# Patient Record
Sex: Female | Born: 1950 | Race: White | Hispanic: No | Marital: Single | Smoking: Former smoker
Health system: Southern US, Community
[De-identification: ages and names within clinical notes are randomized; demographics above are authoritative.]

## PROBLEM LIST (undated history)

## (undated) ENCOUNTER — Emergency Department (HOSPITAL_COMMUNITY): Payer: Medicare PPO | Source: Home / Self Care

## (undated) DIAGNOSIS — E785 Hyperlipidemia, unspecified: Secondary | ICD-10-CM

## (undated) DIAGNOSIS — K219 Gastro-esophageal reflux disease without esophagitis: Secondary | ICD-10-CM

## (undated) DIAGNOSIS — R51 Headache: Secondary | ICD-10-CM

## (undated) DIAGNOSIS — I2510 Atherosclerotic heart disease of native coronary artery without angina pectoris: Secondary | ICD-10-CM

## (undated) DIAGNOSIS — E119 Type 2 diabetes mellitus without complications: Secondary | ICD-10-CM

## (undated) DIAGNOSIS — R519 Headache, unspecified: Secondary | ICD-10-CM

## (undated) DIAGNOSIS — M869 Osteomyelitis, unspecified: Secondary | ICD-10-CM

## (undated) DIAGNOSIS — E049 Nontoxic goiter, unspecified: Secondary | ICD-10-CM

## (undated) DIAGNOSIS — G5603 Carpal tunnel syndrome, bilateral upper limbs: Secondary | ICD-10-CM

## (undated) DIAGNOSIS — J45909 Unspecified asthma, uncomplicated: Secondary | ICD-10-CM

## (undated) DIAGNOSIS — G4730 Sleep apnea, unspecified: Secondary | ICD-10-CM

## (undated) DIAGNOSIS — I480 Paroxysmal atrial fibrillation: Secondary | ICD-10-CM

## (undated) DIAGNOSIS — T4145XA Adverse effect of unspecified anesthetic, initial encounter: Secondary | ICD-10-CM

## (undated) DIAGNOSIS — R251 Tremor, unspecified: Secondary | ICD-10-CM

## (undated) DIAGNOSIS — Z8619 Personal history of other infectious and parasitic diseases: Secondary | ICD-10-CM

## (undated) DIAGNOSIS — N189 Chronic kidney disease, unspecified: Secondary | ICD-10-CM

## (undated) DIAGNOSIS — R9431 Abnormal electrocardiogram [ECG] [EKG]: Secondary | ICD-10-CM

## (undated) DIAGNOSIS — I10 Essential (primary) hypertension: Secondary | ICD-10-CM

## (undated) DIAGNOSIS — T8859XA Other complications of anesthesia, initial encounter: Secondary | ICD-10-CM

## (undated) DIAGNOSIS — J189 Pneumonia, unspecified organism: Secondary | ICD-10-CM

## (undated) DIAGNOSIS — M1990 Unspecified osteoarthritis, unspecified site: Secondary | ICD-10-CM

## (undated) DIAGNOSIS — E0590 Thyrotoxicosis, unspecified without thyrotoxic crisis or storm: Secondary | ICD-10-CM

## (undated) DIAGNOSIS — E1142 Type 2 diabetes mellitus with diabetic polyneuropathy: Secondary | ICD-10-CM

## (undated) HISTORY — PX: SHOULDER OPEN ROTATOR CUFF REPAIR: SHX2407

## (undated) HISTORY — DX: Personal history of other infectious and parasitic diseases: Z86.19

## (undated) HISTORY — DX: Abnormal electrocardiogram (ECG) (EKG): R94.31

## (undated) HISTORY — PX: KNEE ARTHROSCOPY: SHX127

## (undated) HISTORY — DX: Tremor, unspecified: R25.1

## (undated) HISTORY — DX: Paroxysmal atrial fibrillation: I48.0

## (undated) HISTORY — DX: Nontoxic goiter, unspecified: E04.9

## (undated) HISTORY — DX: Chronic kidney disease, unspecified: N18.9

## (undated) HISTORY — DX: Essential (primary) hypertension: I10

## (undated) HISTORY — DX: Hyperlipidemia, unspecified: E78.5

## (undated) HISTORY — PX: CARPAL TUNNEL RELEASE: SHX101

## (undated) HISTORY — DX: Unspecified osteoarthritis, unspecified site: M19.90

## (undated) HISTORY — PX: CARDIOVERSION: SHX1299

---

## 1974-05-10 HISTORY — PX: TONSILLECTOMY: SUR1361

## 1986-05-10 HISTORY — PX: ABDOMINAL HYSTERECTOMY: SHX81

## 1998-05-10 HISTORY — PX: THYROID SURGERY: SHX805

## 1998-05-10 HISTORY — PX: FOOT NEUROMA SURGERY: SHX646

## 2013-01-30 ENCOUNTER — Encounter: Payer: Self-pay | Admitting: Family Medicine

## 2013-01-30 ENCOUNTER — Ambulatory Visit: Payer: Self-pay | Admitting: Family Medicine

## 2013-01-30 VITALS — BP 147/77 | HR 82 | Temp 98.7°F | Resp 16 | Ht 64.0 in | Wt 172.0 lb

## 2013-01-30 DIAGNOSIS — Z720 Tobacco use: Secondary | ICD-10-CM

## 2013-01-30 DIAGNOSIS — E1149 Type 2 diabetes mellitus with other diabetic neurological complication: Secondary | ICD-10-CM

## 2013-01-30 DIAGNOSIS — E1140 Type 2 diabetes mellitus with diabetic neuropathy, unspecified: Secondary | ICD-10-CM

## 2013-01-30 DIAGNOSIS — E1151 Type 2 diabetes mellitus with diabetic peripheral angiopathy without gangrene: Secondary | ICD-10-CM | POA: Insufficient documentation

## 2013-01-30 DIAGNOSIS — F17200 Nicotine dependence, unspecified, uncomplicated: Secondary | ICD-10-CM

## 2013-01-30 DIAGNOSIS — I10 Essential (primary) hypertension: Secondary | ICD-10-CM

## 2013-01-30 LAB — POCT GLYCOSYLATED HEMOGLOBIN (HGB A1C): Hemoglobin A1C: 8.9

## 2013-01-30 MED ORDER — ATENOLOL-CHLORTHALIDONE 50-25 MG PO TABS: 1.0000 | ORAL_TABLET | Freq: Every day | ORAL | Status: DC

## 2013-01-30 MED ORDER — BUPROPION HCL ER (SR) 150 MG PO TB12: 150.0000 mg | ORAL_TABLET | Freq: Two times a day (BID) | ORAL | Status: DC

## 2013-01-30 MED ORDER — GLIMEPIRIDE 4 MG PO TABS: ORAL_TABLET | ORAL | Status: DC

## 2013-01-30 MED ORDER — GABAPENTIN 300 MG PO CAPS: 300.0000 mg | ORAL_CAPSULE | Freq: Three times a day (TID) | ORAL | Status: DC

## 2013-01-30 MED ORDER — FLUOXETINE HCL 20 MG PO CAPS: 20.0000 mg | ORAL_CAPSULE | Freq: Every day | ORAL | Status: DC

## 2013-01-30 MED ORDER — METFORMIN HCL 1000 MG PO TABS: 1000.0000 mg | ORAL_TABLET | Freq: Two times a day (BID) | ORAL | Status: DC

## 2013-01-30 MED ORDER — GLIMEPIRIDE 2 MG PO TABS: ORAL_TABLET | ORAL | Status: DC

## 2013-01-30 MED ORDER — TRAMADOL HCL 50 MG PO TABS: 50.0000 mg | ORAL_TABLET | Freq: Three times a day (TID) | ORAL | Status: DC | PRN

## 2013-01-30 NOTE — Progress Notes (Signed)
S:  This 62 y.o. Cauc female is a new pt who moved to [place] from [place] in 2012 or early  2013. She had no health insurance and relied upon previous physician to [facility] to refill medications; that physician (Dr. Marvis Moeller) encouraged pt to establish care with local practice. Pt has Type II DM on Metformin and Insulin; she has been otu of Lantus for several months but she has been using very small doses of short-acting Insulin. FSBS= "high 100s". She had 1 value= 325; this was when she decided not to take medication for 1 day. Nutrition is "okay" but not eating that well due to nausea. She is a chronic smoker and has a prod cough -thick creamy mucous; pt denies fever/chills, chest discomfort, wheezing, HA or weakness. She uses Albuterol MDI prn. Pt works as a Arboriculturist.  PMHx, Soc Hx and Fam Hx reviewed. Medications reconciled.  ROS: As per HPI.  O: Filed Vitals:   01/30/13 1433  BP: 147/77  Pulse: 82  Temp: 98.7 F (37.1 C)  Resp: 16   GEN: in NAD; WN,WD. HENT: [place]/AT; EOMI w/ clear conj/sclerae. EACs/TMs dull/scarred. Post ph erythematous w/o exudate or lesions. NECK: Supple w/o LAN or TMG. COR: RRR; normal S1 and S2. No m/g/r. LUNGS: Distant BS w/o wheezes, rales or rhonchi. SKIN: W&D; intact w/ ruddy facial complexion. No pallor. NEURO: A&O x 3; CNs intact. Nonfocal.  Results for orders placed in visit on 01/30/13  POCT GLYCOSYLATED HEMOGLOBIN (HGB A1C)      Result Value Range   Hemoglobin A1C 8.9       A/P: Type II or unspecified type diabetes mellitus without mention of complication, uncontrolled - Continue Metformin bid; add Glimepiride 2 mg 1/2 tablet bid before meals. Improve nutrition. Plan: POCT glycosylated hemoglobin (Hb A1C)  HTN (hypertension)- Stable; no medication change.  Diabetic neuropathy, painful- Refill medications; advised to stop smoking; reviewed disease processes connected to Diabetes and tobacco use.  Tobacco user- pt will try to cut back.  Meds  ordered this encounter  Medications  . DISCONTD: traMADol (ULTRAM) 50 MG tablet    Sig: Take 50 mg by mouth every 6 (six) hours as needed for pain.  Marland Kitchen DISCONTD: buPROPion (WELLBUTRIN SR) 150 MG 12 hr tablet    Sig: Take 150 mg by mouth 2 (two) times daily.  . cyclobenzaprine (FLEXERIL) 10 MG tablet    Sig: Take 10 mg by mouth 3 (three) times daily as needed for muscle spasms.  Marland Kitchen DISCONTD: gabapentin (NEURONTIN) 300 MG capsule    Sig: Take 300 mg by mouth 3 (three) times daily.  Marland Kitchen DISCONTD: atenolol-chlorthalidone (TENORETIC) 50-25 MG per tablet    Sig: Take 1 tablet by mouth daily.  Marland Kitchen DISCONTD: FLUoxetine (PROZAC) 20 MG capsule    Sig: Take 20 mg by mouth daily.  Marland Kitchen DISCONTD: metFORMIN (GLUCOPHAGE) 1000 MG tablet    Sig: Take 1,000 mg by mouth 2 (two) times daily with a meal.  . atenolol-chlorthalidone (TENORETIC) 50-25 MG per tablet    Sig: Take 1 tablet by mouth daily.    Dispense:  30 tablet    Refill:  3  . buPROPion (WELLBUTRIN SR) 150 MG 12 hr tablet    Sig: Take 1 tablet (150 mg total) by mouth 2 (two) times daily.    Dispense:  60 tablet    Refill:  3  . FLUoxetine (PROZAC) 20 MG capsule    Sig: Take 1 capsule (20 mg total) by mouth daily.    Dispense:  30 capsule    Refill:  3  . gabapentin (NEURONTIN) 300 MG capsule    Sig: Take 1 capsule (300 mg total) by mouth 3 (three) times daily.    Dispense:  90 capsule    Refill:  3  . metFORMIN (GLUCOPHAGE) 1000 MG tablet    Sig: Take 1 tablet (1,000 mg total) by mouth 2 (two) times daily with a meal.    Dispense:  60 tablet    Refill:  3  . DISCONTD: glimepiride (AMARYL) 4 MG tablet    Sig: Take 1/2 tablet twice a day before meals.    Dispense:  30 tablet    Refill:  3  . glimepiride (AMARYL) 2 MG tablet    Sig: Take 1/2 tablet twice a day before meals.    Dispense:  30 tablet    Refill:  3  . traMADol (ULTRAM) 50 MG tablet    Sig: Take 1 tablet (50 mg total) by mouth every 8 (eight) hours as needed for pain.     Dispense:  60 tablet    Refill:  1

## 2013-01-30 NOTE — Patient Instructions (Addendum)
Your Diabetes needs to be a little better controled. Try to eat as healthy as you can and continue taking the medications. I have added Glimepiride 2 mg tablets for you to take before meals to help control your blood sugar; take 1/2 tablet before 2 main meals. Do not use the Insulin unless your sugar is over 250. I will see you again in 4 months for Diabetes check and complete physical exam. All your medications have been refilled. Contact the office if you have any problems.

## 2013-04-18 ENCOUNTER — Other Ambulatory Visit: Payer: Self-pay | Admitting: Family Medicine

## 2013-04-19 NOTE — Telephone Encounter (Signed)
Tramadol refill- prescription printed out and will be available at [facility] for pick up.

## 2013-04-20 ENCOUNTER — Telehealth: Payer: Self-pay

## 2013-04-20 NOTE — Telephone Encounter (Signed)
Pt faxed note to Dr Audria Nine checking on RF of Tramadol she had requested through pharm. Our records showed that it was sent yesterday, but called pharm and they did not have a record of the Rx. I gave RF info over the phone and [facility] for pt that was done.

## 2013-05-29 ENCOUNTER — Other Ambulatory Visit: Payer: Self-pay | Admitting: Family Medicine

## 2013-05-30 NOTE — Telephone Encounter (Signed)
Tramadol HCl 50 mg  #60 w/ no refills phoned to pt's pharmacy.

## 2013-06-01 ENCOUNTER — Encounter: Payer: Self-pay | Admitting: Family Medicine

## 2013-06-01 ENCOUNTER — Ambulatory Visit (INDEPENDENT_AMBULATORY_CARE_PROVIDER_SITE_OTHER): Payer: BC Managed Care – PPO | Admitting: Family Medicine

## 2013-06-01 VITALS — BP 138/80 | HR 68 | Temp 97.9°F | Resp 16 | Ht 63.5 in | Wt 164.4 lb

## 2013-06-01 DIAGNOSIS — F418 Other specified anxiety disorders: Secondary | ICD-10-CM | POA: Insufficient documentation

## 2013-06-01 DIAGNOSIS — F341 Dysthymic disorder: Secondary | ICD-10-CM

## 2013-06-01 DIAGNOSIS — E1165 Type 2 diabetes mellitus with hyperglycemia: Secondary | ICD-10-CM

## 2013-06-01 DIAGNOSIS — IMO0001 Reserved for inherently not codable concepts without codable children: Secondary | ICD-10-CM

## 2013-06-01 DIAGNOSIS — Z0000 Encounter for general adult medical examination without abnormal findings: Secondary | ICD-10-CM

## 2013-06-01 DIAGNOSIS — I10 Essential (primary) hypertension: Secondary | ICD-10-CM

## 2013-06-01 DIAGNOSIS — Z8619 Personal history of other infectious and parasitic diseases: Secondary | ICD-10-CM

## 2013-06-01 DIAGNOSIS — Z1231 Encounter for screening mammogram for malignant neoplasm of breast: Secondary | ICD-10-CM

## 2013-06-01 HISTORY — DX: Personal history of other infectious and parasitic diseases: Z86.19

## 2013-06-01 LAB — CBC WITH DIFFERENTIAL/PLATELET
BASOS ABS: 0.1 10*3/uL (ref 0.0–0.1)
Basophils Relative: 1 % (ref 0–1)
EOS ABS: 0.3 10*3/uL (ref 0.0–0.7)
Eosinophils Relative: 3 % (ref 0–5)
HCT: 38.1 % (ref 36.0–46.0)
Hemoglobin: 13 g/dL (ref 12.0–15.0)
LYMPHS PCT: 30 % (ref 12–46)
Lymphs Abs: 2.5 10*3/uL (ref 0.7–4.0)
MCH: 28.4 pg (ref 26.0–34.0)
MCHC: 34.1 g/dL (ref 30.0–36.0)
MCV: 83.2 fL (ref 78.0–100.0)
Monocytes Absolute: 0.5 10*3/uL (ref 0.1–1.0)
Monocytes Relative: 6 % (ref 3–12)
NEUTROS PCT: 60 % (ref 43–77)
Neutro Abs: 5.1 10*3/uL (ref 1.7–7.7)
PLATELETS: 426 10*3/uL — AB (ref 150–400)
RBC: 4.58 MIL/uL (ref 3.87–5.11)
RDW: 14.1 % (ref 11.5–15.5)
WBC: 8.5 10*3/uL (ref 4.0–10.5)

## 2013-06-01 LAB — POCT URINALYSIS DIPSTICK
GLUCOSE UA: NEGATIVE
Leukocytes, UA: NEGATIVE
Nitrite, UA: NEGATIVE
PROTEIN UA: 100
Spec Grav, UA: 1.02
Urobilinogen, UA: 1
pH, UA: 7.5

## 2013-06-01 LAB — POCT GLYCOSYLATED HEMOGLOBIN (HGB A1C): HEMOGLOBIN A1C: 8.8

## 2013-06-01 LAB — IFOBT (OCCULT BLOOD): IMMUNOLOGICAL FECAL OCCULT BLOOD TEST: POSITIVE

## 2013-06-01 MED ORDER — GLIMEPIRIDE 2 MG PO TABS: ORAL_TABLET | ORAL | Status: DC

## 2013-06-01 MED ORDER — ATENOLOL-CHLORTHALIDONE 50-25 MG PO TABS: ORAL_TABLET | ORAL | Status: DC

## 2013-06-01 MED ORDER — GABAPENTIN 300 MG PO CAPS: ORAL_CAPSULE | ORAL | Status: DC

## 2013-06-01 MED ORDER — BUPROPION HCL ER (SR) 150 MG PO TB12: ORAL_TABLET | ORAL | Status: DC

## 2013-06-01 MED ORDER — METFORMIN HCL 1000 MG PO TABS: ORAL_TABLET | ORAL | Status: DC

## 2013-06-01 MED ORDER — FLUOXETINE HCL 20 MG PO CAPS: ORAL_CAPSULE | ORAL | Status: DC

## 2013-06-01 MED ORDER — TRAMADOL HCL 50 MG PO TABS: ORAL_TABLET | ORAL | Status: DC

## 2013-06-01 MED ORDER — ZOSTER VACCINE LIVE 19400 UNT/0.65ML ~~LOC~~ SOLR: 0.6500 mL | Freq: Once | SUBCUTANEOUS | Status: DC

## 2013-06-01 NOTE — Progress Notes (Signed)
Subjective:    Patient ID: Judith [name], female    DOB: [birth year], 63 y.o.   MRN: [id number]  HPI  This 63 y.o. 47 female is here for CPE; chronic medical problems include Type II DM, HTN, depression/anxiety and neuropathy. She states she feels so much better since last visit when medications were resumed. Pt is compliant w/ all meds. She has a hx of  3 episodes of Shingles; now she has a persistent mild tingling to L of spine. Has never received the vaccine.  HCM: PAP- s/p TAH for benign reasons           MMG- 2011 or 2012 (negative in [place], [place]).           CRS- Current; has polyps.           Vision- 2014; wears corrective lenses.  Patient Active Problem List   Diagnosis Date Noted  . History of shingles 06/01/2013  . Depression with anxiety 06/01/2013  . Type II or unspecified type diabetes mellitus without mention of complication, uncontrolled 01/30/2013  . HTN (hypertension) 01/30/2013  . Diabetic neuropathy, painful 01/30/2013  . Tobacco user 01/30/2013   PMHx, Surg Hx, Soc and Fam hx reviewed.  MEDICATIONS reconciled.  Review of Systems  Constitutional: Negative.   HENT: Positive for dental problem, mouth sores and trouble swallowing. Negative for hearing loss and voice change.        Pt attributes weight loss to dental problems and trouble chewing.  Eyes: Negative.   Respiratory: Negative.   Cardiovascular: Negative.        Pt c/o heartburn.  Gastrointestinal: Negative.   Endocrine: Negative for polydipsia, polyphagia and polyuria.       Has a goiter.  Genitourinary: Negative.        Vaginal discomfort due to dryness.  Musculoskeletal: Negative.   Skin: Negative.   Allergic/Immunologic: Negative.   Neurological: Negative.   Hematological: Negative.   Psychiatric/Behavioral: Negative for suicidal ideas, behavioral problems, sleep disturbance, dysphoric mood, decreased concentration and agitation. The patient is nervous/anxious.       Objective:   Physical  Exam  Nursing note and vitals reviewed. Constitutional: She is oriented to person, place, and time. Vital signs are normal. She appears well-developed and well-nourished. No distress.  HENT:  Head: Normocephalic and atraumatic.  Right Ear: Hearing, tympanic membrane, external ear and ear canal normal.  Left Ear: Hearing, tympanic membrane, external ear and ear canal normal.  Nose: Nose normal. No nasal deformity or septal deviation.  Mouth/Throat: Uvula is midline, oropharynx is clear and moist and mucous membranes are normal. No oral lesions. Abnormal dentition. No dental abscesses.  Eyes: EOM and lids are normal. Pupils are equal, round, and reactive to light. Right conjunctiva is not injected. Left conjunctiva is injected. No scleral icterus.  L> R eye- infraorbital area is puffy; L conjunctival area is erythematous.  Neck: Trachea normal, normal range of motion and full passive range of motion without pain. Neck supple. No JVD present. No spinous process tenderness and no muscular tenderness present. Carotid bruit is not present. Thyromegaly present. No mass present.  Cardiovascular: Normal rate, regular rhythm, S1 normal, S2 normal, normal heart sounds and intact distal pulses.   No extrasystoles are present. PMI is not displaced.  Exam reveals no gallop, no distant heart sounds and no friction rub.   No murmur heard. Pulmonary/Chest: Effort normal. No respiratory distress. She has decreased breath sounds. She has no wheezes. She has no rhonchi. She has no  rales. Right breast exhibits no inverted nipple, no mass, no nipple discharge, no skin change and no tenderness. Left breast exhibits no inverted nipple, no mass, no nipple discharge, no skin change and no tenderness. Breasts are symmetrical.  Decreased BS at bases. Barrel chest shape. Atrophic breast tissue.  Abdominal: Soft. Normal appearance, normal aorta and bowel sounds are normal. She exhibits no distension, no abdominal bruit, no  pulsatile midline mass and no mass. There is no hepatosplenomegaly. There is no tenderness. There is no guarding and no CVA tenderness. No hernia.  Genitourinary: Rectal exam shows external hemorrhoid. Rectal exam shows no fissure, no mass, no tenderness and anal tone normal. There is no rash, tenderness or lesion on the right labia. There is no rash, tenderness or lesion on the left labia.  Lymphadenopathy:       Head (right side): No submental, no submandibular, no tonsillar, no preauricular, no posterior auricular and no occipital adenopathy present.       Head (left side): No submental, no submandibular, no tonsillar, no preauricular, no posterior auricular and no occipital adenopathy present.    She has no cervical adenopathy.    She has no axillary adenopathy.       Right: No inguinal and no supraclavicular adenopathy present.       Left: No inguinal and no supraclavicular adenopathy present.  Neurological: She is alert and oriented to person, place, and time. She has normal strength and normal reflexes. She displays no atrophy and no tremor. No cranial nerve deficit or sensory deficit. She exhibits normal muscle tone. Coordination and gait normal.  Skin: Skin is warm, dry and intact. No ecchymosis, no lesion and no rash noted. She is not diaphoretic. No cyanosis or erythema. No pallor.  Psychiatric: Her speech is normal and behavior is normal. Judgment and thought content normal. Her mood appears anxious. Her affect is not labile and not inappropriate. Cognition and memory are normal. She does not exhibit a depressed mood.    ECG: NSR; no ST-TW changes . No ectopy.  Results for orders placed in visit on 06/01/13  POCT URINALYSIS DIPSTICK      Result Value Range   Color, UA yellow     Clarity, UA clear     Glucose, UA neg     Bilirubin, UA small     Ketones, UA trace     Spec Grav, UA 1.020     Blood, UA trace     pH, UA 7.5     Protein, UA 100     Urobilinogen, UA 1.0     Nitrite,  UA neg     Leukocytes, UA Negative    IFOBT (OCCULT BLOOD)      Result Value Range   IFOBT Positive    POCT GLYCOSYLATED HEMOGLOBIN (HGB A1C)      Result Value Range   Hemoglobin A1C 8.8        Assessment & Plan:  Routine general medical examination at a health care facility - Plan: POCT urinalysis dipstick, IFOBT POC (occult bld, rslt in office), Hepatitis C antibody, CBC with Differential, T4, Free, EKG 12-Lead, Lipid panel  Type II or unspecified type diabetes mellitus without mention of complication, uncontrolled - Continue current medications; pt will focus on nutrition and physical activity. Plan: POCT glycosylated hemoglobin (Hb A1C), COMPLETE METABOLIC PANEL WITH GFR  HTN (hypertension) - Stable on current medication. Continue same. Plan: TSH  Depression with anxiety- Stable on current medications; continue same.  History of shingles -  Plan: T4, Free, zoster vaccine live, PF, (ZOSTAVAX) 60454 UNT/0.65ML injection  Other screening mammogram - Plan: MM Digital Screening  Meds ordered this encounter  Medications  . atenolol-chlorthalidone (TENORETIC) 50-25 MG per tablet    Sig: TAKE ONE TABLET BY MOUTH ONCE DAILY    Dispense:  30 tablet    Refill:  5  . buPROPion (WELLBUTRIN SR) 150 MG 12 hr tablet    Sig: TAKE ONE TABLET BY MOUTH TWICE DAILY    Dispense:  60 tablet    Refill:  5  . FLUoxetine (PROZAC) 20 MG capsule    Sig: TAKE ONE CAPSULE BY MOUTH ONCE DAILY    Dispense:  30 capsule    Refill:  5  . gabapentin (NEURONTIN) 300 MG capsule    Sig: TAKE ONE CAPSULE BY MOUTH THREE TIMES DAILY    Dispense:  90 capsule    Refill:  5  . glimepiride (AMARYL) 2 MG tablet    Sig: Take 1/2 tablet twice a day before meals.    Dispense:  30 tablet    Refill:  5  . metFORMIN (GLUCOPHAGE) 1000 MG tablet    Sig: TAKE ONE TABLET BY MOUTH TWICE DAILY WITH MEALS    Dispense:  60 tablet    Refill:  5  . traMADol (ULTRAM) 50 MG tablet    Sig: TAKE ONE TABLET BY MOUTH EVERY 8  HOURS AS NEEDED FOR PAIN    Dispense:  60 tablet    Refill:  1  . zoster vaccine live, PF, (ZOSTAVAX) 09811 UNT/0.65ML injection    Sig: Inject 19,400 Units into the skin once.    Dispense:  1 each    Refill:  0

## 2013-06-01 NOTE — Patient Instructions (Signed)
Keeping You Healthy  Get These Tests  Blood Pressure- Have your blood pressure checked by your healthcare provider at least once a year.  Normal blood pressure is 120/80.  Weight- Have your body mass index (BMI) calculated to screen for obesity.  BMI is a measure of body fat based on height and weight.  You can calculate your own BMI at www.nhlbisupport.com/bmi/  Cholesterol- Have your cholesterol checked every year.  Diabetes- Have your blood sugar checked every year if you have high blood pressure, high cholesterol, a family history of diabetes or if you are overweight.  Pap Smear- Have a pap smear every 1 to 3 years if you have been sexually active.  If you are older than 65 and recent pap smears have been normal you may not need additional pap smears.  In addition, if you have had a hysterectomy  For benign disease additional pap smears are not necessary.  Mammogram-Yearly mammograms are essential for early detection of breast cancer  Screening for Colon Cancer- Colonoscopy starting at age 50. Screening may begin sooner depending on your family history and other health conditions.  Follow up colonoscopy as directed by your Gastroenterologist.  Screening for Osteoporosis- Screening begins at age 65 with bone density scanning, sooner if you are at higher risk for developing Osteoporosis.  Get these medicines  Calcium with Vitamin D- Your body requires 1200-1500 mg of Calcium a day and 800-1000 IU of Vitamin D a day.  You can only absorb 500 mg of Calcium at a time therefore Calcium must be taken in 2 or 3 separate doses throughout the day.  Hormones- Hormone therapy has been associated with increased risk for certain cancers and heart disease.  Talk to your healthcare provider about if you need relief from menopausal symptoms.  Aspirin- Ask your healthcare provider about taking Aspirin to prevent Heart Disease and Stroke.  Get these Immuniztions  Flu shot- Every fall  Pneumonia  shot- Once after the age of 65; if you are younger ask your healthcare provider if you need a pneumonia shot.  Tetanus- Every ten years.  Zostavax- Once after the age of 60 to prevent shingles.  Take these steps  Don't smoke- Your healthcare provider can help you quit. For tips on how to quit, ask your healthcare provider or go to www.smokefree.gov or call 1-800 QUIT-NOW.  Be physically active- Exercise 5 days a week for a minimum of 30 minutes.  If you are not already physically active, start slow and gradually work up to 30 minutes of moderate physical activity.  Try walking, dancing, bike riding, swimming, etc.  Eat a healthy diet- Eat a variety of healthy foods such as fruits, vegetables, whole grains, low fat milk, low fat cheeses, yogurt, lean meats, chicken, fish, eggs, dried beans, tofu, etc.  For more information go to www.thenutritionsource.org  Dental visit- Brush and floss teeth twice daily; visit your dentist twice a year.  Eye exam- Visit your Optometrist or Ophthalmologist yearly.  Drink alcohol in moderation- Limit alcohol intake to one drink or less a day.  Never drink and drive.  Depression- Your emotional health is as important as your physical health.  If you're feeling down or losing interest in things you normally enjoy, please talk to your healthcare provider.  Seat Belts- can save your life; always wear one  Smoke/Carbon Monoxide detectors- These detectors need to be installed on the appropriate level of your home.  Replace batteries at least once a year.  Violence- If anyone   is threatening or hurting you, please tell your healthcare provider.  Living Will/ Health care power of attorney- Discuss with your healthcare provider and family. 

## 2013-06-02 LAB — COMPLETE METABOLIC PANEL WITH GFR
ALT: 8 U/L (ref 0–35)
AST: 10 U/L (ref 0–37)
Albumin: 4 g/dL (ref 3.5–5.2)
Alkaline Phosphatase: 85 U/L (ref 39–117)
BILIRUBIN TOTAL: 0.4 mg/dL (ref 0.3–1.2)
BUN: 22 mg/dL (ref 6–23)
CHLORIDE: 98 meq/L (ref 96–112)
CO2: 31 mEq/L (ref 19–32)
CREATININE: 0.79 mg/dL (ref 0.50–1.10)
Calcium: 9.3 mg/dL (ref 8.4–10.5)
GFR, Est African American: >89 mL/min
GFR, Est Non African American: 82 mL/min
Glucose, Bld: 124 mg/dL — ABNORMAL HIGH (ref 70–99)
Potassium: 4.4 mEq/L (ref 3.5–5.3)
Sodium: 138 mEq/L (ref 135–145)
Total Protein: 7.2 g/dL (ref 6.0–8.3)

## 2013-06-02 LAB — LIPID PANEL
Cholesterol: 190 mg/dL (ref 0–200)
HDL: 35 mg/dL — AB (ref >39)
LDL CALC: 83 mg/dL (ref 0–99)
Total CHOL/HDL Ratio: 5.4 Ratio
Triglycerides: 358 mg/dL — ABNORMAL HIGH (ref <150)
VLDL: 72 mg/dL — AB (ref 0–40)

## 2013-06-02 LAB — TSH: TSH: 0.749 u[IU]/mL (ref 0.350–4.500)

## 2013-06-02 LAB — T4, FREE: FREE T4: 1.27 ng/dL (ref 0.80–1.80)

## 2013-06-02 LAB — HEPATITIS C ANTIBODY: HCV Ab: NEGATIVE

## 2013-06-05 NOTE — Progress Notes (Signed)
Quick Note:  Please advise pt regarding following labs...  Lipid panel-- Total cholesterol is normal. Triglycerides are too high and related to uncontrolled Diabetes. Improved nutrition would bring the triglycerides down; THE MEDITERRANEAN DIET is a good guide for Heart Healthy eating. Staying active will help raise HDL ("good") cholesterol.  Thyroid blood tests are normal. Complete blood counts are normal. You do not have the antibody for Hepatitis C. Sodium, potassium, calcium, kidney and liver tests are all normal.   Copy to pt. ______

## 2013-06-10 ENCOUNTER — Telehealth: Payer: Self-pay

## 2013-06-10 NOTE — Telephone Encounter (Signed)
PT says she thinks we have been calling her.  She has had three missed calls, but does not know how to check her messages.  Please call [phone]

## 2013-07-04 ENCOUNTER — Ambulatory Visit (INDEPENDENT_AMBULATORY_CARE_PROVIDER_SITE_OTHER): Payer: BC Managed Care – PPO | Admitting: Family Medicine

## 2013-07-04 VITALS — BP 154/92 | HR 90 | Temp 98.3°F | Resp 18 | Ht 64.0 in | Wt 169.0 lb

## 2013-07-04 DIAGNOSIS — R6884 Jaw pain: Secondary | ICD-10-CM

## 2013-07-04 DIAGNOSIS — I209 Angina pectoris, unspecified: Secondary | ICD-10-CM

## 2013-07-04 DIAGNOSIS — R21 Rash and other nonspecific skin eruption: Secondary | ICD-10-CM

## 2013-07-04 DIAGNOSIS — L299 Pruritus, unspecified: Secondary | ICD-10-CM

## 2013-07-04 MED ORDER — TRIAMCINOLONE ACETONIDE 0.1 % EX CREA: 1.0000 "application " | TOPICAL_CREAM | Freq: Two times a day (BID) | CUTANEOUS | Status: DC

## 2013-07-04 NOTE — Patient Instructions (Signed)
Try an OTC mouth guard at night for your jaw.  Let me know if it is getting worse!  We will refer you to cardiology to further evaluate your angina.  If you have any worsening or other changes in your angina please me know  You may use the triamcinolone cream twice a day as needed- ok to mix with Eucerin or another moisturizer creams.    Let me know if you are not better with your rash or jaw pain in about one week- Sooner if worse. We can try some prednisone for the rash if we have to

## 2013-07-04 NOTE — Progress Notes (Signed)
[facility] [address] [place] [address] [phone]  Date:  07/04/2013   Name:  Judith [name]   DOB:  05-18-1950   MRN:  [id number]  PCP:  No primary provider on file.    Chief Complaint: Rash and Facial Pain   History of Present Illness:  Judith [name] is a 63 y.o. very pleasant female patient who presents with the following:  She has noted an itchy rash on her trunk and scalp which has been present for about 3 weeks.   She has had this intermittently for about 15 years.  Usually it is more mild and will go away.  She has had a punch bx a couple of times in the times but never had a definite diagnosis.  It is itchy and she is using benadryl- she uses it at night.   She did get some fluocinonide from her sister which does not seem to help  She also notes pain in her left face and jaw- it will go away if she sits still.  It may occur several times a day.  Changing temperature (like going inside to outside) or eating or drinking causes the pain.  She does not feel that she has definite sinus congestion, no fever, no nasal discharge.  The pain does not wake her up but she does take gabapetin and tramadol before bed.    Her most recent A1c was 8.8  She does have a history of possible angina.  She uses nitrogylcerin prn.  2 years ago she "was having the worst indigestion of all time." She went to the ER and was diagnosed with angina- however she had a myoview and it was ok.  She did not have a cardiac cath. She is not currently seeing a cardiologist  Patient Active Problem List   Diagnosis Date Noted  . History of shingles 06/01/2013  . Depression with anxiety 06/01/2013  . Type II or unspecified type diabetes mellitus without mention of complication, uncontrolled 01/30/2013  . HTN (hypertension) 01/30/2013  . Diabetic neuropathy, painful 01/30/2013  . Tobacco user 01/30/2013    Past Medical History  Diagnosis Date  . Diabetes mellitus without  complication   . Hypertension   . Goiter   . Chronic kidney disease   . Allergy   . Arthritis   . Neuropathy     Past Surgical History  Procedure Laterality Date  . Tonsillectomy    . Cesarean section    . Abdominal hysterectomy    . Carpal tunnel release    . Right knee    . Right shoulder    . Foot neuroma surgery      History  Substance Use Topics  . Smoking status: Current Every Day Smoker  . Smokeless tobacco: Not on file  . Alcohol Use: No    Family History  Problem Relation Age of Onset  . Cancer Mother     bronchial cancer  . Hypertension Father   . COPD Father   . Allergies Sister   . Breast cancer Maternal Grandmother   . Emphysema Maternal Grandfather   . Leukemia Paternal Grandmother   . Emphysema Paternal Grandfather     Allergies  Allergen Reactions  . Iodine   . Neosporin [Neomycin-Bacitracin Zn-Polymyx]   . Penicillins   . Tape     Medication list has been reviewed and updated.  Current Outpatient Prescriptions on File Prior to Visit  Medication Sig Dispense Refill  . atenolol-chlorthalidone (TENORETIC)  50-25 MG per tablet TAKE ONE TABLET BY MOUTH ONCE DAILY  30 tablet  5  . buPROPion (WELLBUTRIN SR) 150 MG 12 hr tablet TAKE ONE TABLET BY MOUTH TWICE DAILY  60 tablet  5  . cyclobenzaprine (FLEXERIL) 10 MG tablet Take 10 mg by mouth 3 (three) times daily as needed for muscle spasms.      Marland Kitchen FLUoxetine (PROZAC) 20 MG capsule TAKE ONE CAPSULE BY MOUTH ONCE DAILY  30 capsule  5  . gabapentin (NEURONTIN) 300 MG capsule TAKE ONE CAPSULE BY MOUTH THREE TIMES DAILY  90 capsule  5  . glimepiride (AMARYL) 2 MG tablet Take 1/2 tablet twice a day before meals.  30 tablet  5  . metFORMIN (GLUCOPHAGE) 1000 MG tablet TAKE ONE TABLET BY MOUTH TWICE DAILY WITH MEALS  60 tablet  5  . traMADol (ULTRAM) 50 MG tablet TAKE ONE TABLET BY MOUTH EVERY 8 HOURS AS NEEDED FOR PAIN  60 tablet  1  . zoster vaccine live, PF, (ZOSTAVAX) 74081 UNT/0.65ML injection Inject  19,400 Units into the skin once.  1 each  0   No current facility-administered medications on file prior to visit.    Review of Systems:  As per HPI- otherwise negative.   Physical Examination: Filed Vitals:   07/04/13 0850  BP: 154/92  Pulse: 90  Temp: 98.3 F (36.8 C)  Resp: 18   Filed Vitals:   07/04/13 0850  Height: 5\' 4"  (1.626 m)  Weight: 169 lb (76.658 kg)   Body mass index is 28.99 kg/(m^2). Ideal Body Weight: Weight in (lb) to have BMI = 25: 145.3  GEN: WDWN, NAD, Non-toxic, A & O x 3, overweight, looks well HEENT: Atraumatic, Normocephalic. Neck supple. No masses, No LAD.  Bilateral TM wnl, oropharynx normal.  PEERL,EOMI.  She is tender over her left TMJ.  She has pain with opening and closing her mouth. She does not have teeth in the posterior part of her jaw- no tenderness over the gums Ears and Nose: No external deformity. CV: RRR, No M/G/R. No JVD. No thrill. No extra heart sounds. PULM: CTA B, no wheezes, crackles, rhonchi. No retractions. No resp. distress. No accessory muscle use. ABD: S, NT, ND, +BS. No rebound. No HSM. EXTR: No c/c/e NEURO Normal gait.  PSYCH: Normally interactive. Conversant. Not depressed or anxious appearing.  Calm demeanor.  She has a diffuse, erythematous rash around her waistline and on her back.  No urticaria.  Fine, palpable.  No vesicles or lesions.  No rash on her scalp to suggest shingles, no tenderness in the temporal artery distribution   EKG: no acute change when compared to EKG from last month, no ST elevation or depression.  She does have poor R wave progression in the limb leads Assessment and Plan: Jaw pain - Plan: EKG 12-Lead  Rash and nonspecific skin eruption - Plan: triamcinolone cream (KENALOG) 0.1 %  Itching  Anginal pain - Plan: Ambulatory referral to Cardiology  Suspect TMJ is causing her jaw pain She does have flexeril at home- she can try taking this as needed.  She does take some aleve sometimes.   Will  refer to cardiology to follow-up stable angina. Her jaw pain today is not angina Triamcinolone OTC as needed for rash See patient instructions for more details.    Signed Lamar Blinks, MD

## 2013-07-31 ENCOUNTER — Encounter: Payer: Self-pay | Admitting: Cardiology

## 2013-07-31 ENCOUNTER — Ambulatory Visit (INDEPENDENT_AMBULATORY_CARE_PROVIDER_SITE_OTHER): Payer: BC Managed Care – PPO | Admitting: Cardiology

## 2013-07-31 VITALS — BP 129/72 | HR 90 | Ht 64.0 in | Wt 169.0 lb

## 2013-07-31 DIAGNOSIS — R9431 Abnormal electrocardiogram [ECG] [EKG]: Secondary | ICD-10-CM

## 2013-07-31 DIAGNOSIS — R079 Chest pain, unspecified: Secondary | ICD-10-CM | POA: Insufficient documentation

## 2013-07-31 DIAGNOSIS — I10 Essential (primary) hypertension: Secondary | ICD-10-CM

## 2013-07-31 DIAGNOSIS — K219 Gastro-esophageal reflux disease without esophagitis: Secondary | ICD-10-CM | POA: Insufficient documentation

## 2013-07-31 HISTORY — DX: Abnormal electrocardiogram (ECG) (EKG): R94.31

## 2013-07-31 MED ORDER — LANSOPRAZOLE 15 MG PO CPDR: 15.0000 mg | DELAYED_RELEASE_CAPSULE | Freq: Every day | ORAL | Status: DC

## 2013-07-31 NOTE — Progress Notes (Signed)
[address], [place], [place]  [address] Phone: [phone] Fax:  [phone]  Date:  07/31/2013   ID:  Judith [name], DOB [birth year], MRN [id number]  PCP:  No primary provider on file.  Cardiologist:  Fransico Him, MD     History of Present Illness: Judith [name] is a 63 y.o. female with a histor yof DM and HTN who presents today for evaluation of chest pan jaw pain.  She has a history of CP in the past with normal stress myoview.  Apparently a few years ago she was having bad indigestion and was seen in the ER and was admitted and was seen by Cardiology and was given SL NTG to take PRN.  She has had increasing bouts of indigestion that gets worse around 12 noon.  She says it is burning but also a pain that she has a hard time describing.  She denies any SOB, nausea or diaphoresis.  The discomfort is midsternal and does not radiate.  She takes the Prevacid and sometimes it helps and if it doesn't she takes the SL NTG and that relieves the pain.  The discomfort occurs intermittenly throughout the week but not daily.  She has been having DOE due to her smoking since age 71 and it has not worsened.  She denies any dizziness or palpitations.   Wt Readings from Last 3 Encounters:  07/04/13 169 lb (76.658 kg)  06/01/13 164 lb 6.4 oz (74.571 kg)  01/30/13 172 lb (78.019 kg)     Past Medical History  Diagnosis Date  . Diabetes mellitus without complication   . Hypertension   . Goiter   . Chronic kidney disease   . Allergy   . Arthritis   . Neuropathy     Current Outpatient Prescriptions  Medication Sig Dispense Refill  . atenolol-chlorthalidone (TENORETIC) 50-25 MG per tablet TAKE ONE TABLET BY MOUTH ONCE DAILY  30 tablet  5  . buPROPion (WELLBUTRIN SR) 150 MG 12 hr tablet TAKE ONE TABLET BY MOUTH TWICE DAILY  60 tablet  5  . cyclobenzaprine (FLEXERIL) 10 MG tablet Take 10 mg by mouth 3 (three) times daily as needed for muscle spasms.      Marland Kitchen FLUoxetine (PROZAC) 20 MG capsule  TAKE ONE CAPSULE BY MOUTH ONCE DAILY  30 capsule  5  . gabapentin (NEURONTIN) 300 MG capsule TAKE ONE CAPSULE BY MOUTH THREE TIMES DAILY  90 capsule  5  . glimepiride (AMARYL) 2 MG tablet Take 1/2 tablet twice a day before meals.  30 tablet  5  . metFORMIN (GLUCOPHAGE) 1000 MG tablet TAKE ONE TABLET BY MOUTH TWICE DAILY WITH MEALS  60 tablet  5  . traMADol (ULTRAM) 50 MG tablet TAKE ONE TABLET BY MOUTH EVERY 8 HOURS AS NEEDED FOR PAIN  60 tablet  1  . triamcinolone cream (KENALOG) 0.1 % Apply 1 application topically 2 (two) times daily. May mix 1:1 with eurerin cream at home  454 g  0  . zoster vaccine live, PF, (ZOSTAVAX) 34742 UNT/0.65ML injection Inject 19,400 Units into the skin once.  1 each  0   No current facility-administered medications for this visit.    Allergies:    Allergies  Allergen Reactions  . Iodine   . Neosporin [Neomycin-Bacitracin Zn-Polymyx]   . Penicillins   . Tape     Social History:  The patient  reports that she has been smoking.  She does not have any smokeless tobacco history on file. She  reports that she does not drink alcohol or use illicit drugs.   Family History:  The patient's family history includes Allergies in her sister; Breast cancer in her maternal grandmother; COPD in her father; Cancer in her mother; Emphysema in her maternal grandfather and paternal grandfather; Hypertension in her father; Leukemia in her paternal grandmother.   ROS:  Please see the history of present illness.      All other systems reviewed and negative.   PHYSICAL EXAM: VS:  There were no vitals taken for this visit. Well nourished, well developed, in no acute distress HEENT: normal Neck: no JVD Cardiac:  normal S1, S2; RRR; no murmur Lungs:  clear to auscultation bilaterally, no wheezing, rhonchi or rales Abd: soft, nontender, no hepatomegaly Ext: no edema Skin: warm and dry Neuro:  CNs 2-12 intact, no focal abnormalities noted  EKG:     NSR with anterior infarct by  EKG 06/2013  ASSESSMENT AND PLAN:  1.  Chest pain with multiple cardiac risk factors including DM, HTN and ongoing tobacco use and EKG with loss of anterior forces in V1-V4 - stress myoview to assess for ischemia - 2D echo to assess LVF 2.  HTN - well controlled - continue Tenoretic 3.  DM 4.  GERD - I have instructed he to take her OTC Prevacid daily for now  Followup with m in 4 weeks  Signed, Fransico Him, MD 07/31/2013 11:41 AM

## 2013-07-31 NOTE — Patient Instructions (Signed)
Your physician has recommended you make the following change in your medication: 1. Take Prevacid OTC 1 tablet daily  Your physician has requested that you have an echocardiogram. Echocardiography is a painless test that uses sound waves to create images of your heart. It provides your doctor with information about the size and shape of your heart and how well your heart's chambers and valves are working. This procedure takes approximately one hour. There are no restrictions for this procedure.  Your physician has requested that you have an exercise stress myoview. For further information please visit [email or web address]. Please follow instruction sheet, as given.  Your physician recommends that you schedule a follow-up appointment in: 4 Weeks with Dr Radford Pax

## 2013-08-03 ENCOUNTER — Other Ambulatory Visit: Payer: Self-pay

## 2013-08-03 NOTE — Telephone Encounter (Signed)
Dr Leward Quan, pharm reqs RF of cyclobenzaprine 10 mg QD #30. I can't tell if you Rxd this for pt at Sept OV or not. One place it looks like you ordered it and the other this med is not listed. Do you want to give pt RFs?

## 2013-08-04 MED ORDER — CYCLOBENZAPRINE HCL 10 MG PO TABS: 10.0000 mg | ORAL_TABLET | Freq: Every day | ORAL | Status: DC

## 2013-08-04 NOTE — Telephone Encounter (Signed)
I authorized refill for Cyclobenzaprine  #30 w/ 2 RFs.

## 2013-08-09 ENCOUNTER — Encounter (HOSPITAL_COMMUNITY): Payer: BC Managed Care – PPO

## 2013-08-15 ENCOUNTER — Encounter: Payer: Self-pay | Admitting: Cardiovascular Disease

## 2013-08-15 ENCOUNTER — Ambulatory Visit (HOSPITAL_COMMUNITY): Payer: BC Managed Care – PPO | Attending: Cardiovascular Disease | Admitting: Radiology

## 2013-08-15 DIAGNOSIS — R079 Chest pain, unspecified: Secondary | ICD-10-CM | POA: Insufficient documentation

## 2013-08-15 DIAGNOSIS — R072 Precordial pain: Secondary | ICD-10-CM

## 2013-08-15 NOTE — Progress Notes (Signed)
Echocardiogram performed.  

## 2013-08-16 ENCOUNTER — Other Ambulatory Visit: Payer: Self-pay

## 2013-08-28 ENCOUNTER — Ambulatory Visit (INDEPENDENT_AMBULATORY_CARE_PROVIDER_SITE_OTHER): Payer: BC Managed Care – PPO | Admitting: Cardiology

## 2013-08-28 ENCOUNTER — Encounter: Payer: Self-pay | Admitting: Cardiology

## 2013-08-28 VITALS — BP 130/68 | HR 68 | Ht 64.0 in | Wt 168.0 lb

## 2013-08-28 DIAGNOSIS — I10 Essential (primary) hypertension: Secondary | ICD-10-CM

## 2013-08-28 DIAGNOSIS — R079 Chest pain, unspecified: Secondary | ICD-10-CM

## 2013-08-28 DIAGNOSIS — K219 Gastro-esophageal reflux disease without esophagitis: Secondary | ICD-10-CM

## 2013-08-28 MED ORDER — OMEPRAZOLE 20 MG PO CPDR: 20.0000 mg | DELAYED_RELEASE_CAPSULE | Freq: Every day | ORAL | Status: DC

## 2013-08-28 NOTE — Patient Instructions (Signed)
Your physician has recommended you make the following change in your medication:   1. Stop Prevacid.  2. Start OTC Prilosec 20 mg daily.   Your physician recommends that you continue on your current medications as directed. Please refer to the Current Medication list given to you today.  Your physician wants you to follow-up in: 6 months with Dr. Radford Pax. You will receive a reminder letter in the mail two months in advance. If you don't receive a letter, please call our office to schedule the follow-up appointment.

## 2013-08-28 NOTE — Progress Notes (Signed)
[place], [place], [place]  [address] Phone: [phone] Fax:  [phone]  Date:  08/28/2013   ID:  Judith [name], DOB 07-18-1950, MRN [id number]  PCP:  No primary provider on file.  Cardiologist:  Fransico Him, MD     History of Present Illness: Judith [name] is a 63 y.o. female with a histor yof DM and HTN who presented recently for evaluation of chest painand jaw pain. She has a history of CP in the past with normal stress myoview. Apparently a few years ago she was having bad indigestion and was seen in the ER and was admitted and was seen by Cardiology and was given SL NTG to take PRN. She has had increasing bouts of indigestion that gets worse around 12 noon. She says it is burning but also a pain that she has a hard time describing. She denies any SOB, nausea or diaphoresis. The discomfort is midsternal and does not radiate. She takes the Prevacid and sometimes it helps and if it doesn't she takes the SL NTG and that relieves the pain. The discomfort occurs intermittenly throughout the week but not daily. She has been having DOE due to her smoking since age 47 and it has not worsened. She denies any dizziness or palpitations.  2D echo showed normal LVF with mildly thickened and stiff heart muscle and very mild AS as well as a trivial pericardial effusion.  She started taking Prevacid and her chest pain completely resolved and she cancelled her nuclear stress test.    Wt Readings from Last 3 Encounters:  07/31/13 169 lb (76.658 kg)  07/04/13 169 lb (76.658 kg)  06/01/13 164 lb 6.4 oz (74.571 kg)     Past Medical History  Diagnosis Date  . Diabetes mellitus without complication   . Hypertension   . Goiter   . Chronic kidney disease   . Allergy   . Arthritis   . Neuropathy     Current Outpatient Prescriptions  Medication Sig Dispense Refill  . albuterol (PROVENTIL HFA) 108 (90 BASE) MCG/ACT inhaler Inhale into the lungs every 6 (six) hours as needed for wheezing  or shortness of breath.      Marland Kitchen atenolol-chlorthalidone (TENORETIC) 50-25 MG per tablet TAKE ONE TABLET BY MOUTH ONCE DAILY  30 tablet  5  . buPROPion (WELLBUTRIN SR) 150 MG 12 hr tablet TAKE ONE TABLET BY MOUTH TWICE DAILY  60 tablet  5  . cyclobenzaprine (FLEXERIL) 10 MG tablet Take 1 tablet (10 mg total) by mouth daily.  30 tablet  2  . FLUoxetine (PROZAC) 20 MG capsule TAKE ONE CAPSULE BY MOUTH ONCE DAILY  30 capsule  5  . gabapentin (NEURONTIN) 300 MG capsule TAKE ONE CAPSULE BY MOUTH THREE TIMES DAILY  90 capsule  5  . glimepiride (AMARYL) 2 MG tablet Take 1/2 tablet twice a day before meals.  30 tablet  5  . lansoprazole (PREVACID 24HR) 15 MG capsule Take 1 capsule (15 mg total) by mouth daily at 12 noon.      . metFORMIN (GLUCOPHAGE) 1000 MG tablet TAKE ONE TABLET BY MOUTH TWICE DAILY WITH MEALS  60 tablet  5  . traMADol (ULTRAM) 50 MG tablet TAKE ONE TABLET BY MOUTH EVERY 8 HOURS AS NEEDED FOR PAIN  60 tablet  1  . triamcinolone cream (KENALOG) 0.1 % Apply 1 application topically 2 (two) times daily. May mix 1:1 with eurerin cream at home  454 g  0  . zoster vaccine  live, PF, (ZOSTAVAX) 95284 UNT/0.65ML injection Inject 19,400 Units into the skin once.  1 each  0   No current facility-administered medications for this visit.    Allergies:    Allergies  Allergen Reactions  . Iodine   . Neosporin [Neomycin-Bacitracin Zn-Polymyx]   . Penicillins   . Tape     Social History:  The patient  reports that she has been smoking.  She does not have any smokeless tobacco history on file. She reports that she does not drink alcohol or use illicit drugs.   Family History:  The patient's family history includes Allergies in her sister; Breast cancer in her maternal grandmother; COPD in her father; Cancer in her mother; Emphysema in her maternal grandfather and paternal grandfather; Heart attack in her father; Heart disease in her father; Hypertension in her father; Leukemia in her paternal  grandmother.   ROS:  Please see the history of present illness.      All other systems reviewed and negative.   PHYSICAL EXAM: VS:  There were no vitals taken for this visit. Well nourished, well developed, in no acute distress HEENT: normal Neck: no JVD Cardiac:  normal S1, S2; RRR; no murmur Lungs:  clear to auscultation bilaterally, no wheezing, rhonchi or rales Abd: soft, nontender, no hepatomegaly Ext: no edema Skin: warm and dry Neuro:  CNs 2-12 intact, no focal abnormalities noted  ASSESSMENT AND PLAN:  1. Chest pain with multiple cardiac risk factors including DM, HTN and ongoing tobacco use and EKG with loss of anterior forces in V1-V4.  2D echo showed normal LVF with mildly thickened and stiff heart muscle and trivial PE.Marland Kitchen  She cancelled her nuclear stress test because her symptoms resolved with Prevacid.   2. HTN - well controlled  - continue Tenoretic  3. DM  4. GERD - she would like to switch to Prilosec OTC 20mg  daily  5.  Very mild AS  Followup with me in 6 months    Signed, Fransico Him, MD 08/28/2013 10:39 AM

## 2013-09-03 ENCOUNTER — Ambulatory Visit (INDEPENDENT_AMBULATORY_CARE_PROVIDER_SITE_OTHER): Payer: BC Managed Care – PPO | Admitting: Family Medicine

## 2013-09-03 ENCOUNTER — Ambulatory Visit: Payer: BC Managed Care – PPO

## 2013-09-03 VITALS — BP 140/88 | HR 93 | Temp 98.6°F | Resp 18 | Ht 63.0 in | Wt 169.0 lb

## 2013-09-03 DIAGNOSIS — S298XXA Other specified injuries of thorax, initial encounter: Secondary | ICD-10-CM

## 2013-09-03 DIAGNOSIS — R079 Chest pain, unspecified: Secondary | ICD-10-CM

## 2013-09-03 DIAGNOSIS — R0781 Pleurodynia: Secondary | ICD-10-CM

## 2013-09-03 DIAGNOSIS — Z1239 Encounter for other screening for malignant neoplasm of breast: Secondary | ICD-10-CM

## 2013-09-03 NOTE — Progress Notes (Signed)
Discussed the patient with Tor Netters FNP . X-rays examined and no fractures noted. Treatment plan agreed with.

## 2013-09-03 NOTE — Progress Notes (Signed)
   Subjective:    Patient ID: Judith [name], female    DOB: 08/05/1950, 62 y.o.   MRN: [id number]  HPI Patient fell asleep on the toilet (at 3 am) at her daughter's house and fell onto the bathtub, hitting her chin and right breast. This occurred 5 days ago. Had two days where pain wasn't too bad, but has noticed it has gotten worse. She takes Ultram and Neurotin for diabetic neuropathy. She took flexeril at bedtime for the last 4 days. She applied heat and ice to the affected area without improvement in symptoms.  Under chin with bruise, no bleeding.  Sees Dr. Leward Quan for primary care and has an appointment with her 5/27. Does not check blood sugars at home, reports that her readings have been well controlled and she was able to come off her insulin. She has not had a mammogram or dexa scan. She was on prednisone many times in the past.  Review of Systems No cough, no fever, no SOB, no headache.    Objective:   Physical Exam  Vitals reviewed. Constitutional: She is oriented to person, place, and time. She appears well-developed and well-nourished. No distress.  HENT:  Head: Normocephalic.  Right Ear: External ear normal.  Left Ear: External ear normal.  1 cm bruise noted on underside of chin.  Eyes: Conjunctivae are normal. Right eye exhibits no discharge. Left eye exhibits no discharge.  Neck: Normal range of motion. Neck supple.  Cardiovascular: Normal rate, regular rhythm and normal heart sounds.  Exam reveals no gallop and no friction rub.   No murmur heard. Pulmonary/Chest: Effort normal and breath sounds normal. No respiratory distress. She has no wheezes. She has no rales. She exhibits tenderness (right anterior chest wall tender to palpation. No brusing, no swelling noted).  Musculoskeletal: Normal range of motion.  Lymphadenopathy:    She has no cervical adenopathy.  Neurological: She is alert and oriented to person, place, and time.  Skin: Skin is warm and dry. She is not  diaphoretic.  Psychiatric: She has a normal mood and affect. Her behavior is normal. Judgment and thought content normal.   DG ribs w/chest- [facility] reading (PRIMARY) by  Dr. Linna Darner- no fracture noted, low bone density.     Assessment & Plan:  1. Blunt injury to chest - DG Ribs Unilateral W/Chest Right; Future  2. Breast cancer screening -there is a standing order from 1/15. Will have referral department contact patient to make sure she schedules this with [facility].   3. Rib pain on right side - DG [facility]; Future - Encouraged patient to take deep breaths and gently stretch area while awake. -Can try low dose NSAIDs- ibuprofen 400 mg q12 for pain  Patient to follow up with Dr. Leward Quan as scheduled. In light of patient's elevated HgbA1C, from 1/15, encouraged her to eat more healthy. Also discussed need for smoking cessation.  Elby Beck, FNP-BC  [facility]  09/03/2013 12:38 PM

## 2013-09-03 NOTE — Patient Instructions (Signed)
Ibuprofen 400 mg every 12 hours for pain/inflammation Moist heat as needed Eat healthy for next month- let see how blood sugar does! Gentle stretching

## 2013-09-21 ENCOUNTER — Other Ambulatory Visit: Payer: Self-pay | Admitting: Family Medicine

## 2013-09-22 NOTE — Telephone Encounter (Signed)
Tramadol refill phoned to pt's pharmacy.

## 2013-09-28 ENCOUNTER — Ambulatory Visit (INDEPENDENT_AMBULATORY_CARE_PROVIDER_SITE_OTHER): Payer: BC Managed Care – PPO | Admitting: Family Medicine

## 2013-09-28 VITALS — BP 125/82 | HR 63 | Temp 99.1°F | Resp 16 | Ht 63.5 in | Wt 164.0 lb

## 2013-09-28 DIAGNOSIS — I10 Essential (primary) hypertension: Secondary | ICD-10-CM

## 2013-09-28 DIAGNOSIS — Z8619 Personal history of other infectious and parasitic diseases: Secondary | ICD-10-CM

## 2013-09-28 DIAGNOSIS — E119 Type 2 diabetes mellitus without complications: Secondary | ICD-10-CM

## 2013-09-28 MED ORDER — ZOSTER VACCINE LIVE 19400 UNT/0.65ML ~~LOC~~ SOLR: 0.6500 mL | Freq: Once | SUBCUTANEOUS | Status: DC

## 2013-09-28 MED ORDER — OMEPRAZOLE 20 MG PO CPDR: 20.0000 mg | DELAYED_RELEASE_CAPSULE | Freq: Every day | ORAL | Status: DC

## 2013-10-02 ENCOUNTER — Encounter: Payer: Self-pay | Admitting: Family Medicine

## 2013-10-02 NOTE — Progress Notes (Signed)
S;  This 63 y.o. Cauc female is here for DM and HTN follow-up. She feels good and is compliant w/ medications w/o adverse effects. She denies hypoglycemia. She in not checking FSBS daily. Her weight is stable and nutrition does not vary much. Last A1c= 8.8% in Jan 2015. She denies diaphoresis, fatigue, vision disturbances, CP or palpitations, SOB or DOE, edema, HA, dizziness, numbness, weakness or syncope.  She requests another RX for Zostavax; she lost the other one.  Patient Active Problem List   Diagnosis Date Noted  . Chest pain 07/31/2013  . Abnormal EKG 07/31/2013  . GERD (gastroesophageal reflux disease) 07/31/2013  . History of shingles 06/01/2013  . Depression with anxiety 06/01/2013  . Type II or unspecified type diabetes mellitus without mention of complication, uncontrolled 01/30/2013  . HTN (hypertension) 01/30/2013  . Diabetic neuropathy, painful 01/30/2013  . Tobacco user 01/30/2013   Prior to Admission medications   Medication Sig Start Date End Date Taking? Authorizing Provider  albuterol (PROVENTIL HFA) 108 (90 BASE) MCG/ACT inhaler Inhale into the lungs every 6 (six) hours as needed for wheezing or shortness of breath.   Yes Historical Provider, MD  atenolol-chlorthalidone (TENORETIC) 50-25 MG per tablet TAKE ONE TABLET BY MOUTH ONCE DAILY 06/01/13  Yes Barton Fanny, MD  buPROPion [facility] SR) 150 MG 12 hr tablet TAKE ONE TABLET BY MOUTH TWICE DAILY 06/01/13  Yes Barton Fanny, MD  cyclobenzaprine (FLEXERIL) 10 MG tablet Take 1 tablet (10 mg total) by mouth daily.   Yes Barton Fanny, MD  FLUoxetine (PROZAC) 20 MG capsule TAKE ONE CAPSULE BY MOUTH ONCE DAILY 06/01/13  Yes Barton Fanny, MD  gabapentin (NEURONTIN) 300 MG capsule TAKE ONE CAPSULE BY MOUTH THREE TIMES DAILY 06/01/13  Yes Barton Fanny, MD  glimepiride (AMARYL) 2 MG tablet Take 1/2 tablet twice a day before meals. 06/01/13  Yes Barton Fanny, MD  metFORMIN (GLUCOPHAGE) 1000  MG tablet TAKE ONE TABLET BY MOUTH TWICE DAILY WITH MEALS 06/01/13  Yes Barton Fanny, MD  omeprazole (PRILOSEC) 20 MG capsule Take 1 capsule (20 mg total) by mouth daily.   Yes Barton Fanny, MD  traMADol (ULTRAM) 50 MG tablet TAKE ONE TABLET BY MOUTH EVERY 8 HOURS AS NEEDED FOR PAIN   Yes Barton Fanny, MD  triamcinolone cream (KENALOG) 0.1 % Apply 1 application topically 2 (two) times daily. May mix 1:1 with eurerin cream at home 07/04/13  Yes Gay Filler Copland, MD  zoster vaccine live, PF, (ZOSTAVAX) 51761 UNT/0.65ML injection Inject 19,400 Units into the skin once.    Barton Fanny, MD   PMHx, Surg Hx, Soc and Fam HX reviewed.  ROS: As per HPI.  O: Filed Vitals:   09/28/13 1035  BP: 125/82  Pulse: 63  Temp: 99.1 F (37.3 C)  Resp: 16   GEN: In NAD; WN,WD. HENT: /AT; EOMI w/ clear conj/sclerae. Otherwise unremarkable. COR: RRR. LUNGS: Unlabored resp. SKIN: W&D; intact. See DM Foot exam. MS: MAEs; no c/c/e. No deformities or muscle atrophy. NEURO: A&O x 3; CNs intact.  Nonfocal.  A/P: Type II or unspecified type diabetes mellitus without mention of complication, not stated as uncontrolled - Stable on current medications. No medication changes. Plan: HM Diabetes Foot Exam  HTN (hypertension)- Stable on current medications; continue same.  History of shingles - Plan: zoster vaccine live, PF, (ZOSTAVAX) 60737 UNT/0.65ML injection  Meds ordered this encounter  Medications  . zoster vaccine live, PF, (ZOSTAVAX) 10626 UNT/0.65ML injection  Sig: Inject 19,400 Units into the skin once.    Dispense:  1 each    Refill:  0  . omeprazole (PRILOSEC) 20 MG capsule    Sig: Take 1 capsule (20 mg total) by mouth daily.    Dispense:  30 capsule    Refill:  5

## 2013-10-19 ENCOUNTER — Other Ambulatory Visit: Payer: Self-pay | Admitting: Family Medicine

## 2013-10-19 NOTE — Telephone Encounter (Signed)
Tramadol refill phoned to pt's pharmacy.

## 2013-10-20 ENCOUNTER — Encounter: Payer: Self-pay | Admitting: Gastroenterology

## 2013-10-22 NOTE — Telephone Encounter (Signed)
error 

## 2013-11-27 ENCOUNTER — Other Ambulatory Visit: Payer: Self-pay | Admitting: Family Medicine

## 2013-11-28 NOTE — Telephone Encounter (Signed)
Tramadol refill #60 w/ no refill phoned to pt's pharmacy.

## 2013-12-27 ENCOUNTER — Other Ambulatory Visit: Payer: Self-pay | Admitting: Family Medicine

## 2013-12-29 NOTE — Telephone Encounter (Signed)
Tramadol refill phoned to pt's pharmacy.

## 2014-01-01 ENCOUNTER — Other Ambulatory Visit: Payer: Self-pay | Admitting: Family Medicine

## 2014-02-04 ENCOUNTER — Other Ambulatory Visit: Payer: Self-pay | Admitting: Family Medicine

## 2014-02-05 ENCOUNTER — Other Ambulatory Visit: Payer: Self-pay | Admitting: Family Medicine

## 2014-02-05 MED ORDER — TRAMADOL HCL 50 MG PO TABS: ORAL_TABLET | ORAL | Status: DC

## 2014-02-05 NOTE — Telephone Encounter (Signed)
Tramadol and cyclobenzaprine refills routed to pt's pharmacy.

## 2014-03-06 ENCOUNTER — Other Ambulatory Visit: Payer: Self-pay | Admitting: Family Medicine

## 2014-03-07 NOTE — Telephone Encounter (Signed)
Tramadol refill phoned to pt's pharmacy.

## 2014-03-10 HISTORY — PX: CARPAL TUNNEL RELEASE: SHX101

## 2014-03-26 ENCOUNTER — Other Ambulatory Visit: Payer: Self-pay | Admitting: Family Medicine

## 2014-03-26 NOTE — Telephone Encounter (Signed)
Tramadol refill phoned to pt's pharmacy.

## 2014-04-01 ENCOUNTER — Other Ambulatory Visit: Payer: Self-pay | Admitting: Family Medicine

## 2014-04-02 ENCOUNTER — Ambulatory Visit (INDEPENDENT_AMBULATORY_CARE_PROVIDER_SITE_OTHER): Payer: BC Managed Care – PPO | Admitting: Family Medicine

## 2014-04-02 ENCOUNTER — Encounter: Payer: Self-pay | Admitting: Family Medicine

## 2014-04-02 VITALS — BP 129/80 | HR 74 | Temp 99.2°F | Resp 16 | Ht 64.5 in | Wt 169.0 lb

## 2014-04-02 DIAGNOSIS — Z760 Encounter for issue of repeat prescription: Secondary | ICD-10-CM

## 2014-04-02 DIAGNOSIS — Z716 Tobacco abuse counseling: Secondary | ICD-10-CM

## 2014-04-02 DIAGNOSIS — Z23 Encounter for immunization: Secondary | ICD-10-CM

## 2014-04-02 DIAGNOSIS — I10 Essential (primary) hypertension: Secondary | ICD-10-CM

## 2014-04-02 DIAGNOSIS — E1142 Type 2 diabetes mellitus with diabetic polyneuropathy: Secondary | ICD-10-CM

## 2014-04-02 DIAGNOSIS — Z720 Tobacco use: Secondary | ICD-10-CM

## 2014-04-02 LAB — COMPREHENSIVE METABOLIC PANEL
ALK PHOS: 90 U/L (ref 39–117)
ALT: 11 U/L (ref 0–35)
AST: 14 U/L (ref 0–37)
Albumin: 4 g/dL (ref 3.5–5.2)
BUN: 20 mg/dL (ref 6–23)
CALCIUM: 9.2 mg/dL (ref 8.4–10.5)
CO2: 29 mEq/L (ref 19–32)
Chloride: 95 mEq/L — ABNORMAL LOW (ref 96–112)
Creat: 0.86 mg/dL (ref 0.50–1.10)
GLUCOSE: 238 mg/dL — AB (ref 70–99)
Potassium: 4.1 mEq/L (ref 3.5–5.3)
SODIUM: 133 meq/L — AB (ref 135–145)
TOTAL PROTEIN: 6.8 g/dL (ref 6.0–8.3)
Total Bilirubin: 0.3 mg/dL (ref 0.2–1.2)

## 2014-04-02 LAB — CBC
HEMATOCRIT: 37.2 % (ref 36.0–46.0)
Hemoglobin: 12.3 g/dL (ref 12.0–15.0)
MCH: 28.1 pg (ref 26.0–34.0)
MCHC: 33.1 g/dL (ref 30.0–36.0)
MCV: 85.1 fL (ref 78.0–100.0)
MPV: 9.5 fL (ref 9.4–12.4)
PLATELETS: 312 10*3/uL (ref 150–400)
RBC: 4.37 MIL/uL (ref 3.87–5.11)
RDW: 13.5 % (ref 11.5–15.5)
WBC: 6.3 10*3/uL (ref 4.0–10.5)

## 2014-04-02 LAB — POCT GLYCOSYLATED HEMOGLOBIN (HGB A1C): Hemoglobin A1C: 11.3

## 2014-04-02 MED ORDER — METFORMIN HCL 1000 MG PO TABS: ORAL_TABLET | ORAL | Status: DC

## 2014-04-02 MED ORDER — FLUOXETINE HCL 20 MG PO CAPS: ORAL_CAPSULE | ORAL | Status: DC

## 2014-04-02 MED ORDER — GABAPENTIN 300 MG PO CAPS: ORAL_CAPSULE | ORAL | Status: DC

## 2014-04-02 MED ORDER — INSULIN PEN NEEDLE 32G X 6 MM MISC: Status: DC

## 2014-04-02 MED ORDER — INSULIN GLARGINE 100 UNIT/ML SOLOSTAR PEN: PEN_INJECTOR | SUBCUTANEOUS | Status: DC

## 2014-04-02 MED ORDER — OMEPRAZOLE 20 MG PO CPDR: 20.0000 mg | DELAYED_RELEASE_CAPSULE | Freq: Every day | ORAL | Status: DC

## 2014-04-02 MED ORDER — TRIAMCINOLONE ACETONIDE 0.1 % EX CREA: 1.0000 "application " | TOPICAL_CREAM | Freq: Two times a day (BID) | CUTANEOUS | Status: DC

## 2014-04-02 MED ORDER — BUPROPION HCL ER (SR) 150 MG PO TB12
ORAL_TABLET | ORAL | Status: DC
Start: 2014-04-02 — End: 2014-09-28

## 2014-04-02 NOTE — Patient Instructions (Addendum)
Increase your medication by four units weekly until your fasting blood sugar is 120.   Diabetes Mellitus and Food It is important for you to manage your blood sugar (glucose) level. Your blood glucose level can be greatly affected by what you eat. Eating healthier foods in the appropriate amounts throughout the day at about the same time each day will help you control your blood glucose level. It can also help slow or prevent worsening of your diabetes mellitus. Healthy eating may even help you improve the level of your blood pressure and reach or maintain a healthy weight.  HOW CAN FOOD AFFECT ME? Carbohydrates Carbohydrates affect your blood glucose level more than any other type of food. Your dietitian will help you determine how many carbohydrates to eat at each meal and teach you how to count carbohydrates. Counting carbohydrates is important to keep your blood glucose at a healthy level, especially if you are using insulin or taking certain medicines for diabetes mellitus. Alcohol Alcohol can cause sudden decreases in blood glucose (hypoglycemia), especially if you use insulin or take certain medicines for diabetes mellitus. Hypoglycemia can be a life-threatening condition. Symptoms of hypoglycemia (sleepiness, dizziness, and disorientation) are similar to symptoms of having too much alcohol.  If your health care provider has given you approval to drink alcohol, do so in moderation and use the following guidelines:  Women should not have more than one drink per day, and men should not have more than two drinks per day. One drink is equal to:  12 oz of beer.  5 oz of wine.  1 oz of hard liquor.  Do not drink on an empty stomach.  Keep yourself hydrated. Have water, diet soda, or unsweetened iced tea.  Regular soda, juice, and other mixers might contain a lot of carbohydrates and should be counted. WHAT FOODS ARE NOT RECOMMENDED? As you make food choices, it is important to remember  that all foods are not the same. Some foods have fewer nutrients per serving than other foods, even though they might have the same number of calories or carbohydrates. It is difficult to get your body what it needs when you eat foods with fewer nutrients. Examples of foods that you should avoid that are high in calories and carbohydrates but low in nutrients include:  Trans fats (most processed foods list trans fats on the Nutrition Facts label).  Regular soda.  Juice.  Candy.  Sweets, such as cake, pie, doughnuts, and cookies.  Fried foods. WHAT FOODS CAN I EAT? Have nutrient-rich foods, which will nourish your body and keep you healthy. The food you should eat also will depend on several factors, including:  The calories you need.  The medicines you take.  Your weight.  Your blood glucose level.  Your blood pressure level.  Your cholesterol level. You also should eat a variety of foods, including:  Protein, such as meat, poultry, fish, tofu, nuts, and seeds (lean animal proteins are best).  Fruits.  Vegetables.  Dairy products, such as milk, cheese, and yogurt (low fat is best).  Breads, grains, pasta, cereal, rice, and beans.  Fats such as olive oil, trans fat-free margarine, canola oil, avocado, and olives. DOES EVERYONE WITH DIABETES MELLITUS HAVE THE SAME MEAL PLAN? Because every person with diabetes mellitus is different, there is not one meal plan that works for everyone. It is very important that you meet with a dietitian who will help you create a meal plan that is just right for you.  Document Released: 01/21/2005 Document Revised: 05/01/2013 Document Reviewed: 03/23/2013 [facility] Patient Information 2015 [place], [place]. This information is not intended to replace advice given to you by your health care provider. Make sure you discuss any questions you have with your health care provider.

## 2014-04-02 NOTE — Progress Notes (Signed)
IDENTIFYING INFORMATION  Judith [name] / DOB: 1951/03/06 / MRN: [id number]  The patient has Diabetes; HTN (hypertension); Diabetic neuropathy, painful; Tobacco user; Depression with anxiety; and GERD (gastroesophageal reflux disease) on her problem list.  SUBJECTIVE  Chief Complaint: Follow-up; Hypertension; and Diabetes   History of present illness: Judith [name] is a 63 y.o. year old female who presents for follow up of her hypertension and diabetes.   She checks her blood sugar once a week in the afternoons and reports it averages 250. She reports taking 2000 mg of metformin daily along with 2 mg of glyburide. She is medication compliant.  She reports an episode of hyperglycemia two weeks ago. She was at her church putting out chairs when this happened.  She became nauseated and dizzy with this episode, she did not however check her sugar at this time.    She denies chest pain, palpitations, SOB, DOE, and changes in urination.  She does report a history of mild chest pain, however, this problem went away with the start of Prilosec 20 mg po daily.     Hypertension she is maintained on Tenoretic.  She reports this medication is working well.  She denies acute changes in vision, acute weakness, headache, and dizziness.    She is interested in a smoking cessation after the holidays. She would like to try quitting on her own, and wants to follow up in 8 weeks.  She is amenable to starting Chantix at that time should she be unable to quit on her own.      Vaccinations: She would like her annual flu vaccine today, and would also like to receive the Prevnar today.  She is amenable to receiving the pneumovax in 8 weeks.   Opthalmology: She has seen ophthalmologist in the last year.      She  has a past medical history of Diabetes mellitus without complication; Hypertension; Goiter; Chronic kidney disease; Allergy; Arthritis; Neuropathy; History of shingles (06/01/2013); and Abnormal EKG  (07/31/2013).    She has a current medication list which includes the following prescription(s): albuterol, atenolol-chlorthalidone, bupropion, cyclobenzaprine, fluoxetine, fluoxetine, gabapentin, glimepiride, metformin, omeprazole, tramadol, triamcinolone cream.  Judith [name] is allergic to codeine; iodine; neosporin; penicillins; and tape. She  reports that she has been smoking.  She does not have any smokeless tobacco history on file. She reports that she does not drink alcohol or use illicit drugs. She  reports that she does not engage in sexual activity.  The patient  has past surgical history that includes Tonsillectomy; Cesarean section; Abdominal hysterectomy; Carpal tunnel release; right knee; right shoulder; and Foot neuroma surgery.  Her family history includes Allergies in her sister; Breast cancer in her maternal grandmother; COPD in her father; Cancer in her mother; Emphysema in her maternal grandfather and paternal grandfather; Heart attack in her father; Heart disease in her father; Hypertension in her father; Leukemia in her paternal grandmother.  Review of Systems  Constitutional: Negative for fever, chills and weight loss.  HENT: Negative for congestion and sore throat.   Eyes: Negative.   Respiratory: Negative for cough and wheezing.   Cardiovascular: Negative for chest pain, leg swelling and PND.  Gastrointestinal: Negative for heartburn, diarrhea and constipation.  Genitourinary: Negative for dysuria, urgency and frequency.  Skin: Negative for itching and rash.  Neurological: Negative for dizziness and headaches.  Endo/Heme/Allergies: Positive for environmental allergies.  Psychiatric/Behavioral: Negative for depression.    OBJECTIVE  Filed Vitals:   04/02/14 1523  BP: 129/80  Pulse: 74  Temp: 99.2 F (37.3 C)  Resp: 16   The patient's body mass index is 28.57 kg/(m^2).   Physical Exam  Constitutional: She is oriented to person, place, and time. She appears  well-developed and well-nourished. No distress.  HENT:  Head: Normocephalic.  Neck: Normal range of motion. Neck supple.  Cardiovascular: Normal rate, regular rhythm, normal heart sounds and intact distal pulses.   Respiratory: Effort normal and breath sounds normal.  GI: Soft.  Musculoskeletal: Normal range of motion.  Neurological: She is alert and oriented to person, place, and time. She has normal reflexes.  Skin: Skin is warm and dry.     Psychiatric: She has a normal mood and affect. Her behavior is normal. Judgment and thought content normal.    Lab Results  Component Value Date   CREATININE 0.79 06/01/2013    Results for orders placed or performed in visit on 04/02/14 (from the past 24 hour(s))  POCT glycosylated hemoglobin (Hb A1C)     Status: Abnormal   Collection Time: 04/02/14  4:12 PM  Result Value Ref Range   Hemoglobin A1C 11.3     ASSESSMENT & PLAN  Judith [name] was seen today for follow-up, hypertension and diabetes.  Diagnoses and associated orders for this visit:  Type 2 diabetes mellitus with diabetic polyneuropathy - POCT glycosylated hemoglobin (Hb A1C) - Comprehensive metabolic panel - Insulin Glargine (LANTUS SOLOSTAR) 100 UNIT/ML Solostar Pen; Start with 10 units at bed time. Titrate up per instructions. - Insulin Pen Needle 32G X 6 MM MISC; Use as directed - metFORMIN (GLUCOPHAGE) 1000 MG tablet; TAKE ONE TABLET BY MOUTH TWICE DAILY WITH MEALS  Essential hypertension - CBC  Need for prophylactic vaccination and inoculation against influenza - Flu Vaccine QUAD 36+ mos IM  Need for prophylactic vaccination against Streptococcus pneumoniae (pneumococcus) - Pneumococcal conjugate vaccine 13-valent IM  Medication refill - buPROPion (WELLBUTRIN SR) 150 MG 12 hr tablet; TAKE ONE TABLET BY MOUTH TWICE DAILY - FLUoxetine (PROZAC) 20 MG capsule; TAKE ONE CAPSULE BY MOUTH ONCE DAILY - gabapentin (NEURONTIN) 300 MG capsule; TAKE ONE CAPSULE BY MOUTH THREE  TIMES DAILY AND A FOURTH AT BEDTIME - omeprazole (PRILOSEC) 20 MG capsule; Take 1 capsule (20 mg total) by mouth daily. - triamcinolone cream (KENALOG) 0.1 %; Apply 1 application topically 2 (two) times daily. May mix 1:1 with eurerin cream at home  Encounter for smoking cessation counseling -     Patient amenable to smoking cessation after holidays.  She will try a nicotine              replacement along with an oral fixation device.  Should this fail she plans               to request Chantix at f/u.  The patient was instructed to to call or comeback to clinic as needed, or should symptoms warrant.  Philis Fendt, MHS, PA-C [facility] 04/02/2014 4:49 PM

## 2014-04-03 ENCOUNTER — Ambulatory Visit: Payer: BC Managed Care – PPO | Admitting: Family Medicine

## 2014-04-03 ENCOUNTER — Other Ambulatory Visit: Payer: Self-pay

## 2014-04-03 DIAGNOSIS — Z760 Encounter for issue of repeat prescription: Secondary | ICD-10-CM

## 2014-04-03 MED ORDER — OMEPRAZOLE 20 MG PO CPDR: 20.0000 mg | DELAYED_RELEASE_CAPSULE | Freq: Every day | ORAL | Status: DC

## 2014-04-03 NOTE — Progress Notes (Signed)
This 63 y.o. 53 female is well known to me; she has Type II DM, currently uncontrolled on oral medications. She c/o nausea after morning dose of Metformin; she admits that she does not eat well w/ morning dose, only consuming coffee. She reports an episode of diaphoresis and dizziness as described in Philis Fendt, PA-C's note; this may have been a hypoglycemic episode. Pt is intelligent and has advanced degrees in economics and nutrition. She is embarrassed that she is not managing her diabetes better.  Remainder of HPI and examination as per M. Carlis Abbott, PA-C's documentation.  I have discussed assessment and plan with PA-C; I agree w/ documentation and have nothing to add.  Pt to return to see me in ~ 8 weeks; we will address smoking cessation, recheck A1c to see if it is trending downward and review self- management improvement.  Barton Fanny, MD [facility]

## 2014-04-12 ENCOUNTER — Ambulatory Visit (INDEPENDENT_AMBULATORY_CARE_PROVIDER_SITE_OTHER): Payer: BC Managed Care – PPO | Admitting: Family Medicine

## 2014-04-12 ENCOUNTER — Ambulatory Visit (INDEPENDENT_AMBULATORY_CARE_PROVIDER_SITE_OTHER): Payer: BC Managed Care – PPO

## 2014-04-12 VITALS — BP 168/88 | HR 74 | Temp 97.8°F | Resp 18

## 2014-04-12 DIAGNOSIS — R0781 Pleurodynia: Secondary | ICD-10-CM

## 2014-04-12 DIAGNOSIS — R0789 Other chest pain: Secondary | ICD-10-CM

## 2014-04-12 DIAGNOSIS — R1111 Vomiting without nausea: Secondary | ICD-10-CM

## 2014-04-12 NOTE — Progress Notes (Addendum)
Patient ID: Judith [name], female   DOB: 1950-06-22, 63 y.o.   MRN: [id number]  This chart was scribed for Robyn Haber, MD by Ladene Artist, ED Scribe. The patient was seen in room 6. Patient's care was started at 9:16 AM.  Patient ID: Judith [name] MRN: [id number], DOB: 08-31-1950, 63 y.o. Date of Encounter: 04/12/2014, 9:39 AM  Primary Physician: Ellsworth Lennox, MD  Chief Complaint  Patient presents with   Rib Injury    left  8 days ago   HPI: 63 y.o. year old female with history below presents with L sided rib injury sustained on 03/25/14. Pt reports that she slipped on 04/04/14 while getting out of the tub and hit her ribs. Pain is exacerbated with coughing and laughing. She reports 2 episodes of emesis since yesterday morning that she attributes to consuming bad food. Pt states that she took a bite of rice and had a few sips of water last night but states that she was unable to keep this down as well.   DM Pt reports that she saw Dr. Leward Quan on 04/02/14 and was started on Lantus.   Past Medical History  Diagnosis Date   Diabetes mellitus without complication    Hypertension    Goiter    Chronic kidney disease    Allergy    Arthritis    Neuropathy    History of shingles 06/01/2013   Abnormal EKG 07/31/2013     Home Meds: Prior to Admission medications   Medication Sig Start Date End Date Taking? Authorizing Provider  albuterol (PROVENTIL HFA) 108 (90 BASE) MCG/ACT inhaler Inhale into the lungs every 6 (six) hours as needed for wheezing or shortness of breath.    Historical Provider, MD  atenolol-chlorthalidone (TENORETIC) 50-25 MG per tablet TAKE ONE TABLET BY MOUTH ONCE DAILY 04/01/14   Fara Chute, PA-C  buPROPion (WELLBUTRIN SR) 150 MG 12 hr tablet TAKE ONE TABLET BY MOUTH TWICE DAILY 04/02/14   Kathlen Brunswick, PA-C  cyclobenzaprine (FLEXERIL) 10 MG tablet TAKE ONE TABLET BY MOUTH ONCE DAILY 02/05/14   Barton Fanny, MD  FLUoxetine (PROZAC) 20 MG  capsule TAKE ONE CAPSULE BY MOUTH ONCE DAILY 04/02/14   Kathlen Brunswick, PA-C  gabapentin (NEURONTIN) 300 MG capsule TAKE ONE CAPSULE BY MOUTH THREE TIMES DAILY AND A FOURTH AT BEDTIME 04/02/14   Kathlen Brunswick, PA-C  Insulin Glargine (LANTUS SOLOSTAR) 100 UNIT/ML Solostar Pen Start with 10 units at bed time. Titrate up per instructions. 04/02/14   Kathlen Brunswick, PA-C  Insulin Pen Needle 32G X 6 MM MISC Use as directed 04/02/14   Kathlen Brunswick, PA-C  metFORMIN (GLUCOPHAGE) 1000 MG tablet TAKE ONE TABLET BY MOUTH TWICE DAILY WITH MEALS 04/02/14   Kathlen Brunswick, PA-C  omeprazole (PRILOSEC) 20 MG capsule Take 1 capsule (20 mg total) by mouth daily. 04/03/14   Kathlen Brunswick, PA-C  traMADol (ULTRAM) 50 MG tablet TAKE ONE TABLET BY MOUTH EVERY 8 HOURS AS NEEDED FOR PAIN 03/26/14   Barton Fanny, MD  triamcinolone cream (KENALOG) 0.1 % Apply 1 application topically 2 (two) times daily. May mix 1:1 with eurerin cream at home 04/02/14   Kathlen Brunswick, PA-C    Allergies:  Allergies  Allergen Reactions   Codeine    Iodine    Neosporin [Neomycin-Bacitracin Zn-Polymyx]    Penicillins    Tape     History   Social History   Marital Status: Divorced    Spouse Name: N/A  Number of Children: N/A   Years of Education: N/A   Occupational History   custodian    Social History Main Topics   Smoking status: Current Every Day Smoker   Smokeless tobacco: Not on file   Alcohol Use: No   Drug Use: No   Sexual Activity: No   Other Topics Concern   Not on file   Social History Narrative   Lives with sister in [place].   Divorced twice.     Review of Systems: Constitutional: negative for chills, fever, night sweats, weight changes, or fatigue  HEENT: negative for trouble swallowing, vision changes, hearing loss, congestion, rhinorrhea, ST, epistaxis, or sinus pressure Cardiovascular: negative for chest pain or palpitations Respiratory:  negative for hemoptysis, wheezing, shortness of breath, +cough Abdominal: negative for abdominal pain, nausea, diarrhea, or constipation, +vomiting Msk: +myalgias Dermatological: negative for rash Neurologic: negative for headache, dizziness, or syncope All other systems reviewed and are otherwise negative with the exception to those above and in the HPI.  Physical Exam: Triage Vitals: Blood pressure 168/88, pulse 74, temperature 97.8 F (36.6 C), temperature source Oral, resp. rate 18, SpO2 99 %., There is no weight on file to calculate BMI. General: Well developed, well nourished, in no acute distress. Head: Normocephalic, atraumatic, eyes without discharge, sclera non-icteric, nares are without discharge. Bilateral auditory canals clear, TM's are without perforation, pearly grey and translucent with reflective cone of light bilaterally. Oral cavity moist, posterior pharynx without exudate, erythema, peritonsillar abscess, or post nasal drip.  Neck: Supple. No thyromegaly. Full ROM. No lymphadenopathy. Lungs: Clear bilaterally to auscultation without wheezes, rales, or rhonchi. Breathing is unlabored. Heart: RRR with S1 S2. No murmurs, rubs, or gallops appreciated. Abdomen: Soft, non-tender, non-distended with normoactive bowel sounds. No hepatomegaly. No rebound/guarding. No obvious abdominal masses. Msk:  Strength and tone normal for age. Exquisitely tender over L ribs. Extremities/Skin: Warm and dry. No clubbing or cyanosis. No edema. No rashes or suspicious lesions. Neuro: Alert and oriented X 3. Moves all extremities spontaneously. Gait is normal. CNII-XII grossly in tact. Psych:  Responds to questions appropriately with a normal affect.   Objective: Pt has a nondisplaced L 8th rib fracture.  Chest reveals diffuse rhonchi HEENT: Unremarkable There is no bruising on the left chest but she does exquisitely tender over the eighth rib in the anterior axillary line. No shortness of  breath Heart: Regular no murmur  ASSESSMENT AND PLAN:  63 y.o. year old female with eighth rib fracture on the left The primary encounter diagnosis was Rib pain on left side. Diagnoses of Other chest pain and Vomiting without nausea, vomiting of unspecified type were also pertinent to this visit. Rib fractures Norco prescribed along with cough med and z pak  I personally performed the services described in this documentation, which was scribed in my presence. The recorded information has been reviewed and is accurate. This chart was scribed in my presence and reviewed by me personally.  Signed, Robyn Haber, MD 04/12/2014 9:39 AM

## 2014-04-26 ENCOUNTER — Other Ambulatory Visit: Payer: Self-pay | Admitting: Family Medicine

## 2014-04-26 ENCOUNTER — Other Ambulatory Visit: Payer: Self-pay | Admitting: Physician Assistant

## 2014-04-26 NOTE — Telephone Encounter (Signed)
Tramadol refill phoned to pt's pharmacy. 

## 2014-05-22 ENCOUNTER — Other Ambulatory Visit: Payer: Self-pay | Admitting: Family Medicine

## 2014-05-24 NOTE — Telephone Encounter (Signed)
Tramadol refill phoned to pt's pharmacy. She has an appt later this month.

## 2014-06-04 ENCOUNTER — Ambulatory Visit: Payer: BC Managed Care – PPO | Admitting: Family Medicine

## 2014-06-16 ENCOUNTER — Other Ambulatory Visit: Payer: Self-pay | Admitting: Family Medicine

## 2014-06-19 NOTE — Telephone Encounter (Signed)
Tramadol refill phoned to pt's pharmacy. She is scheduled for OV 06/25/2014. No additional refills until she has OV w/ me.

## 2014-06-20 ENCOUNTER — Other Ambulatory Visit: Payer: Self-pay | Admitting: Physician Assistant

## 2014-06-25 ENCOUNTER — Ambulatory Visit (INDEPENDENT_AMBULATORY_CARE_PROVIDER_SITE_OTHER): Payer: No Typology Code available for payment source | Admitting: Family Medicine

## 2014-06-25 ENCOUNTER — Encounter: Payer: Self-pay | Admitting: Family Medicine

## 2014-06-25 VITALS — BP 140/82 | HR 81 | Temp 98.3°F | Resp 16 | Ht 64.25 in | Wt 172.0 lb

## 2014-06-25 DIAGNOSIS — E1142 Type 2 diabetes mellitus with diabetic polyneuropathy: Secondary | ICD-10-CM

## 2014-06-25 DIAGNOSIS — I10 Essential (primary) hypertension: Secondary | ICD-10-CM

## 2014-06-25 DIAGNOSIS — F418 Other specified anxiety disorders: Secondary | ICD-10-CM

## 2014-06-25 DIAGNOSIS — Z720 Tobacco use: Secondary | ICD-10-CM | POA: Diagnosis not present

## 2014-06-25 LAB — POCT GLYCOSYLATED HEMOGLOBIN (HGB A1C): HEMOGLOBIN A1C: 9.5

## 2014-06-25 MED ORDER — INSULIN GLARGINE 100 UNIT/ML SOLOSTAR PEN: PEN_INJECTOR | SUBCUTANEOUS | Status: DC

## 2014-06-25 MED ORDER — ATENOLOL-CHLORTHALIDONE 50-25 MG PO TABS: ORAL_TABLET | ORAL | Status: DC

## 2014-06-25 MED ORDER — TRAMADOL HCL 50 MG PO TABS: ORAL_TABLET | ORAL | Status: DC

## 2014-06-25 MED ORDER — CYCLOBENZAPRINE HCL 10 MG PO TABS: 10.0000 mg | ORAL_TABLET | Freq: Every day | ORAL | Status: DC

## 2014-06-25 NOTE — Progress Notes (Signed)
Subjective:    Patient ID: Judith [name], female    DOB: [birth year], 64 y.o.   MRN: [id number]  HPI  This 64 y.o. Female has uncontrolled Type II DM (Nov 2015 A1c= 11.3%), HTN and chronic tobacco dependence. Pt smokes ~ 1/2 ppd and expressed interest in Chantix at last visit. Concerns about medication side effects were discussed with her; she was encouraged to reduce usage and contact toll free [place]-QUIT line. She did not pursue this; she has had difficulty w/ BCBS insurance through [employer]. This is being resolved but she has managed to continue w/ all current medications.   Pt is s/p carpel tunnel release surgeries in Nov and Dec 2015. She has thick scarring at both wrists and has daily chronic pain. Gabapentin helps and she wants to increase dose. Tramadol taken 3 tabs daily eases pain. She is using a topical analgesic with some relief and requests refill on muscle relaxant. This helps her to relax at night so she can get adequate sleep. Dr. Caralyn Guile is the surgeon who performed procedures.   Pt is taking bupropion for depression and anxiety; this medication has enabled her to reduce tobacco use since starting it > 15 months ago. Medication side effect- dry mouth; she denies agitation, dizziness, tremor, palpitation, sweating, dysphoric mood, confusion, hallucinations, thoughts of self harm, SI/HI. She is less stressed since divorce.  HTN is well controlled on current medication; pt denies diaphoresis, vision disturbance (told by eye care specialist that she had a "early spot" of cataract), CP or tightness, SOB or cough, HA, numbness, weakness or syncope.  Patient Active Problem List   Diagnosis Date Noted  . GERD (gastroesophageal reflux disease) 07/31/2013  . Depression with anxiety 06/01/2013  . Diabetes 01/30/2013  . HTN (hypertension) 01/30/2013  . Diabetic neuropathy, painful 01/30/2013  . Tobacco user 01/30/2013    Prior to Admission medications   Medication Sig Start Date End  Date Taking? Authorizing Provider  albuterol (PROVENTIL HFA) 108 (90 BASE) MCG/ACT inhaler Inhale into the lungs every 6 (six) hours as needed for wheezing or shortness of breath.   Yes Historical Provider, MD  atenolol-chlorthalidone (TENORETIC) 50-25 MG per tablet TAKE ONE TABLET BY MOUTH ONCE DAILY.   Yes Barton Fanny, MD  buPROPion [facility] SR) 150 MG 12 hr tablet TAKE ONE TABLET BY MOUTH TWICE DAILY 04/02/14  Yes Kathlen Brunswick, PA-C  FLUoxetine (PROZAC) 20 MG capsule TAKE ONE CAPSULE BY MOUTH ONCE DAILY 04/02/14  Yes Kathlen Brunswick, PA-C  gabapentin (NEURONTIN) 300 MG capsule TAKE ONE CAPSULE BY MOUTH THREE TIMES DAILY AND A FOURTH AT BEDTIME 04/02/14  Yes Kathlen Brunswick, PA-C  Insulin Glargine (LANTUS SOLOSTAR) 100 UNIT/ML Solostar Pen Start with 10 units at bed time. Titrate up per instructions.   Yes Barton Fanny, MD  Insulin Pen Needle 32G X 6 MM MISC Use as directed 04/02/14  Yes Kathlen Brunswick, PA-C  metFORMIN (GLUCOPHAGE) 1000 MG tablet TAKE ONE TABLET BY MOUTH TWICE DAILY WITH MEALS 04/02/14  Yes Kathlen Brunswick, PA-C  omeprazole (PRILOSEC) 20 MG capsule Take 1 capsule (20 mg total) by mouth daily. 04/03/14  Yes Kathlen Brunswick, PA-C  traMADol (ULTRAM) 50 MG tablet Take 1 tablet in AM and 2 tablets at night for pain.   Yes Barton Fanny, MD  triamcinolone cream (KENALOG) 0.1 % Apply 1 application topically 2 (two) times daily. May mix 1:1 with eurerin cream at home 04/02/14  Yes Kathlen Brunswick, [place]  Past Surgical History  Procedure Laterality Date  . Tonsillectomy    . Cesarean section    . Abdominal hysterectomy    . Carpal tunnel release Right Nov 2015  . Right knee    . Right shoulder    . Foot neuroma surgery    . Carpel tunnel syndrome Left Dec 2015  . Carpel tunnel release Right 1992    [name]    Family History  Problem Relation Age of Onset  . Cancer Mother     bronchial cancer  . Hypertension Father   . COPD Father     . Heart disease Father   . Heart attack Father   . Allergies Sister   . Breast cancer Maternal Grandmother   . Emphysema Maternal Grandfather   . Leukemia Paternal Grandmother   . Emphysema Paternal Grandfather     Review of Systems As per HPI.     Objective:   Physical Exam  Constitutional: She is oriented to person, place, and time. She appears well-developed and well-nourished. No distress.  Blood pressure 140/82, pulse 81, temperature 98.3 F (36.8 C), temperature source Oral, resp. rate 16, height 5' 4.25" (1.632 m), weight 172 lb (78.019 kg), SpO2 98 %.   HENT:  Head: Normocephalic and atraumatic.  Right Ear: External ear normal.  Left Ear: External ear normal.  Nose: Nose normal.  Mouth/Throat: Oropharynx is clear and moist.  Eyes: Conjunctivae and EOM are normal. Pupils are equal, round, and reactive to light. No scleral icterus.  Neck: Normal range of motion. Neck supple.  Cardiovascular: Normal rate and regular rhythm.   Pulmonary/Chest: Effort normal. No respiratory distress.  Musculoskeletal:       Right wrist: She exhibits decreased range of motion and deformity.       Left wrist: She exhibits tenderness and deformity.  Well healed thick scars at site of carpel tunnel release surgery.  Neurological: She is alert and oriented to person, place, and time. No cranial nerve deficit. Coordination normal.  Skin: Skin is warm and dry. She is not diaphoretic. No pallor.  Psychiatric: She has a normal mood and affect. Her behavior is normal. Judgment and thought content normal.  Nursing note and vitals reviewed.   Results for orders placed or performed in visit on 06/25/14  POCT glycosylated hemoglobin (Hb A1C)  Result Value Ref Range   Hemoglobin A1C 9.5        Assessment & Plan:  Tobacco user- Pt given printed smoking cessation strategies and tips.  Type 2 diabetes mellitus with diabetic polyneuropathy - Improved A1c; increased bedtime insulin to 20 units. Goal  A1c= 7- 8 %. Plan: POCT glycosylated hemoglobin (Hb A1C), Insulin Glargine (LANTUS SOLOSTAR) 100 UNIT/ML Solostar Pen  Depression with anxiety- Stable on current medications; continue Fluoxetine 20 mg 1 cap daily and bupropion 150 mg SR 1 tablet bid.  Essential hypertension- Stable on current medications.  Meds ordered this encounter  Medications  . Insulin Glargine (LANTUS SOLOSTAR) 100 UNIT/ML Solostar Pen    Sig: Start with 20 units at bed time. Titrate up per instructions.    Dispense:  5 pen    Refill:  PRN  . atenolol-chlorthalidone (TENORETIC) 50-25 MG per tablet    Sig: TAKE ONE TABLET BY MOUTH ONCE DAILY.    Dispense:  30 tablet    Refill:  5  . cyclobenzaprine (FLEXERIL) 10 MG tablet    Sig: Take 1 tablet (10 mg total) by mouth at bedtime.    Dispense:  30  tablet    Refill:  1  . traMADol (ULTRAM) 50 MG tablet    Sig: Take 1 tablet in AM and 2 tablets at night for pain.    Dispense:  90 tablet    Refill:  1

## 2014-06-25 NOTE — Patient Instructions (Signed)
Smoking Cessation, Tips for Success  If you are ready to quit smoking, congratulations! You have chosen to help yourself be healthier. Cigarettes bring nicotine, tar, carbon monoxide, and other irritants into your body. Your lungs, heart, and blood vessels will be able to work better without these poisons. There are many different ways to quit smoking. Nicotine gum, nicotine patches, a nicotine inhaler, or nicotine nasal spray can help with physical craving. Hypnosis, support groups, and medicines help break the habit of smoking.  WHAT THINGS CAN I DO TO MAKE QUITTING EASIER?   Here are some tips to help you quit for good:  · Pick a date when you will quit smoking completely. Tell all of your friends and family about your plan to quit on that date.  · Do not try to slowly cut down on the number of cigarettes you are smoking. Pick a quit date and quit smoking completely starting on that day.  · Throw away all cigarettes.    · Clean and remove all ashtrays from your home, work, and car.  · On a card, write down your reasons for quitting. Carry the card with you and read it when you get the urge to smoke.  · Cleanse your body of nicotine. Drink enough water and fluids to keep your urine clear or pale yellow. Do this after quitting to flush the nicotine from your body.  · Learn to predict your moods. Do not let a bad situation be your excuse to have a cigarette. Some situations in your life might tempt you into wanting a cigarette.  · Never have "just one" cigarette. It leads to wanting another and another. Remind yourself of your decision to quit.  · Change habits associated with smoking. If you smoked while driving or when feeling stressed, try other activities to replace smoking. Stand up when drinking your coffee. Brush your teeth after eating. Sit in a different chair when you read the paper. Avoid alcohol while trying to quit, and try to drink fewer caffeinated beverages. Alcohol and caffeine may urge you to  smoke.  · Avoid foods and drinks that can trigger a desire to smoke, such as sugary or spicy foods and alcohol.  · Ask people who smoke not to smoke around you.  · Have something planned to do right after eating or having a cup of coffee. For example, plan to take a walk or exercise.  · Try a relaxation exercise to calm you down and decrease your stress. Remember, you may be tense and nervous for the first 2 weeks after you quit, but this will pass.  · Find new activities to keep your hands busy. Play with a pen, coin, or rubber band. Doodle or draw things on paper.  · Brush your teeth right after eating. This will help cut down on the craving for the taste of tobacco after meals. You can also try mouthwash.    · Use oral substitutes in place of cigarettes. Try using lemon drops, carrots, cinnamon sticks, or chewing gum. Keep them handy so they are available when you have the urge to smoke.  · When you have the urge to smoke, try deep breathing.  · Designate your home as a nonsmoking area.  · If you are a heavy smoker, ask your health care provider about a prescription for nicotine chewing gum. It can ease your withdrawal from nicotine.  · Reward yourself. Set aside the cigarette money you save and buy yourself something nice.  · Look for   support from others. Join a support group or smoking cessation program. Ask someone at home or at work to help you with your plan to quit smoking.  · Always ask yourself, "Do I need this cigarette or is this just a reflex?" Tell yourself, "Today, I choose not to smoke," or "I do not want to smoke." You are reminding yourself of your decision to quit.  · Do not replace cigarette smoking with electronic cigarettes (commonly called e-cigarettes). The safety of e-cigarettes is unknown, and some may contain harmful chemicals.  · If you relapse, do not give up! Plan ahead and think about what you will do the next time you get the urge to smoke.  HOW WILL I FEEL WHEN I QUIT SMOKING?  You  may have symptoms of withdrawal because your body is used to nicotine (the addictive substance in cigarettes). You may crave cigarettes, be irritable, feel very hungry, cough often, get headaches, or have difficulty concentrating. The withdrawal symptoms are only temporary. They are strongest when you first quit but will go away within 10-14 days. When withdrawal symptoms occur, stay in control. Think about your reasons for quitting. Remind yourself that these are signs that your body is healing and getting used to being without cigarettes. Remember that withdrawal symptoms are easier to treat than the major diseases that smoking can cause.   Even after the withdrawal is over, expect periodic urges to smoke. However, these cravings are generally short lived and will go away whether you smoke or not. Do not smoke!  WHAT RESOURCES ARE AVAILABLE TO HELP ME QUIT SMOKING?  Your health care provider can direct you to community resources or hospitals for support, which may include:  · Group support.  · Education.  · Hypnosis.  · Therapy.  Document Released: 01/23/2004 Document Revised: 09/10/2013 Document Reviewed: 10/12/2012  ExitCare® Patient Information ©2015 [employer]. This information is not intended to replace advice given to you by your health care provider. Make sure you discuss any questions you have with your health care provider.

## 2014-08-01 ENCOUNTER — Other Ambulatory Visit: Payer: Self-pay | Admitting: Family Medicine

## 2014-08-06 MED ORDER — TRAMADOL HCL 50 MG PO TABS: ORAL_TABLET | ORAL | Status: DC

## 2014-08-06 NOTE — Addendum Note (Signed)
Addended by: Ellsworth Lennox B on: 08/06/2014 07:13 PM   Modules accepted: Orders

## 2014-08-06 NOTE — Telephone Encounter (Signed)
Pharm called back after receiving denial of Rf with my note that pt was given a Rx on 06/25/14, and reported that the pt states she does not have the Rx and said that she had given it to the pharmacy, but the pharm does not have a record of pt bringing it in. The last tramadol they filled for her was on 06/19/14 for #30 (10 day supply). I asked if she had run a DEA report to see if pt has filled anywhere else since and she said yes, and that there is no report pt has filled 2/16 Rx anywhere else. Dr Leward Quan, is it OK for me to call in the Rx you wrote on 2/16 to pharmacy?

## 2014-08-06 NOTE — Telephone Encounter (Addendum)
I phoned Tramadol refill to pt's pharmacy (#90 w/ 1 RF). Spoke with pt and she had previous Tramadol  RX filled at a [facility]. I will cancel Tramadol phoned to [place] on [address].

## 2014-08-06 NOTE — Addendum Note (Signed)
Addended by: Ellsworth Lennox B on: 08/06/2014 07:23 PM   Modules accepted: Orders, Medications

## 2014-09-09 ENCOUNTER — Other Ambulatory Visit: Payer: Self-pay | Admitting: Family Medicine

## 2014-09-24 ENCOUNTER — Ambulatory Visit: Payer: Self-pay | Admitting: Family Medicine

## 2014-09-27 ENCOUNTER — Encounter: Payer: Self-pay | Admitting: Family Medicine

## 2014-09-27 ENCOUNTER — Ambulatory Visit (INDEPENDENT_AMBULATORY_CARE_PROVIDER_SITE_OTHER): Payer: No Typology Code available for payment source | Admitting: Family Medicine

## 2014-09-27 VITALS — BP 144/87 | HR 75 | Temp 97.8°F | Resp 16 | Ht 65.2 in | Wt 169.8 lb

## 2014-09-27 DIAGNOSIS — Z8639 Personal history of other endocrine, nutritional and metabolic disease: Secondary | ICD-10-CM

## 2014-09-27 DIAGNOSIS — Z760 Encounter for issue of repeat prescription: Secondary | ICD-10-CM

## 2014-09-27 DIAGNOSIS — I10 Essential (primary) hypertension: Secondary | ICD-10-CM

## 2014-09-27 DIAGNOSIS — E1142 Type 2 diabetes mellitus with diabetic polyneuropathy: Secondary | ICD-10-CM | POA: Diagnosis not present

## 2014-09-27 LAB — THYROID PANEL WITH TSH
FREE THYROXINE INDEX: 2.1 (ref 1.4–3.8)
T3 UPTAKE: 28 % (ref 22–35)
T4, Total: 7.6 ug/dL (ref 4.5–12.0)
TSH: 0.775 u[IU]/mL (ref 0.350–4.500)

## 2014-09-27 LAB — BASIC METABOLIC PANEL
BUN: 20 mg/dL (ref 6–23)
CALCIUM: 9.2 mg/dL (ref 8.4–10.5)
CO2: 29 meq/L (ref 19–32)
CREATININE: 0.72 mg/dL (ref 0.50–1.10)
Chloride: 96 mEq/L (ref 96–112)
Glucose, Bld: 145 mg/dL — ABNORMAL HIGH (ref 70–99)
POTASSIUM: 4 meq/L (ref 3.5–5.3)
SODIUM: 137 meq/L (ref 135–145)

## 2014-09-27 MED ORDER — INSULIN DETEMIR 100 UNIT/ML ~~LOC~~ SOLN: 20.0000 [IU] | Freq: Every day | SUBCUTANEOUS | Status: DC

## 2014-09-27 MED ORDER — TRAMADOL HCL 50 MG PO TABS: 50.0000 mg | ORAL_TABLET | Freq: Two times a day (BID) | ORAL | Status: DC | PRN

## 2014-09-27 NOTE — Patient Instructions (Addendum)
You will need to call back at the beginning of July to schedule your appointment w/ Philis Fendt, PA-C for Diabetes follow-up.  Take care and enjoy your new job!

## 2014-09-28 DIAGNOSIS — Z8639 Personal history of other endocrine, nutritional and metabolic disease: Secondary | ICD-10-CM | POA: Insufficient documentation

## 2014-09-28 LAB — HEMOGLOBIN A1C
HEMOGLOBIN A1C: 8.2 % — AB (ref <5.7)
Mean Plasma Glucose: 189 mg/dL — ABNORMAL HIGH (ref <117)

## 2014-09-28 MED ORDER — BUPROPION HCL ER (SR) 150 MG PO TB12: ORAL_TABLET | ORAL | Status: DC

## 2014-09-28 MED ORDER — FLUOXETINE HCL 20 MG PO CAPS: ORAL_CAPSULE | ORAL | Status: DC

## 2014-09-28 MED ORDER — GABAPENTIN 300 MG PO CAPS: ORAL_CAPSULE | ORAL | Status: DC

## 2014-09-28 MED ORDER — METFORMIN HCL 1000 MG PO TABS: ORAL_TABLET | ORAL | Status: DC

## 2014-09-28 MED ORDER — INSULIN PEN NEEDLE 32G X 6 MM MISC: Status: DC

## 2014-09-28 MED ORDER — OMEPRAZOLE 20 MG PO CPDR: 20.0000 mg | DELAYED_RELEASE_CAPSULE | Freq: Every day | ORAL | Status: DC

## 2014-09-28 MED ORDER — INSULIN DETEMIR 100 UNIT/ML FLEXPEN: 20.0000 [IU] | PEN_INJECTOR | Freq: Every day | SUBCUTANEOUS | Status: DC

## 2014-10-01 ENCOUNTER — Encounter: Payer: Self-pay | Admitting: Family Medicine

## 2014-10-01 NOTE — Progress Notes (Signed)
Subjective:    Patient ID: Judith [name], female    DOB: [birth year], 64 y.o.   MRN: [id number]  HPI  This 64 y.o female has Type II DM, last A1c = 9.5% in Feb 2016. Pt reports compliance w/ medications; some non-adherence w/ nutrition modifications. FSBS mid-100s to 200. Morning FSBS "low" (100-120 per pt).  No hypoglycemic episodes. Has gained weight >. Limited exercise. Has a new job and is more satisfied w/ employment change; hopes this will result in less stress and more stable situation >> better self-care. Insurer has changed formulary and no longer covers Lantus; will cover Levemir.   Pt has hx of goiter; s/p partial removal of thyroid gland. She was told by specialist years ago that thyroid gland and remaining tissue would need monitoring. She is concerned that there may be some growth of gland. (We discussed [place] but pt wants to wait until she has been at her new job for several months and has a schedule).  HTN is well managed; pt is compliant w/ medications w/o adverse effects. She continues to smoke but goal is cessation by end of year. No recent CXR but may need some imaging within next 6-12 months (CXR vs CT Chest).   Patient Active Problem List   Diagnosis Date Noted  . History of goiter 09/28/2014  . GERD (gastroesophageal reflux disease) 07/31/2013  . Depression with anxiety 06/01/2013  . Diabetes 01/30/2013  . HTN (hypertension) 01/30/2013  . Diabetic neuropathy, painful 01/30/2013  . Tobacco user 01/30/2013    Prior to Admission medications   Medication Sig Start Date End Date Taking? Authorizing Provider  albuterol (PROVENTIL HFA) 108 (90 BASE) MCG/ACT inhaler Inhale into the lungs every 6 (six) hours as needed for wheezing or shortness of breath.   Yes Historical Provider, MD  atenolol-chlorthalidone (TENORETIC) 50-25 MG per tablet TAKE ONE TABLET BY MOUTH ONCE DAILY. 06/25/14  Yes Barton Fanny, MD  buPROPion [facility] SR) 150 MG 12 hr tablet TAKE ONE TABLET BY  MOUTH TWICE DAILY 09/28/14  Yes Barton Fanny, MD  cyclobenzaprine (FLEXERIL) 10 MG tablet Take 1 tablet (10 mg total) by mouth at bedtime. 06/25/14  Yes Barton Fanny, MD  FLUoxetine (PROZAC) 20 MG capsule TAKE ONE CAPSULE BY MOUTH ONCE DAILY 09/28/14  Yes Barton Fanny, MD  gabapentin (NEURONTIN) 300 MG capsule TAKE ONE CAPSULE BY MOUTH THREE TIMES DAILY AND A FOURTH AT BEDTIME 09/28/14  Yes Barton Fanny, MD  Insulin Pen Needle 32G X 6 MM MISC Use as directed 09/28/14  Yes Barton Fanny, MD  metFORMIN (GLUCOPHAGE) 1000 MG tablet TAKE ONE TABLET BY MOUTH TWICE DAILY WITH MEALS 09/28/14  Yes Barton Fanny, MD  omeprazole (PRILOSEC) 20 MG capsule Take 1 capsule (20 mg total) by mouth daily. 09/28/14  Yes Barton Fanny, MD  traMADol (ULTRAM) 50 MG tablet Take 1 tablet (50 mg total) by mouth every 12 (twelve) hours as needed. 09/27/14  Yes Barton Fanny, MD  triamcinolone cream (KENALOG) 0.1 % Apply 1 application topically 2 (two) times daily. May mix 1:1 with eurerin cream at home 04/02/14  Yes [name], PA-C  Insulin Detemir (LEVEMIR) 100 UNIT/ML Pen Inject 20-25 Units into the skin daily at 10 pm. 09/28/14   Barton Fanny, MD    Past Surgical History  Procedure Laterality Date  . Tonsillectomy    . Cesarean section    . Abdominal hysterectomy    . Carpal tunnel release Right Nov  2015  . Right knee    . Right shoulder    . Foot neuroma surgery    . Carpel tunnel syndrome Left Dec 2015  . Carpel tunnel release Right 1992    [name]    History   Social History  . Marital Status: Divorced    Spouse Name: N/A  . Number of Children: N/A  . Years of Education: N/A   Occupational History  . Supervisor- custodial    Social History Main Topics  . Smoking status: Current Every Day Smoker  . Smokeless tobacco: Not on file  . Alcohol Use: No  . Drug Use: No  . Sexual Activity: No   Other Topics Concern  . Not on file   Social  History Narrative   Lives with sister in [place].   Divorced twice.    Family History  Problem Relation Age of Onset  . Cancer Mother     bronchial cancer  . Hypertension Father   . COPD Father   . Heart disease Father   . Heart attack Father   . Allergies Sister   . Breast cancer Maternal Grandmother   . Emphysema Maternal Grandfather   . Leukemia Paternal Grandmother   . Emphysema Paternal Grandfather     Review of Systems  Constitutional: Negative.   HENT: Positive for voice change. Negative for congestion, postnasal drip, sore throat and trouble swallowing.   Eyes: Negative for visual disturbance.  Respiratory: Positive for cough. Negative for chest tightness, shortness of breath and wheezing.   Cardiovascular: Negative.   Gastrointestinal: Negative.   Endocrine: Negative for polydipsia, polyphagia and polyuria.  Musculoskeletal: Negative.   Skin: Negative.   Neurological: Positive for numbness.       Diabetic neuropathy.  Psychiatric/Behavioral: Negative.        Objective:   Physical Exam  Constitutional: She is oriented to person, place, and time. She appears well-developed and well-nourished. No distress.  Blood pressure 144/87, pulse 75, temperature 97.8 F (36.6 C), temperature source Oral, resp. rate 16, height 5' 5.2" (1.656 m), weight 169 lb 12.8 oz (77.021 kg), SpO2 97 %.    HENT:  Head: Normocephalic and atraumatic.  Right Ear: External ear normal.  Left Ear: External ear normal.  Nose: Nose normal.  Mouth/Throat: Oropharynx is clear and moist.  Eyes: Conjunctivae and EOM are normal. No scleral icterus.  Neck: Normal range of motion. Neck supple.  Very slight fullness of thyroid gland; no discrete nodules or masses. Gland is not tender.  Cardiovascular: Normal rate, regular rhythm and normal heart sounds.   Pulmonary/Chest: Effort normal and breath sounds normal. No respiratory distress.  Musculoskeletal: Normal range of motion. She  exhibits no edema or tenderness.  Lymphadenopathy:    She has no cervical adenopathy.  Neurological: She is alert and oriented to person, place, and time. No cranial nerve deficit. Coordination normal.  Skin: Skin is warm and dry. No rash noted. She is not diaphoretic. No pallor.  Psychiatric: She has a normal mood and affect. Her behavior is normal. Judgment and thought content normal.  Vitals reviewed.     Assessment & Plan:  Type 2 diabetes mellitus with diabetic polyneuropathy - Change insulin to Levemir 20-25 units subq every evening. Monitor FSBS w/ goal fasting= 90-105. Continue metformin 1000 mg 1 tab bid w/ meals. Plan: HM Diabetes Foot Exam, Basic metabolic panel, Hemoglobin A1c, metFORMIN (GLUCOPHAGE) 1000 MG tablet, Insulin Pen Needle 32G X 6 MM MISC  History of goiter - Pt  agrees to have ultrasound on thyroid after discussion w/ new provider. Plan: Thyroid Panel With TSH  Essential hypertension- Stable on current medication; focus on nutrition improvement and weight loss/increased exercise.  Medication refill - Plan: buPROPion (WELLBUTRIN SR) 150 MG 12 hr tablet, FLUoxetine (PROZAC) 20 MG capsule, gabapentin (NEURONTIN) 300 MG capsule, omeprazole (PRILOSEC) 20 MG capsule  Meds ordered this encounter  Medications  . traMADol (ULTRAM) 50 MG tablet    Sig: Take 1 tablet (50 mg total) by mouth every 12 (twelve) hours as needed.    Dispense:  60 tablet    Refill:  1    May fill on or after October 10, 2014. Refill may be released 30 days later.  Marland Kitchen buPROPion (WELLBUTRIN SR) 150 MG 12 hr tablet    Sig: TAKE ONE TABLET BY MOUTH TWICE DAILY    Dispense:  60 tablet    Refill:  5  . FLUoxetine (PROZAC) 20 MG capsule    Sig: TAKE ONE CAPSULE BY MOUTH ONCE DAILY    Dispense:  30 capsule    Refill:  5  . gabapentin (NEURONTIN) 300 MG capsule    Sig: TAKE ONE CAPSULE BY MOUTH THREE TIMES DAILY AND A FOURTH AT BEDTIME    Dispense:  90 capsule    Refill:  5  . metFORMIN (GLUCOPHAGE)  1000 MG tablet    Sig: TAKE ONE TABLET BY MOUTH TWICE DAILY WITH MEALS    Dispense:  60 tablet    Refill:  5  . omeprazole (PRILOSEC) 20 MG capsule    Sig: Take 1 capsule (20 mg total) by mouth daily.    Dispense:  30 capsule    Refill:  5  . Insulin Detemir (LEVEMIR) 100 UNIT/ML Pen    Sig: Inject 20-25 Units into the skin daily at 10 pm.    Dispense:  15 mL    Refill:  11  . Insulin Pen Needle 32G X 6 MM MISC    Sig: Use as directed    Dispense:  50 each    Refill:  5    Please provide needles for Levemir Flexpen.

## 2014-10-20 ENCOUNTER — Other Ambulatory Visit: Payer: Self-pay | Admitting: Family Medicine

## 2014-10-21 ENCOUNTER — Encounter: Payer: Self-pay | Admitting: *Deleted

## 2014-10-21 NOTE — Telephone Encounter (Signed)
Refill provided. She is okay to use this if it helps her sleep. It is a muscle relaxant and is generally benign however if she continues to have sleep issues, she may need an office visit. Please let patient know. Thank you!

## 2014-11-02 ENCOUNTER — Telehealth: Payer: Self-pay | Admitting: Physician Assistant

## 2014-11-02 DIAGNOSIS — Z13820 Encounter for screening for osteoporosis: Secondary | ICD-10-CM

## 2014-11-02 DIAGNOSIS — Z1231 Encounter for screening mammogram for malignant neoplasm of breast: Secondary | ICD-10-CM

## 2014-11-02 NOTE — Telephone Encounter (Signed)
It appears this patient is overdue for mammogram and DEXA.

## 2014-11-04 NOTE — Telephone Encounter (Signed)
Spoke with pt, she is planning to get this done so we can order for her. Which code to use for the tests?

## 2014-11-06 NOTE — Telephone Encounter (Signed)
I've extended the expired order that Dr. Leward Quan placed. It's likely under screening for osteoporosis-should already be associated with a diagnosis.

## 2014-11-07 NOTE — Telephone Encounter (Signed)
Orders placed.

## 2014-11-22 ENCOUNTER — Telehealth: Payer: Self-pay

## 2014-11-22 DIAGNOSIS — Z780 Asymptomatic menopausal state: Secondary | ICD-10-CM

## 2014-11-22 NOTE — Telephone Encounter (Signed)
[facility] called. They need another dx code for this pt's DEXA scan. Screening will not cover it. Thanks

## 2014-11-23 NOTE — Telephone Encounter (Signed)
Other than osteopenia and osteoporosis, what are the covered diagnoses for a DEXA?  If she has one of those, I'll use it.  ?post-menopausal

## 2014-11-25 ENCOUNTER — Other Ambulatory Visit: Payer: Self-pay | Admitting: Family Medicine

## 2014-11-25 NOTE — Telephone Encounter (Signed)
Code changed and new order placed.

## 2014-11-26 NOTE — Telephone Encounter (Signed)
Printed at 104. Will bring to 102 after clinic.  Meds ordered this encounter  Medications  . traMADol (ULTRAM) 50 MG tablet    Sig: TAKE ONE TABLET BY MOUTH EVERY 12 HOURS AS NEEDED FOR PAIN    Dispense:  60 tablet    Refill:  0

## 2014-11-27 NOTE — Telephone Encounter (Signed)
Faxed

## 2014-11-29 ENCOUNTER — Ambulatory Visit
Admission: RE | Admit: 2014-11-29 | Discharge: 2014-11-29 | Disposition: A | Discharge status: Home / Self Care | Payer: Commercial Managed Care - HMO | Source: Ambulatory Visit | Attending: Physician Assistant | Admitting: Physician Assistant

## 2014-11-29 DIAGNOSIS — Z780 Asymptomatic menopausal state: Secondary | ICD-10-CM

## 2014-12-03 ENCOUNTER — Ambulatory Visit (INDEPENDENT_AMBULATORY_CARE_PROVIDER_SITE_OTHER): Payer: Commercial Managed Care - HMO | Admitting: Family Medicine

## 2014-12-03 VITALS — BP 120/72 | HR 91 | Temp 98.5°F | Resp 18 | Ht 64.0 in | Wt 168.0 lb

## 2014-12-03 DIAGNOSIS — H6091 Unspecified otitis externa, right ear: Secondary | ICD-10-CM

## 2014-12-03 MED ORDER — CIPROFLOXACIN-DEXAMETHASONE 0.3-0.1 % OT SUSP: 4.0000 [drp] | Freq: Two times a day (BID) | OTIC | Status: DC

## 2014-12-03 NOTE — Patient Instructions (Signed)
Otitis Externa °Otitis externa is a bacterial or fungal infection of the outer ear canal. This is the area from the eardrum to the outside of the ear. Otitis externa is sometimes called "swimmer's ear." °CAUSES  °Possible causes of infection include: °· Swimming in dirty water. °· Moisture remaining in the ear after swimming or bathing. °· Mild injury (trauma) to the ear. °· Objects stuck in the ear (foreign body). °· Cuts or scrapes (abrasions) on the outside of the ear. °SIGNS AND SYMPTOMS  °The first symptom of infection is often itching in the ear canal. Later signs and symptoms may include swelling and redness of the ear canal, ear pain, and yellowish-white fluid (pus) coming from the ear. The ear pain may be worse when pulling on the earlobe. °DIAGNOSIS  °Your health care provider will perform a physical exam. A sample of fluid may be taken from the ear and examined for bacteria or fungi. °TREATMENT  °Antibiotic ear drops are often given for 10 to 14 days. Treatment may also include pain medicine or corticosteroids to reduce itching and swelling. °HOME CARE INSTRUCTIONS  °· Apply antibiotic ear drops to the ear canal as prescribed by your health care provider. °· Take medicines only as directed by your health care provider. °· If you have diabetes, follow any additional treatment instructions from your health care provider. °· Keep all follow-up visits as directed by your health care provider. °PREVENTION  °· Keep your ear dry. Use the corner of a towel to absorb water out of the ear canal after swimming or bathing. °· Avoid scratching or putting objects inside your ear. This can damage the ear canal or remove the protective wax that lines the canal. This makes it easier for bacteria and fungi to grow. °· Avoid swimming in lakes, polluted water, or poorly chlorinated pools. °· You may use ear drops made of rubbing alcohol and vinegar after swimming. Combine equal parts of white vinegar and alcohol in a bottle.  Put 3 or 4 drops into each ear after swimming. °SEEK MEDICAL CARE IF:  °· You have a fever. °· Your ear is still red, swollen, painful, or draining pus after 3 days. °· Your redness, swelling, or pain gets worse. °· You have a severe headache. °· You have redness, swelling, pain, or tenderness in the area behind your ear. °MAKE SURE YOU:  °· Understand these instructions. °· Will watch your condition. °· Will get help right away if you are not doing well or get worse. °Document Released: 04/26/2005 Document Revised: 09/10/2013 Document Reviewed: 05/13/2011 °ExitCare® Patient Information ©2015 [employer]. This information is not intended to replace advice given to you by your health care provider. Make sure you discuss any questions you have with your health care provider. ° °

## 2014-12-03 NOTE — Progress Notes (Signed)
Chief Complaint:  Chief Complaint  Patient presents with  . Ear Pain    rt on going issue     HPI: Judith [name] is a 64 y.o. female who reports to [facility] today complaining of right ear pain, she has had it for 3 weeks, went to water park 2 weeks ago  and now ear is worse. She has ahd no fevers or chills, she has pain with tugging on it, no hearing loss. Diabetes is good, last a1c was 8.2. Has not tried anything for this.   Past Medical History  Diagnosis Date  . Diabetes mellitus without complication   . Hypertension   . Goiter   . Chronic kidney disease   . Allergy   . Arthritis   . Neuropathy   . History of shingles 06/01/2013  . Abnormal EKG 07/31/2013   Past Surgical History  Procedure Laterality Date  . Tonsillectomy    . Cesarean section    . Abdominal hysterectomy    . Carpal tunnel release Right Nov 2015  . Right knee    . Right shoulder    . Foot neuroma surgery    . Carpel tunnel syndrome Left Dec 2015  . Carpel tunnel release Right 1992    [name]   History   Social History  . Marital Status: Divorced    Spouse Name: N/A  . Number of Children: N/A  . Years of Education: N/A   Occupational History  . custodian    Social History Main Topics  . Smoking status: Current Every Day Smoker  . Smokeless tobacco: Not on file  . Alcohol Use: No  . Drug Use: No  . Sexual Activity: No   Other Topics Concern  . None   Social History Narrative   Lives with sister in [place].   Divorced twice.   Family History  Problem Relation Age of Onset  . Cancer Mother     bronchial cancer  . Hypertension Father   . COPD Father   . Heart disease Father   . Heart attack Father   . Allergies Sister   . Breast cancer Maternal Grandmother   . Emphysema Maternal Grandfather   . Leukemia Paternal Grandmother   . Emphysema Paternal Grandfather    Allergies  Allergen Reactions  . Codeine   . Iodine   . Neosporin [Neomycin-Bacitracin Zn-Polymyx]    . Penicillins   . Tape    Prior to Admission medications   Medication Sig Start Date End Date Taking? Authorizing Provider  albuterol (PROVENTIL HFA) 108 (90 BASE) MCG/ACT inhaler Inhale into the lungs every 6 (six) hours as needed for wheezing or shortness of breath.   Yes Historical Provider, MD  atenolol-chlorthalidone (TENORETIC) 50-25 MG per tablet TAKE ONE TABLET BY MOUTH ONCE DAILY. 06/25/14  Yes Barton Fanny, MD  buPROPion [facility] SR) 150 MG 12 hr tablet TAKE ONE TABLET BY MOUTH TWICE DAILY 09/28/14  Yes Barton Fanny, MD  cyclobenzaprine (FLEXERIL) 10 MG tablet TAKE ONE TABLET BY MOUTH AT BEDTIME 10/21/14  Yes [name], PA-C  FLUoxetine (PROZAC) 20 MG capsule TAKE ONE CAPSULE BY MOUTH ONCE DAILY 09/28/14  Yes Barton Fanny, MD  gabapentin (NEURONTIN) 300 MG capsule TAKE ONE CAPSULE BY MOUTH THREE TIMES DAILY AND A FOURTH AT BEDTIME 09/28/14  Yes Barton Fanny, MD  Insulin Detemir (LEVEMIR) 100 UNIT/ML Pen Inject 20-25 Units into the skin daily at 10 pm. 09/28/14  Yes Barton Fanny,  MD  Insulin Pen Needle 32G X 6 MM MISC Use as directed 09/28/14  Yes Barton Fanny, MD  metFORMIN (GLUCOPHAGE) 1000 MG tablet TAKE ONE TABLET BY MOUTH TWICE DAILY WITH MEALS 09/28/14  Yes Barton Fanny, MD  omeprazole (PRILOSEC) 20 MG capsule Take 1 capsule (20 mg total) by mouth daily. 09/28/14  Yes Barton Fanny, MD  traMADol (ULTRAM) 50 MG tablet TAKE ONE TABLET BY MOUTH EVERY 12 HOURS AS NEEDED FOR PAIN 11/26/14  Yes Chelle Jeffery, PA-C  triamcinolone cream (KENALOG) 0.1 % Apply 1 application topically 2 (two) times daily. May mix 1:1 with eurerin cream at home 04/02/14  Yes [name], PA-C     ROS: The patient denies fevers, chills, night sweats, unintentional weight loss, chest pain, palpitations, wheezing, dyspnea on exertion, nausea, vomiting, abdominal pain, dysuria, hematuria, melena, numbness, weakness, or tingling.   All other systems have  been reviewed and were otherwise negative with the exception of those mentioned in the HPI and as above.    PHYSICAL EXAM: Filed Vitals:   12/03/14 1548  BP: 120/72  Pulse: 91  Temp: 98.5 F (36.9 C)  Resp: 18   Body mass index is 28.82 kg/(m^2).   General: Alert, no acute distress HEENT:  Normocephalic, atraumatic, oropharynx patent. EOMI, PERRLA Right external canal ear redness, tenderness with tugging of pinna, left ear TIM normal.  Cardiovascular:  Regular rate and rhythm, no rubs murmurs or gallops.  No Carotid bruits, radial pulse intact. No pedal edema.  Respiratory: Clear to auscultation bilaterally.  No wheezes, rales, or rhonchi.  No cyanosis, no use of accessory musculature Abdominal: No organomegaly, abdomen is soft and non-tender, positive bowel sounds. No masses. Skin: No rashes. Neurologic: Facial musculature symmetric. Psychiatric: Patient acts appropriately throughout our interaction. Lymphatic: No cervical or submandibular lymphadenopathy Musculoskeletal: Gait intact. No edema, tenderness   LABS: Results for orders placed or performed in visit on 32/99/24  Basic metabolic panel  Result Value Ref Range   Sodium 137 135 - 145 mEq/L   Potassium 4.0 3.5 - 5.3 mEq/L   Chloride 96 96 - 112 mEq/L   CO2 29 19 - 32 mEq/L   Glucose, Bld 145 (H) 70 - 99 mg/dL   BUN 20 6 - 23 mg/dL   Creat 0.72 0.50 - 1.10 mg/dL   Calcium 9.2 8.4 - 10.5 mg/dL  Thyroid Panel With TSH  Result Value Ref Range   T4, Total 7.6 4.5 - 12.0 ug/dL   T3 Uptake 28 22 - 35 %   Free Thyroxine Index 2.1 1.4 - 3.8   TSH 0.775 0.350 - 4.500 uIU/mL  Hemoglobin A1c  Result Value Ref Range   Hgb A1c MFr Bld 8.2 (H) <5.7 %   Mean Plasma Glucose 189 (H) <117 mg/dL     EKG/XRAY:   Primary read interpreted by Dr. Marin Comment at [facility].   ASSESSMENT/PLAN: Encounter Diagnosis  Name Primary?  . External otitis, right Yes   Ciprodex If no inprovement then consider azithromycin Fu prn   Gross  sideeffects, risk and benefits, and alternatives of medications d/w patient. Patient is aware that all medications have potential sideeffects and we are unable to predict every sideeffect or drug-drug interaction that may occur.  Judith Guerrier DO  12/03/2014 5:30 PM

## 2014-12-04 ENCOUNTER — Encounter: Payer: Self-pay | Admitting: *Deleted

## 2014-12-04 NOTE — Progress Notes (Signed)
Patient stopped by office in response to my mammo reminder letter.  Stated she had not had a mammo in over 5 years (divorced - no insurance for a while).  Stated she had been trying to choose a PCP replacement for Dr. Suzanne Boron, but the one she had considered, was not pictured on the wall any longer.  A recommendation of Dr. Tamala Julian was made. Patient agreed.  She is going to call back & schedule an appointment to establish care with Dr. Tamala Julian.  I thanked her for taking time out of her day to personally come to speak with me.  She in turn, thanked me for making her feel like we cared about her enough to send her a reminder letter, even when she wasn't in the office.  Is also looking for a new eye doctor - states the one she had been going to was "too big and impersonal".

## 2014-12-06 ENCOUNTER — Ambulatory Visit (INDEPENDENT_AMBULATORY_CARE_PROVIDER_SITE_OTHER): Payer: Commercial Managed Care - HMO | Admitting: Family Medicine

## 2014-12-06 ENCOUNTER — Encounter: Payer: Self-pay | Admitting: Family Medicine

## 2014-12-06 VITALS — BP 129/73 | HR 74 | Temp 98.6°F | Resp 16 | Wt 169.0 lb

## 2014-12-06 DIAGNOSIS — K219 Gastro-esophageal reflux disease without esophagitis: Secondary | ICD-10-CM

## 2014-12-06 DIAGNOSIS — R300 Dysuria: Secondary | ICD-10-CM

## 2014-12-06 DIAGNOSIS — F418 Other specified anxiety disorders: Secondary | ICD-10-CM | POA: Diagnosis not present

## 2014-12-06 DIAGNOSIS — Z760 Encounter for issue of repeat prescription: Secondary | ICD-10-CM | POA: Diagnosis not present

## 2014-12-06 DIAGNOSIS — E1142 Type 2 diabetes mellitus with diabetic polyneuropathy: Secondary | ICD-10-CM

## 2014-12-06 DIAGNOSIS — I10 Essential (primary) hypertension: Secondary | ICD-10-CM

## 2014-12-06 DIAGNOSIS — Z720 Tobacco use: Secondary | ICD-10-CM

## 2014-12-06 LAB — COMPREHENSIVE METABOLIC PANEL
ALT: 11 U/L (ref 6–29)
AST: 14 U/L (ref 10–35)
Albumin: 4.3 g/dL (ref 3.6–5.1)
Alkaline Phosphatase: 102 U/L (ref 33–130)
BUN: 24 mg/dL (ref 7–25)
CO2: 25 mmol/L (ref 20–31)
CREATININE: 1 mg/dL — AB (ref 0.50–0.99)
Calcium: 9.3 mg/dL (ref 8.6–10.4)
Chloride: 95 mmol/L — ABNORMAL LOW (ref 98–110)
Glucose, Bld: 311 mg/dL — ABNORMAL HIGH (ref 65–99)
Potassium: 4.1 mmol/L (ref 3.5–5.3)
SODIUM: 134 mmol/L — AB (ref 135–146)
Total Bilirubin: 0.3 mg/dL (ref 0.2–1.2)
Total Protein: 7.4 g/dL (ref 6.1–8.1)

## 2014-12-06 LAB — CBC WITH DIFFERENTIAL/PLATELET
BASOS ABS: 0 10*3/uL (ref 0.0–0.1)
Basophils Relative: 0 % (ref 0–1)
EOS ABS: 0.5 10*3/uL (ref 0.0–0.7)
Eosinophils Relative: 8 % — ABNORMAL HIGH (ref 0–5)
HCT: 37.2 % (ref 36.0–46.0)
HEMOGLOBIN: 12.9 g/dL (ref 12.0–15.0)
LYMPHS ABS: 1.6 10*3/uL (ref 0.7–4.0)
Lymphocytes Relative: 28 % (ref 12–46)
MCH: 28.8 pg (ref 26.0–34.0)
MCHC: 34.7 g/dL (ref 30.0–36.0)
MCV: 83 fL (ref 78.0–100.0)
MONO ABS: 0.5 10*3/uL (ref 0.1–1.0)
MPV: 9.6 fL (ref 8.6–12.4)
Monocytes Relative: 8 % (ref 3–12)
NEUTROS ABS: 3.2 10*3/uL (ref 1.7–7.7)
NEUTROS PCT: 56 % (ref 43–77)
PLATELETS: 317 10*3/uL (ref 150–400)
RBC: 4.48 MIL/uL (ref 3.87–5.11)
RDW: 14.1 % (ref 11.5–15.5)
WBC: 5.8 10*3/uL (ref 4.0–10.5)

## 2014-12-06 LAB — POCT URINALYSIS DIPSTICK
Blood, UA: NEGATIVE
Glucose, UA: >=1000
LEUKOCYTES UA: NEGATIVE
Nitrite, UA: NEGATIVE
PROTEIN UA: 100
SPEC GRAV UA: 1.015
Urobilinogen, UA: 0.2
pH, UA: 6.5

## 2014-12-06 LAB — LIPID PANEL
Cholesterol: 236 mg/dL — ABNORMAL HIGH (ref 125–200)
HDL: 29 mg/dL — AB (ref >=46)
Total CHOL/HDL Ratio: 8.1 Ratio — ABNORMAL HIGH (ref <=5.0)
Triglycerides: 993 mg/dL — ABNORMAL HIGH (ref <150)

## 2014-12-06 LAB — POCT UA - MICROSCOPIC ONLY
Bacteria, U Microscopic: NEGATIVE
CASTS, UR, LPF, POC: NEGATIVE
Crystals, Ur, HPF, POC: NEGATIVE
Epithelial cells, urine per micros: NEGATIVE
MUCUS UA: NEGATIVE
Yeast, UA: NEGATIVE

## 2014-12-06 LAB — TSH: TSH: 0.483 u[IU]/mL (ref 0.350–4.500)

## 2014-12-06 MED ORDER — GABAPENTIN 300 MG PO CAPS: ORAL_CAPSULE | ORAL | Status: DC

## 2014-12-06 NOTE — Progress Notes (Signed)
Subjective:    Patient ID: Judith [name], female    DOB: August 10, 1950, 64 y.o.   MRN: [id number]  12/06/2014  Peripheral Neuropathy   HPI This 64 y.o. female presents for three month follow-up:  1. DMII:  Levemir 24 units at bedtime.  Metformin one bid.  Fasting 100-120; after work, can increase to 165.  Highest sugar 185.    2. HTN:  Taking Atenolol-Chlorathalone.    3.  Anxiety and depression:  Patient reports good compliance with medication, good tolerance to medication, and good symptom control.    Taking Prozac and Wellbutrin.   4. Peripheral neuropathy:  Struggling with neuropathy since 1996.  Well managed with previous Lyrica but suffered with swelling.  Cymbalta came out; worked well for two years.  Then slowly it started worsening.  Recently, the hands have started burning. Current medication is not enough.  Recommended more neurontin; added more neurontin.  Stopped Cymbalta ten years.  Involves all fingers and hands diffusely; sharp shooting pains. Neurontin 2 mid-day and 2 at bedtime. In moring, can block it out.   Tramadol 2 at bedtime and 1 in afternoon.     5.  Asthma:  Albuterol sparingly; diagnosed in 1980.     6. GERD: takign Prilosec daily; working well.   7.  Dermatitis abdomen: s/p biopsy; s/p dermatology; uses triamcinolone cream.       Review of Systems  Constitutional: Negative for fever, chills, diaphoresis and fatigue.  Eyes: Negative for visual disturbance.  Respiratory: Negative for cough and shortness of breath.   Cardiovascular: Negative for chest pain, palpitations and leg swelling.  Gastrointestinal: Negative for nausea, vomiting, abdominal pain, diarrhea and constipation.  Endocrine: Negative for cold intolerance, heat intolerance, polydipsia, polyphagia and polyuria.  Neurological: Negative for dizziness, tremors, seizures, syncope, facial asymmetry, speech difficulty, weakness, light-headedness, numbness and headaches.    Past Medical History   Diagnosis Date  . Diabetes mellitus without complication   . Hypertension   . Goiter   . Chronic kidney disease   . Allergy   . Arthritis   . Neuropathy   . History of shingles 06/01/2013  . Abnormal EKG 07/31/2013   Past Surgical History  Procedure Laterality Date  . Tonsillectomy    . Cesarean section    . Abdominal hysterectomy    . Carpal tunnel release Right Nov 2015  . Right knee    . Right shoulder    . Foot neuroma surgery    . Carpel tunnel syndrome Left Dec 2015  . Carpel tunnel release Right 1992    [name]   Allergies  Allergen Reactions  . Codeine   . Iodine   . Neosporin [Neomycin-Bacitracin Zn-Polymyx]   . Penicillins   . Tape    Current Outpatient Prescriptions  Medication Sig Dispense Refill  . albuterol (PROVENTIL HFA) 108 (90 BASE) MCG/ACT inhaler Inhale into the lungs every 6 (six) hours as needed for wheezing or shortness of breath.    Marland Kitchen atenolol-chlorthalidone (TENORETIC) 50-25 MG per tablet TAKE ONE TABLET BY MOUTH ONCE DAILY. 30 tablet 5  . buPROPion (WELLBUTRIN SR) 150 MG 12 hr tablet TAKE ONE TABLET BY MOUTH TWICE DAILY 60 tablet 5  . ciprofloxacin-dexamethasone (CIPRODEX) otic suspension Place 4 drops into the right ear 2 (two) times daily. 7.5 mL 0  . cyclobenzaprine (FLEXERIL) 10 MG tablet TAKE ONE TABLET BY MOUTH AT BEDTIME 30 tablet 1  . FLUoxetine (PROZAC) 20 MG capsule TAKE ONE CAPSULE BY MOUTH ONCE DAILY 30 capsule 5  .  gabapentin (NEURONTIN) 300 MG capsule Three capsules at lunchtime; take three capsules at bedtime 180 capsule 5  . Insulin Detemir (LEVEMIR) 100 UNIT/ML Pen Inject 20-25 Units into the skin daily at 10 pm. 15 mL 11  . Insulin Pen Needle 32G X 6 MM MISC Use as directed 50 each 5  . metFORMIN (GLUCOPHAGE) 1000 MG tablet TAKE ONE TABLET BY MOUTH TWICE DAILY WITH MEALS 60 tablet 5  . omeprazole (PRILOSEC) 20 MG capsule Take 1 capsule (20 mg total) by mouth daily. 30 capsule 5  . traMADol (ULTRAM) 50 MG tablet TAKE ONE TABLET  BY MOUTH EVERY 12 HOURS AS NEEDED FOR PAIN (Patient taking differently: takes one tablet in the am and two tablets in the pm) 60 tablet 0  . triamcinolone cream (KENALOG) 0.1 % Apply 1 application topically 2 (two) times daily. May mix 1:1 with eurerin cream at home 454 g 0   No current facility-administered medications for this visit.   History   Social History  . Marital Status: Divorced    Spouse Name: N/A  . Number of Children: N/A  . Years of Education: N/A   Occupational History  . custodian    Social History Main Topics  . Smoking status: Current Every Day Smoker  . Smokeless tobacco: Not on file  . Alcohol Use: No  . Drug Use: No  . Sexual Activity: No   Other Topics Concern  . Not on file   Social History Narrative   Lives with sister in [place].   Divorced twice.       Objective:    BP 129/73 mmHg  Pulse 74  Temp(Src) 98.6 F (37 C) (Oral)  Resp 16  Wt 169 lb (76.658 kg)  SpO2 97% Physical Exam  Constitutional: She is oriented to person, place, and time. She appears well-developed and well-nourished. No distress.  HENT:  Head: Normocephalic and atraumatic.  Right Ear: External ear normal.  Left Ear: External ear normal.  Nose: Nose normal.  Mouth/Throat: Oropharynx is clear and moist.  Eyes: Conjunctivae and EOM are normal. Pupils are equal, round, and reactive to light.  Neck: Normal range of motion. Neck supple. Carotid bruit is not present. No thyromegaly present.  Cardiovascular: Normal rate, regular rhythm, normal heart sounds and intact distal pulses.  Exam reveals no gallop and no friction rub.   No murmur heard. Pulmonary/Chest: Effort normal and breath sounds normal. She has no wheezes. She has no rales.  Abdominal: Soft. Bowel sounds are normal. She exhibits no distension and no mass. There is no tenderness. There is no rebound and no guarding.  Lymphadenopathy:    She has no cervical adenopathy.  Neurological: She is alert and  oriented to person, place, and time. No cranial nerve deficit.  Skin: Skin is warm and dry. No rash noted. She is not diaphoretic. No erythema. No pallor.  Psychiatric: She has a normal mood and affect. Her behavior is normal.   Results for orders placed or performed in visit on 12/06/14  POCT urinalysis dipstick  Result Value Ref Range   Color, UA Light Yellow    Clarity, UA Clear    Glucose, UA >=1000    Bilirubin, UA Small    Ketones, UA Trace    Spec Grav, UA 1.015    Blood, UA Negative    pH, UA 6.5    Protein, UA 100    Urobilinogen, UA 0.2    Nitrite, UA Negative    Leukocytes, UA Negative  Negative  POCT UA - Microscopic Only  Result Value Ref Range   WBC, Ur, HPF, POC Newgative    RBC, urine, microscopic 0-1    Bacteria, U Microscopic Negative    Mucus, UA Negative    Epithelial cells, urine per micros Negative    Crystals, Ur, HPF, POC Negative    Casts, Ur, LPF, POC Negative    Yeast, UA Negative        Assessment & Plan:   1. Depression with anxiety   2. Tobacco user   3. Essential hypertension   4. Gastroesophageal reflux disease without esophagitis   5. Type 2 diabetes mellitus with diabetic polyneuropathy   6. Medication refill   7. Dysuria     Meds ordered this encounter  Medications  . gabapentin (NEURONTIN) 300 MG capsule    Sig: Three capsules at lunchtime; take three capsules at bedtime    Dispense:  180 capsule    Refill:  5    No Follow-up on file.    Judith [name], M.D. [facility] [address] [place], [place]  [address] [phone] phone [phone] fax

## 2014-12-07 LAB — MICROALBUMIN, URINE: Microalb, Ur: 22.7 mg/dL — ABNORMAL HIGH (ref <2.0)

## 2014-12-07 LAB — URINE CULTURE: Colony Count: 30000

## 2014-12-10 ENCOUNTER — Encounter: Payer: Self-pay | Admitting: Family Medicine

## 2014-12-27 ENCOUNTER — Other Ambulatory Visit: Payer: Self-pay | Admitting: Physician Assistant

## 2014-12-27 NOTE — Telephone Encounter (Signed)
Patient has CPE scheduled with Dr. Tamala Julian on 03/28/2015

## 2014-12-30 NOTE — Telephone Encounter (Signed)
Dr Tamala Julian, pt called back and LM on my VM stating that you had said you would straighten out her Tramadol Rx and send it in for her, and she has checked a couple of times and pharm doesn't have the Rx. She has "been without her pain med for 2 days". They sent req for sig 1 tab Q 12 hrs, but your OV notes from 12/06/14 stated that pt takes 2 tabs at bedtime and 1 tab in the afternoon. Pt has appt with you scheduled for 03/28/15. I pended the Rx as reported in your OV notes w/ QS quantity and 2 RFs to cover her until her appt. Please review and adjust as needed.

## 2014-12-31 NOTE — Telephone Encounter (Signed)
Called pt to notify of RFs and she was aware and thanked [place]! Also gave her # to [place] for MM before CPE, and notified Scheduling that she wants to get a PAP at CPE.

## 2015-01-06 ENCOUNTER — Ambulatory Visit
Admission: RE | Admit: 2015-01-06 | Discharge: 2015-01-06 | Disposition: A | Discharge status: Home / Self Care | Payer: Commercial Managed Care - HMO | Source: Ambulatory Visit | Attending: Physician Assistant | Admitting: Physician Assistant

## 2015-01-06 DIAGNOSIS — Z1231 Encounter for screening mammogram for malignant neoplasm of breast: Secondary | ICD-10-CM

## 2015-02-12 ENCOUNTER — Other Ambulatory Visit: Payer: Self-pay | Admitting: Student

## 2015-02-12 ENCOUNTER — Observation Stay (HOSPITAL_COMMUNITY)
Admission: EM | Admit: 2015-02-12 | Discharge: 2015-02-13 | Disposition: A | Discharge status: Home / Self Care | Payer: Commercial Managed Care - HMO | Attending: Internal Medicine | Admitting: Internal Medicine

## 2015-02-12 ENCOUNTER — Encounter (HOSPITAL_COMMUNITY): Payer: Self-pay | Admitting: *Deleted

## 2015-02-12 ENCOUNTER — Emergency Department (HOSPITAL_COMMUNITY): Payer: Commercial Managed Care - HMO

## 2015-02-12 DIAGNOSIS — R0602 Shortness of breath: Secondary | ICD-10-CM | POA: Diagnosis not present

## 2015-02-12 DIAGNOSIS — R079 Chest pain, unspecified: Secondary | ICD-10-CM

## 2015-02-12 DIAGNOSIS — N189 Chronic kidney disease, unspecified: Secondary | ICD-10-CM | POA: Insufficient documentation

## 2015-02-12 DIAGNOSIS — F418 Other specified anxiety disorders: Secondary | ICD-10-CM | POA: Diagnosis not present

## 2015-02-12 DIAGNOSIS — R42 Dizziness and giddiness: Secondary | ICD-10-CM | POA: Insufficient documentation

## 2015-02-12 DIAGNOSIS — E1151 Type 2 diabetes mellitus with diabetic peripheral angiopathy without gangrene: Secondary | ICD-10-CM

## 2015-02-12 DIAGNOSIS — Z8619 Personal history of other infectious and parasitic diseases: Secondary | ICD-10-CM | POA: Diagnosis not present

## 2015-02-12 DIAGNOSIS — Z23 Encounter for immunization: Secondary | ICD-10-CM | POA: Insufficient documentation

## 2015-02-12 DIAGNOSIS — Z79899 Other long term (current) drug therapy: Secondary | ICD-10-CM | POA: Insufficient documentation

## 2015-02-12 DIAGNOSIS — Z794 Long term (current) use of insulin: Secondary | ICD-10-CM | POA: Diagnosis not present

## 2015-02-12 DIAGNOSIS — Z720 Tobacco use: Secondary | ICD-10-CM | POA: Diagnosis not present

## 2015-02-12 DIAGNOSIS — R0789 Other chest pain: Principal | ICD-10-CM

## 2015-02-12 DIAGNOSIS — K219 Gastro-esophageal reflux disease without esophagitis: Secondary | ICD-10-CM | POA: Diagnosis not present

## 2015-02-12 DIAGNOSIS — G629 Polyneuropathy, unspecified: Secondary | ICD-10-CM | POA: Diagnosis not present

## 2015-02-12 DIAGNOSIS — E119 Type 2 diabetes mellitus without complications: Secondary | ICD-10-CM | POA: Diagnosis not present

## 2015-02-12 DIAGNOSIS — Z880 Allergy status to penicillin: Secondary | ICD-10-CM | POA: Diagnosis not present

## 2015-02-12 DIAGNOSIS — R110 Nausea: Secondary | ICD-10-CM | POA: Insufficient documentation

## 2015-02-12 DIAGNOSIS — I129 Hypertensive chronic kidney disease with stage 1 through stage 4 chronic kidney disease, or unspecified chronic kidney disease: Secondary | ICD-10-CM | POA: Diagnosis not present

## 2015-02-12 DIAGNOSIS — N179 Acute kidney failure, unspecified: Secondary | ICD-10-CM

## 2015-02-12 DIAGNOSIS — I10 Essential (primary) hypertension: Secondary | ICD-10-CM | POA: Diagnosis present

## 2015-02-12 DIAGNOSIS — E049 Nontoxic goiter, unspecified: Secondary | ICD-10-CM | POA: Insufficient documentation

## 2015-02-12 DIAGNOSIS — E871 Hypo-osmolality and hyponatremia: Secondary | ICD-10-CM

## 2015-02-12 HISTORY — DX: Gastro-esophageal reflux disease without esophagitis: K21.9

## 2015-02-12 HISTORY — DX: Carpal tunnel syndrome, bilateral upper limbs: G56.03

## 2015-02-12 LAB — URINALYSIS, ROUTINE W REFLEX MICROSCOPIC
BILIRUBIN URINE: NEGATIVE
Glucose, UA: 500 mg/dL — AB
Hgb urine dipstick: NEGATIVE
Ketones, ur: NEGATIVE mg/dL
NITRITE: NEGATIVE
PH: 7 (ref 5.0–8.0)
Protein, ur: 30 mg/dL — AB
Specific Gravity, Urine: 1.024 (ref 1.005–1.030)
UROBILINOGEN UA: 1 mg/dL (ref 0.0–1.0)

## 2015-02-12 LAB — CREATININE, SERUM
CREATININE: 1.33 mg/dL — AB (ref 0.44–1.00)
GFR calc Af Amer: 49 mL/min — ABNORMAL LOW (ref >60)
GFR calc non Af Amer: 42 mL/min — ABNORMAL LOW (ref >60)

## 2015-02-12 LAB — CBC
HCT: 36.1 % (ref 36.0–46.0)
HCT: 38.2 % (ref 36.0–46.0)
HEMOGLOBIN: 12.1 g/dL (ref 12.0–15.0)
HEMOGLOBIN: 12.8 g/dL (ref 12.0–15.0)
MCH: 28.8 pg (ref 26.0–34.0)
MCH: 29 pg (ref 26.0–34.0)
MCHC: 33.5 g/dL (ref 30.0–36.0)
MCHC: 33.5 g/dL (ref 30.0–36.0)
MCV: 86 fL (ref 78.0–100.0)
MCV: 86.4 fL (ref 78.0–100.0)
Platelets: 325 10*3/uL (ref 150–400)
Platelets: 323 10*3/uL (ref 150–400)
RBC: 4.2 MIL/uL (ref 3.87–5.11)
RBC: 4.42 MIL/uL (ref 3.87–5.11)
RDW: 13.4 % (ref 11.5–15.5)
RDW: 13.3 % (ref 11.5–15.5)
WBC: 9.6 10*3/uL (ref 4.0–10.5)
WBC: 8.2 10*3/uL (ref 4.0–10.5)

## 2015-02-12 LAB — BASIC METABOLIC PANEL
Anion gap: 17 — ABNORMAL HIGH (ref 5–15)
BUN: 26 mg/dL — AB (ref 6–20)
CHLORIDE: 85 mmol/L — AB (ref 101–111)
CO2: 27 mmol/L (ref 22–32)
Calcium: 8.8 mg/dL — ABNORMAL LOW (ref 8.9–10.3)
Creatinine, Ser: 1.82 mg/dL — ABNORMAL HIGH (ref 0.44–1.00)
GFR calc Af Amer: 33 mL/min — ABNORMAL LOW (ref >60)
GFR, EST NON AFRICAN AMERICAN: 29 mL/min — AB (ref >60)
GLUCOSE: 260 mg/dL — AB (ref 65–99)
Potassium: 3.7 mmol/L (ref 3.5–5.1)
Sodium: 129 mmol/L — ABNORMAL LOW (ref 135–145)

## 2015-02-12 LAB — HEPATIC FUNCTION PANEL
ALBUMIN: 3.8 g/dL (ref 3.5–5.0)
ALK PHOS: 80 U/L (ref 38–126)
ALT: 14 U/L (ref 14–54)
AST: 17 U/L (ref 15–41)
Bilirubin, Direct: <0.1 mg/dL — ABNORMAL LOW (ref 0.1–0.5)
Total Bilirubin: 0.3 mg/dL (ref 0.3–1.2)
Total Protein: 7.1 g/dL (ref 6.5–8.1)

## 2015-02-12 LAB — CBG MONITORING, ED
Glucose-Capillary: 266 mg/dL — ABNORMAL HIGH (ref 65–99)
Glucose-Capillary: 110 mg/dL — ABNORMAL HIGH (ref 65–99)

## 2015-02-12 LAB — D-DIMER, QUANTITATIVE: D-Dimer, Quant: 0.43 ug/mL-FEU (ref 0.00–0.48)

## 2015-02-12 LAB — I-STAT TROPONIN, ED: TROPONIN I, POC: 0.01 ng/mL (ref 0.00–0.08)

## 2015-02-12 LAB — GLUCOSE, CAPILLARY: GLUCOSE-CAPILLARY: 273 mg/dL — AB (ref 65–99)

## 2015-02-12 LAB — TSH: TSH: 0.704 u[IU]/mL (ref 0.350–4.500)

## 2015-02-12 LAB — URINE MICROSCOPIC-ADD ON

## 2015-02-12 LAB — TROPONIN I: Troponin I: <0.03 ng/mL (ref <0.031)

## 2015-02-12 MED ORDER — INSULIN ASPART 100 UNIT/ML ~~LOC~~ SOLN
0.0000 [IU] | Freq: Three times a day (TID) | SUBCUTANEOUS | Status: DC
  Administered 2015-02-13 (×2): 2 [IU] via SUBCUTANEOUS

## 2015-02-12 MED ORDER — INSULIN GLARGINE 100 UNIT/ML ~~LOC~~ SOLN
25.0000 [IU] | Freq: Every day | SUBCUTANEOUS | Status: DC
  Administered 2015-02-12 – 2015-02-13 (×2): 25 [IU] via SUBCUTANEOUS
  Filled 2015-02-12 (×2): qty 0.25

## 2015-02-12 MED ORDER — ENOXAPARIN SODIUM 30 MG/0.3ML ~~LOC~~ SOLN: 30.0000 mg | SUBCUTANEOUS | Status: DC

## 2015-02-12 MED ORDER — ACETAMINOPHEN 325 MG PO TABS
650.0000 mg | ORAL_TABLET | Freq: Four times a day (QID) | ORAL | Status: DC | PRN
  Administered 2015-02-12 – 2015-02-13 (×4): 650 mg via ORAL
  Filled 2015-02-12 (×3): qty 2

## 2015-02-12 MED ORDER — GABAPENTIN 600 MG PO TABS
300.0000 mg | ORAL_TABLET | Freq: Two times a day (BID) | ORAL | Status: DC
  Administered 2015-02-12 – 2015-02-13 (×2): 300 mg via ORAL
  Filled 2015-02-12 (×2): qty 1

## 2015-02-12 MED ORDER — SODIUM CHLORIDE 0.9 % IJ SOLN: 3.0000 mL | Freq: Two times a day (BID) | INTRAMUSCULAR | Status: DC

## 2015-02-12 MED ORDER — LEVOFLOXACIN IN D5W 750 MG/150ML IV SOLN
750.0000 mg | INTRAVENOUS | Status: DC
  Administered 2015-02-12: 750 mg via INTRAVENOUS
  Filled 2015-02-12: qty 150

## 2015-02-12 MED ORDER — SENNOSIDES-DOCUSATE SODIUM 8.6-50 MG PO TABS: 1.0000 | ORAL_TABLET | Freq: Every evening | ORAL | Status: DC | PRN

## 2015-02-12 MED ORDER — LEVALBUTEROL HCL 0.63 MG/3ML IN NEBU: 0.6300 mg | INHALATION_SOLUTION | Freq: Four times a day (QID) | RESPIRATORY_TRACT | Status: DC | PRN

## 2015-02-12 MED ORDER — ENOXAPARIN SODIUM 80 MG/0.8ML ~~LOC~~ SOLN
80.0000 mg | Freq: Two times a day (BID) | SUBCUTANEOUS | Status: DC
  Administered 2015-02-13: 80 mg via SUBCUTANEOUS
  Filled 2015-02-12 (×3): qty 0.8

## 2015-02-12 MED ORDER — SODIUM CHLORIDE 0.9 % IV SOLN
INTRAVENOUS | Status: DC
  Administered 2015-02-13: 06:00:00 via INTRAVENOUS

## 2015-02-12 MED ORDER — SODIUM CHLORIDE 0.9 % IV BOLUS (SEPSIS)
1000.0000 mL | Freq: Once | INTRAVENOUS | Status: AC
  Administered 2015-02-12: 1000 mL via INTRAVENOUS

## 2015-02-12 MED ORDER — HYDRALAZINE HCL 20 MG/ML IJ SOLN: 10.0000 mg | Freq: Four times a day (QID) | INTRAMUSCULAR | Status: DC | PRN

## 2015-02-12 MED ORDER — ACETAMINOPHEN 650 MG RE SUPP: 650.0000 mg | Freq: Four times a day (QID) | RECTAL | Status: DC | PRN

## 2015-02-12 MED ORDER — ATENOLOL 25 MG PO TABS
12.5000 mg | ORAL_TABLET | Freq: Two times a day (BID) | ORAL | Status: DC
  Administered 2015-02-12 – 2015-02-13 (×2): 12.5 mg via ORAL
  Filled 2015-02-12 (×2): qty 1

## 2015-02-12 MED ORDER — PANTOPRAZOLE SODIUM 40 MG PO TBEC
40.0000 mg | DELAYED_RELEASE_TABLET | Freq: Every day | ORAL | Status: DC
  Administered 2015-02-13: 40 mg via ORAL
  Filled 2015-02-12: qty 1

## 2015-02-12 MED ORDER — FLUOXETINE HCL 20 MG PO CAPS
20.0000 mg | ORAL_CAPSULE | Freq: Every day | ORAL | Status: DC
  Administered 2015-02-12 – 2015-02-13 (×2): 20 mg via ORAL
  Filled 2015-02-12 (×2): qty 1

## 2015-02-12 MED ORDER — ONDANSETRON HCL 4 MG PO TABS: 4.0000 mg | ORAL_TABLET | Freq: Four times a day (QID) | ORAL | Status: DC | PRN

## 2015-02-12 MED ORDER — ONDANSETRON HCL 4 MG/2ML IJ SOLN: 4.0000 mg | Freq: Four times a day (QID) | INTRAMUSCULAR | Status: DC | PRN

## 2015-02-12 MED ORDER — OXYCODONE HCL 5 MG PO TABS
5.0000 mg | ORAL_TABLET | ORAL | Status: DC | PRN
  Administered 2015-02-12 – 2015-02-13 (×2): 5 mg via ORAL
  Filled 2015-02-12 (×2): qty 1

## 2015-02-12 NOTE — Progress Notes (Signed)
ANTICOAGULATION CONSULT NOTE - Initial Consult  Pharmacy Consult for Lovenox Indication: DVT  Allergies  Allergen Reactions  . Codeine   . Iodine   . Neosporin [Neomycin-Bacitracin Zn-Polymyx]   . Penicillins   . Tape     Patient Measurements: Height: 5\' 3"  (160 cm) (pt reported) Weight: 185 lb (83.915 kg) (pt reported) IBW/kg (Calculated) : 52.4  Vital Signs: Temp: 98.5 F (36.9 C) (10/05 1110) Temp Source: Oral (10/05 1110) BP: 153/74 mmHg (10/05 1500) Pulse Rate: 66 (10/05 1500)  Labs:  Recent Labs  02/12/15 1125  HGB 12.1  HCT 36.1  PLT 325  CREATININE 1.82*    Estimated Creatinine Clearance: 32.9 mL/min (by C-G formula based on Cr of 1.82).   Medical History: Past Medical History  Diagnosis Date  . Diabetes mellitus without complication ([place])   . Hypertension   . Goiter   . Chronic kidney disease   . Allergy   . Arthritis   . Neuropathy ([place])   . History of shingles 06/01/2013  . Abnormal EKG 07/31/2013    Medications:   (Not in a hospital admission)  Assessment: 64 yo F admitted with chest pain and dizziness. Cardiology has been consulted and has ordered a VQ scan for tomorrow to rule out PE. Plan is to give therapeutic Lovenox tonight, and then convert to prophylactic dose tomorrow pending results of the VQ scan.  Hgb 12.1, Plt 325  Goal of Therapy:  Monitor platelets by anticoagulation protocol: Yes   Plan:  Lovenox 80 mg subq q12h pending VQ scan F/U results of VQ scan and convert to prophylactic dose if negative.  Governor Specking, PharmD Clinical Pharmacy Resident Pager: [phone] 02/12/2015,5:10 PM

## 2015-02-12 NOTE — Progress Notes (Signed)
ANTIBIOTIC CONSULT NOTE - INITIAL  Pharmacy Consult for Levaquin Indication: UTI  Allergies  Allergen Reactions  . Codeine   . Iodine   . Neosporin [Neomycin-Bacitracin Zn-Polymyx]   . Penicillins   . Tape     Patient Measurements: Height: 5\' 3"  (160 cm) (pt reported) Weight: 185 lb (83.915 kg) (pt reported) IBW/kg (Calculated) : 52.4 Adjusted Body Weight:   Vital Signs: Temp: 98.4 F (36.9 C) (10/05 1835) Temp Source: Oral (10/05 1835) BP: 248/86 mmHg (10/05 1835) Pulse Rate: 69 (10/05 1835) Intake/Output from previous day:   Intake/Output from this shift:    Labs:  Recent Labs  02/12/15 1125 02/12/15 1709  WBC 9.6 8.2  HGB 12.1 12.8  PLT 325 323  CREATININE 1.82* 1.33*   Estimated Creatinine Clearance: 45 mL/min (by C-G formula based on Cr of 1.33). No results for input(s): VANCOTROUGH, VANCOPEAK, VANCORANDOM, GENTTROUGH, GENTPEAK, GENTRANDOM, TOBRATROUGH, TOBRAPEAK, TOBRARND, AMIKACINPEAK, AMIKACINTROU, AMIKACIN in the last 72 hours.   Microbiology: No results found for this or any previous visit (from the past 720 hour(s)).  Medical History: Past Medical History  Diagnosis Date  . Diabetes mellitus without complication ([place])   . Hypertension   . Goiter   . Chronic kidney disease   . Allergy   . Arthritis   . Neuropathy ([place])   . History of shingles 06/01/2013  . Abnormal EKG 07/31/2013    Medications:  Prescriptions prior to admission  Medication Sig Dispense Refill Last Dose  . albuterol (PROVENTIL HFA) 108 (90 BASE) MCG/ACT inhaler Inhale into the lungs every 6 (six) hours as needed for wheezing or shortness of breath.   rescue at rescue  . atenolol-chlorthalidone (TENORETIC) 50-25 MG per tablet TAKE ONE TABLET BY MOUTH ONCE DAILY. 30 tablet 5 02/12/2015 at Unknown time  . buPROPion (WELLBUTRIN SR) 150 MG 12 hr tablet TAKE ONE TABLET BY MOUTH TWICE DAILY 60 tablet 5 02/12/2015 at Unknown time  . cyclobenzaprine (FLEXERIL) 10 MG tablet TAKE ONE  TABLET BY MOUTH AT BEDTIME 30 tablet 1 02/11/2015 at Unknown time  . FLUoxetine (PROZAC) 20 MG capsule TAKE ONE CAPSULE BY MOUTH ONCE DAILY 30 capsule 5 02/12/2015 at Unknown time  . gabapentin (NEURONTIN) 300 MG capsule Three capsules at lunchtime; take three capsules at bedtime 180 capsule 5 02/12/2015 at Unknown time  . Insulin Detemir (LEVEMIR) 100 UNIT/ML Pen Inject 20-25 Units into the skin daily at 10 pm. 15 mL 11 02/11/2015 at Unknown time  . Insulin Pen Needle 32G X 6 MM MISC Use as directed 50 each 5 02/11/2015 at Unknown time  . metFORMIN (GLUCOPHAGE) 1000 MG tablet TAKE ONE TABLET BY MOUTH TWICE DAILY WITH MEALS 60 tablet 5 02/12/2015 at Unknown time  . omeprazole (PRILOSEC) 20 MG capsule Take 1 capsule (20 mg total) by mouth daily. 30 capsule 5 02/12/2015 at Unknown time  . traMADol (ULTRAM) 50 MG tablet Take 1 tablet by mouth in the afternoon and 2 tablets at bedtime. 90 tablet 5 02/12/2015 at Unknown time  . ciprofloxacin-dexamethasone (CIPRODEX) otic suspension Place 4 drops into the right ear 2 (two) times daily. (Patient not taking: Reported on 02/12/2015) 7.5 mL 0 Not Taking at Unknown time  . triamcinolone cream (KENALOG) 0.1 % Apply 1 application topically 2 (two) times daily. May mix 1:1 with eurerin cream at home (Patient not taking: Reported on 02/12/2015) 454 g 0 Completed Course at Unknown time   Scheduled:  . atenolol  12.5 mg Oral BID  . enoxaparin (LOVENOX) injection  80  mg Subcutaneous Q12H  . FLUoxetine  20 mg Oral Daily  . gabapentin  300 mg Oral BID  . insulin aspart  0-9 Units Subcutaneous TID WC  . insulin glargine  25 Units Subcutaneous Daily  . pantoprazole  40 mg Oral Daily  . sodium chloride  3 mL Intravenous Q12H   Infusions:  . sodium chloride     Assessment: 64yo female with history of DM2, obesity, HTN, GERD and nephropathy presents with CP. Pharmacy is consulted to dose levaquin for UTI. Pt is afebrile, WBC 8.2, sCr 1.33.  Goal of Therapy:  Eradication of  infection  Plan:  Levaquin 750mg  IV q48h (may increase frequency if CrCl continues to improve) Follow up culture results, renal function and clinical course  Andrey Cota. Diona Foley, PharmD Clinical Pharmacist Pager [phone] 02/12/2015,7:17 PM

## 2015-02-12 NOTE — ED Notes (Signed)
Pt ambulated to the bathroom with stand by assistance.

## 2015-02-12 NOTE — Discharge Instructions (Signed)
You have a Stress Test scheduled at [facility]. Your doctor has ordered this test to get a better idea of how your heart works. ° °Please arrive 15 minutes early for paperwork.  ° °Location: °[address], [address] °[place],  [address] °[phone] ° °Instructions: °· No food/drink after midnight the night before.  °· No caffeine/decaf products 24 hours before, including medicines such as Excedrin or Goody Powders. Call if there are any questions.  °· Wear comfortable clothes and shoes.  °· It is OK to take your morning meds with a sip of water EXCEPT for those types of medicines listed below or otherwise instructed. ° °Special Medication Instructions: °· Beta blockers such as metoprolol (Lopressor/Toprol XL), atenolol (Tenormin), carvedilol (Coreg), nebivolol (Bystolic), propranolol (Inderal) should not be taken for 24 hours before the test. °· Calcium channel blockers such as diltiazem (Cardizem) or verapmil (Calan) should not be taken for 24 hours before the test. °· Remove nitroglycerin patches and do not take nitrate preparations such as Imdur/isosorbide the day of your test. °· No Persantine/Theophylline or Aggrenox medicines should be used within 24 hours of the test.  ° °What To Expect: °The whole test will take several hours. When you arrive in the lab, the technician will inject a small amount of radioactive tracer into your arm through an IV while you are resting quietly. This helps us to form pictures of your heart. You will likely only feel a sting from the IV. After a waiting period, resting pictures will be obtained under a big camera. These are the "before" pictures. ° °Next, you will be prepped for the stress portion of the test. This may include either walking on a treadmill or receiving a medicine that helps to dilate blood vessels in your heart to simulate the effect of exercise on your heart. If you are walking on a treadmill, you will walk at different paces to  try to get your heart rate to a goal number that is based on your age. If your doctor has chosen the pharmacologic test, then you will receive a medicine through your IV that may cause temporary nausea, flushing, shortness of breath and sometimes chest discomfort or vomiting. This is typically short-lived and usually resolves quickly. Your blood pressure and heart rate will be monitored, and we will be watching your EKG on a computer screen for any changes. During this portion of the test, the radiologist will inject another small amount of radioactive tracer into your IV. After a waiting period, you will undergo a second set of pictures. These are the "after" pictures. ° °The doctor reading the test will compare the before-and-after images to look for evidence of heart blockages or heart weakness. In certain instances, this test is done over 2 days but usually only takes 1 day to complete. ° °

## 2015-02-12 NOTE — ED Notes (Signed)
Pt cbg 110 

## 2015-02-12 NOTE — Consult Note (Signed)
CARDIOLOGY CONSULT NOTE   Patient ID: Judith [name] MRN: [id number], DOB/AGE: 62-Nov-1952   Admit date: 02/12/2015 Date of Consult: 02/12/2015 Reason for  Consult: Chest Pain   Primary Physician: Ellsworth Lennox, MD Primary Cardiologist: Dr. Fransico Him  HPI: Judith [name] is a 64 y.o. female with past medical history of Type 2 DM, GERD, tobacco abuse and HTN who presents to [facility] ED on 02/12/2015 for dizziness and chest pain.   She reports presenting to work this morning and started to feel dizzy around 7:00AM with ambulation. She reports "feeling as the room was spinning" and had some nausea at that time. About one hour later, she developed sternal chest pain that she reports was more of a pressure and crushing sensation. She did have some shooting pain down her right arm for several minutes as well.   Upon arrival to the ED and lying down on the bed, she reports her chest pain, radiating pain, and dizziness resolved. She never took any SL NTG for the pain. An initial troponin was negative and her EKG was without acute changes. CXR showed no acute abnormalities. Was noted to be hyponatremic, have elevated glucose of 260, and creatinine elevated to 1.82 (baseline between 0.8 -1.0).  She reports having recent vomiting over the past two days and has been unable to keep down most solid foods. She also had a tooth extraction two days ago as well. She denies any recent chest pain over the past several months except for the episode this morning. She does report having some dyspnea with carrying heavy objects up stairs, but no dyspnea with other activities or at rest.  Last seen in office by Dr. Radford Pax in 08/2013 for chest pain evaluation and was going to have a nuclear stress test but the patient cancelled her test due to her pain being relieved with Prevacid. She has been taking Prilosec daily since that visit. Reports having two stress tests in [place], with the most recent being in 2013 and  reports it was "normal".  Last echocardiogram was in 08/2013 and showed mild LVH, focal basal hypertrophy, EF of 55-60% with no wall motion abnormalities. Mild aortic stenosis noted.   Problem List Past Medical History  Diagnosis Date  . Diabetes mellitus without complication ([place])   . Hypertension   . Goiter   . Chronic kidney disease   . Allergy   . Arthritis   . Neuropathy ([place])   . History of shingles 06/01/2013  . Abnormal EKG 07/31/2013    Past Surgical History  Procedure Laterality Date  . Tonsillectomy    . Cesarean section    . Abdominal hysterectomy    . Carpal tunnel release Right Nov 2015  . Right knee    . Right shoulder    . Foot neuroma surgery    . Carpel tunnel syndrome Left Dec 2015  . Carpel tunnel release Right 1992    [name]     Allergies Allergies  Allergen Reactions  . Codeine   . Iodine   . Neosporin [Neomycin-Bacitracin Zn-Polymyx]   . Penicillins   . Tape       Inpatient Medications    Family History Family History  Problem Relation Age of Onset  . Cancer Mother     bronchial cancer  . Hypertension Father   . COPD Father   . Heart disease Father   . Heart attack Father   . Allergies Sister   . Breast cancer Maternal Grandmother   . Emphysema Maternal  Grandfather   . Leukemia Paternal Grandmother   . Emphysema Paternal Grandfather      Social History Social History   Social History  . Marital Status: Divorced    Spouse Name: N/A  . Number of Children: N/A  . Years of Education: N/A   Occupational History  . custodian    Social History Main Topics  . Smoking status: Current Every Day Smoker -- 0.50 packs/day for 41 years    Types: Cigarettes  . Smokeless tobacco: Not on file  . Alcohol Use: No  . Drug Use: No  . Sexual Activity: No   Other Topics Concern  . Not on file   Social History Narrative   Lives with sister in [place].   Divorced twice.     Review of Systems General:  No chills,  fever, night sweats or weight changes.  Cardiovascular:  No dyspnea on exertion, edema, orthopnea, palpitations, paroxysmal nocturnal dyspnea. Positive for chest pain. Dermatological: No rash, lesions/masses Respiratory: No cough, dyspnea Urologic: No hematuria, dysuria Abdominal:   No diarrhea, bright red blood per rectum, melena, or hematemesis. Positive for nausea and vomiting. Neurologic:  No visual changes, wkns, changes in mental status. Positive for dizziness. All other systems reviewed and are otherwise negative except as noted above.  Physical Exam Blood pressure 153/74, pulse 66, temperature 98.5 F (36.9 C), temperature source Oral, resp. rate 10, height 5\' 3"  (1.6 m), weight 185 lb (83.915 kg), SpO2 98 %.  General: Pleasant, Caucasian female in NAD Psych: Normal affect. Neuro: Alert and oriented X 3. Moves all extremities spontaneously. HEENT: Normal  Neck: Supple without bruits or JVD. Lungs:  Resp regular and unlabored, CTA without wheezing or rales. Heart: RRR no s3, s4, or murmurs. Reports feeling "nauseated" with palpation along sternum. Abdomen: Soft, non-tender, non-distended, BS + x 4.  Extremities: No clubbing, cyanosis or edema. DP/PT/Radials 2+ and equal bilaterally.  Labs  No results for input(s): CKTOTAL, CKMB, TROPONINI in the last 72 hours. Lab Results  Component Value Date   WBC 9.6 02/12/2015   HGB 12.1 02/12/2015   HCT 36.1 02/12/2015   MCV 86.0 02/12/2015   PLT 325 02/12/2015     Recent Labs Lab 02/12/15 1125  NA 129*  K 3.7  CL 85*  CO2 27  BUN 26*  CREATININE 1.82*  CALCIUM 8.8*  GLUCOSE 260*   Lab Results  Component Value Date   CHOL 236* 12/06/2014   HDL 29* 12/06/2014   LDLCALC NOT CALC 12/06/2014   TRIG 993* 12/06/2014    Radiology/Studies Dg Chest 2 View: 02/12/2015   CLINICAL DATA:  Shortness of breath for 1 day  EXAM: CHEST  2 VIEW  COMPARISON:  April 12, 2014  FINDINGS: There is no edema or consolidation. The heart size  and pulmonary vascularity are normal. No adenopathy. No bone lesions are appreciable.  IMPRESSION: No edema or consolidation.   Electronically Signed   By: Lowella Grip III M.D.   On: 02/12/2015 13:52    ECG: NSR with rate in 70's. No acute changes since previous tracing.  ECHOCARDIOGRAM: 08/2013 Study Conclusions - Left ventricle: The cavity size was normal. Wall thickness was increased in a pattern of mild LVH. There was focal basal hypertrophy. Systolic function was normal. The estimated ejection fraction was in the range of 55% to 60%. Wall motion was normal; there were no regional wall motion abnormalities. There was an increased relative contribution of atrial contraction to ventricular filling. - Aortic valve: There  was very mild stenosis. - Left atrium: The atrium was mildly dilated. - Pericardium, extracardiac: A trivial pericardial effusion was identified.  ASSESSMENT AND PLAN  1. Atypical Chest Pain - developed after feeling dizzy and nauseated. Was relieved with lying down and receiving IV fluids. Reports having nausea with chest palpation. However, she does have several cardiac risk factors (HTN, Type 2 DM, tobacco cessation). - EKG without acute changes and initial troponin negative. - echocardiogram is pending. - cycle enzymes overnight. Obtain EKG in AM. If enzymes remain negative and she remains without chest pain, we will arrange for an outpatient nuclear stress test. Will send message to [place] to schedule for next week.  2. Essential HTN - BP has been 99/57 - 153/74 while in the ED - continue Atenolol. Chlorthalidone on hold due to AKI.  3. AKI - likely secondary to viral gastroenteritis  - creatinine elevated to 1.82, baseline around 0.8 - 1.0.  Otherwise, per admitting team.   Signed, Erma Heritage, PA-C 02/12/2015, 5:10 PM Pager: [phone]  Patient seen, examined. Available data reviewed. Agree with findings,  assessment, and plan as outlined by Bernerd Pho, PA-C. Exam reveals a pleasant woman in NAD, lungs CTA, CV RRR without murmur, no peripheral edema. EKG and troponin initially are normal. Chest pain is somewhat atypical and associated with anxiety related to severe dizziness. I suspect all of her symptoms relate to dehydration/GI illness. With multiple CV risk factors, I think it is prudent to check a Myoview stress test but would do this as an outpatient after she recovers.   [name], M.D. 02/12/2015 6:08 PM

## 2015-02-12 NOTE — ED Provider Notes (Signed)
CSN: [id number]     Arrival date & time 02/12/15  1107 History   First MD Initiated Contact with Patient 02/12/15 1115     Chief Complaint  Patient presents with  . Chest Pain  . Dizziness     (Consider location/radiation/quality/duration/timing/severity/associated sxs/prior Treatment) Patient is a 64 y.o. female presenting with chest pain and dizziness. The history is provided by the patient.  Chest Pain Pain location:  Substernal area and L chest Pain quality: crushing and tightness   Pain radiates to:  Does not radiate Pain radiates to the back: no   Pain severity:  Mild Timing:  Constant Progression:  Improving Chronicity:  New Context: breathing   Relieved by:  Nothing Worsened by:  Nothing tried Associated symptoms: anxiety, dizziness, shortness of breath and vomiting   Associated symptoms: no abdominal pain, no cough, no fever and no nausea   Risk factors: diabetes mellitus, hypertension and smoking   Dizziness Associated symptoms: chest pain, shortness of breath and vomiting   Associated symptoms: no diarrhea and no nausea    PMH significant for DM, HTN, obesity, GERD, tobacco abuse, neuropathy, depression and anxiety.  She was at work today and has some substernal chest pain that was constant in nature. She has a history of angina but has not used any nitroglycerin in the past year. The pain was constant and localized. Described as a pressure and a heavy sensation on her chest area she has not taken any nitroglycerin up to this point. She is having some SOB and feels like she is unable to take a deep breath. She denies any nausea, constipation, diarrhea, dysuria, back pain, coughing, unilateral leg swelling, or hemoptysis. She denies any recent travel. Had 9 episodes of vomiting on Monday and one episode on Tuesday.  She has not been taking her normal dose of insulin as she has run out of needles.   She smoke 1/2 PPD. Denies any EtOH or illicit drug use. She recently had a  tooth extraction 2 days ago and is having some right ear pain.  Past Medical History  Diagnosis Date  . Diabetes mellitus without complication ([place])   . Hypertension   . Goiter   . Chronic kidney disease   . Allergy   . Arthritis   . Neuropathy ([place])   . History of shingles 06/01/2013  . Abnormal EKG 07/31/2013   Past Surgical History  Procedure Laterality Date  . Tonsillectomy    . Cesarean section    . Abdominal hysterectomy    . Carpal tunnel release Right Nov 2015  . Right knee    . Right shoulder    . Foot neuroma surgery    . Carpel tunnel syndrome Left Dec 2015  . Carpel tunnel release Right 1992    [name]   Family History  Problem Relation Age of Onset  . Cancer Mother     bronchial cancer  . Hypertension Father   . COPD Father   . Heart disease Father   . Heart attack Father   . Allergies Sister   . Breast cancer Maternal Grandmother   . Emphysema Maternal Grandfather   . Leukemia Paternal Grandmother   . Emphysema Paternal Grandfather    Social History  Substance Use Topics  . Smoking status: Current Every Day Smoker  . Smokeless tobacco: None  . Alcohol Use: No   OB History    No data available     Review of Systems  Constitutional: Negative for fever.  Respiratory:  Positive for shortness of breath. Negative for cough.   Cardiovascular: Positive for chest pain.  Gastrointestinal: Positive for vomiting. Negative for nausea, abdominal pain, diarrhea and constipation.  Skin: Negative for rash.  Neurological: Positive for dizziness.      Allergies  Codeine; Iodine; Neosporin; Penicillins; and Tape  Home Medications   Prior to Admission medications   Medication Sig Start Date End Date Taking? Authorizing Provider  albuterol (PROVENTIL HFA) 108 (90 BASE) MCG/ACT inhaler Inhale into the lungs every 6 (six) hours as needed for wheezing or shortness of breath.   Yes Historical Provider, MD  atenolol-chlorthalidone (TENORETIC) 50-25 MG per tablet  TAKE ONE TABLET BY MOUTH ONCE DAILY. 06/25/14  Yes Barton Fanny, MD  buPROPion [facility] SR) 150 MG 12 hr tablet TAKE ONE TABLET BY MOUTH TWICE DAILY 09/28/14  Yes Barton Fanny, MD  cyclobenzaprine (FLEXERIL) 10 MG tablet TAKE ONE TABLET BY MOUTH AT BEDTIME 10/21/14  Yes [name], PA-C  FLUoxetine (PROZAC) 20 MG capsule TAKE ONE CAPSULE BY MOUTH ONCE DAILY 09/28/14  Yes Barton Fanny, MD  gabapentin (NEURONTIN) 300 MG capsule Three capsules at lunchtime; take three capsules at bedtime 12/06/14  Yes [name], MD  Insulin Detemir (LEVEMIR) 100 UNIT/ML Pen Inject 20-25 Units into the skin daily at 10 pm. 09/28/14  Yes Barton Fanny, MD  Insulin Pen Needle 32G X 6 MM MISC Use as directed 09/28/14  Yes Barton Fanny, MD  metFORMIN (GLUCOPHAGE) 1000 MG tablet TAKE ONE TABLET BY MOUTH TWICE DAILY WITH MEALS 09/28/14  Yes Barton Fanny, MD  omeprazole (PRILOSEC) 20 MG capsule Take 1 capsule (20 mg total) by mouth daily. 09/28/14  Yes Barton Fanny, MD  traMADol (ULTRAM) 50 MG tablet Take 1 tablet by mouth in the afternoon and 2 tablets at bedtime. 12/30/14  Yes [name], MD  ciprofloxacin-dexamethasone [facility]) otic suspension Place 4 drops into the right ear 2 (two) times daily. Patient not taking: Reported on 02/12/2015 12/03/14   Thao P Le, DO  triamcinolone cream (KENALOG) 0.1 % Apply 1 application topically 2 (two) times daily. May mix 1:1 with eurerin cream at home Patient not taking: Reported on 02/12/2015 04/02/14   [name], PA-C   BP 153/74 mmHg  Pulse 66  Temp(Src) 98.5 F (36.9 C) (Oral)  Resp 10  SpO2 98% Physical Exam  Constitutional: She is oriented to person, place, and time. She appears well-developed and well-nourished.  HENT:  Head: Normocephalic and atraumatic.  Eyes: Conjunctivae and EOM are normal.  Neck: Normal range of motion. Neck supple.  Cardiovascular: Normal rate, regular rhythm and normal heart sounds.   No  murmur heard. Pulmonary/Chest: Effort normal and breath sounds normal. No respiratory distress. She has no wheezes.  Abdominal: Soft. Bowel sounds are normal. She exhibits no distension. There is no tenderness. There is no rebound.  Musculoskeletal: Normal range of motion. She exhibits no edema.  Neurological: She is alert and oriented to person, place, and time.  Skin: Skin is warm. No rash noted.  Psychiatric: She has a normal mood and affect.    ED Course  Procedures (including critical care time) Labs Review Labs Reviewed  BASIC METABOLIC PANEL - Abnormal; Notable for the following:    Sodium 129 (*)    Chloride 85 (*)    Glucose, Bld 260 (*)    BUN 26 (*)    Creatinine, Ser 1.82 (*)    Calcium 8.8 (*)    GFR  calc non Af Amer 29 (*)    GFR calc Af Amer 33 (*)    Anion gap 17 (*)    All other components within normal limits  URINALYSIS, ROUTINE W REFLEX MICROSCOPIC (NOT AT [facility]) - Abnormal; Notable for the following:    Glucose, UA 500 (*)    Protein, ur 30 (*)    Leukocytes, UA MODERATE (*)    All other components within normal limits  URINE MICROSCOPIC-ADD ON - Abnormal; Notable for the following:    Squamous Epithelial / LPF FEW (*)    Bacteria, UA FEW (*)    All other components within normal limits  CBG MONITORING, ED - Abnormal; Notable for the following:    Glucose-Capillary 266 (*)    All other components within normal limits  URINE CULTURE  CBC  BLOOD GAS, VENOUS  I-STAT TROPOININ, ED    Imaging Review Dg Chest 2 View  02/12/2015   CLINICAL DATA:  Shortness of breath for 1 day  EXAM: CHEST  2 VIEW  COMPARISON:  April 12, 2014  FINDINGS: There is no edema or consolidation. The heart size and pulmonary vascularity are normal. No adenopathy. No bone lesions are appreciable.  IMPRESSION: No edema or consolidation.   Electronically Signed   By: Lowella Grip III M.D.   On: 02/12/2015 13:52   I have personally reviewed and evaluated these images and lab  results as part of my medical decision-making.   EKG Interpretation   Date/Time:  Wednesday February 12 2015 11:17:10 EDT Ventricular Rate:  78 PR Interval:  170 QRS Duration: 74 QT Interval:  410 QTC Calculation: 467 R Axis:   -24 Text Interpretation:  Normal sinus rhythm Low voltage QRS Cannot rule out  Inferior infarct , age undetermined Cannot rule out Anterior infarct , age  undetermined Abnormal ECG Poor R wave progression through precordial leads  Confirmed by LITTLE MD, RACHEL [phone]) on 02/12/2015 12:32:14 PM      MDM   Final diagnoses:  Other chest pain    Chest pain most likely MSK vs anxiety.  Her Heart score of 3-4. Troponin is negative and CXR normal. No travel, unilateral leg swelling, tachycardia to suggest. Did not send for D-dimer as with her AKI she would need a VQ scan.   AKI most likely 2/2 to her recent episode of emesis resulting in dehydration. She was given 2L NS bolus.    Dizziness could be 2/2 to orthostatic hypotension. She had a drop in her systolic BP.   Will plan to admit for AKI and chest pain observation.   Rosemarie Ax, MD PGY-3, [facility] 02/12/2015, 4:29 PM    Rosemarie Ax, MD 02/12/15 [place], MD 02/12/15 2109

## 2015-02-12 NOTE — ED Notes (Signed)
Pt states at 0700 started having dizziness intermittently and then developed chest pain

## 2015-02-12 NOTE — H&P (Addendum)
[facility] History and Physical  Judith [name] QIW:[id number] DOB: 08-27-1950 DOA: 02/12/2015  Referring physician:  PCP: Ellsworth Lennox, MD   Chief Complaint:    HPI:  64 year old female with a history of diabetes, morbid obesity, hypertension, gastroesophageal reflux disease, tobacco abuse, nephropathy, depression and anxiety presents to the ER with chest pain. Patient has not been feeling well since Monday with multiple episodes of vomiting but no diarrhea. The patient started developing dizziness this morning with ambulation and chest tightness. The patient had dental work done 3 days ago and has not smoked since Monday. She denies any fever but has had a nonproductive cough since the beginning of the week. She denies any hemoptysis. She has not had any syncopal episodes. She smokes about half a pack of day. She is complaining of dental pain today and is requesting pain medication Workup in the ER revealed creatinine of 1.82 which is gone up from 1.0 in June. Sodium 129. No history of hyponatremia Patient is being admitted for acute kidney injury chest pain rule out, COPD exacerbation and possible viral gastroenteritis      Review of Systems: negative for the following  Constitutional: Denies fever, chills, diaphoresis, appetite change and fatigue.  HEENT: Denies photophobia, eye pain, redness, hearing loss, ear pain, congestion, sore throat, rhinorrhea, sneezing, mouth sores, trouble swallowing, neck pain, neck stiffness and tinnitus.  Respiratory: Denies SOB, DOE, cough, positive for chest tightness, and wheezing.  Cardiovascular: Positive for chest pain, palpitations and leg swelling.  Gastrointestinal: Denies nausea, vomiting, abdominal pain, diarrhea, constipation, blood in stool and abdominal distention.  Genitourinary: Denies dysuria, urgency, frequency, hematuria, flank pain and difficulty urinating.  Musculoskeletal: Denies myalgias, back pain, joint swelling,  arthralgias and gait problem.  Skin: Denies pallor, rash and wound.  Neurological: Positive for dizziness, seizures, syncope, weakness, light-headedness, numbness and headaches.  Hematological: Denies adenopathy. Easy bruising, personal or family bleeding history  Psychiatric/Behavioral: Denies suicidal ideation, mood changes, confusion, nervousness, sleep disturbance and agitation       Past Medical History  Diagnosis Date  . Diabetes mellitus without complication ([place])   . Hypertension   . Goiter   . Chronic kidney disease   . Allergy   . Arthritis   . Neuropathy ([place])   . History of shingles 06/01/2013  . Abnormal EKG 07/31/2013     Past Surgical History  Procedure Laterality Date  . Tonsillectomy    . Cesarean section    . Abdominal hysterectomy    . Carpal tunnel release Right Nov 2015  . Right knee    . Right shoulder    . Foot neuroma surgery    . Carpel tunnel syndrome Left Dec 2015  . Carpel tunnel release Right 1992    [name]      Social History:  reports that she has been smoking.  She does not have any smokeless tobacco history on file. She reports that she does not drink alcohol or use illicit drugs.    Allergies  Allergen Reactions  . Codeine   . Iodine   . Neosporin [Neomycin-Bacitracin Zn-Polymyx]   . Penicillins   . Tape     Family History  Problem Relation Age of Onset  . Cancer Mother     bronchial cancer  . Hypertension Father   . COPD Father   . Heart disease Father   . Heart attack Father   . Allergies Sister   . Breast cancer Maternal Grandmother   . Emphysema Maternal Grandfather   . Leukemia  Paternal Grandmother   . Emphysema Paternal Grandfather          Prior to Admission medications   Medication Sig Start Date End Date Taking? Authorizing Provider  albuterol (PROVENTIL HFA) 108 (90 BASE) MCG/ACT inhaler Inhale into the lungs every 6 (six) hours as needed for wheezing or shortness of breath.   Yes Historical Provider,  MD  atenolol-chlorthalidone (TENORETIC) 50-25 MG per tablet TAKE ONE TABLET BY MOUTH ONCE DAILY. 06/25/14  Yes Barton Fanny, MD  buPROPion [facility] SR) 150 MG 12 hr tablet TAKE ONE TABLET BY MOUTH TWICE DAILY 09/28/14  Yes Barton Fanny, MD  cyclobenzaprine (FLEXERIL) 10 MG tablet TAKE ONE TABLET BY MOUTH AT BEDTIME 10/21/14  Yes [name], PA-C  FLUoxetine (PROZAC) 20 MG capsule TAKE ONE CAPSULE BY MOUTH ONCE DAILY 09/28/14  Yes Barton Fanny, MD  gabapentin (NEURONTIN) 300 MG capsule Three capsules at lunchtime; take three capsules at bedtime 12/06/14  Yes [name], MD  Insulin Detemir (LEVEMIR) 100 UNIT/ML Pen Inject 20-25 Units into the skin daily at 10 pm. 09/28/14  Yes Barton Fanny, MD  Insulin Pen Needle 32G X 6 MM MISC Use as directed 09/28/14  Yes Barton Fanny, MD  metFORMIN (GLUCOPHAGE) 1000 MG tablet TAKE ONE TABLET BY MOUTH TWICE DAILY WITH MEALS 09/28/14  Yes Barton Fanny, MD  omeprazole (PRILOSEC) 20 MG capsule Take 1 capsule (20 mg total) by mouth daily. 09/28/14  Yes Barton Fanny, MD  traMADol (ULTRAM) 50 MG tablet Take 1 tablet by mouth in the afternoon and 2 tablets at bedtime. 12/30/14  Yes [name], MD  ciprofloxacin-dexamethasone [facility]) otic suspension Place 4 drops into the right ear 2 (two) times daily. Patient not taking: Reported on 02/12/2015 12/03/14   Thao P Le, DO  triamcinolone cream (KENALOG) 0.1 % Apply 1 application topically 2 (two) times daily. May mix 1:1 with eurerin cream at home Patient not taking: Reported on 02/12/2015 04/02/14   [name], PA-C     Physical Exam: Filed Vitals:   02/12/15 1300 02/12/15 1315 02/12/15 1330 02/12/15 1500  BP: 102/59 119/59 124/59 153/74  Pulse: 67 70 69 66  Temp:      TempSrc:      Resp:  18  10  SpO2: 99% 95% 97% 98%     Constitutional: Vital signs reviewed. Patient is a well-developed and well-nourished in no acute distress and cooperative with exam. Alert  and oriented x3.  Head: Normocephalic and atraumatic  Ear: TM normal bilaterally  Mouth: no erythema or exudates, MMM  Eyes: PERRL, EOMI, conjunctivae normal, No scleral icterus.  Neck: Supple, Trachea midline normal ROM, No JVD, mass, thyromegaly, or carotid bruit present.  Cardiovascular: RRR, S1 normal, S2 normal, no MRG, pulses symmetric and intact bilaterally  Pulmonary/Chest: CTAB, I basilar rhonchi Abdominal: Soft. Non-tender, non-distended, bowel sounds are normal, no masses, organomegaly, or guarding present.  GU: no CVA tenderness Musculoskeletal: No joint deformities, erythema, or stiffness, ROM full and no nontender Ext: no edema and no cyanosis, pulses palpable bilaterally (DP and PT)  Hematology: no cervical, inginal, or axillary adenopathy.  Neurological: A&O x3, Strenght is normal and symmetric bilaterally, cranial nerve II-XII are grossly intact, no focal motor deficit, sensory intact to light touch bilaterally.  Skin: Warm, dry and intact. No rash, cyanosis, or clubbing.  Psychiatric: Normal mood and affect. speech and behavior is normal. Judgment and thought content normal. Cognition and memory are normal.  Data Review   Micro Results No results found for this or any previous visit (from the past 240 hour(s)).  Radiology Reports Dg Chest 2 View  02/12/2015   CLINICAL DATA:  Shortness of breath for 1 day  EXAM: CHEST  2 VIEW  COMPARISON:  April 12, 2014  FINDINGS: There is no edema or consolidation. The heart size and pulmonary vascularity are normal. No adenopathy. No bone lesions are appreciable.  IMPRESSION: No edema or consolidation.   Electronically Signed   By: Lowella Grip III M.D.   On: 02/12/2015 13:52     CBC  Recent Labs Lab 02/12/15 1125  WBC 9.6  HGB 12.1  HCT 36.1  PLT 325  MCV 86.0  MCH 28.8  MCHC 33.5  RDW 13.4    Chemistries   Recent Labs Lab 02/12/15 1125  NA 129*  K 3.7  CL 85*  CO2 27  GLUCOSE 260*  BUN 26*   CREATININE 1.82*  CALCIUM 8.8*   ------------------------------------------------------------------------------------------------------------------ CrCl cannot be calculated (Unknown ideal weight.). ------------------------------------------------------------------------------------------------------------------ No results for input(s): HGBA1C in the last 72 hours. ------------------------------------------------------------------------------------------------------------------ No results for input(s): CHOL, HDL, LDLCALC, TRIG, CHOLHDL, LDLDIRECT in the last 72 hours. ------------------------------------------------------------------------------------------------------------------ No results for input(s): TSH, T4TOTAL, T3FREE, THYROIDAB in the last 72 hours.  Invalid input(s): FREET3 ------------------------------------------------------------------------------------------------------------------ No results for input(s): VITAMINB12, FOLATE, FERRITIN, TIBC, IRON, RETICCTPCT in the last 72 hours.  Coagulation profile No results for input(s): INR, PROTIME in the last 168 hours.  No results for input(s): DDIMER in the last 72 hours.  Cardiac Enzymes No results for input(s): CKMB, TROPONINI, MYOGLOBIN in the last 168 hours.  Invalid input(s): CK ------------------------------------------------------------------------------------------------------------------ Invalid input(s): POCBNP   CBG:  Recent Labs Lab 02/12/15 1114  GLUCAP 266*       EKG: Independently reviewed. *Ventricular Rate: 78 PR Interval: 170 QRS Duration: 74 QT Interval: 410 QTC Calculation: 467 R Axis: -24 Text Interpretation: Normal sinus rhythm Low voltage QRS Cannot rule out  Inferior infarct , age undetermined   Assessment/Plan Principal Problem:   Chest pain syndrome-pleuritic in nature, likely secondary to pleurisy in the setting of viral URI, patient will be admitted to telemetry, continue  to cycle cardiac enzymes, obtain 2-D echo to rule out wall motion abnormalities, patient has never had a cardiac cath, confirmed by the patient's partner. Cardiology consulted for further recommendations, troponin negative 1, will check a d-dimer if elevated will order a VQ scan tomorrow. In the meantime of d-dimer is elevated will cover with Lovenox tonight, although   clinically low suspicion for PE  Acute kidney injury-likely secondary to viral gastroenteritis and intractable vomiting hopefully this should correct, baseline is around 1.0  UTI- levaquin for 5 days   Acute hyponatremia-patient is likely dehydrated because of intractable nausea as well as because of chlorthalidone, will hold chlorthalidone and hydrated with normal saline and see the sodium improves  Vital gastroenteritis-patient will be started on aggressive IV hydration, no diarrhea there for no stool studies have been ordered    Diabetes (HCC)-continue Lantus and started on sliding scale insulin, hold Glucophage     HTN (hypertension)-hold chlorthalidone and continue atenolol    Diabetic neuropathy, painful (HCC)-continue gabapentin    Depression with anxiety-continue Prozac    GERD (gastroesophageal reflux disease)-continue PPI  Code Status:   full Family Communication: bedside Disposition Plan: admit   Total time spent 55 minutes.Greater than 50% of this time was spent in counseling, explanation of diagnosis, planning of further management, and coordination of care  [facility] Pager [phone]  If 7PM-7AM, please contact night-coverage [email or web address] Password [email or web address] 02/12/2015, 4:48 PM

## 2015-02-13 ENCOUNTER — Encounter (HOSPITAL_COMMUNITY): Payer: Self-pay | Admitting: General Practice

## 2015-02-13 ENCOUNTER — Ambulatory Visit (HOSPITAL_BASED_OUTPATIENT_CLINIC_OR_DEPARTMENT_OTHER): Payer: Commercial Managed Care - HMO

## 2015-02-13 ENCOUNTER — Other Ambulatory Visit: Payer: Self-pay | Admitting: Physician Assistant

## 2015-02-13 DIAGNOSIS — E118 Type 2 diabetes mellitus with unspecified complications: Secondary | ICD-10-CM

## 2015-02-13 DIAGNOSIS — F418 Other specified anxiety disorders: Secondary | ICD-10-CM | POA: Diagnosis not present

## 2015-02-13 DIAGNOSIS — N179 Acute kidney failure, unspecified: Secondary | ICD-10-CM | POA: Diagnosis not present

## 2015-02-13 DIAGNOSIS — N390 Urinary tract infection, site not specified: Secondary | ICD-10-CM

## 2015-02-13 DIAGNOSIS — R079 Chest pain, unspecified: Secondary | ICD-10-CM

## 2015-02-13 DIAGNOSIS — R0789 Other chest pain: Secondary | ICD-10-CM

## 2015-02-13 DIAGNOSIS — E871 Hypo-osmolality and hyponatremia: Secondary | ICD-10-CM | POA: Diagnosis not present

## 2015-02-13 DIAGNOSIS — I10 Essential (primary) hypertension: Secondary | ICD-10-CM

## 2015-02-13 DIAGNOSIS — E1140 Type 2 diabetes mellitus with diabetic neuropathy, unspecified: Secondary | ICD-10-CM

## 2015-02-13 LAB — GLUCOSE, CAPILLARY
GLUCOSE-CAPILLARY: 169 mg/dL — AB (ref 65–99)
Glucose-Capillary: 155 mg/dL — ABNORMAL HIGH (ref 65–99)

## 2015-02-13 LAB — COMPREHENSIVE METABOLIC PANEL
ALBUMIN: 3.1 g/dL — AB (ref 3.5–5.0)
ALT: 11 U/L — AB (ref 14–54)
AST: 16 U/L (ref 15–41)
Alkaline Phosphatase: 75 U/L (ref 38–126)
Anion gap: 10 (ref 5–15)
BILIRUBIN TOTAL: 0.5 mg/dL (ref 0.3–1.2)
BUN: 18 mg/dL (ref 6–20)
CHLORIDE: 97 mmol/L — AB (ref 101–111)
CO2: 27 mmol/L (ref 22–32)
CREATININE: 0.97 mg/dL (ref 0.44–1.00)
Calcium: 8.1 mg/dL — ABNORMAL LOW (ref 8.9–10.3)
GFR calc Af Amer: >60 mL/min (ref >60)
GLUCOSE: 207 mg/dL — AB (ref 65–99)
POTASSIUM: 4.1 mmol/L (ref 3.5–5.1)
Sodium: 134 mmol/L — ABNORMAL LOW (ref 135–145)
Total Protein: 6.2 g/dL — ABNORMAL LOW (ref 6.5–8.1)

## 2015-02-13 LAB — CBC
HCT: 34.7 % — ABNORMAL LOW (ref 36.0–46.0)
Hemoglobin: 11.6 g/dL — ABNORMAL LOW (ref 12.0–15.0)
MCH: 29.1 pg (ref 26.0–34.0)
MCHC: 33.4 g/dL (ref 30.0–36.0)
MCV: 87.2 fL (ref 78.0–100.0)
Platelets: 286 10*3/uL (ref 150–400)
RBC: 3.98 MIL/uL (ref 3.87–5.11)
RDW: 13.3 % (ref 11.5–15.5)
WBC: 6.9 10*3/uL (ref 4.0–10.5)

## 2015-02-13 LAB — HEMOGLOBIN A1C
HEMOGLOBIN A1C: 10.4 % — AB (ref 4.8–5.6)
MEAN PLASMA GLUCOSE: 252 mg/dL

## 2015-02-13 LAB — TROPONIN I: Troponin I: <0.03 ng/mL (ref <0.031)

## 2015-02-13 MED ORDER — INFLUENZA VAC SPLIT QUAD 0.5 ML IM SUSY
0.5000 mL | PREFILLED_SYRINGE | Freq: Once | INTRAMUSCULAR | Status: AC
  Administered 2015-02-13: 0.5 mL via INTRAMUSCULAR
  Filled 2015-02-13: qty 0.5

## 2015-02-13 MED ORDER — INFLUENZA VAC SPLIT QUAD 0.5 ML IM SUSY: 0.5000 mL | PREFILLED_SYRINGE | INTRAMUSCULAR | Status: DC

## 2015-02-13 MED ORDER — ENOXAPARIN SODIUM 40 MG/0.4ML ~~LOC~~ SOLN: 40.0000 mg | SUBCUTANEOUS | Status: DC

## 2015-02-13 MED ORDER — CIPROFLOXACIN HCL 500 MG PO TABS: 500.0000 mg | ORAL_TABLET | Freq: Two times a day (BID) | ORAL | Status: DC

## 2015-02-13 NOTE — Progress Notes (Signed)
Patient Name: Judith [name] Date of Encounter: 02/13/2015  Principal Problem:   Chest pain syndrome Active Problems:   Diabetes ([place])   HTN (hypertension)   Diabetic neuropathy, painful (HCC)   Depression with anxiety   GERD (gastroesophageal reflux disease)   Chest pain   Acute kidney injury ([place])   Acute hyponatremia   Pt. Profile:  Judith [name] is a 64 y.o. female with past medical history of Type 2 DM, GERD, tobacco abuse and HTN who presents to [facility] ED on 02/12/2015 for dizziness and chest pain.    SUBJECTIVE  Feeling well. No chest pain, sob or palpitations. No feeling.   CURRENT MEDS . atenolol  12.5 mg Oral BID  . enoxaparin (LOVENOX) injection  80 mg Subcutaneous Q12H  . FLUoxetine  20 mg Oral Daily  . gabapentin  300 mg Oral BID  . insulin aspart  0-9 Units Subcutaneous TID WC  . insulin glargine  25 Units Subcutaneous Daily  . levofloxacin (LEVAQUIN) IV  750 mg Intravenous Q48H  . pantoprazole  40 mg Oral Daily  . sodium chloride  3 mL Intravenous Q12H    OBJECTIVE  Filed Vitals:   02/12/15 1745 02/12/15 1835 02/12/15 2028 02/13/15 0537  BP: 145/71 248/86 132/76 158/60  Pulse: 72 69 81 67  Temp:  98.4 F (36.9 C) 98.3 F (36.8 C) 97.8 F (36.6 C)  TempSrc:  Oral Oral Oral  Resp: 15 16 18 18   Height:      Weight:    185 lb 1.6 oz (83.961 kg)  SpO2: 96% 97% 100% 95%    Intake/Output Summary (Last 24 hours) at 02/13/15 0744 Last data filed at 02/12/15 1441  Gross per 24 hour  Intake   2000 ml  Output      0 ml  Net   2000 ml   Filed Weights   02/12/15 1700 02/13/15 0537  Weight: 185 lb (83.915 kg) 185 lb 1.6 oz (83.961 kg)    PHYSICAL EXAM  General: Pleasant, NAD. Neuro: Alert and oriented X 3. Moves all extremities spontaneously. Psych: Normal affect. HEENT:  Normal  Neck: Supple without bruits or JVD. Lungs:  Resp regular and unlabored, CTA. Heart: RRR no s3, s4, or murmurs. Abdomen: Soft, non-tender, non-distended, BS + x 4.    Extremities: No clubbing, cyanosis or edema. DP/PT/Radials 2+ and equal bilaterally.  Accessory Clinical Findings  CBC  Recent Labs  02/12/15 1709 02/13/15 0428  WBC 8.2 6.9  HGB 12.8 11.6*  HCT 38.2 34.7*  MCV 86.4 87.2  PLT 323 671   Basic Metabolic Panel  Recent Labs  02/12/15 1125 02/12/15 1709 02/13/15 0428  NA 129*  --  134*  K 3.7  --  4.1  CL 85*  --  97*  CO2 27  --  27  GLUCOSE 260*  --  207*  BUN 26*  --  18  CREATININE 1.82* 1.33* 0.97  CALCIUM 8.8*  --  8.1*   Liver Function Tests  Recent Labs  02/12/15 1709 02/13/15 0428  AST 17 16  ALT 14 11*  ALKPHOS 80 75  BILITOT 0.3 0.5  PROT 7.1 6.2*  ALBUMIN 3.8 3.1*   No results for input(s): LIPASE, AMYLASE in the last 72 hours. Cardiac Enzymes  Recent Labs  02/12/15 1709 02/12/15 2315 02/13/15 0428  TROPONINI <0.03 <0.03 <0.03   D-Dimer  Recent Labs  02/12/15 1709  DDIMER 0.43   Thyroid Function Tests  Recent Labs  02/12/15 1709  TSH 0.704    TELE  NSR  Radiology/Studies  Dg Chest 2 View  02/12/2015   CLINICAL DATA:  Shortness of breath for 1 day  EXAM: CHEST  2 VIEW  COMPARISON:  April 12, 2014  FINDINGS: There is no edema or consolidation. The heart size and pulmonary vascularity are normal. No adenopathy. No bone lesions are appreciable.  IMPRESSION: No edema or consolidation.   Electronically Signed   By: Lowella Grip III M.D.   On: 02/12/2015 13:52    ASSESSMENT AND PLAN   1. Atypical Chest Pain - developed after feeling dizzy and nauseated.  - EKG non acute. Trop x 3 negative.  - Likely she can go home today, pending echocardiogram. -  F/u with Dr. Radford Pax and  will arrange for an outpatient nuclear stress test.  - TSH normal, pending A1c  2. Essential HTN - BP relatively stable.  - Continue Atenolol. Chlorthalidone on hold due to AKI, consider resuming as creatinine back to normal now.   3. AKI - likely secondary to viral gastroenteritis  - normal  after hydration.   4. DM - Pending A1c - per primary  Signed, Bhagat,Bhavinkumar PA-C   Patient seen, examined. Available data reviewed. Agree with findings, assessment, and plan as outlined by Robbie Lis, PA-C. The patient is independently interviewed and evaluated. I have reviewed her bedside echo images and they show normal LV function without pericardial effusion. Formal interpretation is currently pending. As per my consult note yesterday, I think the patient can be discharged home with outpatient stress testing. This will be arranged. Thank you for asking [place] to see her. If there are any further problems please on hesitate to call.  [name], M.D. 02/13/2015 1:10 PM

## 2015-02-13 NOTE — Progress Notes (Signed)
  Echocardiogram 2D Echocardiogram has been performed.  Judith [name] 02/13/2015, 12:51 PM

## 2015-02-13 NOTE — Discharge Summary (Signed)
Physician Discharge Summary  Judith [name] AYT:[id number] DOB: 1950-08-26 DOA: 02/12/2015  PCP: Ellsworth Lennox, MD  Admit date: 02/12/2015 Discharge date: 02/13/2015  Time spent: > 35 minutes  Recommendations for Outpatient Follow-up:  1. Follow up with Cardiology as scheduled for stress test 2. Follow up with Dr. Leward Quan in 1 month   Discharge Diagnoses:  Principal Problem:   Chest pain syndrome Active Problems:   Diabetes (HCC)   HTN (hypertension)   Diabetic neuropathy, painful (HCC)   Depression with anxiety   GERD (gastroesophageal reflux disease)   Chest pain   Acute kidney injury [facility])   Acute hyponatremia  Discharge Condition: stable  Diet recommendation: regular  Filed Weights   02/12/15 1700 02/13/15 0537  Weight: 83.915 kg (185 lb) 83.961 kg (185 lb 1.6 oz)   History of present illness:  64 year old female with a history of diabetes, morbid obesity, hypertension, gastroesophageal reflux disease, tobacco abuse, nephropathy, depression and anxiety presents to the ER with chest pain. Patient has not been feeling well since Monday with multiple episodes of vomiting but no diarrhea. The patient started developing dizziness this morning with ambulation and chest tightness. The patient had dental work done 3 days ago and has not smoked since Monday. She denies any fever but has had a nonproductive cough since the beginning of the week. She denies any hemoptysis. She has not had any syncopal episodes. She smokes about half a pack of day. She is complaining of dental pain today and is requesting pain medication Workup in the ER revealed creatinine of 1.82 which is gone up from 1.0 in June. Sodium 129. No history of hyponatremia Patient is being admitted for acute kidney injury chest pain rule out, COPD exacerbation and possible viral gastroenteritis  Hospital Course:   Chest pain - patient was admitted to the hospital with chest pain. Given risk factors cardiology was  consulted and recommended a 2D echo and outpatient stress test. Patient's chest pain has resolved, her cardiac enzymes have remained normal and her 2D echo was without concerning features (full report below). She is to have close follow up as an outpatient with cardiology for a stress test.  Acute kidney injury - likely secondary to viral gastroenteritis and intractable vomiting, her symptoms have resolved, able to tolerate a regular diet, her renal function has improved with hydration and is back to normal.  UTI - discharged home with Ciprofloxacin for 3 days.  Acute hyponatremia - due to dehydration, improved with fluids.  Vital gastroenteritis - self limiting, no further nausea/vomiting, tolerating diet well.  Diabetes ([place]) - continue home medications HTN (hypertension) - continue home medications/  Diabetic neuropathy, painful ([place]) - continue gabapentin Depression with anxiety - continue Prozac GERD (gastroesophageal reflux disease) - continue PPI Tobacco abuse - counseled for cessation  Procedures:  2D echo  Study Conclusions  - Left ventricle: The cavity size was normal. Wall thickness wasincreased in a pattern of moderate LVH. Systolic function wasnormal. The estimated ejection fraction was in the range of 60%to 65%. Wall motion was normal; there were no regional wallmotion abnormalities. Doppler parameters are consistent withabnormal left ventricular relaxation (grade 1 diastolicdysfunction). - Aortic valve: Valve area (VTI): 2.83 cm^2. Valve area (Vmax): 2.5cm^2. Valve area (Vmean): 2.94 cm^2. - Mitral valve: Calcified annulus.  Consultations:  Cardiology   Discharge Exam: Filed Vitals:   02/12/15 2028 02/13/15 0537 02/13/15 0936 02/13/15 1400  BP: 132/76 158/60 158/78 156/73  Pulse: 81 67 70 73  Temp: 98.3 F (36.8 C) 97.8 F (  36.6 C)  98.6 F (37 C)  TempSrc: Oral Oral  Oral  Resp: 18 18  18   Height:      Weight:  83.961 kg (185 lb 1.6 oz)    SpO2: 100% 95%   97%   General: NAD Cardiovascular: RRR Respiratory: CTA biL  Discharge Instructions     Medication List    TAKE these medications        atenolol-chlorthalidone 50-25 MG tablet  Commonly known as:  TENORETIC  TAKE ONE TABLET BY MOUTH ONCE DAILY.     buPROPion 150 MG 12 hr tablet  Commonly known as:  WELLBUTRIN SR  TAKE ONE TABLET BY MOUTH TWICE DAILY     ciprofloxacin 500 MG tablet  Commonly known as:  CIPRO  Take 1 tablet (500 mg total) by mouth 2 (two) times daily.     ciprofloxacin-dexamethasone otic suspension  Commonly known as:  CIPRODEX  Place 4 drops into the right ear 2 (two) times daily.     cyclobenzaprine 10 MG tablet  Commonly known as:  FLEXERIL  TAKE ONE TABLET BY MOUTH AT BEDTIME     FLUoxetine 20 MG capsule  Commonly known as:  PROZAC  TAKE ONE CAPSULE BY MOUTH ONCE DAILY     gabapentin 300 MG capsule  Commonly known as:  NEURONTIN  Three capsules at lunchtime; take three capsules at bedtime     Insulin Detemir 100 UNIT/ML Pen  Commonly known as:  LEVEMIR  Inject 20-25 Units into the skin daily at 10 pm.     Insulin Pen Needle 32G X 6 MM Misc  Use as directed     metFORMIN 1000 MG tablet  Commonly known as:  GLUCOPHAGE  TAKE ONE TABLET BY MOUTH TWICE DAILY WITH MEALS     omeprazole 20 MG capsule  Commonly known as:  PRILOSEC  Take 1 capsule (20 mg total) by mouth daily.     PROVENTIL HFA 108 (90 BASE) MCG/ACT inhaler  Generic drug:  albuterol  Inhale into the lungs every 6 (six) hours as needed for wheezing or shortness of breath.     traMADol 50 MG tablet  Commonly known as:  ULTRAM  Take 1 tablet by mouth in the afternoon and 2 tablets at bedtime.     triamcinolone cream 0.1 %  Commonly known as:  KENALOG  Apply 1 application topically 2 (two) times daily. May mix 1:1 with eurerin cream at home           Follow-up Information    Follow up with [facility]. Go on 02/21/2015.   Why:  outpatient stress test @ 7:15am  please go fasting   Contact information:   [address] [place] [address]       Follow up with Sueanne Margarita, MD. Go on 03/20/2015.   Specialty:  Cardiology   Why:  @3 :30    Contact information:   [address] [place] [address] [phone]       Follow up with [facility], MD. Schedule an appointment as soon as possible for a visit in 1 month.   Specialty:  Family Medicine     The results of significant diagnostics from this hospitalization (including imaging, microbiology, ancillary and laboratory) are listed below for reference.    Significant Diagnostic Studies: Dg Chest 2 View  02/12/2015   CLINICAL DATA:  Shortness of breath for 1 day  EXAM: CHEST  2 VIEW  COMPARISON:  April 12, 2014  FINDINGS: There is no edema or consolidation. The heart size and pulmonary vascularity are normal. No adenopathy. No bone lesions are appreciable.  IMPRESSION: No edema or consolidation.   Electronically Signed   By: Lowella Grip III M.D.   On: 02/12/2015 13:52   Labs: Basic Metabolic Panel:  Recent Labs Lab 02/12/15 1125 02/12/15 1709 02/13/15 0428  NA 129*  --  134*  K 3.7  --  4.1  CL 85*  --  97*  CO2 27  --  27  GLUCOSE 260*  --  207*  BUN 26*  --  18  CREATININE 1.82* 1.33* 0.97  CALCIUM 8.8*  --  8.1*   Liver Function Tests:  Recent Labs Lab 02/12/15 1709 02/13/15 0428  AST 17 16  ALT 14 11*  ALKPHOS 80 75  BILITOT 0.3 0.5  PROT 7.1 6.2*  ALBUMIN 3.8 3.1*   CBC:  Recent Labs Lab 02/12/15 1125 02/12/15 1709 02/13/15 0428  WBC 9.6 8.2 6.9  HGB 12.1 12.8 11.6*  HCT 36.1 38.2 34.7*  MCV 86.0 86.4 87.2  PLT 325 323 286   Cardiac Enzymes:  Recent Labs Lab 02/12/15 1709 02/12/15 2315 02/13/15 0428  TROPONINI <0.03 <0.03 <0.03   CBG:  Recent Labs Lab 02/12/15 1114 02/12/15 1730 02/12/15 2110 02/13/15 0755 02/13/15 1134  GLUCAP 266* 110* 273* 169* 155*    Signed:  Cicero Noy,  Katelan Hirt  [facility] 02/13/2015, 3:25 PM

## 2015-02-14 LAB — URINE CULTURE

## 2015-02-19 ENCOUNTER — Telehealth (HOSPITAL_COMMUNITY): Payer: Self-pay | Admitting: *Deleted

## 2015-02-19 NOTE — Telephone Encounter (Signed)
Patient given detailed instructions per Myocardial Perfusion Study Information Sheet for test on 02/21/15 at 715 Patient notified to arrive 15 minutes early and that it is imperative to arrive on time for appointment to keep from having the test rescheduled.  If you need to cancel or reschedule your appointment, please call the office within 24 hours of your appointment. Failure to do so may result in a cancellation of your appointment, and a $50 no show fee. Patient verbalized understanding. Hubbard Robinson, RN

## 2015-02-20 ENCOUNTER — Ambulatory Visit (INDEPENDENT_AMBULATORY_CARE_PROVIDER_SITE_OTHER): Payer: Commercial Managed Care - HMO | Admitting: Physician Assistant

## 2015-02-20 VITALS — BP 120/74 | HR 83 | Temp 98.7°F | Resp 18 | Ht 63.0 in | Wt 168.0 lb

## 2015-02-20 DIAGNOSIS — H6593 Unspecified nonsuppurative otitis media, bilateral: Secondary | ICD-10-CM

## 2015-02-20 MED ORDER — IPRATROPIUM BROMIDE 0.03 % NA SOLN
2.0000 | Freq: Two times a day (BID) | NASAL | Status: DC
Start: 2015-02-20 — End: 2015-05-30

## 2015-02-20 NOTE — Progress Notes (Signed)
   Subjective:    Patient ID: Judith [name], female    DOB: 1950-11-24, 64 y.o.   MRN: [id number]  HPI Patient presents for left ear pain that started this morning. Feels like ears are full and hears a whooshing sound in ear. Denies decreased hearing, tinnitus, congestion, rhinorrhea, HA, cough, or fever. Has taken sudafed this morning and relieved pressure, but wanted to make sure there was no infection.   Allergies  Allergen Reactions  . Codeine   . Iodine   . Neosporin [Neomycin-Bacitracin Zn-Polymyx]   . Penicillins   . Tape    Review of Systems As noted above.    Objective:   Physical Exam  Constitutional: She is oriented to person, place, and time. She appears well-developed and well-nourished. No distress.  Blood pressure 120/74, pulse 83, temperature 98.7 F (37.1 C), temperature source Oral, resp. rate 18, height 5\' 3"  (1.6 m), weight 168 lb (76.204 kg), SpO2 98 %.  HENT:  Head: Normocephalic and atraumatic.  Right Ear: External ear and ear canal normal. No drainage, swelling or tenderness. No mastoid tenderness. Tympanic membrane is not injected, not scarred, not perforated, not erythematous, not retracted and not bulging. A middle ear effusion (serous fluid) is present.  Left Ear: External ear and ear canal normal. No drainage, swelling or tenderness. No mastoid tenderness. Tympanic membrane is not injected, not scarred, not perforated, not erythematous, not retracted and not bulging. A middle ear effusion (serous fluid) is present.  Nose: Nose normal.  Mouth/Throat: Uvula is midline, oropharynx is clear and moist and mucous membranes are normal. No oropharyngeal exudate.  Eyes: Conjunctivae are normal. Pupils are equal, round, and reactive to light. Right eye exhibits no discharge. Left eye exhibits no discharge. No scleral icterus.  Neck: Neck supple.  Pulmonary/Chest: Effort normal.  Lymphadenopathy:    She has no cervical adenopathy.  Neurological: She is alert and  oriented to person, place, and time.  Skin: Skin is warm and dry. No rash noted. She is not diaphoretic. No erythema. No pallor.  Psychiatric: She has a normal mood and affect. Her behavior is normal. Judgment and thought content normal.       Assessment & Plan:  1. Middle ear effusion, bilateral Continue Sudafed. Intake plenty of fluids.  - ipratropium (ATROVENT) 0.03 % nasal spray; Place 2 sprays into both nostrils 2 (two) times daily.  Dispense: 30 mL; Refill: 0   Brysyn Brandenberger PA-C  [facility] 02/20/2015 1:15 PM

## 2015-02-21 ENCOUNTER — Ambulatory Visit (HOSPITAL_COMMUNITY): Payer: Commercial Managed Care - HMO | Attending: Internal Medicine

## 2015-02-21 DIAGNOSIS — R9439 Abnormal result of other cardiovascular function study: Secondary | ICD-10-CM | POA: Insufficient documentation

## 2015-02-21 DIAGNOSIS — I10 Essential (primary) hypertension: Secondary | ICD-10-CM | POA: Insufficient documentation

## 2015-02-21 DIAGNOSIS — F17200 Nicotine dependence, unspecified, uncomplicated: Secondary | ICD-10-CM | POA: Diagnosis not present

## 2015-02-21 DIAGNOSIS — E119 Type 2 diabetes mellitus without complications: Secondary | ICD-10-CM | POA: Diagnosis not present

## 2015-02-21 DIAGNOSIS — R079 Chest pain, unspecified: Secondary | ICD-10-CM | POA: Insufficient documentation

## 2015-02-21 LAB — MYOCARDIAL PERFUSION IMAGING
CHL CUP NUCLEAR SRS: 1
CHL CUP NUCLEAR SSS: 7
CHL CUP RESTING HR STRESS: 75 {beats}/min
LHR: 0.27
LV sys vol: 34 mL
LVDIAVOL: 87 mL
Peak HR: 89 {beats}/min
SDS: 6
TID: 0.81

## 2015-02-21 MED ORDER — REGADENOSON 0.4 MG/5ML IV SOLN
0.4000 mg | Freq: Once | INTRAVENOUS | Status: AC
  Administered 2015-02-21: 0.4 mg via INTRAVENOUS

## 2015-02-21 MED ORDER — TECHNETIUM TC 99M SESTAMIBI GENERIC - CARDIOLITE
32.7000 | Freq: Once | INTRAVENOUS | Status: AC | PRN
  Administered 2015-02-21: 32.7 via INTRAVENOUS

## 2015-02-21 MED ORDER — TECHNETIUM TC 99M SESTAMIBI GENERIC - CARDIOLITE
10.1000 | Freq: Once | INTRAVENOUS | Status: AC | PRN
  Administered 2015-02-21: 10.1 via INTRAVENOUS

## 2015-02-24 NOTE — Progress Notes (Signed)
Cardiology Office Note   Date:  02/25/2015   ID:  Judith [name], DOB [birth year], MRN [id number]  PCP:  Ellsworth Lennox, MD  Cardiologist:  Dr. Fransico Him   Electrophysiologist:  n/a   Chief Complaint  Patient presents with  . Hospitalization Follow-up    admx with chest pain  . Follow-up    abnormal stress test     History of Present Illness: Judith [name] is a 64 y.o. female with a hx of diabetes, HTN, tobacco abuse.  She was evaluated by Dr. Radford Pax in 4/15 for chest pain.  Echo demonstrated normal LVF.  Nuclear stress test was cancelled when her symptoms improved with PPI therapy.  Last seen by Dr. Fransico Him 4/15.  Admitted 10/5-10/6 with chest pain in the setting of UTI and viral gastroenteritis complicated by AKI and hypoNa.  She was evaluated by Cardiology.  CEs remained neg.  Echo demonstrated mod LVH, EF 60-65%, no RWMA, Gr 1 DD, MAC.  OP stress test was recommended.  This was done 02/21/15 and demonstrated EF 60% with inf-lat and apical lateral ischemia (intermediate risk).  She returns for FU.    She does note a history of exertional chest tightness. This is usually with more extreme activities. She denies any chest discomfort at rest or with minimal activities. She denies significant dyspnea. She denies orthopnea, PND or edema. She denies syncope or near syncope.   Studies/Reports Reviewed Today:  Myoview 02/21/15 EF 60%, inferolateral and apical lateral ischemia, intermediate risk  Echo 02/13/15 Moderate LVH, EF 60-65%, normal wall motion, grade 1 diastolic dysfunction, MAC    Past Medical History  Diagnosis Date  . Hypertension   . Goiter   . Chronic kidney disease   . Allergy   . Arthritis   . Neuropathy ([place])   . History of shingles 06/01/2013  . Abnormal EKG 07/31/2013  . Diabetes mellitus without complication (HCC)     insulin dependent  . GERD (gastroesophageal reflux disease)   . Carpal tunnel syndrome, bilateral     Past Surgical History    Procedure Laterality Date  . Tonsillectomy    . Cesarean section    . Abdominal hysterectomy    . Carpal tunnel release Right Nov 2015  . Right knee    . Right shoulder    . Foot neuroma surgery    . Carpel tunnel syndrome Left Dec 2015  . Carpel tunnel release Right 1992    [name]  . Thyroid surgery       Current Outpatient Prescriptions  Medication Sig Dispense Refill  . albuterol (PROVENTIL HFA) 108 (90 BASE) MCG/ACT inhaler Inhale into the lungs every 6 (six) hours as needed for wheezing or shortness of breath.    Marland Kitchen atenolol-chlorthalidone (TENORETIC) 50-25 MG per tablet TAKE ONE TABLET BY MOUTH ONCE DAILY. 30 tablet 5  . buPROPion (WELLBUTRIN SR) 150 MG 12 hr tablet TAKE ONE TABLET BY MOUTH TWICE DAILY 60 tablet 5  . ciprofloxacin-dexamethasone (CIPRODEX) otic suspension Place 4 drops into the right ear 2 (two) times daily. (Patient not taking: Reported on 02/12/2015) 7.5 mL 0  . cyclobenzaprine (FLEXERIL) 10 MG tablet TAKE ONE TABLET BY MOUTH AT BEDTIME 30 tablet 1  . FLUoxetine (PROZAC) 20 MG capsule TAKE ONE CAPSULE BY MOUTH ONCE DAILY 30 capsule 5  . gabapentin (NEURONTIN) 300 MG capsule Three capsules at lunchtime; take three capsules at bedtime 180 capsule 5  . Insulin Detemir (LEVEMIR) 100 UNIT/ML Pen Inject 20-25 Units into the skin daily at  10 pm. 15 mL 11  . Insulin Pen Needle 32G X 6 MM MISC Use as directed 50 each 5  . ipratropium (ATROVENT) 0.03 % nasal spray Place 2 sprays into both nostrils 2 (two) times daily. 30 mL 0  . metFORMIN (GLUCOPHAGE) 1000 MG tablet TAKE ONE TABLET BY MOUTH TWICE DAILY WITH MEALS 60 tablet 5  . omeprazole (PRILOSEC) 20 MG capsule Take 1 capsule (20 mg total) by mouth daily. 30 capsule 5  . traMADol (ULTRAM) 50 MG tablet Take 1 tablet by mouth in the afternoon and 2 tablets at bedtime. 90 tablet 5  . triamcinolone cream (KENALOG) 0.1 % Apply 1 application topically 2 (two) times daily. May mix 1:1 with eurerin cream at home 454 g 0   No  current facility-administered medications for this visit.    Allergies:   Codeine; Iodine; Neosporin; Penicillins; and Tape    Social History:  The patient  reports that she has been smoking Cigarettes.  She has a 20.5 pack-year smoking history. She has never used smokeless tobacco. She reports that she does not drink alcohol or use illicit drugs.   Family History:  The patient's family history includes Allergies in her sister; Breast cancer in her maternal grandmother; COPD in her father; Cancer in her mother; Emphysema in her maternal grandfather and paternal grandfather; Heart attack in her father; Heart disease in her father; Hypertension in her father; Leukemia in her paternal grandmother.    ROS:   Please see the history of present illness.   Review of Systems  All other systems reviewed and are negative.     PHYSICAL EXAM: VS:  There were no vitals taken for this visit.    Wt Readings from Last 3 Encounters:  02/21/15 168 lb (76.204 kg)  02/20/15 168 lb (76.204 kg)  02/13/15 185 lb 1.6 oz (83.961 kg)     GEN: Well nourished, well developed, in no acute distress HEENT: normal Neck: no JVD, no carotid bruits, no masses Cardiac:  Normal A6/T0, RRR; 1/6 systolic murmur RUSB,  no rubs or gallops, no edema   Respiratory:  clear to auscultation bilaterally, no wheezing, rhonchi or rales. GI: soft, nontender, nondistended, + BS MS: no deformity or atrophy Skin: warm and dry  Neuro:  CNs II-XII intact, Strength and sensation are intact Psych: Normal affect   EKG:  EKG is ordered today.  It demonstrates:   NSR, HR 71, normal axis, QTC 432 ms   Recent Labs: 02/12/2015: TSH 0.704 02/13/2015: ALT 11*; BUN 18; Creatinine, Ser 0.97; Hemoglobin 11.6*; Platelets 286; Potassium 4.1; Sodium 134*    Lipid Panel    Component Value Date/Time   CHOL 236* 12/06/2014 1415   TRIG 993* 12/06/2014 1415   HDL 29* 12/06/2014 1415   CHOLHDL 8.1* 12/06/2014 1415   VLDL NOT CALC 12/06/2014  1415   LDLCALC NOT CALC 12/06/2014 1415      ASSESSMENT AND PLAN:  1. Chest Pain:  Recent admission with chest pain in the setting of UTI and gastroenteritis c/b AKI and hyponatremia.  CEs were normal and her Echo demonstrated normal LVF.  However, her Nuclear stress test is abnormal with the suggestion of ischemia in the LCx territory.  She does admit to chest discomfort with moderate to severe exertion. She has multiple risk factors for CAD including HTN, DM2, smoking hx, FHx of CAD.  Risks and benefits of cardiac catheterization have been discussed with the patient.  These include bleeding, infection, kidney damage, stroke, heart attack,  death.  The patient understands these risks and is willing to proceed.  Start ASA 81 mg QD. Will give prn NTG as well.  2. HTN:  Controlled.   3. DM2:  Hold Metformin 24 hours before and 48 hours after.  She can take 1/2 dose of Levemir the night before her cath.  4. Tobacco Abuse:  She has quit smoking.  5. Iodine Allergy:  Will give Prednisone, Benadryl, Pepcid before cath. She will take prednisone 60 mg 18 hours, 12 hours and one hour prior cardiac catheterization.  6. Hyperlipidemia:  Trigs in 7/16 were over 900.  Will arrange repeat FLP. If + CAD on cath, she will need statin Rx initiated.      Medication Changes: Current medicines are reviewed at length with the patient today.  Concerns regarding medicines are as outlined above.  The following changes have been made:   Discontinued Medications   No medications on file   Modified Medications   No medications on file   New Prescriptions   No medications on file   Labs/ tests ordered today include:   No orders of the defined types were placed in this encounter.      Disposition:    FU with Dr. Fransico Him 2 weeks post cath.     Signed, Versie Starks, MHS 02/25/2015 8:07 AM    [facility] [place], [place], [place]  [address] Phone: [phone];  Fax: [phone]

## 2015-02-25 ENCOUNTER — Ambulatory Visit (INDEPENDENT_AMBULATORY_CARE_PROVIDER_SITE_OTHER): Payer: Commercial Managed Care - HMO | Admitting: Physician Assistant

## 2015-02-25 ENCOUNTER — Encounter: Payer: Self-pay | Admitting: Physician Assistant

## 2015-02-25 ENCOUNTER — Encounter: Payer: Self-pay | Admitting: *Deleted

## 2015-02-25 VITALS — BP 138/78 | HR 71 | Ht 64.0 in | Wt 168.0 lb

## 2015-02-25 DIAGNOSIS — Z794 Long term (current) use of insulin: Secondary | ICD-10-CM

## 2015-02-25 DIAGNOSIS — E785 Hyperlipidemia, unspecified: Secondary | ICD-10-CM | POA: Diagnosis not present

## 2015-02-25 DIAGNOSIS — R931 Abnormal findings on diagnostic imaging of heart and coronary circulation: Secondary | ICD-10-CM

## 2015-02-25 DIAGNOSIS — R9439 Abnormal result of other cardiovascular function study: Secondary | ICD-10-CM

## 2015-02-25 DIAGNOSIS — E119 Type 2 diabetes mellitus without complications: Secondary | ICD-10-CM | POA: Diagnosis not present

## 2015-02-25 DIAGNOSIS — R079 Chest pain, unspecified: Secondary | ICD-10-CM

## 2015-02-25 DIAGNOSIS — Z720 Tobacco use: Secondary | ICD-10-CM

## 2015-02-25 LAB — BASIC METABOLIC PANEL
BUN: 19 mg/dL (ref 7–25)
CO2: 26 mmol/L (ref 20–31)
Calcium: 9 mg/dL (ref 8.6–10.4)
Chloride: 98 mmol/L (ref 98–110)
Creat: 0.73 mg/dL (ref 0.50–0.99)
GLUCOSE: 183 mg/dL — AB (ref 65–99)
POTASSIUM: 4.1 mmol/L (ref 3.5–5.3)
SODIUM: 134 mmol/L — AB (ref 135–146)

## 2015-02-25 LAB — CBC WITH DIFFERENTIAL/PLATELET
BASOS ABS: 0.1 10*3/uL (ref 0.0–0.1)
Basophils Relative: 1 % (ref 0–1)
Eosinophils Absolute: 0.4 10*3/uL (ref 0.0–0.7)
Eosinophils Relative: 5 % (ref 0–5)
HEMATOCRIT: 36.1 % (ref 36.0–46.0)
HEMOGLOBIN: 12.2 g/dL (ref 12.0–15.0)
LYMPHS PCT: 24 % (ref 12–46)
Lymphs Abs: 2.1 10*3/uL (ref 0.7–4.0)
MCH: 29 pg (ref 26.0–34.0)
MCHC: 33.8 g/dL (ref 30.0–36.0)
MCV: 85.7 fL (ref 78.0–100.0)
MONO ABS: 0.5 10*3/uL (ref 0.1–1.0)
MPV: 8.8 fL (ref 8.6–12.4)
Monocytes Relative: 6 % (ref 3–12)
NEUTROS ABS: 5.6 10*3/uL (ref 1.7–7.7)
NEUTROS PCT: 64 % (ref 43–77)
Platelets: 345 10*3/uL (ref 150–400)
RBC: 4.21 MIL/uL (ref 3.87–5.11)
RDW: 13.6 % (ref 11.5–15.5)
WBC: 8.7 10*3/uL (ref 4.0–10.5)

## 2015-02-25 MED ORDER — PREDNISONE 20 MG PO TABS: 20.0000 mg | ORAL_TABLET | ORAL | Status: DC

## 2015-02-25 MED ORDER — ASPIRIN EC 81 MG PO TBEC: 81.0000 mg | DELAYED_RELEASE_TABLET | Freq: Every day | ORAL | Status: DC

## 2015-02-25 MED ORDER — NITROGLYCERIN 0.4 MG SL SUBL: 0.4000 mg | SUBLINGUAL_TABLET | SUBLINGUAL | Status: DC | PRN

## 2015-02-25 NOTE — Patient Instructions (Signed)
Medication Instructions:  1. START ASA 81 MG DAILY 2. AN RX FOR NTG HAS BEEN SENT IN AND YOU HAVE BEEN ADVISED AS TO HOW AND WHEN TO USE NTG  3. AN RX FOR PREDNISONE HAS BEEN SENT IN FOR PRE CATH; DIRECTIONS TAKE 60 MG AT 6 PM THE NIGHT BEFORE CATH, 60 MG AGAIN AT 12 AM, THEN AGAIN 60 MG AT 6 AM THE MORNING OF CATH  Labwork: 1. TODAY BMET, CBC W.DIFF,PT/INR  2. FASTING LIPID AND LIVER PANEL AT YOUR CONVINCE   Testing/Procedures: Your physician has requested that you have a cardiac catheterization. Cardiac catheterization is used to diagnose and/or treat various heart conditions. Doctors may recommend this procedure for a number of different reasons. The most common reason is to evaluate chest pain. Chest pain can be a symptom of coronary artery disease (CAD), and cardiac catheterization can show whether plaque is narrowing or blocking your heart's arteries. This procedure is also used to evaluate the valves, as well as measure the blood flow and oxygen levels in different parts of your heart. For further information please visit [email or web address]. Please follow instruction sheet, as given.   Follow-Up: 03/13/15 @ 10:10 WITH SCOTT WEAVER, PAC POST CATH FOLLOW UP  Any Other Special Instructions Will Be Listed Below (If Applicable).

## 2015-02-26 ENCOUNTER — Telehealth: Payer: Self-pay | Admitting: *Deleted

## 2015-02-26 LAB — PROTIME-INR
INR: 0.98 (ref <1.50)
Prothrombin Time: 13.1 seconds (ref 11.6–15.2)

## 2015-02-26 NOTE — Telephone Encounter (Signed)
Lmptcb for lab results 

## 2015-02-27 ENCOUNTER — Encounter (HOSPITAL_COMMUNITY)
Admission: RE | Disposition: A | Discharge status: Home / Self Care | Payer: Self-pay | Source: Ambulatory Visit | Attending: Cardiovascular Disease

## 2015-02-27 ENCOUNTER — Ambulatory Visit (HOSPITAL_COMMUNITY)
Admission: RE | Admit: 2015-02-27 | Discharge: 2015-02-27 | Disposition: A | Discharge status: Home / Self Care | Payer: Commercial Managed Care - HMO | Source: Ambulatory Visit | Attending: Cardiovascular Disease | Admitting: Cardiovascular Disease

## 2015-02-27 ENCOUNTER — Ambulatory Visit (INDEPENDENT_AMBULATORY_CARE_PROVIDER_SITE_OTHER): Payer: Commercial Managed Care - HMO | Admitting: Family Medicine

## 2015-02-27 VITALS — BP 152/64 | HR 77 | Temp 98.2°F | Resp 18 | Ht 64.0 in | Wt 166.0 lb

## 2015-02-27 DIAGNOSIS — R079 Chest pain, unspecified: Secondary | ICD-10-CM | POA: Diagnosis not present

## 2015-02-27 DIAGNOSIS — E1122 Type 2 diabetes mellitus with diabetic chronic kidney disease: Secondary | ICD-10-CM | POA: Insufficient documentation

## 2015-02-27 DIAGNOSIS — K219 Gastro-esophageal reflux disease without esophagitis: Secondary | ICD-10-CM | POA: Diagnosis not present

## 2015-02-27 DIAGNOSIS — Z7984 Long term (current) use of oral hypoglycemic drugs: Secondary | ICD-10-CM | POA: Insufficient documentation

## 2015-02-27 DIAGNOSIS — R9439 Abnormal result of other cardiovascular function study: Secondary | ICD-10-CM | POA: Diagnosis present

## 2015-02-27 DIAGNOSIS — F17210 Nicotine dependence, cigarettes, uncomplicated: Secondary | ICD-10-CM | POA: Diagnosis not present

## 2015-02-27 DIAGNOSIS — I2582 Chronic total occlusion of coronary artery: Secondary | ICD-10-CM | POA: Insufficient documentation

## 2015-02-27 DIAGNOSIS — E1140 Type 2 diabetes mellitus with diabetic neuropathy, unspecified: Secondary | ICD-10-CM | POA: Diagnosis not present

## 2015-02-27 DIAGNOSIS — E119 Type 2 diabetes mellitus without complications: Secondary | ICD-10-CM | POA: Diagnosis not present

## 2015-02-27 DIAGNOSIS — Z8249 Family history of ischemic heart disease and other diseases of the circulatory system: Secondary | ICD-10-CM | POA: Insufficient documentation

## 2015-02-27 DIAGNOSIS — I25119 Atherosclerotic heart disease of native coronary artery with unspecified angina pectoris: Secondary | ICD-10-CM

## 2015-02-27 DIAGNOSIS — Z794 Long term (current) use of insulin: Secondary | ICD-10-CM

## 2015-02-27 DIAGNOSIS — Z91041 Radiographic dye allergy status: Secondary | ICD-10-CM | POA: Insufficient documentation

## 2015-02-27 DIAGNOSIS — E785 Hyperlipidemia, unspecified: Secondary | ICD-10-CM | POA: Insufficient documentation

## 2015-02-27 DIAGNOSIS — R739 Hyperglycemia, unspecified: Secondary | ICD-10-CM | POA: Diagnosis not present

## 2015-02-27 DIAGNOSIS — N189 Chronic kidney disease, unspecified: Secondary | ICD-10-CM | POA: Diagnosis not present

## 2015-02-27 DIAGNOSIS — I2510 Atherosclerotic heart disease of native coronary artery without angina pectoris: Secondary | ICD-10-CM | POA: Insufficient documentation

## 2015-02-27 DIAGNOSIS — I129 Hypertensive chronic kidney disease with stage 1 through stage 4 chronic kidney disease, or unspecified chronic kidney disease: Secondary | ICD-10-CM | POA: Diagnosis not present

## 2015-02-27 HISTORY — PX: CARDIAC CATHETERIZATION: SHX172

## 2015-02-27 LAB — GLUCOSE, CAPILLARY
Glucose-Capillary: 386 mg/dL — ABNORMAL HIGH (ref 65–99)
Glucose-Capillary: 343 mg/dL — ABNORMAL HIGH (ref 65–99)
Glucose-Capillary: 316 mg/dL — ABNORMAL HIGH (ref 65–99)

## 2015-02-27 LAB — GLUCOSE, POCT (MANUAL RESULT ENTRY): POC GLUCOSE: 439 mg/dL — AB (ref 70–99)

## 2015-02-27 SURGERY — LEFT HEART CATH AND CORONARY ANGIOGRAPHY

## 2015-02-27 MED ORDER — HEPARIN SODIUM (PORCINE) 1000 UNIT/ML IJ SOLN
INTRAMUSCULAR | Status: DC | PRN
  Administered 2015-02-27: 4000 [IU] via INTRAVENOUS

## 2015-02-27 MED ORDER — ACETAMINOPHEN 325 MG PO TABS
ORAL_TABLET | ORAL | Status: AC
  Filled 2015-02-27: qty 2

## 2015-02-27 MED ORDER — VERAPAMIL HCL 2.5 MG/ML IV SOLN
INTRAVENOUS | Status: AC
  Filled 2015-02-27: qty 2

## 2015-02-27 MED ORDER — SODIUM CHLORIDE 0.9 % IJ SOLN: 3.0000 mL | Freq: Two times a day (BID) | INTRAMUSCULAR | Status: DC

## 2015-02-27 MED ORDER — INSULIN ASPART 100 UNIT/ML ~~LOC~~ SOLN
SUBCUTANEOUS | Status: AC
  Filled 2015-02-27: qty 1

## 2015-02-27 MED ORDER — FAMOTIDINE IN NACL 20-0.9 MG/50ML-% IV SOLN
20.0000 mg | INTRAVENOUS | Status: AC
  Administered 2015-02-27: 20 mg via INTRAVENOUS

## 2015-02-27 MED ORDER — SODIUM CHLORIDE 0.9 % IJ SOLN: 3.0000 mL | INTRAMUSCULAR | Status: DC | PRN

## 2015-02-27 MED ORDER — DIPHENHYDRAMINE HCL 50 MG/ML IJ SOLN
INTRAMUSCULAR | Status: AC
  Administered 2015-02-27: 25 mg via INTRAVENOUS
  Filled 2015-02-27: qty 1

## 2015-02-27 MED ORDER — SODIUM CHLORIDE 0.9 % IV SOLN: 250.0000 mL | INTRAVENOUS | Status: DC | PRN

## 2015-02-27 MED ORDER — SODIUM CHLORIDE 0.9 % IV SOLN
INTRAVENOUS | Status: DC
  Administered 2015-02-27: 11:00:00 via INTRAVENOUS

## 2015-02-27 MED ORDER — ASPIRIN 81 MG PO CHEW: 81.0000 mg | CHEWABLE_TABLET | ORAL | Status: DC

## 2015-02-27 MED ORDER — INSULIN LISPRO 100 UNIT/ML ~~LOC~~ SOLN
5.0000 [IU] | Freq: Three times a day (TID) | SUBCUTANEOUS | Status: DC
Start: 2015-02-27 — End: 2015-05-30

## 2015-02-27 MED ORDER — ACETAMINOPHEN 325 MG PO TABS
650.0000 mg | ORAL_TABLET | ORAL | Status: DC | PRN
  Administered 2015-02-27: 650 mg via ORAL

## 2015-02-27 MED ORDER — MIDAZOLAM HCL 2 MG/2ML IJ SOLN
INTRAMUSCULAR | Status: AC
  Filled 2015-02-27: qty 4

## 2015-02-27 MED ORDER — VERAPAMIL HCL 2.5 MG/ML IV SOLN
INTRAVENOUS | Status: DC | PRN
  Administered 2015-02-27: 14:00:00 via INTRA_ARTERIAL

## 2015-02-27 MED ORDER — CLOPIDOGREL BISULFATE 75 MG PO TABS: 75.0000 mg | ORAL_TABLET | Freq: Every day | ORAL | Status: DC

## 2015-02-27 MED ORDER — DIPHENHYDRAMINE HCL 50 MG/ML IJ SOLN
25.0000 mg | INTRAMUSCULAR | Status: AC
  Administered 2015-02-27: 25 mg via INTRAVENOUS

## 2015-02-27 MED ORDER — FENTANYL CITRATE (PF) 100 MCG/2ML IJ SOLN
INTRAMUSCULAR | Status: AC
  Filled 2015-02-27: qty 4

## 2015-02-27 MED ORDER — HEPARIN SODIUM (PORCINE) 1000 UNIT/ML IJ SOLN
INTRAMUSCULAR | Status: AC
  Filled 2015-02-27: qty 1

## 2015-02-27 MED ORDER — MIDAZOLAM HCL 2 MG/2ML IJ SOLN
INTRAMUSCULAR | Status: DC | PRN
  Administered 2015-02-27: 2 mg via INTRAVENOUS

## 2015-02-27 MED ORDER — FENTANYL CITRATE (PF) 100 MCG/2ML IJ SOLN
INTRAMUSCULAR | Status: DC | PRN
  Administered 2015-02-27: 50 ug via INTRAVENOUS

## 2015-02-27 MED ORDER — IOHEXOL 350 MG/ML SOLN
INTRAVENOUS | Status: DC | PRN
  Administered 2015-02-27: 80 mL via INTRACARDIAC

## 2015-02-27 MED ORDER — ONDANSETRON HCL 4 MG/2ML IJ SOLN: 4.0000 mg | Freq: Four times a day (QID) | INTRAMUSCULAR | Status: DC | PRN

## 2015-02-27 MED ORDER — LIDOCAINE HCL (PF) 1 % IJ SOLN
INTRAMUSCULAR | Status: DC | PRN
  Administered 2015-02-27: 14:00:00

## 2015-02-27 MED ORDER — SODIUM CHLORIDE 0.9 % IV SOLN: INTRAVENOUS | Status: DC

## 2015-02-27 MED ORDER — FAMOTIDINE IN NACL 20-0.9 MG/50ML-% IV SOLN
INTRAVENOUS | Status: AC
  Administered 2015-02-27: 20 mg via INTRAVENOUS
  Filled 2015-02-27: qty 50

## 2015-02-27 SURGICAL SUPPLY — 12 items
CATH INFINITI 5 FR JL3.5 (CATHETERS) ×3 IMPLANT
CATH INFINITI 5FR AL1 (CATHETERS) ×3 IMPLANT
CATH INFINITI 5FR ANG PIGTAIL (CATHETERS) ×3 IMPLANT
CATH INFINITI JR4 5F (CATHETERS) ×3 IMPLANT
DEVICE RAD COMP TR BAND LRG (VASCULAR PRODUCTS) ×3 IMPLANT
GLIDESHEATH SLEND SS 6F .021 (SHEATH) ×3 IMPLANT
KIT HEART LEFT (KITS) ×3 IMPLANT
PACK CARDIAC CATHETERIZATION (CUSTOM PROCEDURE TRAY) ×3 IMPLANT
SYR MEDRAD MARK V 150ML (SYRINGE) ×3 IMPLANT
TRANSDUCER W/STOPCOCK (MISCELLANEOUS) ×3 IMPLANT
TUBING CIL FLEX 10 FLL-RA (TUBING) ×3 IMPLANT
WIRE SAFE-T 1.5MM-J .035X260CM (WIRE) ×3 IMPLANT

## 2015-02-27 NOTE — H&P (View-Only) (Signed)
Cardiology Office Note   Date:  02/25/2015   ID:  Judith [name], DOB 09-May-1951, MRN [id number]  PCP:  Ellsworth Lennox, MD  Cardiologist:  Dr. Fransico Him   Electrophysiologist:  n/a   Chief Complaint  Patient presents with  . Hospitalization Follow-up    admx with chest pain  . Follow-up    abnormal stress test     History of Present Illness: Judith [name] is a 64 y.o. female with a hx of diabetes, HTN, tobacco abuse.  She was evaluated by Dr. Radford Pax in 4/15 for chest pain.  Echo demonstrated normal LVF.  Nuclear stress test was cancelled when her symptoms improved with PPI therapy.  Last seen by Dr. Fransico Him 4/15.  Admitted 10/5-10/6 with chest pain in the setting of UTI and viral gastroenteritis complicated by AKI and hypoNa.  She was evaluated by Cardiology.  CEs remained neg.  Echo demonstrated mod LVH, EF 60-65%, no RWMA, Gr 1 DD, MAC.  OP stress test was recommended.  This was done 02/21/15 and demonstrated EF 60% with inf-lat and apical lateral ischemia (intermediate risk).  She returns for FU.    She does note a history of exertional chest tightness. This is usually with more extreme activities. She denies any chest discomfort at rest or with minimal activities. She denies significant dyspnea. She denies orthopnea, PND or edema. She denies syncope or near syncope.   Studies/Reports Reviewed Today:  Myoview 02/21/15 EF 60%, inferolateral and apical lateral ischemia, intermediate risk  Echo 02/13/15 Moderate LVH, EF 60-65%, normal wall motion, grade 1 diastolic dysfunction, MAC    Past Medical History  Diagnosis Date  . Hypertension   . Goiter   . Chronic kidney disease   . Allergy   . Arthritis   . Neuropathy ([place])   . History of shingles 06/01/2013  . Abnormal EKG 07/31/2013  . Diabetes mellitus without complication (HCC)     insulin dependent  . GERD (gastroesophageal reflux disease)   . Carpal tunnel syndrome, bilateral     Past Surgical History    Procedure Laterality Date  . Tonsillectomy    . Cesarean section    . Abdominal hysterectomy    . Carpal tunnel release Right Nov 2015  . Right knee    . Right shoulder    . Foot neuroma surgery    . Carpel tunnel syndrome Left Dec 2015  . Carpel tunnel release Right 1992    [name]  . Thyroid surgery       Current Outpatient Prescriptions  Medication Sig Dispense Refill  . albuterol (PROVENTIL HFA) 108 (90 BASE) MCG/ACT inhaler Inhale into the lungs every 6 (six) hours as needed for wheezing or shortness of breath.    Marland Kitchen atenolol-chlorthalidone (TENORETIC) 50-25 MG per tablet TAKE ONE TABLET BY MOUTH ONCE DAILY. 30 tablet 5  . buPROPion (WELLBUTRIN SR) 150 MG 12 hr tablet TAKE ONE TABLET BY MOUTH TWICE DAILY 60 tablet 5  . ciprofloxacin-dexamethasone (CIPRODEX) otic suspension Place 4 drops into the right ear 2 (two) times daily. (Patient not taking: Reported on 02/12/2015) 7.5 mL 0  . cyclobenzaprine (FLEXERIL) 10 MG tablet TAKE ONE TABLET BY MOUTH AT BEDTIME 30 tablet 1  . FLUoxetine (PROZAC) 20 MG capsule TAKE ONE CAPSULE BY MOUTH ONCE DAILY 30 capsule 5  . gabapentin (NEURONTIN) 300 MG capsule Three capsules at lunchtime; take three capsules at bedtime 180 capsule 5  . Insulin Detemir (LEVEMIR) 100 UNIT/ML Pen Inject 20-25 Units into the skin daily at  10 pm. 15 mL 11  . Insulin Pen Needle 32G X 6 MM MISC Use as directed 50 each 5  . ipratropium (ATROVENT) 0.03 % nasal spray Place 2 sprays into both nostrils 2 (two) times daily. 30 mL 0  . metFORMIN (GLUCOPHAGE) 1000 MG tablet TAKE ONE TABLET BY MOUTH TWICE DAILY WITH MEALS 60 tablet 5  . omeprazole (PRILOSEC) 20 MG capsule Take 1 capsule (20 mg total) by mouth daily. 30 capsule 5  . traMADol (ULTRAM) 50 MG tablet Take 1 tablet by mouth in the afternoon and 2 tablets at bedtime. 90 tablet 5  . triamcinolone cream (KENALOG) 0.1 % Apply 1 application topically 2 (two) times daily. May mix 1:1 with eurerin cream at home 454 g 0   No  current facility-administered medications for this visit.    Allergies:   Codeine; Iodine; Neosporin; Penicillins; and Tape    Social History:  The patient  reports that she has been smoking Cigarettes.  She has a 20.5 pack-year smoking history. She has never used smokeless tobacco. She reports that she does not drink alcohol or use illicit drugs.   Family History:  The patient's family history includes Allergies in her sister; Breast cancer in her maternal grandmother; COPD in her father; Cancer in her mother; Emphysema in her maternal grandfather and paternal grandfather; Heart attack in her father; Heart disease in her father; Hypertension in her father; Leukemia in her paternal grandmother.    ROS:   Please see the history of present illness.   Review of Systems  All other systems reviewed and are negative.     PHYSICAL EXAM: VS:  There were no vitals taken for this visit.    Wt Readings from Last 3 Encounters:  02/21/15 168 lb (76.204 kg)  02/20/15 168 lb (76.204 kg)  02/13/15 185 lb 1.6 oz (83.961 kg)     GEN: Well nourished, well developed, in no acute distress HEENT: normal Neck: no JVD, no carotid bruits, no masses Cardiac:  Normal D5/H2, RRR; 1/6 systolic murmur RUSB,  no rubs or gallops, no edema   Respiratory:  clear to auscultation bilaterally, no wheezing, rhonchi or rales. GI: soft, nontender, nondistended, + BS MS: no deformity or atrophy Skin: warm and dry  Neuro:  CNs II-XII intact, Strength and sensation are intact Psych: Normal affect   EKG:  EKG is ordered today.  It demonstrates:   NSR, HR 71, normal axis, QTC 432 ms   Recent Labs: 02/12/2015: TSH 0.704 02/13/2015: ALT 11*; BUN 18; Creatinine, Ser 0.97; Hemoglobin 11.6*; Platelets 286; Potassium 4.1; Sodium 134*    Lipid Panel    Component Value Date/Time   CHOL 236* 12/06/2014 1415   TRIG 993* 12/06/2014 1415   HDL 29* 12/06/2014 1415   CHOLHDL 8.1* 12/06/2014 1415   VLDL NOT CALC 12/06/2014  1415   LDLCALC NOT CALC 12/06/2014 1415      ASSESSMENT AND PLAN:  1. Chest Pain:  Recent admission with chest pain in the setting of UTI and gastroenteritis c/b AKI and hyponatremia.  CEs were normal and her Echo demonstrated normal LVF.  However, her Nuclear stress test is abnormal with the suggestion of ischemia in the LCx territory.  She does admit to chest discomfort with moderate to severe exertion. She has multiple risk factors for CAD including HTN, DM2, smoking hx, FHx of CAD.  Risks and benefits of cardiac catheterization have been discussed with the patient.  These include bleeding, infection, kidney damage, stroke, heart attack,  death.  The patient understands these risks and is willing to proceed.  Start ASA 81 mg QD. Will give prn NTG as well.  2. HTN:  Controlled.   3. DM2:  Hold Metformin 24 hours before and 48 hours after.  She can take 1/2 dose of Levemir the night before her cath.  4. Tobacco Abuse:  She has quit smoking.  5. Iodine Allergy:  Will give Prednisone, Benadryl, Pepcid before cath. She will take prednisone 60 mg 18 hours, 12 hours and one hour prior cardiac catheterization.  6. Hyperlipidemia:  Trigs in 7/16 were over 900.  Will arrange repeat FLP. If + CAD on cath, she will need statin Rx initiated.      Medication Changes: Current medicines are reviewed at length with the patient today.  Concerns regarding medicines are as outlined above.  The following changes have been made:   Discontinued Medications   No medications on file   Modified Medications   No medications on file   New Prescriptions   No medications on file   Labs/ tests ordered today include:   No orders of the defined types were placed in this encounter.      Disposition:    FU with Dr. Fransico Him 2 weeks post cath.     Signed, Versie Starks, MHS 02/25/2015 8:07 AM    [facility] [place], [place], [place]  [address] Phone: [phone];  Fax: [phone]

## 2015-02-27 NOTE — Patient Instructions (Addendum)
Your blood sugar is likely elevated both form prednisone used for contrast allergy and less of your usual levemir. REstart Levemir at usual dose tonight.  10 units of Humalog with each meal (if blood sugar is over 200 prior to meal).  If less than 200 - inject 5 units humalog with meals. Once you restart your metformin,  You can stop the humalog, but continue to monitor blood sugars frequently. Watch for low blood sugar symptoms, but less likely with level in office tonight.  Keep a record of your blood sugars outside of the office (3 - 4 times per day) and bring them to the next office visit in 5 days. Follow up tomorrow morning if blood sugars still in 300's.  If blood sugars remain over 400 after your restart your levemir tonight or any new or worsening symptoms such as dizziness, headache, confusion - go to emergency room.    Return to the clinic or go to the nearest emergency room if any of your symptoms worsen or new symptoms occur.  Hyperglycemia Hyperglycemia occurs when the glucose (sugar) in your blood is too high. Hyperglycemia can happen for many reasons, but it most often happens to people who do not know they have diabetes or are not managing their diabetes properly.  CAUSES  Whether you have diabetes or not, there are other causes of hyperglycemia. Hyperglycemia can occur when you have diabetes, but it can also occur in other situations that you might not be as aware of, such as: Diabetes  If you have diabetes and are having problems controlling your blood glucose, hyperglycemia could occur because of some of the following reasons:  Not following your meal plan.  Not taking your diabetes medications or not taking it properly.  Exercising less or doing less activity than you normally do.  Being sick. Pre-diabetes  This cannot be ignored. Before people develop Type 2 diabetes, they almost always have "pre-diabetes." This is when your blood glucose levels are higher than normal,  but not yet high enough to be diagnosed as diabetes. Research has shown that some long-term damage to the body, especially the heart and circulatory system, may already be occurring during pre-diabetes. If you take action to manage your blood glucose when you have pre-diabetes, you may delay or prevent Type 2 diabetes from developing. Stress  If you have diabetes, you may be "diet" controlled or on oral medications or insulin to control your diabetes. However, you may find that your blood glucose is higher than usual in the hospital whether you have diabetes or not. This is often referred to as "stress hyperglycemia." Stress can elevate your blood glucose. This happens because of hormones put out by the body during times of stress. If stress has been the cause of your high blood glucose, it can be followed regularly by your caregiver. That way he/she can make sure your hyperglycemia does not continue to get worse or progress to diabetes. Steroids  Steroids are medications that act on the infection fighting system (immune system) to block inflammation or infection. One side effect can be a rise in blood glucose. Most people can produce enough extra insulin to allow for this rise, but for those who cannot, steroids make blood glucose levels go even higher. It is not unusual for steroid treatments to "uncover" diabetes that is developing. It is not always possible to determine if the hyperglycemia will go away after the steroids are stopped. A special blood test called an A1c is sometimes done  to determine if your blood glucose was elevated before the steroids were started. SYMPTOMS  Thirsty.  Frequent urination.  Dry mouth.  Blurred vision.  Tired or fatigue.  Weakness.  Sleepy.  Tingling in feet or leg. DIAGNOSIS  Diagnosis is made by monitoring blood glucose in one or all of the following ways:  A1c test. This is a chemical found in your blood.  Fingerstick blood glucose  monitoring.  Laboratory results. TREATMENT  First, knowing the cause of the hyperglycemia is important before the hyperglycemia can be treated. Treatment may include, but is not be limited to:  Education.  Change or adjustment in medications.  Change or adjustment in meal plan.  Treatment for an illness, infection, etc.  More frequent blood glucose monitoring.  Change in exercise plan.  Decreasing or stopping steroids.  Lifestyle changes. HOME CARE INSTRUCTIONS   Test your blood glucose as directed.  Exercise regularly. Your caregiver will give you instructions about exercise. Pre-diabetes or diabetes which comes on with stress is helped by exercising.  Eat wholesome, balanced meals. Eat often and at regular, fixed times. Your caregiver or nutritionist will give you a meal plan to guide your sugar intake.  Being at an ideal weight is important. If needed, losing as little as 10 to 15 pounds may help improve blood glucose levels. SEEK MEDICAL CARE IF:   You have questions about medicine, activity, or diet.  You continue to have symptoms (problems such as increased thirst, urination, or weight gain). SEEK IMMEDIATE MEDICAL CARE IF:   You are vomiting or have diarrhea.  Your breath smells fruity.  You are breathing faster or slower.  You are very sleepy or incoherent.  You have numbness, tingling, or pain in your feet or hands.  You have chest pain.  Your symptoms get worse even though you have been following your caregiver's orders.  If you have any other questions or concerns.   This information is not intended to replace advice given to you by your health care provider. Make sure you discuss any questions you have with your health care provider.   Document Released: 10/20/2000 Document Revised: 07/19/2011 Document Reviewed: 12/31/2014 Elsevier Interactive Patient Education [employer].

## 2015-02-27 NOTE — Interval H&P Note (Signed)
Cath Lab Visit (complete for each Cath Lab visit)  Clinical Evaluation Leading to the Procedure:   ACS: No.  Non-ACS:    Anginal Classification: CCS II  Anti-ischemic medical therapy: Minimal Therapy (1 class of medications)  Non-Invasive Test Results: Intermediate-risk stress test findings: cardiac mortality 1-3%/year  Prior CABG: No previous CABG      History and Physical Interval Note:  02/27/2015 12:09 PM  Kitt Sibley  has presented today for surgery, with the diagnosis of cp/abnormal mioview  The various methods of treatment have been discussed with the patient and family. After consideration of risks, benefits and other options for treatment, the patient has consented to  Procedure(s): Left Heart Cath and Coronary Angiography (N/A) as a surgical intervention .  The patient's history has been reviewed, patient examined, no change in status, stable for surgery.  I have reviewed the patient's chart and labs.  Questions were answered to the patient's satisfaction.     [name]

## 2015-02-27 NOTE — Progress Notes (Addendum)
Subjective:    Patient ID: Judith [name], female    DOB: 21-Nov-1950, 64 y.o.   MRN: [id number] This chart was scribed for Merri Ray, MD by Zola Button, Medical Scribe. This patient was seen in Room 4 and the patient's care was started at 6:06 PM.    HPI HPI Comments: Judith [name] is a 64 y.o. female who presents to the [facility] for a for a hospital follow-up. Primary patient of Dr. Tamala Julian, previously followed by Dr. Leward Quan. Last visit in July of this year with Dr. Tamala Julian. She has a history of multiple medical problems, including diabetes and hypertension. She was recently admitted October 5th for chest pain. She had cardiology consult, normal cardiac enzymes. 2D echo performed without concerning features, with plan for outpatient stress test. Cardiology visit on October 17th endorsed history of exertional chest tightness. She had a Myoview in October 14th with EF 60% inferolateral and apical lateral ischemia, intermediate risk. Cardiac cath plan for today. She notes that she is allergic to the contrast. As a result, patient was asked to hold the metformin and was told to reduce her Levemir in half to 12 units last night. She was also given Benadryl IV and prednisone last night and today. Her blood sugar was around 336 per patient this morning. Patient has been fasting since yesterday. She notes that she was found to have 3 blockages which will be addressed at a future visit.  Diabetes: Last evaluated here July 29th. History of peripheral neuropathy. A1c was 10.4 on 10/5, 9.5 prior to that time in February. At last visit, her treatment for diabetes included Levemir 24 units at bedtime and metformin 1000 mg BID. Patient states she is allergic to Novolog, which causes chest congestion and SOB; this was first discovered over 10 years ago. She can take Humolog without any problems. Lab Results  Component Value Date   HGBA1C 10.4* 02/12/2015      Patient Active Problem List   Diagnosis Date Noted  . Abnormal nuclear stress test   . Chest pain syndrome 02/12/2015  . Acute kidney injury ([place]) 02/12/2015  . Acute hyponatremia 02/12/2015  . Chest pain   . History of goiter 09/28/2014  . GERD (gastroesophageal reflux disease) 07/31/2013  . Depression with anxiety 06/01/2013  . Diabetes ([place]) 01/30/2013  . HTN (hypertension) 01/30/2013  . Diabetic neuropathy, painful ([place]) 01/30/2013  . Tobacco user 01/30/2013   Past Medical History  Diagnosis Date  . Hypertension   . Goiter   . Chronic kidney disease   . Allergy   . Arthritis   . Neuropathy ([place])   . History of shingles 06/01/2013  . Abnormal EKG 07/31/2013  . Diabetes mellitus without complication ([facility])     insulin dependent  . GERD (gastroesophageal reflux disease)   . Carpal tunnel syndrome, bilateral    Past Surgical History  Procedure Laterality Date  . Tonsillectomy    . Cesarean section    . Abdominal hysterectomy    . Carpal tunnel release Right Nov 2015  . Right knee    . Right shoulder    . Foot neuroma surgery    . Carpel tunnel syndrome Left Dec 2015  . Carpel tunnel release Right 1992    [name]  . Thyroid surgery     Allergies  Allergen Reactions  . Codeine   . Iodine   . Neosporin [Neomycin-Bacitracin Zn-Polymyx]   . Novolog [Insulin Aspart] Other (See Comments)    "breathing problems"  .  Penicillins   . Tape    Prior to Admission medications   Medication Sig Start Date End Date Taking? Authorizing Provider  albuterol (PROVENTIL HFA) 108 (90 BASE) MCG/ACT inhaler Inhale into the lungs every 6 (six) hours as needed for wheezing or shortness of breath.   Yes Historical Provider, MD  aspirin EC 81 MG tablet Take 1 tablet (81 mg total) by mouth daily. 02/25/15  Yes Scott T Kathlen Mody, PA-C  atenolol-chlorthalidone (TENORETIC) 50-25 MG per tablet TAKE ONE TABLET BY MOUTH ONCE DAILY. 06/25/14  Yes Barton Fanny, MD  buPROPion [facility] SR) 150 MG 12 hr tablet TAKE ONE TABLET  BY MOUTH TWICE DAILY 09/28/14  Yes Barton Fanny, MD  cyclobenzaprine (FLEXERIL) 10 MG tablet TAKE ONE TABLET BY MOUTH AT BEDTIME Patient taking differently: TAKE ONE TABLET BY MOUTH AT BEDTIME AS NEEDED FOR FOOT PAIN 10/21/14  Yes [name], PA-C  DOXYCYCLINE PO Take by mouth.   Yes Historical Provider, MD  FLUoxetine (PROZAC) 20 MG capsule TAKE ONE CAPSULE BY MOUTH ONCE DAILY 09/28/14  Yes Barton Fanny, MD  gabapentin (NEURONTIN) 300 MG capsule Three capsules at lunchtime; take three capsules at bedtime 12/06/14  Yes [name], MD  Insulin Pen Needle 32G X 6 MM MISC Use as directed 09/28/14  Yes Barton Fanny, MD  ipratropium (ATROVENT) 0.03 % nasal spray Place 2 sprays into both nostrils 2 (two) times daily. 02/20/15  Yes Tishira R Brewington, PA-C  metFORMIN (GLUCOPHAGE) 1000 MG tablet TAKE ONE TABLET BY MOUTH TWICE DAILY WITH MEALS 09/28/14  Yes Barton Fanny, MD  omeprazole (PRILOSEC) 20 MG capsule Take 1 capsule (20 mg total) by mouth daily. 09/28/14  Yes Barton Fanny, MD  predniSONE (DELTASONE) 20 MG tablet Take 1 tablet (20 mg total) by mouth as directed. 60 mg at 6 pm night before cath; then at 12 am; again at 6 am day of cath 02/25/15  Yes Scott T Kathlen Mody, PA-C  traMADol (ULTRAM) 50 MG tablet Take 1 tablet by mouth in the afternoon and 2 tablets at bedtime. 12/30/14  Yes [name], MD  triamcinolone cream (KENALOG) 0.1 % Apply 1 application topically 2 (two) times daily. May mix 1:1 with eurerin cream at home 04/02/14  Yes [name], PA-C  clopidogrel (PLAVIX) 75 MG tablet Take 1 tablet (75 mg total) by mouth daily. 02/27/15   [name], MD  Insulin Detemir (LEVEMIR) 100 UNIT/ML Pen Inject 20-25 Units into the skin daily at 10 pm. Patient not taking: Reported on 02/27/2015 09/28/14   Barton Fanny, MD  nitroGLYCERIN (NITROSTAT) 0.4 MG SL tablet Place 1 tablet (0.4 mg total) under the tongue every 5 (five) minutes as needed for chest  pain. Patient not taking: Reported on 02/27/2015 02/25/15   Liliane Shi, PA-C   Social History   Social History  . Marital Status: Divorced    Spouse Name: N/A  . Number of Children: N/A  . Years of Education: N/A   Occupational History  . custodian    Social History Main Topics  . Smoking status: Current Every Day Smoker -- 0.50 packs/day for 41 years    Types: Cigarettes  . Smokeless tobacco: Never Used  . Alcohol Use: No  . Drug Use: No  . Sexual Activity: No   Other Topics Concern  . Not on file   Social History Narrative   Lives with sister in [place].   Divorced twice.     Review  of Systems     Objective:   Physical Exam  Constitutional: She is oriented to person, place, and time. She appears well-developed and well-nourished. No distress.  HENT:  Head: Normocephalic and atraumatic.  Mouth/Throat: Oropharynx is clear and moist. No oropharyngeal exudate.  Eyes: Conjunctivae and EOM are normal. Pupils are equal, round, and reactive to light.  Neck: Neck supple. Carotid bruit is not present.  Cardiovascular: Normal rate, regular rhythm, normal heart sounds and intact distal pulses.   Pulmonary/Chest: Effort normal and breath sounds normal.  Abdominal: Soft. She exhibits no pulsatile midline mass. There is no tenderness.  Musculoskeletal: She exhibits no edema.  Neurological: She is alert and oriented to person, place, and time. No cranial nerve deficit.  Skin: Skin is warm and dry. No rash noted.  Psychiatric: She has a normal mood and affect. Her behavior is normal.  Nursing note and vitals reviewed.     Filed Vitals:   02/27/15 1703  BP: 152/64  Pulse: 77  Temp: 98.2 F (36.8 C)  Resp: 18  Height: 5\' 4"  (1.626 m)  Weight: 166 lb (75.297 kg)  SpO2: 98%    Results for orders placed or performed in visit on 02/27/15  POCT glucose (manual entry)  Result Value Ref Range   POC Glucose 439 (A) 70 - 99 mg/dl        Assessment &  Plan:   Judith [name] is a 64 y.o. female Coronary artery disease involving native coronary artery of native heart with angina pectoris ([place])  -planned intervention after CAD noted recently. Asymptomatic currently.   Diabetes mellitus type 2, insulin dependent ([place]) - Plan: insulin lispro (HUMALOG) 100 UNIT/ML injection, Hyperglycemia - Plan: POCT glucose (manual entry), insulin lispro (HUMALOG) 100 UNIT/ML injection, Contrast media allergy  - uncontrolled likely in combo of decreased insulin with catheterization and need for prednisone with contrast allergy.   -restart levemir at usual dose at night, but will cover with humalog with mealtime dosing until able to restart metformin.   -recommended recheck in 1 day prior to traveling if not significantly improved control.   -RTC/ER precautions.   -follow up in 5 days with home readings.   Meds ordered this encounter  Medications  . DOXYCYCLINE PO    Sig: Take by mouth.  . insulin lispro (HUMALOG) 100 UNIT/ML injection    Sig: Inject 0.05-0.1 mLs (5-10 Units total) into the skin 3 (three) times daily with meals.    Dispense:  3 mL    Refill:  0   Patient Instructions  Your blood sugar is likely elevated both form prednisone used for contrast allergy and less of your usual levemir. REstart Levemir at usual dose tonight.  10 units of Humalog with each meal (if blood sugar is over 200 prior to meal).  If less than 200 - inject 5 units humalog with meals. Once you restart your metformin,  You can stop the humalog, but continue to monitor blood sugars frequently. Watch for low blood sugar symptoms, but less likely with level in office tonight.  Keep a record of your blood sugars outside of the office (3 - 4 times per day) and bring them to the next office visit in 5 days. Follow up tomorrow morning if blood sugars still in 300's.  If blood sugars remain over 400 after your restart your levemir tonight or any new or worsening symptoms such as  dizziness, headache, confusion - go to emergency room.    Return to the clinic or  go to the nearest emergency room if any of your symptoms worsen or new symptoms occur.  Hyperglycemia Hyperglycemia occurs when the glucose (sugar) in your blood is too high. Hyperglycemia can happen for many reasons, but it most often happens to people who do not know they have diabetes or are not managing their diabetes properly.  CAUSES  Whether you have diabetes or not, there are other causes of hyperglycemia. Hyperglycemia can occur when you have diabetes, but it can also occur in other situations that you might not be as aware of, such as: Diabetes  If you have diabetes and are having problems controlling your blood glucose, hyperglycemia could occur because of some of the following reasons:  Not following your meal plan.  Not taking your diabetes medications or not taking it properly.  Exercising less or doing less activity than you normally do.  Being sick. Pre-diabetes  This cannot be ignored. Before people develop Type 2 diabetes, they almost always have "pre-diabetes." This is when your blood glucose levels are higher than normal, but not yet high enough to be diagnosed as diabetes. Research has shown that some long-term damage to the body, especially the heart and circulatory system, may already be occurring during pre-diabetes. If you take action to manage your blood glucose when you have pre-diabetes, you may delay or prevent Type 2 diabetes from developing. Stress  If you have diabetes, you may be "diet" controlled or on oral medications or insulin to control your diabetes. However, you may find that your blood glucose is higher than usual in the hospital whether you have diabetes or not. This is often referred to as "stress hyperglycemia." Stress can elevate your blood glucose. This happens because of hormones put out by the body during times of stress. If stress has been the cause of your high  blood glucose, it can be followed regularly by your caregiver. That way he/she can make sure your hyperglycemia does not continue to get worse or progress to diabetes. Steroids  Steroids are medications that act on the infection fighting system (immune system) to block inflammation or infection. One side effect can be a rise in blood glucose. Most people can produce enough extra insulin to allow for this rise, but for those who cannot, steroids make blood glucose levels go even higher. It is not unusual for steroid treatments to "uncover" diabetes that is developing. It is not always possible to determine if the hyperglycemia will go away after the steroids are stopped. A special blood test called an A1c is sometimes done to determine if your blood glucose was elevated before the steroids were started. SYMPTOMS  Thirsty.  Frequent urination.  Dry mouth.  Blurred vision.  Tired or fatigue.  Weakness.  Sleepy.  Tingling in feet or leg. DIAGNOSIS  Diagnosis is made by monitoring blood glucose in one or all of the following ways:  A1c test. This is a chemical found in your blood.  Fingerstick blood glucose monitoring.  Laboratory results. TREATMENT  First, knowing the cause of the hyperglycemia is important before the hyperglycemia can be treated. Treatment may include, but is not be limited to:  Education.  Change or adjustment in medications.  Change or adjustment in meal plan.  Treatment for an illness, infection, etc.  More frequent blood glucose monitoring.  Change in exercise plan.  Decreasing or stopping steroids.  Lifestyle changes. HOME CARE INSTRUCTIONS   Test your blood glucose as directed.  Exercise regularly. Your caregiver will give you instructions  about exercise. Pre-diabetes or diabetes which comes on with stress is helped by exercising.  Eat wholesome, balanced meals. Eat often and at regular, fixed times. Your caregiver or nutritionist will give you  a meal plan to guide your sugar intake.  Being at an ideal weight is important. If needed, losing as little as 10 to 15 pounds may help improve blood glucose levels. SEEK MEDICAL CARE IF:   You have questions about medicine, activity, or diet.  You continue to have symptoms (problems such as increased thirst, urination, or weight gain). SEEK IMMEDIATE MEDICAL CARE IF:   You are vomiting or have diarrhea.  Your breath smells fruity.  You are breathing faster or slower.  You are very sleepy or incoherent.  You have numbness, tingling, or pain in your feet or hands.  You have chest pain.  Your symptoms get worse even though you have been following your caregiver's orders.  If you have any other questions or concerns.   This information is not intended to replace advice given to you by your health care provider. Make sure you discuss any questions you have with your health care provider.   Document Released: 10/20/2000 Document Revised: 07/19/2011 Document Reviewed: 12/31/2014 Elsevier Interactive Patient Education [employer].       I personally performed the services described in this documentation, which was scribed in my presence. The recorded information has been reviewed and considered, and addended by me as needed.    By signing my name below, I, Zola Button, attest that this documentation has been prepared under the direction and in the presence of Merri Ray, MD.  Electronically Signed: Zola Button, Medical Scribe. 02/27/2015. 6:06 PM.

## 2015-02-27 NOTE — Discharge Instructions (Signed)
° °  HOLD METFORMIN FOR 48 HOURS   Radial Site Care Refer to this sheet in the next few weeks. These instructions provide you with information about caring for yourself after your procedure. Your health care provider may also give you more specific instructions. Your treatment has been planned according to current medical practices, but problems sometimes occur. Call your health care provider if you have any problems or questions after your procedure. WHAT TO EXPECT AFTER THE PROCEDURE After your procedure, it is typical to have the following:  Bruising at the radial site that usually fades within 1-2 weeks.  Blood collecting in the tissue (hematoma) that may be painful to the touch. It should usually decrease in size and tenderness within 1-2 weeks. HOME CARE INSTRUCTIONS  Take medicines only as directed by your health care provider.  You may shower 24-48 hours after the procedure or as directed by your health care provider. Remove the bandage (dressing) and gently wash the site with plain soap and water. Pat the area dry with a clean towel. Do not rub the site, because this may cause bleeding.  Do not take baths, swim, or use a hot tub until your health care provider approves.  Check your insertion site every day for redness, swelling, or drainage.  Do not apply powder or lotion to the site.  Do not flex or bend the affected arm for 24 hours or as directed by your health care provider.  Do not push or pull heavy objects with the affected arm for 24 hours or as directed by your health care provider.  Do not lift over 10 lb (4.5 kg) for 5 days after your procedure or as directed by your health care provider.  Ask your health care provider when it is okay to:  Return to work or school.  Resume usual physical activities or sports.  Resume sexual activity.  Do not drive home if you are discharged the same day as the procedure. Have someone else drive you.  You may drive 24 hours  after the procedure unless otherwise instructed by your health care provider.  Do not operate machinery or power tools for 24 hours after the procedure.  If your procedure was done as an outpatient procedure, which means that you went home the same day as your procedure, a responsible adult should be with you for the first 24 hours after you arrive home.  Keep all follow-up visits as directed by your health care provider. This is important. SEEK MEDICAL CARE IF:  You have a fever.  You have chills.  You have increased bleeding from the radial site. Hold pressure on the site. SEEK IMMEDIATE MEDICAL CARE IF:  You have unusual pain at the radial site.  You have redness, warmth, or swelling at the radial site.  You have drainage (other than a small amount of blood on the dressing) from the radial site.  The radial site is bleeding, and the bleeding does not stop after 30 minutes of holding steady pressure on the site.  Your arm or hand becomes pale, cool, tingly, or numb.   This information is not intended to replace advice given to you by your health care provider. Make sure you discuss any questions you have with your health care provider.   Document Released: 05/29/2010 Document Revised: 05/17/2014 Document Reviewed: 11/12/2013 Elsevier Interactive Patient Education [employer].

## 2015-02-28 ENCOUNTER — Encounter (HOSPITAL_COMMUNITY): Payer: Self-pay | Admitting: Cardiovascular Disease

## 2015-03-04 ENCOUNTER — Telehealth: Payer: Self-pay | Admitting: Cardiovascular Disease

## 2015-03-04 DIAGNOSIS — R9439 Abnormal result of other cardiovascular function study: Secondary | ICD-10-CM

## 2015-03-04 DIAGNOSIS — R079 Chest pain, unspecified: Secondary | ICD-10-CM

## 2015-03-04 MED ORDER — PREDNISONE 20 MG PO TABS
ORAL_TABLET | ORAL | Status: DC
Start: 2015-03-04 — End: 2015-03-07

## 2015-03-04 NOTE — Telephone Encounter (Signed)
New Message      Pt calling stating that she had no knowledge that she was scheduled for a procedure for 03/06/15. Pt states no one ever called her to notify her, discuss with her, or schedule with her. Pt states she received a call from the pharmacy letting her know what she needs to do in regards to medication prior to her procedure and that was the first she had heard about it. Please call back and advise.

## 2015-03-04 NOTE — Telephone Encounter (Signed)
The pt came into the office today to ask about her cardiac catheterization instructions.  I contacted the hospital in regards to the timing of her procedure. The pt requested that I send her pre-procedure instructions through My Chart. I also sent a prescription for prednisone to the pharmacy.

## 2015-03-06 ENCOUNTER — Ambulatory Visit (HOSPITAL_COMMUNITY)
Admission: RE | Admit: 2015-03-06 | Discharge: 2015-03-07 | Disposition: A | Discharge status: Home / Self Care | Payer: Commercial Managed Care - HMO | Source: Ambulatory Visit | Attending: Cardiovascular Disease | Admitting: Cardiovascular Disease

## 2015-03-06 ENCOUNTER — Encounter (HOSPITAL_COMMUNITY)
Admission: RE | Disposition: A | Discharge status: Home / Self Care | Payer: Self-pay | Source: Ambulatory Visit | Attending: Cardiovascular Disease

## 2015-03-06 ENCOUNTER — Encounter (HOSPITAL_COMMUNITY): Payer: Self-pay | Admitting: Cardiovascular Disease

## 2015-03-06 DIAGNOSIS — Z8249 Family history of ischemic heart disease and other diseases of the circulatory system: Secondary | ICD-10-CM | POA: Insufficient documentation

## 2015-03-06 DIAGNOSIS — S301XXA Contusion of abdominal wall, initial encounter: Secondary | ICD-10-CM | POA: Diagnosis not present

## 2015-03-06 DIAGNOSIS — Z79899 Other long term (current) drug therapy: Secondary | ICD-10-CM | POA: Diagnosis not present

## 2015-03-06 DIAGNOSIS — F17210 Nicotine dependence, cigarettes, uncomplicated: Secondary | ICD-10-CM | POA: Insufficient documentation

## 2015-03-06 DIAGNOSIS — I208 Other forms of angina pectoris: Secondary | ICD-10-CM | POA: Diagnosis present

## 2015-03-06 DIAGNOSIS — I129 Hypertensive chronic kidney disease with stage 1 through stage 4 chronic kidney disease, or unspecified chronic kidney disease: Secondary | ICD-10-CM | POA: Diagnosis not present

## 2015-03-06 DIAGNOSIS — I2584 Coronary atherosclerosis due to calcified coronary lesion: Secondary | ICD-10-CM | POA: Diagnosis not present

## 2015-03-06 DIAGNOSIS — E785 Hyperlipidemia, unspecified: Secondary | ICD-10-CM | POA: Diagnosis not present

## 2015-03-06 DIAGNOSIS — Z91041 Radiographic dye allergy status: Secondary | ICD-10-CM | POA: Insufficient documentation

## 2015-03-06 DIAGNOSIS — I25118 Atherosclerotic heart disease of native coronary artery with other forms of angina pectoris: Secondary | ICD-10-CM | POA: Diagnosis present

## 2015-03-06 DIAGNOSIS — Z7982 Long term (current) use of aspirin: Secondary | ICD-10-CM | POA: Diagnosis not present

## 2015-03-06 DIAGNOSIS — I2582 Chronic total occlusion of coronary artery: Secondary | ICD-10-CM | POA: Insufficient documentation

## 2015-03-06 DIAGNOSIS — E1122 Type 2 diabetes mellitus with diabetic chronic kidney disease: Secondary | ICD-10-CM | POA: Insufficient documentation

## 2015-03-06 DIAGNOSIS — Z720 Tobacco use: Secondary | ICD-10-CM | POA: Diagnosis present

## 2015-03-06 DIAGNOSIS — E1140 Type 2 diabetes mellitus with diabetic neuropathy, unspecified: Secondary | ICD-10-CM | POA: Insufficient documentation

## 2015-03-06 DIAGNOSIS — N189 Chronic kidney disease, unspecified: Secondary | ICD-10-CM | POA: Insufficient documentation

## 2015-03-06 DIAGNOSIS — I2089 Other forms of angina pectoris: Secondary | ICD-10-CM | POA: Diagnosis present

## 2015-03-06 DIAGNOSIS — I10 Essential (primary) hypertension: Secondary | ICD-10-CM | POA: Diagnosis present

## 2015-03-06 DIAGNOSIS — Z794 Long term (current) use of insulin: Secondary | ICD-10-CM | POA: Insufficient documentation

## 2015-03-06 HISTORY — DX: Type 2 diabetes mellitus without complications: E11.9

## 2015-03-06 HISTORY — PX: CARDIAC CATHETERIZATION: SHX172

## 2015-03-06 HISTORY — DX: Atherosclerotic heart disease of native coronary artery without angina pectoris: I25.10

## 2015-03-06 HISTORY — DX: Type 2 diabetes mellitus with diabetic polyneuropathy: E11.42

## 2015-03-06 HISTORY — DX: Unspecified asthma, uncomplicated: J45.909

## 2015-03-06 HISTORY — DX: Pneumonia, unspecified organism: J18.9

## 2015-03-06 LAB — CBC
HEMATOCRIT: 34.6 % — AB (ref 36.0–46.0)
HEMOGLOBIN: 12.1 g/dL (ref 12.0–15.0)
MCH: 29.6 pg (ref 26.0–34.0)
MCHC: 35 g/dL (ref 30.0–36.0)
MCV: 84.6 fL (ref 78.0–100.0)
Platelets: 318 10*3/uL (ref 150–400)
RBC: 4.09 MIL/uL (ref 3.87–5.11)
RDW: 12.9 % (ref 11.5–15.5)
WBC: 6.3 10*3/uL (ref 4.0–10.5)

## 2015-03-06 LAB — GLUCOSE, CAPILLARY
GLUCOSE-CAPILLARY: 281 mg/dL — AB (ref 65–99)
GLUCOSE-CAPILLARY: 336 mg/dL — AB (ref 65–99)
GLUCOSE-CAPILLARY: 271 mg/dL — AB (ref 65–99)
Glucose-Capillary: 289 mg/dL — ABNORMAL HIGH (ref 65–99)
Glucose-Capillary: 227 mg/dL — ABNORMAL HIGH (ref 65–99)

## 2015-03-06 LAB — BASIC METABOLIC PANEL
ANION GAP: 12 (ref 5–15)
BUN: 36 mg/dL — AB (ref 6–20)
CO2: 26 mmol/L (ref 22–32)
Calcium: 9.5 mg/dL (ref 8.9–10.3)
Chloride: 94 mmol/L — ABNORMAL LOW (ref 101–111)
Creatinine, Ser: 0.93 mg/dL (ref 0.44–1.00)
GFR calc Af Amer: >60 mL/min (ref >60)
Glucose, Bld: 300 mg/dL — ABNORMAL HIGH (ref 65–99)
POTASSIUM: 4 mmol/L (ref 3.5–5.1)
SODIUM: 132 mmol/L — AB (ref 135–145)

## 2015-03-06 LAB — POCT ACTIVATED CLOTTING TIME: ACTIVATED CLOTTING TIME: 411 s

## 2015-03-06 SURGERY — CORONARY STENT INTERVENTION

## 2015-03-06 MED ORDER — BIVALIRUDIN BOLUS VIA INFUSION - CUPID
INTRAVENOUS | Status: DC | PRN
  Administered 2015-03-06: 63.6 mg via INTRAVENOUS

## 2015-03-06 MED ORDER — SODIUM CHLORIDE 0.9 % WEIGHT BASED INFUSION
3.0000 mL/kg/h | INTRAVENOUS | Status: DC
  Administered 2015-03-06: 3 mL/kg/h via INTRAVENOUS

## 2015-03-06 MED ORDER — FAMOTIDINE IN NACL 20-0.9 MG/50ML-% IV SOLN
20.0000 mg | Freq: Once | INTRAVENOUS | Status: AC
  Administered 2015-03-06: 20 mg via INTRAVENOUS

## 2015-03-06 MED ORDER — ALBUTEROL SULFATE (2.5 MG/3ML) 0.083% IN NEBU: 3.0000 mL | INHALATION_SOLUTION | Freq: Four times a day (QID) | RESPIRATORY_TRACT | Status: DC | PRN

## 2015-03-06 MED ORDER — FLUOXETINE HCL 20 MG PO CAPS
20.0000 mg | ORAL_CAPSULE | Freq: Every day | ORAL | Status: DC
  Administered 2015-03-06: 19:00:00 20 mg via ORAL
  Filled 2015-03-06 (×2): qty 1

## 2015-03-06 MED ORDER — ATENOLOL 50 MG PO TABS
50.0000 mg | ORAL_TABLET | Freq: Every day | ORAL | Status: DC
  Administered 2015-03-06 – 2015-03-07 (×2): 50 mg via ORAL
  Filled 2015-03-06 (×2): qty 1

## 2015-03-06 MED ORDER — MIDAZOLAM HCL 2 MG/2ML IJ SOLN
INTRAMUSCULAR | Status: AC
  Filled 2015-03-06: qty 4

## 2015-03-06 MED ORDER — BIVALIRUDIN 250 MG IV SOLR
INTRAVENOUS | Status: AC
  Filled 2015-03-06: qty 250

## 2015-03-06 MED ORDER — LIVING WELL WITH DIABETES BOOK
Freq: Once | Status: AC
  Administered 2015-03-06: 20:00:00
  Filled 2015-03-06: qty 1

## 2015-03-06 MED ORDER — CLOPIDOGREL BISULFATE 75 MG PO TABS
ORAL_TABLET | ORAL | Status: AC
  Filled 2015-03-06: qty 1

## 2015-03-06 MED ORDER — IOHEXOL 350 MG/ML SOLN
INTRAVENOUS | Status: DC | PRN
  Administered 2015-03-06: 145 mL via INTRAVENOUS

## 2015-03-06 MED ORDER — MIDAZOLAM HCL 2 MG/2ML IJ SOLN
INTRAMUSCULAR | Status: DC | PRN
  Administered 2015-03-06 (×2): 1 mg via INTRAVENOUS
  Administered 2015-03-06: 2 mg via INTRAVENOUS
  Administered 2015-03-06: 1 mg via INTRAVENOUS

## 2015-03-06 MED ORDER — CLOPIDOGREL BISULFATE 75 MG PO TABS
75.0000 mg | ORAL_TABLET | Freq: Every day | ORAL | Status: DC
  Administered 2015-03-07: 75 mg via ORAL
  Filled 2015-03-06: qty 1

## 2015-03-06 MED ORDER — ONDANSETRON HCL 4 MG/2ML IJ SOLN: 4.0000 mg | Freq: Four times a day (QID) | INTRAMUSCULAR | Status: DC | PRN

## 2015-03-06 MED ORDER — ACETAMINOPHEN 325 MG PO TABS
650.0000 mg | ORAL_TABLET | ORAL | Status: DC | PRN
  Administered 2015-03-06: 650 mg via ORAL
  Filled 2015-03-06: qty 2

## 2015-03-06 MED ORDER — NITROGLYCERIN IN D5W 200-5 MCG/ML-% IV SOLN
INTRAVENOUS | Status: AC
  Filled 2015-03-06: qty 250

## 2015-03-06 MED ORDER — DIPHENHYDRAMINE HCL 50 MG/ML IJ SOLN
INTRAMUSCULAR | Status: AC
  Administered 2015-03-06: 25 mg via INTRAVENOUS
  Filled 2015-03-06: qty 1

## 2015-03-06 MED ORDER — CHLORTHALIDONE 25 MG PO TABS
25.0000 mg | ORAL_TABLET | Freq: Every day | ORAL | Status: DC
  Administered 2015-03-06 – 2015-03-07 (×2): 25 mg via ORAL
  Filled 2015-03-06 (×3): qty 1

## 2015-03-06 MED ORDER — VERAPAMIL HCL 2.5 MG/ML IV SOLN
INTRAVENOUS | Status: AC
  Filled 2015-03-06: qty 2

## 2015-03-06 MED ORDER — ATENOLOL-CHLORTHALIDONE 50-25 MG PO TABS: 1.0000 | ORAL_TABLET | Freq: Every day | ORAL | Status: DC

## 2015-03-06 MED ORDER — ASPIRIN EC 81 MG PO TBEC
81.0000 mg | DELAYED_RELEASE_TABLET | Freq: Every day | ORAL | Status: DC
  Administered 2015-03-07: 11:00:00 81 mg via ORAL
  Filled 2015-03-06: qty 1

## 2015-03-06 MED ORDER — INSULIN LISPRO 100 UNIT/ML ~~LOC~~ SOLN
10.0000 [IU] | Freq: Three times a day (TID) | SUBCUTANEOUS | Status: DC
  Administered 2015-03-06: 15 [IU] via SUBCUTANEOUS
  Administered 2015-03-07: 14:00:00 5 [IU] via SUBCUTANEOUS
  Filled 2015-03-06 (×3): qty 10

## 2015-03-06 MED ORDER — INSULIN DETEMIR 100 UNIT/ML ~~LOC~~ SOLN
15.0000 [IU] | Freq: Two times a day (BID) | SUBCUTANEOUS | Status: DC
  Filled 2015-03-06 (×2): qty 0.15

## 2015-03-06 MED ORDER — LIDOCAINE HCL (PF) 1 % IJ SOLN
INTRAMUSCULAR | Status: DC | PRN
  Administered 2015-03-06: 10:00:00

## 2015-03-06 MED ORDER — NITROGLYCERIN 0.4 MG SL SUBL: 0.4000 mg | SUBLINGUAL_TABLET | SUBLINGUAL | Status: DC | PRN

## 2015-03-06 MED ORDER — SODIUM CHLORIDE 0.9 % IJ SOLN: 3.0000 mL | INTRAMUSCULAR | Status: DC | PRN

## 2015-03-06 MED ORDER — INSULIN DETEMIR 100 UNIT/ML ~~LOC~~ SOLN
30.0000 [IU] | Freq: Every day | SUBCUTANEOUS | Status: DC
  Administered 2015-03-06: 21:00:00 30 [IU] via SUBCUTANEOUS
  Filled 2015-03-06 (×2): qty 0.3

## 2015-03-06 MED ORDER — FENTANYL CITRATE (PF) 100 MCG/2ML IJ SOLN
INTRAMUSCULAR | Status: DC | PRN
  Administered 2015-03-06 (×4): 25 ug via INTRAVENOUS

## 2015-03-06 MED ORDER — SODIUM CHLORIDE 0.9 % IJ SOLN: 3.0000 mL | Freq: Two times a day (BID) | INTRAMUSCULAR | Status: DC

## 2015-03-06 MED ORDER — SODIUM CHLORIDE 0.9 % IV SOLN
250.0000 mg | INTRAVENOUS | Status: DC | PRN
  Administered 2015-03-06 (×2): 1.75 mg/kg/h via INTRAVENOUS

## 2015-03-06 MED ORDER — SODIUM CHLORIDE 0.9 % IV SOLN: 250.0000 mL | INTRAVENOUS | Status: DC | PRN

## 2015-03-06 MED ORDER — ASPIRIN 81 MG PO CHEW
CHEWABLE_TABLET | ORAL | Status: AC
  Administered 2015-03-06: 81 mg via ORAL
  Filled 2015-03-06: qty 1

## 2015-03-06 MED ORDER — ANGIOPLASTY BOOK
Freq: Once | Status: AC
  Administered 2015-03-06: 20:00:00
  Filled 2015-03-06: qty 1

## 2015-03-06 MED ORDER — NITROGLYCERIN 1 MG/10 ML FOR IR/CATH LAB
INTRA_ARTERIAL | Status: DC | PRN
  Administered 2015-03-06 (×3): 150 ug via INTRACORONARY

## 2015-03-06 MED ORDER — CLOPIDOGREL BISULFATE 75 MG PO TABS
75.0000 mg | ORAL_TABLET | ORAL | Status: AC
  Administered 2015-03-06: 75 mg via ORAL

## 2015-03-06 MED ORDER — ASPIRIN 81 MG PO CHEW
81.0000 mg | CHEWABLE_TABLET | ORAL | Status: AC
  Administered 2015-03-06: 81 mg via ORAL

## 2015-03-06 MED ORDER — SODIUM CHLORIDE 0.9 % WEIGHT BASED INFUSION: 1.0000 mL/kg/h | INTRAVENOUS | Status: DC

## 2015-03-06 MED ORDER — FENTANYL CITRATE (PF) 100 MCG/2ML IJ SOLN
INTRAMUSCULAR | Status: AC
  Filled 2015-03-06: qty 4

## 2015-03-06 MED ORDER — FAMOTIDINE IN NACL 20-0.9 MG/50ML-% IV SOLN
INTRAVENOUS | Status: AC
  Administered 2015-03-06: 20 mg via INTRAVENOUS
  Filled 2015-03-06: qty 50

## 2015-03-06 MED ORDER — GABAPENTIN 300 MG PO CAPS
300.0000 mg | ORAL_CAPSULE | Freq: Every day | ORAL | Status: DC
  Administered 2015-03-06: 300 mg via ORAL
  Filled 2015-03-06: qty 1

## 2015-03-06 MED ORDER — INSULIN LISPRO 100 UNIT/ML ~~LOC~~ SOLN: 10.0000 [IU] | Freq: Three times a day (TID) | SUBCUTANEOUS | Status: DC

## 2015-03-06 MED ORDER — DIPHENHYDRAMINE HCL 50 MG/ML IJ SOLN
25.0000 mg | Freq: Once | INTRAMUSCULAR | Status: AC
  Administered 2015-03-06: 25 mg via INTRAVENOUS

## 2015-03-06 MED ORDER — SODIUM CHLORIDE 0.9 % IV SOLN: INTRAVENOUS | Status: AC

## 2015-03-06 MED ORDER — SODIUM CHLORIDE 0.9 % IJ SOLN
3.0000 mL | Freq: Two times a day (BID) | INTRAMUSCULAR | Status: DC
  Administered 2015-03-06: 3 mL via INTRAVENOUS

## 2015-03-06 MED ORDER — HEPARIN SODIUM (PORCINE) 1000 UNIT/ML IJ SOLN
INTRAMUSCULAR | Status: AC
  Filled 2015-03-06: qty 1

## 2015-03-06 MED ORDER — BUPROPION HCL ER (SR) 150 MG PO TB12
150.0000 mg | ORAL_TABLET | Freq: Two times a day (BID) | ORAL | Status: DC
  Administered 2015-03-06 – 2015-03-07 (×3): 150 mg via ORAL
  Filled 2015-03-06 (×4): qty 1

## 2015-03-06 SURGICAL SUPPLY — 25 items
BAG SNAP BAND KOVER 36X36 (MISCELLANEOUS) ×3 IMPLANT
BALLN EMERGE MR 3.0X12 (BALLOONS) ×3
BALLN MINITREK RX 1.5X15 (BALLOONS) ×3
BALLN ~~LOC~~ EMERGE MR 2.5X20 (BALLOONS) ×3
BALLN ~~LOC~~ EMERGE MR 4.0X8 (BALLOONS) ×3
BALLOON EMERGE MR 3.0X12 (BALLOONS) ×1 IMPLANT
BALLOON MINITREK RX 1.5X15 (BALLOONS) ×1 IMPLANT
BALLOON ~~LOC~~ EMERGE MR 2.5X20 (BALLOONS) ×1 IMPLANT
BALLOON ~~LOC~~ EMERGE MR 4.0X8 (BALLOONS) ×1 IMPLANT
CATH VISTA GUIDE 7FR XB 3.5 (CATHETERS) ×3 IMPLANT
DEVICE CLOSURE PERCLS PRGLD 6F (VASCULAR PRODUCTS) ×1 IMPLANT
ELECT DEFIB PAD ADLT CADENCE (PAD) ×3 IMPLANT
KIT ENCORE 26 ADVANTAGE (KITS) ×6 IMPLANT
KIT HEART LEFT (KITS) ×3 IMPLANT
LUBRICANT ROTAGLIDE 20CC VIAL (MISCELLANEOUS) ×3 IMPLANT
PACK CARDIAC CATHETERIZATION (CUSTOM PROCEDURE TRAY) ×3 IMPLANT
PERCLOSE PROGLIDE 6F (VASCULAR PRODUCTS) ×3
SHEATH PINNACLE 7F 10CM (SHEATH) ×3 IMPLANT
STENT PROMUS PREM MR 2.5X20 (Permanent Stent) ×3 IMPLANT
STENT PROMUS PREM MR 3.5X12 (Permanent Stent) ×3 IMPLANT
TRANSDUCER W/STOPCOCK (MISCELLANEOUS) ×3 IMPLANT
TUBING CIL FLEX 10 FLL-RA (TUBING) ×3 IMPLANT
WIRE COUGAR XT STRL 190CM (WIRE) ×6 IMPLANT
WIRE EMERALD 3MM-J .035X150CM (WIRE) ×3 IMPLANT
WIRE HI TORQ WHISPER MS 190CM (WIRE) ×3 IMPLANT

## 2015-03-06 NOTE — Progress Notes (Signed)
Paged for insulin dose changes, per pt, she takes 30 units of levemir at night. As for her humalog, she take 3 times daily, 10 units if BG 200-300, 15 units if BG 301 - 400, call MD if higher. This has been verified with patient. Apparently this is the dose she takes if she is off metformin. Check BG ACHS 4 times daily. Refer to hypoglycemic protocol if BG <70 suspected.   Hilbert Corrigan PA Pager: [phone]

## 2015-03-06 NOTE — Progress Notes (Signed)
Dressing changed to rt groin. No hematoma.

## 2015-03-06 NOTE — Interval H&P Note (Signed)
History and Physical Interval Note:  03/06/2015 7:53 AM  Judith [name]  has presented today for surgery, with the diagnosis of CAD  The various methods of treatment have been discussed with the patient and family. After consideration of risks, benefits and other options for treatment, the patient has consented to  Procedure(s): Coronary/Graft Atherectomy (N/A) as a surgical intervention .  The patient's history has been reviewed, patient examined, no change in status, stable for surgery.  I have reviewed the patient's chart and labs.  Questions were answered to the patient's satisfaction.    Known to me from previous cath last week. Plan for PCI of the dominant circ bifurcation today. CCS 3 angina, large area of ischemic on Myoview.  Judith [name]

## 2015-03-06 NOTE — H&P (View-Only) (Signed)
Cardiology Office Note   Date:  02/25/2015   ID:  Judith [name], DOB Nov 18, 1950, MRN [id number]  PCP:  Judith Lennox, MD  Cardiologist:  Dr. Fransico [name]   Electrophysiologist:  n/a   Chief Complaint  Patient presents with  . Hospitalization Follow-up    admx with chest pain  . Follow-up    abnormal stress test     History of Present Illness: Judith [name] is a 64 y.o. female with a hx of diabetes, HTN, tobacco abuse.  She was evaluated by Dr. Radford [name] in 4/15 for chest pain.  Echo demonstrated normal LVF.  Nuclear stress test was cancelled when her symptoms improved with PPI therapy.  Last seen by Dr. Fransico [name] 4/15.  Admitted 10/5-10/6 with chest pain in the setting of UTI and viral gastroenteritis complicated by AKI and hypoNa.  She was evaluated by Cardiology.  CEs remained neg.  Echo demonstrated mod LVH, EF 60-65%, no RWMA, Gr 1 DD, MAC.  OP stress test was recommended.  This was done 02/21/15 and demonstrated EF 60% with inf-lat and apical lateral ischemia (intermediate risk).  She returns for FU.    She does note a history of exertional chest tightness. This is usually with more extreme activities. She denies any chest discomfort at rest or with minimal activities. She denies significant dyspnea. She denies orthopnea, PND or edema. She denies syncope or near syncope.   Studies/Reports Reviewed Today:  Myoview 02/21/15 EF 60%, inferolateral and apical lateral ischemia, intermediate risk  Echo 02/13/15 Moderate LVH, EF 60-65%, normal wall motion, grade 1 diastolic dysfunction, MAC    Past Medical History  Diagnosis Date  . Hypertension   . Goiter   . Chronic kidney disease   . Allergy   . Arthritis   . Neuropathy ([place])   . History of shingles 06/01/2013  . Abnormal EKG 07/31/2013  . Diabetes mellitus without complication (HCC)     insulin dependent  . GERD (gastroesophageal reflux disease)   . Carpal tunnel syndrome, bilateral     Past Surgical History    Procedure Laterality Date  . Tonsillectomy    . Cesarean section    . Abdominal hysterectomy    . Carpal tunnel release Right Nov 2015  . Right knee    . Right shoulder    . Foot neuroma surgery    . Carpel tunnel syndrome Left Dec 2015  . Carpel tunnel release Right 1992    Judith [name]  . Thyroid surgery       Current Outpatient Prescriptions  Medication Sig Dispense Refill  . albuterol (PROVENTIL HFA) 108 (90 BASE) MCG/ACT inhaler Inhale into the lungs every 6 (six) hours as needed for wheezing or shortness of breath.    Marland Kitchen atenolol-chlorthalidone (TENORETIC) 50-25 MG per tablet TAKE ONE TABLET BY MOUTH ONCE DAILY. 30 tablet 5  . buPROPion (WELLBUTRIN SR) 150 MG 12 hr tablet TAKE ONE TABLET BY MOUTH TWICE DAILY 60 tablet 5  . ciprofloxacin-dexamethasone (CIPRODEX) otic suspension Place 4 drops into the right ear 2 (two) times daily. (Patient not taking: Reported on 02/12/2015) 7.5 mL 0  . cyclobenzaprine (FLEXERIL) 10 MG tablet TAKE ONE TABLET BY MOUTH AT BEDTIME 30 tablet 1  . FLUoxetine (PROZAC) 20 MG capsule TAKE ONE CAPSULE BY MOUTH ONCE DAILY 30 capsule 5  . gabapentin (NEURONTIN) 300 MG capsule Three capsules at lunchtime; take three capsules at bedtime 180 capsule 5  . Insulin Detemir (LEVEMIR) 100 UNIT/ML Pen Inject 20-25 Units into the skin daily at  10 pm. 15 mL 11  . Insulin Pen Needle 32G X 6 MM MISC Use as directed 50 each 5  . ipratropium (ATROVENT) 0.03 % nasal spray Place 2 sprays into both nostrils 2 (two) times daily. 30 mL 0  . metFORMIN (GLUCOPHAGE) 1000 MG tablet TAKE ONE TABLET BY MOUTH TWICE DAILY WITH MEALS 60 tablet 5  . omeprazole (PRILOSEC) 20 MG capsule Take 1 capsule (20 mg total) by mouth daily. 30 capsule 5  . traMADol (ULTRAM) 50 MG tablet Take 1 tablet by mouth in the afternoon and 2 tablets at bedtime. 90 tablet 5  . triamcinolone cream (KENALOG) 0.1 % Apply 1 application topically 2 (two) times daily. May mix 1:1 with eurerin cream at home 454 g 0   No  current facility-administered medications for this visit.    Allergies:   Codeine; Iodine; Neosporin; Penicillins; and Tape    Social History:  The patient  reports that she has been smoking Cigarettes.  She has a 20.5 pack-year smoking history. She has never used smokeless tobacco. She reports that she does not drink alcohol or use illicit drugs.   Family History:  The patient's family history includes Allergies in her sister; Breast cancer in her maternal grandmother; COPD in her father; Cancer in her mother; Emphysema in her maternal grandfather and paternal grandfather; Heart attack in her father; Heart disease in her father; Hypertension in her father; Leukemia in her paternal grandmother.    ROS:   Please see the history of present illness.   Review of Systems  All other systems reviewed and are negative.     PHYSICAL EXAM: VS:  There were no vitals taken for this visit.    Wt Readings from Last 3 Encounters:  02/21/15 168 lb (76.204 kg)  02/20/15 168 lb (76.204 kg)  02/13/15 185 lb 1.6 oz (83.961 kg)     GEN: Well nourished, well developed, in no acute distress HEENT: normal Neck: no JVD, no carotid bruits, no masses Cardiac:  Normal D1/V6, RRR; 1/6 systolic murmur RUSB,  no rubs or gallops, no edema   Respiratory:  clear to auscultation bilaterally, no wheezing, rhonchi or rales. GI: soft, nontender, nondistended, + BS MS: no deformity or atrophy Skin: warm and dry  Neuro:  CNs II-XII intact, Strength and sensation are intact Psych: Normal affect   EKG:  EKG is ordered today.  It demonstrates:   NSR, HR 71, normal axis, QTC 432 ms   Recent Labs: 02/12/2015: TSH 0.704 02/13/2015: ALT 11*; BUN 18; Creatinine, Ser 0.97; Hemoglobin 11.6*; Platelets 286; Potassium 4.1; Sodium 134*    Lipid Panel    Component Value Date/Time   CHOL 236* 12/06/2014 1415   TRIG 993* 12/06/2014 1415   HDL 29* 12/06/2014 1415   CHOLHDL 8.1* 12/06/2014 1415   VLDL NOT CALC 12/06/2014  1415   LDLCALC NOT CALC 12/06/2014 1415      ASSESSMENT AND PLAN:  1. Chest Pain:  Recent admission with chest pain in the setting of UTI and gastroenteritis c/b AKI and hyponatremia.  CEs were normal and her Echo demonstrated normal LVF.  However, her Nuclear stress test is abnormal with the suggestion of ischemia in the LCx territory.  She does admit to chest discomfort with moderate to severe exertion. She has multiple risk factors for CAD including HTN, DM2, smoking hx, FHx of CAD.  Risks and benefits of cardiac catheterization have been discussed with the patient.  These include bleeding, infection, kidney damage, stroke, heart attack,  death.  The patient understands these risks and is willing to proceed.  Start ASA 81 mg QD. Will give prn NTG as well.  2. HTN:  Controlled.   3. DM2:  Hold Metformin 24 hours before and 48 hours after.  She can take 1/2 dose of Levemir the night before her cath.  4. Tobacco Abuse:  She has quit smoking.  5. Iodine Allergy:  Will give Prednisone, Benadryl, Pepcid before cath. She will take prednisone 60 mg 18 hours, 12 hours and one hour prior cardiac catheterization.  6. Hyperlipidemia:  Trigs in 7/16 were over 900.  Will arrange repeat FLP. If + CAD on cath, she will need statin Rx initiated.      Medication Changes: Current medicines are reviewed at length with the patient today.  Concerns regarding medicines are as outlined above.  The following changes have been made:   Discontinued Medications   No medications on file   Modified Medications   No medications on file   New Prescriptions   No medications on file   Labs/ tests ordered today include:   No orders of the defined types were placed in this encounter.      Disposition:    FU with Dr. Fransico [name] 2 weeks post cath.     Signed, Versie Starks, MHS 02/25/2015 8:07 AM    [facility] [address], [place], [place]  [id number] Phone: [phone];  Fax: [phone]

## 2015-03-07 ENCOUNTER — Ambulatory Visit (HOSPITAL_BASED_OUTPATIENT_CLINIC_OR_DEPARTMENT_OTHER): Payer: Commercial Managed Care - HMO

## 2015-03-07 ENCOUNTER — Encounter (HOSPITAL_COMMUNITY): Payer: Self-pay | Admitting: Physician Assistant

## 2015-03-07 DIAGNOSIS — I10 Essential (primary) hypertension: Secondary | ICD-10-CM | POA: Diagnosis not present

## 2015-03-07 DIAGNOSIS — S301XXA Contusion of abdominal wall, initial encounter: Secondary | ICD-10-CM | POA: Diagnosis not present

## 2015-03-07 DIAGNOSIS — I2582 Chronic total occlusion of coronary artery: Secondary | ICD-10-CM | POA: Diagnosis not present

## 2015-03-07 DIAGNOSIS — I208 Other forms of angina pectoris: Secondary | ICD-10-CM | POA: Diagnosis not present

## 2015-03-07 DIAGNOSIS — I129 Hypertensive chronic kidney disease with stage 1 through stage 4 chronic kidney disease, or unspecified chronic kidney disease: Secondary | ICD-10-CM | POA: Diagnosis not present

## 2015-03-07 DIAGNOSIS — I2584 Coronary atherosclerosis due to calcified coronary lesion: Secondary | ICD-10-CM | POA: Diagnosis not present

## 2015-03-07 DIAGNOSIS — I25118 Atherosclerotic heart disease of native coronary artery with other forms of angina pectoris: Secondary | ICD-10-CM | POA: Diagnosis not present

## 2015-03-07 LAB — CBC
HEMATOCRIT: 32.5 % — AB (ref 36.0–46.0)
HEMOGLOBIN: 11.4 g/dL — AB (ref 12.0–15.0)
MCH: 29.6 pg (ref 26.0–34.0)
MCHC: 35.1 g/dL (ref 30.0–36.0)
MCV: 84.4 fL (ref 78.0–100.0)
PLATELETS: 309 10*3/uL (ref 150–400)
RBC: 3.85 MIL/uL — ABNORMAL LOW (ref 3.87–5.11)
RDW: 13 % (ref 11.5–15.5)
WBC: 11.5 10*3/uL — ABNORMAL HIGH (ref 4.0–10.5)

## 2015-03-07 LAB — BASIC METABOLIC PANEL
ANION GAP: 15 (ref 5–15)
BUN: 23 mg/dL — AB (ref 6–20)
CO2: 24 mmol/L (ref 22–32)
Calcium: 9.7 mg/dL (ref 8.9–10.3)
Chloride: 100 mmol/L — ABNORMAL LOW (ref 101–111)
Creatinine, Ser: 0.69 mg/dL (ref 0.44–1.00)
GFR calc Af Amer: >60 mL/min (ref >60)
GFR calc non Af Amer: >60 mL/min (ref >60)
GLUCOSE: 135 mg/dL — AB (ref 65–99)
POTASSIUM: 3.8 mmol/L (ref 3.5–5.1)
Sodium: 139 mmol/L (ref 135–145)

## 2015-03-07 LAB — GLUCOSE, CAPILLARY
Glucose-Capillary: 139 mg/dL — ABNORMAL HIGH (ref 65–99)
Glucose-Capillary: 162 mg/dL — ABNORMAL HIGH (ref 65–99)

## 2015-03-07 MED ORDER — PANTOPRAZOLE SODIUM 40 MG PO TBEC: 40.0000 mg | DELAYED_RELEASE_TABLET | Freq: Every day | ORAL | Status: DC

## 2015-03-07 NOTE — Progress Notes (Signed)
Patient Name: Judith [name] Date of Encounter: January 08, 202016  Principal Problem:   Exertional angina (HCC) Active Problems:   HTN (hypertension)   Tobacco user   Groin hematoma   Primary Cardiologist: Dr Radford Pax  Patient Profile: 64 yo female with a hx of diabetes, HTN, tobacco abuse. EF nl by recent echo. CP during acute illness >>OP nuc w/ inf-lat and apical lateral ischemia>>admit 10/26 for cath.   SUBJECTIVE: No chest pain or sob  OBJECTIVE Filed Vitals:   03/06/15 1900 03/06/15 2000 03/07/15 0336 03/07/15 0732  BP: 127/63  122/51 114/59  Pulse: 82  69 67  Temp: 98.1 F (36.7 C)  97.9 F (36.6 C) 97.5 F (36.4 C)  TempSrc: Oral  Oral Oral  Resp: '24 21 14 12  ' Height:      Weight:   180 lb 1.9 oz (81.7 kg)   SpO2: 98%  98% 97%    Intake/Output Summary (Last 24 hours) at 03/07/15 0808 Last data filed at 03/07/15 0734  Gross per 24 hour  Intake 1084.17 ml  Output      0 ml  Net 1084.17 ml   Filed Weights   03/06/15 0548 03/07/15 0336  Weight: 187 lb (84.823 kg) 180 lb 1.9 oz (81.7 kg)    PHYSICAL EXAM General: Well developed, well nourished, female in no acute distress. Head: Normocephalic, atraumatic.  Neck: Supple without bruits, JVD not elevated Lungs:  Resp regular and unlabored, CTA. Heart: RRR, S1, S2, no S3, S4, soft murmur; no rub. Abdomen: Soft, non-tender, non-distended, BS + x 4.  Extremities: No clubbing, cyanosis, edema. R groin + bruit and possible hematoma; distal pulses intact Neuro: Alert and oriented X 3. Moves all extremities spontaneously. Psych: Normal affect.  LABS: CBC:  Recent Labs  03/06/15 0641 03/07/15 0559  WBC 6.3 11.5*  HGB 12.1 11.4*  HCT 34.6* 32.5*  MCV 84.6 84.4  PLT 318 480   Basic Metabolic Panel:  Recent Labs  03/06/15 0641 03/07/15 0559  NA 132* 139  K 4.0 3.8  CL 94* 100*  CO2 26 24  GLUCOSE 300* 135*  BUN 36* 23*  CREATININE 0.93 0.69  CALCIUM 9.5 9.7   TELE:   SR, rare PVCs    Cath  Report: 03/06/2015 Dominance: Right   Left Anterior Descending   . Prox LAD to Mid LAD lesion, 40% stenosed. calcified diffuse.     Left Circumflex   . Mid Cx lesion, 80% stenosed. calcified.   . Angioplasty: Pre-stent angioplasty was performed. A drug-eluting stent was placed. A post-stent angioplasty was not performed. Maximum pressure: 16 atm. The pre-interventional distal flow is normal (TIMI 3). The post-interventional distal flow is normal (TIMI 3). The intervention was successful. No complications occured at this lesion. IC nitroglycerin was given. The mid circumflex/obtuse marginal bifurcation is treated. There is complex bifurcation stenosis present. I planned on using a bifurcation stenting technique. Angiomax is used for anticoagulation. The patient is adequately preloaded with aspirin and Plavix. A 7 French XB 3.5 cm guide catheter was utilized. Once a therapeutic ACT was achieved, a cougar guidewire is advanced into the OM. A second cougar guidewire is advanced into the distal circumflex. The obtuse marginal branches predilated with a 2.5 x 20 mm noncompliant balloon. The circumflex is then predilated with a 3.0 mm balloon. With the balloon present in the circumflex, a 2.5 x 20 mm Promus DES his advanced into the OM branch. The stent is deployed so that the ostium is covered.  Simultaneously, the circumflex balloon is dilated in order to keep the circumflex portion patent. The circumflex stent is then advanced so that it lays across the OM origin. A 3.5 x 12 mm Promus DES was chosen. This stent is deployed at 14 atm. I tried to rewire the OM branch but was unsuccessful. I decided to post dilate the circumflex stent in order to open the cells better. A 4.0 mm noncompliant balloon is chosen. It is dilated to 16 atm. I was then able to wire the obtuse marginal branch with a whisper wire. The OM stent is postdilated with a 2.5 mm noncompliant balloon. A final kissing balloon is done with the 4.0 mm  balloon and the circumflex and the 2.5 mm balloon in the obtuse marginal. At the completion of the procedure there is 0% residual stenosis in the circumflex and 20% residual stenosis at the obtuse marginal origin. There is TIMI-3 flow into both vessels.  . There is no residual stenosis post intervention.     . Second Obtuse Marginal Branch   . Ost 2nd Mrg to 2nd Mrg lesion, 70% stenosed. calcified diffuse.   . Angioplasty: Pre-stent angioplasty was performed. A drug-eluting stent was placed. A post-stent angioplasty was not performed. The pre-interventional distal flow is normal (TIMI 3). The post-interventional distal flow is normal (TIMI 3). The intervention was successful. No complications occured at this lesion. IC nitroglycerin was given. See description under LCx lesion, bifurcation stenting technique utilized  . There is a 20% residual stenosis post intervention.       Right Coronary Artery  Dist RCA filled by collaterals from Dist LAD.   Marland Kitchen Prox RCA lesion, 100% stenosed. calcified diffuse.    Successful bifurcation stenting of the left circumflex/obtuse marginal bifurcation using drug-eluting stent platforms 3.5 x 12 mm Promus DES in the CFX,  2.5 x 20 mm Promus DES in the OM  Continue dual antiplatelet therapy with aspirin and Plavix for a minimum of 12 months. Would consider long-term dual antiplatelet therapy if tolerated.   Current Medications:  . aspirin EC  81 mg Oral Daily  . atenolol  50 mg Oral Daily   And  . chlorthalidone  25 mg Oral Daily  . buPROPion  150 mg Oral BID  . clopidogrel  75 mg Oral Daily  . FLUoxetine  20 mg Oral Daily  . gabapentin  300 mg Oral QHS  . insulin detemir  30 Units Subcutaneous QHS  . insulin lispro  10-15 Units Subcutaneous TID WC  . sodium chloride  3 mL Intravenous Q12H      ASSESSMENT AND PLAN: Principal Problem:   Exertional angina (HCC) - s/p stents x 2, see cath report above - generally doing well, but concern for R pseudo -  on ASA, BB (?increase dose), needs statin - MD advise on adding Lipitor 20 mg (lipid and LFTs ordered as OP, not done yet)    R groin bruit; hematoma - ck [place], if negative, no further workup  Otherwise, continue home rx; MD advise on increasing atenolol to 50 mg bid  Active Problems:   HTN (hypertension)   Tobacco user  Plan: d/c today  Signed, Rosaria Ferries , PA-C 8:08 AM 05-Feb-202016  Patient seen and examined  Agree with findings as noted by R Barrrett USN negative for psuedoaneurysm.l  BP was low at lunch  Will recheck  Ambulate  D/C will be based on this.  Dorris Carnes  05-Feb-202016

## 2015-03-07 NOTE — Discharge Summary (Signed)
CARDIOLOGY DISCHARGE SUMMARY   Patient ID: Judith [name] MRN: [id number] DOB/AGE: March 25, 1951 64 y.o.  Admit date: 03/06/2015 Discharge date: 03/05/2015  PCP: Reginia Forts, MD Primary Cardiologist: Dr Radford Pax  Primary Discharge Diagnosis:  Exertional angina - s.p DES x 2 to the bifurcation of the circumflex, OM1 Secondary Discharge Diagnosis:  Active Problems:   HTN (hypertension)   Tobacco user   Groin hematoma  Procedures: Cardiac catheterization, coronary arteriogram, left ventriculogram, PTCA and drug-eluting stent 2   Hospital Course: Judith [name] is a 64 y.o. female with a history of diabetes, hypertension and tobacco abuse. She had chest pain during her acute illness and [name] echo showed a normal EF, but a nuclear study showed inferolateral and apical lateral ischemia. She was admitted on 10/26 for cath.  Cardiac catheterization results are below. He had overlapping drug-eluting stents at the circumflex/OM 1 bifurcation, reducing the stenosis to 0. He tolerated the procedure well.  On 03/05/2015, she was seen by Dr. Harrington Challenger and all data were reviewed. She had her right groin bruit and a possible cord hematemesis on ultrasound was ordered. Final results are pending, but preliminary results showed no pseudoaneurysm.  Consideration was given to increasing her beta blocker, but her blood pressure was low at times, and she has had dizziness with exertion. Her orthostatics or mildly positive but her systolic blood pressure never dropped below 123 so no medication adjustments are needed. No further inpatient workup is indicated and she is considered stable for discharge, to follow up as [name] outpatient.  Labs:   Lab Results  Component Value Date   WBC 11.5* 03/05/2015   HGB 11.4* 03/05/2015   HCT 32.5* 03/05/2015   MCV 84.4 03/05/2015   PLT 309 03/05/2015     Recent Labs Lab 03/07/15 0559  NA 139  K 3.8  CL 100*  CO2 24  BUN 23*  CREATININE 0.69  CALCIUM 9.7  GLUCOSE  135*    Cardiac Cath: 10/27 Dominance: Right   Left Anterior Descending   . Prox LAD to Mid LAD lesion, 40% stenosed. calcified diffuse.     Left Circumflex   . Mid Cx lesion, 80% stenosed. calcified.   . Angioplasty: Pre-stent angioplasty was performed. A drug-eluting stent was placed. A post-stent angioplasty was not performed. Maximum pressure: 16 atm. The pre-interventional distal flow is normal (TIMI 3). The post-interventional distal flow is normal (TIMI 3). The intervention was successful. No complications occured at this lesion. IC nitroglycerin was given. The mid circumflex/obtuse marginal bifurcation is treated. There is complex bifurcation stenosis present. I planned on using a bifurcation stenting technique. Angiomax is used for anticoagulation. The patient is adequately preloaded with aspirin and Plavix. A 7 French XB 3.5 cm guide catheter was utilized. Once a therapeutic ACT was achieved, a cougar guidewire is advanced into the OM. A second cougar guidewire is advanced into the distal circumflex. The obtuse marginal branches predilated with a 2.5 x 20 mm noncompliant balloon. The circumflex is then predilated with a 3.0 mm balloon. With the balloon present in the circumflex, a 2.5 x 20 mm Promus DES his advanced into the OM branch. The stent is deployed so that the ostium is covered. Simultaneously, the circumflex balloon is dilated in order to keep the circumflex portion patent. The circumflex stent is then advanced so that it lays across the OM origin. A 3.5 x 12 mm Promus DES was chosen. This stent is deployed at 14 atm. I tried to rewire the OM branch but  was unsuccessful. I decided to post dilate the circumflex stent in order to open the cells better. A 4.0 mm noncompliant balloon is chosen. It is dilated to 16 atm. I was then able to wire the obtuse marginal branch with a whisper wire. The OM stent is postdilated with a 2.5 mm noncompliant balloon. A final kissing  balloon is done with the 4.0 mm balloon and the circumflex and the 2.5 mm balloon in the obtuse marginal. At the completion of the procedure there is 0% residual stenosis in the circumflex and 20% residual stenosis at the obtuse marginal origin. There is TIMI-3 flow into both vessels.  . There is no residual stenosis post intervention.     . Second Obtuse Marginal Branch   . Ost 2nd Mrg to 2nd Mrg lesion, 70% stenosed. calcified diffuse.   . Angioplasty: Pre-stent angioplasty was performed. A drug-eluting stent was placed. A post-stent angioplasty was not performed. The pre-interventional distal flow is normal (TIMI 3). The post-interventional distal flow is normal (TIMI 3). The intervention was successful. No complications occured at this lesion. IC nitroglycerin was given. See description under LCx lesion, bifurcation stenting technique utilized  . There is a 20% residual stenosis post intervention.       Right Coronary Artery  Dist RCA filled by collaterals from Dist LAD.   Marland Kitchen Prox RCA lesion, 100% stenosed. calcified diffuse          Successful bifurcation stenting of the left circumflex/obtuse marginal bifurcation using drug-eluting stent platforms 3.5 x 12 mm Promus DES in the CFX, 2.5 x 20 mm Promus DES in the OM   Continue dual antiplatelet therapy with aspirin and Plavix for a minimum of 12 months. Would consider long-term dual antiplatelet therapy if tolerated.  EKG: 07/20/2014 Sinus rhythm, no acute ischemic changes  FOLLOW UP PLANS AND APPOINTMENTS Allergies  Allergen Reactions  . Codeine   . Iodine   . Neosporin [Neomycin-Bacitracin Zn-Polymyx]   . Novolog [Insulin Aspart] Other (See Comments)    "breathing problems"  . Penicillins   . Tape      Medication List    STOP taking these medications        omeprazole 20 MG capsule  Commonly known as:  PRILOSEC     predniSONE 20 MG tablet  Commonly known as:  DELTASONE      TAKE these  medications        aspirin EC 81 MG tablet  Take 1 tablet (81 mg total) by mouth daily.     atenolol-chlorthalidone 50-25 MG tablet  Commonly known as:  TENORETIC  TAKE ONE TABLET BY MOUTH ONCE DAILY.     buPROPion 150 MG 12 hr tablet  Commonly known as:  WELLBUTRIN SR  TAKE ONE TABLET BY MOUTH TWICE DAILY     clopidogrel 75 MG tablet  Commonly known as:  PLAVIX  Take 1 tablet (75 mg total) by mouth daily.     cyclobenzaprine 10 MG tablet  Commonly known as:  FLEXERIL  TAKE ONE TABLET BY MOUTH AT BEDTIME     DOXYCYCLINE PO  Take by mouth.     FLUoxetine 20 MG capsule  Commonly known as:  PROZAC  TAKE ONE CAPSULE BY MOUTH ONCE DAILY     gabapentin 300 MG capsule  Commonly known as:  NEURONTIN  Three capsules at lunchtime; take three capsules at bedtime     Insulin Detemir 100 UNIT/ML Pen  Commonly known as:  LEVEMIR  Inject 20-25 Units into the  skin daily at 10 pm.     insulin lispro 100 UNIT/ML injection  Commonly known as:  HUMALOG  Inject 0.05-0.1 mLs (5-10 Units total) into the skin 3 (three) times daily with meals.     Insulin Pen Needle 32G X 6 MM Misc  Use as directed     ipratropium 0.03 % nasal spray  Commonly known as:  ATROVENT  Place 2 sprays into both nostrils 2 (two) times daily.     metFORMIN 1000 MG tablet  Commonly known as:  GLUCOPHAGE  TAKE ONE TABLET BY MOUTH TWICE DAILY WITH MEALS  Notes to Patient:  HOLD for 48 hours. Resume on 03/10/2015.     nitroGLYCERIN 0.4 MG SL tablet  Commonly known as:  NITROSTAT  Place 1 tablet (0.4 mg total) under the tongue every 5 (five) minutes as needed for chest pain.     pantoprazole 40 MG tablet  Commonly known as:  PROTONIX  Take 1 tablet (40 mg total) by mouth daily.     PROVENTIL HFA 108 (90 BASE) MCG/ACT inhaler  Generic drug:  albuterol  Inhale into the lungs every 6 (six) hours as needed for wheezing or shortness of breath.     traMADol 50 MG tablet  Commonly known as:  ULTRAM  Take 1 tablet  by mouth in the afternoon and 2 tablets at bedtime.     triamcinolone cream 0.1 %  Commonly known as:  KENALOG  Apply 1 application topically 2 (two) times daily. May mix 1:1 with eurerin cream at home        Discharge Instructions    Amb Referral to Cardiac Rehabilitation    Complete by:  As directed   Diagnosis:  PCI     Diet - low sodium heart healthy    Complete by:  As directed      Increase activity slowly    Complete by:  As directed           Follow-up Information    Follow up with Richardson Dopp, PA-C On 03/13/2015.   Specialties:  Physician Assistant, Radiology, Interventional Cardiology   Why:  See Provider at 10:10 am, please arrive 15 minutes early for paperwork.    Contact information:   [address] [place] [address] [phone]       Follow up with Dorris Carnes, MD.   Specialty:  Cardiology   Why:  As scheduled.   Contact information:   [place] [address] [phone]       BRING ALL MEDICATIONS WITH YOU TO FOLLOW UP APPOINTMENTS  Time spent with patient to include physician time: 44 min Signed: Rosaria Ferries, PA-C 2020/06/1014, 4:24 PM Co-Sign MD

## 2015-03-07 NOTE — Discharge Instructions (Signed)
PLEASE REMEMBER TO BRING ALL OF YOUR MEDICATIONS TO EACH OF YOUR FOLLOW-UP OFFICE VISITS. ? ?PLEASE ATTEND ALL SCHEDULED FOLLOW-UP APPOINTMENTS.  ? ?Activity: Increase activity slowly as tolerated. You may shower, but no soaking baths (or swimming) for 1 week. No driving for 2 days. No lifting over 5 lbs for 1 week. No sexual activity for 1 week.  ? ?You May Return to Work: in 1 week (if applicable) ? ?Wound Care: You may wash cath site gently with soap and water. Keep cath site clean and dry. If you notice pain, swelling, bleeding or pus at your cath site, please call [phone]. ? ? ? ?Cardiac Cath Site Care ?Refer to this sheet in the next few weeks. These instructions provide you with information on caring for yourself after your procedure. Your caregiver may also give you more specific instructions. Your treatment has been planned according to current medical practices, but problems sometimes occur. Call your caregiver if you have any problems or questions after your procedure. ?HOME CARE INSTRUCTIONS ?You may shower 24 hours after the procedure. Remove the bandage (dressing) and gently wash the site with plain soap and water. Gently pat the site dry.  ?Do not apply powder or lotion to the site.  ?Do not sit in a bathtub, swimming pool, or whirlpool for 5 to 7 days.  ?No bending, squatting, or lifting anything over 10 pounds (4.5 kg) as directed by your caregiver.  ?Inspect the site at least twice daily.  ?Do not drive home if you are discharged the same day of the procedure. Have someone else drive you.  ?You may drive 24 hours after the procedure unless otherwise instructed by your caregiver.  ?What to expect: ?Any bruising will usually fade within 1 to 2 weeks.  ?Blood that collects in the tissue (hematoma) may be painful to the touch. It should usually decrease in size and tenderness within 1 to 2 weeks.  ?SEEK IMMEDIATE MEDICAL CARE IF: ?You have unusual pain at the site or down the affected limb.  ?You  have redness, warmth, swelling, or pain at the site.  ?You have drainage (other than a small amount of blood on the dressing).  ?You have chills.  ?You have a fever or persistent symptoms for more than 72 hours.  ?You have a fever and your symptoms suddenly get worse.  ?Your leg becomes pale, cool, tingly, or numb.  ?You have heavy bleeding from the site. Hold pressure on the site.  ?Document Released: 05/29/2010 Document Revised: 04/15/2011 Document Reviewed: 05/29/2010 ?ExitCare? Patient Information ?2012 [employer]. ? ?

## 2015-03-07 NOTE — Progress Notes (Deleted)
Patient mildly orthostatic blood pressure; asymptomatic.

## 2015-03-07 NOTE — Progress Notes (Signed)
Patient mildly orthostatic BP; asymptomatic at time this was done.  Patient does report long standing dizziness with falls at home.

## 2015-03-07 NOTE — Progress Notes (Signed)
Vascular Ultrasound Preliminary.   Right groin arterial duplex evaluation completed. No evidence of pseudoaneurysm right groin.  Landry Mellow, RDMS, RVT 07/05/2014

## 2015-03-07 NOTE — Progress Notes (Signed)
CARDIAC REHAB PHASE I   Pt ambulated with nurse tech, tolerated well.  Pt c/o of mild dizziness, states this is not new, states she has fallen several times at home due to dizziness, RN aware, states NT to obtain orthostatics. Completed PCI/stent education.  Reviewed risk factors, tobacco cessation (pt states she quit smoking three weeks ago, this is the 7th or 8th time she has quit), anti-platelet therapy, stent card, activity restrictions, ntg, exercise, heart healthy diet, carb counting, and phase 2 cardiac rehab. Pt verbalized understanding, very receptive to education. Pt agrees to phase 2 cardiac rehab referral, will send to [facility]. Pt to recliner after walk, call bell within reach.  [phone]  Lenna Sciara, RN, BSN 2020/05/1714 10:31 AM

## 2015-03-10 ENCOUNTER — Telehealth: Payer: Self-pay | Admitting: Cardiology

## 2015-03-10 NOTE — Telephone Encounter (Signed)
New message      Pt stents put in last thurs.  When can pt return to work?  Also, she needs a note to return to work.   Patient has 2 appointments for hosp follow up. Will she need to keep both appts?

## 2015-03-10 NOTE — Telephone Encounter (Signed)
Instructed patient to keep [facility] this Thursday for return to work instructions. Informed patient Nicki Reaper will evaluate her and will reschedule appointment with Dr. Radford Pax (scheduled 11/10) as necessary.  Patient agrees with treatment plan.

## 2015-03-11 NOTE — Telephone Encounter (Signed)
We can discuss date to RTW at FU office visit and give a note at that time. Richardson Dopp, PA-C   03/11/2015 4:54 PM

## 2015-03-12 NOTE — Progress Notes (Signed)
Cardiology Office Note   Date:  03/13/2015   ID:  Judith [name], DOB 1951/01/04, MRN [id number]  PCP:  Reginia Forts, MD  Cardiologist:  Dr. Fransico Him   Electrophysiologist:  n/a   Chief Complaint  Patient presents with  . Follow-up    s/p PCI  . Coronary Artery Disease     History of Present Illness: Judith [name] is a 64 y.o. female with a hx of diabetes, HTN, tobacco abuse.  She was evaluated by Dr. Radford Pax in 4/15 for chest pain.  Echo demonstrated normal LVF.  Nuclear stress test was cancelled when her symptoms improved with PPI therapy.  Last seen by Dr. Fransico Him 4/15.  Admitted 10/5-10/6 with chest pain in the setting of UTI and viral gastroenteritis complicated by AKI and hypoNa.  She was evaluated by Cardiology.  CEs remained neg.  Echo demonstrated mod LVH, EF 60-65%, no RWMA, Gr 1 DD, MAC.  OP stress test was recommended.  This was done 02/21/15 and demonstrated EF 60% with inf-lat and apical lateral ischemia (intermediate risk).    We set her up for cardiac cath.  This demonstrated 2 v CAD with CTO of the RCA and high grade bifurcational LCx/OM stenosis. She was brought back for PCI and underwent DES x 2 to the LCx/OM.  She had a bruit post PCI and [place] was neg for pseudoaneurysm.    Returns for FU.  She has noted some anxiety since her PCI.  She denies chest pain or significant DOE. She notes SOB with more extreme exertion.  She denies orthopnea, PND, edema.  She denies syncope.  She denies any bleeding issues.  She has chronic dizziness that is described as spinning.  Also notes issues with balance and has had some falls.     Studies/Reports Reviewed Today:  PCI 03/06/15 Bifurcational stenting of the LCx/OM with 2.5 x 20 mm and 3.5 x 12 mm Promus DES  LHC 02/27/15 LAD proximal 40% LCx mid 80%, OM to 70% RCA proximal 100%, L-R collaterals  Myoview 02/21/15 EF 60%, inferolateral and apical lateral ischemia, intermediate risk  Echo 02/13/15 Moderate LVH, EF  60-65%, normal wall motion, grade 1 diastolic dysfunction, MAC    Past Medical History  Diagnosis Date  . Hypertension   . Goiter   . Chronic kidney disease   . History of shingles 06/01/2013  . Abnormal EKG 07/31/2013  . GERD (gastroesophageal reflux disease)   . Carpal tunnel syndrome, bilateral   . Coronary artery disease   . Hyperlipidemia   . Anginal pain ([place])   . Asthma   . Pneumonia ~ 1976  . Type II diabetes mellitus ([facility])     insulin dependent  . Arthritis     "hands" (03/06/2015)  . Diabetic peripheral neuropathy ([place]) "since 1996"    Past Surgical History  Procedure Laterality Date  . Tonsillectomy  1976  . Cesarean section  1982; 1984  . Abdominal hysterectomy  1988  . Carpal tunnel release Right Nov 2015  . Knee arthroscopy Right ~ 2003    "meniscus repair"  . Shoulder open rotator cuff repair Right 1996; 1998    "w/fracture repair"  . Foot neuroma surgery Bilateral 2000  . Carpal tunnel release Right 1992; 05/2014    [name]; [place]  . Thyroid surgery  2000    "removed lots of nodules"  . Cardiac catheterization N/A 02/27/2015    Procedure: Left Heart Cath and Coronary Angiography;  Surgeon: [name], MD; LAD 40%, mCFX 80%,  OM 70%, RCA 100% calcified       . Cardiac catheterization N/A 03/06/2015    Procedure: Coronary Stent Intervention;  Surgeon: [name], MD;  Location: [facility];  Service: Cardiovascular;  Laterality: N/A;  Mid CX 3.50x12 promus DES w/ 0% resdual and Prox OM1 2.50x20 promus DES w/ 20% residual     Current Outpatient Prescriptions  Medication Sig Dispense Refill  . albuterol (PROVENTIL HFA) 108 (90 BASE) MCG/ACT inhaler Inhale into the lungs every 6 (six) hours as needed for wheezing or shortness of breath.    Marland Kitchen aspirin EC 81 MG tablet Take 1 tablet (81 mg total) by mouth daily.    Marland Kitchen buPROPion (WELLBUTRIN SR) 150 MG 12 hr tablet TAKE ONE TABLET BY MOUTH TWICE DAILY 60 tablet 5  . clopidogrel (PLAVIX) 75 MG  tablet Take 1 tablet (75 mg total) by mouth daily. 30 tablet 11  . cyclobenzaprine (FLEXERIL) 10 MG tablet TAKE ONE TABLET BY MOUTH AT BEDTIME (Patient taking differently: TAKE ONE TABLET BY MOUTH AT BEDTIME AS NEEDED FOR FOOT PAIN) 30 tablet 1  . FLUoxetine (PROZAC) 20 MG capsule TAKE ONE CAPSULE BY MOUTH ONCE DAILY 30 capsule 5  . gabapentin (NEURONTIN) 300 MG capsule Three capsules at lunchtime; take three capsules at bedtime 180 capsule 5  . Insulin Detemir (LEVEMIR) 100 UNIT/ML Pen Inject 20-25 Units into the skin daily at 10 pm. 15 mL 11  . insulin lispro (HUMALOG) 100 UNIT/ML injection Inject 0.05-0.1 mLs (5-10 Units total) into the skin 3 (three) times daily with meals. 3 mL 0  . Insulin Pen Needle 32G X 6 MM MISC Use as directed 50 each 5  . ipratropium (ATROVENT) 0.03 % nasal spray Place 2 sprays into both nostrils 2 (two) times daily. 30 mL 0  . metFORMIN (GLUCOPHAGE) 1000 MG tablet TAKE ONE TABLET BY MOUTH TWICE DAILY WITH MEALS 60 tablet 5  . nitroGLYCERIN (NITROSTAT) 0.4 MG SL tablet Place 1 tablet (0.4 mg total) under the tongue every 5 (five) minutes as needed for chest pain. 25 tablet 3  . pantoprazole (PROTONIX) 40 MG tablet Take 1 tablet (40 mg total) by mouth daily. 30 tablet 11  . traMADol (ULTRAM) 50 MG tablet Take 1 tablet by mouth in the afternoon and 2 tablets at bedtime. 90 tablet 5  . triamcinolone cream (KENALOG) 0.1 % Apply 1 application topically 2 (two) times daily. May mix 1:1 with eurerin cream at home 454 g 0  . atorvastatin (LIPITOR) 40 MG tablet Take 1 tablet (40 mg total) by mouth daily. 30 tablet 11  . losartan (COZAAR) 50 MG tablet Take 1 tablet (50 mg total) by mouth daily. 30 tablet 11  . metoprolol succinate (TOPROL-XL) 50 MG 24 hr tablet Take 1 tablet (50 mg total) by mouth daily. Take with or immediately following a meal. 30 tablet 11   No current facility-administered medications for this visit.    Allergies:   Ace inhibitors; Codeine; Iodine;  Neosporin; Novolog; Penicillins; and Tape    Social History:  Social History   Social History  . Marital Status: Divorced    Spouse Name: N/A  . Number of Children: N/A  . Years of Education: N/A   Occupational History  . Food Therapist, nutritional    Social History Main Topics  . Smoking status: Current Every Day Smoker -- 0.50 packs/day for 41 years    Types: Cigarettes  . Smokeless tobacco: Never Used     Comment: 03/06/2015 "quit smoking  cigarettes ~ 1 wk ago"  . Alcohol Use: No  . Drug Use: No  . Sexual Activity: Not Currently   Other Topics Concern  . None   Social History Narrative   Lives with sister in [place].   Divorced twice.    Family History:  The patient's family history includes Allergies in her sister; Breast cancer in her maternal grandmother; COPD in her father; Cancer in her mother; Emphysema in her maternal grandfather and paternal grandfather; Heart attack in her father; Heart disease in her father; Hypertension in her father; Leukemia in her paternal grandmother.    ROS:   Please see the history of present illness.   Review of Systems  Constitution: Positive for decreased appetite.  Neurological: Positive for dizziness and loss of balance.  Psychiatric/Behavioral: Positive for depression. The patient is nervous/anxious.   All other systems reviewed and are negative.     PHYSICAL EXAM: VS:  BP 140/78 mmHg  Pulse 76  Ht 5\' 4"  (1.626 m)  Wt 168 lb 1.9 oz (76.259 kg)  BMI 28.84 kg/m2    Wt Readings from Last 3 Encounters:  03/13/15 168 lb 1.9 oz (76.259 kg)  03/07/15 180 lb 1.9 oz (81.7 kg)  02/27/15 166 lb (75.297 kg)     GEN: Well nourished, well developed, in no acute distress HEENT: normal Neck: no JVD,   no masses Cardiac:  Normal S1/S2, RRR; no murmur,  no rubs or gallops, no edema; right wrist without hematoma or mass; R groin without hematoma or bruit    Respiratory:  Decreased breath sounds bilaterally, no wheezing,  rhonchi or rales. GI: soft, nontender, nondistended, + BS MS: no deformity or atrophy Skin: warm and dry  Neuro:  CNs II-XII intact, Strength and sensation are intact Psych: Normal affect   EKG:  EKG is ordered today.  It demonstrates:   NSR, HR 76, anteroseptal Q waves, QTc 441 ms, no changes   Recent Labs: 02/12/2015: TSH 0.704 02/13/2015: ALT 11* 02-29-202016: BUN 23*; Creatinine, Ser 0.69; Hemoglobin 11.4*; Platelets 309; Potassium 3.8; Sodium 139    Lipid Panel    Component Value Date/Time   CHOL 236* 12/06/2014 1415   TRIG 993* 12/06/2014 1415   HDL 29* 12/06/2014 1415   CHOLHDL 8.1* 12/06/2014 1415   VLDL NOT CALC 12/06/2014 1415   LDLCALC NOT CALC 12/06/2014 1415      ASSESSMENT AND PLAN:  1. CAD:  She is s/p recent LHC with 2 v CAD with CTO of the RCA and high grade LCx/OM bifurcational disease.  LCx/OM was treated with PCI with DES x 2.  She is doing well without angina.  We discussed the importance of dual antiplatelet therapy. She will need to remain on ASA + Plavix for at least one year. May need to consider longer duration for DAP therapy given DM and known CTO of the RCA.  Continue beta-blocker but will change Tenoretic to Toprol-XL 50 mg QD.  Given CAD and DM, she would benefit from ACE inhibitor.  However, she has a hx of ACE cough.  Will start Losartan 50 mg QD. Check BMET 1 week.  Add Atorvastatin 40 mg QD.  Check Lipids and LFTs in 6 weeks.  She plans to start cardiac rehab. I have given her a note to return to work next week.    2. HTN:  Borderline control.  She has a lot of dizziness which sounds vertiginous in nature.  DC summary indicates she had mild BP drop with  standing.  She may feel better off of thiazide diuretic.    -  DC Tenoretic  -  Start Toprol-XL 50 mg QD  -  Start Losartan 50 mg QD >> FU BMET 1 week.   3. Diabetes Mellitus:  FU with PCP.  4. Tobacco Abuse:  She has quit smoking.  5. Hyperlipidemia:  Trigs in 7/16 were over 900.   Given CAD,  she needs statin Rx.  Start Atorvastatin 40 mg QD.  Check Lipids and LFTs in 6 weeks.       Medication Changes: Current medicines are reviewed at length with the patient today.  Concerns regarding medicines are as outlined above.  The following changes have been made:   Discontinued Medications   ATENOLOL-CHLORTHALIDONE (TENORETIC) 50-25 MG PER TABLET    TAKE ONE TABLET BY MOUTH ONCE DAILY.   DOXYCYCLINE PO    Take by mouth.   Modified Medications   No medications on file   New Prescriptions   ATORVASTATIN (LIPITOR) 40 MG TABLET    Take 1 tablet (40 mg total) by mouth daily.   LOSARTAN (COZAAR) 50 MG TABLET    Take 1 tablet (50 mg total) by mouth daily.   METOPROLOL SUCCINATE (TOPROL-XL) 50 MG 24 HR TABLET    Take 1 tablet (50 mg total) by mouth daily. Take with or immediately following a meal.   Labs/ tests ordered today include:   Orders Placed This Encounter  Procedures  . Basic Metabolic Panel (BMET)  . Lipid Profile  . Hepatic function panel  . Basic Metabolic Panel (BMET)  . EKG 12-Lead      Disposition:    FU with Dr. Tressia Miners  2 mos   Signed, Richardson Dopp, PA-C, MHS 03/13/2015 1:14 PM    [facility] [address], [place],   [address] Phone: [phone]; Fax: [phone]

## 2015-03-13 ENCOUNTER — Ambulatory Visit (INDEPENDENT_AMBULATORY_CARE_PROVIDER_SITE_OTHER): Payer: Commercial Managed Care - HMO | Admitting: Physician Assistant

## 2015-03-13 ENCOUNTER — Encounter: Payer: Self-pay | Admitting: Physician Assistant

## 2015-03-13 VITALS — BP 140/78 | HR 76 | Ht 64.0 in | Wt 168.1 lb

## 2015-03-13 DIAGNOSIS — E785 Hyperlipidemia, unspecified: Secondary | ICD-10-CM

## 2015-03-13 DIAGNOSIS — I2510 Atherosclerotic heart disease of native coronary artery without angina pectoris: Secondary | ICD-10-CM | POA: Diagnosis not present

## 2015-03-13 DIAGNOSIS — R079 Chest pain, unspecified: Secondary | ICD-10-CM | POA: Diagnosis not present

## 2015-03-13 DIAGNOSIS — I10 Essential (primary) hypertension: Secondary | ICD-10-CM

## 2015-03-13 MED ORDER — METOPROLOL SUCCINATE ER 50 MG PO TB24: 50.0000 mg | ORAL_TABLET | Freq: Every day | ORAL | Status: DC

## 2015-03-13 MED ORDER — LOSARTAN POTASSIUM 50 MG PO TABS: 50.0000 mg | ORAL_TABLET | Freq: Every day | ORAL | Status: DC

## 2015-03-13 MED ORDER — ATORVASTATIN CALCIUM 40 MG PO TABS: 40.0000 mg | ORAL_TABLET | Freq: Every day | ORAL | Status: DC

## 2015-03-13 NOTE — Patient Instructions (Signed)
Medication Instructions:  1. STOP TENORETIC 2. START ATORVASTATIN 40 MG DAILY 3. START TOPROL XL 50 MG DAILY 4. START LOSARTAN 50 MG DAILY  Labwork: 1. BMET IN 1 WEEK  2. IN 6 WEEKS NEED FASTING LIPID AND LIVER PANEL, BMET  Testing/Procedures: NONE  Follow-Up: CANCEL DR. Radford Pax 11/16 APPT AND RESCHEDULE FOR 2 MONTHS WITH DR. Radford Pax  Any Other Special Instructions Will Be Listed Below (If Applicable).     If you need a refill on your cardiac medications before your next appointment, please call your pharmacy.

## 2015-03-14 ENCOUNTER — Ambulatory Visit: Payer: Commercial Managed Care - HMO | Admitting: Family Medicine

## 2015-03-17 ENCOUNTER — Ambulatory Visit: Payer: Commercial Managed Care - HMO | Admitting: Family Medicine

## 2015-03-20 ENCOUNTER — Other Ambulatory Visit: Payer: Commercial Managed Care - HMO

## 2015-03-20 ENCOUNTER — Encounter: Payer: Commercial Managed Care - HMO | Admitting: Cardiology

## 2015-03-20 NOTE — Telephone Encounter (Signed)
Pt seen recently

## 2015-03-26 ENCOUNTER — Encounter: Payer: Self-pay | Admitting: Family Medicine

## 2015-03-28 ENCOUNTER — Ambulatory Visit (INDEPENDENT_AMBULATORY_CARE_PROVIDER_SITE_OTHER): Payer: Commercial Managed Care - HMO | Admitting: Family Medicine

## 2015-03-28 ENCOUNTER — Encounter: Payer: Self-pay | Admitting: Family Medicine

## 2015-03-28 VITALS — BP 124/73 | HR 90 | Temp 99.8°F | Resp 16 | Ht 64.0 in | Wt 171.6 lb

## 2015-03-28 DIAGNOSIS — E1140 Type 2 diabetes mellitus with diabetic neuropathy, unspecified: Secondary | ICD-10-CM

## 2015-03-28 DIAGNOSIS — K219 Gastro-esophageal reflux disease without esophagitis: Secondary | ICD-10-CM | POA: Diagnosis not present

## 2015-03-28 DIAGNOSIS — Z1211 Encounter for screening for malignant neoplasm of colon: Secondary | ICD-10-CM

## 2015-03-28 DIAGNOSIS — R195 Other fecal abnormalities: Secondary | ICD-10-CM

## 2015-03-28 DIAGNOSIS — Z87410 Personal history of cervical dysplasia: Secondary | ICD-10-CM | POA: Diagnosis not present

## 2015-03-28 DIAGNOSIS — I10 Essential (primary) hypertension: Secondary | ICD-10-CM

## 2015-03-28 DIAGNOSIS — I2510 Atherosclerotic heart disease of native coronary artery without angina pectoris: Secondary | ICD-10-CM | POA: Diagnosis not present

## 2015-03-28 DIAGNOSIS — F418 Other specified anxiety disorders: Secondary | ICD-10-CM | POA: Diagnosis not present

## 2015-03-28 DIAGNOSIS — Z0000 Encounter for general adult medical examination without abnormal findings: Secondary | ICD-10-CM

## 2015-03-28 DIAGNOSIS — F419 Anxiety disorder, unspecified: Secondary | ICD-10-CM

## 2015-03-28 DIAGNOSIS — F329 Major depressive disorder, single episode, unspecified: Secondary | ICD-10-CM

## 2015-03-28 MED ORDER — GLIPIZIDE ER 5 MG PO TB24: 5.0000 mg | ORAL_TABLET | Freq: Every day | ORAL | Status: DC

## 2015-03-28 NOTE — Patient Instructions (Signed)
Keeping You Healthy  Get These Tests  Blood Pressure- Have your blood pressure checked by your healthcare provider at least once a year.  Normal blood pressure is 120/80.  Weight- Have your body mass index (BMI) calculated to screen for obesity.  BMI is a measure of body fat based on height and weight.  You can calculate your own BMI at www.nhlbisupport.com/bmi/  Cholesterol- Have your cholesterol checked every year.  Diabetes- Have your blood sugar checked every year if you have high blood pressure, high cholesterol, a family history of diabetes or if you are overweight.  Pap Test - Have a pap test every 1 to 5 years if you have been sexually active.  If you are older than 65 and recent pap tests have been normal you may not need additional pap tests.  In addition, if you have had a hysterectomy  for benign disease additional pap tests are not necessary.  Mammogram-Yearly mammograms are essential for early detection of breast cancer  Screening for Colon Cancer- Colonoscopy starting at age 50. Screening may begin sooner depending on your family history and other health conditions.  Follow up colonoscopy as directed by your Gastroenterologist.  Screening for Osteoporosis- Screening begins at age 62 with bone density scanning, sooner if you are at higher risk for developing Osteoporosis.  Get these medicines  Calcium with Vitamin D- Your body requires 1200-1500 mg of Calcium a day and 800-1000 IU of Vitamin D a day.  You can only absorb 500 mg of Calcium at a time therefore Calcium must be taken in 2 or 3 separate doses throughout the day.  Hormones- Hormone therapy has been associated with increased risk for certain cancers and heart disease.  Talk to your healthcare provider about if you need relief from menopausal symptoms.  Aspirin- Ask your healthcare provider about taking Aspirin to prevent Heart Disease and Stroke.  Get these Immuniztions  Flu shot- Every fall  Pneumonia shot-  Once after the age of 62; if you are younger ask your healthcare provider if you need a pneumonia shot.  Tetanus- Every ten years.  Zostavax- Once after the age of 62 to prevent shingles.  Take these steps  Don't smoke- Your healthcare provider can help you quit. For tips on how to quit, ask your healthcare provider or go to www.smokefree.gov or call 1-800 QUIT-NOW.  Be physically active- Exercise 5 days a week for a minimum of 30 minutes.  If you are not already physically active, start slow and gradually work up to 30 minutes of moderate physical activity.  Try walking, dancing, bike riding, swimming, etc.  Eat a healthy diet- Eat a variety of healthy foods such as fruits, vegetables, whole grains, low fat milk, low fat cheeses, yogurt, lean meats, chicken, fish, eggs, dried beans, tofu, etc.  For more information go to www.thenutritionsource.org  Dental visit- Brush and floss teeth twice daily; visit your dentist twice a year.  Eye exam- Visit your Optometrist or Ophthalmologist yearly.  Drink alcohol in moderation- Limit alcohol intake to one drink or less a day.  Never drink and drive.  Depression- Your emotional health is as important as your physical health.  If you're feeling down or losing interest in things you normally enjoy, please talk to your healthcare provider.  Seat Belts- can save your life; always wear one  Smoke/Carbon Monoxide detectors- These detectors need to be installed on the appropriate level of your home.  Replace batteries at least once a year.  Violence- If   anyone is threatening or hurting you, please tell your healthcare provider.  Living Will/ Health care power of attorney- Discuss with your healthcare provider and family. 

## 2015-03-28 NOTE — Progress Notes (Signed)
Subjective:    Patient ID: Judith [name], female    DOB: 09-Jun-1950, 64 y.o.   MRN: [id number]  03/28/2015  Annual Exam and Medication Refill   HPI This 64 y.o. female presents for Complete Physical Examination and Complete Physical Examination.  Last physical:  06-01-13 Pap smear:  Years ago; hysterectomy; requesting pap smear.   Mammogram:  01-06-15 Colonoscopy:  Last colonoscopy 4 years in [place] at [facility]; no polyps; two previous colonoscopies.   Bone density:  11-29-14 WNL TDAP:  2014 Pneumovax:  04/02/2014 Zostavax: never; allergic to something in vaccine. Influenza:  02-13-15 Eye exam:  +glasses; 3 months ago; [place] [facility]. Dental exam:  Every six months.  CAD: s/p PTCA; Plavix and ASA and Lipitor.   Onset of GI bug; vomited for two days; drove to work; felt horrible; called sister who took pt to ED.  Had acute renal insufficinecy, worried about AMI, elevated sugars.  Suffered with substernal chest pain.  Performed outpatient stress test that was abnormal; scheduled cardiac cath which was abnormal; then scheduled for intervention;.    DMII: HgbA1c 10.4 on 02/12/15.   TSH 02/11/15 WNL. Urine mirocalbumin 22 on 7/22 visit.  Metformin 1000mg  bid. Humolog 5-10 units tid SSI only if > 200. Levemir 28 units qhs.     Checking sugars bedtime.   Neuropathy: stopped cymbalta years ago; increased Neurontin to 3 at lunch and three at bedtime.  Tramadol two qhs. S/p NCS from [place] for CTS; no improvement on L arm; R CTS has improved.  Started wearing braces for CTS again.     Follow-up with ortho.  Depression and anxiety:  Wellbutrin 150mg  bid, Prozac 20mg  daily.  Doing well emotionally; was really depressed with recent cardiac diagnoses but has improved.   HTN: Cozaar 50mg  daily.  Metoprolol 50mg  daily.  Recent change in medications per cardiology. Diarrhea has resolved with change in medications.    GERD: protonix.   Dizziness: ongoing for a while; suffers  with changes in position; also suffers with dizziness with ambulation.  Continues to have some dizziness; less sevre dizziness.   Asthma: Albuterol PRN. Seasonal.    Hyperlipidemia: started on Atorvastatin 40mg  daily.    Review of Systems  Constitutional: Negative for fever, chills, diaphoresis, activity change, appetite change, fatigue and unexpected weight change.  HENT: Negative for congestion, dental problem, drooling, ear discharge, ear pain, facial swelling, hearing loss, mouth sores, nosebleeds, postnasal drip, rhinorrhea, sinus pressure, sneezing, sore throat, tinnitus, trouble swallowing and voice change.   Eyes: Negative for photophobia, pain, discharge, redness, itching and visual disturbance.  Respiratory: Negative for apnea, cough, choking, chest tightness, shortness of breath, wheezing and stridor.   Cardiovascular: Negative for chest pain, palpitations and leg swelling.  Gastrointestinal: Negative for nausea, vomiting, abdominal pain, diarrhea, constipation, blood in stool, abdominal distention, anal bleeding and rectal pain.  Endocrine: Negative for cold intolerance, heat intolerance, polydipsia, polyphagia and polyuria.  Genitourinary: Negative for dysuria, urgency, frequency, hematuria, flank pain, decreased urine volume, vaginal bleeding, vaginal discharge, enuresis, difficulty urinating, genital sores, vaginal pain, menstrual problem, pelvic pain and dyspareunia.  Musculoskeletal: Positive for back pain. Negative for myalgias, joint swelling, arthralgias, gait problem, neck pain and neck stiffness.  Skin: Negative for color change, pallor, rash and wound.  Allergic/Immunologic: Negative for environmental allergies, food allergies and immunocompromised state.  Neurological: Positive for dizziness and numbness. Negative for tremors, seizures, syncope, facial asymmetry, speech difficulty, weakness, light-headedness and headaches.  Hematological: Negative for adenopathy. Does not  bruise/bleed easily.  Psychiatric/Behavioral: Positive for dysphoric mood. Negative for suicidal ideas, hallucinations, behavioral problems, confusion, sleep disturbance, self-injury, decreased concentration and agitation. The patient is nervous/anxious. The patient is not hyperactive.     Past Medical History  Diagnosis Date  . Hypertension   . Goiter   . Chronic kidney disease   . History of shingles 06/01/2013  . Abnormal EKG 07/31/2013  . GERD (gastroesophageal reflux disease)   . Carpal tunnel syndrome, bilateral   . Coronary artery disease   . Hyperlipidemia   . Anginal pain ([place])   . Asthma   . Pneumonia ~ 1976  . Type II diabetes mellitus (HCC)     insulin dependent  . Arthritis     "hands" (03/06/2015)  . Diabetic peripheral neuropathy ([place]) "since 1996"   Past Surgical History  Procedure Laterality Date  . Tonsillectomy  1976  . Cesarean section  1982; 1984  . Carpal tunnel release Right Nov 2015  . Knee arthroscopy Right ~ 2003    "meniscus repair"  . Shoulder open rotator cuff repair Right 1996; 1998    "w/fracture repair"  . Foot neuroma surgery Bilateral 2000  . Carpal tunnel release Right 1992; 05/2014    [name];   . Thyroid surgery  2000    "removed lots of nodules"  . Cardiac catheterization N/A 02/27/2015    Procedure: Left Heart Cath and Coronary Angiography;  Surgeon: [name], MD; LAD 40%, mCFX 80%, OM 70%, RCA 100% calcified       . Cardiac catheterization N/A 03/06/2015    Procedure: Coronary Stent Intervention;  Surgeon: [name], MD;  Location: [facility];  Service: Cardiovascular;  Laterality: N/A;  Mid CX 3.50x12 promus DES w/ 0% resdual and Prox OM1 2.50x20 promus DES w/ 20% residual  . Abdominal hysterectomy  1988    age 25; CERVICAL DYSPLASIA; ovaries intact.    Allergies  Allergen Reactions  . Ace Inhibitors Cough  . Codeine   . Iodine   . Neosporin [Neomycin-Bacitracin Zn-Polymyx]   . Novolog [Insulin  Aspart] Other (See Comments)    "breathing problems"  . Penicillins   . Tape    Current Outpatient Prescriptions  Medication Sig Dispense Refill  . albuterol (PROVENTIL HFA) 108 (90 BASE) MCG/ACT inhaler Inhale into the lungs every 6 (six) hours as needed for wheezing or shortness of breath.    Marland Kitchen aspirin EC 81 MG tablet Take 1 tablet (81 mg total) by mouth daily.    Marland Kitchen atorvastatin (LIPITOR) 40 MG tablet Take 1 tablet (40 mg total) by mouth daily. 30 tablet 11  . buPROPion (WELLBUTRIN SR) 150 MG 12 hr tablet TAKE ONE TABLET BY MOUTH TWICE DAILY 60 tablet 5  . clopidogrel (PLAVIX) 75 MG tablet Take 1 tablet (75 mg total) by mouth daily. 30 tablet 11  . cyclobenzaprine (FLEXERIL) 10 MG tablet TAKE ONE TABLET BY MOUTH AT BEDTIME (Patient taking differently: TAKE ONE TABLET BY MOUTH AT BEDTIME AS NEEDED FOR FOOT PAIN) 30 tablet 1  . FLUoxetine (PROZAC) 20 MG capsule TAKE ONE CAPSULE BY MOUTH ONCE DAILY 30 capsule 5  . gabapentin (NEURONTIN) 300 MG capsule Three capsules at lunchtime; take three capsules at bedtime 180 capsule 5  . Insulin Detemir (LEVEMIR) 100 UNIT/ML Pen Inject 20-25 Units into the skin daily at 10 pm. 15 mL 11  . insulin lispro (HUMALOG) 100 UNIT/ML injection Inject 0.05-0.1 mLs (5-10 Units total) into the skin 3 (three) times daily with meals. 3 mL 0  .  ipratropium (ATROVENT) 0.03 % nasal spray Place 2 sprays into both nostrils 2 (two) times daily. 30 mL 0  . losartan (COZAAR) 50 MG tablet Take 1 tablet (50 mg total) by mouth daily. 30 tablet 11  . metFORMIN (GLUCOPHAGE) 1000 MG tablet TAKE ONE TABLET BY MOUTH TWICE DAILY WITH MEALS 60 tablet 5  . metoprolol succinate (TOPROL-XL) 50 MG 24 hr tablet Take 1 tablet (50 mg total) by mouth daily. Take with or immediately following a meal. 30 tablet 11  . nitroGLYCERIN (NITROSTAT) 0.4 MG SL tablet Place 1 tablet (0.4 mg total) under the tongue every 5 (five) minutes as needed for chest pain. 25 tablet 3  . pantoprazole (PROTONIX) 40 MG  tablet Take 1 tablet (40 mg total) by mouth daily. 30 tablet 11  . traMADol (ULTRAM) 50 MG tablet Take 1 tablet by mouth in the afternoon and 2 tablets at bedtime. 90 tablet 5  . triamcinolone cream (KENALOG) 0.1 % Apply 1 application topically 2 (two) times daily. May mix 1:1 with eurerin cream at home 454 g 0  . glipiZIDE (GLUCOTROL XL) 5 MG 24 hr tablet Take 1 tablet (5 mg total) by mouth daily with lunch. 90 tablet 1  . Insulin Pen Needle 32G X 6 MM MISC Use as directed 50 each 5   No current facility-administered medications for this visit.   Social History   Social History  . Marital Status: Divorced    Spouse Name: N/A  . Number of Children: N/A  . Years of Education: N/A   Occupational History  . Food Therapist, nutritional    Social History Main Topics  . Smoking status: Current Every Day Smoker -- 0.50 packs/day for 41 years    Types: Cigarettes  . Smokeless tobacco: Never Used     Comment: 03/06/2015 "quit smoking cigarettes ~ 1 wk ago"  . Alcohol Use: No  . Drug Use: No  . Sexual Activity: Not Currently    Birth Control/ Protection: Post-menopausal, Surgical   Other Topics Concern  . 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      Lives with sister in [place].      Tobacco: 1 ppd x 41 years      Alcohol: none      Drugs: none      Exercise:  Walking in neighborhood; physical job.      Family History  Problem Relation Age of Onset  . Cancer Mother 4    bronchial cancer  . Hypertension Father   . COPD Father   . Heart disease Father 29    CAD with cardiac stenting  . Heart attack Father   . Allergies Sister   . Breast cancer Maternal Grandmother   . Emphysema Maternal Grandfather   . Leukemia Paternal Grandmother   . Emphysema  Paternal Grandfather        Objective:    BP 124/73 mmHg  Pulse 90  Temp(Src) 99.8 F (37.7 C) (Oral)  Resp 16  Ht 5\' 4"  (1.626 m)  Wt 171 lb 9.6 oz (77.837 kg)  BMI 29.44 kg/m2 Physical Exam  Constitutional: She is oriented to person, place, and time. She appears well-developed and well-nourished. No distress.  HENT:  Head: Normocephalic and atraumatic.  Right Ear: External ear normal.  Left Ear: External ear normal.  Nose: Nose normal.  Mouth/Throat: Oropharynx is clear and moist.  Eyes: Conjunctivae and EOM are normal. Pupils are equal, round, and reactive to light.  Neck: Normal range of motion and full passive range of motion without pain. Neck supple. No JVD present. Carotid bruit is not present. No thyromegaly present.  Cardiovascular: Normal rate, regular rhythm and normal heart sounds.  Exam reveals no gallop and no friction rub.   No murmur heard. Pulmonary/Chest: Effort normal and breath sounds normal. She has no wheezes. She has no rales. Right breast exhibits no inverted nipple, no mass, no nipple discharge, no skin change and no tenderness. Left breast exhibits no inverted nipple, no mass, no nipple discharge, no skin change and no tenderness. Breasts are symmetrical.  Abdominal: Soft. Bowel sounds are normal. She exhibits no distension and no mass. There is no tenderness. There is no rebound and no guarding.  Genitourinary: Vagina normal. There is no rash, tenderness or lesion on the right labia. There is no rash, tenderness or lesion on the left labia. Right adnexum displays no mass, no tenderness and no fullness. Left adnexum displays no mass, no tenderness and no fullness.  Musculoskeletal:       Right shoulder: Normal.       Left shoulder: Normal.       Cervical back: Normal.  Lymphadenopathy:    She has no cervical adenopathy.  Neurological: She is alert and oriented to person, place, and time. She has normal reflexes. No cranial nerve deficit. She exhibits  normal muscle tone. Coordination normal.  Skin: Skin is warm and dry. No rash noted. She is not diaphoretic. No erythema. No pallor.  Psychiatric: She has a normal mood and affect. Her behavior is normal. Judgment and thought content normal.  Nursing note and vitals reviewed.       Assessment & Plan:   1. Routine physical examination   2. History of cervical dysplasia   3. Colon cancer screening   4. Essential hypertension   5. Anxiety and depression   6. Gastroesophageal reflux disease without esophagitis   7. Diabetic neuropathy, painful ([place])   8. Depression with anxiety   9. Coronary artery disease involving native coronary artery of native heart without angina pectoris     1. Complete Physical Examination: anticipatory guidance --- exercise, weight loss, 3 servings of dairy daily.  Pap smear obtained due to history of cervical dysplasia as indication for hysterectomy. Mammogram is UTD.  Colonoscopy UTD but insurance requesting hemosure so provided.  Immunizations UTD.   2.  History of cervical dysplasia: s/p hysterectomy; obtained pap smear today. 3.  Colon cancer screening: colonoscopy four years ago by report; hemosure provided. 4.  HTN: controlled; obtain labs; continue current medications. 5.  Anxiety and depression: stable/controlled; continue current medications. 6.  GERD: controlled with Protonix. 7.  DMII with neuropathy: uncontrolled; continue Levemir 28 units; restart Glipizide 5mg  daily. Continue Humolog SSI if sugars>200. Recent increase in Levemir from 18 to 28 units.  Neuropathy much improved with increase in Gabapentin dose; using Tramadol only qhs now. 8.  CAD: New.  S/p cardiac stenting; maintained on Plavix and ASA and Lipitor.  Continue Metoprolol and Cozaar.     Orders Placed This Encounter  Procedures  . CBC with Differential/Platelet  . Comprehensive metabolic panel    Order Specific Question:  Has the patient fasted?    Answer:  Yes  . Lipid panel  Order Specific Question:  Has the patient fasted?    Answer:  Yes  . Vitamin B12  . VITAMIN D 25 Hydroxy (Vit-D Deficiency, Fractures)  . IFOBT POC (occult bld, rslt in office)   Meds ordered this encounter  Medications  . glipiZIDE (GLUCOTROL XL) 5 MG 24 hr tablet    Sig: Take 1 tablet (5 mg total) by mouth daily with lunch.    Dispense:  90 tablet    Refill:  1    Return in about 3 months (around 06/28/2015) for recheck.    Marae Cottrell Elayne Guerin, M.D. [facility] [address] [place], [place]  [address] [phone] phone [phone] fax

## 2015-03-29 ENCOUNTER — Other Ambulatory Visit: Payer: Commercial Managed Care - HMO | Admitting: Family Medicine

## 2015-03-29 DIAGNOSIS — I25118 Atherosclerotic heart disease of native coronary artery with other forms of angina pectoris: Secondary | ICD-10-CM | POA: Insufficient documentation

## 2015-03-29 DIAGNOSIS — Z0000 Encounter for general adult medical examination without abnormal findings: Secondary | ICD-10-CM | POA: Diagnosis not present

## 2015-03-29 LAB — CBC WITH DIFFERENTIAL/PLATELET
BASOS PCT: 1 % (ref 0–1)
Basophils Absolute: 0.1 10*3/uL (ref 0.0–0.1)
EOS ABS: 0.3 10*3/uL (ref 0.0–0.7)
EOS PCT: 6 % — AB (ref 0–5)
HCT: 33.6 % — ABNORMAL LOW (ref 36.0–46.0)
Hemoglobin: 11.2 g/dL — ABNORMAL LOW (ref 12.0–15.0)
LYMPHS PCT: 29 % (ref 12–46)
Lymphs Abs: 1.6 10*3/uL (ref 0.7–4.0)
MCH: 28.7 pg (ref 26.0–34.0)
MCHC: 33.3 g/dL (ref 30.0–36.0)
MCV: 86.2 fL (ref 78.0–100.0)
MONO ABS: 0.6 10*3/uL (ref 0.1–1.0)
MPV: 8.7 fL (ref 8.6–12.4)
Monocytes Relative: 10 % (ref 3–12)
Neutro Abs: 3 10*3/uL (ref 1.7–7.7)
Neutrophils Relative %: 54 % (ref 43–77)
PLATELETS: 336 10*3/uL (ref 150–400)
RBC: 3.9 MIL/uL (ref 3.87–5.11)
RDW: 13.2 % (ref 11.5–15.5)
WBC: 5.6 10*3/uL (ref 4.0–10.5)

## 2015-03-29 LAB — COMPREHENSIVE METABOLIC PANEL
ALT: 10 U/L (ref 6–29)
AST: 11 U/L (ref 10–35)
Albumin: 3.7 g/dL (ref 3.6–5.1)
Alkaline Phosphatase: 92 U/L (ref 33–130)
BILIRUBIN TOTAL: 0.5 mg/dL (ref 0.2–1.2)
BUN: 16 mg/dL (ref 7–25)
CALCIUM: 9 mg/dL (ref 8.6–10.4)
CO2: 27 mmol/L (ref 20–31)
Chloride: 103 mmol/L (ref 98–110)
Creat: 0.69 mg/dL (ref 0.50–0.99)
GLUCOSE: 135 mg/dL — AB (ref 65–99)
Potassium: 5 mmol/L (ref 3.5–5.3)
Sodium: 137 mmol/L (ref 135–146)
Total Protein: 6.6 g/dL (ref 6.1–8.1)

## 2015-03-29 LAB — LIPID PANEL
CHOL/HDL RATIO: 4.1 ratio (ref <=5.0)
Cholesterol: 118 mg/dL — ABNORMAL LOW (ref 125–200)
HDL: 29 mg/dL — AB (ref >=46)
LDL CALC: 47 mg/dL (ref <130)
Triglycerides: 211 mg/dL — ABNORMAL HIGH (ref <150)
VLDL: 42 mg/dL — ABNORMAL HIGH (ref <30)

## 2015-03-29 LAB — VITAMIN B12: VITAMIN B 12: 290 pg/mL (ref 211–911)

## 2015-03-31 ENCOUNTER — Encounter: Payer: Self-pay | Admitting: Cardiology

## 2015-03-31 LAB — PAP IG (IMAGE GUIDED)

## 2015-03-31 LAB — VITAMIN D 25 HYDROXY (VIT D DEFICIENCY, FRACTURES): Vit D, 25-Hydroxy: 28 ng/mL — ABNORMAL LOW (ref 30–100)

## 2015-04-11 LAB — IFOBT (OCCULT BLOOD): IFOBT: POSITIVE

## 2015-04-12 ENCOUNTER — Other Ambulatory Visit: Payer: Self-pay | Admitting: Urgent Care

## 2015-04-13 ENCOUNTER — Encounter (HOSPITAL_COMMUNITY): Payer: Self-pay

## 2015-04-13 ENCOUNTER — Ambulatory Visit (INDEPENDENT_AMBULATORY_CARE_PROVIDER_SITE_OTHER): Payer: Commercial Managed Care - HMO | Admitting: Physician Assistant

## 2015-04-13 ENCOUNTER — Other Ambulatory Visit: Payer: Self-pay | Admitting: Physician Assistant

## 2015-04-13 ENCOUNTER — Ambulatory Visit (HOSPITAL_COMMUNITY)
Admission: RE | Admit: 2015-04-13 | Discharge: 2015-04-13 | Disposition: A | Discharge status: Home / Self Care | Payer: Commercial Managed Care - HMO | Source: Ambulatory Visit | Attending: Physician Assistant | Admitting: Physician Assistant

## 2015-04-13 VITALS — BP 140/86 | HR 85 | Temp 98.6°F | Resp 18 | Ht 64.0 in | Wt 169.6 lb

## 2015-04-13 DIAGNOSIS — R1031 Right lower quadrant pain: Secondary | ICD-10-CM | POA: Diagnosis not present

## 2015-04-13 DIAGNOSIS — I7090 Unspecified atherosclerosis: Secondary | ICD-10-CM | POA: Insufficient documentation

## 2015-04-13 DIAGNOSIS — K760 Fatty (change of) liver, not elsewhere classified: Secondary | ICD-10-CM | POA: Insufficient documentation

## 2015-04-13 LAB — COMPLETE METABOLIC PANEL WITH GFR
ALK PHOS: 106 U/L (ref 33–130)
ALT: 8 U/L (ref 6–29)
AST: 10 U/L (ref 10–35)
Albumin: 4 g/dL (ref 3.6–5.1)
BUN: 15 mg/dL (ref 7–25)
CALCIUM: 8.9 mg/dL (ref 8.6–10.4)
CHLORIDE: 99 mmol/L (ref 98–110)
CO2: 29 mmol/L (ref 20–31)
Creat: 0.58 mg/dL (ref 0.50–0.99)
GFR, Est Non African American: >89 mL/min (ref >=60)
Glucose, Bld: 179 mg/dL — ABNORMAL HIGH (ref 65–99)
POTASSIUM: 4.8 mmol/L (ref 3.5–5.3)
SODIUM: 138 mmol/L (ref 135–146)
Total Bilirubin: 0.4 mg/dL (ref 0.2–1.2)
Total Protein: 6.7 g/dL (ref 6.1–8.1)

## 2015-04-13 LAB — POCT CBC
Granulocyte percent: 75.3 %G (ref 37–80)
HCT, POC: 30.8 % — AB (ref 37.7–47.9)
Hemoglobin: 11 g/dL — AB (ref 12.2–16.2)
Lymph, poc: 1.9 (ref 0.6–3.4)
MCH, POC: 29.4 pg (ref 27–31.2)
MCHC: 35.8 g/dL — AB (ref 31.8–35.4)
MCV: 82 fL (ref 80–97)
MID (CBC): 0.2 (ref 0–0.9)
MPV: 6.3 fL (ref 0–99.8)
PLATELET COUNT, POC: 380 10*3/uL (ref 142–424)
POC Granulocyte: 6.3 (ref 2–6.9)
POC LYMPH %: 22.2 % (ref 10–50)
POC MID %: 2.5 %M (ref 0–12)
RBC: 3.76 M/uL — AB (ref 4.04–5.48)
RDW, POC: 13.1 %
WBC: 8.4 10*3/uL (ref 4.6–10.2)

## 2015-04-13 LAB — POCT GLYCOSYLATED HEMOGLOBIN (HGB A1C): HEMOGLOBIN A1C: 8.5

## 2015-04-13 LAB — TSH: TSH: 0.294 u[IU]/mL — AB (ref 0.350–4.500)

## 2015-04-13 MED ORDER — ONDANSETRON HCL 4 MG PO TABS: 4.0000 mg | ORAL_TABLET | Freq: Three times a day (TID) | ORAL | Status: DC | PRN

## 2015-04-13 MED ORDER — IOHEXOL 300 MG/ML  SOLN: 75.0000 mL | Freq: Once | INTRAMUSCULAR | Status: DC | PRN

## 2015-04-13 NOTE — Progress Notes (Signed)
04/13/2015 1:21 PM   DOB: 01/18/1951 / MRN: [id number]  SUBJECTIVE:  Judith [name] is a 64 y.o. female presenting for right lower quadrant abdominal pain that started roughly 4 days ago and is worsening.  She describes the pain as sharp.  Movement makes the pain worse.  Riding in the car makes the pain worse.  She has a long history of abdominal pain however this seems different to her.  She reports she is somewhat anorexic, but continues to push fluids.  She is a diabetes and requires insulin.  She denies emesis but does feel nauseous. Denies fever, chills, and back pain. No dysuria, urgency and frequency.  No abnormal vaginal discharge. She denies hematochezia and melena. Reports some diarrhea.    She is allergic to ace inhibitors; codeine; iodine; neosporin; novolog; penicillins; and tape.   She  has a past medical history of Hypertension; Goiter; Chronic kidney disease; History of shingles (06/01/2013); Abnormal EKG (07/31/2013); GERD (gastroesophageal reflux disease); Carpal tunnel syndrome, bilateral; Coronary artery disease; Hyperlipidemia; Anginal pain ([place]); Asthma; Pneumonia (~ 1976); Type II diabetes mellitus ([place]); Arthritis; and Diabetic peripheral neuropathy ([place]) ("since 1996").    She  reports that she has been smoking Cigarettes.  She has a 20.5 pack-year smoking history. She has never used smokeless tobacco. She reports that she does not drink alcohol or use illicit drugs. She  reports that she does not currently engage in sexual activity. She reports using the following methods of birth control/protection: Post-menopausal and Surgical. The patient  has past surgical history that includes Tonsillectomy (1976); Cesarean section [id number]; 1984); Carpal tunnel release (Right, Nov 2015); Knee arthroscopy (Right, ~ 2003); Shoulder open rotator cuff repair (Right, 1996; 1998); Foot neuroma surgery (Bilateral, 2000); Carpal tunnel release (Right, 1992; 05/2014); Thyroid surgery (2000); Cardiac  catheterization (N/A, 02/27/2015); Cardiac catheterization (N/A, 03/06/2015); and Abdominal hysterectomy (1988).  Her family history includes Allergies in her sister; Breast cancer in her maternal grandmother; COPD in her father; Cancer (age of onset: 81) in her mother; Emphysema in her maternal grandfather and paternal grandfather; Heart attack in her father; Heart disease (age of onset: 61) in her father; Hypertension in her father; Leukemia in her paternal grandmother.  Review of Systems  Constitutional: Negative for fever and chills.  Cardiovascular: Negative for chest pain.  Gastrointestinal: Positive for nausea and abdominal pain. Negative for heartburn, vomiting, blood in stool and melena.  Skin: Negative for rash.  Neurological: Negative for headaches.    Problem list and medications reviewed and updated by myself where necessary, and exist elsewhere in the encounter.   OBJECTIVE:  BP 140/86 mmHg  Pulse 85  Temp(Src) 98.6 F (37 C) (Oral)  Resp 18  Ht 5\' 4"  (1.626 m)  Wt 169 lb 9.6 oz (76.93 kg)  BMI 29.10 kg/m2  SpO2 97% Estimated Creatinine Clearance: 73.2 mL/min (by C-G formula based on Cr of 0.69).  Physical Exam  Constitutional: She is oriented to person, place, and time.  Cardiovascular: Normal rate and regular rhythm.   Pulmonary/Chest: Effort normal and breath sounds normal.  Abdominal: Soft. She exhibits no distension and no mass. There is tenderness (generalized, worse the in the right lower quadrant. There is rebound pain. ). There is no guarding.  Neurological: She is alert and oriented to person, place, and time.  Skin: Skin is warm and dry.  Vitals reviewed.   Results for orders placed or performed in visit on 04/13/15 (from the past 48 hour(s))  POCT CBC  Status: Abnormal   Collection Time: 04/13/15  9:17 AM  Result Value Ref Range   WBC 8.4 4.6 - 10.2 K/uL   Lymph, poc 1.9 0.6 - 3.4   POC LYMPH PERCENT 22.2 10 - 50 %L   MID (cbc) 0.2 0 - 0.9   POC  MID % 2.5 0 - 12 %M   POC Granulocyte 6.3 2 - 6.9   Granulocyte percent 75.3 37 - 80 %G   RBC 3.76 (A) 4.04 - 5.48 M/uL   Hemoglobin 11.0 (A) 12.2 - 16.2 g/dL   HCT, POC 30.8 (A) 37.7 - 47.9 %   MCV 82.0 80 - 97 fL   MCH, POC 29.4 27 - 31.2 pg   MCHC 35.8 (A) 31.8 - 35.4 g/dL   RDW, POC 13.1 %   Platelet Count, POC 380 142 - 424 K/uL   MPV 6.3 0 - 99.8 fL  POCT glycosylated hemoglobin (Hb A1C)     Status: None   Collection Time: 04/13/15  9:18 AM  Result Value Ref Range   Hemoglobin A1C 8.5      ASSESSMENT AND PLAN  Errin was seen today for abdominal pain.  Diagnoses and all orders for this visit:  Abdominal pain, RLQ (right lower quadrant): Her abdominal CT scan is reassuring.  I have prescribed Zofran and will try a clear liquid diet.  Will plan to see her back for a recheck early next week.  If no improvement in symptoms will refer to GI for further evaluation and treatment.  -     POCT CBC -     COMPLETE METABOLIC PANEL WITH GFR -     POCT urinalysis dipstick -     TSH -     POCT glycosylated hemoglobin (Hb A1C) -     Cancel: CT Abdomen Pelvis W Contrast; Future -     CT Abdomen Pelvis W Contrast; Future    The patient was advised to call or return to clinic if she does not see an improvement in symptoms or to seek the care of the closest emergency department if she worsens with the above plan.   Philis Fendt, MHS, PA-C [facility] 04/13/2015 1:21 PM

## 2015-04-13 NOTE — Patient Instructions (Signed)
PLEASE GO TO [facility] EMERGENCY ROOM AND REGISTER AS AN OUTPATIENT FOR A SCHEDULED CT SCAN.

## 2015-04-14 NOTE — Progress Notes (Signed)
  Medical screening examination/treatment/procedure(s) were performed by non-physician practitioner and as supervising physician I was immediately available for consultation/collaboration.     

## 2015-04-15 ENCOUNTER — Encounter: Payer: Self-pay | Admitting: Internal Medicine

## 2015-04-15 NOTE — Addendum Note (Signed)
Addended by: [name] on: 04/15/2015 09:22 AM   Modules accepted: Orders

## 2015-04-21 ENCOUNTER — Other Ambulatory Visit (INDEPENDENT_AMBULATORY_CARE_PROVIDER_SITE_OTHER): Payer: Commercial Managed Care - HMO | Admitting: *Deleted

## 2015-04-21 ENCOUNTER — Other Ambulatory Visit: Payer: Self-pay | Admitting: *Deleted

## 2015-04-21 DIAGNOSIS — I2510 Atherosclerotic heart disease of native coronary artery without angina pectoris: Secondary | ICD-10-CM

## 2015-04-21 DIAGNOSIS — R7989 Other specified abnormal findings of blood chemistry: Secondary | ICD-10-CM

## 2015-04-21 LAB — HEPATIC FUNCTION PANEL
ALK PHOS: 81 U/L (ref 33–130)
ALT: 13 U/L (ref 6–29)
AST: 16 U/L (ref 10–35)
Albumin: 3.6 g/dL (ref 3.6–5.1)
BILIRUBIN DIRECT: 0.1 mg/dL (ref <=0.2)
BILIRUBIN TOTAL: 0.5 mg/dL (ref 0.2–1.2)
Indirect Bilirubin: 0.4 mg/dL (ref 0.2–1.2)
Total Protein: 6.6 g/dL (ref 6.1–8.1)

## 2015-04-21 LAB — LIPID PANEL
CHOL/HDL RATIO: 3.8 ratio (ref <=5.0)
CHOLESTEROL: 88 mg/dL — AB (ref 125–200)
HDL: 23 mg/dL — AB (ref >=46)
LDL Cholesterol: 31 mg/dL (ref <130)
Triglycerides: 170 mg/dL — ABNORMAL HIGH (ref <150)
VLDL: 34 mg/dL — ABNORMAL HIGH (ref <30)

## 2015-04-21 LAB — BASIC METABOLIC PANEL
BUN: 18 mg/dL (ref 7–25)
CALCIUM: 8.5 mg/dL — AB (ref 8.6–10.4)
CO2: 29 mmol/L (ref 20–31)
CREATININE: 0.69 mg/dL (ref 0.50–0.99)
Chloride: 97 mmol/L — ABNORMAL LOW (ref 98–110)
GLUCOSE: 188 mg/dL — AB (ref 65–99)
Potassium: 4.6 mmol/L (ref 3.5–5.3)
SODIUM: 135 mmol/L (ref 135–146)

## 2015-04-22 ENCOUNTER — Telehealth: Payer: Self-pay | Admitting: *Deleted

## 2015-04-22 DIAGNOSIS — E785 Hyperlipidemia, unspecified: Secondary | ICD-10-CM

## 2015-04-22 DIAGNOSIS — I2510 Atherosclerotic heart disease of native coronary artery without angina pectoris: Secondary | ICD-10-CM

## 2015-04-22 MED ORDER — ATORVASTATIN CALCIUM 20 MG PO TABS: 20.0000 mg | ORAL_TABLET | Freq: Every day | ORAL | Status: DC

## 2015-04-22 NOTE — Telephone Encounter (Signed)
Notes Recorded by Liliane Shi, PA-C on 04/21/2015 at 9:43 PM K+, Creatinine, LFTs ok LDL at goal TC is quite low. Decrease Lipitor to 20 mg QD. Repeat Lipids and LFTs in 3-4 mos Richardson Dopp, [place]

## 2015-04-29 ENCOUNTER — Ambulatory Visit (INDEPENDENT_AMBULATORY_CARE_PROVIDER_SITE_OTHER): Payer: Commercial Managed Care - HMO | Admitting: Internal Medicine

## 2015-04-29 ENCOUNTER — Emergency Department (HOSPITAL_COMMUNITY): Payer: Commercial Managed Care - HMO

## 2015-04-29 ENCOUNTER — Encounter (HOSPITAL_COMMUNITY): Payer: Self-pay

## 2015-04-29 ENCOUNTER — Observation Stay (HOSPITAL_COMMUNITY)
Admission: EM | Admit: 2015-04-29 | Discharge: 2015-05-01 | Disposition: A | Discharge status: Home / Self Care | Payer: Commercial Managed Care - HMO | Attending: Cardiology | Admitting: Cardiology

## 2015-04-29 VITALS — BP 154/86 | HR 100 | Temp 98.1°F | Resp 18 | Ht 64.0 in | Wt 173.0 lb

## 2015-04-29 DIAGNOSIS — Z794 Long term (current) use of insulin: Secondary | ICD-10-CM | POA: Diagnosis not present

## 2015-04-29 DIAGNOSIS — E1151 Type 2 diabetes mellitus with diabetic peripheral angiopathy without gangrene: Secondary | ICD-10-CM

## 2015-04-29 DIAGNOSIS — N189 Chronic kidney disease, unspecified: Secondary | ICD-10-CM | POA: Diagnosis not present

## 2015-04-29 DIAGNOSIS — I10 Essential (primary) hypertension: Secondary | ICD-10-CM | POA: Diagnosis present

## 2015-04-29 DIAGNOSIS — Z7902 Long term (current) use of antithrombotics/antiplatelets: Secondary | ICD-10-CM | POA: Diagnosis not present

## 2015-04-29 DIAGNOSIS — I2510 Atherosclerotic heart disease of native coronary artery without angina pectoris: Secondary | ICD-10-CM | POA: Diagnosis not present

## 2015-04-29 DIAGNOSIS — I480 Paroxysmal atrial fibrillation: Secondary | ICD-10-CM

## 2015-04-29 DIAGNOSIS — E1142 Type 2 diabetes mellitus with diabetic polyneuropathy: Secondary | ICD-10-CM | POA: Insufficient documentation

## 2015-04-29 DIAGNOSIS — I129 Hypertensive chronic kidney disease with stage 1 through stage 4 chronic kidney disease, or unspecified chronic kidney disease: Secondary | ICD-10-CM | POA: Diagnosis not present

## 2015-04-29 DIAGNOSIS — F17210 Nicotine dependence, cigarettes, uncomplicated: Secondary | ICD-10-CM | POA: Insufficient documentation

## 2015-04-29 DIAGNOSIS — Z87891 Personal history of nicotine dependence: Secondary | ICD-10-CM | POA: Diagnosis not present

## 2015-04-29 DIAGNOSIS — I25118 Atherosclerotic heart disease of native coronary artery with other forms of angina pectoris: Secondary | ICD-10-CM | POA: Diagnosis present

## 2015-04-29 DIAGNOSIS — I4819 Other persistent atrial fibrillation: Secondary | ICD-10-CM | POA: Diagnosis present

## 2015-04-29 DIAGNOSIS — R0602 Shortness of breath: Secondary | ICD-10-CM

## 2015-04-29 DIAGNOSIS — R195 Other fecal abnormalities: Secondary | ICD-10-CM | POA: Insufficient documentation

## 2015-04-29 DIAGNOSIS — Z79899 Other long term (current) drug therapy: Secondary | ICD-10-CM | POA: Insufficient documentation

## 2015-04-29 DIAGNOSIS — E119 Type 2 diabetes mellitus without complications: Secondary | ICD-10-CM

## 2015-04-29 DIAGNOSIS — R0789 Other chest pain: Secondary | ICD-10-CM | POA: Diagnosis not present

## 2015-04-29 DIAGNOSIS — E785 Hyperlipidemia, unspecified: Secondary | ICD-10-CM | POA: Diagnosis not present

## 2015-04-29 DIAGNOSIS — Z880 Allergy status to penicillin: Secondary | ICD-10-CM | POA: Diagnosis not present

## 2015-04-29 DIAGNOSIS — R000 Tachycardia, unspecified: Secondary | ICD-10-CM | POA: Diagnosis not present

## 2015-04-29 DIAGNOSIS — Z7982 Long term (current) use of aspirin: Secondary | ICD-10-CM | POA: Diagnosis not present

## 2015-04-29 DIAGNOSIS — K760 Fatty (change of) liver, not elsewhere classified: Secondary | ICD-10-CM | POA: Insufficient documentation

## 2015-04-29 DIAGNOSIS — E118 Type 2 diabetes mellitus with unspecified complications: Secondary | ICD-10-CM

## 2015-04-29 DIAGNOSIS — E1122 Type 2 diabetes mellitus with diabetic chronic kidney disease: Secondary | ICD-10-CM | POA: Insufficient documentation

## 2015-04-29 DIAGNOSIS — I4891 Unspecified atrial fibrillation: Secondary | ICD-10-CM | POA: Diagnosis not present

## 2015-04-29 DIAGNOSIS — E0590 Thyrotoxicosis, unspecified without thyrotoxic crisis or storm: Secondary | ICD-10-CM | POA: Diagnosis not present

## 2015-04-29 HISTORY — DX: Paroxysmal atrial fibrillation: I48.0

## 2015-04-29 LAB — CBC WITH DIFFERENTIAL/PLATELET
BASOS ABS: 0.1 10*3/uL (ref 0.0–0.1)
BASOS PCT: 1 %
EOS ABS: 0.4 10*3/uL (ref 0.0–0.7)
Eosinophils Relative: 4 %
HEMATOCRIT: 32.5 % — AB (ref 36.0–46.0)
HEMOGLOBIN: 11 g/dL — AB (ref 12.0–15.0)
Lymphocytes Relative: 25 %
Lymphs Abs: 2.3 10*3/uL (ref 0.7–4.0)
MCH: 29.3 pg (ref 26.0–34.0)
MCHC: 33.8 g/dL (ref 30.0–36.0)
MCV: 86.4 fL (ref 78.0–100.0)
MONOS PCT: 6 %
Monocytes Absolute: 0.5 10*3/uL (ref 0.1–1.0)
NEUTROS ABS: 5.9 10*3/uL (ref 1.7–7.7)
NEUTROS PCT: 64 %
Platelets: 322 10*3/uL (ref 150–400)
RBC: 3.76 MIL/uL — AB (ref 3.87–5.11)
RDW: 13.4 % (ref 11.5–15.5)
WBC: 9.2 10*3/uL (ref 4.0–10.5)

## 2015-04-29 LAB — BRAIN NATRIURETIC PEPTIDE: B Natriuretic Peptide: 150.7 pg/mL — ABNORMAL HIGH (ref 0.0–100.0)

## 2015-04-29 LAB — COMPREHENSIVE METABOLIC PANEL
ALBUMIN: 3.1 g/dL — AB (ref 3.5–5.0)
ALK PHOS: 111 U/L (ref 38–126)
ALT: 12 U/L — AB (ref 14–54)
ANION GAP: 11 (ref 5–15)
AST: 18 U/L (ref 15–41)
BILIRUBIN TOTAL: 0.2 mg/dL — AB (ref 0.3–1.2)
BUN: 11 mg/dL (ref 6–20)
CALCIUM: 9.1 mg/dL (ref 8.9–10.3)
CO2: 23 mmol/L (ref 22–32)
CREATININE: 0.71 mg/dL (ref 0.44–1.00)
Chloride: 102 mmol/L (ref 101–111)
GFR calc Af Amer: >60 mL/min (ref >60)
GFR calc non Af Amer: >60 mL/min (ref >60)
GLUCOSE: 349 mg/dL — AB (ref 65–99)
Potassium: 4.1 mmol/L (ref 3.5–5.1)
SODIUM: 136 mmol/L (ref 135–145)
TOTAL PROTEIN: 6.1 g/dL — AB (ref 6.5–8.1)

## 2015-04-29 LAB — PROTIME-INR
INR: 1.01 (ref 0.00–1.49)
Prothrombin Time: 13.5 seconds (ref 11.6–15.2)

## 2015-04-29 LAB — GLUCOSE, POCT (MANUAL RESULT ENTRY): POC GLUCOSE: 369 mg/dL — AB (ref 70–99)

## 2015-04-29 LAB — TROPONIN I: Troponin I: <0.03 ng/mL (ref <0.031)

## 2015-04-29 LAB — APTT: aPTT: 29 seconds (ref 24–37)

## 2015-04-29 LAB — TSH: TSH: 0.252 u[IU]/mL — AB (ref 0.350–4.500)

## 2015-04-29 MED ORDER — INSULIN DETEMIR 100 UNIT/ML ~~LOC~~ SOLN
20.0000 [IU] | Freq: Every day | SUBCUTANEOUS | Status: DC
  Administered 2015-04-30: 25 [IU] via SUBCUTANEOUS
  Filled 2015-04-29 (×2): qty 0.25

## 2015-04-29 MED ORDER — APIXABAN 5 MG PO TABS
5.0000 mg | ORAL_TABLET | Freq: Two times a day (BID) | ORAL | Status: DC
  Administered 2015-04-30 (×3): 5 mg via ORAL
  Filled 2015-04-29 (×4): qty 1

## 2015-04-29 MED ORDER — ALBUTEROL SULFATE (2.5 MG/3ML) 0.083% IN NEBU: 3.0000 mL | INHALATION_SOLUTION | Freq: Four times a day (QID) | RESPIRATORY_TRACT | Status: DC | PRN

## 2015-04-29 MED ORDER — METOPROLOL SUCCINATE ER 50 MG PO TB24
50.0000 mg | ORAL_TABLET | Freq: Every day | ORAL | Status: DC
  Administered 2015-04-30: 50 mg via ORAL
  Filled 2015-04-29: qty 1

## 2015-04-29 MED ORDER — LOSARTAN POTASSIUM 50 MG PO TABS
50.0000 mg | ORAL_TABLET | Freq: Every day | ORAL | Status: DC
  Administered 2015-04-30: 50 mg via ORAL
  Filled 2015-04-29: qty 1

## 2015-04-29 MED ORDER — DEXTROSE 5 % IV SOLN
5.0000 mg/h | Freq: Once | INTRAVENOUS | Status: AC
  Administered 2015-04-29: 5 mg/h via INTRAVENOUS
  Filled 2015-04-29: qty 100

## 2015-04-29 MED ORDER — FLUOXETINE HCL 20 MG PO CAPS
20.0000 mg | ORAL_CAPSULE | Freq: Every day | ORAL | Status: DC
  Administered 2015-04-30: 20 mg via ORAL
  Filled 2015-04-29: qty 1

## 2015-04-29 MED ORDER — CLOPIDOGREL BISULFATE 75 MG PO TABS
75.0000 mg | ORAL_TABLET | Freq: Every day | ORAL | Status: DC
  Administered 2015-04-29 – 2015-04-30 (×2): 75 mg via ORAL
  Filled 2015-04-29 (×2): qty 1

## 2015-04-29 MED ORDER — METFORMIN HCL 500 MG PO TABS
1000.0000 mg | ORAL_TABLET | Freq: Two times a day (BID) | ORAL | Status: DC
  Administered 2015-04-30 – 2015-05-01 (×3): 1000 mg via ORAL
  Filled 2015-04-29 (×4): qty 2

## 2015-04-29 MED ORDER — TRAMADOL HCL 50 MG PO TABS
100.0000 mg | ORAL_TABLET | Freq: Every day | ORAL | Status: DC
Start: 2015-04-29 — End: 2015-05-01
  Administered 2015-04-30 (×2): 100 mg via ORAL
  Filled 2015-04-29 (×2): qty 2

## 2015-04-29 MED ORDER — IPRATROPIUM BROMIDE 0.06 % NA SOLN
2.0000 | Freq: Two times a day (BID) | NASAL | Status: DC
  Administered 2015-04-30: 2 via NASAL
  Filled 2015-04-29: qty 30

## 2015-04-29 MED ORDER — DILTIAZEM HCL 25 MG/5ML IV SOLN
10.0000 mg | Freq: Once | INTRAVENOUS | Status: AC
  Administered 2015-04-29: 10 mg via INTRAVENOUS
  Filled 2015-04-29: qty 5

## 2015-04-29 MED ORDER — DEXTROSE 5 % IV SOLN: 5.0000 mg/h | Freq: Once | INTRAVENOUS | Status: AC

## 2015-04-29 MED ORDER — PANTOPRAZOLE SODIUM 40 MG PO TBEC
40.0000 mg | DELAYED_RELEASE_TABLET | Freq: Every day | ORAL | Status: DC
  Administered 2015-04-30: 40 mg via ORAL
  Filled 2015-04-29: qty 1

## 2015-04-29 MED ORDER — ONDANSETRON HCL 4 MG/2ML IJ SOLN
4.0000 mg | Freq: Four times a day (QID) | INTRAMUSCULAR | Status: DC | PRN
  Administered 2015-05-01: 4 mg via INTRAVENOUS
  Filled 2015-04-29: qty 2

## 2015-04-29 MED ORDER — ACETAMINOPHEN 325 MG PO TABS: 650.0000 mg | ORAL_TABLET | ORAL | Status: DC | PRN

## 2015-04-29 MED ORDER — INSULIN ASPART 100 UNIT/ML ~~LOC~~ SOLN
0.0000 [IU] | Freq: Three times a day (TID) | SUBCUTANEOUS | Status: DC
  Administered 2015-04-30: 3 [IU] via SUBCUTANEOUS
  Administered 2015-04-30 (×2): 5 [IU] via SUBCUTANEOUS
  Administered 2015-05-01: 3 [IU] via SUBCUTANEOUS

## 2015-04-29 MED ORDER — DILTIAZEM HCL 25 MG/5ML IV SOLN
10.0000 mg | Freq: Once | INTRAVENOUS | Status: AC
  Administered 2015-04-29: 10 mg via INTRAVENOUS

## 2015-04-29 MED ORDER — ATORVASTATIN CALCIUM 20 MG PO TABS
20.0000 mg | ORAL_TABLET | Freq: Every day | ORAL | Status: DC
  Administered 2015-04-30: 20 mg via ORAL
  Filled 2015-04-29: qty 1

## 2015-04-29 MED ORDER — BUPROPION HCL ER (SR) 150 MG PO TB12
150.0000 mg | ORAL_TABLET | Freq: Two times a day (BID) | ORAL | Status: DC
  Administered 2015-04-30 (×3): 150 mg via ORAL
  Filled 2015-04-29 (×3): qty 1

## 2015-04-29 MED ORDER — DILTIAZEM HCL ER COATED BEADS 240 MG PO CP24
240.0000 mg | ORAL_CAPSULE | Freq: Every day | ORAL | Status: DC
  Administered 2015-04-30 (×2): 240 mg via ORAL
  Filled 2015-04-29 (×2): qty 1

## 2015-04-29 NOTE — ED Notes (Addendum)
Pt. Coming from urgent care c/o of chest pain starting around 0345 while resting watching tv. Upon arrival to urgent care pt. Was tachy in the 170's with new onset afib. Pt. Chest pain felt like 'her heart was trying to pound out of my chest'. Pt. Given 1 nitro at urgent care with decreased pain and 1 nitro en route with EMS that resolved the chest pain. Pt. Also given 324 ASA and 2 10mg  doses of cardizem en route. Pt. Heart rate did not respond to cardizem. Pt. Hx of cardiac stent placement 6 weeks ago with no complications. Pt. Is an insulin dependent diabetic with sugar at 345 with EMS. Pt. AOx4.

## 2015-04-29 NOTE — ED Notes (Signed)
Pt just converted to SR on EKG, Dr. Cheral Bay EDP notified, Cardiology at the bedside now.

## 2015-04-29 NOTE — Progress Notes (Signed)
04/29/2015 5:01 PM   DOB: [birth year] / MRN: [id number]  SUBJECTIVE:  Judith [name] is a 64 y.o. female presenting for chest pressure and palpitations that started at 3:45 today.  Had a few stents placed in her heart about 6 weeks ago and has been feeling overall well.  No history of rhythm disturbance. She has nitroglycerin but has not taken any. She has a history of diabetes.   She had stents placed on 03/06/15 for the diagnosis of exertional angina and that procedure note is included in the note.  .    She is allergic to ace inhibitors; codeine; iodine; neosporin; novolog; penicillins; and tape.   She  has a past medical history of Hypertension; Goiter; Chronic kidney disease; History of shingles (06/01/2013); Abnormal EKG (07/31/2013); GERD (gastroesophageal reflux disease); Carpal tunnel syndrome, bilateral; Coronary artery disease; Hyperlipidemia; Anginal pain ([place]); Asthma; Pneumonia (~ 1976); Type II diabetes mellitus ([place]); Arthritis; and Diabetic peripheral neuropathy ([place]) ("since 1996").    She  reports that she has been smoking Cigarettes.  She has a 20.5 pack-year smoking history. She has never used smokeless tobacco. She reports that she does not drink alcohol or use illicit drugs. She  reports that she does not currently engage in sexual activity. She reports using the following methods of birth control/protection: Post-menopausal and Surgical. The patient  has past surgical history that includes Tonsillectomy (1976); Cesarean section (8264; 1984); Carpal tunnel release (Right, Nov 2015); Knee arthroscopy (Right, ~ 2003); Shoulder open rotator cuff repair (Right, 1996; 1998); Foot neuroma surgery (Bilateral, 2000); Carpal tunnel release (Right, 1992; 05/2014); Thyroid surgery (2000); Cardiac catheterization (N/A, 02/27/2015); Cardiac catheterization (N/A, 03/06/2015); and Abdominal hysterectomy (1988).  Her family history includes Allergies in her sister; Breast cancer in her maternal  grandmother; COPD in her father; Cancer (age of onset: 84) in her mother; Emphysema in her maternal grandfather and paternal grandfather; Heart attack in her father; Heart disease (age of onset: 54) in her father; Hypertension in her father; Leukemia in her paternal grandmother.  Review of Systems  Constitutional: Negative for fever and chills.  Eyes: Negative for blurred vision.  Respiratory: Positive for shortness of breath. Negative for cough, sputum production and wheezing.   Cardiovascular: Positive for palpitations and leg swelling. Negative for chest pain.  Gastrointestinal: Negative for nausea and abdominal pain.  Genitourinary: Negative for dysuria, urgency and frequency.  Musculoskeletal: Negative for myalgias.  Skin: Negative for rash.  Neurological: Negative for dizziness, tingling and headaches.  Psychiatric/Behavioral: Negative for depression. The patient is not nervous/anxious.     Problem list and medications reviewed and updated by myself where necessary, and exist elsewhere in the encounter.   OBJECTIVE:  BP 154/86 mmHg  Pulse 100  Temp(Src) 98.1 F (36.7 C) (Oral)  Resp 18  Ht 5' 4" (1.626 m)  Wt 173 lb (78.472 kg)  BMI 29.68 kg/m2  SpO2 99%  Physical Exam  Constitutional: She is oriented to person, place, and time. She appears well-nourished. No distress.  Eyes: EOM are normal. Pupils are equal, round, and reactive to light.  Cardiovascular: An irregular rhythm present. Tachycardia present.  Exam reveals no decreased pulses.   Pulmonary/Chest: Effort normal.  Abdominal: She exhibits no distension.  Musculoskeletal:       Right ankle: She exhibits no swelling.       Left ankle: She exhibits no swelling.  Neurological: She is alert and oriented to person, place, and time. No cranial nerve deficit. Gait normal.  Skin: Skin is dry.  She is not diaphoretic.  Psychiatric: She has a normal mood and affect.  Vitals reviewed.   Results for orders placed or  performed in visit on 04/29/15 (from the past 48 hour(s))  POCT glucose (manual entry)     Status: Abnormal   Collection Time: 04/29/15  4:52 PM  Result Value Ref Range   POC Glucose 369 (A) 70 - 99 mg/dl   No improvement in symptoms with nitroglycerin.    Left Anterior Descending   . Prox LAD to Mid LAD lesion, 40% stenosed. calcified diffuse.      Left Circumflex   . Mid Cx lesion, 80% stenosed. calcified.   . Angioplasty: Pre-stent angioplasty was performed. A drug-eluting stent was placed. A post-stent angioplasty was not performed. Maximum pressure: 16 atm. The pre-interventional distal flow is normal (TIMI 3). The post-interventional distal flow is normal (TIMI 3). The intervention was successful. No complications occured at this lesion. IC nitroglycerin was given. The mid circumflex/obtuse marginal bifurcation is treated. There is complex bifurcation stenosis present. I planned on using a bifurcation stenting technique. Angiomax is used for anticoagulation. The patient is adequately preloaded with aspirin and Plavix. A 7 French XB 3.5 cm guide catheter was utilized. Once a therapeutic ACT was achieved, a cougar guidewire is advanced into the OM. A second cougar guidewire is advanced into the distal circumflex. The obtuse marginal branches predilated with a 2.5 x 20 mm noncompliant balloon. The circumflex is then predilated with a 3.0 mm balloon. With the balloon present in the circumflex, a 2.5 x 20 mm Promus DES his advanced into the OM branch. The stent is deployed so that the ostium is covered. Simultaneously, the circumflex balloon is dilated in order to keep the circumflex portion patent. The circumflex stent is then advanced so that it lays across the OM origin. A 3.5 x 12 mm Promus DES was chosen. This stent is deployed at 14 atm. I tried to rewire the OM branch but was unsuccessful. I decided to post dilate the circumflex stent in order to open the cells better. A 4.0 mm noncompliant  balloon is chosen. It is dilated to 16 atm. I was then able to wire the obtuse marginal branch with a whisper wire. The OM stent is postdilated with a 2.5 mm noncompliant balloon. A final kissing balloon is done with the 4.0 mm balloon and the circumflex and the 2.5 mm balloon in the obtuse marginal. At the completion of the procedure there is 0% residual stenosis in the circumflex and 20% residual stenosis at the obtuse marginal origin. There is TIMI-3 flow into both vessels.  . There is no residual stenosis post intervention.     . Second Obtuse Marginal Branch   . Ost 2nd Mrg to 2nd Mrg lesion, 70% stenosed. calcified diffuse.   . Angioplasty: Pre-stent angioplasty was performed. A drug-eluting stent was placed. A post-stent angioplasty was not performed. The pre-interventional distal flow is normal (TIMI 3). The post-interventional distal flow is normal (TIMI 3). The intervention was successful. No complications occured at this lesion. IC nitroglycerin was given. See description under LCx lesion, bifurcation stenting technique utilized  . There is a 20% residual stenosis post intervention.       Right Coronary Artery  Dist RCA filled by collaterals from Dist LAD.   Marland Kitchen Prox RCA lesion, 100% stenosed. calcified diffuse.      Implants    Name ID Temporary Type Supply   STENT PROMUS PREM MR 2.5X20 - [id number] No  Permanent Stent STENT PROMUS PREM MR 2.5X20   Ronna Polio PREM MR 3.5X12 - [id number] No Permanent Stent STENT PROMUS PREM MR 3.5X12     ASSESSMENT AND PLAN  Judith [name] was seen today for heart pounding and irregular and dizziness.  Diagnoses and all orders for this visit:  Chest pressure: [facility].   -     EKG 12-Lead -     Peripheral IV left arm -     0.4 nitro SL.   Type 2 diabetes mellitus with complication, without long-term current use of insulin (HCC) -     POCT glucose (manual entry)  Tachycardia: Managed with problem 1.      The  patient was advised to call or return to clinic if she does not see an improvement in symptoms or to seek the care of the closest emergency department if she worsens with the above plan.   Philis Fendt, MHS, PA-C [facility] 04/29/2015 5:01 PM I have participated in the care of this patient with the Advanced Practice Provider and agree with Diagnosis and Plan as documented. Robert P. Laney Pastor, M.D.

## 2015-04-29 NOTE — ED Notes (Signed)
Pt. Experienced 7 beat run of Vtach. MD notfied. Pt. Is not in distress at this time. MD advised for another 10mg  bolus of cardizem.

## 2015-04-29 NOTE — H&P (Addendum)
Cardiologist:  Judith [name] is an 64 y.o. female.   Chief Complaint:   HPI:   The patient is a 64 yo female with a hx of diabetes, HTN, tobacco abuse. She was evaluated by Dr. Radford Pax in 4/15 for chest pain. Echo demonstrated normal LVF. Nuclear stress test was cancelled when her symptoms improved with PPI therapy. Last seen by Dr. Fransico Him 4/15.  Admitted 10/5-10/6 with chest pain in the setting of UTI and viral gastroenteritis complicated by AKI and hypoNa. She was evaluated by Cardiology. CEs remained neg. Echo demonstrated mod LVH, EF 60-65%, no RWMA, Gr 1 DD, MAC. OP stress test was recommended. This was done 02/21/15 and demonstrated EF 60% with inf-lat and apical lateral ischemia (intermediate risk).   She underwent a cardiac cath on 02/27/15 which demonstrated 2 v CAD with CTO of the RCA and high grade bifurcational LCx/OM stenosis. She was brought back for PCI and underwent DES x 2 to the LCx/OM.  She presents today with A fib RVR.  She reports her her started beating out of her chest at 1545hrs this afternoon.  She was SOB, and nauseated with some discomfort up near her neck.  She was a little dizzy when she stood up for the CXR.  She denies vomiting, fever, chest pain, orthopnea, PND, cough, congestion, abdominal pain, hematochezia, melena, lower extremity edema, claudication.  She was occult blood positive a couple weeks ago, no gross bleeding, and is scheduled to see GI in February.  Medications:  Medication Sig  albuterol (PROVENTIL HFA) 108 (90 BASE) MCG/ACT inhaler Inhale into the lungs every 6 (six) hours as needed for wheezing or shortness of breath.  aspirin EC 81 MG tablet Take 1 tablet (81 mg total) by mouth daily.  atorvastatin (LIPITOR) 20 MG tablet Take 1 tablet (20 mg total) by mouth daily.  buPROPion (WELLBUTRIN SR) 150 MG 12 hr tablet TAKE ONE TABLET BY MOUTH TWICE DAILY  clopidogrel (PLAVIX) 75 MG tablet Take 1 tablet (75 mg total) by mouth daily.   cyclobenzaprine (FLEXERIL) 10 MG tablet TAKE ONE TABLET BY MOUTH AT BEDTIME Patient taking differently: TAKE ONE TABLET BY MOUTH AS NEEDED FOR MUSCLE SPASMS  FLUoxetine (PROZAC) 20 MG capsule TAKE ONE CAPSULE BY MOUTH ONCE DAILY  gabapentin (NEURONTIN) 300 MG capsule Three capsules at lunchtime; take three capsules at bedtime Patient taking differently: Take 600-900 mg by mouth 2 (two) times daily. TAKES 2 CAPS IN AM AND 3 CAPS AT BEDTIME  Insulin Detemir (LEVEMIR) 100 UNIT/ML Pen Inject 20-25 Units into the skin daily at 10 pm.  insulin lispro (HUMALOG) 100 UNIT/ML injection Inject 0.05-0.1 mLs (5-10 Units total) into the skin 3 (three) times daily with meals. Patient taking differently: Inject 3-5 Units into the skin 3 (three) times daily with meals.   ipratropium (ATROVENT) 0.03 % nasal spray Place 2 sprays into both nostrils 2 (two) times daily.  losartan (COZAAR) 50 MG tablet Take 1 tablet (50 mg total) by mouth daily.  metFORMIN (GLUCOPHAGE) 1000 MG tablet TAKE ONE TABLET BY MOUTH TWICE DAILY WITH MEALS  metoprolol succinate (TOPROL-XL) 50 MG 24 hr tablet Take 1 tablet (50 mg total) by mouth daily. Take with or immediately following a meal.  nitroGLYCERIN (NITROSTAT) 0.4 MG SL tablet Place 1 tablet (0.4 mg total) under the tongue every 5 (five) minutes as needed for chest pain.  ondansetron (ZOFRAN) 4 MG tablet Take 1 tablet (4 mg total) by mouth every 8 (eight) hours as needed for nausea  or vomiting.  pantoprazole (PROTONIX) 40 MG tablet Take 1 tablet (40 mg total) by mouth daily.  traMADol (ULTRAM) 50 MG tablet Take 1 tablet by mouth in the afternoon and 2 tablets at bedtime. Patient taking differently: Take 100 mg by mouth at bedtime.   triamcinolone cream (KENALOG) 0.1 % Apply 1 application topically 2 (two) times daily. May mix 1:1 with eurerin cream at home  glipiZIDE (GLUCOTROL XL) 5 MG 24 hr tablet Take 1 tablet (5 mg total) by mouth daily with lunch.  Insulin Pen Needle 32G X 6 MM  MISC Use as directed     Past Medical History  Diagnosis Date  . Hypertension   . Goiter   . Chronic kidney disease   . History of shingles 06/01/2013  . Abnormal EKG 07/31/2013  . GERD (gastroesophageal reflux disease)   . Carpal tunnel syndrome, bilateral   . Coronary artery disease   . Hyperlipidemia   . Anginal pain ([place])   . Asthma   . Pneumonia ~ 1976  . Type II diabetes mellitus (HCC)     insulin dependent  . Arthritis     "hands" (03/06/2015)  . Diabetic peripheral neuropathy ([place]) "since 1996"    Past Surgical History  Procedure Laterality Date  . Tonsillectomy  1976  . Cesarean section  1982; 1984  . Carpal tunnel release Right Nov 2015  . Knee arthroscopy Right ~ 2003    "meniscus repair"  . Shoulder open rotator cuff repair Right 1996; 1998    "w/fracture repair"  . Foot neuroma surgery Bilateral 2000  . Carpal tunnel release Right 1992; 05/2014    [name]; [place]  . Thyroid surgery  2000    "removed lots of nodules"  . Cardiac catheterization N/A 02/27/2015    Procedure: Left Heart Cath and Coronary Angiography;  Surgeon: [name], MD; LAD 40%, mCFX 80%, OM 70%, RCA 100% calcified       . Cardiac catheterization N/A 03/06/2015    Procedure: Coronary Stent Intervention;  Surgeon: [name], MD;  Location: [facility];  Service: Cardiovascular;  Laterality: N/A;  Mid CX 3.50x12 promus DES w/ 0% resdual and Prox OM1 2.50x20 promus DES w/ 20% residual  . Abdominal hysterectomy  1988    age 54; CERVICAL DYSPLASIA; ovaries intact.     Family History  Problem Relation Age of Onset  . Cancer Mother 72    bronchial cancer  . Hypertension Father   . COPD Father   . Heart disease Father 39    CAD with cardiac stenting  . Heart attack Father   . Allergies Sister   . Breast cancer Maternal Grandmother   . Emphysema Maternal Grandfather   . Leukemia Paternal Grandmother   . Emphysema Paternal Grandfather    Social History:  reports that  she has been smoking Cigarettes.  She has a 20.5 pack-year smoking history. She has never used smokeless tobacco. She reports that she does not drink alcohol or use illicit drugs.  Allergies:  Allergies  Allergen Reactions  . Novolog [Insulin Aspart] Shortness Of Breath and Other (See Comments)    "breathing problems"  . Codeine Nausea And Vomiting and Other (See Comments)    HIGH DOSES-SEVERE VOMITING  . Iodine Other (See Comments)    MUST HAVE BENADRYL PRIOR TO PROCEDURE AND RIGHT BEFORE TREATMENT TO COUNTERACT REACTION-BLISTERING REACTION DERMATOLOGICAL  . Ace Inhibitors Cough  . Neosporin [Neomycin-Bacitracin Zn-Polymyx] Itching and Rash    MAKES REACTIONS WORSE WHEN USING  AS PROPHYLACTIC  . Penicillins Itching, Rash and Other (See Comments)    CHEST SIZED RASH AND ITCHING   . Tape Itching and Rash     (Not in a hospital admission)  Results for orders placed or performed during the hospital encounter of 04/29/15 (from the past 48 hour(s))  Brain natriuretic peptide     Status: Abnormal   Collection Time: 04/29/15  6:02 PM  Result Value Ref Range   B Natriuretic Peptide 150.7 (H) 0.0 - 100.0 pg/mL  CBC with Differential/Platelet     Status: Abnormal   Collection Time: 04/29/15  6:14 PM  Result Value Ref Range   WBC 9.2 4.0 - 10.5 K/uL   RBC 3.76 (L) 3.87 - 5.11 MIL/uL   Hemoglobin 11.0 (L) 12.0 - 15.0 g/dL   HCT 32.5 (L) 36.0 - 46.0 %   MCV 86.4 78.0 - 100.0 fL   MCH 29.3 26.0 - 34.0 pg   MCHC 33.8 30.0 - 36.0 g/dL   RDW 13.4 11.5 - 15.5 %   Platelets 322 150 - 400 K/uL   Neutrophils Relative % 64 %   Neutro Abs 5.9 1.7 - 7.7 K/uL   Lymphocytes Relative 25 %   Lymphs Abs 2.3 0.7 - 4.0 K/uL   Monocytes Relative 6 %   Monocytes Absolute 0.5 0.1 - 1.0 K/uL   Eosinophils Relative 4 %   Eosinophils Absolute 0.4 0.0 - 0.7 K/uL   Basophils Relative 1 %   Basophils Absolute 0.1 0.0 - 0.1 K/uL  Comprehensive metabolic panel     Status: Abnormal   Collection Time: 04/29/15   6:14 PM  Result Value Ref Range   Sodium 136 135 - 145 mmol/L   Potassium 4.1 3.5 - 5.1 mmol/L   Chloride 102 101 - 111 mmol/L   CO2 23 22 - 32 mmol/L   Glucose, Bld 349 (H) 65 - 99 mg/dL   BUN 11 6 - 20 mg/dL   Creatinine, Ser 0.71 0.44 - 1.00 mg/dL   Calcium 9.1 8.9 - 10.3 mg/dL   Total Protein 6.1 (L) 6.5 - 8.1 g/dL   Albumin 3.1 (L) 3.5 - 5.0 g/dL   AST 18 15 - 41 U/L   ALT 12 (L) 14 - 54 U/L   Alkaline Phosphatase 111 38 - 126 U/L   Total Bilirubin 0.2 (L) 0.3 - 1.2 mg/dL   GFR calc non Af Amer >60 >60 mL/min   GFR calc Af Amer >60 >60 mL/min    Comment: (NOTE) The eGFR has been calculated using the CKD EPI equation. This calculation has not been validated in all clinical situations. eGFR's persistently <60 mL/min signify possible Chronic Kidney Disease.    Anion gap 11 5 - 15  Troponin I     Status: None   Collection Time: 04/29/15  6:14 PM  Result Value Ref Range   Troponin I <0.03 <0.031 ng/mL    Comment:        NO INDICATION OF MYOCARDIAL INJURY.   Protime-INR     Status: None   Collection Time: 04/29/15  6:14 PM  Result Value Ref Range   Prothrombin Time 13.5 11.6 - 15.2 seconds   INR 1.01 0.00 - 1.49  APTT     Status: None   Collection Time: 04/29/15  6:14 PM  Result Value Ref Range   aPTT 29 24 - 37 seconds  TSH     Status: Abnormal   Collection Time: 04/29/15  6:14 PM  Result Value  Ref Range   TSH 0.252 (L) 0.350 - 4.500 uIU/mL   Dg Chest 2 View  04/29/2015  CLINICAL DATA:  Chest pain and shortness of breath for 1 day, hypertension, coronary artery disease, smoker, type II diabetes mellitus, asthma EXAM: CHEST  2 VIEW COMPARISON:  02/12/2015 FINDINGS: Minimal enlargement of cardiac silhouette. Coronary arterial calcification versus stent. Mediastinal contours and pulmonary vascularity normal. Bronchitic changes and hyperinflation consistent with COPD. No acute infiltrate, pleural effusion or pneumothorax. Bones demineralized. IMPRESSION: COPD changes.  Minimal enlargement of cardiac silhouette. No acute abnormalities. Electronically Signed   By: Lavonia Dana M.D.   On: 04/29/2015 19:08    Review of Systems  Eyes: Negative.   Respiratory: Positive for shortness of breath.   Cardiovascular: Positive for palpitations.  Gastrointestinal: Positive for blood in stool.  Neurological: Positive for dizziness.  All other systems reviewed and are negative.   Blood pressure 138/76, pulse 94, temperature 98.4 F (36.9 C), temperature source Oral, resp. rate 19, height _0  (1.626 m), weight 173 lb (78.472 kg), SpO2 95 %. Physical Exam  Nursing note and vitals reviewed. Constitutional: She is oriented to person, place, and time. She appears well-developed and well-nourished.  HENT:  Head: Normocephalic and atraumatic.  Eyes: Pupils are equal, round, and reactive to light.  Neck: Normal range of motion.  Cardiovascular: Regular rhythm.   Respiratory: Effort normal. She has no wheezes. She has no rales.  GI: Soft. Bowel sounds are normal.  Musculoskeletal: Normal range of motion.  Neurological: She is alert and oriented to person, place, and time.  Skin: Skin is warm and dry.  Psychiatric: She has a normal mood and affect.     Assessment/Plan Principal Problem:   Atrial fibrillation with RVR (HCC) Active Problems:   Diabetes (HCC)   HTN (hypertension)   Coronary artery disease involving native coronary artery of native heart without angina pectoris   1. Atrial fibrillation with rapid ventricular response - Judith [name] had the onset of palpitations today at exactly 3:45 PM. She was watching TV and she felt her heart racing and consider taking nitroglycerin but instead called EMS. On admission she was noted to be in A. fib with RVR at a rapid rate and had improvement in ventricular response with IV Cardizem. She recently was found to have obstructive coronary artery disease and had stents placed, therefore she is on aspirin and Plavix. This  patients CHA2DS2-VASc Score and unadjusted Ischemic Stroke Rate (% per year) is equal to 4.8 % stroke rate/year from a score of 4 (Above score calculated as 1 point each if present [CHF, HTN, DM, Vascular=MI/PAD/Aortic Plaque, Age if 65-74, or Female] Above score calculated as 2 points each if present [Age > 75, or Stroke/TIA/TE]. Based on this, I would recommend starting a direct oral anticoagulant. As her stents were placed in October, I would recommend holding aspirin and continue Plavix. Will start Eliquis 5 mg twice a day. She did mention that she's had some guaiac positive stools recently, therefore we will need to watch her closely for the possibility of rectal bleeding. As there is no acute bleeding, I suspect GI will not want to see her as an inpatient. She does have an outpatient appointment scheduled in February. If there is no bleeding overnight, she may be a candidate for early cardioversion tomorrow.  2. Hypertension- Appears well controlled. Continue her home Toprol XL and Cozaar.  3. Type 2 diabetes- continue home antidiabetic medications.  4. Suppressed TSH- TSH is 0.252. Check  free T4 and T3, to evaluate for hyperthyroidism.  5. Dyslipidemia- well-controlled. Continue current dose Lipitor.  Pixie Casino, MD, [facility] Attending Cardiologist [place]

## 2015-04-29 NOTE — ED Notes (Signed)
Patient transported to X-ray 

## 2015-04-29 NOTE — Progress Notes (Signed)
Pt. Arrive at [facility]. Resting comfortably. SR in the monitor. Reported no pain.  Sherry Blackard H Coral Timme

## 2015-04-29 NOTE — ED Provider Notes (Signed)
CSN: [id number]     Arrival date & time 04/29/15  1748 History   First MD Initiated Contact with Patient 04/29/15 1754     Chief Complaint  Patient presents with  . Atrial Fibrillation  . Chest Pain     (Consider location/radiation/quality/duration/timing/severity/associated sxs/prior Treatment) HPI Comments: Patient here complaining of acute onset of shortness of breath as well as rapid irregular heartbeat which began at approximately 345 today. She also noted some chest pressure without cough or fever. she denies any syncope or nursing to be..Patient went to urgent care and those records were reviewed and patient was found to be in atrial fibrillation with rapid ventricular rate response. She was given nitroglycerin which did help her symptoms as well as aspirin. EMS gave the patient 2 doses of Cardizem 10 mg IV push without improvement of her heart rate. Patient's blood sugar noted to be 345  Patient is a 64 y.o. female presenting with atrial fibrillation and chest pain. The history is provided by the patient and medical records.  Atrial Fibrillation Associated symptoms include chest pain.  Chest Pain   Past Medical History  Diagnosis Date  . Hypertension   . Goiter   . Chronic kidney disease   . History of shingles 06/01/2013  . Abnormal EKG 07/31/2013  . GERD (gastroesophageal reflux disease)   . Carpal tunnel syndrome, bilateral   . Coronary artery disease   . Hyperlipidemia   . Anginal pain ([place])   . Asthma   . Pneumonia ~ 1976  . Type II diabetes mellitus (HCC)     insulin dependent  . Arthritis     "hands" (03/06/2015)  . Diabetic peripheral neuropathy ([place]) "since 1996"   Past Surgical History  Procedure Laterality Date  . Tonsillectomy  1976  . Cesarean section  1982; 1984  . Carpal tunnel release Right Nov 2015  . Knee arthroscopy Right ~ 2003    "meniscus repair"  . Shoulder open rotator cuff repair Right 1996; 1998    "w/fracture repair"  . Foot neuroma  surgery Bilateral 2000  . Carpal tunnel release Right 1992; 05/2014    [name]  . Thyroid surgery  2000    "removed lots of nodules"  . Cardiac catheterization N/A 02/27/2015    Procedure: Left Heart Cath and Coronary Angiography;  Surgeon: [name], MD; LAD 40%, mCFX 80%, OM 70%, RCA 100% calcified       . Cardiac catheterization N/A 03/06/2015    Procedure: Coronary Stent Intervention;  Surgeon: [name], MD;  Location: [facility];  Service: Cardiovascular;  Laterality: N/A;  Mid CX 3.50x12 promus DES w/ 0% resdual and Prox OM1 2.50x20 promus DES w/ 20% residual  . Abdominal hysterectomy  1988    age 39; CERVICAL DYSPLASIA; ovaries intact.    Family History  Problem Relation Age of Onset  . Cancer Mother 26    bronchial cancer  . Hypertension Father   . COPD Father   . Heart disease Father 65    CAD with cardiac stenting  . Heart attack Father   . Allergies Sister   . Breast cancer Maternal Grandmother   . Emphysema Maternal Grandfather   . Leukemia Paternal Grandmother   . Emphysema Paternal Grandfather    Social History  Substance Use Topics  . Smoking status: Current Every Day Smoker -- 0.50 packs/day for 41 years    Types: Cigarettes  . Smokeless tobacco: Never Used     Comment: 03/06/2015 "quit smoking  cigarettes ~ 1 wk ago"  . Alcohol Use: No   OB History    No data available     Review of Systems  Cardiovascular: Positive for chest pain.  All other systems reviewed and are negative.     Allergies  Ace inhibitors; Codeine; Iodine; Neosporin; Novolog; Penicillins; and Tape  Home Medications   Prior to Admission medications   Medication Sig Start Date End Date Taking? Authorizing Provider  albuterol (PROVENTIL HFA) 108 (90 BASE) MCG/ACT inhaler Inhale into the lungs every 6 (six) hours as needed for wheezing or shortness of breath.    Historical Provider, MD  aspirin EC 81 MG tablet Take 1 tablet (81 mg total) by mouth daily.  02/25/15   Liliane Shi, PA-C  atorvastatin (LIPITOR) 20 MG tablet Take 1 tablet (20 mg total) by mouth daily. 04/22/15   Liliane Shi, PA-C  buPROPion (WELLBUTRIN SR) 150 MG 12 hr tablet TAKE ONE TABLET BY MOUTH TWICE DAILY 09/28/14   Barton Fanny, MD  clopidogrel (PLAVIX) 75 MG tablet Take 1 tablet (75 mg total) by mouth daily. 02/27/15   [name], MD  cyclobenzaprine (FLEXERIL) 10 MG tablet TAKE ONE TABLET BY MOUTH AT BEDTIME 04/15/15   [name], MD  FLUoxetine (PROZAC) 20 MG capsule TAKE ONE CAPSULE BY MOUTH ONCE DAILY 09/28/14   Barton Fanny, MD  gabapentin (NEURONTIN) 300 MG capsule Three capsules at lunchtime; take three capsules at bedtime 12/06/14   [name], MD  glipiZIDE (GLUCOTROL XL) 5 MG 24 hr tablet Take 1 tablet (5 mg total) by mouth daily with lunch. 03/28/15   [name], MD  Insulin Detemir (LEVEMIR) 100 UNIT/ML Pen Inject 20-25 Units into the skin daily at 10 pm. 09/28/14   Barton Fanny, MD  insulin lispro (HUMALOG) 100 UNIT/ML injection Inject 0.05-0.1 mLs (5-10 Units total) into the skin 3 (three) times daily with meals. 02/27/15   Wendie Agreste, MD  Insulin Pen Needle 32G X 6 MM MISC Use as directed 09/28/14   Barton Fanny, MD  ipratropium (ATROVENT) 0.03 % nasal spray Place 2 sprays into both nostrils 2 (two) times daily. 02/20/15   Tishira R Brewington, PA-C  losartan (COZAAR) 50 MG tablet Take 1 tablet (50 mg total) by mouth daily. 03/13/15   Liliane Shi, PA-C  metFORMIN (GLUCOPHAGE) 1000 MG tablet TAKE ONE TABLET BY MOUTH TWICE DAILY WITH MEALS 09/28/14   Barton Fanny, MD  metoprolol succinate (TOPROL-XL) 50 MG 24 hr tablet Take 1 tablet (50 mg total) by mouth daily. Take with or immediately following a meal. 03/13/15   Liliane Shi, PA-C  nitroGLYCERIN (NITROSTAT) 0.4 MG SL tablet Place 1 tablet (0.4 mg total) under the tongue every 5 (five) minutes as needed for chest pain. 02/25/15   Liliane Shi, PA-C   ondansetron (ZOFRAN) 4 MG tablet Take 1 tablet (4 mg total) by mouth every 8 (eight) hours as needed for nausea or vomiting. 04/13/15   [name], PA-C  pantoprazole (PROTONIX) 40 MG tablet Take 1 tablet (40 mg total) by mouth daily. 03/07/15   Rhonda G Barrett, PA-C  traMADol (ULTRAM) 50 MG tablet Take 1 tablet by mouth in the afternoon and 2 tablets at bedtime. 12/30/14   [name], MD  triamcinolone cream (KENALOG) 0.1 % Apply 1 application topically 2 (two) times daily. May mix 1:1 with eurerin cream at home 04/02/14   [name], PA-C  BP 136/101 mmHg  Pulse 161  Temp(Src) 98.4 F (36.9 C) (Oral)  Resp 22  Ht 5\' 4"  (1.626 m)  Wt 78.472 kg  BMI 29.68 kg/m2  SpO2 100% Physical Exam  Constitutional: She is oriented to person, place, and time. She appears well-developed and well-nourished.  Non-toxic appearance. No distress.  HENT:  Head: Normocephalic and atraumatic.  Eyes: Conjunctivae, EOM and lids are normal. Pupils are equal, round, and reactive to light.  Neck: Normal range of motion. Neck supple. No tracheal deviation present. No thyroid mass present.  Cardiovascular: Normal heart sounds.  An irregularly irregular rhythm present. Tachycardia present.  Exam reveals no gallop.   No murmur heard. Pulmonary/Chest: Effort normal and breath sounds normal. No stridor. No respiratory distress. She has no decreased breath sounds. She has no wheezes. She has no rhonchi. She has no rales.  Abdominal: Soft. Normal appearance and bowel sounds are normal. She exhibits no distension. There is no tenderness. There is no rebound and no CVA tenderness.  Musculoskeletal: Normal range of motion. She exhibits no edema or tenderness.  Neurological: She is alert and oriented to person, place, and time. She has normal strength. No cranial nerve deficit or sensory deficit. GCS eye subscore is 4. GCS verbal subscore is 5. GCS motor subscore is 6.  Skin: Skin is warm and dry. No abrasion and  no rash noted.  Psychiatric: She has a normal mood and affect. Her speech is normal and behavior is normal.  Nursing note and vitals reviewed.   ED Course  Procedures (including critical care time) Labs Review Labs Reviewed  CBC WITH DIFFERENTIAL/PLATELET  COMPREHENSIVE METABOLIC PANEL  BRAIN NATRIURETIC PEPTIDE  TROPONIN I  PROTIME-INR  APTT  T4  TSH    Imaging Review No results found. I have personally reviewed and evaluated these images and lab results as part of my medical decision-making.   EKG Interpretation   Date/Time:  Tuesday April 29 2015 19:26:37 EST Ventricular Rate:  165 PR Interval:    QRS Duration: 81 QT Interval:  307 QTC Calculation: 509 R Axis:   -67 Text Interpretation:  Atrial fibrillation with rapid V-rate Ventricular  premature complex Left anterior fascicular block Low voltage, extremity  leads Anteroseptal infarct, old afib new from prior Confirmed by Roen Macgowan   MD, Kallum Jorgensen ([id number]) on 04/29/2015 7:28:56 PM      MDM   Final diagnoses:  SOB (shortness of breath)    Patient here with chest pain atrial fibrillation. Was given multiple boluses of IV Cardizem with slight improvement of her heart rate. She is mentating appropriately at this time. She has chest pressure free. Will consult cardiology for admission   CRITICAL CARE Performed by: Leota Jacobsen Total critical care time: 45 minutes Critical care time was exclusive of separately billable procedures and treating other patients. Critical care was necessary to treat or prevent imminent or life-threatening deterioration. Critical care was time spent personally by me on the following activities: development of treatment plan with patient and/or surrogate as well as nursing, discussions with consultants, evaluation of patient's response to treatment, examination of patient, obtaining history from patient or surrogate, ordering and performing treatments and interventions, ordering and review  of laboratory studies, ordering and review of radiographic studies, pulse oximetry and re-evaluation of patient's condition.     Lacretia Leigh, MD 04/29/15 [phone]

## 2015-04-29 NOTE — Progress Notes (Signed)
Notified by the emergency department that the patient converted spontaneously to sinus rhythm. We'll continue with anticoagulation and transition to oral Cardizem for rate control. If she remains in sinus rhythm we will anticipate discharge early in the morning.  Pixie Casino, MD, [facility] Attending Cardiologist [place]

## 2015-04-30 ENCOUNTER — Telehealth: Payer: Self-pay

## 2015-04-30 DIAGNOSIS — I4891 Unspecified atrial fibrillation: Secondary | ICD-10-CM | POA: Diagnosis not present

## 2015-04-30 DIAGNOSIS — I10 Essential (primary) hypertension: Secondary | ICD-10-CM | POA: Diagnosis not present

## 2015-04-30 DIAGNOSIS — R195 Other fecal abnormalities: Secondary | ICD-10-CM | POA: Diagnosis not present

## 2015-04-30 DIAGNOSIS — I2510 Atherosclerotic heart disease of native coronary artery without angina pectoris: Secondary | ICD-10-CM | POA: Diagnosis not present

## 2015-04-30 DIAGNOSIS — E0590 Thyrotoxicosis, unspecified without thyrotoxic crisis or storm: Secondary | ICD-10-CM | POA: Diagnosis not present

## 2015-04-30 LAB — BASIC METABOLIC PANEL
Anion gap: 11 (ref 5–15)
BUN: 11 mg/dL (ref 6–20)
CHLORIDE: 103 mmol/L (ref 101–111)
CO2: 23 mmol/L (ref 22–32)
CREATININE: 0.59 mg/dL (ref 0.44–1.00)
Calcium: 8.8 mg/dL — ABNORMAL LOW (ref 8.9–10.3)
GFR calc non Af Amer: >60 mL/min (ref >60)
GLUCOSE: 215 mg/dL — AB (ref 65–99)
Potassium: 3.8 mmol/L (ref 3.5–5.1)
Sodium: 137 mmol/L (ref 135–145)

## 2015-04-30 LAB — GLUCOSE, CAPILLARY
GLUCOSE-CAPILLARY: 212 mg/dL — AB (ref 65–99)
GLUCOSE-CAPILLARY: 207 mg/dL — AB (ref 65–99)
GLUCOSE-CAPILLARY: 206 mg/dL — AB (ref 65–99)
Glucose-Capillary: 167 mg/dL — ABNORMAL HIGH (ref 65–99)

## 2015-04-30 LAB — T4, FREE: Free T4: 1.16 ng/dL — ABNORMAL HIGH (ref 0.61–1.12)

## 2015-04-30 MED ORDER — INSULIN DETEMIR 100 UNIT/ML ~~LOC~~ SOLN
25.0000 [IU] | Freq: Every day | SUBCUTANEOUS | Status: DC
  Administered 2015-04-30: 25 [IU] via SUBCUTANEOUS
  Filled 2015-04-30 (×2): qty 0.25

## 2015-04-30 NOTE — Progress Notes (Signed)
[facility] Consult: 4:04 PM 04/30/2015  LOS: 1 day    Referring Provider: Dr Radford Pax  Primary Care Physician:  Reginia Forts, MD Primary Gastroenterologist:  Althia Forts, set for first OV with Carlean Purl in 06/2015    Reason for Consultation:  FOBT + stool   HPI: Judith [name] is a 64 y.o. female.  Type 2 DM, IDDM.  CKD.   History GERD and colon polyps. Thick gastric wall vs underdistention, prominent appendix, fatty liver, non-enlarged small lymph nodes in porta hepatis, colon diverticulosis on CT 02/2015.   Previous history of colon polyps that sound like they were probably adenomatous. She's undergone 3 colonoscopies altogether, the latest was about 2011. On the first 2 colonoscopies she had polyps removed, there were no recurrent polyps at the third colonoscopy. Dating back 42 or so years ago, the patient had esophageal strictures dilated on 2 occasions but hasn't had upper endoscopy since the early 1990s and only occasionally has solid dysphagia which she relates to perpetually dry mouth.  Reflux symptoms are well controlled with daily PPI. All the previous upper endoscopies and colonoscopies were performed at the [facility] in [place], [name] by a Dr Sherron Ales.    CAD.  S/p 02/2015 cardiac DES stents x 2. Discharge on 81 ASA and Plavix  As part of routine medical care, she submitted stool for FOBT testing about 3 weeks ago. Her primary care doctor discovered she had heme-positive stool and the patient was set to see Dr Carlean Purl in 06/2015.  The patient has not had blood per rectum, melena, black stools. Patient previously tended to have loose stools and in the past few weeks the stools are occasionally more constipated. Her appetite is excellent. Her weight is stable within 5 pounds. She doesn't take NSAIDs  other than the 81 mg aspirin. She doesn't consume alcoholic beverages and never was a heavy drinker.   In October she underwent CT scan because she was having abdominal pain and nausea. This resolved over the course of 1-2 weeks.  Findings on CT scan are discussed in the first paragraph above. She denies history of anemia or of requirements for iron, B12, folate supplements. She's never been transfused with blood products. GI family history notable for diverticulosis/iritis but no colon or other intestinal or gastric cancers.  Admitted 12/20 with Afib /RVR. Her symptoms were dyspnea, some nausea, neck discomfort and dizziness. Eliquis initiated and has converted, with cardizem, to sinus rhythm.   Aspirin discontinued but Plavix is ongoing. Hemoglobin baseline was ~11.5 to 12.5 in 11/2014.  Today it is 68 but that is stable compared with late October through early December.    Past Medical History  Diagnosis Date  . Hypertension   . Goiter   . Chronic kidney disease   . History of shingles 06/01/2013  . Abnormal EKG 07/31/2013  . GERD (gastroesophageal reflux disease)   . Carpal tunnel syndrome, bilateral   . Coronary artery disease   . Hyperlipidemia   . Anginal pain ([place])   . Asthma   . Pneumonia ~ 1976  . Type  II diabetes mellitus (HCC)     insulin dependent  . Arthritis     "hands" (03/06/2015)  . Diabetic peripheral neuropathy ([place]) "since 1996"  . Atrial fibrillation with RVR ([place]) 04/29/2015    Past Surgical History  Procedure Laterality Date  . Tonsillectomy  1976  . Cesarean section  1982; 1984  . Carpal tunnel release Right Nov 2015  . Knee arthroscopy Right ~ 2003    "meniscus repair"  . Shoulder open rotator cuff repair Right 1996; 1998    "w/fracture repair"  . Foot neuroma surgery Bilateral 2000  . Carpal tunnel release Right 1992; 05/2014    [name]; [place]  . Thyroid surgery  2000    "removed lots of nodules"  . Cardiac catheterization N/A 02/27/2015     Procedure: Left Heart Cath and Coronary Angiography;  Surgeon: [name], MD; LAD 40%, mCFX 80%, OM 70%, RCA 100% calcified       . Cardiac catheterization N/A 03/06/2015    Procedure: Coronary Stent Intervention;  Surgeon: [name], MD;  Location: [facility];  Service: Cardiovascular;  Laterality: N/A;  Mid CX 3.50x12 promus DES w/ 0% resdual and Prox OM1 2.50x20 promus DES w/ 20% residual  . Abdominal hysterectomy  1988    age 12; CERVICAL DYSPLASIA; ovaries intact.     Prior to Admission medications   Medication Sig Start Date End Date Taking? Authorizing Provider  albuterol (PROVENTIL HFA) 108 (90 BASE) MCG/ACT inhaler Inhale into the lungs every 6 (six) hours as needed for wheezing or shortness of breath.   Yes Historical Provider, MD  aspirin EC 81 MG tablet Take 1 tablet (81 mg total) by mouth daily. 02/25/15  Yes Scott Joylene Draft, PA-C  atorvastatin (LIPITOR) 20 MG tablet Take 1 tablet (20 mg total) by mouth daily. 04/22/15  Yes Liliane Shi, PA-C  buPROPion (WELLBUTRIN SR) 150 MG 12 hr tablet TAKE ONE TABLET BY MOUTH TWICE DAILY 09/28/14  Yes Barton Fanny, MD  clopidogrel (PLAVIX) 75 MG tablet Take 1 tablet (75 mg total) by mouth daily. 02/27/15  Yes [name], MD  cyclobenzaprine (FLEXERIL) 10 MG tablet TAKE ONE TABLET BY MOUTH AT BEDTIME Patient taking differently: TAKE ONE TABLET BY MOUTH AS NEEDED FOR MUSCLE SPASMS 04/15/15  Yes [name], MD  FLUoxetine (PROZAC) 20 MG capsule TAKE ONE CAPSULE BY MOUTH ONCE DAILY 09/28/14  Yes Barton Fanny, MD  gabapentin (NEURONTIN) 300 MG capsule Three capsules at lunchtime; take three capsules at bedtime Patient taking differently: Take 600-900 mg by mouth 2 (two) times daily. TAKES 2 CAPS IN AM AND 3 CAPS AT BEDTIME 12/06/14  Yes [name], MD  Insulin Detemir (LEVEMIR) 100 UNIT/ML Pen Inject 20-25 Units into the skin daily at 10 pm. 09/28/14  Yes Barton Fanny, MD  insulin lispro (HUMALOG) 100  UNIT/ML injection Inject 0.05-0.1 mLs (5-10 Units total) into the skin 3 (three) times daily with meals. Patient taking differently: Inject 3-5 Units into the skin 3 (three) times daily with meals.  02/27/15  Yes Wendie Agreste, MD  ipratropium (ATROVENT) 0.03 % nasal spray Place 2 sprays into both nostrils 2 (two) times daily. 02/20/15  Yes Tishira R Brewington, PA-C  losartan (COZAAR) 50 MG tablet Take 1 tablet (50 mg total) by mouth daily. 03/13/15  Yes Liliane Shi, PA-C  metFORMIN (GLUCOPHAGE) 1000 MG tablet TAKE ONE TABLET BY MOUTH TWICE DAILY WITH MEALS 09/28/14  Yes Barton Fanny, MD  metoprolol  succinate (TOPROL-XL) 50 MG 24 hr tablet Take 1 tablet (50 mg total) by mouth daily. Take with or immediately following a meal. 03/13/15  Yes Scott T Kathlen Mody, PA-C  nitroGLYCERIN (NITROSTAT) 0.4 MG SL tablet Place 1 tablet (0.4 mg total) under the tongue every 5 (five) minutes as needed for chest pain. 02/25/15  Yes Scott T Kathlen Mody, PA-C  ondansetron (ZOFRAN) 4 MG tablet Take 1 tablet (4 mg total) by mouth every 8 (eight) hours as needed for nausea or vomiting. 04/13/15  Yes [name], PA-C  pantoprazole (PROTONIX) 40 MG tablet Take 1 tablet (40 mg total) by mouth daily. 03/07/15  Yes Rhonda G Barrett, PA-C  traMADol (ULTRAM) 50 MG tablet Take 1 tablet by mouth in the afternoon and 2 tablets at bedtime. Patient taking differently: Take 100 mg by mouth at bedtime.  12/30/14  Yes [name], MD  triamcinolone cream (KENALOG) 0.1 % Apply 1 application topically 2 (two) times daily. May mix 1:1 with eurerin cream at home 04/02/14  Yes [name], PA-C  glipiZIDE (GLUCOTROL XL) 5 MG 24 hr tablet Take 1 tablet (5 mg total) by mouth daily with lunch. 03/28/15   [name], MD  Insulin Pen Needle 32G X 6 MM MISC Use as directed 09/28/14   Barton Fanny, MD    Scheduled Meds: . apixaban  5 mg Oral BID  . atorvastatin  20 mg Oral Daily  . buPROPion  150 mg Oral BID  . clopidogrel   75 mg Oral Daily  . diltiazem  240 mg Oral Daily  . FLUoxetine  20 mg Oral Daily  . insulin aspart  0-15 Units Subcutaneous TID WC  . insulin detemir  25 Units Subcutaneous Q2200  . ipratropium  2 spray Each Nare BID  . losartan  50 mg Oral Daily  . metFORMIN  1,000 mg Oral BID WC  . metoprolol succinate  50 mg Oral Daily  . pantoprazole  40 mg Oral Daily  . traMADol  100 mg Oral QHS   Infusions:   PRN Meds: acetaminophen, albuterol, ondansetron (ZOFRAN) IV   Allergies as of 04/29/2015 - Review Complete 04/29/2015  Allergen Reaction Noted  . Novolog [insulin aspart] Shortness Of Breath and Other (See Comments) 02/27/2015  . Codeine Nausea And Vomiting and Other (See Comments) 09/28/2013  . Iodine Other (See Comments) 01/30/2013  . Ace inhibitors Cough 03/13/2015  . Neosporin [neomycin-bacitracin zn-polymyx] Itching and Rash 01/30/2013  . Penicillins Itching, Rash, and Other (See Comments) 01/30/2013  . Tape Itching and Rash 01/30/2013    Family History  Problem Relation Age of Onset  . Cancer Mother 4    bronchial cancer  . Hypertension Father   . COPD Father   . Heart disease Father 54    CAD with cardiac stenting  . Heart attack Father   . Allergies Sister   . Breast cancer Maternal Grandmother   . Emphysema Maternal Grandfather   . Leukemia Paternal Grandmother   . Emphysema Paternal Grandfather     Social History   Social History  . Marital Status: Divorced    Spouse Name: N/A  . Number of Children: N/A  . Years of Education: N/A   Occupational History  . Food Therapist, nutritional    Social History Main Topics  . Smoking status: Former Smoker -- 0.50 packs/day for 41 years    Types: Cigarettes  . Smokeless tobacco: Never Used     Comment: 04/29/2015 "quit smoking cigarettes 02/27/2015"  .  Alcohol Use: No  . Drug Use: No  . Sexual Activity: Not Currently    Birth Control/ Protection: Post-menopausal, Surgical   Other Topics Concern  . 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      Lives with sister in [place].      Tobacco: 1 ppd x 41 years      Alcohol: none      Drugs: none      Exercise:  Walking in neighborhood; physical job.       REVIEW OF SYSTEMS: Constitutional:  No weight loss, no fatigue, no falls ENT:  No nose bleeds Pulm:  + SOB, no cough CV:  + palpitations, no LE edema. No chest pain.  GU:  No hematuria, no frequency GI:  Per HPI Heme:  No unusual bleeding or bruising.   Transfusions:  None ever Neuro:  No headaches, no peripheral tingling or numbness Derm:  No itching, no rash or sores.  Endocrine:  No sweats or chills.  No polyuria or dysuria Immunization:  Not queried.  Travel:  None beyond local counties in last few months.    PHYSICAL EXAM: Vital signs in last 24 hours: Filed Vitals:   04/30/15 0028 04/30/15 0500  BP: 137/63 166/81  Pulse:  85  Temp:  97.8 F (36.6 C)  Resp:  18   Wt Readings from Last 3 Encounters:  04/29/15 78.2 kg (172 lb 6.4 oz)  04/29/15 78.472 kg (173 lb)  04/13/15 76.93 kg (169 lb 9.6 oz)    General: Doesn't, somewhat tired appearing, comfortable Head:  No facial asymmetry or swelling.  Eyes:  No conjunctival pallor. Ears:  Not hard of hearing.  Nose:  No discharge, no congestion Mouth:  Clear, moist, dentition in good repair though some molars are missing. Neck:  No JVD, no TMG, no masses. Lungs:  Clear to auscultation and percussion bilaterally. No shortness of breath or cough. Heart: RRR. No MRG. S1/S2 audible. Abdomen:  Soft. NT. ND. No HSM or masses. Bowel sounds active. No bruits..   Rectal: Deferred.   Musc/Skeltl: No joint erythema, swelling or contracture deformities. Extremities:  No pedal edema. Feet are warm and well  perfuse.  Neurologic:  Alert. Oriented 3. Moves all 4 limbs, strength not tested. Skin:  No telangiectasia no rashes. No sores. Tattoos:  None Nodes:  No cervical or inguinal adenopathy.   Psych:  Calm, cooperative  Intake/Output from previous day: 12/20 0701 - 12/21 0700 In: -  Out: 250 [Urine:250] Intake/Output this shift: Total I/O In: 240 [P.O.:240] Out: -   LAB RESULTS:  Recent Labs  04/29/15 1814  WBC 9.2  HGB 11.0*  HCT 32.5*  PLT 322   BMET Lab Results  Component Value Date   NA 137 04/30/2015   NA 136 04/29/2015   NA 135 04/21/2015   K 3.8 04/30/2015   K 4.1 04/29/2015   K 4.6 04/21/2015   CL 103 04/30/2015   CL 102 04/29/2015   CL 97* 04/21/2015   CO2 23 04/30/2015   CO2 23 04/29/2015   CO2 29 04/21/2015   GLUCOSE 215* 04/30/2015   GLUCOSE 349* 04/29/2015   GLUCOSE 188* 04/21/2015   BUN 11 04/30/2015   BUN 11 04/29/2015  BUN 18 04/21/2015   CREATININE 0.59 04/30/2015   CREATININE 0.71 04/29/2015   CREATININE 0.69 04/21/2015   CALCIUM 8.8* 04/30/2015   CALCIUM 9.1 04/29/2015   CALCIUM 8.5* 04/21/2015   LFT  Recent Labs  04/29/15 1814  PROT 6.1*  ALBUMIN 3.1*  AST 18  ALT 12*  ALKPHOS 111  BILITOT 0.2*   PT/INR Lab Results  Component Value Date   INR 1.01 04/29/2015   INR 0.98 02/25/2015   Hepatitis Panel No results for input(s): HEPBSAG, HCVAB, HEPAIGM, HEPBIGM in the last 72 hours. C-Diff No components found for: CDIFF Lipase  No results found for: LIPASE  Drugs of Abuse  No results found for: LABOPIA, COCAINSCRNUR, LABBENZ, AMPHETMU, THCU, LABBARB   RADIOLOGY STUDIES: Dg Chest 2 View  04/29/2015  CLINICAL DATA:  Chest pain and shortness of breath for 1 day, hypertension, coronary artery disease, smoker, type II diabetes mellitus, asthma EXAM: CHEST  2 VIEW COMPARISON:  02/12/2015 FINDINGS: Minimal enlargement of cardiac silhouette. Coronary arterial calcification versus stent. Mediastinal contours and pulmonary  vascularity normal. Bronchitic changes and hyperinflation consistent with COPD. No acute infiltrate, pleural effusion or pneumothorax. Bones demineralized. IMPRESSION: COPD changes. Minimal enlargement of cardiac silhouette. No acute abnormalities. Electronically Signed   By: Lavonia Dana M.D.   On: 04/29/2015 19:08    ENDOSCOPIC STUDIES: Per HPI  IMPRESSION:   *  FOBT positive stool. No overt melena or bleeding per rectum.  *  Previous history of colon polyps on the first 2 of 3 [place]. Diverticulosis evident by CT scan of 02/2015.  *  GERD history and previous dilatation of esophageal strictures. Last dilation occurred in the early 1990s.  GER symptoms well controlled with daily Protonix.  *  New onset atrial fib with RVR, converted with Cardizem to sinus rhythm.  Ellik was just initiated.  *  10/ 27/2016 placement of 2 drug-eluting stents.  On Plavix since then.    PLAN:     *  Per Dr Silverio Decamp.     Azucena Freed  04/30/2015, 4:04 PM Pager: [phone]      Attending physician's note   I have taken a history, examined the patient and reviewed the chart. I agree with the Advanced Practitioner's note, impression and recommendations. 59 -year-old female with history of CAD status post coronary stents in October 2016 on aspirin and Plavix, admitted with new onset A. fib with RVR converted to sinus rhythm with Cardizem and initiated on Eliquis during this admission. We were consulted for heme positive stool done as outpatient about 2-3 weeks ago, patient denies any overt GI bleed. Her last colonoscopy was in 2011, normal per patient and and was asked to have a recall colonoscopy in 10 years.  Her hemoglobin is slightly lower at 11 compared to her baseline of 12 in the past few months. On CT abdomen there was some gastric wall thickening but most likely due to under distention. Would prefer to schedule the EGD and colonoscopy when safe for patient to  hold anticoagulation and Plavix for a few days given lack of overt GI bleed and symptoms. Discussed the above plan in detail with patient and she is in agreement. She will follow up with [place] as outpatient and will also try to obtain the records of her prior EGD and colonoscopy in the interim.   Damaris Hippo, MD [phone] Mon-Fri 8a-5p [phone] after 5p, weekends, holidays

## 2015-04-30 NOTE — Care Management Note (Signed)
Case Management Note  Patient Details  Name: Judith [name] MRN: [id number] Date of Birth: 1951-01-03  Subjective/Objective:        Pt admitted with a fib RVR            Action/Plan:  Pt is independent from home with sister.  Pt will discharge home on Eliquis, CM will assist with medication post discharge   Expected Discharge Date:                  Expected Discharge Plan:  Home/Self Care  In-House Referral:     Discharge planning Services  CM Consult, Medication Assistance  Post Acute Care Choice:    Choice offered to:     DME Arranged:    DME Agency:     HH Arranged:    HH Agency:     Status of Service:  Completed, signed off  Medicare Important Message Given:    Date Medicare IM Given:    Medicare IM give by:    Date Additional Medicare IM Given:    Additional Medicare Important Message give by:     If discussed at [place] of Stay Meetings, dates discussed:    Additional Comments: CM assessed pt.  CM verified with pt that she doesn't have medicaid nor medicare.  CM provided both 30 day free Eliquis card and reduced copay card to pt.  Pt stated preferred pharmacy to be [facility] on [address], [place] contacted pharmacy and was informed that medication can be filled today.  No other CM needs determined prior to discharge. Maryclare Labrador, RN 04/30/2015, 12:01 PM

## 2015-04-30 NOTE — Telephone Encounter (Signed)
Patient is calling to let Dr. Tamala Julian know that she has been put on two different medication. Patient will call back with the medication names

## 2015-04-30 NOTE — Progress Notes (Signed)
SUBJECTIVE:  No complaints  OBJECTIVE:   Vitals:   Filed Vitals:   04/29/15 2132 04/29/15 2254 04/30/15 0028 04/30/15 0500  BP:  135/70 137/63 166/81  Pulse:  85  85  Temp: 98.3 F (36.8 C) 98 F (36.7 C)  97.8 F (36.6 C)  TempSrc: Oral Oral  Oral  Resp:  18  18  Height:  5\' 4"  (1.626 m)    Weight:  172 lb 6.4 oz (78.2 kg)    SpO2:  97%  98%   I&O's:   Intake/Output Summary (Last 24 hours) at 04/30/15 0845 Last data filed at 04/29/15 1956  Gross per 24 hour  Intake      0 ml  Output    250 ml  Net   -250 ml   TELEMETRY: Reviewed telemetry pt in NSR:     PHYSICAL EXAM General: Well developed, well nourished, in no acute distress Head: Eyes PERRLA, No xanthomas.   Normal cephalic and atramatic  Lungs:   Clear bilaterally to auscultation and percussion. Heart:   HRRR S1 S2 Pulses are 2+ & equal. Abdomen: Bowel sounds are positive, abdomen soft and non-tender without masses Extremities:   No clubbing, cyanosis or edema.  DP +1 Neuro: Alert and oriented X 3. Psych:  Good affect, responds appropriately   LABS: Basic Metabolic Panel:  Recent Labs  04/29/15 1814 04/30/15 0429  NA 136 137  K 4.1 3.8  CL 102 103  CO2 23 23  GLUCOSE 349* 215*  BUN 11 11  CREATININE 0.71 0.59  CALCIUM 9.1 8.8*   Liver Function Tests:  Recent Labs  04/29/15 1814  AST 18  ALT 12*  ALKPHOS 111  BILITOT 0.2*  PROT 6.1*  ALBUMIN 3.1*   No results for input(s): LIPASE, AMYLASE in the last 72 hours. CBC:  Recent Labs  04/29/15 1814  WBC 9.2  NEUTROABS 5.9  HGB 11.0*  HCT 32.5*  MCV 86.4  PLT 322   Cardiac Enzymes:  Recent Labs  04/29/15 1814  TROPONINI <0.03   BNP: Invalid input(s): POCBNP D-Dimer: No results for input(s): DDIMER in the last 72 hours. Hemoglobin A1C: No results for input(s): HGBA1C in the last 72 hours. Fasting Lipid Panel: No results for input(s): CHOL, HDL, LDLCALC, TRIG, CHOLHDL, LDLDIRECT in the last 72 hours. Thyroid Function  Tests:  Recent Labs  04/29/15 1814  TSH 0.252*   Anemia Panel: No results for input(s): VITAMINB12, FOLATE, FERRITIN, TIBC, IRON, RETICCTPCT in the last 72 hours. Coag Panel:   Lab Results  Component Value Date   INR 1.01 04/29/2015   INR 0.98 02/25/2015    RADIOLOGY: Ct Abdomen Pelvis Wo Contrast  04/13/2015  CLINICAL DATA:  Right lower quadrant abdominal pain for 2 weeks. No fever or leukocytosis. Nausea and vomiting. Evaluate for appendicitis. EXAM: CT ABDOMEN AND PELVIS WITHOUT CONTRAST TECHNIQUE: Multidetector CT imaging of the abdomen and pelvis was performed following the standard protocol without IV contrast. COMPARISON:  None. FINDINGS: Lower chest: Clear lung bases. No significant pleural or pericardial effusion. Atherosclerosis of the aorta and coronary arteries. Possible aortic valvular calcifications. Hepatobiliary: The liver demonstrates diffusely decreased density consistent with steatosis. No focal lesions observed on noncontrast imaging. No evidence of gallstones, gallbladder wall thickening or biliary dilatation. Pancreas: Atrophied without focal abnormality, ductal dilatation or surrounding inflammation. Spleen: Normal in size without focal abnormality. Adrenals/Urinary Tract: Both adrenal glands appear normal. The kidneys appear normal without evidence of urinary tract calculus, suspicious lesion or hydronephrosis. No bladder  abnormalities are seen. Stomach/Bowel: The stomach is poorly distended and the enteric contrast has largely drained from it. There is possible wall thickening of the proximal to mid stomach. There is a diverticulum of the second portion of the duodenum. The appendix is mildly prominent, measuring up to 8 mm in diameter. It demonstrates no wall thickening or surrounding inflammation, and this is consistent with a normal variant. There is gas within its lumen. There are diverticular changes of the sigmoid colon without surrounding inflammation.  Vascular/Lymphatic: Small lymph nodes within the porta hepatis and retroperitoneum are not pathologically enlarged. There is atherosclerosis of the aorta, its branches and the iliac arteries. Reproductive: Hysterectomy.  No evidence of adnexal mass. Other: No evidence of abdominal wall mass or hernia. Musculoskeletal: No acute or significant osseous findings. Asymmetric degenerative changes of the right hip. IMPRESSION: 1. No evidence of acute appendicitis or other definite acute findings in the abdomen or pelvis. 2. Possible wall thickening of the proximal to mid stomach. This could be secondary to incomplete distension and is suboptimally evaluated by this examination. Consider outpatient evaluation with endoscopy or upper GI series. 3. 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 4. Hepatic steatosis and atherosclerosis noted. Electronically Signed   By: Richardean Sale M.D.   On: 04/13/2015 13:04   Dg Chest 2 View  04/29/2015  CLINICAL DATA:  Chest pain and shortness of breath for 1 day, hypertension, coronary artery disease, smoker, type II diabetes mellitus, asthma EXAM: CHEST  2 VIEW COMPARISON:  02/12/2015 FINDINGS: Minimal enlargement of cardiac silhouette. Coronary arterial calcification versus stent. Mediastinal contours and pulmonary vascularity normal. Bronchitic changes and hyperinflation consistent with COPD. No acute infiltrate, pleural effusion or pneumothorax. Bones demineralized. IMPRESSION: COPD changes. Minimal enlargement of cardiac silhouette. No acute abnormalities. Electronically Signed   By: Lavonia Dana M.D.   On: 04/29/2015 19:08    Assessment/Plan Principal Problem:  Atrial fibrillation with RVR (HCC) Active Problems:  Diabetes (HCC)  HTN (hypertension)  Coronary artery disease involving native coronary artery of native heart without angina pectoris   1. Atrial fibrillation with rapid ventricular response - Judith [name] had the onset of palpitations  yesterday at exactly 3:45 PM. She was watching TV and she felt her heart racing and consider taking nitroglycerin but instead called EMS. On admission she was noted to be in A. fib with RVR at a rapid rate and had improvement in ventricular response with IV Cardizem. She recently was found to have obstructive coronary artery disease and had stents placed, therefore she is on aspirin and Plavix. This patients CHA2DS2-VASc Score and unadjusted Ischemic Stroke Rate (% per year) is equal to 4.8 % stroke rate/year from a score of 4 (Above score calculated as 1 point each if present [CHF, HTN, DM, Vascular=MI/PAD/Aortic Plaque, Age if 65-74, or Female] Above score calculated as 2 points each if present [Age > 75, or Stroke/TIA/TE]. . As her stents were placed in October,  Aspirin was stopped and continued on Plavix and started on Eliquis 5 mg twice a day. She did mention that she's had some guaiac positive stools recently, therefore we will need to watch her closely for the possibility of rectal bleeding. As there is no acute bleeding, I suspect GI will not want to see her as an inpatient. She does have an outpatient appointment scheduled in February. She converted to NSR overnight and has been maintaining that since.  Recent 2D echo with normal LVF.    2. Hypertension- Appears controlled. Continue  her home Toprol XL and Cozaar.  3. Type 2 diabetes- continue home antidiabetic medications.  4. Suppressed TSH- TSH is 0.252. T4 elevated c/w hyperthyroidism.  Will ask hospitalist to see. Hopefully she will be able to go home today once seen by them.    5. Dyslipidemia- well-controlled. Continue current dose Lipitor.  Patient is stable for d/c home once seen by hospitalist for hyperthyroidism  Sueanne Margarita, MD  04/30/2015  8:45 AM

## 2015-04-30 NOTE — Discharge Instructions (Addendum)

## 2015-04-30 NOTE — Discharge Summary (Signed)
Discharge Summary   Patient ID: Judith [name],  MRN: [id number], DOB/AGE: 62/13/1952 62 y.o.  Admit date: 04/29/2015 Discharge date: 05/01/2015  Primary Care Provider: [place] Primary Cardiologist: Dr. Radford Pax  Discharge Diagnoses Principal Problem:   Atrial fibrillation with RVR [facility]) Active Problems:   Diabetes ([place])   HTN (hypertension)   Coronary artery disease involving native coronary artery of native heart without angina pectoris   Hyperthyroidism   Tobacco abuse  Allergies Allergies  Allergen Reactions  . Novolog [Insulin Aspart] Shortness Of Breath and Other (See Comments)    "breathing problems"  . Codeine Nausea And Vomiting and Other (See Comments)    HIGH DOSES-SEVERE VOMITING  . Iodine Other (See Comments)    MUST HAVE BENADRYL PRIOR TO PROCEDURE AND RIGHT BEFORE TREATMENT TO COUNTERACT REACTION-BLISTERING REACTION DERMATOLOGICAL  . Ace Inhibitors Cough  . Neosporin [Neomycin-Bacitracin Zn-Polymyx] Itching and Rash    MAKES REACTIONS WORSE WHEN USING AS PROPHYLACTIC  . Penicillins Itching, Rash and Other (See Comments)    CHEST SIZED RASH AND ITCHING   . Tape Itching and Rash    Consultant: None  Procedures None  History of Present Illness  The patient is a 64 yo female with a hx of diabetes, HTN, tobacco abuse. She was evaluated by Dr. Radford Pax  4/15 for chest pain. Echo demonstrated normal LVF. Nuclear stress test was cancelled when her symptoms improved with PPI therapy. Last seen by Dr. Fransico Him 4/15. Admitted 10/5-10/6 with chest pain in the setting of UTI and viral gastroenteritis complicated by AKI and hypoNa. She was evaluated by Cardiology. CEs remained neg. Echo demonstrated mod LVH, EF 60-65%, no RWMA, Gr 1 DD, MAC. OP stress test was recommended. This was done 02/21/15 and demonstrated EF 60% with inf-lat and apical lateral ischemia (intermediate risk).  She underwent a cardiac cath on 02/27/15 which  demonstrated 2 v CAD with CTO of the RCA and high grade bifurcational LCx/OM stenosis. She was brought back for PCI and underwent DES x 2 to the LCx/OM. She presented 04/29/2015 with A fib RVR. She reported her heart beating out of her chest at 1545hrs this afternoon. She was SOB, and nauseated with some discomfort up near her neck. She was a little dizzy when she stood up for the CXR. She denies vomiting, fever, chest pain, orthopnea, PND, cough, congestion, abdominal pain, hematochezia, melena, lower extremity edema, claudication. She was occult blood positive a couple weeks ago, no gross bleeding, and is scheduled to see GI in February.  On admission she was noted to be in A. fib with RVR at a rapid rate and had improvement in ventricular response with IV Cardizem in ED.   Hospital Course  She converted to NSR overnight and maintained sinus rhythm. Started on Eliquis for anticoagulation for CHA2DS2-VASc Score and unadjusted Ischemic Stroke Rate (% per year) is equal to 4.8 % stroke rate/year from a score of 4. As her stents were placed in October, Aspirin was stopped and continued on Plavix and started on Eliquis 5 mg twice a day. She did mention that she's had some guaiac positive stools recently and she is schedule to see Dr. Carlean Purl Feb 2017. Given initiation of anticoagulation, the patient was seen by GI and felt no overt melena or bleeding per rectum. However prefered to schedule the EGD and colonoscopy as out patient when safe for patient to hold anticoagulation and Plavix for a few days given lack of overt GI bleed and symptoms. She will follow up as outpatient.  Dr. Radford Pax felt that her PCI was 02/27/2015 with DES so need to hold off GI evaluation until at least February and preferably May (9 months out). Will need to monitor hemoglobin closely. Recent 2D echo with normal LVF.   TSH is 0.252. T4 elevated c/w hyperthyroidism. Discussed with IM (no formal consult) and since  it is very mild they recommend following with repeat TSH/T4 in 3 months. Will defer to her PCP and have her followup with PCP in the next 1-2 weeks.   She has been seen by Dr. Radford Pax today and deemed ready for discharge home. All follow-up appointments have been scheduled. Discharge medications are listed below.     Discharge Vitals Blood pressure 158/85, pulse 75, temperature 98.2 F (36.8 C), temperature source Oral, resp. rate 18, height 5\' 4"  (1.626 m), weight 172 lb 6.4 oz (78.2 kg), SpO2 98 %.  Filed Weights   04/29/15 1758 04/29/15 2254  Weight: 173 lb (78.472 kg) 172 lb 6.4 oz (78.2 kg)    Labs  CBC  Recent Labs  04/29/15 1814 05/01/15 0259  WBC 9.2 9.3  NEUTROABS 5.9  --   HGB 11.0* 10.9*  HCT 32.5* 34.3*  MCV 86.4 87.7  PLT 322 [id number]   Basic Metabolic Panel  Recent Labs  04/30/15 0429 05/01/15 0259  NA 137 134*  K 3.8 4.6  CL 103 101  CO2 23 24  GLUCOSE 215* 230*  BUN 11 15  CREATININE 0.59 0.69  CALCIUM 8.8* 9.0   Liver Function Tests  Recent Labs  04/29/15 1814  AST 18  ALT 12*  ALKPHOS 111  BILITOT 0.2*  PROT 6.1*  ALBUMIN 3.1*   No results for input(s): LIPASE, AMYLASE in the last 72 hours. Cardiac Enzymes  Recent Labs  04/29/15 1814  TROPONINI <0.03   Thyroid Function Tests  Recent Labs  04/29/15 1814 04/30/15 0429  TSH 0.252*  --   T3FREE  --  4.5*    Disposition  Pt is being discharged home today in good condition.  Follow-up Plans & Appointments  Follow-up Information    Follow up with SMITH,KRISTI, MD. Schedule an appointment as soon as possible for a visit in 2 weeks.   Specialty:  Family Medicine   Why:  for hyperthyroidism   Contact information:   [place] [id number] [phone]       Follow up with HAGER, BRYAN, PA-C. Go on 05/30/2015.   Specialties:  Physician Assistant, Radiology, Interventional Cardiology   Why:  @3pm  as previously schedule    Contact information:   [address] [place] [address] [phone]       Follow up with Harl Bowie, MD. Schedule an appointment as soon as possible for a visit in 1 week.   Specialty:  Gastroenterology   Contact information:   [place] [address] [phone]           Discharge Instructions    Diet - low sodium heart healthy    Complete by:  As directed      Increase activity slowly    Complete by:  As directed          F/u Labs/Studies: TSH and Free T4 with PCP  Discharge Medications    Medication List    STOP taking these medications        aspirin EC 81 MG tablet      TAKE these medications  apixaban 5 MG Tabs tablet  Commonly known as:  ELIQUIS  Take 1 tablet (5 mg total) by mouth 2 (two) times daily.     atorvastatin 20 MG tablet  Commonly known as:  LIPITOR  Take 1 tablet (20 mg total) by mouth daily.     buPROPion 150 MG 12 hr tablet  Commonly known as:  WELLBUTRIN SR  TAKE ONE TABLET BY MOUTH TWICE DAILY     clopidogrel 75 MG tablet  Commonly known as:  PLAVIX  Take 1 tablet (75 mg total) by mouth daily.     cyclobenzaprine 10 MG tablet  Commonly known as:  FLEXERIL  TAKE ONE TABLET BY MOUTH AT BEDTIME     diltiazem 240 MG 24 hr capsule  Commonly known as:  CARDIZEM CD  Take 1 capsule (240 mg total) by mouth daily.     FLUoxetine 20 MG capsule  Commonly known as:  PROZAC  TAKE ONE CAPSULE BY MOUTH ONCE DAILY     gabapentin 300 MG capsule  Commonly known as:  NEURONTIN  Three capsules at lunchtime; take three capsules at bedtime     glipiZIDE 5 MG 24 hr tablet  Commonly known as:  GLUCOTROL XL  Take 1 tablet (5 mg total) by mouth daily with lunch.     Insulin Detemir 100 UNIT/ML Pen  Commonly known as:  LEVEMIR  Inject 20-25 Units into the skin daily at 10 pm.     insulin lispro 100 UNIT/ML injection  Commonly known as:  HUMALOG  Inject 0.05-0.1 mLs (5-10 Units total) into the skin 3 (three) times daily with meals.       Insulin Pen Needle 32G X 6 MM Misc  Use as directed     ipratropium 0.03 % nasal spray  Commonly known as:  ATROVENT  Place 2 sprays into both nostrils 2 (two) times daily.     losartan 50 MG tablet  Commonly known as:  COZAAR  Take 1 tablet (50 mg total) by mouth daily.     metFORMIN 1000 MG tablet  Commonly known as:  GLUCOPHAGE  TAKE ONE TABLET BY MOUTH TWICE DAILY WITH MEALS     metoprolol succinate 50 MG 24 hr tablet  Commonly known as:  TOPROL-XL  Take 1 tablet (50 mg total) by mouth daily. Take with or immediately following a meal.     nitroGLYCERIN 0.4 MG SL tablet  Commonly known as:  NITROSTAT  Place 1 tablet (0.4 mg total) under the tongue every 5 (five) minutes as needed for chest pain.     ondansetron 4 MG tablet  Commonly known as:  ZOFRAN  Take 1 tablet (4 mg total) by mouth every 8 (eight) hours as needed for nausea or vomiting.     pantoprazole 40 MG tablet  Commonly known as:  PROTONIX  Take 1 tablet (40 mg total) by mouth daily.     PROVENTIL HFA 108 (90 BASE) MCG/ACT inhaler  Generic drug:  albuterol  Inhale into the lungs every 6 (six) hours as needed for wheezing or shortness of breath.     traMADol 50 MG tablet  Commonly known as:  ULTRAM  Take 1 tablet by mouth in the afternoon and 2 tablets at bedtime.     triamcinolone cream 0.1 %  Commonly known as:  KENALOG  Apply 1 application topically 2 (two) times daily. May mix 1:1 with eurerin cream at home        Duration of Discharge Encounter  Greater than 30 minutes including physician time.  Signed, Reynard Christoffersen PA-C 05/01/2015, 9:52 AM

## 2015-04-30 NOTE — Progress Notes (Signed)
The patient has guaiac positive stools recently and schedule to see Dr. Carlean Purl Feb 2017. Given initiation of anticoagulation requested GI consult.   The patient will follow up with PCP for hyperthyroidism.   The patient is aware of the plan and aggress.   Judith [name], [place]

## 2015-05-01 DIAGNOSIS — I4891 Unspecified atrial fibrillation: Secondary | ICD-10-CM | POA: Diagnosis not present

## 2015-05-01 DIAGNOSIS — R195 Other fecal abnormalities: Secondary | ICD-10-CM | POA: Diagnosis not present

## 2015-05-01 DIAGNOSIS — I10 Essential (primary) hypertension: Secondary | ICD-10-CM | POA: Diagnosis not present

## 2015-05-01 DIAGNOSIS — I2510 Atherosclerotic heart disease of native coronary artery without angina pectoris: Secondary | ICD-10-CM | POA: Diagnosis not present

## 2015-05-01 LAB — T3, FREE: T3 FREE: 4.5 pg/mL — AB (ref 2.0–4.4)

## 2015-05-01 LAB — GLUCOSE, CAPILLARY: GLUCOSE-CAPILLARY: 188 mg/dL — AB (ref 65–99)

## 2015-05-01 LAB — BASIC METABOLIC PANEL
Anion gap: 9 (ref 5–15)
BUN: 15 mg/dL (ref 6–20)
CHLORIDE: 101 mmol/L (ref 101–111)
CO2: 24 mmol/L (ref 22–32)
Calcium: 9 mg/dL (ref 8.9–10.3)
Creatinine, Ser: 0.69 mg/dL (ref 0.44–1.00)
GFR calc non Af Amer: >60 mL/min (ref >60)
Glucose, Bld: 230 mg/dL — ABNORMAL HIGH (ref 65–99)
POTASSIUM: 4.6 mmol/L (ref 3.5–5.1)
SODIUM: 134 mmol/L — AB (ref 135–145)

## 2015-05-01 LAB — CBC
HCT: 34.3 % — ABNORMAL LOW (ref 36.0–46.0)
HEMOGLOBIN: 10.9 g/dL — AB (ref 12.0–15.0)
MCH: 27.9 pg (ref 26.0–34.0)
MCHC: 31.8 g/dL (ref 30.0–36.0)
MCV: 87.7 fL (ref 78.0–100.0)
Platelets: 365 10*3/uL (ref 150–400)
RBC: 3.91 MIL/uL (ref 3.87–5.11)
RDW: 13.4 % (ref 11.5–15.5)
WBC: 9.3 10*3/uL (ref 4.0–10.5)

## 2015-05-01 MED ORDER — DILTIAZEM HCL ER COATED BEADS 240 MG PO CP24: 240.0000 mg | ORAL_CAPSULE | Freq: Every day | ORAL | Status: DC

## 2015-05-01 MED ORDER — OFF THE BEAT BOOK
Freq: Once | Status: AC
  Administered 2015-05-01: 09:00:00
  Filled 2015-05-01: qty 1

## 2015-05-01 MED ORDER — APIXABAN 5 MG PO TABS: 5.0000 mg | ORAL_TABLET | Freq: Two times a day (BID) | ORAL | Status: DC

## 2015-05-01 NOTE — Progress Notes (Signed)
Order received to discharge.  Telemetry removed and CCMD notified.  IV removed with catheter intact.  Discharge education given reviewing all documents included with AVS.   Pt indicates understanding.  All questions answered.  Pt denies chest pain or sob.  Denies any heart palpitations or feeling of weakness.  Pt walked with this nurse to exit at front door.  Pt stable at discharge no s/s of distress.

## 2015-05-01 NOTE — Progress Notes (Signed)
SUBJECTIVE:  No complaints  OBJECTIVE:   Vitals:   Filed Vitals:   04/30/15 0028 04/30/15 0500 04/30/15 1943 05/01/15 0501  BP: 137/63 166/81 137/67 158/85  Pulse:  85 70 75  Temp:  97.8 F (36.6 C) 98 F (36.7 C) 98.2 F (36.8 C)  TempSrc:  Oral Oral Oral  Resp:  18 18 18   Height:      Weight:      SpO2:  98% 100% 98%   I&O's:   Intake/Output Summary (Last 24 hours) at 05/01/15 0859 Last data filed at 04/30/15 1630  Gross per 24 hour  Intake    720 ml  Output      0 ml  Net    720 ml   TELEMETRY: Reviewed telemetry pt in NSR:     PHYSICAL EXAM General: Well developed, well nourished, in no acute distress Head: Eyes PERRLA, No xanthomas.   Normal cephalic and atramatic  Lungs:   Clear bilaterally to auscultation and percussion. Heart:   HRRR S1 S2 Pulses are 2+ & equal. Abdomen: Bowel sounds are positive, abdomen soft and non-tender without masses  Extremities:   No clubbing, cyanosis or edema.  DP +1 Neuro: Alert and oriented X 3. Psych:  Good affect, responds appropriately   LABS: Basic Metabolic Panel:  Recent Labs  04/30/15 0429 05/01/15 0259  NA 137 134*  K 3.8 4.6  CL 103 101  CO2 23 24  GLUCOSE 215* 230*  BUN 11 15  CREATININE 0.59 0.69  CALCIUM 8.8* 9.0   Liver Function Tests:  Recent Labs  04/29/15 1814  AST 18  ALT 12*  ALKPHOS 111  BILITOT 0.2*  PROT 6.1*  ALBUMIN 3.1*   No results for input(s): LIPASE, AMYLASE in the last 72 hours. CBC:  Recent Labs  04/29/15 1814 05/01/15 0259  WBC 9.2 9.3  NEUTROABS 5.9  --   HGB 11.0* 10.9*  HCT 32.5* 34.3*  MCV 86.4 87.7  PLT 322 365   Cardiac Enzymes:  Recent Labs  04/29/15 1814  TROPONINI <0.03   BNP: Invalid input(s): POCBNP D-Dimer: No results for input(s): DDIMER in the last 72 hours. Hemoglobin A1C: No results for input(s): HGBA1C in the last 72 hours. Fasting Lipid Panel: No results for input(s): CHOL, HDL, LDLCALC, TRIG, CHOLHDL, LDLDIRECT in the last 72  hours. Thyroid Function Tests:  Recent Labs  04/29/15 1814 04/30/15 0429  TSH 0.252*  --   T3FREE  --  4.5*   Anemia Panel: No results for input(s): VITAMINB12, FOLATE, FERRITIN, TIBC, IRON, RETICCTPCT in the last 72 hours. Coag Panel:   Lab Results  Component Value Date   INR 1.01 04/29/2015   INR 0.98 02/25/2015    RADIOLOGY: Ct Abdomen Pelvis Wo Contrast  04/13/2015  CLINICAL DATA:  Right lower quadrant abdominal pain for 2 weeks. No fever or leukocytosis. Nausea and vomiting. Evaluate for appendicitis. EXAM: CT ABDOMEN AND PELVIS WITHOUT CONTRAST TECHNIQUE: Multidetector CT imaging of the abdomen and pelvis was performed following the standard protocol without IV contrast. COMPARISON:  None. FINDINGS: Lower chest: Clear lung bases. No significant pleural or pericardial effusion. Atherosclerosis of the aorta and coronary arteries. Possible aortic valvular calcifications. Hepatobiliary: The liver demonstrates diffusely decreased density consistent with steatosis. No focal lesions observed on noncontrast imaging. No evidence of gallstones, gallbladder wall thickening or biliary dilatation. Pancreas: Atrophied without focal abnormality, ductal dilatation or surrounding inflammation. Spleen: Normal in size without focal abnormality. Adrenals/Urinary Tract: Both adrenal glands appear normal. The  kidneys appear normal without evidence of urinary tract calculus, suspicious lesion or hydronephrosis. No bladder abnormalities are seen. Stomach/Bowel: The stomach is poorly distended and the enteric contrast has largely drained from it. There is possible wall thickening of the proximal to mid stomach. There is a diverticulum of the second portion of the duodenum. The appendix is mildly prominent, measuring up to 8 mm in diameter. It demonstrates no wall thickening or surrounding inflammation, and this is consistent with a normal variant. There is gas within its lumen. There are diverticular changes of the  sigmoid colon without surrounding inflammation. Vascular/Lymphatic: Small lymph nodes within the porta hepatis and retroperitoneum are not pathologically enlarged. There is atherosclerosis of the aorta, its branches and the iliac arteries. Reproductive: Hysterectomy.  No evidence of adnexal mass. Other: No evidence of abdominal wall mass or hernia. Musculoskeletal: No acute or significant osseous findings. Asymmetric degenerative changes of the right hip. IMPRESSION: 1. No evidence of acute appendicitis or other definite acute findings in the abdomen or pelvis. 2. Possible wall thickening of the proximal to mid stomach. This could be secondary to incomplete distension and is suboptimally evaluated by this examination. Consider outpatient evaluation with endoscopy or upper GI series. 3. 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 4. Hepatic steatosis and atherosclerosis noted. Electronically Signed   By: Richardean Sale M.D.   On: 04/13/2015 13:04   Dg Chest 2 View  04/29/2015  CLINICAL DATA:  Chest pain and shortness of breath for 1 day, hypertension, coronary artery disease, smoker, type II diabetes mellitus, asthma EXAM: CHEST  2 VIEW COMPARISON:  02/12/2015 FINDINGS: Minimal enlargement of cardiac silhouette. Coronary arterial calcification versus stent. Mediastinal contours and pulmonary vascularity normal. Bronchitic changes and hyperinflation consistent with COPD. No acute infiltrate, pleural effusion or pneumothorax. Bones demineralized. IMPRESSION: COPD changes. Minimal enlargement of cardiac silhouette. No acute abnormalities. Electronically Signed   By: Lavonia Dana M.D.   On: 04/29/2015 19:08    Assessment/Plan Principal Problem:  Atrial fibrillation with RVR (HCC) Active Problems:  Diabetes (HCC)  HTN (hypertension)  Coronary artery disease involving native coronary artery of native heart without angina pectoris  1. Atrial fibrillation with rapid ventricular response -  On admission she was noted to be in A. fib with RVR at a rapid rate and had improvement in ventricular response with IV Cardizem. She recently was found to have obstructive coronary artery disease and had stents placed, therefore she is on aspirin and Plavix. This patients CHA2DS2-VASc Score and unadjusted Ischemic Stroke Rate (% per year) is equal to 4.8 % stroke rate/year from a score of 4 (Above score calculated as 1 point each if present [CHF, HTN, DM, Vascular=MI/PAD/Aortic Plaque, Age if 65-74, or Female] Above score calculated as 2 points each if present [Age > 75, or Stroke/TIA/TE]. . As her stents were placed in October, Aspirin was stopped and continued on Plavix and started on Eliquis 5 mg twice a day. She did mention that she's had some guaiac positive stools recently.  She converted to NSR  and has been maintaining that since. Recent 2D echo with normal LVF. Appreciate GI input.  They do not want to do EGD or colonoscopy until she can safely be off Plavix and Eliquis for several days.  Her PCI was 02/27/2015 with DES so need to hold off until at least February and preferably May (9 months out).  Will need to monitor hemoglobin closely.  Of note she is mildly hyperthyroid on exam with goiter.  Discussed with IM and since it is very mild they recommend following with repeat TSH/T4 in 3 months.  Will defer to her PCP and have her followup with PCP in the next 1-2 weeks.    2. Hypertension- Appears controlled. Continue her home Toprol XL and Cozaar.  3. Type 2 diabetes- continue home antidiabetic medications.  4. Suppressed TSH- TSH is 0.252. T4 elevated c/w hyperthyroidism. Discussed with hospitalist and this is very mild and recommend no treatment at this time.  Needs to followup outpt with PCP.   5. Dyslipidemia- well-controlled. Continue current dose Lipitor.   Sueanne Margarita, MD  05/01/2015  8:59 AM

## 2015-05-01 NOTE — Progress Notes (Signed)
Daily Rounding Note  05/01/2015, 8:42 AM  LOS: 2 days   SUBJECTIVE:       Feels well.  Really wants to go home.   OBJECTIVE:         Vital signs in last 24 hours:    Temp:  [98 F (36.7 C)-98.2 F (36.8 C)] 98.2 F (36.8 C) (12/22 0501) Pulse Rate:  [70-75] 75 (12/22 0501) Resp:  [18] 18 (12/22 0501) BP: (137-158)/(67-85) 158/85 mmHg (12/22 0501) SpO2:  [98 %-100 %] 98 % (12/22 0501) Last BM Date: 04/29/15 Filed Weights   04/29/15 1758 04/29/15 2254  Weight: 78.472 kg (173 lb) 78.2 kg (172 lb 6.4 oz)   General: pleasant,    Heart: RRR Chest: clear bil.  No dyspnea Abdomen: soft, NT, ND.  Active BS.    Extremities: no CCE Neuro/Psych:  Pleasant, cooperative.  Fully alert and oriented.   Intake/Output from previous day: 12/21 0701 - 12/22 0700 In: 960 [P.O.:960] Out: -   Intake/Output this shift:    Lab Results:  Recent Labs  04/29/15 1814 05/01/15 0259  WBC 9.2 9.3  HGB 11.0* 10.9*  HCT 32.5* 34.3*  PLT 322 365   BMET  Recent Labs  04/29/15 1814 04/30/15 0429 05/01/15 0259  NA 136 137 134*  K 4.1 3.8 4.6  CL 102 103 101  CO2 23 23 24   GLUCOSE 349* 215* 230*  BUN 11 11 15   CREATININE 0.71 0.59 0.69  CALCIUM 9.1 8.8* 9.0   LFT  Recent Labs  04/29/15 1814  PROT 6.1*  ALBUMIN 3.1*  AST 18  ALT 12*  ALKPHOS 111  BILITOT 0.2*   PT/INR  Recent Labs  04/29/15 1814  LABPROT 13.5  INR 1.01   Hepatitis Panel No results for input(s): HEPBSAG, HCVAB, HEPAIGM, HEPBIGM in the last 72 hours.  Studies/Results: Dg Chest 2 View  04/29/2015  CLINICAL DATA:  Chest pain and shortness of breath for 1 day, hypertension, coronary artery disease, smoker, type II diabetes mellitus, asthma EXAM: CHEST  2 VIEW COMPARISON:  02/12/2015 FINDINGS: Minimal enlargement of cardiac silhouette. Coronary arterial calcification versus stent. Mediastinal contours and pulmonary vascularity normal.  Bronchitic changes and hyperinflation consistent with COPD. No acute infiltrate, pleural effusion or pneumothorax. Bones demineralized. IMPRESSION: COPD changes. Minimal enlargement of cardiac silhouette. No acute abnormalities. Electronically Signed   By: Lavonia Dana M.D.   On: 04/29/2015 19:08    ASSESMENT:   * FOBT positive stool. No overt melena or bleeding per rectum.  * Previous history of colon polyps on the first 2 of 3 [place]. Diverticulosis evident by CT scan of 02/2015.  * GERD history and previous dilatation of esophageal strictures. Last dilation occurred in the early 1990s. GER symptoms well controlled with daily Protonix.  * New onset atrial fib with RVR, converted with Cardizem to sinus rhythm. Eliquis just initiated.  * 10/ 27/2016 placement of 2 drug-eluting stents. On Plavix since then.   *  Hyperthyroidism.  Low TSH, elevated free T4 and free T3.     PLAN   *  Obtain records of previous colonoscopies from [place].  EGD +/- colonoscopy as outpt, depending on the year of latest colonoscopy.  Dr Silverio Decamp will follow pt, so will cancel the 06/22/2014 appt with Dr Carlean Purl.  Warned pt to be on look out for melenic or bloody stools as well as increased DOE/fatigue/dizziness.       Judith [name]  Judith [name]  05/01/2015, 8:42 AM Pager: [phone]

## 2015-05-01 NOTE — Care Management Note (Signed)
Case Management Note  Patient Details  Name: Judith [name] MRN: [id number] Date of Birth: [birth year]  Subjective/Objective:        Pt admitted with a fib RVR            Action/Plan:  Pt is independent from home with sister.  Pt will discharge home on Eliquis, CM will assist with medication post discharge   Expected Discharge Date:                  Expected Discharge Plan:  Home/Self Care  In-House Referral:     Discharge planning Services  CM Consult, Medication Assistance  Post Acute Care Choice:    Choice offered to:     DME Arranged:    DME Agency:     HH Arranged:    HH Agency:     Status of Service:  Completed, signed off  Medicare Important Message Given:    Date Medicare IM Given:    Medicare IM give by:    Date Additional Medicare IM Given:    Additional Medicare Important Message give by:     If discussed at [place] of Stay Meetings, dates discussed:    Additional Comments: CM spoke with pt prior to discharge, Pt confirmed she has both free 30 day card and copay card for Eliquis.  CM discussed with pt the change of status from inpt to observation due to lack of intensity post review of chart.  CM assessed pt.  CM verified with pt that she doesn't have medicaid nor medicare.  CM provided both 30 day free Eliquis card and reduced copay card to pt.  Pt stated preferred pharmacy to be [facility] on [address], [place] contacted pharmacy and was informed that medication can be filled today.  No other CM needs determined prior to discharge. Maryclare Labrador, RN 05/01/2015, 10:23 AM

## 2015-05-02 SURGERY — ESOPHAGOGASTRODUODENOSCOPY (EGD) WITH PROPOFOL
Anesthesia: Moderate Sedation

## 2015-05-03 LAB — T4: T4 TOTAL: 7.8 ug/dL (ref 4.5–12.0)

## 2015-05-13 ENCOUNTER — Encounter: Payer: Self-pay | Admitting: Physician Assistant

## 2015-05-13 ENCOUNTER — Ambulatory Visit (INDEPENDENT_AMBULATORY_CARE_PROVIDER_SITE_OTHER): Payer: Commercial Managed Care - HMO | Admitting: Physician Assistant

## 2015-05-13 VITALS — BP 132/80 | HR 96 | Ht 64.0 in | Wt 164.6 lb

## 2015-05-13 DIAGNOSIS — Z7901 Long term (current) use of anticoagulants: Secondary | ICD-10-CM

## 2015-05-13 DIAGNOSIS — R195 Other fecal abnormalities: Secondary | ICD-10-CM | POA: Diagnosis not present

## 2015-05-13 DIAGNOSIS — I25708 Atherosclerosis of coronary artery bypass graft(s), unspecified, with other forms of angina pectoris: Secondary | ICD-10-CM

## 2015-05-13 NOTE — Progress Notes (Addendum)
Patient ID: Judith [name], female   DOB: [birth year], 65 y.o.   MRN: [id number]     History of Present Illness: Judith [name] is a 65 year old female with a history of type 2 diabetes, IDDM, CK D, GERD, and colon polyps. He was recently evaluated in the hospital on December 21 due to findings of heme-positive stools. She had a CT in October 2016 that showed a thick gastric wall versus under distention, prominent appendix, fatty liver, not enlarged small lymph nodes in porta hepatis, and colonic diverticulosis. She has a previous history of colon polyps. She has undergone 3 colonoscopies altogether, the latest was around 2011. On the first 2 colonoscopy she had polyps removed, there were no recurrent polyps at the third colonoscopy. Dating back 62 years or so she has had esophageal strictures dilated on 2 occasions but has not had an upper endoscopy since the early 1990s. She does have reflux but her symptoms are well controlled with a PPI. All of her previous colonoscopies and EGDs were performed at the [facility] in [name] by Dr. Sherron Ales.   Judith [name] is status post cardiac DES stents 2 in late October 2016. She was discharged on 81 mg of aspirin and Plavix. In November she submitted stool for FOBT testing and was found to have heme positive stools. She has had no bright red blood per rectum melena, or black stools. She denies a history of anemia or of requirements for iron, B-12, folate. She has never had a blood transfusion. GI family history notable for diverticulosis and iritis but no colon or intestinal or gastric cancers.  She was admitted December 20 with A. fib/RVR. Ellik class was initiated and patient converted with cardia 7 to sinus rhythm. Aspirin was discontinued. Baseline hemoglobin was 11.5-12.5 in July 2006 and in December was 11. Upon discharge from the hospital she was advised to follow-up with GI to schedule EGD and possible colonoscopy. She was seen by Dr. Radford Pax of cardiology on  December 22 while in the hospital and was told that since her PCI was 02/27/2015 with DES she should hold off until at least February but preferably may before she proceeds with EGD/colonoscopy. Patient has been feeling well and has no complaints.   Past Medical History  Diagnosis Date  . Hypertension   . Goiter   . Chronic kidney disease   . History of shingles 06/01/2013  . Abnormal EKG 07/31/2013  . GERD (gastroesophageal reflux disease)   . Carpal tunnel syndrome, bilateral   . Coronary artery disease   . Hyperlipidemia   . Anginal pain ([place])   . Asthma   . Pneumonia ~ 1976  . Type II diabetes mellitus (H[facility])     insulin dependent  . Arthritis     "hands" (03/06/2015)  . Diabetic peripheral neuropathy ([place]) "since 1996"  . Atrial fibrillation with RVR ([place]) 04/29/2015    Past Surgical History  Procedure Laterality Date  . Tonsillectomy  1976  . Cesarean section  1982; 1984  . Carpal tunnel release Right Nov 2015  . Knee arthroscopy Right ~ 2003    "meniscus repair"  . Shoulder open rotator cuff repair Right 1996; 1998    "w/fracture repair"  . Foot neuroma surgery Bilateral 2000  . Carpal tunnel release Right 1992; 05/2014    [name]; [place]  . Thyroid surgery  2000    "removed lots of nodules"  . Cardiac catheterization N/A 02/27/2015    Procedure: Left Heart Cath and Coronary Angiography;  Surgeon:  [name], MD; LAD 40%, mCFX 80%, OM 70%, RCA 100% calcified       . Cardiac catheterization N/A 03/06/2015    Procedure: Coronary Stent Intervention;  Surgeon: [name], MD;  Location: [facility];  Service: Cardiovascular;  Laterality: N/A;  Mid CX 3.50x12 promus DES w/ 0% resdual and Prox OM1 2.50x20 promus DES w/ 20% residual  . Abdominal hysterectomy  1988    age 40; CERVICAL DYSPLASIA; ovaries intact.    Family History  Problem Relation Age of Onset  . Cancer Mother 1    bronchial cancer  . Hypertension Father   . COPD Father   . Heart  disease Father 51    CAD with cardiac stenting  . Heart attack Father   . Allergies Sister   . Breast cancer Maternal Grandmother   . Emphysema Maternal Grandfather   . Leukemia Paternal Grandmother   . Emphysema Paternal Grandfather    Social History  Substance Use Topics  . Smoking status: Former Smoker -- 0.50 packs/day for 41 years    Types: Cigarettes  . Smokeless tobacco: Never Used     Comment: 04/29/2015 "quit smoking cigarettes 02/27/2015"  . Alcohol Use: No   Current Outpatient Prescriptions  Medication Sig Dispense Refill  . albuterol (PROVENTIL HFA) 108 (90 BASE) MCG/ACT inhaler Inhale into the lungs every 6 (six) hours as needed for wheezing or shortness of breath.    Marland Kitchen apixaban (ELIQUIS) 5 MG TABS tablet Take 1 tablet (5 mg total) by mouth 2 (two) times daily. 60 tablet 11  . atorvastatin (LIPITOR) 20 MG tablet Take 1 tablet (20 mg total) by mouth daily. 90 tablet 1  . buPROPion (WELLBUTRIN SR) 150 MG 12 hr tablet TAKE ONE TABLET BY MOUTH TWICE DAILY 60 tablet 5  . clopidogrel (PLAVIX) 75 MG tablet Take 1 tablet (75 mg total) by mouth daily. 30 tablet 11  . cyclobenzaprine (FLEXERIL) 10 MG tablet TAKE ONE TABLET BY MOUTH AT BEDTIME (Patient taking differently: TAKE ONE TABLET BY MOUTH AS NEEDED FOR MUSCLE SPASMS) 90 tablet 1  . diltiazem (CARDIZEM CD) 240 MG 24 hr capsule Take 1 capsule (240 mg total) by mouth daily. 30 capsule 11  . FLUoxetine (PROZAC) 20 MG capsule TAKE ONE CAPSULE BY MOUTH ONCE DAILY 30 capsule 5  . gabapentin (NEURONTIN) 300 MG capsule Three capsules at lunchtime; take three capsules at bedtime (Patient taking differently: Take 600-900 mg by mouth 2 (two) times daily. TAKES 2 CAPS IN AM AND 3 CAPS AT BEDTIME) 180 capsule 5  . glipiZIDE (GLUCOTROL XL) 5 MG 24 hr tablet Take 1 tablet (5 mg total) by mouth daily with lunch. 90 tablet 1  . Insulin Detemir (LEVEMIR) 100 UNIT/ML Pen Inject 20-25 Units into the skin daily at 10 pm. 15 mL 11  . insulin lispro  (HUMALOG) 100 UNIT/ML injection Inject 0.05-0.1 mLs (5-10 Units total) into the skin 3 (three) times daily with meals. (Patient taking differently: Inject 3-5 Units into the skin 3 (three) times daily with meals. ) 3 mL 0  . Insulin Pen Needle 32G X 6 MM MISC Use as directed 50 each 5  . ipratropium (ATROVENT) 0.03 % nasal spray Place 2 sprays into both nostrils 2 (two) times daily. 30 mL 0  . losartan (COZAAR) 50 MG tablet Take 1 tablet (50 mg total) by mouth daily. 30 tablet 11  . metFORMIN (GLUCOPHAGE) 1000 MG tablet TAKE ONE TABLET BY MOUTH TWICE DAILY WITH MEALS 60 tablet 5  .  metoprolol succinate (TOPROL-XL) 50 MG 24 hr tablet Take 1 tablet (50 mg total) by mouth daily. Take with or immediately following a meal. 30 tablet 11  . nitroGLYCERIN (NITROSTAT) 0.4 MG SL tablet Place 1 tablet (0.4 mg total) under the tongue every 5 (five) minutes as needed for chest pain. 25 tablet 3  . ondansetron (ZOFRAN) 4 MG tablet Take 1 tablet (4 mg total) by mouth every 8 (eight) hours as needed for nausea or vomiting. 20 tablet 0  . pantoprazole (PROTONIX) 40 MG tablet Take 1 tablet (40 mg total) by mouth daily. 30 tablet 11  . traMADol (ULTRAM) 50 MG tablet Take 1 tablet by mouth in the afternoon and 2 tablets at bedtime. (Patient taking differently: Take 100 mg by mouth at bedtime. ) 90 tablet 5  . triamcinolone cream (KENALOG) 0.1 % Apply 1 application topically 2 (two) times daily. May mix 1:1 with eurerin cream at home 454 g 0   No current facility-administered medications for this visit.   Allergies  Allergen Reactions  . Novolog [Insulin Aspart] Shortness Of Breath and Other (See Comments)    "breathing problems"  . Codeine Nausea And Vomiting and Other (See Comments)    HIGH DOSES-SEVERE VOMITING  . Iodine Other (See Comments)    MUST HAVE BENADRYL PRIOR TO PROCEDURE AND RIGHT BEFORE TREATMENT TO COUNTERACT REACTION-BLISTERING REACTION DERMATOLOGICAL  . Ace Inhibitors Cough  . Neosporin  [Neomycin-Bacitracin Zn-Polymyx] Itching and Rash    MAKES REACTIONS WORSE WHEN USING AS PROPHYLACTIC  . Penicillins Itching, Rash and Other (See Comments)    CHEST SIZED RASH AND ITCHING   . Tape Itching and Rash     Review of Systems: Gen: Denies any fever, chills, sweats, anorexia, fatigue, weakness, malaise, weight loss, and sleep disorder CV: Denies chest pain, angina, palpitations, syncope, orthopnea, PND, peripheral edema, and claudication. Resp: Denies dyspnea at rest, dyspnea with exercise, cough, sputum, wheezing, coughing up blood, and pleurisy. GI: Denies vomiting blood, jaundice, and fecal incontinence.   Denies dysphagia or odynophagia. GU : Denies urinary burning, blood in urine, urinary frequency, urinary hesitancy, nocturnal urination, and urinary incontinence. MS: Denies joint pain, limitation of movement, and swelling, stiffness, low back pain, extremity pain. Denies muscle weakness, cramps, atrophy.  Derm: Denies rash, itching, dry skin, hives, moles, warts, or unhealing ulcers.  Psych: Denies depression, anxiety, memory loss, suicidal ideation, hallucinations, paranoia, and confusion. Heme: Denies bruising, bleeding, and enlarged lymph nodes. Neuro:  Denies any headaches, dizziness, paresthesia Endo:  Denies any problems with DM, thyroid, adrenal  LAB RESULTS: CBC 05/01/2015 WBC 9.3, hemoglobin 10.9, hematocrit 34.3, platelets 365,000, MCV 87.7.  Studies:   Dg Chest 2 View  04/29/2015  CLINICAL DATA:  Chest pain and shortness of breath for 1 day, hypertension, coronary artery disease, smoker, type II diabetes mellitus, asthma EXAM: CHEST  2 VIEW COMPARISON:  02/12/2015 FINDINGS: Minimal enlargement of cardiac silhouette. Coronary arterial calcification versus stent. Mediastinal contours and pulmonary vascularity normal. Bronchitic changes and hyperinflation consistent with COPD. No acute infiltrate, pleural effusion or pneumothorax. Bones demineralized. IMPRESSION:  COPD changes. Minimal enlargement of cardiac silhouette. No acute abnormalities. Electronically Signed   By: Lavonia Dana M.D.   On: 04/29/2015 19:08     Physical Exam: BP 132/80 mmHg  Pulse 96  Ht 5\' 4"  (1.626 m)  Wt 164 lb 9.6 oz (74.662 kg)  BMI 28.24 kg/m2 General: Pleasant, well developed , female in no acute distress Head: Normocephalic and atraumatic Eyes:  sclerae anicteric, conjunctiva pink  Ears: Normal auditory acuity Lungs: Clear throughout to auscultation Heart: Regular rate and rhythm Abdomen: Soft, non distended, non-tender. No masses, no hepatomegaly. Normal bowel sounds Musculoskeletal: Symmetrical with no gross deformities  Extremities: No edema  Neurological: Alert oriented x 4, grossly nonfocal Psychological:  Alert and cooperative. Normal mood and affect  Assessment and Recommendations: 65 year old female status post cardiac DES stents 2 on 02/27/2015, status post recent admission for A. fib with RVR, currently on Plavix and requests, found to have FOBT positive stools, here for evaluation. Patient's hemoglobin has been stable and she has not had any melena or bright red blood per rectum. She has been advised to wait until May before proceeding with endoscopic evaluations for which she would need to come off her Elavil requests and Plavix. In the interim, she's been advised to monitor her stools for melena or bright red blood per rectum, and contact us if she experiences either of these. She will follow-up in April at which time she will review scheduling for EGD and possible colonoscopy in May. She has signed a medical release to get her endoscopy and colonoscopy reports from [name]. Patient would like to establish care with Dr. Silverio Decamp.        Marieli Rudy, Vita Barley PA-C 05/13/2015,  Addendum 05/14/2015. Received reports from [facility] endoscopy center. Head he had a colonoscopy on 12/23/2008 there was a small amount of feculent material in the proximal ascending  colon. There is moderate pandiverticulosis no endoscopic evidence of diverticulitis. In the mid sigmoid there were 2 small likely hyperplastic polyps in close proximity these were removed with cold biopsy forceps she was advised to have surveillance in 5 years. Patient had an EGD 11/27/2008 in [place] endoscopy center. She was noted to have short segment Barrett's esophagus and nodular fundal gastritis. Pathology showed squamous mucosa with mild reactive epithelial changes of the type that may be seen with gastroesophageal reflux. Mild active chronic inflammation of cardiac-type gastric mucosa. No intestinal metaplasia is seen. Negative for dysplasia. She was advised to have surveillance in 2 years.

## 2015-05-14 ENCOUNTER — Ambulatory Visit (INDEPENDENT_AMBULATORY_CARE_PROVIDER_SITE_OTHER): Payer: Commercial Managed Care - HMO | Admitting: Emergency Medicine

## 2015-05-14 ENCOUNTER — Telehealth: Payer: Self-pay

## 2015-05-14 VITALS — BP 132/72 | HR 98 | Temp 99.5°F | Ht 64.0 in | Wt 165.0 lb

## 2015-05-14 DIAGNOSIS — G5682 Other specified mononeuropathies of left upper limb: Secondary | ICD-10-CM | POA: Diagnosis not present

## 2015-05-14 MED ORDER — CYCLOBENZAPRINE HCL 5 MG PO TABS: 5.0000 mg | ORAL_TABLET | Freq: Three times a day (TID) | ORAL | Status: DC | PRN

## 2015-05-14 MED ORDER — NAPROXEN SODIUM 550 MG PO TABS
550.0000 mg | ORAL_TABLET | Freq: Two times a day (BID) | ORAL | Status: DC
Start: 2015-05-14 — End: 2015-05-30

## 2015-05-14 MED ORDER — HYDROCODONE-ACETAMINOPHEN 5-325 MG PO TABS: 1.0000 | ORAL_TABLET | ORAL | Status: DC | PRN

## 2015-05-14 NOTE — Patient Instructions (Signed)
Bursitis °Bursitis is inflammation and irritation of a bursa, which is one of the small, fluid-filled sacs that cushion and protect the moving parts of your body. These sacs are located between bones and muscles, muscle attachments, or skin areas next to bones. A bursa protects these structures from the wear and tear that results from frequent movement. °An inflamed bursa causes pain and swelling. Fluid may build up inside the sac. Bursitis is most common near joints, especially the knees, elbows, hips, and shoulders. °CAUSES °Bursitis can be caused by:  °· Injury from: °¨ A direct blow, like falling on your knee or elbow. °¨ Overuse of a joint (repetitive stress). °· Infection. This can happen if bacteria gets into a bursa through a cut or scrape near a joint. °· Diseases that cause joint inflammation, such as gout and rheumatoid arthritis. °RISK FACTORS °You may be at risk for bursitis if you:  °· Have a job or hobby that involves a lot of repetitive stress on your joints. °· Have a condition that weakens your body's defense system (immune system), such as diabetes, cancer, or HIV. °· Lift and reach overhead often. °· Kneel or lean on hard surfaces often. °· Run or walk often. °SIGNS AND SYMPTOMS °The most common signs and symptoms of bursitis are: °· Pain that gets worse when you move the affected body part or put weight on it. °· Inflammation. °· Stiffness. °Other signs and symptoms may include: °· Redness. °· Tenderness. °· Warmth. °· Pain that continues after rest. °· Fever and chills. This may occur in bursitis caused by infection. °DIAGNOSIS °Bursitis may be diagnosed by:  °· Medical history and physical exam. °· MRI. °· A procedure to drain fluid from the bursa with a needle (aspiration). The fluid may be checked for signs of infection or gout. °· Blood tests to rule out other causes of inflammation. °TREATMENT  °Bursitis can usually be treated at home with rest, ice, compression, and elevation (RICE). For  mild bursitis, RICE treatment may be all you need. Other treatments may include: °· Nonsteroidal anti-inflammatory drugs (NSAIDs) to treat pain and inflammation. °· Corticosteroids to fight inflammation. You may have these drugs injected into and around the area of bursitis. °· Aspiration of bursitis fluid to relieve pain and improve movement. °· Antibiotic medicine to treat an infected bursa. °· A splint, brace, or walking aid. °· Physical therapy if you continue to have pain or limited movement. °· Surgery to remove a damaged or infected bursa. This may be needed if you have a very bad case of bursitis or if other treatments have not worked. °HOME CARE INSTRUCTIONS  °· Take medicines only as directed by your health care provider. °· If you were prescribed an antibiotic medicine, finish it all even if you start to feel better. °· Rest the affected area as directed by your health care provider. °¨ Keep the area elevated. °¨ Avoid activities that make pain worse. °· Apply ice to the injured area: °¨ Place ice in a plastic bag. °¨ Place a towel between your skin and the bag. °¨ Leave the ice on for 20 minutes, 2-3 times a day. °· Use splints, braces, pads, or walking aids as directed by your health care provider. °· Keep all follow-up visits as directed by your health care provider. This is important. °PREVENTION  °· Wear knee pads if you kneel often. °· Wear sturdy running or walking shoes that fit you well. °· Take regular breaks from repetitive activity. °· Warm   up by stretching before doing any strenuous activity. °· Maintain a healthy weight or lose weight as recommended by your health care provider. Ask your health care provider if you need help. °· Exercise regularly. Start any new physical activity gradually. °SEEK MEDICAL CARE IF:  °· Your bursitis is not responding to treatment or home care. °· You have a fever. °· You have chills. °  °This information is not intended to replace advice given to you by your  health care provider. Make sure you discuss any questions you have with your health care provider. °  °Document Released: 04/23/2000 Document Revised: 01/15/2015 Document Reviewed: 07/16/2013 °Elsevier Interactive Patient Education ©2016 [employer] ° °

## 2015-05-14 NOTE — Telephone Encounter (Signed)
This message is for Dr. Tamala Julian. She wants to know if she needs to make a follow up hospital appointment with her while she is in the care of [facility]?  Please advise  [phone]

## 2015-05-14 NOTE — Progress Notes (Signed)
Subjective:  Patient ID: Judith [name], female    DOB: 09-14-1950  Age: 65 y.o. MRN: [id number]  CC: Neck Pain; Shoulder Pain; Hypertension; and Medication Refill   HPI Judith [name] presents  patient has several day history of pain in difficulty with movement of her left shoulder. She says the pain travels up into her neck and prevents her from rotating her neck or flexor extending it. And also is down in the posterior shoulder. She has no history of injury or overuse. 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   She has past surgical history that includes Tonsillectomy (1976); Cesarean section [id number]; 1984); Carpal tunnel release (Right, Nov 2015); Knee arthroscopy (Right, ~ 2003); Shoulder open rotator cuff repair (Right, 1996; 1998); Foot neuroma surgery (Bilateral, 2000); Carpal tunnel release (Right, 1992; 05/2014); Thyroid surgery (2000); Cardiac catheterization (N/A, 02/27/2015); Cardiac catheterization (N/A, 03/06/2015); and Abdominal hysterectomy (1988).   Her  family history includes Allergies in her sister; Breast cancer in her maternal grandmother; COPD in her father; Cancer (age of onset: 22) in her mother; Emphysema in her maternal grandfather and paternal grandfather; Heart attack in her father; Heart disease (age of onset: 57) in her father; Hypertension in her father; Leukemia in her paternal grandmother.  She   reports that she has quit smoking. Her smoking use included Cigarettes.  She has a 20.5 pack-year smoking history. She has never used smokeless tobacco. She reports that she does not drink alcohol or use illicit drugs.  Outpatient Prescriptions Prior to Visit  Medication Sig Dispense Refill  . albuterol (PROVENTIL HFA) 108 (90 BASE) MCG/ACT inhaler Inhale into the lungs every 6 (six) hours as needed for wheezing or shortness of breath.    Marland Kitchen apixaban (ELIQUIS) 5 MG TABS tablet Take 1 tablet (5 mg total) by mouth 2 (two) times daily. 60 tablet 11  . atorvastatin (LIPITOR) 20 MG tablet Take 1 tablet (20 mg total) by mouth daily. 90 tablet 1  . buPROPion (WELLBUTRIN SR) 150 MG 12 hr tablet TAKE ONE TABLET BY MOUTH TWICE DAILY 60 tablet 5  . clopidogrel (PLAVIX) 75 MG tablet Take 1 tablet (75 mg total) by mouth daily. 30 tablet 11  . cyclobenzaprine (FLEXERIL) 10 MG tablet TAKE ONE TABLET BY MOUTH AT BEDTIME (Patient taking differently: TAKE ONE TABLET BY MOUTH AS NEEDED FOR MUSCLE SPASMS) 90 tablet 1  . diltiazem (CARDIZEM CD) 240 MG 24 hr capsule Take 1 capsule (240 mg total) by mouth daily. 30 capsule 11  . FLUoxetine (PROZAC) 20 MG capsule TAKE ONE CAPSULE BY MOUTH ONCE DAILY 30 capsule 5  . gabapentin (NEURONTIN) 300 MG capsule Three capsules at lunchtime; take three capsules at bedtime (Patient taking differently: Take 600-900 mg by mouth 2 (two) times daily. TAKES 2 CAPS IN AM AND 3 CAPS AT BEDTIME) 180 capsule 5  . Insulin Detemir (LEVEMIR) 100 UNIT/ML Pen Inject 20-25 Units into the skin daily at 10 pm. 15 mL 11  . insulin lispro (HUMALOG) 100 UNIT/ML injection Inject 0.05-0.1 mLs (5-10 Units total) into the skin 3 (three) times daily with meals. (Patient taking differently: Inject 3-5  Units into the skin 3 (three) times daily with meals. ) 3 mL 0  . Insulin Pen Needle 32G X 6 MM MISC Use as directed 50 each 5  . ipratropium (ATROVENT) 0.03 % nasal spray Place 2 sprays into both nostrils 2 (two) times daily. 30 mL 0  . losartan (COZAAR) 50 MG tablet Take 1 tablet (50  mg total) by mouth daily. 30 tablet 11  . metFORMIN (GLUCOPHAGE) 1000 MG tablet TAKE ONE TABLET BY MOUTH TWICE DAILY WITH MEALS 60 tablet 5  . metoprolol succinate (TOPROL-XL) 50 MG 24 hr tablet Take 1 tablet (50 mg total) by mouth daily. Take with or immediately following a meal. 30 tablet 11  . nitroGLYCERIN (NITROSTAT) 0.4 MG SL tablet Place 1 tablet (0.4 mg total) under the tongue every 5 (five) minutes as needed for chest pain. 25 tablet 3  . ondansetron (ZOFRAN) 4 MG tablet Take 1 tablet (4 mg total) by mouth every 8 (eight) hours as needed for nausea or vomiting. 20 tablet 0  . pantoprazole (PROTONIX) 40 MG tablet Take 1 tablet (40 mg total) by mouth daily. 30 tablet 11  . traMADol (ULTRAM) 50 MG tablet Take 1 tablet by mouth in the afternoon and 2 tablets at bedtime. (Patient taking differently: Take 100 mg by mouth at bedtime. ) 90 tablet 5  . triamcinolone cream (KENALOG) 0.1 % Apply 1 application topically 2 (two) times daily. May mix 1:1 with eurerin cream at home 454 g 0  . glipiZIDE (GLUCOTROL XL) 5 MG 24 hr tablet Take 1 tablet (5 mg total) by mouth daily with lunch. (Patient not taking: Reported on 05/14/2015) 90 tablet 1   No facility-administered medications prior to visit.    Social History   Social History  . Marital Status: Divorced    Spouse Name: N/A  . Number of Children: N/A  . Years of Education: N/A   Occupational History  . Food Therapist, nutritional    Social History Main Topics  . Smoking status: Former Smoker -- 0.50 packs/day for 41 years    Types: Cigarettes  . Smokeless tobacco: Never Used     Comment: 04/29/2015 "quit smoking cigarettes 02/27/2015"  . Alcohol Use: No  . Drug Use: No  . Sexual Activity: Not Currently    Birth Control/ Protection: Post-menopausal, Surgical   Other Topics Concern  . 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      Lives with sister in [place].      Tobacco: 1 ppd x 41 years      Alcohol: none      Drugs: none      Exercise:  Walking in neighborhood; physical job.        Review of Systems  Constitutional: Negative for fever, chills and appetite change.  HENT: Negative for congestion, ear pain, postnasal drip, sinus pressure and sore throat.   Eyes: Negative for pain and redness.  Respiratory: Negative for cough, shortness of breath and wheezing.   Cardiovascular: Negative for leg swelling.  Gastrointestinal: Negative for nausea, vomiting, abdominal pain, diarrhea, constipation and blood in stool.  Endocrine: Negative for polyuria.  Genitourinary: Negative for dysuria, urgency, frequency and flank pain.  Musculoskeletal: Positive for neck pain. Negative for gait problem.  Skin: Negative for rash.  Neurological: Negative for weakness and headaches.  Psychiatric/Behavioral: Negative for confusion and decreased concentration. The patient is not nervous/anxious.     Objective:  BP 132/72 mmHg  Pulse 98  Temp(Src) 99.5 F (37.5 C) (Oral)  Ht 5\' 4"  (1.626 m)  Wt 165 lb (74.844 kg)  BMI 28.31 kg/m2  SpO2 98%  Physical Exam  Constitutional: She is oriented to person, place, and time. She appears well-developed and well-nourished.  HENT:  Head: Normocephalic and atraumatic.  Eyes: Conjunctivae are normal. Pupils are equal, round, and reactive to light.  Pulmonary/Chest: Effort normal.  Musculoskeletal: She exhibits no edema.       Left shoulder: She exhibits decreased range of motion, tenderness and spasm.  She has marked tenderness posterior left shoulder in the region of the angle of scapula medial.  Neurological: She is alert and oriented to person, place, and time.  Skin: Skin is dry.  Psychiatric: She has a normal mood and affect. Her behavior is normal. Thought  content normal.      Assessment & Plan:   Judith [name] was seen today for neck pain, shoulder pain, hypertension and medication refill.  Diagnoses and all orders for this visit:  Scapulocostal syndrome, left  Other orders -     naproxen sodium (ANAPROX DS) 550 MG tablet; Take 1 tablet (550 mg total) by mouth 2 (two) times daily with a meal. -     cyclobenzaprine (FLEXERIL) 5 MG tablet; Take 1 tablet (5 mg total) by mouth 3 (three) times daily as needed for muscle spasms. -     HYDROcodone-acetaminophen (NORCO) 5-325 MG tablet; Take 1-2 tablets by mouth every 4 (four) hours as needed.  I am having Ms. Koplin start on naproxen sodium, cyclobenzaprine, and HYDROcodone-acetaminophen. I am also having her maintain her albuterol, triamcinolone cream, buPROPion, FLUoxetine, metFORMIN, Insulin Detemir, Insulin Pen Needle, gabapentin, traMADol, ipratropium, nitroGLYCERIN, clopidogrel, insulin lispro, pantoprazole, losartan, metoprolol succinate, glipiZIDE, cyclobenzaprine, ondansetron, atorvastatin, apixaban, and diltiazem.  Meds ordered this encounter  Medications  . naproxen sodium (ANAPROX DS) 550 MG tablet    Sig: Take 1 tablet (550 mg total) by mouth 2 (two) times daily with a meal.    Dispense:  40 tablet    Refill:  0  . cyclobenzaprine (FLEXERIL) 5 MG tablet    Sig: Take 1 tablet (5 mg total) by mouth 3 (three) times daily as needed for muscle spasms.    Dispense:  30 tablet    Refill:  0  . HYDROcodone-acetaminophen (NORCO) 5-325 MG tablet    Sig: Take 1-2 tablets by mouth every 4 (four) hours as needed.    Dispense:  30 tablet    Refill:  0    Appropriate red flag conditions were discussed with the patient as well as actions that should be taken.  Patient expressed his understanding.  Follow-up: Return if symptoms worsen or fail to improve.  Roselee Culver, MD

## 2015-05-14 NOTE — Telephone Encounter (Signed)
Left message if Dr. Tamala Julian is her PCP then yes she will have to follow up with her as well.

## 2015-05-19 ENCOUNTER — Inpatient Hospital Stay: Payer: Commercial Managed Care - HMO | Admitting: Family Medicine

## 2015-05-19 NOTE — Progress Notes (Signed)
Reviewed and agree with documentation and assessment and plan. Patient is past due for surveillance EGD and colonoscopy was inadequate prep in 2010. Will plan for EGD and colonoscopy once its safe for patient to hold anticoagulation for few days, need to discuss with cardiology K. Denzil Magnuson , MD

## 2015-05-23 ENCOUNTER — Encounter (HOSPITAL_COMMUNITY): Payer: Self-pay

## 2015-05-23 ENCOUNTER — Emergency Department (HOSPITAL_COMMUNITY)
Admission: EM | Admit: 2015-05-23 | Discharge: 2015-05-23 | Disposition: A | Discharge status: Home / Self Care | Payer: Worker's Compensation | Attending: Emergency Medicine | Admitting: Emergency Medicine

## 2015-05-23 ENCOUNTER — Other Ambulatory Visit: Payer: Self-pay

## 2015-05-23 ENCOUNTER — Emergency Department (HOSPITAL_COMMUNITY): Payer: Worker's Compensation

## 2015-05-23 ENCOUNTER — Telehealth: Payer: Self-pay

## 2015-05-23 DIAGNOSIS — Y9289 Other specified places as the place of occurrence of the external cause: Secondary | ICD-10-CM | POA: Diagnosis not present

## 2015-05-23 DIAGNOSIS — Z87891 Personal history of nicotine dependence: Secondary | ICD-10-CM | POA: Diagnosis not present

## 2015-05-23 DIAGNOSIS — J45909 Unspecified asthma, uncomplicated: Secondary | ICD-10-CM | POA: Insufficient documentation

## 2015-05-23 DIAGNOSIS — S0003XA Contusion of scalp, initial encounter: Secondary | ICD-10-CM | POA: Insufficient documentation

## 2015-05-23 DIAGNOSIS — Z794 Long term (current) use of insulin: Secondary | ICD-10-CM | POA: Insufficient documentation

## 2015-05-23 DIAGNOSIS — Y998 Other external cause status: Secondary | ICD-10-CM | POA: Diagnosis not present

## 2015-05-23 DIAGNOSIS — E1140 Type 2 diabetes mellitus with diabetic neuropathy, unspecified: Secondary | ICD-10-CM | POA: Diagnosis not present

## 2015-05-23 DIAGNOSIS — Z79899 Other long term (current) drug therapy: Secondary | ICD-10-CM | POA: Diagnosis not present

## 2015-05-23 DIAGNOSIS — Y9389 Activity, other specified: Secondary | ICD-10-CM | POA: Insufficient documentation

## 2015-05-23 DIAGNOSIS — K219 Gastro-esophageal reflux disease without esophagitis: Secondary | ICD-10-CM | POA: Insufficient documentation

## 2015-05-23 DIAGNOSIS — W1839XA Other fall on same level, initial encounter: Secondary | ICD-10-CM | POA: Insufficient documentation

## 2015-05-23 DIAGNOSIS — I129 Hypertensive chronic kidney disease with stage 1 through stage 4 chronic kidney disease, or unspecified chronic kidney disease: Secondary | ICD-10-CM | POA: Diagnosis not present

## 2015-05-23 DIAGNOSIS — Z8701 Personal history of pneumonia (recurrent): Secondary | ICD-10-CM | POA: Diagnosis not present

## 2015-05-23 DIAGNOSIS — I25119 Atherosclerotic heart disease of native coronary artery with unspecified angina pectoris: Secondary | ICD-10-CM | POA: Diagnosis not present

## 2015-05-23 DIAGNOSIS — Z7902 Long term (current) use of antithrombotics/antiplatelets: Secondary | ICD-10-CM | POA: Diagnosis not present

## 2015-05-23 DIAGNOSIS — S0990XA Unspecified injury of head, initial encounter: Secondary | ICD-10-CM | POA: Diagnosis present

## 2015-05-23 DIAGNOSIS — W19XXXA Unspecified fall, initial encounter: Secondary | ICD-10-CM

## 2015-05-23 DIAGNOSIS — N189 Chronic kidney disease, unspecified: Secondary | ICD-10-CM | POA: Diagnosis not present

## 2015-05-23 LAB — PROTIME-INR
INR: 1.03 (ref 0.00–1.49)
PROTHROMBIN TIME: 13.7 s (ref 11.6–15.2)

## 2015-05-23 NOTE — Discharge Instructions (Signed)
Your CT scans does not show serious injury today. Return without fail for worsening symptoms, including confusion, worsening pain, vomiting and unable to keep down food/fluids, numbness or weakness of face/arms/legs, or any other symptoms concerning to you.   Rarely, people who are on blood thinners can have delayed bleeding after injuring their head. So please return for re-evaluation if you have any of the above symptoms.   Facial or Scalp Contusion A facial or scalp contusion is a deep bruise on the face or head. Injuries to the face and head generally cause a lot of swelling, especially around the eyes. Contusions are the result of an injury that caused bleeding under the skin. The contusion may turn blue, purple, or yellow. Minor injuries will give you a painless contusion, but more severe contusions may stay painful and swollen for a few weeks.  CAUSES  A facial or scalp contusion is caused by a blunt injury or trauma to the face or head area.  SIGNS AND SYMPTOMS   Swelling of the injured area.   Discoloration of the injured area.   Tenderness, soreness, or pain in the injured area.  DIAGNOSIS  The diagnosis can be made by taking a medical history and doing a physical exam. An X-ray exam, CT scan, or MRI may be needed to determine if there are any associated injuries, such as broken bones (fractures). TREATMENT  Often, the best treatment for a facial or scalp contusion is applying cold compresses to the injured area. Over-the-counter medicines may also be recommended for pain control.  HOME CARE INSTRUCTIONS   Only take over-the-counter or prescription medicines as directed by your health care provider.   Apply ice to the injured area.   Put ice in a plastic bag.   Place a towel between your skin and the bag.   Leave the ice on for 20 minutes, 2-3 times a day.  SEEK MEDICAL CARE IF:  You have bite problems.   You have pain with chewing.   You are concerned about  facial defects. SEEK IMMEDIATE MEDICAL CARE IF:  You have severe pain or a headache that is not relieved by medicine.   You have unusual sleepiness, confusion, or personality changes.   You throw up (vomit).   You have a persistent nosebleed.   You have double vision or blurred vision.   You have fluid drainage from your nose or ear.   You have difficulty walking or using your arms or legs.  MAKE SURE YOU:   Understand these instructions.  Will watch your condition.  Will get help right away if you are not doing well or get worse.   This information is not intended to replace advice given to you by your health care provider. Make sure you discuss any questions you have with your health care provider.   Document Released: 06/03/2004 Document Revised: 05/17/2014 Document Reviewed: 12/07/2012 Elsevier Interactive Patient Education [employer].

## 2015-05-23 NOTE — ED Notes (Signed)
Patient transported to CT 

## 2015-05-23 NOTE — Telephone Encounter (Signed)
  05/23/2015   RE: Judith [name] DOB: 05/12/1950 MRN: [id number]   Dear Dr Radford Pax,    Dr Silverio Decamp recommends the above patient have endoscopic procedures. Our records show that she is on anticoagulation therapy.   Please advise as to how long the patient may come off her therapy of Plavix prior to the procedure?  Please fax back/ or route the completed form to [facility] at [phone].   Sincerely,    Virgina Evener, LPN

## 2015-05-23 NOTE — ED Provider Notes (Signed)
CSN: [id number]     Arrival date & time 05/23/15  [id number] History   First MD Initiated Contact with Patient 05/23/15 0900     Chief Complaint  Patient presents with  . Fall/Workers comp      (Consider location/radiation/quality/duration/timing/severity/associated sxs/prior Treatment) HPI 65 year old female who presents after fall. History of HTN, CKD, atrial fibrillation and CAD. Takes plavix and apixaban. In usual state of health. At work [employer], when a component jumped out at her. She was thrown backward and hit her head on a metal cart. No LOC or HA. No N/V, confusion, vision or speech changes, numbness of weakness. No other injuries.   Past Medical History  Diagnosis Date  . Hypertension   . Goiter   . Chronic kidney disease   . History of shingles 06/01/2013  . Abnormal EKG 07/31/2013  . GERD (gastroesophageal reflux disease)   . Carpal tunnel syndrome, bilateral   . Coronary artery disease   . Hyperlipidemia   . Anginal pain ([place])   . Asthma   . Pneumonia ~ 1976  . Type II diabetes mellitus (H[facility])     insulin dependent  . Arthritis     "hands" (03/06/2015)  . Diabetic peripheral neuropathy ([place]) "since 1996"  . Atrial fibrillation with RVR ([place]) 04/29/2015   Past Surgical History  Procedure Laterality Date  . Tonsillectomy  1976  . Cesarean section  1982; 1984  . Carpal tunnel release Right Nov 2015  . Knee arthroscopy Right ~ 2003    "meniscus repair"  . Shoulder open rotator cuff repair Right 1996; 1998    "w/fracture repair"  . Foot neuroma surgery Bilateral 2000  . Carpal tunnel release Right 1992; 05/2014    [name]; [place]  . Thyroid surgery  2000    "removed lots of nodules"  . Cardiac catheterization N/A 02/27/2015    Procedure: Left Heart Cath and Coronary Angiography;  Surgeon: [name], MD; LAD 40%, mCFX 80%, OM 70%, RCA 100% calcified       . Cardiac catheterization N/A 03/06/2015    Procedure: Coronary Stent Intervention;   Surgeon: [name], MD;  Location: [facility];  Service: Cardiovascular;  Laterality: N/A;  Mid CX 3.50x12 promus DES w/ 0% resdual and Prox OM1 2.50x20 promus DES w/ 20% residual  . Abdominal hysterectomy  1988    age 89; CERVICAL DYSPLASIA; ovaries intact.    Family History  Problem Relation Age of Onset  . Cancer Mother 25    bronchial cancer  . Hypertension Father   . COPD Father   . Heart disease Father 5    CAD with cardiac stenting  . Heart attack Father   . Allergies Sister   . Breast cancer Maternal Grandmother   . Emphysema Maternal Grandfather   . Leukemia Paternal Grandmother   . Emphysema Paternal Grandfather    Social History  Substance Use Topics  . Smoking status: Former Smoker -- 0.50 packs/day for 41 years    Types: Cigarettes  . Smokeless tobacco: Never Used     Comment: 04/29/2015 "quit smoking cigarettes 02/27/2015"  . Alcohol Use: No   OB History    No data available     Review of Systems 10/14 systems reviewed and are negative other than those stated in the HPI    Allergies  Novolog; Codeine; Iodine; Ace inhibitors; Neosporin; Penicillins; and Tape  Home Medications   Prior to Admission medications   Medication Sig Start Date End Date  Taking? Authorizing Provider  albuterol (PROVENTIL HFA) 108 (90 BASE) MCG/ACT inhaler Inhale into the lungs every 6 (six) hours as needed for wheezing or shortness of breath.    Historical Provider, MD  apixaban (ELIQUIS) 5 MG TABS tablet Take 1 tablet (5 mg total) by mouth 2 (two) times daily. 05/01/15   Bhavinkumar Bhagat, PA  atorvastatin (LIPITOR) 20 MG tablet Take 1 tablet (20 mg total) by mouth daily. 04/22/15   Liliane Shi, PA-C  buPROPion (WELLBUTRIN SR) 150 MG 12 hr tablet TAKE ONE TABLET BY MOUTH TWICE DAILY 09/28/14   Barton Fanny, MD  clopidogrel (PLAVIX) 75 MG tablet Take 1 tablet (75 mg total) by mouth daily. 02/27/15   [name], MD  cyclobenzaprine (FLEXERIL) 10 MG tablet  TAKE ONE TABLET BY MOUTH AT BEDTIME Patient taking differently: TAKE ONE TABLET BY MOUTH AS NEEDED FOR MUSCLE SPASMS 04/15/15   [name], MD  cyclobenzaprine (FLEXERIL) 5 MG tablet Take 1 tablet (5 mg total) by mouth 3 (three) times daily as needed for muscle spasms. 05/14/15   Roselee Culver, MD  diltiazem (CARDIZEM CD) 240 MG 24 hr capsule Take 1 capsule (240 mg total) by mouth daily. 05/01/15   Bhavinkumar Bhagat, PA  FLUoxetine (PROZAC) 20 MG capsule TAKE ONE CAPSULE BY MOUTH ONCE DAILY 09/28/14   Barton Fanny, MD  gabapentin (NEURONTIN) 300 MG capsule Three capsules at lunchtime; take three capsules at bedtime Patient taking differently: Take 600-900 mg by mouth 2 (two) times daily. TAKES 2 CAPS IN AM AND 3 CAPS AT BEDTIME 12/06/14   [name], MD  glipiZIDE (GLUCOTROL XL) 5 MG 24 hr tablet Take 1 tablet (5 mg total) by mouth daily with lunch. Patient not taking: Reported on 05/14/2015 03/28/15   [name], MD  HYDROcodone-acetaminophen (NORCO) 5-325 MG tablet Take 1-2 tablets by mouth every 4 (four) hours as needed. 05/14/15   Roselee Culver, MD  Insulin Detemir (LEVEMIR) 100 UNIT/ML Pen Inject 20-25 Units into the skin daily at 10 pm. 09/28/14   Barton Fanny, MD  insulin lispro (HUMALOG) 100 UNIT/ML injection Inject 0.05-0.1 mLs (5-10 Units total) into the skin 3 (three) times daily with meals. Patient taking differently: Inject 3-5 Units into the skin 3 (three) times daily with meals.  02/27/15   Wendie Agreste, MD  Insulin Pen Needle 32G X 6 MM MISC Use as directed 09/28/14   Barton Fanny, MD  ipratropium (ATROVENT) 0.03 % nasal spray Place 2 sprays into both nostrils 2 (two) times daily. 02/20/15   Tishira R Brewington, PA-C  losartan (COZAAR) 50 MG tablet Take 1 tablet (50 mg total) by mouth daily. 03/13/15   Liliane Shi, PA-C  metFORMIN (GLUCOPHAGE) 1000 MG tablet TAKE ONE TABLET BY MOUTH TWICE DAILY WITH MEALS 09/28/14   Barton Fanny, MD   metoprolol succinate (TOPROL-XL) 50 MG 24 hr tablet Take 1 tablet (50 mg total) by mouth daily. Take with or immediately following a meal. 03/13/15   Liliane Shi, PA-C  naproxen sodium (ANAPROX DS) 550 MG tablet Take 1 tablet (550 mg total) by mouth 2 (two) times daily with a meal. 05/14/15 05/13/16  Roselee Culver, MD  nitroGLYCERIN (NITROSTAT) 0.4 MG SL tablet Place 1 tablet (0.4 mg total) under the tongue every 5 (five) minutes as needed for chest pain. 02/25/15   Liliane Shi, PA-C  ondansetron (ZOFRAN) 4 MG tablet Take 1 tablet (4 mg total) by mouth every  8 (eight) hours as needed for nausea or vomiting. 04/13/15   [name], PA-C  pantoprazole (PROTONIX) 40 MG tablet Take 1 tablet (40 mg total) by mouth daily. 03/07/15   Rhonda G Barrett, PA-C  traMADol (ULTRAM) 50 MG tablet Take 1 tablet by mouth in the afternoon and 2 tablets at bedtime. Patient taking differently: Take 100 mg by mouth at bedtime.  12/30/14   [name], MD  triamcinolone cream (KENALOG) 0.1 % Apply 1 application topically 2 (two) times daily. May mix 1:1 with eurerin cream at home 04/02/14   [name], PA-C   BP 138/88 mmHg  Pulse 88  Temp(Src) 98.6 F (37 C) (Oral)  Resp 16  Ht 5\' 4"  (1.626 m)  Wt 163 lb (73.936 kg)  BMI 27.97 kg/m2  SpO2 99% Physical Exam Physical Exam  Nursing note and vitals reviewed. Constitutional: Well developed, well nourished, non-toxic, and in no acute distress Head: Normocephalic. Posterior scalp hematoma. .  Mouth/Throat: Oropharynx is clear and moist.  Neck: Normal range of motion. Neck supple. No cervical spine tenderness,. Cardiovascular: Normal rate and regular rhythm.   Pulmonary/Chest: Effort normal and breath sounds normal. No chest wall tenderness Abdominal: Soft. There is no tenderness. There is no rebound and no guarding.  Musculoskeletal: Normal range of motion.  Neurological: Alert, no facial droop, fluent speech, moves all extremities symmetrically,  PERRL, EOMI, sensation in tact in all extremities. Skin: Skin is warm and dry.  Psychiatric: Cooperative  ED Course  Procedures (including critical care time) Labs Review Labs Reviewed  PROTIME-INR    Imaging Review Ct Head Wo Contrast  05/23/2015  CLINICAL DATA:  65 year old female who fell backwards while carrying a heavy load in her hands, struck occipital region and posterior neck on tile floor. Occipital swelling and posterior cervical neck pain. Initial encounter. EXAM: CT HEAD WITHOUT CONTRAST CT CERVICAL SPINE WITHOUT CONTRAST TECHNIQUE: Multidetector CT imaging of the head and cervical spine was performed following the standard protocol without intravenous contrast. Multiplanar CT image reconstructions of the cervical spine were also generated. COMPARISON:  None. FINDINGS: CT HEAD FINDINGS Visualized paranasal sinuses and mastoids are clear. Visualized orbit soft tissues are within normal limits. Soft tissue hematoma at the left vertex measuring 8 mm in thickness. Underlying calvarium intact. No other scalp hematoma identified. No skull fracture identified. Dystrophic mineralization in the dorsal left thalamus (series 3, image 13). This might be related to chronic small vessel disease or previous hypertensive hemorrhage. Partially empty sella. No intracranial mass effect. No acute intracranial hemorrhage identified. No ventriculomegaly. Outside of the left thalamus normal gray-white matter differentiation throughout the brain. Calcified atherosclerosis at the skull base. Dominant appearing distal left vertebral artery. CT CERVICAL SPINE FINDINGS Straightening of cervical lordosis. Visualized skull base is intact. No atlanto-occipital dissociation. Cervicothoracic junction alignment is within normal limits. Bilateral posterior element alignment is within normal limits. Moderate facet degeneration on the left at C5-C6 and C7-T1, including gas and fluid containing subchondral cysts. No cervical  spine fracture identified. Grossly intact visualized upper thoracic levels. Thyromegaly with some loss of soft tissue planes about the thyroid at the thoracic inlet (series 6, image 69). Mild narrowing of the trachea at the thoracic inlet. No definite surrounding lymphadenopathy. Calcified carotid bifurcation atherosclerosis. Otherwise negative noncontrast paraspinal soft tissues. IMPRESSION: 1. Left superior scalp hematoma without underlying fracture. 2. No acute intracranial abnormality. Dystrophic calcification dorsal left thalamus, favor related to chronic small vessel disease. 3. No acute fracture or listhesis identified in  the cervical spine. Ligamentous injury is not excluded. 4. Thyromegaly with suggestion of some loss of surrounding fat planes. Recommend nonemergent Thyroid Ultrasound evaluation. Electronically Signed   By: Genevie Ann M.D.   On: 05/23/2015 10:34   Ct Cervical Spine Wo Contrast  05/23/2015  CLINICAL DATA:  65 year old female who fell backwards while carrying a heavy load in her hands, struck occipital region and posterior neck on tile floor. Occipital swelling and posterior cervical neck pain. Initial encounter. EXAM: CT HEAD WITHOUT CONTRAST CT CERVICAL SPINE WITHOUT CONTRAST TECHNIQUE: Multidetector CT imaging of the head and cervical spine was performed following the standard protocol without intravenous contrast. Multiplanar CT image reconstructions of the cervical spine were also generated. COMPARISON:  None. FINDINGS: CT HEAD FINDINGS Visualized paranasal sinuses and mastoids are clear. Visualized orbit soft tissues are within normal limits. Soft tissue hematoma at the left vertex measuring 8 mm in thickness. Underlying calvarium intact. No other scalp hematoma identified. No skull fracture identified. Dystrophic mineralization in the dorsal left thalamus (series 3, image 13). This might be related to chronic small vessel disease or previous hypertensive hemorrhage. Partially empty  sella. No intracranial mass effect. No acute intracranial hemorrhage identified. No ventriculomegaly. Outside of the left thalamus normal gray-white matter differentiation throughout the brain. Calcified atherosclerosis at the skull base. Dominant appearing distal left vertebral artery. CT CERVICAL SPINE FINDINGS Straightening of cervical lordosis. Visualized skull base is intact. No atlanto-occipital dissociation. Cervicothoracic junction alignment is within normal limits. Bilateral posterior element alignment is within normal limits. Moderate facet degeneration on the left at C5-C6 and C7-T1, including gas and fluid containing subchondral cysts. No cervical spine fracture identified. Grossly intact visualized upper thoracic levels. Thyromegaly with some loss of soft tissue planes about the thyroid at the thoracic inlet (series 6, image 69). Mild narrowing of the trachea at the thoracic inlet. No definite surrounding lymphadenopathy. Calcified carotid bifurcation atherosclerosis. Otherwise negative noncontrast paraspinal soft tissues. IMPRESSION: 1. Left superior scalp hematoma without underlying fracture. 2. No acute intracranial abnormality. Dystrophic calcification dorsal left thalamus, favor related to chronic small vessel disease. 3. No acute fracture or listhesis identified in the cervical spine. Ligamentous injury is not excluded. 4. Thyromegaly with suggestion of some loss of surrounding fat planes. Recommend nonemergent Thyroid Ultrasound evaluation. Electronically Signed   By: Genevie Ann M.D.   On: 05/23/2015 10:34   I have personally reviewed and evaluated these images and lab results as part of my medical decision-making.   EKG Interpretation None      MDM   Final diagnoses:  Fall, initial encounter  Scalp hematoma, initial encounter    Mechanical fall on plavix and apixaban. Well appearing and in no acute distress. Neuro exam in tact. Only scalp hematoma noted on exam. Do not suspect other  injury. CT head and cervical spine visualized and reviewed with radiology. Aside from scalp hematoma no other acute injuries involving the head and neck. Delayed bleeding warning signs reviewed. Appropriate for discharge home.     Forde Dandy, MD 05/23/15 1046

## 2015-05-23 NOTE — ED Notes (Signed)
Patient went into freezer at work slipped and hit her head on metal cart, no loc, hematoma noted to head

## 2015-05-26 NOTE — Telephone Encounter (Signed)
Recall for 6 months entered into EPIC

## 2015-05-26 NOTE — Telephone Encounter (Signed)
Unless this is for a life threatening illness, would prefer to wait 6 months after PCI to come off anticoagulation with Plavix for endocscopy.         Fransico Him    ----- Message -----     From: Greggory Keen, LPN     Sent: [id number]  2:41 PM      To: Sueanne Margarita, MD

## 2015-05-26 NOTE — Telephone Encounter (Signed)
Please schedule patient for follow up office visit in 6 months and can reevaluate at the time

## 2015-05-30 ENCOUNTER — Other Ambulatory Visit: Payer: Self-pay

## 2015-05-30 ENCOUNTER — Ambulatory Visit: Payer: Commercial Managed Care - HMO | Admitting: Cardiology

## 2015-05-30 ENCOUNTER — Ambulatory Visit (INDEPENDENT_AMBULATORY_CARE_PROVIDER_SITE_OTHER): Payer: Commercial Managed Care - HMO | Admitting: Physician Assistant

## 2015-05-30 ENCOUNTER — Encounter: Payer: Self-pay | Admitting: Family Medicine

## 2015-05-30 ENCOUNTER — Ambulatory Visit (INDEPENDENT_AMBULATORY_CARE_PROVIDER_SITE_OTHER): Payer: Commercial Managed Care - HMO | Admitting: Family Medicine

## 2015-05-30 ENCOUNTER — Encounter: Payer: Self-pay | Admitting: Physician Assistant

## 2015-05-30 VITALS — BP 138/79 | HR 90 | Temp 98.4°F | Resp 16 | Ht 64.25 in | Wt 166.8 lb

## 2015-05-30 VITALS — BP 130/58 | HR 86 | Ht 64.0 in | Wt 168.6 lb

## 2015-05-30 DIAGNOSIS — E1140 Type 2 diabetes mellitus with diabetic neuropathy, unspecified: Secondary | ICD-10-CM | POA: Diagnosis not present

## 2015-05-30 DIAGNOSIS — R0602 Shortness of breath: Secondary | ICD-10-CM | POA: Diagnosis not present

## 2015-05-30 DIAGNOSIS — Z114 Encounter for screening for human immunodeficiency virus [HIV]: Secondary | ICD-10-CM

## 2015-05-30 DIAGNOSIS — R079 Chest pain, unspecified: Secondary | ICD-10-CM | POA: Diagnosis not present

## 2015-05-30 DIAGNOSIS — I4891 Unspecified atrial fibrillation: Secondary | ICD-10-CM | POA: Diagnosis not present

## 2015-05-30 DIAGNOSIS — R251 Tremor, unspecified: Secondary | ICD-10-CM | POA: Diagnosis not present

## 2015-05-30 DIAGNOSIS — I2510 Atherosclerotic heart disease of native coronary artery without angina pectoris: Secondary | ICD-10-CM

## 2015-05-30 DIAGNOSIS — E785 Hyperlipidemia, unspecified: Secondary | ICD-10-CM | POA: Diagnosis not present

## 2015-05-30 DIAGNOSIS — R195 Other fecal abnormalities: Secondary | ICD-10-CM

## 2015-05-30 DIAGNOSIS — E0590 Thyrotoxicosis, unspecified without thyrotoxic crisis or storm: Secondary | ICD-10-CM

## 2015-05-30 DIAGNOSIS — I10 Essential (primary) hypertension: Secondary | ICD-10-CM | POA: Diagnosis not present

## 2015-05-30 MED ORDER — INSULIN DETEMIR 100 UNIT/ML FLEXPEN: 36.0000 [IU] | PEN_INJECTOR | Freq: Every day | SUBCUTANEOUS | Status: DC

## 2015-05-30 NOTE — Progress Notes (Signed)
Subjective:    Patient ID: Judith [name], female    DOB: Sep 26, 1950, 64 y.o.   MRN: [id number]  05/30/2015  Follow-up   HPI   Patient ID: Judith [name],  MRN: [id number], DOB/AGE: 28-May-1950 65 y.o.  Admit date: 04/29/2015 Discharge date: 05/01/2015  Primary Care Provider: [place] Primary Cardiologist: Dr. Radford Pax  Discharge Diagnoses Principal Problem:  Atrial fibrillation with RVR [facility]) Active Problems:  Diabetes ([place])  HTN (hypertension)  Coronary artery disease involving native coronary artery of native heart without angina pectoris  Hyperthyroidism  Tobacco abuse  Allergies Allergies  Allergen Reactions  . Novolog [Insulin Aspart] Shortness Of Breath and Other (See Comments)    "breathing problems"  . Codeine Nausea And Vomiting and Other (See Comments)    HIGH DOSES-SEVERE VOMITING  . Iodine Other (See Comments)    MUST HAVE BENADRYL PRIOR TO PROCEDURE AND RIGHT BEFORE TREATMENT TO COUNTERACT REACTION-BLISTERING REACTION DERMATOLOGICAL  . Ace Inhibitors Cough  . Neosporin [Neomycin-Bacitracin Zn-Polymyx] Itching and Rash    MAKES REACTIONS WORSE WHEN USING AS PROPHYLACTIC  . Penicillins Itching, Rash and Other (See Comments)    CHEST SIZED RASH AND ITCHING   . Tape Itching and Rash    Consultant: None  Procedures None  History of Present Illness  The patient is a 65 yo female with a hx of diabetes, HTN, tobacco abuse. She was evaluated by Dr. Radford Pax 4/15 for chest pain. Echo demonstrated normal LVF. Nuclear stress test was cancelled when her symptoms improved with PPI therapy. Last seen by Dr. Fransico Him 4/15. Admitted 10/5-10/6 with chest pain in the setting of UTI and viral gastroenteritis complicated by AKI and hypoNa. She was evaluated by Cardiology. CEs remained neg. Echo demonstrated mod LVH, EF 60-65%, no RWMA, Gr 1 DD, MAC. OP stress test was recommended. This was done 02/21/15  and demonstrated EF 60% with inf-lat and apical lateral ischemia (intermediate risk).  She underwent a cardiac cath on 02/27/15 which demonstrated 2 v CAD with CTO of the RCA and high grade bifurcational LCx/OM stenosis. She was brought back for PCI and underwent DES x 2 to the LCx/OM. She presented 04/29/2015 with A fib RVR. She reported her heart beating out of her chest at 1545hrs this afternoon. She was SOB, and nauseated with some discomfort up near her neck. She was a little dizzy when she stood up for the CXR. She denies vomiting, fever, chest pain, orthopnea, PND, cough, congestion, abdominal pain, hematochezia, melena, lower extremity edema, claudication. She was occult blood positive a couple weeks ago, no gross bleeding, and is scheduled to see GI in February.  On admission she was noted to be in A. fib with RVR at a rapid rate and had improvement in ventricular response with IV Cardizem in ED.   Hospital Course  She converted to NSR overnight and maintained sinus rhythm. Started on Eliquis for anticoagulation for CHA2DS2-VASc Score and unadjusted Ischemic Stroke Rate (% per year) is equal to 4.8 % stroke rate/year from a score of 4. As her stents were placed in October, Aspirin was stopped and continued on Plavix and started on Eliquis 5 mg twice a day. She did mention that she's had some guaiac positive stools recently and she is schedule to see Dr. Carlean Purl Feb 2017. Given initiation of anticoagulation, the patient was seen by GI and felt no overt melena or bleeding per rectum. However prefered to schedule the EGD and colonoscopy as out patient when safe for patient to hold anticoagulation and Plavix  for a few days given lack of overt GI bleed and symptoms. She will follow up as outpatient.   Dr. Radford Pax felt that her PCI was 02/27/2015 with DES so need to hold off GI evaluation until at least February and preferably May (9 months out). Will need to monitor  hemoglobin closely. Recent 2D echo with normal LVF.   TSH is 0.252. T4 elevated c/w hyperthyroidism. Discussed with IM (no formal consult) and since it is very mild they recommend following with repeat TSH/T4 in 3 months. Will defer to her PCP and have her followup with PCP in the next 1-2 weeks.   She has been seen by Dr. Radford Pax today and deemed ready for discharge home. All follow-up appointments have been scheduled. Discharge medications are listed below.     Discharge Vitals Blood pressure 158/85, pulse 75, temperature 98.2 F (36.8 C), temperature source Oral, resp. rate 18, height 5\' 4"  (1.626 m), weight 172 lb 6.4 oz (78.2 kg), SpO2 98 %.  Filed Weights   04/29/15 1758 04/29/15 2254  Weight: 173 lb (78.472 kg) 172 lb 6.4 oz (78.2 kg)    Labs  CBC  Recent Labs (last 2 labs)      Recent Labs  04/29/15 1814 05/01/15 0259  WBC 9.2 9.3  NEUTROABS 5.9 --   HGB 11.0* 10.9*  HCT 32.5* 34.3*  MCV 86.4 87.7  PLT 322 365     Basic Metabolic Panel  Recent Labs (last 2 labs)      Recent Labs  04/30/15 0429 05/01/15 0259  NA 137 134*  K 3.8 4.6  CL 103 101  CO2 23 24  GLUCOSE 215* 230*  BUN 11 15  CREATININE 0.59 0.69  CALCIUM 8.8* 9.0     Liver Function Tests  Recent Labs (last 2 labs)      Recent Labs  04/29/15 1814  AST 18  ALT 12*  ALKPHOS 111  BILITOT 0.2*  PROT 6.1*  ALBUMIN 3.1*      Recent Labs (last 2 labs)     No results for input(s): LIPASE, AMYLASE in the last 72 hours.   Cardiac Enzymes  Recent Labs (last 2 labs)      Recent Labs  04/29/15 1814  TROPONINI <0.03     Thyroid Function Tests  Recent Labs (last 2 labs)      Recent Labs  04/29/15 1814 04/30/15 0429  TSH 0.252* --   T3FREE --  4.5*      Disposition  Pt is being discharged home today in good condition.  Follow-up Plans & Appointments  Follow-up Information    Follow  up with [place] Throckmorton, MD. Schedule an appointment as soon as possible for a visit in 2 weeks.   Specialty: Family Medicine   Why: for hyperthyroidism   Contact information:   [place] [id number] [phone]       Follow up with HAGER, BRYAN, PA-C. Go on 05/30/2015.   Specialties: Physician Assistant, Radiology, Interventional Cardiology   Why: @3pm  as previously schedule    Contact information:   [address] [place] [address] [phone]       Follow up with Harl Bowie, MD. Schedule an appointment as soon as possible for a visit in 1 week.   Specialty: Gastroenterology   Contact information:   [place] [id number] [phone]           Discharge Instructions    Diet - low sodium heart  healthy  Complete by: As directed      Increase activity slowly  Complete by: As directed          F/u Labs/Studies: TSH and Free T4 with PCP  Discharge Medications    Medication List    STOP taking these medications       aspirin EC 81 MG tablet      TAKE these medications       apixaban 5 MG Tabs tablet  Commonly known as: ELIQUIS  Take 1 tablet (5 mg total) by mouth 2 (two) times daily.     atorvastatin 20 MG tablet  Commonly known as: LIPITOR  Take 1 tablet (20 mg total) by mouth daily.     buPROPion 150 MG 12 hr tablet  Commonly known as: WELLBUTRIN SR  TAKE ONE TABLET BY MOUTH TWICE DAILY     clopidogrel 75 MG tablet  Commonly known as: PLAVIX  Take 1 tablet (75 mg total) by mouth daily.     cyclobenzaprine 10 MG tablet  Commonly known as: FLEXERIL  TAKE ONE TABLET BY MOUTH AT BEDTIME     diltiazem 240 MG 24 hr capsule  Commonly known as: CARDIZEM CD  Take 1 capsule (240 mg total) by mouth daily.     FLUoxetine 20 MG capsule  Commonly known as: PROZAC  TAKE ONE CAPSULE BY MOUTH  ONCE DAILY     gabapentin 300 MG capsule  Commonly known as: NEURONTIN  Three capsules at lunchtime; take three capsules at bedtime     glipiZIDE 5 MG 24 hr tablet  Commonly known as: GLUCOTROL XL  Take 1 tablet (5 mg total) by mouth daily with lunch.     Insulin Detemir 100 UNIT/ML Pen  Commonly known as: LEVEMIR  Inject 20-25 Units into the skin daily at 10 pm.     insulin lispro 100 UNIT/ML injection  Commonly known as: HUMALOG  Inject 0.05-0.1 mLs (5-10 Units total) into the skin 3 (three) times daily with meals.     Insulin Pen Needle 32G X 6 MM Misc  Use as directed     ipratropium 0.03 % nasal spray  Commonly known as: ATROVENT  Place 2 sprays into both nostrils 2 (two) times daily.     losartan 50 MG tablet  Commonly known as: COZAAR  Take 1 tablet (50 mg total) by mouth daily.     metFORMIN 1000 MG tablet  Commonly known as: GLUCOPHAGE  TAKE ONE TABLET BY MOUTH TWICE DAILY WITH MEALS     metoprolol succinate 50 MG 24 hr tablet  Commonly known as: TOPROL-XL  Take 1 tablet (50 mg total) by mouth daily. Take with or immediately following a meal.     nitroGLYCERIN 0.4 MG SL tablet  Commonly known as: NITROSTAT  Place 1 tablet (0.4 mg total) under the tongue every 5 (five) minutes as needed for chest pain.     ondansetron 4 MG tablet  Commonly known as: ZOFRAN  Take 1 tablet (4 mg total) by mouth every 8 (eight) hours as needed for nausea or vomiting.     pantoprazole 40 MG tablet  Commonly known as: PROTONIX  Take 1 tablet (40 mg total) by mouth daily.     PROVENTIL HFA 108 (90 BASE) MCG/ACT inhaler  Generic drug: albuterol  Inhale into the lungs every 6 (six) hours as needed for wheezing or shortness of breath.     traMADol 50 MG tablet  Commonly known as: ULTRAM  Take 1  tablet by mouth in the afternoon and 2 tablets at bedtime.     triamcinolone cream 0.1 %  Commonly  known as: KENALOG  Apply 1 application topically 2 (two) times daily. May mix 1:1 with eurerin cream at home        Duration of Discharge Encounter   Greater than 30 minutes including physician time.  Signed, Bhagat,Bhavinkumar PA-C 05/01/2015, 9:52 AM       CAD: s/p stenting after cardiac catheterization for abnormal stress testing.   Placed on Plavix after stents placed.  Has never had chest pain other than indigestion.  S/p stress testing; felt great.  Lipitor decreased from 40mg  to 20mg  daily.   Atrial fibrillation with RVR:  S/p admission; developed tachycardia with SOB.  Admitted.  Placed Eliquis.    Bloody stools: to schedule colonoscopy and EGD in 08/2015.  S/p GI consultations.    Head trauma/fall: last week.  S/p CT head. Father with vascular dementia.    Gastroenteritis:  Suffered with a little virus; was vomiting with diarrhea; then developed dizziness; presented to ED, sugar increased to 400 and administered 4 liters of fluids.    Shaking: B hands; and now feet doing it.  Toes curling up.  Thinks statin prescribed is affecting mind.  Took statin in 90s and stopped due to mind issues. No confusion.  Using the wrong words when describing an object.  Started after the statin.  [place] primary physician described hereditary issue; father and mother had tremor.  Feet have started shaking; first noticed during first admission.  There is a simple test for memory.      Thyroid function abnormal:  TSH 0.7 02/12/15.  Repeat TSH 0.294 in 04/13/15.  Free T4.    DMII: HgbA1c 8.5.  Levemir 30mg  daily [place] for six weeks.  Novolog 5units qac.  Fasting sugars 300.  Drinking a meal replacement protein shake for breakfast.  Then eats when gets home; eats process meat/sausage, then cathy eating tacos.    PB sandwich last night.  Tye Maryland is a patient at [facility] also.     Review of Systems  Constitutional: Negative for fever, chills, diaphoresis and fatigue.  Eyes: Negative for visual disturbance.    Respiratory: Negative for cough and shortness of breath.   Cardiovascular: Negative for chest pain, palpitations and leg swelling.  Gastrointestinal: Negative for nausea, vomiting, abdominal pain, diarrhea and constipation.  Endocrine: Negative for cold intolerance, heat intolerance, polydipsia, polyphagia and polyuria.  Neurological: Negative for dizziness, tremors, seizures, syncope, facial asymmetry, speech difficulty, weakness, light-headedness, numbness and headaches.    Past Medical History  Diagnosis Date  . Hypertension   . Goiter   . Chronic kidney disease   . History of shingles 06/01/2013  . Abnormal EKG 07/31/2013  . GERD (gastroesophageal reflux disease)   . Carpal tunnel syndrome, bilateral   . Coronary artery disease   . Hyperlipidemia   . Anginal pain ([place])   . Asthma   . Pneumonia ~ 1976  . Type II diabetes mellitus ([facility])     insulin dependent  . Arthritis     "hands" (03/06/2015)  . Diabetic peripheral neuropathy ([place]) "since 1996"  . Atrial fibrillation with RVR ([place]) 04/29/2015   Past Surgical History  Procedure Laterality Date  . Tonsillectomy  1976  . Cesarean section  1982; 1984  . Carpal tunnel release Right Nov 2015  . Knee arthroscopy Right ~ 2003    "meniscus repair"  . Shoulder open rotator cuff repair Right 1996; 1998    "  w/fracture repair"  . Foot neuroma surgery Bilateral 2000  . Carpal tunnel release Right 1992; 05/2014    [name]; [place]  . Thyroid surgery  2000    "removed lots of nodules"  . Cardiac catheterization N/A 02/27/2015    Procedure: Left Heart Cath and Coronary Angiography;  Surgeon: [name], MD; LAD 40%, mCFX 80%, OM 70%, RCA 100% calcified       . Cardiac catheterization N/A 03/06/2015    Procedure: Coronary Stent Intervention;  Surgeon: [name], MD;  Location: [facility];  Service: Cardiovascular;  Laterality: N/A;  Mid CX 3.50x12 promus DES w/ 0% resdual and Prox OM1 2.50x20 promus DES w/ 20%  residual  . Abdominal hysterectomy  1988    age 77; CERVICAL DYSPLASIA; ovaries intact.    Allergies  Allergen Reactions  . Novolog [Insulin Aspart] Shortness Of Breath and Other (See Comments)    "breathing problems"  . Codeine Nausea And Vomiting and Other (See Comments)    HIGH DOSES-SEVERE VOMITING  . Iodine Other (See Comments)    MUST HAVE BENADRYL PRIOR TO PROCEDURE AND RIGHT BEFORE TREATMENT TO COUNTERACT REACTION-BLISTERING REACTION DERMATOLOGICAL  . Ace Inhibitors Cough  . Neosporin [Neomycin-Bacitracin Zn-Polymyx] Itching and Rash    MAKES REACTIONS WORSE WHEN USING AS PROPHYLACTIC  . Penicillins Itching, Rash and Other (See Comments)    CHEST SIZED RASH AND ITCHING   . Tape Itching and Rash   Current Outpatient Prescriptions  Medication Sig Dispense Refill  . albuterol (PROVENTIL HFA) 108 (90 BASE) MCG/ACT inhaler Inhale into the lungs every 6 (six) hours as needed for wheezing or shortness of breath.    Marland Kitchen apixaban (ELIQUIS) 5 MG TABS tablet Take 1 tablet (5 mg total) by mouth 2 (two) times daily. 60 tablet 11  . atorvastatin (LIPITOR) 20 MG tablet Take 1 tablet (20 mg total) by mouth daily. 90 tablet 1  . buPROPion (WELLBUTRIN SR) 150 MG 12 hr tablet TAKE ONE TABLET BY MOUTH TWICE DAILY 60 tablet 5  . clopidogrel (PLAVIX) 75 MG tablet Take 1 tablet (75 mg total) by mouth daily. 30 tablet 11  . cyclobenzaprine (FLEXERIL) 5 MG tablet Take 1 tablet (5 mg total) by mouth 3 (three) times daily as needed for muscle spasms. 30 tablet 0  . diltiazem (CARDIZEM CD) 240 MG 24 hr capsule Take 1 capsule (240 mg total) by mouth daily. 30 capsule 11  . FLUoxetine (PROZAC) 20 MG capsule TAKE ONE CAPSULE BY MOUTH ONCE DAILY 30 capsule 5  . gabapentin (NEURONTIN) 300 MG capsule Three capsules at lunchtime; take three capsules at bedtime (Patient taking differently: Take 600-900 mg by mouth 2 (two) times daily. TAKES 2 CAPS IN AM AND 3 CAPS AT BEDTIME) 180 capsule 5  . glipiZIDE (GLUCOTROL  XL) 5 MG 24 hr tablet Take 1 tablet (5 mg total) by mouth daily with lunch. 90 tablet 1  . Insulin Detemir (LEVEMIR) 100 UNIT/ML Pen Inject 20-25 Units into the skin daily at 10 pm. (Patient taking differently: Inject 30 Units into the skin daily at 10 pm. ) 15 mL 11  . insulin lispro (HUMALOG) 100 UNIT/ML injection Inject 0.05-0.1 mLs (5-10 Units total) into the skin 3 (three) times daily with meals. (Patient taking differently: Inject 3-5 Units into the skin 3 (three) times daily with meals. ) 3 mL 0  . losartan (COZAAR) 50 MG tablet Take 1 tablet (50 mg total) by mouth daily. 30 tablet 11  . metFORMIN (GLUCOPHAGE) 1000 MG tablet  TAKE ONE TABLET BY MOUTH TWICE DAILY WITH MEALS 60 tablet 5  . metoprolol succinate (TOPROL-XL) 50 MG 24 hr tablet Take 1 tablet (50 mg total) by mouth daily. Take with or immediately following a meal. 30 tablet 11  . naproxen sodium (ANAPROX DS) 550 MG tablet Take 1 tablet (550 mg total) by mouth 2 (two) times daily with a meal. 40 tablet 0  . nitroGLYCERIN (NITROSTAT) 0.4 MG SL tablet Place 1 tablet (0.4 mg total) under the tongue every 5 (five) minutes as needed for chest pain. 25 tablet 3  . ondansetron (ZOFRAN) 4 MG tablet Take 1 tablet (4 mg total) by mouth every 8 (eight) hours as needed for nausea or vomiting. 20 tablet 0  . pantoprazole (PROTONIX) 40 MG tablet Take 1 tablet (40 mg total) by mouth daily. 30 tablet 11  . traMADol (ULTRAM) 50 MG tablet Take 1 tablet by mouth in the afternoon and 2 tablets at bedtime. (Patient taking differently: Take 100 mg by mouth at bedtime. ) 90 tablet 5  . triamcinolone cream (KENALOG) 0.1 % Apply 1 application topically 2 (two) times daily. May mix 1:1 with eurerin cream at home 454 g 0  . cyclobenzaprine (FLEXERIL) 10 MG tablet TAKE ONE TABLET BY MOUTH AT BEDTIME (Patient not taking: Reported on 05/30/2015) 90 tablet 1  . HYDROcodone-acetaminophen (NORCO) 5-325 MG tablet Take 1-2 tablets by mouth every 4 (four) hours as needed.  (Patient not taking: Reported on 05/30/2015) 30 tablet 0  . Insulin Pen Needle 32G X 6 MM MISC Use as directed 50 each 5  . ipratropium (ATROVENT) 0.03 % nasal spray Place 2 sprays into both nostrils 2 (two) times daily. (Patient not taking: Reported on 05/23/2015) 30 mL 0   No current facility-administered medications for this visit.   Social History   Social History  . Marital Status: Divorced    Spouse Name: N/A  . Number of Children: N/A  . Years of Education: N/A   Occupational History  . Food Therapist, nutritional    Social History Main Topics  . Smoking status: Former Smoker -- 0.50 packs/day for 41 years    Types: Cigarettes  . Smokeless tobacco: Never Used     Comment: 04/29/2015 "quit smoking cigarettes 02/27/2015"  . Alcohol Use: No  . Drug Use: No  . Sexual Activity: Not Currently    Birth Control/ Protection: Post-menopausal, Surgical   Other Topics Concern  . 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      Lives with sister in [place].      Tobacco: 1 ppd x 41 years      Alcohol: none      Drugs: none      Exercise:  Walking in neighborhood; physical job.      Family History  Problem Relation Age of Onset  . Cancer Mother 62    bronchial cancer  . Hypertension Father   . COPD Father   . Heart disease Father 41    CAD with cardiac stenting  . Heart attack Father   . Allergies Sister   . Breast cancer Maternal Grandmother   . Emphysema Maternal Grandfather   . Leukemia Paternal Grandmother   . Emphysema Paternal Grandfather  Objective:    BP 138/79 mmHg  Pulse 90  Temp(Src) 98.4 F (36.9 C) (Oral)  Resp 16  Ht 5' 4.25" (1.632 m)  Wt 166 lb 12.8 oz (75.66 kg)  BMI 28.41 kg/m2  SpO2 98% Physical Exam    Constitutional: She is oriented to person, place, and time. She appears well-developed and well-nourished. No distress.  HENT:  Head: Normocephalic and atraumatic.  Right Ear: External ear normal.  Left Ear: External ear normal.  Nose: Nose normal.  Mouth/Throat: Oropharynx is clear and moist.  Eyes: Conjunctivae and EOM are normal. Pupils are equal, round, and reactive to light.  Neck: Normal range of motion. Neck supple. Carotid bruit is not present. No thyromegaly present.  Cardiovascular: Normal rate, regular rhythm, normal heart sounds and intact distal pulses.  Exam reveals no gallop and no friction rub.   No murmur heard. Pulmonary/Chest: Effort normal and breath sounds normal. She has no wheezes. She has no rales.  Abdominal: Soft. Bowel sounds are normal. She exhibits no distension and no mass. There is no tenderness. There is no rebound and no guarding.  Lymphadenopathy:    She has no cervical adenopathy.  Neurological: She is alert and oriented to person, place, and time. No cranial nerve deficit.  Skin: Skin is warm and dry. No rash noted. She is not diaphoretic. No erythema. No pallor.  Psychiatric: She has a normal mood and affect. Her behavior is normal.   Results for orders placed or performed during the hospital encounter of 05/23/15  Protime-INR  Result Value Ref Range   Prothrombin Time 13.7 11.6 - 15.2 seconds   INR 1.03 0.00 - 1.49       Assessment & Plan:  No diagnosis found.  No orders of the defined types were placed in this encounter.   No orders of the defined types were placed in this encounter.    No Follow-up on file.    Kaelyn Innocent Elayne Guerin, M.D. [facility] [address] [place], [place]  [address] [phone] phone [phone] fax

## 2015-05-30 NOTE — Patient Instructions (Signed)

## 2015-05-30 NOTE — Patient Instructions (Signed)
Medication Instructions:  Your physician recommends that you continue on your current medications as directed. Please refer to the Current Medication list given to you today.   Labwork: None ordered  Testing/Procedures: None ordered  Follow-Up: Your physician recommends that you schedule a follow-up appointment in: April WITH DR. Radford Pax   Any Other Special Instructions Will Be Listed Below (If Applicable). I recommend daily exercises 45-60 minutes each day    If you need a refill on your cardiac medications before your next appointment, please call your pharmacy.

## 2015-05-30 NOTE — Telephone Encounter (Signed)
Dosage on fax was for 1 tab Q12 hrs, but the pended dosage was on med list today at [facility].

## 2015-05-30 NOTE — Progress Notes (Signed)
Patient ID: Judith [name], female   DOB: 12-14-1950, 65 y.o.   MRN: [id number]    Date:  05/30/2015   ID:  Judith [name], DOB 09-Oct-1950, MRN [id number]  PCP:  Reginia Forts, MD  Primary Cardiologist:  Radford Pax   chief complaint: Post hospital follow-up   History of Present Illness: Judith [name] is a 65 y.o. female with a hx of CAD, diabetes, HTN, tobacco abuse. She was admitted 10/5-10/6 with chest pain in the setting of UTI and viral gastroenteritis complicated by AKI and hypoNa. She was evaluated by Cardiology. CEs remained neg. Echo demonstrated mod LVH, EF 60-65%, no RWMA, Gr 1 DD, MAC. Stress test was done 02/21/15 and demonstrated EF 60% with inf-lat and apical lateral ischemia (intermediate risk).  She underwent a cardiac cath on 02/27/15 which demonstrated 2 v CAD with CTO of the RCA and high grade bifurcational LCx/OM stenosis. She was brought back for PCI and underwent DES x 2 to the LCx/OM. She presented 04/29/2015 with A fib RVR and had improvement in ventricular response with IV Cardizem in ED. She was occult blood positive a couple weeks ago, no gross bleeding, and is scheduled to see GI in February.  She converted to NSR overnight and maintained sinus rhythm. Started on Eliquis 5mg  for anticoagulation for CHA2DS2-VASc Score and unadjusted Ischemic Stroke Rate (% per year) is equal to 4.8 % stroke rate/year from a score of 4.  Since her stents were placed in October, Aspirin was stopped and continued on Plavix and started on Eliquis 5 mg twice a day. She did mention that she's had some guaiac positive stools recently and she is schedule to see Dr. Carlean Purl Feb 2017. Given initiation of anticoagulation, the patient was seen by GI and felt no overt melena or bleeding per rectum. However prefered to schedule the EGD and colonoscopy as out patient when safe for patient to hold anticoagulation and Plavix for a few days given lack of overt GI bleed and symptoms.    Given her PCI was 02/27/2015 with DES she will need to hold off GI evaluation until at least February and preferably May (9 months out). Recent 2D echo with normal LVF.   TSH is 0.252. T4 elevated c/w hyperthyroidism. Discussed with IM (no formal consult) and since it is very mild they recommend following with repeat TSH/T4 in 3 months.  Judith [name] for posthospital follow-up. She reports doing very well however, she did fall while she was at work on May 23, 2015 and hit her head. She had a CT scan in the ER which showed a left superior scalp hematoma without underlying fracture. There was no acute intracranial abnormality or bleeding.    She is currently walking about a mile a day denies any chest pain shortness of breath  As well as nausea, vomiting, fever, palpitations, orthopnea, dizziness, PND, cough, congestion, abdominal pain, hematochezia, melena, lower extremity edema, claudication.   Dr. Silverio Decamp would like to do an EGD and colonoscopy once safe to hold anticoagulation for a few days.  Wt Readings from Last 3 Encounters:  05/30/15 168 lb 9.6 oz (76.476 kg)  05/30/15 166 lb 12.8 oz (75.66 kg)  05/23/15 163 lb (73.936 kg)     Past Medical History  Diagnosis Date  . Hypertension   . Goiter   . Chronic kidney disease   . History of shingles 06/01/2013  . Abnormal EKG 07/31/2013  . GERD (gastroesophageal reflux disease)   . Carpal tunnel syndrome, bilateral   . Coronary  artery disease   . Hyperlipidemia   . Anginal pain ([place])   . Asthma   . Pneumonia ~ 1976  . Type II diabetes mellitus (HCC)     insulin dependent  . Arthritis     "hands" (03/06/2015)  . Diabetic peripheral neuropathy ([place]) "since 1996"  . Atrial fibrillation with RVR ([place]) 04/29/2015    Current Outpatient Prescriptions  Medication Sig Dispense Refill  . albuterol (PROVENTIL HFA) 108 (90 BASE) MCG/ACT inhaler Inhale into the lungs every 6 (six) hours as needed for wheezing or shortness of breath.     Marland Kitchen apixaban (ELIQUIS) 5 MG TABS tablet Take 1 tablet (5 mg total) by mouth 2 (two) times daily. 60 tablet 11  . atorvastatin (LIPITOR) 20 MG tablet Take 1 tablet (20 mg total) by mouth daily. 90 tablet 1  . buPROPion (WELLBUTRIN SR) 150 MG 12 hr tablet TAKE ONE TABLET BY MOUTH TWICE DAILY 60 tablet 5  . clopidogrel (PLAVIX) 75 MG tablet Take 1 tablet (75 mg total) by mouth daily. 30 tablet 11  . cyclobenzaprine (FLEXERIL) 5 MG tablet Take 5 mg by mouth 3 (three) times daily as needed for muscle spasms.    Marland Kitchen diltiazem (CARDIZEM CD) 240 MG 24 hr capsule Take 1 capsule (240 mg total) by mouth daily. 30 capsule 11  . FLUoxetine (PROZAC) 20 MG capsule TAKE ONE CAPSULE BY MOUTH ONCE DAILY 30 capsule 5  . gabapentin (NEURONTIN) 300 MG capsule Take 300 mg by mouth 3 (three) times daily. Patient take 3 capsules by mouth at lunchtime and three capsules by mouth at bedtime.    Marland Kitchen glipiZIDE (GLUCOTROL XL) 5 MG 24 hr tablet Take 1 tablet (5 mg total) by mouth daily with lunch. 90 tablet 1  . Insulin Detemir (LEVEMIR) 100 UNIT/ML Pen Inject 36 Units into the skin daily at 10 pm. 15 mL 11  . insulin lispro (HUMALOG) 100 UNIT/ML injection Inject 3-5 Units into the skin 3 (three) times daily before meals.    . Insulin Pen Needle 32G X 6 MM MISC Use as directed 50 each 5  . losartan (COZAAR) 50 MG tablet Take 1 tablet (50 mg total) by mouth daily. 30 tablet 11  . metFORMIN (GLUCOPHAGE) 1000 MG tablet TAKE ONE TABLET BY MOUTH TWICE DAILY WITH MEALS 60 tablet 5  . metoprolol succinate (TOPROL-XL) 50 MG 24 hr tablet Take 1 tablet (50 mg total) by mouth daily. Take with or immediately following a meal. 30 tablet 11  . nitroGLYCERIN (NITROSTAT) 0.4 MG SL tablet Place 1 tablet (0.4 mg total) under the tongue every 5 (five) minutes as needed for chest pain. 25 tablet 3  . ondansetron (ZOFRAN) 4 MG tablet Take 1 tablet (4 mg total) by mouth every 8 (eight) hours as needed for nausea or vomiting. 20 tablet 0  . pantoprazole  (PROTONIX) 40 MG tablet Take 1 tablet (40 mg total) by mouth daily. 30 tablet 11  . traMADol (ULTRAM) 50 MG tablet Take 50 mg by mouth 3 (three) times daily.    Marland Kitchen triamcinolone cream (KENALOG) 0.1 % Apply 1 application topically 2 (two) times daily. May mix 1:1 with eurerin cream at home 454 g 0   No current facility-administered medications for this visit.    Allergies:    Allergies  Allergen Reactions  . Novolog [Insulin Aspart] Shortness Of Breath and Other (See Comments)    "breathing problems"  . Codeine Nausea And Vomiting and Other (See Comments)    HIGH DOSES-SEVERE VOMITING  .  Iodine Other (See Comments)    MUST HAVE BENADRYL PRIOR TO PROCEDURE AND RIGHT BEFORE TREATMENT TO COUNTERACT REACTION-BLISTERING REACTION DERMATOLOGICAL  . Ace Inhibitors Cough  . Neosporin [Neomycin-Bacitracin Zn-Polymyx] Itching and Rash    MAKES REACTIONS WORSE WHEN USING AS PROPHYLACTIC  . Penicillins Itching, Rash and Other (See Comments)    CHEST SIZED RASH AND ITCHING   . Tape Itching and Rash    Social History:  The patient  reports that she has quit smoking. Her smoking use included Cigarettes. She has a 20.5 pack-year smoking history. She has never used smokeless tobacco. She reports that she does not drink alcohol or use illicit drugs.   Family history:   Family History  Problem Relation Age of Onset  . Cancer Mother 24    bronchial cancer  . Hypertension Father   . COPD Father   . Heart disease Father 16    CAD with cardiac stenting  . Heart attack Father   . Allergies Sister   . Breast cancer Maternal Grandmother   . Emphysema Maternal Grandfather   . Leukemia Paternal Grandmother   . Emphysema Paternal Grandfather     ROS:  Please see the history of present illness.  All other systems reviewed and negative.   PHYSICAL EXAM: VS:  BP 130/58 mmHg  Pulse 86  Ht 5\' 4"  (1.626 m)  Wt 168 lb 9.6 oz (76.476 kg)  BMI 28.93 kg/m2 Well nourished, well developed, in no acute  distress HEENT: Pupils are equal round react to light accommodation extraocular movements are intact.  Neck: no JVDNo cervical lymphadenopathy. Cardiac: Regular rate and rhythm without murmurs rubs or gallops. Lungs:  clear to auscultation bilaterally, no wheezing, rhonchi or rales Abd: soft, nontender, positive bowel sounds all quadrants, no hepatosplenomegaly Ext: no lower extremity edema.  2+ radial and dorsalis pedis pulses. Skin: warm and dry Neuro:  Grossly normal  EKG:   Normal sinus rhythm rate 86 bpm inferior Q waves.  ASSESSMENT AND PLAN:  Problem List Items Addressed This Visit    HTN (hypertension)   Coronary artery disease involving native coronary artery of native heart without angina pectoris   Relevant Orders   EKG 12-Lead   Chest pain   Relevant Orders   EKG 12-Lead   Atrial fibrillation with RVR ([place])    Other Visit Diagnoses    SOB (shortness of breath)    -  Primary    Relevant Orders    EKG 12-Lead      Judith [name] is doing well since having successful bifurcation stenting of the left circumflex/obtuse marginal bifurcation using drug-eluting stent.   I recommended that she start increasing her daily walking in trying target 45-60 minutes daily. she is currently walking 1 mile.  She is maintaining normal sinus rhythm on Cardizem 240 mg metoprolol XL 50 mg.  She denies any bloody or melenic stools.  She continues to take eliquis and Plavix. She had a comprehensive metabolic panel, CBC, thyroid studies drawn at her primary care provider today.    The pressures controlled also on Cozaar 50 mg.   Her gastroenterologist would like to do an EGD and colonoscopy when safe to hold her anticoagulation and antiplatelets.  In May it will have been 6 months since stenting.   I'll have her follow-up with Dr. Radford Pax in April.

## 2015-06-02 LAB — CBC WITH DIFFERENTIAL/PLATELET
Basophils Absolute: 0.1 10*3/uL (ref 0.0–0.1)
Basophils Relative: 1 % (ref 0–1)
Eosinophils Absolute: 0.2 10*3/uL (ref 0.0–0.7)
Eosinophils Relative: 4 % (ref 0–5)
HEMATOCRIT: 36.9 % (ref 36.0–46.0)
HEMOGLOBIN: 12.7 g/dL (ref 12.0–15.0)
LYMPHS PCT: 34 % (ref 12–46)
Lymphs Abs: 1.9 10*3/uL (ref 0.7–4.0)
MCH: 28.7 pg (ref 26.0–34.0)
MCHC: 34.4 g/dL (ref 30.0–36.0)
MCV: 83.5 fL (ref 78.0–100.0)
MONO ABS: 0.6 10*3/uL (ref 0.1–1.0)
MONOS PCT: 10 % (ref 3–12)
MPV: 9.7 fL (ref 8.6–12.4)
NEUTROS ABS: 2.9 10*3/uL (ref 1.7–7.7)
Neutrophils Relative %: 51 % (ref 43–77)
Platelets: 331 10*3/uL (ref 150–400)
RBC: 4.42 MIL/uL (ref 3.87–5.11)
RDW: 14 % (ref 11.5–15.5)
WBC: 5.6 10*3/uL (ref 4.0–10.5)

## 2015-06-02 LAB — LIPID PANEL
CHOLESTEROL: 171 mg/dL (ref 125–200)
HDL: 39 mg/dL — AB (ref >=46)
LDL Cholesterol: 65 mg/dL (ref <130)
TRIGLYCERIDES: 334 mg/dL — AB (ref <150)
Total CHOL/HDL Ratio: 4.4 Ratio (ref <=5.0)
VLDL: 67 mg/dL — ABNORMAL HIGH (ref <30)

## 2015-06-02 LAB — COMPREHENSIVE METABOLIC PANEL
ALBUMIN: 4.2 g/dL (ref 3.6–5.1)
ALT: 11 U/L (ref 6–29)
AST: 14 U/L (ref 10–35)
Alkaline Phosphatase: 83 U/L (ref 33–130)
BILIRUBIN TOTAL: 0.3 mg/dL (ref 0.2–1.2)
BUN: 21 mg/dL (ref 7–25)
CALCIUM: 9.9 mg/dL (ref 8.6–10.4)
CHLORIDE: 97 mmol/L — AB (ref 98–110)
CO2: 27 mmol/L (ref 20–31)
Creat: 0.77 mg/dL (ref 0.50–0.99)
Glucose, Bld: 102 mg/dL — ABNORMAL HIGH (ref 65–99)
Potassium: 4.5 mmol/L (ref 3.5–5.3)
Sodium: 136 mmol/L (ref 135–146)
Total Protein: 7 g/dL (ref 6.1–8.1)

## 2015-06-02 LAB — TSH: TSH: 0.291 u[IU]/mL — ABNORMAL LOW (ref 0.350–4.500)

## 2015-06-02 LAB — HIV ANTIBODY (ROUTINE TESTING W REFLEX): HIV: NONREACTIVE

## 2015-06-02 LAB — T4, FREE: FREE T4: 1.16 ng/dL (ref 0.80–1.80)

## 2015-06-03 MED ORDER — TRAMADOL HCL 50 MG PO TABS: ORAL_TABLET | ORAL | Status: DC

## 2015-06-03 NOTE — Telephone Encounter (Signed)
Called in.

## 2015-06-03 NOTE — Telephone Encounter (Signed)
My office note from 03-28-15 states that patient taking Tramadol qhs only.  I have approved 60 tablets for one month.

## 2015-06-16 NOTE — Addendum Note (Signed)
Addended by: [name] on: 06/16/2015 08:18 AM   Modules accepted: Orders

## 2015-06-18 ENCOUNTER — Other Ambulatory Visit: Payer: Self-pay

## 2015-06-18 DIAGNOSIS — Z760 Encounter for issue of repeat prescription: Secondary | ICD-10-CM

## 2015-06-18 MED ORDER — INSULIN LISPRO 100 UNIT/ML ~~LOC~~ SOLN: 5.0000 [IU] | Freq: Three times a day (TID) | SUBCUTANEOUS | Status: DC

## 2015-06-18 MED ORDER — BUPROPION HCL ER (SR) 150 MG PO TB12
ORAL_TABLET | ORAL | Status: DC
Start: 2015-06-18 — End: 2015-11-07

## 2015-06-18 MED ORDER — FLUOXETINE HCL 20 MG PO CAPS
ORAL_CAPSULE | ORAL | Status: DC
Start: 2015-06-18 — End: 2015-08-28

## 2015-06-18 MED ORDER — METFORMIN HCL 1000 MG PO TABS: ORAL_TABLET | ORAL | Status: DC

## 2015-06-23 ENCOUNTER — Ambulatory Visit: Payer: Commercial Managed Care - HMO | Admitting: Internal Medicine

## 2015-06-24 ENCOUNTER — Encounter: Payer: Self-pay | Admitting: Endocrinology

## 2015-06-30 ENCOUNTER — Ambulatory Visit: Payer: Self-pay | Admitting: Family Medicine

## 2015-07-01 ENCOUNTER — Encounter (INDEPENDENT_AMBULATORY_CARE_PROVIDER_SITE_OTHER): Payer: Commercial Managed Care - HMO | Admitting: Family Medicine

## 2015-07-02 ENCOUNTER — Encounter: Payer: Self-pay | Admitting: Gastroenterology

## 2015-07-08 ENCOUNTER — Ambulatory Visit: Payer: Commercial Managed Care - HMO | Admitting: Physician Assistant

## 2015-07-09 ENCOUNTER — Encounter: Payer: Self-pay | Admitting: Endocrinology

## 2015-07-17 ENCOUNTER — Emergency Department (HOSPITAL_COMMUNITY)
Admission: EM | Admit: 2015-07-17 | Discharge: 2015-07-17 | Disposition: A | Discharge status: Home / Self Care | Payer: Commercial Managed Care - HMO | Attending: Emergency Medicine | Admitting: Emergency Medicine

## 2015-07-17 ENCOUNTER — Telehealth: Payer: Self-pay | Admitting: Gastroenterology

## 2015-07-17 ENCOUNTER — Encounter (HOSPITAL_COMMUNITY): Payer: Self-pay | Admitting: Cardiology

## 2015-07-17 DIAGNOSIS — R109 Unspecified abdominal pain: Secondary | ICD-10-CM | POA: Insufficient documentation

## 2015-07-17 DIAGNOSIS — I129 Hypertensive chronic kidney disease with stage 1 through stage 4 chronic kidney disease, or unspecified chronic kidney disease: Secondary | ICD-10-CM | POA: Diagnosis not present

## 2015-07-17 DIAGNOSIS — I2510 Atherosclerotic heart disease of native coronary artery without angina pectoris: Secondary | ICD-10-CM | POA: Diagnosis not present

## 2015-07-17 DIAGNOSIS — K921 Melena: Secondary | ICD-10-CM | POA: Insufficient documentation

## 2015-07-17 DIAGNOSIS — J45909 Unspecified asthma, uncomplicated: Secondary | ICD-10-CM | POA: Insufficient documentation

## 2015-07-17 DIAGNOSIS — E119 Type 2 diabetes mellitus without complications: Secondary | ICD-10-CM | POA: Diagnosis not present

## 2015-07-17 DIAGNOSIS — N189 Chronic kidney disease, unspecified: Secondary | ICD-10-CM | POA: Insufficient documentation

## 2015-07-17 DIAGNOSIS — K625 Hemorrhage of anus and rectum: Secondary | ICD-10-CM | POA: Diagnosis present

## 2015-07-17 LAB — COMPREHENSIVE METABOLIC PANEL
ALK PHOS: 84 U/L (ref 38–126)
ALT: 18 U/L (ref 14–54)
AST: 20 U/L (ref 15–41)
Albumin: 4 g/dL (ref 3.5–5.0)
Anion gap: 14 (ref 5–15)
BUN: 19 mg/dL (ref 6–20)
CALCIUM: 9.6 mg/dL (ref 8.9–10.3)
CO2: 24 mmol/L (ref 22–32)
CREATININE: 0.88 mg/dL (ref 0.44–1.00)
Chloride: 101 mmol/L (ref 101–111)
Glucose, Bld: 87 mg/dL (ref 65–99)
Potassium: 4.6 mmol/L (ref 3.5–5.1)
Sodium: 139 mmol/L (ref 135–145)
Total Bilirubin: 0.5 mg/dL (ref 0.3–1.2)
Total Protein: 7.2 g/dL (ref 6.5–8.1)

## 2015-07-17 LAB — CBC
HCT: 37.9 % (ref 36.0–46.0)
Hemoglobin: 12.4 g/dL (ref 12.0–15.0)
MCH: 27.2 pg (ref 26.0–34.0)
MCHC: 32.7 g/dL (ref 30.0–36.0)
MCV: 83.1 fL (ref 78.0–100.0)
PLATELETS: 331 10*3/uL (ref 150–400)
RBC: 4.56 MIL/uL (ref 3.87–5.11)
RDW: 13.9 % (ref 11.5–15.5)
WBC: 9.5 10*3/uL (ref 4.0–10.5)

## 2015-07-17 LAB — TYPE AND SCREEN
ABO/RH(D): O POS
Antibody Screen: NEGATIVE

## 2015-07-17 LAB — PROTIME-INR
INR: 1.09 (ref 0.00–1.49)
PROTHROMBIN TIME: 14.3 s (ref 11.6–15.2)

## 2015-07-17 NOTE — ED Notes (Signed)
Pt reports that she woke up about 2am and had dark red stools and abd pain. States that she is on Plavix and Eliquis.

## 2015-07-17 NOTE — ED Notes (Signed)
Unable to locate when called for re-assessment

## 2015-07-17 NOTE — Telephone Encounter (Signed)
ok 

## 2015-07-17 NOTE — Telephone Encounter (Signed)
Patient reports 11 to 12 urgent foul smelling black unformed stools since about 2 am. She has a distinct discomfort across her abdomen. Patient is on anticoagulation therapy. Her blood sugar was "400" but she had forgotten her medication. She is not nauseated but feels very hungry and empty. Discussed this with Dr Silverio Decamp. Patient is advised to go to the ER at [facility] long for evaluation by the ER provider.

## 2015-07-30 ENCOUNTER — Ambulatory Visit (INDEPENDENT_AMBULATORY_CARE_PROVIDER_SITE_OTHER): Payer: Commercial Managed Care - HMO | Admitting: Endocrinology

## 2015-07-30 ENCOUNTER — Encounter: Payer: Self-pay | Admitting: Endocrinology

## 2015-07-30 VITALS — BP 134/74 | HR 85 | Temp 98.2°F | Ht 64.0 in | Wt 177.0 lb

## 2015-07-30 DIAGNOSIS — E0590 Thyrotoxicosis, unspecified without thyrotoxic crisis or storm: Secondary | ICD-10-CM

## 2015-07-30 NOTE — Progress Notes (Signed)
Subjective:    Patient ID: Judith [name], female    DOB: December 29, 1950, 65 y.o.   MRN: [id number]  HPI Pt says she took synthroid for a brief time in the 1990's, in an attempt to shrink a goiter.  Slightly suppressed TSH was first noted in late 2016, when she was in the hospital for new-onset AF.  She converted back to SR.  He has never been on therapy for this.  she has never had XRT to the anterior neck, or thyroid surgery (although she did have C-spine procedure).  she has not had dedicated thyroid imaging since the 1990's.  she does not consume kelp or any other prescribed or non-prescribed thyroid medication.  she has never been on amiodarone.  She has slight tremor of the hands, and assoc excessive diaphoresis.   Past Medical History  Diagnosis Date  . Hypertension   . Goiter   . Chronic kidney disease   . History of shingles 06/01/2013  . Abnormal EKG 07/31/2013  . GERD (gastroesophageal reflux disease)   . Carpal tunnel syndrome, bilateral   . Coronary artery disease   . Hyperlipidemia   . Anginal pain ([place])   . Asthma   . Pneumonia ~ 1976  . Type II diabetes mellitus (HCC)     insulin dependent  . Arthritis     "hands" (03/06/2015)  . Diabetic peripheral neuropathy ([place]) "since 1996"  . Atrial fibrillation with RVR ([place]) 04/29/2015    Past Surgical History  Procedure Laterality Date  . Tonsillectomy  1976  . Cesarean section  1982; 1984  . Carpal tunnel release Right Nov 2015  . Knee arthroscopy Right ~ 2003    "meniscus repair"  . Shoulder open rotator cuff repair Right 1996; 1998    "w/fracture repair"  . Foot neuroma surgery Bilateral 2000  . Carpal tunnel release Right 1992; 05/2014    [name]; [place]  . Thyroid surgery  2000    "removed lots of nodules"  . Cardiac catheterization N/A 02/27/2015    Procedure: Left Heart Cath and Coronary Angiography;  Surgeon: [name], MD; LAD 40%, mCFX 80%, OM 70%, RCA 100% calcified       . Cardiac catheterization N/A  03/06/2015    Procedure: Coronary Stent Intervention;  Surgeon: [name], MD;  Location: [facility];  Service: Cardiovascular;  Laterality: N/A;  Mid CX 3.50x12 promus DES w/ 0% resdual and Prox OM1 2.50x20 promus DES w/ 20% residual  . Abdominal hysterectomy  1988    age 75; CERVICAL DYSPLASIA; ovaries intact.     Social History   Social History  . Marital Status: Divorced    Spouse Name: N/A  . Number of Children: N/A  . Years of Education: N/A   Occupational History  . Food Therapist, nutritional    Social History Main Topics  . Smoking status: Former Smoker -- 0.50 packs/day for 41 years    Types: Cigarettes  . Smokeless tobacco: Never Used     Comment: 04/29/2015 "quit smoking cigarettes 02/27/2015"  . Alcohol Use: No  . Drug Use: No  . Sexual Activity: Not Currently    Birth Control/ Protection: Post-menopausal, Surgical   Other Topics Concern  . 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      Lives with sister in [place].      Tobacco: 1 ppd x 41 years      Alcohol: none      Drugs: none      Exercise:  Walking in neighborhood; physical job.       Current Outpatient Prescriptions on File Prior to Visit  Medication Sig Dispense Refill  . albuterol (PROVENTIL HFA) 108 (90 BASE) MCG/ACT inhaler Inhale into the lungs every 6 (six) hours as needed for wheezing or shortness of breath.    Marland Kitchen apixaban (ELIQUIS) 5 MG TABS tablet Take 1 tablet (5 mg total) by mouth 2 (two) times daily. 60 tablet 11  . atorvastatin (LIPITOR) 20 MG tablet Take 1 tablet (20 mg total) by mouth daily. 90 tablet 1  . buPROPion (WELLBUTRIN SR) 150 MG 12 hr tablet TAKE ONE TABLET BY MOUTH TWICE DAILY 180 tablet 0  . clopidogrel (PLAVIX) 75 MG tablet Take 1 tablet  (75 mg total) by mouth daily. 30 tablet 11  . diltiazem (CARDIZEM CD) 240 MG 24 hr capsule Take 1 capsule (240 mg total) by mouth daily. 30 capsule 11  . FLUoxetine (PROZAC) 20 MG capsule TAKE ONE CAPSULE BY MOUTH ONCE DAILY 90 capsule 0  . gabapentin (NEURONTIN) 300 MG capsule Take 300 mg by mouth 3 (three) times daily. Patient take 3 capsules by mouth at lunchtime and three capsules by mouth at bedtime.    Marland Kitchen glipiZIDE (GLUCOTROL XL) 5 MG 24 hr tablet Take 1 tablet (5 mg total) by mouth daily with lunch. 90 tablet 1  . Insulin Detemir (LEVEMIR) 100 UNIT/ML Pen Inject 36 Units into the skin daily at 10 pm. 15 mL 11  . insulin lispro (HUMALOG) 100 UNIT/ML injection Inject 0.05-0.1 mLs (5-10 Units total) into the skin 3 (three) times daily before meals. 10 mL 2  . Insulin Pen Needle 32G X 6 MM MISC Use as directed 50 each 5  . losartan (COZAAR) 50 MG tablet Take 1 tablet (50 mg total) by mouth daily. 30 tablet 11  . metFORMIN (GLUCOPHAGE) 1000 MG tablet TAKE ONE TABLET BY MOUTH TWICE DAILY WITH MEALS 180 tablet 0  . metoprolol succinate (TOPROL-XL) 50 MG 24 hr tablet Take 1 tablet (50 mg total) by mouth daily. Take with or immediately following a meal. 30 tablet 11  . nitroGLYCERIN (NITROSTAT) 0.4 MG SL tablet Place 1 tablet (0.4 mg total) under the tongue every 5 (five) minutes as needed for chest pain. 25 tablet 3  . ondansetron (ZOFRAN) 4 MG tablet Take 1 tablet (4 mg total) by mouth every 8 (eight) hours as needed for nausea or vomiting. 20 tablet 0  . pantoprazole (PROTONIX) 40 MG tablet Take 1 tablet (40 mg total) by mouth daily. 30 tablet 11  . traMADol (ULTRAM) 50 MG tablet Take 1-2 tablets qhs PRN neuropathy 60 tablet 5  . traMADol (ULTRAM) 50 MG tablet Take 50 mg by mouth 3 (three) times daily.    Marland Kitchen triamcinolone cream (KENALOG) 0.1 % Apply 1 application topically 2 (two) times daily. May mix 1:1 with eurerin cream at home 454 g 0   No current facility-administered medications on file prior  to visit.    Allergies  Allergen Reactions  . Novolog [Insulin Aspart] Shortness Of Breath and Other (See Comments)    "breathing problems"  . Codeine Nausea And Vomiting and Other (See Comments)    HIGH DOSES-SEVERE VOMITING  . Iodine Other (See Comments)  MUST HAVE BENADRYL PRIOR TO PROCEDURE AND RIGHT BEFORE TREATMENT TO COUNTERACT REACTION-BLISTERING REACTION DERMATOLOGICAL  . Ace Inhibitors Cough  . Neosporin [Neomycin-Bacitracin Zn-Polymyx] Itching and Rash    MAKES REACTIONS WORSE WHEN USING AS PROPHYLACTIC  . Penicillins Itching, Rash and Other (See Comments)    CHEST SIZED RASH AND ITCHING   . Tape Itching and Rash    Family History  Problem Relation Age of Onset  . Cancer Mother 47    bronchial cancer  . Hypertension Father   . COPD Father   . Heart disease Father 24    CAD with cardiac stenting  . Heart attack Father   . Allergies Sister   . Breast cancer Maternal Grandmother   . Emphysema Maternal Grandfather   . Leukemia Paternal Grandmother   . Emphysema Paternal Grandfather   . Thyroid disease Neg Hx     BP 134/74 mmHg  Pulse 85  Temp(Src) 98.2 F (36.8 C) (Oral)  Ht 5\' 4"  (1.626 m)  Wt 177 lb (80.287 kg)  BMI 30.37 kg/m2  SpO2 95%  Review of Systems denies headache, hoarseness, visual loss, palpitations, sob, diarrhea, polyuria, muscle weakness, dry skin, seizure, anxiety, heat intolerance, and rhinorrhea.  She has hair loss, easy bruising, and weight gain.       Objective:   Physical Exam VS: see vs page GEN: no distress HEAD: head: no deformity eyes: no periorbital swelling, no proptosis external nose and ears are normal mouth: no lesion seen NECK: a healed scar is present.  2 right thyroid nodules are easily palpable CHEST WALL: no deformity LUNGS:  Clear to auscultation CV: reg rate and rhythm, no murmur ABD: abdomen is soft, nontender.  no hepatosplenomegaly.  not distended.  no hernia MUSCULOSKELETAL: muscle bulk and strength are  grossly normal.  no obvious joint swelling.  gait is normal and steady EXTEMITIES: no deformity.  Trace bilat leg edema PULSES: no carotid bruit NEURO:  cn 2-12 grossly intact.   readily moves all 4's.  sensation is intact to touch on all 4's.   No tremor. SKIN:  Normal texture and temperature.  No rash or suspicious lesion is visible.   NODES:  None palpable at the neck PSYCH: alert, well-oriented.  Does not appear anxious nor depressed.  I have reviewed outside records, and summarized: Pt was noted to have suppressed TSH, and referred here.   Lab Results  Component Value Date   TSH 0.291* 05/30/2015   T4TOTAL 7.8 04/30/2015   CT C-spine: thyromegaly with some loss of soft tissue planes about the thyroid at the thoracic inlet     Assessment & Plan:  Multinodular goiter, new.   Hyperthyroidism, prob due to the goiter.    Patient is advised the following: Patient Instructions  let's check a thyroid ultrasound, then a nuclear medicine "scan" (a special, but easy and painless type of thyroid x ray), which works like this: you go to the x-ray department of the hospital to swallow a pill, which contains a miniscule amount of radiation.  You will not notice any symptoms from this.  You will go back to the x-ray department the next day, to lie down in front of a camera.  The results of this will be sent to me.   Based on the results, i hope to order for you a treatment pill of radioactive iodine.  Although it is a larger amount of radiation, you will again notice no symptoms from this.  The pill is gone from your  body in a few days (during which you should stay away from other people), but takes several months to work.  Therefore, please return here approximately 6-8 weeks after the treatment.  This treatment has been available for many years, and the only known side-effect is an underactive thyroid.  It is possible that i would eventually prescribe for you a thyroid hormone pill, which is very  inexpensive.  You don't have to worry about side-effects of this thyroid hormone pill, because it is the same molecule your thyroid makes.

## 2015-07-30 NOTE — Patient Instructions (Signed)
let's check a thyroid ultrasound, then a nuclear medicine "scan" (a special, but easy and painless type of thyroid x ray), which works like this: you go to the x-ray department of the hospital to swallow a pill, which contains a miniscule amount of radiation.  You will not notice any symptoms from this.  You will go back to the x-ray department the next day, to lie down in front of a camera.  The results of this will be sent to me.   Based on the results, i hope to order for you a treatment pill of radioactive iodine.  Although it is a larger amount of radiation, you will again notice no symptoms from this.  The pill is gone from your body in a few days (during which you should stay away from other people), but takes several months to work.  Therefore, please return here approximately 6-8 weeks after the treatment.  This treatment has been available for many years, and the only known side-effect is an underactive thyroid.  It is possible that i would eventually prescribe for you a thyroid hormone pill, which is very inexpensive.  You don't have to worry about side-effects of this thyroid hormone pill, because it is the same molecule your thyroid makes.

## 2015-07-31 ENCOUNTER — Other Ambulatory Visit: Payer: Self-pay

## 2015-07-31 MED ORDER — CYCLOBENZAPRINE HCL 5 MG PO TABS: 5.0000 mg | ORAL_TABLET | Freq: Three times a day (TID) | ORAL | Status: DC | PRN

## 2015-08-05 ENCOUNTER — Other Ambulatory Visit: Payer: Commercial Managed Care - HMO

## 2015-08-07 ENCOUNTER — Ambulatory Visit
Admission: RE | Admit: 2015-08-07 | Discharge: 2015-08-07 | Disposition: A | Discharge status: Home / Self Care | Payer: Commercial Managed Care - HMO | Source: Ambulatory Visit | Attending: Endocrinology | Admitting: Endocrinology

## 2015-08-07 DIAGNOSIS — E0590 Thyrotoxicosis, unspecified without thyrotoxic crisis or storm: Secondary | ICD-10-CM

## 2015-08-08 NOTE — Progress Notes (Signed)
This encounter was created in error - please disregard.

## 2015-08-11 ENCOUNTER — Encounter: Payer: Self-pay | Admitting: Podiatry

## 2015-08-11 ENCOUNTER — Ambulatory Visit (INDEPENDENT_AMBULATORY_CARE_PROVIDER_SITE_OTHER): Payer: Commercial Managed Care - HMO | Admitting: Podiatry

## 2015-08-11 DIAGNOSIS — L84 Corns and callosities: Secondary | ICD-10-CM | POA: Diagnosis not present

## 2015-08-11 DIAGNOSIS — M79676 Pain in unspecified toe(s): Secondary | ICD-10-CM

## 2015-08-11 DIAGNOSIS — B351 Tinea unguium: Secondary | ICD-10-CM | POA: Diagnosis not present

## 2015-08-11 DIAGNOSIS — E1149 Type 2 diabetes mellitus with other diabetic neurological complication: Secondary | ICD-10-CM | POA: Diagnosis not present

## 2015-08-11 NOTE — Progress Notes (Signed)
   Subjective:    Patient ID: Judith [name], female    DOB: [birth year], 65 y.o.   MRN: [id number]  HPI  65 year old female presents the office if returns the spot on the right big toe. She states that she recently had some blood underneath the callus that she's been trimming herself. She currently denies any open sores. She also states that her nails are thick, discolored, elongated she cannot trim them herself. Her sister to Dupuytren's the toenails. Denies any drainage or pus any redness or swelling. She states that her blood sugar last Blood sugar was 239. Last A1c was 9.5. Denies any claudication symptoms. She does state that she has neuropathy. No other complaints at this time.  Review of Systems  All other systems reviewed and are negative.      Objective:   Physical Exam General: AAO x3, NAD  Dermatological:   hyperkeratotic lesion present the distal aspect of the right hallux. Upon debridement there is no underlying ulceration, drainage or other signs of infection. There was some dried blood underneath the callus how there is no ulcer identified at this time. Hyperkeratotic lesion also the distal aspect of the right second toe. No underlying ulceration, drainage or other signs of infection. There are no other open lesions or pre-ulcer lesions identified at this time.Nails appear to be hypertrophic, dystrophic, brittle, discolored, elongated 10. No swelling erythema or drainage. Subjective tenderness to nails 1-5 bilaterally.  Vascular: Dorsalis Pedis artery and Posterior Tibial artery pedal pulses are 2/4 bilateral with immedate capillary fill time. Pedal hair growth present. No varicosities and no lower extremity edema present bilateral. There is no pain with calf compression, swelling, warmth, erythema.   Neruologic:  Sensation decreased with Derrel Nip monofilament. Vibratory sensation decreased.  Musculoskeletal:  Hammertoes are present. No gross boney pedal deformities  bilateral. No pain, crepitus, or limitation noted with foot and ankle range of motion bilateral. Muscular strength 5/5 in all groups tested bilateral.  Gait: Unassisted, Nonantalgic.      Assessment & Plan:  65 year old female with pre-ulcerative calluses right foot, neuropathy, symptomatic onychomycosis -Treatment options discussed including all alternatives, risks, and complications -Etiology of symptoms were discussed -Pre-ulcerative callus was debrided 2 without complications or bleeding. -Nails are brittle 10 without complications or bleeding. -Given diabetes with neuropathy as was pre-ulcerative calluses and digital deformity recommend diabetic shoes. Paperwork was completed today for precertification. -Follow-up in 3 months or sooner if any problems arise. In the meantime, encouraged to call the office with any questions, concerns, change in symptoms.   Celesta Gentile, DPM

## 2015-08-12 ENCOUNTER — Ambulatory Visit (INDEPENDENT_AMBULATORY_CARE_PROVIDER_SITE_OTHER): Payer: Commercial Managed Care - HMO | Admitting: Neurology

## 2015-08-12 ENCOUNTER — Encounter: Payer: Self-pay | Admitting: Neurology

## 2015-08-12 VITALS — BP 130/81 | HR 88 | Ht 64.0 in | Wt 177.0 lb

## 2015-08-12 DIAGNOSIS — E1142 Type 2 diabetes mellitus with diabetic polyneuropathy: Secondary | ICD-10-CM | POA: Diagnosis not present

## 2015-08-12 DIAGNOSIS — E559 Vitamin D deficiency, unspecified: Secondary | ICD-10-CM | POA: Diagnosis not present

## 2015-08-12 DIAGNOSIS — I4891 Unspecified atrial fibrillation: Secondary | ICD-10-CM | POA: Diagnosis not present

## 2015-08-12 DIAGNOSIS — I2510 Atherosclerotic heart disease of native coronary artery without angina pectoris: Secondary | ICD-10-CM

## 2015-08-12 DIAGNOSIS — R413 Other amnesia: Secondary | ICD-10-CM | POA: Diagnosis not present

## 2015-08-12 NOTE — Progress Notes (Signed)
PATIENT: Judith [name] DOB: [birth year]  Chief Complaint  Patient presents with  . Tremors    She is here with her sister, Judith [name].  She has been having bilateral hand tremors for the last five years.  She is having more difficulty when writing.  She sometimes has problems with choking.  She has been falling frequently. She is concerned due to her father having Parkinson's Disease.     HISTORICAL  Judith [name] is a 65 years old right-handed female, seen in refer by her primary care physician Dr. [name] he August 12 2015 for evaluation of bilateral hands tremor, falling episode, and memory loss, she is accompanied by her sister Judith [name]   She had a history of hypertension, hyperlipidemia, diabetes for more than 20 years, atrial fibrillation, on chronic Eliquis treatment, coronary artery disease, status post stent, chronic kidney disease, she is a retired Education officer, museum, now works for [employer], including heavy lifting  She was noted to have memory loss over the past few years, she needs to be reminded about appointment, had word finding difficulties, she still drives without getting loss, she lives with her sister.  Her father has Parkinson's disease, she noted bilateral hands shaking since 2012, getting worse holding small subject, writing, her sister also suffered similar bilateral hands shaking.  She had long-standing history of diabetic peripheral neuropathy, complains of bilateral feet numbness tingling, taking gabapentin and the muscle relaxant.  She had a few fall accident, she also felt lightheaded dizzy when she first got up from overnight sleep, occasionally falling down when she get up quickly after prolonged sitting, she denies significant gait difficulty, no bowel and bladder incontinence, over the years, she had worsening progressing ascending paresthesia, now has fingertips numbness tingling, mild bilateral hands weakness, mild difficulty opening bottles.    Laboratory evaluation showed mild low TSH, she is going through treatment for hyperthyroidism, low normal B12 290, vitamin D was mildly low 28, normal CMP, with exception of elevated glucose, 311, CBC, A1c was elevated 8.2  I have personally reviewed CAT scan in January 2017, CAT scan of the brain, mild generalized atrophy, periventricular small vessel disease, calcification at left thalamus   CAT scan of cervical spine, multilevel mild degenerative changes.  REVIEW OF SYSTEMS: Full 14 system review of systems performed and notable only for Memory loss, confusion, numbness, increased thirst, achy muscles, allergy, runny nose, blood in stool, constipation, rash, weight gain, easy bruising, easy bleeding ALLERGIES: Allergies  Allergen Reactions  . Novolog [Insulin Aspart] Shortness Of Breath and Other (See Comments)    "breathing problems"  . Codeine Nausea And Vomiting and Other (See Comments)    HIGH DOSES-SEVERE VOMITING  . Iodine Other (See Comments)    MUST HAVE BENADRYL PRIOR TO PROCEDURE AND RIGHT BEFORE TREATMENT TO COUNTERACT REACTION-BLISTERING REACTION DERMATOLOGICAL  . Ace Inhibitors Cough  . Neosporin [Neomycin-Bacitracin Zn-Polymyx] Itching and Rash    MAKES REACTIONS WORSE WHEN USING AS PROPHYLACTIC  . Penicillins Itching, Rash and Other (See Comments)    CHEST SIZED RASH AND ITCHING   . Tape Itching and Rash    HOME MEDICATIONS: Current Outpatient Prescriptions  Medication Sig Dispense Refill  . albuterol (PROVENTIL HFA) 108 (90 BASE) MCG/ACT inhaler Inhale into the lungs every 6 (six) hours as needed for wheezing or shortness of breath.    Marland Kitchen apixaban (ELIQUIS) 5 MG TABS tablet Take 1 tablet (5 mg total) by mouth 2 (two) times daily. 60 tablet 11  .  atorvastatin (LIPITOR) 20 MG tablet Take 1 tablet (20 mg total) by mouth daily. 90 tablet 1  . buPROPion (WELLBUTRIN SR) 150 MG 12 hr tablet TAKE ONE TABLET BY MOUTH TWICE DAILY 180 tablet 0  . clindamycin (CLEOCIN) 300 MG  capsule TK 2 CS PO 1 HOUR PRIOR TO DENTAL APPOINTMENT  0  . clopidogrel (PLAVIX) 75 MG tablet Take 1 tablet (75 mg total) by mouth daily. 30 tablet 11  . cyclobenzaprine (FLEXERIL) 5 MG tablet Take 1 tablet (5 mg total) by mouth 3 (three) times daily as needed for muscle spasms. 30 tablet 2  . diltiazem (CARDIZEM CD) 240 MG 24 hr capsule Take 1 capsule (240 mg total) by mouth daily. 30 capsule 11  . FLUoxetine (PROZAC) 20 MG capsule TAKE ONE CAPSULE BY MOUTH ONCE DAILY 90 capsule 0  . gabapentin (NEURONTIN) 300 MG capsule Take 300 mg by mouth 3 (three) times daily. Patient take 3 capsules by mouth at lunchtime and three capsules by mouth at bedtime.    Marland Kitchen glipiZIDE (GLUCOTROL XL) 5 MG 24 hr tablet Take 1 tablet (5 mg total) by mouth daily with lunch. 90 tablet 1  . Insulin Detemir (LEVEMIR) 100 UNIT/ML Pen Inject 36 Units into the skin daily at 10 pm. 15 mL 11  . insulin lispro (HUMALOG) 100 UNIT/ML injection Inject 0.05-0.1 mLs (5-10 Units total) into the skin 3 (three) times daily before meals. 10 mL 2  . Insulin Pen Needle 32G X 6 MM MISC Use as directed 50 each 5  . losartan (COZAAR) 50 MG tablet Take 1 tablet (50 mg total) by mouth daily. 30 tablet 11  . metFORMIN (GLUCOPHAGE) 1000 MG tablet TAKE ONE TABLET BY MOUTH TWICE DAILY WITH MEALS 180 tablet 0  . metoprolol succinate (TOPROL-XL) 50 MG 24 hr tablet Take 1 tablet (50 mg total) by mouth daily. Take with or immediately following a meal. 30 tablet 11  . nitroGLYCERIN (NITROSTAT) 0.4 MG SL tablet Place 1 tablet (0.4 mg total) under the tongue every 5 (five) minutes as needed for chest pain. 25 tablet 3  . ondansetron (ZOFRAN) 4 MG tablet Take 1 tablet (4 mg total) by mouth every 8 (eight) hours as needed for nausea or vomiting. 20 tablet 0  . pantoprazole (PROTONIX) 40 MG tablet Take 1 tablet (40 mg total) by mouth daily. 30 tablet 11  . traMADol (ULTRAM) 50 MG tablet Take 1-2 tablets qhs PRN neuropathy 60 tablet 5  . traMADol (ULTRAM) 50 MG  tablet Take 50 mg by mouth 3 (three) times daily.    Marland Kitchen triamcinolone (KENALOG) 0.1 % paste     . triamcinolone cream (KENALOG) 0.1 % Apply 1 application topically 2 (two) times daily. May mix 1:1 with eurerin cream at home 454 g 0   No current facility-administered medications for this visit.    PAST MEDICAL HISTORY: Past Medical History  Diagnosis Date  . Hypertension   . Goiter   . Chronic kidney disease   . History of shingles 06/01/2013  . Abnormal EKG 07/31/2013  . GERD (gastroesophageal reflux disease)   . Carpal tunnel syndrome, bilateral   . Coronary artery disease   . Hyperlipidemia   . Anginal pain ([place])   . Asthma   . Pneumonia ~ 1976  . Type II diabetes mellitus ([facility])     insulin dependent  . Arthritis     "hands" (03/06/2015)  . Diabetic peripheral neuropathy ([place]) "since 1996"  . Atrial fibrillation with RVR ([place]) 04/29/2015  .  Carpal tunnel syndrome, bilateral   . Tremors of nervous system     PAST SURGICAL HISTORY: Past Surgical History  Procedure Laterality Date  . Tonsillectomy  1976  . Cesarean section  1982; 1984  . Carpal tunnel release Right Nov 2015  . Knee arthroscopy Right ~ 2003    "meniscus repair"  . Shoulder open rotator cuff repair Right 1996; 1998    "w/fracture repair"  . Foot neuroma surgery Bilateral 2000  . Carpal tunnel release Right 1992; 05/2014    [name]; [place]  . Thyroid surgery  2000    "removed lots of nodules"  . Cardiac catheterization N/A 02/27/2015    Procedure: Left Heart Cath and Coronary Angiography;  Surgeon: [name], MD; LAD 40%, mCFX 80%, OM 70%, RCA 100% calcified       . Cardiac catheterization N/A 03/06/2015    Procedure: Coronary Stent Intervention;  Surgeon: [name], MD;  Location: [facility];  Service: Cardiovascular;  Laterality: N/A;  Mid CX 3.50x12 promus DES w/ 0% resdual and Prox OM1 2.50x20 promus DES w/ 20% residual  . Abdominal hysterectomy  1988    age 54; CERVICAL  DYSPLASIA; ovaries intact.     FAMILY HISTORY: Family History  Problem Relation Age of Onset  . Cancer Mother 8    bronchial cancer  . Hypertension Father   . COPD Father   . Heart disease Father 81    CAD with cardiac stenting  . Heart attack Father   . Allergies Sister   . Breast cancer Maternal Grandmother   . Emphysema Maternal Grandfather   . Leukemia Paternal Grandmother   . Emphysema Paternal Grandfather   . Thyroid disease Neg Hx   . Breast cancer Mother   . Lung cancer Mother   . Parkinson's disease Father     SOCIAL HISTORY:  Social History   Social History  . Marital Status: Divorced    Spouse Name: N/A  . Number of Children: 2  . Years of Education: Masters   Occupational History  . Food Therapist, nutritional    Social History Main Topics  . Smoking status: Former Smoker -- 0.50 packs/day for 41 years    Types: Cigarettes  . Smokeless tobacco: Never Used     Comment: 04/29/2015 "quit smoking cigarettes 02/27/2015"  . Alcohol Use: No  . Drug Use: No  . Sexual Activity: Not Currently    Birth Control/ Protection: Post-menopausal, Surgical   Other Topics Concern  . 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      Lives with sister in [place].      Tobacco: 1 ppd x 41 years - quit 2016      Alcohol: none      Drugs: none      Exercise:  Walking in neighborhood; physical job.   Right-handed.   2 cups caffeine daily.        PHYSICAL EXAM   Filed Vitals:   08/12/15 1614  BP: 130/81  Pulse: 88  Height: 5\' 4"  (1.626 m)  Weight: 177 lb (80.287 kg)    Not recorded      Body mass index is 30.37 kg/(m^2).  PHYSICAL EXAMNIATION:  Gen: NAD, conversant, well nourised, obese, well  groomed                       Cardiovascular: Regular rate rhythm, no peripheral edema, warm, nontender. Eyes: Conjunctivae clear without exudates or hemorrhage Neck: Supple, no carotid bruise. Pulmonary: Clear to auscultation bilaterally   NEUROLOGICAL EXAM:  MENTAL STATUS: Speech:    Speech is normal; fluent and spontaneous with normal comprehension.  Cognition:Mini-Mental Status Examination 27/30     Orientation to time, place and person     Recent and remote memory: She missed one out of 3 recall     Attention span and concentration: She has mild difficulty spelling WORLD backward     Normal Language, naming, repeating,spontaneous speech     Fund of knowledge   CRANIAL NERVES: CN II: Visual fields are full to confrontation. Fundoscopic exam is normal with sharp discs and no vascular changes. Pupils are round equal and briskly reactive to light. CN III, IV, VI: extraocular movement are normal. No ptosis. CN V: Facial sensation is intact to pinprick in all 3 divisions bilaterally. Corneal responses are intact.  CN VII: Face is symmetric with normal eye closure and smile. CN VIII: Hearing is normal to rubbing fingers CN IX, X: Palate elevates symmetrically. Phonation is normal. CN XI: Head turning and shoulder shrug are intact CN XII: Tongue is midline with normal movements and no atrophy.  MOTOR: There is no pronator drift of out-stretched arms. Muscle bulk and tone are normal. Muscle strength is normal.  REFLEXES: Reflexes are 2+ and symmetric at the biceps, triceps, knees, and absent at  ankles. Plantar responses are flexor.  SENSORY: Intact to light touch, pinprick, positional sensation and vibratory sensation are intact in fingers and toes.  COORDINATION: Rapid alternating movements and fine finger movements are intact. There is no dysmetria on finger-to-nose and heel-knee-shin.    GAIT/STANCE: She needs to push up to get up from seated position, mildly unsteady, cautious gait, has difficulty with  tandem walking   DIAGNOSTIC DATA (LABS, IMAGING, TESTING) - I reviewed patient records, labs, notes, testing and imaging myself where available.   ASSESSMENT AND PLAN  Judith [name] is a 65 y.o. female   Mild cognitive impairment  Mini-Mental Status Examination 27/30  Differentiation diagnosis includes central nervous system degenerative disorder, likely a vascular component  Proceed with MRI of the brain without contrast  No treatable cause find by laboratory evaluation  Diabetic peripheral neuropathy  Orthostatic blood pressure changes will explain her complaints of dizziness with sudden positional change, frequent falling  EMG nerve conduction study for baseline  I also educated her about counter maneuver to avoid orthostatic blood pressure changes     Marcial Pacas, M.D. Ph.D.  [facility] [address] [place], [place] [address] Ph: [phone] Fax: [phone]  CC: [name], MD

## 2015-08-14 ENCOUNTER — Encounter: Payer: Self-pay | Admitting: Endocrinology

## 2015-08-14 ENCOUNTER — Ambulatory Visit (INDEPENDENT_AMBULATORY_CARE_PROVIDER_SITE_OTHER): Payer: Commercial Managed Care - HMO | Admitting: Endocrinology

## 2015-08-14 VITALS — BP 136/84 | HR 91 | Temp 98.1°F | Ht 64.0 in | Wt 177.0 lb

## 2015-08-14 DIAGNOSIS — E0590 Thyrotoxicosis, unspecified without thyrotoxic crisis or storm: Secondary | ICD-10-CM

## 2015-08-14 NOTE — Patient Instructions (Addendum)
let's check a nuclear medicine "scan" (a special, but easy and painless type of thyroid x ray), which works like this: you go to the x-ray department of the hospital to swallow a pill, which contains a miniscule amount of radiation.  You will not notice any symptoms from this.  You will go back to the x-ray department the next day, to lie down in front of a camera.  The results of this will be sent to me.   Based on the results, i hope to order for you a treatment pill of radioactive iodine.  Although it is a larger amount of radiation, you will again notice no symptoms from this.  The pill is gone from your body in a few days (during which you should stay away from other people), but takes several months to work.  Therefore, please return here approximately 6-8 weeks after the treatment.  This treatment has been available for many years, and the only known side-effect is an underactive thyroid.  It is possible that i would eventually prescribe for you a thyroid hormone pill, which is very inexpensive.  You don't have to worry about side-effects of this thyroid hormone pill, because it is the same molecule your thyroid makes.

## 2015-08-14 NOTE — Addendum Note (Signed)
Addended by: Marcial Pacas on: 08/14/2015 08:45 AM   Modules accepted: Level of Service

## 2015-08-14 NOTE — Progress Notes (Signed)
Subjective:    Patient ID: Judith [name], female    DOB: 1950/06/04, 65 y.o.   MRN: [id number]  HPI Pt returns for f/u of hyperthyroidism (pt says she took synthroid for a brief time in the 1990's, in an attempt to shrink a goiter; slightly suppressed TSH was first noted in late 2016, when she was in the hospital for new-onset AF; she converted back to SR; se has never been on therapy for this; neck scar is from C-spine procedure; she has not had dedicated thyroid imaging since the 1990's).  She has an upcoming appt for a nuc med scan.  pt states she feels well in general. Past Medical History  Diagnosis Date  . Hypertension   . Goiter   . Chronic kidney disease   . History of shingles 06/01/2013  . Abnormal EKG 07/31/2013  . GERD (gastroesophageal reflux disease)   . Carpal tunnel syndrome, bilateral   . Coronary artery disease   . Hyperlipidemia   . Anginal pain ([place])   . Asthma   . Pneumonia ~ 1976  . Type II diabetes mellitus (HCC)     insulin dependent  . Arthritis     "hands" (03/06/2015)  . Diabetic peripheral neuropathy ([place]) "since 1996"  . Atrial fibrillation with RVR ([place]) 04/29/2015  . Carpal tunnel syndrome, bilateral   . Tremors of nervous system     Past Surgical History  Procedure Laterality Date  . Tonsillectomy  1976  . Cesarean section  1982; 1984  . Carpal tunnel release Right Nov 2015  . Knee arthroscopy Right ~ 2003    "meniscus repair"  . Shoulder open rotator cuff repair Right 1996; 1998    "w/fracture repair"  . Foot neuroma surgery Bilateral 2000  . Carpal tunnel release Right 1992; 05/2014    [name]; [place]  . Thyroid surgery  2000    "removed lots of nodules"  . Cardiac catheterization N/A 02/27/2015    Procedure: Left Heart Cath and Coronary Angiography;  Surgeon: [name], MD; LAD 40%, mCFX 80%, OM 70%, RCA 100% calcified       . Cardiac catheterization N/A 03/06/2015    Procedure: Coronary Stent Intervention;  Surgeon: [name], MD;  Location: [facility];  Service: Cardiovascular;  Laterality: N/A;  Mid CX 3.50x12 promus DES w/ 0% resdual and Prox OM1 2.50x20 promus DES w/ 20% residual  . Abdominal hysterectomy  1988    age 83; CERVICAL DYSPLASIA; ovaries intact.     Social History   Social History  . Marital Status: Divorced    Spouse Name: N/A  . Number of Children: 2  . Years of Education: Masters   Occupational History  . Food Therapist, nutritional    Social History Main Topics  . Smoking status: Former Smoker -- 0.50 packs/day for 41 years    Types: Cigarettes  . Smokeless tobacco: Never Used     Comment: 04/29/2015 "quit smoking cigarettes 02/27/2015"  . Alcohol Use: No  . Drug Use: No  . Sexual Activity: Not Currently    Birth Control/ Protection: Post-menopausal, Surgical   Other Topics Concern  . 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      Lives with sister in [place].  Tobacco: 1 ppd x 41 years - quit 2016      Alcohol: none      Drugs: none      Exercise:  Walking in neighborhood; physical job.   Right-handed.   2 cups caffeine daily.       Current Outpatient Prescriptions on File Prior to Visit  Medication Sig Dispense Refill  . albuterol (PROVENTIL HFA) 108 (90 BASE) MCG/ACT inhaler Inhale into the lungs every 6 (six) hours as needed for wheezing or shortness of breath.    Marland Kitchen apixaban (ELIQUIS) 5 MG TABS tablet Take 1 tablet (5 mg total) by mouth 2 (two) times daily. 60 tablet 11  . atorvastatin (LIPITOR) 20 MG tablet Take 1 tablet (20 mg total) by mouth daily. 90 tablet 1  . buPROPion (WELLBUTRIN SR) 150 MG 12 hr tablet TAKE ONE TABLET BY MOUTH TWICE DAILY 180 tablet 0  . clindamycin (CLEOCIN) 300 MG capsule TK 2 CS PO 1 HOUR PRIOR TO  DENTAL APPOINTMENT  0  . clopidogrel (PLAVIX) 75 MG tablet Take 1 tablet (75 mg total) by mouth daily. 30 tablet 11  . cyclobenzaprine (FLEXERIL) 5 MG tablet Take 1 tablet (5 mg total) by mouth 3 (three) times daily as needed for muscle spasms. 30 tablet 2  . diltiazem (CARDIZEM CD) 240 MG 24 hr capsule Take 1 capsule (240 mg total) by mouth daily. 30 capsule 11  . FLUoxetine (PROZAC) 20 MG capsule TAKE ONE CAPSULE BY MOUTH ONCE DAILY 90 capsule 0  . gabapentin (NEURONTIN) 300 MG capsule Take 300 mg by mouth 3 (three) times daily. Patient take 3 capsules by mouth at lunchtime and three capsules by mouth at bedtime.    Marland Kitchen glipiZIDE (GLUCOTROL XL) 5 MG 24 hr tablet Take 1 tablet (5 mg total) by mouth daily with lunch. 90 tablet 1  . Insulin Detemir (LEVEMIR) 100 UNIT/ML Pen Inject 36 Units into the skin daily at 10 pm. 15 mL 11  . insulin lispro (HUMALOG) 100 UNIT/ML injection Inject 0.05-0.1 mLs (5-10 Units total) into the skin 3 (three) times daily before meals. 10 mL 2  . Insulin Pen Needle 32G X 6 MM MISC Use as directed 50 each 5  . losartan (COZAAR) 50 MG tablet Take 1 tablet (50 mg total) by mouth daily. 30 tablet 11  . metFORMIN (GLUCOPHAGE) 1000 MG tablet TAKE ONE TABLET BY MOUTH TWICE DAILY WITH MEALS 180 tablet 0  . metoprolol succinate (TOPROL-XL) 50 MG 24 hr tablet Take 1 tablet (50 mg total) by mouth daily. Take with or immediately following a meal. 30 tablet 11  . nitroGLYCERIN (NITROSTAT) 0.4 MG SL tablet Place 1 tablet (0.4 mg total) under the tongue every 5 (five) minutes as needed for chest pain. 25 tablet 3  . ondansetron (ZOFRAN) 4 MG tablet Take 1 tablet (4 mg total) by mouth every 8 (eight) hours as needed for nausea or vomiting. 20 tablet 0  . pantoprazole (PROTONIX) 40 MG tablet Take 1 tablet (40 mg total) by mouth daily. 30 tablet 11  . traMADol (ULTRAM) 50 MG tablet Take 1-2 tablets qhs PRN neuropathy 60 tablet 5  . triamcinolone (KENALOG) 0.1 % paste     . triamcinolone  cream (KENALOG) 0.1 % Apply 1 application topically 2 (two) times daily. May mix 1:1 with eurerin cream at home 454 g 0   No current facility-administered medications on file prior to visit.    Allergies  Allergen Reactions  . Novolog [Insulin Aspart] Shortness Of Breath  and Other (See Comments)    "breathing problems"  . Codeine Nausea And Vomiting and Other (See Comments)    HIGH DOSES-SEVERE VOMITING  . Iodine Other (See Comments)    MUST HAVE BENADRYL PRIOR TO PROCEDURE AND RIGHT BEFORE TREATMENT TO COUNTERACT REACTION-BLISTERING REACTION DERMATOLOGICAL  . Ace Inhibitors Cough  . Neosporin [Neomycin-Bacitracin Zn-Polymyx] Itching and Rash    MAKES REACTIONS WORSE WHEN USING AS PROPHYLACTIC  . Penicillins Itching, Rash and Other (See Comments)    CHEST SIZED RASH AND ITCHING   . Tape Itching and Rash    Family History  Problem Relation Age of Onset  . Cancer Mother 25    bronchial cancer  . Hypertension Father   . COPD Father   . Heart disease Father 30    CAD with cardiac stenting  . Heart attack Father   . Allergies Sister   . Breast cancer Maternal Grandmother   . Emphysema Maternal Grandfather   . Leukemia Paternal Grandmother   . Emphysema Paternal Grandfather   . Thyroid disease Neg Hx   . Breast cancer Mother   . Lung cancer Mother   . Parkinson's disease Father     BP 136/84 mmHg  Pulse 91  Temp(Src) 98.1 F (36.7 C) (Oral)  Ht 5\' 4"  (1.626 m)  Wt 177 lb (80.287 kg)  BMI 30.37 kg/m2  SpO2 97%  Review of Systems Denies palpitations    Objective:   Physical Exam VITAL SIGNS:  See vs page GENERAL: no distress NECK: a healed scar is present.  Several right thyroid nodules are again palpable    Lab Results  Component Value Date   TSH 0.291* 05/30/2015   T4TOTAL 7.8 04/30/2015      Assessment & Plan:  Hyperthyroidism: nodules are palpable, but none are seen on [place].  She agrees to I-131 rx  Patient is advised the following: Patient  Instructions  let's check a nuclear medicine "scan" (a special, but easy and painless type of thyroid x ray), which works like this: you go to the x-ray department of the hospital to swallow a pill, which contains a miniscule amount of radiation.  You will not notice any symptoms from this.  You will go back to the x-ray department the next day, to lie down in front of a camera.  The results of this will be sent to me.   Based on the results, i hope to order for you a treatment pill of radioactive iodine.  Although it is a larger amount of radiation, you will again notice no symptoms from this.  The pill is gone from your body in a few days (during which you should stay away from other people), but takes several months to work.  Therefore, please return here approximately 6-8 weeks after the treatment.  This treatment has been available for many years, and the only known side-effect is an underactive thyroid.  It is possible that i would eventually prescribe for you a thyroid hormone pill, which is very inexpensive.  You don't have to worry about side-effects of this thyroid hormone pill, because it is the same molecule your thyroid makes.

## 2015-08-15 ENCOUNTER — Encounter (HOSPITAL_COMMUNITY): Payer: Self-pay | Admitting: Emergency Medicine

## 2015-08-15 ENCOUNTER — Emergency Department (HOSPITAL_COMMUNITY): Payer: Commercial Managed Care - HMO

## 2015-08-15 ENCOUNTER — Emergency Department (HOSPITAL_COMMUNITY)
Admission: EM | Admit: 2015-08-15 | Discharge: 2015-08-15 | Disposition: A | Discharge status: Home / Self Care | Payer: Commercial Managed Care - HMO | Attending: Emergency Medicine | Admitting: Emergency Medicine

## 2015-08-15 DIAGNOSIS — Z8701 Personal history of pneumonia (recurrent): Secondary | ICD-10-CM | POA: Diagnosis not present

## 2015-08-15 DIAGNOSIS — R002 Palpitations: Secondary | ICD-10-CM | POA: Diagnosis not present

## 2015-08-15 DIAGNOSIS — E785 Hyperlipidemia, unspecified: Secondary | ICD-10-CM | POA: Insufficient documentation

## 2015-08-15 DIAGNOSIS — Z8669 Personal history of other diseases of the nervous system and sense organs: Secondary | ICD-10-CM | POA: Insufficient documentation

## 2015-08-15 DIAGNOSIS — Z7984 Long term (current) use of oral hypoglycemic drugs: Secondary | ICD-10-CM | POA: Insufficient documentation

## 2015-08-15 DIAGNOSIS — Z7901 Long term (current) use of anticoagulants: Secondary | ICD-10-CM | POA: Diagnosis not present

## 2015-08-15 DIAGNOSIS — K219 Gastro-esophageal reflux disease without esophagitis: Secondary | ICD-10-CM | POA: Diagnosis not present

## 2015-08-15 DIAGNOSIS — R42 Dizziness and giddiness: Secondary | ICD-10-CM | POA: Diagnosis present

## 2015-08-15 DIAGNOSIS — R079 Chest pain, unspecified: Secondary | ICD-10-CM | POA: Insufficient documentation

## 2015-08-15 DIAGNOSIS — I129 Hypertensive chronic kidney disease with stage 1 through stage 4 chronic kidney disease, or unspecified chronic kidney disease: Secondary | ICD-10-CM | POA: Insufficient documentation

## 2015-08-15 DIAGNOSIS — I2510 Atherosclerotic heart disease of native coronary artery without angina pectoris: Secondary | ICD-10-CM | POA: Diagnosis not present

## 2015-08-15 DIAGNOSIS — Z87891 Personal history of nicotine dependence: Secondary | ICD-10-CM | POA: Insufficient documentation

## 2015-08-15 DIAGNOSIS — Z7952 Long term (current) use of systemic steroids: Secondary | ICD-10-CM | POA: Insufficient documentation

## 2015-08-15 DIAGNOSIS — R000 Tachycardia, unspecified: Secondary | ICD-10-CM

## 2015-08-15 DIAGNOSIS — Z880 Allergy status to penicillin: Secondary | ICD-10-CM | POA: Diagnosis not present

## 2015-08-15 DIAGNOSIS — Z8619 Personal history of other infectious and parasitic diseases: Secondary | ICD-10-CM | POA: Insufficient documentation

## 2015-08-15 DIAGNOSIS — M1990 Unspecified osteoarthritis, unspecified site: Secondary | ICD-10-CM | POA: Diagnosis not present

## 2015-08-15 DIAGNOSIS — Z79899 Other long term (current) drug therapy: Secondary | ICD-10-CM | POA: Diagnosis not present

## 2015-08-15 DIAGNOSIS — N189 Chronic kidney disease, unspecified: Secondary | ICD-10-CM | POA: Insufficient documentation

## 2015-08-15 DIAGNOSIS — J45909 Unspecified asthma, uncomplicated: Secondary | ICD-10-CM | POA: Diagnosis not present

## 2015-08-15 DIAGNOSIS — E1142 Type 2 diabetes mellitus with diabetic polyneuropathy: Secondary | ICD-10-CM | POA: Diagnosis not present

## 2015-08-15 LAB — BASIC METABOLIC PANEL
Anion gap: 12 (ref 5–15)
BUN: 15 mg/dL (ref 6–20)
CHLORIDE: 98 mmol/L — AB (ref 101–111)
CO2: 26 mmol/L (ref 22–32)
CREATININE: 0.82 mg/dL (ref 0.44–1.00)
Calcium: 8.9 mg/dL (ref 8.9–10.3)
GFR calc non Af Amer: >60 mL/min (ref >60)
Glucose, Bld: 342 mg/dL — ABNORMAL HIGH (ref 65–99)
Potassium: 4 mmol/L (ref 3.5–5.1)
Sodium: 136 mmol/L (ref 135–145)

## 2015-08-15 LAB — CBC
HCT: 35 % — ABNORMAL LOW (ref 36.0–46.0)
Hemoglobin: 11.4 g/dL — ABNORMAL LOW (ref 12.0–15.0)
MCH: 27.9 pg (ref 26.0–34.0)
MCHC: 32.6 g/dL (ref 30.0–36.0)
MCV: 85.6 fL (ref 78.0–100.0)
Platelets: 305 10*3/uL (ref 150–400)
RBC: 4.09 MIL/uL (ref 3.87–5.11)
RDW: 13.8 % (ref 11.5–15.5)
WBC: 8.4 10*3/uL (ref 4.0–10.5)

## 2015-08-15 LAB — TROPONIN I: Troponin I: <0.03 ng/mL (ref <0.031)

## 2015-08-15 MED ORDER — SODIUM CHLORIDE 0.9 % IV BOLUS (SEPSIS)
500.0000 mL | Freq: Once | INTRAVENOUS | Status: AC
  Administered 2015-08-15: 500 mL via INTRAVENOUS

## 2015-08-15 NOTE — ED Notes (Signed)
Pt verbalizes understanding of instructions.NSR noted.

## 2015-08-15 NOTE — Discharge Instructions (Signed)
Nonspecific Tachycardia Tachycardia is a faster than normal heartbeat (more than 100 beats per minute). In adults, the heart normally beats between 60 and 100 times a minute. A fast heartbeat may be a normal response to exercise or stress. It does not necessarily mean that something is wrong. However, sometimes when your heart beats too fast it may not be able to pump enough blood to the rest of your body. This can result in chest pain, shortness of breath, dizziness, and even fainting. Nonspecific tachycardia means that the specific cause or pattern of your tachycardia is unknown. CAUSES  Tachycardia may be harmless or it may be due to a more serious underlying cause. Possible causes of tachycardia include:  Exercise or exertion.  Fever.  Pain or injury.  Infection.  Loss of body fluids (dehydration).  Overactive thyroid.  Lack of red blood cells (anemia).  Anxiety and stress.  Alcohol.  Caffeine.  Tobacco products.  Diet pills.  Illegal drugs.  Heart disease. SYMPTOMS  Rapid or irregular heartbeat (palpitations).  Suddenly feeling your heart beating (cardiac awareness).  Dizziness.  Tiredness (fatigue).  Shortness of breath.  Chest pain.  Nausea.  Fainting. DIAGNOSIS  Your caregiver will perform a physical exam and take your medical history. In some cases, a heart specialist (cardiologist) may be consulted. Your caregiver may also order:  Blood tests.  Electrocardiography. This test records the electrical activity of your heart.  A heart monitoring test. TREATMENT  Treatment will depend on the likely cause of your tachycardia. The goal is to treat the underlying cause of your tachycardia. Treatment methods may include:  Replacement of fluids or blood through an intravenous (IV) tube for moderate to severe dehydration or anemia.  New medicines or changes in your current medicines.  Diet and lifestyle changes.  Treatment for certain  infections.  Stress relief or relaxation methods. HOME CARE INSTRUCTIONS   Rest.  Drink enough fluids to keep your urine clear or pale yellow.  Do not smoke.  Avoid:  Caffeine.  Tobacco.  Alcohol.  Chocolate.  Stimulants such as over-the-counter diet pills or pills that help you stay awake.  Situations that cause anxiety or stress.  Illegal drugs such as marijuana, phencyclidine (PCP), and cocaine.  Only take medicine as directed by your caregiver.  Keep all follow-up appointments as directed by your caregiver. SEEK IMMEDIATE MEDICAL CARE IF:   You have pain in your chest, upper arms, jaw, or neck.  You become weak, dizzy, or feel faint.  You have palpitations that will not go away.  You vomit, have diarrhea, or pass blood in your stool.  Your skin is cool, pale, and wet.  You have a fever that will not go away with rest, fluids, and medicine. MAKE SURE YOU:   Understand these instructions.  Will watch your condition.  Will get help right away if you are not doing well or get worse.   This information is not intended to replace advice given to you by your health care provider. Make sure you discuss any questions you have with your health care provider.   Document Released: 06/03/2004 Document Revised: 07/19/2011 Document Reviewed: 11/08/2014 Elsevier Interactive Patient Education 2016 Elsevier Inc.  

## 2015-08-15 NOTE — ED Notes (Signed)
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 Pt also reports feeling lightheaded. Pt is supposed to have the test for a nuclear pill for her thyroid and it is suppose to help with the feeling of her heart beating hard. Pt denies any other complaints.

## 2015-08-15 NOTE — ED Provider Notes (Signed)
CSN: [id number]     Arrival date & time 08/15/15  [id number] History   First MD Initiated Contact with Patient 08/15/15 [phone]     Chief Complaint  Patient presents with  . Atrial Fibrillation  . Tachycardia     The history is provided by the patient.  Patient presents of fast heart rate and dizziness. 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 She states that she felt dizzy with it. States she still feels little dizzy does not feel heart going fast. States she feels lightheaded. States did have some slight tightness in her upper chest going to her neck. She's been seeing endocrinology for hyperthyroidism and is due to have a pill study. She's been doing well the last few days. No cough. Did have previous history of atrial fibrillation with RVR. She states she is on Plavix and Eliquis. No cough. No dysuria. States at the doctor's office yesterday her heart rate is in the 90s, which is where it typically runs.  Past Medical History  Diagnosis Date  . Hypertension   . Goiter   . Chronic kidney disease   . History of shingles 06/01/2013  . Abnormal EKG 07/31/2013  . GERD (gastroesophageal reflux disease)   . Carpal tunnel syndrome, bilateral   . Coronary artery disease   . Hyperlipidemia   . Anginal pain ([place])   . Asthma   . Pneumonia ~ 1976  . Type II diabetes mellitus (HCC)     insulin dependent  . Arthritis     "hands" (03/06/2015)  . Diabetic peripheral neuropathy ([place]) "since 1996"  . Atrial fibrillation with RVR ([place]) 04/29/2015  . Carpal tunnel syndrome, bilateral   . Tremors of nervous system    Past Surgical History  Procedure Laterality Date  . Tonsillectomy  1976  . Cesarean section  1982; 1984  . Carpal tunnel release Right Nov 2015  . Knee arthroscopy Right ~ 2003    "meniscus repair"  . Shoulder open rotator cuff repair Right 1996; 1998    "w/fracture repair"  . Foot neuroma surgery Bilateral 2000  .  Carpal tunnel release Right 1992; 05/2014    [name];   . Thyroid surgery  2000    "removed lots of nodules"  . Cardiac catheterization N/A 02/27/2015    Procedure: Left Heart Cath and Coronary Angiography;  Surgeon: [name], MD; LAD 40%, mCFX 80%, OM 70%, RCA 100% calcified       . Cardiac catheterization N/A 03/06/2015    Procedure: Coronary Stent Intervention;  Surgeon: [name], MD;  Location: [facility];  Service: Cardiovascular;  Laterality: N/A;  Mid CX 3.50x12 promus DES w/ 0% resdual and Prox OM1 2.50x20 promus DES w/ 20% residual  . Abdominal hysterectomy  1988    age 17; CERVICAL DYSPLASIA; ovaries intact.    Family History  Problem Relation Age of Onset  . Cancer Mother 21    bronchial cancer  . Hypertension Father   . COPD Father   . Heart disease Father 67    CAD with cardiac stenting  . Heart attack Father   . Allergies Sister   . Breast cancer Maternal Grandmother   . Emphysema Maternal Grandfather   . Leukemia Paternal Grandmother   . Emphysema Paternal Grandfather   . Thyroid disease Neg Hx   . Breast cancer Mother   . Lung cancer Mother   . Parkinson's  disease Father    Social History  Substance Use Topics  . Smoking status: Former Smoker -- 0.50 packs/day for 41 years    Types: Cigarettes  . Smokeless tobacco: Never Used     Comment: 04/29/2015 "quit smoking cigarettes 02/27/2015"  . Alcohol Use: No   OB History    No data available     Review of Systems  Constitutional: Negative for activity change and appetite change.  Eyes: Negative for pain.  Respiratory: Negative for chest tightness and shortness of breath.   Cardiovascular: Positive for chest pain and palpitations. Negative for leg swelling.  Gastrointestinal: Negative for nausea, abdominal pain and diarrhea.  Genitourinary: Negative for flank pain.  Musculoskeletal: Negative for back pain and neck stiffness.  Skin: Negative for rash.  Neurological: Positive  for light-headedness. Negative for weakness, numbness and headaches.  Psychiatric/Behavioral: Negative for behavioral problems.      Allergies  Novolog; Codeine; Iodine; Demerol; Ace inhibitors; Neosporin; Penicillins; Percocet; and Tape  Home Medications   Prior to Admission medications   Medication Sig Start Date End Date Taking? Authorizing Provider  apixaban (ELIQUIS) 5 MG TABS tablet Take 1 tablet (5 mg total) by mouth 2 (two) times daily. 05/01/15  Yes Bhavinkumar Bhagat, PA  atorvastatin (LIPITOR) 20 MG tablet Take 1 tablet (20 mg total) by mouth daily. 04/22/15  Yes Scott T Kathlen Mody, PA-C  buPROPion (WELLBUTRIN SR) 150 MG 12 hr tablet TAKE ONE TABLET BY MOUTH TWICE DAILY Patient taking differently: Take 150 mg by mouth 2 (two) times daily.  06/18/15  Yes [name], MD  clopidogrel (PLAVIX) 75 MG tablet Take 1 tablet (75 mg total) by mouth daily. 02/27/15  Yes [name], MD  cyclobenzaprine (FLEXERIL) 5 MG tablet Take 1 tablet (5 mg total) by mouth 3 (three) times daily as needed for muscle spasms. 07/31/15  Yes [name], MD  diltiazem (CARDIZEM CD) 240 MG 24 hr capsule Take 1 capsule (240 mg total) by mouth daily. 05/01/15  Yes Bhavinkumar Bhagat, PA  FLUoxetine (PROZAC) 20 MG capsule TAKE ONE CAPSULE BY MOUTH ONCE DAILY Patient taking differently: Take 20 mg by mouth at bedtime.  06/18/15  Yes [name], MD  gabapentin (NEURONTIN) 300 MG capsule Take 600-900 mg by mouth 3 (three) times daily. Patient take 2 capsules by mouth in morning and three capsules by mouth at bedtime.   Yes Historical Provider, MD  Insulin Detemir (LEVEMIR) 100 UNIT/ML Pen Inject 36 Units into the skin daily at 10 pm. 05/30/15  Yes [name], MD  insulin lispro (HUMALOG) 100 UNIT/ML injection Inject 0.05-0.1 mLs (5-10 Units total) into the skin 3 (three) times daily before meals. 06/18/15  Yes [name], MD  Insulin Pen Needle 32G X 6 MM MISC Use as directed 09/28/14  Yes Barton Fanny, MD  losartan (COZAAR) 50 MG tablet Take 1 tablet (50 mg total) by mouth daily. 03/13/15  Yes Scott T Kathlen Mody, PA-C  metFORMIN (GLUCOPHAGE) 1000 MG tablet TAKE ONE TABLET BY MOUTH TWICE DAILY WITH MEALS Patient taking differently: Take 1,000 mg by mouth 2 (two) times daily with a meal.  06/18/15  Yes [name], MD  metoprolol succinate (TOPROL-XL) 50 MG 24 hr tablet Take 1 tablet (50 mg total) by mouth daily. Take with or immediately following a meal. 03/13/15  Yes Scott T Kathlen Mody, PA-C  nitroGLYCERIN (NITROSTAT) 0.4 MG SL tablet Place 1 tablet (0.4 mg total) under the tongue every 5 (five) minutes as needed  for chest pain. 02/25/15  Yes Scott T Kathlen Mody, PA-C  ondansetron (ZOFRAN) 4 MG tablet Take 1 tablet (4 mg total) by mouth every 8 (eight) hours as needed for nausea or vomiting. 04/13/15  Yes [name], PA-C  pantoprazole (PROTONIX) 40 MG tablet Take 1 tablet (40 mg total) by mouth daily. 03/07/15  Yes Rhonda G Barrett, PA-C  traMADol (ULTRAM) 50 MG tablet Take 1-2 tablets qhs PRN neuropathy Patient taking differently: Take 50-100 mg by mouth at bedtime as needed (neuropathy).  06/03/15  Yes [name], MD  triamcinolone cream (KENALOG) 0.1 % Apply 1 application topically 2 (two) times daily. May mix 1:1 with eurerin cream at home 04/02/14  Yes [name], PA-C  albuterol (PROVENTIL HFA) 108 (90 BASE) MCG/ACT inhaler Inhale into the lungs every 6 (six) hours as needed for wheezing or shortness of breath.    Historical Provider, MD  glipiZIDE (GLUCOTROL XL) 5 MG 24 hr tablet Take 1 tablet (5 mg total) by mouth daily with lunch. Patient not taking: Reported on 08/15/2015 03/28/15   [name], MD   BP 140/76 mmHg  Pulse 85  Temp(Src) 98.2 F (36.8 C) (Oral)  Resp 22  Ht 5\' 4"  (1.626 m)  Wt 172 lb (78.019 kg)  BMI 29.51 kg/m2  SpO2 96% Physical Exam  Constitutional: She is oriented to person, place, and time. She appears well-developed and well-nourished.  HENT:   Head: Normocephalic and atraumatic.  Eyes: Pupils are equal, round, and reactive to light.  Cardiovascular: Normal rate, regular rhythm and normal heart sounds.   No murmur heard. Pulmonary/Chest: Effort normal and breath sounds normal. No respiratory distress. She has no wheezes. She has no rales.  Abdominal: Soft. Bowel sounds are normal. She exhibits no distension. There is no tenderness.  Musculoskeletal: Normal range of motion.  Neurological: She is alert and oriented to person, place, and time. No cranial nerve deficit.  Skin: Skin is warm.  Psychiatric: She has a normal mood and affect. Her speech is normal.  Nursing note and vitals reviewed.   ED Course  Procedures (including critical care time) Labs Review Labs Reviewed  BASIC METABOLIC PANEL - Abnormal; Notable for the following:    Chloride 98 (*)    Glucose, Bld 342 (*)    All other components within normal limits  CBC - Abnormal; Notable for the following:    Hemoglobin 11.4 (*)    HCT 35.0 (*)    All other components within normal limits  TROPONIN I    Imaging Review Dg Chest 2 View  08/15/2015  CLINICAL DATA:  65 year old female with tachycardia. Thyroid disease with planned radio iodine ablation next week. Initial encounter. EXAM: CHEST  2 VIEW COMPARISON:  04/29/2015 and earlier. FINDINGS: Stable borderline to mild cardiomegaly. Other mediastinal contours are within normal limits. Visualized tracheal air column is within normal limits. Lung volumes remain within normal limits. Mild eventration of the right hemidiaphragm. No pneumothorax, pulmonary edema, pleural effusion or confluent pulmonary opacity. Osteopenia. No acute osseous abnormality identified. IMPRESSION: No acute cardiopulmonary abnormality. Stable borderline to mild cardiomegaly. Electronically Signed   By: Genevie Ann M.D.   On: 08/15/2015 09:52   I have personally reviewed and evaluated these images and lab results as part of my medical  decision-making.   EKG Interpretation   Date/Time:  Friday August 15 2015 09:11:12 EDT Ventricular Rate:  95 PR Interval:  184 QRS Duration: 72 QT Interval:  366 QTC Calculation: 459 R Axis:  38 Text Interpretation:  Sinus rhythm with Premature atrial complexes Low  voltage QRS Cannot rule out Anterior infarct , age undetermined Abnormal  ECG Confirmed by Alvino Chapel  MD, Mahalie Kanner [phone]) on 08/15/2015 9:26:51 AM      MDM   Final diagnoses:  Tachycardia    Patient with feeling her heart was going fast. His hyperthyroidism is going to have more workup for that. Did have previous A. fib and is anticoagulated. Appears be sinus this time but unsure if it was sinus earlier. However if it was A. fib she is oriented quite elated. Rate is controlled now as his rhythm. Will discharge home to follow-up as needed.    Davonna Belling, MD 08/15/15 1154

## 2015-08-20 ENCOUNTER — Ambulatory Visit (INDEPENDENT_AMBULATORY_CARE_PROVIDER_SITE_OTHER): Payer: Commercial Managed Care - HMO

## 2015-08-20 DIAGNOSIS — I4891 Unspecified atrial fibrillation: Secondary | ICD-10-CM | POA: Diagnosis not present

## 2015-08-20 DIAGNOSIS — R413 Other amnesia: Secondary | ICD-10-CM | POA: Diagnosis not present

## 2015-08-20 DIAGNOSIS — I2510 Atherosclerotic heart disease of native coronary artery without angina pectoris: Secondary | ICD-10-CM

## 2015-08-20 DIAGNOSIS — E1142 Type 2 diabetes mellitus with diabetic polyneuropathy: Secondary | ICD-10-CM

## 2015-08-21 ENCOUNTER — Emergency Department (HOSPITAL_COMMUNITY)
Admission: EM | Admit: 2015-08-21 | Discharge: 2015-08-22 | Disposition: A | Discharge status: Home / Self Care | Payer: Commercial Managed Care - HMO | Attending: Emergency Medicine | Admitting: Emergency Medicine

## 2015-08-21 ENCOUNTER — Encounter (HOSPITAL_COMMUNITY): Payer: Self-pay | Admitting: Emergency Medicine

## 2015-08-21 ENCOUNTER — Encounter (HOSPITAL_COMMUNITY)
Admission: RE | Admit: 2015-08-21 | Discharge: 2015-08-21 | Disposition: A | Discharge status: Home / Self Care | Payer: Commercial Managed Care - HMO | Source: Ambulatory Visit | Attending: Endocrinology | Admitting: Endocrinology

## 2015-08-21 DIAGNOSIS — E1142 Type 2 diabetes mellitus with diabetic polyneuropathy: Secondary | ICD-10-CM | POA: Insufficient documentation

## 2015-08-21 DIAGNOSIS — N189 Chronic kidney disease, unspecified: Secondary | ICD-10-CM | POA: Insufficient documentation

## 2015-08-21 DIAGNOSIS — R197 Diarrhea, unspecified: Secondary | ICD-10-CM | POA: Diagnosis not present

## 2015-08-21 DIAGNOSIS — R1084 Generalized abdominal pain: Secondary | ICD-10-CM | POA: Diagnosis not present

## 2015-08-21 DIAGNOSIS — I25119 Atherosclerotic heart disease of native coronary artery with unspecified angina pectoris: Secondary | ICD-10-CM | POA: Diagnosis not present

## 2015-08-21 DIAGNOSIS — I129 Hypertensive chronic kidney disease with stage 1 through stage 4 chronic kidney disease, or unspecified chronic kidney disease: Secondary | ICD-10-CM | POA: Diagnosis not present

## 2015-08-21 DIAGNOSIS — J45909 Unspecified asthma, uncomplicated: Secondary | ICD-10-CM | POA: Insufficient documentation

## 2015-08-21 DIAGNOSIS — E0590 Thyrotoxicosis, unspecified without thyrotoxic crisis or storm: Secondary | ICD-10-CM | POA: Insufficient documentation

## 2015-08-21 LAB — COMPREHENSIVE METABOLIC PANEL
ALBUMIN: 3.7 g/dL (ref 3.5–5.0)
ALT: 19 U/L (ref 14–54)
ANION GAP: 14 (ref 5–15)
AST: 22 U/L (ref 15–41)
Alkaline Phosphatase: 82 U/L (ref 38–126)
BUN: 12 mg/dL (ref 6–20)
CALCIUM: 9.1 mg/dL (ref 8.9–10.3)
CHLORIDE: 95 mmol/L — AB (ref 101–111)
CO2: 26 mmol/L (ref 22–32)
Creatinine, Ser: 0.89 mg/dL (ref 0.44–1.00)
GFR calc non Af Amer: >60 mL/min (ref >60)
GLUCOSE: 298 mg/dL — AB (ref 65–99)
POTASSIUM: 3.4 mmol/L — AB (ref 3.5–5.1)
SODIUM: 135 mmol/L (ref 135–145)
Total Bilirubin: 0.5 mg/dL (ref 0.3–1.2)
Total Protein: 7.1 g/dL (ref 6.5–8.1)

## 2015-08-21 LAB — LIPASE, BLOOD: LIPASE: 21 U/L (ref 11–51)

## 2015-08-21 LAB — URINALYSIS, ROUTINE W REFLEX MICROSCOPIC
BILIRUBIN URINE: NEGATIVE
Glucose, UA: >1000 mg/dL — AB
HGB URINE DIPSTICK: NEGATIVE
Ketones, ur: NEGATIVE mg/dL
Leukocytes, UA: NEGATIVE
Nitrite: NEGATIVE
PH: 6.5 (ref 5.0–8.0)
Protein, ur: 100 mg/dL — AB
SPECIFIC GRAVITY, URINE: 1.028 (ref 1.005–1.030)

## 2015-08-21 LAB — CBC
HEMATOCRIT: 35.5 % — AB (ref 36.0–46.0)
HEMOGLOBIN: 11.6 g/dL — AB (ref 12.0–15.0)
MCH: 28.2 pg (ref 26.0–34.0)
MCHC: 32.7 g/dL (ref 30.0–36.0)
MCV: 86.4 fL (ref 78.0–100.0)
Platelets: 346 10*3/uL (ref 150–400)
RBC: 4.11 MIL/uL (ref 3.87–5.11)
RDW: 13.6 % (ref 11.5–15.5)
WBC: 12.5 10*3/uL — ABNORMAL HIGH (ref 4.0–10.5)

## 2015-08-21 LAB — URINE MICROSCOPIC-ADD ON: RBC / HPF: NONE SEEN RBC/hpf (ref 0–5)

## 2015-08-21 MED ORDER — SODIUM IODIDE I 131 CAPSULE
10.7000 | Freq: Once | INTRAVENOUS | Status: AC | PRN
  Administered 2015-08-21: 10.7 via ORAL

## 2015-08-21 NOTE — ED Notes (Signed)
Pt. reports multiple diarrhea last night and generalized abdominal pain today , denies nausea or vomitting , no fever or chills.

## 2015-08-21 NOTE — ED Notes (Signed)
Pt lwbs 

## 2015-08-22 ENCOUNTER — Encounter (HOSPITAL_COMMUNITY)
Admission: RE | Admit: 2015-08-22 | Discharge: 2015-08-22 | Disposition: A | Discharge status: Home / Self Care | Payer: Commercial Managed Care - HMO | Source: Ambulatory Visit | Attending: Endocrinology | Admitting: Endocrinology

## 2015-08-22 DIAGNOSIS — E0590 Thyrotoxicosis, unspecified without thyrotoxic crisis or storm: Secondary | ICD-10-CM | POA: Diagnosis not present

## 2015-08-22 MED ORDER — SODIUM PERTECHNETATE TC 99M INJECTION
1.0000 | Freq: Once | INTRAVENOUS | Status: AC | PRN
  Administered 2015-08-22: 1 via INTRAVENOUS

## 2015-08-24 ENCOUNTER — Other Ambulatory Visit: Payer: Self-pay | Admitting: Endocrinology

## 2015-08-24 MED ORDER — METHIMAZOLE 10 MG PO TABS
20.0000 mg | ORAL_TABLET | Freq: Two times a day (BID) | ORAL | Status: DC
Start: 2015-08-24 — End: 2015-10-15

## 2015-08-26 ENCOUNTER — Encounter (HOSPITAL_COMMUNITY): Admission: RE | Admit: 2015-08-26 | Payer: Commercial Managed Care - HMO | Source: Ambulatory Visit

## 2015-08-27 ENCOUNTER — Other Ambulatory Visit (HOSPITAL_COMMUNITY): Payer: Self-pay

## 2015-08-28 ENCOUNTER — Other Ambulatory Visit: Payer: Self-pay | Admitting: Family Medicine

## 2015-09-02 ENCOUNTER — Ambulatory Visit (INDEPENDENT_AMBULATORY_CARE_PROVIDER_SITE_OTHER): Payer: Commercial Managed Care - HMO | Admitting: Neurology

## 2015-09-02 DIAGNOSIS — E1142 Type 2 diabetes mellitus with diabetic polyneuropathy: Secondary | ICD-10-CM

## 2015-09-02 DIAGNOSIS — R413 Other amnesia: Secondary | ICD-10-CM

## 2015-09-02 DIAGNOSIS — R269 Unspecified abnormalities of gait and mobility: Secondary | ICD-10-CM | POA: Diagnosis not present

## 2015-09-02 DIAGNOSIS — R202 Paresthesia of skin: Secondary | ICD-10-CM

## 2015-09-02 DIAGNOSIS — I2510 Atherosclerotic heart disease of native coronary artery without angina pectoris: Secondary | ICD-10-CM

## 2015-09-02 DIAGNOSIS — E1140 Type 2 diabetes mellitus with diabetic neuropathy, unspecified: Secondary | ICD-10-CM | POA: Diagnosis not present

## 2015-09-02 DIAGNOSIS — I4891 Unspecified atrial fibrillation: Secondary | ICD-10-CM

## 2015-09-02 NOTE — Procedures (Signed)
   NCS (NERVE CONDUCTION STUDY) WITH EMG (ELECTROMYOGRAPHY) REPORT   STUDY DATE: September 02 2015 PATIENT NAME: Judith [name] DOB: 1950/09/06 MRN: [id number]    TECHNOLOGIST: Laretta Alstrom ELECTROMYOGRAPHER: Marcial Pacas M.D.  CLINICAL INFORMATION:  65 years old female, with history of diabetes, presented with bilateral lower extremity paresthesia, toe weakness, mild gait difficulty.  FINDINGS: NERVE CONDUCTION STUDY: Bilateral sural, peroneal sensory responses were absent. Bilateral peroneal to EDB and tibial motor responses showed severely decreased C map amplitude, with preserved distal latency, conduction velocity. Bilateral tibial H reflexes were absent.  Bilateral ulnar sensory and motor responses were normal. Bilateral median sensory responses showed mildly prolonged peak latency, with well preserved snap amplitude. Bilateral median motor responses showed mildly prolonged distal latency, with normal C map amplitude, conduction velocity.  NEEDLE ELECTROMYOGRAPHY: Selected needle examinations were performed at bilateral lower extremity muscles bilateral lumbosacral paraspinal muscles.  Bilateral tibialis anterior, medial gastrocnemius, peroneal longus: Normal insertion activity, no spontaneous activity, mildly enlarged motor unit potential with mildly decreased recruitment patterns.  Bilateral vastus lateralis, normal insertion activity, no spontaneous activity, mildly enlarged motor unit potential was mildly decreased recruitment patterns.  There was no spontaneous activity at bilateral lumbosacral paraspinals, bilateral L4-5 S1.  Right abductor pollicis longus: Increased insertional activity, 1 plus positive waves, enlarged complex motor unit potential with decreased recruitment patterns.  IMPRESSION: This is an abnormal study. There is electrodiagnostic evidence of mildly length dependent axonal peripheral neuropathy. There is also electrodiagnostic evidence of mild chronic  neuropathic changes involving bilateral L4-L5 myotomes, consistent with chronic bilateral lumbosacral radiculopathy. In addition, there is evidence of bilateral median neuropathy across the wrist, consistent with mild to moderate carpal tunnel syndromes.   INTERPRETING PHYSICIAN:   Marcial Pacas M.D. Ph.D. [facility] [address] [place], [place] [address] [phone]

## 2015-09-02 NOTE — Progress Notes (Signed)
PATIENT: Judith [name] DOB: 1950/06/30  No chief complaint on file.    HISTORICAL  Judith [name] is a 65 years old right-handed female, seen in refer by her primary care physician Dr. [name] he August 12 2015 for evaluation of bilateral hands tremor, falling episode, and memory loss, she is accompanied by [name]   She had a history of hypertension, hyperlipidemia, diabetes for more than 20 years, atrial fibrillation, on chronic Eliquis treatment, coronary artery disease, status post stent, chronic kidney disease, she is a retired Education officer, museum, now works for [employer], including heavy lifting  She was noted to have memory loss over the past few years, she needs to be reminded about appointment, had word finding difficulties, she still drives without getting loss, she lives with her sister.  Her father has Parkinson's disease, she noted bilateral hands shaking since 2012, getting worse holding small subject, writing, her sister also suffered similar bilateral hands shaking.  She had long-standing history of diabetic peripheral neuropathy, complains of bilateral feet numbness tingling, taking gabapentin and the muscle relaxant.  She had a few fall accident, she also felt lightheaded dizzy when she first got up from overnight sleep, occasionally falling down when she get up quickly after prolonged sitting, she denies significant gait difficulty, no bowel and bladder incontinence, over the years, she had worsening progressing ascending paresthesia, now has fingertips numbness tingling, mild bilateral hands weakness, mild difficulty opening bottles.   Laboratory evaluation showed mild low TSH, she is going through treatment for hyperthyroidism, low normal B12 290, vitamin D was mildly low 28, normal CMP, with exception of elevated glucose, 311, CBC, A1c was elevated 8.2  I have personally reviewed CAT scan in January 2017, CAT scan of the brain, mild generalized  atrophy, periventricular small vessel disease, calcification at left thalamus   CAT scan of cervical spine, multilevel mild degenerative changes.  Update September 02 2015: She came in for electrodiagnostic study today, which showed evidence of mild axonal peripheral neuropathy consistent with her poorly controlled diabetes, in addition, there is mild bilateral lumbosacral radiculopathy involving bilateral L4-L5 myotomes. There is also evidence of mild to moderate bilateral carpal tunnel syndromes,  She has frequent dizziness spells, usually related to sudden positional change, suggestive of orthostatic hypotension, consistent with her diabetes, she also complains of bilateral hands paresthesia, has to shake her hands after driving for a while, likely related to her bilateral carpal tunnel syndromes.  We have personally reviewed MRI of the brain without contrast in April 2017, mild atrophy, supratentorium small vessel disease, no acute abnormality.  REVIEW OF SYSTEMS: Full 14 system review of systems performed and notable only for as above  ALLERGIES: Allergies  Allergen Reactions  . Novolog [Insulin Aspart] Shortness Of Breath and Other (See Comments)    "breathing problems"  . Codeine Nausea And Vomiting and Other (See Comments)    HIGH DOSES-SEVERE VOMITING  . Iodine Other (See Comments)    MUST HAVE BENADRYL PRIOR TO PROCEDURE AND RIGHT BEFORE TREATMENT TO COUNTERACT REACTION-BLISTERING REACTION DERMATOLOGICAL  . Demerol [Meperidine] Nausea And Vomiting  . Ace Inhibitors Cough  . Neosporin [Neomycin-Bacitracin Zn-Polymyx] Itching and Rash    MAKES REACTIONS WORSE WHEN USING AS PROPHYLACTIC  . Penicillins Itching, Rash and Other (See Comments)    CHEST SIZED RASH AND ITCHING   . Percocet [Oxycodone-Acetaminophen] Rash  . Tape Itching and Rash    HOME MEDICATIONS: Current Outpatient Prescriptions  Medication Sig Dispense Refill  .  albuterol (PROVENTIL HFA) 108 (90 BASE) MCG/ACT  inhaler Inhale into the lungs every 6 (six) hours as needed for wheezing or shortness of breath.    Marland Kitchen apixaban (ELIQUIS) 5 MG TABS tablet Take 1 tablet (5 mg total) by mouth 2 (two) times daily. 60 tablet 11  . atorvastatin (LIPITOR) 20 MG tablet Take 1 tablet (20 mg total) by mouth daily. 90 tablet 1  . buPROPion (WELLBUTRIN SR) 150 MG 12 hr tablet TAKE ONE TABLET BY MOUTH TWICE DAILY (Patient taking differently: Take 150 mg by mouth 2 (two) times daily. ) 180 tablet 0  . clopidogrel (PLAVIX) 75 MG tablet Take 1 tablet (75 mg total) by mouth daily. 30 tablet 11  . cyclobenzaprine (FLEXERIL) 5 MG tablet Take 1 tablet (5 mg total) by mouth 3 (three) times daily as needed for muscle spasms. 30 tablet 2  . diltiazem (CARDIZEM CD) 240 MG 24 hr capsule Take 1 capsule (240 mg total) by mouth daily. 30 capsule 11  . FLUoxetine (PROZAC) 20 MG capsule TAKE ONE CAPSULE BY MOUTH ONCE DAILY 90 capsule 0  . gabapentin (NEURONTIN) 300 MG capsule Take 600-900 mg by mouth 3 (three) times daily. Patient take 2 capsules by mouth in morning and three capsules by mouth at bedtime.    Marland Kitchen glipiZIDE (GLUCOTROL XL) 5 MG 24 hr tablet Take 1 tablet (5 mg total) by mouth daily with lunch. (Patient not taking: Reported on 08/15/2015) 90 tablet 1  . Insulin Detemir (LEVEMIR) 100 UNIT/ML Pen Inject 36 Units into the skin daily at 10 pm. 15 mL 11  . insulin lispro (HUMALOG) 100 UNIT/ML injection Inject 0.05-0.1 mLs (5-10 Units total) into the skin 3 (three) times daily before meals. 10 mL 2  . Insulin Pen Needle 32G X 6 MM MISC Use as directed 50 each 5  . losartan (COZAAR) 50 MG tablet Take 1 tablet (50 mg total) by mouth daily. 30 tablet 11  . metFORMIN (GLUCOPHAGE) 1000 MG tablet TAKE ONE TABLET BY MOUTH TWICE DAILY WITH MEALS (Patient taking differently: Take 1,000 mg by mouth 2 (two) times daily with a meal. ) 180 tablet 0  . methimazole (TAPAZOLE) 10 MG tablet Take 2 tablets (20 mg total) by mouth 2 (two) times daily. 120 tablet  1  . metoprolol succinate (TOPROL-XL) 50 MG 24 hr tablet Take 1 tablet (50 mg total) by mouth daily. Take with or immediately following a meal. 30 tablet 11  . nitroGLYCERIN (NITROSTAT) 0.4 MG SL tablet Place 1 tablet (0.4 mg total) under the tongue every 5 (five) minutes as needed for chest pain. 25 tablet 3  . ondansetron (ZOFRAN) 4 MG tablet Take 1 tablet (4 mg total) by mouth every 8 (eight) hours as needed for nausea or vomiting. 20 tablet 0  . pantoprazole (PROTONIX) 40 MG tablet Take 1 tablet (40 mg total) by mouth daily. 30 tablet 11  . traMADol (ULTRAM) 50 MG tablet Take 1-2 tablets qhs PRN neuropathy (Patient taking differently: Take 50-100 mg by mouth at bedtime as needed (neuropathy). ) 60 tablet 5  . triamcinolone cream (KENALOG) 0.1 % Apply 1 application topically 2 (two) times daily. May mix 1:1 with eurerin cream at home 454 g 0   No current facility-administered medications for this visit.    PAST MEDICAL HISTORY: Past Medical History  Diagnosis Date  . Hypertension   . Goiter   . Chronic kidney disease   . History of shingles 06/01/2013  . Abnormal EKG 07/31/2013  . GERD (  gastroesophageal reflux disease)   . Carpal tunnel syndrome, bilateral   . Coronary artery disease   . Hyperlipidemia   . Anginal pain ([place])   . Asthma   . Pneumonia ~ 1976  . Type II diabetes mellitus ([facility])     insulin dependent  . Arthritis     "hands" (03/06/2015)  . Diabetic peripheral neuropathy ([place]) "since 1996"  . Atrial fibrillation with RVR ([place]) 04/29/2015  . Carpal tunnel syndrome, bilateral   . Tremors of nervous system     PAST SURGICAL HISTORY: Past Surgical History  Procedure Laterality Date  . Tonsillectomy  1976  . Cesarean section  1982; 1984  . Carpal tunnel release Right Nov 2015  . Knee arthroscopy Right ~ 2003    "meniscus repair"  . Shoulder open rotator cuff repair Right 1996; 1998    "w/fracture repair"  . Foot neuroma surgery Bilateral 2000  . Carpal tunnel  release Right 1992; 05/2014    [name]; [place]  . Thyroid surgery  2000    "removed lots of nodules"  . Cardiac catheterization N/A 02/27/2015    Procedure: Left Heart Cath and Coronary Angiography;  Surgeon: [name], MD; LAD 40%, mCFX 80%, OM 70%, RCA 100% calcified       . Cardiac catheterization N/A 03/06/2015    Procedure: Coronary Stent Intervention;  Surgeon: [name], MD;  Location: [facility];  Service: Cardiovascular;  Laterality: N/A;  Mid CX 3.50x12 promus DES w/ 0% resdual and Prox OM1 2.50x20 promus DES w/ 20% residual  . Abdominal hysterectomy  1988    age 49; CERVICAL DYSPLASIA; ovaries intact.     FAMILY HISTORY: Family History  Problem Relation Age of Onset  . Cancer Mother 80    bronchial cancer  . Hypertension Father   . COPD Father   . Heart disease Father 68    CAD with cardiac stenting  . Heart attack Father   . Allergies Sister   . Breast cancer Maternal Grandmother   . Emphysema Maternal Grandfather   . Leukemia Paternal Grandmother   . Emphysema Paternal Grandfather   . Thyroid disease Neg Hx   . Breast cancer Mother   . Lung cancer Mother   . Parkinson's disease Father     SOCIAL HISTORY:  Social History   Social History  . Marital Status: Divorced    Spouse Name: N/A  . Number of Children: 2  . Years of Education: Masters   Occupational History  . Food Therapist, nutritional    Social History Main Topics  . Smoking status: Former Smoker -- 0.00 packs/day for 41 years    Types: Cigarettes  . Smokeless tobacco: Never Used     Comment: 04/29/2015 "quit smoking cigarettes 02/27/2015"  . Alcohol Use: No  . Drug Use: No  . Sexual Activity: Not Currently    Birth Control/ Protection: Post-menopausal, Surgical   Other Topics Concern  . 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      Lives with sister in [place].      Tobacco: 1 ppd x 41 years - quit 2016      Alcohol: none      Drugs: none      Exercise:  Walking in neighborhood; physical job.   Right-handed.   2 cups caffeine daily.        PHYSICAL EXAM   There were no vitals filed for this visit.  Not recorded      There is no weight on file to calculate BMI.  PHYSICAL EXAMNIATION:  Gen: NAD, conversant, well nourised, obese, well groomed                     Cardiovascular: Regular rate rhythm, no peripheral edema, warm, nontender. Eyes: Conjunctivae clear without exudates or hemorrhage Neck: Supple, no carotid bruise. Pulmonary: Clear to auscultation bilaterally   NEUROLOGICAL EXAM:  MENTAL STATUS: Speech:    Speech is normal; fluent and spontaneous with normal comprehension.  Cognition:Mini-Mental Status Examination 27/30     Orientation to time, place and person     Recent and remote memory: She missed one out of 3 recall     Attention span and concentration: She has mild difficulty spelling WORLD backward     Normal Language, naming, repeating,spontaneous speech     Fund of knowledge   CRANIAL NERVES: CN II: Visual fields are full to confrontation. Fundoscopic exam is normal with sharp discs and no vascular changes. Pupils are round equal and briskly reactive to light. CN III, IV, VI: extraocular movement are normal. No ptosis. CN V: Facial sensation is intact to pinprick in all 3 divisions bilaterally. Corneal responses are intact.  CN VII: Face is symmetric with normal eye closure and smile. CN VIII: Hearing is normal to rubbing fingers CN IX, X: Palate elevates symmetrically. Phonation is normal. CN XI: Head turning and shoulder shrug are intact CN XII: Tongue is midline with normal movements and no atrophy.  MOTOR: There is no pronator drift of out-stretched arms. Muscle bulk and tone are  normal. Muscle strength is normal.  REFLEXES: Reflexes are 2+ and symmetric at the biceps, triceps, knees, and absent at  ankles. Plantar responses are flexor.  SENSORY: Intact to light touch, pinprick, positional sensation and vibratory sensation are intact in fingers and toes.  COORDINATION: Rapid alternating movements and fine finger movements are intact. There is no dysmetria on finger-to-nose and heel-knee-shin.    GAIT/STANCE: She needs to push up to get up from seated position, mildly unsteady, cautious gait, has difficulty with tandem walking   DIAGNOSTIC DATA (LABS, IMAGING, TESTING) - I reviewed patient records, labs, notes, testing and imaging myself where available.   ASSESSMENT AND PLAN  Ellanie Virrueta is a 65 y.o. female   Mild cognitive impairment  Mini-Mental Status Examination 27/30  No treatable cause find on laboratory evaluation  MRI of the brain showed mild atrophy, supratentorium small vessel disease  Most consistent with central nervous system degenerative disorder her poorly controlled diabetes likely contribute as well  I have advised her moderate exercise, tight control of her diabetes,   Diabetic peripheral neuropathy  Orthostatic blood pressure changes will explain her complaints of dizziness with sudden positional change, frequent falling   Unsteady gait:  She has mild bilateral toe extension weakness, chronic bilateral lumbosacral radiculopathy, and orthostatic hypotension all contributed to her complains of unsteady gait  She was started water aerobic exercise soon    Marcial Pacas, M.D. Ph.D.  [facility] [address] [place], [place] [address] Ph: [phone] Fax: [phone]  CC: [name], MD

## 2015-09-11 ENCOUNTER — Ambulatory Visit: Payer: Self-pay | Admitting: Cardiology

## 2015-09-15 ENCOUNTER — Telehealth: Payer: Self-pay | Admitting: Gastroenterology

## 2015-09-15 NOTE — Telephone Encounter (Addendum)
The patient asks for clarification on what is required for her to get an EGD/colon done. She was put off in the fall because of the need to come off anti-coagulation. Do you want her to also see Cardiology?  (she was a no show for an appointment with Dr Radford Pax 09/11/15) Do you need to see her? She has an appointment with you on 09/25/15.

## 2015-09-17 ENCOUNTER — Encounter: Payer: Self-pay | Admitting: Cardiology

## 2015-09-17 NOTE — Telephone Encounter (Signed)
She will need cardiology follow up visit and if they are ok to hold anticoagulation for the procedure, we can proceed with EGD and colonoscopy. Thanks

## 2015-09-18 NOTE — Telephone Encounter (Signed)
Left message for the patient advising to schedule with her cardiologist. I did not cancel her OV with Dr Silverio Decamp yet in case she does not get the message.

## 2015-09-25 ENCOUNTER — Ambulatory Visit: Payer: Self-pay | Admitting: Gastroenterology

## 2015-09-29 NOTE — Telephone Encounter (Signed)
At 10:39am called to cancel her 09-25-15 10am appointment because she went to the wrong office. Said she would call back to reschedule. She is calling today for another appointment.Next available is 12-01-15. Declined to schedule because she says she needs to be seen sooner.

## 2015-09-30 NOTE — Telephone Encounter (Signed)
Spoke with the patient. She has not seen her cardiologist as recommended. She asks if I will make that appointment for her. Advised patient she should call for her appointment as soon as possible, that I would not be able to get her an appointment any sooner that she could. Reviewed the phone note from earlier this month in which this was discussed. She states understanding that Dr Silverio Decamp wants her to see her cardiologist before procedure.

## 2015-10-08 ENCOUNTER — Other Ambulatory Visit: Payer: Self-pay | Admitting: Family Medicine

## 2015-10-09 ENCOUNTER — Emergency Department (HOSPITAL_COMMUNITY): Payer: Commercial Managed Care - HMO

## 2015-10-09 ENCOUNTER — Emergency Department (HOSPITAL_COMMUNITY)
Admission: EM | Admit: 2015-10-09 | Discharge: 2015-10-09 | Disposition: A | Discharge status: Home / Self Care | Payer: Commercial Managed Care - HMO | Attending: Emergency Medicine | Admitting: Emergency Medicine

## 2015-10-09 ENCOUNTER — Encounter (HOSPITAL_COMMUNITY): Payer: Self-pay

## 2015-10-09 DIAGNOSIS — I129 Hypertensive chronic kidney disease with stage 1 through stage 4 chronic kidney disease, or unspecified chronic kidney disease: Secondary | ICD-10-CM | POA: Insufficient documentation

## 2015-10-09 DIAGNOSIS — R079 Chest pain, unspecified: Secondary | ICD-10-CM | POA: Diagnosis present

## 2015-10-09 DIAGNOSIS — J45909 Unspecified asthma, uncomplicated: Secondary | ICD-10-CM | POA: Insufficient documentation

## 2015-10-09 DIAGNOSIS — Z794 Long term (current) use of insulin: Secondary | ICD-10-CM | POA: Insufficient documentation

## 2015-10-09 DIAGNOSIS — E1122 Type 2 diabetes mellitus with diabetic chronic kidney disease: Secondary | ICD-10-CM | POA: Insufficient documentation

## 2015-10-09 DIAGNOSIS — Z87891 Personal history of nicotine dependence: Secondary | ICD-10-CM | POA: Diagnosis not present

## 2015-10-09 DIAGNOSIS — I2510 Atherosclerotic heart disease of native coronary artery without angina pectoris: Secondary | ICD-10-CM | POA: Diagnosis not present

## 2015-10-09 DIAGNOSIS — Z79899 Other long term (current) drug therapy: Secondary | ICD-10-CM | POA: Diagnosis not present

## 2015-10-09 DIAGNOSIS — I4891 Unspecified atrial fibrillation: Secondary | ICD-10-CM | POA: Diagnosis not present

## 2015-10-09 DIAGNOSIS — N189 Chronic kidney disease, unspecified: Secondary | ICD-10-CM | POA: Insufficient documentation

## 2015-10-09 DIAGNOSIS — Z7901 Long term (current) use of anticoagulants: Secondary | ICD-10-CM | POA: Insufficient documentation

## 2015-10-09 DIAGNOSIS — Z7984 Long term (current) use of oral hypoglycemic drugs: Secondary | ICD-10-CM | POA: Diagnosis not present

## 2015-10-09 DIAGNOSIS — E1140 Type 2 diabetes mellitus with diabetic neuropathy, unspecified: Secondary | ICD-10-CM | POA: Insufficient documentation

## 2015-10-09 LAB — BASIC METABOLIC PANEL
ANION GAP: 13 (ref 5–15)
BUN: 16 mg/dL (ref 6–20)
CHLORIDE: 98 mmol/L — AB (ref 101–111)
CO2: 23 mmol/L (ref 22–32)
Calcium: 9.9 mg/dL (ref 8.9–10.3)
Creatinine, Ser: 0.84 mg/dL (ref 0.44–1.00)
GFR calc Af Amer: >60 mL/min (ref >60)
GFR calc non Af Amer: >60 mL/min (ref >60)
GLUCOSE: 246 mg/dL — AB (ref 65–99)
POTASSIUM: 4.9 mmol/L (ref 3.5–5.1)
Sodium: 134 mmol/L — ABNORMAL LOW (ref 135–145)

## 2015-10-09 LAB — CBC
HEMATOCRIT: 39.9 % (ref 36.0–46.0)
HEMOGLOBIN: 13.2 g/dL (ref 12.0–15.0)
MCH: 28.5 pg (ref 26.0–34.0)
MCHC: 33.1 g/dL (ref 30.0–36.0)
MCV: 86.2 fL (ref 78.0–100.0)
Platelets: 429 10*3/uL — ABNORMAL HIGH (ref 150–400)
RBC: 4.63 MIL/uL (ref 3.87–5.11)
RDW: 13.4 % (ref 11.5–15.5)
WBC: 11.5 10*3/uL — ABNORMAL HIGH (ref 4.0–10.5)

## 2015-10-09 LAB — I-STAT TROPONIN, ED: Troponin i, poc: 0.13 ng/mL (ref 0.00–0.08)

## 2015-10-09 MED ORDER — DILTIAZEM HCL 25 MG/5ML IV SOLN
20.0000 mg | INTRAVENOUS | Status: AC
  Administered 2015-10-09: 20 mg via INTRAVENOUS
  Filled 2015-10-09: qty 5

## 2015-10-09 MED ORDER — MIDAZOLAM HCL 2 MG/2ML IJ SOLN
2.0000 mg | Freq: Once | INTRAMUSCULAR | Status: AC
  Administered 2015-10-09: 2 mg via INTRAVENOUS
  Filled 2015-10-09: qty 2

## 2015-10-09 NOTE — Code Documentation (Signed)
Pt stable, remains in rapid A-fib.  O2, suction, PIV, BVM confirmed, pt placed on Zoll and consent signed

## 2015-10-09 NOTE — Discharge Instructions (Signed)

## 2015-10-09 NOTE — ED Notes (Signed)
Patient here with chest pain, dizziness and nausea that awoke her at 0100. Took her blood sugar that was fine this am and a zofran for the nausea, arrived with ongoing chest tightness

## 2015-10-09 NOTE — ED Provider Notes (Addendum)
CSN: [id number]     Arrival date & time 10/09/15  0845 History   First MD Initiated Contact with Patient 10/09/15 [phone]     Chief Complaint  Patient presents with  . Chest Pain     (Consider location/radiation/quality/duration/timing/severity/associated sxs/prior Treatment) HPI Comments: Patient with a history of hypertension, chronic kidney disease, diabetes, atrial fibrillation, coronary artery disease with prior stent placement presents with palpitations. She states about 1 AM this morning she noted that her heart was racing. She feels like it's been racing since that time. She's had associated shortness of breath, chest tightness and lightheadedness. She is currently on Eliquis. She states she hasn't missed any doses of her Eliquis. She denies any fevers coughing congestion or other recent illnesses. Her cardiologist is Dr. Radford Pax.  Patient is a 65 y.o. female presenting with chest pain.  Chest Pain Associated symptoms: fatigue, palpitations and shortness of breath   Associated symptoms: no abdominal pain, no back pain, no cough, no diaphoresis, no dizziness, no fever, no headache, no nausea, no numbness, not vomiting and no weakness     Past Medical History  Diagnosis Date  . Hypertension   . Goiter   . Chronic kidney disease   . History of shingles 06/01/2013  . Abnormal EKG 07/31/2013  . GERD (gastroesophageal reflux disease)   . Carpal tunnel syndrome, bilateral   . Coronary artery disease   . Hyperlipidemia   . Anginal pain ([place])   . Asthma   . Pneumonia ~ 1976  . Type II diabetes mellitus (HCC)     insulin dependent  . Arthritis     "hands" (03/06/2015)  . Diabetic peripheral neuropathy ([place]) "since 1996"  . Atrial fibrillation with RVR ([place]) 04/29/2015  . Carpal tunnel syndrome, bilateral   . Tremors of nervous system    Past Surgical History  Procedure Laterality Date  . Tonsillectomy  1976  . Cesarean section  1982; 1984  . Carpal tunnel release Right Nov 2015   . Knee arthroscopy Right ~ 2003    "meniscus repair"  . Shoulder open rotator cuff repair Right 1996; 1998    "w/fracture repair"  . Foot neuroma surgery Bilateral 2000  . Carpal tunnel release Right 1992; 05/2014    [name]; [place]  . Thyroid surgery  2000    "removed lots of nodules"  . Cardiac catheterization N/A 02/27/2015    Procedure: Left Heart Cath and Coronary Angiography;  Surgeon: [name], MD; LAD 40%, mCFX 80%, OM 70%, RCA 100% calcified       . Cardiac catheterization N/A 03/06/2015    Procedure: Coronary Stent Intervention;  Surgeon: [name], MD;  Location: [facility];  Service: Cardiovascular;  Laterality: N/A;  Mid CX 3.50x12 promus DES w/ 0% resdual and Prox OM1 2.50x20 promus DES w/ 20% residual  . Abdominal hysterectomy  1988    age 76; CERVICAL DYSPLASIA; ovaries intact.    Family History  Problem Relation Age of Onset  . Cancer Mother 28    bronchial cancer  . Hypertension Father   . COPD Father   . Heart disease Father 13    CAD with cardiac stenting  . Heart attack Father   . Allergies Sister   . Breast cancer Maternal Grandmother   . Emphysema Maternal Grandfather   . Leukemia Paternal Grandmother   . Emphysema Paternal Grandfather   . Thyroid disease Neg Hx   . Breast cancer Mother   . Lung cancer Mother   .  Parkinson's disease Father    Social History  Substance Use Topics  . Smoking status: Former Smoker -- 0.00 packs/day for 41 years    Types: Cigarettes  . Smokeless tobacco: Never Used     Comment: 04/29/2015 "quit smoking cigarettes 02/27/2015"  . Alcohol Use: No   OB History    No data available     Review of Systems  Constitutional: Positive for fatigue. Negative for fever, chills and diaphoresis.  HENT: Negative for congestion, rhinorrhea and sneezing.   Eyes: Negative.   Respiratory: Positive for chest tightness and shortness of breath. Negative for cough.   Cardiovascular: Positive for chest pain and  palpitations. Negative for leg swelling.  Gastrointestinal: Negative for nausea, vomiting, abdominal pain, diarrhea and blood in stool.  Genitourinary: Negative for frequency, hematuria, flank pain and difficulty urinating.  Musculoskeletal: Negative for back pain and arthralgias.  Skin: Negative for rash.  Neurological: Positive for light-headedness. Negative for dizziness, speech difficulty, weakness, numbness and headaches.      Allergies  Novolog; Codeine; Iodine; Demerol; Ace inhibitors; Neosporin; Penicillins; Percocet; and Tape  Home Medications   Prior to Admission medications   Medication Sig Start Date End Date Taking? Authorizing Provider  albuterol (PROVENTIL HFA) 108 (90 BASE) MCG/ACT inhaler Inhale into the lungs every 6 (six) hours as needed for wheezing or shortness of breath.    Historical Provider, MD  apixaban (ELIQUIS) 5 MG TABS tablet Take 1 tablet (5 mg total) by mouth 2 (two) times daily. 05/01/15   Bhavinkumar Bhagat, PA  atorvastatin (LIPITOR) 20 MG tablet Take 1 tablet (20 mg total) by mouth daily. 04/22/15   Liliane Shi, PA-C  buPROPion (WELLBUTRIN SR) 150 MG 12 hr tablet TAKE ONE TABLET BY MOUTH TWICE DAILY Patient taking differently: Take 150 mg by mouth 2 (two) times daily.  06/18/15   [name], MD  clopidogrel (PLAVIX) 75 MG tablet Take 1 tablet (75 mg total) by mouth daily. 02/27/15   [name], MD  cyclobenzaprine (FLEXERIL) 5 MG tablet Take 1 tablet (5 mg total) by mouth 3 (three) times daily as needed for muscle spasms. 07/31/15   [name], MD  diltiazem (CARDIZEM CD) 240 MG 24 hr capsule Take 1 capsule (240 mg total) by mouth daily. 05/01/15   Bhavinkumar Bhagat, PA  FLUoxetine (PROZAC) 20 MG capsule TAKE ONE CAPSULE BY MOUTH ONCE DAILY 08/29/15   [name], MD  gabapentin (NEURONTIN) 300 MG capsule Take 600-900 mg by mouth 3 (three) times daily. Patient take 2 capsules by mouth in morning and three capsules by mouth at bedtime.     Historical Provider, MD  glipiZIDE (GLUCOTROL XL) 5 MG 24 hr tablet Take 1 tablet (5 mg total) by mouth daily with lunch. Patient not taking: Reported on 08/15/2015 03/28/15   [name], MD  Insulin Detemir (LEVEMIR) 100 UNIT/ML Pen Inject 36 Units into the skin daily at 10 pm. 05/30/15   [name], MD  insulin lispro (HUMALOG) 100 UNIT/ML injection Inject 0.05-0.1 mLs (5-10 Units total) into the skin 3 (three) times daily before meals. 06/18/15   [name], MD  Insulin Pen Needle 32G X 6 MM MISC Use as directed 09/28/14   Barton Fanny, MD  losartan (COZAAR) 50 MG tablet Take 1 tablet (50 mg total) by mouth daily. 03/13/15   Liliane Shi, PA-C  metFORMIN (GLUCOPHAGE) 1000 MG tablet TAKE ONE TABLET BY MOUTH TWICE DAILY WITH MEALS Patient taking differently: Take 1,000  mg by mouth 2 (two) times daily with a meal.  06/18/15   [name], MD  methimazole (TAPAZOLE) 10 MG tablet Take 2 tablets (20 mg total) by mouth 2 (two) times daily. 08/24/15   Renato Shin, MD  metoprolol succinate (TOPROL-XL) 50 MG 24 hr tablet Take 1 tablet (50 mg total) by mouth daily. Take with or immediately following a meal. 03/13/15   Liliane Shi, PA-C  nitroGLYCERIN (NITROSTAT) 0.4 MG SL tablet Place 1 tablet (0.4 mg total) under the tongue every 5 (five) minutes as needed for chest pain. 02/25/15   Liliane Shi, PA-C  ondansetron (ZOFRAN) 4 MG tablet Take 1 tablet (4 mg total) by mouth every 8 (eight) hours as needed for nausea or vomiting. 04/13/15   [name], PA-C  pantoprazole (PROTONIX) 40 MG tablet Take 1 tablet (40 mg total) by mouth daily. 03/07/15   Evelene Croon Barrett, PA-C  traMADol (ULTRAM) 50 MG tablet Take 1-2 tablets qhs PRN neuropathy Patient taking differently: Take 50-100 mg by mouth at bedtime as needed (neuropathy).  06/03/15   [name], MD  triamcinolone cream (KENALOG) 0.1 % Apply 1 application topically 2 (two) times daily. May mix 1:1 with eurerin cream at home 04/02/14    [name], PA-C   BP 112/57 mmHg  Pulse 87  Temp(Src) 98.1 F (36.7 C) (Oral)  Resp 18  Ht 5\' 4"  (1.626 m)  Wt 179 lb (81.194 kg)  BMI 30.71 kg/m2  SpO2 100% Physical Exam  Constitutional: She is oriented to person, place, and time. She appears well-developed and well-nourished.  HENT:  Head: Normocephalic and atraumatic.  Eyes: Pupils are equal, round, and reactive to light.  Neck: Normal range of motion. Neck supple.  Cardiovascular: Regular rhythm and normal heart sounds.  Tachycardia present.   Pulmonary/Chest: Effort normal and breath sounds normal. No respiratory distress. She has no wheezes. She has no rales. She exhibits no tenderness.  Abdominal: Soft. Bowel sounds are normal. There is no tenderness. There is no rebound and no guarding.  Musculoskeletal: Normal range of motion. She exhibits no edema.  Lymphadenopathy:    She has no cervical adenopathy.  Neurological: She is alert and oriented to person, place, and time.  Skin: Skin is warm and dry. No rash noted.  Psychiatric: She has a normal mood and affect.    ED Course  .Cardioversion Date/Time: 10/09/2015 11:30 AM Performed by: Malvin Johns Authorized by: Malvin Johns Consent: Written consent obtained. Risks and benefits: risks, benefits and alternatives were discussed Consent given by: patient Patient understanding: patient states understanding of the procedure being performed Patient consent: the patient's understanding of the procedure matches consent given Procedure consent: procedure consent matches procedure scheduled Relevant documents: relevant documents present and verified Test results: test results available and properly labeled Imaging studies: imaging studies available Patient identity confirmed: verbally with patient Time out: Immediately prior to procedure a "time out" was called to verify the correct patient, procedure, equipment, support staff and site/side marked as required. Patient  sedated: yes Sedation type: anxiolysis Sedatives: midazolam Sedation start date/time: 10/09/2015 11:00 AM Sedation end date/time: 10/09/2015 11:15 AM Cardioversion basis: emergent Pre-procedure rhythm: atrial fibrillation Patient position: patient was placed in a supine position Chest area: chest area exposed Electrodes: pads Electrodes placed: anterior-posterior Number of attempts: 1 Attempt 1 mode: synchronous Attempt 1 waveform: biphasic Attempt 1 shock (in Joules): 200 Attempt 1 outcome: conversion to normal sinus rhythm Post-procedure rhythm: normal sinus rhythm Complications: no  complications Patient tolerance: Patient tolerated the procedure well with no immediate complications   (including critical care time) Labs Review Labs Reviewed  BASIC METABOLIC PANEL - Abnormal; Notable for the following:    Sodium 134 (*)    Chloride 98 (*)    Glucose, Bld 246 (*)    All other components within normal limits  CBC - Abnormal; Notable for the following:    WBC 11.5 (*)    Platelets 429 (*)    All other components within normal limits  I-STAT TROPOININ, ED - Abnormal; Notable for the following:    Troponin i, poc 0.13 (*)    All other components within normal limits    Imaging Review Dg Chest Portable 1 View  10/09/2015  CLINICAL DATA:  Patient with palpitation.  Prior cardiac stents. EXAM: PORTABLE CHEST 1 VIEW COMPARISON:  Chest radiograph 08/15/2015. FINDINGS: Multiple monitoring leads overlie the patient. Stable cardiac and mediastinal contours. No consolidative pulmonary opacities. No pleural effusion or pneumothorax. IMPRESSION: No acute cardiopulmonary process. Electronically Signed   By: Lovey Newcomer M.D.   On: 10/09/2015 09:19   I have personally reviewed and evaluated these images and lab results as part of my medical decision-making.   EKG Interpretation   Date/Time:  Thursday October 09 2015 11:06:37 EDT Ventricular Rate:  90 PR Interval:  189 QRS Duration: 81 QT  Interval:  368 QTC Calculation: 450 R Axis:   -60 Text Interpretation:  Sinus rhythm Inferior infarct, old Anteroseptal  infarct, age indeterminate Baseline wander in lead(s) V2 since last  tracing no significant change compared to EKG from 02/12/15 Confirmed by  Taliaferro ([id number]) on 10/09/2015 11:27:20 AM      MDM   Final diagnoses:  Rapid atrial fibrillation [facility])    Patient presents with atrial fibrillation with RVR. She initially was given Cardizem 20 mg IV bolus. Her heart rate slowed down into the 140s but she maintained atrial fib with RVR. I consulted with Dr. Stanford Breed with cardiology who recommended cardioversion. She's been on Eliquis consistently and states she hasn't missed any doses.  She was cardioverted in the ED with 1 shock. She has maintained a sinus rhythm since that time. She denies any ongoing chest pain. She had a mild bump in her troponin which I feel was rate related. She hasn't had any chest pain or shortness of breath since we initially slowed her heart rate down into the 140s. She has a normal neurologic exam following the cardioversion. I encouraged her to follow-up with her cardiologist within the next few days. Return precautions were given.  CHADSVASC score: 4  CRITICAL CARE Performed by: Dyan Creelman Total critical care time: 45 minutes Critical care time was exclusive of separately billable procedures and treating other patients. Critical care was necessary to treat or prevent imminent or life-threatening deterioration. Critical care was time spent personally by me on the following activities: development of treatment plan with patient and/or surrogate as well as nursing, discussions with consultants, evaluation of patient's response to treatment, examination of patient, obtaining history from patient or surrogate, ordering and performing treatments and interventions, ordering and review of laboratory studies, ordering and review of radiographic  studies, pulse oximetry and re-evaluation of patient's condition.     Malvin Johns, MD 10/09/15 [place], MD 10/09/15 [phone]

## 2015-10-09 NOTE — ED Notes (Signed)
EKG completed given to EDP.  

## 2015-10-13 ENCOUNTER — Encounter: Payer: Self-pay | Admitting: Internal Medicine

## 2015-10-13 ENCOUNTER — Encounter: Payer: Self-pay | Admitting: Cardiology

## 2015-10-13 ENCOUNTER — Ambulatory Visit (INDEPENDENT_AMBULATORY_CARE_PROVIDER_SITE_OTHER): Payer: Commercial Managed Care - HMO | Admitting: Cardiology

## 2015-10-13 VITALS — BP 146/88 | HR 104 | Ht 64.0 in | Wt 178.0 lb

## 2015-10-13 DIAGNOSIS — E785 Hyperlipidemia, unspecified: Secondary | ICD-10-CM | POA: Diagnosis not present

## 2015-10-13 DIAGNOSIS — I480 Paroxysmal atrial fibrillation: Secondary | ICD-10-CM

## 2015-10-13 DIAGNOSIS — I2510 Atherosclerotic heart disease of native coronary artery without angina pectoris: Secondary | ICD-10-CM | POA: Diagnosis not present

## 2015-10-13 DIAGNOSIS — I10 Essential (primary) hypertension: Secondary | ICD-10-CM | POA: Diagnosis not present

## 2015-10-13 HISTORY — DX: Hyperlipidemia, unspecified: E78.5

## 2015-10-13 LAB — TSH: TSH: 0.8 m[IU]/L

## 2015-10-13 MED ORDER — METOPROLOL SUCCINATE ER 25 MG PO TB24: 75.0000 mg | ORAL_TABLET | Freq: Every day | ORAL | Status: DC

## 2015-10-13 NOTE — Patient Instructions (Signed)
Medication Instructions:  1) INCREASE TOPROL to 75 mg daily.  Labwork: Your physician recommends that you return for FASTING lab work.   Testing/Procedures: None  Follow-Up: Your physician wants you to follow-up in: 6 months with Dr. Radford Pax. You will receive a reminder letter in the mail two months in advance. If you don't receive a letter, please call our office to schedule the follow-up appointment.   Any Other Special Instructions Will Be Listed Below (If Applicable).     If you need a refill on your cardiac medications before your next appointment, please call your pharmacy.

## 2015-10-13 NOTE — Addendum Note (Signed)
Addended by: Freada Bergeron on: 10/13/2015 05:44 PM   Modules accepted: Orders

## 2015-10-13 NOTE — Progress Notes (Signed)
Cardiology Office Note    Date:  10/13/2015   ID:  Judith [name], DOB 11/20/1950, MRN [id number]  PCP:  Reginia Forts, MD  Cardiologist:  Fransico Him, MD   Chief Complaint  Patient presents with  . Coronary Artery Disease  . Hypertension  . Atrial Fibrillation    History of Present Illness:  Judith [name] is a 65 y.o. female with a hx of CAD, diabetes, HTN, tobacco abuse. She was admitted 10/5-10/6 with chest pain in the setting of UTI and viral gastroenteritis complicated by AKI and hypoNa. Marland Kitchen Echo demonstrated mod LVH, EF 60-65%, no RWMA, Gr 1 DD, MAC. Stress test was done 02/21/15 and demonstrated EF 60% with inf-lat and apical lateral ischemia (intermediate risk).  She underwent a cardiac cath on 02/27/15 which demonstrated 2 v CAD with CTO of the RCA and high grade bifurcational LCx/OM stenosis. She was brought back for PCI and underwent DES x 2 to the LCx/OM. She presented 04/29/2015 with A fib RVR and had improvement in ventricular response with IV Cardizem in ED.She converted to NSR and was Started on Eliquis 5mg  for anticoagulation for CHA2DS2-VASc Score and unadjusted Ischemic Stroke Rate (% per year) is equal to 4.8 % stroke rate/year from a score of 4. Since her stents were placed in October, Aspirin was stopped and continued on Plavix and started on Eliquis 5 mg twice a day. She did mention that she's had some guaiac positive stools recently and she is schedule to see Dr. Carlean Purl Feb 2017. Given initiation of anticoagulation, the patient was seen by GI and felt no overt melena or bleeding per rectum. However prefered to schedule the EGD and colonoscopy as out patient when safe for patient to hold anticoagulation and Plavix for a few days given lack of overt GI bleed and symptoms. Given her PCI was 02/27/2015 with DES she will need to hold off GI evaluation until at least February and preferably May (9 months out). Recent 2D echo with normal LVF. She  was diagnosed with hyperthyroidism and underwent RAI treatment 8 weeks ago.  She is followed by Dr. Loanne Drilling.  [name] for  follow-up.  She was in the ER on Thursday with recurrent afib and was given IV Cardizem with no improvement and underwent DCCV to NSR.  She is currently walking about a mile a day denies any chest pain, shortness of breath, orthopnea, dizziness, PND,abdominal pain, hematochezia, melena, lower extremity edema.  She is complaining of calf pain of with ambulation.  She also is complaining of increased indigestion despite protonix daily.    Past Medical History  Diagnosis Date  . Hypertension   . Goiter   . Chronic kidney disease   . History of shingles 06/01/2013  . Abnormal EKG 07/31/2013  . GERD (gastroesophageal reflux disease)   . Carpal tunnel syndrome, bilateral   . Coronary artery disease      2 v CAD with CTO of the RCA and high grade bifurcational LCx/OM stenosis. S/P PCI DES x 2 to the LCx/OM.  Marland Kitchen Hyperlipidemia   . Asthma   . Pneumonia ~ 1976  . Type II diabetes mellitus (HCC)     insulin dependent  . Arthritis     "hands" (03/06/2015)  . Diabetic peripheral neuropathy ([place]) "since 1996"  . Carpal tunnel syndrome, bilateral   . Tremors of nervous system   . PAF (paroxysmal atrial fibrillation) ([place]) 04/29/2015    CHADS2VASC score of 4 now on Apixaban  . Hyperlipidemia LDL goal <70  10/13/2015    Past Surgical History  Procedure Laterality Date  . Tonsillectomy  1976  . Cesarean section  1982; 1984  . Carpal tunnel release Right Nov 2015  . Knee arthroscopy Right ~ 2003    "meniscus repair"  . Shoulder open rotator cuff repair Right 1996; 1998    "w/fracture repair"  . Foot neuroma surgery Bilateral 2000  . Carpal tunnel release Right 1992; 05/2014    [name]; [place]  . Thyroid surgery  2000    "removed lots of nodules"  . Cardiac catheterization N/A 02/27/2015    Procedure: Left Heart Cath and Coronary Angiography;  Surgeon: [name], MD; LAD 40%, mCFX 80%, OM 70%, RCA 100% calcified       . Cardiac catheterization N/A 03/06/2015    Procedure: Coronary Stent Intervention;  Surgeon: [name], MD;  Location: [facility];  Service: Cardiovascular;  Laterality: N/A;  Mid CX 3.50x12 promus DES w/ 0% resdual and Prox OM1 2.50x20 promus DES w/ 20% residual  . Abdominal hysterectomy  1988    age 57; CERVICAL DYSPLASIA; ovaries intact.     Current Medications: Outpatient Prescriptions Prior to Visit  Medication Sig Dispense Refill  . albuterol (PROVENTIL HFA) 108 (90 BASE) MCG/ACT inhaler Inhale into the lungs every 6 (six) hours as needed for wheezing or shortness of breath.    Marland Kitchen apixaban (ELIQUIS) 5 MG TABS tablet Take 1 tablet (5 mg total) by mouth 2 (two) times daily. 60 tablet 11  . atorvastatin (LIPITOR) 20 MG tablet Take 1 tablet (20 mg total) by mouth daily. 90 tablet 1  . clopidogrel (PLAVIX) 75 MG tablet Take 1 tablet (75 mg total) by mouth daily. 30 tablet 11  . cyclobenzaprine (FLEXERIL) 5 MG tablet Take 1 tablet (5 mg total) by mouth 3 (three) times daily as needed for muscle spasms. 30 tablet 2  . diltiazem (CARDIZEM CD) 240 MG 24 hr capsule Take 1 capsule (240 mg total) by mouth daily. 30 capsule 11  . FLUoxetine (PROZAC) 20 MG capsule TAKE ONE CAPSULE BY MOUTH ONCE DAILY 90 capsule 0  . gabapentin (NEURONTIN) 300 MG capsule Take 600-900 mg by mouth 3 (three) times daily. Patient take 2 capsules by mouth in morning and three capsules by mouth at bedtime.    Marland Kitchen glipiZIDE (GLUCOTROL XL) 5 MG 24 hr tablet Take 1 tablet (5 mg total) by mouth daily with lunch. 90 tablet 1  . Insulin Detemir (LEVEMIR) 100 UNIT/ML Pen Inject 36 Units into the skin daily at 10 pm. 15 mL 11  . insulin lispro (HUMALOG) 100 UNIT/ML injection Inject 0.05-0.1 mLs (5-10 Units total) into the skin 3 (three) times daily before meals. 10 mL 2  . Insulin Pen Needle 32G X 6 MM MISC Use as directed 50 each 5  . losartan (COZAAR) 50 MG  tablet Take 1 tablet (50 mg total) by mouth daily. 30 tablet 11  . methimazole (TAPAZOLE) 10 MG tablet Take 2 tablets (20 mg total) by mouth 2 (two) times daily. 120 tablet 1  . nitroGLYCERIN (NITROSTAT) 0.4 MG SL tablet Place 1 tablet (0.4 mg total) under the tongue every 5 (five) minutes as needed for chest pain. 25 tablet 3  . ondansetron (ZOFRAN) 4 MG tablet Take 1 tablet (4 mg total) by mouth every 8 (eight) hours as needed for nausea or vomiting. 20 tablet 0  . pantoprazole (PROTONIX) 40 MG tablet Take 1 tablet (40 mg total) by mouth daily. 30 tablet 11  .  triamcinolone cream (KENALOG) 0.1 % Apply 1 application topically 2 (two) times daily. May mix 1:1 with eurerin cream at home 454 g 0  . metFORMIN (GLUCOPHAGE) 1000 MG tablet TAKE ONE TABLET BY MOUTH TWICE DAILY WITH MEALS (Patient taking differently: Take 1,000 mg by mouth 2 (two) times daily with a meal. ) 180 tablet 0  . metoprolol succinate (TOPROL-XL) 50 MG 24 hr tablet Take 1 tablet (50 mg total) by mouth daily. Take with or immediately following a meal. 30 tablet 11  . traMADol (ULTRAM) 50 MG tablet Take 1-2 tablets qhs PRN neuropathy (Patient taking differently: Take 50-100 mg by mouth at bedtime as needed (neuropathy). ) 60 tablet 5  . buPROPion (WELLBUTRIN SR) 150 MG 12 hr tablet TAKE ONE TABLET BY MOUTH TWICE DAILY (Patient taking differently: Take 150 mg by mouth 2 (two) times daily. ) 180 tablet 0   No facility-administered medications prior to visit.     Allergies:   Novolog; Codeine; Iodine; Demerol; Ace inhibitors; Neosporin; Penicillins; Percocet; and Tape   Social History   Social History  . Marital Status: Divorced    Spouse Name: N/A  . Number of Children: 2  . Years of Education: Masters   Occupational History  . Food Therapist, nutritional    Social History Main Topics  . Smoking status: Former Smoker -- 0.00 packs/day for 41 years    Types: Cigarettes  . Smokeless tobacco: Never Used     Comment: 04/29/2015  "quit smoking cigarettes 02/27/2015"  . Alcohol Use: No  . Drug Use: No  . Sexual Activity: Not Currently    Birth Control/ Protection: Post-menopausal, Surgical   Other Topics Concern  . 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      Lives with sister in [place].      Tobacco: 1 ppd x 41 years - quit 2016      Alcohol: none      Drugs: none      Exercise:  Walking in neighborhood; physical job.   Right-handed.   2 cups caffeine daily.        Family History:  The patient's family history includes Allergies in her sister; Breast cancer in her maternal grandmother and mother; COPD in her father; Cancer (age of onset: 81) in her mother; Emphysema in her maternal grandfather and paternal grandfather; Heart attack in her father; Heart disease (age of onset: 70) in her father; Hypertension in her father; Leukemia in her paternal grandmother; Lung cancer in her mother; Parkinson's disease in her father. There is no history of Thyroid disease.   ROS:   Please see the history of present illness.    ROS All other systems reviewed and are negative.   PHYSICAL EXAM:   VS:  BP 146/88 mmHg  Pulse 104  Ht 5\' 4"  (1.626 m)  Wt 178 lb (80.74 kg)  BMI 30.54 kg/m2  SpO2 95%   GEN: Well nourished, well developed, in no acute distress HEENT: normal Neck: no JVD, carotid bruits, or masses Cardiac: RRR; no murmurs, rubs, or gallops,no edema.  Intact distal pulses bilaterally.  Respiratory:  clear to auscultation bilaterally, normal work of breathing GI: soft, nontender, nondistended, + BS MS: no deformity or atrophy Skin: warm and dry, no  rash Neuro:  Alert and Oriented x 3, Strength and sensation are intact Psych: euthymic mood, full affect  Wt Readings from Last 3  Encounters:  10/13/15 178 lb (80.74 kg)  10/09/15 179 lb (81.194 kg)  08/21/15 177 lb (80.287 kg)      Studies/Labs Reviewed:   EKG:  EKG is  ordered today and shows NSR at 97bpm with anterior infarct and no ST changes  Recent Labs: 04/29/2015: B Natriuretic Peptide 150.7* 05/30/2015: TSH 0.291* 08/21/2015: ALT 19 10/09/2015: BUN 16; Creatinine, Ser 0.84; Hemoglobin 13.2; Platelets 429*; Potassium 4.9; Sodium 134*   Lipid Panel    Component Value Date/Time   CHOL 171 05/30/2015 1249   TRIG 334* 05/30/2015 1249   HDL 39* 05/30/2015 1249   CHOLHDL 4.4 05/30/2015 1249   VLDL 67* 05/30/2015 1249   LDLCALC 65 05/30/2015 1249    Additional studies/ records that were reviewed today include:  none    ASSESSMENT:    1. Coronary artery disease involving native coronary artery of native heart without angina pectoris   2. Essential hypertension   3. PAF (paroxysmal atrial fibrillation) ([place])   4. Hyperlipidemia LDL goal <70      PLAN:  In order of problems listed above:  1. ASCAD with no angina.  Continue statin/Plavix/BB.  No ASA due to NOAC. 2. HTN - BP borderline controlled on current medical regimen.  Continue BB/CCB/ARB.  Increase Toprol to 75mg  daily.  3. PAF - maintaining NSR after recent  DCCV.  Continue Apixaban.  Continue CCB and BB for rate control.  Will increase Toprol to 75mg  daily.  I will check a TSH since she has had breakthrough of afib and recently had RAI treatment for her hyperthyroidism. 4. Hyperlipidemia with LDL goal < 70.  Continue statin. Check FLP and ALT in 1 month. Last LDL 65. 5. GI bleeding - she has not had any further episodes of bleeding.  She was cleared to get her colonoscopy last month being 6 months post PCI but now will have to wait another 4 weeks as she just underwent DCCV last week and cannot come off Eliquis for procedure for 4 weeks.  6. GERD - she has had increased indigestion so will increase Protonix to 40mg  BID.    Medication  Adjustments/Labs and Tests Ordered: Current medicines are reviewed at length with the patient today.  Concerns regarding medicines are outlined above.  Medication changes, Labs and Tests ordered today are listed in the Patient Instructions below.  Patient Instructions  Medication Instructions:  1) INCREASE TOPROL to 75 mg daily.  Labwork: Your physician recommends that you return for FASTING lab work.   Testing/Procedures: None  Follow-Up: Your physician wants you to follow-up in: 6 months with Dr. Radford Pax. You will receive a reminder letter in the mail two months in advance. If you don't receive a letter, please call our office to schedule the follow-up appointment.   Any Other Special Instructions Will Be Listed Below (If Applicable).     If you need a refill on your cardiac medications before your next appointment, please call your pharmacy.       Signed, Fransico Him, MD  10/13/2015 9:35 AM    [address], [place], [place]  [address] Phone: [phone]; Fax: [phone]

## 2015-10-15 ENCOUNTER — Encounter: Payer: Self-pay | Admitting: Endocrinology

## 2015-10-15 ENCOUNTER — Ambulatory Visit (INDEPENDENT_AMBULATORY_CARE_PROVIDER_SITE_OTHER): Payer: Commercial Managed Care - HMO | Admitting: Endocrinology

## 2015-10-15 VITALS — BP 136/84 | HR 108 | Ht 64.0 in | Wt 181.0 lb

## 2015-10-15 DIAGNOSIS — E119 Type 2 diabetes mellitus without complications: Secondary | ICD-10-CM

## 2015-10-15 DIAGNOSIS — E0801 Diabetes mellitus due to underlying condition with hyperosmolarity with coma: Secondary | ICD-10-CM

## 2015-10-15 DIAGNOSIS — Z794 Long term (current) use of insulin: Secondary | ICD-10-CM | POA: Diagnosis not present

## 2015-10-15 LAB — POCT GLYCOSYLATED HEMOGLOBIN (HGB A1C): Hemoglobin A1C: 9.2

## 2015-10-15 MED ORDER — ACCU-CHEK AVIVA PLUS W/DEVICE KIT: 1.0000 | PACK | Freq: Every day | Status: DC

## 2015-10-15 MED ORDER — BASAGLAR KWIKPEN 100 UNIT/ML ~~LOC~~ SOPN: 10.0000 [IU] | PEN_INJECTOR | Freq: Every day | SUBCUTANEOUS | Status: DC

## 2015-10-15 MED ORDER — INSULIN LISPRO 100 UNIT/ML ~~LOC~~ SOLN: 20.0000 [IU] | Freq: Three times a day (TID) | SUBCUTANEOUS | Status: DC

## 2015-10-15 MED ORDER — METHIMAZOLE 10 MG PO TABS: 10.0000 mg | ORAL_TABLET | Freq: Two times a day (BID) | ORAL | Status: DC

## 2015-10-15 NOTE — Patient Instructions (Addendum)
Please reduce the methimazole to 1 pill, twice a day. Please come back for a follow-up appointment in 6 weeks if ever you have fever while taking methimazole, stop it and call us, even if the reason is obvious, because of the risk of a rare side-effect. good diet and exercise significantly improve the control of your diabetes.  please let me know if you wish to be referred to a dietician.  high blood sugar is very risky to your health.  you should see an eye doctor and dentist every year.  It is very important to get all recommended vaccinations.  controlling your blood pressure and cholesterol drastically reduces the damage diabetes does to your body.  Those who smoke should quit.  please discuss these with your doctor.  check your blood sugar twice a day.  vary the time of day when you check, between before the 3 meals, and at bedtime.  also check if you have symptoms of your blood sugar being too high or too low.  please keep a record of the readings and bring it to your next appointment here (or you can bring the meter itself).  You can write it on any piece of paper.  please call us sooner if your blood sugar goes below 70, or if you have a lot of readings over 200.  Please stop taking the glipizide, and: change the levemir to "basaglar," 10 units at bedtime, and: Increase the humalog to 20 units 3 times a day (just before each meal). Please come back for a follow-up appointment in 3 months Please call us next week, to tell us how the blood sugar is doing.

## 2015-10-15 NOTE — Progress Notes (Signed)
Subjective:    Patient ID: Judith [name], female    DOB: 09/28/1950, 65 y.o.   MRN: [id number]  HPI  Pt returns for f/u of hyperthyroidism (pt says she took synthroid for a brief time in the 1990's, in an attempt to shrink a goiter; slightly suppressed TSH was first noted in late 2016, when she was in the hospital for new-onset AF; she converted back to SR; nuc med scan showed heterogeneous uptake  neck scar is from C-spine procedure; she has not had dedicated thyroid imaging since the 1990's). she takes tapazole as rx'ed. Pt also requests rx for DM: DM type: Insulin-requiring type 2 Dx'ed: 0000000 Complications: polyneuropathy, nephropathy, and CAD Therapy: insulin since 1997 GDM: never DKA: never Severe hypoglycemia: never Pancreatitis: never Other: she takes multiple daily injections.  Interval history: she has frequent nocturnal hypoglycemia.  No LOC.  no cbg record, but states cbg's are often in the 200's during the day.  Past Medical History  Diagnosis Date  . Hypertension   . Goiter   . Chronic kidney disease   . History of shingles 06/01/2013  . Abnormal EKG 07/31/2013  . GERD (gastroesophageal reflux disease)   . Carpal tunnel syndrome, bilateral   . Coronary artery disease      2 v CAD with CTO of the RCA and high grade bifurcational LCx/OM stenosis. S/P PCI DES x 2 to the LCx/OM.  Marland Kitchen Hyperlipidemia   . Asthma   . Pneumonia ~ 1976  . Type II diabetes mellitus (HCC)     insulin dependent  . Arthritis     "hands" (03/06/2015)  . Diabetic peripheral neuropathy ([place]) "since 1996"  . Carpal tunnel syndrome, bilateral   . Tremors of nervous system   . PAF (paroxysmal atrial fibrillation) ([place]) 04/29/2015    CHADS2VASC score of 4 now on Apixaban  . Hyperlipidemia LDL goal <70 10/13/2015    Past Surgical History  Procedure Laterality Date  . Tonsillectomy  1976  . Cesarean section  1982; 1984  . Carpal tunnel release Right Nov 2015  . Knee arthroscopy Right ~ 2003   "meniscus repair"  . Shoulder open rotator cuff repair Right 1996; 1998    "w/fracture repair"  . Foot neuroma surgery Bilateral 2000  . Carpal tunnel release Right 1992; 05/2014    [name]; [place]  . Thyroid surgery  2000    "removed lots of nodules"  . Cardiac catheterization N/A 02/27/2015    Procedure: Left Heart Cath and Coronary Angiography;  Surgeon: [name], MD; LAD 40%, mCFX 80%, OM 70%, RCA 100% calcified       . Cardiac catheterization N/A 03/06/2015    Procedure: Coronary Stent Intervention;  Surgeon: [name], MD;  Location: [facility];  Service: Cardiovascular;  Laterality: N/A;  Mid CX 3.50x12 promus DES w/ 0% resdual and Prox OM1 2.50x20 promus DES w/ 20% residual  . Abdominal hysterectomy  1988    age 5; CERVICAL DYSPLASIA; ovaries intact.     Social History   Social History  . Marital Status: Divorced    Spouse Name: N/A  . Number of Children: 2  . Years of Education: Masters   Occupational History  . Food Therapist, nutritional    Social History Main Topics  . Smoking status: Former Smoker -- 0.00 packs/day for 41 years    Types: Cigarettes  . Smokeless tobacco: Never Used     Comment: 04/29/2015 "quit smoking cigarettes 02/27/2015"  . Alcohol Use: No  .  Drug Use: No  . Sexual Activity: Not Currently    Birth Control/ Protection: Post-menopausal, Surgical   Other Topics Concern  . 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      Lives with sister in [place].      Tobacco: 1 ppd x 41 years - quit 2016      Alcohol: none      Drugs: none      Exercise:  Walking in neighborhood; physical job.   Right-handed.   2 cups caffeine daily.       Current Outpatient Prescriptions on File Prior to Visit    Medication Sig Dispense Refill  . albuterol (PROVENTIL HFA) 108 (90 BASE) MCG/ACT inhaler Inhale into the lungs every 6 (six) hours as needed for wheezing or shortness of breath.    Marland Kitchen apixaban (ELIQUIS) 5 MG TABS tablet Take 1 tablet (5 mg total) by mouth 2 (two) times daily. 60 tablet 11  . atorvastatin (LIPITOR) 20 MG tablet Take 1 tablet (20 mg total) by mouth daily. 90 tablet 1  . buPROPion (WELLBUTRIN SR) 150 MG 12 hr tablet Take 150 mg by mouth 2 (two) times daily.    . clopidogrel (PLAVIX) 75 MG tablet Take 1 tablet (75 mg total) by mouth daily. 30 tablet 11  . cyclobenzaprine (FLEXERIL) 5 MG tablet Take 1 tablet (5 mg total) by mouth 3 (three) times daily as needed for muscle spasms. 30 tablet 2  . diltiazem (CARDIZEM CD) 240 MG 24 hr capsule Take 1 capsule (240 mg total) by mouth daily. 30 capsule 11  . FLUoxetine (PROZAC) 20 MG capsule TAKE ONE CAPSULE BY MOUTH ONCE DAILY 90 capsule 0  . gabapentin (NEURONTIN) 300 MG capsule Take 600-900 mg by mouth 3 (three) times daily. Patient take 2 capsules by mouth in morning and three capsules by mouth at bedtime.    . gabapentin (NEURONTIN) 300 MG capsule TAKE 3 CAPSULES BY MOUTH AT LUNCHTIME AND 3 CAPSULES AT BEDTIME 180 capsule 1  . Insulin Pen Needle 32G X 6 MM MISC Use as directed 50 each 5  . losartan (COZAAR) 50 MG tablet Take 1 tablet (50 mg total) by mouth daily. 30 tablet 11  . metFORMIN (GLUCOPHAGE) 1000 MG tablet Take 1,000 mg by mouth 2 (two) times daily with a meal.    . metoprolol succinate (TOPROL-XL) 25 MG 24 hr tablet Take 3 tablets (75 mg total) by mouth daily. Take with or immediately following a meal. (Patient taking differently: Take 100 mg by mouth daily. Take with or immediately following a meal.) 90 tablet 11  . nitroGLYCERIN (NITROSTAT) 0.4 MG SL tablet Place 1 tablet (0.4 mg total) under the tongue every 5 (five) minutes as needed for chest pain. 25 tablet 3  . ondansetron (ZOFRAN) 4 MG tablet Take 1 tablet (4 mg total)  by mouth every 8 (eight) hours as needed for nausea or vomiting. 20 tablet 0  . pantoprazole (PROTONIX) 40 MG tablet Take 1 tablet (40 mg total) by mouth daily. 30 tablet 11  . traMADol (ULTRAM) 50 MG tablet Take by mouth as directed.    . triamcinolone cream (KENALOG) 0.1 % Apply 1 application topically 2 (two) times daily. May mix 1:1 with eurerin cream at  home 454 g 0   No current facility-administered medications on file prior to visit.    Allergies  Allergen Reactions  . Novolog [Insulin Aspart] Shortness Of Breath and Other (See Comments)    "breathing problems"  . Codeine Nausea And Vomiting and Other (See Comments)    HIGH DOSES-SEVERE VOMITING  . Iodine Other (See Comments)    MUST HAVE BENADRYL PRIOR TO PROCEDURE AND RIGHT BEFORE TREATMENT TO COUNTERACT REACTION-BLISTERING REACTION DERMATOLOGICAL  . Demerol [Meperidine] Nausea And Vomiting  . Ace Inhibitors Cough  . Neosporin [Neomycin-Bacitracin Zn-Polymyx] Itching and Rash    MAKES REACTIONS WORSE WHEN USING AS PROPHYLACTIC  . Penicillins Itching, Rash and Other (See Comments)    CHEST SIZED RASH AND ITCHING   . Percocet [Oxycodone-Acetaminophen] Rash  . Tape Itching and Rash    Family History  Problem Relation Age of Onset  . Cancer Mother 57    bronchial cancer  . Hypertension Father   . COPD Father   . Heart disease Father 44    CAD with cardiac stenting  . Heart attack Father   . Allergies Sister   . Breast cancer Maternal Grandmother   . Emphysema Maternal Grandfather   . Leukemia Paternal Grandmother   . Emphysema Paternal Grandfather   . Thyroid disease Neg Hx   . Breast cancer Mother   . Lung cancer Mother   . Parkinson's disease Father     BP 136/84 mmHg  Pulse 108  Ht 5\' 4"  (1.626 m)  Wt 181 lb (82.101 kg)  BMI 31.05 kg/m2  SpO2 95%  Review of Systems she denies hypoglycemia    Objective:   Physical Exam VITAL SIGNS:  See vs page GENERAL: no distress NECK: a healed scar is present.   Several right thyroid nodules are again palpable  Pulses: dorsalis pedis intact bilat.   MSK: no deformity of the feet CV: trace bilat leg edema Skin:  no ulcer on the feet.  normal color and temp on the feet.  Neuro: sensation is intact to touch on the feet, but severely decreased from normal Ext: There is bilateral onychomycosis of the toenails   Lab Results  Component Value Date   CREATININE 0.84 10/09/2015   BUN 16 10/09/2015   NA 134* 10/09/2015   K 4.9 10/09/2015   CL 98* 10/09/2015   CO2 23 10/09/2015   A1c=9.2%  Lab Results  Component Value Date   TSH 0.80 10/13/2015   T4TOTAL 7.8 04/30/2015       Assessment & Plan:  Hyperthyroidism: improved Insulin-requiring type 2, new to me: Based on the pattern of her cbg's, she needs some adjustment in her therapy.    Patient is advised the following: Patient Instructions  Please reduce the methimazole to 1 pill, twice a day. Please come back for a follow-up appointment in 6 weeks if ever you have fever while taking methimazole, stop it and call us, even if the reason is obvious, because of the risk of a rare side-effect. good diet and exercise significantly improve the control of your diabetes.  please let me know if you wish to be referred to a dietician.  high blood sugar is very risky to your health.  you should see an eye doctor and dentist every year.  It is very important to get all recommended vaccinations.  controlling your blood pressure and cholesterol drastically reduces the damage diabetes does to your body.  Those who smoke should quit.  please discuss these with your doctor.  check your blood sugar twice a day.  vary the time of day when you check, between before the 3 meals, and at bedtime.  also check if you have symptoms of your blood sugar being too high or too low.  please keep a record of the readings and bring it to your next appointment here (or you can bring the meter itself).  You can write it on any piece  of paper.  please call us sooner if your blood sugar goes below 70, or if you have a lot of readings over 200.  Please stop taking the glipizide, and: change the levemir to "basaglar," 10 units at bedtime, and: Increase the humalog to 20 units 3 times a day (just before each meal). Please come back for a follow-up appointment in 3 months Please call us next week, to tell us how the blood sugar is doing.    Renato Shin, MD

## 2015-10-19 ENCOUNTER — Other Ambulatory Visit: Payer: Self-pay | Admitting: Family Medicine

## 2015-10-23 ENCOUNTER — Other Ambulatory Visit: Payer: Self-pay

## 2015-10-23 MED ORDER — METFORMIN HCL 1000 MG PO TABS: 1000.0000 mg | ORAL_TABLET | Freq: Two times a day (BID) | ORAL | Status: DC

## 2015-10-31 ENCOUNTER — Other Ambulatory Visit: Payer: Self-pay

## 2015-10-31 ENCOUNTER — Emergency Department (HOSPITAL_COMMUNITY): Payer: Commercial Managed Care - HMO

## 2015-10-31 ENCOUNTER — Emergency Department (HOSPITAL_COMMUNITY)
Admission: EM | Admit: 2015-10-31 | Discharge: 2015-11-01 | Disposition: A | Discharge status: Home / Self Care | Payer: Commercial Managed Care - HMO | Attending: Emergency Medicine | Admitting: Emergency Medicine

## 2015-10-31 ENCOUNTER — Encounter (HOSPITAL_COMMUNITY): Payer: Self-pay

## 2015-10-31 DIAGNOSIS — Z7984 Long term (current) use of oral hypoglycemic drugs: Secondary | ICD-10-CM | POA: Diagnosis not present

## 2015-10-31 DIAGNOSIS — I2510 Atherosclerotic heart disease of native coronary artery without angina pectoris: Secondary | ICD-10-CM | POA: Insufficient documentation

## 2015-10-31 DIAGNOSIS — Z7901 Long term (current) use of anticoagulants: Secondary | ICD-10-CM | POA: Insufficient documentation

## 2015-10-31 DIAGNOSIS — I4891 Unspecified atrial fibrillation: Secondary | ICD-10-CM

## 2015-10-31 DIAGNOSIS — N189 Chronic kidney disease, unspecified: Secondary | ICD-10-CM | POA: Diagnosis not present

## 2015-10-31 DIAGNOSIS — I129 Hypertensive chronic kidney disease with stage 1 through stage 4 chronic kidney disease, or unspecified chronic kidney disease: Secondary | ICD-10-CM | POA: Insufficient documentation

## 2015-10-31 DIAGNOSIS — Z79899 Other long term (current) drug therapy: Secondary | ICD-10-CM | POA: Diagnosis not present

## 2015-10-31 DIAGNOSIS — Z87891 Personal history of nicotine dependence: Secondary | ICD-10-CM | POA: Diagnosis not present

## 2015-10-31 DIAGNOSIS — J45909 Unspecified asthma, uncomplicated: Secondary | ICD-10-CM | POA: Diagnosis not present

## 2015-10-31 DIAGNOSIS — Z794 Long term (current) use of insulin: Secondary | ICD-10-CM | POA: Diagnosis not present

## 2015-10-31 DIAGNOSIS — E1140 Type 2 diabetes mellitus with diabetic neuropathy, unspecified: Secondary | ICD-10-CM | POA: Insufficient documentation

## 2015-10-31 LAB — BASIC METABOLIC PANEL
ANION GAP: 8 (ref 5–15)
BUN: 16 mg/dL (ref 6–20)
CALCIUM: 9.5 mg/dL (ref 8.9–10.3)
CHLORIDE: 103 mmol/L (ref 101–111)
CO2: 26 mmol/L (ref 22–32)
Creatinine, Ser: 0.82 mg/dL (ref 0.44–1.00)
GFR calc non Af Amer: >60 mL/min (ref >60)
GLUCOSE: 300 mg/dL — AB (ref 65–99)
Potassium: 3.8 mmol/L (ref 3.5–5.1)
Sodium: 137 mmol/L (ref 135–145)

## 2015-10-31 LAB — CBC
HEMATOCRIT: 34.5 % — AB (ref 36.0–46.0)
HEMOGLOBIN: 11.1 g/dL — AB (ref 12.0–15.0)
MCH: 27.8 pg (ref 26.0–34.0)
MCHC: 32.2 g/dL (ref 30.0–36.0)
MCV: 86.5 fL (ref 78.0–100.0)
Platelets: 369 10*3/uL (ref 150–400)
RBC: 3.99 MIL/uL (ref 3.87–5.11)
RDW: 13.6 % (ref 11.5–15.5)
WBC: 9.1 10*3/uL (ref 4.0–10.5)

## 2015-10-31 LAB — I-STAT TROPONIN, ED: TROPONIN I, POC: 0.01 ng/mL (ref 0.00–0.08)

## 2015-10-31 MED ORDER — PROPOFOL 10 MG/ML IV BOLUS
INTRAVENOUS | Status: DC | PRN
  Administered 2015-10-31: 40 mg via INTRAVENOUS

## 2015-10-31 MED ORDER — SODIUM CHLORIDE 0.9 % IV BOLUS (SEPSIS)
1000.0000 mL | Freq: Once | INTRAVENOUS | Status: AC
  Administered 2015-10-31: 1000 mL via INTRAVENOUS

## 2015-10-31 MED ORDER — PROPOFOL 10 MG/ML IV BOLUS
0.5000 mg/kg | Freq: Once | INTRAVENOUS | Status: AC
  Administered 2015-10-31: 40 mg via INTRAVENOUS
  Filled 2015-10-31: qty 20

## 2015-10-31 MED ORDER — IPRATROPIUM-ALBUTEROL 0.5-2.5 (3) MG/3ML IN SOLN
3.0000 mL | Freq: Once | RESPIRATORY_TRACT | Status: AC
Start: 2015-11-01 — End: 2015-11-01
  Administered 2015-11-01: 3 mL via RESPIRATORY_TRACT
  Filled 2015-10-31: qty 3

## 2015-10-31 NOTE — ED Notes (Signed)
Pt arrived via [facility] c/o atrial fibrillation, central chest pain radiating into neck, sob.  Hx: Cardioversion.  EMS gave 10mg  Cardizem x2 doses.  324 ASA.  1 nitro SL bringing BP down to 88/40.

## 2015-10-31 NOTE — ED Provider Notes (Signed)
CSN: [id number]     Arrival date & time 10/31/15  2100 History   First MD Initiated Contact with Patient 10/31/15 2113     Chief Complaint  Patient presents with  . Atrial Fibrillation  . Chest Pain  . Shortness of Breath     (Consider location/radiation/quality/duration/timing/severity/associated sxs/prior Treatment) HPI  65 year old female presents with chest tightness, palpitations, dyspnea, heart racing since 5pm. Feels similar to prior times a day during in A. fib. On 6/1 she was seen here for the same and cardioverted electrically. She is asking for [place] to do this again. She states that she can feel herself go in and out of A. fib. She is on Eliquis and has not missed any doses. She has been taking her A. fib medicines as prescribed. No leg swelling. Follows with Dr. Radford Pax of cardiology. Has history of prior coronary stents but states she never really had pain when she was diagnosed with these.  Past Medical History  Diagnosis Date  . Hypertension   . Goiter   . Chronic kidney disease   . History of shingles 06/01/2013  . Abnormal EKG 07/31/2013  . GERD (gastroesophageal reflux disease)   . Carpal tunnel syndrome, bilateral   . Coronary artery disease      2 v CAD with CTO of the RCA and high grade bifurcational LCx/OM stenosis. S/P PCI DES x 2 to the LCx/OM.  Marland Kitchen Hyperlipidemia   . Asthma   . Pneumonia ~ 1976  . Type II diabetes mellitus (HCC)     insulin dependent  . Arthritis     "hands" (03/06/2015)  . Diabetic peripheral neuropathy ([place]) "since 1996"  . Carpal tunnel syndrome, bilateral   . Tremors of nervous system   . PAF (paroxysmal atrial fibrillation) ([place]) 04/29/2015    CHADS2VASC score of 4 now on Apixaban  . Hyperlipidemia LDL goal <70 10/13/2015   Past Surgical History  Procedure Laterality Date  . Tonsillectomy  1976  . Cesarean section  1982; 1984  . Carpal tunnel release Right Nov 2015  . Knee arthroscopy Right ~ 2003    "meniscus repair"  . Shoulder  open rotator cuff repair Right 1996; 1998    "w/fracture repair"  . Foot neuroma surgery Bilateral 2000  . Carpal tunnel release Right 1992; 05/2014    [name]; [place]  . Thyroid surgery  2000    "removed lots of nodules"  . Cardiac catheterization N/A 02/27/2015    Procedure: Left Heart Cath and Coronary Angiography;  Surgeon: [name], MD; LAD 40%, mCFX 80%, OM 70%, RCA 100% calcified       . Cardiac catheterization N/A 03/06/2015    Procedure: Coronary Stent Intervention;  Surgeon: [name], MD;  Location: [facility];  Service: Cardiovascular;  Laterality: N/A;  Mid CX 3.50x12 promus DES w/ 0% resdual and Prox OM1 2.50x20 promus DES w/ 20% residual  . Abdominal hysterectomy  1988    age 66; CERVICAL DYSPLASIA; ovaries intact.    Family History  Problem Relation Age of Onset  . Cancer Mother 31    bronchial cancer  . Hypertension Father   . COPD Father   . Heart disease Father 3    CAD with cardiac stenting  . Heart attack Father   . Allergies Sister   . Breast cancer Maternal Grandmother   . Emphysema Maternal Grandfather   . Leukemia Paternal Grandmother   . Emphysema Paternal Grandfather   . Thyroid disease Neg Hx   .  Breast cancer Mother   . Lung cancer Mother   . Parkinson's disease Father    Social History  Substance Use Topics  . Smoking status: Former Smoker -- 0.00 packs/day for 41 years    Types: Cigarettes  . Smokeless tobacco: Never Used     Comment: 04/29/2015 "quit smoking cigarettes 02/27/2015"  . Alcohol Use: No   OB History    No data available     Review of Systems  Constitutional: Negative for fever.  Respiratory: Positive for chest tightness and shortness of breath.   Cardiovascular: Positive for palpitations. Negative for leg swelling.  Gastrointestinal: Negative for vomiting and abdominal pain.  All other systems reviewed and are negative.     Allergies  Novolog; Codeine; Iodine; Demerol; Ace inhibitors;  Neosporin; Penicillins; Percocet; and Tape  Home Medications   Prior to Admission medications   Medication Sig Start Date End Date Taking? Authorizing Provider  albuterol (PROVENTIL HFA) 108 (90 BASE) MCG/ACT inhaler Inhale into the lungs every 6 (six) hours as needed for wheezing or shortness of breath.    Historical Provider, MD  apixaban (ELIQUIS) 5 MG TABS tablet Take 1 tablet (5 mg total) by mouth 2 (two) times daily. 05/01/15   Bhavinkumar Bhagat, PA  atorvastatin (LIPITOR) 20 MG tablet Take 1 tablet (20 mg total) by mouth daily. 04/22/15   Liliane Shi, PA-C  Blood Glucose Monitoring Suppl (ACCU-CHEK AVIVA PLUS) w/Device KIT 1 Device by Does not apply route daily. 10/15/15   Renato Shin, MD  buPROPion (WELLBUTRIN SR) 150 MG 12 hr tablet Take 150 mg by mouth 2 (two) times daily.    Historical Provider, MD  clopidogrel (PLAVIX) 75 MG tablet Take 1 tablet (75 mg total) by mouth daily. 02/27/15   [name], MD  cyclobenzaprine (FLEXERIL) 5 MG tablet Take 1 tablet (5 mg total) by mouth 3 (three) times daily as needed for muscle spasms. 07/31/15   [name], MD  diltiazem (CARDIZEM CD) 240 MG 24 hr capsule Take 1 capsule (240 mg total) by mouth daily. 05/01/15   Bhavinkumar Bhagat, PA  FLUoxetine (PROZAC) 20 MG capsule TAKE ONE CAPSULE BY MOUTH ONCE DAILY 08/29/15   [name], MD  gabapentin (NEURONTIN) 300 MG capsule Take 600-900 mg by mouth 3 (three) times daily. Patient take 2 capsules by mouth in morning and three capsules by mouth at bedtime.    Historical Provider, MD  gabapentin (NEURONTIN) 300 MG capsule TAKE 3 CAPSULES BY MOUTH AT LUNCHTIME AND 3 CAPSULES AT BEDTIME 10/13/15   [name], MD  Insulin Glargine (BASAGLAR KWIKPEN) 100 UNIT/ML SOPN Inject 0.1 mLs (10 Units total) into the skin at bedtime. 10/15/15   Renato Shin, MD  insulin lispro (HUMALOG) 100 UNIT/ML injection Inject 0.2 mLs (20 Units total) into the skin 3 (three) times daily with meals. 10/15/15   Renato Shin, MD  Insulin Pen Needle 32G X 6 MM MISC Use as directed 09/28/14   Barton Fanny, MD  losartan (COZAAR) 50 MG tablet Take 1 tablet (50 mg total) by mouth daily. 03/13/15   Liliane Shi, PA-C  metFORMIN (GLUCOPHAGE) 1000 MG tablet Take 1 tablet (1,000 mg total) by mouth 2 (two) times daily with a meal. 10/23/15   Renato Shin, MD  methimazole (TAPAZOLE) 10 MG tablet Take 1 tablet (10 mg total) by mouth 2 (two) times daily. 10/15/15   Renato Shin, MD  metoprolol succinate (TOPROL-XL) 25 MG 24 hr tablet Take 3 tablets (75 mg  total) by mouth daily. Take with or immediately following a meal. Patient taking differently: Take 100 mg by mouth daily. Take with or immediately following a meal. 10/13/15   Sueanne Margarita, MD  nitroGLYCERIN (NITROSTAT) 0.4 MG SL tablet Place 1 tablet (0.4 mg total) under the tongue every 5 (five) minutes as needed for chest pain. 02/25/15   Liliane Shi, PA-C  ondansetron (ZOFRAN) 4 MG tablet Take 1 tablet (4 mg total) by mouth every 8 (eight) hours as needed for nausea or vomiting. 04/13/15   [name], PA-C  pantoprazole (PROTONIX) 40 MG tablet Take 1 tablet (40 mg total) by mouth daily. 03/07/15   Evelene Croon Barrett, PA-C  traMADol (ULTRAM) 50 MG tablet Take by mouth as directed.    Historical Provider, MD  triamcinolone cream (KENALOG) 0.1 % Apply 1 application topically 2 (two) times daily. May mix 1:1 with eurerin cream at home 04/02/14   [name], PA-C   BP 159/137 mmHg  Pulse 135  Temp(Src) 98.2 F (36.8 C) (Oral)  Resp 18  Ht _0  (1.626 m)  Wt 178 lb (80.74 kg)  BMI 30.54 kg/m2  SpO2 96% Physical Exam  Constitutional: She is oriented to person, place, and time. She appears well-developed and well-nourished. No distress.  HENT:  Head: Normocephalic and atraumatic.  Right Ear: External ear normal.  Left Ear: External ear normal.  Nose: Nose normal.  Eyes: Right eye exhibits no discharge. Left eye exhibits no discharge.  Cardiovascular:  Normal heart sounds.  An irregularly irregular rhythm present. Tachycardia present.   Pulmonary/Chest: Effort normal and breath sounds normal.  Abdominal: Soft. There is no tenderness.  Musculoskeletal: She exhibits no edema.  Neurological: She is alert and oriented to person, place, and time.  Skin: Skin is warm and dry. She is not diaphoretic.  Nursing note and vitals reviewed.   ED Course  .Sedation Date/Time: 10/31/2015 11:00 PM Performed by: Sherwood Gambler Authorized by: Sherwood Gambler  Consent:    Consent obtained:  Verbal and written   Consent given by:  Patient   Risks discussed:  Allergic reaction, inadequate sedation, nausea, vomiting, respiratory compromise necessitating ventilatory assistance and intubation, prolonged sedation necessitating reversal and prolonged hypoxia resulting in organ damage Indications:    Sedation purpose:  Cardioversion   Procedure necessitating sedation performed by:  Physician performing sedation   Intended level of sedation:  Moderate (conscious sedation) Pre-sedation assessment:    Time since last food or drink:  3 pm   NPO status caution: urgency dictates proceeding with non-ideal NPO status     ASA classification: class 2 - patient with mild systemic disease     Neck mobility: normal     Mouth opening:  3 or more finger widths   Thyromental distance:  3 finger widths   Mallampati score:  I - soft palate, uvula, fauces, pillars visible   Pre-sedation assessments completed and reviewed: airway patency, cardiovascular function, mental status, nausea/vomiting and respiratory function   Immediate pre-procedure details:    Reassessment: Patient reassessed immediately prior to procedure     Reviewed: vital signs, relevant labs/tests and NPO status     Verified: bag valve mask available, emergency equipment available, intubation equipment available, IV patency confirmed, oxygen available and suction available   Procedure details (see MAR for  exact dosages):    Preoxygenation:  Nasal cannula   Sedation:  Propofol   Intra-procedure monitoring:  Blood pressure monitoring, cardiac monitor and continuous pulse oximetry  Intra-procedure events: respiratory depression     Intra-procedure management:  Airway repositioning and BVM ventilation   Total sedation time (minutes):  11 Post-procedure details:    Attendance: Constant attendance by certified staff until patient recovered     Recovery: Patient returned to pre-procedure baseline     Post-sedation assessments completed and reviewed: airway patency, cardiovascular function, hydration status, mental status, pain level and respiratory function     Patient is stable for discharge or admission: Yes     Patient tolerance:  Tolerated well, no immediate complications Comments:     Brief respiratory depression but no apnea. Never dropped O2 sats below 93%. Briefly bagged to help maintain airway during this time. .Cardioversion Date/Time: 10/31/2015 11:39 PM Performed by: Sherwood Gambler Authorized by: Sherwood Gambler Consent: Verbal consent obtained. Written consent obtained. Risks and benefits: risks, benefits and alternatives were discussed Consent given by: patient Patient identity confirmed: verbally with patient Time out: Immediately prior to procedure a "time out" was called to verify the correct patient, procedure, equipment, support staff and site/side marked as required. Patient sedated: yes Sedatives: propofol and see MAR for details Vitals: Vital signs were monitored during sedation. Cardioversion basis: emergent Pre-procedure rhythm: atrial fibrillation Patient position: patient was placed in a supine position Chest area: chest area exposed Electrodes: pads Electrodes placed: anterior-posterior Number of attempts: 1 Attempt 1 mode: synchronous Attempt 1 waveform: biphasic Attempt 1 shock (in Joules): 200 Attempt 1 outcome: conversion to normal sinus  rhythm Post-procedure rhythm: normal sinus rhythm Complications: no complications Patient tolerance: Patient tolerated the procedure well with no immediate complications   (including critical care time) Labs Review Labs Reviewed  BASIC METABOLIC PANEL - Abnormal; Notable for the following:    Glucose, Bld 300 (*)    All other components within normal limits  CBC - Abnormal; Notable for the following:    Hemoglobin 11.1 (*)    HCT 34.5 (*)    All other components within normal limits  I-STAT TROPOININ, ED    Imaging Review Dg Chest 2 View  10/31/2015  CLINICAL DATA:  A-fib, SOB, chest pain X couple hours Hx: HTN, diabetes, CAD, A-fib, ex-smoker EXAM: CHEST  2 VIEW COMPARISON:  Chest radiograph 10/09/2015 FINDINGS: Normal cardiac silhouette ectatic aorta. Lungs are mildly hyperinflated. No effusion, infiltrate pneumothorax. IMPRESSION: Hyperinflated lungs without acute findings. Electronically Signed   By: Suzy Bouchard M.D.   On: 10/31/2015 22:18   I have personally reviewed and evaluated these images and lab results as part of my medical decision-making.   EKG Interpretation   Date/Time:  Friday October 31 2015 21:09:11 EDT Ventricular Rate:  163 PR Interval:    QRS Duration: 95 QT Interval:  324 QTC Calculation: 544 R Axis:   -46 Text Interpretation:  Atrial fibrillation with rapid V-rate Ventricular  premature complex Left anterior fascicular block Low voltage, extremity  leads Anteroseptal infarct, age indeterminate Repolarization abnormality,  prob rate related Confirmed by Jalayah Gutridge MD, Kaydi Kley [phone]) on 10/31/2015  9:12:23 PM       EKG Interpretation  Date/Time:  Friday October 31 2015 22:51:24 EDT Ventricular Rate:  87 PR Interval:    QRS Duration: 93 QT Interval:  364 QTC Calculation: 438 R Axis:   -9 Text Interpretation:  Sinus rhythm Low voltage, precordial leads Anteroseptal infarct, old Baseline wander in lead(s) V2 V6 Afib resolved, now sinus. No obvious ST/T  changes Confirmed by Regenia Skeeter MD, Ahan Eisenberger [phone]) on 10/31/2015 11:38:59 PM       MDM  Final diagnoses:  Atrial fibrillation with rapid ventricular response [facility])    D/w cardiology, Dr. Rosanna Randy, who agrees with cardioversion. Explained risks/benefits to sedation and cardioversion, patient agrees. NSR after cardioversion. Chest pain is gone. Dr. Rosanna Randy recommends no further workup but f/u with her cardiologist. Prior to discharge patient now complaining of feeling wheezy. Has history of asthma. Some diffuse wheezes but no respiratory distress. Will give albuterol and get repeat CXR. Dr. Leonides Schanz to f/u on albuterol response and CXR findings. If she is back to baseline and CXR negative, d/c home.    Sherwood Gambler, MD 11/01/15 0010

## 2015-11-01 ENCOUNTER — Emergency Department (HOSPITAL_COMMUNITY): Payer: Commercial Managed Care - HMO

## 2015-11-01 NOTE — ED Notes (Signed)
Pt stable, ambulatory, states understanding of discharge instructions 

## 2015-11-01 NOTE — ED Notes (Signed)
Pt ambulating to bathroom.

## 2015-11-01 NOTE — ED Provider Notes (Signed)
2:45 AM  Assumed care from Dr. Regenia Skeeter.  Pt is a 65 y.o. female with history of atrial fibrillation who presented to the emergency department with atrial fibrillation with RVR. Was cardioverted successfully and is feeling better. Patient did have a brief episode of shortness of breath with wheezing around midnight. Was given a DuoNeb and reports feeling much better. Her lungs are clear. Repeat chest x-ray shows no significant change. No edema. She is still normal sinus rhythm without any complaints. Ambulatory, tolerating by mouth. I feel she is safe to be discharged home. Discussed return precautions. She does have a cardiologist for follow-up. She is currently on Eliquis and reports compliance. No current neurologic deficits. No chest pain.  [place], DO 11/01/15 0246

## 2015-11-01 NOTE — Discharge Instructions (Signed)

## 2015-11-07 ENCOUNTER — Other Ambulatory Visit: Payer: Self-pay | Admitting: Family Medicine

## 2015-11-07 MED ORDER — ALBUTEROL SULFATE HFA 108 (90 BASE) MCG/ACT IN AERS: 1.0000 | INHALATION_SPRAY | Freq: Four times a day (QID) | RESPIRATORY_TRACT | Status: DC | PRN

## 2015-11-07 NOTE — Telephone Encounter (Signed)
fax request for Albuterol inhaler from [facility]

## 2015-11-10 ENCOUNTER — Other Ambulatory Visit: Payer: Self-pay

## 2015-11-15 ENCOUNTER — Inpatient Hospital Stay (HOSPITAL_COMMUNITY)
Admission: EM | Admit: 2015-11-15 | Discharge: 2015-11-18 | DRG: 309 | Disposition: A | Discharge status: Home / Self Care | Payer: Managed Care, Other (non HMO) | Attending: Cardiology | Admitting: Cardiology

## 2015-11-15 ENCOUNTER — Emergency Department (HOSPITAL_COMMUNITY): Payer: Managed Care, Other (non HMO)

## 2015-11-15 ENCOUNTER — Encounter (HOSPITAL_COMMUNITY): Payer: Self-pay | Admitting: Emergency Medicine

## 2015-11-15 DIAGNOSIS — E1122 Type 2 diabetes mellitus with diabetic chronic kidney disease: Secondary | ICD-10-CM | POA: Diagnosis present

## 2015-11-15 DIAGNOSIS — E785 Hyperlipidemia, unspecified: Secondary | ICD-10-CM | POA: Diagnosis present

## 2015-11-15 DIAGNOSIS — IMO0001 Reserved for inherently not codable concepts without codable children: Secondary | ICD-10-CM

## 2015-11-15 DIAGNOSIS — Z880 Allergy status to penicillin: Secondary | ICD-10-CM

## 2015-11-15 DIAGNOSIS — Z7984 Long term (current) use of oral hypoglycemic drugs: Secondary | ICD-10-CM

## 2015-11-15 DIAGNOSIS — Z955 Presence of coronary angioplasty implant and graft: Secondary | ICD-10-CM | POA: Diagnosis not present

## 2015-11-15 DIAGNOSIS — I10 Essential (primary) hypertension: Secondary | ICD-10-CM | POA: Diagnosis not present

## 2015-11-15 DIAGNOSIS — E0590 Thyrotoxicosis, unspecified without thyrotoxic crisis or storm: Secondary | ICD-10-CM | POA: Diagnosis present

## 2015-11-15 DIAGNOSIS — I129 Hypertensive chronic kidney disease with stage 1 through stage 4 chronic kidney disease, or unspecified chronic kidney disease: Secondary | ICD-10-CM | POA: Diagnosis present

## 2015-11-15 DIAGNOSIS — Z87891 Personal history of nicotine dependence: Secondary | ICD-10-CM | POA: Diagnosis not present

## 2015-11-15 DIAGNOSIS — Z91041 Radiographic dye allergy status: Secondary | ICD-10-CM | POA: Diagnosis not present

## 2015-11-15 DIAGNOSIS — K219 Gastro-esophageal reflux disease without esophagitis: Secondary | ICD-10-CM | POA: Diagnosis present

## 2015-11-15 DIAGNOSIS — N179 Acute kidney failure, unspecified: Secondary | ICD-10-CM | POA: Diagnosis present

## 2015-11-15 DIAGNOSIS — Z5181 Encounter for therapeutic drug level monitoring: Secondary | ICD-10-CM | POA: Diagnosis not present

## 2015-11-15 DIAGNOSIS — I4891 Unspecified atrial fibrillation: Secondary | ICD-10-CM

## 2015-11-15 DIAGNOSIS — I25118 Atherosclerotic heart disease of native coronary artery with other forms of angina pectoris: Secondary | ICD-10-CM | POA: Diagnosis present

## 2015-11-15 DIAGNOSIS — N189 Chronic kidney disease, unspecified: Secondary | ICD-10-CM | POA: Diagnosis present

## 2015-11-15 DIAGNOSIS — Z888 Allergy status to other drugs, medicaments and biological substances status: Secondary | ICD-10-CM

## 2015-11-15 DIAGNOSIS — Z79899 Other long term (current) drug therapy: Secondary | ICD-10-CM | POA: Diagnosis not present

## 2015-11-15 DIAGNOSIS — I2510 Atherosclerotic heart disease of native coronary artery without angina pectoris: Secondary | ICD-10-CM | POA: Diagnosis present

## 2015-11-15 DIAGNOSIS — Z886 Allergy status to analgesic agent status: Secondary | ICD-10-CM | POA: Diagnosis not present

## 2015-11-15 DIAGNOSIS — Z7902 Long term (current) use of antithrombotics/antiplatelets: Secondary | ICD-10-CM | POA: Diagnosis not present

## 2015-11-15 DIAGNOSIS — E119 Type 2 diabetes mellitus without complications: Secondary | ICD-10-CM

## 2015-11-15 DIAGNOSIS — Z7901 Long term (current) use of anticoagulants: Secondary | ICD-10-CM | POA: Diagnosis not present

## 2015-11-15 DIAGNOSIS — Z794 Long term (current) use of insulin: Secondary | ICD-10-CM

## 2015-11-15 DIAGNOSIS — E0500 Thyrotoxicosis with diffuse goiter without thyrotoxic crisis or storm: Secondary | ICD-10-CM | POA: Diagnosis present

## 2015-11-15 DIAGNOSIS — E1142 Type 2 diabetes mellitus with diabetic polyneuropathy: Secondary | ICD-10-CM | POA: Diagnosis present

## 2015-11-15 DIAGNOSIS — J45909 Unspecified asthma, uncomplicated: Secondary | ICD-10-CM | POA: Diagnosis present

## 2015-11-15 DIAGNOSIS — I480 Paroxysmal atrial fibrillation: Principal | ICD-10-CM | POA: Diagnosis present

## 2015-11-15 LAB — COMPREHENSIVE METABOLIC PANEL
ALBUMIN: 3.1 g/dL — AB (ref 3.5–5.0)
ALT: 14 U/L (ref 14–54)
AST: 16 U/L (ref 15–41)
Alkaline Phosphatase: 128 U/L — ABNORMAL HIGH (ref 38–126)
Anion gap: 12 (ref 5–15)
BUN: 29 mg/dL — AB (ref 6–20)
CHLORIDE: 91 mmol/L — AB (ref 101–111)
CO2: 30 mmol/L (ref 22–32)
CREATININE: 1.35 mg/dL — AB (ref 0.44–1.00)
Calcium: 10.1 mg/dL (ref 8.9–10.3)
GFR calc Af Amer: 47 mL/min — ABNORMAL LOW (ref >60)
GFR, EST NON AFRICAN AMERICAN: 41 mL/min — AB (ref >60)
GLUCOSE: 351 mg/dL — AB (ref 65–99)
POTASSIUM: 4 mmol/L (ref 3.5–5.1)
Sodium: 133 mmol/L — ABNORMAL LOW (ref 135–145)
Total Bilirubin: 0.3 mg/dL (ref 0.3–1.2)
Total Protein: 7.1 g/dL (ref 6.5–8.1)

## 2015-11-15 LAB — BASIC METABOLIC PANEL
ANION GAP: 9 (ref 5–15)
BUN: 27 mg/dL — AB (ref 6–20)
CO2: 32 mmol/L (ref 22–32)
Calcium: 9.8 mg/dL (ref 8.9–10.3)
Chloride: 95 mmol/L — ABNORMAL LOW (ref 101–111)
Creatinine, Ser: 1.06 mg/dL — ABNORMAL HIGH (ref 0.44–1.00)
GFR calc Af Amer: >60 mL/min (ref >60)
GFR, EST NON AFRICAN AMERICAN: 55 mL/min — AB (ref >60)
Glucose, Bld: 296 mg/dL — ABNORMAL HIGH (ref 65–99)
POTASSIUM: 4.3 mmol/L (ref 3.5–5.1)
SODIUM: 136 mmol/L (ref 135–145)

## 2015-11-15 LAB — MAGNESIUM: Magnesium: 1.5 mg/dL — ABNORMAL LOW (ref 1.7–2.4)

## 2015-11-15 LAB — I-STAT TROPONIN, ED: Troponin i, poc: 0 ng/mL (ref 0.00–0.08)

## 2015-11-15 LAB — CBG MONITORING, ED: Glucose-Capillary: 422 mg/dL — ABNORMAL HIGH (ref 65–99)

## 2015-11-15 LAB — I-STAT CHEM 8, ED
BUN: 30 mg/dL — ABNORMAL HIGH (ref 6–20)
CREATININE: 1.3 mg/dL — AB (ref 0.44–1.00)
Calcium, Ion: 1.18 mmol/L (ref 1.12–1.23)
Chloride: 90 mmol/L — ABNORMAL LOW (ref 101–111)
Glucose, Bld: 352 mg/dL — ABNORMAL HIGH (ref 65–99)
HEMATOCRIT: 36 % (ref 36.0–46.0)
HEMOGLOBIN: 12.2 g/dL (ref 12.0–15.0)
POTASSIUM: 4.1 mmol/L (ref 3.5–5.1)
SODIUM: 135 mmol/L (ref 135–145)
TCO2: 33 mmol/L (ref 0–100)

## 2015-11-15 LAB — CBC
HEMATOCRIT: 36.8 % (ref 36.0–46.0)
HEMOGLOBIN: 12.2 g/dL (ref 12.0–15.0)
MCH: 28.6 pg (ref 26.0–34.0)
MCHC: 33.2 g/dL (ref 30.0–36.0)
MCV: 86.4 fL (ref 78.0–100.0)
Platelets: 534 10*3/uL — ABNORMAL HIGH (ref 150–400)
RBC: 4.26 MIL/uL (ref 3.87–5.11)
RDW: 13.4 % (ref 11.5–15.5)
WBC: 10.2 10*3/uL (ref 4.0–10.5)

## 2015-11-15 LAB — GLUCOSE, CAPILLARY
GLUCOSE-CAPILLARY: 310 mg/dL — AB (ref 65–99)
GLUCOSE-CAPILLARY: 347 mg/dL — AB (ref 65–99)
GLUCOSE-CAPILLARY: 323 mg/dL — AB (ref 65–99)
Glucose-Capillary: 273 mg/dL — ABNORMAL HIGH (ref 65–99)
Glucose-Capillary: 115 mg/dL — ABNORMAL HIGH (ref 65–99)

## 2015-11-15 LAB — BRAIN NATRIURETIC PEPTIDE: B NATRIURETIC PEPTIDE 5: 62.6 pg/mL (ref 0.0–100.0)

## 2015-11-15 LAB — TSH: TSH: 1.83 u[IU]/mL (ref 0.350–4.500)

## 2015-11-15 LAB — T4, FREE: Free T4: 0.92 ng/dL (ref 0.61–1.12)

## 2015-11-15 MED ORDER — ALUM & MAG HYDROXIDE-SIMETH 200-200-20 MG/5ML PO SUSP
30.0000 mL | ORAL | Status: DC | PRN
  Administered 2015-11-15: 30 mL via ORAL

## 2015-11-15 MED ORDER — MIDAZOLAM HCL 2 MG/2ML IJ SOLN
2.0000 mg | Freq: Once | INTRAMUSCULAR | Status: AC
  Administered 2015-11-15: 2 mg via INTRAVENOUS
  Filled 2015-11-15: qty 2

## 2015-11-15 MED ORDER — BUPROPION HCL ER (SR) 150 MG PO TB12
150.0000 mg | ORAL_TABLET | Freq: Two times a day (BID) | ORAL | Status: DC
  Administered 2015-11-15 – 2015-11-18 (×7): 150 mg via ORAL
  Filled 2015-11-15 (×7): qty 1

## 2015-11-15 MED ORDER — INSULIN LISPRO 100 UNIT/ML (KWIKPEN)
20.0000 [IU] | PEN_INJECTOR | Freq: Three times a day (TID) | SUBCUTANEOUS | Status: DC
  Administered 2015-11-15 – 2015-11-18 (×9): 20 [IU] via SUBCUTANEOUS
  Filled 2015-11-15: qty 3

## 2015-11-15 MED ORDER — METFORMIN HCL 500 MG PO TABS
1000.0000 mg | ORAL_TABLET | Freq: Two times a day (BID) | ORAL | Status: DC
  Administered 2015-11-15 – 2015-11-18 (×7): 1000 mg via ORAL
  Filled 2015-11-15 (×7): qty 2

## 2015-11-15 MED ORDER — TRAMADOL HCL 50 MG PO TABS
50.0000 mg | ORAL_TABLET | Freq: Two times a day (BID) | ORAL | Status: DC
  Administered 2015-11-16 – 2015-11-18 (×4): 50 mg via ORAL
  Filled 2015-11-15 (×6): qty 1

## 2015-11-15 MED ORDER — ONDANSETRON HCL 4 MG/2ML IJ SOLN
4.0000 mg | Freq: Four times a day (QID) | INTRAMUSCULAR | Status: DC | PRN
  Administered 2015-11-15 (×2): 4 mg via INTRAVENOUS
  Filled 2015-11-15 (×3): qty 2

## 2015-11-15 MED ORDER — ALBUTEROL SULFATE (2.5 MG/3ML) 0.083% IN NEBU: 2.5000 mg | INHALATION_SOLUTION | Freq: Four times a day (QID) | RESPIRATORY_TRACT | Status: DC | PRN

## 2015-11-15 MED ORDER — METHIMAZOLE 10 MG PO TABS
10.0000 mg | ORAL_TABLET | Freq: Two times a day (BID) | ORAL | Status: DC
  Administered 2015-11-15 – 2015-11-18 (×7): 10 mg via ORAL
  Filled 2015-11-15 (×7): qty 1

## 2015-11-15 MED ORDER — APIXABAN 5 MG PO TABS: 5.0000 mg | ORAL_TABLET | Freq: Two times a day (BID) | ORAL | Status: DC

## 2015-11-15 MED ORDER — DILTIAZEM HCL 25 MG/5ML IV SOLN
10.0000 mg | Freq: Once | INTRAVENOUS | Status: AC
  Administered 2015-11-15: 10 mg via INTRAVENOUS
  Filled 2015-11-15: qty 5

## 2015-11-15 MED ORDER — ALUM & MAG HYDROXIDE-SIMETH 200-200-20 MG/5ML PO SUSP
15.0000 mL | ORAL | Status: DC | PRN
  Filled 2015-11-15: qty 30

## 2015-11-15 MED ORDER — APIXABAN 5 MG PO TABS
5.0000 mg | ORAL_TABLET | Freq: Two times a day (BID) | ORAL | Status: DC
  Administered 2015-11-15 – 2015-11-18 (×7): 5 mg via ORAL
  Filled 2015-11-15 (×7): qty 1

## 2015-11-15 MED ORDER — PANTOPRAZOLE SODIUM 40 MG PO TBEC
40.0000 mg | DELAYED_RELEASE_TABLET | Freq: Every day | ORAL | Status: DC
  Administered 2015-11-15 – 2015-11-18 (×4): 40 mg via ORAL
  Filled 2015-11-15 (×4): qty 1

## 2015-11-15 MED ORDER — METOPROLOL SUCCINATE ER 50 MG PO TB24
50.0000 mg | ORAL_TABLET | Freq: Every day | ORAL | Status: DC
  Administered 2015-11-15 – 2015-11-18 (×4): 50 mg via ORAL
  Filled 2015-11-15 (×4): qty 1

## 2015-11-15 MED ORDER — NITROGLYCERIN 0.4 MG SL SUBL: 0.4000 mg | SUBLINGUAL_TABLET | SUBLINGUAL | Status: DC | PRN

## 2015-11-15 MED ORDER — INSULIN GLARGINE 100 UNIT/ML ~~LOC~~ SOLN
10.0000 [IU] | Freq: Every day | SUBCUTANEOUS | Status: DC
  Administered 2015-11-15 – 2015-11-17 (×3): 10 [IU] via SUBCUTANEOUS
  Filled 2015-11-15 (×4): qty 0.1

## 2015-11-15 MED ORDER — ONDANSETRON HCL 4 MG/2ML IJ SOLN
4.0000 mg | Freq: Once | INTRAMUSCULAR | Status: AC
  Administered 2015-11-15: 4 mg via INTRAVENOUS
  Filled 2015-11-15: qty 2

## 2015-11-15 MED ORDER — FENTANYL CITRATE (PF) 100 MCG/2ML IJ SOLN
25.0000 ug | Freq: Once | INTRAMUSCULAR | Status: DC
  Filled 2015-11-15: qty 2

## 2015-11-15 MED ORDER — CLOPIDOGREL BISULFATE 75 MG PO TABS
75.0000 mg | ORAL_TABLET | Freq: Every day | ORAL | Status: DC
  Administered 2015-11-15 – 2015-11-18 (×4): 75 mg via ORAL
  Filled 2015-11-15 (×4): qty 1

## 2015-11-15 MED ORDER — SODIUM CHLORIDE 0.9 % IV BOLUS (SEPSIS)
1000.0000 mL | Freq: Once | INTRAVENOUS | Status: AC
  Administered 2015-11-15: 1000 mL via INTRAVENOUS

## 2015-11-15 MED ORDER — SOTALOL HCL 80 MG PO TABS
80.0000 mg | ORAL_TABLET | Freq: Two times a day (BID) | ORAL | Status: DC
Start: 2015-11-15 — End: 2015-11-18
  Administered 2015-11-15 – 2015-11-18 (×7): 80 mg via ORAL
  Filled 2015-11-15 (×7): qty 1

## 2015-11-15 MED ORDER — ACETAMINOPHEN 325 MG PO TABS
650.0000 mg | ORAL_TABLET | ORAL | Status: DC | PRN
  Administered 2015-11-15: 650 mg via ORAL
  Filled 2015-11-15: qty 2

## 2015-11-15 MED ORDER — ATORVASTATIN CALCIUM 20 MG PO TABS
20.0000 mg | ORAL_TABLET | Freq: Every day | ORAL | Status: DC
  Administered 2015-11-15 – 2015-11-18 (×4): 20 mg via ORAL
  Filled 2015-11-15 (×4): qty 1

## 2015-11-15 MED ORDER — LOSARTAN POTASSIUM 50 MG PO TABS
50.0000 mg | ORAL_TABLET | Freq: Every day | ORAL | Status: DC
  Administered 2015-11-15 – 2015-11-18 (×4): 50 mg via ORAL
  Filled 2015-11-15 (×4): qty 1

## 2015-11-15 MED ORDER — GABAPENTIN 300 MG PO CAPS
900.0000 mg | ORAL_CAPSULE | Freq: Two times a day (BID) | ORAL | Status: DC
  Administered 2015-11-16 – 2015-11-18 (×6): 900 mg via ORAL
  Filled 2015-11-15 (×6): qty 3

## 2015-11-15 NOTE — ED Notes (Signed)
Patient here with tachycardia rate in the 170's.  Patient is having nausea, vomiting and shortness of breath with the chest pain.  She states she has not had this as bad as she has had before.  She is CAOx4

## 2015-11-15 NOTE — ED Notes (Signed)
Dr. Dina Rich notified of CBG, ordered fluids.

## 2015-11-15 NOTE — ED Provider Notes (Signed)
CSN: [id number]     Arrival date & time 11/15/15  0141 History  By signing my name below, I, Irene Pap, attest that this documentation has been prepared under the direction and in the presence of Merryl Hacker, MD. Electronically Signed: Irene Pap, ED Scribe. 11/15/2015. 2:29 AM.    Chief Complaint  Patient presents with  . Tachycardia   The history is provided by the patient. No language interpreter was used.  HPI Comments: Judith [name] is a 65 y.o. Female with a hx of HTN, CAD, Type II DM, PAF, and diabetic peripheral neuropathy who presents to the Emergency Department complaining of increased HR onset 4 hours ago. Pt reports associated chest pain, nausea, vomiting, shooting pain to the inner left leg, and SOB. Pt was recently cardioverted with propofol for sedation and had adverse effects. She reports hx of similar symptoms, but never this severe. Pt is on Plavix, Eliquis, Cardizem, and Metoprolol. Pt does not have an appointment with her cardiologist for the next 3 months. She denies fever or chills.    Past Medical History  Diagnosis Date  . Hypertension   . Goiter   . Chronic kidney disease   . History of shingles 06/01/2013  . Abnormal EKG 07/31/2013  . GERD (gastroesophageal reflux disease)   . Carpal tunnel syndrome, bilateral   . Coronary artery disease      2 v CAD with CTO of the RCA and high grade bifurcational LCx/OM stenosis. S/P PCI DES x 2 to the LCx/OM.  Marland Kitchen Hyperlipidemia   . Asthma   . Pneumonia ~ 1976  . Type II diabetes mellitus (HCC)     insulin dependent  . Arthritis     "hands" (03/06/2015)  . Diabetic peripheral neuropathy ([place]) "since 1996"  . Carpal tunnel syndrome, bilateral   . Tremors of nervous system   . PAF (paroxysmal atrial fibrillation) ([place]) 04/29/2015    CHADS2VASC score of 4 now on Apixaban  . Hyperlipidemia LDL goal <70 10/13/2015   Past Surgical History  Procedure Laterality Date  . Tonsillectomy  1976  . Cesarean section  1982;  1984  . Carpal tunnel release Right Nov 2015  . Knee arthroscopy Right ~ 2003    "meniscus repair"  . Shoulder open rotator cuff repair Right 1996; 1998    "w/fracture repair"  . Foot neuroma surgery Bilateral 2000  . Carpal tunnel release Right 1992; 05/2014    [name]; [place]  . Thyroid surgery  2000    "removed lots of nodules"  . Cardiac catheterization N/A 02/27/2015    Procedure: Left Heart Cath and Coronary Angiography;  Surgeon: [name], MD; LAD 40%, mCFX 80%, OM 70%, RCA 100% calcified       . Cardiac catheterization N/A 03/06/2015    Procedure: Coronary Stent Intervention;  Surgeon: [name], MD;  Location: [facility];  Service: Cardiovascular;  Laterality: N/A;  Mid CX 3.50x12 promus DES w/ 0% resdual and Prox OM1 2.50x20 promus DES w/ 20% residual  . Abdominal hysterectomy  1988    age 50; CERVICAL DYSPLASIA; ovaries intact.    Family History  Problem Relation Age of Onset  . Cancer Mother 65    bronchial cancer  . Hypertension Father   . COPD Father   . Heart disease Father 75    CAD with cardiac stenting  . Heart attack Father   . Allergies Sister   . Breast cancer Maternal Grandmother   . Emphysema Maternal Grandfather   .  Leukemia Paternal Grandmother   . Emphysema Paternal Grandfather   . Thyroid disease Neg Hx   . Breast cancer Mother   . Lung cancer Mother   . Parkinson's disease Father    Social History  Substance Use Topics  . Smoking status: Former Smoker -- 0.00 packs/day for 41 years    Types: Cigarettes  . Smokeless tobacco: Never Used     Comment: 04/29/2015 "quit smoking cigarettes 02/27/2015"  . Alcohol Use: No   OB History    No data available     Review of Systems  Constitutional: Negative for fever and chills.  Respiratory: Positive for shortness of breath.   Cardiovascular: Positive for chest pain.  Gastrointestinal: Positive for nausea and vomiting.  Musculoskeletal: Positive for arthralgias.  All other  systems reviewed and are negative.  Allergies  Novolog; Codeine; Iodine; Demerol; Ace inhibitors; Neosporin; Penicillins; Percocet; and Tape  Home Medications   Prior to Admission medications   Medication Sig Start Date End Date Taking? Authorizing Provider  albuterol (PROVENTIL HFA) 108 (90 Base) MCG/ACT inhaler Inhale 1-2 puffs into the lungs every 6 (six) hours as needed for wheezing or shortness of breath. 11/07/15  Yes [name], MD  apixaban (ELIQUIS) 5 MG TABS tablet Take 1 tablet (5 mg total) by mouth 2 (two) times daily. 05/01/15  Yes Bhavinkumar Bhagat, PA  atorvastatin (LIPITOR) 20 MG tablet Take 1 tablet (20 mg total) by mouth daily. 04/22/15  Yes Liliane Shi, PA-C  buPROPion (WELLBUTRIN SR) 150 MG 12 hr tablet TAKE ONE TABLET BY MOUTH TWICE DAILY 11/08/15  Yes [name], MD  clopidogrel (PLAVIX) 75 MG tablet Take 1 tablet (75 mg total) by mouth daily. 02/27/15  Yes [name], MD  cyclobenzaprine (FLEXERIL) 5 MG tablet TAKE ONE TABLET BY MOUTH THREE TIMES DAILY AS NEEDED FOR MUSCLE SPASMS 11/08/15  Yes [name], MD  diltiazem (CARDIZEM CD) 240 MG 24 hr capsule Take 1 capsule (240 mg total) by mouth daily. 05/01/15  Yes Bhavinkumar Bhagat, PA  FLUoxetine (PROZAC) 20 MG capsule Take 1 capsule (20 mg total) by mouth daily. Patient taking differently: Take 20 mg by mouth at bedtime.  11/08/15  Yes [name], MD  gabapentin (NEURONTIN) 300 MG capsule TAKE 3 CAPSULES BY MOUTH AT LUNCHTIME AND 3 CAPSULES AT BEDTIME Patient taking differently: TAKE 3 CAPSULES BY MOUTH AT morning AND 3 CAPSULES AT BEDTIME 10/13/15  Yes [name], MD  Insulin Glargine (BASAGLAR KWIKPEN) 100 UNIT/ML SOPN Inject 0.1 mLs (10 Units total) into the skin at bedtime. Patient taking differently: Inject 12 Units into the skin daily at 10 pm.  10/15/15  Yes Renato Shin, MD  insulin lispro (HUMALOG) 100 UNIT/ML injection Inject 0.2 mLs (20 Units total) into the skin 3 (three) times daily with  meals. 10/15/15  Yes Renato Shin, MD  losartan (COZAAR) 50 MG tablet Take 1 tablet (50 mg total) by mouth daily. 03/13/15  Yes Liliane Shi, PA-C  metFORMIN (GLUCOPHAGE) 1000 MG tablet Take 1 tablet (1,000 mg total) by mouth 2 (two) times daily with a meal. 10/23/15  Yes Renato Shin, MD  methimazole (TAPAZOLE) 10 MG tablet Take 1 tablet (10 mg total) by mouth 2 (two) times daily. 10/15/15  Yes Renato Shin, MD  metoprolol succinate (TOPROL-XL) 25 MG 24 hr tablet Take 3 tablets (75 mg total) by mouth daily. Take with or immediately following a meal. Patient taking differently: Take 25 mg by mouth 2 (two) times daily.  Take with or immediately following a meal. 10/13/15  Yes Sueanne Margarita, MD  pantoprazole (PROTONIX) 40 MG tablet Take 1 tablet (40 mg total) by mouth daily. 03/07/15  Yes Rhonda G Barrett, PA-C  traMADol (ULTRAM) 50 MG tablet Take 50-100 mg by mouth 2 (two) times daily. Usually takes 1 tab in am as needed, and 2 tablets at bedtime   Yes Historical Provider, MD  nitroGLYCERIN (NITROSTAT) 0.4 MG SL tablet Place 1 tablet (0.4 mg total) under the tongue every 5 (five) minutes as needed for chest pain. 02/25/15   Liliane Shi, PA-C  ondansetron (ZOFRAN) 4 MG tablet Take 1 tablet (4 mg total) by mouth every 8 (eight) hours as needed for nausea or vomiting. Patient not taking: Reported on 11/15/2015 04/13/15   [name], PA-C   BP 148/84 mmHg  Pulse 99  Temp(Src) 98.7 F (37.1 C) (Oral)  Resp 17  SpO2 96% Physical Exam  Constitutional: She is oriented to person, place, and time. She appears well-developed and well-nourished.  HENT:  Head: Normocephalic and atraumatic.  Eyes: Pupils are equal, round, and reactive to light.  Neck: Neck supple.  Cardiovascular: Normal heart sounds.   Tachycardia, irregular rhythm  Pulmonary/Chest: Effort normal. No respiratory distress. She has no wheezes.  Abdominal: Soft. Bowel sounds are normal.  Neurological: She is alert and oriented to person,  place, and time.  Skin: Skin is warm and dry.  Psychiatric: She has a normal mood and affect.  Nursing note and vitals reviewed.   ED Course  .Cardioversion Date/Time: 11/15/2015 4:09 AM Performed by: Merryl Hacker Authorized by: Merryl Hacker Consent: Verbal consent obtained. Risks and benefits: risks, benefits and alternatives were discussed Consent given by: patient Patient identity confirmed: verbally with patient Time out: Immediately prior to procedure a "time out" was called to verify the correct patient, procedure, equipment, support staff and site/side marked as required. Patient sedated: yes Sedation type: anxiolysis Sedatives: midazolam Vitals: Vital signs were monitored during sedation. Cardioversion basis: emergent Pre-procedure rhythm: atrial fibrillation Patient position: patient was placed in a supine position Chest area: chest area exposed Electrodes: pads Electrodes placed: anterior-posterior Number of attempts: 1 Attempt 1 mode: synchronous Attempt 1 waveform: monophasic Attempt 1 shock (in Joules): 200 Attempt 1 outcome: conversion to normal sinus rhythm Post-procedure rhythm: normal sinus rhythm Complications: no complications Patient tolerance: Patient tolerated the procedure well with no immediate complications   (including critical care time)  CRITICAL CARE Performed by: Merryl Hacker   Total critical care time: 25 minutes  Critical care time was exclusive of separately billable procedures and treating other patients.  Critical care was necessary to treat or prevent imminent or life-threatening deterioration.  Critical care was time spent personally by me on the following activities: development of treatment plan with patient and/or surrogate as well as nursing, discussions with consultants, evaluation of patient's response to treatment, examination of patient, obtaining history from patient or surrogate, ordering and performing  treatments and interventions, ordering and review of laboratory studies, ordering and review of radiographic studies, pulse oximetry and re-evaluation of patient's condition.  DIAGNOSTIC STUDIES: Oxygen Saturation is 93% on RA, low by my interpretation.    COORDINATION OF CARE: 2:28 AM-Discussed treatment plan which includes labs, EKG, and x-ray with pt at bedside and pt agreed to plan.    Labs Review Labs Reviewed  CBC - Abnormal; Notable for the following:    Platelets 534 (*)    All other components within normal  limits  COMPREHENSIVE METABOLIC PANEL - Abnormal; Notable for the following:    Sodium 133 (*)    Chloride 91 (*)    Glucose, Bld 351 (*)    BUN 29 (*)    Creatinine, Ser 1.35 (*)    Albumin 3.1 (*)    Alkaline Phosphatase 128 (*)    GFR calc non Af Amer 41 (*)    GFR calc Af Amer 47 (*)    All other components within normal limits  I-STAT CHEM 8, ED - Abnormal; Notable for the following:    Chloride 90 (*)    BUN 30 (*)    Creatinine, Ser 1.30 (*)    Glucose, Bld 352 (*)    All other components within normal limits  CBG MONITORING, ED - Abnormal; Notable for the following:    Glucose-Capillary 422 (*)    All other components within normal limits  I-STAT TROPOININ, ED    Imaging Review Dg Chest Portable 1 View  11/15/2015  CLINICAL DATA:  Acute onset of tachycardia, nausea, vomiting and shortness of breath. Initial encounter. EXAM: PORTABLE CHEST 1 VIEW COMPARISON:  Chest radiograph performed 11/01/2015 FINDINGS: The lungs are well-aerated and clear. There is no evidence of focal opacification, pleural effusion or pneumothorax. The cardiomediastinal silhouette is borderline enlarged. No acute osseous abnormalities are seen. IMPRESSION: Borderline cardiomegaly.  Lungs remain grossly clear. Electronically Signed   By: Garald Balding M.D.   On: 11/15/2015 02:23   I have personally reviewed and evaluated these images and lab results as part of my medical  decision-making.   EKG Interpretation   Date/Time:  Saturday November 15 2015 01:46:27 EDT Ventricular Rate:  180 PR Interval:  80 QRS Duration: 68 QT Interval:  294 QTC Calculation: 509 R Axis:   -43 Text Interpretation:  Atrial fibrillation Left axis deviation Possible  Inferior infarct , age undetermined Anteroseptal infarct , age  undetermined Abnormal ECG Confirmed by Jadeyn Hargett  MD, [place] ([id number]) on  11/15/2015 2:18:15 AM      MDM   Final diagnoses:  Atrial fibrillation with RVR ([place])    Patient presents with atrial fibrillation RVR. On a liquids. Has had 2 prior cardioversions while in the ED. She's currently on only rate control medications. Reports that she did not tolerate propofol and prefers Versed for cardioversion. I discussed this with cardiology. My concern is that patient has had multiple evaluations for A. fib with RVR in the emergency room. She may need further medication adjustment as well. Cardiology agrees. They will plan to admit. Patient was given 10 mg dose of diltiazem. Patient was cardioverted with her said with no immediate complications. She is now on sinus rhythm. Symptoms have resolved. Will admit to cardiology.  I personally performed the services described in this documentation, which was scribed in my presence. The recorded information has been reviewed and is accurate.    Merryl Hacker, MD 11/15/15 [phone]

## 2015-11-15 NOTE — H&P (Signed)
Cardiologist: Dr. Radford Pax  CC: AF with RVR  HPI: 65 y.o. female presents again with palpitations, heart racing associated with chest tightness and mild SOB. Noted to be back in AF with RVR. She was in ER in 04/2015 when treated with Dilt and converted to sinus. Eliquis was initiated. Was already on Metoprolol. She persented to ER on 10/09/2015 and again on 10/31/2015 with AF and RVR and each time was electrically cardioverted in the ER. Never been on any antiarrhythmic drugs. No syncope.   She has hx of CAD, abnormal stress test leading to cath demonstrating 2 vessel CAD (CTO of RCA and high grade lesion at LCX/OM) treated with PCI to LCX/OM in 02/2015, normal LVEF, DM, HTN, tobacco abuse, on methimazole for hyperthyroidism (lowest TSH 0.25; mildly elevated free T3/T4).   Today in the ER, she was electrically cardioverted again (3rd time over a 5 week period).   Review of Systems:  10 systems reviewed unremarkable except as noted in HPI     Past Medical History  Diagnosis Date  . Hypertension   . Goiter   . Chronic kidney disease   . History of shingles 06/01/2013  . Abnormal EKG 07/31/2013  . GERD (gastroesophageal reflux disease)   . Carpal tunnel syndrome, bilateral   . Coronary artery disease      2 v CAD with CTO of the RCA and high grade bifurcational LCx/OM stenosis. S/P PCI DES x 2 to the LCx/OM.  Marland Kitchen Hyperlipidemia   . Asthma   . Pneumonia ~ 1976  . Type II diabetes mellitus (HCC)     insulin dependent  . Arthritis     "hands" (03/06/2015)  . Diabetic peripheral neuropathy ([place]) "since 1996"  . Carpal tunnel syndrome, bilateral   . Tremors of nervous system   . PAF (paroxysmal atrial fibrillation) ([place]) 04/29/2015    CHADS2VASC score of 4 now on Apixaban  . Hyperlipidemia LDL goal <70 10/13/2015    No current facility-administered medications on file prior to encounter.   Current Outpatient Prescriptions on File Prior to Encounter  Medication Sig Dispense Refill  .  albuterol (PROVENTIL HFA) 108 (90 Base) MCG/ACT inhaler Inhale 1-2 puffs into the lungs every 6 (six) hours as needed for wheezing or shortness of breath. 1 Inhaler 0  . apixaban (ELIQUIS) 5 MG TABS tablet Take 1 tablet (5 mg total) by mouth 2 (two) times daily. 60 tablet 11  . atorvastatin (LIPITOR) 20 MG tablet Take 1 tablet (20 mg total) by mouth daily. 90 tablet 1  . buPROPion (WELLBUTRIN SR) 150 MG 12 hr tablet TAKE ONE TABLET BY MOUTH TWICE DAILY 60 tablet 0  . clopidogrel (PLAVIX) 75 MG tablet Take 1 tablet (75 mg total) by mouth daily. 30 tablet 11  . cyclobenzaprine (FLEXERIL) 5 MG tablet TAKE ONE TABLET BY MOUTH THREE TIMES DAILY AS NEEDED FOR MUSCLE SPASMS 30 tablet 0  . diltiazem (CARDIZEM CD) 240 MG 24 hr capsule Take 1 capsule (240 mg total) by mouth daily. 30 capsule 11  . FLUoxetine (PROZAC) 20 MG capsule Take 1 capsule (20 mg total) by mouth daily. (Patient taking differently: Take 20 mg by mouth at bedtime. ) 30 capsule 0  . gabapentin (NEURONTIN) 300 MG capsule TAKE 3 CAPSULES BY MOUTH AT LUNCHTIME AND 3 CAPSULES AT BEDTIME (Patient taking differently: TAKE 3 CAPSULES BY MOUTH AT morning AND 3 CAPSULES AT BEDTIME) 180 capsule 1  . Insulin Glargine (BASAGLAR KWIKPEN) 100 UNIT/ML SOPN Inject 0.1 mLs (10 Units  total) into the skin at bedtime. (Patient taking differently: Inject 12 Units into the skin daily at 10 pm. ) 15 mL 11  . insulin lispro (HUMALOG) 100 UNIT/ML injection Inject 0.2 mLs (20 Units total) into the skin 3 (three) times daily with meals. 30 mL 11  . losartan (COZAAR) 50 MG tablet Take 1 tablet (50 mg total) by mouth daily. 30 tablet 11  . metFORMIN (GLUCOPHAGE) 1000 MG tablet Take 1 tablet (1,000 mg total) by mouth 2 (two) times daily with a meal. 180 tablet 1  . methimazole (TAPAZOLE) 10 MG tablet Take 1 tablet (10 mg total) by mouth 2 (two) times daily. 60 tablet 11  . metoprolol succinate (TOPROL-XL) 25 MG 24 hr tablet Take 3 tablets (75 mg total) by mouth daily.  Take with or immediately following a meal. (Patient taking differently: Take 25 mg by mouth 2 (two) times daily. Take with or immediately following a meal.) 90 tablet 11  . pantoprazole (PROTONIX) 40 MG tablet Take 1 tablet (40 mg total) by mouth daily. 30 tablet 11  . traMADol (ULTRAM) 50 MG tablet Take 50-100 mg by mouth 2 (two) times daily. Usually takes 1 tab in am as needed, and 2 tablets at bedtime    . nitroGLYCERIN (NITROSTAT) 0.4 MG SL tablet Place 1 tablet (0.4 mg total) under the tongue every 5 (five) minutes as needed for chest pain. 25 tablet 3  . ondansetron (ZOFRAN) 4 MG tablet Take 1 tablet (4 mg total) by mouth every 8 (eight) hours as needed for nausea or vomiting. (Patient not taking: Reported on 11/15/2015) 20 tablet 0      Allergies  Allergen Reactions  . Novolog [Insulin Aspart] Shortness Of Breath and Other (See Comments)    "breathing problems"  . Codeine Nausea And Vomiting and Other (See Comments)    HIGH DOSES-SEVERE VOMITING  . Iodine Other (See Comments)    MUST HAVE BENADRYL PRIOR TO PROCEDURE AND RIGHT BEFORE TREATMENT TO COUNTERACT REACTION-BLISTERING REACTION DERMATOLOGICAL  . Demerol [Meperidine] Nausea And Vomiting  . Ace Inhibitors Cough  . Neosporin [Neomycin-Bacitracin Zn-Polymyx] Itching and Rash    MAKES REACTIONS WORSE WHEN USING AS PROPHYLACTIC  . Penicillins Itching, Rash and Other (See Comments)    Has patient had a PCN reaction causing immediate rash, facial/tongue/throat swelling, SOB or lightheadedness with hypotension: Yes Has patient had a PCN reaction causing severe rash involving mucus membranes or skin necrosis: No Has patient had a PCN reaction that required hospitalization No Has patient had a PCN reaction occurring within the last 10 years: No If all of the above answers are "NO", then may proceed with Cephalosporin use.  CHEST SIZED RASH AND ITCHING   . Percocet [Oxycodone-Acetaminophen] Rash  . Tape Itching and Rash    Social  History   Social History  . Marital Status: Divorced    Spouse Name: N/A  . Number of Children: 2  . Years of Education: Masters   Occupational History  . Food Therapist, nutritional    Social History Main Topics  . Smoking status: Former Smoker -- 0.00 packs/day for 41 years    Types: Cigarettes  . Smokeless tobacco: Never Used     Comment: 04/29/2015 "quit smoking cigarettes 02/27/2015"  . Alcohol Use: No  . Drug Use: No  . Sexual Activity: Not Currently    Birth Control/ Protection: Post-menopausal, Surgical   Other Topics Concern  . 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      Lives with sister in [place].      Tobacco: 1 ppd x 41 years - quit 2016      Alcohol: none      Drugs: none      Exercise:  Walking in neighborhood; physical job.   Right-handed.   2 cups caffeine daily.       Family History  Problem Relation Age of Onset  . Cancer Mother 90    bronchial cancer  . Hypertension Father   . COPD Father   . Heart disease Father 41    CAD with cardiac stenting  . Heart attack Father   . Allergies Sister   . Breast cancer Maternal Grandmother   . Emphysema Maternal Grandfather   . Leukemia Paternal Grandmother   . Emphysema Paternal Grandfather   . Thyroid disease Neg Hx   . Breast cancer Mother   . Lung cancer Mother   . Parkinson's disease Father     PHYSICAL EXAM: Filed Vitals:   11/15/15 0311 11/15/15 0318  BP: 126/85   Pulse: 95 98  Temp:    Resp: 21 18   General:  Well appearing. No respiratory difficulty HEENT: normal Neck: supple. no JVD. Carotids 2+ bilat; no bruits. No lymphadenopathy or thryomegaly appreciated. Cor: PMI nondisplaced. Regular rate & rhythm. No rubs, gallops or murmurs. Lungs: clear Abdomen:  soft, nontender, nondistended. No hepatosplenomegaly. No bruits or masses. Good bowel sounds. Extremities: no cyanosis, clubbing, rash, edema Neuro: alert & oriented x 3, cranial nerves grossly intact. moves all 4 extremities w/o difficulty. Affect pleasant.  ECG: AF V rate 180 bpm, left axis deviation  Results for orders placed or performed during the hospital encounter of 11/15/15 (from the past 24 hour(s))  CBC     Status: Abnormal   Collection Time: 11/15/15  1:54 AM  Result Value Ref Range   WBC 10.2 4.0 - 10.5 K/uL   RBC 4.26 3.87 - 5.11 MIL/uL   Hemoglobin 12.2 12.0 - 15.0 g/dL   HCT 36.8 36.0 - 46.0 %   MCV 86.4 78.0 - 100.0 fL   MCH 28.6 26.0 - 34.0 pg   MCHC 33.2 30.0 - 36.0 g/dL   RDW 13.4 11.5 - 15.5 %   Platelets 534 (H) 150 - 400 K/uL  Comprehensive metabolic panel     Status: Abnormal   Collection Time: 11/15/15  1:54 AM  Result Value Ref Range   Sodium 133 (L) 135 - 145 mmol/L   Potassium 4.0 3.5 - 5.1 mmol/L   Chloride 91 (L) 101 - 111 mmol/L   CO2 30 22 - 32 mmol/L   Glucose, Bld 351 (H) 65 - 99 mg/dL   BUN 29 (H) 6 - 20 mg/dL   Creatinine, Ser 1.35 (H) 0.44 - 1.00 mg/dL   Calcium 10.1 8.9 - 10.3 mg/dL   Total Protein 7.1 6.5 - 8.1 g/dL   Albumin 3.1 (L) 3.5 - 5.0 g/dL   AST 16 15 - 41 U/L   ALT 14 14 - 54 U/L   Alkaline Phosphatase 128 (H) 38 - 126 U/L   Total Bilirubin 0.3 0.3 - 1.2 mg/dL   GFR calc non Af Amer 41 (L) >60 mL/min   GFR calc Af Amer 47 (L) >60 mL/min  Anion gap 12 5 - 15  I-stat troponin, ED (not at [place], [facility])     Status: None   Collection Time: 11/15/15  2:03 AM  Result Value Ref Range   Troponin i, poc 0.00 0.00 - 0.08 ng/mL   Comment 3          I-Stat Chem 8, ED  (not at [facility], [facility])     Status: Abnormal   Collection Time: 11/15/15  2:04 AM  Result Value Ref Range   Sodium 135 135 - 145 mmol/L   Potassium 4.1 3.5 - 5.1 mmol/L   Chloride 90 (L) 101 - 111 mmol/L   BUN 30 (H) 6 - 20 mg/dL   Creatinine, Ser 1.30 (H) 0.44 - 1.00 mg/dL    Glucose, Bld 352 (H) 65 - 99 mg/dL   Calcium, Ion 1.18 1.12 - 1.23 mmol/L   TCO2 33 0 - 100 mmol/L   Hemoglobin 12.2 12.0 - 15.0 g/dL   HCT 36.0 36.0 - 46.0 %   Dg Chest Portable 1 View  11/15/2015  CLINICAL DATA:  Acute onset of tachycardia, nausea, vomiting and shortness of breath. Initial encounter. EXAM: PORTABLE CHEST 1 VIEW COMPARISON:  Chest radiograph performed 11/01/2015 FINDINGS: The lungs are well-aerated and clear. There is no evidence of focal opacification, pleural effusion or pneumothorax. The cardiomediastinal silhouette is borderline enlarged. No acute osseous abnormalities are seen. IMPRESSION: Borderline cardiomegaly.  Lungs remain grossly clear. Electronically Signed   By: Garald Balding M.D.   On: 11/15/2015 02:23     ASSESSMENT:  1. AF with RVR - underwent DCCV in the ER (this is her third DCCV over a 5 weeks period) - Stable BP - CHA2DS2-VASc score is high = 4 (gender, HTN, DM, CAD) - on Eliquis for primary stroke prevention  - Was on Metoprolol 75 po qd and DIlt 240 mg po qd at home   2. Hyperthyroidism - being followed by Endocrinology, on Methimazole    PLAN/DISCUSSION:  Given recurrent episodes of AF with RVR requiring DCCV, will initiate anti-arrhythmic therapy as an inpatient. Due to her CAD cannot use class IC drugs (Flecainide, Propafenone). She has thyroid issues so will avoid Amiodarone. Will try Sotalol 80 mg po bid, stop Diltiazem.   Continue Eliquis.   Admit to cardiology. Please see orders for details. Check thyroid function test.   Wandra Mannan, MD

## 2015-11-16 DIAGNOSIS — I10 Essential (primary) hypertension: Secondary | ICD-10-CM

## 2015-11-16 LAB — BASIC METABOLIC PANEL
Anion gap: 9 (ref 5–15)
BUN: 25 mg/dL — ABNORMAL HIGH (ref 6–20)
CHLORIDE: 99 mmol/L — AB (ref 101–111)
CO2: 28 mmol/L (ref 22–32)
CREATININE: 0.99 mg/dL (ref 0.44–1.00)
Calcium: 10.1 mg/dL (ref 8.9–10.3)
GFR, EST NON AFRICAN AMERICAN: 59 mL/min — AB (ref >60)
Glucose, Bld: 93 mg/dL (ref 65–99)
POTASSIUM: 4.6 mmol/L (ref 3.5–5.1)
SODIUM: 136 mmol/L (ref 135–145)

## 2015-11-16 LAB — GLUCOSE, CAPILLARY
GLUCOSE-CAPILLARY: 99 mg/dL (ref 65–99)
GLUCOSE-CAPILLARY: 112 mg/dL — AB (ref 65–99)
Glucose-Capillary: 156 mg/dL — ABNORMAL HIGH (ref 65–99)
Glucose-Capillary: 83 mg/dL (ref 65–99)

## 2015-11-16 LAB — MAGNESIUM: MAGNESIUM: 1.5 mg/dL — AB (ref 1.7–2.4)

## 2015-11-16 MED ORDER — MAGNESIUM SULFATE 50 % IJ SOLN
3.0000 g | Freq: Once | INTRAVENOUS | Status: AC
  Administered 2015-11-16: 3 g via INTRAVENOUS
  Filled 2015-11-16: qty 6

## 2015-11-16 MED ORDER — AMLODIPINE BESYLATE 5 MG PO TABS
5.0000 mg | ORAL_TABLET | Freq: Every day | ORAL | Status: DC
  Administered 2015-11-16 – 2015-11-18 (×3): 5 mg via ORAL
  Filled 2015-11-16 (×3): qty 1

## 2015-11-16 NOTE — Progress Notes (Signed)
Patient: Judith [name] / Admit Date: 11/15/2015 / Date of Encounter: 11/16/2015, 10:37 AM   Subjective: No CP or dyspnea. Feels somewhat tired - doesn't quite feel like herself.  EKG NSR QTC 424ms. Prior sinus QT's have been 438, 450, 459.  Objective: Telemetry: NSR since DCCV Physical Exam: Blood pressure 172/75, pulse 76, temperature 98.6 F (37 C), temperature source Oral, resp. rate 18, height 5\' 4"  (1.626 m), weight 181 lb (82.101 kg), SpO2 94 %. General: Well developed, well nourished WF, in no acute distress. Lying flat in bed Head: Normocephalic, atraumatic, sclera non-icteric, no xanthomas, nares are without discharge.  Neck: JVP not elevated. Lungs: Clear bilaterally to auscultation without wheezes, rales, or rhonchi. Breathing is unlabored. Heart: RRR S1 S2 without murmurs, rubs, or gallops.  Abdomen: Soft, non-tender, non-distended with normoactive bowel sounds. No rebound/guarding. Extremities: No clubbing or cyanosis. No edema. Distal pedal pulses are 2+ and equal bilaterally. Neuro: Alert and oriented X 3. Moves all extremities spontaneously. Psych:  Responds to questions appropriately with a normal affect.   Intake/Output Summary (Last 24 hours) at 11/16/15 1037 Last data filed at 11/15/15 1806  Gross per 24 hour  Intake    460 ml  Output      1 ml  Net    459 ml    Inpatient Medications:  . apixaban  5 mg Oral BID  . atorvastatin  20 mg Oral Daily  . buPROPion  150 mg Oral BID WC  . clopidogrel  75 mg Oral Daily  . gabapentin  900 mg Oral BID  . insulin glargine  10 Units Subcutaneous QHS  . insulin lispro  20 Units Subcutaneous TID WC  . losartan  50 mg Oral Daily  . metFORMIN  1,000 mg Oral BID WC  . methimazole  10 mg Oral BID  . metoprolol succinate  50 mg Oral Daily  . pantoprazole  40 mg Oral Daily  . sotalol  80 mg Oral Q12H  . traMADol  50 mg Oral Q12H   Infusions:    Labs:  Recent Labs  11/15/15 0154 11/15/15 0204 11/15/15 0629  NA 133*  135 136  K 4.0 4.1 4.3  CL 91* 90* 95*  CO2 30  --  32  GLUCOSE 351* 352* 296*  BUN 29* 30* 27*  CREATININE 1.35* 1.30* 1.06*  CALCIUM 10.1  --  9.8  MG  --   --  1.5*    Recent Labs  11/15/15 0154  AST 16  ALT 14  ALKPHOS 128*  BILITOT 0.3  PROT 7.1  ALBUMIN 3.1*    Recent Labs  11/15/15 0154 11/15/15 0204  WBC 10.2  --   HGB 12.2 12.2  HCT 36.8 36.0  MCV 86.4  --   PLT 534*  --    No results for input(s): CKTOTAL, CKMB, TROPONINI in the last 72 hours. Invalid input(s): POCBNP No results for input(s): HGBA1C in the last 72 hours.   Radiology/Studies:  Dg Chest 2 View  11/01/2015  CLINICAL DATA:  65 year old female with shortness of breath. Status post cardioversion. EXAM: CHEST  2 VIEW COMPARISON:  Radiograph dated 10/31/2015 FINDINGS: Two views of the chest demonstrate emphysematous changes of the lungs with chronic interstitial coarsening. No focal consolidation, pleural effusion, or pneumothorax. The cardiac silhouette is within normal limits with no acute osseous pathology. IMPRESSION: No active cardiopulmonary disease. Electronically Signed   By: Anner Crete M.D.   On: 11/01/2015 01:22   Dg Chest 2 View  10/31/2015  CLINICAL DATA:  A-fib, SOB, chest pain X couple hours Hx: HTN, diabetes, CAD, A-fib, ex-smoker EXAM: CHEST  2 VIEW COMPARISON:  Chest radiograph 10/09/2015 FINDINGS: Normal cardiac silhouette ectatic aorta. Lungs are mildly hyperinflated. No effusion, infiltrate pneumothorax. IMPRESSION: Hyperinflated lungs without acute findings. Electronically Signed   By: Suzy Bouchard M.D.   On: 10/31/2015 22:18   Dg Chest Portable 1 View  11/15/2015  CLINICAL DATA:  Acute onset of tachycardia, nausea, vomiting and shortness of breath. Initial encounter. EXAM: PORTABLE CHEST 1 VIEW COMPARISON:  Chest radiograph performed 11/01/2015 FINDINGS: The lungs are well-aerated and clear. There is no evidence of focal opacification, pleural effusion or pneumothorax. The  cardiomediastinal silhouette is borderline enlarged. No acute osseous abnormalities are seen. IMPRESSION: Borderline cardiomegaly.  Lungs remain grossly clear. Electronically Signed   By: Garald Balding M.D.   On: 11/15/2015 02:23     Assessment and Plan  65F with history of PAF, CAD (CTO of RCA and DES to bifurcation LCX/OM 02/2015), DM, HTN, tobacco abuse, hyperthyroidism (on methimazole) who presented to [facility] with recurrent AF RVR. She was in the ER 10/09/15, 10/31/15 and was cardioverted in the ER. Presented back 11/15/15 with palpitations a/w chest tightness and mild SOB -> AF RVR -> s/p DCCV in ER. CHADSDVASC = 4.  1. Paroxysmal atrial fib with RVR - s/p 3rd ED DCCV in 5 weeks' time. On Eliquis. Diltiazem stopped on admission, metoprolol reduced, and started on sotalol 11/15/15. No further AF thus far. QT appears similar to previous sinus tracings. Continue monitoring with present regimen. Will also review 11/15/15 1:50a EKG with MD given ? NSVT versus abberrant conduction.  2. Acute kidney injury - baseline Cr around 0.8. May have been due to RVR. F/u labs this AM.  3. Hypomagnesemia  - f/u Mg level this AM with renal function and plan repletion based on this.  4. Hyperthyroidism - normal TFTs.  5. CAD - neg troponin on adm, neg BNP. Continue Plavix, BB, statin. No recent CP otherwise.  6. HTN - BP running higher this AM. Will request recheck. May need to sub in amlodipine for dilt which was stopped due to sotalol initiation.  Signed, Melina Copa PA-C Pager: [phone]

## 2015-11-16 NOTE — Progress Notes (Signed)
Call placed to Lab/Phlebotomy requesting completion of stat BMET and magnesium levels.

## 2015-11-16 NOTE — Progress Notes (Signed)
Mg remained low on todays labs, have supplemented. Susette Seminara PA-C

## 2015-11-17 ENCOUNTER — Ambulatory Visit: Payer: Commercial Managed Care - HMO | Admitting: Podiatry

## 2015-11-17 ENCOUNTER — Encounter (HOSPITAL_COMMUNITY): Payer: Self-pay | Admitting: Physician Assistant

## 2015-11-17 DIAGNOSIS — I480 Paroxysmal atrial fibrillation: Principal | ICD-10-CM

## 2015-11-17 DIAGNOSIS — N179 Acute kidney failure, unspecified: Secondary | ICD-10-CM

## 2015-11-17 LAB — GLUCOSE, CAPILLARY
GLUCOSE-CAPILLARY: 84 mg/dL (ref 65–99)
Glucose-Capillary: 208 mg/dL — ABNORMAL HIGH (ref 65–99)
Glucose-Capillary: 154 mg/dL — ABNORMAL HIGH (ref 65–99)
Glucose-Capillary: 232 mg/dL — ABNORMAL HIGH (ref 65–99)

## 2015-11-17 LAB — BASIC METABOLIC PANEL
ANION GAP: 9 (ref 5–15)
BUN: 27 mg/dL — AB (ref 6–20)
CALCIUM: 9.7 mg/dL (ref 8.9–10.3)
CO2: 25 mmol/L (ref 22–32)
CREATININE: 1 mg/dL (ref 0.44–1.00)
Chloride: 100 mmol/L — ABNORMAL LOW (ref 101–111)
GFR calc Af Amer: >60 mL/min (ref >60)
GFR, EST NON AFRICAN AMERICAN: 59 mL/min — AB (ref >60)
GLUCOSE: 269 mg/dL — AB (ref 65–99)
Potassium: 4.8 mmol/L (ref 3.5–5.1)
Sodium: 134 mmol/L — ABNORMAL LOW (ref 135–145)

## 2015-11-17 LAB — MAGNESIUM: Magnesium: 1.8 mg/dL (ref 1.7–2.4)

## 2015-11-17 MED ORDER — INSULIN ASPART 100 UNIT/ML ~~LOC~~ SOLN: 0.0000 [IU] | Freq: Three times a day (TID) | SUBCUTANEOUS | Status: DC

## 2015-11-17 NOTE — Progress Notes (Signed)
Patient Name: Judith [name] Date of Encounter: 11/17/2015  Principal Problem:   Paroxysmal atrial fibrillation with rapid ventricular response (HCC) Active Problems:   HTN (hypertension)   Acute kidney injury ([place])   Coronary artery disease involving native coronary artery of native heart without angina pectoris   Hyperthyroidism   Hypomagnesemia   Primary Cardiologist: Dr Radford Pax  Patient Profile: 65 yo female w/ hx PAF/RVR s/p electrical cardioversion x 3, 2 vessel CAD (CTO of RCA and high grade lesion at LCX/OM) treated with PCI to LCX/OM in 02/2015, normal LVEF, DM, HTN, tobacco abuse, on methimazole for hyperthyroidism (lowest TSH 0.25; mildly elevated free T3/T4), admitted 07/08 w/ rapid afib>>DCCV>>SR. Now loading Sotalol.  SUBJECTIVE: Had brief SOB this am, no palpitations or CP, started at rest, resolved spontaneously  OBJECTIVE Filed Vitals:   11/16/15 1055 11/16/15 1433 11/16/15 2121 11/17/15 0408  BP: 141/72 151/73 116/62 130/64  Pulse: 66 68 63 71  Temp: 98.5 F (36.9 C) 98.7 F (37.1 C) 97.5 F (36.4 C) 98.1 F (36.7 C)  TempSrc: Oral Oral Oral Oral  Resp:  18 18 16   Height:      Weight:      SpO2: 95% 94% 99% 96%    Intake/Output Summary (Last 24 hours) at 11/17/15 1018 Last data filed at 11/17/15 0700  Gross per 24 hour  Intake    120 ml  Output      0 ml  Net    120 ml   Filed Weights   11/15/15 0420  Weight: 181 lb (82.101 kg)    PHYSICAL EXAM General: Well developed, well nourished, female in no acute distress. Head: Normocephalic, atraumatic.  Neck: Supple without bruits, JVD not elevated. Lungs:  Resp regular and unlabored, CTA. Heart: RRR, S1, S2, no S3, S4, or murmur; no rub. Abdomen: Soft, non-tender, non-distended, BS + x 4.  Extremities: No clubbing, cyanosis, edema.  Neuro: Alert and oriented X 3. Moves all extremities spontaneously. Psych: Normal affect.  LABS: CBC:  Recent Labs  11/15/15 0154 11/15/15 0204  WBC 10.2   --   HGB 12.2 12.2  HCT 36.8 36.0  MCV 86.4  --   PLT 534*  --    Basic Metabolic Panel:  Recent Labs  11/16/15 1146 11/17/15 0313  NA 136 134*  K 4.6 4.8  CL 99* 100*  CO2 28 25  GLUCOSE 93 269*  BUN 25* 27*  CREATININE 0.99 1.00  CALCIUM 10.1 9.7  MG 1.5* 1.8   Liver Function Tests:  Recent Labs  11/15/15 0154  AST 16  ALT 14  ALKPHOS 128*  BILITOT 0.3  PROT 7.1  ALBUMIN 3.1*    Recent Labs  11/15/15 0203  TROPIPOC 0.00   BNP:  B NATRIURETIC PEPTIDE  Date/Time Value Ref Range Status  11/15/2015 06:29 AM 62.6 0.0 - 100.0 pg/mL Final  04/29/2015 06:02 PM 150.7* 0.0 - 100.0 pg/mL Final   Thyroid Function Tests:  Recent Labs  11/15/15 0629  TSH 1.830   TELE:  SR      Current Medications:  . amLODipine  5 mg Oral Daily  . apixaban  5 mg Oral BID  . atorvastatin  20 mg Oral Daily  . buPROPion  150 mg Oral BID WC  . clopidogrel  75 mg Oral Daily  . gabapentin  900 mg Oral BID  . insulin glargine  10 Units Subcutaneous QHS  . insulin lispro  20 Units Subcutaneous TID WC  . losartan  50 mg Oral Daily  . metFORMIN  1,000 mg Oral BID WC  . methimazole  10 mg Oral BID  . metoprolol succinate  50 mg Oral Daily  . pantoprazole  40 mg Oral Daily  . sotalol  80 mg Oral Q12H  . traMADol  50 mg Oral Q12H      ASSESSMENT AND PLAN: 1. Paroxysmal atrial fib with RVR - s/p 3rd ED DCCV in 5 weeks' time. On Eliquis. Diltiazem stopped on admission, metoprolol reduced, and started on sotalol 11/15/15. No further AF thus far. QT appears similar to previous sinus tracings. Continue monitoring with present regimen.   - 5 doses so far, including this am.  - 11/15/15 1:50am EKG reviewed by TT, felt afib w/ abberrant conduction.  2. Acute kidney injury - baseline Cr around 0.8 BUN/Cr 29/1.35 on admit. May have been due to RVR. F/u labs  3. Hypomagnesemia -  Mg level has been repleted and is improved.  4. Hyperthyroidism - normal TFTs.  5. CAD - neg troponin on adm,  neg BNP. Continue Plavix, BB, statin. No recent CP otherwise.  6. HTN - BP improved this AM. Amlodipine added since off dilt, and metoprolol reduced, both due to sotalol initiation.  7. DM: on home rx, add SSI  Signed, Barrett, [address] 10:18 AM 11/17/2015 The patient has been seen in conjunction with Rosaria Ferries, PA-C. All aspects of care have been considered and discussed. The patient has been personally interviewed, examined, and all clinical data has been reviewed.   Maintaining normal sinus rhythm  Loading with sotalol. He has received 48 hours of therapy. If QTC remains within the normal range we'll be able to discharge the patient tomorrow morning

## 2015-11-17 NOTE — Research (Signed)
Apixaban Validation Study (ClinicalTrials.gov Identifier: [id number]) RESEARCH SUBJECT. Purpose: Obtain fresh samples that will be used to assess the performances of STA-Apixaban Calibrator and STA-Apixaban Control in combination with the STA-Liquid Anti-Xa to determine the quantity of apixaban in plasma samples by measurement of its direct anti-Xa activity. Apixaban Validation Study is sponsored by [employer].The fresh samples will collected by completing a one time blood draw on approved patients.   Inclusion Criteria: Must meet AT LEAST one of the following:  weight </= 60kg or >/= 75 years or Hct <39% for female or < 36% for female or renal impairment or co-medication with ASA/NSAIDs, or co-medication with anti-platelet agents.  Apixaban Validation Study Informed Consent   Subject Name: Judith [name]  This patient, Judith [name], has been consented to the above clinical trial according to FDA regulations, GCP guidelines and [employer]'s SOPs. The informed consent form and study design have been explained to this patient by this study coordinator at 09:04 AM on 11/17/2015. The patient demonstrated comprehension of this clinical trial and study requirements/expectations. No study procedures have been initiated before consenting of this patient. The patient was given sufficient time for reading the consent form. All risks, benefits and options have been thoroughly discussed and all questions were answered per the patient's satisfaction. This patient was not coerced in any way to participate in this clinical trial. This patient has voluntarily signed consent version 2.0 at 09:30 AM on 11/17/2015. A copy of the signed consent form was given to the patient and a copy was placed in the subject's medical record. Subject was thanked for their participation in research and contribution to science.  Dennison Mascot, RN Clinical Research Nurse  [place] Office: [phone]

## 2015-11-18 ENCOUNTER — Other Ambulatory Visit: Payer: Self-pay | Admitting: Physician Assistant

## 2015-11-18 ENCOUNTER — Telehealth: Payer: Self-pay | Admitting: Cardiology

## 2015-11-18 ENCOUNTER — Ambulatory Visit: Payer: Self-pay | Admitting: Family Medicine

## 2015-11-18 DIAGNOSIS — Z5181 Encounter for therapeutic drug level monitoring: Secondary | ICD-10-CM

## 2015-11-18 DIAGNOSIS — E871 Hypo-osmolality and hyponatremia: Secondary | ICD-10-CM

## 2015-11-18 DIAGNOSIS — Z79899 Other long term (current) drug therapy: Secondary | ICD-10-CM

## 2015-11-18 DIAGNOSIS — N179 Acute kidney failure, unspecified: Secondary | ICD-10-CM

## 2015-11-18 LAB — BASIC METABOLIC PANEL
Anion gap: 9 (ref 5–15)
BUN: 25 mg/dL — AB (ref 6–20)
CO2: 28 mmol/L (ref 22–32)
Calcium: 9.8 mg/dL (ref 8.9–10.3)
Chloride: 102 mmol/L (ref 101–111)
Creatinine, Ser: 0.98 mg/dL (ref 0.44–1.00)
GFR calc Af Amer: >60 mL/min (ref >60)
GLUCOSE: 117 mg/dL — AB (ref 65–99)
POTASSIUM: 4.9 mmol/L (ref 3.5–5.1)
Sodium: 139 mmol/L (ref 135–145)

## 2015-11-18 LAB — GLUCOSE, CAPILLARY
GLUCOSE-CAPILLARY: 154 mg/dL — AB (ref 65–99)
GLUCOSE-CAPILLARY: 87 mg/dL (ref 65–99)

## 2015-11-18 LAB — MAGNESIUM: Magnesium: 1.7 mg/dL (ref 1.7–2.4)

## 2015-11-18 MED ORDER — AMLODIPINE BESYLATE 5 MG PO TABS: 5.0000 mg | ORAL_TABLET | Freq: Every day | ORAL | Status: DC

## 2015-11-18 MED ORDER — METOPROLOL SUCCINATE ER 25 MG PO TB24: 25.0000 mg | ORAL_TABLET | Freq: Two times a day (BID) | ORAL | Status: DC

## 2015-11-18 MED ORDER — SOTALOL HCL 80 MG PO TABS: 80.0000 mg | ORAL_TABLET | Freq: Two times a day (BID) | ORAL | Status: DC

## 2015-11-18 NOTE — Telephone Encounter (Signed)
New message     TCM appt on  7.19.17 per Rhonda Barrett.

## 2015-11-18 NOTE — Progress Notes (Signed)
Order received to discharge Pt.  Telemetry removed and CCMD notified.  IV removed with catheter intact.  Discharge education given to Pt.  Handout on new medication sotalol printed and given to Pt.  All questions answered.  Pt denies chest pain or sob at discharge.  Pt stable to discharge.

## 2015-11-18 NOTE — Discharge Summary (Signed)
Discharge Summary    Patient ID: Judith [name],  MRN: [id number], DOB/AGE: 12/04/1950 65 y.o.  Admit date: 11/15/2015 Discharge date: 11/18/2015  Primary Care Provider: [place] Primary Cardiologist: Dr Radford Pax  Discharge Diagnoses    Principal Problem:   Paroxysmal atrial fibrillation with rapid ventricular response ([place]) Active Problems:   HTN (hypertension)   Acute kidney injury ([place])   Coronary artery disease involving native coronary artery of native heart without angina pectoris   Hyperthyroidism   Hypomagnesemia   Allergies Allergies  Allergen Reactions  . Novolog [Insulin Aspart] Shortness Of Breath and Other (See Comments)    "breathing problems"  . Codeine Nausea And Vomiting and Other (See Comments)    HIGH DOSES-SEVERE VOMITING  . Iodine Other (See Comments)    MUST HAVE BENADRYL PRIOR TO PROCEDURE AND RIGHT BEFORE TREATMENT TO COUNTERACT REACTION-BLISTERING REACTION DERMATOLOGICAL  . Demerol [Meperidine] Nausea And Vomiting  . Ace Inhibitors Cough  . Neosporin [Neomycin-Bacitracin Zn-Polymyx] Itching and Rash    MAKES REACTIONS WORSE WHEN USING AS PROPHYLACTIC  . Penicillins Itching, Rash and Other (See Comments)    Has patient had a PCN reaction causing immediate rash, facial/tongue/throat swelling, SOB or lightheadedness with hypotension: Yes Has patient had a PCN reaction causing severe rash involving mucus membranes or skin necrosis: No Has patient had a PCN reaction that required hospitalization No Has patient had a PCN reaction occurring within the last 10 years: No If all of the above answers are "NO", then may proceed with Cephalosporin use.  CHEST SIZED RASH AND ITCHING   . Percocet [Oxycodone-Acetaminophen] Rash  . Tape Itching and Rash    Diagnostic Studies/Procedures    CXR _____________   History of Present Illness     65 yo female w/ hx PAF/RVR s/p electrical cardioversion x 3, 2 vessel CAD (CTO of RCA and high grade lesion at  LCX/OM) treated with PCI to LCX/OM in 02/2015, normal LVEF, DM, HTN, tobacco abuse, on methimazole for hyperthyroidism (lowest TSH 0.25; mildly elevated free T3/T4), admitted 07/08 w/ rapid afib>>DCCV>>SR. Now loading Sotalol.  Hospital Course     Consultants: None   Judith [name] has maintained SR since the DCCV in the ER. She was started on Sotalol at 80 mg bid and her ECG/telemetry was followed closely. She had no QT prolongation on the Sotalol.   She continued to maintain SR. Her ECG pre-DCCV had some wide complex tachycardia. This was reviewed carefully and felt to be afib with aberrant conduction. She had no recurrence of this.   Her Diltiazem was stopped due to the sotalol and her metoprolol was reduced. She did not have any significant bradycardia. However, her BP was elevated and amlodipine was added. Her BP improved.   Her BUN/Cr were elevated on admission. She was hydrated and her renal function improved. Her magnesium was low and was supplemented.   Her TSH and FreeT4 were checked and are stable on her current dose of methimazole.   Her CBC is stable and she is having no bleeding issues on Eliquis.  She occasionally feels a brief SOB and has to take a deep breath to feel she is getting enough air. She no longer smokes and is not wheezing. This seems to happen when she is lying in bed. She is to pay more attention to the setting and let us know if sleep apnea might be a problem.   On 07/11, she was seen by Dr Tamala Julian and all data were reviewed. Her ECG was  reviewed and is unchanged. She is maintaining SR. No further inpatient workup is indicated and she is considered stable for discharge, to follow up as an outpatient.  _____________  Discharge Vitals Blood pressure 127/66, pulse 67, temperature 97.9 F (36.6 C), temperature source Oral, resp. rate 18, height 5\' 4"  (1.626 m), weight 181 lb (82.101 kg), SpO2 96 %.  Filed Weights   11/15/15 0420  Weight: 181 lb (82.101 kg)  General:  Well developed, well nourished, female in no acute distress Head: Eyes PERRLA, No xanthomas.   Normocephalic and atraumatic  Lungs: few rales bilaterally, good air exchange Heart: HRRR S1 S2, without MRG.  Pulses are 2+ & equal. No carotid bruit. No JVD. Abdomen: Bowel sounds are present, abdomen soft and non-tender without masses or  hernias noted. Msk: Normal strength and tone for age. Extremities: No clubbing, cyanosis or edema.    Skin:  No rashes or lesions noted. Neuro: Alert and oriented X 3. Psych:  Good affect, responds appropriately   Labs & Radiologic Studies    CBC    Component Value Date/Time   WBC 10.2 11/15/2015 0154   RBC 4.26 11/15/2015 0154   HGB 12.2 11/15/2015 0204   HCT 36.0 11/15/2015 0204   PLT 534* 11/15/2015 0154   MCV 86.4 11/15/2015 0154   MCH 28.6 11/15/2015 0154   MCHC 33.2 11/15/2015 [id number]   Basic Metabolic Panel  Recent Labs  11/17/15 0313 11/18/15 0411  NA 134* 139  K 4.8 4.9  CL 100* 102  CO2 25 28  GLUCOSE 269* 117*  BUN 27* 25*  CREATININE 1.00 0.98  CALCIUM 9.7 9.8  MG 1.8 1.7   Lab Results  Component Value Date   ALT 14 11/15/2015   AST 16 11/15/2015   ALKPHOS 128* 11/15/2015   BILITOT 0.3 11/15/2015   Lab Results  Component Value Date   TSH 1.830 11/15/2015   FREE T4 0.92  11/15/2015   _____________  Dg Chest Portable 1 View 11/15/2015  CLINICAL DATA:  Acute onset of tachycardia, nausea, vomiting and shortness of breath. Initial encounter. EXAM: PORTABLE CHEST 1 VIEW COMPARISON:  Chest radiograph performed 11/01/2015 FINDINGS: The lungs are well-aerated and clear. There is no evidence of focal opacification, pleural effusion or pneumothorax. The cardiomediastinal silhouette is borderline enlarged. No acute osseous abnormalities are seen. IMPRESSION: Borderline cardiomegaly.  Lungs remain grossly clear. Electronically Signed   By: Garald Balding M.D.   On: 11/15/2015 02:23   Disposition   Pt is being discharged home today  in good condition.  Follow-up Plans & Appointments    Follow-up Information    Follow up with [facility] On 11/26/2015.   Specialty:  Cardiology   Why:  See the pharmacist at 10:30 am, please arrive 15 minutes early for paperwork.   Contact information:   [address], [place] [phone]      Follow up with Truitt Merle, NP On 11/26/2015.   Specialties:  Nurse Practitioner, Interventional Cardiology, Cardiology, Radiology   Why:  See provider for Dr Radford Pax at 11:00 am.   Contact information:   [place]. [address] [place] [address] [phone]      Discharge Instructions    Amb referral to AF[facility]    Complete by:  As directed            Discharge Medications   Current Discharge Medication List    CONTINUE these medications which have NOT  CHANGED   Details  albuterol (PROVENTIL HFA) 108 (90 Base) MCG/ACT inhaler Inhale 1-2 puffs into the lungs every 6 (six) hours as needed for wheezing or shortness of breath. Qty: 1 Inhaler, Refills: 0    apixaban (ELIQUIS) 5 MG TABS tablet Take 1 tablet (5 mg total) by mouth 2 (two) times daily. Qty: 60 tablet, Refills: 11    atorvastatin (LIPITOR) 20 MG tablet Take 1 tablet (20 mg total) by mouth daily. Qty: 90 tablet, Refills: 1   Associated Diagnoses: Atherosclerosis of native coronary artery of native heart without angina pectoris; Hyperlipemia    buPROPion (WELLBUTRIN SR) 150 MG 12 hr tablet TAKE ONE TABLET BY MOUTH TWICE DAILY Qty: 60 tablet, Refills: 0    clopidogrel (PLAVIX) 75 MG tablet Take 1 tablet (75 mg total) by mouth daily. Qty: 30 tablet, Refills: 11    cyclobenzaprine (FLEXERIL) 5 MG tablet TAKE ONE TABLET BY MOUTH THREE TIMES DAILY AS NEEDED FOR MUSCLE SPASMS Qty: 30 tablet, Refills: 0    FLUoxetine (PROZAC) 20 MG capsule Take 1 capsule (20 mg total) by mouth daily. Qty: 30 capsule, Refills: 0    gabapentin (NEURONTIN) 300 MG  capsule TAKE 3 CAPSULES BY MOUTH AT LUNCHTIME AND 3 CAPSULES AT BEDTIME Qty: 180 capsule, Refills: 1    Insulin Glargine (BASAGLAR KWIKPEN) 100 UNIT/ML SOPN Inject 0.1 mLs (10 Units total) into the skin at bedtime. Qty: 15 mL, Refills: 11    insulin lispro (HUMALOG) 100 UNIT/ML injection Inject 0.2 mLs (20 Units total) into the skin 3 (three) times daily with meals. Qty: 30 mL, Refills: 11    losartan (COZAAR) 50 MG tablet Take 1 tablet (50 mg total) by mouth daily. Qty: 30 tablet, Refills: 11   Associated Diagnoses: Coronary artery disease involving native coronary artery of native heart without angina pectoris    metFORMIN (GLUCOPHAGE) 1000 MG tablet Take 1 tablet (1,000 mg total) by mouth 2 (two) times daily with a meal. Qty: 180 tablet, Refills: 1    methimazole (TAPAZOLE) 10 MG tablet Take 1 tablet (10 mg total) by mouth 2 (two) times daily. Qty: 60 tablet, Refills: 11    metoprolol succinate (TOPROL-XL) 25 MG 24 hr tablet Take 3 tablets (75 mg total) by mouth daily. Take with or immediately following a meal. Qty: 90 tablet, Refills: 11   Associated Diagnoses: Coronary artery disease involving native coronary artery of native heart without angina pectoris    pantoprazole (PROTONIX) 40 MG tablet Take 1 tablet (40 mg total) by mouth daily. Qty: 30 tablet, Refills: 11    traMADol (ULTRAM) 50 MG tablet Take 50-100 mg by mouth 2 (two) times daily. Usually takes 1 tab in am as needed, and 2 tablets at bedtime    nitroGLYCERIN (NITROSTAT) 0.4 MG SL tablet Place 1 tablet (0.4 mg total) under the tongue every 5 (five) minutes as needed for chest pain. Qty: 25 tablet, Refills: 3   Associated Diagnoses: Chest pain, unspecified chest pain type; Abnormal nuclear stress test    ondansetron (ZOFRAN) 4 MG tablet Take 1 tablet (4 mg total) by mouth every 8 (eight) hours as needed for nausea or vomiting. Qty: 20 tablet, Refills: 0          Outstanding Labs/Studies   None   Duration of  Discharge Encounter   Greater than 30 minutes including physician time.  Jonetta Speak NP 11/18/2015, 11:50 AM  The patient has been seen in conjunction with Rosaria Ferries, PA-C. All aspects of care have been  considered and discussed. The patient has been personally interviewed, examined, and all clinical data has been reviewed.   The patient is been loaded with sotalol. She has maintaining normal sinus rhythm. QTc this morning is 465 Judith. This is not significantly changed compared to admission although will need to be followed closely.  Plan discharge today on reduced dose metoprolol, which may eventually need to be discontinued if bradycardia. The sotalol dose is 80 mg twice daily.  Needs early office follow-up with EKG and electrolytes.

## 2015-11-19 NOTE — Telephone Encounter (Signed)
Patient contacted regarding discharge from [facility] on November 18, 2015.  Patient understands to follow up with provider Truitt Merle, NP on November 26, 2015 at [place] [facility]. Patient understands discharge instructions? yes Patient understands medications and regiment? yes Patient understands to bring all medications to this visit? yes  Pt aware of appt with Pharmacy at 10:30AM as well.

## 2015-11-21 ENCOUNTER — Ambulatory Visit (INDEPENDENT_AMBULATORY_CARE_PROVIDER_SITE_OTHER): Payer: Managed Care, Other (non HMO) | Admitting: Cardiology

## 2015-11-21 ENCOUNTER — Encounter: Payer: Self-pay | Admitting: Cardiology

## 2015-11-21 VITALS — BP 138/58 | HR 79 | Ht 64.0 in | Wt 179.8 lb

## 2015-11-21 DIAGNOSIS — I10 Essential (primary) hypertension: Secondary | ICD-10-CM | POA: Diagnosis not present

## 2015-11-21 DIAGNOSIS — I2510 Atherosclerotic heart disease of native coronary artery without angina pectoris: Secondary | ICD-10-CM | POA: Diagnosis not present

## 2015-11-21 DIAGNOSIS — I480 Paroxysmal atrial fibrillation: Secondary | ICD-10-CM | POA: Diagnosis not present

## 2015-11-21 DIAGNOSIS — E785 Hyperlipidemia, unspecified: Secondary | ICD-10-CM

## 2015-11-21 NOTE — Progress Notes (Signed)
Cardiology Office Note    Date:  11/21/2015   ID:  Judith [name], DOB [birth year], MRN [id number]  PCP:  Reginia Forts, MD  Cardiologist:  Fransico Him, MD   Chief Complaint  Patient presents with  . Atrial Fibrillation  . Hypertension  . Coronary Artery Disease    History of Present Illness:  Judith [name] is a 65 y.o. female with a history of HTN, ASCAD with CTO of the RCA and high grade LCx/OM stenosis s/p DES x 2, hyperlipidemia, Type II DM, Hyperthyroidism on Methimazole, PAF on apixaban, who was recently admitted to [facility] for palpitations and was found to be in atrial fibrillation with RVR requiring DCCV in the ER.  Since this was her 3rd time in a short period of time she was loaded with Sotolol.  She is doing well.  She denies any further SOB or palpitations.  She denies any chest pain or pressure, LE edema, dizziness, palpitations or syncope. Since going on Sotolol she has noticed some swelling in her fingers and tingling.      Past Medical History  Diagnosis Date  . Hypertension   . Goiter   . Chronic kidney disease   . History of shingles 06/01/2013  . Abnormal EKG 07/31/2013  . GERD (gastroesophageal reflux disease)   . Carpal tunnel syndrome, bilateral   . Coronary artery disease      2 v CAD with CTO of the RCA and high grade bifurcational LCx/OM stenosis. S/P PCI DES x 2 to the LCx/OM.  [place] Kitchen Hyperlipidemia   . Asthma   . Pneumonia ~ 1976  . Type II diabetes mellitus (HCC)     insulin dependent  . Arthritis     "hands" (03/06/2015)  . Diabetic peripheral neuropathy ([place]) "since 1996"  . Carpal tunnel syndrome, bilateral   . Tremors of nervous system   . PAF (paroxysmal atrial fibrillation) ([place]) 04/29/2015    CHADS2VASC score of 4 now on Apixaban  . Hyperlipidemia LDL goal <70 10/13/2015  . Paroxysmal atrial fibrillation with rapid ventricular response ([place]) 11/15/2015    Past Surgical History  Procedure Laterality Date  . Tonsillectomy  1976  . Cesarean section   1982; 1984  . Carpal tunnel release Right Nov 2015  . Knee arthroscopy Right ~ 2003    "meniscus repair"  . Shoulder open rotator cuff repair Right 1996; 1998    "w/fracture repair"  . Foot neuroma surgery Bilateral 2000  . Carpal tunnel release Right 1992; 05/2014    [name]; [place]  . Thyroid surgery  2000    "removed lots of nodules"  . Cardiac catheterization N/A 02/27/2015    Procedure: Left Heart Cath and Coronary Angiography;  Surgeon: [name], MD; LAD 40%, mCFX 80%, OM 70%, RCA 100% calcified       . Cardiac catheterization N/A 03/06/2015    Procedure: Coronary Stent Intervention;  Surgeon: [name], MD;  Location: [facility];  Service: Cardiovascular;  Laterality: N/A;  Mid CX 3.50x12 promus DES w/ 0% resdual and Prox OM1 2.50x20 promus DES w/ 20% residual  . Abdominal hysterectomy  1988    age 6; CERVICAL DYSPLASIA; ovaries intact.     Current Medications: Outpatient Prescriptions Prior to Visit  Medication Sig Dispense Refill  . albuterol (PROVENTIL HFA) 108 (90 Base) MCG/ACT inhaler Inhale 1-2 puffs into the lungs every 6 (six) hours as needed for wheezing or shortness of breath. 1 Inhaler 0  . amLODipine (NORVASC) 5 MG tablet Take 1 tablet (  5 mg total) by mouth daily. 30 tablet 11  . apixaban (ELIQUIS) 5 MG TABS tablet Take 1 tablet (5 mg total) by mouth 2 (two) times daily. 60 tablet 11  . atorvastatin (LIPITOR) 20 MG tablet Take 1 tablet (20 mg total) by mouth daily. 90 tablet 1  . buPROPion (WELLBUTRIN SR) 150 MG 12 hr tablet TAKE ONE TABLET BY MOUTH TWICE DAILY 60 tablet 0  . clopidogrel (PLAVIX) 75 MG tablet Take 1 tablet (75 mg total) by mouth daily. 30 tablet 11  . FLUoxetine (PROZAC) 20 MG capsule Take 1 capsule (20 mg total) by mouth daily. (Patient taking differently: Take 20 mg by mouth at bedtime. ) 30 capsule 0  . gabapentin (NEURONTIN) 300 MG capsule TAKE 3 CAPSULES BY MOUTH AT LUNCHTIME AND 3 CAPSULES AT BEDTIME (Patient taking  differently: TAKE 3 CAPSULES BY MOUTH AT morning AND 3 CAPSULES AT BEDTIME) 180 capsule 1  . Insulin Glargine (BASAGLAR KWIKPEN) 100 UNIT/ML SOPN Inject 0.1 mLs (10 Units total) into the skin at bedtime. (Patient taking differently: Inject 12 Units into the skin daily at 10 pm. ) 15 mL 11  . insulin lispro (HUMALOG) 100 UNIT/ML injection Inject 0.2 mLs (20 Units total) into the skin 3 (three) times daily with meals. 30 mL 11  . losartan (COZAAR) 50 MG tablet Take 1 tablet (50 mg total) by mouth daily. 30 tablet 11  . metFORMIN (GLUCOPHAGE) 1000 MG tablet Take 1 tablet (1,000 mg total) by mouth 2 (two) times daily with a meal. 180 tablet 1  . methimazole (TAPAZOLE) 10 MG tablet Take 1 tablet (10 mg total) by mouth 2 (two) times daily. 60 tablet 11  . metoprolol succinate (TOPROL-XL) 25 MG 24 hr tablet Take 1 tablet (25 mg total) by mouth 2 (two) times daily. Take with or immediately following a meal. 60 tablet 11  . nitroGLYCERIN (NITROSTAT) 0.4 MG SL tablet Place 1 tablet (0.4 mg total) under the tongue every 5 (five) minutes as needed for chest pain. 25 tablet 3  . ondansetron (ZOFRAN) 4 MG tablet Take 1 tablet (4 mg total) by mouth every 8 (eight) hours as needed for nausea or vomiting. 20 tablet 0  . pantoprazole (PROTONIX) 40 MG tablet Take 1 tablet (40 mg total) by mouth daily. 30 tablet 11  . sotalol (BETAPACE) 80 MG tablet Take 1 tablet (80 mg total) by mouth every 12 (twelve) hours. 60 tablet 11  . traMADol (ULTRAM) 50 MG tablet Take 50-100 mg by mouth 2 (two) times daily. Usually takes 1 tab in am as needed, and 2 tablets at bedtime    . cyclobenzaprine (FLEXERIL) 5 MG tablet TAKE ONE TABLET BY MOUTH THREE TIMES DAILY AS NEEDED FOR MUSCLE SPASMS (Patient not taking: Reported on 11/21/2015) 30 tablet 0   No facility-administered medications prior to visit.     Allergies:   Novolog; Codeine; Iodine; Demerol; Ace inhibitors; Neosporin; Penicillins; Percocet; and Tape   Social History    Social History  . Marital Status: Divorced    Spouse Name: N/A  . Number of Children: 2  . Years of Education: Masters   Occupational History  . Food Therapist, nutritional    Social History Main Topics  . Smoking status: Former Smoker -- 0.00 packs/day for 41 years    Types: Cigarettes  . Smokeless tobacco: Never Used     Comment: 04/29/2015 "quit smoking cigarettes 02/27/2015"  . Alcohol Use: No  . Drug Use: No  . Sexual Activity: Not Currently  Birth Control/ Protection: Post-menopausal, Surgical   Other Topics Concern  . 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      Lives with sister in [place].      Tobacco: 1 ppd x 41 years - quit 2016      Alcohol: none      Drugs: none      Exercise:  Walking in neighborhood; physical job.   Right-handed.   2 cups caffeine daily.        Family History:  The patient's family history includes Allergies in her sister; Breast cancer in her maternal grandmother and mother; COPD in her father; Cancer (age of onset: 104) in her mother; Emphysema in her maternal grandfather and paternal grandfather; Heart attack in her father; Heart disease (age of onset: 79) in her father; Hypertension in her father; Leukemia in her paternal grandmother; Lung cancer in her mother; Parkinson's disease in her father. There is no history of Thyroid disease.   ROS:   Please see the history of present illness.    ROS All other systems reviewed and are negative.   PHYSICAL EXAM:   VS:  BP 138/58 mmHg  Pulse 79  Ht 5\' 4"  (1.626 m)  Wt 179 lb 12.8 oz (81.557 kg)  BMI 30.85 kg/m2   GEN: Well nourished, well developed, in no acute distress HEENT: normal Neck: no JVD, carotid bruits, or masses Cardiac: RRR; no murmurs, rubs, or gallops,no  edema.  Intact distal pulses bilaterally.  Respiratory:  clear to auscultation bilaterally, normal work of breathing GI: soft, nontender, nondistended, + BS MS: no deformity or atrophy Skin: warm and dry, no rash Neuro:  Alert and Oriented x 3, Strength and sensation are intact Psych: euthymic mood, full affect  Wt Readings from Last 3 Encounters:  11/21/15 179 lb 12.8 oz (81.557 kg)  11/15/15 181 lb (82.101 kg)  10/31/15 178 lb (80.74 kg)      Studies/Labs Reviewed:   EKG:  EKG is not ordered today.    Recent Labs: 11/15/2015: ALT 14; B Natriuretic Peptide 62.6; Hemoglobin 12.2; Platelets 534*; TSH 1.830 11/18/2015: BUN 25*; Creatinine, Ser 0.98; Magnesium 1.7; Potassium 4.9; Sodium 139   Lipid Panel    Component Value Date/Time   CHOL 171 05/30/2015 1249   TRIG 334* 05/30/2015 1249   HDL 39* 05/30/2015 1249   CHOLHDL 4.4 05/30/2015 1249   VLDL 67* 05/30/2015 1249   LDLCALC 65 05/30/2015 1249    Additional studies/ records that were reviewed today include:  Hospital records    ASSESSMENT:    1. PAF (paroxysmal atrial fibrillation) ([place])   2. Coronary artery disease involving native coronary artery of native heart without angina pectoris   3. Essential hypertension   4. Hyperlipidemia LDL goal <70      PLAN:  In order of problems listed above:  1. PAF s/p recent DCCV and now maintaining NSR on Sotolol.  QTC 437msec on EKG today.  Continue Sotolol, BB and Apixaban.  I reviewed her symptoms of diarrhea/finger tingling and isolated swelling of finger tips with the pharmacist who feels iit is most likely not related to the [place].  2. ASCAD with CTO of the  of the RCA and high grade LCx/OM stenosis s/p DES x 2.  She is not on ASA due to Plavix and Apixaban.  Continue statin and BB. 3. HTN - BP controlled on current medical therapy.  Continue amlodipine/ARB amd BB. 4. Hyperlipidemia with LDL goal < 70.  Continue statin. Recheck FLP and ALT.    Medication  Adjustments/Labs and Tests Ordered: Current medicines are reviewed at length with the patient today.  Concerns regarding medicines are outlined above.  Medication changes, Labs and Tests ordered today are listed in the Patient Instructions below.  There are no Patient Instructions on file for this visit.   Signed, Fransico Him, MD  11/21/2015 2:13 PM    [facility] [address], [place], [place]  [address] Phone: [phone]; Fax: [phone]

## 2015-11-21 NOTE — Patient Instructions (Signed)
Medication Instructions:  Your physician recommends that you continue on your current medications as directed. Please refer to the Current Medication list given to you today.   Labwork: None  Testing/Procedures: None  Follow-Up: Your physician wants you to follow-up in: 6 months with Dr. Turner. You will receive a reminder letter in the mail two months in advance. If you don't receive a letter, please call our office to schedule the follow-up appointment.   Any Other Special Instructions Will Be Listed Below (If Applicable).     If you need a refill on your cardiac medications before your next appointment, please call your pharmacy.   

## 2015-11-26 ENCOUNTER — Encounter: Payer: Self-pay | Admitting: Nurse Practitioner

## 2015-11-26 ENCOUNTER — Ambulatory Visit (INDEPENDENT_AMBULATORY_CARE_PROVIDER_SITE_OTHER): Payer: Managed Care, Other (non HMO) | Admitting: Neurology

## 2015-11-26 ENCOUNTER — Encounter: Payer: Self-pay | Admitting: Neurology

## 2015-11-26 VITALS — BP 164/84 | HR 72 | Ht 64.0 in | Wt 180.0 lb

## 2015-11-26 DIAGNOSIS — G4733 Obstructive sleep apnea (adult) (pediatric): Secondary | ICD-10-CM | POA: Diagnosis not present

## 2015-11-26 DIAGNOSIS — G5603 Carpal tunnel syndrome, bilateral upper limbs: Secondary | ICD-10-CM | POA: Insufficient documentation

## 2015-11-26 DIAGNOSIS — Z720 Tobacco use: Secondary | ICD-10-CM

## 2015-11-26 DIAGNOSIS — E1142 Type 2 diabetes mellitus with diabetic polyneuropathy: Secondary | ICD-10-CM | POA: Diagnosis not present

## 2015-11-26 NOTE — Progress Notes (Signed)
Chief Complaint  Patient presents with  . Peripheral Neuropathy    She is still having problems with burning pain in her hands and feet.    . Mild Cognitive Impairment    MMSE 29/39 - 20 animals.  Feels her memory is unchanged.      PATIENT: Judith [name] DOB: [birth year]  Chief Complaint  Patient presents with  . Peripheral Neuropathy    She is still having problems with burning pain in her hands and feet.    . Mild Cognitive Impairment    MMSE 29/39 - 20 animals.  Feels her memory is unchanged.     HISTORICAL  Judith [name] is a 65 years old right-handed female, seen in refer by her primary care physician Dr. [name] on August 12 2015 for evaluation of bilateral hands tremor, falling episode, and memory loss, she is accompanied by [name]   She had a history of hypertension, hyperlipidemia, diabetes for more than 20 years, atrial fibrillation, on chronic Eliquis treatment, coronary artery disease, status post stent, chronic kidney disease, she is a retired Education officer, museum, now works for [employer], including heavy lifting  She was noted to have memory loss over the past few years, she needs to be reminded about appointment, had word finding difficulties, she still drives without getting loss, she lives with her sister.  Her father has Parkinson's disease, she noted bilateral hands shaking since 2012, getting worse holding small subject, writing, her sister also suffered similar bilateral hands shaking.  She had long-standing history of diabetic peripheral neuropathy, complains of bilateral feet numbness tingling, taking gabapentin and the muscle relaxant.  She had a few fall accident, she also felt lightheaded dizzy when she first got up from overnight sleep, occasionally falling down when she get up quickly after prolonged sitting, she denies significant gait difficulty, no bowel and bladder incontinence, over the years, she had worsening progressing  ascending paresthesia, now has fingertips numbness tingling, mild bilateral hands weakness, mild difficulty opening bottles.   Laboratory evaluation showed mild low TSH, she is going through treatment for hyperthyroidism, low normal B12 290, vitamin D was mildly low 28, normal CMP, with exception of elevated glucose, 311, CBC, A1c was elevated 8.2  I have personally reviewed CAT scan in January 2017, CAT scan of the brain, mild generalized atrophy, periventricular small vessel disease, calcification at left thalamus   CAT scan of cervical spine, multilevel mild degenerative changes.  UPDATE July 19th 2017: She drove herself to clinic today, she continues to have mild memory loss, she still has mild bilateral hand tremor, consistent with essential tremor, she has DM peripheral neuropathy.  She now complains of bilateral feet and hand paresthesia,   I also reviewed her most recent cardiology admission in July, she presented with paroxysmal atrial fibrillation with rapid ventricular rate, had cardial conversion, she is doing well now, x-ray of chest was normal, laboratory evaluation showed normal magnesium 1.7, mild elevated glucose,  Today she also reported snoring, frequent wakening at nighttime, excessive daytime sleepiness and fatigue, ESS score is 12.  REVIEW OF SYSTEMS: Full 14 system review of systems performed and notable only for restless leg, snoring, sleep talking ALLERGIES: Allergies  Allergen Reactions  . Novolog [Insulin Aspart] Shortness Of Breath and Other (See Comments)    "breathing problems"  . Codeine Nausea And Vomiting and Other (See Comments)    HIGH DOSES-SEVERE VOMITING  . Iodine Other (See Comments)    MUST HAVE BENADRYL PRIOR  TO PROCEDURE AND RIGHT BEFORE TREATMENT TO COUNTERACT REACTION-BLISTERING REACTION DERMATOLOGICAL  . Demerol [Meperidine] Nausea And Vomiting  . Propofol Other (See Comments)    "Breathing problems - asthma attack"  . Ace Inhibitors Cough  .  Neosporin [Neomycin-Bacitracin Zn-Polymyx] Itching and Rash    MAKES REACTIONS WORSE WHEN USING AS PROPHYLACTIC  . Penicillins Itching, Rash and Other (See Comments)    Has patient had a PCN reaction causing immediate rash, facial/tongue/throat swelling, SOB or lightheadedness with hypotension: Yes Has patient had a PCN reaction causing severe rash involving mucus membranes or skin necrosis: No Has patient had a PCN reaction that required hospitalization No Has patient had a PCN reaction occurring within the last 10 years: No If all of the above answers are "NO", then may proceed with Cephalosporin use.  CHEST SIZED RASH AND ITCHING   . Percocet [Oxycodone-Acetaminophen] Rash  . Tape Itching and Rash    HOME MEDICATIONS: Current Outpatient Prescriptions  Medication Sig Dispense Refill  . albuterol (PROVENTIL HFA) 108 (90 Base) MCG/ACT inhaler Inhale 1-2 puffs into the lungs every 6 (six) hours as needed for wheezing or shortness of breath. 1 Inhaler 0  . amLODipine (NORVASC) 5 MG tablet Take 1 tablet (5 mg total) by mouth daily. 30 tablet 11  . apixaban (ELIQUIS) 5 MG TABS tablet Take 1 tablet (5 mg total) by mouth 2 (two) times daily. 60 tablet 11  . atorvastatin (LIPITOR) 20 MG tablet Take 1 tablet (20 mg total) by mouth daily. 90 tablet 1  . buPROPion (WELLBUTRIN SR) 150 MG 12 hr tablet TAKE ONE TABLET BY MOUTH TWICE DAILY 60 tablet 0  . clindamycin (CLEOCIN) 300 MG capsule Take 300 mg by mouth as directed. Before dental appointments    . clopidogrel (PLAVIX) 75 MG tablet Take 1 tablet (75 mg total) by mouth daily. 30 tablet 11  . FLUoxetine (PROZAC) 20 MG capsule Take 1 capsule (20 mg total) by mouth daily. (Patient taking differently: Take 20 mg by mouth at bedtime. ) 30 capsule 0  . fluticasone (FLOVENT HFA) 44 MCG/ACT inhaler Inhale 2 puffs into the lungs 2 (two) times daily.    Marland Kitchen gabapentin (NEURONTIN) 300 MG capsule TAKE 3 CAPSULES BY MOUTH AT LUNCHTIME AND 3 CAPSULES AT BEDTIME  (Patient taking differently: TAKE 3 CAPSULES BY MOUTH AT morning AND 3 CAPSULES AT BEDTIME) 180 capsule 1  . ibuprofen (ADVIL,MOTRIN) 200 MG tablet Take 200 mg by mouth every 6 (six) hours as needed.    . Insulin Glargine (BASAGLAR KWIKPEN) 100 UNIT/ML SOPN Inject 0.1 mLs (10 Units total) into the skin at bedtime. (Patient taking differently: Inject 12 Units into the skin daily at 10 pm. ) 15 mL 11  . insulin lispro (HUMALOG) 100 UNIT/ML injection Inject 0.2 mLs (20 Units total) into the skin 3 (three) times daily with meals. 30 mL 11  . Loperamide HCl (ANTI-DIARRHEAL PO) Take by mouth as directed.    Marland Kitchen losartan (COZAAR) 50 MG tablet Take 1 tablet (50 mg total) by mouth daily. 30 tablet 11  . metFORMIN (GLUCOPHAGE) 1000 MG tablet Take 1 tablet (1,000 mg total) by mouth 2 (two) times daily with a meal. 180 tablet 1  . methimazole (TAPAZOLE) 10 MG tablet Take 1 tablet (10 mg total) by mouth 2 (two) times daily. 60 tablet 11  . metoprolol succinate (TOPROL-XL) 25 MG 24 hr tablet Take 1 tablet (25 mg total) by mouth 2 (two) times daily. Take with or immediately following a meal. 60 tablet  11  . nitroGLYCERIN (NITROSTAT) 0.4 MG SL tablet Place 1 tablet (0.4 mg total) under the tongue every 5 (five) minutes as needed for chest pain. 25 tablet 3  . ondansetron (ZOFRAN) 4 MG tablet Take 1 tablet (4 mg total) by mouth every 8 (eight) hours as needed for nausea or vomiting. 20 tablet 0  . pantoprazole (PROTONIX) 40 MG tablet Take 1 tablet (40 mg total) by mouth daily. 30 tablet 11  . sotalol (BETAPACE) 80 MG tablet Take 1 tablet (80 mg total) by mouth every 12 (twelve) hours. 60 tablet 11  . traMADol (ULTRAM) 50 MG tablet Take 50-100 mg by mouth 2 (two) times daily. Usually takes 1 tab in am as needed, and 2 tablets at bedtime     No current facility-administered medications for this visit.    PAST MEDICAL HISTORY: Past Medical History  Diagnosis Date  . Hypertension   . Goiter   . Chronic kidney  disease   . History of shingles 06/01/2013  . Abnormal EKG 07/31/2013  . GERD (gastroesophageal reflux disease)   . Carpal tunnel syndrome, bilateral   . Coronary artery disease      2 v CAD with CTO of the RCA and high grade bifurcational LCx/OM stenosis. S/P PCI DES x 2 to the LCx/OM.  Marland Kitchen Hyperlipidemia   . Asthma   . Pneumonia ~ 1976  . Type II diabetes mellitus (HCC)     insulin dependent  . Arthritis     "hands" (03/06/2015)  . Diabetic peripheral neuropathy ([place]) "since 1996"  . Carpal tunnel syndrome, bilateral   . Tremors of nervous system   . PAF (paroxysmal atrial fibrillation) ([place]) 04/29/2015    CHADS2VASC score of 4 now on Apixaban  . Hyperlipidemia LDL goal <70 10/13/2015  . Paroxysmal atrial fibrillation with rapid ventricular response ([place]) 11/15/2015    PAST SURGICAL HISTORY: Past Surgical History  Procedure Laterality Date  . Tonsillectomy  1976  . Cesarean section  1982; 1984  . Carpal tunnel release Right Nov 2015  . Knee arthroscopy Right ~ 2003    "meniscus repair"  . Shoulder open rotator cuff repair Right 1996; 1998    "w/fracture repair"  . Foot neuroma surgery Bilateral 2000  . Carpal tunnel release Right 1992; 05/2014    [name]; [place]  . Thyroid surgery  2000    "removed lots of nodules"  . Cardiac catheterization N/A 02/27/2015    Procedure: Left Heart Cath and Coronary Angiography;  Surgeon: [name], MD; LAD 40%, mCFX 80%, OM 70%, RCA 100% calcified       . Cardiac catheterization N/A 03/06/2015    Procedure: Coronary Stent Intervention;  Surgeon: [name], MD;  Location: [facility];  Service: Cardiovascular;  Laterality: N/A;  Mid CX 3.50x12 promus DES w/ 0% resdual and Prox OM1 2.50x20 promus DES w/ 20% residual  . Abdominal hysterectomy  1988    age 18; CERVICAL DYSPLASIA; ovaries intact.     FAMILY HISTORY: Family History  Problem Relation Age of Onset  . Cancer Mother 51    bronchial cancer  . Hypertension Father    . COPD Father   . Heart disease Father 18    CAD with cardiac stenting  . Heart attack Father   . Allergies Sister   . Breast cancer Maternal Grandmother   . Emphysema Maternal Grandfather   . Leukemia Paternal Grandmother   . Emphysema Paternal Grandfather   . Thyroid disease Neg Hx   .  Breast cancer Mother   . Lung cancer Mother   . Parkinson's disease Father     SOCIAL HISTORY:  Social History   Social History  . Marital Status: Divorced    Spouse Name: N/A  . Number of Children: 2  . Years of Education: Masters   Occupational History  . Food Therapist, nutritional    Social History Main Topics  . Smoking status: Former Smoker -- 0.00 packs/day for 41 years    Types: Cigarettes  . Smokeless tobacco: Never Used     Comment: 04/29/2015 "quit smoking cigarettes 02/27/2015"  . Alcohol Use: No  . Drug Use: No  . Sexual Activity: Not Currently    Birth Control/ Protection: Post-menopausal, Surgical   Other Topics Concern  . 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      Lives with sister in [place].      Tobacco: 1 ppd x 41 years - quit 2016      Alcohol: none      Drugs: none      Exercise:  Walking in neighborhood; physical job.   Right-handed.   2 cups caffeine daily.        PHYSICAL EXAM   Filed Vitals:   11/26/15 0742  BP: 164/84  Pulse: 72  Height: 5\' 4"  (1.626 m)  Weight: 180 lb (81.647 kg)    Not recorded      Body mass index is 30.88 kg/(m^2).  PHYSICAL EXAMNIATION:  Gen: NAD, conversant, well nourised, obese, well groomed                     Cardiovascular: Regular rate rhythm, no peripheral edema, warm, nontender. Eyes: Conjunctivae clear without exudates or hemorrhage Neck: Supple, no carotid  bruise. Pulmonary: Clear to auscultation bilaterally   NEUROLOGICAL EXAM:  MENTAL STATUS: Speech:    Speech is normal; fluent and spontaneous with normal comprehension.  Cognition:Mini-Mental Status Examination 29 /30     Orientation to time, place and person     Recent and remote memory: She missed one out of 3 recall     Attention span and concentration: Normal     Normal Language, naming, repeating,spontaneous speech     Fund of knowledge   CRANIAL NERVES: CN II: Visual fields are full to confrontation. Fundoscopic exam is normal with sharp discs and no vascular changes. Pupils are round equal and briskly reactive to light. CN III, IV, VI: extraocular movement are normal. No ptosis. CN V: Facial sensation is intact to pinprick in all 3 divisions bilaterally. Corneal responses are intact.  CN VII: Face is symmetric with normal eye closure and smile. CN VIII: Hearing is normal to rubbing fingers CN IX, X: Palate elevates symmetrically. Phonation is normal. CN XI: Head turning and shoulder shrug are intact CN XII: Tongue is midline with normal movements and no atrophy.  MOTOR: There is no pronator drift of out-stretched arms. Muscle bulk and tone are normal. Muscle strength is normal.  REFLEXES: Reflexes are 2+ and symmetric at the biceps, triceps, knees, and absent at  ankles. Plantar responses are flexor.  SENSORY: Length dependent decreased to light touch, pinprick, positional sensation and vibratory sensation to midshin level  COORDINATION: Rapid alternating movements and fine  finger movements are intact. There is no dysmetria on finger-to-nose and heel-knee-shin.    GAIT/STANCE: She needs to push up to get up from seated position, mildly unsteady, cautious gait, has difficulty with tandem walking   DIAGNOSTIC DATA (LABS, IMAGING, TESTING) - I reviewed patient records, labs, notes, testing and imaging myself where available.   ASSESSMENT AND PLAN  Omni Kladis is a  65 y.o. female   Mild cognitive impairment  Mini-Mental Status Examination 68 /30  Likely combination of central  nervous system degenerative disorder,with  a vascular component  No treatable cause find by laboratory evaluation  I have advised her tight control vascular risk factor, especially her diabetes,   Moderate exercise , I discussed medication choices with her, she decided not to proceed with any medication treatment at this point   Diabetic peripheral neuropathy  Orthostatic blood pressure changes will explain her complaints of dizziness with sudden positional change, frequent falling  EMG nerve conduction study confirmed mild axonal peripheral neuropathy, moderate bilateral carpal tunnel syndromes  Obstructive sleep apnea  ESS score is 12, refer her to sleep study     Marcial Pacas, M.D. Ph.D.  [facility] [address], [place], [place] [address] Ph: [phone] Fax: [phone]  CC: [name], MD

## 2015-12-04 ENCOUNTER — Ambulatory Visit: Payer: Managed Care, Other (non HMO)

## 2015-12-09 ENCOUNTER — Ambulatory Visit (INDEPENDENT_AMBULATORY_CARE_PROVIDER_SITE_OTHER): Payer: Managed Care, Other (non HMO) | Admitting: Neurology

## 2015-12-09 ENCOUNTER — Encounter: Payer: Self-pay | Admitting: Neurology

## 2015-12-09 VITALS — BP 110/70 | HR 80 | Resp 20 | Ht 64.0 in | Wt 181.0 lb

## 2015-12-09 DIAGNOSIS — I4891 Unspecified atrial fibrillation: Secondary | ICD-10-CM

## 2015-12-09 DIAGNOSIS — G4710 Hypersomnia, unspecified: Secondary | ICD-10-CM | POA: Diagnosis not present

## 2015-12-09 DIAGNOSIS — I4892 Unspecified atrial flutter: Secondary | ICD-10-CM | POA: Diagnosis not present

## 2015-12-09 DIAGNOSIS — R0683 Snoring: Secondary | ICD-10-CM | POA: Diagnosis not present

## 2015-12-09 DIAGNOSIS — E669 Obesity, unspecified: Secondary | ICD-10-CM | POA: Diagnosis not present

## 2015-12-09 DIAGNOSIS — G4730 Sleep apnea, unspecified: Secondary | ICD-10-CM | POA: Diagnosis not present

## 2015-12-09 NOTE — Progress Notes (Signed)
SLEEP MEDICINE CLINIC   Provider:  Larey Seat, M D  Referring Provider: [name], MD Primary Care Physician:  Reginia Forts, MD  Chief Complaint  Patient presents with  . New Patient (Initial Visit)    sleeping all the time    HPI:  Judith [name] is a 65 y.o. female , seen here as a referral for a sleep consultation,   Judith [name] was recently evaluated in hospital and every time her cardiologist came on rounds he found her asleep, it was suggested that she undergoes a sleep evaluation for hypersomnia also because she had atrial fibrillation and is chronically anticoagulated. Atrial fibrillation has a strong link to obstructive sleep apnea. The patient had seen my colleague, Dr. Krista Blue, for tremor and memory loss.   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 She endorsed sleeping a lot, but she usually wakes up without hesitation in the morning rises and starts her day. She does not think that her sleep has a low quality or is not refreshing and restoring. But just during the day she needs more of it. As long as she is physically active and mentally stimulated she will not not off but when she comes home from work she would like to take a nap and finds herself asleep for 2 or 3 hours on stretch. She is unaware if she is snoring a lot as she lives alone. She has been divorced for 7 years but during her hospital stay doctors and nurses mention to her that she actually snores.   Sleep habits are as follows: She usually goes to bed at 9 PM and is falling asleep within minutes, she has up to 4 bathroom breaks at night fragmented in her sleep. She rises in the morning at 6 AM and again once the alarm rings she is ready to go and feels energized. She lives alone, sleeps alone. She falls asleep on her side, usually using 4 pillows. Cool , quiet and dark bedroom.   Sleep medical history and family sleep history:   No known sleep apnea, her  younger brother died of an acute heart attack at age 69, her sister Judith [name] is one year her junior has a pacemaker and her brother Judith [name] 66 is in very good health.  Social history: divorced, 2 adult children. Ex smoker, Nov 2016 quit. ETOH none, caffeine intake, 1 cup in AM.   History by Dr Krista Blue ; July 19th 2017  Judith [name] is a 65 years old right-handed female, seen in refer by her primary care physician Dr. [name] on August 12 2015 for evaluation of bilateral hands tremor, falling episode, and memory loss, she is accompanied by her sister Judith [name]. She had a history of hypertension, hyperlipidemia, diabetes for more than 20 years, atrial fibrillation, on chronic Eliquis treatment, coronary artery disease, status post stent, chronic kidney disease, she is a retired Education officer, museum, now works for [employer], including heavy lifting. She was noted to have memory loss over the past few years, she needs to be reminded about appointment, had word finding difficulties, she still drives without getting loss, she lives with her sister. Her father has Parkinson's disease, she noted bilateral hands shaking since 2012, getting worse holding small subject, writing, her sister also suffered similar bilateral hands shaking.She had long-standing history of diabetic peripheral neuropathy, complains of bilateral feet numbness tingling, taking gabapentin and the muscle relaxant.She had a few fall  accident, she also felt lightheaded dizzy when she first got up from overnight sleep, occasionally falling down when she get up quickly after prolonged sitting, she denies significant gait difficulty, no bowel and bladder incontinence, over the years, she had worsening progressing ascending paresthesia, now has fingertips numbness tingling, mild bilateral hands weakness, mild difficulty opening bottles.  Laboratory evaluation showed mild low TSH, she is going through treatment for hyperthyroidism, low normal B12 290,  vitamin D was mildly low 28, normal CMP, with exception of elevated glucose, 311, CBC, A1c was elevated 8.2I have personally reviewed CAT scan in January 2017, CAT scan of the brain, mild generalized atrophy, periventricular small vessel disease, calcification at left thalamus  CAT scan of cervical spine, multilevel mild degenerative changes.  UPDATE July 19th 2017: I also reviewed her most recent cardiology admission in July, she presented with paroxysmal atrial fibrillation with rapid ventricular rate, had cardial conversion, she is doing well now, x-ray of chest was normal, laboratory evaluation showed normal magnesium 1.7, mild elevated glucose, Today she also reported snoring, frequent wakening at nighttime, excessive daytime sleepiness and fatigue, ESS score is 12.  Review of Systems: Out of a complete 14 system review, the patient complains of only the following symptoms, and all other reviewed systems are negative. The patient endorsed the Epworth Sleepiness Scale at 14 points, the fatigue severity scale at 29 points  How likely are you to doze in the following situations: 0 = not likely, 1 = slight chance, 2 = moderate chance, 3 = high chance  Sitting and Reading? 3Watching Television?2Sitting inactive in a public place (theater or meeting)?2Lying down in the afternoon when circumstances permit?3Sitting and talking to someone?3 Sitting quietly after lunch without alcohol?0In a car, while stopped for a few minutes in traffic?2As a passenger in a car for an hour without a break?2   Social History   Social History  . Marital status: Divorced    Spouse name: N/A  . Number of children: 2  . Years of education: Masters   Occupational History  . Food Therapist, nutritional    Social History Main Topics  . Smoking status: Former Smoker    Packs/day: 0.00    Years: 41.00    Types: Cigarettes  . Smokeless tobacco: Never Used     Comment: 04/29/2015 "quit smoking cigarettes 02/27/2015"  .  Alcohol use No  . Drug use: No  . Sexual activity: Not Currently    Birth control/ protection: Post-menopausal, Surgical   Other Topics Concern  . 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      Lives with sister in [place].      Tobacco: 1 ppd x 41 years - quit 2016      Alcohol: none      Drugs: none      Exercise:  Walking in neighborhood; physical job.   Right-handed.   2 cups caffeine daily.       Family History  Problem Relation Age of Onset  . Cancer Mother 65    bronchial cancer  . Hypertension Father   . COPD Father   . Heart disease Father 60    CAD with cardiac stenting  . Heart attack Father   . Allergies Sister   .  Breast cancer Maternal Grandmother   . Emphysema Maternal Grandfather   . Leukemia Paternal Grandmother   . Emphysema Paternal Grandfather   . Thyroid disease Neg Hx   . Breast cancer Mother   . Lung cancer Mother   . Parkinson's disease Father     Past Medical History:  Diagnosis Date  . Abnormal EKG 07/31/2013  . Arthritis    "hands" (03/06/2015)  . Asthma   . Carpal tunnel syndrome, bilateral   . Carpal tunnel syndrome, bilateral   . Chronic kidney disease   . Coronary artery disease     2 v CAD with CTO of the RCA and high grade bifurcational LCx/OM stenosis. S/P PCI DES x 2 to the LCx/OM.  Marland Kitchen Diabetic peripheral neuropathy ([facility]) "since 1996"  . GERD (gastroesophageal reflux disease)   . Goiter   . History of shingles 06/01/2013  . Hyperlipidemia   . Hyperlipidemia LDL goal <70 10/13/2015  . Hypertension   . PAF (paroxysmal atrial fibrillation) ([facility]) 04/29/2015   CHADS2VASC score of 4 now on Apixaban  . Paroxysmal atrial fibrillation with rapid ventricular response ([facility]) 11/15/2015  . Pneumonia ~ 1976  .  Tremors of nervous system   . Type II diabetes mellitus ([facility])    insulin dependent    Past Surgical History:  Procedure Laterality Date  . ABDOMINAL HYSTERECTOMY  1988   age 31; CERVICAL DYSPLASIA; ovaries intact.   Marland Kitchen CARDIAC CATHETERIZATION N/A 02/27/2015   Procedure: Left Heart Cath and Coronary Angiography;  Surgeon: [name], MD; LAD 40%, mCFX 80%, OM 70%, RCA 100% calcified       . CARDIAC CATHETERIZATION N/A 03/06/2015   Procedure: Coronary Stent Intervention;  Surgeon: [name], MD;  Location: [facility];  Service: Cardiovascular;  Laterality: N/A;  Mid CX 3.50x12 promus DES w/ 0% resdual and Prox OM1 2.50x20 promus DES w/ 20% residual  . CARPAL TUNNEL RELEASE Right Nov 2015  . CARPAL TUNNEL RELEASE Right 1992; 05/2014   [name]; [place]  . CESAREAN SECTION  1982; 1984  . FOOT NEUROMA SURGERY Bilateral 2000  . KNEE ARTHROSCOPY Right ~ 2003   "meniscus repair"  . SHOULDER OPEN ROTATOR CUFF REPAIR Right 1996; 1998   "w/fracture repair"  . THYROID SURGERY  2000   "removed lots of nodules"  . TONSILLECTOMY  1976    Current Outpatient Prescriptions  Medication Sig Dispense Refill  . albuterol (PROVENTIL HFA) 108 (90 Base) MCG/ACT inhaler Inhale 1-2 puffs into the lungs every 6 (six) hours as needed for wheezing or shortness of breath. 1 Inhaler 0  . amLODipine (NORVASC) 5 MG tablet Take 1 tablet (5 mg total) by mouth daily. 30 tablet 11  . apixaban (ELIQUIS) 5 MG TABS tablet Take 1 tablet (5 mg total) by mouth 2 (two) times daily. 60 tablet 11  . atorvastatin (LIPITOR) 20 MG tablet Take 1 tablet (20 mg total) by mouth daily. 90 tablet 1  . buPROPion (WELLBUTRIN SR) 150 MG 12 hr tablet TAKE ONE TABLET BY MOUTH TWICE DAILY 60 tablet 0  . clindamycin (CLEOCIN) 300 MG capsule Take 300 mg by mouth as directed. Before dental appointments    . clopidogrel (PLAVIX) 75 MG tablet Take 1 tablet (75 mg total) by mouth daily. 30 tablet 11  . FLUoxetine (PROZAC) 20 MG  capsule Take 1 capsule (20 mg total) by mouth daily. (Patient taking differently: Take 20 mg by mouth at bedtime. ) 30 capsule 0  . fluticasone (FLOVENT HFA) 44 MCG/ACT  inhaler Inhale 2 puffs into the lungs 2 (two) times daily.    Marland Kitchen gabapentin (NEURONTIN) 300 MG capsule TAKE 3 CAPSULES BY MOUTH AT LUNCHTIME AND 3 CAPSULES AT BEDTIME (Patient taking differently: TAKE 3 CAPSULES BY MOUTH AT morning AND 3 CAPSULES AT BEDTIME) 180 capsule 1  . ibuprofen (ADVIL,MOTRIN) 200 MG tablet Take 200 mg by mouth every 6 (six) hours as needed.    . Insulin Glargine (BASAGLAR KWIKPEN) 100 UNIT/ML SOPN Inject 0.1 mLs (10 Units total) into the skin at bedtime. (Patient taking differently: Inject 12 Units into the skin daily at 10 pm. ) 15 mL 11  . insulin lispro (HUMALOG) 100 UNIT/ML injection Inject 0.2 mLs (20 Units total) into the skin 3 (three) times daily with meals. 30 mL 11  . Loperamide HCl (ANTI-DIARRHEAL PO) Take by mouth as directed.    Marland Kitchen losartan (COZAAR) 50 MG tablet Take 1 tablet (50 mg total) by mouth daily. 30 tablet 11  . metFORMIN (GLUCOPHAGE) 1000 MG tablet Take 1 tablet (1,000 mg total) by mouth 2 (two) times daily with a meal. 180 tablet 1  . methimazole (TAPAZOLE) 10 MG tablet Take 1 tablet (10 mg total) by mouth 2 (two) times daily. 60 tablet 11  . metoprolol succinate (TOPROL-XL) 25 MG 24 hr tablet Take 1 tablet (25 mg total) by mouth 2 (two) times daily. Take with or immediately following a meal. 60 tablet 11  . nitroGLYCERIN (NITROSTAT) 0.4 MG SL tablet Place 1 tablet (0.4 mg total) under the tongue every 5 (five) minutes as needed for chest pain. 25 tablet 3  . ondansetron (ZOFRAN) 4 MG tablet Take 1 tablet (4 mg total) by mouth every 8 (eight) hours as needed for nausea or vomiting. 20 tablet 0  . pantoprazole (PROTONIX) 40 MG tablet Take 1 tablet (40 mg total) by mouth daily. 30 tablet 11  . sotalol (BETAPACE) 80 MG tablet Take 1 tablet (80 mg total) by mouth every 12 (twelve) hours. 60  tablet 11  . traMADol (ULTRAM) 50 MG tablet Take 50-100 mg by mouth 2 (two) times daily. Usually takes 1 tab in am as needed, and 2 tablets at bedtime     No current facility-administered medications for this visit.     Allergies as of 12/09/2015 - Review Complete 12/09/2015  Allergen Reaction Noted  . Novolog [insulin aspart] Shortness Of Breath and Other (See Comments) 02/27/2015  . Codeine Nausea And Vomiting and Other (See Comments) 09/28/2013  . Iodine Other (See Comments) 01/30/2013  . Demerol [meperidine] Nausea And Vomiting 08/15/2015  . Propofol Other (See Comments) 11/26/2015  . Ace inhibitors Cough 03/13/2015  . Neosporin [neomycin-bacitracin zn-polymyx] Itching and Rash 01/30/2013  . Penicillins Itching, Rash, and Other (See Comments) 01/30/2013  . Percocet [oxycodone-acetaminophen] Rash 08/15/2015  . Tape Itching and Rash 01/30/2013    Vitals: BP 110/70   Pulse 80   Resp 20   Ht 5\' 4"  (1.626 m)   Wt 181 lb (82.1 kg)   BMI 31.07 kg/m  Last Weight:  Wt Readings from Last 1 Encounters:  12/09/15 181 lb (82.1 kg)   [id number] mass index is 31.07 kg/m.     Last Height:   Ht Readings from Last 1 Encounters:  12/09/15 5\' 4"  (1.626 m)    Physical exam:  General: The patient is awake, alert and appears not in acute distress. The patient is well groomed. Head: Normocephalic, atraumatic. Neck is supple. Mallampati 4 , goiter is noted.  neck circumference:`17. Nasal airflow  restricted , Retrognathia is seen.  Cardiovascular:  Regular rate and rhythm , without  murmurs or carotid bruit, and without distended neck veins. Respiratory: Lungs are clear to auscultation. Skin:  Without evidence of edema, or rash Trunk: BMI is elevated . The patient's posture is erect  Neurologic exam : The patient is awake and alert, oriented to place and time.   Memory subjective described as intact.  Memory testing revealed :  MOCA:No flowsheet data found. MMSE: MMSE - Mini Mental  State Exam 11/26/2015  Orientation to time 5  Orientation to Place 5  Registration 3  Attention/ Calculation 5  Recall 2  Language- name 2 objects 2  Language- repeat 1  Language- follow 3 step command 3  Language- read & follow direction 1  Write a sentence 1  Copy design 1  Total score 29       Attention span & concentration ability appears normal.  Speech is fluent,  without dysarthria, but with  dysphonia or aphasia.  Mood and affect are appropriate.  Cranial nerves: Pupils are equal and briskly reactive to light. Funduscopic exam without  evidence of pallor or edema. Extraocular movements  in vertical and horizontal planes intact and without nystagmus. Visual fields by finger perimetry are intact. Hearing to finger rub intact.   Facial sensation intact to fine touch.  Facial motor strength is symmetric and tongue and uvula move midline. Shoulder shrug was symmetrical.   Motor exam:   Normal tone, muscle bulk and symmetric strength in all extremities.   The patient was advised of the nature of the diagnosed sleep disorder , the treatment options and risks for general a health and wellness arising from not treating the condition.  I spent more than 40 minutes of face to face time with the patient. Greater than 50% of time was spent in counseling and coordination of care. We have discussed the diagnosis and differential and I answered the patient's questions.     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 She is known to have apnea this was witnessed during hospitalization as well as snoring. She has a goiter large than usual neck  circumference, high-grade Mallampati. I will order an attended sleep study for her to be performed as a split night polysomnography. She does not complain of headaches in the morning a very dry mouth or any discomfort but she does have nocturia. I will not need hypercapnia to be evaluated and capnography will not be needed.   Plan:  Treatment plan and additional workup :  SPLIT, 15 AHI , RV after sleep study    Larey Seat MD  12/09/2015   CC: [name], [place] [address] [place], [place] [id number]

## 2015-12-09 NOTE — Patient Instructions (Signed)

## 2015-12-13 ENCOUNTER — Other Ambulatory Visit: Payer: Self-pay | Admitting: Family Medicine

## 2015-12-15 ENCOUNTER — Ambulatory Visit: Payer: Managed Care, Other (non HMO) | Admitting: Podiatry

## 2015-12-15 NOTE — Telephone Encounter (Signed)
Faxed and LMOM advising pt of RF and need for f/up.

## 2015-12-15 NOTE — Telephone Encounter (Signed)
Please call patient ---- she is due for six month follow-up. I cannot refill Tramadol more without an appointment. I have approved a one month supply to allow her time to schedule an appointment or call for same day appointment.

## 2015-12-17 ENCOUNTER — Other Ambulatory Visit: Payer: Self-pay | Admitting: Family Medicine

## 2015-12-19 ENCOUNTER — Other Ambulatory Visit: Payer: Self-pay

## 2015-12-19 MED ORDER — INSULIN LISPRO 100 UNIT/ML ~~LOC~~ SOLN: 20.0000 [IU] | Freq: Three times a day (TID) | SUBCUTANEOUS | 11 refills | Status: DC

## 2016-01-04 ENCOUNTER — Emergency Department (HOSPITAL_COMMUNITY): Payer: Managed Care, Other (non HMO)

## 2016-01-04 ENCOUNTER — Emergency Department (HOSPITAL_COMMUNITY)
Admission: EM | Admit: 2016-01-04 | Discharge: 2016-01-04 | Disposition: A | Discharge status: Home / Self Care | Payer: Managed Care, Other (non HMO) | Attending: Emergency Medicine | Admitting: Emergency Medicine

## 2016-01-04 ENCOUNTER — Encounter (HOSPITAL_COMMUNITY): Payer: Self-pay | Admitting: Emergency Medicine

## 2016-01-04 DIAGNOSIS — Z7901 Long term (current) use of anticoagulants: Secondary | ICD-10-CM | POA: Diagnosis not present

## 2016-01-04 DIAGNOSIS — J45909 Unspecified asthma, uncomplicated: Secondary | ICD-10-CM | POA: Diagnosis not present

## 2016-01-04 DIAGNOSIS — N189 Chronic kidney disease, unspecified: Secondary | ICD-10-CM | POA: Diagnosis not present

## 2016-01-04 DIAGNOSIS — L97509 Non-pressure chronic ulcer of other part of unspecified foot with unspecified severity: Secondary | ICD-10-CM

## 2016-01-04 DIAGNOSIS — Z794 Long term (current) use of insulin: Secondary | ICD-10-CM | POA: Diagnosis not present

## 2016-01-04 DIAGNOSIS — E1122 Type 2 diabetes mellitus with diabetic chronic kidney disease: Secondary | ICD-10-CM | POA: Insufficient documentation

## 2016-01-04 DIAGNOSIS — Z7984 Long term (current) use of oral hypoglycemic drugs: Secondary | ICD-10-CM | POA: Insufficient documentation

## 2016-01-04 DIAGNOSIS — I2510 Atherosclerotic heart disease of native coronary artery without angina pectoris: Secondary | ICD-10-CM | POA: Diagnosis not present

## 2016-01-04 DIAGNOSIS — L97519 Non-pressure chronic ulcer of other part of right foot with unspecified severity: Secondary | ICD-10-CM | POA: Diagnosis not present

## 2016-01-04 DIAGNOSIS — L03031 Cellulitis of right toe: Secondary | ICD-10-CM

## 2016-01-04 DIAGNOSIS — E1140 Type 2 diabetes mellitus with diabetic neuropathy, unspecified: Secondary | ICD-10-CM | POA: Diagnosis not present

## 2016-01-04 DIAGNOSIS — L03115 Cellulitis of right lower limb: Secondary | ICD-10-CM | POA: Diagnosis not present

## 2016-01-04 DIAGNOSIS — I120 Hypertensive chronic kidney disease with stage 5 chronic kidney disease or end stage renal disease: Secondary | ICD-10-CM | POA: Diagnosis not present

## 2016-01-04 DIAGNOSIS — Z87891 Personal history of nicotine dependence: Secondary | ICD-10-CM | POA: Diagnosis not present

## 2016-01-04 DIAGNOSIS — E11621 Type 2 diabetes mellitus with foot ulcer: Secondary | ICD-10-CM | POA: Diagnosis not present

## 2016-01-04 DIAGNOSIS — N185 Chronic kidney disease, stage 5: Secondary | ICD-10-CM | POA: Diagnosis not present

## 2016-01-04 LAB — CBC WITH DIFFERENTIAL/PLATELET
Basophils Absolute: 0 10*3/uL (ref 0.0–0.1)
Basophils Relative: 0 %
EOS ABS: 0.2 10*3/uL (ref 0.0–0.7)
EOS PCT: 2 %
HCT: 35.2 % — ABNORMAL LOW (ref 36.0–46.0)
Hemoglobin: 11.1 g/dL — ABNORMAL LOW (ref 12.0–15.0)
LYMPHS ABS: 1.7 10*3/uL (ref 0.7–4.0)
LYMPHS PCT: 13 %
MCH: 27.8 pg (ref 26.0–34.0)
MCHC: 31.5 g/dL (ref 30.0–36.0)
MCV: 88.2 fL (ref 78.0–100.0)
Monocytes Absolute: 0.8 10*3/uL (ref 0.1–1.0)
Monocytes Relative: 6 %
NEUTROS ABS: 10.6 10*3/uL — AB (ref 1.7–7.7)
NEUTROS PCT: 80 %
PLATELETS: 315 10*3/uL (ref 150–400)
RBC: 3.99 MIL/uL (ref 3.87–5.11)
RDW: 13.7 % (ref 11.5–15.5)
WBC: 13.3 10*3/uL — AB (ref 4.0–10.5)

## 2016-01-04 LAB — BASIC METABOLIC PANEL
Anion gap: 9 (ref 5–15)
BUN: 18 mg/dL (ref 6–20)
CALCIUM: 9.4 mg/dL (ref 8.9–10.3)
CO2: 27 mmol/L (ref 22–32)
CREATININE: 0.69 mg/dL (ref 0.44–1.00)
Chloride: 101 mmol/L (ref 101–111)
GFR calc Af Amer: >60 mL/min (ref >60)
Glucose, Bld: 216 mg/dL — ABNORMAL HIGH (ref 65–99)
POTASSIUM: 4.2 mmol/L (ref 3.5–5.1)
SODIUM: 137 mmol/L (ref 135–145)

## 2016-01-04 MED ORDER — DOXYCYCLINE HYCLATE 100 MG PO CAPS: 100.0000 mg | ORAL_CAPSULE | Freq: Two times a day (BID) | ORAL | 0 refills | Status: DC

## 2016-01-04 NOTE — ED Triage Notes (Signed)
Pt has a ulcer on the bottom of her third toe on the right foot. Pt just noticed it 2 days ago. Pt states she does have Diabetes and decrease sensation in both feet. Toe is red and swollen.

## 2016-01-04 NOTE — Discharge Instructions (Signed)
You were seen in the emergency room today for evaluation of your right third toe. You have an ulcerative wound and surrounding infection of the skin. The x-ray today did not show evidence of bony involvement. I will start you on a course of antibiotics (doxycycline). Please schedule follow up appointment as soon as possible with your primary care provider and/or Dr. Sharol Given (orthopedic specialist) to monitor your toe. Return to the ER for new or worsening symptoms.

## 2016-01-04 NOTE — ED Provider Notes (Signed)
[facility] DEPT Provider Note   CSN: [id number] Arrival date & time: 01/04/16  1037     History   Chief Complaint Chief Complaint  Patient presents with  . Toe Pain    HPI   Judith [name] is an 65 y.o. female with history of diabetes and bilateral diabetic neuropathy who presents to the ED for evaluation of right third toe redness and swelling. She states two days ago she happened to glance down and notice an ulcerative wound on the distal plantar tip of her toe. She states it is not painful but she also admits she does not have much sensation in either of her feet. She states she also noticed over the past couple of days her right third toe has become red and swollen. She denies drainage. She does not remember injuring her toe. She has not tried anything to alleviate her symptoms.  Past Medical History:  Diagnosis Date  . Abnormal EKG 07/31/2013  . Arthritis    "hands" (03/06/2015)  . Asthma   . Carpal tunnel syndrome, bilateral   . Carpal tunnel syndrome, bilateral   . Chronic kidney disease   . Coronary artery disease     2 v CAD with CTO of the RCA and high grade bifurcational LCx/OM stenosis. S/P PCI DES x 2 to the LCx/OM.  Marland Kitchen Diabetic peripheral neuropathy ([place]) "since 1996"  . GERD (gastroesophageal reflux disease)   . Goiter   . History of shingles 06/01/2013  . Hyperlipidemia   . Hyperlipidemia LDL goal <70 10/13/2015  . Hypertension   . PAF (paroxysmal atrial fibrillation) ([place]) 04/29/2015   CHADS2VASC score of 4 now on Apixaban  . Paroxysmal atrial fibrillation with rapid ventricular response ([place]) 11/15/2015  . Pneumonia ~ 1976  . Tremors of nervous system   . Type II diabetes mellitus (HCC)    insulin dependent    Patient Active Problem List   Diagnosis Date Noted  . Obstructive sleep apnea 11/26/2015  . Bilateral carpal tunnel syndrome 11/26/2015  . Hypomagnesemia 11/16/2015  . Hyperlipidemia LDL goal <70 10/13/2015  . Abnormality of gait 09/02/2015  .  Memory loss 08/12/2015  . Diabetic peripheral neuropathy ([place]) 08/12/2015  . Vitamin D deficiency 08/12/2015  . Hyperthyroidism 04/30/2015  . Heme positive stool   . PAF (paroxysmal atrial fibrillation) ([place]) 04/29/2015  . Coronary artery disease involving native coronary artery of native heart without angina pectoris 03/29/2015  . Groin hematoma 2020/10/1514  . Exertional angina ([place]) 03/06/2015  . Abnormal nuclear stress test   . Acute kidney injury ([place]) 02/12/2015  . Acute hyponatremia 02/12/2015  . History of goiter 09/28/2014  . GERD (gastroesophageal reflux disease) 07/31/2013  . Depression with anxiety 06/01/2013  . Diabetes ([place]) 01/30/2013  . HTN (hypertension) 01/30/2013  . Diabetic neuropathy, painful ([place]) 01/30/2013  . Tobacco user 01/30/2013    Past Surgical History:  Procedure Laterality Date  . ABDOMINAL HYSTERECTOMY  1988   age 74; CERVICAL DYSPLASIA; ovaries intact.   Marland Kitchen CARDIAC CATHETERIZATION N/A 02/27/2015   Procedure: Left Heart Cath and Coronary Angiography;  Surgeon: [name], MD; LAD 40%, mCFX 80%, OM 70%, RCA 100% calcified       . CARDIAC CATHETERIZATION N/A 03/06/2015   Procedure: Coronary Stent Intervention;  Surgeon: [name], MD;  Location: [facility];  Service: Cardiovascular;  Laterality: N/A;  Mid CX 3.50x12 promus DES w/ 0% resdual and Prox OM1 2.50x20 promus DES w/ 20% residual  . CARPAL TUNNEL RELEASE Right Nov 2015  .  CARPAL TUNNEL RELEASE Right 1992; 05/2014   [name]; [place]  . CESAREAN SECTION  1982; 1984  . FOOT NEUROMA SURGERY Bilateral 2000  . KNEE ARTHROSCOPY Right ~ 2003   "meniscus repair"  . SHOULDER OPEN ROTATOR CUFF REPAIR Right 1996; 1998   "w/fracture repair"  . THYROID SURGERY  2000   "removed lots of nodules"  . TONSILLECTOMY  1976    OB History    No data available       Home Medications    Prior to Admission medications   Medication Sig Start Date End Date Taking? Authorizing Provider    albuterol (PROVENTIL HFA) 108 (90 Base) MCG/ACT inhaler Inhale 1-2 puffs into the lungs every 6 (six) hours as needed for wheezing or shortness of breath. 11/07/15   [name], MD  amLODipine (NORVASC) 5 MG tablet Take 1 tablet (5 mg total) by mouth daily. 11/18/15   Rhonda G Barrett, PA-C  apixaban (ELIQUIS) 5 MG TABS tablet Take 1 tablet (5 mg total) by mouth 2 (two) times daily. 05/01/15   Bhavinkumar Bhagat, PA  atorvastatin (LIPITOR) 20 MG tablet Take 1 tablet (20 mg total) by mouth daily. 04/22/15   Liliane Shi, PA-C  buPROPion (WELLBUTRIN SR) 150 MG 12 hr tablet TAKE ONE TABLET BY MOUTH TWICE DAILY 11/08/15   [name], MD  clindamycin (CLEOCIN) 300 MG capsule Take 300 mg by mouth as directed. Before dental appointments    Historical Provider, MD  clopidogrel (PLAVIX) 75 MG tablet Take 1 tablet (75 mg total) by mouth daily. 02/27/15   [name], MD  FLUoxetine (PROZAC) 20 MG capsule Take 1 capsule (20 mg total) by mouth daily. Patient taking differently: Take 20 mg by mouth at bedtime.  11/08/15   [name], MD  fluticasone (FLOVENT HFA) 44 MCG/ACT inhaler Inhale 2 puffs into the lungs 2 (two) times daily.    Historical Provider, MD  gabapentin (NEURONTIN) 300 MG capsule TAKE 3 CAPSULES BY MOUTH AT LUNCHTIME AND 3 CAPSULES AT BEDTIME Patient taking differently: TAKE 3 CAPSULES BY MOUTH AT morning AND 3 CAPSULES AT BEDTIME 10/13/15   [name], MD  ibuprofen (ADVIL,MOTRIN) 200 MG tablet Take 200 mg by mouth every 6 (six) hours as needed.    Historical Provider, MD  Insulin Glargine (BASAGLAR KWIKPEN) 100 UNIT/ML SOPN Inject 0.1 mLs (10 Units total) into the skin at bedtime. Patient taking differently: Inject 12 Units into the skin daily at 10 pm.  10/15/15   Renato Shin, MD  insulin lispro (HUMALOG) 100 UNIT/ML injection Inject 0.2 mLs (20 Units total) into the skin 3 (three) times daily with meals. 12/19/15   Renato Shin, MD  Loperamide HCl (ANTI-DIARRHEAL PO) Take by  mouth as directed.    Historical Provider, MD  losartan (COZAAR) 50 MG tablet Take 1 tablet (50 mg total) by mouth daily. 03/13/15   Liliane Shi, PA-C  metFORMIN (GLUCOPHAGE) 1000 MG tablet Take 1 tablet (1,000 mg total) by mouth 2 (two) times daily with a meal. 10/23/15   Renato Shin, MD  methimazole (TAPAZOLE) 10 MG tablet Take 1 tablet (10 mg total) by mouth 2 (two) times daily. 10/15/15   Renato Shin, MD  metoprolol succinate (TOPROL-XL) 25 MG 24 hr tablet Take 1 tablet (25 mg total) by mouth 2 (two) times daily. Take with or immediately following a meal. 11/18/15   Rhonda G Barrett, PA-C  nitroGLYCERIN (NITROSTAT) 0.4 MG SL tablet Place 1 tablet (0.4 mg total) under  the tongue every 5 (five) minutes as needed for chest pain. 02/25/15   Liliane Shi, PA-C  ondansetron (ZOFRAN) 4 MG tablet Take 1 tablet (4 mg total) by mouth every 8 (eight) hours as needed for nausea or vomiting. 04/13/15   [name], PA-C  pantoprazole (PROTONIX) 40 MG tablet Take 1 tablet (40 mg total) by mouth daily. 03/07/15   Rhonda G Barrett, PA-C  sotalol (BETAPACE) 80 MG tablet Take 1 tablet (80 mg total) by mouth every 12 (twelve) hours. 11/18/15   Evelene Croon Barrett, PA-C  traMADol (ULTRAM) 50 MG tablet TAKE ONE TO TWO TABLETS BY MOUTH AT BEDTIME AS NEEDED NEUROPATHY 12/15/15   [name], MD    Family History Family History  Problem Relation Age of Onset  . Cancer Mother 37    bronchial cancer  . Breast cancer Mother   . Lung cancer Mother   . Hypertension Father   . COPD Father   . Heart disease Father 41    CAD with cardiac stenting  . Heart attack Father   . Parkinson's disease Father   . Allergies Sister   . Breast cancer Maternal Grandmother   . Emphysema Maternal Grandfather   . Leukemia Paternal Grandmother   . Emphysema Paternal Grandfather   . Thyroid disease Neg Hx     Social History Social History  Substance Use Topics  . Smoking status: Former Smoker    Packs/day: 0.00    Years:  41.00    Types: Cigarettes  . Smokeless tobacco: Never Used     Comment: 04/29/2015 "quit smoking cigarettes 02/27/2015"  . Alcohol use No     Allergies   Novolog [insulin aspart]; Codeine; Iodine; Demerol [meperidine]; Propofol; Ace inhibitors; Neosporin [neomycin-bacitracin zn-polymyx]; Penicillins; Percocet [oxycodone-acetaminophen]; and Tape   Review of Systems Review of Systems 10 Systems reviewed and are negative for acute change except as noted in the HPI.   Physical Exam Updated Vital Signs BP 171/82 (BP Location: Right Arm)   Pulse 75   Temp 98.4 F (36.9 C) (Oral)   Resp 16   Ht 5\' 5"  (1.651 m)   Wt 82.1 kg   SpO2 100%   BMI 30.12 kg/m   Physical Exam  Cardiovascular:  Pulses:      Dorsalis pedis pulses are 2+ on the right side.       Posterior tibial pulses are 2+ on the right side.  Feet:  Right Foot:  Skin Integrity: Positive for ulcer (plantar aspect distal third toe), erythema (third toe) and warmth (third toe).     ED Treatments / Results  Labs (all labs ordered are listed, but only abnormal results are displayed) Labs Reviewed  CBC WITH DIFFERENTIAL/PLATELET - Abnormal; Notable for the following:       Result Value   WBC 13.3 (*)    Hemoglobin 11.1 (*)    HCT 35.2 (*)    Neutro Abs 10.6 (*)    All other components within normal limits  BASIC METABOLIC PANEL - Abnormal; Notable for the following:    Glucose, Bld 216 (*)    All other components within normal limits    EKG  EKG Interpretation None       Radiology Dg Toe 3rd Right  Result Date: 01/04/2016 CLINICAL DATA:  Redness and swelling.  Diabetes EXAM: RIGHT THIRD TOE COMPARISON:  None. FINDINGS: Diffuse soft tissue swelling right third toe. Negative for fracture or osteomyelitis. No arthropathy. IMPRESSION: No acute bony abnormality.  Soft  tissue swelling third toe Electronically Signed   By: Franchot Gallo M.D.   On: 01/04/2016 12:57    Procedures Procedures (including  critical care time)  Medications Ordered in ED Medications - No data to display   Initial Impression / Assessment and Plan / ED Course  I have reviewed the triage vital signs and the nursing notes.  Pertinent labs & imaging results that were available during my care of the patient were reviewed by me and considered in my medical decision making (see chart for details).  Clinical Course   Pt is a diabetic presenting with right third toe ulcer with developing cellulitis. No e/o osteo on x-ray. She is otherwise hemodynamically stable, no sepsis. Will treat with course of doxy. Instructed pt that she needs close f/u with PCP and/or ortho to monitor the wound. ER return precautions given.  Final Clinical Impressions(s) / ED Diagnoses   Final diagnoses:  Cellulitis of toe of right foot  Diabetic ulcer of toe ([place])    New Prescriptions Discharge Medication List as of 01/04/2016  1:15 PM    START taking these medications   Details  doxycycline (VIBRAMYCIN) 100 MG capsule Take 1 capsule (100 mg total) by mouth 2 (two) times daily., Starting Sun 01/04/2016, Print         Anne Ng, PA-C 01/05/16 1540    Leonard Schwartz, MD 01/21/16 [phone]

## 2016-01-15 ENCOUNTER — Ambulatory Visit (INDEPENDENT_AMBULATORY_CARE_PROVIDER_SITE_OTHER): Payer: Managed Care, Other (non HMO) | Admitting: Endocrinology

## 2016-01-15 ENCOUNTER — Encounter: Payer: Self-pay | Admitting: Endocrinology

## 2016-01-15 VITALS — BP 126/70 | HR 79 | Ht 65.0 in | Wt 184.0 lb

## 2016-01-15 DIAGNOSIS — E0590 Thyrotoxicosis, unspecified without thyrotoxic crisis or storm: Secondary | ICD-10-CM | POA: Diagnosis not present

## 2016-01-15 DIAGNOSIS — Z794 Long term (current) use of insulin: Secondary | ICD-10-CM

## 2016-01-15 DIAGNOSIS — E0801 Diabetes mellitus due to underlying condition with hyperosmolarity with coma: Secondary | ICD-10-CM

## 2016-01-15 LAB — POCT GLYCOSYLATED HEMOGLOBIN (HGB A1C): Hemoglobin A1C: 8.8

## 2016-01-15 LAB — T4, FREE: Free T4: 0.55 ng/dL — ABNORMAL LOW (ref 0.60–1.60)

## 2016-01-15 LAB — TSH: TSH: 5.81 u[IU]/mL — ABNORMAL HIGH (ref 0.35–4.50)

## 2016-01-15 MED ORDER — METHIMAZOLE 10 MG PO TABS: 10.0000 mg | ORAL_TABLET | Freq: Every day | ORAL | 11 refills | Status: DC

## 2016-01-15 MED ORDER — BASAGLAR KWIKPEN 100 UNIT/ML ~~LOC~~ SOPN: 20.0000 [IU] | PEN_INJECTOR | Freq: Every day | SUBCUTANEOUS | 11 refills | Status: DC

## 2016-01-15 MED ORDER — INSULIN LISPRO 100 UNIT/ML ~~LOC~~ SOLN: SUBCUTANEOUS | 11 refills | Status: DC

## 2016-01-15 NOTE — Patient Instructions (Addendum)
blood tests are requested for you today.  We'll let you know about the results. if ever you have fever while taking methimazole, stop it and call us, even if the reason is obvious, because of the risk of a rare side-effect. check your blood sugar twice a day.  vary the time of day when you check, between before the 3 meals, and at bedtime.  also check if you have symptoms of your blood sugar being too high or too low.  please keep a record of the readings and bring it to your next appointment here (or you can bring the meter itself).  You can write it on any piece of paper.  please call us sooner if your blood sugar goes below 70, or if you have a lot of readings over 200.  change the levemir to "basaglar," 20 units at bedtime, and:  Increase the humalog to 3 times a day (just before each meal), 20-20-15 units.  Please come back for a follow-up appointment in 2 months.

## 2016-01-15 NOTE — Progress Notes (Signed)
Subjective:    Patient ID: Judith [name], female    DOB: 1951/03/24, 65 y.o.   MRN: [id number]  HPI Pt returns for f/u of hyperthyroidism (pt says she took synthroid for a brief time in the 1990's, in an attempt to shrink a goiter; slightly suppressed TSH was first noted in late 2016, when she was in the hospital for new-onset AF; she converted back to SR; nuc med scan showed heterogeneous uptake; neck scar is from C-spine procedure). she takes tapazole as rx'ed.  Pt also requests rx for DM: DM type: Insulin-requiring type 2. Dx'ed: 0000000 Complications: polyneuropathy, nephropathy, and CAD.  Therapy: insulin since 1997 GDM: never DKA: never Severe hypoglycemia: never Pancreatitis: never Other: she takes multiple daily injections.  Interval history: no cbg record, but states cbg's vary from 87-200's.  It is lowest at hs, and highest fasting.   Past Medical History:  Diagnosis Date  . Abnormal EKG 07/31/2013  . Arthritis    "hands" (03/06/2015)  . Asthma   . Carpal tunnel syndrome, bilateral   . Carpal tunnel syndrome, bilateral   . Chronic kidney disease   . Coronary artery disease     2 v CAD with CTO of the RCA and high grade bifurcational LCx/OM stenosis. S/P PCI DES x 2 to the LCx/OM.  Marland Kitchen Diabetic peripheral neuropathy ([place]) "since 1996"  . GERD (gastroesophageal reflux disease)   . Goiter   . History of shingles 06/01/2013  . Hyperlipidemia   . Hyperlipidemia LDL goal <70 10/13/2015  . Hypertension   . PAF (paroxysmal atrial fibrillation) ([place]) 04/29/2015   CHADS2VASC score of 4 now on Apixaban  . Paroxysmal atrial fibrillation with rapid ventricular response ([employer]) 11/15/2015  . Pneumonia ~ 1976  . Tremors of nervous system   . Type II diabetes mellitus (HCC)    insulin dependent    Past Surgical History:  Procedure Laterality Date  . ABDOMINAL HYSTERECTOMY  1988   age 77; CERVICAL DYSPLASIA; ovaries intact.   Marland Kitchen CARDIAC CATHETERIZATION N/A 02/27/2015   Procedure: Left  Heart Cath and Coronary Angiography;  Surgeon: [name], MD; LAD 40%, mCFX 80%, OM 70%, RCA 100% calcified       . CARDIAC CATHETERIZATION N/A 03/06/2015   Procedure: Coronary Stent Intervention;  Surgeon: [name], MD;  Location: [facility];  Service: Cardiovascular;  Laterality: N/A;  Mid CX 3.50x12 promus DES w/ 0% resdual and Prox OM1 2.50x20 promus DES w/ 20% residual  . CARPAL TUNNEL RELEASE Right Nov 2015  . CARPAL TUNNEL RELEASE Right 1992; 05/2014   [name]; [place]  . CESAREAN SECTION  1982; 1984  . FOOT NEUROMA SURGERY Bilateral 2000  . KNEE ARTHROSCOPY Right ~ 2003   "meniscus repair"  . SHOULDER OPEN ROTATOR CUFF REPAIR Right 1996; 1998   "w/fracture repair"  . THYROID SURGERY  2000   "removed lots of nodules"  . TONSILLECTOMY  1976    Social History   Social History  . Marital status: Divorced    Spouse name: N/A  . Number of children: 2  . Years of education: Masters   Occupational History  . Food Therapist, nutritional    Social History Main Topics  . Smoking status: Former Smoker    Packs/day: 0.00    Years: 41.00    Types: Cigarettes  . Smokeless tobacco: Never Used     Comment: 04/29/2015 "quit smoking cigarettes 02/27/2015"  . Alcohol use No  . Drug use: No  . Sexual activity: Not  Currently    Birth control/ protection: Post-menopausal, Surgical   Other Topics Concern  . 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      Lives with sister in [place].      Tobacco: 1 ppd x 41 years - quit 2016      Alcohol: none      Drugs: none      Exercise:  Walking in neighborhood; physical job.   Right-handed.   2 cups caffeine daily.       Current Outpatient Prescriptions on File Prior to Visit  Medication Sig  Dispense Refill  . albuterol (PROVENTIL HFA) 108 (90 Base) MCG/ACT inhaler Inhale 1-2 puffs into the lungs every 6 (six) hours as needed for wheezing or shortness of breath. 1 Inhaler 0  . amLODipine (NORVASC) 5 MG tablet Take 1 tablet (5 mg total) by mouth daily. 30 tablet 11  . apixaban (ELIQUIS) 5 MG TABS tablet Take 1 tablet (5 mg total) by mouth 2 (two) times daily. 60 tablet 11  . atorvastatin (LIPITOR) 20 MG tablet Take 1 tablet (20 mg total) by mouth daily. 90 tablet 1  . buPROPion (WELLBUTRIN SR) 150 MG 12 hr tablet TAKE ONE TABLET BY MOUTH TWICE DAILY 60 tablet 0  . clindamycin (CLEOCIN) 300 MG capsule Take 300 mg by mouth as directed. Before dental appointments    . clopidogrel (PLAVIX) 75 MG tablet Take 1 tablet (75 mg total) by mouth daily. 30 tablet 11  . doxycycline (VIBRAMYCIN) 100 MG capsule Take 1 capsule (100 mg total) by mouth 2 (two) times daily. 20 capsule 0  . FLUoxetine (PROZAC) 20 MG capsule Take 1 capsule (20 mg total) by mouth daily. (Patient taking differently: Take 20 mg by mouth at bedtime. ) 30 capsule 0  . fluticasone (FLOVENT HFA) 44 MCG/ACT inhaler Inhale 2 puffs into the lungs 2 (two) times daily.    Marland Kitchen gabapentin (NEURONTIN) 300 MG capsule TAKE 3 CAPSULES BY MOUTH AT LUNCHTIME AND 3 CAPSULES AT BEDTIME (Patient taking differently: TAKE 3 CAPSULES BY MOUTH AT morning AND 3 CAPSULES AT BEDTIME) 180 capsule 1  . ibuprofen (ADVIL,MOTRIN) 200 MG tablet Take 200 mg by mouth every 6 (six) hours as needed.    . Loperamide HCl (ANTI-DIARRHEAL PO) Take by mouth as directed.    Marland Kitchen losartan (COZAAR) 50 MG tablet Take 1 tablet (50 mg total) by mouth daily. 30 tablet 11  . metFORMIN (GLUCOPHAGE) 1000 MG tablet Take 1 tablet (1,000 mg total) by mouth 2 (two) times daily with a meal. 180 tablet 1  . metoprolol succinate (TOPROL-XL) 25 MG 24 hr tablet Take 1 tablet (25 mg total) by mouth 2 (two) times daily. Take with or immediately following a meal. 60 tablet 11  . nitroGLYCERIN  (NITROSTAT) 0.4 MG SL tablet Place 1 tablet (0.4 mg total) under the tongue every 5 (five) minutes as needed for chest pain. 25 tablet 3  . ondansetron (ZOFRAN) 4 MG tablet Take 1 tablet (4 mg total) by mouth every 8 (eight) hours as needed for nausea or vomiting. 20 tablet 0  . pantoprazole (PROTONIX) 40 MG tablet Take 1 tablet (40 mg total) by mouth daily. 30 tablet 11  . sotalol (BETAPACE) 80 MG tablet Take 1  tablet (80 mg total) by mouth every 12 (twelve) hours. 60 tablet 11  . traMADol (ULTRAM) 50 MG tablet TAKE ONE TO TWO TABLETS BY MOUTH AT BEDTIME AS NEEDED NEUROPATHY 60 tablet 0   No current facility-administered medications on file prior to visit.     Allergies  Allergen Reactions  . Novolog [Insulin Aspart] Shortness Of Breath and Other (See Comments)    "breathing problems"  . Codeine Nausea And Vomiting and Other (See Comments)    HIGH DOSES-SEVERE VOMITING  . Iodine Other (See Comments)    MUST HAVE BENADRYL PRIOR TO PROCEDURE AND RIGHT BEFORE TREATMENT TO COUNTERACT REACTION-BLISTERING REACTION DERMATOLOGICAL  . Demerol [Meperidine] Nausea And Vomiting  . Propofol Other (See Comments)    "Breathing problems - asthma attack"  . Ace Inhibitors Cough  . Neosporin [Neomycin-Bacitracin Zn-Polymyx] Itching and Rash    MAKES REACTIONS WORSE WHEN USING AS PROPHYLACTIC  . Penicillins Itching, Rash and Other (See Comments)    Has patient had a PCN reaction causing immediate rash, facial/tongue/throat swelling, SOB or lightheadedness with hypotension: Yes Has patient had a PCN reaction causing severe rash involving mucus membranes or skin necrosis: No Has patient had a PCN reaction that required hospitalization No Has patient had a PCN reaction occurring within the last 10 years: No If all of the above answers are "NO", then may proceed with Cephalosporin use.  CHEST SIZED RASH AND ITCHING   . Percocet [Oxycodone-Acetaminophen] Rash  . Tape Itching and Rash    Family History    Problem Relation Age of Onset  . Cancer Mother 10    bronchial cancer  . Breast cancer Mother   . Lung cancer Mother   . Hypertension Father   . COPD Father   . Heart disease Father 45    CAD with cardiac stenting  . Heart attack Father   . Parkinson's disease Father   . Allergies Sister   . Breast cancer Maternal Grandmother   . Emphysema Maternal Grandfather   . Leukemia Paternal Grandmother   . Emphysema Paternal Grandfather   . Thyroid disease Neg Hx     BP 126/70   Pulse 79   Ht 5\' 5"  (1.651 m)   Wt 184 lb (83.5 kg)   SpO2 95%   BMI 30.62 kg/m   Review of Systems She has gained weight.  She denies hypoglycemia and fever.     Objective:   Physical Exam VITAL SIGNS:  See vs page GENERAL: no distress Pulses: dorsalis pedis intact bilat.   MSK: no deformity of the feet CV: trace bilat leg edema Skin:  healing ulcer at the tip of the left 3rd toe.  normal color and temp on the feet.  Neuro: sensation is intact to touch on the feet, but severely decreased from normal Ext: There is bilateral onychomycosis of the toenails.    Lab Results  Component Value Date   HGBA1C 8.8 01/15/2016   Lab Results  Component Value Date   TSH 5.81 (H) 01/15/2016   T4TOTAL 7.8 04/30/2015      Assessment & Plan:  Hyperthyroidism: we discussed rx options: she declines RAI.  Hypothyroidism, due to tapazole: reduce to 10 mg/d Insulin-requiring type 2 DM: she needs increased rx.

## 2016-01-16 ENCOUNTER — Ambulatory Visit: Payer: Self-pay | Admitting: Endocrinology

## 2016-01-17 ENCOUNTER — Other Ambulatory Visit: Payer: Self-pay | Admitting: Family Medicine

## 2016-01-21 ENCOUNTER — Other Ambulatory Visit (HOSPITAL_COMMUNITY): Payer: Self-pay | Admitting: Family

## 2016-01-22 ENCOUNTER — Encounter (HOSPITAL_COMMUNITY): Payer: Self-pay | Admitting: *Deleted

## 2016-01-22 NOTE — Progress Notes (Signed)
Bernerd Pho, PA for [employer] returned my page. She states pt should be fine for stopping Plavix and Eliquis since it's been over 6 months since her stent placement. [place] did state pt would need an EKG day of surgery to make sure she's not in A-fib.  Pt is diabetic. States her fasting blood sugar is usually around 140-160. Pt's last A1C was 8.8 on 9/717. Instructed pt to take 1/2 of her regular dose of Basaglar Insulin tonight (will take 10 units). Instructed pt to check blood sugar when she wakes up in the AM and every 2 hours until she leaves for the hospital. If blood sugar is 70 or below, treat with 1/2 cup of clear juice (apple or cranberry) and recheck blood sugar 15 minutes after drinking juice. If blood sugar continues to be 70 or below, call the Short Stay department and ask to speak to a nurse. Pt voiced understanding.

## 2016-01-22 NOTE — Progress Notes (Signed)
Spoke with pt for pre-op call. Pt has hx of stent placement on 03/06/15. Has been on Plavix and Eliquis. Pt states she stopped both for her surgery. When asked who instructed her to do so, she stated she did it on her own. Last dose was 01/20/16. Spoke with Dr. Glennon Mac, anesthesiologist about pt stopping this on her own. Dr. Glennon Mac requested that I call the cardiologist on call for Dr. Fransico Him and ask if pt is at risk for losing stent if she's already missed one dose and should we have pt start it back tonight? I have paged [place], [place] on call for [employer].

## 2016-01-23 ENCOUNTER — Encounter (HOSPITAL_COMMUNITY)
Admission: RE | Disposition: A | Discharge status: Home / Self Care | Payer: Self-pay | Source: Ambulatory Visit | Attending: Orthopedic Surgery

## 2016-01-23 ENCOUNTER — Encounter (HOSPITAL_COMMUNITY): Payer: Self-pay | Admitting: *Deleted

## 2016-01-23 ENCOUNTER — Ambulatory Visit (HOSPITAL_COMMUNITY): Payer: Managed Care, Other (non HMO) | Admitting: Anesthesiology

## 2016-01-23 ENCOUNTER — Ambulatory Visit (HOSPITAL_COMMUNITY)
Admission: RE | Admit: 2016-01-23 | Discharge: 2016-01-23 | Disposition: A | Discharge status: Home / Self Care | Payer: Managed Care, Other (non HMO) | Source: Ambulatory Visit | Attending: Orthopedic Surgery | Admitting: Orthopedic Surgery

## 2016-01-23 DIAGNOSIS — M19042 Primary osteoarthritis, left hand: Secondary | ICD-10-CM | POA: Diagnosis not present

## 2016-01-23 DIAGNOSIS — E1151 Type 2 diabetes mellitus with diabetic peripheral angiopathy without gangrene: Secondary | ICD-10-CM | POA: Insufficient documentation

## 2016-01-23 DIAGNOSIS — E1140 Type 2 diabetes mellitus with diabetic neuropathy, unspecified: Secondary | ICD-10-CM | POA: Diagnosis not present

## 2016-01-23 DIAGNOSIS — Z7901 Long term (current) use of anticoagulants: Secondary | ICD-10-CM | POA: Diagnosis not present

## 2016-01-23 DIAGNOSIS — E1122 Type 2 diabetes mellitus with diabetic chronic kidney disease: Secondary | ICD-10-CM | POA: Diagnosis not present

## 2016-01-23 DIAGNOSIS — E1169 Type 2 diabetes mellitus with other specified complication: Secondary | ICD-10-CM | POA: Insufficient documentation

## 2016-01-23 DIAGNOSIS — I129 Hypertensive chronic kidney disease with stage 1 through stage 4 chronic kidney disease, or unspecified chronic kidney disease: Secondary | ICD-10-CM | POA: Diagnosis not present

## 2016-01-23 DIAGNOSIS — M86671 Other chronic osteomyelitis, right ankle and foot: Secondary | ICD-10-CM | POA: Insufficient documentation

## 2016-01-23 DIAGNOSIS — I2510 Atherosclerotic heart disease of native coronary artery without angina pectoris: Secondary | ICD-10-CM | POA: Insufficient documentation

## 2016-01-23 DIAGNOSIS — M19041 Primary osteoarthritis, right hand: Secondary | ICD-10-CM | POA: Insufficient documentation

## 2016-01-23 DIAGNOSIS — Z7902 Long term (current) use of antithrombotics/antiplatelets: Secondary | ICD-10-CM | POA: Diagnosis not present

## 2016-01-23 DIAGNOSIS — Z79899 Other long term (current) drug therapy: Secondary | ICD-10-CM | POA: Diagnosis not present

## 2016-01-23 DIAGNOSIS — Z955 Presence of coronary angioplasty implant and graft: Secondary | ICD-10-CM | POA: Diagnosis not present

## 2016-01-23 DIAGNOSIS — G4730 Sleep apnea, unspecified: Secondary | ICD-10-CM | POA: Insufficient documentation

## 2016-01-23 DIAGNOSIS — I480 Paroxysmal atrial fibrillation: Secondary | ICD-10-CM | POA: Insufficient documentation

## 2016-01-23 DIAGNOSIS — E11621 Type 2 diabetes mellitus with foot ulcer: Secondary | ICD-10-CM | POA: Diagnosis not present

## 2016-01-23 DIAGNOSIS — Z87891 Personal history of nicotine dependence: Secondary | ICD-10-CM | POA: Diagnosis not present

## 2016-01-23 DIAGNOSIS — Z794 Long term (current) use of insulin: Secondary | ICD-10-CM | POA: Insufficient documentation

## 2016-01-23 DIAGNOSIS — Z7951 Long term (current) use of inhaled steroids: Secondary | ICD-10-CM | POA: Diagnosis not present

## 2016-01-23 DIAGNOSIS — J45909 Unspecified asthma, uncomplicated: Secondary | ICD-10-CM | POA: Insufficient documentation

## 2016-01-23 DIAGNOSIS — N189 Chronic kidney disease, unspecified: Secondary | ICD-10-CM | POA: Insufficient documentation

## 2016-01-23 DIAGNOSIS — L97519 Non-pressure chronic ulcer of other part of right foot with unspecified severity: Secondary | ICD-10-CM | POA: Diagnosis not present

## 2016-01-23 DIAGNOSIS — M869 Osteomyelitis, unspecified: Secondary | ICD-10-CM

## 2016-01-23 HISTORY — DX: Headache: R51

## 2016-01-23 HISTORY — DX: Thyrotoxicosis, unspecified without thyrotoxic crisis or storm: E05.90

## 2016-01-23 HISTORY — PX: AMPUTATION: SHX166

## 2016-01-23 HISTORY — DX: Other complications of anesthesia, initial encounter: T88.59XA

## 2016-01-23 HISTORY — DX: Headache, unspecified: R51.9

## 2016-01-23 HISTORY — DX: Adverse effect of unspecified anesthetic, initial encounter: T41.45XA

## 2016-01-23 LAB — GLUCOSE, CAPILLARY
GLUCOSE-CAPILLARY: 180 mg/dL — AB (ref 65–99)
GLUCOSE-CAPILLARY: 114 mg/dL — AB (ref 65–99)

## 2016-01-23 LAB — CBC
HCT: 39.3 % (ref 36.0–46.0)
HEMOGLOBIN: 12.6 g/dL (ref 12.0–15.0)
MCH: 28.1 pg (ref 26.0–34.0)
MCHC: 32.1 g/dL (ref 30.0–36.0)
MCV: 87.5 fL (ref 78.0–100.0)
PLATELETS: 436 10*3/uL — AB (ref 150–400)
RBC: 4.49 MIL/uL (ref 3.87–5.11)
RDW: 14.2 % (ref 11.5–15.5)
WBC: 6.9 10*3/uL (ref 4.0–10.5)

## 2016-01-23 LAB — BASIC METABOLIC PANEL
ANION GAP: 10 (ref 5–15)
BUN: 21 mg/dL — ABNORMAL HIGH (ref 6–20)
CHLORIDE: 103 mmol/L (ref 101–111)
CO2: 24 mmol/L (ref 22–32)
CREATININE: 1.1 mg/dL — AB (ref 0.44–1.00)
Calcium: 9.7 mg/dL (ref 8.9–10.3)
GFR calc non Af Amer: 52 mL/min — ABNORMAL LOW (ref >60)
Glucose, Bld: 164 mg/dL — ABNORMAL HIGH (ref 65–99)
Potassium: 4.6 mmol/L (ref 3.5–5.1)
SODIUM: 137 mmol/L (ref 135–145)

## 2016-01-23 LAB — PROTIME-INR
INR: 1.01
PROTHROMBIN TIME: 13.3 s (ref 11.4–15.2)

## 2016-01-23 SURGERY — AMPUTATION, FOOT, RAY
Anesthesia: Monitor Anesthesia Care | Site: Toe | Laterality: Right

## 2016-01-23 MED ORDER — FENTANYL CITRATE (PF) 100 MCG/2ML IJ SOLN
INTRAMUSCULAR | Status: AC
  Administered 2016-01-23: 50 ug via INTRAVENOUS
  Filled 2016-01-23: qty 2

## 2016-01-23 MED ORDER — LIDOCAINE 2% (20 MG/ML) 5 ML SYRINGE
INTRAMUSCULAR | Status: AC
  Filled 2016-01-23: qty 5

## 2016-01-23 MED ORDER — HYDROMORPHONE HCL 1 MG/ML IJ SOLN: 0.5000 mg | INTRAMUSCULAR | Status: DC | PRN

## 2016-01-23 MED ORDER — PROPOFOL 500 MG/50ML IV EMUL
INTRAVENOUS | Status: DC | PRN
  Administered 2016-01-23: 50 ug/kg/min via INTRAVENOUS

## 2016-01-23 MED ORDER — FENTANYL CITRATE (PF) 100 MCG/2ML IJ SOLN
50.0000 ug | Freq: Once | INTRAMUSCULAR | Status: AC
  Administered 2016-01-23: 50 ug via INTRAVENOUS

## 2016-01-23 MED ORDER — PROPOFOL 10 MG/ML IV BOLUS
INTRAVENOUS | Status: AC
  Filled 2016-01-23: qty 20

## 2016-01-23 MED ORDER — ONDANSETRON HCL 4 MG/2ML IJ SOLN
INTRAMUSCULAR | Status: AC
  Filled 2016-01-23: qty 2

## 2016-01-23 MED ORDER — CHLORHEXIDINE GLUCONATE 4 % EX LIQD: 60.0000 mL | Freq: Once | CUTANEOUS | Status: DC

## 2016-01-23 MED ORDER — CLINDAMYCIN PHOSPHATE 900 MG/50ML IV SOLN
900.0000 mg | INTRAVENOUS | Status: AC
  Administered 2016-01-23: 900 mg via INTRAVENOUS
  Filled 2016-01-23: qty 50

## 2016-01-23 MED ORDER — PHENYLEPHRINE 40 MCG/ML (10ML) SYRINGE FOR IV PUSH (FOR BLOOD PRESSURE SUPPORT)
PREFILLED_SYRINGE | INTRAVENOUS | Status: AC
  Filled 2016-01-23: qty 10

## 2016-01-23 MED ORDER — 0.9 % SODIUM CHLORIDE (POUR BTL) OPTIME
TOPICAL | Status: DC | PRN
  Administered 2016-01-23: 1000 mL

## 2016-01-23 MED ORDER — PROPOFOL 10 MG/ML IV BOLUS
INTRAVENOUS | Status: DC | PRN
  Administered 2016-01-23: 20 mg via INTRAVENOUS

## 2016-01-23 MED ORDER — FENTANYL CITRATE (PF) 100 MCG/2ML IJ SOLN
INTRAMUSCULAR | Status: AC
  Filled 2016-01-23: qty 2

## 2016-01-23 MED ORDER — TRAMADOL HCL 50 MG PO TABS: 50.0000 mg | ORAL_TABLET | Freq: Four times a day (QID) | ORAL | 0 refills | Status: DC | PRN

## 2016-01-23 MED ORDER — LIDOCAINE HCL (CARDIAC) 20 MG/ML IV SOLN
INTRAVENOUS | Status: DC | PRN
  Administered 2016-01-23: 40 mg via INTRAVENOUS

## 2016-01-23 MED ORDER — ONDANSETRON HCL 4 MG/2ML IJ SOLN: 4.0000 mg | Freq: Once | INTRAMUSCULAR | Status: DC | PRN

## 2016-01-23 MED ORDER — LACTATED RINGERS IV SOLN
INTRAVENOUS | Status: DC
  Administered 2016-01-23: 12:00:00 via INTRAVENOUS

## 2016-01-23 SURGICAL SUPPLY — 24 items
BNDG COHESIVE 4X5 TAN STRL (GAUZE/BANDAGES/DRESSINGS) ×3 IMPLANT
BNDG GAUZE ELAST 4 BULKY (GAUZE/BANDAGES/DRESSINGS) ×3 IMPLANT
COVER SURGICAL LIGHT HANDLE (MISCELLANEOUS) ×3 IMPLANT
DRAPE U-SHAPE 47X51 STRL (DRAPES) ×3 IMPLANT
DRSG ADAPTIC 3X8 NADH LF (GAUZE/BANDAGES/DRESSINGS) ×3 IMPLANT
DRSG PAD ABDOMINAL 8X10 ST (GAUZE/BANDAGES/DRESSINGS) ×3 IMPLANT
ELECT REM PT RETURN 9FT ADLT (ELECTROSURGICAL) ×3
ELECTRODE REM PT RTRN 9FT ADLT (ELECTROSURGICAL) ×1 IMPLANT
GAUZE SPONGE 4X4 12PLY STRL (GAUZE/BANDAGES/DRESSINGS) ×3 IMPLANT
GLOVE BIOGEL PI IND STRL 9 (GLOVE) ×1 IMPLANT
GLOVE BIOGEL PI INDICATOR 9 (GLOVE) ×2
GLOVE SURG ORTHO 9.0 STRL STRW (GLOVE) ×3 IMPLANT
GOWN STRL REUS W/ TWL LRG LVL3 (GOWN DISPOSABLE) ×1 IMPLANT
GOWN STRL REUS W/ TWL XL LVL3 (GOWN DISPOSABLE) ×2 IMPLANT
GOWN STRL REUS W/TWL LRG LVL3 (GOWN DISPOSABLE) ×2
GOWN STRL REUS W/TWL XL LVL3 (GOWN DISPOSABLE) ×4
KIT BASIN OR (CUSTOM PROCEDURE TRAY) ×3 IMPLANT
KIT ROOM TURNOVER OR (KITS) ×3 IMPLANT
NS IRRIG 1000ML POUR BTL (IV SOLUTION) ×3 IMPLANT
PACK ORTHO EXTREMITY (CUSTOM PROCEDURE TRAY) ×3 IMPLANT
SPONGE LAP 18X18 X RAY DECT (DISPOSABLE) ×3 IMPLANT
SUT ETHILON 2 0 PSLX (SUTURE) ×3 IMPLANT
TOWEL OR 17X26 10 PK STRL BLUE (TOWEL DISPOSABLE) ×3 IMPLANT
UNDERPAD 30X30 (UNDERPADS AND DIAPERS) ×3 IMPLANT

## 2016-01-23 NOTE — Anesthesia Procedure Notes (Signed)
Anesthesia Regional Block:  Ankle block  Pre-Anesthetic Checklist: ,, timeout performed, Correct Patient, Correct Site, Correct Laterality, Correct Procedure, Correct Position, site marked, Risks and benefits discussed,  Surgical consent,  Pre-op evaluation,  At surgeon's request and post-op pain management  Laterality: Right  Prep: chloraprep       Needles:  Injection technique: Single-shot      Needle Gauge: 25 and 25 G  Needle insertion depth: 2 cm   Additional Needles: Ankle block Narrative:  Start time: 01/23/2016 12:05 PM End time: 01/23/2016 12:10 PM Injection made incrementally with aspirations every 5 mL.  Performed by: Personally  Anesthesiologist: Kate Sable  Additional Notes: Pt accepts procedure w/ risks. 30cc ( 15cc 2% Lidocaine and 15cc 0.5% Marcaine ) GES

## 2016-01-23 NOTE — Progress Notes (Signed)
Orthopedic Tech Progress Note Patient Details:  Judith [name] 1951/02/04 [id number]  Ortho Devices Type of Ortho Device: Postop shoe/boot Ortho Device/Splint Interventions: Application   [place] Pink 01/23/2016, 2:48 PM

## 2016-01-23 NOTE — Transfer of Care (Signed)
Immediate Anesthesia Transfer of Care Note  Patient: Judith [name]  Procedure(s) Performed: Procedure(s): Right 3rd Ray Amputation (Right)  Patient Location: PACU  Anesthesia Type:MAC  Level of Consciousness: awake, alert , oriented and patient cooperative  Airway & Oxygen Therapy: Patient Spontanous Breathing and Patient connected to face mask oxygen  Post-op Assessment: Report given to RN and Post -op Vital signs reviewed and stable  Post vital signs: Reviewed  Last Vitals:  Vitals:   01/23/16 1113 01/23/16 1335  BP: 119/77 (P) 100/68  Pulse: 87 78  Resp: 20 16  Temp: 37.1 C 36.8 C    Last Pain:  Vitals:   01/23/16 1113  TempSrc: Oral         Complications: No apparent anesthesia complications

## 2016-01-23 NOTE — Anesthesia Procedure Notes (Signed)
Procedure Name: MAC Date/Time: 01/23/2016 1:14 PM Performed by: Jenne Campus Pre-anesthesia Checklist: Patient identified, Emergency Drugs available, Suction available, Patient being monitored and Timeout performed

## 2016-01-23 NOTE — Anesthesia Preprocedure Evaluation (Signed)
Anesthesia Evaluation  Patient identified by MRN, date of birth, ID band Patient awake    Reviewed: Allergy & Precautions, NPO status , Patient's Chart, lab work & pertinent test results  History of Anesthesia Complications (+) history of anesthetic complications  Airway Mallampati: II  TM Distance: <3 FB   Mouth opening: Limited Mouth Opening  Dental   Pulmonary asthma , sleep apnea , former smoker,    Pulmonary exam normal        Cardiovascular hypertension, + angina + CAD and + Cardiac Stents  Normal cardiovascular exam     Neuro/Psych  Headaches,  Neuromuscular disease    GI/Hepatic GERD  ,  Endo/Other  diabetes, Type 2, Insulin Dependent, Oral Hypoglycemic AgentsHyperthyroidism   Renal/GU Renal InsufficiencyRenal disease     Musculoskeletal  (+) Arthritis ,   Abdominal   Peds  Hematology   Anesthesia Other Findings   Reproductive/Obstetrics                             Anesthesia Physical Anesthesia Plan  ASA: III  Anesthesia Plan: Regional and MAC   Post-op Pain Management:    Induction: Intravenous  Airway Management Planned: Simple Face Mask  Additional Equipment:   Intra-op Plan:   Post-operative Plan:   Informed Consent: I have reviewed the patients History and Physical, chart, labs and discussed the procedure including the risks, benefits and alternatives for the proposed anesthesia with the patient or authorized representative who has indicated his/her understanding and acceptance.     Plan Discussed with: Anesthesiologist, Surgeon and CRNA  Anesthesia Plan Comments:         Anesthesia Quick Evaluation

## 2016-01-23 NOTE — Op Note (Signed)
01/23/2016  1:26 PM  PATIENT:  Judith [name]    PRE-OPERATIVE DIAGNOSIS:  Osteomyelitis Right 3rd Toe  POST-OPERATIVE DIAGNOSIS:  Same  PROCEDURE:  Right 3rd Ray Amputation  SURGEON:  [name], MD  PHYSICIAN ASSISTANT:None ANESTHESIA:   General  PREOPERATIVE INDICATIONS:  [name] is a  65 y.o. female with a diagnosis of Osteomyelitis Right 3rd Toe who failed conservative measures and elected for surgical management.    The risks benefits and alternatives were discussed with the patient preoperatively including but not limited to the risks of infection, bleeding, nerve injury, cardiopulmonary complications, the need for revision surgery, among others, and the patient was willing to proceed.  OPERATIVE IMPLANTS: None  OPERATIVE FINDINGS: Good petechial bleeding  OPERATIVE PROCEDURE: Patient brought the operating room after undergoing a regional block. After adequate levels anesthesia obtained patient's right lower extremity was prepped using ChloraPrep and draped into a sterile field. A timeout was called. Patient received clindamycin 900 mg IV preoperatively. A racquet incision was made around the toe extending dorsally. The third metatarsal was resected through the shaft of the metatarsal the toe and ulcerative tissue were resected in 1 block of tissue. Electrocautery was used for hemostasis. The wound was closed using 2-0 nylon. A sterile compressive dressing was applied. Patient was taken to the PACU in stable condition and a prescription for Ultram for postoperative pain was provided touchdown weightbearing elevation follow-up in the office in 1 week.

## 2016-01-23 NOTE — Anesthesia Postprocedure Evaluation (Signed)
Anesthesia Post Note  Patient: Judith [name]  Procedure(s) Performed: Procedure(s) (LRB): Right 3rd Ray Amputation (Right)  Patient location during evaluation: PACU Anesthesia Type: Regional Level of consciousness: awake Pain management: pain level controlled Vital Signs Assessment: post-procedure vital signs reviewed and stable Respiratory status: spontaneous breathing Cardiovascular status: stable Postop Assessment: no signs of nausea or vomiting Anesthetic complications: no    Last Vitals:  Vitals:   01/23/16 1415 01/23/16 1430  BP: 120/77 137/77  Pulse:  79  Resp:  16  Temp: 36.7 C     Last Pain:  Vitals:   01/23/16 1430  TempSrc:   PainSc: 0-No pain                 Makeba Delcastillo

## 2016-01-23 NOTE — H&P (Signed)
Judith [name] is an 65 y.o. female.   Chief Complaint: Osteomyelitis ulceration right foot third toe HPI: Patient is a 65 year old woman with diabetic insensate neuropathy peripheral vascular disease with ulceration osteomyelitis right third toe  Past Medical History:  Diagnosis Date  . Abnormal EKG 07/31/2013  . Arthritis    "hands" (03/06/2015)  . Asthma   . Carpal tunnel syndrome, bilateral   . Carpal tunnel syndrome, bilateral   . Chronic kidney disease   . Complication of anesthesia    slow to wake up  . Coronary artery disease     2 v CAD with CTO of the RCA and high grade bifurcational LCx/OM stenosis. S/P PCI DES x 2 to the LCx/OM.  Marland Kitchen Diabetic peripheral neuropathy ([place]) "since 1996"  . GERD (gastroesophageal reflux disease)   . Goiter   . Headache    migraines prior to menopause  . History of shingles 06/01/2013  . Hyperlipidemia   . Hyperlipidemia LDL goal <70 10/13/2015  . Hypertension   . Hyperthyroidism   . PAF (paroxysmal atrial fibrillation) ([place]) 04/29/2015   CHADS2VASC score of 4 now on Apixaban  . Paroxysmal atrial fibrillation with rapid ventricular response ([place]) 11/15/2015  . Pneumonia ~ 1976  . Tremors of nervous system   . Type II diabetes mellitus (HCC)    insulin dependent    Past Surgical History:  Procedure Laterality Date  . ABDOMINAL HYSTERECTOMY  1988   age 27; CERVICAL DYSPLASIA; ovaries intact.   Marland Kitchen CARDIAC CATHETERIZATION N/A 02/27/2015   Procedure: Left Heart Cath and Coronary Angiography;  Surgeon: [name], MD; LAD 40%, mCFX 80%, OM 70%, RCA 100% calcified       . CARDIAC CATHETERIZATION N/A 03/06/2015   Procedure: Coronary Stent Intervention;  Surgeon: [name], MD;  Location: [facility];  Service: Cardiovascular;  Laterality: N/A;  Mid CX 3.50x12 promus DES w/ 0% resdual and Prox OM1 2.50x20 promus DES w/ 20% residual  . CARPAL TUNNEL RELEASE Right Nov 2015  . CARPAL TUNNEL RELEASE Right 1992; 05/2014   [name]; [facility]  . CESAREAN SECTION  1982; 1984  . FOOT NEUROMA SURGERY Bilateral 2000  . KNEE ARTHROSCOPY Right ~ 2003   "meniscus repair"  . SHOULDER OPEN ROTATOR CUFF REPAIR Right 1996; 1998   "w/fracture repair"  . THYROID SURGERY  2000   "removed lots of nodules"  . TONSILLECTOMY  1976    Family History  Problem Relation Age of Onset  . Cancer Mother 53    bronchial cancer  . Breast cancer Mother   . Lung cancer Mother   . Hypertension Father   . COPD Father   . Heart disease Father 37    CAD with cardiac stenting  . Heart attack Father   . Parkinson's disease Father   . Allergies Sister   . Breast cancer Maternal Grandmother   . Emphysema Maternal Grandfather   . Leukemia Paternal Grandmother   . Emphysema Paternal Grandfather   . Thyroid disease Neg Hx    Social History:  reports that she quit smoking about 10 months ago. Her smoking use included Cigarettes. She smoked 0.00 packs per day for 41.00 years. She has never used smokeless tobacco. She reports that she does not drink alcohol or use drugs.  Allergies:  Allergies  Allergen Reactions  . Novolog [Insulin Aspart] Shortness Of Breath    "breathing problems"  . Codeine Nausea And Vomiting    HIGH DOSES-SEVERE VOMITING  . Iodine Other (See  Comments)    MUST HAVE BENADRYL PRIOR TO PROCEDURE AND RIGHT BEFORE TREATMENT TO COUNTERACT REACTION-BLISTERING REACTION DERMATOLOGICAL  . Penicillins Itching, Rash and Other (See Comments)    Has patient had a PCN reaction causing immediate rash, facial/tongue/throat swelling, SOB or lightheadedness with hypotension: Yes Has patient had a PCN reaction causing severe rash involving mucus membranes or skin necrosis: No Has patient had a PCN reaction that required hospitalization No Has patient had a PCN reaction occurring within the last 10 years: No If all of the above answers are "NO", then may proceed with Cephalosporin use.  CHEST SIZED RASH AND ITCHING   . Propofol Other (See  Comments)    "Breathing problems - asthma attack"  . Ace Inhibitors Cough  . Demerol [Meperidine] Nausea And Vomiting  . Neosporin [Neomycin-Bacitracin Zn-Polymyx] Itching and Rash    MAKES REACTIONS WORSE WHEN USING AS PROPHYLACTIC  . Percocet [Oxycodone-Acetaminophen] Rash  . Tape Itching and Rash    No prescriptions prior to admission.    No results found for this or any previous visit (from the past 48 hour(s)). No results found.  Review of Systems  All other systems reviewed and are negative.   There were no vitals taken for this visit. Physical Exam  On examination patient has a palpable dorsalis pedis pulse she has sausage digit swelling cellulitis ulceration of the third toe right foot. Radiographs shows chronic osteomyelitis. Assessment/Plan Assessment: Diabetic insensate neuropathy for glasses disease with chronic osteomyelitis right foot third toe.  Plan: We'll plan for right foot third Ray amputation. Risk and benefits were discussed patient states she understands wish to proceed at this time. Risk of a higher level amputation risk of nonhealing of the surgical incision.  [name], MD 01/23/2016, 6:52 AM

## 2016-01-26 ENCOUNTER — Encounter (HOSPITAL_COMMUNITY): Payer: Self-pay | Admitting: Orthopedic Surgery

## 2016-02-02 ENCOUNTER — Other Ambulatory Visit: Payer: Self-pay | Admitting: Family Medicine

## 2016-02-05 ENCOUNTER — Ambulatory Visit (INDEPENDENT_AMBULATORY_CARE_PROVIDER_SITE_OTHER): Payer: Managed Care, Other (non HMO) | Admitting: Family Medicine

## 2016-02-05 VITALS — BP 130/80 | HR 91 | Temp 98.0°F | Resp 18 | Ht 65.0 in | Wt 178.0 lb

## 2016-02-05 DIAGNOSIS — M869 Osteomyelitis, unspecified: Secondary | ICD-10-CM | POA: Diagnosis not present

## 2016-02-05 DIAGNOSIS — I2510 Atherosclerotic heart disease of native coronary artery without angina pectoris: Secondary | ICD-10-CM | POA: Diagnosis not present

## 2016-02-05 DIAGNOSIS — F418 Other specified anxiety disorders: Secondary | ICD-10-CM | POA: Diagnosis not present

## 2016-02-05 DIAGNOSIS — I480 Paroxysmal atrial fibrillation: Secondary | ICD-10-CM

## 2016-02-05 DIAGNOSIS — I10 Essential (primary) hypertension: Secondary | ICD-10-CM

## 2016-02-05 DIAGNOSIS — Z23 Encounter for immunization: Secondary | ICD-10-CM

## 2016-02-05 DIAGNOSIS — E1169 Type 2 diabetes mellitus with other specified complication: Secondary | ICD-10-CM

## 2016-02-05 DIAGNOSIS — E7800 Pure hypercholesterolemia, unspecified: Secondary | ICD-10-CM

## 2016-02-05 LAB — CBC WITH DIFFERENTIAL/PLATELET
BASOS ABS: 0 {cells}/uL (ref 0–200)
Basophils Relative: 0 %
EOS ABS: 198 {cells}/uL (ref 15–500)
Eosinophils Relative: 2 %
HEMATOCRIT: 34.1 % — AB (ref 35.0–45.0)
HEMOGLOBIN: 11.3 g/dL — AB (ref 11.7–15.5)
LYMPHS ABS: 1782 {cells}/uL (ref 850–3900)
Lymphocytes Relative: 18 %
MCH: 28.4 pg (ref 27.0–33.0)
MCHC: 33.1 g/dL (ref 32.0–36.0)
MCV: 85.7 fL (ref 80.0–100.0)
MONOS PCT: 6 %
MPV: 8.9 fL (ref 7.5–12.5)
Monocytes Absolute: 594 cells/uL (ref 200–950)
NEUTROS ABS: 7326 {cells}/uL (ref 1500–7800)
Neutrophils Relative %: 74 %
Platelets: 357 10*3/uL (ref 140–400)
RBC: 3.98 MIL/uL (ref 3.80–5.10)
RDW: 14 % (ref 11.0–15.0)
WBC: 9.9 10*3/uL (ref 3.8–10.8)

## 2016-02-05 LAB — COMPREHENSIVE METABOLIC PANEL
ALBUMIN: 3.3 g/dL — AB (ref 3.6–5.1)
ALT: 8 U/L (ref 6–29)
AST: 12 U/L (ref 10–35)
Alkaline Phosphatase: 97 U/L (ref 33–130)
BUN: 18 mg/dL (ref 7–25)
CALCIUM: 8.9 mg/dL (ref 8.6–10.4)
CHLORIDE: 95 mmol/L — AB (ref 98–110)
CO2: 27 mmol/L (ref 20–31)
Creat: 0.82 mg/dL (ref 0.50–0.99)
Glucose, Bld: 336 mg/dL — ABNORMAL HIGH (ref 65–99)
POTASSIUM: 4.3 mmol/L (ref 3.5–5.3)
Sodium: 137 mmol/L (ref 135–146)
TOTAL PROTEIN: 6.4 g/dL (ref 6.1–8.1)
Total Bilirubin: 0.4 mg/dL (ref 0.2–1.2)

## 2016-02-05 LAB — LIPID PANEL
CHOLESTEROL: 187 mg/dL (ref 125–200)
HDL: 32 mg/dL — ABNORMAL LOW (ref >=46)
LDL Cholesterol: 79 mg/dL (ref <130)
TRIGLYCERIDES: 380 mg/dL — AB (ref <150)
Total CHOL/HDL Ratio: 5.8 Ratio — ABNORMAL HIGH (ref <=5.0)
VLDL: 76 mg/dL — AB (ref <30)

## 2016-02-05 MED ORDER — PANTOPRAZOLE SODIUM 40 MG PO TBEC: 40.0000 mg | DELAYED_RELEASE_TABLET | Freq: Every day | ORAL | 3 refills | Status: DC

## 2016-02-05 MED ORDER — BUPROPION HCL ER (SR) 150 MG PO TB12: 150.0000 mg | ORAL_TABLET | Freq: Two times a day (BID) | ORAL | 3 refills | Status: DC

## 2016-02-05 MED ORDER — TRAMADOL HCL 50 MG PO TABS: ORAL_TABLET | ORAL | 1 refills | Status: DC

## 2016-02-05 MED ORDER — FLUOXETINE HCL 20 MG PO CAPS: 20.0000 mg | ORAL_CAPSULE | Freq: Every day | ORAL | 3 refills | Status: DC

## 2016-02-05 MED ORDER — GABAPENTIN 300 MG PO CAPS: ORAL_CAPSULE | ORAL | 1 refills | Status: DC

## 2016-02-05 NOTE — Progress Notes (Signed)
Pt became nauseated with episode of vomiting Rechecked BP 130/84/ 80 Offered Zofran, declined States, overheated

## 2016-02-05 NOTE — Progress Notes (Signed)
By signing my name below, I, Mesha Guinyard, attest that this documentation has been prepared under the direction and in the presence of Reginia Forts, MD.  Electronically Signed: Verlee Monte, Medical Scribe. 02/05/2016. 10:17 AM.  Subjective:    Patient ID: Judith [name], female    DOB: Oct 30, 1950, 65 y.o.   MRN: [id number]   02/05/2016  Follow-up (rt foot ulcer)   HPI  HPI Comments: Judith [name] is a 65 y.o. female with a PMHx of HTN, DM and A.Fib who presents to the [facility] for right foot ulcer hospital follow-up. Pt had Ray amputation for chronic osteomyelitis of her right foot, third toe almost 3 weeks ago. Pt drive here for the first time in 2 weeks. Pt states the first week she noticed her toe discolored the infection wasn't in her bone, the next week it was in her bone and the following week she had it amputated. Pt was told her toe wasn't healing right last Friday (6 days ago) at her follow-up. Pt states this is going really quick and she initially thought some medications were going to get taken away. Pt checks her toe every night and reports drainage from her toe. Pt was told to take doxycycline BID, and to stay off her feet.  Lab Results  Component Value Date   HGBA1C 8.8 01/15/2016   Lab Results  Component Value Date   MICROALBUR 22.7 (H) 12/06/2014   A Fib.: Pt is on now on sotalol and she no longer has to get her heart shocked to put it back in rhythm, after doing it for 3 weeks. Pt was in the hospital for 3 days. Pt has an appt with cardiologist in Nov. Pt mentons after being taken off of her heart medications she no longer has diarrhea after having it for years. Pt denies chest pain, dyspnea, diarrhea, abdominal pain, constipation.  Medications: Metoprolol, tramadol BID, cozaar, plavix, eliquis, wellbutrin- ran out for the past 2 weeks, gabapentin- 3 in the am, and 3 in the pm, zofran PRN but hasn't used it in a while. Pt no longer uses albuterol,  and she only used it for allergic asthma.  Pt no longer uses claritin because her podiatrist didn't want her to take anything OTC for her foot recovery.  Review of Systems  Constitutional: Negative for chills, diaphoresis, fatigue and fever.  Eyes: Negative for visual disturbance.  Respiratory: Negative for cough and shortness of breath.   Cardiovascular: Negative for chest pain, palpitations and leg swelling.  Gastrointestinal: Negative for abdominal pain, constipation, diarrhea, nausea and vomiting.  Endocrine: Negative for cold intolerance, heat intolerance, polydipsia, polyphagia and polyuria.  Musculoskeletal: Positive for gait problem.  Skin: Positive for color change and wound.  Neurological: Positive for numbness. Negative for dizziness, tremors, seizures, syncope, facial asymmetry, speech difficulty, weakness, light-headedness and headaches.   Past Medical History:  Diagnosis Date  . Abnormal EKG 07/31/2013  . Arthritis    "hands" (03/06/2015)  . Asthma   . Carpal tunnel syndrome, bilateral   . Carpal tunnel syndrome, bilateral   . Chronic kidney disease   . Complication of anesthesia    slow to wake up  . Coronary artery disease     2 v CAD with CTO of the RCA and high grade bifurcational LCx/OM stenosis. S/P PCI DES x 2 to the LCx/OM.  Marland Kitchen Diabetic peripheral neuropathy ([place]) "since 1996"  . GERD (gastroesophageal reflux disease)   . Goiter   . Headache  migraines prior to menopause  . History of shingles 06/01/2013  . Hyperlipidemia   . Hyperlipidemia LDL goal <70 10/13/2015  . Hypertension   . Hyperthyroidism   . PAF (paroxysmal atrial fibrillation) ([place]) 04/29/2015   CHADS2VASC score of 4 now on Apixaban  . Paroxysmal atrial fibrillation with rapid ventricular response ([place]) 11/15/2015  . Pneumonia ~ 1976  . Tremors of nervous system   . Type II diabetes mellitus (HCC)    insulin dependent   Past Surgical History:  Procedure Laterality Date  . ABDOMINAL  HYSTERECTOMY  1988   age 40; CERVICAL DYSPLASIA; ovaries intact.   . AMPUTATION Right 01/23/2016   Procedure: Right 3rd Ray Amputation;  Surgeon: [name], MD;  Location: [facility];  Service: Orthopedics;  Laterality: Right;  . AMPUTATION Right 02/13/2016   Procedure: Right Transmetatarsal Amputation;  Surgeon: [name], MD;  Location: [facility];  Service: Orthopedics;  Laterality: Right;  . CARDIAC CATHETERIZATION N/A 02/27/2015   Procedure: Left Heart Cath and Coronary Angiography;  Surgeon: [name], MD; LAD 40%, mCFX 80%, OM 70%, RCA 100% calcified       . CARDIAC CATHETERIZATION N/A 03/06/2015   Procedure: Coronary Stent Intervention;  Surgeon: [name], MD;  Location: [facility];  Service: Cardiovascular;  Laterality: N/A;  Mid CX 3.50x12 promus DES w/ 0% resdual and Prox OM1 2.50x20 promus DES w/ 20% residual  . CARPAL TUNNEL RELEASE Right Nov 2015  . CARPAL TUNNEL RELEASE Right 1992; 05/2014   [name]; [place]  . CESAREAN SECTION  1982; 1984  . FOOT NEUROMA SURGERY Bilateral 2000  . KNEE ARTHROSCOPY Right ~ 2003   "meniscus repair"  . SHOULDER OPEN ROTATOR CUFF REPAIR Right 1996; 1998   "w/fracture repair"  . THYROID SURGERY  2000   "removed lots of nodules"  . TONSILLECTOMY  1976   Allergies  Allergen Reactions  . Novolog [Insulin Aspart] Shortness Of Breath    "breathing problems"  . Codeine Nausea And Vomiting    HIGH DOSES-SEVERE VOMITING  . Iodine Other (See Comments)    MUST HAVE BENADRYL PRIOR TO PROCEDURE AND RIGHT BEFORE TREATMENT TO COUNTERACT REACTION-BLISTERING REACTION DERMATOLOGICAL  . Penicillins Itching, Rash and Other (See Comments)    Has patient had a PCN reaction causing immediate rash, facial/tongue/throat swelling, SOB or lightheadedness with hypotension: Yes Has patient had a PCN reaction causing severe rash involving mucus membranes or skin necrosis: No Has patient had a PCN reaction that required hospitalization No Has patient  had a PCN reaction occurring within the last 10 years: No If all of the above answers are "NO", then may proceed with Cephalosporin use.  CHEST SIZED RASH AND ITCHING   . Propofol Other (See Comments)    "Breathing problems - asthma attack"  . Ace Inhibitors Cough  . Demerol [Meperidine] Nausea And Vomiting  . Neosporin [Neomycin-Bacitracin Zn-Polymyx] Itching and Rash    MAKES REACTIONS WORSE WHEN USING AS PROPHYLACTIC  . Percocet [Oxycodone-Acetaminophen] Rash  . Tape Itching and Rash    Social History   Social History  . Marital status: Divorced    Spouse name: N/A  . Number of children: 2  . Years of education: Masters   Occupational History  . Food Therapist, nutritional    Social History Main Topics  . Smoking status: Former Smoker    Packs/day: 0.00    Years: 41.00    Types: Cigarettes    Quit date: 03/05/2015  . Smokeless tobacco: Never Used  Comment: 04/29/2015 "quit smoking cigarettes 02/27/2015"  . Alcohol use No  . Drug use: No  . Sexual activity: Not Currently    Birth control/ protection: Post-menopausal, Surgical   Other Topics Concern  . 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      Lives with sister in [place].      Tobacco: 1 ppd x 41 years - quit 2016      Alcohol: none      Drugs: none      Exercise:  Walking in neighborhood; physical job.   Right-handed.   2 cups caffeine daily.      Family History  Problem Relation Age of Onset  . Cancer Mother 22    bronchial cancer  . Breast cancer Mother   . Lung cancer Mother   . Hypertension Father   . COPD Father   . Heart disease Father 41    CAD with cardiac stenting  . Heart attack Father   . Parkinson's disease Father   . Allergies Sister   . Breast cancer  Maternal Grandmother   . Emphysema Maternal Grandfather   . Leukemia Paternal Grandmother   . Emphysema Paternal Grandfather   . Thyroid disease Neg Hx        Objective:    BP 130/80 (BP Location: Right Arm, Patient Position: Sitting, Cuff Size: Normal)   Pulse 91   Temp 98 F (36.7 C) (Oral)   Resp 18   Ht 5\' 5"  (1.651 m)   Wt 178 lb (80.7 kg)   SpO2 98%   BMI 29.62 kg/m  Physical Exam  Constitutional: She is oriented to person, place, and time. She appears well-developed and well-nourished. No distress.  HENT:  Head: Normocephalic and atraumatic.  Right Ear: External ear normal.  Left Ear: External ear normal.  Nose: Nose normal.  Mouth/Throat: Oropharynx is clear and moist.  Eyes: Conjunctivae and EOM are normal. Pupils are equal, round, and reactive to light.  Neck: Normal range of motion and full passive range of motion without pain. Neck supple. No JVD present. Carotid bruit is not present. No thyromegaly present.  Cardiovascular: Normal rate, regular rhythm and normal heart sounds.  Exam reveals no gallop and no friction rub.   No murmur heard. Pulmonary/Chest: Effort normal and breath sounds normal. She has no wheezes. She has no rales.  Abdominal: Soft. Bowel sounds are normal. She exhibits no distension and no mass. There is no tenderness. There is no rebound and no guarding.  Musculoskeletal:       Right shoulder: Normal.       Left shoulder: Normal.       Cervical back: Normal.  R foot bandaged.  Lymphadenopathy:    She has no cervical adenopathy.  Neurological: She is alert and oriented to person, place, and time. She has normal reflexes. No cranial nerve deficit. She exhibits normal muscle tone. Coordination normal.  Skin: Skin is warm and dry. No rash noted. She is not diaphoretic. No erythema. No pallor.  Psychiatric: She has a normal mood and affect. Her behavior is normal. Judgment and thought content normal.  Nursing note and vitals  reviewed.    Assessment & Plan:   1. Diabetic osteomyelitis ([place])   2.  PAF (paroxysmal atrial fibrillation) ([place])   3. Coronary artery disease involving native coronary artery of native heart without angina pectoris   4. Pure hypercholesterolemia   5. Essential hypertension   6. Need for prophylactic vaccination and inoculation against influenza   7. Need for prophylactic vaccination against Streptococcus pneumoniae (pneumococcus)   8. Depression with anxiety     -admission records reviewed in detail during visit.  Has upcoming appointment with Dr. Sharol Given for onging drainage of site of amputation. -pt coping well emotionally with recent acute medical issues.   -obtain labs. -refills provided. -s/p Pneumovax and influenza vaccines.  Orders Placed This Encounter  Procedures  . Flu Vaccine QUAD 36+ mos IM  . Pneumococcal polysaccharide vaccine 23-valent greater than or equal to 2yo subcutaneous/IM  . Lipid panel    Order Specific Question:   Has the patient fasted?    Answer:   Yes  . CBC with Differential/Platelet  . Comprehensive metabolic panel    Order Specific Question:   Has the patient fasted?    Answer:   Yes   Meds ordered this encounter  Medications  . doxycycline (VIBRAMYCIN) 100 MG capsule    Sig: Take 100 mg by mouth 2 (two) times daily. 30 day course filled 01/30/16  . traMADol (ULTRAM) 50 MG tablet    Sig: TAKE ONE TO TWO TABLETS BY MOUTH AT BEDTIME AS NEEDED NEUROPATHY    Dispense:  180 tablet    Refill:  1  . pantoprazole (PROTONIX) 40 MG tablet    Sig: Take 1 tablet (40 mg total) by mouth daily.    Dispense:  90 tablet    Refill:  3  . gabapentin (NEURONTIN) 300 MG capsule    Sig: TAKE 3 CAPSULES BY MOUTH AT morning AND 3 CAPSULES AT BEDTIME    Dispense:  540 capsule    Refill:  1  . FLUoxetine (PROZAC) 20 MG capsule    Sig: Take 1 capsule (20 mg total) by mouth at bedtime.    Dispense:  90 capsule    Refill:  3  . buPROPion (WELLBUTRIN SR) 150 MG 12  hr tablet    Sig: Take 1 tablet (150 mg total) by mouth 2 (two) times daily.    Dispense:  180 tablet    Refill:  3    Return in about 4 months (around 06/06/2016) for recheck.  I personally performed the services described in this documentation, which was scribed in my presence. The recorded information has been reviewed and considered.  Kristi Elayne Guerin, M.D. [facility] [address] [place], [place]  [address] [phone] phone [phone] fax

## 2016-02-11 ENCOUNTER — Ambulatory Visit (INDEPENDENT_AMBULATORY_CARE_PROVIDER_SITE_OTHER): Payer: Managed Care, Other (non HMO) | Admitting: Orthopedic Surgery

## 2016-02-11 ENCOUNTER — Other Ambulatory Visit (HOSPITAL_COMMUNITY): Payer: Self-pay | Admitting: Family

## 2016-02-11 DIAGNOSIS — I87311 Chronic venous hypertension (idiopathic) with ulcer of right lower extremity: Secondary | ICD-10-CM

## 2016-02-11 DIAGNOSIS — E1142 Type 2 diabetes mellitus with diabetic polyneuropathy: Secondary | ICD-10-CM

## 2016-02-12 NOTE — Progress Notes (Signed)
Several unsuccessful attempts have been made to contact pt; lvm with pre-op instructions only for DOS. Pt made aware of diabetes protocol, to not take Metformin on DOS and to take half dose of Glargine insulin tonight ( 10 units instead of 20).

## 2016-02-13 ENCOUNTER — Ambulatory Visit (HOSPITAL_COMMUNITY)
Admission: RE | Admit: 2016-02-13 | Discharge: 2016-02-13 | Disposition: A | Discharge status: Home / Self Care | Payer: Managed Care, Other (non HMO) | Source: Ambulatory Visit | Attending: Orthopedic Surgery | Admitting: Orthopedic Surgery

## 2016-02-13 ENCOUNTER — Encounter (HOSPITAL_COMMUNITY): Payer: Self-pay | Admitting: Anesthesiology

## 2016-02-13 ENCOUNTER — Ambulatory Visit (HOSPITAL_COMMUNITY): Payer: Managed Care, Other (non HMO) | Admitting: Anesthesiology

## 2016-02-13 ENCOUNTER — Encounter (HOSPITAL_COMMUNITY)
Admission: RE | Disposition: A | Discharge status: Home / Self Care | Payer: Self-pay | Source: Ambulatory Visit | Attending: Orthopedic Surgery

## 2016-02-13 ENCOUNTER — Encounter (HOSPITAL_COMMUNITY): Payer: Self-pay

## 2016-02-13 ENCOUNTER — Emergency Department (HOSPITAL_COMMUNITY)
Admission: EM | Admit: 2016-02-13 | Discharge: 2016-02-13 | Disposition: A | Discharge status: Home / Self Care | Payer: Managed Care, Other (non HMO) | Attending: Emergency Medicine | Admitting: Emergency Medicine

## 2016-02-13 DIAGNOSIS — Z89421 Acquired absence of other right toe(s): Secondary | ICD-10-CM

## 2016-02-13 DIAGNOSIS — M79671 Pain in right foot: Secondary | ICD-10-CM | POA: Diagnosis present

## 2016-02-13 DIAGNOSIS — Z87891 Personal history of nicotine dependence: Secondary | ICD-10-CM | POA: Diagnosis not present

## 2016-02-13 DIAGNOSIS — I129 Hypertensive chronic kidney disease with stage 1 through stage 4 chronic kidney disease, or unspecified chronic kidney disease: Secondary | ICD-10-CM | POA: Insufficient documentation

## 2016-02-13 DIAGNOSIS — E785 Hyperlipidemia, unspecified: Secondary | ICD-10-CM

## 2016-02-13 DIAGNOSIS — E11628 Type 2 diabetes mellitus with other skin complications: Secondary | ICD-10-CM

## 2016-02-13 DIAGNOSIS — I2510 Atherosclerotic heart disease of native coronary artery without angina pectoris: Secondary | ICD-10-CM

## 2016-02-13 DIAGNOSIS — N189 Chronic kidney disease, unspecified: Secondary | ICD-10-CM | POA: Insufficient documentation

## 2016-02-13 DIAGNOSIS — E1151 Type 2 diabetes mellitus with diabetic peripheral angiopathy without gangrene: Secondary | ICD-10-CM | POA: Insufficient documentation

## 2016-02-13 DIAGNOSIS — I480 Paroxysmal atrial fibrillation: Secondary | ICD-10-CM | POA: Insufficient documentation

## 2016-02-13 DIAGNOSIS — Z79899 Other long term (current) drug therapy: Secondary | ICD-10-CM | POA: Insufficient documentation

## 2016-02-13 DIAGNOSIS — E1142 Type 2 diabetes mellitus with diabetic polyneuropathy: Secondary | ICD-10-CM | POA: Diagnosis not present

## 2016-02-13 DIAGNOSIS — Z7902 Long term (current) use of antithrombotics/antiplatelets: Secondary | ICD-10-CM | POA: Insufficient documentation

## 2016-02-13 DIAGNOSIS — E1140 Type 2 diabetes mellitus with diabetic neuropathy, unspecified: Secondary | ICD-10-CM | POA: Diagnosis not present

## 2016-02-13 DIAGNOSIS — J45909 Unspecified asthma, uncomplicated: Secondary | ICD-10-CM | POA: Insufficient documentation

## 2016-02-13 DIAGNOSIS — Z7984 Long term (current) use of oral hypoglycemic drugs: Secondary | ICD-10-CM | POA: Diagnosis not present

## 2016-02-13 DIAGNOSIS — Y835 Amputation of limb(s) as the cause of abnormal reaction of the patient, or of later complication, without mention of misadventure at the time of the procedure: Secondary | ICD-10-CM | POA: Insufficient documentation

## 2016-02-13 DIAGNOSIS — T8781 Dehiscence of amputation stump: Secondary | ICD-10-CM | POA: Insufficient documentation

## 2016-02-13 DIAGNOSIS — E0590 Thyrotoxicosis, unspecified without thyrotoxic crisis or storm: Secondary | ICD-10-CM

## 2016-02-13 DIAGNOSIS — Z794 Long term (current) use of insulin: Secondary | ICD-10-CM

## 2016-02-13 DIAGNOSIS — E1122 Type 2 diabetes mellitus with diabetic chronic kidney disease: Secondary | ICD-10-CM

## 2016-02-13 DIAGNOSIS — Z4801 Encounter for change or removal of surgical wound dressing: Secondary | ICD-10-CM | POA: Insufficient documentation

## 2016-02-13 DIAGNOSIS — L089 Local infection of the skin and subcutaneous tissue, unspecified: Principal | ICD-10-CM

## 2016-02-13 DIAGNOSIS — Z5189 Encounter for other specified aftercare: Secondary | ICD-10-CM

## 2016-02-13 DIAGNOSIS — Z955 Presence of coronary angioplasty implant and graft: Secondary | ICD-10-CM | POA: Insufficient documentation

## 2016-02-13 DIAGNOSIS — L02611 Cutaneous abscess of right foot: Secondary | ICD-10-CM | POA: Diagnosis not present

## 2016-02-13 DIAGNOSIS — Z7901 Long term (current) use of anticoagulants: Secondary | ICD-10-CM | POA: Insufficient documentation

## 2016-02-13 DIAGNOSIS — K219 Gastro-esophageal reflux disease without esophagitis: Secondary | ICD-10-CM | POA: Insufficient documentation

## 2016-02-13 HISTORY — PX: AMPUTATION: SHX166

## 2016-02-13 LAB — PROTIME-INR
INR: 1
PROTHROMBIN TIME: 13.2 s (ref 11.4–15.2)

## 2016-02-13 LAB — GLUCOSE, CAPILLARY
GLUCOSE-CAPILLARY: 119 mg/dL — AB (ref 65–99)
GLUCOSE-CAPILLARY: 112 mg/dL — AB (ref 65–99)

## 2016-02-13 SURGERY — AMPUTATION, FOOT, PARTIAL
Anesthesia: Monitor Anesthesia Care | Site: Foot | Laterality: Right

## 2016-02-13 MED ORDER — FENTANYL CITRATE (PF) 100 MCG/2ML IJ SOLN
INTRAMUSCULAR | Status: DC | PRN
  Administered 2016-02-13: 50 ug via INTRAVENOUS

## 2016-02-13 MED ORDER — MIDAZOLAM HCL 2 MG/2ML IJ SOLN
INTRAMUSCULAR | Status: AC
  Filled 2016-02-13: qty 2

## 2016-02-13 MED ORDER — BUPIVACAINE HCL (PF) 0.25 % IJ SOLN
INTRAMUSCULAR | Status: AC
  Filled 2016-02-13: qty 30

## 2016-02-13 MED ORDER — CLINDAMYCIN PHOSPHATE 900 MG/50ML IV SOLN
900.0000 mg | INTRAVENOUS | Status: AC
  Administered 2016-02-13: 900 mg via INTRAVENOUS
  Filled 2016-02-13: qty 50

## 2016-02-13 MED ORDER — ONDANSETRON HCL 4 MG/2ML IJ SOLN
INTRAMUSCULAR | Status: DC | PRN
  Administered 2016-02-13: 4 mg via INTRAVENOUS

## 2016-02-13 MED ORDER — HYDROMORPHONE HCL 1 MG/ML IJ SOLN
1.0000 mg | Freq: Once | INTRAMUSCULAR | Status: AC
  Administered 2016-02-13: 1 mg via INTRAVENOUS
  Filled 2016-02-13: qty 1

## 2016-02-13 MED ORDER — HYDROCODONE-ACETAMINOPHEN 5-325 MG PO TABS: 1.0000 | ORAL_TABLET | Freq: Four times a day (QID) | ORAL | 0 refills | Status: DC | PRN

## 2016-02-13 MED ORDER — SCOPOLAMINE 1 MG/3DAYS TD PT72SCOPOLAMINE 1 MG/3DAYS
MEDICATED_PATCH | TRANSDERMAL | Status: DC | PRN
Start: 2016-02-13 — End: 2016-02-13
  Administered 2016-02-13: 1 via TRANSDERMAL

## 2016-02-13 MED ORDER — TRAMADOL HCL 50 MG PO TABS: 50.0000 mg | ORAL_TABLET | Freq: Four times a day (QID) | ORAL | 0 refills | Status: DC | PRN

## 2016-02-13 MED ORDER — LIDOCAINE 2% (20 MG/ML) 5 ML SYRINGE
INTRAMUSCULAR | Status: DC | PRN
  Administered 2016-02-13: 60 mg via INTRAVENOUS

## 2016-02-13 MED ORDER — PROPOFOL 10 MG/ML IV BOLUS
INTRAVENOUS | Status: AC
  Filled 2016-02-13: qty 20

## 2016-02-13 MED ORDER — MIDAZOLAM HCL 2 MG/2ML IJ SOLN
INTRAMUSCULAR | Status: AC
  Administered 2016-02-13: 1 mg via INTRAVENOUS
  Filled 2016-02-13: qty 2

## 2016-02-13 MED ORDER — FENTANYL CITRATE (PF) 100 MCG/2ML IJ SOLN
50.0000 ug | Freq: Once | INTRAMUSCULAR | Status: AC
  Administered 2016-02-13: 50 ug via INTRAVENOUS

## 2016-02-13 MED ORDER — FENTANYL CITRATE (PF) 100 MCG/2ML IJ SOLN
INTRAMUSCULAR | Status: AC
  Administered 2016-02-13: 50 ug via INTRAVENOUS
  Filled 2016-02-13: qty 2

## 2016-02-13 MED ORDER — ONDANSETRON HCL 4 MG/2ML IJ SOLN: 4.0000 mg | Freq: Once | INTRAMUSCULAR | Status: DC | PRN

## 2016-02-13 MED ORDER — BUPIVACAINE HCL (PF) 0.25 % IJ SOLN
INTRAMUSCULAR | Status: DC | PRN
  Administered 2016-02-13: 10 mL

## 2016-02-13 MED ORDER — 0.9 % SODIUM CHLORIDE (POUR BTL) OPTIME
TOPICAL | Status: DC | PRN
  Administered 2016-02-13: 1000 mL

## 2016-02-13 MED ORDER — METOPROLOL SUCCINATE ER 25 MG PO TB24
25.0000 mg | ORAL_TABLET | Freq: Once | ORAL | Status: AC
  Administered 2016-02-13: 25 mg via ORAL
  Filled 2016-02-13: qty 1

## 2016-02-13 MED ORDER — FENTANYL CITRATE (PF) 100 MCG/2ML IJ SOLN
INTRAMUSCULAR | Status: AC
  Filled 2016-02-13: qty 2

## 2016-02-13 MED ORDER — METOPROLOL SUCCINATE ER 25 MG PO TB24
ORAL_TABLET | ORAL | Status: AC
  Administered 2016-02-13: 25 mg via ORAL
  Filled 2016-02-13: qty 1

## 2016-02-13 MED ORDER — PROPOFOL 10 MG/ML IV BOLUS
INTRAVENOUS | Status: DC | PRN
  Administered 2016-02-13 (×3): 20 mg via INTRAVENOUS

## 2016-02-13 MED ORDER — ONDANSETRON HCL 4 MG/2ML IJ SOLN
INTRAMUSCULAR | Status: AC
  Filled 2016-02-13: qty 2

## 2016-02-13 MED ORDER — CHLORHEXIDINE GLUCONATE 4 % EX LIQD: 60.0000 mL | Freq: Once | CUTANEOUS | Status: DC

## 2016-02-13 MED ORDER — LIDOCAINE 2% (20 MG/ML) 5 ML SYRINGE
INTRAMUSCULAR | Status: AC
  Filled 2016-02-13: qty 5

## 2016-02-13 MED ORDER — LACTATED RINGERS IV SOLN
INTRAVENOUS | Status: DC
  Administered 2016-02-13 (×2): via INTRAVENOUS

## 2016-02-13 MED ORDER — MIDAZOLAM HCL 2 MG/2ML IJ SOLN
1.0000 mg | Freq: Once | INTRAMUSCULAR | Status: AC
  Administered 2016-02-13: 1 mg via INTRAVENOUS

## 2016-02-13 MED ORDER — FENTANYL CITRATE (PF) 100 MCG/2ML IJ SOLN: 25.0000 ug | INTRAMUSCULAR | Status: DC | PRN

## 2016-02-13 SURGICAL SUPPLY — 37 items
BENZOIN TINCTURE PRP APPL 2/3 (GAUZE/BANDAGES/DRESSINGS) ×3 IMPLANT
BLADE SAW SGTL HD 18.5X60.5X1. (BLADE) ×3 IMPLANT
BLADE SURG 10 STRL SS (BLADE) ×3 IMPLANT
BNDG COHESIVE 4X5 TAN STRL (GAUZE/BANDAGES/DRESSINGS) ×3 IMPLANT
BNDG GAUZE ELAST 4 BULKY (GAUZE/BANDAGES/DRESSINGS) IMPLANT
CHLORAPREP W/TINT 26ML (MISCELLANEOUS) ×3 IMPLANT
COVER SURGICAL LIGHT HANDLE (MISCELLANEOUS) ×3 IMPLANT
DRAPE INCISE IOBAN 66X45 STRL (DRAPES) ×3 IMPLANT
DRAPE U-SHAPE 47X51 STRL (DRAPES) ×3 IMPLANT
DRSG ADAPTIC 3X8 NADH LF (GAUZE/BANDAGES/DRESSINGS) ×3 IMPLANT
DRSG PAD ABDOMINAL 8X10 ST (GAUZE/BANDAGES/DRESSINGS) ×3 IMPLANT
DURAPREP 26ML APPLICATOR (WOUND CARE) ×3 IMPLANT
ELECT REM PT RETURN 9FT ADLT (ELECTROSURGICAL) ×3
ELECTRODE REM PT RTRN 9FT ADLT (ELECTROSURGICAL) ×1 IMPLANT
GAUZE SPONGE 4X4 12PLY STRL (GAUZE/BANDAGES/DRESSINGS) ×3 IMPLANT
GLOVE BIOGEL PI IND STRL 9 (GLOVE) ×1 IMPLANT
GLOVE BIOGEL PI INDICATOR 9 (GLOVE) ×2
GLOVE SURG ORTHO 9.0 STRL STRW (GLOVE) ×3 IMPLANT
GOWN STRL REUS W/ TWL XL LVL3 (GOWN DISPOSABLE) ×1 IMPLANT
GOWN STRL REUS W/TWL XL LVL3 (GOWN DISPOSABLE) ×2
KIT BASIN OR (CUSTOM PROCEDURE TRAY) ×3 IMPLANT
KIT PREVENA INCISION MGT 13 (CANNISTER) ×3 IMPLANT
KIT ROOM TURNOVER OR (KITS) ×3 IMPLANT
NEEDLE HYPO 25GX1X1/2 BEV (NEEDLE) ×3 IMPLANT
NS IRRIG 1000ML POUR BTL (IV SOLUTION) ×3 IMPLANT
PACK ORTHO EXTREMITY (CUSTOM PROCEDURE TRAY) ×3 IMPLANT
PAD ARMBOARD 7.5X6 YLW CONV (MISCELLANEOUS) ×6 IMPLANT
SPONGE LAP 18X18 X RAY DECT (DISPOSABLE) ×3 IMPLANT
SUT ETHILON 2 0 PSLX (SUTURE) ×9 IMPLANT
SUT VIC AB 2-0 CTB1 (SUTURE) IMPLANT
SYR CONTROL 10ML LL (SYRINGE) ×3 IMPLANT
TOWEL OR 17X24 6PK STRL BLUE (TOWEL DISPOSABLE) ×3 IMPLANT
TOWEL OR 17X26 10 PK STRL BLUE (TOWEL DISPOSABLE) IMPLANT
TUBE CONNECTING 12'X1/4 (SUCTIONS) ×1
TUBE CONNECTING 12X1/4 (SUCTIONS) ×2 IMPLANT
WATER STERILE IRR 1000ML POUR (IV SOLUTION) IMPLANT
YANKAUER SUCT BULB TIP NO VENT (SUCTIONS) ×3 IMPLANT

## 2016-02-13 NOTE — Transfer of Care (Signed)
Immediate Anesthesia Transfer of Care Note  Patient: Judith [name]  Procedure(s) Performed: Procedure(s): Right Transmetatarsal Amputation (Right)  Patient Location: PACU  Anesthesia Type:MAC combined with regional for post-op pain  Level of Consciousness: awake, alert , oriented and patient cooperative  Airway & Oxygen Therapy: Patient Spontanous Breathing and Patient connected to nasal cannula oxygen  Post-op Assessment: Report given to RN, Post -op Vital signs reviewed and stable and Patient moving all extremities  Post vital signs: Reviewed and stable  Last Vitals:  Vitals:   02/13/16 0842 02/13/16 1004  BP: (!) 171/71   Pulse: 68   Resp: 12   Temp:  (P) 36.8 C    Last Pain:  Vitals:   02/13/16 1004  TempSrc:   PainSc: (P) 0-No pain         Complications: No apparent anesthesia complications

## 2016-02-13 NOTE — ED Notes (Signed)
Dressing changed on pt's right foot. Wet to dry dressing applied, ace wrap to secure dressing. Pt tolerated well.

## 2016-02-13 NOTE — Anesthesia Preprocedure Evaluation (Signed)
Anesthesia Evaluation  Patient identified by MRN, date of birth, ID band Patient awake    Reviewed: Allergy & Precautions, NPO status , Patient's Chart, lab work & pertinent test results  Airway Mallampati: II  TM Distance: >3 FB Neck ROM: Full    Dental   Pulmonary former smoker,    breath sounds clear to auscultation       Cardiovascular hypertension,  Rhythm:Regular Rate:Normal     Neuro/Psych    GI/Hepatic   Endo/Other  diabetes  Renal/GU      Musculoskeletal   Abdominal   Peds  Hematology   Anesthesia Other Findings   Reproductive/Obstetrics                             Anesthesia Physical Anesthesia Plan  ASA: III  Anesthesia Plan: MAC and Regional   Post-op Pain Management:    Induction: Intravenous  Airway Management Planned: Natural Airway and Nasal Cannula  Additional Equipment:   Intra-op Plan:   Post-operative Plan:   Informed Consent: I have reviewed the patients History and Physical, chart, labs and discussed the procedure including the risks, benefits and alternatives for the proposed anesthesia with the patient or authorized representative who has indicated his/her understanding and acceptance.     Plan Discussed with: CRNA and Anesthesiologist  Anesthesia Plan Comments:         Anesthesia Quick Evaluation

## 2016-02-13 NOTE — Anesthesia Procedure Notes (Signed)
Anesthesia Regional Block:  Ankle block  Pre-Anesthetic Checklist: ,, timeout performed, Correct Patient, Correct Site, Correct Laterality, Correct Procedure, Correct Position, site marked, Risks and benefits discussed,  Surgical consent,  Pre-op evaluation,  At surgeon's request and post-op pain management  Laterality: Right  Prep: chloraprep       Needles:  Injection technique: Single-shot     Needle Length: 9cm 9 cm Needle Gauge: 22 and 22 G    Additional Needles: Ankle block Narrative:  Start time: 02/13/2016 7:25 AM End time: 02/13/2016 7:30 AM Injection made incrementally with aspirations every 5 mL.  Performed by: Personally   Additional Notes: 30 cc 2% lidocaine plain injected easily

## 2016-02-13 NOTE — ED Triage Notes (Signed)
Pt brought in from home by EMS with c/o active bleeding from surgical wound to right foot. Pt received surgery this morning on her right foot where she had osteomyelitis and they removed four of her remaining toes and part of her right foot. Pt was d/c home following surgery with a wound vac in place. Pt reports excessive bleeding around wound vac once she got home, per pt "surgeon told me to removed the wound vac and place a gauze dressing in place". Pt presents with dry gauze and kerlex dressing in place. Part of wound vac sponge still in place, no active bleeding at this time. Pt received 100 mcg of Fentanyl IV PTA by EMS.

## 2016-02-13 NOTE — Op Note (Signed)
     Date of Surgery: 02/13/2016  INDICATIONS: Judith [name] is a 65 y.o.-year-old female who presents with dehiscence third Ray amputation right foot.  PREOPERATIVE DIAGNOSIS: Diabetic insensate neuropathy with peripheral vascular disease with dehiscence third Ray amputation right foot  POSTOPERATIVE DIAGNOSIS: Same.  PROCEDURE: Transmetatarsal amputation Application of Prevena wound VAC  SURGEON: Sharol Given, M.D.  ANESTHESIA:  general  IV FLUIDS AND URINE: See anesthesia.  ESTIMATED BLOOD LOSS: Minimal mL.  COMPLICATIONS: None.  DESCRIPTION OF PROCEDURE: The patient was brought to the operating room and underwent a general anesthetic. After adequate levels of anesthesia were obtained patient's lower extremity was prepped using DuraPrep draped into a sterile field. A timeout was called.  A fishmouth incision was made just proximal to the ulcerative nonviable tissue. This was carried sharply down to bone. A oscillating saw was used to perform a transmetatarsal amputation with a gentle cascade of the metatarsals and beveled plantarly. Electrocautery was used for hemostasis. The wound was irrigated with normal saline. The incision was closed using 2-0 nylon. A Prevena wound VAC was applied. This had a good suction fit. Patient was taken to the PACU in stable condition.  [name], MD [place] 9:51 AM

## 2016-02-13 NOTE — ED Notes (Addendum)
Pt d/c home with family.

## 2016-02-13 NOTE — Discharge Instructions (Signed)
Return here as needed.  Follow-up Monday with Dr. Sharol Given. elevate the area

## 2016-02-13 NOTE — Anesthesia Postprocedure Evaluation (Signed)
Anesthesia Post Note  Patient: Judith [name]  Procedure(s) Performed: Procedure(s) (LRB): Right Transmetatarsal Amputation (Right)  Patient location during evaluation: PACU Anesthesia Type: MAC Level of consciousness: awake, awake and alert and oriented Pain management: pain level controlled Vital Signs Assessment: post-procedure vital signs reviewed and stable Respiratory status: spontaneous breathing, nonlabored ventilation and respiratory function stable Cardiovascular status: blood pressure returned to baseline Anesthetic complications: no    Last Vitals:  Vitals:   02/13/16 1031 02/13/16 1032  BP: (!) 142/76   Pulse:  69  Resp:    Temp:      Last Pain:  Vitals:   02/13/16 1004  TempSrc:   PainSc: 0-No pain                 Reghan Thul COKER

## 2016-02-13 NOTE — H&P (Signed)
Judith [name] is an 65 y.o. female.   Chief Complaint: Dehiscence right foot third Ray amputation HPI: Patient is a 65 year old woman with diabetic insensate neuropathy peripheral vascular disease who is status post a third Ray amputation presents at this time with dehiscence of the amputation.  Past Medical History:  Diagnosis Date  . Abnormal EKG 07/31/2013  . Arthritis    "hands" (03/06/2015)  . Asthma   . Carpal tunnel syndrome, bilateral   . Carpal tunnel syndrome, bilateral   . Chronic kidney disease   . Complication of anesthesia    slow to wake up  . Coronary artery disease     2 v CAD with CTO of the RCA and high grade bifurcational LCx/OM stenosis. S/P PCI DES x 2 to the LCx/OM.  Marland Kitchen Diabetic peripheral neuropathy ([place]) "since 1996"  . GERD (gastroesophageal reflux disease)   . Goiter   . Headache    migraines prior to menopause  . History of shingles 06/01/2013  . Hyperlipidemia   . Hyperlipidemia LDL goal <70 10/13/2015  . Hypertension   . Hyperthyroidism   . PAF (paroxysmal atrial fibrillation) ([place]) 04/29/2015   CHADS2VASC score of 4 now on Apixaban  . Paroxysmal atrial fibrillation with rapid ventricular response ([place]) 11/15/2015  . Pneumonia ~ 1976  . Tremors of nervous system   . Type II diabetes mellitus (HCC)    insulin dependent    Past Surgical History:  Procedure Laterality Date  . ABDOMINAL HYSTERECTOMY  1988   age 65; CERVICAL DYSPLASIA; ovaries intact.   . AMPUTATION Right 01/23/2016   Procedure: Right 3rd Ray Amputation;  Surgeon: [name], MD;  Location: [facility];  Service: Orthopedics;  Laterality: Right;  . CARDIAC CATHETERIZATION N/A 02/27/2015   Procedure: Left Heart Cath and Coronary Angiography;  Surgeon: [name], MD; LAD 40%, mCFX 80%, OM 70%, RCA 100% calcified       . CARDIAC CATHETERIZATION N/A 03/06/2015   Procedure: Coronary Stent Intervention;  Surgeon: [name], MD;  Location: [facility];  Service: Cardiovascular;   Laterality: N/A;  Mid CX 3.50x12 promus DES w/ 0% resdual and Prox OM1 2.50x20 promus DES w/ 20% residual  . CARPAL TUNNEL RELEASE Right Nov 2015  . CARPAL TUNNEL RELEASE Right 1992; 05/2014   [name]; [place]  . CESAREAN SECTION  1982; 1984  . FOOT NEUROMA SURGERY Bilateral 2000  . KNEE ARTHROSCOPY Right ~ 2003   "meniscus repair"  . SHOULDER OPEN ROTATOR CUFF REPAIR Right 1996; 1998   "w/fracture repair"  . THYROID SURGERY  2000   "removed lots of nodules"  . TONSILLECTOMY  1976    Family History  Problem Relation Age of Onset  . Cancer Mother 25    bronchial cancer  . Breast cancer Mother   . Lung cancer Mother   . Hypertension Father   . COPD Father   . Heart disease Father 78    CAD with cardiac stenting  . Heart attack Father   . Parkinson's disease Father   . Allergies Sister   . Breast cancer Maternal Grandmother   . Emphysema Maternal Grandfather   . Leukemia Paternal Grandmother   . Emphysema Paternal Grandfather   . Thyroid disease Neg Hx    Social History:  reports that she quit smoking about a year ago. Her smoking use included Cigarettes. She smoked 0.00 packs per day for 41.00 years. She has never used smokeless tobacco. She reports that she does not drink alcohol or  use drugs.  Allergies:  Allergies  Allergen Reactions  . Novolog [Insulin Aspart] Shortness Of Breath    "breathing problems"  . Codeine Nausea And Vomiting    HIGH DOSES-SEVERE VOMITING  . Iodine Other (See Comments)    MUST HAVE BENADRYL PRIOR TO PROCEDURE AND RIGHT BEFORE TREATMENT TO COUNTERACT REACTION-BLISTERING REACTION DERMATOLOGICAL  . Penicillins Itching, Rash and Other (See Comments)    Has patient had a PCN reaction causing immediate rash, facial/tongue/throat swelling, SOB or lightheadedness with hypotension: Yes Has patient had a PCN reaction causing severe rash involving mucus membranes or skin necrosis: No Has patient had a PCN reaction that required hospitalization No Has  patient had a PCN reaction occurring within the last 10 years: No If all of the above answers are "NO", then may proceed with Cephalosporin use.  CHEST SIZED RASH AND ITCHING   . Propofol Other (See Comments)    "Breathing problems - asthma attack"  . Ace Inhibitors Cough  . Demerol [Meperidine] Nausea And Vomiting  . Neosporin [Neomycin-Bacitracin Zn-Polymyx] Itching and Rash    MAKES REACTIONS WORSE WHEN USING AS PROPHYLACTIC  . Percocet [Oxycodone-Acetaminophen] Rash  . Tape Itching and Rash    No prescriptions prior to admission.    No results found for this or any previous visit (from the past 48 hour(s)). No results found.  Review of Systems  All other systems reviewed and are negative.   There were no vitals taken for this visit. Physical Exam  On examination patient is alert oriented no adenopathy well-dressed normal affect normal respiratory effort she has an antalgic gait patient does have a palpable dorsalis pedis pulse the third ray amputation is completely dehisced with exposed bone. Assessment/Plan Assessment: Diabetic insensate neuropathy peripheral vascular disease with dehiscence third Ray amputation right foot.  Plan: We'll plan for transmetatarsal amputation. Risk and benefits were discussed including infection neurovascular injury persistent pain nonhealing the wound need for additional surgery. Patient states she understands wish to proceed at this time.  [name], MD 02/13/2016, 6:31 AM

## 2016-02-13 NOTE — ED Provider Notes (Signed)
[facility] DEPT Provider Note   CSN: [id number] Arrival date & time: 02/13/16  1617     History   Chief Complaint Chief Complaint  Patient presents with  . Wound Check    HPI Judith [name] is a 65 y.o. female.  HPI Patient presents to the emergency department with postoperative location the patient had the distal third of her foot amputated and a wound VAC placed.  The patient states the wound VAC filled up quickly and they called their orthopedic doctor's office who advised them to remove the wound VAC.  They started to remove it and noted a lot of bleeding from the wound back area.  The patient's she is also having pain associated with this. The patient denies chest pain, shortness of breath, headache,blurred vision, neck pain, fever, cough, weakness, numbness, dizziness, anorexia, edema, abdominal pain, nausea, vomiting, diarrhea, rash, back pain, dysuria, hematemesis, bloody stool, near syncope, or syncope. Past Medical History:  Diagnosis Date  . Abnormal EKG 07/31/2013  . Arthritis    "hands" (03/06/2015)  . Asthma   . Carpal tunnel syndrome, bilateral   . Carpal tunnel syndrome, bilateral   . Chronic kidney disease   . Complication of anesthesia    slow to wake up  . Coronary artery disease     2 v CAD with CTO of the RCA and high grade bifurcational LCx/OM stenosis. S/P PCI DES x 2 to the LCx/OM.  Marland Kitchen Diabetic peripheral neuropathy ([place]) "since 1996"  . GERD (gastroesophageal reflux disease)   . Goiter   . Headache    migraines prior to menopause  . History of shingles 06/01/2013  . Hyperlipidemia   . Hyperlipidemia LDL goal <70 10/13/2015  . Hypertension   . Hyperthyroidism   . PAF (paroxysmal atrial fibrillation) ([place]) 04/29/2015   CHADS2VASC score of 4 now on Apixaban  . Paroxysmal atrial fibrillation with rapid ventricular response ([place]) 11/15/2015  . Pneumonia ~ 1976  . Tremors of nervous system   . Type II diabetes mellitus (HCC)    insulin dependent     Patient Active Problem List   Diagnosis Date Noted  . Obstructive sleep apnea 11/26/2015  . Bilateral carpal tunnel syndrome 11/26/2015  . Hypomagnesemia 11/16/2015  . Hyperlipidemia LDL goal <70 10/13/2015  . Abnormality of gait 09/02/2015  . Memory loss 08/12/2015  . Diabetic peripheral neuropathy ([place]) 08/12/2015  . Vitamin D deficiency 08/12/2015  . Hyperthyroidism 04/30/2015  . Heme positive stool   . PAF (paroxysmal atrial fibrillation) ([place]) 04/29/2015  . Coronary artery disease involving native coronary artery of native heart without angina pectoris 03/29/2015  . Groin hematoma 11/02/2014  . Exertional angina ([place]) 03/06/2015  . Abnormal nuclear stress test   . Acute kidney injury ([place]) 02/12/2015  . Acute hyponatremia 02/12/2015  . History of goiter 09/28/2014  . GERD (gastroesophageal reflux disease) 07/31/2013  . Depression with anxiety 06/01/2013  . Diabetes ([place]) 01/30/2013  . HTN (hypertension) 01/30/2013  . Diabetic neuropathy, painful ([place]) 01/30/2013  . Tobacco user 01/30/2013    Past Surgical History:  Procedure Laterality Date  . ABDOMINAL HYSTERECTOMY  1988   age 37; CERVICAL DYSPLASIA; ovaries intact.   . AMPUTATION Right 01/23/2016   Procedure: Right 3rd Ray Amputation;  Surgeon: [name], MD;  Location: [facility];  Service: Orthopedics;  Laterality: Right;  . CARDIAC CATHETERIZATION N/A 02/27/2015   Procedure: Left Heart Cath and Coronary Angiography;  Surgeon: [name], MD; LAD 40%, mCFX 80%, OM 70%, RCA 100% calcified       .  CARDIAC CATHETERIZATION N/A 03/06/2015   Procedure: Coronary Stent Intervention;  Surgeon: [name], MD;  Location: [facility];  Service: Cardiovascular;  Laterality: N/A;  Mid CX 3.50x12 promus DES w/ 0% resdual and Prox OM1 2.50x20 promus DES w/ 20% residual  . CARPAL TUNNEL RELEASE Right Nov 2015  . CARPAL TUNNEL RELEASE Right 1992; 05/2014   [name]; [place]  . CESAREAN SECTION  1982; 1984  . FOOT  NEUROMA SURGERY Bilateral 2000  . KNEE ARTHROSCOPY Right ~ 2003   "meniscus repair"  . SHOULDER OPEN ROTATOR CUFF REPAIR Right 1996; 1998   "w/fracture repair"  . THYROID SURGERY  2000   "removed lots of nodules"  . TONSILLECTOMY  1976    OB History    No data available       Home Medications    Prior to Admission medications   Medication Sig Start Date End Date Taking? Authorizing Provider  albuterol (PROVENTIL HFA) 108 (90 Base) MCG/ACT inhaler Inhale 1-2 puffs into the lungs every 6 (six) hours as needed for wheezing or shortness of breath. 11/07/15  Yes [name], MD  amLODipine (NORVASC) 5 MG tablet Take 1 tablet (5 mg total) by mouth daily. 11/18/15  Yes Rhonda G Barrett, PA-C  apixaban (ELIQUIS) 5 MG TABS tablet Take 1 tablet (5 mg total) by mouth 2 (two) times daily. 05/01/15  Yes Bhavinkumar Bhagat, PA  atorvastatin (LIPITOR) 20 MG tablet Take 1 tablet (20 mg total) by mouth daily. 04/22/15  Yes Scott T Kathlen Mody, PA-C  buPROPion (WELLBUTRIN SR) 150 MG 12 hr tablet Take 1 tablet (150 mg total) by mouth 2 (two) times daily. 02/05/16  Yes [name], MD  clindamycin (CLEOCIN) 300 MG capsule Take 300 mg by mouth See admin instructions. Take 1 capsule (300 mg) one hour prior to dental appointment - last taken 02/05/16   Yes Historical Provider, MD  clopidogrel (PLAVIX) 75 MG tablet Take 1 tablet (75 mg total) by mouth daily. 02/27/15  Yes [name], MD  cyclobenzaprine (FLEXERIL) 5 MG tablet Take 5 mg by mouth at bedtime as needed for muscle spasms. 12/04/15  Yes Historical Provider, MD  doxycycline (VIBRAMYCIN) 100 MG capsule Take 100 mg by mouth 2 (two) times daily. 30 day course filled 01/30/16 01/30/16  Yes Historical Provider, MD  FLUoxetine (PROZAC) 20 MG capsule Take 1 capsule (20 mg total) by mouth at bedtime. 02/05/16  Yes [name], MD  fluticasone (FLOVENT HFA) 44 MCG/ACT inhaler Inhale 2 puffs into the lungs 2 (two) times daily as needed (shortness of breath/  wheezing).    Yes Historical Provider, MD  gabapentin (NEURONTIN) 300 MG capsule TAKE 3 CAPSULES BY MOUTH AT morning AND 3 CAPSULES AT BEDTIME Patient taking differently: Take 900 mg by mouth 2 (two) times daily. TAKE 3 CAPSULES BY MOUTH EVERY  MORNING AND 3 CAPSULES AT BEDTIME 02/05/16  Yes [name], MD  Insulin Glargine [facility] KWIKPEN) 100 UNIT/ML SOPN Inject 0.2 mLs (20 Units total) into the skin daily at 10 pm. 01/15/16  Yes Renato Shin, MD  insulin lispro (HUMALOG) 100 UNIT/ML injection 3 times a day (just before each meal) 20-20-10 units Patient taking differently: Inject 10-20 Units into the skin See admin instructions. Inject  20 units subuctaneously before breakfast and before lunch, inject 10 units at bedtime 01/15/16  Yes Renato Shin, MD  loperamide (IMODIUM A-D) 2 MG tablet Take 2 mg by mouth 4 (four) times daily as needed for diarrhea or loose  stools.   Yes Historical Provider, MD  loratadine (CLARITIN) 10 MG tablet Take 10 mg by mouth daily as needed for allergies.   Yes Historical Provider, MD  losartan (COZAAR) 50 MG tablet Take 1 tablet (50 mg total) by mouth daily. 03/13/15  Yes Liliane Shi, PA-C  metFORMIN (GLUCOPHAGE) 1000 MG tablet Take 1 tablet (1,000 mg total) by mouth 2 (two) times daily with a meal. 10/23/15  Yes Renato Shin, MD  methimazole (TAPAZOLE) 10 MG tablet Take 1 tablet (10 mg total) by mouth daily. 01/15/16  Yes Renato Shin, MD  metoprolol succinate (TOPROL-XL) 25 MG 24 hr tablet Take 1 tablet (25 mg total) by mouth 2 (two) times daily. Take with or immediately following a meal. 11/18/15  Yes Rhonda G Barrett, PA-C  nitroGLYCERIN (NITRODUR - DOSED IN MG/24 HR) 0.2 mg/hr patch Place 0.1 mg onto the skin daily. Patch burns patient so she cuts it in half 01/30/16  Yes Historical Provider, MD  nitroGLYCERIN (NITROSTAT) 0.4 MG SL tablet Place 1 tablet (0.4 mg total) under the tongue every 5 (five) minutes as needed for chest pain. 02/25/15  Yes Scott T Kathlen Mody, PA-C    ondansetron (ZOFRAN) 4 MG tablet Take 1 tablet (4 mg total) by mouth every 8 (eight) hours as needed for nausea or vomiting. 04/13/15  Yes [name], PA-C  pantoprazole (PROTONIX) 40 MG tablet Take 1 tablet (40 mg total) by mouth daily. 02/05/16  Yes [name], MD  sotalol (BETAPACE) 80 MG tablet Take 1 tablet (80 mg total) by mouth every 12 (twelve) hours. 11/18/15  Yes Rhonda G Barrett, PA-C  traMADol (ULTRAM) 50 MG tablet Take 1 tablet (50 mg total) by mouth every 6 (six) hours as needed. Maximum dose= 8 tablets per day Patient taking differently: Take 50 mg by mouth every 6 (six) hours as needed (pain). Maximum dose= 8 tablets per day 02/13/16  Yes [name], MD  traMADol (ULTRAM) 50 MG tablet TAKE ONE TO TWO TABLETS BY MOUTH AT BEDTIME AS NEEDED NEUROPATHY Patient not taking: Reported on 02/13/2016 02/05/16   [name], MD    Family History Family History  Problem Relation Age of Onset  . Cancer Mother 69    bronchial cancer  . Breast cancer Mother   . Lung cancer Mother   . Hypertension Father   . COPD Father   . Heart disease Father 37    CAD with cardiac stenting  . Heart attack Father   . Parkinson's disease Father   . Allergies Sister   . Breast cancer Maternal Grandmother   . Emphysema Maternal Grandfather   . Leukemia Paternal Grandmother   . Emphysema Paternal Grandfather   . Thyroid disease Neg Hx     Social History Social History  Substance Use Topics  . Smoking status: Former Smoker    Packs/day: 0.00    Years: 41.00    Types: Cigarettes    Quit date: 03/05/2015  . Smokeless tobacco: Never Used     Comment: 04/29/2015 "quit smoking cigarettes 02/27/2015"  . Alcohol use No     Allergies   Novolog [insulin aspart]; Codeine; Iodine; Penicillins; Propofol; Ace inhibitors; Demerol [meperidine]; Neosporin [neomycin-bacitracin zn-polymyx]; Percocet [oxycodone-acetaminophen]; and Tape   Review of Systems Review of Systems All other systems  negative except as documented in the HPI. All pertinent positives and negatives as reviewed in the HPI.  Physical Exam Updated Vital Signs BP 156/89   Pulse 72   Temp 99.1  F (37.3 C) (Oral)   Resp 13   SpO2 (!) 89%   Physical Exam  Constitutional: She is oriented to person, place, and time. She appears well-developed and well-nourished. No distress.  HENT:  Head: Normocephalic and atraumatic.  Mouth/Throat: Oropharynx is clear and moist.  Eyes: Pupils are equal, round, and reactive to light.  Neck: Normal range of motion. Neck supple.  Cardiovascular: Normal rate, regular rhythm and normal heart sounds.  Exam reveals no gallop and no friction rub.   No murmur heard. Pulmonary/Chest: Effort normal and breath sounds normal. No respiratory distress. She has no wheezes.  Abdominal: Soft. Bowel sounds are normal. She exhibits no distension. There is no tenderness.  Musculoskeletal:       Feet:  Neurological: She is alert and oriented to person, place, and time. She exhibits normal muscle tone. Coordination normal.  Skin: Skin is warm and dry. No rash noted. No erythema.  Psychiatric: She has a normal mood and affect. Her behavior is normal.  Nursing note and vitals reviewed.    ED Treatments / Results  Labs (all labs ordered are listed, but only abnormal results are displayed) Labs Reviewed - No data to display  EKG  EKG Interpretation None       Radiology No results found.  Procedures Procedures (including critical care time)  Medications Ordered in ED Medications  HYDROmorphone (DILAUDID) injection 1 mg (1 mg Intravenous Given 02/13/16 1744)     Initial Impression / Assessment and Plan / ED Course  I have reviewed the triage vital signs and the nursing notes.  Pertinent labs & imaging results that were available during my care of the patient were reviewed by me and considered in my medical decision making (see chart for details).  Clinical Course    Spoke  with Dr. Sharol Given who removed the patient's distal third of her foot.  He advised to remove the wound VAC sponge and do a wet-to-dry dressing with a bulky wrap with Ace bandage.  Advised patient follow up with Dr. Sharol Given first thing on Monday and return here as needed  Final Clinical Impressions(s) / ED Diagnoses   Final diagnoses:  None    New Prescriptions New Prescriptions   No medications on file     Dalia Heading, PA-C 02/13/16 1903    Charlesetta Shanks, MD 02/14/16 1747

## 2016-02-16 ENCOUNTER — Encounter (HOSPITAL_COMMUNITY): Payer: Self-pay | Admitting: Orthopedic Surgery

## 2016-02-18 ENCOUNTER — Inpatient Hospital Stay (INDEPENDENT_AMBULATORY_CARE_PROVIDER_SITE_OTHER): Payer: Managed Care, Other (non HMO) | Admitting: Orthopedic Surgery

## 2016-02-18 DIAGNOSIS — I87331 Chronic venous hypertension (idiopathic) with ulcer and inflammation of right lower extremity: Secondary | ICD-10-CM

## 2016-02-18 DIAGNOSIS — E1142 Type 2 diabetes mellitus with diabetic polyneuropathy: Secondary | ICD-10-CM

## 2016-02-25 ENCOUNTER — Inpatient Hospital Stay (INDEPENDENT_AMBULATORY_CARE_PROVIDER_SITE_OTHER): Payer: Managed Care, Other (non HMO) | Admitting: Orthopedic Surgery

## 2016-02-25 DIAGNOSIS — E1142 Type 2 diabetes mellitus with diabetic polyneuropathy: Secondary | ICD-10-CM

## 2016-02-25 DIAGNOSIS — Z89431 Acquired absence of right foot: Secondary | ICD-10-CM

## 2016-03-04 ENCOUNTER — Encounter (INDEPENDENT_AMBULATORY_CARE_PROVIDER_SITE_OTHER): Payer: Self-pay | Admitting: Family

## 2016-03-04 ENCOUNTER — Ambulatory Visit (INDEPENDENT_AMBULATORY_CARE_PROVIDER_SITE_OTHER): Payer: Managed Care, Other (non HMO) | Admitting: Family

## 2016-03-04 VITALS — Ht 64.0 in | Wt 180.0 lb

## 2016-03-04 DIAGNOSIS — Z89431 Acquired absence of right foot: Secondary | ICD-10-CM

## 2016-03-04 NOTE — Progress Notes (Signed)
Wound Care Note   Patient: Judith [name]           Date of Birth: 21-Dec-1950           MRN: [id number]             PCP: Reginia Forts, MD Visit Date: 03/04/2016   Assessment & Plan: Visit Diagnoses:  1. S/P transmetatarsal amputation of foot, right ([place])     Plan: Discussed physical therapy, patient declined at this time. Procedure week  Continue nonweightbearing. Continue daily wound care. Elevation for swelling. Follow up in 2 more weeks. Anticipate advancing weightbearing at that time.  Follow-Up Instructions: No Follow-up on file.  Orders:  No orders of the defined types were placed in this encounter.  No orders of the defined types were placed in this encounter.     Procedures: No notes on file   Clinical Data: No additional findings.   No images are attached to the encounter.   Subjective: Chief Complaint  Patient presents with  . Right Foot - Routine Post Op  . Routine Post Op    Right transmetatarsal amputation 02/13/16    Patient presents for two week follow up on right transmetatarsal amputation on 02/13/16. Patient is approximately 3 weeks post op. She is currently doing dial soap cleansing and applying medical compression stocking daily. She has very minimal bloody drainage lateral residual limb. She has slight redness, denies fever,chills or nausea. Theres is eschar present along incision. She is currently nonweightbearing with post operative shoe.     Review of Systems  Constitutional: Negative for chills and fever.    Miscellaneous:  -Home Health Care: No, her friend is assisting with care  -Physical Therapy: requests in home physical therapy for gait training due to balance loss, pending weightbearing status  -Out of Work?: yes   Objective: Vital Signs: There were no vitals taken for this visit.  Physical Exam: Moderate swelling. Incision is healing well. Sutures have been harvested. There is scattered eschar. No gaping, no drainage, slight  erythema. No cellulitis. No sign of infection.  Specialty Comments: No specialty comments available.   PMFS History: Patient Active Problem List   Diagnosis Date Noted  . Obstructive sleep apnea 11/26/2015  . Bilateral carpal tunnel syndrome 11/26/2015  . Hypomagnesemia 11/16/2015  . Hyperlipidemia LDL goal <70 10/13/2015  . Abnormality of gait 09/02/2015  . Memory loss 08/12/2015  . Diabetic peripheral neuropathy ([place]) 08/12/2015  . Vitamin D deficiency 08/12/2015  . Hyperthyroidism 04/30/2015  . Heme positive stool   . PAF (paroxysmal atrial fibrillation) ([place]) 04/29/2015  . Coronary artery disease involving native coronary artery of native heart without angina pectoris 03/29/2015  . Groin hematoma 05-05-2015  . Exertional angina ([place]) 03/06/2015  . Abnormal nuclear stress test   . Acute kidney injury ([place]) 02/12/2015  . Acute hyponatremia 02/12/2015  . History of goiter 09/28/2014  . GERD (gastroesophageal reflux disease) 07/31/2013  . Depression with anxiety 06/01/2013  . HTN (hypertension) 01/30/2013  . Diabetic neuropathy, painful ([place]) 01/30/2013  . Tobacco user 01/30/2013   Past Medical History:  Diagnosis Date  . Abnormal EKG 07/31/2013  . Arthritis    "hands" (03/06/2015)  . Asthma   . Carpal tunnel syndrome, bilateral   . Carpal tunnel syndrome, bilateral   . Chronic kidney disease   . Complication of anesthesia    slow to wake up  . Coronary artery disease     2 v CAD with CTO of the RCA and high grade  bifurcational LCx/OM stenosis. S/P PCI DES x 2 to the LCx/OM.  Marland Kitchen Diabetic peripheral neuropathy ([place]) "since 1996"  . GERD (gastroesophageal reflux disease)   . Goiter   . Headache    migraines prior to menopause  . History of shingles 06/01/2013  . Hyperlipidemia   . Hyperlipidemia LDL goal <70 10/13/2015  . Hypertension   . Hyperthyroidism   . PAF (paroxysmal atrial fibrillation) ([place]) 04/29/2015   CHADS2VASC score of 4 now on Apixaban  . Paroxysmal  atrial fibrillation with rapid ventricular response ([place]) 11/15/2015  . Pneumonia ~ 1976  . Tremors of nervous system   . Type II diabetes mellitus (HCC)    insulin dependent    Family History  Problem Relation Age of Onset  . Cancer Mother 34    bronchial cancer  . Breast cancer Mother   . Lung cancer Mother   . Hypertension Father   . COPD Father   . Heart disease Father 68    CAD with cardiac stenting  . Heart attack Father   . Parkinson's disease Father   . Allergies Sister   . Breast cancer Maternal Grandmother   . Emphysema Maternal Grandfather   . Leukemia Paternal Grandmother   . Emphysema Paternal Grandfather   . Thyroid disease Neg Hx    Past Surgical History:  Procedure Laterality Date  . ABDOMINAL HYSTERECTOMY  1988   age 38; CERVICAL DYSPLASIA; ovaries intact.   . AMPUTATION Right 01/23/2016   Procedure: Right 3rd Ray Amputation;  Surgeon: [name], MD;  Location: [facility];  Service: Orthopedics;  Laterality: Right;  . AMPUTATION Right 02/13/2016   Procedure: Right Transmetatarsal Amputation;  Surgeon: [name], MD;  Location: [facility];  Service: Orthopedics;  Laterality: Right;  . CARDIAC CATHETERIZATION N/A 02/27/2015   Procedure: Left Heart Cath and Coronary Angiography;  Surgeon: [name], MD; LAD 40%, mCFX 80%, OM 70%, RCA 100% calcified       . CARDIAC CATHETERIZATION N/A 03/06/2015   Procedure: Coronary Stent Intervention;  Surgeon: [name], MD;  Location: [facility];  Service: Cardiovascular;  Laterality: N/A;  Mid CX 3.50x12 promus DES w/ 0% resdual and Prox OM1 2.50x20 promus DES w/ 20% residual  . CARPAL TUNNEL RELEASE Right Nov 2015  . CARPAL TUNNEL RELEASE Right 1992; 05/2014   [name]; [place]  . CESAREAN SECTION  1982; 1984  . FOOT NEUROMA SURGERY Bilateral 2000  . KNEE ARTHROSCOPY Right ~ 2003   "meniscus repair"  . SHOULDER OPEN ROTATOR CUFF REPAIR Right 1996; 1998   "w/fracture repair"  . THYROID SURGERY  2000    "removed lots of nodules"  . TONSILLECTOMY  1976   Social History   Occupational History  . Food Therapist, nutritional    Social History Main Topics  . Smoking status: Former Smoker    Packs/day: 0.00    Years: 41.00    Types: Cigarettes    Quit date: 03/05/2015  . Smokeless tobacco: Never Used     Comment: 04/29/2015 "quit smoking cigarettes 02/27/2015"  . Alcohol use No  . Drug use: No  . Sexual activity: Not Currently    Birth control/ protection: Post-menopausal, Surgical

## 2016-03-09 ENCOUNTER — Ambulatory Visit: Payer: Managed Care, Other (non HMO) | Admitting: Family Medicine

## 2016-03-10 ENCOUNTER — Ambulatory Visit (INDEPENDENT_AMBULATORY_CARE_PROVIDER_SITE_OTHER): Payer: Managed Care, Other (non HMO) | Admitting: Orthopedic Surgery

## 2016-03-12 LAB — HM DIABETES EYE EXAM

## 2016-03-15 ENCOUNTER — Encounter: Payer: Self-pay | Admitting: Endocrinology

## 2016-03-16 ENCOUNTER — Encounter: Payer: Self-pay | Admitting: Endocrinology

## 2016-03-16 ENCOUNTER — Ambulatory Visit (INDEPENDENT_AMBULATORY_CARE_PROVIDER_SITE_OTHER): Payer: Managed Care, Other (non HMO) | Admitting: Endocrinology

## 2016-03-16 VITALS — BP 146/82 | HR 81 | Ht 64.0 in | Wt 180.0 lb

## 2016-03-16 DIAGNOSIS — E119 Type 2 diabetes mellitus without complications: Secondary | ICD-10-CM

## 2016-03-16 LAB — POCT GLYCOSYLATED HEMOGLOBIN (HGB A1C): HEMOGLOBIN A1C: 5.8

## 2016-03-16 MED ORDER — METFORMIN HCL ER 500 MG PO TB24: 2000.0000 mg | ORAL_TABLET | Freq: Every day | ORAL | 3 refills | Status: DC

## 2016-03-16 MED ORDER — INSULIN LISPRO 100 UNIT/ML ~~LOC~~ SOLN: SUBCUTANEOUS | 11 refills | Status: DC

## 2016-03-16 NOTE — Patient Instructions (Addendum)
Thyroid blood tests are requested for you today.  Please do anywhere you want.  We'll let you know about the results. if ever you have fever while taking methimazole, stop it and call us, even if the reason is obvious, because of the risk of a rare side-effect. check your blood sugar twice a day.  vary the time of day when you check, between before the 3 meals, and at bedtime.  also check if you have symptoms of your blood sugar being too high or too low.  please keep a record of the readings and bring it to your next appointment here (or you can bring the meter itself).  You can write it on any piece of paper.  please call us sooner if your blood sugar goes below 70, or if you have a lot of readings over 200.  "basaglar," 20 units at bedtime, and:  chnage the humalog to 3 times a day (just before each meal), 15-25-15 units.  I have sent a prescription to your pharmacy, to change the metformin to extended-release, which is easier on your stomach.  Please come back for a follow-up appointment in 3 months.

## 2016-03-16 NOTE — Progress Notes (Signed)
Subjective:    Patient ID: Judith [name], female    DOB: 02-Jun-1950, 65 y.o.   MRN: [id number]  HPI Pt returns for f/u of hyperthyroidism (pt says she took synthroid for a brief time in the 1990's, in an attempt to shrink a goiter; slightly suppressed TSH was first noted in late 2016, when she was in the hospital for new-onset AF; she converted back to SR; nuc med scan showed heterogeneous uptake; neck scar is from C-spine procedure). she takes tapazole as rx'ed. pt states she feels better in general, since recent amputation.  Pt also requests rx for DM: DM type: Insulin-requiring type 2. Dx'ed: 0000000 Complications: polyneuropathy, nephropathy, retinopathy, and CAD.  Therapy: insulin since 1997 GDM: never DKA: never Severe hypoglycemia: never Pancreatitis: never Other: she takes multiple daily injections.  Interval history: no cbg record, but states cbg's vary from 79-155.  It is lowest at lunch, and highest in the afternoon.  She has had several episodes of mild hypoglycemia, in the few days after her right foot amputation, 5 weeks ago. Chronic nausea persists.   Past Medical History:  Diagnosis Date  . Abnormal EKG 07/31/2013  . Arthritis    "hands" (03/06/2015)  . Asthma   . Carpal tunnel syndrome, bilateral   . Carpal tunnel syndrome, bilateral   . Chronic kidney disease   . Complication of anesthesia    slow to wake up  . Coronary artery disease     2 v CAD with CTO of the RCA and high grade bifurcational LCx/OM stenosis. S/P PCI DES x 2 to the LCx/OM.  Marland Kitchen Diabetic peripheral neuropathy ([place]) "since 1996"  . GERD (gastroesophageal reflux disease)   . Goiter   . Headache    migraines prior to menopause  . History of shingles 06/01/2013  . Hyperlipidemia   . Hyperlipidemia LDL goal <70 10/13/2015  . Hypertension   . Hyperthyroidism   . PAF (paroxysmal atrial fibrillation) ([place]) 04/29/2015   CHADS2VASC score of 4 now on Apixaban  . Paroxysmal atrial fibrillation with rapid  ventricular response ([place]) 11/15/2015  . Pneumonia ~ 1976  . Tremors of nervous system   . Type II diabetes mellitus (HCC)    insulin dependent    Past Surgical History:  Procedure Laterality Date  . ABDOMINAL HYSTERECTOMY  1988   age 34; CERVICAL DYSPLASIA; ovaries intact.   . AMPUTATION Right 01/23/2016   Procedure: Right 3rd Ray Amputation;  Surgeon: [name], MD;  Location: [facility];  Service: Orthopedics;  Laterality: Right;  . AMPUTATION Right 02/13/2016   Procedure: Right Transmetatarsal Amputation;  Surgeon: [name], MD;  Location: [facility];  Service: Orthopedics;  Laterality: Right;  . CARDIAC CATHETERIZATION N/A 02/27/2015   Procedure: Left Heart Cath and Coronary Angiography;  Surgeon: [name], MD; LAD 40%, mCFX 80%, OM 70%, RCA 100% calcified       . CARDIAC CATHETERIZATION N/A 03/06/2015   Procedure: Coronary Stent Intervention;  Surgeon: [name], MD;  Location: [facility];  Service: Cardiovascular;  Laterality: N/A;  Mid CX 3.50x12 promus DES w/ 0% resdual and Prox OM1 2.50x20 promus DES w/ 20% residual  . CARPAL TUNNEL RELEASE Right Nov 2015  . CARPAL TUNNEL RELEASE Right 1992; 05/2014   [name]; [place]  . CESAREAN SECTION  1982; 1984  . FOOT NEUROMA SURGERY Bilateral 2000  . KNEE ARTHROSCOPY Right ~ 2003   "meniscus repair"  . SHOULDER OPEN ROTATOR CUFF REPAIR Right 1996; 1998   "w/fracture  repair"  . THYROID SURGERY  2000   "removed lots of nodules"  . TONSILLECTOMY  1976    Social History   Social History  . Marital status: Divorced    Spouse name: N/A  . Number of children: 2  . Years of education: Masters   Occupational History  . Food Therapist, nutritional    Social History Main Topics  . Smoking status: Former Smoker    Packs/day: 0.00    Years: 41.00    Types: Cigarettes    Quit date: 03/05/2015  . Smokeless tobacco: Never Used     Comment: 04/29/2015 "quit smoking cigarettes 02/27/2015"  . Alcohol use No  . Drug use: No    . Sexual activity: Not Currently    Birth control/ protection: Post-menopausal, Surgical   Other Topics Concern  . 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      Lives with sister in [place].      Tobacco: 1 ppd x 41 years - quit 2016      Alcohol: none      Drugs: none      Exercise:  Walking in neighborhood; physical job.   Right-handed.   2 cups caffeine daily.       Current Outpatient Prescriptions on File Prior to Visit  Medication Sig Dispense Refill  . albuterol (PROVENTIL HFA) 108 (90 Base) MCG/ACT inhaler Inhale 1-2 puffs into the lungs every 6 (six) hours as needed for wheezing or shortness of breath. 1 Inhaler 0  . amLODipine (NORVASC) 5 MG tablet Take 1 tablet (5 mg total) by mouth daily. 30 tablet 11  . apixaban (ELIQUIS) 5 MG TABS tablet Take 1 tablet (5 mg total) by mouth 2 (two) times daily. 60 tablet 11  . atorvastatin (LIPITOR) 20 MG tablet Take 1 tablet (20 mg total) by mouth daily. 90 tablet 1  . buPROPion (WELLBUTRIN SR) 150 MG 12 hr tablet Take 1 tablet (150 mg total) by mouth 2 (two) times daily. 180 tablet 3  . clindamycin (CLEOCIN) 300 MG capsule Take 300 mg by mouth See admin instructions. Take 1 capsule (300 mg) one hour prior to dental appointment - last taken 02/05/16    . clopidogrel (PLAVIX) 75 MG tablet Take 1 tablet (75 mg total) by mouth daily. 30 tablet 11  . cyclobenzaprine (FLEXERIL) 5 MG tablet Take 5 mg by mouth at bedtime as needed for muscle spasms.    Marland Kitchen doxycycline (VIBRAMYCIN) 100 MG capsule Take 100 mg by mouth 2 (two) times daily. 30 day course filled 01/30/16    . FLUoxetine (PROZAC) 20 MG capsule Take 1 capsule (20 mg total) by mouth at bedtime. 90 capsule 3  . fluticasone (FLOVENT HFA) 44 MCG/ACT inhaler Inhale  2 puffs into the lungs 2 (two) times daily as needed (shortness of breath/ wheezing).     . gabapentin (NEURONTIN) 300 MG capsule TAKE 3 CAPSULES BY MOUTH AT morning AND 3 CAPSULES AT BEDTIME (Patient taking differently: Take 900 mg by mouth 2 (two) times daily. TAKE 3 CAPSULES BY MOUTH EVERY  MORNING AND 3 CAPSULES AT BEDTIME) 540 capsule 1  . HYDROcodone-acetaminophen (NORCO/VICODIN) 5-325 MG tablet Take 1-2 tablets by mouth every 6 (six) hours as needed for moderate pain. [place]  tablet 0  . Insulin Glargine (BASAGLAR KWIKPEN) 100 UNIT/ML SOPN Inject 0.2 mLs (20 Units total) into the skin daily at 10 pm. 5 pen 11  . loratadine (CLARITIN) 10 MG tablet Take 10 mg by mouth daily as needed for allergies.    Marland Kitchen losartan (COZAAR) 50 MG tablet Take 1 tablet (50 mg total) by mouth daily. 30 tablet 11  . methimazole (TAPAZOLE) 10 MG tablet Take 1 tablet (10 mg total) by mouth daily. 30 tablet 11  . metoprolol succinate (TOPROL-XL) 25 MG 24 hr tablet Take 1 tablet (25 mg total) by mouth 2 (two) times daily. Take with or immediately following a meal. 60 tablet 11  . nitroGLYCERIN (NITRODUR - DOSED IN MG/24 HR) 0.2 mg/hr patch Place 0.1 mg onto the skin daily. Patch burns patient so she cuts it in half    . nitroGLYCERIN (NITROSTAT) 0.4 MG SL tablet Place 1 tablet (0.4 mg total) under the tongue every 5 (five) minutes as needed for chest pain. 25 tablet 3  . ondansetron (ZOFRAN) 4 MG tablet Take 1 tablet (4 mg total) by mouth every 8 (eight) hours as needed for nausea or vomiting. 20 tablet 0  . pantoprazole (PROTONIX) 40 MG tablet Take 1 tablet (40 mg total) by mouth daily. 90 tablet 3  . sotalol (BETAPACE) 80 MG tablet Take 1 tablet (80 mg total) by mouth every 12 (twelve) hours. 60 tablet 11  . traMADol (ULTRAM) 50 MG tablet Take 1 tablet (50 mg total) by mouth every 6 (six) hours as needed. Maximum dose= 8 tablets per day (Patient taking differently: Take 50 mg by mouth every 6 (six) hours as needed (pain).  Maximum dose= 8 tablets per day) 60 tablet 0   No current facility-administered medications on file prior to visit.     Allergies  Allergen Reactions  . Novolog [Insulin Aspart] Shortness Of Breath    "breathing problems"  . Codeine Nausea And Vomiting    HIGH DOSES-SEVERE VOMITING  . Iodine Other (See Comments)    MUST HAVE BENADRYL PRIOR TO PROCEDURE AND RIGHT BEFORE TREATMENT TO COUNTERACT REACTION-BLISTERING REACTION DERMATOLOGICAL  . Penicillins Itching, Rash and Other (See Comments)    Has patient had a PCN reaction causing immediate rash, facial/tongue/throat swelling, SOB or lightheadedness with hypotension: Yes Has patient had a PCN reaction causing severe rash involving mucus membranes or skin necrosis: No Has patient had a PCN reaction that required hospitalization No Has patient had a PCN reaction occurring within the last 10 years: No If all of the above answers are "NO", then may proceed with Cephalosporin use.  CHEST SIZED RASH AND ITCHING   . Propofol Other (See Comments)    "Breathing problems - asthma attack"  . Ace Inhibitors Cough  . Demerol [Meperidine] Nausea And Vomiting  . Neosporin [Neomycin-Bacitracin Zn-Polymyx] Itching and Rash    MAKES REACTIONS WORSE WHEN USING AS PROPHYLACTIC  . Percocet [Oxycodone-Acetaminophen] Rash  . Tape Itching and Rash    Family History  Problem Relation Age of Onset  . Cancer Mother 69    bronchial cancer  . Breast cancer Mother   . Lung cancer Mother   . Hypertension Father   . COPD Father   . Heart disease Father 82    CAD with cardiac stenting  . Heart attack Father   . Parkinson's disease Father   . Allergies Sister   . Breast cancer Maternal Grandmother   . Emphysema Maternal Grandfather   . Leukemia Paternal Grandmother   .  Emphysema Paternal Grandfather   . Thyroid disease Neg Hx     BP (!) 146/82   Pulse 81   Ht 5\' 4"  (1.626 m)   Wt 180 lb (81.6 kg)   SpO2 97%   BMI 30.90 kg/m    Review of  Systems Denies fever.  She has weight gain.      Objective:   Physical Exam VITAL SIGNS:  See vs page GENERAL: no distress.  In wheelchair Pulses: left dorsalis pedis intact.   MSK: no deformity of the feet, except for right transmetatarsal amputation CV: trace bilat leg edema Skin: normal color and temp on the feet.  Neuro: sensation is intact to touch on the feet, but severely decreased from normal Ext: There is onychomycosis of the left foot toenails.     A1c=7.5%    Assessment & Plan:  Hyperthyroidism, due for recheck Insulin-requiring type 2 DM, with PAD: Based on the pattern of her cbg's, she needs some adjustment in her therapy Nausea, possibly due to metformin  Patient is advised the following: Patient Instructions  Thyroid blood tests are requested for you today.  Please do anywhere you want.  We'll let you know about the results. if ever you have fever while taking methimazole, stop it and call us, even if the reason is obvious, because of the risk of a rare side-effect. check your blood sugar twice a day.  vary the time of day when you check, between before the 3 meals, and at bedtime.  also check if you have symptoms of your blood sugar being too high or too low.  please keep a record of the readings and bring it to your next appointment here (or you can bring the meter itself).  You can write it on any piece of paper.  please call us sooner if your blood sugar goes below 70, or if you have a lot of readings over 200.  "basaglar," 20 units at bedtime, and:  chnage the humalog to 3 times a day (just before each meal), 15-25-15 units.  I have sent a prescription to your pharmacy, to change the metformin to extended-release, which is easier on your stomach.  Please come back for a follow-up appointment in 3 months.

## 2016-03-18 ENCOUNTER — Ambulatory Visit (INDEPENDENT_AMBULATORY_CARE_PROVIDER_SITE_OTHER): Payer: Managed Care, Other (non HMO) | Admitting: Orthopedic Surgery

## 2016-03-18 ENCOUNTER — Ambulatory Visit (INDEPENDENT_AMBULATORY_CARE_PROVIDER_SITE_OTHER): Payer: Managed Care, Other (non HMO) | Admitting: Sports Medicine

## 2016-03-18 ENCOUNTER — Encounter (INDEPENDENT_AMBULATORY_CARE_PROVIDER_SITE_OTHER): Payer: Self-pay | Admitting: Orthopedic Surgery

## 2016-03-18 DIAGNOSIS — Z89439 Acquired absence of unspecified foot: Secondary | ICD-10-CM

## 2016-03-18 NOTE — Progress Notes (Signed)
Wound Care Note   Patient: Judith [name]           Date of Birth: 1951/04/16           MRN: [id number]             PCP: Reginia Forts, MD Visit Date: 03/18/2016   Assessment & Plan: Visit Diagnoses:  1. Status post transmetatarsal amputation of foot, unspecified laterality ([place])     Plan: Continue daily wound cleansing. Silvadene dressings. Pack wound open. Continue nonweightbearing. Importance of nonweightbearing was emphasized. Have provided her with a [place] shoe, as she has been  weightbearing in a regular shoe. Continue out of work.  Follow-Up Instructions: Return in about 2 weeks (around 04/01/2016).  Orders:  No orders of the defined types were placed in this encounter.  No orders of the defined types were placed in this encounter.     Procedures: No notes on file   Clinical Data: No additional findings.   No images are attached to the encounter.   Subjective: Chief Complaint  Patient presents with  . Right Foot - Routine Post Op    02/13/16 right transmetatarsal amputation     Pt is 1 month s./p transmetatarsal amputation. She is non weight bearing in a wheelchair and wearing the Vive compression sock.. A small open area on the lateral side of the incision with pale oink tissue. No redness, no odor, no drainage and no complaints of pain. Feels well on concerns.    Review of Systems  Constitutional: Negative for chills and fever.  All other systems reviewed and are negative.   Miscellaneous:  -[place] of Work?: yes     Objective: Vital Signs: Ht 5\' 4"  (1.626 m)   Wt 180 lb (81.6 kg)   BMI 30.90 kg/m   Physical Exam: Him the transmetatarsal amputation is slowly healing. There is a 25 mm x 10 mm open area laterally. This is 2 mm deep with fibrinous exudative tissue in the wound bed. Debrided this with gauze. No drainage surrounding maceration erythema or sign of infection. Medially there is a 5 mm x 1 mm open area with granulation  tissue. This will heal up nicely.  Specialty Comments: No specialty comments available.   PMFS History: Patient Active Problem List   Diagnosis Date Noted  . Obstructive sleep apnea 11/26/2015  . Bilateral carpal tunnel syndrome 11/26/2015  . Hypomagnesemia 11/16/2015  . Hyperlipidemia LDL goal <70 10/13/2015  . Abnormality of gait 09/02/2015  . Memory loss 08/12/2015  . Diabetic peripheral neuropathy ([place]) 08/12/2015  . Vitamin D deficiency 08/12/2015  . Hyperthyroidism 04/30/2015  . Heme positive stool   . PAF (paroxysmal atrial fibrillation) ([place]) 04/29/2015  . Coronary artery disease involving native coronary artery of native heart without angina pectoris 03/29/2015  . Groin hematoma 13-Oct-202016  . Exertional angina ([place]) 03/06/2015  . Abnormal nuclear stress test   . Acute kidney injury ([place]) 02/12/2015  . Acute hyponatremia 02/12/2015  . History of goiter 09/28/2014  . GERD (gastroesophageal reflux disease) 07/31/2013  . Depression with anxiety 06/01/2013  . HTN (hypertension) 01/30/2013  . Diabetic neuropathy, painful ([place]) 01/30/2013  . Tobacco user 01/30/2013   Past Medical History:  Diagnosis Date  . Abnormal EKG 07/31/2013  . Arthritis    "hands" (03/06/2015)  . Asthma   . Carpal tunnel syndrome, bilateral   . Carpal tunnel syndrome, bilateral   . Chronic kidney disease   . Complication of anesthesia    slow  to wake up  . Coronary artery disease     2 v CAD with CTO of the RCA and high grade bifurcational LCx/OM stenosis. S/P PCI DES x 2 to the LCx/OM.  Marland Kitchen Diabetic peripheral neuropathy ([place]) "since 1996"  . GERD (gastroesophageal reflux disease)   . Goiter   . Headache    migraines prior to menopause  . History of shingles 06/01/2013  . Hyperlipidemia   . Hyperlipidemia LDL goal <70 10/13/2015  . Hypertension   . Hyperthyroidism   . PAF (paroxysmal atrial fibrillation) ([place]) 04/29/2015   CHADS2VASC score of 4 now on Apixaban  . Paroxysmal atrial  fibrillation with rapid ventricular response ([place]) 11/15/2015  . Pneumonia ~ 1976  . Tremors of nervous system   . Type II diabetes mellitus (HCC)    insulin dependent    Family History  Problem Relation Age of Onset  . Cancer Mother 52    bronchial cancer  . Breast cancer Mother   . Lung cancer Mother   . Hypertension Father   . COPD Father   . Heart disease Father 55    CAD with cardiac stenting  . Heart attack Father   . Parkinson's disease Father   . Allergies Sister   . Breast cancer Maternal Grandmother   . Emphysema Maternal Grandfather   . Leukemia Paternal Grandmother   . Emphysema Paternal Grandfather   . Thyroid disease Neg Hx    Past Surgical History:  Procedure Laterality Date  . ABDOMINAL HYSTERECTOMY  1988   age 65; CERVICAL DYSPLASIA; ovaries intact.   . AMPUTATION Right 01/23/2016   Procedure: Right 3rd Ray Amputation;  Surgeon: [name], MD;  Location: [facility];  Service: Orthopedics;  Laterality: Right;  . AMPUTATION Right 02/13/2016   Procedure: Right Transmetatarsal Amputation;  Surgeon: [name], MD;  Location: [facility];  Service: Orthopedics;  Laterality: Right;  . CARDIAC CATHETERIZATION N/A 02/27/2015   Procedure: Left Heart Cath and Coronary Angiography;  Surgeon: [name], MD; LAD 40%, mCFX 80%, OM 70%, RCA 100% calcified       . CARDIAC CATHETERIZATION N/A 03/06/2015   Procedure: Coronary Stent Intervention;  Surgeon: [name], MD;  Location: [facility];  Service: Cardiovascular;  Laterality: N/A;  Mid CX 3.50x12 promus DES w/ 0% resdual and Prox OM1 2.50x20 promus DES w/ 20% residual  . CARPAL TUNNEL RELEASE Right Nov 2015  . CARPAL TUNNEL RELEASE Right 1992; 05/2014   [name]; [place]  . CESAREAN SECTION  1982; 1984  . FOOT NEUROMA SURGERY Bilateral 2000  . KNEE ARTHROSCOPY Right ~ 2003   "meniscus repair"  . SHOULDER OPEN ROTATOR CUFF REPAIR Right 1996; 1998   "w/fracture repair"  . THYROID SURGERY  2000   "removed  lots of nodules"  . TONSILLECTOMY  1976   Social History   Occupational History  . Food Therapist, nutritional    Social History Main Topics  . Smoking status: Former Smoker    Packs/day: 0.00    Years: 41.00    Types: Cigarettes    Quit date: 03/05/2015  . Smokeless tobacco: Never Used     Comment: 04/29/2015 "quit smoking cigarettes 02/27/2015"  . Alcohol use No  . Drug use: No  . Sexual activity: Not Currently    Birth control/ protection: Post-menopausal, Surgical

## 2016-03-23 ENCOUNTER — Telehealth: Payer: Self-pay

## 2016-03-23 NOTE — Telephone Encounter (Signed)
Metoprolol XL 25mg  approved by [employer]. Authorization is good through 03/22/2017. Pharmacy notified.

## 2016-03-24 ENCOUNTER — Other Ambulatory Visit: Payer: Self-pay | Admitting: Endocrinology

## 2016-03-24 ENCOUNTER — Other Ambulatory Visit (INDEPENDENT_AMBULATORY_CARE_PROVIDER_SITE_OTHER): Payer: Managed Care, Other (non HMO)

## 2016-03-24 ENCOUNTER — Ambulatory Visit (INDEPENDENT_AMBULATORY_CARE_PROVIDER_SITE_OTHER): Payer: Managed Care, Other (non HMO) | Admitting: Orthopedic Surgery

## 2016-03-24 ENCOUNTER — Encounter (INDEPENDENT_AMBULATORY_CARE_PROVIDER_SITE_OTHER): Payer: Self-pay | Admitting: Orthopedic Surgery

## 2016-03-24 VITALS — Ht 64.0 in | Wt 180.0 lb

## 2016-03-24 DIAGNOSIS — Z89431 Acquired absence of right foot: Secondary | ICD-10-CM

## 2016-03-24 DIAGNOSIS — E0590 Thyrotoxicosis, unspecified without thyrotoxic crisis or storm: Secondary | ICD-10-CM | POA: Diagnosis not present

## 2016-03-24 DIAGNOSIS — L97511 Non-pressure chronic ulcer of other part of right foot limited to breakdown of skin: Secondary | ICD-10-CM

## 2016-03-24 LAB — T4, FREE: Free T4: 0.74 ng/dL (ref 0.60–1.60)

## 2016-03-24 LAB — TSH: TSH: 2.99 u[IU]/mL (ref 0.35–4.50)

## 2016-03-24 MED ORDER — SILVER SULFADIAZINE 1 % EX CREA: 1.0000 "application " | TOPICAL_CREAM | Freq: Every day | CUTANEOUS | 0 refills | Status: DC

## 2016-03-24 NOTE — Progress Notes (Signed)
Wound Care Note   Patient: Judith [name]           Date of Birth: [birth year]           MRN: [id number]             PCP: Reginia Forts, MD Visit Date: 03/24/2016   Assessment & Plan: Visit Diagnoses:  1. Ulcer of right foot, limited to breakdown of skin ([place])   2. S/P transmetatarsal amputation of foot, right ([place])     Plan: We'll follow up next week. Patient has had acute dehiscence of her right transmetatarsal amputation she has little bit of redness at the wound has good granulation tissue however the wound is 1 cm deep and 2 cm in diameter. Patient will be started back on her doxycycline her nitroglycerin patch and Silvadene. Follow-up next week   Follow-Up Instructions: Return in about 1 week (around 03/31/2016).  Orders:  No orders of the defined types were placed in this encounter.  Meds ordered this encounter  Medications  . silver sulfADIAZINE (SILVADENE) 1 % cream    Sig: Apply 1 application topically daily.    Dispense:  400 g    Refill:  0      Procedures: No notes on file   Clinical Data: No additional findings.   No images are attached to the encounter.   Subjective: Chief Complaint  Patient presents with  . Right Foot - Open Wound    Right transmetatarsal amputation 02/13/16 5 weeks 5 days post op    Right transmetatarsal amputation 02/13/16 5 weeks 5 days post op. Her foot was doing great and was almost closed but on Monday she woke up with wound dehiscence. There is redness and wound opening laterally. She has had nausea. She has had change in medications due to her nausea. It was metformin she is now getting the extended release form. She denies any fever. She is nonweightbearing.    Review of Systems  Miscellaneous:  -Home Health Care: never had one  Objective: Vital Signs: Ht 5\' 4"  (1.626 m)   Wt 180 lb (81.6 kg)   BMI 30.90 kg/m   Physical Exam: Examination patient has dehiscence of the lateral aspect of the wound this has 75% beefy  granulation tissue no exposed bone no ascending cellulitis that she does have some local redness.  Specialty Comments: No specialty comments available.   PMFS History: Patient Active Problem List   Diagnosis Date Noted  . Obstructive sleep apnea 11/26/2015  . Bilateral carpal tunnel syndrome 11/26/2015  . Hypomagnesemia 11/16/2015  . Hyperlipidemia LDL goal <70 10/13/2015  . Abnormality of gait 09/02/2015  . Memory loss 08/12/2015  . Diabetic peripheral neuropathy ([place]) 08/12/2015  . Vitamin D deficiency 08/12/2015  . Hyperthyroidism 04/30/2015  . Heme positive stool   . PAF (paroxysmal atrial fibrillation) ([place]) 04/29/2015  . Coronary artery disease involving native coronary artery of native heart without angina pectoris 03/29/2015  . Groin hematoma 27-Jul-202016  . Exertional angina ([place]) 03/06/2015  . Abnormal nuclear stress test   . Acute kidney injury ([place]) 02/12/2015  . Acute hyponatremia 02/12/2015  . History of goiter 09/28/2014  . GERD (gastroesophageal reflux disease) 07/31/2013  . Depression with anxiety 06/01/2013  . HTN (hypertension) 01/30/2013  . Diabetic neuropathy, painful ([place]) 01/30/2013  . Tobacco user 01/30/2013   Past Medical History:  Diagnosis Date  . Abnormal EKG 07/31/2013  . Arthritis    "hands" (03/06/2015)  . Asthma   . Carpal tunnel syndrome, bilateral   .  Carpal tunnel syndrome, bilateral   . Chronic kidney disease   . Complication of anesthesia    slow to wake up  . Coronary artery disease     2 v CAD with CTO of the RCA and high grade bifurcational LCx/OM stenosis. S/P PCI DES x 2 to the LCx/OM.  Marland Kitchen Diabetic peripheral neuropathy ([place]) "since 1996"  . GERD (gastroesophageal reflux disease)   . Goiter   . Headache    migraines prior to menopause  . History of shingles 06/01/2013  . Hyperlipidemia   . Hyperlipidemia LDL goal <70 10/13/2015  . Hypertension   . Hyperthyroidism   . PAF (paroxysmal atrial fibrillation) ([place]) 04/29/2015    CHADS2VASC score of 4 now on Apixaban  . Paroxysmal atrial fibrillation with rapid ventricular response ([place]) 11/15/2015  . Pneumonia ~ 1976  . Tremors of nervous system   . Type II diabetes mellitus (HCC)    insulin dependent    Family History  Problem Relation Age of Onset  . Cancer Mother 39    bronchial cancer  . Breast cancer Mother   . Lung cancer Mother   . Hypertension Father   . COPD Father   . Heart disease Father 64    CAD with cardiac stenting  . Heart attack Father   . Parkinson's disease Father   . Allergies Sister   . Breast cancer Maternal Grandmother   . Emphysema Maternal Grandfather   . Leukemia Paternal Grandmother   . Emphysema Paternal Grandfather   . Thyroid disease Neg Hx    Past Surgical History:  Procedure Laterality Date  . ABDOMINAL HYSTERECTOMY  1988   age 68; CERVICAL DYSPLASIA; ovaries intact.   . AMPUTATION Right 01/23/2016   Procedure: Right 3rd Ray Amputation;  Surgeon: [name], MD;  Location: [facility];  Service: Orthopedics;  Laterality: Right;  . AMPUTATION Right 02/13/2016   Procedure: Right Transmetatarsal Amputation;  Surgeon: [name], MD;  Location: [facility];  Service: Orthopedics;  Laterality: Right;  . CARDIAC CATHETERIZATION N/A 02/27/2015   Procedure: Left Heart Cath and Coronary Angiography;  Surgeon: [name], MD; LAD 40%, mCFX 80%, OM 70%, RCA 100% calcified       . CARDIAC CATHETERIZATION N/A 03/06/2015   Procedure: Coronary Stent Intervention;  Surgeon: [name], MD;  Location: [facility];  Service: Cardiovascular;  Laterality: N/A;  Mid CX 3.50x12 promus DES w/ 0% resdual and Prox OM1 2.50x20 promus DES w/ 20% residual  . CARPAL TUNNEL RELEASE Right Nov 2015  . CARPAL TUNNEL RELEASE Right 1992; 05/2014   [name]; [place]  . CESAREAN SECTION  1982; 1984  . FOOT NEUROMA SURGERY Bilateral 2000  . KNEE ARTHROSCOPY Right ~ 2003   "meniscus repair"  . SHOULDER OPEN ROTATOR CUFF REPAIR Right 1996; 1998    "w/fracture repair"  . THYROID SURGERY  2000   "removed lots of nodules"  . TONSILLECTOMY  1976   Social History   Occupational History  . Food Therapist, nutritional    Social History Main Topics  . Smoking status: Former Smoker    Packs/day: 0.00    Years: 41.00    Types: Cigarettes    Quit date: 03/05/2015  . Smokeless tobacco: Never Used     Comment: 04/29/2015 "quit smoking cigarettes 02/27/2015"  . Alcohol use No  . Drug use: No  . Sexual activity: Not Currently    Birth control/ protection: Post-menopausal, Surgical

## 2016-03-31 ENCOUNTER — Ambulatory Visit (INDEPENDENT_AMBULATORY_CARE_PROVIDER_SITE_OTHER): Payer: Managed Care, Other (non HMO) | Admitting: Orthopedic Surgery

## 2016-03-31 ENCOUNTER — Encounter (INDEPENDENT_AMBULATORY_CARE_PROVIDER_SITE_OTHER): Payer: Self-pay | Admitting: Orthopedic Surgery

## 2016-03-31 VITALS — Ht 64.0 in | Wt 180.0 lb

## 2016-03-31 DIAGNOSIS — Z89431 Acquired absence of right foot: Secondary | ICD-10-CM

## 2016-03-31 NOTE — Progress Notes (Signed)
Wound Care Note   Patient: Judith [name]           Date of Birth: Jan 09, 1951           MRN: [id number]             PCP: Reginia Forts, MD Visit Date: 03/31/2016   Assessment & Plan: Visit Diagnoses:  1. Status post transmetatarsal amputation of foot, right ([place])   With much improved ulcer distally  Plan: Follow-up in 3 weeks. Patient will continue the nitroglycerin patch continue the doxycycline continue with Silvadene dressing changes.   Follow-Up Instructions: Return in about 3 weeks (around 04/21/2016).  Orders:  No orders of the defined types were placed in this encounter.  No orders of the defined types were placed in this encounter.     Procedures: No notes on file   Clinical Data: No additional findings.   No images are attached to the encounter.   Subjective: Chief Complaint  Patient presents with  . Right Foot - Routine Post Op    Right transmetatarsal amputation. 02/13/16 Approximately 63 weeks post op.    Patient is here for follow up right transmetatarsal amputation wound dehiscence. She was seen in office and started on doxycycline and silvadene dressing changes. She has continued with both. She feels the wound is looking better. There is no odor, there is minimal redness. She does have fibrinous exudate.    Review of Systems    Objective: Vital Signs: Ht 5\' 4"  (1.626 m)   Wt 180 lb (81.6 kg)   BMI 30.90 kg/m   Physical Exam: On examination patient's wound has fibrinous exudative tissue. After debridement the wound bed has good beefy granulation tissue approximate 75% the wound is 2 x 3 cm and 5 mm deep. She has shown excellent improvement.  Specialty Comments: No specialty comments available.   PMFS History: Patient Active Problem List   Diagnosis Date Noted  . Obstructive sleep apnea 11/26/2015  . Bilateral carpal tunnel syndrome 11/26/2015  . Hypomagnesemia 11/16/2015  . Hyperlipidemia LDL goal <70 10/13/2015  . Abnormality of gait  09/02/2015  . Memory loss 08/12/2015  . Diabetic peripheral neuropathy ([place]) 08/12/2015  . Vitamin D deficiency 08/12/2015  . Hyperthyroidism 04/30/2015  . Heme positive stool   . PAF (paroxysmal atrial fibrillation) ([place]) 04/29/2015  . Coronary artery disease involving native coronary artery of native heart without angina pectoris 03/29/2015  . Groin hematoma 10-14-202016  . Exertional angina ([place]) 03/06/2015  . Abnormal nuclear stress test   . Acute kidney injury ([place]) 02/12/2015  . Acute hyponatremia 02/12/2015  . History of goiter 09/28/2014  . GERD (gastroesophageal reflux disease) 07/31/2013  . Depression with anxiety 06/01/2013  . HTN (hypertension) 01/30/2013  . Diabetic neuropathy, painful ([place]) 01/30/2013  . Tobacco user 01/30/2013   Past Medical History:  Diagnosis Date  . Abnormal EKG 07/31/2013  . Arthritis    "hands" (03/06/2015)  . Asthma   . Carpal tunnel syndrome, bilateral   . Carpal tunnel syndrome, bilateral   . Chronic kidney disease   . Complication of anesthesia    slow to wake up  . Coronary artery disease     2 v CAD with CTO of the RCA and high grade bifurcational LCx/OM stenosis. S/P PCI DES x 2 to the LCx/OM.  Marland Kitchen Diabetic peripheral neuropathy ([place]) "since 1996"  . GERD (gastroesophageal reflux disease)   . Goiter   . Headache    migraines prior to menopause  . History of shingles  06/01/2013  . Hyperlipidemia   . Hyperlipidemia LDL goal <70 10/13/2015  . Hypertension   . Hyperthyroidism   . PAF (paroxysmal atrial fibrillation) ([place]) 04/29/2015   CHADS2VASC score of 4 now on Apixaban  . Paroxysmal atrial fibrillation with rapid ventricular response ([place]) 11/15/2015  . Pneumonia ~ 1976  . Tremors of nervous system   . Type II diabetes mellitus (HCC)    insulin dependent    Family History  Problem Relation Age of Onset  . Cancer Mother 61    bronchial cancer  . Breast cancer Mother   . Lung cancer Mother   . Hypertension Father   . COPD  Father   . Heart disease Father 23    CAD with cardiac stenting  . Heart attack Father   . Parkinson's disease Father   . Allergies Sister   . Breast cancer Maternal Grandmother   . Emphysema Maternal Grandfather   . Leukemia Paternal Grandmother   . Emphysema Paternal Grandfather   . Thyroid disease Neg Hx    Past Surgical History:  Procedure Laterality Date  . ABDOMINAL HYSTERECTOMY  1988   age 65; CERVICAL DYSPLASIA; ovaries intact.   . AMPUTATION Right 01/23/2016   Procedure: Right 3rd Ray Amputation;  Surgeon: [name], MD;  Location: [facility];  Service: Orthopedics;  Laterality: Right;  . AMPUTATION Right 02/13/2016   Procedure: Right Transmetatarsal Amputation;  Surgeon: [name], MD;  Location: [facility];  Service: Orthopedics;  Laterality: Right;  . CARDIAC CATHETERIZATION N/A 02/27/2015   Procedure: Left Heart Cath and Coronary Angiography;  Surgeon: [name], MD; LAD 40%, mCFX 80%, OM 70%, RCA 100% calcified       . CARDIAC CATHETERIZATION N/A 03/06/2015   Procedure: Coronary Stent Intervention;  Surgeon: [name], MD;  Location: [facility];  Service: Cardiovascular;  Laterality: N/A;  Mid CX 3.50x12 promus DES w/ 0% resdual and Prox OM1 2.50x20 promus DES w/ 20% residual  . CARPAL TUNNEL RELEASE Right Nov 2015  . CARPAL TUNNEL RELEASE Right 1992; 05/2014   [name]; [place]  . CESAREAN SECTION  1982; 1984  . FOOT NEUROMA SURGERY Bilateral 2000  . KNEE ARTHROSCOPY Right ~ 2003   "meniscus repair"  . SHOULDER OPEN ROTATOR CUFF REPAIR Right 1996; 1998   "w/fracture repair"  . THYROID SURGERY  2000   "removed lots of nodules"  . TONSILLECTOMY  1976   Social History   Occupational History  . Food Therapist, nutritional    Social History Main Topics  . Smoking status: Former Smoker    Packs/day: 0.00    Years: 41.00    Types: Cigarettes    Quit date: 03/05/2015  . Smokeless tobacco: Never Used     Comment: 04/29/2015 "quit smoking cigarettes  02/27/2015"  . Alcohol use No  . Drug use: No  . Sexual activity: Not Currently    Birth control/ protection: Post-menopausal, Surgical

## 2016-04-04 ENCOUNTER — Other Ambulatory Visit: Payer: Self-pay | Admitting: Physician Assistant

## 2016-04-04 ENCOUNTER — Other Ambulatory Visit: Payer: Self-pay | Admitting: Cardiovascular Disease

## 2016-04-04 DIAGNOSIS — I2510 Atherosclerotic heart disease of native coronary artery without angina pectoris: Secondary | ICD-10-CM

## 2016-04-12 ENCOUNTER — Encounter: Payer: Self-pay | Admitting: Gastroenterology

## 2016-04-19 ENCOUNTER — Encounter (INDEPENDENT_AMBULATORY_CARE_PROVIDER_SITE_OTHER): Payer: Self-pay | Admitting: Orthopedic Surgery

## 2016-04-19 ENCOUNTER — Ambulatory Visit (INDEPENDENT_AMBULATORY_CARE_PROVIDER_SITE_OTHER): Payer: Managed Care, Other (non HMO) | Admitting: Family

## 2016-04-19 DIAGNOSIS — Z89431 Acquired absence of right foot: Secondary | ICD-10-CM

## 2016-04-19 MED ORDER — NITROGLYCERIN 0.2 MG/HR TD PT24: 0.1000 mg | MEDICATED_PATCH | Freq: Every day | TRANSDERMAL | 2 refills | Status: DC

## 2016-04-19 MED ORDER — COLLAGENASE 250 UNIT/GM EX OINT
1.0000 | TOPICAL_OINTMENT | Freq: Every day | CUTANEOUS | 0 refills | Status: DC
Start: 2016-04-19 — End: 2016-05-10

## 2016-04-19 NOTE — Progress Notes (Signed)
Wound Care Note   Patient: Judith [name]           Date of Birth: 1950-07-26           MRN: [id number]             PCP: Reginia Forts, MD Visit Date: 04/19/2016   Assessment & Plan: Visit Diagnoses: No diagnosis found.  Plan: Continue with daily wound care. Apply Santyl ointment in thin layer followed by dry dressing. Continue postop shoe. Elevate for swelling.  Follow-Up Instructions: No Follow-up on file.  Orders:  No orders of the defined types were placed in this encounter.  Meds ordered this encounter  Medications  . nitroGLYCERIN (NITRODUR - DOSED IN MG/24 HR) 0.2 mg/hr patch    Sig: Place 1 patch (0.2 mg total) onto the skin daily. Patch burns patient so she cuts it in half    Dispense:  30 patch    Refill:  2  . collagenase (SANTYL) ointment    Sig: Apply 1 application topically daily.    Dispense:  15 g    Refill:  0      Procedures: No notes on file   Clinical Data: No additional findings.   No images are attached to the encounter.   Subjective: Chief Complaint  Patient presents with  . 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 She was doing silvadene dressing changes. There is no odor, there is minimal redness. She does have fibrinous exudate. She is also wearing vive compression stocking for compression.    Review of Systems  Constitutional: Negative for chills and fever.      Objective: Vital Signs: Ht 5\' 4"  (1.626 m)   Wt 180 lb (81.6 kg)   BMI 30.90 kg/m   Physical Exam: Patient is alert and oriented. No adenopathy. Well-dressed. Normal affect. Respirations easy.  Does have an antalgic gait. Has been full weightbearing in a [place] shoe. There is a 3 cm x 1 cm ulceration to the lateral transmetatarsal amputation on the right. This is filled in with thin fibrinous exudative tissue which was debrided with gauze. There is no  drainage no surrounding erythema and no odor of infection.  Specialty Comments: No specialty comments available.   PMFS History: Patient Active Problem List   Diagnosis Date Noted  . Obstructive sleep apnea 11/26/2015  . Bilateral carpal tunnel syndrome 11/26/2015  . Hypomagnesemia 11/16/2015  . Hyperlipidemia LDL goal <70 10/13/2015  . Abnormality of gait 09/02/2015  . Memory loss 08/12/2015  . Diabetic peripheral neuropathy ([place]) 08/12/2015  . Vitamin D deficiency 08/12/2015  . Hyperthyroidism 04/30/2015  . Heme positive stool   . PAF (paroxysmal atrial fibrillation) ([place]) 04/29/2015  . Coronary artery disease involving native coronary artery of native heart without angina pectoris 03/29/2015  . Groin hematoma 2020/10/514  . Exertional angina ([place]) 03/06/2015  . Abnormal nuclear stress test   . Acute kidney injury ([place]) 02/12/2015  . Acute hyponatremia 02/12/2015  . History of goiter 09/28/2014  . GERD (gastroesophageal reflux disease) 07/31/2013  . Depression with anxiety 06/01/2013  . HTN (hypertension) 01/30/2013  . Diabetic neuropathy, painful ([place]) 01/30/2013  . Tobacco user 01/30/2013   Past Medical History:  Diagnosis Date  . Abnormal EKG 07/31/2013  . Arthritis    "hands" (03/06/2015)  . Asthma   . Carpal tunnel syndrome, bilateral   . Carpal tunnel syndrome, bilateral   .  Chronic kidney disease   . Complication of anesthesia    slow to wake up  . Coronary artery disease     2 v CAD with CTO of the RCA and high grade bifurcational LCx/OM stenosis. S/P PCI DES x 2 to the LCx/OM.  Marland Kitchen Diabetic peripheral neuropathy ([place]) "since 1996"  . GERD (gastroesophageal reflux disease)   . Goiter   . Headache    migraines prior to menopause  . History of shingles 06/01/2013  . Hyperlipidemia   . Hyperlipidemia LDL goal <70 10/13/2015  . Hypertension   . Hyperthyroidism   . PAF (paroxysmal atrial fibrillation) ([place]) 04/29/2015   CHADS2VASC score of 4 now on Apixaban  .  Paroxysmal atrial fibrillation with rapid ventricular response ([place]) 11/15/2015  . Pneumonia ~ 1976  . Tremors of nervous system   . Type II diabetes mellitus (HCC)    insulin dependent    Family History  Problem Relation Age of Onset  . Cancer Mother 15    bronchial cancer  . Breast cancer Mother   . Lung cancer Mother   . Hypertension Father   . COPD Father   . Heart disease Father 58    CAD with cardiac stenting  . Heart attack Father   . Parkinson's disease Father   . Allergies Sister   . Breast cancer Maternal Grandmother   . Emphysema Maternal Grandfather   . Leukemia Paternal Grandmother   . Emphysema Paternal Grandfather   . Thyroid disease Neg Hx    Past Surgical History:  Procedure Laterality Date  . ABDOMINAL HYSTERECTOMY  1988   age 42; CERVICAL DYSPLASIA; ovaries intact.   . AMPUTATION Right 01/23/2016   Procedure: Right 3rd Ray Amputation;  Surgeon: [name], MD;  Location: [facility];  Service: Orthopedics;  Laterality: Right;  . AMPUTATION Right 02/13/2016   Procedure: Right Transmetatarsal Amputation;  Surgeon: [name], MD;  Location: [place];  Service: Orthopedics;  Laterality: Right;  . CARDIAC CATHETERIZATION N/A 02/27/2015   Procedure: Left Heart Cath and Coronary Angiography;  Surgeon: [name], MD; LAD 40%, mCFX 80%, OM 70%, RCA 100% calcified       . CARDIAC CATHETERIZATION N/A 03/06/2015   Procedure: Coronary Stent Intervention;  Surgeon: [name], MD;  Location: [facility];  Service: Cardiovascular;  Laterality: N/A;  Mid CX 3.50x12 promus DES w/ 0% resdual and Prox OM1 2.50x20 promus DES w/ 20% residual  . CARPAL TUNNEL RELEASE Right Nov 2015  . CARPAL TUNNEL RELEASE Right 1992; 05/2014   [name]; [place]  . CESAREAN SECTION  1982; 1984  . FOOT NEUROMA SURGERY Bilateral 2000  . KNEE ARTHROSCOPY Right ~ 2003   "meniscus repair"  . SHOULDER OPEN ROTATOR CUFF REPAIR Right 1996; 1998   "w/fracture repair"  . THYROID SURGERY   2000   "removed lots of nodules"  . TONSILLECTOMY  1976   Social History   Occupational History  . Food Therapist, nutritional    Social History Main Topics  . Smoking status: Former Smoker    Packs/day: 0.00    Years: 41.00    Types: Cigarettes    Quit date: 03/05/2015  . Smokeless tobacco: Never Used     Comment: 04/29/2015 "quit smoking cigarettes 02/27/2015"  . Alcohol use No  . Drug use: No  . Sexual activity: Not Currently    Birth control/ protection: Post-menopausal, Surgical

## 2016-04-21 ENCOUNTER — Ambulatory Visit (INDEPENDENT_AMBULATORY_CARE_PROVIDER_SITE_OTHER): Payer: Self-pay | Admitting: Family

## 2016-04-21 VITALS — Ht 64.0 in | Wt 180.0 lb

## 2016-04-21 DIAGNOSIS — Z89431 Acquired absence of right foot: Secondary | ICD-10-CM

## 2016-04-21 NOTE — Progress Notes (Signed)
Office Visit Note   Patient: Judith [name]           Date of Birth: 07/06/1950           MRN: [id number] Visit Date: 04/21/2016              Requested by: [name], MD [address] [place], [place] [address] PCP: Reginia Forts, MD   Assessment & Plan: Visit Diagnoses:  1. S/P transmetatarsal amputation of foot, right [facility])     Plan: Our staff will continue working on prior [place] for [place]. Continue with Silvadene dressings. Continue protective shoe wear. Follow up in office in 2 more weeks. Elevate and minimize weight bearing.   Follow-Up Instructions: No Follow-up on file.   Orders:  No orders of the defined types were placed in this encounter.  No orders of the defined types were placed in this encounter.     Procedures: No procedures performed   Clinical Data: No additional findings.   Subjective: Chief Complaint  Patient presents with  . 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 Almost 10 weeks out from surgery. Pt states that she wa sin the office a few days ago and that everything was going well. Using Nitro patch, iodosorb dressing change twice a day. Pt states that since she has now had yellow drain on her dressing and she felt like something may not be right and wanted to come in and make sure that there are no signs of infections. Full weight bearing in a post op shoe. She states that she has not had pain in at least two weeks and no she states that she is having pain bottom of her foot with pressure. Open area lateral side of incision with slight maceration and flat pink tissue. No redness and no swelling. Pt states that she is allergic to iodine and wonders if the ointment could have caused her to have a bad reaction.      Review of Systems  Constitutional: Negative for chills and fever.     Objective: Vital Signs: Ht 5\' 4"  (1.626 m)   Wt 180 lb (81.6 kg)   BMI  30.90 kg/m   Physical Exam  Constitutional: She is oriented to person, place, and time. She appears well-developed and well-nourished.  Pulmonary/Chest: Effort normal.  Musculoskeletal:  Right transmetatarsal amputation is well healed medially. Laterally there is a 3 cm x 12 mm ulcer. This is 5 mm deep. Covered with a thin layer of exudative tissue. This was debrided with gauze. Scant clear drainage. No surrounding erythema, no purulence, no sign of infection.   Neurological: She is alert and oriented to person, place, and time.  Psychiatric: She has a normal mood and affect.  Nursing note reviewed.   Ortho Exam  Specialty Comments:  No specialty comments available.  Imaging: No results found.   PMFS History: Patient Active Problem List   Diagnosis Date Noted  . S/P transmetatarsal amputation of foot, right ([place]) 04/19/2016  . Obstructive sleep apnea 11/26/2015  . Bilateral carpal tunnel syndrome 11/26/2015  . Hypomagnesemia 11/16/2015  . Hyperlipidemia LDL goal <70 10/13/2015  . Abnormality of gait 09/02/2015  . Memory loss 08/12/2015  . Diabetic peripheral neuropathy ([place]) 08/12/2015  . Vitamin D deficiency 08/12/2015  . Hyperthyroidism 04/30/2015  . Heme positive stool   . PAF (paroxysmal atrial fibrillation) ([place]) 04/29/2015  . Coronary artery disease involving native coronary  artery of native heart without angina pectoris 03/29/2015  . Groin hematoma October 12, 202016  . Exertional angina ([place]) 03/06/2015  . Abnormal nuclear stress test   . Acute kidney injury ([place]) 02/12/2015  . Acute hyponatremia 02/12/2015  . History of goiter 09/28/2014  . GERD (gastroesophageal reflux disease) 07/31/2013  . Depression with anxiety 06/01/2013  . HTN (hypertension) 01/30/2013  . Diabetic neuropathy, painful ([place]) 01/30/2013  . Tobacco user 01/30/2013   Past Medical History:  Diagnosis Date  . Abnormal EKG 07/31/2013  . Arthritis    "hands" (03/06/2015)  . Asthma   . Carpal  tunnel syndrome, bilateral   . Carpal tunnel syndrome, bilateral   . Chronic kidney disease   . Complication of anesthesia    slow to wake up  . Coronary artery disease     2 v CAD with CTO of the RCA and high grade bifurcational LCx/OM stenosis. S/P PCI DES x 2 to the LCx/OM.  Marland Kitchen Diabetic peripheral neuropathy ([place]) "since 1996"  . GERD (gastroesophageal reflux disease)   . Goiter   . Headache    migraines prior to menopause  . History of shingles 06/01/2013  . Hyperlipidemia   . Hyperlipidemia LDL goal <70 10/13/2015  . Hypertension   . Hyperthyroidism   . PAF (paroxysmal atrial fibrillation) ([place]) 04/29/2015   CHADS2VASC score of 4 now on Apixaban  . Paroxysmal atrial fibrillation with rapid ventricular response ([place]) 11/15/2015  . Pneumonia ~ 1976  . Tremors of nervous system   . Type II diabetes mellitus (HCC)    insulin dependent    Family History  Problem Relation Age of Onset  . Cancer Mother 26    bronchial cancer  . Breast cancer Mother   . Lung cancer Mother   . Hypertension Father   . COPD Father   . Heart disease Father 51    CAD with cardiac stenting  . Heart attack Father   . Parkinson's disease Father   . Allergies Sister   . Breast cancer Maternal Grandmother   . Emphysema Maternal Grandfather   . Leukemia Paternal Grandmother   . Emphysema Paternal Grandfather   . Thyroid disease Neg Hx     Past Surgical History:  Procedure Laterality Date  . ABDOMINAL HYSTERECTOMY  1988   age 59; CERVICAL DYSPLASIA; ovaries intact.   . AMPUTATION Right 01/23/2016   Procedure: Right 3rd Ray Amputation;  Surgeon: [name], MD;  Location: [place];  Service: Orthopedics;  Laterality: Right;  . AMPUTATION Right 02/13/2016   Procedure: Right Transmetatarsal Amputation;  Surgeon: [name], MD;  Location: [place];  Service: Orthopedics;  Laterality: Right;  . CARDIAC CATHETERIZATION N/A 02/27/2015   Procedure: Left Heart Cath and Coronary Angiography;  Surgeon: [name], MD; LAD 40%, mCFX 80%, OM 70%, RCA 100% calcified       . CARDIAC CATHETERIZATION N/A 03/06/2015   Procedure: Coronary Stent Intervention;  Surgeon: [name], MD;  Location: [place] [facility];  Service: Cardiovascular;  Laterality: N/A;  Mid CX 3.50x12 promus DES w/ 0% resdual and Prox OM1 2.50x20 promus DES w/ 20% residual  . CARPAL TUNNEL RELEASE Right Nov 2015  . CARPAL TUNNEL RELEASE Right 1992; 05/2014   [name]; [place]  . CESAREAN SECTION  1982; 1984  . FOOT NEUROMA SURGERY Bilateral 2000  . KNEE ARTHROSCOPY Right ~ 2003   "meniscus repair"  . SHOULDER OPEN ROTATOR CUFF REPAIR Right 1996; 1998   "w/fracture repair"  . THYROID SURGERY  2000   "removed lots of nodules"  . TONSILLECTOMY  1976   Social History   Occupational History  . Food Therapist, nutritional    Social History Main Topics  . Smoking status: Former Smoker    Packs/day: 0.00    Years: 41.00    Types: Cigarettes    Quit date: 03/05/2015  . Smokeless tobacco: Never Used     Comment: 04/29/2015 "quit smoking cigarettes 02/27/2015"  . Alcohol use No  . Drug use: No  . Sexual activity: Not Currently    Birth control/ protection: Post-menopausal, Surgical

## 2016-05-07 ENCOUNTER — Other Ambulatory Visit: Payer: Self-pay | Admitting: Physician Assistant

## 2016-05-09 ENCOUNTER — Encounter (HOSPITAL_COMMUNITY): Payer: Self-pay | Admitting: Emergency Medicine

## 2016-05-09 ENCOUNTER — Inpatient Hospital Stay (HOSPITAL_COMMUNITY)
Admission: EM | Admit: 2016-05-09 | Discharge: 2016-05-12 | DRG: 390 | Disposition: A | Discharge status: Home / Self Care | Payer: 59 | Attending: Family Medicine | Admitting: Family Medicine

## 2016-05-09 DIAGNOSIS — E785 Hyperlipidemia, unspecified: Secondary | ICD-10-CM | POA: Diagnosis present

## 2016-05-09 DIAGNOSIS — M1990 Unspecified osteoarthritis, unspecified site: Secondary | ICD-10-CM | POA: Diagnosis present

## 2016-05-09 DIAGNOSIS — Z888 Allergy status to other drugs, medicaments and biological substances status: Secondary | ICD-10-CM

## 2016-05-09 DIAGNOSIS — I2510 Atherosclerotic heart disease of native coronary artery without angina pectoris: Secondary | ICD-10-CM | POA: Diagnosis present

## 2016-05-09 DIAGNOSIS — K219 Gastro-esophageal reflux disease without esophagitis: Secondary | ICD-10-CM | POA: Diagnosis present

## 2016-05-09 DIAGNOSIS — Z7901 Long term (current) use of anticoagulants: Secondary | ICD-10-CM

## 2016-05-09 DIAGNOSIS — Z79899 Other long term (current) drug therapy: Secondary | ICD-10-CM

## 2016-05-09 DIAGNOSIS — Z89431 Acquired absence of right foot: Secondary | ICD-10-CM

## 2016-05-09 DIAGNOSIS — E119 Type 2 diabetes mellitus without complications: Secondary | ICD-10-CM | POA: Diagnosis present

## 2016-05-09 DIAGNOSIS — E0590 Thyrotoxicosis, unspecified without thyrotoxic crisis or storm: Secondary | ICD-10-CM | POA: Diagnosis present

## 2016-05-09 DIAGNOSIS — K56609 Unspecified intestinal obstruction, unspecified as to partial versus complete obstruction: Secondary | ICD-10-CM | POA: Diagnosis present

## 2016-05-09 DIAGNOSIS — Z885 Allergy status to narcotic agent status: Secondary | ICD-10-CM

## 2016-05-09 DIAGNOSIS — I4819 Other persistent atrial fibrillation: Secondary | ICD-10-CM | POA: Diagnosis present

## 2016-05-09 DIAGNOSIS — Z0189 Encounter for other specified special examinations: Secondary | ICD-10-CM

## 2016-05-09 DIAGNOSIS — Z4659 Encounter for fitting and adjustment of other gastrointestinal appliance and device: Secondary | ICD-10-CM

## 2016-05-09 DIAGNOSIS — Z87891 Personal history of nicotine dependence: Secondary | ICD-10-CM

## 2016-05-09 DIAGNOSIS — Z8619 Personal history of other infectious and parasitic diseases: Secondary | ICD-10-CM

## 2016-05-09 DIAGNOSIS — Z8249 Family history of ischemic heart disease and other diseases of the circulatory system: Secondary | ICD-10-CM

## 2016-05-09 DIAGNOSIS — Z794 Long term (current) use of insulin: Secondary | ICD-10-CM

## 2016-05-09 DIAGNOSIS — J45909 Unspecified asthma, uncomplicated: Secondary | ICD-10-CM | POA: Diagnosis present

## 2016-05-09 DIAGNOSIS — Z91048 Other nonmedicinal substance allergy status: Secondary | ICD-10-CM

## 2016-05-09 DIAGNOSIS — I10 Essential (primary) hypertension: Secondary | ICD-10-CM | POA: Diagnosis present

## 2016-05-09 DIAGNOSIS — Z880 Allergy status to penicillin: Secondary | ICD-10-CM

## 2016-05-09 DIAGNOSIS — E1151 Type 2 diabetes mellitus with diabetic peripheral angiopathy without gangrene: Secondary | ICD-10-CM

## 2016-05-09 LAB — CBC
HEMATOCRIT: 37.2 % (ref 36.0–46.0)
Hemoglobin: 12.4 g/dL (ref 12.0–15.0)
MCH: 27.1 pg (ref 26.0–34.0)
MCHC: 33.3 g/dL (ref 30.0–36.0)
MCV: 81.4 fL (ref 78.0–100.0)
PLATELETS: 380 10*3/uL (ref 150–400)
RBC: 4.57 MIL/uL (ref 3.87–5.11)
RDW: 13.9 % (ref 11.5–15.5)
WBC: 14.6 10*3/uL — ABNORMAL HIGH (ref 4.0–10.5)

## 2016-05-09 LAB — COMPREHENSIVE METABOLIC PANEL
ALBUMIN: 4.2 g/dL (ref 3.5–5.0)
ALK PHOS: 79 U/L (ref 38–126)
ALT: 15 U/L (ref 14–54)
AST: 20 U/L (ref 15–41)
Anion gap: 11 (ref 5–15)
BILIRUBIN TOTAL: 0.5 mg/dL (ref 0.3–1.2)
BUN: 26 mg/dL — AB (ref 6–20)
CALCIUM: 9.2 mg/dL (ref 8.9–10.3)
CO2: 25 mmol/L (ref 22–32)
CREATININE: 0.77 mg/dL (ref 0.44–1.00)
Chloride: 98 mmol/L — ABNORMAL LOW (ref 101–111)
GFR calc Af Amer: >60 mL/min (ref >60)
GLUCOSE: 272 mg/dL — AB (ref 65–99)
Potassium: 4.4 mmol/L (ref 3.5–5.1)
Sodium: 134 mmol/L — ABNORMAL LOW (ref 135–145)
TOTAL PROTEIN: 7 g/dL (ref 6.5–8.1)

## 2016-05-09 LAB — LIPASE, BLOOD: Lipase: 23 U/L (ref 11–51)

## 2016-05-09 NOTE — ED Triage Notes (Signed)
Pt reports having burning pain that began tonight. Pt also states feeling of nausea and needing to move her bowels. Pt reports taking gasx earlier with no relief.

## 2016-05-09 NOTE — ED Provider Notes (Signed)
[facility] DEPT Provider Note   CSN: [id number] Arrival date & time: 05/09/16  2004  By signing my name below, I, Jeanell Sparrow, attest that this documentation has been prepared under the direction and in the presence of non-physician practitioner, Alecia Lemming, PA-C. Electronically Signed: Jeanell Sparrow, Scribe. 05/09/2016. 11:50 PM.  History   Chief Complaint Chief Complaint  Patient presents with  . Abdominal Pain   The history is provided by the patient and medical records. No language interpreter was used.   HPI Comments: Judith [name] is a 65 y.o. female with history of hysterectomy -- who presents to the Emergency Department complaining of constant moderate left-sided abdominal pain that started about 8 hours ago. She states had a sudden onset of pain that gradually improved in the last hour. Per nurse's note, she took Gas-X PTA without relief. She describes the pain as a burning sensation. She had a similar severity of pain 4 years ago when she had an ovarian cyst. She reports associated symptoms of nausea, abdominal distension, and constipation. She is currently on Eliquis. She denies any dialysis, hx of hernia, hx of cholecystectomy, hx of appendectomy, fever, vomiting, diarrhea, blood in stool, or other complaints.     PCP: Reginia Forts, MD  Past Medical History:  Diagnosis Date  . Abnormal EKG 07/31/2013  . Arthritis    "hands" (03/06/2015)  . Asthma   . Carpal tunnel syndrome, bilateral   . Carpal tunnel syndrome, bilateral   . Chronic kidney disease   . Complication of anesthesia    slow to wake up  . Coronary artery disease     2 v CAD with CTO of the RCA and high grade bifurcational LCx/OM stenosis. S/P PCI DES x 2 to the LCx/OM.  Marland Kitchen Diabetic peripheral neuropathy ([place]) "since 1996"  . GERD (gastroesophageal reflux disease)   . Goiter   . Headache    migraines prior to menopause  . History of shingles 06/01/2013  . Hyperlipidemia   . Hyperlipidemia LDL goal  <70 10/13/2015  . Hypertension   . Hyperthyroidism   . PAF (paroxysmal atrial fibrillation) ([place]) 04/29/2015   CHADS2VASC score of 4 now on Apixaban  . Paroxysmal atrial fibrillation with rapid ventricular response ([place]) 11/15/2015  . Pneumonia ~ 1976  . Tremors of nervous system   . Type II diabetes mellitus ([facility])    insulin dependent    Patient Active Problem List   Diagnosis Date Noted  . S/P transmetatarsal amputation of foot, right ([place]) 04/19/2016  . Obstructive sleep apnea 11/26/2015  . Bilateral carpal tunnel syndrome 11/26/2015  . Hypomagnesemia 11/16/2015  . Hyperlipidemia LDL goal <70 10/13/2015  . Abnormality of gait 09/02/2015  . Memory loss 08/12/2015  . Diabetic peripheral neuropathy ([place]) 08/12/2015  . Vitamin D deficiency 08/12/2015  . Hyperthyroidism 04/30/2015  . Heme positive stool   . PAF (paroxysmal atrial fibrillation) ([place]) 04/29/2015  . Coronary artery disease involving native coronary artery of native heart without angina pectoris 03/29/2015  . Groin hematoma 2020-03-415  . Exertional angina ([place]) 03/06/2015  . Abnormal nuclear stress test   . Acute kidney injury ([place]) 02/12/2015  . Acute hyponatremia 02/12/2015  . History of goiter 09/28/2014  . GERD (gastroesophageal reflux disease) 07/31/2013  . Depression with anxiety 06/01/2013  . HTN (hypertension) 01/30/2013  . Diabetic neuropathy, painful ([place]) 01/30/2013  . Tobacco user 01/30/2013    Past Surgical History:  Procedure Laterality Date  . ABDOMINAL HYSTERECTOMY  1988   age 59; CERVICAL DYSPLASIA; ovaries intact.   Marland Kitchen  AMPUTATION Right 01/23/2016   Procedure: Right 3rd Ray Amputation;  Surgeon: [name], MD;  Location: [facility];  Service: Orthopedics;  Laterality: Right;  . AMPUTATION Right 02/13/2016   Procedure: Right Transmetatarsal Amputation;  Surgeon: [name], MD;  Location: [facility];  Service: Orthopedics;  Laterality: Right;  . CARDIAC CATHETERIZATION N/A 02/27/2015   Procedure: Left  Heart Cath and Coronary Angiography;  Surgeon: [name], MD; LAD 40%, mCFX 80%, OM 70%, RCA 100% calcified       . CARDIAC CATHETERIZATION N/A 03/06/2015   Procedure: Coronary Stent Intervention;  Surgeon: [name], MD;  Location: [facility];  Service: Cardiovascular;  Laterality: N/A;  Mid CX 3.50x12 promus DES w/ 0% resdual and Prox OM1 2.50x20 promus DES w/ 20% residual  . CARPAL TUNNEL RELEASE Right Nov 2015  . CARPAL TUNNEL RELEASE Right 1992; 05/2014   [name];   . CESAREAN SECTION  1982; 1984  . FOOT NEUROMA SURGERY Bilateral 2000  . KNEE ARTHROSCOPY Right ~ 2003   "meniscus repair"  . SHOULDER OPEN ROTATOR CUFF REPAIR Right 1996; 1998   "w/fracture repair"  . THYROID SURGERY  2000   "removed lots of nodules"  . TONSILLECTOMY  1976    OB History    No data available       Home Medications    Prior to Admission medications   Medication Sig Start Date End Date Taking? Authorizing Provider  albuterol (PROVENTIL HFA) 108 (90 Base) MCG/ACT inhaler Inhale 1-2 puffs into the lungs every 6 (six) hours as needed for wheezing or shortness of breath. 11/07/15   [name], MD  amLODipine (NORVASC) 5 MG tablet Take 1 tablet (5 mg total) by mouth daily. 11/18/15   Rhonda G Barrett, PA-C  apixaban (ELIQUIS) 5 MG TABS tablet Take 1 tablet (5 mg total) by mouth 2 (two) times daily. 05/07/16   Bhavinkumar Bhagat, PA  atorvastatin (LIPITOR) 20 MG tablet Take 1 tablet (20 mg total) by mouth daily. 04/22/15   Liliane Shi, PA-C  buPROPion (WELLBUTRIN SR) 150 MG 12 hr tablet Take 1 tablet (150 mg total) by mouth 2 (two) times daily. 02/05/16   [name], MD  clindamycin (CLEOCIN) 300 MG capsule Take 300 mg by mouth See admin instructions. Take 1 capsule (300 mg) one hour prior to dental appointment - last taken 02/05/16    Historical Provider, MD  clopidogrel (PLAVIX) 75 MG tablet TAKE ONE TABLET BY MOUTH ONCE DAILY 04/05/16   Sueanne Margarita, MD  collagenase  (SANTYL) ointment Apply 1 application topically daily. 04/19/16   Suzan Slick, NP  cyclobenzaprine (FLEXERIL) 5 MG tablet Take 5 mg by mouth at bedtime as needed for muscle spasms. 12/04/15   Historical Provider, MD  doxycycline (VIBRAMYCIN) 100 MG capsule Take 100 mg by mouth 2 (two) times daily. 30 day course filled 01/30/16 01/30/16   Historical Provider, MD  FLUoxetine (PROZAC) 20 MG capsule Take 1 capsule (20 mg total) by mouth at bedtime. 02/05/16   [name], MD  fluticasone (FLOVENT HFA) 44 MCG/ACT inhaler Inhale 2 puffs into the lungs 2 (two) times daily as needed (shortness of breath/ wheezing).     Historical Provider, MD  gabapentin (NEURONTIN) 300 MG capsule TAKE 3 CAPSULES BY MOUTH AT morning AND 3 CAPSULES AT BEDTIME Patient taking differently: Take 900 mg by mouth 2 (two) times daily. TAKE 3 CAPSULES BY MOUTH EVERY  MORNING AND 3 CAPSULES AT BEDTIME 02/05/16  [name], MD  HYDROcodone-acetaminophen (NORCO/VICODIN) 5-325 MG tablet Take 1-2 tablets by mouth every 6 (six) hours as needed for moderate pain. 02/13/16   Dalia Heading, PA-C  Insulin Glargine (BASAGLAR KWIKPEN) 100 UNIT/ML SOPN Inject 0.2 mLs (20 Units total) into the skin daily at 10 pm. 01/15/16   Renato Shin, MD  insulin lispro (HUMALOG) 100 UNIT/ML injection 3 times a day (just before each meal) 15-25-10 units 03/16/16   Renato Shin, MD  loratadine (CLARITIN) 10 MG tablet Take 10 mg by mouth daily as needed for allergies.    Historical Provider, MD  losartan (COZAAR) 50 MG tablet TAKE ONE TABLET BY MOUTH ONCE DAILY 04/05/16   Sueanne Margarita, MD  metFORMIN (GLUCOPHAGE-XR) 500 MG 24 hr tablet Take 4 tablets (2,000 mg total) by mouth daily with breakfast. 03/16/16   Renato Shin, MD  methimazole (TAPAZOLE) 10 MG tablet Take 1 tablet (10 mg total) by mouth daily. 01/15/16   Renato Shin, MD  metoprolol succinate (TOPROL-XL) 25 MG 24 hr tablet Take 1 tablet (25 mg total) by mouth 2 (two) times daily. Take with or  immediately following a meal. 11/18/15   Rhonda G Barrett, PA-C  nitroGLYCERIN (NITRODUR - DOSED IN MG/24 HR) 0.2 mg/hr patch Place 1 patch (0.2 mg total) onto the skin daily. Patch burns patient so she cuts it in half 04/19/16   Suzan Slick, NP  nitroGLYCERIN (NITROSTAT) 0.4 MG SL tablet Place 1 tablet (0.4 mg total) under the tongue every 5 (five) minutes as needed for chest pain. 02/25/15   Liliane Shi, PA-C  ondansetron (ZOFRAN) 4 MG tablet Take 1 tablet (4 mg total) by mouth every 8 (eight) hours as needed for nausea or vomiting. 04/13/15   [name], PA-C  pantoprazole (PROTONIX) 40 MG tablet Take 1 tablet (40 mg total) by mouth daily. 02/05/16   [name], MD  pantoprazole (PROTONIX) 40 MG tablet Take 1 tablet (40 mg total) by mouth daily. 05/07/16   Rhonda G Barrett, PA-C  silver sulfADIAZINE (SILVADENE) 1 % cream Apply 1 application topically daily. 03/24/16   [name], MD  sotalol (BETAPACE) 80 MG tablet Take 1 tablet (80 mg total) by mouth every 12 (twelve) hours. 11/18/15   Rhonda G Barrett, PA-C  traMADol (ULTRAM) 50 MG tablet Take 1 tablet (50 mg total) by mouth every 6 (six) hours as needed. Maximum dose= 8 tablets per day Patient taking differently: Take 50 mg by mouth every 6 (six) hours as needed (pain). Maximum dose= 8 tablets per day 02/13/16   [name], MD    Family History Family History  Problem Relation Age of Onset  . Cancer Mother 26    bronchial cancer  . Breast cancer Mother   . Lung cancer Mother   . Hypertension Father   . COPD Father   . Heart disease Father 85    CAD with cardiac stenting  . Heart attack Father   . Parkinson's disease Father   . Allergies Sister   . Breast cancer Maternal Grandmother   . Emphysema Maternal Grandfather   . Leukemia Paternal Grandmother   . Emphysema Paternal Grandfather   . Thyroid disease Neg Hx     Social History Social History  Substance Use Topics  . Smoking status: Former Smoker     Packs/day: 0.00    Years: 41.00    Types: Cigarettes    Quit date: 03/05/2015  . Smokeless tobacco: Never Used  Comment: 04/29/2015 "quit smoking cigarettes 02/27/2015"  . Alcohol use No     Allergies   Novolog [insulin aspart]; Codeine; Iodine; Penicillins; Propofol; Ace inhibitors; Demerol [meperidine]; Neosporin [neomycin-bacitracin zn-polymyx]; Percocet [oxycodone-acetaminophen]; and Tape   Review of Systems Review of Systems  Constitutional: Negative for fever.  HENT: Negative for rhinorrhea and sore throat.   Eyes: Negative for redness.  Respiratory: Negative for cough.   Cardiovascular: Negative for chest pain.  Gastrointestinal: Positive for abdominal distention, abdominal pain, constipation and nausea. Negative for blood in stool, diarrhea and vomiting.  Genitourinary: Negative for dysuria.  Musculoskeletal: Negative for myalgias.  Skin: Negative for rash.  Neurological: Negative for headaches.     Physical Exam Updated Vital Signs BP 183/93 (BP Location: Right Arm)   Pulse 80   Temp 98.2 F (36.8 C) (Oral)   Resp 17   Ht 5\' 4"  (1.626 m)   Wt 180 lb (81.6 kg)   SpO2 99%   BMI 30.90 kg/m   Physical Exam  Constitutional: She appears well-developed and well-nourished. No distress.  HENT:  Head: Normocephalic and atraumatic.  Mouth/Throat: Oropharynx is clear and moist.  Eyes: Conjunctivae are normal. Right eye exhibits no discharge. Left eye exhibits no discharge.  Neck: Normal range of motion. Neck supple.  Cardiovascular: Normal rate, regular rhythm and normal heart sounds.   Pulmonary/Chest: Effort normal and breath sounds normal. She has no wheezes.  Abdominal: Soft. She exhibits distension (Tympanic). She exhibits no mass. There is tenderness (Generalized, worse upper). There is no rebound and no guarding.  Musculoskeletal: Normal range of motion.  Neurological: She is alert.  Skin: Skin is warm and dry.  Psychiatric: She has a normal mood and  affect.  Nursing note and vitals reviewed.    ED Treatments / Results  DIAGNOSTIC STUDIES: Oxygen Saturation is 99% on RA, normal by my interpretation.    COORDINATION OF CARE: 11:55 PM- Pt advised of plan for treatment and pt agrees.  Labs (all labs ordered are listed, but only abnormal results are displayed) Labs Reviewed  COMPREHENSIVE METABOLIC PANEL - Abnormal; Notable for the following:       Result Value   Sodium 134 (*)    Chloride 98 (*)    Glucose, Bld 272 (*)    BUN 26 (*)    All other components within normal limits  CBC - Abnormal; Notable for the following:    WBC 14.6 (*)    All other components within normal limits  URINALYSIS, ROUTINE W REFLEX MICROSCOPIC - Abnormal; Notable for the following:    APPearance HAZY (*)    Glucose, UA 150 (*)    Ketones, ur 5 (*)    Protein, ur >=300 (*)    Leukocytes, UA TRACE (*)    Squamous Epithelial / LPF 0-5 (*)    All other components within normal limits  LIPASE, BLOOD    Radiology Ct Abdomen Pelvis Wo Contrast  Result Date: 05/10/2016 CLINICAL DATA:  Left-sided abdominal pain for several hours with nausea and distention EXAM: CT ABDOMEN AND PELVIS WITHOUT CONTRAST TECHNIQUE: Multidetector CT imaging of the abdomen and pelvis was performed following the standard protocol without IV contrast. COMPARISON:  04/13/2015 FINDINGS: Lower chest: Lung bases demonstrate no acute consolidation or pleural effusion. Mitral calcifications are again visualized. Aortic valvular calcifications. Heart size nonenlarged. No large pericardial effusion. Hepatobiliary: No focal liver abnormality is seen. No gallstones, gallbladder wall thickening, or biliary dilatation. Pancreas: Atrophic pancreas.  No surrounding inflammation. Spleen: Normal in size without  focal abnormality. Adrenals/Urinary Tract: Adrenal glands within normal limits. Kidneys show no hydronephrosis or focal calcification. There is lobulated contour of the kidneys as before. The  bladder is within normal limits. Stomach/Bowel: Stomach is not significantly enlarged. There are multiple loops of mildly dilated small bowel, measuring up to 3.6 cm in diameter within the central lower anterior abdomen, with suggestion of at least 1 transition point on sagittal images, series 5, image number 144 at the mid small bowel. There are decompressed loops of distal small bowel. Appendix is prominent in size but contains gas within its lumen and no surrounding inflammation. Moderate stool in the colon. Diverticular disease of the colon without acute inflammation or wall thickening. Probable diverticulum off the second portion of duodenum as before. Vascular/Lymphatic: Atherosclerotic vascular calcifications. No abnormally enlarged abdominal or pelvic lymph nodes. Reproductive: Status post hysterectomy. No adnexal masses. Other: No free air or free fluid. Musculoskeletal: No suspicious bone lesions. IMPRESSION: 1. Multiple loops of mildly dilated small bowel with evidence of decompressed distal small bowel, findings would be consistent with mild mechanical small bowel obstruction. This could be secondary to adhesions. 2. Sigmoid colon diverticulosis without evidence for acute inflammation. Electronically Signed   By: Donavan Foil M.D.   On: 05/10/2016 03:59   Dg Abd Portable 1 View  Result Date: 05/10/2016 CLINICAL DATA:  Nasogastric tube placement. EXAM: PORTABLE ABDOMEN - 1 VIEW COMPARISON:  CT abdomen and pelvis May 11, 2015 FINDINGS: Nasogastric tube tip and side-port project in proximal to mid stomach. Gas distended small bowel in mid abdomen measuring up to 4.8 cm. No intra-abdominal mass effect or pathologic calcifications. Soft tissue planes and included osseous structures are nonsuspicious. IMPRESSION: Nasogastric tube tip projects in proximal to mid stomach. Electronically Signed   By: Elon Alas M.D.   On: 05/10/2016 05:50   Dg Abd Portable 1v  Result Date: 05/10/2016 CLINICAL  DATA:  Nasogastric tube placement EXAM: PORTABLE ABDOMEN - 1 VIEW COMPARISON:  None FINDINGS: The nasogastric tube tip reaches the gastric lumen but the proximal port is at the EG junction. The tube should be advanced proximally 5-10 cm for optimal placement. Moderate small bowel dilatation and stacking is present. IMPRESSION: Recommend advancement of the nasogastric tube 5-10 cm for optimal placement. These results will be called to the ordering clinician or representative by the Radiologist Assistant, and communication documented in the PACS or zVision Dashboard. Electronically Signed   By: Andreas Newport M.D.   On: 05/10/2016 05:09    Procedures Procedures (including critical care time)  Medications Ordered in ED Medications  HYDROmorphone (DILAUDID) injection 0.5 mg (0.5 mg Intravenous Given 05/10/16 0054)  ondansetron (ZOFRAN) injection 4 mg (4 mg Intravenous Given 05/10/16 0054)  lidocaine (XYLOCAINE) 2 % viscous mouth solution (10 mLs  Given 05/10/16 0434)     Initial Impression / Assessment and Plan / ED Course  I have reviewed the triage vital signs and the nursing notes.  Pertinent labs & imaging results that were available during my care of the patient were reviewed by me and considered in my medical decision making (see chart for details).  Clinical Course    Patient seen and examined. Work-up initiated. Medications ordered. CT ordered.   Vital signs reviewed and are as follows: BP 179/92 (BP Location: Left Arm)   Pulse 81   Temp 98.2 F (36.8 C) (Oral)   Resp 20   Ht 5\' 4"  (1.626 m)   Wt 81.6 kg   SpO2 95%   BMI 30.90  kg/m   Pt updated on results. Pt discussed with Dr. Kathrynn Humble. NG tube ordered.   Spoke with Dr. Alcario Drought who will admit.    Final Clinical Impressions(s) / ED Diagnoses   Final diagnoses:  Small bowel obstruction   Admit.   New Prescriptions Current Discharge Medication List     I personally performed the services described in this  documentation, which was scribed in my presence. The recorded information has been reviewed and is accurate.      Carlisle Cater, PA-C 05/10/16 [place], MD 05/10/16 [phone]

## 2016-05-09 NOTE — ED Notes (Signed)
No respiratory or acute distress noted alert and oriented x 3 call light in reach. 

## 2016-05-10 ENCOUNTER — Emergency Department (HOSPITAL_COMMUNITY): Payer: 59

## 2016-05-10 ENCOUNTER — Inpatient Hospital Stay (HOSPITAL_COMMUNITY): Payer: 59

## 2016-05-10 DIAGNOSIS — K56609 Unspecified intestinal obstruction, unspecified as to partial versus complete obstruction: Secondary | ICD-10-CM | POA: Diagnosis present

## 2016-05-10 DIAGNOSIS — I10 Essential (primary) hypertension: Secondary | ICD-10-CM

## 2016-05-10 DIAGNOSIS — Z8619 Personal history of other infectious and parasitic diseases: Secondary | ICD-10-CM | POA: Diagnosis not present

## 2016-05-10 DIAGNOSIS — Z91048 Other nonmedicinal substance allergy status: Secondary | ICD-10-CM | POA: Diagnosis not present

## 2016-05-10 DIAGNOSIS — Z794 Long term (current) use of insulin: Secondary | ICD-10-CM

## 2016-05-10 DIAGNOSIS — E0590 Thyrotoxicosis, unspecified without thyrotoxic crisis or storm: Secondary | ICD-10-CM | POA: Diagnosis present

## 2016-05-10 DIAGNOSIS — I480 Paroxysmal atrial fibrillation: Secondary | ICD-10-CM

## 2016-05-10 DIAGNOSIS — Z880 Allergy status to penicillin: Secondary | ICD-10-CM | POA: Diagnosis not present

## 2016-05-10 DIAGNOSIS — Z888 Allergy status to other drugs, medicaments and biological substances status: Secondary | ICD-10-CM | POA: Diagnosis not present

## 2016-05-10 DIAGNOSIS — M1990 Unspecified osteoarthritis, unspecified site: Secondary | ICD-10-CM | POA: Diagnosis present

## 2016-05-10 DIAGNOSIS — Z87891 Personal history of nicotine dependence: Secondary | ICD-10-CM | POA: Diagnosis not present

## 2016-05-10 DIAGNOSIS — Z79899 Other long term (current) drug therapy: Secondary | ICD-10-CM | POA: Diagnosis not present

## 2016-05-10 DIAGNOSIS — Z89431 Acquired absence of right foot: Secondary | ICD-10-CM | POA: Diagnosis not present

## 2016-05-10 DIAGNOSIS — Z8249 Family history of ischemic heart disease and other diseases of the circulatory system: Secondary | ICD-10-CM | POA: Diagnosis not present

## 2016-05-10 DIAGNOSIS — E1101 Type 2 diabetes mellitus with hyperosmolarity with coma: Secondary | ICD-10-CM

## 2016-05-10 DIAGNOSIS — K219 Gastro-esophageal reflux disease without esophagitis: Secondary | ICD-10-CM | POA: Diagnosis present

## 2016-05-10 DIAGNOSIS — Z7901 Long term (current) use of anticoagulants: Secondary | ICD-10-CM | POA: Diagnosis not present

## 2016-05-10 DIAGNOSIS — Z885 Allergy status to narcotic agent status: Secondary | ICD-10-CM | POA: Diagnosis not present

## 2016-05-10 DIAGNOSIS — I2510 Atherosclerotic heart disease of native coronary artery without angina pectoris: Secondary | ICD-10-CM | POA: Diagnosis present

## 2016-05-10 DIAGNOSIS — J45909 Unspecified asthma, uncomplicated: Secondary | ICD-10-CM | POA: Diagnosis present

## 2016-05-10 DIAGNOSIS — E785 Hyperlipidemia, unspecified: Secondary | ICD-10-CM | POA: Diagnosis present

## 2016-05-10 DIAGNOSIS — E119 Type 2 diabetes mellitus without complications: Secondary | ICD-10-CM | POA: Diagnosis present

## 2016-05-10 DIAGNOSIS — R109 Unspecified abdominal pain: Secondary | ICD-10-CM | POA: Diagnosis present

## 2016-05-10 LAB — URINALYSIS, ROUTINE W REFLEX MICROSCOPIC
BACTERIA UA: NONE SEEN
BILIRUBIN URINE: NEGATIVE
Glucose, UA: 150 mg/dL — AB
HGB URINE DIPSTICK: NEGATIVE
KETONES UR: 5 mg/dL — AB
NITRITE: NEGATIVE
Protein, ur: >=300 mg/dL — AB
Specific Gravity, Urine: 1.03 (ref 1.005–1.030)
pH: 5 (ref 5.0–8.0)

## 2016-05-10 LAB — GLUCOSE, CAPILLARY
GLUCOSE-CAPILLARY: 235 mg/dL — AB (ref 65–99)
GLUCOSE-CAPILLARY: 214 mg/dL — AB (ref 65–99)
Glucose-Capillary: 278 mg/dL — ABNORMAL HIGH (ref 65–99)

## 2016-05-10 MED ORDER — NITROGLYCERIN 0.1 MG/HR TD PT24
0.1000 mg | MEDICATED_PATCH | Freq: Every day | TRANSDERMAL | Status: DC
  Administered 2016-05-10 – 2016-05-11 (×2): 0.1 mg via TRANSDERMAL
  Filled 2016-05-10 (×3): qty 1

## 2016-05-10 MED ORDER — PANTOPRAZOLE SODIUM 40 MG PO TBEC: 40.0000 mg | DELAYED_RELEASE_TABLET | Freq: Every day | ORAL | Status: DC

## 2016-05-10 MED ORDER — LIDOCAINE VISCOUS 2 % MT SOLN
OROMUCOSAL | Status: AC
  Administered 2016-05-10: 10 mL
  Filled 2016-05-10: qty 15

## 2016-05-10 MED ORDER — LOSARTAN POTASSIUM 50 MG PO TABS: 50.0000 mg | ORAL_TABLET | Freq: Every day | ORAL | Status: DC

## 2016-05-10 MED ORDER — INSULIN LISPRO 100 UNIT/ML (KWIKPEN)
0.0000 [IU] | PEN_INJECTOR | SUBCUTANEOUS | Status: DC
  Administered 2016-05-10 (×2): 3 [IU] via SUBCUTANEOUS
  Administered 2016-05-10 (×2): 5 [IU] via SUBCUTANEOUS
  Administered 2016-05-11: 2 [IU] via SUBCUTANEOUS
  Administered 2016-05-11 (×2): 3 [IU] via SUBCUTANEOUS
  Administered 2016-05-11: 2 [IU] via SUBCUTANEOUS
  Administered 2016-05-11: 3 [IU] via SUBCUTANEOUS
  Administered 2016-05-11: 2 [IU] via SUBCUTANEOUS
  Administered 2016-05-11: 3 [IU] via SUBCUTANEOUS
  Administered 2016-05-12: 1 [IU] via SUBCUTANEOUS
  Administered 2016-05-12: 2 [IU] via SUBCUTANEOUS
  Administered 2016-05-12: 3 [IU] via SUBCUTANEOUS
  Filled 2016-05-10: qty 3

## 2016-05-10 MED ORDER — GABAPENTIN 300 MG PO CAPS: 900.0000 mg | ORAL_CAPSULE | Freq: Two times a day (BID) | ORAL | Status: DC

## 2016-05-10 MED ORDER — INSULIN GLARGINE 100 UNIT/ML ~~LOC~~ SOLN
10.0000 [IU] | Freq: Every day | SUBCUTANEOUS | Status: DC
  Filled 2016-05-10: qty 0.1

## 2016-05-10 MED ORDER — AMLODIPINE BESYLATE 5 MG PO TABS: 5.0000 mg | ORAL_TABLET | Freq: Every day | ORAL | Status: DC

## 2016-05-10 MED ORDER — ALBUTEROL SULFATE (2.5 MG/3ML) 0.083% IN NEBU: 2.5000 mg | INHALATION_SOLUTION | Freq: Four times a day (QID) | RESPIRATORY_TRACT | Status: DC | PRN

## 2016-05-10 MED ORDER — PANTOPRAZOLE SODIUM 40 MG IV SOLR
40.0000 mg | INTRAVENOUS | Status: DC
  Administered 2016-05-10 – 2016-05-12 (×3): 40 mg via INTRAVENOUS
  Filled 2016-05-10 (×3): qty 40

## 2016-05-10 MED ORDER — METHIMAZOLE 10 MG PO TABS
10.0000 mg | ORAL_TABLET | Freq: Every day | ORAL | Status: DC
  Filled 2016-05-10: qty 1

## 2016-05-10 MED ORDER — LABETALOL HCL 5 MG/ML IV SOLN
10.0000 mg | Freq: Four times a day (QID) | INTRAVENOUS | Status: DC
  Administered 2016-05-10 – 2016-05-12 (×8): 10 mg via INTRAVENOUS
  Filled 2016-05-10 (×8): qty 4

## 2016-05-10 MED ORDER — FLUOXETINE HCL 20 MG PO CAPS: 20.0000 mg | ORAL_CAPSULE | Freq: Every day | ORAL | Status: DC

## 2016-05-10 MED ORDER — BUPROPION HCL ER (SR) 150 MG PO TB12: 150.0000 mg | ORAL_TABLET | Freq: Two times a day (BID) | ORAL | Status: DC

## 2016-05-10 MED ORDER — HYDRALAZINE HCL 20 MG/ML IJ SOLN
10.0000 mg | INTRAMUSCULAR | Status: DC | PRN
  Administered 2016-05-10 – 2016-05-11 (×2): 10 mg via INTRAVENOUS
  Filled 2016-05-10 (×3): qty 1

## 2016-05-10 MED ORDER — HYDROMORPHONE HCL 1 MG/ML IJ SOLN
0.5000 mg | INTRAMUSCULAR | Status: DC | PRN
  Administered 2016-05-10 – 2016-05-11 (×3): 0.5 mg via INTRAVENOUS
  Filled 2016-05-10 (×3): qty 1

## 2016-05-10 MED ORDER — HYDROMORPHONE HCL 1 MG/ML IJ SOLN
0.5000 mg | Freq: Once | INTRAMUSCULAR | Status: AC
  Administered 2016-05-10: 0.5 mg via INTRAVENOUS
  Filled 2016-05-10: qty 1

## 2016-05-10 MED ORDER — ATORVASTATIN CALCIUM 20 MG PO TABS
20.0000 mg | ORAL_TABLET | Freq: Every day | ORAL | Status: DC
  Filled 2016-05-10: qty 1

## 2016-05-10 MED ORDER — SOTALOL HCL 80 MG PO TABS
80.0000 mg | ORAL_TABLET | Freq: Two times a day (BID) | ORAL | Status: DC
  Filled 2016-05-10: qty 1

## 2016-05-10 MED ORDER — ALBUTEROL SULFATE HFA 108 (90 BASE) MCG/ACT IN AERS: 1.0000 | INHALATION_SPRAY | Freq: Four times a day (QID) | RESPIRATORY_TRACT | Status: DC | PRN

## 2016-05-10 MED ORDER — FLUTICASONE PROPIONATE HFA 44 MCG/ACT IN AERO: 2.0000 | INHALATION_SPRAY | Freq: Two times a day (BID) | RESPIRATORY_TRACT | Status: DC | PRN

## 2016-05-10 MED ORDER — SODIUM CHLORIDE 0.9 % IV SOLN
INTRAVENOUS | Status: DC
  Administered 2016-05-10 – 2016-05-11 (×4): via INTRAVENOUS

## 2016-05-10 MED ORDER — BUDESONIDE 0.25 MG/2ML IN SUSP: 0.2500 mg | Freq: Two times a day (BID) | RESPIRATORY_TRACT | Status: DC | PRN

## 2016-05-10 MED ORDER — CYCLOBENZAPRINE HCL 10 MG PO TABS: 5.0000 mg | ORAL_TABLET | Freq: Every evening | ORAL | Status: DC | PRN

## 2016-05-10 MED ORDER — APIXABAN 5 MG PO TABS: 5.0000 mg | ORAL_TABLET | Freq: Two times a day (BID) | ORAL | Status: DC

## 2016-05-10 MED ORDER — METOPROLOL SUCCINATE ER 25 MG PO TB24: 25.0000 mg | ORAL_TABLET | Freq: Two times a day (BID) | ORAL | Status: DC

## 2016-05-10 MED ORDER — CLOPIDOGREL BISULFATE 75 MG PO TABS: 75.0000 mg | ORAL_TABLET | Freq: Every day | ORAL | Status: DC

## 2016-05-10 MED ORDER — ONDANSETRON HCL 4 MG/2ML IJ SOLN
4.0000 mg | Freq: Once | INTRAMUSCULAR | Status: AC
  Administered 2016-05-10: 4 mg via INTRAVENOUS
  Filled 2016-05-10: qty 2

## 2016-05-10 MED ORDER — DIATRIZOATE MEGLUMINE & SODIUM 66-10 % PO SOLN
90.0000 mL | Freq: Once | ORAL | Status: AC
  Administered 2016-05-10: 90 mL via NASOGASTRIC
  Filled 2016-05-10: qty 90

## 2016-05-10 NOTE — Progress Notes (Signed)
Pharmacy - PO to IV conversion  Assessment:  4 yoF admitted for SBO. Currently NPO. Pharmacy requested to determine potential IV conversions while NPO  Recommendations:  Metoprolol (2.5:1 PO:IV ratio, divided q6h), Protonix, and Sotalol (1:1 ratio) may be converted to IV dose forms.   Reuel Boom, PharmD, BCPS Pager: [phone] 05/10/2016, 9:26 AM

## 2016-05-10 NOTE — H&P (Signed)
History and Physical    Judith [name] IS:[id number] DOB: 07/30/1950 DOA: 05/09/2016   PCP: Judith Forts, MD Chief Complaint:  Chief Complaint  Patient presents with  . Abdominal Pain    HPI: Judith [name] is a 66 y.o. female with medical history significant of DM, HTN, CAD, A.Fib.  Patient presents to the ED with c/o moderate, left sided abdominal pain.  Symptoms onset 8 hours ago.  Sudden onset of pain that gradually improved over past hour.  Gas-x provided no relief.  Pain is a burning sensation.  Last BM was 2 days ago.  Surgical history on abdomen includes 2 C-sections and hysterectomy.  ED Course: CT reveals SBO and suggests adhesions as cause.  Review of Systems: As per HPI otherwise 10 point review of systems negative.    Past Medical History:  Diagnosis Date  . Abnormal EKG 07/31/2013  . Arthritis    "hands" (03/06/2015)  . Asthma   . Carpal tunnel syndrome, bilateral   . Carpal tunnel syndrome, bilateral   . Chronic kidney disease   . Complication of anesthesia    slow to wake up  . Coronary artery disease     2 v CAD with CTO of the RCA and high grade bifurcational LCx/OM stenosis. S/P PCI DES x 2 to the LCx/OM.  Marland Kitchen Diabetic peripheral neuropathy ([place]) "since 1996"  . GERD (gastroesophageal reflux disease)   . Goiter   . Headache    migraines prior to menopause  . History of shingles 06/01/2013  . Hyperlipidemia   . Hyperlipidemia LDL goal <70 10/13/2015  . Hypertension   . Hyperthyroidism   . PAF (paroxysmal atrial fibrillation) ([place]) 04/29/2015   CHADS2VASC score of 4 now on Apixaban  . Paroxysmal atrial fibrillation with rapid ventricular response ([place]) 11/15/2015  . Pneumonia ~ 1976  . Tremors of nervous system   . Type II diabetes mellitus (HCC)    insulin dependent    Past Surgical History:  Procedure Laterality Date  . ABDOMINAL HYSTERECTOMY  1988   age 4; CERVICAL DYSPLASIA; ovaries intact.   . AMPUTATION Right 01/23/2016   Procedure: Right 3rd Ray  Amputation;  Surgeon: [name], MD;  Location: [facility];  Service: Orthopedics;  Laterality: Right;  . AMPUTATION Right 02/13/2016   Procedure: Right Transmetatarsal Amputation;  Surgeon: [name], MD;  Location: [facility];  Service: Orthopedics;  Laterality: Right;  . CARDIAC CATHETERIZATION N/A 02/27/2015   Procedure: Left Heart Cath and Coronary Angiography;  Surgeon: [name], MD; LAD 40%, mCFX 80%, OM 70%, RCA 100% calcified       . CARDIAC CATHETERIZATION N/A 03/06/2015   Procedure: Coronary Stent Intervention;  Surgeon: [name], MD;  Location: [facility];  Service: Cardiovascular;  Laterality: N/A;  Mid CX 3.50x12 promus DES w/ 0% resdual and Prox OM1 2.50x20 promus DES w/ 20% residual  . CARPAL TUNNEL RELEASE Right Nov 2015  . CARPAL TUNNEL RELEASE Right 1992; 05/2014   [name]; [place]  . CESAREAN SECTION  1982; 1984  . FOOT NEUROMA SURGERY Bilateral 2000  . KNEE ARTHROSCOPY Right ~ 2003   "meniscus repair"  . SHOULDER OPEN ROTATOR CUFF REPAIR Right 1996; 1998   "w/fracture repair"  . THYROID SURGERY  2000   "removed lots of nodules"  . TONSILLECTOMY  1976     reports that she quit smoking about 14 months ago. Her smoking use included Cigarettes. She smoked 0.00 packs per day for 41.00 years. She has never used smokeless  tobacco. She reports that she does not drink alcohol or use drugs.  Allergies  Allergen Reactions  . Novolog [Insulin Aspart] Shortness Of Breath    "breathing problems"  . Codeine Nausea And Vomiting    HIGH DOSES-SEVERE VOMITING  . Iodine Other (See Comments)    MUST HAVE BENADRYL PRIOR TO PROCEDURE AND RIGHT BEFORE TREATMENT TO COUNTERACT REACTION-BLISTERING REACTION DERMATOLOGICAL  . Penicillins Itching, Rash and Other (See Comments)    Has patient had a PCN reaction causing immediate rash, facial/tongue/throat swelling, SOB or lightheadedness with hypotension: Yes Has patient had a PCN reaction causing severe rash involving  mucus membranes or skin necrosis: No Has patient had a PCN reaction that required hospitalization No Has patient had a PCN reaction occurring within the last 10 years: No If all of the above answers are "NO", then may proceed with Cephalosporin use.  CHEST SIZED RASH AND ITCHING   . Propofol Other (See Comments)    "Breathing problems - asthma attack"  . Ace Inhibitors Cough  . Demerol [Meperidine] Nausea And Vomiting  . Neosporin [Neomycin-Bacitracin Zn-Polymyx] Itching and Rash    MAKES REACTIONS WORSE WHEN USING AS PROPHYLACTIC  . Percocet [Oxycodone-Acetaminophen] Rash  . Tape Itching and Rash    Family History  Problem Relation Age of Onset  . Cancer Mother 41    bronchial cancer  . Breast cancer Mother   . Lung cancer Mother   . Hypertension Father   . COPD Father   . Heart disease Father 27    CAD with cardiac stenting  . Heart attack Father   . Parkinson's disease Father   . Allergies Sister   . Breast cancer Maternal Grandmother   . Emphysema Maternal Grandfather   . Leukemia Paternal Grandmother   . Emphysema Paternal Grandfather   . Thyroid disease Neg Hx       Prior to Admission medications   Medication Sig Start Date End Date Taking? Authorizing Provider  albuterol (PROVENTIL HFA) 108 (90 Base) MCG/ACT inhaler Inhale 1-2 puffs into the lungs every 6 (six) hours as needed for wheezing or shortness of breath. 11/07/15  Yes [name], MD  amLODipine (NORVASC) 5 MG tablet Take 1 tablet (5 mg total) by mouth daily. 11/18/15  Yes Rhonda G Barrett, PA-C  apixaban (ELIQUIS) 5 MG TABS tablet Take 1 tablet (5 mg total) by mouth 2 (two) times daily. 05/07/16  Yes Bhavinkumar Bhagat, PA  atorvastatin (LIPITOR) 20 MG tablet Take 1 tablet (20 mg total) by mouth daily. 04/22/15  Yes Scott T Kathlen Mody, PA-C  buPROPion (WELLBUTRIN SR) 150 MG 12 hr tablet Take 1 tablet (150 mg total) by mouth 2 (two) times daily. 02/05/16  Yes [name], MD  clindamycin (CLEOCIN) 300 MG  capsule Take 300 mg by mouth See admin instructions. Take 1 capsule (300 mg) one hour prior to dental appointment - last taken 02/05/16   Yes Historical Provider, MD  clopidogrel (PLAVIX) 75 MG tablet TAKE ONE TABLET BY MOUTH ONCE DAILY Patient taking differently: TAKE 75 MG BY MOUTH ONCE DAILY 04/05/16  Yes Sueanne Margarita, MD  cyclobenzaprine (FLEXERIL) 5 MG tablet Take 5 mg by mouth at bedtime as needed for muscle spasms. 12/04/15  Yes Historical Provider, MD  FLUoxetine (PROZAC) 20 MG capsule Take 1 capsule (20 mg total) by mouth at bedtime. 02/05/16  Yes [name], MD  fluticasone (FLOVENT HFA) 44 MCG/ACT inhaler Inhale 2 puffs into the lungs 2 (two) times daily as needed (shortness  of breath/ wheezing).    Yes Historical Provider, MD  gabapentin (NEURONTIN) 300 MG capsule TAKE 3 CAPSULES BY MOUTH AT morning AND 3 CAPSULES AT BEDTIME Patient taking differently: Take 900 mg by mouth 2 (two) times daily. TAKE 3 CAPSULES BY MOUTH EVERY  MORNING AND 3 CAPSULES AT BEDTIME 02/05/16  Yes [name], MD  Insulin Glargine [facility] KWIKPEN) 100 UNIT/ML SOPN Inject 0.2 mLs (20 Units total) into the skin daily at 10 pm. 01/15/16  Yes Renato Shin, MD  insulin lispro (HUMALOG) 100 UNIT/ML injection 3 times a day (just before each meal) 15-25-10 units Patient taking differently: Inject into the skin 3 (three) times daily with meals. 3 times a day (just before each meal) 15 units EVERY MORNING -25 units EVERY AFTERNOON-10 units EVERY EVENING 03/16/16  Yes Renato Shin, MD  losartan (COZAAR) 50 MG tablet TAKE ONE TABLET BY MOUTH ONCE DAILY Patient taking differently: TAKE 50 MG BY MOUTH ONCE DAILY 04/05/16  Yes Sueanne Margarita, MD  metFORMIN (GLUCOPHAGE-XR) 500 MG 24 hr tablet Take 4 tablets (2,000 mg total) by mouth daily with breakfast. Patient taking differently: Take 500 mg by mouth 2 (two) times daily.  03/16/16  Yes Renato Shin, MD  methimazole (TAPAZOLE) 10 MG tablet Take 1 tablet (10 mg total) by mouth  daily. 01/15/16  Yes Renato Shin, MD  metoprolol succinate (TOPROL-XL) 25 MG 24 hr tablet Take 1 tablet (25 mg total) by mouth 2 (two) times daily. Take with or immediately following a meal. 11/18/15  Yes Rhonda G Barrett, PA-C  nitroGLYCERIN (NITRODUR - DOSED IN MG/24 HR) 0.2 mg/hr patch Place 1 patch (0.2 mg total) onto the skin daily. Patch burns patient so she cuts it in half 04/19/16  Yes Suzan Slick, NP  nitroGLYCERIN (NITROSTAT) 0.4 MG SL tablet Place 1 tablet (0.4 mg total) under the tongue every 5 (five) minutes as needed for chest pain. 02/25/15  Yes Scott T Kathlen Mody, PA-C  ondansetron (ZOFRAN) 4 MG tablet Take 1 tablet (4 mg total) by mouth every 8 (eight) hours as needed for nausea or vomiting. 04/13/15  Yes [name], PA-C  pantoprazole (PROTONIX) 40 MG tablet Take 1 tablet (40 mg total) by mouth daily. 05/07/16  Yes Rhonda G Barrett, PA-C  silver sulfADIAZINE (SILVADENE) 1 % cream Apply 1 application topically daily. 03/24/16  Yes [name], MD  sotalol (BETAPACE) 80 MG tablet Take 1 tablet (80 mg total) by mouth every 12 (twelve) hours. 11/18/15  Yes Rhonda G Barrett, PA-C  traMADol (ULTRAM) 50 MG tablet Take 1 tablet (50 mg total) by mouth every 6 (six) hours as needed. Maximum dose= 8 tablets per day Patient taking differently: Take 100 mg by mouth at bedtime. Maximum dose= 8 tablets per day 02/13/16  Yes [name], MD    Physical Exam: Vitals:   05/10/16 0411 05/10/16 0500 05/10/16 [id number] 05/10/16 0624  BP: 179/92 (!) 202/88  195/93  Pulse: 81 81  87  Resp: 20   20  Temp: 98.2 F (36.8 C)  98.1 F (36.7 C) 98.7 F (37.1 C)  TempSrc: Oral   Oral  SpO2: 95% 93% 94% 96%  Weight:      Height:          Constitutional: NAD, calm, comfortable Eyes: PERRL, lids and conjunctivae normal ENMT: Mucous membranes are moist. Posterior pharynx clear of any exudate or lesions.Normal dentition.  Neck: normal, supple, no masses, no thyromegaly Respiratory: clear to  auscultation bilaterally, no  wheezing, no crackles. Normal respiratory effort. No accessory muscle use.  Cardiovascular: Regular rate and rhythm, no murmurs / rubs / gallops. No extremity edema. 2+ pedal pulses. No carotid bruits.  Abdomen: no tenderness, no masses palpated. No hepatosplenomegaly. Bowel sounds positive.  Musculoskeletal: no clubbing / cyanosis. No joint deformity upper and lower extremities. Good ROM, no contractures. Normal muscle tone.  Skin: no rashes, lesions, ulcers. No induration Neurologic: CN 2-12 grossly intact. Sensation intact, DTR normal. Strength 5/5 in all 4.  Psychiatric: Normal judgment and insight. Alert and oriented x 3. Normal mood.    Labs on Admission: I have personally reviewed following labs and imaging studies  CBC:  Recent Labs Lab 05/09/16 2049  WBC 14.6*  HGB 12.4  HCT 37.2  MCV 81.4  PLT 123XX123   Basic Metabolic Panel:  Recent Labs Lab 05/09/16 2049  NA 134*  K 4.4  CL 98*  CO2 25  GLUCOSE 272*  BUN 26*  CREATININE 0.77  CALCIUM 9.2   GFR: Estimated Creatinine Clearance: 74.4 mL/min (by C-G formula based on SCr of 0.77 mg/dL). Liver Function Tests:  Recent Labs Lab 05/09/16 2049  AST 20  ALT 15  ALKPHOS 79  BILITOT 0.5  PROT 7.0  ALBUMIN 4.2    Recent Labs Lab 05/09/16 2049  LIPASE 23   No results for input(s): AMMONIA in the last 168 hours. Coagulation Profile: No results for input(s): INR, PROTIME in the last 168 hours. Cardiac Enzymes: No results for input(s): CKTOTAL, CKMB, CKMBINDEX, TROPONINI in the last 168 hours. BNP (last 3 results) No results for input(s): PROBNP in the last 8760 hours. HbA1C: No results for input(s): HGBA1C in the last 72 hours. CBG: No results for input(s): GLUCAP in the last 168 hours. Lipid Profile: No results for input(s): CHOL, HDL, LDLCALC, TRIG, CHOLHDL, LDLDIRECT in the last 72 hours. Thyroid Function Tests: No results for input(s): TSH, T4TOTAL, FREET4, T3FREE,  THYROIDAB in the last 72 hours. Anemia Panel: No results for input(s): VITAMINB12, FOLATE, FERRITIN, TIBC, IRON, RETICCTPCT in the last 72 hours. Urine analysis:    Component Value Date/Time   COLORURINE YELLOW 05/10/2016 0110   APPEARANCEUR HAZY (A) 05/10/2016 0110   LABSPEC 1.030 05/10/2016 0110   PHURINE 5.0 05/10/2016 0110   GLUCOSEU 150 (A) 05/10/2016 0110   HGBUR NEGATIVE 05/10/2016 0110   BILIRUBINUR NEGATIVE 05/10/2016 0110   BILIRUBINUR Small 12/06/2014 1441   KETONESUR 5 (A) 05/10/2016 0110   PROTEINUR >=300 (A) 05/10/2016 0110   UROBILINOGEN 1.0 02/12/2015 1238   NITRITE NEGATIVE 05/10/2016 0110   LEUKOCYTESUR TRACE (A) 05/10/2016 0110   Sepsis Labs: @LABRCNTIP (procalcitonin:4,lacticidven:4) )No results found for this or any previous visit (from the past 240 hour(s)).   Radiological Exams on Admission: Ct Abdomen Pelvis Wo Contrast  Result Date: 05/10/2016 CLINICAL DATA:  Left-sided abdominal pain for several hours with nausea and distention EXAM: CT ABDOMEN AND PELVIS WITHOUT CONTRAST TECHNIQUE: Multidetector CT imaging of the abdomen and pelvis was performed following the standard protocol without IV contrast. COMPARISON:  04/13/2015 FINDINGS: Lower chest: Lung bases demonstrate no acute consolidation or pleural effusion. Mitral calcifications are again visualized. Aortic valvular calcifications. Heart size nonenlarged. No large pericardial effusion. Hepatobiliary: No focal liver abnormality is seen. No gallstones, gallbladder wall thickening, or biliary dilatation. Pancreas: Atrophic pancreas.  No surrounding inflammation. Spleen: Normal in size without focal abnormality. Adrenals/Urinary Tract: Adrenal glands within normal limits. Kidneys show no hydronephrosis or focal calcification. There is lobulated contour of the kidneys as before. The  bladder is within normal limits. Stomach/Bowel: Stomach is not significantly enlarged. There are multiple loops of mildly dilated small  bowel, measuring up to 3.6 cm in diameter within the central lower anterior abdomen, with suggestion of at least 1 transition point on sagittal images, series 5, image number 144 at the mid small bowel. There are decompressed loops of distal small bowel. Appendix is prominent in size but contains gas within its lumen and no surrounding inflammation. Moderate stool in the colon. Diverticular disease of the colon without acute inflammation or wall thickening. Probable diverticulum off the second portion of duodenum as before. Vascular/Lymphatic: Atherosclerotic vascular calcifications. No abnormally enlarged abdominal or pelvic lymph nodes. Reproductive: Status post hysterectomy. No adnexal masses. Other: No free air or free fluid. Musculoskeletal: No suspicious bone lesions. IMPRESSION: 1. Multiple loops of mildly dilated small bowel with evidence of decompressed distal small bowel, findings would be consistent with mild mechanical small bowel obstruction. This could be secondary to adhesions. 2. Sigmoid colon diverticulosis without evidence for acute inflammation. Electronically Signed   By: Donavan Foil M.D.   On: 05/10/2016 03:59   Dg Abd Portable 1 View  Result Date: 05/10/2016 CLINICAL DATA:  Nasogastric tube placement. EXAM: PORTABLE ABDOMEN - 1 VIEW COMPARISON:  CT abdomen and pelvis May 11, 2015 FINDINGS: Nasogastric tube tip and side-port project in proximal to mid stomach. Gas distended small bowel in mid abdomen measuring up to 4.8 cm. No intra-abdominal mass effect or pathologic calcifications. Soft tissue planes and included osseous structures are nonsuspicious. IMPRESSION: Nasogastric tube tip projects in proximal to mid stomach. Electronically Signed   By: Elon Alas M.D.   On: 05/10/2016 05:50   Dg Abd Portable 1v  Result Date: 05/10/2016 CLINICAL DATA:  Nasogastric tube placement EXAM: PORTABLE ABDOMEN - 1 VIEW COMPARISON:  None FINDINGS: The nasogastric tube tip reaches the gastric  lumen but the proximal port is at the EG junction. The tube should be advanced proximally 5-10 cm for optimal placement. Moderate small bowel dilatation and stacking is present. IMPRESSION: Recommend advancement of the nasogastric tube 5-10 cm for optimal placement. These results will be called to the ordering clinician or representative by the Radiologist Assistant, and communication documented in the PACS or zVision Dashboard. Electronically Signed   By: Andreas Newport M.D.   On: 05/10/2016 05:09    EKG: Independently reviewed.  Assessment/Plan Principal Problem:   SBO (small bowel obstruction) Active Problems:   DM2 (diabetes mellitus, type 2) (HCC)   HTN (hypertension)   PAF (paroxysmal atrial fibrillation) (HCC)   Hyperthyroidism    1. SBO - per CT scan, likely due to adhesions 1. NGT in place 2. NPO 3. IVF 4. Likely will want surgery consult. 2. DM2 - 1. Hold home meds 2. Lantus 5 units QHS 3. Sensitive scale SSI with Humulog (due to novolog allergy) Q4H 3. HTN - continue home meds 4. PAF - continue home meds, continue eliquis 5. Hyperthyroidism - continue tapazole   DVT prophylaxis: Lovenox Code Status: Full Family Communication: No family in room Consults called: None Admission status: Admit to inpatient   Etta Quill DO [facility] Pager [phone] from 7PM-7AM  If 7AM-7PM, please contact the day physician for the patient [email or web address] Password [facility]  05/10/2016, 6:56 AM

## 2016-05-10 NOTE — ED Notes (Signed)
No respiratory or acute distress noted alert and oriented x 3 call light in reach no reaction to medication noted awaiting CT scan.

## 2016-05-10 NOTE — Progress Notes (Signed)
RN received report from ED, Pt arrived unit, alert and oriented, able to communicate needs. Will continue with current plan of care.

## 2016-05-10 NOTE — ED Notes (Signed)
No respiratory or acute distress noted alert and oriented x 3 call light in reach no reaction to medication noted. States she feels better with pain medication given.

## 2016-05-10 NOTE — ED Notes (Signed)
No respiratory or acute distress noted alert and oriented x 3 call light in reach no reaction to medication noted. 

## 2016-05-10 NOTE — Consult Note (Signed)
Reason for Consult:abdominal pain, sbo Referring Physician:Dr Jurline Folger is an 66 y.o. female.  HPI: 21 yof who has multiple medical problems, prior history of hysterectomy, on eliquis and plavix presents with mostly upper abdominal pain since yesterday at 4 pm. Never had this before. Nothing was helping at home.  No fevers.  No flatus or bm since yesterday afternoon although before she states she passed a lot of flatus.  She was seen in er and diagnosed with sbo. Admitted with ng tube in place. Her abdomen is softer this am, still has some nausea, ng output is clear.  I was asked to see her for sbo.   Past Medical History:  Diagnosis Date  . Abnormal EKG 07/31/2013  . Arthritis    "hands" (03/06/2015)  . Asthma   . Carpal tunnel syndrome, bilateral   . Carpal tunnel syndrome, bilateral   . Chronic kidney disease   . Complication of anesthesia    slow to wake up  . Coronary artery disease     2 v CAD with CTO of the RCA and high grade bifurcational LCx/OM stenosis. S/P PCI DES x 2 to the LCx/OM.  Marland Kitchen Diabetic peripheral neuropathy ([place]) "since 1996"  . GERD (gastroesophageal reflux disease)   . Goiter   . Headache    migraines prior to menopause  . History of shingles 06/01/2013  . Hyperlipidemia   . Hyperlipidemia LDL goal <70 10/13/2015  . Hypertension   . Hyperthyroidism   . PAF (paroxysmal atrial fibrillation) ([facility]) 04/29/2015   CHADS2VASC score of 4 now on Apixaban  . Paroxysmal atrial fibrillation with rapid ventricular response ([place]) 11/15/2015  . Pneumonia ~ 1976  . Tremors of nervous system   . Type II diabetes mellitus ([facility])    insulin dependent    Past Surgical History:  Procedure Laterality Date  . ABDOMINAL HYSTERECTOMY  1988   age 17; CERVICAL DYSPLASIA; ovaries intact.   . AMPUTATION Right 01/23/2016   Procedure: Right 3rd Ray Amputation;  Surgeon: [name], MD;  Location: [facility];  Service: Orthopedics;  Laterality: Right;  . AMPUTATION Right  02/13/2016   Procedure: Right Transmetatarsal Amputation;  Surgeon: [name], MD;  Location: [facility];  Service: Orthopedics;  Laterality: Right;  . CARDIAC CATHETERIZATION N/A 02/27/2015   Procedure: Left Heart Cath and Coronary Angiography;  Surgeon: [name], MD; LAD 40%, mCFX 80%, OM 70%, RCA 100% calcified       . CARDIAC CATHETERIZATION N/A 03/06/2015   Procedure: Coronary Stent Intervention;  Surgeon: [name], MD;  Location: [facility];  Service: Cardiovascular;  Laterality: N/A;  Mid CX 3.50x12 promus DES w/ 0% resdual and Prox OM1 2.50x20 promus DES w/ 20% residual  . CARPAL TUNNEL RELEASE Right Nov 2015  . CARPAL TUNNEL RELEASE Right 1992; 05/2014   [name]; [place]  . CESAREAN SECTION  1982; 1984  . FOOT NEUROMA SURGERY Bilateral 2000  . KNEE ARTHROSCOPY Right ~ 2003   "meniscus repair"  . SHOULDER OPEN ROTATOR CUFF REPAIR Right 1996; 1998   "w/fracture repair"  . THYROID SURGERY  2000   "removed lots of nodules"  . TONSILLECTOMY  1976    Family History  Problem Relation Age of Onset  . Cancer Mother 28    bronchial cancer  . Breast cancer Mother   . Lung cancer Mother   . Hypertension Father   . COPD Father   . Heart disease Father 71    CAD with  cardiac stenting  . Heart attack Father   . Parkinson's disease Father   . Allergies Sister   . Breast cancer Maternal Grandmother   . Emphysema Maternal Grandfather   . Leukemia Paternal Grandmother   . Emphysema Paternal Grandfather   . Thyroid disease Neg Hx     Social History:  reports that she quit smoking about 14 months ago. Her smoking use included Cigarettes. She smoked 0.00 packs per day for 41.00 years. She has never used smokeless tobacco. She reports that she does not drink alcohol or use drugs.  Allergies:  Allergies  Allergen Reactions  . Novolog [Insulin Aspart] Shortness Of Breath    "breathing problems"  . Codeine Nausea And Vomiting    HIGH DOSES-SEVERE VOMITING  . Iodine  Other (See Comments)    MUST HAVE BENADRYL PRIOR TO PROCEDURE AND RIGHT BEFORE TREATMENT TO COUNTERACT REACTION-BLISTERING REACTION DERMATOLOGICAL  . Penicillins Itching, Rash and Other (See Comments)    Has patient had a PCN reaction causing immediate rash, facial/tongue/throat swelling, SOB or lightheadedness with hypotension: Yes Has patient had a PCN reaction causing severe rash involving mucus membranes or skin necrosis: No Has patient had a PCN reaction that required hospitalization No Has patient had a PCN reaction occurring within the last 10 years: No If all of the above answers are "NO", then may proceed with Cephalosporin use.  CHEST SIZED RASH AND ITCHING   . Propofol Other (See Comments)    "Breathing problems - asthma attack"  . Ace Inhibitors Cough  . Demerol [Meperidine] Nausea And Vomiting  . Neosporin [Neomycin-Bacitracin Zn-Polymyx] Itching and Rash    MAKES REACTIONS WORSE WHEN USING AS PROPHYLACTIC  . Percocet [Oxycodone-Acetaminophen] Rash  . Tape Itching and Rash    Medications: I have reviewed the patient's current medications.  Results for orders placed or performed during the hospital encounter of 05/09/16 (from the past 48 hour(s))  Lipase, blood     Status: None   Collection Time: 05/09/16  8:49 PM  Result Value Ref Range   Lipase 23 11 - 51 U/L  Comprehensive metabolic panel     Status: Abnormal   Collection Time: 05/09/16  8:49 PM  Result Value Ref Range   Sodium 134 (L) 135 - 145 mmol/L   Potassium 4.4 3.5 - 5.1 mmol/L   Chloride 98 (L) 101 - 111 mmol/L   CO2 25 22 - 32 mmol/L   Glucose, Bld 272 (H) 65 - 99 mg/dL   BUN 26 (H) 6 - 20 mg/dL   Creatinine, Ser 0.77 0.44 - 1.00 mg/dL   Calcium 9.2 8.9 - 10.3 mg/dL   Total Protein 7.0 6.5 - 8.1 g/dL   Albumin 4.2 3.5 - 5.0 g/dL   AST 20 15 - 41 U/L   ALT 15 14 - 54 U/L   Alkaline Phosphatase 79 38 - 126 U/L   Total Bilirubin 0.5 0.3 - 1.2 mg/dL   GFR calc non Af Amer >60 >60 mL/min   GFR calc Af  Amer >60 >60 mL/min    Comment: (NOTE) The eGFR has been calculated using the CKD EPI equation. This calculation has not been validated in all clinical situations. eGFR's persistently <60 mL/min signify possible Chronic Kidney Disease.    Anion gap 11 5 - 15  CBC     Status: Abnormal   Collection Time: 05/09/16  8:49 PM  Result Value Ref Range   WBC 14.6 (H) 4.0 - 10.5 K/uL   RBC 4.57 3.87 -  5.11 MIL/uL   Hemoglobin 12.4 12.0 - 15.0 g/dL   HCT 37.2 36.0 - 46.0 %   MCV 81.4 78.0 - 100.0 fL   MCH 27.1 26.0 - 34.0 pg   MCHC 33.3 30.0 - 36.0 g/dL   RDW 13.9 11.5 - 15.5 %   Platelets 380 150 - 400 K/uL  Urinalysis, Routine w reflex microscopic     Status: Abnormal   Collection Time: 05/10/16  1:10 AM  Result Value Ref Range   Color, Urine YELLOW YELLOW   APPearance HAZY (A) CLEAR   Specific Gravity, Urine 1.030 1.005 - 1.030   pH 5.0 5.0 - 8.0   Glucose, UA 150 (A) NEGATIVE mg/dL   Hgb urine dipstick NEGATIVE NEGATIVE   Bilirubin Urine NEGATIVE NEGATIVE   Ketones, ur 5 (A) NEGATIVE mg/dL   Protein, ur >=300 (A) NEGATIVE mg/dL   Nitrite NEGATIVE NEGATIVE   Leukocytes, UA TRACE (A) NEGATIVE   RBC / HPF 0-5 0 - 5 RBC/hpf   WBC, UA 6-30 0 - 5 WBC/hpf   Bacteria, UA NONE SEEN NONE SEEN   Squamous Epithelial / LPF 0-5 (A) NONE SEEN   Hyaline Casts, UA PRESENT   Glucose, capillary     Status: Abnormal   Collection Time: 05/10/16  8:48 AM  Result Value Ref Range   Glucose-Capillary 278 (H) 65 - 99 mg/dL    Ct Abdomen Pelvis Wo Contrast  Result Date: 05/10/2016 CLINICAL DATA:  Left-sided abdominal pain for several hours with nausea and distention EXAM: CT ABDOMEN AND PELVIS WITHOUT CONTRAST TECHNIQUE: Multidetector CT imaging of the abdomen and pelvis was performed following the standard protocol without IV contrast. COMPARISON:  04/13/2015 FINDINGS: Lower chest: Lung bases demonstrate no acute consolidation or pleural effusion. Mitral calcifications are again visualized. Aortic  valvular calcifications. Heart size nonenlarged. No large pericardial effusion. Hepatobiliary: No focal liver abnormality is seen. No gallstones, gallbladder wall thickening, or biliary dilatation. Pancreas: Atrophic pancreas.  No surrounding inflammation. Spleen: Normal in size without focal abnormality. Adrenals/Urinary Tract: Adrenal glands within normal limits. Kidneys show no hydronephrosis or focal calcification. There is lobulated contour of the kidneys as before. The bladder is within normal limits. Stomach/Bowel: Stomach is not significantly enlarged. There are multiple loops of mildly dilated small bowel, measuring up to 3.6 cm in diameter within the central lower anterior abdomen, with suggestion of at least 1 transition point on sagittal images, series 5, image number 144 at the mid small bowel. There are decompressed loops of distal small bowel. Appendix is prominent in size but contains gas within its lumen and no surrounding inflammation. Moderate stool in the colon. Diverticular disease of the colon without acute inflammation or wall thickening. Probable diverticulum off the second portion of duodenum as before. Vascular/Lymphatic: Atherosclerotic vascular calcifications. No abnormally enlarged abdominal or pelvic lymph nodes. Reproductive: Status post hysterectomy. No adnexal masses. Other: No free air or free fluid. Musculoskeletal: No suspicious bone lesions. IMPRESSION: 1. Multiple loops of mildly dilated small bowel with evidence of decompressed distal small bowel, findings would be consistent with mild mechanical small bowel obstruction. This could be secondary to adhesions. 2. Sigmoid colon diverticulosis without evidence for acute inflammation. Electronically Signed   By: Donavan Foil M.D.   On: 05/10/2016 03:59   Dg Abd Portable 1 View  Result Date: 05/10/2016 CLINICAL DATA:  Nasogastric tube placement. EXAM: PORTABLE ABDOMEN - 1 VIEW COMPARISON:  CT abdomen and pelvis May 11, 2015  FINDINGS: Nasogastric tube tip and side-port project in proximal  to mid stomach. Gas distended small bowel in mid abdomen measuring up to 4.8 cm. No intra-abdominal mass effect or pathologic calcifications. Soft tissue planes and included osseous structures are nonsuspicious. IMPRESSION: Nasogastric tube tip projects in proximal to mid stomach. Electronically Signed   By: Elon Alas M.D.   On: 05/10/2016 05:50   Dg Abd Portable 1v  Result Date: 05/10/2016 CLINICAL DATA:  Nasogastric tube placement EXAM: PORTABLE ABDOMEN - 1 VIEW COMPARISON:  None FINDINGS: The nasogastric tube tip reaches the gastric lumen but the proximal port is at the EG junction. The tube should be advanced proximally 5-10 cm for optimal placement. Moderate small bowel dilatation and stacking is present. IMPRESSION: Recommend advancement of the nasogastric tube 5-10 cm for optimal placement. These results will be called to the ordering clinician or representative by the Radiologist Assistant, and communication documented in the PACS or zVision Dashboard. Electronically Signed   By: Andreas Newport M.D.   On: 05/10/2016 05:09    Review of Systems  Constitutional: Negative for chills and fever.  Respiratory: Negative for cough and shortness of breath.   Cardiovascular: Negative for chest pain.  Gastrointestinal: Positive for abdominal pain, constipation, nausea and vomiting. Negative for diarrhea.  Genitourinary: Negative for dysuria and urgency.  Neurological: Negative for loss of consciousness.  All other systems reviewed and are negative.  Blood pressure (!) 182/91, pulse 98, temperature 97.8 F (36.6 C), resp. rate 18, height _0  (1.626 m), weight 81.6 kg (180 lb), SpO2 100 %. Physical Exam  Vitals reviewed. Constitutional: She is oriented to person, place, and time. She appears well-developed and well-nourished.  HENT:  Head: Normocephalic and atraumatic.  Eyes: Pupils are equal, round, and reactive to light.  No scleral icterus.  Neck: Neck supple.  Cardiovascular: Normal rate and regular rhythm.   Respiratory: Effort normal and breath sounds normal.  GI: She exhibits distension. Bowel sounds are decreased. There is no tenderness (mild especially upper abdomen).  Musculoskeletal:  Right midfoot amp   Lymphadenopathy:    She has no cervical adenopathy.  Neurological: She is alert and oriented to person, place, and time.  Skin: Skin is warm and dry.    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 She has no indication currently to go to or. She feels better after ng tube placement but does not have any bowel function.  Would continue to hold eliquis and plavix in case she does need surgical intervention.  Will initiate sbo protocol today with gastrograffin and follow up films.  Hopefully this will resolve conservatively.  We discussed indications for surgery including inabilty to progress and have bowel function or worsening clinical condition.  Her daughter was present for discussion as well.   Judith [name] 05/10/2016, 11:46 AM

## 2016-05-10 NOTE — Progress Notes (Signed)
Patient was seen and examined at bedside. Admitted today morning, Please see H&P from Dr. Alcario Drought for detail.   Briefly, 66 year old female with history of diabetes, hypertension, coronary artery disease, atrial fibrillation presented with left-sided abdominal pain. Patient with history of 2 cesarean section and had hysterectomy in 1988. CT scan of abdomen consistent with a small bowel obstruction probably due to elevation in the setting of prior surgery. -NG tube was placed.  OE : BP 182/91, HR 91 Gen: NAD, NG tube placed.  Lungs: Bilaterally clear, no wheezing or crackle Cardiovascular: Regular rate rhythm, S1-S2 normal. No murmur appreciated GI: Soft, very sluggish bowel movement, epigastric tenderness. Not distended Lower extremity: No edema Alert, awake, following commands, nonfocal neurological exam  Plan: -Holding Plavix and Eliquis in case patient needs any surgical intervention. I consulted general surgery and discussed with Dr. Donne Hazel.  -Keep nothing by mouth. Holding oral medications. Discussed with the pharmacist. Started labetalol 10 mg IV every  6 hour for hypertensive urgency. Also added hydralazine as needed. -Discontinued Lantus since patient is nothing by mouth, cover hyperglycemia with sliding scale. -Protonix IV -Continue IV fluid -Follow-up general surgery's recommendation. May be able to resume home oral medication when patient can start eating.  Discussed with the patient, she reported that the cardiologist and was placed around November 2016 which is more than 1 year. Holding anticoagulation until we have planned from the surgery team.  DVT prophylaxis with SCD.

## 2016-05-10 NOTE — ED Notes (Signed)
No respiratory or acute distress noted alert and oriented x 3 no reaction to medication noted call light in reach. 

## 2016-05-10 NOTE — ED Notes (Signed)
Inserted NG tube to right naris pt tolerated well ordered port cxr for placement confirmation.

## 2016-05-11 DIAGNOSIS — K56609 Unspecified intestinal obstruction, unspecified as to partial versus complete obstruction: Principal | ICD-10-CM

## 2016-05-11 LAB — CBC
HCT: 36.1 % (ref 36.0–46.0)
HEMOGLOBIN: 11.9 g/dL — AB (ref 12.0–15.0)
MCH: 27.6 pg (ref 26.0–34.0)
MCHC: 33 g/dL (ref 30.0–36.0)
MCV: 83.8 fL (ref 78.0–100.0)
PLATELETS: 320 10*3/uL (ref 150–400)
RBC: 4.31 MIL/uL (ref 3.87–5.11)
RDW: 14.3 % (ref 11.5–15.5)
WBC: 7.3 10*3/uL (ref 4.0–10.5)

## 2016-05-11 LAB — BASIC METABOLIC PANEL
ANION GAP: 9 (ref 5–15)
BUN: 18 mg/dL (ref 6–20)
CALCIUM: 8.8 mg/dL — AB (ref 8.9–10.3)
CO2: 25 mmol/L (ref 22–32)
CREATININE: 0.59 mg/dL (ref 0.44–1.00)
Chloride: 103 mmol/L (ref 101–111)
Glucose, Bld: 238 mg/dL — ABNORMAL HIGH (ref 65–99)
Potassium: 3.5 mmol/L (ref 3.5–5.1)
SODIUM: 137 mmol/L (ref 135–145)

## 2016-05-11 LAB — GLUCOSE, CAPILLARY
GLUCOSE-CAPILLARY: 202 mg/dL — AB (ref 65–99)
GLUCOSE-CAPILLARY: 198 mg/dL — AB (ref 65–99)
GLUCOSE-CAPILLARY: 161 mg/dL — AB (ref 65–99)
Glucose-Capillary: 178 mg/dL — ABNORMAL HIGH (ref 65–99)
Glucose-Capillary: 218 mg/dL — ABNORMAL HIGH (ref 65–99)
Glucose-Capillary: 222 mg/dL — ABNORMAL HIGH (ref 65–99)
Glucose-Capillary: 237 mg/dL — ABNORMAL HIGH (ref 65–99)

## 2016-05-11 MED ORDER — DIPHENHYDRAMINE HCL 25 MG PO CAPS
25.0000 mg | ORAL_CAPSULE | Freq: Three times a day (TID) | ORAL | Status: DC | PRN
  Administered 2016-05-11: 25 mg via ORAL
  Filled 2016-05-11: qty 1

## 2016-05-11 MED ORDER — HYDROCODONE-ACETAMINOPHEN 5-325 MG PO TABS: 1.0000 | ORAL_TABLET | Freq: Four times a day (QID) | ORAL | Status: DC | PRN

## 2016-05-11 MED ORDER — INSULIN PEN NEEDLE 30G X 8 MM MISC
1.0000 | Status: DC | PRN
  Filled 2016-05-11: qty 1

## 2016-05-11 MED ORDER — TRAMADOL HCL 50 MG PO TABS
50.0000 mg | ORAL_TABLET | Freq: Four times a day (QID) | ORAL | Status: DC | PRN
  Administered 2016-05-11 – 2016-05-12 (×2): 50 mg via ORAL
  Filled 2016-05-11 (×2): qty 1

## 2016-05-11 MED ORDER — ONDANSETRON HCL 4 MG/2ML IJ SOLN
4.0000 mg | Freq: Four times a day (QID) | INTRAMUSCULAR | Status: DC | PRN
  Administered 2016-05-11: 4 mg via INTRAVENOUS
  Filled 2016-05-11: qty 2

## 2016-05-11 MED ORDER — PROCHLORPERAZINE EDISYLATE 5 MG/ML IJ SOLN
5.0000 mg | Freq: Once | INTRAMUSCULAR | Status: AC
  Administered 2016-05-11: 5 mg via INTRAVENOUS
  Filled 2016-05-11: qty 2

## 2016-05-11 NOTE — Progress Notes (Signed)
Subjective: Very upset and frustrated.  Says it was noisy all PM she could not sleep, Kind of gaging and spitting up around the NG, nothing in the NG cannister.  Pt reports large BM yesterday.  She wants the tube out.  Drainage in the NG is a little red.  Objective: Vital signs in last 24 hours: Temp:  [97.5 F (36.4 C)-97.9 F (36.6 C)] 97.8 F (36.6 C) (01/02 0418) Pulse Rate:  [91-98] 96 (01/02 0418) Resp:  [16-18] 18 (01/02 0418) BP: (161-192)/(69-100) 161/69 (01/02 0418) SpO2:  [99 %-100 %] 99 % (01/02 0418) Weight:  [81.6 kg (180 lb)] 81.6 kg (180 lb) (01/01 1245) Last BM Date: 06/08/16 NPO 2600 IV Urine 300 recorded BM x 1 Afebrile, VSS Glucose 238, labs OK otherwise SB protocol 8 hours film:  Contrast has migrated into the colon and rectum. Previously noted dilated loops of central small bowel are not visualized on the current exam. No pathologic calcifications. BM x 1  Intake/Output from previous day: 01/01 0701 - 01/02 0700 In: 2862.5 [I.V.:2562.5; NG/GT:300] Out: 550 [Urine:300; Emesis/NG output:250] Intake/Output this shift: No intake/output data recorded.  General appearance: alert, cooperative, no distress and miserable and unhappy on bedside comode.   Resp: clear to auscultation bilaterally GI: soft, few BS, + flatus and BM  Lab Results:   Recent Labs  05/09/16 2049 05/11/16 0531  WBC 14.6* 7.3  HGB 12.4 11.9*  HCT 37.2 36.1  PLT 380 320    BMET  Recent Labs  05/09/16 2049 05/11/16 0531  NA 134* 137  K 4.4 3.5  CL 98* 103  CO2 25 25  GLUCOSE 272* 238*  BUN 26* 18  CREATININE 0.77 0.59  CALCIUM 9.2 8.8*   PT/INR No results for input(s): LABPROT, INR in the last 72 hours.   Recent Labs Lab 05/09/16 2049  AST 20  ALT 15  ALKPHOS 79  BILITOT 0.5  PROT 7.0  ALBUMIN 4.2     Lipase     Component Value Date/Time   LIPASE 23 05/09/2016 2049     Studies/Results: Ct Abdomen Pelvis Wo Contrast  Result Date:  05/10/2016 CLINICAL DATA:  Left-sided abdominal pain for several hours with nausea and distention EXAM: CT ABDOMEN AND PELVIS WITHOUT CONTRAST TECHNIQUE: Multidetector CT imaging of the abdomen and pelvis was performed following the standard protocol without IV contrast. COMPARISON:  04/13/2015 FINDINGS: Lower chest: Lung bases demonstrate no acute consolidation or pleural effusion. Mitral calcifications are again visualized. Aortic valvular calcifications. Heart size nonenlarged. No large pericardial effusion. Hepatobiliary: No focal liver abnormality is seen. No gallstones, gallbladder wall thickening, or biliary dilatation. Pancreas: Atrophic pancreas.  No surrounding inflammation. Spleen: Normal in size without focal abnormality. Adrenals/Urinary Tract: Adrenal glands within normal limits. Kidneys show no hydronephrosis or focal calcification. There is lobulated contour of the kidneys as before. The bladder is within normal limits. Stomach/Bowel: Stomach is not significantly enlarged. There are multiple loops of mildly dilated small bowel, measuring up to 3.6 cm in diameter within the central lower anterior abdomen, with suggestion of at least 1 transition point on sagittal images, series 5, image number 144 at the mid small bowel. There are decompressed loops of distal small bowel. Appendix is prominent in size but contains gas within its lumen and no surrounding inflammation. Moderate stool in the colon. Diverticular disease of the colon without acute inflammation or wall thickening. Probable diverticulum off the second portion of duodenum as before. Vascular/Lymphatic: Atherosclerotic vascular calcifications. No abnormally enlarged abdominal or  pelvic lymph nodes. Reproductive: Status post hysterectomy. No adnexal masses. Other: No free air or free fluid. Musculoskeletal: No suspicious bone lesions. IMPRESSION: 1. Multiple loops of mildly dilated small bowel with evidence of decompressed distal small bowel,  findings would be consistent with mild mechanical small bowel obstruction. This could be secondary to adhesions. 2. Sigmoid colon diverticulosis without evidence for acute inflammation. Electronically Signed   By: Donavan Foil M.D.   On: 05/10/2016 03:59   Dg Abd Portable 1v-small Bowel Obstruction Protocol-initial, 8 Hr Delay  Result Date: 05/10/2016 CLINICAL DATA:  Small bowel obstruction protocol abdominal distension with pain EXAM: PORTABLE ABDOMEN - 1 VIEW COMPARISON:  05/10/2016 FINDINGS: Esophageal tube tip repositioned and projects over the proximal stomach. Contrast has migrated into the colon and rectum. Previously noted dilated loops of central small bowel are not visualized on the current exam. No pathologic calcifications. IMPRESSION: Contrast has migrated into the colon and rectum. Previously noted dilated loops of small bowel in the central abdomen are not identified on the current exam. Electronically Signed   By: Donavan Foil M.D.   On: 05/10/2016 22:21   Dg Abd Portable 1v-small Bowel Protocol-position Verification  Result Date: 05/10/2016 CLINICAL DATA:  NG tube placement. EXAM: PORTABLE ABDOMEN - 1 VIEW 11:52 a.m. COMPARISON:  05/10/2014 at 5:25 a.m. FINDINGS: NG tube tip is in the distal stomach. Dilated small bowel consistent with small bowel obstruction. IMPRESSION: NG tube tip in the distal stomach.  Small bowel obstruction pattern. Electronically Signed   By: Lorriane Shire M.D.   On: 05/10/2016 12:27   Dg Abd Portable 1 View  Result Date: 05/10/2016 CLINICAL DATA:  Nasogastric tube placement. EXAM: PORTABLE ABDOMEN - 1 VIEW COMPARISON:  CT abdomen and pelvis May 11, 2015 FINDINGS: Nasogastric tube tip and side-port project in proximal to mid stomach. Gas distended small bowel in mid abdomen measuring up to 4.8 cm. No intra-abdominal mass effect or pathologic calcifications. Soft tissue planes and included osseous structures are nonsuspicious. IMPRESSION: Nasogastric tube tip  projects in proximal to mid stomach. Electronically Signed   By: Elon Alas M.D.   On: 05/10/2016 05:50   Dg Abd Portable 1v  Result Date: 05/10/2016 CLINICAL DATA:  Nasogastric tube placement EXAM: PORTABLE ABDOMEN - 1 VIEW COMPARISON:  None FINDINGS: The nasogastric tube tip reaches the gastric lumen but the proximal port is at the EG junction. The tube should be advanced proximally 5-10 cm for optimal placement. Moderate small bowel dilatation and stacking is present. IMPRESSION: Recommend advancement of the nasogastric tube 5-10 cm for optimal placement. These results will be called to the ordering clinician or representative by the Radiologist Assistant, and communication documented in the PACS or zVision Dashboard. Electronically Signed   By: Andreas Newport M.D.   On: 05/10/2016 05:09    Prior to Admission medications   Medication Sig Start Date End Date Taking? Authorizing Provider  albuterol (PROVENTIL HFA) 108 (90 Base) MCG/ACT inhaler Inhale 1-2 puffs into the lungs every 6 (six) hours as needed for wheezing or shortness of breath. 11/07/15  Yes [name], MD  amLODipine (NORVASC) 5 MG tablet Take 1 tablet (5 mg total) by mouth daily. 11/18/15  Yes Rhonda G Barrett, PA-C  apixaban (ELIQUIS) 5 MG TABS tablet Take 1 tablet (5 mg total) by mouth 2 (two) times daily. 05/07/16  Yes Bhavinkumar Bhagat, PA  atorvastatin (LIPITOR) 20 MG tablet Take 1 tablet (20 mg total) by mouth daily. 04/22/15  Yes Liliane Shi, PA-C  buPROPion (WELLBUTRIN SR) 150 MG 12 hr tablet Take 1 tablet (150 mg total) by mouth 2 (two) times daily. 02/05/16  Yes [name], MD  clindamycin (CLEOCIN) 300 MG capsule Take 300 mg by mouth See admin instructions. Take 1 capsule (300 mg) one hour prior to dental appointment - last taken 02/05/16   Yes Historical Provider, MD  clopidogrel (PLAVIX) 75 MG tablet TAKE ONE TABLET BY MOUTH ONCE DAILY Patient taking differently: TAKE 75 MG BY MOUTH ONCE DAILY 04/05/16   Yes Sueanne Margarita, MD  cyclobenzaprine (FLEXERIL) 5 MG tablet Take 5 mg by mouth at bedtime as needed for muscle spasms. 12/04/15  Yes Historical Provider, MD  FLUoxetine (PROZAC) 20 MG capsule Take 1 capsule (20 mg total) by mouth at bedtime. 02/05/16  Yes [name], MD  fluticasone (FLOVENT HFA) 44 MCG/ACT inhaler Inhale 2 puffs into the lungs 2 (two) times daily as needed (shortness of breath/ wheezing).    Yes Historical Provider, MD  gabapentin (NEURONTIN) 300 MG capsule TAKE 3 CAPSULES BY MOUTH AT morning AND 3 CAPSULES AT BEDTIME Patient taking differently: Take 900 mg by mouth 2 (two) times daily. TAKE 3 CAPSULES BY MOUTH EVERY  MORNING AND 3 CAPSULES AT BEDTIME 02/05/16  Yes [name], MD  Insulin Glargine [facility] KWIKPEN) 100 UNIT/ML SOPN Inject 0.2 mLs (20 Units total) into the skin daily at 10 pm. 01/15/16  Yes Renato Shin, MD  insulin lispro (HUMALOG) 100 UNIT/ML injection 3 times a day (just before each meal) 15-25-10 units Patient taking differently: Inject into the skin 3 (three) times daily with meals. 3 times a day (just before each meal) 15 units EVERY MORNING -25 units EVERY AFTERNOON-10 units EVERY EVENING 03/16/16  Yes Renato Shin, MD  losartan (COZAAR) 50 MG tablet TAKE ONE TABLET BY MOUTH ONCE DAILY Patient taking differently: TAKE 50 MG BY MOUTH ONCE DAILY 04/05/16  Yes Sueanne Margarita, MD  metFORMIN (GLUCOPHAGE-XR) 500 MG 24 hr tablet Take 4 tablets (2,000 mg total) by mouth daily with breakfast. Patient taking differently: Take 500 mg by mouth 2 (two) times daily.  03/16/16  Yes Renato Shin, MD  methimazole (TAPAZOLE) 10 MG tablet Take 1 tablet (10 mg total) by mouth daily. 01/15/16  Yes Renato Shin, MD  metoprolol succinate (TOPROL-XL) 25 MG 24 hr tablet Take 1 tablet (25 mg total) by mouth 2 (two) times daily. Take with or immediately following a meal. 11/18/15  Yes Rhonda G Barrett, PA-C  nitroGLYCERIN (NITRODUR - DOSED IN MG/24 HR) 0.2 mg/hr patch Place 1 patch  (0.2 mg total) onto the skin daily. Patch burns patient so she cuts it in half 04/19/16  Yes Suzan Slick, NP  nitroGLYCERIN (NITROSTAT) 0.4 MG SL tablet Place 1 tablet (0.4 mg total) under the tongue every 5 (five) minutes as needed for chest pain. 02/25/15  Yes Scott T Kathlen Mody, PA-C  ondansetron (ZOFRAN) 4 MG tablet Take 1 tablet (4 mg total) by mouth every 8 (eight) hours as needed for nausea or vomiting. 04/13/15  Yes [name], PA-C  pantoprazole (PROTONIX) 40 MG tablet Take 1 tablet (40 mg total) by mouth daily. 05/07/16  Yes Rhonda G Barrett, PA-C  silver sulfADIAZINE (SILVADENE) 1 % cream Apply 1 application topically daily. 03/24/16  Yes [name], MD  sotalol (BETAPACE) 80 MG tablet Take 1 tablet (80 mg total) by mouth every 12 (twelve) hours. 11/18/15  Yes Rhonda G Barrett, PA-C  traMADol (ULTRAM) 50 MG tablet Take  1 tablet (50 mg total) by mouth every 6 (six) hours as needed. Maximum dose= 8 tablets per day Patient taking differently: Take 100 mg by mouth at bedtime. Maximum dose= 8 tablets per day 02/13/16  Yes [name], MD    Medications: . insulin lispro  0-9 Units Subcutaneous Q4H  . labetalol  10 mg Intravenous Q6H  . nitroGLYCERIN  0.1 mg Transdermal Daily  . pantoprazole (PROTONIX) IV  40 mg Intravenous Q24H   . sodium chloride 125 mL/hr at 05/10/16 1738    Assessment/Plan SBO with hx of prior hysterectomy and C-section PAF on Eliquis AODM ID Hypertension HYperthyroid FEN:  IV fluids/NPO ID:  No abx DVT:  SCD's added/no anticoagulation   Plan: clamp NG Bariatric clears, hope to pull NG later  LOS: 1 day    Judith [name] 05/11/2016 [phone]

## 2016-05-11 NOTE — Progress Notes (Signed)
Inpatient Diabetes Program Recommendations  AACE/ADA: New Consensus Statement on Inpatient Glycemic Control (2015)  Target Ranges:  Prepandial:   less than 140 mg/dL      Peak postprandial:   less than 180 mg/dL (1-2 hours)      Critically ill patients:  140 - 180 mg/dL   Lab Results  Component Value Date   GLUCAP 198 (H) 05/11/2016   HGBA1C 5.8 03/16/2016    Review of Glycemic Control  CBGs in 200s.  Add basal insulin.  Inpatient Diabetes Program Recommendations:    Consider addition of Lantus 8 units QHS  Will continue to follow. Thank you. Lorenda Peck, RD, LDN, CDE Inpatient Diabetes Coordinator [phone]

## 2016-05-11 NOTE — Progress Notes (Signed)
Patient's BP is 187/117. MD paged. 10mg  of hydralazine given. BP decreased to 143/76. MD aware. Will continue to monitor.

## 2016-05-11 NOTE — Progress Notes (Signed)
PROGRESS NOTE  Judith [name]  IS:[id number] DOB: 24-Dec-1950 DOA: 05/09/2016 PCP: Reginia Forts, MD   Brief Narrative: 66 year old female with history of diabetes, hypertension, coronary artery disease, atrial fibrillation presented with left-sided abdominal pain. Patient with history of 2 cesarean section and had hysterectomy in 1988. CT scan of abdomen consistent with a small bowel obstruction probably due to adhesions in the setting of prior surgery. NG tube was placed. with limited return to suction. Nausea has improved, and pt had BM overnight. Surgery is consulted.   Assessment & Plan: Small bowel obstruction: Had BM 1/1. - DC IVF - DC NGT and advance diet as tolerated - Per general surgery - Trransition back to po meds if tolerating diet  T2DM: Hold home meds - Sensitive scale SSI with Humulog (due to novolog allergy) Q4H  HTN  - continue home meds  PAF  - continue home meds, continue eliquis  Hyperthyroidism  - continue tapazole  DVT prophylaxis: SCD Code Status: Full Family Communication: None at bedside this AM Disposition Plan: [place] home in next 24-48 hours  Consultants:   General surgery  Procedures:   None  Antimicrobials:  None   Subjective: Pt reports improvement in abd pain and nausea. No emesis this morning. Didn't sleep. Had BM overnight.  Objective: Vitals:   05/10/16 1449 05/10/16 2107 05/11/16 0418 05/11/16 1341  BP: (!) 163/69 (!) 176/81 (!) 161/69 (!) 160/115  Pulse: 96 91 96 99  Resp: 16 18 18 18   Temp: 97.9 F (36.6 C) 97.5 F (36.4 C) 97.8 F (36.6 C) 97.5 F (36.4 C)  TempSrc: Oral Oral Oral Oral  SpO2: 99% 99% 99% 100%  Weight:      Height:        Intake/Output Summary (Last 24 hours) at 05/11/16 1531 Last data filed at 05/11/16 0800  Gross per 24 hour  Intake           2237.5 ml  Output                0 ml  Net           2237.5 ml   Filed Weights   05/09/16 2009 05/10/16 1245  Weight: 81.6 kg (180 lb) 81.6 kg  (180 lb)    Examination: General exam: 66 y.o. female in no distress Respiratory system: Non-labored breathing. Clear to auscultation bilaterally.  Cardiovascular system: Regular rate and rhythm. No murmur, rub, or gallop. No JVD, and no pedal edema. Gastrointestinal system: Abdomen soft, non-tender, non-distended, with normoactive bowel sounds. No organomegaly or masses felt. Central nervous system: Alert and oriented. No focal neurological deficits. Extremities: Warm, no deformities Skin: No rashes, lesions no ulcers Psychiatry: Judgement and insight appear normal. Mood & affect appropriate.   Data Reviewed: I have personally reviewed following labs and imaging studies  CBC:  Recent Labs Lab 05/09/16 2049 05/11/16 0531  WBC 14.6* 7.3  HGB 12.4 11.9*  HCT 37.2 36.1  MCV 81.4 83.8  PLT 380 [id number]   Basic Metabolic Panel:  Recent Labs Lab 05/09/16 2049 05/11/16 0531  NA 134* 137  K 4.4 3.5  CL 98* 103  CO2 25 25  GLUCOSE 272* 238*  BUN 26* 18  CREATININE 0.77 0.59  CALCIUM 9.2 8.8*   GFR: Estimated Creatinine Clearance: 74.4 mL/min (by C-G formula based on SCr of 0.59 mg/dL). Liver Function Tests:  Recent Labs Lab 05/09/16 2049  AST 20  ALT 15  ALKPHOS 79  BILITOT 0.5  PROT 7.0  ALBUMIN 4.2  Recent Labs Lab 05/09/16 2049  LIPASE 23   No results for input(s): AMMONIA in the last 168 hours. Coagulation Profile: No results for input(s): INR, PROTIME in the last 168 hours. Cardiac Enzymes: No results for input(s): CKTOTAL, CKMB, CKMBINDEX, TROPONINI in the last 168 hours. BNP (last 3 results) No results for input(s): PROBNP in the last 8760 hours. HbA1C: No results for input(s): HGBA1C in the last 72 hours. CBG:  Recent Labs Lab 05/10/16 2106 05/11/16 0048 05/11/16 0417 05/11/16 0740 05/11/16 1202  GLUCAP 214* 222* 218* 202* 198*   Lipid Profile: No results for input(s): CHOL, HDL, LDLCALC, TRIG, CHOLHDL, LDLDIRECT in the last 72  hours. Thyroid Function Tests: No results for input(s): TSH, T4TOTAL, FREET4, T3FREE, THYROIDAB in the last 72 hours. Anemia Panel: No results for input(s): VITAMINB12, FOLATE, FERRITIN, TIBC, IRON, RETICCTPCT in the last 72 hours. Urine analysis:    Component Value Date/Time   COLORURINE YELLOW 05/10/2016 0110   APPEARANCEUR HAZY (A) 05/10/2016 0110   LABSPEC 1.030 05/10/2016 0110   PHURINE 5.0 05/10/2016 0110   GLUCOSEU 150 (A) 05/10/2016 0110   HGBUR NEGATIVE 05/10/2016 0110   BILIRUBINUR NEGATIVE 05/10/2016 0110   BILIRUBINUR Small 12/06/2014 1441   KETONESUR 5 (A) 05/10/2016 0110   PROTEINUR >=300 (A) 05/10/2016 0110   UROBILINOGEN 1.0 02/12/2015 1238   NITRITE NEGATIVE 05/10/2016 0110   LEUKOCYTESUR TRACE (A) 05/10/2016 0110   Sepsis Labs: @LABRCNTIP (procalcitonin:4,lacticidven:4)  )No results found for this or any previous visit (from the past 240 hour(s)).   Radiology Studies: Ct Abdomen Pelvis Wo Contrast  Result Date: 05/10/2016 CLINICAL DATA:  Left-sided abdominal pain for several hours with nausea and distention EXAM: CT ABDOMEN AND PELVIS WITHOUT CONTRAST TECHNIQUE: Multidetector CT imaging of the abdomen and pelvis was performed following the standard protocol without IV contrast. COMPARISON:  04/13/2015 FINDINGS: Lower chest: Lung bases demonstrate no acute consolidation or pleural effusion. Mitral calcifications are again visualized. Aortic valvular calcifications. Heart size nonenlarged. No large pericardial effusion. Hepatobiliary: No focal liver abnormality is seen. No gallstones, gallbladder wall thickening, or biliary dilatation. Pancreas: Atrophic pancreas.  No surrounding inflammation. Spleen: Normal in size without focal abnormality. Adrenals/Urinary Tract: Adrenal glands within normal limits. Kidneys show no hydronephrosis or focal calcification. There is lobulated contour of the kidneys as before. The bladder is within normal limits. Stomach/Bowel: Stomach is  not significantly enlarged. There are multiple loops of mildly dilated small bowel, measuring up to 3.6 cm in diameter within the central lower anterior abdomen, with suggestion of at least 1 transition point on sagittal images, series 5, image number 144 at the mid small bowel. There are decompressed loops of distal small bowel. Appendix is prominent in size but contains gas within its lumen and no surrounding inflammation. Moderate stool in the colon. Diverticular disease of the colon without acute inflammation or wall thickening. Probable diverticulum off the second portion of duodenum as before. Vascular/Lymphatic: Atherosclerotic vascular calcifications. No abnormally enlarged abdominal or pelvic lymph nodes. Reproductive: Status post hysterectomy. No adnexal masses. Other: No free air or free fluid. Musculoskeletal: No suspicious bone lesions. IMPRESSION: 1. Multiple loops of mildly dilated small bowel with evidence of decompressed distal small bowel, findings would be consistent with mild mechanical small bowel obstruction. This could be secondary to adhesions. 2. Sigmoid colon diverticulosis without evidence for acute inflammation. Electronically Signed   By: Donavan Foil M.D.   On: 05/10/2016 03:59   Dg Abd Portable 1v-small Bowel Obstruction Protocol-initial, 8 Hr Delay  Result Date: 05/10/2016  CLINICAL DATA:  Small bowel obstruction protocol abdominal distension with pain EXAM: PORTABLE ABDOMEN - 1 VIEW COMPARISON:  05/10/2016 FINDINGS: Esophageal tube tip repositioned and projects over the proximal stomach. Contrast has migrated into the colon and rectum. Previously noted dilated loops of central small bowel are not visualized on the current exam. No pathologic calcifications. IMPRESSION: Contrast has migrated into the colon and rectum. Previously noted dilated loops of small bowel in the central abdomen are not identified on the current exam. Electronically Signed   By: Donavan Foil M.D.   On:  05/10/2016 22:21   Dg Abd Portable 1v-small Bowel Protocol-position Verification  Result Date: 05/10/2016 CLINICAL DATA:  NG tube placement. EXAM: PORTABLE ABDOMEN - 1 VIEW 11:52 a.m. COMPARISON:  05/10/2014 at 5:25 a.m. FINDINGS: NG tube tip is in the distal stomach. Dilated small bowel consistent with small bowel obstruction. IMPRESSION: NG tube tip in the distal stomach.  Small bowel obstruction pattern. Electronically Signed   By: Lorriane Shire M.D.   On: 05/10/2016 12:27   Dg Abd Portable 1 View  Result Date: 05/10/2016 CLINICAL DATA:  Nasogastric tube placement. EXAM: PORTABLE ABDOMEN - 1 VIEW COMPARISON:  CT abdomen and pelvis May 11, 2015 FINDINGS: Nasogastric tube tip and side-port project in proximal to mid stomach. Gas distended small bowel in mid abdomen measuring up to 4.8 cm. No intra-abdominal mass effect or pathologic calcifications. Soft tissue planes and included osseous structures are nonsuspicious. IMPRESSION: Nasogastric tube tip projects in proximal to mid stomach. Electronically Signed   By: Elon Alas M.D.   On: 05/10/2016 05:50   Dg Abd Portable 1v  Result Date: 05/10/2016 CLINICAL DATA:  Nasogastric tube placement EXAM: PORTABLE ABDOMEN - 1 VIEW COMPARISON:  None FINDINGS: The nasogastric tube tip reaches the gastric lumen but the proximal port is at the EG junction. The tube should be advanced proximally 5-10 cm for optimal placement. Moderate small bowel dilatation and stacking is present. IMPRESSION: Recommend advancement of the nasogastric tube 5-10 cm for optimal placement. These results will be called to the ordering clinician or representative by the Radiologist Assistant, and communication documented in the PACS or zVision Dashboard. Electronically Signed   By: Andreas Newport M.D.   On: 05/10/2016 05:09    Scheduled Meds: . insulin lispro  0-9 Units Subcutaneous Q4H  . labetalol  10 mg Intravenous Q6H  . nitroGLYCERIN  0.1 mg Transdermal Daily  .  pantoprazole (PROTONIX) IV  40 mg Intravenous Q24H   Continuous Infusions: . sodium chloride 125 mL/hr at 05/11/16 1332     LOS: 1 day   Time spent: 25 minutes.  Vance Gather, MD [facility] Pager [phone]  If 7PM-7AM, please contact night-coverage [email or web address] Password [email or web address] 05/11/2016, 3:31 PM

## 2016-05-12 LAB — GLUCOSE, CAPILLARY
GLUCOSE-CAPILLARY: 147 mg/dL — AB (ref 65–99)
Glucose-Capillary: 177 mg/dL — ABNORMAL HIGH (ref 65–99)
Glucose-Capillary: 240 mg/dL — ABNORMAL HIGH (ref 65–99)

## 2016-05-12 MED ORDER — APIXABAN 5 MG PO TABS
5.0000 mg | ORAL_TABLET | Freq: Two times a day (BID) | ORAL | Status: DC
  Administered 2016-05-12: 5 mg via ORAL
  Filled 2016-05-12: qty 1

## 2016-05-12 MED ORDER — METHIMAZOLE 10 MG PO TABS
10.0000 mg | ORAL_TABLET | Freq: Every day | ORAL | Status: DC
  Administered 2016-05-12: 10 mg via ORAL
  Filled 2016-05-12: qty 1

## 2016-05-12 MED ORDER — PANTOPRAZOLE SODIUM 40 MG PO TBEC
40.0000 mg | DELAYED_RELEASE_TABLET | Freq: Every day | ORAL | Status: DC
  Administered 2016-05-12: 40 mg via ORAL
  Filled 2016-05-12: qty 1

## 2016-05-12 MED ORDER — ATORVASTATIN CALCIUM 10 MG PO TABS
20.0000 mg | ORAL_TABLET | Freq: Every day | ORAL | Status: DC
  Administered 2016-05-12: 20 mg via ORAL
  Filled 2016-05-12: qty 2

## 2016-05-12 MED ORDER — BUPROPION HCL ER (SR) 150 MG PO TB12
150.0000 mg | ORAL_TABLET | Freq: Two times a day (BID) | ORAL | Status: DC
  Administered 2016-05-12: 150 mg via ORAL
  Filled 2016-05-12: qty 1

## 2016-05-12 MED ORDER — FLUOXETINE HCL 20 MG PO CAPS: 20.0000 mg | ORAL_CAPSULE | Freq: Every day | ORAL | Status: DC

## 2016-05-12 MED ORDER — METOPROLOL SUCCINATE ER 50 MG PO TB24
50.0000 mg | ORAL_TABLET | Freq: Every day | ORAL | Status: DC
  Administered 2016-05-12: 50 mg via ORAL
  Filled 2016-05-12: qty 1

## 2016-05-12 MED ORDER — LOSARTAN POTASSIUM 50 MG PO TABS
50.0000 mg | ORAL_TABLET | Freq: Every day | ORAL | Status: DC
  Administered 2016-05-12: 50 mg via ORAL
  Filled 2016-05-12: qty 1

## 2016-05-12 MED ORDER — INSULIN GLARGINE 100 UNIT/ML ~~LOC~~ SOLN
8.0000 [IU] | Freq: Every day | SUBCUTANEOUS | Status: DC
  Filled 2016-05-12: qty 0.08

## 2016-05-12 MED ORDER — AMLODIPINE BESYLATE 5 MG PO TABS
5.0000 mg | ORAL_TABLET | Freq: Every day | ORAL | Status: DC
  Administered 2016-05-12: 5 mg via ORAL
  Filled 2016-05-12: qty 1

## 2016-05-12 MED ORDER — CLOPIDOGREL BISULFATE 75 MG PO TABS
75.0000 mg | ORAL_TABLET | Freq: Every day | ORAL | Status: DC
  Administered 2016-05-12: 75 mg via ORAL
  Filled 2016-05-12: qty 1

## 2016-05-12 MED ORDER — ONDANSETRON HCL 4 MG PO TABS: 4.0000 mg | ORAL_TABLET | Freq: Three times a day (TID) | ORAL | Status: DC | PRN

## 2016-05-12 NOTE — Progress Notes (Signed)
  Subjective: She looks good, taking some full liquids.  Had another BM and feels good  Objective: Vital signs in last 24 hours: Temp:  [97.5 F (36.4 C)-99.4 F (37.4 C)] 97.8 F (36.6 C) (01/03 0447) Pulse Rate:  [85-99] 85 (01/03 0447) Resp:  [18-20] 20 (01/03 0447) BP: (118-193)/(53-117) 193/80 (01/03 0533) SpO2:  [99 %-100 %] 99 % (01/03 0447) Last BM Date: 05/11/16 330 PO 2800 IV 1150 urine Afebrile,, VSS No labs  Intake/Output from previous day: 01/02 0701 - 01/03 0700 In: 3130 [P.O.:330; I.V.:2800] Out: 1150 [Urine:1150] Intake/Output this shift: No intake/output data recorded.  General appearance: alert, cooperative and no distress GI: soft, non-tender; bowel sounds normal; no masses,  no organomegaly  Lab Results:   Recent Labs  05/09/16 2049 05/11/16 0531  WBC 14.6* 7.3  HGB 12.4 11.9*  HCT 37.2 36.1  PLT 380 320    BMET  Recent Labs  05/09/16 2049 05/11/16 0531  NA 134* 137  K 4.4 3.5  CL 98* 103  CO2 25 25  GLUCOSE 272* 238*  BUN 26* 18  CREATININE 0.77 0.59  CALCIUM 9.2 8.8*   PT/INR No results for input(s): LABPROT, INR in the last 72 hours.   Recent Labs Lab 05/09/16 2049  AST 20  ALT 15  ALKPHOS 79  BILITOT 0.5  PROT 7.0  ALBUMIN 4.2     Lipase     Component Value Date/Time   LIPASE 23 05/09/2016 2049     Studies/Results: Dg Abd Portable 1v-small Bowel Obstruction Protocol-initial, 8 Hr Delay  Result Date: 05/10/2016 CLINICAL DATA:  Small bowel obstruction protocol abdominal distension with pain EXAM: PORTABLE ABDOMEN - 1 VIEW COMPARISON:  05/10/2016 FINDINGS: Esophageal tube tip repositioned and projects over the proximal stomach. Contrast has migrated into the colon and rectum. Previously noted dilated loops of central small bowel are not visualized on the current exam. No pathologic calcifications. IMPRESSION: Contrast has migrated into the colon and rectum. Previously noted dilated loops of small bowel in the  central abdomen are not identified on the current exam. Electronically Signed   By: Donavan Foil M.D.   On: 05/10/2016 22:21   Dg Abd Portable 1v-small Bowel Protocol-position Verification  Result Date: 05/10/2016 CLINICAL DATA:  NG tube placement. EXAM: PORTABLE ABDOMEN - 1 VIEW 11:52 a.m. COMPARISON:  05/10/2014 at 5:25 a.m. FINDINGS: NG tube tip is in the distal stomach. Dilated small bowel consistent with small bowel obstruction. IMPRESSION: NG tube tip in the distal stomach.  Small bowel obstruction pattern. Electronically Signed   By: Lorriane Shire M.D.   On: 05/10/2016 12:27    Medications: . amLODipine  5 mg Oral Daily  . insulin glargine  8 Units Subcutaneous Q2200  . insulin lispro  0-9 Units Subcutaneous Q4H  . losartan  50 mg Oral Daily  . metoprolol succinate  50 mg Oral Daily  . nitroGLYCERIN  0.1 mg Transdermal Daily  . pantoprazole (PROTONIX) IV  40 mg Intravenous Q24H    Assessment/Plan SBO with hx of prior hysterectomy and C-section PAF on Eliquis AODM ID Hypertension HYperthyroid FEN:  IV fluids/NPO ID:  No abx DVT:  SCD's added/no anticoagulation  Plan:  Advance diet, and she can go home when OK with Medicine.          LOS: 2 days    Arturo Freundlich 05/12/[id number]

## 2016-05-12 NOTE — Discharge Summary (Signed)
Physician Discharge Summary  Judith [name] [id number] DOB: 1950/07/24 DOA: 05/09/2016  PCP: Reginia Forts, MD  Admit date: 05/09/2016 Discharge date: 05/12/2016  Admitted From: Home Disposition: Home   Recommendations for Outpatient Follow-up:  1. Follow up with PCP in 1-2 weeks for hospital follow up following self-resolving partial SBO. 2. Per pharmacy: [facility]ier - drug interaction between prozac and plavix - reduces conversion of plavix to active form. Patient appears to have been on this combination for some time. Consider change to alternate SSRI if clinically necessary. No change was made as an inpatient.  Home Health: None Equipment/Devices: None Discharge Condition: Stable CODE STATUS: Full Diet recommendation: Soft > carbohydrate modified as tolerated.  Brief/Interim Summary: 66 year old female with history of diabetes, hypertension, coronary artery disease, atrial fibrillation presented with left-sided abdominal pain. Patient with history of 2 cesarean section and had hysterectomy in 1988. CT scan of abdomen consistent with a small bowel obstruction probably due to adhesions in the setting of prior surgery. NG tube was placed. with limited return to suction. Nausea has improved, and pt had BM overnight. Surgery was consulted and recommends no further follow up now that she has tolerated a soft diet and SBO has resolved.  Discharge Diagnoses:  Principal Problem:   SBO (small bowel obstruction) Active Problems:   DM2 (diabetes mellitus, type 2) (HCC)   HTN (hypertension)   PAF (paroxysmal atrial fibrillation) (HCC)   Hyperthyroidism  Small bowel obstruction: Had BM 1/1 and 1/2. - General surgery consulted, recommends no further follow up necessary as SBO is resolved. - Trransition back to po meds  T2DM: Held home meds - Sensitive scale SSI with Humolog (due to novolog allergy) Q4H  HTN  - continued home meds  PAF  - continued home meds, continue  eliquis  Hyperthyroidism  - continued tapazole  Discharge Instructions Discharge Instructions    Discharge instructions    Complete by:  As directed    You were admitted with a partial small bowel obstruction which has resolved on its own. There are no changes to medications or specialty follow ups recommended. If your symptoms return, please seek medical attention right away, otherwise follow up with Dr. Tamala Julian in the next week or two.     Allergies as of 05/12/2016      Reactions   Novolog [insulin Aspart] Shortness Of Breath   "breathing problems"   Codeine Nausea And Vomiting   HIGH DOSES-SEVERE VOMITING   Iodine Other (See Comments)   MUST HAVE BENADRYL PRIOR TO PROCEDURE AND RIGHT BEFORE TREATMENT TO COUNTERACT REACTION-BLISTERING REACTION DERMATOLOGICAL   Penicillins Itching, Rash, Other (See Comments)   Has patient had a PCN reaction causing immediate rash, facial/tongue/throat swelling, SOB or lightheadedness with hypotension: Yes Has patient had a PCN reaction causing severe rash involving mucus membranes or skin necrosis: No Has patient had a PCN reaction that required hospitalization No Has patient had a PCN reaction occurring within the last 10 years: No If all of the above answers are "NO", then may proceed with Cephalosporin use. CHEST SIZED RASH AND ITCHING   Propofol Other (See Comments)   "Breathing problems - asthma attack"   Ace Inhibitors Cough   Demerol [meperidine] Nausea And Vomiting   Neosporin [neomycin-bacitracin Zn-polymyx] Itching, Rash   MAKES REACTIONS WORSE WHEN USING AS PROPHYLACTIC   Percocet [oxycodone-acetaminophen] Rash   Tape Itching, Rash      Medication List    STOP taking these medications   collagenase ointment Commonly known as:  SANTYL  HYDROcodone-acetaminophen 5-325 MG tablet Commonly known as:  NORCO/VICODIN     TAKE these medications   albuterol 108 (90 Base) MCG/ACT inhaler Commonly known as:  PROVENTIL HFA Inhale 1-2  puffs into the lungs every 6 (six) hours as needed for wheezing or shortness of breath.   amLODipine 5 MG tablet Commonly known as:  NORVASC Take 1 tablet (5 mg total) by mouth daily.   apixaban 5 MG Tabs tablet Commonly known as:  ELIQUIS Take 1 tablet (5 mg total) by mouth 2 (two) times daily.   atorvastatin 20 MG tablet Commonly known as:  LIPITOR Take 1 tablet (20 mg total) by mouth daily.   BASAGLAR KWIKPEN 100 UNIT/ML Sopn Inject 0.2 mLs (20 Units total) into the skin daily at 10 pm.   buPROPion 150 MG 12 hr tablet Commonly known as:  WELLBUTRIN SR Take 1 tablet (150 mg total) by mouth 2 (two) times daily.   clindamycin 300 MG capsule Commonly known as:  CLEOCIN Take 300 mg by mouth See admin instructions. Take 1 capsule (300 mg) one hour prior to dental appointment - last taken 02/05/16   clopidogrel 75 MG tablet Commonly known as:  PLAVIX TAKE ONE TABLET BY MOUTH ONCE DAILY What changed:  See the new instructions.   cyclobenzaprine 5 MG tablet Commonly known as:  FLEXERIL Take 5 mg by mouth at bedtime as needed for muscle spasms.   FLUoxetine 20 MG capsule Commonly known as:  PROZAC Take 1 capsule (20 mg total) by mouth at bedtime.   fluticasone 44 MCG/ACT inhaler Commonly known as:  FLOVENT HFA Inhale 2 puffs into the lungs 2 (two) times daily as needed (shortness of breath/ wheezing).   gabapentin 300 MG capsule Commonly known as:  NEURONTIN TAKE 3 CAPSULES BY MOUTH AT morning AND 3 CAPSULES AT BEDTIME What changed:  how much to take  how to take this  when to take this  additional instructions   insulin lispro 100 UNIT/ML injection Commonly known as:  HUMALOG 3 times a day (just before each meal) 15-25-10 units What changed:  how to take this  when to take this  additional instructions   losartan 50 MG tablet Commonly known as:  COZAAR TAKE ONE TABLET BY MOUTH ONCE DAILY What changed:  See the new instructions.   metFORMIN 500 MG 24 hr  tablet Commonly known as:  GLUCOPHAGE-XR Take 4 tablets (2,000 mg total) by mouth daily with breakfast. What changed:  how much to take  when to take this   methimazole 10 MG tablet Commonly known as:  TAPAZOLE Take 1 tablet (10 mg total) by mouth daily.   metoprolol succinate 25 MG 24 hr tablet Commonly known as:  TOPROL-XL Take 1 tablet (25 mg total) by mouth 2 (two) times daily. Take with or immediately following a meal.   nitroGLYCERIN 0.4 MG SL tablet Commonly known as:  NITROSTAT Place 1 tablet (0.4 mg total) under the tongue every 5 (five) minutes as needed for chest pain.   nitroGLYCERIN 0.2 mg/hr patch Commonly known as:  NITRODUR - Dosed in mg/24 hr Place 1 patch (0.2 mg total) onto the skin daily. Patch burns patient so she cuts it in half   ondansetron 4 MG tablet Commonly known as:  ZOFRAN Take 1 tablet (4 mg total) by mouth every 8 (eight) hours as needed for nausea or vomiting.   pantoprazole 40 MG tablet Commonly known as:  PROTONIX Take 1 tablet (40 mg total) by mouth daily.  silver sulfADIAZINE 1 % cream Commonly known as:  SILVADENE Apply 1 application topically daily.   sotalol 80 MG tablet Commonly known as:  BETAPACE Take 1 tablet (80 mg total) by mouth every 12 (twelve) hours.   traMADol 50 MG tablet Commonly known as:  ULTRAM Take 1 tablet (50 mg total) by mouth every 6 (six) hours as needed. Maximum dose= 8 tablets per day What changed:  how much to take  when to take this  additional instructions      Follow-up Information    SMITH,KRISTI, MD. Schedule an appointment as soon as possible for a visit.   Specialty:  Family Medicine Contact information: [address] [place] [id number] [phone]          Allergies  Allergen Reactions  . Novolog [Insulin Aspart] Shortness Of Breath    "breathing problems"  . Codeine Nausea And Vomiting    HIGH DOSES-SEVERE VOMITING  . Iodine Other (See Comments)    MUST HAVE  BENADRYL PRIOR TO PROCEDURE AND RIGHT BEFORE TREATMENT TO COUNTERACT REACTION-BLISTERING REACTION DERMATOLOGICAL  . Penicillins Itching, Rash and Other (See Comments)    Has patient had a PCN reaction causing immediate rash, facial/tongue/throat swelling, SOB or lightheadedness with hypotension: Yes Has patient had a PCN reaction causing severe rash involving mucus membranes or skin necrosis: No Has patient had a PCN reaction that required hospitalization No Has patient had a PCN reaction occurring within the last 10 years: No If all of the above answers are "NO", then may proceed with Cephalosporin use.  CHEST SIZED RASH AND ITCHING   . Propofol Other (See Comments)    "Breathing problems - asthma attack"  . Ace Inhibitors Cough  . Demerol [Meperidine] Nausea And Vomiting  . Neosporin [Neomycin-Bacitracin Zn-Polymyx] Itching and Rash    MAKES REACTIONS WORSE WHEN USING AS PROPHYLACTIC  . Percocet [Oxycodone-Acetaminophen] Rash  . Tape Itching and Rash    Consultations:  General surgery, Dr. Marcello Moores  Procedures/Studies: Ct Abdomen Pelvis Wo Contrast  Result Date: 05/10/2016 CLINICAL DATA:  Left-sided abdominal pain for several hours with nausea and distention EXAM: CT ABDOMEN AND PELVIS WITHOUT CONTRAST TECHNIQUE: Multidetector CT imaging of the abdomen and pelvis was performed following the standard protocol without IV contrast. COMPARISON:  04/13/2015 FINDINGS: Lower chest: Lung bases demonstrate no acute consolidation or pleural effusion. Mitral calcifications are again visualized. Aortic valvular calcifications. Heart size nonenlarged. No large pericardial effusion. Hepatobiliary: No focal liver abnormality is seen. No gallstones, gallbladder wall thickening, or biliary dilatation. Pancreas: Atrophic pancreas.  No surrounding inflammation. Spleen: Normal in size without focal abnormality. Adrenals/Urinary Tract: Adrenal glands within normal limits. Kidneys show no hydronephrosis or focal  calcification. There is lobulated contour of the kidneys as before. The bladder is within normal limits. Stomach/Bowel: Stomach is not significantly enlarged. There are multiple loops of mildly dilated small bowel, measuring up to 3.6 cm in diameter within the central lower anterior abdomen, with suggestion of at least 1 transition point on sagittal images, series 5, image number 144 at the mid small bowel. There are decompressed loops of distal small bowel. Appendix is prominent in size but contains gas within its lumen and no surrounding inflammation. Moderate stool in the colon. Diverticular disease of the colon without acute inflammation or wall thickening. Probable diverticulum off the second portion of duodenum as before. Vascular/Lymphatic: Atherosclerotic vascular calcifications. No abnormally enlarged abdominal or pelvic lymph nodes. Reproductive: Status post hysterectomy. No adnexal masses. Other: No free air or free fluid. Musculoskeletal:  No suspicious bone lesions. IMPRESSION: 1. Multiple loops of mildly dilated small bowel with evidence of decompressed distal small bowel, findings would be consistent with mild mechanical small bowel obstruction. This could be secondary to adhesions. 2. Sigmoid colon diverticulosis without evidence for acute inflammation. Electronically Signed   By: Donavan Foil M.D.   On: 05/10/2016 03:59   Dg Abd Portable 1v-small Bowel Obstruction Protocol-initial, 8 Hr Delay  Result Date: 05/10/2016 CLINICAL DATA:  Small bowel obstruction protocol abdominal distension with pain EXAM: PORTABLE ABDOMEN - 1 VIEW COMPARISON:  05/10/2016 FINDINGS: Esophageal tube tip repositioned and projects over the proximal stomach. Contrast has migrated into the colon and rectum. Previously noted dilated loops of central small bowel are not visualized on the current exam. No pathologic calcifications. IMPRESSION: Contrast has migrated into the colon and rectum. Previously noted dilated loops of  small bowel in the central abdomen are not identified on the current exam. Electronically Signed   By: Donavan Foil M.D.   On: 05/10/2016 22:21   Dg Abd Portable 1v-small Bowel Protocol-position Verification  Result Date: 05/10/2016 CLINICAL DATA:  NG tube placement. EXAM: PORTABLE ABDOMEN - 1 VIEW 11:52 a.m. COMPARISON:  05/10/2014 at 5:25 a.m. FINDINGS: NG tube tip is in the distal stomach. Dilated small bowel consistent with small bowel obstruction. IMPRESSION: NG tube tip in the distal stomach.  Small bowel obstruction pattern. Electronically Signed   By: Lorriane Shire M.D.   On: 05/10/2016 12:27   Dg Abd Portable 1 View  Result Date: 05/10/2016 CLINICAL DATA:  Nasogastric tube placement. EXAM: PORTABLE ABDOMEN - 1 VIEW COMPARISON:  CT abdomen and pelvis May 11, 2015 FINDINGS: Nasogastric tube tip and side-port project in proximal to mid stomach. Gas distended small bowel in mid abdomen measuring up to 4.8 cm. No intra-abdominal mass effect or pathologic calcifications. Soft tissue planes and included osseous structures are nonsuspicious. IMPRESSION: Nasogastric tube tip projects in proximal to mid stomach. Electronically Signed   By: Elon Alas M.D.   On: 05/10/2016 05:50   Dg Abd Portable 1v  Result Date: 05/10/2016 CLINICAL DATA:  Nasogastric tube placement EXAM: PORTABLE ABDOMEN - 1 VIEW COMPARISON:  None FINDINGS: The nasogastric tube tip reaches the gastric lumen but the proximal port is at the EG junction. The tube should be advanced proximally 5-10 cm for optimal placement. Moderate small bowel dilatation and stacking is present. IMPRESSION: Recommend advancement of the nasogastric tube 5-10 cm for optimal placement. These results will be called to the ordering clinician or representative by the Radiologist Assistant, and communication documented in the PACS or zVision Dashboard. Electronically Signed   By: Andreas Newport M.D.   On: 05/10/2016 05:09     Subjective: Patient  had another regular BM overnight, has tolerated soft diet today without any abdominal pain, nausea, vomiting. No chest pain, dyspnea, or other complaints. Wants to go home.  Discharge Exam: Vitals:   05/12/16 0447 05/12/16 0533  BP: (!) 150/53 (!) 193/80  Pulse: 85   Resp: 20   Temp: 97.8 F (36.6 C)    Vitals:   05/11/16 2024 05/11/16 2354 05/12/16 0447 05/12/16 0533  BP: (!) 139/98 118/60 (!) 150/53 (!) 193/80  Pulse: 86  85   Resp: 18  20   Temp: 99.4 F (37.4 C)  97.8 F (36.6 C)   TempSrc: Oral  Oral   SpO2: 100%  99%   Weight:      Height:       General: A+Ox3, no distress  Cardiovascular: RRR, S1/S2 +, no rubs, no gallops Respiratory: Nonlabored, clear Abdominal: Soft, NT, ND, bowel sounds + Extremities: no edema, no cyanosis  Labs: BNP (last 3 results)  Recent Labs  11/15/15 0629  BNP [id number]   Basic Metabolic Panel:  Recent Labs Lab 05/09/16 2049 05/11/16 0531  NA 134* 137  K 4.4 3.5  CL 98* 103  CO2 25 25  GLUCOSE 272* 238*  BUN 26* 18  CREATININE 0.77 0.59  CALCIUM 9.2 8.8*   Liver Function Tests:  Recent Labs Lab 05/09/16 2049  AST 20  ALT 15  ALKPHOS 79  BILITOT 0.5  PROT 7.0  ALBUMIN 4.2    Recent Labs Lab 05/09/16 2049  LIPASE 23   CBC:  Recent Labs Lab 05/09/16 2049 05/11/16 0531  WBC 14.6* 7.3  HGB 12.4 11.9*  HCT 37.2 36.1  MCV 81.4 83.8  PLT 380 320   CBG:  Recent Labs Lab 05/11/16 2028 05/11/16 2349 05/12/16 0319 05/12/16 0728 05/12/16 1133  GLUCAP 237* 178* 147* 177* 240*   Urinalysis    Component Value Date/Time   COLORURINE YELLOW 05/10/2016 0110   APPEARANCEUR HAZY (A) 05/10/2016 0110   LABSPEC 1.030 05/10/2016 0110   PHURINE 5.0 05/10/2016 0110   GLUCOSEU 150 (A) 05/10/2016 0110   HGBUR NEGATIVE 05/10/2016 0110   BILIRUBINUR NEGATIVE 05/10/2016 0110   BILIRUBINUR Small 12/06/2014 1441   KETONESUR 5 (A) 05/10/2016 0110   PROTEINUR >=300 (A) 05/10/2016 0110   UROBILINOGEN 1.0 02/12/2015 1238    NITRITE NEGATIVE 05/10/2016 0110   LEUKOCYTESUR TRACE (A) 05/10/2016 0110   Time coordinating discharge: Over 30 minutes  Vance Gather, MD  [facility] 05/12/2016, 12:44 PM Pager [phone]

## 2016-05-12 NOTE — Consult Note (Signed)
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  Rest of incision is well approximated. Dressing procedure/placement/frequency: Patient to continue POC as directed by her surgeon and continue with their follow up:  She washed foot twice daily with antimicrobial soap and water, pats dry and applies a thin layer of silver sulfadiazine (Silvadene) cream to the wound, topping with a non-adherent gauze pad.  We will not perform wound care today prior to her discharge as she has plenty of silvadene cream at home and does not wish to have the pharmacy here dispense additional supply.  Dr.Grunz in for my assessment and we agree on the POC. [facility] nursing team will not follow, but will remain available to this patient, the nursing and medical teams.  Please re-consult if needed. Thanks, Maudie Flakes, MSN, RN, [place], Arther Abbott  Pager# [phone]

## 2016-05-13 ENCOUNTER — Telehealth: Payer: Self-pay | Admitting: Endocrinology

## 2016-05-13 MED ORDER — INSULIN DEGLUDEC 100 UNIT/ML ~~LOC~~ SOPN: 20.0000 [IU] | PEN_INJECTOR | Freq: Every day | SUBCUTANEOUS | 11 refills | Status: DC

## 2016-05-13 NOTE — Telephone Encounter (Signed)
please call patient: Ins wants you to change basaglar to tresiba.  I have sent a prescription to your pharmacy.  It is the same 20 units at bedtime. I'll see you next time.

## 2016-05-13 NOTE — Telephone Encounter (Signed)
I contacted the patient and advised of this message. Patient voiced understanding and had no further questions at this time.

## 2016-05-19 ENCOUNTER — Ambulatory Visit (INDEPENDENT_AMBULATORY_CARE_PROVIDER_SITE_OTHER): Payer: 59 | Admitting: Family Medicine

## 2016-05-19 ENCOUNTER — Encounter: Payer: Self-pay | Admitting: Family Medicine

## 2016-05-19 VITALS — BP 150/75 | HR 82 | Temp 98.4°F | Resp 16 | Ht 64.0 in | Wt 179.6 lb

## 2016-05-19 DIAGNOSIS — E1142 Type 2 diabetes mellitus with diabetic polyneuropathy: Secondary | ICD-10-CM | POA: Diagnosis not present

## 2016-05-19 DIAGNOSIS — Z89431 Acquired absence of right foot: Secondary | ICD-10-CM | POA: Diagnosis not present

## 2016-05-19 DIAGNOSIS — K56600 Partial intestinal obstruction, unspecified as to cause: Secondary | ICD-10-CM

## 2016-05-19 DIAGNOSIS — G4733 Obstructive sleep apnea (adult) (pediatric): Secondary | ICD-10-CM | POA: Diagnosis not present

## 2016-05-19 DIAGNOSIS — I2510 Atherosclerotic heart disease of native coronary artery without angina pectoris: Secondary | ICD-10-CM

## 2016-05-19 DIAGNOSIS — E0590 Thyrotoxicosis, unspecified without thyrotoxic crisis or storm: Secondary | ICD-10-CM

## 2016-05-19 DIAGNOSIS — I4819 Other persistent atrial fibrillation: Secondary | ICD-10-CM

## 2016-05-19 DIAGNOSIS — I481 Persistent atrial fibrillation: Secondary | ICD-10-CM

## 2016-05-19 NOTE — Progress Notes (Signed)
Subjective:    Patient ID: Judith [name], female    DOB: 07/27/1950, 66 y.o.   MRN: [id number]  05/19/2016  Hospitalization Follow-up   HPI This 66 y.o. female presents for [place] for partial small bowel obstruction.   S/p R toe amputation x 4 toes after last visit; now doing well.  After first amputation, suffered a recurrent infection and required further amputation.  SBO partial:  Developed abdominal pain on New Year's Eve; did not eat anything because had abdominal pain and swelling.  At 4:00pm, realized how painful abdomen was becoming.  Every time causes ambulance, cost $2,000 per trip.  Presented to ED; could not hardly walk due to abdominal pain; had deep shooting burning pain in upper abdomen. Then placed NG tube which was horrible for 2-3 days; on third day, had bowel movement and then had two more.  If there was a similar scenario again, would recognize symptoms.  No surgery warranted.  Was administering Dilaudid during admission and then developed horrible headache; blood pressure was super high; brother also develops horrible headache with Dilaudid. Tomorrow, follows up with cardiology/Turner.  Made a few changes with heart medications.  Thinks that blood pressure in hospital was due to elevated blood pressure.  Follow-up with Judith Given this week.  Next week sees Neurologist; pain in hands and feet with peripheral neuropathy; especially in hands.  Making sure sugars are good; Dr. Loanne Drilling changed long-acting insulin.     Has seen neurologist four times thus far.  Pt originally saw neurologist due to frequent falls; s/p NCS.  Sister was concerned that patient had Parkinson's disease.   Previous Lyrica but quit working.  Then took Cymbalta four four years and then quit working.  Has been taking Wellbutrin, Prozac, Neurontin.  No change in medications by neurology.    Below is the  discharge summary:  Admit date: 05/09/2016 Discharge date: 05/12/2016 Admitted From:  Home Disposition: Home   Recommendations for Outpatient Follow-up:  1. Follow up with PCP in 1-2 weeks for hospital follow up following self-resolving partial SBO. 2. Per pharmacy: Juluis Rainier - drug interaction between prozac and plavix - reduces conversion of plavix to active form. Patient appears to have been on this combination for some time. Consider change to alternate SSRI if clinically necessary. No change was made as an inpatient. Home Health: None Equipment/Devices: None Discharge Condition: Stable CODE STATUS: Full Diet recommendation: Soft > carbohydrate modified as tolerated. Brief/Interim Summary: 66 year old female with history of diabetes, hypertension, coronary artery disease, atrial fibrillation presented with left-sided abdominal pain. Patient with history of 2 cesarean section and had hysterectomy in 1988. CT scan of abdomen consistent with a small bowel obstruction probably due to adhesionsin the setting of prior surgery. NG tube was placed. with limited return to suction. Nausea has improved, and pt had BM overnight. Surgery was consulted and recommends no further follow up now that she has tolerated a soft diet and SBO has resolved.  Discharge Diagnoses:  Principal Problem:   SBO (small bowel obstruction) Active Problems:   DM2 (diabetes mellitus, type 2) (HCC)   HTN (hypertension)   PAF (paroxysmal atrial fibrillation) (HCC)   Hyperthyroidism  Small bowel obstruction: Had BM 1/1 and 1/2. - General surgery consulted, recommends no further follow up necessary as SBO is resolved. - Trransition back to po meds  T2DM: Held home meds - Sensitive scale SSI with Humolog (due to novolog allergy) Q4H  HTN  - continued home meds  PAF  - continued home  meds, continue eliquis  Hyperthyroidism  - continued tapazole   Immunization History  Administered Date(s) Administered  . Influenza,inj,Quad PF,36+ Mos 04/02/2014, 02/13/2015, 02/05/2016  . Pneumococcal  Conjugate-13 04/02/2014  . Pneumococcal Polysaccharide-23 02/05/2016  . Tdap 11/11/2012    BP Readings from Last 3 Encounters:  05/20/16 140/80  05/19/16 (!) 150/75  05/12/16 (!) 193/80   Wt Readings from Last 3 Encounters:  05/20/16 183 lb (83 kg)  05/19/16 179 lb 9.6 oz (81.5 kg)  05/10/16 180 lb (81.6 kg)    Review of Systems  Constitutional: Negative for chills, diaphoresis, fatigue and fever.  Eyes: Negative for visual disturbance.  Respiratory: Negative for cough and shortness of breath.   Cardiovascular: Negative for chest pain, palpitations and leg swelling.  Gastrointestinal: Negative for abdominal pain, constipation, diarrhea, nausea and vomiting.  Endocrine: Negative for cold intolerance, heat intolerance, polydipsia, polyphagia and polyuria.  Neurological: Positive for numbness. Negative for dizziness, tremors, seizures, syncope, facial asymmetry, speech difficulty, weakness, light-headedness and headaches.  Psychiatric/Behavioral: Negative for dysphoric mood, self-injury, sleep disturbance and suicidal ideas. The patient is not nervous/anxious.     Past Medical History:  Diagnosis Date  . Abnormal EKG 07/31/2013  . Arthritis    "hands" (03/06/2015)  . Asthma   . Carpal tunnel syndrome, bilateral   . Chronic kidney disease   . Complication of anesthesia    slow to wake up  . Coronary artery disease     2 v CAD with CTO of the RCA and high grade bifurcational LCx/OM stenosis. S/P PCI DES x 2 to the LCx/OM.  Marland Kitchen Diabetic peripheral neuropathy ([place]) "since 1996"  . GERD (gastroesophageal reflux disease)   . Goiter   . Headache    migraines prior to menopause  . History of shingles 06/01/2013  . Hyperlipidemia LDL goal <70 10/13/2015  . Hypertension   . Hyperthyroidism   . PAF (paroxysmal atrial fibrillation) ([place]) 04/29/2015   CHADS2VASC score of 4 now on Apixaban  . Pneumonia ~ 1976  . Tremors of nervous system   . Type II diabetes mellitus (HCC)    insulin  dependent   Past Surgical History:  Procedure Laterality Date  . ABDOMINAL HYSTERECTOMY  1988   age 65; CERVICAL DYSPLASIA; ovaries intact.   . AMPUTATION Right 01/23/2016   Procedure: Right 3rd Ray Amputation;  Surgeon: [name], MD;  Location: [facility];  Service: Orthopedics;  Laterality: Right;  . AMPUTATION Right 02/13/2016   Procedure: Right Transmetatarsal Amputation;  Surgeon: [name], MD;  Location: [facility];  Service: Orthopedics;  Laterality: Right;  . CARDIAC CATHETERIZATION N/A 02/27/2015   Procedure: Left Heart Cath and Coronary Angiography;  Surgeon: [name], MD; LAD 40%, mCFX 80%, OM 70%, RCA 100% calcified       . CARDIAC CATHETERIZATION N/A 03/06/2015   Procedure: Coronary Stent Intervention;  Surgeon: [name], MD;  Location: [facility];  Service: Cardiovascular;  Laterality: N/A;  Mid CX 3.50x12 promus DES w/ 0% resdual and Prox OM1 2.50x20 promus DES w/ 20% residual  . CARPAL TUNNEL RELEASE Right Nov 2015  . CARPAL TUNNEL RELEASE Right 1992; 05/2014   [name]; [place]  . CESAREAN SECTION  1982; 1984  . FOOT NEUROMA SURGERY Bilateral 2000  . KNEE ARTHROSCOPY Right ~ 2003   "meniscus repair"  . SHOULDER OPEN ROTATOR CUFF REPAIR Right 1996; 1998   "w/fracture repair"  . THYROID SURGERY  2000   "removed lots of nodules"  . TONSILLECTOMY  1976  Allergies  Allergen Reactions  . Novolog [Insulin Aspart] Shortness Of Breath    "breathing problems"  . Codeine Nausea And Vomiting    HIGH DOSES-SEVERE VOMITING  . Iodine Other (See Comments)    MUST HAVE BENADRYL PRIOR TO PROCEDURE AND RIGHT BEFORE TREATMENT TO COUNTERACT REACTION-BLISTERING REACTION DERMATOLOGICAL  . Penicillins Itching, Rash and Other (See Comments)    Has patient had a PCN reaction causing immediate rash, facial/tongue/throat swelling, SOB or lightheadedness with hypotension: Yes Has patient had a PCN reaction causing severe rash involving mucus membranes or skin necrosis:  No Has patient had a PCN reaction that required hospitalization No Has patient had a PCN reaction occurring within the last 10 years: No If all of the above answers are "NO", then may proceed with Cephalosporin use.  CHEST SIZED RASH AND ITCHING   . Propofol Other (See Comments)    "Breathing problems - asthma attack"  . Dilaudid [Hydromorphone Hcl]     HEADACHE  . Ace Inhibitors Cough  . Demerol [Meperidine] Nausea And Vomiting  . Neosporin [Neomycin-Bacitracin Zn-Polymyx] Itching and Rash    MAKES REACTIONS WORSE WHEN USING AS PROPHYLACTIC  . Percocet [Oxycodone-Acetaminophen] Rash  . Tape Itching and Rash    Social History   Social History  . Marital status: Divorced    Spouse name: N/A  . Number of children: 2  . Years of education: Masters   Occupational History  . Food Therapist, nutritional    Social History Main Topics  . Smoking status: Former Smoker    Packs/day: 0.00    Years: 41.00    Types: Cigarettes    Quit date: 03/05/2015  . Smokeless tobacco: Never Used     Comment: 04/29/2015 "quit smoking cigarettes 02/27/2015"  . Alcohol use No  . Drug use: No  . Sexual activity: Not Currently    Birth control/ protection: Post-menopausal, Surgical   Other Topics Concern  . 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      Lives with sister in [place].      Tobacco: 1 ppd x 41 years - quit 2016      Alcohol: none      Drugs: none      Exercise:  Walking in neighborhood; physical job.   Right-handed.   2 cups caffeine daily.      Family History  Problem Relation Age of Onset  . Cancer Mother 63    bronchial cancer  . Breast cancer Mother   . Lung cancer Mother   . Hypertension Father   . COPD Father   . Heart disease Father 59     CAD with cardiac stenting  . Heart attack Father   . Parkinson's disease Father   . Allergies Sister   . Breast cancer Maternal Grandmother   . Emphysema Maternal Grandfather   . Leukemia Paternal Grandmother   . Emphysema Paternal Grandfather   . Thyroid disease Neg Hx        Objective:    BP (!) 150/75 (BP Location: Right Arm, Patient Position: Sitting, Cuff Size: Small)   Pulse 82   Temp 98.4 F (36.9 C) (Oral)   Resp 16   Ht 5\' 4"  (1.626 m)   Wt 179 lb 9.6  oz (81.5 kg)   SpO2 97%   BMI 30.83 kg/m  Physical Exam  Constitutional: She is oriented to person, place, and time. She appears well-developed and well-nourished. No distress.  HENT:  Head: Normocephalic and atraumatic.  Right Ear: External ear normal.  Left Ear: External ear normal.  Nose: Nose normal.  Mouth/Throat: Oropharynx is clear and moist.  Eyes: Conjunctivae and EOM are normal. Pupils are equal, round, and reactive to light.  Neck: Normal range of motion. Neck supple. Carotid bruit is not present. No thyromegaly present.  Cardiovascular: Normal rate, regular rhythm, normal heart sounds and intact distal pulses.  Exam reveals no gallop and no friction rub.   No murmur heard. Pulmonary/Chest: Effort normal and breath sounds normal. She has no wheezes. She has no rales.  Abdominal: Soft. Bowel sounds are normal. She exhibits no distension and no mass. There is no tenderness. There is no rebound and no guarding.  Musculoskeletal:  Well healed amputation distal R foot.  Lymphadenopathy:    She has no cervical adenopathy.  Neurological: She is alert and oriented to person, place, and time. No cranial nerve deficit.  Skin: Skin is warm and dry. No rash noted. She is not diaphoretic. No erythema. No pallor.  Psychiatric: She has a normal mood and affect. Her behavior is normal.   Results for orders placed or performed during the hospital encounter of 05/09/16  Lipase, blood  Result Value Ref Range   Lipase 23  11 - 51 U/L  Comprehensive metabolic panel  Result Value Ref Range   Sodium 134 (L) 135 - 145 mmol/L   Potassium 4.4 3.5 - 5.1 mmol/L   Chloride 98 (L) 101 - 111 mmol/L   CO2 25 22 - 32 mmol/L   Glucose, Bld 272 (H) 65 - 99 mg/dL   BUN 26 (H) 6 - 20 mg/dL   Creatinine, Ser 0.77 0.44 - 1.00 mg/dL   Calcium 9.2 8.9 - 10.3 mg/dL   Total Protein 7.0 6.5 - 8.1 g/dL   Albumin 4.2 3.5 - 5.0 g/dL   AST 20 15 - 41 U/L   ALT 15 14 - 54 U/L   Alkaline Phosphatase 79 38 - 126 U/L   Total Bilirubin 0.5 0.3 - 1.2 mg/dL   GFR calc non Af Amer >60 >60 mL/min   GFR calc Af Amer >60 >60 mL/min   Anion gap 11 5 - 15  CBC  Result Value Ref Range   WBC 14.6 (H) 4.0 - 10.5 K/uL   RBC 4.57 3.87 - 5.11 MIL/uL   Hemoglobin 12.4 12.0 - 15.0 g/dL   HCT 37.2 36.0 - 46.0 %   MCV 81.4 78.0 - 100.0 fL   MCH 27.1 26.0 - 34.0 pg   MCHC 33.3 30.0 - 36.0 g/dL   RDW 13.9 11.5 - 15.5 %   Platelets 380 150 - 400 K/uL  Urinalysis, Routine w reflex microscopic  Result Value Ref Range   Color, Urine YELLOW YELLOW   APPearance HAZY (A) CLEAR   Specific Gravity, Urine 1.030 1.005 - 1.030   pH 5.0 5.0 - 8.0   Glucose, UA 150 (A) NEGATIVE mg/dL   Hgb urine dipstick NEGATIVE NEGATIVE   Bilirubin Urine NEGATIVE NEGATIVE   Ketones, ur 5 (A) NEGATIVE mg/dL   Protein, ur >=300 (A) NEGATIVE mg/dL   Nitrite NEGATIVE NEGATIVE   Leukocytes, UA TRACE (A) NEGATIVE   RBC / HPF 0-5 0 - 5 RBC/hpf   WBC, UA 6-30 0 - 5  WBC/hpf   Bacteria, UA NONE SEEN NONE SEEN   Squamous Epithelial / LPF 0-5 (A) NONE SEEN   Hyaline Casts, UA PRESENT   Glucose, capillary  Result Value Ref Range   Glucose-Capillary 278 (H) 65 - 99 mg/dL  Basic metabolic panel  Result Value Ref Range   Sodium 137 135 - 145 mmol/L   Potassium 3.5 3.5 - 5.1 mmol/L   Chloride 103 101 - 111 mmol/L   CO2 25 22 - 32 mmol/L   Glucose, Bld 238 (H) 65 - 99 mg/dL   BUN 18 6 - 20 mg/dL   Creatinine, Ser 0.59 0.44 - 1.00 mg/dL   Calcium 8.8 (L) 8.9 - 10.3 mg/dL    GFR calc non Af Amer >60 >60 mL/min   GFR calc Af Amer >60 >60 mL/min   Anion gap 9 5 - 15  CBC  Result Value Ref Range   WBC 7.3 4.0 - 10.5 K/uL   RBC 4.31 3.87 - 5.11 MIL/uL   Hemoglobin 11.9 (L) 12.0 - 15.0 g/dL   HCT 36.1 36.0 - 46.0 %   MCV 83.8 78.0 - 100.0 fL   MCH 27.6 26.0 - 34.0 pg   MCHC 33.0 30.0 - 36.0 g/dL   RDW 14.3 11.5 - 15.5 %   Platelets 320 150 - 400 K/uL  Glucose, capillary  Result Value Ref Range   Glucose-Capillary 235 (H) 65 - 99 mg/dL  Glucose, capillary  Result Value Ref Range   Glucose-Capillary 214 (H) 65 - 99 mg/dL  Glucose, capillary  Result Value Ref Range   Glucose-Capillary 222 (H) 65 - 99 mg/dL  Glucose, capillary  Result Value Ref Range   Glucose-Capillary 218 (H) 65 - 99 mg/dL  Glucose, capillary  Result Value Ref Range   Glucose-Capillary 202 (H) 65 - 99 mg/dL   Comment 1 Notify RN   Glucose, capillary  Result Value Ref Range   Glucose-Capillary 198 (H) 65 - 99 mg/dL   Comment 1 Notify RN   Glucose, capillary  Result Value Ref Range   Glucose-Capillary 161 (H) 65 - 99 mg/dL   Comment 1 Notify RN   Glucose, capillary  Result Value Ref Range   Glucose-Capillary 237 (H) 65 - 99 mg/dL  Glucose, capillary  Result Value Ref Range   Glucose-Capillary 178 (H) 65 - 99 mg/dL  Glucose, capillary  Result Value Ref Range   Glucose-Capillary 147 (H) 65 - 99 mg/dL   Comment 1 Notify RN   Glucose, capillary  Result Value Ref Range   Glucose-Capillary 177 (H) 65 - 99 mg/dL  Glucose, capillary  Result Value Ref Range   Glucose-Capillary 240 (H) 65 - 99 mg/dL       Assessment & Plan:   1. Partial small bowel obstruction   2. S/P transmetatarsal amputation of foot, right ([place])   3. Diabetic peripheral neuropathy ([place])   4. Hyperthyroidism   5. Coronary artery disease involving native coronary artery of native heart without angina pectoris   6. Persistent atrial fibrillation ([place])   7. Obstructive sleep apnea     -admission records  reviewed in detail during visit; asymptomatic today; no further abdominal pain; tolerating po well.  -also reviewed recent admissions and transmetatarsal amputation of R foot. -coping well emotionally despite multiple admissions. -no changes to medical management at this time. -consider changing Prozac to different medication in future; if only taking for PN, consider discontinuing. -prolonged face to face for 45 minutes with greater than 50% of time  dedicated to counseling, coordination of care, and review of multiple admissions, labs, imaging studies, operative reports that have occurred in the past three to six months.   No orders of the defined types were placed in this encounter.  No orders of the defined types were placed in this encounter.   Return in about 6 months (around 11/16/2016) for complete physical examiniation.   Ashten Prats Elayne Guerin, M.D. [facility] [address] [place], [place]  [address] [phone] phone [phone] fax

## 2016-05-19 NOTE — Patient Instructions (Signed)
     IF you received an x-ray today, you will receive an invoice from [facility]. Please contact [facility] at [phone] with questions or concerns regarding your invoice.   IF you received labwork today, you will receive an invoice from [facility]. Please contact [facility] at [phone] with questions or concerns regarding your invoice.   Our billing staff will not be able to assist you with questions regarding bills from these companies.  You will be contacted with the lab results as soon as they are available. The fastest way to get your results is to activate your My Chart account. Instructions are located on the last page of this paperwork. If you have not heard from us regarding the results in 2 weeks, please contact this office.     

## 2016-05-20 ENCOUNTER — Encounter: Payer: Self-pay | Admitting: Cardiology

## 2016-05-20 ENCOUNTER — Ambulatory Visit (INDEPENDENT_AMBULATORY_CARE_PROVIDER_SITE_OTHER): Payer: Managed Care, Other (non HMO) | Admitting: Cardiology

## 2016-05-20 VITALS — BP 140/80 | HR 80 | Ht 64.0 in | Wt 183.0 lb

## 2016-05-20 DIAGNOSIS — I481 Persistent atrial fibrillation: Secondary | ICD-10-CM

## 2016-05-20 DIAGNOSIS — E785 Hyperlipidemia, unspecified: Secondary | ICD-10-CM

## 2016-05-20 DIAGNOSIS — I2510 Atherosclerotic heart disease of native coronary artery without angina pectoris: Secondary | ICD-10-CM | POA: Diagnosis not present

## 2016-05-20 DIAGNOSIS — I10 Essential (primary) hypertension: Secondary | ICD-10-CM

## 2016-05-20 DIAGNOSIS — I4819 Other persistent atrial fibrillation: Secondary | ICD-10-CM

## 2016-05-20 NOTE — Patient Instructions (Signed)
Medication Instructions:  Your physician recommends that you continue on your current medications as directed. Please refer to the Current Medication list given to you today.   Labwork: Your physician recommends that you return for FASTING lab work.  Testing/Procedures: None  Follow-Up: Your physician wants you to follow-up in: 6 months with Dr. Turner. You will receive a reminder letter in the mail two months in advance. If you don't receive a letter, please call our office to schedule the follow-up appointment.   Any Other Special Instructions Will Be Listed Below (If Applicable).     If you need a refill on your cardiac medications before your next appointment, please call your pharmacy.   

## 2016-05-20 NOTE — Progress Notes (Signed)
Cardiology Office Note    Date:  05/24/2016   ID:  Judith [name], DOB [birth year], MRN [id number]  PCP:  Reginia Forts, MD  Cardiologist:  Fransico Him, MD   Chief Complaint  Patient presents with  . Coronary Artery Disease  . Hypertension  . Atrial Fibrillation    History of Present Illness:  Judith [name] is a 66 y.o. female with a history of HTN, ASCAD with CTO of the RCA and high grade LCx/OM stenosis s/p DES x 2, hyperlipidemia, Type II DM, Hyperthyroidism on Methimazole, Persistent atrial fibrillation on apixaban and Sotolol.  She is doing well today.  She denies any chest pain, SOB, DOE, dizziness or syncope. Since going on Sotolol she has only had 1 episode of breakthrough afib and had forgotten to take her Sotolol.  She took her pill and 30 minutes later she was back in NSR.  Since I saw her last she had a right transmetatarsal amputation and sometimes have some LE edema.She has no claudication symptoms.     Past Medical History:  Diagnosis Date  . Abnormal EKG 07/31/2013  . Arthritis    "hands" (03/06/2015)  . Asthma   . Carpal tunnel syndrome, bilateral   . Chronic kidney disease   . Complication of anesthesia    slow to wake up  . Coronary artery disease     2 v CAD with CTO of the RCA and high grade bifurcational LCx/OM stenosis. S/P PCI DES x 2 to the LCx/OM.  Marland Kitchen Diabetic peripheral neuropathy ([place]) "since 1996"  . GERD (gastroesophageal reflux disease)   . Goiter   . Headache    migraines prior to menopause  . History of shingles 06/01/2013  . Hyperlipidemia LDL goal <70 10/13/2015  . Hypertension   . Hyperthyroidism   . PAF (paroxysmal atrial fibrillation) ([place]) 04/29/2015   CHADS2VASC score of 4 now on Apixaban  . Pneumonia ~ 1976  . Tremors of nervous system   . Type II diabetes mellitus (HCC)    insulin dependent    Past Surgical History:  Procedure Laterality Date  . ABDOMINAL HYSTERECTOMY  1988   age 50; CERVICAL DYSPLASIA; ovaries intact.   .  AMPUTATION Right 01/23/2016   Procedure: Right 3rd Ray Amputation;  Surgeon: [name], MD;  Location: [facility];  Service: Orthopedics;  Laterality: Right;  . AMPUTATION Right 02/13/2016   Procedure: Right Transmetatarsal Amputation;  Surgeon: [name], MD;  Location: [facility];  Service: Orthopedics;  Laterality: Right;  . CARDIAC CATHETERIZATION N/A 02/27/2015   Procedure: Left Heart Cath and Coronary Angiography;  Surgeon: [name], MD; LAD 40%, mCFX 80%, OM 70%, RCA 100% calcified       . CARDIAC CATHETERIZATION N/A 03/06/2015   Procedure: Coronary Stent Intervention;  Surgeon: [name], MD;  Location: [facility];  Service: Cardiovascular;  Laterality: N/A;  Mid CX 3.50x12 promus DES w/ 0% resdual and Prox OM1 2.50x20 promus DES w/ 20% residual  . CARPAL TUNNEL RELEASE Right Nov 2015  . CARPAL TUNNEL RELEASE Right 1992; 05/2014   [name]; [facility]  . CESAREAN SECTION  1982; 1984  . FOOT NEUROMA SURGERY Bilateral 2000  . KNEE ARTHROSCOPY Right ~ 2003   "meniscus repair"  . SHOULDER OPEN ROTATOR CUFF REPAIR Right 1996; 1998   "w/fracture repair"  . THYROID SURGERY  2000   "removed lots of nodules"  . TONSILLECTOMY  1976    Current Medications: Outpatient Medications Prior to Visit  Medication Sig Dispense  Refill  . albuterol (PROVENTIL HFA) 108 (90 Base) MCG/ACT inhaler Inhale 1-2 puffs into the lungs every 6 (six) hours as needed for wheezing or shortness of breath. 1 Inhaler 0  . amLODipine (NORVASC) 5 MG tablet Take 1 tablet (5 mg total) by mouth daily. 30 tablet 11  . apixaban (ELIQUIS) 5 MG TABS tablet Take 1 tablet (5 mg total) by mouth 2 (two) times daily. 60 tablet 11  . atorvastatin (LIPITOR) 20 MG tablet Take 1 tablet (20 mg total) by mouth daily. 90 tablet 1  . buPROPion (WELLBUTRIN SR) 150 MG 12 hr tablet Take 1 tablet (150 mg total) by mouth 2 (two) times daily. 180 tablet 3  . clindamycin (CLEOCIN) 300 MG capsule Take 300 mg by mouth See admin  instructions. Take 1 capsule (300 mg) one hour prior to dental appointment - last taken 02/05/16    . clopidogrel (PLAVIX) 75 MG tablet TAKE ONE TABLET BY MOUTH ONCE DAILY (Patient taking differently: TAKE 75 MG BY MOUTH ONCE DAILY) 90 tablet 3  . FLUoxetine (PROZAC) 20 MG capsule Take 1 capsule (20 mg total) by mouth at bedtime. 90 capsule 3  . fluticasone (FLOVENT HFA) 44 MCG/ACT inhaler Inhale 2 puffs into the lungs 2 (two) times daily as needed (shortness of breath/ wheezing).     . gabapentin (NEURONTIN) 300 MG capsule TAKE 3 CAPSULES BY MOUTH AT morning AND 3 CAPSULES AT BEDTIME (Patient taking differently: Take 900 mg by mouth 2 (two) times daily. TAKE 3 CAPSULES BY MOUTH EVERY  MORNING AND 3 CAPSULES AT BEDTIME) 540 capsule 1  . insulin degludec (TRESIBA FLEXTOUCH) 100 UNIT/ML SOPN FlexTouch Pen Inject 0.2 mLs (20 Units total) into the skin at bedtime. 5 pen 11  . insulin lispro (HUMALOG) 100 UNIT/ML injection 3 times a day (just before each meal) 15-25-10 units (Patient taking differently: Inject into the skin 3 (three) times daily with meals. 3 times a day (just before each meal) 15 units EVERY MORNING -25 units EVERY AFTERNOON-10 units EVERY EVENING) 30 mL 11  . losartan (COZAAR) 50 MG tablet TAKE ONE TABLET BY MOUTH ONCE DAILY (Patient taking differently: TAKE 50 MG BY MOUTH ONCE DAILY) 90 tablet 3  . metFORMIN (GLUCOPHAGE-XR) 500 MG 24 hr tablet Take 4 tablets (2,000 mg total) by mouth daily with breakfast. (Patient taking differently: Take 500 mg by mouth 2 (two) times daily. ) 360 tablet 3  . methimazole (TAPAZOLE) 10 MG tablet Take 1 tablet (10 mg total) by mouth daily. 30 tablet 11  . metoprolol succinate (TOPROL-XL) 25 MG 24 hr tablet Take 1 tablet (25 mg total) by mouth 2 (two) times daily. Take with or immediately following a meal. 60 tablet 11  . nitroGLYCERIN (NITRODUR - DOSED IN MG/24 HR) 0.2 mg/hr patch Place 1 patch (0.2 mg total) onto the skin daily. Patch burns patient so she  cuts it in half 30 patch 2  . nitroGLYCERIN (NITROSTAT) 0.4 MG SL tablet Place 1 tablet (0.4 mg total) under the tongue every 5 (five) minutes as needed for chest pain. 25 tablet 3  . ondansetron (ZOFRAN) 4 MG tablet Take 1 tablet (4 mg total) by mouth every 8 (eight) hours as needed for nausea or vomiting. 20 tablet 0  . pantoprazole (PROTONIX) 40 MG tablet Take 1 tablet (40 mg total) by mouth daily. 90 tablet 3  . silver sulfADIAZINE (SILVADENE) 1 % cream Apply 1 application topically daily. 400 g 0  . sotalol (BETAPACE) 80 MG tablet Take 1  tablet (80 mg total) by mouth every 12 (twelve) hours. 60 tablet 11  . traMADol (ULTRAM) 50 MG tablet Take 1 tablet (50 mg total) by mouth every 6 (six) hours as needed. Maximum dose= 8 tablets per day (Patient taking differently: Take 100 mg by mouth at bedtime. Maximum dose= 8 tablets per day) 60 tablet 0  . cyclobenzaprine (FLEXERIL) 5 MG tablet Take 5 mg by mouth at bedtime as needed for muscle spasms.     No facility-administered medications prior to visit.      Allergies:   Novolog [insulin aspart]; Codeine; Iodine; Penicillins; Propofol; Dilaudid [hydromorphone hcl]; Ace inhibitors; Demerol [meperidine]; Neosporin [neomycin-bacitracin zn-polymyx]; Percocet [oxycodone-acetaminophen]; and Tape   Social History   Social History  . Marital status: Divorced    Spouse name: N/A  . Number of children: 2  . Years of education: Masters   Occupational History  . Food Therapist, nutritional    Social History Main Topics  . Smoking status: Former Smoker    Packs/day: 0.00    Years: 41.00    Types: Cigarettes    Quit date: 03/05/2015  . Smokeless tobacco: Never Used     Comment: 04/29/2015 "quit smoking cigarettes 02/27/2015"  . Alcohol use No  . Drug use: No  . Sexual activity: Not Currently    Birth control/ protection: Post-menopausal, Surgical   Other Topics Concern  . 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      Lives with sister in [place].      Tobacco: 1 ppd x 41 years - quit 2016      Alcohol: none      Drugs: none      Exercise:  Walking in neighborhood; physical job.   Right-handed.   2 cups caffeine daily.        Family History:  The patient's family history includes Allergies in her sister; Breast cancer in her maternal grandmother and mother; COPD in her father; Cancer (age of onset: 52) in her mother; Emphysema in her maternal grandfather and paternal grandfather; Heart attack in her father; Heart disease (age of onset: 38) in her father; Hypertension in her father; Leukemia in her paternal grandmother; Lung cancer in her mother; Parkinson's disease in her father.   ROS:   Please see the history of present illness.    ROS All other systems reviewed and are negative.  No flowsheet data found.     PHYSICAL EXAM:   VS:  BP 140/80 (BP Location: Right Arm, Patient Position: Sitting, Cuff Size: Normal)   Pulse 80   Ht 5\' 4"  (1.626 m)   Wt 183 lb (83 kg)   BMI 31.41 kg/m    GEN: Well nourished, well developed, in no acute distress  HEENT: normal  Neck: no JVD, carotid bruits, or masses Cardiac: RRR; no murmurs, rubs, or gallops,no edema.  Intact distal pulses bilaterally.  Respiratory:  clear to auscultation bilaterally, normal work of breathing GI: soft, nontender, nondistended, + BS MS: no deformity or atrophy  Skin: warm and dry, no rash Neuro:  Alert and Oriented x 3, Strength and sensation are intact Psych: euthymic mood, full affect  Wt Readings from Last 3 Encounters:  05/20/16 183 lb (83 kg)  05/19/16 179 lb 9.6  oz (81.5 kg)  05/10/16 180 lb (81.6 kg)      Studies/Labs Reviewed:   EKG:  EKG is ordered today.  The ekg ordered today demonstrates NSR at 80bpm with no ST  changes and normal QTc  Recent Labs: 11/15/2015: B Natriuretic Peptide 62.6 11/18/2015: Magnesium 1.7 03/24/2016: TSH 2.99 05/09/2016: ALT 15 05/11/2016: BUN 18; Creatinine, Ser 0.59; Hemoglobin 11.9; Platelets 320; Potassium 3.5; Sodium 137   Lipid Panel    Component Value Date/Time   CHOL 187 02/05/2016 1047   TRIG 380 (H) 02/05/2016 1047   HDL 32 (L) 02/05/2016 1047   CHOLHDL 5.8 (H) 02/05/2016 1047   VLDL 76 (H) 02/05/2016 1047   LDLCALC 79 02/05/2016 1047    Additional studies/ records that were reviewed today include:  none    ASSESSMENT:    1. Coronary artery disease involving native coronary artery of native heart without angina pectoris   2. Persistent atrial fibrillation ([place] [place])   3. Essential hypertension   4. Hyperlipidemia LDL goal <70      PLAN:  In order of problems listed above:  1. ASCAD -  2 v CAD with CTO of the RCA and high grade bifurcational LCx/OM stenosis. S/P PCI DES x 2 to the LCx/OM.She has not had any further anginal chest pain.  Continue Plavix/BB and statin.  Not on ASA due to Eliquis.  2.   Persistent atrial fibrillation maintaining NSR on Sotolol.  She has not had any palpitations.  Continue Sotolol/BB and Eliquis.  QTc stable on EKG today at 464msec.  3.   HTN - BP controlled on current meds.  Continue amlodipine/BB/ARB.  4.   Hyperlipidemia with LDL goal < 70.  Continue statin.  Check FLp and ALT.      Medication Adjustments/Labs and Tests Ordered: Current medicines are reviewed at length with the patient today.  Concerns regarding medicines are outlined above.  Medication changes, Labs and Tests ordered today are listed in the Patient Instructions below.  Patient Instructions  Medication Instructions:  Your physician recommends that you continue on your current medications as directed. Please refer to the Current Medication list given to you today.   Labwork: Your physician recommends that you return for FASTING lab work.    Testing/Procedures: None  Follow-Up: Your physician wants you to follow-up in: 6 months with Dr. Radford Pax. You will receive a reminder letter in the mail two months in advance. If you don't receive a letter, please call our office to schedule the follow-up appointment.   Any Other Special Instructions Will Be Listed Below (If Applicable).     If you need a refill on your cardiac medications before your next appointment, please call your pharmacy.      Signed, Fransico Him, MD  05/24/2016 3:23 PM    [facility] [address] [place], [place], [place]  [address] Phone: [phone]; Fax: [phone]

## 2016-05-21 ENCOUNTER — Ambulatory Visit (INDEPENDENT_AMBULATORY_CARE_PROVIDER_SITE_OTHER): Payer: Managed Care, Other (non HMO) | Admitting: Orthopedic Surgery

## 2016-05-21 ENCOUNTER — Ambulatory Visit (INDEPENDENT_AMBULATORY_CARE_PROVIDER_SITE_OTHER): Payer: Managed Care, Other (non HMO) | Admitting: Family

## 2016-05-21 DIAGNOSIS — Z89431 Acquired absence of right foot: Secondary | ICD-10-CM | POA: Diagnosis not present

## 2016-05-21 DIAGNOSIS — L97511 Non-pressure chronic ulcer of other part of right foot limited to breakdown of skin: Secondary | ICD-10-CM | POA: Diagnosis not present

## 2016-05-21 NOTE — Progress Notes (Signed)
Office Visit Note   Patient: Judith [name]           Date of Birth: [birth year]           MRN: [id number] Visit Date: 05/21/2016              Requested by: [name], MD [address] [place], [place] [address] PCP: Reginia Forts, MD  Chief Complaint  Patient presents with  . Right Foot - Follow-up    Right transmetatarsal amputation 02/13/16    HPI: Patient is s/p a right Right transmetatarsal amputation 02/13/16.  The incision is about 2cm and there is a thick yellow drainage. She is treating with santyl dressing twice a day. Full weight bearing in a post op shoe. There is no odor and no redness and no swelling. The pt states that she is feeling well and she does not have any concerns today. Autumn L Forrest, RMA   3 months status post transmetatarsal amputation on the right patient states that she is doing much better. She complains of problems with balance. Assessment & Plan: Visit Diagnoses:  1. S/P transmetatarsal amputation of foot, right ([place])     Plan: Patient is given prescription for [facility] for double upright brace is extra-depth shoe custom orthotics and a spacer for right foot. This should help with her balance. Follow-up in 4 weeks.  Follow-Up Instructions: Return in about 4 weeks (around 06/18/2016).   Ortho Exam Examination the wound is healing quite nicely after debridement of fibrinous exudative tissue there is healthy beefy granulation tissue this measures 5 x 15 mm and 3 mm deep. A sterile Band-Aid was applied. Patient has no ascending cellulitis. She is alert oriented no adenopathy well-dressed normal left eye, respiratory effort she does have an antalgic gait. She is wearing medical compression stockings.  Imaging: No results found.  Orders:  No orders of the defined types were placed in this encounter.  No orders of the defined types were placed in this encounter.    Procedures: No procedures performed  Clinical Data: No additional  findings.  Subjective: Review of Systems  Objective: Vital Signs: There were no vitals taken for this visit.  Specialty Comments:  No specialty comments available.  PMFS History: Patient Active Problem List   Diagnosis Date Noted  . SBO (small bowel obstruction) 05/10/2016  . S/P transmetatarsal amputation of foot, right ([place]) 04/19/2016  . Obstructive sleep apnea 11/26/2015  . Bilateral carpal tunnel syndrome 11/26/2015  . Hypomagnesemia 11/16/2015  . Hyperlipidemia LDL goal <70 10/13/2015  . Abnormality of gait 09/02/2015  . Memory loss 08/12/2015  . Diabetic peripheral neuropathy ([place]) 08/12/2015  . Vitamin D deficiency 08/12/2015  . Hyperthyroidism 04/30/2015  . Heme positive stool   . Persistent atrial fibrillation ([place]) 04/29/2015  . Coronary artery disease involving native coronary artery of native heart without angina pectoris 03/29/2015  . Groin hematoma Aug 03, 202016  . Exertional angina ([place]) 03/06/2015  . Abnormal nuclear stress test   . Acute kidney injury ([place]) 02/12/2015  . Acute hyponatremia 02/12/2015  . History of goiter 09/28/2014  . GERD (gastroesophageal reflux disease) 07/31/2013  . Depression with anxiety 06/01/2013  . DM2 (diabetes mellitus, type 2) ([place]) 01/30/2013  . HTN (hypertension) 01/30/2013  . Diabetic neuropathy, painful ([place]) 01/30/2013  . Tobacco user 01/30/2013   Past Medical History:  Diagnosis Date  . Abnormal EKG 07/31/2013  . Arthritis    "hands" (03/06/2015)  . Asthma   . Carpal tunnel syndrome, bilateral   .  Chronic kidney disease   . Complication of anesthesia    slow to wake up  . Coronary artery disease     2 v CAD with CTO of the RCA and high grade bifurcational LCx/OM stenosis. S/P PCI DES x 2 to the LCx/OM.  Marland Kitchen Diabetic peripheral neuropathy ([place]) "since 1996"  . GERD (gastroesophageal reflux disease)   . Goiter   . Headache    migraines prior to menopause  . History of shingles 06/01/2013  . Hyperlipidemia LDL goal  <70 10/13/2015  . Hypertension   . Hyperthyroidism   . PAF (paroxysmal atrial fibrillation) ([place]) 04/29/2015   CHADS2VASC score of 4 now on Apixaban  . Pneumonia ~ 1976  . Tremors of nervous system   . Type II diabetes mellitus (HCC)    insulin dependent    Family History  Problem Relation Age of Onset  . Cancer Mother 35    bronchial cancer  . Breast cancer Mother   . Lung cancer Mother   . Hypertension Father   . COPD Father   . Heart disease Father 45    CAD with cardiac stenting  . Heart attack Father   . Parkinson's disease Father   . Allergies Sister   . Breast cancer Maternal Grandmother   . Emphysema Maternal Grandfather   . Leukemia Paternal Grandmother   . Emphysema Paternal Grandfather   . Thyroid disease Neg Hx     Past Surgical History:  Procedure Laterality Date  . ABDOMINAL HYSTERECTOMY  1988   age 89; CERVICAL DYSPLASIA; ovaries intact.   . AMPUTATION Right 01/23/2016   Procedure: Right 3rd Ray Amputation;  Surgeon: [name], MD;  Location: [facility];  Service: Orthopedics;  Laterality: Right;  . AMPUTATION Right 02/13/2016   Procedure: Right Transmetatarsal Amputation;  Surgeon: [name], MD;  Location: [facility];  Service: Orthopedics;  Laterality: Right;  . CARDIAC CATHETERIZATION N/A 02/27/2015   Procedure: Left Heart Cath and Coronary Angiography;  Surgeon: [name], MD; LAD 40%, mCFX 80%, OM 70%, RCA 100% calcified       . CARDIAC CATHETERIZATION N/A 03/06/2015   Procedure: Coronary Stent Intervention;  Surgeon: [name], MD;  Location: [facility];  Service: Cardiovascular;  Laterality: N/A;  Mid CX 3.50x12 promus DES w/ 0% resdual and Prox OM1 2.50x20 promus DES w/ 20% residual  . CARPAL TUNNEL RELEASE Right Nov 2015  . CARPAL TUNNEL RELEASE Right 1992; 05/2014   [name]; [place]  . CESAREAN SECTION  1982; 1984  . FOOT NEUROMA SURGERY Bilateral 2000  . KNEE ARTHROSCOPY Right ~ 2003   "meniscus repair"  . SHOULDER OPEN ROTATOR  CUFF REPAIR Right 1996; 1998   "w/fracture repair"  . THYROID SURGERY  2000   "removed lots of nodules"  . TONSILLECTOMY  1976   Social History   Occupational History  . Food Therapist, nutritional    Social History Main Topics  . Smoking status: Former Smoker    Packs/day: 0.00    Years: 41.00    Types: Cigarettes    Quit date: 03/05/2015  . Smokeless tobacco: Never Used     Comment: 04/29/2015 "quit smoking cigarettes 02/27/2015"  . Alcohol use No  . Drug use: No  . Sexual activity: Not Currently    Birth control/ protection: Post-menopausal, Surgical

## 2016-05-24 ENCOUNTER — Telehealth (INDEPENDENT_AMBULATORY_CARE_PROVIDER_SITE_OTHER): Payer: Self-pay | Admitting: Orthopedic Surgery

## 2016-05-24 NOTE — Telephone Encounter (Signed)
Pt asking where else she can go to get diabetic shoes as she said [facility] doesn't have them anymore.  Please advise [phone]

## 2016-05-25 NOTE — Telephone Encounter (Signed)
I called advise her to go to [facility].

## 2016-05-31 ENCOUNTER — Ambulatory Visit: Payer: Managed Care, Other (non HMO) | Admitting: Nurse Practitioner

## 2016-06-01 ENCOUNTER — Encounter: Payer: Self-pay | Admitting: Nurse Practitioner

## 2016-06-17 ENCOUNTER — Other Ambulatory Visit: Payer: Self-pay | Admitting: Family Medicine

## 2016-06-18 ENCOUNTER — Ambulatory Visit (INDEPENDENT_AMBULATORY_CARE_PROVIDER_SITE_OTHER): Payer: 59 | Admitting: Family

## 2016-06-18 VITALS — Ht 64.0 in | Wt 183.0 lb

## 2016-06-18 DIAGNOSIS — L97511 Non-pressure chronic ulcer of other part of right foot limited to breakdown of skin: Secondary | ICD-10-CM | POA: Diagnosis not present

## 2016-06-18 DIAGNOSIS — Z89431 Acquired absence of right foot: Secondary | ICD-10-CM | POA: Diagnosis not present

## 2016-06-18 DIAGNOSIS — E11621 Type 2 diabetes mellitus with foot ulcer: Secondary | ICD-10-CM | POA: Diagnosis not present

## 2016-06-18 NOTE — Progress Notes (Signed)
Office Visit Note   Patient: Judith [name]           Date of Birth: Nov 25, 1950           MRN: [id number] Visit Date: 06/18/2016              Requested by: [name], MD [id number] [place], [place] [address] PCP: Reginia Forts, MD  Chief Complaint  Patient presents with  . Right Foot - Follow-up    Right transmet amputation 02/13/16    HPI: Pt is full weight bearing in a NB shoe. Patient covers with band aid and states that "its closing up" there is an open area at the incision line and it has a spot amount of yellow drain. There is no redness and no odor or swelling. There is a slight amount of maceration. Pt does not report any pain. Pamella Pert, RMA  Patient is 66 year old woman who presents today in follow-up for ulceration to the lateral aspect of her transmetatarsal amputation on the right. 5 months postop. This ulceration has been very slow to heal. The patient would like to return to work. Is going to begin searching for some seated light duty work. She has been using a Band-Aid some gauze for packing with silvadene ointment dressing.    Assessment & Plan: Visit Diagnoses:  1. S/P transmetatarsal amputation of foot, right ([place])   2. Diabetic ulcer of right foot associated with type 2 diabetes mellitus, limited to breakdown of skin, unspecified part of foot ([place])     Plan: Continue with current wound care. Cleanse the wound daily. May minimally weight-bear on the right foot. Encouraged her to proceed with a return to work. Just we'll need to minimize weightbearing and pressure in her shoe wear against the transmetatarsal amputation. She'll follow up in 6 weeks   Physical Exam  Constitutional: Appears well-developed.  Head: Normocephalic.  Eyes: EOM are normal.  Neck: Normal range of motion.  Cardiovascular: Normal rate.   Pulmonary/Chest: Effort normal.  Neurological: Is alert.  Skin: Skin is warm.  Psychiatric: Has a normal mood and affect. Right foot: The  majority of the incision is well healed. Laterally there is a 14 mm x 3 mm ulceration the wound bed is flat and pink. This is about 2 mm deep. There is no probing no tract. No drainage on exam today. No surrounding erythema or maceration area and no odor no sign of infection.  Follow-Up Instructions: Return in about 6 weeks (around 07/30/2016).   Ortho Exam   Imaging: No results found.  Orders:  No orders of the defined types were placed in this encounter.  No orders of the defined types were placed in this encounter.    Procedures: No procedures performed  Clinical Data: No additional findings.  Subjective: Review of Systems  Constitutional: Negative for chills and fever.  Cardiovascular: Negative for leg swelling.  Skin: Positive for wound. Negative for color change.    Objective: Vital Signs: Ht 5\' 4"  (1.626 m)   Wt 183 lb (83 kg)   BMI 31.41 kg/m   Specialty Comments:  No specialty comments available.  PMFS History: Patient Active Problem List   Diagnosis Date Noted  . Diabetic ulcer of right foot associated with type 2 diabetes mellitus, limited to breakdown of skin ([place]) 06/18/2016  . SBO (small bowel obstruction) 05/10/2016  . S/P transmetatarsal amputation of foot, right ([place]) 04/19/2016  . Obstructive sleep apnea 11/26/2015  . Bilateral carpal tunnel syndrome 11/26/2015  .  Hypomagnesemia 11/16/2015  . Hyperlipidemia LDL goal <70 10/13/2015  . Abnormality of gait 09/02/2015  . Memory loss 08/12/2015  . Diabetic peripheral neuropathy ([place]) 08/12/2015  . Vitamin D deficiency 08/12/2015  . Hyperthyroidism 04/30/2015  . Heme positive stool   . Persistent atrial fibrillation ([place]) 04/29/2015  . Coronary artery disease involving native coronary artery of native heart without angina pectoris 03/29/2015  . Exertional angina ([place]) 03/06/2015  . Abnormal nuclear stress test   . Acute kidney injury ([place]) 02/12/2015  . History of goiter 09/28/2014  . GERD  (gastroesophageal reflux disease) 07/31/2013  . Depression with anxiety 06/01/2013  . DM2 (diabetes mellitus, type 2) ([place]) 01/30/2013  . HTN (hypertension) 01/30/2013  . Diabetic neuropathy, painful ([place]) 01/30/2013  . Tobacco user 01/30/2013   Past Medical History:  Diagnosis Date  . Abnormal EKG 07/31/2013  . Arthritis    "hands" (03/06/2015)  . Asthma   . Carpal tunnel syndrome, bilateral   . Chronic kidney disease   . Complication of anesthesia    slow to wake up  . Coronary artery disease     2 v CAD with CTO of the RCA and high grade bifurcational LCx/OM stenosis. S/P PCI DES x 2 to the LCx/OM.  Marland Kitchen Diabetic peripheral neuropathy ([place]) "since 1996"  . GERD (gastroesophageal reflux disease)   . Goiter   . Headache    migraines prior to menopause  . History of shingles 06/01/2013  . Hyperlipidemia LDL goal <70 10/13/2015  . Hypertension   . Hyperthyroidism   . PAF (paroxysmal atrial fibrillation) ([place]) 04/29/2015   CHADS2VASC score of 4 now on Apixaban  . Pneumonia ~ 1976  . Tremors of nervous system   . Type II diabetes mellitus (H[facility])    insulin dependent    Family History  Problem Relation Age of Onset  . Cancer Mother 92    bronchial cancer  . Breast cancer Mother   . Lung cancer Mother   . Hypertension Father   . COPD Father   . Heart disease Father 17    CAD with cardiac stenting  . Heart attack Father   . Parkinson's disease Father   . Allergies Sister   . Breast cancer Maternal Grandmother   . Emphysema Maternal Grandfather   . Leukemia Paternal Grandmother   . Emphysema Paternal Grandfather   . Thyroid disease Neg Hx     Past Surgical History:  Procedure Laterality Date  . ABDOMINAL HYSTERECTOMY  1988   age 23; CERVICAL DYSPLASIA; ovaries intact.   . AMPUTATION Right 01/23/2016   Procedure: Right 3rd Ray Amputation;  Surgeon: [name], MD;  Location: [facility];  Service: Orthopedics;  Laterality: Right;  . AMPUTATION Right 02/13/2016   Procedure:  Right Transmetatarsal Amputation;  Surgeon: [name], MD;  Location: [facility];  Service: Orthopedics;  Laterality: Right;  . CARDIAC CATHETERIZATION N/A 02/27/2015   Procedure: Left Heart Cath and Coronary Angiography;  Surgeon: [name], MD; LAD 40%, mCFX 80%, OM 70%, RCA 100% calcified       . CARDIAC CATHETERIZATION N/A 03/06/2015   Procedure: Coronary Stent Intervention;  Surgeon: [name], MD;  Location: [facility];  Service: Cardiovascular;  Laterality: N/A;  Mid CX 3.50x12 promus DES w/ 0% resdual and Prox OM1 2.50x20 promus DES w/ 20% residual  . CARPAL TUNNEL RELEASE Right Nov 2015  . CARPAL TUNNEL RELEASE Right 1992; 05/2014   [name]; [place]  . CESAREAN SECTION  1982; 1984  .  FOOT NEUROMA SURGERY Bilateral 2000  . KNEE ARTHROSCOPY Right ~ 2003   "meniscus repair"  . SHOULDER OPEN ROTATOR CUFF REPAIR Right 1996; 1998   "w/fracture repair"  . THYROID SURGERY  2000   "removed lots of nodules"  . TONSILLECTOMY  1976   Social History   Occupational History  . Food Therapist, nutritional    Social History Main Topics  . Smoking status: Former Smoker    Packs/day: 0.00    Years: 41.00    Types: Cigarettes    Quit date: 03/05/2015  . Smokeless tobacco: Never Used     Comment: 04/29/2015 "quit smoking cigarettes 02/27/2015"  . Alcohol use No  . Drug use: No  . Sexual activity: Not Currently    Birth control/ protection: Post-menopausal, Surgical

## 2016-07-20 ENCOUNTER — Ambulatory Visit (INDEPENDENT_AMBULATORY_CARE_PROVIDER_SITE_OTHER): Payer: 59 | Admitting: Physician Assistant

## 2016-07-20 VITALS — BP 142/80 | HR 90 | Temp 99.4°F | Resp 16 | Ht 64.0 in | Wt 182.2 lb

## 2016-07-20 DIAGNOSIS — M62838 Other muscle spasm: Secondary | ICD-10-CM

## 2016-07-20 DIAGNOSIS — G8929 Other chronic pain: Secondary | ICD-10-CM | POA: Diagnosis not present

## 2016-07-20 DIAGNOSIS — E1101 Type 2 diabetes mellitus with hyperosmolarity with coma: Secondary | ICD-10-CM | POA: Diagnosis not present

## 2016-07-20 DIAGNOSIS — Z794 Long term (current) use of insulin: Secondary | ICD-10-CM

## 2016-07-20 DIAGNOSIS — M546 Pain in thoracic spine: Secondary | ICD-10-CM | POA: Diagnosis not present

## 2016-07-20 DIAGNOSIS — Z89431 Acquired absence of right foot: Secondary | ICD-10-CM

## 2016-07-20 NOTE — Progress Notes (Signed)
Judith [name]  MRN: [id number] DOB: 02/20/1951  PCP: Reginia Forts, MD  Subjective:  Pt is a 66 year old female PMH HTN, CAD, GERD, HLD, OSA, Atrial fibbrilation, type 2 diabetes, AKI, carpal tunnel syndrome, depression, who presents to clinic for back pain.  She has been going to chiropractor x 2-3 weeks for this pain - was helping until muscle spasms worsened the past day or two, making her treatments impossible. She cannot lay on the roller bed due to pain from spasm. Pain is located in the middle of her back and radiates toward left breast - "this is how it always goes". Talking/laughing exacerbates the pain. Nothing triggered pain - no MOI.   She is using ice and heat daily four times a day.  She is taking Naprosyn, Ultram hs, Prozac and Wellbutrin. She is asking for steroid pack.  Denies saddle paresthesia, n/t LES, loss of bowel or bladder control.   H/o spinal stenosis and scoliosis. She has h/o back pain, however pain has never gotten this bad.   She has not been evaluated for back pain by ortho or neurology before. MRI several years ago in [name].   H/o diabetes - Dr. Loanne Drilling endocrinologist. Right foot amputation due to complications of diabeties 02/2016  Review of Systems  Constitutional: Negative for chills, fatigue and fever.  Respiratory: Negative for cough, shortness of breath and wheezing.   Cardiovascular: Negative for chest pain and palpitations.  Gastrointestinal: Negative for abdominal pain, diarrhea, nausea and vomiting.  Genitourinary: Negative for decreased urine volume, difficulty urinating, dysuria, enuresis, flank pain, frequency, hematuria and urgency.  Musculoskeletal: Positive for back pain.  Neurological: Negative for dizziness, weakness, light-headedness and headaches.    Patient Active Problem List   Diagnosis Date Noted  . Diabetic ulcer of right foot associated with type 2 diabetes mellitus, limited to breakdown of skin ([place]) 06/18/2016  . SBO  (small bowel obstruction) 05/10/2016  . S/P transmetatarsal amputation of foot, right ([place]) 04/19/2016  . Obstructive sleep apnea 11/26/2015  . Bilateral carpal tunnel syndrome 11/26/2015  . Hypomagnesemia 11/16/2015  . Hyperlipidemia LDL goal <70 10/13/2015  . Abnormality of gait 09/02/2015  . Memory loss 08/12/2015  . Diabetic peripheral neuropathy ([place]) 08/12/2015  . Vitamin D deficiency 08/12/2015  . Hyperthyroidism 04/30/2015  . Heme positive stool   . Persistent atrial fibrillation ([place]) 04/29/2015  . Coronary artery disease involving native coronary artery of native heart without angina pectoris 03/29/2015  . Exertional angina ([place]) 03/06/2015  . Abnormal nuclear stress test   . Acute kidney injury ([place]) 02/12/2015  . History of goiter 09/28/2014  . GERD (gastroesophageal reflux disease) 07/31/2013  . Depression with anxiety 06/01/2013  . DM2 (diabetes mellitus, type 2) ([place]) 01/30/2013  . HTN (hypertension) 01/30/2013  . Diabetic neuropathy, painful ([place]) 01/30/2013  . Tobacco user 01/30/2013    Current Outpatient Prescriptions on File Prior to Visit  Medication Sig Dispense Refill  . albuterol (PROVENTIL HFA) 108 (90 Base) MCG/ACT inhaler Inhale 1-2 puffs into the lungs every 6 (six) hours as needed for wheezing or shortness of breath. 1 Inhaler 0  . amLODipine (NORVASC) 5 MG tablet Take 1 tablet (5 mg total) by mouth daily. 30 tablet 11  . apixaban (ELIQUIS) 5 MG TABS tablet Take 1 tablet (5 mg total) by mouth 2 (two) times daily. 60 tablet 11  . atorvastatin (LIPITOR) 20 MG tablet Take 1 tablet (20 mg total) by mouth daily. 90 tablet 1  . buPROPion (WELLBUTRIN SR) 150 MG 12  hr tablet Take 1 tablet (150 mg total) by mouth 2 (two) times daily. 180 tablet 3  . clopidogrel (PLAVIX) 75 MG tablet TAKE ONE TABLET BY MOUTH ONCE DAILY (Patient taking differently: TAKE 75 MG BY MOUTH ONCE DAILY) 90 tablet 3  . cyclobenzaprine (FLEXERIL) 5 MG tablet TAKE ONE TABLET BY MOUTH THREE  TIMES DAILY AS NEEDED FOR MUSCLE SPASM 30 tablet 0  . FLUoxetine (PROZAC) 20 MG capsule Take 1 capsule (20 mg total) by mouth at bedtime. 90 capsule 3  . fluticasone (FLOVENT HFA) 44 MCG/ACT inhaler Inhale 2 puffs into the lungs 2 (two) times daily as needed (shortness of breath/ wheezing).     . gabapentin (NEURONTIN) 300 MG capsule TAKE 3 CAPSULES BY MOUTH AT morning AND 3 CAPSULES AT BEDTIME (Patient taking differently: Take 900 mg by mouth 2 (two) times daily. TAKE 3 CAPSULES BY MOUTH EVERY  MORNING AND 3 CAPSULES AT BEDTIME) 540 capsule 1  . insulin degludec (TRESIBA FLEXTOUCH) 100 UNIT/ML SOPN FlexTouch Pen Inject 0.2 mLs (20 Units total) into the skin at bedtime. 5 pen 11  . insulin lispro (HUMALOG) 100 UNIT/ML injection 3 times a day (just before each meal) 15-25-10 units (Patient taking differently: Inject into the skin 3 (three) times daily with meals. 3 times a day (just before each meal) 15 units EVERY MORNING -25 units EVERY AFTERNOON-10 units EVERY EVENING) 30 mL 11  . losartan (COZAAR) 50 MG tablet TAKE ONE TABLET BY MOUTH ONCE DAILY (Patient taking differently: TAKE 50 MG BY MOUTH ONCE DAILY) 90 tablet 3  . metFORMIN (GLUCOPHAGE-XR) 500 MG 24 hr tablet Take 4 tablets (2,000 mg total) by mouth daily with breakfast. (Patient taking differently: Take 500 mg by mouth 2 (two) times daily. ) 360 tablet 3  . methimazole (TAPAZOLE) 10 MG tablet Take 1 tablet (10 mg total) by mouth daily. 30 tablet 11  . metoprolol succinate (TOPROL-XL) 25 MG 24 hr tablet Take 1 tablet (25 mg total) by mouth 2 (two) times daily. Take with or immediately following a meal. 60 tablet 11  . nitroGLYCERIN (NITRODUR - DOSED IN MG/24 HR) 0.2 mg/hr patch Place 1 patch (0.2 mg total) onto the skin daily. Patch burns patient so she cuts it in half 30 patch 2  . ondansetron (ZOFRAN) 4 MG tablet Take 1 tablet (4 mg total) by mouth every 8 (eight) hours as needed for nausea or vomiting. 20 tablet 0  . pantoprazole (PROTONIX)  40 MG tablet Take 1 tablet (40 mg total) by mouth daily. 90 tablet 3  . silver sulfADIAZINE (SILVADENE) 1 % cream Apply 1 application topically daily. 400 g 0  . sotalol (BETAPACE) 80 MG tablet Take 1 tablet (80 mg total) by mouth every 12 (twelve) hours. 60 tablet 11  . traMADol (ULTRAM) 50 MG tablet Take 1 tablet (50 mg total) by mouth every 6 (six) hours as needed. Maximum dose= 8 tablets per day (Patient taking differently: Take 100 mg by mouth at bedtime. Maximum dose= 8 tablets per day) 60 tablet 0  . clindamycin (CLEOCIN) 300 MG capsule Take 300 mg by mouth See admin instructions. Take 1 capsule (300 mg) one hour prior to dental appointment - last taken 02/05/16    . nitroGLYCERIN (NITROSTAT) 0.4 MG SL tablet Place 1 tablet (0.4 mg total) under the tongue every 5 (five) minutes as needed for chest pain. (Patient not taking: Reported on 07/20/2016) 25 tablet 3   No current facility-administered medications on file prior to visit.  Allergies  Allergen Reactions  . Novolog [Insulin Aspart] Shortness Of Breath    "breathing problems"  . Codeine Nausea And Vomiting    HIGH DOSES-SEVERE VOMITING  . Iodine Other (See Comments)    MUST HAVE BENADRYL PRIOR TO PROCEDURE AND RIGHT BEFORE TREATMENT TO COUNTERACT REACTION-BLISTERING REACTION DERMATOLOGICAL  . Penicillins Itching, Rash and Other (See Comments)    Has patient had a PCN reaction causing immediate rash, facial/tongue/throat swelling, SOB or lightheadedness with hypotension: Yes Has patient had a PCN reaction causing severe rash involving mucus membranes or skin necrosis: No Has patient had a PCN reaction that required hospitalization No Has patient had a PCN reaction occurring within the last 10 years: No If all of the above answers are "NO", then may proceed with Cephalosporin use.  CHEST SIZED RASH AND ITCHING   . Propofol Other (See Comments)    "Breathing problems - asthma attack"  . Dilaudid [Hydromorphone Hcl]     HEADACHE   . Ace Inhibitors Cough  . Demerol [Meperidine] Nausea And Vomiting  . Neosporin [Neomycin-Bacitracin Zn-Polymyx] Itching and Rash    MAKES REACTIONS WORSE WHEN USING AS PROPHYLACTIC  . Percocet [Oxycodone-Acetaminophen] Rash  . Tape Itching and Rash     Objective:  BP (!) 142/80   Pulse 90   Temp 99.4 F (37.4 C) (Oral)   Resp 16   Ht 5\' 4"  (1.626 m)   Wt 182 lb 3.2 oz (82.6 kg)   SpO2 98%   BMI 31.27 kg/m   Physical Exam  Constitutional: She is oriented to person, place, and time and well-developed, well-nourished, and in no distress. No distress.  Cardiovascular: Normal rate, regular rhythm and normal heart sounds.   Musculoskeletal:       Thoracic back: She exhibits decreased range of motion, tenderness and spasm. She exhibits no bony tenderness.  Pt is standing through the duration of the interview due to pain. Decreased ROM with flexion and extension. Intermittent spasm while speaking/laughing.   Neurological: She is alert and oriented to person, place, and time. GCS score is 15.  Skin: Skin is warm and dry.  Psychiatric: Mood, memory, affect and judgment normal.  Vitals reviewed.   Assessment and Plan :  1. Chronic bilateral thoracic back pain 2. Muscle spasm 3. Type 2 diabetes mellitus with hyperosmolar coma, with long-term current use of insulin ([place]) 4. S/P transmetatarsal amputation of foot, right ([place]) - Pt presents for acute on chronic back pain. H/o stenosis and scoliosis. She has not been evaluated for back pain in several years - last eval was in [name]. She is taking Naprosyn and Ultram hs. Denied pt request for prednisone due to uncontrolled diabetes. Referral to orthopedics for work-up. She understands and agrees with plan.   Mercer Pod, PA-C  [facility] 07/20/2016 12:01 PM

## 2016-07-20 NOTE — Patient Instructions (Addendum)
[facility].  Address: [address], [place], [place] [address]   Thank you for coming in today. I hope you feel we met your needs.  Feel free to call [facility] if you have any questions or further requests.  Please consider signing up for MyChart if you do not already have it, as this is a great way to communicate with me.  Best,  Whitney McVey, PA-C   IF you received an x-ray today, you will receive an invoice from [facility]. Please contact [facility] at [phone] with questions or concerns regarding your invoice.   IF you received labwork today, you will receive an invoice from [facility]. Please contact [facility] at [phone] with questions or concerns regarding your invoice.   Our billing staff will not be able to assist you with questions regarding bills from these companies.  You will be contacted with the lab results as soon as they are available. The fastest way to get your results is to activate your My Chart account. Instructions are located on the last page of this paperwork. If you have not heard from us regarding the results in 2 weeks, please contact this office.      

## 2016-07-22 ENCOUNTER — Ambulatory Visit (INDEPENDENT_AMBULATORY_CARE_PROVIDER_SITE_OTHER): Payer: 59 | Admitting: Family

## 2016-07-22 ENCOUNTER — Other Ambulatory Visit: Payer: Self-pay | Admitting: Chiropractic Medicine

## 2016-07-22 DIAGNOSIS — Z89431 Acquired absence of right foot: Secondary | ICD-10-CM | POA: Diagnosis not present

## 2016-07-22 DIAGNOSIS — L02611 Cutaneous abscess of right foot: Secondary | ICD-10-CM

## 2016-07-22 DIAGNOSIS — E1142 Type 2 diabetes mellitus with diabetic polyneuropathy: Secondary | ICD-10-CM | POA: Diagnosis not present

## 2016-07-22 DIAGNOSIS — M5124 Other intervertebral disc displacement, thoracic region: Secondary | ICD-10-CM

## 2016-07-22 NOTE — Progress Notes (Signed)
Office Visit Note   Patient: Judith [name]           Date of Birth: 1950-09-24           MRN: [id number] Visit Date: 07/22/2016              Requested by: [name], MD [address], [place] [address] PCP: Reginia Forts, MD  No chief complaint on file.   HPI: Patient is 66 year old woman who presents today in follow-up for ulceration to the lateral aspect of her transmetatarsal amputation on the right. Surgery was 02/13/16. This ulceration has been very slow to heal.    She has been using a Band-Aid some gauze for packing with silvadene ointment dressing.  Noticed some bloody drainage on her sheets this morning. Was concerned for worsening to the ulceration.     Assessment & Plan: Visit Diagnoses:  1. S/P transmetatarsal amputation of foot, right ([place])     Plan: Continue with Silvadene dressings until healed. Activities as tolerated.   Follow-Up Instructions: Return in about 6 weeks (around 09/02/2016).   Ortho Exam Physical Exam  Constitutional: Appears well-developed.  Head: Normocephalic.  Eyes: EOM are normal.  Neck: Normal range of motion.  Cardiovascular: Normal rate.   Pulmonary/Chest: Effort normal.  Neurological: Is alert.  Skin: Skin is warm.  Psychiatric: Has a normal mood and affect. Right foot: Is status post transtmetatarsal amputation. This is much improved. Does have a spot of dried blood. Wound bed filled in with early epithelialization. No depth. No drainage. No surrounding erythema. No odor. No sign of infection.   Imaging: No results found.  Labs: Lab Results  Component Value Date   HGBA1C 5.8 03/16/2016   HGBA1C 8.8 01/15/2016   HGBA1C 9.2 10/15/2015   REPTSTATUS 02/14/2015 FINAL 02/12/2015   CULT  02/12/2015    >=100,000 COLONIES/mL GROUP B STREP(S.AGALACTIAE)ISOLATED TESTING AGAINST S. AGALACTIAE NOT ROUTINELY PERFORMED DUE TO PREDICTABILITY OF AMP/PEN/VAN SUSCEPTIBILITY.    LABORGA Multiple bacterial morphotypes present,  none 12/06/2014   LABORGA predominant. Suggest appropriate recollection if  12/06/2014   LABORGA clinically indicated. 12/06/2014    Orders:  No orders of the defined types were placed in this encounter.  No orders of the defined types were placed in this encounter.    Procedures: No procedures performed  Clinical Data: No additional findings.  Subjective: Review of Systems  Constitutional: Negative for chills and fever.  Skin: Positive for color change and wound.    Objective: Vital Signs: There were no vitals taken for this visit.  Specialty Comments:  No specialty comments available.  PMFS History: Patient Active Problem List   Diagnosis Date Noted  . Diabetic ulcer of right foot associated with type 2 diabetes mellitus, limited to breakdown of skin ([place]) 06/18/2016  . SBO (small bowel obstruction) 05/10/2016  . S/P transmetatarsal amputation of foot, right ([place]) 04/19/2016  . Obstructive sleep apnea 11/26/2015  . Bilateral carpal tunnel syndrome 11/26/2015  . Hypomagnesemia 11/16/2015  . Hyperlipidemia LDL goal <70 10/13/2015  . Abnormality of gait 09/02/2015  . Memory loss 08/12/2015  . Diabetic peripheral neuropathy ([place]) 08/12/2015  . Vitamin D deficiency 08/12/2015  . Hyperthyroidism 04/30/2015  . Heme positive stool   . Persistent atrial fibrillation ([place]) 04/29/2015  . Coronary artery disease involving native coronary artery of native heart without angina pectoris 03/29/2015  . Exertional angina ([place]) 03/06/2015  . Abnormal nuclear stress test   . Acute kidney injury ([place]) 02/12/2015  . History of goiter 09/28/2014  .  GERD (gastroesophageal reflux disease) 07/31/2013  . Depression with anxiety 06/01/2013  . DM2 (diabetes mellitus, type 2) ([place]) 01/30/2013  . HTN (hypertension) 01/30/2013  . Diabetic neuropathy, painful ([place]) 01/30/2013  . Tobacco user 01/30/2013   Past Medical History:  Diagnosis Date  . Abnormal EKG 07/31/2013  . Arthritis     "hands" (03/06/2015)  . Asthma   . Carpal tunnel syndrome, bilateral   . Chronic kidney disease   . Complication of anesthesia    slow to wake up  . Coronary artery disease     2 v CAD with CTO of the RCA and high grade bifurcational LCx/OM stenosis. S/P PCI DES x 2 to the LCx/OM.  Marland Kitchen Diabetic peripheral neuropathy ([place]) "since 1996"  . GERD (gastroesophageal reflux disease)   . Goiter   . Headache    migraines prior to menopause  . History of shingles 06/01/2013  . Hyperlipidemia LDL goal <70 10/13/2015  . Hypertension   . Hyperthyroidism   . PAF (paroxysmal atrial fibrillation) ([place]) 04/29/2015   CHADS2VASC score of 4 now on Apixaban  . Pneumonia ~ 1976  . Tremors of nervous system   . Type II diabetes mellitus (HCC)    insulin dependent    Family History  Problem Relation Age of Onset  . Cancer Mother 66    bronchial cancer  . Breast cancer Mother   . Lung cancer Mother   . Hypertension Father   . COPD Father   . Heart disease Father 75    CAD with cardiac stenting  . Heart attack Father   . Parkinson's disease Father   . Allergies Sister   . Breast cancer Maternal Grandmother   . Emphysema Maternal Grandfather   . Leukemia Paternal Grandmother   . Emphysema Paternal Grandfather   . Thyroid disease Neg Hx     Past Surgical History:  Procedure Laterality Date  . ABDOMINAL HYSTERECTOMY  1988   age 15; CERVICAL DYSPLASIA; ovaries intact.   . AMPUTATION Right 01/23/2016   Procedure: Right 3rd Ray Amputation;  Surgeon: [name], MD;  Location: [facility];  Service: Orthopedics;  Laterality: Right;  . AMPUTATION Right 02/13/2016   Procedure: Right Transmetatarsal Amputation;  Surgeon: [name], MD;  Location: [facility];  Service: Orthopedics;  Laterality: Right;  . CARDIAC CATHETERIZATION N/A 02/27/2015   Procedure: Left Heart Cath and Coronary Angiography;  Surgeon: [name], MD; LAD 40%, mCFX 80%, OM 70%, RCA 100% calcified       . CARDIAC CATHETERIZATION N/A  03/06/2015   Procedure: Coronary Stent Intervention;  Surgeon: [name], MD;  Location: [facility];  Service: Cardiovascular;  Laterality: N/A;  Mid CX 3.50x12 promus DES w/ 0% resdual and Prox OM1 2.50x20 promus DES w/ 20% residual  . CARPAL TUNNEL RELEASE Right Nov 2015  . CARPAL TUNNEL RELEASE Right 1992; 05/2014   [name]; [place]  . CESAREAN SECTION  1982; 1984  . FOOT NEUROMA SURGERY Bilateral 2000  . KNEE ARTHROSCOPY Right ~ 2003   "meniscus repair"  . SHOULDER OPEN ROTATOR CUFF REPAIR Right 1996; 1998   "w/fracture repair"  . THYROID SURGERY  2000   "removed lots of nodules"  . TONSILLECTOMY  1976   Social History   Occupational History  . Food Therapist, nutritional    Social History Main Topics  . Smoking status: Former Smoker    Packs/day: 0.00    Years: 41.00    Types: Cigarettes    Quit date:  03/05/2015  . Smokeless tobacco: Never Used     Comment: 04/29/2015 "quit smoking cigarettes 02/27/2015"  . Alcohol use No  . Drug use: No  . Sexual activity: Not Currently    Birth control/ protection: Post-menopausal, Surgical

## 2016-07-27 ENCOUNTER — Other Ambulatory Visit: Payer: Self-pay

## 2016-07-27 ENCOUNTER — Inpatient Hospital Stay: Admission: RE | Admit: 2016-07-27 | Payer: Self-pay | Source: Ambulatory Visit

## 2016-07-27 ENCOUNTER — Ambulatory Visit (INDEPENDENT_AMBULATORY_CARE_PROVIDER_SITE_OTHER): Payer: 59 | Admitting: Urgent Care

## 2016-07-27 VITALS — BP 138/75 | HR 91 | Temp 98.9°F | Resp 17 | Ht 64.5 in | Wt 182.0 lb

## 2016-07-27 DIAGNOSIS — M5410 Radiculopathy, site unspecified: Secondary | ICD-10-CM

## 2016-07-27 DIAGNOSIS — M546 Pain in thoracic spine: Secondary | ICD-10-CM

## 2016-07-27 DIAGNOSIS — G8929 Other chronic pain: Secondary | ICD-10-CM | POA: Diagnosis not present

## 2016-07-27 NOTE — Progress Notes (Signed)
  MRN: [id number] DOB: 08-20-1950  Subjective:   Judith [name] is a 66 y.o. female presenting for chief complaint of Back Pain  Reports 3 month history of worsening mid-back pain. Pain has progressed to constant, severe, burning, radiating sensation from her back to her left flank, abdominal pain. Has been working very consistently with a chiropractor, accupunturist with minimal relief. She did get a referral for an MRI of her thoracic spine but her insurance declined because they need it to come from [place]. Denies trauma, falls. Has significant difficulty with her range of motion. She has allergies to contrast. Denies having pacemaker or implanted medical device.  Ismael has a current medication list which includes the following prescription(s): albuterol, amlodipine, apixaban, atorvastatin, bupropion, clindamycin, clopidogrel, cyclobenzaprine, fluoxetine, fluticasone, gabapentin, insulin degludec, insulin lispro, losartan, metformin, methimazole, metoprolol succinate, nitroglycerin, nitroglycerin, ondansetron, pantoprazole, silver sulfadiazine, sotalol, and tramadol. Also is allergic to novolog [insulin aspart]; codeine; iodine; penicillins; propofol; dilaudid [hydromorphone hcl]; ace inhibitors; demerol [meperidine]; neosporin [neomycin-bacitracin zn-polymyx]; percocet [oxycodone-acetaminophen]; and tape.  Tasheka  has a past medical history of Abnormal EKG (07/31/2013); Arthritis; Asthma; Carpal tunnel syndrome, bilateral; Chronic kidney disease; Complication of anesthesia; Coronary artery disease; Diabetic peripheral neuropathy (HCC) ("since 1996"); GERD (gastroesophageal reflux disease); Goiter; Headache; History of shingles (06/01/2013); Hyperlipidemia LDL goal <70 (10/13/2015); Hypertension; Hyperthyroidism; PAF (paroxysmal atrial fibrillation) ([place]) (04/29/2015); Pneumonia (~ 1976); Tremors of nervous system; and Type II diabetes mellitus ([place]). Also  has a past surgical history that includes Tonsillectomy  (1976); Cesarean section (6759; 1984); Carpal tunnel release (Right, Nov 2015); Knee arthroscopy (Right, ~ 2003); Shoulder open rotator cuff repair (Right, 1996; 1998); Foot neuroma surgery (Bilateral, 2000); Carpal tunnel release (Right, 1992; 05/2014); Thyroid surgery (2000); Cardiac catheterization (N/A, 02/27/2015); Cardiac catheterization (N/A, 03/06/2015); Abdominal hysterectomy (1988); Amputation (Right, 01/23/2016); and Amputation (Right, 02/13/2016).  Objective:   Vitals: BP 138/75   Pulse 91   Temp 98.9 F (37.2 C) (Oral)   Resp 17   Ht 5' 4.5" (1.638 m)   Wt 182 lb (82.6 kg)   SpO2 97%   BMI 30.76 kg/m   Physical Exam  Constitutional: She is oriented to person, place, and time. She appears well-developed and well-nourished.  Cardiovascular: Normal rate.   Pulmonary/Chest: Effort normal.  Musculoskeletal: She exhibits no edema.       Thoracic back: She exhibits decreased range of motion and tenderness (over area depicted). She exhibits no bony tenderness, no swelling, no edema, no deformity and no spasm.       Back:  Neurological: She is alert and oriented to person, place, and time. Coordination (patient is ambulating favoring her back, using her thighs to gingerly rise from a sitting position) abnormal.   Assessment and Plan :   1. Chronic bilateral thoracic back pain 2. Radicular pain - Referral placed for MRI. She had one scheduled today, we will try to get this appointment back. Patient is aware. The interpretation from her x-ray which recommends MRI of thoracic vertebra T10-T12 will be scanned. Also shows generalized osteoporosis, mild spondylosis of C4/5, C6/7, mid-lower thoracic vertebra. Also has flattening of lordosis at thoracic level.   [name], PA-C [facility] [phone] 07/27/2016  10:35 AM

## 2016-07-27 NOTE — Patient Instructions (Addendum)
Back Pain, Adult Back pain is very common in adults.The cause of back pain is rarely dangerous and the pain often gets better over time.The cause of your back pain may not be known. Some common causes of back pain include:  Strain of the muscles or ligaments supporting the spine.  Wear and tear (degeneration) of the spinal disks.  Arthritis.  Direct injury to the back. For many people, back pain may return. Since back pain is rarely dangerous, most people can learn to manage this condition on their own. Follow these instructions at home: Watch your back pain for any changes. The following actions may help to lessen any discomfort you are feeling:  Remain active. It is stressful on your back to sit or stand in one place for long periods of time. Do not sit, drive, or stand in one place for more than 30 minutes at a time. Take short walks on even surfaces as soon as you are able.Try to increase the length of time you walk each day.  Exercise regularly as directed by your health care provider. Exercise helps your back heal faster. It also helps avoid future injury by keeping your muscles strong and flexible.  Do not stay in bed.Resting more than 1-2 days can delay your recovery.  Pay attention to your body when you bend and lift. The most comfortable positions are those that put less stress on your recovering back. Always use proper lifting techniques, including:  Bending your knees.  Keeping the load close to your body.  Avoiding twisting.  Find a comfortable position to sleep. Use a firm mattress and lie on your side with your knees slightly bent. If you lie on your back, put a pillow under your knees.  Avoid feeling anxious or stressed.Stress increases muscle tension and can worsen back pain.It is important to recognize when you are anxious or stressed and learn ways to manage it, such as with exercise.  Take medicines only as directed by your health care provider.  Over-the-counter medicines to reduce pain and inflammation are often the most helpful.Your health care provider may prescribe muscle relaxant drugs.These medicines help dull your pain so you can more quickly return to your normal activities and healthy exercise.  Apply ice to the injured area:  Put ice in a plastic bag.  Place a towel between your skin and the bag.  Leave the ice on for 20 minutes, 2-3 times a day for the first 2-3 days. After that, ice and heat may be alternated to reduce pain and spasms.  Maintain a healthy weight. Excess weight puts extra stress on your back and makes it difficult to maintain good posture. Contact a health care provider if:  You have pain that is not relieved with rest or medicine.  You have increasing pain going down into the legs or buttocks.  You have pain that does not improve in one week.  You have night pain.  You lose weight.  You have a fever or chills. Get help right away if:  You develop new bowel or bladder control problems.  You have unusual weakness or numbness in your arms or legs.  You develop nausea or vomiting.  You develop abdominal pain.  You feel faint. This information is not intended to replace advice given to you by your health care provider. Make sure you discuss any questions you have with your health care provider. Document Released: 04/26/2005 Document Revised: 09/04/2015 Document Reviewed: 08/28/2013 Elsevier Interactive Patient Education  2017 Elsevier   Inc.     IF you received an x-ray today, you will receive an invoice from [facility]. Please contact [facility] at [phone] with questions or concerns regarding your invoice.   IF you received labwork today, you will receive an invoice from [facility]. Please contact [facility] at [phone] with questions or concerns regarding your invoice.   Our billing staff will not be able to assist you with questions regarding bills from these  companies.  You will be contacted with the lab results as soon as they are available. The fastest way to get your results is to activate your My Chart account. Instructions are located on the last page of this paperwork. If you have not heard from [place] regarding the results in 2 weeks, please contact this office.

## 2016-07-28 ENCOUNTER — Other Ambulatory Visit: Payer: Self-pay | Admitting: Physician Assistant

## 2016-07-28 ENCOUNTER — Ambulatory Visit (INDEPENDENT_AMBULATORY_CARE_PROVIDER_SITE_OTHER): Payer: Managed Care, Other (non HMO) | Admitting: Family

## 2016-07-28 NOTE — Telephone Encounter (Signed)
06/17/16 last refill

## 2016-08-04 ENCOUNTER — Telehealth: Payer: Self-pay | Admitting: Urgent Care

## 2016-08-04 NOTE — Telephone Encounter (Signed)
Pt is scheduled for MRI on Saturday. Insurance has denied. Case number [id number]. Provider can call [phone] to do a peer to peer, or I can cancel pt's imaging if we decide to take another course of action. Thank you!

## 2016-08-05 NOTE — Telephone Encounter (Signed)
Discussed options with patient, she will come in on 08/06/2016 for evaluation/consideration of short steroid course.

## 2016-08-06 ENCOUNTER — Ambulatory Visit: Payer: 59

## 2016-08-06 ENCOUNTER — Observation Stay (HOSPITAL_COMMUNITY)
Admission: EM | Admit: 2016-08-06 | Discharge: 2016-08-07 | Disposition: A | Discharge status: Home / Self Care | Payer: 59 | Attending: Cardiology | Admitting: Cardiology

## 2016-08-06 ENCOUNTER — Emergency Department (HOSPITAL_COMMUNITY): Payer: 59

## 2016-08-06 ENCOUNTER — Encounter (HOSPITAL_COMMUNITY): Payer: Self-pay

## 2016-08-06 DIAGNOSIS — E559 Vitamin D deficiency, unspecified: Secondary | ICD-10-CM | POA: Insufficient documentation

## 2016-08-06 DIAGNOSIS — E1122 Type 2 diabetes mellitus with diabetic chronic kidney disease: Secondary | ICD-10-CM | POA: Insufficient documentation

## 2016-08-06 DIAGNOSIS — I25118 Atherosclerotic heart disease of native coronary artery with other forms of angina pectoris: Secondary | ICD-10-CM | POA: Diagnosis present

## 2016-08-06 DIAGNOSIS — I480 Paroxysmal atrial fibrillation: Secondary | ICD-10-CM

## 2016-08-06 DIAGNOSIS — Z880 Allergy status to penicillin: Secondary | ICD-10-CM | POA: Diagnosis not present

## 2016-08-06 DIAGNOSIS — E1142 Type 2 diabetes mellitus with diabetic polyneuropathy: Secondary | ICD-10-CM | POA: Diagnosis not present

## 2016-08-06 DIAGNOSIS — Z89431 Acquired absence of right foot: Secondary | ICD-10-CM | POA: Diagnosis not present

## 2016-08-06 DIAGNOSIS — E860 Dehydration: Secondary | ICD-10-CM | POA: Insufficient documentation

## 2016-08-06 DIAGNOSIS — Z794 Long term (current) use of insulin: Secondary | ICD-10-CM | POA: Diagnosis not present

## 2016-08-06 DIAGNOSIS — I4891 Unspecified atrial fibrillation: Secondary | ICD-10-CM | POA: Diagnosis present

## 2016-08-06 DIAGNOSIS — I481 Persistent atrial fibrillation: Secondary | ICD-10-CM | POA: Diagnosis not present

## 2016-08-06 DIAGNOSIS — Z87891 Personal history of nicotine dependence: Secondary | ICD-10-CM | POA: Insufficient documentation

## 2016-08-06 DIAGNOSIS — J45909 Unspecified asthma, uncomplicated: Secondary | ICD-10-CM | POA: Diagnosis not present

## 2016-08-06 DIAGNOSIS — Z955 Presence of coronary angioplasty implant and graft: Secondary | ICD-10-CM | POA: Insufficient documentation

## 2016-08-06 DIAGNOSIS — E0500 Thyrotoxicosis with diffuse goiter without thyrotoxic crisis or storm: Secondary | ICD-10-CM | POA: Diagnosis not present

## 2016-08-06 DIAGNOSIS — E0590 Thyrotoxicosis, unspecified without thyrotoxic crisis or storm: Secondary | ICD-10-CM | POA: Diagnosis present

## 2016-08-06 DIAGNOSIS — Z8249 Family history of ischemic heart disease and other diseases of the circulatory system: Secondary | ICD-10-CM | POA: Insufficient documentation

## 2016-08-06 DIAGNOSIS — K219 Gastro-esophageal reflux disease without esophagitis: Secondary | ICD-10-CM | POA: Diagnosis not present

## 2016-08-06 DIAGNOSIS — I129 Hypertensive chronic kidney disease with stage 1 through stage 4 chronic kidney disease, or unspecified chronic kidney disease: Secondary | ICD-10-CM | POA: Diagnosis not present

## 2016-08-06 DIAGNOSIS — N189 Chronic kidney disease, unspecified: Secondary | ICD-10-CM | POA: Insufficient documentation

## 2016-08-06 DIAGNOSIS — Z885 Allergy status to narcotic agent status: Secondary | ICD-10-CM | POA: Diagnosis not present

## 2016-08-06 DIAGNOSIS — Z888 Allergy status to other drugs, medicaments and biological substances status: Secondary | ICD-10-CM | POA: Insufficient documentation

## 2016-08-06 DIAGNOSIS — E1151 Type 2 diabetes mellitus with diabetic peripheral angiopathy without gangrene: Secondary | ICD-10-CM

## 2016-08-06 DIAGNOSIS — I2510 Atherosclerotic heart disease of native coronary artery without angina pectoris: Secondary | ICD-10-CM | POA: Insufficient documentation

## 2016-08-06 DIAGNOSIS — Z7901 Long term (current) use of anticoagulants: Secondary | ICD-10-CM | POA: Insufficient documentation

## 2016-08-06 DIAGNOSIS — Z7902 Long term (current) use of antithrombotics/antiplatelets: Secondary | ICD-10-CM | POA: Insufficient documentation

## 2016-08-06 DIAGNOSIS — G4733 Obstructive sleep apnea (adult) (pediatric): Secondary | ICD-10-CM | POA: Diagnosis not present

## 2016-08-06 DIAGNOSIS — E785 Hyperlipidemia, unspecified: Secondary | ICD-10-CM | POA: Diagnosis present

## 2016-08-06 DIAGNOSIS — I10 Essential (primary) hypertension: Secondary | ICD-10-CM | POA: Diagnosis present

## 2016-08-06 LAB — TSH: TSH: 1.471 u[IU]/mL (ref 0.350–4.500)

## 2016-08-06 LAB — CBC
HCT: 36.4 % (ref 36.0–46.0)
HEMOGLOBIN: 11.7 g/dL — AB (ref 12.0–15.0)
MCH: 28 pg (ref 26.0–34.0)
MCHC: 32.1 g/dL (ref 30.0–36.0)
MCV: 87.1 fL (ref 78.0–100.0)
PLATELETS: 374 10*3/uL (ref 150–400)
RBC: 4.18 MIL/uL (ref 3.87–5.11)
RDW: 13.8 % (ref 11.5–15.5)
WBC: 10.1 10*3/uL (ref 4.0–10.5)

## 2016-08-06 LAB — BASIC METABOLIC PANEL
Anion gap: 14 (ref 5–15)
BUN: 15 mg/dL (ref 6–20)
CO2: 27 mmol/L (ref 22–32)
CREATININE: 0.81 mg/dL (ref 0.44–1.00)
Calcium: 8.2 mg/dL — ABNORMAL LOW (ref 8.9–10.3)
Chloride: 97 mmol/L — ABNORMAL LOW (ref 101–111)
GFR calc non Af Amer: >60 mL/min (ref >60)
Glucose, Bld: 228 mg/dL — ABNORMAL HIGH (ref 65–99)
Potassium: 3.5 mmol/L (ref 3.5–5.1)
Sodium: 138 mmol/L (ref 135–145)

## 2016-08-06 LAB — I-STAT TROPONIN, ED: Troponin i, poc: 0.02 ng/mL (ref 0.00–0.08)

## 2016-08-06 LAB — CBG MONITORING, ED
GLUCOSE-CAPILLARY: 217 mg/dL — AB (ref 65–99)
GLUCOSE-CAPILLARY: 280 mg/dL — AB (ref 65–99)

## 2016-08-06 LAB — GLUCOSE, CAPILLARY: Glucose-Capillary: 329 mg/dL — ABNORMAL HIGH (ref 65–99)

## 2016-08-06 MED ORDER — DILTIAZEM LOAD VIA INFUSION
20.0000 mg | Freq: Once | INTRAVENOUS | Status: AC
  Administered 2016-08-06: 20 mg via INTRAVENOUS
  Filled 2016-08-06: qty 20

## 2016-08-06 MED ORDER — INSULIN ASPART 100 UNIT/ML ~~LOC~~ SOLN
0.0000 [IU] | Freq: Three times a day (TID) | SUBCUTANEOUS | Status: DC
Start: 2016-08-06 — End: 2016-08-06

## 2016-08-06 MED ORDER — FENTANYL CITRATE (PF) 100 MCG/2ML IJ SOLN
50.0000 ug | Freq: Once | INTRAMUSCULAR | Status: AC
  Administered 2016-08-06: 50 ug via INTRAVENOUS
  Filled 2016-08-06: qty 2

## 2016-08-06 MED ORDER — AMIODARONE HCL IN DEXTROSE 360-4.14 MG/200ML-% IV SOLN
INTRAVENOUS | Status: AC
  Filled 2016-08-06: qty 200

## 2016-08-06 MED ORDER — LOSARTAN POTASSIUM 50 MG PO TABS
50.0000 mg | ORAL_TABLET | Freq: Every day | ORAL | Status: DC
  Administered 2016-08-06 – 2016-08-07 (×2): 50 mg via ORAL
  Filled 2016-08-06 (×2): qty 1

## 2016-08-06 MED ORDER — AMIODARONE HCL IN DEXTROSE 360-4.14 MG/200ML-% IV SOLN
60.0000 mg/h | Freq: Once | INTRAVENOUS | Status: AC
  Administered 2016-08-06: 60 mg/h via INTRAVENOUS
  Filled 2016-08-06: qty 200

## 2016-08-06 MED ORDER — ONDANSETRON HCL 4 MG PO TABS: 4.0000 mg | ORAL_TABLET | Freq: Three times a day (TID) | ORAL | Status: DC | PRN

## 2016-08-06 MED ORDER — GABAPENTIN 300 MG PO CAPS
900.0000 mg | ORAL_CAPSULE | Freq: Two times a day (BID) | ORAL | Status: DC
  Administered 2016-08-06 – 2016-08-07 (×2): 900 mg via ORAL
  Filled 2016-08-06 (×2): qty 3

## 2016-08-06 MED ORDER — PANTOPRAZOLE SODIUM 40 MG PO TBEC
40.0000 mg | DELAYED_RELEASE_TABLET | Freq: Every day | ORAL | Status: DC
  Administered 2016-08-07: 40 mg via ORAL
  Filled 2016-08-06: qty 1

## 2016-08-06 MED ORDER — METOPROLOL SUCCINATE ER 25 MG PO TB24
25.0000 mg | ORAL_TABLET | Freq: Two times a day (BID) | ORAL | Status: DC
  Administered 2016-08-06 – 2016-08-07 (×2): 25 mg via ORAL
  Filled 2016-08-06 (×2): qty 1

## 2016-08-06 MED ORDER — ONDANSETRON HCL 4 MG/2ML IJ SOLN
4.0000 mg | Freq: Once | INTRAMUSCULAR | Status: AC
  Administered 2016-08-06: 4 mg via INTRAVENOUS

## 2016-08-06 MED ORDER — ETOMIDATE 2 MG/ML IV SOLN
0.1500 mg/kg | Freq: Once | INTRAVENOUS | Status: AC
  Administered 2016-08-06: 11.92 mg via INTRAVENOUS
  Filled 2016-08-06: qty 10

## 2016-08-06 MED ORDER — DIPHENHYDRAMINE HCL 50 MG/ML IJ SOLN
12.5000 mg | Freq: Once | INTRAMUSCULAR | Status: DC
  Filled 2016-08-06: qty 1

## 2016-08-06 MED ORDER — DILTIAZEM HCL-DEXTROSE 100-5 MG/100ML-% IV SOLN (PREMIX)
5.0000 mg/h | INTRAVENOUS | Status: DC
  Administered 2016-08-06: 5 mg/h via INTRAVENOUS
  Administered 2016-08-06: 10 mg/h via INTRAVENOUS
  Filled 2016-08-06 (×2): qty 100

## 2016-08-06 MED ORDER — CLOPIDOGREL BISULFATE 75 MG PO TABS
75.0000 mg | ORAL_TABLET | Freq: Every day | ORAL | Status: DC
  Administered 2016-08-06 – 2016-08-07 (×2): 75 mg via ORAL
  Filled 2016-08-06 (×2): qty 1

## 2016-08-06 MED ORDER — ALBUTEROL SULFATE (2.5 MG/3ML) 0.083% IN NEBU
2.5000 mg | INHALATION_SOLUTION | Freq: Four times a day (QID) | RESPIRATORY_TRACT | Status: DC | PRN
Start: 2016-08-06 — End: 2016-08-07

## 2016-08-06 MED ORDER — SODIUM CHLORIDE 0.9 % IV BOLUS (SEPSIS)
500.0000 mL | Freq: Once | INTRAVENOUS | Status: AC
  Administered 2016-08-06: 500 mL via INTRAVENOUS

## 2016-08-06 MED ORDER — ATORVASTATIN CALCIUM 20 MG PO TABS
20.0000 mg | ORAL_TABLET | Freq: Every day | ORAL | Status: DC
  Administered 2016-08-07: 20 mg via ORAL
  Filled 2016-08-06: qty 1

## 2016-08-06 MED ORDER — AMLODIPINE BESYLATE 5 MG PO TABS
5.0000 mg | ORAL_TABLET | Freq: Every day | ORAL | Status: DC
  Administered 2016-08-06 – 2016-08-07 (×2): 5 mg via ORAL
  Filled 2016-08-06 (×2): qty 1

## 2016-08-06 MED ORDER — METHIMAZOLE 5 MG PO TABS
10.0000 mg | ORAL_TABLET | Freq: Every day | ORAL | Status: DC
  Administered 2016-08-06 – 2016-08-07 (×2): 10 mg via ORAL
  Filled 2016-08-06 (×2): qty 2

## 2016-08-06 MED ORDER — BUPROPION HCL ER (SR) 150 MG PO TB12
150.0000 mg | ORAL_TABLET | Freq: Two times a day (BID) | ORAL | Status: DC
  Administered 2016-08-06 – 2016-08-07 (×2): 150 mg via ORAL
  Filled 2016-08-06 (×2): qty 1

## 2016-08-06 MED ORDER — NITROGLYCERIN 0.4 MG SL SUBL: 0.4000 mg | SUBLINGUAL_TABLET | SUBLINGUAL | Status: DC | PRN

## 2016-08-06 MED ORDER — ACETAMINOPHEN 325 MG PO TABS: 650.0000 mg | ORAL_TABLET | ORAL | Status: DC | PRN

## 2016-08-06 MED ORDER — ONDANSETRON HCL 4 MG/2ML IJ SOLN
INTRAMUSCULAR | Status: AC
Start: 2016-08-06 — End: 2016-08-06
  Filled 2016-08-06: qty 2

## 2016-08-06 MED ORDER — APIXABAN 5 MG PO TABS
5.0000 mg | ORAL_TABLET | Freq: Two times a day (BID) | ORAL | Status: DC
  Administered 2016-08-06 – 2016-08-07 (×2): 5 mg via ORAL
  Filled 2016-08-06 (×2): qty 1

## 2016-08-06 MED ORDER — ONDANSETRON HCL 4 MG/2ML IJ SOLN: 4.0000 mg | Freq: Four times a day (QID) | INTRAMUSCULAR | Status: DC | PRN

## 2016-08-06 MED ORDER — AMIODARONE HCL IN DEXTROSE 360-4.14 MG/200ML-% IV SOLN
60.0000 mg/h | Freq: Once | INTRAVENOUS | Status: AC
  Administered 2016-08-06: 60 mg/h via INTRAVENOUS

## 2016-08-06 MED ORDER — APIXABAN 5 MG PO TABS: 5.0000 mg | ORAL_TABLET | Freq: Two times a day (BID) | ORAL | Status: DC

## 2016-08-06 MED ORDER — FLUOXETINE HCL 20 MG PO CAPS
20.0000 mg | ORAL_CAPSULE | Freq: Every day | ORAL | Status: DC
  Administered 2016-08-06: 20 mg via ORAL
  Filled 2016-08-06: qty 1

## 2016-08-06 MED ORDER — SOTALOL HCL 80 MG PO TABS
80.0000 mg | ORAL_TABLET | Freq: Two times a day (BID) | ORAL | Status: DC
  Administered 2016-08-06 – 2016-08-07 (×2): 80 mg via ORAL
  Filled 2016-08-06 (×3): qty 1

## 2016-08-06 MED ORDER — DIPHENHYDRAMINE HCL 50 MG/ML IJ SOLN
12.5000 mg | Freq: Once | INTRAMUSCULAR | Status: AC
  Administered 2016-08-06: 12.5 mg via INTRAVENOUS

## 2016-08-06 MED ORDER — INSULIN LISPRO 100 UNIT/ML (KWIKPEN)
0.0000 [IU] | PEN_INJECTOR | Freq: Three times a day (TID) | SUBCUTANEOUS | Status: DC
  Administered 2016-08-06: 7 [IU] via SUBCUTANEOUS
  Administered 2016-08-06 – 2016-08-07 (×2): 5 [IU] via SUBCUTANEOUS
  Administered 2016-08-07: 2 [IU] via SUBCUTANEOUS
  Filled 2016-08-06: qty 3

## 2016-08-06 MED ORDER — TRAMADOL HCL 50 MG PO TABS: 50.0000 mg | ORAL_TABLET | Freq: Four times a day (QID) | ORAL | Status: DC | PRN

## 2016-08-06 NOTE — ED Notes (Signed)
Ordered heart healthy tray  
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
## 2016-08-06 NOTE — ED Notes (Signed)
Call x-ray when pt is ready to have the scan.

## 2016-08-06 NOTE — ED Notes (Signed)
Pt shocked again at 200j, heart rate decreased to 90s.  Pt tolerated well.

## 2016-08-06 NOTE — ED Notes (Signed)
Cards at bedside

## 2016-08-06 NOTE — ED Notes (Signed)
Pt prepped for cardioversion, Dr Eulis Foster at bedside.  Pads placed, time out done.

## 2016-08-06 NOTE — ED Triage Notes (Signed)
Pt arrives from home via EMS for chief complaint of palpitations as per EMS ekg pt has runs of VTach for 5-6 beats and then her heart rate goes back to a NSR pt denies CP though has had some nausea received 8 mg IV Zofran

## 2016-08-06 NOTE — ED Notes (Signed)
Pt in room on monitor appears to be in an SV[facility] ER MD made aware

## 2016-08-06 NOTE — ED Notes (Signed)
Attempted report x1. 

## 2016-08-06 NOTE — ED Notes (Addendum)
Pt shocked at 200j.  Heart rate decreased to 100 and elevated back up to 180s.

## 2016-08-07 ENCOUNTER — Inpatient Hospital Stay: Admission: RE | Admit: 2016-08-07 | Payer: Self-pay | Source: Ambulatory Visit

## 2016-08-07 DIAGNOSIS — I10 Essential (primary) hypertension: Secondary | ICD-10-CM

## 2016-08-07 DIAGNOSIS — I2510 Atherosclerotic heart disease of native coronary artery without angina pectoris: Secondary | ICD-10-CM

## 2016-08-07 DIAGNOSIS — E1165 Type 2 diabetes mellitus with hyperglycemia: Secondary | ICD-10-CM

## 2016-08-07 DIAGNOSIS — I480 Paroxysmal atrial fibrillation: Secondary | ICD-10-CM | POA: Diagnosis not present

## 2016-08-07 LAB — HIV ANTIBODY (ROUTINE TESTING W REFLEX): HIV SCREEN 4TH GENERATION: NONREACTIVE

## 2016-08-07 LAB — GLUCOSE, CAPILLARY
GLUCOSE-CAPILLARY: 192 mg/dL — AB (ref 65–99)
Glucose-Capillary: 255 mg/dL — ABNORMAL HIGH (ref 65–99)

## 2016-08-07 MED ORDER — POTASSIUM CHLORIDE CRYS ER 20 MEQ PO TBCR
20.0000 meq | EXTENDED_RELEASE_TABLET | Freq: Once | ORAL | Status: AC
  Administered 2016-08-07: 20 meq via ORAL
  Filled 2016-08-07: qty 1

## 2016-08-07 NOTE — Progress Notes (Signed)
    Subjective:  Denies SSCP, palpitations or Dyspnea Wants to go home   Objective:  Vitals:   08/06/16 1849 08/06/16 1945 08/06/16 2013 08/07/16 0431  BP: (!) 161/79 (!) 155/75 (!) 173/74 (!) 157/65  Pulse: 85 81 80 74  Resp: 16 15 18 18   Temp:   98.9 F (37.2 C) 98.3 F (36.8 C)  TempSrc:    Oral  SpO2: 95% 97% 98% 90%  Weight:   182 lb 12.8 oz (82.9 kg)   Height:   5\' 4"  (1.626 m)     Intake/Output from previous day: No intake or output data in the 24 hours ending 08/07/16 1217  Physical Exam: Affect appropriate Healthy:  appears stated age HEENT: normal Neck supple with no adenopathy JVP normal no bruits no thyromegaly Lungs clear with no wheezing and good diaphragmatic motion Heart:  S1/S2 no murmur, no rub, gallop or click PMI normal Abdomen: benighn, BS positve, no tenderness, no AAA no bruit.  No HSM or HJR Distal pulses intact with no bruits No edema Neuro non-focal Skin warm and dry No muscular weakness   Lab Results: Basic Metabolic Panel:  Recent Labs  08/06/16 0820  NA 138  K 3.5  CL 97*  CO2 27  GLUCOSE 228*  BUN 15  CREATININE 0.81  CALCIUM 8.2*   CBC:  Recent Labs  08/06/16 0820  WBC 10.1  HGB 11.7*  HCT 36.4  MCV 87.1  PLT 374   Thyroid Function Tests:  Recent Labs  08/06/16 1818  TSH 1.471   Anemia Panel: No results for input(s): VITAMINB12, FOLATE, FERRITIN, TIBC, IRON, RETICCTPCT in the last 72 hours.  Imaging: Dg Chest Port 1 View  Result Date: 08/06/2016 CLINICAL DATA:  Cough. Rapid heart rate. Chest pain and shortness of breath EXAM: PORTABLE CHEST 1 VIEW COMPARISON:  05/10/2016 FINDINGS: Heart is upper limits normal in size. Lungs are clear. No effusions or edema. No acute bony abnormality. IMPRESSION: No active disease. Electronically Signed   By: Rolm Baptise M.D.   On: 08/06/2016 09:26    Cardiac Studies:  ECG: afib IVCD poor R wave progressin   Telemetry:  This am post [facility] NSR 08/07/2016   Echo:  02/13/15 personally reviewed EF 60-65%   Medications:   . amLODipine  5 mg Oral Daily  . apixaban  5 mg Oral BID  . atorvastatin  20 mg Oral Daily  . buPROPion  150 mg Oral BID  . clopidogrel  75 mg Oral Daily  . FLUoxetine  20 mg Oral QHS  . gabapentin  900 mg Oral BID  . insulin lispro  0-9 Units Subcutaneous TID AC & HS  . losartan  50 mg Oral Daily  . methimazole  10 mg Oral Daily  . metoprolol succinate  25 mg Oral BID  . pantoprazole  40 mg Oral Daily  . sotalol  80 mg Oral Q12H      Assessment/Plan:  PAF:  Recurrence due to stopping/running out of sotolol [facility] in ER maintaining NSR Sister has already picked Up new pills. On eliquis d/c home outpatient f/u  Dr Radford Pax  CAD:  -Hx of ASCAD with CTO of the RCA and high grade LCx/OM stenosis s/p DES x 2 in 02/2015 -She is treated with Plavix (no aspirin-she is on Eliquis), Losartan 50 mg and metoprolol succinate 25 mg, and statin -No recent exertional symptoms. istat troponin is negative   Jenkins Rouge 08/07/2016, 12:17 PM

## 2016-08-07 NOTE — Discharge Summary (Signed)
Discharge Summary    Patient ID: Judith [name],  MRN: [id number], DOB/AGE: [birth year] 66 y.o.  Admit date: 08/06/2016 Discharge date: 08/07/2016  Primary Care Provider: [place] Primary Cardiologist: Dr. Radford Pax  Discharge Diagnoses    Principal Problem:   Atrial fibrillation with rapid ventricular response ([place]) Active Problems:   DM2 (diabetes mellitus, type 2) ([place])   HTN (hypertension)   Coronary artery disease involving native coronary artery of native heart without angina pectoris   Hyperthyroidism   Hyperlipidemia LDL goal <70    Diagnostic Studies/Procedures    N/A _____________     History of Present Illness     Judith [name] is a 66 y.o. female with history of HTN, CAD with CTO of the RCA and high grade LCx/OM stenosis s/p DES x 2, hyperlipidemia, Type II DM, Hyperthyroidism on Methimazole, Persistent atrial fibrillationon apixaban and sotalol who presented to [facility] with complaints of palpitations.   She is status post electrical cardioversion 3 in the past, all in the ED in response to rapid afib. She was hospitalized in 11/2015 for atrial fibrillation with RVR and required DCCV in the ED. Since she had had repeated episodes she was then loaded with sotalol. She was last seen in the office by Dr. Radford Pax on 05/20/2016 at which time she was without any adverse symptoms and was noted to have only one episode of breakthrough atrial fibrillation since being on sotalol and that was attributed to a missed dose. Her rhythm went back to normal 30 minutes after taking her sotalol dose.   She awoke the morning of admission with chest tightness and racing heart beat, reminiscent of her prior atrial fibrillation. In looking back she had missed a few doses of her sotalol. She also reported feeling dehydrated related to a recent GI illness with vomiting and diarrhea ending the day prior to admission. She arrived with atrial fibrillation with rapid ventricular response rate and  intermittent WCT (aberrancy) and instability. She did not respond to diltiazem. She required cardioversion with 2 shocks. IV Amiodarone was initiated. She felt much better in NSR. She was admitted for further management.  Hospital Course    Amiodarone was stopped and she was started back on home sotalol last night. She has since maintained NSR. She's not had any other cardiac symptoms otherwise, nor has she had any exertional-type angina. She feels good this morning and wants to go home. Dr. Johnsie Cancel has seen and examined the patient today and feels she is stable for discharge. I have sent a message to the afib clinic requesting a follow-up appointment, and our office will call the patient with this information.  Of note, the patient's K was 3.5 on arrival. It was 3.5 in January, previously 4.4-4.9. She was given 67meq of KCl and asked to increase dietary potassium sources. This borderline level may be due to recent GI losses but would consider f/u level as OP.  _____________  Discharge Vitals Blood pressure 140/64, pulse 70, temperature 98.4 F (36.9 C), temperature source Oral, resp. rate 18, height 5\' 4"  (1.626 m), weight 182 lb 12.8 oz (82.9 kg), SpO2 (!) 70 %.  Filed Weights   08/06/16 4665 08/06/16 2013  Weight: 175 lb (79.4 kg) 182 lb 12.8 oz (82.9 kg)    Labs & Radiologic Studies    CBC  Recent Labs  08/06/16 0820  WBC 10.1  HGB 11.7*  HCT 36.4  MCV 87.1  PLT 993   Basic Metabolic Panel  Recent Labs  08/06/16 0820  NA 138  K 3.5  CL 97*  CO2 27  GLUCOSE 228*  BUN 15  CREATININE 0.81  CALCIUM 8.2*   Thyroid Function Tests  Recent Labs  08/06/16 1818  TSH 1.471   _____________  Dg Chest Port 1 View  Result Date: 08/06/2016 CLINICAL DATA:  Cough. Rapid heart rate. Chest pain and shortness of breath EXAM: PORTABLE CHEST 1 VIEW COMPARISON:  05/10/2016 FINDINGS: Heart is upper limits normal in size. Lungs are clear. No effusions or edema. No acute bony  abnormality. IMPRESSION: No active disease. Electronically Signed   By: Rolm Baptise M.D.   On: 08/06/2016 09:26   Disposition   Pt is being discharged home today in good condition.  Follow-up Plans & Appointments    Follow-up Information     [facility] Follow up.   Specialty:  Cardiology Why:  We have sent in your information to the atrial fib clinic at [facility]. This is a specialized clinic that follows afib patients closely after discharge. Please call the office if you have not heard from [place] within 3 days. Contact information: [address] [id number] [place] [phone]         Discharge Instructions    Diet - low sodium heart healthy    Complete by:  As directed    Increase activity slowly    Complete by:  As directed    Please increase dietary intake of healthy sources of dietary intake of potassium including bananas, squash, yogurt, white beans, sweet potatoes, leafy greens, and avocados.      Discharge Medications   Allergies as of 08/07/2016      Reactions   Dilaudid [hydromorphone Hcl] Other (See Comments)   HEADACHE   Novolog [insulin Aspart] Shortness Of Breath   "breathing problems"   Codeine Nausea And Vomiting   HIGH DOSES-SEVERE VOMITING   Iodine Other (See Comments)   MUST HAVE BENADRYL PRIOR TO PROCEDURE AND RIGHT BEFORE TREATMENT TO COUNTERACT REACTION-BLISTERING REACTION DERMATOLOGICAL   Penicillins Itching, Rash, Other (See Comments)   Has patient had a PCN reaction causing immediate rash, facial/tongue/throat swelling, SOB or lightheadedness with hypotension: Yes Has patient had a PCN reaction causing severe rash involving mucus membranes or skin necrosis: No Has patient had a PCN reaction that required hospitalization No Has patient had a PCN reaction occurring within the last 10 years: No If all of the above answers are "NO", then may proceed with Cephalosporin  use. CHEST SIZED RASH AND ITCHING   Propofol Other (See Comments)   "Breathing problems - asthma attack"   Ace Inhibitors Cough   Demerol [meperidine] Nausea And Vomiting   Neosporin [neomycin-bacitracin Zn-polymyx] Itching, Rash   MAKES REACTIONS WORSE WHEN USING AS PROPHYLACTIC   Percocet [oxycodone-acetaminophen] Rash   Tape Itching, Rash      Medication List    TAKE these medications   albuterol 108 (90 Base) MCG/ACT inhaler Commonly known as:  PROVENTIL HFA Inhale 1-2 puffs into the lungs every 6 (six) hours as needed for wheezing or shortness of breath.   amLODipine 5 MG tablet Commonly known as:  NORVASC Take 1 tablet (5 mg total) by mouth daily.   apixaban 5 MG Tabs tablet Commonly known as:  ELIQUIS Take 1 tablet (5 mg total) by mouth 2 (two) times daily.   atorvastatin 20 MG tablet Commonly known as:  LIPITOR Take 1 tablet (20 mg total) by mouth daily.   buPROPion 150 MG 12 hr  tablet Commonly known as:  WELLBUTRIN SR Take 1 tablet (150 mg total) by mouth 2 (two) times daily.   clindamycin 300 MG capsule Commonly known as:  CLEOCIN Take 300 mg by mouth See admin instructions. Take 1 capsule (300 mg) one hour prior to dental appointment - last taken 02/05/16   clopidogrel 75 MG tablet Commonly known as:  PLAVIX TAKE ONE TABLET BY MOUTH ONCE DAILY   cyclobenzaprine 5 MG tablet Commonly known as:  FLEXERIL TAKE 1 TABLET BY MOUTH THREE TIMES DAILY AS NEEDED FOR MUSCLE SPASM   FLUoxetine 20 MG capsule Commonly known as:  PROZAC Take 1 capsule (20 mg total) by mouth at bedtime.   fluticasone 44 MCG/ACT inhaler Commonly known as:  FLOVENT HFA Inhale 2 puffs into the lungs 2 (two) times daily as needed (shortness of breath/ wheezing).   gabapentin 300 MG capsule Commonly known as:  NEURONTIN TAKE 3 CAPSULES BY MOUTH AT morning AND 3 CAPSULES AT BEDTIME    insulin degludec 100 UNIT/ML Sopn FlexTouch Pen Commonly known as:  TRESIBA FLEXTOUCH Inject 0.2 mLs  (20 Units total) into the skin at bedtime.   insulin lispro 100 UNIT/ML injection Commonly known as:  HUMALOG 3 times a day (just before each meal) 15-25-10 units    losartan 50 MG tablet Commonly known as:  COZAAR TAKE ONE TABLET BY MOUTH ONCE DAILY   metFORMIN 500 MG 24 hr tablet Commonly known as:  GLUCOPHAGE-XR Take 4 tablets (2,000 mg total) by mouth daily with breakfast.   methimazole 10 MG tablet Commonly known as:  TAPAZOLE Take 1 tablet (10 mg total) by mouth daily.   metoprolol succinate 25 MG 24 hr tablet Commonly known as:  TOPROL-XL Take 1 tablet (25 mg total) by mouth 2 (two) times daily. Take with or immediately following a meal.   nitroGLYCERIN 0.4 MG SL tablet Commonly known as:  NITROSTAT Place 1 tablet (0.4 mg total) under the tongue every 5 (five) minutes as needed for chest pain.   nitroGLYCERIN 0.2 mg/hr patch Commonly known as:  NITRODUR - Dosed in mg/24 hr Place 1 patch (0.2 mg total) onto the skin daily. Patch burns patient so she cuts it in half   ondansetron 4 MG tablet Commonly known as:  ZOFRAN Take 1 tablet (4 mg total) by mouth every 8 (eight) hours as needed for nausea or vomiting.   pantoprazole 40 MG tablet Commonly known as:  PROTONIX Take 1 tablet (40 mg total) by mouth daily.   silver sulfADIAZINE 1 % cream Commonly known as:  SILVADENE Apply 1 application topically daily.   sotalol 80 MG tablet Commonly known as:  BETAPACE Take 1 tablet (80 mg total) by mouth every 12 (twelve) hours.   traMADol 50 MG tablet Commonly known as:  ULTRAM Take 1 tablet (50 mg total) by mouth every 6 (six) hours as needed. Maximum dose= 8 tablets per day   triamcinolone 0.1 % paste Commonly known as:  KENALOG Use as directed 1 application in the mouth or throat daily as needed.          Allergies:  Allergies  Allergen Reactions  . Dilaudid [Hydromorphone Hcl] Other (See Comments)    HEADACHE  . Novolog [Insulin Aspart] Shortness Of  Breath    "breathing problems"  . Codeine Nausea And Vomiting    HIGH DOSES-SEVERE VOMITING  . Iodine Other (See Comments)    MUST HAVE BENADRYL PRIOR TO PROCEDURE AND RIGHT BEFORE TREATMENT TO COUNTERACT REACTION-BLISTERING REACTION DERMATOLOGICAL  .  Penicillins Itching, Rash and Other (See Comments)    Has patient had a PCN reaction causing immediate rash, facial/tongue/throat swelling, SOB or lightheadedness with hypotension: Yes Has patient had a PCN reaction causing severe rash involving mucus membranes or skin necrosis: No Has patient had a PCN reaction that required hospitalization No Has patient had a PCN reaction occurring within the last 10 years: No If all of the above answers are "NO", then may proceed with Cephalosporin use.  CHEST SIZED RASH AND ITCHING   . Propofol Other (See Comments)    "Breathing problems - asthma attack"  . Ace Inhibitors Cough  . Demerol [Meperidine] Nausea And Vomiting  . Neosporin [Neomycin-Bacitracin Zn-Polymyx] Itching and Rash    MAKES REACTIONS WORSE WHEN USING AS PROPHYLACTIC  . Percocet [Oxycodone-Acetaminophen] Rash  . Tape Itching and Rash    Outstanding Labs/Studies   N/A, but consider OP BMET  Duration of Discharge Encounter   Greater than 30 minutes including physician time.  Signed, Charlie Pitter PA-C 08/07/2016, 2:28 PM

## 2016-08-10 ENCOUNTER — Telehealth: Payer: Self-pay

## 2016-08-10 NOTE — Telephone Encounter (Signed)
Pt was called and LVM twice to schedule sleep study.  When pt finally was reached, stated that she will schedule when she is ready to.

## 2016-08-13 ENCOUNTER — Other Ambulatory Visit: Payer: Self-pay

## 2016-08-13 ENCOUNTER — Ambulatory Visit (HOSPITAL_COMMUNITY)
Admission: RE | Admit: 2016-08-13 | Discharge: 2016-08-13 | Disposition: A | Discharge status: Home / Self Care | Payer: 59 | Source: Ambulatory Visit | Attending: Nurse Practitioner | Admitting: Nurse Practitioner

## 2016-08-13 ENCOUNTER — Telehealth: Payer: Self-pay | Admitting: Endocrinology

## 2016-08-13 ENCOUNTER — Encounter (HOSPITAL_COMMUNITY): Payer: Self-pay | Admitting: Nurse Practitioner

## 2016-08-13 VITALS — BP 110/78 | HR 68 | Ht 64.0 in | Wt 181.4 lb

## 2016-08-13 DIAGNOSIS — I4891 Unspecified atrial fibrillation: Secondary | ICD-10-CM | POA: Diagnosis present

## 2016-08-13 DIAGNOSIS — I481 Persistent atrial fibrillation: Secondary | ICD-10-CM

## 2016-08-13 DIAGNOSIS — I2510 Atherosclerotic heart disease of native coronary artery without angina pectoris: Secondary | ICD-10-CM | POA: Diagnosis not present

## 2016-08-13 DIAGNOSIS — I129 Hypertensive chronic kidney disease with stage 1 through stage 4 chronic kidney disease, or unspecified chronic kidney disease: Secondary | ICD-10-CM | POA: Insufficient documentation

## 2016-08-13 DIAGNOSIS — I4819 Other persistent atrial fibrillation: Secondary | ICD-10-CM

## 2016-08-13 DIAGNOSIS — Z79899 Other long term (current) drug therapy: Secondary | ICD-10-CM | POA: Diagnosis not present

## 2016-08-13 DIAGNOSIS — E1122 Type 2 diabetes mellitus with diabetic chronic kidney disease: Secondary | ICD-10-CM | POA: Insufficient documentation

## 2016-08-13 DIAGNOSIS — Z880 Allergy status to penicillin: Secondary | ICD-10-CM | POA: Insufficient documentation

## 2016-08-13 DIAGNOSIS — Z87891 Personal history of nicotine dependence: Secondary | ICD-10-CM | POA: Insufficient documentation

## 2016-08-13 DIAGNOSIS — R9431 Abnormal electrocardiogram [ECG] [EKG]: Secondary | ICD-10-CM | POA: Diagnosis not present

## 2016-08-13 DIAGNOSIS — N189 Chronic kidney disease, unspecified: Secondary | ICD-10-CM | POA: Insufficient documentation

## 2016-08-13 DIAGNOSIS — E785 Hyperlipidemia, unspecified: Secondary | ICD-10-CM | POA: Diagnosis not present

## 2016-08-13 DIAGNOSIS — Z885 Allergy status to narcotic agent status: Secondary | ICD-10-CM | POA: Insufficient documentation

## 2016-08-13 DIAGNOSIS — Z794 Long term (current) use of insulin: Secondary | ICD-10-CM | POA: Diagnosis not present

## 2016-08-13 DIAGNOSIS — Z7901 Long term (current) use of anticoagulants: Secondary | ICD-10-CM | POA: Insufficient documentation

## 2016-08-13 DIAGNOSIS — Z7902 Long term (current) use of antithrombotics/antiplatelets: Secondary | ICD-10-CM | POA: Insufficient documentation

## 2016-08-13 DIAGNOSIS — Z8249 Family history of ischemic heart disease and other diseases of the circulatory system: Secondary | ICD-10-CM | POA: Insufficient documentation

## 2016-08-13 DIAGNOSIS — E0590 Thyrotoxicosis, unspecified without thyrotoxic crisis or storm: Secondary | ICD-10-CM | POA: Diagnosis not present

## 2016-08-13 MED ORDER — INSULIN PEN NEEDLE 32G X 4 MM MISC: 5 refills | Status: DC

## 2016-08-13 MED ORDER — "INSULIN SYRINGE 31G X 5/16"" 0.5 ML MISC": 5 refills | Status: DC

## 2016-08-13 NOTE — Progress Notes (Signed)
Primary Care Physician: Reginia Forts, MD Referring Physician: Dr. Chase [name] is a 66 y.o. female with a h/o HTN, CAD with CTO of the RCA and high grade LCx/OM stenosis s/p DES x 2, hyperlipidemia, Type II DM, hyperthyroidism on Methimazole, persistent atrial fibrillationon apixaban and sotalol who presented to [facility] with complaints of palpitations. She was found to be in afib with rvr. She was cardioverted and IV amiodarone was started. It was then discovered that the patient had inadvertently not taken 3 days of sotalol. It was working very well for pt when she was taking on a regular basis. IV amiodarone was stopped and pt restarted on sotalol. She also had a stomach virus with n/v the day before admission, K+ was 3.5 on admission and repleted..  Since d/c, she has been staying in [name]. She feels well.  Today, she denies symptoms of palpitations, chest pain, shortness of breath, orthopnea, PND, lower extremity edema, dizziness, presyncope, syncope, or neurologic sequela. The patient is tolerating medications without difficulties and is otherwise without complaint today.   Past Medical History:  Diagnosis Date  . Abnormal EKG 07/31/2013  . Arthritis    "hands" (03/06/2015)  . Asthma   . Carpal tunnel syndrome, bilateral   . Chronic kidney disease   . Complication of anesthesia    slow to wake up  . Coronary artery disease     2 v CAD with CTO of the RCA and high grade bifurcational LCx/OM stenosis. S/P PCI DES x 2 to the LCx/OM.  Marland Kitchen Diabetic peripheral neuropathy ([place]) "since 1996"  . GERD (gastroesophageal reflux disease)   . Goiter   . Headache    migraines prior to menopause  . History of shingles 06/01/2013  . Hyperlipidemia LDL goal <70 10/13/2015  . Hypertension   . Hyperthyroidism   . PAF (paroxysmal atrial fibrillation) ([place]) 04/29/2015   CHADS2VASC score of 4 now on Apixaban  . Pneumonia ~ 1976  . Tremors of nervous system   . Type II diabetes mellitus (HCC)    insulin dependent   Past Surgical History:  Procedure Laterality Date  . ABDOMINAL HYSTERECTOMY  1988   age 55; CERVICAL DYSPLASIA; ovaries intact.   . AMPUTATION Right 01/23/2016   Procedure: Right 3rd Ray Amputation;  Surgeon: [name], MD;  Location: [facility];  Service: Orthopedics;  Laterality: Right;  . AMPUTATION Right 02/13/2016   Procedure: Right Transmetatarsal Amputation;  Surgeon: [name], MD;  Location: [facility];  Service: Orthopedics;  Laterality: Right;  . CARDIAC CATHETERIZATION N/A 02/27/2015   Procedure: Left Heart Cath and Coronary Angiography;  Surgeon: [name], MD; LAD 40%, mCFX 80%, OM 70%, RCA 100% calcified       . CARDIAC CATHETERIZATION N/A 03/06/2015   Procedure: Coronary Stent Intervention;  Surgeon: [name], MD;  Location: [facility];  Service: Cardiovascular;  Laterality: N/A;  Mid CX 3.50x12 promus DES w/ 0% resdual and Prox OM1 2.50x20 promus DES w/ 20% residual  . CARPAL TUNNEL RELEASE Right Nov 2015  . CARPAL TUNNEL RELEASE Right 1992; 05/2014   [name]; [place]  . CESAREAN SECTION  1982; 1984  . FOOT NEUROMA SURGERY Bilateral 2000  . KNEE ARTHROSCOPY Right ~ 2003   "meniscus repair"  . SHOULDER OPEN ROTATOR CUFF REPAIR Right 1996; 1998   "w/fracture repair"  . THYROID SURGERY  2000   "removed lots of nodules"  . TONSILLECTOMY  1976    Current Outpatient Prescriptions  Medication Sig  Dispense Refill  . albuterol (PROVENTIL HFA) 108 (90 Base) MCG/ACT inhaler Inhale 1-2 puffs into the lungs every 6 (six) hours as needed for wheezing or shortness of breath. 1 Inhaler 0  . amLODipine (NORVASC) 5 MG tablet Take 1 tablet (5 mg total) by mouth daily. 30 tablet 11  . apixaban (ELIQUIS) 5 MG TABS tablet Take 1 tablet (5 mg total) by mouth 2 (two) times daily. 60 tablet 11  . atorvastatin (LIPITOR) 20 MG tablet Take 1 tablet (20 mg total) by mouth daily. 90 tablet 1  . buPROPion (WELLBUTRIN SR) 150 MG 12 hr tablet Take 1 tablet (150  mg total) by mouth 2 (two) times daily. 180 tablet 3  . clindamycin (CLEOCIN) 300 MG capsule Take 300 mg by mouth See admin instructions. Take 1 capsule (300 mg) one hour prior to dental appointment - last taken 02/05/16    . clopidogrel (PLAVIX) 75 MG tablet TAKE ONE TABLET BY MOUTH ONCE DAILY (Patient taking differently: TAKE 75 MG BY MOUTH ONCE DAILY) 90 tablet 3  . cyclobenzaprine (FLEXERIL) 5 MG tablet TAKE 1 TABLET BY MOUTH THREE TIMES DAILY AS NEEDED FOR MUSCLE SPASM 30 tablet 0  . FLUoxetine (PROZAC) 20 MG capsule Take 1 capsule (20 mg total) by mouth at bedtime. 90 capsule 3  . fluticasone (FLOVENT HFA) 44 MCG/ACT inhaler Inhale 2 puffs into the lungs 2 (two) times daily as needed (shortness of breath/ wheezing).     . gabapentin (NEURONTIN) 300 MG capsule TAKE 3 CAPSULES BY MOUTH AT morning AND 3 CAPSULES AT BEDTIME (Patient taking differently: Take 900 mg by mouth 2 (two) times daily. TAKE 3 CAPSULES BY MOUTH EVERY  MORNING AND 3 CAPSULES AT BEDTIME) 540 capsule 1  . insulin degludec (TRESIBA FLEXTOUCH) 100 UNIT/ML SOPN FlexTouch Pen Inject 0.2 mLs (20 Units total) into the skin at bedtime. 5 pen 11  . insulin lispro (HUMALOG) 100 UNIT/ML injection 3 times a day (just before each meal) 15-25-10 units (Patient taking differently: Inject into the skin 3 (three) times daily with meals. 3 times a day (just before each meal) 15 units EVERY MORNING -25 units EVERY AFTERNOON-10 units EVERY EVENING) 30 mL 11  . losartan (COZAAR) 50 MG tablet TAKE ONE TABLET BY MOUTH ONCE DAILY (Patient taking differently: TAKE 50 MG BY MOUTH ONCE DAILY) 90 tablet 3  . metFORMIN (GLUCOPHAGE-XR) 500 MG 24 hr tablet Take 4 tablets (2,000 mg total) by mouth daily with breakfast. (Patient taking differently: Take 1,000 mg by mouth 2 (two) times daily. ) 360 tablet 3  . methimazole (TAPAZOLE) 10 MG tablet Take 1 tablet (10 mg total) by mouth daily. 30 tablet 11  . metoprolol succinate (TOPROL-XL) 25 MG 24 hr tablet Take 1  tablet (25 mg total) by mouth 2 (two) times daily. Take with or immediately following a meal. 60 tablet 11  . nitroGLYCERIN (NITROSTAT) 0.4 MG SL tablet Place 1 tablet (0.4 mg total) under the tongue every 5 (five) minutes as needed for chest pain. 25 tablet 3  . ondansetron (ZOFRAN) 4 MG tablet Take 1 tablet (4 mg total) by mouth every 8 (eight) hours as needed for nausea or vomiting. 20 tablet 0  . pantoprazole (PROTONIX) 40 MG tablet Take 1 tablet (40 mg total) by mouth daily. 90 tablet 3  . silver sulfADIAZINE (SILVADENE) 1 % cream Apply 1 application topically daily. 400 g 0  . sotalol (BETAPACE) 80 MG tablet Take 1 tablet (80 mg total) by mouth every 12 (twelve) hours.  60 tablet 11  . traMADol (ULTRAM) 50 MG tablet Take 1 tablet (50 mg total) by mouth every 6 (six) hours as needed. Maximum dose= 8 tablets per day (Patient taking differently: Take 100 mg by mouth at bedtime. Maximum dose= 8 tablets per day) 60 tablet 0  . triamcinolone (KENALOG) 0.1 % paste Use as directed 1 application in the mouth or throat daily as needed.    . Insulin Pen Needle 32G X 4 MM MISC Use to inject insulin daily 100 each 5  . Insulin Syringe-Needle U-100 (INSULIN SYRINGE .5CC/31GX5/16") 31G X 5/16" 0.5 ML MISC Use to inject insulin 100 each 5   No current facility-administered medications for this encounter.     Allergies  Allergen Reactions  . Dilaudid [Hydromorphone Hcl] Other (See Comments)    HEADACHE  . Novolog [Insulin Aspart] Shortness Of Breath    "breathing problems"  . Codeine Nausea And Vomiting    HIGH DOSES-SEVERE VOMITING  . Iodine Other (See Comments)    MUST HAVE BENADRYL PRIOR TO PROCEDURE AND RIGHT BEFORE TREATMENT TO COUNTERACT REACTION-BLISTERING REACTION DERMATOLOGICAL  . Penicillins Itching, Rash and Other (See Comments)    Has patient had a PCN reaction causing immediate rash, facial/tongue/throat swelling, SOB or lightheadedness with hypotension: Yes Has patient had a PCN reaction  causing severe rash involving mucus membranes or skin necrosis: No Has patient had a PCN reaction that required hospitalization No Has patient had a PCN reaction occurring within the last 10 years: No If all of the above answers are "NO", then may proceed with Cephalosporin use.  CHEST SIZED RASH AND ITCHING   . Propofol Other (See Comments)    "Breathing problems - asthma attack"  . Ace Inhibitors Cough  . Demerol [Meperidine] Nausea And Vomiting  . Neosporin [Neomycin-Bacitracin Zn-Polymyx] Itching and Rash    MAKES REACTIONS WORSE WHEN USING AS PROPHYLACTIC  . Percocet [Oxycodone-Acetaminophen] Rash  . Tape Itching and Rash    Social History   Social History  . Marital status: Divorced    Spouse name: N/A  . Number of children: 2  . Years of education: Masters   Occupational History  . Food Therapist, nutritional    Social History Main Topics  . Smoking status: Former Smoker    Packs/day: 0.00    Years: 41.00    Types: Cigarettes    Quit date: 03/05/2015  . Smokeless tobacco: Never Used     Comment: 04/29/2015 "quit smoking cigarettes 02/27/2015"  . Alcohol use No  . Drug use: No  . Sexual activity: Not Currently    Birth control/ protection: Post-menopausal, Surgical   Other Topics Concern  . 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      Lives with sister in [place].      Tobacco: 1 ppd x 41 years - quit 2016      Alcohol: none      Drugs: none      Exercise:  Walking in neighborhood; physical job.   Right-handed.   2 cups caffeine daily.       Family History  Problem Relation Age of Onset  . Cancer Mother 67    bronchial cancer  . Breast cancer Mother   . Lung cancer Mother   .  Hypertension Father   . COPD Father   . Heart  disease Father 46    CAD with cardiac stenting  . Heart attack Father   . Parkinson's disease Father   . Allergies Sister   . Breast cancer Maternal Grandmother   . Emphysema Maternal Grandfather   . Leukemia Paternal Grandmother   . Emphysema Paternal Grandfather   . Thyroid disease Neg Hx     ROS- All systems are reviewed and negative except as per the HPI above  Physical Exam: Vitals:   08/13/16 0926  BP: 110/78  Pulse: 68  Weight: 181 lb 6.4 oz (82.3 kg)  Height: 5\' 4"  (1.626 m)   Wt Readings from Last 3 Encounters:  08/13/16 181 lb 6.4 oz (82.3 kg)  08/06/16 182 lb 12.8 oz (82.9 kg)  07/27/16 182 lb (82.6 kg)    Labs: Lab Results  Component Value Date   NA 138 08/06/2016   K 3.5 08/06/2016   CL 97 (L) 08/06/2016   CO2 27 08/06/2016   GLUCOSE 228 (H) 08/06/2016   BUN 15 08/06/2016   CREATININE 0.81 08/06/2016   CALCIUM 8.2 (L) 08/06/2016   MG 1.7 11/18/2015   Lab Results  Component Value Date   INR 1.00 02/13/2016   Lab Results  Component Value Date   CHOL 187 02/05/2016   HDL 32 (L) 02/05/2016   LDLCALC 79 02/05/2016   TRIG 380 (H) 02/05/2016     GEN- The patient is well appearing, alert and oriented x 3 today.   Head- normocephalic, atraumatic Eyes-  Sclera clear, conjunctiva pink Ears- hearing intact Oropharynx- clear Neck- supple, no JVP Lymph- no cervical lymphadenopathy Lungs- Clear to ausculation bilaterally, normal work of breathing Heart- Regular rate and rhythm, no murmurs, rubs or gallops, PMI not laterally displaced GI- soft, NT, ND, + BS Extremities- no clubbing, cyanosis, or edema MS- no significant deformity or atrophy Skin- no rash or lesion Psych- euthymic mood, full affect Neuro- strength and sensation are intact  EKG- NSR at 68 bpm, pr int 194 ms, qrs int 82 ms, qtc 489 ms Epic records reviewed    Assessment and Plan: 1. Afib with RVR Successful cardioversion Pt by accident did not take sotalol x 3 days,which   probably caused the RVR, but is staying in SR with restarting med Will recheck K+ Continue Eliquis without any missed doses Continue metoprolol  F/u with Dr. Radford Pax as scheduled in July

## 2016-08-13 NOTE — Telephone Encounter (Signed)
Submitted. Went to [facility], there is [facility] on [address].

## 2016-08-13 NOTE — Telephone Encounter (Signed)
Pt needs her 32G Pen Needles and her 31G Syringes sent to the [facility] on [address] and [address].

## 2016-08-16 ENCOUNTER — Other Ambulatory Visit: Payer: Self-pay | Admitting: Family Medicine

## 2016-08-17 NOTE — Telephone Encounter (Signed)
02/2016 last refills

## 2016-08-18 ENCOUNTER — Other Ambulatory Visit (INDEPENDENT_AMBULATORY_CARE_PROVIDER_SITE_OTHER): Payer: Self-pay | Admitting: Orthopedic Surgery

## 2016-08-18 NOTE — Telephone Encounter (Signed)
Script faxed to pharmacy

## 2016-08-18 NOTE — Telephone Encounter (Signed)
refill 

## 2016-08-19 ENCOUNTER — Telehealth (INDEPENDENT_AMBULATORY_CARE_PROVIDER_SITE_OTHER): Payer: Self-pay | Admitting: *Deleted

## 2016-08-19 NOTE — Telephone Encounter (Signed)
Prescription called into patients pharmacy. I called patient to make her aware this had been addressed.

## 2016-08-21 ENCOUNTER — Ambulatory Visit (INDEPENDENT_AMBULATORY_CARE_PROVIDER_SITE_OTHER): Payer: 59 | Admitting: Family Medicine

## 2016-08-21 ENCOUNTER — Ambulatory Visit (INDEPENDENT_AMBULATORY_CARE_PROVIDER_SITE_OTHER): Payer: 59

## 2016-08-21 VITALS — BP 138/74 | HR 69 | Temp 97.7°F | Resp 17 | Ht 64.5 in | Wt 179.0 lb

## 2016-08-21 DIAGNOSIS — I10 Essential (primary) hypertension: Secondary | ICD-10-CM

## 2016-08-21 DIAGNOSIS — Y92009 Unspecified place in unspecified non-institutional (private) residence as the place of occurrence of the external cause: Secondary | ICD-10-CM | POA: Diagnosis not present

## 2016-08-21 DIAGNOSIS — R0789 Other chest pain: Secondary | ICD-10-CM | POA: Diagnosis not present

## 2016-08-21 DIAGNOSIS — W19XXXA Unspecified fall, initial encounter: Secondary | ICD-10-CM

## 2016-08-21 DIAGNOSIS — S20212A Contusion of left front wall of thorax, initial encounter: Secondary | ICD-10-CM

## 2016-08-21 NOTE — Patient Instructions (Addendum)
     IF you received an x-ray today, you will receive an invoice from [facility]. Please contact [facility] at [phone] with questions or concerns regarding your invoice.   IF you received labwork today, you will receive an invoice from [facility]. Please contact [facility] at [phone] with questions or concerns regarding your invoice.   Our billing staff will not be able to assist you with questions regarding bills from these companies.  You will be contacted with the lab results as soon as they are available. The fastest way to get your results is to activate your My Chart account. Instructions are located on the last page of this paperwork. If you have not heard from us regarding the results in 2 weeks, please contact this office.      Contusion A contusion is a deep bruise. Contusions are the result of a blunt injury to tissues and muscle fibers under the skin. The injury causes bleeding under the skin. The skin overlying the contusion may turn blue, purple, or yellow. Minor injuries will give you a painless contusion, but more severe contusions may stay painful and swollen for a few weeks. What are the causes? This condition is usually caused by a blow, trauma, or direct force to an area of the body. What are the signs or symptoms? Symptoms of this condition include:  Swelling of the injured area.  Pain and tenderness in the injured area.  Discoloration. The area may have redness and then turn blue, purple, or yellow.  How is this diagnosed? This condition is diagnosed based on a physical exam and medical history. An X-ray, CT scan, or MRI may be needed to determine if there are any associated injuries, such as broken bones (fractures). How is this treated? Specific treatment for this condition depends on what area of the body was injured. In general, the best treatment for a contusion is resting, icing, applying pressure to (compression), and elevating the  injured area. This is often called the RICE strategy. Over-the-counter anti-inflammatory medicines may also be recommended for pain control. Follow these instructions at home:  Rest the injured area.  If directed, apply ice to the injured area: ? Put ice in a plastic bag. ? Place a towel between your skin and the bag. ? Leave the ice on for 20 minutes, 2-3 times per day.  If directed, apply light compression to the injured area using an elastic bandage. Make sure the bandage is not wrapped too tightly. Remove and reapply the bandage as directed by your health care provider.  If possible, raise (elevate) the injured area above the level of your heart while you are sitting or lying down.  Take over-the-counter and prescription medicines only as told by your health care provider. Contact a health care provider if:  Your symptoms do not improve after several days of treatment.  Your symptoms get worse.  You have difficulty moving the injured area. Get help right away if:  You have severe pain.  You have numbness in a hand or foot.  Your hand or foot turns pale or cold. This information is not intended to replace advice given to you by your health care provider. Make sure you discuss any questions you have with your health care provider. Document Released: 02/03/2005 Document Revised: 09/04/2015 Document Reviewed: 09/11/2014 Elsevier Interactive Patient Education  2017 [employer]  

## 2016-08-21 NOTE — Progress Notes (Signed)
Chief Complaint  Patient presents with  . Fall    Fell Thursday night-C/O right rib pain    HPI  Fall at home Chest wall pain- left side Pt reports that she has been having some rib pain on the right after a fall 2 nights ago. 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 Abnormal EKG 07/31/2013  . Arthritis    "hands" (03/06/2015)  . Asthma   . Carpal tunnel syndrome, bilateral   . Chronic kidney disease   . Complication of anesthesia    slow to wake up  . Coronary artery disease     2 v CAD with CTO of the RCA and high grade bifurcational LCx/OM stenosis. S/P PCI DES x 2 to the LCx/OM.  Marland Kitchen Diabetic peripheral neuropathy ([place]) "since 1996"  . GERD (gastroesophageal reflux disease)   . Goiter   . Headache    migraines prior to menopause  . History of shingles 06/01/2013  . Hyperlipidemia LDL goal <70 10/13/2015  . Hypertension   . Hyperthyroidism   . PAF (paroxysmal atrial fibrillation) ([place]) 04/29/2015   CHADS2VASC score of 4 now on Apixaban  . Pneumonia ~ 1976  . Tremors of nervous system   . Type II diabetes mellitus (HCC)    insulin dependent    Current Outpatient Prescriptions  Medication Sig Dispense Refill  . albuterol (PROVENTIL HFA) 108 (90 Base) MCG/ACT inhaler Inhale 1-2 puffs into the lungs every 6 (six) hours as needed for wheezing or shortness of breath. 1 Inhaler 0  . amLODipine (NORVASC) 5 MG tablet Take 1 tablet (5 mg total) by mouth daily. 30 tablet 11  . apixaban  (ELIQUIS) 5 MG TABS tablet Take 1 tablet (5 mg total) by mouth 2 (two) times daily. 60 tablet 11  . atorvastatin (LIPITOR) 20 MG tablet Take 1 tablet (20 mg total) by mouth daily. 90 tablet 1  . buPROPion (WELLBUTRIN SR) 150 MG 12 hr tablet Take 1 tablet (150 mg total) by mouth 2 (two) times daily. 180 tablet 3  . clindamycin (CLEOCIN) 300 MG capsule Take 300 mg by mouth See admin instructions. Take 1 capsule (300 mg) one hour prior to dental appointment - last taken 02/05/16    . clopidogrel (PLAVIX) 75 MG tablet TAKE ONE TABLET BY MOUTH ONCE DAILY (Patient taking differently: TAKE 75 MG BY MOUTH ONCE DAILY) 90 tablet 3  . cyclobenzaprine (FLEXERIL) 5 MG tablet TAKE 1 TABLET BY MOUTH THREE TIMES DAILY AS NEEDED FOR MUSCLE SPASM 30 tablet 0  . FLUoxetine (PROZAC) 20 MG capsule Take 1 capsule (20 mg total) by mouth at bedtime. 90 capsule 3  . fluticasone (FLOVENT HFA) 44 MCG/ACT inhaler Inhale 2 puffs into the lungs 2 (two) times daily as needed (shortness of breath/ wheezing).     . gabapentin (NEURONTIN) 300 MG capsule TAKE 3 CAPSULES BY MOUTH AT morning AND 3 CAPSULES AT BEDTIME (Patient taking differently: Take 900 mg by mouth 2 (two) times  daily. TAKE 3 CAPSULES BY MOUTH EVERY  MORNING AND 3 CAPSULES AT BEDTIME) 540 capsule 1  . insulin degludec (TRESIBA FLEXTOUCH) 100 UNIT/ML SOPN FlexTouch Pen Inject 0.2 mLs (20 Units total) into the skin at bedtime. 5 pen 11  . insulin lispro (HUMALOG) 100 UNIT/ML injection 3 times a day (just before each meal) 15-25-10 units (Patient taking differently: Inject into the skin 3 (three) times daily with meals. 3 times a day (just before each meal) 15 units EVERY MORNING -25 units EVERY AFTERNOON-10 units EVERY EVENING) 30 mL 11  . Insulin Pen Needle 32G X 4 MM MISC Use to inject insulin daily 100 each 5  . Insulin Syringe-Needle U-100 (INSULIN SYRINGE .5CC/31GX5/16") 31G X 5/16" 0.5 ML MISC Use to inject insulin 100 each 5  . losartan (COZAAR) 50 MG tablet TAKE ONE  TABLET BY MOUTH ONCE DAILY (Patient taking differently: TAKE 50 MG BY MOUTH ONCE DAILY) 90 tablet 3  . metFORMIN (GLUCOPHAGE-XR) 500 MG 24 hr tablet Take 4 tablets (2,000 mg total) by mouth daily with breakfast. (Patient taking differently: Take 1,000 mg by mouth 2 (two) times daily. ) 360 tablet 3  . methimazole (TAPAZOLE) 10 MG tablet Take 1 tablet (10 mg total) by mouth daily. 30 tablet 11  . metoprolol succinate (TOPROL-XL) 25 MG 24 hr tablet Take 1 tablet (25 mg total) by mouth 2 (two) times daily. Take with or immediately following a meal. 60 tablet 11  . nitroGLYCERIN (NITROSTAT) 0.4 MG SL tablet Place 1 tablet (0.4 mg total) under the tongue every 5 (five) minutes as needed for chest pain. 25 tablet 3  . ondansetron (ZOFRAN) 4 MG tablet Take 1 tablet (4 mg total) by mouth every 8 (eight) hours as needed for nausea or vomiting. 20 tablet 0  . pantoprazole (PROTONIX) 40 MG tablet Take 1 tablet (40 mg total) by mouth daily. 90 tablet 3  . silver sulfADIAZINE (SILVADENE) 1 % cream Apply 1 application topically daily. 400 g 0  . sotalol (BETAPACE) 80 MG tablet Take 1 tablet (80 mg total) by mouth every 12 (twelve) hours. 60 tablet 11  . traMADol (ULTRAM) 50 MG tablet TAKE ONE TABLET BY MOUTH EVERY 6 HOURS AS NEEDED FOR MODERATE PAIN. (MAX DOSE: 8 TABS PER DAY) 60 tablet 0  . triamcinolone (KENALOG) 0.1 % paste Use as directed 1 application in the mouth or throat daily as needed.     No current facility-administered medications for this visit.     Allergies:  Allergies  Allergen Reactions  . Dilaudid [Hydromorphone Hcl] Other (See Comments)    HEADACHE  . Novolog [Insulin Aspart] Shortness Of Breath    "breathing problems"  . Codeine Nausea And Vomiting    HIGH DOSES-SEVERE VOMITING  . Iodine Other (See Comments)    MUST HAVE BENADRYL PRIOR TO PROCEDURE AND RIGHT BEFORE TREATMENT TO COUNTERACT REACTION-BLISTERING REACTION DERMATOLOGICAL  . Penicillins Itching, Rash and Other (See  Comments)    Has patient had a PCN reaction causing immediate rash, facial/tongue/throat swelling, SOB or lightheadedness with hypotension: Yes Has patient had a PCN reaction causing severe rash involving mucus membranes or skin necrosis: No Has patient had a PCN reaction that required hospitalization No Has patient had a PCN reaction occurring within the last 10 years: No If all of the above answers are "NO", then may proceed with Cephalosporin use.  CHEST SIZED RASH AND ITCHING   . Propofol Other (See Comments)    "Breathing problems - asthma attack"  .  Ace Inhibitors Cough  . Demerol [Meperidine] Nausea And Vomiting  . Neosporin [Neomycin-Bacitracin Zn-Polymyx] Itching and Rash    MAKES REACTIONS WORSE WHEN USING AS PROPHYLACTIC  . Percocet [Oxycodone-Acetaminophen] Rash  . Tape Itching and Rash    Past Surgical History:  Procedure Laterality Date  . ABDOMINAL HYSTERECTOMY  1988   age 51; CERVICAL DYSPLASIA; ovaries intact.   . AMPUTATION Right 01/23/2016   Procedure: Right 3rd Ray Amputation;  Surgeon: [name], MD;  Location: [facility];  Service: Orthopedics;  Laterality: Right;  . AMPUTATION Right 02/13/2016   Procedure: Right Transmetatarsal Amputation;  Surgeon: [name], MD;  Location: [facility];  Service: Orthopedics;  Laterality: Right;  . CARDIAC CATHETERIZATION N/A 02/27/2015   Procedure: Left Heart Cath and Coronary Angiography;  Surgeon: [name], MD; LAD 40%, mCFX 80%, OM 70%, RCA 100% calcified       . CARDIAC CATHETERIZATION N/A 03/06/2015   Procedure: Coronary Stent Intervention;  Surgeon: [name], MD;  Location: [facility];  Service: Cardiovascular;  Laterality: N/A;  Mid CX 3.50x12 promus DES w/ 0% resdual and Prox OM1 2.50x20 promus DES w/ 20% residual  . CARPAL TUNNEL RELEASE Right Nov 2015  . CARPAL TUNNEL RELEASE Right 1992; 05/2014   [name]; [place]  . CESAREAN SECTION  1982; 1984  . FOOT NEUROMA SURGERY Bilateral 2000  . KNEE  ARTHROSCOPY Right ~ 2003   "meniscus repair"  . SHOULDER OPEN ROTATOR CUFF REPAIR Right 1996; 1998   "w/fracture repair"  . THYROID SURGERY  2000   "removed lots of nodules"  . TONSILLECTOMY  1976    Social History   Social History  . Marital status: Divorced    Spouse name: N/A  . Number of children: 2  . Years of education: Masters   Occupational History  . Food Therapist, nutritional    Social History Main Topics  . Smoking status: Former Smoker    Packs/day: 0.00    Years: 41.00    Types: Cigarettes    Quit date: 03/05/2015  . Smokeless tobacco: Never Used     Comment: 04/29/2015 "quit smoking cigarettes 02/27/2015"  . Alcohol use No  . Drug use: No  . Sexual activity: Not Currently    Birth control/ protection: Post-menopausal, Surgical   Other Topics Concern  . 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      Lives with sister in [place].      Tobacco: 1 ppd x 41 years - quit 2016      Alcohol: none      Drugs: none      Exercise:  Walking in neighborhood; physical job.   Right-handed.   2 cups caffeine daily.       Review of Systems  Constitutional: Negative for chills and fever.  HENT: Negative for hearing loss and tinnitus.   Eyes: Negative for blurred vision and double vision.  Respiratory: Negative for shortness of breath and wheezing.   Gastrointestinal: Negative for abdominal pain, nausea and vomiting.  Genitourinary: Negative for dysuria and flank pain.  Musculoskeletal: Positive for falls. Negative for back pain.  Neurological: Negative for dizziness, tingling, tremors, focal weakness and loss of consciousness.    Objective: Vitals:   08/21/16 1151  08/21/16 1244  BP: (!) 159/86 138/74  Pulse: 69   Resp: 17   Temp: 97.7 F  (36.5 C)   TempSrc: Oral   SpO2: 98%   Weight: 179 lb (81.2 kg)   Height: 5' 4.5" (1.638 m)     Physical Exam General: alert, oriented, in NAD Head: normocephalic, atraumatic, no sinus tenderness Eyes: EOM intact, no scleral icterus or conjunctival injection Ears: TM clear bilaterally Nose: mucosa nonerythematous, nonedematous Throat: no pharyngeal exudate or erythema Lymph: no posterior auricular, submental or cervical lymph adenopathy Heart: normal rate, normal sinus rhythm, no murmurs Lungs: clear to auscultation bilaterally, no wheezing Chest wall: tender over palpation of the left lateral wall in the mid-axillary line     Study Result   CLINICAL DATA:  Fall with left chest and rib pain. Initial encounter.  EXAM: LEFT RIBS AND CHEST - 3+ VIEW  COMPARISON:  08/06/2016 and prior exams.  FINDINGS: The cardiomediastinal silhouette is unremarkable.  There is no evidence of focal airspace disease, pulmonary edema, suspicious pulmonary nodule/mass, pleural effusion, or pneumothorax. No acute bony abnormalities are identified.  Remote fractures of the left seventh and eighth ribs are noted.  IMPRESSION: No evidence of acute abnormality.  Remote left rib fractures.   Electronically Signed   By: Margarette Canada M.D.   On: 08/21/2016 13:23    Assessment and Plan Judith [name] was seen today for fall.  Diagnoses and all orders for this visit:  Fall in home, initial encounter -     DG Ribs Unilateral W/Chest Left  Left-sided chest wall pain -     DG Ribs Unilateral W/Chest Left  Contusion of rib on left side, initial encounter  Advised pt to take big breaths to avoid atelectasis Discussed that she likely has a contusion Advised pt use topical pain meds such as aspercreme  Essential Hypertension - rechecked bp and bp is in better range Pt admits to white coat hypertension   Tanyon Alipio A Nolon Rod

## 2016-08-25 ENCOUNTER — Ambulatory Visit (INDEPENDENT_AMBULATORY_CARE_PROVIDER_SITE_OTHER): Payer: 59

## 2016-08-25 ENCOUNTER — Ambulatory Visit (INDEPENDENT_AMBULATORY_CARE_PROVIDER_SITE_OTHER): Payer: 59 | Admitting: Physician Assistant

## 2016-08-25 VITALS — BP 167/78 | HR 72 | Temp 99.3°F | Ht 64.0 in | Wt 181.4 lb

## 2016-08-25 DIAGNOSIS — G8929 Other chronic pain: Secondary | ICD-10-CM

## 2016-08-25 DIAGNOSIS — M546 Pain in thoracic spine: Secondary | ICD-10-CM

## 2016-08-25 NOTE — Progress Notes (Signed)
08/29/2016 9:34 AM   DOB: July 22, 1950 / MRN: [id number]  SUBJECTIVE:  Judith [name] is a 66 y.o. female presenting for re-evaluation of her back pain. She was scheduled to have an MRI per radiologists recs however her insurance company denied this.    Her pain started in February. She has tried acupuncture for the pain.  She has a history of diabetes and HTN making her a poor candidate for PO steroids and NSAIDs.  She has been taking tramadol for pain and this has been helping but the pain has been pushing through that.  She is also taking gabapentin 900 mg BID. She has tried to see her orthopod but they will not see her without an MRI. Tells me her pain is getting worse.  She has not tried tylenol.   She sees Dr. Loanne Drilling for hyperthyroidism.  Recently underwent ablation for atrial fib and this was successful roughly 3 weeks ago. Tells me she ran out of her sotalol and is now taking.   She is allergic to dilaudid [hydromorphone hcl]; novolog [insulin aspart]; codeine; iodine; penicillins; propofol; ace inhibitors; demerol [meperidine]; neosporin [neomycin-bacitracin zn-polymyx]; percocet [oxycodone-acetaminophen]; and tape.   She  has a past medical history of Abnormal EKG (07/31/2013); Arthritis; Asthma; Carpal tunnel syndrome, bilateral; Chronic kidney disease; Complication of anesthesia; Coronary artery disease; Diabetic peripheral neuropathy (HCC) ("since 1996"); GERD (gastroesophageal reflux disease); Goiter; Headache; History of shingles (06/01/2013); Hyperlipidemia LDL goal <70 (10/13/2015); Hypertension; Hyperthyroidism; PAF (paroxysmal atrial fibrillation) ([place]) (04/29/2015); Pneumonia (~ 1976); Tremors of nervous system; and Type II diabetes mellitus ([place]).    She  reports that she quit smoking about 17 months ago. Her smoking use included Cigarettes. She smoked 0.00 packs per day for 41.00 years. She has never used smokeless tobacco. She reports that she does not drink alcohol or use drugs. She   reports that she does not currently engage in sexual activity. She reports using the following methods of birth control/protection: Post-menopausal and Surgical. The patient  has a past surgical history that includes Tonsillectomy (1976); Cesarean section (0093; 1984); Carpal tunnel release (Right, Nov 2015); Knee arthroscopy (Right, ~ 2003); Shoulder open rotator cuff repair (Right, 1996; 1998); Foot neuroma surgery (Bilateral, 2000); Carpal tunnel release (Right, 1992; 05/2014); Thyroid surgery (2000); Cardiac catheterization (N/A, 02/27/2015); Cardiac catheterization (N/A, 03/06/2015); Abdominal hysterectomy (1988); Amputation (Right, 01/23/2016); and Amputation (Right, 02/13/2016).  Her family history includes Allergies in her sister; Breast cancer in her maternal grandmother and mother; COPD in her father; Cancer (age of onset: 32) in her mother; Emphysema in her maternal grandfather and paternal grandfather; Heart attack in her father; Heart disease (age of onset: 78) in her father; Hypertension in her father; Leukemia in her paternal grandmother; Lung cancer in her mother; Parkinson's disease in her father.  Review of Systems  Constitutional: Negative for chills and fever.  Musculoskeletal: Positive for back pain, myalgias and neck pain. Negative for falls and joint pain.  Skin: Negative for itching and rash.  Neurological: Negative for dizziness.    The problem list and medications were reviewed and updated by myself where necessary and exist elsewhere in the encounter.   OBJECTIVE:  BP (!) 167/78 (BP Location: Right Arm, Patient Position: Sitting, Cuff Size: Small)   Pulse 72   Temp 99.3 F (37.4 C) (Oral)   Ht 5\' 4"  (1.626 m)   Wt 181 lb 6.4 oz (82.3 kg)   BMI 31.14 kg/m   Physical Exam  Constitutional: She is oriented to person, place, and time.  She appears well-developed and well-nourished.  Pulmonary/Chest: Effort normal and breath sounds normal.  Abdominal: Soft. Bowel sounds are  normal.  Musculoskeletal:       Back:  Neurological: She is alert and oriented to person, place, and time.    Lab Results  Component Value Date   HGBA1C 5.8 03/16/2016   liver function te  Lab Results  Component Value Date   ALT 15 05/09/2016   AST 20 05/09/2016   ALKPHOS 79 05/09/2016   BILITOT 0.5 05/09/2016      No results found for this or any previous visit (from the past 72 hour(s)).  No results found.  ASSESSMENT AND PLAN:  Jacques was seen today for follow-up.  Diagnoses and all orders for this visit:  Chronic left-sided thoracic back pain: Symptoms out of proportion to radiographic findings.  She does have a 40 pack year history of smoking.  Now failing several therapies. MRI needed to rule out mass.  Poor candidate for NSAIDS and PO pred 2/2 to diabetes. Pain now breaking through gabapentin and tramadol. -     DG Thoracic Spine 2 View; Future -     MR Thoracic Spine Wo Contrast; Future    The patient is advised to call or return to clinic if she does not see an improvement in symptoms, or to seek the care of the closest emergency department if she worsens with the above plan.   Philis Fendt, MHS, PA-C [facility] 08/29/2016 9:34 AM

## 2016-08-25 NOTE — Patient Instructions (Signed)
Take 1000 mg of tylenol every 8 hours as needed in addtion to other pain medication.

## 2016-09-03 NOTE — Telephone Encounter (Signed)
[employer] calling to state that they will approve MRI with the CPT code [id number]. Can we place a new order with this exact code? Thanks!   Josem Kaufmann [id number]

## 2016-09-04 ENCOUNTER — Encounter: Payer: Self-pay | Admitting: Physician Assistant

## 2016-09-04 ENCOUNTER — Ambulatory Visit (INDEPENDENT_AMBULATORY_CARE_PROVIDER_SITE_OTHER): Payer: 59 | Admitting: Physician Assistant

## 2016-09-04 VITALS — BP 134/70 | HR 83 | Resp 14 | Ht 64.0 in | Wt 181.0 lb

## 2016-09-04 DIAGNOSIS — T8130XA Disruption of wound, unspecified, initial encounter: Secondary | ICD-10-CM | POA: Diagnosis not present

## 2016-09-04 MED ORDER — CEPHALEXIN 500 MG PO CAPS: 500.0000 mg | ORAL_CAPSULE | Freq: Three times a day (TID) | ORAL | 0 refills | Status: DC

## 2016-09-04 NOTE — Progress Notes (Signed)
09/06/2016 9:39 AM   DOB: 1951/01/17 / MRN: [id number]  SUBJECTIVE:  Judith [name] is a 66 y.o. female presenting for right sided wound dehiscence.  Tells me she had some toes removed earlier in the year thought to be secondary to uncontrolled diabetes. The wound has been doing very well and has largely healed.    She is allergic to dilaudid [hydromorphone hcl]; novolog [insulin aspart]; codeine; iodine; penicillins; propofol; ace inhibitors; demerol [meperidine]; neosporin [neomycin-bacitracin zn-polymyx]; percocet [oxycodone-acetaminophen]; and tape.   She  has a past medical history of Abnormal EKG (07/31/2013); Arthritis; Asthma; Carpal tunnel syndrome, bilateral; Chronic kidney disease; Complication of anesthesia; Coronary artery disease; Diabetic peripheral neuropathy (HCC) ("since 1996"); GERD (gastroesophageal reflux disease); Goiter; Headache; History of shingles (06/01/2013); Hyperlipidemia LDL goal <70 (10/13/2015); Hypertension; Hyperthyroidism; PAF (paroxysmal atrial fibrillation) ([place]) (04/29/2015); Pneumonia (~ 1976); Tremors of nervous system; and Type II diabetes mellitus ([place]).    She  reports that she quit smoking about 18 months ago. Her smoking use included Cigarettes. She smoked 0.00 packs per day for 41.00 years. She has never used smokeless tobacco. She reports that she does not drink alcohol or use drugs. She  reports that she does not currently engage in sexual activity. She reports using the following methods of birth control/protection: Post-menopausal and Surgical. The patient  has a past surgical history that includes Tonsillectomy (1976); Cesarean section (1275; 1984); Carpal tunnel release (Right, Nov 2015); Knee arthroscopy (Right, ~ 2003); Shoulder open rotator cuff repair (Right, 1996; 1998); Foot neuroma surgery (Bilateral, 2000); Carpal tunnel release (Right, 1992; 05/2014); Thyroid surgery (2000); Cardiac catheterization (N/A, 02/27/2015); Cardiac catheterization (N/A,  03/06/2015); Abdominal hysterectomy (1988); Amputation (Right, 01/23/2016); and Amputation (Right, 02/13/2016).  Her family history includes Allergies in her sister; Breast cancer in her maternal grandmother and mother; COPD in her father; Cancer (age of onset: 56) in her mother; Emphysema in her maternal grandfather and paternal grandfather; Heart attack in her father; Heart disease (age of onset: 43) in her father; Hypertension in her father; Leukemia in her paternal grandmother; Lung cancer in her mother; Parkinson's disease in her father.  Review of Systems  Constitutional: Negative for chills, diaphoresis and fever.  Gastrointestinal: Negative for nausea.  Skin: Negative for rash.  Neurological: Negative for dizziness.    The problem list and medications were reviewed and updated by myself where necessary and exist elsewhere in the encounter.   OBJECTIVE:  BP 134/70   Pulse 83   Resp 14   Ht 5\' 4"  (1.626 m)   Wt 181 lb (82.1 kg)   SpO2 95%   BMI 31.07 kg/m   Physical Exam  Constitutional: She is active.  Non-toxic appearance.  Cardiovascular: Normal rate.   Pulmonary/Chest: Effort normal. No tachypnea.  Neurological: She is alert.  Skin: Skin is warm and dry. She is not diaphoretic. No pallor.  Non tender early wound dehiscence. No signs of infection.          No results found for this or any previous visit (from the past 72 hour(s)).  No results found.  ASSESSMENT AND PLAN:  [name] was seen today for foot problem.  Diagnoses and all orders for this visit:  Wound dehiscence: Early.  She is using silvadene. Given history of diabetes starting keflex.  She has follow in place with her surgeon within the next two weeks.  She will come back here if worsening.  -     cephALEXin (KEFLEX) 500 MG capsule; Take 1 capsule (500 mg total) by  mouth 3 (three) times daily.       The patient is advised to call or return to clinic if she does not see an improvement in symptoms, or  to seek the care of the closest emergency department if she worsens with the above plan.   Philis Fendt, MHS, PA-C [facility] 09/06/2016 9:39 AM

## 2016-09-04 NOTE — Patient Instructions (Addendum)
Call [facility] to schedule your MRI. Their number is [phone]      IF you received an x-ray today, you will receive an invoice from [facility]. Please contact [facility] at [phone] with questions or concerns regarding your invoice.   IF you received labwork today, you will receive an invoice from [facility]. Please contact [facility] at [phone] with questions or concerns regarding your invoice.   Our billing staff will not be able to assist you with questions regarding bills from these companies.  You will be contacted with the lab results as soon as they are available. The fastest way to get your results is to activate your My Chart account. Instructions are located on the last page of this paperwork. If you have not heard from [place] regarding the results in 2 weeks, please contact this office.

## 2016-09-04 NOTE — Addendum Note (Signed)
Addended by: [name] on: 09/04/2016 02:45 PM   Modules accepted: Orders

## 2016-09-07 ENCOUNTER — Ambulatory Visit
Admission: RE | Admit: 2016-09-07 | Discharge: 2016-09-07 | Disposition: A | Discharge status: Home / Self Care | Payer: 59 | Source: Ambulatory Visit | Attending: Physician Assistant | Admitting: Physician Assistant

## 2016-09-07 DIAGNOSIS — G8929 Other chronic pain: Secondary | ICD-10-CM

## 2016-09-07 DIAGNOSIS — M546 Pain in thoracic spine: Principal | ICD-10-CM

## 2016-09-09 ENCOUNTER — Ambulatory Visit (INDEPENDENT_AMBULATORY_CARE_PROVIDER_SITE_OTHER): Payer: 59 | Admitting: Orthopedic Surgery

## 2016-09-09 ENCOUNTER — Encounter (INDEPENDENT_AMBULATORY_CARE_PROVIDER_SITE_OTHER): Payer: Self-pay | Admitting: Orthopedic Surgery

## 2016-09-09 VITALS — Ht 64.0 in | Wt 181.0 lb

## 2016-09-09 DIAGNOSIS — L97511 Non-pressure chronic ulcer of other part of right foot limited to breakdown of skin: Secondary | ICD-10-CM | POA: Diagnosis not present

## 2016-09-09 DIAGNOSIS — Z89431 Acquired absence of right foot: Secondary | ICD-10-CM

## 2016-09-09 NOTE — Progress Notes (Signed)
Office Visit Note   Patient: Judith [name]           Date of Birth: 05/04/1951           MRN: [id number] Visit Date: 09/09/2016              Requested by: [name], MD [address] [place], [place] [address] PCP: Reginia Forts, MD  Chief Complaint  Patient presents with  . Right Foot - Follow-up    02/13/16 right transmet amputation       HPI: Patient is status post right transmetatarsal amputation approximately 6 months ago. Patient has recently been just started on Keflex 3 times a day on 09/04/2016 due to a recent abrasion to her transmetatarsal amputation. Patient was concerned of infection. She denies any fever or chills  Assessment & Plan: Visit Diagnoses:  1. S/P transmetatarsal amputation of foot, right ([place])     Plan: Denies any drainage or redness or swelling. She will complete her course of oral antibiotics. She will follow with [employer] for her shoe and bracing. She will follow up in the office once she obtains that shoe and bracing.  Follow-Up Instructions: Return if symptoms worsen or fail to improve.   Ortho Exam  Patient is alert, oriented, no adenopathy, well-dressed, normal affect, normal respiratory effort. Patient is ambulating and unstable with regular shoewear on her right foot. Examination she has good dorsiflexion and plantar flexion. The incision is well-healed there is no cellulitis no drainage no signs of infection. Her previous ulcer has healed. There is no hypertrophic callus.  Imaging: No results found.  Labs: Lab Results  Component Value Date   HGBA1C 5.8 03/16/2016   HGBA1C 8.8 01/15/2016   HGBA1C 9.2 10/15/2015   REPTSTATUS 02/14/2015 FINAL 02/12/2015   CULT  02/12/2015    >=100,000 COLONIES/mL GROUP B STREP(S.AGALACTIAE)ISOLATED TESTING AGAINST S. AGALACTIAE NOT ROUTINELY PERFORMED DUE TO PREDICTABILITY OF AMP/PEN/VAN SUSCEPTIBILITY.    LABORGA Multiple bacterial morphotypes present, none 12/06/2014   LABORGA predominant. Suggest  appropriate recollection if  12/06/2014   LABORGA clinically indicated. 12/06/2014    Orders:  No orders of the defined types were placed in this encounter.  No orders of the defined types were placed in this encounter.    Procedures: No procedures performed  Clinical Data: No additional findings.  ROS:  All other systems negative, except as noted in the HPI. Review of Systems  Objective: Vital Signs: Ht 5\' 4"  (1.626 m)   Wt 181 lb (82.1 kg)   BMI 31.07 kg/m   Specialty Comments:  No specialty comments available.  PMFS History: Patient Active Problem List   Diagnosis Date Noted  . Atrial fibrillation with rapid ventricular response ([place]) 08/06/2016  . Diabetic ulcer of right foot associated with type 2 diabetes mellitus, limited to breakdown of skin ([place]) 06/18/2016  . SBO (small bowel obstruction) ([place]) 05/10/2016  . S/P transmetatarsal amputation of foot, right ([place]) 04/19/2016  . Obstructive sleep apnea 11/26/2015  . Bilateral carpal tunnel syndrome 11/26/2015  . Hypomagnesemia 11/16/2015  . Hyperlipidemia LDL goal <70 10/13/2015  . Abnormality of gait 09/02/2015  . Memory loss 08/12/2015  . Diabetic peripheral neuropathy ([place]) 08/12/2015  . Vitamin D deficiency 08/12/2015  . Hyperthyroidism 04/30/2015  . Heme positive stool   . Persistent atrial fibrillation ([place]) 04/29/2015  . Coronary artery disease involving native coronary artery of native heart without angina pectoris 03/29/2015  . Exertional angina ([place]) 03/06/2015  . Abnormal nuclear stress test   . Acute  kidney injury ([place]) 02/12/2015  . History of goiter 09/28/2014  . GERD (gastroesophageal reflux disease) 07/31/2013  . Depression with anxiety 06/01/2013  . DM2 (diabetes mellitus, type 2) ([place]) 01/30/2013  . HTN (hypertension) 01/30/2013  . Diabetic neuropathy, painful ([place]) 01/30/2013  . Tobacco user 01/30/2013   Past Medical History:  Diagnosis Date  . Abnormal EKG 07/31/2013  . Arthritis      "hands" (03/06/2015)  . Asthma   . Carpal tunnel syndrome, bilateral   . Chronic kidney disease   . Complication of anesthesia    slow to wake up  . Coronary artery disease     2 v CAD with CTO of the RCA and high grade bifurcational LCx/OM stenosis. S/P PCI DES x 2 to the LCx/OM.  Marland Kitchen Diabetic peripheral neuropathy ([place]) "since 1996"  . GERD (gastroesophageal reflux disease)   . Goiter   . Headache    migraines prior to menopause  . History of shingles 06/01/2013  . Hyperlipidemia LDL goal <70 10/13/2015  . Hypertension   . Hyperthyroidism   . PAF (paroxysmal atrial fibrillation) ([place]) 04/29/2015   CHADS2VASC score of 4 now on Apixaban  . Pneumonia ~ 1976  . Tremors of nervous system   . Type II diabetes mellitus (HCC)    insulin dependent    Family History  Problem Relation Age of Onset  . Cancer Mother 20    bronchial cancer  . Breast cancer Mother   . Lung cancer Mother   . Hypertension Father   . COPD Father   . Heart disease Father 3    CAD with cardiac stenting  . Heart attack Father   . Parkinson's disease Father   . Allergies Sister   . Breast cancer Maternal Grandmother   . Emphysema Maternal Grandfather   . Leukemia Paternal Grandmother   . Emphysema Paternal Grandfather   . Thyroid disease Neg Hx     Past Surgical History:  Procedure Laterality Date  . ABDOMINAL HYSTERECTOMY  1988   age 53; CERVICAL DYSPLASIA; ovaries intact.   . AMPUTATION Right 01/23/2016   Procedure: Right 3rd Ray Amputation;  Surgeon: [name], MD;  Location: [facility];  Service: Orthopedics;  Laterality: Right;  . AMPUTATION Right 02/13/2016   Procedure: Right Transmetatarsal Amputation;  Surgeon: [name], MD;  Location: [facility];  Service: Orthopedics;  Laterality: Right;  . CARDIAC CATHETERIZATION N/A 02/27/2015   Procedure: Left Heart Cath and Coronary Angiography;  Surgeon: [name], MD; LAD 40%, mCFX 80%, OM 70%, RCA 100% calcified       . CARDIAC CATHETERIZATION N/A  03/06/2015   Procedure: Coronary Stent Intervention;  Surgeon: [name], MD;  Location: [facility];  Service: Cardiovascular;  Laterality: N/A;  Mid CX 3.50x12 promus DES w/ 0% resdual and Prox OM1 2.50x20 promus DES w/ 20% residual  . CARPAL TUNNEL RELEASE Right Nov 2015  . CARPAL TUNNEL RELEASE Right 1992; 05/2014   [name]; [place]  . CESAREAN SECTION  1982; 1984  . FOOT NEUROMA SURGERY Bilateral 2000  . KNEE ARTHROSCOPY Right ~ 2003   "meniscus repair"  . SHOULDER OPEN ROTATOR CUFF REPAIR Right 1996; 1998   "w/fracture repair"  . THYROID SURGERY  2000   "removed lots of nodules"  . TONSILLECTOMY  1976   Social History   Occupational History  . Food Therapist, nutritional    Social History Main Topics  . Smoking status: Former Smoker    Packs/day: 0.00  Years: 41.00    Types: Cigarettes    Quit date: 03/05/2015  . Smokeless tobacco: Never Used     Comment: 04/29/2015 "quit smoking cigarettes 02/27/2015"  . Alcohol use No  . Drug use: No  . Sexual activity: Not Currently    Birth control/ protection: Post-menopausal, Surgical

## 2016-09-21 ENCOUNTER — Telehealth: Payer: Self-pay | Admitting: Physician Assistant

## 2016-09-21 NOTE — Telephone Encounter (Signed)
Pt called requesting referral for back specialist. Pt said she discussed this with Philis Fendt last time she saw him when discussing her MRI. Pt also said that they have stopped prescribing Tramadol for more than 5 days at a time and she has been on that for more than 15 years. She was wondering if either she needs to be referred to pain management or if something else can be prescribed for this. Please advise. Thanks! Pt callback number is [phone].

## 2016-09-22 ENCOUNTER — Other Ambulatory Visit: Payer: Self-pay | Admitting: Family Medicine

## 2016-09-22 DIAGNOSIS — Z1231 Encounter for screening mammogram for malignant neoplasm of breast: Secondary | ICD-10-CM

## 2016-09-23 ENCOUNTER — Encounter: Payer: Self-pay | Admitting: Cardiology

## 2016-09-23 ENCOUNTER — Ambulatory Visit (INDEPENDENT_AMBULATORY_CARE_PROVIDER_SITE_OTHER): Payer: 59 | Admitting: Cardiology

## 2016-09-23 VITALS — BP 146/88 | HR 75 | Ht 64.0 in | Wt 179.8 lb

## 2016-09-23 DIAGNOSIS — E785 Hyperlipidemia, unspecified: Secondary | ICD-10-CM

## 2016-09-23 DIAGNOSIS — I10 Essential (primary) hypertension: Secondary | ICD-10-CM

## 2016-09-23 DIAGNOSIS — I25118 Atherosclerotic heart disease of native coronary artery with other forms of angina pectoris: Secondary | ICD-10-CM | POA: Diagnosis not present

## 2016-09-23 DIAGNOSIS — I481 Persistent atrial fibrillation: Secondary | ICD-10-CM | POA: Diagnosis not present

## 2016-09-23 DIAGNOSIS — I4819 Other persistent atrial fibrillation: Secondary | ICD-10-CM

## 2016-09-23 NOTE — Patient Instructions (Signed)
Medication Instructions:  Your physician recommends that you continue on your current medications as directed. Please refer to the Current Medication list given to you today.   Labwork: Your physician recommends that you return for FASTING lab work TOMORROW, May 18. You may come any time between 7:30AM and 5:00PM as long as you are fasting.   Testing/Procedures: None  Follow-Up: Your physician wants you to follow-up in: 6 months with Dr. Radford Pax. You will receive a reminder letter in the mail two months in advance. If you don't receive a letter, please call our office to schedule the follow-up appointment.   Any Other Special Instructions Will Be Listed Below (If Applicable).     If you need a refill on your cardiac medications before your next appointment, please call your pharmacy.

## 2016-09-23 NOTE — Telephone Encounter (Signed)
Lets have her come in.  I can refer her at that time and take over her tramadol.  Philis Fendt, MS, PA-C 12:25 PM, 09/23/2016

## 2016-09-23 NOTE — Progress Notes (Signed)
Cardiology Office Note    Date:  09/23/2016   ID:  Judith [name], Judith [name] Judith [name], MRN [id number]  PCP:  [name], MD  Cardiologist:  Fransico Him, MD   Chief Complaint  Patient presents with  . Coronary Artery Disease  . Hypertension  . Atrial Fibrillation  . Hyperlipidemia    History of Present Illness:  Judith [name] is a 66 y.o. female with a history of HTN, ASCAD with CTO of the RCA and high grade LCx/OM stenosis s/p DES x 2, hyperlipidemia, Type II DM, Hyperthyroidism on Methimazole, Persistent atrial fibrillation s/p multiple DCCV in the past x 3 who is on apixaban and Sotolol.  She was admitted 08/07/2016 with palpitations and was found to be in atrial fibrillation with RVR due to missing 3 days of Sotolol and in setting of recent GI illness and K+ of 3.5.  She underwent DCCV x 2 and later discharged home.  She is back on Sotolol and tolerating well.  She is here today for follow up and is doing well. She denies any chest pain or pressure, SOB, DOE, PND, orthopnea, dizziness, LE edema, Claudication or syncope.  She has not had any palpitations or breakthrough of afib since getting back on Sotolol.   Past Medical History:  Diagnosis Date  . Abnormal EKG 07/31/2013  . Arthritis    "hands" (03/06/2015)  . Asthma   . Carpal tunnel syndrome, bilateral   . Chronic kidney disease   . Complication of anesthesia    slow to wake up  . Coronary artery disease     2 v CAD with CTO of the RCA and high grade bifurcational LCx/OM stenosis. S/P PCI DES x 2 to the LCx/OM.  Marland Kitchen Diabetic peripheral neuropathy ([place]) "since 1996"  . GERD (gastroesophageal reflux disease)   . Goiter   . Headache    migraines prior to menopause  . History of shingles 06/01/2013  . Hyperlipidemia LDL goal <70 10/13/2015  . Hypertension   . Hyperthyroidism   . PAF (paroxysmal atrial fibrillation) ([place]) 04/29/2015   CHADS2VASC score of 4 now on Apixaban  . Pneumonia ~ 1976  . Tremors of nervous system     . Type II diabetes mellitus (HCC)    insulin dependent    Past Surgical History:  Procedure Laterality Date  . ABDOMINAL HYSTERECTOMY  1988   age 34; CERVICAL DYSPLASIA; ovaries intact.   . AMPUTATION Right 01/23/2016   Procedure: Right 3rd Ray Amputation;  Surgeon: [name], MD;  Location: [facility];  Service: Orthopedics;  Laterality: Right;  . AMPUTATION Right 02/13/2016   Procedure: Right Transmetatarsal Amputation;  Surgeon: [name], MD;  Location: [facility];  Service: Orthopedics;  Laterality: Right;  . CARDIAC CATHETERIZATION N/A 02/27/2015   Procedure: Left Heart Cath and Coronary Angiography;  Surgeon: [name], MD; LAD 40%, mCFX 80%, OM 70%, RCA 100% calcified       . CARDIAC CATHETERIZATION N/A 03/06/2015   Procedure: Coronary Stent Intervention;  Surgeon: [name], MD;  Location: [facility];  Service: Cardiovascular;  Laterality: N/A;  Mid CX 3.50x12 promus DES w/ 0% resdual and Prox OM1 2.50x20 promus DES w/ 20% residual  . CARPAL TUNNEL RELEASE Right Nov 2015  . CARPAL TUNNEL RELEASE Right 1992; 05/2014   [name]; [place]  . CESAREAN SECTION  1982; 1984  . FOOT NEUROMA SURGERY Bilateral 2000  . KNEE ARTHROSCOPY Right ~ 2003   "meniscus repair"  . SHOULDER OPEN ROTATOR  CUFF REPAIR Right 1996; 1998   "w/fracture repair"  . THYROID SURGERY  2000   "removed lots of nodules"  . TONSILLECTOMY  1976    Current Medications: Current Meds  Medication Sig  . albuterol (PROVENTIL HFA) 108 (90 Base) MCG/ACT inhaler Inhale 1-2 puffs into the lungs every 6 (six) hours as needed for wheezing or shortness of breath.  Marland Kitchen amLODipine (NORVASC) 5 MG tablet Take 1 tablet (5 mg total) by mouth daily.  Marland Kitchen apixaban (ELIQUIS) 5 MG TABS tablet Take 1 tablet (5 mg total) by mouth 2 (two) times daily.  Marland Kitchen atorvastatin (LIPITOR) 20 MG tablet Take 1 tablet (20 mg total) by mouth daily.  Marland Kitchen buPROPion (WELLBUTRIN SR) 150 MG 12 hr tablet Take 1 tablet (150 mg total) by mouth 2  (two) times daily.  . cephALEXin (KEFLEX) 500 MG capsule Take 1 capsule (500 mg total) by mouth 3 (three) times daily.  . clindamycin (CLEOCIN) 300 MG capsule Take 300 mg by mouth See admin instructions. Take 1 capsule (300 mg) one hour prior to dental appointment - last taken 02/05/16  . clopidogrel (PLAVIX) 75 MG tablet TAKE ONE TABLET BY MOUTH ONCE DAILY (Patient taking differently: TAKE 75 MG BY MOUTH ONCE DAILY)  . cyclobenzaprine (FLEXERIL) 5 MG tablet TAKE 1 TABLET BY MOUTH THREE TIMES DAILY AS NEEDED FOR MUSCLE SPASM  . FLUoxetine (PROZAC) 20 MG capsule Take 1 capsule (20 mg total) by mouth at bedtime.  . fluticasone (FLOVENT HFA) 44 MCG/ACT inhaler Inhale 2 puffs into the lungs 2 (two) times daily as needed (shortness of breath/ wheezing).   . gabapentin (NEURONTIN) 300 MG capsule TAKE 3 CAPSULES BY MOUTH AT morning AND 3 CAPSULES AT BEDTIME (Patient taking differently: Take 900 mg by mouth 2 (two) times daily. TAKE 3 CAPSULES BY MOUTH EVERY  MORNING AND 3 CAPSULES AT BEDTIME)  . insulin degludec (TRESIBA FLEXTOUCH) 100 UNIT/ML SOPN FlexTouch Pen Inject 0.2 mLs (20 Units total) into the skin at bedtime.  . insulin lispro (HUMALOG) 100 UNIT/ML injection 3 times a day (just before each meal) 15-25-10 units (Patient taking differently: Inject into the skin 3 (three) times daily with meals. 3 times a day (just before each meal) 15 units EVERY MORNING -25 units EVERY AFTERNOON-10 units EVERY EVENING)  . Insulin Pen Needle 32G X 4 MM MISC Use to inject insulin daily  . Insulin Syringe-Needle U-100 (INSULIN SYRINGE .5CC/31GX5/16") 31G X 5/16" 0.5 ML MISC Use to inject insulin  . losartan (COZAAR) 50 MG tablet TAKE ONE TABLET BY MOUTH ONCE DAILY (Patient taking differently: TAKE 50 MG BY MOUTH ONCE DAILY)  . metFORMIN (GLUCOPHAGE-XR) 500 MG 24 hr tablet Take 4 tablets (2,000 mg total) by mouth daily with breakfast. (Patient taking differently: Take 1,000 mg by mouth 2 (two) times daily. )  .  methimazole (TAPAZOLE) 10 MG tablet Take 1 tablet (10 mg total) by mouth daily.  . metoprolol succinate (TOPROL-XL) 25 MG 24 hr tablet Take 1 tablet (25 mg total) by mouth 2 (two) times daily. Take with or immediately following a meal.  . nitroGLYCERIN (NITRO-DUR) 0.8 MG/HR Place 1 patch onto the skin daily.  . nitroGLYCERIN (NITROSTAT) 0.4 MG SL tablet Place 1 tablet (0.4 mg total) under the tongue every 5 (five) minutes as needed for chest pain.  Marland Kitchen ondansetron (ZOFRAN) 4 MG tablet Take 1 tablet (4 mg total) by mouth every 8 (eight) hours as needed for nausea or vomiting.  . pantoprazole (PROTONIX) 40 MG tablet Take 1 tablet (  40 mg total) by mouth daily.  . silver sulfADIAZINE (SILVADENE) 1 % cream Apply 1 application topically daily.  . sotalol (BETAPACE) 80 MG tablet Take 1 tablet (80 mg total) by mouth every 12 (twelve) hours.  . traMADol (ULTRAM) 50 MG tablet TAKE ONE TABLET BY MOUTH EVERY 6 HOURS AS NEEDED FOR MODERATE PAIN. (MAX DOSE: 8 TABS PER DAY)  . triamcinolone (KENALOG) 0.1 % paste Use as directed 1 application in the mouth or throat daily as needed.    Allergies:   Dilaudid [hydromorphone hcl]; Novolog [insulin aspart]; Codeine; Iodine; Penicillins; Propofol; Ace inhibitors; Demerol [meperidine]; Neosporin [neomycin-bacitracin zn-polymyx]; Percocet [oxycodone-acetaminophen]; and Tape   Social History   Social History  . Marital status: Divorced    Spouse name: N/A  . Number of children: 2  . Years of education: Masters   Occupational History  . Food Therapist, nutritional    Social History Main Topics  . Smoking status: Former Smoker    Packs/day: 0.00    Years: 41.00    Types: Cigarettes    Quit date: 03/05/2015  . Smokeless tobacco: Never Used     Comment: 04/29/2015 "quit smoking cigarettes 02/27/2015"  . Alcohol use No  . Drug use: No  . Sexual activity: Not Currently    Birth control/ protection: Post-menopausal, Surgical   Other Topics Concern  . 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      Lives with sister in [place].      Tobacco: 1 ppd x 41 years - quit 2016      Alcohol: none      Drugs: none      Exercise:  Walking in neighborhood; physical job.   Right-handed.   2 cups caffeine daily.        Family History:  The patient's family history includes Allergies in her sister; Breast cancer in her maternal grandmother and mother; COPD in her father; Cancer (age of onset: 29) in her mother; Emphysema in her maternal grandfather and paternal grandfather; Heart attack in her father; Heart disease (age of onset: 29) in her father; Hypertension in her father; Leukemia in her paternal grandmother; Lung cancer in her mother; Parkinson's disease in her father.   ROS:   Please see the history of present illness.    ROS All other systems reviewed and are negative.  No flowsheet data found.     PHYSICAL EXAM:   VS:  BP (!) 146/88   Pulse 75   Ht 5\' 4"  (1.626 m)   Wt 179 lb 12.8 oz (81.6 kg)   SpO2 98%   BMI 30.86 kg/m    GEN: Well nourished, well developed, in no acute distress  HEENT: normal  Neck: no JVD, carotid bruits, or masses Cardiac: RRR; no murmurs, rubs, or gallops,no edema.  Intact distal pulses bilaterally.  Respiratory:  clear to auscultation bilaterally, normal work of breathing GI: soft, nontender, nondistended, + BS MS: no deformity or atrophy  Skin: warm and dry, no rash Neuro:  Alert and Oriented x 3, Strength and sensation are intact Psych: euthymic mood, full affect  Wt Readings from Last 3 Encounters:  09/23/16 179 lb 12.8 oz (81.6 kg)  09/09/16 181 lb (82.1 kg)  09/04/16 181 lb (82.1 kg)  Studies/Labs Reviewed:   EKG:  EKG is not  ordered today.   Recent Labs: 11/15/2015: B  Natriuretic Peptide 62.6 11/18/2015: Magnesium 1.7 05/09/2016: ALT 15 08/06/2016: BUN 15; Creatinine, Ser 0.81; Hemoglobin 11.7; Platelets 374; Potassium 3.5; Sodium 138; TSH 1.471   Lipid Panel    Component Value Date/Time   CHOL 187 02/05/2016 1047   TRIG 380 (H) 02/05/2016 1047   HDL 32 (L) 02/05/2016 1047   CHOLHDL 5.8 (H) 02/05/2016 1047   VLDL 76 (H) 02/05/2016 1047   LDLCALC 79 02/05/2016 1047    Additional studies/ records that were reviewed today include:  none    ASSESSMENT:    1. Coronary artery disease of native artery of native heart with stable angina pectoris (HCC)   2. Persistent atrial fibrillation ([place])   3. Essential hypertension   4. Hyperlipidemia LDL goal <70      PLAN:  In order of problems listed above:  1. ASCAD with CTO of the RCA and high grade LCx/OM stenosis s/p DES x 2.  She has chronic stable angina and has not had any chest pain recently. She will continue on Plavix 75mg  daily.  She is not on ASA due to NOAC. She will continue on statin and BB.   2. Persistent atrial fibrillation - she is maintaining NSR on Sotolol 80mg  BID and Toprol XL 25mg  daily.  QTc 444msec on EKG 08/13/2016.  3. HTN - her BP is borderline controlled on exam today.  She will continue on amlodipine 5mg  daily, Cozaar 50mg  daily and Toprol XL 25mg  daily. I will check a BMET.. 4.  5. Hyperlipidemia with LDL goal < 70.  She will continue on Atorvastatin 20mg  daily.  I will get an FLP and ALT.      Medication Adjustments/Labs and Tests Ordered: Current medicines are reviewed at length with the patient today.  Concerns regarding medicines are outlined above.  Medication changes, Labs and Tests ordered today are listed in the Patient Instructions below.  There are no Patient Instructions on file for this visit.   Signed, Fransico Him, MD  09/23/2016 12:06 PM    [address], [place], [place]  [address] Phone: [phone]; Fax: [phone]

## 2016-09-23 NOTE — Telephone Encounter (Signed)
Last seen 09/04/16 Please advise if referral placed

## 2016-09-23 NOTE — Telephone Encounter (Signed)
Pt advised and will call back for an appt.

## 2016-09-24 ENCOUNTER — Other Ambulatory Visit: Payer: 59 | Admitting: *Deleted

## 2016-09-24 DIAGNOSIS — E785 Hyperlipidemia, unspecified: Secondary | ICD-10-CM

## 2016-09-24 DIAGNOSIS — I4819 Other persistent atrial fibrillation: Secondary | ICD-10-CM

## 2016-09-24 DIAGNOSIS — I10 Essential (primary) hypertension: Secondary | ICD-10-CM

## 2016-09-24 DIAGNOSIS — I25118 Atherosclerotic heart disease of native coronary artery with other forms of angina pectoris: Secondary | ICD-10-CM

## 2016-09-24 LAB — HEPATIC FUNCTION PANEL
ALBUMIN: 4 g/dL (ref 3.6–4.8)
ALK PHOS: 97 IU/L (ref 39–117)
ALT: 6 IU/L (ref 0–32)
AST: 13 IU/L (ref 0–40)
BILIRUBIN, DIRECT: 0.07 mg/dL (ref 0.00–0.40)
TOTAL PROTEIN: 6.8 g/dL (ref 6.0–8.5)

## 2016-09-24 LAB — BASIC METABOLIC PANEL
BUN/Creatinine Ratio: 35 — ABNORMAL HIGH (ref 12–28)
BUN: 23 mg/dL (ref 8–27)
CALCIUM: 9.5 mg/dL (ref 8.7–10.3)
CO2: 25 mmol/L (ref 18–29)
CREATININE: 0.66 mg/dL (ref 0.57–1.00)
Chloride: 96 mmol/L (ref 96–106)
GFR calc Af Amer: 108 mL/min/{1.73_m2} (ref >59)
GFR calc non Af Amer: 94 mL/min/{1.73_m2} (ref >59)
GLUCOSE: 188 mg/dL — AB (ref 65–99)
Potassium: 4.4 mmol/L (ref 3.5–5.2)
SODIUM: 138 mmol/L (ref 134–144)

## 2016-09-24 LAB — LIPID PANEL
CHOL/HDL RATIO: 8.3 ratio — AB (ref 0.0–4.4)
Cholesterol, Total: 249 mg/dL — ABNORMAL HIGH (ref 100–199)
HDL: 30 mg/dL — ABNORMAL LOW (ref >39)
Triglycerides: 841 mg/dL (ref 0–149)

## 2016-09-24 NOTE — Addendum Note (Signed)
Addended by: Eulis Foster on: 09/24/2016 08:27 AM   Modules accepted: Orders

## 2016-09-24 NOTE — Addendum Note (Signed)
Addended by: Eulis Foster on: 09/24/2016 08:21 AM   Modules accepted: Orders

## 2016-09-27 ENCOUNTER — Telehealth: Payer: Self-pay | Admitting: Cardiology

## 2016-09-27 DIAGNOSIS — E785 Hyperlipidemia, unspecified: Secondary | ICD-10-CM

## 2016-09-27 MED ORDER — FENOFIBRATE 145 MG PO TABS: 145.0000 mg | ORAL_TABLET | Freq: Every day | ORAL | 3 refills | Status: DC

## 2016-09-27 NOTE — Telephone Encounter (Signed)
-----   Message from Leeroy Bock, [facility] sent at 09/27/2016  8:24 AM EDT ----- Recommend starting fenofibrate 145mg  daily with food and advise pt to limit intake of sugars, carbs, and alcohol.  Recheck lipids with direct LDL in 3 months.

## 2016-09-27 NOTE — Telephone Encounter (Signed)
Informed patient of results and verbal understanding expressed.  Instructed patient to START FENOFIBRATE 145 mg daily. She understands to limit intake of sugars, carbs and alcohol. FLP and direct LDL scheduled 8/31. Patient agrees with treatment plan.

## 2016-09-27 NOTE — Telephone Encounter (Signed)
Follow Up:   Returning your call from last week.t

## 2016-09-28 ENCOUNTER — Encounter: Payer: Self-pay | Admitting: Physician Assistant

## 2016-09-28 ENCOUNTER — Ambulatory Visit (INDEPENDENT_AMBULATORY_CARE_PROVIDER_SITE_OTHER): Payer: 59 | Admitting: Endocrinology

## 2016-09-28 ENCOUNTER — Ambulatory Visit (INDEPENDENT_AMBULATORY_CARE_PROVIDER_SITE_OTHER): Payer: 59 | Admitting: Physician Assistant

## 2016-09-28 ENCOUNTER — Encounter: Payer: Self-pay | Admitting: Endocrinology

## 2016-09-28 VITALS — BP 135/75 | HR 79 | Temp 98.0°F | Resp 16 | Wt 179.2 lb

## 2016-09-28 VITALS — BP 128/86 | HR 74 | Ht 64.0 in | Wt 179.0 lb

## 2016-09-28 DIAGNOSIS — E1101 Type 2 diabetes mellitus with hyperosmolarity with coma: Secondary | ICD-10-CM | POA: Diagnosis not present

## 2016-09-28 DIAGNOSIS — G8929 Other chronic pain: Secondary | ICD-10-CM | POA: Diagnosis not present

## 2016-09-28 DIAGNOSIS — M546 Pain in thoracic spine: Secondary | ICD-10-CM | POA: Diagnosis not present

## 2016-09-28 DIAGNOSIS — E0590 Thyrotoxicosis, unspecified without thyrotoxic crisis or storm: Secondary | ICD-10-CM

## 2016-09-28 DIAGNOSIS — Z794 Long term (current) use of insulin: Secondary | ICD-10-CM

## 2016-09-28 LAB — POCT GLYCOSYLATED HEMOGLOBIN (HGB A1C): Hemoglobin A1C: 8.7

## 2016-09-28 LAB — TSH: TSH: 0.9 u[IU]/mL (ref 0.35–4.50)

## 2016-09-28 MED ORDER — TRAMADOL HCL 50 MG PO TABS: 50.0000 mg | ORAL_TABLET | Freq: Four times a day (QID) | ORAL | 1 refills | Status: DC | PRN

## 2016-09-28 NOTE — Progress Notes (Signed)
09/28/2016 2:08 PM   DOB: 05-18-1950 / MRN: [id number]  SUBJECTIVE:  Judith [name] is a 66 y.o. female presenting for a history of chronic thoracic back pain with radiation to the right side. She descirbes it as a stab or an ache.  The pain is made worse with movement. She gets good relief with tramadol but has been advised that she needs to find one provider to fill this for her.  She generally takes 2 daily however at times she may required up to one every 6 hours.   She is allergic to dilaudid [hydromorphone hcl]; novolog [insulin aspart]; codeine; iodine; penicillins; propofol; ace inhibitors; demerol [meperidine]; neosporin [neomycin-bacitracin zn-polymyx]; percocet [oxycodone-acetaminophen]; and tape.   She  has a past medical history of Abnormal EKG (07/31/2013); Arthritis; Asthma; Carpal tunnel syndrome, bilateral; Chronic kidney disease; Complication of anesthesia; Coronary artery disease; Diabetic peripheral neuropathy (HCC) ("since 1996"); GERD (gastroesophageal reflux disease); Goiter; Headache; History of shingles (06/01/2013); Hyperlipidemia LDL goal <70 (10/13/2015); Hypertension; Hyperthyroidism; PAF (paroxysmal atrial fibrillation) ([place]) (04/29/2015); Pneumonia (~ 1976); Tremors of nervous system; and Type II diabetes mellitus ([place]).    She  reports that she quit smoking about 18 months ago. Her smoking use included Cigarettes. She smoked 0.00 packs per day for 41.00 years. She has never used smokeless tobacco. She reports that she does not drink alcohol or use drugs. She  reports that she does not currently engage in sexual activity. She reports using the following methods of birth control/protection: Post-menopausal and Surgical. The patient  has a past surgical history that includes Tonsillectomy (1976); Cesarean section (2542; 1984); Carpal tunnel release (Right, Nov 2015); Knee arthroscopy (Right, ~ 2003); Shoulder open rotator cuff repair (Right, 1996; 1998); Foot neuroma surgery  (Bilateral, 2000); Carpal tunnel release (Right, 1992; 05/2014); Thyroid surgery (2000); Cardiac catheterization (N/A, 02/27/2015); Cardiac catheterization (N/A, 03/06/2015); Abdominal hysterectomy (1988); Amputation (Right, 01/23/2016); and Amputation (Right, 02/13/2016).  Her family history includes Allergies in her sister; Breast cancer in her maternal grandmother and mother; COPD in her father; Cancer (age of onset: 46) in her mother; Emphysema in her maternal grandfather and paternal grandfather; Heart attack in her father; Heart disease (age of onset: 41) in her father; Hypertension in her father; Leukemia in her paternal grandmother; Lung cancer in her mother; Parkinson's disease in her father.  Review of Systems  Musculoskeletal: Positive for back pain and myalgias. Negative for falls, joint pain and neck pain.  Neurological: Negative for dizziness.    The problem list and medications were reviewed and updated by myself where necessary and exist elsewhere in the encounter.   OBJECTIVE:  BP 135/75 (BP Location: Right Arm, Patient Position: Sitting, Cuff Size: Normal)   Pulse 79   Temp 98 F (36.7 C) (Oral)   Resp 16   Wt 179 lb 3.2 oz (81.3 kg)   SpO2 97%   BMI 30.76 kg/m   Physical Exam  Constitutional: She is oriented to person, place, and time. She is active.  Non-toxic appearance.  Cardiovascular: Normal rate, regular rhythm, S1 normal, S2 normal, normal heart sounds and intact distal pulses.  Exam reveals no gallop, no friction rub and no decreased pulses.   No murmur heard. Pulmonary/Chest: Effort normal. No stridor. No tachypnea. No respiratory distress. She has no wheezes. She has no rales.  Abdominal: She exhibits no distension.  Musculoskeletal: She exhibits no edema.  Neurological: She is alert and oriented to person, place, and time.  Skin: Skin is warm and dry. She is not diaphoretic.  No pallor.    Results for orders placed or performed in visit on 09/28/16 (from the  past 72 hour(s))  POCT glycosylated hemoglobin (Hb A1C)     Status: None   Collection Time: 09/28/16 12:58 PM  Result Value Ref Range   Hemoglobin A1C 8.7     No results found.  ASSESSMENT AND PLAN:  Elease was seen today for follow-up.  Diagnoses and all orders for this visit:  Chronic right-sided thoracic back pain: Recent MRI showing thoracic DJD. Advised that we continue to the tramadol. Given history of poorly controlled diabetes she is a poor candidate for NSAIDS.  She has tried and failed tylenol.  If her pain eventually requires more than tramadol she agrees to go to pain management.  She also agrees today to receive tramadol from me and me alone. REferral to orthopedics is in.     The patient is advised to call or return to clinic if she does not see an improvement in symptoms, or to seek the care of the closest emergency department if she worsens with the above plan.   Philis Fendt, MHS, PA-C [facility] 09/28/2016 2:08 PM

## 2016-09-28 NOTE — Patient Instructions (Signed)
     IF you received an x-ray today, you will receive an invoice from [facility]. Please contact [facility] at [phone] with questions or concerns regarding your invoice.   IF you received labwork today, you will receive an invoice from [facility]. Please contact [facility] at [phone] with questions or concerns regarding your invoice.   Our billing staff will not be able to assist you with questions regarding bills from these companies.  You will be contacted with the lab results as soon as they are available. The fastest way to get your results is to activate your My Chart account. Instructions are located on the last page of this paperwork. If you have not heard from us regarding the results in 2 weeks, please contact this office.     

## 2016-09-28 NOTE — Progress Notes (Signed)
Subjective:    Patient ID: Judith [name], female    DOB: 1950/08/01, 66 y.o.   MRN: [id number]  HPI Pt returns for f/u of hyperthyroidism (pt says she took synthroid for a brief time in the 1990's, in an attempt to shrink a goiter; slightly suppressed TSH was first noted in late 2016, when she was in the hospital for new-onset AF; she converted back to SR; nuc med scan showed heterogeneous uptake; neck scar is from C-spine procedure). she takes tapazole as rx'ed. pt states she feels better in general, since recent amputation.  Pt also requests rx for DM: DM type: Insulin-requiring type 2. Dx'ed: 7858 Complications: polyneuropathy, nephropathy, retinopathy, and CAD.  Therapy: insulin since 1997, and metformin GDM: never DKA: never Severe hypoglycemia: never.   Pancreatitis: never.   Other: she takes multiple daily injections.  Interval history: no cbg record, but states cbg's vary from 83-289.  It is in general higher as the day goes on.  She take humalog 3 times a day (just before each meal) 02-16-14 units, and tresiba She will lose her health insurance soon.   She was recently in the hosp with AF (she had run out of her sotalol) Past Medical History:  Diagnosis Date  . Abnormal EKG 07/31/2013  . Arthritis    "hands" (03/06/2015)  . Asthma   . Carpal tunnel syndrome, bilateral   . Chronic kidney disease   . Complication of anesthesia    slow to wake up  . Coronary artery disease     2 v CAD with CTO of the RCA and high grade bifurcational LCx/OM stenosis. S/P PCI DES x 2 to the LCx/OM.  Marland Kitchen Diabetic peripheral neuropathy ([place]) "since 1996"  . GERD (gastroesophageal reflux disease)   . Goiter   . Headache    migraines prior to menopause  . History of shingles 06/01/2013  . Hyperlipidemia LDL goal <70 10/13/2015  . Hypertension   . Hyperthyroidism   . PAF (paroxysmal atrial fibrillation) ([place]) 04/29/2015   CHADS2VASC score of 4 now on Apixaban  . Pneumonia ~ 1976  . Tremors of  nervous system   . Type II diabetes mellitus (HCC)    insulin dependent    Past Surgical History:  Procedure Laterality Date  . ABDOMINAL HYSTERECTOMY  1988   age 70; CERVICAL DYSPLASIA; ovaries intact.   . AMPUTATION Right 01/23/2016   Procedure: Right 3rd Ray Amputation;  Surgeon: [name], MD;  Location: [place] [facility];  Service: Orthopedics;  Laterality: Right;  . AMPUTATION Right 02/13/2016   Procedure: Right Transmetatarsal Amputation;  Surgeon: [name], MD;  Location: [facility];  Service: Orthopedics;  Laterality: Right;  . CARDIAC CATHETERIZATION N/A 02/27/2015   Procedure: Left Heart Cath and Coronary Angiography;  Surgeon: [name], MD; LAD 40%, mCFX 80%, OM 70%, RCA 100% calcified       . CARDIAC CATHETERIZATION N/A 03/06/2015   Procedure: Coronary Stent Intervention;  Surgeon: [name], MD;  Location: [facility];  Service: Cardiovascular;  Laterality: N/A;  Mid CX 3.50x12 promus DES w/ 0% resdual and Prox OM1 2.50x20 promus DES w/ 20% residual  . CARPAL TUNNEL RELEASE Right Nov 2015  . CARPAL TUNNEL RELEASE Right 1992; 05/2014   [name]; [place]  . CESAREAN SECTION  1982; 1984  . FOOT NEUROMA SURGERY Bilateral 2000  . KNEE ARTHROSCOPY Right ~ 2003   "meniscus repair"  . SHOULDER OPEN ROTATOR CUFF REPAIR Right 1996; 1998   "w/fracture repair"  .  THYROID SURGERY  2000   "removed lots of nodules"  . TONSILLECTOMY  1976    Social History   Social History  . Marital status: Divorced    Spouse name: N/A  . Number of children: 2  . Years of education: Masters   Occupational History  . Food Therapist, nutritional    Social History Main Topics  . Smoking status: Former Smoker    Packs/day: 0.00    Years: 41.00    Types: Cigarettes    Quit date: 03/05/2015  . Smokeless tobacco: Never Used     Comment: 04/29/2015 "quit smoking cigarettes 02/27/2015"  . Alcohol use No  . Drug use: No  . Sexual activity: Not Currently    Birth control/ protection:  Post-menopausal, Surgical   Other Topics Concern  . 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      Lives with sister in [place].      Tobacco: 1 ppd x 41 years - quit 2016      Alcohol: none      Drugs: none      Exercise:  Walking in neighborhood; physical job.   Right-handed.   2 cups caffeine daily.       Current Outpatient Prescriptions on File Prior to Visit  Medication Sig Dispense Refill  . albuterol (PROVENTIL HFA) 108 (90 Base) MCG/ACT inhaler Inhale 1-2 puffs into the lungs every 6 (six) hours as needed for wheezing or shortness of breath. 1 Inhaler 0  . amLODipine (NORVASC) 5 MG tablet Take 1 tablet (5 mg total) by mouth daily. 30 tablet 11  . apixaban (ELIQUIS) 5 MG TABS tablet Take 1 tablet (5 mg total) by mouth 2 (two) times daily. 60 tablet 11  . atorvastatin (LIPITOR) 20 MG tablet Take 1 tablet (20 mg total) by mouth daily. 90 tablet 1  . buPROPion (WELLBUTRIN SR) 150 MG 12 hr tablet Take 1 tablet (150 mg total) by mouth 2 (two) times daily. 180 tablet 3  . clindamycin (CLEOCIN) 300 MG capsule Take 300 mg by mouth See admin instructions. Take 1 capsule (300 mg) one hour prior to dental appointment - last taken 02/05/16    . clopidogrel (PLAVIX) 75 MG tablet TAKE ONE TABLET BY MOUTH ONCE DAILY (Patient taking differently: TAKE 75 MG BY MOUTH ONCE DAILY) 90 tablet 3  . cyclobenzaprine (FLEXERIL) 5 MG tablet TAKE 1 TABLET BY MOUTH THREE TIMES DAILY AS NEEDED FOR MUSCLE SPASM 30 tablet 0  . fenofibrate (TRICOR) 145 MG tablet Take 1 tablet (145 mg total) by mouth daily. 90 tablet 3  . FLUoxetine (PROZAC) 20 MG capsule Take 1 capsule (20 mg total) by mouth at bedtime. 90 capsule 3  . fluticasone (FLOVENT HFA) 44 MCG/ACT inhaler Inhale 2 puffs into  the lungs 2 (two) times daily as needed (shortness of breath/ wheezing).     . gabapentin (NEURONTIN) 300 MG capsule TAKE 3 CAPSULES BY MOUTH AT morning AND 3 CAPSULES AT BEDTIME (Patient taking differently: Take 900 mg by mouth 2 (two) times daily. TAKE 3 CAPSULES BY MOUTH EVERY  MORNING AND 3 CAPSULES AT BEDTIME) 540 capsule 1  . insulin degludec (TRESIBA FLEXTOUCH) 100 UNIT/ML SOPN FlexTouch Pen Inject 0.2 mLs (20 Units total) into the skin  at bedtime. 5 pen 11  . insulin lispro (HUMALOG) 100 UNIT/ML injection 3 times a day (just before each meal) 15-25-10 units (Patient taking differently: Inject into the skin 3 (three) times daily with meals. 3 times a day (just before each meal) 15 units EVERY MORNING -25 units EVERY AFTERNOON-10 units EVERY EVENING) 30 mL 11  . Insulin Pen Needle 32G X 4 MM MISC Use to inject insulin daily 100 each 5  . Insulin Syringe-Needle U-100 (INSULIN SYRINGE .5CC/31GX5/16") 31G X 5/16" 0.5 ML MISC Use to inject insulin 100 each 5  . losartan (COZAAR) 50 MG tablet TAKE ONE TABLET BY MOUTH ONCE DAILY (Patient taking differently: TAKE 50 MG BY MOUTH ONCE DAILY) 90 tablet 3  . metFORMIN (GLUCOPHAGE-XR) 500 MG 24 hr tablet Take 4 tablets (2,000 mg total) by mouth daily with breakfast. (Patient taking differently: Take 1,000 mg by mouth 2 (two) times daily. ) 360 tablet 3  . methimazole (TAPAZOLE) 10 MG tablet Take 1 tablet (10 mg total) by mouth daily. 30 tablet 11  . metoprolol succinate (TOPROL-XL) 25 MG 24 hr tablet Take 1 tablet (25 mg total) by mouth 2 (two) times daily. Take with or immediately following a meal. 60 tablet 11  . nitroGLYCERIN (NITROSTAT) 0.4 MG SL tablet Place 1 tablet (0.4 mg total) under the tongue every 5 (five) minutes as needed for chest pain. 25 tablet 3  . ondansetron (ZOFRAN) 4 MG tablet Take 1 tablet (4 mg total) by mouth every 8 (eight) hours as needed for nausea or vomiting. 20 tablet 0  . pantoprazole (PROTONIX) 40 MG tablet Take 1 tablet (40 mg  total) by mouth daily. 90 tablet 3  . silver sulfADIAZINE (SILVADENE) 1 % cream Apply 1 application topically daily. 400 g 0  . sotalol (BETAPACE) 80 MG tablet Take 1 tablet (80 mg total) by mouth every 12 (twelve) hours. 60 tablet 11  . triamcinolone (KENALOG) 0.1 % paste Use as directed 1 application in the mouth or throat daily as needed.    . nitroGLYCERIN (NITRO-DUR) 0.8 MG/HR Place 1 patch onto the skin daily.     No current facility-administered medications on file prior to visit.     Allergies  Allergen Reactions  . Dilaudid [Hydromorphone Hcl] Other (See Comments)    HEADACHE  . Novolog [Insulin Aspart] Shortness Of Breath    "breathing problems"  . Codeine Nausea And Vomiting    HIGH DOSES-SEVERE VOMITING  . Iodine Other (See Comments)    MUST HAVE BENADRYL PRIOR TO PROCEDURE AND RIGHT BEFORE TREATMENT TO COUNTERACT REACTION-BLISTERING REACTION DERMATOLOGICAL  . Penicillins Itching, Rash and Other (See Comments)    Has patient had a PCN reaction causing immediate rash, facial/tongue/throat swelling, SOB or lightheadedness with hypotension: Yes Has patient had a PCN reaction causing severe rash involving mucus membranes or skin necrosis: No Has patient had a PCN reaction that required hospitalization No Has patient had a PCN reaction occurring within the last 10 years: No If all of the above answers are "NO", then may proceed with Cephalosporin use.  CHEST SIZED RASH AND ITCHING   . Propofol Other (See Comments)    "Breathing problems - asthma attack"  . Ace Inhibitors Cough  . Demerol [Meperidine] Nausea And Vomiting  . Neosporin [Neomycin-Bacitracin Zn-Polymyx] Itching and Rash    MAKES REACTIONS WORSE WHEN USING AS PROPHYLACTIC  . Percocet [Oxycodone-Acetaminophen] Rash  . Tape Itching and Rash    Family History  Problem Relation Age of Onset  .  Cancer Mother 73       bronchial cancer  . Breast cancer Mother   . Lung cancer Mother   . Hypertension Father   .  COPD Father   . Heart disease Father 81       CAD with cardiac stenting  . Heart attack Father   . Parkinson's disease Father   . Allergies Sister   . Breast cancer Maternal Grandmother   . Emphysema Maternal Grandfather   . Leukemia Paternal Grandmother   . Emphysema Paternal Grandfather   . Thyroid disease Neg Hx     BP 128/86   Pulse 74   Ht 5\' 4"  (1.626 m)   Wt 179 lb (81.2 kg)   SpO2 96%   BMI 30.73 kg/m    Review of Systems She denies hypoglycemia.     Objective:   Physical Exam VITAL SIGNS:  See vs page GENERAL: no distress.  In wheelchair Pulses: left dorsalis pedis intact.   MSK: no deformity of the feet, except for right transmetatarsal amputation.   CV: 1+ bilat leg edema.   Skin: normal color and temp on the feet.  Neuro: sensation is intact to touch on the feet, but severely decreased from normal Ext: There is onychomycosis of the left foot toenails.   Lab Results  Component Value Date   CREATININE 0.66 09/24/2016   BUN 23 09/24/2016   NA 138 09/24/2016   K 4.4 09/24/2016   CL 96 09/24/2016   CO2 25 09/24/2016    Lab Results  Component Value Date   HGBA1C 8.7 09/28/2016   Lab Results  Component Value Date   TSH 0.90 09/28/2016   T4TOTAL 7.8 04/30/2015      Assessment & Plan:  Insulin-requiring type 2 DM, with CAD: worse Hyperthyroidism: weight loss  Patient Instructions  Thyroid blood tests are requested for you today.  Please do anywhere you want.  We'll let you know about the results. if ever you have fever while taking methimazole, stop it and call us, even if the reason is obvious, because of the risk of a rare side-effect. check your blood sugar twice a day.  vary the time of day when you check, between before the 3 meals, and at bedtime.  also check if you have symptoms of your blood sugar being too high or too low.  please keep a record of the readings and bring it to your next appointment here (or you can bring the meter itself).   You can write it on any piece of paper.  please call us sooner if your blood sugar goes below 70, or if you have a lot of readings over 200.  Please take the insulin numbers listed below. If you are unable to buy insulin, take regular from [facility] instead of humalog (same number of units), and take NPH 15 units at bedtime instead of the tresiba.  blood tests are requested for you today.  We'll let you know about the results.  Please come back for a follow-up appointment in 4 months.

## 2016-09-28 NOTE — Patient Instructions (Addendum)
Thyroid blood tests are requested for you today.  Please do anywhere you want.  We'll let you know about the results. if ever you have fever while taking methimazole, stop it and call us, even if the reason is obvious, because of the risk of a rare side-effect. check your blood sugar twice a day.  vary the time of day when you check, between before the 3 meals, and at bedtime.  also check if you have symptoms of your blood sugar being too high or too low.  please keep a record of the readings and bring it to your next appointment here (or you can bring the meter itself).  You can write it on any piece of paper.  please call us sooner if your blood sugar goes below 70, or if you have a lot of readings over 200.  Please take the insulin numbers listed below. If you are unable to buy insulin, take regular from [facility] instead of humalog (same number of units), and take NPH 15 units at bedtime instead of the tresiba.  blood tests are requested for you today.  We'll let you know about the results.  Please come back for a follow-up appointment in 4 months.

## 2016-09-30 ENCOUNTER — Other Ambulatory Visit: Payer: Self-pay | Admitting: Physician Assistant

## 2016-10-07 ENCOUNTER — Ambulatory Visit (INDEPENDENT_AMBULATORY_CARE_PROVIDER_SITE_OTHER): Payer: 59 | Admitting: Family

## 2016-10-07 ENCOUNTER — Encounter (INDEPENDENT_AMBULATORY_CARE_PROVIDER_SITE_OTHER): Payer: Self-pay | Admitting: Orthopedic Surgery

## 2016-10-07 ENCOUNTER — Ambulatory Visit: Payer: Self-pay

## 2016-10-07 VITALS — Ht 64.0 in | Wt 179.0 lb

## 2016-10-07 DIAGNOSIS — Z89431 Acquired absence of right foot: Secondary | ICD-10-CM | POA: Diagnosis not present

## 2016-10-07 DIAGNOSIS — E1142 Type 2 diabetes mellitus with diabetic polyneuropathy: Secondary | ICD-10-CM | POA: Diagnosis not present

## 2016-10-07 NOTE — Progress Notes (Signed)
Office Visit Note   Patient: Judith [name]           Date of Birth: April 19, 1951           MRN: [id number] Visit Date: 10/07/2016              Requested by: [name], MD [address] [place], [place] [address] PCP: [name], MD  Chief Complaint  Patient presents with  . Left Leg - Follow-up  . Right Leg - Follow-up      HPI: Patient is status post right transmetatarsal amputation approximately 6 months ago. Patient has recently been just started on Keflex 3 times a day on 09/04/2016 due to a recent abrasion to her transmetatarsal amputation. Patient was concerned of infection. She denies any fever or chills  Assessment & Plan: Visit Diagnoses:  1. S/P transmetatarsal amputation of foot, right ([place])   2. Diabetic peripheral neuropathy (HCC)     Plan: Denies any drainage or redness or swelling. She will complete her course of oral antibiotics. She will follow with [employer] for her shoe and bracing. She will follow up in the office once she obtains that shoe and bracing.  Follow-Up Instructions: Return if symptoms worsen or fail to improve.   Ortho Exam  Patient is alert, oriented, no adenopathy, well-dressed, normal affect, normal respiratory effort. Patient is ambulating and unstable with regular shoewear on her right foot. Examination she has good dorsiflexion and plantar flexion. The incision is well-healed there is no cellulitis no drainage no signs of infection. Her previous ulcer has healed. There is no hypertrophic callus.  Imaging: No results found.  Labs: Lab Results  Component Value Date   HGBA1C 8.7 09/28/2016   HGBA1C 5.8 03/16/2016   HGBA1C 8.8 01/15/2016   REPTSTATUS 02/14/2015 FINAL 02/12/2015   CULT  02/12/2015    >=100,000 COLONIES/mL GROUP B STREP(S.AGALACTIAE)ISOLATED TESTING AGAINST S. AGALACTIAE NOT ROUTINELY PERFORMED DUE TO PREDICTABILITY OF AMP/PEN/VAN SUSCEPTIBILITY.    LABORGA Multiple bacterial morphotypes present, none 12/06/2014    LABORGA predominant. Suggest appropriate recollection if  12/06/2014   LABORGA clinically indicated. 12/06/2014    Orders:  No orders of the defined types were placed in this encounter.  No orders of the defined types were placed in this encounter.    Procedures: No procedures performed  Clinical Data: No additional findings.  ROS:  All other systems negative, except as noted in the HPI. Review of Systems  Constitutional: Negative for chills and fever.  Musculoskeletal: Negative for gait problem.  Skin: Negative for color change and wound.    Objective: Vital Signs: Ht 5\' 4"  (1.626 m)   Wt 179 lb (81.2 kg)   BMI 30.73 kg/m   Specialty Comments:  No specialty comments available.  PMFS History: Patient Active Problem List   Diagnosis Date Noted  . SBO (small bowel obstruction) ([place]) 05/10/2016  . S/P transmetatarsal amputation of foot, right ([place]) 04/19/2016  . Obstructive sleep apnea 11/26/2015  . Bilateral carpal tunnel syndrome 11/26/2015  . Hypomagnesemia 11/16/2015  . Hyperlipidemia LDL goal <70 10/13/2015  . Abnormality of gait 09/02/2015  . Memory loss 08/12/2015  . Diabetic peripheral neuropathy ([place]) 08/12/2015  . Vitamin D deficiency 08/12/2015  . Hyperthyroidism 04/30/2015  . Heme positive stool   . Persistent atrial fibrillation ([place]) 04/29/2015  . Coronary artery disease with stable angina pectoris ([place]) 03/29/2015  . Abnormal nuclear stress test   . Acute kidney injury ([place]) 02/12/2015  . History of goiter 09/28/2014  .  GERD (gastroesophageal reflux disease) 07/31/2013  . Depression with anxiety 06/01/2013  . DM2 (diabetes mellitus, type 2) ([place]) 01/30/2013  . HTN (hypertension) 01/30/2013  . Tobacco user 01/30/2013   Past Medical History:  Diagnosis Date  . Abnormal EKG 07/31/2013  . Arthritis    "hands" (03/06/2015)  . Asthma   . Carpal tunnel syndrome, bilateral   . Chronic kidney disease   . Complication of anesthesia    slow to  wake up  . Coronary artery disease     2 v CAD with CTO of the RCA and high grade bifurcational LCx/OM stenosis. S/P PCI DES x 2 to the LCx/OM.  Marland Kitchen Diabetic peripheral neuropathy ([place]) "since 1996"  . GERD (gastroesophageal reflux disease)   . Goiter   . Headache    migraines prior to menopause  . History of shingles 06/01/2013  . Hyperlipidemia LDL goal <70 10/13/2015  . Hypertension   . Hyperthyroidism   . PAF (paroxysmal atrial fibrillation) ([place]) 04/29/2015   CHADS2VASC score of 4 now on Apixaban  . Pneumonia ~ 1976  . Tremors of nervous system   . Type II diabetes mellitus (HCC)    insulin dependent    Family History  Problem Relation Age of Onset  . Cancer Mother 77       bronchial cancer  . Breast cancer Mother   . Lung cancer Mother   . Hypertension Father   . COPD Father   . Heart disease Father 59       CAD with cardiac stenting  . Heart attack Father   . Parkinson's disease Father   . Allergies Sister   . Breast cancer Maternal Grandmother   . Emphysema Maternal Grandfather   . Leukemia Paternal Grandmother   . Emphysema Paternal Grandfather   . Thyroid disease Neg Hx     Past Surgical History:  Procedure Laterality Date  . ABDOMINAL HYSTERECTOMY  1988   age 82; CERVICAL DYSPLASIA; ovaries intact.   . AMPUTATION Right 01/23/2016   Procedure: Right 3rd Ray Amputation;  Surgeon: [name], MD;  Location: [facility];  Service: Orthopedics;  Laterality: Right;  . AMPUTATION Right 02/13/2016   Procedure: Right Transmetatarsal Amputation;  Surgeon: [name], MD;  Location: [facility];  Service: Orthopedics;  Laterality: Right;  . CARDIAC CATHETERIZATION N/A 02/27/2015   Procedure: Left Heart Cath and Coronary Angiography;  Surgeon: [name], MD; LAD 40%, mCFX 80%, OM 70%, RCA 100% calcified       . CARDIAC CATHETERIZATION N/A 03/06/2015   Procedure: Coronary Stent Intervention;  Surgeon: [name], MD;  Location: [facility];  Service: Cardiovascular;   Laterality: N/A;  Mid CX 3.50x12 promus DES w/ 0% resdual and Prox OM1 2.50x20 promus DES w/ 20% residual  . CARPAL TUNNEL RELEASE Right Nov 2015  . CARPAL TUNNEL RELEASE Right 1992; 05/2014   [name]; [place]  . CESAREAN SECTION  1982; 1984  . FOOT NEUROMA SURGERY Bilateral 2000  . KNEE ARTHROSCOPY Right ~ 2003   "meniscus repair"  . SHOULDER OPEN ROTATOR CUFF REPAIR Right 1996; 1998   "w/fracture repair"  . THYROID SURGERY  2000   "removed lots of nodules"  . TONSILLECTOMY  1976   Social History   Occupational History  . Food Therapist, nutritional    Social History Main Topics  . Smoking status: Former Smoker    Packs/day: 0.00    Years: 41.00    Types: Cigarettes    Quit date: 03/05/2015  .  Smokeless tobacco: Never Used     Comment: 04/29/2015 "quit smoking cigarettes 02/27/2015"  . Alcohol use No  . Drug use: No  . Sexual activity: Not Currently    Birth control/ protection: Post-menopausal, Surgical        Office Visit Note   Patient: Judith [name]           Date of Birth: 1951/04/12           MRN: [id number] Visit Date: 10/07/2016              Requested by: [name], MD [address] [place], [place] [address] PCP: [name], MD  Chief Complaint  Patient presents with  . Left Leg - Follow-up  . Right Leg - Follow-up      HPI: Patient is status post right transmetatarsal amputation. Has gotten new balance walking shoes with a DUB on the right. Doing well. Looking for employment. Would like a disability rating for right foot.   Assessment & Plan: Visit Diagnoses:  1. S/P transmetatarsal amputation of foot, right ([place])   2. Diabetic peripheral neuropathy ([place])     Plan: follow with [employer] for prosthesis/orthosis needs. Follow up in office as needed.  Follow-Up Instructions: Return if symptoms worsen or fail to improve.   Ortho Exam  Patient is alert, oriented, no adenopathy, well-dressed, normal affect, normal respiratory  effort. Patient is ambulating well, steady gait in DUB on right. Examination she has good dorsiflexion and plantar flexion. The incision is well-healed there is no cellulitis no drainage no signs of infection. Her previous ulcer has healed. There is no hypertrophic callus.  Imaging: No results found.  Labs: Lab Results  Component Value Date   HGBA1C 8.7 09/28/2016   HGBA1C 5.8 03/16/2016   HGBA1C 8.8 01/15/2016   REPTSTATUS 02/14/2015 FINAL 02/12/2015   CULT  02/12/2015    >=100,000 COLONIES/mL GROUP B STREP(S.AGALACTIAE)ISOLATED TESTING AGAINST S. AGALACTIAE NOT ROUTINELY PERFORMED DUE TO PREDICTABILITY OF AMP/PEN/VAN SUSCEPTIBILITY.    LABORGA Multiple bacterial morphotypes present, none 12/06/2014   LABORGA predominant. Suggest appropriate recollection if  12/06/2014   LABORGA clinically indicated. 12/06/2014    Orders:  No orders of the defined types were placed in this encounter.  No orders of the defined types were placed in this encounter.    Procedures: No procedures performed  Clinical Data: No additional findings.  ROS:  All other systems negative, except as noted in the HPI. Review of Systems  Objective: Vital Signs: Ht 5\' 4"  (1.626 m)   Wt 179 lb (81.2 kg)   BMI 30.73 kg/m   Specialty Comments:  No specialty comments available.  PMFS History: Patient Active Problem List   Diagnosis Date Noted  . SBO (small bowel obstruction) ([place]) 05/10/2016  . S/P transmetatarsal amputation of foot, right ([place]) 04/19/2016  . Obstructive sleep apnea 11/26/2015  . Bilateral carpal tunnel syndrome 11/26/2015  . Hypomagnesemia 11/16/2015  . Hyperlipidemia LDL goal <70 10/13/2015  . Abnormality of gait 09/02/2015  . Memory loss 08/12/2015  . Diabetic peripheral neuropathy ([place]) 08/12/2015  . Vitamin D deficiency 08/12/2015  . Hyperthyroidism 04/30/2015  . Heme positive stool   . Persistent atrial fibrillation ([place]) 04/29/2015  . Coronary artery disease with  stable angina pectoris ([place]) 03/29/2015  . Abnormal nuclear stress test   . Acute kidney injury ([place]) 02/12/2015  . History of goiter 09/28/2014  . GERD (gastroesophageal reflux disease) 07/31/2013  . Depression with anxiety 06/01/2013  . DM2 (diabetes mellitus, type 2) (  [place]) 01/30/2013  . HTN (hypertension) 01/30/2013  . Tobacco user 01/30/2013   Past Medical History:  Diagnosis Date  . Abnormal EKG 07/31/2013  . Arthritis    "hands" (03/06/2015)  . Asthma   . Carpal tunnel syndrome, bilateral   . Chronic kidney disease   . Complication of anesthesia    slow to wake up  . Coronary artery disease     2 v CAD with CTO of the RCA and high grade bifurcational LCx/OM stenosis. S/P PCI DES x 2 to the LCx/OM.  Marland Kitchen Diabetic peripheral neuropathy ([place]) "since 1996"  . GERD (gastroesophageal reflux disease)   . Goiter   . Headache    migraines prior to menopause  . History of shingles 06/01/2013  . Hyperlipidemia LDL goal <70 10/13/2015  . Hypertension   . Hyperthyroidism   . PAF (paroxysmal atrial fibrillation) ([place]) 04/29/2015   CHADS2VASC score of 4 now on Apixaban  . Pneumonia ~ 1976  . Tremors of nervous system   . Type II diabetes mellitus (HCC)    insulin dependent    Family History  Problem Relation Age of Onset  . Cancer Mother 54       bronchial cancer  . Breast cancer Mother   . Lung cancer Mother   . Hypertension Father   . COPD Father   . Heart disease Father 60       CAD with cardiac stenting  . Heart attack Father   . Parkinson's disease Father   . Allergies Sister   . Breast cancer Maternal Grandmother   . Emphysema Maternal Grandfather   . Leukemia Paternal Grandmother   . Emphysema Paternal Grandfather   . Thyroid disease Neg Hx     Past Surgical History:  Procedure Laterality Date  . ABDOMINAL HYSTERECTOMY  1988   age 43; CERVICAL DYSPLASIA; ovaries intact.   . AMPUTATION Right 01/23/2016   Procedure: Right 3rd Ray Amputation;  Surgeon: [name],  MD;  Location: [facility];  Service: Orthopedics;  Laterality: Right;  . AMPUTATION Right 02/13/2016   Procedure: Right Transmetatarsal Amputation;  Surgeon: [name], MD;  Location: [facility];  Service: Orthopedics;  Laterality: Right;  . CARDIAC CATHETERIZATION N/A 02/27/2015   Procedure: Left Heart Cath and Coronary Angiography;  Surgeon: [name], MD; LAD 40%, mCFX 80%, OM 70%, RCA 100% calcified       . CARDIAC CATHETERIZATION N/A 03/06/2015   Procedure: Coronary Stent Intervention;  Surgeon: [name], MD;  Location: [facility];  Service: Cardiovascular;  Laterality: N/A;  Mid CX 3.50x12 promus DES w/ 0% resdual and Prox OM1 2.50x20 promus DES w/ 20% residual  . CARPAL TUNNEL RELEASE Right Nov 2015  . CARPAL TUNNEL RELEASE Right 1992; 05/2014   [name];   . CESAREAN SECTION  1982; 1984  . FOOT NEUROMA SURGERY Bilateral 2000  . KNEE ARTHROSCOPY Right ~ 2003   "meniscus repair"  . SHOULDER OPEN ROTATOR CUFF REPAIR Right 1996; 1998   "w/fracture repair"  . THYROID SURGERY  2000   "removed lots of nodules"  . TONSILLECTOMY  1976   Social History   Occupational History  . Food Therapist, nutritional    Social History Main Topics  . Smoking status: Former Smoker    Packs/day: 0.00    Years: 41.00    Types: Cigarettes    Quit date: 03/05/2015  . Smokeless tobacco: Never Used     Comment: 04/29/2015 "quit smoking cigarettes 02/27/2015"  . Alcohol  use No  . Drug use: No  . Sexual activity: Not Currently    Birth control/ protection: Post-menopausal, Surgical

## 2016-10-21 ENCOUNTER — Encounter: Payer: Self-pay | Admitting: Gastroenterology

## 2016-10-21 ENCOUNTER — Telehealth: Payer: Self-pay | Admitting: *Deleted

## 2016-10-21 ENCOUNTER — Ambulatory Visit (INDEPENDENT_AMBULATORY_CARE_PROVIDER_SITE_OTHER): Payer: 59 | Admitting: Gastroenterology

## 2016-10-21 VITALS — BP 162/90 | HR 76 | Ht 63.5 in | Wt 182.1 lb

## 2016-10-21 DIAGNOSIS — K2270 Barrett's esophagus without dysplasia: Secondary | ICD-10-CM

## 2016-10-21 DIAGNOSIS — R195 Other fecal abnormalities: Secondary | ICD-10-CM

## 2016-10-21 DIAGNOSIS — D508 Other iron deficiency anemias: Secondary | ICD-10-CM

## 2016-10-21 DIAGNOSIS — Z86010 Personal history of colonic polyps: Secondary | ICD-10-CM

## 2016-10-21 MED ORDER — ONDANSETRON HCL 4 MG PO TABS: 4.0000 mg | ORAL_TABLET | Freq: Three times a day (TID) | ORAL | 1 refills | Status: DC | PRN

## 2016-10-21 NOTE — Telephone Encounter (Signed)
Ok to hold Apixaban and Plavix prior to endocscopy

## 2016-10-21 NOTE — Telephone Encounter (Signed)
  10/21/2016   RE: Judith [name] DOB: [birth year] MRN: [id number]   Dear  Dr Radford Pax,    We have scheduled the above patient for an endoscopic procedure. Our records show that she is on anticoagulation therapy.   Please advise as to how long the patient may come off her therapy of Eliquis and Plavix prior to the procedure, which is scheduled for 11/05/2016.  Please fax back/ or route the completed form to [facility] at [phone].   Sincerely,    Genella Mech, CMA AAMA

## 2016-10-21 NOTE — Progress Notes (Signed)
Judith [name]    [id number]    Nov 02, 1950  [facility], Renette Butters, MD  Referring Physician: [name], MD [place], [place] [address]  Chief complaint:  FOBT positive anemia   HPI:  66 year old female with history of diabetes, CKD, CAD status post PCI with drug-eluting stent 2016 on Plavix, A. fib on Eliquis. Patient was last seen by Cecille Rubin Hvozdovic for heme positive stool and anemia in January 2017. She was planned to schedule for EGD and colonoscopy at that time but patient had a fall, recurrent admission for rapid A. Fib, amputation of right foot metatarsal. She hasn't followed up until this visit. She also had small bowel obstruction that resolved spontaneously with bowel rest and nothing by mouth, NG tube suction.   Based on Lori's note:  [facility].  colonoscopy on 12/23/2008 there was a small amount of feculent material in the proximal ascending colon. There is moderate pandiverticulosis no endoscopic evidence of diverticulitis. In the mid sigmoid there were 2 small likely hyperplastic polyps in close proximity these were removed with cold biopsy forceps she was advised to have surveillance in 5 years. Patient had an EGD 11/27/2008 in [place] [facility]. She was noted to have short segment Barrett's esophagus and nodular fundal gastritis. Pathology showed squamous mucosa with mild reactive epithelial changes of the type that may be seen with gastroesophageal reflux. Mild active chronic inflammation of cardiac-type gastric mucosa. No intestinal metaplasia is seen. Negative for dysplasia. She was advised to have surveillance in 2 years.  She complains of intermittent dysphagia to mostly solids. Heartburn improved with Protonix.  Denies any nausea or vomiting. No melena or blood per rectum.  She has an new lower leg brace with stable for that and hasn't had a fall since she got the brace.     Outpatient Encounter  Prescriptions as of 10/21/2016  Medication Sig  . albuterol (PROVENTIL HFA) 108 (90 Base) MCG/ACT inhaler Inhale 1-2 puffs into the lungs every 6 (six) hours as needed for wheezing or shortness of breath.  Marland Kitchen amLODipine (NORVASC) 5 MG tablet Take 1 tablet (5 mg total) by mouth daily.  Marland Kitchen apixaban (ELIQUIS) 5 MG TABS tablet Take 1 tablet (5 mg total) by mouth 2 (two) times daily.  Marland Kitchen atorvastatin (LIPITOR) 20 MG tablet Take 1 tablet (20 mg total) by mouth daily.  Marland Kitchen buPROPion (WELLBUTRIN SR) 150 MG 12 hr tablet Take 1 tablet (150 mg total) by mouth 2 (two) times daily.  . clindamycin (CLEOCIN) 300 MG capsule Take 300 mg by mouth See admin instructions. Take 1 capsule (300 mg) one hour prior to dental appointment - last taken 02/05/16  . clopidogrel (PLAVIX) 75 MG tablet TAKE ONE TABLET BY MOUTH ONCE DAILY (Patient taking differently: TAKE 75 MG BY MOUTH ONCE DAILY)  . cyclobenzaprine (FLEXERIL) 5 MG tablet TAKE 1 TABLET BY MOUTH THREE TIMES DAILY AS NEEDED FOR MUSCLE SPASM  . fenofibrate (TRICOR) 145 MG tablet Take 1 tablet (145 mg total) by mouth daily.  Marland Kitchen FLUoxetine (PROZAC) 20 MG capsule Take 1 capsule (20 mg total) by mouth at bedtime.  . fluticasone (FLOVENT HFA) 44 MCG/ACT inhaler Inhale 2 puffs into the lungs 2 (two) times daily as needed (shortness of breath/ wheezing).   . gabapentin (NEURONTIN) 300 MG capsule TAKE 3 CAPSULES BY MOUTH AT morning AND 3 CAPSULES AT BEDTIME (Patient taking differently: Take 900 mg by mouth 2 (two) times daily. TAKE  3 CAPSULES BY MOUTH EVERY  MORNING AND 3 CAPSULES AT BEDTIME)  . insulin degludec (TRESIBA FLEXTOUCH) 100 UNIT/ML SOPN FlexTouch Pen Inject 0.2 mLs (20 Units total) into the skin at bedtime.  . insulin lispro (HUMALOG) 100 UNIT/ML injection 3 times a day (just before each meal) 15-25-10 units (Patient taking differently: Inject into the skin 3 (three) times daily with meals. 3 times a day (just before each meal) 15 units EVERY MORNING -25 units EVERY  AFTERNOON-10 units EVERY EVENING)  . Insulin Pen Needle 32G X 4 MM MISC Use to inject insulin daily  . Insulin Syringe-Needle U-100 (INSULIN SYRINGE .5CC/31GX5/16") 31G X 5/16" 0.5 ML MISC Use to inject insulin  . losartan (COZAAR) 50 MG tablet TAKE ONE TABLET BY MOUTH ONCE DAILY (Patient taking differently: TAKE 50 MG BY MOUTH ONCE DAILY)  . metFORMIN (GLUCOPHAGE-XR) 500 MG 24 hr tablet Take 4 tablets (2,000 mg total) by mouth daily with breakfast. (Patient taking differently: Take 1,000 mg by mouth 2 (two) times daily. )  . methimazole (TAPAZOLE) 10 MG tablet Take 1 tablet (10 mg total) by mouth daily.  . metoprolol succinate (TOPROL-XL) 25 MG 24 hr tablet Take 1 tablet (25 mg total) by mouth 2 (two) times daily. Take with or immediately following a meal.  . nitroGLYCERIN (NITRO-DUR) 0.8 MG/HR Place 1 patch onto the skin daily.  . nitroGLYCERIN (NITROSTAT) 0.4 MG SL tablet Place 1 tablet (0.4 mg total) under the tongue every 5 (five) minutes as needed for chest pain.  Marland Kitchen ondansetron (ZOFRAN) 4 MG tablet Take 1 tablet (4 mg total) by mouth every 8 (eight) hours as needed for nausea or vomiting.  . pantoprazole (PROTONIX) 40 MG tablet Take 1 tablet (40 mg total) by mouth daily.  . silver sulfADIAZINE (SILVADENE) 1 % cream Apply 1 application topically daily.  . sotalol (BETAPACE) 80 MG tablet Take 1 tablet (80 mg total) by mouth every 12 (twelve) hours.  . traMADol (ULTRAM) 50 MG tablet Take 1 tablet (50 mg total) by mouth every 6 (six) hours as needed for moderate pain or severe pain.  Marland Kitchen triamcinolone (KENALOG) 0.1 % paste Use as directed 1 application in the mouth or throat daily as needed.   No facility-administered encounter medications on file as of 10/21/2016.     Allergies as of 10/21/2016 - Review Complete 10/21/2016  Allergen Reaction Noted  . Dilaudid [hydromorphone hcl] Other (See Comments) 05/19/2016  . Novolog [insulin aspart] Shortness Of Breath 02/27/2015  . Codeine Nausea And  Vomiting 09/28/2013  . Iodine Other (See Comments) 01/30/2013  . Penicillins Itching, Rash, and Other (See Comments) 01/30/2013  . Propofol Other (See Comments) 11/26/2015  . Ace inhibitors Cough 03/13/2015  . Demerol [meperidine] Nausea And Vomiting 08/15/2015  . Neosporin [neomycin-bacitracin zn-polymyx] Itching and Rash 01/30/2013  . Percocet [oxycodone-acetaminophen] Rash 08/15/2015  . Tape Itching and Rash 01/30/2013    Past Medical History:  Diagnosis Date  . Abnormal EKG 07/31/2013  . Arthritis    "hands" (03/06/2015)  . Asthma   . Carpal tunnel syndrome, bilateral   . Chronic kidney disease   . Complication of anesthesia    slow to wake up  . Coronary artery disease     2 v CAD with CTO of the RCA and high grade bifurcational LCx/OM stenosis. S/P PCI DES x 2 to the LCx/OM.  Marland Kitchen Diabetic peripheral neuropathy ([facility]) "since 1996"  . GERD (gastroesophageal reflux disease)   . Goiter   . Headache    migraines  prior to menopause  . History of shingles 06/01/2013  . Hyperlipidemia LDL goal <70 10/13/2015  . Hypertension   . Hyperthyroidism   . PAF (paroxysmal atrial fibrillation) ([facility]) 04/29/2015   CHADS2VASC score of 4 now on Apixaban  . Pneumonia ~ 1976  . Tremors of nervous system   . Type II diabetes mellitus ([facility])    insulin dependent    Past Surgical History:  Procedure Laterality Date  . ABDOMINAL HYSTERECTOMY  1988   age 66; CERVICAL DYSPLASIA; ovaries intact.   . AMPUTATION Right 01/23/2016   Procedure: Right 3rd Ray Amputation;  Surgeon: [name], MD;  Location: [facility];  Service: Orthopedics;  Laterality: Right;  . AMPUTATION Right 02/13/2016   Procedure: Right Transmetatarsal Amputation;  Surgeon: [name], MD;  Location: [facility];  Service: Orthopedics;  Laterality: Right;  . CARDIAC CATHETERIZATION N/A 02/27/2015   Procedure: Left Heart Cath and Coronary Angiography;  Surgeon: [name], MD; LAD 40%, mCFX 80%, OM 70%, RCA 100% calcified       .  CARDIAC CATHETERIZATION N/A 03/06/2015   Procedure: Coronary Stent Intervention;  Surgeon: [name], MD;  Location: [facility];  Service: Cardiovascular;  Laterality: N/A;  Mid CX 3.50x12 promus DES w/ 0% resdual and Prox OM1 2.50x20 promus DES w/ 20% residual  . CARPAL TUNNEL RELEASE Right Nov 2015  . CARPAL TUNNEL RELEASE Right 1992; 05/2014   [name]; [place]  . CESAREAN SECTION  1982; 1984  . FOOT NEUROMA SURGERY Bilateral 2000  . KNEE ARTHROSCOPY Right ~ 2003   "meniscus repair"  . SHOULDER OPEN ROTATOR CUFF REPAIR Right 1996; 1998   "w/fracture repair"  . THYROID SURGERY  2000   "removed lots of nodules"  . TONSILLECTOMY  1976    Family History  Problem Relation Age of Onset  . Cancer Mother 20       bronchial cancer  . Breast cancer Mother   . Lung cancer Mother   . Hypertension Father   . COPD Father   . Heart disease Father 74       CAD with cardiac stenting  . Heart attack Father   . Parkinson's disease Father   . Allergies Sister   . Breast cancer Maternal Grandmother   . Emphysema Maternal Grandfather   . Leukemia Paternal Grandmother   . Emphysema Paternal Grandfather   . Thyroid disease Neg Hx     Social History   Social History  . Marital status: Divorced    Spouse name: N/A  . Number of children: 2  . Years of education: Masters   Occupational History  . Food Therapist, nutritional    Social History Main Topics  . Smoking status: Former Smoker    Packs/day: 0.00    Years: 41.00    Types: Cigarettes    Quit date: 03/05/2015  . Smokeless tobacco: Never Used     Comment: 04/29/2015 "quit smoking cigarettes 02/27/2015"  . Alcohol use No  . Drug use: No  . Sexual activity: Not Currently    Birth control/ protection: Post-menopausal, Surgical   Other Topics Concern  . 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      Lives with sister in [place].  Tobacco: 1 ppd x 41 years - quit 2016      Alcohol: none      Drugs: none      Exercise:  Walking in neighborhood; physical job.   Right-handed.   2 cups caffeine daily.         Review of systems: Review of Systems  Constitutional: Negative for fever and chills.  HENT: Positive for sore throat, dry mouth and sinus problem   Eyes: Negative for blurred vision.  Respiratory: Negative for cough, shortness of breath and wheezing.   Cardiovascular: Negative for chest pain and palpitations.  Gastrointestinal: as per HPI Genitourinary: Negative for dysuria, urgency, frequency and hematuria.  Musculoskeletal: Negative for myalgias, back pain and joint pain.  Skin: Negative for itching and rash.  Neurological: Negative for dizziness, tremors, focal weakness, seizures and loss of consciousness.  Endo/Heme/Allergies: Positive for seasonal allergies.  positive for goiter Psychiatric/Behavioral: Negative for depression, suicidal ideas and hallucinations.  All other systems reviewed and are negative.   Physical Exam: Vitals:   10/21/16 0942  BP: (!) 162/90  Pulse: 76   Body mass index is 31.76 kg/m. Gen:      No acute distress HEENT:  EOMI, sclera anicteric Neck:     No masses; no thyromegaly Lungs:    Clear to auscultation bilaterally; normal respiratory effort CV:         irregular rate and rhythm; + murmur Abd:      + bowel sounds; soft, non-tender; no palpable masses, no distension Ext:    No edema; adequate peripheral perfusion, R Leg brace Skin:      Warm and dry; no rash Neuro: alert and oriented x 3 Psych: normal mood and affect  Data Reviewed:  Reviewed labs, radiology imaging, old records and pertinent past GI work up   Assessment and Plan/Recommendations:  66 year old female with history of diabetes, CKD, CAD on  Plavix, A. Fib on Eliquis here for follow-up of chronic anemia, fecal Hemoccult positive. Patient last EGD and colonoscopy in 2010. Colonoscopy was inadequate prep and was recommended surveillance in 5 years EGD showed possible Barrett's, biopsy were negative for intestinal metaplasia but reactive mucosa with chronic inflammation and cardia type. She was recommended follow-up EGD in 2 years  We'll schedule for EGD and colonoscopy for evaluation Discussed with cardiology to hold Plavix 5 days and Eliquis 2 days prior to the procedure. The risks and benefits as well as alternatives of endoscopic procedure(s) have been discussed and reviewed. All questions answered. The patient agrees to proceed.  Given history of chronic constipation and diabetes, patient will need to do a bowel prep We'll send a prescription for Zofran 4 mg every 8 hours as needed to take along with bowel prep  25 minutes was spent face-to-face with the patient. Greater than 50% of the time used for counseling as well as treatment plan and follow-up of anemia. She had multiple questions which were answered to her satisfaction  Damaris Hippo , MD [phone] Mon-Fri 8a-5p [phone] after 5p, weekends, holidays  CC: [name], MD

## 2016-10-21 NOTE — Patient Instructions (Signed)
You have been scheduled for an endoscopy and colonoscopy. Please follow the written instructions given to you at your visit today. Please pick up your prep supplies at the pharmacy within the next 1-3 days. If you use inhalers (even only as needed), please bring them with you on the day of your procedure. Your physician has requested that you go to www.startemmi.com and enter the access code given to you at your visit today. This web site gives a general overview about your procedure. However, you should still follow specific instructions given to you by our office regarding your preparation for the procedure.  You will be contacted by our office prior to your procedure for directions on holding your /Eliquis/Plavix.  If you do not hear from our office 1 week prior to your scheduled procedure, please call [phone] to discuss.   We will send Zofran to your pharmacy  Follow up as needed

## 2016-10-22 ENCOUNTER — Encounter: Payer: Self-pay | Admitting: Gastroenterology

## 2016-10-22 NOTE — Telephone Encounter (Signed)
Please advise patient to hold Plavix 5 days prior to the procedure and Eliquis 2 days prior to the procedure. Thanks

## 2016-10-22 NOTE — Telephone Encounter (Signed)
Dr Silverio Decamp we have ok to hold Eliquis and plavix, How many days each do you want to hold for procedure

## 2016-10-25 NOTE — Telephone Encounter (Signed)
Left message for patient ok to hold plavix and eliquis and how many days before. Told her to contact the office if she

## 2016-10-26 NOTE — Telephone Encounter (Signed)
Did not understand the message that was left for her and would like to speak to the nurse please.

## 2016-10-26 NOTE — Telephone Encounter (Signed)
Patient called back to verify here message about hold her plavix and eliquis

## 2016-11-02 ENCOUNTER — Other Ambulatory Visit: Payer: Self-pay | Admitting: Physician Assistant

## 2016-11-05 ENCOUNTER — Encounter: Payer: Self-pay | Admitting: Gastroenterology

## 2016-11-05 ENCOUNTER — Ambulatory Visit (AMBULATORY_SURGERY_CENTER): Payer: 59 | Admitting: Gastroenterology

## 2016-11-05 VITALS — BP 133/73 | HR 73 | Temp 97.1°F | Resp 14 | Ht 63.0 in | Wt 182.0 lb

## 2016-11-05 DIAGNOSIS — D508 Other iron deficiency anemias: Secondary | ICD-10-CM

## 2016-11-05 DIAGNOSIS — R195 Other fecal abnormalities: Secondary | ICD-10-CM

## 2016-11-05 DIAGNOSIS — B3781 Candidal esophagitis: Secondary | ICD-10-CM

## 2016-11-05 DIAGNOSIS — D122 Benign neoplasm of ascending colon: Secondary | ICD-10-CM

## 2016-11-05 DIAGNOSIS — Z86010 Personal history of colonic polyps: Secondary | ICD-10-CM

## 2016-11-05 MED ORDER — SODIUM CHLORIDE 0.9 % IV SOLN: 500.0000 mL | INTRAVENOUS | Status: DC

## 2016-11-05 MED ORDER — FLUCONAZOLE 100 MG PO TABS: ORAL_TABLET | ORAL | 0 refills | Status: DC

## 2016-11-05 NOTE — Op Note (Signed)
[facility] Patient Name: Judith [name] Procedure Date: 11/05/2016 4:02 PM MRN: [id number] Endoscopist: Mauri Pole , MD Age: 66 Referring MD:  Date of Birth: November 02, 1950 Gender: Female Account #: [id number] Procedure:                Colonoscopy Indications:              Evaluation of unexplained GI bleeding. Fecal heme                            occult positive Medicines:                Monitored Anesthesia Care Procedure:                Pre-Anesthesia Assessment:                           - Prior to the procedure, a History and Physical                            was performed, and patient medications and                            allergies were reviewed. The patient's tolerance of                            previous anesthesia was also reviewed. The risks                            and benefits of the procedure and the sedation                            options and risks were discussed with the patient.                            All questions were answered, and informed consent                            was obtained. Prior Anticoagulants: The patient                            last took Eliquis (apixaban) 2 days and Plavix                            (clopidogrel) 5 days prior to the procedure. ASA                            Grade Assessment: III - A patient with severe                            systemic disease. After reviewing the risks and                            benefits, the patient was deemed in satisfactory  condition to undergo the procedure.                           After obtaining informed consent, the colonoscope                            was passed under direct vision. Throughout the                            procedure, the patient's blood pressure, pulse, and                            oxygen saturations were monitored continuously. The                            Colonoscope was introduced through the anus and                       advanced to the the cecum, identified by                            appendiceal orifice and ileocecal valve. The                            colonoscopy was somewhat difficult due to multiple                            diverticula in the colon and restricted mobility of                            the colon. Successful completion of the procedure                            was aided by applying abdominal pressure. The                            patient tolerated the procedure well. The quality                            of the bowel preparation was good. The ileocecal                            valve, appendiceal orifice, and rectum were                            photographed. Scope In: 4:12:00 PM Scope Out: 4:30:45 PM Scope Withdrawal Time: 0 hours 11 minutes 20 seconds  Total Procedure Duration: 0 hours 18 minutes 45 seconds  Findings:                 The perianal and digital rectal examinations were                            normal.  A 5 mm polyp was found in the ascending colon. The                            polyp was sessile. The polyp was removed with a                            cold snare. Resection and retrieval were complete.                           Multiple small and large-mouthed diverticula were                            found in the sigmoid colon, descending colon,                            transverse colon, ascending colon and cecum. There                            was narrowing of the colon in association with the                            diverticular opening. There was evidence of                            diverticular spasm. There was evidence of an                            impacted diverticulum.                           Non-bleeding internal hemorrhoids were found during                            retroflexion. The hemorrhoids were small. Complications:            No immediate complications. Estimated Blood Loss:      Estimated blood loss was minimal. Impression:               - One 5 mm polyp in the ascending colon, removed                            with a cold snare. Resected and retrieved.                           - Severe diverticulosis in the sigmoid colon, in                            the descending colon, in the transverse colon, in                            the ascending colon and in the cecum. There was                            narrowing of the colon  in association with the                            diverticular opening. There was evidence of                            diverticular spasm. There was evidence of an                            impacted diverticulum.                           - Non-bleeding internal hemorrhoids. Recommendation:           - Patient has a contact number available for                            emergencies. The signs and symptoms of potential                            delayed complications were discussed with the                            patient. Return to normal activities tomorrow.                            Written discharge instructions were provided to the                            patient.                           - Resume previous diet.                           - Continue present medications.                           - Await pathology results.                           - Repeat colonoscopy in 5-10 years for surveillance                            based on pathology results.                           - Return to GI clinic PRN.                           - Resume Eliquis (apixaban) tomorrow and Plavix                            (clopidogrel) today at prior doses. Refer to                            managing physician for further adjustment of  therapy. Mauri Pole, MD 11/05/2016 4:40:41 PM This report has been signed electronically.

## 2016-11-05 NOTE — Op Note (Signed)
[facility] Patient Name: Judith [name] Procedure Date: 11/05/2016 4:02 PM MRN: [id number] Endoscopist: Mauri Pole , MD Age: 66 Referring MD:  Date of Birth: August 24, 1950 Gender: Female Account #: [id number] Procedure:                Upper GI endoscopy Indications:              Gastrointestinal bleeding of unknown origin Medicines:                Monitored Anesthesia Care Procedure:                Pre-Anesthesia Assessment:                           - Prior to the procedure, a History and Physical                            was performed, and patient medications and                            allergies were reviewed. The patient's tolerance of                            previous anesthesia was also reviewed. The risks                            and benefits of the procedure and the sedation                            options and risks were discussed with the patient.                            All questions were answered, and informed consent                            was obtained. Prior Anticoagulants: The patient                            last took Plavix (clopidogrel) 5 days prior to the                            procedure. ASA Grade Assessment: III - A patient                            with severe systemic disease. After reviewing the                            risks and benefits, the patient was deemed in                            satisfactory condition to undergo the procedure.                           After obtaining informed consent, the endoscope was  passed under direct vision. Throughout the                            procedure, the patient's blood pressure, pulse, and                            oxygen saturations were monitored continuously. The                            Endoscope was introduced through the mouth, and                            advanced to the second part of duodenum. The upper                            GI  endoscopy was accomplished without difficulty.                            The patient tolerated the procedure well. Scope In: Scope Out: Findings:                 Patchy candidiasis was found in the entire                            esophagus. Biopsies were taken with a cold forceps                            for histology. No evidence of Barrett's esophagus                           A small hiatal hernia was present.                           The cardia and gastric fundus were normal on                            retroflexion.                           The examined duodenum was normal. Complications:            No immediate complications. Estimated Blood Loss:     Estimated blood loss was minimal. Impression:               - Monilial esophagitis. Biopsied.                           - Small hiatal hernia.                           - Normal examined duodenum. Recommendation:           - Patient has a contact number available for                            emergencies. The signs and symptoms of potential  delayed complications were discussed with the                            patient. Return to normal activities tomorrow.                            Written discharge instructions were provided to the                            patient.                           - Resume previous diet.                           - Continue present medications.                           - Await pathology results.                           - Diflucan (fluconazole) 100 mg PO daily for 10                            days. Mauri Pole, MD 11/05/2016 4:36:01 PM This report has been signed electronically.

## 2016-11-05 NOTE — Progress Notes (Signed)
A/ox3 pleased with MAC, report to Penny RN 

## 2016-11-05 NOTE — Progress Notes (Signed)
Dental advisory given to patient 

## 2016-11-05 NOTE — Patient Instructions (Signed)
Information on polyps,diverticulosis,and hemorrhoids given to you today  Resume Eliquis tomorrow and resume Plavix today at prior doses  Await pathology on biopsies of esophagus and polyps removed in colon    YOU HAD AN ENDOSCOPIC PROCEDURE TODAY AT [facility]:   Refer to the procedure report that was given to you for any specific questions about what was found during the examination.  If the procedure report does not answer your questions, please call your gastroenterologist to clarify.  If you requested that your care partner not be given the details of your procedure findings, then the procedure report has been included in a sealed envelope for you to review at your convenience later.  YOU SHOULD EXPECT: Some feelings of bloating in the abdomen. Passage of more gas than usual.  Walking can help get rid of the air that was put into your GI tract during the procedure and reduce the bloating. If you had a lower endoscopy (such as a colonoscopy or flexible sigmoidoscopy) you may notice spotting of blood in your stool or on the toilet paper. If you underwent a bowel prep for your procedure, you may not have a normal bowel movement for a few days.  Please Note:  You might notice some irritation and congestion in your nose or some drainage.  This is from the oxygen used during your procedure.  There is no need for concern and it should clear up in a day or so.  SYMPTOMS TO REPORT IMMEDIATELY:   Following lower endoscopy (colonoscopy or flexible sigmoidoscopy):  Excessive amounts of blood in the stool  Significant tenderness or worsening of abdominal pains  Swelling of the abdomen that is new, acute  Fever of 100F or higher   Following upper endoscopy (EGD)  Vomiting of blood or coffee ground material  New chest pain or pain under the shoulder blades  Painful or persistently difficult swallowing  New shortness of breath  Fever of 100F or higher  Black, tarry-looking  stools  For urgent or emergent issues, a gastroenterologist can be reached at any hour by calling [phone].   DIET:  We do recommend a small meal at first, but then you may proceed to your regular diet.  Drink plenty of fluids but you should avoid alcoholic beverages for 24 hours.  ACTIVITY:  You should plan to take it easy for the rest of today and you should NOT DRIVE or use heavy machinery until tomorrow (because of the sedation medicines used during the test).    FOLLOW UP: Our staff will call the number listed on your records the next business day following your procedure to check on you and address any questions or concerns that you may have regarding the information given to you following your procedure. If we do not reach you, we will leave a message.  However, if you are feeling well and you are not experiencing any problems, there is no need to return our call.  We will assume that you have returned to your regular daily activities without incident.  If any biopsies were taken you will be contacted by phone or by letter within the next 1-3 weeks.  Please call us at [phone] if you have not heard about the biopsies in 3 weeks.    SIGNATURES/CONFIDENTIALITY: You and/or your care partner have signed paperwork which will be entered into your electronic medical record.  These signatures attest to the fact that that the information above on your After Visit  Summary has been reviewed and is understood.  Full responsibility of the confidentiality of this discharge information lies with you and/or your care-partner. 

## 2016-11-05 NOTE — Progress Notes (Signed)
Called to room to assist during endoscopic procedure.  Patient ID and intended procedure confirmed with present staff. Received instructions for my participation in the procedure from the performing physician.  

## 2016-11-08 ENCOUNTER — Telehealth: Payer: Self-pay | Admitting: *Deleted

## 2016-11-08 NOTE — Telephone Encounter (Signed)
Mailbox full therefore unable to leave message on f/u callback

## 2016-11-08 NOTE — Telephone Encounter (Signed)
No answer, message left for the patient. 

## 2016-11-16 ENCOUNTER — Encounter: Payer: Self-pay | Admitting: Gastroenterology

## 2016-12-14 ENCOUNTER — Other Ambulatory Visit: Payer: Self-pay | Admitting: Physician Assistant

## 2016-12-14 ENCOUNTER — Other Ambulatory Visit: Payer: Self-pay | Admitting: Cardiology

## 2016-12-14 DIAGNOSIS — I2510 Atherosclerotic heart disease of native coronary artery without angina pectoris: Secondary | ICD-10-CM

## 2016-12-29 ENCOUNTER — Encounter: Payer: Self-pay | Admitting: Physician Assistant

## 2016-12-29 ENCOUNTER — Other Ambulatory Visit: Payer: Self-pay | Admitting: Family Medicine

## 2016-12-29 ENCOUNTER — Ambulatory Visit (INDEPENDENT_AMBULATORY_CARE_PROVIDER_SITE_OTHER): Payer: BLUE CROSS/BLUE SHIELD | Admitting: Physician Assistant

## 2016-12-29 VITALS — BP 136/82 | HR 77 | Temp 98.8°F | Resp 18 | Ht 63.0 in | Wt 181.4 lb

## 2016-12-29 DIAGNOSIS — E781 Pure hyperglyceridemia: Secondary | ICD-10-CM | POA: Diagnosis not present

## 2016-12-29 DIAGNOSIS — G8929 Other chronic pain: Secondary | ICD-10-CM | POA: Diagnosis not present

## 2016-12-29 DIAGNOSIS — Z760 Encounter for issue of repeat prescription: Secondary | ICD-10-CM | POA: Diagnosis not present

## 2016-12-29 DIAGNOSIS — Z13220 Encounter for screening for lipoid disorders: Secondary | ICD-10-CM

## 2016-12-29 DIAGNOSIS — M549 Dorsalgia, unspecified: Secondary | ICD-10-CM | POA: Diagnosis not present

## 2016-12-29 MED ORDER — GABAPENTIN 300 MG PO CAPS: 900.0000 mg | ORAL_CAPSULE | Freq: Two times a day (BID) | ORAL | 3 refills | Status: DC

## 2016-12-29 MED ORDER — PANTOPRAZOLE SODIUM 40 MG PO TBEC: 40.0000 mg | DELAYED_RELEASE_TABLET | Freq: Every day | ORAL | 3 refills | Status: DC

## 2016-12-29 NOTE — Progress Notes (Signed)
12/29/2016 6:22 PM   DOB: 05-13-1950 / MRN: [id number]  SUBJECTIVE:  Judith [name] is a 66 y.o. female presenting for back pain.  She has a history of chronic back pain.  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 Saw an orthopedic surgeon regarding this and it was felt that nothing could be done.  She would like to try PT and also wants to try pain medicine.   She sees Dr. Loanne [name] for diabetes and hyperthyroidism. She sees cards for a history of CAD with stent placement in 2016.  She is compliant with her medications and follow up appointments.   She has history of GERD well managed with pantoprazole 40 daily and denies dysphagia, odynophagia, unexplained weight loss.   She is allergic to dilaudid [hydromorphone hcl]; novolog [insulin aspart]; codeine; iodine; penicillins; propofol; ace inhibitors; demerol [meperidine]; neosporin [neomycin-bacitracin zn-polymyx]; percocet [oxycodone-acetaminophen]; and tape.   She  has a past medical history of Abnormal EKG (07/31/2013); Arthritis; Asthma; Carpal tunnel syndrome, bilateral; Chronic kidney disease; Complication of anesthesia; Coronary artery disease; Diabetic peripheral neuropathy (HCC) ("since 1996"); GERD (gastroesophageal reflux disease); Goiter; Headache; History of shingles (06/01/2013); Hyperlipidemia LDL goal <70 (10/13/2015); Hypertension; Hyperthyroidism; PAF (paroxysmal atrial fibrillation) ([place]) (04/29/2015); Pneumonia (~ 1976); Tremors of nervous system; and Type II diabetes mellitus ([place]).    She  reports that she quit smoking about 21 months ago. Her smoking use included Cigarettes. She smoked 0.00 packs per day for 41.00 years. She has never used smokeless tobacco. She reports that she does not drink alcohol or use drugs. She  reports that she does not currently engage in sexual activity. She reports using the following methods of birth control/protection: Post-menopausal and Surgical. 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  Her family history includes Allergies in her sister; Breast cancer in her maternal grandmother and mother; COPD in her father; Cancer (age of onset: 61) in her mother; Emphysema in her maternal grandfather and paternal grandfather; Heart attack in her father; Heart disease (age of onset: 48) in her father; Hypertension in her father; Leukemia in her paternal grandmother; Lung cancer in her mother; Parkinson's disease in her father.  Review of Systems  Constitutional: Negative for chills and fever.  Genitourinary: Negative for dysuria, frequency and urgency.  Musculoskeletal: Negative for myalgias.  Skin: Negative for itching and rash.    The problem list and medications were reviewed and updated by myself where necessary and exist elsewhere in the encounter.   OBJECTIVE: BP 136/82   Pulse 77   Temp 98.8 F (37.1 C) (Oral)   Resp 18   Ht 5\' 3"  (1.6 m)   Wt 181 lb 6.4 oz (82.3 kg)   SpO2 94%   BMI 32.13 kg/m   Lab Results  Component Value Date   HGBA1C 8.7 09/28/2016   Lab Results  Component Value Date   CREATININE 0.66 09/24/2016   BUN 23 09/24/2016   NA 138 09/24/2016   K 4.4 09/24/2016   CL 96 09/24/2016   CO2 25 09/24/2016   Lab Results  Component Value Date   WBC 10.1 08/06/2016  HGB 11.7 (L) 08/06/2016   HCT 36.4 08/06/2016   MCV 87.1 08/06/2016   PLT 374 08/06/2016   Lab Results  Component Value Date   CHOL 249 (H) 09/24/2016   HDL 30 (L) 09/24/2016   LDLCALC  Comment 09/24/2016   TRIG 841 (HH) 09/24/2016   CHOLHDL 8.3 (H) 09/24/2016     Physical Exam  Constitutional: She is oriented to person, place, and time. She is active.  Non-toxic appearance.  Eyes: Pupils are equal, round, and reactive to light. EOM are normal.  Cardiovascular: Normal rate, regular rhythm, S1 normal, S2 normal, normal heart sounds and intact distal pulses.  Exam reveals no gallop, no friction rub and no decreased pulses.   No murmur heard. Pulmonary/Chest: Effort normal. No stridor. No tachypnea. No respiratory distress. She has no wheezes. She has no rales.  Abdominal: Soft. Normal appearance and bowel sounds are normal. She exhibits no distension and no mass. There is no tenderness. There is no rigidity, no rebound, no guarding and no CVA tenderness.  Musculoskeletal: She exhibits no edema.  Neurological: She is alert and oriented to person, place, and time. She has normal strength and normal reflexes. She is not disoriented. She displays no atrophy. No cranial nerve deficit or sensory deficit. She exhibits normal muscle tone. Coordination and gait normal.  Skin: Skin is warm and dry. She is not diaphoretic. No pallor.  Psychiatric: Her behavior is normal.    No results found for this or any previous visit (from the past 72 hour(s)).  No results found.  ASSESSMENT AND PLAN:  Judith [name] was seen today for back pain.  Diagnoses and all orders for this visit:  Chronic back pain, unspecified back location, unspecified back pain laterality: I'd like to try and get her in with Dr. Naaman [name] in physical medicine. Will go ahead and start PT. Will see her back in about there months for a recheck.  -     Ambulatory referral to Physical Medicine Rehab -     Ambulatory referral to Physical Therapy -     gabapentin (NEURONTIN) 300 MG capsule; Take 3 capsules (900 mg total) by mouth 2 (two) times daily. TAKE 3 CAPSULES BY MOUTH EVERY  MORNING AND 3 CAPSULES AT  BEDTIME  Hypertriglyceridemia -     Lipid panel  GERD: -     pantoprazole (PROTONIX) 40 MG tablet; Take 1 tablet (40 mg total) by mouth daily.  Screening for lipid disorders -     Lipid panel      The patient is advised to call or return to clinic if she does not see an improvement in symptoms, or to seek the care of the closest emergency department if she worsens with the above plan.   Philis Fendt, MHS, PA-C [facility] at [place] 12/29/2016 6:22 PM

## 2016-12-29 NOTE — Patient Instructions (Signed)
     IF you received an x-ray today, you will receive an invoice from [facility]. Please contact [facility] at [phone] with questions or concerns regarding your invoice.   IF you received labwork today, you will receive an invoice from [facility]. Please contact [facility] at [phone] with questions or concerns regarding your invoice.   Our billing staff will not be able to assist you with questions regarding bills from these companies.  You will be contacted with the lab results as soon as they are available. The fastest way to get your results is to activate your My Chart account. Instructions are located on the last page of this paperwork. If you have not heard from us regarding the results in 2 weeks, please contact this office.     

## 2017-01-06 ENCOUNTER — Other Ambulatory Visit: Payer: BLUE CROSS/BLUE SHIELD | Admitting: *Deleted

## 2017-01-06 DIAGNOSIS — E785 Hyperlipidemia, unspecified: Secondary | ICD-10-CM

## 2017-01-06 LAB — LIPID PANEL
CHOL/HDL RATIO: 7.7 ratio — AB (ref 0.0–4.4)
Cholesterol, Total: 223 mg/dL — ABNORMAL HIGH (ref 100–199)
HDL: 29 mg/dL — AB (ref >39)
Triglycerides: 704 mg/dL (ref 0–149)

## 2017-01-06 LAB — LDL CHOLESTEROL, DIRECT: LDL DIRECT: 97 mg/dL (ref 0–99)

## 2017-01-07 ENCOUNTER — Other Ambulatory Visit: Payer: 59

## 2017-01-13 ENCOUNTER — Telehealth (INDEPENDENT_AMBULATORY_CARE_PROVIDER_SITE_OTHER): Payer: Self-pay | Admitting: Family

## 2017-01-13 ENCOUNTER — Encounter: Payer: Self-pay | Admitting: Physician Assistant

## 2017-01-13 ENCOUNTER — Ambulatory Visit (INDEPENDENT_AMBULATORY_CARE_PROVIDER_SITE_OTHER): Payer: BLUE CROSS/BLUE SHIELD | Admitting: Physician Assistant

## 2017-01-13 VITALS — BP 132/89 | HR 82 | Resp 16 | Ht 63.0 in | Wt 184.0 lb

## 2017-01-13 DIAGNOSIS — M546 Pain in thoracic spine: Secondary | ICD-10-CM | POA: Diagnosis not present

## 2017-01-13 DIAGNOSIS — Z8639 Personal history of other endocrine, nutritional and metabolic disease: Secondary | ICD-10-CM

## 2017-01-13 LAB — GLUCOSE, POCT (MANUAL RESULT ENTRY): POC Glucose: 150 mg/dl — AB (ref 70–99)

## 2017-01-13 MED ORDER — PREDNISONE 10 MG PO TABS: 30.0000 mg | ORAL_TABLET | Freq: Every day | ORAL | 0 refills | Status: DC
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
## 2017-01-13 NOTE — Patient Instructions (Addendum)
Will try you on some prednisone but please take short acting 5-10, 15-20, 10-15 to augment you control while taking pred. Take one tramadol 1 tab in the morning and afternoon, then two at night. Call me if you sugar goes over 300.

## 2017-01-13 NOTE — Telephone Encounter (Signed)
Returned call to patient left message to call back to schedule appointment with Dr Sharol Given or Junie Panning  [phone]

## 2017-01-14 ENCOUNTER — Other Ambulatory Visit: Payer: Self-pay | Admitting: Physician Assistant

## 2017-01-18 ENCOUNTER — Encounter: Payer: Self-pay | Admitting: Physician Assistant

## 2017-01-18 ENCOUNTER — Encounter: Payer: Self-pay | Admitting: Endocrinology

## 2017-01-18 ENCOUNTER — Other Ambulatory Visit: Payer: Self-pay | Admitting: Endocrinology

## 2017-01-18 MED ORDER — INSULIN LISPRO 100 UNIT/ML ~~LOC~~ SOLN: SUBCUTANEOUS | 11 refills | Status: DC

## 2017-01-19 ENCOUNTER — Ambulatory Visit (INDEPENDENT_AMBULATORY_CARE_PROVIDER_SITE_OTHER): Payer: BLUE CROSS/BLUE SHIELD | Admitting: Family

## 2017-01-19 ENCOUNTER — Encounter (INDEPENDENT_AMBULATORY_CARE_PROVIDER_SITE_OTHER): Payer: Self-pay | Admitting: Family

## 2017-01-19 DIAGNOSIS — B351 Tinea unguium: Secondary | ICD-10-CM

## 2017-01-19 DIAGNOSIS — L97521 Non-pressure chronic ulcer of other part of left foot limited to breakdown of skin: Secondary | ICD-10-CM | POA: Diagnosis not present

## 2017-01-19 DIAGNOSIS — E1142 Type 2 diabetes mellitus with diabetic polyneuropathy: Secondary | ICD-10-CM | POA: Diagnosis not present

## 2017-01-19 NOTE — Progress Notes (Deleted)
Patient ID: Judith [name]                 DOB: 04-21-1951                    MRN: [id number]     HPI: Judith [name] is a 66 y.o. female patient referred to lipid clinic by Dr. Radford Pax. 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  Current Medications:  Atorvastatin 20 mg daily Fenofibrate 145 mg daily   Intolerances:  No statin intolerances noted  Risk Factors: ASCVD, HTN, HLD, T2DM  LDL goal: < 70 mg/dL  Diet: ***  Exercise: ***  Family History: The patient's family history includes Allergies in her sister; Breast cancer in her maternal grandmother and mother; COPD in her father; Cancer (age of onset: 20) in her mother; Emphysema in her maternal grandfather and paternal grandfather; Heart attack in her father; Heart disease (age of onset: 102) in her father; Hypertension in her father; Leukemia in her paternal grandmother; Lung cancer in her mother; Parkinson's disease in her father.   Social History: Divorced with 2 kids, former cigarette smoker for 41 years. No alcohol or drug use.   Labs: 01/06/17: Direct LDL: 97, TG: 704, HDL: 29, LDL: too high to calc  Past Medical History:  Diagnosis Date  . Abnormal EKG 07/31/2013  . Arthritis    "hands" (03/06/2015)  . Asthma   . Carpal tunnel syndrome, bilateral   . Chronic kidney disease   . Complication of anesthesia    slow to wake up  . Coronary artery disease     2 v CAD with CTO of the RCA and high grade bifurcational LCx/OM stenosis. S/P PCI DES x 2 to the LCx/OM.  Marland Kitchen Diabetic peripheral neuropathy ([place]) "since 1996"  . GERD (gastroesophageal reflux disease)   . Goiter   . Headache    migraines prior to menopause  . History of shingles 06/01/2013  . Hyperlipidemia LDL goal <70 10/13/2015  . Hypertension   .  Hyperthyroidism   . PAF (paroxysmal atrial fibrillation) ([place]) 04/29/2015   CHADS2VASC score of 4 now on Apixaban  . Pneumonia ~ 1976  . Tremors of nervous system   . Type II diabetes mellitus (HCC)    insulin dependent    Current Outpatient Prescriptions on File Prior to Visit  Medication Sig Dispense Refill  . albuterol (PROVENTIL HFA) 108 (90 Base) MCG/ACT inhaler Inhale 1-2 puffs into the lungs every 6 (six) hours as needed for wheezing or shortness of breath. 1 Inhaler 0  . amLODipine (NORVASC) 5 MG tablet Take 1 tablet (5 mg total) by mouth daily. 30 tablet 11  . apixaban (ELIQUIS) 5 MG TABS tablet Take 1 tablet (5 mg total) by mouth 2 (two) times daily. 60 tablet 11  . atorvastatin (LIPITOR) 20 MG tablet Take 1 tablet (20 mg total) by mouth daily. 90 tablet 1  . buPROPion (WELLBUTRIN SR) 150 MG 12 hr tablet Take 1 tablet (150 mg total) by mouth 2 (two) times daily. 180 tablet 3  . clopidogrel (PLAVIX) 75 MG tablet TAKE ONE TABLET BY MOUTH ONCE DAILY (Patient taking differently: TAKE 75 MG BY MOUTH ONCE DAILY) 90 tablet 3  . cyclobenzaprine (FLEXERIL) 5 MG tablet TAKE 1 TABLET BY MOUTH THREE TIMES  DAILY AS NEEDED FOR MUSCLE SPASM 30 tablet 0  . fenofibrate (TRICOR) 145 MG tablet Take 1 tablet (145 mg total) by mouth daily. 90 tablet 3  . fluconazole (DIFLUCAN) 100 MG tablet Take Diflucan 100 mg PO daily for 10 days 10 tablet 0  . FLUoxetine (PROZAC) 20 MG capsule Take 1 capsule (20 mg total) by mouth at bedtime. 90 capsule 3  . fluticasone (FLOVENT HFA) 44 MCG/ACT inhaler Inhale 2 puffs into the lungs 2 (two) times daily as needed (shortness of breath/ wheezing).     . gabapentin (NEURONTIN) 300 MG capsule Take 3 capsules (900 mg total) by mouth 2 (two) times daily. TAKE 3 CAPSULES BY MOUTH EVERY  MORNING AND 3 CAPSULES AT BEDTIME 540 capsule 3  . insulin degludec (TRESIBA FLEXTOUCH) 100 UNIT/ML SOPN FlexTouch Pen Inject 0.2 mLs (20 Units total) into the skin at bedtime. 5 pen 11  .  insulin lispro (HUMALOG) 100 UNIT/ML injection 3 times a day (just before each meal) 15-25-10 units 10 mL 11  . Insulin Pen Needle 32G X 4 MM MISC Use to inject insulin daily 100 each 5  . Insulin Syringe-Needle U-100 (INSULIN SYRINGE .5CC/31GX5/16") 31G X 5/16" 0.5 ML MISC Use to inject insulin 100 each 5  . losartan (COZAAR) 50 MG tablet TAKE ONE TABLET BY MOUTH ONCE DAILY (Patient taking differently: TAKE 50 MG BY MOUTH ONCE DAILY) 90 tablet 3  . metFORMIN (GLUCOPHAGE-XR) 500 MG 24 hr tablet Take 4 tablets (2,000 mg total) by mouth daily with breakfast. (Patient taking differently: Take 1,000 mg by mouth 2 (two) times daily. ) 360 tablet 3  . methimazole (TAPAZOLE) 10 MG tablet Take 1 tablet (10 mg total) by mouth daily. 30 tablet 11  . metoprolol succinate (TOPROL-XL) 25 MG 24 hr tablet TAKE ONE TABLET BY MOUTH TWICE DAILY 180 tablet 2  . nitroGLYCERIN (NITRO-DUR) 0.8 MG/HR Place 1 patch onto the skin daily.    . nitroGLYCERIN (NITROSTAT) 0.4 MG SL tablet Place 1 tablet (0.4 mg total) under the tongue every 5 (five) minutes as needed for chest pain. 25 tablet 3  . ondansetron (ZOFRAN) 4 MG tablet Take 1 tablet (4 mg total) by mouth every 8 (eight) hours as needed for nausea or vomiting. 90 tablet 1  . pantoprazole (PROTONIX) 40 MG tablet Take 1 tablet (40 mg total) by mouth daily. 90 tablet 3  . predniSONE (DELTASONE) 10 MG tablet Take 3 tablets (30 mg total) by mouth daily with breakfast. 15 tablet 0  . silver sulfADIAZINE (SILVADENE) 1 % cream Apply 1 application topically daily. 400 g 0  . sotalol (BETAPACE) 80 MG tablet TAKE ONE TABLET BY MOUTH EVERY 12 HOURS 180 tablet 2  . traMADol (ULTRAM) 50 MG tablet Take 1 tablet (50 mg total) by mouth every 6 (six) hours as needed for moderate pain or severe pain. 360 tablet 1   No current facility-administered medications on file prior to visit.     Allergies  Allergen Reactions  . Dilaudid [Hydromorphone Hcl] Other (See Comments)    HEADACHE    . Novolog [Insulin Aspart] Shortness Of Breath    "breathing problems"  . Codeine Nausea And Vomiting    HIGH DOSES-SEVERE VOMITING  . Iodine Other (See Comments)    MUST HAVE BENADRYL PRIOR TO PROCEDURE AND RIGHT BEFORE TREATMENT TO COUNTERACT REACTION-BLISTERING REACTION DERMATOLOGICAL  . Penicillins Itching, Rash and Other (See Comments)    Has patient had a PCN reaction causing immediate rash, facial/tongue/throat swelling, SOB or  lightheadedness with hypotension: Yes Has patient had a PCN reaction causing severe rash involving mucus membranes or skin necrosis: No Has patient had a PCN reaction that required hospitalization No Has patient had a PCN reaction occurring within the last 10 years: No If all of the above answers are "NO", then may proceed with Cephalosporin use.  CHEST SIZED RASH AND ITCHING   . Propofol Other (See Comments)    "Breathing problems - asthma attack"  . Ace Inhibitors Cough  . Demerol [Meperidine] Nausea And Vomiting  . Neosporin [Neomycin-Bacitracin Zn-Polymyx] Itching and Rash    MAKES REACTIONS WORSE WHEN USING AS PROPHYLACTIC  . Percocet [Oxycodone-Acetaminophen] Rash  . Tape Itching and Rash    Assessment/Plan: Hyperlipidemia: Lipid panel and LDL are above goal of <70, at 97.   Optimize statin***

## 2017-01-19 NOTE — Progress Notes (Signed)
Office Visit Note   Patient: Judith [name]           Date of Birth: 1951/04/18           MRN: [id number] Visit Date: 01/19/2017              Requested by: [name], PA-C [place], [place] [address] PCP: [name], PA-C  Chief Complaint  Patient presents with  . Left Foot - Pain  . Right Foot - Pain      HPI: The patient is a 66 year old woman who presents a complaining of a ulcer to her left great toe. She states that due to her neuropathy she is unable to feel she had an ingrown toenail has trimmed her toenails since but does have some skin breakdown to the distal toe. No drainage.  Assessment & Plan: Visit Diagnoses:  1. Skin ulcer of left great toe, limited to breakdown of skin ([place])   2. Diabetic peripheral neuropathy ([place])   3. Onychomycosis     Plan: Daily mupirocin dressings following wound cleansing. She'll follow-up in office as needed.  Follow-Up Instructions: Return if symptoms worsen or fail to improve.   Ortho Exam  Patient is alert, oriented, no adenopathy, well-dressed, normal affect, normal respiratory effort. On examination of left foot has thickened and discolored onychomycotic nails. The left great toe medial border had caused a fissure. This is 7 mm x 2 mm and 1 mm deep. No drainage no surrounding erythema or odor. No sign of infection.  Imaging: No results found. No images are attached to the encounter.  Labs: Lab Results  Component Value Date   HGBA1C 8.7 09/28/2016   HGBA1C 5.8 03/16/2016   HGBA1C 8.8 01/15/2016   REPTSTATUS 02/14/2015 FINAL 02/12/2015   CULT  02/12/2015    >=100,000 COLONIES/mL GROUP B STREP(S.AGALACTIAE)ISOLATED TESTING AGAINST S. AGALACTIAE NOT ROUTINELY PERFORMED DUE TO PREDICTABILITY OF AMP/PEN/VAN SUSCEPTIBILITY.    LABORGA Multiple bacterial morphotypes present, none 12/06/2014   LABORGA predominant. Suggest appropriate recollection if  12/06/2014   LABORGA clinically indicated. 12/06/2014     Orders:  No orders of the defined types were placed in this encounter.  No orders of the defined types were placed in this encounter.    Procedures: No procedures performed  Clinical Data: No additional findings.  ROS:  All other systems negative, except as noted in the HPI. Review of Systems  Constitutional: Negative for chills and fever.  Skin: Positive for wound. Negative for color change.    Objective: Vital Signs: There were no vitals taken for this visit.  Specialty Comments:  No specialty comments available.  PMFS History: Patient Active Problem List   Diagnosis Date Noted  . SBO (small bowel obstruction) ([place]) 05/10/2016  . S/P transmetatarsal amputation of foot, right ([place]) 04/19/2016  . Obstructive sleep apnea 11/26/2015  . Bilateral carpal tunnel syndrome 11/26/2015  . Hypomagnesemia 11/16/2015  . Hyperlipidemia LDL goal <70 10/13/2015  . Abnormality of gait 09/02/2015  . Memory loss 08/12/2015  . Diabetic peripheral neuropathy ([place]) 08/12/2015  . Vitamin D deficiency 08/12/2015  . Hyperthyroidism 04/30/2015  . Heme positive stool   . Persistent atrial fibrillation ([place]) 04/29/2015  . Coronary artery disease with stable angina pectoris ([place]) 03/29/2015  . Abnormal nuclear stress test   . Acute kidney injury ([place]) 02/12/2015  . History of goiter 09/28/2014  . GERD (gastroesophageal reflux disease) 07/31/2013  . Depression with anxiety 06/01/2013  . DM2 (diabetes mellitus, type 2) ([place])  01/30/2013  . HTN (hypertension) 01/30/2013  . Tobacco user 01/30/2013   Past Medical History:  Diagnosis Date  . Abnormal EKG 07/31/2013  . Arthritis    "hands" (03/06/2015)  . Asthma   . Carpal tunnel syndrome, bilateral   . Chronic kidney disease   . Complication of anesthesia    slow to wake up  . Coronary artery disease     2 v CAD with CTO of the RCA and high grade bifurcational LCx/OM stenosis. S/P PCI DES x 2 to the LCx/OM.  Marland Kitchen Diabetic peripheral  neuropathy ([place]) "since 1996"  . GERD (gastroesophageal reflux disease)   . Goiter   . Headache    migraines prior to menopause  . History of shingles 06/01/2013  . Hyperlipidemia LDL goal <70 10/13/2015  . Hypertension   . Hyperthyroidism   . PAF (paroxysmal atrial fibrillation) ([place]) 04/29/2015   CHADS2VASC score of 4 now on Apixaban  . Pneumonia ~ 1976  . Tremors of nervous system   . Type II diabetes mellitus (HCC)    insulin dependent    Family History  Problem Relation Age of Onset  . Cancer Mother 56       bronchial cancer  . Breast cancer Mother   . Lung cancer Mother   . Hypertension Father   . COPD Father   . Heart disease Father 85       CAD with cardiac stenting  . Heart attack Father   . Parkinson's disease Father   . Allergies Sister   . Breast cancer Maternal Grandmother   . Emphysema Maternal Grandfather   . Leukemia Paternal Grandmother   . Emphysema Paternal Grandfather   . Thyroid disease Neg Hx     Past Surgical History:  Procedure Laterality Date  . ABDOMINAL HYSTERECTOMY  1988   age 35; CERVICAL DYSPLASIA; ovaries intact.   . AMPUTATION Right 01/23/2016   Procedure: Right 3rd Ray Amputation;  Surgeon: [name], MD;  Location: [facility];  Service: Orthopedics;  Laterality: Right;  . AMPUTATION Right 02/13/2016   Procedure: Right Transmetatarsal Amputation;  Surgeon: [name], MD;  Location: [facility];  Service: Orthopedics;  Laterality: Right;  . CARDIAC CATHETERIZATION N/A 02/27/2015   Procedure: Left Heart Cath and Coronary Angiography;  Surgeon: [name], MD; LAD 40%, mCFX 80%, OM 70%, RCA 100% calcified       . CARDIAC CATHETERIZATION N/A 03/06/2015   Procedure: Coronary Stent Intervention;  Surgeon: [name], MD;  Location: [facility];  Service: Cardiovascular;  Laterality: N/A;  Mid CX 3.50x12 promus DES w/ 0% resdual and Prox OM1 2.50x20 promus DES w/ 20% residual  . CARPAL TUNNEL RELEASE Right Nov 2015  . CARPAL TUNNEL  RELEASE Right 1992; 05/2014   [name]; [place]  . CESAREAN SECTION  1982; 1984  . FOOT NEUROMA SURGERY Bilateral 2000  . KNEE ARTHROSCOPY Right ~ 2003   "meniscus repair"  . SHOULDER OPEN ROTATOR CUFF REPAIR Right 1996; 1998   "w/fracture repair"  . THYROID SURGERY  2000   "removed lots of nodules"  . TONSILLECTOMY  1976   Social History   Occupational History  . Food Therapist, nutritional    Social History Main Topics  . Smoking status: Former Smoker    Packs/day: 0.00    Years: 41.00    Types: Cigarettes    Quit date: 03/05/2015  . Smokeless tobacco: Never Used     Comment: 04/29/2015 "quit smoking cigarettes 02/27/2015"  . Alcohol  use No  . Drug use: No  . Sexual activity: Not Currently    Birth control/ protection: Post-menopausal, Surgical

## 2017-01-20 ENCOUNTER — Ambulatory Visit: Payer: BLUE CROSS/BLUE SHIELD | Admitting: Pharmacist

## 2017-01-20 ENCOUNTER — Other Ambulatory Visit: Payer: Self-pay

## 2017-01-20 MED ORDER — INSULIN REGULAR HUMAN 100 UNIT/ML IJ SOLN: INTRAMUSCULAR | 11 refills | Status: DC

## 2017-01-21 ENCOUNTER — Telehealth: Payer: Self-pay | Admitting: Endocrinology

## 2017-01-21 ENCOUNTER — Telehealth: Payer: Self-pay

## 2017-01-21 ENCOUNTER — Other Ambulatory Visit: Payer: Self-pay

## 2017-01-21 MED ORDER — INSULIN REGULAR HUMAN 100 UNIT/ML IJ SOLN: INTRAMUSCULAR | 11 refills | Status: DC

## 2017-01-21 NOTE — Telephone Encounter (Signed)
[facility] calling to get clarification on dosage instructions for insulin regular (NOVOLIN R RELION) 100 units/mL injection. Please call pharmacy and advise.

## 2017-01-21 NOTE — Telephone Encounter (Signed)
PA initiated for Humalog done through [email or web address]

## 2017-01-21 NOTE — Telephone Encounter (Signed)
[employer] calling in reference to needing PA for insulin lispro (HUMALOG) 100 UNIT/ML injection. Ref number [id number]. Please call Cover My Meds and advise.

## 2017-01-21 NOTE — Telephone Encounter (Signed)
Directions are correct: 15 units with breakfast, 25 with lunch, and 10 with supper.

## 2017-01-24 ENCOUNTER — Telehealth: Payer: Self-pay

## 2017-01-24 NOTE — Telephone Encounter (Signed)
Prior Authorization sent 01/21/17. Humalog was denied 01/24/17.

## 2017-01-24 NOTE — Telephone Encounter (Signed)
Called pharmacy to clarify.

## 2017-01-24 NOTE — Telephone Encounter (Signed)
No, just 1 or the other

## 2017-01-24 NOTE — Telephone Encounter (Signed)
Patient is now taking the Relion R.

## 2017-01-31 ENCOUNTER — Encounter: Payer: Self-pay | Admitting: Endocrinology

## 2017-01-31 ENCOUNTER — Ambulatory Visit (INDEPENDENT_AMBULATORY_CARE_PROVIDER_SITE_OTHER): Payer: BLUE CROSS/BLUE SHIELD | Admitting: Endocrinology

## 2017-01-31 VITALS — BP 130/84 | HR 73 | Wt 180.4 lb

## 2017-01-31 DIAGNOSIS — Z794 Long term (current) use of insulin: Secondary | ICD-10-CM | POA: Diagnosis not present

## 2017-01-31 DIAGNOSIS — E0590 Thyrotoxicosis, unspecified without thyrotoxic crisis or storm: Secondary | ICD-10-CM | POA: Diagnosis not present

## 2017-01-31 DIAGNOSIS — E1101 Type 2 diabetes mellitus with hyperosmolarity with coma: Secondary | ICD-10-CM | POA: Diagnosis not present

## 2017-01-31 LAB — POCT GLYCOSYLATED HEMOGLOBIN (HGB A1C): Hemoglobin A1C: 9.5

## 2017-01-31 NOTE — Patient Instructions (Addendum)
blood tests are requested for you today.  Please do in 2-3 weeks. We'll let you know about the results. check your blood sugar twice a day.  vary the time of day when you check, between before the 3 meals, and at bedtime.  also check if you have symptoms of your blood sugar being too high or too low.  please keep a record of the readings and bring it to your next appointment here (or you can bring the meter itself).  You can write it on any piece of paper.  please call us sooner if your blood sugar goes below 70, or if you have a lot of readings over 200.  Please continue the same medications for now.  If you are unable to buy insulin, take regular from [facility] instead of humalog (same number of units), and take NPH 15 units at bedtime instead of the tresiba.  blood tests are requested for you today.  We'll let you know about the results.  Please come back for a follow-up appointment in February.

## 2017-01-31 NOTE — Progress Notes (Signed)
Subjective:    Patient ID: Judith [name], female    DOB: May 28, 1950, 66 y.o.   MRN: [id number]  HPI Pt returns for f/u of hyperthyroidism (pt says she took synthroid for a brief time in the 1990's, in an attempt to shrink a goiter; slightly suppressed TSH was first noted in late 2016, when she was in the hospital for new-onset AF; she converted back to SR; nuc med scan showed heterogeneous uptake; neck scar is from C-spine procedure). she takes tapazole as rx'ed.  pt states she feels well in general.  Denies fever.   Pt also requests rx for DM: DM type: Insulin-requiring type 2. Dx'ed: 5329 Complications: polyneuropathy, nephropathy, retinopathy, and CAD.  Therapy: insulin since 1997, and metformin GDM: never.  DKA: never. Severe hypoglycemia: never.   Pancreatitis: never.   Other: she takes multiple daily injections.  Interval history: She took prednisone 2 weeks ago, for back pain.  sxs are improved since then.  She says this increased cbg's to the 300's.  It has since re-improved to the mid-100's.  no cbg record, but states cbg's are highest fasting (higher than at HS, despite no HS-snack).   She will lose her health insurance soon.  Past Medical History:  Diagnosis Date  . Abnormal EKG 07/31/2013  . Arthritis    "hands" (03/06/2015)  . Asthma   . Carpal tunnel syndrome, bilateral   . Chronic kidney disease   . Complication of anesthesia    slow to wake up  . Coronary artery disease     2 v CAD with CTO of the RCA and high grade bifurcational LCx/OM stenosis. S/P PCI DES x 2 to the LCx/OM.  Marland Kitchen Diabetic peripheral neuropathy ([place]) "since 1996"  . GERD (gastroesophageal reflux disease)   . Goiter   . Headache    migraines prior to menopause  . History of shingles 06/01/2013  . Hyperlipidemia LDL goal <70 10/13/2015  . Hypertension   . Hyperthyroidism   . PAF (paroxysmal atrial fibrillation) ([place]) 04/29/2015   CHADS2VASC score of 4 now on Apixaban  . Pneumonia ~ 1976  . Tremors  of nervous system   . Type II diabetes mellitus (HCC)    insulin dependent    Past Surgical History:  Procedure Laterality Date  . ABDOMINAL HYSTERECTOMY  1988   age 89; CERVICAL DYSPLASIA; ovaries intact.   . AMPUTATION Right 01/23/2016   Procedure: Right 3rd Ray Amputation;  Surgeon: [name], MD;  Location: [facility];  Service: Orthopedics;  Laterality: Right;  . AMPUTATION Right 02/13/2016   Procedure: Right Transmetatarsal Amputation;  Surgeon: [name], MD;  Location: [facility];  Service: Orthopedics;  Laterality: Right;  . CARDIAC CATHETERIZATION N/A 02/27/2015   Procedure: Left Heart Cath and Coronary Angiography;  Surgeon: [name], MD; LAD 40%, mCFX 80%, OM 70%, RCA 100% calcified       . CARDIAC CATHETERIZATION N/A 03/06/2015   Procedure: Coronary Stent Intervention;  Surgeon: [name], MD;  Location: [facility];  Service: Cardiovascular;  Laterality: N/A;  Mid CX 3.50x12 promus DES w/ 0% resdual and Prox OM1 2.50x20 promus DES w/ 20% residual  . CARPAL TUNNEL RELEASE Right Nov 2015  . CARPAL TUNNEL RELEASE Right 1992; 05/2014   [name]; [place]  . CESAREAN SECTION  1982; 1984  . FOOT NEUROMA SURGERY Bilateral 2000  . KNEE ARTHROSCOPY Right ~ 2003   "meniscus repair"  . SHOULDER OPEN ROTATOR CUFF REPAIR Right 1996; 1998   "w/fracture  repair"  . THYROID SURGERY  2000   "removed lots of nodules"  . TONSILLECTOMY  1976    Social History   Social History  . Marital status: Divorced    Spouse name: N/A  . Number of children: 2  . Years of education: Masters   Occupational History  . Food Therapist, nutritional    Social History Main Topics  . Smoking status: Former Smoker    Packs/day: 0.00    Years: 41.00    Types: Cigarettes    Quit date: 03/05/2015  . Smokeless tobacco: Never Used     Comment: 04/29/2015 "quit smoking cigarettes 02/27/2015"  . Alcohol use No  . Drug use: No  . Sexual activity: Not Currently    Birth control/ protection:  Post-menopausal, Surgical   Other Topics Concern  . 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      Lives with sister in [place].      Tobacco: 1 ppd x 41 years - quit 2016      Alcohol: none      Drugs: none      Exercise:  Walking in neighborhood; physical job.   Right-handed.   2 cups caffeine daily.       Current Outpatient Prescriptions on File Prior to Visit  Medication Sig Dispense Refill  . albuterol (PROVENTIL HFA) 108 (90 Base) MCG/ACT inhaler Inhale 1-2 puffs into the lungs every 6 (six) hours as needed for wheezing or shortness of breath. 1 Inhaler 0  . amLODipine (NORVASC) 5 MG tablet Take 1 tablet (5 mg total) by mouth daily. 30 tablet 11  . apixaban (ELIQUIS) 5 MG TABS tablet Take 1 tablet (5 mg total) by mouth 2 (two) times daily. 60 tablet 11  . atorvastatin (LIPITOR) 20 MG tablet Take 1 tablet (20 mg total) by mouth daily. 90 tablet 1  . buPROPion (WELLBUTRIN SR) 150 MG 12 hr tablet Take 1 tablet (150 mg total) by mouth 2 (two) times daily. 180 tablet 3  . clopidogrel (PLAVIX) 75 MG tablet TAKE ONE TABLET BY MOUTH ONCE DAILY (Patient taking differently: TAKE 75 MG BY MOUTH ONCE DAILY) 90 tablet 3  . cyclobenzaprine (FLEXERIL) 5 MG tablet TAKE 1 TABLET BY MOUTH THREE TIMES DAILY AS NEEDED FOR MUSCLE SPASM 30 tablet 0  . fenofibrate (TRICOR) 145 MG tablet Take 1 tablet (145 mg total) by mouth daily. 90 tablet 3  . fluconazole (DIFLUCAN) 100 MG tablet Take Diflucan 100 mg PO daily for 10 days 10 tablet 0  . FLUoxetine (PROZAC) 20 MG capsule Take 1 capsule (20 mg total) by mouth at bedtime. 90 capsule 3  . fluticasone (FLOVENT HFA) 44 MCG/ACT inhaler Inhale 2 puffs into the lungs 2 (two) times daily as needed (shortness of breath/ wheezing).       . gabapentin (NEURONTIN) 300 MG capsule Take 3 capsules (900 mg total) by mouth 2 (two) times daily. TAKE 3 CAPSULES BY MOUTH EVERY  MORNING AND 3 CAPSULES AT BEDTIME 540 capsule 3  . insulin degludec (TRESIBA FLEXTOUCH) 100 UNIT/ML SOPN FlexTouch Pen Inject 0.2 mLs (20 Units total) into the skin at bedtime. 5 pen 11  . Insulin Pen Needle 32G X 4 MM MISC Use to inject insulin daily 100  each 5  . insulin regular (NOVOLIN R RELION) 100 units/mL injection Inject 15-25-10 units three times daily just before each meal. 10 mL 11  . Insulin Syringe-Needle U-100 (INSULIN SYRINGE .5CC/31GX5/16") 31G X 5/16" 0.5 ML MISC Use to inject insulin 100 each 5  . losartan (COZAAR) 50 MG tablet TAKE ONE TABLET BY MOUTH ONCE DAILY (Patient taking differently: TAKE 50 MG BY MOUTH ONCE DAILY) 90 tablet 3  . metFORMIN (GLUCOPHAGE-XR) 500 MG 24 hr tablet Take 4 tablets (2,000 mg total) by mouth daily with breakfast. (Patient taking differently: Take 1,000 mg by mouth 2 (two) times daily. ) 360 tablet 3  . methimazole (TAPAZOLE) 10 MG tablet Take 1 tablet (10 mg total) by mouth daily. 30 tablet 11  . metoprolol succinate (TOPROL-XL) 25 MG 24 hr tablet TAKE ONE TABLET BY MOUTH TWICE DAILY 180 tablet 2  . nitroGLYCERIN (NITRO-DUR) 0.8 MG/HR Place 1 patch onto the skin daily.    . nitroGLYCERIN (NITROSTAT) 0.4 MG SL tablet Place 1 tablet (0.4 mg total) under the tongue every 5 (five) minutes as needed for chest pain. 25 tablet 3  . ondansetron (ZOFRAN) 4 MG tablet Take 1 tablet (4 mg total) by mouth every 8 (eight) hours as needed for nausea or vomiting. 90 tablet 1  . pantoprazole (PROTONIX) 40 MG tablet Take 1 tablet (40 mg total) by mouth daily. 90 tablet 3  . predniSONE (DELTASONE) 10 MG tablet Take 3 tablets (30 mg total) by mouth daily with breakfast. 15 tablet 0  . silver sulfADIAZINE (SILVADENE) 1 % cream Apply 1 application topically daily. 400 g 0  . sotalol (BETAPACE) 80 MG tablet TAKE ONE TABLET BY MOUTH EVERY 12  HOURS 180 tablet 2  . traMADol (ULTRAM) 50 MG tablet Take 1 tablet (50 mg total) by mouth every 6 (six) hours as needed for moderate pain or severe pain. 360 tablet 1   No current facility-administered medications on file prior to visit.     Allergies  Allergen Reactions  . Dilaudid [Hydromorphone Hcl] Other (See Comments)    HEADACHE  . Novolog [Insulin Aspart] Shortness Of Breath    "breathing problems"  . Codeine Nausea And Vomiting    HIGH DOSES-SEVERE VOMITING  . Iodine Other (See Comments)    MUST HAVE BENADRYL PRIOR TO PROCEDURE AND RIGHT BEFORE TREATMENT TO COUNTERACT REACTION-BLISTERING REACTION DERMATOLOGICAL  . Penicillins Itching, Rash and Other (See Comments)    Has patient had a PCN reaction causing immediate rash, facial/tongue/throat swelling, SOB or lightheadedness with hypotension: Yes Has patient had a PCN reaction causing severe rash involving mucus membranes or skin necrosis: No Has patient had a PCN reaction that required hospitalization No Has patient had a PCN reaction occurring within the last 10 years: No If all of the above answers are "NO", then may proceed with Cephalosporin use.  CHEST SIZED RASH AND ITCHING   . Propofol Other (See Comments)    "Breathing problems - asthma attack"  . Ace Inhibitors Cough  . Demerol [Meperidine] Nausea And Vomiting  . Neosporin [Neomycin-Bacitracin Zn-Polymyx] Itching and Rash    MAKES REACTIONS WORSE WHEN USING AS PROPHYLACTIC  . Percocet [Oxycodone-Acetaminophen] Rash  . Tape Itching and Rash    Family History  Problem Relation Age of Onset  . Cancer Mother 29       bronchial cancer  . Breast cancer Mother   . Lung cancer Mother   . Hypertension Father   . COPD Father   . Heart disease Father  85       CAD with cardiac stenting  . Heart attack Father   . Parkinson's disease Father   . Allergies Sister   . Breast cancer Maternal Grandmother   . Emphysema Maternal Grandfather   . Leukemia Paternal  Grandmother   . Emphysema Paternal Grandfather   . Thyroid disease Neg Hx     BP 130/84   Pulse 73   Wt 180 lb 6.4 oz (81.8 kg)   SpO2 97%   BMI 31.96 kg/m   Review of Systems She denies hypoglycemia, but she has weight gain.      Objective:   Physical Exam VITAL SIGNS:  See vs page GENERAL: no distress.  In wheelchair Pulses: left dorsalis pedis intact.   MSK: no deformity of the feet, except for right transmetatarsal amputation.   CV: 1+ bilat leg edema.   Skin: normal color and temp on the feet.  Neuro: sensation is intact to touch on the feet, but severely decreased from normal Ext: There is onychomycosis of the left foot toenails.    Lab Results  Component Value Date   HGBA1C 9.5 01/31/2017      Assessment & Plan:  Insulin-requiring type 2 DM, with CAD:  Recheck fructosamine in a few weeks.  Back pain, new. This is increasing a1c. Hyperthyroidism: due for recheck.   Patient Instructions  blood tests are requested for you today.  Please do in 2-3 weeks. We'll let you know about the results. check your blood sugar twice a day.  vary the time of day when you check, between before the 3 meals, and at bedtime.  also check if you have symptoms of your blood sugar being too high or too low.  please keep a record of the readings and bring it to your next appointment here (or you can bring the meter itself).  You can write it on any piece of paper.  please call us sooner if your blood sugar goes below 70, or if you have a lot of readings over 200.  Please continue the same medications for now.  If you are unable to buy insulin, take regular from [facility] instead of humalog (same number of units), and take NPH 15 units at bedtime instead of the tresiba.  blood tests are requested for you today.  We'll let you know about the results.  Please come back for a follow-up appointment in February.

## 2017-02-01 ENCOUNTER — Other Ambulatory Visit: Payer: Self-pay

## 2017-02-01 MED ORDER — INSULIN ASPART 100 UNIT/ML ~~LOC~~ SOLN: SUBCUTANEOUS | 3 refills | Status: DC

## 2017-02-01 NOTE — Telephone Encounter (Signed)
Novolog has been ordered for the patient - pharmacy and patient have been contacted

## 2017-02-02 ENCOUNTER — Other Ambulatory Visit: Payer: Self-pay | Admitting: Physician Assistant

## 2017-02-03 ENCOUNTER — Ambulatory Visit (INDEPENDENT_AMBULATORY_CARE_PROVIDER_SITE_OTHER): Payer: BLUE CROSS/BLUE SHIELD | Admitting: Urgent Care

## 2017-02-03 ENCOUNTER — Encounter: Payer: Self-pay | Admitting: Urgent Care

## 2017-02-03 VITALS — BP 134/80 | HR 75 | Temp 98.2°F | Resp 18 | Ht 63.0 in | Wt 181.4 lb

## 2017-02-03 DIAGNOSIS — S31109A Unspecified open wound of abdominal wall, unspecified quadrant without penetration into peritoneal cavity, initial encounter: Secondary | ICD-10-CM

## 2017-02-03 NOTE — Progress Notes (Signed)
  MRN: [id number] DOB: 1951-03-03  Subjective:   Judith [name] is a 66 y.o. female presenting for chief complaint of Insect Bite (patient presents with red, open insect bite on the bottom of her vagina. Patient states that she noticed this a few days.)  Reports 5 day history of wound over her pelvic area. She noticed it after showering one day and has since been putting polysporin over the area. Denies fever, redness, warmth, drainage of pus or bleeding, trauma, swelling, itching.   Judith [name] has a current medication list which includes the following prescription(s): albuterol, amlodipine, apixaban, atorvastatin, bupropion, clopidogrel, cyclobenzaprine, fenofibrate, fluconazole, fluoxetine, fluticasone, gabapentin, insulin aspart, insulin degludec, insulin pen needle, insulin regular, insulin syringe .5cc/31gx5/16", losartan, metformin, methimazole, metoprolol succinate, nitroglycerin, nitroglycerin, ondansetron, pantoprazole, prednisone, silver sulfadiazine, sotalol, and tramadol. Also is allergic to dilaudid [hydromorphone hcl]; novolog [insulin aspart]; codeine; iodine; penicillins; propofol; ace inhibitors; demerol [meperidine]; neosporin [neomycin-bacitracin zn-polymyx]; percocet [oxycodone-acetaminophen]; and tape.  Judith [name]  has a past medical history of Abnormal EKG (07/31/2013); Arthritis; Asthma; Carpal tunnel syndrome, bilateral; Chronic kidney disease; Complication of anesthesia; Coronary artery disease; Diabetic peripheral neuropathy (HCC) ("since 1996"); GERD (gastroesophageal reflux disease); Goiter; Headache; History of shingles (06/01/2013); Hyperlipidemia LDL goal <70 (10/13/2015); Hypertension; Hyperthyroidism; PAF (paroxysmal atrial fibrillation) ([place]) (04/29/2015); Pneumonia (~ 1976); Tremors of nervous system; and Type II diabetes mellitus ([place]). Also  has a past surgical history that includes Tonsillectomy (1976); Cesarean section (2703; 1984); Carpal tunnel release (Right, Nov 2015); Knee  arthroscopy (Right, ~ 2003); Shoulder open rotator cuff repair (Right, 1996; 1998); Foot neuroma surgery (Bilateral, 2000); Carpal tunnel release (Right, 1992; 05/2014); Thyroid surgery (2000); Cardiac catheterization (N/A, 02/27/2015); Cardiac catheterization (N/A, 03/06/2015); Abdominal hysterectomy (1988); Amputation (Right, 01/23/2016); and Amputation (Right, 02/13/2016).  Objective:   Vitals: BP 134/80 (BP Location: Right Arm, Patient Position: Sitting, Cuff Size: Large)   Pulse 75   Temp 98.2 F (36.8 C) (Oral)   Resp 18   Ht 5\' 3"  (1.6 m)   Wt 181 lb 6.4 oz (82.3 kg)   SpO2 98%   BMI 32.13 kg/m   Physical Exam  Constitutional: She is oriented to person, place, and time. She appears well-developed and well-nourished.  Cardiovascular: Normal rate.   Pulmonary/Chest: Effort normal.  Neurological: She is alert and oriented to person, place, and time.  Skin:      WOUND CARE: non-adherent dressing applied with Tegaderm.  Assessment and Plan :   1. Open wound of abdomen, initial encounter - Will monitor. Wound care reviewed. Recheck in 1 week. Return-to-clinic precautions discussed, patient verbalized understanding.   [name], PA-C [facility] [phone] 02/03/2017  11:50 AM

## 2017-02-03 NOTE — Patient Instructions (Addendum)
How to Change Your Dressing A dressing is a material that is placed in and over wounds. A dressing helps your wound to heal by protecting it from bacteria, further injury, and becoming too dry or too wet. What are the risks? The adhesive tape that is used with a dressing may make your skin sore or irritated or cause a rash. These are the most common problems. However, more serious problems can develop, such as:  Bleeding.  Infection.  How to change your dressing How often you change your dressing will depend on your wound. Change the dressing as often as told by your health care provider. Preparing to Change Your Dressing  Take a shower before you do the first dressing change of the day. If your health care provider does not want your wound to get wet and your dressing is not waterproof, you may need to apply plastic leak-proof sealing wrap to your dressing for protection.  If needed, take pain medicine 30 minutes before the dressing change as prescribed by your health care provider.  Set up a clean station for wound care. You will need: ? A disposable garbage bag that is open and ready to use. ? Hand sanitizer. ? Wound cleanser or salt-water solution (saline) as told by your health care provider. ? New dressing material or bandages. Make sure to open the dressing package so the dressing remains on the inside of the package. You may also need the following in your clean station:  A box of vinyl gloves.  Tape.  Skin protectant. This may be a wipe, film, or spray.  Clean or germ-free (sterile) scissors.  A cotton-tipped applicator.  Removing Your Old Dressing  Wash your hands with soap and water. Dry your hands with a clean towel. If soap and water are not available, use hand sanitizer.  If you are using gloves, put the gloves on before you remove the dressing.  Gently remove any adhesive or tape by pulling it off in the direction of your hair growth. Only touch the outside  edges of the dressing.  Take off the dressing. If the dressing sticks to your skin, use a sterile salt-water solution to wet the dressing. This helps it to come off more easily.  Remove any gauze or packing in your wound.  Throw the old dressing supplies into the ready garbage bag.  Remove each glove by grabbing the cuff with the opposite hand and turning the glove inside out. Place the gloves in the trash immediately.  Wash your hands with soap and water. Dry your hands with a clean towel. If soap and water are not available, use hand sanitizer. Cleaning Your Wound  Follow instructions from your health care provider about how to clean your wound. This may include using a saline or recommended wound cleanser.  Do not use over-the-counter medicated or antiseptic creams, sprays, liquids, or dressings unless told to do so by your health care provider.  Use a clean gauze pad to clean the area thoroughly with the recommended saline solution or wound cleanser.  Throw the gauze pad into the garbage bag.  Wash your hands with soap and water. Dry your hands with a clean towel. If soap and water are not available, use hand sanitizer. Applying the Dressing  If your health care provider recommended a skin protectant, apply it to the skin around the wound.  Cover the wound with the recommended dressing, such as a nonstick gauze or bandage. Make sure to touch only the outside  edges of the dressing. Do not touch the inside of the dressing.  Secure the dressing so all sides stay in place. You may do this with the attached medical adhesive, roll gauze, or tape. If you use tape, do not wrap the tape all the way around your arm or leg.  Take off your gloves. Put them in the plastic bag with the old dressing. Tie the bag shut and throw it away.  Wash your hands with soap and water. Dry your hands with a clean towel. If soap and water are not available, use hand sanitizer. Contact a health care provider  if:   You have new pain.  You develop irritation, a rash, or itching around the wound or dressing.  Changing your dressing causes pain or a lot of bleeding. Get help right away if:  You have severe pain.  You have signs of infection, such as: ? More redness, swelling, or pain. ? More fluid or blood. ? Warmth. ? Pus or a bad smell. ? Red streaks leading from wound. ? A fever. This information is not intended to replace advice given to you by your health care provider. Make sure you discuss any questions you have with your health care provider. Document Released: 06/03/2004 Document Revised: 09/24/2015 Document Reviewed: 01/30/2015 Elsevier Interactive Patient Education  2018 [employer].     IF you received an x-ray today, you will receive an invoice from [facility]. Please contact [facility] at [phone] with questions or concerns regarding your invoice.   IF you received labwork today, you will receive an invoice from [facility]. Please contact [facility] at [phone] with questions or concerns regarding your invoice.   Our billing staff will not be able to assist you with questions regarding bills from these companies.  You will be contacted with the lab results as soon as they are available. The fastest way to get your results is to activate your My Chart account. Instructions are located on the last page of this paperwork. If you have not heard from [place] regarding the results in 2 weeks, please contact this office.

## 2017-02-08 ENCOUNTER — Telehealth: Payer: Self-pay | Admitting: Physician Assistant

## 2017-02-08 NOTE — Telephone Encounter (Signed)
Raquel Sarna from phy therapy called stated that she has sent over a plan of care that needs to be signed by clark she sent it 9/7 and 9/28 to the upstairs fax.. No one was in medical records until 10.Rockey Situ emily to send to [phone] fax.Marland Kitchen

## 2017-02-08 NOTE — Telephone Encounter (Signed)
Please sign and send form when possible

## 2017-02-09 NOTE — Telephone Encounter (Signed)
Form in the red folder in you box, please sign and return to me. Thank you/ S.Stephfon Bovey,CMA

## 2017-02-10 ENCOUNTER — Ambulatory Visit (INDEPENDENT_AMBULATORY_CARE_PROVIDER_SITE_OTHER): Payer: BLUE CROSS/BLUE SHIELD | Admitting: Urgent Care

## 2017-02-10 ENCOUNTER — Encounter: Payer: Self-pay | Admitting: Urgent Care

## 2017-02-10 VITALS — BP 140/80 | HR 77 | Temp 97.8°F | Resp 18 | Ht 63.0 in | Wt 178.8 lb

## 2017-02-10 DIAGNOSIS — S31109D Unspecified open wound of abdominal wall, unspecified quadrant without penetration into peritoneal cavity, subsequent encounter: Secondary | ICD-10-CM

## 2017-02-10 DIAGNOSIS — R21 Rash and other nonspecific skin eruption: Secondary | ICD-10-CM

## 2017-02-10 DIAGNOSIS — I25118 Atherosclerotic heart disease of native coronary artery with other forms of angina pectoris: Secondary | ICD-10-CM | POA: Diagnosis not present

## 2017-02-10 DIAGNOSIS — I481 Persistent atrial fibrillation: Secondary | ICD-10-CM

## 2017-02-10 DIAGNOSIS — I10 Essential (primary) hypertension: Secondary | ICD-10-CM

## 2017-02-10 DIAGNOSIS — I4819 Other persistent atrial fibrillation: Secondary | ICD-10-CM

## 2017-02-10 MED ORDER — NITROGLYCERIN 0.4 MG SL SUBL: 0.4000 mg | SUBLINGUAL_TABLET | SUBLINGUAL | 3 refills | Status: DC | PRN

## 2017-02-10 MED ORDER — CLOTRIMAZOLE-BETAMETHASONE 1-0.05 % EX CREA
1.0000 | TOPICAL_CREAM | Freq: Two times a day (BID) | CUTANEOUS | 0 refills | Status: DC
Start: 2017-02-10 — End: 2017-07-05

## 2017-02-10 MED ORDER — CYCLOBENZAPRINE HCL 5 MG PO TABS: 5.0000 mg | ORAL_TABLET | Freq: Three times a day (TID) | ORAL | 1 refills | Status: DC | PRN

## 2017-02-10 NOTE — Patient Instructions (Addendum)
Betamethasone; Clotrimazole skin cream What is this medicine? BETAMETHASONE; CLOTRIMAZOLE (bay ta METH a sone; kloe TRIM a zole) is a corticosteroid and antifungal cream. It treats ringworm and infections like jock itch and athlete's foot. It also helps reduce swelling, redness, and itching caused by these infections. This medicine may be used for other purposes; ask your health care provider or pharmacist if you have questions. COMMON BRAND NAME(S): Lotrisone What should I tell my health care provider before I take this medicine? They need to know if you have any of these conditions: -large areas of burned or damaged skin -skin thinning -peripheral vascular disease or poor circulation -an unusual or allergic reaction to betamethasone, clotrimazole, other corticosteroids, other antifungals, other medicines, foods, dyes, or preservatives -pregnant or trying to get pregnant -breast-feeding How should I use this medicine? This cream is for external use only. Do not take by mouth. Follow the directions on the prescription label. Wash your hands before and after use. If treating hand or nail infections, wash hands before use only. Apply a thin layer of cream to the affected area and rub in gently. Do not cover or wrap the treated area with an airtight bandage (like a plastic bandage). Use the cream for the full course of treatment prescribed, even if you think the condition is getting better. Use the medicine at regular intervals. Do not use more often than directed. Do not use on healthy skin or over large areas of skin. Do not use this medicine for any condition other than the one for which it was prescribed. When applying to the groin area, apply a small amount and do not use for longer than 2 weeks unless directed to by your doctor or health care professional. Do not get this cream in your eyes. If you do, rinse out with plenty of cool tap water. Talk to your pediatrician regarding the use of this  medicine in children. While this drug may be prescribed for children as young as 17 years for selected conditions, precautions do apply. Patients over 24 years old may have a stronger reaction and need a smaller dose. Overdosage: If you think you have taken too much of this medicine contact a poison control center or emergency room at once. NOTE: This medicine is only for you. Do not share this medicine with others. What if I miss a dose? If you miss a dose, use it as soon as you can. If it is almost time for your next dose, use only that dose. Do not use double or take extra doses. What may interact with this medicine? -topical products that have nystatin This list may not describe all possible interactions. Give your health care provider a list of all the medicines, herbs, non-prescription drugs, or dietary supplements you use. Also tell them if you smoke, drink alcohol, or use illegal drugs. Some items may interact with your medicine. What should I watch for while using this medicine? If using this medicine on your body or groin tell your doctor or health care professional if your symptoms do not improve within 1 week. If using this medicine on your feet tell your doctor or health care professional if your symptoms do not improve within 2 weeks. Tell your doctor if your skin infection returns after you stop using this cream. If you are using this cream for 'jock itch' be sure to dry the groin completely after bathing. Do not wear underwear that is tight-fitting or made from synthetic fibers like rayon or  nylon. Wear loose-fitting, cotton underwear. If you are using this cream for athlete's foot be sure to dry your feet carefully after bathing, especially between the toes. Do not wear socks made from wool or synthetic materials like rayon or nylon. Wear clean cotton socks and change them at least once a day, change them more if your feet sweat a lot. Also, try to wear sandals or shoes that are  well-ventilated. Do not use this cream to treat diaper rash. What side effects may I notice from receiving this medicine? Side effects that you should report to your doctor or health care professional as soon as possible: -allergic reactions like skin rash, itching or hives, swelling of the face, lips, or tongue -dark red spots on the skin -lack of healing of skin condition -loss of feeling on skin -painful, red, pus-filled blisters in hair follicles -skin infection -sores or blisters that do not heal properly -thinning of the skin or sunburn Side effects that usually do not require medical attention (report to your doctor or health care professional if they continue or are bothersome): -dry or peeling skin -minor skin irritation, burning, or itching This list may not describe all possible side effects. Call your doctor for medical advice about side effects. You may report side effects to FDA at 1-800-FDA-1088. Where should I keep my medicine? Keep out of the reach of children. Store at room temperature between 15 and 30 degrees C ( 59 and 86 degrees F). Do not freeze. Throw away any unused medicine after the expiration date. NOTE: This sheet is a summary. It may not cover all possible information. If you have questions about this medicine, talk to your doctor, pharmacist, or health care provider.  2018 Elsevier/Gold Standard (2007-07-26 16:14:28)     IF you received an x-ray today, you will receive an invoice from [facility]. Please contact [facility] at [phone] with questions or concerns regarding your invoice.   IF you received labwork today, you will receive an invoice from [facility]. Please contact [facility] at [phone] with questions or concerns regarding your invoice.   Our billing staff will not be able to assist you with questions regarding bills from these companies.  You will be contacted with the lab results as soon as they are available. The  fastest way to get your results is to activate your My Chart account. Instructions are located on the last page of this paperwork. If you have not heard from [place] regarding the results in 2 weeks, please contact this office.

## 2017-02-10 NOTE — Progress Notes (Signed)
    MRN: [id number] DOB: 1950/05/15  Subjective:   Judith [name] is a 66 y.o. female presenting for follow up on skin infection. Last OV was 02/03/2017 for the same. We held off on antibiotic course due to lack of pain, redness, drainage of pus or bleeding, fever. She has kept her wound clean, dry. Today, she reports another red scaly spot of her lower abdomen. This is not new, has been much worse in the past, states that it typically turns into an infection. Denies ROS as above.  Myrene has a current medication list which includes the following prescription(s): albuterol, amlodipine, apixaban, atorvastatin, bupropion, clopidogrel, cyclobenzaprine, fenofibrate, fluconazole, fluoxetine, fluticasone, gabapentin, insulin aspart, insulin degludec, insulin pen needle, insulin regular, insulin syringe .5cc/31gx5/16", losartan, metformin, methimazole, metoprolol succinate, nitroglycerin, nitroglycerin, ondansetron, pantoprazole, prednisone, silver sulfadiazine, sotalol, and tramadol. Also is allergic to dilaudid [hydromorphone hcl]; novolog [insulin aspart]; codeine; iodine; penicillins; propofol; ace inhibitors; demerol [meperidine]; neosporin [neomycin-bacitracin zn-polymyx]; percocet [oxycodone-acetaminophen]; and tape.  [place]  has a past medical history of Abnormal EKG (07/31/2013); Arthritis; Asthma; Carpal tunnel syndrome, bilateral; Chronic kidney disease; Complication of anesthesia; Coronary artery disease; Diabetic peripheral neuropathy (HCC) ("since 1996"); GERD (gastroesophageal reflux disease); Goiter; Headache; History of shingles (06/01/2013); Hyperlipidemia LDL goal <70 (10/13/2015); Hypertension; Hyperthyroidism; PAF (paroxysmal atrial fibrillation) ([place]) (04/29/2015); Pneumonia (~ 1976); Tremors of nervous system; and Type II diabetes mellitus ([place]). 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  Objective:   Vitals: BP 140/80 (BP Location: Left Arm, Patient Position: Sitting, Cuff Size: Normal)   Pulse 77   Temp 97.8 F (36.6 C) (Oral)   Resp 18   Ht 5\' 3"  (1.6 m)   Wt 178 lb 12.8 oz (81.1 kg)   SpO2 99%   BMI 31.67 kg/m   Physical Exam  Constitutional: She is oriented to person, place, and time. She appears well-developed and well-nourished.  Cardiovascular: Normal rate, regular rhythm and intact distal pulses.  Exam reveals no gallop and no friction rub.   No murmur heard. Pulmonary/Chest: Effort normal. No respiratory distress. She has no wheezes. She has no rales.  Abdominal:    Neurological: She is alert and oriented to person, place, and time.    Assessment and Plan :   1. Open wound of abdomen, subsequent encounter 2. Rash and nonspecific skin eruption - Initial wound is resolving very well. Will have her start clotrimazole with betamethasone for the other rash. F/u is there is no improvement.  3. Coronary artery disease of native artery of native heart with stable angina pectoris ([place]) 4. Essential hypertension 5. Persistent atrial fibrillation ([place]) - Patient requested refills for multiple medications at the end of her visit. I did a brief ROS with patient. Denies chest pain, heart racing, palpitations. Has not needed to use nitro in months. Follow up with PCP.  [name], PA-C [facility] [phone] 02/10/2017 12:03 PM

## 2017-02-21 ENCOUNTER — Other Ambulatory Visit: Payer: BLUE CROSS/BLUE SHIELD

## 2017-02-24 ENCOUNTER — Ambulatory Visit (INDEPENDENT_AMBULATORY_CARE_PROVIDER_SITE_OTHER): Payer: BLUE CROSS/BLUE SHIELD | Admitting: Physician Assistant

## 2017-02-24 ENCOUNTER — Ambulatory Visit (INDEPENDENT_AMBULATORY_CARE_PROVIDER_SITE_OTHER): Payer: BLUE CROSS/BLUE SHIELD

## 2017-02-24 ENCOUNTER — Encounter: Payer: Self-pay | Admitting: Physician Assistant

## 2017-02-24 VITALS — HR 80 | Temp 98.4°F | Resp 16 | Ht 63.0 in | Wt 180.4 lb

## 2017-02-24 DIAGNOSIS — R0781 Pleurodynia: Secondary | ICD-10-CM

## 2017-02-24 DIAGNOSIS — R51 Headache: Secondary | ICD-10-CM

## 2017-02-24 DIAGNOSIS — R519 Headache, unspecified: Secondary | ICD-10-CM

## 2017-02-24 NOTE — Progress Notes (Signed)
02/28/2017 8:22 AM   DOB: [birth year] / MRN: [id number]  SUBJECTIVE:  Judith [name] is a 66 y.o. female presenting for pain that occurred after a mechanical fall.  She hit her head and denies a loss of conscience.  Has two black eye and denies any change in vision.  Has some porterior right rib pain that also started after the fall.  Has multiple pain medications at her disposal given a history of chronic back pain and peripheral neuropathy 2/2 diabetes managed by endo.     She is allergic to dilaudid [hydromorphone hcl]; novolog [insulin aspart]; codeine; iodine; penicillins; propofol; ace inhibitors; demerol [meperidine]; neosporin [neomycin-bacitracin zn-polymyx]; percocet [oxycodone-acetaminophen]; and tape.   She  has a past medical history of Abnormal EKG (07/31/2013); Arthritis; Asthma; Carpal tunnel syndrome, bilateral; Chronic kidney disease; Complication of anesthesia; Coronary artery disease; Diabetic peripheral neuropathy (HCC) ("since 1996"); GERD (gastroesophageal reflux disease); Goiter; Headache; History of shingles (06/01/2013); Hyperlipidemia LDL goal <70 (10/13/2015); Hypertension; Hyperthyroidism; PAF (paroxysmal atrial fibrillation) ([place]) (04/29/2015); Pneumonia (~ 1976); Tremors of nervous system; and Type II diabetes mellitus ([place]).    She  reports that she quit smoking about 1 years ago. Her smoking use included Cigarettes. She smoked 0.00 packs per day for 41.00 years. She has never used smokeless tobacco. She reports that she does not drink alcohol or use drugs. She  reports that she does not currently engage in sexual activity. She reports using the following methods of birth control/protection: Post-menopausal and Surgical. 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  Her family history includes Allergies in her sister; Breast cancer in her maternal grandmother and mother; COPD in her father; Cancer (age of onset: 55) in her mother; Emphysema in her maternal grandfather and paternal grandfather; Heart attack in her father; Heart disease (age of onset: 28) in her father; Hypertension in her father; Leukemia in her paternal grandmother; Lung cancer in her mother; Parkinson's disease in her father.  Review of Systems  Constitutional: Negative for chills, diaphoresis and fever.  Eyes: Negative.   Respiratory: Negative for cough, hemoptysis, sputum production, shortness of breath and wheezing.   Cardiovascular: Negative for chest pain, orthopnea and leg swelling.  Gastrointestinal: Negative for nausea.  Skin: Negative for rash.  Neurological: Negative for dizziness, sensory change, speech change, focal weakness and headaches.    The problem list and medications were reviewed and updated by myself where necessary and exist elsewhere in the encounter.   OBJECTIVE:  Pulse 80   Temp 98.4 F (36.9 C) (Oral)   Resp 16   Ht 5\' 3"  (1.6 m)   Wt 180 lb 6.4 oz (81.8 kg)   SpO2 97%   BMI 31.96 kg/m   Physical Exam  Constitutional: She is active.  Non-toxic appearance.  HENT:  Head: Not microcephalic. Head is with raccoon's eyes. Head is without Battle's sign, without right periorbital erythema and without left periorbital erythema.    Right Ear: No hemotympanum.  Left Ear: No hemotympanum.  Nose: Nose normal. No rhinorrhea.  Mouth/Throat: Uvula is midline, oropharynx is clear and moist and mucous membranes are normal.  Cardiovascular: Normal rate, regular rhythm, S1  normal, S2 normal, normal  heart sounds and intact distal pulses.  Exam reveals no gallop, no friction rub and no decreased pulses.   No murmur heard. Pulmonary/Chest: Effort normal. No stridor. No tachypnea. No respiratory distress. She has no wheezes. She has no rales.  Abdominal: She exhibits no distension.  Musculoskeletal: She exhibits no edema.       Arms: Neurological: She is alert.  Skin: Skin is warm and dry. She is not diaphoretic. No pallor.    No results found for this or any previous visit (from the past 72 hour(s)).  No results found.  ASSESSMENT AND PLAN:  Amandeep was seen today for injury.  Diagnoses and all orders for this visit:  Facial pain: No loc. Bruising around the eyes secondary to gravitational ecchymosis. She has a good pain plan in place and will continue.  -     DG Sinuses Complete; Future  Rib pain on left side: New non displaced rib fracture. Will see her back in about 3 weeks for recheck on this.  Sooner if needed.  -     DG Chest 2 View; Future    The patient is advised to call or return to clinic if she does not see an improvement in symptoms, or to seek the care of the closest emergency department if she worsens with the above plan.   Philis Fendt, MHS, PA-C [facility] 02/28/2017 8:22 AM

## 2017-02-24 NOTE — Patient Instructions (Addendum)
Focus on deep breathing daily to prevent atelectasis/pneumoina. Come back in 3 weeks for a recheck.  If you develop any worsening chest pain and/or SOB then come back soon.       IF you received an x-ray today, you will receive an invoice from [facility]. Please contact [facility] at [phone] with questions or concerns regarding your invoice.   IF you received labwork today, you will receive an invoice from [place]. Please contact [facility] at [phone] with questions or concerns regarding your invoice.   Our billing staff will not be able to assist you with questions regarding bills from these companies.  You will be contacted with the lab results as soon as they are available. The fastest way to get your results is to activate your My Chart account. Instructions are located on the last page of this paperwork. If you have not heard from [place] regarding the results in 2 weeks, please contact this office.

## 2017-02-24 NOTE — Progress Notes (Deleted)
Patient ID: Judith [name], female    DOB: 07-25-1950  Age: 66 y.o. MRN: [id number]  No chief complaint on file.   Subjective:   ***  Current allergies, medications, problem list, past/family and social histories reviewed.  Objective:  There were no vitals taken for this visit.  ***  Assessment & Plan:   Assessment: No diagnosis found.    Plan: ***  No orders of the defined types were placed in this encounter.   No orders of the defined types were placed in this encounter.        There are no Patient Instructions on file for this visit.   No Follow-up on file.   Glenisha Gundry, MD 02/24/2017

## 2017-03-07 ENCOUNTER — Ambulatory Visit: Payer: BLUE CROSS/BLUE SHIELD | Admitting: Family Medicine

## 2017-03-07 ENCOUNTER — Ambulatory Visit (INDEPENDENT_AMBULATORY_CARE_PROVIDER_SITE_OTHER): Payer: BLUE CROSS/BLUE SHIELD | Admitting: Family Medicine

## 2017-03-07 ENCOUNTER — Encounter: Payer: Self-pay | Admitting: Family Medicine

## 2017-03-07 VITALS — BP 130/70 | HR 95 | Temp 98.2°F | Resp 17 | Ht 63.0 in | Wt 178.2 lb

## 2017-03-07 DIAGNOSIS — R51 Headache: Secondary | ICD-10-CM | POA: Diagnosis not present

## 2017-03-07 DIAGNOSIS — J321 Chronic frontal sinusitis: Secondary | ICD-10-CM | POA: Diagnosis not present

## 2017-03-07 DIAGNOSIS — R519 Headache, unspecified: Secondary | ICD-10-CM

## 2017-03-07 MED ORDER — DOXYCYCLINE HYCLATE 100 MG PO TABS: 100.0000 mg | ORAL_TABLET | Freq: Two times a day (BID) | ORAL | 0 refills | Status: DC

## 2017-03-07 NOTE — Patient Instructions (Addendum)
Antibiotic - You have received the antibiotic Doxycycline.  It may take several days before you feel any benefit from this medicine. Be sure to take all the medication prescribed, even if you are feeling better before it is finished. Do not drink alcohol while taking antibiotics.  It is unknown whether you will develop an adverse reaction (diarrhea, rash, nausea or vomiting) or an allergic reaction (trouble breathing, anaphylaxis).       IF you received an x-ray today, you will receive an invoice from [facility]. Please contact [facility] at [phone] with questions or concerns regarding your invoice.   IF you received labwork today, you will receive an invoice from [facility]. Please contact [facility] at [phone] with questions or concerns regarding your invoice.   Our billing staff will not be able to assist you with questions regarding bills from these companies.  You will be contacted with the lab results as soon as they are available. The fastest way to get your results is to activate your My Chart account. Instructions are located on the last page of this paperwork. If you have not heard from [place] regarding the results in 2 weeks, please contact this office.     Sinusitis, Adult Sinusitis is soreness and inflammation of your sinuses. Sinuses are hollow spaces in the bones around your face. Your sinuses are located:  Around your eyes.  In the middle of your forehead.  Behind your nose.  In your cheekbones.  Your sinuses and nasal passages are lined with a stringy fluid (mucus). Mucus normally drains out of your sinuses. When your nasal tissues become inflamed or swollen, the mucus can become trapped or blocked so air cannot flow through your sinuses. This allows bacteria, viruses, and funguses to grow, which leads to infection. Sinusitis can develop quickly and last for 7?10 days (acute) or for more than 12 weeks (chronic). Sinusitis often develops after a  cold. What are the causes? This condition is caused by anything that creates swelling in the sinuses or stops mucus from draining, including:  Allergies.  Asthma.  Bacterial or viral infection.  Abnormally shaped bones between the nasal passages.  Nasal growths that contain mucus (nasal polyps).  Narrow sinus openings.  Pollutants, such as chemicals or irritants in the air.  A foreign object stuck in the nose.  A fungal infection. This is rare.  What increases the risk? The following factors may make you more likely to develop this condition:  Having allergies or asthma.  Having had a recent cold or respiratory tract infection.  Having structural deformities or blockages in your nose or sinuses.  Having a weak immune system.  Doing a lot of swimming or diving.  Overusing nasal sprays.  Smoking.  What are the signs or symptoms? The main symptoms of this condition are pain and a feeling of pressure around the affected sinuses. Other symptoms include:  Upper toothache.  Earache.  Headache.  Bad breath.  Decreased sense of smell and taste.  A cough that may get worse at night.  Fatigue.  Fever.  Thick drainage from your nose. The drainage is often green and it may contain pus (purulent).  Stuffy nose or congestion.  Postnasal drip. This is when extra mucus collects in the throat or back of the nose.  Swelling and warmth over the affected sinuses.  Sore throat.  Sensitivity to light.  How is this diagnosed? This condition is diagnosed based on symptoms, a medical history, and a physical exam. To find  out if your condition is acute or chronic, your health care provider may:  Look in your nose for signs of nasal polyps.  Tap over the affected sinus to check for signs of infection.  View the inside of your sinuses using an imaging device that has a light attached (endoscope).  If your health care provider suspects that you have chronic sinusitis,  you may also:  Be tested for allergies.  Have a sample of mucus taken from your nose (nasal culture) and checked for bacteria.  Have a mucus sample examined to see if your sinusitis is related to an allergy.  If your sinusitis does not respond to treatment and it lasts longer than 8 weeks, you may have an MRI or CT scan to check your sinuses. These scans also help to determine how severe your infection is. In rare cases, a bone biopsy may be done to rule out more serious types of fungal sinus disease. How is this treated? Treatment for sinusitis depends on the cause and whether your condition is chronic or acute. If a virus is causing your sinusitis, your symptoms will go away on their own within 10 days. You may be given medicines to relieve your symptoms, including:  Topical nasal decongestants. They shrink swollen nasal passages and let mucus drain from your sinuses.  Antihistamines. These drugs block inflammation that is triggered by allergies. This can help to ease swelling in your nose and sinuses.  Topical nasal corticosteroids. These are nasal sprays that ease inflammation and swelling in your nose and sinuses.  Nasal saline washes. These rinses can help to get rid of thick mucus in your nose.  If your condition is caused by bacteria, you will be given an antibiotic medicine. If your condition is caused by a fungus, you will be given an antifungal medicine. Surgery may be needed to correct underlying conditions, such as narrow nasal passages. Surgery may also be needed to remove polyps. Follow these instructions at home: Medicines  Take, use, or apply over-the-counter and prescription medicines only as told by your health care provider. These may include nasal sprays.  If you were prescribed an antibiotic medicine, take it as told by your health care provider. Do not stop taking the antibiotic even if you start to feel better. Hydrate and Humidify  Drink enough water to keep  your urine clear or pale yellow. Staying hydrated will help to thin your mucus.  Use a cool mist humidifier to keep the humidity level in your home above 50%.  Inhale steam for 10-15 minutes, 3-4 times a day or as told by your health care provider. You can do this in the bathroom while a hot shower is running.  Limit your exposure to cool or dry air. Rest  Rest as much as possible.  Sleep with your head raised (elevated).  Make sure to get enough sleep each night. General instructions  Apply a warm, moist washcloth to your face 3-4 times a day or as told by your health care provider. This will help with discomfort.  Wash your hands often with soap and water to reduce your exposure to viruses and other germs. If soap and water are not available, use hand sanitizer.  Do not smoke. Avoid being around people who are smoking (secondhand smoke).  Keep all follow-up visits as told by your health care provider. This is important. Contact a health care provider if:  You have a fever.  Your symptoms get worse.  Your symptoms do not  improve within 10 days. Get help right away if:  You have a severe headache.  You have persistent vomiting.  You have pain or swelling around your face or eyes.  You have vision problems.  You develop confusion.  Your neck is stiff.  You have trouble breathing. This information is not intended to replace advice given to you by your health care provider. Make sure you discuss any questions you have with your health care provider. Document Released: 04/26/2005 Document Revised: 12/21/2015 Document Reviewed: 02/19/2015 Elsevier Interactive Patient Education  2017 [employer].

## 2017-03-07 NOTE — Progress Notes (Signed)
Chief Complaint  Patient presents with  . Sinusitis    possible sinusitis, ha's x 4 days, per ha pain and its hard to sleepp, lots of pressure behind eyes, intense pain is she leans over, nause and emesis once today.      HPI  Sinus Pain: Patient complains of headache described as pain behind the eyes. Symptoms include right ear pressure/pain, facial pain, lightheadedness, no  fever and pain behind her eyes that is more intense when she leans forward with no fever, chills, night sweats or weight loss. Onset of symptoms was 4 days ago, gradually worsening since that time. She is drinking plenty of fluids.  Past history is significant for pneumonia. Patient is non-smoker She reports that her headache is better when she is in the shower She reports that she had one episode of nausea and vomiting and took zofran She has chronic dizziness.     Past Medical History:  Diagnosis Date  . Abnormal EKG 07/31/2013  . Arthritis    "hands" (03/06/2015)  . Asthma   . Carpal tunnel syndrome, bilateral   . Chronic kidney disease   . Complication of anesthesia    slow to wake up  . Coronary artery disease     2 v CAD with CTO of the RCA and high grade bifurcational LCx/OM stenosis. S/P PCI DES x 2 to the LCx/OM.  Marland Kitchen Diabetic peripheral neuropathy ([place]) "since 1996"  . GERD (gastroesophageal reflux disease)   . Goiter   . Headache    migraines prior to menopause  . History of shingles 06/01/2013  . Hyperlipidemia LDL goal <70 10/13/2015  . Hypertension   . Hyperthyroidism   . PAF (paroxysmal atrial fibrillation) ([place]) 04/29/2015   CHADS2VASC score of 4 now on Apixaban  . Pneumonia ~ 1976  . Tremors of nervous system   . Type II diabetes mellitus (H[facility])    insulin dependent    Current Outpatient Prescriptions  Medication Sig Dispense Refill  . albuterol (PROVENTIL HFA) 108 (90 Base) MCG/ACT inhaler Inhale 1-2 puffs into the lungs every 6 (six) hours as needed for wheezing or shortness of  breath. 1 Inhaler 0  . amLODipine (NORVASC) 5 MG tablet Take 1 tablet (5 mg total) by mouth daily. 30 tablet 11  . apixaban (ELIQUIS) 5 MG TABS tablet Take 1 tablet (5 mg total) by mouth 2 (two) times daily. 60 tablet 11  . atorvastatin (LIPITOR) 20 MG tablet Take 1 tablet (20 mg total) by mouth daily. 90 tablet 1  . buPROPion (WELLBUTRIN SR) 150 MG 12 hr tablet Take 1 tablet (150 mg total) by mouth 2 (two) times daily. 180 tablet 3  . clopidogrel (PLAVIX) 75 MG tablet TAKE ONE TABLET BY MOUTH ONCE DAILY (Patient taking differently: TAKE 75 MG BY MOUTH ONCE DAILY) 90 tablet 3  . clotrimazole-betamethasone (LOTRISONE) cream Apply 1 application topically 2 (two) times daily. 30 g 0  . cyclobenzaprine (FLEXERIL) 5 MG tablet Take 1 tablet (5 mg total) by mouth 3 (three) times daily as needed. for muscle spams 90 tablet 1  . fenofibrate (TRICOR) 145 MG tablet Take 1 tablet (145 mg total) by mouth daily. 90 tablet 3  . fluconazole (DIFLUCAN) 100 MG tablet Take Diflucan 100 mg PO daily for 10 days 10 tablet 0  . FLUoxetine (PROZAC) 20 MG capsule Take 1 capsule (20 mg total) by mouth at bedtime. 90 capsule 3  . fluticasone (FLOVENT HFA) 44 MCG/ACT inhaler Inhale 2 puffs into the lungs 2 (two)  times daily as needed (shortness of breath/ wheezing).     . gabapentin (NEURONTIN) 300 MG capsule Take 3 capsules (900 mg total) by mouth 2 (two) times daily. TAKE 3 CAPSULES BY MOUTH EVERY  MORNING AND 3 CAPSULES AT BEDTIME 540 capsule 3  . insulin aspart (NOVOLOG) 100 UNIT/ML injection Inject 15 units at breakfast 25 units at lunch and 10 units at dinner 2 vial 3  . insulin degludec (TRESIBA FLEXTOUCH) 100 UNIT/ML SOPN FlexTouch Pen Inject 0.2 mLs (20 Units total) into the skin at bedtime. 5 pen 11  . Insulin Pen Needle 32G X 4 MM MISC Use to inject insulin daily 100 each 5  . insulin regular (NOVOLIN R RELION) 100 units/mL injection Inject 15-25-10 units three times daily just before each meal. 10 mL 11  . Insulin  Syringe-Needle U-100 (INSULIN SYRINGE .5CC/31GX5/16") 31G X 5/16" 0.5 ML MISC Use to inject insulin 100 each 5  . losartan (COZAAR) 50 MG tablet TAKE ONE TABLET BY MOUTH ONCE DAILY (Patient taking differently: TAKE 50 MG BY MOUTH ONCE DAILY) 90 tablet 3  . metFORMIN (GLUCOPHAGE-XR) 500 MG 24 hr tablet Take 4 tablets (2,000 mg total) by mouth daily with breakfast. (Patient taking differently: Take 1,000 mg by mouth 2 (two) times daily. ) 360 tablet 3  . methimazole (TAPAZOLE) 10 MG tablet Take 1 tablet (10 mg total) by mouth daily. 30 tablet 11  . metoprolol succinate (TOPROL-XL) 25 MG 24 hr tablet TAKE ONE TABLET BY MOUTH TWICE DAILY 180 tablet 2  . nitroGLYCERIN (NITROSTAT) 0.4 MG SL tablet Place 1 tablet (0.4 mg total) under the tongue every 5 (five) minutes as needed for chest pain. 25 tablet 3  . ondansetron (ZOFRAN) 4 MG tablet Take 1 tablet (4 mg total) by mouth every 8 (eight) hours as needed for nausea or vomiting. 90 tablet 1  . pantoprazole (PROTONIX) 40 MG tablet Take 1 tablet (40 mg total) by mouth daily. 90 tablet 3  . predniSONE (DELTASONE) 10 MG tablet Take 3 tablets (30 mg total) by mouth daily with breakfast. 15 tablet 0  . silver sulfADIAZINE (SILVADENE) 1 % cream Apply 1 application topically daily. 400 g 0  . sotalol (BETAPACE) 80 MG tablet TAKE ONE TABLET BY MOUTH EVERY 12 HOURS 180 tablet 2  . traMADol (ULTRAM) 50 MG tablet Take 1 tablet (50 mg total) by mouth every 6 (six) hours as needed for moderate pain or severe pain. 360 tablet 1  . doxycycline (VIBRA-TABS) 100 MG tablet Take 1 tablet (100 mg total) by mouth 2 (two) times daily. 20 tablet 0  . nitroGLYCERIN (NITRO-DUR) 0.8 MG/HR Place 1 patch onto the skin daily.     No current facility-administered medications for this visit.     Allergies:  Allergies  Allergen Reactions  . Dilaudid [Hydromorphone Hcl] Other (See Comments)    HEADACHE  . Novolog [Insulin Aspart] Shortness Of Breath    "breathing problems"  .  Codeine Nausea And Vomiting    HIGH DOSES-SEVERE VOMITING  . Iodine Other (See Comments)    MUST HAVE BENADRYL PRIOR TO PROCEDURE AND RIGHT BEFORE TREATMENT TO COUNTERACT REACTION-BLISTERING REACTION DERMATOLOGICAL  . Penicillins Itching, Rash and Other (See Comments)    Has patient had a PCN reaction causing immediate rash, facial/tongue/throat swelling, SOB or lightheadedness with hypotension: Yes Has patient had a PCN reaction causing severe rash involving mucus membranes or skin necrosis: No Has patient had a PCN reaction that required hospitalization No Has patient had a PCN  reaction occurring within the last 10 years: No If all of the above answers are "NO", then may proceed with Cephalosporin use.  CHEST SIZED RASH AND ITCHING   . Propofol Other (See Comments)    "Breathing problems - asthma attack"  . Ace Inhibitors Cough  . Demerol [Meperidine] Nausea And Vomiting  . Neosporin [Neomycin-Bacitracin Zn-Polymyx] Itching and Rash    MAKES REACTIONS WORSE WHEN USING AS PROPHYLACTIC  . Percocet [Oxycodone-Acetaminophen] Rash  . Tape Itching and Rash    Past Surgical History:  Procedure Laterality Date  . ABDOMINAL HYSTERECTOMY  1988   age 50; CERVICAL DYSPLASIA; ovaries intact.   . AMPUTATION Right 01/23/2016   Procedure: Right 3rd Ray Amputation;  Surgeon: [name], MD;  Location: [facility];  Service: Orthopedics;  Laterality: Right;  . AMPUTATION Right 02/13/2016   Procedure: Right Transmetatarsal Amputation;  Surgeon: [name], MD;  Location: [facility];  Service: Orthopedics;  Laterality: Right;  . CARDIAC CATHETERIZATION N/A 02/27/2015   Procedure: Left Heart Cath and Coronary Angiography;  Surgeon: [name], MD; LAD 40%, mCFX 80%, OM 70%, RCA 100% calcified       . CARDIAC CATHETERIZATION N/A 03/06/2015   Procedure: Coronary Stent Intervention;  Surgeon: [name], MD;  Location: [facility];  Service: Cardiovascular;  Laterality: N/A;  Mid CX 3.50x12  promus DES w/ 0% resdual and Prox OM1 2.50x20 promus DES w/ 20% residual  . CARPAL TUNNEL RELEASE Right Nov 2015  . CARPAL TUNNEL RELEASE Right 1992; 05/2014   [name]; [place]  . CESAREAN SECTION  1982; 1984  . FOOT NEUROMA SURGERY Bilateral 2000  . KNEE ARTHROSCOPY Right ~ 2003   "meniscus repair"  . SHOULDER OPEN ROTATOR CUFF REPAIR Right 1996; 1998   "w/fracture repair"  . THYROID SURGERY  2000   "removed lots of nodules"  . TONSILLECTOMY  1976    Social History   Social History  . Marital status: Divorced    Spouse name: N/A  . Number of children: 2  . Years of education: Masters   Occupational History  . Food Therapist, nutritional    Social History Main Topics  . Smoking status: Former Smoker    Packs/day: 0.00    Years: 41.00    Types: Cigarettes    Quit date: 03/05/2015  . Smokeless tobacco: Never Used     Comment: 04/29/2015 "quit smoking cigarettes 02/27/2015"  . Alcohol use No  . Drug use: No  . Sexual activity: Not Currently    Birth control/ protection: Post-menopausal, Surgical   Other Topics Concern  . 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      Lives with sister in [place].      Tobacco: 1 ppd x 41 years - quit 2016      Alcohol: none      Drugs: none      Exercise:  Walking in neighborhood; physical job.   Right-handed.   2 cups caffeine daily.       Family History  Problem Relation Age of Onset  . Cancer Mother 46       bronchial cancer  . Breast cancer Mother   . Lung cancer Mother   . Hypertension Father   . COPD  Father   . Heart disease Father 29       CAD with cardiac stenting  . Heart attack Father   . Parkinson's disease Father   . Allergies Sister   . Breast cancer Maternal Grandmother   . Emphysema  Maternal Grandfather   . Leukemia Paternal Grandmother   . Emphysema Paternal Grandfather   . Thyroid disease Neg Hx      ROS Review of Systems See HPI [place]: No fevers or chills No malaise No diaphoresis Skin: No rash or itching Eyes: no blurry vision, no double vision GU: no dysuria or hematuria Neuro:+ dizziness or headaches  Objective: Vitals:   03/07/17 1646  BP: 130/70  Pulse: 95  Resp: 17  Temp: 98.2 F (36.8 C)  TempSrc: Oral  SpO2: 97%  Weight: 178 lb 3.2 oz (80.8 kg)  Height: 5\' 3"  (1.6 m)    Physical Exam General: alert, oriented, in NAD Head: normocephalic, atraumatic, no sinus tenderness Eyes: EOM intact, no scleral icterus or conjunctival injection Ears: TM clear bilaterally Nose: mucosa nonerythematous, nonedematous Throat: no pharyngeal exudate or erythema Lymph: no posterior auricular, submental or cervical lymph adenopathy Heart: normal rate, normal sinus rhythm, no murmurs Lungs: clear to auscultation bilaterally, no wheezing   Assessment and Plan Kyliana was seen today for sinusitis.  Diagnoses and all orders for this visit:  Facial pain  Chronic frontal sinusitis  Given her numerous allergies and her heart meds will treat with antibiotic Supportive care handout given -     doxycycline (VIBRA-TABS) 100 MG tablet; Take 1 tablet (100 mg total) by mouth 2 (two) times daily.     [place]

## 2017-03-11 ENCOUNTER — Other Ambulatory Visit: Payer: Self-pay | Admitting: Family Medicine

## 2017-03-11 ENCOUNTER — Other Ambulatory Visit: Payer: Self-pay | Admitting: Physician Assistant

## 2017-03-11 ENCOUNTER — Other Ambulatory Visit (INDEPENDENT_AMBULATORY_CARE_PROVIDER_SITE_OTHER): Payer: Self-pay | Admitting: Orthopedic Surgery

## 2017-03-11 DIAGNOSIS — Z1231 Encounter for screening mammogram for malignant neoplasm of breast: Secondary | ICD-10-CM

## 2017-03-14 ENCOUNTER — Ambulatory Visit
Admission: RE | Admit: 2017-03-14 | Discharge: 2017-03-14 | Disposition: A | Discharge status: Home / Self Care | Payer: BLUE CROSS/BLUE SHIELD | Source: Ambulatory Visit | Attending: Physician Assistant | Admitting: Physician Assistant

## 2017-03-14 DIAGNOSIS — Z1231 Encounter for screening mammogram for malignant neoplasm of breast: Secondary | ICD-10-CM

## 2017-03-15 NOTE — Telephone Encounter (Signed)
Patient has been seeing you, but you have not filled these before. OK to send or does patient need appointment?

## 2017-03-17 ENCOUNTER — Ambulatory Visit (INDEPENDENT_AMBULATORY_CARE_PROVIDER_SITE_OTHER): Payer: BLUE CROSS/BLUE SHIELD | Admitting: Podiatry

## 2017-03-17 DIAGNOSIS — E1149 Type 2 diabetes mellitus with other diabetic neurological complication: Secondary | ICD-10-CM

## 2017-03-17 DIAGNOSIS — S91302A Unspecified open wound, left foot, initial encounter: Secondary | ICD-10-CM

## 2017-03-17 MED ORDER — DOXYCYCLINE HYCLATE 100 MG PO TABS: 100.0000 mg | ORAL_TABLET | Freq: Two times a day (BID) | ORAL | 0 refills | Status: DC

## 2017-03-20 NOTE — Progress Notes (Signed)
Subjective:    Patient ID: Judith [name], female   DOB: 66 y.o.   MRN: [id number]   HPI 66 year old female presents the office today for concerns of a "split" to the left big toe which is been ongoing for about 1 month.  She states that she previously had this and Silvadene on the area which helped some.  She denies any drainage or pus coming from the area.  She is concerned about this because this is how the right foot started before she ended up with a transmetatarsal amputation last year.  She denies any recent injury or trauma.  She has no other concerns today.   Review of Systems  All other systems reviewed and are negative.  Past Medical History:  Diagnosis Date  . Abnormal EKG 07/31/2013  . Arthritis    "hands" (03/06/2015)  . Asthma   . Carpal tunnel syndrome, bilateral   . Chronic kidney disease   . Complication of anesthesia    slow to wake up  . Coronary artery disease     2 v CAD with CTO of the RCA and high grade bifurcational LCx/OM stenosis. S/P PCI DES x 2 to the LCx/OM.  Marland Kitchen Diabetic peripheral neuropathy ([place]) "since 1996"  . GERD (gastroesophageal reflux disease)   . Goiter   . Headache    migraines prior to menopause  . History of shingles 06/01/2013  . Hyperlipidemia LDL goal <70 10/13/2015  . Hypertension   . Hyperthyroidism   . PAF (paroxysmal atrial fibrillation) ([place]) 04/29/2015   CHADS2VASC score of 4 now on Apixaban  . Pneumonia ~ 1976  . Tremors of nervous system   . Type II diabetes mellitus ([facility])    insulin dependent    Past Surgical History:  Procedure Laterality Date  . ABDOMINAL HYSTERECTOMY  1988   age 50; CERVICAL DYSPLASIA; ovaries intact.   . CARPAL TUNNEL RELEASE Right Nov 2015  . CARPAL TUNNEL RELEASE Right 1992; 05/2014   [name]; [place]  . CESAREAN SECTION  1982; 1984  . FOOT NEUROMA SURGERY Bilateral 2000  . KNEE ARTHROSCOPY Right ~ 2003   "meniscus repair"  . SHOULDER OPEN ROTATOR CUFF REPAIR Right 1996; 1998   "w/fracture  repair"  . THYROID SURGERY  2000   "removed lots of nodules"  . TONSILLECTOMY  1976     Current Outpatient Medications:  .  albuterol (PROVENTIL HFA) 108 (90 Base) MCG/ACT inhaler, Inhale 1-2 puffs into the lungs every 6 (six) hours as needed for wheezing or shortness of breath., Disp: 1 Inhaler, Rfl: 0 .  amLODipine (NORVASC) 5 MG tablet, Take 1 tablet (5 mg total) by mouth daily., Disp: 30 tablet, Rfl: 11 .  apixaban (ELIQUIS) 5 MG TABS tablet, Take 1 tablet (5 mg total) by mouth 2 (two) times daily., Disp: 60 tablet, Rfl: 11 .  atorvastatin (LIPITOR) 20 MG tablet, Take 1 tablet (20 mg total) by mouth daily., Disp: 90 tablet, Rfl: 1 .  buPROPion (WELLBUTRIN SR) 150 MG 12 hr tablet, TAKE ONE TABLET BY MOUTH TWICE DAILY, Disp: 60 tablet, Rfl: 11 .  clopidogrel (PLAVIX) 75 MG tablet, TAKE ONE TABLET BY MOUTH ONCE DAILY (Patient taking differently: TAKE 75 MG BY MOUTH ONCE DAILY), Disp: 90 tablet, Rfl: 3 .  clotrimazole-betamethasone (LOTRISONE) cream, Apply 1 application topically 2 (two) times daily., Disp: 30 g, Rfl: 0 .  cyclobenzaprine (FLEXERIL) 5 MG tablet, Take 1 tablet (5 mg total) by mouth 3 (three) times daily as needed. for muscle spams, Disp:  90 tablet, Rfl: 1 .  doxycycline (VIBRA-TABS) 100 MG tablet, Take 1 tablet (100 mg total) by mouth 2 (two) times daily., Disp: 20 tablet, Rfl: 0 .  doxycycline (VIBRA-TABS) 100 MG tablet, Take 1 tablet (100 mg total) 2 (two) times daily by mouth., Disp: 20 tablet, Rfl: 0 .  fenofibrate (TRICOR) 145 MG tablet, Take 1 tablet (145 mg total) by mouth daily., Disp: 90 tablet, Rfl: 3 .  fluconazole (DIFLUCAN) 100 MG tablet, Take Diflucan 100 mg PO daily for 10 days, Disp: 10 tablet, Rfl: 0 .  FLUoxetine (PROZAC) 20 MG capsule, TAKE ONE CAPSULE BY MOUTH AT BEDTIME, Disp: 30 capsule, Rfl: 11 .  fluticasone (FLOVENT HFA) 44 MCG/ACT inhaler, Inhale 2 puffs into the lungs 2 (two) times daily as needed (shortness of breath/ wheezing). , Disp: , Rfl:  .   gabapentin (NEURONTIN) 300 MG capsule, Take 3 capsules (900 mg total) by mouth 2 (two) times daily. TAKE 3 CAPSULES BY MOUTH EVERY  MORNING AND 3 CAPSULES AT BEDTIME, Disp: 540 capsule, Rfl: 3 .  insulin aspart (NOVOLOG) 100 UNIT/ML injection, Inject 15 units at breakfast 25 units at lunch and 10 units at dinner, Disp: 2 vial, Rfl: 3 .  insulin degludec (TRESIBA FLEXTOUCH) 100 UNIT/ML SOPN FlexTouch Pen, Inject 0.2 mLs (20 Units total) into the skin at bedtime., Disp: 5 pen, Rfl: 11 .  Insulin Pen Needle 32G X 4 MM MISC, Use to inject insulin daily, Disp: 100 each, Rfl: 5 .  insulin regular (NOVOLIN R RELION) 100 units/mL injection, Inject 15-25-10 units three times daily just before each meal., Disp: 10 mL, Rfl: 11 .  Insulin Syringe-Needle U-100 (INSULIN SYRINGE .5CC/31GX5/16") 31G X 5/16" 0.5 ML MISC, Use to inject insulin, Disp: 100 each, Rfl: 5 .  losartan (COZAAR) 50 MG tablet, TAKE ONE TABLET BY MOUTH ONCE DAILY (Patient taking differently: TAKE 50 MG BY MOUTH ONCE DAILY), Disp: 90 tablet, Rfl: 3 .  metFORMIN (GLUCOPHAGE-XR) 500 MG 24 hr tablet, Take 4 tablets (2,000 mg total) by mouth daily with breakfast. (Patient taking differently: Take 1,000 mg by mouth 2 (two) times daily. ), Disp: 360 tablet, Rfl: 3 .  methimazole (TAPAZOLE) 10 MG tablet, Take 1 tablet (10 mg total) by mouth daily., Disp: 30 tablet, Rfl: 11 .  metoprolol succinate (TOPROL-XL) 25 MG 24 hr tablet, TAKE ONE TABLET BY MOUTH TWICE DAILY, Disp: 180 tablet, Rfl: 2 .  nitroGLYCERIN (NITRO-DUR) 0.8 MG/HR, Place 1 patch onto the skin daily., Disp: , Rfl:  .  nitroGLYCERIN (NITROSTAT) 0.4 MG SL tablet, Place 1 tablet (0.4 mg total) under the tongue every 5 (five) minutes as needed for chest pain., Disp: 25 tablet, Rfl: 3 .  ondansetron (ZOFRAN) 4 MG tablet, Take 1 tablet (4 mg total) by mouth every 8 (eight) hours as needed for nausea or vomiting., Disp: 90 tablet, Rfl: 1 .  pantoprazole (PROTONIX) 40 MG tablet, Take 1 tablet (40 mg  total) by mouth daily., Disp: 90 tablet, Rfl: 3 .  predniSONE (DELTASONE) 10 MG tablet, Take 3 tablets (30 mg total) by mouth daily with breakfast., Disp: 15 tablet, Rfl: 0 .  sotalol (BETAPACE) 80 MG tablet, TAKE ONE TABLET BY MOUTH EVERY 12 HOURS, Disp: 180 tablet, Rfl: 2 .  SSD 1 % cream, APPLY TO AFFECTED AREA ONCE DAILY, Disp: 50 g, Rfl: 0 .  traMADol (ULTRAM) 50 MG tablet, Take 1 tablet (50 mg total) by mouth every 6 (six) hours as needed for moderate pain or severe pain., Disp:  360 tablet, Rfl: 1  Allergies  Allergen Reactions  . Dilaudid [Hydromorphone Hcl] Other (See Comments)    HEADACHE  . Novolog [Insulin Aspart] Shortness Of Breath    "breathing problems"  . Codeine Nausea And Vomiting    HIGH DOSES-SEVERE VOMITING  . Iodine Other (See Comments)    MUST HAVE BENADRYL PRIOR TO PROCEDURE AND RIGHT BEFORE TREATMENT TO COUNTERACT REACTION-BLISTERING REACTION DERMATOLOGICAL  . Penicillins Itching, Rash and Other (See Comments)    Has patient had a PCN reaction causing immediate rash, facial/tongue/throat swelling, SOB or lightheadedness with hypotension: Yes Has patient had a PCN reaction causing severe rash involving mucus membranes or skin necrosis: No Has patient had a PCN reaction that required hospitalization No Has patient had a PCN reaction occurring within the last 10 years: No If all of the above answers are "NO", then may proceed with Cephalosporin use.  CHEST SIZED RASH AND ITCHING   . Propofol Other (See Comments)    "Breathing problems - asthma attack"  . Ace Inhibitors Cough  . Demerol [Meperidine] Nausea And Vomiting  . Neosporin [Neomycin-Bacitracin Zn-Polymyx] Itching and Rash    MAKES REACTIONS WORSE WHEN USING AS PROPHYLACTIC  . Percocet [Oxycodone-Acetaminophen] Rash  . Tape Itching and Rash    Social History   Socioeconomic History  . Marital status: Divorced    Spouse name: Not on file  . Number of children: 2  . Years of education: Masters  .  Highest education level: Not on file  Social Needs  . Financial resource strain: Not on file  . Food insecurity - worry: Not on file  . Food insecurity - inability: Not on file  . Transportation needs - medical: Not on file  . Transportation needs - non-medical: Not on file  Occupational History  . Occupation: Landscape architect  Tobacco Use  . Smoking status: Former Smoker    Packs/day: 0.00    Years: 41.00    Pack years: 0.00    Types: Cigarettes    Last attempt to quit: 03/05/2015    Years since quitting: 2.0  . Smokeless tobacco: Never Used  . Tobacco comment: 04/29/2015 "quit smoking cigarettes 02/27/2015"  Substance and Sexual Activity  . Alcohol use: No  . Drug use: No  . Sexual activity: Not Currently    Birth control/protection: Post-menopausal, Surgical  Other Topics Concern  . 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      Lives with sister in [place].      Tobacco: 1 ppd x 41 years - quit 2016      Alcohol: none      Drugs: none      Exercise:  Walking in neighborhood; physical job.   Right-handed.   2 cups caffeine daily.          Objective:  Physical Exam  General: AAO x3, NAD  Dermatological: The distal aspect of the left hallux just along the distal medial portion of the nail appears to be superficial skin fissure with hyperkeratotic tissue along the area.  See pictures below.  There is no drainage or pus.  There is faint surrounding erythema but there is no significant increase in warmth.  There is no ascending cellulitis.  There is no fluctuation  or crepitation.  There is no other open lesions or pre-ulcerative lesions.  The incision from the right transmetatarsal amputation is healed.  Vascular: Dorsalis Pedis artery and  Posterior Tibial artery pedal pulses are 2/4 on the left and 1/4 on the right with immedate capillary fill time bilaterally. P There is no pain with calf compression, swelling, warmth, erythema.   Neruologic: Sensation decreased with since Wednesday monofilament  Musculoskeletal: Transmetatarsal amputation right foot.           Assessment:     Superficial skin fissure, wound left hallux    Plan:     -Treatment options discussed including all alternatives, risks, and complications -Etiology of symptoms were discussed -I sharply debrided the hyperkeratotic tissue along the area without any complications or bleeding today.  Discussed antibiotic ointment which she wants to continue with Silvadene and I agree with that.  I prescribed doxycycline for her today.  Offloading at all times. Monitor for any clinical signs or symptoms of infection and directed to call the office immediately should any occur or go to the ER. -Follow-up in 10 days or sooner if needed.  Call any questions or concerns.  Trula Slade DPM

## 2017-03-25 ENCOUNTER — Other Ambulatory Visit: Payer: Self-pay

## 2017-03-25 ENCOUNTER — Telehealth: Payer: Self-pay | Admitting: *Deleted

## 2017-03-25 MED ORDER — METFORMIN HCL ER 500 MG PO TB24: 2000.0000 mg | ORAL_TABLET | Freq: Every day | ORAL | 3 refills | Status: DC

## 2017-03-25 NOTE — Telephone Encounter (Signed)
Patient called and states she needs a refill of her Metformin. Patient pharmacy is [facility]. Please advise. Thank you

## 2017-03-25 NOTE — Telephone Encounter (Signed)
Done

## 2017-03-28 ENCOUNTER — Ambulatory Visit (INDEPENDENT_AMBULATORY_CARE_PROVIDER_SITE_OTHER): Payer: BLUE CROSS/BLUE SHIELD | Admitting: Podiatry

## 2017-03-28 DIAGNOSIS — S91302A Unspecified open wound, left foot, initial encounter: Secondary | ICD-10-CM

## 2017-03-28 NOTE — Progress Notes (Signed)
Subjective: Judith [name] presents the office today for follow-up evaluation of a skin fissure, wound to the left great toe.  She states that she is doing well.  She denies any drainage or pus coming from the area denies any increase in swelling.  She has no new concerns. Denies any systemic complaints such as fevers, chills, nausea, vomiting. No acute changes since last appointment, and no other complaints at this time.   Objective: AAO x3, NAD DP/PT pulses palpable bilaterally, CRT less than 3 seconds On the distal aspect today left hallux continues to be a small skin fissure, hyperkeratotic lesion.  Upon debridement the area is more superficial and healing in well.  There is no surrounding erythema, drainage or pus or any ascending cellulitis.  There is no clinical signs of infection present. No open lesions or pre-ulcerative lesions.  No pain with calf compression, swelling, warmth, erythema  Assessment: Healing wound left hallux  Plan: -All treatment options discussed with the patient including all alternatives, risks, complications.  -She will be debride hyperkeratotic tissue to the left hallux without any complications.  Continue with Silvadene dressing changes daily. Offloading.  -Monitor for any clinical signs or symptoms of infection and directed to call the office immediately should any occur or go to the ER. -RTC 2 weeks -Patient encouraged to call the office with any questions, concerns, change in symptoms.   Trula Slade DPM

## 2017-04-04 ENCOUNTER — Ambulatory Visit: Payer: BLUE CROSS/BLUE SHIELD | Admitting: Physician Assistant

## 2017-04-07 ENCOUNTER — Encounter: Payer: Self-pay | Admitting: Podiatry

## 2017-04-07 ENCOUNTER — Ambulatory Visit (INDEPENDENT_AMBULATORY_CARE_PROVIDER_SITE_OTHER): Payer: BLUE CROSS/BLUE SHIELD | Admitting: Podiatry

## 2017-04-07 DIAGNOSIS — S91302A Unspecified open wound, left foot, initial encounter: Secondary | ICD-10-CM | POA: Diagnosis not present

## 2017-04-07 MED ORDER — SULFAMETHOXAZOLE-TRIMETHOPRIM 800-160 MG PO TABS: 1.0000 | ORAL_TABLET | Freq: Two times a day (BID) | ORAL | 1 refills | Status: DC

## 2017-04-08 NOTE — Progress Notes (Signed)
Subjective: 66 year old female presents the office today for follow-up evaluation of a wound to the left big toe.  She is concerned because she started some abnormal sensations of the toe and she had there is a small amount of clear bloody drainage but denies any pus.  Denies any increase in redness or swelling to her feet.  She states that she could not see the wounds that she is not sure how it is doing.  She denies any acute changes since last appointment otherwise.  She is wearing a regular shoe. Denies any systemic complaints such as fevers, chills, nausea, vomiting. No acute changes since last appointment, and no other complaints at this time.   Objective: AAO x3, NAD DP/PT pulses palpable bilaterally, CRT less than 3 seconds Sensation decreased with Simms Weinstein monofilament To the distal medial aspect of the left hallux just distal to the nailbed is a skin fissure with underlying granular wound.  Able to express a small amount of clear drainage but no pus.  There is mild edema and erythema to the distal aspect of the toe but there is no ascending cellulitis.  There is no fluctuance or crepitus.  There is no malodor. No open lesions or pre-ulcerative lesions.  No pain with calf compression, swelling, warmth, erythema      Assessment: Ulceration left hallux with mild worsening  Plan: -All treatment options discussed with the patient including all alternatives, risks, complications.  -Wound was debrided, see below. -Recommend open toed surgical shoe which she has at home. -Continue Silvadene dressing changes daily. -Monitor for any clinical signs or symptoms of infection and directed to call the office immediately should any occur or go to the ER.  Procedure: Wound debridement Verbal consent was obtained Location: left hallux Anesthesia: none Instrumentation: 312 blade Level of debridement: Superficial, not down to the adipose tissue. Dressing: Silvadene and a dry  dressing Disposition: Patient tolerated procedure well. Patient to return in 1 week for follow-up.  -Patient encouraged to call the office with any questions, concerns, change in symptoms.    Trula Slade DPM

## 2017-04-11 ENCOUNTER — Ambulatory Visit: Payer: BLUE CROSS/BLUE SHIELD | Admitting: Physician Assistant

## 2017-04-11 ENCOUNTER — Ambulatory Visit (INDEPENDENT_AMBULATORY_CARE_PROVIDER_SITE_OTHER): Payer: BLUE CROSS/BLUE SHIELD | Admitting: Podiatry

## 2017-04-11 ENCOUNTER — Encounter: Payer: Self-pay | Admitting: Podiatry

## 2017-04-11 DIAGNOSIS — S91302A Unspecified open wound, left foot, initial encounter: Secondary | ICD-10-CM

## 2017-04-11 DIAGNOSIS — E1149 Type 2 diabetes mellitus with other diabetic neurological complication: Secondary | ICD-10-CM

## 2017-04-13 NOTE — Progress Notes (Signed)
Subjective: 66 year old female presents the office today for follow-up evaluation of a wound to the left big toe.  She states that she has been taking the antibiotics and the toe is doing "so much better".  She denies any drainage or pus and she says the redness has resolved and she denies any red streaks.  Overall she is doing much better she has no new concerns.  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 Denies any systemic complaints such as fevers, chills, nausea, vomiting. No acute changes since last appointment, and no other complaints at this time.   Objective: AAO x3, NAD DP/PT pulses palpable bilaterally, CRT less than 3 seconds Sensation decreased with Simms Weinstein monofilament To the distal medial aspect of the left hallux just distal to the nailbed is a skin fissure with underlying granular wound.   Minimal edema there is no significant erythema or increase in warmth.  There is no drainage or pus today.  There is no ascending cellulitis.  There is no fluctuation or crepitation.  There is no probing, undermining or tunneling.  Overall the wound appears to be doing much better. No open lesions or pre-ulcerative lesions.  No pain with calf compression, swelling, warmth, erythema        Assessment: Ulceration left hallux which is improved  Plan: -All treatment options discussed with the patient including all alternatives, risks, complications.  -Wound was lightly debrided today with a small amount of hyperkeratotic tissue.  There is no drainage or pus today.  Finished a course of antibiotics.  Recommended continue with a small amount of Silvadene to the wound daily.  Offloading at all times.  New surgical shoe was dispensed for her today. -Follow-up as scheduled or sooner if needed.  Call any questions or concerns meantime and she agrees this plan. -Monitor for any clinical signs or symptoms  of infection and directed to call the office immediately should any occur or go to the ER. -Patient encouraged to call the office with any questions, concerns, change in symptoms.   Trula Slade DPM

## 2017-04-14 ENCOUNTER — Ambulatory Visit: Payer: BLUE CROSS/BLUE SHIELD | Admitting: Physician Assistant

## 2017-05-11 ENCOUNTER — Other Ambulatory Visit: Payer: Self-pay | Admitting: Physician Assistant

## 2017-05-11 ENCOUNTER — Other Ambulatory Visit: Payer: Self-pay | Admitting: Endocrinology

## 2017-05-11 NOTE — Telephone Encounter (Signed)
Tramadol refill. Last refill on 09/28/16, 360 tabs dispensed with 1 additional refill.

## 2017-05-12 ENCOUNTER — Ambulatory Visit (INDEPENDENT_AMBULATORY_CARE_PROVIDER_SITE_OTHER): Payer: BLUE CROSS/BLUE SHIELD | Admitting: Podiatry

## 2017-05-12 ENCOUNTER — Other Ambulatory Visit: Payer: Self-pay | Admitting: Cardiology

## 2017-05-12 ENCOUNTER — Other Ambulatory Visit: Payer: Self-pay | Admitting: Physician Assistant

## 2017-05-12 DIAGNOSIS — S91302A Unspecified open wound, left foot, initial encounter: Secondary | ICD-10-CM

## 2017-05-12 DIAGNOSIS — E1149 Type 2 diabetes mellitus with other diabetic neurological complication: Secondary | ICD-10-CM

## 2017-05-12 NOTE — Telephone Encounter (Signed)
Pt last saw Dr Radford Pax 09/23/16, last labs 09/24/16 Creat 0.66, age 67, weight 80.8kg, based on specified criteria pt is on appropriate dosage of Eliquis 5mg  BID.  Will refill rx.

## 2017-05-12 NOTE — Telephone Encounter (Signed)
Please advise 

## 2017-05-15 NOTE — Progress Notes (Signed)
Subjective: 67 year old female presents the office today for follow-up evaluation of a wound to the left big toe.  She states that she is doing very well and the toe is looking much better.  She denies any redness or drainage or any swelling.  She has been putting small amounts of Silvadene to the area daily.  She has finished antibiotics.  Overall she is pleased with the look of the toe and she has no other concerns today. Denies any systemic complaints such as fevers, chills, nausea, vomiting. No acute changes since last appointment, and no other complaints at this time.   Objective: AAO x3, NAD DP/PT pulses palpable bilaterally, CRT less than 3 seconds Sensation decreased with Simms Weinstein monofilament To the distal medial aspect of the left hallux just distal to the nailbed is a skin fissure in the nail appears to be dystrophic, discolored and there appears to be some thick skin on the toenail.  I was able to sharply debride this today, see below, and the wound appears to be healed.  There is no drainage or pus.  There is no surrounding erythema, ascending sialitis.  There is no fluctuance or crepitus.  There is no malodor. No open lesions or pre-ulcerative lesions.  No pain with calf compression, swelling, warmth, erythema  After debridement:    Before debridement:       Assessment: Ulceration left hallux which has healed  Plan: -All treatment options discussed with the patient including all alternatives, risks, complications.  -After verbal consent was obtained I sharply debrided the hyperkeratotic tissue 312 blade scalpel as well as debrided the toenail.  It appears that the wound is healed today there is no signs of infection noted.  I want him to continue with a small amount of moisturizer to the area daily.  Continue offloading at all times but she can start to transition to regular shoe.  Monitor for any recurrence. -I will see her back next couple weeks to ensure healing or  sooner if needed.  Call any questions or concerns and she agrees this plan has no further questions.  Trula Slade DPM

## 2017-05-26 ENCOUNTER — Encounter: Payer: Self-pay | Admitting: Podiatry

## 2017-05-26 ENCOUNTER — Ambulatory Visit (INDEPENDENT_AMBULATORY_CARE_PROVIDER_SITE_OTHER): Payer: BLUE CROSS/BLUE SHIELD | Admitting: Podiatry

## 2017-05-26 DIAGNOSIS — E1149 Type 2 diabetes mellitus with other diabetic neurological complication: Secondary | ICD-10-CM | POA: Diagnosis not present

## 2017-05-26 DIAGNOSIS — S91302A Unspecified open wound, left foot, initial encounter: Secondary | ICD-10-CM | POA: Diagnosis not present

## 2017-05-26 DIAGNOSIS — L84 Corns and callosities: Secondary | ICD-10-CM | POA: Diagnosis not present

## 2017-05-29 NOTE — Progress Notes (Signed)
Subjective: 67 year old female presents the office today for follow-up evaluation of a wound to the left big toe.  She states the toe is doing much better.  She denies any increase in swelling or redness and she denies any drainage or pus.  She is returned to regular shoe.  She states she is a very happy with the look of the toe.  She also states that she was not thinking that the toe was may be salvageable but she is very happy that she still has her toe. Denies any systemic complaints such as fevers, chills, nausea, vomiting. No acute changes since last appointment, and no other complaints at this time.   Objective: AAO x3, NAD DP/PT pulses palpable bilaterally, CRT less than 3 seconds Sensation decreased with Simms Weinstein monofilament To the distal aspect left hallux is hyperkeratotic lesion with skin fissure.  Upon debridement it appears that the wound is healed and there is no skin breakdown however it is pre-ulcerative.  There is no drainage or pus.  No increase in warmth.  Minimal edema.  No other open lesions or pre-ulcerative lesions identified today. No open lesions or pre-ulcerative lesions.  No pain with calf compression, swelling, warmth, erythema  Before debridement:       After debridement:        Assessment: Ulceration left hallux which has healed  Plan: -All treatment options discussed with the patient including all alternatives, risks, complications.  -After verbal consent was obtained I sharply debrided the hyperkeratotic tissue to the left hallux today without any complications or bleeding.  There is no signs of infection appears the wound is healed.  I want her to use a small amount of moisturizer to the area daily but do not applied interdigitally.  Offloading at all times.  Continue with regular shoe. -Follow-up as scheduled for routine care or sooner if needed.  Call any questions or concerns in the meantime.  She agrees this plan is very  thankful.  Trula Slade DPM

## 2017-06-02 ENCOUNTER — Ambulatory Visit (INDEPENDENT_AMBULATORY_CARE_PROVIDER_SITE_OTHER): Payer: BLUE CROSS/BLUE SHIELD | Admitting: Urgent Care

## 2017-06-02 ENCOUNTER — Encounter: Payer: Self-pay | Admitting: Urgent Care

## 2017-06-02 VITALS — BP 150/84 | HR 78 | Temp 98.1°F | Resp 18 | Ht 63.0 in | Wt 179.4 lb

## 2017-06-02 DIAGNOSIS — Z111 Encounter for screening for respiratory tuberculosis: Secondary | ICD-10-CM

## 2017-06-02 DIAGNOSIS — L989 Disorder of the skin and subcutaneous tissue, unspecified: Secondary | ICD-10-CM

## 2017-06-02 DIAGNOSIS — Z021 Encounter for pre-employment examination: Secondary | ICD-10-CM | POA: Diagnosis not present

## 2017-06-02 NOTE — Progress Notes (Signed)
  MRN: [id number]  Subjective:   [name] is a 67 y.o. female presenting for pre-employment physical for [employer] [facility] [employer] [facility]. Will pursue Quantiferon test for tuberculosis screening. She would like a referral to a dermatologist for a mole that she has in her right groin. Prefers not to be examined by me or PA-Clark.   Judith [name] has a current medication list which includes the following prescription(s): albuterol, amlodipine, atorvastatin, bupropion, clopidogrel, clotrimazole-betamethasone, cyclobenzaprine, doxycycline, doxycycline, eliquis, fenofibrate, fluconazole, fluoxetine, fluticasone, gabapentin, insulin aspart, insulin degludec, insulin pen needle, insulin regular, insulin syringe .5cc/31gx5/16", losartan, metformin, methimazole, metoprolol succinate, nitroglycerin, nitroglycerin, ondansetron, pantoprazole, prednisone, sotalol, ssd, sulfamethoxazole-trimethoprim, and tramadol. She is allergic to dilaudid [hydromorphone hcl]; novolog [insulin aspart]; codeine; iodine; penicillins; propofol; ace inhibitors; demerol [meperidine]; neosporin [neomycin-bacitracin zn-polymyx]; percocet [oxycodone-acetaminophen]; and tape. Judith [name]  has a past medical history of Abnormal EKG (07/31/2013), Arthritis, Asthma, Carpal tunnel syndrome, bilateral, Chronic kidney disease, Complication of anesthesia, Coronary artery disease, Diabetic peripheral neuropathy ([place]) ("since 1996"), GERD (gastroesophageal reflux disease), Goiter, Headache, History of shingles (06/01/2013), Hyperlipidemia LDL goal <70 (10/13/2015), Hypertension, Hyperthyroidism, PAF (paroxysmal atrial fibrillation) ([place]) (04/29/2015), Pneumonia (~ 1976), Tremors of nervous system, and Type II diabetes mellitus ([place]). 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 Her family history includes Allergies in her sister; Breast cancer in her maternal grandmother and mother; COPD in her father; Cancer (age of onset: 65) in her mother; Emphysema in her maternal grandfather and paternal grandfather; Heart attack in her father; Heart disease (age of onset: 55) in her father; Hypertension in her father; Leukemia in her paternal grandmother; Lung cancer in her mother; Parkinson's disease in her father.  Immunizations: Up to date with influenza, tdap, Prevnar 13, Pneumo 23.  Objective:   Vitals: BP (!) 150/84   Pulse 78   Temp 98.1 F (36.7 C) (Oral)   Resp 18   Ht 5\' 3"  (1.6 m)   Wt 179 lb 6.4 oz (81.4 kg)   SpO2 97%   BMI 31.78 kg/m   Physical Exam  Constitutional: She is oriented to person, place, and time. She appears well-developed and well-nourished.  HENT:  Mouth/Throat: Oropharynx is clear and moist.  TM's intact.  Eyes: EOM are normal. Pupils are equal, round, and reactive to light.  Cardiovascular: Normal rate, regular rhythm and intact distal pulses. Exam reveals no gallop and no friction rub.  No murmur heard. Pulmonary/Chest: No respiratory distress. She has no wheezes. She has no rales.  Neurological: She is alert and oriented to person, place, and time.  Skin: Skin is warm and dry.  Psychiatric: She has a normal mood and affect.   Assessment and Plan :   Pre-employment examination  Screening for tuberculosis - Plan: QuantiFERON-TB Gold Plus  Skin lesion - Plan: Ambulatory referral to Dermatology  Quantiferon is pending. Otherwise, patient is cleared for participate as a Pharmacist, hospital in [facility]. Once quantiferon results, will notify patient. Referral  to dermatology pending. Patient prefers not to be examined by a female provider for her groin mole.  Judith Eagles, PA-C [facility] [phone] 06/02/2017  11:39 AM

## 2017-06-02 NOTE — Patient Instructions (Addendum)
Health Maintenance, Female Adopting a healthy lifestyle and getting preventive care can go a long way to promote health and wellness. Talk with your health care provider about what schedule of regular examinations is right for you. This is a good chance for you to check in with your provider about disease prevention and staying healthy. In between checkups, there are plenty of things you can do on your own. Experts have done a lot of research about which lifestyle changes and preventive measures are most likely to keep you healthy. Ask your health care provider for more information. Weight and diet Eat a healthy diet  Be sure to include plenty of vegetables, fruits, low-fat dairy products, and lean protein.  Do not eat a lot of foods high in solid fats, added sugars, or salt.  Get regular exercise. This is one of the most important things you can do for your health. ? Most adults should exercise for at least 150 minutes each week. The exercise should increase your heart rate and make you sweat (moderate-intensity exercise). ? Most adults should also do strengthening exercises at least twice a week. This is in addition to the moderate-intensity exercise.  Maintain a healthy weight  Body mass index (BMI) is a measurement that can be used to identify possible weight problems. It estimates body fat based on height and weight. Your health care provider can help determine your BMI and help you achieve or maintain a healthy weight.  For females 20 years of age and older: ? A BMI below 18.5 is considered underweight. ? A BMI of 18.5 to 24.9 is normal. ? A BMI of 25 to 29.9 is considered overweight. ? A BMI of 30 and above is considered obese.  Watch levels of cholesterol and blood lipids  You should start having your blood tested for lipids and cholesterol at 65 years of age, then have this test every 5 years.  You may need to have your cholesterol levels checked more often if: ? Your lipid or  cholesterol levels are high. ? You are older than 65 years of age. ? You are at high risk for heart disease.  Cancer screening Lung Cancer  Lung cancer screening is recommended for adults 55-80 years old who are at high risk for lung cancer because of a history of smoking.  A yearly low-dose CT scan of the lungs is recommended for people who: ? Currently smoke. ? Have quit within the past 15 years. ? Have at least a 30-pack-year history of smoking. A pack year is smoking an average of one pack of cigarettes a day for 1 year.  Yearly screening should continue until it has been 15 years since you quit.  Yearly screening should stop if you develop a health problem that would prevent you from having lung cancer treatment.  Breast Cancer  Practice breast self-awareness. This means understanding how your breasts normally appear and feel.  It also means doing regular breast self-exams. Let your health care provider know about any changes, no matter how small.  If you are in your 20s or 30s, you should have a clinical breast exam (CBE) by a health care provider every 1-3 years as part of a regular health exam.  If you are 40 or older, have a CBE every year. Also consider having a breast X-ray (mammogram) every year.  If you have a family history of breast cancer, talk to your health care provider about genetic screening.  If you are at high risk   for breast cancer, talk to your health care provider about having an MRI and a mammogram every year.  Breast cancer gene (BRCA) assessment is recommended for women who have family members with BRCA-related cancers. BRCA-related cancers include: ? Breast. ? Ovarian. ? Tubal. ? Peritoneal cancers.  Results of the assessment will determine the need for genetic counseling and BRCA1 and BRCA2 testing.  Cervical Cancer Your health care provider may recommend that you be screened regularly for cancer of the pelvic organs (ovaries, uterus, and  vagina). This screening involves a pelvic examination, including checking for microscopic changes to the surface of your cervix (Pap test). You may be encouraged to have this screening done every 3 years, beginning at age 22.  For women ages 56-65, health care providers may recommend pelvic exams and Pap testing every 3 years, or they may recommend the Pap and pelvic exam, combined with testing for human papilloma virus (HPV), every 5 years. Some types of HPV increase your risk of cervical cancer. Testing for HPV may also be done on women of any age with unclear Pap test results.  Other health care providers may not recommend any screening for nonpregnant women who are considered low risk for pelvic cancer and who do not have symptoms. Ask your health care provider if a screening pelvic exam is right for you.  If you have had past treatment for cervical cancer or a condition that could lead to cancer, you need Pap tests and screening for cancer for at least 20 years after your treatment. If Pap tests have been discontinued, your risk factors (such as having a new sexual partner) need to be reassessed to determine if screening should resume. Some women have medical problems that increase the chance of getting cervical cancer. In these cases, your health care provider may recommend more frequent screening and Pap tests.  Colorectal Cancer  This type of cancer can be detected and often prevented.  Routine colorectal cancer screening usually begins at 65 years of age and continues through 65 years of age.  Your health care provider may recommend screening at an earlier age if you have risk factors for colon cancer.  Your health care provider may also recommend using home test kits to check for hidden blood in the stool.  A small camera at the end of a tube can be used to examine your colon directly (sigmoidoscopy or colonoscopy). This is done to check for the earliest forms of colorectal  cancer.  Routine screening usually begins at age 33.  Direct examination of the colon should be repeated every 5-10 years through 65 years of age. However, you may need to be screened more often if early forms of precancerous polyps or small growths are found.  Skin Cancer  Check your skin from head to toe regularly.  Tell your health care provider about any new moles or changes in moles, especially if there is a change in a mole's shape or color.  Also tell your health care provider if you have a mole that is larger than the size of a pencil eraser.  Always use sunscreen. Apply sunscreen liberally and repeatedly throughout the day.  Protect yourself by wearing long sleeves, pants, a wide-brimmed hat, and sunglasses whenever you are outside.  Heart disease, diabetes, and high blood pressure  High blood pressure causes heart disease and increases the risk of stroke. High blood pressure is more likely to develop in: ? People who have blood pressure in the high end of  the normal range (130-139/85-89 mm Hg). ? People who are overweight or obese. ? People who are African American.  If you are 21-29 years of age, have your blood pressure checked every 3-5 years. If you are 3 years of age or older, have your blood pressure checked every year. You should have your blood pressure measured twice-once when you are at a hospital or clinic, and once when you are not at a hospital or clinic. Record the average of the two measurements. To check your blood pressure when you are not at a hospital or clinic, you can use: ? An automated blood pressure machine at a pharmacy. ? A home blood pressure monitor.  If you are between 17 years and 37 years old, ask your health care provider if you should take aspirin to prevent strokes.  Have regular diabetes screenings. This involves taking a blood sample to check your fasting blood sugar level. ? If you are at a normal weight and have a low risk for diabetes,  have this test once every three years after 65 years of age. ? If you are overweight and have a high risk for diabetes, consider being tested at a younger age or more often. Preventing infection Hepatitis B  If you have a higher risk for hepatitis B, you should be screened for this virus. You are considered at high risk for hepatitis B if: ? You were born in a country where hepatitis B is common. Ask your health care provider which countries are considered high risk. ? Your parents were born in a high-risk country, and you have not been immunized against hepatitis B (hepatitis B vaccine). ? You have HIV or AIDS. ? You use needles to inject street drugs. ? You live with someone who has hepatitis B. ? You have had sex with someone who has hepatitis B. ? You get hemodialysis treatment. ? You take certain medicines for conditions, including cancer, organ transplantation, and autoimmune conditions.  Hepatitis C  Blood testing is recommended for: ? Everyone born from [birth year] through 1963. ? Anyone with known risk factors for hepatitis C.  Sexually transmitted infections (STIs)  You should be screened for sexually transmitted infections (STIs) including gonorrhea and chlamydia if: ? You are sexually active and are younger than 65 years of age. ? You are older than 65 years of age and your health care provider tells you that you are at risk for this type of infection. ? Your sexual activity has changed since you were last screened and you are at an increased risk for chlamydia or gonorrhea. Ask your health care provider if you are at risk.  If you do not have HIV, but are at risk, it may be recommended that you take a prescription medicine daily to prevent HIV infection. This is called pre-exposure prophylaxis (PrEP). You are considered at risk if: ? You are sexually active and do not regularly use condoms or know the HIV status of your partner(s). ? You take drugs by injection. ? You are  sexually active with a partner who has HIV.  Talk with your health care provider about whether you are at high risk of being infected with HIV. If you choose to begin PrEP, you should first be tested for HIV. You should then be tested every 3 months for as long as you are taking PrEP. Pregnancy  If you are premenopausal and you may become pregnant, ask your health care provider about preconception counseling.  If you may become  pregnant, take 400 to 800 micrograms (mcg) of folic acid every day.  If you want to prevent pregnancy, talk to your health care provider about birth control (contraception). Osteoporosis and menopause  Osteoporosis is a disease in which the bones lose minerals and strength with aging. This can result in serious bone fractures. Your risk for osteoporosis can be identified using a bone density scan.  If you are 36 years of age or older, or if you are at risk for osteoporosis and fractures, ask your health care provider if you should be screened.  Ask your health care provider whether you should take a calcium or vitamin D supplement to lower your risk for osteoporosis.  Menopause may have certain physical symptoms and risks.  Hormone replacement therapy may reduce some of these symptoms and risks. Talk to your health care provider about whether hormone replacement therapy is right for you. Follow these instructions at home:  Schedule regular health, dental, and eye exams.  Stay current with your immunizations.  Do not use any tobacco products including cigarettes, chewing tobacco, or electronic cigarettes.  If you are pregnant, do not drink alcohol.  If you are breastfeeding, limit how much and how often you drink alcohol.  Limit alcohol intake to no more than 1 drink per day for nonpregnant women. One drink equals 12 ounces of beer, 5 ounces of wine, or 1 ounces of hard liquor.  Do not use street drugs.  Do not share needles.  Ask your health care  provider for help if you need support or information about quitting drugs.  Tell your health care provider if you often feel depressed.  Tell your health care provider if you have ever been abused or do not feel safe at home. This information is not intended to replace advice given to you by your health care provider. Make sure you discuss any questions you have with your health care provider. Document Released: 11/09/2010 Document Revised: 10/02/2015 Document Reviewed: 01/28/2015 Elsevier Interactive Patient Education  2018 [place]    Mole A mole is a colored (pigmented) growth on the skin. Moles are very common. They are usually harmless, but some moles can become cancerous over time. What are the causes? Moles occur when pigmented skin cells grow together in clusters instead of spreading out in the skin as they normally do. The reason why the skin cells grow together in clusters is not known. What are the signs or symptoms? A mole may be:  [place] or black.  Flat or raised.  Smooth or wrinkled.  How is this diagnosed? A mole is diagnosed with a skin exam. If your health care provider thinks a mole may be cancerous, a piece of the mole will be removed for testing. How is this treated? Treatment is not needed unless a mole is cancerous. If a mole is cancerous, it will be removed. If a mole is causing pain or you do not like the way it looks, you may choose to have it removed. Follow these instructions at home:  Every month, look for new moles and check your existing moles for changes. This is important because a change in a mole can mean that the mole has become cancerous. Look for changes in: ? Size. Look for moles that are more than  in (0.64 cm) wide (in diameter). ? Shape. Look for moles that are not round or oval. ? Borders. Look for moles that are not symmetrical. ? Color. Note that it is normal for moles  to get darker during pregnancy or when you take birth control  pills.  When you are outdoors, wear sunscreen with SPF 30 (sun protection factor 30) or higher. Reapply the sunscreen every 2-3 hours.  If you have a large number of moles, see a skin doctor (dermatologist) at least one time every year. Contact a health care provider if:  The size, shape, borders, or color of your mole change.  Your mole, or the skin near the mole, becomes painful, sore, red, or swollen.  Your mole: ? Develops more than one color. ? Itches or bleeds. ? Becomes scaly, sheds skin, or oozes fluid. ? Becomes flat or develops raised areas. ? Becomes hard or soft.  You develop a new mole. This information is not intended to replace advice given to you by your health care provider. Make sure you discuss any questions you have with your health care provider. Document Released: 01/19/2001 Document Revised: 10/08/2015 Document Reviewed: 02/14/2015 Elsevier Interactive Patient Education  2018 [employer].   IF you received an x-ray today, you will receive an invoice from [facility]. Please contact [facility] at [phone] with questions or concerns regarding your invoice.   IF you received labwork today, you will receive an invoice from [facility]. Please contact [facility] at [phone] with questions or concerns regarding your invoice.   Our billing staff will not be able to assist you with questions regarding bills from these companies.  You will be contacted with the lab results as soon as they are available. The fastest way to get your results is to activate your My Chart account. Instructions are located on the last page of this paperwork. If you have not heard from [place] regarding the results in 2 weeks, please contact this office.

## 2017-06-02 NOTE — Progress Notes (Signed)
  Tuberculosis Risk Questionnaire  1. No Were you born outside the [place] in one of the following parts of the world: [place], [place], [place], [place] or [place]?    2. No Have you traveled outside the [place] and lived for more than one month in one of the following parts of the world: [place], [place], [place], [place] or [place]?    3. Yes Diabetes Do you have a compromised immune system such as from any of the following conditions:HIV/AIDS, organ or bone marrow transplantation, diabetes, immunosuppressive medicines (e.g. Prednisone, Remicaide), leukemia, lymphoma, cancer of the head or neck, gastrectomy or jejunal bypass, end-stage renal disease (on dialysis), or silicosis?     4. 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    5. No Have you ever: injected illegal drugs, used crack cocaine, lived in a homeless shelter  or been in jail or prison?     6. No Have you ever been exposed to anyone with infectious tuberculosis?  7. No Have you ever had a BCG vaccine? (BCG is a vaccine for tuberculosis  (TB) used in OTHER countries, NOT in the [place]).  8. No Have you ever been advised by a health care provider NOT to have a TB skin test?  9. No Have you ever had a POSITIVE TB skin test?  IF SO, when? n/a  IF SO, were you treated with INH? n/a  IF SO, where? n/a  Tuberculosis Symptom Questionnaire  Do you currently have any of the following symptoms?  1. No Unexplained cough lasting more than 3 weeks?   2. No Unexplained fever lasting more than 3 weeks.   3. No Night Sweats (sweating that leaves the bedclothes and sheets wet)     4. No Shortness of Breath   5. No Chest Pain   6. No Unintentional weight loss    7. No Unexplained fatigue (very tired for no reason)

## 2017-06-04 ENCOUNTER — Other Ambulatory Visit: Payer: Self-pay

## 2017-06-04 ENCOUNTER — Encounter (HOSPITAL_COMMUNITY): Payer: Self-pay | Admitting: Emergency Medicine

## 2017-06-04 ENCOUNTER — Emergency Department (HOSPITAL_COMMUNITY)
Admission: EM | Admit: 2017-06-04 | Discharge: 2017-06-05 | Disposition: A | Discharge status: Home / Self Care | Payer: BLUE CROSS/BLUE SHIELD | Attending: Emergency Medicine | Admitting: Emergency Medicine

## 2017-06-04 ENCOUNTER — Emergency Department (HOSPITAL_COMMUNITY): Payer: BLUE CROSS/BLUE SHIELD

## 2017-06-04 DIAGNOSIS — R002 Palpitations: Secondary | ICD-10-CM | POA: Diagnosis present

## 2017-06-04 DIAGNOSIS — I480 Paroxysmal atrial fibrillation: Secondary | ICD-10-CM | POA: Insufficient documentation

## 2017-06-04 DIAGNOSIS — I129 Hypertensive chronic kidney disease with stage 1 through stage 4 chronic kidney disease, or unspecified chronic kidney disease: Secondary | ICD-10-CM | POA: Diagnosis not present

## 2017-06-04 DIAGNOSIS — N189 Chronic kidney disease, unspecified: Secondary | ICD-10-CM | POA: Insufficient documentation

## 2017-06-04 DIAGNOSIS — E039 Hypothyroidism, unspecified: Secondary | ICD-10-CM | POA: Insufficient documentation

## 2017-06-04 DIAGNOSIS — Z794 Long term (current) use of insulin: Secondary | ICD-10-CM | POA: Insufficient documentation

## 2017-06-04 DIAGNOSIS — Z79899 Other long term (current) drug therapy: Secondary | ICD-10-CM | POA: Insufficient documentation

## 2017-06-04 DIAGNOSIS — E119 Type 2 diabetes mellitus without complications: Secondary | ICD-10-CM | POA: Insufficient documentation

## 2017-06-04 DIAGNOSIS — Z87891 Personal history of nicotine dependence: Secondary | ICD-10-CM | POA: Insufficient documentation

## 2017-06-04 DIAGNOSIS — I2510 Atherosclerotic heart disease of native coronary artery without angina pectoris: Secondary | ICD-10-CM | POA: Diagnosis not present

## 2017-06-04 DIAGNOSIS — J45909 Unspecified asthma, uncomplicated: Secondary | ICD-10-CM | POA: Insufficient documentation

## 2017-06-04 LAB — CBC
HCT: 38.6 % (ref 36.0–46.0)
Hemoglobin: 13 g/dL (ref 12.0–15.0)
MCH: 29 pg (ref 26.0–34.0)
MCHC: 33.7 g/dL (ref 30.0–36.0)
MCV: 86 fL (ref 78.0–100.0)
Platelets: 339 10*3/uL (ref 150–400)
RBC: 4.49 MIL/uL (ref 3.87–5.11)
RDW: 13 % (ref 11.5–15.5)
WBC: 6.9 10*3/uL (ref 4.0–10.5)

## 2017-06-04 LAB — BASIC METABOLIC PANEL
Anion gap: 17 — ABNORMAL HIGH (ref 5–15)
BUN: 20 mg/dL (ref 6–20)
CO2: 23 mmol/L (ref 22–32)
Calcium: 9.1 mg/dL (ref 8.9–10.3)
Chloride: 96 mmol/L — ABNORMAL LOW (ref 101–111)
Creatinine, Ser: 1.29 mg/dL — ABNORMAL HIGH (ref 0.44–1.00)
GFR calc Af Amer: 50 mL/min — ABNORMAL LOW (ref >60)
GFR calc non Af Amer: 43 mL/min — ABNORMAL LOW (ref >60)
Glucose, Bld: 203 mg/dL — ABNORMAL HIGH (ref 65–99)
Potassium: 3.8 mmol/L (ref 3.5–5.1)
Sodium: 136 mmol/L (ref 135–145)

## 2017-06-04 LAB — I-STAT TROPONIN, ED: Troponin i, poc: 0.12 ng/mL (ref 0.00–0.08)

## 2017-06-04 NOTE — ED Triage Notes (Signed)
Pt BIB EMS for afib. Pt began having chest pain and feeling a "flutter" 2 hours PTA. Hx afib with cardioversion. Initial EMS HR 180s. Given 5mg  metoprolol without relief. Given 2.5mg  midazolam and cardioverted at 200J. Pt still in afib with a rate of 78. Given 451mL NS.

## 2017-06-04 NOTE — ED Notes (Signed)
Troponin result received from phlebotomy

## 2017-06-04 NOTE — ED Notes (Signed)
Nurse collected labs. 

## 2017-06-04 NOTE — Discharge Instructions (Signed)
Return here as needed. Follow up with your Cardiologist

## 2017-06-05 NOTE — ED Notes (Signed)
Pt an family understood dc material. NAD noted

## 2017-06-06 ENCOUNTER — Telehealth (HOSPITAL_COMMUNITY): Payer: Self-pay | Admitting: *Deleted

## 2017-06-06 NOTE — Telephone Encounter (Signed)
LMOM for pt to [facility] to sched f/u appt from ED

## 2017-06-06 NOTE — ED Provider Notes (Cosign Needed)
[facility] EMERGENCY DEPARTMENT Provider Note   CSN: [id number] Arrival date & time: 06/04/17  1947     History   Chief Complaint Chief Complaint  Patient presents with  . Atrial Fibrillation    HPI Judith [name] is a 67 y.o. female.  HPI Patient presents to the emergency department with an episode of tachycardia and palpitations.  The patient states this feels similar to her previous episodes of atrial fibrillation.  Patient states that this started after she awoke from a nap.  She states that EMS gave her some medications which did not seem to help then she was cardioverted by them at [address].  She states that she did have some nausea and then one episode of vomiting prior to EMS arrival.  Patient states she did have some chest discomfort prior to their arrival as well.  The patient arrived in normal sinus rhythm.  Patient states she feels fine at this time and she states she does not want to be admitted to the hospital patient states that she has not had any symptoms since they cardioverted her.  The patient denies chest pain, shortness of breath, headache,blurred vision, neck pain, fever, cough, weakness, numbness, dizziness, anorexia, edema, abdominal pain, diarrhea, rash, back pain, dysuria, hematemesis, bloody stool, near syncope, or syncope. Past Medical History:  Diagnosis Date  . Abnormal EKG 07/31/2013  . Arthritis    "hands" (03/06/2015)  . Asthma   . Carpal tunnel syndrome, bilateral   . Chronic kidney disease   . Complication of anesthesia    slow to wake up  . Coronary artery disease     2 v CAD with CTO of the RCA and high grade bifurcational LCx/OM stenosis. S/P PCI DES x 2 to the LCx/OM.  Marland Kitchen Diabetic peripheral neuropathy ([place]) "since 1996"  . GERD (gastroesophageal reflux disease)   . Goiter   . Headache    migraines prior to menopause  . History of shingles 06/01/2013  . Hyperlipidemia LDL goal <70 10/13/2015  . Hypertension   . Hyperthyroidism    . PAF (paroxysmal atrial fibrillation) ([place]) 04/29/2015   CHADS2VASC score of 4 now on Apixaban  . Pneumonia ~ 1976  . Tremors of nervous system   . Type II diabetes mellitus ([facility])    insulin dependent    Patient Active Problem List   Diagnosis Date Noted  . SBO (small bowel obstruction) ([place]) 05/10/2016  . S/P transmetatarsal amputation of foot, right ([place]) 04/19/2016  . Obstructive sleep apnea 11/26/2015  . Bilateral carpal tunnel syndrome 11/26/2015  . Hypomagnesemia 11/16/2015  . Hyperlipidemia LDL goal <70 10/13/2015  . Abnormality of gait 09/02/2015  . Memory loss 08/12/2015  . Diabetic peripheral neuropathy ([place]) 08/12/2015  . Vitamin D deficiency 08/12/2015  . Hyperthyroidism 04/30/2015  . Heme positive stool   . Persistent atrial fibrillation ([place]) 04/29/2015  . Coronary artery disease with stable angina pectoris ([place]) 03/29/2015  . Abnormal nuclear stress test   . Acute kidney injury ([place]) 02/12/2015  . History of goiter 09/28/2014  . GERD (gastroesophageal reflux disease) 07/31/2013  . Depression with anxiety 06/01/2013  . DM2 (diabetes mellitus, type 2) ([place]) 01/30/2013  . HTN (hypertension) 01/30/2013  . Tobacco user 01/30/2013    Past Surgical History:  Procedure Laterality Date  . ABDOMINAL HYSTERECTOMY  1988   age 61; CERVICAL DYSPLASIA; ovaries intact.   . AMPUTATION Right 01/23/2016   Procedure: Right 3rd Ray Amputation;  Surgeon: [name], MD;  Location: [facility]  OR;  Service: Orthopedics;  Laterality: Right;  . AMPUTATION Right 02/13/2016   Procedure: Right Transmetatarsal Amputation;  Surgeon: [name], MD;  Location: [facility];  Service: Orthopedics;  Laterality: Right;  . CARDIAC CATHETERIZATION N/A 02/27/2015   Procedure: Left Heart Cath and Coronary Angiography;  Surgeon: [name], MD; LAD 40%, mCFX 80%, OM 70%, RCA 100% calcified       . CARDIAC CATHETERIZATION N/A 03/06/2015   Procedure: Coronary Stent Intervention;  Surgeon: [name], MD;  Location: [facility];  Service: Cardiovascular;  Laterality: N/A;  Mid CX 3.50x12 promus DES w/ 0% resdual and Prox OM1 2.50x20 promus DES w/ 20% residual  . CARPAL TUNNEL RELEASE Right Nov 2015  . CARPAL TUNNEL RELEASE Right 1992; 05/2014   [name]; [place]  . CESAREAN SECTION  1982; 1984  . FOOT NEUROMA SURGERY Bilateral 2000  . KNEE ARTHROSCOPY Right ~ 2003   "meniscus repair"  . SHOULDER OPEN ROTATOR CUFF REPAIR Right 1996; 1998   "w/fracture repair"  . THYROID SURGERY  2000   "removed lots of nodules"  . TONSILLECTOMY  1976    OB History    No data available       Home Medications    Prior to Admission medications   Medication Sig Start Date End Date Taking? Authorizing Provider  albuterol (PROVENTIL HFA) 108 (90 Base) MCG/ACT inhaler Inhale 1-2 puffs into the lungs every 6 (six) hours as needed for wheezing or shortness of breath. 11/07/15  Yes [name], MD  amLODipine (NORVASC) 5 MG tablet TAKE ONE TABLET BY MOUTH ONCE DAILY 05/11/17  Yes Barrett, Evelene Croon, PA-C  atorvastatin (LIPITOR) 20 MG tablet Take 1 tablet (20 mg total) by mouth daily. 04/22/15  Yes Richardson Dopp T, PA-C  buPROPion [facility] SR) 150 MG 12 hr tablet TAKE ONE TABLET BY MOUTH TWICE DAILY 03/16/17  Yes [name], PA-C  clopidogrel (PLAVIX) 75 MG tablet Take 1 tablet (75 mg total) by mouth daily. Please make yearly appt with Dr. Radford Pax for May before anymore refills. 1st attempt 05/12/17  Yes Turner, Eber Hong, MD  ELIQUIS 5 MG TABS tablet TAKE ONE TABLET BY MOUTH TWICE DAILY 05/12/17  Yes Turner, Eber Hong, MD  fenofibrate (TRICOR) 145 MG tablet Take 1 tablet (145 mg total) by mouth daily. 09/27/16 09/22/17 Yes Turner, Eber Hong, MD  FLUoxetine (PROZAC) 20 MG capsule TAKE ONE CAPSULE BY MOUTH AT BEDTIME 03/16/17  Yes [name], PA-C  fluticasone (FLOVENT HFA) 44 MCG/ACT inhaler Inhale 2 puffs into the lungs 2 (two) times daily as needed (shortness of breath/ wheezing).    Yes  [provider]  gabapentin (NEURONTIN) 300 MG capsule Take 3 capsules (900 mg total) by mouth 2 (two) times daily. TAKE 3 CAPSULES BY MOUTH EVERY  MORNING AND 3 CAPSULES AT BEDTIME Patient taking differently: Take 900 mg by mouth 2 (two) times daily.  12/29/16  Yes [name], PA-C  insulin degludec (TRESIBA FLEXTOUCH) 100 UNIT/ML SOPN FlexTouch Pen Inject 0.2 mLs (20 Units total) into the skin at bedtime. 05/13/16  Yes Renato Shin, MD  insulin regular (NOVOLIN R RELION) 100 units/mL injection Inject 15-25-10 units three times daily just before each meal. Patient taking differently: Inject 10-25 Units into the skin 3 (three) times daily before meals. Inject 15 units every morning then use 25 units at lunch and then 10 units at supper 01/21/17  Yes Renato Shin, MD  losartan (COZAAR) 50 MG tablet TAKE  ONE TABLET BY MOUTH ONCE DAILY Patient taking differently: TAKE 50 MG BY MOUTH ONCE DAILY 04/05/16  Yes Turner, Eber Hong, MD  metFORMIN (GLUCOPHAGE-XR) 500 MG 24 hr tablet Take 4 tablets (2,000 mg total) daily with breakfast by mouth. Patient taking differently: Take 1,000 mg by mouth 2 (two) times daily.  03/25/17  Yes Renato Shin, MD  methimazole (TAPAZOLE) 10 MG tablet TAKE ONE TABLET BY MOUTH ONCE DAILY 05/11/17  Yes Renato Shin, MD  metoprolol succinate (TOPROL-XL) 25 MG 24 hr tablet TAKE ONE TABLET BY MOUTH TWICE DAILY 12/14/16  Yes Turner, Eber Hong, MD  nitroGLYCERIN (NITROSTAT) 0.4 MG SL tablet Place 1 tablet (0.4 mg total) under the tongue every 5 (five) minutes as needed for chest pain. 02/10/17  Yes [name], PA-C  pantoprazole (PROTONIX) 40 MG tablet Take 1 tablet (40 mg total) by mouth daily. 12/29/16  Yes [name], PA-C  sotalol (BETAPACE) 80 MG tablet TAKE ONE TABLET BY MOUTH EVERY 12 HOURS 12/14/16  Yes Barrett, Evelene Croon, PA-C  traMADol (ULTRAM) 50 MG tablet Take 1 tablet (50 mg total) by mouth every 8 (eight) hours as needed. Patient taking differently: Take 50 mg by  mouth at bedtime.  05/13/17  Yes [name], MD  clotrimazole-betamethasone (LOTRISONE) cream Apply 1 application topically 2 (two) times daily. Patient not taking: Reported on 06/04/2017 02/10/17   [name], PA-C  cyclobenzaprine (FLEXERIL) 5 MG tablet Take 1 tablet (5 mg total) by mouth 3 (three) times daily as needed. for muscle spams Patient not taking: Reported on 06/04/2017 02/10/17   [name], PA-C  fluconazole (DIFLUCAN) 100 MG tablet Take Diflucan 100 mg PO daily for 10 days Patient not taking: Reported on 06/04/2017 11/05/16   Mauri Pole, MD  insulin aspart (NOVOLOG) 100 UNIT/ML injection Inject 15 units at breakfast 25 units at lunch and 10 units at dinner Patient not taking: Reported on 06/04/2017 02/01/17   Renato Shin, MD  Insulin Pen Needle 32G X 4 MM MISC Use to inject insulin daily 08/13/16   Renato Shin, MD  Insulin Syringe-Needle U-100 (INSULIN SYRINGE .5CC/31GX5/16") 31G X 5/16" 0.5 ML MISC Use to inject insulin 08/13/16   Renato Shin, MD  ondansetron (ZOFRAN) 4 MG tablet Take 1 tablet (4 mg total) by mouth every 8 (eight) hours as needed for nausea or vomiting. Patient not taking: Reported on 06/04/2017 10/21/16   Mauri Pole, MD  SSD 1 % cream APPLY TO AFFECTED AREA ONCE DAILY Patient not taking: Reported on 06/04/2017 03/11/17   [name], MD    Family History Family History  Problem Relation Age of Onset  . Cancer Mother 53       bronchial cancer  . Breast cancer Mother   . Lung cancer Mother   . Hypertension Father   . COPD Father   . Heart disease Father 88       CAD with cardiac stenting  . Heart attack Father   . Parkinson's disease Father   . Allergies Sister   . Breast cancer Maternal Grandmother   . Emphysema Maternal Grandfather   . Leukemia Paternal Grandmother   . Emphysema Paternal Grandfather   . Thyroid disease Neg Hx     Social History Social History   Tobacco Use  . Smoking status: Former Smoker    Packs/day: 0.00      Years: 41.00    Pack years: 0.00    Types: Cigarettes    Last attempt to quit:  03/05/2015    Years since quitting: 2.2  . Smokeless tobacco: Never Used  . Tobacco comment: 04/29/2015 "quit smoking cigarettes 02/27/2015"  Substance Use Topics  . Alcohol use: No  . Drug use: No     Allergies   Dilaudid [hydromorphone hcl]; Novolog [insulin aspart]; Codeine; Iodine; Penicillins; Propofol; Ace inhibitors; Demerol [meperidine]; Neosporin [neomycin-bacitracin zn-polymyx]; Percocet [oxycodone-acetaminophen]; and Tape   Review of Systems Review of Systems All other systems negative except as documented in the HPI. All pertinent positives and negatives as reviewed in the HPI.  Physical Exam Updated Vital Signs BP 103/68   Pulse 81   Temp 98 F (36.7 C) (Oral)   Resp (!) 21   Ht 5\' 3"  (1.6 m)   Wt 81.2 kg (179 lb)   SpO2 95%   BMI 31.71 kg/m   Physical Exam  Constitutional: She is oriented to person, place, and time. She appears well-developed and well-nourished. No distress.  HENT:  Head: Normocephalic and atraumatic.  Mouth/Throat: Oropharynx is clear and moist.  Eyes: Pupils are equal, round, and reactive to light.  Neck: Normal range of motion. Neck supple.  Cardiovascular: Normal rate, regular rhythm and normal heart sounds. Exam reveals no gallop and no friction rub.  No murmur heard. Pulmonary/Chest: Effort normal and breath sounds normal. No respiratory distress. She has no wheezes.  Abdominal: Soft. Bowel sounds are normal. She exhibits no distension. There is no tenderness.  Neurological: She is alert and oriented to person, place, and time. She exhibits normal muscle tone. Coordination normal.  Skin: Skin is warm and dry. Capillary refill takes less than 2 seconds. No rash noted. No erythema.  Psychiatric: She has a normal mood and affect. Her behavior is normal.  Nursing note and vitals reviewed.    ED Treatments / Results  Labs (all labs ordered are  listed, but only abnormal results are displayed) Labs Reviewed  BASIC METABOLIC PANEL - Abnormal; Notable for the following components:      Result Value   Chloride 96 (*)    Glucose, Bld 203 (*)    Creatinine, Ser 1.29 (*)    GFR calc non Af Amer 43 (*)    GFR calc Af Amer 50 (*)    Anion gap 17 (*)    All other components within normal limits  I-STAT TROPONIN, ED - Abnormal; Notable for the following components:   Troponin i, poc 0.12 (*)    All other components within normal limits  CBC    EKG  EKG Interpretation  Date/Time:  Saturday June 04 2017 19:56:12 EST Ventricular Rate:  82 PR Interval:    QRS Duration: 86 QT Interval:  409 QTC Calculation: 478 R Axis:   -41 Text Interpretation:  Sinus rhythm Atrial premature complex Left axis deviation Anterior infarct, old Borderline ST depression, lateral leads Baseline wander in lead(s) V4 V5 Confirmed by Fredia Sorrow [phone]) on 06/05/2017 1:20:40 PM       Radiology Dg Chest 2 View  Result Date: 06/04/2017 CLINICAL DATA:  Atrial fibrillation EXAM: CHEST  2 VIEW COMPARISON:  Chest radiograph 02/24/2017 FINDINGS: Monitoring leads overlie the patient. Stable cardiac and mediastinal contours. No consolidative pulmonary opacities. No pleural effusion or pneumothorax. Thoracic spine degenerative changes. IMPRESSION: No acute cardiopulmonary process. Electronically Signed   By: Lovey Newcomer M.D.   On: 06/04/2017 20:27    Procedures Procedures (including critical care time)  Medications Ordered in ED Medications - No data to display   Initial Impression / Assessment and  Plan / ED Course  I have reviewed the triage vital signs and the nursing notes.  Pertinent labs & imaging results that were available during my care of the patient were reviewed by me and considered in my medical decision making (see chart for details).    The patient does not want to be admitted to the hospital she had an elevated troponin during an  episode previously with her atrial fibrillation and after being cardioverted.  I advised her that this elevated troponin may or may not represent anything significant at this point she is willing to be discharged home with these facts.  Patient is advised to return for any worsening her condition.  I have advised her to follow-up with her cardiologist as well.  Patient agrees to this plan and voiced an understanding.  Final Clinical Impressions(s) / ED Diagnoses   Final diagnoses:  Paroxysmal atrial fibrillation [facility])    ED Discharge Orders    None       Dalia Heading, [place] 06/06/17 0138

## 2017-06-08 ENCOUNTER — Telehealth: Payer: Self-pay | Admitting: Urgent Care

## 2017-06-08 LAB — QUANTIFERON-TB GOLD PLUS
QuantiFERON Nil Value: 0.29 IU/mL
QuantiFERON TB1 Ag Value: 0.31 IU/mL
QuantiFERON TB2 Ag Value: 0.11 IU/mL
QuantiFERON-TB Gold Plus: NEGATIVE

## 2017-06-08 NOTE — Telephone Encounter (Signed)
Message sent to [facility] - pt req results from Quantiferon-TB testing. Has not been released yet.

## 2017-06-08 NOTE — Telephone Encounter (Signed)
Copied from [place] [id number]. Topic: Quick Communication - Other Results >> Jun 08, 2017 10:23 AM Cecelia Byars, NT wrote: Patient called  wanting the results from testing done recently ,please advise

## 2017-06-08 NOTE — Telephone Encounter (Signed)
Watching box of mani.  As stated prior messaging, the lab was not resulted until today.  Releasing now.

## 2017-06-28 ENCOUNTER — Other Ambulatory Visit: Payer: Self-pay | Admitting: Endocrinology

## 2017-07-01 ENCOUNTER — Telehealth: Payer: Self-pay | Admitting: *Deleted

## 2017-07-01 ENCOUNTER — Ambulatory Visit: Payer: BLUE CROSS/BLUE SHIELD | Admitting: Podiatry

## 2017-07-01 ENCOUNTER — Encounter: Payer: Self-pay | Admitting: Podiatry

## 2017-07-01 DIAGNOSIS — S91302A Unspecified open wound, left foot, initial encounter: Secondary | ICD-10-CM | POA: Diagnosis not present

## 2017-07-01 DIAGNOSIS — E1149 Type 2 diabetes mellitus with other diabetic neurological complication: Secondary | ICD-10-CM

## 2017-07-01 MED ORDER — SILVER SULFADIAZINE 1 % EX CREA: 1.0000 "application " | TOPICAL_CREAM | Freq: Every day | CUTANEOUS | 1 refills | Status: DC

## 2017-07-01 NOTE — Telephone Encounter (Signed)
Pt requested refill of silvadene, she thought she had some when she saw Dr. Jacqualyn Posey today.

## 2017-07-01 NOTE — Telephone Encounter (Signed)
I informed pt Dr. Jacqualyn Posey had refilled silvadene.

## 2017-07-04 ENCOUNTER — Emergency Department (HOSPITAL_COMMUNITY): Payer: Medicare PPO

## 2017-07-04 ENCOUNTER — Observation Stay (HOSPITAL_COMMUNITY): Payer: Medicare PPO

## 2017-07-04 ENCOUNTER — Other Ambulatory Visit: Payer: Self-pay

## 2017-07-04 ENCOUNTER — Observation Stay (HOSPITAL_COMMUNITY)
Admission: EM | Admit: 2017-07-04 | Discharge: 2017-07-05 | Disposition: A | Discharge status: Home / Self Care | Payer: Medicare PPO | Attending: Internal Medicine | Admitting: Internal Medicine

## 2017-07-04 ENCOUNTER — Ambulatory Visit: Payer: BLUE CROSS/BLUE SHIELD | Admitting: Endocrinology

## 2017-07-04 ENCOUNTER — Encounter (HOSPITAL_COMMUNITY): Payer: Self-pay | Admitting: Nurse Practitioner

## 2017-07-04 DIAGNOSIS — N189 Chronic kidney disease, unspecified: Secondary | ICD-10-CM | POA: Insufficient documentation

## 2017-07-04 DIAGNOSIS — IMO0002 Reserved for concepts with insufficient information to code with codable children: Secondary | ICD-10-CM | POA: Diagnosis present

## 2017-07-04 DIAGNOSIS — Z888 Allergy status to other drugs, medicaments and biological substances status: Secondary | ICD-10-CM | POA: Diagnosis not present

## 2017-07-04 DIAGNOSIS — Z7901 Long term (current) use of anticoagulants: Secondary | ICD-10-CM | POA: Diagnosis not present

## 2017-07-04 DIAGNOSIS — E785 Hyperlipidemia, unspecified: Secondary | ICD-10-CM | POA: Insufficient documentation

## 2017-07-04 DIAGNOSIS — I4891 Unspecified atrial fibrillation: Secondary | ICD-10-CM | POA: Diagnosis not present

## 2017-07-04 DIAGNOSIS — Z794 Long term (current) use of insulin: Secondary | ICD-10-CM | POA: Diagnosis not present

## 2017-07-04 DIAGNOSIS — E1142 Type 2 diabetes mellitus with diabetic polyneuropathy: Secondary | ICD-10-CM | POA: Insufficient documentation

## 2017-07-04 DIAGNOSIS — I129 Hypertensive chronic kidney disease with stage 1 through stage 4 chronic kidney disease, or unspecified chronic kidney disease: Secondary | ICD-10-CM | POA: Insufficient documentation

## 2017-07-04 DIAGNOSIS — E1122 Type 2 diabetes mellitus with diabetic chronic kidney disease: Secondary | ICD-10-CM | POA: Diagnosis not present

## 2017-07-04 DIAGNOSIS — Z8619 Personal history of other infectious and parasitic diseases: Secondary | ICD-10-CM | POA: Insufficient documentation

## 2017-07-04 DIAGNOSIS — I472 Ventricular tachycardia: Secondary | ICD-10-CM

## 2017-07-04 DIAGNOSIS — K219 Gastro-esophageal reflux disease without esophagitis: Secondary | ICD-10-CM | POA: Diagnosis not present

## 2017-07-04 DIAGNOSIS — J45909 Unspecified asthma, uncomplicated: Secondary | ICD-10-CM | POA: Diagnosis not present

## 2017-07-04 DIAGNOSIS — I700 Atherosclerosis of aorta: Secondary | ICD-10-CM | POA: Diagnosis not present

## 2017-07-04 DIAGNOSIS — Z79899 Other long term (current) drug therapy: Secondary | ICD-10-CM | POA: Diagnosis not present

## 2017-07-04 DIAGNOSIS — Z880 Allergy status to penicillin: Secondary | ICD-10-CM | POA: Insufficient documentation

## 2017-07-04 DIAGNOSIS — G629 Polyneuropathy, unspecified: Secondary | ICD-10-CM

## 2017-07-04 DIAGNOSIS — I2510 Atherosclerotic heart disease of native coronary artery without angina pectoris: Secondary | ICD-10-CM | POA: Diagnosis not present

## 2017-07-04 DIAGNOSIS — R000 Tachycardia, unspecified: Secondary | ICD-10-CM

## 2017-07-04 DIAGNOSIS — I480 Paroxysmal atrial fibrillation: Principal | ICD-10-CM | POA: Insufficient documentation

## 2017-07-04 DIAGNOSIS — Z87891 Personal history of nicotine dependence: Secondary | ICD-10-CM | POA: Diagnosis not present

## 2017-07-04 DIAGNOSIS — Z885 Allergy status to narcotic agent status: Secondary | ICD-10-CM | POA: Insufficient documentation

## 2017-07-04 DIAGNOSIS — R739 Hyperglycemia, unspecified: Secondary | ICD-10-CM

## 2017-07-04 DIAGNOSIS — F329 Major depressive disorder, single episode, unspecified: Secondary | ICD-10-CM | POA: Diagnosis not present

## 2017-07-04 DIAGNOSIS — Z8639 Personal history of other endocrine, nutritional and metabolic disease: Secondary | ICD-10-CM

## 2017-07-04 DIAGNOSIS — E1165 Type 2 diabetes mellitus with hyperglycemia: Secondary | ICD-10-CM | POA: Diagnosis present

## 2017-07-04 LAB — BASIC METABOLIC PANEL
Anion gap: 13 (ref 5–15)
BUN: 15 mg/dL (ref 6–20)
CALCIUM: 8.7 mg/dL — AB (ref 8.9–10.3)
CO2: 24 mmol/L (ref 22–32)
Chloride: 95 mmol/L — ABNORMAL LOW (ref 101–111)
Creatinine, Ser: 0.74 mg/dL (ref 0.44–1.00)
GFR calc Af Amer: >60 mL/min (ref >60)
GLUCOSE: 331 mg/dL — AB (ref 65–99)
Potassium: 3.5 mmol/L (ref 3.5–5.1)
SODIUM: 132 mmol/L — AB (ref 135–145)

## 2017-07-04 LAB — MAGNESIUM: Magnesium: 1.4 mg/dL — ABNORMAL LOW (ref 1.7–2.4)

## 2017-07-04 LAB — CBC
HCT: 37.5 % (ref 36.0–46.0)
Hemoglobin: 12.6 g/dL (ref 12.0–15.0)
MCH: 29.3 pg (ref 26.0–34.0)
MCHC: 33.6 g/dL (ref 30.0–36.0)
MCV: 87.2 fL (ref 78.0–100.0)
PLATELETS: 427 10*3/uL — AB (ref 150–400)
RBC: 4.3 MIL/uL (ref 3.87–5.11)
RDW: 13.3 % (ref 11.5–15.5)
WBC: 7.8 10*3/uL (ref 4.0–10.5)

## 2017-07-04 LAB — I-STAT CHEM 8, ED
BUN: 17 mg/dL (ref 6–20)
CALCIUM ION: 1.1 mmol/L — AB (ref 1.15–1.40)
CREATININE: 0.6 mg/dL (ref 0.44–1.00)
Chloride: 96 mmol/L — ABNORMAL LOW (ref 101–111)
GLUCOSE: 334 mg/dL — AB (ref 65–99)
HCT: 39 % (ref 36.0–46.0)
HEMOGLOBIN: 13.3 g/dL (ref 12.0–15.0)
POTASSIUM: 3.5 mmol/L (ref 3.5–5.1)
Sodium: 135 mmol/L (ref 135–145)
TCO2: 28 mmol/L (ref 22–32)

## 2017-07-04 LAB — TSH: TSH: 0.897 u[IU]/mL (ref 0.350–4.500)

## 2017-07-04 LAB — TROPONIN I
TROPONIN I: 0.24 ng/mL — AB (ref <0.03)
Troponin I: 0.06 ng/mL (ref <0.03)
Troponin I: 0.23 ng/mL (ref <0.03)

## 2017-07-04 LAB — I-STAT TROPONIN, ED: TROPONIN I, POC: 0.02 ng/mL (ref 0.00–0.08)

## 2017-07-04 LAB — GLUCOSE, CAPILLARY: Glucose-Capillary: 272 mg/dL — ABNORMAL HIGH (ref 65–99)

## 2017-07-04 LAB — CBG MONITORING, ED
GLUCOSE-CAPILLARY: 323 mg/dL — AB (ref 65–99)
Glucose-Capillary: 266 mg/dL — ABNORMAL HIGH (ref 65–99)
Glucose-Capillary: 170 mg/dL — ABNORMAL HIGH (ref 65–99)

## 2017-07-04 MED ORDER — BUPROPION HCL ER (SR) 150 MG PO TB12
150.0000 mg | ORAL_TABLET | Freq: Two times a day (BID) | ORAL | Status: DC
  Administered 2017-07-04 – 2017-07-05 (×3): 150 mg via ORAL
  Filled 2017-07-04 (×4): qty 1

## 2017-07-04 MED ORDER — APIXABAN 5 MG PO TABS
5.0000 mg | ORAL_TABLET | Freq: Two times a day (BID) | ORAL | Status: DC
  Administered 2017-07-04 – 2017-07-05 (×3): 5 mg via ORAL
  Filled 2017-07-04 (×4): qty 1

## 2017-07-04 MED ORDER — METOPROLOL TARTRATE 25 MG PO TABS
25.0000 mg | ORAL_TABLET | Freq: Two times a day (BID) | ORAL | Status: DC
Start: 2017-07-04 — End: 2017-07-05
  Administered 2017-07-04 – 2017-07-05 (×3): 25 mg via ORAL
  Filled 2017-07-04 (×3): qty 1

## 2017-07-04 MED ORDER — POTASSIUM CHLORIDE CRYS ER 20 MEQ PO TBCR
40.0000 meq | EXTENDED_RELEASE_TABLET | Freq: Once | ORAL | Status: DC
  Filled 2017-07-04: qty 2

## 2017-07-04 MED ORDER — NITROGLYCERIN 0.4 MG SL SUBL: 0.4000 mg | SUBLINGUAL_TABLET | SUBLINGUAL | Status: DC | PRN

## 2017-07-04 MED ORDER — PANTOPRAZOLE SODIUM 40 MG PO TBEC
40.0000 mg | DELAYED_RELEASE_TABLET | Freq: Every day | ORAL | Status: DC
  Administered 2017-07-04 – 2017-07-05 (×2): 40 mg via ORAL
  Filled 2017-07-04 (×2): qty 1

## 2017-07-04 MED ORDER — METHIMAZOLE 10 MG PO TABS
10.0000 mg | ORAL_TABLET | Freq: Every day | ORAL | Status: DC
  Administered 2017-07-04 – 2017-07-05 (×2): 10 mg via ORAL
  Filled 2017-07-04 (×3): qty 1

## 2017-07-04 MED ORDER — INSULIN ASPART 100 UNIT/ML ~~LOC~~ SOLN
0.0000 [IU] | Freq: Three times a day (TID) | SUBCUTANEOUS | Status: DC
  Administered 2017-07-04: 2 [IU] via SUBCUTANEOUS
  Administered 2017-07-04: 5 [IU] via SUBCUTANEOUS
  Administered 2017-07-05 (×2): 2 [IU] via SUBCUTANEOUS
  Administered 2017-07-05: 7 [IU] via SUBCUTANEOUS
  Filled 2017-07-04 (×2): qty 1

## 2017-07-04 MED ORDER — SILVER SULFADIAZINE 1 % EX CREA
1.0000 "application " | TOPICAL_CREAM | Freq: Every day | CUTANEOUS | Status: DC
  Administered 2017-07-04 – 2017-07-05 (×2): 1 via TOPICAL
  Filled 2017-07-04: qty 85

## 2017-07-04 MED ORDER — BUDESONIDE 0.25 MG/2ML IN SUSP
0.2500 mg | Freq: Two times a day (BID) | RESPIRATORY_TRACT | Status: DC | PRN
  Filled 2017-07-04: qty 2

## 2017-07-04 MED ORDER — METFORMIN HCL ER 500 MG PO TB24
1000.0000 mg | ORAL_TABLET | Freq: Two times a day (BID) | ORAL | Status: DC
  Administered 2017-07-04 – 2017-07-05 (×3): 1000 mg via ORAL
  Filled 2017-07-04 (×4): qty 2

## 2017-07-04 MED ORDER — SOTALOL HCL 80 MG PO TABS
80.0000 mg | ORAL_TABLET | Freq: Two times a day (BID) | ORAL | Status: DC
  Administered 2017-07-04 – 2017-07-05 (×3): 80 mg via ORAL
  Filled 2017-07-04 (×4): qty 1

## 2017-07-04 MED ORDER — CLOPIDOGREL BISULFATE 75 MG PO TABS
75.0000 mg | ORAL_TABLET | Freq: Every day | ORAL | Status: DC
  Administered 2017-07-04 – 2017-07-05 (×2): 75 mg via ORAL
  Filled 2017-07-04 (×2): qty 1

## 2017-07-04 MED ORDER — ALBUTEROL SULFATE (2.5 MG/3ML) 0.083% IN NEBU: 2.5000 mg | INHALATION_SOLUTION | Freq: Four times a day (QID) | RESPIRATORY_TRACT | Status: DC | PRN

## 2017-07-04 MED ORDER — ASPIRIN 81 MG PO CHEW
324.0000 mg | CHEWABLE_TABLET | Freq: Once | ORAL | Status: AC
  Administered 2017-07-04: 324 mg via ORAL
  Filled 2017-07-04: qty 4

## 2017-07-04 MED ORDER — FLUOXETINE HCL 20 MG PO CAPS
20.0000 mg | ORAL_CAPSULE | Freq: Every day | ORAL | Status: DC
  Administered 2017-07-04: 20 mg via ORAL
  Filled 2017-07-04 (×2): qty 1

## 2017-07-04 MED ORDER — DILTIAZEM LOAD VIA INFUSION
10.0000 mg | Freq: Once | INTRAVENOUS | Status: AC
  Administered 2017-07-04: 10 mg via INTRAVENOUS
  Filled 2017-07-04: qty 10

## 2017-07-04 MED ORDER — MAGNESIUM SULFATE 2 GM/50ML IV SOLN
2.0000 g | Freq: Once | INTRAVENOUS | Status: AC
  Administered 2017-07-04: 2 g via INTRAVENOUS
  Filled 2017-07-04: qty 50

## 2017-07-04 MED ORDER — GABAPENTIN 300 MG PO CAPS
900.0000 mg | ORAL_CAPSULE | Freq: Two times a day (BID) | ORAL | Status: DC
  Administered 2017-07-04 – 2017-07-05 (×3): 900 mg via ORAL
  Filled 2017-07-04 (×3): qty 3

## 2017-07-04 MED ORDER — FENOFIBRATE 160 MG PO TABS
160.0000 mg | ORAL_TABLET | Freq: Every day | ORAL | Status: DC
  Administered 2017-07-04 – 2017-07-05 (×2): 160 mg via ORAL
  Filled 2017-07-04 (×2): qty 1

## 2017-07-04 MED ORDER — TRAMADOL HCL 50 MG PO TABS: 50.0000 mg | ORAL_TABLET | Freq: Four times a day (QID) | ORAL | Status: DC | PRN

## 2017-07-04 MED ORDER — SODIUM CHLORIDE 0.9 % IV BOLUS (SEPSIS)
500.0000 mL | Freq: Once | INTRAVENOUS | Status: AC
  Administered 2017-07-04: 500 mL via INTRAVENOUS

## 2017-07-04 MED ORDER — ONDANSETRON HCL 4 MG/2ML IJ SOLN: 4.0000 mg | Freq: Four times a day (QID) | INTRAMUSCULAR | Status: DC | PRN

## 2017-07-04 MED ORDER — INSULIN ASPART 100 UNIT/ML ~~LOC~~ SOLN
0.0000 [IU] | Freq: Every day | SUBCUTANEOUS | Status: DC
  Administered 2017-07-04: 3 [IU] via SUBCUTANEOUS

## 2017-07-04 MED ORDER — DILTIAZEM HCL-DEXTROSE 100-5 MG/100ML-% IV SOLN (PREMIX)
5.0000 mg/h | INTRAVENOUS | Status: DC
  Administered 2017-07-04: 5 mg/h via INTRAVENOUS
  Filled 2017-07-04: qty 100

## 2017-07-04 MED ORDER — POTASSIUM CHLORIDE CRYS ER 20 MEQ PO TBCR
40.0000 meq | EXTENDED_RELEASE_TABLET | Freq: Once | ORAL | Status: AC
  Administered 2017-07-04: 40 meq via ORAL

## 2017-07-04 MED ORDER — ATORVASTATIN CALCIUM 20 MG PO TABS
20.0000 mg | ORAL_TABLET | Freq: Every day | ORAL | Status: DC
  Administered 2017-07-04 – 2017-07-05 (×2): 20 mg via ORAL
  Filled 2017-07-04 (×2): qty 1

## 2017-07-04 MED ORDER — ACETAMINOPHEN 325 MG PO TABS
650.0000 mg | ORAL_TABLET | ORAL | Status: DC | PRN
Start: 2017-07-04 — End: 2017-07-05

## 2017-07-04 MED ORDER — ASPIRIN 81 MG PO CHEW: 324.0000 mg | CHEWABLE_TABLET | Freq: Once | ORAL | Status: DC

## 2017-07-04 NOTE — ED Provider Notes (Signed)
TIME SEEN: 6:19 AM  CHIEF COMPLAINT: Chest pain, palpitations  HPI: Patient is a 67 year old female with history of coronary artery disease, hypertension, hyperlipidemia, diabetes, chronic kidney disease, proximal atrial fibrillation with a chads vasc score of 4 on Eliquis who presents emergency department with chest tightness and palpitations.  States symptoms were present when she woke up at 5 AM to go to the bathroom.  No shortness of breath but has felt nauseated and lightheaded.  No diaphoresis.  No recent fevers, cough, vomiting or diarrhea.  Given 5 mg of IV metoprolol with EMS and 4 mg of IV Zofran.  Heart rate is still in the 140s and irregular.  States she has been cardioverted in the past but states over the past 30 days she is not sure if she has taken her Eliquis every day.  PCP - [name] at [facility] - Dr. Radford Pax  ROS: See HPI Constitutional: no fever  Eyes: no drainage  ENT: no runny nose   Cardiovascular:  chest pain  Resp: no SOB  GI: no vomiting GU: no dysuria Integumentary: no rash  Allergy: no hives  Musculoskeletal: no leg swelling  Neurological: no slurred speech ROS otherwise negative  PAST MEDICAL HISTORY/PAST SURGICAL HISTORY:  Past Medical History:  Diagnosis Date  . Abnormal EKG 07/31/2013  . Arthritis    "hands" (03/06/2015)  . Asthma   . Carpal tunnel syndrome, bilateral   . Chronic kidney disease   . Complication of anesthesia    slow to wake up  . Coronary artery disease     2 v CAD with CTO of the RCA and high grade bifurcational LCx/OM stenosis. S/P PCI DES x 2 to the LCx/OM.  Marland Kitchen Diabetic peripheral neuropathy ([place]) "since 1996"  . GERD (gastroesophageal reflux disease)   . Goiter   . Headache    migraines prior to menopause  . History of shingles 06/01/2013  . Hyperlipidemia LDL goal <70 10/13/2015  . Hypertension   . Hyperthyroidism   . PAF (paroxysmal atrial fibrillation) ([place]) 04/29/2015   CHADS2VASC score of 4 now on  Apixaban  . Pneumonia ~ 1976  . Tremors of nervous system   . Type II diabetes mellitus (HCC)    insulin dependent    MEDICATIONS:  Prior to Admission medications   Medication Sig Start Date End Date Taking? Authorizing Provider  albuterol (PROVENTIL HFA) 108 (90 Base) MCG/ACT inhaler Inhale 1-2 puffs into the lungs every 6 (six) hours as needed for wheezing or shortness of breath. 11/07/15   [name], MD  amLODipine (NORVASC) 5 MG tablet TAKE ONE TABLET BY MOUTH ONCE DAILY 05/11/17   Barrett, Evelene Croon, PA-C  atorvastatin (LIPITOR) 20 MG tablet Take 1 tablet (20 mg total) by mouth daily. 04/22/15   Richardson Dopp T, PA-C  buPROPion [facility] SR) 150 MG 12 hr tablet TAKE ONE TABLET BY MOUTH TWICE DAILY 03/16/17   [name], PA-C  clopidogrel (PLAVIX) 75 MG tablet Take 1 tablet (75 mg total) by mouth daily. Please make yearly appt with Dr. Radford Pax for May before anymore refills. 1st attempt 05/12/17   Sueanne Margarita, MD  clotrimazole-betamethasone (LOTRISONE) cream Apply 1 application topically 2 (two) times daily. Patient not taking: Reported on 06/04/2017 02/10/17   [name], PA-C  cyclobenzaprine (FLEXERIL) 5 MG tablet Take 1 tablet (5 mg total) by mouth 3 (three) times daily as needed. for muscle spams Patient not taking: Reported on 06/04/2017 02/10/17   [name], PA-C  [facility]  5 MG TABS tablet TAKE ONE TABLET BY MOUTH TWICE DAILY 05/12/17   Sueanne Margarita, MD  fenofibrate (TRICOR) 145 MG tablet Take 1 tablet (145 mg total) by mouth daily. 09/27/16 09/22/17  Sueanne Margarita, MD  fluconazole (DIFLUCAN) 100 MG tablet Take Diflucan 100 mg PO daily for 10 days Patient not taking: Reported on 06/04/2017 11/05/16   Mauri Pole, MD  FLUoxetine (PROZAC) 20 MG capsule TAKE ONE CAPSULE BY MOUTH AT BEDTIME 03/16/17   [name], PA-C  fluticasone (FLOVENT HFA) 44 MCG/ACT inhaler Inhale 2 puffs into the lungs 2 (two) times daily as needed (shortness of breath/ wheezing).      [provider]  gabapentin (NEURONTIN) 300 MG capsule Take 3 capsules (900 mg total) by mouth 2 (two) times daily. TAKE 3 CAPSULES BY MOUTH EVERY  MORNING AND 3 CAPSULES AT BEDTIME Patient taking differently: Take 900 mg by mouth 2 (two) times daily.  12/29/16   [name], PA-C  insulin aspart (NOVOLOG) 100 UNIT/ML injection Inject 15 units at breakfast 25 units at lunch and 10 units at dinner Patient not taking: Reported on 06/04/2017 02/01/17   Renato Shin, MD  Insulin Pen Needle 32G X 4 MM MISC Use to inject insulin daily 08/13/16   Renato Shin, MD  insulin regular (NOVOLIN R RELION) 100 units/mL injection Inject 15-25-10 units three times daily just before each meal. Patient taking differently: Inject 10-25 Units into the skin 3 (three) times daily before meals. Inject 15 units every morning then use 25 units at lunch and then 10 units at supper 01/21/17   Renato Shin, MD  Insulin Syringe-Needle U-100 (INSULIN SYRINGE .5CC/31GX5/16") 31G X 5/16" 0.5 ML MISC Use to inject insulin 08/13/16   Renato Shin, MD  losartan (COZAAR) 50 MG tablet TAKE ONE TABLET BY MOUTH ONCE DAILY Patient taking differently: TAKE 50 MG BY MOUTH ONCE DAILY 04/05/16   Sueanne Margarita, MD  metFORMIN (GLUCOPHAGE-XR) 500 MG 24 hr tablet Take 4 tablets (2,000 mg total) daily with breakfast by mouth. Patient taking differently: Take 1,000 mg by mouth 2 (two) times daily.  03/25/17   Renato Shin, MD  methimazole (TAPAZOLE) 10 MG tablet TAKE ONE TABLET BY MOUTH ONCE DAILY 05/11/17   Renato Shin, MD  metoprolol succinate (TOPROL-XL) 25 MG 24 hr tablet TAKE ONE TABLET BY MOUTH TWICE DAILY 12/14/16   Sueanne Margarita, MD  nitroGLYCERIN (NITROSTAT) 0.4 MG SL tablet Place 1 tablet (0.4 mg total) under the tongue every 5 (five) minutes as needed for chest pain. 02/10/17   [name], PA-C  ondansetron (ZOFRAN) 4 MG tablet Take 1 tablet (4 mg total) by mouth every 8 (eight) hours as needed for nausea or vomiting. Patient  not taking: Reported on 06/04/2017 10/21/16   Mauri Pole, MD  pantoprazole (PROTONIX) 40 MG tablet Take 1 tablet (40 mg total) by mouth daily. 12/29/16   [name], PA-C  silver sulfADIAZINE (SILVADENE) 1 % cream Apply 1 application topically daily. 07/01/17   Trula Slade, DPM  sotalol (BETAPACE) 80 MG tablet TAKE ONE TABLET BY MOUTH EVERY 12 HOURS 12/14/16   Barrett, Evelene Croon, PA-C  SSD 1 % cream APPLY TO AFFECTED AREA ONCE DAILY Patient not taking: Reported on 06/04/2017 03/11/17   [name], MD  traMADol (ULTRAM) 50 MG tablet Take 1 tablet (50 mg total) by mouth every 8 (eight) hours as needed. Patient taking differently: Take 50 mg by mouth at bedtime.  05/13/17  [name], MD  TRESIBA FLEXTOUCH 100 UNIT/ML SOPN FlexTouch Pen INJECT 20 UNITS SUBCUTANEOUSLY AT BEDTIME 07/01/17   Renato Shin, MD    ALLERGIES:  Allergies  Allergen Reactions  . Dilaudid [Hydromorphone Hcl] Other (See Comments)    HEADACHE  . Novolog [Insulin Aspart] Shortness Of Breath    "breathing problems"  . Codeine Nausea And Vomiting    HIGH DOSES-SEVERE VOMITING  . Iodine Other (See Comments)    MUST HAVE BENADRYL PRIOR TO PROCEDURE AND RIGHT BEFORE TREATMENT TO COUNTERACT REACTION-BLISTERING REACTION DERMATOLOGICAL  . Penicillins Itching, Rash and Other (See Comments)    Has patient had a PCN reaction causing immediate rash, facial/tongue/throat swelling, SOB or lightheadedness with hypotension: Yes Has patient had a PCN reaction causing severe rash involving mucus membranes or skin necrosis: No Has patient had a PCN reaction that required hospitalization No Has patient had a PCN reaction occurring within the last 10 years: No If all of the above answers are "NO", then may proceed with Cephalosporin use.  CHEST SIZED RASH AND ITCHING   . Propofol Other (See Comments)    "Breathing problems - asthma attack" Can take with benadryl   . Ace Inhibitors Cough  . Demerol [Meperidine]  Nausea And Vomiting  . Neosporin [Neomycin-Bacitracin Zn-Polymyx] Itching and Rash    MAKES REACTIONS WORSE WHEN USING AS PROPHYLACTIC  . Percocet [Oxycodone-Acetaminophen] Rash  . Tape Itching and Rash    SOCIAL HISTORY:  Social History   Tobacco Use  . Smoking status: Former Smoker    Packs/day: 0.00    Years: 41.00    Pack years: 0.00    Types: Cigarettes    Last attempt to quit: 03/05/2015    Years since quitting: 2.3  . Smokeless tobacco: Never Used  . Tobacco comment: 04/29/2015 "quit smoking cigarettes 02/27/2015"  Substance Use Topics  . Alcohol use: No    FAMILY HISTORY: Family History  Problem Relation Age of Onset  . Cancer Mother 62       bronchial cancer  . Breast cancer Mother   . Lung cancer Mother   . Hypertension Father   . COPD Father   . Heart disease Father 73       CAD with cardiac stenting  . Heart attack Father   . Parkinson's disease Father   . Allergies Sister   . Breast cancer Maternal Grandmother   . Emphysema Maternal Grandfather   . Leukemia Paternal Grandmother   . Emphysema Paternal Grandfather   . Thyroid disease Neg Hx     EXAM: BP 126/78   Pulse (!) 134   Resp 19   Ht 5\' 3"  (1.6 m)   Wt 81.2 kg (179 lb)   SpO2 99%   BMI 31.71 kg/m  CONSTITUTIONAL: Alert and oriented and responds appropriately to questions. Well-appearing; well-nourished HEAD: Normocephalic EYES: Conjunctivae clear, pupils appear equal, EOMI ENT: normal nose; moist mucous membranes; No pharyngeal erythema or petechiae, no tonsillar hypertrophy or exudate, no uvular deviation, no unilateral swelling, no trismus or drooling, no muffled voice, normal phonation, no stridor, no dental caries present, no drainable dental abscess noted, no Ludwig's angina, tongue sits flat in the bottom of the mouth, no angioedema, no facial erythema or warmth, no facial swelling; no pain with movement of the neck.  TMs are clear bilaterally without erythema, purulence, bulging,  perforation, effusion.  No cerumen impaction or sign of foreign body in the external auditory canal. No inflammation, erythema or drainage from the external auditory  canal. No signs of mastoiditis. No pain with manipulation of the pinna bilaterally. NECK: Supple, no meningismus, no nuchal rigidity, no LAD  CARD: Irregularly irregular and tachycardic; S1 and S2 appreciated; no murmurs, no clicks, no rubs, no gallops RESP: Normal chest excursion without splinting or tachypnea; breath sounds clear and equal bilaterally; no wheezes, no rhonchi, no rales, no hypoxia or respiratory distress, speaking full sentences ABD/GI: Normal bowel sounds; non-distended; soft, non-tender, no rebound, no guarding, no peritoneal signs, no hepatosplenomegaly BACK:  The back appears normal and is non-tender to palpation, there is no CVA tenderness EXT: Normal ROM in all joints; non-tender to palpation; no edema; normal capillary refill; no cyanosis, no calf tenderness or swelling    SKIN: Normal color for age and race; warm; no rash NEURO: Moves all extremities equally PSYCH: The patient's mood and manner are appropriate. Grooming and personal hygiene are appropriate.  MEDICAL DECISION MAKING: Patient here with A. fib with RVR.  Unfortunate this time she is not a candidate for cardioversion as she reports that she has not been compliant with her Eliquis in the past 30 days.  States over the past week she thinks she has missed several doses.  I feel she would be too high risk for stroke with cardioversion without being properly anticoagulated.  She agrees with this plan.  We will start IV diltiazem.  Will obtain cardiac labs, check electrolytes and hemoglobin.  Patient will need admission  ED PROGRESS: Patient has hyperglycemia without DKA.  Reports allergy to insulin.  Will give IV fluid bolus.  Potassium of 3.5.  Will give oral replacement to optimize this level.  Magnesium is 1.4.  Will give 2 g IV.  Troponin is negative.   Chest x-ray clear.  Heart rate still in the 130s on diltiazem.  Will discuss with medicine for admission.  Chest pain is improving.   7:34 AM Discussed patient's case with hospitalist, Erin Hearing, NP.  I have recommended admission and patient (and family if present) agree with this plan. Admitting physician will place admission orders.   I reviewed all nursing notes, vitals, pertinent previous records, EKGs, lab and urine results, imaging (as available).      EKG Interpretation  Date/Time:  Monday July 04 2017 06:16:21 EST Ventricular Rate:  156 PR Interval:    QRS Duration: 105 QT Interval:  320 QTC Calculation: 516 R Axis:   -73 Text Interpretation:  Atrial fibrillation with rapid V-rate Inferior infarct, old Anterior infarct, old Lateral leads are also involved Confirmed by Ayssa Bentivegna, Cyril Mourning [phone]) on 07/04/2017 6:28:13 AM       CRITICAL CARE Performed by: Cyril Mourning Pura Picinich   Total critical care time: 45 minutes  Critical care time was exclusive of separately billable procedures and treating other patients.  Critical care was necessary to treat or prevent imminent or life-threatening deterioration.  Critical care was time spent personally by me on the following activities: development of treatment plan with patient and/or surrogate as well as nursing, discussions with consultants, evaluation of patient's response to treatment, examination of patient, obtaining history from patient or surrogate, ordering and performing treatments and interventions, ordering and review of laboratory studies, ordering and review of radiographic studies, pulse oximetry and re-evaluation of patient's condition.    Magin Balbi, Delice Bison, DO 07/04/17 [phone]

## 2017-07-04 NOTE — ED Triage Notes (Signed)
Pt BIB [facility] for afib RVR. Pt woke up at 0500 to go to the bathroom. Pt felt like she went into a fib. 160-170 on arrival. 5mg  lopressor given IV. SHOB and nausea.

## 2017-07-04 NOTE — H&P (Signed)
History and Physical    Judith [name] CWC:[id number] DOB: 02-Oct-1950 DOA: 07/04/2017  **Will admit patient based on the expectation that the patient will need hospitalization/ hospital care that crosses at least 2 midnights  PCP: Isla Pence  Attending physician: Lorin Mercy  Patient coming from/Resides with: Private residence/sister  Chief Complaint: Symptomatic atrial fibrillation with RVR  HPI: Judith [name] is a 67 y.o. female with medical history significant for diabetes mellitus on oral agents and previously on long-acting insulin, hypertension, dyslipidemia, known CAD with DES to left Cfx/OM in 2016 on DAPT, hyperthyroidism on Tapazole, diabetic peripheral neuropathy and history of right transmetatarsal amputation.  Patient awakened at 5 AM to go to the bathroom and while seated on the toilet she developed palpitations and chest discomfort.  Associated with shortness of breath and nausea.  She called out to her sister who notified EMS.  She was given 5 mg of Lopressor IV in route to the hospital.  Since arrival she has been started on Cardizem infusion at 5 mg/h with improvement in heart rate down in the 70s.  Since arrival she did have short burst of wide-complex tachycardia that was irregular and appears consistent with aberrantly conducted atrial fibrillation.  Patient later admitted that during episode of tachyarrhythmia she took an extra dose of sotalol prior to arrival.  ED Course:  Vital Signs: BP 126/78   Pulse (!) 58   Resp 19   Ht 5\' 3"  (1.6 m)   Wt 81.2 kg (179 lb)   SpO2 99%   BMI 31.71 kg/m  CXR: Neg Lab data: Sodium 132, potassium 3.5, chloride 95, CO2 24, glucose 331, BUN 15, creatinine 0.74, calcium 8.7, anion gap 13, magnesium 1.4, poc troponin negative, white count 7800 differential not obtained, hemoglobin 12.6, platelets 427,000 Medications and treatments: Cardizem infusion 10 mg bolus followed by drip at 5 mg/hr, aspirin 324 mg x1  Review of  Systems:  In addition to the HPI above,  No Fever-chills, myalgias or other constitutional symptoms No Headache, changes with Vision or hearing, new weakness, tingling, numbness in any extremity, dysarthria or word finding difficulty, gait disturbance or imbalance, tremors or seizure activity No problems swallowing food or Liquids, indigestion/reflux, choking or coughing while eating, abdominal pain with or after eating No Cough, orthopnea or DOE No Abdominal pain, N/V, melena,hematochezia, dark tarry stools, constipation No dysuria, malodorous urine, hematuria or flank pain No new skin rashes, lesions, masses or bruises, No new joint pains, aches, swelling or redness No recent unintentional weight gain or loss No polyuria, polydypsia or polyphagia   Past Medical History:  Diagnosis Date  . Abnormal EKG 07/31/2013  . Arthritis    "hands" (03/06/2015)  . Asthma   . Carpal tunnel syndrome, bilateral   . Chronic kidney disease   . Complication of anesthesia    slow to wake up  . Coronary artery disease     2 v CAD with CTO of the RCA and high grade bifurcational LCx/OM stenosis. S/P PCI DES x 2 to the LCx/OM.  Marland Kitchen Diabetic peripheral neuropathy ([facility]) "since 1996"  . GERD (gastroesophageal reflux disease)   . Goiter   . Headache    migraines prior to menopause  . History of shingles 06/01/2013  . Hyperlipidemia LDL goal <70 10/13/2015  . Hypertension   . Hyperthyroidism   . PAF (paroxysmal atrial fibrillation) ([facility]) 04/29/2015   CHADS2VASC score of 4 now on Apixaban  . Pneumonia ~ 1976  . Tremors of nervous system   .  Type II diabetes mellitus (H[facility])    insulin dependent    Past Surgical History:  Procedure Laterality Date  . ABDOMINAL HYSTERECTOMY  1988   age 26; CERVICAL DYSPLASIA; ovaries intact.   . AMPUTATION Right 01/23/2016   Procedure: Right 3rd Ray Amputation;  Surgeon: [name], MD;  Location: [facility];  Service: Orthopedics;  Laterality: Right;  . AMPUTATION Right  02/13/2016   Procedure: Right Transmetatarsal Amputation;  Surgeon: [name], MD;  Location: [facility];  Service: Orthopedics;  Laterality: Right;  . CARDIAC CATHETERIZATION N/A 02/27/2015   Procedure: Left Heart Cath and Coronary Angiography;  Surgeon: [name], MD; LAD 40%, mCFX 80%, OM 70%, RCA 100% calcified       . CARDIAC CATHETERIZATION N/A 03/06/2015   Procedure: Coronary Stent Intervention;  Surgeon: [name], MD;  Location: [facility];  Service: Cardiovascular;  Laterality: N/A;  Mid CX 3.50x12 promus DES w/ 0% resdual and Prox OM1 2.50x20 promus DES w/ 20% residual  . CARPAL TUNNEL RELEASE Right Nov 2015  . CARPAL TUNNEL RELEASE Right 1992; 05/2014   [name]; [place]  . CESAREAN SECTION  1982; 1984  . FOOT NEUROMA SURGERY Bilateral 2000  . KNEE ARTHROSCOPY Right ~ 2003   "meniscus repair"  . SHOULDER OPEN ROTATOR CUFF REPAIR Right 1996; 1998   "w/fracture repair"  . THYROID SURGERY  2000   "removed lots of nodules"  . TONSILLECTOMY  1976    Social History   Socioeconomic History  . Marital status: Single    Spouse name: Not on file  . Number of children: 2  . Years of education: Masters  . Highest education level: Not on file  Social Needs  . Financial resource strain: Not on file  . Food insecurity - worry: Not on file  . Food insecurity - inability: Not on file  . Transportation needs - medical: Not on file  . Transportation needs - non-medical: Not on file  Occupational History  . Occupation: Landscape architect  Tobacco Use  . Smoking status: Former Smoker    Packs/day: 0.00    Years: 41.00    Pack years: 0.00    Types: Cigarettes    Last attempt to quit: 03/05/2015    Years since quitting: 2.3  . Smokeless tobacco: Never Used  . Tobacco comment: 04/29/2015 "quit smoking cigarettes 02/27/2015"  Substance and Sexual Activity  . Alcohol use: No  . Drug use: No  . Sexual activity: Not Currently    Birth control/protection:  Post-menopausal, Surgical  Other Topics Concern  . 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      Lives with sister in [place].      Tobacco: 1 ppd x 41 years - quit 2016      Alcohol: none      Drugs: none      Exercise:  Walking in neighborhood; physical job.   Right-handed.   2 cups caffeine daily.    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 Dilaudid [Hydromorphone Hcl] Other (See Comments)    HEADACHE  . Novolog [Insulin Aspart] Shortness Of  Breath    "breathing problems"  . Codeine Nausea And Vomiting    HIGH DOSES-SEVERE VOMITING  . Iodine Other (See Comments)    MUST HAVE BENADRYL PRIOR TO PROCEDURE AND RIGHT BEFORE TREATMENT TO COUNTERACT REACTION-BLISTERING REACTION DERMATOLOGICAL  . Penicillins Itching, Rash and Other (See Comments)    Has patient had a PCN reaction causing immediate rash, facial/tongue/throat swelling, SOB or lightheadedness with hypotension: Yes Has patient had a PCN reaction causing severe rash involving mucus membranes or skin necrosis: No Has patient had a PCN reaction that required hospitalization No Has patient had a PCN reaction occurring within the last 10 years: No If all of the above answers are "NO", then may proceed with Cephalosporin use.  CHEST SIZED RASH AND ITCHING   . Propofol Other (See Comments)    "Breathing problems - asthma attack" Can take with benadryl   . Ace Inhibitors Cough  . Demerol [Meperidine] Nausea And Vomiting  . Neosporin [Neomycin-Bacitracin Zn-Polymyx] Itching and Rash    MAKES REACTIONS WORSE WHEN USING AS PROPHYLACTIC  . Percocet [Oxycodone-Acetaminophen]  Rash  . Tape Itching and Rash    Family History  Problem Relation Age of Onset  . Cancer Mother 26       bronchial cancer  . Breast cancer Mother   . Lung cancer Mother   . Hypertension Father   . COPD Father   . Heart disease Father 2       CAD with cardiac stenting  . Heart attack Father   . Parkinson's disease Father   . Allergies Sister   . Breast cancer Maternal Grandmother   . Emphysema Maternal Grandfather   . Leukemia Paternal Grandmother   . Emphysema Paternal Grandfather   . Thyroid disease Neg Hx      Prior to Admission medications   Medication Sig Start Date End Date Taking? Authorizing Provider  albuterol (PROVENTIL HFA) 108 (90 Base) MCG/ACT inhaler Inhale 1-2 puffs into the lungs every 6 (six) hours as needed for wheezing or shortness of breath. 11/07/15  Yes [name], MD  amLODipine (NORVASC) 5 MG tablet TAKE ONE TABLET BY MOUTH ONCE DAILY 05/11/17  Yes Barrett, Evelene Croon, PA-C  aspirin 81 MG chewable tablet Chew 324 mg by mouth once.   Yes [provider]  atorvastatin (LIPITOR) 20 MG tablet Take 1 tablet (20 mg total) by mouth daily. 04/22/15  Yes Richardson Dopp T, PA-C  buPROPion [facility] SR) 150 MG 12 hr tablet TAKE ONE TABLET BY MOUTH TWICE DAILY 03/16/17  Yes [name], PA-C  clopidogrel (PLAVIX) 75 MG tablet Take 1 tablet (75 mg total) by mouth daily. Please make yearly appt with Dr. Radford Pax for May before anymore refills. 1st attempt 05/12/17  Yes Turner, Eber Hong, MD  ELIQUIS 5 MG TABS tablet TAKE ONE TABLET BY MOUTH TWICE DAILY 05/12/17  Yes Turner, Eber Hong, MD  fenofibrate (TRICOR) 145 MG tablet Take 1 tablet (145 mg total) by mouth daily. 09/27/16 09/22/17 Yes Turner, Eber Hong, MD  FLUoxetine (PROZAC) 20 MG capsule TAKE ONE CAPSULE BY MOUTH AT BEDTIME 03/16/17  Yes [name], PA-C  fluticasone (FLOVENT HFA) 44 MCG/ACT inhaler Inhale 2 puffs into the lungs 2 (two) times daily as needed (shortness of breath/ wheezing).    Yes [provider]  gabapentin (NEURONTIN) 300 MG capsule Take 3 capsules (900 mg total) by mouth 2 (two) times daily. TAKE 3 CAPSULES BY MOUTH EVERY  MORNING AND 3 CAPSULES AT BEDTIME Patient  taking differently: Take 900 mg by mouth 2 (two) times daily.  12/29/16  Yes [name], PA-C  insulin regular (NOVOLIN R RELION) 100 units/mL injection Inject 15-25-10 units three times daily just before each meal. Patient taking differently: Inject 10-25 Units into the skin See admin instructions. Inject 15 units every morning then use 25 units at lunch and then 10 units at supper 01/21/17  Yes Renato Shin, MD  losartan (COZAAR) 50 MG tablet TAKE ONE TABLET BY MOUTH ONCE DAILY Patient taking differently: TAKE 50 MG BY MOUTH ONCE DAILY 04/05/16  Yes Turner, Eber Hong, MD  metFORMIN (GLUCOPHAGE-XR) 500 MG 24 hr tablet Take 4 tablets (2,000 mg total) daily with breakfast by mouth. Patient taking differently: Take 1,000 mg by mouth 2 (two) times daily.  03/25/17  Yes Renato Shin, MD  methimazole (TAPAZOLE) 10 MG tablet TAKE ONE TABLET BY MOUTH ONCE DAILY 05/11/17  Yes Renato Shin, MD  metoprolol succinate (TOPROL-XL) 25 MG 24 hr tablet TAKE ONE TABLET BY MOUTH TWICE DAILY 12/14/16  Yes Turner, Eber Hong, MD  nitroGLYCERIN (NITROSTAT) 0.4 MG SL tablet Place 1 tablet (0.4 mg total) under the tongue every 5 (five) minutes as needed for chest pain. 02/10/17  Yes [name], PA-C  pantoprazole (PROTONIX) 40 MG tablet Take 1 tablet (40 mg total) by mouth daily. 12/29/16  Yes [name], PA-C  silver sulfADIAZINE (SILVADENE) 1 % cream Apply 1 application topically daily. 07/01/17  Yes Trula Slade, DPM  sotalol (BETAPACE) 80 MG tablet TAKE ONE TABLET BY MOUTH EVERY 12 HOURS 12/14/16  Yes Barrett, Evelene Croon, PA-C  traMADol (ULTRAM) 50 MG tablet Take 1 tablet (50 mg total) by mouth every 8 (eight) hours as needed. Patient taking differently: Take 50 mg by mouth every 6 (six) hours as needed for moderate pain.  05/13/17   Yes [name], MD  TRESIBA FLEXTOUCH 100 UNIT/ML SOPN FlexTouch Pen INJECT 20 UNITS SUBCUTANEOUSLY AT BEDTIME 07/01/17  Yes Renato Shin, MD  clotrimazole-betamethasone (LOTRISONE) cream Apply 1 application topically 2 (two) times daily. Patient not taking: Reported on 06/04/2017 02/10/17   [name], PA-C  cyclobenzaprine (FLEXERIL) 5 MG tablet Take 1 tablet (5 mg total) by mouth 3 (three) times daily as needed. for muscle spams Patient not taking: Reported on 06/04/2017 02/10/17   [name], PA-C  fluconazole (DIFLUCAN) 100 MG tablet Take Diflucan 100 mg PO daily for 10 days Patient not taking: Reported on 06/04/2017 11/05/16   Mauri Pole, MD  insulin aspart (NOVOLOG) 100 UNIT/ML injection Inject 15 units at breakfast 25 units at lunch and 10 units at dinner Patient not taking: Reported on 06/04/2017 02/01/17   Renato Shin, MD  Insulin Pen Needle 32G X 4 MM MISC Use to inject insulin daily 08/13/16   Renato Shin, MD  Insulin Syringe-Needle U-100 (INSULIN SYRINGE .5CC/31GX5/16") 31G X 5/16" 0.5 ML MISC Use to inject insulin 08/13/16   Renato Shin, MD  ondansetron (ZOFRAN) 4 MG tablet Take 1 tablet (4 mg total) by mouth every 8 (eight) hours as needed for nausea or vomiting. Patient not taking: Reported on 06/04/2017 10/21/16   Mauri Pole, MD  SSD 1 % cream APPLY TO AFFECTED AREA ONCE DAILY Patient not taking: Reported on 06/04/2017 03/11/17   [name], MD    Physical Exam: Vitals:   07/04/17 0615 07/04/17 0618 07/04/17 0619 07/04/17 0700  BP:    126/78  Pulse:    (!) 58  Resp:  15  19  SpO2: 100% 99%  99%  Weight:   81.2 kg (179 lb)   Height:   5\' 3"  (1.6 m)       Constitutional: NAD, calm, comfortable Eyes: PERRL, lids and conjunctivae normal ENMT: Mucous membranes are dry. Posterior pharynx clear of any exudate or lesions. Poor dentition.  Neck: normal, supple, no masses, no thyromegaly Respiratory: clear to auscultation bilaterally, no wheezing, no  crackles. Normal respiratory effort. No accessory muscle use.  Cardiovascular: Regular rhythm/atrial fib has converted to sinus rhythm, no murmurs / rubs / gallops. No extremity edema. 2+ pedal pulses. No carotid bruits.  Abdomen: no tenderness, no masses palpated. No hepatosplenomegaly. Bowel sounds positive.  Musculoskeletal: no clubbing / cyanosis. No joint deformity upper and lower extremities. Good ROM, no contractures. Normal muscle tone.  Prior right transmetatarsal amputation; patient has a chronic linear/oblique wound left great toe followed by orthopedic physician-no signs of cellulitic change, no drainage Skin: no rashes, lesions, ulcers. No induration Neurologic: CN 2-12 grossly intact. Sensation intact, DTR normal. Strength 5/5 x all 4 extremities.  Psychiatric: Normal judgment and insight. Alert and oriented x 3. Normal mood.    Labs on Admission: I have personally reviewed following labs and imaging studies  CBC: Recent Labs  Lab 07/04/17 0619 07/04/17 0652  WBC 7.8  --   HGB 12.6 13.3  HCT 37.5 39.0  MCV 87.2  --   PLT 427*  --    Basic Metabolic Panel: Recent Labs  Lab 07/04/17 0619 07/04/17 0652  NA 132* 135  K 3.5 3.5  CL 95* 96*  CO2 24  --   GLUCOSE 331* 334*  BUN 15 17  CREATININE 0.74 0.60  CALCIUM 8.7*  --   MG 1.4*  --    GFR: Estimated Creatinine Clearance: 70.7 mL/min (by C-G formula based on SCr of 0.6 mg/dL). Liver Function Tests: No results for input(s): AST, ALT, ALKPHOS, BILITOT, PROT, ALBUMIN in the last 168 hours. No results for input(s): LIPASE, AMYLASE in the last 168 hours. No results for input(s): AMMONIA in the last 168 hours. Coagulation Profile: No results for input(s): INR, PROTIME in the last 168 hours. Cardiac Enzymes: No results for input(s): CKTOTAL, CKMB, CKMBINDEX, TROPONINI in the last 168 hours. BNP (last 3 results) No results for input(s): PROBNP in the last 8760 hours. HbA1C: No results for input(s): HGBA1C in the  last 72 hours. CBG: No results for input(s): GLUCAP in the last 168 hours. Lipid Profile: No results for input(s): CHOL, HDL, LDLCALC, TRIG, CHOLHDL, LDLDIRECT in the last 72 hours. Thyroid Function Tests: No results for input(s): TSH, T4TOTAL, FREET4, T3FREE, THYROIDAB in the last 72 hours. Anemia Panel: No results for input(s): VITAMINB12, FOLATE, FERRITIN, TIBC, IRON, RETICCTPCT in the last 72 hours. Urine analysis:    Component Value Date/Time   COLORURINE YELLOW 05/10/2016 0110   APPEARANCEUR HAZY (A) 05/10/2016 0110   LABSPEC 1.030 05/10/2016 0110   PHURINE 5.0 05/10/2016 0110   GLUCOSEU 150 (A) 05/10/2016 0110   HGBUR NEGATIVE 05/10/2016 0110   BILIRUBINUR NEGATIVE 05/10/2016 0110   BILIRUBINUR Small 12/06/2014 1441   KETONESUR 5 (A) 05/10/2016 0110   PROTEINUR >=300 (A) 05/10/2016 0110   UROBILINOGEN 1.0 02/12/2015 1238   NITRITE NEGATIVE 05/10/2016 0110   LEUKOCYTESUR TRACE (A) 05/10/2016 0110   Sepsis Labs: @LABRCNTIP (procalcitonin:4,lacticidven:4) )No results found for this or any previous visit (from the past 240 hour(s)).   Radiological Exams on Admission: Dg Chest Port 1 View  Result Date: 07/04/2017 CLINICAL  DATA:  67 year old female with atrial fibrillation. Shortness of breath and nausea. Initial encounter. EXAM: PORTABLE CHEST 1 VIEW COMPARISON:  06/04/2017, 08/06/2016 and 08/15/2015 chest x-ray. FINDINGS: No infiltrate, congestive heart failure or pneumothorax. 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 Cardiomegaly. Calcified minimally tortuous aorta. No plain film evidence of pulmonary malignancy. IMPRESSION: No acute cardiopulmonary abnormality. Cardiomegaly. Aortic Atherosclerosis (ICD10-I70.0). 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 Electronically Signed   By: Genia Del M.D.   On: 07/04/2017 07:04    EKG:  (Independently reviewed) atrial fibrillation with RVR with a ventricular rate of 156 bpm, QTC 516 ms, peaked T waves in the context of rapid rate, no acute ischemic change  Assessment/Plan Principal Problem:   Atrial fibrillation with RVR  -Patient presents with symptomatic A. fib RVR and has converted after arrival -Blood pressure somewhat suboptimal so holding home antihypertensive agents in favor of Betapace and shorter acting metoprolol may require up titration -On very low-dose Cardizem infusion and likely can discontinue in favor of oral agents as above -Patient has had issues with hyperglycemia and appropriate antihyperglycemic agents (see below) and suspect dehydration may be contributing to arrhythmia-obtain OVS-may require gentle IV fluid hydration but currently will encourage oral intake of fluids -Echocardiogram -Magnesium also low so will replace -Potassium also less than 4.0 so will replace since can also contribute to arrhythmia -Continue Eliquis-patient reported to EDP noncompliance due to a variety of complaints primarily due to forgetfulness; suggestions made on utilization of medication box lifting clear view as well as using reminders on smart phone -CHA2DS2-VASc=5  Active Problems:   Wide-complex tachycardia/hypomagnesemia -After arrival RN captured a telemetry strip with a 7-8 beat run of an irregular wide complex tachycardia that appears consistent with aberrantly conducted atrial fibrillation -Magnesium 1.4 and replaced by EDP -K+ 3.5 so we will give at least a one-time oral dose -Follow labs -Of note, repeat EKG while in sinus rhythm with normalized QTC 482 therefore no indication to hold or adjust Betapace    Type II diabetes mellitus, uncontrolled  -Presents with CBGs greater than 300 with normal anion gap -Continue metformin with SSI -Patient reports due to insurance difficulties she has not been consistently on long-acting insulin noting she prefers to receive a  but has been on Lantus in the past -CM to assist with determining best medicine co-pay-patient reports has had an additional change in insurance based on eligibility for Medicare/Social Security -HgbA1c    Hypertension -Daughter and Norvasc in favor of AVN talking agent as above    Coronary artery disease -Cardiac catheterization 2016 with normal LV function with DES to left cfx/om on DAPT -Continue Plavix-??  Still on low-dose aspirin -SL NTG prn -Completeness of exam since was having some chest pain while tachycardic will cycle troponin -Echocardiogram    Peripheral neuropathy -Continue Neurontin    H/O hyperthyroidism -Tapazole -TSH    HLD (hyperlipidemia) -Continue Lipitor and TriCor    **Additional lab, imaging and/or diagnostic evaluation at discretion of supervising physician  DVT prophylaxis: Eliquis Code Status: Full Family Communication: Sister Disposition Plan: Home Consults called: None    Yazleemar Strassner L. ANP-BC [facility] Pager [phone]   If 7PM-7AM, please contact night-coverage [email or web address] Password [facility]  07/04/2017, 8:15 AM

## 2017-07-04 NOTE — ED Notes (Signed)
Ordered dinner tray.  

## 2017-07-04 NOTE — Progress Notes (Signed)
Subjective: Britani presents the office today for concerns of her left big toe splitting open again she states is worse than what it was previously.  She was doing well however she noticed blood this morning.  She denies any swelling or redness or any pus.  No recent injury.  She has no other concerns today. Denies any systemic complaints such as fevers, chills, nausea, vomiting. No acute changes since last appointment, and no other complaints at this time.   Objective: AAO x3, NAD DP/PT pulses palpable bilaterally, CRT less than 3 seconds Right transmetatarsal amputation.  On the left hallux distal medial aspect there is a skin fissure.  Upon debridement there is a granular wound present but there is no probing, undermining or tunneling.  There is no swelling erythema, ascending cellulitis there is no edema to the toe.  No purulence. Signs of infection is noted. No open lesions or pre-ulcerative lesions.  No pain with calf compression, swelling, warmth, erythema      Assessment: Left hallux skin fissure, wound  Plan: -All treatment options discussed with the patient including all alternatives, risks, complications.  -I did sharply debride the skin fissure, wound of all hyperkeratotic tissue to healthy, bleeding tissue.  There is no signs of infection.  I also debrided the toenail today.  Recommend continue with Silvadene dressing changes.  She states that she has this at home.  Offloading at all times. Monitor for any clinical signs or symptoms of infection and directed to call the office immediately should any occur or go to the ER. -Follow-up in 1 week or sooner if needed.  Call any questions or concerns.  Trula Slade DPM

## 2017-07-04 NOTE — ED Notes (Signed)
Patient denies any complaints , family member concerned patient is sleepier than her norm. Patient states she is just tired.

## 2017-07-04 NOTE — ED Notes (Signed)
Patient CBG was 323, Nurse Santiago Glad was informed.

## 2017-07-05 ENCOUNTER — Encounter (HOSPITAL_COMMUNITY): Payer: Self-pay | Admitting: General Practice

## 2017-07-05 ENCOUNTER — Telehealth: Payer: Self-pay

## 2017-07-05 ENCOUNTER — Observation Stay (HOSPITAL_BASED_OUTPATIENT_CLINIC_OR_DEPARTMENT_OTHER): Payer: Medicare PPO

## 2017-07-05 DIAGNOSIS — I2510 Atherosclerotic heart disease of native coronary artery without angina pectoris: Secondary | ICD-10-CM | POA: Diagnosis not present

## 2017-07-05 DIAGNOSIS — I4891 Unspecified atrial fibrillation: Secondary | ICD-10-CM

## 2017-07-05 DIAGNOSIS — I472 Ventricular tachycardia: Secondary | ICD-10-CM | POA: Diagnosis not present

## 2017-07-05 DIAGNOSIS — E785 Hyperlipidemia, unspecified: Secondary | ICD-10-CM

## 2017-07-05 DIAGNOSIS — E1165 Type 2 diabetes mellitus with hyperglycemia: Secondary | ICD-10-CM

## 2017-07-05 DIAGNOSIS — I10 Essential (primary) hypertension: Secondary | ICD-10-CM

## 2017-07-05 DIAGNOSIS — R739 Hyperglycemia, unspecified: Secondary | ICD-10-CM

## 2017-07-05 DIAGNOSIS — R0683 Snoring: Secondary | ICD-10-CM

## 2017-07-05 DIAGNOSIS — Z8639 Personal history of other endocrine, nutritional and metabolic disease: Secondary | ICD-10-CM

## 2017-07-05 LAB — BASIC METABOLIC PANEL
ANION GAP: 10 (ref 5–15)
BUN: 23 mg/dL — AB (ref 6–20)
CO2: 25 mmol/L (ref 22–32)
Calcium: 9.2 mg/dL (ref 8.9–10.3)
Chloride: 101 mmol/L (ref 101–111)
Creatinine, Ser: 0.89 mg/dL (ref 0.44–1.00)
Glucose, Bld: 222 mg/dL — ABNORMAL HIGH (ref 65–99)
POTASSIUM: 4.6 mmol/L (ref 3.5–5.1)
SODIUM: 136 mmol/L (ref 135–145)

## 2017-07-05 LAB — GLUCOSE, CAPILLARY
GLUCOSE-CAPILLARY: 198 mg/dL — AB (ref 65–99)
Glucose-Capillary: 312 mg/dL — ABNORMAL HIGH (ref 65–99)
Glucose-Capillary: 199 mg/dL — ABNORMAL HIGH (ref 65–99)

## 2017-07-05 LAB — ECHOCARDIOGRAM COMPLETE
HEIGHTINCHES: 64 in
Weight: 2833.6 oz

## 2017-07-05 LAB — MAGNESIUM: Magnesium: 1.6 mg/dL — ABNORMAL LOW (ref 1.7–2.4)

## 2017-07-05 LAB — HEMOGLOBIN A1C
HEMOGLOBIN A1C: 9.2 % — AB (ref 4.8–5.6)
Mean Plasma Glucose: 217 mg/dL

## 2017-07-05 MED ORDER — MAGNESIUM OXIDE -MG SUPPLEMENT 200 MG PO TABS: 200.0000 mg | ORAL_TABLET | Freq: Two times a day (BID) | ORAL | 0 refills | Status: DC

## 2017-07-05 MED ORDER — INSULIN DEGLUDEC 100 UNIT/ML ~~LOC~~ SOPN: 20.0000 [IU] | PEN_INJECTOR | Freq: Every day | SUBCUTANEOUS | 0 refills | Status: DC

## 2017-07-05 MED ORDER — MAGNESIUM SULFATE 2 GM/50ML IV SOLN
2.0000 g | Freq: Once | INTRAVENOUS | Status: AC
  Administered 2017-07-05: 2 g via INTRAVENOUS
  Filled 2017-07-05: qty 50

## 2017-07-05 NOTE — Discharge Instructions (Signed)
Atrial Fibrillation Atrial fibrillation is a type of irregular or rapid heartbeat (arrhythmia). In atrial fibrillation, the heart quivers continuously in a chaotic pattern. This occurs when parts of the heart receive disorganized signals that make the heart unable to pump blood normally. This can increase the risk for stroke, heart failure, and other heart-related conditions. There are different types of atrial fibrillation, including:  Paroxysmal atrial fibrillation. This type starts suddenly, and it usually stops on its own shortly after it starts.  Persistent atrial fibrillation. This type often lasts longer than a week. It may stop on its own or with treatment.  Long-lasting persistent atrial fibrillation. This type lasts longer than 12 months.  Permanent atrial fibrillation. This type does not go away.  Talk with your health care provider to learn about the type of atrial fibrillation that you have. What are the causes? This condition is caused by some heart-related conditions or procedures, including:  A heart attack.  Coronary artery disease.  Heart failure.  Heart valve conditions.  High blood pressure.  Inflammation of the sac that surrounds the heart (pericarditis).  Heart surgery.  Certain heart rhythm disorders, such as Wolf-Parkinson-White syndrome.  Other causes include:  Pneumonia.  Obstructive sleep apnea.  Blockage of an artery in the lungs (pulmonary embolism, or PE).  Lung cancer.  Chronic lung disease.  Thyroid problems, especially if the thyroid is overactive (hyperthyroidism).  Caffeine.  Excessive alcohol use or illegal drug use.  Use of some medicines, including certain decongestants and diet pills.  Sometimes, the cause cannot be found. What increases the risk? This condition is more likely to develop in:  People who are older in age.  People who smoke.  People who have diabetes mellitus.  People who are overweight  (obese).  Athletes who exercise vigorously.  What are the signs or symptoms? Symptoms of this condition include:  A feeling that your heart is beating rapidly or irregularly.  A feeling of discomfort or pain in your chest.  Shortness of breath.  Sudden light-headedness or weakness.  Getting tired easily during exercise.  In some cases, there are no symptoms. How is this diagnosed? Your health care provider may be able to detect atrial fibrillation when taking your pulse. If detected, this condition may be diagnosed with:  An electrocardiogram (ECG).  A Holter monitor test that records your heartbeat patterns over a 24-hour period.  Transthoracic echocardiogram (TTE) to evaluate how blood flows through your heart.  Transesophageal echocardiogram (TEE) to view more detailed images of your heart.  A stress test.  Imaging tests, such as a CT scan or chest X-ray.  Blood tests.  How is this treated? The main goals of treatment are to prevent blood clots from forming and to keep your heart beating at a normal rate and rhythm. The type of treatment that you receive depends on many factors, such as your underlying medical conditions and how you feel when you are experiencing atrial fibrillation. This condition may be treated with:  Medicine to slow down the heart rate, bring the heart's rhythm back to normal, or prevent clots from forming.  Electrical cardioversion. This is a procedure that resets your heart's rhythm by delivering a controlled, low-energy shock to the heart through your skin.  Different types of ablation, such as catheter ablation, catheter ablation with pacemaker, or surgical ablation. These procedures destroy the heart tissues that send abnormal signals. When the pacemaker is used, it is placed under your skin to help your heart beat in   a regular rhythm.  Follow these instructions at home:  Take over-the counter and prescription medicines only as told by your  health care provider.  If your health care provider prescribed a blood-thinning medicine (anticoagulant), take it exactly as told. Taking too much blood-thinning medicine can cause bleeding. If you do not take enough blood-thinning medicine, you will not have the protection that you need against stroke and other problems.  Do not use tobacco products, including cigarettes, chewing tobacco, and e-cigarettes. If you need help quitting, ask your health care provider.  If you have obstructive sleep apnea, manage your condition as told by your health care provider.  Do not drink alcohol.  Do not drink beverages that contain caffeine, such as coffee, soda, and tea.  Maintain a healthy weight. Do not use diet pills unless your health care provider approves. Diet pills may make heart problems worse.  Follow diet instructions as told by your health care provider.  Exercise regularly as told by your health care provider.  Keep all follow-up visits as told by your health care provider. This is important. How is this prevented?  Avoid drinking beverages that contain caffeine or alcohol.  Avoid certain medicines, especially medicines that are used for breathing problems.  Avoid certain herbs and herbal medicines, such as those that contain ephedra or ginseng.  Do not use illegal drugs, such as cocaine and amphetamines.  Do not smoke.  Manage your high blood pressure. Contact a health care provider if:  You notice a change in the rate, rhythm, or strength of your heartbeat.  You are taking an anticoagulant and you notice increased bruising.  You tire more easily when you exercise or exert yourself. Get help right away if:  You have chest pain, abdominal pain, sweating, or weakness.  You feel nauseous.  You notice blood in your vomit, bowel movement, or urine.  You have shortness of breath.  You suddenly have swollen feet and ankles.  You feel dizzy.  You have sudden weakness or  numbness of the face, arm, or leg, especially on one side of the body.  You have trouble speaking, trouble understanding, or both (aphasia).  Your face or your eyelid droops on one side. These symptoms may represent a serious problem that is an emergency. Do not wait to see if the symptoms will go away. Get medical help right away. Call your local emergency services (911 in the [place] Do not drive yourself to the hospital. This information is not intended to replace advice given to you by your health care provider. Make sure you discuss any questions you have with your health care provider. Document Released: 04/26/2005 Document Revised: 09/03/2015 Document Reviewed: 08/21/2014 Elsevier Interactive Patient Education  2018 Elsevier Inc.  

## 2017-07-05 NOTE — Progress Notes (Signed)
  Echocardiogram 2D Echocardiogram has been performed.  Judith [name] 07/05/2017, 8:42 AM

## 2017-07-05 NOTE — Telephone Encounter (Signed)
Phone call to [place]. She states she would like primary care doctor to place order for outpatient sleep study. Provider, please place order. FYI, Patient is currently admitted to [facility].

## 2017-07-05 NOTE — Progress Notes (Signed)
Inpatient Diabetes Program Recommendations  AACE/ADA: New Consensus Statement on Inpatient Glycemic Control (2015)  Target Ranges:  Prepandial:   less than 140 mg/dL      Peak postprandial:   less than 180 mg/dL (1-2 hours)      Critically ill patients:  140 - 180 mg/dL   Lab Results  Component Value Date   GLUCAP 312 (H) 07/05/2017   HGBA1C 9.2 (H) 07/04/2017    Review of Glycemic Control  FBS - 222, 324. HgbA1C 9.2% - may benefit from tighter control. Post-prandials also elevated. Eating 100%.  Inpatient Diabetes Program Recommendations:      Add Lantus 12 units QHS Add Novolog 4 units tidwc for meal coverage insulin.  Will follow closely.  Thank you. Lorenda Peck, RD, LDN, CDE Inpatient Diabetes Coordinator [phone]

## 2017-07-05 NOTE — Care Management Note (Signed)
Case Management Note  Patient Details  Name: [name] MRN: [id number] Date of Birth: Mar 04, 1951  Subjective/Objective:    Atrial Fib               Action/Plan: Patient lives at home with her sister; PCP Dr Reginia Forts at [facility]; has private insurance with Medicare / [employer]arthur Rossetti with prescription drug coverage; pharmacy of choice is [facility]. Patient is ordered to have an Outpatient Sleep Study; CM was informed by [place] that only the PCP or [place] can order a Sleep Study due to issues with post hospitalization follow up. CM called Dr Zannie Cove PCP, message left with [facility] RN for PCP to order a Sleep Study.  Expected Discharge Date:     Possibly 07/05/2017             Expected Discharge Plan:  Home/Self Care  Discharge planning Services  CM Consult  Status of Service:  In process, will continue to follow  Sherrilyn Rist [phone] 07/05/2017, 2:51 PM

## 2017-07-05 NOTE — Telephone Encounter (Signed)
Copied from [place]. Topic: Referral - Request >> Jul 05, 2017  2:48 PM Arletha Grippe wrote: Reason for CRM:  [name] , [facility] mgr, called - the hospita docotr wants pt to have an outpatient sleep study.  Please call brenda at [phone] or pt at [phone]

## 2017-07-05 NOTE — Discharge Summary (Signed)
Physician Discharge Summary  Judith [name] ZJQ:[id number] DOB: 05-Aug-1950 DOA: 07/04/2017  PCP: No primary care provider on file.  Admit date: 07/04/2017 Discharge date: 07/05/2017  Time spent: 45 minutes  Recommendations for Outpatient Follow-up:  Patient will be discharged to home.  Patient will need to follow up with primary care provider within one week of discharge, repeat BMP and magnesium.  Follow up with cardiology in 1-2 weeks. Follow up with endocrinology, Dr. Loanne [name], in 1 week. Patient should continue medications as prescribed.  Patient should follow a heart healthy/carb modified diet.   Discharge Diagnoses:  Atrial fibrillation with RVR Wide complex tachycardia/hypomagnesemia Elevated troponin Diabetes mellitus, type II Essential hypertension Coronary artery disease Peripheral neuropathy Hyperthyroidism Hyperlipidemia Depression  Discharge Condition: Stable  Diet recommendation:  Heart healthy/carb modified  Filed Weights   07/04/17 0619 07/04/17 1903 07/05/17 0515  Weight: 81.2 kg (179 lb) 81.2 kg (179 lb) 80.3 kg (177 lb 1.6 oz)    History of present illness:  On 07/04/2017 by Ms. Judith Hearing, NP Judith [name] is a 67 y.o. female with medical history significant for diabetes mellitus on oral agents and previously on long-acting insulin, hypertension, dyslipidemia, known CAD with DES to left Cfx/OM in 2016 on DAPT, hyperthyroidism on Tapazole, diabetic peripheral neuropathy and history of right transmetatarsal amputation.  Patient awakened at 5 AM to go to the bathroom and while seated on the toilet she developed palpitations and chest discomfort.  Associated with shortness of breath and nausea.  She called out to her sister who notified EMS.  She was given 5 mg of Lopressor IV in route to the hospital.  Since arrival she has been started on Cardizem infusion at 5 mg/h with improvement in heart rate down in the 70s.  Since arrival she did have short burst of wide-complex  tachycardia that was irregular and appears consistent with aberrantly conducted atrial fibrillation.  Patient later admitted that during episode of tachyarrhythmia she took an extra dose of sotalol prior to arrival.  Hospital Course:  Atrial fibrillation with RVR -Patient was symptomatic when she presented to the emergency department, she was then started on Cardizem drip and converted to norma sinus rhythm -secondary to unknown etiology, possibly K 3.5, magnesium 1.4 on admission -Currently rate and rhythm controlled -Continue Eliquis, metoprolol, sotalol  Wide complex tachycardia/hypomagnesemia -Resolved -Upon arrival to the emergency department, patient had 7-8 beat run of an irregular wide complex tachycardia with aberrant conduction atrial fibrillation -Magnesium was 1.4 and replaced, currently 1.6- patient given additional supplementation prior to discharge and discharged with mag ox supplementation -Potassium was 3.5 and replaced, currently 4.6 -Repeat BMP and magnesium in one week  Elevated troponin -likely secondary to the above, suspect demand -chest pain free  Diabetes mellitus, type II -Patient on metformin at home  -was placed on insulin sliding scale -Patient has had insurance difficulties obtaining long-acting insulin -Case management consulted  -hemoglobin A1c 9.2 -Follow up with Dr. Loanne [name], endocrinology   Essential hypertension -continue metoprolol, betapacce -losartan and amlodipine held  Coronary artery disease -Patient had catheterization in 2016 which showed normal left ventricular function with DES to the left circumflex/OM on DAPT -Continue Plavix, aspirin- follow up with cardiology regarding DAPT -Echocardiogram obtained EF 55-60%, no RWMA  Peripheral neuropathy -Continue Neurontin  Hyperthyroidism -Continue Tapazole -TSH 0.897  Hyperlipidemia -Continue statin, TriCor  Depression -Continue prozac,  wellbutrin  Procedures: Echocardiogram  Consultations: None   Discharge Exam: Vitals:   07/05/17 0900 07/05/17 1136  BP: (!) 156/60 90/60  Pulse:  74 70  Resp:  16  Temp:  97.8 F (36.6 C)  SpO2: 98% 98%   Patient seen and examined.  Denies current chest pain, shortness of breath, abdominal pain, nausea or vomiting, diarrhea constipation, dizziness or headache.  Denies further palpitations.   General: Well developed, well nourished, NAD, appears stated age  HEENT: NCAT, PERRLA, EOMI, Anicteic Sclera, mucous membranes moist.  Neck: Supple  Cardiovascular: S1 S2 auscultated, no rubs, murmurs or gallops. Regular rate and rhythm.  Respiratory: Clear to auscultation bilaterally with equal chest rise  Abdomen: Soft, nontender, nondistended, + bowel sounds  Extremities: warm dry without cyanosis clubbing or edema  Neuro: AAOx3, nonfocal  Psych: Normal affect and demeanor with intact judgement and insight  Discharge Instructions Discharge Instructions    Amb referral to [facility]   Complete by:  As directed    Discharge instructions   Complete by:  As directed    Patient will be discharged to home.  Patient will need to follow up with primary care provider within one week of discharge, repeat BMP and magnesium.  Follow up with cardiology in 1-2 weeks. Follow up with endocrinology, Dr. Loanne [name], in 1 week. Patient should continue medications as prescribed.  Patient should follow a heart healthy/carb modified diet.     Allergies as of 07/05/2017      Reactions   Dilaudid [hydromorphone Hcl] Other (See Comments)   HEADACHE   Novolog [insulin Aspart] Shortness Of Breath   "breathing problems"   Codeine Nausea And Vomiting   HIGH DOSES-SEVERE VOMITING   Iodine Other (See Comments)   MUST HAVE BENADRYL PRIOR TO PROCEDURE AND RIGHT BEFORE TREATMENT TO COUNTERACT REACTION-BLISTERING REACTION DERMATOLOGICAL   Penicillins Itching, Rash, Other (See Comments)   Has patient had a  PCN reaction causing immediate rash, facial/tongue/throat swelling, SOB or lightheadedness with hypotension: Yes Has patient had a PCN reaction causing severe rash involving mucus membranes or skin necrosis: No Has patient had a PCN reaction that required hospitalization No Has patient had a PCN reaction occurring within the last 10 years: No If all of the above answers are "NO", then may proceed with Cephalosporin use. CHEST SIZED RASH AND ITCHING   Propofol Other (See Comments)   "Breathing problems - asthma attack" Can take with benadryl   Ace Inhibitors Cough   Demerol [meperidine] Nausea And Vomiting   Neosporin [neomycin-bacitracin Zn-polymyx] Itching, Rash   MAKES REACTIONS WORSE WHEN USING AS PROPHYLACTIC   Percocet [oxycodone-acetaminophen] Rash   Tape Itching, Rash      Medication List    STOP taking these medications   amLODipine 5 MG tablet Commonly known as:  NORVASC   clotrimazole-betamethasone cream Commonly known as:  LOTRISONE   cyclobenzaprine 5 MG tablet Commonly known as:  FLEXERIL   fluconazole 100 MG tablet Commonly known as:  DIFLUCAN   insulin aspart 100 UNIT/ML injection Commonly known as:  novoLOG   losartan 50 MG tablet Commonly known as:  COZAAR   ondansetron 4 MG tablet Commonly known as:  ZOFRAN     TAKE these medications   albuterol 108 (90 Base) MCG/ACT inhaler Commonly known as:  PROVENTIL HFA Inhale 1-2 puffs into the lungs every 6 (six) hours as needed for wheezing or shortness of breath.   aspirin 81 MG chewable tablet Chew 324 mg by mouth once.   atorvastatin 20 MG tablet Commonly known as:  LIPITOR Take 1 tablet (20 mg total) by mouth daily.   buPROPion 150 MG 12 hr tablet  Commonly known as:  WELLBUTRIN SR TAKE ONE TABLET BY MOUTH TWICE DAILY   clopidogrel 75 MG tablet Commonly known as:  PLAVIX Take 1 tablet (75 mg total) by mouth daily. Please make yearly appt with Dr. Radford Pax for May before anymore refills. 1st  attempt   ELIQUIS 5 MG Tabs tablet Generic drug:  apixaban TAKE ONE TABLET BY MOUTH TWICE DAILY   fenofibrate 145 MG tablet Commonly known as:  TRICOR Take 1 tablet (145 mg total) by mouth daily.   FLUoxetine 20 MG capsule Commonly known as:  PROZAC TAKE ONE CAPSULE BY MOUTH AT BEDTIME   fluticasone 44 MCG/ACT inhaler Commonly known as:  FLOVENT HFA Inhale 2 puffs into the lungs 2 (two) times daily as needed (shortness of breath/ wheezing).   gabapentin 300 MG capsule Commonly known as:  NEURONTIN Take 3 capsules (900 mg total) by mouth 2 (two) times daily. TAKE 3 CAPSULES BY MOUTH EVERY  MORNING AND 3 CAPSULES AT BEDTIME What changed:  additional instructions   insulin degludec 100 UNIT/ML Sopn FlexTouch Pen Commonly known as:  TRESIBA FLEXTOUCH Inject 0.2 mLs (20 Units total) into the skin daily at 10 pm. What changed:  See the new instructions.   Insulin Pen Needle 32G X 4 MM Misc Use to inject insulin daily   insulin regular 100 units/mL injection Commonly known as:  NOVOLIN R RELION Inject 15-25-10 units three times daily just before each meal. What changed:    how much to take  how to take this  when to take this  additional instructions   INSULIN SYRINGE .5CC/31GX5/16" 31G X 5/16" 0.5 ML Misc Use to inject insulin   Magnesium Oxide 200 MG Tabs Take 1 tablet (200 mg total) by mouth 2 (two) times daily.   metFORMIN 500 MG 24 hr tablet Commonly known as:  GLUCOPHAGE-XR Take 4 tablets (2,000 mg total) daily with breakfast by mouth. What changed:    how much to take  when to take this   methimazole 10 MG tablet Commonly known as:  TAPAZOLE TAKE ONE TABLET BY MOUTH ONCE DAILY   metoprolol succinate 25 MG 24 hr tablet Commonly known as:  TOPROL-XL TAKE ONE TABLET BY MOUTH TWICE DAILY   nitroGLYCERIN 0.4 MG SL tablet Commonly known as:  NITROSTAT Place 1 tablet (0.4 mg total) under the tongue every 5 (five) minutes as needed for chest pain.    pantoprazole 40 MG tablet Commonly known as:  PROTONIX Take 1 tablet (40 mg total) by mouth daily.   sotalol 80 MG tablet Commonly known as:  BETAPACE TAKE ONE TABLET BY MOUTH EVERY 12 HOURS   SSD 1 % cream Generic drug:  silver sulfADIAZINE APPLY TO AFFECTED AREA ONCE DAILY   silver sulfADIAZINE 1 % cream Commonly known as:  SILVADENE Apply 1 application topically daily.   traMADol 50 MG tablet Commonly known as:  ULTRAM Take 1 tablet (50 mg total) by mouth every 8 (eight) hours as needed. What changed:    when to take this  reasons to take this      Allergies  Allergen Reactions  . Dilaudid [Hydromorphone Hcl] Other (See Comments)    HEADACHE  . Novolog [Insulin Aspart] Shortness Of Breath    "breathing problems"  . Codeine Nausea And Vomiting    HIGH DOSES-SEVERE VOMITING  . Iodine Other (See Comments)    MUST HAVE BENADRYL PRIOR TO PROCEDURE AND RIGHT BEFORE TREATMENT TO COUNTERACT REACTION-BLISTERING REACTION DERMATOLOGICAL  . Penicillins Itching, Rash and Other (  See Comments)    Has patient had a PCN reaction causing immediate rash, facial/tongue/throat swelling, SOB or lightheadedness with hypotension: Yes Has patient had a PCN reaction causing severe rash involving mucus membranes or skin necrosis: No Has patient had a PCN reaction that required hospitalization No Has patient had a PCN reaction occurring within the last 10 years: No If all of the above answers are "NO", then may proceed with Cephalosporin use.  CHEST SIZED RASH AND ITCHING   . Propofol Other (See Comments)    "Breathing problems - asthma attack" Can take with benadryl   . Ace Inhibitors Cough  . Demerol [Meperidine] Nausea And Vomiting  . Neosporin [Neomycin-Bacitracin Zn-Polymyx] Itching and Rash    MAKES REACTIONS WORSE WHEN USING AS PROPHYLACTIC  . Percocet [Oxycodone-Acetaminophen] Rash  . Tape Itching and Rash   Follow-up Information    [name], [place]. Schedule an  appointment as soon as possible for a visit in 1 week(s).   Specialty:  Urgent Care Why:  Hospital follow up Contact information: [place] [address] [phone]        Renato Shin, MD. Schedule an appointment as soon as possible for a visit in 1 week(s).   Specialty:  Endocrinology Why:  Diabetes management Contact information: [address] [place] [address] [phone]            The results of significant diagnostics from this hospitalization (including imaging, microbiology, ancillary and laboratory) are listed below for reference.    Significant Diagnostic Studies: Dg Chest Port 1 View  Result Date: 07/04/2017 CLINICAL DATA:  67 year old female with atrial fibrillation. Shortness of breath and nausea. Initial encounter. EXAM: PORTABLE CHEST 1 VIEW COMPARISON:  06/04/2017, 08/06/2016 and 08/15/2015 chest x-ray. FINDINGS: No infiltrate, congestive heart failure or pneumothorax. 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 Cardiomegaly. Calcified minimally tortuous aorta. No plain film evidence of pulmonary malignancy. IMPRESSION: No acute cardiopulmonary abnormality. Cardiomegaly. Aortic Atherosclerosis (ICD10-I70.0). 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 Electronically Signed   By: Genia Del M.D.   On: 07/04/2017 07:04    Microbiology: No results found for this or any previous visit (from the past 240 hour(s)).   Labs: Basic Metabolic Panel: Recent Labs  Lab 07/04/17 0619 07/04/17 0652 07/05/17 0509 07/05/17 1622  NA 132* 135 136  --   K 3.5 3.5 4.6  --   CL 95* 96* 101  --   CO2 24  --  25  --   GLUCOSE 331* 334* 222*  --   BUN 15 17 23*  --   CREATININE 0.74 0.60 0.89  --   CALCIUM 8.7*  --  9.2  --   MG 1.4*  --   --  1.6*   Liver Function Tests: No results for  input(s): AST, ALT, ALKPHOS, BILITOT, PROT, ALBUMIN in the last 168 hours. No results for input(s): LIPASE, AMYLASE in the last 168 hours. No results for input(s): AMMONIA in the last 168 hours. CBC: Recent Labs  Lab 07/04/17 0619 07/04/17 0652  WBC 7.8  --   HGB 12.6 13.3  HCT 37.5 39.0  MCV 87.2  --   PLT 427*  --    Cardiac Enzymes: Recent Labs  Lab 07/04/17 0729 07/04/17 1658 07/04/17 1908  TROPONINI 0.06* 0.23* 0.24*   BNP: BNP (last 3 results) No results  for input(s): BNP in the last 8760 hours.  ProBNP (last 3 results) No results for input(s): PROBNP in the last 8760 hours.  CBG: Recent Labs  Lab 07/04/17 1657 07/04/17 2042 07/05/17 0748 07/05/17 1131 07/05/17 1623  GLUCAP 170* 272* 198* 312* 199*       Signed:  Nori Winegar  [facility] 07/05/2017, 7:42 PM

## 2017-07-05 NOTE — Progress Notes (Addendum)
Benefit check in progress for TRESIBA FLEXTOUCH 100 UNIT/ML SOPN FlexTouch Pen/ and Lantus insulin as requested; Aneta Mins [phone]  Memory Argue CMA        # 6.  S/ [address] [place] @ [employer] [facility] # [phone]    TRESIBA FLEX TOUCH 100 UNIT / ML SOPN FLEX TOUCH PEN   COVER- YES  CO-PAY- ZERO DOLLARS  TIER- 3 DRUG  PRIOR APPROVAL- NO   PREFERRED PHARMACY : [facility] AND [facility]

## 2017-07-05 NOTE — Telephone Encounter (Signed)
I am not her PCP. She has seen multiple providers at our clinic. She can come in and try to establish with one of [place]. In the meantime, I'll place referral to have this done.   Scheduling, please call patient and ask her to set up an appointment to establish care with one provider as her primary care provider that she has seen at our clinic.

## 2017-07-07 ENCOUNTER — Other Ambulatory Visit (HOSPITAL_COMMUNITY): Payer: Self-pay | Admitting: *Deleted

## 2017-07-07 ENCOUNTER — Ambulatory Visit: Payer: Medicare PPO | Admitting: Endocrinology

## 2017-07-07 ENCOUNTER — Ambulatory Visit (HOSPITAL_COMMUNITY)
Admission: RE | Admit: 2017-07-07 | Discharge: 2017-07-07 | Disposition: A | Discharge status: Home / Self Care | Payer: Medicare PPO | Source: Ambulatory Visit | Attending: Nurse Practitioner | Admitting: Nurse Practitioner

## 2017-07-07 ENCOUNTER — Telehealth: Payer: Self-pay

## 2017-07-07 ENCOUNTER — Encounter: Payer: Self-pay | Admitting: Endocrinology

## 2017-07-07 ENCOUNTER — Encounter (HOSPITAL_COMMUNITY): Payer: Self-pay | Admitting: Nurse Practitioner

## 2017-07-07 VITALS — BP 126/68 | HR 85 | Ht 64.0 in | Wt 179.0 lb

## 2017-07-07 VITALS — BP 162/100 | HR 74 | Ht 64.0 in | Wt 178.4 lb

## 2017-07-07 DIAGNOSIS — E0590 Thyrotoxicosis, unspecified without thyrotoxic crisis or storm: Secondary | ICD-10-CM | POA: Diagnosis not present

## 2017-07-07 DIAGNOSIS — E785 Hyperlipidemia, unspecified: Secondary | ICD-10-CM | POA: Insufficient documentation

## 2017-07-07 DIAGNOSIS — I2510 Atherosclerotic heart disease of native coronary artery without angina pectoris: Secondary | ICD-10-CM | POA: Insufficient documentation

## 2017-07-07 DIAGNOSIS — Z89421 Acquired absence of other right toe(s): Secondary | ICD-10-CM | POA: Insufficient documentation

## 2017-07-07 DIAGNOSIS — Z820 Family history of epilepsy and other diseases of the nervous system: Secondary | ICD-10-CM | POA: Insufficient documentation

## 2017-07-07 DIAGNOSIS — Z87891 Personal history of nicotine dependence: Secondary | ICD-10-CM | POA: Diagnosis not present

## 2017-07-07 DIAGNOSIS — Z880 Allergy status to penicillin: Secondary | ICD-10-CM | POA: Insufficient documentation

## 2017-07-07 DIAGNOSIS — E1142 Type 2 diabetes mellitus with diabetic polyneuropathy: Secondary | ICD-10-CM | POA: Diagnosis not present

## 2017-07-07 DIAGNOSIS — G5603 Carpal tunnel syndrome, bilateral upper limbs: Secondary | ICD-10-CM | POA: Diagnosis not present

## 2017-07-07 DIAGNOSIS — Z836 Family history of other diseases of the respiratory system: Secondary | ICD-10-CM | POA: Insufficient documentation

## 2017-07-07 DIAGNOSIS — Z9889 Other specified postprocedural states: Secondary | ICD-10-CM | POA: Insufficient documentation

## 2017-07-07 DIAGNOSIS — K219 Gastro-esophageal reflux disease without esophagitis: Secondary | ICD-10-CM | POA: Insufficient documentation

## 2017-07-07 DIAGNOSIS — N189 Chronic kidney disease, unspecified: Secondary | ICD-10-CM | POA: Insufficient documentation

## 2017-07-07 DIAGNOSIS — Z794 Long term (current) use of insulin: Secondary | ICD-10-CM | POA: Diagnosis not present

## 2017-07-07 DIAGNOSIS — I480 Paroxysmal atrial fibrillation: Secondary | ICD-10-CM

## 2017-07-07 DIAGNOSIS — Z888 Allergy status to other drugs, medicaments and biological substances status: Secondary | ICD-10-CM | POA: Insufficient documentation

## 2017-07-07 DIAGNOSIS — Z79899 Other long term (current) drug therapy: Secondary | ICD-10-CM | POA: Diagnosis not present

## 2017-07-07 DIAGNOSIS — E1101 Type 2 diabetes mellitus with hyperosmolarity with coma: Secondary | ICD-10-CM | POA: Diagnosis not present

## 2017-07-07 DIAGNOSIS — Z8619 Personal history of other infectious and parasitic diseases: Secondary | ICD-10-CM | POA: Insufficient documentation

## 2017-07-07 DIAGNOSIS — J45909 Unspecified asthma, uncomplicated: Secondary | ICD-10-CM | POA: Insufficient documentation

## 2017-07-07 DIAGNOSIS — Z9109 Other allergy status, other than to drugs and biological substances: Secondary | ICD-10-CM | POA: Diagnosis not present

## 2017-07-07 DIAGNOSIS — Z90710 Acquired absence of both cervix and uterus: Secondary | ICD-10-CM | POA: Insufficient documentation

## 2017-07-07 DIAGNOSIS — Z8249 Family history of ischemic heart disease and other diseases of the circulatory system: Secondary | ICD-10-CM | POA: Insufficient documentation

## 2017-07-07 DIAGNOSIS — Z955 Presence of coronary angioplasty implant and graft: Secondary | ICD-10-CM | POA: Insufficient documentation

## 2017-07-07 DIAGNOSIS — E1122 Type 2 diabetes mellitus with diabetic chronic kidney disease: Secondary | ICD-10-CM | POA: Insufficient documentation

## 2017-07-07 DIAGNOSIS — Z801 Family history of malignant neoplasm of trachea, bronchus and lung: Secondary | ICD-10-CM | POA: Insufficient documentation

## 2017-07-07 DIAGNOSIS — Z885 Allergy status to narcotic agent status: Secondary | ICD-10-CM | POA: Insufficient documentation

## 2017-07-07 DIAGNOSIS — I129 Hypertensive chronic kidney disease with stage 1 through stage 4 chronic kidney disease, or unspecified chronic kidney disease: Secondary | ICD-10-CM | POA: Diagnosis not present

## 2017-07-07 DIAGNOSIS — I2582 Chronic total occlusion of coronary artery: Secondary | ICD-10-CM | POA: Insufficient documentation

## 2017-07-07 DIAGNOSIS — Z8349 Family history of other endocrine, nutritional and metabolic diseases: Secondary | ICD-10-CM | POA: Insufficient documentation

## 2017-07-07 DIAGNOSIS — Z806 Family history of leukemia: Secondary | ICD-10-CM | POA: Insufficient documentation

## 2017-07-07 DIAGNOSIS — Z803 Family history of malignant neoplasm of breast: Secondary | ICD-10-CM | POA: Insufficient documentation

## 2017-07-07 LAB — BASIC METABOLIC PANEL
Anion gap: 10 (ref 5–15)
BUN: 21 mg/dL — AB (ref 6–20)
CHLORIDE: 98 mmol/L — AB (ref 101–111)
CO2: 28 mmol/L (ref 22–32)
CREATININE: 1.03 mg/dL — AB (ref 0.44–1.00)
Calcium: 9.5 mg/dL (ref 8.9–10.3)
GFR calc non Af Amer: 56 mL/min — ABNORMAL LOW (ref >60)
Glucose, Bld: 208 mg/dL — ABNORMAL HIGH (ref 65–99)
Potassium: 4.3 mmol/L (ref 3.5–5.1)
SODIUM: 136 mmol/L (ref 135–145)

## 2017-07-07 LAB — MAGNESIUM: MAGNESIUM: 1.2 mg/dL — AB (ref 1.7–2.4)

## 2017-07-07 MED ORDER — MAGNESIUM OXIDE 400 MG PO TABS: 400.0000 mg | ORAL_TABLET | Freq: Every day | ORAL | 6 refills | Status: DC

## 2017-07-07 MED ORDER — INSULIN REGULAR HUMAN 100 UNIT/ML IJ SOLN: INTRAMUSCULAR | 11 refills | Status: DC

## 2017-07-07 NOTE — Patient Instructions (Addendum)
check your blood sugar twice a day.  vary the time of day when you check, between before the 3 meals, and at bedtime.  also check if you have symptoms of your blood sugar being too high or too low.  please keep a record of the readings and bring it to your next appointment here (or you can bring the meter itself).  You can write it on any piece of paper.  please call us sooner if your blood sugar goes below 70, or if you have a lot of readings over 200.   Please continue the same medications for now.  Please come back for a follow-up appointment in 2 months.  

## 2017-07-07 NOTE — Progress Notes (Signed)
Subjective:    Patient ID: Judith [name], female    DOB: 1951-02-28, 67 y.o.   MRN: [id number]  HPI Pt returns for f/u of hyperthyroidism (pt says she took synthroid for a brief time in the 1990's, in an attempt to shrink a goiter; slightly suppressed TSH was first noted in late 2016, when she was in the hospital for new-onset AF; she converted back to SR; nuc med scan showed heterogeneous uptake; neck scar is from C-spine procedure). she takes tapazole as rx'ed.  pt states she feels well in general.   Pt also ret for f/u of DM: DM type: Insulin-requiring type 2. Dx'ed: 9024 Complications: polyneuropathy, nephropathy, retinopathy, and CAD.  Therapy: insulin since 1997, and metformin.   GDM: never.  DKA: never. Severe hypoglycemia: never.   Pancreatitis: never.   Other: she takes multiple daily injections.  Interval history: She ran out of insulin 5 days prior to recent hospitalization.  no cbg record, but states while on it (tresiba and regular), cbg's were well-controlled.   Past Medical History:  Diagnosis Date  . Abnormal EKG 07/31/2013  . Arthritis    "hands" (03/06/2015)  . Asthma   . Carpal tunnel syndrome, bilateral   . Chronic kidney disease   . Complication of anesthesia    slow to wake up  . Coronary artery disease     2 v CAD with CTO of the RCA and high grade bifurcational LCx/OM stenosis. S/P PCI DES x 2 to the LCx/OM.  Marland Kitchen Diabetic peripheral neuropathy ([place]) "since 1996"  . GERD (gastroesophageal reflux disease)   . Goiter   . Headache    migraines prior to menopause  . History of shingles 06/01/2013  . Hyperlipidemia LDL goal <70 10/13/2015  . Hypertension   . Hyperthyroidism   . PAF (paroxysmal atrial fibrillation) ([place]) 04/29/2015   CHADS2VASC score of 4 now on Apixaban  . Pneumonia ~ 1976  . Tremors of nervous system   . Type II diabetes mellitus (HCC)    insulin dependent    Past Surgical History:  Procedure Laterality Date  . ABDOMINAL HYSTERECTOMY   1988   age 66; CERVICAL DYSPLASIA; ovaries intact.   . AMPUTATION Right 01/23/2016   Procedure: Right 3rd Ray Amputation;  Surgeon: [name], MD;  Location: [place];  Service: Orthopedics;  Laterality: Right;  . AMPUTATION Right 02/13/2016   Procedure: Right Transmetatarsal Amputation;  Surgeon: [name], MD;  Location: [facility];  Service: Orthopedics;  Laterality: Right;  . CARDIAC CATHETERIZATION N/A 02/27/2015   Procedure: Left Heart Cath and Coronary Angiography;  Surgeon: [name], MD; LAD 40%, mCFX 80%, OM 70%, RCA 100% calcified       . CARDIAC CATHETERIZATION N/A 03/06/2015   Procedure: Coronary Stent Intervention;  Surgeon: [name], MD;  Location: [facility];  Service: Cardiovascular;  Laterality: N/A;  Mid CX 3.50x12 promus DES w/ 0% resdual and Prox OM1 2.50x20 promus DES w/ 20% residual  . CARPAL TUNNEL RELEASE Right Nov 2015  . CARPAL TUNNEL RELEASE Right 1992; 05/2014   [name]; [place]  . CESAREAN SECTION  1982; 1984  . FOOT NEUROMA SURGERY Bilateral 2000  . KNEE ARTHROSCOPY Right ~ 2003   "meniscus repair"  . SHOULDER OPEN ROTATOR CUFF REPAIR Right 1996; 1998   "w/fracture repair"  . THYROID SURGERY  2000   "removed lots of nodules"  . TONSILLECTOMY  1976    Social History   Socioeconomic History  . Marital status:  Single    Spouse name: Not on file  . Number of children: 2  . Years of education: Masters  . Highest education level: Not on file  Social Needs  . Financial resource strain: Not on file  . Food insecurity - worry: Not on file  . Food insecurity - inability: Not on file  . Transportation needs - medical: Not on file  . Transportation needs - non-medical: Not on file  Occupational History  . Occupation: Landscape architect  Tobacco Use  . Smoking status: Former Smoker    Packs/day: 0.00    Years: 41.00    Pack years: 0.00    Types: Cigarettes    Last attempt to quit: 03/05/2015    Years since quitting: 2.3  .  Smokeless tobacco: Never Used  . Tobacco comment: 04/29/2015 "quit smoking cigarettes 02/27/2015"  Substance and Sexual Activity  . Alcohol use: No  . Drug use: No  . Sexual activity: Not Currently    Birth control/protection: Post-menopausal, Surgical  Other Topics Concern  . 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      Lives with sister in [place].      Tobacco: 1 ppd x 41 years - quit 2016      Alcohol: none      Drugs: none      Exercise:  Walking in neighborhood; physical job.   Right-handed.   2 cups caffeine daily.    Current Outpatient Medications on File Prior to Visit  Medication Sig Dispense Refill  . albuterol (PROVENTIL HFA) 108 (90 Base) MCG/ACT inhaler Inhale 1-2 puffs into the lungs every 6 (six) hours as needed for wheezing or shortness of breath. 1 Inhaler 0  . atorvastatin (LIPITOR) 20 MG tablet Take 1 tablet (20 mg total) by mouth daily. 90 tablet 1  . buPROPion (WELLBUTRIN SR) 150 MG 12 hr tablet TAKE ONE TABLET BY MOUTH TWICE DAILY 60 tablet 11  . clopidogrel (PLAVIX) 75 MG tablet Take 1 tablet (75 mg total) by mouth daily. Please make yearly appt with Dr. Radford Pax for May before anymore refills. 1st attempt 90 tablet 1  . ELIQUIS 5 MG TABS tablet TAKE ONE TABLET BY MOUTH TWICE DAILY 60 tablet 5  . fenofibrate (TRICOR) 145 MG tablet Take 1 tablet (145 mg total) by mouth daily. 90 tablet 3  . FLUoxetine (PROZAC) 20 MG capsule TAKE ONE CAPSULE BY MOUTH AT BEDTIME 30 capsule 11  . fluticasone (FLOVENT HFA) 44 MCG/ACT inhaler Inhale 2 puffs into the lungs 2 (two) times daily as needed (shortness of breath/ wheezing).     . gabapentin (NEURONTIN) 300 MG capsule Take 3 capsules (900 mg total) by mouth 2 (two) times daily. TAKE 3 CAPSULES BY  MOUTH EVERY  MORNING AND 3 CAPSULES AT BEDTIME (Patient taking differently: Take 900 mg by mouth 2 (two) times daily. ) 540 capsule 3  . insulin degludec (TRESIBA FLEXTOUCH) 100 UNIT/ML SOPN FlexTouch Pen Inject 0.2 mLs (20 Units total) into the skin daily at 10 pm. 15 mL 0  . Insulin Pen Needle 32G X 4 MM MISC Use to inject insulin daily 100 each 5  . Insulin Syringe-Needle U-100 (INSULIN SYRINGE .5CC/31GX5/16") 31G X 5/16" 0.5 ML MISC Use to inject insulin  100 each 5  . metFORMIN (GLUCOPHAGE-XR) 500 MG 24 hr tablet Take 4 tablets (2,000 mg total) daily with breakfast by mouth. (Patient taking differently: Take 1,000 mg by mouth 2 (two) times daily. ) 360 tablet 3  . methimazole (TAPAZOLE) 10 MG tablet TAKE ONE TABLET BY MOUTH ONCE DAILY 30 tablet 11  . metoprolol succinate (TOPROL-XL) 25 MG 24 hr tablet TAKE ONE TABLET BY MOUTH TWICE DAILY 180 tablet 2  . nitroGLYCERIN (NITROSTAT) 0.4 MG SL tablet Place 1 tablet (0.4 mg total) under the tongue every 5 (five) minutes as needed for chest pain. 25 tablet 3  . pantoprazole (PROTONIX) 40 MG tablet Take 1 tablet (40 mg total) by mouth daily. 90 tablet 3  . silver sulfADIAZINE (SILVADENE) 1 % cream Apply 1 application topically daily. 50 g 1  . sotalol (BETAPACE) 80 MG tablet TAKE ONE TABLET BY MOUTH EVERY 12 HOURS 180 tablet 2  . SSD 1 % cream APPLY TO AFFECTED AREA ONCE DAILY 50 g 0  . traMADol (ULTRAM) 50 MG tablet Take 1 tablet (50 mg total) by mouth every 8 (eight) hours as needed. (Patient taking differently: Take 50 mg by mouth every 6 (six) hours as needed for moderate pain. ) 90 tablet 1   No current facility-administered medications on file prior to visit.     Allergies  Allergen Reactions  . Dilaudid [Hydromorphone Hcl] Other (See Comments)    HEADACHE  . Novolog [Insulin Aspart] Shortness Of Breath    "breathing problems"  . Codeine Nausea And Vomiting    HIGH DOSES-SEVERE VOMITING  . Iodine Other (See Comments)    MUST HAVE BENADRYL  PRIOR TO PROCEDURE AND RIGHT BEFORE TREATMENT TO COUNTERACT REACTION-BLISTERING REACTION DERMATOLOGICAL  . Penicillins Itching, Rash and Other (See Comments)    Has patient had a PCN reaction causing immediate rash, facial/tongue/throat swelling, SOB or lightheadedness with hypotension: Yes Has patient had a PCN reaction causing severe rash involving mucus membranes or skin necrosis: No Has patient had a PCN reaction that required hospitalization No Has patient had a PCN reaction occurring within the last 10 years: No If all of the above answers are "NO", then may proceed with Cephalosporin use.  CHEST SIZED RASH AND ITCHING   . Propofol Other (See Comments)    "Breathing problems - asthma attack" Can take with benadryl   . Ace Inhibitors Cough  . Demerol [Meperidine] Nausea And Vomiting  . Neosporin [Neomycin-Bacitracin Zn-Polymyx] Itching and Rash    MAKES REACTIONS WORSE WHEN USING AS PROPHYLACTIC  . Percocet [Oxycodone-Acetaminophen] Rash  . Tape Itching and Rash    Family History  Problem Relation Age of Onset  . Cancer Mother 58       bronchial cancer  . Breast cancer Mother   . Lung cancer Mother   . Hypertension Father   . COPD Father   . Heart disease Father 59       CAD with cardiac stenting  . Heart attack Father   . Parkinson's disease Father   . Allergies Sister   . Breast cancer Maternal Grandmother   . Emphysema Maternal Grandfather   . Leukemia Paternal Grandmother   . Emphysema Paternal Grandfather   . Thyroid disease Neg Hx     BP (!) 162/100 (BP Location: Left Arm, Patient Position: Sitting, Cuff Size: Normal)   Pulse 74   Ht '5\' 4"'  (1.626 m)   Wt 178 lb 6.4 oz (80.9 kg)   SpO2 97%   BMI 30.62  kg/m    Review of Systems She denies hypoglycemia and fever     Objective:   Physical Exam VITAL SIGNS:  See vs page GENERAL: no distress.  In wheelchair Pulses: foot pulses are intact bilaterally.   MSK: no deformity of the feet, except for right  transmetatarsal amputation.  CV: trace bilat edema of the legs Skin:  no ulcer on the feet or ankles.  normal color and temp on the feet and ankles Neuro: sensation is intact to touch on the feet and ankles, but severely decreased from normal Ext: There is bilateral onychomycosis of the toenails   Lab Results  Component Value Date   TSH 0.897 07/04/2017   T4TOTAL 7.8 04/30/2015     Lab Results  Component Value Date   HGBA1C 9.2 (H) 07/04/2017       Assessment & Plan:  Hyperthyroidism: well-controlled Noncompliance with cbg recording and insulin.  This compromises the rx of DM.  Insulin-requiring type 2 DM, with DR: better glycemic control back on rx    Patient Instructions  check your blood sugar twice a day.  vary the time of day when you check, between before the 3 meals, and at bedtime.  also check if you have symptoms of your blood sugar being too high or too low.  please keep a record of the readings and bring it to your next appointment here (or you can bring the meter itself).  You can write it on any piece of paper.  please call us sooner if your blood sugar goes below 70, or if you have a lot of readings over 200.  Please continue the same medications for now.   Please come back for a follow-up appointment in 2 months.

## 2017-07-07 NOTE — Progress Notes (Signed)
Primary Care Physician: [name], MD Referring Physician: [facility] ER f/u Cardiologist: Dr. Krystal Eaton Ra is a 67 y.o. female with a h/o history of HTN, ASCAD with CTO of the RCA and high grade LCx/OM stenosis s/p DES x 2, hyperlipidemia, Type II DM, Hyperthyroidism on Methimazole, Persistent atrial fibrillation s/p multiple DCCV in the past x 3 who ison apixaban and Sotolol. She was admitted 08/07/2016 with palpitations and was found to be in atrial fibrillation with RVR due to missing 3 days of Sotolol and in setting of recent GI illness and K+ of 3.5.  She underwent DCCV x 2 and later discharged home.  She is placed back on sotalol.  She is in the Afib clinic for f/u ER visit for afib with RVR. She took an extra sotalol before leaving home. She also was noted to have a wide complex tachycardia, thought to be aberrant afib. She was found to have low mag and K+ and repleted. No further issues. She is pending a sleep study. States she does not drink any alcohol or excessive caffeine.   Today, she denies symptoms of palpitations, chest pain, shortness of breath, orthopnea, PND, lower extremity edema, dizziness, presyncope, syncope, or neurologic sequela. The patient is tolerating medications without difficulties and is otherwise without complaint today.   Past Medical History:  Diagnosis Date  . Abnormal EKG 07/31/2013  . Arthritis    "hands" (03/06/2015)  . Asthma   . Carpal tunnel syndrome, bilateral   . Chronic kidney disease   . Complication of anesthesia    slow to wake up  . Coronary artery disease     2 v CAD with CTO of the RCA and high grade bifurcational LCx/OM stenosis. S/P PCI DES x 2 to the LCx/OM.  Marland Kitchen Diabetic peripheral neuropathy ([place]) "since 1996"  . GERD (gastroesophageal reflux disease)   . Goiter   . Headache    migraines prior to menopause  . History of shingles 06/01/2013  . Hyperlipidemia LDL goal <70 10/13/2015  . Hypertension   . Hyperthyroidism   .  PAF (paroxysmal atrial fibrillation) ([place]) 04/29/2015   CHADS2VASC score of 4 now on Apixaban  . Pneumonia ~ 1976  . Tremors of nervous system   . Type II diabetes mellitus (HCC)    insulin dependent   Past Surgical History:  Procedure Laterality Date  . ABDOMINAL HYSTERECTOMY  1988   age 40; CERVICAL DYSPLASIA; ovaries intact.   . AMPUTATION Right 01/23/2016   Procedure: Right 3rd Ray Amputation;  Surgeon: [name], MD;  Location: [facility];  Service: Orthopedics;  Laterality: Right;  . AMPUTATION Right 02/13/2016   Procedure: Right Transmetatarsal Amputation;  Surgeon: [name], MD;  Location: [facility];  Service: Orthopedics;  Laterality: Right;  . CARDIAC CATHETERIZATION N/A 02/27/2015   Procedure: Left Heart Cath and Coronary Angiography;  Surgeon: [name], MD; LAD 40%, mCFX 80%, OM 70%, RCA 100% calcified       . CARDIAC CATHETERIZATION N/A 03/06/2015   Procedure: Coronary Stent Intervention;  Surgeon: [name], MD;  Location: [facility];  Service: Cardiovascular;  Laterality: N/A;  Mid CX 3.50x12 promus DES w/ 0% resdual and Prox OM1 2.50x20 promus DES w/ 20% residual  . CARPAL TUNNEL RELEASE Right Nov 2015  . CARPAL TUNNEL RELEASE Right 1992; 05/2014   [name]; [place]  . CESAREAN SECTION  1982; 1984  . FOOT NEUROMA SURGERY Bilateral 2000  . KNEE ARTHROSCOPY Right ~ 2003   "  meniscus repair"  . SHOULDER OPEN ROTATOR CUFF REPAIR Right 1996; 1998   "w/fracture repair"  . THYROID SURGERY  2000   "removed lots of nodules"  . TONSILLECTOMY  1976    Current Outpatient Medications  Medication Sig Dispense Refill  . albuterol (PROVENTIL HFA) 108 (90 Base) MCG/ACT inhaler Inhale 1-2 puffs into the lungs every 6 (six) hours as needed for wheezing or shortness of breath. 1 Inhaler 0  . atorvastatin (LIPITOR) 20 MG tablet Take 1 tablet (20 mg total) by mouth daily. 90 tablet 1  . buPROPion (WELLBUTRIN SR) 150 MG 12 hr tablet TAKE ONE TABLET BY MOUTH TWICE DAILY  60 tablet 11  . clopidogrel (PLAVIX) 75 MG tablet Take 1 tablet (75 mg total) by mouth daily. Please make yearly appt with Dr. Radford Pax for May before anymore refills. 1st attempt 90 tablet 1  . ELIQUIS 5 MG TABS tablet TAKE ONE TABLET BY MOUTH TWICE DAILY 60 tablet 5  . fenofibrate (TRICOR) 145 MG tablet Take 1 tablet (145 mg total) by mouth daily. 90 tablet 3  . FLUoxetine (PROZAC) 20 MG capsule TAKE ONE CAPSULE BY MOUTH AT BEDTIME 30 capsule 11  . fluticasone (FLOVENT HFA) 44 MCG/ACT inhaler Inhale 2 puffs into the lungs 2 (two) times daily as needed (shortness of breath/ wheezing).     . gabapentin (NEURONTIN) 300 MG capsule Take 3 capsules (900 mg total) by mouth 2 (two) times daily. TAKE 3 CAPSULES BY MOUTH EVERY  MORNING AND 3 CAPSULES AT BEDTIME (Patient taking differently: Take 900 mg by mouth 2 (two) times daily. ) 540 capsule 3  . insulin degludec (TRESIBA FLEXTOUCH) 100 UNIT/ML SOPN FlexTouch Pen Inject 0.2 mLs (20 Units total) into the skin daily at 10 pm. 15 mL 0  . Insulin Pen Needle 32G X 4 MM MISC Use to inject insulin daily 100 each 5  . insulin regular (NOVOLIN R RELION) 100 units/mL injection 3 times a day (just before each meal), 15-25-10 units 20 mL 11  . Insulin Syringe-Needle U-100 (INSULIN SYRINGE .5CC/31GX5/16") 31G X 5/16" 0.5 ML MISC Use to inject insulin 100 each 5  . Magnesium Oxide 200 MG TABS Take 1 tablet (200 mg total) by mouth 2 (two) times daily. 14 tablet 0  . metFORMIN (GLUCOPHAGE-XR) 500 MG 24 hr tablet Take 4 tablets (2,000 mg total) daily with breakfast by mouth. (Patient taking differently: Take 1,000 mg by mouth 2 (two) times daily. ) 360 tablet 3  . methimazole (TAPAZOLE) 10 MG tablet TAKE ONE TABLET BY MOUTH ONCE DAILY 30 tablet 11  . metoprolol succinate (TOPROL-XL) 25 MG 24 hr tablet TAKE ONE TABLET BY MOUTH TWICE DAILY 180 tablet 2  . nitroGLYCERIN (NITROSTAT) 0.4 MG SL tablet Place 1 tablet (0.4 mg total) under the tongue every 5 (five) minutes as needed  for chest pain. 25 tablet 3  . pantoprazole (PROTONIX) 40 MG tablet Take 1 tablet (40 mg total) by mouth daily. 90 tablet 3  . silver sulfADIAZINE (SILVADENE) 1 % cream Apply 1 application topically daily. 50 g 1  . sotalol (BETAPACE) 80 MG tablet TAKE ONE TABLET BY MOUTH EVERY 12 HOURS 180 tablet 2  . SSD 1 % cream APPLY TO AFFECTED AREA ONCE DAILY 50 g 0  . traMADol (ULTRAM) 50 MG tablet Take 1 tablet (50 mg total) by mouth every 8 (eight) hours as needed. (Patient taking differently: Take 50 mg by mouth every 6 (six) hours as needed for moderate pain. ) 90 tablet 1  No current facility-administered medications for this encounter.     Allergies  Allergen Reactions  . Dilaudid [Hydromorphone Hcl] Other (See Comments)    HEADACHE  . Novolog [Insulin Aspart] Shortness Of Breath    "breathing problems"  . Codeine Nausea And Vomiting    HIGH DOSES-SEVERE VOMITING  . Iodine Other (See Comments)    MUST HAVE BENADRYL PRIOR TO PROCEDURE AND RIGHT BEFORE TREATMENT TO COUNTERACT REACTION-BLISTERING REACTION DERMATOLOGICAL  . Penicillins Itching, Rash and Other (See Comments)    Has patient had a PCN reaction causing immediate rash, facial/tongue/throat swelling, SOB or lightheadedness with hypotension: Yes Has patient had a PCN reaction causing severe rash involving mucus membranes or skin necrosis: No Has patient had a PCN reaction that required hospitalization No Has patient had a PCN reaction occurring within the last 10 years: No If all of the above answers are "NO", then may proceed with Cephalosporin use.  CHEST SIZED RASH AND ITCHING   . Propofol Other (See Comments)    "Breathing problems - asthma attack" Can take with benadryl   . Ace Inhibitors Cough  . Demerol [Meperidine] Nausea And Vomiting  . Neosporin [Neomycin-Bacitracin Zn-Polymyx] Itching and Rash    MAKES REACTIONS WORSE WHEN USING AS PROPHYLACTIC  . Percocet [Oxycodone-Acetaminophen] Rash  . Tape Itching and Rash     Social History   Socioeconomic History  . Marital status: Single    Spouse name: Not on file  . Number of children: 2  . Years of education: Masters  . Highest education level: Not on file  Social Needs  . Financial resource strain: Not on file  . Food insecurity - worry: Not on file  . Food insecurity - inability: Not on file  . Transportation needs - medical: Not on file  . Transportation needs - non-medical: Not on file  Occupational History  . Occupation: Landscape architect  Tobacco Use  . Smoking status: Former Smoker    Packs/day: 0.00    Years: 41.00    Pack years: 0.00    Types: Cigarettes    Last attempt to quit: 03/05/2015    Years since quitting: 2.3  . Smokeless tobacco: Never Used  . Tobacco comment: 04/29/2015 "quit smoking cigarettes 02/27/2015"  Substance and Sexual Activity  . Alcohol use: No  . Drug use: No  . Sexual activity: Not Currently    Birth control/protection: Post-menopausal, Surgical  Other Topics Concern  . 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      Lives with sister in [place].      Tobacco: 1 ppd x 41 years - quit 2016      Alcohol: none      Drugs: none      Exercise:  Walking in neighborhood; physical job.   Right-handed.   2 cups caffeine daily.    Family History  Problem Relation Age of Onset  . Cancer Mother 39       bronchial cancer  . Breast cancer Mother   . Lung cancer Mother   . Hypertension Father   . COPD Father   . Heart disease Father 98       CAD with cardiac stenting  . Heart attack Father   . Parkinson's disease Father   .  Allergies Sister   . Breast cancer Maternal Grandmother   . Emphysema Maternal Grandfather   . Leukemia Paternal Grandmother   . Emphysema Paternal  Grandfather   . Thyroid disease Neg Hx     ROS- All systems are reviewed and negative except as per the HPI above  Physical Exam: Vitals:   07/07/17 1327  BP: 126/68  Pulse: 85  Weight: 179 lb (81.2 kg)  Height: 5\' 4"  (1.626 m)   Wt Readings from Last 3 Encounters:  07/07/17 179 lb (81.2 kg)  07/07/17 178 lb 6.4 oz (80.9 kg)  07/05/17 177 lb 1.6 oz (80.3 kg)    Labs: Lab Results  Component Value Date   NA 136 07/05/2017   K 4.6 07/05/2017   CL 101 07/05/2017   CO2 25 07/05/2017   GLUCOSE 222 (H) 07/05/2017   BUN 23 (H) 07/05/2017   CREATININE 0.89 07/05/2017   CALCIUM 9.2 07/05/2017   MG 1.6 (L) 07/05/2017   Lab Results  Component Value Date   INR 1.00 02/13/2016   Lab Results  Component Value Date   CHOL 223 (H) 01/06/2017   HDL 29 (L) 01/06/2017   [place] Comment 01/06/2017   TRIG 704 ([place]) 01/06/2017     GEN- The patient is well appearing, alert and oriented x 3 today.   Head- normocephalic, atraumatic Eyes-  Sclera clear, conjunctiva pink Ears- hearing intact Oropharynx- clear Neck- supple, no JVP Lymph- no cervical lymphadenopathy Lungs- Clear to ausculation bilaterally, normal work of breathing Heart- Regular rate and rhythm, no murmurs, rubs or gallops, PMI not laterally displaced GI- soft, NT, ND, + BS Extremities- no clubbing, cyanosis, or edema MS- no significant deformity or atrophy Skin- no rash or lesion Psych- euthymic mood, full affect Neuro- strength and sensation are intact  EKG-Sinus rhythm  at 85 bpm, pr int 182 ms, qrs int 74 bpm, qtc 461 ms Epic records reviewed    Assessment and Plan: 1. Paroxysmal  afiib  General education re management of afib Advised not to take extra sotalol for afib, but can take an extra 1/2 tab of metoprolol for breakthrough afib Pending sleep study Bmet/mag today for low K+/mag  levels and repletion in the ER TSH checked 2/25 in normal range  2. CAD No anginal symptoms  3. HTN Stable   F/u  with Dr. Radford Pax as per recall   Geroge Baseman. Lui Bellis, [facility] [address] [place], [place] [address] [phone]

## 2017-07-08 ENCOUNTER — Ambulatory Visit (INDEPENDENT_AMBULATORY_CARE_PROVIDER_SITE_OTHER): Payer: Medicare PPO | Admitting: Podiatry

## 2017-07-08 DIAGNOSIS — E1149 Type 2 diabetes mellitus with other diabetic neurological complication: Secondary | ICD-10-CM

## 2017-07-08 DIAGNOSIS — S91302A Unspecified open wound, left foot, initial encounter: Secondary | ICD-10-CM

## 2017-07-08 NOTE — Telephone Encounter (Signed)
Transition Care Management Follow-up Telephone Call   Date discharged? 07/05/17   How have you been since you were released from the hospital? Patient states that she is feeling fine since being discharged from the hospital   Do you understand why you were in the hospital? yes   Do you understand the discharge instructions? yes   Where were you discharged to? home   Items Reviewed:  Medications reviewed: yes  Allergies reviewed: yes  Dietary changes reviewed: yes  Referrals reviewed: yes   Functional Questionnaire:   Activities of Daily Living (ADLs):   She states they are independent in the following: ambulation, bathing and hygiene, feeding, continence, grooming, toileting and dressing States they require assistance with the following: none   Any transportation issues/concerns?: no   Any patient concerns? no   Confirmed importance and date/time of follow-up visits scheduled yes  Provider Appointment booked with Dr. Tamala Julian on 07/15/17 @ 3:20 pm   Confirmed with patient if condition begins to worsen call PCP or go to the ER.  Patient was given the office number and encouraged to call back with question or concerns.  : yes

## 2017-07-08 NOTE — Progress Notes (Signed)
Subjective: Judith [name] presents the office today for follow-up evaluation of a wound to the left big toe.  She is been putting Silvadene on the area daily.  She cannot see it says she is not sure how it is doing but she is not noticing increasing swelling or redness to her feet or any pus.  She has no new concerns today. Denies any systemic complaints such as fevers, chills, nausea, vomiting. No acute changes since last appointment, and no other complaints at this time.   Objective: AAO x3, NAD DP/PT pulses palpable bilaterally, CRT less than 3 seconds On the distal medial aspect of the left hallux continue to be a skin fissure with evidence of dried blood.  Upon debridement there is a skin fissure still present with a granular wound base however it appears to be improved compared to last appointment.  Measures approximately 1 x 0.1 cm.  There is no probing, undermining or tunneling.  There is no surrounding erythema, ascending sialitis.  There is no fluctuation or crepitation.  No malodor. No open lesions or pre-ulcerative lesions.  No pain with calf compression, swelling, warmth, erythema  Assessment: Skin fissure, ulceration left hallux  Plan: -All treatment options discussed with the patient including all alternatives, risks, complications.  -After verbal consent was obtained the area was cleaned with alcohol and the wound was sharply debrided to remove all hyperkeratotic periwound to healthy, granular tissue with a #312 blade scalpel.  She tolerated well.  Area was cleaned.  Silvadene was applied followed by a bandage.  Continue with daily dressing changes as well. -Monitor for any clinical signs or symptoms of infection and directed to call the office immediately should any occur or go to the ER. -RTC 10 days or sooner if needed.  -Patient encouraged to call the office with any questions, concerns, change in symptoms.   Trula Slade DPM

## 2017-07-11 ENCOUNTER — Encounter (HOSPITAL_COMMUNITY): Payer: Self-pay | Admitting: Nurse Practitioner

## 2017-07-15 ENCOUNTER — Encounter (HOSPITAL_COMMUNITY): Payer: Self-pay | Admitting: *Deleted

## 2017-07-15 ENCOUNTER — Other Ambulatory Visit (HOSPITAL_COMMUNITY): Payer: BLUE CROSS/BLUE SHIELD | Admitting: Nurse Practitioner

## 2017-07-15 ENCOUNTER — Ambulatory Visit: Payer: Self-pay | Admitting: Family Medicine

## 2017-07-15 ENCOUNTER — Encounter: Payer: Self-pay | Admitting: Physician Assistant

## 2017-07-18 ENCOUNTER — Ambulatory Visit: Payer: Medicare PPO | Admitting: Podiatry

## 2017-07-21 NOTE — Progress Notes (Signed)
Subjective:    Patient ID: Judith [name], female    DOB: 07-07-1950, 67 y.o.   MRN: [id number]  07/22/2017  Transitions Of Care (pt was seen in the ER on 07/04/17 for Afib,hyperglycemia,and hypomagnesemia)    HPI This 67 y.o. female presents for evaluation of TRANSITION OF CARE due to recent admission for atrial fibrillation, hyperglycemia, hypokalemia.  Details of discharge summary are as follows:  Admit date: 07/04/2017 Discharge date: 07/05/2017 Time spent: 45 minutes Recommendations for Outpatient Follow-up:  Patient will be discharged to home.  Patient will need to follow up with primary care provider within one week of discharge, repeat BMP and magnesium.  Follow up with cardiology in 1-2 weeks. Follow up with endocrinology, Dr. Loanne Drilling, in 1 week. Patient should continue medications as prescribed.  Patient should follow a heart healthy/carb modified diet.  Discharge Diagnoses:  Atrial fibrillation with RVR Wide complex tachycardia/hypomagnesemia Elevated troponin Diabetes mellitus, type II Essential hypertension Coronary artery disease Peripheral neuropathy Hyperthyroidism Hyperlipidemia Depression Discharge Condition: Stable Diet recommendation:  Heart healthy/carb modified      Filed Weights   07/04/17 0619 07/04/17 1903 07/05/17 0515  Weight: 81.2 kg (179 lb) 81.2 kg (179 lb) 80.3 kg (177 lb 1.6 oz)  History of present illness:  On 07/04/2017 by Ms. Erin Hearing, NP Judith [name] a 67 y.o.femalewith medical history significant fordiabetes mellitus on oral agents and previously onlong-acting insulin, hypertension,dyslipidemia, known CAD with DES to left Cfx/OMin 2016 on DAPT,hyperthyroidism on Tapazole,diabetic peripheral neuropathy and history of right transmetatarsal amputation. Patient awakened at 5 AM to go to the bathroom and while seated on the toilet she developed palpitations and chest discomfort. Associated with shortness of breath and nausea. She  called out to her sister who notified EMS. She was given 5 mg of Lopressor IV in route to the hospital. Since arrival she has been started on Cardizem infusion at 5 mg/h with improvement in heart rate down in the 70s. Since arrival she did have short burst of wide-complex tachycardia that was irregular and appears consistent with aberrantly conducted atrial fibrillation. Patient later admitted that during episode of tachyarrhythmia she took an extra dose of sotalol prior to arrival. Hospital Course:  Atrial fibrillation with RVR -Patient was symptomatic when she presented to the emergency department, she was then started on Cardizem drip and converted to norma sinus rhythm -secondary to unknown etiology, possibly K 3.5, magnesium 1.4 on admission -Currently rate and rhythm controlled -Continue Eliquis, metoprolol, sotalol Wide complex tachycardia/hypomagnesemia -Resolved -Upon arrival to the emergency department, patient had 7-8 beat run of an irregular wide complex tachycardia with aberrant conduction atrial fibrillation -Magnesium was 1.4 and replaced, currently 1.6- patient given additional supplementation prior to discharge and discharged with mag ox supplementation -Potassium was 3.5 and replaced, currently 4.6 -Repeat BMP and magnesium in one week Elevated troponin -likely secondary to the above, suspect demand -chest pain free Diabetes mellitus, type II -Patient on metformin at home  -was placed on insulin sliding scale -Patient has had insurance difficulties obtaining long-acting insulin -Case management consulted  -hemoglobin A1c 9.2 -Follow up with Dr. Loanne Drilling, endocrinology  Essential hypertension -continue metoprolol, betapacce -losartan and amlodipine held Coronary artery disease -Patient had catheterization in 2016 which showed normal left ventricular function with DES to the left circumflex/OM on DAPT -Continue Plavix, aspirin- follow up with cardiology regarding  DAPT -Echocardiogram obtained EF 55-60%, no RWMA Peripheral neuropathy -Continue Neurontin Hyperthyroidism -Continue Tapazole -TSH 0.897 Hyperlipidemia -Continue statin, TriCor Depression -Continue prozac, wellbutrin  Procedures:  Echocardiogram Consultations: None  Discharge Exam:     Vitals:   07/05/17 0900 07/05/17 1136  BP: (!) 156/60 90/60  Pulse: 74 70  Resp:  16  Temp:  97.8 F (36.6 C)  SpO2: 98% 98%  Discharge Instructions     Discharge Instructions    Amb referral to AFIB Clinic   Complete by:  As directed    Discharge instructions   Complete by:  As directed    Patient will be discharged to home.  Patient will need to follow up with primary care provider within one week of discharge, repeat BMP and magnesium.  Follow up with cardiology in 1-2 weeks. Follow up with endocrinology, Dr. Loanne Drilling, in 1 week. Patient should continue medications as prescribed.  Patient should follow a heart healthy/carb modified diet.      Doing well since hospital discharge.  Denies chest pain, palpitations, SOB, leg swelling, orthopnea, diaphoresis.   Checking sugars regularly and running high at times.  Followed by Dr. Loanne Drilling for diabetes; suffers with chronic diabetic neuropathy.   Due for repeat labs today.  BP Readings from Last 3 Encounters:  07/22/17 (!) 148/72  07/07/17 126/68  07/07/17 (!) 162/100   Wt Readings from Last 3 Encounters:  07/22/17 179 lb (81.2 kg)  07/07/17 179 lb (81.2 kg)  07/07/17 178 lb 6.4 oz (80.9 kg)   Immunization History  Administered Date(s) Administered  . Influenza,inj,Quad PF,6+ Mos 04/02/2014, 02/13/2015, 02/05/2016  . Pneumococcal Conjugate-13 04/02/2014  . Pneumococcal Polysaccharide-23 02/05/2016  . Tdap 11/11/2012    Review of Systems  Constitutional: Negative for activity change, appetite change, chills, diaphoresis, fatigue, fever and unexpected weight change.  HENT: Negative for congestion, dental problem,  drooling, ear discharge, ear pain, facial swelling, hearing loss, mouth sores, nosebleeds, postnasal drip, rhinorrhea, sinus pressure, sneezing, sore throat, tinnitus, trouble swallowing and voice change.   Eyes: Negative for photophobia, pain, discharge, redness, itching and visual disturbance.  Respiratory: Negative for apnea, cough, choking, chest tightness, shortness of breath, wheezing and stridor.   Cardiovascular: Negative for chest pain, palpitations and leg swelling.  Gastrointestinal: Negative for abdominal distention, abdominal pain, anal bleeding, blood in stool, constipation, diarrhea, nausea, rectal pain and vomiting.  Endocrine: Negative for cold intolerance, heat intolerance, polydipsia, polyphagia and polyuria.  Genitourinary: Negative for decreased urine volume, difficulty urinating, dyspareunia, dysuria, enuresis, flank pain, frequency, genital sores, hematuria, menstrual problem, pelvic pain, urgency, vaginal bleeding, vaginal discharge and vaginal pain.  Musculoskeletal: Negative for arthralgias, back pain, gait problem, joint swelling, myalgias, neck pain and neck stiffness.  Skin: Negative for color change, pallor, rash and wound.  Allergic/Immunologic: Negative for environmental allergies, food allergies and immunocompromised state.  Neurological: Negative for dizziness, tremors, seizures, syncope, facial asymmetry, speech difficulty, weakness, light-headedness, numbness and headaches.  Hematological: Negative for adenopathy. Does not bruise/bleed easily.  Psychiatric/Behavioral: Negative for agitation, behavioral problems, confusion, decreased concentration, dysphoric mood, hallucinations, self-injury, sleep disturbance and suicidal ideas. The patient is not nervous/anxious and is not hyperactive.     Past Medical History:  Diagnosis Date  . Abnormal EKG 07/31/2013  . Arthritis    "hands" (03/06/2015)  . Asthma   . Carpal tunnel syndrome, bilateral   . Chronic kidney  disease   . Complication of anesthesia    slow to wake up  . Coronary artery disease     2 v CAD with CTO of the RCA and high grade bifurcational LCx/OM stenosis. S/P PCI DES x 2 to the LCx/OM.  Marland Kitchen Diabetic peripheral  neuropathy ([place]) "since 1996"  . GERD (gastroesophageal reflux disease)   . Goiter   . Headache    migraines prior to menopause  . History of shingles 06/01/2013  . Hyperlipidemia LDL goal <70 10/13/2015  . Hypertension   . Hyperthyroidism   . PAF (paroxysmal atrial fibrillation) ([place]) 04/29/2015   CHADS2VASC score of 4 now on Apixaban  . Pneumonia ~ 1976  . Tremors of nervous system   . Type II diabetes mellitus ([facility])    insulin dependent   Past Surgical History:  Procedure Laterality Date  . ABDOMINAL HYSTERECTOMY  1988   age 85; CERVICAL DYSPLASIA; ovaries intact.   . AMPUTATION Right 01/23/2016   Procedure: Right 3rd Ray Amputation;  Surgeon: [name], MD;  Location: [facility];  Service: Orthopedics;  Laterality: Right;  . AMPUTATION Right 02/13/2016   Procedure: Right Transmetatarsal Amputation;  Surgeon: [name], MD;  Location: [facility];  Service: Orthopedics;  Laterality: Right;  . CARDIAC CATHETERIZATION N/A 02/27/2015   Procedure: Left Heart Cath and Coronary Angiography;  Surgeon: [name], MD; LAD 40%, mCFX 80%, OM 70%, RCA 100% calcified       . CARDIAC CATHETERIZATION N/A 03/06/2015   Procedure: Coronary Stent Intervention;  Surgeon: [name], MD;  Location: [facility];  Service: Cardiovascular;  Laterality: N/A;  Mid CX 3.50x12 promus DES w/ 0% resdual and Prox OM1 2.50x20 promus DES w/ 20% residual  . CARPAL TUNNEL RELEASE Right Nov 2015  . CARPAL TUNNEL RELEASE Right 1992; 05/2014   [name]; [place]  . CESAREAN SECTION  1982; 1984  . FOOT NEUROMA SURGERY Bilateral 2000  . KNEE ARTHROSCOPY Right ~ 2003   "meniscus repair"  . SHOULDER OPEN ROTATOR CUFF REPAIR Right 1996; 1998   "w/fracture repair"  . THYROID SURGERY  2000    "removed lots of nodules"  . TONSILLECTOMY  1976   Allergies  Allergen Reactions  . Dilaudid [Hydromorphone Hcl] Other (See Comments)    HEADACHE  . Novolog [Insulin Aspart] Shortness Of Breath    "breathing problems"  . Codeine Nausea And Vomiting    HIGH DOSES-SEVERE VOMITING  . Iodine Other (See Comments)    MUST HAVE BENADRYL PRIOR TO PROCEDURE AND RIGHT BEFORE TREATMENT TO COUNTERACT REACTION-BLISTERING REACTION DERMATOLOGICAL  . Penicillins Itching, Rash and Other (See Comments)    Has patient had a PCN reaction causing immediate rash, facial/tongue/throat swelling, SOB or lightheadedness with hypotension: Yes Has patient had a PCN reaction causing severe rash involving mucus membranes or skin necrosis: No Has patient had a PCN reaction that required hospitalization No Has patient had a PCN reaction occurring within the last 10 years: No If all of the above answers are "NO", then may proceed with Cephalosporin use.  CHEST SIZED RASH AND ITCHING   . Propofol Other (See Comments)    "Breathing problems - asthma attack" Can take with benadryl   . Ace Inhibitors Cough  . Demerol [Meperidine] Nausea And Vomiting  . Neosporin [Neomycin-Bacitracin Zn-Polymyx] Itching and Rash    MAKES REACTIONS WORSE WHEN USING AS PROPHYLACTIC  . Percocet [Oxycodone-Acetaminophen] Rash  . Tape Itching and Rash   Current Outpatient Medications on File Prior to Visit  Medication Sig Dispense Refill  . albuterol (PROVENTIL HFA) 108 (90 Base) MCG/ACT inhaler Inhale 1-2 puffs into the lungs every 6 (six) hours as needed for wheezing or shortness of breath. 1 Inhaler 0  . atorvastatin (LIPITOR) 20 MG tablet Take 1 tablet (20 mg total)  by mouth daily. 90 tablet 1  . buPROPion (WELLBUTRIN SR) 150 MG 12 hr tablet TAKE ONE TABLET BY MOUTH TWICE DAILY 60 tablet 11  . clopidogrel (PLAVIX) 75 MG tablet Take 1 tablet (75 mg total) by mouth daily. Please make yearly appt with Dr. Radford Pax for May before anymore  refills. 1st attempt 90 tablet 1  . ELIQUIS 5 MG TABS tablet TAKE ONE TABLET BY MOUTH TWICE DAILY 60 tablet 5  . fenofibrate (TRICOR) 145 MG tablet Take 1 tablet (145 mg total) by mouth daily. 90 tablet 3  . FLUoxetine (PROZAC) 20 MG capsule TAKE ONE CAPSULE BY MOUTH AT BEDTIME 30 capsule 11  . fluticasone (FLOVENT HFA) 44 MCG/ACT inhaler Inhale 2 puffs into the lungs 2 (two) times daily as needed (shortness of breath/ wheezing).     . gabapentin (NEURONTIN) 300 MG capsule Take 3 capsules (900 mg total) by mouth 2 (two) times daily. TAKE 3 CAPSULES BY MOUTH EVERY  MORNING AND 3 CAPSULES AT BEDTIME (Patient taking differently: Take 900 mg by mouth 2 (two) times daily. ) 540 capsule 3  . insulin degludec (TRESIBA FLEXTOUCH) 100 UNIT/ML SOPN FlexTouch Pen Inject 0.2 mLs (20 Units total) into the skin daily at 10 pm. 15 mL 0  . Insulin Pen Needle 32G X 4 MM MISC Use to inject insulin daily 100 each 5  . insulin regular (NOVOLIN R RELION) 100 units/mL injection 3 times a day (just before each meal), 15-25-10 units 20 mL 11  . Insulin Syringe-Needle U-100 (INSULIN SYRINGE .5CC/31GX5/16") 31G X 5/16" 0.5 ML MISC Use to inject insulin 100 each 5  . magnesium oxide (MAG-OX) 400 MG tablet Take 1 tablet (400 mg total) by mouth daily. 30 tablet 6  . metFORMIN (GLUCOPHAGE-XR) 500 MG 24 hr tablet Take 4 tablets (2,000 mg total) daily with breakfast by mouth. (Patient taking differently: Take 1,000 mg by mouth 2 (two) times daily. ) 360 tablet 3  . methimazole (TAPAZOLE) 10 MG tablet TAKE ONE TABLET BY MOUTH ONCE DAILY 30 tablet 11  . metoprolol succinate (TOPROL-XL) 25 MG 24 hr tablet TAKE ONE TABLET BY MOUTH TWICE DAILY 180 tablet 2  . nitroGLYCERIN (NITROSTAT) 0.4 MG SL tablet Place 1 tablet (0.4 mg total) under the tongue every 5 (five) minutes as needed for chest pain. 25 tablet 3  . pantoprazole (PROTONIX) 40 MG tablet Take 1 tablet (40 mg total) by mouth daily. 90 tablet 3  . silver sulfADIAZINE (SILVADENE)  1 % cream Apply 1 application topically daily. 50 g 1  . sotalol (BETAPACE) 80 MG tablet TAKE ONE TABLET BY MOUTH EVERY 12 HOURS 180 tablet 2  . SSD 1 % cream APPLY TO AFFECTED AREA ONCE DAILY 50 g 0  . traMADol (ULTRAM) 50 MG tablet Take 1 tablet (50 mg total) by mouth every 8 (eight) hours as needed. (Patient taking differently: Take 50 mg by mouth every 6 (six) hours as needed for moderate pain. ) 90 tablet 1   No current facility-administered medications on file prior to visit.    Social History   Socioeconomic History  . Marital status: Single    Spouse name: Not on file  . Number of children: 2  . Years of education: Masters  . Highest education level: Not on file  Social Needs  . Financial resource strain: Not on file  . Food insecurity - worry: Not on file  . Food insecurity - inability: Not on file  . Transportation needs - medical: Not on  file  . Transportation needs - non-medical: Not on file  Occupational History  . Occupation: Landscape architect  Tobacco Use  . Smoking status: Former Smoker    Packs/day: 0.00    Years: 41.00    Pack years: 0.00    Types: Cigarettes    Last attempt to quit: 03/05/2015    Years since quitting: 2.3  . Smokeless tobacco: Never Used  . Tobacco comment: 04/29/2015 "quit smoking cigarettes 02/27/2015"  Substance and Sexual Activity  . Alcohol use: No  . Drug use: No  . Sexual activity: Not Currently    Birth control/protection: Post-menopausal, Surgical  Other Topics Concern  . 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      Lives with sister in [place].      Tobacco: 1 ppd x 41 years - quit 2016      Alcohol: none      Drugs: none      Exercise:  Walking in neighborhood; physical job.   Right-handed.     2 cups caffeine daily.   Family History  Problem Relation Age of Onset  . Cancer Mother 20       bronchial cancer  . Breast cancer Mother   . Lung cancer Mother   . Hypertension Father   . COPD Father   . Heart disease Father 65       CAD with cardiac stenting  . Heart attack Father   . Parkinson's disease Father   . Allergies Sister   . Breast cancer Maternal Grandmother   . Emphysema Maternal Grandfather   . Leukemia Paternal Grandmother   . Emphysema Paternal Grandfather   . Thyroid disease Neg Hx        Objective:    BP (!) 148/72   Pulse 67   Temp 98 F (36.7 C) (Oral)   Resp 16   Ht 5' 5.35" (1.66 m)   Wt 179 lb (81.2 kg)   SpO2 95%   BMI 29.47 kg/m  Physical Exam  Constitutional: She is oriented to person, place, and time. She appears well-developed and well-nourished. No distress.  HENT:  Head: Normocephalic and atraumatic.  Right Ear: Hearing, tympanic membrane, external ear and ear canal normal.  Left Ear: Hearing, tympanic membrane, external ear and ear canal normal.  Nose: Nose normal.  Mouth/Throat: Oropharynx is clear and moist.  Eyes: Pupils are equal, round, and reactive to light. Conjunctivae and EOM are normal.  Neck: Normal range of motion and full passive range of motion without pain. Neck supple. No JVD present. Carotid bruit is not present. No thyromegaly present.  Cardiovascular: Normal rate, regular rhythm, normal heart sounds and intact distal pulses. Exam reveals no gallop and no friction rub.  No murmur heard. Pulmonary/Chest: Effort normal and breath sounds normal. No respiratory distress. She has no wheezes. She has no rales. Right breast exhibits no inverted nipple, no mass, no nipple discharge, no skin change and no tenderness. Left breast exhibits no inverted nipple, no mass, no nipple discharge, no skin change and no tenderness. Breasts are symmetrical.  Abdominal: Soft. Bowel sounds are normal. She exhibits no distension and no  mass. There is no tenderness. There is no rebound and no guarding.  Musculoskeletal:  Right shoulder: Normal.       Left shoulder: Normal.       Cervical back: Normal.  Lymphadenopathy:    She has no cervical adenopathy.  Neurological: She is alert and oriented to person, place, and time. She has normal reflexes. No cranial nerve deficit. She exhibits normal muscle tone. Coordination normal.  Skin: Skin is warm and dry. No rash noted. She is not diaphoretic. No erythema. No pallor.  Psychiatric: She has a normal mood and affect. Her behavior is normal. Judgment and thought content normal.  Nursing note and vitals reviewed.  No results found. Depression screen [facility] 2/9 07/22/2017 03/07/2017 02/24/2017 02/10/2017 02/03/2017  Decreased Interest 0 0 0 0 0  Down, Depressed, Hopeless 0 0 0 0 0  PHQ - 2 Score 0 0 0 0 0   Fall Risk  07/22/2017 03/07/2017 02/24/2017 02/10/2017 02/03/2017  Falls in the past year? No Yes Yes Yes Yes  Number falls in past yr: - 2 or more 2 or more 2 or more 2 or more  Injury with Fall? - Yes Yes No -  Comment - black eyes and lump over right eye - - -  Risk for fall due to : - - - - -  Risk for fall due to: Comment - - - - -        Assessment & Plan:   1. Atrial fibrillation with RVR ([place])   2. Essential hypertension   3. Coronary artery disease of native artery of native heart with stable angina pectoris ([place])   4. Type 2 diabetes mellitus with hyperosmolar coma, with long-term current use of insulin ([place])   5. Hypomagnesemia   6. Hyperlipidemia LDL goal <70    S/p admission for chest pain and atrial fibrillation; doing well since hospital discharge.  S/p follow-up in Atrial fibrillation clinic on 07/07/17.  Tolerating Metoprolol and can take additional 1/2 dose if atrial fibrillation recurs with RVR.    Hypertension: elevated today slightly; continue to monitor closely; if remains elevated at next visit, adjust medications.  Hypomagnesemia: New onset;  repeat magnesium and potassium levels today. Recheck with cardiology on 07/07/17 and magnesium remaining low.  Question absorption issue due to chronic PPI.   DMII: uncontrolled due to financial limitations with insulin therapy; case management assistance during admission; managed by Dr. Loanne Drilling; recent follow-up on 07/07/17.  Known diabetic PN.    No orders of the defined types were placed in this encounter.  No orders of the defined types were placed in this encounter.   No Follow-up on file.   Judith [name], M.D. Primary Care at [facility] [address] [place], [place]  [address] [phone] phone [phone] fax

## 2017-07-22 ENCOUNTER — Other Ambulatory Visit: Payer: Self-pay

## 2017-07-22 ENCOUNTER — Ambulatory Visit: Payer: Medicare PPO | Admitting: Family Medicine

## 2017-07-22 ENCOUNTER — Encounter: Payer: Self-pay | Admitting: Family Medicine

## 2017-07-22 VITALS — BP 138/72 | HR 67 | Temp 98.0°F | Resp 16 | Ht 65.35 in | Wt 179.0 lb

## 2017-07-22 DIAGNOSIS — Z89431 Acquired absence of right foot: Secondary | ICD-10-CM

## 2017-07-22 DIAGNOSIS — I4891 Unspecified atrial fibrillation: Secondary | ICD-10-CM | POA: Diagnosis not present

## 2017-07-22 DIAGNOSIS — E785 Hyperlipidemia, unspecified: Secondary | ICD-10-CM

## 2017-07-22 DIAGNOSIS — I472 Ventricular tachycardia: Secondary | ICD-10-CM

## 2017-07-22 DIAGNOSIS — E1101 Type 2 diabetes mellitus with hyperosmolarity with coma: Secondary | ICD-10-CM

## 2017-07-22 DIAGNOSIS — Z794 Long term (current) use of insulin: Secondary | ICD-10-CM

## 2017-07-22 DIAGNOSIS — I25118 Atherosclerotic heart disease of native coronary artery with other forms of angina pectoris: Secondary | ICD-10-CM

## 2017-07-22 DIAGNOSIS — I10 Essential (primary) hypertension: Secondary | ICD-10-CM | POA: Diagnosis not present

## 2017-07-22 DIAGNOSIS — R000 Tachycardia, unspecified: Secondary | ICD-10-CM

## 2017-07-22 LAB — POCT URINALYSIS DIP (MANUAL ENTRY)
Bilirubin, UA: NEGATIVE
Blood, UA: NEGATIVE
GLUCOSE UA: NEGATIVE mg/dL
Ketones, POC UA: NEGATIVE mg/dL
Nitrite, UA: NEGATIVE
SPEC GRAV UA: 1.025 (ref 1.010–1.025)
UROBILINOGEN UA: 0.2 U/dL
pH, UA: 7.5 (ref 5.0–8.0)

## 2017-07-22 LAB — GLUCOSE, POCT (MANUAL RESULT ENTRY): POC Glucose: 131 mg/dl — AB (ref 70–99)

## 2017-07-22 NOTE — Patient Instructions (Addendum)
   IF you received an x-ray today, you will receive an invoice from [facility]. Please contact [facility] at [phone] with questions or concerns regarding your invoice.   IF you received labwork today, you will receive an invoice from [facility]. Please contact [facility] at [phone] with questions or concerns regarding your invoice.   Our billing staff will not be able to assist you with questions regarding bills from these companies.  You will be contacted with the lab results as soon as they are available. The fastest way to get your results is to activate your My Chart account. Instructions are located on the last page of this paperwork. If you have not heard from us regarding the results in 2 weeks, please contact this office.      Managing Your Hypertension Hypertension is commonly called high blood pressure. This is when the force of your blood pressing against the walls of your arteries is too strong. Arteries are blood vessels that carry blood from your heart throughout your body. Hypertension forces the heart to work harder to pump blood, and may cause the arteries to become narrow or stiff. Having untreated or uncontrolled hypertension can cause heart attack, stroke, kidney disease, and other problems. What are blood pressure readings? A blood pressure reading consists of a higher number over a lower number. Ideally, your blood pressure should be below 120/80. The first ("top") number is called the systolic pressure. It is a measure of the pressure in your arteries as your heart beats. The second ("bottom") number is called the diastolic pressure. It is a measure of the pressure in your arteries as the heart relaxes. What does my blood pressure reading mean? Blood pressure is classified into four stages. Based on your blood pressure reading, your health care provider may use the following stages to determine what type of treatment you need, if any. Systolic  pressure and diastolic pressure are measured in a unit called mm Hg. Normal  Systolic pressure: below 120.  Diastolic pressure: below 80. Elevated  Systolic pressure: 120-129.  Diastolic pressure: below 80. Hypertension stage 1  Systolic pressure: 130-139.  Diastolic pressure: 80-89. Hypertension stage 2  Systolic pressure: 140 or above.  Diastolic pressure: 90 or above. What health risks are associated with hypertension? Managing your hypertension is an important responsibility. Uncontrolled hypertension can lead to:  A heart attack.  A stroke.  A weakened blood vessel (aneurysm).  Heart failure.  Kidney damage.  Eye damage.  Metabolic syndrome.  Memory and concentration problems.  What changes can I make to manage my hypertension? Hypertension can be managed by making lifestyle changes and possibly by taking medicines. Your health care provider will help you make a plan to bring your blood pressure within a normal range. Eating and drinking  Eat a diet that is high in fiber and potassium, and low in salt (sodium), added sugar, and fat. An example eating plan is called the DASH (Dietary Approaches to Stop Hypertension) diet. To eat this way: ? Eat plenty of fresh fruits and vegetables. Try to fill half of your plate at each meal with fruits and vegetables. ? Eat whole grains, such as whole wheat pasta, brown rice, or whole grain bread. Fill about one quarter of your plate with whole grains. ? Eat low-fat diary products. ? Avoid fatty cuts of meat, processed or cured meats, and poultry with skin. Fill about one quarter of your plate with lean proteins such as fish, chicken without skin, beans, eggs,   and tofu. ? Avoid premade and processed foods. These tend to be higher in sodium, added sugar, and fat.  Reduce your daily sodium intake. Most people with hypertension should eat less than 1,500 mg of sodium a day.  Limit alcohol intake to no more than 1 drink a day  for nonpregnant women and 2 drinks a day for men. One drink equals 12 oz of beer, 5 oz of wine, or 1 oz of hard liquor. Lifestyle  Work with your health care provider to maintain a healthy body weight, or to lose weight. Ask what an ideal weight is for you.  Get at least 30 minutes of exercise that causes your heart to beat faster (aerobic exercise) most days of the week. Activities may include walking, swimming, or biking.  Include exercise to strengthen your muscles (resistance exercise), such as weight lifting, as part of your weekly exercise routine. Try to do these types of exercises for 30 minutes at least 3 days a week.  Do not use any products that contain nicotine or tobacco, such as cigarettes and e-cigarettes. If you need help quitting, ask your health care provider.  Control any long-term (chronic) conditions you have, such as high cholesterol or diabetes. Monitoring  Monitor your blood pressure at home as told by your health care provider. Your personal target blood pressure may vary depending on your medical conditions, your age, and other factors.  Have your blood pressure checked regularly, as often as told by your health care provider. Working with your health care provider  Review all the medicines you take with your health care provider because there may be side effects or interactions.  Talk with your health care provider about your diet, exercise habits, and other lifestyle factors that may be contributing to hypertension.  Visit your health care provider regularly. Your health care provider can help you create and adjust your plan for managing hypertension. Will I need medicine to control my blood pressure? Your health care provider may prescribe medicine if lifestyle changes are not enough to get your blood pressure under control, and if:  Your systolic blood pressure is 130 or higher.  Your diastolic blood pressure is 80 or higher.  Take medicines only as told  by your health care provider. Follow the directions carefully. Blood pressure medicines must be taken as prescribed. The medicine does not work as well when you skip doses. Skipping doses also puts you at risk for problems. Contact a health care provider if:  You think you are having a reaction to medicines you have taken.  You have repeated (recurrent) headaches.  You feel dizzy.  You have swelling in your ankles.  You have trouble with your vision. Get help right away if:  You develop a severe headache or confusion.  You have unusual weakness or numbness, or you feel faint.  You have severe pain in your chest or abdomen.  You vomit repeatedly.  You have trouble breathing. Summary  Hypertension is when the force of blood pumping through your arteries is too strong. If this condition is not controlled, it may put you at risk for serious complications.  Your personal target blood pressure may vary depending on your medical conditions, your age, and other factors. For most people, a normal blood pressure is less than 120/80.  Hypertension is managed by lifestyle changes, medicines, or both. Lifestyle changes include weight loss, eating a healthy, low-sodium diet, exercising more, and limiting alcohol. This information is not intended to replace advice   given to you by your health care provider. Make sure you discuss any questions you have with your health care provider. Document Released: 01/19/2012 Document Revised: 03/24/2016 Document Reviewed: 03/24/2016 Elsevier Interactive Patient Education  2018 [employer]  

## 2017-07-23 LAB — CBC WITH DIFFERENTIAL/PLATELET
BASOS ABS: 0.1 10*3/uL (ref 0.0–0.2)
Basos: 1 %
EOS (ABSOLUTE): 0.3 10*3/uL (ref 0.0–0.4)
EOS: 4 %
HEMATOCRIT: 36.3 % (ref 34.0–46.6)
HEMOGLOBIN: 12.2 g/dL (ref 11.1–15.9)
Immature Grans (Abs): 0 10*3/uL (ref 0.0–0.1)
Immature Granulocytes: 0 %
Lymphocytes Absolute: 1.8 10*3/uL (ref 0.7–3.1)
Lymphs: 25 %
MCH: 29.3 pg (ref 26.6–33.0)
MCHC: 33.6 g/dL (ref 31.5–35.7)
MCV: 87 fL (ref 79–97)
MONOCYTES: 8 %
Monocytes Absolute: 0.6 10*3/uL (ref 0.1–0.9)
NEUTROS PCT: 62 %
Neutrophils Absolute: 4.4 10*3/uL (ref 1.4–7.0)
Platelets: 374 10*3/uL (ref 150–379)
RBC: 4.17 x10E6/uL (ref 3.77–5.28)
RDW: 14.3 % (ref 12.3–15.4)
WBC: 7.1 10*3/uL (ref 3.4–10.8)

## 2017-07-23 LAB — COMPREHENSIVE METABOLIC PANEL
ALBUMIN: 4 g/dL (ref 3.6–4.8)
ALK PHOS: 65 IU/L (ref 39–117)
ALT: 11 IU/L (ref 0–32)
AST: 17 IU/L (ref 0–40)
Albumin/Globulin Ratio: 1.5 (ref 1.2–2.2)
BUN / CREAT RATIO: 32 — AB (ref 12–28)
BUN: 25 mg/dL (ref 8–27)
Bilirubin Total: <0.2 mg/dL (ref 0.0–1.2)
CO2: 24 mmol/L (ref 20–29)
CREATININE: 0.78 mg/dL (ref 0.57–1.00)
Calcium: 9.3 mg/dL (ref 8.7–10.3)
Chloride: 100 mmol/L (ref 96–106)
GFR calc Af Amer: 92 mL/min/{1.73_m2} (ref >59)
GFR, EST NON AFRICAN AMERICAN: 80 mL/min/{1.73_m2} (ref >59)
GLOBULIN, TOTAL: 2.7 g/dL (ref 1.5–4.5)
GLUCOSE: 152 mg/dL — AB (ref 65–99)
Potassium: 4.2 mmol/L (ref 3.5–5.2)
SODIUM: 141 mmol/L (ref 134–144)
Total Protein: 6.7 g/dL (ref 6.0–8.5)

## 2017-07-23 LAB — LIPID PANEL
CHOLESTEROL TOTAL: 236 mg/dL — AB (ref 100–199)
Chol/HDL Ratio: 7.9 ratio — ABNORMAL HIGH (ref 0.0–4.4)
HDL: 30 mg/dL — ABNORMAL LOW (ref >39)
TRIGLYCERIDES: 544 mg/dL — AB (ref 0–149)

## 2017-07-23 LAB — MICROALBUMIN / CREATININE URINE RATIO
CREATININE, UR: 72.8 mg/dL
MICROALB/CREAT RATIO: 2908 mg/g{creat} — AB (ref 0.0–30.0)
MICROALBUM., U, RANDOM: 2117 ug/mL

## 2017-07-23 LAB — MAGNESIUM: MAGNESIUM: 1.2 mg/dL — AB (ref 1.6–2.3)

## 2017-07-26 ENCOUNTER — Ambulatory Visit (INDEPENDENT_AMBULATORY_CARE_PROVIDER_SITE_OTHER): Payer: Medicare PPO

## 2017-07-26 ENCOUNTER — Ambulatory Visit: Payer: Medicare PPO

## 2017-07-26 VITALS — BP 118/72 | HR 66 | Ht 64.0 in | Wt 175.4 lb

## 2017-07-26 DIAGNOSIS — Z594 Lack of adequate food and safe drinking water: Secondary | ICD-10-CM | POA: Diagnosis not present

## 2017-07-26 DIAGNOSIS — Z5948 Other specified lack of adequate food: Secondary | ICD-10-CM

## 2017-07-26 DIAGNOSIS — Z0000 Encounter for general adult medical examination without abnormal findings: Secondary | ICD-10-CM | POA: Diagnosis not present

## 2017-07-26 NOTE — Patient Instructions (Addendum)
Judith [name] , Thank you for taking time to come for your Medicare Wellness Visit. I appreciate your ongoing commitment to your health goals. Please review the following plan we discussed and let me know if I can assist you in the future.   Screening recommendations/referrals: Colonoscopy: up to date, next due 11/06/2026 Mammogram: up to date, next due 03/15/2019 Bone Density: up to date, next due 12/26/2019 Recommended yearly ophthalmology/optometry visit for glaucoma screening and checkup Recommended yearly dental visit for hygiene and checkup  Vaccinations: Influenza vaccine: up to date Pneumococcal vaccine: up to date Tdap vaccine: up to date, next due 11/12/2022 Shingles vaccine: Check with your pharmacy about receiving the Shingrix vaccine     Advanced directives: Advance directive discussed with you today. I have provided a copy for you to complete at home and have notarized. Once this is complete please bring a copy in to our office so we can scan it into your chart.  Conditions/risks identified:  Try to lose at least 10% of her body weight.  Next appointment: 01/23/2018 @ 8 am with Judith [name], next AWV in 1 year    Preventive Care 65 Years and Older, Female Preventive care refers to lifestyle choices and visits with your health care provider that can promote health and wellness. What does preventive care include?  A yearly physical exam. This is also called an annual well check.  Dental exams once or twice a year.  Routine eye exams. Ask your health care provider how often you should have your eyes checked.  Personal lifestyle choices, including:  Daily care of your teeth and gums.  Regular physical activity.  Eating a healthy diet.  Avoiding tobacco and drug use.  Limiting alcohol use.  Practicing safe sex.  Taking low-dose aspirin every day.  Taking vitamin and mineral supplements as recommended by your health care provider. What happens during an annual well  check? The services and screenings done by your health care provider during your annual well check will depend on your age, overall health, lifestyle risk factors, and family history of disease. Counseling  Your health care provider may ask you questions about your:  Alcohol use.  Tobacco use.  Drug use.  Emotional well-being.  Home and relationship well-being.  Sexual activity.  Eating habits.  History of falls.  Memory and ability to understand (cognition).  Work and work Statistician.  Reproductive health. Screening  You may have the following tests or measurements:  Height, weight, and BMI.  Blood pressure.  Lipid and cholesterol levels. These may be checked every 5 years, or more frequently if you are over 30 years old.  Skin check.  Lung cancer screening. You may have this screening every year starting at age 80 if you have a 30-pack-year history of smoking and currently smoke or have quit within the past 15 years.  Fecal occult blood test (FOBT) of the stool. You may have this test every year starting at age 28.  Flexible sigmoidoscopy or colonoscopy. You may have a sigmoidoscopy every 5 years or a colonoscopy every 10 years starting at age 75.  Hepatitis C blood test.  Hepatitis B blood test.  Sexually transmitted disease (STD) testing.  Diabetes screening. This is done by checking your blood sugar (glucose) after you have not eaten for a while (fasting). You may have this done every 1-3 years.  Bone density scan. This is done to screen for osteoporosis. You may have this done starting at age 28.  Mammogram. This may  be done every 1-2 years. Talk to your health care provider about how often you should have regular mammograms. Talk with your health care provider about your test results, treatment options, and if necessary, the need for more tests. Vaccines  Your health care provider may recommend certain vaccines, such as:  Influenza vaccine. This is  recommended every year.  Tetanus, diphtheria, and acellular pertussis (Tdap, Td) vaccine. You may need a Td booster every 10 years.  Zoster vaccine. You may need this after age 68.  Pneumococcal 13-valent conjugate (PCV13) vaccine. One dose is recommended after age 40.  Pneumococcal polysaccharide (PPSV23) vaccine. One dose is recommended after age 4. Talk to your health care provider about which screenings and vaccines you need and how often you need them. This information is not intended to replace advice given to you by your health care provider. Make sure you discuss any questions you have with your health care provider. Document Released: 05/23/2015 Document Revised: 01/14/2016 Document Reviewed: 02/25/2015 Elsevier Interactive Patient Education  2017 [employer] Prevention in the Home Falls can cause injuries. They can happen to people of all ages. There are many things you can do to make your home safe and to help prevent falls. What can I do on the outside of my home?  Regularly fix the edges of walkways and driveways and fix any cracks.  Remove anything that might make you trip as you walk through a door, such as a raised step or threshold.  Trim any bushes or trees on the path to your home.  Use bright outdoor lighting.  Clear any walking paths of anything that might make someone trip, such as rocks or tools.  Regularly check to see if handrails are loose or broken. Make sure that both sides of any steps have handrails.  Any raised decks and porches should have guardrails on the edges.  Have any leaves, snow, or ice cleared regularly.  Use sand or salt on walking paths during winter.  Clean up any spills in your garage right away. This includes oil or grease spills. What can I do in the bathroom?  Use night lights.  Install grab bars by the toilet and in the tub and shower. Do not use towel bars as grab bars.  Use non-skid mats or decals in the tub or  shower.  If you need to sit down in the shower, use a plastic, non-slip stool.  Keep the floor dry. Clean up any water that spills on the floor as soon as it happens.  Remove soap buildup in the tub or shower regularly.  Attach bath mats securely with double-sided non-slip rug tape.  Do not have throw rugs and other things on the floor that can make you trip. What can I do in the bedroom?  Use night lights.  Make sure that you have a light by your bed that is easy to reach.  Do not use any sheets or blankets that are too big for your bed. They should not hang down onto the floor.  Have a firm chair that has side arms. You can use this for support while you get dressed.  Do not have throw rugs and other things on the floor that can make you trip. What can I do in the kitchen?  Clean up any spills right away.  Avoid walking on wet floors.  Keep items that you use a lot in easy-to-reach places.  If you need to reach something above you,  use a strong step stool that has a grab bar.  Keep electrical cords out of the way.  Do not use floor polish or wax that makes floors slippery. If you must use wax, use non-skid floor wax.  Do not have throw rugs and other things on the floor that can make you trip. What can I do with my stairs?  Do not leave any items on the stairs.  Make sure that there are handrails on both sides of the stairs and use them. Fix handrails that are broken or loose. Make sure that handrails are as long as the stairways.  Check any carpeting to make sure that it is firmly attached to the stairs. Fix any carpet that is loose or worn.  Avoid having throw rugs at the top or bottom of the stairs. If you do have throw rugs, attach them to the floor with carpet tape.  Make sure that you have a light switch at the top of the stairs and the bottom of the stairs. If you do not have them, ask someone to add them for you. What else can I do to help prevent  falls?  Wear shoes that:  Do not have high heels.  Have rubber bottoms.  Are comfortable and fit you well.  Are closed at the toe. Do not wear sandals.  If you use a stepladder:  Make sure that it is fully opened. Do not climb a closed stepladder.  Make sure that both sides of the stepladder are locked into place.  Ask someone to hold it for you, if possible.  Clearly mark and make sure that you can see:  Any grab bars or handrails.  First and last steps.  Where the edge of each step is.  Use tools that help you move around (mobility aids) if they are needed. These include:  Canes.  Walkers.  Scooters.  Crutches.  Turn on the lights when you go into a dark area. Replace any light bulbs as soon as they burn out.  Set up your furniture so you have a clear path. Avoid moving your furniture around.  If any of your floors are uneven, fix them.  If there are any pets around you, be aware of where they are.  Review your medicines with your doctor. Some medicines can make you feel dizzy. This can increase your chance of falling. Ask your doctor what other things that you can do to help prevent falls. This information is not intended to replace advice given to you by your health care provider. Make sure you discuss any questions you have with your health care provider. Document Released: 02/20/2009 Document Revised: 10/02/2015 Document Reviewed: 05/31/2014 Elsevier Interactive Patient Education  2017 [employer].

## 2017-07-26 NOTE — Progress Notes (Signed)
Subjective:   Judith [name] is a 67 y.o. female who presents for an Initial Medicare Annual Wellness Visit.  Review of Systems    N/A  Cardiac Risk Factors include: advanced age (>24men, >15 women);diabetes mellitus;dyslipidemia;hypertension;obesity (BMI >30kg/m2)     Objective:    Today's Vitals   07/26/17 1006 07/26/17 1015  BP: 118/72   Pulse: 66   SpO2: 95%   Weight: 175 lb 6 oz (79.5 kg)   Height: 5\' 4"  (1.626 m)   PainSc:  0-No pain   Body mass index is 30.1 kg/m.  Advanced Directives 07/26/2017 07/04/2017 06/04/2017 08/25/2016 08/06/2016 05/10/2016 05/09/2016  Does Patient Have a Medical Advance Directive? No Yes No No No No No  Type of Advance Directive - Healthcare Power of [place];Living will - - - - -  Does patient want to make changes to medical advance directive? Yes (MAU/Ambulatory/Procedural Areas - Information given) No - Patient declined - - - - -  Copy of Healthcare Power of Attorney in Chart? - No - copy requested - - - - -  Would patient like information on creating a medical advance directive? - - No - Patient declined - - No - Patient declined -    Current Medications (verified) Outpatient Encounter Medications as of 07/26/2017  Medication Sig  . albuterol (PROVENTIL HFA) 108 (90 Base) MCG/ACT inhaler Inhale 1-2 puffs into the lungs every 6 (six) hours as needed for wheezing or shortness of breath.  Marland Kitchen atorvastatin (LIPITOR) 20 MG tablet Take 1 tablet (20 mg total) by mouth daily.  Marland Kitchen buPROPion (WELLBUTRIN SR) 150 MG 12 hr tablet TAKE ONE TABLET BY MOUTH TWICE DAILY  . clopidogrel (PLAVIX) 75 MG tablet Take 1 tablet (75 mg total) by mouth daily. Please make yearly appt with Dr. Radford Pax for May before anymore refills. 1st attempt  . ELIQUIS 5 MG TABS tablet TAKE ONE TABLET BY MOUTH TWICE DAILY  . fenofibrate (TRICOR) 145 MG tablet Take 1 tablet (145 mg total) by mouth daily.  Marland Kitchen FLUoxetine (PROZAC) 20 MG capsule TAKE ONE CAPSULE BY MOUTH AT BEDTIME  . fluticasone  (FLOVENT HFA) 44 MCG/ACT inhaler Inhale 2 puffs into the lungs 2 (two) times daily as needed (shortness of breath/ wheezing).   . gabapentin (NEURONTIN) 300 MG capsule Take 3 capsules (900 mg total) by mouth 2 (two) times daily. TAKE 3 CAPSULES BY MOUTH EVERY  MORNING AND 3 CAPSULES AT BEDTIME (Patient taking differently: Take 900 mg by mouth 2 (two) times daily. )  . insulin degludec (TRESIBA FLEXTOUCH) 100 UNIT/ML SOPN FlexTouch Pen Inject 0.2 mLs (20 Units total) into the skin daily at 10 pm.  . Insulin Pen Needle 32G X 4 MM MISC Use to inject insulin daily  . insulin regular (NOVOLIN R RELION) 100 units/mL injection 3 times a day (just before each meal), 15-25-10 units  . Insulin Syringe-Needle U-100 (INSULIN SYRINGE .5CC/31GX5/16") 31G X 5/16" 0.5 ML MISC Use to inject insulin  . magnesium oxide (MAG-OX) 400 MG tablet Take 1 tablet (400 mg total) by mouth daily.  . metFORMIN (GLUCOPHAGE-XR) 500 MG 24 hr tablet Take 4 tablets (2,000 mg total) daily with breakfast by mouth. (Patient taking differently: Take 1,000 mg by mouth 2 (two) times daily. )  . methimazole (TAPAZOLE) 10 MG tablet TAKE ONE TABLET BY MOUTH ONCE DAILY  . metoprolol succinate (TOPROL-XL) 25 MG 24 hr tablet TAKE ONE TABLET BY MOUTH TWICE DAILY  . nitroGLYCERIN (NITROSTAT) 0.4 MG SL tablet Place 1 tablet (0.4 mg  total) under the tongue every 5 (five) minutes as needed for chest pain.  . pantoprazole (PROTONIX) 40 MG tablet Take 1 tablet (40 mg total) by mouth daily.  . silver sulfADIAZINE (SILVADENE) 1 % cream Apply 1 application topically daily.  . sotalol (BETAPACE) 80 MG tablet TAKE ONE TABLET BY MOUTH EVERY 12 HOURS  . SSD 1 % cream APPLY TO AFFECTED AREA ONCE DAILY  . traMADol (ULTRAM) 50 MG tablet Take 1 tablet (50 mg total) by mouth every 8 (eight) hours as needed. (Patient taking differently: Take 50 mg by mouth every 6 (six) hours as needed for moderate pain. )   No facility-administered encounter medications on file as  of 07/26/2017.     Allergies (verified) Dilaudid [hydromorphone hcl]; Novolog [insulin aspart]; Codeine; Iodine; Penicillins; Propofol; Ace inhibitors; Demerol [meperidine]; Neosporin [neomycin-bacitracin zn-polymyx]; Percocet [oxycodone-acetaminophen]; and Tape   History: Past Medical History:  Diagnosis Date  . Abnormal EKG 07/31/2013  . Arthritis    "hands" (03/06/2015)  . Asthma   . Carpal tunnel syndrome, bilateral   . Chronic kidney disease   . Complication of anesthesia    slow to wake up  . Coronary artery disease     2 v CAD with CTO of the RCA and high grade bifurcational LCx/OM stenosis. S/P PCI DES x 2 to the LCx/OM.  Marland Kitchen Diabetic peripheral neuropathy ([place]) "since 1996"  . GERD (gastroesophageal reflux disease)   . Goiter   . Headache    migraines prior to menopause  . History of shingles 06/01/2013  . Hyperlipidemia LDL goal <70 10/13/2015  . Hypertension   . Hyperthyroidism   . PAF (paroxysmal atrial fibrillation) ([place]) 04/29/2015   CHADS2VASC score of 4 now on Apixaban  . Pneumonia ~ 1976  . Tremors of nervous system   . Type II diabetes mellitus (H[facility])    insulin dependent   Past Surgical History:  Procedure Laterality Date  . ABDOMINAL HYSTERECTOMY  1988   age 10; CERVICAL DYSPLASIA; ovaries intact.   . AMPUTATION Right 01/23/2016   Procedure: Right 3rd Ray Amputation;  Surgeon: [name], MD;  Location: [facility];  Service: Orthopedics;  Laterality: Right;  . AMPUTATION Right 02/13/2016   Procedure: Right Transmetatarsal Amputation;  Surgeon: [name], MD;  Location: [facility];  Service: Orthopedics;  Laterality: Right;  . CARDIAC CATHETERIZATION N/A 02/27/2015   Procedure: Left Heart Cath and Coronary Angiography;  Surgeon: [name], MD; LAD 40%, mCFX 80%, OM 70%, RCA 100% calcified       . CARDIAC CATHETERIZATION N/A 03/06/2015   Procedure: Coronary Stent Intervention;  Surgeon: [name], MD;  Location: [facility];  Service:  Cardiovascular;  Laterality: N/A;  Mid CX 3.50x12 promus DES w/ 0% resdual and Prox OM1 2.50x20 promus DES w/ 20% residual  . CARPAL TUNNEL RELEASE Right Nov 2015  . CARPAL TUNNEL RELEASE Right 1992; 05/2014   [name]; [place]  . CESAREAN SECTION  1982; 1984  . FOOT NEUROMA SURGERY Bilateral 2000  . KNEE ARTHROSCOPY Right ~ 2003   "meniscus repair"  . SHOULDER OPEN ROTATOR CUFF REPAIR Right 1996; 1998   "w/fracture repair"  . THYROID SURGERY  2000   "removed lots of nodules"  . TONSILLECTOMY  1976   Family History  Problem Relation Age of Onset  . Cancer Mother 5       bronchial cancer  . Breast cancer Mother   . Lung cancer Mother   . Hypertension Father   .  COPD Father   . Heart disease Father 64       CAD with cardiac stenting  . Heart attack Father   . Parkinson's disease Father   . Allergies Sister   . Breast cancer Maternal Grandmother   . Emphysema Maternal Grandfather   . Leukemia Paternal Grandmother   . Emphysema Paternal Grandfather   . Thyroid disease Neg Hx    Social History   Socioeconomic History  . Marital status: Single    Spouse name: None  . Number of children: 2  . Years of education: Masters  . Highest education level: None  Social Needs  . Financial resource strain: Somewhat hard  . Food insecurity - worry: Sometimes true  . Food insecurity - inability: Sometimes true  . Transportation needs - medical: No  . Transportation needs - non-medical: No  Occupational History  . Occupation: Landscape architect  Tobacco Use  . Smoking status: Former Smoker    Packs/day: 0.00    Years: 41.00    Pack years: 0.00    Types: Cigarettes    Last attempt to quit: 03/05/2015    Years since quitting: 2.3  . Smokeless tobacco: Never Used  . Tobacco comment: 04/29/2015 "quit smoking cigarettes 02/27/2015"  Substance and Sexual Activity  . Alcohol use: No  . Drug use: No  . Sexual activity: Not Currently    Birth control/protection: Post-menopausal,  Surgical  Other Topics Concern  . 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      Lives with sister in [place].      Tobacco: 1 ppd x 41 years - quit 2016      Alcohol: none      Drugs: none      Exercise:  Walking in neighborhood; physical job.   Right-handed.   2 cups caffeine daily.    Tobacco Counseling Counseling given: Not Answered Comment: 04/29/2015 "quit smoking cigarettes 02/27/2015"   Clinical Intake:  Pre-visit preparation completed: Yes  Pain : No/denies pain Pain Score: 0-No pain     Nutritional Status: BMI > 30  Obese Nutritional Risks: Nausea/ vomitting/ diarrhea(Patient started with some diarrhea early this morning) Diabetes: Yes(Patient has open wounds on her body but it is being followed by her physician) CBG done?: No Did pt. bring in CBG monitor from home?: No  How often do you need to have someone help you when you read instructions, pamphlets, or other written materials from your doctor or pharmacy?: 1 - Never What is the last grade level you completed in school?: Masters degree  Interpreter Needed?: No  Information entered by :: Andrez Grime, LPN   Activities of Daily Living In your present state of health, do you have any difficulty performing the following activities: 07/26/2017 07/04/2017  Hearing? N N  Vision? N N  Difficulty concentrating or making decisions? N N  Walking or climbing stairs? Y N  Comment has to hold the handrails -  Dressing or bathing? N N  Doing errands, shopping? N N  Preparing Food and eating ? N -  Using the Toilet? N -  In the past six months, have you accidently leaked urine? Y -  Comment patient has urine leakage -  Do you have problems with loss of  bowel control? Y -  Comment patient  has some loss of bowel control -  Managing your Medications? N -  Managing your Finances? N -  Housekeeping or managing your Housekeeping? N -  Some recent data might be hidden     Immunizations and Health Maintenance Immunization History  Administered Date(s) Administered  . Influenza,inj,Quad PF,6+ Mos 04/02/2014, 02/13/2015, 02/05/2016  . Influenza-Unspecified 06/02/2017  . Pneumococcal Conjugate-13 04/02/2014  . Pneumococcal Polysaccharide-23 02/05/2016  . Tdap 11/11/2012   Health Maintenance Due  Topic Date Due  . OPHTHALMOLOGY EXAM  03/12/2017    Patient Care Team: [name], MD as PCP - General (Family Medicine) Iran Planas, MD as Consulting Physician (Orthopedic Surgery) Marygrace Drought, MD as Consulting Physician (Ophthalmology) Renato Shin, MD as Consulting Physician (Endocrinology) Sueanne Margarita, MD as Consulting Physician (Cardiology)  Indicate any recent Medical Services you may have received from other than [facility] providers in the past year (date may be approximate).     Assessment:   This is a routine wellness examination for Melvia.  Hearing/Vision screen Hearing Screening Comments: Patient has had a hearing exam in the past. Vision Screening Comments: Patient sees Dr. Satira Sark for her routine eye exams once a year.  Dietary issues and exercise activities discussed: Current Exercise Habits: The patient does not participate in regular exercise at present, Exercise limited by: None identified  Goals    . Weight (lb) < 157 lb (71.2 kg)     Patient wants to try to lose at least 10% of her body weight.       Depression Screen PHQ 2/9 Scores 07/26/2017 07/22/2017 03/07/2017 02/24/2017 02/10/2017 02/03/2017 01/13/2017  PHQ - 2 Score 0 0 0 0 0 0 0    Fall Risk Fall Risk  07/26/2017 07/22/2017 03/07/2017 02/24/2017 02/10/2017  Falls in the past year? Yes No Yes Yes Yes  Number falls in past yr: 2 or more - 2 or more 2 or more 2 or more  Injury with Fall?  No - Yes Yes No  Comment - - black eyes and lump over right eye - -  Risk for fall due to : History of fall(s) - - - -  Risk for fall due to: Comment tripping and falling  - - - -  Follow up Falls prevention discussed - - - -    Is the patient's home free of loose throw rugs in walkways, pet beds, electrical cords, etc?   yes      Grab bars in the bathroom? yes      Handrails on the stairs?   yes      Adequate lighting?   yes  Timed Get Up and Go Performed: yes, completed within 30 seconds   Cognitive Function: MMSE - Mini Mental State Exam 11/26/2015  Orientation to time 5  Orientation to Place 5  Registration 3  Attention/ Calculation 5  Recall 2  Language- name 2 objects 2  Language- repeat 1  Language- follow 3 step command 3  Language- read & follow direction 1  Write a sentence 1  Copy design 1  Total score 29     6CIT Screen 07/26/2017  What Year? 0 points  What month? 0 points  What time? 3 points  Count back from 20 0 points  Months in reverse 4 points  Repeat phrase 10 points  Total Score 17    Screening Tests Health Maintenance  Topic Date Due  . OPHTHALMOLOGY EXAM  03/12/2017  . HEMOGLOBIN  A1C  01/01/2018  . PAP SMEAR  03/27/2018  . FOOT EXAM  07/07/2018  . URINE MICROALBUMIN  07/23/2018  . MAMMOGRAM  03/15/2019  . PNA vac Low Risk Adult (2 of 2 - PPSV23) 02/04/2021  . COLONOSCOPY  11/05/2021  . TETANUS/TDAP  11/12/2022  . INFLUENZA VACCINE  Completed  . DEXA SCAN  Completed  . Hepatitis C Screening  Completed  . HIV Screening  Completed    Qualifies for Shingles Vaccine? Advised patient to check with her pharmacy about when she can receive the Shingrix vaccine  Cancer Screenings: Lung: Low Dose CT Chest recommended if Age 77-80 years, 30 pack-year currently smoking OR have quit w/in 15years. Patient does not qualify. Breast: Up to date on Mammogram? Yes, completed 03/14/2017 Up to date of Bone Density/Dexa? Yes, completed 11/29/2014 Colorectal:  colonoscopy completed 11/05/2016  Additional Screenings:  Hepatitis B/HIV/Syphillis: HIV completed 08/06/2016, Hep B and Syphillis not indicated Hepatitis C Screening: completed 06/01/2013     Plan:   I have personally reviewed and noted the following in the patient's chart:   . Medical and social history . Use of alcohol, tobacco or illicit drugs  . Current medications and supplements . Functional ability and status . Nutritional status . Physical activity . Advanced directives . List of other physicians . Hospitalizations, surgeries, and ER visits in previous 12 months . Vitals . Screenings to include cognitive, depression, and falls . Referrals and appointments  In addition, I have reviewed and discussed with patient certain preventive protocols, quality metrics, and best practice recommendations. A written personalized care plan for preventive services as well as general preventive health recommendations were provided to patient.   1. Lack of access to adequate food - Ambulatory referral to [facility] Team  2. Encounter for Medicare annual wellness exam   Andrez Grime, LPN   10/27/5091

## 2017-07-28 ENCOUNTER — Ambulatory Visit: Payer: Medicare PPO | Admitting: Podiatry

## 2017-07-28 ENCOUNTER — Encounter: Payer: Self-pay | Admitting: Podiatry

## 2017-07-28 DIAGNOSIS — E1149 Type 2 diabetes mellitus with other diabetic neurological complication: Secondary | ICD-10-CM

## 2017-07-28 DIAGNOSIS — S91302A Unspecified open wound, left foot, initial encounter: Secondary | ICD-10-CM | POA: Diagnosis not present

## 2017-07-28 NOTE — Progress Notes (Signed)
Subjective: Judith [name] presents the office today for follow-up evaluation of a wound to the left big toe.  She feels that it may be a little bit more since today she did notice a small right spot within the central aspect of the area however she has not noticed this before.  She denies any drainage or pus or any redness or swelling to the area.  She has no other concerns today.  She denies any recent injury. Denies any systemic complaints such as fevers, chills, nausea, vomiting. No acute changes since last appointment, and no other complaints at this time.   Objective: AAO x3, NAD DP/PT pulses palpable bilaterally, CRT less than 3 seconds On the distal medial aspect of the left hallux continue to be a skin fissure with evidence of dried blood.  Upon debridement today the area is actually improved in size and measures approximately 0.5 x 0.1 cm and is very superficial.  There is no probing, Or tunneling.  There is no swelling erythema, ascending sialitis.  There is no fluctuation or crepitation.  There is no malodor.  No clinical signs of infection is noted today. No open lesions or pre-ulcerative lesions.  No pain with calf compression, swelling, warmth, erythema  Assessment: Skin fissure, ulceration left hallux without signs of infection  Plan: -All treatment options discussed with the patient including all alternatives, risks, complications.  -After verbal consent was obtained the area was cleaned with alcohol and the wound was sharply debrided with a #312 blade scalpel to remove all hyperkeratotic periwound to healthy, granular tissue.  She tolerated well.  Area was cleaned.  Silvadene was applied followed by a bandage.  Continue with daily dressing changes as well. -Monitor for any clinical signs or symptoms of infection and directed to call the office immediately should any occur or go to the ER. -RTC in 2-3 weeks or sooner if needed. -Patient encouraged to call the office with any questions,  concerns, change in symptoms.   Trula Slade DPM

## 2017-07-30 ENCOUNTER — Encounter: Payer: Self-pay | Admitting: Urgent Care

## 2017-07-30 ENCOUNTER — Ambulatory Visit: Payer: Medicare PPO | Admitting: Urgent Care

## 2017-07-30 ENCOUNTER — Ambulatory Visit (INDEPENDENT_AMBULATORY_CARE_PROVIDER_SITE_OTHER): Payer: Medicare PPO

## 2017-07-30 VITALS — BP 128/80 | HR 69 | Temp 98.4°F | Resp 18 | Ht 64.0 in | Wt 181.0 lb

## 2017-07-30 DIAGNOSIS — J209 Acute bronchitis, unspecified: Secondary | ICD-10-CM | POA: Diagnosis not present

## 2017-07-30 DIAGNOSIS — R059 Cough, unspecified: Secondary | ICD-10-CM

## 2017-07-30 DIAGNOSIS — J4540 Moderate persistent asthma, uncomplicated: Secondary | ICD-10-CM

## 2017-07-30 DIAGNOSIS — R0602 Shortness of breath: Secondary | ICD-10-CM

## 2017-07-30 DIAGNOSIS — R05 Cough: Secondary | ICD-10-CM

## 2017-07-30 DIAGNOSIS — R062 Wheezing: Secondary | ICD-10-CM

## 2017-07-30 MED ORDER — ALBUTEROL SULFATE (2.5 MG/3ML) 0.083% IN NEBU: 2.5000 mg | INHALATION_SOLUTION | Freq: Four times a day (QID) | RESPIRATORY_TRACT | 1 refills | Status: DC | PRN

## 2017-07-30 MED ORDER — BENZONATATE 100 MG PO CAPS: 100.0000 mg | ORAL_CAPSULE | Freq: Three times a day (TID) | ORAL | 0 refills | Status: DC | PRN

## 2017-07-30 MED ORDER — PREDNISONE 20 MG PO TABS: ORAL_TABLET | ORAL | 0 refills | Status: DC

## 2017-07-30 NOTE — Patient Instructions (Addendum)
Please increase your insulin to 30 units at bedtime while you are taking prednisone for your asthma, bronchitis.     Acute Bronchitis, Adult Acute bronchitis is sudden (acute) swelling of the air tubes (bronchi) in the lungs. Acute bronchitis causes these tubes to fill with mucus, which can make it hard to breathe. It can also cause coughing or wheezing. In adults, acute bronchitis usually goes away within 2 weeks. A cough caused by bronchitis may last up to 3 weeks. Smoking, allergies, and asthma can make the condition worse. Repeated episodes of bronchitis may cause further lung problems, such as chronic obstructive pulmonary disease (COPD). What are the causes? This condition can be caused by germs and by substances that irritate the lungs, including:  Cold and flu viruses. This condition is most often caused by the same virus that causes a cold.  Bacteria.  Exposure to tobacco smoke, dust, fumes, and air pollution.  What increases the risk? This condition is more likely to develop in people who:  Have close contact with someone with acute bronchitis.  Are exposed to lung irritants, such as tobacco smoke, dust, fumes, and vapors.  Have a weak immune system.  Have a respiratory condition such as asthma.  What are the signs or symptoms? Symptoms of this condition include:  A cough.  Coughing up clear, yellow, or green mucus.  Wheezing.  Chest congestion.  Shortness of breath.  A fever.  Body aches.  Chills.  A sore throat.  How is this diagnosed? This condition is usually diagnosed with a physical exam. During the exam, your health care provider may order tests, such as chest X-rays, to rule out other conditions. He or she may also:  Test a sample of your mucus for bacterial infection.  Check the level of oxygen in your blood. This is done to check for pneumonia.  Do a chest X-ray or lung function testing to rule out pneumonia and other conditions.  Perform  blood tests.  Your health care provider will also ask about your symptoms and medical history. How is this treated? Most cases of acute bronchitis clear up over time without treatment. Your health care provider may recommend:  Drinking more fluids. Drinking more makes your mucus thinner, which may make it easier to breathe.  Taking a medicine for a fever or cough.  Taking an antibiotic medicine.  Using an inhaler to help improve shortness of breath and to control a cough.  Using a cool mist vaporizer or humidifier to make it easier to breathe.  Follow these instructions at home: Medicines  Take over-the-counter and prescription medicines only as told by your health care provider.  If you were prescribed an antibiotic, take it as told by your health care provider. Do not stop taking the antibiotic even if you start to feel better. General instructions  Get plenty of rest.  Drink enough fluids to keep your urine clear or pale yellow.  Avoid smoking and secondhand smoke. Exposure to cigarette smoke or irritating chemicals will make bronchitis worse. If you smoke and you need help quitting, ask your health care provider. Quitting smoking will help your lungs heal faster.  Use an inhaler, cool mist vaporizer, or humidifier as told by your health care provider.  Keep all follow-up visits as told by your health care provider. This is important. How is this prevented? To lower your risk of getting this condition again:  Wash your hands often with soap and water. If soap and water are not  available, use hand sanitizer.  Avoid contact with people who have cold symptoms.  Try not to touch your hands to your mouth, nose, or eyes.  Make sure to get the flu shot every year.  Contact a health care provider if:  Your symptoms do not improve in 2 weeks of treatment. Get help right away if:  You cough up blood.  You have chest pain.  You have severe shortness of breath.  You  become dehydrated.  You faint or keep feeling like you are going to faint.  You keep vomiting.  You have a severe headache.  Your fever or chills gets worse. This information is not intended to replace advice given to you by your health care provider. Make sure you discuss any questions you have with your health care provider. Document Released: 06/03/2004 Document Revised: 11/19/2015 Document Reviewed: 10/15/2015 Elsevier Interactive Patient Education  2018 [employer].    IF you received an x-ray today, you will receive an invoice from [facility]. Please contact [facility] at [phone] with questions or concerns regarding your invoice.   IF you received labwork today, you will receive an invoice from [facility]. Please contact [facility] at [phone] with questions or concerns regarding your invoice.   Our billing staff will not be able to assist you with questions regarding bills from these companies.  You will be contacted with the lab results as soon as they are available. The fastest way to get your results is to activate your My Chart account. Instructions are located on the last page of this paperwork. If you have not heard from [place] regarding the results in 2 weeks, please contact this office.

## 2017-07-30 NOTE — Progress Notes (Signed)
   MRN: [id number] DOB: 09-Jan-1951  Subjective:   Judith [name] is a 67 y.o. female presenting for 5 day history of shortness of breath, wheezing, chest tightness. Patient has a history of asthma, needs a refill of her nebulized albuterol. Denies chest pain, n/v, abdominal pain, diaphoresis. Quit smoking in 2016.  Judith [name] has a current medication list which includes the following prescription(s): albuterol, atorvastatin, bupropion, clopidogrel, eliquis, fenofibrate, fluoxetine, fluticasone, gabapentin, insulin degludec, insulin pen needle, insulin regular, insulin syringe .5cc/31gx5/16", magnesium oxide, metformin, methimazole, metoprolol succinate, nitroglycerin, pantoprazole, silver sulfadiazine, sotalol, ssd, and tramadol. Also is allergic to dilaudid [hydromorphone hcl]; novolog [insulin aspart]; codeine; iodine; penicillins; propofol; ace inhibitors; demerol [meperidine]; neosporin [neomycin-bacitracin zn-polymyx]; percocet [oxycodone-acetaminophen]; and tape.  Judith [name]  has a past medical history of Abnormal EKG (07/31/2013), Arthritis, Asthma, Carpal tunnel syndrome, bilateral, Chronic kidney disease, Complication of anesthesia, Coronary artery disease, Diabetic peripheral neuropathy ([place]) ("since 1996"), GERD (gastroesophageal reflux disease), Goiter, Headache, History of shingles (06/01/2013), Hyperlipidemia LDL goal <70 (10/13/2015), Hypertension, Hyperthyroidism, PAF (paroxysmal atrial fibrillation) ([place]) (04/29/2015), Pneumonia (~ 1976), Tremors of nervous system, and Type II diabetes mellitus ([place]). 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  Objective:   Vitals: BP 128/80   Pulse 69   Temp 98.4 F (36.9 C) (Oral)   Resp 18   Ht 5\' 4"  (1.626 m)   Wt 181 lb (82.1 kg)   SpO2 95%   BMI 31.07 kg/m   BP Readings from Last 3 Encounters:  07/30/17 128/80  07/26/17 118/72  07/22/17 138/72    Physical Exam  Constitutional: She is oriented to person, place, and time. She appears well-developed and well-nourished.  HENT:  Mouth/Throat: Oropharynx is clear and moist.  Eyes: No scleral icterus.  Cardiovascular: Normal rate, regular rhythm and intact distal pulses. Exam reveals no gallop and no friction rub.  No murmur heard. Pulmonary/Chest: No respiratory distress. She has wheezes (in lower lung fields). She has no rales.  Neurological: She is alert and oriented to person, place, and time.  Skin: Skin is warm and dry.  Psychiatric: She has a normal mood and affect.   Dg Chest 2 View  Result Date: 07/30/2017 CLINICAL DATA:  Cough, chest tightness and shortness of breath. EXAM: CHEST - 2 VIEW COMPARISON:  10/01/2017. FINDINGS: Normal sized heart. Clear lungs. Diffuse peribronchial thickening. Mild scoliosis and mild thoracic spine degenerative changes. IMPRESSION: Moderate bronchitic changes. Electronically Signed   By: Claudie Revering M.D.   On: 07/30/2017 10:07   Assessment and Plan :   Shortness of breath - Plan: DG Chest 2 View  Cough - Plan: DG Chest 2 View  Wheezing  Moderate persistent extrinsic asthma without complication  Acute bronchitis, unspecified organism  Will start prednisone, albuterol. Will have patient increase her Lantus while on prednisone due to her uncontrolled diabetes. Return-to-clinic precautions discussed, patient verbalized understanding. Consider antibiotic course if there is no improvement.  [name], PA-C [facility] [phone] 07/30/2017   9:16 AM

## 2017-08-05 ENCOUNTER — Other Ambulatory Visit: Payer: Self-pay

## 2017-08-05 ENCOUNTER — Encounter: Payer: Self-pay | Admitting: Urgent Care

## 2017-08-05 ENCOUNTER — Ambulatory Visit: Payer: Medicare PPO | Admitting: Urgent Care

## 2017-08-05 ENCOUNTER — Inpatient Hospital Stay (HOSPITAL_COMMUNITY)
Admission: EM | Admit: 2017-08-05 | Discharge: 2017-08-10 | DRG: 308 | Disposition: A | Discharge status: Home / Self Care | Payer: Medicare PPO | Attending: Cardiology | Admitting: Cardiology

## 2017-08-05 ENCOUNTER — Encounter (HOSPITAL_COMMUNITY): Payer: Self-pay | Admitting: *Deleted

## 2017-08-05 ENCOUNTER — Telehealth: Payer: Self-pay | Admitting: Cardiology

## 2017-08-05 VITALS — BP 136/80 | HR 88 | Temp 98.4°F | Ht 64.57 in | Wt 179.0 lb

## 2017-08-05 DIAGNOSIS — K2270 Barrett's esophagus without dysplasia: Secondary | ICD-10-CM | POA: Diagnosis present

## 2017-08-05 DIAGNOSIS — IMO0002 Reserved for concepts with insufficient information to code with codable children: Secondary | ICD-10-CM

## 2017-08-05 DIAGNOSIS — J4540 Moderate persistent asthma, uncomplicated: Secondary | ICD-10-CM | POA: Diagnosis not present

## 2017-08-05 DIAGNOSIS — I480 Paroxysmal atrial fibrillation: Principal | ICD-10-CM | POA: Diagnosis present

## 2017-08-05 DIAGNOSIS — J45909 Unspecified asthma, uncomplicated: Secondary | ICD-10-CM | POA: Diagnosis present

## 2017-08-05 DIAGNOSIS — I25118 Atherosclerotic heart disease of native coronary artery with other forms of angina pectoris: Secondary | ICD-10-CM | POA: Diagnosis not present

## 2017-08-05 DIAGNOSIS — Z794 Long term (current) use of insulin: Secondary | ICD-10-CM

## 2017-08-05 DIAGNOSIS — R05 Cough: Secondary | ICD-10-CM

## 2017-08-05 DIAGNOSIS — K922 Gastrointestinal hemorrhage, unspecified: Secondary | ICD-10-CM

## 2017-08-05 DIAGNOSIS — I10 Essential (primary) hypertension: Secondary | ICD-10-CM | POA: Diagnosis present

## 2017-08-05 DIAGNOSIS — K5791 Diverticulosis of intestine, part unspecified, without perforation or abscess with bleeding: Secondary | ICD-10-CM | POA: Diagnosis not present

## 2017-08-05 DIAGNOSIS — I482 Chronic atrial fibrillation, unspecified: Secondary | ICD-10-CM

## 2017-08-05 DIAGNOSIS — D62 Acute posthemorrhagic anemia: Secondary | ICD-10-CM

## 2017-08-05 DIAGNOSIS — E1122 Type 2 diabetes mellitus with diabetic chronic kidney disease: Secondary | ICD-10-CM | POA: Diagnosis present

## 2017-08-05 DIAGNOSIS — I350 Nonrheumatic aortic (valve) stenosis: Secondary | ICD-10-CM | POA: Diagnosis present

## 2017-08-05 DIAGNOSIS — I4891 Unspecified atrial fibrillation: Secondary | ICD-10-CM | POA: Diagnosis not present

## 2017-08-05 DIAGNOSIS — Z87891 Personal history of nicotine dependence: Secondary | ICD-10-CM

## 2017-08-05 DIAGNOSIS — R0789 Other chest pain: Secondary | ICD-10-CM | POA: Diagnosis not present

## 2017-08-05 DIAGNOSIS — E1151 Type 2 diabetes mellitus with diabetic peripheral angiopathy without gangrene: Secondary | ICD-10-CM

## 2017-08-05 DIAGNOSIS — Z955 Presence of coronary angioplasty implant and graft: Secondary | ICD-10-CM

## 2017-08-05 DIAGNOSIS — F329 Major depressive disorder, single episode, unspecified: Secondary | ICD-10-CM | POA: Diagnosis present

## 2017-08-05 DIAGNOSIS — I472 Ventricular tachycardia, unspecified: Secondary | ICD-10-CM

## 2017-08-05 DIAGNOSIS — E1165 Type 2 diabetes mellitus with hyperglycemia: Secondary | ICD-10-CM

## 2017-08-05 DIAGNOSIS — N181 Chronic kidney disease, stage 1: Secondary | ICD-10-CM | POA: Diagnosis present

## 2017-08-05 DIAGNOSIS — K219 Gastro-esophageal reflux disease without esophagitis: Secondary | ICD-10-CM | POA: Diagnosis present

## 2017-08-05 DIAGNOSIS — Z720 Tobacco use: Secondary | ICD-10-CM | POA: Diagnosis present

## 2017-08-05 DIAGNOSIS — I2510 Atherosclerotic heart disease of native coronary artery without angina pectoris: Secondary | ICD-10-CM

## 2017-08-05 DIAGNOSIS — J209 Acute bronchitis, unspecified: Secondary | ICD-10-CM | POA: Diagnosis not present

## 2017-08-05 DIAGNOSIS — I248 Other forms of acute ischemic heart disease: Secondary | ICD-10-CM | POA: Diagnosis present

## 2017-08-05 DIAGNOSIS — Z7902 Long term (current) use of antithrombotics/antiplatelets: Secondary | ICD-10-CM

## 2017-08-05 DIAGNOSIS — I700 Atherosclerosis of aorta: Secondary | ICD-10-CM | POA: Diagnosis present

## 2017-08-05 DIAGNOSIS — E1142 Type 2 diabetes mellitus with diabetic polyneuropathy: Secondary | ICD-10-CM | POA: Diagnosis present

## 2017-08-05 DIAGNOSIS — Z89511 Acquired absence of right leg below knee: Secondary | ICD-10-CM

## 2017-08-05 DIAGNOSIS — E0590 Thyrotoxicosis, unspecified without thyrotoxic crisis or storm: Secondary | ICD-10-CM | POA: Diagnosis present

## 2017-08-05 DIAGNOSIS — Z7901 Long term (current) use of anticoagulants: Secondary | ICD-10-CM

## 2017-08-05 DIAGNOSIS — E785 Hyperlipidemia, unspecified: Secondary | ICD-10-CM | POA: Diagnosis present

## 2017-08-05 DIAGNOSIS — I129 Hypertensive chronic kidney disease with stage 1 through stage 4 chronic kidney disease, or unspecified chronic kidney disease: Secondary | ICD-10-CM | POA: Diagnosis present

## 2017-08-05 DIAGNOSIS — R059 Cough, unspecified: Secondary | ICD-10-CM

## 2017-08-05 DIAGNOSIS — Z79899 Other long term (current) drug therapy: Secondary | ICD-10-CM

## 2017-08-05 LAB — CBC
HEMATOCRIT: 34.2 % — AB (ref 36.0–46.0)
Hemoglobin: 11.1 g/dL — ABNORMAL LOW (ref 12.0–15.0)
MCH: 29 pg (ref 26.0–34.0)
MCHC: 32.5 g/dL (ref 30.0–36.0)
MCV: 89.3 fL (ref 78.0–100.0)
Platelets: 460 10*3/uL — ABNORMAL HIGH (ref 150–400)
RBC: 3.83 MIL/uL — AB (ref 3.87–5.11)
RDW: 13.6 % (ref 11.5–15.5)
WBC: 10.8 10*3/uL — ABNORMAL HIGH (ref 4.0–10.5)

## 2017-08-05 LAB — BASIC METABOLIC PANEL
ANION GAP: 15 (ref 5–15)
BUN: 22 mg/dL — ABNORMAL HIGH (ref 6–20)
CHLORIDE: 94 mmol/L — AB (ref 101–111)
CO2: 27 mmol/L (ref 22–32)
CREATININE: 0.95 mg/dL (ref 0.44–1.00)
Calcium: 9.6 mg/dL (ref 8.9–10.3)
GFR calc non Af Amer: >60 mL/min (ref >60)
Glucose, Bld: 189 mg/dL — ABNORMAL HIGH (ref 65–99)
Potassium: 3.6 mmol/L (ref 3.5–5.1)
Sodium: 136 mmol/L (ref 135–145)

## 2017-08-05 LAB — PROTIME-INR
INR: 1
PROTHROMBIN TIME: 13.1 s (ref 11.4–15.2)

## 2017-08-05 LAB — I-STAT TROPONIN, ED: Troponin i, poc: 0.01 ng/mL (ref 0.00–0.08)

## 2017-08-05 LAB — APTT: APTT: 28 s (ref 24–36)

## 2017-08-05 LAB — MAGNESIUM: Magnesium: 1.2 mg/dL — ABNORMAL LOW (ref 1.7–2.4)

## 2017-08-05 MED ORDER — AMIODARONE HCL IN DEXTROSE 360-4.14 MG/200ML-% IV SOLN: 30.0000 mg/h | INTRAVENOUS | Status: DC

## 2017-08-05 MED ORDER — MAGNESIUM SULFATE 2 GM/50ML IV SOLN
2.0000 g | Freq: Once | INTRAVENOUS | Status: AC
  Administered 2017-08-05: 2 g via INTRAVENOUS

## 2017-08-05 MED ORDER — AMIODARONE HCL IN DEXTROSE 360-4.14 MG/200ML-% IV SOLN
60.0000 mg/h | INTRAVENOUS | Status: DC
  Administered 2017-08-05: 60 mg/h via INTRAVENOUS

## 2017-08-05 MED ORDER — LORAZEPAM 2 MG/ML IJ SOLN
0.5000 mg | Freq: Once | INTRAMUSCULAR | Status: AC
  Administered 2017-08-05: 0.5 mg via INTRAVENOUS

## 2017-08-05 MED ORDER — LORAZEPAM 2 MG/ML IJ SOLN
INTRAMUSCULAR | Status: AC
  Filled 2017-08-05: qty 1

## 2017-08-05 MED ORDER — AMIODARONE LOAD VIA INFUSION
150.0000 mg | Freq: Once | INTRAVENOUS | Status: AC
  Administered 2017-08-05: 150 mg via INTRAVENOUS
  Filled 2017-08-05: qty 83.34

## 2017-08-05 NOTE — Telephone Encounter (Signed)
ERROR/NO NOTE NEEDED °

## 2017-08-05 NOTE — ED Triage Notes (Signed)
Pt arrived by EMS as a Code STEMI; pt c/o chest pressure at 2015; took one nitro x1 and 324mg  asa. EMS gave another 2 nitros with some improvement. 4mg  zofran and 6mg  morphine given. P had a cardioversion about a month ago. Pt having runs of vtach; A&Ox4

## 2017-08-05 NOTE — Patient Instructions (Addendum)
For sore throat try using a honey-based tea. Use 3 teaspoons of honey with juice squeezed from half lemon. Place shaved pieces of ginger into 1/2-1 cup of water and warm over stove top. Then mix the ingredients and repeat every 4 hours as needed.   Please contact your cardiologist office to see if you can get in prior to May for a recheck. Today, your electrocardiogram looks decent and the same as the last one. But if you experience chest pain again, it might be a medical emergency needing an evaluation in the ER.     Cough, Adult Coughing is a reflex that clears your throat and your airways. Coughing helps to heal and protect your lungs. It is normal to cough occasionally, but a cough that happens with other symptoms or lasts a long time may be a sign of a condition that needs treatment. A cough may last only 2-3 weeks (acute), or it may last longer than 8 weeks (chronic). What are the causes? Coughing is commonly caused by:  Breathing in substances that irritate your lungs.  A viral or bacterial respiratory infection.  Allergies.  Asthma.  Postnasal drip.  Smoking.  Acid backing up from the stomach into the esophagus (gastroesophageal reflux).  Certain medicines.  Chronic lung problems, including COPD (or rarely, lung cancer).  Other medical conditions such as heart failure.  Follow these instructions at home: Pay attention to any changes in your symptoms. Take these actions to help with your discomfort:  Take medicines only as told by your health care provider. ? If you were prescribed an antibiotic medicine, take it as told by your health care provider. Do not stop taking the antibiotic even if you start to feel better. ? Talk with your health care provider before you take a cough suppressant medicine.  Drink enough fluid to keep your urine clear or pale yellow.  If the air is dry, use a cold steam vaporizer or humidifier in your bedroom or your home to help loosen  secretions.  Avoid anything that causes you to cough at work or at home.  If your cough is worse at night, try sleeping in a semi-upright position.  Avoid cigarette smoke. If you smoke, quit smoking. If you need help quitting, ask your health care provider.  Avoid caffeine.  Avoid alcohol.  Rest as needed.  Contact a health care provider if:  You have new symptoms.  You cough up pus.  Your cough does not get better after 2-3 weeks, or your cough gets worse.  You cannot control your cough with suppressant medicines and you are losing sleep.  You develop pain that is getting worse or pain that is not controlled with pain medicines.  You have a fever.  You have unexplained weight loss.  You have night sweats. Get help right away if:  You cough up blood.  You have difficulty breathing.  Your heartbeat is very fast. This information is not intended to replace advice given to you by your health care provider. Make sure you discuss any questions you have with your health care provider. Document Released: 10/23/2010 Document Revised: 10/02/2015 Document Reviewed: 07/03/2014 Elsevier Interactive Patient Education  2018 [place].     Nonspecific Chest Pain Chest pain can be caused by many different conditions. There is always a chance that your pain could be related to something serious, such as a heart attack or a blood clot in your lungs. Chest pain can also be caused by conditions that are  not life-threatening. If you have chest pain, it is very important to follow up with your health care provider. What are the causes? Causes of this condition include:  Heartburn.  Pneumonia or bronchitis.  Anxiety or stress.  Inflammation around your heart (pericarditis) or lung (pleuritis or pleurisy).  A blood clot in your lung.  A collapsed lung (pneumothorax). This can develop suddenly on its own (spontaneous pneumothorax) or from trauma to the chest.  Shingles  infection (varicella-zoster virus).  Heart attack.  Damage to the bones, muscles, and cartilage that make up your chest wall. This can include: ? Bruised bones due to injury. ? Strained muscles or cartilage due to frequent or repeated coughing or overwork. ? Fracture to one or more ribs. ? Sore cartilage due to inflammation (costochondritis).  What increases the risk? Risk factors for this condition may include:  Activities that increase your risk for trauma or injury to your chest.  Respiratory infections or conditions that cause frequent coughing.  Medical conditions or overeating that can cause heartburn.  Heart disease or family history of heart disease.  Conditions or health behaviors that increase your risk of developing a blood clot.  Having had chicken pox (varicella zoster).  What are the signs or symptoms? Chest pain can feel like:  Burning or tingling on the surface of your chest or deep in your chest.  Crushing, pressure, aching, or squeezing pain.  Dull or sharp pain that is worse when you move, cough, or take a deep breath.  Pain that is also felt in your back, neck, shoulder, or arm, or pain that spreads to any of these areas.  Your chest pain may come and go, or it may stay constant. How is this diagnosed? Lab tests or other studies may be needed to find the cause of your pain. Your health care provider may have you take a test called an ECG (electrocardiogram). An ECG records your heartbeat patterns at the time the test is performed. You may also have other tests, such as:  Transthoracic echocardiogram (TTE). In this test, sound waves are used to create a picture of the heart structures and to look at how blood flows through your heart.  Transesophageal echocardiogram (TEE).This is a more advanced imaging test that takes images from inside your body. It allows your health care provider to see your heart in finer detail.  Cardiac monitoring. This allows  your health care provider to monitor your heart rate and rhythm in real time.  Holter monitor. This is a portable device that records your heartbeat and can help to diagnose abnormal heartbeats. It allows your health care provider to track your heart activity for several days, if needed.  Stress tests. These can be done through exercise or by taking medicine that makes your heart beat more quickly.  Blood tests.  Other imaging tests.  How is this treated? Treatment depends on what is causing your chest pain. Treatment may include:  Medicines. These may include: ? Acid blockers for heartburn. ? Anti-inflammatory medicine. ? Pain medicine for inflammatory conditions. ? Antibiotic medicine, if an infection is present. ? Medicines to dissolve blood clots. ? Medicines to treat coronary artery disease (CAD).  Supportive care for conditions that do not require medicines. This may include: ? Resting. ? Applying heat or cold packs to injured areas. ? Limiting activities until pain decreases.  Follow these instructions at home: Medicines  If you were prescribed an antibiotic, take it as told by your health care provider.  Do not stop taking the antibiotic even if you start to feel better.  Take over-the-counter and prescription medicines only as told by your health care provider. Lifestyle  Do not use any products that contain nicotine or tobacco, such as cigarettes and e-cigarettes. If you need help quitting, ask your health care provider.  Do not drink alcohol.  Make lifestyle changes as directed by your health care provider. These may include: ? Getting regular exercise. Ask your health care provider to suggest some activities that are safe for you. ? Eating a heart-healthy diet. A registered dietitian can help you to learn healthy eating options. ? Maintaining a healthy weight. ? Managing diabetes, if necessary. ? Reducing stress, such as with yoga or relaxation  techniques. General instructions  Avoid any activities that bring on chest pain.  If heartburn is the cause for your chest pain, raise (elevate) the head of your bed about 6 inches (15 cm) by putting blocks under the legs. Sleeping with more pillows does not effectively relieve heartburn because it only changes the position of your head.  Keep all follow-up visits as told by your health care provider. This is important. This includes any further testing if your chest pain does not go away. Contact a health care provider if:  Your chest pain does not go away.  You have a rash with blisters on your chest.  You have a fever.  You have chills. Get help right away if:  Your chest pain is worse.  You have a cough that gets worse, or you cough up blood.  You have severe pain in your abdomen.  You have severe weakness.  You faint.  You have sudden, unexplained chest discomfort.  You have sudden, unexplained discomfort in your arms, back, neck, or jaw.  You have shortness of breath at any time.  You suddenly start to sweat, or your skin gets clammy.  You feel nauseous or you vomit.  You suddenly feel light-headed or dizzy.  Your heart begins to beat quickly, or it feels like it is skipping beats. These symptoms may represent a serious problem that is an emergency. Do not wait to see if the symptoms will go away. Get medical help right away. Call your local emergency services (911 in the [place] Do not drive yourself to the hospital. This information is not intended to replace advice given to you by your health care provider. Make sure you discuss any questions you have with your health care provider. Document Released: 02/03/2005 Document Revised: 01/19/2016 Document Reviewed: 01/19/2016 Elsevier Interactive Patient Education  2017 [employer].     IF you received an x-ray today, you will receive an invoice from [facility]. Please contact [facility] at  [phone] with questions or concerns regarding your invoice.   IF you received labwork today, you will receive an invoice from [facility]. Please contact [facility] at [phone] with questions or concerns regarding your invoice.   Our billing staff will not be able to assist you with questions regarding bills from these companies.  You will be contacted with the lab results as soon as they are available. The fastest way to get your results is to activate your My Chart account. Instructions are located on the last page of this paperwork. If you have not heard from [place] regarding the results in 2 weeks, please contact this office.

## 2017-08-05 NOTE — ED Provider Notes (Signed)
[facility] EMERGENCY DEPARTMENT Provider Note   CSN: [id number] Arrival date & time: 08/05/17  2138     History   Chief Complaint Chief Complaint  Patient presents with  . Chest Pain    HPI Reginald Mcgurk is a 67 y.o. female.   Chest Pain   This is a recurrent problem. The current episode started 1 to 2 hours ago. The problem occurs constantly. The problem has been rapidly worsening. The pain is present in the substernal region. The pain is at a severity of 9/10. The pain is severe. The quality of the pain is described as pressure-like. Duration of episode(s) is 2 hours. Associated symptoms include diaphoresis, irregular heartbeat, nausea, near-syncope, palpitations, shortness of breath and vomiting. Pertinent negatives include no abdominal pain, no back pain, no cough and no fever.    Past Medical History:  Diagnosis Date  . Abnormal EKG 07/31/2013  . Arthritis    "hands" (03/06/2015)  . Asthma   . Carpal tunnel syndrome, bilateral   . Chronic kidney disease   . Complication of anesthesia    slow to wake up  . Coronary artery disease     2 v CAD with CTO of the RCA and high grade bifurcational LCx/OM stenosis. S/P PCI DES x 2 to the LCx/OM.  Marland Kitchen Diabetic peripheral neuropathy ([place]) "since 1996"  . GERD (gastroesophageal reflux disease)   . Goiter   . Headache    migraines prior to menopause  . History of shingles 06/01/2013  . Hyperlipidemia LDL goal <70 10/13/2015  . Hypertension   . Hyperthyroidism   . PAF (paroxysmal atrial fibrillation) ([place]) 04/29/2015   CHADS2VASC score of 4 now on Apixaban  . Pneumonia ~ 1976  . Tremors of nervous system   . Type II diabetes mellitus (HCC)    insulin dependent    Patient Active Problem List   Diagnosis Date Noted  . Atrial fibrillation with RVR ([place]) 07/04/2017  . Wide-complex tachycardia ([place]) 07/04/2017  . Type II diabetes mellitus, uncontrolled ([place]) 07/04/2017  . Hypertension 07/04/2017  . Coronary  artery disease 07/04/2017  . Peripheral neuropathy 07/04/2017  . H/O hyperthyroidism 07/04/2017  . HLD (hyperlipidemia) 07/04/2017  . SBO (small bowel obstruction) ([place]) 05/10/2016  . S/P transmetatarsal amputation of foot, right ([place]) 04/19/2016  . Obstructive sleep apnea 11/26/2015  . Bilateral carpal tunnel syndrome 11/26/2015  . Hypomagnesemia 11/16/2015  . Hyperlipidemia LDL goal <70 10/13/2015  . Abnormality of gait 09/02/2015  . Memory loss 08/12/2015  . Diabetic peripheral neuropathy ([place]) 08/12/2015  . Vitamin D deficiency 08/12/2015  . Hyperthyroidism 04/30/2015  . Heme positive stool   . Persistent atrial fibrillation ([place]) 04/29/2015  . Coronary artery disease with stable angina pectoris ([place]) 03/29/2015  . Abnormal nuclear stress test   . Acute kidney injury ([place]) 02/12/2015  . History of goiter 09/28/2014  . GERD (gastroesophageal reflux disease) 07/31/2013  . Depression with anxiety 06/01/2013  . DM2 (diabetes mellitus, type 2) ([place]) 01/30/2013  . HTN (hypertension) 01/30/2013  . Tobacco user 01/30/2013    Past Surgical History:  Procedure Laterality Date  . ABDOMINAL HYSTERECTOMY  1988   age 51; CERVICAL DYSPLASIA; ovaries intact.   . AMPUTATION Right 01/23/2016   Procedure: Right 3rd Ray Amputation;  Surgeon: [name], MD;  Location: [place];  Service: Orthopedics;  Laterality: Right;  . AMPUTATION Right 02/13/2016   Procedure: Right Transmetatarsal Amputation;  Surgeon: [name], MD;  Location: [place];  Service: Orthopedics;  Laterality: Right;  . CARDIAC CATHETERIZATION N/A 02/27/2015   Procedure: Left Heart Cath and Coronary Angiography;  Surgeon: [name], MD; LAD 40%, mCFX 80%, OM 70%, RCA 100% calcified       . CARDIAC CATHETERIZATION N/A 03/06/2015   Procedure: Coronary Stent Intervention;  Surgeon: [name], MD;  Location: [place] [facility];  Service: Cardiovascular;  Laterality: N/A;  Mid CX 3.50x12 promus DES w/ 0% resdual and Prox  OM1 2.50x20 promus DES w/ 20% residual  . CARDIOVERSION    . CARPAL TUNNEL RELEASE Right Nov 2015  . CARPAL TUNNEL RELEASE Right 1992; 05/2014   [name]; [place]  . CESAREAN SECTION  1982; 1984  . FOOT NEUROMA SURGERY Bilateral 2000  . KNEE ARTHROSCOPY Right ~ 2003   "meniscus repair"  . SHOULDER OPEN ROTATOR CUFF REPAIR Right 1996; 1998   "w/fracture repair"  . THYROID SURGERY  2000   "removed lots of nodules"  . TONSILLECTOMY  1976     OB History   None      Home Medications    Prior to Admission medications   Medication Sig Start Date End Date Taking? Authorizing Provider  albuterol (PROVENTIL HFA) 108 (90 Base) MCG/ACT inhaler Inhale 1-2 puffs into the lungs every 6 (six) hours as needed for wheezing or shortness of breath. 11/07/15   [name], MD  albuterol (PROVENTIL) (2.5 MG/3ML) 0.083% nebulizer solution Take 3 mLs (2.5 mg total) by nebulization every 6 (six) hours as needed for wheezing or shortness of breath. 07/30/17   [name], PA-C  atorvastatin (LIPITOR) 20 MG tablet Take 1 tablet (20 mg total) by mouth daily. 04/22/15   Richardson Dopp T, PA-C  benzonatate (TESSALON) 100 MG capsule Take 1-2 capsules (100-200 mg total) by mouth 3 (three) times daily as needed. 07/30/17   [name], PA-C  buPROPion [facility] SR) 150 MG 12 hr tablet TAKE ONE TABLET BY MOUTH TWICE DAILY 03/16/17   [name], PA-C  clopidogrel (PLAVIX) 75 MG tablet Take 1 tablet (75 mg total) by mouth daily. Please make yearly appt with Dr. Radford Pax for May before anymore refills. 1st attempt 05/12/17   Sueanne Margarita, MD  ELIQUIS 5 MG TABS tablet TAKE ONE TABLET BY MOUTH TWICE DAILY 05/12/17   Sueanne Margarita, MD  fenofibrate (TRICOR) 145 MG tablet Take 1 tablet (145 mg total) by mouth daily. 09/27/16 09/22/17  Sueanne Margarita, MD  FLUoxetine (PROZAC) 20 MG capsule TAKE ONE CAPSULE BY MOUTH AT BEDTIME 03/16/17   [name], PA-C  gabapentin (NEURONTIN) 300 MG capsule Take 3 capsules (900  mg total) by mouth 2 (two) times daily. TAKE 3 CAPSULES BY MOUTH EVERY  MORNING AND 3 CAPSULES AT BEDTIME Patient taking differently: Take 900 mg by mouth 2 (two) times daily.  12/29/16   [name], PA-C  insulin degludec (TRESIBA FLEXTOUCH) 100 UNIT/ML SOPN FlexTouch Pen Inject 0.2 mLs (20 Units total) into the skin daily at 10 pm. 07/05/17   Cristal Ford, DO  Insulin Pen Needle 32G X 4 MM MISC Use to inject insulin daily 08/13/16   Renato Shin, MD  insulin regular (NOVOLIN R RELION) 100 units/mL injection 3 times a day (just before each meal), 15-25-10 units 07/07/17   Renato Shin, MD  Insulin Syringe-Needle U-100 (INSULIN SYRINGE .5CC/31GX5/16") 31G X 5/16" 0.5 ML MISC Use to inject insulin 08/13/16   Renato Shin, MD  magnesium oxide (MAG-OX) 400 MG tablet Take 1 tablet (400 mg total) by  mouth daily. 07/07/17   Sherran Needs, NP  metFORMIN (GLUCOPHAGE-XR) 500 MG 24 hr tablet Take 4 tablets (2,000 mg total) daily with breakfast by mouth. Patient taking differently: Take 1,000 mg by mouth 2 (two) times daily.  03/25/17   Renato Shin, MD  methimazole (TAPAZOLE) 10 MG tablet TAKE ONE TABLET BY MOUTH ONCE DAILY 05/11/17   Renato Shin, MD  metoprolol succinate (TOPROL-XL) 25 MG 24 hr tablet TAKE ONE TABLET BY MOUTH TWICE DAILY 12/14/16   Sueanne Margarita, MD  nitroGLYCERIN (NITROSTAT) 0.4 MG SL tablet Place 1 tablet (0.4 mg total) under the tongue every 5 (five) minutes as needed for chest pain. 02/10/17   [name], PA-C  pantoprazole (PROTONIX) 40 MG tablet Take 1 tablet (40 mg total) by mouth daily. 12/29/16   [name], PA-C  predniSONE (DELTASONE) 20 MG tablet Take 2 tablets daily with breakfast. 07/30/17   [name], PA-C  silver sulfADIAZINE (SILVADENE) 1 % cream Apply 1 application topically daily. 07/01/17   Trula Slade, DPM  sotalol (BETAPACE) 80 MG tablet TAKE ONE TABLET BY MOUTH EVERY 12 HOURS 12/14/16   Barrett, Evelene Croon, PA-C  SSD 1 % cream APPLY TO AFFECTED AREA  ONCE DAILY 03/11/17   [name], MD  traMADol (ULTRAM) 50 MG tablet Take 1 tablet (50 mg total) by mouth every 8 (eight) hours as needed. Patient taking differently: Take 50 mg by mouth every 6 (six) hours as needed for moderate pain.  05/13/17   [name], MD    Family History Family History  Problem Relation Age of Onset  . Cancer Mother 4       bronchial cancer  . Breast cancer Mother   . Lung cancer Mother   . Hypertension Father   . COPD Father   . Heart disease Father 86       CAD with cardiac stenting  . Heart attack Father   . Parkinson's disease Father   . Allergies Sister   . Breast cancer Maternal Grandmother   . Emphysema Maternal Grandfather   . Leukemia Paternal Grandmother   . Emphysema Paternal Grandfather   . Thyroid disease Neg Hx     Social History Social History   Tobacco Use  . Smoking status: Former Smoker    Packs/day: 0.00    Years: 41.00    Pack years: 0.00    Types: Cigarettes    Last attempt to quit: 03/05/2015    Years since quitting: 2.4  . Smokeless tobacco: Never Used  . Tobacco comment: 04/29/2015 "quit smoking cigarettes 02/27/2015"  Substance Use Topics  . Alcohol use: No  . Drug use: No     Allergies   Dilaudid [hydromorphone hcl]; Novolog [insulin aspart]; Codeine; Iodine; Penicillins; Propofol; Ace inhibitors; Demerol [meperidine]; Neosporin [neomycin-bacitracin zn-polymyx]; Percocet [oxycodone-acetaminophen]; and Tape   Review of Systems Review of Systems  Constitutional: Positive for diaphoresis. Negative for chills and fever.  HENT: Negative for ear pain and sore throat.   Eyes: Negative for pain and visual disturbance.  Respiratory: Positive for shortness of breath. Negative for cough.   Cardiovascular: Positive for chest pain, palpitations and near-syncope.  Gastrointestinal: Positive for nausea and vomiting. Negative for abdominal pain.  Musculoskeletal: Negative for back pain and neck pain.  Skin: Negative  for color change and rash.  Neurological: Positive for light-headedness. Negative for syncope.  All other systems reviewed and are negative.    Physical Exam Updated Vital Signs BP (!) 151/90  Pulse 73   Temp (!) 96.7 F (35.9 C) (Temporal)   Resp 15   SpO2 97%   Physical Exam  Constitutional: She appears well-developed and well-nourished. She appears ill. She appears distressed.  HENT:  Head: Normocephalic and atraumatic.  Eyes: Conjunctivae are normal.  Neck: Neck supple.  Cardiovascular: Intact distal pulses. An irregularly irregular rhythm present. Tachycardia present.  No murmur heard. Pulses:      Radial pulses are 1+ on the right side, and 1+ on the left side.  Pulmonary/Chest: Effort normal and breath sounds normal. Tachypnea noted. No respiratory distress. She has no decreased breath sounds. She has no wheezes. She has no rales.  Abdominal: Soft. There is no tenderness.  Musculoskeletal:       Right lower leg: She exhibits edema.       Left lower leg: She exhibits edema.  Neurological: She is alert.  Skin: Skin is warm and dry.  Psychiatric: She has a normal mood and affect.  Nursing note and vitals reviewed.    ED Treatments / Results  Labs (all labs ordered are listed, but only abnormal results are displayed) Labs Reviewed  BASIC METABOLIC PANEL - Abnormal; Notable for the following components:      Result Value   Chloride 94 (*)    Glucose, Bld 189 (*)    BUN 22 (*)    All other components within normal limits  CBC - Abnormal; Notable for the following components:   WBC 10.8 (*)    RBC 3.83 (*)    Hemoglobin 11.1 (*)    HCT 34.2 (*)    Platelets 460 (*)    All other components within normal limits  MAGNESIUM - Abnormal; Notable for the following components:   Magnesium 1.2 (*)    All other components within normal limits  APTT  PROTIME-INR  I-STAT TROPONIN, ED  I-STAT CHEM 8, ED    EKG EKG Interpretation  Date/Time:  Friday August 05 2017  21:55:30 EDT Ventricular Rate:  84 PR Interval:    QRS Duration: 84 QT Interval:  394 QTC Calculation: 466 R Axis:   13 Text Interpretation:  Sinus rhythm Short PR interval Anteroseptal infarct, age indeterminate Confirmed by Tanna Furry [phone]) on 08/05/2017 10:31:58 PM   Radiology No results found.  Procedures Procedures (including critical care time)  Medications Ordered in ED Medications  amiodarone (NEXTERONE PREMIX) 360-4.14 MG/200ML-% (1.8 mg/mL) IV infusion (60 mg/hr Intravenous New Bag/Given 08/05/17 2140)    Followed by  amiodarone (NEXTERONE PREMIX) 360-4.14 MG/200ML-% (1.8 mg/mL) IV infusion (has no administration in time range)  magnesium sulfate IVPB 2 g 50 mL (0 g Intravenous Stopped 08/05/17 2243)  amiodarone (NEXTERONE) 1.8 mg/mL load via infusion 150 mg (150 mg Intravenous Bolus from Bag 08/05/17 2141)  LORazepam (ATIVAN) injection 0.5 mg (0.5 mg Intravenous Given 08/05/17 2145)     Initial Impression / Assessment and Plan / ED Course  I have reviewed the triage vital signs and the nursing notes.  Pertinent labs & imaging results that were available during my care of the patient were reviewed by me and considered in my medical decision making (see chart for details).     She is a 67 year old female with history of persistent atrial fibrillation on Eliquis, wide-complex tachycardia, CAD, hyperlipidemia, hypomagnesemia who presents with severe chest pain and palpitations.  EMS routed a EKG that showed A. fib with RVR.  No signs of STEMI.  On patient arrival she went into sustained V. tach but never  lost pulses and patient was mentating the whole time.  She was having severe chest pain with the episodes of V. tach.  She alternated between A. fib with RVR, V. tach, and also a possible torsades rhythm.  Patient placed on cardiac pads but never needed cardioversion or defibrillation.  Patient admittedly given magnesium bolus which decreased the frequency of ventricular  tachycardia.  Patient also started on amiodarone with 150 mg bolus.  Shortly after the amiodarone bolus was finished, patient converted from A. fib with RVR into a sinus rhythm with a heart rate in the 80s.  Patient states her chest pain resolved and feels much better.  We will continue amiodarone infusion at this time.  Magnesium was noted to be 1.2 and this was repleted with 2 g of IV magnesium.  Troponin initially is negative.  Cardiology consulted for A. fib with RVR and nonsustained ventricular tachycardia requiring amiodarone drip and hypomagnesemia.  Cardiology will likely admit.  On reevaluation, patient currently asymptomatic in normal sinus rhythm with heart rate in the 70s.  Final Clinical Impressions(s) / ED Diagnoses   Final diagnoses:  Atrial fibrillation with RVR [facility])  Ventricular tachycardia [facility])    ED Discharge Orders    None       Tobie Poet, DO 08/06/17 0016    Tanna Furry, MD 08/08/17 [phone]

## 2017-08-05 NOTE — H&P (Signed)
Cardiology Admission History and Physical:   Patient ID: Judith [name]; MRN: [id number]; DOB: 02/27/1951   Admission date: 08/05/2017  Primary Care Provider: [name], MD Primary Cardiologist: Radford Pax  Chief Complaint:  AFRVR  Patient Profile:   Judith [name] is a 67 y.o. female with a history of HTN, ASCAD with CTO of the RCA and high grade LCx/OM stenosis s/p DES x 2, hyperlipidemia, Type II DM, hyperthyroidism on methimazole, atrial fibrillations/p multiple DCCV in the past who presented with AFRVR.    History of Present Illness:   Judith [name] is a 67 y.o. female with a history of HTN, ASCAD with CTO of the RCA and high grade LCx/OM stenosis s/p DES x 2, hyperlipidemia, Type II DM, hyperthyroidism on methimazole, atrial fibrillations/p multiple DCCV in the past who presented with AFRVR.  The patient has multiple cardiac comorbidities for which she follows with Dr. Radford Pax. She has AF for which she has required multiple DCCV and is treated with sotalol. She was seen in cardiology clinic on month ago for follow up after hospitalization for AFRVR with WCT thought to be aberrant AF; she was managed with a diltiazem gtt and converted to NSR. At that visit she was doing well without an complaints.   She was brought to the hospital this evening after an episode of CP and palptiations. EMS obtained ECG that showed AFRVR with ST segment deviation concerning for possible STEMI. Code STEMI was called but then cancelled prior to arrival to the ED. In the ED the patient was in [facility] with some wide complex rhythm. She was given IV amiodarone and converted to NSR. Subsequent ECG showed NSR with QTc 466, poor R wave progression, nonspecific ST changes. Labs are notable for K 3.6, Mg 1.2. Troponin negative x1.   On my evaluation, the patient is resting comfortably in bed. She states that she was sitting at home watching TV when she suddenly developed onset of palpitations. She states that this  episode was similar to, but more severe than, her prior episodes of AF. She denies other concomitant chest pain or any associated symptoms including dyspnea. She tried to take an extra half dose of her metoprolol without improvement of her symptoms. She states that her symptoms have completely resolved since being in NSR. She is unaware of what may have triggered this episode and denies mediation noncompliance or alcohol use. She states that she was recently treated for bronchitis and finished a course of prednisone, although she is frequently using her nebulizer.   Past Medical History:  Diagnosis Date  . Abnormal EKG 07/31/2013  . Arthritis    "hands" (03/06/2015)  . Asthma   . Carpal tunnel syndrome, bilateral   . Chronic kidney disease   . Complication of anesthesia    slow to wake up  . Coronary artery disease     2 v CAD with CTO of the RCA and high grade bifurcational LCx/OM stenosis. S/P PCI DES x 2 to the LCx/OM.  Marland Kitchen Diabetic peripheral neuropathy ([place]) "since 1996"  . GERD (gastroesophageal reflux disease)   . Goiter   . Headache    migraines prior to menopause  . History of shingles 06/01/2013  . Hyperlipidemia LDL goal <70 10/13/2015  . Hypertension   . Hyperthyroidism   . PAF (paroxysmal atrial fibrillation) ([place]) 04/29/2015   CHADS2VASC score of 4 now on Apixaban  . Pneumonia ~ 1976  . Tremors of nervous system   . Type II diabetes mellitus ([place])  insulin dependent    Past Surgical History:  Procedure Laterality Date  . ABDOMINAL HYSTERECTOMY  1988   age 50; CERVICAL DYSPLASIA; ovaries intact.   . AMPUTATION Right 01/23/2016   Procedure: Right 3rd Ray Amputation;  Surgeon: [name], MD;  Location: [place];  Service: Orthopedics;  Laterality: Right;  . AMPUTATION Right 02/13/2016   Procedure: Right Transmetatarsal Amputation;  Surgeon: [name], MD;  Location: [place];  Service: Orthopedics;  Laterality: Right;  . CARDIAC CATHETERIZATION N/A 02/27/2015    Procedure: Left Heart Cath and Coronary Angiography;  Surgeon: [name], MD; LAD 40%, mCFX 80%, OM 70%, RCA 100% calcified       . CARDIAC CATHETERIZATION N/A 03/06/2015   Procedure: Coronary Stent Intervention;  Surgeon: [name], MD;  Location: [place] [facility];  Service: Cardiovascular;  Laterality: N/A;  Mid CX 3.50x12 promus DES w/ 0% resdual and Prox OM1 2.50x20 promus DES w/ 20% residual  . CARDIOVERSION    . CARPAL TUNNEL RELEASE Right Nov 2015  . CARPAL TUNNEL RELEASE Right 1992; 05/2014   [name];   . CESAREAN SECTION  1982; 1984  . FOOT NEUROMA SURGERY Bilateral 2000  . KNEE ARTHROSCOPY Right ~ 2003   "meniscus repair"  . SHOULDER OPEN ROTATOR CUFF REPAIR Right 1996; 1998   "w/fracture repair"  . THYROID SURGERY  2000   "removed lots of nodules"  . TONSILLECTOMY  1976     Medications Prior to Admission: Prior to Admission medications   Medication Sig Start Date End Date Taking? Authorizing Provider  albuterol (PROVENTIL HFA) 108 (90 Base) MCG/ACT inhaler Inhale 1-2 puffs into the lungs every 6 (six) hours as needed for wheezing or shortness of breath. 11/07/15   [name], MD  albuterol (PROVENTIL) (2.5 MG/3ML) 0.083% nebulizer solution Take 3 mLs (2.5 mg total) by nebulization every 6 (six) hours as needed for wheezing or shortness of breath. 07/30/17   [name], PA-C  atorvastatin (LIPITOR) 20 MG tablet Take 1 tablet (20 mg total) by mouth daily. 04/22/15   Richardson Dopp T, PA-C  benzonatate (TESSALON) 100 MG capsule Take 1-2 capsules (100-200 mg total) by mouth 3 (three) times daily as needed. 07/30/17   [name], PA-C  buPROPion [facility] SR) 150 MG 12 hr tablet TAKE ONE TABLET BY MOUTH TWICE DAILY 03/16/17   [name], PA-C  clopidogrel (PLAVIX) 75 MG tablet Take 1 tablet (75 mg total) by mouth daily. Please make yearly appt with Dr. Radford Pax for May before anymore refills. 1st attempt 05/12/17   Sueanne Margarita, MD  ELIQUIS 5 MG TABS  tablet TAKE ONE TABLET BY MOUTH TWICE DAILY 05/12/17   Sueanne Margarita, MD  fenofibrate (TRICOR) 145 MG tablet Take 1 tablet (145 mg total) by mouth daily. 09/27/16 09/22/17  Sueanne Margarita, MD  FLUoxetine (PROZAC) 20 MG capsule TAKE ONE CAPSULE BY MOUTH AT BEDTIME 03/16/17   [name], PA-C  gabapentin (NEURONTIN) 300 MG capsule Take 3 capsules (900 mg total) by mouth 2 (two) times daily. TAKE 3 CAPSULES BY MOUTH EVERY  MORNING AND 3 CAPSULES AT BEDTIME Patient taking differently: Take 900 mg by mouth 2 (two) times daily.  12/29/16   [name], PA-C  insulin degludec (TRESIBA FLEXTOUCH) 100 UNIT/ML SOPN FlexTouch Pen Inject 0.2 mLs (20 Units total) into the skin daily at 10 pm. 07/05/17   Cristal Ford, DO  Insulin Pen Needle 32G X 4 MM MISC Use to inject insulin  daily 08/13/16   Renato Shin, MD  insulin regular (NOVOLIN R RELION) 100 units/mL injection 3 times a day (just before each meal), 15-25-10 units 07/07/17   Renato Shin, MD  Insulin Syringe-Needle U-100 (INSULIN SYRINGE .5CC/31GX5/16") 31G X 5/16" 0.5 ML MISC Use to inject insulin 08/13/16   Renato Shin, MD  magnesium oxide (MAG-OX) 400 MG tablet Take 1 tablet (400 mg total) by mouth daily. 07/07/17   Sherran Needs, NP  metFORMIN (GLUCOPHAGE-XR) 500 MG 24 hr tablet Take 4 tablets (2,000 mg total) daily with breakfast by mouth. Patient taking differently: Take 1,000 mg by mouth 2 (two) times daily.  03/25/17   Renato Shin, MD  methimazole (TAPAZOLE) 10 MG tablet TAKE ONE TABLET BY MOUTH ONCE DAILY 05/11/17   Renato Shin, MD  metoprolol succinate (TOPROL-XL) 25 MG 24 hr tablet TAKE ONE TABLET BY MOUTH TWICE DAILY 12/14/16   Sueanne Margarita, MD  nitroGLYCERIN (NITROSTAT) 0.4 MG SL tablet Place 1 tablet (0.4 mg total) under the tongue every 5 (five) minutes as needed for chest pain. 02/10/17   [name], PA-C  pantoprazole (PROTONIX) 40 MG tablet Take 1 tablet (40 mg total) by mouth daily. 12/29/16   [name], PA-C    predniSONE (DELTASONE) 20 MG tablet Take 2 tablets daily with breakfast. 07/30/17   [name], PA-C  silver sulfADIAZINE (SILVADENE) 1 % cream Apply 1 application topically daily. 07/01/17   Trula Slade, DPM  sotalol (BETAPACE) 80 MG tablet TAKE ONE TABLET BY MOUTH EVERY 12 HOURS 12/14/16   Barrett, Evelene Croon, PA-C  SSD 1 % cream APPLY TO AFFECTED AREA ONCE DAILY 03/11/17   [name], MD  traMADol (ULTRAM) 50 MG tablet Take 1 tablet (50 mg total) by mouth every 8 (eight) hours as needed. Patient taking differently: Take 50 mg by mouth every 6 (six) hours as needed for moderate pain.  05/13/17   [name], MD     Allergies:    Allergies  Allergen Reactions  . Dilaudid [Hydromorphone Hcl] Other (See Comments)    HEADACHE  . Novolog [Insulin Aspart] Shortness Of Breath    "breathing problems"  . Codeine Nausea And Vomiting    HIGH DOSES-SEVERE VOMITING  . Iodine Other (See Comments)    MUST HAVE BENADRYL PRIOR TO PROCEDURE AND RIGHT BEFORE TREATMENT TO COUNTERACT REACTION-BLISTERING REACTION DERMATOLOGICAL  . Penicillins Itching, Rash and Other (See Comments)    Has patient had a PCN reaction causing immediate rash, facial/tongue/throat swelling, SOB or lightheadedness with hypotension: Yes Has patient had a PCN reaction causing severe rash involving mucus membranes or skin necrosis: No Has patient had a PCN reaction that required hospitalization No Has patient had a PCN reaction occurring within the last 10 years: No If all of the above answers are "NO", then may proceed with Cephalosporin use.  CHEST SIZED RASH AND ITCHING   . Propofol Other (See Comments)    "Breathing problems - asthma attack" Can take with benadryl   . Ace Inhibitors Cough  . Demerol [Meperidine] Nausea And Vomiting  . Neosporin [Neomycin-Bacitracin Zn-Polymyx] Itching and Rash    MAKES REACTIONS WORSE WHEN USING AS PROPHYLACTIC  . Percocet [Oxycodone-Acetaminophen] Rash  . Tape Itching and Rash     Social History:   Social History   Socioeconomic History  . Marital status: Single    Spouse name: Not on file  . Number of children: 2  . Years of education: Masters  . Highest education level:  Not on file  Occupational History  . Occupation: Landscape architect  Social Needs  . Financial resource strain: Somewhat hard  . Food insecurity:    Worry: Sometimes true    Inability: Sometimes true  . Transportation needs:    Medical: No    Non-medical: No  Tobacco Use  . Smoking status: Former Smoker    Packs/day: 0.00    Years: 41.00    Pack years: 0.00    Types: Cigarettes    Last attempt to quit: 03/05/2015    Years since quitting: 2.4  . Smokeless tobacco: Never Used  . Tobacco comment: 04/29/2015 "quit smoking cigarettes 02/27/2015"  Substance and Sexual Activity  . Alcohol use: No  . Drug use: No  . Sexual activity: Not Currently    Birth control/protection: Post-menopausal, Surgical  Lifestyle  . Physical activity:    Days per week: 0 days    Minutes per session: 0 min  . Stress: Very much  Relationships  . Social connections:    Talks on phone: More than three times a week    Gets together: More than three times a week    Attends religious service: More than 4 times per year    Active member of club or organization: No    Attends meetings of clubs or organizations: Never    Relationship status: Never married  . Intimate partner violence:    Fear of current or ex partner: No    Emotionally abused: No    Physically abused: No    Forced sexual activity: No  Other Topics Concern  . 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      Lives with sister in [place].      Tobacco: 1 ppd x 41 years - quit 2016      Alcohol: none       Drugs: none      Exercise:  Walking in neighborhood; physical job.   Right-handed.   2 cups caffeine daily.    Family History:   The patient's family history includes Allergies in her sister; Breast cancer in her maternal grandmother and mother; COPD in her father; Cancer (age of onset: 61) in her mother; Emphysema in her maternal grandfather and paternal grandfather; Heart attack in her father; Heart disease (age of onset: 65) in her father; Hypertension in her father; Leukemia in her paternal grandmother; Lung cancer in her mother; Parkinson's disease in her father. There is no history of Thyroid disease.    ROS:  Please see the history of present illness.  All other ROS reviewed and negative.     Physical Exam/Data:   Vitals:   08/05/17 2200 08/05/17 2215 08/05/17 2315 08/05/17 2345  BP: 131/82 (!) 146/71 (!) 160/104 (!) 151/90  Pulse: 84 89 76 73  Resp: 13 19 16 15   Temp:      TempSrc:      SpO2: 94% 92% 94% 97%    Intake/Output Summary (Last 24 hours) at 08/06/2017 0043 Last data filed at 08/05/2017 2243 Gross per 24 hour  Intake 50 ml  Output -  Net 50 ml   There were no vitals filed for this visit. There is no height or weight on file to calculate BMI.  General:  Very pleasant, well nourished, well developed, in no acute distress  HEENT: normal Lymph: no adenopathy Neck: no apparent JVD Cardiac:  normal S1, S2; RRR; no murmur  Lungs:  clear to auscultation bilaterally, no wheezing, rhonchi or rales  Abd: soft, nontender, no hepatomegaly  Ext: no edema Musculoskeletal:  No deformities, BUE and BLE strength normal and equal Skin: warm and dry  Neuro:  No focal abnormalities noted Psych:  Normal affect    EKG:  The ECG that was done and was personally reviewed and demonstrates NSR with QTc 466, poor R wave progression, nonspecific ST changes  Relevant CV Studies:   Echo 07/05/2017 Study Conclusions - Left ventricle: The cavity size was normal. There was mild    concentric hypertrophy. Systolic function was normal. The   estimated ejection fraction was in the range of 55% to 60%. Wall   motion was normal; there were no regional wall motion   abnormalities. - Aortic valve: Valve mobility was restricted. There was moderate   stenosis. Valve area (VTI): 1.46 cm^2. Valve area (Vmax): 1.42   cm^2. Valve area (Vmean): 1.42 cm^2. - Mitral valve: Calcified annulus. There was mild regurgitation   directed centrally. - Left atrium: The atrium was moderately dilated.  Echo 02/13/2015 Study Conclusions - Left ventricle: The cavity size was normal. Wall thickness was increased in a pattern of moderate LVH. Systolic function was normal. The estimated ejection fraction was in the range of 60% to 65%. Wall motion was normal; there were no regional wall motion abnormalities. Doppler parameters are consistent with abnormal left ventricular relaxation (grade 1 diastolic dysfunction). - Aortic valve: Valve area (VTI): 2.83 cm^2. Valve area (Vmax): 2.5 cm^2. Valve area (Vmean): 2.94 cm^2. - Mitral valve: Calcified annulus.  Cardiac catheterization 03/06/2015 Successful bifurcation stenting of the left circumflex/obtuse marginal bifurcation using drug-eluting stent platforms Prox LAD to Mid LAD lesion, 40% stenosed. calcified diffuse. Prox RCA lesion, 100% stenosed. calcified diffuse.  Laboratory Data:  Chemistry Recent Labs  Lab 08/05/17 2159  NA 136  K 3.6  CL 94*  CO2 27  GLUCOSE 189*  BUN 22*  CREATININE 0.95  CALCIUM 9.6  GFRNONAA >60  GFRAA >60  ANIONGAP 15    No results for input(s): PROT, ALBUMIN, AST, ALT, ALKPHOS, BILITOT in the last 168 hours. Hematology Recent Labs  Lab 08/05/17 2159  WBC 10.8*  RBC 3.83*  HGB 11.1*  HCT 34.2*  MCV 89.3  MCH 29.0  MCHC 32.5  RDW 13.6  PLT 460*   Cardiac EnzymesNo results for input(s): TROPONINI in the last 168 hours.  Recent Labs  Lab 08/05/17 2142  TROPIPOC 0.01      BNPNo results for input(s): BNP, PROBNP in the last 168 hours.  DDimer No results for input(s): DDIMER in the last 168 hours.  Radiology/Studies:  No results found.  Assessment and Plan:   AFRVR WCT Chest pain The patient has a history of severe paroxysms of AF and had another episode this evening with her typical chest discomfort. Telemetry showed AFRVR with some WCT that appears to be aberrant AF (irregular, similar axis, similar but wider morphology). She is now in NSR after initiation of amiodarone in the ED. She has no further chest pain and ECG shows no acute ischemic changes. The cause of her recurrent episode is not clear but may be due to her recent bronchitis with frequent albuterol use, and her Mg is notably low; she denies medication noncompliance. At this time, will continue home  regimen, although she may benefit from consideration of alternative AF therapy for further control. -Continue to monitor on telemetry -Stopping amiodarone for now unless sx reoccur -Continue home sotalol -Continue home metoprolol with extra dose now -Continue apixaban -Electrolyte repletion  -Checking TSH  ASCAD with CTO of the RCA and high grade LCx/OM stenosis s/p DES x 2 The patient had chest discomfort with her AF episode that is typical of her palpitations. She has no further chest pain, and ECG and initial troponin are reassuring against MI. -Continue to trend troponin -Continue home clopidogrel, atorvastatin  Bronchitis As above, recent treatment for bronchitis may have contributed to this episode -Close monitoring with duonebs  HTN -Continue home metoprolol   HLD -Continue atorvastatin, fibrate  DM  -Continue home long acting insulin with lower dose of mealtime insulin + SSI -Continue GPN for neuropathy -HgA1c ordered  Hyperthyroidism -Continue home methimazole -Checking TSH  Depression -Continue prozac, wellbutrin   Severity of Illness: The appropriate patient status  for this patient is OBSERVATION. Observation status is judged to be reasonable and necessary in order to provide the required intensity of service to ensure the patient's safety. The patient's presenting symptoms, physical exam findings, and initial radiographic and laboratory data in the context of their medical condition is felt to place them at decreased risk for further clinical deterioration. Furthermore, it is anticipated that the patient will be medically stable for discharge from the hospital within 2 midnights of admission. The following factors support the patient status of observation.   " The patient's presenting symptoms include palpitations. " The physical exam findings include n/a. " The initial radiographic and laboratory data are AFRVR, hypoMg.     For questions or updates, please contact [place] Please consult [email or web address] for contact info under Cardiology/STEMI.    Signed, Nila Nephew, MD  08/06/2017 12:43 AM

## 2017-08-05 NOTE — ED Notes (Signed)
Cardiology at bedside.

## 2017-08-05 NOTE — ED Provider Notes (Signed)
Patient seen on arrival with resident, Dr. Nyoka Lint.  Patient arrives with chest pain and lightheadedness.  Multiple rhythms in route and on arrival.  She has tachycardic rhythm.  Wide complex.  Maintains perfusion and level of consciousness with this.  Quick review of her previous chart before arrival shows similar episode that resolved or improved with IV magnesium.  Rhythm strip does show more thick white complex rhythm.  Given 2 g IV magnesium.  Given 150 mg amiodarone and infusion.  After magnesium her ventricular ectopy has resolved.  With amiodarone she rate controlled, think converted to sinus rhythm which she is maintained.  Post conversion EKG shows no ischemia or injury.  Mag is low.  Will give additional supplement.  Care discussed with cardiology.  They will evaluate patient in the emergency room.  CRITICAL CARE Performed by: Lolita Patella   Total critical care time: 45 minutes  Critical care time was exclusive of separately billable procedures and treating other patients.  Critical care was necessary to treat or prevent imminent or life-threatening deterioration.  Critical care was time spent personally by me on the following activities: development of treatment plan with patient and/or surrogate as well as nursing, discussions with consultants, evaluation of patient's response to treatment, examination of patient, obtaining history from patient or surrogate, ordering and performing treatments and interventions, ordering and review of laboratory studies, ordering and review of radiographic studies, pulse oximetry and re-evaluation of patient's condition.    Tanna Furry, MD 08/05/17 2350

## 2017-08-05 NOTE — Progress Notes (Signed)
MRN: [id number] DOB: Jan 28, 1951  Subjective:   Judith [name] is a 67 y.o. female presenting for follow up on bronchitis. At her last OV, she received a short steroid course, Tessalon capsules and refill on nebulized albuterol. She reports some improvement but still has her cough and now has left ear pain. Had 3 episodes of chest pain in the past week, resolved with metoprolol. She did not use nitroglycerin. Denies diaphoresis, n/v, abdominal pain. She is quit smoking 2016, has 40 pack year history. She has an appointment with her cardiologist in May 2019. At her last OV, patient was advised to increase Lantus dose, her blood sugar checks have been in the 130's.   Judith [name] has a current medication list which includes the following prescription(s): albuterol, albuterol, atorvastatin, benzonatate, bupropion, clopidogrel, eliquis, fenofibrate, fluoxetine, fluticasone, gabapentin, insulin degludec, insulin pen needle, insulin regular, insulin syringe .5cc/31gx5/16", magnesium oxide, metformin, methimazole, metoprolol succinate, nitroglycerin, pantoprazole, prednisone, silver sulfadiazine, sotalol, ssd, and tramadol. Also is allergic to dilaudid [hydromorphone hcl]; novolog [insulin aspart]; codeine; iodine; penicillins; propofol; ace inhibitors; demerol [meperidine]; neosporin [neomycin-bacitracin zn-polymyx]; percocet [oxycodone-acetaminophen]; and tape.  Judith [name]  has a past medical history of Abnormal EKG (07/31/2013), Arthritis, Asthma, Carpal tunnel syndrome, bilateral, Chronic kidney disease, Complication of anesthesia, Coronary artery disease, Diabetic peripheral neuropathy ([place]) ("since 1996"), GERD (gastroesophageal reflux disease), Goiter, Headache, History of shingles (06/01/2013), Hyperlipidemia LDL goal <70 (10/13/2015), Hypertension, Hyperthyroidism, PAF (paroxysmal atrial fibrillation) ([place]) (04/29/2015), Pneumonia (~ 1976), Tremors of nervous system, and Type II diabetes mellitus ([place]). 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  Objective:   Vitals: BP 136/80 (BP Location: Left Arm, Patient Position: Sitting, Cuff Size: Normal)   Pulse 88   Temp 98.4 F (36.9 C) (Oral)   Ht 5' 4.57" (1.64 m)   Wt 179 lb (81.2 kg)   SpO2 96%   BMI 30.19 kg/m   Physical Exam  Constitutional: She is oriented to person, place, and time. She appears well-developed and well-nourished.  HENT:  Right Ear: Tympanic membrane normal.  Left Ear: Tympanic membrane normal.  Mouth/Throat: Oropharynx is clear and moist.  Eyes: Right eye exhibits no discharge. Left eye exhibits no discharge.  Cardiovascular: Normal rate, regular rhythm and intact distal pulses. Exam reveals no gallop and no friction rub.  No murmur heard. Soft systolic ejection murmur best heard at LUSB.  Pulmonary/Chest: No respiratory distress. She has no wheezes. She has no rales.  Neurological: She is alert and oriented to person, place, and time.  Skin: Skin is warm and dry.  Psychiatric: She has a normal mood and affect.   ECG interpretation - sinus rhythm at 82bpm without acute changes/findings, very comparable to ecg from 07/07/2017.  Assessment and Plan :   Atypical chest pain - Plan: EKG 12-Lead  Coronary artery disease of native artery of native heart with stable angina pectoris ([place]) - Plan: EKG 12-Lead  Chronic atrial fibrillation ([place])  Uncontrolled diabetes mellitus, with long-term current use of insulin ([facility])  Cough  Acute bronchitis, unspecified  organism  Moderate persistent extrinsic asthma without complication  Will have patient start oral antihistamine to address allergies as a source of her  symptoms. Chest pain is concerning but may be more related to her cough. ECG and physical exam findings reassuring. Patient is to contact her cardiologist to schedule a recheck prior to May given her new chest pain. ER and return-to-clinic precautions discussed, patient verbalized understanding.   Judith Eagles, PA-C [facility] [phone] 08/05/2017  9:40 AM

## 2017-08-06 ENCOUNTER — Other Ambulatory Visit: Payer: Self-pay

## 2017-08-06 DIAGNOSIS — E7800 Pure hypercholesterolemia, unspecified: Secondary | ICD-10-CM

## 2017-08-06 DIAGNOSIS — I350 Nonrheumatic aortic (valve) stenosis: Secondary | ICD-10-CM | POA: Diagnosis not present

## 2017-08-06 DIAGNOSIS — I2510 Atherosclerotic heart disease of native coronary artery without angina pectoris: Secondary | ICD-10-CM | POA: Diagnosis not present

## 2017-08-06 DIAGNOSIS — R748 Abnormal levels of other serum enzymes: Secondary | ICD-10-CM | POA: Diagnosis not present

## 2017-08-06 DIAGNOSIS — E876 Hypokalemia: Secondary | ICD-10-CM | POA: Diagnosis not present

## 2017-08-06 DIAGNOSIS — I10 Essential (primary) hypertension: Secondary | ICD-10-CM

## 2017-08-06 DIAGNOSIS — R079 Chest pain, unspecified: Secondary | ICD-10-CM | POA: Diagnosis not present

## 2017-08-06 DIAGNOSIS — I4891 Unspecified atrial fibrillation: Secondary | ICD-10-CM | POA: Diagnosis not present

## 2017-08-06 DIAGNOSIS — I480 Paroxysmal atrial fibrillation: Secondary | ICD-10-CM | POA: Diagnosis not present

## 2017-08-06 LAB — CBC
HCT: 33.5 % — ABNORMAL LOW (ref 36.0–46.0)
Hemoglobin: 10.7 g/dL — ABNORMAL LOW (ref 12.0–15.0)
MCH: 28.7 pg (ref 26.0–34.0)
MCHC: 31.9 g/dL (ref 30.0–36.0)
MCV: 89.8 fL (ref 78.0–100.0)
Platelets: 399 10*3/uL (ref 150–400)
RBC: 3.73 MIL/uL — ABNORMAL LOW (ref 3.87–5.11)
RDW: 13.5 % (ref 11.5–15.5)
WBC: 10 10*3/uL (ref 4.0–10.5)

## 2017-08-06 LAB — BASIC METABOLIC PANEL
ANION GAP: 12 (ref 5–15)
BUN: 22 mg/dL — ABNORMAL HIGH (ref 6–20)
CHLORIDE: 99 mmol/L — AB (ref 101–111)
CO2: 26 mmol/L (ref 22–32)
Calcium: 9.2 mg/dL (ref 8.9–10.3)
Creatinine, Ser: 0.83 mg/dL (ref 0.44–1.00)
GFR calc non Af Amer: >60 mL/min (ref >60)
Glucose, Bld: 123 mg/dL — ABNORMAL HIGH (ref 65–99)
POTASSIUM: 3.5 mmol/L (ref 3.5–5.1)
Sodium: 137 mmol/L (ref 135–145)

## 2017-08-06 LAB — TROPONIN I
Troponin I: 0.09 ng/mL (ref <0.03)
Troponin I: 0.07 ng/mL (ref <0.03)
Troponin I: 0.06 ng/mL (ref <0.03)

## 2017-08-06 LAB — GLUCOSE, CAPILLARY
GLUCOSE-CAPILLARY: 76 mg/dL (ref 65–99)
GLUCOSE-CAPILLARY: 99 mg/dL (ref 65–99)
GLUCOSE-CAPILLARY: 257 mg/dL — AB (ref 65–99)

## 2017-08-06 LAB — TSH: TSH: 0.977 u[IU]/mL (ref 0.350–4.500)

## 2017-08-06 LAB — HEMOGLOBIN A1C
Hgb A1c MFr Bld: 7.9 % — ABNORMAL HIGH (ref 4.8–5.6)
Mean Plasma Glucose: 180.03 mg/dL

## 2017-08-06 LAB — CBG MONITORING, ED
GLUCOSE-CAPILLARY: 120 mg/dL — AB (ref 65–99)
Glucose-Capillary: 160 mg/dL — ABNORMAL HIGH (ref 65–99)

## 2017-08-06 LAB — MAGNESIUM: Magnesium: 2.2 mg/dL (ref 1.7–2.4)

## 2017-08-06 MED ORDER — SOTALOL HCL 120 MG PO TABS
120.0000 mg | ORAL_TABLET | Freq: Two times a day (BID) | ORAL | Status: DC
Start: 2017-08-06 — End: 2017-08-10
  Administered 2017-08-06 – 2017-08-10 (×8): 120 mg via ORAL
  Filled 2017-08-06 (×10): qty 1

## 2017-08-06 MED ORDER — GABAPENTIN 300 MG PO CAPS
900.0000 mg | ORAL_CAPSULE | Freq: Two times a day (BID) | ORAL | Status: DC
  Administered 2017-08-06 – 2017-08-10 (×9): 900 mg via ORAL
  Filled 2017-08-06 (×9): qty 3

## 2017-08-06 MED ORDER — FENOFIBRATE 54 MG PO TABS
54.0000 mg | ORAL_TABLET | Freq: Every day | ORAL | Status: DC
  Administered 2017-08-06 – 2017-08-10 (×5): 54 mg via ORAL
  Filled 2017-08-06 (×6): qty 1

## 2017-08-06 MED ORDER — BENZONATATE 100 MG PO CAPS
100.0000 mg | ORAL_CAPSULE | Freq: Three times a day (TID) | ORAL | Status: DC | PRN
  Administered 2017-08-06 – 2017-08-09 (×5): 200 mg via ORAL
  Filled 2017-08-06 (×2): qty 2
  Filled 2017-08-06: qty 1
  Filled 2017-08-06 (×2): qty 2

## 2017-08-06 MED ORDER — MAGNESIUM SULFATE 2 GM/50ML IV SOLN
2.0000 g | Freq: Once | INTRAVENOUS | Status: AC
  Administered 2017-08-06: 2 g via INTRAVENOUS
  Filled 2017-08-06: qty 50

## 2017-08-06 MED ORDER — FLUOXETINE HCL 20 MG PO CAPS
20.0000 mg | ORAL_CAPSULE | Freq: Every day | ORAL | Status: DC
  Administered 2017-08-06 – 2017-08-09 (×4): 20 mg via ORAL
  Filled 2017-08-06 (×5): qty 1

## 2017-08-06 MED ORDER — SOTALOL HCL 80 MG PO TABS
80.0000 mg | ORAL_TABLET | Freq: Two times a day (BID) | ORAL | Status: DC
  Filled 2017-08-06: qty 1

## 2017-08-06 MED ORDER — MAGNESIUM OXIDE 400 (241.3 MG) MG PO TABS
400.0000 mg | ORAL_TABLET | Freq: Every day | ORAL | Status: DC
  Administered 2017-08-06 – 2017-08-10 (×5): 400 mg via ORAL
  Filled 2017-08-06 (×6): qty 1

## 2017-08-06 MED ORDER — ATORVASTATIN CALCIUM 20 MG PO TABS
20.0000 mg | ORAL_TABLET | Freq: Every day | ORAL | Status: DC
  Administered 2017-08-06 – 2017-08-09 (×4): 20 mg via ORAL
  Filled 2017-08-06: qty 2
  Filled 2017-08-06 (×4): qty 1

## 2017-08-06 MED ORDER — IPRATROPIUM-ALBUTEROL 0.5-2.5 (3) MG/3ML IN SOLN
3.0000 mL | Freq: Four times a day (QID) | RESPIRATORY_TRACT | Status: DC | PRN
Start: 2017-08-06 — End: 2017-08-10

## 2017-08-06 MED ORDER — BUPROPION HCL ER (SR) 150 MG PO TB12
150.0000 mg | ORAL_TABLET | Freq: Two times a day (BID) | ORAL | Status: DC
  Administered 2017-08-06 – 2017-08-10 (×8): 150 mg via ORAL
  Filled 2017-08-06 (×10): qty 1

## 2017-08-06 MED ORDER — INSULIN ASPART 100 UNIT/ML ~~LOC~~ SOLN
0.0000 [IU] | Freq: Every day | SUBCUTANEOUS | Status: DC
  Administered 2017-08-06: 3 [IU] via SUBCUTANEOUS
  Administered 2017-08-09: 4 [IU] via SUBCUTANEOUS

## 2017-08-06 MED ORDER — AMLODIPINE BESYLATE 5 MG PO TABS
5.0000 mg | ORAL_TABLET | Freq: Every day | ORAL | Status: DC
  Administered 2017-08-06 – 2017-08-07 (×2): 5 mg via ORAL
  Filled 2017-08-06 (×2): qty 1

## 2017-08-06 MED ORDER — METOPROLOL SUCCINATE ER 25 MG PO TB24
25.0000 mg | ORAL_TABLET | Freq: Two times a day (BID) | ORAL | Status: DC
  Administered 2017-08-06 – 2017-08-10 (×9): 25 mg via ORAL
  Filled 2017-08-06 (×10): qty 1

## 2017-08-06 MED ORDER — PANTOPRAZOLE SODIUM 40 MG PO TBEC
40.0000 mg | DELAYED_RELEASE_TABLET | Freq: Every day | ORAL | Status: DC
  Administered 2017-08-06 – 2017-08-10 (×5): 40 mg via ORAL
  Filled 2017-08-06 (×5): qty 1

## 2017-08-06 MED ORDER — NITROGLYCERIN 0.4 MG SL SUBL
0.4000 mg | SUBLINGUAL_TABLET | SUBLINGUAL | Status: DC | PRN
  Administered 2017-08-06: 0.4 mg via SUBLINGUAL
  Filled 2017-08-06 (×2): qty 1

## 2017-08-06 MED ORDER — MAGNESIUM OXIDE 400 (241.3 MG) MG PO TABS
400.0000 mg | ORAL_TABLET | Freq: Once | ORAL | Status: AC
  Administered 2017-08-06: 400 mg via ORAL
  Filled 2017-08-06: qty 1

## 2017-08-06 MED ORDER — POTASSIUM CHLORIDE CRYS ER 20 MEQ PO TBCR: 20.0000 meq | EXTENDED_RELEASE_TABLET | ORAL | Status: DC | PRN

## 2017-08-06 MED ORDER — INSULIN ASPART 100 UNIT/ML ~~LOC~~ SOLN
0.0000 [IU] | Freq: Three times a day (TID) | SUBCUTANEOUS | Status: DC
  Administered 2017-08-07: 5 [IU] via SUBCUTANEOUS
  Administered 2017-08-07: 2 [IU] via SUBCUTANEOUS
  Administered 2017-08-08: 5 [IU] via SUBCUTANEOUS
  Administered 2017-08-08 – 2017-08-09 (×3): 2 [IU] via SUBCUTANEOUS
  Administered 2017-08-09 (×2): 3 [IU] via SUBCUTANEOUS
  Administered 2017-08-10: 1 [IU] via SUBCUTANEOUS
  Filled 2017-08-06 (×2): qty 1

## 2017-08-06 MED ORDER — INSULIN GLARGINE 100 UNIT/ML ~~LOC~~ SOLN
20.0000 [IU] | Freq: Every day | SUBCUTANEOUS | Status: DC
  Administered 2017-08-06 – 2017-08-09 (×4): 20 [IU] via SUBCUTANEOUS
  Filled 2017-08-06 (×5): qty 0.2

## 2017-08-06 MED ORDER — INSULIN ASPART 100 UNIT/ML ~~LOC~~ SOLN
10.0000 [IU] | Freq: Three times a day (TID) | SUBCUTANEOUS | Status: DC
  Administered 2017-08-06 – 2017-08-10 (×8): 10 [IU] via SUBCUTANEOUS
  Filled 2017-08-06 (×2): qty 1

## 2017-08-06 MED ORDER — APIXABAN 5 MG PO TABS
5.0000 mg | ORAL_TABLET | Freq: Two times a day (BID) | ORAL | Status: DC
  Administered 2017-08-06 – 2017-08-07 (×3): 5 mg via ORAL
  Filled 2017-08-06 (×4): qty 1

## 2017-08-06 MED ORDER — CLOPIDOGREL BISULFATE 75 MG PO TABS
75.0000 mg | ORAL_TABLET | Freq: Every day | ORAL | Status: DC
  Administered 2017-08-06 – 2017-08-07 (×2): 75 mg via ORAL
  Filled 2017-08-06 (×3): qty 1

## 2017-08-06 MED ORDER — METHIMAZOLE 10 MG PO TABS
10.0000 mg | ORAL_TABLET | Freq: Every day | ORAL | Status: DC
  Administered 2017-08-06 – 2017-08-10 (×5): 10 mg via ORAL
  Filled 2017-08-06 (×6): qty 1

## 2017-08-06 NOTE — Progress Notes (Signed)
Pt called out with chest pain, pt reports coming from bathroom and felt sob with tightness upper chest to throat " this is like what I have had before" vss, sr on telemetry, ekg done and ntg x1 SL,  02 2l/[place] applied paged cardiologist on call, pt reports feeling better ,

## 2017-08-06 NOTE — ED Notes (Signed)
Heart healthy meal tray ordered for pt at this time 

## 2017-08-06 NOTE — ED Notes (Signed)
Verified discontinuation of amiodarone with Dr.Bazemore; pt to receive additional dose of metoprolol; message sent to pharmacy to metoprolol

## 2017-08-06 NOTE — ED Notes (Signed)
Heart healthy breakfast tray ordered 

## 2017-08-06 NOTE — ED Notes (Signed)
Received some medications from pharmacy; waiting for metoprolol

## 2017-08-06 NOTE — Progress Notes (Signed)
Progress Note  Patient Name: Judith [name] Date of Encounter: 08/06/2017  Primary Cardiologist: No primary care provider on file.   Subjective   Denies any CP.  Now in NSR  Inpatient Medications    Scheduled Meds: . apixaban  5 mg Oral BID  . atorvastatin  20 mg Oral Daily  . buPROPion  150 mg Oral BID WC  . clopidogrel  75 mg Oral Daily  . fenofibrate  54 mg Oral Daily  . FLUoxetine  20 mg Oral QHS  . gabapentin  900 mg Oral BID  . insulin aspart  0-15 Units Subcutaneous TID WC  . insulin aspart  0-5 Units Subcutaneous QHS  . insulin aspart  10 Units Subcutaneous TID WC  . insulin glargine  20 Units Subcutaneous Q2200  . magnesium oxide  400 mg Oral Daily  . methimazole  10 mg Oral Daily  . metoprolol succinate  25 mg Oral BID  . pantoprazole  40 mg Oral Daily  . sotalol  80 mg Oral Q12H   Continuous Infusions:  PRN Meds: benzonatate, ipratropium-albuterol, nitroGLYCERIN, potassium chloride   Vital Signs    Vitals:   08/06/17 0645 08/06/17 0900 08/06/17 1000 08/06/17 1100  BP: (!) 187/100 (!) 173/92 (!) 158/65 (!) 149/79  Pulse: 72 67 65 64  Resp: 15 13 19 13   Temp:      TempSrc:      SpO2: 97% 99% 99% 97%    Intake/Output Summary (Last 24 hours) at 08/06/2017 1147 Last data filed at 08/05/2017 2243 Gross per 24 hour  Intake 50 ml  Output -  Net 50 ml   There were no vitals filed for this visit.  Telemetry    NSR - Personally Reviewed  ECG    No new EKG to review - Personally Reviewed  Physical Exam   GEN: No acute distress.   Neck: No JVD Cardiac: RRR, no murmurs, rubs, or gallops.  Respiratory: Clear to auscultation bilaterally. GI: Soft, nontender, non-distended  MS: No edema; No deformity. Neuro:  Nonfocal  Psych: Normal affect   Labs    Chemistry Recent Labs  Lab 08/05/17 2159 08/06/17 0149  NA 136 137  K 3.6 3.5  CL 94* 99*  CO2 27 26  GLUCOSE 189* 123*  BUN 22* 22*  CREATININE 0.95 0.83  CALCIUM 9.6 9.2  GFRNONAA >60  >60  GFRAA >60 >60  ANIONGAP 15 12     Hematology Recent Labs  Lab 08/05/17 2159 08/06/17 0149  WBC 10.8* 10.0  RBC 3.83* 3.73*  HGB 11.1* 10.7*  HCT 34.2* 33.5*  MCV 89.3 89.8  MCH 29.0 28.7  MCHC 32.5 31.9  RDW 13.6 13.5  PLT 460* 399    Cardiac Enzymes Recent Labs  Lab 08/06/17 0149 08/06/17 0909  TROPONINI 0.09* 0.07*    Recent Labs  Lab 08/05/17 2142  TROPIPOC 0.01     BNPNo results for input(s): BNP, PROBNP in the last 168 hours.   DDimer No results for input(s): DDIMER in the last 168 hours.   Radiology    No results found.  Cardiac Studies   2D echo 07/05/2017 Study Conclusions  - Left ventricle: The cavity size was normal. There was mild   concentric hypertrophy. Systolic function was normal. The   estimated ejection fraction was in the range of 55% to 60%. Wall   motion was normal; there were no regional wall motion   abnormalities. - Aortic valve: Valve mobility was restricted. There was moderate  stenosis. Valve area (VTI): 1.46 cm^2. Valve area (Vmax): 1.42   cm^2. Valve area (Vmean): 1.42 cm^2. - Mitral valve: Calcified annulus. There was mild regurgitation   directed centrally. - Left atrium: The atrium was moderately dilated.  Patient Profile     67 y.o. female with a history of HTN, ASCAD with CTO of the RCA and high grade LCx/OM stenosis s/p DES x 2, hyperlipidemia, Type II DM, hyperthyroidism on methimazole, atrial fibrillations/p multiple DCCV in the past who presented with AFRVR and chest pain.  .    Assessment & Plan    1.  Atrial fibrillation with rapid ventricular response --She has a history of multiple cardioversions in the past and has been maintained in sinus rhythm on sotalol --Developed sudden onset of rapid heart palpitations last night and then developed chest discomfort and in the ER was in A. fib with RVR --Converted to sinus rhythm after an IV bolus of amiodarone --Magnesium was low at 1.2 potassium was  3.6 --She was recently treated for bronchitis with a course of prednisone and had been using her nebulizer frequently which may have triggered the episode of A. Fib --TSH is normal --EKG last night showed normal sinus rhythm with a stable QTC at 466 ms --I will increase her sotalol to 120 mg twice daily --Keep overnight to follow QTC --Continue to expand 5 mg twice daily --Recommend outpatient sleep study given her morbid obesity and recurrent atrial fibrillation --We will also get her set up in A. fib clinic as an outpatient  2.  Chest pain with minimally elevated flat troponin increase --This is likely related to demand ischemia in the setting of atrial fibrillation with RVR and also underlying moderate aortic stenosis --EKG did show some ventricular aberration during A. fib with RVR but baseline EKG in normal sinus rhythm shows no ST abnormality --She denies any history of exertional chest pain and only has chest pain when she gets A. Fib --2D echocardiogram 07/05/2017 showed normal LV function-I will repeat a limited echo for wall motion to make sure that there are no new focal wall motion abnormalities.  3.  ASCAD -patient has known CTO of the RCA and high-grade left circumflex/OM stenosis status post DES x2 and residual 40% stenosis of the LAD. --I do not think minimal troponin bump is consistent with ACS and is likely demand ischemia in the setting of rapid atrial fibrillation --She has not had any exertional angina at home. --Continue atorvastatin 20 mg daily, Plavix 75 mg daily and Toprol-XL 25 mg twice daily.  She is not on aspirin due to her apixaban. --If LV function is normal on 2D echocardiogram would recommend outpatient nuclear stress test  4.  Hyperlipidemia with LDL goal less than 70 --Continue atorvastatin and fenofibrate --FLP and ALT in a.m.  5.  Hypertension --Blood pressure is poorly controlled on exam today. --Continue Toprol-XL 25 mg twice daily --Add amlodipine 5  mg daily --He has been intolerant to ACE inhibitors in the past due to cough  6.  Type 2 diabetes mellitus --Continue home long-acting insulin with lower dose of mealtime insulin plus SSI --Hemoglobin A1c is 7.9 --She needs close follow-up with outpatient PCP  7.  Hypomagnesemia --This is been repleted and repeat magnesium is 2.2 --Need to keep magnesium greater than 2 and potassium greater than 4  8.  Moderate aortic stenosis noted on 2D echocardiogram 07/05/2017 --Will need to follow with yearly echo  For questions or updates, please contact [place] Please consult  [email or web address] for contact info under Cardiology/STEMI.      Signed, Fransico Him, MD  08/06/2017, 11:47 AM

## 2017-08-06 NOTE — ED Notes (Signed)
Pt desatting to 85% while sleeping, placed on 2L [place]

## 2017-08-06 NOTE — Progress Notes (Signed)
No new orders per cardiology at this time. Patient reports no chest pain, only mild SOB. Will keep patient on 2 l/min for comfort.  Will continue to monitor.  Emonii Wienke, RN

## 2017-08-07 ENCOUNTER — Observation Stay (HOSPITAL_COMMUNITY): Payer: Medicare PPO

## 2017-08-07 DIAGNOSIS — I350 Nonrheumatic aortic (valve) stenosis: Secondary | ICD-10-CM | POA: Diagnosis not present

## 2017-08-07 DIAGNOSIS — E876 Hypokalemia: Secondary | ICD-10-CM | POA: Diagnosis not present

## 2017-08-07 DIAGNOSIS — I480 Paroxysmal atrial fibrillation: Principal | ICD-10-CM

## 2017-08-07 DIAGNOSIS — R1012 Left upper quadrant pain: Secondary | ICD-10-CM

## 2017-08-07 DIAGNOSIS — K625 Hemorrhage of anus and rectum: Secondary | ICD-10-CM | POA: Diagnosis not present

## 2017-08-07 DIAGNOSIS — I10 Essential (primary) hypertension: Secondary | ICD-10-CM | POA: Diagnosis not present

## 2017-08-07 LAB — CBC
HCT: 30.4 % — ABNORMAL LOW (ref 36.0–46.0)
Hemoglobin: 9.6 g/dL — ABNORMAL LOW (ref 12.0–15.0)
MCH: 28.3 pg (ref 26.0–34.0)
MCHC: 31.6 g/dL (ref 30.0–36.0)
MCV: 89.7 fL (ref 78.0–100.0)
PLATELETS: 384 10*3/uL (ref 150–400)
RBC: 3.39 MIL/uL — AB (ref 3.87–5.11)
RDW: 13.5 % (ref 11.5–15.5)
WBC: 8.6 10*3/uL (ref 4.0–10.5)

## 2017-08-07 LAB — GLUCOSE, CAPILLARY
GLUCOSE-CAPILLARY: 211 mg/dL — AB (ref 65–99)
GLUCOSE-CAPILLARY: 147 mg/dL — AB (ref 65–99)
GLUCOSE-CAPILLARY: 187 mg/dL — AB (ref 65–99)
Glucose-Capillary: 116 mg/dL — ABNORMAL HIGH (ref 65–99)

## 2017-08-07 MED ORDER — POTASSIUM CHLORIDE CRYS ER 20 MEQ PO TBCR
40.0000 meq | EXTENDED_RELEASE_TABLET | Freq: Once | ORAL | Status: AC
  Administered 2017-08-07: 40 meq via ORAL
  Filled 2017-08-07: qty 2

## 2017-08-07 MED ORDER — AMLODIPINE BESYLATE 5 MG PO TABS
5.0000 mg | ORAL_TABLET | Freq: Once | ORAL | Status: AC
  Administered 2017-08-07: 5 mg via ORAL
  Filled 2017-08-07: qty 1

## 2017-08-07 MED ORDER — AMLODIPINE BESYLATE 10 MG PO TABS
10.0000 mg | ORAL_TABLET | Freq: Every day | ORAL | Status: DC
  Administered 2017-08-08 – 2017-08-10 (×3): 10 mg via ORAL
  Filled 2017-08-07 (×3): qty 1

## 2017-08-07 MED ORDER — SODIUM CHLORIDE 0.9 % IV SOLN
Freq: Once | INTRAVENOUS | Status: AC
  Administered 2017-08-07: 23:00:00 via INTRAVENOUS

## 2017-08-07 MED ORDER — BARIUM SULFATE 2.1 % PO SUSP
ORAL | Status: AC
  Administered 2017-08-07: 19:00:00
  Filled 2017-08-07: qty 2

## 2017-08-07 NOTE — Progress Notes (Signed)
Paged and talked to Dr Henrene Pastor regarding 3x rectal bleeding with clots noted. Made aware of latest hemoglobin and vital signs.stated . To page Primary MD. About the patients condition.

## 2017-08-07 NOTE — Consult Note (Signed)
[facility] Referring Provider: Fransico Him, MD Primary Care Physician:  [name], MD Primary Gastroenterologist:  Dr. Silverio Decamp  Reason for Consultation: GI bleed   HPI:  Judith [name] is a 67 y.o. female with CAD, chronic Afib, admitted  2 days ago with AFRVR.  She is on both plavix and eliquis.   While in hospital she converted to NSR after IV amiodarone.  She did have a small troponin bump which is felt probably due to demand ischemia related to the RVR rather than an ACS.  Plans were being made for a 2D echo tomorrow to assess for wall motion abnormalities however this afternoon she passed red blood and blood clot rectally.  SHe had no associated CP or SOB.  Since then she has had 3 more bright red bloody movements (one was still in toilet at time of my interview, clearly fresh red blood). SHe has never had bleeding like this before. She does have some mild cramping in her lower abdomen 1-2 hours before the bleeding started.    10/2016 she undrwent colonoscopy and EGD by Dr. Silverio Decamp to workup heme + stools, IDA.  The colonoscopy showed "severe" pan diverticulosis changes and a small, subCM tubular adenoma was removed. Small internal hemorrhoids were noted as well.  She was recommended to have surveillance colonoscopy in 5 years.  EGD that same day showed candida infection of the esophagus and a small hiatal hernia. She was put on diflucan.  We also have records of 12/2008 [facility] colonoscopy "there was a small amount of feculent material in the proximal ascending colon. There is moderate pandiverticulosis no endoscopic evidence of diverticulitis. In the mid sigmoid there were 2 small likely hyperplastic polyps in close proximity these were removed with cold biopsy forceps she was advised to have surveillance in 5 years."  Also 11/2008 [place] EGD : "She was noted to have short segment Barrett's esophagus and nodular fundal gastritis. Pathology showed squamous mucosa with mild  reactive epithelial changes of the type that may be seen with gastroesophageal reflux. Mild active chronic inflammation of cardiac-type gastric mucosa. No intestinal metaplasia is seen. Negative for dysplasia. She was advised to have surveillance in 2 years."        Past Medical History:  Diagnosis Date  . Abnormal EKG 07/31/2013  . Arthritis    "hands" (03/06/2015)  . Asthma   . Carpal tunnel syndrome, bilateral   . Chronic kidney disease   . Complication of anesthesia    slow to wake up  . Coronary artery disease     2 v CAD with CTO of the RCA and high grade bifurcational LCx/OM stenosis. S/P PCI DES x 2 to the LCx/OM.  Marland Kitchen Diabetic peripheral neuropathy ([place]) "since 1996"  . GERD (gastroesophageal reflux disease)   . Goiter   . Headache    migraines prior to menopause  . History of shingles 06/01/2013  . Hyperlipidemia LDL goal <70 10/13/2015  . Hypertension   . Hyperthyroidism   . PAF (paroxysmal atrial fibrillation) ([place]) 04/29/2015   CHADS2VASC score of 4 now on Apixaban  . Pneumonia ~ 1976  . Tremors of nervous system   . Type II diabetes mellitus (HCC)    insulin dependent    Past Surgical History:  Procedure Laterality Date  . ABDOMINAL HYSTERECTOMY  1988   age 73; CERVICAL DYSPLASIA; ovaries intact.   . AMPUTATION Right 01/23/2016   Procedure: Right 3rd Ray Amputation;  Surgeon: [name], MD;  Location: [place];  Service: Orthopedics;  Laterality: Right;  . AMPUTATION Right 02/13/2016   Procedure: Right Transmetatarsal Amputation;  Surgeon: [name], MD;  Location: [place];  Service: Orthopedics;  Laterality: Right;  . CARDIAC CATHETERIZATION N/A 02/27/2015   Procedure: Left Heart Cath and Coronary Angiography;  Surgeon: [name], MD; LAD 40%, mCFX 80%, OM 70%, RCA 100% calcified       . CARDIAC CATHETERIZATION N/A 03/06/2015   Procedure: Coronary Stent Intervention;  Surgeon: [name], MD;  Location: [place] [facility];  Service: Cardiovascular;   Laterality: N/A;  Mid CX 3.50x12 promus DES w/ 0% resdual and Prox OM1 2.50x20 promus DES w/ 20% residual  . CARDIOVERSION    . CARPAL TUNNEL RELEASE Right Nov 2015  . CARPAL TUNNEL RELEASE Right 1992; 05/2014   [name]; [place]  . CESAREAN SECTION  1982; 1984  . FOOT NEUROMA SURGERY Bilateral 2000  . KNEE ARTHROSCOPY Right ~ 2003   "meniscus repair"  . SHOULDER OPEN ROTATOR CUFF REPAIR Right 1996; 1998   "w/fracture repair"  . THYROID SURGERY  2000   "removed lots of nodules"  . TONSILLECTOMY  1976    Prior to Admission medications   Medication Sig Start Date End Date Taking? Authorizing Provider  albuterol (PROVENTIL HFA) 108 (90 Base) MCG/ACT inhaler Inhale 1-2 puffs into the lungs every 6 (six) hours as needed for wheezing or shortness of breath. 11/07/15  Yes [name], MD  albuterol (PROVENTIL) (2.5 MG/3ML) 0.083% nebulizer solution Take 3 mLs (2.5 mg total) by nebulization every 6 (six) hours as needed for wheezing or shortness of breath. 07/30/17  Yes [name], PA-C  atorvastatin (LIPITOR) 20 MG tablet Take 1 tablet (20 mg total) by mouth daily. 04/22/15  Yes Weaver, Scott T, PA-C  benzonatate (TESSALON) 100 MG capsule Take 1-2 capsules (100-200 mg total) by mouth 3 (three) times daily as needed. Patient taking differently: Take 100-200 mg by mouth 3 (three) times daily as needed for cough.  07/30/17  Yes [name], PA-C  buPROPion [facility] SR) 150 MG 12 hr tablet TAKE ONE TABLET BY MOUTH TWICE DAILY 03/16/17  Yes [name], PA-C  clopidogrel (PLAVIX) 75 MG tablet Take 1 tablet (75 mg total) by mouth daily. Please make yearly appt with Dr. Radford Pax for May before anymore refills. 1st attempt 05/12/17  Yes Turner, Eber Hong, MD  ELIQUIS 5 MG TABS tablet TAKE ONE TABLET BY MOUTH TWICE DAILY 05/12/17  Yes Turner, Eber Hong, MD  fenofibrate (TRICOR) 145 MG tablet Take 1 tablet (145 mg total) by mouth daily. 09/27/16 09/22/17 Yes Turner, Eber Hong, MD  FLUoxetine (PROZAC) 20 MG  capsule TAKE ONE CAPSULE BY MOUTH AT BEDTIME 03/16/17  Yes [name], PA-C  gabapentin (NEURONTIN) 300 MG capsule Take 3 capsules (900 mg total) by mouth 2 (two) times daily. TAKE 3 CAPSULES BY MOUTH EVERY  MORNING AND 3 CAPSULES AT BEDTIME Patient taking differently: Take 900 mg by mouth 2 (two) times daily.  12/29/16  Yes [name], PA-C  insulin degludec (TRESIBA FLEXTOUCH) 100 UNIT/ML SOPN FlexTouch Pen Inject 0.2 mLs (20 Units total) into the skin daily at 10 pm. 07/05/17  Yes Mikhail, Velta Addison, DO  Insulin Pen Needle 32G X 4 MM MISC Use to inject insulin daily 08/13/16  Yes Renato Shin, MD  insulin regular (NOVOLIN R RELION) 100 units/mL injection 3 times a day (just before each meal), 15-25-10 units Patient taking differently: Inject 10-20 Units into the skin 3 (three) times  daily. 3 times a day (just before each meal), 15-20-10 units 07/07/17  Yes Renato Shin, MD  Insulin Syringe-Needle U-100 (INSULIN SYRINGE .5CC/31GX5/16") 31G X 5/16" 0.5 ML MISC Use to inject insulin 08/13/16  Yes Renato Shin, MD  metFORMIN (GLUCOPHAGE-XR) 500 MG 24 hr tablet Take 4 tablets (2,000 mg total) daily with breakfast by mouth. Patient taking differently: Take 1,000 mg by mouth 2 (two) times daily.  03/25/17  Yes Renato Shin, MD  methimazole (TAPAZOLE) 10 MG tablet TAKE ONE TABLET BY MOUTH ONCE DAILY 05/11/17  Yes Renato Shin, MD  metoprolol succinate (TOPROL-XL) 25 MG 24 hr tablet TAKE ONE TABLET BY MOUTH TWICE DAILY 12/14/16  Yes Turner, Eber Hong, MD  nitroGLYCERIN (NITROSTAT) 0.4 MG SL tablet Place 1 tablet (0.4 mg total) under the tongue every 5 (five) minutes as needed for chest pain. 02/10/17  Yes [name], PA-C  pantoprazole (PROTONIX) 40 MG tablet Take 1 tablet (40 mg total) by mouth daily. 12/29/16  Yes [name], PA-C  silver sulfADIAZINE (SILVADENE) 1 % cream Apply 1 application topically daily. 07/01/17  Yes Trula Slade, DPM  sotalol (BETAPACE) 80 MG tablet TAKE ONE TABLET BY  MOUTH EVERY 12 HOURS 12/14/16  Yes Barrett, Rhonda G, PA-C  SSD 1 % cream APPLY TO AFFECTED AREA ONCE DAILY 03/11/17  Yes [name], MD  traMADol (ULTRAM) 50 MG tablet Take 1 tablet (50 mg total) by mouth every 8 (eight) hours as needed. Patient taking differently: Take 50 mg by mouth every 6 (six) hours as needed for moderate pain.  05/13/17  Yes [name], MD    Current Facility-Administered Medications  Medication Dose Route Frequency Provider Last Rate Last Dose  . [START ON 08/08/2017] amLODipine (NORVASC) tablet 10 mg  10 mg Oral Daily Turner, Traci R, MD      . atorvastatin (LIPITOR) tablet 20 mg  20 mg Oral Daily Nila Nephew, MD   20 mg at 08/07/17 0831  . benzonatate (TESSALON) capsule 100-200 mg  100-200 mg Oral TID PRN Nila Nephew, MD   200 mg at 08/07/17 0825  . buPROPion (WELLBUTRIN SR) 12 hr tablet 150 mg  150 mg Oral BID WC Nila Nephew, MD   150 mg at 08/07/17 0826  . clopidogrel (PLAVIX) tablet 75 mg  75 mg Oral Daily Nila Nephew, MD   75 mg at 08/07/17 0827  . fenofibrate tablet 54 mg  54 mg Oral Daily Nila Nephew, MD   54 mg at 08/07/17 0827  . FLUoxetine (PROZAC) capsule 20 mg  20 mg Oral QHS Nila Nephew, MD   20 mg at 08/06/17 2157  . gabapentin (NEURONTIN) capsule 900 mg  900 mg Oral BID Nila Nephew, MD   900 mg at 08/07/17 0830  . insulin aspart (novoLOG) injection 0-15 Units  0-15 Units Subcutaneous TID WC Nila Nephew, MD   2 Units at 08/07/17 1253  . insulin aspart (novoLOG) injection 0-5 Units  0-5 Units Subcutaneous QHS Nila Nephew, MD   3 Units at 08/06/17 2201  . insulin aspart (novoLOG) injection 10 Units  10 Units Subcutaneous TID WC Nila Nephew, MD   10 Units at 08/07/17 1254  . insulin glargine (LANTUS) injection 20 Units  20 Units Subcutaneous Q2200 Nila Nephew, MD   20 Units at 08/06/17 2226  . ipratropium-albuterol (DUONEB) 0.5-2.5 (3) MG/3ML nebulizer solution 3 mL  3 mL Nebulization Q6H PRN Nila Nephew, MD      . magnesium oxide (MAG-OX) tablet 400  mg  400 mg Oral Daily Nila Nephew, MD   400 mg at 08/07/17 0827  . methimazole (TAPAZOLE) tablet 10 mg  10 mg Oral Daily Nila Nephew, MD   10 mg at 08/07/17 0826  . metoprolol succinate (TOPROL-XL) 24 hr tablet 25 mg  25 mg Oral BID Nila Nephew, MD   25 mg at 08/07/17 0827  . nitroGLYCERIN (NITROSTAT) SL tablet 0.4 mg  0.4 mg Sublingual Q5 min PRN Nila Nephew, MD   0.4 mg at 08/06/17 1920  . pantoprazole (PROTONIX) EC tablet 40 mg  40 mg Oral Daily Nila Nephew, MD   40 mg at 08/07/17 4782  . potassium chloride SA (K-DUR,KLOR-CON) CR tablet 20 mEq  20 mEq Oral Q4H PRN Nila Nephew, MD      . sotalol (BETAPACE) tablet 120 mg  120 mg Oral Q12H Fransico Him R, MD   120 mg at 08/07/17 1011    Allergies as of 08/05/2017 - Review Complete 08/05/2017  Allergen Reaction Noted  . Dilaudid [hydromorphone hcl] Other (See Comments) 05/19/2016  . Novolog [insulin aspart] Shortness Of Breath 02/27/2015  . Codeine Nausea And Vomiting 09/28/2013  . Iodine Other (See Comments) 01/30/2013  . Penicillins Itching, Rash, and Other (See Comments) 01/30/2013  . Propofol Other (See Comments) 11/26/2015  . Ace inhibitors Cough 03/13/2015  . Demerol [meperidine] Nausea And Vomiting 08/15/2015  . Neosporin [neomycin-bacitracin zn-polymyx] Itching and Rash 01/30/2013  . Percocet [oxycodone-acetaminophen] Rash 08/15/2015  . Tape Itching and Rash 01/30/2013    Family History  Problem Relation Age of Onset  . Cancer Mother 59       bronchial cancer  . Breast cancer Mother   . Lung cancer Mother   . Hypertension Father   . COPD Father   . Heart disease Father 59       CAD with cardiac stenting  . Heart attack Father   . Parkinson's disease Father   . Allergies Sister   . Breast cancer Maternal Grandmother   . Emphysema Maternal Grandfather   . Leukemia Paternal Grandmother   . Emphysema Paternal Grandfather   . Thyroid  disease Neg Hx     Social History   Socioeconomic History  . Marital status: Single    Spouse name: Not on file  . Number of children: 2  . Years of education: Masters  . Highest education level: Not on file  Occupational History  . Occupation: Landscape architect  Social Needs  . Financial resource strain: Somewhat hard  . Food insecurity:    Worry: Sometimes true    Inability: Sometimes true  . Transportation needs:    Medical: No    Non-medical: No  Tobacco Use  . Smoking status: Former Smoker    Packs/day: 0.00    Years: 41.00    Pack years: 0.00    Types: Cigarettes    Last attempt to quit: 03/05/2015    Years since quitting: 2.4  . Smokeless tobacco: Never Used  . Tobacco comment: 04/29/2015 "quit smoking cigarettes 02/27/2015"  Substance and Sexual Activity  . Alcohol use: No  . Drug use: No  . Sexual activity: Not Currently    Birth control/protection: Post-menopausal, Surgical  Lifestyle  . Physical activity:    Days per week: 0 days    Minutes per session: 0 min  . Stress: Very much  Relationships  . Social connections:    Talks on phone: More than three times a week    Gets together: More than  three times a week    Attends religious service: More than 4 times per year    Active member of club or organization: No    Attends meetings of clubs or organizations: Never    Relationship status: Never married  . Intimate partner violence:    Fear of current or ex partner: No    Emotionally abused: No    Physically abused: No    Forced sexual activity: No  Other Topics Concern  . 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      Lives with sister in [place].      Tobacco: 1 ppd x 41 years - quit 2016      Alcohol: none       Drugs: none      Exercise:  Walking in neighborhood; physical job.   Right-handed.   2 cups caffeine daily.     Review of Systems: Pertinent positive and negative review of systems were noted in the above HPI section. Complete review of systems was performed and was otherwise normal.   Physical Exam: Vital signs in last 24 hours: Temp:  [98.2 F (36.8 C)-99.2 F (37.3 C)] 99 F (37.2 C) (03/31 1150) Pulse Rate:  [66-74] 66 (03/31 1420) Resp:  [14-18] 16 (03/31 1150) BP: (135-171)/(58-92) 142/61 (03/31 1420) SpO2:  [95 %-100 %] 100 % (03/31 1420) Weight:  [182 lb 4.8 oz (82.7 kg)] 182 lb 4.8 oz (82.7 kg) (03/31 0511) Last BM Date: 08/05/17 Constitutional: generally well-appearing Psychiatric: alert and oriented x3 Eyes: extraocular movements intact Mouth: oral pharynx moist, no lesions Neck: supple no lymphadenopathy Cardiovascular: heart regular rate and rhythm Lungs: clear to auscultation bilaterally Abdomen: soft, slightly tender LUQ, nondistended, no obvious ascites, no peritoneal signs, normal bowel sounds Extremities: no lower extremity edema bilaterally Skin: no lesions on visible extremities   Lab Results: Recent Labs    08/05/17 2159 08/06/17 0149 08/07/17 1518  WBC 10.8* 10.0 8.6  HGB 11.1* 10.7* 9.6*  HCT 34.2* 33.5* 30.4*  PLT 460* 399 384  MCV 89.3 89.8 89.7   BMET Recent Labs    08/05/17 2159 08/06/17 0149  NA 136 137  K 3.6 3.5  CL 94* 99*  CO2 27 26  GLUCOSE 189* 123*  BUN 22* 22*  CREATININE 0.95 0.83  CALCIUM 9.6 9.2  PT/INR Recent Labs    08/05/17 2159  LABPROT 13.1  INR 1.00     Impression/Plan: 67 y.o. female with red rectal bleeding with mild cramping and slight tenderness in LUQ  DDx seems either mild ischemic colitis or diverticular bleeding.  Ischemia perhaps from low flow state during her AFRVR?  Usually abd pain is a bit more prominent with ischemic colitis however.  Also could be simple diverticular bleeding (her  cramping is pretty mild and tenderness is slight).  CT scan may help with the differential, I will order that now.  She is on both Eliquis and plavix and that undoubtedly contributes to the volume of blood loss.  The vast majority of lower GI bleeding (and upper as well) will stop without intervention.  Currently no plans for invasive testing.  Eliquis has already been held and I agree with that.   Will change her to clears for now.  Milus Banister, MD  08/07/2017, 4:07 PM [facility] Pager [phone]

## 2017-08-07 NOTE — Progress Notes (Signed)
Progress Note  Patient Name: Judith [name] Date of Encounter: 08/07/2017  Primary Cardiologist: No primary care provider on file.   Subjective   Denies any chest pain or shortness of breath.  Remains in normal sinus rhythm  Inpatient Medications    Scheduled Meds: . amLODipine  5 mg Oral Daily  . apixaban  5 mg Oral BID  . atorvastatin  20 mg Oral Daily  . buPROPion  150 mg Oral BID WC  . clopidogrel  75 mg Oral Daily  . fenofibrate  54 mg Oral Daily  . FLUoxetine  20 mg Oral QHS  . gabapentin  900 mg Oral BID  . insulin aspart  0-15 Units Subcutaneous TID WC  . insulin aspart  0-5 Units Subcutaneous QHS  . insulin aspart  10 Units Subcutaneous TID WC  . insulin glargine  20 Units Subcutaneous Q2200  . magnesium oxide  400 mg Oral Daily  . methimazole  10 mg Oral Daily  . metoprolol succinate  25 mg Oral BID  . pantoprazole  40 mg Oral Daily  . sotalol  120 mg Oral Q12H   Continuous Infusions:  PRN Meds: benzonatate, ipratropium-albuterol, nitroGLYCERIN, potassium chloride   Vital Signs    Vitals:   08/07/17 0001 08/07/17 0511 08/07/17 0629 08/07/17 0800  BP: (!) 150/61 (!) 171/67 (!) 167/84 (!) 167/79  Pulse: 74 71    Resp:  18    Temp: 99.1 F (37.3 C) 98.2 F (36.8 C)    TempSrc: Oral Oral    SpO2: 97% 100% 96%   Weight:  182 lb 4.8 oz (82.7 kg)    Height:        Intake/Output Summary (Last 24 hours) at 08/07/2017 1135 Last data filed at 08/07/2017 1000 Gross per 24 hour  Intake 840 ml  Output 1800 ml  Net -960 ml   Filed Weights   08/06/17 1518 08/07/17 0511  Weight: 181 lb 11.2 oz (82.4 kg) 182 lb 4.8 oz (82.7 kg)    Telemetry    Sinus rhythm- Personally Reviewed  ECG    Normal sinus rhythm with no ST changes- Personally Reviewed  Physical Exam   GEN: No acute distress.   Neck: No JVD Cardiac: RRR, no murmurs, rubs, or gallops.  Respiratory: Clear to auscultation bilaterally. GI: Soft, nontender, non-distended  MS: No edema; No  deformity. Neuro:  Nonfocal  Psych: Normal affect   Labs    Chemistry Recent Labs  Lab 08/05/17 2159 08/06/17 0149  NA 136 137  K 3.6 3.5  CL 94* 99*  CO2 27 26  GLUCOSE 189* 123*  BUN 22* 22*  CREATININE 0.95 0.83  CALCIUM 9.6 9.2  GFRNONAA >60 >60  GFRAA >60 >60  ANIONGAP 15 12     Hematology Recent Labs  Lab 08/05/17 2159 08/06/17 0149  WBC 10.8* 10.0  RBC 3.83* 3.73*  HGB 11.1* 10.7*  HCT 34.2* 33.5*  MCV 89.3 89.8  MCH 29.0 28.7  MCHC 32.5 31.9  RDW 13.6 13.5  PLT 460* 399    Cardiac Enzymes Recent Labs  Lab 08/06/17 0149 08/06/17 0909 08/06/17 1528  TROPONINI 0.09* 0.07* 0.06*    Recent Labs  Lab 08/05/17 2142  TROPIPOC 0.01     BNPNo results for input(s): BNP, PROBNP in the last 168 hours.   DDimer No results for input(s): DDIMER in the last 168 hours.   Radiology    No results found.  Cardiac Studies   2D echo 07/05/2017 Study Conclusions  -  Left ventricle: The cavity size was normal. There was mild concentric hypertrophy. Systolic function was normal. The estimated ejection fraction was in the range of 55% to 60%. Wall motion was normal; there were no regional wall motion abnormalities. - Aortic valve: Valve mobility was restricted. There was moderate stenosis. Valve area (VTI): 1.46 cm^2. Valve area (Vmax): 1.42 cm^2. Valve area (Vmean): 1.42 cm^2. - Mitral valve: Calcified annulus. There was mild regurgitation directed centrally. - Left atrium: The atrium was moderately dilated.  Patient Profile     67 y.o. female  with a history of HTN, ASCAD with CTO of the RCA and high grade LCx/OM stenosis s/p DES x 2, hyperlipidemia, Type II DM,hyperthyroidism onmethimazole, atrial fibrillations/p multiple DCCV in the pastwho presented withAFRVR and chest pain.    Assessment & Plan    1.  Atrial fibrillation with rapid ventricular response --She has a history of multiple cardioversions in the past and has been  maintained in sinus rhythm on sotalol --Developed sudden onset of rapid heart palpitations  and then developed chest discomfort and in the ER was in A. fib with RVR --Converted to sinus rhythm after an IV bolus of amiodarone --Magnesium was low at 1.2 potassium was 3.6 --She was recently treated for bronchitis with a course of prednisone and had been using her nebulizer frequently which may have triggered the episode of A. Fib --TSH is normal --She continues to maintain normal sinus rhythm and QTC 12 lead EKG today was 462 ms --Continue increased dose of sotalol 120 mg twice daily --Continue to Eliquis 5 mg twice daily --Recommend outpatient sleep study given her morbid obesity and recurrent atrial fibrillation --We will also get her set up in A. fib clinic as an outpatient  2.  Chest pain with minimally elevated flat troponin increase --This is likely related to demand ischemia in the setting of atrial fibrillation with RVR and also underlying moderate aortic stenosis --EKG did show some ventricular aberration during A. fib with RVR but baseline EKG in normal sinus rhythm shows no ST abnormality --She denies any history of exertional chest pain and only has chest pain when she gets A. Fib --2D echocardiogram 07/05/2017 showed normal LV function --Repeat limited 2D echocardiogram pending to assess for any wall motion abnormalities.  This is normal and no further inpatient ischemic workup necessary and can discharge home and set up outpatient nuclear stress test.  3.  ASCAD -patient has known CTO of the RCA and high-grade left circumflex/OM stenosis status post DES x2 and residual 40% stenosis of the LAD. --I do not think minimal troponin bump is consistent with ACS and is likely demand ischemia in the setting of rapid atrial fibrillation --She has not had any exertional angina at home. --Continue atorvastatin 20 mg daily, Plavix 75 mg daily and Toprol-XL 25 mg twice daily.  She is not on  aspirin due to her apixaban. --If LV function is normal on 2D echocardiogram would recommend outpatient nuclear stress test  4.  Hyperlipidemia with LDL goal less than 70 --Continue atorvastatin and fenofibrate  5.  Hypertension --Blood pressure remains elevated today at 167/17mmHg --Continue Toprol-XL 25 mg twice daily --Increase amlodipine to 10 mg daily --She has been intolerant to ACE inhibitors in the past due to cough  6.  Type 2 diabetes mellitus --Continue home long-acting insulin with lower dose of mealtime insulin plus SSI --Hemoglobin A1c is 7.9 --She needs close follow-up with outpatient PCP  7.  Hypomagnesemia --This is been repleted and repeat  magnesium is 2.2 --Need to keep magnesium greater than 2 and potassium greater than 4 --Potassium 3.5 today so  will give K-Dur 40 mEq now  8.  Moderate aortic stenosis noted on 2D echocardiogram 07/05/2017 --Will need to follow with yearly echo    For questions or updates, please contact [place] Please consult [email or web address] for contact info under Cardiology/STEMI.      Signed, Fransico Him, MD  08/07/2017, 11:35 AM

## 2017-08-07 NOTE — Progress Notes (Signed)
Paged Dr. Ardis Hughs GI service the patient has had 3x rectal bleed with clots noted. Waiting for call back

## 2017-08-07 NOTE — Care Management Obs Status (Signed)
[facility] NOTIFICATION   Patient Details  Name: Leshay Desaulniers MRN: [id number] Date of Birth: 12-22-1950   Medicare Observation Status Notification Given:  Yes    Dawayne Patricia, RN 08/07/2017, 3:57 PM

## 2017-08-07 NOTE — Progress Notes (Signed)
Pt came to get nurse at nurses station, she reports bleeding from rectum, pt said she went to have bm and red liquid came from rectum, she said she flushed it because she got scared, she did have on pull up depends with blood and large clot noted, no bleeding at this time, vss, sr on telemetry, pt denies cp sob or dizziness, paged Phylliss Bob NP and discussed, she will review with Dr Radford Pax, pt reports normal colonscopy 3 months ago, she also says she has a hemorrhoid that will get spot blood on toilet paper at times but reports small bm this am and did not have to strain, pt instructed to call if further bleeding cp sob or dizziness

## 2017-08-07 NOTE — Progress Notes (Signed)
   I was called by nurse to report blood clot from rectum. Pt is stable. Discussed with Dr. Radford Pax. Eliquis stopped for now. CBC ordered. GI consult placed. Pt is followed at [facility]. I spoke with Dr. Ardis Hughs who will come see the patient.   The patient has had GI bleed in the past and has known diverticulosis and is at risk for recurrent GI bleeding as long as she is on Eliquis and Plavix and would like to know if one of those can be stopped.   Daune Perch, AGNP-C [facility] 08/07/2017  3:42 PM Pager: [phone]

## 2017-08-08 DIAGNOSIS — E0590 Thyrotoxicosis, unspecified without thyrotoxic crisis or storm: Secondary | ICD-10-CM | POA: Diagnosis present

## 2017-08-08 DIAGNOSIS — Z7902 Long term (current) use of antithrombotics/antiplatelets: Secondary | ICD-10-CM | POA: Diagnosis not present

## 2017-08-08 DIAGNOSIS — Z89511 Acquired absence of right leg below knee: Secondary | ICD-10-CM | POA: Diagnosis not present

## 2017-08-08 DIAGNOSIS — E785 Hyperlipidemia, unspecified: Secondary | ICD-10-CM | POA: Diagnosis present

## 2017-08-08 DIAGNOSIS — D62 Acute posthemorrhagic anemia: Secondary | ICD-10-CM

## 2017-08-08 DIAGNOSIS — J45909 Unspecified asthma, uncomplicated: Secondary | ICD-10-CM | POA: Diagnosis present

## 2017-08-08 DIAGNOSIS — K922 Gastrointestinal hemorrhage, unspecified: Secondary | ICD-10-CM | POA: Diagnosis not present

## 2017-08-08 DIAGNOSIS — Z87891 Personal history of nicotine dependence: Secondary | ICD-10-CM | POA: Diagnosis not present

## 2017-08-08 DIAGNOSIS — F329 Major depressive disorder, single episode, unspecified: Secondary | ICD-10-CM | POA: Diagnosis present

## 2017-08-08 DIAGNOSIS — E1122 Type 2 diabetes mellitus with diabetic chronic kidney disease: Secondary | ICD-10-CM | POA: Diagnosis present

## 2017-08-08 DIAGNOSIS — E1142 Type 2 diabetes mellitus with diabetic polyneuropathy: Secondary | ICD-10-CM | POA: Diagnosis present

## 2017-08-08 DIAGNOSIS — Z7901 Long term (current) use of anticoagulants: Secondary | ICD-10-CM

## 2017-08-08 DIAGNOSIS — Z955 Presence of coronary angioplasty implant and graft: Secondary | ICD-10-CM | POA: Diagnosis not present

## 2017-08-08 DIAGNOSIS — I248 Other forms of acute ischemic heart disease: Secondary | ICD-10-CM | POA: Diagnosis present

## 2017-08-08 DIAGNOSIS — K219 Gastro-esophageal reflux disease without esophagitis: Secondary | ICD-10-CM | POA: Diagnosis present

## 2017-08-08 DIAGNOSIS — K5730 Diverticulosis of large intestine without perforation or abscess without bleeding: Secondary | ICD-10-CM

## 2017-08-08 DIAGNOSIS — I480 Paroxysmal atrial fibrillation: Secondary | ICD-10-CM | POA: Diagnosis present

## 2017-08-08 DIAGNOSIS — Z794 Long term (current) use of insulin: Secondary | ICD-10-CM | POA: Diagnosis not present

## 2017-08-08 DIAGNOSIS — K2270 Barrett's esophagus without dysplasia: Secondary | ICD-10-CM | POA: Diagnosis present

## 2017-08-08 DIAGNOSIS — N181 Chronic kidney disease, stage 1: Secondary | ICD-10-CM | POA: Diagnosis present

## 2017-08-08 DIAGNOSIS — Z79899 Other long term (current) drug therapy: Secondary | ICD-10-CM | POA: Diagnosis not present

## 2017-08-08 DIAGNOSIS — K5791 Diverticulosis of intestine, part unspecified, without perforation or abscess with bleeding: Secondary | ICD-10-CM | POA: Diagnosis not present

## 2017-08-08 DIAGNOSIS — I472 Ventricular tachycardia: Secondary | ICD-10-CM | POA: Diagnosis present

## 2017-08-08 DIAGNOSIS — I350 Nonrheumatic aortic (valve) stenosis: Secondary | ICD-10-CM | POA: Diagnosis present

## 2017-08-08 DIAGNOSIS — I25118 Atherosclerotic heart disease of native coronary artery with other forms of angina pectoris: Secondary | ICD-10-CM | POA: Diagnosis present

## 2017-08-08 DIAGNOSIS — R1084 Generalized abdominal pain: Secondary | ICD-10-CM | POA: Diagnosis not present

## 2017-08-08 DIAGNOSIS — I129 Hypertensive chronic kidney disease with stage 1 through stage 4 chronic kidney disease, or unspecified chronic kidney disease: Secondary | ICD-10-CM | POA: Diagnosis present

## 2017-08-08 LAB — POCT I-STAT, CHEM 8
BUN: 24 mg/dL — ABNORMAL HIGH (ref 6–20)
CHLORIDE: 95 mmol/L — AB (ref 101–111)
CREATININE: 1 mg/dL (ref 0.44–1.00)
Calcium, Ion: 1.07 mmol/L — ABNORMAL LOW (ref 1.15–1.40)
GLUCOSE: 192 mg/dL — AB (ref 65–99)
HEMATOCRIT: 31 % — AB (ref 36.0–46.0)
Hemoglobin: 10.5 g/dL — ABNORMAL LOW (ref 12.0–15.0)
Potassium: 3.6 mmol/L (ref 3.5–5.1)
Sodium: 138 mmol/L (ref 135–145)
TCO2: 32 mmol/L (ref 22–32)

## 2017-08-08 LAB — CBC
HEMATOCRIT: 23.4 % — AB (ref 36.0–46.0)
Hemoglobin: 7.7 g/dL — ABNORMAL LOW (ref 12.0–15.0)
MCH: 29.2 pg (ref 26.0–34.0)
MCHC: 32.9 g/dL (ref 30.0–36.0)
MCV: 88.6 fL (ref 78.0–100.0)
PLATELETS: 364 10*3/uL (ref 150–400)
RBC: 2.64 MIL/uL — ABNORMAL LOW (ref 3.87–5.11)
RDW: 13.8 % (ref 11.5–15.5)
WBC: 12.6 10*3/uL — AB (ref 4.0–10.5)

## 2017-08-08 LAB — GLUCOSE, CAPILLARY
GLUCOSE-CAPILLARY: 133 mg/dL — AB (ref 65–99)
Glucose-Capillary: 217 mg/dL — ABNORMAL HIGH (ref 65–99)
Glucose-Capillary: 138 mg/dL — ABNORMAL HIGH (ref 65–99)

## 2017-08-08 LAB — HEMOGLOBIN AND HEMATOCRIT, BLOOD
HCT: 29.7 % — ABNORMAL LOW (ref 36.0–46.0)
Hemoglobin: 9.3 g/dL — ABNORMAL LOW (ref 12.0–15.0)

## 2017-08-08 LAB — PREPARE RBC (CROSSMATCH)

## 2017-08-08 MED ORDER — SODIUM CHLORIDE 0.9 % IV SOLN: Freq: Once | INTRAVENOUS | Status: DC

## 2017-08-08 NOTE — Progress Notes (Signed)
Paged MD for hgb=7.7 waiting for callback

## 2017-08-08 NOTE — Progress Notes (Signed)
1315- 2nd unit PRBC transfusion completed. No adverse reaction noted.

## 2017-08-08 NOTE — Progress Notes (Signed)
Talked to Dr Bubba Camp . Updated with hgb result of 7.7. Order noted to transfuse 2 units of PRBC.

## 2017-08-08 NOTE — Plan of Care (Signed)
  Problem: Clinical Measurements: Goal: Ability to maintain clinical measurements within normal limits will improve Outcome: Progressing   Problem: Clinical Measurements: Goal: Diagnostic test results will improve Outcome: Progressing   Problem: Activity: Goal: Risk for activity intolerance will decrease Outcome: Progressing   

## 2017-08-08 NOTE — Progress Notes (Signed)
Progress Note  Patient Name: Judith [name] Date of Encounter: 08/08/2017  Primary Cardiologist: Dr Radford Pax  Subjective   No chest pain or dyspnea  Inpatient Medications    Scheduled Meds: . amLODipine  10 mg Oral Daily  . atorvastatin  20 mg Oral Daily  . buPROPion  150 mg Oral BID WC  . clopidogrel  75 mg Oral Daily  . fenofibrate  54 mg Oral Daily  . FLUoxetine  20 mg Oral QHS  . gabapentin  900 mg Oral BID  . insulin aspart  0-15 Units Subcutaneous TID WC  . insulin aspart  0-5 Units Subcutaneous QHS  . insulin aspart  10 Units Subcutaneous TID WC  . insulin glargine  20 Units Subcutaneous Q2200  . magnesium oxide  400 mg Oral Daily  . methimazole  10 mg Oral Daily  . metoprolol succinate  25 mg Oral BID  . pantoprazole  40 mg Oral Daily  . sotalol  120 mg Oral Q12H   Continuous Infusions: . sodium chloride     PRN Meds: benzonatate, ipratropium-albuterol, nitroGLYCERIN, potassium chloride   Vital Signs    Vitals:   08/08/17 0420 08/08/17 0742 08/08/17 0954 08/08/17 1009  BP: 112/60 (!) 150/73 140/66 (!) 146/68  Pulse: 68 70 66 67  Resp: 16 16 16 16   Temp: 97.6 F (36.4 C)  (!) 97.4 F (36.3 C) 98.6 F (37 C)  TempSrc: Oral Oral Oral Oral  SpO2:    98%  Weight:      Height:        Intake/Output Summary (Last 24 hours) at 08/08/2017 1018 Last data filed at 08/08/2017 0910 Gross per 24 hour  Intake 1610 ml  Output 701 ml  Net 909 ml   Filed Weights   08/06/17 1518 08/07/17 0511  Weight: 181 lb 11.2 oz (82.4 kg) 182 lb 4.8 oz (82.7 kg)    Telemetry    Sinus - Personally Reviewed  Physical Exam   GEN: No acute distress.   Neck: No JVD Cardiac: RRR, no murmurs, rubs, or gallops.  Respiratory: Clear to auscultation bilaterally. GI: Soft, nontender, non-distended  MS: No edema Neuro:  Nonfocal  Psych: Normal affect   Labs    Chemistry Recent Labs  Lab 08/05/17 2159 08/06/17 0149  NA 136 137  K 3.6 3.5  CL 94* 99*  CO2 27 26  GLUCOSE  189* 123*  BUN 22* 22*  CREATININE 0.95 0.83  CALCIUM 9.6 9.2  GFRNONAA >60 >60  GFRAA >60 >60  ANIONGAP 15 12     Hematology Recent Labs  Lab 08/06/17 0149 08/07/17 1518 08/08/17 0017  WBC 10.0 8.6 12.6*  RBC 3.73* 3.39* 2.64*  HGB 10.7* 9.6* 7.7*  HCT 33.5* 30.4* 23.4*  MCV 89.8 89.7 88.6  MCH 28.7 28.3 29.2  MCHC 31.9 31.6 32.9  RDW 13.5 13.5 13.8  PLT 399 384 364    Cardiac Enzymes Recent Labs  Lab 08/06/17 0149 08/06/17 0909 08/06/17 1528  TROPONINI 0.09* 0.07* 0.06*    Recent Labs  Lab 08/05/17 2142  TROPIPOC 0.01      Radiology    Ct Abdomen Pelvis Wo Contrast  Result Date: 08/08/2017 CLINICAL DATA:  Left lower quadrant abdominal pain/tenderness and hematochezia. EXAM: CT ABDOMEN AND PELVIS WITHOUT CONTRAST TECHNIQUE: Multidetector CT imaging of the abdomen and pelvis was performed following the standard protocol without IV contrast. COMPARISON:  05/10/2016 FINDINGS: Lower chest: The lung bases are clear of acute process. No pleural effusion or pulmonary  lesions. The heart is normal in size. No pericardial effusion. Coronary artery and mitral valve annular calcifications are noted. The distal esophagus and aorta are unremarkable. Hepatobiliary: No focal hepatic lesions or intrahepatic biliary dilatation. Small layering gallstones are noted the gallbladder but no findings for acute cholecystitis. Normal caliber common bile duct. Pancreas: Significant pancreatic atrophy but no mass, inflammation or ductal dilatation. Duodenum diverticulum noted near the pancreatic head. Spleen: Normal size.  No focal lesions. Adrenals/Urinary Tract: The adrenal glands are normal. No renal, ureteral or bladder calculi or mass. Stomach/Bowel: The stomach, duodenum, small bowel and colon are unremarkable. No acute inflammatory changes, mass lesions or obstructive findings. Moderate descending and sigmoid colon diverticulosis but no findings for acute diverticulitis. The terminal ileum and  appendix are normal. Vascular/Lymphatic: Advanced atherosclerotic calcifications involving the aorta and branch vessels but no focal aneurysm. Small scattered mesenteric and retroperitoneal lymph nodes but no mass or adenopathy. Reproductive: The uterus is surgically absent. Both ovaries are still present and appear normal. Other: No pelvic mass or adenopathy. No free pelvic fluid collections. No inguinal mass or adenopathy. No abdominal wall hernia or subcutaneous lesions. Musculoskeletal: No significant bony findings. IMPRESSION: 1. No acute abdominal/pelvic findings, mass lesions or adenopathy. 2. Colonic diverticulosis without findings for acute diverticulitis. 3. Cholelithiasis but no CT findings for acute cholecystitis. 4. Age advanced vascular calcifications but no aneurysm. Electronically Signed   By: Marijo Sanes M.D.   On: 08/08/2017 08:38     Patient Profile     67 y.o. 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 Patient converted to sinus rhythm in the emergency room with amiodarone.  She then developed a GI bleed.    Assessment & Plan    1 Paroxysmal atrial fibrillation-patient remains in sinus rhythm this morning.  Her sotalol has been increased.  Repeat electrocardiogram tomorrow morning.  Follow QT interval.  Hold apixaban in the setting of GI bleed.  2 GI bleed-gastroenterology is following.  This is felt likely to be a diverticular bleed.  Significant drop in hemoglobin to 7.7.  She is being transfused.  Follow hemoglobin closely.  Hold anticoagulation.  3  3 Coronary artery disease-patient has had prior PCI of circumflex/obtuse marginal in October 2016.  I do not think she will tolerate both apixaban and Plavix long-term.  I will discontinue Plavix.  We will resume  apixaban when hemoglobin stable and no further issues with GI bleeding to see if she tolerates.  Continue statin.  4 hyperlipidemia-continue statin.  5 mildly elevated troponin-minimal elevation with no clear trend.  Occurred in the setting of atrial fibrillation with rapid ventricular response.  No plans for further ischemia evaluation.  Not consistent with acute coronary syndrome.  6 moderate aortic stenosis-she will need follow-up echocardiograms in the future.  7 hypertension-continue present medications and follow.  For questions or updates, please contact [place] Please consult [email or web address] for contact info under Cardiology/STEMI.      Signed, Kirk Ruths, MD  08/08/2017, 10:18 AM

## 2017-08-08 NOTE — Progress Notes (Addendum)
Daily Rounding Note  08/08/2017, 8:19 AM  LOS: 0 days   SUBJECTIVE:   Chief complaint:  Blood ordered with drop of Hgb to 7.7. Hematochezia over 2 to 3 hours last night,  Last was ~ 930 PM while undergoing CT scan.  Mild 2/10 cramping pain resolved once bleeding ceased.  No N/V.     OBJECTIVE:         Vital signs in last 24 hours:    Temp:  [97.6 F (36.4 C)-99.2 F (37.3 C)] 97.6 F (36.4 C) (04/01 0420) Pulse Rate:  [66-71] 70 (04/01 0742) Resp:  [16-17] 16 (04/01 0742) BP: (109-164)/(49-92) 150/73 (04/01 0742) SpO2:  [92 %-100 %] 92 % (04/01 0354) Last BM Date: 08/07/17 Filed Weights   08/06/17 1518 08/07/17 0511  Weight: 181 lb 11.2 oz (82.4 kg) 182 lb 4.8 oz (82.7 kg)   General: looks pale and somewhat chronically ill.  Alert.  comfortable   Heart: RRR with 1 -2/6 soft murmer Chest: clear bil.  No dyspnea.  Voice raspy.   Abdomen: soft, NT, ND.  Active BS  Extremities: no CCE Neuro/Psych:  Oriented x 3.  Moving all 4 limbs.  No tremor.    Intake/Output from previous day: 03/31 0701 - 04/01 0700 In: 2952 [P.O.:1200; I.V.:500; Blood:30] Out: 1401 [Urine:1400]  Intake/Output this shift: No intake/output data recorded.  Lab Results: Recent Labs    08/06/17 0149 08/07/17 1518 08/08/17 0017  WBC 10.0 8.6 12.6*  HGB 10.7* 9.6* 7.7*  HCT 33.5* 30.4* 23.4*  PLT 399 384 364   BMET Recent Labs    08/05/17 2159 08/06/17 0149  NA 136 137  K 3.6 3.5  CL 94* 99*  CO2 27 26  GLUCOSE 189* 123*  BUN 22* 22*  CREATININE 0.95 0.83  CALCIUM 9.6 9.2   LFT No results for input(s): PROT, ALBUMIN, AST, ALT, ALKPHOS, BILITOT, BILIDIR, IBILI in the last 72 hours. PT/INR Recent Labs    08/05/17 2159  LABPROT 13.1  INR 1.00   Scheduled Meds: . amLODipine  10 mg Oral Daily  . atorvastatin  20 mg Oral Daily  . buPROPion  150 mg Oral BID WC  . clopidogrel  75 mg Oral Daily  . fenofibrate  54 mg Oral Daily    . FLUoxetine  20 mg Oral QHS  . gabapentin  900 mg Oral BID  . insulin aspart  0-15 Units Subcutaneous TID WC  . insulin aspart  0-5 Units Subcutaneous QHS  . insulin aspart  10 Units Subcutaneous TID WC  . insulin glargine  20 Units Subcutaneous Q2200  . magnesium oxide  400 mg Oral Daily  . methimazole  10 mg Oral Daily  . metoprolol succinate  25 mg Oral BID  . pantoprazole  40 mg Oral Daily  . sotalol  120 mg Oral Q12H   Continuous Infusions: . sodium chloride     PRN Meds:.benzonatate, ipratropium-albuterol, nitroGLYCERIN, potassium chloride   ASSESMENT:   *  Hematochezia with mild abd pain.   Advanced diverticulosis, small tubular adenoma per 10/2016 EGD. Gastritis and short segment Barretts 2010.   ? Mild ischemic colitis vs diverticular bleed.   On once daily Protonix, per home routine.     *  ABL anemia.  Hx IDA.  Hgb down 3.5 gm from admission, down 4.5 from 2 weeks ago.     *  Chronic Plavix, Eliquis.  A fib.   Eliquis, Plasvix on hold, last  dose AM of 3/31.       *  Mild azotemia without renal insufficiency.    *  Mild elevation Troponin I.    PLAN   *  2 U PRBCs ordered.  Awaiting report of CT abdomen completed 2227 last night.    *  hgb at 1500 and CBC in AM.     Azucena Freed PA-C [phone]   Addendum, CT results:   IMPRESSION: 1. No acute abdominal/pelvic findings, mass lesions or adenopathy. 2. Colonic diverticulosis without findings for acute diverticulitis. 3. Cholelithiasis but no CT findings for acute cholecystitis. 4. Age advanced vascular calcifications but no aneurysm.  Azucena Freed  08/08/2017, 8:19 AM Pager: [phone]    Attending physician's note   I have taken an interval history, reviewed the chart and examined the patient. I agree with the Advanced Practitioner's note, impression and recommendations. Diverticular bleed, resolving. Trend CBC. No endoscopic evaluation planned.   Lucio Edward, MD [facility] [phone] Mon-Fri  8a-5p [phone] after 5p, weekends, holidays

## 2017-08-09 LAB — CBC
HCT: 31 % — ABNORMAL LOW (ref 36.0–46.0)
Hemoglobin: 10.2 g/dL — ABNORMAL LOW (ref 12.0–15.0)
MCH: 28.8 pg (ref 26.0–34.0)
MCHC: 32.9 g/dL (ref 30.0–36.0)
MCV: 87.6 fL (ref 78.0–100.0)
Platelets: 334 K/uL (ref 150–400)
RBC: 3.54 MIL/uL — ABNORMAL LOW (ref 3.87–5.11)
RDW: 15 % (ref 11.5–15.5)
WBC: 9 K/uL (ref 4.0–10.5)

## 2017-08-09 LAB — GLUCOSE, CAPILLARY
GLUCOSE-CAPILLARY: 146 mg/dL — AB (ref 65–99)
Glucose-Capillary: 192 mg/dL — ABNORMAL HIGH (ref 65–99)
Glucose-Capillary: 163 mg/dL — ABNORMAL HIGH (ref 65–99)
Glucose-Capillary: 266 mg/dL — ABNORMAL HIGH (ref 65–99)
Glucose-Capillary: 324 mg/dL — ABNORMAL HIGH (ref 65–99)

## 2017-08-09 LAB — BASIC METABOLIC PANEL
Anion gap: 9 (ref 5–15)
BUN: 11 mg/dL (ref 6–20)
CHLORIDE: 105 mmol/L (ref 101–111)
CO2: 26 mmol/L (ref 22–32)
CREATININE: 0.76 mg/dL (ref 0.44–1.00)
Calcium: 9.1 mg/dL (ref 8.9–10.3)
GFR calc Af Amer: >60 mL/min (ref >60)
GFR calc non Af Amer: >60 mL/min (ref >60)
GLUCOSE: 144 mg/dL — AB (ref 65–99)
POTASSIUM: 4.3 mmol/L (ref 3.5–5.1)
Sodium: 140 mmol/L (ref 135–145)

## 2017-08-09 NOTE — Progress Notes (Signed)
Pt is stable, vitals stable, pt is getting up and using bathroom overnight, no any specific complain of pain, denies CP and SOB, will continue to monitor the patient  Palma Holter, RN

## 2017-08-09 NOTE — Progress Notes (Signed)
Progress Note  Patient Name: Judith [name] Date of Encounter: 08/09/2017  Primary Cardiologist: Dr Radford Pax  Subjective   Pt denies CP or dyspnea; no hematochezia   Inpatient Medications    Scheduled Meds: . amLODipine  10 mg Oral Daily  . atorvastatin  20 mg Oral Daily  . buPROPion  150 mg Oral BID WC  . fenofibrate  54 mg Oral Daily  . FLUoxetine  20 mg Oral QHS  . gabapentin  900 mg Oral BID  . insulin aspart  0-15 Units Subcutaneous TID WC  . insulin aspart  0-5 Units Subcutaneous QHS  . insulin aspart  10 Units Subcutaneous TID WC  . insulin glargine  20 Units Subcutaneous Q2200  . magnesium oxide  400 mg Oral Daily  . methimazole  10 mg Oral Daily  . metoprolol succinate  25 mg Oral BID  . pantoprazole  40 mg Oral Daily  . sotalol  120 mg Oral Q12H   Continuous Infusions: . sodium chloride     PRN Meds: benzonatate, ipratropium-albuterol, nitroGLYCERIN, potassium chloride   Vital Signs    Vitals:   08/08/17 1315 08/08/17 2036 08/09/17 0003 08/09/17 0421  BP: (!) 110/92 102/79 130/72 135/64  Pulse: 67 69 67 68  Resp: 14 16 16 18   Temp: 98.2 F (36.8 C) 98.4 F (36.9 C) 98.2 F (36.8 C) 98.1 F (36.7 C)  TempSrc: Oral Oral Oral Oral  SpO2: 100% 100% 98% 94%  Weight:    173 lb (78.5 kg)  Height:        Intake/Output Summary (Last 24 hours) at 08/09/2017 1130 Last data filed at 08/09/2017 0918 Gross per 24 hour  Intake 635 ml  Output 2600 ml  Net -1965 ml   Filed Weights   08/06/17 1518 08/07/17 0511 08/09/17 0421  Weight: 181 lb 11.2 oz (82.4 kg) 182 lb 4.8 oz (82.7 kg) 173 lb (78.5 kg)    Telemetry    Sinus - Personally Reviewed  Physical Exam   GEN: WD/WN No acute distress.   Neck: No JVDSupple Cardiac: RRR, 2/6 systolic murmur Respiratory: Clear to auscultation bilaterally; no wheeze. GI: Soft, nontender, non-distended, no masses MS: No edema Neuro:  Grossly intact   Labs    Chemistry Recent Labs  Lab 08/05/17 2159 08/06/17 0149  08/09/17 0439  NA 136 137 140  K 3.6 3.5 4.3  CL 94* 99* 105  CO2 27 26 26   GLUCOSE 189* 123* 144*  BUN 22* 22* 11  CREATININE 0.95 0.83 0.76  CALCIUM 9.6 9.2 9.1  GFRNONAA >60 >60 >60  GFRAA >60 >60 >60  ANIONGAP 15 12 9      Hematology Recent Labs  Lab 08/07/17 1518 08/08/17 0017 08/08/17 1437 08/09/17 0439  WBC 8.6 12.6*  --  9.0  RBC 3.39* 2.64*  --  3.54*  HGB 9.6* 7.7* 9.3* 10.2*  HCT 30.4* 23.4* 29.7* 31.0*  MCV 89.7 88.6  --  87.6  MCH 28.3 29.2  --  28.8  MCHC 31.6 32.9  --  32.9  RDW 13.5 13.8  --  15.0  PLT 384 364  --  334    Cardiac Enzymes Recent Labs  Lab 08/06/17 0149 08/06/17 0909 08/06/17 1528  TROPONINI 0.09* 0.07* 0.06*    Recent Labs  Lab 08/05/17 2142  TROPIPOC 0.01      Radiology    Ct Abdomen Pelvis Wo Contrast  Result Date: 08/08/2017 CLINICAL DATA:  Left lower quadrant abdominal pain/tenderness and hematochezia. EXAM: CT ABDOMEN  AND PELVIS WITHOUT CONTRAST TECHNIQUE: Multidetector CT imaging of the abdomen and pelvis was performed following the standard protocol without IV contrast. COMPARISON:  05/10/2016 FINDINGS: Lower chest: The lung bases are clear of acute process. No pleural effusion or pulmonary lesions. The heart is normal in size. No pericardial effusion. Coronary artery and mitral valve annular calcifications are noted. The distal esophagus and aorta are unremarkable. Hepatobiliary: No focal hepatic lesions or intrahepatic biliary dilatation. Small layering gallstones are noted the gallbladder but no findings for acute cholecystitis. Normal caliber common bile duct. Pancreas: Significant pancreatic atrophy but no mass, inflammation or ductal dilatation. Duodenum diverticulum noted near the pancreatic head. Spleen: Normal size.  No focal lesions. Adrenals/Urinary Tract: The adrenal glands are normal. No renal, ureteral or bladder calculi or mass. Stomach/Bowel: The stomach, duodenum, small bowel and colon are unremarkable. No acute  inflammatory changes, mass lesions or obstructive findings. Moderate descending and sigmoid colon diverticulosis but no findings for acute diverticulitis. The terminal ileum and appendix are normal. Vascular/Lymphatic: Advanced atherosclerotic calcifications involving the aorta and branch vessels but no focal aneurysm. Small scattered mesenteric and retroperitoneal lymph nodes but no mass or adenopathy. Reproductive: The uterus is surgically absent. Both ovaries are still present and appear normal. Other: No pelvic mass or adenopathy. No free pelvic fluid collections. No inguinal mass or adenopathy. No abdominal wall hernia or subcutaneous lesions. Musculoskeletal: No significant bony findings. IMPRESSION: 1. No acute abdominal/pelvic findings, mass lesions or adenopathy. 2. Colonic diverticulosis without findings for acute diverticulitis. 3. Cholelithiasis but no CT findings for acute cholecystitis. 4. Age advanced vascular calcifications but no aneurysm. Electronically Signed   By: Marijo Sanes M.D.   On: 08/08/2017 08:38     Patient Profile     67 y.o. 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 Patient converted to sinus rhythm in the emergency room with amiodarone.  She then developed a GI bleed.    Assessment & Plan    1 Paroxysmal atrial fibrillation-patient remains in sinus rhythm.  Her sotalol has been increased.  ECG today with QTC 0.47; repeat ECG in AM.  Hold apixaban in the setting of GI bleed.  2 GI bleed-gastroenterology is following.  This is felt likely to be a diverticular bleed.  Hgb unchanged compared to previous; will need input from GI concerning timing of resuming apixaban.  3 Coronary artery disease-patient has had prior PCI of circumflex/obtuse marginal in  October 2016.  I do not think she will tolerate both apixaban and Plavix long-term.  Plavix DCed. Continue statin.  4 hyperlipidemia-continue statin.  5 mildly elevated troponin-As outlined previously, no plans for further ischemia eval.  6 moderate aortic stenosis-she will need follow-up echocardiograms in the future.  7 hypertension-BP controlled. Continue present medications and follow.  Possible DC in AM if hgb stable.  For questions or updates, please contact [place] Please consult [email or web address] for contact info under Cardiology/STEMI.      Signed, Kirk Ruths, MD  08/09/2017, 11:30 AM

## 2017-08-09 NOTE — Progress Notes (Addendum)
Daily Rounding Note  08/09/2017, 9:58 AM  LOS: 1 day   SUBJECTIVE:   Chief complaint:   Feels well. No recurrent bleeding.     OBJECTIVE:         Vital signs in last 24 hours:    Temp:  [98.1 F (36.7 C)-98.6 F (37 C)] 98.1 F (36.7 C) (04/02 0421) Pulse Rate:  [67-69] 68 (04/02 0421) Resp:  [14-18] 18 (04/02 0421) BP: (102-146)/(64-92) 135/64 (04/02 0421) SpO2:  [94 %-100 %] 94 % (04/02 0421) Weight:  [173 lb (78.5 kg)] 173 lb (78.5 kg) (04/02 0421) Last BM Date: 08/07/17 Filed Weights   08/06/17 1518 08/07/17 0511 08/09/17 0421  Weight: 181 lb 11.2 oz (82.4 kg) 182 lb 4.8 oz (82.7 kg) 173 lb (78.5 kg)   General: looks well.   Heart: RRR Chest: no dyspnea, clear Abdomen: soft, NT, NT  Extremities: no CCE Neuro/Psych:  Pleasant, no gross deficits   Intake/Output from previous day: 04/01 0701 - 04/02 0700 In: 755 [P.O.:440; Blood:315] Out: 2600 [Urine:2600]  Intake/Output this shift: Total I/O In: 120 [P.O.:120] Out: -   Lab Results: Recent Labs    08/07/17 1518 08/08/17 0017 08/08/17 1437 08/09/17 0439  WBC 8.6 12.6*  --  9.0  HGB 9.6* 7.7* 9.3* 10.2*  HCT 30.4* 23.4* 29.7* 31.0*  PLT 384 364  --  334   BMET Recent Labs    08/09/17 0439  NA 140  K 4.3  CL 105  CO2 26  GLUCOSE 144*  BUN 11  CREATININE 0.76  CALCIUM 9.1    Studies/Results: Ct Abdomen Pelvis Wo Contrast  Result Date: 08/08/2017 CLINICAL DATA:  Left lower quadrant abdominal pain/tenderness and hematochezia. EXAM: CT ABDOMEN AND PELVIS WITHOUT CONTRAST TECHNIQUE: Multidetector CT imaging of the abdomen and pelvis was performed following the standard protocol without IV contrast. COMPARISON:  05/10/2016 FINDINGS: Lower chest: The lung bases are clear of acute process. No pleural effusion or pulmonary lesions. The heart is normal in size. No pericardial effusion. Coronary artery and mitral valve annular calcifications are  noted. The distal esophagus and aorta are unremarkable. Hepatobiliary: No focal hepatic lesions or intrahepatic biliary dilatation. Small layering gallstones are noted the gallbladder but no findings for acute cholecystitis. Normal caliber common bile duct. Pancreas: Significant pancreatic atrophy but no mass, inflammation or ductal dilatation. Duodenum diverticulum noted near the pancreatic head. Spleen: Normal size.  No focal lesions. Adrenals/Urinary Tract: The adrenal glands are normal. No renal, ureteral or bladder calculi or mass. Stomach/Bowel: The stomach, duodenum, small bowel and colon are unremarkable. No acute inflammatory changes, mass lesions or obstructive findings. Moderate descending and sigmoid colon diverticulosis but no findings for acute diverticulitis. The terminal ileum and appendix are normal. Vascular/Lymphatic: Advanced atherosclerotic calcifications involving the aorta and branch vessels but no focal aneurysm. Small scattered mesenteric and retroperitoneal lymph nodes but no mass or adenopathy. Reproductive: The uterus is surgically absent. Both ovaries are still present and appear normal. Other: No pelvic mass or adenopathy. No free pelvic fluid collections. No inguinal mass or adenopathy. No abdominal wall hernia or subcutaneous lesions. Musculoskeletal: No significant bony findings. IMPRESSION: 1. No acute abdominal/pelvic findings, mass lesions or adenopathy. 2. Colonic diverticulosis without findings for acute diverticulitis. 3. Cholelithiasis but no CT findings for acute cholecystitis. 4. Age advanced vascular calcifications but no aneurysm. Electronically Signed   By: Marijo Sanes M.D.   On: 08/08/2017 08:38   Scheduled Meds: . amLODipine  10  mg Oral Daily  . atorvastatin  20 mg Oral Daily  . buPROPion  150 mg Oral BID WC  . fenofibrate  54 mg Oral Daily  . FLUoxetine  20 mg Oral QHS  . gabapentin  900 mg Oral BID  . insulin aspart  0-15 Units Subcutaneous TID WC  .  insulin aspart  0-5 Units Subcutaneous QHS  . insulin aspart  10 Units Subcutaneous TID WC  . insulin glargine  20 Units Subcutaneous Q2200  . magnesium oxide  400 mg Oral Daily  . methimazole  10 mg Oral Daily  . metoprolol succinate  25 mg Oral BID  . pantoprazole  40 mg Oral Daily  . sotalol  120 mg Oral Q12H   Continuous Infusions: . sodium chloride     PRN Meds:.benzonatate, ipratropium-albuterol, nitroGLYCERIN, potassium chloride  ASSESMENT:   *  Hematochezia with mild abd pain.  ? Mild ischemic colitis vs diverticular bleed.   Noncontrast CT with diverticulosis, advanced atherosclerosis of aorta and branch vessels, no colitis.    Advanced diverticulosis, small tubular adenoma per 10/2016 EGD. Gastritis and short segment Barretts 2010.   On once daily Protonix, per home routine.     *  ABL anemia.  Hx IDA.  Hgb down 3.5 gm from admission, down 4.5 from 2 weeks ago.    S/p PRBC x 2 U.  Hgb 7.7 >> 10.2.    *  Chronic Plavix, Eliquis, on hold.  Last doses 3/31 AM.  Hx A fib.       *  Mild elevation Troponin I.     PLAN   *  Ok to discharge.     Judith [name]  08/09/2017, 9:58 AM     Attending physician's note   I have taken an interval history, reviewed the chart and examined the patient. I agree with the Advanced Practitioner's note, impression and recommendations.  Presumed diverticular bleed which has resolved. No recurrent bleeding.  It's possible that she had mild ischemic colitis however this was not noted on CT.  Cardiology, Dr. Stanford Breed, [place] to discontinue Plavix long term.  From GI standpoint would wait at least 5 days before resuming Eliquis as indicated.  OK for discharge from GI standpoint.  GI signing off.  Outpatient GI follow up as needed with Dr. Jonette Mate, MD [facility] [phone] office

## 2017-08-10 LAB — CBC
HCT: 29.5 % — ABNORMAL LOW (ref 36.0–46.0)
Hemoglobin: 9.3 g/dL — ABNORMAL LOW (ref 12.0–15.0)
MCH: 27.7 pg (ref 26.0–34.0)
MCHC: 31.5 g/dL (ref 30.0–36.0)
MCV: 87.8 fL (ref 78.0–100.0)
PLATELETS: 382 10*3/uL (ref 150–400)
RBC: 3.36 MIL/uL — AB (ref 3.87–5.11)
RDW: 14.6 % (ref 11.5–15.5)
WBC: 8 10*3/uL (ref 4.0–10.5)

## 2017-08-10 LAB — GLUCOSE, CAPILLARY: GLUCOSE-CAPILLARY: 127 mg/dL — AB (ref 65–99)

## 2017-08-10 MED ORDER — AMLODIPINE BESYLATE 10 MG PO TABS: 10.0000 mg | ORAL_TABLET | Freq: Every day | ORAL | 3 refills | Status: DC

## 2017-08-10 MED ORDER — SOTALOL HCL 120 MG PO TABS: 120.0000 mg | ORAL_TABLET | Freq: Two times a day (BID) | ORAL | 3 refills | Status: DC

## 2017-08-10 MED ORDER — MAGNESIUM OXIDE 400 (241.3 MG) MG PO TABS: 400.0000 mg | ORAL_TABLET | Freq: Every day | ORAL | 6 refills | Status: DC

## 2017-08-10 NOTE — Progress Notes (Signed)
Patient resting comfortably during shift report. Denies complaints.  

## 2017-08-10 NOTE — Progress Notes (Addendum)
Progress Note  Patient Name: Judith [name] Date of Encounter: 08/10/2017  Primary Cardiologist: Fransico Him, MD   Subjective   Pt feels well, no bleeding. Needs to walk in the hall this morning.  Inpatient Medications    Scheduled Meds: . amLODipine  10 mg Oral Daily  . atorvastatin  20 mg Oral Daily  . buPROPion  150 mg Oral BID WC  . fenofibrate  54 mg Oral Daily  . FLUoxetine  20 mg Oral QHS  . gabapentin  900 mg Oral BID  . insulin aspart  0-15 Units Subcutaneous TID WC  . insulin aspart  0-5 Units Subcutaneous QHS  . insulin aspart  10 Units Subcutaneous TID WC  . insulin glargine  20 Units Subcutaneous Q2200  . magnesium oxide  400 mg Oral Daily  . methimazole  10 mg Oral Daily  . metoprolol succinate  25 mg Oral BID  . pantoprazole  40 mg Oral Daily  . sotalol  120 mg Oral Q12H   Continuous Infusions: . sodium chloride     PRN Meds: benzonatate, ipratropium-albuterol, nitroGLYCERIN, potassium chloride   Vital Signs    Vitals:   08/09/17 0003 08/09/17 0421 08/09/17 1937 08/10/17 0530  BP: 130/72 135/64 (!) 125/58 (!) 152/61  Pulse: 67 68 68 63  Resp: 16 18 18 18   Temp: 98.2 F (36.8 C) 98.1 F (36.7 C) 98.7 F (37.1 C) 97.8 F (36.6 C)  TempSrc: Oral Oral Oral Oral  SpO2: 98% 94% 98% 99%  Weight:  173 lb (78.5 kg)  173 lb 9.6 oz (78.7 kg)  Height:        Intake/Output Summary (Last 24 hours) at 08/10/2017 0842 Last data filed at 08/09/2017 1257 Gross per 24 hour  Intake 360 ml  Output -  Net 360 ml   Filed Weights   08/07/17 0511 08/09/17 0421 08/10/17 0530  Weight: 182 lb 4.8 oz (82.7 kg) 173 lb (78.5 kg) 173 lb 9.6 oz (78.7 kg)    Telemetry    Sinus rhythm - Personally Reviewed  ECG    No new tracings - Personally Reviewed  Physical Exam   GEN: No acute distress.   Neck: No JVD Cardiac: RRR, + systolic murmur Respiratory: Clear to auscultation bilaterally. GI: Soft, nontender, non-distended  MS: No edema; No deformity. Neuro:   Nonfocal  Psych: Normal affect   Labs    Chemistry Recent Labs  Lab 08/05/17 2159 08/06/17 0149 08/09/17 0439  NA 136 137 140  K 3.6 3.5 4.3  CL 94* 99* 105  CO2 27 26 26   GLUCOSE 189* 123* 144*  BUN 22* 22* 11  CREATININE 0.95 0.83 0.76  CALCIUM 9.6 9.2 9.1  GFRNONAA >60 >60 >60  GFRAA >60 >60 >60  ANIONGAP 15 12 9      Hematology Recent Labs  Lab 08/08/17 0017 08/08/17 1437 08/09/17 0439 08/10/17 0436  WBC 12.6*  --  9.0 8.0  RBC 2.64*  --  3.54* 3.36*  HGB 7.7* 9.3* 10.2* 9.3*  HCT 23.4* 29.7* 31.0* 29.5*  MCV 88.6  --  87.6 87.8  MCH 29.2  --  28.8 27.7  MCHC 32.9  --  32.9 31.5  RDW 13.8  --  15.0 14.6  PLT 364  --  334 382    Cardiac Enzymes Recent Labs  Lab 08/06/17 0149 08/06/17 0909 08/06/17 1528  TROPONINI 0.09* 0.07* 0.06*    Recent Labs  Lab 08/05/17 2142  TROPIPOC 0.01     BNPNo results  for input(s): BNP, PROBNP in the last 168 hours.   DDimer No results for input(s): DDIMER in the last 168 hours.   Radiology    No results found.  Cardiac Studies   Echocardiogram 07/05/17: Study Conclusions - Left ventricle: The cavity size was normal. There was mild   concentric hypertrophy. Systolic function was normal. The   estimated ejection fraction was in the range of 55% to 60%. Wall   motion was normal; there were no regional wall motion   abnormalities. - Aortic valve: Valve mobility was restricted. There was moderate   stenosis. Valve area (VTI): 1.46 cm^2. Valve area (Vmax): 1.42   cm^2. Valve area (Vmean): 1.42 cm^2. - Mitral valve: Calcified annulus. There was mild regurgitation   directed centrally. - Left atrium: The atrium was moderately dilated.  Patient Profile     67 y.o. female with a history of HTN, ASCAD with CTO of the RCA and high grade LCx/OM stenosis s/p DES x 2 (10/16), hyperlipidemia, Type II DM,hyperthyroidism onmethimazole, atrial fibrillations/p multiple DCCV in the pastwho presented withAFRVRand chest  pain. Patient converted to sinus rhythm in the emergency room with amiodarone.  She then developed a GI bleed.  Assessment & Plan    1. Paroxysmal atrial fibrillation Pt in sinus rhythm on telemetry on increased dose of sotalol. EKG today with QTc 480 ms.  Holding eliquis for at least 5 days, per GI.  2. GI bleed GI following; presumed diverticular bleed. Hb 9.3 (10.1). Per GI, wait at least 5 days before resuming elquis. Will obtain a repeat CBC at lunchtime. If stable, will consider discharge.  3. CAD Pt has had prior PCI of circumflex/obtuse marginal in October 2016.  I do not think she will tolerate both apixaban and Plavix long-term.  Plavix DCed. Continue statin.  4. HLD Continue statin  5. Mildly elevated troponin Do not suspect ACS process, no ischemic workup at this time  6. Moderate aortic stenosis Follow up echo to follow progression  7. HTN Pressures controlled yesterday. Continue regimen of norvasc and toprol.  Pt is concerned about being weak. Asked nursing to walk patient 2 times this morning to make sure she remains asymptomatic.  `   For questions or updates, please contact [place] Please consult [email or web address] for contact info under Cardiology/STEMI.      Signed, Tami Lin Duke, PA  08/10/2017, 8:42 AM   As above, patient seen and examined.  She denies dyspnea or chest pain.  No hematochezia.  Hemoglobin is stable.  She remains in sinus rhythm.  We will plan to discharge today on present medications.  Her sotalol dose has been increased to 120 mg twice daily.  Her QT by my measurement today is 0.44.  We will plan to hold apixaban for 1 week.  If she has no further bleeding she can resume at that time.  Note we have discontinued Plavix.  We will arrange transition of care appointment in 1 week and then follow-up with Dr. Radford Pax in 12 weeks.  Kirk Ruths, MD

## 2017-08-10 NOTE — Progress Notes (Signed)
Discharge instructions (including medications) discussed with and copy provided to patient/caregiver 

## 2017-08-10 NOTE — Discharge Summary (Signed)
Discharge Summary    Patient ID: Judith [name],  MRN: [id number], DOB/AGE: November 19, 1950 67 y.o.  Admit date: 08/05/2017 Discharge date: 08/10/2017  Primary Care Provider: [name] Primary Cardiologist: Fransico Him, MD  Discharge Diagnoses    Principal Problem:   LGI bleed Active Problems:   DM2 (diabetes mellitus, type 2) ([place])   HTN (hypertension)   Tobacco user   GERD (gastroesophageal reflux disease)   Coronary artery disease with stable angina pectoris ([place])   Hyperthyroidism   Atrial fibrillation (HCC)   A-fib (HCC)   Acute blood loss anemia   Allergies Allergies  Allergen Reactions  . Contrast Media [Iodinated Diagnostic Agents] Hives    Spoke to patient, Iodine allergy is really IV contrast allergy.   . Dilaudid [Hydromorphone Hcl] Other (See Comments)    HEADACHE  . Novolog [Insulin Aspart] Shortness Of Breath    "breathing problems"  . Codeine Nausea And Vomiting    HIGH DOSES-SEVERE VOMITING  . Iodine Other (See Comments)    MUST HAVE BENADRYL PRIOR TO PROCEDURE AND RIGHT BEFORE TREATMENT TO COUNTERACT REACTION-BLISTERING REACTION DERMATOLOGICAL  . Penicillins Itching, Rash and Other (See Comments)    Has patient had a PCN reaction causing immediate rash, facial/tongue/throat swelling, SOB or lightheadedness with hypotension: Yes Has patient had a PCN reaction causing severe rash involving mucus membranes or skin necrosis: No Has patient had a PCN reaction that required hospitalization No Has patient had a PCN reaction occurring within the last 10 years: No If all of the above answers are "NO", then may proceed with Cephalosporin use.  CHEST SIZED RASH AND ITCHING   . Propofol Other (See Comments)    "Breathing problems - asthma attack" Can take with benadryl   . Ace Inhibitors Cough  . Demerol [Meperidine] Nausea And Vomiting  . Neosporin [Neomycin-Bacitracin Zn-Polymyx] Itching and Rash    MAKES REACTIONS WORSE WHEN USING AS PROPHYLACTIC  .  Percocet [Oxycodone-Acetaminophen] Rash  . Tape Itching and Rash    Diagnostic Studies/Procedures    Echo 07/05/17: Study Conclusions - Left ventricle: The cavity size was normal. There was mild   concentric hypertrophy. Systolic function was normal. The   estimated ejection fraction was in the range of 55% to 60%. Wall   motion was normal; there were no regional wall motion   abnormalities. - Aortic valve: Valve mobility was restricted. There was moderate   stenosis. Valve area (VTI): 1.46 cm^2. Valve area (Vmax): 1.42   cm^2. Valve area (Vmean): 1.42 cm^2. - Mitral valve: Calcified annulus. There was mild regurgitation   directed centrally. - Left atrium: The atrium was moderately dilated.    Cardiac catheterization 03/06/2015 Successful bifurcation stenting of the left circumflex/obtuse marginal bifurcation using drug-eluting stent platforms Prox LAD to Mid LAD lesion, 40% stenosed. calcified diffuse. Prox RCA lesion, 100% stenosed. calcified diffuse.   History of Present Illness     Judith [name] is a 67 y.o. female with a history of HTN, ASCAD with CTO of the RCA and high grade LCx/OM stenosis s/p DES x 2, hyperlipidemia, Type II DM, hyperthyroidism on methimazole, atrial fibrillations/p multiple DCCV in the past who presented with AFRVR.  The patient has multiple cardiac comorbidities for which she follows with Dr. Radford Pax. She has AF for which she has required multiple DCCV and is treated with sotalol. She was seen in cardiology clinic on month ago for follow up after hospitalization for AFRVR with WCT thought to be aberrant AF; she was managed with  a diltiazem gtt and converted to NSR. At that visit she was doing well without an complaints.   She was brought to the hospital this evening after an episode of CP and palptiations. EMS obtained ECG that showed AFRVR with ST segment deviation concerning for possible STEMI. Code STEMI was called but then cancelled prior to arrival  to the ED. In the ED the patient was in [facility] with some wide complex rhythm. She was given IV amiodarone and converted to NSR. Subsequent ECG showed NSR with QTc 466, poor R wave progression, nonspecific ST changes. Labs are notable for K 3.6, Mg 1.2. Troponin negative x1.   On my evaluation, the patient is resting comfortably in bed. She states that she was sitting at home watching TV when she suddenly developed onset of palpitations. She states that this episode was similar to, but more severe than, her prior episodes of AF. She denies other concomitant chest pain or any associated symptoms including dyspnea. She tried to take an extra half dose of her metoprolol without improvement of her symptoms. She states that her symptoms have completely resolved since being in NSR. She is unaware of what may have triggered this episode and denies mediation noncompliance or alcohol use. She states that she was recently treated for bronchitis and finished a course of prednisone, although she is frequently using her nebulizer.   Hospital Course     Consultants: GI  Lower GI bleed Patient was admitted to cardiology and found to have a lower GI bleed - presumed to be diverticular bleed. Hb dropped to 7.7 and she received 1U PRBC. Hb improved: 9.3 -->  10.2 --> 9.3. She denies further bleeding and is ready to discharge home.   Paroxysmal atrial fibrillation On sotalol and eliquis. On presentation to the ED, she was in Afib RVR. Amiodarone initiated in the ED with conversion to NSR. Her sotalol was increased to 120 mg BID. QT WNL (0.44 per Dr. Stanford Breed).  continue toprol. Will hold eliquis for one week, per GI. We will restart eliquis after seen by [place] in cardiology clinic.   CAD Given her GI bleed, we will stop plavix as she is also on eliquis. No ASA. She denies anginal symptoms. Continue lipitor. She has a mildly elevated troponin. We do not suspect ACS process, no ischemic workup unless symptoms change.    HTN Added norvasc for better pressure control.  HLD Continue statin  Low Magnesium Discharged on supplemental MagOx. Please check Mg at OP follow up.  _____________  Discharge Vitals Blood pressure (!) 152/61, pulse 63, temperature 97.8 F (36.6 C), temperature source Oral, resp. rate 18, height 5\' 4"  (1.626 m), weight 173 lb 9.6 oz (78.7 kg), SpO2 99 %.  Filed Weights   08/07/17 0511 08/09/17 0421 08/10/17 0530  Weight: 182 lb 4.8 oz (82.7 kg) 173 lb (78.5 kg) 173 lb 9.6 oz (78.7 kg)    Labs & Radiologic Studies    CBC Recent Labs    08/09/17 0439 08/10/17 0436  WBC 9.0 8.0  HGB 10.2* 9.3*  HCT 31.0* 29.5*  MCV 87.6 87.8  PLT 334 010   Basic Metabolic Panel Recent Labs    08/09/17 0439  NA 140  K 4.3  CL 105  CO2 26  GLUCOSE 144*  BUN 11  CREATININE 0.76  CALCIUM 9.1   Liver Function Tests No results for input(s): AST, ALT, ALKPHOS, BILITOT, PROT, ALBUMIN in the last 72 hours. No results for input(s): LIPASE, AMYLASE in the last  72 hours. Cardiac Enzymes No results for input(s): CKTOTAL, CKMB, CKMBINDEX, TROPONINI in the last 72 hours. BNP Invalid input(s): POCBNP D-Dimer No results for input(s): DDIMER in the last 72 hours. Hemoglobin A1C No results for input(s): HGBA1C in the last 72 hours. Fasting Lipid Panel No results for input(s): CHOL, HDL, LDLCALC, TRIG, CHOLHDL, LDLDIRECT in the last 72 hours. Thyroid Function Tests No results for input(s): TSH, T4TOTAL, T3FREE, THYROIDAB in the last 72 hours.  Invalid input(s): FREET3 _____________  Ct Abdomen Pelvis Wo Contrast  Result Date: 08/08/2017 CLINICAL DATA:  Left lower quadrant abdominal pain/tenderness and hematochezia. EXAM: CT ABDOMEN AND PELVIS WITHOUT CONTRAST TECHNIQUE: Multidetector CT imaging of the abdomen and pelvis was performed following the standard protocol without IV contrast. COMPARISON:  05/10/2016 FINDINGS: Lower chest: The lung bases are clear of acute process. No pleural  effusion or pulmonary lesions. The heart is normal in size. No pericardial effusion. Coronary artery and mitral valve annular calcifications are noted. The distal esophagus and aorta are unremarkable. Hepatobiliary: No focal hepatic lesions or intrahepatic biliary dilatation. Small layering gallstones are noted the gallbladder but no findings for acute cholecystitis. Normal caliber common bile duct. Pancreas: Significant pancreatic atrophy but no mass, inflammation or ductal dilatation. Duodenum diverticulum noted near the pancreatic head. Spleen: Normal size.  No focal lesions. Adrenals/Urinary Tract: The adrenal glands are normal. No renal, ureteral or bladder calculi or mass. Stomach/Bowel: The stomach, duodenum, small bowel and colon are unremarkable. No acute inflammatory changes, mass lesions or obstructive findings. Moderate descending and sigmoid colon diverticulosis but no findings for acute diverticulitis. The terminal ileum and appendix are normal. Vascular/Lymphatic: Advanced atherosclerotic calcifications involving the aorta and branch vessels but no focal aneurysm. Small scattered mesenteric and retroperitoneal lymph nodes but no mass or adenopathy. Reproductive: The uterus is surgically absent. Both ovaries are still present and appear normal. Other: No pelvic mass or adenopathy. No free pelvic fluid collections. No inguinal mass or adenopathy. No abdominal wall hernia or subcutaneous lesions. Musculoskeletal: No significant bony findings. IMPRESSION: 1. No acute abdominal/pelvic findings, mass lesions or adenopathy. 2. Colonic diverticulosis without findings for acute diverticulitis. 3. Cholelithiasis but no CT findings for acute cholecystitis. 4. Age advanced vascular calcifications but no aneurysm. Electronically Signed   By: Marijo Sanes M.D.   On: 08/08/2017 08:38   Dg Chest 2 View  Result Date: 07/30/2017 CLINICAL DATA:  Cough, chest tightness and shortness of breath. EXAM: CHEST - 2 VIEW  COMPARISON:  10/01/2017. FINDINGS: Normal sized heart. Clear lungs. Diffuse peribronchial thickening. Mild scoliosis and mild thoracic spine degenerative changes. IMPRESSION: Moderate bronchitic changes. Electronically Signed   By: Claudie Revering M.D.   On: 07/30/2017 10:07   Disposition   Pt is being discharged home today in good condition.  Follow-up Plans & Appointments    Follow-up Information    Sueanne Margarita, MD Follow up on 08/17/2017.   Specialty:  Cardiology Why:  Please arrive 15 minutes early for your 3pm appointment Contact information: [address] [place] [address] [phone]          Discharge Instructions    Diet - low sodium heart healthy   Complete by:  As directed    Diet - low sodium heart healthy   Complete by:  As directed    Discharge instructions   Complete by:  As directed    Do NOT restart eliquis until she is seen by [place] in the cardiology office for follow up.  Increase activity slowly   Complete by:  As directed    Increase activity slowly   Complete by:  As directed       Discharge Medications   Allergies as of 08/10/2017      Reactions   Contrast Media [iodinated Diagnostic Agents] Hives   Spoke to patient, Iodine allergy is really IV contrast allergy.    Dilaudid [hydromorphone Hcl] Other (See Comments)   HEADACHE   Novolog [insulin Aspart] Shortness Of Breath   "breathing problems"   Codeine Nausea And Vomiting   HIGH DOSES-SEVERE VOMITING   Iodine Other (See Comments)   MUST HAVE BENADRYL PRIOR TO PROCEDURE AND RIGHT BEFORE TREATMENT TO COUNTERACT REACTION-BLISTERING REACTION DERMATOLOGICAL   Penicillins Itching, Rash, Other (See Comments)   Has patient had a PCN reaction causing immediate rash, facial/tongue/throat swelling, SOB or lightheadedness with hypotension: Yes Has patient had a PCN reaction causing severe rash involving mucus membranes or skin necrosis: No Has patient had a PCN reaction that required  hospitalization No Has patient had a PCN reaction occurring within the last 10 years: No If all of the above answers are "NO", then may proceed with Cephalosporin use. CHEST SIZED RASH AND ITCHING   Propofol Other (See Comments)   "Breathing problems - asthma attack" Can take with benadryl   Ace Inhibitors Cough   Demerol [meperidine] Nausea And Vomiting   Neosporin [neomycin-bacitracin Zn-polymyx] Itching, Rash   MAKES REACTIONS WORSE WHEN USING AS PROPHYLACTIC   Percocet [oxycodone-acetaminophen] Rash   Tape Itching, Rash      Medication List    STOP taking these medications   clopidogrel 75 MG tablet Commonly known as:  PLAVIX   ELIQUIS 5 MG Tabs tablet Generic drug:  apixaban   magnesium oxide 400 MG tablet Commonly known as:  MAG-OX Replaced by:  magnesium oxide 400 (241.3 Mg) MG tablet     TAKE these medications   albuterol 108 (90 Base) MCG/ACT inhaler Commonly known as:  PROVENTIL HFA Inhale 1-2 puffs into the lungs every 6 (six) hours as needed for wheezing or shortness of breath.   albuterol (2.5 MG/3ML) 0.083% nebulizer solution Commonly known as:  PROVENTIL Take 3 mLs (2.5 mg total) by nebulization every 6 (six) hours as needed for wheezing or shortness of breath.   amLODipine 10 MG tablet Commonly known as:  NORVASC Take 1 tablet (10 mg total) by mouth daily. Start taking on:  08/11/2017   atorvastatin 20 MG tablet Commonly known as:  LIPITOR Take 1 tablet (20 mg total) by mouth daily.   benzonatate 100 MG capsule Commonly known as:  TESSALON Take 1-2 capsules (100-200 mg total) by mouth 3 (three) times daily as needed. What changed:  reasons to take this   buPROPion 150 MG 12 hr tablet Commonly known as:  WELLBUTRIN SR TAKE ONE TABLET BY MOUTH TWICE DAILY   fenofibrate 145 MG tablet Commonly known as:  TRICOR Take 1 tablet (145 mg total) by mouth daily.   FLUoxetine 20 MG capsule Commonly known as:  PROZAC TAKE ONE CAPSULE BY MOUTH AT  BEDTIME   gabapentin 300 MG capsule Commonly known as:  NEURONTIN Take 3 capsules (900 mg total) by mouth 2 (two) times daily. TAKE 3 CAPSULES BY MOUTH EVERY  MORNING AND 3 CAPSULES AT BEDTIME What changed:  additional instructions   insulin degludec 100 UNIT/ML Sopn FlexTouch Pen Commonly known as:  TRESIBA FLEXTOUCH Inject 0.2 mLs (20 Units total) into the skin daily at 10 pm.  Insulin Pen Needle 32G X 4 MM Misc Use to inject insulin daily   insulin regular 100 units/mL injection Commonly known as:  NOVOLIN R RELION 3 times a day (just before each meal), 15-25-10 units What changed:    how much to take  how to take this  when to take this  additional instructions   INSULIN SYRINGE .5CC/31GX5/16" 31G X 5/16" 0.5 ML Misc Use to inject insulin   magnesium oxide 400 (241.3 Mg) MG tablet Commonly known as:  MAG-OX Take 1 tablet (400 mg total) by mouth daily. Start taking on:  08/11/2017 Replaces:  magnesium oxide 400 MG tablet   metFORMIN 500 MG 24 hr tablet Commonly known as:  GLUCOPHAGE-XR Take 4 tablets (2,000 mg total) daily with breakfast by mouth. What changed:    how much to take  when to take this   methimazole 10 MG tablet Commonly known as:  TAPAZOLE TAKE ONE TABLET BY MOUTH ONCE DAILY   metoprolol succinate 25 MG 24 hr tablet Commonly known as:  TOPROL-XL TAKE ONE TABLET BY MOUTH TWICE DAILY   nitroGLYCERIN 0.4 MG SL tablet Commonly known as:  NITROSTAT Place 1 tablet (0.4 mg total) under the tongue every 5 (five) minutes as needed for chest pain.   pantoprazole 40 MG tablet Commonly known as:  PROTONIX Take 1 tablet (40 mg total) by mouth daily.   sotalol 120 MG tablet Commonly known as:  BETAPACE Take 1 tablet (120 mg total) by mouth every 12 (twelve) hours. What changed:    medication strength  how much to take   SSD 1 % cream Generic drug:  silver sulfADIAZINE APPLY TO AFFECTED AREA ONCE DAILY   silver sulfADIAZINE 1 %  cream Commonly known as:  SILVADENE Apply 1 application topically daily.   traMADol 50 MG tablet Commonly known as:  ULTRAM Take 1 tablet (50 mg total) by mouth every 8 (eight) hours as needed. What changed:    when to take this  reasons to take this         Outstanding Labs/Studies   Repeat CBC, Mg  Restart eliquis in 1 week after cardiology follow up  Duration of Discharge Encounter   Greater than 30 minutes including physician time.  Signed, Abigail Butts NP 08/10/2017, 10:50 AM

## 2017-08-12 LAB — TYPE AND SCREEN
ABO/RH(D): O POS
Antibody Screen: NEGATIVE
Unit division: 0
Unit division: 0
Unit division: 0
Unit division: 0

## 2017-08-12 LAB — BPAM RBC
Blood Product Expiration Date: 201904072359
Blood Product Expiration Date: 201904232359
Blood Product Expiration Date: 201904232359
Blood Product Expiration Date: 201904232359
ISSUE DATE / TIME: 201903291844
ISSUE DATE / TIME: 201904010356
ISSUE DATE / TIME: 201904010947
Unit Type and Rh: 5100
Unit Type and Rh: 5100
Unit Type and Rh: 5100
Unit Type and Rh: 5100

## 2017-08-17 ENCOUNTER — Ambulatory Visit: Payer: Medicare PPO | Admitting: Cardiology

## 2017-08-17 ENCOUNTER — Other Ambulatory Visit: Payer: Self-pay | Admitting: Family Medicine

## 2017-08-17 ENCOUNTER — Encounter: Payer: Self-pay | Admitting: Cardiology

## 2017-08-17 VITALS — BP 134/70 | HR 81 | Ht 64.0 in | Wt 180.0 lb

## 2017-08-17 DIAGNOSIS — I10 Essential (primary) hypertension: Secondary | ICD-10-CM | POA: Diagnosis not present

## 2017-08-17 DIAGNOSIS — E785 Hyperlipidemia, unspecified: Secondary | ICD-10-CM

## 2017-08-17 DIAGNOSIS — I350 Nonrheumatic aortic (valve) stenosis: Secondary | ICD-10-CM

## 2017-08-17 DIAGNOSIS — I481 Persistent atrial fibrillation: Secondary | ICD-10-CM | POA: Diagnosis not present

## 2017-08-17 DIAGNOSIS — I4819 Other persistent atrial fibrillation: Secondary | ICD-10-CM

## 2017-08-17 DIAGNOSIS — I25118 Atherosclerotic heart disease of native coronary artery with other forms of angina pectoris: Secondary | ICD-10-CM

## 2017-08-17 MED ORDER — APIXABAN 5 MG PO TABS: 5.0000 mg | ORAL_TABLET | Freq: Two times a day (BID) | ORAL | 3 refills | Status: DC

## 2017-08-17 NOTE — Telephone Encounter (Signed)
Refill request Tramadol  LOV 08/05/2016  Pharmacy on file

## 2017-08-17 NOTE — Progress Notes (Signed)
Cardiology Office Note:    Date:  08/17/2017   ID:  Judith [name], DOB 09-Dec-1950, MRN [id number]  PCP:  [name], MD  Cardiologist:  Fransico Him, MD    Referring MD: [name], MD   Chief Complaint  Patient presents with  . Atrial Fibrillation  . Coronary Artery Disease  . Hypertension  . Hyperlipidemia    History of Present Illness:    Judith [name] is a 67 y.o. female with a hx of HTN, ASCAD with CTO of the RCA and high grade LCx/OM stenosis s/p DES x 2 (10/16), hyperlipidemia, Type II DM,hyperthyroidism onmethimazole, atrial fibrillations/p multiple DCCV in the pastwho presented recently to ER withAFRVRand chest pain. Patient converted to sinus rhythm in the emergency room with amiodarone. She then developed a GI bleed.  Her sotalol was increased to 120 mg twice daily with a stable QTC.  Her Eliquis was held for 5 days per recommendations by GI.  It was secondary to diverticulosis.  Her Plavix was discontinued as well.  She did have a mildly elevated troponin with flat trend which was felt to be secondary to her GI bleed and A. fib with RVR.  She is here today for followup and is doing well.  She denies any chest pain or pressure, SOB, DOE, PND, orthopnea, LE edema, dizziness, palpitations or syncope. She is compliant with her meds and is tolerating meds with no SE. she has not had any more problems with bleeding from her rectum.  Past Medical History:  Diagnosis Date  . Abnormal EKG 07/31/2013  . Arthritis    "hands" (03/06/2015)  . Asthma   . Carpal tunnel syndrome, bilateral   . Chronic kidney disease   . Complication of anesthesia    slow to wake up  . Coronary artery disease     2 v CAD with CTO of the RCA and high grade bifurcational LCx/OM stenosis. S/P PCI DES x 2 to the LCx/OM.  Marland Kitchen Diabetic peripheral neuropathy ([place]) "since 1996"  . GERD (gastroesophageal reflux disease)   . Goiter   . Headache    migraines prior to menopause  . History of  shingles 06/01/2013  . Hyperlipidemia LDL goal <70 10/13/2015  . Hypertension   . Hyperthyroidism   . PAF (paroxysmal atrial fibrillation) ([place]) 04/29/2015   CHADS2VASC score of 4 now on Apixaban  . Pneumonia ~ 1976  . Tremors of nervous system   . Type II diabetes mellitus (HCC)    insulin dependent    Past Surgical History:  Procedure Laterality Date  . ABDOMINAL HYSTERECTOMY  1988   age 38; CERVICAL DYSPLASIA; ovaries intact.   . AMPUTATION Right 01/23/2016   Procedure: Right 3rd Ray Amputation;  Surgeon: [name], MD;  Location: [facility];  Service: Orthopedics;  Laterality: Right;  . AMPUTATION Right 02/13/2016   Procedure: Right Transmetatarsal Amputation;  Surgeon: [name], MD;  Location: [facility];  Service: Orthopedics;  Laterality: Right;  . CARDIAC CATHETERIZATION N/A 02/27/2015   Procedure: Left Heart Cath and Coronary Angiography;  Surgeon: [name], MD; LAD 40%, mCFX 80%, OM 70%, RCA 100% calcified       . CARDIAC CATHETERIZATION N/A 03/06/2015   Procedure: Coronary Stent Intervention;  Surgeon: [name], MD;  Location: [facility];  Service: Cardiovascular;  Laterality: N/A;  Mid CX 3.50x12 promus DES w/ 0% resdual and Prox OM1 2.50x20 promus DES w/ 20% residual  . CARDIOVERSION    . CARPAL TUNNEL RELEASE  Right Nov 2015  . CARPAL TUNNEL RELEASE Right 1992; 05/2014   [name]; [place]  . CESAREAN SECTION  1982; 1984  . FOOT NEUROMA SURGERY Bilateral 2000  . KNEE ARTHROSCOPY Right ~ 2003   "meniscus repair"  . SHOULDER OPEN ROTATOR CUFF REPAIR Right 1996; 1998   "w/fracture repair"  . THYROID SURGERY  2000   "removed lots of nodules"  . TONSILLECTOMY  1976    Current Medications: Current Meds  Medication Sig  . albuterol (PROVENTIL HFA) 108 (90 Base) MCG/ACT inhaler Inhale 1-2 puffs into the lungs every 6 (six) hours as needed for wheezing or shortness of breath.  Marland Kitchen albuterol (PROVENTIL) (2.5 MG/3ML) 0.083% nebulizer solution Take 3 mLs (2.5  mg total) by nebulization every 6 (six) hours as needed for wheezing or shortness of breath.  Marland Kitchen amLODipine (NORVASC) 10 MG tablet Take 1 tablet (10 mg total) by mouth daily.  Marland Kitchen atorvastatin (LIPITOR) 20 MG tablet Take 1 tablet (20 mg total) by mouth daily.  . benzonatate (TESSALON) 100 MG capsule Take 1-2 capsules (100-200 mg total) by mouth 3 (three) times daily as needed. (Patient taking differently: Take 100-200 mg by mouth 3 (three) times daily as needed for cough. )  . buPROPion (WELLBUTRIN SR) 150 MG 12 hr tablet TAKE ONE TABLET BY MOUTH TWICE DAILY  . fenofibrate (TRICOR) 145 MG tablet Take 1 tablet (145 mg total) by mouth daily.  Marland Kitchen FLUoxetine (PROZAC) 20 MG capsule TAKE ONE CAPSULE BY MOUTH AT BEDTIME  . gabapentin (NEURONTIN) 300 MG capsule Take 3 capsules (900 mg total) by mouth 2 (two) times daily. TAKE 3 CAPSULES BY MOUTH EVERY  MORNING AND 3 CAPSULES AT BEDTIME  . insulin degludec (TRESIBA FLEXTOUCH) 100 UNIT/ML SOPN FlexTouch Pen Inject 0.2 mLs (20 Units total) into the skin daily at 10 pm.  . Insulin Pen Needle 32G X 4 MM MISC Use to inject insulin daily  . insulin regular (NOVOLIN R RELION) 100 units/mL injection 3 times a day (just before each meal), 15-25-10 units (Patient taking differently: Inject 10-20 Units into the skin 3 (three) times daily. 3 times a day (just before each meal), 15-20-10 units)  . Insulin Syringe-Needle U-100 (INSULIN SYRINGE .5CC/31GX5/16") 31G X 5/16" 0.5 ML MISC Use to inject insulin  . magnesium oxide (MAG-OX) 400 (241.3 Mg) MG tablet Take 1 tablet (400 mg total) by mouth daily.  . metFORMIN (GLUCOPHAGE-XR) 500 MG 24 hr tablet Take 4 tablets (2,000 mg total) daily with breakfast by mouth. (Patient taking differently: Take 1,000 mg by mouth 2 (two) times daily. )  . methimazole (TAPAZOLE) 10 MG tablet TAKE ONE TABLET BY MOUTH ONCE DAILY  . metoprolol succinate (TOPROL-XL) 25 MG 24 hr tablet TAKE ONE TABLET BY MOUTH TWICE DAILY  . nitroGLYCERIN (NITROSTAT)  0.4 MG SL tablet Place 1 tablet (0.4 mg total) under the tongue every 5 (five) minutes as needed for chest pain.  . pantoprazole (PROTONIX) 40 MG tablet Take 1 tablet (40 mg total) by mouth daily.  . silver sulfADIAZINE (SILVADENE) 1 % cream Apply 1 application topically daily.  . sotalol (BETAPACE) 120 MG tablet Take 1 tablet (120 mg total) by mouth every 12 (twelve) hours.  . SSD 1 % cream APPLY TO AFFECTED AREA ONCE DAILY  . traMADol (ULTRAM) 50 MG tablet Take 1 tablet (50 mg total) by mouth every 8 (eight) hours as needed. (Patient taking differently: Take 50 mg by mouth every 6 (six) hours as needed for moderate pain. )  Allergies:   Contrast media [iodinated diagnostic agents]; Dilaudid [hydromorphone hcl]; Novolog [insulin aspart]; Codeine; Iodine; Penicillins; Propofol; Ace inhibitors; Demerol [meperidine]; Neosporin [neomycin-bacitracin zn-polymyx]; Percocet [oxycodone-acetaminophen]; and Tape   Social History   Socioeconomic History  . Marital status: Single    Spouse name: Not on file  . Number of children: 2  . Years of education: Masters  . Highest education level: Not on file  Occupational History  . Occupation: Landscape architect  Social Needs  . Financial resource strain: Somewhat hard  . Food insecurity:    Worry: Sometimes true    Inability: Sometimes true  . Transportation needs:    Medical: No    Non-medical: No  Tobacco Use  . Smoking status: Former Smoker    Packs/day: 0.00    Years: 41.00    Pack years: 0.00    Types: Cigarettes    Last attempt to quit: 03/05/2015    Years since quitting: 2.4  . Smokeless tobacco: Never Used  . Tobacco comment: 04/29/2015 "quit smoking cigarettes 02/27/2015"  Substance and Sexual Activity  . Alcohol use: No  . Drug use: No  . Sexual activity: Not Currently    Birth control/protection: Post-menopausal, Surgical  Lifestyle  . Physical activity:    Days per week: 0 days    Minutes per session: 0 min  . Stress:  Very much  Relationships  . Social connections:    Talks on phone: More than three times a week    Gets together: More than three times a week    Attends religious service: More than 4 times per year    Active member of club or organization: No    Attends meetings of clubs or organizations: Never    Relationship status: Never married  Other Topics Concern  . 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      Lives with sister in [place].      Tobacco: 1 ppd x 41 years - quit 2016      Alcohol: none      Drugs: none      Exercise:  Walking in neighborhood; physical job.   Right-handed.   2 cups caffeine daily.     Family History: The patient's family history includes Allergies in her sister; Breast cancer in her maternal grandmother and mother; COPD in her father; Cancer (age of onset: 42) in her mother; Emphysema in her maternal grandfather and paternal grandfather; Heart attack in her father; Heart disease (age of onset: 38) in her father; Hypertension in her father; Leukemia in her paternal grandmother; Lung cancer in her mother; Parkinson's disease in her father. There is no history of Thyroid disease.  ROS:   Please see the history of present illness.    ROS  All other systems reviewed and negative.   EKGs/Labs/Other Studies Reviewed:    The following studies were reviewed today: Hospital notes  EKG:  EKG is not ordered today.    Recent Labs: 07/22/2017: ALT 11 08/06/2017: Magnesium 2.2; TSH 0.977 08/09/2017: BUN 11; Creatinine, Ser 0.76; Potassium 4.3; Sodium 140 08/10/2017: Hemoglobin 9.3; Platelets 382   Recent Lipid Panel    Component Value Date/Time   CHOL 236 (H) 07/22/2017 0857   TRIG 544 (H) 07/22/2017 4270  HDL 30 (L) 07/22/2017 0857    CHOLHDL 7.9 (H) 07/22/2017 0857   CHOLHDL 5.8 (H) 02/05/2016 1047   VLDL 76 (H) 02/05/2016 1047   LDLCALC Comment 07/22/2017 0857   LDLDIRECT 97 01/06/2017 0904    Physical Exam:    VS:  BP 134/70   Pulse 81   Ht 5\' 4"  (1.626 m)   Wt 180 lb (81.6 kg)   SpO2 97%   BMI 30.90 kg/m     Wt Readings from Last 3 Encounters:  08/17/17 180 lb (81.6 kg)  08/10/17 173 lb 9.6 oz (78.7 kg)  08/05/17 179 lb (81.2 kg)     GEN:  Well nourished, well developed in no acute distress HEENT: Normal NECK: No JVD; No carotid bruits LYMPHATICS: No lymphadenopathy CARDIAC: RRR, no murmurs, rubs, gallops RESPIRATORY:  Clear to auscultation without rales, wheezing or rhonchi  ABDOMEN: Soft, non-tender, non-distended MUSCULOSKELETAL:  No edema; No deformity  SKIN: Warm and dry NEUROLOGIC:  Alert and oriented x 3 PSYCHIATRIC:  Normal affect   ASSESSMENT:    1. Coronary artery disease of native artery of native heart with stable angina pectoris ([place])   2. Essential hypertension   3. Persistent atrial fibrillation ([place])   4. Hyperlipidemia LDL goal <70   5. Aortic valve stenosis, etiology of cardiac valve disease unspecified    PLAN:    In order of problems listed above:  1.  ASCAD - cath showed CTO of the RCA and high grade LCx/OM stenosis s/p DES x 2 (10/16).  He had some mild chest pain in the setting of A. fib with RVR which resolved immediately upon conversion to sinus rhythm.  She has not had any further episodes of chest pain.  Troponin is minimally elevated with flat trend.  Would not pursue any further ischemic workup at this time.  She will continue on ASA 81 mg daily, statin and beta-blocker.  Plavix was discontinued after her GI bleed.  2.  Hypertension -her blood pressure is controlled on exam today.  She will continue on amlodipine 10 mg daily and Toprol XL 25 mg twice daily.    3.  Persistent atrial fibrillation -she is maintaining normal sinus rhythm on exam today.  She will  continue on sotalol 120 mg twice daily and I am going to restart her Eliquis 5 mg twice daily.  The I had recommended restarting Eliquis 5 days after holding in hospital.  He has not restarted this yet..  4.  Hyperlipidemia with LDL goal < 70.  He will continue on atorvastatin 20 mg daily and TriCor 145 mg daily.  Her LDL was 97 direct measurement on 01/06/2017.  Her last lipid panel done 07/22/2017 showed triglycerides of 545 LDL could not be calculated HDL 30.  I am going to refer her to lipid clinic.  5.  Moderate aortic stenosis by 2D echocardiogram done on 07/05/2017.  She is a symptom Runner, broadcasting/film/video.  I will repeat echo next February 2020.   Medication Adjustments/Labs and Tests Ordered: Current medicines are reviewed at length with the patient today.  Concerns regarding medicines are outlined above.  Orders Placed This Encounter  Procedures  . ECHOCARDIOGRAM COMPLETE   Meds ordered this encounter  Medications  . apixaban (ELIQUIS) 5 MG TABS tablet    Sig: Take 1 tablet (5 mg total) by mouth 2 (two) times daily.    Dispense:  180 tablet    Refill:  3    Signed, Fransico Him, MD  08/17/2017 3:48 PM  [facility]

## 2017-08-17 NOTE — Patient Instructions (Addendum)
Medication Instructions:  Your physician has recommended you make the following change in your medication:  START: eliquis 5 mg two times a day   If you need a refill on your cardiac medications, please contact your pharmacy first.  Labwork: None ordered   Testing/Procedures: Your physician has requested that you have an echocardiogram in February 2020.  Echocardiography is a painless test that uses sound waves to create images of your heart. It provides your doctor with information about the size and shape of your heart and how well your heart's chambers and valves are working. This procedure takes approximately one hour. There are no restrictions for this procedure.   Follow-Up: Your physician wants you to follow-up in: 6 months with Dr. Radford Pax. You will receive a reminder letter in the mail two months in advance. If you don't receive a letter, please call our office to schedule the follow-up appointment.  Any Other Special Instructions Will Be Listed Below (If Applicable).   Thank you for choosing [facility], RN  [phone]  If you need a refill on your cardiac medications before your next appointment, please call your pharmacy.

## 2017-08-18 ENCOUNTER — Ambulatory Visit (INDEPENDENT_AMBULATORY_CARE_PROVIDER_SITE_OTHER): Payer: Medicare PPO | Admitting: Podiatry

## 2017-08-18 ENCOUNTER — Encounter: Payer: Self-pay | Admitting: Podiatry

## 2017-08-18 DIAGNOSIS — L84 Corns and callosities: Secondary | ICD-10-CM

## 2017-08-18 DIAGNOSIS — E1149 Type 2 diabetes mellitus with other diabetic neurological complication: Secondary | ICD-10-CM | POA: Diagnosis not present

## 2017-08-18 NOTE — Progress Notes (Signed)
Subjective: Judith [name] presents the office today for follow-up evaluation of a wound to the left big toe.  She has been continue with Silvadene dressing changes daily.  She feels that the wound is healed and she is doing great.  Denies any drainage or pus or any redness or swelling.  She has no new concerns today.  Denies any systemic complaints such as fevers, chills, nausea, vomiting. No acute changes since last appointment, and no other complaints at this time.   Objective: AAO x3, NAD DP/PT pulses palpable bilaterally, CRT less than 3 seconds On the distal medial aspect of the left hallux continue to be a skin fissure with evidence of dried blood.  After debridement there is no underlying ulceration was able to debride the dried blood today.  There is no drainage or pus there is no swelling, redness or any ascending cellulitis.  No fluctuation or crepitation.  There is no malodor.  No other open lesions or pre-ulcerative lesions are identified today.  Wound appears to be healed. No open lesions or pre-ulcerative lesions.  No pain with calf compression, swelling, warmth, erythema  Assessment: Skin fissure, ulceration left hallux which is healed  Plan: -All treatment options discussed with the patient including all alternatives, risks, complications.  -I debrided the hyperkeratotic tissue so that any complications or bleeding..  The underlying ulceration is healed.  Recommend moisturizer to the area daily.  Monitor for any recurrence. -Follow-up in 3 months or sooner if any issues are to arise.  Call any questions or concerns.  Trula Slade DPM

## 2017-08-19 ENCOUNTER — Other Ambulatory Visit: Payer: Self-pay

## 2017-08-19 DIAGNOSIS — E785 Hyperlipidemia, unspecified: Secondary | ICD-10-CM

## 2017-08-19 DIAGNOSIS — I2510 Atherosclerotic heart disease of native coronary artery without angina pectoris: Secondary | ICD-10-CM

## 2017-08-19 MED ORDER — FENOFIBRATE 145 MG PO TABS: 145.0000 mg | ORAL_TABLET | Freq: Every day | ORAL | 3 refills | Status: DC

## 2017-08-19 MED ORDER — AMLODIPINE BESYLATE 10 MG PO TABS: 10.0000 mg | ORAL_TABLET | Freq: Every day | ORAL | 3 refills | Status: DC

## 2017-08-19 MED ORDER — ATORVASTATIN CALCIUM 20 MG PO TABS: 20.0000 mg | ORAL_TABLET | Freq: Every day | ORAL | 3 refills | Status: DC

## 2017-08-22 ENCOUNTER — Other Ambulatory Visit: Payer: Self-pay | Admitting: Cardiology

## 2017-08-22 DIAGNOSIS — I2510 Atherosclerotic heart disease of native coronary artery without angina pectoris: Secondary | ICD-10-CM

## 2017-08-22 MED ORDER — FENOFIBRATE 145 MG PO TABS: 145.0000 mg | ORAL_TABLET | Freq: Every day | ORAL | 3 refills | Status: DC

## 2017-08-22 MED ORDER — SOTALOL HCL 120 MG PO TABS: 120.0000 mg | ORAL_TABLET | Freq: Two times a day (BID) | ORAL | 3 refills | Status: DC

## 2017-08-22 MED ORDER — METOPROLOL SUCCINATE ER 25 MG PO TB24: 25.0000 mg | ORAL_TABLET | Freq: Two times a day (BID) | ORAL | 3 refills | Status: DC

## 2017-08-22 MED ORDER — AMLODIPINE BESYLATE 10 MG PO TABS: 10.0000 mg | ORAL_TABLET | Freq: Every day | ORAL | 3 refills | Status: DC

## 2017-08-22 MED ORDER — ATORVASTATIN CALCIUM 20 MG PO TABS: 20.0000 mg | ORAL_TABLET | Freq: Every day | ORAL | 3 refills | Status: DC

## 2017-08-22 NOTE — Addendum Note (Signed)
Addended by: Derl Barrow on: 08/22/2017 11:29 AM   Modules accepted: Orders

## 2017-08-22 NOTE — Telephone Encounter (Signed)
Pt's medications were resent to mail order pharmacy, [facility], as requested. Confirmation received.

## 2017-08-25 ENCOUNTER — Other Ambulatory Visit: Payer: Self-pay

## 2017-08-25 MED ORDER — METFORMIN HCL ER 500 MG PO TB24: 2000.0000 mg | ORAL_TABLET | Freq: Every day | ORAL | 3 refills | Status: DC

## 2017-08-25 MED ORDER — METHIMAZOLE 10 MG PO TABS: 10.0000 mg | ORAL_TABLET | Freq: Every day | ORAL | 11 refills | Status: DC

## 2017-08-25 MED ORDER — TRUE METRIX LEVEL 1 LOW VI SOLN: 1.0000 | 0 refills | Status: DC | PRN

## 2017-08-25 MED ORDER — TRUE METRIX AIR GLUCOSE METER W/DEVICE KIT: 1.0000 | PACK | Freq: Every day | 0 refills | Status: DC

## 2017-08-25 MED ORDER — GLUCOSE BLOOD VI STRP: ORAL_STRIP | 12 refills | Status: DC

## 2017-08-29 ENCOUNTER — Other Ambulatory Visit: Payer: Self-pay

## 2017-08-29 ENCOUNTER — Other Ambulatory Visit: Payer: Self-pay | Admitting: *Deleted

## 2017-08-29 MED ORDER — TRUEPLUS LANCETS 28G MISC: 1.0000 | Freq: Two times a day (BID) | 2 refills | Status: DC

## 2017-08-29 MED ORDER — BD SWAB SINGLE USE REGULAR PADS: 1.0000 | MEDICATED_PAD | Freq: Two times a day (BID) | 2 refills | Status: DC

## 2017-08-29 NOTE — Telephone Encounter (Signed)
Patient requesting Tramadol refill. Please advise, thank you.

## 2017-08-31 ENCOUNTER — Ambulatory Visit: Payer: Medicare PPO | Admitting: Physician Assistant

## 2017-08-31 ENCOUNTER — Encounter: Payer: Self-pay | Admitting: Physician Assistant

## 2017-08-31 ENCOUNTER — Telehealth: Payer: Self-pay | Admitting: Physician Assistant

## 2017-08-31 VITALS — BP 128/80 | HR 79 | Temp 98.2°F | Resp 17 | Ht 64.5 in | Wt 179.0 lb

## 2017-08-31 DIAGNOSIS — M792 Neuralgia and neuritis, unspecified: Secondary | ICD-10-CM

## 2017-08-31 DIAGNOSIS — Z760 Encounter for issue of repeat prescription: Secondary | ICD-10-CM

## 2017-08-31 MED ORDER — TRAMADOL HCL 50 MG PO TABS: 50.0000 mg | ORAL_TABLET | Freq: Three times a day (TID) | ORAL | 5 refills | Status: DC | PRN

## 2017-08-31 MED ORDER — ALBUTEROL SULFATE (2.5 MG/3ML) 0.083% IN NEBU: 2.5000 mg | INHALATION_SOLUTION | Freq: Four times a day (QID) | RESPIRATORY_TRACT | 0 refills | Status: DC | PRN

## 2017-08-31 MED ORDER — FLUOXETINE HCL 20 MG PO CAPS: 20.0000 mg | ORAL_CAPSULE | Freq: Every day | ORAL | 2 refills | Status: DC

## 2017-08-31 MED ORDER — PANTOPRAZOLE SODIUM 40 MG PO TBEC: 40.0000 mg | DELAYED_RELEASE_TABLET | Freq: Every day | ORAL | 0 refills | Status: DC

## 2017-08-31 MED ORDER — GABAPENTIN 300 MG PO CAPS: 900.0000 mg | ORAL_CAPSULE | Freq: Two times a day (BID) | ORAL | 0 refills | Status: DC

## 2017-08-31 NOTE — Progress Notes (Signed)
08/31/2017 3:14 PM   DOB: [birth year] / MRN: [id number]  SUBJECTIVE:  Judith [name] is a 67 y.o. female presenting for chronic pain. She has an extensive health history and fortunately most of her medications are managed by cardiology, podiatry, endocrinology.   We carry her chronic pain meds, depression medication, and asthma medications. She feels well today and denies complaint. She would like refills of her tramadol today.  I have been carrying this for her in 6 month increments. Tramadol was selected for her as a primary option for pain given her history of CAD.   She is allergic to contrast media [iodinated diagnostic agents]; dilaudid [hydromorphone hcl]; novolog [insulin aspart]; codeine; iodine; penicillins; propofol; ace inhibitors; demerol [meperidine]; neosporin [neomycin-bacitracin zn-polymyx]; percocet [oxycodone-acetaminophen]; and tape.   She  has a past medical history of Abnormal EKG (07/31/2013), Arthritis, Asthma, Carpal tunnel syndrome, bilateral, Chronic kidney disease, Complication of anesthesia, Coronary artery disease, Diabetic peripheral neuropathy ([place]) ("since 1996"), GERD (gastroesophageal reflux disease), Goiter, Headache, History of shingles (06/01/2013), Hyperlipidemia LDL goal <70 (10/13/2015), Hypertension, Hyperthyroidism, PAF (paroxysmal atrial fibrillation) ([place]) (04/29/2015), Pneumonia (~ 1976), Tremors of nervous system, and Type II diabetes mellitus ([place]).    She  reports that she quit smoking about 2 years ago. Her smoking use included cigarettes. She smoked 0.00 packs per day for 41.00 years. She has never used smokeless tobacco. She reports that she does not drink alcohol or use drugs. She  reports that she does not currently engage in sexual activity. She reports using the following methods of birth control/protection: Post-menopausal and Surgical. The patient  has a past surgical history that includes Tonsillectomy (1976); Cesarean section (6659; 1984); Carpal  tunnel release (Right, Nov 2015); Knee arthroscopy (Right, ~ 2003); Shoulder open rotator cuff repair (Right, 1996; 1998); Foot neuroma surgery (Bilateral, 2000); Carpal tunnel release (Right, 1992; 05/2014); Thyroid surgery (2000); Cardiac catheterization (N/A, 02/27/2015); Cardiac catheterization (N/A, 03/06/2015); Abdominal hysterectomy (1988); Amputation (Right, 01/23/2016); Amputation (Right, 02/13/2016); and Cardioversion.  Her family history includes Allergies in her sister; Breast cancer in her maternal grandmother and mother; COPD in her father; Cancer (age of onset: 49) in her mother; Emphysema in her maternal grandfather and paternal grandfather; Heart attack in her father; Heart disease (age of onset: 48) in her father; Hypertension in her father; Leukemia in her paternal grandmother; Lung cancer in her mother; Parkinson's disease in her father.  Review of Systems  Constitutional: Negative for chills, diaphoresis and fever.  Eyes: Negative.   Respiratory: Negative for cough, hemoptysis, sputum production, shortness of breath and wheezing.   Cardiovascular: Negative for chest pain, orthopnea and leg swelling.  Gastrointestinal: Negative for nausea.  Skin: Negative for rash.  Neurological: Negative for dizziness, sensory change, speech change, focal weakness and headaches.    The problem list and medications were reviewed and updated by myself where necessary and exist elsewhere in the encounter.   OBJECTIVE:  BP 128/80   Pulse 79   Temp 98.2 F (36.8 C) (Oral)   Resp 17   Ht 5' 4.5" (1.638 m)   Wt 179 lb (81.2 kg)   SpO2 98%   BMI 30.25 kg/m   Lab Results  Component Value Date   CREATININE 0.76 08/09/2017   Physical Exam  Constitutional: She is oriented to person, place, and time. She appears well-nourished. No distress.  Eyes: Pupils are equal, round, and reactive to light. EOM are normal.  Cardiovascular: Normal rate, regular rhythm, S1 normal, S2 normal, normal heart  sounds and  intact distal pulses. Exam reveals no gallop, no friction rub and no decreased pulses.  No murmur heard. Pulmonary/Chest: Effort normal. No stridor. No respiratory distress. She has no wheezes. She has no rales.  Abdominal: She exhibits no distension.  Musculoskeletal: She exhibits no edema.  Neurological: She is alert and oriented to person, place, and time. No cranial nerve deficit. Gait normal.  Skin: Skin is dry. She is not diaphoretic.  Psychiatric: She has a normal mood and affect.  Vitals reviewed.   No results found for this or any previous visit (from the past 72 hour(s)).  No results found.  ASSESSMENT AND PLAN:  Judith [name] was seen today for medication refill.  Diagnoses and all orders for this visit:  Neuropathic pain: Well controlled.  Will continue tramadol. -     traMADol (ULTRAM) 50 MG tablet; Take 1 tablet (50 mg total) by mouth every 8 (eight) hours as needed.    The patient is advised to call or return to clinic if she does not see an improvement in symptoms, or to seek the care of the closest emergency department if she worsens with the above plan.   Philis Fendt, MHS, PA-C [facility] 08/31/2017 3:14 PM

## 2017-08-31 NOTE — Patient Instructions (Addendum)
Don't take any NSAIDS, EVER, while take blood thinner. Always take tramadol and/or tylenol.       IF you received an x-ray today, you will receive an invoice from [facility]. Please contact [facility] at [phone] with questions or concerns regarding your invoice.   IF you received labwork today, you will receive an invoice from [place]. Please contact [facility] at [phone] with questions or concerns regarding your invoice.   Our billing staff will not be able to assist you with questions regarding bills from these companies.  You will be contacted with the lab results as soon as they are available. The fastest way to get your results is to activate your My Chart account. Instructions are located on the last page of this paperwork. If you have not heard from [place] regarding the results in 2 weeks, please contact this office.

## 2017-08-31 NOTE — Telephone Encounter (Signed)
Incoming fax from [facility] requesting the following prescriptions be filled: Albuterol Sulfate, 2.5mg /36ml Solution for nebulizer, fluoxetine 20mg  capsule, gabapentin 300mg  capsule, pantoprazole 40mg  tablet. Medication refills sent to [facility].

## 2017-09-01 ENCOUNTER — Ambulatory Visit: Payer: Medicare PPO | Admitting: Physician Assistant

## 2017-09-05 ENCOUNTER — Encounter: Payer: Self-pay | Admitting: Endocrinology

## 2017-09-05 ENCOUNTER — Ambulatory Visit: Payer: Medicare PPO | Admitting: Endocrinology

## 2017-09-05 VITALS — BP 118/78 | HR 75 | Wt 178.2 lb

## 2017-09-05 DIAGNOSIS — E1165 Type 2 diabetes mellitus with hyperglycemia: Secondary | ICD-10-CM | POA: Diagnosis not present

## 2017-09-05 MED ORDER — INSULIN LISPRO 100 UNIT/ML (KWIKPEN): PEN_INJECTOR | SUBCUTANEOUS | 11 refills | Status: DC

## 2017-09-05 NOTE — Progress Notes (Signed)
Subjective:    Patient ID: Judith [name], female    DOB: 1950/10/28, 67 y.o.   MRN: [id number]  HPI Pt returns for f/u of hyperthyroidism (pt says she took synthroid for a brief time in the 1990's, in an attempt to shrink a goiter; slightly suppressed TSH was first noted in late 2016, when she was in the hospital for new-onset AF; she converted back to SR; nuc med scan showed heterogeneous uptake; neck scar is from C-spine procedure). she takes tapazole as rx'ed.  pt states she feels well in general.   Pt also ret for f/u of DM: DM type: Insulin-requiring type 2. Dx'ed: 6546 Complications: polyneuropathy, nephropathy, retinopathy, and CAD.  Therapy: insulin since 1997, and metformin.   GDM: never.  DKA: never. Severe hypoglycemia: never.   Pancreatitis: never.   Other: she takes multiple daily injections; she says reg and novolog both cause sob.  Interval history: no cbg record, but states it varies from 83-341.  There is no trend throughout the day.  pt states she feels better in general, since recent MI.   Past Medical History:  Diagnosis Date  . Abnormal EKG 07/31/2013  . Arthritis    "hands" (03/06/2015)  . Asthma   . Carpal tunnel syndrome, bilateral   . Chronic kidney disease   . Complication of anesthesia    slow to wake up  . Coronary artery disease     2 v CAD with CTO of the RCA and high grade bifurcational LCx/OM stenosis. S/P PCI DES x 2 to the LCx/OM.  Marland Kitchen Diabetic peripheral neuropathy ([place]) "since 1996"  . GERD (gastroesophageal reflux disease)   . Goiter   . Headache    migraines prior to menopause  . History of shingles 06/01/2013  . Hyperlipidemia LDL goal <70 10/13/2015  . Hypertension   . Hyperthyroidism   . PAF (paroxysmal atrial fibrillation) ([place]) 04/29/2015   CHADS2VASC score of 4 now on Apixaban  . Pneumonia ~ 1976  . Tremors of nervous system   . Type II diabetes mellitus (HCC)    insulin dependent    Past Surgical History:  Procedure Laterality  Date  . ABDOMINAL HYSTERECTOMY  1988   age 55; CERVICAL DYSPLASIA; ovaries intact.   . AMPUTATION Right 01/23/2016   Procedure: Right 3rd Ray Amputation;  Surgeon: [name], MD;  Location: [facility];  Service: Orthopedics;  Laterality: Right;  . AMPUTATION Right 02/13/2016   Procedure: Right Transmetatarsal Amputation;  Surgeon: [name], MD;  Location: [facility];  Service: Orthopedics;  Laterality: Right;  . CARDIAC CATHETERIZATION N/A 02/27/2015   Procedure: Left Heart Cath and Coronary Angiography;  Surgeon: [name], MD; LAD 40%, mCFX 80%, OM 70%, RCA 100% calcified       . CARDIAC CATHETERIZATION N/A 03/06/2015   Procedure: Coronary Stent Intervention;  Surgeon: [name], MD;  Location: [facility];  Service: Cardiovascular;  Laterality: N/A;  Mid CX 3.50x12 promus DES w/ 0% resdual and Prox OM1 2.50x20 promus DES w/ 20% residual  . CARDIOVERSION    . CARPAL TUNNEL RELEASE Right Nov 2015  . CARPAL TUNNEL RELEASE Right 1992; 05/2014   [name]; [place]  . CESAREAN SECTION  1982; 1984  . FOOT NEUROMA SURGERY Bilateral 2000  . KNEE ARTHROSCOPY Right ~ 2003   "meniscus repair"  . SHOULDER OPEN ROTATOR CUFF REPAIR Right 1996; 1998   "w/fracture repair"  . THYROID SURGERY  2000   "removed lots of nodules"  . TONSILLECTOMY  1976    Social History   Socioeconomic History  . Marital status: Single    Spouse name: Not on file  . Number of children: 2  . Years of education: Masters  . Highest education level: Not on file  Occupational History  . Occupation: Landscape architect  Social Needs  . Financial resource strain: Somewhat hard  . Food insecurity:    Worry: Sometimes true    Inability: Sometimes true  . Transportation needs:    Medical: No    Non-medical: No  Tobacco Use  . Smoking status: Former Smoker    Packs/day: 0.00    Years: 41.00    Pack years: 0.00    Types: Cigarettes    Last attempt to quit: 03/05/2015    Years since quitting: 2.5  .  Smokeless tobacco: Never Used  . Tobacco comment: 04/29/2015 "quit smoking cigarettes 02/27/2015"  Substance and Sexual Activity  . Alcohol use: No  . Drug use: No  . Sexual activity: Not Currently    Birth control/protection: Post-menopausal, Surgical  Lifestyle  . Physical activity:    Days per week: 0 days    Minutes per session: 0 min  . Stress: Very much  Relationships  . Social connections:    Talks on phone: More than three times a week    Gets together: More than three times a week    Attends religious service: More than 4 times per year    Active member of club or organization: No    Attends meetings of clubs or organizations: Never    Relationship status: Never married  . Intimate partner violence:    Fear of current or ex partner: No    Emotionally abused: No    Physically abused: No    Forced sexual activity: No  Other Topics Concern  . 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      Lives with sister in [place].      Tobacco: 1 ppd x 41 years - quit 2016      Alcohol: none      Drugs: none      Exercise:  Walking in neighborhood; physical job.   Right-handed.   2 cups caffeine daily.    Current Outpatient Medications on File Prior to Visit  Medication Sig Dispense Refill  . albuterol (PROVENTIL HFA) 108 (90 Base) MCG/ACT inhaler Inhale 1-2 puffs into the lungs every 6 (six) hours as needed for wheezing or shortness of breath. 1 Inhaler 0  . albuterol (PROVENTIL) (2.5 MG/3ML) 0.083% nebulizer solution Take 3 mLs (2.5 mg total) by nebulization every 6 (six) hours as needed for wheezing or shortness of breath. 150 mL 0  . Alcohol Swabs (B-D SINGLE USE SWABS REGULAR) PADS 1 each by Does not apply route 2 (two) times daily. 100 each 2  .  amLODipine (NORVASC) 10 MG tablet Take 1 tablet (10 mg total) by mouth daily. 90 tablet 3  . apixaban (ELIQUIS) 5 MG TABS tablet Take 1 tablet (5 mg total) by mouth 2 (two) times daily. 180 tablet 3  . atorvastatin (LIPITOR) 20 MG tablet Take 1 tablet (20 mg total) by mouth daily. 90 tablet 3  . Blood Glucose Calibration (TRUE METRIX LEVEL 1) Low  SOLN 1 each by In Vitro route as needed. 1 each 0  . Blood Glucose Monitoring Suppl (TRUE METRIX AIR GLUCOSE METER) w/Device KIT 1 each by Does not apply route daily. 1 kit 0  . buPROPion (WELLBUTRIN SR) 150 MG 12 hr tablet TAKE ONE TABLET BY MOUTH TWICE DAILY 60 tablet 11  . fenofibrate (TRICOR) 145 MG tablet Take 1 tablet (145 mg total) by mouth daily. 90 tablet 3  . FLUoxetine (PROZAC) 20 MG capsule Take 1 capsule (20 mg total) by mouth at bedtime. 90 capsule 2  . gabapentin (NEURONTIN) 300 MG capsule Take 3 capsules (900 mg total) by mouth 2 (two) times daily. TAKE 3 CAPSULES BY MOUTH EVERY  MORNING AND 3 CAPSULES AT BEDTIME 540 capsule 0  . glucose blood (TRUE METRIX BLOOD GLUCOSE TEST) test strip Used to check blood sugars 2x daily. 100 each 12  . insulin degludec (TRESIBA FLEXTOUCH) 100 UNIT/ML SOPN FlexTouch Pen Inject 0.2 mLs (20 Units total) into the skin daily at 10 pm. 15 mL 0  . Insulin Pen Needle 32G X 4 MM MISC Use to inject insulin daily 100 each 5  . Insulin Syringe-Needle U-100 (INSULIN SYRINGE .5CC/31GX5/16") 31G X 5/16" 0.5 ML MISC Use to inject insulin 100 each 5  . magnesium oxide (MAG-OX) 400 (241.3 Mg) MG tablet Take 1 tablet (400 mg total) by mouth daily. 30 tablet 6  . metFORMIN (GLUCOPHAGE-XR) 500 MG 24 hr tablet Take 4 tablets (2,000 mg total) by mouth daily with breakfast. 360 tablet 3  . methimazole (TAPAZOLE) 10 MG tablet Take 1 tablet (10 mg total) by mouth daily. 30 tablet 11  . metoprolol succinate (TOPROL-XL) 25 MG 24 hr tablet Take 1 tablet (25 mg total) by mouth 2 (two) times daily. 180 tablet 3  . nitroGLYCERIN  (NITROSTAT) 0.4 MG SL tablet Place 1 tablet (0.4 mg total) under the tongue every 5 (five) minutes as needed for chest pain. 25 tablet 3  . pantoprazole (PROTONIX) 40 MG tablet Take 1 tablet (40 mg total) by mouth daily. 90 tablet 0  . silver sulfADIAZINE (SILVADENE) 1 % cream Apply 1 application topically daily. 50 g 1  . sotalol (BETAPACE) 120 MG tablet Take 1 tablet (120 mg total) by mouth every 12 (twelve) hours. 180 tablet 3  . SSD 1 % cream APPLY TO AFFECTED AREA ONCE DAILY 50 g 0  . traMADol (ULTRAM) 50 MG tablet Take 1 tablet (50 mg total) by mouth every 8 (eight) hours as needed. 90 tablet 5  . TRUEPLUS LANCETS 28G MISC 1 each by Does not apply route 2 (two) times daily. 100 each 2   No current facility-administered medications on file prior to visit.     Allergies  Allergen Reactions  . Contrast Media [Iodinated Diagnostic Agents] Hives    Spoke to patient, Iodine allergy is really IV contrast allergy.   . Dilaudid [Hydromorphone Hcl] Other (See Comments)    HEADACHE  . Novolog [Insulin Aspart] Shortness Of Breath    "breathing problems"  . Codeine Nausea And Vomiting    HIGH DOSES-SEVERE VOMITING  . Iodine Other (See Comments)    MUST HAVE BENADRYL PRIOR TO PROCEDURE AND RIGHT BEFORE TREATMENT TO COUNTERACT REACTION-BLISTERING REACTION DERMATOLOGICAL  . Penicillins Itching, Rash and Other (See Comments)    Has patient had a PCN reaction causing immediate rash, facial/tongue/throat swelling, SOB or lightheadedness with hypotension: Yes Has patient had a PCN reaction causing severe rash involving mucus membranes or skin necrosis: No Has  patient had a PCN reaction that required hospitalization No Has patient had a PCN reaction occurring within the last 10 years: No If all of the above answers are "NO", then may proceed with Cephalosporin use.  CHEST SIZED RASH AND ITCHING   . Propofol Other (See Comments)    "Breathing problems - asthma attack" Can take with benadryl   .  Ace Inhibitors Cough  . Demerol [Meperidine] Nausea And Vomiting  . Neosporin [Neomycin-Bacitracin Zn-Polymyx] Itching and Rash    MAKES REACTIONS WORSE WHEN USING AS PROPHYLACTIC  . Percocet [Oxycodone-Acetaminophen] Rash  . Tape Itching and Rash    Family History  Problem Relation Age of Onset  . Cancer Mother 33       bronchial cancer  . Breast cancer Mother   . Lung cancer Mother   . Hypertension Father   . COPD Father   . Heart disease Father 10       CAD with cardiac stenting  . Heart attack Father   . Parkinson's disease Father   . Allergies Sister   . Breast cancer Maternal Grandmother   . Emphysema Maternal Grandfather   . Leukemia Paternal Grandmother   . Emphysema Paternal Grandfather   . Thyroid disease Neg Hx     BP 118/78   Pulse 75   Wt 178 lb 3.2 oz (80.8 kg)   SpO2 96%   BMI 30.12 kg/m    Review of Systems She denies hypoglycemia.      Objective:   Physical Exam VITAL SIGNS:  See vs page GENERAL: no distress.  In wheelchair Pulses: foot pulses are intact bilaterally.   MSK: no deformity of the feet, except for right transmetatarsal amputation.  CV: no edema of the legs Skin:  no ulcer on the feet or ankles.  normal color and temp on the feet and ankles Neuro: sensation is intact to touch on the feet and ankles, but decreased from normal Ext: There is bilateral onychomycosis of the toenails   Lab Results  Component Value Date   HGBA1C 7.9 (H) 08/06/2017   Lab Results  Component Value Date   CREATININE 0.76 08/09/2017   BUN 11 08/09/2017   NA 140 08/09/2017   K 4.3 08/09/2017   CL 105 08/09/2017   CO2 26 08/09/2017   Lab Results  Component Value Date   TSH 0.977 08/06/2017   T4TOTAL 7.8 04/30/2015      Assessment & Plan:  Noncompliance with cbg recording.  We discussed.  In this setting, we can't safely increase insulin now, especially in view of recent MI.  Hyperthyroidism: well-controlled.  Insulin-requiring type 2 DM: this is  the best control this pt should aim for, given the above.   Patient Instructions  check your blood sugar twice a day.  vary the time of day when you check, between before the 3 meals, and at bedtime.  also check if you have symptoms of your blood sugar being too high or too low.  please keep a record of the readings and bring it to your next appointment here (or you can bring the meter itself).  You can write it on any piece of paper.  please call us sooner if your blood sugar goes below 70, or if you have a lot of readings over 200.  Please continue the same medications for now.   Please come back for a follow-up appointment in 3 months.

## 2017-09-05 NOTE — Patient Instructions (Addendum)
check your blood sugar twice a day.  vary the time of day when you check, between before the 3 meals, and at bedtime.  also check if you have symptoms of your blood sugar being too high or too low.  please keep a record of the readings and bring it to your next appointment here (or you can bring the meter itself).  You can write it on any piece of paper.  please call us sooner if your blood sugar goes below 70, or if you have a lot of readings over 200.  Please continue the same medications for now.   Please come back for a follow-up appointment in 3 months.

## 2017-09-14 DIAGNOSIS — H43812 Vitreous degeneration, left eye: Secondary | ICD-10-CM | POA: Diagnosis not present

## 2017-09-14 DIAGNOSIS — H524 Presbyopia: Secondary | ICD-10-CM | POA: Diagnosis not present

## 2017-09-14 DIAGNOSIS — H2513 Age-related nuclear cataract, bilateral: Secondary | ICD-10-CM | POA: Diagnosis not present

## 2017-09-14 DIAGNOSIS — E113393 Type 2 diabetes mellitus with moderate nonproliferative diabetic retinopathy without macular edema, bilateral: Secondary | ICD-10-CM | POA: Diagnosis not present

## 2017-09-14 LAB — HM DIABETES EYE EXAM

## 2017-09-19 ENCOUNTER — Encounter: Payer: Self-pay | Admitting: Physician Assistant

## 2017-09-19 ENCOUNTER — Ambulatory Visit: Payer: Medicare PPO | Admitting: Physician Assistant

## 2017-09-19 ENCOUNTER — Other Ambulatory Visit: Payer: Self-pay

## 2017-09-19 VITALS — BP 136/70 | HR 109 | Temp 98.1°F | Resp 16 | Ht 64.5 in | Wt 177.4 lb

## 2017-09-19 DIAGNOSIS — R000 Tachycardia, unspecified: Secondary | ICD-10-CM | POA: Diagnosis not present

## 2017-09-19 DIAGNOSIS — R197 Diarrhea, unspecified: Secondary | ICD-10-CM

## 2017-09-19 LAB — POCT CBC
GRANULOCYTE PERCENT: 65.7 % (ref 37–80)
HCT, POC: 32.7 % — AB (ref 37.7–47.9)
Hemoglobin: 10.8 g/dL — AB (ref 12.2–16.2)
Lymph, poc: 2 (ref 0.6–3.4)
MCH, POC: 27.8 pg (ref 27–31.2)
MCHC: 32.9 g/dL (ref 31.8–35.4)
MCV: 84.5 fL (ref 80–97)
MID (CBC): 0.3 (ref 0–0.9)
MPV: 6.3 fL (ref 0–99.8)
PLATELET COUNT, POC: 412 10*3/uL (ref 142–424)
POC Granulocyte: 4.5 (ref 2–6.9)
POC LYMPH %: 29.4 % (ref 10–50)
POC MID %: 4.9 %M (ref 0–12)
RBC: 3.87 M/uL — AB (ref 4.04–5.48)
RDW, POC: 15.8 %
WBC: 6.8 10*3/uL (ref 4.6–10.2)

## 2017-09-19 LAB — GLUCOSE, POCT (MANUAL RESULT ENTRY): POC GLUCOSE: 221 mg/dL — AB (ref 70–99)

## 2017-09-19 NOTE — Progress Notes (Signed)
09/19/2017 4:27 PM   DOB: July 09, 1950 / MRN: [id number]  SUBJECTIVE:  Judith [name] is a 67 y.o. female presenting for profuse diarrhea that started 3 days ago.  Tells me this started after eating at a [place].  Describes the stools as yellow and floating on top of the water.  Denies any blood from the rectum.  Has a history of poorly controlled diabetes.  She has not urinated today.  She is minimally giving a stool sample today while she is here.  She denies chest pain, shortness of breath, DOE, leg swelling, orthopnea. She has been taking Zofran and Imodium which is been giving her some control  She is allergic to contrast media [iodinated diagnostic agents]; dilaudid [hydromorphone hcl]; novolog [insulin aspart]; codeine; iodine; penicillins; propofol; ace inhibitors; demerol [meperidine]; neosporin [neomycin-bacitracin zn-polymyx]; percocet [oxycodone-acetaminophen]; and tape.   She  has a past medical history of Abnormal EKG (07/31/2013), Arthritis, Asthma, Carpal tunnel syndrome, bilateral, Chronic kidney disease, Complication of anesthesia, Coronary artery disease, Diabetic peripheral neuropathy ([place]) ("since 1996"), GERD (gastroesophageal reflux disease), Goiter, Headache, History of shingles (06/01/2013), Hyperlipidemia LDL goal <70 (10/13/2015), Hypertension, Hyperthyroidism, PAF (paroxysmal atrial fibrillation) ([place]) (04/29/2015), Pneumonia (~ 1976), Tremors of nervous system, and Type II diabetes mellitus ([facility]).    She  reports that she quit smoking about 2 years ago. Her smoking use included cigarettes. She smoked 0.00 packs per day for 41.00 years. She has never used smokeless tobacco. She reports that she does not drink alcohol or use drugs. She  reports that she does not currently engage in sexual activity. She reports using the following methods of birth control/protection: Post-menopausal and Surgical. The patient  has a past surgical history that includes Tonsillectomy  (1976); Cesarean section (5638; 1984); Carpal tunnel release (Right, Nov 2015); Knee arthroscopy (Right, ~ 2003); Shoulder open rotator cuff repair (Right, 1996; 1998); Foot neuroma surgery (Bilateral, 2000); Carpal tunnel release (Right, 1992; 05/2014); Thyroid surgery (2000); Cardiac catheterization (N/A, 02/27/2015); Cardiac catheterization (N/A, 03/06/2015); Abdominal hysterectomy (1988); Amputation (Right, 01/23/2016); Amputation (Right, 02/13/2016); and Cardioversion.  Her family history includes Allergies in her sister; Breast cancer in her maternal grandmother and mother; COPD in her father; Cancer (age of onset: 56) in her mother; Emphysema in her maternal grandfather and paternal grandfather; Heart attack in her father; Heart disease (age of onset: 31) in her father; Hypertension in her father; Leukemia in her paternal grandmother; Lung cancer in her mother; Parkinson's disease in her father.  ROS  per HPI   OBJECTIVE:  BP 136/70 (BP Location: Left Arm, Patient Position: Sitting, Cuff Size: Normal)   Pulse (!) 109   Temp 98.1 F (36.7 C) (Oral)   Resp 16   Ht 5' 4.5" (1.638 m)   Wt 177 lb 6.4 oz (80.5 kg)   SpO2 96%   BMI 29.98 kg/m    Pulse Readings from Last 3 Encounters:  09/19/17 (!) 109  09/05/17 75  08/31/17 79   Orthostatic VS for the past 24 hrs:  BP- Lying Pulse- Lying BP- Sitting Pulse- Sitting BP- Standing at 0 minutes Pulse- Standing at 0 minutes  09/19/17 1606 151/83 68 149/81 71 162/80 74       Physical Exam  Constitutional: She is oriented to person, place, and time. She appears well-nourished.  Non-toxic appearance. No distress.  Eyes: Pupils are equal, round, and reactive to light. EOM are normal.  Cardiovascular: Normal rate.  Pulmonary/Chest: Effort normal. No stridor. No respiratory distress. She has no wheezes.  She has no rales.  Abdominal: Normal appearance. She exhibits no distension. Bowel sounds are increased. There is generalized tenderness. There  is no rigidity, no rebound and no guarding.  Neurological: She is alert and oriented to person, place, and time. No cranial nerve deficit. Gait normal.  Skin: Skin is warm and dry. She is not diaphoretic. No pallor.  Psychiatric: She has a normal mood and affect.  Vitals reviewed.   Results for orders placed or performed in visit on 09/19/17 (from the past 72 hour(s))  POCT CBC     Status: Abnormal   Collection Time: 09/19/17  4:14 PM  Result Value Ref Range   WBC 6.8 4.6 - 10.2 K/uL   Lymph, poc 2.0 0.6 - 3.4   POC LYMPH PERCENT 29.4 10 - 50 %L   MID (cbc) 0.3 0 - 0.9   POC MID % 4.9 0 - 12 %M   POC Granulocyte 4.5 2 - 6.9   Granulocyte percent 65.7 37 - 80 %G   RBC 3.87 (A) 4.04 - 5.48 M/uL   Hemoglobin 10.8 (A) 12.2 - 16.2 g/dL   HCT, POC 32.7 (A) 37.7 - 47.9 %   MCV 84.5 80 - 97 fL   MCH, POC 27.8 27 - 31.2 pg   MCHC 32.9 31.8 - 35.4 g/dL   RDW, POC 15.8 %   Platelet Count, POC 412 142 - 424 K/uL   MPV 6.3 0 - 99.8 fL  POCT glucose (manual entry)     Status: Abnormal   Collection Time: 09/19/17  4:17 PM  Result Value Ref Range   POC Glucose 221 (A) 70 - 99 mg/dl   CBC Latest Ref Rng & Units 09/19/2017 08/10/2017 08/09/2017  WBC 4.6 - 10.2 K/uL 6.8 8.0 9.0  Hemoglobin 12.2 - 16.2 g/dL 10.8(A) 9.3(L) 10.2(L)  Hematocrit 37.7 - 47.9 % 32.7(A) 29.5(L) 31.0(L)  Platelets 150 - 400 K/uL - 382 334      No results found.  ASSESSMENT AND PLAN:  Gavin was seen today for abdominal pain.  Diagnoses and all orders for this visit:  Diarrhea of presumed infectious origin: Her orthostatic vital signs are reassuring as is her exam.  This is toxin ingestion versus viral versus bacterial.  A GI stool PCR should be helpful.  Advised she consume copious amounts of fluid -     CMP and Liver -     Lipase -     GI Profile, Stool, PCR  Tachycardia -     Orthostatic vital signs -     POCT CBC -     POCT glucose (manual entry)    The patient is advised to call or return to clinic if  she does not see an improvement in symptoms, or to seek the care of the closest emergency department if she worsens with the above plan.   Philis Fendt, MHS, PA-C [facility] 09/19/2017 4:26 PM

## 2017-09-19 NOTE — Patient Instructions (Addendum)
Please look up the brat diet online to find some ideas about what to eat that can help reduce diarrhea. You labs are stable now.  Lets check the GI panel to see what is going on.     IF you received an x-ray today, you will receive an invoice from [facility]. Please contact [facility] at [phone] with questions or concerns regarding your invoice.   IF you received labwork today, you will receive an invoice from [facility]. Please contact [facility] at [phone] with questions or concerns regarding your invoice.   Our billing staff will not be able to assist you with questions regarding bills from these companies.  You will be contacted with the lab results as soon as they are available. The fastest way to get your results is to activate your My Chart account. Instructions are located on the last page of this paperwork. If you have not heard from [place] regarding the results in 2 weeks, please contact this office.

## 2017-09-20 ENCOUNTER — Encounter: Payer: Self-pay | Admitting: Neurology

## 2017-09-20 ENCOUNTER — Ambulatory Visit: Payer: Medicare PPO | Admitting: Neurology

## 2017-09-20 VITALS — BP 147/78 | HR 71 | Ht 64.0 in | Wt 178.0 lb

## 2017-09-20 DIAGNOSIS — J45901 Unspecified asthma with (acute) exacerbation: Secondary | ICD-10-CM | POA: Diagnosis not present

## 2017-09-20 DIAGNOSIS — J440 Chronic obstructive pulmonary disease with acute lower respiratory infection: Secondary | ICD-10-CM | POA: Diagnosis not present

## 2017-09-20 DIAGNOSIS — J209 Acute bronchitis, unspecified: Secondary | ICD-10-CM

## 2017-09-20 DIAGNOSIS — J4551 Severe persistent asthma with (acute) exacerbation: Secondary | ICD-10-CM | POA: Diagnosis not present

## 2017-09-20 DIAGNOSIS — G4736 Sleep related hypoventilation in conditions classified elsewhere: Secondary | ICD-10-CM | POA: Diagnosis not present

## 2017-09-20 DIAGNOSIS — R0683 Snoring: Secondary | ICD-10-CM | POA: Diagnosis not present

## 2017-09-20 LAB — CMP AND LIVER
ALBUMIN: 4.2 g/dL (ref 3.6–4.8)
ALK PHOS: 79 IU/L (ref 39–117)
ALT: 10 IU/L (ref 0–32)
AST: 13 IU/L (ref 0–40)
BILIRUBIN TOTAL: 0.3 mg/dL (ref 0.0–1.2)
BUN: 17 mg/dL (ref 8–27)
Bilirubin, Direct: 0.1 mg/dL (ref 0.00–0.40)
CHLORIDE: 99 mmol/L (ref 96–106)
CO2: 21 mmol/L (ref 20–29)
CREATININE: 0.93 mg/dL (ref 0.57–1.00)
Calcium: 8.8 mg/dL (ref 8.7–10.3)
GFR calc Af Amer: 75 mL/min/{1.73_m2} (ref >59)
GFR, EST NON AFRICAN AMERICAN: 65 mL/min/{1.73_m2} (ref >59)
Glucose: 246 mg/dL — ABNORMAL HIGH (ref 65–99)
POTASSIUM: 4.1 mmol/L (ref 3.5–5.2)
SODIUM: 137 mmol/L (ref 134–144)
TOTAL PROTEIN: 7.1 g/dL (ref 6.0–8.5)

## 2017-09-20 LAB — LIPASE: Lipase: 11 U/L — ABNORMAL LOW (ref 14–72)

## 2017-09-20 NOTE — Progress Notes (Signed)
SLEEP MEDICINE CLINIC   Provider:  Larey [name], M D  Referring Provider: [name], MD Primary Care Physician:  Judith Honour, MD  Chief Complaint  Patient presents with  . New Patient (Initial Visit)    pt alone, rm 10. pt states she was recommended to come several years ago. pt was told in hospital that she stops breathing and snores.    HPI:  Judith [name] is a 67 y.o. female , seen here in a repeated  referral for a sleep consultation,   09-20-2017, I have the pleasure to see Judith [name] in a sleep consultation.  She had recently multiple hospitalizations and was told by the hospital physicians and nursing staff that she had repeated apnea during her sleep, oxygen desaturations while asleep.  She had recently seen her primary care who refilled nebulized albuterol, Tessalon capsules and gave her a short course of steroids a tricky enterprise [name] that she is a very brittle diabetic.  She stated such her endocrinologist finally figured out that her cough and bronchitis were not related to an infection but to the use of a generic insulin.  She cannot tolerate this insulin and had to go back to brand name.  Her diabetes has led to multiple consequences in September 2017 she had an amputation of the right middle toe, and an amputation of the right foot followed  in October 2017( Judith [name] ) . She reports excessive daytime sleepiness. She was told she snores, and has wheezing ( RN on telemetry floor).  Her diabetes  is now better controlled than in 2017/18 as she couldn't afford COBRA and waited for medicare to cover her. She suffers form diarrhea , and she still has coughing. She wonders about sleep apnea.      2017 consultation:  Judith [name] was recently evaluated in hospital and every time her cardiologist came on rounds he found her asleep, it was suggested that she undergoes a sleep evaluation for hypersomnia also because she had atrial fibrillation and is chronically  anticoagulated. Atrial fibrillation has a strong link to obstructive sleep apnea. The patient had seen my colleague, Judith [name], for tremor and memory loss.  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 She endorsed sleeping a lot, but she usually wakes up without hesitation in the morning rises and starts her day. She does not think that her sleep has a low quality or is not refreshing and restoring. But just during the day she needs more of it. As long as she is physically active and mentally stimulated she will not not off but when she comes home from work she would like to take a nap and finds herself asleep for 2 or 3 hours on stretch. She is unaware if she is snoring a lot as she lives alone. She has been divorced for 9 years but during her hospital stay doctors and nurses mention to her that she actually snores.  Sleep habits are as follows:  She is retired. Lives with her sister , has 4 grandchildren.  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  She will be home by 7 and is in bed by 9 PM. She falls asleep instantly but wakes up 3-5 times with nocturia- very frequent.  She wakes spontaneously. She sleeps away form her sisters room- and has never been told by her if she snores. Flat  mattress, she falls asleep on her side, usually using 4 pillows. In hospital she slept on a raised head of bed and well. Home bedroom is cool, quiet and dark .   Sleep medical history and family sleep history:  Atrial fib, brittle DM, Nocturia, chronic cough, bronchitis, palpitations, had a MI on 08-11-2017 , while in route to ED [place]. Ambulance and ED confirmed cardiac enzymes were elevated. Dr. Pernell [name].  Her younger brother died of an acute heart attack at age 69,  her sister Judith [name] is one year her junior and has a pacemaker, and had an MI- had pen heart surgery in 2018  and her brother Judith [name] 61 is in very good health.  Social history:  divorced, 2 adult children, 4 grandchildren, lives with her sister. Works part time in [employer].   Ex smoker, Nov 2016 quit. ETOH none, caffeine intake, 1 cup in AM. No soda, no energy drinks, no iced tea.    History by Dr Judith [name] ; July 19th 2017  Judith [name] is a 67 years old right-handed female, seen in refer by her primary care physician Dr. Wardell [name] on August 12 2015 for evaluation of bilateral hands tremor, falling episode, and memory loss, she is accompanied by her sister Judith [name]. She had a history of hypertension, hyperlipidemia, diabetes for more than 20 years, atrial fibrillation, on chronic Eliquis treatment, coronary artery disease, status post stent, chronic kidney disease, she is a retired Education officer, museum, now works for [employer], including heavy lifting. She was noted to have memory loss over the past few years, she needs to be reminded about appointment, had word finding difficulties, she still drives without getting loss, she lives with her sister. Her father has Parkinson's disease, she noted bilateral hands shaking since 2012, getting worse holding small subject, writing, her sister also suffered similar bilateral hands shaking.She had long-standing history of diabetic peripheral neuropathy, complains of bilateral feet numbness tingling, taking gabapentin and the muscle relaxant.She had a few fall accident, she also felt lightheaded dizzy when she first got up from overnight sleep, occasionally falling down when she get up quickly after prolonged sitting, she denies significant gait difficulty, no bowel and bladder incontinence, over the years, she had worsening progressing ascending paresthesia, now has fingertips numbness tingling, mild bilateral hands weakness, mild difficulty opening bottles.  Laboratory evaluation showed mild low TSH, she is going through treatment for hyperthyroidism, low normal B12 290, vitamin D was mildly low 28, normal CMP, with exception of elevated  glucose, 311, CBC, A1c was elevated 8.2I have personally reviewed CAT scan in January 2017, CAT scan of the brain, mild generalized atrophy, periventricular small vessel disease, calcification at left thalamus  CAT scan of cervical spine, multilevel mild degenerative changes.    Review of Systems: Out of a complete 14 system review, the patient complains of only the following symptoms, and all other reviewed systems are negative. The patient endorsed the Epworth Sleepiness Scale at 18 points ( was 14 points 2 years ago ) , the fatigue severity scale at 35 points. No depression .   How likely are you to doze in the following situations: 0 = not likely, 1 = slight chance, 2 = moderate chance, 3 = high chance  Sitting and Reading? 3Watching Television? 3Sitting inactive in a public place (theater or meeting)?3Lying down in the afternoon when circumstances permit?3Sitting and talking to someone?3 Sitting quietly after lunch without alcohol?0In a car, while stopped for a few minutes in  traffic?2As a passenger in a car for an hour without a break?3   Social History   Socioeconomic History  . Marital status: Single    Spouse name: Not on file  . Number of children: 2  . Years of education: Masters  . Highest education level: Not on file  Occupational History  . Occupation: Landscape architect  Social Needs  . Financial resource strain: Somewhat hard  . Food insecurity:    Worry: Sometimes true    Inability: Sometimes true  . Transportation needs:    Medical: No    Non-medical: No  Tobacco Use  . Smoking status: Former Smoker    Packs/day: 0.00    Years: 41.00    Pack years: 0.00    Types: Cigarettes    Last attempt to quit: 03/05/2015    Years since quitting: 2.5  . Smokeless tobacco: Never Used  . Tobacco comment: 04/29/2015 "quit smoking cigarettes 02/27/2015"  Substance and Sexual Activity  . Alcohol use: No  . Drug use: No  . Sexual activity: Not Currently    Birth  control/protection: Post-menopausal, Surgical  Lifestyle  . Physical activity:    Days per week: 0 days    Minutes per session: 0 min  . Stress: Very much  Relationships  . Social connections:    Talks on phone: More than three times a week    Gets together: More than three times a week    Attends religious service: More than 4 times per year    Active member of club or organization: No    Attends meetings of clubs or organizations: Never    Relationship status: Never married  . Intimate partner violence:    Fear of current or ex partner: No    Emotionally abused: No    Physically abused: No    Forced sexual activity: No  Other Topics Concern  . 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      Lives with sister in [place].      Tobacco: 1 ppd x 41 years - quit 2016      Alcohol: none      Drugs: none      Exercise:  Walking in neighborhood; physical job.   Right-handed.   2 cups caffeine daily.    Family History  Problem Relation Age of Onset  . Cancer Mother 70       bronchial cancer  . Breast cancer Mother   . Lung cancer Mother   . Hypertension Father   . COPD Father   . Heart disease Father 31       CAD with cardiac stenting  . Heart attack Father   . Parkinson's disease Father   . Allergies Sister   . Breast cancer Maternal Grandmother   . Emphysema Maternal Grandfather   . Leukemia Paternal Grandmother   . Emphysema Paternal Grandfather   . Thyroid disease Neg Hx     Past Medical History:  Diagnosis Date  . Abnormal EKG 07/31/2013  . Arthritis    "hands" (03/06/2015)  . Asthma   . Carpal tunnel syndrome, bilateral   . Chronic kidney disease   . Complication of anesthesia    slow to wake up  . Coronary  artery disease     2 v  CAD with CTO of the RCA and high grade bifurcational LCx/OM stenosis. S/P PCI DES x 2 to the LCx/OM.  Marland Kitchen Diabetic peripheral neuropathy ([place]) "since 1996"  . GERD (gastroesophageal reflux disease)   . Goiter   . Headache    migraines prior to menopause  . History of shingles 06/01/2013  . Hyperlipidemia LDL goal <70 10/13/2015  . Hypertension   . Hyperthyroidism   . PAF (paroxysmal atrial fibrillation) ([place]) 04/29/2015   CHADS2VASC score of 4 now on Apixaban  . Pneumonia ~ 1976  . Tremors of nervous system   . Type II diabetes mellitus (H[facility])    insulin dependent    Past Surgical History:  Procedure Laterality Date  . ABDOMINAL HYSTERECTOMY  1988   age 57; CERVICAL DYSPLASIA; ovaries intact.   . AMPUTATION Right 01/23/2016   Procedure: Right 3rd Ray Amputation;  Surgeon: [name], MD;  Location: [facility];  Service: Orthopedics;  Laterality: Right;  . AMPUTATION Right 02/13/2016   Procedure: Right Transmetatarsal Amputation;  Surgeon: [name], MD;  Location: [facility];  Service: Orthopedics;  Laterality: Right;  . CARDIAC CATHETERIZATION N/A 02/27/2015   Procedure: Left Heart Cath and Coronary Angiography;  Surgeon: [name], MD; LAD 40%, mCFX 80%, OM 70%, RCA 100% calcified       . CARDIAC CATHETERIZATION N/A 03/06/2015   Procedure: Coronary Stent Intervention;  Surgeon: [name], MD;  Location: [facility];  Service: Cardiovascular;  Laterality: N/A;  Mid CX 3.50x12 promus DES w/ 0% resdual and Prox OM1 2.50x20 promus DES w/ 20% residual  . CARDIOVERSION    . CARPAL TUNNEL RELEASE Right Nov 2015  . CARPAL TUNNEL RELEASE Right 1992; 05/2014   [name]; [place]  . CESAREAN SECTION  1982; 1984  . FOOT NEUROMA SURGERY Bilateral 2000  . KNEE ARTHROSCOPY Right ~ 2003   "meniscus repair"  . SHOULDER OPEN ROTATOR CUFF REPAIR Right 1996; 1998   "w/fracture repair"  . THYROID SURGERY  2000   "removed lots of nodules"  . TONSILLECTOMY  1976    Current Outpatient  Medications  Medication Sig Dispense Refill  . albuterol (PROVENTIL HFA) 108 (90 Base) MCG/ACT inhaler Inhale 1-2 puffs into the lungs every 6 (six) hours as needed for wheezing or shortness of breath. 1 Inhaler 0  . albuterol (PROVENTIL) (2.5 MG/3ML) 0.083% nebulizer solution Take 3 mLs (2.5 mg total) by nebulization every 6 (six) hours as needed for wheezing or shortness of breath. 150 mL 0  . Alcohol Swabs (B-D SINGLE USE SWABS REGULAR) PADS 1 each by Does not apply route 2 (two) times daily. 100 each 2  . amLODipine (NORVASC) 10 MG tablet Take 1 tablet (10 mg total) by mouth daily. 90 tablet 3  . apixaban (ELIQUIS) 5 MG TABS tablet Take 1 tablet (5 mg total) by mouth 2 (two) times daily. 180 tablet 3  . atorvastatin (LIPITOR) 20 MG tablet Take 1 tablet (20 mg total) by mouth daily. 90 tablet 3  . Blood Glucose Calibration (TRUE METRIX LEVEL 1) Low SOLN 1 each by In Vitro route as needed. 1 each 0  . Blood Glucose Monitoring Suppl (TRUE METRIX AIR GLUCOSE METER) w/Device KIT 1 each by Does not apply route daily. 1 kit 0  . buPROPion (WELLBUTRIN SR) 150 MG 12 hr tablet TAKE ONE TABLET BY MOUTH TWICE DAILY 60 tablet 11  . fenofibrate (TRICOR) 145 MG tablet Take 1 tablet (145  mg total) by mouth daily. 90 tablet 3  . FLUoxetine (PROZAC) 20 MG capsule Take 1 capsule (20 mg total) by mouth at bedtime. 90 capsule 2  . gabapentin (NEURONTIN) 300 MG capsule Take 3 capsules (900 mg total) by mouth 2 (two) times daily. TAKE 3 CAPSULES BY MOUTH EVERY  MORNING AND 3 CAPSULES AT BEDTIME 540 capsule 0  . glucose blood (TRUE METRIX BLOOD GLUCOSE TEST) test strip Used to check blood sugars 2x daily. 100 each 12  . insulin degludec (TRESIBA FLEXTOUCH) 100 UNIT/ML SOPN FlexTouch Pen Inject 0.2 mLs (20 Units total) into the skin daily at 10 pm. 15 mL 0  . insulin lispro (HUMALOG KWIKPEN) 100 UNIT/ML KiwkPen 3 times a day (just before each meal) 15-20-10 units 15 mL 11  . Insulin Pen Needle 32G X 4 MM MISC Use to  inject insulin daily 100 each 5  . Insulin Syringe-Needle U-100 (INSULIN SYRINGE .5CC/31GX5/16") 31G X 5/16" 0.5 ML MISC Use to inject insulin 100 each 5  . magnesium oxide (MAG-OX) 400 (241.3 Mg) MG tablet Take 1 tablet (400 mg total) by mouth daily. 30 tablet 6  . metFORMIN (GLUCOPHAGE-XR) 500 MG 24 hr tablet Take 4 tablets (2,000 mg total) by mouth daily with breakfast. 360 tablet 3  . methimazole (TAPAZOLE) 10 MG tablet Take 1 tablet (10 mg total) by mouth daily. 30 tablet 11  . metoprolol succinate (TOPROL-XL) 25 MG 24 hr tablet Take 1 tablet (25 mg total) by mouth 2 (two) times daily. 180 tablet 3  . nitroGLYCERIN (NITROSTAT) 0.4 MG SL tablet Place 1 tablet (0.4 mg total) under the tongue every 5 (five) minutes as needed for chest pain. 25 tablet 3  . pantoprazole (PROTONIX) 40 MG tablet Take 1 tablet (40 mg total) by mouth daily. 90 tablet 0  . silver sulfADIAZINE (SILVADENE) 1 % cream Apply 1 application topically daily. 50 g 1  . sotalol (BETAPACE) 120 MG tablet Take 1 tablet (120 mg total) by mouth every 12 (twelve) hours. 180 tablet 3  . SSD 1 % cream APPLY TO AFFECTED AREA ONCE DAILY 50 g 0  . traMADol (ULTRAM) 50 MG tablet Take 1 tablet (50 mg total) by mouth every 8 (eight) hours as needed. 90 tablet 5  . TRUEPLUS LANCETS 28G MISC 1 each by Does not apply route 2 (two) times daily. 100 each 2   No current facility-administered medications for this visit.     Allergies as of 09/20/2017 - Review Complete 09/20/2017  Allergen Reaction Noted  . Contrast media [iodinated diagnostic agents] Hives 08/07/2017  . Dilaudid [hydromorphone hcl] Other (See Comments) 05/19/2016  . Novolog [insulin aspart] Shortness Of Breath 02/27/2015  . Codeine Nausea And Vomiting 09/28/2013  . Iodine Other (See Comments) 01/30/2013  . Penicillins Itching, Rash, and Other (See Comments) 01/30/2013  . Propofol Other (See Comments) 11/26/2015  . Ace inhibitors Cough 03/13/2015  . Demerol [meperidine]  Nausea And Vomiting 08/15/2015  . Neosporin [neomycin-bacitracin zn-polymyx] Itching and Rash 01/30/2013  . Percocet [oxycodone-acetaminophen] Rash 08/15/2015  . Tape Itching and Rash 01/30/2013    Vitals: BP (!) 147/78   Pulse 71   Ht '5\' 4"'  (1.626 m)   Wt 178 lb (80.7 kg)   BMI 30.55 kg/m  Last Weight:  Wt Readings from Last 1 Encounters:  09/20/17 178 lb (80.7 kg)   DDU:KGUR mass index is 30.55 kg/m.     Last Height:   Ht Readings from Last 1 Encounters:  09/20/17 '5\' 4"'  (1.626  m)    Physical exam: nasal; congestion, hoarseness, clearing her throat repeatedly, coughing. lacquered tongue    General: The patient is awake, alert and appears not in acute distress. She is very hoarse. Head: Normocephalic Neck is supple. Mallampati 4 , goiter is noted.  neck circumference: 15.75". Nasal airflow restricted .  Cardiovascular:  irregular rate and rhythm , without murmurs or carotid bruit, and without distended neck veins. Respiratory: rhonchi.  Skin:  Without evidence of edema, or rash. Right foot - forefoot is amputated , healed after a delay of 12 month. Trunk: BMI is elevated. The patient's posture is erect. She wears special shoes with a brace.  Neurologic exam : The patient is awake and alert, oriented to place and time.  Attention span & concentration ability appears normal. Speech is fluent, with dysphonia or aphasia. Mood and affect are appropriate.  Cranial nerves: she noted olfactorial hypersensitivity- after years of decreased sense of smell.  Taste is intact.  Pupils are equal and briskly reactive to light. Funduscopic exam : cataract . Extraocular movements  in vertical and horizontal planes intact and without nystagmus.  Visual fields by finger perimetry are intact. Hearing to finger rub intact. Facial sensation intact to fine touch. Facial motor strength is symmetric and tongue and uvula move midline. Shoulder shrug was symmetrical.   Motor exam:   Normal tone, muscle  bulk and symmetric strength in upper extremities.   COORDINATION - tremor at rest and with action. Handwriting has not changed. No cog-wheeling.   Gait impaired due to  Right forefoot amputation.    The patient was advised of the nature of the diagnosed sleep disorder , the treatment options and risks for general a health and wellness arising from not treating the condition.  I spent more than 45 minutes of face to face time with the patient. Greater than 50% of time was spent in counseling and coordination of care. We have discussed the diagnosis and differential and I answered the patient's questions.     Assessment:  After physical and neurologic examination, review of laboratory studies,  Personal review of imaging studies, reports of other /same  Imaging studies , results of polysomnography/ neurophysiology testing and pre-existing records as far as provided in visit., my assessment is   1) Mrs. 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 She is known to have apnea this was witnessed during hospitalization as well as snoring. This has been known for several years.  She had a treatment for goiter reducing her previously large  neck circumference, still presents with a high-grade Mallampati.   I will order an attended sleep study for her to be performed as a split night polysomnography. She does not complain of headaches in the morning a very dry mouth or any discomfort but she does have nocturia.  Recurrent bronchitis Allergic asthma Chronic cough.  Nocturia.   Plan:  Treatment plan and additional workup : SPLIT at AHI 20, 4% and with CO2 measures in the first half of the study.  Please note CO2 peak in NREM and REM and time above 50 torr.   An attended sleep study is necessary as the patient also presents with intrinsic asthma-extrinsic asthma triggered by environmental allergies, recurrent bronchitis productive cough, nocturnal  witnessed apnea and snoring, hypoxemia and likely CO2 retention as documented by the nurses during her hospitalization.  She also has frequent nocturia which can be promoted by untreated sleep apnea, and she reported headaches  that do not necessarily wake her up from sleep but are present in the morning.  For this reason I have ordered a CO2 measures as well.  She recently suffered a myocardial infarction she reported, she has a history of atrial fibrillation and coronary artery disease.    Asencion Partridge Farid Grigorian MD  09/20/2017   CC:    Judith [name], [address] [place], [place] [id number]

## 2017-10-04 ENCOUNTER — Encounter: Payer: Self-pay | Admitting: Family Medicine

## 2017-10-04 ENCOUNTER — Emergency Department (HOSPITAL_COMMUNITY)
Admission: EM | Admit: 2017-10-04 | Discharge: 2017-10-05 | Disposition: A | Discharge status: Home / Self Care | Payer: Medicare PPO | Attending: Emergency Medicine | Admitting: Emergency Medicine

## 2017-10-04 ENCOUNTER — Emergency Department (HOSPITAL_COMMUNITY): Payer: Medicare PPO

## 2017-10-04 ENCOUNTER — Other Ambulatory Visit: Payer: Self-pay

## 2017-10-04 ENCOUNTER — Encounter (HOSPITAL_COMMUNITY): Payer: Self-pay

## 2017-10-04 DIAGNOSIS — I4891 Unspecified atrial fibrillation: Secondary | ICD-10-CM

## 2017-10-04 DIAGNOSIS — Z79899 Other long term (current) drug therapy: Secondary | ICD-10-CM | POA: Diagnosis not present

## 2017-10-04 DIAGNOSIS — I480 Paroxysmal atrial fibrillation: Secondary | ICD-10-CM | POA: Diagnosis not present

## 2017-10-04 DIAGNOSIS — J45909 Unspecified asthma, uncomplicated: Secondary | ICD-10-CM | POA: Insufficient documentation

## 2017-10-04 DIAGNOSIS — Z87891 Personal history of nicotine dependence: Secondary | ICD-10-CM | POA: Diagnosis not present

## 2017-10-04 DIAGNOSIS — Z794 Long term (current) use of insulin: Secondary | ICD-10-CM | POA: Diagnosis not present

## 2017-10-04 DIAGNOSIS — I10 Essential (primary) hypertension: Secondary | ICD-10-CM | POA: Insufficient documentation

## 2017-10-04 DIAGNOSIS — E119 Type 2 diabetes mellitus without complications: Secondary | ICD-10-CM | POA: Insufficient documentation

## 2017-10-04 DIAGNOSIS — I2510 Atherosclerotic heart disease of native coronary artery without angina pectoris: Secondary | ICD-10-CM | POA: Diagnosis not present

## 2017-10-04 DIAGNOSIS — R000 Tachycardia, unspecified: Secondary | ICD-10-CM | POA: Diagnosis not present

## 2017-10-04 LAB — CBC WITH DIFFERENTIAL/PLATELET
Abs Immature Granulocytes: 0.1 10*3/uL (ref 0.0–0.1)
BASOS ABS: 0.1 10*3/uL (ref 0.0–0.1)
Basophils Relative: 1 %
EOS PCT: 3 %
Eosinophils Absolute: 0.2 10*3/uL (ref 0.0–0.7)
HCT: 35.5 % — ABNORMAL LOW (ref 36.0–46.0)
HEMOGLOBIN: 12.1 g/dL (ref 12.0–15.0)
Immature Granulocytes: 1 %
LYMPHS PCT: 23 %
Lymphs Abs: 1.7 10*3/uL (ref 0.7–4.0)
MCH: 29.1 pg (ref 26.0–34.0)
MCHC: 34.1 g/dL (ref 30.0–36.0)
MCV: 85.3 fL (ref 78.0–100.0)
Monocytes Absolute: 0.6 10*3/uL (ref 0.1–1.0)
Monocytes Relative: 8 %
NEUTROS ABS: 4.7 10*3/uL (ref 1.7–7.7)
Neutrophils Relative %: 64 %
PLATELETS: 379 10*3/uL (ref 150–400)
RBC: 4.16 MIL/uL (ref 3.87–5.11)
RDW: 14.2 % (ref 11.5–15.5)
WBC: 7.3 10*3/uL (ref 4.0–10.5)

## 2017-10-04 LAB — BASIC METABOLIC PANEL
ANION GAP: 13 (ref 5–15)
BUN: 19 mg/dL (ref 6–20)
CALCIUM: 10 mg/dL (ref 8.9–10.3)
CO2: 25 mmol/L (ref 22–32)
CREATININE: 1.04 mg/dL — AB (ref 0.44–1.00)
Chloride: 95 mmol/L — ABNORMAL LOW (ref 101–111)
GFR calc Af Amer: >60 mL/min (ref >60)
GFR, EST NON AFRICAN AMERICAN: 55 mL/min — AB (ref >60)
GLUCOSE: 389 mg/dL — AB (ref 65–99)
Potassium: 4.1 mmol/L (ref 3.5–5.1)
Sodium: 133 mmol/L — ABNORMAL LOW (ref 135–145)

## 2017-10-04 LAB — I-STAT TROPONIN, ED: TROPONIN I, POC: 0.01 ng/mL (ref 0.00–0.08)

## 2017-10-04 LAB — MAGNESIUM: Magnesium: 1.3 mg/dL — ABNORMAL LOW (ref 1.7–2.4)

## 2017-10-04 MED ORDER — DILTIAZEM LOAD VIA INFUSION
10.0000 mg | Freq: Once | INTRAVENOUS | Status: AC
  Administered 2017-10-04: 10 mg via INTRAVENOUS
  Filled 2017-10-04: qty 10

## 2017-10-04 MED ORDER — ETOMIDATE 2 MG/ML IV SOLN
10.0000 mg | Freq: Once | INTRAVENOUS | Status: DC
  Filled 2017-10-04: qty 10

## 2017-10-04 MED ORDER — ONDANSETRON HCL 4 MG/2ML IJ SOLN
4.0000 mg | Freq: Once | INTRAMUSCULAR | Status: AC
  Administered 2017-10-04: 4 mg via INTRAVENOUS
  Filled 2017-10-04: qty 2

## 2017-10-04 MED ORDER — DILTIAZEM HCL-DEXTROSE 100-5 MG/100ML-% IV SOLN (PREMIX)
5.0000 mg/h | INTRAVENOUS | Status: DC
  Administered 2017-10-04: 5 mg/h via INTRAVENOUS
  Filled 2017-10-04: qty 100

## 2017-10-04 MED ORDER — FENTANYL CITRATE (PF) 100 MCG/2ML IJ SOLN
INTRAMUSCULAR | Status: AC | PRN
  Administered 2017-10-04: 25 ug via INTRAVENOUS

## 2017-10-04 MED ORDER — MAGNESIUM SULFATE 2 GM/50ML IV SOLN
2.0000 g | Freq: Once | INTRAVENOUS | Status: AC
Start: 2017-10-04 — End: 2017-10-04
  Administered 2017-10-04: 2 g via INTRAVENOUS
  Filled 2017-10-04: qty 50

## 2017-10-04 MED ORDER — ETOMIDATE 2 MG/ML IV SOLN
INTRAVENOUS | Status: AC | PRN
  Administered 2017-10-04: 10 mg via INTRAVENOUS

## 2017-10-04 MED ORDER — FENTANYL CITRATE (PF) 100 MCG/2ML IJ SOLN
25.0000 ug | Freq: Once | INTRAMUSCULAR | Status: DC
  Filled 2017-10-04: qty 2

## 2017-10-04 NOTE — ED Provider Notes (Addendum)
[facility] EMERGENCY DEPARTMENT Provider Note   CSN: [id number] Arrival date & time: 10/04/17  1405     History   Chief Complaint Chief Complaint  Patient presents with  . Tachycardia  . Chest Pain    HPI Judith [name] is a 67 y.o. female.  The history is provided by the patient. No language interpreter was used.  Chest Pain     Judith [name] is a 67 y.o. female who presents to the Emergency Department complaining of chest pain. She has a history of paroxysmal atrial fibrillation and about one half hours prior to ED arrival she developed severe, central chest pain with associated pressure around her neck with shortness of breath, nausea, vomiting. On EMS arrival she was noted to be in a fit with RVR with heart rates in the 170s. She received 10 mg of metoprolol in her central severe chest pain resolved but she had ongoing pressure around her neck and nausea. She has experienced multiple similar episodes in the past related to rapid atrial fibrillation. The only difference with this episode is that there was significant pain associated with it. Past Medical History:  Diagnosis Date  . Abnormal EKG 07/31/2013  . Arthritis    "hands" (03/06/2015)  . Asthma   . Carpal tunnel syndrome, bilateral   . Chronic kidney disease   . Complication of anesthesia    slow to wake up  . Coronary artery disease     2 v CAD with CTO of the RCA and high grade bifurcational LCx/OM stenosis. S/P PCI DES x 2 to the LCx/OM.  Marland Kitchen Diabetic peripheral neuropathy ([facility] [place]) "since 1996"  . GERD (gastroesophageal reflux disease)   . Goiter   . Headache    migraines prior to menopause  . History of shingles 06/01/2013  . Hyperlipidemia LDL goal <70 10/13/2015  . Hypertension   . Hyperthyroidism   . PAF (paroxysmal atrial fibrillation) ([place]) 04/29/2015   CHADS2VASC score of 4 now on Apixaban  . Pneumonia ~ 1976  . Tremors of nervous system   . Type II diabetes mellitus (H[facility])    insulin  dependent    Patient Active Problem List   Diagnosis Date Noted  . LGI bleed   . Acute blood loss anemia   . Wide-complex tachycardia ([place]) 07/04/2017  . Type II diabetes mellitus, uncontrolled ([place]) 07/04/2017  . Peripheral neuropathy 07/04/2017  . H/O hyperthyroidism 07/04/2017  . S/P transmetatarsal amputation of foot, right ([place]) 04/19/2016  . Obstructive sleep apnea 11/26/2015  . Bilateral carpal tunnel syndrome 11/26/2015  . Hypomagnesemia 11/16/2015  . Hyperlipidemia LDL goal <70 10/13/2015  . Abnormality of gait 09/02/2015  . Memory loss 08/12/2015  . Diabetic peripheral neuropathy ([place]) 08/12/2015  . Vitamin D deficiency 08/12/2015  . Hyperthyroidism 04/30/2015  . Heme positive stool   . Persistent atrial fibrillation ([place]) 04/29/2015  . Coronary artery disease with stable angina pectoris ([place]) 03/29/2015  . Abnormal nuclear stress test   . History of goiter 09/28/2014  . GERD (gastroesophageal reflux disease) 07/31/2013  . Depression with anxiety 06/01/2013  . DM2 (diabetes mellitus, type 2) ([place]) 01/30/2013  . HTN (hypertension) 01/30/2013  . Tobacco user 01/30/2013    Past Surgical History:  Procedure Laterality Date  . ABDOMINAL HYSTERECTOMY  1988   age 45; CERVICAL DYSPLASIA; ovaries intact.   . AMPUTATION Right 01/23/2016   Procedure: Right 3rd Ray Amputation;  Surgeon: [name], MD;  Location: [facility];  Service: Orthopedics;  Laterality: Right;  .  AMPUTATION Right 02/13/2016   Procedure: Right Transmetatarsal Amputation;  Surgeon: [name], MD;  Location: [facility];  Service: Orthopedics;  Laterality: Right;  . CARDIAC CATHETERIZATION N/A 02/27/2015   Procedure: Left Heart Cath and Coronary Angiography;  Surgeon: [name], MD; LAD 40%, mCFX 80%, OM 70%, RCA 100% calcified       . CARDIAC CATHETERIZATION N/A 03/06/2015   Procedure: Coronary Stent Intervention;  Surgeon: [name], MD;  Location: [facility];  Service: Cardiovascular;   Laterality: N/A;  Mid CX 3.50x12 promus DES w/ 0% resdual and Prox OM1 2.50x20 promus DES w/ 20% residual  . CARDIOVERSION    . CARPAL TUNNEL RELEASE Right Nov 2015  . CARPAL TUNNEL RELEASE Right 1992; 05/2014   [name]; [place]  . CESAREAN SECTION  1982; 1984  . FOOT NEUROMA SURGERY Bilateral 2000  . KNEE ARTHROSCOPY Right ~ 2003   "meniscus repair"  . SHOULDER OPEN ROTATOR CUFF REPAIR Right 1996; 1998   "w/fracture repair"  . THYROID SURGERY  2000   "removed lots of nodules"  . TONSILLECTOMY  1976     OB History   None      Home Medications    Prior to Admission medications   Medication Sig Start Date End Date Taking? Authorizing Provider  albuterol (PROVENTIL HFA) 108 (90 Base) MCG/ACT inhaler Inhale 1-2 puffs into the lungs every 6 (six) hours as needed for wheezing or shortness of breath. 11/07/15  Yes [name], MD  albuterol (PROVENTIL) (2.5 MG/3ML) 0.083% nebulizer solution Take 3 mLs (2.5 mg total) by nebulization every 6 (six) hours as needed for wheezing or shortness of breath. 08/31/17  Yes [name], PA-C  amLODipine (NORVASC) 10 MG tablet Take 1 tablet (10 mg total) by mouth daily. 08/22/17  Yes Turner, Eber Hong, MD  atorvastatin (LIPITOR) 20 MG tablet Take 1 tablet (20 mg total) by mouth daily. 08/22/17  Yes Turner, Eber Hong, MD  benzonatate (TESSALON) 100 MG capsule Take 100-200 mg by mouth 3 (three) times daily as needed for cough.   Yes [provider]  buPROPion [facility] SR) 150 MG 12 hr tablet TAKE ONE TABLET BY MOUTH TWICE DAILY 03/16/17  Yes [name], PA-C  fenofibrate (TRICOR) 145 MG tablet Take 1 tablet (145 mg total) by mouth daily. 08/22/17 08/17/18 Yes Turner, Eber Hong, MD  FLUoxetine (PROZAC) 20 MG capsule Take 1 capsule (20 mg total) by mouth at bedtime. 08/31/17  Yes [name], PA-C  gabapentin (NEURONTIN) 300 MG capsule Take 3 capsules (900 mg total) by mouth 2 (two) times daily. TAKE 3 CAPSULES BY MOUTH EVERY  MORNING AND  3 CAPSULES AT BEDTIME 08/31/17  Yes [name], PA-C  insulin degludec (TRESIBA FLEXTOUCH) 100 UNIT/ML SOPN FlexTouch Pen Inject 0.2 mLs (20 Units total) into the skin daily at 10 pm. 07/05/17  Yes Mikhail, Velta Addison, DO  insulin lispro (HUMALOG KWIKPEN) 100 UNIT/ML KiwkPen 3 times a day (just before each meal) 15-20-10 units Patient taking differently: Inject 10-20 Units into the skin See admin instructions. 15 units in the morning, 20 units at lunch, and 10 units at bedtime 09/05/17  Yes Renato Shin, MD  magnesium oxide (MAG-OX) 400 (241.3 Mg) MG tablet Take 1 tablet (400 mg total) by mouth daily. 08/11/17  Yes Duke, Tami Lin, PA  methimazole (TAPAZOLE) 10 MG tablet Take 1 tablet (10 mg total) by mouth daily. 08/25/17  Yes Renato Shin, MD  metoprolol succinate (TOPROL-XL) 25 MG 24  hr tablet Take 1 tablet (25 mg total) by mouth 2 (two) times daily. 08/22/17  Yes Turner, Eber Hong, MD  nitroGLYCERIN (NITROSTAT) 0.4 MG SL tablet Place 1 tablet (0.4 mg total) under the tongue every 5 (five) minutes as needed for chest pain. 02/10/17  Yes [name], PA-C  pantoprazole (PROTONIX) 40 MG tablet Take 1 tablet (40 mg total) by mouth daily. 08/31/17  Yes [name], PA-C  sotalol (BETAPACE) 120 MG tablet Take 1 tablet (120 mg total) by mouth every 12 (twelve) hours. 08/22/17  Yes Turner, Eber Hong, MD  traMADol (ULTRAM) 50 MG tablet Take 1 tablet (50 mg total) by mouth every 8 (eight) hours as needed. 08/31/17  Yes [name], PA-C  Alcohol Swabs (B-D SINGLE USE SWABS REGULAR) PADS 1 each by Does not apply route 2 (two) times daily. 08/29/17   Renato Shin, MD  apixaban (ELIQUIS) 5 MG TABS tablet Take 1 tablet (5 mg total) by mouth 2 (two) times daily. 08/17/17   Sueanne Margarita, MD  Blood Glucose Calibration (TRUE METRIX LEVEL 1) Low SOLN 1 each by In Vitro route as needed. 08/25/17   Renato Shin, MD  Blood Glucose Monitoring Suppl (TRUE METRIX AIR GLUCOSE METER) w/Device KIT 1 each by Does not  apply route daily. 08/25/17   Renato Shin, MD  glucose blood (TRUE METRIX BLOOD GLUCOSE TEST) test strip Used to check blood sugars 2x daily. 08/25/17   Renato Shin, MD  Insulin Pen Needle 32G X 4 MM MISC Use to inject insulin daily 08/13/16   Renato Shin, MD  Insulin Syringe-Needle U-100 (INSULIN SYRINGE .5CC/31GX5/16") 31G X 5/16" 0.5 ML MISC Use to inject insulin 08/13/16   Renato Shin, MD  metFORMIN (GLUCOPHAGE-XR) 500 MG 24 hr tablet Take 4 tablets (2,000 mg total) by mouth daily with breakfast. Patient taking differently: Take 1,000 mg by mouth 2 (two) times daily.  08/25/17   Renato Shin, MD  silver sulfADIAZINE (SILVADENE) 1 % cream Apply 1 application topically daily. Patient not taking: Reported on 10/04/2017 07/01/17   Trula Slade, DPM  SSD 1 % cream APPLY TO AFFECTED AREA ONCE DAILY 03/11/17   [name], MD  TRUEPLUS LANCETS 28G MISC 1 each by Does not apply route 2 (two) times daily. 08/29/17   Renato Shin, MD    Family History Family History  Problem Relation Age of Onset  . Cancer Mother 60       bronchial cancer  . Breast cancer Mother   . Lung cancer Mother   . Hypertension Father   . COPD Father   . Heart disease Father 69       CAD with cardiac stenting  . Heart attack Father   . Parkinson's disease Father   . Allergies Sister   . Breast cancer Maternal Grandmother   . Emphysema Maternal Grandfather   . Leukemia Paternal Grandmother   . Emphysema Paternal Grandfather   . Thyroid disease Neg Hx     Social History Social History   Tobacco Use  . Smoking status: Former Smoker    Packs/day: 0.00    Years: 41.00    Pack years: 0.00    Types: Cigarettes    Last attempt to quit: 03/05/2015    Years since quitting: 2.5  . Smokeless tobacco: Never Used  . Tobacco comment: 04/29/2015 "quit smoking cigarettes 02/27/2015"  Substance Use Topics  . Alcohol use: No  . Drug use: No     Allergies  Contrast media [iodinated diagnostic agents];  Dilaudid [hydromorphone hcl]; Novolog [insulin aspart]; Codeine; Iodine; Penicillins; Propofol; Ace inhibitors; Demerol [meperidine]; Neosporin [neomycin-bacitracin zn-polymyx]; Percocet [oxycodone-acetaminophen]; and Tape   Review of Systems Review of Systems  Cardiovascular: Positive for chest pain.  All other systems reviewed and are negative.    Physical Exam Updated Vital Signs BP (!) 152/130   Pulse (!) 129   Resp 15   Ht '5\' 4"'  (1.626 m)   Wt 80.7 kg (178 lb)   SpO2 99%   BMI 30.55 kg/m   Physical Exam  Constitutional: She is oriented to person, place, and time. She appears well-developed and well-nourished.  HENT:  Head: Normocephalic and atraumatic.  Cardiovascular:  No murmur heard. Tachycardic and irregular  Pulmonary/Chest: Effort normal and breath sounds normal. No respiratory distress.  Abdominal: Soft. There is no tenderness. There is no rebound and no guarding.  Musculoskeletal: She exhibits no edema or tenderness.  Neurological: She is alert and oriented to person, place, and time.  Skin: Skin is warm. She is diaphoretic.  Psychiatric: She has a normal mood and affect. Her behavior is normal.  Nursing note and vitals reviewed.    ED Treatments / Results  Labs (all labs ordered are listed, but only abnormal results are displayed) Labs Reviewed  CBC WITH DIFFERENTIAL/PLATELET - Abnormal; Notable for the following components:      Result Value   HCT 35.5 (*)    All other components within normal limits  BASIC METABOLIC PANEL - Abnormal; Notable for the following components:   Sodium 133 (*)    Chloride 95 (*)    Glucose, Bld 389 (*)    Creatinine, Ser 1.04 (*)    GFR calc non Af Amer 55 (*)    All other components within normal limits  MAGNESIUM  I-STAT TROPONIN, ED    EKG EKG Interpretation  Date/Time:  Tuesday Oct 04 2017 14:11:16 EDT Ventricular Rate:  155 PR Interval:    QRS Duration: 89 QT Interval:  321 QTC Calculation: 516 R  Axis:   -55 Text Interpretation:  Atrial fibrillation Left anterior fascicular block Anteroseptal infarct, old ST depression, probably rate related Prolonged QT interval Confirmed by Quintella Reichert [phone]) on 10/04/2017 2:13:37 PM   Radiology Dg Chest Port 1 View  Result Date: 10/04/2017 CLINICAL DATA:  Tachycardia EXAM: PORTABLE CHEST 1 VIEW COMPARISON:  07/30/2017 FINDINGS: The heart size and mediastinal contours are within normal limits. Both lungs are clear. The visualized skeletal structures are unremarkable. IMPRESSION: No active disease. Electronically Signed   By: Inez Catalina M.D.   On: 10/04/2017 14:29    Procedures Procedures (including critical care time) CRITICAL CARE Performed by: Quintella Reichert   Total critical care time: 30 minutes  Critical care time was exclusive of separately billable procedures and treating other patients.  Critical care was necessary to treat or prevent imminent or life-threatening deterioration.  Critical care was time spent personally by me on the following activities: development of treatment plan with patient and/or surrogate as well as nursing, discussions with consultants, evaluation of patient's response to treatment, examination of patient, obtaining history from patient or surrogate, ordering and performing treatments and interventions, ordering and review of laboratory studies, ordering and review of radiographic studies, pulse oximetry and re-evaluation of patient's condition.  Medications Ordered in ED Medications  ondansetron (ZOFRAN) injection 4 mg (4 mg Intravenous Given 10/04/17 1531)     Initial Impression / Assessment and Plan / ED Course  I have reviewed the  triage vital signs and the nursing notes.  Pertinent labs & imaging results that were available during my care of the patient were reviewed by me and considered in my medical decision making (see chart for details).    patient with history of a fib on anticoagulation here  for evaluation of chest pain and palpitations similar to prior episodes of rapid a fib. She did have severe central chest pain but this did improve after metoprolol administration by EMS and her heart rate did improve from 170s to 130s. BMP does demonstrate mild hyperglycemia, mild elevation and creatinine. Patient care transferred pending magnesium level.   CHA2DS2/VAS Stroke Risk Points  Current as of 3 hours ago     5 >= 2 Points: High Risk  1 - 1.99 Points: Medium Risk  0 Points: Low Risk    The patient's score has not changed in the past year.:  No Change     Details    This score determines the patient's risk of having a stroke if the  patient has atrial fibrillation.       Points Metrics  0 Has Congestive Heart Failure:  No    Current as of 3 hours ago  1 Has Vascular Disease:  Yes    Current as of 3 hours ago  1 Has Hypertension:  Yes    Current as of 3 hours ago  1 Age:  38    Current as of 3 hours ago  1 Has Diabetes:  Yes    Current as of 3 hours ago  0 Had Stroke:  No  Had TIA:  No  Had thromboembolism:  No    Current as of 3 hours ago  1 Female:  Yes    Current as of 3 hours ago           Final Clinical Impressions(s) / ED Diagnoses   Final diagnoses:  Atrial fibrillation with RVR [facility])    ED Discharge Orders    None       Quintella Reichert, MD 10/04/17 1712    Quintella Reichert, MD 10/06/17 1455    Quintella Reichert, MD 10/11/17 1614

## 2017-10-04 NOTE — Discharge Instructions (Addendum)
You were in atrial fibrillation today and we converted to with defibrillation.  You tolerated this well.  Please follow-up with your PCP and cardiologist for further management.  Please stay hydrated.  If any symptoms change or worsen, please return to the nearest emergency department.  Please continue your anticoagulation.

## 2017-10-04 NOTE — ED Triage Notes (Signed)
Pt arrives [facility] ems from home with c/o rapid hr. Hx of AFib requiring cardioversion. States she had"mini heart attack" in April. C/o nausea and dizziness. No vomiting chest pain is 2/10.

## 2017-10-04 NOTE — ED Provider Notes (Signed)
Physical Exam  BP (!) 179/87 (BP Location: Right Arm)   Pulse 64   Resp 13   Ht 5\' 4"  (1.626 m)   Wt 80.7 kg (178 lb)   SpO2 97%   BMI 30.55 kg/m    ED Course/Procedures     .Cardioversion Date/Time: 10/04/2017 11:51 PM Performed by: Courtney Paris, MD Authorized by: Courtney Paris, MD   Consent:    Consent obtained:  Verbal   Consent given by:  Patient   Risks discussed:  Cutaneous burn, death, induced arrhythmia and pain   Alternatives discussed:  No treatment Pre-procedure details:    Cardioversion basis:  Emergent   Rhythm:  Atrial fibrillation   Electrode placement:  Anterior-posterior Patient sedated: Yes. Refer to sedation procedure documentation for details of sedation.  Attempt one:    Cardioversion mode:  Synchronous   Waveform:  Biphasic   Shock (Joules):  200   Shock outcome:  Conversion to normal sinus rhythm Post-procedure details:    Patient status:  Awake   Patient tolerance of procedure:  Tolerated well, no immediate complications .Sedation Date/Time: 10/04/2017 11:52 PM Performed by: Courtney Paris, MD Authorized by: Courtney Paris, MD   Consent:    Consent obtained:  Verbal   Consent given by:  Patient   Risks discussed:  Allergic reaction, prolonged hypoxia resulting in organ damage, nausea, vomiting, inadequate sedation and dysrhythmia Universal protocol:    Procedure explained and questions answered to patient or proxy's satisfaction: yes     Relevant documents present and verified: yes     Test results available and properly labeled: yes     Imaging studies available: yes     Immediately prior to procedure a time out was called: yes   Indications:    Procedure performed:  Cardioversion Pre-sedation assessment:    Time since last food or drink:  9 hours   ASA classification: class 2 - patient with mild systemic disease     Neck mobility: normal     Mouth opening:  3 or more finger widths   Thyromental  distance:  4 finger widths   Mallampati score:  I - soft palate, uvula, fauces, pillars visible   Pre-sedation assessments completed and reviewed: respiratory function not reviewed   Immediate pre-procedure details:    Reassessment: Patient reassessed immediately prior to procedure     Reviewed: vital signs     Verified: bag valve mask available, emergency equipment available, intubation equipment available, IV patency confirmed, oxygen available and suction available   Procedure details (see MAR for exact dosages):    Preoxygenation:  Nasal cannula   Total Provider sedation time (minutes):  30     MDM   Care assumed from Dr. Ralene Bathe.   At time of transfer care, patient is awaiting reassessment after magnesium supplementation through IV and diltiazem drip.  Patient is in A. fib.  Patient has been taking her blood thinners as directed.  Plan of care is to cardiovert her as she has done several times in the past if she does not self convert during magnesium repletion.  Patient remained in A. fib.  Decision made to cardiovert patient.  Cardioversion occurred without difficulty with 1 defibrillation.  Patient was observed for period of time and had mild nausea but otherwise had no comp occasions.  Patient remained in sinus rhythm afterwards and felt safe for discharge home.  Patient discharged in good condition after conversion.  Clinical Impression: 1. Atrial fibrillation with RVR ([place])  Disposition: Discharge  Condition: Good  I have discussed the results, Dx and Tx plan with the pt(& family if present). He/she/they expressed understanding and agree(s) with the plan. Discharge instructions discussed at great length. Strict return precautions discussed and pt &/or family have verbalized understanding of the instructions. No further questions at time of discharge.    New Prescriptions   No medications on file    Follow Up: [name], MD [place]  [id number] [phone]     [place] [address] [id number] mc [place]                      T[place]eler, Gwenyth Allegra, MD 10/05/17 0005

## 2017-10-04 NOTE — ED Notes (Signed)
Consent for cardioversion signed and at bedside

## 2017-10-06 ENCOUNTER — Telehealth (HOSPITAL_COMMUNITY): Payer: Self-pay | Admitting: *Deleted

## 2017-10-06 NOTE — Telephone Encounter (Signed)
LMOM for pt to [facility]bk to sched appt.  Pt dccv in ED

## 2017-10-07 ENCOUNTER — Encounter: Payer: Self-pay | Admitting: Physician Assistant

## 2017-10-07 ENCOUNTER — Ambulatory Visit: Payer: Medicare PPO | Admitting: Physician Assistant

## 2017-10-07 ENCOUNTER — Other Ambulatory Visit: Payer: Self-pay

## 2017-10-07 DIAGNOSIS — Z91048 Other nonmedicinal substance allergy status: Secondary | ICD-10-CM | POA: Diagnosis not present

## 2017-10-07 DIAGNOSIS — G894 Chronic pain syndrome: Secondary | ICD-10-CM | POA: Diagnosis not present

## 2017-10-07 MED ORDER — TRIAMCINOLONE ACETONIDE 0.1 % EX CREA: 1.0000 "application " | TOPICAL_CREAM | Freq: Two times a day (BID) | CUTANEOUS | 0 refills | Status: DC

## 2017-10-07 MED ORDER — BUPROPION HCL 75 MG PO TABS: 75.0000 mg | ORAL_TABLET | Freq: Four times a day (QID) | ORAL | 3 refills | Status: DC

## 2017-10-07 MED ORDER — MAGNESIUM OXIDE 400 (241.3 MG) MG PO TABS: 400.0000 mg | ORAL_TABLET | Freq: Every day | ORAL | 6 refills | Status: DC

## 2017-10-07 NOTE — Progress Notes (Signed)
  10/07/2017 1:50 PM   DOB: 09/01/1950 / MRN: [id number]  SUBJECTIVE:  Judith [name] is a 67 y.o. female presenting for ED follow up for which she was seen there for cardioversion.  As it turns out, patient had stopped taking her magnesium about 1 month after seeing cardiology.  She was unaware that hypomagnesia could result in arrhythmia.  She was cardioverted in the emergency room and tells me she immediately felt better and was discharged home.  The [facility] did call her this morning and she plans to call them back today to establish a follow-up.   She is having a skin reaction to the adhesive that was used for the cardioversion pads.  She would like some medication for this and has used triamcinolone in the past.  She has a financial issue with the current Wellbutrin medication that she takes.  She says it cost her $75 for 1 month and this is unaffordable for her.  She would like to come up with a more affordable option if possible.  She takes gabapentin, Wellbutrin SR, Prozac 20 and tramadol for history of chronic pain.  This plan was formulated by her provider in [place] some years ago for Lyrica and duloxetine became unaffordable for her.  She tells me since she is been on this pain regimen she has very little peripheral neuropathy pain.  She is allergic to contrast media [iodinated diagnostic agents]; dilaudid [hydromorphone hcl]; novolog [insulin aspart]; codeine; iodine; penicillins; propofol; ace inhibitors; demerol [meperidine]; neosporin [neomycin-bacitracin zn-polymyx]; percocet [oxycodone-acetaminophen]; and tape.   She  has a past medical history of Abnormal EKG (07/31/2013), Arthritis, Asthma, Carpal tunnel syndrome, bilateral, Chronic kidney disease, Complication of anesthesia, Coronary artery disease, Diabetic peripheral neuropathy (HCC) ("since 1996"), GERD (gastroesophageal reflux disease), Goiter, Headache, History of shingles (06/01/2013), Hyperlipidemia LDL goal <70  (10/13/2015), Hypertension, Hyperthyroidism, PAF (paroxysmal atrial fibrillation) (HCC) (04/29/2015), Pneumonia (~ 1976), Tremors of nervous system, and Type II diabetes mellitus (HCC).    She  reports that she quit smoking about 2 years ago. Her smoking use included cigarettes. She smoked 0.00 packs per day for 41.00 years. She has never used smokeless tobacco. She reports that she does not drink alcohol or use drugs. She  reports that she does not currently engage in sexual activity. She reports using the following methods of birth control/protection: Post-menopausal and Surgical. 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  Her family history includes Allergies in her sister; Breast cancer in her maternal grandmother and mother; COPD in her father; Cancer (age of onset: 59) in her mother; Emphysema in her maternal grandfather and paternal grandfather; Heart attack in her father; Heart disease (age of onset: 60) in her father; Hypertension in her father; Leukemia in her paternal grandmother; Lung cancer in her mother; Parkinson's disease in her father.  ROS  The problem list and medications were reviewed and updated by myself where necessary and exist elsewhere in the encounter.   OBJECTIVE:  BP 138/84 (BP Location: Right Arm, Patient Position: Sitting, Cuff Size: Normal)   Pulse 80   Temp 98.4 F (36.9 C) (Oral)   Resp 18     Ht 5' 4" (1.626 m)   Wt 173 lb (78.5 kg)   SpO2 98%   BMI 29.70 kg/m   Wt  Readings from Last 3 Encounters:  10/07/17 173 lb (78.5 kg)  10/04/17 178 lb (80.7 kg)  09/20/17 178 lb (80.7 kg)     Physical Exam  Constitutional: She is oriented to person, place, and time. She appears well-nourished. No distress.  Eyes: Pupils are equal, round, and reactive to light. EOM are normal.  Cardiovascular: Normal rate, regular rhythm, S1 normal, S2 normal, normal heart sounds and intact distal pulses. Exam reveals no gallop, no friction rub and no decreased pulses.  No murmur heard. Pulmonary/Chest: Effort normal. No stridor. No respiratory distress. She has no wheezes. She has no rales.  Abdominal: She exhibits no distension.  Musculoskeletal: She exhibits no edema.  Neurological: She is alert and oriented to person, place, and time. No cranial nerve deficit. Gait normal.  Skin: Skin is dry. She is not diaphoretic.  Psychiatric: She has a normal mood and affect.  Vitals reviewed.   Results for orders placed or performed during the hospital encounter of 10/04/17 (from the past 72 hour(s))  CBC with Differential     Status: Abnormal   Collection Time: 10/04/17  2:35 PM  Result Value Ref Range   WBC 7.3 4.0 - 10.5 K/uL   RBC 4.16 3.87 - 5.11 MIL/uL   Hemoglobin 12.1 12.0 - 15.0 g/dL   HCT 35.5 (L) 36.0 - 46.0 %   MCV 85.3 78.0 - 100.0 fL   MCH 29.1 26.0 - 34.0 pg   MCHC 34.1 30.0 - 36.0 g/dL   RDW 14.2 11.5 - 15.5 %   Platelets 379 150 - 400 K/uL   Neutrophils Relative % 64 %   Neutro Abs 4.7 1.7 - 7.7 K/uL   Lymphocytes Relative 23 %   Lymphs Abs 1.7 0.7 - 4.0 K/uL   Monocytes Relative 8 %   Monocytes Absolute 0.6 0.1 - 1.0 K/uL   Eosinophils Relative 3 %   Eosinophils Absolute 0.2 0.0 - 0.7 K/uL   Basophils Relative 1 %   Basophils Absolute 0.1 0.0 - 0.1 K/uL   Immature Granulocytes 1 %   Abs Immature Granulocytes 0.1 0.0 - 0.1 K/uL    Comment: Performed at [facility], [address], [place], [place] [address]  Basic metabolic panel     Status:  Abnormal   Collection Time: 10/04/17  2:35 PM  Result Value Ref Range   Sodium 133 (L) 135 - 145 mmol/L   Potassium 4.1 3.5 - 5.1 mmol/L   Chloride 95 (L) 101 - 111 mmol/L   CO2 25 22 - 32 mmol/L   Glucose, Bld 389 (H) 65 - 99 mg/dL   BUN 19 6 - 20 mg/dL   Creatinine, Ser 1.04 (H) 0.44 - 1.00 mg/dL   Calcium 10.0 8.9 - 10.3 mg/dL   GFR calc non Af Amer 55 (L) >60 mL/min   GFR calc Af Amer >60 >60 mL/min    Comment: (NOTE) The eGFR has been calculated using the CKD EPI equation. This calculation has not been validated in all clinical situations. eGFR's persistently <60 mL/min signify possible Chronic Kidney Disease.    Anion gap 13 5 - 15    Comment: Performed at [facility] [address], [place], [place] [address]  I-stat troponin, ED     Status: None   Collection Time: 10/04/17  2:45 PM  Result Value  Ref Range   Troponin i, poc 0.01 0.00 - 0.08 ng/mL   Comment 3            Comment: Due to the release kinetics of cTnI, a negative result within the first hours of the onset of symptoms does not rule out myocardial infarction with certainty. If myocardial infarction is still suspected, repeat the test at appropriate intervals.   Magnesium     Status: Abnormal   Collection Time: 10/04/17  5:16 PM  Result Value Ref Range   Magnesium 1.3 (L) 1.7 - 2.4 mg/dL    Comment: Performed at [facility] [address], [place], [place] [address]    No results found.  ASSESSMENT AND PLAN:  Aurielle was seen today for hospitalization follow-up.  Diagnoses and all orders for this visit:  Hypomagnesemia: I reviewed the chart it appears that Dr. Oval Linsey and cardiology fully intend to keep her on magnesium.  I am refilling this today and sending this to patient's mail order pharmacy and she has a remaining prescription at [facility] and she will refill and start back on today. -     magnesium oxide (MAG-OX) 400 (241.3 Mg) MG tablet; Take 1 tablet (400 mg total) by mouth  daily.  Chronic pain syndrome: It appears her insurance does not pay for Wellbutrin SR.  I am starting her on Wellbutrin 75 mg every 6 hours which is an equivalent dose -     buPROPion (WELLBUTRIN) 75 MG tablet; Take 1 tablet (75 mg total) by mouth every 6 (six) hours.  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    The patient is advised to call or return to clinic if she does not see an improvement in symptoms, or to seek the care of the closest emergency department if she worsens with the above plan.   Philis Fendt, MHS, PA-C [facility] 10/07/2017 1:50 PM

## 2017-10-07 NOTE — Patient Instructions (Addendum)
[employer] is mag oxide and triamcinolone, wellbutrin.   Pick up Mag oxide at [facility] call afib.     IF you received an x-ray today, you will receive an invoice from [facility]. Please contact [facility] at [phone] with questions or concerns regarding your invoice.   IF you received labwork today, you will receive an invoice from [facility]. Please contact [facility] at [phone] with questions or concerns regarding your invoice.   Our billing staff will not be able to assist you with questions regarding bills from these companies.  You will be contacted with the lab results as soon as they are available. The fastest way to get your results is to activate your My Chart account. Instructions are located on the last page of this paperwork. If you have not heard from [place] regarding the results in 2 weeks, please contact this office.

## 2017-10-13 DIAGNOSIS — H2512 Age-related nuclear cataract, left eye: Secondary | ICD-10-CM | POA: Diagnosis not present

## 2017-10-13 DIAGNOSIS — H25812 Combined forms of age-related cataract, left eye: Secondary | ICD-10-CM | POA: Diagnosis not present

## 2017-10-21 ENCOUNTER — Encounter: Payer: Self-pay | Admitting: Physician Assistant

## 2017-10-21 ENCOUNTER — Ambulatory Visit: Payer: Medicare PPO | Admitting: Physician Assistant

## 2017-10-21 VITALS — BP 150/74 | HR 80 | Temp 98.8°F | Resp 16 | Ht 64.0 in | Wt 181.4 lb

## 2017-10-21 DIAGNOSIS — M542 Cervicalgia: Secondary | ICD-10-CM

## 2017-10-21 DIAGNOSIS — H6993 Unspecified Eustachian tube disorder, bilateral: Secondary | ICD-10-CM | POA: Diagnosis not present

## 2017-10-21 NOTE — Patient Instructions (Addendum)
For ear discomfort, this is likely ETD. Continue zyrtec daily, start nasacort as prescribed, and start over the counter nasal saline rinses at night time. For neck tightness, start doing stretches below daily, advance them as tolerated. Use heating pad to affected area 4-5 x a day. If you can, get a massage, this will really help! Avoid ice on neck. Return to clinic if symptoms worsen, do not improve in 5-7 days, or as needed.    Eustachian Tube Dysfunction The eustachian tube connects the middle ear to the back of the nose. It regulates air pressure in the middle ear by allowing air to move between the ear and nose. It also helps to drain fluid from the middle ear space. When the eustachian tube does not function properly, air pressure, fluid, or both can build up in the middle ear. Eustachian tube dysfunction can affect one or both ears. What are the causes? This condition happens when the eustachian tube becomes blocked or cannot open normally. This may result from:  Ear infections.  Colds and other upper respiratory infections.  Allergies.  Irritation, such as from cigarette smoke or acid from the stomach coming up into the esophagus (gastroesophageal reflux).  Sudden changes in air pressure, such as from descending in an airplane.  Abnormal growths in the nose or throat, such as nasal polyps, tumors, or enlarged tissue at the back of the throat (adenoids).  What increases the risk? This condition may be more likely to develop in people who smoke and people who are overweight. Eustachian tube dysfunction may also be more likely to develop in children, especially children who have:  Certain birth defects of the mouth, such as cleft palate.  Large tonsils and adenoids.  What are the signs or symptoms? Symptoms of this condition may include:  A feeling of fullness in the ear.  Ear pain.  Clicking or popping noises in the ear.  Ringing in the ear.  Hearing loss.  Loss of  balance.  Symptoms may get worse when the air pressure around you changes, such as when you travel to an area of high elevation or fly on an airplane. How is this diagnosed? This condition may be diagnosed based on:  Your symptoms.  A physical exam of your ear, nose, and throat.  Tests, such as those that measure: ? The movement of your eardrum (tympanogram). ? Your hearing (audiometry).  How is this treated? Treatment depends on the cause and severity of your condition. If your symptoms are mild, you may be able to relieve your symptoms by moving air into ("popping") your ears. If you have symptoms of fluid in your ears, treatment may include:  Decongestants.  Antihistamines.  Nasal sprays or ear drops that contain medicines that reduce swelling (steroids).  In some cases, you may need to have a procedure to drain the fluid in your eardrum (myringotomy). In this procedure, a small tube is placed in the eardrum to:  Drain the fluid.  Restore the air in the middle ear space.  Follow these instructions at home:  Take over-the-counter and prescription medicines only as told by your health care provider.  Use techniques to help pop your ears as recommended by your health care provider. These may include: ? Chewing gum. ? Yawning. ? Frequent, forceful swallowing. ? Closing your mouth, holding your nose closed, and gently blowing as if you are trying to blow air out of your nose.  Do not do any of the following until your health care  provider approves: ? Travel to high altitudes. ? Fly in airplanes. ? Work in a Pension scheme manager or room. ? Scuba dive.  Keep your ears dry. Dry your ears completely after showering or bathing.  Do not smoke.  Keep all follow-up visits as told by your health care provider. This is important. Contact a health care provider if:  Your symptoms do not go away after treatment.  Your symptoms come back after treatment.  You are unable to pop  your ears.  You have: ? A fever. ? Pain in your ear. ? Pain in your head or neck. ? Fluid draining from your ear.  Your hearing suddenly changes.  You become very dizzy.  You lose your balance. This information is not intended to replace advice given to you by your health care provider. Make sure you discuss any questions you have with your health care provider. Document Released: 05/23/2015 Document Revised: 10/02/2015 Document Reviewed: 05/15/2014 Elsevier Interactive Patient Education  2018 [employer].  Cervical Strain and Sprain Rehab Ask your health care provider which exercises are safe for you. Do exercises exactly as told by your health care provider and adjust them as directed. It is normal to feel mild stretching, pulling, tightness, or discomfort as you do these exercises, but you should stop right away if you feel sudden pain or your pain gets worse.Do not begin these exercises until told by your health care provider. Stretching and range of motion exercises These exercises warm up your muscles and joints and improve the movement and flexibility of your neck. These exercises also help to relieve pain, numbness, and tingling. Exercise A: Cervical side bend  1. Using good posture, sit on a stable chair or stand up. 2. Without moving your shoulders, slowly tilt your left / right ear to your shoulder until you feel a stretch in your neck muscles. You should be looking straight ahead. 3. Hold for __________ seconds. 4. Repeat with the other side of your neck. Repeat __________ times. Complete this exercise __________ times a day. Exercise B: Cervical rotation  1. Using good posture, sit on a stable chair or stand up. 2. Slowly turn your head to the side as if you are looking over your left / right shoulder. ? Keep your eyes level with the ground. ? Stop when you feel a stretch along the side and the back of your neck. 3. Hold for __________ seconds. 4. Repeat this by  turning to your other side. Repeat __________ times. Complete this exercise __________ times a day. Exercise C: Thoracic extension and pectoral stretch 1. Roll a towel or a small blanket so it is about 4 inches (10 cm) in diameter. 2. Lie down on your back on a firm surface. 3. Put the towel lengthwise, under your spine in the middle of your back. It should not be not under your shoulder blades. The towel should line up with your spine from your middle back to your lower back. 4. Put your hands behind your head and let your elbows fall out to your sides. 5. Hold for __________ seconds. Repeat __________ times. Complete this exercise __________ times a day. Strengthening exercises These exercises build strength and endurance in your neck. Endurance is the ability to use your muscles for a long time, even after your muscles get tired. Exercise D: Upper cervical flexion, isometric 1. Lie on your back with a thin pillow behind your head and a small rolled-up towel under your neck. 2. Gently tuck your chin  toward your chest and nod your head down to look toward your feet. Do not lift your head off the pillow. 3. Hold for __________ seconds. 4. Release the tension slowly. Relax your neck muscles completely before you repeat this exercise. Repeat __________ times. Complete this exercise __________ times a day. Exercise E: Cervical extension, isometric  1. Stand about 6 inches (15 cm) away from a wall, with your back facing the wall. 2. Place a soft object, about 6-8 inches (15-20 cm) in diameter, between the back of your head and the wall. A soft object could be a small pillow, a ball, or a folded towel. 3. Gently tilt your head back and press into the soft object. Keep your jaw and forehead relaxed. 4. Hold for __________ seconds. 5. Release the tension slowly. Relax your neck muscles completely before you repeat this exercise. Repeat __________ times. Complete this exercise __________ times a  day. Posture and body mechanics  Body mechanics refers to the movements and positions of your body while you do your daily activities. Posture is part of body mechanics. Good posture and healthy body mechanics can help to relieve stress in your body's tissues and joints. Good posture means that your spine is in its natural S-curve position (your spine is neutral), your shoulders are pulled back slightly, and your head is not tipped forward. The following are general guidelines for applying improved posture and body mechanics to your everyday activities. Standing  When standing, keep your spine neutral and keep your feet about hip-width apart. Keep a slight bend in your knees. Your ears, shoulders, and hips should line up.  When you do a task in which you stand in one place for a long time, place one foot up on a stable object that is 2-4 inches (5-10 cm) high, such as a footstool. This helps keep your spine neutral. Sitting   When sitting, keep your spine neutral and your keep feet flat on the floor. Use a footrest, if necessary, and keep your thighs parallel to the floor. Avoid rounding your shoulders, and avoid tilting your head forward.  When working at a desk or a computer, keep your desk at a height where your hands are slightly lower than your elbows. Slide your chair under your desk so you are close enough to maintain good posture.  When working at a computer, place your monitor at a height where you are looking straight ahead and you do not have to tilt your head forward or downward to look at the screen. Resting When lying down and resting, avoid positions that are most painful for you. Try to support your neck in a neutral position. You can use a contour pillow or a small rolled-up towel. Your pillow should support your neck but not push on it. This information is not intended to replace advice given to you by your health care provider. Make sure you discuss any questions you have with  your health care provider. Document Released: 04/26/2005 Document Revised: 01/01/2016 Document Reviewed: 04/02/2015 Elsevier Interactive Patient Education  2018 [employer].  IF you received an x-ray today, you will receive an invoice from [facility]. Please contact [facility] at [phone] with questions or concerns regarding your invoice.   IF you received labwork today, you will receive an invoice from [facility]. Please contact [facility] at [phone] with questions or concerns regarding your invoice.   Our billing staff will not be able to assist you with questions regarding bills from these companies.  You will be contacted with the lab results as soon as they are available. The fastest way to get your results is to activate your My Chart account. Instructions are located on the last page of this paperwork. If you have not heard from [place] regarding the results in 2 weeks, please contact this office.

## 2017-10-21 NOTE — Progress Notes (Signed)
MRN: [id number] DOB: Sep 08, 1950  Subjective:   Judith [name] is a 67 y.o. female presenting for chief complaint of Neck Pain and Ear Pain .  Reports 8 day history of b/l ear congestion. Has popping sensation and discomfort. Denies hearing loss, ringing, sinus pain and sore throat, nausea, vomiting, abdominal pain, diarrhea and dizziness. Has not taken anything for  relief.Marland Kitchen Has history of seasonal allergies, takes zyrtec daily. Nohistory of asthma. Has some associated neck pain since ears started bothering her. Has been trying to sleep on different amount of pillows recently. Denies acute trauma, fever, chills, numbness, tingling, redness, and warmth. Has tried heat, ice, and topical gel with some relief.   Zakirah has a current medication list which includes the following prescription(s): albuterol, albuterol, b-d single use swabs regular, amlodipine, apixaban, atorvastatin, benzonatate, true metrix level 1, true metrix air glucose meter, bupropion, fenofibrate, fluoxetine, gabapentin, glucose blood, insulin degludec, insulin lispro, insulin pen needle, insulin syringe .5cc/31gx5/16", magnesium oxide, metformin, methimazole, metoprolol succinate, nitroglycerin, pantoprazole, silver sulfadiazine, sotalol, ssd, tramadol, triamcinolone cream, and trueplus lancets 28g. Also is allergic to contrast media [iodinated diagnostic agents]; dilaudid [hydromorphone hcl]; novolog [insulin aspart]; codeine; iodine; penicillins; propofol; ace inhibitors; demerol [meperidine]; neosporin [neomycin-bacitracin zn-polymyx]; percocet [oxycodone-acetaminophen]; and tape.  Armandina  has a past medical history of Abnormal EKG (07/31/2013), Arthritis, Asthma, Carpal tunnel syndrome, bilateral, Chronic kidney disease, Complication of anesthesia, Coronary artery disease, Diabetic peripheral neuropathy ([place]) ("since 1996"), GERD (gastroesophageal reflux disease), Goiter, Headache, History of shingles (06/01/2013), Hyperlipidemia LDL  goal <70 (10/13/2015), Hypertension, Hyperthyroidism, PAF (paroxysmal atrial fibrillation) ([place]) (04/29/2015), Pneumonia (~ 1976), Tremors of nervous system, and Type II diabetes mellitus ([place]). 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   Objective:   Vitals: BP (!) 150/74 (BP Location: Right Arm, Patient Position: Sitting, Cuff Size: Normal)   Pulse 80   Temp 98.8 F (37.1 C) (Oral)   Resp 16   Ht 5\' 4"  (1.626 m)   Wt 181 lb 6.4 oz (82.3 kg)   SpO2 95%   BMI 31.14 kg/m   Physical Exam  Constitutional: She is oriented to person, place, and time. She appears well-developed and well-nourished. No distress.  HENT:  Head: Normocephalic and atraumatic.  Right Ear: External ear and ear canal normal. A middle ear effusion is present.  Left Ear: External ear and ear canal normal. A middle ear effusion is present.  Nose: Mucosal edema (mild b/l) present. Right sinus exhibits no maxillary sinus tenderness and no frontal sinus tenderness. Left sinus exhibits no maxillary sinus tenderness and no frontal sinus tenderness.  Mouth/Throat: Uvula is midline and mucous membranes are normal. No posterior oropharyngeal edema, posterior oropharyngeal erythema or tonsillar abscesses. No tonsillar exudate.  Eyes: Conjunctivae are normal.  Neck: Neck supple. Muscular tenderness (musculature is tight to palpation, most notable in left trapezius musculature) present. No spinous process tenderness present. No neck rigidity. Decreased  range of motion (with lateral rotation) present. No edema and no erythema present. No Brudzinski's sign and no Kernig's sign noted.  Lymphadenopathy:       Head (right side): No submental, no submandibular, no tonsillar, no preauricular, no posterior  auricular and no occipital adenopathy present.       Head (left side): No submental, no submandibular, no tonsillar, no preauricular, no posterior auricular and no occipital adenopathy present.    She has no cervical adenopathy.       Right: No supraclavicular adenopathy present.       Left: No supraclavicular adenopathy present.  Neurological: She is alert and oriented to person, place, and time.  Sensation of BUE intact.  Strength of BUE 5/5.   Skin: Skin is warm and dry.  Psychiatric: She has a normal mood and affect.  Vitals reviewed.   No results found for this or any previous visit (from the past 24 hour(s)).  Assessment and Plan :  1. Eustachian tube disorder, bilateral Hx and PE findings consistent with ETD. Likely due to seasonal allergies.  Recommended continue daily antihistamine, adding on nasal steroid, and nasal saline rinses at night.  Advised to return to clinic if symptoms worsen, do not improve, or as needed.   2. Neck pain Likely due to tight musculature.  Normal neuro exam.  Recommended heating pad 4-5 times a day for 20 minutes at a time, daily stretching, and massage.  Given education material for cervical stretches to perform.  Advised to return to clinic if symptoms worsen, do not improve, or as needed.  Tenna Delaine, PA-C  [facility] 10/21/2017 12:18 PM

## 2017-10-27 ENCOUNTER — Ambulatory Visit (INDEPENDENT_AMBULATORY_CARE_PROVIDER_SITE_OTHER): Payer: Medicare PPO | Admitting: Neurology

## 2017-10-27 DIAGNOSIS — G4736 Sleep related hypoventilation in conditions classified elsewhere: Secondary | ICD-10-CM

## 2017-10-27 DIAGNOSIS — J45901 Unspecified asthma with (acute) exacerbation: Secondary | ICD-10-CM

## 2017-10-27 DIAGNOSIS — G4739 Other sleep apnea: Secondary | ICD-10-CM

## 2017-10-27 DIAGNOSIS — J209 Acute bronchitis, unspecified: Secondary | ICD-10-CM

## 2017-10-27 DIAGNOSIS — G4733 Obstructive sleep apnea (adult) (pediatric): Secondary | ICD-10-CM | POA: Diagnosis not present

## 2017-10-27 DIAGNOSIS — J4551 Severe persistent asthma with (acute) exacerbation: Secondary | ICD-10-CM

## 2017-10-27 DIAGNOSIS — G4731 Primary central sleep apnea: Secondary | ICD-10-CM

## 2017-10-27 DIAGNOSIS — R0683 Snoring: Secondary | ICD-10-CM

## 2017-10-27 DIAGNOSIS — J440 Chronic obstructive pulmonary disease with acute lower respiratory infection: Secondary | ICD-10-CM

## 2017-10-27 DIAGNOSIS — J449 Chronic obstructive pulmonary disease, unspecified: Secondary | ICD-10-CM

## 2017-10-28 ENCOUNTER — Emergency Department (HOSPITAL_COMMUNITY)
Admission: EM | Admit: 2017-10-28 | Discharge: 2017-10-28 | Disposition: A | Discharge status: Home / Self Care | Payer: Medicare PPO | Attending: Emergency Medicine | Admitting: Emergency Medicine

## 2017-10-28 ENCOUNTER — Emergency Department (HOSPITAL_COMMUNITY): Payer: Medicare PPO

## 2017-10-28 DIAGNOSIS — Z794 Long term (current) use of insulin: Secondary | ICD-10-CM | POA: Diagnosis not present

## 2017-10-28 DIAGNOSIS — E876 Hypokalemia: Secondary | ICD-10-CM | POA: Diagnosis not present

## 2017-10-28 DIAGNOSIS — N189 Chronic kidney disease, unspecified: Secondary | ICD-10-CM | POA: Diagnosis not present

## 2017-10-28 DIAGNOSIS — I4891 Unspecified atrial fibrillation: Secondary | ICD-10-CM | POA: Diagnosis not present

## 2017-10-28 DIAGNOSIS — I480 Paroxysmal atrial fibrillation: Secondary | ICD-10-CM | POA: Diagnosis not present

## 2017-10-28 DIAGNOSIS — R079 Chest pain, unspecified: Secondary | ICD-10-CM | POA: Diagnosis not present

## 2017-10-28 DIAGNOSIS — E1165 Type 2 diabetes mellitus with hyperglycemia: Secondary | ICD-10-CM | POA: Diagnosis not present

## 2017-10-28 DIAGNOSIS — Z79899 Other long term (current) drug therapy: Secondary | ICD-10-CM | POA: Diagnosis not present

## 2017-10-28 DIAGNOSIS — I129 Hypertensive chronic kidney disease with stage 1 through stage 4 chronic kidney disease, or unspecified chronic kidney disease: Secondary | ICD-10-CM | POA: Diagnosis not present

## 2017-10-28 DIAGNOSIS — R0902 Hypoxemia: Secondary | ICD-10-CM | POA: Diagnosis not present

## 2017-10-28 DIAGNOSIS — R0789 Other chest pain: Secondary | ICD-10-CM | POA: Diagnosis not present

## 2017-10-28 DIAGNOSIS — Z87891 Personal history of nicotine dependence: Secondary | ICD-10-CM | POA: Diagnosis not present

## 2017-10-28 DIAGNOSIS — I2510 Atherosclerotic heart disease of native coronary artery without angina pectoris: Secondary | ICD-10-CM | POA: Diagnosis not present

## 2017-10-28 DIAGNOSIS — E119 Type 2 diabetes mellitus without complications: Secondary | ICD-10-CM | POA: Insufficient documentation

## 2017-10-28 DIAGNOSIS — J45909 Unspecified asthma, uncomplicated: Secondary | ICD-10-CM | POA: Diagnosis not present

## 2017-10-28 DIAGNOSIS — I499 Cardiac arrhythmia, unspecified: Secondary | ICD-10-CM | POA: Diagnosis not present

## 2017-10-28 DIAGNOSIS — M542 Cervicalgia: Secondary | ICD-10-CM | POA: Diagnosis not present

## 2017-10-28 LAB — BASIC METABOLIC PANEL
Anion gap: 10 (ref 5–15)
BUN: 19 mg/dL (ref 6–20)
CALCIUM: 8.7 mg/dL — AB (ref 8.9–10.3)
CO2: 29 mmol/L (ref 22–32)
CREATININE: 0.83 mg/dL (ref 0.44–1.00)
Chloride: 100 mmol/L — ABNORMAL LOW (ref 101–111)
GFR calc Af Amer: >60 mL/min (ref >60)
GLUCOSE: 164 mg/dL — AB (ref 65–99)
Potassium: 3.4 mmol/L — ABNORMAL LOW (ref 3.5–5.1)
Sodium: 139 mmol/L (ref 135–145)

## 2017-10-28 LAB — CBC
HCT: 34.7 % — ABNORMAL LOW (ref 36.0–46.0)
Hemoglobin: 10.7 g/dL — ABNORMAL LOW (ref 12.0–15.0)
MCH: 27 pg (ref 26.0–34.0)
MCHC: 30.8 g/dL (ref 30.0–36.0)
MCV: 87.6 fL (ref 78.0–100.0)
Platelets: 425 10*3/uL — ABNORMAL HIGH (ref 150–400)
RBC: 3.96 MIL/uL (ref 3.87–5.11)
RDW: 13.9 % (ref 11.5–15.5)
WBC: 7.3 10*3/uL (ref 4.0–10.5)

## 2017-10-28 LAB — I-STAT TROPONIN, ED: Troponin i, poc: 0.01 ng/mL (ref 0.00–0.08)

## 2017-10-28 LAB — MAGNESIUM: MAGNESIUM: 1.1 mg/dL — AB (ref 1.7–2.4)

## 2017-10-28 MED ORDER — POTASSIUM CHLORIDE CRYS ER 20 MEQ PO TBCR
20.0000 meq | EXTENDED_RELEASE_TABLET | Freq: Once | ORAL | Status: AC
  Administered 2017-10-28: 20 meq via ORAL
  Filled 2017-10-28: qty 1

## 2017-10-28 MED ORDER — ALBUTEROL SULFATE (2.5 MG/3ML) 0.083% IN NEBU
INHALATION_SOLUTION | RESPIRATORY_TRACT | Status: AC
  Filled 2017-10-28: qty 3

## 2017-10-28 MED ORDER — ALBUTEROL SULFATE (2.5 MG/3ML) 0.083% IN NEBU
5.0000 mg | INHALATION_SOLUTION | Freq: Once | RESPIRATORY_TRACT | Status: AC
  Administered 2017-10-28: 5 mg via RESPIRATORY_TRACT
  Filled 2017-10-28: qty 6

## 2017-10-28 MED ORDER — POTASSIUM CHLORIDE CRYS ER 20 MEQ PO TBCR: 20.0000 meq | EXTENDED_RELEASE_TABLET | Freq: Two times a day (BID) | ORAL | 0 refills | Status: DC

## 2017-10-28 NOTE — ED Notes (Signed)
1st troponin drawn at 1229, not completed.  Green I-stat tube drawn at this time and taken to mini-lab.  It is being run at this time.

## 2017-10-28 NOTE — Discharge Instructions (Signed)
Take potassium as prescribed Call your doctor for recheck early next week. Return if you are worse at any time.

## 2017-10-28 NOTE — ED Triage Notes (Signed)
From home via ems; c/o dizziness, c/o CP 5/10 under clavicle when EMS arrived ; hx afib; pt not c/o CP currently; pt in afib upon ems arrival, converted approx 45 minutes after taking home medication  Pt took at home prior to EMS arrival: Nitro 0.4 Metoprolol Sotalol  EMS gave 574ml NS PTA, 324 ASA  CBG 262 163/84 HR 64 (150 prior to conversion) RR 16

## 2017-10-28 NOTE — Procedures (Signed)
PATIENT'S NAME:  Judith [name], Judith [name] DOB:      August 28, 1950      MR#:    [id number]     DATE OF RECORDING: 10/27/2017 REFERRING M.D.:  Marcial Pacas, MD and Reginia Forts, M.D. Study Performed:  Split-Night Titration Study HISTORY:    I have the pleasure to see Judith [name] in a sleep consultation. She is a 67 y.o. female patient, who recently had multiple hospitalizations and was told by the hospital physicians and nursing staff that they witnessed repeated apnea during her sleep and oxygen desaturations. She is a very brittle diabetic with complaints of coughing, wheezing and severe sleepiness in daytime.  She stated her endocrinologist discovered that her cough and bronchitis were not related to an infection but to the use of a generic insulin.  Her diabetes has led to multiple consequences in September 2017 she had an amputation of the right middle toe, and an amputation of the right foot followed in October 2017 (Dr. Sharol Given).  She reports excessive daytime sleepiness. She was told she snores, and has wheezing (witnessed by the RN on telemetry floor). She suffers from diarrhea, and she still has coughing. She wonders about sleep apnea.  Comorbidity:  sleep-related hypoventilation, Abnormal EKG, Asthma, Chronic kidney disease, CAD, Diabetic peripheral neuropathy, GERD, Headache, Hypertension, Type II diabetes, Tremor.  The patient endorsed the Epworth Sleepiness Scale at 18/24 points, the FSS at    The patient's weight 178 pounds with a height of 64 (inches), resulting in a BMI of 30.5 kg/m2. The patient's neck circumference measured 15.8 inches.  CURRENT MEDICATIONS: Proventil, Alcohol Swabs, Norvasc, Eliquis, Lipitor, True Metrix Level 1, True Metrix Air Glucose Meter, Tricor, Wellbutrin, Prozac, Neurontin, Tresiba, Humalog, Insulin Pen Needle, Mag-Ox, Glucophage, Tapazole, Toprol, Nitrostat, Protonix,  PROCEDURE:  This is a multichannel digital polysomnogram utilizing the Somnostar 11.2 system.  Electrodes and  sensors were applied and monitored per AASM Specifications.   EEG, EOG, Chin and Limb EMG, were sampled at 200 Hz.  ECG, Snore and Nasal Pressure, Thermal Airflow, Respiratory Effort, CPAP Flow and Pressure, Oximetry was sampled at 50 Hz. Digital video and audio were recorded.      BASELINE STUDY WITHOUT CPAP RESULTS: Lights Out was at 21:11 and Lights On at 05:09.  Total recording time (TRT) was 218, with a total sleep time (TST) of 124 minutes. The patient's sleep latency was 22 minutes. REM latency was 0 minutes. The sleep efficiency was rather poor at 56.9 %.    SLEEP ARCHITECTURE: WASO (Wake after sleep onset) was 63.5 minutes, Stage N1 was 22.5 minutes, Stage N2 was 83.5 minutes, Stage N3 was 18 minutes and Stage R (REM sleep) was 0 minutes.  The percentages were Stage N1 18.1%, Stage N2 67.3%, Stage N3 14.5% and Stage R (REM sleep) 0%.  RESPIRATORY ANALYSIS:  There were a total of 58 respiratory events:  2 obstructive apneas, 0 central apneas and 56 hypopneas. Snoring was noted.     The total APNEA/HYPOPNEA INDEX (AHI) was 28.1 /hour and the total RESPIRATORY DISTURBANCE INDEX was 28.1 /hour.  0 events occurred in REM sleep and 112 events in NREM. The REM AHI was 0, /hour versus a non-REM AHI of 28.1 /hour. The patient spent 244 minutes sleep time in the supine position 124 minutes in non-supine. The supine AHI was 0.0 /hour versus a non-supine AHI of 28.1 /hour.  OXYGEN SATURATION & C02:  The wake baseline 02 saturation was 94%, with the lowest being 79%. Time spent below 89% saturation  equaled 87 minutes. The average [place] CO2 during sleep prior to CPAP was at 40.4 torr. Co2 peak during NREM sleep was 53 torr for over 3 minutes.     PERIODIC LIMB MOVEMENTS: The patient had a total of 259 Periodic Limb Movements.  The Periodic Limb Movement (PLM) index was 125.3 /hour and the PLM Arousal index was 28.1 /hour.   The arousals were noted as: 19 were spontaneous, 58 were associated with PLMs,  and 36 were associated with respiratory events. There was snoring and 2 times nocturia, and PVCs in ECG.   TITRATION STUDY WITH CPAP RESULTS:   BIPAP was initiated at 9/5 cmH20 with heated humidity per AASM split night standards and pressure was advanced to BiPAP at  23/19 cm water with ST- RR 10 because of hypopneas, central apneas and desaturations.   At a BiPAP pressure of 23/19 RR 10 cmH20, there was a reduction of the AHI to 0.0 /hour but only 7.5 minutes of observed sleep time.  The patient achieved 45 minutes of sleep with an AHI of 2.7/h at BiPAP pressures of 19/15 cm without ST. SpO2 Nadir was 85%. Total recording time (TRT) was 260.5 minutes, with a total sleep time (TST) of 244 minutes. The patient's sleep latency was 14.5 minutes. REM latency was 103 minutes.  The sleep efficiency was 93.7 %.    SLEEP ARCHITECTURE: Wake after sleep was 3.5 minutes, Stage N1 14 minutes, Stage N2 90 minutes, Stage N3 73 minutes and Stage R (REM sleep) 67 minutes. The percentages were: Stage N1 5.7%, Stage N2 36.9%, Stage N3 29.9% and Stage R (REM sleep) 27.5%.   RESPIRATORY ANALYSIS:  There were a total of 65 respiratory events: 0 obstructive apneas, 20 central apneas and 45 hypopneas with 0 respiratory event related arousals (RERAs). The total APNEA/HYPOPNEA INDEX (AHI) was 16 /hour and the total RESPIRATORY DISTURBANCE INDEX was 16.0 /hour.  6 events occurred in REM sleep and 59 events in NREM. The REM AHI was 5.4 /hour versus a non-REM AHI of 20 /hour. The patient spent 100% of total sleep time in the supine position. The supine AHI was 16.0 /hour, versus a non-supine AHI of 0.0/hour.  OXYGEN SATURATION & C02:  The wake baseline 02 saturation was 92%, with the lowest being 82%. Time spent below 89% saturation equaled 162 minutes.  PERIODIC LIMB MOVEMENTS: The patient had a total of 10 Periodic Limb Movements. The Periodic Limb Movement (PLM) index was 2.5 /hour and the PLM Arousal index was 0.2  /hour.  The arousals were noted as: 27 were spontaneous, 1 only associated with PLMs, and 27 were associated with respiratory events. Central apneas emerged under BIPAP.  Post-study, the patient indicated that sleep was great, the room was a little too cool, but the technician very helpful.  POLYSOMNOGRAPHY IMPRESSION :   1. Severe and Complex Sleep Apnea (mostly OSA), Severe Hypoxemia and Hypercapnia, Insomnia and PLM arousals. The patient's sleep during the diagnostic part was very fragmented.  2. BiPAP was applied after the patient failed to tolerate CPAP at 5 and 6 cm water pressure- she could not breath.   3. PLMs improved under BiPAP therapy, as did sleep fragmentation/ insomnia.  4. Hypoxemia did not improve much, and needs to be followed by [facility] on BiPAP.    RECOMMENDATIONS: BiPAP auto machine will be provided with a 4 cm pressure spread, allowing between 15/11 and 19/15 cm water pressure with the use of a FFM. The patient slept for 100% of  the BiPAP titration. The patient was fitted with a [employer] SIMPLUS mask (FFM) in small size. Hypoxemia did not improve much, and needs to be followed by [facility] on BiPAP.  A follow up appointment will be scheduled in the Sleep Clinic at [facility].      I certify that I have reviewed the entire raw data recording prior to the issuance of this report in accordance with the Standards of Accreditation of the American Academy of Sleep Medicine (AASM)   Larey Seat, M.D.  10-28-2017  Diplomat, American Board of Psychiatry and Neurology  Diplomat, [place] [employer] [facility] Medical Director, [place] [facility]

## 2017-10-28 NOTE — ED Notes (Signed)
Pt ambulated to RR .  

## 2017-10-28 NOTE — Addendum Note (Signed)
Addended by: Larey Seat on: 10/28/2017 04:24 PM   Modules accepted: Orders

## 2017-10-28 NOTE — ED Provider Notes (Signed)
[facility] EMERGENCY DEPARTMENT Provider Note   CSN: [id number] Arrival date & time: 10/28/17  1014     History   Chief Complaint Chief Complaint  Patient presents with  . Atrial Fibrillation    HPI Judith [name] is a 67 y.o. female.  HPI  67 yo female with ho paroxysmal a fib folloed by Dr. Radford Pax.  Today had neck pain,which has been her symptom with a fib in past.  She noted these symptoms today at 0730.  She then too her additional  Metoprolol, and nitor, baby aspirin, and sotalol as instructed at last visit by Dr. Radford Pax.  Symptoms stopped en route here while in ambulance at 10 am.  No pain, mild dyspnea, no cough.  Patient is former smoker.    Past Medical History:  Diagnosis Date  . Abnormal EKG 07/31/2013  . Arthritis    "hands" (03/06/2015)  . Asthma   . Carpal tunnel syndrome, bilateral   . Chronic kidney disease   . Complication of anesthesia    slow to wake up  . Coronary artery disease     2 v CAD with CTO of the RCA and high grade bifurcational LCx/OM stenosis. S/P PCI DES x 2 to the LCx/OM.  Marland Kitchen Diabetic peripheral neuropathy ([place]) "since 1996"  . GERD (gastroesophageal reflux disease)   . Goiter   . Headache    migraines prior to menopause  . History of shingles 06/01/2013  . Hyperlipidemia LDL goal <70 10/13/2015  . Hypertension   . Hyperthyroidism   . PAF (paroxysmal atrial fibrillation) ([place]) 04/29/2015   CHADS2VASC score of 4 now on Apixaban  . Pneumonia ~ 1976  . Tremors of nervous system   . Type II diabetes mellitus (H[facility])    insulin dependent    Patient Active Problem List   Diagnosis Date Noted  . LGI bleed   . Acute blood loss anemia   . Wide-complex tachycardia ([place]) 07/04/2017  . Type II diabetes mellitus, uncontrolled ([place]) 07/04/2017  . Peripheral neuropathy 07/04/2017  . H/O hyperthyroidism 07/04/2017  . S/P transmetatarsal amputation of foot, right ([place]) 04/19/2016  . Obstructive sleep apnea 11/26/2015  .  Bilateral carpal tunnel syndrome 11/26/2015  . Hypomagnesemia 11/16/2015  . Hyperlipidemia LDL goal <70 10/13/2015  . Abnormality of gait 09/02/2015  . Memory loss 08/12/2015  . Diabetic peripheral neuropathy ([place]) 08/12/2015  . Vitamin D deficiency 08/12/2015  . Hyperthyroidism 04/30/2015  . Heme positive stool   . Persistent atrial fibrillation ([place]) 04/29/2015  . Coronary artery disease with stable angina pectoris ([place]) 03/29/2015  . Abnormal nuclear stress test   . History of goiter 09/28/2014  . GERD (gastroesophageal reflux disease) 07/31/2013  . Depression with anxiety 06/01/2013  . DM2 (diabetes mellitus, type 2) ([place]) 01/30/2013  . HTN (hypertension) 01/30/2013  . Tobacco user 01/30/2013    Past Surgical History:  Procedure Laterality Date  . ABDOMINAL HYSTERECTOMY  1988   age 39; CERVICAL DYSPLASIA; ovaries intact.   . AMPUTATION Right 01/23/2016   Procedure: Right 3rd [place] Amputation;  Surgeon: [name], MD;  Location: [facility];  Service: Orthopedics;  Laterality: Right;  . AMPUTATION Right 02/13/2016   Procedure: Right Transmetatarsal Amputation;  Surgeon: [name], MD;  Location: [facility];  Service: Orthopedics;  Laterality: Right;  . CARDIAC CATHETERIZATION N/A 02/27/2015   Procedure: Left Heart Cath and Coronary Angiography;  Surgeon: [name], MD; LAD 40%, mCFX 80%, OM 70%, RCA 100% calcified       .  CARDIAC CATHETERIZATION N/A 03/06/2015   Procedure: Coronary Stent Intervention;  Surgeon: [name], MD;  Location: [facility];  Service: Cardiovascular;  Laterality: N/A;  Mid CX 3.50x12 promus DES w/ 0% resdual and Prox OM1 2.50x20 promus DES w/ 20% residual  . CARDIOVERSION    . CARPAL TUNNEL RELEASE Right Nov 2015  . CARPAL TUNNEL RELEASE Right 1992; 05/2014   [name]; [place]  . CESAREAN SECTION  1982; 1984  . FOOT NEUROMA SURGERY Bilateral 2000  . KNEE ARTHROSCOPY Right ~ 2003   "meniscus repair"  . SHOULDER OPEN ROTATOR CUFF REPAIR  Right 1996; 1998   "w/fracture repair"  . THYROID SURGERY  2000   "removed lots of nodules"  . TONSILLECTOMY  1976     OB History   None      Home Medications    Prior to Admission medications   Medication Sig Start Date End Date Taking? Authorizing Provider  albuterol (PROVENTIL HFA) 108 (90 Base) MCG/ACT inhaler Inhale 1-2 puffs into the lungs every 6 (six) hours as needed for wheezing or shortness of breath. 11/07/15   [name], MD  albuterol (PROVENTIL) (2.5 MG/3ML) 0.083% nebulizer solution Take 3 mLs (2.5 mg total) by nebulization every 6 (six) hours as needed for wheezing or shortness of breath. 08/31/17   [name], PA-C  Alcohol Swabs (B-D SINGLE USE SWABS REGULAR) PADS 1 each by Does not apply route 2 (two) times daily. 08/29/17   Renato Shin, MD  amLODipine (NORVASC) 10 MG tablet Take 1 tablet (10 mg total) by mouth daily. 08/22/17   Sueanne Margarita, MD  apixaban (ELIQUIS) 5 MG TABS tablet Take 1 tablet (5 mg total) by mouth 2 (two) times daily. 08/17/17   Sueanne Margarita, MD  atorvastatin (LIPITOR) 20 MG tablet Take 1 tablet (20 mg total) by mouth daily. 08/22/17   Sueanne Margarita, MD  benzonatate (TESSALON) 100 MG capsule Take 100-200 mg by mouth 3 (three) times daily as needed for cough.    [provider]  Blood Glucose Calibration (TRUE METRIX LEVEL 1) Low SOLN 1 each by In Vitro route as needed. 08/25/17   Renato Shin, MD  Blood Glucose Monitoring Suppl (TRUE METRIX AIR GLUCOSE METER) w/Device KIT 1 each by Does not apply route daily. 08/25/17   Renato Shin, MD  buPROPion (WELLBUTRIN) 75 MG tablet Take 1 tablet (75 mg total) by mouth every 6 (six) hours. 10/07/17   [name], PA-C  fenofibrate (TRICOR) 145 MG tablet Take 1 tablet (145 mg total) by mouth daily. 08/22/17 08/17/18  Sueanne Margarita, MD  FLUoxetine (PROZAC) 20 MG capsule Take 1 capsule (20 mg total) by mouth at bedtime. 08/31/17   [name], PA-C  gabapentin (NEURONTIN) 300 MG  capsule Take 3 capsules (900 mg total) by mouth 2 (two) times daily. TAKE 3 CAPSULES BY MOUTH EVERY  MORNING AND 3 CAPSULES AT BEDTIME 08/31/17   Philis Fendt L, PA-C  glucose blood (TRUE METRIX BLOOD GLUCOSE TEST) test strip Used to check blood sugars 2x daily. 08/25/17   Renato Shin, MD  insulin degludec (TRESIBA FLEXTOUCH) 100 UNIT/ML SOPN FlexTouch Pen Inject 0.2 mLs (20 Units total) into the skin daily at 10 pm. 07/05/17   Cristal Ford, DO  insulin lispro (HUMALOG KWIKPEN) 100 UNIT/ML KiwkPen 3 times a day (just before each meal) 15-20-10 units Patient taking differently: Inject 10-20 Units into the skin See admin instructions. 15 units in the morning, 20 units  at lunch, and 10 units at bedtime 09/05/17   Renato Shin, MD  Insulin Pen Needle 32G X 4 MM MISC Use to inject insulin daily 08/13/16   Renato Shin, MD  Insulin Syringe-Needle U-100 (INSULIN SYRINGE .5CC/31GX5/16") 31G X 5/16" 0.5 ML MISC Use to inject insulin 08/13/16   Renato Shin, MD  magnesium oxide (MAG-OX) 400 (241.3 Mg) MG tablet Take 1 tablet (400 mg total) by mouth daily. 10/07/17   [name], PA-C  metFORMIN (GLUCOPHAGE-XR) 500 MG 24 hr tablet Take 4 tablets (2,000 mg total) by mouth daily with breakfast. Patient taking differently: Take 1,000 mg by mouth 2 (two) times daily.  08/25/17   Renato Shin, MD  methimazole (TAPAZOLE) 10 MG tablet Take 1 tablet (10 mg total) by mouth daily. 08/25/17   Renato Shin, MD  metoprolol succinate (TOPROL-XL) 25 MG 24 hr tablet Take 1 tablet (25 mg total) by mouth 2 (two) times daily. 08/22/17   Sueanne Margarita, MD  nitroGLYCERIN (NITROSTAT) 0.4 MG SL tablet Place 1 tablet (0.4 mg total) under the tongue every 5 (five) minutes as needed for chest pain. 02/10/17   [name], PA-C  pantoprazole (PROTONIX) 40 MG tablet Take 1 tablet (40 mg total) by mouth daily. 08/31/17   [name], PA-C  silver sulfADIAZINE (SILVADENE) 1 % cream Apply 1 application topically daily. 07/01/17    Trula Slade, DPM  sotalol (BETAPACE) 120 MG tablet Take 1 tablet (120 mg total) by mouth every 12 (twelve) hours. 08/22/17   Sueanne Margarita, MD  SSD 1 % cream APPLY TO AFFECTED AREA ONCE DAILY 03/11/17   [name], MD  traMADol (ULTRAM) 50 MG tablet Take 1 tablet (50 mg total) by mouth every 8 (eight) hours as needed. 08/31/17   [name], PA-C  triamcinolone cream (KENALOG) 0.1 % Apply 1 application topically 2 (two) times daily. 10/07/17   [name], PA-C  TRUEPLUS LANCETS 28G MISC 1 each by Does not apply route 2 (two) times daily. 08/29/17   Renato Shin, MD    Family History Family History  Problem Relation Age of Onset  . Cancer Mother 33       bronchial cancer  . Breast cancer Mother   . Lung cancer Mother   . Hypertension Father   . COPD Father   . Heart disease Father 70       CAD with cardiac stenting  . Heart attack Father   . Parkinson's disease Father   . Allergies Sister   . Breast cancer Maternal Grandmother   . Emphysema Maternal Grandfather   . Leukemia Paternal Grandmother   . Emphysema Paternal Grandfather   . Thyroid disease Neg Hx     Social History Social History   Tobacco Use  . Smoking status: Former Smoker    Packs/day: 0.00    Years: 41.00    Pack years: 0.00    Types: Cigarettes    Last attempt to quit: 03/05/2015    Years since quitting: 2.6  . Smokeless tobacco: Never Used  . Tobacco comment: 04/29/2015 "quit smoking cigarettes 02/27/2015"  Substance Use Topics  . Alcohol use: No  . Drug use: No     Allergies   Contrast media [iodinated diagnostic agents]; Dilaudid [hydromorphone hcl]; Novolog [insulin aspart]; Codeine; Iodine; Penicillins; Propofol; Ace inhibitors; Demerol [meperidine]; Neosporin [neomycin-bacitracin zn-polymyx]; Percocet [oxycodone-acetaminophen]; and Tape   Review of Systems Review of Systems  Constitutional: Negative.   HENT: Positive for congestion.  States she took insulin that she  was allergic to one week and had chest congestion  Eyes: Negative.   Respiratory: Positive for chest tightness.   Cardiovascular: Negative.   Gastrointestinal: Negative.   Endocrine: Negative.   Genitourinary: Negative.   Musculoskeletal: Negative.   Skin: Negative.   Hematological: Negative.   Psychiatric/Behavioral: Negative.      Physical Exam Updated Vital Signs BP (!) 166/76 (BP Location: Left Arm)   Pulse 60   Resp 16   Ht 1.626 m (_0 )   Wt 80.3 kg (177 lb)   SpO2 97%   BMI 30.38 kg/m   Physical Exam  Constitutional: She is oriented to person, place, and time. She appears well-developed and well-nourished.  HENT:  Head: Normocephalic and atraumatic.  Eyes: Pupils are equal, round, and reactive to light. EOM are normal.  Neck: Normal range of motion. Neck supple.  Pulmonary/Chest: Effort normal.  Abdominal: Soft.  Musculoskeletal: Normal range of motion.  Neurological: She is alert and oriented to person, place, and time.  Skin: Skin is warm. Capillary refill takes less than 2 seconds.  Psychiatric: She has a normal mood and affect.  Nursing note and vitals reviewed.    ED Treatments / Results  Labs (all labs ordered are listed, but only abnormal results are displayed) Labs Reviewed  BASIC METABOLIC PANEL  CBC    EKG None  Radiology No results found.  Procedures Procedures (including critical care time)  Medications Ordered in ED Medications - No data to display   Initial Impression / Assessment and Plan / ED Course  I have reviewed the triage vital signs and the nursing notes.  Pertinent labs & imaging results that were available during my care of the patient were reviewed by me and considered in my medical decision making (see chart for details).    Patient with paroxysmal a fib.  Patient in a fib today but converted prior to ED evaluation.  Labs pending.  Will d/c if no other acute abnormality.  Troponin normal Hypokalemia   Final  Clinical Impressions(s) / ED Diagnoses   Final diagnoses:  Paroxysmal atrial fibrillation [facility])  Hypokalemia    ED Discharge Orders    None       Pattricia Boss, MD 10/28/17 1558

## 2017-10-28 NOTE — ED Notes (Signed)
Patient transported to X-ray 

## 2017-10-31 ENCOUNTER — Telehealth (HOSPITAL_COMMUNITY): Payer: Self-pay | Admitting: *Deleted

## 2017-10-31 NOTE — Telephone Encounter (Signed)
LMOM for pt to [facility]bk to sched f/u appt from ED.  Pt seen twice within 1 month.  On sotalol.

## 2017-11-01 ENCOUNTER — Emergency Department (HOSPITAL_COMMUNITY)
Admission: EM | Admit: 2017-11-01 | Discharge: 2017-11-02 | Disposition: A | Discharge status: Home / Self Care | Payer: Medicare PPO | Attending: Emergency Medicine | Admitting: Emergency Medicine

## 2017-11-01 ENCOUNTER — Telehealth: Payer: Self-pay

## 2017-11-01 ENCOUNTER — Telehealth: Payer: Self-pay | Admitting: Endocrinology

## 2017-11-01 ENCOUNTER — Encounter (HOSPITAL_COMMUNITY): Payer: Self-pay

## 2017-11-01 ENCOUNTER — Emergency Department (HOSPITAL_COMMUNITY): Payer: Medicare PPO

## 2017-11-01 DIAGNOSIS — N189 Chronic kidney disease, unspecified: Secondary | ICD-10-CM | POA: Insufficient documentation

## 2017-11-01 DIAGNOSIS — J45909 Unspecified asthma, uncomplicated: Secondary | ICD-10-CM | POA: Insufficient documentation

## 2017-11-01 DIAGNOSIS — Z794 Long term (current) use of insulin: Secondary | ICD-10-CM | POA: Diagnosis not present

## 2017-11-01 DIAGNOSIS — I129 Hypertensive chronic kidney disease with stage 1 through stage 4 chronic kidney disease, or unspecified chronic kidney disease: Secondary | ICD-10-CM | POA: Diagnosis not present

## 2017-11-01 DIAGNOSIS — I480 Paroxysmal atrial fibrillation: Secondary | ICD-10-CM | POA: Diagnosis not present

## 2017-11-01 DIAGNOSIS — R739 Hyperglycemia, unspecified: Secondary | ICD-10-CM

## 2017-11-01 DIAGNOSIS — I2510 Atherosclerotic heart disease of native coronary artery without angina pectoris: Secondary | ICD-10-CM | POA: Diagnosis not present

## 2017-11-01 DIAGNOSIS — E039 Hypothyroidism, unspecified: Secondary | ICD-10-CM | POA: Insufficient documentation

## 2017-11-01 DIAGNOSIS — I4891 Unspecified atrial fibrillation: Secondary | ICD-10-CM | POA: Diagnosis not present

## 2017-11-01 DIAGNOSIS — E1165 Type 2 diabetes mellitus with hyperglycemia: Secondary | ICD-10-CM | POA: Diagnosis not present

## 2017-11-01 DIAGNOSIS — R05 Cough: Secondary | ICD-10-CM | POA: Diagnosis not present

## 2017-11-01 LAB — BASIC METABOLIC PANEL
Anion gap: 12 (ref 5–15)
BUN: 9 mg/dL (ref 8–23)
CO2: 25 mmol/L (ref 22–32)
Calcium: 9.3 mg/dL (ref 8.9–10.3)
Chloride: 95 mmol/L — ABNORMAL LOW (ref 98–111)
Creatinine, Ser: 1.02 mg/dL — ABNORMAL HIGH (ref 0.44–1.00)
GFR calc Af Amer: >60 mL/min (ref >60)
GFR calc non Af Amer: 56 mL/min — ABNORMAL LOW (ref >60)
Glucose, Bld: 477 mg/dL — ABNORMAL HIGH (ref 70–99)
POTASSIUM: 4.1 mmol/L (ref 3.5–5.1)
SODIUM: 132 mmol/L — AB (ref 135–145)

## 2017-11-01 LAB — CBC
HCT: 37 % (ref 36.0–46.0)
Hemoglobin: 11.7 g/dL — ABNORMAL LOW (ref 12.0–15.0)
MCH: 27.3 pg (ref 26.0–34.0)
MCHC: 31.6 g/dL (ref 30.0–36.0)
MCV: 86.2 fL (ref 78.0–100.0)
PLATELETS: 526 10*3/uL — AB (ref 150–400)
RBC: 4.29 MIL/uL (ref 3.87–5.11)
RDW: 13.9 % (ref 11.5–15.5)
WBC: 9 10*3/uL (ref 4.0–10.5)

## 2017-11-01 LAB — MAGNESIUM: Magnesium: 1.5 mg/dL — ABNORMAL LOW (ref 1.7–2.4)

## 2017-11-01 LAB — TROPONIN I: Troponin I: <0.03 ng/mL (ref <0.03)

## 2017-11-01 MED ORDER — INSULIN ASPART 100 UNIT/ML ~~LOC~~ SOLN
8.0000 [IU] | Freq: Once | SUBCUTANEOUS | Status: AC
  Administered 2017-11-02: 8 [IU] via SUBCUTANEOUS
  Filled 2017-11-01: qty 1

## 2017-11-01 MED ORDER — MAGNESIUM SULFATE IN D5W 1-5 GM/100ML-% IV SOLN
1.0000 g | Freq: Once | INTRAVENOUS | Status: AC
  Administered 2017-11-02: 1 g via INTRAVENOUS
  Filled 2017-11-01: qty 100

## 2017-11-01 NOTE — ED Provider Notes (Signed)
[facility] EMERGENCY DEPARTMENT Provider Note   CSN: [id number] Arrival date & time: 11/01/17  2212     History   Chief Complaint Chief Complaint  Patient presents with  . Atrial Fibrillation    HPI Judith [name] is a 67 y.o. female.  HPI Patient presents emergency room for evaluation of atrial fibrillation.  Patient states she has a history of recurrent atrial fibrillation.  Recently she has been more frequently going in and out of atrial fibrillation.  She was last in the emergency room just 4 days ago.  Patient is actually scheduled to go to the A. fib clinic tomorrow.  Patient states this evening she started having tachycardia and felt discomfort in her neck and felt short of breath.  She took her home medications and came into the emergency room for evaluation.  While I was at the bedside talking to the patient her heart rate spontaneously dropped into the 70s.  Patient states her symptoms have now resolved. Past Medical History:  Diagnosis Date  . Abnormal EKG 07/31/2013  . Arthritis    "hands" (03/06/2015)  . Asthma   . Carpal tunnel syndrome, bilateral   . Chronic kidney disease   . Complication of anesthesia    slow to wake up  . Coronary artery disease     2 v CAD with CTO of the RCA and high grade bifurcational LCx/OM stenosis. S/P PCI DES x 2 to the LCx/OM.  Marland Kitchen Diabetic peripheral neuropathy ([place]) "since 1996"  . GERD (gastroesophageal reflux disease)   . Goiter   . Headache    migraines prior to menopause  . History of shingles 06/01/2013  . Hyperlipidemia LDL goal <70 10/13/2015  . Hypertension   . Hyperthyroidism   . PAF (paroxysmal atrial fibrillation) ([place]) 04/29/2015   CHADS2VASC score of 4 now on Apixaban  . Pneumonia ~ 1976  . Tremors of nervous system   . Type II diabetes mellitus (HCC)    insulin dependent    Patient Active Problem List   Diagnosis Date Noted  . LGI bleed   . Acute blood loss anemia   . Wide-complex tachycardia  ([place]) 07/04/2017  . Type II diabetes mellitus, uncontrolled ([place]) 07/04/2017  . Peripheral neuropathy 07/04/2017  . H/O hyperthyroidism 07/04/2017  . S/P transmetatarsal amputation of foot, right ([place]) 04/19/2016  . Obstructive sleep apnea 11/26/2015  . Bilateral carpal tunnel syndrome 11/26/2015  . Hypomagnesemia 11/16/2015  . Hyperlipidemia LDL goal <70 10/13/2015  . Abnormality of gait 09/02/2015  . Memory loss 08/12/2015  . Diabetic peripheral neuropathy ([place]) 08/12/2015  . Vitamin D deficiency 08/12/2015  . Hyperthyroidism 04/30/2015  . Heme positive stool   . Persistent atrial fibrillation ([place]) 04/29/2015  . Coronary artery disease with stable angina pectoris ([place]) 03/29/2015  . Abnormal nuclear stress test   . History of goiter 09/28/2014  . GERD (gastroesophageal reflux disease) 07/31/2013  . Depression with anxiety 06/01/2013  . DM2 (diabetes mellitus, type 2) ([place]) 01/30/2013  . HTN (hypertension) 01/30/2013  . Tobacco user 01/30/2013    Past Surgical History:  Procedure Laterality Date  . ABDOMINAL HYSTERECTOMY  1988   age 67; CERVICAL DYSPLASIA; ovaries intact.   . AMPUTATION Right 01/23/2016   Procedure: Right 3rd Ray Amputation;  Surgeon: [name], MD;  Location: [place];  Service: Orthopedics;  Laterality: Right;  . AMPUTATION Right 02/13/2016   Procedure: Right Transmetatarsal Amputation;  Surgeon: [name], MD;  Location: [place];  Service: Orthopedics;  Laterality: Right;  . CARDIAC CATHETERIZATION N/A 02/27/2015   Procedure: Left Heart Cath and Coronary Angiography;  Surgeon: [name], MD; LAD 40%, mCFX 80%, OM 70%, RCA 100% calcified       . CARDIAC CATHETERIZATION N/A 03/06/2015   Procedure: Coronary Stent Intervention;  Surgeon: [name], MD;  Location: [place] [facility];  Service: Cardiovascular;  Laterality: N/A;  Mid CX 3.50x12 promus DES w/ 0% resdual and Prox OM1 2.50x20 promus DES w/ 20% residual  . CARDIOVERSION    . CARPAL TUNNEL  RELEASE Right Nov 2015  . CARPAL TUNNEL RELEASE Right 1992; 05/2014   [name]; [place]  . CESAREAN SECTION  1982; 1984  . FOOT NEUROMA SURGERY Bilateral 2000  . KNEE ARTHROSCOPY Right ~ 2003   "meniscus repair"  . SHOULDER OPEN ROTATOR CUFF REPAIR Right 1996; 1998   "w/fracture repair"  . THYROID SURGERY  2000   "removed lots of nodules"  . TONSILLECTOMY  1976     OB History   None      Home Medications    Prior to Admission medications   Medication Sig Start Date End Date Taking? Authorizing Provider  albuterol (PROVENTIL HFA) 108 (90 Base) MCG/ACT inhaler Inhale 1-2 puffs into the lungs every 6 (six) hours as needed for wheezing or shortness of breath. 11/07/15  Yes [name], MD  albuterol (PROVENTIL) (2.5 MG/3ML) 0.083% nebulizer solution Take 3 mLs (2.5 mg total) by nebulization every 6 (six) hours as needed for wheezing or shortness of breath. 08/31/17  Yes [name], PA-C  Alcohol Swabs (B-D SINGLE USE SWABS REGULAR) PADS 1 each by Does not apply route 2 (two) times daily. 08/29/17  Yes Renato Shin, MD  amLODipine (NORVASC) 10 MG tablet Take 1 tablet (10 mg total) by mouth daily. 08/22/17  Yes Turner, Eber Hong, MD  apixaban (ELIQUIS) 5 MG TABS tablet Take 1 tablet (5 mg total) by mouth 2 (two) times daily. 08/17/17  Yes Turner, Eber Hong, MD  atorvastatin (LIPITOR) 20 MG tablet Take 1 tablet (20 mg total) by mouth daily. 08/22/17  Yes Turner, Eber Hong, MD  benzonatate (TESSALON) 100 MG capsule Take 100-200 mg by mouth 3 (three) times daily as needed for cough.   Yes [provider]  Blood Glucose Calibration (TRUE METRIX LEVEL 1) Low SOLN 1 each by In Vitro route as needed. 08/25/17  Yes Renato Shin, MD  Blood Glucose Monitoring Suppl (TRUE METRIX AIR GLUCOSE METER) w/Device KIT 1 each by Does not apply route daily. 08/25/17  Yes Renato Shin, MD  buPROPion (WELLBUTRIN) 75 MG tablet Take 1 tablet (75 mg total) by mouth every 6 (six) hours. 10/07/17  Yes [name], PA-C  fenofibrate (TRICOR) 145 MG tablet Take 1 tablet (145 mg total) by mouth daily. 08/22/17 08/17/18 Yes Turner, Eber Hong, MD  FLUoxetine (PROZAC) 20 MG capsule Take 1 capsule (20 mg total) by mouth at bedtime. 08/31/17  Yes [name], PA-C  gabapentin (NEURONTIN) 300 MG capsule Take 3 capsules (900 mg total) by mouth 2 (two) times daily. TAKE 3 CAPSULES BY MOUTH EVERY  MORNING AND 3 CAPSULES AT BEDTIME 08/31/17  Yes Philis Fendt L, PA-C  glucose blood (TRUE METRIX BLOOD GLUCOSE TEST) test strip Used to check blood sugars 2x daily. 08/25/17  Yes Renato Shin, MD  insulin degludec (TRESIBA FLEXTOUCH) 100 UNIT/ML SOPN FlexTouch Pen Inject 0.2 mLs (20 Units total) into the skin daily at 10 pm. 07/05/17  Yes Cristal Ford,  DO  insulin lispro (HUMALOG KWIKPEN) 100 UNIT/ML KiwkPen 3 times a day (just before each meal) 15-20-10 units Patient taking differently: Inject 10-20 Units into the skin See admin instructions. 15 units in the morning, 20 units at lunch, and 10 units at bedtime 09/05/17  Yes Renato Shin, MD  Insulin Pen Needle 32G X 4 MM MISC Use to inject insulin daily 08/13/16  Yes Renato Shin, MD  Insulin Syringe-Needle U-100 (INSULIN SYRINGE .5CC/31GX5/16") 31G X 5/16" 0.5 ML MISC Use to inject insulin 08/13/16  Yes Renato Shin, MD  magnesium oxide (MAG-OX) 400 (241.3 Mg) MG tablet Take 1 tablet (400 mg total) by mouth daily. 10/07/17  Yes [name], PA-C  metFORMIN (GLUCOPHAGE-XR) 500 MG 24 hr tablet Take 4 tablets (2,000 mg total) by mouth daily with breakfast. Patient taking differently: Take 1,000 mg by mouth 2 (two) times daily.  08/25/17  Yes Renato Shin, MD  methimazole (TAPAZOLE) 10 MG tablet Take 1 tablet (10 mg total) by mouth daily. 08/25/17  Yes Renato Shin, MD  metoprolol succinate (TOPROL-XL) 25 MG 24 hr tablet Take 1 tablet (25 mg total) by mouth 2 (two) times daily. 08/22/17  Yes Turner, Eber Hong, MD  nitroGLYCERIN (NITROSTAT) 0.4 MG SL tablet Place 1  tablet (0.4 mg total) under the tongue every 5 (five) minutes as needed for chest pain. 02/10/17  Yes [name], PA-C  pantoprazole (PROTONIX) 40 MG tablet Take 1 tablet (40 mg total) by mouth daily. 08/31/17  Yes [name], PA-C  potassium chloride SA (K-DUR,KLOR-CON) 20 MEQ tablet Take 1 tablet (20 mEq total) by mouth 2 (two) times daily. 10/28/17  Yes Pattricia Boss, MD  silver sulfADIAZINE (SILVADENE) 1 % cream Apply 1 application topically daily. 07/01/17  Yes Trula Slade, DPM  sotalol (BETAPACE) 120 MG tablet Take 1 tablet (120 mg total) by mouth every 12 (twelve) hours. 08/22/17  Yes Turner, Eber Hong, MD  traMADol (ULTRAM) 50 MG tablet Take 1 tablet (50 mg total) by mouth every 8 (eight) hours as needed. Patient taking differently: Take 50 mg by mouth every 8 (eight) hours as needed for moderate pain.  08/31/17  Yes [name], PA-C  triamcinolone cream (KENALOG) 0.1 % Apply 1 application topically 2 (two) times daily. 10/07/17  Yes [name], PA-C  TRUEPLUS LANCETS 28G MISC 1 each by Does not apply route 2 (two) times daily. 08/29/17  Yes Renato Shin, MD  SSD 1 % cream APPLY TO AFFECTED AREA ONCE DAILY Patient not taking: Reported on 10/28/2017 03/11/17   [name], MD    Family History Family History  Problem Relation Age of Onset  . Cancer Mother 26       bronchial cancer  . Breast cancer Mother   . Lung cancer Mother   . Hypertension Father   . COPD Father   . Heart disease Father 2       CAD with cardiac stenting  . Heart attack Father   . Parkinson's disease Father   . Allergies Sister   . Breast cancer Maternal Grandmother   . Emphysema Maternal Grandfather   . Leukemia Paternal Grandmother   . Emphysema Paternal Grandfather   . Thyroid disease Neg Hx     Social History Social History   Tobacco Use  . Smoking status: Former Smoker    Packs/day: 0.00    Years: 41.00    Pack years: 0.00    Types: Cigarettes    Last attempt to quit:  03/05/2015    Years since quitting: 2.6  . Smokeless tobacco: Never Used  . Tobacco comment: 04/29/2015 "quit smoking cigarettes 02/27/2015"  Substance Use Topics  . Alcohol use: No  . Drug use: No     Allergies   Contrast media [iodinated diagnostic agents]; Dilaudid [hydromorphone hcl]; Novolog [insulin aspart]; Codeine; Iodine; Penicillins; Propofol; Ace inhibitors; Demerol [meperidine]; Neosporin [neomycin-bacitracin zn-polymyx]; Percocet [oxycodone-acetaminophen]; and Tape   Review of Systems Review of Systems  All other systems reviewed and are negative.    Physical Exam Updated Vital Signs BP (!) 143/100   Pulse 66   Temp 98.7 F (37.1 C) (Oral)   Resp 20   SpO2 98%   Physical Exam  Constitutional: She appears well-developed and well-nourished. No distress.  HENT:  Head: Normocephalic and atraumatic.  Right Ear: External ear normal.  Left Ear: External ear normal.  Eyes: Conjunctivae are normal. Right eye exhibits no discharge. Left eye exhibits no discharge. No scleral icterus.  Neck: Neck supple. No tracheal deviation present.  Cardiovascular: Intact distal pulses. An irregularly irregular rhythm present. Tachycardia present.  Pulmonary/Chest: Effort normal and breath sounds normal. No stridor. No respiratory distress. She has no wheezes. She has no rales.  Abdominal: Soft. Bowel sounds are normal. She exhibits no distension. There is no tenderness. There is no rebound and no guarding.  Musculoskeletal: She exhibits no edema or tenderness.  Neurological: She is alert. She has normal strength. No cranial nerve deficit (no facial droop, extraocular movements intact, no slurred speech) or sensory deficit. She exhibits normal muscle tone. She displays no seizure activity. Coordination normal.  Skin: Skin is warm and dry. No rash noted.  Psychiatric: She has a normal mood and affect.  Nursing note and vitals reviewed.    ED Treatments / Results  Labs (all labs  ordered are listed, but only abnormal results are displayed) Labs Reviewed  BASIC METABOLIC PANEL - Abnormal; Notable for the following components:      Result Value   Sodium 132 (*)    Chloride 95 (*)    Glucose, Bld 477 (*)    Creatinine, Ser 1.02 (*)    GFR calc non Af Amer 56 (*)    All other components within normal limits  CBC - Abnormal; Notable for the following components:   Hemoglobin 11.7 (*)    Platelets 526 (*)    All other components within normal limits  MAGNESIUM - Abnormal; Notable for the following components:   Magnesium 1.5 (*)    All other components within normal limits  TROPONIN I    EKG EKG Interpretation  Date/Time:  Tuesday November 01 2017 22:22:54 EDT Ventricular Rate:  149 PR Interval:    QRS Duration: 70 QT Interval:  318 QTC Calculation: 500 R Axis:   17 Text Interpretation:  atrial fibrillation with rapid rate Low voltage QRS Cannot rule out Anteroseptal infarct , age undetermined Abnormal ECG Confirmed by Dorie Rank [phone]) on 11/01/2017 10:37:26 PM   Radiology Dg Chest Portable 1 View  Result Date: 11/01/2017 CLINICAL DATA:  Atrial fibrillation, dyspnea, cough and diaphoresis. EXAM: PORTABLE CHEST 1 VIEW COMPARISON:  10/28/2017 FINDINGS: Borderline cardiomegaly with mild aortic atherosclerosis. No aneurysm. Chronic bronchitic change of the lungs without alveolar consolidation, CHF, effusion or pneumothorax. Soft tissue calcification about the right humeral head may reflect rotator cuff calcific tendinopathy along the supraspinatus. No acute nor suspicious osseous lesions. IMPRESSION: 1. Chronic bronchitic change of the lungs. 2. Aortic atherosclerosis without aneurysm. 3. Calcific rotator cuff  tendinopathy on the right. Electronically Signed   By: Ashley Royalty M.D.   On: 11/01/2017 23:00    Procedures Procedures (including critical care time)  Medications Ordered in ED Medications  magnesium sulfate IVPB 1 g 100 mL (1 g Intravenous New Bag/Given  11/02/17 0014)  insulin aspart (novoLOG) injection 8 Units (8 Units Subcutaneous Given 11/02/17 0004)     Initial Impression / Assessment and Plan / ED Course  I have reviewed the triage vital signs and the nursing notes.  Pertinent labs & imaging results that were available during my care of the patient were reviewed by me and considered in my medical decision making (see chart for details).  Clinical Course as of Nov 03 15  Wed Nov 02, 2017  0016 Patient's laboratory tests are notable for decreased magnesium.  Plan on giving the patient a dose of magnesium here in the ED.  Patient is also rather hyperglycemic but no signs of DKA.  Insulin ordered here in the emergency room   [JK]    Clinical Course User Index [JK] Dorie Rank, MD    Patient presented to the emergency room for evaluation of atrial fibrillation with a rapid rate.  Incidental hyperglycemia noted.  Patient was given a dose of insulin.  She has had issues with hypomagnesemia in the past so I will give her a dose of magnesium.  Patient is otherwise stable and plan on outpatient follow-up with her cardiologist.  We discussed the importance of watching her blood sugar closely.   Final Clinical Impressions(s) / ED Diagnoses   Final diagnoses:  Atrial fibrillation with RVR [facility])  Hyperglycemia    ED Discharge Orders    None       Dorie Rank, MD 11/02/17 [phone]

## 2017-11-01 NOTE — Telephone Encounter (Signed)
Patient stated that insurance will not cover insulin Humalog need a PA  [place], [place]

## 2017-11-01 NOTE — Telephone Encounter (Signed)
-----   Message from Larey Seat, MD sent at 10/28/2017  4:24 PM EDT ----- 1. Severe and Complex Sleep Apnea (mostly OSA), Severe Hypoxemia  and Hypercapnia, Insomnia and PLM arousals. The patient's sleep  during the diagnostic part was very fragmented.  2. BiPAP was applied after the patient failed to tolerate CPAP at  5 and 6 cm water pressure- she could not breath.  3. PLMs improved under BiPAP therapy, as did sleep fragmentation/  insomnia.  4. Hypoxemia did not improve much, and needs to be followed by  ONO on BiPAP.   RECOMMENDATIONS: BiPAP auto machine will be provided with a 4 cm  pressure spread, allowing between 15/11 and 19/15 cm water  pressure with the use of a FFM. The patient slept for 100% of the  BiPAP titration. The patient was fitted with a [employer]  SIMPLUS mask (FFM) in small size. Hypoxemia did not improve much, and needs to be followed by ONO  on BiPAP. A follow up appointment will be scheduled in the Sleep Clinic at  [facility].

## 2017-11-01 NOTE — Discharge Instructions (Signed)
Follow up with your cardiologist as planned, monitor your blood sugars, continue your current medications

## 2017-11-01 NOTE — ED Triage Notes (Signed)
Pt reports that she is in afib again, seen recently for the same, last time converted herself, CP started tonight with SOB and dizziness. HR 145

## 2017-11-01 NOTE — Telephone Encounter (Signed)
I called pt. I advised pt that Dr. Brett Fairy reviewed their sleep study results and found that pt showed severe and complex sleep apnea with severe hypoxemia during her sleep study. Dr. Rexene Alberts recommends that pt start a bipap, since she could not tolerate cpap, and did well with bipap. I explained that an ONO on Bipap will be completed with [facility] after bipap set up as well. I reviewed PAP compliance expectations with the pt. Pt is agreeable to starting a CPAP. I advised pt that an order will be sent to a DME, [facility], and [facility] will call the pt within about one week after they file with the pt's insurance. [facility] will show the pt how to use the machine, fit for masks, and troubleshoot the CPAP if needed. A follow up appt was made for insurance purposes with Hoyle Sauer, NP on 02/28/18 at 9:45am. Pt verbalized understanding to arrive 15 minutes early and bring their CPAP. A letter with all of this information in it will be sent to the pt's mychart account as a reminder. I verified with the pt that the address we have on file is correct. Pt verbalized understanding of results. Pt had no questions at this time but was encouraged to call back if questions arise.

## 2017-11-02 ENCOUNTER — Other Ambulatory Visit: Payer: Self-pay

## 2017-11-02 ENCOUNTER — Ambulatory Visit (HOSPITAL_BASED_OUTPATIENT_CLINIC_OR_DEPARTMENT_OTHER)
Admission: RE | Admit: 2017-11-02 | Discharge: 2017-11-02 | Disposition: A | Discharge status: Home / Self Care | Payer: Medicare PPO | Source: Ambulatory Visit | Attending: Nurse Practitioner | Admitting: Nurse Practitioner

## 2017-11-02 ENCOUNTER — Encounter (HOSPITAL_COMMUNITY): Payer: Self-pay | Admitting: Nurse Practitioner

## 2017-11-02 VITALS — BP 136/74 | HR 66 | Ht 64.0 in | Wt 176.0 lb

## 2017-11-02 DIAGNOSIS — J45909 Unspecified asthma, uncomplicated: Secondary | ICD-10-CM | POA: Diagnosis not present

## 2017-11-02 DIAGNOSIS — E039 Hypothyroidism, unspecified: Secondary | ICD-10-CM | POA: Diagnosis not present

## 2017-11-02 DIAGNOSIS — I2510 Atherosclerotic heart disease of native coronary artery without angina pectoris: Secondary | ICD-10-CM | POA: Diagnosis not present

## 2017-11-02 DIAGNOSIS — E1165 Type 2 diabetes mellitus with hyperglycemia: Secondary | ICD-10-CM | POA: Diagnosis not present

## 2017-11-02 DIAGNOSIS — I480 Paroxysmal atrial fibrillation: Secondary | ICD-10-CM | POA: Diagnosis not present

## 2017-11-02 DIAGNOSIS — Z794 Long term (current) use of insulin: Secondary | ICD-10-CM | POA: Diagnosis not present

## 2017-11-02 DIAGNOSIS — I129 Hypertensive chronic kidney disease with stage 1 through stage 4 chronic kidney disease, or unspecified chronic kidney disease: Secondary | ICD-10-CM | POA: Diagnosis not present

## 2017-11-02 DIAGNOSIS — N189 Chronic kidney disease, unspecified: Secondary | ICD-10-CM | POA: Diagnosis not present

## 2017-11-02 MED ORDER — INSULIN ASPART 100 UNIT/ML FLEXPEN: PEN_INJECTOR | SUBCUTANEOUS | 11 refills | Status: DC

## 2017-11-02 NOTE — Progress Notes (Signed)
Primary Care Physician: [name], MD Referring Physician:  [name] Cardiologist: Dr. [name] is a 67 y.o. female with a h/o HTN, ASCAD with CTO of the RCA and high grade LCx/OM stenosis s/p DES x 2 (10/16), hyperlipidemia, Type II DM,hyperthyroidism onmethimazole, atrial fibrillations/p multiple DCCV in the past. She was loaded on sotalol around one year ago per pt and has had recent 2 ER visits for breakthrough afib, last visit was yesterday. She spontaneously converted. She reports that she was recently found to have severe sleep apnea and is pending the start of  BiPAP. She also has difficulty maintaining mag/K+ levels and is on mag/K+ supplementation. Echo shows LA size of 46 mm.    Today, she denies symptoms of palpitations, chest pain, shortness of breath, orthopnea, PND, lower extremity edema, dizziness, presyncope, syncope, or neurologic sequela. The patient is tolerating medications without difficulties and is otherwise without complaint today.   Past Medical History:  Diagnosis Date  . Abnormal EKG 07/31/2013  . Arthritis    "hands" (03/06/2015)  . Asthma   . Carpal tunnel syndrome, bilateral   . Chronic kidney disease   . Complication of anesthesia    slow to wake up  . Coronary artery disease     2 v CAD with CTO of the RCA and high grade bifurcational LCx/OM stenosis. S/P PCI DES x 2 to the LCx/OM.  Marland Kitchen Diabetic peripheral neuropathy ([place]) "since 1996"  . GERD (gastroesophageal reflux disease)   . Goiter   . Headache    migraines prior to menopause  . History of shingles 06/01/2013  . Hyperlipidemia LDL goal <70 10/13/2015  . Hypertension   . Hyperthyroidism   . PAF (paroxysmal atrial fibrillation) ([place]) 04/29/2015   CHADS2VASC score of 4 now on Apixaban  . Pneumonia ~ 1976  . Tremors of nervous system   . Type II diabetes mellitus (HCC)    insulin dependent   Past Surgical History:  Procedure Laterality Date  . ABDOMINAL HYSTERECTOMY  1988     age 67; CERVICAL DYSPLASIA; ovaries intact.   . AMPUTATION Right 01/23/2016   Procedure: Right 3rd Ray Amputation;  Surgeon: [name], MD;  Location: [place];  Service: Orthopedics;  Laterality: Right;  . AMPUTATION Right 02/13/2016   Procedure: Right Transmetatarsal Amputation;  Surgeon: [name], MD;  Location: [place];  Service: Orthopedics;  Laterality: Right;  . CARDIAC CATHETERIZATION N/A 02/27/2015   Procedure: Left Heart Cath and Coronary Angiography;  Surgeon: [name], MD; LAD 40%, mCFX 80%, OM 70%, RCA 100% calcified       . CARDIAC CATHETERIZATION N/A 03/06/2015   Procedure: Coronary Stent Intervention;  Surgeon: [name], MD;  Location: [place] [facility];  Service: Cardiovascular;  Laterality: N/A;  Mid CX 3.50x12 promus DES w/ 0% resdual and Prox OM1 2.50x20 promus DES w/ 20% residual  . CARDIOVERSION    . CARPAL TUNNEL RELEASE Right Nov 2015  . CARPAL TUNNEL RELEASE Right 1992; 05/2014   [name]; [place]  . CESAREAN SECTION  1982; 1984  . FOOT NEUROMA SURGERY Bilateral 2000  . KNEE ARTHROSCOPY Right ~ 2003   "meniscus repair"  . SHOULDER OPEN ROTATOR CUFF REPAIR Right 1996; 1998   "w/fracture repair"  . THYROID SURGERY  2000   "removed lots of nodules"  . TONSILLECTOMY  1976    Current Outpatient Medications  Medication Sig Dispense Refill  . albuterol (PROVENTIL HFA) 108 (90 Base) MCG/ACT inhaler Inhale  1-2 puffs into the lungs every 6 (six) hours as needed for wheezing or shortness of breath. 1 Inhaler 0  . albuterol (PROVENTIL) (2.5 MG/3ML) 0.083% nebulizer solution Take 3 mLs (2.5 mg total) by nebulization every 6 (six) hours as needed for wheezing or shortness of breath. 150 mL 0  . Alcohol Swabs (B-D SINGLE USE SWABS REGULAR) PADS 1 each by Does not apply route 2 (two) times daily. 100 each 2  . amLODipine (NORVASC) 10 MG tablet Take 1 tablet (10 mg total) by mouth daily. 90 tablet 3  . apixaban (ELIQUIS) 5 MG TABS tablet Take 1 tablet (5 mg  total) by mouth 2 (two) times daily. 180 tablet 3  . atorvastatin (LIPITOR) 20 MG tablet Take 1 tablet (20 mg total) by mouth daily. 90 tablet 3  . Blood Glucose Calibration (TRUE METRIX LEVEL 1) Low SOLN 1 each by In Vitro route as needed. 1 each 0  . Blood Glucose Monitoring Suppl (TRUE METRIX AIR GLUCOSE METER) w/Device KIT 1 each by Does not apply route daily. 1 kit 0  . buPROPion (WELLBUTRIN) 75 MG tablet Take 1 tablet (75 mg total) by mouth every 6 (six) hours. 180 tablet 3  . fenofibrate (TRICOR) 145 MG tablet Take 1 tablet (145 mg total) by mouth daily. 90 tablet 3  . FLUoxetine (PROZAC) 20 MG capsule Take 1 capsule (20 mg total) by mouth at bedtime. 90 capsule 2  . gabapentin (NEURONTIN) 300 MG capsule Take 3 capsules (900 mg total) by mouth 2 (two) times daily. TAKE 3 CAPSULES BY MOUTH EVERY  MORNING AND 3 CAPSULES AT BEDTIME 540 capsule 0  . glucose blood (TRUE METRIX BLOOD GLUCOSE TEST) test strip Used to check blood sugars 2x daily. 100 each 12  . insulin degludec (TRESIBA FLEXTOUCH) 100 UNIT/ML SOPN FlexTouch Pen Inject 0.2 mLs (20 Units total) into the skin daily at 10 pm. 15 mL 0  . Insulin Pen Needle 32G X 4 MM MISC Use to inject insulin daily 100 each 5  . Insulin Syringe-Needle U-100 (INSULIN SYRINGE .5CC/31GX5/16") 31G X 5/16" 0.5 ML MISC Use to inject insulin 100 each 5  . magnesium oxide (MAG-OX) 400 (241.3 Mg) MG tablet Take 1 tablet (400 mg total) by mouth daily. 30 tablet 6  . metFORMIN (GLUCOPHAGE-XR) 500 MG 24 hr tablet Take 4 tablets (2,000 mg total) by mouth daily with breakfast. (Patient taking differently: Take 1,000 mg by mouth 2 (two) times daily. ) 360 tablet 3  . methimazole (TAPAZOLE) 10 MG tablet Take 1 tablet (10 mg total) by mouth daily. 30 tablet 11  . metoprolol succinate (TOPROL-XL) 25 MG 24 hr tablet Take 1 tablet (25 mg total) by mouth 2 (two) times daily. 180 tablet 3  . nitroGLYCERIN (NITROSTAT) 0.4 MG SL tablet Place 1 tablet (0.4 mg total) under the  tongue every 5 (five) minutes as needed for chest pain. 25 tablet 3  . pantoprazole (PROTONIX) 40 MG tablet Take 1 tablet (40 mg total) by mouth daily. 90 tablet 0  . potassium chloride SA (K-DUR,KLOR-CON) 20 MEQ tablet Take 1 tablet (20 mEq total) by mouth 2 (two) times daily. 15 tablet 0  . silver sulfADIAZINE (SILVADENE) 1 % cream Apply 1 application topically daily. 50 g 1  . sotalol (BETAPACE) 120 MG tablet Take 1 tablet (120 mg total) by mouth every 12 (twelve) hours. 180 tablet 3  . SSD 1 % cream APPLY TO AFFECTED AREA ONCE DAILY 50 g 0  . traMADol (ULTRAM) 50  MG tablet Take 1 tablet (50 mg total) by mouth every 8 (eight) hours as needed. (Patient taking differently: Take 50 mg by mouth every 8 (eight) hours as needed for moderate pain. ) 90 tablet 5  . triamcinolone cream (KENALOG) 0.1 % Apply 1 application topically 2 (two) times daily. 30 g 0  . TRUEPLUS LANCETS 28G MISC 1 each by Does not apply route 2 (two) times daily. 100 each 2   No current facility-administered medications for this encounter.     Allergies  Allergen Reactions  . Contrast Media [Iodinated Diagnostic Agents] Hives    Spoke to patient, Iodine allergy is really IV contrast allergy.   . Dilaudid [Hydromorphone Hcl] Other (See Comments)    HEADACHE  . Novolog [Insulin Aspart] Shortness Of Breath    "breathing problems"  . Codeine Nausea And Vomiting    HIGH DOSES-SEVERE VOMITING  . Iodine Other (See Comments)    MUST HAVE BENADRYL PRIOR TO PROCEDURE AND RIGHT BEFORE TREATMENT TO COUNTERACT REACTION-BLISTERING REACTION DERMATOLOGICAL  . Penicillins Itching, Rash and Other (See Comments)    Has patient had a PCN reaction causing immediate rash, facial/tongue/throat swelling, SOB or lightheadedness with hypotension: Yes Has patient had a PCN reaction causing severe rash involving mucus membranes or skin necrosis: No Has patient had a PCN reaction that required hospitalization No Has patient had a PCN reaction  occurring within the last 10 years: No If all of the above answers are "NO", then may proceed with Cephalosporin use.  CHEST SIZED RASH AND ITCHING   . Propofol Other (See Comments)    "Breathing problems - asthma attack" Can take with benadryl   . Ace Inhibitors Cough  . Demerol [Meperidine] Nausea And Vomiting  . Neosporin [Neomycin-Bacitracin Zn-Polymyx] Itching and Rash    MAKES REACTIONS WORSE WHEN USING AS PROPHYLACTIC  . Percocet [Oxycodone-Acetaminophen] Rash  . Tape Itching and Rash    Social History   Socioeconomic History  . Marital status: Single    Spouse name: Not on file  . Number of children: 2  . Years of education: Masters  . Highest education level: Not on file  Occupational History  . Occupation: Landscape architect  Social Needs  . Financial resource strain: Somewhat hard  . Food insecurity:    Worry: Sometimes true    Inability: Sometimes true  . Transportation needs:    Medical: No    Non-medical: No  Tobacco Use  . Smoking status: Former Smoker    Packs/day: 0.00    Years: 41.00    Pack years: 0.00    Types: Cigarettes    Last attempt to quit: 03/05/2015    Years since quitting: 2.6  . Smokeless tobacco: Never Used  . Tobacco comment: 04/29/2015 "quit smoking cigarettes 02/27/2015"  Substance and Sexual Activity  . Alcohol use: No  . Drug use: No  . Sexual activity: Not Currently    Birth control/protection: Post-menopausal, Surgical  Lifestyle  . Physical activity:    Days per week: 0 days    Minutes per session: 0 min  . Stress: Very much  Relationships  . Social connections:    Talks on phone: More than three times a week    Gets together: More than three times a week    Attends religious service: More than 4 times per year    Active member of club or organization: No    Attends meetings of clubs or organizations: Never    Relationship status: Never married  .  Intimate partner violence:    Fear of current or ex partner: No     Emotionally abused: No    Physically abused: No    Forced sexual activity: No  Other Topics Concern  . 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      Lives with sister in [place].      Tobacco: 1 ppd x 41 years - quit 2016      Alcohol: none      Drugs: none      Exercise:  Walking in neighborhood; physical job.   Right-handed.   2 cups caffeine daily.    Family History  Problem Relation Age of Onset  . Cancer Mother 78       bronchial cancer  . Breast cancer Mother   . Lung cancer Mother   . Hypertension Father   . COPD Father   . Heart disease Father 55       CAD with cardiac stenting  . Heart attack Father   . Parkinson's disease Father   . Allergies Sister   . Breast cancer Maternal Grandmother   . Emphysema Maternal Grandfather   . Leukemia Paternal Grandmother   . Emphysema Paternal Grandfather   . Thyroid disease Neg Hx     ROS- All systems are reviewed and negative except as per the HPI above  Physical Exam: Vitals:   11/02/17 1127  BP: 136/74  Pulse: 66  Weight: 176 lb (79.8 kg)  Height: '5\' 4"'$  (1.626 m)   Wt Readings from Last 3 Encounters:  11/02/17 176 lb (79.8 kg)  10/28/17 177 lb (80.3 kg)  10/21/17 181 lb 6.4 oz (82.3 kg)    Labs: Lab Results  Component Value Date   NA 132 (L) 11/01/2017   K 4.1 11/01/2017   CL 95 (L) 11/01/2017   CO2 25 11/01/2017   GLUCOSE 477 (H) 11/01/2017   BUN 9 11/01/2017   CREATININE 1.02 (H) 11/01/2017   CALCIUM 9.3 11/01/2017   MG 1.5 (L) 11/01/2017   Lab Results  Component Value Date   INR 1.00 08/05/2017   Lab Results  Component Value Date   CHOL 236 (H) 07/22/2017   HDL 30 (L) 07/22/2017   Judith Comment 07/22/2017   TRIG 544 (H) 07/22/2017     GEN- The patient is  well appearing, alert and oriented x 3 today.   Head- normocephalic, atraumatic Eyes-  Sclera clear, conjunctiva pink Ears- hearing intact Oropharynx- clear Neck- supple, no JVP Lymph- no cervical lymphadenopathy Lungs- Clear to ausculation bilaterally, normal work of breathing Heart- Regular rate and rhythm, no murmurs, rubs or gallops, PMI not laterally displaced GI- soft, NT, ND, + BS Extremities- no clubbing, cyanosis, or edema MS- no significant deformity or atrophy Skin- no rash or lesion Psych- euthymic mood, full affect Neuro- strength and sensation are intact  EKG-NSR at 66 bpm, Pr int 208 ms, qrs int 82 ms, qtc 452 ms Echo-Study Conclusions  - Left ventricle: The cavity size was normal. There was mild   concentric hypertrophy. Systolic function was normal. The   estimated ejection fraction was in the range of 55% to 60%. Wall   motion was normal; there were no regional wall motion   abnormalities. - Aortic  valve: Valve mobility was restricted. There was moderate   stenosis. Valve area (VTI): 1.46 cm^2. Valve area (Vmax): 1.42   cm^2. Valve area (Vmean): 1.42 cm^2. - Mitral valve: Calcified annulus. There was mild regurgitation   directed centrally. - Left atrium: The atrium was moderately dilated. 46 mm     Assessment and Plan: 1. Paroxysmal afib Pt has had recent breakthrough of afib on sotalol Discussed options of change in antiarrythmic or ablation Antiarrythmic best option would be Tikosyn as she is on the young side for amiodarone and has hyperthyroidism , CAD would negate use of flecainide Ablation also discussed and pt would like to discuss this further with Dr. Rayann Heman For now, continue sotalol and metoprolol 25 mg bid  2. Newly diagnosed severe sleep apnea Pt believes to be starting BiPAP shortly This may be a big contributor to her afib burden so she was encouraged to get started as soon as possible She most often has start of afib after she has been  asleep for an hour  3. HTN  Stable   Appointment requested with Dr. Rayann Heman in 6 weeks, to allow pt to start Cpap  Butch Penny C. Noelle [name], [facility] [address] [place], [place] [address] [phone]

## 2017-11-02 NOTE — Telephone Encounter (Signed)
I called patient back, but VM was full. I have submitted PA request & am waiting on determination.

## 2017-11-02 NOTE — Telephone Encounter (Signed)
I called patient & she stated that last night she was taken to ER again. They gave her novolog insulin which causes her not to be able to breathe. She is severly allergic & I need to do PA for humaoog ASAP. I stated to patient when PA was done I would let her know.

## 2017-11-03 NOTE — Telephone Encounter (Signed)
I have called [employer] & they are reviewing my appeal that was submitted. The representative stated that this may take 2-3 days to review. We should receive a determination. I also have fax number to fax over chart notes [phone].

## 2017-11-04 ENCOUNTER — Encounter (HOSPITAL_COMMUNITY): Payer: Self-pay

## 2017-11-04 ENCOUNTER — Emergency Department (HOSPITAL_COMMUNITY)
Admission: EM | Admit: 2017-11-04 | Discharge: 2017-11-04 | Disposition: A | Discharge status: Home / Self Care | Payer: Medicare PPO | Attending: Emergency Medicine | Admitting: Emergency Medicine

## 2017-11-04 ENCOUNTER — Emergency Department (HOSPITAL_COMMUNITY): Payer: Medicare PPO

## 2017-11-04 DIAGNOSIS — I2510 Atherosclerotic heart disease of native coronary artery without angina pectoris: Secondary | ICD-10-CM | POA: Diagnosis not present

## 2017-11-04 DIAGNOSIS — I4891 Unspecified atrial fibrillation: Secondary | ICD-10-CM

## 2017-11-04 DIAGNOSIS — I480 Paroxysmal atrial fibrillation: Secondary | ICD-10-CM | POA: Diagnosis not present

## 2017-11-04 DIAGNOSIS — R0789 Other chest pain: Secondary | ICD-10-CM | POA: Diagnosis not present

## 2017-11-04 DIAGNOSIS — R079 Chest pain, unspecified: Secondary | ICD-10-CM | POA: Diagnosis not present

## 2017-11-04 DIAGNOSIS — Z794 Long term (current) use of insulin: Secondary | ICD-10-CM | POA: Insufficient documentation

## 2017-11-04 DIAGNOSIS — E1122 Type 2 diabetes mellitus with diabetic chronic kidney disease: Secondary | ICD-10-CM | POA: Diagnosis not present

## 2017-11-04 DIAGNOSIS — Z7901 Long term (current) use of anticoagulants: Secondary | ICD-10-CM | POA: Diagnosis not present

## 2017-11-04 DIAGNOSIS — Z79899 Other long term (current) drug therapy: Secondary | ICD-10-CM | POA: Insufficient documentation

## 2017-11-04 DIAGNOSIS — R110 Nausea: Secondary | ICD-10-CM | POA: Diagnosis not present

## 2017-11-04 DIAGNOSIS — E0590 Thyrotoxicosis, unspecified without thyrotoxic crisis or storm: Secondary | ICD-10-CM | POA: Insufficient documentation

## 2017-11-04 DIAGNOSIS — N189 Chronic kidney disease, unspecified: Secondary | ICD-10-CM | POA: Diagnosis not present

## 2017-11-04 DIAGNOSIS — I129 Hypertensive chronic kidney disease with stage 1 through stage 4 chronic kidney disease, or unspecified chronic kidney disease: Secondary | ICD-10-CM | POA: Insufficient documentation

## 2017-11-04 DIAGNOSIS — Z87891 Personal history of nicotine dependence: Secondary | ICD-10-CM | POA: Diagnosis not present

## 2017-11-04 DIAGNOSIS — I499 Cardiac arrhythmia, unspecified: Secondary | ICD-10-CM | POA: Diagnosis not present

## 2017-11-04 DIAGNOSIS — R0602 Shortness of breath: Secondary | ICD-10-CM | POA: Diagnosis not present

## 2017-11-04 LAB — I-STAT TROPONIN, ED
Troponin i, poc: 0.01 ng/mL (ref 0.00–0.08)
Troponin i, poc: 0.01 ng/mL (ref 0.00–0.08)

## 2017-11-04 LAB — CBC
HCT: 38.3 % (ref 36.0–46.0)
Hemoglobin: 11.5 g/dL — ABNORMAL LOW (ref 12.0–15.0)
MCH: 27.2 pg (ref 26.0–34.0)
MCHC: 30 g/dL (ref 30.0–36.0)
MCV: 90.5 fL (ref 78.0–100.0)
Platelets: 535 10*3/uL — ABNORMAL HIGH (ref 150–400)
RBC: 4.23 MIL/uL (ref 3.87–5.11)
RDW: 14 % (ref 11.5–15.5)
WBC: 9.1 10*3/uL (ref 4.0–10.5)

## 2017-11-04 LAB — BASIC METABOLIC PANEL
Anion gap: 10 (ref 5–15)
BUN: 17 mg/dL (ref 8–23)
CO2: 24 mmol/L (ref 22–32)
Calcium: 9.1 mg/dL (ref 8.9–10.3)
Chloride: 102 mmol/L (ref 98–111)
Creatinine, Ser: 1.35 mg/dL — ABNORMAL HIGH (ref 0.44–1.00)
GFR calc Af Amer: 47 mL/min — ABNORMAL LOW (ref >60)
GFR calc non Af Amer: 40 mL/min — ABNORMAL LOW (ref >60)
Glucose, Bld: 268 mg/dL — ABNORMAL HIGH (ref 70–99)
Potassium: 4.6 mmol/L (ref 3.5–5.1)
Sodium: 136 mmol/L (ref 135–145)

## 2017-11-04 LAB — MAGNESIUM: Magnesium: 1.3 mg/dL — ABNORMAL LOW (ref 1.7–2.4)

## 2017-11-04 MED ORDER — MAGNESIUM SULFATE 4 GM/100ML IV SOLN
4.0000 g | Freq: Once | INTRAVENOUS | Status: AC
  Administered 2017-11-04: 4 g via INTRAVENOUS
  Filled 2017-11-04: qty 100

## 2017-11-04 MED ORDER — DILTIAZEM HCL-DEXTROSE 100-5 MG/100ML-% IV SOLN (PREMIX)
5.0000 mg/h | Freq: Once | INTRAVENOUS | Status: AC
  Administered 2017-11-04: 5 mg/h via INTRAVENOUS
  Filled 2017-11-04: qty 100

## 2017-11-04 NOTE — ED Provider Notes (Signed)
[facility] EMERGENCY DEPARTMENT Provider Note   CSN: [id number] Arrival date & time: 11/04/17  1700     History   Chief Complaint Chief Complaint  Patient presents with  . Chest Pain    HPI Judith [name] is a 67 y.o. female with history of paroxysmal A. fib, hypertension, hyperlipidemia, type 2 diabetes mellitus, CKD, diabetic peripheral neuropathy presents for evaluation of acute onset, progressively worsening palpitations, chest pain since earlier today.  She states she has been going in and out of A. fib more frequently, last seen for this 2 days ago where and she spontaneously converted while in the ED.  She followed up with the A. fib clinic yesterday and there was discussion of medication changes but no definitive actions were taken.  States that earlier today at around 1:30 PM while she was sitting in a recliner she began developing her usual A. fib related symptoms.  She states that she began feeling nauseated and had burning pain radiating from shoulder to shoulder traveling across the anterior superior aspect of the chest.  Symptoms worsened with walking and talking.  She endorses nausea but no vomiting.  She felt lightheaded which worsened with position changes.  Denies syncope.  No fevers or chills.  She does feel short of breath but states "I always feel short of breath ".  She states that she waited 1 hour but her symptoms persisted so she took another tablet of her sotalol, extended release tablet of metoprolol, and sublingual nitroglycerin.  Her symptoms continued to persist and worsen and so she called EMS for presentation to the ED.  She was given 4 baby aspirin, 300 mL normal saline bolus, 1 sublingual nitroglycerin tablet, and 15 mg of Lopressor IV (three 31m doses).  She notes modest improvement.  EKG with EMS showed A. fib with runs of V. Tach.   CHA2DS2/VAS Stroke Risk Points  Current as of 27 minutes ago     5 >= 2 Points: High Risk  1 - 1.99 Points:  Medium Risk  0 Points: Low Risk    The patient's score has not changed in the past year.:  No Change     Details    This score determines the patient's risk of having a stroke if the  patient has atrial fibrillation.       Points Metrics  0 Has Congestive Heart Failure:  No    Current as of 27 minutes ago  1 Has Vascular Disease:  Yes    Current as of 27 minutes ago  1 Has Hypertension:  Yes    Current as of 27 minutes ago  1 Age:  89   Current as of 27 minutes ago  1 Has Diabetes:  Yes    Current as of 27 minutes ago  0 Had Stroke:  No  Had TIA:  No  Had thromboembolism:  No    Current as of 27 minutes ago  1 Female:  Yes    Current as of 27 minutes ago    The history is provided by the patient.    Past Medical History:  Diagnosis Date  . Abnormal EKG 07/31/2013  . Arthritis    "hands" (03/06/2015)  . Asthma   . Carpal tunnel syndrome, bilateral   . Chronic kidney disease   . Complication of anesthesia    slow to wake up  . Coronary artery disease     2 v CAD with CTO of the RCA and high  grade bifurcational LCx/OM stenosis. S/P PCI DES x 2 to the LCx/OM.  Marland Kitchen Diabetic peripheral neuropathy ([place]) "since 1996"  . GERD (gastroesophageal reflux disease)   . Goiter   . Headache    migraines prior to menopause  . History of shingles 06/01/2013  . Hyperlipidemia LDL goal <70 10/13/2015  . Hypertension   . Hyperthyroidism   . PAF (paroxysmal atrial fibrillation) ([place]) 04/29/2015   CHADS2VASC score of 4 now on Apixaban  . Pneumonia ~ 1976  . Tremors of nervous system   . Type II diabetes mellitus (HCC)    insulin dependent    Patient Active Problem List   Diagnosis Date Noted  . LGI bleed   . Acute blood loss anemia   . Wide-complex tachycardia ([place]) 07/04/2017  . Type II diabetes mellitus, uncontrolled ([place]) 07/04/2017  . Peripheral neuropathy 07/04/2017  . H/O hyperthyroidism 07/04/2017  . S/P transmetatarsal amputation of foot, right ([place]) 04/19/2016  .  Obstructive sleep apnea 11/26/2015  . Bilateral carpal tunnel syndrome 11/26/2015  . Hypomagnesemia 11/16/2015  . Hyperlipidemia LDL goal <70 10/13/2015  . Abnormality of gait 09/02/2015  . Memory loss 08/12/2015  . Diabetic peripheral neuropathy ([place]) 08/12/2015  . Vitamin D deficiency 08/12/2015  . Hyperthyroidism 04/30/2015  . Heme positive stool   . Persistent atrial fibrillation ([place]) 04/29/2015  . Coronary artery disease with stable angina pectoris ([place]) 03/29/2015  . Abnormal nuclear stress test   . History of goiter 09/28/2014  . GERD (gastroesophageal reflux disease) 07/31/2013  . Depression with anxiety 06/01/2013  . DM2 (diabetes mellitus, type 2) ([place]) 01/30/2013  . HTN (hypertension) 01/30/2013  . Tobacco user 01/30/2013    Past Surgical History:  Procedure Laterality Date  . ABDOMINAL HYSTERECTOMY  1988   age 27; CERVICAL DYSPLASIA; ovaries intact.   . AMPUTATION Right 01/23/2016   Procedure: Right 3rd Ray Amputation;  Surgeon: [name], MD;  Location: [facility];  Service: Orthopedics;  Laterality: Right;  . AMPUTATION Right 02/13/2016   Procedure: Right Transmetatarsal Amputation;  Surgeon: [name], MD;  Location: [facility];  Service: Orthopedics;  Laterality: Right;  . CARDIAC CATHETERIZATION N/A 02/27/2015   Procedure: Left Heart Cath and Coronary Angiography;  Surgeon: [name], MD; LAD 40%, mCFX 80%, OM 70%, RCA 100% calcified       . CARDIAC CATHETERIZATION N/A 03/06/2015   Procedure: Coronary Stent Intervention;  Surgeon: [name], MD;  Location: [facility];  Service: Cardiovascular;  Laterality: N/A;  Mid CX 3.50x12 promus DES w/ 0% resdual and Prox OM1 2.50x20 promus DES w/ 20% residual  . CARDIOVERSION    . CARPAL TUNNEL RELEASE Right Nov 2015  . CARPAL TUNNEL RELEASE Right 1992; 05/2014   [name]; [place]  . CESAREAN SECTION  1982; 1984  . FOOT NEUROMA SURGERY Bilateral 2000  . KNEE ARTHROSCOPY Right ~ 2003   "meniscus repair"    . SHOULDER OPEN ROTATOR CUFF REPAIR Right 1996; 1998   "w/fracture repair"  . THYROID SURGERY  2000   "removed lots of nodules"  . TONSILLECTOMY  1976     OB History   None      Home Medications    Prior to Admission medications   Medication Sig Start Date End Date Taking? Authorizing Provider  albuterol (PROVENTIL HFA) 108 (90 Base) MCG/ACT inhaler Inhale 1-2 puffs into the lungs every 6 (six) hours as needed for wheezing or shortness of breath. 11/07/15   [name], MD  albuterol (PROVENTIL) (  2.5 MG/3ML) 0.083% nebulizer solution Take 3 mLs (2.5 mg total) by nebulization every 6 (six) hours as needed for wheezing or shortness of breath. 08/31/17   [name], PA-C  Alcohol Swabs (B-D SINGLE USE SWABS REGULAR) PADS 1 each by Does not apply route 2 (two) times daily. 08/29/17   Renato Shin, MD  amLODipine (NORVASC) 10 MG tablet Take 1 tablet (10 mg total) by mouth daily. 08/22/17   Sueanne Margarita, MD  apixaban (ELIQUIS) 5 MG TABS tablet Take 1 tablet (5 mg total) by mouth 2 (two) times daily. 08/17/17   Sueanne Margarita, MD  atorvastatin (LIPITOR) 20 MG tablet Take 1 tablet (20 mg total) by mouth daily. 08/22/17   Sueanne Margarita, MD  Blood Glucose Calibration (TRUE METRIX LEVEL 1) Low SOLN 1 each by In Vitro route as needed. 08/25/17   Renato Shin, MD  Blood Glucose Monitoring Suppl (TRUE METRIX AIR GLUCOSE METER) w/Device KIT 1 each by Does not apply route daily. 08/25/17   Renato Shin, MD  buPROPion (WELLBUTRIN) 75 MG tablet Take 1 tablet (75 mg total) by mouth every 6 (six) hours. 10/07/17   [name], PA-C  fenofibrate (TRICOR) 145 MG tablet Take 1 tablet (145 mg total) by mouth daily. 08/22/17 08/17/18  Sueanne Margarita, MD  FLUoxetine (PROZAC) 20 MG capsule Take 1 capsule (20 mg total) by mouth at bedtime. 08/31/17   [name], PA-C  gabapentin (NEURONTIN) 300 MG capsule Take 3 capsules (900 mg total) by mouth 2 (two) times daily. TAKE 3 CAPSULES BY MOUTH EVERY   MORNING AND 3 CAPSULES AT BEDTIME 08/31/17   Philis Fendt L, PA-C  glucose blood (TRUE METRIX BLOOD GLUCOSE TEST) test strip Used to check blood sugars 2x daily. 08/25/17   Renato Shin, MD  insulin degludec (TRESIBA FLEXTOUCH) 100 UNIT/ML SOPN FlexTouch Pen Inject 0.2 mLs (20 Units total) into the skin daily at 10 pm. 07/05/17   Cristal Ford, DO  Insulin Pen Needle 32G X 4 MM MISC Use to inject insulin daily 08/13/16   Renato Shin, MD  Insulin Syringe-Needle U-100 (INSULIN SYRINGE .5CC/31GX5/16") 31G X 5/16" 0.5 ML MISC Use to inject insulin 08/13/16   Renato Shin, MD  magnesium oxide (MAG-OX) 400 (241.3 Mg) MG tablet Take 1 tablet (400 mg total) by mouth daily. 10/07/17   [name], PA-C  metFORMIN (GLUCOPHAGE-XR) 500 MG 24 hr tablet Take 4 tablets (2,000 mg total) by mouth daily with breakfast. Patient taking differently: Take 1,000 mg by mouth 2 (two) times daily.  08/25/17   Renato Shin, MD  methimazole (TAPAZOLE) 10 MG tablet Take 1 tablet (10 mg total) by mouth daily. 08/25/17   Renato Shin, MD  metoprolol succinate (TOPROL-XL) 25 MG 24 hr tablet Take 1 tablet (25 mg total) by mouth 2 (two) times daily. 08/22/17   Sueanne Margarita, MD  nitroGLYCERIN (NITROSTAT) 0.4 MG SL tablet Place 1 tablet (0.4 mg total) under the tongue every 5 (five) minutes as needed for chest pain. 02/10/17   [name], PA-C  pantoprazole (PROTONIX) 40 MG tablet Take 1 tablet (40 mg total) by mouth daily. 08/31/17   [name], PA-C  potassium chloride SA (K-DUR,KLOR-CON) 20 MEQ tablet Take 1 tablet (20 mEq total) by mouth 2 (two) times daily. 10/28/17   Pattricia Boss, MD  silver sulfADIAZINE (SILVADENE) 1 % cream Apply 1 application topically daily. 07/01/17   Trula Slade, DPM  sotalol (BETAPACE) 120 MG tablet Take 1  tablet (120 mg total) by mouth every 12 (twelve) hours. 08/22/17   Sueanne Margarita, MD  SSD 1 % cream APPLY TO AFFECTED AREA ONCE DAILY 03/11/17   [name], MD  traMADol (ULTRAM)  50 MG tablet Take 1 tablet (50 mg total) by mouth every 8 (eight) hours as needed. Patient taking differently: Take 50 mg by mouth every 8 (eight) hours as needed for moderate pain.  08/31/17   [name], PA-C  triamcinolone cream (KENALOG) 0.1 % Apply 1 application topically 2 (two) times daily. 10/07/17   [name], PA-C  TRUEPLUS LANCETS 28G MISC 1 each by Does not apply route 2 (two) times daily. 08/29/17   Renato Shin, MD    Family History Family History  Problem Relation Age of [place]set  . Cancer Mother 28       bronchial cancer  . Breast cancer Mother   . Lung cancer Mother   . Hypertension Father   . COPD Father   . Heart disease Father 10       CAD with cardiac stenting  . Heart attack Father   . Parkinson's disease Father   . Allergies Sister   . Breast cancer Maternal Grandmother   . Emphysema Maternal Grandfather   . Leukemia Paternal Grandmother   . Emphysema Paternal Grandfather   . Thyroid disease Neg Hx     Social History Social History   Tobacco Use  . Smoking status: Former Smoker    Packs/day: 0.00    Years: 41.00    Pack years: 0.00    Types: Cigarettes    Last attempt to quit: 03/05/2015    Years since quitting: 2.6  . Smokeless tobacco: Never Used  . Tobacco comment: 04/29/2015 "quit smoking cigarettes 02/27/2015"  Substance Use Topics  . Alcohol use: No  . Drug use: No     Allergies   Contrast media [iodinated diagnostic agents]; Dilaudid [hydromorphone hcl]; Novolog [insulin aspart]; Codeine; Iodine; Penicillins; Propofol; Ace inhibitors; Demerol [meperidine]; Neosporin [neomycin-bacitracin zn-polymyx]; Percocet [oxycodone-acetaminophen]; and Tape   Review of Systems Review of Systems  Constitutional: Positive for diaphoresis.  Respiratory: Positive for shortness of breath.   Cardiovascular: Positive for chest pain.  Gastrointestinal: Positive for nausea. Negative for vomiting.  Neurological: Positive for light-headedness.  Negative for syncope.  All other systems reviewed and are negative.    Physical Exam Updated Vital Signs BP 128/81   Pulse (!) 52   Temp 99 F (37.2 C) (Oral)   Resp 16   SpO2 98%   Physical Exam  Constitutional: She appears well-developed and well-nourished. No distress.  Resting comfortably in bed, mildly diaphoretic  HENT:  Head: Normocephalic and atraumatic.  Eyes: Conjunctivae are normal. Right eye exhibits no discharge. Left eye exhibits no discharge.  Neck: No JVD present. No tracheal deviation present.  Cardiovascular: An irregularly irregular rhythm present. Tachycardia present.  Pulses:      Carotid pulses are 2+ on the right side, and 2+ on the left side.      Radial pulses are 2+ on the right side, and 2+ on the left side.       Dorsalis pedis pulses are 2+ on the right side, and 2+ on the left side.       Posterior tibial pulses are 2+ on the right side, and 2+ on the left side.  Pulmonary/Chest: No accessory muscle usage or stridor. Tachypnea noted. No respiratory distress. She has rales in the right lower field and the  left lower field.  Mildly tachypneic, speaking in full sentences without difficulty.  Equal rise and fall of chest.  Bibasilar crackles noted.  Abdominal: Soft. Bowel sounds are normal. She exhibits no distension. There is no tenderness.  Musculoskeletal: She exhibits no edema.       Right lower leg: She exhibits no tenderness and no edema.       Left lower leg: She exhibits no tenderness and no edema.  Neurological: She is alert.  Skin: Skin is warm and dry. No erythema.  Psychiatric: She has a normal mood and affect. Her behavior is normal.  Nursing note and vitals reviewed.    ED Treatments / Results  Labs (all labs ordered are listed, but only abnormal results are displayed) Labs Reviewed  BASIC METABOLIC PANEL - Abnormal; Notable for the following components:      Result Value   Glucose, Bld 268 (*)    Creatinine, Ser 1.35 (*)    GFR  calc non Af Amer 40 (*)    GFR calc Af Amer 47 (*)    All other components within normal limits  CBC - Abnormal; Notable for the following components:   Hemoglobin 11.5 (*)    Platelets 535 (*)    All other components within normal limits  MAGNESIUM - Abnormal; Notable for the following components:   Magnesium 1.3 (*)    All other components within normal limits  I-STAT TROPONIN, ED  I-STAT TROPONIN, ED    EKG ED ECG REPORT   Date: 11/04/2017 @ 17:02  Rate: 142 BPM  Rhythm: atrial fibrillation  QRS Axis: normal  ST/T Wave abnormalities: nonspecific ST changes  Conduction Disutrbances:none  Narrative Interpretation:   Old EKG Reviewed: changes noted  I have personally reviewed the EKG tracing and agree with the computerized printout as noted.  ED ECG REPORT   Date: 11/04/2017 @ 20:27  Rate: 56 BPM  Rhythm: normal sinus rhythm  QRS Axis: normal  Intervals: PR prolonged  ST/T Wave abnormalities: normal  Conduction Disutrbances:none  Narrative Interpretation:   Old EKG Reviewed: changes noted  I have personally reviewed the EKG tracing and agree with the computerized printout as noted.   Radiology Dg Chest Port 1 View  Result Date: 11/04/2017 CLINICAL DATA:  Chest pain and shortness of breath EXAM: PORTABLE CHEST 1 VIEW COMPARISON:  11/01/2017 FINDINGS: The heart size and mediastinal contours are within normal limits. Both lungs are clear. The visualized skeletal structures are unremarkable. IMPRESSION: No active disease. Electronically Signed   By: Inez Catalina M.D.   On: 11/04/2017 17:58    Procedures Procedures (including critical care time)  Medications Ordered in ED Medications  diltiazem (CARDIZEM) 100 mg in dextrose 5% 151m (1 mg/mL) infusion (0 mg/hr Intravenous Stopped 11/04/17 2025)  magnesium sulfate IVPB 4 g 100 mL (0 g Intravenous Stopped 11/04/17 2046)     Initial Impression / Assessment and Plan / ED Course  I have reviewed the triage vital signs  and the nursing notes.  Pertinent labs & imaging results that were available during my care of the patient were reviewed by me and considered in my medical decision making (see chart for details).     Patient presents for evaluation of A. fib with RVR. CHADSVACS score is 5.  This is the third time this week but she presents this way.  Endorses nausea, neck/anterior chest pain, lightheadedness, and shortness of breath.  This is consistent with her usual A. fib with RVR episodes.  She is  afebrile, heart rate is initially between 120 to 140 bpm.  Initial EKG shows A. fib with RVR.  Patient is uncomfortable but nontoxic in appearance.  Lab work reviewed by me shows mild stable anemia, creatinine mildly elevated but this is the patient's baseline.   Also significant for magnesium of 1.3.  She appears to be chronically hypomagnesemic.  She was given 4 g bolus in the ED. Serial troponins are negative, no evidence of ACS/MI.  Chest x-ray shows no acute cardiopulmonary abnormalities.  Doubt PE.  Patient was started on a Cardizem drip, converted to normal sinus rhythm while in the ED. 9:56 PM  Spoke with Dr. Pasty Arch with Cardiology.  He does not recommend making any medication changes at this time but he will be in contact with Dr. Rayann Heman and his office to set up follow-up for the patient with the EP team this week on an outpatient basis.  Patient is resting comfortably, tolerating p.o. fluids in the ED.  She states she feels much better and does not wish to be admitted.  She would like to be discharged home.  She was ambulated without difficulty, no lightheadedness. She is hemodynamically stable for discharge home with close follow up with Dr. Radford Pax (her cardiologist) and Dr. Rayann Heman. Discussed strict ED return precautions. Patient and patient's daughter and friend verbalized understanding of and agreement with plan and is stable for discharge home at this time.   Final Clinical Impressions(s) / ED Diagnoses    Final diagnoses:  Atrial fibrillation with RVR [facility]    ED Discharge Orders    None       Debroah Baller 11/05/17 0141    Julianne Rice, MD 11/08/17 [id number]

## 2017-11-04 NOTE — ED Notes (Signed)
Heart rate dropped to 55 bpm.  Cardizem drips stopped at this time.  PA at bedside

## 2017-11-04 NOTE — ED Triage Notes (Signed)
Pt arrived via [facility]; per EMS pt from [facility] with c/o CP that radiated to neck and started at 330p without activity. Pt c/o Nausea, SOB, and Dizziness; Hx of Afib and having runs of VTach; Pt rec'd 15mg  Lopressor, 1 Nitro, 324mg  of ASA and approx 300 mL NS; 110/68, 134, 100% on 2L via [place]; CBG 285

## 2017-11-04 NOTE — Discharge Instructions (Addendum)
Continue to take all of your home medications as prescribed including your magnesium supplement.  Drink plenty water and get plenty of rest.  Call Dr. Jackalyn Lombard office on Monday to set up a follow-up appointment for this week.  Dr. Sharlette Dense the cardiologist will be in touch with Dr. Jackalyn Lombard practice to try to help set up follow-up appointment for this week.  Also make sure that you follow-up with the home health services to get your BiPAP machine.  Return to the emergency department immediately for any concerning signs or symptoms develop such as worsening pain, chest pain, palpitations, lightheadedness, vomiting, or passing out.

## 2017-11-04 NOTE — ED Notes (Signed)
Pt able to walk without assistance

## 2017-11-04 NOTE — Telephone Encounter (Signed)
Approval received humalog approved from 11/03/17-05/09/18

## 2017-11-04 NOTE — ED Notes (Signed)
ED Provider at bedside. 

## 2017-11-08 ENCOUNTER — Other Ambulatory Visit: Payer: Self-pay

## 2017-11-08 MED ORDER — INSULIN LISPRO 100 UNIT/ML (KWIKPEN): PEN_INJECTOR | SUBCUTANEOUS | 11 refills | Status: DC

## 2017-11-14 DIAGNOSIS — G4733 Obstructive sleep apnea (adult) (pediatric): Secondary | ICD-10-CM | POA: Diagnosis not present

## 2017-11-15 NOTE — Telephone Encounter (Signed)
Patient called back and I was able to get her scheduled with Ward Givens, NP on a Thursday Sept 12,2019 at 10:00 am with a 9:30 am check in. Pt verbalized understanding.

## 2017-11-15 NOTE — Telephone Encounter (Signed)
Called the patient today because received a notification that the patient was set up with [facility] for her machine. Per medicare the patient would need to be seen between 12/15/2017-02/12/18. Pt is currently scheduled for 02/28/18.  No answer, LVM for the patient to call back and reschedule between these dates.

## 2017-11-17 ENCOUNTER — Ambulatory Visit (INDEPENDENT_AMBULATORY_CARE_PROVIDER_SITE_OTHER): Payer: Medicare PPO | Admitting: Podiatry

## 2017-11-17 ENCOUNTER — Encounter: Payer: Self-pay | Admitting: Podiatry

## 2017-11-17 VITALS — BP 150/77 | HR 66 | Temp 97.6°F | Resp 16

## 2017-11-17 DIAGNOSIS — M79675 Pain in left toe(s): Secondary | ICD-10-CM | POA: Diagnosis not present

## 2017-11-17 DIAGNOSIS — S91302A Unspecified open wound, left foot, initial encounter: Secondary | ICD-10-CM | POA: Diagnosis not present

## 2017-11-17 DIAGNOSIS — M79674 Pain in right toe(s): Secondary | ICD-10-CM

## 2017-11-17 DIAGNOSIS — B351 Tinea unguium: Secondary | ICD-10-CM

## 2017-11-17 DIAGNOSIS — E1149 Type 2 diabetes mellitus with other diabetic neurological complication: Secondary | ICD-10-CM | POA: Diagnosis not present

## 2017-11-18 DIAGNOSIS — E113392 Type 2 diabetes mellitus with moderate nonproliferative diabetic retinopathy without macular edema, left eye: Secondary | ICD-10-CM | POA: Diagnosis not present

## 2017-11-18 DIAGNOSIS — H35372 Puckering of macula, left eye: Secondary | ICD-10-CM | POA: Diagnosis not present

## 2017-11-18 NOTE — Progress Notes (Signed)
Subjective: 67 year old female presents the office for follow-up evaluation of left hallux wound.  She states the area was treated for has completely healed however she did have some loose skin on her left big toe that she peeled off the nail of the new sore.  Denies any drainage or pus or any increase in swelling or redness.  Also the nails in the left foot are thickened elongated she cannot trim them herself and cause irritation inside shoes.  She says the right side is done well.  She has no other concerns. Denies any systemic complaints such as fevers, chills, nausea, vomiting. No acute changes since last appointment, and no other complaints at this time.   Objective: AAO x3, NAD Neurovascular status unchanged. On the left hallux distal portion there is a superficial granular wound present this is an abrasion type area where she peeled a piece of skin off.  There is no surrounding erythema, ascending cellulitis.  There is no fluctuation or crepitation or any malodor.  The nails are hypertrophic, dystrophic, discolored and elongated x5.  Subjectively there is tenderness to palpation 1-5 on the left foot.  There is a transmetatarsal amputation on the right foot without any wounds. No open lesions or pre-ulcerative lesions.  No pain with calf compression, swelling, warmth, erythema  Assessment: Wound left hallux with symptomatic onychomycosis  Plan: -All treatment options discussed with the patient including all alternatives, risks, complications.  -The wound that I was treating her for previously has healed however there is new area of skin breakdown.  I want her to apply small amount of antibiotic ointment and a bandage daily.  We will check her back next 2 weeks to make sure this is healing well.  The nails are sharply debrided x5 without any complications or bleeding.  Discussed the importance of daily foot inspection.  Also discussed with her not to try to pick her skin herself.   -Monitor for  any clinical signs or symptoms of infection and directed to call the office immediately should any occur or go to the ER.  Trula Slade DPM

## 2017-12-05 ENCOUNTER — Ambulatory Visit: Payer: Medicare PPO | Admitting: Endocrinology

## 2017-12-05 ENCOUNTER — Encounter: Payer: Self-pay | Admitting: Endocrinology

## 2017-12-05 VITALS — BP 132/78 | HR 78 | Ht 64.0 in | Wt 180.0 lb

## 2017-12-05 DIAGNOSIS — E1165 Type 2 diabetes mellitus with hyperglycemia: Secondary | ICD-10-CM

## 2017-12-05 LAB — POCT GLYCOSYLATED HEMOGLOBIN (HGB A1C): Hemoglobin A1C: 8.7 % — AB (ref 4.0–5.6)

## 2017-12-05 MED ORDER — INSULIN LISPRO 100 UNIT/ML (KWIKPEN): PEN_INJECTOR | SUBCUTANEOUS | 11 refills | Status: DC

## 2017-12-05 NOTE — Progress Notes (Signed)
Subjective:    Patient ID: Judith [name], female    DOB: [birth year], 67 y.o.   MRN: [id number]  HPI Pt returns for f/u of hyperthyroidism (pt says she took synthroid for a brief time in the 1990's, in an attempt to shrink a goiter; slightly suppressed TSH was first noted in late 2016, when she was in the hospital for new-onset AF; she converted back to SR; nuc med scan showed heterogeneous uptake; neck scar is from C-spine procedure). she takes tapazole as rx'ed.  pt states she feels well in general.   Pt also ret for f/u of DM: DM type: Insulin-requiring type 2. Dx'ed: 7948 Complications: polyneuropathy, nephropathy, retinopathy, and CAD.  Therapy: insulin since 1997, and metformin.   GDM: never.  DKA: never. Severe hypoglycemia: never.   Pancreatitis: never.   Other: she takes multiple daily injections; she says reg and novolog both cause sob.  Interval history: no cbg record, but states it varies from 125-150.  It is in general higher as the day goes on.   Past Medical History:  Diagnosis Date  . Abnormal EKG 07/31/2013  . Arthritis    "hands" (03/06/2015)  . Asthma   . Carpal tunnel syndrome, bilateral   . Chronic kidney disease   . Complication of anesthesia    slow to wake up  . Coronary artery disease     2 v CAD with CTO of the RCA and high grade bifurcational LCx/OM stenosis. S/P PCI DES x 2 to the LCx/OM.  Marland Kitchen Diabetic peripheral neuropathy ([place]) "since 1996"  . GERD (gastroesophageal reflux disease)   . Goiter   . Headache    migraines prior to menopause  . History of shingles 06/01/2013  . Hyperlipidemia LDL goal <70 10/13/2015  . Hypertension   . Hyperthyroidism   . PAF (paroxysmal atrial fibrillation) ([place]) 04/29/2015   CHADS2VASC score of 4 now on Apixaban  . Pneumonia ~ 1976  . Tremors of nervous system   . Type II diabetes mellitus (HCC)    insulin dependent    Past Surgical History:  Procedure Laterality Date  . ABDOMINAL HYSTERECTOMY  1988   age 78;  CERVICAL DYSPLASIA; ovaries intact.   . AMPUTATION Right 01/23/2016   Procedure: Right 3rd Ray Amputation;  Surgeon: [name], MD;  Location: [facility];  Service: Orthopedics;  Laterality: Right;  . AMPUTATION Right 02/13/2016   Procedure: Right Transmetatarsal Amputation;  Surgeon: [name], MD;  Location: [facility];  Service: Orthopedics;  Laterality: Right;  . CARDIAC CATHETERIZATION N/A 02/27/2015   Procedure: Left Heart Cath and Coronary Angiography;  Surgeon: [name], MD; LAD 40%, mCFX 80%, OM 70%, RCA 100% calcified       . CARDIAC CATHETERIZATION N/A 03/06/2015   Procedure: Coronary Stent Intervention;  Surgeon: [name], MD;  Location: [facility];  Service: Cardiovascular;  Laterality: N/A;  Mid CX 3.50x12 promus DES w/ 0% resdual and Prox OM1 2.50x20 promus DES w/ 20% residual  . CARDIOVERSION    . CARPAL TUNNEL RELEASE Right Nov 2015  . CARPAL TUNNEL RELEASE Right 1992; 05/2014   [name]; [place]  . CESAREAN SECTION  1982; 1984  . FOOT NEUROMA SURGERY Bilateral 2000  . KNEE ARTHROSCOPY Right ~ 2003   "meniscus repair"  . SHOULDER OPEN ROTATOR CUFF REPAIR Right 1996; 1998   "w/fracture repair"  . THYROID SURGERY  2000   "removed lots of nodules"  . TONSILLECTOMY  1976    Social History  Socioeconomic History  . Marital status: Single    Spouse name: Not on file  . Number of children: 2  . Years of education: Masters  . Highest education level: Not on file  Occupational History  . Occupation: Landscape architect  Social Needs  . Financial resource strain: Somewhat hard  . Food insecurity:    Worry: Sometimes true    Inability: Sometimes true  . Transportation needs:    Medical: No    Non-medical: No  Tobacco Use  . Smoking status: Former Smoker    Packs/day: 0.00    Years: 41.00    Pack years: 0.00    Types: Cigarettes    Last attempt to quit: 03/05/2015    Years since quitting: 2.7  . Smokeless tobacco: Never Used  . Tobacco  comment: 04/29/2015 "quit smoking cigarettes 02/27/2015"  Substance and Sexual Activity  . Alcohol use: No  . Drug use: No  . Sexual activity: Not Currently    Birth control/protection: Post-menopausal, Surgical  Lifestyle  . Physical activity:    Days per week: 0 days    Minutes per session: 0 min  . Stress: Very much  Relationships  . Social connections:    Talks on phone: More than three times a week    Gets together: More than three times a week    Attends religious service: More than 4 times per year    Active member of club or organization: No    Attends meetings of clubs or organizations: Never    Relationship status: Never married  . Intimate partner violence:    Fear of current or ex partner: No    Emotionally abused: No    Physically abused: No    Forced sexual activity: No  Other Topics Concern  . 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      Lives with sister in [place].      Tobacco: 1 ppd x 41 years - quit 2016      Alcohol: none      Drugs: none      Exercise:  Walking in neighborhood; physical job.   Right-handed.   2 cups caffeine daily.    Current Outpatient Medications on File Prior to Visit  Medication Sig Dispense Refill  . albuterol (PROVENTIL HFA) 108 (90 Base) MCG/ACT inhaler Inhale 1-2 puffs into the lungs every 6 (six) hours as needed for wheezing or shortness of breath. 1 Inhaler 0  . albuterol (PROVENTIL) (2.5 MG/3ML) 0.083% nebulizer solution Take 3 mLs (2.5 mg total) by nebulization every 6 (six) hours as needed for wheezing or shortness of breath. 150 mL 0  . Alcohol Swabs (B-D SINGLE USE SWABS REGULAR) PADS 1 each by Does not apply route 2 (two) times daily. 100 each 2  . amLODipine (NORVASC) 10 MG tablet Take 1  tablet (10 mg total) by mouth daily. 90 tablet 3  . apixaban (ELIQUIS) 5 MG TABS tablet Take 1 tablet (5 mg total) by mouth 2 (two) times daily. 180 tablet 3  . atorvastatin (LIPITOR) 20 MG tablet Take 1 tablet (20 mg total) by mouth daily. 90 tablet 3  . Blood Glucose Calibration (TRUE METRIX LEVEL 1) Low SOLN 1 each by In Vitro route as  needed. 1 each 0  . Blood Glucose Monitoring Suppl (TRUE METRIX AIR GLUCOSE METER) w/Device KIT 1 each by Does not apply route daily. 1 kit 0  . buPROPion (WELLBUTRIN) 75 MG tablet Take 1 tablet (75 mg total) by mouth every 6 (six) hours. 180 tablet 3  . fenofibrate (TRICOR) 145 MG tablet Take 1 tablet (145 mg total) by mouth daily. 90 tablet 3  . FLUoxetine (PROZAC) 20 MG capsule Take 1 capsule (20 mg total) by mouth at bedtime. 90 capsule 2  . gabapentin (NEURONTIN) 300 MG capsule Take 3 capsules (900 mg total) by mouth 2 (two) times daily. TAKE 3 CAPSULES BY MOUTH EVERY  MORNING AND 3 CAPSULES AT BEDTIME 540 capsule 0  . glucose blood (TRUE METRIX BLOOD GLUCOSE TEST) test strip Used to check blood sugars 2x daily. 100 each 12  . insulin degludec (TRESIBA FLEXTOUCH) 100 UNIT/ML SOPN FlexTouch Pen Inject 0.2 mLs (20 Units total) into the skin daily at 10 pm. 15 mL 0  . Insulin Pen Needle 32G X 4 MM MISC Use to inject insulin daily 100 each 5  . Insulin Syringe-Needle U-100 (INSULIN SYRINGE .5CC/31GX5/16") 31G X 5/16" 0.5 ML MISC Use to inject insulin 100 each 5  . magnesium oxide (MAG-OX) 400 (241.3 Mg) MG tablet Take 1 tablet (400 mg total) by mouth daily. 30 tablet 6  . metFORMIN (GLUCOPHAGE-XR) 500 MG 24 hr tablet Take 4 tablets (2,000 mg total) by mouth daily with breakfast. (Patient taking differently: Take 1,000 mg by mouth 2 (two) times daily. ) 360 tablet 3  . methimazole (TAPAZOLE) 10 MG tablet Take 1 tablet (10 mg total) by mouth daily. 30 tablet 11  . metoprolol succinate (TOPROL-XL) 25 MG 24 hr tablet Take 1 tablet (25 mg total) by mouth 2 (two) times  daily. 180 tablet 3  . nitroGLYCERIN (NITROSTAT) 0.4 MG SL tablet Place 1 tablet (0.4 mg total) under the tongue every 5 (five) minutes as needed for chest pain. 25 tablet 3  . pantoprazole (PROTONIX) 40 MG tablet Take 1 tablet (40 mg total) by mouth daily. 90 tablet 0  . potassium chloride SA (K-DUR,KLOR-CON) 20 MEQ tablet Take 1 tablet (20 mEq total) by mouth 2 (two) times daily. 15 tablet 0  . silver sulfADIAZINE (SILVADENE) 1 % cream Apply 1 application topically daily. 50 g 1  . sotalol (BETAPACE) 120 MG tablet Take 1 tablet (120 mg total) by mouth every 12 (twelve) hours. 180 tablet 3  . SSD 1 % cream APPLY TO AFFECTED AREA ONCE DAILY 50 g 0  . traMADol (ULTRAM) 50 MG tablet Take 1 tablet (50 mg total) by mouth every 8 (eight) hours as needed. (Patient taking differently: Take 50 mg by mouth every 8 (eight) hours as needed for moderate pain. ) 90 tablet 5  . triamcinolone cream (KENALOG) 0.1 % Apply 1 application topically 2 (two) times daily. 30 g 0  . TRUEPLUS LANCETS 28G MISC 1 each by Does not apply route 2 (two) times daily. 100 each 2   No current facility-administered medications on file prior to visit.     Allergies  Allergen Reactions  . Contrast Media [Iodinated Diagnostic Agents] Hives    Spoke to patient, Iodine allergy is really IV contrast allergy.   . Dilaudid [Hydromorphone Hcl] Other (See Comments)    HEADACHE  . Novolog [Insulin Aspart] Shortness Of Breath    "breathing problems"  . Codeine Nausea And Vomiting    HIGH DOSES-SEVERE VOMITING  . Iodine  Other (See Comments)    MUST HAVE BENADRYL PRIOR TO PROCEDURE AND RIGHT BEFORE TREATMENT TO COUNTERACT REACTION-BLISTERING REACTION DERMATOLOGICAL  . Penicillins Itching, Rash and Other (See Comments)    Has patient had a PCN reaction causing immediate rash, facial/tongue/throat swelling, SOB or lightheadedness with hypotension: Yes Has patient had a PCN reaction causing severe rash involving mucus membranes or skin  necrosis: No Has patient had a PCN reaction that required hospitalization No Has patient had a PCN reaction occurring within the last 10 years: No If all of the above answers are "NO", then may proceed with Cephalosporin use.  CHEST SIZED RASH AND ITCHING   . Propofol Other (See Comments)    "Breathing problems - asthma attack" Can take with benadryl   . Ace Inhibitors Cough  . Demerol [Meperidine] Nausea And Vomiting  . Neosporin [Neomycin-Bacitracin Zn-Polymyx] Itching and Rash    MAKES REACTIONS WORSE WHEN USING AS PROPHYLACTIC  . Percocet [Oxycodone-Acetaminophen] Rash  . Tape Itching and Rash    Family History  Problem Relation Age of Onset  . Cancer Mother 3       bronchial cancer  . Breast cancer Mother   . Lung cancer Mother   . Hypertension Father   . COPD Father   . Heart disease Father 51       CAD with cardiac stenting  . Heart attack Father   . Parkinson's disease Father   . Allergies Sister   . Breast cancer Maternal Grandmother   . Emphysema Maternal Grandfather   . Leukemia Paternal Grandmother   . Emphysema Paternal Grandfather   . Thyroid disease Neg Hx     BP 132/78 (BP Location: Left Arm, Patient Position: Sitting, Cuff Size: Normal)   Pulse 78   Ht _0  (1.626 m)   Wt 180 lb (81.6 kg)   SpO2 99%   BMI 30.90 kg/m    Review of Systems Denies hypoglycemia, except once in a clinical situation.      Objective:   Physical Exam VITAL SIGNS:  See vs page GENERAL: no distress.  In wheelchair Pulses: foot pulses are intact bilaterally.   MSK: no deformity of the feet, except for right transmetatarsal amputation.  CV: no edema of the legs Skin:  no ulcer on the feet or ankles.  normal color and temp on the feet and ankles Neuro: sensation is intact to touch on the feet and ankles, but decreased from normal Ext: There is onychomycosis of the toenails, worse on the left great toe.    Lab Results  Component Value Date   HGBA1C 8.7 (A)  12/05/2017       Assessment & Plan:  Insulin-requiring type 2 DM, with CAD: worse  Patient Instructions  check your blood sugar twice a day.  vary the time of day when you check, between before the 3 meals, and at bedtime.  also check if you have symptoms of your blood sugar being too high or too low.  please keep a record of the readings and bring it to your next appointment here (or you can bring the meter itself).  You can write it on any piece of paper.  please call us sooner if your blood sugar goes below 70, or if you have a lot of readings over 200.  Please increase the humalog to three times daily (just before meals) 17-22-12 units. Please continue the same tresiba. Please come back for a follow-up appointment in 2 months.

## 2017-12-05 NOTE — Patient Instructions (Addendum)
check your blood sugar twice a day.  vary the time of day when you check, between before the 3 meals, and at bedtime.  also check if you have symptoms of your blood sugar being too high or too low.  please keep a record of the readings and bring it to your next appointment here (or you can bring the meter itself).  You can write it on any piece of paper.  please call us sooner if your blood sugar goes below 70, or if you have a lot of readings over 200.  Please increase the humalog to three times daily (just before meals) 17-22-12 units. Please continue the same tresiba. Please come back for a follow-up appointment in 2 months.

## 2017-12-08 ENCOUNTER — Ambulatory Visit: Payer: Medicare PPO | Admitting: Podiatry

## 2017-12-12 ENCOUNTER — Encounter: Payer: Self-pay | Admitting: Internal Medicine

## 2017-12-12 ENCOUNTER — Ambulatory Visit: Payer: Medicare PPO | Admitting: Internal Medicine

## 2017-12-12 VITALS — BP 150/88 | HR 58 | Ht 64.0 in | Wt 175.2 lb

## 2017-12-12 DIAGNOSIS — I10 Essential (primary) hypertension: Secondary | ICD-10-CM | POA: Diagnosis not present

## 2017-12-12 DIAGNOSIS — I480 Paroxysmal atrial fibrillation: Secondary | ICD-10-CM

## 2017-12-12 DIAGNOSIS — I4819 Other persistent atrial fibrillation: Secondary | ICD-10-CM

## 2017-12-12 DIAGNOSIS — I481 Persistent atrial fibrillation: Secondary | ICD-10-CM | POA: Diagnosis not present

## 2017-12-12 DIAGNOSIS — G4733 Obstructive sleep apnea (adult) (pediatric): Secondary | ICD-10-CM | POA: Diagnosis not present

## 2017-12-12 NOTE — Progress Notes (Signed)
Electrophysiology Office Note   Date:  12/12/2017   ID:  Elna, Radovich 02-Oct-1950, MRN [id number]  PCP:  [name], PA-C  Cardiologist:  Dr Radford Pax Primary Electrophysiologist: Thompson Grayer, MD    CC: afib   History of Present Illness: Judith [name] is a 67 y.o. female who presents today for electrophysiology evaluation.   She is referred by Dr Radford Pax and Roderic Palau NP for EP consultation regarding afib.  She has a h/o HTN, CAD, DM, and hyperthyroidism.  She has afib for about 3 years, for which she has had multiple prior cardioversions.  She has failed medical therapy with sotalol.  She has severe sleep apnea for which she is on BiPAP.  LA size is 46 mm.  She is on eliquis for stroke prevention. Since starting bipap, she is pleased that she has had no further afib.  She is optimistic that her afib may be improved.  Today, she denies symptoms of palpitations, chest pain, shortness of breath, orthopnea, PND, lower extremity edema, claudication, dizziness, presyncope, syncope, bleeding, or neurologic sequela. The patient is tolerating medications without difficulties and is otherwise without complaint today.    Past Medical History:  Diagnosis Date  . Abnormal EKG 07/31/2013  . Arthritis    "hands" (03/06/2015)  . Asthma   . Carpal tunnel syndrome, bilateral   . Chronic kidney disease   . Complication of anesthesia    slow to wake up  . Coronary artery disease     2 v CAD with CTO of the RCA and high grade bifurcational LCx/OM stenosis. S/P PCI DES x 2 to the LCx/OM.  Marland Kitchen Diabetic peripheral neuropathy ([place]) "since 1996"  . GERD (gastroesophageal reflux disease)   . Goiter   . Headache    migraines prior to menopause  . History of shingles 06/01/2013  . Hyperlipidemia LDL goal <70 10/13/2015  . Hypertension   . Hyperthyroidism   . PAF (paroxysmal atrial fibrillation) ([place]) 04/29/2015   CHADS2VASC score of 4 now on Apixaban  . Pneumonia ~ 1976  . Tremors of nervous  system   . Type II diabetes mellitus (HCC)    insulin dependent   Past Surgical History:  Procedure Laterality Date  . ABDOMINAL HYSTERECTOMY  1988   age 71; CERVICAL DYSPLASIA; ovaries intact.   . AMPUTATION Right 01/23/2016   Procedure: Right 3rd Ray Amputation;  Surgeon: [name], MD;  Location: [facility];  Service: Orthopedics;  Laterality: Right;  . AMPUTATION Right 02/13/2016   Procedure: Right Transmetatarsal Amputation;  Surgeon: [name], MD;  Location: [facility];  Service: Orthopedics;  Laterality: Right;  . CARDIAC CATHETERIZATION N/A 02/27/2015   Procedure: Left Heart Cath and Coronary Angiography;  Surgeon: [name], MD; LAD 40%, mCFX 80%, OM 70%, RCA 100% calcified       . CARDIAC CATHETERIZATION N/A 03/06/2015   Procedure: Coronary Stent Intervention;  Surgeon: [name], MD;  Location: [facility];  Service: Cardiovascular;  Laterality: N/A;  Mid CX 3.50x12 promus DES w/ 0% resdual and Prox OM1 2.50x20 promus DES w/ 20% residual  . CARDIOVERSION    . CARPAL TUNNEL RELEASE Right Nov 2015  . CARPAL TUNNEL RELEASE Right 1992; 05/2014   [name]; [place]  . CESAREAN SECTION  1982; 1984  . FOOT NEUROMA SURGERY Bilateral 2000  . KNEE ARTHROSCOPY Right ~ 2003   "meniscus repair"  . SHOULDER OPEN ROTATOR CUFF REPAIR Right 1996; 1998   "w/fracture repair"  . THYROID  SURGERY  2000   "removed lots of nodules"  . TONSILLECTOMY  1976     Current Outpatient Medications  Medication Sig Dispense Refill  . albuterol (PROVENTIL HFA) 108 (90 Base) MCG/ACT inhaler Inhale 1-2 puffs into the lungs every 6 (six) hours as needed for wheezing or shortness of breath. 1 Inhaler 0  . albuterol (PROVENTIL) (2.5 MG/3ML) 0.083% nebulizer solution Take 3 mLs (2.5 mg total) by nebulization every 6 (six) hours as needed for wheezing or shortness of breath. 150 mL 0  . Alcohol Swabs (B-D SINGLE USE SWABS REGULAR) PADS 1 each by Does not apply route 2 (two) times daily. 100 each 2    . amLODipine (NORVASC) 10 MG tablet Take 1 tablet (10 mg total) by mouth daily. 90 tablet 3  . apixaban (ELIQUIS) 5 MG TABS tablet Take 1 tablet (5 mg total) by mouth 2 (two) times daily. 180 tablet 3  . atorvastatin (LIPITOR) 20 MG tablet Take 1 tablet (20 mg total) by mouth daily. 90 tablet 3  . Blood Glucose Calibration (TRUE METRIX LEVEL 1) Low SOLN 1 each by In Vitro route as needed. 1 each 0  . Blood Glucose Monitoring Suppl (TRUE METRIX AIR GLUCOSE METER) w/Device KIT 1 each by Does not apply route daily. 1 kit 0  . buPROPion (WELLBUTRIN) 75 MG tablet Take 1 tablet (75 mg total) by mouth every 6 (six) hours. 180 tablet 3  . fenofibrate (TRICOR) 145 MG tablet Take 1 tablet (145 mg total) by mouth daily. 90 tablet 3  . FLUoxetine (PROZAC) 20 MG capsule Take 1 capsule (20 mg total) by mouth at bedtime. 90 capsule 2  . gabapentin (NEURONTIN) 300 MG capsule Take 3 capsules (900 mg total) by mouth 2 (two) times daily. TAKE 3 CAPSULES BY MOUTH EVERY  MORNING AND 3 CAPSULES AT BEDTIME 540 capsule 0  . glucose blood (TRUE METRIX BLOOD GLUCOSE TEST) test strip Used to check blood sugars 2x daily. 100 each 12  . insulin degludec (TRESIBA FLEXTOUCH) 100 UNIT/ML SOPN FlexTouch Pen Inject 0.2 mLs (20 Units total) into the skin daily at 10 pm. 15 mL 0  . insulin lispro (HUMALOG KWIKPEN) 100 UNIT/ML KiwkPen three times daily (just before meals) 17-22-12 units. 15 mL 11  . Insulin Pen Needle 32G X 4 MM MISC Use to inject insulin daily 100 each 5  . Insulin Syringe-Needle U-100 (INSULIN SYRINGE .5CC/31GX5/16") 31G X 5/16" 0.5 ML MISC Use to inject insulin 100 each 5  . magnesium oxide (MAG-OX) 400 (241.3 Mg) MG tablet Take 1 tablet (400 mg total) by mouth daily. 30 tablet 6  . metFORMIN (GLUCOPHAGE-XR) 500 MG 24 hr tablet Take 4 tablets (2,000 mg total) by mouth daily with breakfast. (Patient taking differently: Take 1,000 mg by mouth 2 (two) times daily. ) 360 tablet 3  . methimazole (TAPAZOLE) 10 MG tablet  Take 1 tablet (10 mg total) by mouth daily. 30 tablet 11  . metoprolol succinate (TOPROL-XL) 25 MG 24 hr tablet Take 1 tablet (25 mg total) by mouth 2 (two) times daily. 180 tablet 3  . nitroGLYCERIN (NITROSTAT) 0.4 MG SL tablet Place 1 tablet (0.4 mg total) under the tongue every 5 (five) minutes as needed for chest pain. 25 tablet 3  . pantoprazole (PROTONIX) 40 MG tablet Take 1 tablet (40 mg total) by mouth daily. 90 tablet 0  . potassium chloride SA (K-DUR,KLOR-CON) 20 MEQ tablet Take 1 tablet (20 mEq total) by mouth 2 (two) times daily. 15 tablet 0  .  silver sulfADIAZINE (SILVADENE) 1 % cream Apply 1 application topically daily. 50 g 1  . sotalol (BETAPACE) 120 MG tablet Take 1 tablet (120 mg total) by mouth every 12 (twelve) hours. 180 tablet 3  . SSD 1 % cream APPLY TO AFFECTED AREA ONCE DAILY 50 g 0  . traMADol (ULTRAM) 50 MG tablet Take 1 tablet (50 mg total) by mouth every 8 (eight) hours as needed. (Patient taking differently: Take 50 mg by mouth every 8 (eight) hours as needed for moderate pain. ) 90 tablet 5  . triamcinolone cream (KENALOG) 0.1 % Apply 1 application topically 2 (two) times daily. 30 g 0  . TRUEPLUS LANCETS 28G MISC 1 each by Does not apply route 2 (two) times daily. 100 each 2   No current facility-administered medications for this visit.     Allergies:   Contrast media [iodinated diagnostic agents]; Dilaudid [hydromorphone hcl]; Novolog [insulin aspart]; Codeine; Iodine; Penicillins; Propofol; Ace inhibitors; Demerol [meperidine]; Neosporin [neomycin-bacitracin zn-polymyx]; Percocet [oxycodone-acetaminophen]; and Tape   Social History:  The patient  reports that she quit smoking about 2 years ago. Her smoking use included cigarettes. She smoked 0.00 packs per day for 41.00 years. She has never used smokeless tobacco. She reports that she does not drink alcohol or use drugs.   Family History:  The patient's  family history includes Allergies in her sister; Breast  cancer in her maternal grandmother and mother; COPD in her father; Cancer (age of onset: 25) in her mother; Emphysema in her maternal grandfather and paternal grandfather; Heart attack in her father; Heart disease (age of onset: 69) in her father; Hypertension in her father; Leukemia in her paternal grandmother; Lung cancer in her mother; Parkinson's disease in her father.    ROS:  Please see the history of present illness.   All other systems are personally reviewed and negative.    PHYSICAL EXAM: VS:  BP (!) 150/88   Pulse (!) 58   Ht '5\' 4"'  (1.626 m)   Wt 175 lb 3.2 oz (79.5 kg)   SpO2 98%   BMI 30.07 kg/m  , BMI Body mass index is 30.07 kg/m. GEN: Well nourished, well developed, in no acute distress  HEENT: normal  Neck: no JVD, carotid bruits, or masses Cardiac: RRR; no murmurs, rubs, or gallops,no edema  Respiratory:  clear to auscultation bilaterally, normal work of breathing GI: soft, nontender, nondistended, + BS MS: no deformity or atrophy  Skin: warm and dry  Neuro:  Strength and sensation are intact Psych: euthymic mood, full affect  EKG:  EKG is ordered today. The ekg ordered today is personally reviewed and shows sinus rhythm 58 bpm, PR 214 msec, QRS 82 msec, Qtc 443 msec   Recent Labs: 08/06/2017: TSH 0.977 09/19/2017: ALT 10 11/04/2017: BUN 17; Creatinine, Ser 1.35; Hemoglobin 11.5; Magnesium 1.3; Platelets 535; Potassium 4.6; Sodium 136  personally reviewed   Lipid Panel     Component Value Date/Time   CHOL 236 (H) 07/22/2017 0857   TRIG 544 (H) 07/22/2017 0857   HDL 30 (L) 07/22/2017 0857   CHOLHDL 7.9 (H) 07/22/2017 0857   CHOLHDL 5.8 (H) 02/05/2016 1047   VLDL 76 (H) 02/05/2016 1047   [place] Comment 07/22/2017 0857   LDLDIRECT 97 01/06/2017 0904   personally reviewed   Wt Readings from Last 3 Encounters:  12/12/17 175 lb 3.2 oz (79.5 kg)  12/05/17 180 lb (81.6 kg)  11/02/17 176 lb (79.8 kg)      Other studies personally  reviewed: Additional  studies/ records that were reviewed today include: AF clinic notes, prior echo  Review of the above records today demonstrates: as above   ASSESSMENT AND PLAN:  1.  Persistent afib The patient has symptomatic, recurrent probably persistent atrial fibrillation as she has had multiple prior cardioversions.  She did spontaneously terminate her most recent afib however she has failed medical therapy with sotalol. Chads2vasc score is 5.  she is anticoagulated with eliquis.   Therapeutic strategies for afib including medicine and ablation were discussed in detail with the patient today. At this time, she feels that she is doing "much better" since starting BiPAP.  She is not interested in ablation or medicine change (tikosyn) at this time.  She may reconsider if her afib becomes more worrisome.  No changes are made today.   2. Severe OSA Compliance with treatment is advised  3. HTN Stable No change required today  4. CAD No ischemic symptoms No changes  Follow-up in AF clinic in 3 months I will see as needed.  Current medicines are reviewed at length with the patient today.   The patient does not have concerns regarding her medicines.  The following changes were made today:  none   Signed, Thompson Grayer, MD  12/12/2017 9:27 AM     [facility] [address] [place] [address] [phone] (office) [phone] (fax)

## 2017-12-12 NOTE — Patient Instructions (Addendum)
Medication Instructions:  Your physician recommends that you continue on your current medications as directed. Please refer to the Current Medication list given to you today.  Labwork: None ordered.  Testing/Procedures: None ordered.  Follow-Up: Your physician wants you to follow-up in: 3 months with Roderic Palau at the [place] [facility].      Any Other Special Instructions Will Be Listed Below (If Applicable).  If you need a refill on your cardiac medications before your next appointment, please call your pharmacy.

## 2017-12-15 DIAGNOSIS — G4733 Obstructive sleep apnea (adult) (pediatric): Secondary | ICD-10-CM | POA: Diagnosis not present

## 2017-12-15 DIAGNOSIS — H2511 Age-related nuclear cataract, right eye: Secondary | ICD-10-CM | POA: Diagnosis not present

## 2017-12-15 DIAGNOSIS — H25811 Combined forms of age-related cataract, right eye: Secondary | ICD-10-CM | POA: Diagnosis not present

## 2017-12-31 ENCOUNTER — Ambulatory Visit: Payer: Medicare PPO | Admitting: Osteopathic Medicine

## 2017-12-31 ENCOUNTER — Encounter: Payer: Self-pay | Admitting: Osteopathic Medicine

## 2017-12-31 ENCOUNTER — Ambulatory Visit (INDEPENDENT_AMBULATORY_CARE_PROVIDER_SITE_OTHER): Payer: Medicare PPO

## 2017-12-31 ENCOUNTER — Other Ambulatory Visit: Payer: Self-pay

## 2017-12-31 VITALS — BP 142/82 | HR 78 | Temp 98.0°F | Resp 16 | Ht 64.0 in | Wt 175.0 lb

## 2017-12-31 DIAGNOSIS — L03818 Cellulitis of other sites: Secondary | ICD-10-CM

## 2017-12-31 DIAGNOSIS — M7989 Other specified soft tissue disorders: Secondary | ICD-10-CM | POA: Diagnosis not present

## 2017-12-31 DIAGNOSIS — E08621 Diabetes mellitus due to underlying condition with foot ulcer: Secondary | ICD-10-CM

## 2017-12-31 DIAGNOSIS — L97411 Non-pressure chronic ulcer of right heel and midfoot limited to breakdown of skin: Secondary | ICD-10-CM | POA: Diagnosis not present

## 2017-12-31 MED ORDER — CEPHALEXIN 500 MG PO CAPS: 1000.0000 mg | ORAL_CAPSULE | Freq: Two times a day (BID) | ORAL | 0 refills | Status: DC

## 2017-12-31 MED ORDER — DOXYCYCLINE HYCLATE 100 MG PO TABS: 100.0000 mg | ORAL_TABLET | Freq: Two times a day (BID) | ORAL | 0 refills | Status: DC

## 2017-12-31 NOTE — Progress Notes (Signed)
HPI: Judith [name] is a 67 y.o. female who  has a past medical history of Abnormal EKG (07/31/2013), Arthritis, Asthma, Carpal tunnel syndrome, bilateral, Chronic kidney disease, Complication of anesthesia, Coronary artery disease, Diabetic peripheral neuropathy ([place]) ("since 1996"), GERD (gastroesophageal reflux disease), Goiter, Headache, History of shingles (06/01/2013), Hyperlipidemia LDL goal <70 (10/13/2015), Hypertension, Hyperthyroidism, PAF (paroxysmal atrial fibrillation) ([place]) (04/29/2015), Pneumonia (~ 1976), Tremors of nervous system, and Type II diabetes mellitus ([place]).  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  . Location/Quality: L dorsal foot redness, not really painful, there is some swelling. Pt not aware of wound on plantar surface of foot.  . Duration: 4-5 days   . Podiatry notes 11/17/17: "On the left hallux distal portion there is a superficial granular wound present this is an abrasion type area where she peeled a piece of skin off.  There is no surrounding erythema, ascending cellulitis.  There is no fluctuation or crepitation or any malodor.  The nails are hypertrophic, dystrophic, discolored and elongated x5.  Subjectively there is tenderness to palpation 1-5 on the left foot.  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 albuterol (PROVENTIL HFA) 108 (90 Base) MCG/ACT inhaler Inhale 1-2 puffs into the lungs every 6 (six) hours as needed for wheezing or shortness of breath. 1 Inhaler 0  . albuterol (PROVENTIL) (2.5 MG/3ML) 0.083% nebulizer solution Take  3 mLs (2.5 mg total) by nebulization every 6 (six) hours as needed for wheezing or shortness of breath. 150 mL 0  . Alcohol Swabs (B-D SINGLE USE SWABS REGULAR) PADS 1 each by Does not apply route 2 (two) times daily. 100 each 2  . amLODipine (NORVASC) 10 MG tablet Take 1 tablet (10 mg total) by mouth daily. 90 tablet 3  . apixaban (ELIQUIS) 5 MG TABS tablet Take 1 tablet (5 mg total) by mouth 2 (two) times daily. 180 tablet 3  . atorvastatin (LIPITOR) 20 MG tablet Take 1 tablet (20 mg total) by mouth daily. 90 tablet 3  . Blood Glucose Calibration (TRUE METRIX LEVEL 1) Low SOLN 1 each by In Vitro route as needed. 1 each 0  . Blood Glucose Monitoring Suppl (TRUE METRIX AIR GLUCOSE METER) w/Device KIT 1 each by Does not apply route daily. 1 kit 0  . buPROPion (WELLBUTRIN) 75 MG tablet Take 1 tablet (75 mg total) by mouth every 6 (six) hours. 180 tablet 3  . fenofibrate (TRICOR) 145 MG tablet Take 1 tablet (145 mg total) by mouth daily. 90 tablet 3  . FLUoxetine (PROZAC) 20 MG capsule Take 1 capsule (20 mg total) by mouth at bedtime. 90 capsule 2  . gabapentin (NEURONTIN) 300 MG capsule Take 3 capsules (900 mg total) by mouth 2 (two) times daily. TAKE 3 CAPSULES BY MOUTH EVERY  MORNING AND 3 CAPSULES AT BEDTIME 540 capsule 0  . glucose blood (TRUE METRIX BLOOD GLUCOSE TEST) test strip Used to check blood sugars 2x daily. 100 each 12  . insulin degludec (TRESIBA FLEXTOUCH) 100 UNIT/ML SOPN FlexTouch Pen Inject 0.2 mLs (20 Units total) into the skin daily  at 10 pm. 15 mL 0  . insulin lispro (HUMALOG KWIKPEN) 100 UNIT/ML KiwkPen three times daily (just before meals) 17-22-12 units. 15 mL 11  . Insulin Pen Needle 32G X 4 MM MISC Use to inject insulin daily 100 each 5  . Insulin Syringe-Needle U-100 (INSULIN SYRINGE .5CC/31GX5/16") 31G X 5/16" 0.5 ML MISC Use to inject insulin 100 each 5  . magnesium oxide (MAG-OX) 400 (241.3 Mg) MG tablet Take 1 tablet (400 mg total) by mouth daily. 30 tablet 6  .  metFORMIN (GLUCOPHAGE-XR) 500 MG 24 hr tablet Take 4 tablets (2,000 mg total) by mouth daily with breakfast. (Patient taking differently: Take 1,000 mg by mouth 2 (two) times daily. ) 360 tablet 3  . methimazole (TAPAZOLE) 10 MG tablet Take 1 tablet (10 mg total) by mouth daily. 30 tablet 11  . metoprolol succinate (TOPROL-XL) 25 MG 24 hr tablet Take 1 tablet (25 mg total) by mouth 2 (two) times daily. 180 tablet 3  . nitroGLYCERIN (NITROSTAT) 0.4 MG SL tablet Place 1 tablet (0.4 mg total) under the tongue every 5 (five) minutes as needed for chest pain. 25 tablet 3  . pantoprazole (PROTONIX) 40 MG tablet Take 1 tablet (40 mg total) by mouth daily. 90 tablet 0  . potassium chloride SA (K-DUR,KLOR-CON) 20 MEQ tablet Take 1 tablet (20 mEq total) by mouth 2 (two) times daily. 15 tablet 0  . silver sulfADIAZINE (SILVADENE) 1 % cream Apply 1 application topically daily. 50 g 1  . sotalol (BETAPACE) 120 MG tablet Take 1 tablet (120 mg total) by mouth every 12 (twelve) hours. 180 tablet 3  . SSD 1 % cream APPLY TO AFFECTED AREA ONCE DAILY 50 g 0  . traMADol (ULTRAM) 50 MG tablet Take 1 tablet (50 mg total) by mouth every 8 (eight) hours as needed. (Patient taking differently: Take 50 mg by mouth every 8 (eight) hours as needed for moderate pain. ) 90 tablet 5  . triamcinolone cream (KENALOG) 0.1 % Apply 1 application topically 2 (two) times daily. 30 g 0  . TRUEPLUS LANCETS 28G MISC 1 each by Does not apply route 2 (two) times daily. 100 each 2   No current facility-administered medications for this visit.     Allergies  Allergen Reactions  . Contrast Media [Iodinated Diagnostic Agents] Hives    Spoke to patient, Iodine allergy is really IV contrast allergy.   . Dilaudid [Hydromorphone Hcl] Other (See Comments)    HEADACHE  . Novolog [Insulin Aspart] Shortness Of Breath    "breathing problems"  . Codeine Nausea And Vomiting    HIGH DOSES-SEVERE VOMITING  . Iodine Other (See Comments)    MUST HAVE  BENADRYL PRIOR TO PROCEDURE AND RIGHT BEFORE TREATMENT TO COUNTERACT REACTION-BLISTERING REACTION DERMATOLOGICAL  . Penicillins Itching, Rash and Other (See Comments)    Has patient had a PCN reaction causing immediate rash, facial/tongue/throat swelling, SOB or lightheadedness with hypotension: Yes Has patient had a PCN reaction causing severe rash involving mucus membranes or skin necrosis: No Has patient had a PCN reaction that required hospitalization No Has patient had a PCN reaction occurring within the last 10 years: No If all of the above answers are "NO", then may proceed with Cephalosporin use.  CHEST SIZED RASH AND ITCHING   . Propofol Other (See Comments)    "Breathing problems - asthma attack" Can take with benadryl   . Ace Inhibitors Cough  . Demerol [Meperidine] Nausea And Vomiting  . Neosporin [Neomycin-Bacitracin Zn-Polymyx] Itching  and Rash    MAKES REACTIONS WORSE WHEN USING AS PROPHYLACTIC  . Percocet [Oxycodone-Acetaminophen] Rash  . Tape Itching and Rash      Review of Systems:  Constitutional:  No  fever, no chills  Cardiac: No  chest pain, No  pressure  Respiratory:  No  shortness of breath. No  Cough  Gastrointestinal: No  abdominal pain, No  nausea   Musculoskeletal: No new myalgia/arthralgia  Skin: see HPI  Endocrine: No cold intolerance,  No heat intolerance. No polyuria/polydipsia/polyphagia   Neurologic: No  weakness, No  dizziness   Exam:  BP (!) 142/82   Pulse 78   Temp 98 F (36.7 C) (Oral)   Resp 16   Ht '5\' 4"'  (1.626 m)   Wt 175 lb (79.4 kg)   SpO2 98%   BMI 30.04 kg/m   Constitutional: VS see above. General Appearance: alert, well-developed, well-nourished, NAD  Eyes: Normal lids and conjunctive, non-icteric sclera  Ears, Nose, Mouth, Throat: MMM, Normal external inspection ears/nares/mouth/lips/gums.   Neck: No masses, trachea midline.   Respiratory: Normal respiratory effort.   Cardiovascular: No lower extremity  edema until some edema at L foot surrounding cellulitis, see photos. (+)capillary refill <3 sec at L toes all.   Musculoskeletal: Gait normal.   Skin: see photos.   Psychiatric: Normal judgment/insight. Normal mood and affect. Oriented x3.     Dg Foot Complete Right  Result Date: 12/31/2017 CLINICAL DATA:  foot sweling, eval for subq air, bone destruction, FB. Diabetic EXAM: RIGHT FOOT COMPLETE - 3+ VIEW COMPARISON:  None. FINDINGS: Osseous alignment is normal. Bone mineralization is normal. No fracture line or displaced fracture fragment. No acute or suspicious osseous lesion. No destructive change to suggest osteomyelitis. No significant degenerative change. No soft tissue gas. No foreign body appreciated within the soft tissues. Incidental note made of chronic osseous spurring at the plantar margin of the posterior calcaneus and linear calcifications at the Achilles tendon insertion site suggesting chronic calcific tendinopathy. IMPRESSION: No osseous abnormality.  No soft tissue gas.  No foreign body seen. Electronically Signed   By: Franki Cabot M.D.   On: 12/31/2017 16:38           ASSESSMENT/PLAN:   Diabetic ulcer of right midfoot associated with diabetes mellitus due to underlying condition, limited to breakdown of skin ([place]) - Plan: MR FOOT LEFT WO CONTRAST, WOUND CULTURE  Cellulitis of other specified site - Plan: DG Foot Complete Right, MR FOOT LEFT WO CONTRAST      Patient Instructions   Need to follow up ASAP with your podiatrist. They wanted to see you 2 weeks after your last visit, please keep all follow up appointments as instructed!  Will start antibiotics. 2 antibiotics given your history of diabetes and my concern for possible deeper infection.   Please see Legrand Como next week to recheck the foot, sooner if it gets worse.  Will get MRI of the foot to evaluate for deeper infection.       If you have lab work done today you will be contacted with your lab  results within the next 2 weeks.  If you have not heard from [place] then please contact us. The fastest way to get your results is to register for My Chart.   IF you received an x-ray today, you will receive an invoice from [facility]. Please contact [facility] at [phone] with questions or concerns regarding your invoice.   IF you received labwork today, you will receive  an Pharmacologist from [facility]. Please contact [facility] at [phone] with questions or concerns regarding your invoice.   Our billing staff will not be able to assist you with questions regarding bills from these companies.  You will be contacted with the lab results as soon as they are available. The fastest way to get your results is to activate your My Chart account. Instructions are located on the last page of this paperwork. If you have not heard from [place] regarding the results in 2 weeks, please contact this office.         Visit summary with medication list and pertinent instructions was printed for patient to review. All questions at time of visit were answered - patient instructed to contact office with any additional concerns. ER/RTC precautions were reviewed with the patient.   Follow-up plan: Return for recheck with Legrand Como next week unless able to be seen by podiatrist .    Please note: voice recognition software was used to produce this document, and typos may escape review. Please contact Dr. Sheppard Coil for any needed clarifications.

## 2017-12-31 NOTE — Patient Instructions (Addendum)
Need to follow up ASAP with your podiatrist. They wanted to see you 2 weeks after your last visit, please keep all follow up appointments as instructed!  Will start antibiotics. 2 antibiotics given your history of diabetes and my concern for possible deeper infection.   Please see Legrand Como next week to recheck the foot, sooner if it gets worse.  Will get MRI of the foot to evaluate for deeper infection.       If you have lab work done today you will be contacted with your lab results within the next 2 weeks.  If you have not heard from [place] then please contact us. The fastest way to get your results is to register for My Chart.   IF you received an x-ray today, you will receive an invoice from [facility]. Please contact [facility] at [phone] with questions or concerns regarding your invoice.   IF you received labwork today, you will receive an invoice from [facility]. Please contact [facility] at [phone] with questions or concerns regarding your invoice.   Our billing staff will not be able to assist you with questions regarding bills from these companies.  You will be contacted with the lab results as soon as they are available. The fastest way to get your results is to activate your My Chart account. Instructions are located on the last page of this paperwork. If you have not heard from [place] regarding the results in 2 weeks, please contact this office.

## 2018-01-02 ENCOUNTER — Emergency Department (HOSPITAL_COMMUNITY): Payer: Medicare PPO

## 2018-01-02 ENCOUNTER — Inpatient Hospital Stay (HOSPITAL_COMMUNITY)
Admission: EM | Admit: 2018-01-02 | Discharge: 2018-01-11 | DRG: 853 | Disposition: A | Discharge status: Skilled Nursing Facility | Payer: Medicare PPO | Attending: Internal Medicine | Admitting: Internal Medicine

## 2018-01-02 ENCOUNTER — Encounter (HOSPITAL_COMMUNITY): Payer: Self-pay | Admitting: *Deleted

## 2018-01-02 ENCOUNTER — Telehealth: Payer: Self-pay | Admitting: *Deleted

## 2018-01-02 ENCOUNTER — Other Ambulatory Visit: Payer: Self-pay

## 2018-01-02 ENCOUNTER — Ambulatory Visit (INDEPENDENT_AMBULATORY_CARE_PROVIDER_SITE_OTHER): Payer: Medicare PPO | Admitting: Podiatry

## 2018-01-02 DIAGNOSIS — Z743 Need for continuous supervision: Secondary | ICD-10-CM | POA: Diagnosis not present

## 2018-01-02 DIAGNOSIS — F419 Anxiety disorder, unspecified: Secondary | ICD-10-CM | POA: Diagnosis present

## 2018-01-02 DIAGNOSIS — IMO0002 Reserved for concepts with insufficient information to code with codable children: Secondary | ICD-10-CM | POA: Diagnosis present

## 2018-01-02 DIAGNOSIS — E1121 Type 2 diabetes mellitus with diabetic nephropathy: Secondary | ICD-10-CM | POA: Diagnosis present

## 2018-01-02 DIAGNOSIS — J189 Pneumonia, unspecified organism: Secondary | ICD-10-CM

## 2018-01-02 DIAGNOSIS — E1165 Type 2 diabetes mellitus with hyperglycemia: Secondary | ICD-10-CM | POA: Diagnosis not present

## 2018-01-02 DIAGNOSIS — R936 Abnormal findings on diagnostic imaging of limbs: Secondary | ICD-10-CM | POA: Diagnosis not present

## 2018-01-02 DIAGNOSIS — Z862 Personal history of diseases of the blood and blood-forming organs and certain disorders involving the immune mechanism: Secondary | ICD-10-CM

## 2018-01-02 DIAGNOSIS — J449 Chronic obstructive pulmonary disease, unspecified: Secondary | ICD-10-CM | POA: Diagnosis not present

## 2018-01-02 DIAGNOSIS — Z9119 Patient's noncompliance with other medical treatment and regimen: Secondary | ICD-10-CM

## 2018-01-02 DIAGNOSIS — E0590 Thyrotoxicosis, unspecified without thyrotoxic crisis or storm: Secondary | ICD-10-CM | POA: Diagnosis not present

## 2018-01-02 DIAGNOSIS — E11621 Type 2 diabetes mellitus with foot ulcer: Secondary | ICD-10-CM | POA: Diagnosis not present

## 2018-01-02 DIAGNOSIS — K219 Gastro-esophageal reflux disease without esophagitis: Secondary | ICD-10-CM | POA: Diagnosis not present

## 2018-01-02 DIAGNOSIS — I340 Nonrheumatic mitral (valve) insufficiency: Secondary | ICD-10-CM | POA: Diagnosis not present

## 2018-01-02 DIAGNOSIS — Z880 Allergy status to penicillin: Secondary | ICD-10-CM

## 2018-01-02 DIAGNOSIS — J9621 Acute and chronic respiratory failure with hypoxia: Secondary | ICD-10-CM | POA: Diagnosis not present

## 2018-01-02 DIAGNOSIS — J9601 Acute respiratory failure with hypoxia: Secondary | ICD-10-CM | POA: Diagnosis not present

## 2018-01-02 DIAGNOSIS — Z885 Allergy status to narcotic agent status: Secondary | ICD-10-CM

## 2018-01-02 DIAGNOSIS — Z794 Long term (current) use of insulin: Secondary | ICD-10-CM

## 2018-01-02 DIAGNOSIS — S91302A Unspecified open wound, left foot, initial encounter: Secondary | ICD-10-CM

## 2018-01-02 DIAGNOSIS — E0500 Thyrotoxicosis with diffuse goiter without thyrotoxic crisis or storm: Secondary | ICD-10-CM | POA: Diagnosis present

## 2018-01-02 DIAGNOSIS — R002 Palpitations: Secondary | ICD-10-CM | POA: Diagnosis present

## 2018-01-02 DIAGNOSIS — N183 Chronic kidney disease, stage 3 (moderate): Secondary | ICD-10-CM | POA: Diagnosis not present

## 2018-01-02 DIAGNOSIS — J9600 Acute respiratory failure, unspecified whether with hypoxia or hypercapnia: Secondary | ICD-10-CM

## 2018-01-02 DIAGNOSIS — L02612 Cutaneous abscess of left foot: Secondary | ICD-10-CM

## 2018-01-02 DIAGNOSIS — L0390 Cellulitis, unspecified: Secondary | ICD-10-CM | POA: Diagnosis present

## 2018-01-02 DIAGNOSIS — I509 Heart failure, unspecified: Secondary | ICD-10-CM

## 2018-01-02 DIAGNOSIS — D649 Anemia, unspecified: Secondary | ICD-10-CM | POA: Diagnosis not present

## 2018-01-02 DIAGNOSIS — R600 Localized edema: Secondary | ICD-10-CM | POA: Diagnosis not present

## 2018-01-02 DIAGNOSIS — R079 Chest pain, unspecified: Secondary | ICD-10-CM

## 2018-01-02 DIAGNOSIS — Z7901 Long term (current) use of anticoagulants: Secondary | ICD-10-CM

## 2018-01-02 DIAGNOSIS — J9622 Acute and chronic respiratory failure with hypercapnia: Secondary | ICD-10-CM | POA: Diagnosis not present

## 2018-01-02 DIAGNOSIS — Z8619 Personal history of other infectious and parasitic diseases: Secondary | ICD-10-CM

## 2018-01-02 DIAGNOSIS — E1122 Type 2 diabetes mellitus with diabetic chronic kidney disease: Secondary | ICD-10-CM | POA: Diagnosis present

## 2018-01-02 DIAGNOSIS — G629 Polyneuropathy, unspecified: Secondary | ICD-10-CM

## 2018-01-02 DIAGNOSIS — L03116 Cellulitis of left lower limb: Secondary | ICD-10-CM | POA: Diagnosis present

## 2018-01-02 DIAGNOSIS — R9389 Abnormal findings on diagnostic imaging of other specified body structures: Secondary | ICD-10-CM | POA: Diagnosis present

## 2018-01-02 DIAGNOSIS — Z87891 Personal history of nicotine dependence: Secondary | ICD-10-CM

## 2018-01-02 DIAGNOSIS — G4733 Obstructive sleep apnea (adult) (pediatric): Secondary | ICD-10-CM | POA: Diagnosis not present

## 2018-01-02 DIAGNOSIS — I5031 Acute diastolic (congestive) heart failure: Secondary | ICD-10-CM | POA: Diagnosis present

## 2018-01-02 DIAGNOSIS — D631 Anemia in chronic kidney disease: Secondary | ICD-10-CM | POA: Diagnosis present

## 2018-01-02 DIAGNOSIS — Z79899 Other long term (current) drug therapy: Secondary | ICD-10-CM

## 2018-01-02 DIAGNOSIS — Z8249 Family history of ischemic heart disease and other diseases of the circulatory system: Secondary | ICD-10-CM

## 2018-01-02 DIAGNOSIS — K5900 Constipation, unspecified: Secondary | ICD-10-CM | POA: Diagnosis not present

## 2018-01-02 DIAGNOSIS — R0602 Shortness of breath: Secondary | ICD-10-CM | POA: Diagnosis not present

## 2018-01-02 DIAGNOSIS — I10 Essential (primary) hypertension: Secondary | ICD-10-CM | POA: Diagnosis not present

## 2018-01-02 DIAGNOSIS — J440 Chronic obstructive pulmonary disease with acute lower respiratory infection: Secondary | ICD-10-CM | POA: Diagnosis present

## 2018-01-02 DIAGNOSIS — I480 Paroxysmal atrial fibrillation: Secondary | ICD-10-CM | POA: Diagnosis present

## 2018-01-02 DIAGNOSIS — Z89431 Acquired absence of right foot: Secondary | ICD-10-CM | POA: Diagnosis not present

## 2018-01-02 DIAGNOSIS — I482 Chronic atrial fibrillation: Secondary | ICD-10-CM | POA: Diagnosis not present

## 2018-01-02 DIAGNOSIS — A419 Sepsis, unspecified organism: Principal | ICD-10-CM | POA: Diagnosis present

## 2018-01-02 DIAGNOSIS — E049 Nontoxic goiter, unspecified: Secondary | ICD-10-CM | POA: Diagnosis present

## 2018-01-02 DIAGNOSIS — I130 Hypertensive heart and chronic kidney disease with heart failure and stage 1 through stage 4 chronic kidney disease, or unspecified chronic kidney disease: Secondary | ICD-10-CM | POA: Diagnosis not present

## 2018-01-02 DIAGNOSIS — L97529 Non-pressure chronic ulcer of other part of left foot with unspecified severity: Secondary | ICD-10-CM | POA: Diagnosis not present

## 2018-01-02 DIAGNOSIS — E785 Hyperlipidemia, unspecified: Secondary | ICD-10-CM | POA: Diagnosis present

## 2018-01-02 DIAGNOSIS — E1149 Type 2 diabetes mellitus with other diabetic neurological complication: Secondary | ICD-10-CM

## 2018-01-02 DIAGNOSIS — Z888 Allergy status to other drugs, medicaments and biological substances status: Secondary | ICD-10-CM

## 2018-01-02 DIAGNOSIS — R279 Unspecified lack of coordination: Secondary | ICD-10-CM | POA: Diagnosis not present

## 2018-01-02 DIAGNOSIS — E1142 Type 2 diabetes mellitus with diabetic polyneuropathy: Secondary | ICD-10-CM | POA: Diagnosis not present

## 2018-01-02 DIAGNOSIS — Z91048 Other nonmedicinal substance allergy status: Secondary | ICD-10-CM

## 2018-01-02 DIAGNOSIS — F329 Major depressive disorder, single episode, unspecified: Secondary | ICD-10-CM | POA: Diagnosis present

## 2018-01-02 DIAGNOSIS — I25118 Atherosclerotic heart disease of native coronary artery with other forms of angina pectoris: Secondary | ICD-10-CM | POA: Diagnosis not present

## 2018-01-02 DIAGNOSIS — I2510 Atherosclerotic heart disease of native coronary artery without angina pectoris: Secondary | ICD-10-CM | POA: Diagnosis present

## 2018-01-02 DIAGNOSIS — Z91041 Radiographic dye allergy status: Secondary | ICD-10-CM

## 2018-01-02 LAB — CBC WITH DIFFERENTIAL/PLATELET
BASOS ABS: 0 10*3/uL (ref 0.0–0.1)
BASOS PCT: 0 %
EOS ABS: 0 10*3/uL (ref 0.0–0.7)
Eosinophils Relative: 0 %
HCT: 29.8 % — ABNORMAL LOW (ref 36.0–46.0)
Hemoglobin: 9.7 g/dL — ABNORMAL LOW (ref 12.0–15.0)
LYMPHS ABS: 1.2 10*3/uL (ref 0.7–4.0)
Lymphocytes Relative: 7 %
MCH: 27.2 pg (ref 26.0–34.0)
MCHC: 32.6 g/dL (ref 30.0–36.0)
MCV: 83.7 fL (ref 78.0–100.0)
Monocytes Absolute: 1.2 10*3/uL — ABNORMAL HIGH (ref 0.1–1.0)
Monocytes Relative: 7 %
NEUTROS PCT: 86 %
Neutro Abs: 14.5 10*3/uL — ABNORMAL HIGH (ref 1.7–7.7)
PLATELETS: 452 10*3/uL — AB (ref 150–400)
RBC: 3.56 MIL/uL — AB (ref 3.87–5.11)
RDW: 14.5 % (ref 11.5–15.5)
WBC: 16.9 10*3/uL — AB (ref 4.0–10.5)

## 2018-01-02 LAB — GLUCOSE, CAPILLARY
GLUCOSE-CAPILLARY: 160 mg/dL — AB (ref 70–99)
Glucose-Capillary: 222 mg/dL — ABNORMAL HIGH (ref 70–99)

## 2018-01-02 LAB — BASIC METABOLIC PANEL
Anion gap: 14 (ref 5–15)
BUN: 21 mg/dL (ref 8–23)
CALCIUM: 9.3 mg/dL (ref 8.9–10.3)
CHLORIDE: 93 mmol/L — AB (ref 98–111)
CO2: 28 mmol/L (ref 22–32)
CREATININE: 1.03 mg/dL — AB (ref 0.44–1.00)
GFR, EST NON AFRICAN AMERICAN: 56 mL/min — AB (ref >60)
Glucose, Bld: 204 mg/dL — ABNORMAL HIGH (ref 70–99)
Potassium: 3.5 mmol/L (ref 3.5–5.1)
SODIUM: 135 mmol/L (ref 135–145)

## 2018-01-02 LAB — C-REACTIVE PROTEIN: CRP: 23.5 mg/dL — AB (ref <1.0)

## 2018-01-02 LAB — I-STAT CG4 LACTIC ACID, ED: LACTIC ACID, VENOUS: 1.99 mmol/L — AB (ref 0.5–1.9)

## 2018-01-02 LAB — SEDIMENTATION RATE: SED RATE: 133 mm/h — AB (ref 0–22)

## 2018-01-02 MED ORDER — SOTALOL HCL 120 MG PO TABS
120.0000 mg | ORAL_TABLET | Freq: Two times a day (BID) | ORAL | Status: DC
  Administered 2018-01-02 – 2018-01-11 (×15): 120 mg via ORAL
  Filled 2018-01-02 (×18): qty 1

## 2018-01-02 MED ORDER — TRAMADOL HCL 50 MG PO TABS
50.0000 mg | ORAL_TABLET | Freq: Four times a day (QID) | ORAL | Status: DC | PRN
  Administered 2018-01-02 – 2018-01-11 (×8): 50 mg via ORAL
  Filled 2018-01-02 (×8): qty 1

## 2018-01-02 MED ORDER — POTASSIUM CHLORIDE CRYS ER 20 MEQ PO TBCR
20.0000 meq | EXTENDED_RELEASE_TABLET | Freq: Two times a day (BID) | ORAL | Status: DC
  Administered 2018-01-02 – 2018-01-10 (×15): 20 meq via ORAL
  Filled 2018-01-02 (×15): qty 1

## 2018-01-02 MED ORDER — SODIUM CHLORIDE 0.9 % IV BOLUS
500.0000 mL | Freq: Once | INTRAVENOUS | Status: AC
  Administered 2018-01-02: 500 mL via INTRAVENOUS

## 2018-01-02 MED ORDER — PIPERACILLIN-TAZOBACTAM 3.375 G IVPB 30 MIN
3.3750 g | Freq: Once | INTRAVENOUS | Status: AC
  Administered 2018-01-02: 3.375 g via INTRAVENOUS
  Filled 2018-01-02: qty 50

## 2018-01-02 MED ORDER — INSULIN ASPART 100 UNIT/ML ~~LOC~~ SOLN
0.0000 [IU] | Freq: Three times a day (TID) | SUBCUTANEOUS | Status: DC
  Administered 2018-01-02: 2 [IU] via SUBCUTANEOUS
  Administered 2018-01-03 (×2): 3 [IU] via SUBCUTANEOUS
  Administered 2018-01-03: 2 [IU] via SUBCUTANEOUS

## 2018-01-02 MED ORDER — APIXABAN 5 MG PO TABS
5.0000 mg | ORAL_TABLET | Freq: Two times a day (BID) | ORAL | Status: DC
  Administered 2018-01-02 – 2018-01-11 (×16): 5 mg via ORAL
  Filled 2018-01-02 (×16): qty 1

## 2018-01-02 MED ORDER — ACETAMINOPHEN 650 MG RE SUPP: 650.0000 mg | Freq: Four times a day (QID) | RECTAL | Status: DC | PRN

## 2018-01-02 MED ORDER — SODIUM CHLORIDE 0.9% FLUSH: 3.0000 mL | INTRAVENOUS | Status: DC | PRN

## 2018-01-02 MED ORDER — MORPHINE SULFATE (PF) 4 MG/ML IV SOLN
4.0000 mg | Freq: Once | INTRAVENOUS | Status: AC
  Administered 2018-01-02: 4 mg via INTRAVENOUS
  Filled 2018-01-02: qty 1

## 2018-01-02 MED ORDER — MAGNESIUM OXIDE 400 (241.3 MG) MG PO TABS
400.0000 mg | ORAL_TABLET | Freq: Every day | ORAL | Status: DC
  Administered 2018-01-02 – 2018-01-11 (×9): 400 mg via ORAL
  Filled 2018-01-02 (×9): qty 1

## 2018-01-02 MED ORDER — SODIUM CHLORIDE 0.9 % IV SOLN
2.0000 g | Freq: Once | INTRAVENOUS | Status: AC
  Administered 2018-01-02: 2 g via INTRAVENOUS
  Filled 2018-01-02: qty 2

## 2018-01-02 MED ORDER — ALBUTEROL SULFATE (2.5 MG/3ML) 0.083% IN NEBU
2.5000 mg | INHALATION_SOLUTION | Freq: Four times a day (QID) | RESPIRATORY_TRACT | Status: DC | PRN
  Administered 2018-01-02 – 2018-01-06 (×7): 2.5 mg via RESPIRATORY_TRACT
  Filled 2018-01-02 (×9): qty 3

## 2018-01-02 MED ORDER — AMLODIPINE BESYLATE 10 MG PO TABS
10.0000 mg | ORAL_TABLET | Freq: Every day | ORAL | Status: DC
  Administered 2018-01-02 – 2018-01-04 (×3): 10 mg via ORAL
  Filled 2018-01-02 (×2): qty 2
  Filled 2018-01-02: qty 1

## 2018-01-02 MED ORDER — ONDANSETRON HCL 4 MG/2ML IJ SOLN
4.0000 mg | Freq: Four times a day (QID) | INTRAMUSCULAR | Status: DC | PRN
  Administered 2018-01-03: 4 mg via INTRAVENOUS
  Filled 2018-01-02: qty 2

## 2018-01-02 MED ORDER — INSULIN ASPART 100 UNIT/ML ~~LOC~~ SOLN
0.0000 [IU] | Freq: Every day | SUBCUTANEOUS | Status: DC
  Administered 2018-01-02: 2 [IU] via SUBCUTANEOUS

## 2018-01-02 MED ORDER — VANCOMYCIN HCL IN DEXTROSE 750-5 MG/150ML-% IV SOLN: 750.0000 mg | INTRAVENOUS | Status: DC

## 2018-01-02 MED ORDER — FLUOXETINE HCL 20 MG PO CAPS
20.0000 mg | ORAL_CAPSULE | Freq: Every day | ORAL | Status: DC
  Administered 2018-01-02 – 2018-01-10 (×8): 20 mg via ORAL
  Filled 2018-01-02 (×8): qty 1

## 2018-01-02 MED ORDER — MORPHINE SULFATE (PF) 2 MG/ML IV SOLN
2.0000 mg | Freq: Once | INTRAVENOUS | Status: AC | PRN
  Administered 2018-01-03: 2 mg via INTRAVENOUS
  Filled 2018-01-02: qty 1

## 2018-01-02 MED ORDER — SODIUM CHLORIDE 0.9 % IV SOLN: 250.0000 mL | INTRAVENOUS | Status: DC | PRN

## 2018-01-02 MED ORDER — METOPROLOL SUCCINATE ER 25 MG PO TB24
25.0000 mg | ORAL_TABLET | Freq: Two times a day (BID) | ORAL | Status: DC
  Administered 2018-01-02 – 2018-01-04 (×4): 25 mg via ORAL
  Filled 2018-01-02 (×4): qty 1

## 2018-01-02 MED ORDER — PANTOPRAZOLE SODIUM 40 MG PO TBEC
40.0000 mg | DELAYED_RELEASE_TABLET | Freq: Every day | ORAL | Status: DC
  Administered 2018-01-02 – 2018-01-11 (×9): 40 mg via ORAL
  Filled 2018-01-02 (×9): qty 1

## 2018-01-02 MED ORDER — ACETAMINOPHEN 325 MG PO TABS
650.0000 mg | ORAL_TABLET | Freq: Four times a day (QID) | ORAL | Status: DC | PRN
  Administered 2018-01-02 – 2018-01-08 (×8): 650 mg via ORAL
  Filled 2018-01-02 (×8): qty 2

## 2018-01-02 MED ORDER — ENOXAPARIN SODIUM 40 MG/0.4ML ~~LOC~~ SOLN: 40.0000 mg | SUBCUTANEOUS | Status: DC

## 2018-01-02 MED ORDER — ATORVASTATIN CALCIUM 20 MG PO TABS
20.0000 mg | ORAL_TABLET | Freq: Every evening | ORAL | Status: DC
  Administered 2018-01-02 – 2018-01-10 (×9): 20 mg via ORAL
  Filled 2018-01-02 (×2): qty 1
  Filled 2018-01-02: qty 2
  Filled 2018-01-02 (×2): qty 1
  Filled 2018-01-02: qty 2
  Filled 2018-01-02 (×3): qty 1

## 2018-01-02 MED ORDER — MORPHINE SULFATE (PF) 4 MG/ML IV SOLN: 4.0000 mg | Freq: Once | INTRAVENOUS | Status: DC | PRN

## 2018-01-02 MED ORDER — OXYCODONE HCL 5 MG PO TABS: 5.0000 mg | ORAL_TABLET | Freq: Four times a day (QID) | ORAL | Status: DC | PRN

## 2018-01-02 MED ORDER — ONDANSETRON HCL 4 MG/2ML IJ SOLN
4.0000 mg | Freq: Once | INTRAMUSCULAR | Status: AC
  Administered 2018-01-02: 4 mg via INTRAVENOUS
  Filled 2018-01-02: qty 2

## 2018-01-02 MED ORDER — SODIUM CHLORIDE 0.9 % IV SOLN
1.0000 g | Freq: Three times a day (TID) | INTRAVENOUS | Status: DC
  Administered 2018-01-03 (×2): 1 g via INTRAVENOUS
  Filled 2018-01-02 (×2): qty 1

## 2018-01-02 MED ORDER — BUPROPION HCL ER (SR) 150 MG PO TB12
150.0000 mg | ORAL_TABLET | Freq: Two times a day (BID) | ORAL | Status: DC
  Administered 2018-01-02 – 2018-01-11 (×16): 150 mg via ORAL
  Filled 2018-01-02 (×16): qty 1

## 2018-01-02 MED ORDER — FENOFIBRATE 160 MG PO TABS
160.0000 mg | ORAL_TABLET | Freq: Every day | ORAL | Status: DC
  Administered 2018-01-03 – 2018-01-11 (×8): 160 mg via ORAL
  Filled 2018-01-02 (×9): qty 1

## 2018-01-02 MED ORDER — GABAPENTIN 300 MG PO CAPS
600.0000 mg | ORAL_CAPSULE | Freq: Two times a day (BID) | ORAL | Status: DC
  Administered 2018-01-02 – 2018-01-11 (×16): 600 mg via ORAL
  Filled 2018-01-02 (×16): qty 2

## 2018-01-02 MED ORDER — VANCOMYCIN HCL 10 G IV SOLR
1500.0000 mg | INTRAVENOUS | Status: AC
  Administered 2018-01-02: 1500 mg via INTRAVENOUS
  Filled 2018-01-02: qty 1500

## 2018-01-02 MED ORDER — INSULIN GLARGINE 100 UNIT/ML ~~LOC~~ SOLN
20.0000 [IU] | Freq: Every day | SUBCUTANEOUS | Status: DC
  Administered 2018-01-02 – 2018-01-10 (×9): 20 [IU] via SUBCUTANEOUS
  Filled 2018-01-02 (×11): qty 0.2

## 2018-01-02 MED ORDER — ONDANSETRON HCL 4 MG PO TABS: 4.0000 mg | ORAL_TABLET | Freq: Four times a day (QID) | ORAL | Status: DC | PRN

## 2018-01-02 MED ORDER — INSULIN DEGLUDEC 100 UNIT/ML ~~LOC~~ SOPN: 20.0000 [IU] | PEN_INJECTOR | Freq: Every day | SUBCUTANEOUS | Status: DC

## 2018-01-02 MED ORDER — METHIMAZOLE 5 MG PO TABS
10.0000 mg | ORAL_TABLET | Freq: Every day | ORAL | Status: DC
  Administered 2018-01-02 – 2018-01-11 (×9): 10 mg via ORAL
  Filled 2018-01-02 (×4): qty 1
  Filled 2018-01-02: qty 2
  Filled 2018-01-02: qty 1
  Filled 2018-01-02: qty 2
  Filled 2018-01-02: qty 1
  Filled 2018-01-02: qty 2
  Filled 2018-01-02 (×2): qty 1

## 2018-01-02 MED ORDER — SODIUM CHLORIDE 0.9% FLUSH
3.0000 mL | Freq: Two times a day (BID) | INTRAVENOUS | Status: DC
  Administered 2018-01-03 – 2018-01-09 (×9): 3 mL via INTRAVENOUS

## 2018-01-02 NOTE — ED Provider Notes (Signed)
[facility] DEPT Provider Note   CSN: [id number] Arrival date & time: 01/02/18  1226     History   Chief Complaint Chief Complaint  Patient presents with  . Cellulitis    left foot    HPI Judith [name] is a 67 y.o. female history of insulin-dependent diabetes, CKD, right lower extremity amputation (by Dr. Sharol Given) presents the emergent department today for worsening cellulitis of her left foot as well as a wound.  Patient reports that her symptoms began 5 days ago as a small wound on the ball of her left foot.  She had some surrounding erythema and heat.  She was seen at from an urgent care where she had a reassuring x-ray and was placed on Keflex and doxycycline.  She reports that since that time she is developed fever of 103 and feels very nauseated.  She was seen by her podiatrist today who recommended going to the emergency department for further evaluation.  Patient has been on 2 days of the anti-back stent is been compliant with it.  She denies any vomiting, abdominal pain.  She reports the redness and her pain has progressed since time of onset.  She notes increased swelling.  She notes a foul odor and clear/purulent-like discharge from the bottom of her foot. Rates her current pain level as a 10/10.   HPI  Past Medical History:  Diagnosis Date  . Abnormal EKG 07/31/2013  . Arthritis    "hands" (03/06/2015)  . Asthma   . Carpal tunnel syndrome, bilateral   . Chronic kidney disease   . Complication of anesthesia    slow to wake up  . Coronary artery disease     2 v CAD with CTO of the RCA and high grade bifurcational LCx/OM stenosis. S/P PCI DES x 2 to the LCx/OM.  Marland Kitchen Diabetic peripheral neuropathy ([place]) "since 1996"  . GERD (gastroesophageal reflux disease)   . Goiter   . Headache    migraines prior to menopause  . History of shingles 06/01/2013  . Hyperlipidemia LDL goal <70 10/13/2015  . Hypertension   . Hyperthyroidism   . PAF (paroxysmal  atrial fibrillation) ([place]) 04/29/2015   CHADS2VASC score of 5 now on Apixaban  . Pneumonia ~ 1976  . Tremors of nervous system   . Type II diabetes mellitus (H[facility])    insulin dependent    Patient Active Problem List   Diagnosis Date Noted  . LGI bleed   . Acute blood loss anemia   . Wide-complex tachycardia ([place]) 07/04/2017  . Type II diabetes mellitus, uncontrolled ([place]) 07/04/2017  . Peripheral neuropathy 07/04/2017  . H/O hyperthyroidism 07/04/2017  . S/P transmetatarsal amputation of foot, right ([place]) 04/19/2016  . Obstructive sleep apnea 11/26/2015  . Bilateral carpal tunnel syndrome 11/26/2015  . Hypomagnesemia 11/16/2015  . Hyperlipidemia LDL goal <70 10/13/2015  . Abnormality of gait 09/02/2015  . Memory loss 08/12/2015  . Diabetic peripheral neuropathy ([place]) 08/12/2015  . Vitamin D deficiency 08/12/2015  . Hyperthyroidism 04/30/2015  . Heme positive stool   . Persistent atrial fibrillation ([place]) 04/29/2015  . Coronary artery disease with stable angina pectoris ([place]) 03/29/2015  . Abnormal nuclear stress test   . History of goiter 09/28/2014  . GERD (gastroesophageal reflux disease) 07/31/2013  . Depression with anxiety 06/01/2013  . DM2 (diabetes mellitus, type 2) ([place]) 01/30/2013  . HTN (hypertension) 01/30/2013  . Tobacco user 01/30/2013    Past Surgical History:  Procedure Laterality Date  . ABDOMINAL  HYSTERECTOMY  1988   age 61; CERVICAL DYSPLASIA; ovaries intact.   . AMPUTATION Right 01/23/2016   Procedure: Right 3rd Ray Amputation;  Surgeon: [name], MD;  Location: [facility];  Service: Orthopedics;  Laterality: Right;  . AMPUTATION Right 02/13/2016   Procedure: Right Transmetatarsal Amputation;  Surgeon: [name], MD;  Location: [facility];  Service: Orthopedics;  Laterality: Right;  . CARDIAC CATHETERIZATION N/A 02/27/2015   Procedure: Left Heart Cath and Coronary Angiography;  Surgeon: [name], MD; LAD 40%, mCFX 80%, OM 70%, RCA 100% calcified        . CARDIAC CATHETERIZATION N/A 03/06/2015   Procedure: Coronary Stent Intervention;  Surgeon: [name], MD;  Location: [facility];  Service: Cardiovascular;  Laterality: N/A;  Mid CX 3.50x12 promus DES w/ 0% resdual and Prox OM1 2.50x20 promus DES w/ 20% residual  . CARDIOVERSION    . CARPAL TUNNEL RELEASE Right Nov 2015  . CARPAL TUNNEL RELEASE Right 1992; 05/2014   [name]; [place]  . CESAREAN SECTION  1982; 1984  . FOOT NEUROMA SURGERY Bilateral 2000  . KNEE ARTHROSCOPY Right ~ 2003   "meniscus repair"  . SHOULDER OPEN ROTATOR CUFF REPAIR Right 1996; 1998   "w/fracture repair"  . THYROID SURGERY  2000   "removed lots of nodules"  . TONSILLECTOMY  1976     OB History   None      Home Medications    Prior to Admission medications   Medication Sig Start Date End Date Taking? Authorizing Provider  albuterol (PROVENTIL HFA) 108 (90 Base) MCG/ACT inhaler Inhale 1-2 puffs into the lungs every 6 (six) hours as needed for wheezing or shortness of breath. 11/07/15   [name], MD  albuterol (PROVENTIL) (2.5 MG/3ML) 0.083% nebulizer solution Take 3 mLs (2.5 mg total) by nebulization every 6 (six) hours as needed for wheezing or shortness of breath. 08/31/17   [name], PA-C  Alcohol Swabs (B-D SINGLE USE SWABS REGULAR) PADS 1 each by Does not apply route 2 (two) times daily. 08/29/17   Renato Shin, MD  amLODipine (NORVASC) 10 MG tablet Take 1 tablet (10 mg total) by mouth daily. 08/22/17   Sueanne Margarita, MD  apixaban (ELIQUIS) 5 MG TABS tablet Take 1 tablet (5 mg total) by mouth 2 (two) times daily. 08/17/17   Sueanne Margarita, MD  atorvastatin (LIPITOR) 20 MG tablet Take 1 tablet (20 mg total) by mouth daily. 08/22/17   Sueanne Margarita, MD  Blood Glucose Calibration (TRUE METRIX LEVEL 1) Low SOLN 1 each by In Vitro route as needed. 08/25/17   Renato Shin, MD  Blood Glucose Monitoring Suppl (TRUE METRIX AIR GLUCOSE METER) w/Device KIT 1 each by Does not apply  route daily. 08/25/17   Renato Shin, MD  buPROPion (WELLBUTRIN) 75 MG tablet Take 1 tablet (75 mg total) by mouth every 6 (six) hours. 10/07/17   [name], PA-C  cephALEXin (KEFLEX) 500 MG capsule Take 2 capsules (1,000 mg total) by mouth 2 (two) times daily for 10 days. 12/31/17 01/10/18  Emeterio Reeve, DO  doxycycline (VIBRA-TABS) 100 MG tablet Take 1 tablet (100 mg total) by mouth 2 (two) times daily for 10 days. 12/31/17 01/10/18  Emeterio Reeve, DO  fenofibrate (TRICOR) 145 MG tablet Take 1 tablet (145 mg total) by mouth daily. 08/22/17 08/17/18  Sueanne Margarita, MD  FLUoxetine (PROZAC) 20 MG capsule Take 1 capsule (20 mg total) by mouth at bedtime. 08/31/17  [name], PA-C  gabapentin (NEURONTIN) 300 MG capsule Take 3 capsules (900 mg total) by mouth 2 (two) times daily. TAKE 3 CAPSULES BY MOUTH EVERY  MORNING AND 3 CAPSULES AT BEDTIME 08/31/17   Philis Fendt L, PA-C  glucose blood (TRUE METRIX BLOOD GLUCOSE TEST) test strip Used to check blood sugars 2x daily. 08/25/17   Renato Shin, MD  insulin degludec (TRESIBA FLEXTOUCH) 100 UNIT/ML SOPN FlexTouch Pen Inject 0.2 mLs (20 Units total) into the skin daily at 10 pm. 07/05/17   Cristal Ford, DO  insulin lispro (HUMALOG KWIKPEN) 100 UNIT/ML KiwkPen three times daily (just before meals) 17-22-12 units. 12/05/17   Renato Shin, MD  Insulin Pen Needle 32G X 4 MM MISC Use to inject insulin daily 08/13/16   Renato Shin, MD  Insulin Syringe-Needle U-100 (INSULIN SYRINGE .5CC/31GX5/16") 31G X 5/16" 0.5 ML MISC Use to inject insulin 08/13/16   Renato Shin, MD  magnesium oxide (MAG-OX) 400 (241.3 Mg) MG tablet Take 1 tablet (400 mg total) by mouth daily. 10/07/17   [name], PA-C  metFORMIN (GLUCOPHAGE-XR) 500 MG 24 hr tablet Take 4 tablets (2,000 mg total) by mouth daily with breakfast. Patient taking differently: Take 1,000 mg by mouth 2 (two) times daily.  08/25/17   Renato Shin, MD  methimazole (TAPAZOLE) 10 MG tablet Take  1 tablet (10 mg total) by mouth daily. 08/25/17   Renato Shin, MD  metoprolol succinate (TOPROL-XL) 25 MG 24 hr tablet Take 1 tablet (25 mg total) by mouth 2 (two) times daily. 08/22/17   Sueanne Margarita, MD  nitroGLYCERIN (NITROSTAT) 0.4 MG SL tablet Place 1 tablet (0.4 mg total) under the tongue every 5 (five) minutes as needed for chest pain. 02/10/17   [name], PA-C  pantoprazole (PROTONIX) 40 MG tablet Take 1 tablet (40 mg total) by mouth daily. 08/31/17   [name], PA-C  potassium chloride SA (K-DUR,KLOR-CON) 20 MEQ tablet Take 1 tablet (20 mEq total) by mouth 2 (two) times daily. 10/28/17   Pattricia Boss, MD  silver sulfADIAZINE (SILVADENE) 1 % cream Apply 1 application topically daily. 07/01/17   Trula Slade, DPM  sotalol (BETAPACE) 120 MG tablet Take 1 tablet (120 mg total) by mouth every 12 (twelve) hours. 08/22/17   Sueanne Margarita, MD  SSD 1 % cream APPLY TO AFFECTED AREA ONCE DAILY 03/11/17   [name], MD  traMADol (ULTRAM) 50 MG tablet Take 1 tablet (50 mg total) by mouth every 8 (eight) hours as needed. Patient taking differently: Take 50 mg by mouth every 8 (eight) hours as needed for moderate pain.  08/31/17   [name], PA-C  triamcinolone cream (KENALOG) 0.1 % Apply 1 application topically 2 (two) times daily. 10/07/17   [name], PA-C  TRUEPLUS LANCETS 28G MISC 1 each by Does not apply route 2 (two) times daily. 08/29/17   Renato Shin, MD    Family History Family History  Problem Relation Age of Onset  . Cancer Mother 61       bronchial cancer  . Breast cancer Mother   . Lung cancer Mother   . Hypertension Father   . COPD Father   . Heart disease Father 13       CAD with cardiac stenting  . Heart attack Father   . Parkinson's disease Father   . Allergies Sister   . Breast cancer Maternal Grandmother   . Emphysema Maternal Grandfather   . Leukemia Paternal Grandmother   .  Emphysema Paternal Grandfather   . Thyroid disease Neg Hx      Social History Social History   Tobacco Use  . Smoking status: Former Smoker    Packs/day: 0.00    Years: 41.00    Pack years: 0.00    Types: Cigarettes    Last attempt to quit: 03/05/2015    Years since quitting: 2.8  . Smokeless tobacco: Never Used  . Tobacco comment: 04/29/2015 "quit smoking cigarettes 02/27/2015"  Substance Use Topics  . Alcohol use: No  . Drug use: No     Allergies   Contrast media [iodinated diagnostic agents]; Dilaudid [hydromorphone hcl]; Novolog [insulin aspart]; Codeine; Iodine; Penicillins; Propofol; Ace inhibitors; Demerol [meperidine]; Neosporin [neomycin-bacitracin zn-polymyx]; Percocet [oxycodone-acetaminophen]; and Tape   Review of Systems Review of Systems  All other systems reviewed and are negative.    Physical Exam Updated Vital Signs BP (!) 125/108 (BP Location: Left Arm)   Pulse 69   Temp 100 F (37.8 C) (Oral)   Resp 18   SpO2 99%   Physical Exam  Constitutional: She appears well-developed and well-nourished.  HENT:  Head: Normocephalic and atraumatic.  Right Ear: External ear normal.  Left Ear: External ear normal.  Eyes: Conjunctivae are normal. Right eye exhibits no discharge. Left eye exhibits no discharge. No scleral icterus.  Cardiovascular: Normal rate, regular rhythm and normal heart sounds.  Pulses:      Dorsalis pedis pulses are 2+ on the left side.       Posterior tibial pulses are 2+ on the left side.  Pulmonary/Chest: Effort normal and breath sounds normal. No respiratory distress.  Abdominal: Soft. She exhibits no distension and no mass. There is no tenderness. There is no guarding.  Musculoskeletal:       Right ankle: Normal.  Prior right foot amputation without complications. On the ball of the left foot proximal to the third/fourth metatarsal there is a ulceration with clear discharge that is malodorous. There is 7cm x 8cm area of erythema and heat on the plantar and dorsal aspects of the foot with  extensions into the toes as seen in the picture below. There is ttp over this area as well as the ankle and lower leg. Trace edema noted.    Neurological: She is alert.  Skin: Skin is warm. There is erythema. No pallor.  Psychiatric: She has a normal mood and affect.  Nursing note and vitals reviewed.         ED Treatments / Results  Labs (all labs ordered are listed, but only abnormal results are displayed) Labs Reviewed  BASIC METABOLIC PANEL - Abnormal; Notable for the following components:      Result Value   Chloride 93 (*)    Glucose, Bld 204 (*)    Creatinine, Ser 1.03 (*)    GFR calc non Af Amer 56 (*)    All other components within normal limits  CBC WITH DIFFERENTIAL/PLATELET - Abnormal; Notable for the following components:   WBC 16.9 (*)    RBC 3.56 (*)    Hemoglobin 9.7 (*)    HCT 29.8 (*)    Platelets 452 (*)    Neutro Abs 14.5 (*)    Monocytes Absolute 1.2 (*)    All other components within normal limits  I-STAT CG4 LACTIC ACID, ED - Abnormal; Notable for the following components:   Lactic Acid, Venous 1.99 (*)    All other components within normal limits  CULTURE, BLOOD (ROUTINE X 2)  CULTURE, BLOOD (  ROUTINE X 2)  SEDIMENTATION RATE  C-REACTIVE PROTEIN  I-STAT CG4 LACTIC ACID, ED    EKG None  Radiology Dg Foot Complete Left  Result Date: 01/02/2018 CLINICAL DATA:  Cellulitis. On antibiotics for 2 days, worsening symptoms. EXAM: LEFT FOOT - COMPLETE 3+ VIEW COMPARISON:  None. FINDINGS: No acute fracture deformity or dislocation. No destructive bony lesions. Moderate plantar calcaneal spur. Calcification at Achilles insertion seen with old injury. Dorsal foot soft tissue swelling with forefoot dorsal subcutaneous gas. IMPRESSION: 1. Soft tissue swelling with dorsal forefoot subcutaneous gas concerning for necrotizing fasciitis. 2. No acute osseous process. Acute findings discussed with and reconfirmed by PA.Kennisha Qin on 01/02/2018 at 1:49 pm.  Electronically Signed   By: Elon Alas M.D.   On: 01/02/2018 13:49   Dg Foot Complete Right  Result Date: 12/31/2017 CLINICAL DATA:  foot sweling, eval for subq air, bone destruction, FB. Diabetic EXAM: RIGHT FOOT COMPLETE - 3+ VIEW COMPARISON:  None. FINDINGS: Osseous alignment is normal. Bone mineralization is normal. No fracture line or displaced fracture fragment. No acute or suspicious osseous lesion. No destructive change to suggest osteomyelitis. No significant degenerative change. No soft tissue gas. No foreign body appreciated within the soft tissues. Incidental note made of chronic osseous spurring at the plantar margin of the posterior calcaneus and linear calcifications at the Achilles tendon insertion site suggesting chronic calcific tendinopathy. IMPRESSION: No osseous abnormality.  No soft tissue gas.  No foreign body seen. Electronically Signed   By: Franki Cabot M.D.   On: 12/31/2017 16:38    Procedures Procedures (including critical care time)  Medications Ordered in ED Medications  sodium chloride 0.9 % bolus 500 mL (500 mLs Intravenous New Bag/Given 01/02/18 1409)  ceFEPIme (MAXIPIME) 2 g in sodium chloride 0.9 % 100 mL IVPB (has no administration in time range)  piperacillin-tazobactam (ZOSYN) IVPB 3.375 g (3.375 g Intravenous New Bag/Given 01/02/18 1451)  sodium chloride 0.9 % bolus 500 mL (has no administration in time range)  ondansetron (ZOFRAN) injection 4 mg (4 mg Intravenous Given 01/02/18 1452)  morphine 4 MG/ML injection 4 mg (4 mg Intravenous Given 01/02/18 1448)     Initial Impression / Assessment and Plan / ED Course  I have reviewed the triage vital signs and the nursing notes.  Pertinent labs & imaging results that were available during my care of the patient were reviewed by me and considered in my medical decision making (see chart for details).     67 y.o. female with hx of insulin-dependent diabetes presenting with failed outpatient therapy for  cellulitis.  She is taken 2 days of doxycycline and Keflex.  She reports fever and nausea at home.  She is noted to have an area of erythema, heat and ulceration to the bottom of the left foot.  She is neurovascular intact. No gross abscess.  X-ray obtained to rule out osteo versus subcu gas.  There is evidence of subcutaneous gas.  There is evidence of subcu gas on x-ray. Labs are pending. Will order abx and consult Dr. Sharol Given. Fluid bolus given. Pain medication given.   2:04 PM Spoke with pharmacist- Sharyn Lull. Recommended 2g Cefepime and 2g Zosyn. Ordered. Dr. Sharol Given consulted.   2:18 PM Discussed with Dr. Sharol Given who reviewed xrays. Believes air in soft tissue from ulcer and not necrotizing fasciitis. Recommended IV abx & hospitalist to admit. He will consult in the hospital and follow.   3:07 PM Labs reviewed.  Lactic acid mildly elevated at 1.99.  Will continue  IV fluid hydration.  Code sepsis was not called.  Patient is without hypotension, tachycardia, tachypnea or hypoxia.  Patient does have a leukocytosis of 16.9.  She does have anemia of 9.7 is below her baseline.  Will ask follow-up questions regards to this.  Patient with kidney function at baseline.  No anion gap acidosis.  ESR, CRP, blood cultures pending. Will call for admission.   I appreciate the hospitalist team for admitting the patient to the hospitalist service.  Patient given as needed dose of morphine.  Her pain is currently controlled.  Vital signs are stable.  Family and patient updated on plan and agreement.  EKG ordered per hospitalist request.  Patient case seen and discussed with Dr. Ronnald Nian who is in agreement with plan.   Final Clinical Impressions(s) / ED Diagnoses   Final diagnoses:  Cellulitis of left lower extremity  History of immunodeficiency  Abnormal x-ray    ED Discharge Orders    None       Jillyn Ledger, PA-C 01/02/18 [place], [place], DO 01/02/18 2053

## 2018-01-02 NOTE — H&P (Addendum)
History and Physical    Judith [name]  XKG:[id number]  DOB: Apr 21, 1951  DOA: 01/02/2018 PCP: [name], PA-C   Patient coming from: home  Chief Complaint: redness in left foot  HPI: Judith [name] is a 67 y.o. female with medical history of IDDM with neuropathy, CKD 3, HTN, HLD, PAF, right foot amputation who has been treated as outpt for a cellulitis of her left forefoot with Doxy and Cephalexin for the past 2 days without any noted improvement. The foot has become more swollen and the sole of her foot has developed an area which is draining a clear liquid. No fevers or chills. No lumps in her groin.  She has also had a cough productive of green sputum for 2 days. No dyspnea.  She is eating poorly due to nausea for a few days now but is drinking fluids well.  ED Course: fever 100, Given Vanc and Cefepime and NS boluses  Review of Systems:  Constipated for 3 days now. All other systems reviewed and apart from HPI, are negative.  Past Medical History:  Diagnosis Date  . Abnormal EKG 07/31/2013  . Arthritis    "hands" (03/06/2015)  . Asthma   . Carpal tunnel syndrome, bilateral   . Chronic kidney disease   . Complication of anesthesia    slow to wake up  . Coronary artery disease     2 v CAD with CTO of the RCA and high grade bifurcational LCx/OM stenosis. S/P PCI DES x 2 to the LCx/OM.  Marland Kitchen Diabetic peripheral neuropathy ([place]) "since 1996"  . GERD (gastroesophageal reflux disease)   . Goiter   . Headache    migraines prior to menopause  . History of shingles 06/01/2013  . Hyperlipidemia LDL goal <70 10/13/2015  . Hypertension   . Hyperthyroidism   . PAF (paroxysmal atrial fibrillation) ([place]) 04/29/2015   CHADS2VASC score of 5 now on Apixaban  . Pneumonia ~ 1976  . Tremors of nervous system   . Type II diabetes mellitus (HCC)    insulin dependent    Past Surgical History:  Procedure Laterality Date  . ABDOMINAL HYSTERECTOMY  1988   age 9; CERVICAL DYSPLASIA;  ovaries intact.   . AMPUTATION Right 01/23/2016   Procedure: Right 3rd Ray Amputation;  Surgeon: [name], MD;  Location: [facility];  Service: Orthopedics;  Laterality: Right;  . AMPUTATION Right 02/13/2016   Procedure: Right Transmetatarsal Amputation;  Surgeon: [name], MD;  Location: [facility];  Service: Orthopedics;  Laterality: Right;  . CARDIAC CATHETERIZATION N/A 02/27/2015   Procedure: Left Heart Cath and Coronary Angiography;  Surgeon: [name], MD; LAD 40%, mCFX 80%, OM 70%, RCA 100% calcified       . CARDIAC CATHETERIZATION N/A 03/06/2015   Procedure: Coronary Stent Intervention;  Surgeon: [name], MD;  Location: [facility];  Service: Cardiovascular;  Laterality: N/A;  Mid CX 3.50x12 promus DES w/ 0% resdual and Prox OM1 2.50x20 promus DES w/ 20% residual  . CARDIOVERSION    . CARPAL TUNNEL RELEASE Right Nov 2015  . CARPAL TUNNEL RELEASE Right 1992; 05/2014   [name]; [place]  . CESAREAN SECTION  1982; 1984  . FOOT NEUROMA SURGERY Bilateral 2000  . KNEE ARTHROSCOPY Right ~ 2003   "meniscus repair"  . SHOULDER OPEN ROTATOR CUFF REPAIR Right 1996; 1998   "w/fracture repair"  . THYROID SURGERY  2000   "removed lots of nodules"  . [place]  History:   reports that she quit smoking about 2 years ago. Her smoking use included cigarettes. She smoked 0.00 packs per day for 41.00 years. She has never used smokeless tobacco. She reports that she does not drink alcohol or use drugs.  Allergies  Allergen Reactions  . Contrast Media [Iodinated Diagnostic Agents] Hives    Spoke to patient, Iodine allergy is really IV contrast allergy.   . Dilaudid [Hydromorphone Hcl] Other (See Comments)    HEADACHE  . Novolog [Insulin Aspart] Shortness Of Breath    "breathing problems"  . Codeine Nausea And Vomiting    HIGH DOSES-SEVERE VOMITING  . Iodine Other (See Comments)    MUST HAVE BENADRYL PRIOR TO PROCEDURE AND RIGHT BEFORE TREATMENT TO  COUNTERACT REACTION-BLISTERING REACTION DERMATOLOGICAL  . Penicillins Itching, Rash and Other (See Comments)    Has patient had a PCN reaction causing immediate rash, facial/tongue/throat swelling, SOB or lightheadedness with hypotension: no Has patient had a PCN reaction causing severe rash involving mucus membranes or skin necrosis: No Has patient had a PCN reaction that required hospitalization No Has patient had a PCN reaction occurring within the last 10 years: No If all of the above answers are "NO", then may proceed with Cephalosporin use.  CHEST SIZED RASH AND ITCHING   . Propofol Other (See Comments)    "Breathing problems - asthma attack" Can take with benadryl   . Ace Inhibitors Cough  . Demerol [Meperidine] Nausea And Vomiting  . Neosporin [Neomycin-Bacitracin Zn-Polymyx] Itching and Rash    MAKES REACTIONS WORSE WHEN USING AS PROPHYLACTIC  . Percocet [Oxycodone-Acetaminophen] Rash  . Tape Itching and Rash    Family History  Problem Relation Age of Onset  . Cancer Mother 11       bronchial cancer  . Breast cancer Mother   . Lung cancer Mother   . Hypertension Father   . COPD Father   . Heart disease Father 52       CAD with cardiac stenting  . Heart attack Father   . Parkinson's disease Father   . Allergies Sister   . Breast cancer Maternal Grandmother   . Emphysema Maternal Grandfather   . Leukemia Paternal Grandmother   . Emphysema Paternal Grandfather   . Thyroid disease Neg Hx      Prior to Admission medications   Medication Sig Start Date End Date Taking? Authorizing Provider  albuterol (PROVENTIL HFA) 108 (90 Base) MCG/ACT inhaler Inhale 1-2 puffs into the lungs every 6 (six) hours as needed for wheezing or shortness of breath. 11/07/15  Yes [name], MD  albuterol (PROVENTIL) (2.5 MG/3ML) 0.083% nebulizer solution Take 3 mLs (2.5 mg total) by nebulization every 6 (six) hours as needed for wheezing or shortness of breath. 08/31/17  Yes [name], PA-C  amLODipine (NORVASC) 10 MG tablet Take 1 tablet (10 mg total) by mouth daily. 08/22/17  Yes Turner, Eber Hong, MD  apixaban (ELIQUIS) 5 MG TABS tablet Take 1 tablet (5 mg total) by mouth 2 (two) times daily. 08/17/17  Yes Turner, Eber Hong, MD  atorvastatin (LIPITOR) 20 MG tablet Take 1 tablet (20 mg total) by mouth daily. 08/22/17  Yes Turner, Eber Hong, MD  buPROPion (WELLBUTRIN SR) 150 MG 12 hr tablet Take 150 mg by mouth 2 (two) times daily.   Yes [provider]  cephALEXin (KEFLEX) 500 MG capsule Take 2 capsules (1,000 mg total) by mouth 2 (two) times daily for 10 days. 12/31/17 01/10/18 Yes Alexander,  Natalie, DO  doxycycline (VIBRA-TABS) 100 MG tablet Take 1 tablet (100 mg total) by mouth 2 (two) times daily for 10 days. 12/31/17 01/10/18 Yes Emeterio Reeve, DO  fenofibrate (TRICOR) 145 MG tablet Take 1 tablet (145 mg total) by mouth daily. 08/22/17 08/17/18 Yes Turner, Eber Hong, MD  FLUoxetine (PROZAC) 20 MG capsule Take 1 capsule (20 mg total) by mouth at bedtime. 08/31/17  Yes [name], PA-C  gabapentin (NEURONTIN) 300 MG capsule Take 3 capsules (900 mg total) by mouth 2 (two) times daily. TAKE 3 CAPSULES BY MOUTH EVERY  MORNING AND 3 CAPSULES AT BEDTIME Patient taking differently: Take 600 mg by mouth 2 (two) times daily.  08/31/17  Yes [name], PA-C  insulin degludec (TRESIBA FLEXTOUCH) 100 UNIT/ML SOPN FlexTouch Pen Inject 0.2 mLs (20 Units total) into the skin daily at 10 pm. 07/05/17  Yes Mikhail, Velta Addison, DO  insulin lispro (HUMALOG KWIKPEN) 100 UNIT/ML KiwkPen three times daily (just before meals) 17-22-12 units. Patient taking differently: Inject 12-22 Units into the skin See admin instructions. three times daily (just before meals) 17-22-12 units. 12/05/17  Yes Renato Shin, MD  magnesium oxide (MAG-OX) 400 (241.3 Mg) MG tablet Take 1 tablet (400 mg total) by mouth daily. 10/07/17  Yes [name], PA-C  metFORMIN (GLUCOPHAGE-XR) 500 MG 24 hr tablet  Take 4 tablets (2,000 mg total) by mouth daily with breakfast. Patient taking differently: Take 1,000 mg by mouth 2 (two) times daily.  08/25/17  Yes Renato Shin, MD  methimazole (TAPAZOLE) 10 MG tablet Take 1 tablet (10 mg total) by mouth daily. 08/25/17  Yes Renato Shin, MD  metoprolol succinate (TOPROL-XL) 25 MG 24 hr tablet Take 1 tablet (25 mg total) by mouth 2 (two) times daily. Patient taking differently: Take 25 mg by mouth See admin instructions. Takes 1 tablet twice daily. Then 1 tablet daily as needed for a.fib 08/22/17  Yes Turner, Eber Hong, MD  nitroGLYCERIN (NITROSTAT) 0.4 MG SL tablet Place 1 tablet (0.4 mg total) under the tongue every 5 (five) minutes as needed for chest pain. 02/10/17  Yes [name], PA-C  pantoprazole (PROTONIX) 40 MG tablet Take 1 tablet (40 mg total) by mouth daily. 08/31/17  Yes [name], PA-C  potassium chloride SA (K-DUR,KLOR-CON) 20 MEQ tablet Take 1 tablet (20 mEq total) by mouth 2 (two) times daily. 10/28/17  Yes Pattricia Boss, MD  silver sulfADIAZINE (SILVADENE) 1 % cream Apply 1 application topically daily. Patient taking differently: Apply 1 application topically daily as needed (for sores on feet).  07/01/17  Yes Trula Slade, DPM  sotalol (BETAPACE) 120 MG tablet Take 1 tablet (120 mg total) by mouth every 12 (twelve) hours. 08/22/17  Yes Turner, Eber Hong, MD  traMADol (ULTRAM) 50 MG tablet Take 1 tablet (50 mg total) by mouth every 8 (eight) hours as needed. Patient taking differently: Take 50 mg by mouth every 8 (eight) hours as needed for moderate pain.  08/31/17  Yes [name], PA-C  triamcinolone cream (KENALOG) 0.1 % Apply 1 application topically 2 (two) times daily. Patient taking differently: Apply 1 application topically 2 (two) times daily as needed (for rash).  10/07/17  Yes [name], PA-C  Alcohol Swabs (B-D SINGLE USE SWABS REGULAR) PADS 1 each by Does not apply route 2 (two) times daily. 08/29/17   Renato Shin, MD    Blood Glucose Calibration (TRUE METRIX LEVEL 1) Low SOLN 1 each by In Vitro route as needed. 08/25/17  Renato Shin, MD  Blood Glucose Monitoring Suppl (TRUE METRIX AIR GLUCOSE METER) w/Device KIT 1 each by Does not apply route daily. 08/25/17   Renato Shin, MD  buPROPion (WELLBUTRIN) 75 MG tablet Take 1 tablet (75 mg total) by mouth every 6 (six) hours. Patient not taking: Reported on 01/02/2018 10/07/17   Philis Fendt L, PA-C  glucose blood (TRUE METRIX BLOOD GLUCOSE TEST) test strip Used to check blood sugars 2x daily. 08/25/17   Renato Shin, MD  Insulin Pen Needle 32G X 4 MM MISC Use to inject insulin daily 08/13/16   Renato Shin, MD  Insulin Syringe-Needle U-100 (INSULIN SYRINGE .5CC/31GX5/16") 31G X 5/16" 0.5 ML MISC Use to inject insulin 08/13/16   Renato Shin, MD  TRUEPLUS LANCETS 28G MISC 1 each by Does not apply route 2 (two) times daily. 08/29/17   Renato Shin, MD    Physical Exam: Wt Readings from Last 3 Encounters:  12/31/17 79.4 kg  12/12/17 79.5 kg  12/05/17 81.6 kg   Vitals:   01/02/18 1236 01/02/18 1615  BP: (!) 125/108   Pulse: 69 74  Resp: 18 13  Temp: 100 F (37.8 C)   TempSrc: Oral   SpO2: 99% 98%      Constitutional:  Calm & comfortable Eyes: PERRLA, lids and conjunctivae normal ENT:  Mucous membranes are moist.  Pharynx clear of exudate   Normal dentition.  Neck: Supple, no masses  Respiratory:  Clear to auscultation bilaterally  Normal respiratory effort.  Cardiovascular:  S1 & S2 heard, regular rate and rhythm No Murmurs Abdomen:  Non distended No tenderness, No masses Bowel sounds normal Extremities:  No clubbing / cyanosis No pedal edema No joint deformity    Skin:  No rashes, lesions or ulcers Neurologic:  AAO x 3 CN 2-12 grossly intact Sensation intact Strength 5/5 in all 4 extremities Psychiatric:  Normal Mood and affect    Labs on Admission: I have personally reviewed following labs and imaging studies  CBC: Recent  Labs  Lab 01/02/18 1413  WBC 16.9*  NEUTROABS 14.5*  HGB 9.7*  HCT 29.8*  MCV 83.7  PLT 341*   Basic Metabolic Panel: Recent Labs  Lab 01/02/18 1413  NA 135  K 3.5  CL 93*  CO2 28  GLUCOSE 204*  BUN 21  CREATININE 1.03*  CALCIUM 9.3   GFR: Estimated Creatinine Clearance: 55.5 mL/min (A) (by C-G formula based on SCr of 1.03 mg/dL (H)). Liver Function Tests: No results for input(s): AST, ALT, ALKPHOS, BILITOT, PROT, ALBUMIN in the last 168 hours. No results for input(s): LIPASE, AMYLASE in the last 168 hours. No results for input(s): AMMONIA in the last 168 hours. Coagulation Profile: No results for input(s): INR, PROTIME in the last 168 hours. Cardiac Enzymes: No results for input(s): CKTOTAL, CKMB, CKMBINDEX, TROPONINI in the last 168 hours. BNP (last 3 results) No results for input(s): PROBNP in the last 8760 hours. HbA1C: No results for input(s): HGBA1C in the last 72 hours. CBG: No results for input(s): GLUCAP in the last 168 hours. Lipid Profile: No results for input(s): CHOL, HDL, LDLCALC, TRIG, CHOLHDL, LDLDIRECT in the last 72 hours. Thyroid Function Tests: No results for input(s): TSH, T4TOTAL, FREET4, T3FREE, THYROIDAB in the last 72 hours. Anemia Panel: No results for input(s): VITAMINB12, FOLATE, FERRITIN, TIBC, IRON, RETICCTPCT in the last 72 hours. Urine analysis:    Component Value Date/Time   COLORURINE YELLOW 05/10/2016 0110   APPEARANCEUR HAZY (A) 05/10/2016 0110   LABSPEC 1.030 05/10/2016  0110   PHURINE 5.0 05/10/2016 0110   GLUCOSEU 150 (A) 05/10/2016 0110   HGBUR NEGATIVE 05/10/2016 0110   BILIRUBINUR negative 07/22/2017 0858   BILIRUBINUR Small 12/06/2014 1441   KETONESUR negative 07/22/2017 0858   KETONESUR 5 (A) 05/10/2016 0110   PROTEINUR >=300 (A) 07/22/2017 0858   PROTEINUR >=300 (A) 05/10/2016 0110   UROBILINOGEN 0.2 07/22/2017 0858   UROBILINOGEN 1.0 02/12/2015 1238   NITRITE Negative 07/22/2017 0858   NITRITE NEGATIVE  05/10/2016 0110   LEUKOCYTESUR Small (1+) (A) 07/22/2017 0858   Sepsis Labs: '@LABRCNTIP' (procalcitonin:4,lacticidven:4) )No results found for this or any previous visit (from the past 240 hour(s)).   Radiological Exams on Admission: Dg Foot Complete Left  Result Date: 01/02/2018 CLINICAL DATA:  Cellulitis. On antibiotics for 2 days, worsening symptoms. EXAM: LEFT FOOT - COMPLETE 3+ VIEW COMPARISON:  None. FINDINGS: No acute fracture deformity or dislocation. No destructive bony lesions. Moderate plantar calcaneal spur. Calcification at Achilles insertion seen with old injury. Dorsal foot soft tissue swelling with forefoot dorsal subcutaneous gas. IMPRESSION: 1. Soft tissue swelling with dorsal forefoot subcutaneous gas concerning for necrotizing fasciitis. 2. No acute osseous process. Acute findings discussed with and reconfirmed by PA.MICHAEL MACZIS on 01/02/2018 at 1:49 pm. Electronically Signed   By: Elon Alas M.D.   On: 01/02/2018 13:49   Dg Foot Complete Right  Result Date: 12/31/2017 CLINICAL DATA:  foot sweling, eval for subq air, bone destruction, FB. Diabetic EXAM: RIGHT FOOT COMPLETE - 3+ VIEW COMPARISON:  None. FINDINGS: Osseous alignment is normal. Bone mineralization is normal. No fracture line or displaced fracture fragment. No acute or suspicious osseous lesion. No destructive change to suggest osteomyelitis. No significant degenerative change. No soft tissue gas. No foreign body appreciated within the soft tissues. Incidental note made of chronic osseous spurring at the plantar margin of the posterior calcaneus and linear calcifications at the Achilles tendon insertion site suggesting chronic calcific tendinopathy. IMPRESSION: No osseous abnormality.  No soft tissue gas.  No foreign body seen. Electronically Signed   By: Franki Cabot M.D.   On: 12/31/2017 16:38    EKG: Independently reviewed. Sinus rhythm 74 bmp.   Assessment/Plan Principal Problem:   Cellulitis of left  forefoot / Sepsis in a Diabetic patient - failed outpt treatment- cont Vanc and Cefepime- ED has spoken with Dr Sharol Given regarding air in the soft tissue noted on xray and he will evaluate the patient   Active Problems:   Type II diabetes mellitus, uncontrolled/ Peripheral neuropathy - cont Tresiba- she has been taking it and despite her poor appetite, her sugars have remained high - place on SSI  Nausea - ? Due to Doxy - follow- regular diet for now- PRN Zofran  A-fib - she has been having palpitations lately- place on telemetry- cont Sotalol and Metoprolol- she was told to take and extra Metoprolol when having palpitations - cont Eliquis  COPD -cont Nebs and inhalers  Productive cough - ? Acute bronchitis- follow to see if it improves with current antibiotics    GERD (gastroesophageal reflux disease) - cont PPI    Hyperthyroidism - cont Tapazol    Obstructive sleep apnea - cont BiPAP - sister will bring her machine from home    S/P transmetatarsal amputation of foot, right       CKD 3 - stable- follow     DVT prophylaxis: Eliquis Code Status: full code Family Communication: sister   Disposition Plan: tele bed  Consults called: Dr Sharol Given called by ED  Admission status: inpatient    Debbe Odea MD [facility] Pager: [email or web address] Password [email or web address] 7PM-7AM, please contact night-coverage   01/02/2018, 4:23 PM

## 2018-01-02 NOTE — ED Triage Notes (Signed)
Pt sent by Dr. Jacqualyn Posey for IV abx for cellulitis of left leg. Pt reports 6/10 pain in leg. Pt has been taking oral abx for 2 days but states symptoms are worse.

## 2018-01-02 NOTE — Progress Notes (Addendum)
Subjective: 67 year old female presents the office today for a 5-day history of cellulitis to her left foot as well as a wound.  She states that she does not have a fever and she went to her primary care physician on Saturday and she was put on keflex and doxycycline. She states that since then she has developed a fever and antibiotics have not been helpful overall she feels very sick and she is nauseated.  She states that the redness has not improved.  She is not sure how the wound started. Denies any systemic complaints such as fevers, chills, nausea, vomiting. No acute changes since last appointment, and no other complaints at this time.   Objective: AAO x3, NAD DP/PT pulses palpable bilaterally, CRT less than 3 seconds On the left foot the metatarsal 3/4/ulceration without any probing to bone.  There is edema to the foot increase in warmth and there is cellulitis along the midfoot also marked.  There is very minimal purulence coming from the wound on the plantar aspect of the foot.  There is no fluctuation or crepitation.  There is mild malodor. TMA on the right No pain with calf compression, swelling, warmth, erythema        Assessment: Cellulitis, ulceration left foot  Plan: -All treatment options discussed with the patient including all alternatives, risks, complications.  -I reviewed the x-rays from her primary care physician.  No evidence of acute osteomyelitis.  Given her overall symptoms and she is developed a fever since Saturday she likely will need IV antibiotics.  I have contacted [facility] along the emergency department and the patient is currently brought there by her sister.  Think she is to be admitted to the hospital for IV antibiotics and the MRI of her left foot.  She is in agreement to this.  I will be more than happy to follow her while she is in the hospital. -Patient encouraged to call the office with any questions, concerns, change in symptoms.   Trula Slade  DPM O: [phone] C: [phone]  ---------------------  Per hospitalist/ED note Dr. Sharol Given has been consulted to see the patient. I called her to let her know this and if there is anything else she needs from me to give me a call.   Trula Slade DPM

## 2018-01-02 NOTE — ED Notes (Signed)
ED TO INPATIENT HANDOFF REPORT  Name/Age/Gender Judith [name] 67 y.o. female  Code Status    Code Status Orders  (From admission, onward)         Start     Ordered   01/02/18 1554  Full code  Continuous     01/02/18 1554        Code Status History    Date Active Date Inactive Code Status Order ID Comments User Context   08/06/2017 0026 08/10/2017 1503 Full Code [id number]  Nila Nephew, MD ED   07/04/2017 0808 07/05/2017 2346 Full Code [id number]  Samella Parr, NP ED   08/06/2016 2027 08/07/2016 1841 Full Code [id number]  Daune Perch, NP Inpatient   05/10/2016 0649 05/12/2016 1707 Full Code [id number]  Etta Quill, DO ED   11/15/2015 0425 11/18/2015 1746 Full Code [id number]  Wandra Mannan Inpatient   04/29/2015 2327 05/01/2015 1403 Full Code [id number]  Brett Canales, PA-C Inpatient   03/06/2015 1006 02/17/2015 2107 Full Code [id number]  [name], MD Inpatient   02/27/2015 1405 02/27/2015 1909 Full Code [id number]  [name], MD Inpatient   02/12/2015 1647 02/13/2015 1844 Full Code [id number]  Reyne Dumas, MD ED      Home/SNF/Other Home  Chief Complaint cellulitis  Level of Care/Admitting Diagnosis ED Disposition    ED Disposition Condition [place]: [facility] [[id number]]  Level of Care: Telemetry [5]  Admit to tele based on following criteria: Complex arrhythmia (Bradycardia/Tachycardia)  Diagnosis: Cellulitis [811572]  Admitting Physician: [place], [place]  Attending Physician: Debbe Odea [[id number]]  Estimated length of stay: past midnight tomorrow  Certification:: I certify this patient will need inpatient services for at least 2 midnights  PT Class (Do Not Modify): Inpatient [101]  PT Acc Code (Do Not Modify): Private [1]       Medical History Past Medical History:  Diagnosis Date  . Abnormal EKG 07/31/2013  . Arthritis    "hands" (03/06/2015)  . Asthma   . Carpal tunnel syndrome, bilateral   .  Chronic kidney disease   . Complication of anesthesia    slow to wake up  . Coronary artery disease     2 v CAD with CTO of the RCA and high grade bifurcational LCx/OM stenosis. S/P PCI DES x 2 to the LCx/OM.  Marland Kitchen Diabetic peripheral neuropathy ([place]) "since 1996"  . GERD (gastroesophageal reflux disease)   . Goiter   . Headache    migraines prior to menopause  . History of shingles 06/01/2013  . Hyperlipidemia LDL goal <70 10/13/2015  . Hypertension   . Hyperthyroidism   . PAF (paroxysmal atrial fibrillation) ([place]) 04/29/2015   CHADS2VASC score of 5 now on Apixaban  . Pneumonia ~ 1976  . Tremors of nervous system   . Type II diabetes mellitus (HCC)    insulin dependent    Allergies Allergies  Allergen Reactions  . Contrast Media [Iodinated Diagnostic Agents] Hives    Spoke to patient, Iodine allergy is really IV contrast allergy.   . Dilaudid [Hydromorphone Hcl] Other (See Comments)    HEADACHE  . Novolog [Insulin Aspart] Shortness Of Breath    "breathing problems"  . Codeine Nausea And Vomiting    HIGH DOSES-SEVERE VOMITING  . Iodine Other (See Comments)    MUST HAVE BENADRYL PRIOR TO PROCEDURE AND RIGHT BEFORE TREATMENT TO COUNTERACT REACTION-BLISTERING REACTION DERMATOLOGICAL  . Penicillins Itching, Rash and Other (See Comments)    Has patient had  a PCN reaction causing immediate rash, facial/tongue/throat swelling, SOB or lightheadedness with hypotension: no Has patient had a PCN reaction causing severe rash involving mucus membranes or skin necrosis: No Has patient had a PCN reaction that required hospitalization No Has patient had a PCN reaction occurring within the last 10 years: No If all of the above answers are "NO", then may proceed with Cephalosporin use.  CHEST SIZED RASH AND ITCHING   . Propofol Other (See Comments)    "Breathing problems - asthma attack" Can take with benadryl   . Ace Inhibitors Cough  . Demerol [Meperidine] Nausea And Vomiting  .  Neosporin [Neomycin-Bacitracin Zn-Polymyx] Itching and Rash    MAKES REACTIONS WORSE WHEN USING AS PROPHYLACTIC  . Percocet [Oxycodone-Acetaminophen] Rash  . Tape Itching and Rash    IV Location/Drains/Wounds Patient Lines/Drains/Airways Status   Active Line/Drains/Airways    Name:   Placement date:   Placement time:   Site:   Days:   Peripheral IV 01/02/18 Right Wrist   01/02/18    1402    Wrist   less than 1   Negative Pressure Wound Therapy Foot Right   02/13/16    0944    -   689   Incision (Closed) 01/23/16 Leg Right   01/23/16    1323     710   Incision (Closed) 02/13/16 Foot Right   02/13/16    0951     689   Wound / Incision (Open or Dehisced) 05/09/16 Diabetic ulcer Foot Right ulcer r/t amputation   05/09/16    -    Foot   603          Labs/Imaging Results for orders placed or performed during the hospital encounter of 01/02/18 (from the past 48 hour(s))  Basic metabolic panel     Status: Abnormal   Collection Time: 01/02/18  2:13 PM  Result Value Ref Range   Sodium 135 135 - 145 mmol/L   Potassium 3.5 3.5 - 5.1 mmol/L   Chloride 93 (L) 98 - 111 mmol/L   CO2 28 22 - 32 mmol/L   Glucose, Bld 204 (H) 70 - 99 mg/dL   BUN 21 8 - 23 mg/dL   Creatinine, Ser 1.03 (H) 0.44 - 1.00 mg/dL   Calcium 9.3 8.9 - 10.3 mg/dL   GFR calc non Af Amer 56 (L) >60 mL/min   GFR calc Af Amer >60 >60 mL/min    Comment: (NOTE) The eGFR has been calculated using the CKD EPI equation. This calculation has not been validated in all clinical situations. eGFR's persistently <60 mL/min signify possible Chronic Kidney Disease.    Anion gap 14 5 - 15    Comment: Performed at [facility], [place] [address], [place], [place] [address]  CBC with Differential     Status: Abnormal   Collection Time: 01/02/18  2:13 PM  Result Value Ref Range   WBC 16.9 (H) 4.0 - 10.5 K/uL   RBC 3.56 (L) 3.87 - 5.11 MIL/uL   Hemoglobin 9.7 (L) 12.0 - 15.0 g/dL   HCT 29.8 (L) 36.0 - 46.0 %   MCV 83.7  78.0 - 100.0 fL   MCH 27.2 26.0 - 34.0 pg   MCHC 32.6 30.0 - 36.0 g/dL   RDW 14.5 11.5 - 15.5 %   Platelets 452 (H) 150 - 400 K/uL   Neutrophils Relative % 86 %   Neutro Abs 14.5 (H) 1.7 - 7.7 K/uL   Lymphocytes Relative 7 %  Lymphs Abs 1.2 0.7 - 4.0 K/uL   Monocytes Relative 7 %   Monocytes Absolute 1.2 (H) 0.1 - 1.0 K/uL   Eosinophils Relative 0 %   Eosinophils Absolute 0.0 0.0 - 0.7 K/uL   Basophils Relative 0 %   Basophils Absolute 0.0 0.0 - 0.1 K/uL    Comment: Performed at [facility], [place] [address], [place], [place] [address]  Sedimentation rate     Status: Abnormal   Collection Time: 01/02/18  2:13 PM  Result Value Ref Range   Sed Rate 133 (H) 0 - 22 mm/hr    Comment: Performed at [facility], [place] [address] [place], [place] [address]  I-Stat CG4 Lactic Acid, ED     Status: Abnormal   Collection Time: 01/02/18  2:22 PM  Result Value Ref Range   Lactic Acid, Venous 1.99 (H) 0.5 - 1.9 mmol/L   Comment NOTIFIED PHYSICIAN    Dg Foot Complete Left  Result Date: 01/02/2018 CLINICAL DATA:  Cellulitis. On antibiotics for 2 days, worsening symptoms. EXAM: LEFT FOOT - COMPLETE 3+ VIEW COMPARISON:  None. FINDINGS: No acute fracture deformity or dislocation. No destructive bony lesions. Moderate plantar calcaneal spur. Calcification at Achilles insertion seen with old injury. Dorsal foot soft tissue swelling with forefoot dorsal subcutaneous gas. IMPRESSION: 1. Soft tissue swelling with dorsal forefoot subcutaneous gas concerning for necrotizing fasciitis. 2. No acute osseous process. Acute findings discussed with and reconfirmed by PA.MICHAEL MACZIS on 01/02/2018 at 1:49 pm. Electronically Signed   By: Elon Alas M.D.   On: 01/02/2018 13:49    Pending Labs Unresulted Labs (From admission, onward)    Start     Ordered   01/09/18 0500  Creatinine, serum  (enoxaparin (LOVENOX)    CrCl >/= 30 ml/min)  Weekly,   R    Comments:  while on  enoxaparin therapy    01/02/18 1554   01/03/18 7169  Basic metabolic panel  Tomorrow morning,   R     01/02/18 1554   01/03/18 0500  CBC  Tomorrow morning,   R     01/02/18 1554   01/02/18 1554  CBC  (enoxaparin (LOVENOX)    CrCl >/= 30 ml/min)  Once,   R    Comments:  Baseline for enoxaparin therapy IF NOT ALREADY DRAWN.  Notify MD if PLT < 100 K.    01/02/18 1554   01/02/18 1554  Creatinine, serum  (enoxaparin (LOVENOX)    CrCl >/= 30 ml/min)  Once,   R    Comments:  Baseline for enoxaparin therapy IF NOT ALREADY DRAWN.    01/02/18 1554   01/02/18 1553  HIV antibody (Routine Testing)  Once,   R     01/02/18 1554   01/02/18 1355  C-reactive protein  Once,   STAT     01/02/18 1355   01/02/18 1325  Culture, blood (routine x 2)  BLOOD CULTURE X 2,   STAT     01/02/18 1325          Vitals/Pain Today's Vitals   01/02/18 1236 01/02/18 1615  BP: (!) 125/108   Pulse: 69 74  Resp: 18 13  Temp: 100 F (37.8 C)   TempSrc: Oral   SpO2: 99% 98%  PainSc: 6      Isolation Precautions No active isolations  Medications Medications  morphine 4 MG/ML injection 4 mg (has no administration in time range)  enoxaparin (LOVENOX) injection 40 mg (has no administration in  time range)  sodium chloride flush (NS) 0.9 % injection 3 mL (has no administration in time range)  sodium chloride flush (NS) 0.9 % injection 3 mL (has no administration in time range)  0.9 %  sodium chloride infusion (has no administration in time range)  acetaminophen (TYLENOL) tablet 650 mg (has no administration in time range)    Or  acetaminophen (TYLENOL) suppository 650 mg (has no administration in time range)  ondansetron (ZOFRAN) tablet 4 mg (has no administration in time range)    Or  ondansetron (ZOFRAN) injection 4 mg (has no administration in time range)  albuterol (PROVENTIL) (2.5 MG/3ML) 0.083% nebulizer solution 2.5 mg (has no administration in time range)  amLODipine (NORVASC) tablet 10 mg (has no  administration in time range)  apixaban (ELIQUIS) tablet 5 mg (has no administration in time range)  atorvastatin (LIPITOR) tablet 20 mg (has no administration in time range)  buPROPion (WELLBUTRIN SR) 12 hr tablet 150 mg (has no administration in time range)  fenofibrate tablet 160 mg (has no administration in time range)  FLUoxetine (PROZAC) capsule 20 mg (has no administration in time range)  gabapentin (NEURONTIN) capsule 600 mg (has no administration in time range)  insulin degludec (TRESIBA) 100 UNIT/ML FlexTouch Pen 20 Units (has no administration in time range)  magnesium oxide (MAG-OX) tablet 400 mg (has no administration in time range)  methimazole (TAPAZOLE) tablet 10 mg (has no administration in time range)  metoprolol succinate (TOPROL-XL) 24 hr tablet 25 mg (has no administration in time range)  pantoprazole (PROTONIX) EC tablet 40 mg (has no administration in time range)  potassium chloride SA (K-DUR,KLOR-CON) CR tablet 20 mEq (has no administration in time range)  sotalol (BETAPACE) tablet 120 mg (has no administration in time range)  traMADol (ULTRAM) tablet 50 mg (has no administration in time range)  insulin aspart (novoLOG) injection 0-9 Units (has no administration in time range)  insulin aspart (novoLOG) injection 0-5 Units (has no administration in time range)  sodium chloride 0.9 % bolus 500 mL (500 mLs Intravenous New Bag/Given 01/02/18 1409)  ceFEPIme (MAXIPIME) 2 g in sodium chloride 0.9 % 100 mL IVPB (2 g Intravenous New Bag/Given 01/02/18 1549)  piperacillin-tazobactam (ZOSYN) IVPB 3.375 g (0 g Intravenous Stopped 01/02/18 1550)  sodium chloride 0.9 % bolus 500 mL (0 mLs Intravenous Stopped 01/02/18 1550)  ondansetron (ZOFRAN) injection 4 mg (4 mg Intravenous Given 01/02/18 1452)  morphine 4 MG/ML injection 4 mg (4 mg Intravenous Given 01/02/18 1448)    Mobility walks with device

## 2018-01-02 NOTE — ED Notes (Signed)
Pt c/o of left sided lower leg pain 8/10, left lower leg is swollen , red, and warm, and tender to touch. Pt sister is at bedside.

## 2018-01-02 NOTE — ED Provider Notes (Signed)
Medical screening examination/treatment/procedure(s) were conducted as a shared visit with non-physician practitioner(s) and myself.  I personally evaluated the patient during the encounter. Briefly, the patient is a 67 y.o. female with history of diabetes who presents to the ED with left foot infection.  Patient with redness and swelling to the left foot for the last several days.  Patient with urgent care and was treated with vancomycin and Keflex without much improvement.  Patient has redness and swelling to the left lower foot with tiny ulcer.  Concern for worsening cellulitis.  X-ray showed mild subcutaneous air which is likely from ulcer.  No concern for necrotizing fasciitis at this time.  Patient with failed outpatient therapy for cellulitis.  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  Patient with fever, leukocytosis and concern for sepsis.  Patient empirically given IV antibiotics with cefepime and zosyn.  Orthopedics consulted and medicine to admit for further IV antibiotics.  X-ray did not show any signs of osteomyelitis at this time.  Initial lactic acid within normal limits, hemodynamically stable to my care.  This chart was dictated using voice recognition software.  Despite best efforts to proofread,  errors can occur which can change the documentation meaning.    EKG Interpretation None           Lennice Sites, DO 01/02/18 1601

## 2018-01-02 NOTE — Progress Notes (Addendum)
Pharmacy Antibiotic Note  Judith [name] is a 67 y.o. female admitted on 01/02/2018 with cellulitis of left foot.  PMH significant for DM with neuropathy, CKD, HTN, PAF, and right foot amputation.  In the ED, patient received Zosyn 3.375gm IV x 1 dose @ 14:51 as well as Cefepime 2gm IV x 1 dose @ 15:49. Pharmacy has been consulted for Vancomycin and Cefepime dosing.  Plan:  Vancomycin 1500mg  IV x 1 dose followed by750mg  IV q24h (Goal AUC 400-500)  Cefepime 1gm IV q8h  Follow culture results and sensitivities  Follow renal function   Temp (24hrs), Avg:100 F (37.8 C), Min:100 F (37.8 C), Max:100 F (37.8 C)  Recent Labs  Lab 01/02/18 1413 01/02/18 1422  WBC 16.9*  --   CREATININE 1.03*  --   LATICACIDVEN  --  1.99*    Estimated Creatinine Clearance: 55.5 mL/min (A) (by C-G formula based on SCr of 1.03 mg/dL (H)).    Allergies  Allergen Reactions  . Contrast Media [Iodinated Diagnostic Agents] Hives    Spoke to patient, Iodine allergy is really IV contrast allergy.   . Dilaudid [Hydromorphone Hcl] Other (See Comments)    HEADACHE  . Novolog [Insulin Aspart] Shortness Of Breath    "breathing problems"  . Codeine Nausea And Vomiting    HIGH DOSES-SEVERE VOMITING  . Iodine Other (See Comments)    MUST HAVE BENADRYL PRIOR TO PROCEDURE AND RIGHT BEFORE TREATMENT TO COUNTERACT REACTION-BLISTERING REACTION DERMATOLOGICAL  . Penicillins Itching, Rash and Other (See Comments)    Has patient had a PCN reaction causing immediate rash, facial/tongue/throat swelling, SOB or lightheadedness with hypotension: no Has patient had a PCN reaction causing severe rash involving mucus membranes or skin necrosis: No Has patient had a PCN reaction that required hospitalization No Has patient had a PCN reaction occurring within the last 10 years: No If all of the above answers are "NO", then may proceed with Cephalosporin use.  CHEST SIZED RASH AND ITCHING   . Propofol Other (See Comments)   "Breathing problems - asthma attack" Can take with benadryl   . Ace Inhibitors Cough  . Demerol [Meperidine] Nausea And Vomiting  . Neosporin [Neomycin-Bacitracin Zn-Polymyx] Itching and Rash    MAKES REACTIONS WORSE WHEN USING AS PROPHYLACTIC  . Percocet [Oxycodone-Acetaminophen] Rash  . Tape Itching and Rash    /Antimicrobials this admission: 8/26 Zosyn x 1 dose  8/26 Cefepime >>   8/26 Vanc >>  Dose adjustments this admission:    Microbiology results: 8/26 BCx: sent  Thank you for allowing pharmacy to be a part of this patient's care.  Everette Rank, PharmD 01/02/2018 5:01 PM

## 2018-01-02 NOTE — Plan of Care (Signed)
  Problem: Activity: Goal: Risk for activity intolerance will decrease Outcome: Progressing   Problem: Pain Managment: Goal: General experience of comfort will improve Outcome: Progressing   

## 2018-01-02 NOTE — Telephone Encounter (Signed)
Dr. Jacqualyn Posey ordered pt to [facility] ED, for cellulitis of left leg, fever 100.3, nausea and vomiting. I called [phone] and was is instructed to call [phone].

## 2018-01-02 NOTE — ED Notes (Signed)
Coming from Dr Pasty Arch office, patient is needing IV antibiotics for lower extremity cellulitis

## 2018-01-02 NOTE — Telephone Encounter (Signed)
I informed [facility] of pt's status, Dr. Jacqualyn Posey wanted her on  IV antibiotics and she would be arriving by private vehicle.

## 2018-01-03 ENCOUNTER — Inpatient Hospital Stay (HOSPITAL_COMMUNITY): Payer: Medicare PPO

## 2018-01-03 DIAGNOSIS — I340 Nonrheumatic mitral (valve) insufficiency: Secondary | ICD-10-CM

## 2018-01-03 DIAGNOSIS — L03116 Cellulitis of left lower limb: Secondary | ICD-10-CM

## 2018-01-03 DIAGNOSIS — J9601 Acute respiratory failure with hypoxia: Secondary | ICD-10-CM

## 2018-01-03 DIAGNOSIS — E1165 Type 2 diabetes mellitus with hyperglycemia: Secondary | ICD-10-CM

## 2018-01-03 LAB — CBC
HCT: 28.1 % — ABNORMAL LOW (ref 36.0–46.0)
Hemoglobin: 9 g/dL — ABNORMAL LOW (ref 12.0–15.0)
MCH: 27.4 pg (ref 26.0–34.0)
MCHC: 32 g/dL (ref 30.0–36.0)
MCV: 85.4 fL (ref 78.0–100.0)
PLATELETS: 428 10*3/uL — AB (ref 150–400)
RBC: 3.29 MIL/uL — ABNORMAL LOW (ref 3.87–5.11)
RDW: 14.6 % (ref 11.5–15.5)
WBC: 15 10*3/uL — ABNORMAL HIGH (ref 4.0–10.5)

## 2018-01-03 LAB — BLOOD GAS, ARTERIAL
Acid-Base Excess: 0.6 mmol/L (ref 0.0–2.0)
Acid-base deficit: 2.3 mmol/L — ABNORMAL HIGH (ref 0.0–2.0)
Bicarbonate: 25.2 mmol/L (ref 20.0–28.0)
Bicarbonate: 25.6 mmol/L (ref 20.0–28.0)
Delivery systems: POSITIVE
Delivery systems: POSITIVE
Drawn by: 422461
Drawn by: 422461
Expiratory PAP: 8
Expiratory PAP: 8
FIO2: 35
FIO2: 50
Inspiratory PAP: 16
Inspiratory PAP: 18
Mode: POSITIVE
Mode: POSITIVE
O2 Saturation: 92.8 %
O2 Saturation: 94 %
Patient temperature: 97.7
Patient temperature: 97.7
RATE: 10 resp/min
RATE: 10 resp/min
pCO2 arterial: 44.4 mmHg (ref 32.0–48.0)
pCO2 arterial: 60 mmHg — ABNORMAL HIGH (ref 32.0–48.0)
pH, Arterial: 7.244 — ABNORMAL LOW (ref 7.350–7.450)
pH, Arterial: 7.376 (ref 7.350–7.450)
pO2, Arterial: 67.4 mmHg — ABNORMAL LOW (ref 83.0–108.0)
pO2, Arterial: 83.2 mmHg (ref 83.0–108.0)

## 2018-01-03 LAB — GLUCOSE, CAPILLARY
GLUCOSE-CAPILLARY: 183 mg/dL — AB (ref 70–99)
Glucose-Capillary: 213 mg/dL — ABNORMAL HIGH (ref 70–99)
Glucose-Capillary: 208 mg/dL — ABNORMAL HIGH (ref 70–99)
Glucose-Capillary: 202 mg/dL — ABNORMAL HIGH (ref 70–99)
Glucose-Capillary: 158 mg/dL — ABNORMAL HIGH (ref 70–99)

## 2018-01-03 LAB — ECHOCARDIOGRAM COMPLETE
HEIGHTINCHES: 64 in
WEIGHTICAEL: 2860.69 [oz_av]

## 2018-01-03 LAB — BASIC METABOLIC PANEL
Anion gap: 10 (ref 5–15)
BUN: 19 mg/dL (ref 8–23)
CALCIUM: 8.7 mg/dL — AB (ref 8.9–10.3)
CO2: 26 mmol/L (ref 22–32)
CREATININE: 0.99 mg/dL (ref 0.44–1.00)
Chloride: 97 mmol/L — ABNORMAL LOW (ref 98–111)
GFR calc Af Amer: >60 mL/min (ref >60)
GFR, EST NON AFRICAN AMERICAN: 59 mL/min — AB (ref >60)
GLUCOSE: 231 mg/dL — AB (ref 70–99)
Potassium: 3.7 mmol/L (ref 3.5–5.1)
Sodium: 133 mmol/L — ABNORMAL LOW (ref 135–145)

## 2018-01-03 LAB — HIV ANTIBODY (ROUTINE TESTING W REFLEX): HIV SCREEN 4TH GENERATION: NONREACTIVE

## 2018-01-03 LAB — D-DIMER, QUANTITATIVE (NOT AT ARMC): D DIMER QUANT: 2.66 ug{FEU}/mL — AB (ref 0.00–0.50)

## 2018-01-03 LAB — TROPONIN I: Troponin I: 0.03 ng/mL (ref <0.03)

## 2018-01-03 MED ORDER — MORPHINE SULFATE (PF) 2 MG/ML IV SOLN
INTRAVENOUS | Status: AC
  Filled 2018-01-03: qty 1

## 2018-01-03 MED ORDER — VANCOMYCIN HCL IN DEXTROSE 1-5 GM/200ML-% IV SOLN
1000.0000 mg | INTRAVENOUS | Status: DC
  Administered 2018-01-03 – 2018-01-06 (×4): 1000 mg via INTRAVENOUS
  Filled 2018-01-03 (×4): qty 200

## 2018-01-03 MED ORDER — NITROGLYCERIN 0.4 MG SL SUBL
SUBLINGUAL_TABLET | SUBLINGUAL | Status: AC
  Administered 2018-01-03: 0.4 mg
  Administered 2018-01-03 (×2)
  Filled 2018-01-03: qty 1

## 2018-01-03 MED ORDER — LORAZEPAM 2 MG/ML IJ SOLN
1.0000 mg | Freq: Once | INTRAMUSCULAR | Status: AC
  Administered 2018-01-03: 1 mg via INTRAVENOUS
  Filled 2018-01-03: qty 1

## 2018-01-03 MED ORDER — FUROSEMIDE 10 MG/ML IJ SOLN
40.0000 mg | Freq: Two times a day (BID) | INTRAMUSCULAR | Status: DC
  Administered 2018-01-03 – 2018-01-04 (×2): 40 mg via INTRAVENOUS
  Filled 2018-01-03 (×2): qty 4

## 2018-01-03 MED ORDER — FUROSEMIDE 10 MG/ML IJ SOLN
40.0000 mg | Freq: Once | INTRAMUSCULAR | Status: AC
  Administered 2018-01-03: 40 mg via INTRAVENOUS
  Filled 2018-01-03: qty 4

## 2018-01-03 MED ORDER — MORPHINE SULFATE (PF) 2 MG/ML IV SOLN
2.0000 mg | INTRAVENOUS | Status: DC | PRN
  Administered 2018-01-06: 2 mg via INTRAVENOUS
  Filled 2018-01-03: qty 1

## 2018-01-03 MED ORDER — LORAZEPAM 2 MG/ML IJ SOLN
1.0000 mg | Freq: Four times a day (QID) | INTRAMUSCULAR | Status: DC | PRN
  Administered 2018-01-03: 1 mg via INTRAVENOUS
  Filled 2018-01-03: qty 1

## 2018-01-03 MED ORDER — INSULIN LISPRO 100 UNIT/ML ~~LOC~~ SOLN: 0.0000 [IU] | Freq: Three times a day (TID) | SUBCUTANEOUS | Status: DC

## 2018-01-03 MED ORDER — INSULIN LISPRO 100 UNIT/ML ~~LOC~~ SOLN: 0.0000 [IU] | Freq: Every day | SUBCUTANEOUS | Status: DC

## 2018-01-03 MED ORDER — SODIUM CHLORIDE 0.9 % IV SOLN
2.0000 g | Freq: Two times a day (BID) | INTRAVENOUS | Status: DC
  Administered 2018-01-03 – 2018-01-09 (×12): 2 g via INTRAVENOUS
  Filled 2018-01-03 (×16): qty 2

## 2018-01-03 NOTE — Progress Notes (Addendum)
Patient ID: Judith [name], female   DOB: 11/03/1950, 67 y.o.   MRN: [id number] Patient is seen in follow-up for the left foot status post MRI scan.  From the plantar ulcer there is a small drop of purulence.  The ulcer does probe to bone and tendon.  Review of the MRI scan shows no definite osteomyelitis no definite abscess.  There is been no change in patient's cellulitis despite IV antibiotics.  Patient is also having some respiratory compromise at this time she is satting at 100% with nasal cannula FiO2 but she states she feels short of breath.  Will need to proceed with surgical debridement of the left foot ulcer.    Will post surgery for Friday.    Patient will be need to be transferred to [place] transfer.  Transfer could occur on Wednesday or Thursday.  Ideally patient's pulmonary status will improve prior to surgery.

## 2018-01-03 NOTE — H&P (View-Only) (Signed)
Patient ID: Judith [name], female   DOB: 01-05-1951, 67 y.o.   MRN: [id number] Patient is seen in follow-up for the left foot status post MRI scan.  From the plantar ulcer there is a small drop of purulence.  The ulcer does probe to bone and tendon.  Review of the MRI scan shows no definite osteomyelitis no definite abscess.  There is been no change in patient's cellulitis despite IV antibiotics.  Patient is also having some respiratory compromise at this time she is satting at 100% with nasal cannula FiO2 but she states she feels short of breath.  Will need to proceed with surgical debridement of the left foot ulcer.    Will post surgery for Friday.    Patient will be need to be transferred to [place] transfer.  Transfer could occur on Wednesday or Thursday.  Ideally patient's pulmonary status will improve prior to surgery.

## 2018-01-03 NOTE — Progress Notes (Addendum)
Pharmacy Antibiotic Note  Judith [name] is a 67 y.o. female admitted on 01/02/2018 with cellulitis of left foot. PMH significant for DM with neuropathy, CKD, HTN, PAF, and right foot amputation. Noted penicillin allergy; In the ED, patient received dose of Zosyn as well as a dose of Cefepime and appears to have tolerated. Pharmacy has been consulted for Vancomycin and Cefepime dosing.  8/27 MRI of left foot: c/w cellulitis, negative for osteomyelitis or septic joint; subcutaneous edema about toes worst in third toe, but no abscess identified    Plan:  Adjust Vancomycin maintenance dose to 1g IV q24h to keep AUC in goal range.  Plan for Vancomycin levels at steady state, as indicated.  Adjust Cefepime to 2g IV q12h.  Monitor renal function, cultures, clinical course.   Height: 5\' 4"  (162.6 cm) Weight: 178 lb 12.7 oz (81.1 kg) IBW/kg (Calculated) : 54.7  Temp (24hrs), Avg:99.2 F (37.3 C), Min:97.4 F (36.3 C), Max:101.3 F (38.5 C)  Recent Labs  Lab 01/02/18 1413 01/02/18 1422 01/03/18 0536  WBC 16.9*  --  15.0*  CREATININE 1.03*  --  0.99  LATICACIDVEN  --  1.99*  --     Estimated Creatinine Clearance: 58.4 mL/min (by C-G formula based on SCr of 0.99 mg/dL).    Allergies  Allergen Reactions  . Contrast Media [Iodinated Diagnostic Agents] Hives    Spoke to patient, Iodine allergy is really IV contrast allergy.   . Dilaudid [Hydromorphone Hcl] Other (See Comments)    HEADACHE  . Novolog [Insulin Aspart] Shortness Of Breath    "breathing problems"  . Codeine Nausea And Vomiting    HIGH DOSES-SEVERE VOMITING  . Iodine Other (See Comments)    MUST HAVE BENADRYL PRIOR TO PROCEDURE AND RIGHT BEFORE TREATMENT TO COUNTERACT REACTION-BLISTERING REACTION DERMATOLOGICAL  . Penicillins Itching, Rash and Other (See Comments)    Has patient had a PCN reaction causing immediate rash, facial/tongue/throat swelling, SOB or lightheadedness with hypotension: no Has patient had a PCN  reaction causing severe rash involving mucus membranes or skin necrosis: No Has patient had a PCN reaction that required hospitalization No Has patient had a PCN reaction occurring within the last 10 years: No If all of the above answers are "NO", then may proceed with Cephalosporin use.  CHEST SIZED RASH AND ITCHING   . Propofol Other (See Comments)    "Breathing problems - asthma attack" Can take with benadryl   . Ace Inhibitors Cough  . Demerol [Meperidine] Nausea And Vomiting  . Neosporin [Neomycin-Bacitracin Zn-Polymyx] Itching and Rash    MAKES REACTIONS WORSE WHEN USING AS PROPHYLACTIC  . Percocet [Oxycodone-Acetaminophen] Rash  . Tape Itching and Rash    Antimicrobials this admission: PTA Doxycycline, Cephalexin  8/26 Zosyn x 1 dose  8/26 Cefepime >>   8/26 Vanc >>  Microbiology results: 8/26 BCx: NGTD 8/27 HIV antibody: IP   Thank you for allowing pharmacy to be a part of this patient's care.   Lindell Spar, PharmD, BCPS Pager: [phone] 01/03/2018 11:47 AM

## 2018-01-03 NOTE — Consult Note (Signed)
ORTHOPAEDIC CONSULTATION  REQUESTING PHYSICIAN: Mariel Aloe, MD  Chief Complaint: Over 1 week history of cellulitis and swelling left foot.  HPI: Judith [name] is a 67 y.o. female who presents with diabetic insensate neuropathy with poor glucose control.  Patient states that she has been treated with conservative treatment with oral antibiotics and wound care for the left foot.  Despite good conservative care patient has developed progressive cellulitis and presents at the hospital for IV antibiotics and possible surgical intervention.  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  Past Medical History:  Diagnosis Date  . Abnormal EKG 07/31/2013  . Arthritis    "hands" (03/06/2015)  . Asthma   . Carpal tunnel syndrome, bilateral   . Chronic kidney disease   . Complication of anesthesia    slow to wake up  . Coronary artery disease     2 v CAD with CTO of the RCA and high grade bifurcational LCx/OM stenosis. S/P PCI DES x 2 to the LCx/OM.  Marland Kitchen Diabetic peripheral neuropathy ([place]) "since 1996"  . GERD (gastroesophageal reflux disease)   . Goiter   . Headache    migraines prior to menopause  . History of shingles 06/01/2013  . Hyperlipidemia LDL goal <70 10/13/2015  . Hypertension   . Hyperthyroidism   . PAF (paroxysmal atrial fibrillation) ([place]) 04/29/2015   CHADS2VASC score of 5 now on Apixaban  . Pneumonia ~ 1976  . Tremors of nervous system   . Type II diabetes mellitus (HCC)    insulin dependent   Past Surgical History:  Procedure Laterality Date  . ABDOMINAL HYSTERECTOMY  1988   age 63; CERVICAL DYSPLASIA; ovaries intact.   . AMPUTATION Right 01/23/2016   Procedure: Right 3rd Ray Amputation;  Surgeon: [name], MD;  Location: [facility];  Service: Orthopedics;  Laterality: Right;  . AMPUTATION Right 02/13/2016   Procedure: Right Transmetatarsal Amputation;  Surgeon: [name], MD;  Location: [facility];  Service: Orthopedics;  Laterality:  Right;  . CARDIAC CATHETERIZATION N/A 02/27/2015   Procedure: Left Heart Cath and Coronary Angiography;  Surgeon: [name], MD; LAD 40%, mCFX 80%, OM 70%, RCA 100% calcified       . CARDIAC CATHETERIZATION N/A 03/06/2015   Procedure: Coronary Stent Intervention;  Surgeon: [name], MD;  Location: [facility];  Service: Cardiovascular;  Laterality: N/A;  Mid CX 3.50x12 promus DES w/ 0% resdual and Prox OM1 2.50x20 promus DES w/ 20% residual  . CARDIOVERSION    . CARPAL TUNNEL RELEASE Right Nov 2015  . CARPAL TUNNEL RELEASE Right 1992; 05/2014   [name]; [place]  . CESAREAN SECTION  1982; 1984  . FOOT NEUROMA SURGERY Bilateral 2000  . KNEE ARTHROSCOPY Right ~ 2003   "meniscus repair"  . SHOULDER OPEN ROTATOR CUFF REPAIR Right 1996; 1998   "w/fracture repair"  . THYROID SURGERY  2000   "removed lots of nodules"  . TONSILLECTOMY  1976   Social History   Socioeconomic History  . Marital status: Single    Spouse name: Not on file  . Number of children: 2  . Years of education: Masters  . Highest education level: Not on file  Occupational History  . Occupation: Landscape architect  Social Needs  . Financial resource strain: Somewhat hard  . Food insecurity:    Worry: Sometimes true    Inability: Sometimes true  . Transportation needs:    Medical: No  Non-medical: No  Tobacco Use  . Smoking status: Former Smoker    Packs/day: 0.00    Years: 41.00    Pack years: 0.00    Types: Cigarettes    Last attempt to quit: 03/05/2015    Years since quitting: 2.8  . Smokeless tobacco: Never Used  . Tobacco comment: 04/29/2015 "quit smoking cigarettes 02/27/2015"  Substance and Sexual Activity  . Alcohol use: No  . Drug use: No  . Sexual activity: Not Currently    Birth control/protection: Post-menopausal, Surgical  Lifestyle  . Physical activity:    Days per week: 0 days    Minutes per session: 0 min  . Stress: Very much  Relationships  . Social  connections:    Talks on phone: More than three times a week    Gets together: More than three times a week    Attends religious service: More than 4 times per year    Active member of club or organization: No    Attends meetings of clubs or organizations: Never    Relationship status: Never married  Other Topics Concern  . 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      Lives with sister in [place].      Tobacco: 1 ppd x 41 years - quit 2016      Alcohol: none      Drugs: none      Exercise:  Walking in neighborhood; physical job.   Right-handed.   2 cups caffeine daily.   Family History  Problem Relation Age of Onset  . Cancer Mother 23       bronchial cancer  . Breast cancer Mother   . Lung cancer Mother   . Hypertension Father   . COPD Father   . Heart disease Father 97       CAD with cardiac stenting  . Heart attack Father   . Parkinson's disease Father   . Allergies Sister   . Breast cancer Maternal Grandmother   . Emphysema Maternal Grandfather   . Leukemia Paternal Grandmother   . Emphysema Paternal Grandfather   . Thyroid disease Neg Hx    - negative except otherwise stated in the family history section Allergies  Allergen Reactions  . Contrast Media [Iodinated Diagnostic Agents] Hives    Spoke to patient, Iodine allergy is really IV contrast allergy.   . Dilaudid [Hydromorphone Hcl] Other (See Comments)    HEADACHE  . Novolog [Insulin Aspart] Shortness Of Breath    "breathing problems"  . Codeine Nausea And Vomiting    HIGH DOSES-SEVERE VOMITING  . Iodine Other (See Comments)    MUST HAVE BENADRYL PRIOR TO PROCEDURE AND RIGHT BEFORE TREATMENT TO COUNTERACT REACTION-BLISTERING REACTION DERMATOLOGICAL  . Penicillins Itching, Rash  and Other (See Comments)    Has patient had a PCN reaction causing immediate rash, facial/tongue/throat swelling, SOB or lightheadedness with hypotension: no Has patient had a PCN reaction causing severe rash involving mucus membranes or skin necrosis: No Has patient had a PCN reaction that required hospitalization No Has patient had a PCN reaction occurring within the last 10 years: No If all of the above answers are "NO", then may proceed with Cephalosporin use.  CHEST SIZED RASH AND ITCHING   .  Propofol Other (See Comments)    "Breathing problems - asthma attack" Can take with benadryl   . Ace Inhibitors Cough  . Demerol [Meperidine] Nausea And Vomiting  . Neosporin [Neomycin-Bacitracin Zn-Polymyx] Itching and Rash    MAKES REACTIONS WORSE WHEN USING AS PROPHYLACTIC  . Percocet [Oxycodone-Acetaminophen] Rash  . Tape Itching and Rash   Prior to Admission medications   Medication Sig Start Date End Date Taking? Authorizing Provider  albuterol (PROVENTIL HFA) 108 (90 Base) MCG/ACT inhaler Inhale 1-2 puffs into the lungs every 6 (six) hours as needed for wheezing or shortness of breath. 11/07/15  Yes [name], MD  albuterol (PROVENTIL) (2.5 MG/3ML) 0.083% nebulizer solution Take 3 mLs (2.5 mg total) by nebulization every 6 (six) hours as needed for wheezing or shortness of breath. 08/31/17  Yes [name], PA-C  amLODipine (NORVASC) 10 MG tablet Take 1 tablet (10 mg total) by mouth daily. 08/22/17  Yes Turner, Eber Hong, MD  apixaban (ELIQUIS) 5 MG TABS tablet Take 1 tablet (5 mg total) by mouth 2 (two) times daily. 08/17/17  Yes Turner, Eber Hong, MD  atorvastatin (LIPITOR) 20 MG tablet Take 1 tablet (20 mg total) by mouth daily. 08/22/17  Yes Turner, Eber Hong, MD  buPROPion (WELLBUTRIN SR) 150 MG 12 hr tablet Take 150 mg by mouth 2 (two) times daily.   Yes [provider]  cephALEXin (KEFLEX) 500 MG capsule Take 2 capsules (1,000 mg total) by mouth 2 (two) times daily for 10  days. 12/31/17 01/10/18 Yes Emeterio Reeve, DO  doxycycline (VIBRA-TABS) 100 MG tablet Take 1 tablet (100 mg total) by mouth 2 (two) times daily for 10 days. 12/31/17 01/10/18 Yes Emeterio Reeve, DO  fenofibrate (TRICOR) 145 MG tablet Take 1 tablet (145 mg total) by mouth daily. 08/22/17 08/17/18 Yes Turner, Eber Hong, MD  FLUoxetine (PROZAC) 20 MG capsule Take 1 capsule (20 mg total) by mouth at bedtime. 08/31/17  Yes [name], PA-C  gabapentin (NEURONTIN) 300 MG capsule Take 3 capsules (900 mg total) by mouth 2 (two) times daily. TAKE 3 CAPSULES BY MOUTH EVERY  MORNING AND 3 CAPSULES AT BEDTIME Patient taking differently: Take 600 mg by mouth 2 (two) times daily.  08/31/17  Yes [name], PA-C  insulin degludec (TRESIBA FLEXTOUCH) 100 UNIT/ML SOPN FlexTouch Pen Inject 0.2 mLs (20 Units total) into the skin daily at 10 pm. 07/05/17  Yes Mikhail, Velta Addison, DO  insulin lispro (HUMALOG KWIKPEN) 100 UNIT/ML KiwkPen three times daily (just before meals) 17-22-12 units. Patient taking differently: Inject 12-22 Units into the skin See admin instructions. three times daily (just before meals) 17-22-12 units. 12/05/17  Yes Renato Shin, MD  magnesium oxide (MAG-OX) 400 (241.3 Mg) MG tablet Take 1 tablet (400 mg total) by mouth daily. 10/07/17  Yes [name], PA-C  metFORMIN (GLUCOPHAGE-XR) 500 MG 24 hr tablet Take 4 tablets (2,000 mg total) by mouth daily with breakfast. Patient taking differently: Take 1,000 mg by mouth 2 (two) times daily.  08/25/17  Yes Renato Shin, MD  methimazole (TAPAZOLE) 10 MG tablet Take 1 tablet (10 mg total) by mouth daily. 08/25/17  Yes Renato Shin, MD  metoprolol succinate (TOPROL-XL) 25 MG 24 hr tablet Take 1 tablet (25 mg total) by mouth 2 (two) times daily. Patient taking differently: Take 25 mg by mouth See admin instructions. Takes 1 tablet twice daily. Then 1 tablet daily as needed for a.fib 08/22/17  Yes Turner, Eber Hong, MD  nitroGLYCERIN (NITROSTAT) 0.4  MG  SL tablet Place 1 tablet (0.4 mg total) under the tongue every 5 (five) minutes as needed for chest pain. 02/10/17  Yes [name], PA-C  pantoprazole (PROTONIX) 40 MG tablet Take 1 tablet (40 mg total) by mouth daily. 08/31/17  Yes [name], PA-C  potassium chloride SA (K-DUR,KLOR-CON) 20 MEQ tablet Take 1 tablet (20 mEq total) by mouth 2 (two) times daily. 10/28/17  Yes Pattricia Boss, MD  silver sulfADIAZINE (SILVADENE) 1 % cream Apply 1 application topically daily. Patient taking differently: Apply 1 application topically daily as needed (for sores on feet).  07/01/17  Yes Trula Slade, DPM  sotalol (BETAPACE) 120 MG tablet Take 1 tablet (120 mg total) by mouth every 12 (twelve) hours. 08/22/17  Yes Turner, Eber Hong, MD  traMADol (ULTRAM) 50 MG tablet Take 1 tablet (50 mg total) by mouth every 8 (eight) hours as needed. Patient taking differently: Take 50 mg by mouth every 8 (eight) hours as needed for moderate pain.  08/31/17  Yes [name], PA-C  triamcinolone cream (KENALOG) 0.1 % Apply 1 application topically 2 (two) times daily. Patient taking differently: Apply 1 application topically 2 (two) times daily as needed (for rash).  10/07/17  Yes [name], PA-C  Alcohol Swabs (B-D SINGLE USE SWABS REGULAR) PADS 1 each by Does not apply route 2 (two) times daily. 08/29/17   Renato Shin, MD  Blood Glucose Calibration (TRUE METRIX LEVEL 1) Low SOLN 1 each by In Vitro route as needed. 08/25/17   Renato Shin, MD  Blood Glucose Monitoring Suppl (TRUE METRIX AIR GLUCOSE METER) w/Device KIT 1 each by Does not apply route daily. 08/25/17   Renato Shin, MD  buPROPion (WELLBUTRIN) 75 MG tablet Take 1 tablet (75 mg total) by mouth every 6 (six) hours. Patient not taking: Reported on 01/02/2018 10/07/17   Philis Fendt L, PA-C  glucose blood (TRUE METRIX BLOOD GLUCOSE TEST) test strip Used to check blood sugars 2x daily. 08/25/17   Renato Shin, MD  Insulin Pen Needle 32G X 4 MM MISC  Use to inject insulin daily 08/13/16   Renato Shin, MD  Insulin Syringe-Needle U-100 (INSULIN SYRINGE .5CC/31GX5/16") 31G X 5/16" 0.5 ML MISC Use to inject insulin 08/13/16   Renato Shin, MD  TRUEPLUS LANCETS 28G MISC 1 each by Does not apply route 2 (two) times daily. 08/29/17   Renato Shin, MD   Dg Foot Complete Left  Result Date: 01/02/2018 CLINICAL DATA:  Cellulitis. On antibiotics for 2 days, worsening symptoms. EXAM: LEFT FOOT - COMPLETE 3+ VIEW COMPARISON:  None. FINDINGS: No acute fracture deformity or dislocation. No destructive bony lesions. Moderate plantar calcaneal spur. Calcification at Achilles insertion seen with old injury. Dorsal foot soft tissue swelling with forefoot dorsal subcutaneous gas. IMPRESSION: 1. Soft tissue swelling with dorsal forefoot subcutaneous gas concerning for necrotizing fasciitis. 2. No acute osseous process. Acute findings discussed with and reconfirmed by PA.MICHAEL MACZIS on 01/02/2018 at 1:49 pm. Electronically Signed   By: Elon Alas M.D.   On: 01/02/2018 13:49   - pertinent xrays, CT, MRI studies were reviewed and independently interpreted  Positive ROS: All other systems have been reviewed and were otherwise negative with the exception of those mentioned in the HPI and as above.  Physical Exam: General: Alert, no acute distress Psychiatric: Patient is competent for consent with normal mood and affect Lymphatic: No axillary or cervical lymphadenopathy Cardiovascular: No pedal edema Respiratory: No cyanosis, no use of accessory musculature GI:  No organomegaly, abdomen is soft and non-tender    Images:  _0 @  Labs:  Lab Results  Component Value Date   HGBA1C 8.7 (A) 12/05/2017   HGBA1C 7.9 (H) 08/06/2017   HGBA1C 9.2 (H) 07/04/2017   ESRSEDRATE 133 (H) 01/02/2018   CRP 23.5 (H) 01/02/2018   REPTSTATUS 02/14/2015 FINAL 02/12/2015   CULT  02/12/2015    >=100,000 COLONIES/mL GROUP B STREP(S.AGALACTIAE)ISOLATED TESTING  AGAINST S. AGALACTIAE NOT ROUTINELY PERFORMED DUE TO PREDICTABILITY OF AMP/PEN/VAN SUSCEPTIBILITY.    LABORGA Multiple bacterial morphotypes present, none 12/06/2014   LABORGA predominant. Suggest appropriate recollection if  12/06/2014   LABORGA clinically indicated. 12/06/2014    Lab Results  Component Value Date   ALBUMIN 4.2 09/19/2017   ALBUMIN 4.0 07/22/2017   ALBUMIN 4.0 09/24/2016    Neurologic: Patient does not have protective sensation bilateral lower extremities.   MUSCULOSKELETAL:   Skin: Examination patient has a palpable dorsalis pedis and posterior tibial pulse she does have swelling.  There is an area that shows the outline of the cellulitis and observation today shows regression of the cellulitis with some wrinkling of the skin with improvement of her infection.  The plantar ulcer does probe to the third and fourth metatarsal heads but there is no abscess.  Patient has a Waggoner grade 3 ulceration.  Radiographs shows no destructive bony changes.  Assessment: Assessment: Diabetic insensate neuropathy with Waggoner grade 3 ulcer with exposed third and fourth metatarsal heads with improvement of her cellulitis on IV antibiotics.  Plan: Plan: I have ordered an MRI scan to see if there is osteomyelitis of the third and fourth metatarsals.  If the MRI scan is normal would recommend 6 weeks of IV antibiotics otherwise would need to consider a transmetatarsal amputation.  Thank you for the consult and the opportunity to see Ms. Childress, MD [place] [phone] 7:30 AM

## 2018-01-03 NOTE — Progress Notes (Signed)
Pt went down for MRI this a.m. around 0830.  When patient returned at 0855, she stated that she was having chest pain.  VS and EKG were obtained, oxygen applied.  Pt was given 3 doses of nitroglycerin with minimal relief.  Patient's MD was at bedside by the second dose. Patient developed respiratory distress and respiratory therapy was called to bedside.  Patients sat remained low despite oxygen and breathing treatment.  HLNC at 12 lpm was started. RN instructed to also give the patient Morphine 2mg  and Ativan, as well. Sat increased and ranged from 88-95.  Order to move patient to stepdown.  Report called to Raven, RN and pt was transported via bed to the ICU/SD department.

## 2018-01-03 NOTE — Progress Notes (Addendum)
Shift event note: Notified just after shift change by "Raven", RN. (Day RN). Reported pt very agitated, trying to get out of bed. Increased WOB noted and dropped sats briefly to 75-80's and pt placed on NRB at 15L. MD had been consulted just prior to shift change and orders were rec'd for Lasix and CXR. IV Ativan given per scheduled PRN order. Re-paged approximately 30-45 min later. Now pt is on BiPAP, BP and sats as well as other VS have improved but now pt not responding well. At bedside pt noted resting in NAD on BIPAP, BBS noted w/ upper airway expiratory wheezes but minimal to know response to deep sternal rub. Skin cool and moist from recent diaphoresis which has resolved. ABG obtained > 9.24/60/83.2/25.2. Earlier CXR w/ findings c/w pulmonary edema. Pt  Has diuresed approx 500 cc since IV Lasix.  Assessment/Plan: 1. Acute on chronic hypoxic/hypercarbic respiratory failure: In setting of pt w/ h/o CHF, asthma (+/- COPD as pt smoker for 40+ yrs and family confirms pt still smokes). Somnolence likely IV Ativan and several days of no sleep per sister. Slightly acidotic and hypercarbic.  Discussed pt w/ Dr Oletta Darter w/ [facility] who recommends continuing BiPAP for now and repeat ABG in 2-3 hours. Agrees somnolence likely a result of IV Ativan. Recommended d/c Ativan and use low dose Haldol instead if pt becomes agitated again. Also recommended starting pt on low dose Seroquel when off BiPAP and taking PO's again. Order placed for repeat ABG. Pt's daughter "Trulee" and sister "[name]" who is also HPOA at bedside. Discussed pt's status at length. Family relate issues they are having properly caring for her because she is non-compliant at times. Family appreciated time spent at bedside and are in agreement w/ tonight's plan. Will follow next ABG. If improved will continue BiPAP until am. To continue close monitoring  in SDU.  Jeryl Columbia, NP-C [facility] Pager [phone]  CRITICAL CARE Performed by:  Jeryl Columbia   Total critical care time: 120 minutes  Critical care time was exclusive of separately billable procedures and treating other patients.  Critical care was necessary to treat or prevent imminent or life-threatening deterioration.  Critical care was time spent personally by me on the following activities: development of treatment plan with patient and/or surrogate as well as nursing, discussions with consultants, evaluation of patient's response to treatment, examination of patient, obtaining history from patient or surrogate, ordering and performing treatments and interventions, ordering and review of laboratory studies, ordering and review of radiographic studies, pulse oximetry and re-evaluation of patient's condition.   2350: F/u at bedside. Pt continues to rest in NAD. RRT reports pt more awake and conversing. ABG improved. Will continue BiPAP until am.

## 2018-01-03 NOTE — Progress Notes (Signed)
Called by RN making this RT aware that pts. oxygen saturation was 85% while on CPAP w/o oxygen while on room air, RT placed oxygen adapter inline with oxygen set at 3 lpm with current saturations reading 93%, pt. has requested X2 prn  Albuterol breathing tx.'s this shift with RT noting few crackles on R side while pt. lying R lateral recumbent, staff to monitor.

## 2018-01-03 NOTE — Progress Notes (Signed)
NP called due to patient not responding to painful stimuli and change of status. Pt. on BiPaP. NP now at bedside. Dauughter and sister at bedside. NP answering questions and educating family based on new status of patient.  Blood gas taken. New orders given.  ** Pts has allergy to insulin novolog. Verbal orders to not give any oral meds and to hold insulin novolog tonight based on pt. On BIPAP and mentality.   Will continue to monitor.

## 2018-01-03 NOTE — Progress Notes (Signed)
Pt. Now arrousal to sternal rub.

## 2018-01-03 NOTE — Progress Notes (Signed)
  Echocardiogram 2D Echocardiogram has been performed.  Judith [name] 01/03/2018, 2:44 PM

## 2018-01-03 NOTE — Progress Notes (Signed)
PROGRESS NOTE    Judith [name]  OZH:[id number] DOB: 1951/03/30 DOA: 01/02/2018 PCP: [name], PA-C   Brief Narrative: Judith [name] is a 67 y.o. emale with medical history of IDDM with neuropathy, CKD 3, HTN, HLD, PAF, right foot amputation. She presented secondary to redness in her left foot and found to have cellulitis. Concern for osteomyelitis. Orthopedic surgery consulted.   Assessment & Plan:   Principal Problem:   Cellulitis Active Problems:   GERD (gastroesophageal reflux disease)   Hyperthyroidism   Obstructive sleep apnea   S/P transmetatarsal amputation of foot, right (HCC)   Type II diabetes mellitus, uncontrolled ([place])   Peripheral neuropathy   Cellulitis Initial concern for osteomyelitis. Gas seen on x-ray and confirmed on MRI. Orthopedic surgery consulted and antibiotics started -Continue Vancomycin/cefepime -Orthopedic surgery recommendations  Acute respiratory failure with hypoxia Chest x-ray obtained and significant for pulmonary edema. Unknown etiology. Requiring increasing amount of oxygen. Currently requiring HFNC -Lasix x1 -Transthoracic Echocardiogram -Oxygen as needed to keep O2 saturation >92%; wean as able  Chest pain In setting of anxiety and dyspnea. EKG unremarkable for acute ST-T changes. -Cycle troponin -Morphine prn -Nitro prn  Obstructive sleep apnea -Bipap qhs  Diabetes mellitus, type 2 Diabetic neuropathy On insulin therapy. --Continue Lantus 20 units and SSI  Hyperthyroidism -Continue methimazole  Depression -Continue Prozac  Essential hypertension -Continue amlodipine  Atrial fibrillation -Continue metoprolol/satolol -Continue Eliquis  CAD Patient with chest pain. -management above   DVT prophylaxis: Eliquis Code Status:   Code Status: Full Code Family Communication: None at bedside Disposition Plan: Transfer to stepdown   Consultants:   Orthopedic surgery  Procedures:    None  Antimicrobials:  Vancomycin  Cefepime    Subjective: Dyspnea and chest pain.  Objective: Vitals:   01/03/18 1027 01/03/18 1029 01/03/18 1105 01/03/18 1115  BP:  (!) 175/58 (!) 158/74   Pulse:  73 67 68  Resp:   18 (!) 23  Temp:   (!) 97.4 F (36.3 C)   TempSrc:      SpO2: 95%  90% 100%  Weight:      Height:        Intake/Output Summary (Last 24 hours) at 01/03/2018 1332 Last data filed at 01/03/2018 0615 Gross per 24 hour  Intake 1478.86 ml  Output -  Net 1478.86 ml   Filed Weights   01/02/18 1727  Weight: 81.1 kg    Examination:  General exam: Appears calm and comfortable Respiratory system: Diminished bilaterally. Increased respiratory effort. Cardiovascular system: S1 & S2 heard, RRR. No murmurs. Gastrointestinal system: Abdomen is nondistended, soft and nontender. No organomegaly or masses felt. Normal bowel sounds heard. Central nervous system: Alert and oriented. No focal neurological deficits. Extremities: No edema. No calf tenderness Skin: No cyanosis. Left foot with erythema on dorsum. Ulcer on plantar surface  Psychiatry: Judgement and insight appear normal. Mood & affect appropriate.     Data Reviewed: I have personally reviewed following labs and imaging studies  CBC: Recent Labs  Lab 01/02/18 1413 01/03/18 0536  WBC 16.9* 15.0*  NEUTROABS 14.5*  --   HGB 9.7* 9.0*  HCT 29.8* 28.1*  MCV 83.7 85.4  PLT 452* 629*   Basic Metabolic Panel: Recent Labs  Lab 01/02/18 1413 01/03/18 0536  NA 135 133*  K 3.5 3.7  CL 93* 97*  CO2 28 26  GLUCOSE 204* 231*  BUN 21 19  CREATININE 1.03* 0.99  CALCIUM 9.3 8.7*   GFR: Estimated Creatinine Clearance: 58.4 mL/min (by  C-G formula based on SCr of 0.99 mg/dL). Liver Function Tests: No results for input(s): AST, ALT, ALKPHOS, BILITOT, PROT, ALBUMIN in the last 168 hours. No results for input(s): LIPASE, AMYLASE in the last 168 hours. No results for input(s): AMMONIA in the last 168  hours. Coagulation Profile: No results for input(s): INR, PROTIME in the last 168 hours. Cardiac Enzymes: Recent Labs  Lab 01/03/18 0942  TROPONINI 0.03*   BNP (last 3 results) No results for input(s): PROBNP in the last 8760 hours. HbA1C: No results for input(s): HGBA1C in the last 72 hours. CBG: Recent Labs  Lab 01/02/18 1736 01/02/18 2021 01/03/18 0740 01/03/18 1021 01/03/18 1117  GLUCAP 160* 222* 213* 208* 202*   Lipid Profile: No results for input(s): CHOL, HDL, LDLCALC, TRIG, CHOLHDL, LDLDIRECT in the last 72 hours. Thyroid Function Tests: No results for input(s): TSH, T4TOTAL, FREET4, T3FREE, THYROIDAB in the last 72 hours. Anemia Panel: No results for input(s): VITAMINB12, FOLATE, FERRITIN, TIBC, IRON, RETICCTPCT in the last 72 hours. Sepsis Labs: Recent Labs  Lab 01/02/18 1422  LATICACIDVEN 1.99*    Recent Results (from the past 240 hour(s))  WOUND CULTURE     Status: Abnormal (Preliminary result)   Collection Time: 12/31/17  4:40 PM  Result Value Ref Range Status   Gram Stain Result WILL FOLLOW  Preliminary   Aerobic Bacterial Culture Preliminary report (A)  Preliminary   Organism ID, Bacteria Staphylococcus aureus (A)  Preliminary    Comment: Heavy growth   Organism ID, Bacteria Gram negative rods (A)  Preliminary    Comment: Light growth   Organism ID, Bacteria Mixed skin flora  Preliminary    Comment: Moderate growth  Culture, blood (routine x 2)     Status: None (Preliminary result)   Collection Time: 01/02/18  2:13 PM  Result Value Ref Range Status   Specimen Description   Final    BLOOD RIGHT WRIST Performed at [facility] [address] [place], [place] [address]    Special Requests   Final    BOTTLES DRAWN AEROBIC AND ANAEROBIC Blood Culture adequate volume Performed at [facility] [address] [place], [place] [address]    Culture   Final    NO GROWTH < 24 HOURS Performed at [facility] [address], [place], [place] [address]    Report Status PENDING  Incomplete  Culture, blood (routine x 2)     Status: None (Preliminary result)   Collection Time: 01/02/18  2:15 PM  Result Value Ref Range Status   Specimen Description   Final    BLOOD LEFT HAND Performed at [facility] [address], [place], [place] [address]    Special Requests   Final    BOTTLES DRAWN AEROBIC AND ANAEROBIC Blood Culture adequate volume Performed at [facility] [address], [place], [place] [address]    Culture   Final    NO GROWTH < 24 HOURS Performed at [facility] [address], [place],  [address]    Report Status PENDING  Incomplete         Radiology Studies: Mr Foot Left Wo Contrast  Result Date: 01/03/2018 CLINICAL DATA:  Left foot pain and swelling in a diabetic patient. Failure to respond to antibiotic treatment. EXAM: MRI OF THE LEFT FOOT WITHOUT CONTRAST TECHNIQUE: Multiplanar, multisequence MR imaging of the left foot was performed. No intravenous contrast was administered. COMPARISON:  Plain films  left foot 01/03/2018. FINDINGS: Bones/Joint/Cartilage Bone marrow signal is normal without evidence of osteomyelitis, fracture or other focal lesion. Ligaments Intact. Muscles and Tendons Intermediate increased T2 signal and mild fatty atrophy of intrinsic musculature of the foot are consistent with diabetic myopathy. Soft tissues Subcutaneous edema is present over the dorsum of the foot with scattered locules of gas present, most notable over the third MTP joint. Subcutaneous edema about the toes is worst in the third toe. No notable joint effusion is identified. IMPRESSION: Extensive subcutaneous soft tissue edema in the dorsum of the foot with locules soft tissue gas as seen on plain films consistent with cellulitis. Negative for osteomyelitis or septic joint. Subcutaneous edema about the toes is worst in the third toe  but no abscess is identified on this noncontrast exam. Electronically Signed   By: Inge Rise M.D.   On: 01/03/2018 10:21   Dg Chest Port 1 View  Result Date: 01/03/2018 CLINICAL DATA:  Chest pain EXAM: PORTABLE CHEST 1 VIEW COMPARISON:  11/04/2017 FINDINGS: Interstitial coarsening with Kerley lines. Chronic cardiomegaly. No air bronchogram, effusion, or pneumothorax. IMPRESSION: CHF pattern. Electronically Signed   By: Monte Fantasia M.D.   On: 01/03/2018 09:49   Dg Foot Complete Left  Result Date: 01/02/2018 CLINICAL DATA:  Cellulitis. On antibiotics for 2 days, worsening symptoms. EXAM: LEFT FOOT - COMPLETE 3+ VIEW COMPARISON:  None. FINDINGS: No acute fracture deformity or dislocation. No destructive bony lesions. Moderate plantar calcaneal spur. Calcification at Achilles insertion seen with old injury. Dorsal foot soft tissue swelling with forefoot dorsal subcutaneous gas. IMPRESSION: 1. Soft tissue swelling with dorsal forefoot subcutaneous gas concerning for necrotizing fasciitis. 2. No acute osseous process. Acute findings discussed with and reconfirmed by PA.MICHAEL MACZIS on 01/02/2018 at 1:49 pm. Electronically Signed   By: Elon Alas M.D.   On: 01/02/2018 13:49        Scheduled Meds: . amLODipine  10 mg Oral Daily  . apixaban  5 mg Oral BID  . atorvastatin  20 mg Oral QPM  . buPROPion  150 mg Oral BID  . fenofibrate  160 mg Oral Daily  . FLUoxetine  20 mg Oral QHS  . gabapentin  600 mg Oral BID  . insulin aspart  0-5 Units Subcutaneous QHS  . insulin aspart  0-9 Units Subcutaneous TID WC  . insulin glargine  20 Units Subcutaneous QHS  . magnesium oxide  400 mg Oral Daily  . methimazole  10 mg Oral Daily  . metoprolol succinate  25 mg Oral BID  . pantoprazole  40 mg Oral Daily  . potassium chloride SA  20 mEq Oral BID  . sodium chloride flush  3 mL Intravenous Q12H  . sotalol  120 mg Oral Q12H   Continuous Infusions: . sodium chloride    . ceFEPime  (MAXIPIME) IV    . vancomycin       LOS: 1 day     Cordelia Poche, MD [facility] 01/03/2018, 1:32 PM Pager: [phone]  If 7PM-7AM, please contact night-coverage [email or web address] 01/03/2018, 1:32 PM

## 2018-01-03 NOTE — Progress Notes (Signed)
Pt. Having increasing work of breathing, tachypneic, short of breath and complaining that she can't breathe. Talked to MD ativan ordered and Lasix. RT Consulted when O2 sats in 75-80's and pt. Placed on 15 L nonrebreather w/ HFLNC @15  RT consulted. BP elevated and consulted MD again and advised to use BIPAP.

## 2018-01-04 ENCOUNTER — Ambulatory Visit (INDEPENDENT_AMBULATORY_CARE_PROVIDER_SITE_OTHER): Payer: Self-pay | Admitting: Physician Assistant

## 2018-01-04 ENCOUNTER — Inpatient Hospital Stay (HOSPITAL_COMMUNITY): Payer: Medicare PPO

## 2018-01-04 DIAGNOSIS — J9601 Acute respiratory failure with hypoxia: Secondary | ICD-10-CM

## 2018-01-04 DIAGNOSIS — J9600 Acute respiratory failure, unspecified whether with hypoxia or hypercapnia: Secondary | ICD-10-CM

## 2018-01-04 LAB — WOUND CULTURE

## 2018-01-04 LAB — BASIC METABOLIC PANEL
Anion gap: 11 (ref 5–15)
BUN: 26 mg/dL — AB (ref 8–23)
CALCIUM: 9.1 mg/dL (ref 8.9–10.3)
CHLORIDE: 99 mmol/L (ref 98–111)
CO2: 28 mmol/L (ref 22–32)
CREATININE: 1.01 mg/dL — AB (ref 0.44–1.00)
GFR calc non Af Amer: 57 mL/min — ABNORMAL LOW (ref >60)
Glucose, Bld: 167 mg/dL — ABNORMAL HIGH (ref 70–99)
Potassium: 4.1 mmol/L (ref 3.5–5.1)
Sodium: 138 mmol/L (ref 135–145)

## 2018-01-04 LAB — CBC
HEMATOCRIT: 28.7 % — AB (ref 36.0–46.0)
HEMOGLOBIN: 9.2 g/dL — AB (ref 12.0–15.0)
MCH: 27.3 pg (ref 26.0–34.0)
MCHC: 32.1 g/dL (ref 30.0–36.0)
MCV: 85.2 fL (ref 78.0–100.0)
Platelets: 499 10*3/uL — ABNORMAL HIGH (ref 150–400)
RBC: 3.37 MIL/uL — ABNORMAL LOW (ref 3.87–5.11)
RDW: 14.6 % (ref 11.5–15.5)
WBC: 14.2 10*3/uL — ABNORMAL HIGH (ref 4.0–10.5)

## 2018-01-04 LAB — URINALYSIS, ROUTINE W REFLEX MICROSCOPIC
Bilirubin Urine: NEGATIVE
Glucose, UA: 50 mg/dL — AB
Ketones, ur: 5 mg/dL — AB
Leukocytes, UA: NEGATIVE
Nitrite: NEGATIVE
Protein, ur: 100 mg/dL — AB
Specific Gravity, Urine: 1.025 (ref 1.005–1.030)
pH: 5 (ref 5.0–8.0)

## 2018-01-04 LAB — URINALYSIS, MICROSCOPIC (REFLEX)

## 2018-01-04 LAB — BLOOD GAS, ARTERIAL
Acid-Base Excess: 2 mmol/L (ref 0.0–2.0)
BICARBONATE: 25.9 mmol/L (ref 20.0–28.0)
DELIVERY SYSTEMS: POSITIVE
DRAWN BY: 441261
Expiratory PAP: 8
FIO2: 35
Inspiratory PAP: 18
LHR: 10 {breaths}/min
O2 Saturation: 98.5 %
Patient temperature: 98.6
pCO2 arterial: 39.7 mmHg (ref 32.0–48.0)
pH, Arterial: 7.431 (ref 7.350–7.450)
pO2, Arterial: 122 mmHg — ABNORMAL HIGH (ref 83.0–108.0)

## 2018-01-04 LAB — GLUCOSE, CAPILLARY
GLUCOSE-CAPILLARY: 118 mg/dL — AB (ref 70–99)
GLUCOSE-CAPILLARY: 107 mg/dL — AB (ref 70–99)
GLUCOSE-CAPILLARY: 148 mg/dL — AB (ref 70–99)
Glucose-Capillary: 184 mg/dL — ABNORMAL HIGH (ref 70–99)

## 2018-01-04 MED ORDER — SODIUM CHLORIDE 0.9 % IV BOLUS
250.0000 mL | Freq: Once | INTRAVENOUS | Status: AC
  Administered 2018-01-04: 250 mL via INTRAVENOUS

## 2018-01-04 MED ORDER — FUROSEMIDE 10 MG/ML IJ SOLN
40.0000 mg | Freq: Three times a day (TID) | INTRAMUSCULAR | Status: DC
  Administered 2018-01-04 – 2018-01-05 (×3): 40 mg via INTRAVENOUS
  Filled 2018-01-04 (×3): qty 4

## 2018-01-04 MED ORDER — CHLORHEXIDINE GLUCONATE 0.12 % MT SOLN
15.0000 mL | Freq: Two times a day (BID) | OROMUCOSAL | Status: DC
  Administered 2018-01-04 – 2018-01-10 (×11): 15 mL via OROMUCOSAL
  Filled 2018-01-04 (×12): qty 15

## 2018-01-04 MED ORDER — INSULIN LISPRO 100 UNIT/ML (KWIKPEN)
0.0000 [IU] | PEN_INJECTOR | Freq: Three times a day (TID) | SUBCUTANEOUS | Status: DC
  Administered 2018-01-04: 1 [IU] via SUBCUTANEOUS
  Administered 2018-01-05: 5 [IU] via SUBCUTANEOUS
  Administered 2018-01-05 (×2): 1 [IU] via SUBCUTANEOUS
  Administered 2018-01-07 – 2018-01-08 (×5): 3 [IU] via SUBCUTANEOUS
  Administered 2018-01-08: 2 [IU] via SUBCUTANEOUS
  Administered 2018-01-09: 1 [IU] via SUBCUTANEOUS
  Administered 2018-01-09: 2 [IU] via SUBCUTANEOUS
  Administered 2018-01-09 – 2018-01-10 (×2): 3 [IU] via SUBCUTANEOUS
  Administered 2018-01-10 – 2018-01-11 (×2): 2 [IU] via SUBCUTANEOUS
  Administered 2018-01-11: 1 [IU] via SUBCUTANEOUS

## 2018-01-04 MED ORDER — ORAL CARE MOUTH RINSE
15.0000 mL | Freq: Two times a day (BID) | OROMUCOSAL | Status: DC
  Administered 2018-01-04 – 2018-01-10 (×3): 15 mL via OROMUCOSAL

## 2018-01-04 MED ORDER — INSULIN LISPRO 100 UNIT/ML (KWIKPEN)
0.0000 [IU] | PEN_INJECTOR | Freq: Every day | SUBCUTANEOUS | Status: DC
  Administered 2018-01-07: 4 [IU] via SUBCUTANEOUS
  Administered 2018-01-08: 2 [IU] via SUBCUTANEOUS
  Administered 2018-01-09: 3 [IU] via SUBCUTANEOUS
  Administered 2018-01-10: 2 [IU] via SUBCUTANEOUS

## 2018-01-04 NOTE — Progress Notes (Signed)
[facility] PROGRESS NOTE  Judith [name] PPI:[id number] DOB: 1950/09/24 DOA: 01/02/2018 PCP: Hillis Range   Narrative: 67 year old female insulin-dependent diabetes (allergic to some types of insulin?)  Complicated by diabetic polyneuropathy, chronic kidney disease stage III, HTN, HLD, paroxysmal atrial fibrillation on Eliquis with chads score >3 post multiple DCCV in the past followed for A. fib RVR--on sotalol in addition to hospitalization 08/2017 where she was in A. fib RVR placed on IV amiodarone  That hospitalization was complicated by lower GI bleed she received 1 unit of PRBC amiodarone was discontinued and sotalol was increased-she was also discontinued off of Plavix and only kept on Eliquis because of the same  CAD status post two-vessel disease + PCI DES, reflux, goiter on Tapazole, right foot amputation 2017 Judith [name], obstructive sleep apnea on BiPAP nightly, morbid obesity Admitted 01/02/2018 fever found to have cough in addition to anorexia and increased swelling of the foot which started draining clear liquid  Orthopedics Judith [name] consulted-antibiotics changed from home medications of doxycycline to vancomycin/cefepime Hospitalization comp gated by acute respiratory failure with hypoxia secondary to possible pulmonary edema and required high flow nasal cannula overnight 8/27/8/28 placed on BiPAP   A & Plan Acute hypoxic respiratory failure-overnight 8/28 placed on BiPAP-Lasix extra dose [name]-see progress notes for details-ABG reassuring-down titrating oxygen and then settings off of BiPAP y note that on last discharge is 78.7 kg and here she is 35 Sister relates patient is Lasix nave does not take diuretics at home  Permanent A. fib status post DCCV multiple times maintained on sotalol, [place] 5-on apixaban as well-keep on telemetry, monitor magnesium check labs a.m. Office visit 12/12/2017 Judith [name] size at that time 68 it was felt that this was a substrate for her  A. Fib Appears that with initiation of BiPAP her A. fib has been "much better" and she was not interested at that visit and medication changed to Tikosyn Currently the patient is sinus rhythm we will follow along  Sepsis secondary to multiple causes-favor cellulitis >pneumonia as chest x-ray will not be clear if there is pulmonary edema-continue empiric vancomycin and cefepime-defer to surgeon further planning Note CRP ESR elevated-left foot is warm to touch but there does not seem to be any bony protrusion or depth to her cellulitis-nursing to mark out the area  Insulin-dependent diabetes mellitus-C8.77 2919-current management sliding scale and Lantus 20 units-blood sugar ranges 167-183  Diabetic nephropathy stage III-keep volume even at this time-resuming later on this admission metformin, she follows with Judith [name] as an outpatient and has been maintained  History CAD-  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-unclear when last TSH was done-has been maintained on current dose of Tapazole 10 daily for a long time according to family  Prior GI bleed 08/2017-now off of Plavix continue Eliquis-hemoglobin stable  Polar-continue Prozac 20 at bedtime, Wellbutrin 150 twice daily-initial inciting event was possibly anxiety-recommending Haldol if further recurrence   Patient on systemic anticoagulation, full code, discussed with family at the bedside in detail, stepdown status  Judith Wise, MD  [facility] Direct contact: [phone] --Via amion app OR  --[email or web address]; password [email or web address]  7PM-7AM contact night coverage as above 01/04/2018, 7:14 AM  LOS: 2 days   Consultants:  Orthopedics  Procedures:  No  Antimicrobials:  vank cefepime  Interval history/Subjective: Overnight noted events Alert awake trying to talk through the mask No chest pain, fever, chills, melena, dysuria Foot is warm to touch Prior to this visit her sister tells me she has not been  coughing up any sputum and is not been  around anyone sick  Objective:  Vitals:  Vitals:   01/04/18 0400 01/04/18 0500  BP: (!) 141/56 (!) 146/48  Pulse: 63 64  Resp: 20 18  Temp: 98.3 F (36.8 C)   SpO2: 98% 100%    Exam:  . Awake on BiPAP-settings 18/8 . EOMI NCAT . Cannot assess lung sounds [name] on BiPAP . S1-S2 but poor exam because on BiPAP . Abdomen is soft . Left lower extremity red halfway up the shin, right lower extremity Lisfranc amputation without any purulence or exudate . Sensory intact . Follows commands   I have personally reviewed the following:   Labs:  BUN/creatinine 26/1.01, troponin 0 0.03 white count down from 16.9-14.2, platelet 499  ABG from this morning 7.43/39.7/122  Imaging studies: CXR from earlier this morning reassuring-my personal overview of the same shows improvement from portable x-ray yesterday with less effusions  Medical tests:  Reviewed  Test discussed with performing physician:  No  Decision to obtain old records:  No  Review and summation of old records:  Yes  Scheduled Meds: . amLODipine  10 mg Oral Daily  . apixaban  5 mg Oral BID  . atorvastatin  20 mg Oral QPM  . buPROPion  150 mg Oral BID  . chlorhexidine  15 mL Mouth Rinse BID  . fenofibrate  160 mg Oral Daily  . FLUoxetine  20 mg Oral QHS  . furosemide  40 mg Intravenous BID  . gabapentin  600 mg Oral BID  . insulin glargine  20 Units Subcutaneous QHS  . insulin lispro  0-5 Units Subcutaneous QHS  . insulin lispro  0-9 Units Subcutaneous TID WC  . magnesium oxide  400 mg Oral Daily  . mouth rinse  15 mL Mouth Rinse q12n4p  . methimazole  10 mg Oral Daily  . metoprolol succinate  25 mg Oral BID  . pantoprazole  40 mg Oral Daily  . potassium chloride SA  20 mEq Oral BID  . sodium chloride flush  3 mL Intravenous Q12H  . sotalol  120 mg Oral Q12H   Continuous Infusions: . sodium chloride    . ceFEPime (MAXIPIME) IV Stopped (01/04/18 3220)  . vancomycin Stopped (01/03/18 1942)     Principal Problem:   Cellulitis Active Problems:   GERD (gastroesophageal reflux disease)   Hyperthyroidism   Obstructive sleep apnea   S/P transmetatarsal amputation of foot, right (HCC)   Type II diabetes mellitus, uncontrolled ([place])   Peripheral neuropathy   Acute respiratory failure with hypoxia ([place])   LOS: 2 days

## 2018-01-04 NOTE — Progress Notes (Signed)
Agree with plans as per overnight NP- Repeat blood gas stat, get stat chest x-ray, will consult pulmonology non-emergently with these results-relabel meds for later on today and give orally-still on high settings 18/8 and will need to significantly improve in terms of work of breathing prior to coming off machine  Verneita Griffes, MD [facility] 7:44 AM

## 2018-01-04 NOTE — Progress Notes (Addendum)
CRITICAL VALUE ALERT  Critical Value:  Temp 101.1  Date & Time Notied:  01-04-18-2100  Provider Notified: Raliegh Ip Schorr  Orders Received/Actions taken: New order for 250 bolus and urinalysis

## 2018-01-04 NOTE — Care Management Note (Signed)
Case Management Note  Patient Details  Name: Judith [name] MRN: [id number] Date of Birth: April 17, 1951  Subjective/Objective:                  Acute on chronic hypoxic/hypercarbic respiratory failure: In setting of pt w/ h/o CHF, asthma (+/- COPD as pt smoker for 40+ yrs and family confirms pt still smokes). Somnolence likely IV Ativan and several days of no sleep per sister. Slightly acidotic and hypercarbic.  Discussed pt w/ Dr Oletta Darter w/ [facility] who recommends continuing BiPAP for now and repeat ABG in 2-3 hours. Agrees somnolence likely a result of IV Ativan. Recommended d/c Ativan and use low dose Haldol instead if pt becomes agitated again. Also recommended starting pt on low dose Seroquel when off BiPAP and taking PO's again. Order placed for repeat ABG. Pt's daughter "[name]" and sister "[name]" who is also HPOA at bedside. Discussed pt's status at length. Family relate issues they are having properly caring for her because she is non-compliant at times. Family appreciated time spent at bedside and are in agreement w/ tonight's plan. Will follow next ABG. If improved will continue BiPAP until am. To continue close monitoring  in SDU.  Action/Plan: Following for cm needs and progression-continues on bipap for resp. failure  Expected Discharge Date:  (unknown)               Expected Discharge Plan:     In-House Referral:     Discharge planning Services     Post Acute Care Choice:    Choice offered to:     DME Arranged:    DME Agency:     HH Arranged:    HH Agency:     Status of Service:     If discussed at [employer], dates discussed:    Additional Comments:  Leeroy Cha, RN 01/04/2018, 9:56 AM

## 2018-01-05 ENCOUNTER — Inpatient Hospital Stay (HOSPITAL_COMMUNITY): Payer: Medicare PPO

## 2018-01-05 LAB — RENAL FUNCTION PANEL
ALBUMIN: 2.4 g/dL — AB (ref 3.5–5.0)
Anion gap: 8 (ref 5–15)
BUN: 22 mg/dL (ref 8–23)
CHLORIDE: 99 mmol/L (ref 98–111)
CO2: 30 mmol/L (ref 22–32)
Calcium: 9.1 mg/dL (ref 8.9–10.3)
Creatinine, Ser: 1.06 mg/dL — ABNORMAL HIGH (ref 0.44–1.00)
GFR calc Af Amer: >60 mL/min (ref >60)
GFR, EST NON AFRICAN AMERICAN: 54 mL/min — AB (ref >60)
Glucose, Bld: 122 mg/dL — ABNORMAL HIGH (ref 70–99)
PHOSPHORUS: 2.3 mg/dL — AB (ref 2.5–4.6)
POTASSIUM: 3.6 mmol/L (ref 3.5–5.1)
SODIUM: 137 mmol/L (ref 135–145)

## 2018-01-05 LAB — CBC WITH DIFFERENTIAL/PLATELET
BASOS ABS: 0 10*3/uL (ref 0.0–0.1)
Basophils Relative: 0 %
EOS PCT: 1 %
Eosinophils Absolute: 0.2 10*3/uL (ref 0.0–0.7)
HCT: 25.9 % — ABNORMAL LOW (ref 36.0–46.0)
Hemoglobin: 8.4 g/dL — ABNORMAL LOW (ref 12.0–15.0)
LYMPHS ABS: 1.4 10*3/uL (ref 0.7–4.0)
LYMPHS PCT: 9 %
MCH: 27.5 pg (ref 26.0–34.0)
MCHC: 32.4 g/dL (ref 30.0–36.0)
MCV: 84.6 fL (ref 78.0–100.0)
Monocytes Absolute: 1.1 10*3/uL — ABNORMAL HIGH (ref 0.1–1.0)
Monocytes Relative: 8 %
NEUTROS PCT: 82 %
Neutro Abs: 11.8 10*3/uL — ABNORMAL HIGH (ref 1.7–7.7)
PLATELETS: 544 10*3/uL — AB (ref 150–400)
RBC: 3.06 MIL/uL — AB (ref 3.87–5.11)
RDW: 15 % (ref 11.5–15.5)
WBC: 14.5 10*3/uL — AB (ref 4.0–10.5)

## 2018-01-05 LAB — GLUCOSE, CAPILLARY
GLUCOSE-CAPILLARY: 140 mg/dL — AB (ref 70–99)
GLUCOSE-CAPILLARY: 189 mg/dL — AB (ref 70–99)
Glucose-Capillary: 138 mg/dL — ABNORMAL HIGH (ref 70–99)
Glucose-Capillary: 251 mg/dL — ABNORMAL HIGH (ref 70–99)
Glucose-Capillary: 169 mg/dL — ABNORMAL HIGH (ref 70–99)

## 2018-01-05 LAB — MRSA PCR SCREENING: MRSA BY PCR: NEGATIVE

## 2018-01-05 LAB — MAGNESIUM: Magnesium: 1.6 mg/dL — ABNORMAL LOW (ref 1.7–2.4)

## 2018-01-05 MED ORDER — AMLODIPINE BESYLATE 5 MG PO TABS
5.0000 mg | ORAL_TABLET | Freq: Every day | ORAL | Status: DC
  Administered 2018-01-05 – 2018-01-11 (×6): 5 mg via ORAL
  Filled 2018-01-05 (×6): qty 1

## 2018-01-05 MED ORDER — CHLORHEXIDINE GLUCONATE 4 % EX LIQD
60.0000 mL | Freq: Once | CUTANEOUS | Status: AC
  Administered 2018-01-06: 4 via TOPICAL
  Filled 2018-01-05: qty 60

## 2018-01-05 MED ORDER — IPRATROPIUM-ALBUTEROL 0.5-2.5 (3) MG/3ML IN SOLN
3.0000 mL | Freq: Four times a day (QID) | RESPIRATORY_TRACT | Status: DC
  Administered 2018-01-05 – 2018-01-06 (×7): 3 mL via RESPIRATORY_TRACT
  Filled 2018-01-05 (×7): qty 3

## 2018-01-05 MED ORDER — FUROSEMIDE 10 MG/ML IJ SOLN
40.0000 mg | Freq: Two times a day (BID) | INTRAMUSCULAR | Status: DC
  Administered 2018-01-05 – 2018-01-06 (×2): 40 mg via INTRAVENOUS
  Filled 2018-01-05 (×2): qty 4

## 2018-01-05 MED ORDER — SODIUM CHLORIDE 0.9 % IV BOLUS
250.0000 mL | Freq: Once | INTRAVENOUS | Status: AC
  Administered 2018-01-05: 250 mL via INTRAVENOUS

## 2018-01-05 MED ORDER — CEFAZOLIN SODIUM-DEXTROSE 2-4 GM/100ML-% IV SOLN
2.0000 g | INTRAVENOUS | Status: DC
  Filled 2018-01-05 (×2): qty 100

## 2018-01-05 MED ORDER — METOPROLOL TARTRATE 5 MG/5ML IV SOLN
2.5000 mg | Freq: Once | INTRAVENOUS | Status: AC
  Administered 2018-01-05: 2.5 mg via INTRAVENOUS
  Filled 2018-01-05: qty 5

## 2018-01-05 MED ORDER — METOPROLOL SUCCINATE ER 50 MG PO TB24
50.0000 mg | ORAL_TABLET | Freq: Two times a day (BID) | ORAL | Status: DC
  Administered 2018-01-05 – 2018-01-11 (×11): 50 mg via ORAL
  Filled 2018-01-05 (×8): qty 1
  Filled 2018-01-05: qty 2
  Filled 2018-01-05 (×2): qty 1

## 2018-01-05 NOTE — Plan of Care (Signed)
  Problem: Elimination: Goal: Will not experience complications related to urinary retention Outcome: Progressing   Problem: Safety: Goal: Ability to remain free from injury will improve Outcome: Progressing   

## 2018-01-05 NOTE — Progress Notes (Signed)
[facility] PROGRESS NOTE  Judith [name] FUX:[id number] DOB: [birth year] DOA: 01/02/2018 PCP: [name], PA-C   Narrative:  73 fem insulin-dependent diabetes (allergic to some types of insulin?)  + diabetic polyneuropathy, chronic kidney disease stage III, HTN, HLD, paroxysmal atrial fibrillation on Eliquis with chads score >3 post multiple DCCV in the past followed for A. fib RVR--on sotalol in addition to hospitalization 08/2017 where she was in A. fib RVR placed on IV amiodarone  That hospitalization was complicated by lower GI bleed she received 1 unit of PRBC amiodarone was discontinued and sotalol was increased-she was also discontinued off of Plavix and only kept on Eliquis because of the same  CAD status post two-vessel disease + PCI DES, reflux, goiter on Tapazole, right foot amputation 2017 Dr. Sharol Given, obstructive sleep apnea on BiPAP nightly, morbid obesity Admitted 01/02/2018 fever found to have cough in addition to anorexia and increased swelling of the foot which started draining clear liquid  Orthopedics Dr. Sharol Given consulted-antibiotics changed from home medications of doxycycline to vancomycin/cefepime Hospitalization comp gated by acute respiratory failure with hypoxia secondary to possible pulmonary edema and required high flow nasal cannula overnight 8/27/8/28 placed on BiPAP   A & Plan Acute hypoxic respiratory failure secondary to iatrogenic fluid overload-overnight 8/28 placed on BiPAP-tolerating high flow nasal cannula fine current I&O even-needs education regarding heart failure-  Permanent A. fib status post DCCV multiple times maintained on sotalol, [place] 5-on apixaban as well-keep on telemetry,  Had RVR given pushes of IV metoprolol-increase 8/29 Toprol-XL to 50 twice daily continue Betapace 120 every 12 if further rhythm related issues will need cardiology input Is unlikely she will permanently remain in sinus given her large LA tract  HTN-cut back dose of  Norvasc from 10-5  Sepsis secondary to multiple causes-has oozing area on left sole of foot which will need debridement that is a source of leukocytosis and fever-timing needs to be discussed based on hemodynamic stability-could tentatively plan for 8/30 p.m.-from my perspective is hemodynamically stable at this time but need to watch overnight Continue tramadol 50 every 6 as needed pain first choice morphine 2 mg every 4 as needed second choice  Insulin-dependent diabetes mellitus-C8.77 2919-current management sliding scale and Lantus 20 units-blood sugar ranges 122-184  Diabetic nephropathy stage III-keep volume even at this time-resuming later on this admission metformin, she follows with Dr. Loanne Drilling as an outpatient and has been maintained  History CAD-off of anti plt secondary to bleed since 08/2017  Graves' disease status post XRT + Tapazole--has been maintained on current dose of Tapazole 10 daily for a long time according to family  Prior GI bleed 08/2017-now off of Plavix continue Eliquis-hemoglobin stable  Hyperlipidemia continue atorvastatin 20, fenofibrate 160  BiPolar-continue Prozac 20 at bedtime, Wellbutrin 150 twice daily-initial inciting event was possibly anxiety-recommending Haldol if further recurrence   Patient on systemic anticoagulation, full code, discussed with family at the bedside in detail--- it is unclear if the patient takes her medications correctly at home-she defers a lot of decision-making to her sister at the bedside [name] and does not recall Tikosyn discussion-it may be worthwhile this admission getting cardiology involved to see if that is an option-, stepdown status  Verlon Au, MD  [facility] Direct contact: [phone] --Via amion app OR  --[email or web address]; password [email or web address]  7PM-7AM contact night coverage as above 01/05/2018, 7:44 AM  LOS: 3 days   Consultants:  Orthopedics  Procedures:  No  Antimicrobials:  vank cefepime  Interval  history/Subjective:  Fever  plus tachycardia overnight Off of nasal cannula she desats to the 89 range-she is however much more comfortable awake and oriented-she is not in any distress and seems to be a lot better than prior Hungry No fever currently, no cough, no diarrhea  Objective:  Vitals:  Vitals:   01/05/18 0400 01/05/18 0600  BP: (!) 145/58 (!) 141/68  Pulse: (!) 59 64  Resp: (!) 25 (!) 23  Temp: 97.6 F (36.4 C)   SpO2: 98% 97%    Exam:  EOMI NCAT thick neck Mallampati 3 poor dentition neck soft no submandibular lymphadenopathy Chest is clinically clear with decreased air entry posterolaterally S1-S2 sinus rhythm occasional PVC Abdomen obese nontender no rebound Neurologically intact coherent moves all 4 limbs equally Skin exam shows left sole of foot oozing dusky cream-colored fluid from puncture wound it is tender to touch  I have personally reviewed the following:   Labs:  BUN/creatinine 22/1.06  WBC 14.5 platelets 544  Hemoglobin down from 9-8.4 MCV 8 4  Imaging studies: CXR fr my read from 8/29 shows clearing of infiltrates cardiomegaly  Medical tests:  Reviewed  Test discussed with performing physician:  No  Decision to obtain old records:  No  Review and summation of old records:  Yes  Scheduled Meds: . amLODipine  5 mg Oral Daily  . apixaban  5 mg Oral BID  . atorvastatin  20 mg Oral QPM  . buPROPion  150 mg Oral BID  . chlorhexidine  15 mL Mouth Rinse BID  . fenofibrate  160 mg Oral Daily  . FLUoxetine  20 mg Oral QHS  . furosemide  40 mg Intravenous Q12H  . gabapentin  600 mg Oral BID  . insulin glargine  20 Units Subcutaneous QHS  . insulin lispro  0-5 Units Subcutaneous QHS  . insulin lispro  0-9 Units Subcutaneous TID WC  . ipratropium-albuterol  3 mL Nebulization QID  . magnesium oxide  400 mg Oral Daily  . mouth rinse  15 mL Mouth Rinse q12n4p  . methimazole  10 mg Oral Daily  . metoprolol succinate  50 mg Oral BID  .  pantoprazole  40 mg Oral Daily  . potassium chloride SA  20 mEq Oral BID  . sodium chloride flush  3 mL Intravenous Q12H  . sotalol  120 mg Oral Q12H   Continuous Infusions: . sodium chloride    . ceFEPime (MAXIPIME) IV 2 g (01/05/18 0542)  . vancomycin Stopped (01/04/18 1856)    Principal Problem:   Cellulitis Active Problems:   GERD (gastroesophageal reflux disease)   Hyperthyroidism   Obstructive sleep apnea   S/P transmetatarsal amputation of foot, right (HCC)   Type II diabetes mellitus, uncontrolled ([place])   Peripheral neuropathy   Acute respiratory failure with hypoxia ([place])   LOS: 3 days

## 2018-01-05 NOTE — Care Management Note (Signed)
Case Management Note  Patient Details  Name: Jette Lewan MRN: [id number] Date of Birth: [birth year]  Subjective/Objective:    Progression from chart              55 fem insulin-dependent diabetes (allergic to some types of insulin?)  + diabetic polyneuropathy, chronic kidney disease stage III, HTN, HLD, paroxysmal atrial fibrillation on Eliquis with chads score >3 post multiple DCCV in the past followed for A. fib RVR--on sotalol in addition to hospitalization 08/2017 where she was in A. fib RVR placed on IV amiodarone             That hospitalization was complicated by lower GI bleed she received 1 unit of PRBC amiodarone was discontinued and sotalol was increased-she was also discontinued off of Plavix and only kept on Eliquis because of the same  CAD status post two-vessel disease + PCI DES, reflux, goiter on Tapazole, right foot amputation 2017 Dr. Sharol Given, obstructive sleep apnea on BiPAP nightly, morbid obesity Admitted 01/02/2018 fever found to have cough in addition to anorexia and increased swelling of the foot which started draining clear liquid  Orthopedics Dr. Sharol Given consulted-antibiotics changed from home medications of doxycycline to vancomycin/cefepime Hospitalization comp gated by acute respiratory failure with hypoxia secondary to possible pulmonary edema and required high flow nasal cannula overnight 8/27/8/28 placed on BiPAP   A & Plan Acute hypoxic respiratory failure secondary to iatrogenic fluid overload-overnight 8/28 placed on BiPAP-tolerating high flow nasal cannula fine current I&O even-needs education regarding heart failure-  Permanent A. fib status post DCCV multiple times maintained on sotalol, [place] 5-on apixaban as well-keep on telemetry,  Had RVR given pushes of IV metoprolol-increase 8/29 Toprol-XL to 50 twice daily continue Betapace 120 every 12 if further rhythm related issues will need cardiology input Is unlikely she will permanently remain in sinus given her  large LA tract  HTN-cut back dose of Norvasc from 10-5  Sepsis secondary to multiple causes-has oozing area on left sole of foot which will need debridement that is a source of leukocytosis and fever-timing needs to be discussed based on hemodynamic stability-could tentatively plan for 8/30 p.m.-from my perspective is hemodynamically stable at this time but need to watch overnight Continue tramadol 50 every 6 as needed pain first choice morphine 2 mg every 4 as needed second choice  Insulin-dependent diabetes mellitus-C8.77 2919-current management sliding scale and Lantus 20 units-blood sugar ranges 122-184  Diabetic nephropathy stage III-keep volume even at this time-resuming later on this admission metformin, she follows with Dr. Loanne Drilling as an outpatient and has been maintained  History CAD-off of anti plt secondary to bleed since 08/2017  Graves' disease status post XRT + Tapazole--has been maintained on current dose of Tapazole 10 daily for a long time according to family  Prior GI bleed 08/2017-now off of Plavix continue Eliquis-hemoglobin stable  Hyperlipidemia continue atorvastatin 20, fenofibrate 160  BiPolar-continue Prozac 20 at bedtime, Wellbutrin 150 twice daily-initial inciting event was possibly anxiety-recommending Haldol if further recurrence   Patient on systemic anticoagulation, full code, discussed with family at the bedside in detail--- it is unclear if the patient takes her medications correctly at home-she defers a lot of decision-making to her sister at the bedside [name] and does not recall Tikosyn discussion-it may be worthwhile this admission getting cardiology involved to see if that is an option-, stepdown status   Action/Plan: Patient progressed from bi-pap to hfnc at 30%/will follow prgression and cm needs may need home o2 and hhc.  Expected  Discharge Date:  (unknown)               Expected Discharge Plan:     In-House Referral:     Discharge  planning Services     Post Acute Care Choice:    Choice offered to:     DME Arranged:    DME Agency:     HH Arranged:    HH Agency:     Status of Service:     If discussed at [employer], dates discussed:    Additional Comments:  Leeroy Cha, RN 01/05/2018, 10:45 AM

## 2018-01-05 NOTE — Progress Notes (Signed)
Pt reviewed Looks good on nasal canula D/w Dr. Ike Bene place on list for afternoon surgery tomorrow From my perspective can have the surgery [would be peroneal block with no MAC] updated patient and family and d/w Dr. Sloan Leiter who is available if prn today  transferrign to [facility] today when tele bed avail  Verneita Griffes, MD [facility] 1:26 PM

## 2018-01-05 NOTE — Progress Notes (Signed)
Around 5 am, patient wanted to sit up and drink water. Placed patient on HFNC. Patient drank water and ate graham crackers. She tolerated well. I assessed her during and after. Kept patient on HFNC d/t patient request and how well she did while on it. Observed no work of breathing and O2 stayed in high 90s/100%

## 2018-01-05 NOTE — Progress Notes (Addendum)
Patient's HR 110s to 130s. Patient has no c/o chest pain or shortness of breath. Paged K. Schorr. New order for 2.5 mg IV metoprolol and another 250 bolus ordered and given. Rhythm converted to SB staying the the mid 69s. Paged K. Schorr again. She stated continue to monitor HR & BP and page her again if patient is symptomatic. Continue to monitor.

## 2018-01-05 NOTE — Progress Notes (Signed)
Pt arrived from [facility] to 6n9 by carelink.

## 2018-01-06 ENCOUNTER — Inpatient Hospital Stay (HOSPITAL_COMMUNITY): Payer: Medicare PPO | Admitting: Certified Registered"

## 2018-01-06 ENCOUNTER — Encounter (HOSPITAL_COMMUNITY): Payer: Self-pay | Admitting: *Deleted

## 2018-01-06 ENCOUNTER — Encounter (HOSPITAL_COMMUNITY)
Admission: EM | Disposition: A | Discharge status: Skilled Nursing Facility | Payer: Self-pay | Source: Home / Self Care | Attending: Internal Medicine

## 2018-01-06 DIAGNOSIS — L02612 Cutaneous abscess of left foot: Secondary | ICD-10-CM

## 2018-01-06 DIAGNOSIS — I5031 Acute diastolic (congestive) heart failure: Secondary | ICD-10-CM

## 2018-01-06 HISTORY — PX: I & D EXTREMITY: SHX5045

## 2018-01-06 LAB — IRON AND TIBC
Iron: 14 ug/dL — ABNORMAL LOW (ref 28–170)
Saturation Ratios: 5 % — ABNORMAL LOW (ref 10.4–31.8)
TIBC: 256 ug/dL (ref 250–450)
UIBC: 242 ug/dL

## 2018-01-06 LAB — GLUCOSE, CAPILLARY
Glucose-Capillary: 84 mg/dL (ref 70–99)
Glucose-Capillary: 75 mg/dL (ref 70–99)
Glucose-Capillary: 73 mg/dL (ref 70–99)
Glucose-Capillary: 130 mg/dL — ABNORMAL HIGH (ref 70–99)
Glucose-Capillary: 198 mg/dL — ABNORMAL HIGH (ref 70–99)

## 2018-01-06 LAB — VANCOMYCIN, TROUGH: VANCOMYCIN TR: 9 ug/mL — AB (ref 15–20)

## 2018-01-06 LAB — FERRITIN: Ferritin: 124 ng/mL (ref 11–307)

## 2018-01-06 LAB — VITAMIN B12: Vitamin B-12: 454 pg/mL (ref 180–914)

## 2018-01-06 SURGERY — IRRIGATION AND DEBRIDEMENT EXTREMITY
Anesthesia: General | Laterality: Left

## 2018-01-06 MED ORDER — MIDAZOLAM HCL 2 MG/2ML IJ SOLN
INTRAMUSCULAR | Status: AC
  Filled 2018-01-06: qty 2

## 2018-01-06 MED ORDER — CALCIUM CHLORIDE 10 % IV SOLN
INTRAVENOUS | Status: AC
  Filled 2018-01-06: qty 10

## 2018-01-06 MED ORDER — ONDANSETRON HCL 4 MG/2ML IJ SOLN
INTRAMUSCULAR | Status: DC | PRN
  Administered 2018-01-06: 4 mg via INTRAVENOUS

## 2018-01-06 MED ORDER — FUROSEMIDE 10 MG/ML IJ SOLN
40.0000 mg | Freq: Once | INTRAMUSCULAR | Status: AC
  Administered 2018-01-06: 40 mg via INTRAVENOUS
  Filled 2018-01-06: qty 4

## 2018-01-06 MED ORDER — DIPHENHYDRAMINE HCL 50 MG/ML IJ SOLN
INTRAMUSCULAR | Status: DC | PRN
  Administered 2018-01-06: 12.5 mg via INTRAVENOUS

## 2018-01-06 MED ORDER — ONDANSETRON HCL 4 MG/2ML IJ SOLN
INTRAMUSCULAR | Status: AC
  Filled 2018-01-06: qty 2

## 2018-01-06 MED ORDER — 0.9 % SODIUM CHLORIDE (POUR BTL) OPTIME
TOPICAL | Status: DC | PRN
  Administered 2018-01-06: 1000 mL

## 2018-01-06 MED ORDER — PHENYLEPHRINE 40 MCG/ML (10ML) SYRINGE FOR IV PUSH (FOR BLOOD PRESSURE SUPPORT)
PREFILLED_SYRINGE | INTRAVENOUS | Status: AC
  Filled 2018-01-06: qty 10

## 2018-01-06 MED ORDER — MAGNESIUM SULFATE 2 GM/50ML IV SOLN
2.0000 g | Freq: Once | INTRAVENOUS | Status: AC
  Administered 2018-01-06: 2 g via INTRAVENOUS
  Filled 2018-01-06: qty 50

## 2018-01-06 MED ORDER — PROPOFOL 10 MG/ML IV BOLUS
INTRAVENOUS | Status: DC | PRN
  Administered 2018-01-06: 150 mg via INTRAVENOUS

## 2018-01-06 MED ORDER — DEXAMETHASONE SODIUM PHOSPHATE 10 MG/ML IJ SOLN
INTRAMUSCULAR | Status: AC
  Filled 2018-01-06: qty 1

## 2018-01-06 MED ORDER — MIDAZOLAM HCL 5 MG/5ML IJ SOLN
INTRAMUSCULAR | Status: DC | PRN
  Administered 2018-01-06: 1 mg via INTRAVENOUS

## 2018-01-06 MED ORDER — LIDOCAINE 2% (20 MG/ML) 5 ML SYRINGE
INTRAMUSCULAR | Status: AC
  Filled 2018-01-06: qty 5

## 2018-01-06 MED ORDER — PROPOFOL 10 MG/ML IV BOLUS
INTRAVENOUS | Status: AC
  Filled 2018-01-06: qty 20

## 2018-01-06 MED ORDER — VANCOMYCIN HCL 10 G IV SOLR: 1250.0000 mg | INTRAVENOUS | Status: DC

## 2018-01-06 MED ORDER — METOPROLOL TARTRATE 5 MG/5ML IV SOLN
5.0000 mg | Freq: Every day | INTRAVENOUS | Status: DC | PRN
  Administered 2018-01-06: 5 mg via INTRAVENOUS
  Filled 2018-01-06: qty 5

## 2018-01-06 MED ORDER — CALCIUM CHLORIDE 10 % IV SOLN
INTRAVENOUS | Status: DC | PRN
  Administered 2018-01-06: 100 mg via INTRAVENOUS

## 2018-01-06 MED ORDER — DIPHENHYDRAMINE HCL 50 MG/ML IJ SOLN
INTRAMUSCULAR | Status: AC
  Filled 2018-01-06: qty 1

## 2018-01-06 MED ORDER — LIDOCAINE HCL (CARDIAC) PF 100 MG/5ML IV SOSY
PREFILLED_SYRINGE | INTRAVENOUS | Status: DC | PRN
  Administered 2018-01-06: 60 mg via INTRAVENOUS

## 2018-01-06 MED ORDER — VANCOMYCIN HCL IN DEXTROSE 750-5 MG/150ML-% IV SOLN
750.0000 mg | Freq: Two times a day (BID) | INTRAVENOUS | Status: DC
  Administered 2018-01-07 – 2018-01-10 (×7): 750 mg via INTRAVENOUS
  Filled 2018-01-06 (×7): qty 150

## 2018-01-06 MED ORDER — FUROSEMIDE 10 MG/ML IJ SOLN
60.0000 mg | Freq: Two times a day (BID) | INTRAMUSCULAR | Status: DC
  Administered 2018-01-06 – 2018-01-08 (×4): 60 mg via INTRAVENOUS
  Filled 2018-01-06 (×4): qty 6

## 2018-01-06 MED ORDER — LACTATED RINGERS IV SOLN
INTRAVENOUS | Status: DC
  Administered 2018-01-06: 19:00:00 via INTRAVENOUS

## 2018-01-06 MED ORDER — FENTANYL CITRATE (PF) 100 MCG/2ML IJ SOLN
INTRAMUSCULAR | Status: DC | PRN
  Administered 2018-01-06: 50 ug via INTRAVENOUS

## 2018-01-06 MED ORDER — PHENYLEPHRINE 40 MCG/ML (10ML) SYRINGE FOR IV PUSH (FOR BLOOD PRESSURE SUPPORT)
PREFILLED_SYRINGE | INTRAVENOUS | Status: AC
  Filled 2018-01-06: qty 20

## 2018-01-06 MED ORDER — FENTANYL CITRATE (PF) 250 MCG/5ML IJ SOLN
INTRAMUSCULAR | Status: AC
  Filled 2018-01-06: qty 5

## 2018-01-06 MED ORDER — PHENYLEPHRINE HCL 10 MG/ML IJ SOLN
INTRAMUSCULAR | Status: DC | PRN
  Administered 2018-01-06 (×2): 120 ug via INTRAVENOUS
  Administered 2018-01-06: 160 ug via INTRAVENOUS
  Administered 2018-01-06: 200 ug via INTRAVENOUS

## 2018-01-06 MED ORDER — LACTATED RINGERS IV SOLN
INTRAVENOUS | Status: DC
  Administered 2018-01-06: 15:00:00 via INTRAVENOUS

## 2018-01-06 SURGICAL SUPPLY — 34 items
BLADE SURG 21 STRL SS (BLADE) ×3 IMPLANT
BNDG COHESIVE 6X5 TAN STRL LF (GAUZE/BANDAGES/DRESSINGS) ×2 IMPLANT
BNDG GAUZE ELAST 4 BULKY (GAUZE/BANDAGES/DRESSINGS) ×4 IMPLANT
COVER SURGICAL LIGHT HANDLE (MISCELLANEOUS) ×4 IMPLANT
DRAIN PENROSE 1/2X12 LTX STRL (WOUND CARE) ×2 IMPLANT
DRAPE U-SHAPE 47X51 STRL (DRAPES) ×3 IMPLANT
DRSG ADAPTIC 3X8 NADH LF (GAUZE/BANDAGES/DRESSINGS) ×3 IMPLANT
DURAPREP 26ML APPLICATOR (WOUND CARE) ×1 IMPLANT
ELECT REM PT RETURN 9FT ADLT (ELECTROSURGICAL)
ELECTRODE REM PT RTRN 9FT ADLT (ELECTROSURGICAL) IMPLANT
GAUZE SPONGE 4X4 12PLY STRL (GAUZE/BANDAGES/DRESSINGS) ×3 IMPLANT
GAUZE SPONGE 4X4 12PLY STRL LF (GAUZE/BANDAGES/DRESSINGS) ×2 IMPLANT
GLOVE BIOGEL PI IND STRL 9 (GLOVE) ×1 IMPLANT
GLOVE BIOGEL PI INDICATOR 9 (GLOVE) ×2
GLOVE SURG ORTHO 9.0 STRL STRW (GLOVE) ×3 IMPLANT
GOWN STRL REUS W/ TWL XL LVL3 (GOWN DISPOSABLE) ×2 IMPLANT
GOWN STRL REUS W/TWL XL LVL3 (GOWN DISPOSABLE) ×4
HANDPIECE INTERPULSE COAX TIP (DISPOSABLE)
KIT BASIN OR (CUSTOM PROCEDURE TRAY) ×3 IMPLANT
KIT TURNOVER KIT B (KITS) ×3 IMPLANT
MANIFOLD NEPTUNE II (INSTRUMENTS) ×1 IMPLANT
NS IRRIG 1000ML POUR BTL (IV SOLUTION) ×3 IMPLANT
PACK ORTHO EXTREMITY (CUSTOM PROCEDURE TRAY) ×3 IMPLANT
PAD ABD 8X10 STRL (GAUZE/BANDAGES/DRESSINGS) ×2 IMPLANT
PAD ARMBOARD 7.5X6 YLW CONV (MISCELLANEOUS) ×2 IMPLANT
SET HNDPC FAN SPRY TIP SCT (DISPOSABLE) IMPLANT
STOCKINETTE IMPERVIOUS 9X36 MD (GAUZE/BANDAGES/DRESSINGS) IMPLANT
SUT ETHILON 2 0 PSLX (SUTURE) ×6 IMPLANT
SWAB COLLECTION DEVICE MRSA (MISCELLANEOUS) ×3 IMPLANT
SWAB CULTURE ESWAB REG 1ML (MISCELLANEOUS) IMPLANT
TOWEL OR 17X26 10 PK STRL BLUE (TOWEL DISPOSABLE) ×3 IMPLANT
TUBE CONNECTING 12'X1/4 (SUCTIONS) ×1
TUBE CONNECTING 12X1/4 (SUCTIONS) ×2 IMPLANT
YANKAUER SUCT BULB TIP NO VENT (SUCTIONS) ×3 IMPLANT

## 2018-01-06 NOTE — Progress Notes (Signed)
Dr. Clementeen Graham ordered metoprolol  5mg  PRN for heart rate >120s and magnesium sulfate 2 gm IV.

## 2018-01-06 NOTE — Progress Notes (Signed)
Pt hear rate 130s I assessed the pt asymptomatic, telemetry tech aware, will continue to monitor.

## 2018-01-06 NOTE — Op Note (Signed)
01/06/2018  3:59 PM  PATIENT:  Judith [name]    PRE-OPERATIVE DIAGNOSIS:  Abscess Left Foot  POST-OPERATIVE DIAGNOSIS:  Same  PROCEDURE:  DEBRIDEMENT ULCER LEFT FOOT, sharp excisional debridement of skin and soft tissue muscle and tendon.  SURGEON:  [name], MD  PHYSICIAN ASSISTANT:None ANESTHESIA:   General  PREOPERATIVE INDICATIONS:  Judith [name] is a  67 y.o. female with a diagnosis of Abscess Left Foot who failed conservative measures and elected for surgical management.    The risks benefits and alternatives were discussed with the patient preoperatively including but not limited to the risks of infection, bleeding, nerve injury, cardiopulmonary complications, the need for revision surgery, among others, and the patient was willing to proceed.  OPERATIVE IMPLANTS: Penrose drain.,  Cultures were obtained x2.  @ENCIMAGES @  OPERATIVE FINDINGS: Large extensive abscess involving both the plantar and dorsal aspect of the left foot.  OPERATIVE PROCEDURE: Patient was brought the operating room and underwent a general anesthetic.  After adequate levels of anesthesia were obtained patient's left lower extremity was prepped using DuraPrep draped into a sterile field a timeout was called.  A dorsal incision was made over a fluctuant area over the dorsum of the foot a large abscess was encountered.  A football shaped incision was made around the plantar ulcer and patient had necrotic soft tissue as well as further abscess that communicated with the dorsum of her foot.  All nonviable skin tissue muscle and tendon was sharply excised with a knife and rondure back to healthy viable tissue.  The wound was irrigated with normal saline a Penrose drain was placed dorsal to plantar and the incisions were closed except for the Penrose drain.  A sterile dressing was applied patient was extubated taken to PACU in stable condition.   DISCHARGE PLANNING:  Antibiotic duration: Continue IV antibiotics  until the deep tissue cultures are finalized then would discharge on 4 weeks of oral antibiotics.  Weightbearing: Touchdown weightbearing on the left  Pain medication: Opioid pathway ordered  Dressing care/ Wound VAC: Reinforce dressing as needed  Ambulatory devices: Walker  Discharge to: Anticipate discharge to home once cultures are finalized.  Follow-up: In the office 1 week post operative.

## 2018-01-06 NOTE — Interval H&P Note (Signed)
History and Physical Interval Note:  01/06/2018 7:31 AM  Judith [name]  has presented today for surgery, with the diagnosis of Abscess Left Foot  The various methods of treatment have been discussed with the patient and family. After consideration of risks, benefits and other options for treatment, the patient has consented to  Procedure(s): DEBRIDEMENT ULCER LEFT FOOT (Left) as a surgical intervention .  The patient's history has been reviewed, patient examined, no change in status, stable for surgery.  I have reviewed the patient's chart and labs.  Questions were answered to the patient's satisfaction.     [name]

## 2018-01-06 NOTE — Progress Notes (Addendum)
Pharmacy Antibiotic Note  Judith [name] is a 67 y.o. female admitted on 01/02/2018 with cellulitis.  Pharmacy has been consulted for vanc dosing.   Pt was tx here from [facility] for debridement of left foot ulcer. Wound cx was done today. Plan is to de-escalate abx once cx is back and stay on abx x 4 wks. All current cx neg so far. We will target for a higher trough due to likelihood of osteo/tendon infection.   Scr 1.06, wbc 14.5, ABX D3 with vanc/cefepime Vanc trough came back at 9 this PM. Goal 15-20  Plan: Change vanc to 750mg  IV q12 F/u another VT if needed F/u with de-escalation for PO  Height: 5\' 4"  (162.6 cm) Weight: 176 lb 9.4 oz (80.1 kg) IBW/kg (Calculated) : 54.7  Temp (24hrs), Avg:98.4 F (36.9 C), Min:97.2 F (36.2 C), Max:100.4 F (38 C)  Recent Labs  Lab 01/02/18 1413 01/02/18 1422 01/03/18 0536 01/04/18 0331 01/05/18 0321 01/06/18 1701  WBC 16.9*  --  15.0* 14.2* 14.5*  --   CREATININE 1.03*  --  0.99 1.01* 1.06*  --   LATICACIDVEN  --  1.99*  --   --   --   --   VANCOTROUGH  --   --   --   --   --  9*    Estimated Creatinine Clearance: 54.2 mL/min (A) (by C-G formula based on SCr of 1.06 mg/dL (H)).    Allergies  Allergen Reactions  . Contrast Media [Iodinated Diagnostic Agents] Hives    Spoke to patient, Iodine allergy is really IV contrast allergy.   . Dilaudid [Hydromorphone Hcl] Other (See Comments)    HEADACHE  . Novolog [Insulin Aspart] Shortness Of Breath    "breathing problems"  . Codeine Nausea And Vomiting    HIGH DOSES-SEVERE VOMITING  . Iodine Other (See Comments)    MUST HAVE BENADRYL PRIOR TO PROCEDURE AND RIGHT BEFORE TREATMENT TO COUNTERACT REACTION-BLISTERING REACTION DERMATOLOGICAL  . Penicillins Itching, Rash and Other (See Comments)    Has patient had a PCN reaction causing immediate rash, facial/tongue/throat swelling, SOB or lightheadedness with hypotension: no Has patient had a PCN reaction causing severe rash involving mucus  membranes or skin necrosis: No Has patient had a PCN reaction that required hospitalization No Has patient had a PCN reaction occurring within the last 10 years: No If all of the above answers are "NO", then may proceed with Cephalosporin use.  CHEST SIZED RASH AND ITCHING   . Propofol Other (See Comments)    "Breathing problems - asthma attack" Can take with benadryl   . Ace Inhibitors Cough  . Demerol [Meperidine] Nausea And Vomiting  . Neosporin [Neomycin-Bacitracin Zn-Polymyx] Itching and Rash    MAKES REACTIONS WORSE WHEN USING AS PROPHYLACTIC  . Percocet [Oxycodone-Acetaminophen] Rash  . Tape Itching and Rash    Antimicrobials this admission: PTA Doxycycline, Cephalexin  8/26 Zosyn x 1 dose  8/26 Cefepime>> 8/26 Vanc >>  Dose adjustments this admission: Vanc 1g>>1.25g q24  Microbiology results: 8/26BCx: NGTD 8/27 HIV antibody: neg 8/30 deep tissue>> 8/29 MRSA>>neg   Onnie Boer, PharmD, [employer] [place], AAHIVP, CPP Infectious Disease Pharmacist Pager: [phone] 01/06/2018 6:57 PM

## 2018-01-06 NOTE — Transfer of Care (Signed)
Immediate Anesthesia Transfer of Care Note  Patient: Judith [name]  Procedure(s) Performed: DEBRIDEMENT ULCER LEFT FOOT (Left )  Patient Location: PACU  Anesthesia Type:General  Level of Consciousness: awake and patient cooperative  Airway & Oxygen Therapy: Patient Spontanous Breathing and Patient connected to nasal cannula oxygen  Post-op Assessment: Report given to RN and Post -op Vital signs reviewed and stable  Post vital signs: Reviewed and stable  Last Vitals:  Vitals Value Taken Time  BP    Temp    Pulse    Resp    SpO2      Last Pain:  Vitals:   01/06/18 1332  TempSrc: Oral  PainSc:       Patients Stated Pain Goal: 3 (88/89/16 9450)  Complications: No apparent anesthesia complications

## 2018-01-06 NOTE — Progress Notes (Signed)
PROGRESS NOTE                                                                                                                                                                                                             Patient Demographics:    Judith [name], is a 67 y.o. female, DOB - October 23, 1950, XTA:[id number]  Admit date - 01/02/2018   Admitting Physician Debbe Odea, MD  Outpatient Primary MD for the patient is [name], PA-C  LOS - 4  Outpatient Specialists: none  Chief Complaint  Patient presents with  . 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 fib on Eliquis status post multiple DCCV in the past, hypertension, hyperlipidemia, CAD with history of PCI to DES, GERD, history of goiter, right foot amputation, OSA on BiPAP who presented to [facility] with fever and redness with increased swelling of the left foot that started draining clear liquid. Patient was placed on empiric IV antibiotics and orthopedics consulted.  Hospital stay complicated with development of acute respiratory failure with hypoxia secondary to pulmonary edema requiring high flow nasal cannula and BiPAP.  Next line patient transferred to [facility] for I&D of the left foot abscess.   Subjective:   Patient says her breathing is slightly better from yesterday however still gets dyspneic on trying to get out of bed and requiring 3 L via nasal cannula.   Assessment  & Plan :    Principal Problem: Sepsis secondary to left leg cellulitis with abscess Sepsis mostly resolved except for intermittent fever with T-max of 100.4 F.  Continue empiric vancomycin and cefepime.  For I&D this afternoon. Pain control and PRN Tylenol.  Active Problems: Acute respiratory  failure with hypoxia (HCC) Acute diastolic CHF (HCC) Associated with pulmonary edema requiring BiPAP and high flow nasal cannula.   now on 3 L via nasal cannula.  Currently getting IV Lasix 40 mg every 12 hours.  I have ordered an extra dose of IV Lasix 20 mg this afternoon followed by increase in dose to 60 mg twice daily.  Monitor strict I/O and daily weight. 2D echo shows EF of 55-60% with LVH with grade 2 diastolic  dysfunction.    Type II diabetes mellitus, uncontrolled with nephropathy and polyneuropathy  (HCC) A1c of 8.7.  Continue Lantus at bedtime.  Currently on sliding scale coverage with stable CBG.  Chronic kidney disease stage III Renal function stable.  Coronary artery disease Asymptomatic.  Continue statin.  Permanent A. fib Rate controlled on the monitor.  Continue sotalol and Eliquis.  Discontinue cardiac monitor if stable postop.  Essential hypertension Continue amlodipine.  Anemia Noted for drop in H&H from baseline around 12.  Check iron panel, Hemoccult and B12.  Hypomagnesemia Replenished  Code Status : Full code  Family Communication  : Sister at bedside  Disposition Plan  : Pending hospital course  Barriers For Discharge : Active symptoms  Consults  : Dr. Sharol Given  Procedures  : Pending I&D  DVT Prophylaxis  : Eliquis  Lab Results  Component Value Date   PLT 544 (H) 01/05/2018    Antibiotics  :    Anti-infectives (From admission, onward)   Start     Dose/Rate Route Frequency Ordered Stop   01/06/18 0600  ceFAZolin (ANCEF) IVPB 2g/100 mL premix     2 g 200 mL/hr over 30 Minutes Intravenous To ShortStay Surgical 01/05/18 1945 01/07/18 0600   01/03/18 1800  vancomycin (VANCOCIN) IVPB 750 mg/150 ml premix  Status:  Discontinued     750 mg 150 mL/hr over 60 Minutes Intravenous Every 24 hours 01/02/18 1712 01/03/18 1135   01/03/18 1800  vancomycin (VANCOCIN) IVPB 1000 mg/200 mL premix     1,000 mg 200 mL/hr over 60 Minutes Intravenous Every 24  hours 01/03/18 1135     01/03/18 1800  ceFEPIme (MAXIPIME) 2 g in sodium chloride 0.9 % 100 mL IVPB     2 g 200 mL/hr over 30 Minutes Intravenous Every 12 hours 01/03/18 1142     01/03/18 0000  ceFEPIme (MAXIPIME) 1 g in sodium chloride 0.9 % 100 mL IVPB  Status:  Discontinued     1 g 200 mL/hr over 30 Minutes Intravenous Every 8 hours 01/02/18 1712 01/03/18 1142   01/02/18 1645  vancomycin (VANCOCIN) 1,500 mg in sodium chloride 0.9 % 500 mL IVPB     1,500 mg 250 mL/hr over 120 Minutes Intravenous STAT 01/02/18 1639 01/02/18 1941   01/02/18 1415  ceFEPIme (MAXIPIME) 2 g in sodium chloride 0.9 % 100 mL IVPB     2 g 200 mL/hr over 30 Minutes Intravenous  Once 01/02/18 1406 01/02/18 1619   01/02/18 1415  piperacillin-tazobactam (ZOSYN) IVPB 3.375 g     3.375 g 100 mL/hr over 30 Minutes Intravenous  Once 01/02/18 1406 01/02/18 1550        Objective:   Vitals:   01/05/18 2227 01/06/18 0601 01/06/18 0612 01/06/18 0749  BP:  (!) 149/60    Pulse: 74 61    Resp: 16     Temp:  98.9 F (37.2 C)    TempSrc:  Axillary    SpO2:  (!) 89% 94% 91%  Weight:      Height:        Wt Readings from Last 3 Encounters:  01/05/18 80.1 kg  12/31/17 79.4 kg  12/12/17 79.5 kg     Intake/Output Summary (Last 24 hours) at 01/06/2018 1314 Last data filed at 01/06/2018 1002 Gross per 24 hour  Intake 380 ml  Output 2250 ml  Net -1870 ml     Physical Exam  Gen: not in distress, fatigued HEENT: no pallor, moist mucosa, supple neck Chest:  Fine bibasilar crackles CVS: S1 and S2 irregular, no murmurs GI: soft, NT, ND, BS+ Musculoskeletal: warm, swelling over left foot with erythema and tenderness, trace pitting edema bilaterally,     Data Review:    CBC Recent Labs  Lab 01/02/18 1413 01/03/18 0536 01/04/18 0331 01/05/18 0321  WBC 16.9* 15.0* 14.2* 14.5*  HGB 9.7* 9.0* 9.2* 8.4*  HCT 29.8* 28.1* 28.7* 25.9*  PLT 452* 428* 499* 544*  MCV 83.7 85.4 85.2 84.6  MCH 27.2 27.4 27.3 27.5    MCHC 32.6 32.0 32.1 32.4  RDW 14.5 14.6 14.6 15.0  LYMPHSABS 1.2  --   --  1.4  MONOABS 1.2*  --   --  1.1*  EOSABS 0.0  --   --  0.2  BASOSABS 0.0  --   --  0.0    Chemistries  Recent Labs  Lab 01/02/18 1413 01/03/18 0536 01/04/18 0331 01/05/18 0321  NA 135 133* 138 137  K 3.5 3.7 4.1 3.6  CL 93* 97* 99 99  CO2 28 26 28 30   GLUCOSE 204* 231* 167* 122*  BUN 21 19 26* 22  CREATININE 1.03* 0.99 1.01* 1.06*  CALCIUM 9.3 8.7* 9.1 9.1  MG  --   --   --  1.6*   ------------------------------------------------------------------------------------------------------------------ No results for input(s): CHOL, HDL, LDLCALC, TRIG, CHOLHDL, LDLDIRECT in the last 72 hours.  Lab Results  Component Value Date   HGBA1C 8.7 (A) 12/05/2017   ------------------------------------------------------------------------------------------------------------------ No results for input(s): TSH, T4TOTAL, T3FREE, THYROIDAB in the last 72 hours.  Invalid input(s): FREET3 ------------------------------------------------------------------------------------------------------------------ No results for input(s): VITAMINB12, FOLATE, FERRITIN, TIBC, IRON, RETICCTPCT in the last 72 hours.  Coagulation profile No results for input(s): INR, PROTIME in the last 168 hours.  No results for input(s): DDIMER in the last 72 hours.  Cardiac Enzymes Recent Labs  Lab 01/03/18 0942  TROPONINI 0.03*   ------------------------------------------------------------------------------------------------------------------    Component Value Date/Time   BNP 62.6 11/15/2015 0629    Inpatient Medications  Scheduled Meds: . amLODipine  5 mg Oral Daily  . apixaban  5 mg Oral BID  . atorvastatin  20 mg Oral QPM  . buPROPion  150 mg Oral BID  . chlorhexidine  15 mL Mouth Rinse BID  . fenofibrate  160 mg Oral Daily  . FLUoxetine  20 mg Oral QHS  . furosemide  40 mg Intravenous Once  . furosemide  60 mg Intravenous  Q12H  . gabapentin  600 mg Oral BID  . insulin glargine  20 Units Subcutaneous QHS  . insulin lispro  0-5 Units Subcutaneous QHS  . insulin lispro  0-9 Units Subcutaneous TID WC  . ipratropium-albuterol  3 mL Nebulization QID  . magnesium oxide  400 mg Oral Daily  . mouth rinse  15 mL Mouth Rinse q12n4p  . methimazole  10 mg Oral Daily  . metoprolol succinate  50 mg Oral BID  . pantoprazole  40 mg Oral Daily  . potassium chloride SA  20 mEq Oral BID  . sodium chloride flush  3 mL Intravenous Q12H  . sotalol  120 mg Oral Q12H   Continuous Infusions: . sodium chloride    .  ceFAZolin (ANCEF) IV    . ceFEPime (MAXIPIME) IV 2 g (01/06/18 0449)  . vancomycin 1,000 mg (01/05/18 1746)   PRN Meds:.sodium chloride, acetaminophen **OR** acetaminophen, albuterol, morphine injection, ondansetron **OR** ondansetron (ZOFRAN) IV, sodium chloride flush, traMADol  Micro Results Recent Results (from the past 240 hour(s))  WOUND CULTURE  Status: Abnormal   Collection Time: 12/31/17  4:40 PM  Result Value Ref Range Status   Gram Stain Result Final report  Final   Organism ID, Bacteria Comment  Final    Comment: No white blood cells seen.   Organism ID, Bacteria Comment  Final    Comment: Few gram positive cocci   Organism ID, Bacteria Comment  Final    Comment: Few gram negative diplococci.   Aerobic Bacterial Culture Final report (A)  Final   Organism ID, Bacteria Staphylococcus aureus (A)  Final    Comment: Heavy growth Based on susceptibility to oxacillin this isolate would be susceptible to: *Penicillinase-stable penicillins, such as:   Cloxacillin, Dicloxacillin, Nafcillin *Beta-lactam combination agents, such as:   Amoxicillin-clavulanic acid, Ampicillin-sulbactam,   Piperacillin-tazobactam *Oral cephems, such as:   Cefaclor, Cefdinir, Cefpodoxime, Cefprozil, Cefuroxime,   Cephalexin, Loracarbef *Parenteral cephems, such as:   Cefazolin, Cefepime, Cefotaxime, Cefotetan,  Ceftaroline,   Ceftizoxime, Ceftriaxone, Cefuroxime *Carbapenems, such as:   Doripenem, Ertapenem, Imipenem, Meropenem    Organism ID, Bacteria Comment  Final    Comment: Multiple negative rods present, none predominant. This is indicative of a heavily colonized/contaminated specimen site. No further microbiological characterization will be done as it will not provide clinically relevant information. Scant growth    Organism ID, Bacteria Mixed skin flora  Final    Comment: Moderate growth   Antimicrobial Susceptibility Comment  Final    Comment:       ** S = Susceptible; I = Intermediate; R = Resistant **                    P = Positive; N = Negative             MICS are expressed in micrograms per mL    Antibiotic                 RSLT#1    RSLT#2    RSLT#3    RSLT#4 Ciprofloxacin                  S Clindamycin                    S Erythromycin                   S Gentamicin                     S Levofloxacin                   S Linezolid                      S Moxifloxacin                   S Oxacillin                      S Penicillin                     R Quinupristin/Dalfopristin      S Rifampin                       S Tetracycline                   S Trimethoprim/Sulfa             S Vancomycin  S   Culture, blood (routine x 2)     Status: None (Preliminary result)   Collection Time: 01/02/18  2:13 PM  Result Value Ref Range Status   Specimen Description   Final    BLOOD RIGHT WRIST Performed at [facility] [address] [place], [place] [address]    Special Requests   Final    BOTTLES DRAWN AEROBIC AND ANAEROBIC Blood Culture adequate volume Performed at [facility] [address], [place], [place] [address]    Culture   Final    NO GROWTH 3 DAYS Performed at [facility], [place] [address] [place], [place] [address]    Report Status PENDING  Incomplete  Culture, blood (routine x 2)     Status:  None (Preliminary result)   Collection Time: 01/02/18  2:15 PM  Result Value Ref Range Status   Specimen Description   Final    BLOOD LEFT HAND Performed at [facility] [address] [place], [place] [address]    Special Requests   Final    BOTTLES DRAWN AEROBIC AND ANAEROBIC Blood Culture adequate volume Performed at [facility] [address], [place], [place] [address]    Culture   Final    NO GROWTH 3 DAYS Performed at [facility], [place] [address], [place], [place] [address]    Report Status PENDING  Incomplete  MRSA PCR Screening     Status: None   Collection Time: 01/05/18  2:14 PM  Result Value Ref Range Status   MRSA by PCR NEGATIVE NEGATIVE Final    Comment:        The GeneXpert MRSA Assay (FDA approved for NASAL specimens only), is one component of a comprehensive MRSA colonization surveillance program. It is not intended to diagnose MRSA infection nor to guide or monitor treatment for MRSA infections. Performed at [facility], [place] [address] [place],  [address]     Radiology Reports Mr Foot Left Wo Contrast  Result Date: 01/03/2018 CLINICAL DATA:  Left foot pain and swelling in a diabetic patient. Failure to respond to antibiotic treatment. EXAM: MRI OF THE LEFT FOOT WITHOUT CONTRAST TECHNIQUE: Multiplanar, multisequence MR imaging of the left foot was performed. No intravenous contrast was administered. COMPARISON:  Plain films left foot 01/03/2018. FINDINGS: Bones/Joint/Cartilage Bone marrow signal is normal without evidence of osteomyelitis, fracture or other focal lesion. Ligaments Intact. Muscles and Tendons Intermediate increased T2 signal and mild fatty atrophy of intrinsic musculature of the foot are consistent with diabetic myopathy. Soft tissues Subcutaneous edema is present over the dorsum of the foot with scattered locules of gas present, most notable over the third MTP joint.  Subcutaneous edema about the toes is worst in the third toe. No notable joint effusion is identified. IMPRESSION: Extensive subcutaneous soft tissue edema in the dorsum of the foot with locules soft tissue gas as seen on plain films consistent with cellulitis. Negative for osteomyelitis or septic joint. Subcutaneous edema about the toes is worst in the third toe but no abscess is identified on this noncontrast exam. Electronically Signed   By: Inge Rise M.D.   On: 01/03/2018 10:21   Dg Chest Port 1 View  Result Date: 01/05/2018 CLINICAL DATA:  Pneumonia EXAM: PORTABLE CHEST 1 VIEW COMPARISON:  01/04/2018 FINDINGS: Cardiac shadow is enlarged but stable. The lungs are well aerated bilaterally. No focal infiltrate or sizable effusion is seen. No bony abnormality is  noted. IMPRESSION: No acute abnormality seen. Electronically Signed   By: [place] Catalina M.D.   On: 01/05/2018 10:47   Dg Chest Port 1 View  Result Date: 01/04/2018 CLINICAL DATA:  Shortness of Breath EXAM: PORTABLE CHEST 1 VIEW COMPARISON:  01/03/2018 FINDINGS: Cardiac shadow is within normal limits. The lungs are well aerated bilaterally. The degree of vascular congestion has improved in the interval. No sizable effusion or infiltrate is seen. Old rib fractures are noted on the right. IMPRESSION: No acute abnormality noted. Electronically Signed   By: [place] Catalina M.D.   On: 01/04/2018 08:20   Dg Chest Port 1 View  Result Date: 01/03/2018 CLINICAL DATA:  Acute respiratory failure, hypoxia EXAM: PORTABLE CHEST 1 VIEW COMPARISON:  01/03/2018 FINDINGS: Cardiomegaly with vascular congestion. Mild interstitial prominence throughout the lungs could reflect interstitial edema. No confluent opacities or effusions. IMPRESSION: Cardiomegaly with vascular congestion and interstitial prominence, likely interstitial edema. Electronically Signed   By: Rolm Baptise M.D.   On: 01/03/2018 19:34   Dg Chest Port 1 View  Result Date: 01/03/2018 CLINICAL  DATA:  Chest pain EXAM: PORTABLE CHEST 1 VIEW COMPARISON:  11/04/2017 FINDINGS: Interstitial coarsening with Kerley lines. Chronic cardiomegaly. No air bronchogram, effusion, or pneumothorax. IMPRESSION: CHF pattern. Electronically Signed   By: Monte Fantasia M.D.   On: 01/03/2018 09:49   Dg Foot Complete Left  Result Date: 01/02/2018 CLINICAL DATA:  Cellulitis. On antibiotics for 2 days, worsening symptoms. EXAM: LEFT FOOT - COMPLETE 3+ VIEW COMPARISON:  None. FINDINGS: No acute fracture deformity or dislocation. No destructive bony lesions. Moderate plantar calcaneal spur. Calcification at Achilles insertion seen with old injury. Dorsal foot soft tissue swelling with forefoot dorsal subcutaneous gas. IMPRESSION: 1. Soft tissue swelling with dorsal forefoot subcutaneous gas concerning for necrotizing fasciitis. 2. No acute osseous process. Acute findings discussed with and reconfirmed by PA.MICHAEL MACZIS on 01/02/2018 at 1:49 pm. Electronically Signed   By: Elon Alas M.D.   On: 01/02/2018 13:49   Dg Foot Complete Right  Result Date: 12/31/2017 CLINICAL DATA:  foot sweling, eval for subq air, bone destruction, FB. Diabetic EXAM: RIGHT FOOT COMPLETE - 3+ VIEW COMPARISON:  None. FINDINGS: Osseous alignment is normal. Bone mineralization is normal. No fracture line or displaced fracture fragment. No acute or suspicious osseous lesion. No destructive change to suggest osteomyelitis. No significant degenerative change. No soft tissue gas. No foreign body appreciated within the soft tissues. Incidental note made of chronic osseous spurring at the plantar margin of the posterior calcaneus and linear calcifications at the Achilles tendon insertion site suggesting chronic calcific tendinopathy. IMPRESSION: No osseous abnormality.  No soft tissue gas.  No foreign body seen. Electronically Signed   By: Franki Cabot M.D.   On: 12/31/2017 16:38    Time Spent in minutes  35   Donika Butner M.D on  01/06/2018 at 1:14 PM  Between 7am to 7pm - Pager - [phone]  After 7pm go to [email or web address] - password [facility]  [facility] -  Office  [phone]

## 2018-01-06 NOTE — Progress Notes (Signed)
Pt transfer to OR for left foot wound debridement  alert and oriented, telemetry called me for the vtach, asymptomatic, family at the bedside, no complain of apin report given to [place].

## 2018-01-06 NOTE — Progress Notes (Signed)
Pt for left foot wound debridement , NPO midnight with consent, PCR negative, did CHG bath.

## 2018-01-06 NOTE — Anesthesia Procedure Notes (Signed)
Procedure Name: LMA Insertion Date/Time: 01/06/2018 3:18 PM Performed by: Lance Coon, CRNA Pre-anesthesia Checklist: Patient identified, Emergency Drugs available, Suction available, Patient being monitored and Timeout performed Patient Re-evaluated:Patient Re-evaluated prior to induction Oxygen Delivery Method: Circle system utilized Preoxygenation: Pre-oxygenation with 100% oxygen Induction Type: IV induction LMA: LMA inserted LMA Size: 4.0 Number of attempts: 1 Placement Confirmation: positive ETCO2 and breath sounds checked- equal and bilateral Tube secured with: Tape Dental Injury: Teeth and Oropharynx as per pre-operative assessment

## 2018-01-06 NOTE — Anesthesia Preprocedure Evaluation (Signed)
Anesthesia Evaluation  Patient identified by MRN, date of birth, ID band Patient awake    Reviewed: Allergy & Precautions, NPO status , Patient's Chart, lab work & pertinent test results  Airway Mallampati: II  TM Distance: >3 FB Neck ROM: Full    Dental no notable dental hx.    Pulmonary neg pulmonary ROS, asthma , sleep apnea , former smoker,    Pulmonary exam normal breath sounds clear to auscultation       Cardiovascular hypertension, Pt. on medications and Pt. on home beta blockers + CAD, + Cardiac Stents and + Peripheral Vascular Disease  Normal cardiovascular exam+ dysrhythmias Atrial Fibrillation  Rhythm:Regular Rate:Normal     Neuro/Psych Anxiety negative neurological ROS  negative psych ROS   GI/Hepatic negative GI ROS, Neg liver ROS,   Endo/Other  diabetes, Insulin DependentHyperthyroidism   Renal/GU negative Renal ROS  negative genitourinary   Musculoskeletal negative musculoskeletal ROS (+)   Abdominal   Peds negative pediatric ROS (+)  Hematology  (+) anemia ,   Anesthesia Other Findings   Reproductive/Obstetrics negative OB ROS                             Anesthesia Physical Anesthesia Plan  ASA: III  Anesthesia Plan: General   Post-op Pain Management:    Induction: Intravenous  PONV Risk Score and Plan: 3 and Ondansetron, Treatment may vary due to age or medical condition and Midazolam  Airway Management Planned: LMA  Additional Equipment:   Intra-op Plan:   Post-operative Plan: Extubation in OR  Informed Consent: I have reviewed the patients History and Physical, chart, labs and discussed the procedure including the risks, benefits and alternatives for the proposed anesthesia with the patient or authorized representative who has indicated his/her understanding and acceptance.   Dental advisory given  Plan Discussed with: CRNA and Surgeon  Anesthesia  Plan Comments:         Anesthesia Quick Evaluation

## 2018-01-07 ENCOUNTER — Inpatient Hospital Stay (HOSPITAL_COMMUNITY): Payer: Medicare PPO

## 2018-01-07 LAB — GLUCOSE, CAPILLARY
GLUCOSE-CAPILLARY: 214 mg/dL — AB (ref 70–99)
GLUCOSE-CAPILLARY: 227 mg/dL — AB (ref 70–99)
Glucose-Capillary: 224 mg/dL — ABNORMAL HIGH (ref 70–99)
Glucose-Capillary: 326 mg/dL — ABNORMAL HIGH (ref 70–99)

## 2018-01-07 LAB — BASIC METABOLIC PANEL
Anion gap: 8 (ref 5–15)
BUN: 23 mg/dL (ref 8–23)
CO2: 30 mmol/L (ref 22–32)
CREATININE: 1.25 mg/dL — AB (ref 0.44–1.00)
Calcium: 9.3 mg/dL (ref 8.9–10.3)
Chloride: 93 mmol/L — ABNORMAL LOW (ref 98–111)
GFR calc Af Amer: 51 mL/min — ABNORMAL LOW (ref >60)
GFR, EST NON AFRICAN AMERICAN: 44 mL/min — AB (ref >60)
GLUCOSE: 223 mg/dL — AB (ref 70–99)
Potassium: 4.3 mmol/L (ref 3.5–5.1)
SODIUM: 131 mmol/L — AB (ref 135–145)

## 2018-01-07 LAB — CULTURE, BLOOD (ROUTINE X 2)
CULTURE: NO GROWTH
Culture: NO GROWTH
Special Requests: ADEQUATE
Special Requests: ADEQUATE

## 2018-01-07 LAB — CBC
HCT: 29.9 % — ABNORMAL LOW (ref 36.0–46.0)
HEMOGLOBIN: 9.3 g/dL — AB (ref 12.0–15.0)
MCH: 26.7 pg (ref 26.0–34.0)
MCHC: 31.1 g/dL (ref 30.0–36.0)
MCV: 85.9 fL (ref 78.0–100.0)
Platelets: 596 10*3/uL — ABNORMAL HIGH (ref 150–400)
RBC: 3.48 MIL/uL — ABNORMAL LOW (ref 3.87–5.11)
RDW: 14.9 % (ref 11.5–15.5)
WBC: 12.9 10*3/uL — ABNORMAL HIGH (ref 4.0–10.5)

## 2018-01-07 MED ORDER — IPRATROPIUM-ALBUTEROL 0.5-2.5 (3) MG/3ML IN SOLN
3.0000 mL | Freq: Three times a day (TID) | RESPIRATORY_TRACT | Status: DC
  Administered 2018-01-07 (×3): 3 mL via RESPIRATORY_TRACT
  Filled 2018-01-07 (×3): qty 3

## 2018-01-07 MED ORDER — MAGNESIUM CITRATE PO SOLN: 1.0000 | Freq: Once | ORAL | Status: DC | PRN

## 2018-01-07 MED ORDER — ONDANSETRON HCL 4 MG PO TABS: 4.0000 mg | ORAL_TABLET | Freq: Four times a day (QID) | ORAL | Status: DC | PRN

## 2018-01-07 MED ORDER — MORPHINE SULFATE (PF) 2 MG/ML IV SOLN: 0.5000 mg | INTRAVENOUS | Status: DC | PRN

## 2018-01-07 MED ORDER — POLYETHYLENE GLYCOL 3350 17 G PO PACK: 17.0000 g | PACK | Freq: Every day | ORAL | Status: DC | PRN

## 2018-01-07 MED ORDER — POLYETHYLENE GLYCOL 3350 17 G PO PACK
17.0000 g | PACK | Freq: Two times a day (BID) | ORAL | Status: DC
  Administered 2018-01-07 (×2): 17 g via ORAL
  Filled 2018-01-07 (×9): qty 1

## 2018-01-07 MED ORDER — METOCLOPRAMIDE HCL 5 MG/ML IJ SOLN: 5.0000 mg | Freq: Three times a day (TID) | INTRAMUSCULAR | Status: DC | PRN

## 2018-01-07 MED ORDER — BISACODYL 10 MG RE SUPP
10.0000 mg | Freq: Once | RECTAL | Status: AC
  Administered 2018-01-07: 10 mg via RECTAL
  Filled 2018-01-07: qty 1

## 2018-01-07 MED ORDER — METHOCARBAMOL 1000 MG/10ML IJ SOLN
500.0000 mg | Freq: Four times a day (QID) | INTRAVENOUS | Status: DC | PRN
  Filled 2018-01-07: qty 5

## 2018-01-07 MED ORDER — HYDROCODONE-ACETAMINOPHEN 5-325 MG PO TABS: 1.0000 | ORAL_TABLET | ORAL | Status: DC | PRN

## 2018-01-07 MED ORDER — HYDROCODONE-ACETAMINOPHEN 7.5-325 MG PO TABS: 1.0000 | ORAL_TABLET | ORAL | Status: DC | PRN

## 2018-01-07 MED ORDER — METHOCARBAMOL 500 MG PO TABS: 500.0000 mg | ORAL_TABLET | Freq: Four times a day (QID) | ORAL | Status: DC | PRN

## 2018-01-07 MED ORDER — ONDANSETRON HCL 4 MG/2ML IJ SOLN: 4.0000 mg | Freq: Four times a day (QID) | INTRAMUSCULAR | Status: DC | PRN

## 2018-01-07 MED ORDER — SODIUM CHLORIDE 0.9 % IV SOLN
INTRAVENOUS | Status: DC
  Administered 2018-01-07: 09:00:00 via INTRAVENOUS

## 2018-01-07 MED ORDER — METOCLOPRAMIDE HCL 5 MG PO TABS: 5.0000 mg | ORAL_TABLET | Freq: Three times a day (TID) | ORAL | Status: DC | PRN

## 2018-01-07 MED ORDER — BISACODYL 10 MG RE SUPP: 10.0000 mg | Freq: Every day | RECTAL | Status: DC | PRN

## 2018-01-07 MED ORDER — DOCUSATE SODIUM 100 MG PO CAPS
100.0000 mg | ORAL_CAPSULE | Freq: Two times a day (BID) | ORAL | Status: DC
  Administered 2018-01-07 – 2018-01-11 (×9): 100 mg via ORAL
  Filled 2018-01-07 (×9): qty 1

## 2018-01-07 MED ORDER — ACETAMINOPHEN 325 MG PO TABS: 325.0000 mg | ORAL_TABLET | Freq: Four times a day (QID) | ORAL | Status: DC | PRN

## 2018-01-07 NOTE — Progress Notes (Addendum)
PROGRESS NOTE                                                                                                                                                                                                             Patient Demographics:    Judith [name], is a 67 y.o. female, DOB - 1950-09-21, KMM:[id number]  Admit date - 01/02/2018   Admitting Physician Debbe Odea, MD   LOS - 5  Outpatient Specialists: none  Chief Complaint  Patient presents with  . 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 fib on Eliquis status post multiple DCCV in the past, hypertension, hyperlipidemia, CAD with history of PCI to DES, GERD, history of goiter, right foot amputation, OSA on BiPAP who presented to [facility] long hospital with fever and redness with increased swelling of the left foot that started draining clear liquid. Patient was placed on empiric IV antibiotics and orthopedics consulted.  Hospital stay complicated with development of acute respiratory failure with hypoxia secondary to pulmonary edema requiring high flow nasal cannula and BiPAP.  Next line patient transferred to [facility] for I&D of the left foot abscess.   Subjective:   She is breathing better.  Denies abdominal pain, [passin gas. No BM more than 6 days.    Assessment  & Plan :    Principal Problem: Sepsis secondary to left leg cellulitis with abscess Vitals stable.  Continue empiric vancomycin and cefepime.  Pain control and PRN Tylenol. S/P I and D.  Follow culture.   Acute respiratory failure with hypoxia (HCC) Acute diastolic CHF ([place]) Associated with pulmonary edema requiring BiPAP and high flow nasal cannula.   now on 3 L via nasal cannula.   2D echo shows EF of 55-60% with  LVH with grade 2 diastolic dysfunction. Improved on IV lasix,.  Continue with IV lasix.  Negative 3 L.   Constipation;  No BM in several days.  Bowel regimen.  KUB.  Suppository    Type II diabetes mellitus, uncontrolled with nephropathy and polyneuropathy  (HCC) A1c of 8.7.  Continue Lantus at bedtime.  Currently on sliding scale coverage with stable CBG.  Chronic kidney disease stage III Renal  function stable. Follow labs in am.   Coronary artery disease Asymptomatic.  Continue statin.  Permanent A. fib Rate controlled on the monitor.  Continue sotalol and Eliquis.  HR elevated yesterday, received dose of metoprolol   Essential hypertension Continue amlodipine.  Anemia  baseline around 12. Iron low, B 12 454 Start iron supplement.   Hypomagnesemia Replenished  Code Status : Full code  Family Communication  : Sister at bedside  Disposition Plan  :needs PT, will need Rehab  Barriers For Discharge : Active symptoms  Consults  : Dr. Sharol Given  Procedures  : Pending I&D  DVT Prophylaxis  : Eliquis  Lab Results  Component Value Date   PLT 596 (H) 01/07/2018    Antibiotics  :    Anti-infectives (From admission, onward)   Start     Dose/Rate Route Frequency Ordered Stop   01/07/18 1400  vancomycin (VANCOCIN) 1,250 mg in sodium chloride 0.9 % 250 mL IVPB  Status:  Discontinued     1,250 mg 166.7 mL/hr over 90 Minutes Intravenous Every 24 hours 01/06/18 1900 01/06/18 1908   01/07/18 1000  vancomycin (VANCOCIN) IVPB 750 mg/150 ml premix     750 mg 150 mL/hr over 60 Minutes Intravenous Every 12 hours 01/06/18 1908     01/06/18 0600  ceFAZolin (ANCEF) IVPB 2g/100 mL premix  Status:  Discontinued     2 g 200 mL/hr over 30 Minutes Intravenous To [facility] 01/05/18 1945 01/06/18 1716   01/03/18 1800  vancomycin (VANCOCIN) IVPB 750 mg/150 ml premix  Status:  Discontinued     750 mg 150 mL/hr over 60 Minutes Intravenous Every 24 hours 01/02/18 1712 01/03/18  1135   01/03/18 1800  vancomycin (VANCOCIN) IVPB 1000 mg/200 mL premix  Status:  Discontinued     1,000 mg 200 mL/hr over 60 Minutes Intravenous Every 24 hours 01/03/18 1135 01/06/18 1900   01/03/18 1800  ceFEPIme (MAXIPIME) 2 g in sodium chloride 0.9 % 100 mL IVPB     2 g 200 mL/hr over 30 Minutes Intravenous Every 12 hours 01/03/18 1142     01/03/18 0000  ceFEPIme (MAXIPIME) 1 g in sodium chloride 0.9 % 100 mL IVPB  Status:  Discontinued     1 g 200 mL/hr over 30 Minutes Intravenous Every 8 hours 01/02/18 1712 01/03/18 1142   01/02/18 1645  vancomycin (VANCOCIN) 1,500 mg in sodium chloride 0.9 % 500 mL IVPB     1,500 mg 250 mL/hr over 120 Minutes Intravenous STAT 01/02/18 1639 01/02/18 1941   01/02/18 1415  ceFEPIme (MAXIPIME) 2 g in sodium chloride 0.9 % 100 mL IVPB     2 g 200 mL/hr over 30 Minutes Intravenous  Once 01/02/18 1406 01/02/18 1619   01/02/18 1415  piperacillin-tazobactam (ZOSYN) IVPB 3.375 g     3.375 g 100 mL/hr over 30 Minutes Intravenous  Once 01/02/18 1406 01/02/18 1550        Objective:   Vitals:   01/07/18 0441 01/07/18 0730 01/07/18 1325 01/07/18 1503  BP: (!) 142/60  115/61   Pulse: 60  (!) 57   Resp: 16  15   Temp: 98.7 F (37.1 C)  97.9 F (36.6 C)   TempSrc: Oral  Oral   SpO2: 94% 95% 92% (!) 87%  Weight:      Height:        Wt Readings from Last 3 Encounters:  01/06/18 80.1 kg  12/31/17 79.4 kg  12/12/17 79.5 kg     Intake/Output  Summary (Last 24 hours) at 01/07/2018 1825 Last data filed at 01/07/2018 0931 Gross per 24 hour  Intake 896.92 ml  Output 1550 ml  Net -653.08 ml     Physical Exam  Gen: NAD Chest: Bilateral air movement, bilateral crackles.  CVS: S 1, S 2 RRR GI: BS present, soft, nt Musculoskeletal: LE with dressing.      Data Review:    CBC Recent Labs  Lab 01/02/18 1413 01/03/18 0536 01/04/18 0331 01/05/18 0321 01/07/18 0452  WBC 16.9* 15.0* 14.2* 14.5* 12.9*  HGB 9.7* 9.0* 9.2* 8.4* 9.3*  HCT 29.8*  28.1* 28.7* 25.9* 29.9*  PLT 452* 428* 499* 544* 596*  MCV 83.7 85.4 85.2 84.6 85.9  MCH 27.2 27.4 27.3 27.5 26.7  MCHC 32.6 32.0 32.1 32.4 31.1  RDW 14.5 14.6 14.6 15.0 14.9  LYMPHSABS 1.2  --   --  1.4  --   MONOABS 1.2*  --   --  1.1*  --   EOSABS 0.0  --   --  0.2  --   BASOSABS 0.0  --   --  0.0  --     Chemistries  Recent Labs  Lab 01/02/18 1413 01/03/18 0536 01/04/18 0331 01/05/18 0321 01/07/18 0452  NA 135 133* 138 137 131*  K 3.5 3.7 4.1 3.6 4.3  CL 93* 97* 99 99 93*  CO2 28 26 28 30 30   GLUCOSE 204* 231* 167* 122* 223*  BUN 21 19 26* 22 23  CREATININE 1.03* 0.99 1.01* 1.06* 1.25*  CALCIUM 9.3 8.7* 9.1 9.1 9.3  MG  --   --   --  1.6*  --    ------------------------------------------------------------------------------------------------------------------ No results for input(s): CHOL, HDL, LDLCALC, TRIG, CHOLHDL, LDLDIRECT in the last 72 hours.  Lab Results  Component Value Date   HGBA1C 8.7 (A) 12/05/2017   ------------------------------------------------------------------------------------------------------------------ No results for input(s): TSH, T4TOTAL, T3FREE, THYROIDAB in the last 72 hours.  Invalid input(s): FREET3 ------------------------------------------------------------------------------------------------------------------ Recent Labs    01/06/18 1345  VITAMINB12 454  FERRITIN 124  TIBC 256  IRON 14*    Coagulation profile No results for input(s): INR, PROTIME in the last 168 hours.  No results for input(s): DDIMER in the last 72 hours.  Cardiac Enzymes Recent Labs  Lab 01/03/18 0942  TROPONINI 0.03*   ------------------------------------------------------------------------------------------------------------------    Component Value Date/Time   BNP 62.6 11/15/2015 0629    Inpatient Medications  Scheduled Meds: . amLODipine  5 mg Oral Daily  . apixaban  5 mg Oral BID  . atorvastatin  20 mg Oral QPM  . bisacodyl  10 mg  Rectal Once  . buPROPion  150 mg Oral BID  . chlorhexidine  15 mL Mouth Rinse BID  . docusate sodium  100 mg Oral BID  . fenofibrate  160 mg Oral Daily  . FLUoxetine  20 mg Oral QHS  . furosemide  60 mg Intravenous Q12H  . gabapentin  600 mg Oral BID  . insulin glargine  20 Units Subcutaneous QHS  . insulin lispro  0-5 Units Subcutaneous QHS  . insulin lispro  0-9 Units Subcutaneous TID WC  . ipratropium-albuterol  3 mL Nebulization TID  . magnesium oxide  400 mg Oral Daily  . mouth rinse  15 mL Mouth Rinse q12n4p  . methimazole  10 mg Oral Daily  . metoprolol succinate  50 mg Oral BID  . pantoprazole  40 mg Oral Daily  . polyethylene glycol  17 g Oral BID  . potassium chloride  SA  20 mEq Oral BID  . sodium chloride flush  3 mL Intravenous Q12H  . sotalol  120 mg Oral Q12H   Continuous Infusions: . sodium chloride    . sodium chloride Stopped (01/07/18 0931)  . ceFEPime (MAXIPIME) IV Stopped (01/07/18 0549)  . lactated ringers Stopped (01/06/18 1540)  . lactated ringers 10 mL/hr at 01/07/18 0900  . methocarbamol (ROBAXIN) IV    . vancomycin 750 mg (01/07/18 0931)   PRN Meds:.sodium chloride, acetaminophen **OR** acetaminophen, albuterol, HYDROcodone-acetaminophen, HYDROcodone-acetaminophen, magnesium citrate, methocarbamol **OR** methocarbamol (ROBAXIN) IV, metoCLOPramide **OR** metoCLOPramide (REGLAN) injection, metoprolol tartrate, morphine injection, morphine injection, ondansetron **OR** ondansetron (ZOFRAN) IV, sodium chloride flush, traMADol  Micro Results Recent Results (from the past 240 hour(s))  WOUND CULTURE     Status: Abnormal   Collection Time: 12/31/17  4:40 PM  Result Value Ref Range Status   Gram Stain Result Final report  Final   Organism ID, Bacteria Comment  Final    Comment: No white blood cells seen.   Organism ID, Bacteria Comment  Final    Comment: Few gram positive cocci   Organism ID, Bacteria Comment  Final    Comment: Few gram negative  diplococci.   Aerobic Bacterial Culture Final report (A)  Final   Organism ID, Bacteria Staphylococcus aureus (A)  Final    Comment: Heavy growth Based on susceptibility to oxacillin this isolate would be susceptible to: *Penicillinase-stable penicillins, such as:   Cloxacillin, Dicloxacillin, Nafcillin *Beta-lactam combination agents, such as:   Amoxicillin-clavulanic acid, Ampicillin-sulbactam,   Piperacillin-tazobactam *Oral cephems, such as:   Cefaclor, Cefdinir, Cefpodoxime, Cefprozil, Cefuroxime,   Cephalexin, Loracarbef *Parenteral cephems, such as:   Cefazolin, Cefepime, Cefotaxime, Cefotetan, Ceftaroline,   Ceftizoxime, Ceftriaxone, Cefuroxime *Carbapenems, such as:   Doripenem, Ertapenem, Imipenem, Meropenem    Organism ID, Bacteria Comment  Final    Comment: Multiple negative rods present, none predominant. This is indicative of a heavily colonized/contaminated specimen site. No further microbiological characterization will be done as it will not provide clinically relevant information. Scant growth    Organism ID, Bacteria Mixed skin flora  Final    Comment: Moderate growth   Antimicrobial Susceptibility Comment  Final    Comment:       ** S = Susceptible; I = Intermediate; R = Resistant **                    P = Positive; N = Negative             MICS are expressed in micrograms per mL    Antibiotic                 RSLT#1    RSLT#2    RSLT#3    RSLT#4 Ciprofloxacin                  S Clindamycin                    S Erythromycin                   S Gentamicin                     S Levofloxacin                   S Linezolid                      S Moxifloxacin  S Oxacillin                      S Penicillin                     R Quinupristin/Dalfopristin      S Rifampin                       S Tetracycline                   S Trimethoprim/Sulfa             S Vancomycin                     S   Culture, blood (routine x 2)     Status: None    Collection Time: 01/02/18  2:13 PM  Result Value Ref Range Status   Specimen Description   Final    BLOOD RIGHT WRIST Performed at [facility] [address], [place], [place] [address]    Special Requests   Final    BOTTLES DRAWN AEROBIC AND ANAEROBIC Blood Culture adequate volume Performed at [facility] [address] [place], [place] [address]    Culture   Final    NO GROWTH 5 DAYS Performed at [facility], [place] [address], [place], [place] [address]    Report Status 01/07/2018 FINAL  Final  Culture, blood (routine x 2)     Status: None   Collection Time: 01/02/18  2:15 PM  Result Value Ref Range Status   Specimen Description   Final    BLOOD LEFT HAND Performed at [facility] [address], [place], [place] [address]    Special Requests   Final    BOTTLES DRAWN AEROBIC AND ANAEROBIC Blood Culture adequate volume Performed at [facility] [address] [place], [place] [address]    Culture   Final    NO GROWTH 5 DAYS Performed at [facility], [place] [address], [place], [place] [address]    Report Status 01/07/2018 FINAL  Final  MRSA PCR Screening     Status: None   Collection Time: 01/05/18  2:14 PM  Result Value Ref Range Status   MRSA by PCR NEGATIVE NEGATIVE Final    Comment:        The GeneXpert MRSA Assay (FDA approved for NASAL specimens only), is one component of a comprehensive MRSA colonization surveillance program. It is not intended to diagnose MRSA infection nor to guide or monitor treatment for MRSA infections. Performed at [facility], [place] [address], [place],  [address]   Aerobic/Anaerobic Culture (surgical/deep wound)     Status: None (Preliminary result)   Collection Time: 01/06/18  4:13 PM  Result Value Ref Range Status   Specimen Description TISSUE LEFT FOOT ULCER  Final   Special Requests NONE  Final   Gram Stain    Final    MODERATE WBC PRESENT, PREDOMINANTLY PMN RARE GRAM POSITIVE COCCI    Culture   Final    CULTURE REINCUBATED FOR BETTER GROWTH Performed at [facility], [place] [address], [place],  [address]    Report Status PENDING  Incomplete    Radiology Reports Dg Abd 1 View  Result Date: 01/07/2018 CLINICAL DATA:  67 year old female with an 8 day history of constipation EXAM: ABDOMEN - 1  VIEW COMPARISON:  CT scan of the abdomen and pelvis 08/07/2017 FINDINGS: The bowel gas pattern is not obstructed. The colonic stool burden is unremarkable. The stomach is mildly distended with gas. No acute osseous abnormality. Small amount of high density material is noted within a diverticulum along the medial margin of the descending colon. IMPRESSION: Negative. Electronically Signed   By: Jacqulynn Cadet M.D.   On: 01/07/2018 15:41   Mr Foot Left Wo Contrast  Result Date: 01/03/2018 CLINICAL DATA:  Left foot pain and swelling in a diabetic patient. Failure to respond to antibiotic treatment. EXAM: MRI OF THE LEFT FOOT WITHOUT CONTRAST TECHNIQUE: Multiplanar, multisequence MR imaging of the left foot was performed. No intravenous contrast was administered. COMPARISON:  Plain films left foot 01/03/2018. FINDINGS: Bones/Joint/Cartilage Bone marrow signal is normal without evidence of osteomyelitis, fracture or other focal lesion. Ligaments Intact. Muscles and Tendons Intermediate increased T2 signal and mild fatty atrophy of intrinsic musculature of the foot are consistent with diabetic myopathy. Soft tissues Subcutaneous edema is present over the dorsum of the foot with scattered locules of gas present, most notable over the third MTP joint. Subcutaneous edema about the toes is worst in the third toe. No notable joint effusion is identified. IMPRESSION: Extensive subcutaneous soft tissue edema in the dorsum of the foot with locules soft tissue gas as seen on plain films consistent with cellulitis.  Negative for osteomyelitis or septic joint. Subcutaneous edema about the toes is worst in the third toe but no abscess is identified on this noncontrast exam. Electronically Signed   By: Inge Rise M.D.   On: 01/03/2018 10:21   Dg Chest Port 1 View  Result Date: 01/05/2018 CLINICAL DATA:  Pneumonia EXAM: PORTABLE CHEST 1 VIEW COMPARISON:  01/04/2018 FINDINGS: Cardiac shadow is enlarged but stable. The lungs are well aerated bilaterally. No focal infiltrate or sizable effusion is seen. No bony abnormality is noted. IMPRESSION: No acute abnormality seen. Electronically Signed   By: Inez Catalina M.D.   On: 01/05/2018 10:47   Dg Chest Port 1 View  Result Date: 01/04/2018 CLINICAL DATA:  Shortness of Breath EXAM: PORTABLE CHEST 1 VIEW COMPARISON:  01/03/2018 FINDINGS: Cardiac shadow is within normal limits. The lungs are well aerated bilaterally. The degree of vascular congestion has improved in the interval. No sizable effusion or infiltrate is seen. Old rib fractures are noted on the right. IMPRESSION: No acute abnormality noted. Electronically Signed   By: Inez Catalina M.D.   On: 01/04/2018 08:20   Dg Chest Port 1 View  Result Date: 01/03/2018 CLINICAL DATA:  Acute respiratory failure, hypoxia EXAM: PORTABLE CHEST 1 VIEW COMPARISON:  01/03/2018 FINDINGS: Cardiomegaly with vascular congestion. Mild interstitial prominence throughout the lungs could reflect interstitial edema. No confluent opacities or effusions. IMPRESSION: Cardiomegaly with vascular congestion and interstitial prominence, likely interstitial edema. Electronically Signed   By: Rolm Baptise M.D.   On: 01/03/2018 19:34   Dg Chest Port 1 View  Result Date: 01/03/2018 CLINICAL DATA:  Chest pain EXAM: PORTABLE CHEST 1 VIEW COMPARISON:  11/04/2017 FINDINGS: Interstitial coarsening with Kerley lines. Chronic cardiomegaly. No air bronchogram, effusion, or pneumothorax. IMPRESSION: CHF pattern. Electronically Signed   By: Monte Fantasia  M.D.   On: 01/03/2018 09:49   Dg Foot Complete Left  Result Date: 01/02/2018 CLINICAL DATA:  Cellulitis. On antibiotics for 2 days, worsening symptoms. EXAM: LEFT FOOT - COMPLETE 3+ VIEW COMPARISON:  None. FINDINGS: No acute fracture deformity or dislocation. No destructive bony lesions. Moderate plantar  calcaneal spur. Calcification at Achilles insertion seen with old injury. Dorsal foot soft tissue swelling with forefoot dorsal subcutaneous gas. IMPRESSION: 1. Soft tissue swelling with dorsal forefoot subcutaneous gas concerning for necrotizing fasciitis. 2. No acute osseous process. Acute findings discussed with and reconfirmed by PA.MICHAEL MACZIS on 01/02/2018 at 1:49 pm. Electronically Signed   By: Elon Alas M.D.   On: 01/02/2018 13:49   Dg Foot Complete Right  Result Date: 12/31/2017 CLINICAL DATA:  foot sweling, eval for subq air, bone destruction, FB. Diabetic EXAM: RIGHT FOOT COMPLETE - 3+ VIEW COMPARISON:  None. FINDINGS: Osseous alignment is normal. Bone mineralization is normal. No fracture line or displaced fracture fragment. No acute or suspicious osseous lesion. No destructive change to suggest osteomyelitis. No significant degenerative change. No soft tissue gas. No foreign body appreciated within the soft tissues. Incidental note made of chronic osseous spurring at the plantar margin of the posterior calcaneus and linear calcifications at the Achilles tendon insertion site suggesting chronic calcific tendinopathy. IMPRESSION: No osseous abnormality.  No soft tissue gas.  No foreign body seen. Electronically Signed   By: Franki Cabot M.D.   On: 12/31/2017 16:38    Time Spent in minutes  35   Bransyn Adami A Sheelah Ritacco M.D on 01/07/2018 at 6:25 PM  Between 7am to 7pm - Pager - [phone]  After 7pm go to [email or web address] - password [facility]  [facility] -  Office  [phone]

## 2018-01-07 NOTE — Progress Notes (Signed)
Water added to pt's home CPAP per pt's request. No other needs at this time. Advised pt to notify for RT if any further assistance is required.

## 2018-01-07 NOTE — Progress Notes (Signed)
Orthopedic Tech Progress Note Patient Details:  Judith [name] 1950-10-30 [id number]  Ortho Devices Type of Ortho Device: Postop shoe/boot Ortho Device/Splint Location: lle Ortho Device/Splint Interventions: Application   Post Interventions Patient Tolerated: Well Instructions Provided: Care of device   Hildred Priest 01/07/2018, 10:58 AM

## 2018-01-08 LAB — CBC
HCT: 29.5 % — ABNORMAL LOW (ref 36.0–46.0)
HEMOGLOBIN: 9.1 g/dL — AB (ref 12.0–15.0)
MCH: 26.8 pg (ref 26.0–34.0)
MCHC: 30.8 g/dL (ref 30.0–36.0)
MCV: 86.8 fL (ref 78.0–100.0)
Platelets: 614 10*3/uL — ABNORMAL HIGH (ref 150–400)
RBC: 3.4 MIL/uL — ABNORMAL LOW (ref 3.87–5.11)
RDW: 14.6 % (ref 11.5–15.5)
WBC: 12.1 10*3/uL — ABNORMAL HIGH (ref 4.0–10.5)

## 2018-01-08 LAB — BASIC METABOLIC PANEL
Anion gap: 9 (ref 5–15)
BUN: 23 mg/dL (ref 8–23)
CHLORIDE: 95 mmol/L — AB (ref 98–111)
CO2: 29 mmol/L (ref 22–32)
Calcium: 9.2 mg/dL (ref 8.9–10.3)
Creatinine, Ser: 1.18 mg/dL — ABNORMAL HIGH (ref 0.44–1.00)
GFR calc Af Amer: 55 mL/min — ABNORMAL LOW (ref >60)
GFR, EST NON AFRICAN AMERICAN: 47 mL/min — AB (ref >60)
Glucose, Bld: 196 mg/dL — ABNORMAL HIGH (ref 70–99)
Potassium: 4.6 mmol/L (ref 3.5–5.1)
SODIUM: 133 mmol/L — AB (ref 135–145)

## 2018-01-08 LAB — GLUCOSE, CAPILLARY
GLUCOSE-CAPILLARY: 260 mg/dL — AB (ref 70–99)
Glucose-Capillary: 175 mg/dL — ABNORMAL HIGH (ref 70–99)
Glucose-Capillary: 226 mg/dL — ABNORMAL HIGH (ref 70–99)

## 2018-01-08 MED ORDER — FUROSEMIDE 40 MG PO TABS
40.0000 mg | ORAL_TABLET | Freq: Two times a day (BID) | ORAL | Status: DC
  Administered 2018-01-08 – 2018-01-11 (×6): 40 mg via ORAL
  Filled 2018-01-08 (×6): qty 1

## 2018-01-08 NOTE — Evaluation (Addendum)
Occupational Therapy Evaluation Patient Details Name: Judith [name] MRN: [id number] DOB: 1951-04-19 Today's Date: 01/08/2018    History of Present Illness Pt presented to [facility] with sepsis due to lt foot abscess. Pt developed acute respiratory failure due to pulmonary edema and was placed on bipap and transferred to [facility]. Pt underwent I&D of lt foot on 01/06/18. PMH - DM, CKD, HTN, CAD, rt transmetatarsal amputation, afib, rt shoulder surgery   Clinical Impression   PTA, pt was living with her sister and reports she performed ADLs and light IADLs; sister not present to confirm information. Pt presenting with decreased cognition including ST memory, problem solving, and attention. Pt very pleasant and agreeable to therapy. Pt requiring Min A for LB ADLs with lateral leans and Min A for lateral scoot to drop arm recliner. Pt presenting with poor adherence to WB status and required assistance throughout to maintain NWB. Pt would benefit from further acute OT to facilitate safe dc. Recommend dc to SNF for further OT to optimize safety, independence with ADLs, and return to PLOF.      Follow Up Recommendations  SNF    Equipment Recommendations  Other (comment)(Defer to next venue)    Recommendations for Other Services PT consult     Precautions / Restrictions Precautions Precautions: Fall Required Braces or Orthoses: Other Brace/Splint Other Brace/Splint: post op shoe lt foot Restrictions Weight Bearing Restrictions: Yes LLE Weight Bearing: Non weight bearing      Mobility Bed Mobility Overal bed mobility: Needs Assistance Bed Mobility: Supine to Sit     Supine to sit: Supervision;HOB elevated     General bed mobility comments: Supervision for safety and HOB elevated. Pt presentign with decreased problem solving but able to bring BLEs to EOB and pull on bed rails for elevating trunk.   Transfers Overall transfer level: Needs assistance Equipment used: Rolling  walker (2 wheeled) Transfers: Lateral/Scoot Transfers          Lateral/Scoot Transfers: Min assist General transfer comment: Min A for elevating RLE and prevent wbing through RLE.     Balance Overall balance assessment: Needs assistance Sitting-balance support: No upper extremity supported;Feet supported Sitting balance-Leahy Scale: Good                                     ADL either performed or assessed with clinical judgement   ADL Overall ADL's : Needs assistance/impaired Eating/Feeding: Set up;Supervision/ safety;Sitting   Grooming: Set up;Supervision/safety;Sitting   Upper Body Bathing: Set up;Supervision/ safety;Sitting   Lower Body Bathing: Minimal assistance;Sitting/lateral leans   Upper Body Dressing : Set up;Supervision/safety;Sitting   Lower Body Dressing: Minimal assistance;Sitting/lateral leans   Toilet Transfer: Minimal assistance(Lateral scoot to drop arm recliner) Toilet Transfer Details (indicate cue type and reason): Min A for maintaining NWB through RLE         Functional mobility during ADLs: Minimal assistance(Lateral scoot to drop arm recliner) General ADL Comments: Pt requiring assistance for LB ADLs and lateral scoot to drop arm recliner. Pt decreased adherance to maintain NWB status through RLE.      Vision         Perception     Praxis      Pertinent Vitals/Pain Pain Assessment: Faces Faces Pain Scale: Hurts a little bit Pain Location: LLE/foot Pain Descriptors / Indicators: Discomfort Pain Intervention(s): Monitored during session     Hand Dominance Right   Extremity/Trunk Assessment Upper Extremity  Assessment Upper Extremity Assessment: Overall WFL for tasks assessed;RUE deficits/detail RUE Deficits / Details: Pt reporting that she feels weak in her right hand and states "I find myself not wanting to her my right hand and that is scary." Encouraged pt to use her RUE during ADLs. Pt able to perform finger  opposition with increased time.   Lower Extremity Assessment Lower Extremity Assessment: Generalized weakness;RLE deficits/detail;LLE deficits/detail RLE Deficits / Details: transmetatarsal amputation LLE Deficits / Details: soft dressing for foot/ankle   Cervical / Trunk Assessment Cervical / Trunk Assessment: Normal   Communication Communication Communication: No difficulties   Cognition Arousal/Alertness: Awake/alert Behavior During Therapy: WFL for tasks assessed/performed Overall Cognitive Status: History of cognitive impairments - at baseline                                 General Comments: Pt presenting with decreased ST memory, problem solving, and attention. Throughout conversation, pt presents with tangential thoughts. Feel pt is near baseline cognition   General Comments  SpO2 85% on RA. placing pt back on 2L O2.     Exercises     Shoulder Instructions      Home Living Family/patient expects to be discharged to:: Private residence Living Arrangements: Other relatives(sister) Available Help at Discharge: Family;Available PRN/intermittently Type of Home: House Home Access: Ramped entrance     Home Layout: Two level;Laundry or work area in [place] Shower/Tub: Occupational psychologist: [place]: Environmental consultant - 2 wheels;Wheelchair - manual;Bedside commode(BSC used as shower)          Prior Functioning/Environment Level of Independence: Independent        Comments: ADLs and light IADls; sister helps with IADLs        OT Problem List: Decreased strength;Decreased range of motion;Decreased activity tolerance;Impaired balance (sitting and/or standing);Decreased knowledge of use of DME or AE;Decreased knowledge of precautions;Pain      OT Treatment/Interventions: Self-care/ADL training;Therapeutic exercise;Energy conservation;DME and/or AE instruction;Therapeutic activities;Patient/family education    OT  Goals(Current goals can be found in the care plan section) Acute Rehab OT Goals Patient Stated Goal: get foot better OT Goal Formulation: With patient Time For Goal Achievement: 01/22/18 Potential to Achieve Goals: Good ADL Goals Pt Will Perform Lower Body Dressing: with set-up;with supervision;sitting/lateral leans Pt Will Transfer to Toilet: with set-up;with supervision;bedside commode;stand pivot transfer Pt Will Perform Toileting - Clothing Manipulation and hygiene: with set-up;with supervision;sit to/from stand  OT Frequency: Min 2X/week   Barriers to D/C:            Co-evaluation              AM-PAC PT "6 Clicks" Daily Activity     Outcome Measure Help from another person eating meals?: None Help from another person taking care of personal grooming?: None Help from another person toileting, which includes using toliet, bedpan, or urinal?: A Little Help from another person bathing (including washing, rinsing, drying)?: A Little Help from another person to put on and taking off regular upper body clothing?: None Help from another person to put on and taking off regular lower body clothing?: A Little 6 Click Score: 21   End of Session Equipment Utilized During Treatment: Gait belt;Other (comment)(Post-op shoe) Nurse Communication: Mobility status  Activity Tolerance: Patient tolerated treatment well Patient left: in chair;with call bell/phone within reach;with chair alarm set  OT Visit Diagnosis:  Unsteadiness on feet (R26.81);Other abnormalities of gait and mobility (R26.89);Muscle weakness (generalized) (M62.81);Pain Pain - Right/Left: Left Pain - part of body: Ankle and joints of foot                Time: 7867-5449 OT Time Calculation (min): 19 min Charges:  OT General Charges $OT Visit: 1 Visit OT Evaluation $OT Eval Moderate Complexity: [place], OTR/L Acute Rehab Pager: [phone] Office: [place] 01/08/2018,  4:17 PM

## 2018-01-08 NOTE — Evaluation (Signed)
Physical Therapy Evaluation Patient Details Name: Judith [name] MRN: [id number] DOB: 06-18-1950 Today's Date: 01/08/2018   History of Present Illness  Pt presented to [facility] with sepsis due to lt foot abscess. Pt developed acute respiratory failure due to pulmonary edema and was placed on bipap and transferred to [facility]. Pt underwent I&D of lt foot on 01/06/18. PMH - DM, CKD, HTN, CAD, rt transmetatarsal amputation, afib, rt shoulder surgery  Clinical Impression  Pt admitted with above diagnosis and presents to PT with functional limitations due to deficits listed below (See PT problem list). Pt needs skilled PT to maximize independence and safety to allow discharge to ST-SNF for further rehab prior to return home with sister. Expect pt will make steady progress. Will likely be limited to transfers and w/c level activities primarily while NWB on lt.     Follow Up Recommendations SNF    Equipment Recommendations  Other (comment)(To be determined at next venue)    Recommendations for Other Services       Precautions / Restrictions Precautions Precautions: Fall Required Braces or Orthoses: Other Brace/Splint Other Brace/Splint: post op shoe lt foot Restrictions Weight Bearing Restrictions: Yes LLE Weight Bearing: Non weight bearing      Mobility  Bed Mobility               General bed mobility comments: Pt sitting EOB  Transfers Overall transfer level: Needs assistance Equipment used: Rolling walker (2 wheeled) Transfers: Sit to/from [employer] Sit to Stand: +2 physical assistance;Mod assist   Squat pivot transfers: Min assist     General transfer comment: For squat pivot pt required min assist but not maintaining NWB. For sit to stand with walker assist to bring hips up and for balance. Difficulty maintaining NWB during transition of sit to stand  Ambulation/Gait             General Gait Details: Did not attempt due to pt not able to  maintaing NWB and hop on rt foot   Stairs            Wheelchair Mobility    Modified Rankin (Stroke Patients Only)       Balance Overall balance assessment: Needs assistance Sitting-balance support: No upper extremity supported;Feet supported Sitting balance-Leahy Scale: Good     Standing balance support: Bilateral upper extremity supported Standing balance-Leahy Scale: Poor Standing balance comment: Pt stood x 2 with rolling walker for 1-2 minutes with min assist including verbal/tactile cues to keep lt foot off of the floor                             Pertinent Vitals/Pain Pain Assessment: No/denies pain    Home Living Family/patient expects to be discharged to:: Private residence Living Arrangements: Other relatives(sister) Available Help at Discharge: Family;Available PRN/intermittently Type of Home: House Home Access: Ramped entrance     Home Layout: Two level;Laundry or work area in [place]: Environmental consultant - 2 wheels;Wheelchair - manual;Bedside commode      Prior Function Level of Independence: Independent               Hand Dominance        Extremity/Trunk Assessment   Upper Extremity Assessment Upper Extremity Assessment: Defer to OT evaluation    Lower Extremity Assessment Lower Extremity Assessment: Generalized weakness;RLE deficits/detail;LLE deficits/detail RLE Deficits / Details: transmetatarsal amputation LLE Deficits / Details: soft dressing for foot/ankle  Communication   Communication: No difficulties  Cognition Arousal/Alertness: Awake/alert Behavior During Therapy: Impulsive Overall Cognitive Status: History of cognitive impairments - at baseline                                 General Comments: Looks to sister to answer many questions.      General Comments      Exercises     Assessment/Plan    PT Assessment Patient needs continued PT services  PT Problem List Decreased  strength;Decreased activity tolerance;Decreased balance;Decreased mobility;Decreased safety awareness;Decreased knowledge of use of DME       PT Treatment Interventions DME instruction;Gait training;Functional mobility training;Therapeutic exercise;Therapeutic activities;Balance training;Patient/family education    PT Goals (Current goals can be found in the Care Plan section)  Acute Rehab PT Goals Patient Stated Goal: get foot better PT Goal Formulation: With patient/family Time For Goal Achievement: 01/22/18 Potential to Achieve Goals: Good    Frequency Min 3X/week   Barriers to discharge Decreased caregiver support doesn't have 24 hours assist    Co-evaluation               AM-PAC PT "6 Clicks" Daily Activity  Outcome Measure Difficulty turning over in bed (including adjusting bedclothes, sheets and blankets)?: A Little Difficulty moving from lying on back to sitting on the side of the bed? : A Little Difficulty sitting down on and standing up from a chair with arms (e.g., wheelchair, bedside commode, etc,.)?: Unable Help needed moving to and from a bed to chair (including a wheelchair)?: A Lot Help needed walking in hospital room?: Total Help needed climbing 3-5 steps with a railing? : Total 6 Click Score: 11    End of Session Equipment Utilized During Treatment: Gait belt Activity Tolerance: Patient tolerated treatment well Patient left: in chair;with call bell/phone within reach;with chair alarm set;with family/visitor present Nurse Communication: Mobility status(nurse tech) PT Visit Diagnosis: Other abnormalities of gait and mobility (R26.89);Unsteadiness on feet (R26.81);History of falling (Z91.81)    Time: 4650-3546 PT Time Calculation (min) (ACUTE ONLY): 18 min   Charges:   PT Evaluation $PT Eval Moderate Complexity: [place] Pager [phone] Office [place] 01/08/2018,  11:40 AM

## 2018-01-08 NOTE — Plan of Care (Signed)
  Problem: Health Behavior/Discharge Planning: Goal: Ability to manage health-related needs will improve Outcome: Progressing   Problem: Activity: Goal: Risk for activity intolerance will decrease Outcome: Progressing   Problem: Nutrition: Goal: Adequate nutrition will be maintained Outcome: Progressing   Problem: Elimination: Goal: Will not experience complications related to bowel motility Outcome: Progressing   

## 2018-01-08 NOTE — Progress Notes (Signed)
PROGRESS NOTE                                                                                                                                                                                                             Patient Demographics:    Judith [name], is a 67 y.o. female, DOB - 02-01-1951, WCB:[id number]  Admit date - 01/02/2018   Admitting Physician Debbe Odea, MD   LOS - 6  Outpatient Specialists: none  Chief Complaint  Patient presents with  . 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 fib on Eliquis status post multiple DCCV in the past, hypertension, hyperlipidemia, CAD with history of PCI to DES, GERD, history of goiter, right foot amputation, OSA on BiPAP who presented to [facility] with fever and redness with increased swelling of the left foot that started draining clear liquid. Patient was placed on empiric IV antibiotics and orthopedics consulted.  Hospital stay complicated with development of acute respiratory failure with hypoxia secondary to pulmonary edema requiring high flow nasal cannula and BiPAP.  Next line patient transferred to [facility] for I&D of the left foot abscess.   Subjective:   She is breathing better.  Denies abdominal pain, [passin gas. No BM more than 6 days.    Assessment  & Plan :    Principal Problem: Sepsis secondary to left leg cellulitis with abscess Vitals stable.  Continue empiric vancomycin and cefepime.  Pain control and PRN Tylenol. S/P I and D.  Follow culture. Pending   Acute respiratory failure with hypoxia (HCC) Acute diastolic CHF ([place]) Associated with pulmonary edema requiring BiPAP and high flow nasal cannula.   now on 3 L via nasal cannula.   2D echo shows EF of 55-60%  with LVH with grade 2 diastolic dysfunction. Improved on IV lasix,.  Change lasix to oral.  Negative 3 L.   Constipation;  No BM in several days.  Bowel regimen.  KUB negative.  She had several Bowel movement.    Type II diabetes mellitus, uncontrolled with nephropathy and polyneuropathy  (HCC) A1c of 8.7.  Continue Lantus at bedtime.  Currently on sliding scale coverage with stable CBG.  Chronic kidney disease stage III Renal function stable. Follow labs in am.   Coronary artery disease Asymptomatic.  Continue statin.  Permanent A. fib Rate controlled on the monitor.  Continue sotalol and Eliquis.  HR elevated yesterday, received dose of metoprolol   Essential hypertension Continue amlodipine.  Anemia  baseline around 12. Iron low, B 12 454 Start iron supplement.   Hypomagnesemia Replenished  Code Status : Full code  Family Communication  : Sister at bedside  Disposition Plan  :needs PT, will need Rehab  Barriers For Discharge : Active symptoms  Consults  : Dr. Sharol Given  Procedures  : Pending I&D  DVT Prophylaxis  : Eliquis  Lab Results  Component Value Date   PLT 614 (H) 01/08/2018    Antibiotics  :    Anti-infectives (From admission, onward)   Start     Dose/Rate Route Frequency Ordered Stop   01/07/18 1400  vancomycin (VANCOCIN) 1,250 mg in sodium chloride 0.9 % 250 mL IVPB  Status:  Discontinued     1,250 mg 166.7 mL/hr over 90 Minutes Intravenous Every 24 hours 01/06/18 1900 01/06/18 1908   01/07/18 1000  vancomycin (VANCOCIN) IVPB 750 mg/150 ml premix     750 mg 150 mL/hr over 60 Minutes Intravenous Every 12 hours 01/06/18 1908     01/06/18 0600  ceFAZolin (ANCEF) IVPB 2g/100 mL premix  Status:  Discontinued     2 g 200 mL/hr over 30 Minutes Intravenous To ShortStay [facility] 01/05/18 1945 01/06/18 1716   01/03/18 1800  vancomycin (VANCOCIN) IVPB 750 mg/150 ml premix  Status:  Discontinued     750 mg 150 mL/hr over 60 Minutes Intravenous Every  24 hours 01/02/18 1712 01/03/18 1135   01/03/18 1800  vancomycin (VANCOCIN) IVPB 1000 mg/200 mL premix  Status:  Discontinued     1,000 mg 200 mL/hr over 60 Minutes Intravenous Every 24 hours 01/03/18 1135 01/06/18 1900   01/03/18 1800  ceFEPIme (MAXIPIME) 2 g in sodium chloride 0.9 % 100 mL IVPB     2 g 200 mL/hr over 30 Minutes Intravenous Every 12 hours 01/03/18 1142     01/03/18 0000  ceFEPIme (MAXIPIME) 1 g in sodium chloride 0.9 % 100 mL IVPB  Status:  Discontinued     1 g 200 mL/hr over 30 Minutes Intravenous Every 8 hours 01/02/18 1712 01/03/18 1142   01/02/18 1645  vancomycin (VANCOCIN) 1,500 mg in sodium chloride 0.9 % 500 mL IVPB     1,500 mg 250 mL/hr over 120 Minutes Intravenous STAT 01/02/18 1639 01/02/18 1941   01/02/18 1415  ceFEPIme (MAXIPIME) 2 g in sodium chloride 0.9 % 100 mL IVPB     2 g 200 mL/hr over 30 Minutes Intravenous  Once 01/02/18 1406 01/02/18 1619   01/02/18 1415  piperacillin-tazobactam (ZOSYN) IVPB 3.375 g     3.375 g 100 mL/hr over 30 Minutes Intravenous  Once 01/02/18 1406 01/02/18 1550        Objective:   Vitals:   01/07/18 2001 01/07/18 2007 01/07/18 2225 01/08/18 0647  BP:   135/66 125/63  Pulse:  64 61 (!) 56  Resp:  17 17 17   Temp:   98.5 F (36.9 C) 98.4 F (36.9 C)  TempSrc:   Oral Oral  SpO2: 93% 93% 97% 100%  Weight:      Height:        Wt Readings from Last 3 Encounters:  01/06/18 80.1 kg  12/31/17 79.4 kg  12/12/17 79.5 kg  Intake/Output Summary (Last 24 hours) at 01/08/2018 1321 Last data filed at 01/08/2018 1030 Gross per 24 hour  Intake 1005 ml  Output 800 ml  Net 205 ml     Physical Exam  Gen: NAD Chest: Bilateral air movement. Less crackles.  CVS: S 1, S 2 RRR GI: BS present, soft, nt Musculoskeletal: Left LE dressing     Data Review:    CBC Recent Labs  Lab 01/02/18 1413 01/03/18 0536 01/04/18 0331 01/05/18 0321 01/07/18 0452 01/08/18 0548  WBC 16.9* 15.0* 14.2* 14.5* 12.9* 12.1*  HGB 9.7*  9.0* 9.2* 8.4* 9.3* 9.1*  HCT 29.8* 28.1* 28.7* 25.9* 29.9* 29.5*  PLT 452* 428* 499* 544* 596* 614*  MCV 83.7 85.4 85.2 84.6 85.9 86.8  MCH 27.2 27.4 27.3 27.5 26.7 26.8  MCHC 32.6 32.0 32.1 32.4 31.1 30.8  RDW 14.5 14.6 14.6 15.0 14.9 14.6  LYMPHSABS 1.2  --   --  1.4  --   --   MONOABS 1.2*  --   --  1.1*  --   --   EOSABS 0.0  --   --  0.2  --   --   BASOSABS 0.0  --   --  0.0  --   --     Chemistries  Recent Labs  Lab 01/03/18 0536 01/04/18 0331 01/05/18 0321 01/07/18 0452 01/08/18 0548  NA 133* 138 137 131* 133*  K 3.7 4.1 3.6 4.3 4.6  CL 97* 99 99 93* 95*  CO2 26 28 30 30 29   GLUCOSE 231* 167* 122* 223* 196*  BUN 19 26* 22 23 23   CREATININE 0.99 1.01* 1.06* 1.25* 1.18*  CALCIUM 8.7* 9.1 9.1 9.3 9.2  MG  --   --  1.6*  --   --    ------------------------------------------------------------------------------------------------------------------ No results for input(s): CHOL, HDL, LDLCALC, TRIG, CHOLHDL, LDLDIRECT in the last 72 hours.  Lab Results  Component Value Date   HGBA1C 8.7 (A) 12/05/2017   ------------------------------------------------------------------------------------------------------------------ No results for input(s): TSH, T4TOTAL, T3FREE, THYROIDAB in the last 72 hours.  Invalid input(s): FREET3 ------------------------------------------------------------------------------------------------------------------ Recent Labs    01/06/18 1345  VITAMINB12 454  FERRITIN 124  TIBC 256  IRON 14*    Coagulation profile No results for input(s): INR, PROTIME in the last 168 hours.  No results for input(s): DDIMER in the last 72 hours.  Cardiac Enzymes Recent Labs  Lab 01/03/18 0942  TROPONINI 0.03*   ------------------------------------------------------------------------------------------------------------------    Component Value Date/Time   BNP 62.6 11/15/2015 0629    Inpatient Medications  Scheduled Meds: . amLODipine  5 mg Oral  Daily  . apixaban  5 mg Oral BID  . atorvastatin  20 mg Oral QPM  . buPROPion  150 mg Oral BID  . chlorhexidine  15 mL Mouth Rinse BID  . docusate sodium  100 mg Oral BID  . fenofibrate  160 mg Oral Daily  . FLUoxetine  20 mg Oral QHS  . furosemide  40 mg Oral BID  . gabapentin  600 mg Oral BID  . insulin glargine  20 Units Subcutaneous QHS  . insulin lispro  0-5 Units Subcutaneous QHS  . insulin lispro  0-9 Units Subcutaneous TID WC  . magnesium oxide  400 mg Oral Daily  . mouth rinse  15 mL Mouth Rinse q12n4p  . methimazole  10 mg Oral Daily  . metoprolol succinate  50 mg Oral BID  . pantoprazole  40 mg Oral Daily  . polyethylene glycol  17 g  Oral BID  . potassium chloride SA  20 mEq Oral BID  . sodium chloride flush  3 mL Intravenous Q12H  . sotalol  120 mg Oral Q12H   Continuous Infusions: . sodium chloride    . sodium chloride Stopped (01/07/18 0931)  . ceFEPime (MAXIPIME) IV 2 g (01/07/18 1830)  . lactated ringers Stopped (01/06/18 1540)  . lactated ringers 10 mL/hr at 01/07/18 0900  . methocarbamol (ROBAXIN) IV    . vancomycin 750 mg (01/08/18 1109)   PRN Meds:.sodium chloride, acetaminophen **OR** acetaminophen, albuterol, HYDROcodone-acetaminophen, HYDROcodone-acetaminophen, magnesium citrate, methocarbamol **OR** methocarbamol (ROBAXIN) IV, metoCLOPramide **OR** metoCLOPramide (REGLAN) injection, metoprolol tartrate, morphine injection, morphine injection, ondansetron **OR** ondansetron (ZOFRAN) IV, sodium chloride flush, traMADol  Micro Results Recent Results (from the past 240 hour(s))  WOUND CULTURE     Status: Abnormal   Collection Time: 12/31/17  4:40 PM  Result Value Ref Range Status   Gram Stain Result Final report  Final   Organism ID, Bacteria Comment  Final    Comment: No white blood cells seen.   Organism ID, Bacteria Comment  Final    Comment: Few gram positive cocci   Organism ID, Bacteria Comment  Final    Comment: Few gram negative diplococci.    Aerobic Bacterial Culture Final report (A)  Final   Organism ID, Bacteria Staphylococcus aureus (A)  Final    Comment: Heavy growth Based on susceptibility to oxacillin this isolate would be susceptible to: *Penicillinase-stable penicillins, such as:   Cloxacillin, Dicloxacillin, Nafcillin *Beta-lactam combination agents, such as:   Amoxicillin-clavulanic acid, Ampicillin-sulbactam,   Piperacillin-tazobactam *Oral cephems, such as:   Cefaclor, Cefdinir, Cefpodoxime, Cefprozil, Cefuroxime,   Cephalexin, Loracarbef *Parenteral cephems, such as:   Cefazolin, Cefepime, Cefotaxime, Cefotetan, Ceftaroline,   Ceftizoxime, Ceftriaxone, Cefuroxime *Carbapenems, such as:   Doripenem, Ertapenem, Imipenem, Meropenem    Organism ID, Bacteria Comment  Final    Comment: Multiple negative rods present, none predominant. This is indicative of a heavily colonized/contaminated specimen site. No further microbiological characterization will be done as it will not provide clinically relevant information. Scant growth    Organism ID, Bacteria Mixed skin flora  Final    Comment: Moderate growth   Antimicrobial Susceptibility Comment  Final    Comment:       ** S = Susceptible; I = Intermediate; R = Resistant **                    P = Positive; N = Negative             MICS are expressed in micrograms per mL    Antibiotic                 RSLT#1    RSLT#2    RSLT#3    RSLT#4 Ciprofloxacin                  S Clindamycin                    S Erythromycin                   S Gentamicin                     S Levofloxacin                   S Linezolid  S Moxifloxacin                   S Oxacillin                      S Penicillin                     R Quinupristin/Dalfopristin      S Rifampin                       S Tetracycline                   S Trimethoprim/Sulfa             S Vancomycin                     S   Culture, blood (routine x 2)     Status: None   Collection  Time: 01/02/18  2:13 PM  Result Value Ref Range Status   Specimen Description   Final    BLOOD RIGHT WRIST Performed at [facility] [address] [place], [place] [address]    Special Requests   Final    BOTTLES DRAWN AEROBIC AND ANAEROBIC Blood Culture adequate volume Performed at [facility] [address] [place], [place] [address]    Culture   Final    NO GROWTH 5 DAYS Performed at [facility], [place] [address], [place], [place] [address]    Report Status 01/07/2018 FINAL  Final  Culture, blood (routine x 2)     Status: None   Collection Time: 01/02/18  2:15 PM  Result Value Ref Range Status   Specimen Description   Final    BLOOD LEFT HAND Performed at [facility] [address] [place], [place] [address]    Special Requests   Final    BOTTLES DRAWN AEROBIC AND ANAEROBIC Blood Culture adequate volume Performed at [facility] [address] [place], [place] [address]    Culture   Final    NO GROWTH 5 DAYS Performed at [facility], [place] [address], [place], [place] [address]    Report Status 01/07/2018 FINAL  Final  MRSA PCR Screening     Status: None   Collection Time: 01/05/18  2:14 PM  Result Value Ref Range Status   MRSA by PCR NEGATIVE NEGATIVE Final    Comment:        The GeneXpert MRSA Assay (FDA approved for NASAL specimens only), is one component of a comprehensive MRSA colonization surveillance program. It is not intended to diagnose MRSA infection nor to guide or monitor treatment for MRSA infections. Performed at [facility], [place] [address] [place], [place] [address]   Aerobic/Anaerobic Culture (surgical/deep wound)     Status: None (Preliminary result)   Collection Time: 01/06/18  4:13 PM  Result Value Ref Range Status   Specimen Description TISSUE LEFT FOOT ULCER  Final   Special Requests   Final    NONE Performed at [facility], [place] [address], [place], [place] [address]    Gram Stain   Final    MODERATE WBC PRESENT, PREDOMINANTLY PMN RARE GRAM POSITIVE COCCI    Culture   Final    CULTURE REINCUBATED FOR BETTER GROWTH NO ANAEROBES ISOLATED; CULTURE IN PROGRESS FOR 5 DAYS  Report Status PENDING  Incomplete    Radiology Reports Dg Abd 1 View  Result Date: 01/07/2018 CLINICAL DATA:  67 year old female with an 8 day history of constipation EXAM: ABDOMEN - 1 VIEW COMPARISON:  CT scan of the abdomen and pelvis 08/07/2017 FINDINGS: The bowel gas pattern is not obstructed. The colonic stool burden is unremarkable. The stomach is mildly distended with gas. No acute osseous abnormality. Small amount of high density material is noted within a diverticulum along the medial margin of the descending colon. IMPRESSION: Negative. Electronically Signed   By: Jacqulynn Cadet M.D.   On: 01/07/2018 15:41   Mr Foot Left Wo Contrast  Result Date: 01/03/2018 CLINICAL DATA:  Left foot pain and swelling in a diabetic patient. Failure to respond to antibiotic treatment. EXAM: MRI OF THE LEFT FOOT WITHOUT CONTRAST TECHNIQUE: Multiplanar, multisequence MR imaging of the left foot was performed. No intravenous contrast was administered. COMPARISON:  Plain films left foot 01/03/2018. FINDINGS: Bones/Joint/Cartilage Bone marrow signal is normal without evidence of osteomyelitis, fracture or other focal lesion. Ligaments Intact. Muscles and Tendons Intermediate increased T2 signal and mild fatty atrophy of intrinsic musculature of the foot are consistent with diabetic myopathy. Soft tissues Subcutaneous edema is present over the dorsum of the foot with scattered locules of gas present, most notable over the third MTP joint. Subcutaneous edema about the toes is worst in the third toe. No notable joint effusion is identified. IMPRESSION: Extensive subcutaneous soft tissue edema in the dorsum of the foot with locules soft tissue gas as seen  on plain films consistent with cellulitis. Negative for osteomyelitis or septic joint. Subcutaneous edema about the toes is worst in the third toe but no abscess is identified on this noncontrast exam. Electronically Signed   By: Inge Rise M.D.   On: 01/03/2018 10:21   Dg Chest Port 1 View  Result Date: 01/05/2018 CLINICAL DATA:  Pneumonia EXAM: PORTABLE CHEST 1 VIEW COMPARISON:  01/04/2018 FINDINGS: Cardiac shadow is enlarged but stable. The lungs are well aerated bilaterally. No focal infiltrate or sizable effusion is seen. No bony abnormality is noted. IMPRESSION: No acute abnormality seen. Electronically Signed   By: Inez Catalina M.D.   On: 01/05/2018 10:47   Dg Chest Port 1 View  Result Date: 01/04/2018 CLINICAL DATA:  Shortness of Breath EXAM: PORTABLE CHEST 1 VIEW COMPARISON:  01/03/2018 FINDINGS: Cardiac shadow is within normal limits. The lungs are well aerated bilaterally. The degree of vascular congestion has improved in the interval. No sizable effusion or infiltrate is seen. Old rib fractures are noted on the right. IMPRESSION: No acute abnormality noted. Electronically Signed   By: Inez Catalina M.D.   On: 01/04/2018 08:20   Dg Chest Port 1 View  Result Date: 01/03/2018 CLINICAL DATA:  Acute respiratory failure, hypoxia EXAM: PORTABLE CHEST 1 VIEW COMPARISON:  01/03/2018 FINDINGS: Cardiomegaly with vascular congestion. Mild interstitial prominence throughout the lungs could reflect interstitial edema. No confluent opacities or effusions. IMPRESSION: Cardiomegaly with vascular congestion and interstitial prominence, likely interstitial edema. Electronically Signed   By: Rolm Baptise M.D.   On: 01/03/2018 19:34   Dg Chest Port 1 View  Result Date: 01/03/2018 CLINICAL DATA:  Chest pain EXAM: PORTABLE CHEST 1 VIEW COMPARISON:  11/04/2017 FINDINGS: Interstitial coarsening with Kerley lines. Chronic cardiomegaly. No air bronchogram, effusion, or pneumothorax. IMPRESSION: CHF pattern.  Electronically Signed   By: Monte Fantasia M.D.   On: 01/03/2018 09:49   Dg Foot Complete Left  Result Date: 01/02/2018  CLINICAL DATA:  Cellulitis. On antibiotics for 2 days, worsening symptoms. EXAM: LEFT FOOT - COMPLETE 3+ VIEW COMPARISON:  None. FINDINGS: No acute fracture deformity or dislocation. No destructive bony lesions. Moderate plantar calcaneal spur. Calcification at Achilles insertion seen with old injury. Dorsal foot soft tissue swelling with forefoot dorsal subcutaneous gas. IMPRESSION: 1. Soft tissue swelling with dorsal forefoot subcutaneous gas concerning for necrotizing fasciitis. 2. No acute osseous process. Acute findings discussed with and reconfirmed by PA.MICHAEL MACZIS on 01/02/2018 at 1:49 pm. Electronically Signed   By: Elon Alas M.D.   On: 01/02/2018 13:49   Dg Foot Complete Right  Result Date: 12/31/2017 CLINICAL DATA:  foot sweling, eval for subq air, bone destruction, FB. Diabetic EXAM: RIGHT FOOT COMPLETE - 3+ VIEW COMPARISON:  None. FINDINGS: Osseous alignment is normal. Bone mineralization is normal. No fracture line or displaced fracture fragment. No acute or suspicious osseous lesion. No destructive change to suggest osteomyelitis. No significant degenerative change. No soft tissue gas. No foreign body appreciated within the soft tissues. Incidental note made of chronic osseous spurring at the plantar margin of the posterior calcaneus and linear calcifications at the Achilles tendon insertion site suggesting chronic calcific tendinopathy. IMPRESSION: No osseous abnormality.  No soft tissue gas.  No foreign body seen. Electronically Signed   By: Franki Cabot M.D.   On: 12/31/2017 16:38    Time Spent in minutes  35   Samariah Hokenson A Alma Mohiuddin M.D on 01/08/2018 at 1:21 PM [phone]  Between 7am to 7pm - Pager - [phone]  After 7pm go to [email or web address] - password [facility]  [facility] -  Office  [phone]

## 2018-01-09 LAB — BASIC METABOLIC PANEL
Anion gap: 8 (ref 5–15)
BUN: 21 mg/dL (ref 8–23)
CALCIUM: 9.3 mg/dL (ref 8.9–10.3)
CO2: 31 mmol/L (ref 22–32)
Chloride: 97 mmol/L — ABNORMAL LOW (ref 98–111)
Creatinine, Ser: 1.25 mg/dL — ABNORMAL HIGH (ref 0.44–1.00)
GFR calc Af Amer: 51 mL/min — ABNORMAL LOW (ref >60)
GFR calc non Af Amer: 44 mL/min — ABNORMAL LOW (ref >60)
GLUCOSE: 138 mg/dL — AB (ref 70–99)
Potassium: 4.9 mmol/L (ref 3.5–5.1)
Sodium: 136 mmol/L (ref 135–145)

## 2018-01-09 LAB — CREATININE, SERUM
CREATININE: 1.27 mg/dL — AB (ref 0.44–1.00)
GFR calc non Af Amer: 43 mL/min — ABNORMAL LOW (ref >60)
GFR, EST AFRICAN AMERICAN: 50 mL/min — AB (ref >60)

## 2018-01-09 LAB — GLUCOSE, CAPILLARY
GLUCOSE-CAPILLARY: 225 mg/dL — AB (ref 70–99)
Glucose-Capillary: 133 mg/dL — ABNORMAL HIGH (ref 70–99)
Glucose-Capillary: 187 mg/dL — ABNORMAL HIGH (ref 70–99)
Glucose-Capillary: 279 mg/dL — ABNORMAL HIGH (ref 70–99)

## 2018-01-09 LAB — CBC
HEMATOCRIT: 29.1 % — AB (ref 36.0–46.0)
Hemoglobin: 8.8 g/dL — ABNORMAL LOW (ref 12.0–15.0)
MCH: 26.6 pg (ref 26.0–34.0)
MCHC: 30.2 g/dL (ref 30.0–36.0)
MCV: 87.9 fL (ref 78.0–100.0)
Platelets: 620 10*3/uL — ABNORMAL HIGH (ref 150–400)
RBC: 3.31 MIL/uL — ABNORMAL LOW (ref 3.87–5.11)
RDW: 14.9 % (ref 11.5–15.5)
WBC: 10 10*3/uL (ref 4.0–10.5)

## 2018-01-09 MED ORDER — FERROUS SULFATE 325 (65 FE) MG PO TABS
325.0000 mg | ORAL_TABLET | Freq: Two times a day (BID) | ORAL | Status: DC
  Administered 2018-01-09 – 2018-01-11 (×4): 325 mg via ORAL
  Filled 2018-01-09 (×4): qty 1

## 2018-01-09 MED ORDER — SODIUM CHLORIDE 0.9 % IV SOLN
2.0000 g | INTRAVENOUS | Status: DC
  Filled 2018-01-09: qty 2

## 2018-01-09 NOTE — Progress Notes (Signed)
Subjective: Reports no pain in the left foot.  POD# 3 I&D left foot abscess. Cx growing Strep sps.  Patient reports she is feeling much better.    Objective: Vital signs in last 24 hours: Temp:  [98.1 F (36.7 C)-98.6 F (37 C)] 98.1 F (36.7 C) (09/02 0525) Pulse Rate:  [50-96] 59 (09/02 0525) Resp:  [14-18] 18 (09/02 0525) BP: (105-124)/(53-84) 124/84 (09/02 0525) SpO2:  [92 %-100 %] 92 % (09/02 0525)  Intake/Output from previous day: 09/01 0701 - 09/02 0700 In: 988.1 [P.O.:240; I.V.:98.1; IV Piggyback:650] Out: 5284 [Urine:1550] Intake/Output this shift: No intake/output data recorded.  Recent Labs    01/07/18 0452 01/08/18 0548 01/09/18 0452  HGB 9.3* 9.1* 8.8*   Recent Labs    01/08/18 0548 01/09/18 0452  WBC 12.1* 10.0  RBC 3.40* 3.31*  HCT 29.5* 29.1*  PLT 614* 620*   Recent Labs    01/08/18 0548 01/09/18 0452  NA 133* 136  K 4.6 4.9  CL 95* 97*  CO2 29 31  BUN 23 21  CREATININE 1.18* 1.25*  1.27*  GLUCOSE 196* 138*  CALCIUM 9.2 9.3   No results for input(s): LABPT, INR in the last 72 hours.  Left foot dressing removed. Minimal bloody drainage on dressing and penrose drain removed.  Dorsal incision loosely closed , plantar incision partially closed and draining serosanguinous drainage.  Patient sitting up in chair. Alert and appropriate. Still on O2 per [place].      Assessment/Plan: POD # 3 I&D left foot abscess- Daily dressing changes.  Continue PT Await culture results.   [place]

## 2018-01-09 NOTE — Care Management Important Message (Signed)
Important Message  Patient Details  Name: Judith [name] MRN: [id number] Date of Birth: February 11, 1951   Medicare Important Message Given:  Yes Patient Signed 01/06/2018  Orbie Pyo 01/09/2018, 10:08 AM

## 2018-01-09 NOTE — Progress Notes (Signed)
PROGRESS NOTE                                                                                                                                                                                                             Patient Demographics:    Judith [name], is a 67 y.o. female, DOB - 08-Jul-1950, QMV:[id number]  Admit date - 01/02/2018     LOS - 7    Chief Complaint  Patient presents with  . 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 fib on Eliquis status post multiple DCCV in the past, hypertension, hyperlipidemia, CAD with history of PCI to DES, GERD, history of goiter, right foot amputation, OSA on BiPAP who presented to [facility] hospital with fever and redness with increased swelling of the left foot that started draining clear liquid. Patient was placed on empiric IV antibiotics and orthopedics consulted.  Hospital stay complicated with development of acute respiratory failure with hypoxia secondary to pulmonary edema requiring high flow nasal cannula and BiPAP.  Next line patient transferred to [facility] for I&D of the left foot abscess.   Subjective:   She report mild dyspnea on exertion, but overall breathing better    Assessment  & Plan :    Principal Problem: Sepsis secondary to left leg cellulitis with abscess Vitals stable.  Continue empiric vancomycin and cefepime.  Pain control and PRN Tylenol. S/P I and D.  Follow culture growing streptococcus mitis. Awaiting sensitivity   Acute respiratory failure with hypoxia (HCC) Acute diastolic CHF ([place]) Associated with pulmonary edema requiring BiPAP and high flow nasal cannula.   now on 3 L via nasal cannula.   2D echo shows EF of 55-60% with LVH with grade 2 diastolic  dysfunction. Improved on IV lasix,.  Change lasix to oral. 40 mg BID>  Negative 3.7  L.   Constipation; resolved No BM in several days.  Bowel regimen.  KUB negative.  She had several Bowel movement.    Type II diabetes mellitus, uncontrolled with nephropathy and polyneuropathy  (HCC) A1c of 8.7.  Continue Lantus at bedtime.  Currently on sliding scale coverage with stable CBG.  Chronic kidney disease stage III  Renal function stable. Follow labs in am.   Coronary artery disease Asymptomatic.  Continue statin.  Permanent A. fib Rate controlled on the monitor.  Continue sotalol and Eliquis.  HR elevated yesterday, received dose of metoprolol   Essential hypertension Continue amlodipine.  Anemia  baseline around 12. Iron low, B 12 454 Started  iron supplement.   Hypomagnesemia Replenished  Code Status : Full code  Family Communication  : Sister at bedside  Disposition Plan  : SNF hopefully tomorrow when culture available.   Barriers For Discharge : Active symptoms  Consults  : Dr. Sharol Given  Procedures  : Pending I&D  DVT Prophylaxis  : Eliquis  Lab Results  Component Value Date   PLT 620 (H) 01/09/2018    Antibiotics  :   Vancomycin, cefepime       Objective:   Vitals:   01/08/18 1331 01/08/18 2133 01/08/18 2304 01/09/18 0525  BP: (!) 105/53 122/62  124/84  Pulse: (!) 50 (!) 58 96 (!) 59  Resp: 14 18 16 18   Temp: 98.2 F (36.8 C) 98.6 F (37 C)  98.1 F (36.7 C)  TempSrc: Oral Oral    SpO2: 98% 100% 97% 92%  Weight:      Height:        Wt Readings from Last 3 Encounters:  01/06/18 80.1 kg  12/31/17 79.4 kg  12/12/17 79.5 kg     Intake/Output Summary (Last 24 hours) at 01/09/2018 1315 Last data filed at 01/09/2018 1216 Gross per 24 hour  Intake 988.05 ml  Output 1700 ml  Net -711.95 ml     Physical Exam  Gen: NAD Chest: CTA CVS: S 1, S 2 RRR GI: BS present, soft, nt Musculoskeletal: Left LE with dressing.      Data Review:     CBC Recent Labs  Lab 01/02/18 1413  01/04/18 0331 01/05/18 0321 01/07/18 0452 01/08/18 0548 01/09/18 0452  WBC 16.9*   < > 14.2* 14.5* 12.9* 12.1* 10.0  HGB 9.7*   < > 9.2* 8.4* 9.3* 9.1* 8.8*  HCT 29.8*   < > 28.7* 25.9* 29.9* 29.5* 29.1*  PLT 452*   < > 499* 544* 596* 614* 620*  MCV 83.7   < > 85.2 84.6 85.9 86.8 87.9  MCH 27.2   < > 27.3 27.5 26.7 26.8 26.6  MCHC 32.6   < > 32.1 32.4 31.1 30.8 30.2  RDW 14.5   < > 14.6 15.0 14.9 14.6 14.9  LYMPHSABS 1.2  --   --  1.4  --   --   --   MONOABS 1.2*  --   --  1.1*  --   --   --   EOSABS 0.0  --   --  0.2  --   --   --   BASOSABS 0.0  --   --  0.0  --   --   --    < > = values in this interval not displayed.    Chemistries  Recent Labs  Lab 01/04/18 0331 01/05/18 0321 01/07/18 0452 01/08/18 0548 01/09/18 0452  NA 138 137 131* 133* 136  K 4.1 3.6 4.3 4.6 4.9  CL 99 99 93* 95* 97*  CO2 28 30 30 29 31   GLUCOSE 167* 122* 223* 196* 138*  BUN 26* 22 23 23 21   CREATININE 1.01* 1.06* 1.25* 1.18* 1.25*  1.27*  CALCIUM 9.1 9.1 9.3 9.2 9.3  MG  --  1.6*  --   --   --    ------------------------------------------------------------------------------------------------------------------  No results for input(s): CHOL, HDL, LDLCALC, TRIG, CHOLHDL, LDLDIRECT in the last 72 hours.  Lab Results  Component Value Date   HGBA1C 8.7 (A) 12/05/2017   ------------------------------------------------------------------------------------------------------------------ No results for input(s): TSH, T4TOTAL, T3FREE, THYROIDAB in the last 72 hours.  Invalid input(s): FREET3 ------------------------------------------------------------------------------------------------------------------ Recent Labs    01/06/18 1345  VITAMINB12 454  FERRITIN 124  TIBC 256  IRON 14*    Coagulation profile No results for input(s): INR, PROTIME in the last 168 hours.  No results for input(s): DDIMER in the last 72 hours.  Cardiac Enzymes Recent  Labs  Lab 01/03/18 0942  TROPONINI 0.03*   ------------------------------------------------------------------------------------------------------------------    Component Value Date/Time   BNP 62.6 11/15/2015 0629    Inpatient Medications  Scheduled Meds: . amLODipine  5 mg Oral Daily  . apixaban  5 mg Oral BID  . atorvastatin  20 mg Oral QPM  . buPROPion  150 mg Oral BID  . chlorhexidine  15 mL Mouth Rinse BID  . docusate sodium  100 mg Oral BID  . fenofibrate  160 mg Oral Daily  . ferrous sulfate  325 mg Oral BID WC  . FLUoxetine  20 mg Oral QHS  . furosemide  40 mg Oral BID  . gabapentin  600 mg Oral BID  . insulin glargine  20 Units Subcutaneous QHS  . insulin lispro  0-5 Units Subcutaneous QHS  . insulin lispro  0-9 Units Subcutaneous TID WC  . magnesium oxide  400 mg Oral Daily  . mouth rinse  15 mL Mouth Rinse q12n4p  . methimazole  10 mg Oral Daily  . metoprolol succinate  50 mg Oral BID  . pantoprazole  40 mg Oral Daily  . polyethylene glycol  17 g Oral BID  . potassium chloride SA  20 mEq Oral BID  . sodium chloride flush  3 mL Intravenous Q12H  . sotalol  120 mg Oral Q12H   Continuous Infusions: . sodium chloride    . sodium chloride 10 mL/hr at 01/08/18 1727  . ceFEPime (MAXIPIME) IV 2 g (01/09/18 1051)  . lactated ringers Stopped (01/06/18 1540)  . lactated ringers 10 mL/hr at 01/07/18 0900  . methocarbamol (ROBAXIN) IV    . vancomycin 750 mg (01/09/18 0921)   PRN Meds:.sodium chloride, acetaminophen **OR** acetaminophen, albuterol, HYDROcodone-acetaminophen, HYDROcodone-acetaminophen, magnesium citrate, methocarbamol **OR** methocarbamol (ROBAXIN) IV, metoCLOPramide **OR** metoCLOPramide (REGLAN) injection, metoprolol tartrate, morphine injection, morphine injection, ondansetron **OR** ondansetron (ZOFRAN) IV, sodium chloride flush, traMADol  Micro Results Recent Results (from the past 240 hour(s))  WOUND CULTURE     Status: Abnormal   Collection  Time: 12/31/17  4:40 PM  Result Value Ref Range Status   Gram Stain Result Final report  Final   Organism ID, Bacteria Comment  Final    Comment: No white blood cells seen.   Organism ID, Bacteria Comment  Final    Comment: Few gram positive cocci   Organism ID, Bacteria Comment  Final    Comment: Few gram negative diplococci.   Aerobic Bacterial Culture Final report (A)  Final   Organism ID, Bacteria Staphylococcus aureus (A)  Final    Comment: Heavy growth Based on susceptibility to oxacillin this isolate would be susceptible to: *Penicillinase-stable penicillins, such as:   Cloxacillin, Dicloxacillin, Nafcillin *Beta-lactam combination agents, such as:   Amoxicillin-clavulanic acid, Ampicillin-sulbactam,   Piperacillin-tazobactam *Oral cephems, such as:   Cefaclor, Cefdinir, Cefpodoxime, Cefprozil, Cefuroxime,   Cephalexin, Loracarbef *Parenteral cephems, such as:   Cefazolin, Cefepime,  Cefotaxime, Cefotetan, Ceftaroline,   Ceftizoxime, Ceftriaxone, Cefuroxime *Carbapenems, such as:   Doripenem, Ertapenem, Imipenem, Meropenem    Organism ID, Bacteria Comment  Final    Comment: Multiple negative rods present, none predominant. This is indicative of a heavily colonized/contaminated specimen site. No further microbiological characterization will be done as it will not provide clinically relevant information. Scant growth    Organism ID, Bacteria Mixed skin flora  Final    Comment: Moderate growth   Antimicrobial Susceptibility Comment  Final    Comment:       ** S = Susceptible; I = Intermediate; R = Resistant **                    P = Positive; N = Negative             MICS are expressed in micrograms per mL    Antibiotic                 RSLT#1    RSLT#2    RSLT#3    RSLT#4 Ciprofloxacin                  S Clindamycin                    S Erythromycin                   S Gentamicin                     S Levofloxacin                   S Linezolid                       S Moxifloxacin                   S Oxacillin                      S Penicillin                     R Quinupristin/Dalfopristin      S Rifampin                       S Tetracycline                   S Trimethoprim/Sulfa             S Vancomycin                     S   Culture, blood (routine x 2)     Status: None   Collection Time: 01/02/18  2:13 PM  Result Value Ref Range Status   Specimen Description   Final    BLOOD RIGHT WRIST Performed at [facility] [address] [place], [place] [address]    Special Requests   Final    BOTTLES DRAWN AEROBIC AND ANAEROBIC Blood Culture adequate volume Performed at [facility] [address], [place], [place] [address]    Culture   Final    NO GROWTH 5 DAYS Performed at [facility], [place] [address], [place], [place] [address]    Report Status 01/07/2018 FINAL  Final  Culture, blood (routine x 2)     Status: None   Collection Time:  01/02/18  2:15 PM  Result Value Ref Range Status   Specimen Description   Final    BLOOD LEFT HAND Performed at [facility] [address], [place], [place] [address]    Special Requests   Final    BOTTLES DRAWN AEROBIC AND ANAEROBIC Blood Culture adequate volume Performed at [facility] [address], [place], [place] [address]    Culture   Final    NO GROWTH 5 DAYS Performed at [facility], [place] [address], [place], [place] [address]    Report Status 01/07/2018 FINAL  Final  MRSA PCR Screening     Status: None   Collection Time: 01/05/18  2:14 PM  Result Value Ref Range Status   MRSA by PCR NEGATIVE NEGATIVE Final    Comment:        The GeneXpert MRSA Assay (FDA approved for NASAL specimens only), is one component of a comprehensive MRSA colonization surveillance program. It is not intended to diagnose MRSA infection nor to guide or monitor treatment for MRSA infections. Performed at [facility], [place] [address], [place], [place] [address]   Aerobic/Anaerobic Culture (surgical/deep wound)     Status: None (Preliminary result)   Collection Time: 01/06/18  4:13 PM  Result Value Ref Range Status   Specimen Description TISSUE LEFT FOOT ULCER  Final   Special Requests   Final    NONE Performed at [facility], [place] [address], [place], [place] [address]    Gram Stain   Final    MODERATE WBC PRESENT, PREDOMINANTLY PMN RARE GRAM POSITIVE COCCI    Culture   Final    RARE STREPTOCOCCUS MITIS/ORALIS NO ANAEROBES ISOLATED; CULTURE IN PROGRESS FOR 5 DAYS    Report Status PENDING  Incomplete    Radiology Reports Dg Abd 1 View  Result Date: 01/07/2018 CLINICAL DATA:  67 year old female with an 8 day history of constipation EXAM: ABDOMEN - 1 VIEW COMPARISON:  CT scan of the abdomen and pelvis 08/07/2017 FINDINGS: The bowel gas pattern is not obstructed. The colonic stool burden is unremarkable. The stomach is mildly distended with gas. No acute osseous abnormality. Small amount of high density material is noted within a diverticulum along the medial margin of the descending colon. IMPRESSION: Negative. Electronically Signed   By: Jacqulynn Cadet M.D.   On: 01/07/2018 15:41   Mr Foot Left Wo Contrast  Result Date: 01/03/2018 CLINICAL DATA:  Left foot pain and swelling in a diabetic patient. Failure to respond to antibiotic treatment. EXAM: MRI OF THE LEFT FOOT WITHOUT CONTRAST TECHNIQUE: Multiplanar, multisequence MR imaging of the left foot was performed. No intravenous contrast was administered. COMPARISON:  Plain films left foot 01/03/2018. FINDINGS: Bones/Joint/Cartilage Bone marrow signal is normal without evidence of osteomyelitis, fracture or other focal lesion. Ligaments Intact. Muscles and Tendons Intermediate increased T2 signal and mild fatty atrophy of intrinsic musculature of the foot are consistent with diabetic myopathy. Soft tissues Subcutaneous edema is  present over the dorsum of the foot with scattered locules of gas present, most notable over the third MTP joint. Subcutaneous edema about the toes is worst in the third toe. No notable joint effusion is identified. IMPRESSION: Extensive subcutaneous soft tissue edema in the dorsum of the foot with locules soft tissue gas as seen on plain films consistent with cellulitis. Negative for osteomyelitis or septic joint. Subcutaneous edema about the toes is worst in the third toe but no abscess is  identified on this noncontrast exam. Electronically Signed   By: Inge Rise M.D.   On: 01/03/2018 10:21   Dg Chest Port 1 View  Result Date: 01/05/2018 CLINICAL DATA:  Pneumonia EXAM: PORTABLE CHEST 1 VIEW COMPARISON:  01/04/2018 FINDINGS: Cardiac shadow is enlarged but stable. The lungs are well aerated bilaterally. No focal infiltrate or sizable effusion is seen. No bony abnormality is noted. IMPRESSION: No acute abnormality seen. Electronically Signed   By: Inez Catalina M.D.   On: 01/05/2018 10:47   Dg Chest Port 1 View  Result Date: 01/04/2018 CLINICAL DATA:  Shortness of Breath EXAM: PORTABLE CHEST 1 VIEW COMPARISON:  01/03/2018 FINDINGS: Cardiac shadow is within normal limits. The lungs are well aerated bilaterally. The degree of vascular congestion has improved in the interval. No sizable effusion or infiltrate is seen. Old rib fractures are noted on the right. IMPRESSION: No acute abnormality noted. Electronically Signed   By: Inez Catalina M.D.   On: 01/04/2018 08:20   Dg Chest Port 1 View  Result Date: 01/03/2018 CLINICAL DATA:  Acute respiratory failure, hypoxia EXAM: PORTABLE CHEST 1 VIEW COMPARISON:  01/03/2018 FINDINGS: Cardiomegaly with vascular congestion. Mild interstitial prominence throughout the lungs could reflect interstitial edema. No confluent opacities or effusions. IMPRESSION: Cardiomegaly with vascular congestion and interstitial prominence, likely interstitial edema. Electronically  Signed   By: Rolm Baptise M.D.   On: 01/03/2018 19:34   Dg Chest Port 1 View  Result Date: 01/03/2018 CLINICAL DATA:  Chest pain EXAM: PORTABLE CHEST 1 VIEW COMPARISON:  11/04/2017 FINDINGS: Interstitial coarsening with Kerley lines. Chronic cardiomegaly. No air bronchogram, effusion, or pneumothorax. IMPRESSION: CHF pattern. Electronically Signed   By: Monte Fantasia M.D.   On: 01/03/2018 09:49   Dg Foot Complete Left  Result Date: 01/02/2018 CLINICAL DATA:  Cellulitis. On antibiotics for 2 days, worsening symptoms. EXAM: LEFT FOOT - COMPLETE 3+ VIEW COMPARISON:  None. FINDINGS: No acute fracture deformity or dislocation. No destructive bony lesions. Moderate plantar calcaneal spur. Calcification at Achilles insertion seen with old injury. Dorsal foot soft tissue swelling with forefoot dorsal subcutaneous gas. IMPRESSION: 1. Soft tissue swelling with dorsal forefoot subcutaneous gas concerning for necrotizing fasciitis. 2. No acute osseous process. Acute findings discussed with and reconfirmed by PA.MICHAEL MACZIS on 01/02/2018 at 1:49 pm. Electronically Signed   By: Elon Alas M.D.   On: 01/02/2018 13:49   Dg Foot Complete Right  Result Date: 12/31/2017 CLINICAL DATA:  foot sweling, eval for subq air, bone destruction, FB. Diabetic EXAM: RIGHT FOOT COMPLETE - 3+ VIEW COMPARISON:  None. FINDINGS: Osseous alignment is normal. Bone mineralization is normal. No fracture line or displaced fracture fragment. No acute or suspicious osseous lesion. No destructive change to suggest osteomyelitis. No significant degenerative change. No soft tissue gas. No foreign body appreciated within the soft tissues. Incidental note made of chronic osseous spurring at the plantar margin of the posterior calcaneus and linear calcifications at the Achilles tendon insertion site suggesting chronic calcific tendinopathy. IMPRESSION: No osseous abnormality.  No soft tissue gas.  No foreign body seen. Electronically Signed    By: Franki Cabot M.D.   On: 12/31/2017 16:38    Time Spent in minutes  35   Thorsten Climer A Syvanna Ciolino M.D on 01/09/2018 at 1:15 PM [phone]  Between 7am to 7pm - Pager - [phone]  After 7pm go to [email or web address] - password [facility]

## 2018-01-09 NOTE — Progress Notes (Signed)
Pharmacy Antibiotic Note  Judith [name] is a 67 y.o. female admitted on 01/02/2018 with cellulitis.  Pharmacy has been consulted for vancomycin and cefepime dosing. Pt is afebrile and WBC is WNL. SCr is up a bit today. MRI is negative for osteo.   Plan: Continue vanc 750mg  IV Q12H Change cefepime to 2gm IV Q24H F/u renal fxn, C&S, clinical status and trough at SS  Height: 5\' 4"  (162.6 cm) Weight: 176 lb 9.4 oz (80.1 kg) IBW/kg (Calculated) : 54.7  Temp (24hrs), Avg:98.3 F (36.8 C), Min:98.1 F (36.7 C), Max:98.6 F (37 C)  Recent Labs  Lab 01/02/18 1422  01/04/18 0331 01/05/18 0321 01/06/18 1701 01/07/18 0452 01/08/18 0548 01/09/18 0452  WBC  --    < > 14.2* 14.5*  --  12.9* 12.1* 10.0  CREATININE  --    < > 1.01* 1.06*  --  1.25* 1.18* 1.25*  1.27*  LATICACIDVEN 1.99*  --   --   --   --   --   --   --   VANCOTROUGH  --   --   --   --  9*  --   --   --    < > = values in this interval not displayed.    Estimated Creatinine Clearance: 45.2 mL/min (A) (by C-G formula based on SCr of 1.27 mg/dL (H)).    Allergies  Allergen Reactions  . Contrast Media [Iodinated Diagnostic Agents] Hives    Spoke to patient, Iodine allergy is really IV contrast allergy.   . Dilaudid [Hydromorphone Hcl] Other (See Comments)    HEADACHE  . Novolog [Insulin Aspart] Shortness Of Breath    "breathing problems"  . Codeine Nausea And Vomiting    HIGH DOSES-SEVERE VOMITING  . Iodine Other (See Comments)    MUST HAVE BENADRYL PRIOR TO PROCEDURE AND RIGHT BEFORE TREATMENT TO COUNTERACT REACTION-BLISTERING REACTION DERMATOLOGICAL  . Penicillins Itching, Rash and Other (See Comments)    Has patient had a PCN reaction causing immediate rash, facial/tongue/throat swelling, SOB or lightheadedness with hypotension: no Has patient had a PCN reaction causing severe rash involving mucus membranes or skin necrosis: No Has patient had a PCN reaction that required hospitalization No Has patient had a PCN  reaction occurring within the last 10 years: No If all of the above answers are "NO", then may proceed with Cephalosporin use.  CHEST SIZED RASH AND ITCHING   . Propofol Other (See Comments)    "Breathing problems - asthma attack" Can take with benadryl   . Ace Inhibitors Cough  . Demerol [Meperidine] Nausea And Vomiting  . Neosporin [Neomycin-Bacitracin Zn-Polymyx] Itching and Rash    MAKES REACTIONS WORSE WHEN USING AS PROPHYLACTIC  . Percocet [Oxycodone-Acetaminophen] Rash  . Tape Itching and Rash    Antimicrobials this admission: PTA Doxycycline, Cephalexin  8/26 Zosyn x 1 dose  8/26 Cefepime>> 8/26 Vanc >>  Microbiology results: 8/26 BCx: NEG 8/27 HIV antibody: negative  8/29 MRSA pcr: negative  8/26 Blood negative 8/30 deep tissue foot ulcer >> rare strep mitis/oralis  Salome Arnt, PharmD, BCPS Please see AMION for all pharmacy numbers 01/09/2018 2:16 PM

## 2018-01-10 ENCOUNTER — Encounter (HOSPITAL_COMMUNITY): Payer: Self-pay | Admitting: Orthopedic Surgery

## 2018-01-10 LAB — GLUCOSE, CAPILLARY
GLUCOSE-CAPILLARY: 224 mg/dL — AB (ref 70–99)
GLUCOSE-CAPILLARY: 178 mg/dL — AB (ref 70–99)
GLUCOSE-CAPILLARY: 225 mg/dL — AB (ref 70–99)
Glucose-Capillary: 99 mg/dL (ref 70–99)
Glucose-Capillary: 229 mg/dL — ABNORMAL HIGH (ref 70–99)

## 2018-01-10 MED ORDER — FLUCONAZOLE 100 MG PO TABS: 100.0000 mg | ORAL_TABLET | Freq: Every day | ORAL | 0 refills | Status: DC | PRN

## 2018-01-10 MED ORDER — HYDROCODONE-ACETAMINOPHEN 5-325 MG PO TABS: 1.0000 | ORAL_TABLET | ORAL | 0 refills | Status: DC | PRN

## 2018-01-10 MED ORDER — DOXYCYCLINE HYCLATE 100 MG PO TABS: 100.0000 mg | ORAL_TABLET | Freq: Two times a day (BID) | ORAL | 0 refills | Status: DC

## 2018-01-10 MED ORDER — FUROSEMIDE 40 MG PO TABS: 40.0000 mg | ORAL_TABLET | Freq: Two times a day (BID) | ORAL | 0 refills | Status: DC

## 2018-01-10 MED ORDER — FLUCONAZOLE 100 MG PO TABS: 100.0000 mg | ORAL_TABLET | Freq: Every day | ORAL | Status: DC | PRN

## 2018-01-10 MED ORDER — DOXYCYCLINE HYCLATE 100 MG PO TABS
100.0000 mg | ORAL_TABLET | Freq: Two times a day (BID) | ORAL | Status: DC
  Administered 2018-01-10 – 2018-01-11 (×3): 100 mg via ORAL
  Filled 2018-01-10 (×3): qty 1

## 2018-01-10 MED ORDER — AMLODIPINE BESYLATE 5 MG PO TABS: 5.0000 mg | ORAL_TABLET | Freq: Every day | ORAL | 0 refills | Status: DC

## 2018-01-10 MED ORDER — FERROUS SULFATE 325 (65 FE) MG PO TABS: 325.0000 mg | ORAL_TABLET | Freq: Two times a day (BID) | ORAL | 3 refills | Status: DC

## 2018-01-10 MED ORDER — POLYETHYLENE GLYCOL 3350 17 G PO PACK: 17.0000 g | PACK | Freq: Every day | ORAL | 0 refills | Status: DC | PRN

## 2018-01-10 NOTE — Social Work (Signed)
Pt has chosen  1. [facility] 2. [facility].   Continuing to await authorization.  [place], [place] Work [phone]

## 2018-01-10 NOTE — Clinical Social Work Note (Signed)
Clinical Social Work Assessment  Patient Details  Name: Judith [name] MRN: [id number] Date of Birth: 27-Nov-1950  Date of referral:  01/10/18               Reason for consult:  Facility Placement, Discharge Planning                Permission sought to share information with:  Facility Art therapist granted to share information::  Yes, Verbal Permission Granted  Name::        Agency::     Relationship::  SNFs  Contact Information:     Housing/Transportation Living arrangements for the past 2 months:  Single Family Home Source of Information:  Patient Patient Interpreter Needed:  None Criminal Activity/Legal Involvement Pertinent to Current Situation/Hospitalization:  No - Comment as needed Significant Relationships:  Siblings, Adult Children Lives with:  Siblings Do you feel safe going back to the place where you live?  Yes Need for family participation in patient care:  Yes (Comment)  Care giving concerns: Patient from home with sister. PT recommending SNF.   Social Worker assessment / plan: CSW met with patient at bedside. Patient alert and oriented. CSW introduced self and role and discussed disposition planning - PT recommendation for SNF. Patient agreeable to SNF. CSW sent out initial referrals and started [facility] authorization request; determination pending. [employer] auth required before patient can admit to facility. CSW will provide SNF bed offers when available. CSW will follow and support with discharge planning.  Employment status:  Retired Forensic scientist:  Managed Medicare([employer]) PT Recommendations:  [place] / Referral to community resources:  [place]  Patient/Family's Response to care: Patient appreciative of care.  Patient/Family's Understanding of and Emotional Response to Diagnosis, Current Treatment, and Prognosis: Patient with understanding of her conditions and agreeable to SNF.  Emotional  Assessment Appearance:  Appears stated age Attitude/Demeanor/Rapport:  Engaged Affect (typically observed):  Accepting, Calm, Appropriate, Pleasant Orientation:  Oriented to Self, Oriented to Place, Oriented to  Time, Oriented to Situation Alcohol / Substance use:  Not Applicable Psych involvement (Current and /or in the community):  No (Comment)  Discharge Needs  Concerns to be addressed:  Discharge Planning Concerns, Care Coordination Readmission within the last 30 days:  No Current discharge risk:  Physical Impairment Barriers to Discharge:  Continued Medical Work up, [facility], [place] 01/10/2018, 11:27 AM

## 2018-01-10 NOTE — Progress Notes (Signed)
Patient has home BiPAP unit and places herself on and off as needed. She will call if any assistance is required. Patient was on and resting when checked.

## 2018-01-10 NOTE — Social Work (Signed)
Insurance authorization has been submitted for pt, authorization pending.   Judith [name], [place] Work [phone]

## 2018-01-10 NOTE — Discharge Instructions (Signed)

## 2018-01-10 NOTE — Progress Notes (Signed)
Patient ID: Judith [name], female   DOB: Feb 02, 1951, 67 y.o.   MRN: [id number] Patient is status post irrigation debridement wound left foot.  Patient should be safe for discharge to skilled nursing at this time Dial soap cleansing left foot dry dressing change daily minimize weightbearing left lower extremity.  I will follow-up in the office in 1 week.

## 2018-01-10 NOTE — Progress Notes (Signed)
Occupational Therapy Treatment Patient Details Name: Judith [name] MRN: [id number] DOB: [birth year] Today's Date: 01/10/2018    History of present illness Pt presented to [facility] with sepsis due to lt foot abscess. Pt developed acute respiratory failure due to pulmonary edema and was placed on bipap and transferred to [facility]. Pt underwent I&D of lt foot on 01/06/18. PMH - DM, CKD, HTN, CAD, rt transmetatarsal amputation, afib, rt shoulder surgery   OT comments  Pt demonstrating progress toward OT goals this session. She continues to require min assist for LB dressing tasks overall but was able to progress to min guard assist for lateral scoot simulated toilet transfers. Pt remains tangential but pleasant throughout session. She was on 1L O2 via [place] throughout session today. OT will continue to follow while admitted. D/C recommendation remains appropriate.    Follow Up Recommendations  SNF    Equipment Recommendations  Other (comment)(defer to next venue of care)    Recommendations for Other Services PT consult    Precautions / Restrictions Precautions Precautions: Fall Required Braces or Orthoses: Other Brace/Splint Other Brace/Splint: post op shoe lt foot Restrictions Weight Bearing Restrictions: Yes LLE Weight Bearing: Non weight bearing       Mobility Bed Mobility               General bed mobility comments: Seated at EOB on my arrival.   Transfers Overall transfer level: Needs assistance Equipment used: Rolling walker (2 wheeled) Transfers: Lateral/Scoot Transfers          Lateral/Scoot Transfers: Min guard General transfer comment: Guarding assist for safety.     Balance Overall balance assessment: Needs assistance Sitting-balance support: No upper extremity supported;Feet supported Sitting balance-Leahy Scale: Good     Standing balance support: Bilateral upper extremity supported Standing balance-Leahy Scale: Poor Standing balance comment:  Relies on BUE support and external assistance in standing.                            ADL either performed or assessed with clinical judgement   ADL Overall ADL's : Needs assistance/impaired     Grooming: Set up;Sitting               Lower Body Dressing: Sitting/lateral leans;Minimal assistance Lower Body Dressing Details (indicate cue type and reason): Able to don socks and post-op shoe without assistance seated at EOB; however, requires assistance for pants over hips.  Toilet Transfer: Min guard(lateral scoot to Curator Details (indicate cue type and reason): cues to maintain RLE NWB Toileting- Clothing Manipulation and Hygiene: Sitting/lateral lean;Total assistance         General ADL Comments: Pt with decreased ability to adhere to NWB status of RLE.      Vision       Perception     Praxis      Cognition Arousal/Alertness: Awake/alert Behavior During Therapy: WFL for tasks assessed/performed Overall Cognitive Status: No family/caregiver present to determine baseline cognitive functioning                                 General Comments: Pt with decreased problem solving skills and attention but likely close to baseline.         Exercises     Shoulder Instructions       General Comments Pt on 1L O2 throughout session.     Pertinent Vitals/ Pain  Pain Assessment: Faces Faces Pain Scale: Hurts a little bit Pain Location: LLE/foot Pain Descriptors / Indicators: Discomfort Pain Intervention(s): Monitored during session;Repositioned  Home Living                                          Prior Functioning/Environment              Frequency  Min 2X/week        Progress Toward Goals  OT Goals(current goals can now be found in the care plan section)  Progress towards OT goals: Progressing toward goals  Acute Rehab OT Goals Patient Stated Goal: get foot better OT Goal  Formulation: With patient Time For Goal Achievement: 01/22/18 Potential to Achieve Goals: Good  Plan Discharge plan remains appropriate    Co-evaluation                 AM-PAC PT "6 Clicks" Daily Activity     Outcome Measure   Help from another person eating meals?: None Help from another person taking care of personal grooming?: None Help from another person toileting, which includes using toliet, bedpan, or urinal?: A Lot Help from another person bathing (including washing, rinsing, drying)?: A Little Help from another person to put on and taking off regular upper body clothing?: None Help from another person to put on and taking off regular lower body clothing?: A Little 6 Click Score: 20    End of Session Equipment Utilized During Treatment: Gait belt;Other (comment)(post-op shoe)  OT Visit Diagnosis: Unsteadiness on feet (R26.81);Other abnormalities of gait and mobility (R26.89);Muscle weakness (generalized) (M62.81);Pain Pain - Right/Left: Left Pain - part of body: Ankle and joints of foot   Activity Tolerance Patient tolerated treatment well   Patient Left in chair;with call bell/phone within reach;with chair alarm set   Nurse Communication Mobility status        Time: 5462-7035 OT Time Calculation (min): 9 min  Charges: OT General Charges $OT Visit: 1 Visit OT Treatments $Self Care/Home Management : 8-22 mins  Belva Agee, OTR/L [place] Pager [phone] Office [place] 01/10/2018, 12:38 PM

## 2018-01-10 NOTE — Anesthesia Postprocedure Evaluation (Signed)
Anesthesia Post Note  Patient: Judith [name]  Procedure(s) Performed: DEBRIDEMENT ULCER LEFT FOOT (Left )     Patient location during evaluation: PACU Anesthesia Type: General Level of consciousness: awake and alert Pain management: pain level controlled Vital Signs Assessment: post-procedure vital signs reviewed and stable Respiratory status: spontaneous breathing, nonlabored ventilation, respiratory function stable and patient connected to nasal cannula oxygen Cardiovascular status: blood pressure returned to baseline and stable Postop Assessment: no apparent nausea or vomiting Anesthetic complications: no    Last Vitals:  Vitals:   01/09/18 2146 01/10/18 0519  BP:  140/77  Pulse: 71 (!) 56  Resp: 18 16  Temp:  (!) 36.3 C  SpO2: 100% 99%    Last Pain:  Vitals:   01/10/18 0745  TempSrc:   PainSc: 0-No pain                 Aceson Labell S

## 2018-01-10 NOTE — Progress Notes (Signed)
Met w/ pt upon referral b/c pt desired info on ADs.  Talked with her a bit about her life, then was present when MD rounded.   Provided AD packet when CSW was visiting and told pt I would return to answer any questions, barring any emergency calls, after her phone call was finished.    Mayra Reel, [place] Resident,  [phone]

## 2018-01-10 NOTE — Discharge Summary (Signed)
Physician Discharge Summary  Judith [name] QPY:[id number] DOB: [birth year] DOA: 01/02/2018  PCP: [name], PA-C  Admit date: 01/02/2018 Discharge date: 01/10/2018  Admitted From: Home  Disposition: SNF   Recommendations for Outpatient Follow-up:  1. Follow up with PCP in 1-2 weeks 2. Please obtain BMP/CBC in one week 3. Needs oral antibiotics for 4 weeks.  4. Follow up with Dr Sharol Given in 1 week.     Discharge Condition: Stable.  CODE STATUS: Full code.  Diet recommendation: Heart Healthy  Brief/Interim Summary: Brief Narrative   67 year old obese female with uncontrolled diabetes mellitus on insulin with diabetic polyneuropathy, chronic kidney disease stage III, hypertension, paroxysmal A. fib on Eliquis status post multiple DCCV in the past, hypertension, hyperlipidemia, CAD with history of PCI to DES, GERD, history of goiter, right foot amputation, OSA on BiPAP who presented to [facility] long hospital with fever and redness with increased swelling of the left foot that started draining clear liquid. Patient was placed on empiric IV antibiotics and orthopedics consulted.  Hospital stay complicated with development of acute respiratory failure with hypoxia secondary to pulmonary edema requiring high flow nasal cannula and BiPAP.  Next line patient transferred to [facility] for I&D of the left foot abscess.    Sepsis secondary to left leg cellulitis with abscess Vitals stable.  Continue empiric vancomycin and cefepime.  Pain control and PRN Tylenol. S/P I and D.  Follow culture growing streptococcus mitis. Sensitive to Doxycycline.  Discussed with Dr Sharol Given, patient will need 4 weeks of oral antibiotics. Discharge on Doxy for 4 weeks.   Acute respiratory failure with hypoxia (HCC) Acute diastolic CHF ([place]) Associated with pulmonary edema requiring BiPAP and high flow nasal cannula.   now on 3 L via nasal cannula.   2D echo shows EF of 55-60% with LVH with grade 2 diastolic  dysfunction. Improved on IV lasix,.  Change lasix to oral. 40 mg BID>  Negative 3.9  L.  Discgharge on oral lasix.   Constipation; resolved No BM in several days.  Bowel regimen.  KUB negative.  She had several Bowel movement.    Type II diabetes mellitus, uncontrolled with nephropathy and polyneuropathy  (HCC) A1c of 8.7.  Continue Lantus at bedtime.  Currently on sliding scale coverage with stable CBG.  Chronic kidney disease stage III Renal function stable. Stable.   Coronary artery disease Asymptomatic.  Continue statin.  Permanent A. fib Rate controlled on the monitor.  Continue sotalol and Eliquis.  HR elevated yesterday, received dose of metoprolol   Essential hypertension Continue amlodipine.  Anemia  baseline around 12. Iron low, B 12 454 Started  iron supplement.   Hypomagnesemia Replenished     Discharge Diagnoses:  Principal Problem:   Cellulitis Active Problems:   GERD (gastroesophageal reflux disease)   Hyperthyroidism   Obstructive sleep apnea   S/P transmetatarsal amputation of foot, right (HCC)   Type II diabetes mellitus, uncontrolled ([place])   Peripheral neuropathy   Acute respiratory failure with hypoxia (HCC)   Cutaneous abscess of left foot    Discharge Instructions  Discharge Instructions    Change dressing   Complete by:  As directed    Wash left foot with soap and water daily apply dry dressing daily.   Diet - low sodium heart healthy   Complete by:  As directed    Increase activity slowly   Complete by:  As directed    Touch down weight bearing   Complete by:  As directed  Laterality:  left   Extremity:  Lower     Allergies as of 01/10/2018      Reactions   Contrast Media [iodinated Diagnostic Agents] Hives   Spoke to patient, Iodine allergy is really IV contrast allergy.    Dilaudid [hydromorphone Hcl] Other (See Comments)   HEADACHE   Novolog [insulin Aspart] Shortness Of Breath   "breathing problems"    Codeine Nausea And Vomiting   HIGH DOSES-SEVERE VOMITING   Iodine Other (See Comments)   MUST HAVE BENADRYL PRIOR TO PROCEDURE AND RIGHT BEFORE TREATMENT TO COUNTERACT REACTION-BLISTERING REACTION DERMATOLOGICAL   Penicillins Itching, Rash, Other (See Comments)   Has patient had a PCN reaction causing immediate rash, facial/tongue/throat swelling, SOB or lightheadedness with hypotension: no Has patient had a PCN reaction causing severe rash involving mucus membranes or skin necrosis: No Has patient had a PCN reaction that required hospitalization No Has patient had a PCN reaction occurring within the last 10 years: No If all of the above answers are "NO", then may proceed with Cephalosporin use. CHEST SIZED RASH AND ITCHING   Propofol Other (See Comments)   "Breathing problems - asthma attack" Can take with benadryl   Ace Inhibitors Cough   Demerol [meperidine] Nausea And Vomiting   Neosporin [neomycin-bacitracin Zn-polymyx] Itching, Rash   MAKES REACTIONS WORSE WHEN USING AS PROPHYLACTIC   Percocet [oxycodone-acetaminophen] Rash   Tape Itching, Rash      Medication List    STOP taking these medications   cephALEXin 500 MG capsule Commonly known as:  KEFLEX   insulin lispro 100 UNIT/ML KiwkPen Commonly known as:  HUMALOG   silver sulfADIAZINE 1 % cream Commonly known as:  SILVADENE     TAKE these medications   albuterol 108 (90 Base) MCG/ACT inhaler Commonly known as:  PROVENTIL HFA;VENTOLIN HFA Inhale 1-2 puffs into the lungs every 6 (six) hours as needed for wheezing or shortness of breath.   albuterol (2.5 MG/3ML) 0.083% nebulizer solution Commonly known as:  PROVENTIL Take 3 mLs (2.5 mg total) by nebulization every 6 (six) hours as needed for wheezing or shortness of breath.   amLODipine 5 MG tablet Commonly known as:  NORVASC Take 1 tablet (5 mg total) by mouth daily. Start taking on:  01/11/2018 What changed:    medication strength  how much to take    apixaban 5 MG Tabs tablet Commonly known as:  ELIQUIS Take 1 tablet (5 mg total) by mouth 2 (two) times daily.   atorvastatin 20 MG tablet Commonly known as:  LIPITOR Take 1 tablet (20 mg total) by mouth daily.   B-D SINGLE USE SWABS REGULAR Pads 1 each by Does not apply route 2 (two) times daily.   buPROPion 150 MG 12 hr tablet Commonly known as:  WELLBUTRIN SR Take 150 mg by mouth 2 (two) times daily. What changed:  Another medication with the same name was removed. Continue taking this medication, and follow the directions you see here.   doxycycline 100 MG tablet Commonly known as:  VIBRA-TABS Take 1 tablet (100 mg total) by mouth every 12 (twelve) hours. What changed:  when to take this   fenofibrate 145 MG tablet Commonly known as:  TRICOR Take 1 tablet (145 mg total) by mouth daily.   ferrous sulfate 325 (65 FE) MG tablet Take 1 tablet (325 mg total) by mouth 2 (two) times daily with a meal.   fluconazole 100 MG tablet Commonly known as:  DIFLUCAN Take 1 tablet (100 mg total)  by mouth daily as needed (yeast infection).   FLUoxetine 20 MG capsule Commonly known as:  PROZAC Take 1 capsule (20 mg total) by mouth at bedtime.   furosemide 40 MG tablet Commonly known as:  LASIX Take 1 tablet (40 mg total) by mouth 2 (two) times daily.   gabapentin 300 MG capsule Commonly known as:  NEURONTIN Take 3 capsules (900 mg total) by mouth 2 (two) times daily. TAKE 3 CAPSULES BY MOUTH EVERY  MORNING AND 3 CAPSULES AT BEDTIME What changed:    how much to take  additional instructions   glucose blood test strip Used to check blood sugars 2x daily.   HYDROcodone-acetaminophen 5-325 MG tablet Commonly known as:  NORCO/VICODIN Take 1-2 tablets by mouth every 4 (four) hours as needed for moderate pain (pain score 4-6).   insulin degludec 100 UNIT/ML Sopn FlexTouch Pen Commonly known as:  TRESIBA Inject 0.2 mLs (20 Units total) into the skin daily at 10 pm.   Insulin  Pen Needle 32G X 4 MM Misc Use to inject insulin daily   INSULIN SYRINGE .5CC/31GX5/16" 31G X 5/16" 0.5 ML Misc Use to inject insulin   magnesium oxide 400 (241.3 Mg) MG tablet Commonly known as:  MAG-OX Take 1 tablet (400 mg total) by mouth daily.   metFORMIN 500 MG 24 hr tablet Commonly known as:  GLUCOPHAGE-XR Take 4 tablets (2,000 mg total) by mouth daily with breakfast. What changed:    how much to take  when to take this   methimazole 10 MG tablet Commonly known as:  TAPAZOLE Take 1 tablet (10 mg total) by mouth daily.   metoprolol succinate 25 MG 24 hr tablet Commonly known as:  TOPROL-XL Take 1 tablet (25 mg total) by mouth 2 (two) times daily. What changed:    when to take this  additional instructions   nitroGLYCERIN 0.4 MG SL tablet Commonly known as:  NITROSTAT Place 1 tablet (0.4 mg total) under the tongue every 5 (five) minutes as needed for chest pain.   pantoprazole 40 MG tablet Commonly known as:  PROTONIX Take 1 tablet (40 mg total) by mouth daily.   polyethylene glycol packet Commonly known as:  MIRALAX / GLYCOLAX Take 17 g by mouth daily as needed.   potassium chloride SA 20 MEQ tablet Commonly known as:  K-DUR,KLOR-CON Take 1 tablet (20 mEq total) by mouth 2 (two) times daily.   sotalol 120 MG tablet Commonly known as:  BETAPACE Take 1 tablet (120 mg total) by mouth every 12 (twelve) hours.   traMADol 50 MG tablet Commonly known as:  ULTRAM Take 1 tablet (50 mg total) by mouth every 8 (eight) hours as needed. What changed:  reasons to take this   triamcinolone cream 0.1 % Commonly known as:  KENALOG Apply 1 application topically 2 (two) times daily. What changed:    when to take this  reasons to take this   TRUE METRIX AIR GLUCOSE METER w/Device Kit 1 each by Does not apply route daily.   TRUE METRIX LEVEL 1 Low Soln 1 each by In Vitro route as needed.   TRUEPLUS LANCETS 28G Misc 1 each by Does not apply route 2 (two) times  daily.            Discharge Care Instructions  (From admission, onward)         Start     Ordered   01/10/18 0000  Change dressing    Comments:  Wash left foot with soap  and water daily apply dry dressing daily.   01/10/18 0837   01/10/18 0000  Touch down weight bearing    Question Answer Comment  Laterality left   Extremity Lower      01/10/18 0837         Follow-up Information    [name], MD In 1 week.   Specialty:  Orthopedic Surgery Contact information: [address] [place] [address] [phone]          Allergies  Allergen Reactions  . Contrast Media [Iodinated Diagnostic Agents] Hives    Spoke to patient, Iodine allergy is really IV contrast allergy.   . Dilaudid [Hydromorphone Hcl] Other (See Comments)    HEADACHE  . Novolog [Insulin Aspart] Shortness Of Breath    "breathing problems"  . Codeine Nausea And Vomiting    HIGH DOSES-SEVERE VOMITING  . Iodine Other (See Comments)    MUST HAVE BENADRYL PRIOR TO PROCEDURE AND RIGHT BEFORE TREATMENT TO COUNTERACT REACTION-BLISTERING REACTION DERMATOLOGICAL  . Penicillins Itching, Rash and Other (See Comments)    Has patient had a PCN reaction causing immediate rash, facial/tongue/throat swelling, SOB or lightheadedness with hypotension: no Has patient had a PCN reaction causing severe rash involving mucus membranes or skin necrosis: No Has patient had a PCN reaction that required hospitalization No Has patient had a PCN reaction occurring within the last 10 years: No If all of the above answers are "NO", then may proceed with Cephalosporin use.  CHEST SIZED RASH AND ITCHING   . Propofol Other (See Comments)    "Breathing problems - asthma attack" Can take with benadryl   . Ace Inhibitors Cough  . Demerol [Meperidine] Nausea And Vomiting  . Neosporin [Neomycin-Bacitracin Zn-Polymyx] Itching and Rash    MAKES REACTIONS WORSE WHEN USING AS PROPHYLACTIC  . Percocet  [Oxycodone-Acetaminophen] Rash  . Tape Itching and Rash    Consultations:  Dr.  Sharol Given   Procedures/Studies: Dg Abd 1 View  Result Date: 01/07/2018 CLINICAL DATA:  67 year old female with an 8 day history of constipation EXAM: ABDOMEN - 1 VIEW COMPARISON:  CT scan of the abdomen and pelvis 08/07/2017 FINDINGS: The bowel gas pattern is not obstructed. The colonic stool burden is unremarkable. The stomach is mildly distended with gas. No acute osseous abnormality. Small amount of high density material is noted within a diverticulum along the medial margin of the descending colon. IMPRESSION: Negative. Electronically Signed   By: Jacqulynn Cadet M.D.   On: 01/07/2018 15:41   Mr Foot Left Wo Contrast  Result Date: 01/03/2018 CLINICAL DATA:  Left foot pain and swelling in a diabetic patient. Failure to respond to antibiotic treatment. EXAM: MRI OF THE LEFT FOOT WITHOUT CONTRAST TECHNIQUE: Multiplanar, multisequence MR imaging of the left foot was performed. No intravenous contrast was administered. COMPARISON:  Plain films left foot 01/03/2018. FINDINGS: Bones/Joint/Cartilage Bone marrow signal is normal without evidence of osteomyelitis, fracture or other focal lesion. Ligaments Intact. Muscles and Tendons Intermediate increased T2 signal and mild fatty atrophy of intrinsic musculature of the foot are consistent with diabetic myopathy. Soft tissues Subcutaneous edema is present over the dorsum of the foot with scattered locules of gas present, most notable over the third MTP joint. Subcutaneous edema about the toes is worst in the third toe. No notable joint effusion is identified. IMPRESSION: Extensive subcutaneous soft tissue edema in the dorsum of the foot with locules soft tissue gas as seen on plain films consistent with cellulitis. Negative for osteomyelitis or septic  joint. Subcutaneous edema about the toes is worst in the third toe but no abscess is identified on this noncontrast exam.  Electronically Signed   By: Inge Rise M.D.   On: 01/03/2018 10:21   Dg Chest Port 1 View  Result Date: 01/05/2018 CLINICAL DATA:  Pneumonia EXAM: PORTABLE CHEST 1 VIEW COMPARISON:  01/04/2018 FINDINGS: Cardiac shadow is enlarged but stable. The lungs are well aerated bilaterally. No focal infiltrate or sizable effusion is seen. No bony abnormality is noted. IMPRESSION: No acute abnormality seen. Electronically Signed   By: Inez Catalina M.D.   On: 01/05/2018 10:47   Dg Chest Port 1 View  Result Date: 01/04/2018 CLINICAL DATA:  Shortness of Breath EXAM: PORTABLE CHEST 1 VIEW COMPARISON:  01/03/2018 FINDINGS: Cardiac shadow is within normal limits. The lungs are well aerated bilaterally. The degree of vascular congestion has improved in the interval. No sizable effusion or infiltrate is seen. Old rib fractures are noted on the right. IMPRESSION: No acute abnormality noted. Electronically Signed   By: Inez Catalina M.D.   On: 01/04/2018 08:20   Dg Chest Port 1 View  Result Date: 01/03/2018 CLINICAL DATA:  Acute respiratory failure, hypoxia EXAM: PORTABLE CHEST 1 VIEW COMPARISON:  01/03/2018 FINDINGS: Cardiomegaly with vascular congestion. Mild interstitial prominence throughout the lungs could reflect interstitial edema. No confluent opacities or effusions. IMPRESSION: Cardiomegaly with vascular congestion and interstitial prominence, likely interstitial edema. Electronically Signed   By: Rolm Baptise M.D.   On: 01/03/2018 19:34   Dg Chest Port 1 View  Result Date: 01/03/2018 CLINICAL DATA:  Chest pain EXAM: PORTABLE CHEST 1 VIEW COMPARISON:  11/04/2017 FINDINGS: Interstitial coarsening with Kerley lines. Chronic cardiomegaly. No air bronchogram, effusion, or pneumothorax. IMPRESSION: CHF pattern. Electronically Signed   By: Monte Fantasia M.D.   On: 01/03/2018 09:49   Dg Foot Complete Left  Result Date: 01/02/2018 CLINICAL DATA:  Cellulitis. On antibiotics for 2 days, worsening symptoms.  EXAM: LEFT FOOT - COMPLETE 3+ VIEW COMPARISON:  None. FINDINGS: No acute fracture deformity or dislocation. No destructive bony lesions. Moderate plantar calcaneal spur. Calcification at Achilles insertion seen with old injury. Dorsal foot soft tissue swelling with forefoot dorsal subcutaneous gas. IMPRESSION: 1. Soft tissue swelling with dorsal forefoot subcutaneous gas concerning for necrotizing fasciitis. 2. No acute osseous process. Acute findings discussed with and reconfirmed by PA.MICHAEL MACZIS on 01/02/2018 at 1:49 pm. Electronically Signed   By: Elon Alas M.D.   On: 01/02/2018 13:49   Dg Foot Complete Right  Result Date: 12/31/2017 CLINICAL DATA:  foot sweling, eval for subq air, bone destruction, FB. Diabetic EXAM: RIGHT FOOT COMPLETE - 3+ VIEW COMPARISON:  None. FINDINGS: Osseous alignment is normal. Bone mineralization is normal. No fracture line or displaced fracture fragment. No acute or suspicious osseous lesion. No destructive change to suggest osteomyelitis. No significant degenerative change. No soft tissue gas. No foreign body appreciated within the soft tissues. Incidental note made of chronic osseous spurring at the plantar margin of the posterior calcaneus and linear calcifications at the Achilles tendon insertion site suggesting chronic calcific tendinopathy. IMPRESSION: No osseous abnormality.  No soft tissue gas.  No foreign body seen. Electronically Signed   By: Franki Cabot M.D.   On: 12/31/2017 16:38     Subjective: Feeling well, breathing better.    Discharge Exam: Vitals:   01/10/18 0519 01/10/18 1034  BP: 140/77 (!) 134/57  Pulse: (!) 56 61  Resp: 16   Temp: (!) 97.3 F (36.3 C)  SpO2: 99%    Vitals:   01/09/18 2107 01/09/18 2146 01/10/18 0519 01/10/18 1034  BP: (!) 150/70  140/77 (!) 134/57  Pulse: 63 71 (!) 56 61  Resp: _0 Temp: 98.4 F (36.9 C)  (!) 97.3 F (36.3 C)   TempSrc: Axillary  Oral   SpO2: 98% 100% 99%   Weight:       Height:        General: Pt is alert, awake, not in acute distress Cardiovascular: RRR, S1/S2 +, no rubs, no gallops Respiratory: CTA bilaterally, no wheezing, no rhonchi Abdominal: Soft, NT, ND, bowel sounds + Extremities: no edema, no cyanosis, Left LE with dressing.     The results of significant diagnostics from this hospitalization (including imaging, microbiology, ancillary and laboratory) are listed below for reference.     Microbiology: Recent Results (from the past 240 hour(s))  WOUND CULTURE     Status: Abnormal   Collection Time: 12/31/17  4:40 PM  Result Value Ref Range Status   Gram Stain Result Final report  Final   Organism ID, Bacteria Comment  Final    Comment: No white blood cells seen.   Organism ID, Bacteria Comment  Final    Comment: Few gram positive cocci   Organism ID, Bacteria Comment  Final    Comment: Few gram negative diplococci.   Aerobic Bacterial Culture Final report (A)  Final   Organism ID, Bacteria Staphylococcus aureus (A)  Final    Comment: Heavy growth Based on susceptibility to oxacillin this isolate would be susceptible to: *Penicillinase-stable penicillins, such as:   Cloxacillin, Dicloxacillin, Nafcillin *Beta-lactam combination agents, such as:   Amoxicillin-clavulanic acid, Ampicillin-sulbactam,   Piperacillin-tazobactam *Oral cephems, such as:   Cefaclor, Cefdinir, Cefpodoxime, Cefprozil, Cefuroxime,   Cephalexin, Loracarbef *Parenteral cephems, such as:   Cefazolin, Cefepime, Cefotaxime, Cefotetan, Ceftaroline,   Ceftizoxime, Ceftriaxone, Cefuroxime *Carbapenems, such as:   Doripenem, Ertapenem, Imipenem, Meropenem    Organism ID, Bacteria Comment  Final    Comment: Multiple negative rods present, none predominant. This is indicative of a heavily colonized/contaminated specimen site. No further microbiological characterization will be done as it will not provide clinically relevant information. Scant growth    Organism  ID, Bacteria Mixed skin flora  Final    Comment: Moderate growth   Antimicrobial Susceptibility Comment  Final    Comment:       ** S = Susceptible; I = Intermediate; R = Resistant **                    P = Positive; N = Negative             MICS are expressed in micrograms per mL    Antibiotic                 RSLT#1    RSLT#2    RSLT#3    RSLT#4 Ciprofloxacin                  S Clindamycin                    S Erythromycin                   S Gentamicin                     S Levofloxacin  S Linezolid                      S Moxifloxacin                   S Oxacillin                      S Penicillin                     R Quinupristin/Dalfopristin      S Rifampin                       S Tetracycline                   S Trimethoprim/Sulfa             S Vancomycin                     S   Culture, blood (routine x 2)     Status: None   Collection Time: 01/02/18  2:13 PM  Result Value Ref Range Status   Specimen Description   Final    BLOOD RIGHT WRIST Performed at [facility] [address] [place], [place] [address]    Special Requests   Final    BOTTLES DRAWN AEROBIC AND ANAEROBIC Blood Culture adequate volume Performed at [facility] [address] [place], [place] [address]    Culture   Final    NO GROWTH 5 DAYS Performed at [facility], [place] [address], [place], [place] [address]    Report Status 01/07/2018 FINAL  Final  Culture, blood (routine x 2)     Status: None   Collection Time: 01/02/18  2:15 PM  Result Value Ref Range Status   Specimen Description   Final    BLOOD LEFT HAND Performed at [facility] [address] [place], [place] [address]    Special Requests   Final    BOTTLES DRAWN AEROBIC AND ANAEROBIC Blood Culture adequate volume Performed at [facility] [address], [place], [place] [address]    Culture   Final    NO GROWTH 5 DAYS Performed  at [facility], [place] [address] [place], [place] [address]    Report Status 01/07/2018 FINAL  Final  MRSA PCR Screening     Status: None   Collection Time: 01/05/18  2:14 PM  Result Value Ref Range Status   MRSA by PCR NEGATIVE NEGATIVE Final    Comment:        The GeneXpert MRSA Assay (FDA approved for NASAL specimens only), is one component of a comprehensive MRSA colonization surveillance program. It is not intended to diagnose MRSA infection nor to guide or monitor treatment for MRSA infections. Performed at [facility], [place] [address], [place], [place] [address]   Aerobic/Anaerobic Culture (surgical/deep wound)     Status: None (Preliminary result)   Collection Time: 01/06/18  4:13 PM  Result Value Ref Range Status   Specimen Description TISSUE LEFT FOOT ULCER  Final   Special Requests NONE  Final   Gram Stain   Final    MODERATE WBC PRESENT, PREDOMINANTLY PMN RARE GRAM POSITIVE COCCI    Culture   Final    RARE STREPTOCOCCUS MITIS/ORALIS NO ANAEROBES ISOLATED; CULTURE IN PROGRESS FOR 5 DAYS  Report Status PENDING  Incomplete   Organism ID, Bacteria STREPTOCOCCUS MITIS/ORALIS  Final      Susceptibility   Streptococcus mitis/oralis - MIC*    TETRACYCLINE 0.5 SENSITIVE Sensitive     VANCOMYCIN 0.5 SENSITIVE Sensitive     CLINDAMYCIN Value in next row Sensitive      <=0.25 SENSITIVEPerformed at [facility], [address] [place], [place] [address]    * RARE STREPTOCOCCUS MITIS/ORALIS     Labs: BNP (last 3 results) No results for input(s): BNP in the last 8760 hours. Basic Metabolic Panel: Recent Labs  Lab 01/04/18 0331 01/05/18 0321 01/07/18 0452 01/08/18 0548 01/09/18 0452  NA 138 137 131* 133* 136  K 4.1 3.6 4.3 4.6 4.9  CL 99 99 93* 95* 97*  CO2 _0 GLUCOSE 167* 122* 223* 196* 138*  BUN 26* _1 CREATININE 1.01* 1.06* 1.25* 1.18* 1.25*  1.27*  CALCIUM 9.1 9.1 9.3 9.2 9.3  MG  --  1.6*  --   --    --   PHOS  --  2.3*  --   --   --    Liver Function Tests: Recent Labs  Lab 01/05/18 0321  ALBUMIN 2.4*   No results for input(s): LIPASE, AMYLASE in the last 168 hours. No results for input(s): AMMONIA in the last 168 hours. CBC: Recent Labs  Lab 01/04/18 0331 01/05/18 0321 01/07/18 0452 01/08/18 0548 01/09/18 0452  WBC 14.2* 14.5* 12.9* 12.1* 10.0  NEUTROABS  --  11.8*  --   --   --   HGB 9.2* 8.4* 9.3* 9.1* 8.8*  HCT 28.7* 25.9* 29.9* 29.5* 29.1*  MCV 85.2 84.6 85.9 86.8 87.9  PLT 499* 544* 596* 614* 620*   Cardiac Enzymes: No results for input(s): CKTOTAL, CKMB, CKMBINDEX, TROPONINI in the last 168 hours. BNP: Invalid input(s): POCBNP CBG: Recent Labs  Lab 01/09/18 0803 01/09/18 1215 01/09/18 1727 01/09/18 2104 01/10/18 0823  GLUCAP 133* 187* 225* 279* 99   D-Dimer No results for input(s): DDIMER in the last 72 hours. Hgb A1c No results for input(s): HGBA1C in the last 72 hours. Lipid Profile No results for input(s): CHOL, HDL, LDLCALC, TRIG, CHOLHDL, LDLDIRECT in the last 72 hours. Thyroid function studies No results for input(s): TSH, T4TOTAL, T3FREE, THYROIDAB in the last 72 hours.  Invalid input(s): FREET3 Anemia work up No results for input(s): VITAMINB12, FOLATE, FERRITIN, TIBC, IRON, RETICCTPCT in the last 72 hours. Urinalysis    Component Value Date/Time   COLORURINE AMBER (A) 01/04/2018 2244   APPEARANCEUR CLOUDY (A) 01/04/2018 2244   LABSPEC 1.025 01/04/2018 2244   PHURINE 5.0 01/04/2018 2244   GLUCOSEU 50 (A) 01/04/2018 2244   HGBUR SMALL (A) 01/04/2018 2244   BILIRUBINUR NEGATIVE 01/04/2018 2244   BILIRUBINUR negative 07/22/2017 0858   BILIRUBINUR Small 12/06/2014 1441   KETONESUR 5 (A) 01/04/2018 2244   PROTEINUR 100 (A) 01/04/2018 2244   UROBILINOGEN 0.2 07/22/2017 0858   UROBILINOGEN 1.0 02/12/2015 1238   NITRITE NEGATIVE 01/04/2018 2244   LEUKOCYTESUR NEGATIVE 01/04/2018 2244   Sepsis Labs Invalid input(s): PROCALCITONIN,   WBC,  LACTICIDVEN Microbiology Recent Results (from the past 240 hour(s))  WOUND CULTURE     Status: Abnormal   Collection Time: 12/31/17  4:40 PM  Result Value Ref Range Status   Gram Stain Result Final report  Final   Organism ID, Bacteria Comment  Final    Comment: No white blood cells seen.  Organism ID, Bacteria Comment  Final    Comment: Few gram positive cocci   Organism ID, Bacteria Comment  Final    Comment: Few gram negative diplococci.   Aerobic Bacterial Culture Final report (A)  Final   Organism ID, Bacteria Staphylococcus aureus (A)  Final    Comment: Heavy growth Based on susceptibility to oxacillin this isolate would be susceptible to: *Penicillinase-stable penicillins, such as:   Cloxacillin, Dicloxacillin, Nafcillin *Beta-lactam combination agents, such as:   Amoxicillin-clavulanic acid, Ampicillin-sulbactam,   Piperacillin-tazobactam *Oral cephems, such as:   Cefaclor, Cefdinir, Cefpodoxime, Cefprozil, Cefuroxime,   Cephalexin, Loracarbef *Parenteral cephems, such as:   Cefazolin, Cefepime, Cefotaxime, Cefotetan, Ceftaroline,   Ceftizoxime, Ceftriaxone, Cefuroxime *Carbapenems, such as:   Doripenem, Ertapenem, Imipenem, Meropenem    Organism ID, Bacteria Comment  Final    Comment: Multiple negative rods present, none predominant. This is indicative of a heavily colonized/contaminated specimen site. No further microbiological characterization will be done as it will not provide clinically relevant information. Scant growth    Organism ID, Bacteria Mixed skin flora  Final    Comment: Moderate growth   Antimicrobial Susceptibility Comment  Final    Comment:       ** S = Susceptible; I = Intermediate; R = Resistant **                    P = Positive; N = Negative             MICS are expressed in micrograms per mL    Antibiotic                 RSLT#1    RSLT#2    RSLT#3    RSLT#4 Ciprofloxacin                  S Clindamycin                     S Erythromycin                   S Gentamicin                     S Levofloxacin                   S Linezolid                      S Moxifloxacin                   S Oxacillin                      S Penicillin                     R Quinupristin/Dalfopristin      S Rifampin                       S Tetracycline                   S Trimethoprim/Sulfa             S Vancomycin                     S   Culture, blood (routine x 2)     Status: None   Collection Time: 01/02/18  2:13 PM  Result Value Ref Range  Status   Specimen Description   Final    BLOOD RIGHT WRIST Performed at [facility] [address] [place], [place] [address]    Special Requests   Final    BOTTLES DRAWN AEROBIC AND ANAEROBIC Blood Culture adequate volume Performed at [facility] [address], [place], [place] [address]    Culture   Final    NO GROWTH 5 DAYS Performed at [facility], [place] [address] [place], [place] [address]    Report Status 01/07/2018 FINAL  Final  Culture, blood (routine x 2)     Status: None   Collection Time: 01/02/18  2:15 PM  Result Value Ref Range Status   Specimen Description   Final    BLOOD LEFT HAND Performed at [facility] [address], [place], [place] [address]    Special Requests   Final    BOTTLES DRAWN AEROBIC AND ANAEROBIC Blood Culture adequate volume Performed at [facility] [address], [place], [place] [address]    Culture   Final    NO GROWTH 5 DAYS Performed at [facility], [place] [address] [place], [place] [address]    Report Status 01/07/2018 FINAL  Final  MRSA PCR Screening     Status: None   Collection Time: 01/05/18  2:14 PM  Result Value Ref Range Status   MRSA by PCR NEGATIVE NEGATIVE Final    Comment:        The GeneXpert MRSA Assay (FDA approved for NASAL specimens only), is one component of a comprehensive MRSA colonization surveillance  program. It is not intended to diagnose MRSA infection nor to guide or monitor treatment for MRSA infections. Performed at [facility], [place] [address], [place], [place] [address]   Aerobic/Anaerobic Culture (surgical/deep wound)     Status: None (Preliminary result)   Collection Time: 01/06/18  4:13 PM  Result Value Ref Range Status   Specimen Description TISSUE LEFT FOOT ULCER  Final   Special Requests NONE  Final   Gram Stain   Final    MODERATE WBC PRESENT, PREDOMINANTLY PMN RARE GRAM POSITIVE COCCI    Culture   Final    RARE STREPTOCOCCUS MITIS/ORALIS NO ANAEROBES ISOLATED; CULTURE IN PROGRESS FOR 5 DAYS    Report Status PENDING  Incomplete   Organism ID, Bacteria STREPTOCOCCUS MITIS/ORALIS  Final      Susceptibility   Streptococcus mitis/oralis - MIC*    TETRACYCLINE 0.5 SENSITIVE Sensitive     VANCOMYCIN 0.5 SENSITIVE Sensitive     CLINDAMYCIN Value in next row Sensitive      <=0.25 SENSITIVEPerformed at [facility] [address] [place],  [address]    * RARE STREPTOCOCCUS MITIS/ORALIS     Time coordinating discharge: 67 minues  SIGNED:   Elmarie Shiley, MD  [facility] 01/10/2018, 11:17 AM Pager   If 7PM-7AM, please contact night-coverage [email or web address] Password [email or web address]

## 2018-01-10 NOTE — NC FL2 (Signed)
[place] MEDICAID FL2 LEVEL OF CARE SCREENING TOOL     IDENTIFICATION  Patient Name: Judith [name] Birthdate: [birth year] Sex: female Admission Date (Current Location): 01/02/2018  [facility] and [place] Number:  Herbalist and Address:  [facility], [place] [address] [place], [place] [address]      Provider Number: [id number]  Attending Physician Name and Address:  Elmarie Shiley, MD  Relative Name and Phone Number:  Simaya Lumadue, daughter, [phone]    Current Level of Care: Hospital Recommended Level of Care: [place] Prior Approval Number:    Date Approved/Denied:   PASRR Number: [id number]  Discharge Plan: SNF    Current Diagnoses: Patient Active Problem List   Diagnosis Date Noted  . Cutaneous abscess of left foot   . Acute respiratory failure with hypoxia ([place])   . Cellulitis 01/02/2018  . LGI bleed   . Acute blood loss anemia   . Wide-complex tachycardia ([place]) 07/04/2017  . Type II diabetes mellitus, uncontrolled ([place]) 07/04/2017  . Peripheral neuropathy 07/04/2017  . H/O hyperthyroidism 07/04/2017  . S/P transmetatarsal amputation of foot, right ([place]) 04/19/2016  . Obstructive sleep apnea 11/26/2015  . Bilateral carpal tunnel syndrome 11/26/2015  . Hypomagnesemia 11/16/2015  . Hyperlipidemia LDL goal <70 10/13/2015  . Abnormality of gait 09/02/2015  . Memory loss 08/12/2015  . Diabetic peripheral neuropathy ([place]) 08/12/2015  . Vitamin D deficiency 08/12/2015  . Hyperthyroidism 04/30/2015  . Heme positive stool   . Persistent atrial fibrillation ([place]) 04/29/2015  . Coronary artery disease with stable angina pectoris ([place]) 03/29/2015  . Abnormal nuclear stress test   . History of goiter 09/28/2014  . GERD (gastroesophageal reflux disease) 07/31/2013  . Depression with anxiety 06/01/2013  . DM2 (diabetes mellitus, type 2) ([place]) 01/30/2013  . HTN (hypertension) 01/30/2013    Orientation RESPIRATION  BLADDER Height & Weight     Self, Time, Situation, Place  Normal, Other (Comment)(CPAP) Incontinent, External catheter Weight: 176 lb 9.4 oz (80.1 kg) Height:  5\' 4"  (162.6 cm)  BEHAVIORAL SYMPTOMS/MOOD NEUROLOGICAL BOWEL NUTRITION STATUS      Continent Diet(carb modified; please see DC summary)  AMBULATORY STATUS COMMUNICATION OF NEEDS Skin   Extensive Assist Verbally Surgical wounds, Other (Comment)(closed incision L foot, L leg, R leg (debridement L foot))                       Personal Care Assistance Level of Assistance  Bathing, Feeding, Dressing Bathing Assistance: Limited assistance Feeding assistance: Independent Dressing Assistance: Limited assistance     Functional Limitations Info  Sight, Hearing, Speech Sight Info: Impaired Hearing Info: Adequate Speech Info: Adequate    SPECIAL CARE FACTORS FREQUENCY  PT (By licensed PT), OT (By licensed OT)     PT Frequency: 5x/week OT Frequency: 5x/week            Contractures Contractures Info: Not present    Additional Factors Info  Code Status, Allergies, Psychotropic, Insulin Sliding Scale Code Status Info: Full Allergies Info: Contrast Media Iodinated Diagnostic Agents, Dilaudid Hydromorphone Hcl, Novolog Insulin Aspart, Codeine, Iodine, Penicillins, Propofol, Ace Inhibitors, Demerol Meperidine, Neosporin Neomycin-bacitracin Zn-polymyx, Percocet Oxycodone-acetaminophen, Tape Psychotropic Info: wellbutrin, prozac Insulin Sliding Scale Info: lantus at bedtime, humalog 3x/day with meals and at bedtime       Current Medications (01/10/2018):  This is the current hospital active medication list Current Facility-Administered Medications  Medication Dose Route Frequency Provider Last Rate Last Dose  . 0.9 %  sodium chloride infusion  250 mL Intravenous PRN [name], MD 0 mL/hr at 01/10/18 [phone]    . 0.9 %  sodium chloride infusion   Intravenous Continuous [name], MD 10 mL/hr at 01/10/18 [phone]    .  acetaminophen (TYLENOL) tablet 650 mg  650 mg Oral Q6H PRN [name], MD   650 mg at 01/08/18 5409   Or  . acetaminophen (TYLENOL) suppository 650 mg  650 mg Rectal Q6H PRN [name], MD      . albuterol (PROVENTIL) (2.5 MG/3ML) 0.083% nebulizer solution 2.5 mg  2.5 mg Nebulization Q6H PRN [name], MD   2.5 mg at 01/06/18 0425  . amLODipine (NORVASC) tablet 5 mg  5 mg Oral Daily [name], MD   5 mg at 01/09/18 0910  . apixaban (ELIQUIS) tablet 5 mg  5 mg Oral BID [name], MD   5 mg at 01/09/18 2127  . atorvastatin (LIPITOR) tablet 20 mg  20 mg Oral QPM [name], MD   20 mg at 01/09/18 1739  . buPROPion (WELLBUTRIN SR) 12 hr tablet 150 mg  150 mg Oral BID [name], MD   150 mg at 01/09/18 2127  . ceFEPIme (MAXIPIME) 2 g in sodium chloride 0.9 % 100 mL IVPB  2 g Intravenous Q24H Rumbarger, Valeda Malm, RPH      . chlorhexidine (PERIDEX) 0.12 % solution 15 mL  15 mL Mouth Rinse BID [name], MD   15 mL at 01/09/18 2127  . docusate sodium (COLACE) capsule 100 mg  100 mg Oral BID [name], MD   100 mg at 01/09/18 2127  . fenofibrate tablet 160 mg  160 mg Oral Daily [name], MD   160 mg at 01/09/18 0911  . ferrous sulfate tablet 325 mg  325 mg Oral BID WC Regalado, Belkys A, MD   325 mg at 01/10/18 0837  . FLUoxetine (PROZAC) capsule 20 mg  20 mg Oral QHS [name], MD   20 mg at 01/09/18 2127  . furosemide (LASIX) tablet 40 mg  40 mg Oral BID Regalado, Belkys A, MD   40 mg at 01/10/18 0837  . gabapentin (NEURONTIN) capsule 600 mg  600 mg Oral BID [name], MD   600 mg at 01/09/18 2127  . HYDROcodone-acetaminophen (NORCO/VICODIN) 5-325 MG per tablet 1-2 tablet  1-2 tablet Oral Q4H PRN [name], MD      . insulin glargine (LANTUS) injection 20 Units  20 Units Subcutaneous QHS [name], MD   20 Units at 01/09/18 2127  . insulin lispro (HUMALOG) KwikPen 0-5 Units  0-5 Units Subcutaneous QHS [name], MD   3 Units at  01/09/18 2126  . insulin lispro (HUMALOG) KwikPen 0-9 Units  0-9 Units Subcutaneous TID WC [name], MD   3 Units at 01/09/18 1740  . magnesium citrate solution 1 Bottle  1 Bottle Oral Once PRN [name], MD      . magnesium oxide (MAG-OX) tablet 400 mg  400 mg Oral Daily [name], MD   400 mg at 01/09/18 0910  . MEDLINE mouth rinse  15 mL Mouth Rinse q12n4p [name], MD   15 mL at 01/05/18 1148  . methimazole (TAPAZOLE) tablet 10 mg  10 mg Oral Daily [name], MD   10 mg at 01/09/18 0910  . metoprolol succinate (TOPROL-XL) 24  hr tablet 50 mg  50 mg Oral BID [name], MD   50 mg at 01/09/18 2127  . metoprolol tartrate (LOPRESSOR) injection 5 mg  5 mg Intravenous 5 X Daily PRN Dhungel, Nishant, MD   5 mg at 01/06/18 1856  . ondansetron (ZOFRAN) tablet 4 mg  4 mg Oral Q6H PRN [name], MD       Or  . ondansetron [facility]) injection 4 mg  4 mg Intravenous Q6H PRN [name], MD   4 mg at 01/03/18 0309  . pantoprazole (PROTONIX) EC tablet 40 mg  40 mg Oral Daily [name], MD   40 mg at 01/09/18 0910  . polyethylene glycol (MIRALAX / GLYCOLAX) packet 17 g  17 g Oral BID Regalado, Belkys A, MD   17 g at 01/07/18 2300  . potassium chloride SA (K-DUR,KLOR-CON) CR tablet 20 mEq  20 mEq Oral BID [name], MD   20 mEq at 01/09/18 2127  . sodium chloride flush (NS) 0.9 % injection 3 mL  3 mL Intravenous Q12H [name], MD   3 mL at 01/09/18 0922  . sodium chloride flush (NS) 0.9 % injection 3 mL  3 mL Intravenous PRN [name], MD      . sotalol (BETAPACE) tablet 120 mg  120 mg Oral Q12H [name], MD   120 mg at 01/09/18 2127  . traMADol (ULTRAM) tablet 50 mg  50 mg Oral Q6H PRN [name], MD   50 mg at 01/07/18 1125  . vancomycin (VANCOCIN) IVPB 750 mg/150 ml premix  750 mg Intravenous Q12H Dhungel, Nishant, MD 150 mL/hr at 01/10/18 0748       Discharge Medications: Please see discharge summary for a list of discharge  medications.  Relevant Imaging Results:  Relevant Lab Results:   Additional Information SSN: [id number]  Estanislado Emms, LCSW

## 2018-01-10 NOTE — Plan of Care (Signed)
  Problem: Health Behavior/Discharge Planning: Goal: Ability to manage health-related needs will improve Outcome: Progressing   Problem: Activity: Goal: Risk for activity intolerance will decrease Outcome: Progressing   Problem: Elimination: Goal: Will not experience complications related to bowel motility Outcome: Progressing Goal: Will not experience complications related to urinary retention Outcome: Progressing   

## 2018-01-11 DIAGNOSIS — M79662 Pain in left lower leg: Secondary | ICD-10-CM | POA: Diagnosis not present

## 2018-01-11 DIAGNOSIS — H35372 Puckering of macula, left eye: Secondary | ICD-10-CM | POA: Diagnosis not present

## 2018-01-11 DIAGNOSIS — E113393 Type 2 diabetes mellitus with moderate nonproliferative diabetic retinopathy without macular edema, bilateral: Secondary | ICD-10-CM | POA: Diagnosis not present

## 2018-01-11 DIAGNOSIS — M25562 Pain in left knee: Secondary | ICD-10-CM | POA: Diagnosis not present

## 2018-01-11 DIAGNOSIS — R5081 Fever presenting with conditions classified elsewhere: Secondary | ICD-10-CM | POA: Diagnosis not present

## 2018-01-11 DIAGNOSIS — N183 Chronic kidney disease, stage 3 (moderate): Secondary | ICD-10-CM | POA: Diagnosis not present

## 2018-01-11 DIAGNOSIS — Z743 Need for continuous supervision: Secondary | ICD-10-CM | POA: Diagnosis not present

## 2018-01-11 DIAGNOSIS — S86911A Strain of unspecified muscle(s) and tendon(s) at lower leg level, right leg, initial encounter: Secondary | ICD-10-CM | POA: Diagnosis not present

## 2018-01-11 DIAGNOSIS — B379 Candidiasis, unspecified: Secondary | ICD-10-CM | POA: Diagnosis not present

## 2018-01-11 DIAGNOSIS — G4733 Obstructive sleep apnea (adult) (pediatric): Secondary | ICD-10-CM | POA: Diagnosis not present

## 2018-01-11 DIAGNOSIS — I2510 Atherosclerotic heart disease of native coronary artery without angina pectoris: Secondary | ICD-10-CM | POA: Diagnosis not present

## 2018-01-11 DIAGNOSIS — I509 Heart failure, unspecified: Secondary | ICD-10-CM | POA: Diagnosis not present

## 2018-01-11 DIAGNOSIS — M62838 Other muscle spasm: Secondary | ICD-10-CM | POA: Diagnosis not present

## 2018-01-11 DIAGNOSIS — W19XXXA Unspecified fall, initial encounter: Secondary | ICD-10-CM | POA: Diagnosis not present

## 2018-01-11 DIAGNOSIS — R630 Anorexia: Secondary | ICD-10-CM | POA: Diagnosis not present

## 2018-01-11 DIAGNOSIS — E1121 Type 2 diabetes mellitus with diabetic nephropathy: Secondary | ICD-10-CM | POA: Diagnosis not present

## 2018-01-11 DIAGNOSIS — M79652 Pain in left thigh: Secondary | ICD-10-CM | POA: Diagnosis not present

## 2018-01-11 DIAGNOSIS — L03116 Cellulitis of left lower limb: Secondary | ICD-10-CM | POA: Diagnosis not present

## 2018-01-11 DIAGNOSIS — R4181 Age-related cognitive decline: Secondary | ICD-10-CM | POA: Diagnosis not present

## 2018-01-11 DIAGNOSIS — M25561 Pain in right knee: Secondary | ICD-10-CM | POA: Diagnosis not present

## 2018-01-11 DIAGNOSIS — A419 Sepsis, unspecified organism: Secondary | ICD-10-CM | POA: Diagnosis not present

## 2018-01-11 DIAGNOSIS — K5900 Constipation, unspecified: Secondary | ICD-10-CM | POA: Diagnosis not present

## 2018-01-11 DIAGNOSIS — M6281 Muscle weakness (generalized): Secondary | ICD-10-CM | POA: Diagnosis not present

## 2018-01-11 DIAGNOSIS — I482 Chronic atrial fibrillation: Secondary | ICD-10-CM | POA: Diagnosis not present

## 2018-01-11 DIAGNOSIS — I5032 Chronic diastolic (congestive) heart failure: Secondary | ICD-10-CM | POA: Diagnosis not present

## 2018-01-11 DIAGNOSIS — M1712 Unilateral primary osteoarthritis, left knee: Secondary | ICD-10-CM | POA: Diagnosis not present

## 2018-01-11 DIAGNOSIS — R279 Unspecified lack of coordination: Secondary | ICD-10-CM | POA: Diagnosis not present

## 2018-01-11 DIAGNOSIS — Z9989 Dependence on other enabling machines and devices: Secondary | ICD-10-CM | POA: Diagnosis not present

## 2018-01-11 DIAGNOSIS — R52 Pain, unspecified: Secondary | ICD-10-CM | POA: Diagnosis not present

## 2018-01-11 DIAGNOSIS — R3981 Functional urinary incontinence: Secondary | ICD-10-CM | POA: Diagnosis not present

## 2018-01-11 DIAGNOSIS — J189 Pneumonia, unspecified organism: Secondary | ICD-10-CM | POA: Diagnosis not present

## 2018-01-11 DIAGNOSIS — J449 Chronic obstructive pulmonary disease, unspecified: Secondary | ICD-10-CM | POA: Diagnosis not present

## 2018-01-11 DIAGNOSIS — I10 Essential (primary) hypertension: Secondary | ICD-10-CM | POA: Diagnosis not present

## 2018-01-11 DIAGNOSIS — J9601 Acute respiratory failure with hypoxia: Secondary | ICD-10-CM | POA: Diagnosis not present

## 2018-01-11 DIAGNOSIS — M79669 Pain in unspecified lower leg: Secondary | ICD-10-CM | POA: Diagnosis not present

## 2018-01-11 LAB — GLUCOSE, CAPILLARY
Glucose-Capillary: 122 mg/dL — ABNORMAL HIGH (ref 70–99)
Glucose-Capillary: 182 mg/dL — ABNORMAL HIGH (ref 70–99)

## 2018-01-11 MED ORDER — FUROSEMIDE 40 MG PO TABS
60.0000 mg | ORAL_TABLET | Freq: Every day | ORAL | Status: DC
  Filled 2018-01-11: qty 1

## 2018-01-11 MED ORDER — FUROSEMIDE 20 MG PO TABS: 60.0000 mg | ORAL_TABLET | Freq: Every day | ORAL | Status: DC

## 2018-01-11 MED ORDER — POTASSIUM CHLORIDE CRYS ER 20 MEQ PO TBCR
20.0000 meq | EXTENDED_RELEASE_TABLET | Freq: Every day | ORAL | Status: DC
  Administered 2018-01-11: 20 meq via ORAL
  Filled 2018-01-11: qty 1

## 2018-01-11 MED ORDER — POTASSIUM CHLORIDE CRYS ER 20 MEQ PO TBCR: 20.0000 meq | EXTENDED_RELEASE_TABLET | Freq: Every day | ORAL | Status: DC

## 2018-01-11 MED ORDER — DOXYCYCLINE HYCLATE 100 MG PO TABS: 100.0000 mg | ORAL_TABLET | Freq: Two times a day (BID) | ORAL | 0 refills | Status: DC

## 2018-01-11 MED ORDER — SENNA 8.6 MG PO TABS: 1.0000 | ORAL_TABLET | Freq: Every day | ORAL | 0 refills | Status: DC

## 2018-01-11 MED ORDER — HYDROCODONE-ACETAMINOPHEN 5-325 MG PO TABS: 1.0000 | ORAL_TABLET | ORAL | 0 refills | Status: DC | PRN

## 2018-01-11 NOTE — Plan of Care (Signed)
  Problem: Clinical Measurements: Goal: Respiratory complications will improve Outcome: Progressing   Problem: Coping: Goal: Level of anxiety will decrease Outcome: Progressing   

## 2018-01-11 NOTE — Care Management Note (Signed)
Case Management Note  Patient Details  Name: Judith [name] MRN: [id number] Date of Birth: 1950-11-18  Subjective/Objective:                 Admitted w cellulitis   Action/Plan:  CSW following for DC to [facility] SN[facility].   Expected Discharge Date:  01/10/18               Expected Discharge Plan:  Skilled Nursing Facility  In-House Referral:  Clinical Social Work  Discharge planning Services     Post Acute Care Choice:    Choice offered to:     DME Arranged:    DME Agency:     HH Arranged:    [place] Agency:     Status of Service:  Completed, signed off  If discussed at [employer], dates discussed:    Additional Comments:  Carles Collet, RN 01/11/2018, 10:46 AM

## 2018-01-11 NOTE — Social Work (Addendum)
Clinical Social Worker facilitated patient discharge including contacting patient family and facility to confirm patient discharge plans.  Clinical information faxed to facility and family agreeable with plan.  CSW arranged ambulance transport via [facility] to [facility]. RN to call [phone] with report prior to discharge.  Clinical Social Worker will sign off for now as social work intervention is no longer needed. Please consult us again if new need arises.  Judith [name], LCSWA Clinical Social Worker [phone] 

## 2018-01-11 NOTE — Clinical Social Work Placement (Signed)
   CLINICAL SOCIAL WORK PLACEMENT  NOTE [place] RN to call report to [phone]  Date:  01/11/2018  Patient Details  Name: Judith [name] MRN: [id number] Date of Birth: 13-Feb-1951  Clinical Social Work is seeking post-discharge placement for this patient at the [place] level of care (*CSW will initial, date and re-position this form in  chart as items are completed):  Yes   Patient/family provided with [place] Work Department's list of facilities offering this level of care within the geographic area requested by the patient (or if unable, by the patient's family).  Yes   Patient/family informed of their freedom to choose among providers that offer the needed level of care, that participate in Medicare, Medicaid or managed care program needed by the patient, have an available bed and are willing to accept the patient.  Yes   Patient/family informed of [facility]'s ownership interest in [facility] and [facility], as well as of the fact that they are under no obligation to receive care at these facilities.  PASRR submitted to EDS on       PASRR number received on       Existing PASRR number confirmed on 01/10/18     FL2 transmitted to all facilities in geographic area requested by pt/family on 01/10/18     FL2 transmitted to all facilities within larger geographic area on       Patient informed that his/her managed care company has contracts with or will negotiate with certain facilities, including the following:        Yes   Patient/family informed of bed offers received.  Patient chooses bed at [facility]     Physician recommends and patient chooses bed at      Patient to be transferred to [facility] on 01/11/18.  Patient to be transferred to facility by [facility]     Patient family notified on 01/11/18 of transfer.  Name of family member notified:  sister, Tye [place]     PHYSICIAN       Additional Comment:     _______________________________________________ [place], [place] 01/11/2018, 12:03 PM

## 2018-01-11 NOTE — Progress Notes (Signed)
Report called to [place] at [facility].

## 2018-01-11 NOTE — Discharge Summary (Signed)
[facility]  Physician Discharge Summary   Patient ID: Judith [name] MRN: [id number] DOB/AGE: 65-Feb-1952 65 y.o.  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 Follow up with PCP in 1-2 weeks 2. Check basic metabolic panel on September 5 and then weekly 3. Needs oral antibiotics for 4 weeks.  4. Follow up with Dr Sharol Given in 1 week.   DISCHARGE CONDITION: fair  Diet recommendation: Modified carbohydrate  Filed Weights   01/02/18 1727 01/05/18 1557 01/06/18 1438  Weight: 81.1 kg 80.1 kg 80.1 kg    INITIAL HISTORY: 67 year old obese female with uncontrolled diabetes mellitus on insulin with diabetic polyneuropathy, chronic kidney disease stage III, hypertension, paroxysmal A. fib on Eliquis status post multiple DCCV in the past, hypertension, hyperlipidemia, CAD with history of PCI to DES, GERD, history of goiter, right foot amputation, OSA on BiPAP who presented to [facility] hospital with fever and redness with increased swelling of the left foot that started draining clear liquid. Patient was placed on empiric IV antibiotics and orthopedics consulted. Hospital stay complicated with development of acute respiratory failure with hypoxia secondary to pulmonary edema requiring high flow nasal cannula and BiPAP. Patient transferred to [facility] for I&D of the left foot abscess.   Consultations:  Dr. Sharol Given with orthopedics  Procedures:  Incision and drainage of the left lower extremity abscess   HOSPITAL COURSE:   Sepsis secondary to left leg cellulitis with abscess Patient remains stable.  She was  on vancomycin and cefepime.  Seen by orthopedic surgery.  She underwent incision and drainage.  Positive for Streptococcus mitis.  Antibiotic regimen discussed with orthopedics who recommended 4 weeks of doxycycline.    Acute respiratory failure with hypoxia/Acute diastolic CHF Associated with pulmonary edema requiring BiPAP and high flow nasal cannula.  Patient was given IV Lasix with improvement.  She will be discharged on oral Lasix.She has been weaned down to room air.    Constipation Patient was given bowel regimen.  X-ray was unremarkable.  She has had several bowel movements with accidents.     Type II diabetes mellitus, uncontrolled with nephropathy and polyneuropathy A1c of 8.7. Continue current diabetic regimen.  Monitor CBGs.  Chronic kidney disease stage III Renal function stable.  Coronary artery disease Asymptomatic. Continue statin.  Permanent A. fib Rate controlled on the monitor. Continue sotalol and Eliquis.   Essential hypertension Stable.  Continue amlodipine.  Normocytic Anemia Iron low, B 12 454. Startediron supplement.  No evidence of overt bleeding.  Hypomagnesemia Replenished  Overall stable.  Okay for discharge to skilled nursing facility.    PERTINENT LABS:  The results of significant diagnostics from this hospitalization (including imaging, microbiology, ancillary and laboratory) are listed below for reference.    Microbiology: Recent Results (from the past 240 hour(s))  Culture, blood (routine x 2)     Status: None   Collection Time: 01/02/18  2:13 PM  Result Value Ref Range Status   Specimen Description   Final    BLOOD RIGHT WRIST Performed at [facility] [address] [place], [place] [address]    Special Requests   Final    BOTTLES DRAWN AEROBIC AND ANAEROBIC Blood Culture adequate volume Performed at  [facility], [place] [address], [place], [place] [address]    Culture   Final      NO GROWTH 5 DAYS Performed at [facility], [place] [address], [place], [place] [address]    Report Status 01/07/2018 FINAL  Final  Culture, blood (routine x 2)     Status: None   Collection Time: 01/02/18  2:15 PM  Result Value Ref Range Status   Specimen Description   Final    BLOOD LEFT HAND Performed at [facility] [address], [place], [place] [address]    Special Requests   Final    BOTTLES DRAWN AEROBIC AND ANAEROBIC Blood Culture adequate volume Performed at [facility] [address] [place], [place] [address]    Culture   Final    NO GROWTH 5 DAYS Performed at [facility], [place] [address] [place], [place] [address]    Report Status 01/07/2018 FINAL  Final  MRSA PCR Screening     Status: None   Collection Time: 01/05/18  2:14 PM  Result Value Ref Range Status   MRSA by PCR NEGATIVE NEGATIVE Final    Comment:        The GeneXpert MRSA Assay (FDA approved for NASAL specimens only), is one component of a comprehensive MRSA colonization surveillance program. It is not intended to diagnose MRSA infection nor to guide or monitor treatment for MRSA infections. Performed at [facility], [place] [address] [place], [place] [address]   Aerobic/Anaerobic Culture (surgical/deep wound)     Status: None (Preliminary result)   Collection Time: 01/06/18  4:13 PM  Result Value Ref Range Status   Specimen Description TISSUE LEFT FOOT ULCER  Final   Special Requests NONE  Final   Gram Stain   Final    MODERATE WBC PRESENT, PREDOMINANTLY PMN RARE GRAM POSITIVE COCCI    Culture   Final    RARE STREPTOCOCCUS MITIS/ORALIS NO ANAEROBES ISOLATED; CULTURE IN PROGRESS FOR 5 DAYS    Report Status PENDING  Incomplete   Organism ID, Bacteria STREPTOCOCCUS MITIS/ORALIS  Final      Susceptibility   Streptococcus mitis/oralis - MIC*    TETRACYCLINE 0.5 SENSITIVE Sensitive     VANCOMYCIN 0.5 SENSITIVE Sensitive      CLINDAMYCIN Value in next row Sensitive      <=0.25 SENSITIVEPerformed at [facility] [address] [place], [place] [address]    * RARE STREPTOCOCCUS MITIS/ORALIS     Labs: Basic Metabolic Panel: Recent Labs  Lab 01/05/18 0321 01/07/18 0452 01/08/18 0548 01/09/18 0452  NA 137 131* 133* 136  K 3.6 4.3 4.6 4.9  CL 99 93* 95* 97*  CO2 '30 30 29 31  ' GLUCOSE 122* 223* 196* 138*  BUN '22 23 23 21  ' CREATININE 1.06* 1.25* 1.18* 1.25*  1.27*  CALCIUM 9.1 9.3 9.2 9.3  MG 1.6*  --   --   --   PHOS 2.3*  --   --   --    Liver Function Tests: Recent Labs  Lab 01/05/18 0321  ALBUMIN 2.4*   CBC: Recent Labs  Lab 01/05/18 0321 01/07/18 0452 01/08/18 0548 01/09/18 0452  WBC 14.5* 12.9* 12.1* 10.0  NEUTROABS 11.8*  --   --   --   HGB 8.4* 9.3* 9.1* 8.8*  HCT 25.9* 29.9* 29.5* 29.1*  MCV 84.6 85.9 86.8 87.9  PLT 544* 596* 614* 620*    CBG: Recent  Labs  Lab 01/10/18 0823 01/10/18 1241 01/10/18 1750 01/10/18 2042 01/11/18 0829  GLUCAP 99 178* 225* 229* 122*     IMAGING STUDIES Dg Abd 1 View  Result Date: 01/07/2018 CLINICAL DATA:  67 year old female with an 8 day history of constipation EXAM: ABDOMEN - 1 VIEW COMPARISON:  CT scan of the abdomen and pelvis 08/07/2017 FINDINGS: The bowel gas pattern is not obstructed. The colonic stool burden is unremarkable. The stomach is mildly distended with gas. No acute osseous abnormality. Small amount of high density material is noted within a diverticulum along the medial margin of the descending colon. IMPRESSION: Negative. Electronically Signed   By: Jacqulynn Cadet M.D.   On: 01/07/2018 15:41   Mr Foot Left Wo Contrast  Result Date: 01/03/2018 CLINICAL DATA:  Left foot pain and swelling in a diabetic patient. Failure to respond to antibiotic treatment. EXAM: MRI OF THE LEFT FOOT WITHOUT CONTRAST TECHNIQUE: Multiplanar, multisequence MR imaging of the left foot was performed. No intravenous contrast was  administered. COMPARISON:  Plain films left foot 01/03/2018. FINDINGS: Bones/Joint/Cartilage Bone marrow signal is normal without evidence of osteomyelitis, fracture or other focal lesion. Ligaments Intact. Muscles and Tendons Intermediate increased T2 signal and mild fatty atrophy of intrinsic musculature of the foot are consistent with diabetic myopathy. Soft tissues Subcutaneous edema is present over the dorsum of the foot with scattered locules of gas present, most notable over the third MTP joint. Subcutaneous edema about the toes is worst in the third toe. No notable joint effusion is identified. IMPRESSION: Extensive subcutaneous soft tissue edema in the dorsum of the foot with locules soft tissue gas as seen on plain films consistent with cellulitis. Negative for osteomyelitis or septic joint. Subcutaneous edema about the toes is worst in the third toe but no abscess is identified on this noncontrast exam. Electronically Signed   By: Inge Rise M.D.   On: 01/03/2018 10:21   Dg Chest Port 1 View  Result Date: 01/05/2018 CLINICAL DATA:  Pneumonia EXAM: PORTABLE CHEST 1 VIEW COMPARISON:  01/04/2018 FINDINGS: Cardiac shadow is enlarged but stable. The lungs are well aerated bilaterally. No focal infiltrate or sizable effusion is seen. No bony abnormality is noted. IMPRESSION: No acute abnormality seen. Electronically Signed   By: Inez Catalina M.D.   On: 01/05/2018 10:47   Dg Chest Port 1 View  Result Date: 01/04/2018 CLINICAL DATA:  Shortness of Breath EXAM: PORTABLE CHEST 1 VIEW COMPARISON:  01/03/2018 FINDINGS: Cardiac shadow is within normal limits. The lungs are well aerated bilaterally. The degree of vascular congestion has improved in the interval. No sizable effusion or infiltrate is seen. Old rib fractures are noted on the right. IMPRESSION: No acute abnormality noted. Electronically Signed   By: Inez Catalina M.D.   On: 01/04/2018 08:20   Dg Chest Port 1 View  Result Date:  01/03/2018 CLINICAL DATA:  Acute respiratory failure, hypoxia EXAM: PORTABLE CHEST 1 VIEW COMPARISON:  01/03/2018 FINDINGS: Cardiomegaly with vascular congestion. Mild interstitial prominence throughout the lungs could reflect interstitial edema. No confluent opacities or effusions. IMPRESSION: Cardiomegaly with vascular congestion and interstitial prominence, likely interstitial edema. Electronically Signed   By: Rolm Baptise M.D.   On: 01/03/2018 19:34   Dg Chest Port 1 View  Result Date: 01/03/2018 CLINICAL DATA:  Chest pain EXAM: PORTABLE CHEST 1 VIEW COMPARISON:  11/04/2017 FINDINGS: Interstitial coarsening with Kerley lines. Chronic cardiomegaly. No air bronchogram, effusion, or pneumothorax. IMPRESSION: CHF pattern. Electronically Signed   By: Monte Fantasia  M.D.   On: 01/03/2018 09:49   Dg Foot Complete Left  Result Date: 01/02/2018 CLINICAL DATA:  Cellulitis. On antibiotics for 2 days, worsening symptoms. EXAM: LEFT FOOT - COMPLETE 3+ VIEW COMPARISON:  None. FINDINGS: No acute fracture deformity or dislocation. No destructive bony lesions. Moderate plantar calcaneal spur. Calcification at Achilles insertion seen with old injury. Dorsal foot soft tissue swelling with forefoot dorsal subcutaneous gas. IMPRESSION: 1. Soft tissue swelling with dorsal forefoot subcutaneous gas concerning for necrotizing fasciitis. 2. No acute osseous process. Acute findings discussed with and reconfirmed by PA.MICHAEL MACZIS on 01/02/2018 at 1:49 pm. Electronically Signed   By: Elon Alas M.D.   On: 01/02/2018 13:49   Dg Foot Complete Right  Result Date: 12/31/2017 CLINICAL DATA:  foot sweling, eval for subq air, bone destruction, FB. Diabetic EXAM: RIGHT FOOT COMPLETE - 3+ VIEW COMPARISON:  None. FINDINGS: Osseous alignment is normal. Bone mineralization is normal. No fracture line or displaced fracture fragment. No acute or suspicious osseous lesion. No destructive change to suggest osteomyelitis. No  significant degenerative change. No soft tissue gas. No foreign body appreciated within the soft tissues. Incidental note made of chronic osseous spurring at the plantar margin of the posterior calcaneus and linear calcifications at the Achilles tendon insertion site suggesting chronic calcific tendinopathy. IMPRESSION: No osseous abnormality.  No soft tissue gas.  No foreign body seen. Electronically Signed   By: Franki Cabot M.D.   On: 12/31/2017 16:38    DISCHARGE EXAMINATION: Vitals:   01/10/18 1332 01/10/18 2041 01/10/18 2133 01/11/18 0434  BP: (!) 136/47 (!) 125/48 (!) 131/57 (!) 145/67  Pulse: (!) 58 (!) 59 65 (!) 57  Resp: '20 16  16  ' Temp: (!) 97.4 F (36.3 C) 98.9 F (37.2 C)  97.8 F (36.6 C)  TempSrc: Oral Oral  Oral  SpO2: 100% 99%  96%  Weight:      Height:       General appearance: alert, cooperative, appears stated age and no distress Resp: Normal effort at rest.  Diminished air entry at the bases.  No definite crackles or wheezing. Cardio: regular rate and rhythm, S1, S2 normal, no murmur, click, rub or gallop GI: soft, non-tender; bowel sounds normal; no masses,  no organomegaly  DISPOSITION: Skilled nursing facility for short-term rehab  Discharge Instructions    Call MD for:  difficulty breathing, headache or visual disturbances   Complete by:  As directed    Call MD for:  extreme fatigue   Complete by:  As directed    Call MD for:  persistant dizziness or light-headedness   Complete by:  As directed    Call MD for:  persistant nausea and vomiting   Complete by:  As directed    Call MD for:  redness, tenderness, or signs of infection (pain, swelling, redness, odor or green/yellow discharge around incision site)   Complete by:  As directed    Call MD for:  temperature >100.4   Complete by:  As directed    Change dressing   Complete by:  As directed    Wash left foot with soap and water daily apply dry dressing daily.   Diet - low sodium heart healthy    Complete by:  As directed    Discharge instructions   Complete by:  As directed    Review discharge instructions on the discharge summary.  You were cared for by a hospitalist during your hospital stay. If you have any questions about your  discharge medications or the care you received while you were in the hospital after you are discharged, you can call the unit and asked to speak with the hospitalist on call if the hospitalist that took care of you is not available. Once you are discharged, your primary care physician will handle any further medical issues. Please note that NO REFILLS for any discharge medications will be authorized once you are discharged, as it is imperative that you return to your primary care physician (or establish a relationship with a primary care physician if you do not have one) for your aftercare needs so that they can reassess your need for medications and monitor your lab values. If you do not have a primary care physician, you can call [phone] for a physician referral.   Increase activity slowly   Complete by:  As directed    Touch down weight bearing   Complete by:  As directed    Laterality:  left   Extremity:  Lower        Allergies as of 01/11/2018      Reactions   Contrast Media [iodinated Diagnostic Agents] Hives   Spoke to patient, Iodine allergy is really IV contrast allergy.    Dilaudid [hydromorphone Hcl] Other (See Comments)   HEADACHE   Novolog [insulin Aspart] Shortness Of Breath   "breathing problems"   Codeine Nausea And Vomiting   HIGH DOSES-SEVERE VOMITING   Iodine Other (See Comments)   MUST HAVE BENADRYL PRIOR TO PROCEDURE AND RIGHT BEFORE TREATMENT TO COUNTERACT REACTION-BLISTERING REACTION DERMATOLOGICAL   Penicillins Itching, Rash, Other (See Comments)   Has patient had a PCN reaction causing immediate rash, facial/tongue/throat swelling, SOB or lightheadedness with hypotension: no Has patient had a PCN reaction causing severe rash  involving mucus membranes or skin necrosis: No Has patient had a PCN reaction that required hospitalization No Has patient had a PCN reaction occurring within the last 10 years: No If all of the above answers are "NO", then may proceed with Cephalosporin use. CHEST SIZED RASH AND ITCHING   Propofol Other (See Comments)   "Breathing problems - asthma attack" Can take with benadryl   Ace Inhibitors Cough   Demerol [meperidine] Nausea And Vomiting   Neosporin [neomycin-bacitracin Zn-polymyx] Itching, Rash   MAKES REACTIONS WORSE WHEN USING AS PROPHYLACTIC   Percocet [oxycodone-acetaminophen] Rash   Tape Itching, Rash      Medication List    STOP taking these medications   cephALEXin 500 MG capsule Commonly known as:  KEFLEX   insulin lispro 100 UNIT/ML KiwkPen Commonly known as:  HUMALOG   silver sulfADIAZINE 1 % cream Commonly known as:  SILVADENE   traMADol 50 MG tablet Commonly known as:  ULTRAM     TAKE these medications   albuterol 108 (90 Base) MCG/ACT inhaler Commonly known as:  PROVENTIL HFA;VENTOLIN HFA Inhale 1-2 puffs into the lungs every 6 (six) hours as needed for wheezing or shortness of breath.   albuterol (2.5 MG/3ML) 0.083% nebulizer solution Commonly known as:  PROVENTIL Take 3 mLs (2.5 mg total) by nebulization every 6 (six) hours as needed for wheezing or shortness of breath.   amLODipine 5 MG tablet Commonly known as:  NORVASC Take 1 tablet (5 mg total) by mouth daily. What changed:    medication strength  how much to take   apixaban 5 MG Tabs tablet Commonly known as:  ELIQUIS Take 1 tablet (5 mg total) by mouth 2 (two) times daily.   atorvastatin 20  MG tablet Commonly known as:  LIPITOR Take 1 tablet (20 mg total) by mouth daily.   B-D SINGLE USE SWABS REGULAR Pads 1 each by Does not apply route 2 (two) times daily.   buPROPion 150 MG 12 hr tablet Commonly known as:  WELLBUTRIN SR Take 150 mg by mouth 2 (two) times daily. What  changed:  Another medication with the same name was removed. Continue taking this medication, and follow the directions you see here.   doxycycline 100 MG tablet Commonly known as:  VIBRA-TABS Take 1 tablet (100 mg total) by mouth every 12 (twelve) hours. For 4 weeks What changed:    when to take this  additional instructions   fenofibrate 145 MG tablet Commonly known as:  TRICOR Take 1 tablet (145 mg total) by mouth daily.   ferrous sulfate 325 (65 FE) MG tablet Take 1 tablet (325 mg total) by mouth 2 (two) times daily with a meal.   fluconazole 100 MG tablet Commonly known as:  DIFLUCAN Take 1 tablet (100 mg total) by mouth daily as needed (yeast infection).   FLUoxetine 20 MG capsule Commonly known as:  PROZAC Take 1 capsule (20 mg total) by mouth at bedtime.   furosemide 20 MG tablet Commonly known as:  LASIX Take 3 tablets (60 mg total) by mouth daily. Start taking on:  01/12/2018   gabapentin 300 MG capsule Commonly known as:  NEURONTIN Take 3 capsules (900 mg total) by mouth 2 (two) times daily. TAKE 3 CAPSULES BY MOUTH EVERY  MORNING AND 3 CAPSULES AT BEDTIME What changed:    how much to take  additional instructions   glucose blood test strip Used to check blood sugars 2x daily.   HYDROcodone-acetaminophen 5-325 MG tablet Commonly known as:  NORCO/VICODIN Take 1-2 tablets by mouth every 4 (four) hours as needed for moderate pain (pain score 4-6).   insulin degludec 100 UNIT/ML Sopn FlexTouch Pen Commonly known as:  TRESIBA Inject 0.2 mLs (20 Units total) into the skin daily at 10 pm.   Insulin Pen Needle 32G X 4 MM Misc Use to inject insulin daily   INSULIN SYRINGE .5CC/31GX5/16" 31G X 5/16" 0.5 ML Misc Use to inject insulin   magnesium oxide 400 (241.3 Mg) MG tablet Commonly known as:  MAG-OX Take 1 tablet (400 mg total) by mouth daily.   metFORMIN 500 MG 24 hr tablet Commonly known as:  GLUCOPHAGE-XR Take 4 tablets (2,000 mg total) by mouth  daily with breakfast. What changed:    how much to take  when to take this   methimazole 10 MG tablet Commonly known as:  TAPAZOLE Take 1 tablet (10 mg total) by mouth daily.   metoprolol succinate 25 MG 24 hr tablet Commonly known as:  TOPROL-XL Take 1 tablet (25 mg total) by mouth 2 (two) times daily. What changed:    when to take this  additional instructions   nitroGLYCERIN 0.4 MG SL tablet Commonly known as:  NITROSTAT Place 1 tablet (0.4 mg total) under the tongue every 5 (five) minutes as needed for chest pain.   pantoprazole 40 MG tablet Commonly known as:  PROTONIX Take 1 tablet (40 mg total) by mouth daily.   polyethylene glycol packet Commonly known as:  MIRALAX / GLYCOLAX Take 17 g by mouth daily as needed.   potassium chloride SA 20 MEQ tablet Commonly known as:  K-DUR,KLOR-CON Take 1 tablet (20 mEq total) by mouth daily. What changed:  when to take this  senna 8.6 MG Tabs tablet Commonly known as:  SENOKOT Take 1 tablet (8.6 mg total) by mouth at bedtime.   sotalol 120 MG tablet Commonly known as:  BETAPACE Take 1 tablet (120 mg total) by mouth every 12 (twelve) hours.   triamcinolone cream 0.1 % Commonly known as:  KENALOG Apply 1 application topically 2 (two) times daily. What changed:    when to take this  reasons to take this   TRUE METRIX AIR GLUCOSE METER w/Device Kit 1 each by Does not apply route daily.   TRUE METRIX LEVEL 1 Low Soln 1 each by In Vitro route as needed.   TRUEPLUS LANCETS 28G Misc 1 each by Does not apply route 2 (two) times daily.            Discharge Care Instructions  (From admission, onward)         Start     Ordered   01/10/18 0000  Change dressing    Comments:  Wash left foot with soap and water daily apply dry dressing daily.   01/10/18 0837   01/10/18 0000  Touch down weight bearing    Question Answer Comment  Laterality left   Extremity Lower      01/10/18 0837            Contact  information for follow-up providers    [name], MD In 1 week.   Specialty:  Orthopedic Surgery Contact information: [place] [address] [phone]            Contact information for after-discharge care    Destination    [facility] .   Service:  Skilled Nursing Contact information: [address] [place] [phone]                  TOTAL DISCHARGE TIME: 35 minutes  [facility] Pager [phone]  01/11/2018, 11:47 AM

## 2018-01-12 LAB — AEROBIC/ANAEROBIC CULTURE W GRAM STAIN (SURGICAL/DEEP WOUND)

## 2018-01-12 LAB — AEROBIC/ANAEROBIC CULTURE (SURGICAL/DEEP WOUND)

## 2018-01-13 DIAGNOSIS — I5032 Chronic diastolic (congestive) heart failure: Secondary | ICD-10-CM | POA: Diagnosis not present

## 2018-01-13 DIAGNOSIS — I2510 Atherosclerotic heart disease of native coronary artery without angina pectoris: Secondary | ICD-10-CM | POA: Diagnosis not present

## 2018-01-13 DIAGNOSIS — N183 Chronic kidney disease, stage 3 (moderate): Secondary | ICD-10-CM | POA: Diagnosis not present

## 2018-01-13 DIAGNOSIS — K5900 Constipation, unspecified: Secondary | ICD-10-CM | POA: Diagnosis not present

## 2018-01-13 DIAGNOSIS — J9601 Acute respiratory failure with hypoxia: Secondary | ICD-10-CM | POA: Diagnosis not present

## 2018-01-13 DIAGNOSIS — M6281 Muscle weakness (generalized): Secondary | ICD-10-CM | POA: Diagnosis not present

## 2018-01-13 DIAGNOSIS — B379 Candidiasis, unspecified: Secondary | ICD-10-CM | POA: Diagnosis not present

## 2018-01-13 DIAGNOSIS — A419 Sepsis, unspecified organism: Secondary | ICD-10-CM | POA: Diagnosis not present

## 2018-01-13 DIAGNOSIS — E1121 Type 2 diabetes mellitus with diabetic nephropathy: Secondary | ICD-10-CM | POA: Diagnosis not present

## 2018-01-16 DIAGNOSIS — M6281 Muscle weakness (generalized): Secondary | ICD-10-CM | POA: Diagnosis not present

## 2018-01-16 DIAGNOSIS — I5032 Chronic diastolic (congestive) heart failure: Secondary | ICD-10-CM | POA: Diagnosis not present

## 2018-01-16 DIAGNOSIS — I2510 Atherosclerotic heart disease of native coronary artery without angina pectoris: Secondary | ICD-10-CM | POA: Diagnosis not present

## 2018-01-16 DIAGNOSIS — K5900 Constipation, unspecified: Secondary | ICD-10-CM | POA: Diagnosis not present

## 2018-01-16 DIAGNOSIS — A419 Sepsis, unspecified organism: Secondary | ICD-10-CM | POA: Diagnosis not present

## 2018-01-16 DIAGNOSIS — J9601 Acute respiratory failure with hypoxia: Secondary | ICD-10-CM | POA: Diagnosis not present

## 2018-01-16 DIAGNOSIS — E1121 Type 2 diabetes mellitus with diabetic nephropathy: Secondary | ICD-10-CM | POA: Diagnosis not present

## 2018-01-16 DIAGNOSIS — N183 Chronic kidney disease, stage 3 (moderate): Secondary | ICD-10-CM | POA: Diagnosis not present

## 2018-01-16 DIAGNOSIS — B379 Candidiasis, unspecified: Secondary | ICD-10-CM | POA: Diagnosis not present

## 2018-01-17 DIAGNOSIS — W19XXXA Unspecified fall, initial encounter: Secondary | ICD-10-CM | POA: Diagnosis not present

## 2018-01-17 DIAGNOSIS — H35372 Puckering of macula, left eye: Secondary | ICD-10-CM | POA: Diagnosis not present

## 2018-01-17 DIAGNOSIS — N183 Chronic kidney disease, stage 3 (moderate): Secondary | ICD-10-CM | POA: Diagnosis not present

## 2018-01-17 DIAGNOSIS — K5900 Constipation, unspecified: Secondary | ICD-10-CM | POA: Diagnosis not present

## 2018-01-17 DIAGNOSIS — I2510 Atherosclerotic heart disease of native coronary artery without angina pectoris: Secondary | ICD-10-CM | POA: Diagnosis not present

## 2018-01-17 DIAGNOSIS — E113393 Type 2 diabetes mellitus with moderate nonproliferative diabetic retinopathy without macular edema, bilateral: Secondary | ICD-10-CM | POA: Diagnosis not present

## 2018-01-17 DIAGNOSIS — L03116 Cellulitis of left lower limb: Secondary | ICD-10-CM | POA: Diagnosis not present

## 2018-01-17 DIAGNOSIS — A419 Sepsis, unspecified organism: Secondary | ICD-10-CM | POA: Diagnosis not present

## 2018-01-17 DIAGNOSIS — J9601 Acute respiratory failure with hypoxia: Secondary | ICD-10-CM | POA: Diagnosis not present

## 2018-01-17 DIAGNOSIS — M6281 Muscle weakness (generalized): Secondary | ICD-10-CM | POA: Diagnosis not present

## 2018-01-17 DIAGNOSIS — I5032 Chronic diastolic (congestive) heart failure: Secondary | ICD-10-CM | POA: Diagnosis not present

## 2018-01-18 ENCOUNTER — Encounter (INDEPENDENT_AMBULATORY_CARE_PROVIDER_SITE_OTHER): Payer: Self-pay | Admitting: Physician Assistant

## 2018-01-18 ENCOUNTER — Ambulatory Visit (INDEPENDENT_AMBULATORY_CARE_PROVIDER_SITE_OTHER): Payer: Medicare PPO | Admitting: Physician Assistant

## 2018-01-18 VITALS — Ht 64.0 in | Wt 176.6 lb

## 2018-01-18 DIAGNOSIS — Z794 Long term (current) use of insulin: Secondary | ICD-10-CM

## 2018-01-18 DIAGNOSIS — B351 Tinea unguium: Secondary | ICD-10-CM

## 2018-01-18 DIAGNOSIS — L03116 Cellulitis of left lower limb: Secondary | ICD-10-CM

## 2018-01-18 DIAGNOSIS — E11628 Type 2 diabetes mellitus with other skin complications: Secondary | ICD-10-CM

## 2018-01-18 DIAGNOSIS — E1142 Type 2 diabetes mellitus with diabetic polyneuropathy: Secondary | ICD-10-CM

## 2018-01-19 ENCOUNTER — Ambulatory Visit: Payer: Medicare PPO | Admitting: Adult Health

## 2018-01-19 ENCOUNTER — Encounter (INDEPENDENT_AMBULATORY_CARE_PROVIDER_SITE_OTHER): Payer: Self-pay | Admitting: Physician Assistant

## 2018-01-19 ENCOUNTER — Other Ambulatory Visit: Payer: Self-pay

## 2018-01-19 ENCOUNTER — Encounter: Payer: Self-pay | Admitting: Adult Health

## 2018-01-19 VITALS — BP 109/63 | HR 61 | Ht 64.0 in | Wt 176.5 lb

## 2018-01-19 DIAGNOSIS — G4733 Obstructive sleep apnea (adult) (pediatric): Secondary | ICD-10-CM

## 2018-01-19 DIAGNOSIS — Z9989 Dependence on other enabling machines and devices: Secondary | ICD-10-CM

## 2018-01-19 NOTE — Progress Notes (Signed)
Order for CPAP sent to [facility])

## 2018-01-19 NOTE — Progress Notes (Signed)
Office Visit Note   Patient: Judith [name]           Date of Birth: 04-20-1951           MRN: [id number] Visit Date: 01/18/2018              Requested by: [name], PA-C [address] [place], [place] [address] PCP: Patient, No Pcp Per  Chief Complaint  Patient presents with  . Left Foot - Wound Check      HPI: Patient is a 67 year old female who was seen for postoperative follow-up following irrigation and debridement of the left foot abscess/cellulitis on 01/06/2018.  OR cultures were consistent with staph coagulase-negative and strep mitis which was pansensitive.  She continues on doxycycline and will continue on this for a total of 4 weeks.  She has been trying to maintain nonweightbearing status.  She is utilizing a wheelchair for most of her mobility.  We discussed today that she will continue to be nonweightbearing as much as possible over the left foot.  She is at [facility] skilled nursing facility at this point.  Assessment & Plan: Visit Diagnoses:  1. Cellulitis of left foot   2. Diabetic peripheral neuropathy ([place])   3. Onychomycosis   4. Type 2 diabetes mellitus with other skin complication, with long-term current use of insulin (HCC)     Plan: Continue on doxycycline 100 mg twice daily for total of 4 weeks.  Continue nonweightbearing on the left foot.  We we will leave her sutures in for another week and she will follow-up next week for suture removal.  Reinforced with the patient to elevate the extremity as much as possible.  Follow-Up Instructions: Return in about 1 week (around 01/25/2018).   Ortho Exam  Patient is alert, oriented, no adenopathy, well-dressed, normal affect, normal respiratory effort. Left foot incisions are intact over the dorsal and plantar surfaces.  There is scant serosanguineous drainage.  There is no signs of cellulitis.  She has mild edema.  There is no odor.  Imaging: No results found. No images are attached to the  encounter.  Labs: Lab Results  Component Value Date   HGBA1C 8.7 (A) 12/05/2017   HGBA1C 7.9 (H) 08/06/2017   HGBA1C 9.2 (H) 07/04/2017   ESRSEDRATE 133 (H) 01/02/2018   CRP 23.5 (H) 01/02/2018   REPTSTATUS 01/12/2018 FINAL 01/06/2018   GRAMSTAIN  01/06/2018    MODERATE WBC PRESENT, PREDOMINANTLY PMN RARE GRAM POSITIVE COCCI    CULT  01/06/2018    RARE STREPTOCOCCUS MITIS/ORALIS RARE STAPHYLOCOCCUS SPECIES (COAGULASE NEGATIVE) NO ANAEROBES ISOLATED Performed at [facility], [place] [address], [place], [place] [address]    LABORGA STREPTOCOCCUS MITIS/ORALIS 01/06/2018     Lab Results  Component Value Date   ALBUMIN 2.4 (L) 01/05/2018   ALBUMIN 4.2 09/19/2017   ALBUMIN 4.0 07/22/2017    Body mass index is 30.31 kg/m.  Orders:  No orders of the defined types were placed in this encounter.  No orders of the defined types were placed in this encounter.    Procedures: No procedures performed  Clinical Data: No additional findings.  ROS:  All other systems negative, except as noted in the HPI. Review of Systems  Objective: Vital Signs: Ht 5\' 4"  (1.626 m)   Wt 176 lb 9.4 oz (80.1 kg)   BMI 30.31 kg/m   Specialty Comments:  No specialty comments available.  PMFS History: Patient Active Problem List   Diagnosis Date Noted  . Cutaneous  abscess of left foot   . Acute respiratory failure with hypoxia ([place])   . Cellulitis 01/02/2018  . LGI bleed   . Acute blood loss anemia   . Wide-complex tachycardia ([place]) 07/04/2017  . Type II diabetes mellitus, uncontrolled ([place]) 07/04/2017  . Peripheral neuropathy 07/04/2017  . H/O hyperthyroidism 07/04/2017  . S/P transmetatarsal amputation of foot, right ([place]) 04/19/2016  . Obstructive sleep apnea 11/26/2015  . Bilateral carpal tunnel syndrome 11/26/2015  . Hypomagnesemia 11/16/2015  . Hyperlipidemia LDL goal <70 10/13/2015  . Abnormality of gait 09/02/2015  . Memory loss 08/12/2015  . Diabetic peripheral  neuropathy ([place]) 08/12/2015  . Vitamin D deficiency 08/12/2015  . Hyperthyroidism 04/30/2015  . Heme positive stool   . Persistent atrial fibrillation ([place]) 04/29/2015  . Coronary artery disease with stable angina pectoris ([place]) 03/29/2015  . Abnormal nuclear stress test   . History of goiter 09/28/2014  . GERD (gastroesophageal reflux disease) 07/31/2013  . Depression with anxiety 06/01/2013  . DM2 (diabetes mellitus, type 2) ([place]) 01/30/2013  . HTN (hypertension) 01/30/2013   Past Medical History:  Diagnosis Date  . Abnormal EKG 07/31/2013  . Arthritis    "hands" (03/06/2015)  . Asthma   . Carpal tunnel syndrome, bilateral   . Chronic kidney disease   . Complication of anesthesia    slow to wake up  . Coronary artery disease     2 v CAD with CTO of the RCA and high grade bifurcational LCx/OM stenosis. S/P PCI DES x 2 to the LCx/OM.  Marland Kitchen Diabetic peripheral neuropathy ([place]) "since 1996"  . GERD (gastroesophageal reflux disease)   . Goiter   . Headache    migraines prior to menopause  . History of shingles 06/01/2013  . Hyperlipidemia LDL goal <70 10/13/2015  . Hypertension   . Hyperthyroidism   . PAF (paroxysmal atrial fibrillation) ([place]) 04/29/2015   CHADS2VASC score of 5 now on Apixaban  . Pneumonia ~ 1976  . Tremors of nervous system   . Type II diabetes mellitus (HCC)    insulin dependent    Family History  Problem Relation Age of Onset  . Cancer Mother 90       bronchial cancer  . Breast cancer Mother   . Lung cancer Mother   . Hypertension Father   . COPD Father   . Heart disease Father 43       CAD with cardiac stenting  . Heart attack Father   . Parkinson's disease Father   . Allergies Sister   . Breast cancer Maternal Grandmother   . Emphysema Maternal Grandfather   . Leukemia Paternal Grandmother   . Emphysema Paternal Grandfather   . Thyroid disease Neg Hx     Past Surgical History:  Procedure Laterality Date  . ABDOMINAL HYSTERECTOMY  1988   age  75; CERVICAL DYSPLASIA; ovaries intact.   . AMPUTATION Right 01/23/2016   Procedure: Right 3rd Ray Amputation;  Surgeon: [name], MD;  Location: [facility];  Service: Orthopedics;  Laterality: Right;  . AMPUTATION Right 02/13/2016   Procedure: Right Transmetatarsal Amputation;  Surgeon: [name], MD;  Location: [facility];  Service: Orthopedics;  Laterality: Right;  . CARDIAC CATHETERIZATION N/A 02/27/2015   Procedure: Left Heart Cath and Coronary Angiography;  Surgeon: [name], MD; LAD 40%, mCFX 80%, OM 70%, RCA 100% calcified       . CARDIAC CATHETERIZATION N/A 03/06/2015   Procedure: Coronary Stent Intervention;  Surgeon: [name], MD;  Location: [facility]  INVASIVE CV LAB;  Service: Cardiovascular;  Laterality: N/A;  Mid CX 3.50x12 promus DES w/ 0% resdual and Prox OM1 2.50x20 promus DES w/ 20% residual  . CARDIOVERSION    . CARPAL TUNNEL RELEASE Right Nov 2015  . CARPAL TUNNEL RELEASE Right 1992; 05/2014   [name]; [place]  . CESAREAN SECTION  1982; 1984  . FOOT NEUROMA SURGERY Bilateral 2000  . I&D EXTREMITY Left 01/06/2018   Procedure: DEBRIDEMENT ULCER LEFT FOOT;  Surgeon: [name], MD;  Location: [facility];  Service: Orthopedics;  Laterality: Left;  . KNEE ARTHROSCOPY Right ~ 2003   "meniscus repair"  . SHOULDER OPEN ROTATOR CUFF REPAIR Right 1996; 1998   "w/fracture repair"  . THYROID SURGERY  2000   "removed lots of nodules"  . TONSILLECTOMY  1976   Social History   Occupational History  . Occupation: Landscape architect  Tobacco Use  . Smoking status: Former Smoker    Packs/day: 0.00    Years: 41.00    Pack years: 0.00    Types: Cigarettes    Last attempt to quit: 03/05/2015    Years since quitting: 2.8  . Smokeless tobacco: Never Used  . Tobacco comment: 04/29/2015 "quit smoking cigarettes 02/27/2015"  Substance and Sexual Activity  . Alcohol use: No  . Drug use: No  . Sexual activity: Not Currently    Birth control/protection: Post-menopausal, Surgical

## 2018-01-19 NOTE — Progress Notes (Signed)
PATIENT: Jandi Ostrom DOB: [birth year]  REASON FOR VISIT: follow up HISTORY FROM: patient  HISTORY OF PRESENT ILLNESS: Today 01/19/18:  Ms. Ibanez 67-year-old female with a history of obstructive sleep apnea on BiPAP.  She returns today for her first BiPAP download.  Her download indicates that she use machine 27 out of 30 days for compliance of 90%.  She greater than 4 hours 18 days for compliance of 60%.  She use her machine on average 5 hours and 5 minutes.  She is on pressure of 19/11 with pressure support of 4.  Her residual AHI is 4.3.  She has a significant leak in the 95th percentile at 42.9 L/min.  She states that she can feel the mask leaking at night.  She states that the straps do not fit well on her head.  Otherwise she has noticed a benefit of using a CPAP.  She returns today for evaluation.  HISTORY 09-20-2017, I have the pleasure to see Mrs. Battershell in a sleep consultation.  She had recently multiple hospitalizations and was told by the hospital physicians and nursing staff that she had repeated apnea during her sleep, oxygen desaturations while asleep.  She had recently seen her primary care who refilled nebulized albuterol, Tessalon capsules and gave her a short course of steroids a tricky enterprise given that she is a very brittle diabetic.  She stated such her endocrinologist finally figured out that her cough and bronchitis were not related to an infection but to the use of a generic insulin.  She cannot tolerate this insulin and had to go back to brand name.  Her diabetes has led to multiple consequences in September 2017 she had an amputation of the right middle toe, and an amputation of the right foot followed  in October 2017( Dr. Sharol Given ) . She reports excessive daytime sleepiness. She was told she snores, and has wheezing ( RN on telemetry floor).  Her diabetes  is now better controlled than in 2017/18 as she couldn't afford COBRA and waited for medicare to cover her. She  suffers form diarrhea , and she still has coughing. She wonders about sleep apnea.     REVIEW OF SYSTEMS: Out of a complete 14 system review of symptoms, the patient complains only of the following symptoms, and all other reviewed systems are negative.  See HPI  ALLERGIES: Allergies  Allergen Reactions  . Contrast Media [Iodinated Diagnostic Agents] Hives    Spoke to patient, Iodine allergy is really IV contrast allergy.   . Dilaudid [Hydromorphone Hcl] Other (See Comments)    HEADACHE  . Novolog [Insulin Aspart] Shortness Of Breath    "breathing problems"  . Codeine Nausea And Vomiting    HIGH DOSES-SEVERE VOMITING  . Iodine Other (See Comments)    MUST HAVE BENADRYL PRIOR TO PROCEDURE AND RIGHT BEFORE TREATMENT TO COUNTERACT REACTION-BLISTERING REACTION DERMATOLOGICAL  . Penicillins Itching, Rash and Other (See Comments)    Has patient had a PCN reaction causing immediate rash, facial/tongue/throat swelling, SOB or lightheadedness with hypotension: no Has patient had a PCN reaction causing severe rash involving mucus membranes or skin necrosis: No Has patient had a PCN reaction that required hospitalization No Has patient had a PCN reaction occurring within the last 10 years: No If all of the above answers are "NO", then may proceed with Cephalosporin use.  CHEST SIZED RASH AND ITCHING   . Propofol Other (See Comments)    "Breathing problems - asthma attack" Can take with  benadryl   . Ace Inhibitors Cough  . Demerol [Meperidine] Nausea And Vomiting  . Neosporin [Neomycin-Bacitracin Zn-Polymyx] Itching and Rash    MAKES REACTIONS WORSE WHEN USING AS PROPHYLACTIC  . Percocet [Oxycodone-Acetaminophen] Rash  . Tape Itching and Rash    HOME MEDICATIONS: Outpatient Medications Prior to Visit  Medication Sig Dispense Refill  . albuterol (PROVENTIL HFA) 108 (90 Base) MCG/ACT inhaler Inhale 1-2 puffs into the lungs every 6 (six) hours as needed for wheezing or shortness of  breath. 1 Inhaler 0  . albuterol (PROVENTIL) (2.5 MG/3ML) 0.083% nebulizer solution Take 3 mLs (2.5 mg total) by nebulization every 6 (six) hours as needed for wheezing or shortness of breath. 150 mL 0  . Alcohol Swabs (B-D SINGLE USE SWABS REGULAR) PADS 1 each by Does not apply route 2 (two) times daily. 100 each 2  . amLODipine (NORVASC) 5 MG tablet Take 1 tablet (5 mg total) by mouth daily. 30 tablet 0  . apixaban (ELIQUIS) 5 MG TABS tablet Take 1 tablet (5 mg total) by mouth 2 (two) times daily. 180 tablet 3  . atorvastatin (LIPITOR) 20 MG tablet Take 1 tablet (20 mg total) by mouth daily. 90 tablet 3  . Blood Glucose Calibration (TRUE METRIX LEVEL 1) Low SOLN 1 each by In Vitro route as needed. 1 each 0  . Blood Glucose Monitoring Suppl (TRUE METRIX AIR GLUCOSE METER) w/Device KIT 1 each by Does not apply route daily. 1 kit 0  . buPROPion (WELLBUTRIN SR) 150 MG 12 hr tablet Take 150 mg by mouth 2 (two) times daily.    Marland Kitchen doxycycline (VIBRA-TABS) 100 MG tablet Take 1 tablet (100 mg total) by mouth every 12 (twelve) hours. For 4 weeks 56 tablet 0  . fenofibrate (TRICOR) 145 MG tablet Take 1 tablet (145 mg total) by mouth daily. 90 tablet 3  . ferrous sulfate 325 (65 FE) MG tablet Take 1 tablet (325 mg total) by mouth 2 (two) times daily with a meal. 30 tablet 3  . fluconazole (DIFLUCAN) 100 MG tablet Take 1 tablet (100 mg total) by mouth daily as needed (yeast infection). 5 tablet 0  . FLUoxetine (PROZAC) 20 MG capsule Take 1 capsule (20 mg total) by mouth at bedtime. 90 capsule 2  . furosemide (LASIX) 20 MG tablet Take 3 tablets (60 mg total) by mouth daily. 30 tablet   . gabapentin (NEURONTIN) 300 MG capsule Take 3 capsules (900 mg total) by mouth 2 (two) times daily. TAKE 3 CAPSULES BY MOUTH EVERY  MORNING AND 3 CAPSULES AT BEDTIME (Patient taking differently: Take 600 mg by mouth 2 (two) times daily. ) 540 capsule 0  . glucose blood (TRUE METRIX BLOOD GLUCOSE TEST) test strip Used to check  blood sugars 2x daily. 100 each 12  . HYDROcodone-acetaminophen (NORCO/VICODIN) 5-325 MG tablet Take 1-2 tablets by mouth every 4 (four) hours as needed for moderate pain (pain score 4-6). 30 tablet 0  . insulin degludec (TRESIBA FLEXTOUCH) 100 UNIT/ML SOPN FlexTouch Pen Inject 0.2 mLs (20 Units total) into the skin daily at 10 pm. 15 mL 0  . Insulin Pen Needle 32G X 4 MM MISC Use to inject insulin daily 100 each 5  . Insulin Syringe-Needle U-100 (INSULIN SYRINGE .5CC/31GX5/16") 31G X 5/16" 0.5 ML MISC Use to inject insulin 100 each 5  . magnesium oxide (MAG-OX) 400 (241.3 Mg) MG tablet Take 1 tablet (400 mg total) by mouth daily. 30 tablet 6  . metFORMIN (GLUCOPHAGE-XR) 500 MG 24 hr  tablet Take 4 tablets (2,000 mg total) by mouth daily with breakfast. (Patient taking differently: Take 1,000 mg by mouth 2 (two) times daily. ) 360 tablet 3  . methimazole (TAPAZOLE) 10 MG tablet Take 1 tablet (10 mg total) by mouth daily. 30 tablet 11  . metoprolol succinate (TOPROL-XL) 25 MG 24 hr tablet Take 1 tablet (25 mg total) by mouth 2 (two) times daily. (Patient taking differently: Take 25 mg by mouth See admin instructions. Takes 1 tablet twice daily. Then 1 tablet daily as needed for a.fib) 180 tablet 3  . nitroGLYCERIN (NITROSTAT) 0.4 MG SL tablet Place 1 tablet (0.4 mg total) under the tongue every 5 (five) minutes as needed for chest pain. 25 tablet 3  . pantoprazole (PROTONIX) 40 MG tablet Take 1 tablet (40 mg total) by mouth daily. 90 tablet 0  . polyethylene glycol (MIRALAX / GLYCOLAX) packet Take 17 g by mouth daily as needed. 14 each 0  . potassium chloride SA (K-DUR,KLOR-CON) 20 MEQ tablet Take 1 tablet (20 mEq total) by mouth daily.    Marland Kitchen senna (SENOKOT) 8.6 MG TABS tablet Take 1 tablet (8.6 mg total) by mouth at bedtime. 120 each 0  . sotalol (BETAPACE) 120 MG tablet Take 1 tablet (120 mg total) by mouth every 12 (twelve) hours. 180 tablet 3  . triamcinolone cream (KENALOG) 0.1 % Apply 1 application  topically 2 (two) times daily. (Patient taking differently: Apply 1 application topically 2 (two) times daily as needed (for rash). ) 30 g 0  . TRUEPLUS LANCETS 28G MISC 1 each by Does not apply route 2 (two) times daily. 100 each 2   No facility-administered medications prior to visit.     PAST MEDICAL HISTORY: Past Medical History:  Diagnosis Date  . Abnormal EKG 07/31/2013  . Arthritis    "hands" (03/06/2015)  . Asthma   . Carpal tunnel syndrome, bilateral   . Chronic kidney disease   . Complication of anesthesia    slow to wake up  . Coronary artery disease     2 v CAD with CTO of the RCA and high grade bifurcational LCx/OM stenosis. S/P PCI DES x 2 to the LCx/OM.  Marland Kitchen Diabetic peripheral neuropathy ([place]) "since 1996"  . GERD (gastroesophageal reflux disease)   . Goiter   . Headache    migraines prior to menopause  . History of shingles 06/01/2013  . Hyperlipidemia LDL goal <70 10/13/2015  . Hypertension   . Hyperthyroidism   . PAF (paroxysmal atrial fibrillation) ([place]) 04/29/2015   CHADS2VASC score of 5 now on Apixaban  . Pneumonia ~ 1976  . Tremors of nervous system   . Type II diabetes mellitus (HCC)    insulin dependent    PAST SURGICAL HISTORY: Past Surgical History:  Procedure Laterality Date  . ABDOMINAL HYSTERECTOMY  1988   age 32; CERVICAL DYSPLASIA; ovaries intact.   . AMPUTATION Right 01/23/2016   Procedure: Right 3rd Ray Amputation;  Surgeon: [name], MD;  Location: [facility];  Service: Orthopedics;  Laterality: Right;  . AMPUTATION Right 02/13/2016   Procedure: Right Transmetatarsal Amputation;  Surgeon: [name], MD;  Location: [facility];  Service: Orthopedics;  Laterality: Right;  . CARDIAC CATHETERIZATION N/A 02/27/2015   Procedure: Left Heart Cath and Coronary Angiography;  Surgeon: [name], MD; LAD 40%, mCFX 80%, OM 70%, RCA 100% calcified       . CARDIAC CATHETERIZATION N/A 03/06/2015   Procedure: Coronary Stent Intervention;  Surgeon: [name],  MD;  Location: [facility];  Service: Cardiovascular;  Laterality: N/A;  Mid CX 3.50x12 promus DES w/ 0% resdual and Prox OM1 2.50x20 promus DES w/ 20% residual  . CARDIOVERSION    . CARPAL TUNNEL RELEASE Right Nov 2015  . CARPAL TUNNEL RELEASE Right 1992; 05/2014   [name]; [place]  . CESAREAN SECTION  1982; 1984  . FOOT NEUROMA SURGERY Bilateral 2000  . I&D EXTREMITY Left 01/06/2018   Procedure: DEBRIDEMENT ULCER LEFT FOOT;  Surgeon: [name], MD;  Location: [facility];  Service: Orthopedics;  Laterality: Left;  . KNEE ARTHROSCOPY Right ~ 2003   "meniscus repair"  . SHOULDER OPEN ROTATOR CUFF REPAIR Right 1996; 1998   "w/fracture repair"  . THYROID SURGERY  2000   "removed lots of nodules"  . TONSILLECTOMY  1976    FAMILY HISTORY: Family History  Problem Relation Age of Onset  . Cancer Mother 11       bronchial cancer  . Breast cancer Mother   . Lung cancer Mother   . Hypertension Father   . COPD Father   . Heart disease Father 37       CAD with cardiac stenting  . Heart attack Father   . Parkinson's disease Father   . Allergies Sister   . Breast cancer Maternal Grandmother   . Emphysema Maternal Grandfather   . Leukemia Paternal Grandmother   . Emphysema Paternal Grandfather   . Thyroid disease Neg Hx     SOCIAL HISTORY: Social History   Socioeconomic History  . Marital status: Single    Spouse name: Not on file  . Number of children: 2  . Years of education: Masters  . Highest education level: Not on file  Occupational History  . Occupation: Landscape architect  Social Needs  . Financial resource strain: Somewhat hard  . Food insecurity:    Worry: Sometimes true    Inability: Sometimes true  . Transportation needs:    Medical: No    Non-medical: No  Tobacco Use  . Smoking status: Former Smoker    Packs/day: 0.00    Years: 41.00    Pack years: 0.00    Types: Cigarettes    Last attempt to quit: 03/05/2015    Years since quitting:  2.8  . Smokeless tobacco: Never Used  . Tobacco comment: 04/29/2015 "quit smoking cigarettes 02/27/2015"  Substance and Sexual Activity  . Alcohol use: No  . Drug use: No  . Sexual activity: Not Currently    Birth control/protection: Post-menopausal, Surgical  Lifestyle  . Physical activity:    Days per week: 0 days    Minutes per session: 0 min  . Stress: Very much  Relationships  . Social connections:    Talks on phone: More than three times a week    Gets together: More than three times a week    Attends religious service: More than 4 times per year    Active member of club or organization: No    Attends meetings of clubs or organizations: Never    Relationship status: Never married  . Intimate partner violence:    Fear of current or ex partner: No    Emotionally abused: No    Physically abused: No    Forced sexual activity: No  Other Topics Concern  . 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      Lives with sister in [place].      Tobacco: 1 ppd x 41 years - quit 2016      Alcohol: none      Drugs: none      Exercise:  Walking in neighborhood; physical job.   Right-handed.   2 cups caffeine daily.      PHYSICAL EXAM  Vitals:   01/19/18 0959  BP: 109/63  Pulse: 61  Weight: 176 lb 8 oz (80.1 kg)  Height: '5\' 4"'  (1.626 m)   Body mass index is 30.3 kg/m.  Generalized: Well developed, in no acute distress   Neurological examination  Mentation: Alert oriented to time, place, history taking. Follows all commands speech and language fluent Cranial nerve II-XII:  Extraocular movements were full, visual field were full on confrontational test. Facial sensation and strength were normal. Uvula tongue midline. Head turning and shoulder shrug   were normal and symmetric. Motor: The motor testing reveals 5 over 5 strength of all 4 extremities. Good symmetric motor tone is noted throughout.  Sensory: Sensory testing is intact to soft touch on all 4 extremities. No evidence of extinction is noted.  Gait and station: Patient is in a wheelchair. Reflexes: Deep tendon reflexes are symmetric and normal bilaterally.   DIAGNOSTIC DATA (LABS, IMAGING, TESTING) - I reviewed patient records, labs, notes, testing and imaging myself where available.  Lab Results  Component Value Date   WBC 10.0 01/09/2018   HGB 8.8 (L) 01/09/2018   HCT 29.1 (L) 01/09/2018   MCV 87.9 01/09/2018   PLT 620 (H) 01/09/2018      Component Value Date/Time   NA 136 01/09/2018 0452   NA 137 09/19/2017 1649   K 4.9 01/09/2018 0452   CL 97 (L) 01/09/2018 0452   CO2 31 01/09/2018 0452   GLUCOSE 138 (H) 01/09/2018 0452   BUN 21 01/09/2018 0452   BUN 17 09/19/2017 1649   CREATININE 1.27 (H) 01/09/2018 0452   CREATININE 1.25 (H) 01/09/2018 0452   CREATININE 0.82 02/05/2016 1047   CALCIUM 9.3 01/09/2018 0452   PROT 7.1 09/19/2017 1649   ALBUMIN 2.4 (L) 01/05/2018 0321   ALBUMIN 4.2 09/19/2017 1649   AST 13 09/19/2017 1649   ALT 10 09/19/2017 1649   ALKPHOS 79 09/19/2017 1649   BILITOT 0.3 09/19/2017 1649   GFRNONAA 43 (L) 01/09/2018 0452   GFRNONAA 44 (L) 01/09/2018 0452   GFRNONAA >89 04/13/2015 0905   GFRAA 50 (L) 01/09/2018 0452   GFRAA 51 (L) 01/09/2018 0452   GFRAA >89 04/13/2015 0905   Lab Results  Component Value Date   CHOL 236 (H) 07/22/2017   HDL 30 (L) 07/22/2017   LDLCALC Comment 07/22/2017   LDLDIRECT 97 01/06/2017   TRIG 544 (H) 07/22/2017   CHOLHDL 7.9 (H) 07/22/2017   Lab Results  Component Value Date   HGBA1C 8.7 (A) 12/05/2017   Lab Results  Component Value Date   PRFFMBWG66 599 01/06/2018   Lab Results  Component Value Date   TSH 0.977 08/06/2017      ASSESSMENT AND PLAN 67 y.o. year old female  has a past medical  history of Abnormal EKG (07/31/2013), Arthritis, Asthma, Carpal tunnel syndrome, bilateral, Chronic kidney disease, Complication of anesthesia, Coronary artery disease, Diabetic peripheral neuropathy (HCC) ("since 1996"), GERD (gastroesophageal reflux disease), Goiter, Headache, History of shingles (  06/01/2013), Hyperlipidemia LDL goal <70 (10/13/2015), Hypertension, Hyperthyroidism, PAF (paroxysmal atrial fibrillation) ([place]) (04/29/2015), Pneumonia (~ 1976), Tremors of nervous system, and Type II diabetes mellitus ([place]). here with :  1.  Obstructive sleep apnea on CPAP  The patient CPAP download shows suboptimal compliance with good treatment of her apnea.  I have referred her to to her DME company for a mask refitting.  She is advised that if her symptoms worsen or she develops new symptoms she should let us know.  She will follow-up in 6 months or sooner if needed.   I spent 15 minutes with the patient. 50% of this time was spent reviewing her CPAP download   Ward Givens, MSN, NP-C 01/19/2018, 9:54 AM [facility] [address], [place], [place] [address] [phone]

## 2018-01-19 NOTE — Patient Instructions (Signed)
Your Plan:  Try to use CPAP nightly and >4 hours each night If your symptoms worsen or you develop new symptoms please let us know.   Thank you for coming to see [place] at [facility]. I hope we have been able to provide you high quality care today.  You may receive a patient satisfaction survey over the next few weeks. We would appreciate your feedback and comments so that we may continue to improve ourselves and the health of our patients.

## 2018-01-20 DIAGNOSIS — J9601 Acute respiratory failure with hypoxia: Secondary | ICD-10-CM | POA: Diagnosis not present

## 2018-01-20 DIAGNOSIS — K5900 Constipation, unspecified: Secondary | ICD-10-CM | POA: Diagnosis not present

## 2018-01-20 DIAGNOSIS — N183 Chronic kidney disease, stage 3 (moderate): Secondary | ICD-10-CM | POA: Diagnosis not present

## 2018-01-20 DIAGNOSIS — A419 Sepsis, unspecified organism: Secondary | ICD-10-CM | POA: Diagnosis not present

## 2018-01-20 DIAGNOSIS — W19XXXA Unspecified fall, initial encounter: Secondary | ICD-10-CM | POA: Diagnosis not present

## 2018-01-20 DIAGNOSIS — I2510 Atherosclerotic heart disease of native coronary artery without angina pectoris: Secondary | ICD-10-CM | POA: Diagnosis not present

## 2018-01-20 DIAGNOSIS — I5032 Chronic diastolic (congestive) heart failure: Secondary | ICD-10-CM | POA: Diagnosis not present

## 2018-01-20 DIAGNOSIS — L03116 Cellulitis of left lower limb: Secondary | ICD-10-CM | POA: Diagnosis not present

## 2018-01-20 DIAGNOSIS — M6281 Muscle weakness (generalized): Secondary | ICD-10-CM | POA: Diagnosis not present

## 2018-01-23 ENCOUNTER — Encounter: Payer: Medicare PPO | Admitting: Physician Assistant

## 2018-01-23 DIAGNOSIS — R5081 Fever presenting with conditions classified elsewhere: Secondary | ICD-10-CM | POA: Diagnosis not present

## 2018-01-23 DIAGNOSIS — R3981 Functional urinary incontinence: Secondary | ICD-10-CM | POA: Diagnosis not present

## 2018-01-25 ENCOUNTER — Encounter (INDEPENDENT_AMBULATORY_CARE_PROVIDER_SITE_OTHER): Payer: Self-pay | Admitting: Physician Assistant

## 2018-01-25 ENCOUNTER — Ambulatory Visit (INDEPENDENT_AMBULATORY_CARE_PROVIDER_SITE_OTHER): Payer: Medicare PPO

## 2018-01-25 ENCOUNTER — Ambulatory Visit (INDEPENDENT_AMBULATORY_CARE_PROVIDER_SITE_OTHER): Payer: Medicare PPO | Admitting: Physician Assistant

## 2018-01-25 DIAGNOSIS — M62838 Other muscle spasm: Secondary | ICD-10-CM | POA: Diagnosis not present

## 2018-01-25 DIAGNOSIS — Z794 Long term (current) use of insulin: Secondary | ICD-10-CM

## 2018-01-25 DIAGNOSIS — E11628 Type 2 diabetes mellitus with other skin complications: Secondary | ICD-10-CM

## 2018-01-25 DIAGNOSIS — M25561 Pain in right knee: Secondary | ICD-10-CM | POA: Diagnosis not present

## 2018-01-25 DIAGNOSIS — E1142 Type 2 diabetes mellitus with diabetic polyneuropathy: Secondary | ICD-10-CM

## 2018-01-25 DIAGNOSIS — M79669 Pain in unspecified lower leg: Secondary | ICD-10-CM | POA: Diagnosis not present

## 2018-01-25 DIAGNOSIS — L03116 Cellulitis of left lower limb: Secondary | ICD-10-CM

## 2018-01-25 DIAGNOSIS — S86911A Strain of unspecified muscle(s) and tendon(s) at lower leg level, right leg, initial encounter: Secondary | ICD-10-CM

## 2018-01-25 DIAGNOSIS — Z89431 Acquired absence of right foot: Secondary | ICD-10-CM

## 2018-01-25 DIAGNOSIS — R4181 Age-related cognitive decline: Secondary | ICD-10-CM | POA: Diagnosis not present

## 2018-01-25 DIAGNOSIS — R630 Anorexia: Secondary | ICD-10-CM | POA: Diagnosis not present

## 2018-01-25 MED ORDER — METHYLPREDNISOLONE ACETATE 40 MG/ML IJ SUSP
40.0000 mg | INTRAMUSCULAR | Status: AC | PRN
  Administered 2018-01-25: 40 mg via INTRA_ARTICULAR

## 2018-01-25 MED ORDER — LIDOCAINE HCL 1 % IJ SOLN
5.0000 mL | INTRAMUSCULAR | Status: AC | PRN
  Administered 2018-01-25: 5 mL

## 2018-01-25 NOTE — Progress Notes (Signed)
Office Visit Note   Patient: Judith [name]           Date of Birth: 08/20/1950           MRN: [id number] Visit Date: 01/25/2018              Requested by: No referring provider defined for this encounter. PCP: [name], PA-C  No chief complaint on file.     HPI: Patient is a 67 year old female who seen for postoperative follow-up following irrigation and debridement of her left foot abscess/cellulitis on 01/06/2018.  She is approximately 3 weeks postop.  OR cultures were consistent with staph coagulase-negative and strep mitis which was pansensitive.  She is continuing on doxycycline for a total of 4 weeks.  She is residing at [facility] skilled nursing facility and has been maintaining nonweightbearing over the left lower extremity. She reports that last week she twisted on her right knee and reports that her right knee immediately became swollen and she is been having a lot of pain in the knee.  She does have a remote history of an ACL repair on the right.  She is been taking Flexeril 5 mg 3 times daily as needed for this discomfort but she is concerned that she may have had acute internal derangement of the knee.  She further reports that she has been having a lot of diarrhea and we discussed that she needs to be checked for C. difficile infection given the long-term antibiotic therapy she is been on.  She should have this done at the skilled nursing facility and this was discussed with the patient and orders written for the skilled nursing facility to check this.   Assessment & Plan: Visit Diagnoses:  1. Cellulitis of left foot   2. Type 2 diabetes mellitus with other skin complication, with long-term current use of insulin (HCC)   3. Diabetic peripheral neuropathy ([place])   4. S/P transmetatarsal amputation of foot, right ([place])   5. Right knee pain, unspecified chronicity   6. Knee strain, right, initial encounter     Plan: The right knee was x-rayed this visit and showed no  evidence of acute injury.  She does have some calcification of the meniscus and some medial joint line narrowing with mild arthritic changes but no acute evidence of any fractures or other acute findings..  The right knee was injected with lidocaine and Depo-Medrol after informed consent the patient tolerated this well.  We will provide the patient with a right knee sleeve for support of the right knee and follow-up with Dr. Sharol Given next week.  Have sent orders for the skilled nursing facility to check the patient for C. difficile toxin as she is reporting frequently having diarrhea.  We did discuss that she is looking much better from the standpoint of her left foot cellulitis and she is several weeks into her doxycycline therapy and may be able to discontinue if necessary.  We are going to continue the sutures of the left foot for nail.  We recommended Silvadene cream to the incisions after cleaning daily and cover with 4 x 4 Kerlix and an Ace wrap for edema control.Continue nonweightbearing on the left lower extremity.    Continue to elevate the extremity as much as possible. She and her partner report that she is going to be discharged from skilled nursing later this week. Follow-Up Instructions: Return in about 1 week (around 02/01/2018).   Ortho Exam  Patient is alert, oriented, no adenopathy,  well-dressed, normal affect, normal respiratory effort. The left foot incision has some mild dehiscence over the dorsal incision.  Sutures are intact and still under some tension.  The plantar surface incision is slightly widened but sutures still intact.  Scant drainage.  There is no erythema of the foot today and only mild edema.  Exam is her right knee shows small effusion.  Tenderness with range of motion but full extension and at least 95 degrees of flexion.  There is no signs of cellulitis.  No increased warmth.  The knee does not feel unstable.  Imaging: No results found. No images are attached to the  encounter.  Labs: Lab Results  Component Value Date   HGBA1C 8.7 (A) 12/05/2017   HGBA1C 7.9 (H) 08/06/2017   HGBA1C 9.2 (H) 07/04/2017   ESRSEDRATE 133 (H) 01/02/2018   CRP 23.5 (H) 01/02/2018   REPTSTATUS 01/12/2018 FINAL 01/06/2018   GRAMSTAIN  01/06/2018    MODERATE WBC PRESENT, PREDOMINANTLY PMN RARE GRAM POSITIVE COCCI    CULT  01/06/2018    RARE STREPTOCOCCUS MITIS/ORALIS RARE STAPHYLOCOCCUS SPECIES (COAGULASE NEGATIVE) NO ANAEROBES ISOLATED Performed at [facility], [place] [address], [place], [place] [address]    LABORGA STREPTOCOCCUS MITIS/ORALIS 01/06/2018     Lab Results  Component Value Date   ALBUMIN 2.4 (L) 01/05/2018   ALBUMIN 4.2 09/19/2017   ALBUMIN 4.0 07/22/2017    There is no height or weight on file to calculate BMI.  Orders:  Orders Placed This Encounter  Procedures  . XR KNEE 3 VIEW RIGHT   No orders of the defined types were placed in this encounter.    Procedures: Large Joint Inj: R knee on 01/25/2018 2:30 PM Indications: pain and diagnostic evaluation Details: 22 G 1.5 in needle, anteromedial approach  Arthrogram: No  Medications: 5 mL lidocaine 1 %; 40 mg methylPREDNISolone acetate 40 MG/ML Outcome: tolerated well, no immediate complications Procedure, treatment alternatives, risks and benefits explained, specific risks discussed. Consent was given by the patient. Immediately prior to procedure a time out was called to verify the correct patient, procedure, equipment, support staff and site/side marked as required. Patient was prepped and draped in the usual sterile fashion.      Clinical Data: No additional findings.  ROS:  All other systems negative, except as noted in the HPI. Review of Systems  Objective: Vital Signs: There were no vitals taken for this visit.  Specialty Comments:  No specialty comments available.  PMFS History: Patient Active Problem List   Diagnosis Date Noted  . Cutaneous abscess of left  foot   . Acute respiratory failure with hypoxia ([place])   . Cellulitis 01/02/2018  . LGI bleed   . Acute blood loss anemia   . Wide-complex tachycardia ([place]) 07/04/2017  . Type II diabetes mellitus, uncontrolled ([place]) 07/04/2017  . Peripheral neuropathy 07/04/2017  . H/O hyperthyroidism 07/04/2017  . S/P transmetatarsal amputation of foot, right ([place]) 04/19/2016  . Obstructive sleep apnea 11/26/2015  . Bilateral carpal tunnel syndrome 11/26/2015  . Hypomagnesemia 11/16/2015  . Hyperlipidemia LDL goal <70 10/13/2015  . Abnormality of gait 09/02/2015  . Memory loss 08/12/2015  . Diabetic peripheral neuropathy ([place]) 08/12/2015  . Vitamin D deficiency 08/12/2015  . Hyperthyroidism 04/30/2015  . Heme positive stool   . Persistent atrial fibrillation ([place]) 04/29/2015  . Coronary artery disease with stable angina pectoris ([place]) 03/29/2015  . Abnormal nuclear stress test   . History of goiter 09/28/2014  . GERD (gastroesophageal reflux disease)  07/31/2013  . Depression with anxiety 06/01/2013  . DM2 (diabetes mellitus, type 2) ([place]) 01/30/2013  . HTN (hypertension) 01/30/2013   Past Medical History:  Diagnosis Date  . Abnormal EKG 07/31/2013  . Arthritis    "hands" (03/06/2015)  . Asthma   . Carpal tunnel syndrome, bilateral   . Chronic kidney disease   . Complication of anesthesia    slow to wake up  . Coronary artery disease     2 v CAD with CTO of the RCA and high grade bifurcational LCx/OM stenosis. S/P PCI DES x 2 to the LCx/OM.  Marland Kitchen Diabetic peripheral neuropathy ([place]) "since 1996"  . GERD (gastroesophageal reflux disease)   . Goiter   . Headache    migraines prior to menopause  . History of shingles 06/01/2013  . Hyperlipidemia LDL goal <70 10/13/2015  . Hypertension   . Hyperthyroidism   . PAF (paroxysmal atrial fibrillation) ([place]) 04/29/2015   CHADS2VASC score of 5 now on Apixaban  . Pneumonia ~ 1976  . Tremors of nervous system   . Type II diabetes mellitus (HCC)     insulin dependent    Family History  Problem Relation Age of Onset  . Cancer Mother 39       bronchial cancer  . Breast cancer Mother   . Lung cancer Mother   . Hypertension Father   . COPD Father   . Heart disease Father 83       CAD with cardiac stenting  . Heart attack Father   . Parkinson's disease Father   . Allergies Sister   . Breast cancer Maternal Grandmother   . Emphysema Maternal Grandfather   . Leukemia Paternal Grandmother   . Emphysema Paternal Grandfather   . Thyroid disease Neg Hx     Past Surgical History:  Procedure Laterality Date  . ABDOMINAL HYSTERECTOMY  1988   age 67; CERVICAL DYSPLASIA; ovaries intact.   . AMPUTATION Right 01/23/2016   Procedure: Right 3rd Ray Amputation;  Surgeon: [name], MD;  Location: [facility];  Service: Orthopedics;  Laterality: Right;  . AMPUTATION Right 02/13/2016   Procedure: Right Transmetatarsal Amputation;  Surgeon: [name], MD;  Location: [facility];  Service: Orthopedics;  Laterality: Right;  . CARDIAC CATHETERIZATION N/A 02/27/2015   Procedure: Left Heart Cath and Coronary Angiography;  Surgeon: [name], MD; LAD 40%, mCFX 80%, OM 70%, RCA 100% calcified       . CARDIAC CATHETERIZATION N/A 03/06/2015   Procedure: Coronary Stent Intervention;  Surgeon: [name], MD;  Location: [facility];  Service: Cardiovascular;  Laterality: N/A;  Mid CX 3.50x12 promus DES w/ 0% resdual and Prox OM1 2.50x20 promus DES w/ 20% residual  . CARDIOVERSION    . CARPAL TUNNEL RELEASE Right Nov 2015  . CARPAL TUNNEL RELEASE Right 1992; 05/2014   [name]; [place]  . CESAREAN SECTION  1982; 1984  . FOOT NEUROMA SURGERY Bilateral 2000  . I&D EXTREMITY Left 01/06/2018   Procedure: DEBRIDEMENT ULCER LEFT FOOT;  Surgeon: [name], MD;  Location: [facility];  Service: Orthopedics;  Laterality: Left;  . KNEE ARTHROSCOPY Right ~ 2003   "meniscus repair"  . SHOULDER OPEN ROTATOR CUFF REPAIR Right 1996; 1998   "w/fracture repair"   . THYROID SURGERY  2000   "removed lots of nodules"  . TONSILLECTOMY  1976   Social History   Occupational History  . Occupation: Landscape architect  Tobacco Use  . Smoking status: Former  Smoker    Packs/day: 0.00    Years: 41.00    Pack years: 0.00    Types: Cigarettes    Last attempt to quit: 03/05/2015    Years since quitting: 2.8  . Smokeless tobacco: Never Used  . Tobacco comment: 04/29/2015 "quit smoking cigarettes 02/27/2015"  Substance and Sexual Activity  . Alcohol use: No  . Drug use: No  . Sexual activity: Not Currently    Birth control/protection: Post-menopausal, Surgical

## 2018-01-27 DIAGNOSIS — L03116 Cellulitis of left lower limb: Secondary | ICD-10-CM | POA: Diagnosis not present

## 2018-01-27 DIAGNOSIS — M25562 Pain in left knee: Secondary | ICD-10-CM | POA: Diagnosis not present

## 2018-01-27 DIAGNOSIS — M6281 Muscle weakness (generalized): Secondary | ICD-10-CM | POA: Diagnosis not present

## 2018-01-27 DIAGNOSIS — R52 Pain, unspecified: Secondary | ICD-10-CM | POA: Diagnosis not present

## 2018-01-27 DIAGNOSIS — E1121 Type 2 diabetes mellitus with diabetic nephropathy: Secondary | ICD-10-CM | POA: Diagnosis not present

## 2018-01-27 DIAGNOSIS — W19XXXA Unspecified fall, initial encounter: Secondary | ICD-10-CM | POA: Diagnosis not present

## 2018-01-27 DIAGNOSIS — A419 Sepsis, unspecified organism: Secondary | ICD-10-CM | POA: Diagnosis not present

## 2018-01-27 DIAGNOSIS — J9601 Acute respiratory failure with hypoxia: Secondary | ICD-10-CM | POA: Diagnosis not present

## 2018-01-27 DIAGNOSIS — I5032 Chronic diastolic (congestive) heart failure: Secondary | ICD-10-CM | POA: Diagnosis not present

## 2018-01-29 DIAGNOSIS — E1122 Type 2 diabetes mellitus with diabetic chronic kidney disease: Secondary | ICD-10-CM | POA: Diagnosis not present

## 2018-01-29 DIAGNOSIS — A409 Streptococcal sepsis, unspecified: Secondary | ICD-10-CM | POA: Diagnosis not present

## 2018-01-29 DIAGNOSIS — D631 Anemia in chronic kidney disease: Secondary | ICD-10-CM | POA: Diagnosis not present

## 2018-01-29 DIAGNOSIS — N183 Chronic kidney disease, stage 3 (moderate): Secondary | ICD-10-CM | POA: Diagnosis not present

## 2018-01-29 DIAGNOSIS — I5032 Chronic diastolic (congestive) heart failure: Secondary | ICD-10-CM | POA: Diagnosis not present

## 2018-01-29 DIAGNOSIS — I130 Hypertensive heart and chronic kidney disease with heart failure and stage 1 through stage 4 chronic kidney disease, or unspecified chronic kidney disease: Secondary | ICD-10-CM | POA: Diagnosis not present

## 2018-01-29 DIAGNOSIS — J449 Chronic obstructive pulmonary disease, unspecified: Secondary | ICD-10-CM | POA: Diagnosis not present

## 2018-01-29 DIAGNOSIS — L03116 Cellulitis of left lower limb: Secondary | ICD-10-CM | POA: Diagnosis not present

## 2018-01-29 DIAGNOSIS — L02416 Cutaneous abscess of left lower limb: Secondary | ICD-10-CM | POA: Diagnosis not present

## 2018-01-30 ENCOUNTER — Encounter: Payer: Self-pay | Admitting: Endocrinology

## 2018-01-30 ENCOUNTER — Telehealth: Payer: Self-pay | Admitting: Cardiology

## 2018-01-30 ENCOUNTER — Ambulatory Visit (INDEPENDENT_AMBULATORY_CARE_PROVIDER_SITE_OTHER): Payer: Medicare PPO | Admitting: Endocrinology

## 2018-01-30 VITALS — BP 118/74 | HR 59 | Ht 64.0 in

## 2018-01-30 DIAGNOSIS — E1101 Type 2 diabetes mellitus with hyperosmolarity with coma: Secondary | ICD-10-CM | POA: Diagnosis not present

## 2018-01-30 DIAGNOSIS — E1122 Type 2 diabetes mellitus with diabetic chronic kidney disease: Secondary | ICD-10-CM | POA: Diagnosis not present

## 2018-01-30 DIAGNOSIS — Z794 Long term (current) use of insulin: Secondary | ICD-10-CM

## 2018-01-30 DIAGNOSIS — N183 Chronic kidney disease, stage 3 (moderate): Secondary | ICD-10-CM | POA: Diagnosis not present

## 2018-01-30 DIAGNOSIS — Z23 Encounter for immunization: Secondary | ICD-10-CM | POA: Diagnosis not present

## 2018-01-30 DIAGNOSIS — D631 Anemia in chronic kidney disease: Secondary | ICD-10-CM | POA: Diagnosis not present

## 2018-01-30 DIAGNOSIS — I130 Hypertensive heart and chronic kidney disease with heart failure and stage 1 through stage 4 chronic kidney disease, or unspecified chronic kidney disease: Secondary | ICD-10-CM | POA: Diagnosis not present

## 2018-01-30 DIAGNOSIS — A409 Streptococcal sepsis, unspecified: Secondary | ICD-10-CM | POA: Diagnosis not present

## 2018-01-30 DIAGNOSIS — E0590 Thyrotoxicosis, unspecified without thyrotoxic crisis or storm: Secondary | ICD-10-CM

## 2018-01-30 DIAGNOSIS — L03116 Cellulitis of left lower limb: Secondary | ICD-10-CM | POA: Diagnosis not present

## 2018-01-30 DIAGNOSIS — E1165 Type 2 diabetes mellitus with hyperglycemia: Secondary | ICD-10-CM | POA: Diagnosis not present

## 2018-01-30 DIAGNOSIS — J449 Chronic obstructive pulmonary disease, unspecified: Secondary | ICD-10-CM | POA: Diagnosis not present

## 2018-01-30 DIAGNOSIS — I5032 Chronic diastolic (congestive) heart failure: Secondary | ICD-10-CM | POA: Diagnosis not present

## 2018-01-30 DIAGNOSIS — L02416 Cutaneous abscess of left lower limb: Secondary | ICD-10-CM | POA: Diagnosis not present

## 2018-01-30 LAB — POCT GLYCOSYLATED HEMOGLOBIN (HGB A1C): Hemoglobin A1C: 7.7 % — AB (ref 4.0–5.6)

## 2018-01-30 LAB — T4, FREE: FREE T4: 1.16 ng/dL (ref 0.60–1.60)

## 2018-01-30 LAB — TSH: TSH: 2.01 u[IU]/mL (ref 0.35–4.50)

## 2018-01-30 NOTE — Progress Notes (Signed)
 Subjective:    Patient ID: Judith [name], female    DOB: 11/10/1950, 67 y.o.   MRN: [id number]  HPI Pt returns for f/u of hyperthyroidism (pt says she took synthroid for a brief time in the 1990's, in an attempt to shrink a goiter; slightly suppressed TSH was first noted in late 2016, when she was in the hospital for new-onset AF; she converted back to SR; nuc med scan showed heterogeneous uptake; neck scar is from C-spine procedure). she takes tapazole as rx'ed.  pt states she feels well in general.   Pt also ret for f/u of DM: DM type: Insulin-requiring type 2. Dx'ed: 1992 Complications: polyneuropathy, renal failure, retinopathy, and CAD.   Therapy: insulin since 1997, and metformin.   GDM: never.  DKA: never. Severe hypoglycemia: never.   Pancreatitis: never.   Other: she said reg and novolog both caused sob, so she was changed to qd insulin.  Interval history: She has been back home after surgery, x 3 days.  no cbg record, but states cbg's often in the 70's.   Past Medical History:  Diagnosis Date  . Abnormal EKG 07/31/2013  . Arthritis    "hands" (03/06/2015)  . Asthma   . Carpal tunnel syndrome, bilateral   . Chronic kidney disease   . Complication of anesthesia    slow to wake up  . Coronary artery disease     2 v CAD with CTO of the RCA and high grade bifurcational LCx/OM stenosis. S/P PCI DES x 2 to the LCx/OM.  . Diabetic peripheral neuropathy (HCC) "since 1996"  . GERD (gastroesophageal reflux disease)   . Goiter   . Headache    migraines prior to menopause  . History of shingles 06/01/2013  . Hyperlipidemia LDL goal <70 10/13/2015  . Hypertension   . Hyperthyroidism   . PAF (paroxysmal atrial fibrillation) (HCC) 04/29/2015   CHADS2VASC score of 5 now on Apixaban  . Pneumonia ~ 1976  . Tremors of nervous system   . Type II diabetes mellitus (HCC)    insulin dependent    Past Surgical History:  Procedure Laterality Date  . ABDOMINAL HYSTERECTOMY  1988   age  36; CERVICAL DYSPLASIA; ovaries intact.   . AMPUTATION Right 01/23/2016   Procedure: Right 3rd Ray Amputation;  Surgeon: Marcus V Duda, MD;  Location: [facility];  Service: Orthopedics;  Laterality: Right;  . AMPUTATION Right 02/13/2016   Procedure: Right Transmetatarsal Amputation;  Surgeon: Marcus V Duda, MD;  Location: [facility];  Service: Orthopedics;  Laterality: Right;  . CARDIAC CATHETERIZATION N/A 02/27/2015   Procedure: Left Heart Cath and Coronary Angiography;  Surgeon: Michael Cooper, MD; LAD 40%, mCFX 80%, OM 70%, RCA 100% calcified       . CARDIAC CATHETERIZATION N/A 03/06/2015   Procedure: Coronary Stent Intervention;  Surgeon: Michael Cooper, MD;  Location: [facility];  Service: Cardiovascular;  Laterality: N/A;  Mid CX 3.50x12 promus DES w/ 0% resdual and Prox OM1 2.50x20 promus DES w/ 20% residual  . CARDIOVERSION    . CARPAL TUNNEL RELEASE Right Nov 2015  . CARPAL TUNNEL RELEASE Right 1992; 05/2014   [place]; [place]  . CESAREAN SECTION  1982; 1984  . FOOT NEUROMA SURGERY Bilateral 2000  . I&D EXTREMITY Left 01/06/2018   Procedure: DEBRIDEMENT ULCER LEFT FOOT;  Surgeon: Duda, Marcus V, MD;  Location: [facility];  Service: Orthopedics;  Laterality: Left;  . KNEE ARTHROSCOPY Right ~ 2003   "meniscus repair"  . SHOULDER   OPEN ROTATOR CUFF REPAIR Right 1996; 1998   "w/fracture repair"  . THYROID SURGERY  2000   "removed lots of nodules"  . TONSILLECTOMY  1976    Social History   Socioeconomic History  . Marital status: Single    Spouse name: Not on file  . Number of children: 2  . Years of education: Masters  . Highest education level: Not on file  Occupational History  . Occupation: Food Service Manager  Social Needs  . Financial resource strain: Somewhat hard  . Food insecurity:    Worry: Sometimes true    Inability: Sometimes true  . Transportation needs:    Medical: No    Non-medical: No  Tobacco Use  . Smoking status: Former Smoker    Packs/day: 0.00     Years: 41.00    Pack years: 0.00    Types: Cigarettes    Last attempt to quit: 03/05/2015    Years since quitting: 2.9  . Smokeless tobacco: Never Used  . Tobacco comment: 04/29/2015 "quit smoking cigarettes 02/27/2015"  Substance and Sexual Activity  . Alcohol use: No  . Drug use: No  . Sexual activity: Not Currently    Birth control/protection: Post-menopausal, Surgical  Lifestyle  . Physical activity:    Days per week: 0 days    Minutes per session: 0 min  . Stress: Very much  Relationships  . Social connections:    Talks on phone: More than three times a week    Gets together: More than three times a week    Attends religious service: More than 4 times per year    Active member of club or organization: No    Attends meetings of clubs or organizations: Never    Relationship status: Never married  . Intimate partner violence:    Fear of current or ex partner: No    Emotionally abused: No    Physically abused: No    Forced sexual activity: No  Other Topics Concern  . 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      Lives with sister in [place].      Tobacco: 1 ppd x 41 years - quit 2016      Alcohol: none      Drugs: none      Exercise:  Walking in neighborhood; physical job.   Right-handed.   2 cups caffeine daily.    Current Outpatient Medications on File Prior to Visit  Medication Sig Dispense Refill  . albuterol (PROVENTIL HFA) 108 (90 Base) MCG/ACT inhaler Inhale 1-2 puffs into the lungs every 6 (six) hours as needed for wheezing or shortness of breath. 1 Inhaler 0  . albuterol (PROVENTIL) (2.5 MG/3ML) 0.083% nebulizer solution Take 3 mLs (2.5 mg total) by nebulization every 6 (six) hours as needed for wheezing or shortness of breath. 150  mL 0  . Alcohol Swabs (B-D SINGLE USE SWABS REGULAR) PADS 1 each by Does not apply route 2 (two) times daily. 100 each 2  . amLODipine (NORVASC) 5 MG tablet Take 1 tablet (5 mg total) by mouth daily. 30 tablet 0  . apixaban (ELIQUIS) 5 MG TABS tablet Take 1 tablet (5 mg total) by mouth 2 (two) times daily. 180 tablet 3  .   atorvastatin (LIPITOR) 20 MG tablet Take 1 tablet (20 mg total) by mouth daily. 90 tablet 3  . Blood Glucose Calibration (TRUE METRIX LEVEL 1) Low SOLN 1 each by In Vitro route as needed. 1 each 0  . Blood Glucose Monitoring Suppl (TRUE METRIX AIR GLUCOSE METER) w/Device KIT 1 each by Does not apply route daily. 1 kit 0  . buPROPion (WELLBUTRIN SR) 150 MG 12 hr tablet Take 150 mg by mouth 2 (two) times daily.    Marland Kitchen doxycycline (VIBRA-TABS) 100 MG tablet Take 1 tablet (100 mg total) by mouth every 12 (twelve) hours. For 4 weeks 56 tablet 0  . fenofibrate (TRICOR) 145 MG tablet Take 1 tablet (145 mg total) by mouth daily. 90 tablet 3  . ferrous sulfate 325 (65 FE) MG tablet Take 1 tablet (325 mg total) by mouth 2 (two) times daily with a meal. 30 tablet 3  . fluconazole (DIFLUCAN) 100 MG tablet Take 1 tablet (100 mg total) by mouth daily as needed (yeast infection). 5 tablet 0  . FLUoxetine (PROZAC) 20 MG capsule Take 1 capsule (20 mg total) by mouth at bedtime. 90 capsule 2  . furosemide (LASIX) 20 MG tablet Take 3 tablets (60 mg total) by mouth daily. (Patient taking differently: Take 20 mg by mouth daily. ) 30 tablet   . gabapentin (NEURONTIN) 300 MG capsule Take 3 capsules (900 mg total) by mouth 2 (two) times daily. TAKE 3 CAPSULES BY MOUTH EVERY  MORNING AND 3 CAPSULES AT BEDTIME (Patient taking differently: Take 600 mg by mouth 2 (two) times daily. ) 540 capsule 0  . glucose blood (TRUE METRIX BLOOD GLUCOSE TEST) test strip Used to check blood sugars 2x daily. 100 each 12  . HYDROcodone-acetaminophen (NORCO/VICODIN) 5-325 MG tablet Take 1-2 tablets by mouth every 4 (four) hours  as needed for moderate pain (pain score 4-6). 30 tablet 0  . insulin degludec (TRESIBA FLEXTOUCH) 100 UNIT/ML SOPN FlexTouch Pen Inject 0.2 mLs (20 Units total) into the skin daily at 10 pm. 15 mL 0  . Insulin Pen Needle 32G X 4 MM MISC Use to inject insulin daily 100 each 5  . Insulin Syringe-Needle U-100 (INSULIN SYRINGE .5CC/31GX5/16") 31G X 5/16" 0.5 ML MISC Use to inject insulin 100 each 5  . magnesium oxide (MAG-OX) 400 (241.3 Mg) MG tablet Take 1 tablet (400 mg total) by mouth daily. 30 tablet 6  . metFORMIN (GLUCOPHAGE-XR) 500 MG 24 hr tablet Take 4 tablets (2,000 mg total) by mouth daily with breakfast. (Patient taking differently: Take 1,000 mg by mouth 2 (two) times daily. ) 360 tablet 3  . methimazole (TAPAZOLE) 10 MG tablet Take 1 tablet (10 mg total) by mouth daily. 30 tablet 11  . metoprolol succinate (TOPROL-XL) 25 MG 24 hr tablet Take 1 tablet (25 mg total) by mouth 2 (two) times daily. (Patient taking differently: Take 25 mg by mouth See admin instructions. Takes 1 tablet twice daily. Then 1 tablet daily as needed for a.fib) 180 tablet 3  . nitroGLYCERIN (NITROSTAT) 0.4 MG SL tablet Place 1 tablet (0.4 mg total) under the tongue every 5 (five) minutes as needed for chest pain. 25 tablet 3  . pantoprazole (PROTONIX) 40 MG tablet Take 1 tablet (40 mg total) by mouth daily. 90 tablet 0  . polyethylene glycol (MIRALAX / GLYCOLAX) packet Take 17 g by mouth daily as needed. 14 each 0  . potassium chloride SA (K-DUR,KLOR-CON) 20 MEQ tablet Take 1 tablet (20 mEq total) by mouth  daily.    . senna (SENOKOT) 8.6 MG TABS tablet Take 1 tablet (8.6 mg total) by mouth at bedtime. 120 each 0  . sotalol (BETAPACE) 120 MG tablet Take 1 tablet (120 mg total) by mouth every 12 (twelve) hours. 180 tablet 3  . triamcinolone cream (KENALOG) 0.1 % Apply 1 application topically 2 (two) times daily. (Patient taking differently: Apply 1 application topically 2 (two) times daily as needed (for rash). ) 30 g 0    . TRUEPLUS LANCETS 28G MISC 1 each by Does not apply route 2 (two) times daily. 100 each 2   No current facility-administered medications on file prior to visit.     Allergies  Allergen Reactions  . Contrast Media [Iodinated Diagnostic Agents] Hives    Spoke to patient, Iodine allergy is really IV contrast allergy.   . Dilaudid [Hydromorphone Hcl] Other (See Comments)    HEADACHE  . Novolog [Insulin Aspart] Shortness Of Breath    "breathing problems"  . Codeine Nausea And Vomiting    HIGH DOSES-SEVERE VOMITING  . Iodine Other (See Comments)    MUST HAVE BENADRYL PRIOR TO PROCEDURE AND RIGHT BEFORE TREATMENT TO COUNTERACT REACTION-BLISTERING REACTION DERMATOLOGICAL  . Penicillins Itching, Rash and Other (See Comments)    Has patient had a PCN reaction causing immediate rash, facial/tongue/throat swelling, SOB or lightheadedness with hypotension: no Has patient had a PCN reaction causing severe rash involving mucus membranes or skin necrosis: No Has patient had a PCN reaction that required hospitalization No Has patient had a PCN reaction occurring within the last 10 years: No If all of the above answers are "NO", then may proceed with Cephalosporin use.  CHEST SIZED RASH AND ITCHING   . Propofol Other (See Comments)    "Breathing problems - asthma attack" Can take with benadryl   . Ace Inhibitors Cough  . Demerol [Meperidine] Nausea And Vomiting  . Neosporin [Neomycin-Bacitracin Zn-Polymyx] Itching and Rash    MAKES REACTIONS WORSE WHEN USING AS PROPHYLACTIC  . Percocet [Oxycodone-Acetaminophen] Rash  . Tape Itching and Rash    Family History  Problem Relation Age of Onset  . Cancer Mother 59       bronchial cancer  . Breast cancer Mother   . Lung cancer Mother   . Hypertension Father   . COPD Father   . Heart disease Father 60       CAD with cardiac stenting  . Heart attack Father   . Parkinson's disease Father   . Allergies Sister   . Breast cancer Maternal  Grandmother   . Emphysema Maternal Grandfather   . Leukemia Paternal Grandmother   . Emphysema Paternal Grandfather   . Thyroid disease Neg Hx     BP 118/74   Pulse (!) 59   Ht 5' 4" (1.626 m)   SpO2 97%   BMI 30.30 kg/m    Review of Systems Denies LOC    Objective:   Physical Exam VITAL SIGNS:  See vs page GENERAL: no distress.  In wheelchair Pulses: foot pulses are intact bilaterally.   MSK: no deformity of the feet, except for right transmetatarsal amputation.  CV: no edema of the legs Skin:  No ulcer on the feet or ankles.  normal temp on the feet and ankles.  healing surgical scar at the dorsal aspect of the left foot, with sutures in place.  Neuro: sensation is intact to touch on the feet and ankles, but decreased from normal Ext: There is onychomycosis of the   toenails.   Lab Results  Component Value Date   CREATININE 1.27 (H) 01/09/2018   CREATININE 1.25 (H) 01/09/2018   BUN 21 01/09/2018   NA 136 01/09/2018   K 4.9 01/09/2018   CL 97 (L) 01/09/2018   CO2 31 01/09/2018   Lab Results  Component Value Date   HGBA1C 7.7 (A) 01/30/2018       Assessment & Plan:  Hyperthyroidism: recheck today Insulin-requiring type 2 DM, with renal insuff: this is the best control this pt should aim for, given this regimen, which does match insulin to her changing needs throughout the day Left foot cellulitis: good healing progress.   Patient Instructions  check your blood sugar twice a day.  vary the time of day when you check, between before the 3 meals, and at bedtime.  also check if you have symptoms of your blood sugar being too high or too low.  please keep a record of the readings and bring it to your next appointment here (or you can bring the meter itself).  You can write it on any piece of paper.  please call us sooner if your blood sugar goes below 70, or if you have a lot of readings over 200.  Please continue the same insulins Please come back for a follow-up  appointment in 2 months.

## 2018-01-30 NOTE — Telephone Encounter (Signed)
Pt was prescribed furosemide 20 mg tablet in the hospital. Pt is requesting a refill on this medication. Would Dr. Radford Pax like to refill this mediation? Please address

## 2018-01-30 NOTE — Telephone Encounter (Signed)
Ok to refill furosemide

## 2018-01-30 NOTE — Telephone Encounter (Signed)
Left voicemail for patient to call back. 

## 2018-01-30 NOTE — Patient Instructions (Addendum)
check your blood sugar twice a day.  vary the time of day when you check, between before the 3 meals, and at bedtime.  also check if you have symptoms of your blood sugar being too high or too low.  please keep a record of the readings and bring it to your next appointment here (or you can bring the meter itself).  You can write it on any piece of paper.  please call us sooner if your blood sugar goes below 70, or if you have a lot of readings over 200.  Please continue the same insulins Please come back for a follow-up appointment in 2 months.

## 2018-01-30 NOTE — Telephone Encounter (Signed)
Left the patient a voicemail to call back. The patient in the hospital was discharged on Lasix 60 mg daily. The patient is requesting a refill.   Sending to Dr. Radford Pax for recommendations

## 2018-02-01 ENCOUNTER — Ambulatory Visit (INDEPENDENT_AMBULATORY_CARE_PROVIDER_SITE_OTHER): Payer: Medicare PPO | Admitting: Physician Assistant

## 2018-02-01 ENCOUNTER — Encounter (INDEPENDENT_AMBULATORY_CARE_PROVIDER_SITE_OTHER): Payer: Self-pay | Admitting: Physician Assistant

## 2018-02-01 ENCOUNTER — Telehealth (INDEPENDENT_AMBULATORY_CARE_PROVIDER_SITE_OTHER): Payer: Self-pay | Admitting: Physician Assistant

## 2018-02-01 ENCOUNTER — Telehealth (INDEPENDENT_AMBULATORY_CARE_PROVIDER_SITE_OTHER): Payer: Self-pay | Admitting: Orthopedic Surgery

## 2018-02-01 DIAGNOSIS — E11628 Type 2 diabetes mellitus with other skin complications: Secondary | ICD-10-CM

## 2018-02-01 DIAGNOSIS — L03116 Cellulitis of left lower limb: Secondary | ICD-10-CM | POA: Diagnosis not present

## 2018-02-01 DIAGNOSIS — Z794 Long term (current) use of insulin: Secondary | ICD-10-CM

## 2018-02-01 DIAGNOSIS — A409 Streptococcal sepsis, unspecified: Secondary | ICD-10-CM | POA: Diagnosis not present

## 2018-02-01 DIAGNOSIS — Z89431 Acquired absence of right foot: Secondary | ICD-10-CM

## 2018-02-01 DIAGNOSIS — L02416 Cutaneous abscess of left lower limb: Secondary | ICD-10-CM | POA: Diagnosis not present

## 2018-02-01 DIAGNOSIS — D631 Anemia in chronic kidney disease: Secondary | ICD-10-CM | POA: Diagnosis not present

## 2018-02-01 DIAGNOSIS — E1122 Type 2 diabetes mellitus with diabetic chronic kidney disease: Secondary | ICD-10-CM | POA: Diagnosis not present

## 2018-02-01 DIAGNOSIS — I130 Hypertensive heart and chronic kidney disease with heart failure and stage 1 through stage 4 chronic kidney disease, or unspecified chronic kidney disease: Secondary | ICD-10-CM | POA: Diagnosis not present

## 2018-02-01 DIAGNOSIS — I5032 Chronic diastolic (congestive) heart failure: Secondary | ICD-10-CM | POA: Diagnosis not present

## 2018-02-01 DIAGNOSIS — N183 Chronic kidney disease, stage 3 (moderate): Secondary | ICD-10-CM | POA: Diagnosis not present

## 2018-02-01 DIAGNOSIS — J449 Chronic obstructive pulmonary disease, unspecified: Secondary | ICD-10-CM | POA: Diagnosis not present

## 2018-02-01 LAB — FRUCTOSAMINE: FRUCTOSAMINE: 219 umol/L (ref 190–270)

## 2018-02-01 MED ORDER — FUROSEMIDE 20 MG PO TABS: 60.0000 mg | ORAL_TABLET | Freq: Every day | ORAL | 3 refills | Status: DC

## 2018-02-01 NOTE — Telephone Encounter (Signed)
Please advise on weightbearing status.

## 2018-02-01 NOTE — Telephone Encounter (Signed)
Sent previous note to Dr Sharol Given for clarification on WTB status. Waiting to be advised.

## 2018-02-01 NOTE — Telephone Encounter (Signed)
[facility] [phone]    Verbal orders  Physical Therapy  1 time a week for 1 week   2 times a week for 6 weeks   Gracee would like Dr.Duda to confirm her weight bearing status

## 2018-02-01 NOTE — Progress Notes (Signed)
Office Visit Note   Patient: Judith [name]           Date of Birth: [birth year]           MRN: [id number] Visit Date: 02/01/2018              Requested by: [name], PA-C [place], [place] [address] PCP: [name], PA-C  Chief Complaint  Patient presents with  . Left Foot - Routine Post Op      HPI: Patient is a 67 year old female who is seen for postoperative follow-up following irrigation and debridement of her left foot abscess/cellulitis on 01/06/2018.  She is 3 and half weeks postop.  OR cultures showed staph coagulase-negative and strep mitis which was pansensitive and she is completing a course of doxycycline.  She was having a lot of diarrhea but reports that this is completely resolved.  She has left skilled nursing and is now back at home.  She is trying to maintain nonweightbearing over the left lower extremity.  She is showering and utilizing Dial soap to the left foot daily and then applying Silvadene to the incisional areas and wrapping with an Ace wrap.  She underwent a steroid injection to her right knee last week as she was twisting and turning a lot on the knee and had a history of a remote ACL repair on the right and reports that the injection helped a great deal but she still having some discomfort in the right knee especially at nighttime.  She may need further work-up of the right knee when she has recovered more from her left foot surgery.  Assessment & Plan: Visit Diagnoses:  1. Type 2 diabetes mellitus with other skin complication, with long-term current use of insulin ([place])   2. Cellulitis of left foot   3. Status post transmetatarsal amputation of foot, right ([place])     Plan: We will leave the sutures in place and plan to remove them next week.  Were going to use some silver collagen to the slight widening of her incisions.  She was instructed to continue nonweightbearing as much as possible over the left lower extremity in her Darco shoe.   She is can follow-up next Thursday or sooner should she have difficulties in the interim.  Follow-Up Instructions: No follow-ups on file.   Ortho Exam  Patient is alert, oriented, no adenopathy, well-dressed, normal affect, normal respiratory effort. The dorsal and plantar incisions have some slight widening without drainage.  She does have some residual mild edema of the foot.  There is no signs of cellulitis.  There is some local reaction to the suture.  She has good dorsalis pedis pulse.  Right knee motion is improved and edema is improved from last week.  Imaging: No results found. No images are attached to the encounter.  Labs: Lab Results  Component Value Date   HGBA1C 7.7 (A) 01/30/2018   HGBA1C 8.7 (A) 12/05/2017   HGBA1C 7.9 (H) 08/06/2017   ESRSEDRATE 133 (H) 01/02/2018   CRP 23.5 (H) 01/02/2018   REPTSTATUS 01/12/2018 FINAL 01/06/2018   GRAMSTAIN  01/06/2018    MODERATE WBC PRESENT, PREDOMINANTLY PMN RARE GRAM POSITIVE COCCI    CULT  01/06/2018    RARE STREPTOCOCCUS MITIS/ORALIS RARE STAPHYLOCOCCUS SPECIES (COAGULASE NEGATIVE) NO ANAEROBES ISOLATED Performed at [facility], [place] [address] [place], [place] [address]    [place] MITIS/ORALIS 01/06/2018     Lab Results  Component Value Date  ALBUMIN 2.4 (L) 01/05/2018   ALBUMIN 4.2 09/19/2017   ALBUMIN 4.0 07/22/2017    There is no height or weight on file to calculate BMI.  Orders:  No orders of the defined types were placed in this encounter.  No orders of the defined types were placed in this encounter.    Procedures: No procedures performed  Clinical Data: No additional findings.  ROS:  All other systems negative, except as noted in the HPI. Review of Systems  Objective: Vital Signs: There were no vitals taken for this visit.  Specialty Comments:  No specialty comments available.  PMFS History: Patient Active Problem List   Diagnosis Date Noted  . Cutaneous  abscess of left foot   . Acute respiratory failure with hypoxia ([place])   . Cellulitis 01/02/2018  . LGI bleed   . Acute blood loss anemia   . Wide-complex tachycardia ([place]) 07/04/2017  . Type II diabetes mellitus, uncontrolled ([place]) 07/04/2017  . Peripheral neuropathy 07/04/2017  . H/O hyperthyroidism 07/04/2017  . S/P transmetatarsal amputation of foot, right ([place]) 04/19/2016  . Obstructive sleep apnea 11/26/2015  . Bilateral carpal tunnel syndrome 11/26/2015  . Hypomagnesemia 11/16/2015  . Hyperlipidemia LDL goal <70 10/13/2015  . Abnormality of gait 09/02/2015  . Memory loss 08/12/2015  . Diabetic peripheral neuropathy ([place]) 08/12/2015  . Vitamin D deficiency 08/12/2015  . Hyperthyroidism 04/30/2015  . Heme positive stool   . Persistent atrial fibrillation ([place]) 04/29/2015  . Coronary artery disease with stable angina pectoris ([place]) 03/29/2015  . Abnormal nuclear stress test   . History of goiter 09/28/2014  . GERD (gastroesophageal reflux disease) 07/31/2013  . Depression with anxiety 06/01/2013  . DM2 (diabetes mellitus, type 2) ([place]) 01/30/2013  . HTN (hypertension) 01/30/2013   Past Medical History:  Diagnosis Date  . Abnormal EKG 07/31/2013  . Arthritis    "hands" (03/06/2015)  . Asthma   . Carpal tunnel syndrome, bilateral   . Chronic kidney disease   . Complication of anesthesia    slow to wake up  . Coronary artery disease     2 v CAD with CTO of the RCA and high grade bifurcational LCx/OM stenosis. S/P PCI DES x 2 to the LCx/OM.  Marland Kitchen Diabetic peripheral neuropathy ([place]) "since 1996"  . GERD (gastroesophageal reflux disease)   . Goiter   . Headache    migraines prior to menopause  . History of shingles 06/01/2013  . Hyperlipidemia LDL goal <70 10/13/2015  . Hypertension   . Hyperthyroidism   . PAF (paroxysmal atrial fibrillation) ([place]) 04/29/2015   CHADS2VASC score of 5 now on Apixaban  . Pneumonia ~ 1976  . Tremors of nervous system   . Type II diabetes  mellitus ([facility])    insulin dependent    Family History  Problem Relation Age of Onset  . Cancer Mother 4       bronchial cancer  . Breast cancer Mother   . Lung cancer Mother   . Hypertension Father   . COPD Father   . Heart disease Father 62       CAD with cardiac stenting  . Heart attack Father   . Parkinson's disease Father   . Allergies Sister   . Breast cancer Maternal Grandmother   . Emphysema Maternal Grandfather   . Leukemia Paternal Grandmother   . Emphysema Paternal Grandfather   . Thyroid disease Neg Hx     Past Surgical History:  Procedure Laterality Date  . ABDOMINAL HYSTERECTOMY  1988  age 64; CERVICAL DYSPLASIA; ovaries intact.   . AMPUTATION Right 01/23/2016   Procedure: Right 3rd Ray Amputation;  Surgeon: [name], MD;  Location: [place] [facility];  Service: Orthopedics;  Laterality: Right;  . AMPUTATION Right 02/13/2016   Procedure: Right Transmetatarsal Amputation;  Surgeon: [name], MD;  Location: [facility];  Service: Orthopedics;  Laterality: Right;  . CARDIAC CATHETERIZATION N/A 02/27/2015   Procedure: Left Heart Cath and Coronary Angiography;  Surgeon: [name], MD; LAD 40%, mCFX 80%, OM 70%, RCA 100% calcified       . CARDIAC CATHETERIZATION N/A 03/06/2015   Procedure: Coronary Stent Intervention;  Surgeon: [name], MD;  Location: [facility];  Service: Cardiovascular;  Laterality: N/A;  Mid CX 3.50x12 promus DES w/ 0% resdual and Prox OM1 2.50x20 promus DES w/ 20% residual  . CARDIOVERSION    . CARPAL TUNNEL RELEASE Right Nov 2015  . CARPAL TUNNEL RELEASE Right 1992; 05/2014   [name]; [place]  . CESAREAN SECTION  1982; 1984  . FOOT NEUROMA SURGERY Bilateral 2000  . I&D EXTREMITY Left 01/06/2018   Procedure: DEBRIDEMENT ULCER LEFT FOOT;  Surgeon: [name], MD;  Location: [facility];  Service: Orthopedics;  Laterality: Left;  . KNEE ARTHROSCOPY Right ~ 2003   "meniscus repair"  . SHOULDER OPEN ROTATOR CUFF REPAIR Right 1996; 1998    "w/fracture repair"  . THYROID SURGERY  2000   "removed lots of nodules"  . TONSILLECTOMY  1976   Social History   Occupational History  . Occupation: Landscape architect  Tobacco Use  . Smoking status: Former Smoker    Packs/day: 0.00    Years: 41.00    Pack years: 0.00    Types: Cigarettes    Last attempt to quit: 03/05/2015    Years since quitting: 2.9  . Smokeless tobacco: Never Used  . Tobacco comment: 04/29/2015 "quit smoking cigarettes 02/27/2015"  Substance and Sexual Activity  . Alcohol use: No  . Drug use: No  . Sexual activity: Not Currently    Birth control/protection: Post-menopausal, Surgical

## 2018-02-01 NOTE — Telephone Encounter (Signed)
[place]-(OT) from [facility] called needing verbal orders for HHOT 1 wk 1 and 2 wk 4. She also need weight bearing status for left lower extremity. The number to contact [facility] is [phone]

## 2018-02-01 NOTE — Telephone Encounter (Signed)
Medication sent into pharmacy, awaiting on patient to call back to discuss.

## 2018-02-02 NOTE — Telephone Encounter (Signed)
IC advised per Dr Sharol Given.

## 2018-02-02 NOTE — Telephone Encounter (Signed)
Minimize weightbearing on the left lower extremity until we can remove the sutures at follow-up appointment.

## 2018-02-02 NOTE — Telephone Encounter (Signed)
Judith [name]'s message was for occupational therapy she has not been called back about this, she needs verbal orders -HHOT 1 wk 1 and 2 wk 4. She also needs weight bearing status for left lower extremity. The number to contact [facility] is [phone]

## 2018-02-03 NOTE — Telephone Encounter (Signed)
This was addressed in my previous conversation with her. I called again, no answer. LMVM for her

## 2018-02-05 ENCOUNTER — Emergency Department (HOSPITAL_COMMUNITY)
Admission: EM | Admit: 2018-02-05 | Discharge: 2018-02-05 | Disposition: A | Discharge status: Home / Self Care | Payer: Medicare PPO | Attending: Emergency Medicine | Admitting: Emergency Medicine

## 2018-02-05 ENCOUNTER — Other Ambulatory Visit: Payer: Self-pay

## 2018-02-05 ENCOUNTER — Encounter (HOSPITAL_COMMUNITY): Payer: Self-pay

## 2018-02-05 ENCOUNTER — Emergency Department (HOSPITAL_COMMUNITY): Payer: Medicare PPO

## 2018-02-05 DIAGNOSIS — Z87891 Personal history of nicotine dependence: Secondary | ICD-10-CM | POA: Insufficient documentation

## 2018-02-05 DIAGNOSIS — Z7901 Long term (current) use of anticoagulants: Secondary | ICD-10-CM | POA: Diagnosis not present

## 2018-02-05 DIAGNOSIS — E785 Hyperlipidemia, unspecified: Secondary | ICD-10-CM | POA: Diagnosis not present

## 2018-02-05 DIAGNOSIS — Z79899 Other long term (current) drug therapy: Secondary | ICD-10-CM | POA: Diagnosis not present

## 2018-02-05 DIAGNOSIS — I129 Hypertensive chronic kidney disease with stage 1 through stage 4 chronic kidney disease, or unspecified chronic kidney disease: Secondary | ICD-10-CM | POA: Insufficient documentation

## 2018-02-05 DIAGNOSIS — N189 Chronic kidney disease, unspecified: Secondary | ICD-10-CM | POA: Insufficient documentation

## 2018-02-05 DIAGNOSIS — R1110 Vomiting, unspecified: Secondary | ICD-10-CM | POA: Diagnosis not present

## 2018-02-05 DIAGNOSIS — Z7984 Long term (current) use of oral hypoglycemic drugs: Secondary | ICD-10-CM | POA: Diagnosis not present

## 2018-02-05 DIAGNOSIS — E1140 Type 2 diabetes mellitus with diabetic neuropathy, unspecified: Secondary | ICD-10-CM | POA: Insufficient documentation

## 2018-02-05 DIAGNOSIS — R1010 Upper abdominal pain, unspecified: Secondary | ICD-10-CM

## 2018-02-05 DIAGNOSIS — R1033 Periumbilical pain: Secondary | ICD-10-CM | POA: Diagnosis not present

## 2018-02-05 DIAGNOSIS — R112 Nausea with vomiting, unspecified: Secondary | ICD-10-CM | POA: Diagnosis not present

## 2018-02-05 DIAGNOSIS — I480 Paroxysmal atrial fibrillation: Secondary | ICD-10-CM | POA: Insufficient documentation

## 2018-02-05 DIAGNOSIS — J45909 Unspecified asthma, uncomplicated: Secondary | ICD-10-CM | POA: Diagnosis not present

## 2018-02-05 DIAGNOSIS — I2510 Atherosclerotic heart disease of native coronary artery without angina pectoris: Secondary | ICD-10-CM | POA: Diagnosis not present

## 2018-02-05 DIAGNOSIS — R1012 Left upper quadrant pain: Secondary | ICD-10-CM | POA: Diagnosis not present

## 2018-02-05 LAB — COMPREHENSIVE METABOLIC PANEL
ALK PHOS: 62 U/L (ref 38–126)
ALT: 14 U/L (ref 0–44)
ANION GAP: 14 (ref 5–15)
AST: 26 U/L (ref 15–41)
Albumin: 3.5 g/dL (ref 3.5–5.0)
BILIRUBIN TOTAL: 0.7 mg/dL (ref 0.3–1.2)
BUN: 20 mg/dL (ref 8–23)
CALCIUM: 8.5 mg/dL — AB (ref 8.9–10.3)
CO2: 26 mmol/L (ref 22–32)
CREATININE: 1.15 mg/dL — AB (ref 0.44–1.00)
Chloride: 105 mmol/L (ref 98–111)
GFR calc Af Amer: 57 mL/min — ABNORMAL LOW (ref >60)
GFR calc non Af Amer: 49 mL/min — ABNORMAL LOW (ref >60)
GLUCOSE: 136 mg/dL — AB (ref 70–99)
Potassium: 3.7 mmol/L (ref 3.5–5.1)
Sodium: 145 mmol/L (ref 135–145)
TOTAL PROTEIN: 7.8 g/dL (ref 6.5–8.1)

## 2018-02-05 LAB — CBC WITH DIFFERENTIAL/PLATELET
BASOS PCT: 1 %
Basophils Absolute: 0.1 10*3/uL (ref 0.0–0.1)
Eosinophils Absolute: 0.2 10*3/uL (ref 0.0–0.7)
Eosinophils Relative: 2 %
HEMATOCRIT: 37.2 % (ref 36.0–46.0)
HEMOGLOBIN: 11.9 g/dL — AB (ref 12.0–15.0)
LYMPHS ABS: 1.3 10*3/uL (ref 0.7–4.0)
Lymphocytes Relative: 15 %
MCH: 26.7 pg (ref 26.0–34.0)
MCHC: 32 g/dL (ref 30.0–36.0)
MCV: 83.6 fL (ref 78.0–100.0)
MONOS PCT: 5 %
Monocytes Absolute: 0.4 10*3/uL (ref 0.1–1.0)
NEUTROS ABS: 6.7 10*3/uL (ref 1.7–7.7)
NEUTROS PCT: 77 %
Platelets: 573 10*3/uL — ABNORMAL HIGH (ref 150–400)
RBC: 4.45 MIL/uL (ref 3.87–5.11)
RDW: 15.4 % (ref 11.5–15.5)
WBC: 8.7 10*3/uL (ref 4.0–10.5)

## 2018-02-05 LAB — LIPASE, BLOOD: Lipase: 26 U/L (ref 11–51)

## 2018-02-05 LAB — CBG MONITORING, ED: Glucose-Capillary: 104 mg/dL — ABNORMAL HIGH (ref 70–99)

## 2018-02-05 MED ORDER — PROMETHAZINE HCL 25 MG/ML IJ SOLN
12.5000 mg | Freq: Once | INTRAMUSCULAR | Status: AC
  Administered 2018-02-05: 12.5 mg via INTRAVENOUS
  Filled 2018-02-05: qty 1

## 2018-02-05 MED ORDER — PROMETHAZINE HCL 12.5 MG RE SUPP: 12.5000 mg | Freq: Four times a day (QID) | RECTAL | 0 refills | Status: DC | PRN

## 2018-02-05 MED ORDER — SODIUM CHLORIDE 0.9 % IV BOLUS
500.0000 mL | Freq: Once | INTRAVENOUS | Status: AC
  Administered 2018-02-05: 500 mL via INTRAVENOUS

## 2018-02-05 MED ORDER — PROMETHAZINE HCL 25 MG PO TABS: 12.5000 mg | ORAL_TABLET | Freq: Four times a day (QID) | ORAL | 0 refills | Status: DC | PRN

## 2018-02-05 NOTE — ED Triage Notes (Signed)
Patient is from home. patient has been having vomiting x 2 days. Patient was in rehab following foot surgery. patient has been seen by home health s/p foot surgery. patient's family reports decreased urinary output.

## 2018-02-05 NOTE — ED Provider Notes (Signed)
Medical screening examination/treatment/procedure(s) were conducted as a shared visit with non-physician practitioner(s) and myself.  I personally evaluated the patient during the encounter.  None  67 year old female presents with nausea vomiting x2 days.  Has also had some left upper quadrant pain.  On exam patient has mild tenderness.  Will order abdominal CT and reassess   Lacretia Leigh, MD 02/05/18 1700

## 2018-02-05 NOTE — ED Provider Notes (Signed)
[facility] DEPT Provider Note   CSN: [id number] Arrival date & time: 02/05/18  1337     History   Chief Complaint Chief Complaint  Patient presents with  . Emesis    HPI Judith [name] is a 67 y.o. female.  Patient with history of diabetes, recent partial foot amputation and subsequent rehab stay, history of hysterectomy, history of small bowel obstruction --presents to the emergency department with 2-day history of nausea and vomiting.  Patient denies associated diarrhea.  She has not had any fevers or chest pain.  She has upper abdominal pain that she relates to vomiting.  No urinary symptoms.  She had a home health nurse out today.  She has been taking Zofran without relief.  She denies any abdominal distention or bloating. The onset of this condition was acute. The course is constant. Aggravating factors: none. Alleviating factors: none.       Past Medical History:  Diagnosis Date  . Abnormal EKG 07/31/2013  . Arthritis    "hands" (03/06/2015)  . Asthma   . Carpal tunnel syndrome, bilateral   . Chronic kidney disease   . Complication of anesthesia    slow to wake up  . Coronary artery disease     2 v CAD with CTO of the RCA and high grade bifurcational LCx/OM stenosis. S/P PCI DES x 2 to the LCx/OM.  Marland Kitchen Diabetic peripheral neuropathy ([place]) "since 1996"  . GERD (gastroesophageal reflux disease)   . Goiter   . Headache    migraines prior to menopause  . History of shingles 06/01/2013  . Hyperlipidemia LDL goal <70 10/13/2015  . Hypertension   . Hyperthyroidism   . PAF (paroxysmal atrial fibrillation) ([place]) 04/29/2015   CHADS2VASC score of 5 now on Apixaban  . Pneumonia ~ 1976  . Tremors of nervous system   . Type II diabetes mellitus (HCC)    insulin dependent    Patient Active Problem List   Diagnosis Date Noted  . Cutaneous abscess of left foot   . Acute respiratory failure with hypoxia ([place])   . Cellulitis 01/02/2018  . LGI  bleed   . Acute blood loss anemia   . Wide-complex tachycardia ([place]) 07/04/2017  . Type II diabetes mellitus, uncontrolled ([place]) 07/04/2017  . Peripheral neuropathy 07/04/2017  . H/O hyperthyroidism 07/04/2017  . S/P transmetatarsal amputation of foot, right ([place]) 04/19/2016  . Obstructive sleep apnea 11/26/2015  . Bilateral carpal tunnel syndrome 11/26/2015  . Hypomagnesemia 11/16/2015  . Hyperlipidemia LDL goal <70 10/13/2015  . Abnormality of gait 09/02/2015  . Memory loss 08/12/2015  . Diabetic peripheral neuropathy ([place]) 08/12/2015  . Vitamin D deficiency 08/12/2015  . Hyperthyroidism 04/30/2015  . Heme positive stool   . Persistent atrial fibrillation ([place]) 04/29/2015  . Coronary artery disease with stable angina pectoris ([place]) 03/29/2015  . Abnormal nuclear stress test   . History of goiter 09/28/2014  . GERD (gastroesophageal reflux disease) 07/31/2013  . Depression with anxiety 06/01/2013  . DM2 (diabetes mellitus, type 2) ([place]) 01/30/2013  . HTN (hypertension) 01/30/2013    Past Surgical History:  Procedure Laterality Date  . ABDOMINAL HYSTERECTOMY  1988   age 44; CERVICAL DYSPLASIA; ovaries intact.   . AMPUTATION Right 01/23/2016   Procedure: Right 3rd Ray Amputation;  Surgeon: [name], MD;  Location: [facility];  Service: Orthopedics;  Laterality: Right;  . AMPUTATION Right 02/13/2016   Procedure: Right Transmetatarsal Amputation;  Surgeon: [name], MD;  Location: [facility];  Service: Orthopedics;  Laterality: Right;  . CARDIAC CATHETERIZATION N/A 02/27/2015   Procedure: Left Heart Cath and Coronary Angiography;  Surgeon: [name], MD; LAD 40%, mCFX 80%, OM 70%, RCA 100% calcified       . CARDIAC CATHETERIZATION N/A 03/06/2015   Procedure: Coronary Stent Intervention;  Surgeon: [name], MD;  Location: [facility];  Service: Cardiovascular;  Laterality: N/A;  Mid CX 3.50x12 promus DES w/ 0% resdual and Prox OM1 2.50x20 promus DES w/ 20% residual    . CARDIOVERSION    . CARPAL TUNNEL RELEASE Right Nov 2015  . CARPAL TUNNEL RELEASE Right 1992; 05/2014   [name]; [place]  . CESAREAN SECTION  1982; 1984  . FOOT NEUROMA SURGERY Bilateral 2000  . I&D EXTREMITY Left 01/06/2018   Procedure: DEBRIDEMENT ULCER LEFT FOOT;  Surgeon: [name], MD;  Location: [facility];  Service: Orthopedics;  Laterality: Left;  . KNEE ARTHROSCOPY Right ~ 2003   "meniscus repair"  . SHOULDER OPEN ROTATOR CUFF REPAIR Right 1996; 1998   "w/fracture repair"  . THYROID SURGERY  2000   "removed lots of nodules"  . TONSILLECTOMY  1976     OB History   None      Home Medications    Prior to Admission medications   Medication Sig Start Date End Date Taking? Authorizing Provider  albuterol (PROVENTIL HFA) 108 (90 Base) MCG/ACT inhaler Inhale 1-2 puffs into the lungs every 6 (six) hours as needed for wheezing or shortness of breath. 11/07/15   [name], MD  albuterol (PROVENTIL) (2.5 MG/3ML) 0.083% nebulizer solution Take 3 mLs (2.5 mg total) by nebulization every 6 (six) hours as needed for wheezing or shortness of breath. 08/31/17   [name], PA-C  Alcohol Swabs (B-D SINGLE USE SWABS REGULAR) PADS 1 each by Does not apply route 2 (two) times daily. 08/29/17   Renato Shin, MD  amLODipine (NORVASC) 5 MG tablet Take 1 tablet (5 mg total) by mouth daily. 01/11/18   Regalado, Belkys A, MD  apixaban (ELIQUIS) 5 MG TABS tablet Take 1 tablet (5 mg total) by mouth 2 (two) times daily. 08/17/17   Sueanne Margarita, MD  atorvastatin (LIPITOR) 20 MG tablet Take 1 tablet (20 mg total) by mouth daily. 08/22/17   Sueanne Margarita, MD  Blood Glucose Calibration (TRUE METRIX LEVEL 1) Low SOLN 1 each by In Vitro route as needed. 08/25/17   Renato Shin, MD  Blood Glucose Monitoring Suppl (TRUE METRIX AIR GLUCOSE METER) w/Device KIT 1 each by Does not apply route daily. 08/25/17   Renato Shin, MD  buPROPion [facility] SR) 150 MG 12 hr tablet Take 150 mg by mouth 2  (two) times daily.    [provider]  doxycycline (VIBRA-TABS) 100 MG tablet Take 1 tablet (100 mg total) by mouth every 12 (twelve) hours. For 4 weeks 01/11/18   Bonnielee Haff, MD  fenofibrate (TRICOR) 145 MG tablet Take 1 tablet (145 mg total) by mouth daily. 08/22/17 08/17/18  Sueanne Margarita, MD  ferrous sulfate 325 (65 FE) MG tablet Take 1 tablet (325 mg total) by mouth 2 (two) times daily with a meal. 01/10/18   Regalado, Belkys A, MD  fluconazole (DIFLUCAN) 100 MG tablet Take 1 tablet (100 mg total) by mouth daily as needed (yeast infection). 01/10/18   Regalado, Belkys A, MD  FLUoxetine (PROZAC) 20 MG capsule Take 1 capsule (20 mg total) by mouth at bedtime. 08/31/17   [name],  PA-C  furosemide (LASIX) 20 MG tablet Take 3 tablets (60 mg total) by mouth daily. 02/01/18 02/01/19  Sueanne Margarita, MD  gabapentin (NEURONTIN) 300 MG capsule Take 3 capsules (900 mg total) by mouth 2 (two) times daily. TAKE 3 CAPSULES BY MOUTH EVERY  MORNING AND 3 CAPSULES AT BEDTIME Patient taking differently: Take 600 mg by mouth 2 (two) times daily.  08/31/17   [name], PA-C  glucose blood (TRUE METRIX BLOOD GLUCOSE TEST) test strip Used to check blood sugars 2x daily. 08/25/17   Renato Shin, MD  HYDROcodone-acetaminophen (NORCO/VICODIN) 5-325 MG tablet Take 1-2 tablets by mouth every 4 (four) hours as needed for moderate pain (pain score 4-6). 01/11/18   Bonnielee Haff, MD  insulin degludec (TRESIBA FLEXTOUCH) 100 UNIT/ML SOPN FlexTouch Pen Inject 0.2 mLs (20 Units total) into the skin daily at 10 pm. 07/05/17   Cristal Ford, DO  Insulin Pen Needle 32G X 4 MM MISC Use to inject insulin daily 08/13/16   Renato Shin, MD  Insulin Syringe-Needle U-100 (INSULIN SYRINGE .5CC/31GX5/16") 31G X 5/16" 0.5 ML MISC Use to inject insulin 08/13/16   Renato Shin, MD  magnesium oxide (MAG-OX) 400 (241.3 Mg) MG tablet Take 1 tablet (400 mg total) by mouth daily. 10/07/17   [name], PA-C  metFORMIN  (GLUCOPHAGE-XR) 500 MG 24 hr tablet Take 4 tablets (2,000 mg total) by mouth daily with breakfast. Patient taking differently: Take 1,000 mg by mouth 2 (two) times daily.  08/25/17   Renato Shin, MD  methimazole (TAPAZOLE) 10 MG tablet Take 1 tablet (10 mg total) by mouth daily. 08/25/17   Renato Shin, MD  metoprolol succinate (TOPROL-XL) 25 MG 24 hr tablet Take 1 tablet (25 mg total) by mouth 2 (two) times daily. Patient taking differently: Take 25 mg by mouth See admin instructions. Takes 1 tablet twice daily. Then 1 tablet daily as needed for a.fib 08/22/17   Turner, Eber Hong, MD  nitroGLYCERIN (NITROSTAT) 0.4 MG SL tablet Place 1 tablet (0.4 mg total) under the tongue every 5 (five) minutes as needed for chest pain. 02/10/17   [name], PA-C  pantoprazole (PROTONIX) 40 MG tablet Take 1 tablet (40 mg total) by mouth daily. 08/31/17   [name], PA-C  polyethylene glycol (MIRALAX / GLYCOLAX) packet Take 17 g by mouth daily as needed. 01/10/18   Regalado, Belkys A, MD  potassium chloride SA (K-DUR,KLOR-CON) 20 MEQ tablet Take 1 tablet (20 mEq total) by mouth daily. 01/11/18   Bonnielee Haff, MD  senna (SENOKOT) 8.6 MG TABS tablet Take 1 tablet (8.6 mg total) by mouth at bedtime. 01/11/18   Bonnielee Haff, MD  sotalol (BETAPACE) 120 MG tablet Take 1 tablet (120 mg total) by mouth every 12 (twelve) hours. 08/22/17   Sueanne Margarita, MD  triamcinolone cream (KENALOG) 0.1 % Apply 1 application topically 2 (two) times daily. Patient taking differently: Apply 1 application topically 2 (two) times daily as needed (for rash).  10/07/17   [name], PA-C  TRUEPLUS LANCETS 28G MISC 1 each by Does not apply route 2 (two) times daily. 08/29/17   Renato Shin, MD    Family History Family History  Problem Relation Age of Onset  . Cancer Mother 47       bronchial cancer  . Breast cancer Mother   . Lung cancer Mother   . Hypertension Father   . COPD Father   . Heart disease Father 100  CAD  with cardiac stenting  . Heart attack Father   . Parkinson's disease Father   . Allergies Sister   . Breast cancer Maternal Grandmother   . Emphysema Maternal Grandfather   . Leukemia Paternal Grandmother   . Emphysema Paternal Grandfather   . Thyroid disease Neg Hx     Social History Social History   Tobacco Use  . Smoking status: Former Smoker    Packs/day: 0.00    Years: 41.00    Pack years: 0.00    Types: Cigarettes    Last attempt to quit: 03/05/2015    Years since quitting: 2.9  . Smokeless tobacco: Never Used  . Tobacco comment: 04/29/2015 "quit smoking cigarettes 02/27/2015"  Substance Use Topics  . Alcohol use: No  . Drug use: No     Allergies   Contrast media [iodinated diagnostic agents]; Dilaudid [hydromorphone hcl]; Novolog [insulin aspart]; Codeine; Iodine; Penicillins; Propofol; Ace inhibitors; Demerol [meperidine]; Neosporin [neomycin-bacitracin zn-polymyx]; Percocet [oxycodone-acetaminophen]; and Tape   Review of Systems Review of Systems  Constitutional: Negative for fever.  HENT: Negative for rhinorrhea and sore throat.   Eyes: Negative for redness.  Respiratory: Negative for cough.   Cardiovascular: Negative for chest pain.  Gastrointestinal: Positive for abdominal pain, nausea and vomiting. Negative for constipation and diarrhea.  Genitourinary: Negative for dysuria.  Musculoskeletal: Negative for myalgias.  Skin: Negative for rash.  Neurological: Negative for headaches.     Physical Exam Updated Vital Signs BP (!) 152/98 (BP Location: Right Arm)   Pulse 80   Temp (!) 97.5 F (36.4 C) (Oral)   Resp 16   Ht '5\' 4"'  (1.626 m)   Wt 72.6 kg   SpO2 100%   BMI 27.46 kg/m   Physical Exam  Constitutional: She appears well-developed and well-nourished. She appears distressed.  Patient appears uncomfortable  HENT:  Head: Normocephalic and atraumatic.  Mouth/Throat: Oropharynx is clear and moist.  Eyes: Conjunctivae are normal. Right eye  exhibits no discharge. Left eye exhibits no discharge.  Neck: Normal range of motion. Neck supple.  Cardiovascular: Normal rate, regular rhythm and normal heart sounds.  No murmur heard. Pulmonary/Chest: Effort normal and breath sounds normal. No respiratory distress.  Abdominal: Soft. There is tenderness (Moderate tenderness to palpation in the periumbilical and left upper quadrant area). There is no rebound and no guarding.  Neurological: She is alert.  Skin: Skin is warm and dry.  Psychiatric: She has a normal mood and affect.  Nursing note and vitals reviewed.    ED Treatments / Results  Labs (all labs ordered are listed, but only abnormal results are displayed) Labs Reviewed  CBC WITH DIFFERENTIAL/PLATELET - Abnormal; Notable for the following components:      Result Value   Hemoglobin 11.9 (*)    Platelets 573 (*)    All other components within normal limits  COMPREHENSIVE METABOLIC PANEL - Abnormal; Notable for the following components:   Glucose, Bld 136 (*)    Creatinine, Ser 1.15 (*)    Calcium 8.5 (*)    GFR calc non Af Amer 49 (*)    GFR calc Af Amer 57 (*)    All other components within normal limits  CBG MONITORING, ED - Abnormal; Notable for the following components:   Glucose-Capillary 104 (*)    All other components within normal limits  LIPASE, BLOOD    EKG None  Radiology Ct Abdomen Pelvis Wo Contrast  Result Date: 02/05/2018 CLINICAL DATA:  Nausea and vomiting EXAM: CT ABDOMEN AND  PELVIS WITHOUT CONTRAST TECHNIQUE: Multidetector CT imaging of the abdomen and pelvis was performed following the standard protocol without IV contrast. COMPARISON:  08/07/2017 FINDINGS: LOWER CHEST: There is no basilar pleural or apical pericardial effusion. HEPATOBILIARY: The hepatic contours and density are normal. There is no intra- or extrahepatic biliary dilatation. The gallbladder is normal. PANCREAS: The pancreatic parenchymal contours are normal and there is no ductal  dilatation. There is no peripancreatic fluid collection. SPLEEN: Normal. ADRENALS/URINARY TRACT: --Adrenal glands: Normal. --Right kidney/ureter: No hydronephrosis, nephroureterolithiasis, perinephric stranding or solid renal mass. --Left kidney/ureter: No hydronephrosis, nephroureterolithiasis, perinephric stranding or solid renal mass. --Urinary bladder: Normal for degree of distention STOMACH/BOWEL: --Stomach/Duodenum: There is no hiatal hernia or other gastric abnormality. The duodenal course and caliber are normal. --Small bowel: No dilatation or inflammation. --Colon: Rectosigmoid diverticulosis without acute inflammation. --Appendix: Normal. VASCULAR/LYMPHATIC: Atherosclerotic calcification is present within the non-aneurysmal abdominal aorta, without hemodynamically significant stenosis. No abdominal or pelvic lymphadenopathy. REPRODUCTIVE: Status post hysterectomy. No adnexal mass. MUSCULOSKELETAL. No bony spinal canal stenosis or focal osseous abnormality. OTHER: None. IMPRESSION: 1. No acute abnormality of the abdomen or pelvis. 2.  Aortic Atherosclerosis (ICD10-I70.0). Electronically Signed   By: Ulyses Jarred M.D.   On: 02/05/2018 19:51   Dg Abd 2 Views  Result Date: 02/05/2018 CLINICAL DATA:  Vomiting and nausea EXAM: ABDOMEN - 2 VIEW COMPARISON:  January 07, 2018 FINDINGS: Supine and left lateral decubitus images obtained. There is moderate air in stomach. There is stool throughout the colon. There is no appreciable small bowel or large bowel dilatation. No appreciable air-fluid levels. No evident free air. Lung bases are clear. There are a few tiny vascular calcifications in the pelvis. IMPRESSION: Moderate air in stomach. No evident bowel obstruction or free air. Diffuse stool throughout colon. Lung bases clear. Electronically Signed   By: Lowella Grip III M.D.   On: 02/05/2018 15:41    Procedures Procedures (including critical care time)  Medications Ordered in ED Medications    sodium chloride 0.9 % bolus 500 mL (0 mLs Intravenous Stopped 02/05/18 1654)  promethazine (PHENERGAN) injection 12.5 mg (12.5 mg Intravenous Given 02/05/18 1556)     Initial Impression / Assessment and Plan / ED Course  I have reviewed the triage vital signs and the nursing notes.  Pertinent labs & imaging results that were available during my care of the patient were reviewed by me and considered in my medical decision making (see chart for details).     Patient seen and examined. Work-up initiated. Medications ordered.  Lab work-up pending.  Will evaluate abdominal pain with plain film x-rays.  Patient may need CT scan.   Vital signs reviewed and are as follows: BP (!) 152/98 (BP Location: Right Arm)   Pulse 80   Temp (!) 97.5 F (36.4 C) (Oral)   Resp 16   Ht '5\' 4"'  (1.626 m)   Wt 72.6 kg   SpO2 100%   BMI 27.46 kg/m   Patient discussed with and seen with Dr. Zenia Resides.  Patient is feeling a lot better and appears much better after ministration of Phenergan.  She does have some residual abdominal pain.  Will obtain CT.  8:09 PM patient has done well.  She tolerated a cup of oral contrast without vomiting.  Minimal abdominal pain on exam.  She is comfortable discharged home at this time.  She will have family around her as well.  The patient was urged to return to the Emergency Department immediately with worsening of current  symptoms, worsening abdominal pain, persistent vomiting, blood noted in stools, fever, or any other concerns. The patient verbalized understanding.    Final Clinical Impressions(s) / ED Diagnoses   Final diagnoses:  Non-intractable vomiting with nausea, unspecified vomiting type  Upper abdominal pain   N/V/D, abd pain:  Patient with abdominal pain. Vitals are stable, no fever. Labs reassuring. Imaging negative for infection or obstruction. No signs of dehydration, patient is tolerating PO's. Lungs are clear and no signs suggestive of PNA. Low concern for  appendicitis, cholecystitis, pancreatitis, ruptured viscus, UTI, kidney stone, aortic dissection, aortic aneurysm or other emergent abdominal etiology. Supportive therapy indicated with return if symptoms worsen.    ED Discharge Orders         Ordered    promethazine (PHENERGAN) 25 MG tablet  Every 6 hours PRN     02/05/18 1958    promethazine (PHENERGAN) 12.5 MG suppository  Every 6 hours PRN     02/05/18 1958           Carlisle Cater, Hershal Coria 02/05/18 2010    Lacretia Leigh, MD 02/06/18 [phone]

## 2018-02-05 NOTE — Discharge Instructions (Signed)
Please read and follow all provided instructions.  Your diagnoses today include:  1. Non-intractable vomiting with nausea, unspecified vomiting type   2. Upper abdominal pain     Tests performed today include:  Blood counts and electrolytes  Blood tests to check liver and kidney function  Blood tests to check pancreas function  Urine test to look for infection  CT scan - does not show any blockages or obstructions  Vital signs. See below for your results today.   Medications prescribed:   Phenergan (promethazine) - for nausea and vomiting  Take any prescribed medications only as directed.  Home care instructions:   Follow any educational materials contained in this packet.  Follow-up instructions: Please follow-up with your primary care provider in the next 2 days for further evaluation of your symptoms.    Return instructions:  SEEK IMMEDIATE MEDICAL ATTENTION IF:  The pain does not go away or becomes severe   A temperature above 101F develops   Repeated vomiting occurs (multiple episodes)   The pain becomes localized to portions of the abdomen. The right side could possibly be appendicitis. In an adult, the left lower portion of the abdomen could be colitis or diverticulitis.   Blood is being passed in stools or vomit (bright red or black tarry stools)   You develop chest pain, difficulty breathing, dizziness or fainting, or become confused, poorly responsive, or inconsolable (young children)  If you have any other emergent concerns regarding your health  Additional Information: Abdominal (belly) pain can be caused by many things. Your caregiver performed an examination and possibly ordered blood/urine tests and imaging (CT scan, x-rays, ultrasound). Many cases can be observed and treated at home after initial evaluation in the emergency department. Even though you are being discharged home, abdominal pain can be unpredictable. Therefore, you need a repeated exam  if your pain does not resolve, returns, or worsens. Most patients with abdominal pain don't have to be admitted to the hospital or have surgery, but serious problems like appendicitis and gallbladder attacks can start out as nonspecific pain. Many abdominal conditions cannot be diagnosed in one visit, so follow-up evaluations are very important.  Your vital signs today were: BP (!) 143/85    Pulse 73    Temp (!) 97.5 F (36.4 C) (Oral)    Resp 18    Ht 5\' 4"  (1.626 m)    Wt 72.6 kg    SpO2 98%    BMI 27.46 kg/m  If your blood pressure (bp) was elevated above 135/85 this visit, please have this repeated by your doctor within one month. --------------

## 2018-02-05 NOTE — ED Notes (Signed)
Pt not able to tolerate po contrast

## 2018-02-06 ENCOUNTER — Ambulatory Visit: Payer: Medicare PPO | Admitting: Family Medicine

## 2018-02-06 ENCOUNTER — Other Ambulatory Visit: Payer: Self-pay

## 2018-02-06 ENCOUNTER — Ambulatory Visit: Payer: Medicare PPO | Admitting: Endocrinology

## 2018-02-06 ENCOUNTER — Encounter: Payer: Self-pay | Admitting: Family Medicine

## 2018-02-06 VITALS — BP 154/97 | HR 77 | Temp 97.7°F | Resp 18

## 2018-02-06 DIAGNOSIS — E118 Type 2 diabetes mellitus with unspecified complications: Secondary | ICD-10-CM

## 2018-02-06 DIAGNOSIS — E1165 Type 2 diabetes mellitus with hyperglycemia: Secondary | ICD-10-CM

## 2018-02-06 DIAGNOSIS — R112 Nausea with vomiting, unspecified: Secondary | ICD-10-CM | POA: Diagnosis not present

## 2018-02-06 DIAGNOSIS — R197 Diarrhea, unspecified: Secondary | ICD-10-CM | POA: Diagnosis not present

## 2018-02-06 DIAGNOSIS — IMO0002 Reserved for concepts with insufficient information to code with codable children: Secondary | ICD-10-CM

## 2018-02-06 DIAGNOSIS — R1013 Epigastric pain: Secondary | ICD-10-CM

## 2018-02-06 MED ORDER — ONDANSETRON 8 MG PO TBDP: 8.0000 mg | ORAL_TABLET | Freq: Three times a day (TID) | ORAL | 1 refills | Status: DC | PRN

## 2018-02-06 MED ORDER — SACCHAROMYCES BOULARDII 250 MG PO CAPS: 250.0000 mg | ORAL_CAPSULE | Freq: Two times a day (BID) | ORAL | 0 refills | Status: DC

## 2018-02-06 MED ORDER — GI COCKTAIL ~~LOC~~
30.0000 mL | Freq: Once | ORAL | Status: AC
  Administered 2018-02-06: 30 mL via ORAL

## 2018-02-06 NOTE — Patient Instructions (Signed)
° ° ° °  If you have lab work done today you will be contacted with your lab results within the next 2 weeks.  If you have not heard from us then please contact us. The fastest way to get your results is to register for My Chart. ° ° °IF you received an x-ray today, you will receive an invoice from [facility]. Please contact [facility] at [phone] with questions or concerns regarding your invoice.  ° °IF you received labwork today, you will receive an invoice from [facility]. Please contact [facility] at [phone] with questions or concerns regarding your invoice.  ° °Our billing staff will not be able to assist you with questions regarding bills from these companies. ° °You will be contacted with the lab results as soon as they are available. The fastest way to get your results is to activate your My Chart account. Instructions are located on the last page of this paperwork. If you have not heard from us regarding the results in 2 weeks, please contact this office. °  ° ° ° °

## 2018-02-06 NOTE — Progress Notes (Signed)
9/30/20195:20 PM  Judith [name] 1951-01-30, 67 y.o. female [id number]  Chief Complaint  Patient presents with  . Establish Care  . Emesis    X 3 days- pt was seen in the ER last night    HPI:   Patient is a 67 y.o. female with past medical history significant for DM2, HTN, afib, HLP, depression who presents today due to vomiting and diarrhea  Started vomiting Friday evening (4 days) clear liquid  Epigastric pain, belching phenegran helps some Significant diarrhea until about 2 days ago, never able to get sample for c diff H/o previous c diff  Has lost about 20 lbs since august Not really eating, has not taken meds in about 10 days Seen in ER yesterday, labs reviewed  Diabetic for about 20 years  Has had significant issues with diabetic foot infections On doxycycline since sept 4th, however diarrhea and vomiting precedes doxy  Colonoscopy in 2018 - sessile polyps - tubular adenoma, diverticulosis, hemorrhoids EGD in 2018 - candiiasis, small HH  Takes protonix daily  Here with her sister [name]  Home health involved   Lab Results  Component Value Date   HGBA1C 7.7 (A) 01/30/2018   HGBA1C 8.7 (A) 12/05/2017   HGBA1C 7.9 (H) 08/06/2017   Lab Results  Component Value Date   MICROALBUR 22.7 (H) 12/06/2014   [place] Comment 07/22/2017   CREATININE 1.15 (H) 02/05/2018    Patient Care Team: Rutherford Guys, MD as PCP - General (Family Medicine) Sueanne Margarita, MD as PCP - Cardiology (Cardiology) Iran Planas, MD as Consulting Physician (Orthopedic Surgery) Marygrace Drought, MD as Consulting Physician (Ophthalmology) Renato Shin, MD as Consulting Physician (Endocrinology) Sueanne Margarita, MD as Consulting Physician (Cardiology)  Fall Risk  02/06/2018 12/31/2017 10/21/2017 10/07/2017 09/20/2017  Falls in the past year? Yes No Yes Yes Yes  Number falls in past yr: 2 or more - - 2 or more 2 or more  Injury with Fall? No - Yes Yes Yes  Comment - - - - -  Risk  Factor Category  - - - - High Fall Risk  Risk for fall due to : - - - - History of fall(s)  Risk for fall due to: Comment - - - - -  Follow up - - - Falls evaluation completed Falls evaluation completed;Education provided     Depression screen [facility] 2/9 12/31/2017 10/21/2017 10/07/2017  Decreased Interest 0 0 0  Down, Depressed, Hopeless 0 0 0  PHQ - 2 Score 0 0 0  Some recent data might be hidden    Allergies  Allergen Reactions  . Contrast Media [Iodinated Diagnostic Agents] Hives    Spoke to patient, Iodine allergy is really IV contrast allergy.   . Dilaudid [Hydromorphone Hcl] Other (See Comments)    HEADACHE  . Novolog [Insulin Aspart] Shortness Of Breath    "breathing problems"  . Codeine Nausea And Vomiting    HIGH DOSES-SEVERE VOMITING  . Iodine Other (See Comments)    MUST HAVE BENADRYL PRIOR TO PROCEDURE AND RIGHT BEFORE TREATMENT TO COUNTERACT REACTION-BLISTERING REACTION DERMATOLOGICAL  . Penicillins Itching, Rash and Other (See Comments)    Has patient had a PCN reaction causing immediate rash, facial/tongue/throat swelling, SOB or lightheadedness with hypotension: no Has patient had a PCN reaction causing severe rash involving mucus membranes or skin necrosis: No Has patient had a PCN reaction that required hospitalization No Has patient had a PCN reaction occurring within the last 10 years: No If all  of the above answers are "NO", then may proceed with Cephalosporin use.  CHEST SIZED RASH AND ITCHING   . Propofol Other (See Comments)    "Breathing problems - asthma attack" Can take with benadryl   . Ace Inhibitors Cough  . Demerol [Meperidine] Nausea And Vomiting  . Neosporin [Neomycin-Bacitracin Zn-Polymyx] Itching and Rash    MAKES REACTIONS WORSE WHEN USING AS PROPHYLACTIC  . Percocet [Oxycodone-Acetaminophen] Rash  . Tape Itching and Rash    Prior to Admission medications   Medication Sig Start Date End Date Taking? Authorizing Provider  albuterol  (PROVENTIL HFA) 108 (90 Base) MCG/ACT inhaler Inhale 1-2 puffs into the lungs every 6 (six) hours as needed for wheezing or shortness of breath. 11/07/15  Yes [name], MD  albuterol (PROVENTIL) (2.5 MG/3ML) 0.083% nebulizer solution Take 3 mLs (2.5 mg total) by nebulization every 6 (six) hours as needed for wheezing or shortness of breath. 08/31/17  Yes [name], PA-C  Alcohol Swabs (B-D SINGLE USE SWABS REGULAR) PADS 1 each by Does not apply route 2 (two) times daily. 08/29/17  Yes Renato Shin, MD  amLODipine (NORVASC) 5 MG tablet Take 1 tablet (5 mg total) by mouth daily. Patient taking differently: Take 10 mg by mouth daily.  01/11/18  Yes Regalado, Belkys A, MD  apixaban (ELIQUIS) 5 MG TABS tablet Take 1 tablet (5 mg total) by mouth 2 (two) times daily. 08/17/17  Yes Turner, Eber Hong, MD  atorvastatin (LIPITOR) 20 MG tablet Take 1 tablet (20 mg total) by mouth daily. 08/22/17  Yes Turner, Eber Hong, MD  Blood Glucose Calibration (TRUE METRIX LEVEL 1) Low SOLN 1 each by In Vitro route as needed. 08/25/17  Yes Renato Shin, MD  Blood Glucose Monitoring Suppl (TRUE METRIX AIR GLUCOSE METER) w/Device KIT 1 each by Does not apply route daily. 08/25/17  Yes Renato Shin, MD  buPROPion [facility] SR) 150 MG 12 hr tablet Take 150 mg by mouth 2 (two) times daily.   Yes [provider]  doxycycline (VIBRA-TABS) 100 MG tablet Take 1 tablet (100 mg total) by mouth every 12 (twelve) hours. For 4 weeks 01/11/18  Yes Bonnielee Haff, MD  fenofibrate (TRICOR) 145 MG tablet Take 1 tablet (145 mg total) by mouth daily. 08/22/17 08/17/18 Yes Turner, Eber Hong, MD  ferrous sulfate 325 (65 FE) MG tablet Take 1 tablet (325 mg total) by mouth 2 (two) times daily with a meal. 01/10/18  Yes Regalado, Belkys A, MD  fluconazole (DIFLUCAN) 100 MG tablet Take 1 tablet (100 mg total) by mouth daily as needed (yeast infection). 01/10/18  Yes Regalado, Belkys A, MD  FLUoxetine (PROZAC) 20 MG capsule Take 1 capsule (20 mg  total) by mouth at bedtime. 08/31/17  Yes [name], PA-C  furosemide (LASIX) 20 MG tablet Take 3 tablets (60 mg total) by mouth daily. 02/01/18 02/01/19 Yes Turner, Eber Hong, MD  gabapentin (NEURONTIN) 300 MG capsule Take 3 capsules (900 mg total) by mouth 2 (two) times daily. TAKE 3 CAPSULES BY MOUTH EVERY  MORNING AND 3 CAPSULES AT BEDTIME Patient taking differently: Take 600 mg by mouth 2 (two) times daily.  08/31/17  Yes [name], PA-C  glucose blood (TRUE METRIX BLOOD GLUCOSE TEST) test strip Used to check blood sugars 2x daily. 08/25/17  Yes Renato Shin, MD  insulin degludec (TRESIBA FLEXTOUCH) 100 UNIT/ML SOPN FlexTouch Pen Inject 0.2 mLs (20 Units total) into the skin daily at 10 pm. 07/05/17  Yes Cristal Ford,  DO  Insulin Pen Needle 32G X 4 MM MISC Use to inject insulin daily 08/13/16  Yes Renato Shin, MD  Insulin Syringe-Needle U-100 (INSULIN SYRINGE .5CC/31GX5/16") 31G X 5/16" 0.5 ML MISC Use to inject insulin 08/13/16  Yes Renato Shin, MD  magnesium oxide (MAG-OX) 400 (241.3 Mg) MG tablet Take 1 tablet (400 mg total) by mouth daily. 10/07/17  Yes [name], PA-C  metFORMIN (GLUCOPHAGE-XR) 500 MG 24 hr tablet Take 4 tablets (2,000 mg total) by mouth daily with breakfast. Patient taking differently: Take 1,000 mg by mouth 2 (two) times daily.  08/25/17  Yes Renato Shin, MD  methimazole (TAPAZOLE) 10 MG tablet Take 1 tablet (10 mg total) by mouth daily. 08/25/17  Yes Renato Shin, MD  metoprolol succinate (TOPROL-XL) 25 MG 24 hr tablet Take 1 tablet (25 mg total) by mouth 2 (two) times daily. Patient taking differently: Take 25 mg by mouth See admin instructions. Takes 1 tablet twice daily. Then 1 tablet daily as needed for a.fib 08/22/17  Yes Turner, Eber Hong, MD  nitroGLYCERIN (NITROSTAT) 0.4 MG SL tablet Place 1 tablet (0.4 mg total) under the tongue every 5 (five) minutes as needed for chest pain. 02/10/17  Yes [name], PA-C  pantoprazole (PROTONIX) 40 MG tablet  Take 1 tablet (40 mg total) by mouth daily. 08/31/17  Yes [name], PA-C  polyethylene glycol (MIRALAX / GLYCOLAX) packet Take 17 g by mouth daily as needed. Patient taking differently: Take 17 g by mouth daily as needed for mild constipation or moderate constipation.  01/10/18  Yes Regalado, Belkys A, MD  potassium chloride SA (K-DUR,KLOR-CON) 20 MEQ tablet Take 1 tablet (20 mEq total) by mouth daily. 01/11/18  Yes Bonnielee Haff, MD  promethazine (PHENERGAN) 12.5 MG suppository Place 1 suppository (12.5 mg total) rectally every 6 (six) hours as needed for nausea or vomiting. 02/05/18  Yes Carlisle Cater, PA-C  promethazine (PHENERGAN) 25 MG tablet Take 0.5 tablets (12.5 mg total) by mouth every 6 (six) hours as needed for nausea or vomiting. 02/05/18  Yes Carlisle Cater, PA-C  senna (SENOKOT) 8.6 MG TABS tablet Take 1 tablet (8.6 mg total) by mouth at bedtime. 01/11/18  Yes Bonnielee Haff, MD  sotalol (BETAPACE) 120 MG tablet Take 1 tablet (120 mg total) by mouth every 12 (twelve) hours. 08/22/17  Yes Turner, Eber Hong, MD  traMADol (ULTRAM) 50 MG tablet Take 50 mg by mouth every 8 (eight) hours as needed for moderate pain or severe pain.  02/02/18  Yes [provider]  triamcinolone cream (KENALOG) 0.1 % Apply 1 application topically 2 (two) times daily. Patient taking differently: Apply 1 application topically 2 (two) times daily as needed (for rash).  10/07/17  Yes [name], PA-C  TRUEPLUS LANCETS 28G MISC 1 each by Does not apply route 2 (two) times daily. 08/29/17  Yes Renato Shin, MD    Past Medical History:  Diagnosis Date  . Abnormal EKG 07/31/2013  . Arthritis    "hands" (03/06/2015)  . Asthma   . Carpal tunnel syndrome, bilateral   . Chronic kidney disease   . Complication of anesthesia    slow to wake up  . Coronary artery disease     2 v CAD with CTO of the RCA and high grade bifurcational LCx/OM stenosis. S/P PCI DES x 2 to the LCx/OM.  Marland Kitchen Diabetic peripheral  neuropathy ([place]) "since 1996"  . GERD (gastroesophageal reflux disease)   . Goiter   . Headache  migraines prior to menopause  . History of shingles 06/01/2013  . Hyperlipidemia LDL goal <70 10/13/2015  . Hypertension   . Hyperthyroidism   . PAF (paroxysmal atrial fibrillation) ([place]) 04/29/2015   CHADS2VASC score of 5 now on Apixaban  . Pneumonia ~ 1976  . Tremors of nervous system   . Type II diabetes mellitus (HCC)    insulin dependent    Past Surgical History:  Procedure Laterality Date  . ABDOMINAL HYSTERECTOMY  1988   age 62; CERVICAL DYSPLASIA; ovaries intact.   . AMPUTATION Right 01/23/2016   Procedure: Right 3rd Ray Amputation;  Surgeon: [name], MD;  Location: [facility];  Service: Orthopedics;  Laterality: Right;  . AMPUTATION Right 02/13/2016   Procedure: Right Transmetatarsal Amputation;  Surgeon: [name], MD;  Location: [place];  Service: Orthopedics;  Laterality: Right;  . CARDIAC CATHETERIZATION N/A 02/27/2015   Procedure: Left Heart Cath and Coronary Angiography;  Surgeon: [name], MD; LAD 40%, mCFX 80%, OM 70%, RCA 100% calcified       . CARDIAC CATHETERIZATION N/A 03/06/2015   Procedure: Coronary Stent Intervention;  Surgeon: [name], MD;  Location: [place] [facility];  Service: Cardiovascular;  Laterality: N/A;  Mid CX 3.50x12 promus DES w/ 0% resdual and Prox OM1 2.50x20 promus DES w/ 20% residual  . CARDIOVERSION    . CARPAL TUNNEL RELEASE Right Nov 2015  . CARPAL TUNNEL RELEASE Right 1992; 05/2014   [name]; [place]  . CESAREAN SECTION  1982; 1984  . FOOT NEUROMA SURGERY Bilateral 2000  . I&D EXTREMITY Left 01/06/2018   Procedure: DEBRIDEMENT ULCER LEFT FOOT;  Surgeon: [name], MD;  Location: [place];  Service: Orthopedics;  Laterality: Left;  . KNEE ARTHROSCOPY Right ~ 2003   "meniscus repair"  . SHOULDER OPEN ROTATOR CUFF REPAIR Right 1996; 1998   "w/fracture repair"  . THYROID SURGERY  2000   "removed lots of nodules"  .  TONSILLECTOMY  1976    Social History   Tobacco Use  . Smoking status: Former Smoker    Packs/day: 0.00    Years: 41.00    Pack years: 0.00    Types: Cigarettes    Last attempt to quit: 03/05/2015    Years since quitting: 2.9  . Smokeless tobacco: Never Used  . Tobacco comment: 04/29/2015 "quit smoking cigarettes 02/27/2015"  Substance Use Topics  . Alcohol use: No    Family History  Problem Relation Age of Onset  . Cancer Mother 68       bronchial cancer  . Breast cancer Mother   . Lung cancer Mother   . Hypertension Father   . COPD Father   . Heart disease Father 37       CAD with cardiac stenting  . Heart attack Father   . Parkinson's disease Father   . Allergies Sister   . Breast cancer Maternal Grandmother   . Emphysema Maternal Grandfather   . Leukemia Paternal Grandmother   . Emphysema Paternal Grandfather   . Thyroid disease Neg Hx     Review of Systems  Constitutional: Positive for malaise/fatigue and weight loss. Negative for chills and fever.  Respiratory: Negative for cough and shortness of breath.   Cardiovascular: Negative for chest pain, palpitations and leg swelling.  Gastrointestinal: Positive for abdominal pain, diarrhea, nausea and vomiting.   Per hpi  OBJECTIVE:  Blood pressure (!) 154/97, pulse 77, temperature 97.7 F (36.5 C), temperature source Oral, resp. rate 18, SpO2 100 %.  There is no height or weight on file to calculate BMI.   Physical Exam  Constitutional: She is oriented to person, place, and time.  Fraile, slight pallor, in wheelchair, constantly dry heaving/belching,   HENT:  Head: Normocephalic and atraumatic.  Mouth/Throat: Oropharynx is clear and moist. No oropharyngeal exudate.  Eyes: Pupils are equal, round, and reactive to light. Conjunctivae and EOM are normal. No scleral icterus.  Neck: Neck supple.  Cardiovascular: Normal rate, regular rhythm and normal heart sounds. Exam reveals no gallop and no friction rub.  No  murmur heard. Pulmonary/Chest: Effort normal and breath sounds normal. She has no wheezes. She has no rales.  Abdominal: Soft. Bowel sounds are normal. There is no hepatosplenomegaly. There is tenderness in the epigastric area. There is no rebound and no guarding.  Musculoskeletal: She exhibits no edema.  Neurological: She is oriented to person, place, and time.  Skin: Skin is warm and dry.  Psychiatric: She has a normal mood and affect.  Nursing note and vitals reviewed.   Ct Abdomen Pelvis Wo Contrast  Result Date: 02/05/2018 CLINICAL DATA:  Nausea and vomiting EXAM: CT ABDOMEN AND PELVIS WITHOUT CONTRAST TECHNIQUE: Multidetector CT imaging of the abdomen and pelvis was performed following the standard protocol without IV contrast. COMPARISON:  08/07/2017 FINDINGS: LOWER CHEST: There is no basilar pleural or apical pericardial effusion. HEPATOBILIARY: The hepatic contours and density are normal. There is no intra- or extrahepatic biliary dilatation. The gallbladder is normal. PANCREAS: The pancreatic parenchymal contours are normal and there is no ductal dilatation. There is no peripancreatic fluid collection. SPLEEN: Normal. ADRENALS/URINARY TRACT: --Adrenal glands: Normal. --Right kidney/ureter: No hydronephrosis, nephroureterolithiasis, perinephric stranding or solid renal mass. --Left kidney/ureter: No hydronephrosis, nephroureterolithiasis, perinephric stranding or solid renal mass. --Urinary bladder: Normal for degree of distention STOMACH/BOWEL: --Stomach/Duodenum: There is no hiatal hernia or other gastric abnormality. The duodenal course and caliber are normal. --Small bowel: No dilatation or inflammation. --Colon: Rectosigmoid diverticulosis without acute inflammation. --Appendix: Normal. VASCULAR/LYMPHATIC: Atherosclerotic calcification is present within the non-aneurysmal abdominal aorta, without hemodynamically significant stenosis. No abdominal or pelvic lymphadenopathy. REPRODUCTIVE:  Status post hysterectomy. No adnexal mass. MUSCULOSKELETAL. No bony spinal canal stenosis or focal osseous abnormality. OTHER: None. IMPRESSION: 1. No acute abnormality of the abdomen or pelvis. 2.  Aortic Atherosclerosis (ICD10-I70.0). Electronically Signed   By: Ulyses Jarred M.D.   On: 02/05/2018 19:51     ASSESSMENT and PLAN  1. Nausea and vomiting, intractability of vomiting not specified, unspecified vomiting type Improved after GI cocktail. Long standing DM, with chronic vomiting and diarrhea. Recent weight loss. eval for gastroparesis. Discussed ADAT.  - gi cocktail (Maalox,Lidocaine,Donnatal) - NM Gastric Emptying; Future  2. Abdominal pain, epigastric - gi cocktail (Maalox,Lidocaine,Donnatal) - NM Gastric Emptying; Future  3. Type II diabetes mellitus with complication, uncontrolled (HCC) - NM Gastric Emptying; Future  4. Diarrhea, unspecified type Chronic, before abx, adding probiotics, r/o infectious cause. Most likely 2/2 long standing DM. Discussed fiber. Unable to do imodium due to allergies, consider trial of questran - Clostridium Difficile by PCR - Stool culture  Other orders - ondansetron (ZOFRAN-ODT) 8 MG disintegrating tablet; Take 1 tablet (8 mg total) by mouth every 8 (eight) hours as needed for nausea or vomiting. - saccharomyces boulardii (FLORASTOR) 250 MG capsule; Take 1 capsule (250 mg total) by mouth 2 (two) times daily.  Return if symptoms worsen or fail to improve.    Rutherford Guys, MD [facility] [place], [place] [address] Ph.  [phone] Fax [phone]

## 2018-02-07 ENCOUNTER — Telehealth: Payer: Self-pay | Admitting: Family Medicine

## 2018-02-09 ENCOUNTER — Encounter (INDEPENDENT_AMBULATORY_CARE_PROVIDER_SITE_OTHER): Payer: Self-pay | Admitting: Physician Assistant

## 2018-02-09 ENCOUNTER — Ambulatory Visit (INDEPENDENT_AMBULATORY_CARE_PROVIDER_SITE_OTHER): Payer: Medicare PPO | Admitting: Physician Assistant

## 2018-02-09 VITALS — Ht 64.0 in | Wt 160.0 lb

## 2018-02-09 DIAGNOSIS — E1142 Type 2 diabetes mellitus with diabetic polyneuropathy: Secondary | ICD-10-CM

## 2018-02-09 DIAGNOSIS — E11628 Type 2 diabetes mellitus with other skin complications: Secondary | ICD-10-CM

## 2018-02-09 DIAGNOSIS — Z89431 Acquired absence of right foot: Secondary | ICD-10-CM

## 2018-02-09 DIAGNOSIS — Z794 Long term (current) use of insulin: Secondary | ICD-10-CM

## 2018-02-10 ENCOUNTER — Encounter (INDEPENDENT_AMBULATORY_CARE_PROVIDER_SITE_OTHER): Payer: Self-pay | Admitting: Physician Assistant

## 2018-02-10 DIAGNOSIS — L02416 Cutaneous abscess of left lower limb: Secondary | ICD-10-CM | POA: Diagnosis not present

## 2018-02-10 DIAGNOSIS — I5032 Chronic diastolic (congestive) heart failure: Secondary | ICD-10-CM | POA: Diagnosis not present

## 2018-02-10 DIAGNOSIS — A409 Streptococcal sepsis, unspecified: Secondary | ICD-10-CM | POA: Diagnosis not present

## 2018-02-10 DIAGNOSIS — J449 Chronic obstructive pulmonary disease, unspecified: Secondary | ICD-10-CM | POA: Diagnosis not present

## 2018-02-10 DIAGNOSIS — L03116 Cellulitis of left lower limb: Secondary | ICD-10-CM | POA: Diagnosis not present

## 2018-02-10 DIAGNOSIS — D631 Anemia in chronic kidney disease: Secondary | ICD-10-CM | POA: Diagnosis not present

## 2018-02-10 DIAGNOSIS — N183 Chronic kidney disease, stage 3 (moderate): Secondary | ICD-10-CM | POA: Diagnosis not present

## 2018-02-10 DIAGNOSIS — I130 Hypertensive heart and chronic kidney disease with heart failure and stage 1 through stage 4 chronic kidney disease, or unspecified chronic kidney disease: Secondary | ICD-10-CM | POA: Diagnosis not present

## 2018-02-10 DIAGNOSIS — E1122 Type 2 diabetes mellitus with diabetic chronic kidney disease: Secondary | ICD-10-CM | POA: Diagnosis not present

## 2018-02-10 NOTE — Progress Notes (Signed)
Office Visit Note   Patient: Judith [name]           Date of Birth: 1950-11-04           MRN: [id number] Visit Date: 02/09/2018              Requested by: [name], PA-C [address] [place], [place] [address] PCP: Rutherford Guys, MD  Chief Complaint  Patient presents with  . Left Foot - Routine Post Op    01/06/18 left foot I&D      HPI: Patient is a 67 year old female who seen for postoperative follow-up following irrigation and debridement of her left foot abscess/cellulitis on 01/06/2018.  She is now 1 month postop.  Operative cultures had shown staph coagulase-negative and strep mitis and she has completed a course of doxycycline.  She reports no pain in the foot and is been maintaining nonweightbearing over the left lower extremity.  She has been washing the foot daily and applying doxycycline.  She is pleased with her progress.  She does report she had a recent GI bug and had to go to the emergency room for some Phenergan and fluid and she is feeling better although still somewhat weak.  Assessment & Plan: Visit Diagnoses:  1. Type 2 diabetes mellitus with other skin complication, with long-term current use of insulin (HCC)   2. Status post transmetatarsal amputation of foot, right ([place])   3. Diabetic peripheral neuropathy ([place])     Plan: Sutures were harvested this visit.  The patient will continue to wash the foot with Dial soap and water and apply Silvadene to the incisional site.  Continue non-to partial weightbearing in the postop shoe for the next couple of weeks.  She will follow-up in 2 weeks or sooner should she have difficulties in the interim.  Follow-Up Instructions: No follow-ups on file.   Ortho Exam  Patient is alert, oriented, no adenopathy, well-dressed, normal affect, normal respiratory effort. The left foot dorsal and plantar incisions are much improved.  There is no drainage.  Her edema is essentially resolved.  There is no signs of cellulitis.   Sutures were intact but removed this visit.  She has a good dorsalis pedis and posterior tibialis pulse.  Imaging: No results found. No images are attached to the encounter.  Labs: Lab Results  Component Value Date   HGBA1C 7.7 (A) 01/30/2018   HGBA1C 8.7 (A) 12/05/2017   HGBA1C 7.9 (H) 08/06/2017   ESRSEDRATE 133 (H) 01/02/2018   CRP 23.5 (H) 01/02/2018   REPTSTATUS 01/12/2018 FINAL 01/06/2018   GRAMSTAIN  01/06/2018    MODERATE WBC PRESENT, PREDOMINANTLY PMN RARE GRAM POSITIVE COCCI    CULT  01/06/2018    RARE STREPTOCOCCUS MITIS/ORALIS RARE STAPHYLOCOCCUS SPECIES (COAGULASE NEGATIVE) NO ANAEROBES ISOLATED Performed at [facility], [place] [address], [place], [place] [address]    LABORGA STREPTOCOCCUS MITIS/ORALIS 01/06/2018     Lab Results  Component Value Date   ALBUMIN 3.5 02/05/2018   ALBUMIN 2.4 (L) 01/05/2018   ALBUMIN 4.2 09/19/2017    Body mass index is 27.46 kg/m.  Orders:  No orders of the defined types were placed in this encounter.  No orders of the defined types were placed in this encounter.    Procedures: No procedures performed  Clinical Data: No additional findings.  ROS:  All other systems negative, except as noted in the HPI. Review of Systems  Objective: Vital Signs: Ht 5\' 4"  (1.626 m)   Wt  160 lb (72.6 kg)   BMI 27.46 kg/m   Specialty Comments:  No specialty comments available.  PMFS History: Patient Active Problem List   Diagnosis Date Noted  . Cutaneous abscess of left foot   . Acute respiratory failure with hypoxia ([place])   . Cellulitis 01/02/2018  . LGI bleed   . Acute blood loss anemia   . Wide-complex tachycardia ([place]) 07/04/2017  . Type II diabetes mellitus, uncontrolled ([place]) 07/04/2017  . Peripheral neuropathy 07/04/2017  . H/O hyperthyroidism 07/04/2017  . S/P transmetatarsal amputation of foot, right ([place]) 04/19/2016  . Obstructive sleep apnea 11/26/2015  . Bilateral carpal tunnel syndrome 11/26/2015    . Hypomagnesemia 11/16/2015  . Hyperlipidemia LDL goal <70 10/13/2015  . Abnormality of gait 09/02/2015  . Memory loss 08/12/2015  . Diabetic peripheral neuropathy ([place]) 08/12/2015  . Vitamin D deficiency 08/12/2015  . Hyperthyroidism 04/30/2015  . Heme positive stool   . Persistent atrial fibrillation 04/29/2015  . Coronary artery disease with stable angina pectoris ([place]) 03/29/2015  . Abnormal nuclear stress test   . History of goiter 09/28/2014  . GERD (gastroesophageal reflux disease) 07/31/2013  . Depression with anxiety 06/01/2013  . DM2 (diabetes mellitus, type 2) ([place]) 01/30/2013  . HTN (hypertension) 01/30/2013   Past Medical History:  Diagnosis Date  . Abnormal EKG 07/31/2013  . Arthritis    "hands" (03/06/2015)  . Asthma   . Carpal tunnel syndrome, bilateral   . Chronic kidney disease   . Complication of anesthesia    slow to wake up  . Coronary artery disease     2 v CAD with CTO of the RCA and high grade bifurcational LCx/OM stenosis. S/P PCI DES x 2 to the LCx/OM.  Marland Kitchen Diabetic peripheral neuropathy ([place]) "since 1996"  . GERD (gastroesophageal reflux disease)   . Goiter   . Headache    migraines prior to menopause  . History of shingles 06/01/2013  . Hyperlipidemia LDL goal <70 10/13/2015  . Hypertension   . Hyperthyroidism   . PAF (paroxysmal atrial fibrillation) ([place]) 04/29/2015   CHADS2VASC score of 5 now on Apixaban  . Pneumonia ~ 1976  . Tremors of nervous system   . Type II diabetes mellitus (HCC)    insulin dependent    Family History  Problem Relation Age of Onset  . Cancer Mother 46       bronchial cancer  . Breast cancer Mother   . Lung cancer Mother   . Hypertension Father   . COPD Father   . Heart disease Father 23       CAD with cardiac stenting  . Heart attack Father   . Parkinson's disease Father   . Allergies Sister   . Breast cancer Maternal Grandmother   . Emphysema Maternal Grandfather   . Leukemia Paternal Grandmother   .  Emphysema Paternal Grandfather   . Thyroid disease Neg Hx     Past Surgical History:  Procedure Laterality Date  . ABDOMINAL HYSTERECTOMY  1988   age 89; CERVICAL DYSPLASIA; ovaries intact.   . AMPUTATION Right 01/23/2016   Procedure: Right 3rd Ray Amputation;  Surgeon: [name], MD;  Location: [facility];  Service: Orthopedics;  Laterality: Right;  . AMPUTATION Right 02/13/2016   Procedure: Right Transmetatarsal Amputation;  Surgeon: [name], MD;  Location: [facility];  Service: Orthopedics;  Laterality: Right;  . CARDIAC CATHETERIZATION N/A 02/27/2015   Procedure: Left Heart Cath and Coronary Angiography;  Surgeon: [name], MD; LAD  40%, mCFX 80%, OM 70%, RCA 100% calcified       . CARDIAC CATHETERIZATION N/A 03/06/2015   Procedure: Coronary Stent Intervention;  Surgeon: [name], MD;  Location: [facility];  Service: Cardiovascular;  Laterality: N/A;  Mid CX 3.50x12 promus DES w/ 0% resdual and Prox OM1 2.50x20 promus DES w/ 20% residual  . CARDIOVERSION    . CARPAL TUNNEL RELEASE Right Nov 2015  . CARPAL TUNNEL RELEASE Right 1992; 05/2014   [name]; [place]  . CESAREAN SECTION  1982; 1984  . FOOT NEUROMA SURGERY Bilateral 2000  . I&D EXTREMITY Left 01/06/2018   Procedure: DEBRIDEMENT ULCER LEFT FOOT;  Surgeon: [name], MD;  Location: [facility];  Service: Orthopedics;  Laterality: Left;  . KNEE ARTHROSCOPY Right ~ 2003   "meniscus repair"  . SHOULDER OPEN ROTATOR CUFF REPAIR Right 1996; 1998   "w/fracture repair"  . THYROID SURGERY  2000   "removed lots of nodules"  . TONSILLECTOMY  1976   Social History   Occupational History  . Occupation: Landscape architect  Tobacco Use  . Smoking status: Former Smoker    Packs/day: 0.00    Years: 41.00    Pack years: 0.00    Types: Cigarettes    Last attempt to quit: 03/05/2015    Years since quitting: 2.9  . Smokeless tobacco: Never Used  . Tobacco comment: 04/29/2015 "quit smoking cigarettes 02/27/2015"    Substance and Sexual Activity  . Alcohol use: No  . Drug use: No  . Sexual activity: Not Currently    Birth control/protection: Post-menopausal, Surgical

## 2018-02-13 DIAGNOSIS — L03116 Cellulitis of left lower limb: Secondary | ICD-10-CM | POA: Diagnosis not present

## 2018-02-13 DIAGNOSIS — L02416 Cutaneous abscess of left lower limb: Secondary | ICD-10-CM | POA: Diagnosis not present

## 2018-02-13 DIAGNOSIS — A409 Streptococcal sepsis, unspecified: Secondary | ICD-10-CM | POA: Diagnosis not present

## 2018-02-13 DIAGNOSIS — E1122 Type 2 diabetes mellitus with diabetic chronic kidney disease: Secondary | ICD-10-CM | POA: Diagnosis not present

## 2018-02-13 DIAGNOSIS — D631 Anemia in chronic kidney disease: Secondary | ICD-10-CM | POA: Diagnosis not present

## 2018-02-13 DIAGNOSIS — J449 Chronic obstructive pulmonary disease, unspecified: Secondary | ICD-10-CM | POA: Diagnosis not present

## 2018-02-13 DIAGNOSIS — I130 Hypertensive heart and chronic kidney disease with heart failure and stage 1 through stage 4 chronic kidney disease, or unspecified chronic kidney disease: Secondary | ICD-10-CM | POA: Diagnosis not present

## 2018-02-13 DIAGNOSIS — N183 Chronic kidney disease, stage 3 (moderate): Secondary | ICD-10-CM | POA: Diagnosis not present

## 2018-02-13 DIAGNOSIS — I5032 Chronic diastolic (congestive) heart failure: Secondary | ICD-10-CM | POA: Diagnosis not present

## 2018-02-14 ENCOUNTER — Ambulatory Visit (HOSPITAL_COMMUNITY)
Admission: RE | Admit: 2018-02-14 | Discharge: 2018-02-14 | Disposition: A | Discharge status: Home / Self Care | Payer: Medicare PPO | Source: Ambulatory Visit | Attending: Family Medicine | Admitting: Family Medicine

## 2018-02-14 DIAGNOSIS — IMO0002 Reserved for concepts with insufficient information to code with codable children: Secondary | ICD-10-CM

## 2018-02-14 DIAGNOSIS — L02416 Cutaneous abscess of left lower limb: Secondary | ICD-10-CM | POA: Diagnosis not present

## 2018-02-14 DIAGNOSIS — N183 Chronic kidney disease, stage 3 (moderate): Secondary | ICD-10-CM | POA: Diagnosis not present

## 2018-02-14 DIAGNOSIS — G4733 Obstructive sleep apnea (adult) (pediatric): Secondary | ICD-10-CM | POA: Diagnosis not present

## 2018-02-14 DIAGNOSIS — E1165 Type 2 diabetes mellitus with hyperglycemia: Secondary | ICD-10-CM

## 2018-02-14 DIAGNOSIS — E1122 Type 2 diabetes mellitus with diabetic chronic kidney disease: Secondary | ICD-10-CM | POA: Diagnosis not present

## 2018-02-14 DIAGNOSIS — A409 Streptococcal sepsis, unspecified: Secondary | ICD-10-CM | POA: Diagnosis not present

## 2018-02-14 DIAGNOSIS — E118 Type 2 diabetes mellitus with unspecified complications: Secondary | ICD-10-CM | POA: Insufficient documentation

## 2018-02-14 DIAGNOSIS — R112 Nausea with vomiting, unspecified: Secondary | ICD-10-CM | POA: Insufficient documentation

## 2018-02-14 DIAGNOSIS — I5032 Chronic diastolic (congestive) heart failure: Secondary | ICD-10-CM | POA: Diagnosis not present

## 2018-02-14 DIAGNOSIS — J449 Chronic obstructive pulmonary disease, unspecified: Secondary | ICD-10-CM | POA: Diagnosis not present

## 2018-02-14 DIAGNOSIS — R1013 Epigastric pain: Secondary | ICD-10-CM | POA: Insufficient documentation

## 2018-02-14 DIAGNOSIS — L03116 Cellulitis of left lower limb: Secondary | ICD-10-CM | POA: Diagnosis not present

## 2018-02-14 DIAGNOSIS — E119 Type 2 diabetes mellitus without complications: Secondary | ICD-10-CM | POA: Diagnosis not present

## 2018-02-14 DIAGNOSIS — I130 Hypertensive heart and chronic kidney disease with heart failure and stage 1 through stage 4 chronic kidney disease, or unspecified chronic kidney disease: Secondary | ICD-10-CM | POA: Diagnosis not present

## 2018-02-14 DIAGNOSIS — D631 Anemia in chronic kidney disease: Secondary | ICD-10-CM | POA: Diagnosis not present

## 2018-02-14 MED ORDER — TECHNETIUM TC 99M SULFUR COLLOID
2.0000 | Freq: Once | INTRAVENOUS | Status: AC | PRN
  Administered 2018-02-14: 2 via ORAL

## 2018-02-15 ENCOUNTER — Telehealth (INDEPENDENT_AMBULATORY_CARE_PROVIDER_SITE_OTHER): Payer: Self-pay

## 2018-02-15 DIAGNOSIS — N183 Chronic kidney disease, stage 3 (moderate): Secondary | ICD-10-CM | POA: Diagnosis not present

## 2018-02-15 DIAGNOSIS — E1122 Type 2 diabetes mellitus with diabetic chronic kidney disease: Secondary | ICD-10-CM | POA: Diagnosis not present

## 2018-02-15 DIAGNOSIS — J449 Chronic obstructive pulmonary disease, unspecified: Secondary | ICD-10-CM | POA: Diagnosis not present

## 2018-02-15 DIAGNOSIS — I5032 Chronic diastolic (congestive) heart failure: Secondary | ICD-10-CM | POA: Diagnosis not present

## 2018-02-15 DIAGNOSIS — D631 Anemia in chronic kidney disease: Secondary | ICD-10-CM | POA: Diagnosis not present

## 2018-02-15 DIAGNOSIS — L02416 Cutaneous abscess of left lower limb: Secondary | ICD-10-CM | POA: Diagnosis not present

## 2018-02-15 DIAGNOSIS — I130 Hypertensive heart and chronic kidney disease with heart failure and stage 1 through stage 4 chronic kidney disease, or unspecified chronic kidney disease: Secondary | ICD-10-CM | POA: Diagnosis not present

## 2018-02-15 DIAGNOSIS — A409 Streptococcal sepsis, unspecified: Secondary | ICD-10-CM | POA: Diagnosis not present

## 2018-02-15 DIAGNOSIS — L03116 Cellulitis of left lower limb: Secondary | ICD-10-CM | POA: Diagnosis not present

## 2018-02-15 NOTE — Telephone Encounter (Signed)
Gevena Cotton, manager at [place] at [facility] needing clarification on patient being able to take a shower.  Please advise.  CB# is [phone].  Thank You.

## 2018-02-15 NOTE — Telephone Encounter (Signed)
Spoke to [place] with [facility] about the patient will continue to wash the foot with Dial soap and water and apply Silvadene to the incisional site.  Continue non-to partial weightbearing in the postop shoe for the next couple of weeks.  She will follow-up in 2 weeks or sooner should she have difficulties in the interim. She will also take shower but keep incision site dry afterwards.

## 2018-02-17 DIAGNOSIS — I5032 Chronic diastolic (congestive) heart failure: Secondary | ICD-10-CM | POA: Diagnosis not present

## 2018-02-17 DIAGNOSIS — L02416 Cutaneous abscess of left lower limb: Secondary | ICD-10-CM | POA: Diagnosis not present

## 2018-02-17 DIAGNOSIS — L03116 Cellulitis of left lower limb: Secondary | ICD-10-CM | POA: Diagnosis not present

## 2018-02-17 DIAGNOSIS — N183 Chronic kidney disease, stage 3 (moderate): Secondary | ICD-10-CM | POA: Diagnosis not present

## 2018-02-17 DIAGNOSIS — J449 Chronic obstructive pulmonary disease, unspecified: Secondary | ICD-10-CM | POA: Diagnosis not present

## 2018-02-17 DIAGNOSIS — A409 Streptococcal sepsis, unspecified: Secondary | ICD-10-CM | POA: Diagnosis not present

## 2018-02-17 DIAGNOSIS — D631 Anemia in chronic kidney disease: Secondary | ICD-10-CM | POA: Diagnosis not present

## 2018-02-17 DIAGNOSIS — E1122 Type 2 diabetes mellitus with diabetic chronic kidney disease: Secondary | ICD-10-CM | POA: Diagnosis not present

## 2018-02-17 DIAGNOSIS — I130 Hypertensive heart and chronic kidney disease with heart failure and stage 1 through stage 4 chronic kidney disease, or unspecified chronic kidney disease: Secondary | ICD-10-CM | POA: Diagnosis not present

## 2018-02-20 ENCOUNTER — Encounter: Payer: Self-pay | Admitting: *Deleted

## 2018-02-21 DIAGNOSIS — I130 Hypertensive heart and chronic kidney disease with heart failure and stage 1 through stage 4 chronic kidney disease, or unspecified chronic kidney disease: Secondary | ICD-10-CM | POA: Diagnosis not present

## 2018-02-21 DIAGNOSIS — D631 Anemia in chronic kidney disease: Secondary | ICD-10-CM | POA: Diagnosis not present

## 2018-02-21 DIAGNOSIS — N183 Chronic kidney disease, stage 3 (moderate): Secondary | ICD-10-CM | POA: Diagnosis not present

## 2018-02-21 DIAGNOSIS — J449 Chronic obstructive pulmonary disease, unspecified: Secondary | ICD-10-CM | POA: Diagnosis not present

## 2018-02-21 DIAGNOSIS — L02416 Cutaneous abscess of left lower limb: Secondary | ICD-10-CM | POA: Diagnosis not present

## 2018-02-21 DIAGNOSIS — L03116 Cellulitis of left lower limb: Secondary | ICD-10-CM | POA: Diagnosis not present

## 2018-02-21 DIAGNOSIS — I5032 Chronic diastolic (congestive) heart failure: Secondary | ICD-10-CM | POA: Diagnosis not present

## 2018-02-21 DIAGNOSIS — A409 Streptococcal sepsis, unspecified: Secondary | ICD-10-CM | POA: Diagnosis not present

## 2018-02-21 DIAGNOSIS — E1122 Type 2 diabetes mellitus with diabetic chronic kidney disease: Secondary | ICD-10-CM | POA: Diagnosis not present

## 2018-02-22 DIAGNOSIS — J449 Chronic obstructive pulmonary disease, unspecified: Secondary | ICD-10-CM | POA: Diagnosis not present

## 2018-02-22 DIAGNOSIS — N183 Chronic kidney disease, stage 3 (moderate): Secondary | ICD-10-CM | POA: Diagnosis not present

## 2018-02-22 DIAGNOSIS — E1122 Type 2 diabetes mellitus with diabetic chronic kidney disease: Secondary | ICD-10-CM | POA: Diagnosis not present

## 2018-02-22 DIAGNOSIS — D631 Anemia in chronic kidney disease: Secondary | ICD-10-CM | POA: Diagnosis not present

## 2018-02-22 DIAGNOSIS — L03116 Cellulitis of left lower limb: Secondary | ICD-10-CM | POA: Diagnosis not present

## 2018-02-22 DIAGNOSIS — A409 Streptococcal sepsis, unspecified: Secondary | ICD-10-CM | POA: Diagnosis not present

## 2018-02-22 DIAGNOSIS — I130 Hypertensive heart and chronic kidney disease with heart failure and stage 1 through stage 4 chronic kidney disease, or unspecified chronic kidney disease: Secondary | ICD-10-CM | POA: Diagnosis not present

## 2018-02-22 DIAGNOSIS — I5032 Chronic diastolic (congestive) heart failure: Secondary | ICD-10-CM | POA: Diagnosis not present

## 2018-02-22 DIAGNOSIS — L02416 Cutaneous abscess of left lower limb: Secondary | ICD-10-CM | POA: Diagnosis not present

## 2018-02-23 ENCOUNTER — Encounter (INDEPENDENT_AMBULATORY_CARE_PROVIDER_SITE_OTHER): Payer: Self-pay | Admitting: Physician Assistant

## 2018-02-23 ENCOUNTER — Ambulatory Visit (INDEPENDENT_AMBULATORY_CARE_PROVIDER_SITE_OTHER): Payer: Medicare PPO | Admitting: Physician Assistant

## 2018-02-23 ENCOUNTER — Telehealth (INDEPENDENT_AMBULATORY_CARE_PROVIDER_SITE_OTHER): Payer: Self-pay | Admitting: Orthopedic Surgery

## 2018-02-23 VITALS — Ht 64.0 in | Wt 160.0 lb

## 2018-02-23 DIAGNOSIS — L03116 Cellulitis of left lower limb: Secondary | ICD-10-CM | POA: Diagnosis not present

## 2018-02-23 DIAGNOSIS — M7541 Impingement syndrome of right shoulder: Secondary | ICD-10-CM

## 2018-02-23 DIAGNOSIS — E11628 Type 2 diabetes mellitus with other skin complications: Secondary | ICD-10-CM

## 2018-02-23 DIAGNOSIS — L02416 Cutaneous abscess of left lower limb: Secondary | ICD-10-CM | POA: Diagnosis not present

## 2018-02-23 DIAGNOSIS — Z89431 Acquired absence of right foot: Secondary | ICD-10-CM

## 2018-02-23 DIAGNOSIS — I5032 Chronic diastolic (congestive) heart failure: Secondary | ICD-10-CM | POA: Diagnosis not present

## 2018-02-23 DIAGNOSIS — N183 Chronic kidney disease, stage 3 (moderate): Secondary | ICD-10-CM | POA: Diagnosis not present

## 2018-02-23 DIAGNOSIS — E1122 Type 2 diabetes mellitus with diabetic chronic kidney disease: Secondary | ICD-10-CM | POA: Diagnosis not present

## 2018-02-23 DIAGNOSIS — Z794 Long term (current) use of insulin: Secondary | ICD-10-CM

## 2018-02-23 DIAGNOSIS — J449 Chronic obstructive pulmonary disease, unspecified: Secondary | ICD-10-CM | POA: Diagnosis not present

## 2018-02-23 DIAGNOSIS — D631 Anemia in chronic kidney disease: Secondary | ICD-10-CM | POA: Diagnosis not present

## 2018-02-23 DIAGNOSIS — I130 Hypertensive heart and chronic kidney disease with heart failure and stage 1 through stage 4 chronic kidney disease, or unspecified chronic kidney disease: Secondary | ICD-10-CM | POA: Diagnosis not present

## 2018-02-23 DIAGNOSIS — A409 Streptococcal sepsis, unspecified: Secondary | ICD-10-CM | POA: Diagnosis not present

## 2018-02-23 DIAGNOSIS — E1142 Type 2 diabetes mellitus with diabetic polyneuropathy: Secondary | ICD-10-CM

## 2018-02-23 NOTE — Telephone Encounter (Signed)
[facility]/OT Called for verbal orders home health OT 2x week 2 weeks\  Please call to advise [phone]

## 2018-02-24 DIAGNOSIS — I130 Hypertensive heart and chronic kidney disease with heart failure and stage 1 through stage 4 chronic kidney disease, or unspecified chronic kidney disease: Secondary | ICD-10-CM | POA: Diagnosis not present

## 2018-02-24 DIAGNOSIS — L02416 Cutaneous abscess of left lower limb: Secondary | ICD-10-CM | POA: Diagnosis not present

## 2018-02-24 DIAGNOSIS — A409 Streptococcal sepsis, unspecified: Secondary | ICD-10-CM | POA: Diagnosis not present

## 2018-02-24 DIAGNOSIS — D631 Anemia in chronic kidney disease: Secondary | ICD-10-CM | POA: Diagnosis not present

## 2018-02-24 DIAGNOSIS — L03116 Cellulitis of left lower limb: Secondary | ICD-10-CM | POA: Diagnosis not present

## 2018-02-24 DIAGNOSIS — N183 Chronic kidney disease, stage 3 (moderate): Secondary | ICD-10-CM | POA: Diagnosis not present

## 2018-02-24 DIAGNOSIS — E1122 Type 2 diabetes mellitus with diabetic chronic kidney disease: Secondary | ICD-10-CM | POA: Diagnosis not present

## 2018-02-24 DIAGNOSIS — I5032 Chronic diastolic (congestive) heart failure: Secondary | ICD-10-CM | POA: Diagnosis not present

## 2018-02-24 DIAGNOSIS — J449 Chronic obstructive pulmonary disease, unspecified: Secondary | ICD-10-CM | POA: Diagnosis not present

## 2018-02-24 NOTE — Telephone Encounter (Signed)
Called and lm on vm to advise verbal ok for orders below.  

## 2018-02-26 ENCOUNTER — Encounter (INDEPENDENT_AMBULATORY_CARE_PROVIDER_SITE_OTHER): Payer: Self-pay | Admitting: Physician Assistant

## 2018-02-26 DIAGNOSIS — M7541 Impingement syndrome of right shoulder: Secondary | ICD-10-CM

## 2018-02-26 MED ORDER — LIDOCAINE HCL 1 % IJ SOLN
5.0000 mL | INTRAMUSCULAR | Status: AC | PRN
  Administered 2018-02-26: 5 mL

## 2018-02-26 MED ORDER — METHYLPREDNISOLONE ACETATE 40 MG/ML IJ SUSP
40.0000 mg | INTRAMUSCULAR | Status: AC | PRN
  Administered 2018-02-26: 40 mg via INTRA_ARTICULAR

## 2018-02-26 NOTE — Progress Notes (Signed)
Office Visit Note   Patient: Judith [name]           Date of Birth: 1951-02-08           MRN: [id number] Visit Date: 02/23/2018              Requested by: Rutherford Guys, MD [address] [place], [place] [address] PCP: Rutherford Guys, MD  Chief Complaint  Patient presents with  . Left Foot - Follow-up      HPI: Patient is a 67 year old female who is seen for postoperative follow-up following irrigation debridement of her left foot cellulitis.  She has insensate diabetic polyneuropathy.  They have been utilizing Silvadene cream to the residual open areas over the left foot and it continues to improve.  She is wearing a postop shoe and still trying to minimize some ambulation.  She did have a fall last Friday and notes some soreness over her knee and left arm following this but these are all improving.  She is also having some right shoulder pain and reports it hurts to raise her right arm above her head and she requested a steroid injection as she is responded well to this in the past when her shoulder becomes sore.  Assessment & Plan: Visit Diagnoses:  1. Cellulitis of left foot   2. Type 2 diabetes mellitus with other skin complication, with long-term current use of insulin (HCC)   3. Diabetic peripheral neuropathy ([place])   4. Status post transmetatarsal amputation of foot, right ([place])   5. Impingement syndrome of right shoulder     Plan: The left foot is healing well and she is going to continue to use Silvadene cream to the residual incisional area over the dorsum of her foot which is not quite closed.  She may weight-bear as tolerated in a stiff soled shoe.  After informed consent the patient underwent an injection with Depo-Medrol and lidocaine to the right shoulder and she tolerated this well.  She will follow-up here in 4 weeks or sooner should she have difficulties in the interim.  Follow-Up Instructions: Return in about 4 weeks (around 03/23/2018).   Ortho Exam  Patient  is alert, oriented, no adenopathy, well-dressed, normal affect, normal respiratory effort. The patient's left foot continues to heal well.  She does have a small 1.5 x 0.2 cm open area over the incision which is not fully healed but there are no signs of cellulitis.  Mild edema.  Her right shoulder shows pain with forward flexion and internal rotation tenderness palpation over the deltoid and proximal biceps.  She is neurovascularly intact in the right upper extremity distally.  Imaging: No results found. No images are attached to the encounter.  Labs: Lab Results  Component Value Date   HGBA1C 7.7 (A) 01/30/2018   HGBA1C 8.7 (A) 12/05/2017   HGBA1C 7.9 (H) 08/06/2017   ESRSEDRATE 133 (H) 01/02/2018   CRP 23.5 (H) 01/02/2018   REPTSTATUS 01/12/2018 FINAL 01/06/2018   GRAMSTAIN  01/06/2018    MODERATE WBC PRESENT, PREDOMINANTLY PMN RARE GRAM POSITIVE COCCI    CULT  01/06/2018    RARE STREPTOCOCCUS MITIS/ORALIS RARE STAPHYLOCOCCUS SPECIES (COAGULASE NEGATIVE) NO ANAEROBES ISOLATED Performed at [facility], [place] [address] [place], [place] [address]    LABORGA STREPTOCOCCUS MITIS/ORALIS 01/06/2018     Lab Results  Component Value Date   ALBUMIN 3.5 02/05/2018   ALBUMIN 2.4 (L) 01/05/2018   ALBUMIN 4.2 09/19/2017    Body mass index is  27.46 kg/m.  Orders:  Orders Placed This Encounter  Procedures  . Large Joint Inj   No orders of the defined types were placed in this encounter.    Procedures: Large Joint Inj: R subacromial bursa on 02/26/2018 6:36 PM Indications: diagnostic evaluation and pain Details: 22 G 1.5 in needle, posterior approach  Arthrogram: No  Medications: 5 mL lidocaine 1 %; 40 mg methylPREDNISolone acetate 40 MG/ML Outcome: tolerated well, no immediate complications Procedure, treatment alternatives, risks and benefits explained, specific risks discussed. Consent was given by the patient. Immediately prior to procedure a time out was called  to verify the correct patient, procedure, equipment, support staff and site/side marked as required. Patient was prepped and draped in the usual sterile fashion.      Clinical Data: No additional findings.  ROS:  All other systems negative, except as noted in the HPI. Review of Systems  Objective: Vital Signs: Ht 5\' 4"  (1.626 m)   Wt 160 lb (72.6 kg)   BMI 27.46 kg/m   Specialty Comments:  No specialty comments available.  PMFS History: Patient Active Problem List   Diagnosis Date Noted  . Cutaneous abscess of left foot   . Acute respiratory failure with hypoxia ([place])   . Cellulitis 01/02/2018  . LGI bleed   . Acute blood loss anemia   . Wide-complex tachycardia ([place]) 07/04/2017  . Type II diabetes mellitus, uncontrolled ([place]) 07/04/2017  . Peripheral neuropathy 07/04/2017  . H/O hyperthyroidism 07/04/2017  . S/P transmetatarsal amputation of foot, right ([place]) 04/19/2016  . Obstructive sleep apnea 11/26/2015  . Bilateral carpal tunnel syndrome 11/26/2015  . Hypomagnesemia 11/16/2015  . Hyperlipidemia LDL goal <70 10/13/2015  . Abnormality of gait 09/02/2015  . Memory loss 08/12/2015  . Diabetic peripheral neuropathy ([place]) 08/12/2015  . Vitamin D deficiency 08/12/2015  . Hyperthyroidism 04/30/2015  . Heme positive stool   . Persistent atrial fibrillation 04/29/2015  . Coronary artery disease with stable angina pectoris ([place]) 03/29/2015  . Abnormal nuclear stress test   . History of goiter 09/28/2014  . GERD (gastroesophageal reflux disease) 07/31/2013  . Depression with anxiety 06/01/2013  . DM2 (diabetes mellitus, type 2) ([place]) 01/30/2013  . HTN (hypertension) 01/30/2013   Past Medical History:  Diagnosis Date  . Abnormal EKG 07/31/2013  . Arthritis    "hands" (03/06/2015)  . Asthma   . Carpal tunnel syndrome, bilateral   . Chronic kidney disease   . Complication of anesthesia    slow to wake up  . Coronary artery disease     2 v CAD with CTO of the RCA  and high grade bifurcational LCx/OM stenosis. S/P PCI DES x 2 to the LCx/OM.  Marland Kitchen Diabetic peripheral neuropathy ([place]) "since 1996"  . GERD (gastroesophageal reflux disease)   . Goiter   . Headache    migraines prior to menopause  . History of shingles 06/01/2013  . Hyperlipidemia LDL goal <70 10/13/2015  . Hypertension   . Hyperthyroidism   . PAF (paroxysmal atrial fibrillation) ([place]) 04/29/2015   CHADS2VASC score of 5 now on Apixaban  . Pneumonia ~ 1976  . Tremors of nervous system   . Type II diabetes mellitus (HCC)    insulin dependent    Family History  Problem Relation Age of Onset  . Cancer Mother 6       bronchial cancer  . Breast cancer Mother   . Lung cancer Mother   . Hypertension Father   . COPD Father   .  Heart disease Father 58       CAD with cardiac stenting  . Heart attack Father   . Parkinson's disease Father   . Allergies Sister   . Breast cancer Maternal Grandmother   . Emphysema Maternal Grandfather   . Leukemia Paternal Grandmother   . Emphysema Paternal Grandfather   . Thyroid disease Neg Hx     Past Surgical History:  Procedure Laterality Date  . ABDOMINAL HYSTERECTOMY  1988   age 7; CERVICAL DYSPLASIA; ovaries intact.   . AMPUTATION Right 01/23/2016   Procedure: Right 3rd Ray Amputation;  Surgeon: [name], MD;  Location: [facility];  Service: Orthopedics;  Laterality: Right;  . AMPUTATION Right 02/13/2016   Procedure: Right Transmetatarsal Amputation;  Surgeon: [name], MD;  Location: [facility];  Service: Orthopedics;  Laterality: Right;  . CARDIAC CATHETERIZATION N/A 02/27/2015   Procedure: Left Heart Cath and Coronary Angiography;  Surgeon: [name], MD; LAD 40%, mCFX 80%, OM 70%, RCA 100% calcified       . CARDIAC CATHETERIZATION N/A 03/06/2015   Procedure: Coronary Stent Intervention;  Surgeon: [name], MD;  Location: [facility];  Service: Cardiovascular;  Laterality: N/A;  Mid CX 3.50x12 promus DES w/ 0% resdual and Prox  OM1 2.50x20 promus DES w/ 20% residual  . CARDIOVERSION    . CARPAL TUNNEL RELEASE Right Nov 2015  . CARPAL TUNNEL RELEASE Right 1992; 05/2014   [name]; [place]  . CESAREAN SECTION  1982; 1984  . FOOT NEUROMA SURGERY Bilateral 2000  . I&D EXTREMITY Left 01/06/2018   Procedure: DEBRIDEMENT ULCER LEFT FOOT;  Surgeon: [name], MD;  Location: [facility];  Service: Orthopedics;  Laterality: Left;  . KNEE ARTHROSCOPY Right ~ 2003   "meniscus repair"  . SHOULDER OPEN ROTATOR CUFF REPAIR Right 1996; 1998   "w/fracture repair"  . THYROID SURGERY  2000   "removed lots of nodules"  . TONSILLECTOMY  1976   Social History   Occupational History  . Occupation: Landscape architect  Tobacco Use  . Smoking status: Former Smoker    Packs/day: 0.00    Years: 41.00    Pack years: 0.00    Types: Cigarettes    Last attempt to quit: 03/05/2015    Years since quitting: 2.9  . Smokeless tobacco: Never Used  . Tobacco comment: 04/29/2015 "quit smoking cigarettes 02/27/2015"  Substance and Sexual Activity  . Alcohol use: No  . Drug use: No  . Sexual activity: Not Currently    Birth control/protection: Post-menopausal, Surgical

## 2018-02-27 DIAGNOSIS — N183 Chronic kidney disease, stage 3 (moderate): Secondary | ICD-10-CM | POA: Diagnosis not present

## 2018-02-27 DIAGNOSIS — D631 Anemia in chronic kidney disease: Secondary | ICD-10-CM | POA: Diagnosis not present

## 2018-02-27 DIAGNOSIS — I5032 Chronic diastolic (congestive) heart failure: Secondary | ICD-10-CM | POA: Diagnosis not present

## 2018-02-27 DIAGNOSIS — I130 Hypertensive heart and chronic kidney disease with heart failure and stage 1 through stage 4 chronic kidney disease, or unspecified chronic kidney disease: Secondary | ICD-10-CM | POA: Diagnosis not present

## 2018-02-27 DIAGNOSIS — J449 Chronic obstructive pulmonary disease, unspecified: Secondary | ICD-10-CM | POA: Diagnosis not present

## 2018-02-27 DIAGNOSIS — L02416 Cutaneous abscess of left lower limb: Secondary | ICD-10-CM | POA: Diagnosis not present

## 2018-02-27 DIAGNOSIS — A409 Streptococcal sepsis, unspecified: Secondary | ICD-10-CM | POA: Diagnosis not present

## 2018-02-27 DIAGNOSIS — E1122 Type 2 diabetes mellitus with diabetic chronic kidney disease: Secondary | ICD-10-CM | POA: Diagnosis not present

## 2018-02-27 DIAGNOSIS — L03116 Cellulitis of left lower limb: Secondary | ICD-10-CM | POA: Diagnosis not present

## 2018-02-28 ENCOUNTER — Ambulatory Visit: Payer: Self-pay | Admitting: Nurse Practitioner

## 2018-02-28 DIAGNOSIS — I130 Hypertensive heart and chronic kidney disease with heart failure and stage 1 through stage 4 chronic kidney disease, or unspecified chronic kidney disease: Secondary | ICD-10-CM | POA: Diagnosis not present

## 2018-02-28 DIAGNOSIS — A409 Streptococcal sepsis, unspecified: Secondary | ICD-10-CM | POA: Diagnosis not present

## 2018-02-28 DIAGNOSIS — I5032 Chronic diastolic (congestive) heart failure: Secondary | ICD-10-CM | POA: Diagnosis not present

## 2018-02-28 DIAGNOSIS — L02416 Cutaneous abscess of left lower limb: Secondary | ICD-10-CM | POA: Diagnosis not present

## 2018-02-28 DIAGNOSIS — L03116 Cellulitis of left lower limb: Secondary | ICD-10-CM | POA: Diagnosis not present

## 2018-02-28 DIAGNOSIS — N183 Chronic kidney disease, stage 3 (moderate): Secondary | ICD-10-CM | POA: Diagnosis not present

## 2018-02-28 DIAGNOSIS — J449 Chronic obstructive pulmonary disease, unspecified: Secondary | ICD-10-CM | POA: Diagnosis not present

## 2018-02-28 DIAGNOSIS — D631 Anemia in chronic kidney disease: Secondary | ICD-10-CM | POA: Diagnosis not present

## 2018-02-28 DIAGNOSIS — E1122 Type 2 diabetes mellitus with diabetic chronic kidney disease: Secondary | ICD-10-CM | POA: Diagnosis not present

## 2018-03-01 DIAGNOSIS — J449 Chronic obstructive pulmonary disease, unspecified: Secondary | ICD-10-CM | POA: Diagnosis not present

## 2018-03-01 DIAGNOSIS — E1122 Type 2 diabetes mellitus with diabetic chronic kidney disease: Secondary | ICD-10-CM | POA: Diagnosis not present

## 2018-03-01 DIAGNOSIS — N183 Chronic kidney disease, stage 3 (moderate): Secondary | ICD-10-CM | POA: Diagnosis not present

## 2018-03-01 DIAGNOSIS — I5032 Chronic diastolic (congestive) heart failure: Secondary | ICD-10-CM | POA: Diagnosis not present

## 2018-03-01 DIAGNOSIS — D631 Anemia in chronic kidney disease: Secondary | ICD-10-CM | POA: Diagnosis not present

## 2018-03-01 DIAGNOSIS — A409 Streptococcal sepsis, unspecified: Secondary | ICD-10-CM | POA: Diagnosis not present

## 2018-03-01 DIAGNOSIS — L03116 Cellulitis of left lower limb: Secondary | ICD-10-CM | POA: Diagnosis not present

## 2018-03-01 DIAGNOSIS — I130 Hypertensive heart and chronic kidney disease with heart failure and stage 1 through stage 4 chronic kidney disease, or unspecified chronic kidney disease: Secondary | ICD-10-CM | POA: Diagnosis not present

## 2018-03-01 DIAGNOSIS — L02416 Cutaneous abscess of left lower limb: Secondary | ICD-10-CM | POA: Diagnosis not present

## 2018-03-02 DIAGNOSIS — D631 Anemia in chronic kidney disease: Secondary | ICD-10-CM | POA: Diagnosis not present

## 2018-03-02 DIAGNOSIS — I5032 Chronic diastolic (congestive) heart failure: Secondary | ICD-10-CM | POA: Diagnosis not present

## 2018-03-02 DIAGNOSIS — L02416 Cutaneous abscess of left lower limb: Secondary | ICD-10-CM | POA: Diagnosis not present

## 2018-03-02 DIAGNOSIS — L03116 Cellulitis of left lower limb: Secondary | ICD-10-CM | POA: Diagnosis not present

## 2018-03-02 DIAGNOSIS — E1122 Type 2 diabetes mellitus with diabetic chronic kidney disease: Secondary | ICD-10-CM | POA: Diagnosis not present

## 2018-03-02 DIAGNOSIS — J449 Chronic obstructive pulmonary disease, unspecified: Secondary | ICD-10-CM | POA: Diagnosis not present

## 2018-03-02 DIAGNOSIS — I130 Hypertensive heart and chronic kidney disease with heart failure and stage 1 through stage 4 chronic kidney disease, or unspecified chronic kidney disease: Secondary | ICD-10-CM | POA: Diagnosis not present

## 2018-03-02 DIAGNOSIS — N183 Chronic kidney disease, stage 3 (moderate): Secondary | ICD-10-CM | POA: Diagnosis not present

## 2018-03-02 DIAGNOSIS — A409 Streptococcal sepsis, unspecified: Secondary | ICD-10-CM | POA: Diagnosis not present

## 2018-03-03 DIAGNOSIS — G4733 Obstructive sleep apnea (adult) (pediatric): Secondary | ICD-10-CM | POA: Diagnosis not present

## 2018-03-06 ENCOUNTER — Encounter (INDEPENDENT_AMBULATORY_CARE_PROVIDER_SITE_OTHER): Payer: Self-pay | Admitting: Orthopedic Surgery

## 2018-03-06 ENCOUNTER — Ambulatory Visit (INDEPENDENT_AMBULATORY_CARE_PROVIDER_SITE_OTHER): Payer: Medicare PPO | Admitting: Orthopedic Surgery

## 2018-03-06 VITALS — Ht 64.0 in | Wt 160.0 lb

## 2018-03-06 DIAGNOSIS — A409 Streptococcal sepsis, unspecified: Secondary | ICD-10-CM | POA: Diagnosis not present

## 2018-03-06 DIAGNOSIS — L02416 Cutaneous abscess of left lower limb: Secondary | ICD-10-CM | POA: Diagnosis not present

## 2018-03-06 DIAGNOSIS — J449 Chronic obstructive pulmonary disease, unspecified: Secondary | ICD-10-CM | POA: Diagnosis not present

## 2018-03-06 DIAGNOSIS — D631 Anemia in chronic kidney disease: Secondary | ICD-10-CM | POA: Diagnosis not present

## 2018-03-06 DIAGNOSIS — N183 Chronic kidney disease, stage 3 (moderate): Secondary | ICD-10-CM | POA: Diagnosis not present

## 2018-03-06 DIAGNOSIS — I130 Hypertensive heart and chronic kidney disease with heart failure and stage 1 through stage 4 chronic kidney disease, or unspecified chronic kidney disease: Secondary | ICD-10-CM | POA: Diagnosis not present

## 2018-03-06 DIAGNOSIS — E1122 Type 2 diabetes mellitus with diabetic chronic kidney disease: Secondary | ICD-10-CM | POA: Diagnosis not present

## 2018-03-06 DIAGNOSIS — L03116 Cellulitis of left lower limb: Secondary | ICD-10-CM | POA: Diagnosis not present

## 2018-03-06 DIAGNOSIS — M7541 Impingement syndrome of right shoulder: Secondary | ICD-10-CM

## 2018-03-06 DIAGNOSIS — I5032 Chronic diastolic (congestive) heart failure: Secondary | ICD-10-CM | POA: Diagnosis not present

## 2018-03-06 NOTE — Progress Notes (Signed)
Office Visit Note   Patient: Judith [name]           Date of Birth: 08/29/1950           MRN: [id number] Visit Date: 01-Feb-202019              Requested by: Rutherford Guys, MD [address] [place], [place] [address] PCP: Rutherford Guys, MD  Chief Complaint  Patient presents with  . Right Shoulder - Follow-up    Right Shoulder F/U      HPI: Patient is a 67 year old woman who presents complaining of worsening right shoulder pain.  She states that she has had 2 previous open surgeries in the past she states that her recent fall exacerbated her shoulder problem and she states her shoulder pain is worse after the recent subacromial injection.  Patient states that her left foot is doing much better.  Assessment & Plan: Visit Diagnoses:  1. Impingement syndrome of right shoulder     Plan: Discussed options for the right shoulder including arthroscopic debridement and decompression.  Patient states she would like to proceed with surgical intervention.  Discussed that as best we should be able to improve her symptoms by about 75%.  Risk and benefits were discussed.  We will call her to set up surgery.  Follow-Up Instructions: Return in about 2 weeks (around 03/20/2018).   Ortho Exam  Patient is alert, oriented, no adenopathy, well-dressed, normal affect, normal respiratory effort. Examination patient uses a rolling walker for ambulation.  She uses her left hand to elevate her right hand.  She has abduction flexion to 70 degrees she has pain with Neer and Hawkins impingement test pain with palpation over the biceps tendon.  Imaging: No results found. No images are attached to the encounter.  Labs: Lab Results  Component Value Date   HGBA1C 7.7 (A) 01/30/2018   HGBA1C 8.7 (A) 12/05/2017   HGBA1C 7.9 (H) 08/06/2017   ESRSEDRATE 133 (H) 01/02/2018   CRP 23.5 (H) 01/02/2018   REPTSTATUS 01/12/2018 FINAL 01/06/2018   GRAMSTAIN  01/06/2018    MODERATE WBC PRESENT, PREDOMINANTLY  PMN RARE GRAM POSITIVE COCCI    CULT  01/06/2018    RARE STREPTOCOCCUS MITIS/ORALIS RARE STAPHYLOCOCCUS SPECIES (COAGULASE NEGATIVE) NO ANAEROBES ISOLATED Performed at [facility], [place] [address] [place], [place] [address]    LABORGA STREPTOCOCCUS MITIS/ORALIS 01/06/2018     Lab Results  Component Value Date   ALBUMIN 3.5 02/05/2018   ALBUMIN 2.4 (L) 01/05/2018   ALBUMIN 4.2 09/19/2017    Body mass index is 27.46 kg/m.  Orders:  No orders of the defined types were placed in this encounter.  No orders of the defined types were placed in this encounter.    Procedures: No procedures performed  Clinical Data: No additional findings.  ROS:  All other systems negative, except as noted in the HPI. Review of Systems  Objective: Vital Signs: Ht 5\' 4"  (1.626 m)   Wt 160 lb (72.6 kg)   BMI 27.46 kg/m   Specialty Comments:  No specialty comments available.  PMFS History: Patient Active Problem List   Diagnosis Date Noted  . Cutaneous abscess of left foot   . Acute respiratory failure with hypoxia ([place])   . Cellulitis 01/02/2018  . LGI bleed   . Acute blood loss anemia   . Wide-complex tachycardia ([place]) 07/04/2017  . Type II diabetes mellitus, uncontrolled ([place]) 07/04/2017  . Peripheral neuropathy 07/04/2017  . H/O hyperthyroidism 07/04/2017  .  S/P transmetatarsal amputation of foot, right ([place]) 04/19/2016  . Obstructive sleep apnea 11/26/2015  . Bilateral carpal tunnel syndrome 11/26/2015  . Hypomagnesemia 11/16/2015  . Hyperlipidemia LDL goal <70 10/13/2015  . Abnormality of gait 09/02/2015  . Memory loss 08/12/2015  . Diabetic peripheral neuropathy ([place]) 08/12/2015  . Vitamin D deficiency 08/12/2015  . Hyperthyroidism 04/30/2015  . Heme positive stool   . Persistent atrial fibrillation 04/29/2015  . Coronary artery disease with stable angina pectoris ([place]) 03/29/2015  . Abnormal nuclear stress test   . History of goiter 09/28/2014  . GERD  (gastroesophageal reflux disease) 07/31/2013  . Depression with anxiety 06/01/2013  . DM2 (diabetes mellitus, type 2) ([place]) 01/30/2013  . HTN (hypertension) 01/30/2013   Past Medical History:  Diagnosis Date  . Abnormal EKG 07/31/2013  . Arthritis    "hands" (03/06/2015)  . Asthma   . Carpal tunnel syndrome, bilateral   . Chronic kidney disease   . Complication of anesthesia    slow to wake up  . Coronary artery disease     2 v CAD with CTO of the RCA and high grade bifurcational LCx/OM stenosis. S/P PCI DES x 2 to the LCx/OM.  Marland Kitchen Diabetic peripheral neuropathy ([place]) "since 1996"  . GERD (gastroesophageal reflux disease)   . Goiter   . Headache    migraines prior to menopause  . History of shingles 06/01/2013  . Hyperlipidemia LDL goal <70 10/13/2015  . Hypertension   . Hyperthyroidism   . PAF (paroxysmal atrial fibrillation) ([place]) 04/29/2015   CHADS2VASC score of 5 now on Apixaban  . Pneumonia ~ 1976  . Tremors of nervous system   . Type II diabetes mellitus (HCC)    insulin dependent    Family History  Problem Relation Age of Onset  . Cancer Mother 64       bronchial cancer  . Breast cancer Mother   . Lung cancer Mother   . Hypertension Father   . COPD Father   . Heart disease Father 48       CAD with cardiac stenting  . Heart attack Father   . Parkinson's disease Father   . Allergies Sister   . Breast cancer Maternal Grandmother   . Emphysema Maternal Grandfather   . Leukemia Paternal Grandmother   . Emphysema Paternal Grandfather   . Thyroid disease Neg Hx     Past Surgical History:  Procedure Laterality Date  . ABDOMINAL HYSTERECTOMY  1988   age 66; CERVICAL DYSPLASIA; ovaries intact.   . AMPUTATION Right 01/23/2016   Procedure: Right 3rd Ray Amputation;  Surgeon: [name], MD;  Location: [facility];  Service: Orthopedics;  Laterality: Right;  . AMPUTATION Right 02/13/2016   Procedure: Right Transmetatarsal Amputation;  Surgeon: [name], MD;  Location:  [facility];  Service: Orthopedics;  Laterality: Right;  . CARDIAC CATHETERIZATION N/A 02/27/2015   Procedure: Left Heart Cath and Coronary Angiography;  Surgeon: [name], MD; LAD 40%, mCFX 80%, OM 70%, RCA 100% calcified       . CARDIAC CATHETERIZATION N/A 03/06/2015   Procedure: Coronary Stent Intervention;  Surgeon: [name], MD;  Location: [facility];  Service: Cardiovascular;  Laterality: N/A;  Mid CX 3.50x12 promus DES w/ 0% resdual and Prox OM1 2.50x20 promus DES w/ 20% residual  . CARDIOVERSION    . CARPAL TUNNEL RELEASE Right Nov 2015  . CARPAL TUNNEL RELEASE Right 1992; 05/2014   [name]; [place]  . CESAREAN SECTION  1982; 1984  . FOOT NEUROMA SURGERY Bilateral 2000  . I&D EXTREMITY Left 01/06/2018   Procedure: DEBRIDEMENT ULCER LEFT FOOT;  Surgeon: [name], MD;  Location: [facility];  Service: Orthopedics;  Laterality: Left;  . KNEE ARTHROSCOPY Right ~ 2003   "meniscus repair"  . SHOULDER OPEN ROTATOR CUFF REPAIR Right 1996; 1998   "w/fracture repair"  . THYROID SURGERY  2000   "removed lots of nodules"  . TONSILLECTOMY  1976   Social History   Occupational History  . Occupation: Landscape architect  Tobacco Use  . Smoking status: Former Smoker    Packs/day: 0.00    Years: 41.00    Pack years: 0.00    Types: Cigarettes    Last attempt to quit: 03/05/2015    Years since quitting: 3.0  . Smokeless tobacco: Never Used  . Tobacco comment: 04/29/2015 "quit smoking cigarettes 02/27/2015"  Substance and Sexual Activity  . Alcohol use: No  . Drug use: No  . Sexual activity: Not Currently    Birth control/protection: Post-menopausal, Surgical

## 2018-03-08 DIAGNOSIS — I130 Hypertensive heart and chronic kidney disease with heart failure and stage 1 through stage 4 chronic kidney disease, or unspecified chronic kidney disease: Secondary | ICD-10-CM | POA: Diagnosis not present

## 2018-03-08 DIAGNOSIS — D631 Anemia in chronic kidney disease: Secondary | ICD-10-CM | POA: Diagnosis not present

## 2018-03-08 DIAGNOSIS — J449 Chronic obstructive pulmonary disease, unspecified: Secondary | ICD-10-CM | POA: Diagnosis not present

## 2018-03-08 DIAGNOSIS — E1122 Type 2 diabetes mellitus with diabetic chronic kidney disease: Secondary | ICD-10-CM | POA: Diagnosis not present

## 2018-03-08 DIAGNOSIS — L03116 Cellulitis of left lower limb: Secondary | ICD-10-CM | POA: Diagnosis not present

## 2018-03-08 DIAGNOSIS — A409 Streptococcal sepsis, unspecified: Secondary | ICD-10-CM | POA: Diagnosis not present

## 2018-03-08 DIAGNOSIS — N183 Chronic kidney disease, stage 3 (moderate): Secondary | ICD-10-CM | POA: Diagnosis not present

## 2018-03-08 DIAGNOSIS — I5032 Chronic diastolic (congestive) heart failure: Secondary | ICD-10-CM | POA: Diagnosis not present

## 2018-03-08 DIAGNOSIS — L02416 Cutaneous abscess of left lower limb: Secondary | ICD-10-CM | POA: Diagnosis not present

## 2018-03-10 DIAGNOSIS — I130 Hypertensive heart and chronic kidney disease with heart failure and stage 1 through stage 4 chronic kidney disease, or unspecified chronic kidney disease: Secondary | ICD-10-CM | POA: Diagnosis not present

## 2018-03-10 DIAGNOSIS — I5032 Chronic diastolic (congestive) heart failure: Secondary | ICD-10-CM | POA: Diagnosis not present

## 2018-03-10 DIAGNOSIS — J449 Chronic obstructive pulmonary disease, unspecified: Secondary | ICD-10-CM | POA: Diagnosis not present

## 2018-03-10 DIAGNOSIS — E1122 Type 2 diabetes mellitus with diabetic chronic kidney disease: Secondary | ICD-10-CM | POA: Diagnosis not present

## 2018-03-10 DIAGNOSIS — L03116 Cellulitis of left lower limb: Secondary | ICD-10-CM | POA: Diagnosis not present

## 2018-03-10 DIAGNOSIS — L02416 Cutaneous abscess of left lower limb: Secondary | ICD-10-CM | POA: Diagnosis not present

## 2018-03-10 DIAGNOSIS — D631 Anemia in chronic kidney disease: Secondary | ICD-10-CM | POA: Diagnosis not present

## 2018-03-10 DIAGNOSIS — N183 Chronic kidney disease, stage 3 (moderate): Secondary | ICD-10-CM | POA: Diagnosis not present

## 2018-03-10 DIAGNOSIS — A409 Streptococcal sepsis, unspecified: Secondary | ICD-10-CM | POA: Diagnosis not present

## 2018-03-14 ENCOUNTER — Encounter (HOSPITAL_COMMUNITY): Payer: Self-pay | Admitting: Nurse Practitioner

## 2018-03-14 ENCOUNTER — Ambulatory Visit (HOSPITAL_COMMUNITY)
Admission: RE | Admit: 2018-03-14 | Discharge: 2018-03-14 | Disposition: A | Discharge status: Home / Self Care | Payer: Medicare PPO | Source: Ambulatory Visit | Attending: Nurse Practitioner | Admitting: Nurse Practitioner

## 2018-03-14 VITALS — BP 118/72 | HR 68 | Ht 64.0 in | Wt 166.0 lb

## 2018-03-14 DIAGNOSIS — Z7901 Long term (current) use of anticoagulants: Secondary | ICD-10-CM | POA: Diagnosis not present

## 2018-03-14 DIAGNOSIS — E1142 Type 2 diabetes mellitus with diabetic polyneuropathy: Secondary | ICD-10-CM | POA: Insufficient documentation

## 2018-03-14 DIAGNOSIS — I480 Paroxysmal atrial fibrillation: Secondary | ICD-10-CM

## 2018-03-14 DIAGNOSIS — Z87891 Personal history of nicotine dependence: Secondary | ICD-10-CM | POA: Diagnosis not present

## 2018-03-14 DIAGNOSIS — N189 Chronic kidney disease, unspecified: Secondary | ICD-10-CM | POA: Diagnosis not present

## 2018-03-14 DIAGNOSIS — E0590 Thyrotoxicosis, unspecified without thyrotoxic crisis or storm: Secondary | ICD-10-CM | POA: Insufficient documentation

## 2018-03-14 DIAGNOSIS — Z880 Allergy status to penicillin: Secondary | ICD-10-CM | POA: Diagnosis not present

## 2018-03-14 DIAGNOSIS — E1122 Type 2 diabetes mellitus with diabetic chronic kidney disease: Secondary | ICD-10-CM | POA: Insufficient documentation

## 2018-03-14 DIAGNOSIS — J449 Chronic obstructive pulmonary disease, unspecified: Secondary | ICD-10-CM | POA: Diagnosis not present

## 2018-03-14 DIAGNOSIS — L02416 Cutaneous abscess of left lower limb: Secondary | ICD-10-CM | POA: Diagnosis not present

## 2018-03-14 DIAGNOSIS — I5032 Chronic diastolic (congestive) heart failure: Secondary | ICD-10-CM | POA: Diagnosis not present

## 2018-03-14 DIAGNOSIS — Z79899 Other long term (current) drug therapy: Secondary | ICD-10-CM | POA: Insufficient documentation

## 2018-03-14 DIAGNOSIS — J45909 Unspecified asthma, uncomplicated: Secondary | ICD-10-CM | POA: Insufficient documentation

## 2018-03-14 DIAGNOSIS — A409 Streptococcal sepsis, unspecified: Secondary | ICD-10-CM | POA: Diagnosis not present

## 2018-03-14 DIAGNOSIS — Z955 Presence of coronary angioplasty implant and graft: Secondary | ICD-10-CM | POA: Insufficient documentation

## 2018-03-14 DIAGNOSIS — I130 Hypertensive heart and chronic kidney disease with heart failure and stage 1 through stage 4 chronic kidney disease, or unspecified chronic kidney disease: Secondary | ICD-10-CM | POA: Diagnosis not present

## 2018-03-14 DIAGNOSIS — E785 Hyperlipidemia, unspecified: Secondary | ICD-10-CM | POA: Insufficient documentation

## 2018-03-14 DIAGNOSIS — K219 Gastro-esophageal reflux disease without esophagitis: Secondary | ICD-10-CM | POA: Diagnosis not present

## 2018-03-14 DIAGNOSIS — Z885 Allergy status to narcotic agent status: Secondary | ICD-10-CM | POA: Insufficient documentation

## 2018-03-14 DIAGNOSIS — I2510 Atherosclerotic heart disease of native coronary artery without angina pectoris: Secondary | ICD-10-CM | POA: Diagnosis not present

## 2018-03-14 DIAGNOSIS — G4730 Sleep apnea, unspecified: Secondary | ICD-10-CM | POA: Diagnosis not present

## 2018-03-14 DIAGNOSIS — Z794 Long term (current) use of insulin: Secondary | ICD-10-CM | POA: Insufficient documentation

## 2018-03-14 DIAGNOSIS — L03116 Cellulitis of left lower limb: Secondary | ICD-10-CM | POA: Diagnosis not present

## 2018-03-14 DIAGNOSIS — I129 Hypertensive chronic kidney disease with stage 1 through stage 4 chronic kidney disease, or unspecified chronic kidney disease: Secondary | ICD-10-CM | POA: Diagnosis not present

## 2018-03-14 DIAGNOSIS — G5603 Carpal tunnel syndrome, bilateral upper limbs: Secondary | ICD-10-CM | POA: Diagnosis not present

## 2018-03-14 DIAGNOSIS — Z888 Allergy status to other drugs, medicaments and biological substances status: Secondary | ICD-10-CM | POA: Insufficient documentation

## 2018-03-14 DIAGNOSIS — D631 Anemia in chronic kidney disease: Secondary | ICD-10-CM | POA: Diagnosis not present

## 2018-03-14 DIAGNOSIS — N183 Chronic kidney disease, stage 3 (moderate): Secondary | ICD-10-CM | POA: Diagnosis not present

## 2018-03-14 LAB — BASIC METABOLIC PANEL
ANION GAP: 5 (ref 5–15)
BUN: 22 mg/dL (ref 8–23)
CALCIUM: 8.4 mg/dL — AB (ref 8.9–10.3)
CO2: 30 mmol/L (ref 22–32)
CREATININE: 1.14 mg/dL — AB (ref 0.44–1.00)
Chloride: 103 mmol/L (ref 98–111)
GFR calc Af Amer: 57 mL/min — ABNORMAL LOW (ref >60)
GFR, EST NON AFRICAN AMERICAN: 49 mL/min — AB (ref >60)
GLUCOSE: 145 mg/dL — AB (ref 70–99)
Potassium: 4.2 mmol/L (ref 3.5–5.1)
Sodium: 138 mmol/L (ref 135–145)

## 2018-03-14 LAB — MAGNESIUM: Magnesium: 0.9 mg/dL — CL (ref 1.7–2.4)

## 2018-03-14 NOTE — Progress Notes (Signed)
Primary Care Physician: Rutherford Guys, MD Referring Physician:  [facility] ER Cardiologist: Dr. Krystal Eaton Judith [name] is a 67 y.o. female with a h/o HTN, ASCAD with CTO of the RCA and high grade LCx/OM stenosis s/p DES x 2 (10/16), hyperlipidemia, Type II DM,hyperthyroidism onmethimazole, atrial fibrillations/p multiple DCCV in the past. She was loaded on sotalol around one year ago per pt and ad  2 ER visits this past summer for breakthrough afib. She spontaneously converted. She reports that she was recently found to have severe sleep apnea and now has started cpap.Judith [name] She was referred back to Dr. Rayann Heman for options other than sotalol which did not seem to be controlling her afib that well. However, when she saw him 3 months ago, she had started cpap  and her afib burden was low. She chose to continue sotalol. She has had 3 episodes in the last 3 months. The longest one was 3 hours long for which she took extra metoprolol. She also has difficulty maintaining mag/K+ levels and is on mag/K+ supplementation. Qtc is stable today.  Today, she denies symptoms of palpitations, chest pain, shortness of breath, orthopnea, PND, lower extremity edema, dizziness, presyncope, syncope, or neurologic sequela. The patient is tolerating medications without difficulties and is otherwise without complaint today.   Past Medical History:  Diagnosis Date  . Abnormal EKG 07/31/2013  . Arthritis    "hands" (03/06/2015)  . Asthma   . Carpal tunnel syndrome, bilateral   . Chronic kidney disease   . Complication of anesthesia    slow to wake up  . Coronary artery disease     2 v CAD with CTO of the RCA and high grade bifurcational LCx/OM stenosis. S/P PCI DES x 2 to the LCx/OM.  Judith [name] Diabetic peripheral neuropathy ([place]) "since 1996"  . GERD (gastroesophageal reflux disease)   . Goiter   . Headache    migraines prior to menopause  . History of shingles 06/01/2013  . Hyperlipidemia LDL goal <70 10/13/2015  .  Hypertension   . Hyperthyroidism   . PAF (paroxysmal atrial fibrillation) ([place]) 04/29/2015   CHADS2VASC score of 5 now on Apixaban  . Pneumonia ~ 1976  . Tremors of nervous system   . Type II diabetes mellitus (HCC)    insulin dependent   Past Surgical History:  Procedure Laterality Date  . ABDOMINAL HYSTERECTOMY  1988   age 28; CERVICAL DYSPLASIA; ovaries intact.   . AMPUTATION Right 01/23/2016   Procedure: Right 3rd Ray Amputation;  Surgeon: [name], MD;  Location: [facility];  Service: Orthopedics;  Laterality: Right;  . AMPUTATION Right 02/13/2016   Procedure: Right Transmetatarsal Amputation;  Surgeon: [name], MD;  Location: [facility];  Service: Orthopedics;  Laterality: Right;  . CARDIAC CATHETERIZATION N/A 02/27/2015   Procedure: Left Heart Cath and Coronary Angiography;  Surgeon: [name], MD; LAD 40%, mCFX 80%, OM 70%, RCA 100% calcified       . CARDIAC CATHETERIZATION N/A 03/06/2015   Procedure: Coronary Stent Intervention;  Surgeon: [name], MD;  Location: [facility];  Service: Cardiovascular;  Laterality: N/A;  Mid CX 3.50x12 promus DES w/ 0% resdual and Prox OM1 2.50x20 promus DES w/ 20% residual  . CARDIOVERSION    . CARPAL TUNNEL RELEASE Right Nov 2015  . CARPAL TUNNEL RELEASE Right 1992; 05/2014   [name]; [place]  . CESAREAN SECTION  1982; 1984  . FOOT NEUROMA SURGERY Bilateral 2000  . I&D EXTREMITY Left  01/06/2018   Procedure: DEBRIDEMENT ULCER LEFT FOOT;  Surgeon: [name], MD;  Location: [facility];  Service: Orthopedics;  Laterality: Left;  . KNEE ARTHROSCOPY Right ~ 2003   "meniscus repair"  . SHOULDER OPEN ROTATOR CUFF REPAIR Right 1996; 1998   "w/fracture repair"  . THYROID SURGERY  2000   "removed lots of nodules"  . TONSILLECTOMY  1976    Current Outpatient Medications  Medication Sig Dispense Refill  . albuterol (PROVENTIL HFA) 108 (90 Base) MCG/ACT inhaler Inhale 1-2 puffs into the lungs every 6 (six) hours as needed for  wheezing or shortness of breath. 1 Inhaler 0  . albuterol (PROVENTIL) (2.5 MG/3ML) 0.083% nebulizer solution Take 3 mLs (2.5 mg total) by nebulization every 6 (six) hours as needed for wheezing or shortness of breath. 150 mL 0  . Alcohol Swabs (B-D SINGLE USE SWABS REGULAR) PADS 1 each by Does not apply route 2 (two) times daily. 100 each 2  . amLODipine (NORVASC) 5 MG tablet Take 1 tablet (5 mg total) by mouth daily. (Patient taking differently: Take 10 mg by mouth daily. ) 30 tablet 0  . apixaban (ELIQUIS) 5 MG TABS tablet Take 1 tablet (5 mg total) by mouth 2 (two) times daily. 180 tablet 3  . atorvastatin (LIPITOR) 20 MG tablet Take 1 tablet (20 mg total) by mouth daily. 90 tablet 3  . Blood Glucose Calibration (TRUE METRIX LEVEL 1) Low SOLN 1 each by In Vitro route as needed. 1 each 0  . Blood Glucose Monitoring Suppl (TRUE METRIX AIR GLUCOSE METER) w/Device KIT 1 each by Does not apply route daily. 1 kit 0  . buPROPion (WELLBUTRIN SR) 150 MG 12 hr tablet Take 150 mg by mouth 2 (two) times daily.    . fenofibrate (TRICOR) 145 MG tablet Take 1 tablet (145 mg total) by mouth daily. 90 tablet 3  . ferrous sulfate 325 (65 FE) MG tablet Take 1 tablet (325 mg total) by mouth 2 (two) times daily with a meal. 30 tablet 3  . fluconazole (DIFLUCAN) 100 MG tablet Take 1 tablet (100 mg total) by mouth daily as needed (yeast infection). 5 tablet 0  . FLUoxetine (PROZAC) 20 MG capsule Take 1 capsule (20 mg total) by mouth at bedtime. 90 capsule 2  . furosemide (LASIX) 20 MG tablet Take 3 tablets (60 mg total) by mouth daily. (Patient taking differently: Take 60 mg by mouth daily as needed. If weight increases by 5 lbs) 273 tablet 3  . gabapentin (NEURONTIN) 300 MG capsule Take 3 capsules (900 mg total) by mouth 2 (two) times daily. TAKE 3 CAPSULES BY MOUTH EVERY  MORNING AND 3 CAPSULES AT BEDTIME (Patient taking differently: Take 600 mg by mouth 2 (two) times daily. ) 540 capsule 0  . glucose blood (TRUE  METRIX BLOOD GLUCOSE TEST) test strip Used to check blood sugars 2x daily. 100 each 12  . insulin degludec (TRESIBA FLEXTOUCH) 100 UNIT/ML SOPN FlexTouch Pen Inject 0.2 mLs (20 Units total) into the skin daily at 10 pm. (Patient taking differently: Inject 15 Units into the skin daily at 10 pm. ) 15 mL 0  . Insulin Pen Needle 32G X 4 MM MISC Use to inject insulin daily 100 each 5  . Insulin Syringe-Needle U-100 (INSULIN SYRINGE .5CC/31GX5/16") 31G X 5/16" 0.5 ML MISC Use to inject insulin 100 each 5  . magnesium oxide (MAG-OX) 400 (241.3 Mg) MG tablet Take 1 tablet (400 mg total) by mouth daily. 30 tablet 6  .  metFORMIN (GLUCOPHAGE-XR) 500 MG 24 hr tablet Take 4 tablets (2,000 mg total) by mouth daily with breakfast. (Patient taking differently: Take 1,000 mg by mouth 2 (two) times daily. ) 360 tablet 3  . methimazole (TAPAZOLE) 10 MG tablet Take 1 tablet (10 mg total) by mouth daily. 30 tablet 11  . metoprolol succinate (TOPROL-XL) 25 MG 24 hr tablet Take 1 tablet (25 mg total) by mouth 2 (two) times daily. (Patient taking differently: Take 25 mg by mouth See admin instructions. Takes 1 tablet twice daily. Then 1 tablet daily as needed for a.fib) 180 tablet 3  . ondansetron (ZOFRAN-ODT) 8 MG disintegrating tablet Take 1 tablet (8 mg total) by mouth every 8 (eight) hours as needed for nausea or vomiting. 20 tablet 1  . pantoprazole (PROTONIX) 40 MG tablet Take 1 tablet (40 mg total) by mouth daily. 90 tablet 0  . polyethylene glycol (MIRALAX / GLYCOLAX) packet Take 17 g by mouth daily as needed. (Patient taking differently: Take 17 g by mouth daily as needed for mild constipation or moderate constipation. ) 14 each 0  . potassium chloride SA (K-DUR,KLOR-CON) 20 MEQ tablet Take 1 tablet (20 mEq total) by mouth daily.    Judith [name] senna (SENOKOT) 8.6 MG TABS tablet Take 1 tablet (8.6 mg total) by mouth at bedtime. 120 each 0  . sotalol (BETAPACE) 120 MG tablet Take 1 tablet (120 mg total) by mouth every 12  (twelve) hours. 180 tablet 3  . traMADol (ULTRAM) 50 MG tablet Take 50 mg by mouth every 8 (eight) hours as needed for moderate pain or severe pain.   5  . triamcinolone cream (KENALOG) 0.1 % Apply 1 application topically 2 (two) times daily. (Patient taking differently: Apply 1 application topically 2 (two) times daily as needed (for rash). ) 30 g 0  . TRUEPLUS LANCETS 28G MISC 1 each by Does not apply route 2 (two) times daily. 100 each 2  . nitroGLYCERIN (NITROSTAT) 0.4 MG SL tablet Place 1 tablet (0.4 mg total) under the tongue every 5 (five) minutes as needed for chest pain. (Patient not taking: Reported on 03/14/2018) 25 tablet 3   No current facility-administered medications for this encounter.     Allergies  Allergen Reactions  . Contrast Media [Iodinated Diagnostic Agents] Hives    Spoke to patient, Iodine allergy is really IV contrast allergy.   . Dilaudid [Hydromorphone Hcl] Other (See Comments)    HEADACHE  . Novolog [Insulin Aspart] Shortness Of Breath    "breathing problems"  . Codeine Nausea And Vomiting    HIGH DOSES-SEVERE VOMITING  . Iodine Other (See Comments)    MUST HAVE BENADRYL PRIOR TO PROCEDURE AND RIGHT BEFORE TREATMENT TO COUNTERACT REACTION-BLISTERING REACTION DERMATOLOGICAL  . Penicillins Itching, Rash and Other (See Comments)    Has patient had a PCN reaction causing immediate rash, facial/tongue/throat swelling, SOB or lightheadedness with hypotension: no Has patient had a PCN reaction causing severe rash involving mucus membranes or skin necrosis: No Has patient had a PCN reaction that required hospitalization No Has patient had a PCN reaction occurring within the last 10 years: No If all of the above answers are "NO", then may proceed with Cephalosporin use.  CHEST SIZED RASH AND ITCHING   . Propofol Other (See Comments)    "Breathing problems - asthma attack" Can take with benadryl   . Ace Inhibitors Cough  . Demerol [Meperidine] Nausea And Vomiting   . Neosporin [Neomycin-Bacitracin Zn-Polymyx] Itching and Rash    MAKES  REACTIONS WORSE WHEN USING AS PROPHYLACTIC  . Percocet [Oxycodone-Acetaminophen] Rash  . Tape Itching and Rash    Social History   Socioeconomic History  . Marital status: Single    Spouse name: Not on file  . Number of children: 2  . Years of education: Masters  . Highest education level: Not on file  Occupational History  . Occupation: Landscape architect  Social Needs  . Financial resource strain: Somewhat hard  . Food insecurity:    Worry: Sometimes true    Inability: Sometimes true  . Transportation needs:    Medical: No    Non-medical: No  Tobacco Use  . Smoking status: Former Smoker    Packs/day: 0.00    Years: 41.00    Pack years: 0.00    Types: Cigarettes    Last attempt to quit: 03/05/2015    Years since quitting: 3.0  . Smokeless tobacco: Never Used  . Tobacco comment: 04/29/2015 "quit smoking cigarettes 02/27/2015"  Substance and Sexual Activity  . Alcohol use: No  . Drug use: No  . Sexual activity: Not Currently    Birth control/protection: Post-menopausal, Surgical  Lifestyle  . Physical activity:    Days per week: 0 days    Minutes per session: 0 min  . Stress: Very much  Relationships  . Social connections:    Talks on phone: More than three times a week    Gets together: More than three times a week    Attends religious service: More than 4 times per year    Active member of club or organization: No    Attends meetings of clubs or organizations: Never    Relationship status: Never married  . Intimate partner violence:    Fear of current or ex partner: No    Emotionally abused: No    Physically abused: No    Forced sexual activity: No  Other Topics Concern  . 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      Lives with sister in [place].      Tobacco: 1 ppd x 41 years - quit 2016      Alcohol: none      Drugs: none      Exercise:  Walking in neighborhood; physical job.   Right-handed.   2 cups caffeine daily.    Family History  Problem Relation Age of Onset  . Cancer Mother 69       bronchial cancer  . Breast cancer Mother   . Lung cancer Mother   . Hypertension Father   . COPD Father   . Heart disease Father 71       CAD with cardiac stenting  . Heart attack Father   . Parkinson's disease Father   . Allergies Sister   . Breast cancer Maternal Grandmother   . Emphysema Maternal Grandfather   . Leukemia Paternal Grandmother   . Emphysema Paternal Grandfather   . Thyroid disease Neg Hx     ROS- All systems are reviewed and negative except as per the HPI above  Physical Exam: Vitals:   03/14/18 0833  BP: 118/72  Pulse: 68  Weight: 75.3 kg  Height: 5' 4" (1.626 m)   Wt Readings from Last 3 Encounters:  03/14/18 75.3 kg  03/06/18 72.6 kg  02/23/18 72.6 kg    Labs: Lab Results  Component Value Date   NA 145 02/05/2018   K 3.7 02/05/2018   CL 105 02/05/2018   CO2 26 02/05/2018   GLUCOSE 136 (H) 02/05/2018   BUN 20 02/05/2018   CREATININE 1.15 (H) 02/05/2018   CALCIUM 8.5 (L) 02/05/2018   PHOS 2.3 (L) 01/05/2018   MG 1.6 (L) 01/05/2018   Lab Results  Component Value Date   INR 1.00 08/05/2017   Lab Results  Component Value Date   CHOL 236 (H) 07/22/2017   HDL 30 (L) 07/22/2017   [place] Comment 07/22/2017   TRIG 544 (H) 07/22/2017     GEN- The patient is well appearing, alert and oriented x 3 today.   Head- normocephalic, atraumatic Eyes-  Sclera clear, conjunctiva pink Ears- hearing intact Oropharynx- clear Neck- supple, no JVP Lymph- no cervical lymphadenopathy Lungs- Clear to ausculation bilaterally, normal work of breathing Heart- Regular rate and rhythm, no murmurs, rubs or  gallops, PMI not laterally displaced GI- soft, NT, ND, + BS Extremities- no clubbing, cyanosis, or edema MS- no significant deformity or atrophy Skin- no rash or lesion Psych- euthymic mood, full affect Neuro- strength and sensation are intact  EKG-NSR at 69 bpm, Pr int 190 ms, qrs int 74 ms, qtc 448 ms Echo-Study Conclusions  - Left ventricle: The cavity size was normal. There was mild   concentric hypertrophy. Systolic function was normal. The   estimated ejection fraction was in the range of 55% to 60%. Wall   motion was normal; there were no regional wall motion   abnormalities. - Aortic valve: Valve mobility was restricted. There was moderate   stenosis. Valve area (VTI): 1.46 cm^2. Valve area (Vmax): 1.42   cm^2. Valve area (Vmean): 1.42 cm^2. - Mitral valve: Calcified annulus. There was mild regurgitation   directed centrally. - Left atrium: The atrium was moderately dilated. 46 mm     Assessment and Plan: 1. Paroxysmal afib Pt has had less  recent breakthrough of afib on sotalol with use of cpap She is happy to continue with current approach  Continue sotalol at 120 mg bid and metoprolol 25 mg bid Bmet/mag today  2. Newly diagnosed severe sleep apnea Now using BiPAP  Afib burden much improved with sleep apnea treated  3. HTN  Stable   F/u in afib clinic in 4 months  Butch Penny C. [facility], [facility] [address] [place], [place] [address] [phone]

## 2018-03-15 DIAGNOSIS — D631 Anemia in chronic kidney disease: Secondary | ICD-10-CM | POA: Diagnosis not present

## 2018-03-15 DIAGNOSIS — I130 Hypertensive heart and chronic kidney disease with heart failure and stage 1 through stage 4 chronic kidney disease, or unspecified chronic kidney disease: Secondary | ICD-10-CM | POA: Diagnosis not present

## 2018-03-15 DIAGNOSIS — L03116 Cellulitis of left lower limb: Secondary | ICD-10-CM | POA: Diagnosis not present

## 2018-03-15 DIAGNOSIS — I5032 Chronic diastolic (congestive) heart failure: Secondary | ICD-10-CM | POA: Diagnosis not present

## 2018-03-15 DIAGNOSIS — J449 Chronic obstructive pulmonary disease, unspecified: Secondary | ICD-10-CM | POA: Diagnosis not present

## 2018-03-15 DIAGNOSIS — E1122 Type 2 diabetes mellitus with diabetic chronic kidney disease: Secondary | ICD-10-CM | POA: Diagnosis not present

## 2018-03-15 DIAGNOSIS — N183 Chronic kidney disease, stage 3 (moderate): Secondary | ICD-10-CM | POA: Diagnosis not present

## 2018-03-15 DIAGNOSIS — A409 Streptococcal sepsis, unspecified: Secondary | ICD-10-CM | POA: Diagnosis not present

## 2018-03-15 DIAGNOSIS — L02416 Cutaneous abscess of left lower limb: Secondary | ICD-10-CM | POA: Diagnosis not present

## 2018-03-16 DIAGNOSIS — L02416 Cutaneous abscess of left lower limb: Secondary | ICD-10-CM | POA: Diagnosis not present

## 2018-03-16 DIAGNOSIS — D631 Anemia in chronic kidney disease: Secondary | ICD-10-CM | POA: Diagnosis not present

## 2018-03-16 DIAGNOSIS — A409 Streptococcal sepsis, unspecified: Secondary | ICD-10-CM | POA: Diagnosis not present

## 2018-03-16 DIAGNOSIS — J449 Chronic obstructive pulmonary disease, unspecified: Secondary | ICD-10-CM | POA: Diagnosis not present

## 2018-03-16 DIAGNOSIS — E1122 Type 2 diabetes mellitus with diabetic chronic kidney disease: Secondary | ICD-10-CM | POA: Diagnosis not present

## 2018-03-16 DIAGNOSIS — N183 Chronic kidney disease, stage 3 (moderate): Secondary | ICD-10-CM | POA: Diagnosis not present

## 2018-03-16 DIAGNOSIS — I130 Hypertensive heart and chronic kidney disease with heart failure and stage 1 through stage 4 chronic kidney disease, or unspecified chronic kidney disease: Secondary | ICD-10-CM | POA: Diagnosis not present

## 2018-03-16 DIAGNOSIS — L03116 Cellulitis of left lower limb: Secondary | ICD-10-CM | POA: Diagnosis not present

## 2018-03-16 DIAGNOSIS — I5032 Chronic diastolic (congestive) heart failure: Secondary | ICD-10-CM | POA: Diagnosis not present

## 2018-03-17 ENCOUNTER — Ambulatory Visit (INDEPENDENT_AMBULATORY_CARE_PROVIDER_SITE_OTHER): Payer: Self-pay | Admitting: Physician Assistant

## 2018-03-17 DIAGNOSIS — G4733 Obstructive sleep apnea (adult) (pediatric): Secondary | ICD-10-CM | POA: Diagnosis not present

## 2018-03-20 ENCOUNTER — Telehealth (INDEPENDENT_AMBULATORY_CARE_PROVIDER_SITE_OTHER): Payer: Self-pay

## 2018-03-20 ENCOUNTER — Other Ambulatory Visit (INDEPENDENT_AMBULATORY_CARE_PROVIDER_SITE_OTHER): Payer: Self-pay | Admitting: Orthopedic Surgery

## 2018-03-20 DIAGNOSIS — M7541 Impingement syndrome of right shoulder: Secondary | ICD-10-CM

## 2018-03-20 DIAGNOSIS — L03116 Cellulitis of left lower limb: Secondary | ICD-10-CM | POA: Diagnosis not present

## 2018-03-20 DIAGNOSIS — I130 Hypertensive heart and chronic kidney disease with heart failure and stage 1 through stage 4 chronic kidney disease, or unspecified chronic kidney disease: Secondary | ICD-10-CM | POA: Diagnosis not present

## 2018-03-20 DIAGNOSIS — D631 Anemia in chronic kidney disease: Secondary | ICD-10-CM | POA: Diagnosis not present

## 2018-03-20 DIAGNOSIS — E1122 Type 2 diabetes mellitus with diabetic chronic kidney disease: Secondary | ICD-10-CM | POA: Diagnosis not present

## 2018-03-20 DIAGNOSIS — A409 Streptococcal sepsis, unspecified: Secondary | ICD-10-CM | POA: Diagnosis not present

## 2018-03-20 DIAGNOSIS — N183 Chronic kidney disease, stage 3 (moderate): Secondary | ICD-10-CM | POA: Diagnosis not present

## 2018-03-20 DIAGNOSIS — I5032 Chronic diastolic (congestive) heart failure: Secondary | ICD-10-CM | POA: Diagnosis not present

## 2018-03-20 DIAGNOSIS — J449 Chronic obstructive pulmonary disease, unspecified: Secondary | ICD-10-CM | POA: Diagnosis not present

## 2018-03-20 DIAGNOSIS — L02416 Cutaneous abscess of left lower limb: Secondary | ICD-10-CM | POA: Diagnosis not present

## 2018-03-20 NOTE — Telephone Encounter (Signed)
Will order MRI scan of her shoulder.

## 2018-03-20 NOTE — Telephone Encounter (Signed)
Has this pt had any imaging for her right shoulder? Did not see anything in the chart. [facility] is requesting it before they can approve surgery.

## 2018-03-21 ENCOUNTER — Encounter (HOSPITAL_COMMUNITY): Payer: Self-pay | Admitting: Physician Assistant

## 2018-03-21 NOTE — Progress Notes (Signed)
Anesthesia Chart Review: SAME DAY WORKUP   Case:  [id number] Date/Time:  03/24/18 1011   Procedure:  RIGHT SHOULDER ARTHROSCOPY, DEBRIDEMENT, AND DECOMPRESSION (Right )   Anesthesia type:  General   Pre-op diagnosis:  Rotator Cuff Tear Right Shoulder   Location:  [facility] OR ROOM 07 / [place] OR   Surgeon:  [name], MD      DISCUSSION: 67 yo female former smoker. Pertinent hx includes  ASCAD with CTO of the RCA and high grade LCx/OM stenosis s/p DES x 2 (10/16), hyperlipidemia, CKD III, IDDMII,hyperthyroidism onmethimazole, atrial fibrillations/p multiple DCCV in the past, OSA on BiPAP, Aortic stenosis (mod by echo 12/2017).  Pt recently admitted to [facility] 8/26-01/11/2018 for sepsis secondary to left leg cellulitis and abscess and acute diastolic CHF. She had acute respiratory failure associated with pulmonary edema requiring BiPAP and high flow nasal cannula.  Patient was given IV Lasix with improvement and weaned down to room air. She underwent I&D of the left leg by Dr. Sharol Given.   She follows with afib clinic for history of paroxysmal afib with RVR. Last seen 03/14/2018 by Roderic Palau, ANP-C. Per her note, pt has had less recent breakthrough of afib on sotalol with use of cpap. In addition to CAD history, she also has moderate aortic stenosis being monitored Dr. Radford Pax. Most recent echo 01/05/18 showed normal LV size with mild LV hypertrophy. EF 55-60% with possible mild anterolateral hypokinesis. Moderate diastolic dysfunction. Normal RV size and systolic function. Mild MR. Moderate aortic stenosis. Dilated IVC suggestive of elevated RV filling pressure.  Per cardiology notes pt has difficulty maintaining Mg+ levels. Scheduled for recheck 03/23/2018.  Ref. Range 10/28/2017 12:26 11/01/2017 23:03 11/04/2017 17:00 01/05/2018 03:21 03/14/2018 09:14  Magnesium Latest Ref Range: 1.7 - 2.4 mg/dL 1.1 (L) 1.5 (L) 1.3 (L) 1.6 (L) 0.9 (LL)   Discussed case with Dr. Ermalene Postin. He advised that given recent cardiac followup  okay to proceed as planned. She will need DOS eval to ensure she is euvolemic and not having afib RVR. If magnesium continues to be <1.0 consider IV replacement perioperatively.  VS: There were no vitals taken for this visit.  PROVIDERS: Rutherford Guys, MD is PCP  Fransico Him, MD is Cardiologist last seen 01/30/2018  LABS: Will need DOS CBC. BMP 03/14/2018 with mildly increased creatinine c/w her CKDIII. Significant hypomagnesemia, she is scheduled for recheck 03/23/2018.   Ref. Range 03/14/2018 31:43  BASIC METABOLIC PANEL Unknown Rpt (A)  Sodium Latest Ref Range: 135 - 145 mmol/L 138  Potassium Latest Ref Range: 3.5 - 5.1 mmol/L 4.2  Chloride Latest Ref Range: 98 - 111 mmol/L 103  CO2 Latest Ref Range: 22 - 32 mmol/L 30  Glucose Latest Ref Range: 70 - 99 mg/dL 145 (H)  BUN Latest Ref Range: 8 - 23 mg/dL 22  Creatinine Latest Ref Range: 0.44 - 1.00 mg/dL 1.14 (H)  Calcium Latest Ref Range: 8.9 - 10.3 mg/dL 8.4 (L)  Anion gap Latest Ref Range: 5 - 15  5  Magnesium Latest Ref Range: 1.7 - 2.4 mg/dL 0.9 (LL)  GFR, Est Non African American Latest Ref Range: >60 mL/min 49 (L)  GFR, Est African American Latest Ref Range: >60 mL/min 57 (L)    IMAGES: PORTABLE CHEST 1 VIEW 01/05/2018:  COMPARISON:  01/04/2018  FINDINGS: Cardiac shadow is enlarged but stable. The lungs are well aerated bilaterally. No focal infiltrate or sizable effusion is seen. No bony abnormality is noted.  IMPRESSION: No acute abnormality seen.  EKG: 09/17/2017: Sinus rhythm with marked sinus arrhythmia. Rate 68.Left axis deviation. Poor R wave progression  CV: TTE 01/03/2018: Study Conclusions  - Procedure narrative: Transthoracic echocardiography. Image   quality was adequate. The study was technically difficult. - Left ventricle: The cavity size was normal. Wall thickness was   increased in a pattern of mild LVH. Systolic function was normal.   The estimated ejection fraction was in the range of  55% to 60%.   Possible mild anterolateral hypokinesis. Features are consistent   with a pseudonormal left ventricular filling pattern, with   concomitant abnormal relaxation and increased filling pressure   (grade 2 diastolic dysfunction). - Aortic valve: Trileaflet; moderately calcified leaflets. There   was moderate stenosis. Mean gradient (S): 19 mm Hg. Peak gradient   (S): 30 mm Hg. Valve area (VTI): 1.16 cm^2. - Mitral valve: Moderately calcified annulus. There was mild   regurgitation. - Left atrium: The atrium was mildly dilated. - Right ventricle: The cavity size was normal. Systolic function   was normal. - Pulmonary arteries: No complete TR doppler jet so unable to   estimate PA systolic pressure. - Systemic veins: IVC measured 2.6 cm with < 50% respirophasic   variation, suggesting RA pressure 15 mmHg. - Pericardium, extracardiac: A trivial pericardial effusion was   identified.  Impressions:  - Normal LV size with mild LV hypertrophy. EF 55-60% with possible   mild anterolateral hypokinesis. Moderate diastolic dysfunction.   Normal RV size and systolic function. Mild MR. Moderate aortic   stenosis. Dilated IVC suggestive of elevated RV filling pressure.  Past Medical History:  Diagnosis Date  . Abnormal EKG 07/31/2013  . Arthritis    "hands" (03/06/2015)  . Asthma   . Carpal tunnel syndrome, bilateral   . Chronic kidney disease   . Complication of anesthesia    slow to wake up  . Coronary artery disease     2 v CAD with CTO of the RCA and high grade bifurcational LCx/OM stenosis. S/P PCI DES x 2 to the LCx/OM.  Marland Kitchen Diabetic peripheral neuropathy ([place]) "since 1996"  . GERD (gastroesophageal reflux disease)   . Goiter   . Headache    migraines prior to menopause  . History of shingles 06/01/2013  . Hyperlipidemia LDL goal <70 10/13/2015  . Hypertension   . Hyperthyroidism   . PAF (paroxysmal atrial fibrillation) ([place]) 04/29/2015   CHADS2VASC score of 5 now on  Apixaban  . Pneumonia ~ 1976  . Tremors of nervous system   . Type II diabetes mellitus (HCC)    insulin dependent    Past Surgical History:  Procedure Laterality Date  . ABDOMINAL HYSTERECTOMY  1988   age 71; CERVICAL DYSPLASIA; ovaries intact.   . AMPUTATION Right 01/23/2016   Procedure: Right 3rd Ray Amputation;  Surgeon: [name], MD;  Location: [place];  Service: Orthopedics;  Laterality: Right;  . AMPUTATION Right 02/13/2016   Procedure: Right Transmetatarsal Amputation;  Surgeon: [name], MD;  Location: [place];  Service: Orthopedics;  Laterality: Right;  . CARDIAC CATHETERIZATION N/A 02/27/2015   Procedure: Left Heart Cath and Coronary Angiography;  Surgeon: [name], MD; LAD 40%, mCFX 80%, OM 70%, RCA 100% calcified       . CARDIAC CATHETERIZATION N/A 03/06/2015   Procedure: Coronary Stent Intervention;  Surgeon: [name], MD;  Location: [place] [facility];  Service: Cardiovascular;  Laterality: N/A;  Mid CX 3.50x12 promus DES w/ 0% resdual and Prox OM1 2.50x20 promus  DES w/ 20% residual  . CARDIOVERSION    . CARPAL TUNNEL RELEASE Right Nov 2015  . CARPAL TUNNEL RELEASE Right 1992; 05/2014   [name]; [place]  . CESAREAN SECTION  1982; 1984  . FOOT NEUROMA SURGERY Bilateral 2000  . I&D EXTREMITY Left 01/06/2018   Procedure: DEBRIDEMENT ULCER LEFT FOOT;  Surgeon: [name], MD;  Location: [place];  Service: Orthopedics;  Laterality: Left;  . KNEE ARTHROSCOPY Right ~ 2003   "meniscus repair"  . SHOULDER OPEN ROTATOR CUFF REPAIR Right 1996; 1998   "w/fracture repair"  . THYROID SURGERY  2000   "removed lots of nodules"  . TONSILLECTOMY  1976    MEDICATIONS: No current facility-administered medications for this encounter.    Marland Kitchen albuterol (PROVENTIL HFA) 108 (90 Base) MCG/ACT inhaler  . albuterol (PROVENTIL) (2.5 MG/3ML) 0.083% nebulizer solution  . Alcohol Swabs (B-D SINGLE USE SWABS REGULAR) PADS  . amLODipine (NORVASC) 5 MG tablet  . apixaban  (ELIQUIS) 5 MG TABS tablet  . atorvastatin (LIPITOR) 20 MG tablet  . Blood Glucose Calibration (TRUE METRIX LEVEL 1) Low SOLN  . Blood Glucose Monitoring Suppl (TRUE METRIX AIR GLUCOSE METER) w/Device KIT  . buPROPion (WELLBUTRIN SR) 150 MG 12 hr tablet  . fenofibrate (TRICOR) 145 MG tablet  . ferrous sulfate 325 (65 FE) MG tablet  . fluconazole (DIFLUCAN) 100 MG tablet  . FLUoxetine (PROZAC) 20 MG capsule  . furosemide (LASIX) 20 MG tablet  . gabapentin (NEURONTIN) 300 MG capsule  . glucose blood (TRUE METRIX BLOOD GLUCOSE TEST) test strip  . insulin degludec (TRESIBA FLEXTOUCH) 100 UNIT/ML SOPN FlexTouch Pen  . Insulin Pen Needle 32G X 4 MM MISC  . Insulin Syringe-Needle U-100 (INSULIN SYRINGE .5CC/31GX5/16") 31G X 5/16" 0.5 ML MISC  . magnesium oxide (MAG-OX) 400 (241.3 Mg) MG tablet  . metFORMIN (GLUCOPHAGE-XR) 500 MG 24 hr tablet  . methimazole (TAPAZOLE) 10 MG tablet  . metoprolol succinate (TOPROL-XL) 25 MG 24 hr tablet  . nitroGLYCERIN (NITROSTAT) 0.4 MG SL tablet  . ondansetron (ZOFRAN-ODT) 8 MG disintegrating tablet  . pantoprazole (PROTONIX) 40 MG tablet  . polyethylene glycol (MIRALAX / GLYCOLAX) packet  . potassium chloride SA (K-DUR,KLOR-CON) 20 MEQ tablet  . senna (SENOKOT) 8.6 MG TABS tablet  . sotalol (BETAPACE) 120 MG tablet  . traMADol (ULTRAM) 50 MG tablet  . triamcinolone cream (KENALOG) 0.1 %  . TRUEPLUS LANCETS 28G MISC    Wynonia Musty [facility] Short Stay Center/Anesthesiology Phone [phone] 03/21/2018 11:40 AM

## 2018-03-21 NOTE — Anesthesia Preprocedure Evaluation (Deleted)
Anesthesia Evaluation    Airway        Dental   Pulmonary former smoker,           Cardiovascular hypertension,      Neuro/Psych    GI/Hepatic   Endo/Other  diabetes  Renal/GU      Musculoskeletal   Abdominal   Peds  Hematology   Anesthesia Other Findings   Reproductive/Obstetrics                             Anesthesia Physical Anesthesia Plan  ASA:   Anesthesia Plan:    Post-op Pain Management:    Induction:   PONV Risk Score and Plan:   Airway Management Planned:   Additional Equipment:   Intra-op Plan:   Post-operative Plan:   Informed Consent:   Plan Discussed with:   Anesthesia Plan Comments: (See PAT note  03/21/2018 by Karoline Caldwell, PA-C )        Anesthesia Quick Evaluation

## 2018-03-22 DIAGNOSIS — E1122 Type 2 diabetes mellitus with diabetic chronic kidney disease: Secondary | ICD-10-CM | POA: Diagnosis not present

## 2018-03-22 DIAGNOSIS — N183 Chronic kidney disease, stage 3 (moderate): Secondary | ICD-10-CM | POA: Diagnosis not present

## 2018-03-22 DIAGNOSIS — A409 Streptococcal sepsis, unspecified: Secondary | ICD-10-CM | POA: Diagnosis not present

## 2018-03-22 DIAGNOSIS — I5032 Chronic diastolic (congestive) heart failure: Secondary | ICD-10-CM | POA: Diagnosis not present

## 2018-03-22 DIAGNOSIS — J449 Chronic obstructive pulmonary disease, unspecified: Secondary | ICD-10-CM | POA: Diagnosis not present

## 2018-03-22 DIAGNOSIS — L02416 Cutaneous abscess of left lower limb: Secondary | ICD-10-CM | POA: Diagnosis not present

## 2018-03-22 DIAGNOSIS — D631 Anemia in chronic kidney disease: Secondary | ICD-10-CM | POA: Diagnosis not present

## 2018-03-22 DIAGNOSIS — L03116 Cellulitis of left lower limb: Secondary | ICD-10-CM | POA: Diagnosis not present

## 2018-03-22 DIAGNOSIS — I130 Hypertensive heart and chronic kidney disease with heart failure and stage 1 through stage 4 chronic kidney disease, or unspecified chronic kidney disease: Secondary | ICD-10-CM | POA: Diagnosis not present

## 2018-03-22 NOTE — Telephone Encounter (Signed)
Ms. Vankleeck also needs cardiac clearance.  Surgery cancelled until we can obtain clearance and insurance auth.  I called [facility] and left a message for her to return my call so that we could discuss.

## 2018-03-23 ENCOUNTER — Ambulatory Visit (HOSPITAL_COMMUNITY)
Admission: RE | Admit: 2018-03-23 | Discharge: 2018-03-23 | Disposition: A | Discharge status: Home / Self Care | Payer: Medicare PPO | Source: Ambulatory Visit | Attending: Nurse Practitioner | Admitting: Nurse Practitioner

## 2018-03-23 ENCOUNTER — Ambulatory Visit (INDEPENDENT_AMBULATORY_CARE_PROVIDER_SITE_OTHER): Payer: Medicare PPO | Admitting: Physician Assistant

## 2018-03-23 ENCOUNTER — Ambulatory Visit (HOSPITAL_COMMUNITY)
Admission: RE | Admit: 2018-03-23 | Discharge: 2018-03-23 | Disposition: A | Discharge status: Home / Self Care | Payer: Medicare PPO | Source: Ambulatory Visit | Attending: Orthopedic Surgery | Admitting: Orthopedic Surgery

## 2018-03-23 DIAGNOSIS — M75121 Complete rotator cuff tear or rupture of right shoulder, not specified as traumatic: Secondary | ICD-10-CM | POA: Diagnosis not present

## 2018-03-23 DIAGNOSIS — M12811 Other specific arthropathies, not elsewhere classified, right shoulder: Secondary | ICD-10-CM | POA: Insufficient documentation

## 2018-03-23 DIAGNOSIS — M7541 Impingement syndrome of right shoulder: Secondary | ICD-10-CM | POA: Insufficient documentation

## 2018-03-23 DIAGNOSIS — M25511 Pain in right shoulder: Secondary | ICD-10-CM | POA: Diagnosis not present

## 2018-03-23 LAB — MAGNESIUM: MAGNESIUM: 1.6 mg/dL — AB (ref 1.7–2.4)

## 2018-03-23 NOTE — Telephone Encounter (Signed)
I called [facility] and adv that we need to cancel case at this time. S/w Bronwen Betters. Call [phone]

## 2018-03-24 ENCOUNTER — Ambulatory Visit (HOSPITAL_COMMUNITY): Admission: RE | Admit: 2018-03-24 | Payer: Medicare PPO | Source: Ambulatory Visit | Admitting: Orthopedic Surgery

## 2018-03-24 ENCOUNTER — Encounter (HOSPITAL_COMMUNITY): Admission: RE | Payer: Self-pay | Source: Ambulatory Visit

## 2018-03-24 ENCOUNTER — Other Ambulatory Visit (HOSPITAL_COMMUNITY): Payer: Self-pay | Admitting: *Deleted

## 2018-03-24 DIAGNOSIS — I4819 Other persistent atrial fibrillation: Secondary | ICD-10-CM

## 2018-03-24 SURGERY — ARTHROSCOPY, SHOULDER
Anesthesia: General | Laterality: Right

## 2018-03-28 ENCOUNTER — Telehealth (INDEPENDENT_AMBULATORY_CARE_PROVIDER_SITE_OTHER): Payer: Self-pay

## 2018-03-28 ENCOUNTER — Telehealth (INDEPENDENT_AMBULATORY_CARE_PROVIDER_SITE_OTHER): Payer: Self-pay | Admitting: Orthopedic Surgery

## 2018-03-28 ENCOUNTER — Other Ambulatory Visit: Payer: Self-pay | Admitting: Cardiology

## 2018-03-28 DIAGNOSIS — I2510 Atherosclerotic heart disease of native coronary artery without angina pectoris: Secondary | ICD-10-CM

## 2018-03-28 NOTE — Telephone Encounter (Signed)
I called and left message for OT to go ahead with visit. Thank you

## 2018-03-28 NOTE — Telephone Encounter (Signed)
Received call from [facility] (OT) needing verbal orders to visit patient one more time for discharge. The number to contact [facility] is [phone]

## 2018-03-28 NOTE — Telephone Encounter (Signed)
Called pt and advised of MRi report. Pt would like to proceed with surgery. Pt cx for 04/04/18. Surgical sheet to Dr. Sharol Given for completion

## 2018-03-28 NOTE — Telephone Encounter (Signed)
-----   Message from [name], MD sent at 03/27/2018 10:46 AM EST ----- Call patient her MRI scan shows a large rotator cuff tear we could treat this with arthroscopic debridement.

## 2018-03-29 DIAGNOSIS — I130 Hypertensive heart and chronic kidney disease with heart failure and stage 1 through stage 4 chronic kidney disease, or unspecified chronic kidney disease: Secondary | ICD-10-CM | POA: Diagnosis not present

## 2018-03-29 DIAGNOSIS — L03116 Cellulitis of left lower limb: Secondary | ICD-10-CM | POA: Diagnosis not present

## 2018-03-29 DIAGNOSIS — N183 Chronic kidney disease, stage 3 (moderate): Secondary | ICD-10-CM | POA: Diagnosis not present

## 2018-03-29 DIAGNOSIS — A409 Streptococcal sepsis, unspecified: Secondary | ICD-10-CM | POA: Diagnosis not present

## 2018-03-29 DIAGNOSIS — J449 Chronic obstructive pulmonary disease, unspecified: Secondary | ICD-10-CM | POA: Diagnosis not present

## 2018-03-29 DIAGNOSIS — I5032 Chronic diastolic (congestive) heart failure: Secondary | ICD-10-CM | POA: Diagnosis not present

## 2018-03-29 DIAGNOSIS — E1122 Type 2 diabetes mellitus with diabetic chronic kidney disease: Secondary | ICD-10-CM | POA: Diagnosis not present

## 2018-03-29 DIAGNOSIS — L02416 Cutaneous abscess of left lower limb: Secondary | ICD-10-CM | POA: Diagnosis not present

## 2018-03-29 DIAGNOSIS — D631 Anemia in chronic kidney disease: Secondary | ICD-10-CM | POA: Diagnosis not present

## 2018-03-30 ENCOUNTER — Telehealth (INDEPENDENT_AMBULATORY_CARE_PROVIDER_SITE_OTHER): Payer: Self-pay | Admitting: Orthopedic Surgery

## 2018-03-30 NOTE — Telephone Encounter (Signed)
Malori with [facility] says patient has developed a new area of concern - a spot on the bottom of her right foot. She said there was a Designer, jewellery with [employer] going out today to evaluate. Please call [facility] # [phone]

## 2018-03-30 NOTE — Telephone Encounter (Signed)
I called and lm on vm to advise that we are happy to see the pt in the office tomorrow. Asked for a return call to advise what it is that they are needing. If she needs an appt to let us know and we will be happy to see her.

## 2018-03-31 ENCOUNTER — Other Ambulatory Visit: Payer: Self-pay

## 2018-03-31 ENCOUNTER — Ambulatory Visit: Payer: Medicare PPO | Admitting: Family Medicine

## 2018-03-31 ENCOUNTER — Encounter: Payer: Self-pay | Admitting: Family Medicine

## 2018-03-31 VITALS — BP 125/69 | HR 66 | Temp 98.0°F | Ht 64.0 in | Wt 168.0 lb

## 2018-03-31 DIAGNOSIS — E11621 Type 2 diabetes mellitus with foot ulcer: Secondary | ICD-10-CM

## 2018-03-31 DIAGNOSIS — L97411 Non-pressure chronic ulcer of right heel and midfoot limited to breakdown of skin: Secondary | ICD-10-CM

## 2018-03-31 NOTE — Telephone Encounter (Signed)
Can you make an appt for this pt for Monday 9:00am Monday with Dr. Sharol Given please. Right foot ulcer and then we can also set up her shoulder scope while in office? Thanks!

## 2018-03-31 NOTE — Progress Notes (Signed)
Subjective:  By signing my name below, I, Moises Blood, attest that this documentation has been prepared under the direction and in the presence of Merri Ray, MD. Electronically Signed: Moises Blood, [place]. 03/31/2018 , 11:23 AM .  Patient was seen in Room 8 .   Patient ID: Judith [name], female    DOB: [birth year], 67 y.o.   MRN: [id number] Chief Complaint  Patient presents with  . right foot pain     possible ulcer forming    HPI Judith [name] is a 67 y.o. female Here for possible ulcer forming in right foot. She has a history of multiple medical problems including diabetes, afib, peripheral neuropathy, and right foot amputation.   Patient reports being seen by home health nurse a week ago and noticed a little brown circle. The nurse informs she believes it's the start of an ulcer. Over the past week, patient has noticed the area getting bigger and deeper. She was seen by another home health nurse yesterday, and was recommended to be seen today. She has noticed some pain and sense of pressure in the area. She's been applying silvadene cream, antiseptic cream and band-aid. She has a prosthetic in her right shoe, fitted by [employer] after Christmas 2017. Her right foot amputation was in Oct 2017. She notes using silvadene as she's had reaction with neosporin in the past. She's been applying silvadene cream over the past week.   She's followed by Dr. Sharol Given, who checks her left foot. She will also have right shoulder operation done by Dr. Sharol Given. She hasn't informed him of her new ulcer.   Patient Active Problem List   Diagnosis Date Noted  . Cutaneous abscess of left foot   . Acute respiratory failure with hypoxia ([place])   . Cellulitis 01/02/2018  . LGI bleed   . Acute blood loss anemia   . Wide-complex tachycardia ([place]) 07/04/2017  . Type II diabetes mellitus, uncontrolled ([place]) 07/04/2017  . Peripheral neuropathy 07/04/2017  . H/O hyperthyroidism 07/04/2017  . S/P transmetatarsal  amputation of foot, right ([place]) 04/19/2016  . Obstructive sleep apnea 11/26/2015  . Bilateral carpal tunnel syndrome 11/26/2015  . Hypomagnesemia 11/16/2015  . Hyperlipidemia LDL goal <70 10/13/2015  . Abnormality of gait 09/02/2015  . Memory loss 08/12/2015  . Diabetic peripheral neuropathy ([place]) 08/12/2015  . Vitamin D deficiency 08/12/2015  . Hyperthyroidism 04/30/2015  . Heme positive stool   . Persistent atrial fibrillation 04/29/2015  . Coronary artery disease with stable angina pectoris ([place]) 03/29/2015  . Abnormal nuclear stress test   . History of goiter 09/28/2014  . GERD (gastroesophageal reflux disease) 07/31/2013  . Depression with anxiety 06/01/2013  . DM2 (diabetes mellitus, type 2) ([place]) 01/30/2013  . HTN (hypertension) 01/30/2013   Past Medical History:  Diagnosis Date  . Abnormal EKG 07/31/2013  . Arthritis    "hands" (03/06/2015)  . Asthma   . Carpal tunnel syndrome, bilateral   . Chronic kidney disease   . Complication of anesthesia    slow to wake up  . Coronary artery disease     2 v CAD with CTO of the RCA and high grade bifurcational LCx/OM stenosis. S/P PCI DES x 2 to the LCx/OM.  Marland Kitchen Diabetic peripheral neuropathy ([place]) "since 1996"  . GERD (gastroesophageal reflux disease)   . Goiter   . Headache    migraines prior to menopause  . History of shingles 06/01/2013  . Hyperlipidemia LDL goal <70 10/13/2015  . Hypertension   . Hyperthyroidism   .  PAF (paroxysmal atrial fibrillation) ([place]) 04/29/2015   CHADS2VASC score of 5 now on Apixaban  . Pneumonia ~ 1976  . Tremors of nervous system   . Type II diabetes mellitus (H[facility])    insulin dependent   Past Surgical History:  Procedure Laterality Date  . ABDOMINAL HYSTERECTOMY  1988   age 88; CERVICAL DYSPLASIA; ovaries intact.   . AMPUTATION Right 01/23/2016   Procedure: Right 3rd Ray Amputation;  Surgeon: [name], MD;  Location: [place];  Service: Orthopedics;  Laterality: Right;  . AMPUTATION Right  02/13/2016   Procedure: Right Transmetatarsal Amputation;  Surgeon: [name], MD;  Location: [place];  Service: Orthopedics;  Laterality: Right;  . CARDIAC CATHETERIZATION N/A 02/27/2015   Procedure: Left Heart Cath and Coronary Angiography;  Surgeon: [name], MD; LAD 40%, mCFX 80%, OM 70%, RCA 100% calcified       . CARDIAC CATHETERIZATION N/A 03/06/2015   Procedure: Coronary Stent Intervention;  Surgeon: [name], MD;  Location: [facility];  Service: Cardiovascular;  Laterality: N/A;  Mid CX 3.50x12 promus DES w/ 0% resdual and Prox OM1 2.50x20 promus DES w/ 20% residual  . CARDIOVERSION    . CARPAL TUNNEL RELEASE Right Nov 2015  . CARPAL TUNNEL RELEASE Right 1992; 05/2014   [name]; [place]  . CESAREAN SECTION  1982; 1984  . FOOT NEUROMA SURGERY Bilateral 2000  . I&D EXTREMITY Left 01/06/2018   Procedure: DEBRIDEMENT ULCER LEFT FOOT;  Surgeon: [name], MD;  Location: [facility];  Service: Orthopedics;  Laterality: Left;  . KNEE ARTHROSCOPY Right ~ 2003   "meniscus repair"  . SHOULDER OPEN ROTATOR CUFF REPAIR Right 1996; 1998   "w/fracture repair"  . THYROID SURGERY  2000   "removed lots of nodules"  . TONSILLECTOMY  1976   Allergies  Allergen Reactions  . Contrast Media [Iodinated Diagnostic Agents] Hives and Other (See Comments)    Spoke to patient, Iodine allergy is really IV contrast allergy.   . Dilaudid [Hydromorphone Hcl] Other (See Comments)    HEADACHE  . Novolog [Insulin Aspart] Shortness Of Breath and Other (See Comments)    "breathing problems"  . Codeine Nausea And Vomiting and Other (See Comments)    HIGH DOSES-SEVERE VOMITING  . Iodine Other (See Comments)    MUST HAVE BENADRYL PRIOR TO PROCEDURE AND RIGHT BEFORE TREATMENT TO COUNTERACT REACTION-BLISTERING REACTION DERMATOLOGICAL  . Penicillins Itching, Rash and Other (See Comments)    Has patient had a PCN reaction causing immediate rash, facial/tongue/throat swelling, SOB or  lightheadedness with hypotension: no Has patient had a PCN reaction causing severe rash involving mucus membranes or skin necrosis: No Has patient had a PCN reaction that required hospitalization No Has patient had a PCN reaction occurring within the last 10 years: No If all of the above answers are "NO", then may proceed with Cephalosporin use.  CHEST SIZED RASH AND ITCHING   . Propofol Other (See Comments)    "Breathing problems - asthma attack" Can take with benadryl   . Ace Inhibitors Cough  . Demerol [Meperidine] Nausea And Vomiting  . Neosporin [Neomycin-Bacitracin Zn-Polymyx] Itching, Rash and Other (See Comments)    MAKES REACTIONS WORSE WHEN USING AS PROPHYLACTIC  . Percocet [Oxycodone-Acetaminophen] Rash  . Tape Itching and Rash   Prior to Admission medications   Medication Sig Start Date End Date Taking? Authorizing Provider  albuterol (PROVENTIL HFA) 108 (90 Base) MCG/ACT inhaler Inhale 1-2 puffs into the lungs every 6 (six)  hours as needed for wheezing or shortness of breath. 11/07/15   [name], MD  albuterol (PROVENTIL) (2.5 MG/3ML) 0.083% nebulizer solution Take 3 mLs (2.5 mg total) by nebulization every 6 (six) hours as needed for wheezing or shortness of breath. 08/31/17   [name], PA-C  Alcohol Swabs (B-D SINGLE USE SWABS REGULAR) PADS 1 each by Does not apply route 2 (two) times daily. Patient not taking: Reported on 03/21/2018 08/29/17   Renato Shin, MD  amLODipine (NORVASC) 5 MG tablet Take 1 tablet (5 mg total) by mouth daily. 01/11/18   Regalado, Belkys A, MD  apixaban (ELIQUIS) 5 MG TABS tablet Take 1 tablet (5 mg total) by mouth 2 (two) times daily. 08/17/17   Sueanne Margarita, MD  atorvastatin (LIPITOR) 20 MG tablet Take 1 tablet (20 mg total) by mouth daily. 08/22/17   Sueanne Margarita, MD  Blood Glucose Calibration (TRUE METRIX LEVEL 1) Low SOLN 1 each by In Vitro route as needed. Patient not taking: Reported on 03/21/2018 08/25/17   Renato Shin, MD   Blood Glucose Monitoring Suppl (TRUE METRIX AIR GLUCOSE METER) w/Device KIT 1 each by Does not apply route daily. Patient not taking: Reported on 03/21/2018 08/25/17   Renato Shin, MD  buPROPion [facility] SR) 150 MG 12 hr tablet Take 150 mg by mouth 2 (two) times daily.    [provider]  fenofibrate (TRICOR) 145 MG tablet Take 1 tablet (145 mg total) by mouth daily. 08/22/17 08/17/18  Sueanne Margarita, MD  ferrous sulfate 325 (65 FE) MG tablet Take 1 tablet (325 mg total) by mouth 2 (two) times daily with a meal. 01/10/18   Regalado, Belkys A, MD  FLUoxetine (PROZAC) 20 MG capsule Take 1 capsule (20 mg total) by mouth at bedtime. 08/31/17   [name], PA-C  furosemide (LASIX) 20 MG tablet Take 3 tablets (60 mg total) by mouth daily. Patient not taking: Reported on 03/21/2018 02/01/18 02/01/19  Sueanne Margarita, MD  gabapentin (NEURONTIN) 300 MG capsule Take 3 capsules (900 mg total) by mouth 2 (two) times daily. TAKE 3 CAPSULES BY MOUTH EVERY  MORNING AND 3 CAPSULES AT BEDTIME Patient taking differently: Take 900 mg by mouth 2 (two) times daily.  08/31/17   [name], PA-C  glucose blood (TRUE METRIX BLOOD GLUCOSE TEST) test strip Used to check blood sugars 2x daily. Patient not taking: Reported on 03/21/2018 08/25/17   Renato Shin, MD  insulin degludec (TRESIBA FLEXTOUCH) 100 UNIT/ML SOPN FlexTouch Pen Inject 0.2 mLs (20 Units total) into the skin daily at 10 pm. Patient taking differently: Inject 15 Units into the skin daily at 10 pm.  07/05/17   Cristal Ford, DO  insulin lispro (HUMALOG) 100 UNIT/ML injection Inject 1-20 Units into the skin 3 (three) times daily before meals. Per sliding scale    [provider]  Insulin Pen Needle 32G X 4 MM MISC Use to inject insulin daily Patient not taking: Reported on 03/21/2018 08/13/16   Renato Shin, MD  Insulin Syringe-Needle U-100 (INSULIN SYRINGE .5CC/31GX5/16") 31G X 5/16" 0.5 ML MISC Use to inject insulin Patient not  taking: Reported on 03/21/2018 08/13/16   Renato Shin, MD  magnesium oxide (MAG-OX) 400 (241.3 Mg) MG tablet Take 1 tablet (400 mg total) by mouth daily. Patient taking differently: Take 400 mg by mouth 2 (two) times daily.  10/07/17   [name], PA-C  metFORMIN (GLUCOPHAGE-XR) 500 MG 24 hr tablet Take 4 tablets (2,000  mg total) by mouth daily with breakfast. Patient taking differently: Take 1,000 mg by mouth 2 (two) times daily.  08/25/17   Renato Shin, MD  methimazole (TAPAZOLE) 10 MG tablet Take 1 tablet (10 mg total) by mouth daily. 08/25/17   Renato Shin, MD  metoprolol succinate (TOPROL-XL) 25 MG 24 hr tablet Take 1 tablet (25 mg total) by mouth 2 (two) times daily. Take with or immediately following a meal. 03/28/18   Turner, Eber Hong, MD  nitroGLYCERIN (NITROSTAT) 0.4 MG SL tablet Place 1 tablet (0.4 mg total) under the tongue every 5 (five) minutes as needed for chest pain. 02/10/17   [name], PA-C  ondansetron (ZOFRAN-ODT) 8 MG disintegrating tablet Take 1 tablet (8 mg total) by mouth every 8 (eight) hours as needed for nausea or vomiting. 02/06/18   Rutherford Guys, MD  pantoprazole (PROTONIX) 40 MG tablet Take 1 tablet (40 mg total) by mouth daily. 08/31/17   [name], PA-C  polyethylene glycol (MIRALAX / GLYCOLAX) packet Take 17 g by mouth daily as needed. Patient not taking: Reported on 03/21/2018 01/10/18   Regalado, Jerald Kief A, MD  potassium chloride SA (K-DUR,KLOR-CON) 20 MEQ tablet Take 1 tablet (20 mEq total) by mouth daily. 01/11/18   Bonnielee Haff, MD  senna (SENOKOT) 8.6 MG TABS tablet Take 1 tablet (8.6 mg total) by mouth at bedtime. Patient not taking: Reported on 03/21/2018 01/11/18   Bonnielee Haff, MD  sotalol (BETAPACE) 120 MG tablet Take 1 tablet (120 mg total) by mouth every 12 (twelve) hours. 08/22/17   Sueanne Margarita, MD  traMADol (ULTRAM) 50 MG tablet Take 50 mg by mouth every 8 (eight) hours as needed for moderate pain or severe pain.  02/02/18   [provider]  triamcinolone cream (KENALOG) 0.1 % Apply 1 application topically 2 (two) times daily. Patient not taking: Reported on 03/21/2018 10/07/17   [name], PA-C  TRUEPLUS LANCETS 28G MISC 1 each by Does not apply route 2 (two) times daily. Patient not taking: Reported on 03/21/2018 08/29/17   Renato Shin, MD   Social History   Socioeconomic History  . Marital status: Single    Spouse name: Not on file  . Number of children: 2  . Years of education: Masters  . Highest education level: Not on file  Occupational History  . Occupation: Landscape architect  Social Needs  . Financial resource strain: Somewhat hard  . Food insecurity:    Worry: Sometimes true    Inability: Sometimes true  . Transportation needs:    Medical: No    Non-medical: No  Tobacco Use  . Smoking status: Former Smoker    Packs/day: 0.00    Years: 41.00    Pack years: 0.00    Types: Cigarettes    Last attempt to quit: 03/05/2015    Years since quitting: 3.0  . Smokeless tobacco: Never Used  . Tobacco comment: 04/29/2015 "quit smoking cigarettes 02/27/2015"  Substance and Sexual Activity  . Alcohol use: No  . Drug use: No  . Sexual activity: Not Currently    Birth control/protection: Post-menopausal, Surgical  Lifestyle  . Physical activity:    Days per week: 0 days    Minutes per session: 0 min  . Stress: Very much  Relationships  . Social connections:    Talks on phone: More than three times a week    Gets together: More than three times a week    Attends religious service: More than  4 times per year    Active member of club or organization: No    Attends meetings of clubs or organizations: Never    Relationship status: Never married  . Intimate partner violence:    Fear of current or ex partner: No    Emotionally abused: No    Physically abused: No    Forced sexual activity: No  Other Topics Concern  . 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      Lives with sister in [place].      Tobacco: 1 ppd x 41 years - quit 2016      Alcohol: none      Drugs: none      Exercise:  Walking in neighborhood; physical job.   Right-handed.   2 cups caffeine daily.   Review of Systems  Constitutional: Negative for chills, fatigue, fever and unexpected weight change.  Respiratory: Negative for cough.   Gastrointestinal: Negative for constipation, diarrhea, nausea and vomiting.  Skin: Positive for wound (right foot). Negative for rash.  Neurological: Negative for dizziness, weakness and headaches.       Objective:   Physical Exam  Constitutional: She is oriented to person, place, and time. She appears well-developed and well-nourished. No distress.  HENT:  Head: Normocephalic and atraumatic.  Eyes: Pupils are equal, round, and reactive to light. EOM are normal.  Neck: Neck supple.  Cardiovascular: Normal rate.  Pulmonary/Chest: Effort normal. No respiratory distress.  Musculoskeletal: Normal range of motion.  Neurological: She is alert and oriented to person, place, and time.  Skin: Skin is warm and dry.  Right foot: superficial ulcer of the right distal at the distal metatarsal stump; no surrounding erythema, previous amputation wound intact, no surrounding erythema, no discharge, no appreciable warmth, cap refill of surrounding skin is 1 second; there is a very small opening at the base of the wound that measures approximately 1x2 mm across only with clear fluid noted, unable to express, no surrounding tenderness; outer area of thickened skin measures 1 cm by 6 mm  Psychiatric: She has a normal mood and affect. Her behavior is normal.  Nursing note and vitals reviewed.   Vitals:   03/31/18 1052  BP: 125/69  Pulse: 66  Temp: 98  F (36.7 C)  TempSrc: Oral  SpO2: 97%  Weight: 168 lb (76.2 kg)  Height: '5\' 4"'  (1.626 m)       Assessment & Plan:   Judith [name] is a 67 y.o. female Diabetic ulcer of right midfoot associated with type 2 diabetes mellitus, limited to breakdown of skin ([place]) - Plan: Ambulatory referral to Orthopedic Surgery   -Possible early ulcer versus callus at distal foot.    - Recommended soft padding of the area for now, may need to meet with orthotist, footwear provider to adjust fit of insert.    - Recommended follow-up with her orthopedist to evaluate area as well.    - Continue daily soap and water cleansing, okay to continue with the Silvadene for now with repeat exam in 1 week.  RTC precautions if worsening.   No orders of the defined types were placed in this encounter.  Patient Instructions    I will refer you to Dr. Sharol Given to look  at the foot ulcer. Soft padding of area may be helpful for now,  Clean with soap and water at least daily, and ok to continue silvadene once per day for now. Recheck in next 1 week, but if area opens further or appears worse, recheck sooner.    Return to the clinic or go to the nearest emergency room if any of your symptoms worsen or new symptoms occur.   If you have lab work done today you will be contacted with your lab results within the next 2 weeks.  If you have not heard from [place] then please contact us. The fastest way to get your results is to register for My Chart.   IF you received an x-ray today, you will receive an invoice from [facility]. Please contact [facility] Rad[facility] at [phone] with questions or concerns regarding your invoice.   IF you received labwork today, you will receive an invoice from [place]. Please contact [facility] at [phone] with questions or concerns regarding your invoice.   Our billing staff will not be able to assist you with questions regarding bills from these companies.  You will be contacted with  the lab results as soon as they are available. The fastest way to get your results is to activate your My Chart account. Instructions are located on the last page of this paperwork. If you have not heard from [place] regarding the results in 2 weeks, please contact this office.       I personally performed the services described in this documentation, which was scribed in my presence. The recorded information has been reviewed and considered for accuracy and completeness, addended by me as needed, and agree with information above.  Signed,   Merri Ray, MD [facility] [place] [facility].  04/04/18 10:18 AM

## 2018-03-31 NOTE — Patient Instructions (Addendum)
  I will refer you to Dr. Sharol Given to look at the foot ulcer. Soft padding of area may be helpful for now,  Clean with soap and water at least daily, and ok to continue silvadene once per day for now. Recheck in next 1 week, but if area opens further or appears worse, recheck sooner.    Return to the clinic or go to the nearest emergency room if any of your symptoms worsen or new symptoms occur.   If you have lab work done today you will be contacted with your lab results within the next 2 weeks.  If you have not heard from [place] then please contact us. The fastest way to get your results is to register for My Chart.   IF you received an x-ray today, you will receive an invoice from [facility] Radiology. Please contact [facility] Radiology at [phone] with questions or concerns regarding your invoice.   IF you received labwork today, you will receive an invoice from [place]. Please contact [facility] at [phone] with questions or concerns regarding your invoice.   Our billing staff will not be able to assist you with questions regarding bills from these companies.  You will be contacted with the lab results as soon as they are available. The fastest way to get your results is to activate your My Chart account. Instructions are located on the last page of this paperwork. If you have not heard from [place] regarding the results in 2 weeks, please contact this office.

## 2018-03-31 NOTE — Telephone Encounter (Signed)
I called and lm on vm to advise that if we need to see the pt to please call and whomever answers the phone can make the appt for the pt. To call with details if an appt is not needed and leave a message I will call her back with answers once I have been in contact with Dr. Sharol Given.

## 2018-04-03 ENCOUNTER — Ambulatory Visit (INDEPENDENT_AMBULATORY_CARE_PROVIDER_SITE_OTHER): Payer: Medicare PPO | Admitting: Orthopedic Surgery

## 2018-04-03 ENCOUNTER — Telehealth: Payer: Self-pay

## 2018-04-03 ENCOUNTER — Ambulatory Visit (INDEPENDENT_AMBULATORY_CARE_PROVIDER_SITE_OTHER): Payer: Medicare PPO | Admitting: Endocrinology

## 2018-04-03 ENCOUNTER — Encounter (INDEPENDENT_AMBULATORY_CARE_PROVIDER_SITE_OTHER): Payer: Self-pay | Admitting: Orthopedic Surgery

## 2018-04-03 ENCOUNTER — Encounter: Payer: Self-pay | Admitting: Endocrinology

## 2018-04-03 VITALS — Ht 64.0 in | Wt 170.0 lb

## 2018-04-03 VITALS — BP 142/80 | HR 88 | Ht 64.0 in | Wt 170.6 lb

## 2018-04-03 DIAGNOSIS — E0590 Thyrotoxicosis, unspecified without thyrotoxic crisis or storm: Secondary | ICD-10-CM

## 2018-04-03 DIAGNOSIS — Z794 Long term (current) use of insulin: Secondary | ICD-10-CM

## 2018-04-03 DIAGNOSIS — E1101 Type 2 diabetes mellitus with hyperosmolarity with coma: Secondary | ICD-10-CM | POA: Diagnosis not present

## 2018-04-03 DIAGNOSIS — I4819 Other persistent atrial fibrillation: Secondary | ICD-10-CM

## 2018-04-03 DIAGNOSIS — Z89431 Acquired absence of right foot: Secondary | ICD-10-CM

## 2018-04-03 DIAGNOSIS — M7541 Impingement syndrome of right shoulder: Secondary | ICD-10-CM

## 2018-04-03 LAB — POCT GLYCOSYLATED HEMOGLOBIN (HGB A1C): HEMOGLOBIN A1C: 7.9 % — AB (ref 4.0–5.6)

## 2018-04-03 LAB — T4, FREE: Free T4: 0.96 ng/dL (ref 0.60–1.60)

## 2018-04-03 LAB — TSH: TSH: 2.37 u[IU]/mL (ref 0.35–4.50)

## 2018-04-03 LAB — MAGNESIUM: Magnesium: 0.8 mg/dL — CL (ref 1.5–2.5)

## 2018-04-03 MED ORDER — INSULIN DEGLUDEC 100 UNIT/ML ~~LOC~~ SOPN: 15.0000 [IU] | PEN_INJECTOR | Freq: Every day | SUBCUTANEOUS | 0 refills | Status: DC

## 2018-04-03 NOTE — Progress Notes (Signed)
Office Visit Note   Patient: Judith [name]           Date of Birth: June 10, 1950           MRN: [id number] Visit Date: 04/03/2018              Requested by: Rutherford Guys, MD [address] [place], [place] [address] PCP: Rutherford Guys, MD  Chief Complaint  Patient presents with  . Right Foot - Wound Check      HPI: Patient is a 67 year old woman who presents for 2 separate issues #1 status post right transmetatarsal amputation she has extra-depth shoes with attachment for bracing a custom orthotic and spacer she states she feels like she is developing a new ulcer on the right foot.  Patient also complains of pain with activities of daily living pain that wakes her up at night for the right shoulder rotator cuff pathology.  Assessment & Plan: Visit Diagnoses:  1. Impingement syndrome of right shoulder   2. Status post transmetatarsal amputation of foot, right ([place])     Plan: Discussed with the patient treatment options for the right shoulder and she states she would like to proceed with right shoulder arthroscopy.  Risk and benefits were discussed postoperative care was discussed patient states she understands and wishes to proceed at this time.  She will continue with the orthotics for the right foot she will wear these at all times and will follow-up if she develops any full-thickness ulceration.  Follow-Up Instructions: Return in about 2 weeks (around 04/17/2018).   Ortho Exam  Patient is alert, oriented, no adenopathy, well-dressed, normal affect, normal respiratory effort. Examination of the right shoulder she has abduction and flexion to 90 degrees she has internal and external rotation of 45 degrees she has pain with Neer and Hawkins impingement test pain with drop arm test she has pain to palpation over the biceps tendon.  Examination of the right foot she has an ankle which is neutral dorsiflexion.  She has developed a small abrasion beneath the first metatarsal there is no  redness no cellulitis no skin breakdown no callus.  Discussed the importance of watching this to make sure she does not develop an ulcer.  Imaging: No results found. No images are attached to the encounter.  Labs: Lab Results  Component Value Date   HGBA1C 7.9 (A) 04/03/2018   HGBA1C 7.7 (A) 01/30/2018   HGBA1C 8.7 (A) 12/05/2017   ESRSEDRATE 133 (H) 01/02/2018   CRP 23.5 (H) 01/02/2018   REPTSTATUS 01/12/2018 FINAL 01/06/2018   GRAMSTAIN  01/06/2018    MODERATE WBC PRESENT, PREDOMINANTLY PMN RARE GRAM POSITIVE COCCI    CULT  01/06/2018    RARE STREPTOCOCCUS MITIS/ORALIS RARE STAPHYLOCOCCUS SPECIES (COAGULASE NEGATIVE) NO ANAEROBES ISOLATED Performed at [facility], [place] [address], [place], [place] [address]    LABORGA STREPTOCOCCUS MITIS/ORALIS 01/06/2018     Lab Results  Component Value Date   ALBUMIN 3.5 02/05/2018   ALBUMIN 2.4 (L) 01/05/2018   ALBUMIN 4.2 09/19/2017    Body mass index is 29.18 kg/m.  Orders:  No orders of the defined types were placed in this encounter.  No orders of the defined types were placed in this encounter.    Procedures: No procedures performed  Clinical Data: No additional findings.  ROS:  All other systems negative, except as noted in the HPI. Review of Systems  Objective: Vital Signs: Ht 5\' 4"  (1.626 m)   Wt 170 lb (77.1  kg)   BMI 29.18 kg/m   Specialty Comments:  No specialty comments available.  PMFS History: Patient Active Problem List   Diagnosis Date Noted  . Cutaneous abscess of left foot   . Acute respiratory failure with hypoxia ([place])   . Cellulitis 01/02/2018  . LGI bleed   . Acute blood loss anemia   . Wide-complex tachycardia ([place]) 07/04/2017  . Type II diabetes mellitus, uncontrolled ([place]) 07/04/2017  . Peripheral neuropathy 07/04/2017  . H/O hyperthyroidism 07/04/2017  . S/P transmetatarsal amputation of foot, right ([place]) 04/19/2016  . Obstructive sleep apnea 11/26/2015  . Bilateral  carpal tunnel syndrome 11/26/2015  . Hypomagnesemia 11/16/2015  . Hyperlipidemia LDL goal <70 10/13/2015  . Abnormality of gait 09/02/2015  . Memory loss 08/12/2015  . Diabetic peripheral neuropathy ([place]) 08/12/2015  . Vitamin D deficiency 08/12/2015  . Hyperthyroidism 04/30/2015  . Heme positive stool   . Persistent atrial fibrillation 04/29/2015  . Coronary artery disease with stable angina pectoris ([place]) 03/29/2015  . Abnormal nuclear stress test   . History of goiter 09/28/2014  . GERD (gastroesophageal reflux disease) 07/31/2013  . Depression with anxiety 06/01/2013  . DM2 (diabetes mellitus, type 2) ([place]) 01/30/2013  . HTN (hypertension) 01/30/2013   Past Medical History:  Diagnosis Date  . Abnormal EKG 07/31/2013  . Arthritis    "hands" (03/06/2015)  . Asthma   . Carpal tunnel syndrome, bilateral   . Chronic kidney disease   . Complication of anesthesia    slow to wake up  . Coronary artery disease     2 v CAD with CTO of the RCA and high grade bifurcational LCx/OM stenosis. S/P PCI DES x 2 to the LCx/OM.  Marland Kitchen Diabetic peripheral neuropathy ([place]) "since 1996"  . GERD (gastroesophageal reflux disease)   . Goiter   . Headache    migraines prior to menopause  . History of shingles 06/01/2013  . Hyperlipidemia LDL goal <70 10/13/2015  . Hypertension   . Hyperthyroidism   . PAF (paroxysmal atrial fibrillation) ([place]) 04/29/2015   CHADS2VASC score of 5 now on Apixaban  . Pneumonia ~ 1976  . Tremors of nervous system   . Type II diabetes mellitus (HCC)    insulin dependent    Family History  Problem Relation Age of Onset  . Cancer Mother 62       bronchial cancer  . Breast cancer Mother   . Lung cancer Mother   . Hypertension Father   . COPD Father   . Heart disease Father 70       CAD with cardiac stenting  . Heart attack Father   . Parkinson's disease Father   . Allergies Sister   . Breast cancer Maternal Grandmother   . Emphysema Maternal Grandfather   .  Leukemia Paternal Grandmother   . Emphysema Paternal Grandfather   . Thyroid disease Neg Hx     Past Surgical History:  Procedure Laterality Date  . ABDOMINAL HYSTERECTOMY  1988   age 24; CERVICAL DYSPLASIA; ovaries intact.   . AMPUTATION Right 01/23/2016   Procedure: Right 3rd Ray Amputation;  Surgeon: [name], MD;  Location: [facility];  Service: Orthopedics;  Laterality: Right;  . AMPUTATION Right 02/13/2016   Procedure: Right Transmetatarsal Amputation;  Surgeon: [name], MD;  Location: [facility];  Service: Orthopedics;  Laterality: Right;  . CARDIAC CATHETERIZATION N/A 02/27/2015   Procedure: Left Heart Cath and Coronary Angiography;  Surgeon: [name], MD; LAD 40%, mCFX 80%, OM  70%, RCA 100% calcified       . CARDIAC CATHETERIZATION N/A 03/06/2015   Procedure: Coronary Stent Intervention;  Surgeon: [name], MD;  Location: [facility];  Service: Cardiovascular;  Laterality: N/A;  Mid CX 3.50x12 promus DES w/ 0% resdual and Prox OM1 2.50x20 promus DES w/ 20% residual  . CARDIOVERSION    . CARPAL TUNNEL RELEASE Right Nov 2015  . CARPAL TUNNEL RELEASE Right 1992; 05/2014   [name]; [place]  . CESAREAN SECTION  1982; 1984  . FOOT NEUROMA SURGERY Bilateral 2000  . I&D EXTREMITY Left 01/06/2018   Procedure: DEBRIDEMENT ULCER LEFT FOOT;  Surgeon: [name], MD;  Location: [facility];  Service: Orthopedics;  Laterality: Left;  . KNEE ARTHROSCOPY Right ~ 2003   "meniscus repair"  . SHOULDER OPEN ROTATOR CUFF REPAIR Right 1996; 1998   "w/fracture repair"  . THYROID SURGERY  2000   "removed lots of nodules"  . TONSILLECTOMY  1976   Social History   Occupational History  . Occupation: Landscape architect  Tobacco Use  . Smoking status: Former Smoker    Packs/day: 0.00    Years: 41.00    Pack years: 0.00    Types: Cigarettes    Last attempt to quit: 03/05/2015    Years since quitting: 3.0  . Smokeless tobacco: Never Used  . Tobacco comment: 04/29/2015 "quit  smoking cigarettes 02/27/2015"  Substance and Sexual Activity  . Alcohol use: No  . Drug use: No  . Sexual activity: Not Currently    Birth control/protection: Post-menopausal, Surgical

## 2018-04-03 NOTE — Progress Notes (Signed)
Subjective:    Patient ID: Judith [name], female    DOB: Mar 15, 1951, 67 y.o.   MRN: [id number]  HPI Pt returns for f/u of hyperthyroidism (pt says she took synthroid for a brief time in the 1990's, in an attempt to shrink a goiter; slightly suppressed TSH was first noted in late 2016, when she was in the hospital for new-onset AF; she converted back to SR; nuc med scan showed heterogeneous uptake; neck scar is from C-spine procedure). she takes tapazole as rx'ed.  pt states she feels well in general.   Pt also ret for f/u of DM: DM type: Insulin-requiring type 2. Dx'ed: 1438 Complications: polyneuropathy, renal failure, retinopathy, and CAD.   Therapy: insulin since 1997, and metformin.   GDM: never.  DKA: never. Severe hypoglycemia: never.   Pancreatitis: never.   Other: she said reg and novolog both caused sob, so she was changed to qd insulin.  Interval history:   no cbg record, but states cbg's often low in the middle of the night.  She takes humalog, 4 units with breakfast, and an uncertain amount at supper.  She takes tresiba, 15 units QHS.   Past Medical History:  Diagnosis Date  . Abnormal EKG 07/31/2013  . Arthritis    "hands" (03/06/2015)  . Asthma   . Carpal tunnel syndrome, bilateral   . Chronic kidney disease   . Complication of anesthesia    slow to wake up  . Coronary artery disease     2 v CAD with CTO of the RCA and high grade bifurcational LCx/OM stenosis. S/P PCI DES x 2 to the LCx/OM.  Marland Kitchen Diabetic peripheral neuropathy ([place]) "since 1996"  . GERD (gastroesophageal reflux disease)   . Goiter   . Headache    migraines prior to menopause  . History of shingles 06/01/2013  . Hyperlipidemia LDL goal <70 10/13/2015  . Hypertension   . Hyperthyroidism   . PAF (paroxysmal atrial fibrillation) ([place]) 04/29/2015   CHADS2VASC score of 5 now on Apixaban  . Pneumonia ~ 1976  . Tremors of nervous system   . Type II diabetes mellitus (HCC)    insulin dependent    Past  Surgical History:  Procedure Laterality Date  . ABDOMINAL HYSTERECTOMY  1988   age 5; CERVICAL DYSPLASIA; ovaries intact.   . AMPUTATION Right 01/23/2016   Procedure: Right 3rd Ray Amputation;  Surgeon: [name], MD;  Location: [facility];  Service: Orthopedics;  Laterality: Right;  . AMPUTATION Right 02/13/2016   Procedure: Right Transmetatarsal Amputation;  Surgeon: [name], MD;  Location: [facility];  Service: Orthopedics;  Laterality: Right;  . CARDIAC CATHETERIZATION N/A 02/27/2015   Procedure: Left Heart Cath and Coronary Angiography;  Surgeon: [name], MD; LAD 40%, mCFX 80%, OM 70%, RCA 100% calcified       . CARDIAC CATHETERIZATION N/A 03/06/2015   Procedure: Coronary Stent Intervention;  Surgeon: [name], MD;  Location: [facility];  Service: Cardiovascular;  Laterality: N/A;  Mid CX 3.50x12 promus DES w/ 0% resdual and Prox OM1 2.50x20 promus DES w/ 20% residual  . CARDIOVERSION    . CARPAL TUNNEL RELEASE Right Nov 2015  . CARPAL TUNNEL RELEASE Right 1992; 05/2014   [name]; [place]  . CESAREAN SECTION  1982; 1984  . FOOT NEUROMA SURGERY Bilateral 2000  . I&D EXTREMITY Left 01/06/2018   Procedure: DEBRIDEMENT ULCER LEFT FOOT;  Surgeon: [name], MD;  Location: [facility];  Service: Orthopedics;  Laterality: Left;  . KNEE ARTHROSCOPY Right ~ 2003   "meniscus repair"  . SHOULDER OPEN ROTATOR CUFF REPAIR Right 1996; 1998   "w/fracture repair"  . THYROID SURGERY  2000   "removed lots of nodules"  . TONSILLECTOMY  1976    Social History   Socioeconomic History  . Marital status: Single    Spouse name: Not on file  . Number of children: 2  . Years of education: Masters  . Highest education level: Not on file  Occupational History  . Occupation: Landscape architect  Social Needs  . Financial resource strain: Somewhat hard  . Food insecurity:    Worry: Sometimes true    Inability: Sometimes true  . Transportation needs:    Medical: No     Non-medical: No  Tobacco Use  . Smoking status: Former Smoker    Packs/day: 0.00    Years: 41.00    Pack years: 0.00    Types: Cigarettes    Last attempt to quit: 03/05/2015    Years since quitting: 3.0  . Smokeless tobacco: Never Used  . Tobacco comment: 04/29/2015 "quit smoking cigarettes 02/27/2015"  Substance and Sexual Activity  . Alcohol use: No  . Drug use: No  . Sexual activity: Not Currently    Birth control/protection: Post-menopausal, Surgical  Lifestyle  . Physical activity:    Days per week: 0 days    Minutes per session: 0 min  . Stress: Very much  Relationships  . Social connections:    Talks on phone: More than three times a week    Gets together: More than three times a week    Attends religious service: More than 4 times per year    Active member of club or organization: No    Attends meetings of clubs or organizations: Never    Relationship status: Never married  . Intimate partner violence:    Fear of current or ex partner: No    Emotionally abused: No    Physically abused: No    Forced sexual activity: No  Other Topics Concern  . 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      Lives with sister in [place].      Tobacco: 1 ppd x 41 years - quit 2016      Alcohol: none      Drugs: none      Exercise:  Walking in neighborhood; physical job.   Right-handed.   2 cups caffeine daily.    Current Outpatient Medications on File Prior to Visit  Medication Sig Dispense Refill  . albuterol (PROVENTIL HFA) 108 (90 Base) MCG/ACT inhaler Inhale 1-2 puffs into the lungs every 6 (six) hours as needed for wheezing or shortness of breath. 1 Inhaler 0  . albuterol (PROVENTIL) (2.5 MG/3ML) 0.083% nebulizer solution Take 3 mLs (2.5 mg total)  by nebulization every 6 (six) hours as needed for wheezing or shortness of breath. 150 mL 0  . Alcohol Swabs (B-D SINGLE USE SWABS REGULAR) PADS 1 each by Does not apply route 2 (two) times daily. 100 each 2  . amLODipine (NORVASC) 5 MG tablet Take 1 tablet (5 mg total) by mouth daily. 30 tablet 0  . apixaban (ELIQUIS) 5 MG TABS tablet Take  1 tablet (5 mg total) by mouth 2 (two) times daily. 180 tablet 3  . atorvastatin (LIPITOR) 20 MG tablet Take 1 tablet (20 mg total) by mouth daily. 90 tablet 3  . Blood Glucose Calibration (TRUE METRIX LEVEL 1) Low SOLN 1 each by In Vitro route as needed. 1 each 0  . Blood Glucose Monitoring Suppl (TRUE METRIX AIR GLUCOSE METER) w/Device KIT 1 each by Does not apply route daily. 1 kit 0  . buPROPion (WELLBUTRIN SR) 150 MG 12 hr tablet Take 150 mg by mouth 2 (two) times daily.    . fenofibrate (TRICOR) 145 MG tablet Take 1 tablet (145 mg total) by mouth daily. 90 tablet 3  . ferrous sulfate 325 (65 FE) MG tablet Take 1 tablet (325 mg total) by mouth 2 (two) times daily with a meal. 30 tablet 3  . FLUoxetine (PROZAC) 20 MG capsule Take 1 capsule (20 mg total) by mouth at bedtime. 90 capsule 2  . furosemide (LASIX) 20 MG tablet Take 3 tablets (60 mg total) by mouth daily. 273 tablet 3  . gabapentin (NEURONTIN) 300 MG capsule Take 3 capsules (900 mg total) by mouth 2 (two) times daily. TAKE 3 CAPSULES BY MOUTH EVERY  MORNING AND 3 CAPSULES AT BEDTIME (Patient taking differently: Take 900 mg by mouth 2 (two) times daily. ) 540 capsule 0  . glucose blood (TRUE METRIX BLOOD GLUCOSE TEST) test strip Used to check blood sugars 2x daily. 100 each 12  . Insulin Pen Needle 32G X 4 MM MISC Use to inject insulin daily 100 each 5  . Insulin Syringe-Needle U-100 (INSULIN SYRINGE .5CC/31GX5/16") 31G X 5/16" 0.5 ML MISC Use to inject insulin 100 each 5  . magnesium oxide (MAG-OX) 400 (241.3 Mg) MG tablet Take 1 tablet (400 mg total) by mouth daily. (Patient taking differently: Take  400 mg by mouth 2 (two) times daily. ) 30 tablet 6  . metFORMIN (GLUCOPHAGE-XR) 500 MG 24 hr tablet Take 4 tablets (2,000 mg total) by mouth daily with breakfast. (Patient taking differently: Take 1,000 mg by mouth 2 (two) times daily. ) 360 tablet 3  . methimazole (TAPAZOLE) 10 MG tablet Take 1 tablet (10 mg total) by mouth daily. 30 tablet 11  . metoprolol succinate (TOPROL-XL) 25 MG 24 hr tablet Take 1 tablet (25 mg total) by mouth 2 (two) times daily. Take with or immediately following a meal. 180 tablet 1  . nitroGLYCERIN (NITROSTAT) 0.4 MG SL tablet Place 1 tablet (0.4 mg total) under the tongue every 5 (five) minutes as needed for chest pain. 25 tablet 3  . ondansetron (ZOFRAN-ODT) 8 MG disintegrating tablet Take 1 tablet (8 mg total) by mouth every 8 (eight) hours as needed for nausea or vomiting. 20 tablet 1  . pantoprazole (PROTONIX) 40 MG tablet Take 1 tablet (40 mg total) by mouth daily. 90 tablet 0  . polyethylene glycol (MIRALAX / GLYCOLAX) packet Take 17 g by mouth daily as needed. 14 each 0  . potassium chloride SA (K-DUR,KLOR-CON) 20 MEQ tablet Take 1 tablet (20 mEq total) by mouth daily.    Marland Kitchen senna (SENOKOT) 8.6 MG TABS tablet Take 1 tablet (8.6 mg total) by mouth at bedtime. 120 each 0  . sotalol (BETAPACE) 120 MG tablet Take 1 tablet (120 mg total) by mouth every 12 (twelve) hours. 180 tablet 3  . traMADol (ULTRAM) 50 MG tablet Take 50 mg by mouth every 8 (eight) hours as needed for moderate pain or severe pain.  5  . triamcinolone cream (KENALOG) 0.1 % Apply 1 application topically 2 (two) times daily. 30 g 0  . TRUEPLUS LANCETS 28G MISC 1 each by Does not apply route 2 (two) times daily. 100 each 2   No current facility-administered medications on file prior to visit.     Allergies  Allergen Reactions  . Contrast Media [Iodinated Diagnostic Agents] Hives and Other (See Comments)    Spoke to patient, Iodine allergy is really IV contrast allergy.   . Dilaudid [Hydromorphone  Hcl] Other (See Comments)    HEADACHE  . Novolog [Insulin Aspart] Shortness Of Breath and Other (See Comments)    "breathing problems"  . Codeine Nausea And Vomiting and Other (See Comments)    HIGH DOSES-SEVERE VOMITING  . Iodine Other (See Comments)    MUST HAVE BENADRYL PRIOR TO PROCEDURE AND RIGHT BEFORE TREATMENT TO COUNTERACT REACTION-BLISTERING REACTION DERMATOLOGICAL  . Penicillins Itching, Rash and Other (See Comments)    Has patient had a PCN reaction causing immediate rash, facial/tongue/throat swelling, SOB or lightheadedness with hypotension: no Has patient had a PCN reaction causing severe rash involving mucus membranes or skin necrosis: No Has patient had a PCN reaction that required hospitalization No Has patient had a PCN reaction occurring within the last 10 years: No If all of the above answers are "NO", then may proceed with Cephalosporin use.  CHEST SIZED RASH AND ITCHING   . Propofol Other (See Comments)    "Breathing problems - asthma attack" Can take with benadryl   . Ace Inhibitors Cough  . Demerol [Meperidine] Nausea And Vomiting  . Neosporin [Neomycin-Bacitracin Zn-Polymyx] Itching, Rash and Other (See Comments)    MAKES REACTIONS WORSE WHEN USING AS PROPHYLACTIC  . Percocet [Oxycodone-Acetaminophen] Rash  . Tape Itching and Rash    Family History  Problem Relation Age of Onset  . Cancer Mother 59       bronchial cancer  . Breast cancer Mother   . Lung cancer Mother   . Hypertension Father   . COPD Father   . Heart disease Father 39       CAD with cardiac stenting  . Heart attack Father   . Parkinson's disease Father   . Allergies Sister   . Breast cancer Maternal Grandmother   . Emphysema Maternal Grandfather   . Leukemia Paternal Grandmother   . Emphysema Paternal Grandfather   . Thyroid disease Neg Hx     BP (!) 142/80 (BP Location: Left Arm, Patient Position: Sitting, Cuff Size: Normal)   Pulse 88   Ht '5\' 4"'  (1.626 m)   Wt 170 lb 9.6  oz (77.4 kg)   BMI 29.28 kg/m    Review of Systems Denies LOC.      Objective:   Physical Exam VITAL SIGNS:  See vs page GENERAL: no distress.  In wheelchair Pulses: foot pulses are intact bilaterally.   MSK: no deformity of the feet, except for right transmetatarsal amputation.  CV: no edema of the legs Skin:  No ulcer on the feet or ankles.  normal temp on the feet and ankles.  Few mm ulcer at the plantar aspect of the right foot Neuro: sensation is intact to touch on the feet and ankles, but decreased from normal Ext: There is onychomycosis of the toenails.     Lab Results  Component Value Date   TSH 2.01 01/30/2018   T4TOTAL 7.8 04/30/2015   Lab Results  Component Value Date   HGBA1C 7.9 (A) 04/03/2018  Lab Results  Component Value Date   CREATININE 1.14 (H) 03/14/2018   BUN 22 03/14/2018   NA 138 03/14/2018   K 4.2 03/14/2018   CL 103 03/14/2018   CO2 30 03/14/2018      Assessment & Plan:  Insulin-requiring type 2 DM, with renal insuff: this is the best control this pt should aim for, given this regimen, which does match insulin to her changing needs throughout the day Hypoglycemia: this could be due to taking humalog later in the day.  Hyperthyroidism: recheck today   Patient Instructions  check your blood sugar twice a day.  vary the time of day when you check, between before the 3 meals, and at bedtime.  also check if you have symptoms of your blood sugar being too high or too low.  please keep a record of the readings and bring it to your next appointment here (or you can bring the meter itself).  You can write it on any piece of paper.  please call us sooner if your blood sugar goes below 70, or if you have a lot of readings over 200.  Please stop taking the humalog, and: Take tresiba, 15 units daily.  Please come back for a follow-up appointment in 2 months.

## 2018-04-03 NOTE — Telephone Encounter (Signed)
Main lab just called with a critical mag 0.8

## 2018-04-03 NOTE — Patient Instructions (Addendum)
check your blood sugar twice a day.  vary the time of day when you check, between before the 3 meals, and at bedtime.  also check if you have symptoms of your blood sugar being too high or too low.  please keep a record of the readings and bring it to your next appointment here (or you can bring the meter itself).  You can write it on any piece of paper.  please call us sooner if your blood sugar goes below 70, or if you have a lot of readings over 200.  Please stop taking the humalog, and: Take tresiba, 15 units daily.  Please come back for a follow-up appointment in 2 months.

## 2018-04-04 ENCOUNTER — Inpatient Hospital Stay (INDEPENDENT_AMBULATORY_CARE_PROVIDER_SITE_OTHER): Payer: Medicare PPO | Admitting: Physician Assistant

## 2018-04-07 ENCOUNTER — Ambulatory Visit: Payer: Medicare PPO | Admitting: Family Medicine

## 2018-04-07 ENCOUNTER — Encounter: Payer: Self-pay | Admitting: Family Medicine

## 2018-04-07 ENCOUNTER — Other Ambulatory Visit: Payer: Self-pay

## 2018-04-07 VITALS — BP 122/64 | HR 77 | Temp 97.4°F | Ht 64.0 in | Wt 172.4 lb

## 2018-04-07 DIAGNOSIS — L97411 Non-pressure chronic ulcer of right heel and midfoot limited to breakdown of skin: Secondary | ICD-10-CM | POA: Diagnosis not present

## 2018-04-07 DIAGNOSIS — E11621 Type 2 diabetes mellitus with foot ulcer: Secondary | ICD-10-CM | POA: Diagnosis not present

## 2018-04-07 NOTE — Patient Instructions (Signed)
° ° ° °  If you have lab work done today you will be contacted with your lab results within the next 2 weeks.  If you have not heard from us then please contact us. The fastest way to get your results is to register for My Chart. ° ° °IF you received an x-ray today, you will receive an invoice from [facility]. Please contact [facility] at [phone] with questions or concerns regarding your invoice.  ° °IF you received labwork today, you will receive an invoice from [facility]. Please contact [facility] at [phone] with questions or concerns regarding your invoice.  ° °Our billing staff will not be able to assist you with questions regarding bills from these companies. ° °You will be contacted with the lab results as soon as they are available. The fastest way to get your results is to activate your My Chart account. Instructions are located on the last page of this paperwork. If you have not heard from us regarding the results in 2 weeks, please contact this office. °  ° ° ° °

## 2018-04-07 NOTE — Progress Notes (Signed)
11/29/201910:57 AM  Judith [name] 16-Aug-1950, 67 y.o. female [id number]  Chief Complaint  Patient presents with  . Follow-up    ulcer on the right foot    HPI:   Patient is a 67 y.o. female with past medical history significant for DM2, afib, peripheral neuropathy, and right foot amputation, among others who presents today for follouwp on DM ulcer  Seen on 03/31/18 - early ulcer vs callus, soft padding, refer back to ortho, dr Leilani Able Dr Sharol Given on 04/03/18 - abrasion , no ulcer, cont with orthotics Will be having right shoulder surgery on dec 12th.  Saw Dr Loanne Drilling, endo on 04/03/18 - a1c 7.9, crt 1.14, stopped humalog due to concerns for recurring hypoglycemia  Patient is doing well today She has no acute concerns   Fall Risk  02/06/2018 12/31/2017 10/21/2017 10/07/2017 09/20/2017  Falls in the past year? Yes No Yes Yes Yes  Number falls in past yr: 2 or more - - 2 or more 2 or more  Injury with Fall? No - Yes Yes Yes  Comment - - - - -  Risk Factor Category  - - - - High Fall Risk  Risk for fall due to : - - - - History of fall(s)  Risk for fall due to: Comment - - - - -  Follow up - - - Falls evaluation completed Falls evaluation completed;Education provided     Depression screen [facility] 2/9 03/31/2018 12/31/2017 10/21/2017  Decreased Interest 0 0 0  Down, Depressed, Hopeless 0 0 0  PHQ - 2 Score 0 0 0  Some recent data might be hidden    Allergies  Allergen Reactions  . Contrast Media [Iodinated Diagnostic Agents] Hives and Other (See Comments)    Spoke to patient, Iodine allergy is really IV contrast allergy.   . Dilaudid [Hydromorphone Hcl] Other (See Comments)    HEADACHE  . Novolog [Insulin Aspart] Shortness Of Breath and Other (See Comments)    "breathing problems"  . Codeine Nausea And Vomiting and Other (See Comments)    HIGH DOSES-SEVERE VOMITING  . Iodine Other (See Comments)    MUST HAVE BENADRYL PRIOR TO PROCEDURE AND RIGHT BEFORE TREATMENT TO COUNTERACT  REACTION-BLISTERING REACTION DERMATOLOGICAL  . Penicillins Itching, Rash and Other (See Comments)    Has patient had a PCN reaction causing immediate rash, facial/tongue/throat swelling, SOB or lightheadedness with hypotension: no Has patient had a PCN reaction causing severe rash involving mucus membranes or skin necrosis: No Has patient had a PCN reaction that required hospitalization No Has patient had a PCN reaction occurring within the last 10 years: No If all of the above answers are "NO", then may proceed with Cephalosporin use.  CHEST SIZED RASH AND ITCHING   . Propofol Other (See Comments)    "Breathing problems - asthma attack" Can take with benadryl   . Ace Inhibitors Cough  . Demerol [Meperidine] Nausea And Vomiting  . Neosporin [Neomycin-Bacitracin Zn-Polymyx] Itching, Rash and Other (See Comments)    MAKES REACTIONS WORSE WHEN USING AS PROPHYLACTIC  . Percocet [Oxycodone-Acetaminophen] Rash  . Tape Itching and Rash    Prior to Admission medications   Medication Sig Start Date End Date Taking? Authorizing Provider  albuterol (PROVENTIL HFA) 108 (90 Base) MCG/ACT inhaler Inhale 1-2 puffs into the lungs every 6 (six) hours as needed for wheezing or shortness of breath. 11/07/15   [name], MD  albuterol (PROVENTIL) (2.5 MG/3ML) 0.083% nebulizer solution Take 3 mLs (2.5 mg total)  by nebulization every 6 (six) hours as needed for wheezing or shortness of breath. 08/31/17   [name], PA-C  Alcohol Swabs (B-D SINGLE USE SWABS REGULAR) PADS 1 each by Does not apply route 2 (two) times daily. 08/29/17   Renato Shin, MD  amLODipine (NORVASC) 5 MG tablet Take 1 tablet (5 mg total) by mouth daily. 01/11/18   Regalado, Belkys A, MD  apixaban (ELIQUIS) 5 MG TABS tablet Take 1 tablet (5 mg total) by mouth 2 (two) times daily. 08/17/17   Sueanne Margarita, MD  atorvastatin (LIPITOR) 20 MG tablet Take 1 tablet (20 mg total) by mouth daily. 08/22/17   Sueanne Margarita, MD  Blood  Glucose Calibration (TRUE METRIX LEVEL 1) Low SOLN 1 each by In Vitro route as needed. 08/25/17   Renato Shin, MD  Blood Glucose Monitoring Suppl (TRUE METRIX AIR GLUCOSE METER) w/Device KIT 1 each by Does not apply route daily. 08/25/17   Renato Shin, MD  buPROPion [facility] SR) 150 MG 12 hr tablet Take 150 mg by mouth 2 (two) times daily.    [provider]  fenofibrate (TRICOR) 145 MG tablet Take 1 tablet (145 mg total) by mouth daily. 08/22/17 08/17/18  Sueanne Margarita, MD  ferrous sulfate 325 (65 FE) MG tablet Take 1 tablet (325 mg total) by mouth 2 (two) times daily with a meal. 01/10/18   Regalado, Belkys A, MD  FLUoxetine (PROZAC) 20 MG capsule Take 1 capsule (20 mg total) by mouth at bedtime. 08/31/17   [name], PA-C  furosemide (LASIX) 20 MG tablet Take 3 tablets (60 mg total) by mouth daily. 02/01/18 02/01/19  Sueanne Margarita, MD  gabapentin (NEURONTIN) 300 MG capsule Take 3 capsules (900 mg total) by mouth 2 (two) times daily. TAKE 3 CAPSULES BY MOUTH EVERY  MORNING AND 3 CAPSULES AT BEDTIME Patient taking differently: Take 900 mg by mouth 2 (two) times daily.  08/31/17   [name], PA-C  glucose blood (TRUE METRIX BLOOD GLUCOSE TEST) test strip Used to check blood sugars 2x daily. 08/25/17   Renato Shin, MD  insulin degludec (TRESIBA FLEXTOUCH) 100 UNIT/ML SOPN FlexTouch Pen Inject 0.15 mLs (15 Units total) into the skin daily. 04/03/18   Renato Shin, MD  Insulin Pen Needle 32G X 4 MM MISC Use to inject insulin daily 08/13/16   Renato Shin, MD  Insulin Syringe-Needle U-100 (INSULIN SYRINGE .5CC/31GX5/16") 31G X 5/16" 0.5 ML MISC Use to inject insulin 08/13/16   Renato Shin, MD  magnesium oxide (MAG-OX) 400 (241.3 Mg) MG tablet Take 1 tablet (400 mg total) by mouth daily. Patient taking differently: Take 400 mg by mouth 2 (two) times daily.  10/07/17   [name], PA-C  metFORMIN (GLUCOPHAGE-XR) 500 MG 24 hr tablet Take 4 tablets (2,000 mg total) by mouth daily  with breakfast. Patient taking differently: Take 1,000 mg by mouth 2 (two) times daily.  08/25/17   Renato Shin, MD  methimazole (TAPAZOLE) 10 MG tablet Take 1 tablet (10 mg total) by mouth daily. 08/25/17   Renato Shin, MD  metoprolol succinate (TOPROL-XL) 25 MG 24 hr tablet Take 1 tablet (25 mg total) by mouth 2 (two) times daily. Take with or immediately following a meal. 03/28/18   Turner, Eber Hong, MD  nitroGLYCERIN (NITROSTAT) 0.4 MG SL tablet Place 1 tablet (0.4 mg total) under the tongue every 5 (five) minutes as needed for chest pain. 02/10/17   [name], PA-C  ondansetron (ZOFRAN-ODT) 8  MG disintegrating tablet Take 1 tablet (8 mg total) by mouth every 8 (eight) hours as needed for nausea or vomiting. 02/06/18   Rutherford Guys, MD  pantoprazole (PROTONIX) 40 MG tablet Take 1 tablet (40 mg total) by mouth daily. 08/31/17   [name], PA-C  polyethylene glycol (MIRALAX / GLYCOLAX) packet Take 17 g by mouth daily as needed. 01/10/18   Regalado, Belkys A, MD  potassium chloride SA (K-DUR,KLOR-CON) 20 MEQ tablet Take 1 tablet (20 mEq total) by mouth daily. 01/11/18   Bonnielee Haff, MD  senna (SENOKOT) 8.6 MG TABS tablet Take 1 tablet (8.6 mg total) by mouth at bedtime. 01/11/18   Bonnielee Haff, MD  sotalol (BETAPACE) 120 MG tablet Take 1 tablet (120 mg total) by mouth every 12 (twelve) hours. 08/22/17   Sueanne Margarita, MD  traMADol (ULTRAM) 50 MG tablet Take 50 mg by mouth every 8 (eight) hours as needed for moderate pain or severe pain.  02/02/18   [provider]  triamcinolone cream (KENALOG) 0.1 % Apply 1 application topically 2 (two) times daily. 10/07/17   [name], PA-C  TRUEPLUS LANCETS 28G MISC 1 each by Does not apply route 2 (two) times daily. 08/29/17   Renato Shin, MD    Past Medical History:  Diagnosis Date  . Abnormal EKG 07/31/2013  . Arthritis    "hands" (03/06/2015)  . Asthma   . Carpal tunnel syndrome, bilateral   . Chronic kidney disease   .  Complication of anesthesia    slow to wake up  . Coronary artery disease     2 v CAD with CTO of the RCA and high grade bifurcational LCx/OM stenosis. S/P PCI DES x 2 to the LCx/OM.  Marland Kitchen Diabetic peripheral neuropathy ([place]) "since 1996"  . GERD (gastroesophageal reflux disease)   . Goiter   . Headache    migraines prior to menopause  . History of shingles 06/01/2013  . Hyperlipidemia LDL goal <70 10/13/2015  . Hypertension   . Hyperthyroidism   . PAF (paroxysmal atrial fibrillation) ([place]) 04/29/2015   CHADS2VASC score of 5 now on Apixaban  . Pneumonia ~ 1976  . Tremors of nervous system   . Type II diabetes mellitus (HCC)    insulin dependent    Past Surgical History:  Procedure Laterality Date  . ABDOMINAL HYSTERECTOMY  1988   age 89; CERVICAL DYSPLASIA; ovaries intact.   . AMPUTATION Right 01/23/2016   Procedure: Right 3rd Ray Amputation;  Surgeon: [name], MD;  Location: [place];  Service: Orthopedics;  Laterality: Right;  . AMPUTATION Right 02/13/2016   Procedure: Right Transmetatarsal Amputation;  Surgeon: [name], MD;  Location: [place];  Service: Orthopedics;  Laterality: Right;  . CARDIAC CATHETERIZATION N/A 02/27/2015   Procedure: Left Heart Cath and Coronary Angiography;  Surgeon: [name], MD; LAD 40%, mCFX 80%, OM 70%, RCA 100% calcified       . CARDIAC CATHETERIZATION N/A 03/06/2015   Procedure: Coronary Stent Intervention;  Surgeon: [name], MD;  Location: [place] [facility];  Service: Cardiovascular;  Laterality: N/A;  Mid CX 3.50x12 promus DES w/ 0% resdual and Prox OM1 2.50x20 promus DES w/ 20% residual  . CARDIOVERSION    . CARPAL TUNNEL RELEASE Right Nov 2015  . CARPAL TUNNEL RELEASE Right 1992; 05/2014   [name];   . CESAREAN SECTION  1982; 1984  . FOOT NEUROMA SURGERY Bilateral 2000  . I&D EXTREMITY Left 01/06/2018   Procedure:  DEBRIDEMENT ULCER LEFT FOOT;  Surgeon: [name], MD;  Location: [place];  Service: Orthopedics;   Laterality: Left;  . KNEE ARTHROSCOPY Right ~ 2003   "meniscus repair"  . SHOULDER OPEN ROTATOR CUFF REPAIR Right 1996; 1998   "w/fracture repair"  . THYROID SURGERY  2000   "removed lots of nodules"  . TONSILLECTOMY  1976    Social History   Tobacco Use  . Smoking status: Former Smoker    Packs/day: 0.00    Years: 41.00    Pack years: 0.00    Types: Cigarettes    Last attempt to quit: 03/05/2015    Years since quitting: 3.0  . Smokeless tobacco: Never Used  . Tobacco comment: 04/29/2015 "quit smoking cigarettes 02/27/2015"  Substance Use Topics  . Alcohol use: No    Family History  Problem Relation Age of Onset  . Cancer Mother 33       bronchial cancer  . Breast cancer Mother   . Lung cancer Mother   . Hypertension Father   . COPD Father   . Heart disease Father 18       CAD with cardiac stenting  . Heart attack Father   . Parkinson's disease Father   . Allergies Sister   . Breast cancer Maternal Grandmother   . Emphysema Maternal Grandfather   . Leukemia Paternal Grandmother   . Emphysema Paternal Grandfather   . Thyroid disease Neg Hx     ROS Per hpi  OBJECTIVE: Blood pressure 122/64, pulse 77, temperature (!) 97.4 F (36.3 C), temperature source Oral, height _0  (1.626 m), weight 172 lb 6.4 oz (78.2 kg), SpO2 97 %. Body mass index is 29.59 kg/m.   Physical Exam  Gen: AAOx3, NAD Right foot: distal underside of stump, a small abrasion, no surrounding erythema, non-tender, no drainage   ASSESSMENT and PLAN  1. 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 Continue with monitoring. RTC precautions reviewed.  Return in about 4 weeks (around 05/05/2018).    Rutherford Guys, MD Primary Care at [place], [place] [address] Ph.  [phone] Fax [phone]

## 2018-04-13 ENCOUNTER — Other Ambulatory Visit: Payer: Self-pay

## 2018-04-13 ENCOUNTER — Encounter (INDEPENDENT_AMBULATORY_CARE_PROVIDER_SITE_OTHER): Payer: Self-pay | Admitting: Physician Assistant

## 2018-04-13 ENCOUNTER — Ambulatory Visit (INDEPENDENT_AMBULATORY_CARE_PROVIDER_SITE_OTHER): Payer: Self-pay | Admitting: Physician Assistant

## 2018-04-13 ENCOUNTER — Encounter (HOSPITAL_COMMUNITY): Payer: Self-pay | Admitting: *Deleted

## 2018-04-13 ENCOUNTER — Ambulatory Visit (INDEPENDENT_AMBULATORY_CARE_PROVIDER_SITE_OTHER): Payer: Medicare PPO | Admitting: Orthopedic Surgery

## 2018-04-13 VITALS — Ht 64.0 in | Wt 172.4 lb

## 2018-04-13 DIAGNOSIS — L02611 Cutaneous abscess of right foot: Secondary | ICD-10-CM | POA: Diagnosis not present

## 2018-04-13 DIAGNOSIS — Z89431 Acquired absence of right foot: Secondary | ICD-10-CM | POA: Diagnosis not present

## 2018-04-13 DIAGNOSIS — L97511 Non-pressure chronic ulcer of other part of right foot limited to breakdown of skin: Secondary | ICD-10-CM | POA: Diagnosis not present

## 2018-04-13 DIAGNOSIS — M86171 Other acute osteomyelitis, right ankle and foot: Secondary | ICD-10-CM | POA: Diagnosis not present

## 2018-04-13 NOTE — Progress Notes (Signed)
Spoke with Rulon Eisenmenger, pt's sister for pre-op call. DPR on file. Pt does have a cardiac hx with CAD and A-fib. Cardiologist is Dr. Fransico Him, last office visit in April, 2019. Tye [place] states pt did do a "self cardioversion" this past weekend and has not had any issues since. Pt is a type 2 Diabetic. Last A1C was 7.0 per [facility]. States pt's fasting blood sugar is usually around 110. Instructed her to have pt take 1/2 of her regular dose of Tresiba insulin in the AM, she'll take 7 units and no Metformin in the AM. Instructed her to have pt check her blood sugar when she gets up in the AM and every 2 hours until she leaves for the hospital. If blood sugar is 70 or below, treat with 1/2 cup of clear juice (apple or cranberry) and recheck blood sugar 15 minutes after drinking juice. If blood sugar continues to be 70 or below, call the Short Stay department and ask to speak to a nurse. [name] voiced understanding.

## 2018-04-13 NOTE — Progress Notes (Signed)
Office Visit Note   Patient: Judith [name]           Date of Birth: 1950-07-01           MRN: [id number] Visit Date: 04/13/2018              Requested by: Rutherford Guys, MD [id number] [place], [place] [address] PCP: Rutherford Guys, MD  Chief Complaint  Patient presents with  . Right Shoulder - Routine Post Op  . Right Foot - Pain, Wound Check      HPI: Patient is a 67 year old woman who is status post transmetatarsal amputation on the right.  Patient states that she has been having fever and chills her blood pressure has been 99/68 pulse 137 with a temperature of 100.5.  She states this started about 2 weeks ago.  She was scheduled for right shoulder arthroscopy on December 11 next week.  Patient is on blood pressure medication she uses a CPAP machine she is diabetic and also is on blood thinners.  Assessment & Plan: Visit Diagnoses:  1. Status post transmetatarsal amputation of foot, right ([place])   2. Non-pressure chronic ulcer of other part of right foot limited to breakdown of skin ([place])   3. Cutaneous abscess of right foot   4. Osteomyelitis of foot, right, acute ([place])     Plan: Discussed with the patient with her systemic symptoms the osteomyelitis of the abscess of the transmetatarsal amputation which is very short she does not have options for further foot salvage.  Patient would require a transtibial amputation.  With the osteomyelitis abscess and draining ulcer the transtibial amputation is her only option.  We will plan for surgery tomorrow anticipate discharge to skilled nursing postoperatively.  Risks and benefits were discussed patient states she understands wished to proceed at this time.  Follow-Up Instructions: Return in about 1 week (around 04/20/2018).   Ortho Exam  Patient is alert, oriented, no adenopathy, well-dressed, normal affect, normal respiratory effort. Examination patient has a short transmetatarsal amputation on the right.  She has a large  blister abscess, after informed consent a 10 blade knife was used to unroofed the blister and drained the abscess.  Skin and soft tissue was debrided and excised to decompress the abscess.  Cultures were obtained.  The ulcer probes 3 cm in depth and probes down to bone.  The ulcer is 5 cm in diameter.  Patient does not have ascending cellulitis.  Imaging: No results found. No images are attached to the encounter.  Labs: Lab Results  Component Value Date   HGBA1C 7.9 (A) 04/03/2018   HGBA1C 7.7 (A) 01/30/2018   HGBA1C 8.7 (A) 12/05/2017   ESRSEDRATE 133 (H) 01/02/2018   CRP 23.5 (H) 01/02/2018   REPTSTATUS 01/12/2018 FINAL 01/06/2018   GRAMSTAIN  01/06/2018    MODERATE WBC PRESENT, PREDOMINANTLY PMN RARE GRAM POSITIVE COCCI    CULT  01/06/2018    RARE STREPTOCOCCUS MITIS/ORALIS RARE STAPHYLOCOCCUS SPECIES (COAGULASE NEGATIVE) NO ANAEROBES ISOLATED Performed at [facility], [place] [address] [place],  [address]    LABORGA STREPTOCOCCUS MITIS/ORALIS 01/06/2018     Lab Results  Component Value Date   ALBUMIN 3.5 02/05/2018   ALBUMIN 2.4 (L) 01/05/2018   ALBUMIN 4.2 09/19/2017    Body mass index is 29.59 kg/m.  Orders:  Orders Placed This Encounter  Procedures  . Wound culture  . WOUND CULTURE   No orders of the defined types were placed in this encounter.  Procedures: No procedures performed  Clinical Data: No additional findings.  ROS:  All other systems negative, except as noted in the HPI. Review of Systems  Objective: Vital Signs: Ht 5\' 4"  (1.626 m)   Wt 172 lb 6.4 oz (78.2 kg)   BMI 29.59 kg/m   Specialty Comments:  No specialty comments available.  PMFS History: Patient Active Problem List   Diagnosis Date Noted  . Cutaneous abscess of left foot   . Acute respiratory failure with hypoxia ([place])   . Cellulitis 01/02/2018  . LGI bleed   . Acute blood loss anemia   . Wide-complex tachycardia ([place]) 07/04/2017  . Type II diabetes  mellitus, uncontrolled ([place]) 07/04/2017  . Peripheral neuropathy 07/04/2017  . H/O hyperthyroidism 07/04/2017  . S/P transmetatarsal amputation of foot, right ([place]) 04/19/2016  . Obstructive sleep apnea 11/26/2015  . Bilateral carpal tunnel syndrome 11/26/2015  . Hypomagnesemia 11/16/2015  . Hyperlipidemia LDL goal <70 10/13/2015  . Abnormality of gait 09/02/2015  . Memory loss 08/12/2015  . Diabetic peripheral neuropathy ([place]) 08/12/2015  . Vitamin D deficiency 08/12/2015  . Hyperthyroidism 04/30/2015  . Heme positive stool   . Persistent atrial fibrillation 04/29/2015  . Coronary artery disease with stable angina pectoris ([place]) 03/29/2015  . Abnormal nuclear stress test   . History of goiter 09/28/2014  . GERD (gastroesophageal reflux disease) 07/31/2013  . Depression with anxiety 06/01/2013  . DM2 (diabetes mellitus, type 2) ([place]) 01/30/2013  . HTN (hypertension) 01/30/2013   Past Medical History:  Diagnosis Date  . Abnormal EKG 07/31/2013  . Arthritis    "hands" (03/06/2015)  . Asthma   . Carpal tunnel syndrome, bilateral   . Chronic kidney disease   . Complication of anesthesia    slow to wake up  . Coronary artery disease     2 v CAD with CTO of the RCA and high grade bifurcational LCx/OM stenosis. S/P PCI DES x 2 to the LCx/OM.  Marland Kitchen Diabetic peripheral neuropathy ([place]) "since 1996"  . GERD (gastroesophageal reflux disease)   . Goiter   . Headache    migraines prior to menopause  . History of shingles 06/01/2013  . Hyperlipidemia LDL goal <70 10/13/2015  . Hypertension   . Hyperthyroidism   . PAF (paroxysmal atrial fibrillation) ([place]) 04/29/2015   CHADS2VASC score of 5 now on Apixaban  . Pneumonia ~ 1976  . Sleep apnea    Bipap  . Tremors of nervous system   . Type II diabetes mellitus (HCC)    insulin dependent    Family History  Problem Relation Age of Onset  . Cancer Mother 64       bronchial cancer  . Breast cancer Mother   . Lung cancer Mother   .  Hypertension Father   . COPD Father   . Heart disease Father 41       CAD with cardiac stenting  . Heart attack Father   . Parkinson's disease Father   . Allergies Sister   . Breast cancer Maternal Grandmother   . Emphysema Maternal Grandfather   . Leukemia Paternal Grandmother   . Emphysema Paternal Grandfather   . Thyroid disease Neg Hx     Past Surgical History:  Procedure Laterality Date  . ABDOMINAL HYSTERECTOMY  1988   age 55; CERVICAL DYSPLASIA; ovaries intact.   . AMPUTATION Right 01/23/2016   Procedure: Right 3rd Ray Amputation;  Surgeon: [name], MD;  Location: [place];  Service: Orthopedics;  Laterality: Right;  .  AMPUTATION Right 02/13/2016   Procedure: Right Transmetatarsal Amputation;  Surgeon: [name], MD;  Location: [place];  Service: Orthopedics;  Laterality: Right;  . CARDIAC CATHETERIZATION N/A 02/27/2015   Procedure: Left Heart Cath and Coronary Angiography;  Surgeon: [name], MD; LAD 40%, mCFX 80%, OM 70%, RCA 100% calcified       . CARDIAC CATHETERIZATION N/A 03/06/2015   Procedure: Coronary Stent Intervention;  Surgeon: [name], MD;  Location: [place] [facility];  Service: Cardiovascular;  Laterality: N/A;  Mid CX 3.50x12 promus DES w/ 0% resdual and Prox OM1 2.50x20 promus DES w/ 20% residual  . CARDIOVERSION    . CARPAL TUNNEL RELEASE Right Nov 2015  . CARPAL TUNNEL RELEASE Right 1992; 05/2014   [name]; [place]  . CESAREAN SECTION  1982; 1984  . FOOT NEUROMA SURGERY Bilateral 2000  . I&D EXTREMITY Left 01/06/2018   Procedure: DEBRIDEMENT ULCER LEFT FOOT;  Surgeon: [name], MD;  Location: [place];  Service: Orthopedics;  Laterality: Left;  . KNEE ARTHROSCOPY Right ~ 2003   "meniscus repair"  . SHOULDER OPEN ROTATOR CUFF REPAIR Right 1996; 1998   "w/fracture repair"  . THYROID SURGERY  2000   "removed lots of nodules"  . TONSILLECTOMY  1976   Social History   Occupational History  . Occupation: Landscape architect    Tobacco Use  . Smoking status: Former Smoker    Packs/day: 0.00    Years: 41.00    Pack years: 0.00    Types: Cigarettes    Last attempt to quit: 03/05/2015    Years since quitting: 3.1  . Smokeless tobacco: Never Used  . Tobacco comment: 04/29/2015 "quit smoking cigarettes 02/27/2015"  Substance and Sexual Activity  . Alcohol use: No  . Drug use: No  . Sexual activity: Not Currently    Birth control/protection: Post-menopausal, Surgical

## 2018-04-14 ENCOUNTER — Inpatient Hospital Stay (HOSPITAL_COMMUNITY): Payer: Medicare PPO | Admitting: Anesthesiology

## 2018-04-14 ENCOUNTER — Inpatient Hospital Stay (HOSPITAL_COMMUNITY)
Admission: RE | Admit: 2018-04-14 | Discharge: 2018-04-20 | DRG: 474 | Disposition: A | Discharge status: Home / Self Care | Payer: Medicare PPO | Attending: Internal Medicine | Admitting: Internal Medicine

## 2018-04-14 ENCOUNTER — Encounter (HOSPITAL_COMMUNITY): Payer: Self-pay

## 2018-04-14 ENCOUNTER — Encounter (HOSPITAL_COMMUNITY)
Admission: RE | Disposition: A | Discharge status: Home / Self Care | Payer: Self-pay | Source: Home / Self Care | Attending: Internal Medicine

## 2018-04-14 ENCOUNTER — Other Ambulatory Visit: Payer: Self-pay

## 2018-04-14 DIAGNOSIS — I4891 Unspecified atrial fibrillation: Secondary | ICD-10-CM | POA: Diagnosis present

## 2018-04-14 DIAGNOSIS — R0602 Shortness of breath: Secondary | ICD-10-CM | POA: Diagnosis not present

## 2018-04-14 DIAGNOSIS — S88111A Complete traumatic amputation at level between knee and ankle, right lower leg, initial encounter: Secondary | ICD-10-CM | POA: Diagnosis not present

## 2018-04-14 DIAGNOSIS — K264 Chronic or unspecified duodenal ulcer with hemorrhage: Secondary | ICD-10-CM | POA: Diagnosis not present

## 2018-04-14 DIAGNOSIS — E1142 Type 2 diabetes mellitus with diabetic polyneuropathy: Secondary | ICD-10-CM | POA: Diagnosis present

## 2018-04-14 DIAGNOSIS — I5033 Acute on chronic diastolic (congestive) heart failure: Secondary | ICD-10-CM | POA: Diagnosis not present

## 2018-04-14 DIAGNOSIS — E1151 Type 2 diabetes mellitus with diabetic peripheral angiopathy without gangrene: Secondary | ICD-10-CM | POA: Diagnosis not present

## 2018-04-14 DIAGNOSIS — K269 Duodenal ulcer, unspecified as acute or chronic, without hemorrhage or perforation: Secondary | ICD-10-CM

## 2018-04-14 DIAGNOSIS — K922 Gastrointestinal hemorrhage, unspecified: Secondary | ICD-10-CM | POA: Diagnosis not present

## 2018-04-14 DIAGNOSIS — M86171 Other acute osteomyelitis, right ankle and foot: Secondary | ICD-10-CM | POA: Diagnosis not present

## 2018-04-14 DIAGNOSIS — M86071 Acute hematogenous osteomyelitis, right ankle and foot: Secondary | ICD-10-CM | POA: Diagnosis not present

## 2018-04-14 DIAGNOSIS — Z955 Presence of coronary angioplasty implant and graft: Secondary | ICD-10-CM

## 2018-04-14 DIAGNOSIS — L02611 Cutaneous abscess of right foot: Secondary | ICD-10-CM | POA: Diagnosis not present

## 2018-04-14 DIAGNOSIS — F419 Anxiety disorder, unspecified: Secondary | ICD-10-CM | POA: Diagnosis present

## 2018-04-14 DIAGNOSIS — E11621 Type 2 diabetes mellitus with foot ulcer: Secondary | ICD-10-CM | POA: Diagnosis present

## 2018-04-14 DIAGNOSIS — I480 Paroxysmal atrial fibrillation: Secondary | ICD-10-CM

## 2018-04-14 DIAGNOSIS — E785 Hyperlipidemia, unspecified: Secondary | ICD-10-CM | POA: Diagnosis present

## 2018-04-14 DIAGNOSIS — Z8701 Personal history of pneumonia (recurrent): Secondary | ICD-10-CM

## 2018-04-14 DIAGNOSIS — I5042 Chronic combined systolic (congestive) and diastolic (congestive) heart failure: Secondary | ICD-10-CM | POA: Diagnosis not present

## 2018-04-14 DIAGNOSIS — I2582 Chronic total occlusion of coronary artery: Secondary | ICD-10-CM | POA: Diagnosis present

## 2018-04-14 DIAGNOSIS — K2210 Ulcer of esophagus without bleeding: Secondary | ICD-10-CM | POA: Diagnosis not present

## 2018-04-14 DIAGNOSIS — Z885 Allergy status to narcotic agent status: Secondary | ICD-10-CM

## 2018-04-14 DIAGNOSIS — T8743 Infection of amputation stump, right lower extremity: Secondary | ICD-10-CM | POA: Diagnosis not present

## 2018-04-14 DIAGNOSIS — Z89421 Acquired absence of other right toe(s): Secondary | ICD-10-CM

## 2018-04-14 DIAGNOSIS — I25118 Atherosclerotic heart disease of native coronary artery with other forms of angina pectoris: Secondary | ICD-10-CM | POA: Diagnosis not present

## 2018-04-14 DIAGNOSIS — Z91048 Other nonmedicinal substance allergy status: Secondary | ICD-10-CM

## 2018-04-14 DIAGNOSIS — Z7901 Long term (current) use of anticoagulants: Secondary | ICD-10-CM | POA: Diagnosis not present

## 2018-04-14 DIAGNOSIS — K5730 Diverticulosis of large intestine without perforation or abscess without bleeding: Secondary | ICD-10-CM | POA: Diagnosis present

## 2018-04-14 DIAGNOSIS — J9601 Acute respiratory failure with hypoxia: Secondary | ICD-10-CM | POA: Diagnosis not present

## 2018-04-14 DIAGNOSIS — M19042 Primary osteoarthritis, left hand: Secondary | ICD-10-CM | POA: Diagnosis present

## 2018-04-14 DIAGNOSIS — Z8249 Family history of ischemic heart disease and other diseases of the circulatory system: Secondary | ICD-10-CM

## 2018-04-14 DIAGNOSIS — G4733 Obstructive sleep apnea (adult) (pediatric): Secondary | ICD-10-CM | POA: Diagnosis not present

## 2018-04-14 DIAGNOSIS — M869 Osteomyelitis, unspecified: Secondary | ICD-10-CM | POA: Diagnosis present

## 2018-04-14 DIAGNOSIS — M19041 Primary osteoarthritis, right hand: Secondary | ICD-10-CM | POA: Diagnosis present

## 2018-04-14 DIAGNOSIS — K219 Gastro-esophageal reflux disease without esophagitis: Secondary | ICD-10-CM | POA: Diagnosis not present

## 2018-04-14 DIAGNOSIS — E0590 Thyrotoxicosis, unspecified without thyrotoxic crisis or storm: Secondary | ICD-10-CM | POA: Diagnosis present

## 2018-04-14 DIAGNOSIS — R0902 Hypoxemia: Secondary | ICD-10-CM

## 2018-04-14 DIAGNOSIS — I10 Essential (primary) hypertension: Secondary | ICD-10-CM | POA: Diagnosis present

## 2018-04-14 DIAGNOSIS — N179 Acute kidney failure, unspecified: Secondary | ICD-10-CM | POA: Diagnosis not present

## 2018-04-14 DIAGNOSIS — R413 Other amnesia: Secondary | ICD-10-CM | POA: Diagnosis present

## 2018-04-14 DIAGNOSIS — K5900 Constipation, unspecified: Secondary | ICD-10-CM | POA: Diagnosis present

## 2018-04-14 DIAGNOSIS — N183 Chronic kidney disease, stage 3 (moderate): Secondary | ICD-10-CM | POA: Diagnosis present

## 2018-04-14 DIAGNOSIS — Z89431 Acquired absence of right foot: Secondary | ICD-10-CM

## 2018-04-14 DIAGNOSIS — Z87891 Personal history of nicotine dependence: Secondary | ICD-10-CM | POA: Diagnosis not present

## 2018-04-14 DIAGNOSIS — Z794 Long term (current) use of insulin: Secondary | ICD-10-CM | POA: Diagnosis not present

## 2018-04-14 DIAGNOSIS — F418 Other specified anxiety disorders: Secondary | ICD-10-CM | POA: Diagnosis present

## 2018-04-14 DIAGNOSIS — Z806 Family history of leukemia: Secondary | ICD-10-CM

## 2018-04-14 DIAGNOSIS — I5032 Chronic diastolic (congestive) heart failure: Secondary | ICD-10-CM | POA: Diagnosis not present

## 2018-04-14 DIAGNOSIS — Z4781 Encounter for orthopedic aftercare following surgical amputation: Secondary | ICD-10-CM | POA: Diagnosis not present

## 2018-04-14 DIAGNOSIS — Z79899 Other long term (current) drug therapy: Secondary | ICD-10-CM

## 2018-04-14 DIAGNOSIS — F329 Major depressive disorder, single episode, unspecified: Secondary | ICD-10-CM | POA: Diagnosis present

## 2018-04-14 DIAGNOSIS — Z87410 Personal history of cervical dysplasia: Secondary | ICD-10-CM

## 2018-04-14 DIAGNOSIS — Z801 Family history of malignant neoplasm of trachea, bronchus and lung: Secondary | ICD-10-CM

## 2018-04-14 DIAGNOSIS — E1122 Type 2 diabetes mellitus with diabetic chronic kidney disease: Secondary | ICD-10-CM | POA: Diagnosis present

## 2018-04-14 DIAGNOSIS — E119 Type 2 diabetes mellitus without complications: Secondary | ICD-10-CM | POA: Diagnosis not present

## 2018-04-14 DIAGNOSIS — K921 Melena: Secondary | ICD-10-CM | POA: Diagnosis not present

## 2018-04-14 DIAGNOSIS — Z880 Allergy status to penicillin: Secondary | ICD-10-CM

## 2018-04-14 DIAGNOSIS — R0989 Other specified symptoms and signs involving the circulatory and respiratory systems: Secondary | ICD-10-CM | POA: Diagnosis not present

## 2018-04-14 DIAGNOSIS — M86271 Subacute osteomyelitis, right ankle and foot: Secondary | ICD-10-CM | POA: Diagnosis not present

## 2018-04-14 DIAGNOSIS — Z89511 Acquired absence of right leg below knee: Secondary | ICD-10-CM | POA: Diagnosis not present

## 2018-04-14 DIAGNOSIS — I739 Peripheral vascular disease, unspecified: Secondary | ICD-10-CM | POA: Diagnosis not present

## 2018-04-14 DIAGNOSIS — E1169 Type 2 diabetes mellitus with other specified complication: Secondary | ICD-10-CM | POA: Diagnosis not present

## 2018-04-14 DIAGNOSIS — D62 Acute posthemorrhagic anemia: Secondary | ICD-10-CM | POA: Diagnosis not present

## 2018-04-14 DIAGNOSIS — Z8619 Personal history of other infectious and parasitic diseases: Secondary | ICD-10-CM | POA: Diagnosis not present

## 2018-04-14 DIAGNOSIS — I130 Hypertensive heart and chronic kidney disease with heart failure and stage 1 through stage 4 chronic kidney disease, or unspecified chronic kidney disease: Secondary | ICD-10-CM | POA: Diagnosis present

## 2018-04-14 DIAGNOSIS — W19XXXA Unspecified fall, initial encounter: Secondary | ICD-10-CM | POA: Diagnosis not present

## 2018-04-14 DIAGNOSIS — R7309 Other abnormal glucose: Secondary | ICD-10-CM | POA: Diagnosis not present

## 2018-04-14 DIAGNOSIS — W19XXXD Unspecified fall, subsequent encounter: Secondary | ICD-10-CM | POA: Diagnosis not present

## 2018-04-14 DIAGNOSIS — I70261 Atherosclerosis of native arteries of extremities with gangrene, right leg: Secondary | ICD-10-CM | POA: Diagnosis not present

## 2018-04-14 DIAGNOSIS — I2510 Atherosclerotic heart disease of native coronary artery without angina pectoris: Secondary | ICD-10-CM | POA: Diagnosis not present

## 2018-04-14 DIAGNOSIS — Z803 Family history of malignant neoplasm of breast: Secondary | ICD-10-CM

## 2018-04-14 DIAGNOSIS — Y92230 Patient room in hospital as the place of occurrence of the external cause: Secondary | ICD-10-CM | POA: Diagnosis not present

## 2018-04-14 DIAGNOSIS — G4730 Sleep apnea, unspecified: Secondary | ICD-10-CM | POA: Diagnosis present

## 2018-04-14 DIAGNOSIS — K5901 Slow transit constipation: Secondary | ICD-10-CM | POA: Diagnosis not present

## 2018-04-14 DIAGNOSIS — Z91041 Radiographic dye allergy status: Secondary | ICD-10-CM

## 2018-04-14 DIAGNOSIS — E049 Nontoxic goiter, unspecified: Secondary | ICD-10-CM | POA: Diagnosis present

## 2018-04-14 DIAGNOSIS — E039 Hypothyroidism, unspecified: Secondary | ICD-10-CM | POA: Diagnosis present

## 2018-04-14 DIAGNOSIS — G8918 Other acute postprocedural pain: Secondary | ICD-10-CM

## 2018-04-14 DIAGNOSIS — Z888 Allergy status to other drugs, medicaments and biological substances status: Secondary | ICD-10-CM

## 2018-04-14 DIAGNOSIS — Z90710 Acquired absence of both cervix and uterus: Secondary | ICD-10-CM

## 2018-04-14 HISTORY — DX: Osteomyelitis, unspecified: M86.9

## 2018-04-14 HISTORY — DX: Sleep apnea, unspecified: G47.30

## 2018-04-14 LAB — BASIC METABOLIC PANEL
Anion gap: 15 (ref 5–15)
BUN: 19 mg/dL (ref 8–23)
CO2: 23 mmol/L (ref 22–32)
Calcium: 8.2 mg/dL — ABNORMAL LOW (ref 8.9–10.3)
Chloride: 92 mmol/L — ABNORMAL LOW (ref 98–111)
Creatinine, Ser: 1.16 mg/dL — ABNORMAL HIGH (ref 0.44–1.00)
GFR calc Af Amer: 57 mL/min — ABNORMAL LOW (ref >60)
GFR calc non Af Amer: 49 mL/min — ABNORMAL LOW (ref >60)
Glucose, Bld: 197 mg/dL — ABNORMAL HIGH (ref 70–99)
Potassium: 2.8 mmol/L — ABNORMAL LOW (ref 3.5–5.1)
Sodium: 130 mmol/L — ABNORMAL LOW (ref 135–145)

## 2018-04-14 LAB — GLUCOSE, CAPILLARY
Glucose-Capillary: 203 mg/dL — ABNORMAL HIGH (ref 70–99)
Glucose-Capillary: 162 mg/dL — ABNORMAL HIGH (ref 70–99)

## 2018-04-14 LAB — MRSA PCR SCREENING: MRSA by PCR: NEGATIVE

## 2018-04-14 LAB — CBC
HEMATOCRIT: 29 % — AB (ref 36.0–46.0)
Hemoglobin: 8.8 g/dL — ABNORMAL LOW (ref 12.0–15.0)
MCH: 26.6 pg (ref 26.0–34.0)
MCHC: 30.3 g/dL (ref 30.0–36.0)
MCV: 87.6 fL (ref 80.0–100.0)
Platelets: 367 10*3/uL (ref 150–400)
RBC: 3.31 MIL/uL — ABNORMAL LOW (ref 3.87–5.11)
RDW: 14.6 % (ref 11.5–15.5)
WBC: 8.7 10*3/uL (ref 4.0–10.5)
nRBC: 0 % (ref 0.0–0.2)

## 2018-04-14 LAB — HEPARIN LEVEL (UNFRACTIONATED): Heparin Unfractionated: 1.8 IU/mL — ABNORMAL HIGH (ref 0.30–0.70)

## 2018-04-14 LAB — HEMOGLOBIN A1C
Hgb A1c MFr Bld: 8 % — ABNORMAL HIGH (ref 4.8–5.6)
Mean Plasma Glucose: 182.9 mg/dL

## 2018-04-14 LAB — APTT
aPTT: 42 seconds — ABNORMAL HIGH (ref 24–36)
aPTT: 55 seconds — ABNORMAL HIGH (ref 24–36)

## 2018-04-14 LAB — PROTIME-INR
INR: 1.38
Prothrombin Time: 16.8 seconds — ABNORMAL HIGH (ref 11.4–15.2)

## 2018-04-14 SURGERY — AMPUTATION BELOW KNEE
Anesthesia: General | Laterality: Right

## 2018-04-14 MED ORDER — METOPROLOL SUCCINATE ER 25 MG PO TB24
25.0000 mg | ORAL_TABLET | ORAL | Status: AC
  Administered 2018-04-14: 25 mg via ORAL

## 2018-04-14 MED ORDER — FENTANYL CITRATE (PF) 100 MCG/2ML IJ SOLN
INTRAMUSCULAR | Status: AC
  Filled 2018-04-14: qty 2

## 2018-04-14 MED ORDER — VANCOMYCIN HCL 10 G IV SOLR
1500.0000 mg | Freq: Once | INTRAVENOUS | Status: AC
  Administered 2018-04-14: 1500 mg via INTRAVENOUS
  Filled 2018-04-14: qty 1500

## 2018-04-14 MED ORDER — ALBUTEROL SULFATE (2.5 MG/3ML) 0.083% IN NEBU
2.5000 mg | INHALATION_SOLUTION | Freq: Four times a day (QID) | RESPIRATORY_TRACT | Status: DC | PRN
  Administered 2018-04-15 – 2018-04-16 (×2): 2.5 mg via RESPIRATORY_TRACT
  Filled 2018-04-14 (×2): qty 3

## 2018-04-14 MED ORDER — CHLORHEXIDINE GLUCONATE 4 % EX LIQD: 60.0000 mL | Freq: Once | CUTANEOUS | Status: DC

## 2018-04-14 MED ORDER — INSULIN GLARGINE 100 UNIT/ML ~~LOC~~ SOLN
20.0000 [IU] | Freq: Every day | SUBCUTANEOUS | Status: DC
  Administered 2018-04-14 – 2018-04-16 (×3): 20 [IU] via SUBCUTANEOUS
  Filled 2018-04-14 (×3): qty 0.2

## 2018-04-14 MED ORDER — HEPARIN (PORCINE) 25000 UT/250ML-% IV SOLN
1250.0000 [IU]/h | INTRAVENOUS | Status: DC
  Administered 2018-04-14: 1000 [IU]/h via INTRAVENOUS
  Filled 2018-04-14: qty 250

## 2018-04-14 MED ORDER — ATORVASTATIN CALCIUM 40 MG PO TABS
20.0000 mg | ORAL_TABLET | Freq: Every evening | ORAL | Status: DC
  Administered 2018-04-14 – 2018-04-19 (×6): 20 mg via ORAL
  Filled 2018-04-14 (×6): qty 1

## 2018-04-14 MED ORDER — ONDANSETRON HCL 4 MG PO TABS: 4.0000 mg | ORAL_TABLET | Freq: Four times a day (QID) | ORAL | Status: DC | PRN

## 2018-04-14 MED ORDER — AMLODIPINE BESYLATE 5 MG PO TABS
5.0000 mg | ORAL_TABLET | Freq: Every day | ORAL | Status: DC
  Administered 2018-04-14: 5 mg via ORAL
  Filled 2018-04-14 (×2): qty 1

## 2018-04-14 MED ORDER — DILTIAZEM HCL-DEXTROSE 100-5 MG/100ML-% IV SOLN (PREMIX)
5.0000 mg/h | INTRAVENOUS | Status: DC
  Administered 2018-04-14 – 2018-04-15 (×2): 10 mg/h via INTRAVENOUS
  Filled 2018-04-14 (×3): qty 100

## 2018-04-14 MED ORDER — CHLORHEXIDINE GLUCONATE 4 % EX LIQD
60.0000 mL | Freq: Once | CUTANEOUS | Status: AC
  Administered 2018-04-15: 4 via TOPICAL
  Filled 2018-04-14: qty 60

## 2018-04-14 MED ORDER — MIDAZOLAM HCL 2 MG/2ML IJ SOLN
INTRAMUSCULAR | Status: AC
  Filled 2018-04-14: qty 2

## 2018-04-14 MED ORDER — SOTALOL HCL 120 MG PO TABS
120.0000 mg | ORAL_TABLET | ORAL | Status: AC
  Administered 2018-04-14: 120 mg via ORAL
  Filled 2018-04-14: qty 1

## 2018-04-14 MED ORDER — GABAPENTIN 300 MG PO CAPS
900.0000 mg | ORAL_CAPSULE | Freq: Two times a day (BID) | ORAL | Status: DC
  Administered 2018-04-14 – 2018-04-20 (×12): 900 mg via ORAL
  Filled 2018-04-14 (×12): qty 3

## 2018-04-14 MED ORDER — KETOROLAC TROMETHAMINE 15 MG/ML IJ SOLN
15.0000 mg | Freq: Four times a day (QID) | INTRAMUSCULAR | Status: DC | PRN
  Administered 2018-04-14: 15 mg via INTRAVENOUS
  Filled 2018-04-14: qty 1

## 2018-04-14 MED ORDER — METHIMAZOLE 10 MG PO TABS
10.0000 mg | ORAL_TABLET | Freq: Every day | ORAL | Status: DC
  Administered 2018-04-14 – 2018-04-20 (×7): 10 mg via ORAL
  Filled 2018-04-14 (×7): qty 1

## 2018-04-14 MED ORDER — METOPROLOL SUCCINATE ER 25 MG PO TB24
25.0000 mg | ORAL_TABLET | Freq: Two times a day (BID) | ORAL | Status: DC
  Administered 2018-04-14: 25 mg via ORAL
  Filled 2018-04-14: qty 1

## 2018-04-14 MED ORDER — POTASSIUM CHLORIDE IN NACL 20-0.9 MEQ/L-% IV SOLN
INTRAVENOUS | Status: AC
  Administered 2018-04-14: 16:00:00 via INTRAVENOUS
  Filled 2018-04-14 (×2): qty 1000

## 2018-04-14 MED ORDER — FLUOXETINE HCL 20 MG PO CAPS
20.0000 mg | ORAL_CAPSULE | Freq: Every day | ORAL | Status: DC
  Administered 2018-04-14 – 2018-04-19 (×6): 20 mg via ORAL
  Filled 2018-04-14 (×6): qty 1

## 2018-04-14 MED ORDER — DILTIAZEM HCL-DEXTROSE 100-5 MG/100ML-% IV SOLN (PREMIX)
5.0000 mg/h | INTRAVENOUS | Status: AC
  Filled 2018-04-14: qty 100

## 2018-04-14 MED ORDER — FENTANYL CITRATE (PF) 100 MCG/2ML IJ SOLN
50.0000 ug | INTRAMUSCULAR | Status: DC | PRN
  Administered 2018-04-14 – 2018-04-16 (×2): 50 ug via INTRAVENOUS
  Filled 2018-04-14 (×2): qty 2

## 2018-04-14 MED ORDER — FUROSEMIDE 40 MG PO TABS
60.0000 mg | ORAL_TABLET | Freq: Every day | ORAL | Status: DC
  Administered 2018-04-14 – 2018-04-15 (×2): 60 mg via ORAL
  Filled 2018-04-14 (×2): qty 1

## 2018-04-14 MED ORDER — METOPROLOL SUCCINATE ER 25 MG PO TB24
ORAL_TABLET | ORAL | Status: AC
  Filled 2018-04-14: qty 1

## 2018-04-14 MED ORDER — FENTANYL CITRATE (PF) 250 MCG/5ML IJ SOLN
INTRAMUSCULAR | Status: AC
  Filled 2018-04-14: qty 5

## 2018-04-14 MED ORDER — INSULIN DEGLUDEC 100 UNIT/ML ~~LOC~~ SOPN: 20.0000 [IU] | PEN_INJECTOR | Freq: Every evening | SUBCUTANEOUS | Status: DC

## 2018-04-14 MED ORDER — POTASSIUM CHLORIDE CRYS ER 20 MEQ PO TBCR
20.0000 meq | EXTENDED_RELEASE_TABLET | Freq: Every day | ORAL | Status: DC
  Administered 2018-04-14 – 2018-04-20 (×7): 20 meq via ORAL
  Filled 2018-04-14 (×7): qty 1

## 2018-04-14 MED ORDER — FENOFIBRATE 160 MG PO TABS
160.0000 mg | ORAL_TABLET | Freq: Every day | ORAL | Status: DC
  Administered 2018-04-15 – 2018-04-20 (×6): 160 mg via ORAL
  Filled 2018-04-14 (×6): qty 1

## 2018-04-14 MED ORDER — ONDANSETRON 4 MG PO TBDP: 8.0000 mg | ORAL_TABLET | Freq: Three times a day (TID) | ORAL | Status: DC | PRN

## 2018-04-14 MED ORDER — CLINDAMYCIN PHOSPHATE 900 MG/50ML IV SOLN
900.0000 mg | INTRAVENOUS | Status: AC
  Filled 2018-04-14: qty 50

## 2018-04-14 MED ORDER — LACTATED RINGERS IV SOLN
INTRAVENOUS | Status: DC
  Administered 2018-04-14: 11:00:00 via INTRAVENOUS

## 2018-04-14 MED ORDER — BUPROPION HCL ER (SR) 150 MG PO TB12
150.0000 mg | ORAL_TABLET | Freq: Two times a day (BID) | ORAL | Status: DC
  Administered 2018-04-14 – 2018-04-20 (×12): 150 mg via ORAL
  Filled 2018-04-14 (×15): qty 1

## 2018-04-14 MED ORDER — PANTOPRAZOLE SODIUM 40 MG PO TBEC: 40.0000 mg | DELAYED_RELEASE_TABLET | Freq: Every day | ORAL | Status: DC

## 2018-04-14 MED ORDER — SOTALOL HCL 120 MG PO TABS
120.0000 mg | ORAL_TABLET | Freq: Two times a day (BID) | ORAL | Status: DC
  Administered 2018-04-14 – 2018-04-20 (×12): 120 mg via ORAL
  Filled 2018-04-14 (×13): qty 1

## 2018-04-14 MED ORDER — VANCOMYCIN HCL IN DEXTROSE 1-5 GM/200ML-% IV SOLN: 1000.0000 mg | Freq: Once | INTRAVENOUS | Status: DC

## 2018-04-14 MED ORDER — ONDANSETRON HCL 4 MG/2ML IJ SOLN
INTRAMUSCULAR | Status: AC
  Filled 2018-04-14: qty 2

## 2018-04-14 MED ORDER — CEFAZOLIN SODIUM-DEXTROSE 2-4 GM/100ML-% IV SOLN
2.0000 g | INTRAVENOUS | Status: AC
  Administered 2018-04-15: 2 g via INTRAVENOUS
  Filled 2018-04-14: qty 100

## 2018-04-14 MED ORDER — ALBUTEROL SULFATE HFA 108 (90 BASE) MCG/ACT IN AERS: 1.0000 | INHALATION_SPRAY | Freq: Four times a day (QID) | RESPIRATORY_TRACT | Status: DC | PRN

## 2018-04-14 MED ORDER — NITROGLYCERIN 0.4 MG SL SUBL: 0.4000 mg | SUBLINGUAL_TABLET | SUBLINGUAL | Status: DC | PRN

## 2018-04-14 MED ORDER — VANCOMYCIN HCL IN DEXTROSE 750-5 MG/150ML-% IV SOLN
750.0000 mg | INTRAVENOUS | Status: DC
  Administered 2018-04-15: 750 mg via INTRAVENOUS
  Filled 2018-04-14 (×2): qty 150

## 2018-04-14 MED ORDER — ONDANSETRON HCL 4 MG/2ML IJ SOLN
4.0000 mg | Freq: Four times a day (QID) | INTRAMUSCULAR | Status: DC | PRN
  Administered 2018-04-14: 4 mg via INTRAVENOUS
  Filled 2018-04-14: qty 2

## 2018-04-14 NOTE — Progress Notes (Signed)
Admitting physician, Dr. Tobias Alexander, and the family at the bedside.

## 2018-04-14 NOTE — H&P (View-Only) (Signed)
Patient ID: Judith [name], female   DOB: 1950/09/22, 67 y.o.   MRN: [id number] Surgery canceled today to stabilize patient medically.  We will plan for transtibial amputation tomorrow Saturday morning.

## 2018-04-14 NOTE — Progress Notes (Signed)
Paged Dr. Evangeline Gula about cardizem drip.  Dr. Evangeline Gula mentioned cardizem drip when she was at bedside but have not seen an order.  Awaiting call back.

## 2018-04-14 NOTE — Progress Notes (Signed)
Upon completing the pt's pre-op, pt placed on the cardiac monitor for set-up of a pre-op procedure. Pt's heart rate irregular at 163 bmp. Anesthesiologist Dr. Vladimir Crofts made aware. EKG performed with Dr. Tobias Alexander at the bedside. At the time of the finding, pt has no complaints of chest pain or sob.

## 2018-04-14 NOTE — Progress Notes (Signed)
Pharmacy Antibiotic Note  Judith [name] is a 67 y.o. female admitted on 04/14/2018 with planned transmetatarsal amputation revision planned for osteomyelitis now admitted with nausea. Pharmacy has been consulted for vancomycin dosing. SCr 1.16, dosing will provide estimated AUC of ~450 mcg*h/ml.   Plan: Vancomycin 1500mg  IV x1 then 750mg  IV q24h Monitor LOT, cultures, renal funx Vancomycin peak/trough as needed Follow-up surgical plans  Height: 5' 3.5" (161.3 cm) Weight: 160 lb (72.6 kg) IBW/kg (Calculated) : 53.55  Temp (24hrs), Avg:99.7 F (37.6 C), Min:99 F (37.2 C), Max:100 F (37.8 C)  Recent Labs  Lab 04/14/18 1106  WBC 8.7  CREATININE 1.16*    Estimated Creatinine Clearance: 46.7 mL/min (A) (by C-G formula based on SCr of 1.16 mg/dL (H)).    Allergies  Allergen Reactions  . Contrast Media [Iodinated Diagnostic Agents] Hives and Other (See Comments)    Spoke to patient, Iodine allergy is really IV contrast allergy.   [name] [Insulin Aspart] Shortness Of Breath and Other (See Comments)    "breathing problems"  . Propofol Shortness Of Breath    "Breathing problems - asthma attack" Can take with benadryl   . Codeine Nausea And Vomiting and Other (See Comments)    HIGH DOSES-SEVERE VOMITING  . Iodine Other (See Comments)    MUST HAVE BENADRYL PRIOR TO PROCEDURE AND RIGHT BEFORE TREATMENT TO COUNTERACT REACTION-BLISTERING REACTION DERMATOLOGICAL  . Penicillins Itching, Rash and Other (See Comments)    Has patient had a PCN reaction causing immediate rash, facial/tongue/throat swelling, SOB or lightheadedness with hypotension: no Has patient had a PCN reaction causing severe rash involving mucus membranes or skin necrosis: No Has patient had a PCN reaction that required hospitalization No Has patient had a PCN reaction occurring within the last 10 years: No If all of the above answers are "NO", then may proceed with Cephalosporin use.  CHEST SIZED RASH AND  ITCHING   . Ace Inhibitors Cough  . Demerol [Meperidine] Nausea And Vomiting  . Dilaudid [Hydromorphone Hcl] Other (See Comments)    HEADACHE   . Neosporin [Neomycin-Bacitracin Zn-Polymyx] Itching, Rash and Other (See Comments)    MAKES REACTIONS WORSE WHEN USING AS PROPHYLACTIC  . Percocet [Oxycodone-Acetaminophen] Rash  . Tape Itching and Rash    Antimicrobials this admission: Vancomycin 12/6 >>   Dose adjustments this admission: none  Microbiology results: none  Thank you for allowing pharmacy to be a part of this patient's care.  Arrie Senate, PharmD, BCPS Clinical Pharmacist [phone] Please check AMION for all [facility] Pharmacy numbers 04/14/2018

## 2018-04-14 NOTE — Progress Notes (Signed)
Dr. Evangeline Gula made aware of pt's cardiac monitor alarm stating an abnormal rhythm.

## 2018-04-14 NOTE — H&P (Signed)
History and Physical    Judith [name]:[id number] DOB: 1951/01/17 DOA: 04/14/2018  PCP: Judith Guys, MD   Patient coming from: Home  I have personally briefly reviewed patient's old medical records in [place]  Chief Complaint: Atrial fibrillation with rapid ventricular response  HPI: Judith [name] is a 67 y.o. female with medical history significant of osteomyelitis of the right foot, chronic kidney disease, coronary artery disease, diabetes mellitus with peripheral neuropathy, paroxysmal atrial fibrillation, sleep apnea who presented today for surgery on her right foot due to osteomyelitis.  She has been complaining of fevers and chills with lowish blood pressures at home.  She saw Dr. Sharol Given who felt that she needed a likely revision of her transmetatarsal amputation due to osteomyelitis.  Surgery was planned for today but unfortunately on Wednesday the patient's condition worsened and she developed nausea she felt probably related to the infection.  She does not believe that she had a stomach virus.  Since Wednesday she has been unable to take any of her rate control medications and presented today to the Surgical suite with a heart rate in the 140s.  After seeing her from anesthesiology called me to evaluate the patient and consider admission. The patient states she does not know when she goes into atrial fibrillation.  Sometimes she gets a funny feeling in her chest.  This happened on Sunday she has a iPhone EKG monitor she attached it to her phone and was found to be in atrial fibrillation but spontaneously resolved at home on Sunday night.  Unfortunately today she had no premonitory symptoms and had no idea she was in atrial fibrillation when she presented.  Surgery was canceled and I was consulted for further evaluation and monitoring. Patient denies any chest pain, palpitations, or shortness of breath.  Not have the discomfort she can sometimes sense when she goes into atrial  fibrillation   Review of Systems: As per HPI otherwise all other systems reviewed and  negative.    Past Medical History:  Diagnosis Date  . Abnormal EKG 07/31/2013  . Arthritis    "hands" (03/06/2015)  . Asthma   . Carpal tunnel syndrome, bilateral   . Chronic kidney disease   . Complication of anesthesia    slow to wake up. Pt sts she woke up during surgery many years ago.  . Coronary artery disease     2 v CAD with CTO of the RCA and high grade bifurcational LCx/OM stenosis. S/P PCI DES x 2 to the LCx/OM.  Marland Kitchen Diabetic peripheral neuropathy ([place]) "since 1996"  . GERD (gastroesophageal reflux disease)   . Goiter   . Headache    migraines prior to menopause  . History of shingles 06/01/2013  . Hyperlipidemia LDL goal <70 10/13/2015  . Hypertension   . Hyperthyroidism   . Osteomyelitis of foot (HCC)    Right  . PAF (paroxysmal atrial fibrillation) ([place]) 04/29/2015   CHADS2VASC score of 5 now on Apixaban  . Pneumonia ~ 1976  . Sleep apnea    Bipap  . Tremors of nervous system   . Type II diabetes mellitus (HCC)    insulin dependent    Past Surgical History:  Procedure Laterality Date  . ABDOMINAL HYSTERECTOMY  1988   age 66; CERVICAL DYSPLASIA; ovaries intact.   . AMPUTATION Right 01/23/2016   Procedure: Right 3rd Ray Amputation;  Surgeon: [name], MD;  Location: [place];  Service: Orthopedics;  Laterality: Right;  . AMPUTATION  Right 02/13/2016   Procedure: Right Transmetatarsal Amputation;  Surgeon: [name], MD;  Location: [place];  Service: Orthopedics;  Laterality: Right;  . CARDIAC CATHETERIZATION N/A 02/27/2015   Procedure: Left Heart Cath and Coronary Angiography;  Surgeon: [name], MD; LAD 40%, mCFX 80%, OM 70%, RCA 100% calcified       . CARDIAC CATHETERIZATION N/A 03/06/2015   Procedure: Coronary Stent Intervention;  Surgeon: [name], MD;  Location: [facility];  Service: Cardiovascular;  Laterality: N/A;  Mid CX 3.50x12 promus DES w/ 0%  resdual and Prox OM1 2.50x20 promus DES w/ 20% residual  . CARDIOVERSION    . CARPAL TUNNEL RELEASE Right Nov 2015  . CARPAL TUNNEL RELEASE Right 1992; 05/2014   [name]; [place]  . CESAREAN SECTION  1982; 1984  . FOOT NEUROMA SURGERY Bilateral 2000  . I&D EXTREMITY Left 01/06/2018   Procedure: DEBRIDEMENT ULCER LEFT FOOT;  Surgeon: [name], MD;  Location: [place];  Service: Orthopedics;  Laterality: Left;  . KNEE ARTHROSCOPY Right ~ 2003   "meniscus repair"  . SHOULDER OPEN ROTATOR CUFF REPAIR Right 1996; 1998   "w/fracture repair"  . THYROID SURGERY  2000   "removed lots of nodules"  . 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      Lives with sister in [place].      Tobacco: 1 ppd x 41 years - quit 2016      Alcohol: none      Drugs: none      Exercise:  Walking in neighborhood; physical job.   Right-handed.   2 cups caffeine daily.     reports that she quit smoking about 3 years ago. Her smoking use included cigarettes. She smoked 0.00 packs per day for 41.00 years. She has never used smokeless tobacco. She reports that she does not drink alcohol or use drugs.  Allergies  Allergen Reactions  . Contrast Media [Iodinated Diagnostic Agents] Hives and Other (See Comments)    Spoke to patient, Iodine allergy is really IV contrast allergy.   [name] [Insulin Aspart] Shortness Of Breath and Other (See Comments)    "breathing problems"  . Propofol Shortness Of Breath    "Breathing problems - asthma attack" Can take with benadryl   . Codeine Nausea And Vomiting and Other (See Comments)    HIGH DOSES-SEVERE VOMITING  . Iodine Other (See Comments)    MUST HAVE BENADRYL PRIOR TO PROCEDURE AND RIGHT BEFORE TREATMENT TO  COUNTERACT REACTION-BLISTERING REACTION DERMATOLOGICAL  . Penicillins Itching, Rash and Other (See Comments)    Has patient had a PCN reaction causing immediate rash, facial/tongue/throat swelling, SOB or lightheadedness with hypotension: no Has patient had a PCN reaction causing severe rash involving mucus membranes or skin necrosis: No Has patient had a PCN reaction that required hospitalization No Has patient had a PCN reaction occurring within the last 10 years: No If all of the above answers are "NO", then may proceed with Cephalosporin use.  CHEST SIZED RASH AND ITCHING   . Ace Inhibitors Cough  . Demerol [Meperidine] Nausea And Vomiting  . Dilaudid [Hydromorphone Hcl] Other (See Comments)    HEADACHE   . Neosporin [Neomycin-Bacitracin Zn-Polymyx]  Itching, Rash and Other (See Comments)    MAKES REACTIONS WORSE WHEN USING AS PROPHYLACTIC  . Percocet [Oxycodone-Acetaminophen] Rash  . Tape Itching and Rash    Family History  Problem Relation Age of Onset  . Cancer Mother 78       bronchial cancer  . Breast cancer Mother   . Lung cancer Mother   . Hypertension Father   . COPD Father   . Heart disease Father 23       CAD with cardiac stenting  . Heart attack Father   . Parkinson's disease Father   . Allergies Sister   . Breast cancer Maternal Grandmother   . Emphysema Maternal Grandfather   . Leukemia Paternal Grandmother   . Emphysema Paternal Grandfather   . Thyroid disease Neg Hx      Prior to Admission medications   Medication Sig Start Date End Date Taking? Authorizing Provider  Alcohol Swabs (B-D SINGLE USE SWABS REGULAR) PADS 1 each by Does not apply route 2 (two) times daily. 08/29/17  Yes Renato Shin, MD  amLODipine (NORVASC) 5 MG tablet Take 1 tablet (5 mg total) by mouth daily. 01/11/18  Yes Regalado, Belkys A, MD  apixaban (ELIQUIS) 5 MG TABS tablet Take 1 tablet (5 mg total) by mouth 2 (two) times daily. 08/17/17  Yes Turner, Eber Hong, MD  atorvastatin  (LIPITOR) 20 MG tablet Take 1 tablet (20 mg total) by mouth daily. Patient taking differently: Take 20 mg by mouth every evening.  08/22/17  Yes Turner, Eber Hong, MD  Blood Glucose Calibration (TRUE METRIX LEVEL 1) Low SOLN 1 each by In Vitro route as needed. 08/25/17  Yes Renato Shin, MD  Blood Glucose Monitoring Suppl (TRUE METRIX AIR GLUCOSE METER) w/Device KIT 1 each by Does not apply route daily. 08/25/17  Yes Renato Shin, MD  buPROPion [facility] SR) 150 MG 12 hr tablet Take 150 mg by mouth 2 (two) times daily.   Yes [provider]  fenofibrate (TRICOR) 145 MG tablet Take 1 tablet (145 mg total) by mouth daily. Patient taking differently: Take 145 mg by mouth every evening.  08/22/17 08/17/18 Yes Turner, Eber Hong, MD  FLUoxetine (PROZAC) 20 MG capsule Take 1 capsule (20 mg total) by mouth at bedtime. 08/31/17  Yes [name], PA-C  furosemide (LASIX) 20 MG tablet Take 3 tablets (60 mg total) by mouth daily. 02/01/18 02/01/19 Yes Turner, Eber Hong, MD  gabapentin (NEURONTIN) 300 MG capsule Take 3 capsules (900 mg total) by mouth 2 (two) times daily. TAKE 3 CAPSULES BY MOUTH EVERY  MORNING AND 3 CAPSULES AT BEDTIME Patient taking differently: Take 900 mg by mouth 2 (two) times daily.  08/31/17  Yes [name], PA-C  glucose blood (TRUE METRIX BLOOD GLUCOSE TEST) test strip Used to check blood sugars 2x daily. 08/25/17  Yes Renato Shin, MD  insulin degludec (TRESIBA FLEXTOUCH) 100 UNIT/ML SOPN FlexTouch Pen Inject 0.15 mLs (15 Units total) into the skin daily. Patient taking differently: Inject 20 Units into the skin every evening.  04/03/18  Yes Renato Shin, MD  Insulin Pen Needle 32G X 4 MM MISC Use to inject insulin daily 08/13/16  Yes Renato Shin, MD  Insulin Syringe-Needle U-100 (INSULIN SYRINGE .5CC/31GX5/16") 31G X 5/16" 0.5 ML MISC Use to inject insulin 08/13/16  Yes Renato Shin, MD  metFORMIN (GLUCOPHAGE-XR) 500 MG 24 hr tablet Take 4 tablets (2,000 mg total) by mouth  daily with breakfast. Patient taking differently: Take 1,000 mg by mouth  2 (two) times daily.  08/25/17  Yes Renato Shin, MD  methimazole (TAPAZOLE) 10 MG tablet Take 1 tablet (10 mg total) by mouth daily. 08/25/17  Yes Renato Shin, MD  metoprolol succinate (TOPROL-XL) 25 MG 24 hr tablet Take 1 tablet (25 mg total) by mouth 2 (two) times daily. Take with or immediately following a meal. 03/28/18  Yes Turner, Traci R, MD  pantoprazole (PROTONIX) 40 MG tablet Take 1 tablet (40 mg total) by mouth daily. 08/31/17  Yes [name], PA-C  potassium chloride SA (K-DUR,KLOR-CON) 20 MEQ tablet Take 1 tablet (20 mEq total) by mouth daily. 01/11/18  Yes Bonnielee Haff, MD  sotalol (BETAPACE) 120 MG tablet Take 1 tablet (120 mg total) by mouth every 12 (twelve) hours. 08/22/17  Yes Turner, Eber Hong, MD  traMADol (ULTRAM) 50 MG tablet Take 50 mg by mouth every 8 (eight) hours as needed for moderate pain or severe pain.  02/02/18  Yes [provider]  TRUEPLUS LANCETS 28G MISC 1 each by Does not apply route 2 (two) times daily. 08/29/17  Yes Renato Shin, MD  albuterol (PROVENTIL HFA) 108 (90 Base) MCG/ACT inhaler Inhale 1-2 puffs into the lungs every 6 (six) hours as needed for wheezing or shortness of breath. 11/07/15   [name], MD  albuterol (PROVENTIL) (2.5 MG/3ML) 0.083% nebulizer solution Take 3 mLs (2.5 mg total) by nebulization every 6 (six) hours as needed for wheezing or shortness of breath. 08/31/17   [name], PA-C  nitroGLYCERIN (NITROSTAT) 0.4 MG SL tablet Place 1 tablet (0.4 mg total) under the tongue every 5 (five) minutes as needed for chest pain. 02/10/17   [name], PA-C  ondansetron (ZOFRAN-ODT) 8 MG disintegrating tablet Take 1 tablet (8 mg total) by mouth every 8 (eight) hours as needed for nausea or vomiting. 02/06/18   Judith Guys, MD  triamcinolone cream (KENALOG) 0.1 % Apply 1 application topically 2 (two) times daily. Patient taking differently: Apply 1  application topically 2 (two) times daily as needed (for skin rash/irritation.).  10/07/17   [name], PA-C    Physical Exam:  Constitutional: NAD, calm, comfortable, ill-appearing Vitals:   04/14/18 1148 04/14/18 1150 04/14/18 1205 04/14/18 1210  BP: 134/83 130/84 (!) 139/113   Pulse: 84 (!) 159 (!) 143 (!) 138  Resp: 16 13 (!) 23 18  Temp:      TempSrc:      SpO2: 97% 99% 99% 99%  Weight:      Height:       Eyes: PERRL, lids and conjunctivae normal ENMT: Mucous membranes are moist. Posterior pharynx clear of any exudate or lesions.Normal dentition.  Neck: normal, supple, no masses, no thyromegaly Respiratory: clear to auscultation bilaterally, no wheezing, no crackles. Normal respiratory effort. No accessory muscle use.  Cardiovascular: Irregularly irregular rate and rhythm, no murmurs / rubs / gallops. No extremity edema. 2+ pedal pulses. No carotid bruits.  Abdomen: no tenderness, no masses palpated. No hepatosplenomegaly. Bowel sounds positive.  Musculoskeletal: no clubbing / cyanosis. No joint deformity upper and lower extremities. Good ROM, no contractures. Normal muscle tone.  Skin: no rashes, lesions, ulcers. No induration, macerated and ulcerated area of the right transmetatarsal amputation, lymphangitic erythema going up the right leg to mid calf Neurologic: CN 2-12 grossly intact. Sensation intact, DTR normal. Strength 5/5 in all 4.  Psychiatric: Normal judgment and insight. Alert and oriented x 3. Normal mood.    Labs on Admission: I have personally reviewed following  labs and imaging studies  CBC: Recent Labs  Lab 04/14/18 1106  WBC 8.7  HGB 8.8*  HCT 29.0*  MCV 87.6  PLT 915   Basic Metabolic Panel: Recent Labs  Lab 04/14/18 1106  NA 130*  K 2.8*  CL 92*  CO2 23  GLUCOSE 197*  BUN 19  CREATININE 1.16*  CALCIUM 8.2*   GFR: Estimated Creatinine Clearance: 47.2 mL/min (A) (by C-G formula based on SCr of 1.16 mg/dL (H)). Coagulation  Profile: Recent Labs  Lab 04/14/18 1106  INR 1.38   CBG: Recent Labs  Lab 04/14/18 1031  GLUCAP 203*   Urine analysis:    Component Value Date/Time   COLORURINE AMBER (A) 01/04/2018 2244   APPEARANCEUR CLOUDY (A) 01/04/2018 2244   LABSPEC 1.025 01/04/2018 2244   PHURINE 5.0 01/04/2018 2244   GLUCOSEU 50 (A) 01/04/2018 2244   HGBUR SMALL (A) 01/04/2018 2244   BILIRUBINUR NEGATIVE 01/04/2018 2244   BILIRUBINUR negative 07/22/2017 0858   BILIRUBINUR Small 12/06/2014 1441   KETONESUR 5 (A) 01/04/2018 2244   PROTEINUR 100 (A) 01/04/2018 2244   UROBILINOGEN 0.2 07/22/2017 0858   UROBILINOGEN 1.0 02/12/2015 1238   NITRITE NEGATIVE 01/04/2018 2244   LEUKOCYTESUR NEGATIVE 01/04/2018 2244    Radiological Exams on Admission: No results found.  EKG: Independently reviewed.  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  Osteomyelitis of the right foot.  Patient initially presented for treatment of this which still remains a problem.  Unfortunately she will not be able to undergo surgery.  Appears to be having some lymphangitic cellulitis as well.  Antibiotics will be started intravenously and continued until surgery.  Appreciate Dr. Jess Barters help.  2.  Paroxysmal atrial fibrillation with rapid ventricular response: Patient will be admitted to the stepdown unit on a diltiazem drip for management of her rapid atrial fibrillation.  We have also going to  give her some antiemetics and start back her home medications.  3.  Diabetes type 2 with peripheral vascular complications: Biding scale insulin coverage and resumption of home medications.  4.  Hypertension: Continue home medications.  5.  Depression with anxiety: Noted continue home medications.  6.  Gastroesophageal reflux disease: Continue home medications.  7.  Coronary artery disease with stable angina pectoris: Continue atorvastatin, RN nitroglycerin, and beta-blockers.  8.  Memory loss: Noted patient sister is very helpful in filling in the blanks.  9.  Diabetic peripheral neuropathy: Noted continue gabapentin.  10.  Obstructive sleep apnea: Patient sister will bring in her home CPAP seen.  11.  Status post transmetatarsal amputation of the right foot.  Suspect patient will require further amputation.    DVT prophylaxis: Brein drip and lieu of Eliquis given plan for surgery Code Status: Full code Family Communication: With patient and her sister who was present at the time of admission. Disposition Plan: Home versus skilled nursing facility depending on ambulatory status Consults called: Orthopedics Dr. Sharol Given Admission status: Inpatient   Lady Deutscher  MD FACP [facility] Pager [phone]  If 7PM-7AM, please contact night-coverage [email or web address] Password [email or web address]  04/14/2018, 12:45 PM

## 2018-04-14 NOTE — H&P (Signed)
Judith [name] is an 67 y.o. female.   Chief Complaint: Osteomyelitis right foot HPI: Patient is a 68 year old woman who is status post transmetatarsal amputation on the right.  Patient states that she has been having fever and chills her blood pressure has been 99/68 pulse 137 with a temperature of 100.5.  She states this started about 2 weeks ago when she noted a small ulcer over the medial right residual foot. The fever and chills began 2 days ago.   Past Medical History:  Diagnosis Date  . Abnormal EKG 07/31/2013  . Arthritis    "hands" (03/06/2015)  . Asthma   . Carpal tunnel syndrome, bilateral   . Chronic kidney disease   . Complication of anesthesia    slow to wake up  . Coronary artery disease     2 v CAD with CTO of the RCA and high grade bifurcational LCx/OM stenosis. S/P PCI DES x 2 to the LCx/OM.  Marland Kitchen Diabetic peripheral neuropathy ([place]) "since 1996"  . GERD (gastroesophageal reflux disease)   . Goiter   . Headache    migraines prior to menopause  . History of shingles 06/01/2013  . Hyperlipidemia LDL goal <70 10/13/2015  . Hypertension   . Hyperthyroidism   . PAF (paroxysmal atrial fibrillation) ([place]) 04/29/2015   CHADS2VASC score of 5 now on Apixaban  . Pneumonia ~ 1976  . Sleep apnea    Bipap  . Tremors of nervous system   . Type II diabetes mellitus (HCC)    insulin dependent    Past Surgical History:  Procedure Laterality Date  . ABDOMINAL HYSTERECTOMY  1988   age 7; CERVICAL DYSPLASIA; ovaries intact.   . AMPUTATION Right 01/23/2016   Procedure: Right 3rd Ray Amputation;  Surgeon: [name], MD;  Location: [facility];  Service: Orthopedics;  Laterality: Right;  . AMPUTATION Right 02/13/2016   Procedure: Right Transmetatarsal Amputation;  Surgeon: [name], MD;  Location: [facility];  Service: Orthopedics;  Laterality: Right;  . CARDIAC CATHETERIZATION N/A 02/27/2015   Procedure: Left Heart Cath and Coronary Angiography;  Surgeon: [name], MD; LAD 40%, mCFX  80%, OM 70%, RCA 100% calcified       . CARDIAC CATHETERIZATION N/A 03/06/2015   Procedure: Coronary Stent Intervention;  Surgeon: [name], MD;  Location: [facility];  Service: Cardiovascular;  Laterality: N/A;  Mid CX 3.50x12 promus DES w/ 0% resdual and Prox OM1 2.50x20 promus DES w/ 20% residual  . CARDIOVERSION    . CARPAL TUNNEL RELEASE Right Nov 2015  . CARPAL TUNNEL RELEASE Right 1992; 05/2014   [name]; [place]  . CESAREAN SECTION  1982; 1984  . FOOT NEUROMA SURGERY Bilateral 2000  . I&D EXTREMITY Left 01/06/2018   Procedure: DEBRIDEMENT ULCER LEFT FOOT;  Surgeon: [name], MD;  Location: [facility];  Service: Orthopedics;  Laterality: Left;  . KNEE ARTHROSCOPY Right ~ 2003   "meniscus repair"  . SHOULDER OPEN ROTATOR CUFF REPAIR Right 1996; 1998   "w/fracture repair"  . THYROID SURGERY  2000   "removed lots of nodules"  . TONSILLECTOMY  1976    Family History  Problem Relation Age of Onset  . Cancer Mother 68       bronchial cancer  . Breast cancer Mother   . Lung cancer Mother   . Hypertension Father   . COPD Father   . Heart disease Father 73       CAD with cardiac stenting  . Heart  attack Father   . Parkinson's disease Father   . Allergies Sister   . Breast cancer Maternal Grandmother   . Emphysema Maternal Grandfather   . Leukemia Paternal Grandmother   . Emphysema Paternal Grandfather   . Thyroid disease Neg Hx    Social History:  reports that she quit smoking about 3 years ago. Her smoking use included cigarettes. She smoked 0.00 packs per day for 41.00 years. She has never used smokeless tobacco. She reports that she does not drink alcohol or use drugs.  Allergies:  Allergies  Allergen Reactions  . Contrast Media [Iodinated Diagnostic Agents] Hives and Other (See Comments)    Spoke to patient, Iodine allergy is really IV contrast allergy.   Judith [name] [Insulin Aspart] Shortness Of Breath and Other (See Comments)    "breathing problems"   . Propofol Shortness Of Breath    "Breathing problems - asthma attack" Can take with benadryl   . Codeine Nausea And Vomiting and Other (See Comments)    HIGH DOSES-SEVERE VOMITING  . Iodine Other (See Comments)    MUST HAVE BENADRYL PRIOR TO PROCEDURE AND RIGHT BEFORE TREATMENT TO COUNTERACT REACTION-BLISTERING REACTION DERMATOLOGICAL  . Penicillins Itching, Rash and Other (See Comments)    Has patient had a PCN reaction causing immediate rash, facial/tongue/throat swelling, SOB or lightheadedness with hypotension: no Has patient had a PCN reaction causing severe rash involving mucus membranes or skin necrosis: No Has patient had a PCN reaction that required hospitalization No Has patient had a PCN reaction occurring within the last 10 years: No If all of the above answers are "NO", then may proceed with Cephalosporin use.  CHEST SIZED RASH AND ITCHING   . Ace Inhibitors Cough  . Demerol [Meperidine] Nausea And Vomiting  . Dilaudid [Hydromorphone Hcl] Other (See Comments)    HEADACHE   . Neosporin [Neomycin-Bacitracin Zn-Polymyx] Itching, Rash and Other (See Comments)    MAKES REACTIONS WORSE WHEN USING AS PROPHYLACTIC  . Percocet [Oxycodone-Acetaminophen] Rash  . Tape Itching and Rash    No medications prior to admission.    No results found for this or any previous visit (from the past 48 hour(s)). No results found.  Review of Systems  Constitutional: Positive for chills, fever and malaise/fatigue.    There were no vitals taken for this visit. Physical Exam   Assessment/Plan Osteomyelitis of right transmetatarsal residual limb- Plan for right transtibial amputation as she does not have further options for foot salvage. The procedure, possible risks and complications were discussed with the patient and she desires to proceed and consent was obtained.  This procedure has been fully reviewed with the patient and written informed consent has been obtained.   Judith Hong, PA-C 04/14/2018, 8:23 AM [employer] [phone]

## 2018-04-14 NOTE — Progress Notes (Signed)
Patient ID: Judith [name], female   DOB: 1950-07-26, 67 y.o.   MRN: [id number] Surgery canceled today to stabilize patient medically.  We will plan for transtibial amputation tomorrow Saturday morning.

## 2018-04-14 NOTE — Anesthesia Preprocedure Evaluation (Addendum)
Anesthesia Evaluation  Patient identified by MRN, date of birth, ID band Patient awake    Reviewed: Allergy & Precautions, NPO status , Patient's Chart, lab work & pertinent test results  Airway Mallampati: II  TM Distance: >3 FB Neck ROM: Full    Dental  (+) Dental Advisory Given, Edentulous Upper, Poor Dentition   Pulmonary asthma , sleep apnea and Continuous Positive Airway Pressure Ventilation , former smoker,    breath sounds clear to auscultation       Cardiovascular hypertension, Pt. on home beta blockers and Pt. on medications + CAD and + Cardiac Stents  + dysrhythmias Atrial Fibrillation  Rhythm:Irregular Rate:Normal   '19 TTE - Mild LVH. EF 55% to 60%. Possible mild anterolateral hypokinesis. Grade 2 diastolic dysfunction. Moderate AS. Mean gradient (S): 19 mm Hg. Peak gradient (S): 30 mm Hg. Valve area (VTI): 1.16 cm^2. Mild MR. LA mildly dilated. A trivial pericardial effusion was identified.    Neuro/Psych  Headaches, PSYCHIATRIC DISORDERS Anxiety Depression  Neuromuscular disease    GI/Hepatic Neg liver ROS, GERD  Medicated and Controlled,  Endo/Other  diabetes, Type 2, Insulin Dependent, Oral Hypoglycemic AgentsHyperthyroidism  Hyponatremia Hypokalemia Hypocalcemia Hypochloremia   Renal/GU CRFRenal disease     Musculoskeletal  (+) Arthritis ,   Abdominal   Peds  Hematology  (+) anemia ,   Anesthesia Other Findings   Reproductive/Obstetrics                            Anesthesia Physical  Anesthesia Plan  ASA: III  Anesthesia Plan: General   Post-op Pain Management:  Regional for Post-op pain   Induction: Intravenous  PONV Risk Score and Plan: 3 and Ondansetron, Dexamethasone, Diphenhydramine and Treatment may vary due to age or medical condition  Airway Management Planned: LMA  Additional Equipment: None  Intra-op Plan:   Post-operative Plan: Extubation in  OR  Informed Consent: I have reviewed the patients History and Physical, chart, labs and discussed the procedure including the risks, benefits and alternatives for the proposed anesthesia with the patient or authorized representative who has indicated his/her understanding and acceptance.   Dental advisory given  Plan Discussed with: Anesthesiologist and CRNA  Anesthesia Plan Comments:        Anesthesia Quick Evaluation

## 2018-04-14 NOTE — Progress Notes (Signed)
Verbal orders received from Dr. Tobias Alexander to give pt her home medication of Toprol XL 25mg  and Sotalol 120mg , that she missed x2 days.

## 2018-04-14 NOTE — Progress Notes (Addendum)
ANTICOAGULATION CONSULT NOTE - Initial Consult  Pharmacy Consult for heparin Indication: atrial fibrillation  Allergies  Allergen Reactions  . Contrast Media [Iodinated Diagnostic Agents] Hives and Other (See Comments)    Spoke to patient, Iodine allergy is really IV contrast allergy.   [name] [Insulin Aspart] Shortness Of Breath and Other (See Comments)    "breathing problems"  . Propofol Shortness Of Breath    "Breathing problems - asthma attack" Can take with benadryl   . Codeine Nausea And Vomiting and Other (See Comments)    HIGH DOSES-SEVERE VOMITING  . Iodine Other (See Comments)    MUST HAVE BENADRYL PRIOR TO PROCEDURE AND RIGHT BEFORE TREATMENT TO COUNTERACT REACTION-BLISTERING REACTION DERMATOLOGICAL  . Penicillins Itching, Rash and Other (See Comments)    Has patient had a PCN reaction causing immediate rash, facial/tongue/throat swelling, SOB or lightheadedness with hypotension: no Has patient had a PCN reaction causing severe rash involving mucus membranes or skin necrosis: No Has patient had a PCN reaction that required hospitalization No Has patient had a PCN reaction occurring within the last 10 years: No If all of the above answers are "NO", then may proceed with Cephalosporin use.  CHEST SIZED RASH AND ITCHING   . Ace Inhibitors Cough  . Demerol [Meperidine] Nausea And Vomiting  . Dilaudid [Hydromorphone Hcl] Other (See Comments)    HEADACHE   . Neosporin [Neomycin-Bacitracin Zn-Polymyx] Itching, Rash and Other (See Comments)    MAKES REACTIONS WORSE WHEN USING AS PROPHYLACTIC  . Percocet [Oxycodone-Acetaminophen] Rash  . Tape Itching and Rash    Patient Measurements: Height: 5' 4" (162.6 cm) Weight: 160 lb (72.6 kg) IBW/kg (Calculated) : 54.7 Heparin Dosing Weight: 70 kg  Vital Signs: Temp: 99 F (37.2 C) (12/06 1032) Temp Source: Oral (12/06 1032) BP: 139/113 (12/06 1205) Pulse Rate: 138 (12/06 1210)  Labs: Recent Labs    04/14/18 1106   HGB 8.8*  HCT 29.0*  PLT 367  LABPROT 16.8*  INR 1.38  CREATININE 1.16*    Estimated Creatinine Clearance: 47.2 mL/min (A) (by C-G formula based on SCr of 1.16 mg/dL (H)).   Medical History: Past Medical History:  Diagnosis Date  . Abnormal EKG 07/31/2013  . Arthritis    "hands" (03/06/2015)  . Asthma   . Carpal tunnel syndrome, bilateral   . Chronic kidney disease   . Complication of anesthesia    slow to wake up. Pt sts she woke up during surgery many years ago.  . Coronary artery disease     2 v CAD with CTO of the RCA and high grade bifurcational LCx/OM stenosis. S/P PCI DES x 2 to the LCx/OM.  Marland Kitchen Diabetic peripheral neuropathy ([place]) "since 1996"  . GERD (gastroesophageal reflux disease)   . Goiter   . Headache    migraines prior to menopause  . History of shingles 06/01/2013  . Hyperlipidemia LDL goal <70 10/13/2015  . Hypertension   . Hyperthyroidism   . Osteomyelitis of foot (HCC)    Right  . PAF (paroxysmal atrial fibrillation) ([place]) 04/29/2015   CHADS2VASC score of 5 now on Apixaban  . Pneumonia ~ 1976  . Sleep apnea    Bipap  . Tremors of nervous system   . Type II diabetes mellitus (HCC)    insulin dependent    Medications:  Medications Prior to Admission  Medication Sig Dispense Refill Last Dose  . Alcohol Swabs (B-D SINGLE USE SWABS REGULAR) PADS 1 each by Does not apply route 2 (two) times  daily. 100 each 2 04/14/2018 at Unknown time  . amLODipine (NORVASC) 5 MG tablet Take 1 tablet (5 mg total) by mouth daily. 30 tablet 0 04/12/2018 at Unknown time  . apixaban (ELIQUIS) 5 MG TABS tablet Take 1 tablet (5 mg total) by mouth 2 (two) times daily. 180 tablet 3 04/12/2018 at Unknown time  . atorvastatin (LIPITOR) 20 MG tablet Take 1 tablet (20 mg total) by mouth daily. (Patient taking differently: Take 20 mg by mouth every evening. ) 90 tablet 3 04/12/2018  . Blood Glucose Calibration (TRUE METRIX LEVEL 1) Low SOLN 1 each by In Vitro route as needed. 1 each 0  04/14/2018 at Unknown time  . Blood Glucose Monitoring Suppl (TRUE METRIX AIR GLUCOSE METER) w/Device KIT 1 each by Does not apply route daily. 1 kit 0 04/14/2018 at Unknown time  . buPROPion (WELLBUTRIN SR) 150 MG 12 hr tablet Take 150 mg by mouth 2 (two) times daily.   04/12/2018  . fenofibrate (TRICOR) 145 MG tablet Take 1 tablet (145 mg total) by mouth daily. (Patient taking differently: Take 145 mg by mouth every evening. ) 90 tablet 3 04/12/2018  . FLUoxetine (PROZAC) 20 MG capsule Take 1 capsule (20 mg total) by mouth at bedtime. 90 capsule 2 04/12/2018  . furosemide (LASIX) 20 MG tablet Take 3 tablets (60 mg total) by mouth daily. 273 tablet 3 04/12/2018  . gabapentin (NEURONTIN) 300 MG capsule Take 3 capsules (900 mg total) by mouth 2 (two) times daily. TAKE 3 CAPSULES BY MOUTH EVERY  MORNING AND 3 CAPSULES AT BEDTIME (Patient taking differently: Take 900 mg by mouth 2 (two) times daily. ) 540 capsule 0 04/12/2018  . glucose blood (TRUE METRIX BLOOD GLUCOSE TEST) test strip Used to check blood sugars 2x daily. 100 each 12 04/14/2018 at Unknown time  . insulin degludec (TRESIBA FLEXTOUCH) 100 UNIT/ML SOPN FlexTouch Pen Inject 0.15 mLs (15 Units total) into the skin daily. (Patient taking differently: Inject 20 Units into the skin every evening. ) 15 mL 0 04/12/2018  . Insulin Pen Needle 32G X 4 MM MISC Use to inject insulin daily 100 each 5 04/14/2018 at Unknown time  . Insulin Syringe-Needle U-100 (INSULIN SYRINGE .5CC/31GX5/16") 31G X 5/16" 0.5 ML MISC Use to inject insulin 100 each 5 04/14/2018 at Unknown time  . metFORMIN (GLUCOPHAGE-XR) 500 MG 24 hr tablet Take 4 tablets (2,000 mg total) by mouth daily with breakfast. (Patient taking differently: Take 1,000 mg by mouth 2 (two) times daily. ) 360 tablet 3 04/12/2018  . methimazole (TAPAZOLE) 10 MG tablet Take 1 tablet (10 mg total) by mouth daily. 30 tablet 11 04/12/2018  . metoprolol succinate (TOPROL-XL) 25 MG 24 hr tablet Take 1 tablet (25 mg total)  by mouth 2 (two) times daily. Take with or immediately following a meal. 180 tablet 1 04/12/2018  . pantoprazole (PROTONIX) 40 MG tablet Take 1 tablet (40 mg total) by mouth daily. 90 tablet 0 04/12/2018  . potassium chloride SA (K-DUR,KLOR-CON) 20 MEQ tablet Take 1 tablet (20 mEq total) by mouth daily.   04/12/2018  . sotalol (BETAPACE) 120 MG tablet Take 1 tablet (120 mg total) by mouth every 12 (twelve) hours. 180 tablet 3 04/12/2018  . traMADol (ULTRAM) 50 MG tablet Take 50 mg by mouth every 8 (eight) hours as needed for moderate pain or severe pain.   5 04/12/2018  . TRUEPLUS LANCETS 28G MISC 1 each by Does not apply route 2 (two) times daily. 100 each 2 04/14/2018  at Unknown time  . albuterol (PROVENTIL HFA) 108 (90 Base) MCG/ACT inhaler Inhale 1-2 puffs into the lungs every 6 (six) hours as needed for wheezing or shortness of breath. 1 Inhaler 0 Unknown at Unknown time  . albuterol (PROVENTIL) (2.5 MG/3ML) 0.083% nebulizer solution Take 3 mLs (2.5 mg total) by nebulization every 6 (six) hours as needed for wheezing or shortness of breath. 150 mL 0 Unknown at Unknown time  . nitroGLYCERIN (NITROSTAT) 0.4 MG SL tablet Place 1 tablet (0.4 mg total) under the tongue every 5 (five) minutes as needed for chest pain. 25 tablet 3 Unknown at Unknown time  . ondansetron (ZOFRAN-ODT) 8 MG disintegrating tablet Take 1 tablet (8 mg total) by mouth every 8 (eight) hours as needed for nausea or vomiting. 20 tablet 1 Unknown at Unknown time  . triamcinolone cream (KENALOG) 0.1 % Apply 1 application topically 2 (two) times daily. (Patient taking differently: Apply 1 application topically 2 (two) times daily as needed (for skin rash/irritation.). ) 30 g 0 Unknown at Unknown time    Assessment: 67 y/o female who presents for right transtibial amputation and was found to have irregular heart rate; now being admitted. Patient takes apixaban chronically for hx Afib, last dose was 12/4. Pharmacy consulted to bridge with IV  heparin. No bleeding noted, Hgb low at 8.8, platelets are normal. Will adjust heparin drip based on aPTT until effects of apixaban on heparin level have subsided.   Goal of Therapy:  Heparin level 0.3-0.7 units/ml aPTT 66-102 seconds Monitor platelets by anticoagulation protocol: Yes   Plan:  - Begin heparin drip at 1000 units/hr - Baseline heparin level and aPTT - 6 hr aPTT - Daily heparin level, aPTT and CBC - Monitor for s/sx of bleeding   Renold Genta, PharmD, BCPS Clinical Pharmacist Clinical phone for 04/14/2018 until 3p is [phone] 04/14/2018 1:23 PM  **Pharmacist phone directory can now be found on [email or web address] listed under [place]**

## 2018-04-14 NOTE — Progress Notes (Signed)
Report given to Raquel Sarna, RN [id number]. Pt in no apparent distress or pain. BP 107/75, HR 133, RR 21, O2 98% on RA. Family at the bedside. Will transport pt via portable monitor to the floor. The ability to ask questions was provided.

## 2018-04-14 NOTE — Anesthesia Preprocedure Evaluation (Deleted)
Anesthesia Evaluation  Patient identified by MRN, date of birth, ID band Patient awake    Reviewed: Allergy & Precautions, NPO status , Patient's Chart, lab work & pertinent test results, reviewed documented beta blocker date and time   Airway Mallampati: II  TM Distance: >3 FB Neck ROM: Full    Dental   Pulmonary asthma , sleep apnea and Continuous Positive Airway Pressure Ventilation , former smoker,           Cardiovascular hypertension, Pt. on home beta blockers and Pt. on medications + CAD and + Cardiac Stents  + dysrhythmias Atrial Fibrillation  Rhythm:Irregular Rate:Tachycardia     Neuro/Psych PSYCHIATRIC DISORDERS Anxiety Depression    GI/Hepatic Neg liver ROS, GERD  ,  Endo/Other  diabetesHyperthyroidism   Renal/GU Renal disease     Musculoskeletal   Abdominal   Peds  Hematology   Anesthesia Other Findings   Reproductive/Obstetrics                           Anesthesia Physical  Anesthesia Plan  ASA: III  Anesthesia Plan: General   Post-op Pain Management:  Regional for Post-op pain   Induction:   PONV Risk Score and Plan: 3 and Ondansetron, Dexamethasone and Diphenhydramine  Airway Management Planned: LMA  Additional Equipment:   Intra-op Plan:   Post-operative Plan: Extubation in OR  Informed Consent: I have reviewed the patients History and Physical, chart, labs and discussed the procedure including the risks, benefits and alternatives for the proposed anesthesia with the patient or authorized representative who has indicated his/her understanding and acceptance.   Dental advisory given  Plan Discussed with: Anesthesiologist and CRNA  Anesthesia Plan Comments: (Pt arrived in Afib with RVR will)       Anesthesia Quick Evaluation

## 2018-04-15 ENCOUNTER — Inpatient Hospital Stay (HOSPITAL_COMMUNITY): Payer: Medicare PPO | Admitting: Anesthesiology

## 2018-04-15 ENCOUNTER — Encounter (HOSPITAL_COMMUNITY)
Admission: RE | Disposition: A | Discharge status: Home / Self Care | Payer: Self-pay | Source: Home / Self Care | Attending: Internal Medicine

## 2018-04-15 ENCOUNTER — Encounter (HOSPITAL_COMMUNITY): Payer: Self-pay | Admitting: Certified Registered Nurse Anesthetist

## 2018-04-15 DIAGNOSIS — M86271 Subacute osteomyelitis, right ankle and foot: Secondary | ICD-10-CM

## 2018-04-15 DIAGNOSIS — M86171 Other acute osteomyelitis, right ankle and foot: Secondary | ICD-10-CM

## 2018-04-15 DIAGNOSIS — D62 Acute posthemorrhagic anemia: Secondary | ICD-10-CM

## 2018-04-15 DIAGNOSIS — K921 Melena: Secondary | ICD-10-CM

## 2018-04-15 HISTORY — PX: AMPUTATION: SHX166

## 2018-04-15 LAB — CBC
HCT: 23.7 % — ABNORMAL LOW (ref 36.0–46.0)
Hemoglobin: 7.5 g/dL — ABNORMAL LOW (ref 12.0–15.0)
MCH: 27.6 pg (ref 26.0–34.0)
MCHC: 31.6 g/dL (ref 30.0–36.0)
MCV: 87.1 fL (ref 80.0–100.0)
PLATELETS: 323 10*3/uL (ref 150–400)
RBC: 2.72 MIL/uL — ABNORMAL LOW (ref 3.87–5.11)
RDW: 14.8 % (ref 11.5–15.5)
WBC: 7.1 10*3/uL (ref 4.0–10.5)
nRBC: 0 % (ref 0.0–0.2)

## 2018-04-15 LAB — GLUCOSE, CAPILLARY
Glucose-Capillary: 163 mg/dL — ABNORMAL HIGH (ref 70–99)
Glucose-Capillary: 160 mg/dL — ABNORMAL HIGH (ref 70–99)
Glucose-Capillary: 142 mg/dL — ABNORMAL HIGH (ref 70–99)
Glucose-Capillary: 151 mg/dL — ABNORMAL HIGH (ref 70–99)
Glucose-Capillary: 121 mg/dL — ABNORMAL HIGH (ref 70–99)

## 2018-04-15 LAB — BASIC METABOLIC PANEL
Anion gap: 12 (ref 5–15)
BUN: 26 mg/dL — ABNORMAL HIGH (ref 8–23)
CO2: 24 mmol/L (ref 22–32)
Calcium: 7.7 mg/dL — ABNORMAL LOW (ref 8.9–10.3)
Chloride: 96 mmol/L — ABNORMAL LOW (ref 98–111)
Creatinine, Ser: 1.45 mg/dL — ABNORMAL HIGH (ref 0.44–1.00)
GFR calc Af Amer: 44 mL/min — ABNORMAL LOW (ref >60)
GFR calc non Af Amer: 38 mL/min — ABNORMAL LOW (ref >60)
Glucose, Bld: 181 mg/dL — ABNORMAL HIGH (ref 70–99)
Potassium: 3.7 mmol/L (ref 3.5–5.1)
Sodium: 132 mmol/L — ABNORMAL LOW (ref 135–145)

## 2018-04-15 LAB — HEMOGLOBIN AND HEMATOCRIT, BLOOD
HCT: 25.1 % — ABNORMAL LOW (ref 36.0–46.0)
Hemoglobin: 7.9 g/dL — ABNORMAL LOW (ref 12.0–15.0)

## 2018-04-15 LAB — APTT: aPTT: 59 seconds — ABNORMAL HIGH (ref 24–36)

## 2018-04-15 LAB — PREPARE RBC (CROSSMATCH)

## 2018-04-15 LAB — HEPARIN LEVEL (UNFRACTIONATED): Heparin Unfractionated: 1.68 IU/mL — ABNORMAL HIGH (ref 0.30–0.70)

## 2018-04-15 SURGERY — AMPUTATION BELOW KNEE
Anesthesia: General | Site: Leg Lower | Laterality: Right

## 2018-04-15 MED ORDER — SODIUM CHLORIDE 0.9% IV SOLUTION: Freq: Once | INTRAVENOUS | Status: DC

## 2018-04-15 MED ORDER — SODIUM CHLORIDE 0.9 % IV SOLN
8.0000 mg/h | INTRAVENOUS | Status: AC
  Administered 2018-04-15 – 2018-04-18 (×7): 8 mg/h via INTRAVENOUS
  Filled 2018-04-15 (×9): qty 80

## 2018-04-15 MED ORDER — PHENYLEPHRINE 40 MCG/ML (10ML) SYRINGE FOR IV PUSH (FOR BLOOD PRESSURE SUPPORT)
PREFILLED_SYRINGE | INTRAVENOUS | Status: AC
  Filled 2018-04-15: qty 10

## 2018-04-15 MED ORDER — MIDAZOLAM HCL 2 MG/2ML IJ SOLN
INTRAMUSCULAR | Status: DC | PRN
  Administered 2018-04-15: 1 mg via INTRAVENOUS

## 2018-04-15 MED ORDER — HEPARIN (PORCINE) 25000 UT/250ML-% IV SOLN
1400.0000 [IU]/h | INTRAVENOUS | Status: AC
  Administered 2018-04-15: 1250 [IU]/h via INTRAVENOUS
  Filled 2018-04-15: qty 250

## 2018-04-15 MED ORDER — METHOCARBAMOL 500 MG PO TABS
500.0000 mg | ORAL_TABLET | Freq: Four times a day (QID) | ORAL | Status: DC | PRN
  Administered 2018-04-19 – 2018-04-20 (×3): 500 mg via ORAL
  Filled 2018-04-15 (×3): qty 1

## 2018-04-15 MED ORDER — METOPROLOL SUCCINATE ER 25 MG PO TB24
25.0000 mg | ORAL_TABLET | Freq: Two times a day (BID) | ORAL | Status: DC
  Administered 2018-04-15 – 2018-04-20 (×9): 25 mg via ORAL
  Filled 2018-04-15 (×10): qty 1

## 2018-04-15 MED ORDER — PROPOFOL 10 MG/ML IV BOLUS
INTRAVENOUS | Status: DC | PRN
  Administered 2018-04-15: 150 mg via INTRAVENOUS

## 2018-04-15 MED ORDER — SODIUM CHLORIDE 0.9 % IV SOLN: INTRAVENOUS | Status: DC

## 2018-04-15 MED ORDER — SODIUM CHLORIDE 0.9 % IV SOLN
80.0000 mg | Freq: Once | INTRAVENOUS | Status: AC
  Administered 2018-04-15: 80 mg via INTRAVENOUS
  Filled 2018-04-15: qty 80

## 2018-04-15 MED ORDER — METOCLOPRAMIDE HCL 10 MG PO TABS: 5.0000 mg | ORAL_TABLET | Freq: Three times a day (TID) | ORAL | Status: DC | PRN

## 2018-04-15 MED ORDER — LACTATED RINGERS IV SOLN
INTRAVENOUS | Status: DC | PRN
  Administered 2018-04-15: 08:00:00 via INTRAVENOUS

## 2018-04-15 MED ORDER — PHENYLEPHRINE HCL 10 MG/ML IJ SOLN
INTRAMUSCULAR | Status: DC | PRN
  Administered 2018-04-15 (×4): 120 ug via INTRAVENOUS

## 2018-04-15 MED ORDER — DOCUSATE SODIUM 100 MG PO CAPS
100.0000 mg | ORAL_CAPSULE | Freq: Two times a day (BID) | ORAL | Status: DC
  Administered 2018-04-16 – 2018-04-20 (×6): 100 mg via ORAL
  Filled 2018-04-15 (×10): qty 1

## 2018-04-15 MED ORDER — METOCLOPRAMIDE HCL 5 MG/ML IJ SOLN: 5.0000 mg | Freq: Three times a day (TID) | INTRAMUSCULAR | Status: DC | PRN

## 2018-04-15 MED ORDER — FENTANYL CITRATE (PF) 250 MCG/5ML IJ SOLN
INTRAMUSCULAR | Status: AC
  Filled 2018-04-15: qty 5

## 2018-04-15 MED ORDER — SODIUM CHLORIDE 0.9 % IV SOLN
INTRAVENOUS | Status: DC
  Administered 2018-04-16: 500 mL via INTRAVENOUS

## 2018-04-15 MED ORDER — GABAPENTIN 300 MG PO CAPS: 300.0000 mg | ORAL_CAPSULE | Freq: Three times a day (TID) | ORAL | Status: DC

## 2018-04-15 MED ORDER — ONDANSETRON HCL 4 MG/2ML IJ SOLN
INTRAMUSCULAR | Status: AC
  Filled 2018-04-15: qty 2

## 2018-04-15 MED ORDER — LACTATED RINGERS IV SOLN
INTRAVENOUS | Status: DC
  Administered 2018-04-15: 08:00:00 via INTRAVENOUS

## 2018-04-15 MED ORDER — LIDOCAINE HCL (CARDIAC) PF 100 MG/5ML IV SOSY
PREFILLED_SYRINGE | INTRAVENOUS | Status: DC | PRN
  Administered 2018-04-15: 60 mg via INTRAVENOUS

## 2018-04-15 MED ORDER — PHENYLEPHRINE 40 MCG/ML (10ML) SYRINGE FOR IV PUSH (FOR BLOOD PRESSURE SUPPORT)
PREFILLED_SYRINGE | INTRAVENOUS | Status: AC
  Filled 2018-04-15: qty 20

## 2018-04-15 MED ORDER — DIPHENHYDRAMINE HCL 50 MG/ML IJ SOLN
INTRAMUSCULAR | Status: DC | PRN
  Administered 2018-04-15: 12.5 mg via INTRAVENOUS

## 2018-04-15 MED ORDER — METOPROLOL SUCCINATE ER 50 MG PO TB24: 50.0000 mg | ORAL_TABLET | Freq: Two times a day (BID) | ORAL | Status: DC

## 2018-04-15 MED ORDER — MORPHINE SULFATE (PF) 2 MG/ML IV SOLN: 0.5000 mg | INTRAVENOUS | Status: DC | PRN

## 2018-04-15 MED ORDER — FENTANYL CITRATE (PF) 100 MCG/2ML IJ SOLN
INTRAMUSCULAR | Status: DC | PRN
  Administered 2018-04-15: 50 ug via INTRAVENOUS

## 2018-04-15 MED ORDER — POLYETHYLENE GLYCOL 3350 17 G PO PACK
17.0000 g | PACK | Freq: Every day | ORAL | Status: DC | PRN
  Administered 2018-04-18: 17 g via ORAL
  Filled 2018-04-15: qty 1

## 2018-04-15 MED ORDER — PANTOPRAZOLE SODIUM 40 MG PO TBEC
40.0000 mg | DELAYED_RELEASE_TABLET | Freq: Two times a day (BID) | ORAL | Status: DC
  Administered 2018-04-15 – 2018-04-20 (×10): 40 mg via ORAL
  Filled 2018-04-15 (×10): qty 1

## 2018-04-15 MED ORDER — HYDROCODONE-ACETAMINOPHEN 7.5-325 MG PO TABS
1.0000 | ORAL_TABLET | ORAL | Status: DC | PRN
  Administered 2018-04-18: 2 via ORAL
  Filled 2018-04-15: qty 2

## 2018-04-15 MED ORDER — ONDANSETRON HCL 4 MG PO TABS: 4.0000 mg | ORAL_TABLET | Freq: Four times a day (QID) | ORAL | Status: DC | PRN

## 2018-04-15 MED ORDER — METHOCARBAMOL 1000 MG/10ML IJ SOLN
500.0000 mg | Freq: Four times a day (QID) | INTRAVENOUS | Status: DC | PRN
  Filled 2018-04-15: qty 5

## 2018-04-15 MED ORDER — MAGNESIUM CITRATE PO SOLN
1.0000 | Freq: Once | ORAL | Status: DC | PRN
  Filled 2018-04-15: qty 296

## 2018-04-15 MED ORDER — DIPHENHYDRAMINE HCL 50 MG/ML IJ SOLN
INTRAMUSCULAR | Status: AC
  Filled 2018-04-15: qty 1

## 2018-04-15 MED ORDER — BISACODYL 10 MG RE SUPP
10.0000 mg | Freq: Every day | RECTAL | Status: DC | PRN
  Administered 2018-04-18: 10 mg via RECTAL
  Filled 2018-04-15: qty 1

## 2018-04-15 MED ORDER — ACETAMINOPHEN 325 MG PO TABS: 325.0000 mg | ORAL_TABLET | Freq: Four times a day (QID) | ORAL | Status: DC | PRN

## 2018-04-15 MED ORDER — POVIDONE-IODINE 10 % EX SWAB: 2.0000 "application " | Freq: Once | CUTANEOUS | Status: DC

## 2018-04-15 MED ORDER — PROPOFOL 10 MG/ML IV BOLUS
INTRAVENOUS | Status: AC
  Filled 2018-04-15: qty 20

## 2018-04-15 MED ORDER — HYDROCODONE-ACETAMINOPHEN 5-325 MG PO TABS
1.0000 | ORAL_TABLET | ORAL | Status: DC | PRN
  Administered 2018-04-15 (×2): 1 via ORAL
  Administered 2018-04-16 – 2018-04-18 (×3): 2 via ORAL
  Administered 2018-04-18: 1 via ORAL
  Administered 2018-04-19 – 2018-04-20 (×4): 2 via ORAL
  Filled 2018-04-15: qty 1
  Filled 2018-04-15: qty 2
  Filled 2018-04-15: qty 1
  Filled 2018-04-15 (×5): qty 2
  Filled 2018-04-15: qty 1
  Filled 2018-04-15: qty 2
  Filled 2018-04-15: qty 1

## 2018-04-15 MED ORDER — 0.9 % SODIUM CHLORIDE (POUR BTL) OPTIME
TOPICAL | Status: DC | PRN
  Administered 2018-04-15: 1000 mL

## 2018-04-15 MED ORDER — ONDANSETRON HCL 4 MG/2ML IJ SOLN: 4.0000 mg | Freq: Four times a day (QID) | INTRAMUSCULAR | Status: DC | PRN

## 2018-04-15 MED ORDER — ONDANSETRON HCL 4 MG/2ML IJ SOLN
INTRAMUSCULAR | Status: DC | PRN
  Administered 2018-04-15: 4 mg via INTRAVENOUS

## 2018-04-15 MED ORDER — MIDAZOLAM HCL 2 MG/2ML IJ SOLN
INTRAMUSCULAR | Status: AC
  Filled 2018-04-15: qty 2

## 2018-04-15 MED ORDER — BUPIVACAINE-EPINEPHRINE (PF) 0.5% -1:200000 IJ SOLN
INTRAMUSCULAR | Status: DC | PRN
  Administered 2018-04-15: 30 mL via PERINEURAL

## 2018-04-15 MED ORDER — HEPARIN (PORCINE) 25000 UT/250ML-% IV SOLN: 1250.0000 [IU]/h | INTRAVENOUS | Status: DC

## 2018-04-15 MED ORDER — PANTOPRAZOLE SODIUM 40 MG IV SOLR
40.0000 mg | Freq: Two times a day (BID) | INTRAVENOUS | Status: DC
  Administered 2018-04-19: 40 mg via INTRAVENOUS
  Filled 2018-04-15 (×2): qty 40

## 2018-04-15 SURGICAL SUPPLY — 34 items
BENZOIN TINCTURE PRP APPL 2/3 (GAUZE/BANDAGES/DRESSINGS) ×14 IMPLANT
BLADE SAW RECIP 87.9 MT (BLADE) ×2 IMPLANT
BLADE SURG 21 STRL SS (BLADE) ×3 IMPLANT
BNDG COHESIVE 6X5 TAN STRL LF (GAUZE/BANDAGES/DRESSINGS) ×4 IMPLANT
CANISTER WOUND CARE 500ML ATS (WOUND CARE) ×2 IMPLANT
COVER SURGICAL LIGHT HANDLE (MISCELLANEOUS) ×3 IMPLANT
COVER WAND RF STERILE (DRAPES) ×3 IMPLANT
CUFF TOURNIQUET SINGLE 34IN LL (TOURNIQUET CUFF) ×2 IMPLANT
DRAPE INCISE IOBAN 66X45 STRL (DRAPES) ×2 IMPLANT
DRAPE U-SHAPE 47X51 STRL (DRAPES) ×3 IMPLANT
DRESSING PREVENA PLUS CUSTOM (GAUZE/BANDAGES/DRESSINGS) IMPLANT
DRSG PREVENA PLUS CUSTOM (GAUZE/BANDAGES/DRESSINGS) ×3
DURAPREP 26ML APPLICATOR (WOUND CARE) ×3 IMPLANT
ELECT REM PT RETURN 9FT ADLT (ELECTROSURGICAL) ×3
ELECTRODE REM PT RTRN 9FT ADLT (ELECTROSURGICAL) ×1 IMPLANT
GLOVE BIOGEL PI IND STRL 9 (GLOVE) ×1 IMPLANT
GLOVE BIOGEL PI INDICATOR 9 (GLOVE) ×2
GLOVE SURG ORTHO 9.0 STRL STRW (GLOVE) ×3 IMPLANT
GOWN STRL REUS W/ TWL XL LVL3 (GOWN DISPOSABLE) ×2 IMPLANT
GOWN STRL REUS W/TWL XL LVL3 (GOWN DISPOSABLE) ×4
KIT BASIN OR (CUSTOM PROCEDURE TRAY) ×3 IMPLANT
KIT TURNOVER KIT B (KITS) ×3 IMPLANT
NS IRRIG 1000ML POUR BTL (IV SOLUTION) ×3 IMPLANT
PACK ORTHO EXTREMITY (CUSTOM PROCEDURE TRAY) ×3 IMPLANT
PAD ARMBOARD 7.5X6 YLW CONV (MISCELLANEOUS) ×3 IMPLANT
PREVENA RESTOR ARTHOFORM 33X30 (CANNISTER) ×2 IMPLANT
STAPLER VISISTAT 35W (STAPLE) ×2 IMPLANT
STOCKINETTE IMPERVIOUS LG (DRAPES) ×3 IMPLANT
SUT SILK 2 0 (SUTURE) ×2
SUT SILK 2-0 18XBRD TIE 12 (SUTURE) ×1 IMPLANT
TOWEL OR 17X26 10 PK STRL BLUE (TOWEL DISPOSABLE) ×3 IMPLANT
TUBE CONNECTING 12'X1/4 (SUCTIONS) ×1
TUBE CONNECTING 12X1/4 (SUCTIONS) ×2 IMPLANT
YANKAUER SUCT BULB TIP NO VENT (SUCTIONS) ×3 IMPLANT

## 2018-04-15 NOTE — Op Note (Signed)
   Date of Surgery: 04/15/2018  INDICATIONS: Ms. Dudek is a 67 y.o.-year-old female who has osteomyelitis abscess and ulceration right foot salvage intervention with a transmetatarsal amputation.  Patient presents at this time for a transtibial amputation.Marland Kitchen  PREOPERATIVE DIAGNOSIS: Osteomyelitis abscess ulceration right midfoot amputation.  POSTOPERATIVE DIAGNOSIS: Same.  PROCEDURE: Transtibial amputation Application of Prevena wound VAC  SURGEON: Sharol Given, M.D.  ANESTHESIA:  general  IV FLUIDS AND URINE: See anesthesia.  ESTIMATED BLOOD LOSS: Minimal mL.  COMPLICATIONS: None.  DESCRIPTION OF PROCEDURE: The patient was brought to the operating room and underwent a general anesthetic. After adequate levels of anesthesia were obtained patient's lower extremity was prepped using DuraPrep draped into a sterile field. A timeout was called. The foot was draped out of the sterile field with impervious stockinette. A transverse incision was made 11 cm distal to the tibial tubercle. This curved proximally and a large posterior flap was created. The tibia was transected 1 cm proximal to the skin incision. The fibula was transected just proximal to the tibial incision. The tibia was beveled anteriorly. A large posterior flap was created. The sciatic nerve was pulled cut and allowed to retract. The vascular bundles were suture ligated with 2-0 silk. The deep and superficial fascial layers were closed using #1 Vicryl. The skin was closed using staples and 2-0 nylon. The wound was covered with a Prevena restore and customizable wound VAC. There was a good suction fit. A prosthetic shrinker will be applied. Patient was extubated taken to the PACU in stable condition.   DISCHARGE PLANNING:  Antibiotic duration: Antibiotics 24 hours postoperatively  Weightbearing: Nonweightbearing on the right  Pain medication: Low-dose opioid pathway ordered  Dressing care/ Wound VAC: Continue wound VAC for 1  week  Discharge to: Skilled nursing facility  Follow-up: In the office 1 week post operative.  [name], MD [place] 10:05 AM

## 2018-04-15 NOTE — Plan of Care (Signed)

## 2018-04-15 NOTE — Progress Notes (Signed)
PROGRESS NOTE                                                                                                                                                                                                             Patient Demographics:    Judith [name], is a 67 y.o. female, DOB - 29-Jan-1951, ZOX:[id number]  Admit date - 04/14/2018   Admitting Physician [name], MD  Outpatient Primary MD for the patient is Rutherford Guys, MD  LOS - 1   No chief complaint on file.      Brief Narrative   67 y.o. female with medical history significant of osteomyelitis of the right foot, chronic kidney disease, coronary artery disease, diabetes mellitus with peripheral neuropathy, paroxysmal atrial fibrillation, sleep apnea who presented  for surgery on her right foot due to osteomyelitis.  Found to have A. fib with RVR, her surgery was canceled, and [facility] hospitalist were consulted, she was admitted under our service, requires Cardizem drip, was on heparin drip, she was noted to have melena, hemoglobin has dropped, patient endorses using NSAIDs for last 10 days.   Subjective:    Judith [name] today has, No headache, No chest pain, No abdominal pain , did report melena yesterday   Assessment  & Plan :    Principal Problem:   Foot osteomyelitis, right ([place]) Active Problems:   DM (diabetes mellitus), type 2 with peripheral vascular complications (HCC)   HTN (hypertension)   Depression with anxiety   GERD (gastroesophageal reflux disease)   Coronary artery disease with stable angina pectoris (HCC)   Memory loss   Diabetic peripheral neuropathy ([place])   Obstructive sleep apnea   S/P transmetatarsal amputation of foot, right (HCC)   Atrial fibrillation with RVR (HCC)   Osteomyelitis of the right foot.  - Status post transtibial amputation by Dr. Sharol Given 04/15/2018, will continue with IV antibiotics 24 hours after surgery, then will discontinue .  Paroxysmal  atrial fibrillation with rapid ventricular response: - Patient will be admitted to the stepdown unit on a diltiazem drip for management of her rapid atrial fibrillation.  -Is on Eliquis, has been on hold for surgery, she was on heparin GTT overnight, currently on hold secondary to GI bleed -She is on 10 mg/hour of Cardizem drip, to receive her home dose sotalol and Toprol-XL, hopefully it  can be weaned down, pressure is soft, if needed we will get some digoxin.  Acute blood loss anemia  -Likely in the setting of upper GI bleed as she is having melena, will check Hemoccult, hold heparin drip, she endorses heavy NSAIDs use over last 10 days, will start on Protonix drip, GI consulted, hemoglobin 7.5 this morning, will give 1 unit PRBC, monitor H&H closely and transfuse as needed  Diabetes type 2 with peripheral vascular complications: -Continue with Lantus, and feet monitor CBG  Hypertension:  -Amlodipine given soft blood pressure  Depression with anxiety:  -Noted continue home medications.  Coronary artery disease with stable angina pectoris:  -Continue atorvastatin, RN nitroglycerin, and beta-blockers.  Diabetic peripheral neuropathy:  -Noted continue gabapentin.  Obstructive sleep apnea:  -Continue with CPAP     Code Status : Full code  Family Communication  : Sister at bedside  Disposition Plan  : pending PT consult  Consults  :  ortho  Procedures  : Right transtibial amputation by Dr. Sharol Given 04/15/2018  DVT Prophylaxis  : Heparin GTT on hold  Lab Results  Component Value Date   PLT 323 04/15/2018    Antibiotics  :    Anti-infectives (From admission, onward)   Start     Dose/Rate Route Frequency Ordered Stop   04/15/18 1800  vancomycin (VANCOCIN) IVPB 750 mg/150 ml premix     750 mg 150 mL/hr over 60 Minutes Intravenous Every 24 hours 04/14/18 1702     04/15/18 0600  ceFAZolin (ANCEF) IVPB 2g/100 mL premix     2 g 200 mL/hr over 30 Minutes Intravenous On  call to O.R. 04/14/18 2141 04/15/18 0919   04/14/18 1800  vancomycin (VANCOCIN) 1,500 mg in sodium chloride 0.9 % 500 mL IVPB     1,500 mg 250 mL/hr over 120 Minutes Intravenous  Once 04/14/18 1702 04/14/18 1900   04/14/18 1700  vancomycin (VANCOCIN) IVPB 1000 mg/200 mL premix  Status:  Discontinued     1,000 mg 200 mL/hr over 60 Minutes Intravenous  Once 04/14/18 1654 04/14/18 1655   04/14/18 1045  clindamycin (CLEOCIN) IVPB 900 mg     900 mg 100 mL/hr over 30 Minutes Intravenous On call to O.R. 04/14/18 1036 04/15/18 0559        Objective:   Vitals:   04/15/18 1056 04/15/18 1130 04/15/18 1330 04/15/18 1358  BP: (!) 111/59 122/81 115/66 118/86  Pulse: 82 82 83 83  Resp: 17 18 17  (!) 21  Temp: 98.1 F (36.7 C) 98 F (36.7 C) 97.7 F (36.5 C) 97.9 F (36.6 C)  TempSrc: Oral Oral Axillary Oral  SpO2: 98% 100% 94% 96%  Weight:      Height:        Wt Readings from Last 3 Encounters:  04/14/18 72.6 kg  04/13/18 78.2 kg  04/07/18 78.2 kg     Intake/Output Summary (Last 24 hours) at 04/15/2018 1419 Last data filed at 04/15/2018 1154 Gross per 24 hour  Intake 1715.43 ml  Output 20 ml  Net 1695.43 ml     Physical Exam  Awake Alert, Oriented X 3, No new F.N deficits, Normal affect Symmetrical Chest wall movement, Good air movement bilaterally, CTAB RRR,No Gallops,Rubs or new Murmurs, No Parasternal Heave +ve B.Sounds, Abd Soft, No tenderness, No organomegaly appriciated, No rebound - guarding or rigidity. No Cyanosis, right lower extremity status post trans-tibial amputation, on wound VAC    Data Review:    CBC Recent Labs  Lab 04/14/18 1106 04/15/18  0252  WBC 8.7 7.1  HGB 8.8* 7.5*  HCT 29.0* 23.7*  PLT 367 323  MCV 87.6 87.1  MCH 26.6 27.6  MCHC 30.3 31.6  RDW 14.6 14.8    Chemistries  Recent Labs  Lab 04/14/18 1106 04/15/18 0252  NA 130* 132*  K 2.8* 3.7  CL 92* 96*  CO2 23 24  GLUCOSE 197* 181*  BUN 19 26*  CREATININE 1.16* 1.45*  CALCIUM  8.2* 7.7*   ------------------------------------------------------------------------------------------------------------------ No results for input(s): CHOL, HDL, LDLCALC, TRIG, CHOLHDL, LDLDIRECT in the last 72 hours.  Lab Results  Component Value Date   HGBA1C 8.0 (H) 04/14/2018   ------------------------------------------------------------------------------------------------------------------ No results for input(s): TSH, T4TOTAL, T3FREE, THYROIDAB in the last 72 hours.  Invalid input(s): FREET3 ------------------------------------------------------------------------------------------------------------------ No results for input(s): VITAMINB12, FOLATE, FERRITIN, TIBC, IRON, RETICCTPCT in the last 72 hours.  Coagulation profile Recent Labs  Lab 04/14/18 1106  INR 1.38    No results for input(s): DDIMER in the last 72 hours.  Cardiac Enzymes No results for input(s): CKMB, TROPONINI, MYOGLOBIN in the last 168 hours.  Invalid input(s): CK ------------------------------------------------------------------------------------------------------------------    Component Value Date/Time   BNP 62.6 11/15/2015 0629    Inpatient Medications  Scheduled Meds: . sodium chloride   Intravenous Once  . amLODipine  5 mg Oral Daily  . atorvastatin  20 mg Oral QPM  . buPROPion  150 mg Oral BID  . docusate sodium  100 mg Oral BID  . fenofibrate  160 mg Oral Daily  . FLUoxetine  20 mg Oral QHS  . furosemide  60 mg Oral Daily  . gabapentin  900 mg Oral BID  . insulin glargine  20 Units Subcutaneous QHS  . methimazole  10 mg Oral Daily  . metoprolol succinate  25 mg Oral BID  . pantoprazole  40 mg Oral BID  . [START ON 04/19/2018] pantoprazole  40 mg Intravenous Q12H  . potassium chloride SA  20 mEq Oral Daily  . sotalol  120 mg Oral Q12H   Continuous Infusions: . sodium chloride    . diltiazem (CARDIZEM) infusion 10 mg/hr (04/15/18 0919)  . lactated ringers Stopped (04/14/18 1900)    . lactated ringers 10 mL/hr at 04/15/18 0817  . methocarbamol (ROBAXIN) IV    . pantoprazole (PROTONIX) IVPB 80 mg (04/15/18 1417)  . pantoprozole (PROTONIX) infusion    . vancomycin     PRN Meds:.[START ON 04/16/2018] acetaminophen, albuterol, bisacodyl, fentaNYL (SUBLIMAZE) injection, HYDROcodone-acetaminophen, HYDROcodone-acetaminophen, magnesium citrate, methocarbamol **OR** methocarbamol (ROBAXIN) IV, metoCLOPramide **OR** metoCLOPramide (REGLAN) injection, morphine injection, nitroGLYCERIN, ondansetron **OR** ondansetron (ZOFRAN) IV, ondansetron **OR** ondansetron (ZOFRAN) IV, polyethylene glycol  Micro Results Recent Results (from the past 240 hour(s))  WOUND CULTURE     Status: None (Preliminary result)   Collection Time: 04/13/18 10:39 AM  Result Value Ref Range Status   MICRO NUMBER: [id number]  Preliminary   SPECIMEN QUALITY: Adequate  Preliminary   SOURCE: WOUND (SITE NOT SPECIFIED)  Preliminary   STATUS: PRELIMINARY  Preliminary   GRAM STAIN:   Preliminary    Few Polymorphonuclear leukocytes Many Gram positive cocci in clusters  MRSA PCR Screening     Status: None   Collection Time: 04/14/18  2:49 PM  Result Value Ref Range Status   MRSA by PCR NEGATIVE NEGATIVE Final    Comment:        The GeneXpert MRSA Assay (FDA approved for NASAL specimens only), is one component of a comprehensive MRSA colonization surveillance program. It is not intended  to diagnose MRSA infection nor to guide or monitor treatment for MRSA infections. Performed at [facility], [place] [address] [place], [place] [address]     Radiology Reports Mr Shoulder Right Wo Contrast  Result Date: 03/24/2018 CLINICAL DATA:  Chronic right shoulder pain. Rotator cuff repair 20 years ago. EXAM: MRI OF THE RIGHT SHOULDER WITHOUT CONTRAST TECHNIQUE: Multiplanar, multisequence MR imaging of the shoulder was performed. No intravenous contrast was administered. COMPARISON:  None. FINDINGS: Despite efforts  by the technologist and patient, severe motion artifact is present on today's exam and could not be eliminated. Numerous series were repeated, but were also affected by motion artifact. This reduces exam sensitivity and specificity. Rotator cuff: Full-thickness full width tear of the supraspinatus tendon, the tendon is retracted up to 2.5 cm Full-thickness, full width tear of the infraspinatus tendon retracted 2.0 cm. Suspected small ossifications in the infraspinatus tendon, image 11/8. Mild subscapularis and teres minor tendinopathy. Muscles:  Mild infraspinatus and teres minor atrophy Biceps long head:  Mild tendinopathy of the intra-articular segment. Acromioclavicular Joint: Moderate spurring. Fluid signal in the joint. Small degenerative subcortical cystic lesions. There is a linear low T1 signal and high T2 signal band in the acromion potentially from a small os acromiale or a healing acromial fracture. Type I acromion. As expected there is a small amount of fluid in the subacromial subdeltoid bursa. Glenohumeral Joint: Mild spurring of the humeral head. No overt joint effusion. Labrum:  Indeterminate due to motion artifact. Bones:  Unremarkable Other: No supplemental non-categorized findings. IMPRESSION: 1. Full-thickness full width tear of the supraspinatus tendon retracted up to 2.5 cm. 2. Full-thickness full width tear of the infraspinatus tendon retracted 2.0 cm. 3. Mild subscapularis and teres minor tendinopathy. 4. Mild infraspinatus and teres minor atrophy. The infraspinatus atrophy could well be due to the rotator cuff rupture. Teres minor atrophy is of less certain cause, but could be due to low-level chronic quadrilateral space syndrome. 5. Moderate degenerative AC joint arthropathy. 6. There is a small band of low T1 and accentuated T2 signal traversing the acromion and best seen for example on image 12/8. This would be an unusual appearance for os acromiale although could conceivably be an  anterior os acromiale. This could also be an acromial healing fracture or stress fracture. 7. Despite efforts by the technologist and patient, motion artifact is present on today's exam and could not be eliminated. This reduces exam sensitivity and specificity. Electronically Signed   By: Van Clines M.D.   On: 03/24/2018 10:54     Phillips Climes M.D on 04/15/2018 at 2:19 PM  Between 7am to 7pm - Pager - [phone]  After 7pm go to [email or web address] - password [facility]  [facility] -  Office  [phone]

## 2018-04-15 NOTE — Progress Notes (Signed)
ANTICOAGULATION CONSULT NOTE   Pharmacy Consult for Heparin Indication: atrial fibrillation  Patient Measurements: Height: 5' 3.5" (161.3 cm) Weight: 160 lb (72.6 kg) IBW/kg (Calculated) : 53.55 Heparin Dosing Weight: 70 kg  Vital Signs: Temp: 98.1 F (36.7 C) (12/07 1056) Temp Source: Oral (12/07 1056) BP: 111/59 (12/07 1056) Pulse Rate: 82 (12/07 1056)  Labs: Recent Labs    04/14/18 1106 04/14/18 1351 04/14/18 1352 04/14/18 2253 04/15/18 0252  HGB 8.8*  --   --   --  7.5*  HCT 29.0*  --   --   --  23.7*  PLT 367  --   --   --  323  APTT  --   --  42* 55* 59*  LABPROT 16.8*  --   --   --   --   INR 1.38  --   --   --   --   HEPARINUNFRC  --  1.80*  --   --  1.68*  CREATININE 1.16*  --   --   --  1.45*    Estimated Creatinine Clearance: 37.4 mL/min (A) (by C-G formula based on SCr of 1.45 mg/dL (H)).   Medical History: Past Medical History:  Diagnosis Date  . Abnormal EKG 07/31/2013  . Arthritis    "hands" (03/06/2015)  . Asthma   . Carpal tunnel syndrome, bilateral   . Chronic kidney disease   . Complication of anesthesia    slow to wake up. Pt sts she woke up during surgery many years ago.  . Coronary artery disease     2 v CAD with CTO of the RCA and high grade bifurcational LCx/OM stenosis. S/P PCI DES x 2 to the LCx/OM.  Marland Kitchen Diabetic peripheral neuropathy ([place]) "since 1996"  . GERD (gastroesophageal reflux disease)   . Goiter   . Headache    migraines prior to menopause  . History of shingles 06/01/2013  . Hyperlipidemia LDL goal <70 10/13/2015  . Hypertension   . Hyperthyroidism   . Osteomyelitis of foot (HCC)    Right  . PAF (paroxysmal atrial fibrillation) ([place]) 04/29/2015   CHADS2VASC score of 5 now on Apixaban  . Pneumonia ~ 1976  . Sleep apnea    Bipap  . Tremors of nervous system   . Type II diabetes mellitus (HCC)    insulin dependent    Medications:  Medications Prior to Admission  Medication Sig Dispense Refill Last Dose  . Alcohol  Swabs (B-D SINGLE USE SWABS REGULAR) PADS 1 each by Does not apply route 2 (two) times daily. 100 each 2 04/14/2018 at Unknown time  . amLODipine (NORVASC) 5 MG tablet Take 1 tablet (5 mg total) by mouth daily. 30 tablet 0 04/12/2018 at Unknown time  . apixaban (ELIQUIS) 5 MG TABS tablet Take 1 tablet (5 mg total) by mouth 2 (two) times daily. 180 tablet 3 04/12/2018 at Unknown time  . atorvastatin (LIPITOR) 20 MG tablet Take 1 tablet (20 mg total) by mouth daily. (Patient taking differently: Take 20 mg by mouth every evening. ) 90 tablet 3 04/12/2018  . Blood Glucose Calibration (TRUE METRIX LEVEL 1) Low SOLN 1 each by In Vitro route as needed. 1 each 0 04/14/2018 at Unknown time  . Blood Glucose Monitoring Suppl (TRUE METRIX AIR GLUCOSE METER) w/Device KIT 1 each by Does not apply route daily. 1 kit 0 04/14/2018 at Unknown time  . buPROPion (WELLBUTRIN SR) 150 MG 12 hr tablet Take 150 mg by mouth 2 (two) times  daily.   04/12/2018  . fenofibrate (TRICOR) 145 MG tablet Take 1 tablet (145 mg total) by mouth daily. (Patient taking differently: Take 145 mg by mouth every evening. ) 90 tablet 3 04/12/2018  . FLUoxetine (PROZAC) 20 MG capsule Take 1 capsule (20 mg total) by mouth at bedtime. 90 capsule 2 04/12/2018  . furosemide (LASIX) 20 MG tablet Take 3 tablets (60 mg total) by mouth daily. 273 tablet 3 04/12/2018  . gabapentin (NEURONTIN) 300 MG capsule Take 3 capsules (900 mg total) by mouth 2 (two) times daily. TAKE 3 CAPSULES BY MOUTH EVERY  MORNING AND 3 CAPSULES AT BEDTIME (Patient taking differently: Take 900 mg by mouth 2 (two) times daily. ) 540 capsule 0 04/12/2018  . glucose blood (TRUE METRIX BLOOD GLUCOSE TEST) test strip Used to check blood sugars 2x daily. 100 each 12 04/14/2018 at Unknown time  . insulin degludec (TRESIBA FLEXTOUCH) 100 UNIT/ML SOPN FlexTouch Pen Inject 0.15 mLs (15 Units total) into the skin daily. (Patient taking differently: Inject 20 Units into the skin every evening. ) 15 mL 0  04/12/2018  . Insulin Pen Needle 32G X 4 MM MISC Use to inject insulin daily 100 each 5 04/14/2018 at Unknown time  . Insulin Syringe-Needle U-100 (INSULIN SYRINGE .5CC/31GX5/16") 31G X 5/16" 0.5 ML MISC Use to inject insulin 100 each 5 04/14/2018 at Unknown time  . metFORMIN (GLUCOPHAGE-XR) 500 MG 24 hr tablet Take 4 tablets (2,000 mg total) by mouth daily with breakfast. (Patient taking differently: Take 1,000 mg by mouth 2 (two) times daily. ) 360 tablet 3 04/12/2018  . methimazole (TAPAZOLE) 10 MG tablet Take 1 tablet (10 mg total) by mouth daily. 30 tablet 11 04/12/2018  . metoprolol succinate (TOPROL-XL) 25 MG 24 hr tablet Take 1 tablet (25 mg total) by mouth 2 (two) times daily. Take with or immediately following a meal. 180 tablet 1 04/12/2018  . pantoprazole (PROTONIX) 40 MG tablet Take 1 tablet (40 mg total) by mouth daily. 90 tablet 0 04/12/2018  . potassium chloride SA (K-DUR,KLOR-CON) 20 MEQ tablet Take 1 tablet (20 mEq total) by mouth daily.   04/12/2018  . sotalol (BETAPACE) 120 MG tablet Take 1 tablet (120 mg total) by mouth every 12 (twelve) hours. 180 tablet 3 04/12/2018  . traMADol (ULTRAM) 50 MG tablet Take 50 mg by mouth every 8 (eight) hours as needed for moderate pain or severe pain.   5 04/12/2018  . TRUEPLUS LANCETS 28G MISC 1 each by Does not apply route 2 (two) times daily. 100 each 2 04/14/2018 at Unknown time  . albuterol (PROVENTIL HFA) 108 (90 Base) MCG/ACT inhaler Inhale 1-2 puffs into the lungs every 6 (six) hours as needed for wheezing or shortness of breath. 1 Inhaler 0 Unknown at Unknown time  . albuterol (PROVENTIL) (2.5 MG/3ML) 0.083% nebulizer solution Take 3 mLs (2.5 mg total) by nebulization every 6 (six) hours as needed for wheezing or shortness of breath. 150 mL 0 Unknown at Unknown time  . nitroGLYCERIN (NITROSTAT) 0.4 MG SL tablet Place 1 tablet (0.4 mg total) under the tongue every 5 (five) minutes as needed for chest pain. 25 tablet 3 Unknown at Unknown time  .  ondansetron (ZOFRAN-ODT) 8 MG disintegrating tablet Take 1 tablet (8 mg total) by mouth every 8 (eight) hours as needed for nausea or vomiting. 20 tablet 1 Unknown at Unknown time  . triamcinolone cream (KENALOG) 0.1 % Apply 1 application topically 2 (two) times daily. (Patient taking differently: Apply 1 application  topically 2 (two) times daily as needed (for skin rash/irritation.). ) 30 g 0 Unknown at Unknown time    Assessment: 67 y/o female who presents for right transtibial amputation and was found to have irregular heart rate; now being admitted. Patient takes apixaban chronically for hx Afib, last dose was 12/4. Pharmacy consulted to bridge with IV heparin. No bleeding noted, Hgb low at 8.8, platelets are normal. Will adjust heparin drip based on aPTT until effects of apixaban on heparin level have subsided.   12/7 Heparin gtt turned off pre-operatively.  Spoke to Dr. Sharol Given - okay with resuming heparin now post op.  Goal of Therapy:  Heparin level 0.3-0.7 units/ml aPTT 66-102 seconds Monitor platelets by anticoagulation protocol: Yes   Plan:  -Restart heparin at 1250 units/hr -Check aPTT in 8 hrs. -Daily heparin level and APTT -Could oral anticoagulation be resumed soon?  Marguerite Olea, [facility] Clinical Pharmacist Phone [phone]  04/15/2018 11:35 AM

## 2018-04-15 NOTE — Progress Notes (Signed)
Heparin was infusing while changing shift, talked to Short stay nurse regarding time of stopping heparin. Paging Dr. Sharol Given regarding this and returned call at [place]. Dr. Sharol Given verbal ordered to stop heparin now. Stopped heprain 0745. Reported to Short stay nurse. HS [employer]

## 2018-04-15 NOTE — Progress Notes (Signed)
Orthopedic Tech Progress Note Patient Details:  LORELAI HUYSER 1951-01-16 [id number] Bio-tech person was here and brace was ordered. Patient ID: BROOK GERACI, female   DOB: [birth year], 67 y.o.   MRN: [id number]   Ladell Pier [facility] 04/15/2018, 12:36 PM

## 2018-04-15 NOTE — H&P (View-Only) (Signed)
Referring Provider:  Dr. Waldron Labs, [facility] Primary Care Physician:  Rutherford Guys, MD Primary Gastroenterologist:  Dr. Silverio Decamp  Reason for Consultation:  Anemia and black stools  HPI: Judith [name] is a 67 y.o. female with medical history significant of osteomyelitis of the right foot, chronic kidney disease, coronary artery disease, diabetes mellitus with peripheral neuropathy, paroxysmal atrial fibrillation on Eliquis, sleep apnea who presented to [facility] for surgery on her right foot due to osteomyelitis.  She has been complaining of fevers and chills with lowish blood pressures at home.  She saw Dr. Sharol Given who felt that she needed a likely revision of her transmetatarsal amputation due to osteomyelitis, which she had done today.  GI was consulted due to drop in Hgb and report of black stool.  Hgb was 11.9 grams just 2 months ago and was down to 7.5 grams this AM.  She is getting 2 units PRBC's.  She says that she had a black stool yesterday.  Had been taking a few NSAID's per day at home recently for the past week or so for some ear pain.  Sees occasional red blood upon wiping but contributes that to hemorrhoids.  Was on Eliquis but that is on hold.  Was on heparin gtt, but that is currently on hold as well.  Colonoscopy 10/2016 showed the following:  - One 5 mm polyp in the ascending colon, removed with a cold snare. Resected and retrieved. - Severe diverticulosis in the sigmoid colon, in the descending colon, in the transverse colon, in the ascending colon and in the cecum. There was narrowing of the colon in association with the diverticular opening. There was evidence of diverticular spasm. There was evidence of an impacted diverticulum. - Non-bleeding internal hemorrhoids.  EGD showed 10/2016 showed the following:  - Monilial esophagitis. Biopsied. - Small hiatal hernia. - Normal examined duodenum.   Past Medical History:  Diagnosis Date  . Abnormal EKG 07/31/2013  .  Arthritis    "hands" (03/06/2015)  . Asthma   . Carpal tunnel syndrome, bilateral   . Chronic kidney disease   . Complication of anesthesia    slow to wake up. Pt sts she woke up during surgery many years ago.  . Coronary artery disease     2 v CAD with CTO of the RCA and high grade bifurcational LCx/OM stenosis. S/P PCI DES x 2 to the LCx/OM.  Marland Kitchen Diabetic peripheral neuropathy ([place]) "since 1996"  . GERD (gastroesophageal reflux disease)   . Goiter   . Headache    migraines prior to menopause  . History of shingles 06/01/2013  . Hyperlipidemia LDL goal <70 10/13/2015  . Hypertension   . Hyperthyroidism   . Osteomyelitis of foot (HCC)    Right  . PAF (paroxysmal atrial fibrillation) ([place]) 04/29/2015   CHADS2VASC score of 5 now on Apixaban  . Pneumonia ~ 1976  . Sleep apnea    Bipap  . Tremors of nervous system   . Type II diabetes mellitus (HCC)    insulin dependent    Past Surgical History:  Procedure Laterality Date  . ABDOMINAL HYSTERECTOMY  1988   age 28; CERVICAL DYSPLASIA; ovaries intact.   . AMPUTATION Right 01/23/2016   Procedure: Right 3rd Ray Amputation;  Surgeon: [name], MD;  Location: [place];  Service: Orthopedics;  Laterality: Right;  . AMPUTATION Right 02/13/2016   Procedure: Right Transmetatarsal Amputation;  Surgeon: [name], MD;  Location: [place];  Service: Orthopedics;  Laterality: Right;  . CARDIAC CATHETERIZATION N/A 02/27/2015   Procedure: Left Heart Cath and Coronary Angiography;  Surgeon: [name], MD; LAD 40%, mCFX 80%, OM 70%, RCA 100% calcified       . CARDIAC CATHETERIZATION N/A 03/06/2015   Procedure: Coronary Stent Intervention;  Surgeon: [name], MD;  Location: [place] [facility];  Service: Cardiovascular;  Laterality: N/A;  Mid CX 3.50x12 promus DES w/ 0% resdual and Prox OM1 2.50x20 promus DES w/ 20% residual  . CARDIOVERSION    . CARPAL TUNNEL RELEASE Right Nov 2015  . CARPAL TUNNEL RELEASE Right 1992; 05/2014   [name];  [place]  . CESAREAN SECTION  1982; 1984  . FOOT NEUROMA SURGERY Bilateral 2000  . I&D EXTREMITY Left 01/06/2018   Procedure: DEBRIDEMENT ULCER LEFT FOOT;  Surgeon: [name], MD;  Location: [place];  Service: Orthopedics;  Laterality: Left;  . KNEE ARTHROSCOPY Right ~ 2003   "meniscus repair"  . SHOULDER OPEN ROTATOR CUFF REPAIR Right 1996; 1998   "w/fracture repair"  . THYROID SURGERY  2000   "removed lots of nodules"  . TONSILLECTOMY  1976    Prior to Admission medications   Medication Sig Start Date End Date Taking? Authorizing Provider  Alcohol Swabs (B-D SINGLE USE SWABS REGULAR) PADS 1 each by Does not apply route 2 (two) times daily. 08/29/17  Yes Renato Shin, MD  amLODipine (NORVASC) 5 MG tablet Take 1 tablet (5 mg total) by mouth daily. 01/11/18  Yes Regalado, Belkys A, MD  apixaban (ELIQUIS) 5 MG TABS tablet Take 1 tablet (5 mg total) by mouth 2 (two) times daily. 08/17/17  Yes Turner, Eber Hong, MD  atorvastatin (LIPITOR) 20 MG tablet Take 1 tablet (20 mg total) by mouth daily. Patient taking differently: Take 20 mg by mouth every evening.  08/22/17  Yes Turner, Eber Hong, MD  Blood Glucose Calibration (TRUE METRIX LEVEL 1) Low SOLN 1 each by In Vitro route as needed. 08/25/17  Yes Renato Shin, MD  Blood Glucose Monitoring Suppl (TRUE METRIX AIR GLUCOSE METER) w/Device KIT 1 each by Does not apply route daily. 08/25/17  Yes Renato Shin, MD  buPROPion [facility] SR) 150 MG 12 hr tablet Take 150 mg by mouth 2 (two) times daily.   Yes [provider]  fenofibrate (TRICOR) 145 MG tablet Take 1 tablet (145 mg total) by mouth daily. Patient taking differently: Take 145 mg by mouth every evening.  08/22/17 08/17/18 Yes Turner, Eber Hong, MD  FLUoxetine (PROZAC) 20 MG capsule Take 1 capsule (20 mg total) by mouth at bedtime. 08/31/17  Yes [name], PA-C  furosemide (LASIX) 20 MG tablet Take 3 tablets (60 mg total) by mouth daily. 02/01/18 02/01/19 Yes Turner, Eber Hong, MD    gabapentin (NEURONTIN) 300 MG capsule Take 3 capsules (900 mg total) by mouth 2 (two) times daily. TAKE 3 CAPSULES BY MOUTH EVERY  MORNING AND 3 CAPSULES AT BEDTIME Patient taking differently: Take 900 mg by mouth 2 (two) times daily.  08/31/17  Yes [name], PA-C  glucose blood (TRUE METRIX BLOOD GLUCOSE TEST) test strip Used to check blood sugars 2x daily. 08/25/17  Yes Renato Shin, MD  insulin degludec (TRESIBA FLEXTOUCH) 100 UNIT/ML SOPN FlexTouch Pen Inject 0.15 mLs (15 Units total) into the skin daily. Patient taking differently: Inject 20 Units into the skin every evening.  04/03/18  Yes Renato Shin, MD  Insulin Pen Needle 32G X 4 MM MISC Use to inject insulin daily  08/13/16  Yes Renato Shin, MD  Insulin Syringe-Needle U-100 (INSULIN SYRINGE .5CC/31GX5/16") 31G X 5/16" 0.5 ML MISC Use to inject insulin 08/13/16  Yes Renato Shin, MD  metFORMIN (GLUCOPHAGE-XR) 500 MG 24 hr tablet Take 4 tablets (2,000 mg total) by mouth daily with breakfast. Patient taking differently: Take 1,000 mg by mouth 2 (two) times daily.  08/25/17  Yes Renato Shin, MD  methimazole (TAPAZOLE) 10 MG tablet Take 1 tablet (10 mg total) by mouth daily. 08/25/17  Yes Renato Shin, MD  metoprolol succinate (TOPROL-XL) 25 MG 24 hr tablet Take 1 tablet (25 mg total) by mouth 2 (two) times daily. Take with or immediately following a meal. 03/28/18  Yes Turner, Traci R, MD  pantoprazole (PROTONIX) 40 MG tablet Take 1 tablet (40 mg total) by mouth daily. 08/31/17  Yes [name], PA-C  potassium chloride SA (K-DUR,KLOR-CON) 20 MEQ tablet Take 1 tablet (20 mEq total) by mouth daily. 01/11/18  Yes Bonnielee Haff, MD  sotalol (BETAPACE) 120 MG tablet Take 1 tablet (120 mg total) by mouth every 12 (twelve) hours. 08/22/17  Yes Turner, Eber Hong, MD  traMADol (ULTRAM) 50 MG tablet Take 50 mg by mouth every 8 (eight) hours as needed for moderate pain or severe pain.  02/02/18  Yes [provider]  TRUEPLUS LANCETS 28G  MISC 1 each by Does not apply route 2 (two) times daily. 08/29/17  Yes Renato Shin, MD  albuterol (PROVENTIL HFA) 108 (90 Base) MCG/ACT inhaler Inhale 1-2 puffs into the lungs every 6 (six) hours as needed for wheezing or shortness of breath. 11/07/15   [name], MD  albuterol (PROVENTIL) (2.5 MG/3ML) 0.083% nebulizer solution Take 3 mLs (2.5 mg total) by nebulization every 6 (six) hours as needed for wheezing or shortness of breath. 08/31/17   [name], PA-C  nitroGLYCERIN (NITROSTAT) 0.4 MG SL tablet Place 1 tablet (0.4 mg total) under the tongue every 5 (five) minutes as needed for chest pain. 02/10/17   [name], PA-C  ondansetron (ZOFRAN-ODT) 8 MG disintegrating tablet Take 1 tablet (8 mg total) by mouth every 8 (eight) hours as needed for nausea or vomiting. 02/06/18   Rutherford Guys, MD  triamcinolone cream (KENALOG) 0.1 % Apply 1 application topically 2 (two) times daily. Patient taking differently: Apply 1 application topically 2 (two) times daily as needed (for skin rash/irritation.).  10/07/17   [name], PA-C    Current Facility-Administered Medications  Medication Dose Route Frequency Provider Last Rate Last Dose  . 0.9 %  sodium chloride infusion (Manually program via Guardrails IV Fluids)   Intravenous Once [name], MD      . 0.9 %  sodium chloride infusion   Intravenous Continuous [name], MD      . Derrill Memo ON 04/16/2018] acetaminophen (TYLENOL) tablet 325-650 mg  325-650 mg Oral Q6H PRN [name], MD      . albuterol (PROVENTIL) (2.5 MG/3ML) 0.083% nebulizer solution 2.5 mg  2.5 mg Nebulization Q6H PRN [name], MD      . amLODipine (NORVASC) tablet 5 mg  5 mg Oral Daily [name], MD   5 mg at 04/14/18 1723  . atorvastatin (LIPITOR) tablet 20 mg  20 mg Oral QPM [name], MD   20 mg at 04/14/18 1722  . bisacodyl (DULCOLAX) suppository 10 mg  10 mg Rectal Daily PRN [name], MD      . buPROPion [facility] SR) 12  hr  tablet 150 mg  150 mg Oral BID [name], MD   150 mg at 04/14/18 2148  . diltiazem (CARDIZEM) 100 mg in dextrose 5% 152m (1 mg/mL) infusion  5-15 mg/hr Intravenous Titrated DNewt Minion MD 10 mL/hr at 04/15/18 0919 10 mg/hr at 04/15/18 0919  . docusate sodium (COLACE) capsule 100 mg  100 mg Oral BID DNewt Minion MD      . fenofibrate tablet 160 mg  160 mg Oral Daily DNewt Minion MD      . fentaNYL (SUBLIMAZE) injection 50 mcg  50 mcg Intravenous Q4H PRN DNewt Minion MD   50 mcg at 04/14/18 2150  . FLUoxetine (PROZAC) capsule 20 mg  20 mg Oral QHS DNewt Minion MD   20 mg at 04/14/18 2149  . furosemide (LASIX) tablet 60 mg  60 mg Oral Daily DNewt Minion MD   60 mg at 04/14/18 2203  . gabapentin (NEURONTIN) capsule 900 mg  900 mg Oral BID DNewt Minion MD   900 mg at 04/14/18 2149  . heparin ADULT infusion 100 units/mL (25000 units/2528msodium chloride 0.45%)  1,250 Units/hr Intravenous Continuous Carney, JeGay FillerRPH      . HYDROcodone-acetaminophen (NORCO) 7.5-325 MG per tablet 1-2 tablet  1-2 tablet Oral Q4H PRN DuNewt MinionMD      . HYDROcodone-acetaminophen (NORCO/VICODIN) 5-325 MG per tablet 1-2 tablet  1-2 tablet Oral Q4H PRN DuNewt MinionMD      . insulin glargine (LANTUS) injection 20 Units  20 Units Subcutaneous QHS DuNewt MinionMD   20 Units at 04/14/18 2204  . lactated ringers infusion   Intravenous Continuous DuNewt MinionMD   Stopped at 04/14/18 1900  . lactated ringers infusion   Intravenous Continuous DuNewt MinionMD 10 mL/hr at 04/15/18 [id number]  . magnesium citrate solution 1 Bottle  1 Bottle Oral Once PRN DuNewt MinionMD      . methimazole (TAPAZOLE) tablet 10 mg  10 mg Oral Daily DuNewt MinionMD   10 mg at 04/14/18 1723  . methocarbamol (ROBAXIN) tablet 500 mg  500 mg Oral Q6H PRN DuNewt MinionMD       Or  . methocarbamol (ROBAXIN) 500 mg in dextrose 5 % 50 mL IVPB  500 mg Intravenous Q6H PRN DuNewt MinionMD      .  metoCLOPramide (REGLAN) tablet 5-10 mg  5-10 mg Oral Q8H PRN DuNewt MinionMD       Or  . metoCLOPramide (REGLAN) injection 5-10 mg  5-10 mg Intravenous Q8H PRN DuNewt MinionMD      . metoprolol succinate (TOPROL-XL) 24 hr tablet 25 mg  25 mg Oral BID DuNewt MinionMD   25 mg at 04/14/18 2149  . morphine 2 MG/ML injection 0.5-1 mg  0.5-1 mg Intravenous Q2H PRN DuNewt MinionMD      . nitroGLYCERIN (NITROSTAT) SL tablet 0.4 mg  0.4 mg Sublingual Q5 min PRN DuNewt MinionMD      . ondansetron ([facility] Hospitaltable[facility] 4 mg  4 mg Oral Q6H PRN DuNewt MinionMD       Or  . ondansetron (ZAdventhealth North Pinellasinjection 4 mg  4 mg Intravenous Q6H PRN DuNewt MinionMD   4 mg at 04/14/18 1534  . ondansetron (ZOFRAN) tablet 4 mg  4 mg Oral Q6H PRN DuNewt MinionMD  Or  . ondansetron (ZOFRAN) injection 4 mg  4 mg Intravenous Q6H PRN [name], MD      . pantoprazole (PROTONIX) 80 mg in sodium chloride 0.9 % 100 mL IVPB  80 mg Intravenous Once Elgergawy, Silver Huguenin, MD      . pantoprazole (PROTONIX) 80 mg in sodium chloride 0.9 % 250 mL (0.32 mg/mL) infusion  8 mg/hr Intravenous Continuous Elgergawy, Silver Huguenin, MD      . pantoprazole (PROTONIX) EC tablet 40 mg  40 mg Oral BID Elgergawy, Silver Huguenin, MD      . Derrill Memo ON 04/19/2018] pantoprazole (PROTONIX) injection 40 mg  40 mg Intravenous Q12H Elgergawy, Dawood S, MD      . polyethylene glycol (MIRALAX / GLYCOLAX) packet 17 g  17 g Oral Daily PRN [name], MD      . potassium chloride SA (K-DUR,KLOR-CON) CR tablet 20 mEq  20 mEq Oral Daily [name], MD   20 mEq at 04/14/18 1722  . sotalol (BETAPACE) tablet 120 mg  120 mg Oral Q12H [name], MD   120 mg at 04/14/18 2148  . vancomycin (VANCOCIN) IVPB 750 mg/150 ml premix  750 mg Intravenous Q24H [name], MD        Allergies as of 04/13/2018 - Review Complete 04/13/2018  Allergen Reaction Noted  . Contrast media [iodinated diagnostic agents] Hives and Other (See Comments) 08/07/2017  .  Novolog [insulin aspart] Shortness Of Breath and Other (See Comments) 02/27/2015  . Propofol Shortness Of Breath 11/26/2015  . Codeine Nausea And Vomiting and Other (See Comments) 09/28/2013  . Iodine Other (See Comments) 01/30/2013  . Penicillins Itching, Rash, and Other (See Comments) 01/30/2013  . Ace inhibitors Cough 03/13/2015  . Demerol [meperidine] Nausea And Vomiting 08/15/2015  . Dilaudid [hydromorphone hcl] Other (See Comments) 05/19/2016  . Neosporin [neomycin-bacitracin zn-polymyx] Itching, Rash, and Other (See Comments) 01/30/2013  . Percocet [oxycodone-acetaminophen] Rash 08/15/2015  . Tape Itching and Rash 01/30/2013    Family History  Problem Relation Age of Onset  . Cancer Mother 57       bronchial cancer  . Breast cancer Mother   . Lung cancer Mother   . Hypertension Father   . COPD Father   . Heart disease Father 42       CAD with cardiac stenting  . Heart attack Father   . Parkinson's disease Father   . Allergies Sister   . Breast cancer Maternal Grandmother   . Emphysema Maternal Grandfather   . Leukemia Paternal Grandmother   . Emphysema Paternal Grandfather   . Thyroid disease Neg Hx     Social History   Socioeconomic History  . Marital status: Single    Spouse name: Not on file  . Number of children: 2  . Years of education: Masters  . Highest education level: Not on file  Occupational History  . Occupation: Landscape architect  Social Needs  . Financial resource strain: Somewhat hard  . Food insecurity:    Worry: Sometimes true    Inability: Sometimes true  . Transportation needs:    Medical: No    Non-medical: No  Tobacco Use  . Smoking status: Former Smoker    Packs/day: 0.00    Years: 41.00    Pack years: 0.00    Types: Cigarettes    Last attempt to quit: 03/05/2015    Years since quitting: 3.1  . Smokeless tobacco: Never Used  . Tobacco comment: 04/29/2015 "  quit smoking cigarettes 02/27/2015"  Substance and Sexual Activity  .  Alcohol use: No  . Drug use: No  . Sexual activity: Not Currently    Birth control/protection: Post-menopausal, Surgical  Lifestyle  . Physical activity:    Days per week: 0 days    Minutes per session: 0 min  . Stress: Very much  Relationships  . Social connections:    Talks on phone: More than three times a week    Gets together: More than three times a week    Attends religious service: More than 4 times per year    Active member of club or organization: No    Attends meetings of clubs or organizations: Never    Relationship status: Never married  . Intimate partner violence:    Fear of current or ex partner: No    Emotionally abused: No    Physically abused: No    Forced sexual activity: No  Other Topics Concern  . 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      Lives with sister in [place].      Tobacco: 1 ppd x 41 years - quit 2016      Alcohol: none      Drugs: none      Exercise:  Walking in neighborhood; physical job.   Right-handed.   2 cups caffeine daily.    Review of Systems: ROS is O/W negative except as mentioned in HPI.  Physical Exam: Vital signs in last 24 hours: Temp:  [97.5 F (36.4 C)-100 F (37.8 C)] 97.9 F (36.6 C) (12/07 1358) Pulse Rate:  [79-157] 83 (12/07 1358) Resp:  [13-25] 21 (12/07 1358) BP: (95-124)/(57-86) 118/86 (12/07 1358) SpO2:  [71 %-100 %] 96 % (12/07 1358) Last BM Date: 04/14/18 General:  Alert, Well-developed, well-nourished, pleasant and cooperative in NAD Head:  Normocephalic and atraumatic. Eyes:  Sclera clear, no icterus.  Conjunctiva pink. Ears:  Normal auditory acuity. Mouth:  No deformity or lesions.   Lungs:  Clear throughout to auscultation.  No wheezes, crackles, or rhonchi.  Heart:   Regular rate and rhythm; no murmurs, clicks, rubs, or gallops. Abdomen:  Soft, non-distended.  BS present.  Non-tender.   Pulses:  Normal pulses noted. Extremities:  S/p right mid-foot amputation. Neurologic:  Alert and oriented x 4;  grossly normal neurologically. Skin:  Intact without significant lesions or rashes. Psych:  Alert and cooperative. Normal mood and affect.  Intake/Output from previous day: 12/06 0701 - 12/07 0700 In: 1315.4 [P.O.:120; I.V.:1195.4] Out: -  Intake/Output this shift: Total I/O In: 400 [I.V.:400] Out: 20 [Blood:20]  Lab Results: Recent Labs    04/14/18 1106 04/15/18 0252  WBC 8.7 7.1  HGB 8.8* 7.5*  HCT 29.0* 23.7*  PLT 367 323   BMET Recent Labs    04/14/18 1106 04/15/18 0252  NA 130* 132*  K 2.8* 3.7  CL 92* 96*  CO2 23 24  GLUCOSE 197* 181*  BUN 19 26*  CREATININE 1.16* 1.45*  CALCIUM 8.2* 7.7*   PT/INR Recent Labs    04/14/18 1106  LABPROT 16.8*  INR 1.38   IMPRESSION:  *Anemia and reported melena:  Reports using NSAID's in the form of naproxen several a day recently.  Hgb 7.5  grams this AM compared to 11.9 grams just 2 months ago.  Receiving 2 units PRBC's. *Atrial fibrillation on Eliquis:  This has been on hold.  But is now on heparin. *Osteomyelitis abscess with right midfoot amputation today.  PLAN: *Will plan for EGD on 12/8. *Continue to hold Eliquis.  Will hold heparin for 6 hours prior to EGD. *Continue PPI gtt for now. *Monitor Hgb and transfuse further prn needed. Laban Emperor. Zehr  04/15/2018, 2:03 PM  GI ATTENDING  History, laboratories, x-rays reviewed.  Patient personally seen and examined.  67 year old with multiple medical problems.  Agree with comprehensive consultation note as outlined above.  Patient appears to have had a hemodynamically stable upper GI bleed in the face of chronic anticoagulation therapy.  Agree with transfusion and holding anticoagulation therapy for now.  Continue PPI drip.  Plan upper  endoscopy in the morning.The nature of the procedure, as well as the risks, benefits, and alternatives were carefully and thoroughly reviewed with the patient. Ample time for discussion and questions allowed. The patient understood, was satisfied, and agreed to proceed.  The patient is high risk given her age, comorbidities, and the need for chronic anticoagulation therapy.Docia Chuck Geri Seminole., M.D. [facility] Division of Gastroenterology

## 2018-04-15 NOTE — Anesthesia Procedure Notes (Signed)
Anesthesia Regional Block: Popliteal block   Pre-Anesthetic Checklist: ,, timeout performed, Correct Patient, Correct Site, Correct Laterality, Correct Procedure, Correct Position, site marked, Risks and benefits discussed,  Surgical consent,  Pre-op evaluation,  At surgeon's request and post-op pain management  Laterality: Right  Prep: chloraprep       Needles:  Injection technique: Single-shot  Needle Type: Echogenic Needle     Needle Length: 10cm  Needle Gauge: 21     Additional Needles:   Narrative:  Start time: 04/15/2018 8:40 AM End time: 04/15/2018 8:44 AM Injection made incrementally with aspirations every 5 mL.  Performed by: Personally  Anesthesiologist: [name], MD  Additional Notes: No pain on injection. No increased resistance to injection. Injection made in 5cc increments. Good needle visualization. Patient tolerated the procedure well.

## 2018-04-15 NOTE — Interval H&P Note (Signed)
History and Physical Interval Note:  04/15/2018 10:25 AM  Judith [name]  has presented today for surgery, with the diagnosis of right foot osteomyelitis  The various methods of treatment have been discussed with the patient and family. After consideration of risks, benefits and other options for treatment, the patient has consented to  Procedure(s): AMPUTATION BELOW KNEE (Right) as a surgical intervention .  The patient's history has been reviewed, patient examined, no change in status, stable for surgery.  I have reviewed the patient's chart and labs.  Questions were answered to the patient's satisfaction.     [name]

## 2018-04-15 NOTE — Progress Notes (Signed)
RT placed patient on CPAP HS home unit. 6L O2 bleed in needed. Patient tolerating well.

## 2018-04-15 NOTE — Anesthesia Procedure Notes (Signed)
Procedure Name: LMA Insertion Date/Time: 04/15/2018 9:18 AM Performed by: Inda Coke, CRNA Pre-anesthesia Checklist: Patient identified, Emergency Drugs available, Suction available and Patient being monitored Patient Re-evaluated:Patient Re-evaluated prior to induction Oxygen Delivery Method: Circle System Utilized Preoxygenation: Pre-oxygenation with 100% oxygen Induction Type: IV induction Ventilation: Mask ventilation without difficulty LMA: LMA inserted LMA Size: 4.0 Number of attempts: 2 Airway Equipment and Method: Bite block Placement Confirmation: positive ETCO2 Tube secured with: Tape Dental Injury: Teeth and Oropharynx as per pre-operative assessment

## 2018-04-15 NOTE — Progress Notes (Signed)
ANTICOAGULATION CONSULT NOTE   Pharmacy Consult for Heparin Indication: atrial fibrillation  Patient Measurements: Height: 5' 3.5" (161.3 cm) Weight: 160 lb (72.6 kg) IBW/kg (Calculated) : 53.55 Heparin Dosing Weight: 70 kg  Vital Signs: Temp: 98.6 F (37 C) (12/07 0301) Temp Source: Axillary (12/07 0301) BP: 124/73 (12/07 0301) Pulse Rate: 86 (12/07 0301)  Labs: Recent Labs    04/14/18 1106 04/14/18 1351 04/14/18 1352 04/14/18 2253 04/15/18 0252  HGB 8.8*  --   --   --  7.5*  HCT 29.0*  --   --   --  23.7*  PLT 367  --   --   --  323  APTT  --   --  42* 55* 59*  LABPROT 16.8*  --   --   --   --   INR 1.38  --   --   --   --   HEPARINUNFRC  --  1.80*  --   --  1.68*  CREATININE 1.16*  --   --   --  1.45*    Estimated Creatinine Clearance: 37.4 mL/min (A) (by C-G formula based on SCr of 1.45 mg/dL (H)).   Medical History: Past Medical History:  Diagnosis Date  . Abnormal EKG 07/31/2013  . Arthritis    "hands" (03/06/2015)  . Asthma   . Carpal tunnel syndrome, bilateral   . Chronic kidney disease   . Complication of anesthesia    slow to wake up. Pt sts she woke up during surgery many years ago.  . Coronary artery disease     2 v CAD with CTO of the RCA and high grade bifurcational LCx/OM stenosis. S/P PCI DES x 2 to the LCx/OM.  Marland Kitchen Diabetic peripheral neuropathy ([place]) "since 1996"  . GERD (gastroesophageal reflux disease)   . Goiter   . Headache    migraines prior to menopause  . History of shingles 06/01/2013  . Hyperlipidemia LDL goal <70 10/13/2015  . Hypertension   . Hyperthyroidism   . Osteomyelitis of foot ([facility])    Right  . PAF (paroxysmal atrial fibrillation) ([place]) 04/29/2015   CHADS2VASC score of 5 now on Apixaban  . Pneumonia ~ 1976  . Sleep apnea    Bipap  . Tremors of nervous system   . Type II diabetes mellitus ([facility])    insulin dependent    Medications:  Medications Prior to Admission  Medication Sig Dispense Refill Last Dose  .  Alcohol Swabs (B-D SINGLE USE SWABS REGULAR) PADS 1 each by Does not apply route 2 (two) times daily. 100 each 2 04/14/2018 at Unknown time  . amLODipine (NORVASC) 5 MG tablet Take 1 tablet (5 mg total) by mouth daily. 30 tablet 0 04/12/2018 at Unknown time  . apixaban (ELIQUIS) 5 MG TABS tablet Take 1 tablet (5 mg total) by mouth 2 (two) times daily. 180 tablet 3 04/12/2018 at Unknown time  . atorvastatin (LIPITOR) 20 MG tablet Take 1 tablet (20 mg total) by mouth daily. (Patient taking differently: Take 20 mg by mouth every evening. ) 90 tablet 3 04/12/2018  . Blood Glucose Calibration (TRUE METRIX LEVEL 1) Low SOLN 1 each by In Vitro route as needed. 1 each 0 04/14/2018 at Unknown time  . Blood Glucose Monitoring Suppl (TRUE METRIX AIR GLUCOSE METER) w/Device KIT 1 each by Does not apply route daily. 1 kit 0 04/14/2018 at Unknown time  . buPROPion (WELLBUTRIN SR) 150 MG 12 hr tablet Take 150 mg by mouth 2 (two) times  daily.   04/12/2018  . fenofibrate (TRICOR) 145 MG tablet Take 1 tablet (145 mg total) by mouth daily. (Patient taking differently: Take 145 mg by mouth every evening. ) 90 tablet 3 04/12/2018  . FLUoxetine (PROZAC) 20 MG capsule Take 1 capsule (20 mg total) by mouth at bedtime. 90 capsule 2 04/12/2018  . furosemide (LASIX) 20 MG tablet Take 3 tablets (60 mg total) by mouth daily. 273 tablet 3 04/12/2018  . gabapentin (NEURONTIN) 300 MG capsule Take 3 capsules (900 mg total) by mouth 2 (two) times daily. TAKE 3 CAPSULES BY MOUTH EVERY  MORNING AND 3 CAPSULES AT BEDTIME (Patient taking differently: Take 900 mg by mouth 2 (two) times daily. ) 540 capsule 0 04/12/2018  . glucose blood (TRUE METRIX BLOOD GLUCOSE TEST) test strip Used to check blood sugars 2x daily. 100 each 12 04/14/2018 at Unknown time  . insulin degludec (TRESIBA FLEXTOUCH) 100 UNIT/ML SOPN FlexTouch Pen Inject 0.15 mLs (15 Units total) into the skin daily. (Patient taking differently: Inject 20 Units into the skin every evening. ) 15  mL 0 04/12/2018  . Insulin Pen Needle 32G X 4 MM MISC Use to inject insulin daily 100 each 5 04/14/2018 at Unknown time  . Insulin Syringe-Needle U-100 (INSULIN SYRINGE .5CC/31GX5/16") 31G X 5/16" 0.5 ML MISC Use to inject insulin 100 each 5 04/14/2018 at Unknown time  . metFORMIN (GLUCOPHAGE-XR) 500 MG 24 hr tablet Take 4 tablets (2,000 mg total) by mouth daily with breakfast. (Patient taking differently: Take 1,000 mg by mouth 2 (two) times daily. ) 360 tablet 3 04/12/2018  . methimazole (TAPAZOLE) 10 MG tablet Take 1 tablet (10 mg total) by mouth daily. 30 tablet 11 04/12/2018  . metoprolol succinate (TOPROL-XL) 25 MG 24 hr tablet Take 1 tablet (25 mg total) by mouth 2 (two) times daily. Take with or immediately following a meal. 180 tablet 1 04/12/2018  . pantoprazole (PROTONIX) 40 MG tablet Take 1 tablet (40 mg total) by mouth daily. 90 tablet 0 04/12/2018  . potassium chloride SA (K-DUR,KLOR-CON) 20 MEQ tablet Take 1 tablet (20 mEq total) by mouth daily.   04/12/2018  . sotalol (BETAPACE) 120 MG tablet Take 1 tablet (120 mg total) by mouth every 12 (twelve) hours. 180 tablet 3 04/12/2018  . traMADol (ULTRAM) 50 MG tablet Take 50 mg by mouth every 8 (eight) hours as needed for moderate pain or severe pain.   5 04/12/2018  . TRUEPLUS LANCETS 28G MISC 1 each by Does not apply route 2 (two) times daily. 100 each 2 04/14/2018 at Unknown time  . albuterol (PROVENTIL HFA) 108 (90 Base) MCG/ACT inhaler Inhale 1-2 puffs into the lungs every 6 (six) hours as needed for wheezing or shortness of breath. 1 Inhaler 0 Unknown at Unknown time  . albuterol (PROVENTIL) (2.5 MG/3ML) 0.083% nebulizer solution Take 3 mLs (2.5 mg total) by nebulization every 6 (six) hours as needed for wheezing or shortness of breath. 150 mL 0 Unknown at Unknown time  . nitroGLYCERIN (NITROSTAT) 0.4 MG SL tablet Place 1 tablet (0.4 mg total) under the tongue every 5 (five) minutes as needed for chest pain. 25 tablet 3 Unknown at Unknown time  .  ondansetron (ZOFRAN-ODT) 8 MG disintegrating tablet Take 1 tablet (8 mg total) by mouth every 8 (eight) hours as needed for nausea or vomiting. 20 tablet 1 Unknown at Unknown time  . triamcinolone cream (KENALOG) 0.1 % Apply 1 application topically 2 (two) times daily. (Patient taking differently: Apply 1 application  topically 2 (two) times daily as needed (for skin rash/irritation.). ) 30 g 0 Unknown at Unknown time    Assessment: 67 y/o female who presents for right transtibial amputation and was found to have irregular heart rate; now being admitted. Patient takes apixaban chronically for hx Afib, last dose was 12/4. Pharmacy consulted to bridge with IV heparin. No bleeding noted, Hgb low at 8.8, platelets are normal. Will adjust heparin drip based on aPTT until effects of apixaban on heparin level have subsided.   12/7 AM update: aPTT is low despite rate increase, no issues per RN, surgery planned for this AM  Goal of Therapy:  Heparin level 0.3-0.7 units/ml aPTT 66-102 seconds Monitor platelets by anticoagulation protocol: Yes   Plan:  -Increase heparin to 1250 units/hr -Heparin will be off for surgery later this AM -F/U post-op anti-coagulation plans  Narda Bonds, PharmD, BCPS Clinical Pharmacist Phone: [phone]

## 2018-04-15 NOTE — Transfer of Care (Signed)
Immediate Anesthesia Transfer of Care Note  Patient: Judith [name]  Procedure(s) Performed: AMPUTATION BELOW KNEE (Right Leg Lower)  Patient Location: PACU  Anesthesia Type:GA combined with regional for post-op pain  Level of Consciousness: awake and drowsy  Airway & Oxygen Therapy: Patient Spontanous Breathing and Patient connected to nasal cannula oxygen  Post-op Assessment: Report given to RN and Post -op Vital signs reviewed and stable  Post vital signs: Reviewed and stable  Last Vitals:  Vitals Value Taken Time  BP 108/57 04/15/2018 10:00 AM  Temp    Pulse 80 04/15/2018 10:03 AM  Resp 16 04/15/2018 10:03 AM  SpO2 90 % 04/15/2018 10:03 AM  Vitals shown include unvalidated device data.  Last Pain:  Vitals:   04/15/18 0740  TempSrc: Oral  PainSc:       Patients Stated Pain Goal: 3 (08/21/62 3837)  Complications: No apparent anesthesia complications

## 2018-04-15 NOTE — Consult Note (Addendum)
Referring Provider:  Dr. Waldron Labs, [facility] Primary Care Physician:  Rutherford Guys, MD Primary Gastroenterologist:  Dr. Silverio Decamp  Reason for Consultation:  Anemia and black stools  HPI: Judith [name] is a 67 y.o. female with medical history significant of osteomyelitis of the right foot, chronic kidney disease, coronary artery disease, diabetes mellitus with peripheral neuropathy, paroxysmal atrial fibrillation on Eliquis, sleep apnea who presented to [facility] for surgery on her right foot due to osteomyelitis.  She has been complaining of fevers and chills with lowish blood pressures at home.  She saw Dr. Sharol Given who felt that she needed a likely revision of her transmetatarsal amputation due to osteomyelitis, which she had done today.  GI was consulted due to drop in Hgb and report of black stool.  Hgb was 11.9 grams just 2 months ago and was down to 7.5 grams this AM.  She is getting 2 units PRBC's.  She says that she had a black stool yesterday.  Had been taking a few NSAID's per day at home recently for the past week or so for some ear pain.  Sees occasional red blood upon wiping but contributes that to hemorrhoids.  Was on Eliquis but that is on hold.  Was on heparin gtt, but that is currently on hold as well.  Colonoscopy 10/2016 showed the following:  - One 5 mm polyp in the ascending colon, removed with a cold snare. Resected and retrieved. - Severe diverticulosis in the sigmoid colon, in the descending colon, in the transverse colon, in the ascending colon and in the cecum. There was narrowing of the colon in association with the diverticular opening. There was evidence of diverticular spasm. There was evidence of an impacted diverticulum. - Non-bleeding internal hemorrhoids.  EGD showed 10/2016 showed the following:  - Monilial esophagitis. Biopsied. - Small hiatal hernia. - Normal examined duodenum.   Past Medical History:  Diagnosis Date  . Abnormal EKG 07/31/2013  .  Arthritis    "hands" (03/06/2015)  . Asthma   . Carpal tunnel syndrome, bilateral   . Chronic kidney disease   . Complication of anesthesia    slow to wake up. Pt sts she woke up during surgery many years ago.  . Coronary artery disease     2 v CAD with CTO of the RCA and high grade bifurcational LCx/OM stenosis. S/P PCI DES x 2 to the LCx/OM.  Marland Kitchen Diabetic peripheral neuropathy ([place]) "since 1996"  . GERD (gastroesophageal reflux disease)   . Goiter   . Headache    migraines prior to menopause  . History of shingles 06/01/2013  . Hyperlipidemia LDL goal <70 10/13/2015  . Hypertension   . Hyperthyroidism   . Osteomyelitis of foot (HCC)    Right  . PAF (paroxysmal atrial fibrillation) ([place]) 04/29/2015   CHADS2VASC score of 5 now on Apixaban  . Pneumonia ~ 1976  . Sleep apnea    Bipap  . Tremors of nervous system   . Type II diabetes mellitus (HCC)    insulin dependent    Past Surgical History:  Procedure Laterality Date  . ABDOMINAL HYSTERECTOMY  1988   age 36; CERVICAL DYSPLASIA; ovaries intact.   . AMPUTATION Right 01/23/2016   Procedure: Right 3rd Ray Amputation;  Surgeon: [name], MD;  Location: [place];  Service: Orthopedics;  Laterality: Right;  . AMPUTATION Right 02/13/2016   Procedure: Right Transmetatarsal Amputation;  Surgeon: [name], MD;  Location: [place];  Service: Orthopedics;  Laterality: Right;  . CARDIAC CATHETERIZATION N/A 02/27/2015   Procedure: Left Heart Cath and Coronary Angiography;  Surgeon: [name], MD; LAD 40%, mCFX 80%, OM 70%, RCA 100% calcified       . CARDIAC CATHETERIZATION N/A 03/06/2015   Procedure: Coronary Stent Intervention;  Surgeon: [name], MD;  Location: [place] [facility];  Service: Cardiovascular;  Laterality: N/A;  Mid CX 3.50x12 promus DES w/ 0% resdual and Prox OM1 2.50x20 promus DES w/ 20% residual  . CARDIOVERSION    . CARPAL TUNNEL RELEASE Right Nov 2015  . CARPAL TUNNEL RELEASE Right 1992; 05/2014   [name];  [place]  . CESAREAN SECTION  1982; 1984  . FOOT NEUROMA SURGERY Bilateral 2000  . I&D EXTREMITY Left 01/06/2018   Procedure: DEBRIDEMENT ULCER LEFT FOOT;  Surgeon: [name], MD;  Location: [place];  Service: Orthopedics;  Laterality: Left;  . KNEE ARTHROSCOPY Right ~ 2003   "meniscus repair"  . SHOULDER OPEN ROTATOR CUFF REPAIR Right 1996; 1998   "w/fracture repair"  . THYROID SURGERY  2000   "removed lots of nodules"  . TONSILLECTOMY  1976    Prior to Admission medications   Medication Sig Start Date End Date Taking? Authorizing Provider  Alcohol Swabs (B-D SINGLE USE SWABS REGULAR) PADS 1 each by Does not apply route 2 (two) times daily. 08/29/17  Yes Renato Shin, MD  amLODipine (NORVASC) 5 MG tablet Take 1 tablet (5 mg total) by mouth daily. 01/11/18  Yes Regalado, Belkys A, MD  apixaban (ELIQUIS) 5 MG TABS tablet Take 1 tablet (5 mg total) by mouth 2 (two) times daily. 08/17/17  Yes Turner, Eber Hong, MD  atorvastatin (LIPITOR) 20 MG tablet Take 1 tablet (20 mg total) by mouth daily. Patient taking differently: Take 20 mg by mouth every evening.  08/22/17  Yes Turner, Eber Hong, MD  Blood Glucose Calibration (TRUE METRIX LEVEL 1) Low SOLN 1 each by In Vitro route as needed. 08/25/17  Yes Renato Shin, MD  Blood Glucose Monitoring Suppl (TRUE METRIX AIR GLUCOSE METER) w/Device KIT 1 each by Does not apply route daily. 08/25/17  Yes Renato Shin, MD  buPROPion [facility] SR) 150 MG 12 hr tablet Take 150 mg by mouth 2 (two) times daily.   Yes [provider]  fenofibrate (TRICOR) 145 MG tablet Take 1 tablet (145 mg total) by mouth daily. Patient taking differently: Take 145 mg by mouth every evening.  08/22/17 08/17/18 Yes Turner, Eber Hong, MD  FLUoxetine (PROZAC) 20 MG capsule Take 1 capsule (20 mg total) by mouth at bedtime. 08/31/17  Yes [name], PA-C  furosemide (LASIX) 20 MG tablet Take 3 tablets (60 mg total) by mouth daily. 02/01/18 02/01/19 Yes Turner, Eber Hong, MD    gabapentin (NEURONTIN) 300 MG capsule Take 3 capsules (900 mg total) by mouth 2 (two) times daily. TAKE 3 CAPSULES BY MOUTH EVERY  MORNING AND 3 CAPSULES AT BEDTIME Patient taking differently: Take 900 mg by mouth 2 (two) times daily.  08/31/17  Yes [name], PA-C  glucose blood (TRUE METRIX BLOOD GLUCOSE TEST) test strip Used to check blood sugars 2x daily. 08/25/17  Yes Renato Shin, MD  insulin degludec (TRESIBA FLEXTOUCH) 100 UNIT/ML SOPN FlexTouch Pen Inject 0.15 mLs (15 Units total) into the skin daily. Patient taking differently: Inject 20 Units into the skin every evening.  04/03/18  Yes Renato Shin, MD  Insulin Pen Needle 32G X 4 MM MISC Use to inject insulin daily  08/13/16  Yes Renato Shin, MD  Insulin Syringe-Needle U-100 (INSULIN SYRINGE .5CC/31GX5/16") 31G X 5/16" 0.5 ML MISC Use to inject insulin 08/13/16  Yes Renato Shin, MD  metFORMIN (GLUCOPHAGE-XR) 500 MG 24 hr tablet Take 4 tablets (2,000 mg total) by mouth daily with breakfast. Patient taking differently: Take 1,000 mg by mouth 2 (two) times daily.  08/25/17  Yes Renato Shin, MD  methimazole (TAPAZOLE) 10 MG tablet Take 1 tablet (10 mg total) by mouth daily. 08/25/17  Yes Renato Shin, MD  metoprolol succinate (TOPROL-XL) 25 MG 24 hr tablet Take 1 tablet (25 mg total) by mouth 2 (two) times daily. Take with or immediately following a meal. 03/28/18  Yes Turner, Traci R, MD  pantoprazole (PROTONIX) 40 MG tablet Take 1 tablet (40 mg total) by mouth daily. 08/31/17  Yes [name], PA-C  potassium chloride SA (K-DUR,KLOR-CON) 20 MEQ tablet Take 1 tablet (20 mEq total) by mouth daily. 01/11/18  Yes Bonnielee Haff, MD  sotalol (BETAPACE) 120 MG tablet Take 1 tablet (120 mg total) by mouth every 12 (twelve) hours. 08/22/17  Yes Turner, Eber Hong, MD  traMADol (ULTRAM) 50 MG tablet Take 50 mg by mouth every 8 (eight) hours as needed for moderate pain or severe pain.  02/02/18  Yes [provider]  TRUEPLUS LANCETS 28G  MISC 1 each by Does not apply route 2 (two) times daily. 08/29/17  Yes Renato Shin, MD  albuterol (PROVENTIL HFA) 108 (90 Base) MCG/ACT inhaler Inhale 1-2 puffs into the lungs every 6 (six) hours as needed for wheezing or shortness of breath. 11/07/15   [name], MD  albuterol (PROVENTIL) (2.5 MG/3ML) 0.083% nebulizer solution Take 3 mLs (2.5 mg total) by nebulization every 6 (six) hours as needed for wheezing or shortness of breath. 08/31/17   [name], PA-C  nitroGLYCERIN (NITROSTAT) 0.4 MG SL tablet Place 1 tablet (0.4 mg total) under the tongue every 5 (five) minutes as needed for chest pain. 02/10/17   [name], PA-C  ondansetron (ZOFRAN-ODT) 8 MG disintegrating tablet Take 1 tablet (8 mg total) by mouth every 8 (eight) hours as needed for nausea or vomiting. 02/06/18   Rutherford Guys, MD  triamcinolone cream (KENALOG) 0.1 % Apply 1 application topically 2 (two) times daily. Patient taking differently: Apply 1 application topically 2 (two) times daily as needed (for skin rash/irritation.).  10/07/17   [name], PA-C    Current Facility-Administered Medications  Medication Dose Route Frequency Provider Last Rate Last Dose  . 0.9 %  sodium chloride infusion (Manually program via Guardrails IV Fluids)   Intravenous Once [name], MD      . 0.9 %  sodium chloride infusion   Intravenous Continuous [name], MD      . Derrill Memo ON 04/16/2018] acetaminophen (TYLENOL) tablet 325-650 mg  325-650 mg Oral Q6H PRN [name], MD      . albuterol (PROVENTIL) (2.5 MG/3ML) 0.083% nebulizer solution 2.5 mg  2.5 mg Nebulization Q6H PRN [name], MD      . amLODipine (NORVASC) tablet 5 mg  5 mg Oral Daily [name], MD   5 mg at 04/14/18 1723  . atorvastatin (LIPITOR) tablet 20 mg  20 mg Oral QPM [name], MD   20 mg at 04/14/18 1722  . bisacodyl (DULCOLAX) suppository 10 mg  10 mg Rectal Daily PRN [name], MD      . buPROPion [facility] SR) 12  hr  tablet 150 mg  150 mg Oral BID [name], MD   150 mg at 04/14/18 2148  . diltiazem (CARDIZEM) 100 mg in dextrose 5% 162m (1 mg/mL) infusion  5-15 mg/hr Intravenous Titrated DNewt Minion MD 10 mL/hr at 04/15/18 0919 10 mg/hr at 04/15/18 0919  . docusate sodium (COLACE) capsule 100 mg  100 mg Oral BID DNewt Minion MD      . fenofibrate tablet 160 mg  160 mg Oral Daily DNewt Minion MD      . fentaNYL (SUBLIMAZE) injection 50 mcg  50 mcg Intravenous Q4H PRN DNewt Minion MD   50 mcg at 04/14/18 2150  . FLUoxetine (PROZAC) capsule 20 mg  20 mg Oral QHS DNewt Minion MD   20 mg at 04/14/18 2149  . furosemide (LASIX) tablet 60 mg  60 mg Oral Daily DNewt Minion MD   60 mg at 04/14/18 2203  . gabapentin (NEURONTIN) capsule 900 mg  900 mg Oral BID DNewt Minion MD   900 mg at 04/14/18 2149  . heparin ADULT infusion 100 units/mL (25000 units/2534msodium chloride 0.45%)  1,250 Units/hr Intravenous Continuous Carney, JeGay FillerRPH      . HYDROcodone-acetaminophen (NORCO) 7.5-325 MG per tablet 1-2 tablet  1-2 tablet Oral Q4H PRN DuNewt MinionMD      . HYDROcodone-acetaminophen (NORCO/VICODIN) 5-325 MG per tablet 1-2 tablet  1-2 tablet Oral Q4H PRN DuNewt MinionMD      . insulin glargine (LANTUS) injection 20 Units  20 Units Subcutaneous QHS DuNewt MinionMD   20 Units at 04/14/18 2204  . lactated ringers infusion   Intravenous Continuous DuNewt MinionMD   Stopped at 04/14/18 1900  . lactated ringers infusion   Intravenous Continuous DuNewt MinionMD 10 mL/hr at 04/15/18 [id number]  . magnesium citrate solution 1 Bottle  1 Bottle Oral Once PRN DuNewt MinionMD      . methimazole (TAPAZOLE) tablet 10 mg  10 mg Oral Daily DuNewt MinionMD   10 mg at 04/14/18 1723  . methocarbamol (ROBAXIN) tablet 500 mg  500 mg Oral Q6H PRN DuNewt MinionMD       Or  . methocarbamol (ROBAXIN) 500 mg in dextrose 5 % 50 mL IVPB  500 mg Intravenous Q6H PRN DuNewt MinionMD      .  metoCLOPramide (REGLAN) tablet 5-10 mg  5-10 mg Oral Q8H PRN DuNewt MinionMD       Or  . metoCLOPramide (REGLAN) injection 5-10 mg  5-10 mg Intravenous Q8H PRN DuNewt MinionMD      . metoprolol succinate (TOPROL-XL) 24 hr tablet 25 mg  25 mg Oral BID DuNewt MinionMD   25 mg at 04/14/18 2149  . morphine 2 MG/ML injection 0.5-1 mg  0.5-1 mg Intravenous Q2H PRN DuNewt MinionMD      . nitroGLYCERIN (NITROSTAT) SL tablet 0.4 mg  0.4 mg Sublingual Q5 min PRN DuNewt MinionMD      . ondansetron (ZCataract And Laser Center Associates Pctablet 4 mg  4 mg Oral Q6H PRN DuNewt MinionMD       Or  . ondansetron (ZNorth [facility] Orthopa[facility] Amb[facility] Surgery Center LLCinjection 4 mg  4 mg Intravenous Q6H PRN DuNewt MinionMD   4 mg at 04/14/18 1534  . ondansetron (ZOFRAN) tablet 4 mg  4 mg Oral Q6H PRN DuNewt MinionMD  Or  . ondansetron (ZOFRAN) injection 4 mg  4 mg Intravenous Q6H PRN [name], MD      . pantoprazole (PROTONIX) 80 mg in sodium chloride 0.9 % 100 mL IVPB  80 mg Intravenous Once Elgergawy, Silver Huguenin, MD      . pantoprazole (PROTONIX) 80 mg in sodium chloride 0.9 % 250 mL (0.32 mg/mL) infusion  8 mg/hr Intravenous Continuous Elgergawy, Silver Huguenin, MD      . pantoprazole (PROTONIX) EC tablet 40 mg  40 mg Oral BID Elgergawy, Silver Huguenin, MD      . Derrill Memo ON 04/19/2018] pantoprazole (PROTONIX) injection 40 mg  40 mg Intravenous Q12H Elgergawy, Dawood S, MD      . polyethylene glycol (MIRALAX / GLYCOLAX) packet 17 g  17 g Oral Daily PRN [name], MD      . potassium chloride SA (K-DUR,KLOR-CON) CR tablet 20 mEq  20 mEq Oral Daily [name], MD   20 mEq at 04/14/18 1722  . sotalol (BETAPACE) tablet 120 mg  120 mg Oral Q12H [name], MD   120 mg at 04/14/18 2148  . vancomycin (VANCOCIN) IVPB 750 mg/150 ml premix  750 mg Intravenous Q24H [name], MD        Allergies as of 04/13/2018 - Review Complete 04/13/2018  Allergen Reaction Noted  . Contrast media [iodinated diagnostic agents] Hives and Other (See Comments) 08/07/2017  .  Novolog [insulin aspart] Shortness Of Breath and Other (See Comments) 02/27/2015  . Propofol Shortness Of Breath 11/26/2015  . Codeine Nausea And Vomiting and Other (See Comments) 09/28/2013  . Iodine Other (See Comments) 01/30/2013  . Penicillins Itching, Rash, and Other (See Comments) 01/30/2013  . Ace inhibitors Cough 03/13/2015  . Demerol [meperidine] Nausea And Vomiting 08/15/2015  . Dilaudid [hydromorphone hcl] Other (See Comments) 05/19/2016  . Neosporin [neomycin-bacitracin zn-polymyx] Itching, Rash, and Other (See Comments) 01/30/2013  . Percocet [oxycodone-acetaminophen] Rash 08/15/2015  . Tape Itching and Rash 01/30/2013    Family History  Problem Relation Age of Onset  . Cancer Mother 64       bronchial cancer  . Breast cancer Mother   . Lung cancer Mother   . Hypertension Father   . COPD Father   . Heart disease Father 73       CAD with cardiac stenting  . Heart attack Father   . Parkinson's disease Father   . Allergies Sister   . Breast cancer Maternal Grandmother   . Emphysema Maternal Grandfather   . Leukemia Paternal Grandmother   . Emphysema Paternal Grandfather   . Thyroid disease Neg Hx     Social History   Socioeconomic History  . Marital status: Single    Spouse name: Not on file  . Number of children: 2  . Years of education: Masters  . Highest education level: Not on file  Occupational History  . Occupation: Landscape architect  Social Needs  . Financial resource strain: Somewhat hard  . Food insecurity:    Worry: Sometimes true    Inability: Sometimes true  . Transportation needs:    Medical: No    Non-medical: No  Tobacco Use  . Smoking status: Former Smoker    Packs/day: 0.00    Years: 41.00    Pack years: 0.00    Types: Cigarettes    Last attempt to quit: 03/05/2015    Years since quitting: 3.1  . Smokeless tobacco: Never Used  . Tobacco comment: 04/29/2015 "  quit smoking cigarettes 02/27/2015"  Substance and Sexual Activity  .  Alcohol use: No  . Drug use: No  . Sexual activity: Not Currently    Birth control/protection: Post-menopausal, Surgical  Lifestyle  . Physical activity:    Days per week: 0 days    Minutes per session: 0 min  . Stress: Very much  Relationships  . Social connections:    Talks on phone: More than three times a week    Gets together: More than three times a week    Attends religious service: More than 4 times per year    Active member of club or organization: No    Attends meetings of clubs or organizations: Never    Relationship status: Never married  . Intimate partner violence:    Fear of current or ex partner: No    Emotionally abused: No    Physically abused: No    Forced sexual activity: No  Other Topics Concern  . 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      Lives with sister in [place].      Tobacco: 1 ppd x 41 years - quit 2016      Alcohol: none      Drugs: none      Exercise:  Walking in neighborhood; physical job.   Right-handed.   2 cups caffeine daily.    Review of Systems: ROS is O/W negative except as mentioned in HPI.  Physical Exam: Vital signs in last 24 hours: Temp:  [97.5 F (36.4 C)-100 F (37.8 C)] 97.9 F (36.6 C) (12/07 1358) Pulse Rate:  [79-157] 83 (12/07 1358) Resp:  [13-25] 21 (12/07 1358) BP: (95-124)/(57-86) 118/86 (12/07 1358) SpO2:  [71 %-100 %] 96 % (12/07 1358) Last BM Date: 04/14/18 General:  Alert, Well-developed, well-nourished, pleasant and cooperative in NAD Head:  Normocephalic and atraumatic. Eyes:  Sclera clear, no icterus.  Conjunctiva pink. Ears:  Normal auditory acuity. Mouth:  No deformity or lesions.   Lungs:  Clear throughout to auscultation.  No wheezes, crackles, or rhonchi.  Heart:   Regular rate and rhythm; no murmurs, clicks, rubs, or gallops. Abdomen:  Soft, non-distended.  BS present.  Non-tender.   Pulses:  Normal pulses noted. Extremities:  S/p right mid-foot amputation. Neurologic:  Alert and oriented x 4;  grossly normal neurologically. Skin:  Intact without significant lesions or rashes. Psych:  Alert and cooperative. Normal mood and affect.  Intake/Output from previous day: 12/06 0701 - 12/07 0700 In: 1315.4 [P.O.:120; I.V.:1195.4] Out: -  Intake/Output this shift: Total I/O In: 400 [I.V.:400] Out: 20 [Blood:20]  Lab Results: Recent Labs    04/14/18 1106 04/15/18 0252  WBC 8.7 7.1  HGB 8.8* 7.5*  HCT 29.0* 23.7*  PLT 367 323   BMET Recent Labs    04/14/18 1106 04/15/18 0252  NA 130* 132*  K 2.8* 3.7  CL 92* 96*  CO2 23 24  GLUCOSE 197* 181*  BUN 19 26*  CREATININE 1.16* 1.45*  CALCIUM 8.2* 7.7*   PT/INR Recent Labs    04/14/18 1106  LABPROT 16.8*  INR 1.38   IMPRESSION:  *Anemia and reported melena:  Reports using NSAID's in the form of naproxen several a day recently.  Hgb 7.5  grams this AM compared to 11.9 grams just 2 months ago.  Receiving 2 units PRBC's. *Atrial fibrillation on Eliquis:  This has been on hold.  But is now on heparin. *Osteomyelitis abscess with right midfoot amputation today.  PLAN: *Will plan for EGD on 12/8. *Continue to hold Eliquis.  Will hold heparin for 6 hours prior to EGD. *Continue PPI gtt for now. *Monitor Hgb and transfuse further prn needed. Laban Emperor. Zehr  04/15/2018, 2:03 PM  GI ATTENDING  History, laboratories, x-rays reviewed.  Patient personally seen and examined.  67 year old with multiple medical problems.  Agree with comprehensive consultation note as outlined above.  Patient appears to have had a hemodynamically stable upper GI bleed in the face of chronic anticoagulation therapy.  Agree with transfusion and holding anticoagulation therapy for now.  Continue PPI drip.  Plan upper  endoscopy in the morning.The nature of the procedure, as well as the risks, benefits, and alternatives were carefully and thoroughly reviewed with the patient. Ample time for discussion and questions allowed. The patient understood, was satisfied, and agreed to proceed.  The patient is high risk given her age, comorbidities, and the need for chronic anticoagulation therapy.Docia Chuck Geri [place]in[place]., M.D. [facility] Division of Gastroenterology

## 2018-04-15 NOTE — Anesthesia Postprocedure Evaluation (Signed)
Anesthesia Post Note  Patient: Judith [name]  Procedure(s) Performed: AMPUTATION BELOW KNEE (Right Leg Lower)     Patient location during evaluation: PACU Anesthesia Type: General Level of consciousness: awake and alert Pain management: pain level controlled Vital Signs Assessment: post-procedure vital signs reviewed and stable Respiratory status: spontaneous breathing, nonlabored ventilation, respiratory function stable and patient connected to nasal cannula oxygen Cardiovascular status: blood pressure returned to baseline and stable Postop Assessment: no apparent nausea or vomiting Anesthetic complications: no    Last Vitals:  Vitals:   04/15/18 1056 04/15/18 1130  BP: (!) 111/59 122/81  Pulse: 82 82  Resp: 17 18  Temp: 36.7 C 36.7 C  SpO2: 98% 100%    Last Pain:  Vitals:   04/15/18 1130  TempSrc: Oral  PainSc:                  [name]

## 2018-04-16 ENCOUNTER — Inpatient Hospital Stay (HOSPITAL_COMMUNITY): Payer: Medicare PPO | Admitting: Certified Registered Nurse Anesthetist

## 2018-04-16 ENCOUNTER — Encounter (HOSPITAL_COMMUNITY)
Admission: RE | Disposition: A | Discharge status: Home / Self Care | Payer: Self-pay | Source: Home / Self Care | Attending: Internal Medicine

## 2018-04-16 ENCOUNTER — Encounter (HOSPITAL_COMMUNITY): Payer: Self-pay | Admitting: Orthopedic Surgery

## 2018-04-16 DIAGNOSIS — K922 Gastrointestinal hemorrhage, unspecified: Secondary | ICD-10-CM

## 2018-04-16 DIAGNOSIS — K269 Duodenal ulcer, unspecified as acute or chronic, without hemorrhage or perforation: Secondary | ICD-10-CM

## 2018-04-16 HISTORY — PX: ESOPHAGOGASTRODUODENOSCOPY (EGD) WITH PROPOFOL: SHX5813

## 2018-04-16 HISTORY — PX: BIOPSY: SHX5522

## 2018-04-16 LAB — CBC
HCT: 24.6 % — ABNORMAL LOW (ref 36.0–46.0)
Hemoglobin: 7.7 g/dL — ABNORMAL LOW (ref 12.0–15.0)
MCH: 26.8 pg (ref 26.0–34.0)
MCHC: 31.3 g/dL (ref 30.0–36.0)
MCV: 85.7 fL (ref 80.0–100.0)
PLATELETS: 294 10*3/uL (ref 150–400)
RBC: 2.87 MIL/uL — ABNORMAL LOW (ref 3.87–5.11)
RDW: 16.5 % — ABNORMAL HIGH (ref 11.5–15.5)
WBC: 6.8 10*3/uL (ref 4.0–10.5)
nRBC: 0 % (ref 0.0–0.2)

## 2018-04-16 LAB — BASIC METABOLIC PANEL
Anion gap: 13 (ref 5–15)
BUN: 31 mg/dL — ABNORMAL HIGH (ref 8–23)
CO2: 20 mmol/L — ABNORMAL LOW (ref 22–32)
Calcium: 7.6 mg/dL — ABNORMAL LOW (ref 8.9–10.3)
Chloride: 98 mmol/L (ref 98–111)
Creatinine, Ser: 1.79 mg/dL — ABNORMAL HIGH (ref 0.44–1.00)
GFR calc Af Amer: 34 mL/min — ABNORMAL LOW (ref >60)
GFR calc non Af Amer: 29 mL/min — ABNORMAL LOW (ref >60)
Glucose, Bld: 132 mg/dL — ABNORMAL HIGH (ref 70–99)
POTASSIUM: 3.9 mmol/L (ref 3.5–5.1)
Sodium: 131 mmol/L — ABNORMAL LOW (ref 135–145)

## 2018-04-16 LAB — WOUND CULTURE
MICRO NUMBER: 91457956
SPECIMEN QUALITY:: ADEQUATE

## 2018-04-16 LAB — GLUCOSE, CAPILLARY
GLUCOSE-CAPILLARY: 87 mg/dL (ref 70–99)
Glucose-Capillary: 70 mg/dL (ref 70–99)
Glucose-Capillary: 150 mg/dL — ABNORMAL HIGH (ref 70–99)
Glucose-Capillary: 118 mg/dL — ABNORMAL HIGH (ref 70–99)

## 2018-04-16 LAB — HEPARIN LEVEL (UNFRACTIONATED)
Heparin Unfractionated: 1 IU/mL — ABNORMAL HIGH (ref 0.30–0.70)
Heparin Unfractionated: 1.1 IU/mL — ABNORMAL HIGH (ref 0.30–0.70)

## 2018-04-16 LAB — APTT
aPTT: 48 seconds — ABNORMAL HIGH (ref 24–36)
aPTT: 58 seconds — ABNORMAL HIGH (ref 24–36)

## 2018-04-16 LAB — PREPARE RBC (CROSSMATCH)

## 2018-04-16 SURGERY — ESOPHAGOGASTRODUODENOSCOPY (EGD) WITH PROPOFOL
Anesthesia: Monitor Anesthesia Care

## 2018-04-16 MED ORDER — PROPOFOL 500 MG/50ML IV EMUL
INTRAVENOUS | Status: DC | PRN
  Administered 2018-04-16: 100 ug/kg/min via INTRAVENOUS

## 2018-04-16 MED ORDER — FUROSEMIDE 10 MG/ML IJ SOLN
40.0000 mg | Freq: Once | INTRAMUSCULAR | Status: AC
  Administered 2018-04-16: 40 mg via INTRAVENOUS
  Filled 2018-04-16: qty 4

## 2018-04-16 MED ORDER — HEPARIN (PORCINE) 25000 UT/250ML-% IV SOLN
1500.0000 [IU]/h | INTRAVENOUS | Status: DC
  Administered 2018-04-16 (×2): 1400 [IU]/h via INTRAVENOUS
  Filled 2018-04-16: qty 250

## 2018-04-16 MED ORDER — SODIUM CHLORIDE 0.9 % IV SOLN: INTRAVENOUS | Status: DC

## 2018-04-16 MED ORDER — SODIUM CHLORIDE 0.9% IV SOLUTION: Freq: Once | INTRAVENOUS | Status: DC

## 2018-04-16 MED ORDER — PROPOFOL 10 MG/ML IV BOLUS
INTRAVENOUS | Status: DC | PRN
  Administered 2018-04-16: 20 mg via INTRAVENOUS

## 2018-04-16 SURGICAL SUPPLY — 15 items

## 2018-04-16 NOTE — Anesthesia Postprocedure Evaluation (Signed)
Anesthesia Post Note  Patient: Judith [name]  Procedure(s) Performed: ESOPHAGOGASTRODUODENOSCOPY (EGD) WITH PROPOFOL (N/A ) BIOPSY     Patient location during evaluation: PACU Anesthesia Type: MAC Level of consciousness: awake and alert Pain management: pain level controlled Vital Signs Assessment: post-procedure vital signs reviewed and stable Respiratory status: spontaneous breathing, nonlabored ventilation, respiratory function stable and patient connected to nasal cannula oxygen Cardiovascular status: stable and blood pressure returned to baseline Postop Assessment: no apparent nausea or vomiting Anesthetic complications: no    Last Vitals:  Vitals:   04/16/18 1138 04/16/18 1140  BP: (!) 82/48 (!) 92/35  Pulse: 64 64  Resp: 18 18  Temp: 36.7 C   SpO2: 97% 99%    Last Pain:  Vitals:   04/16/18 1140  TempSrc:   PainSc: 0-No pain                 Stefen Juba DAVID

## 2018-04-16 NOTE — Evaluation (Signed)
Physical Therapy Evaluation Patient Details Name: Judith [name] MRN: [id number] DOB: 12/10/1950 Today's Date: 04/16/2018   History of Present Illness  Pt is a 67 y.o. F with significant PMH of osteomyelitis of right foot, chronic kidney disease, CAD, diabetes mellitus with peripheral neuropathy, paroxysmal atrial fibrillation, who presents s/p right transtibial amputation.  Clinical Impression  Patient admitted with above diagnosis. Prior to admission, patient lives with her sister and states she is independent with household ambulation and ADL's. She also has a ramped entrance to her home. On PT evaluation, patient presenting with decreased functional mobility secondary to weakness and balance impairments. Performing sit to stand transfers with two person moderate assistance and low pivot transfers from bed to chair with minimal assistance. Educated on prosthetic expectations and positioning. Will benefit from CIR to maximize functional independence. Suspect patient will progress well based on PLOF, motivation, and family support.    Follow Up Recommendations CIR    Equipment Recommendations  Wheelchair (measurements PT)    Recommendations for Other Services OT consult;Rehab consult     Precautions / Restrictions Precautions Precautions: Fall Precaution Comments: right BKA Required Braces or Orthoses: Other Brace Other Brace: limb guard Restrictions Weight Bearing Restrictions: Yes RLE Weight Bearing: Non weight bearing      Mobility  Bed Mobility Overal bed mobility: Needs Assistance Bed Mobility: Supine to Sit     Supine to sit: Min assist     General bed mobility comments: light min assist up to elevate trunk with use of bed rail  Transfers Overall transfer level: Needs assistance Equipment used: Rolling walker (2 wheeled) Transfers: Sit to/from [employer] Sit to Stand: Mod assist;+2 physical assistance   Squat pivot transfers: Min assist;+2  safety/equipment     General transfer comment: Pt requiring modA + 2 to boost up to stand with increased time and effort. cues for hand positioning. Pt unable to achieve significant foot clearance with attempt to hop in place. Sat back down onto bed and performed low pivot towards left with min assist   Ambulation/Gait                Stairs            Wheelchair Mobility    Modified Rankin (Stroke Patients Only)       Balance Overall balance assessment: Needs assistance Sitting-balance support: Feet unsupported;No upper extremity supported Sitting balance-Leahy Scale: Fair     Standing balance support: Bilateral upper extremity supported Standing balance-Leahy Scale: Poor                               Pertinent Vitals/Pain Pain Assessment: Faces Faces Pain Scale: Hurts a little bit Pain Location: residual limb Pain Descriptors / Indicators: Grimacing;Guarding Pain Intervention(s): Monitored during session    Home Living Family/patient expects to be discharged to:: Private residence Living Arrangements: Other relatives(sister) Available Help at Discharge: Family;Available PRN/intermittently Type of Home: House Home Access: Ramped entrance     Home Layout: Two level;Laundry or work area in [place]: Environmental consultant - 2 wheels;Bedside commode;Other (comment)(BSC used in shower)      Prior Function Level of Independence: Independent         Comments: reports independence with mobility and ADL's within last month      Hand Dominance   Dominant Hand: Right    Extremity/Trunk Assessment   Upper Extremity Assessment Upper Extremity Assessment: Defer to OT evaluation  Lower Extremity Assessment Lower Extremity Assessment: RLE deficits/detail;LLE deficits/detail RLE Deficits / Details: s/p BKA. Grossly at least anti gravity throughout LLE Deficits / Details: strength 5/5       Communication   Communication: No difficulties   Cognition Arousal/Alertness: Awake/alert Behavior During Therapy: WFL for tasks assessed/performed Overall Cognitive Status: Within Functional Limits for tasks assessed                                 General Comments: Pt and pt daughter can be tangential      General Comments      Exercises     Assessment/Plan    PT Assessment Patient needs continued PT services  PT Problem List Decreased strength;Decreased activity tolerance;Decreased balance;Decreased mobility       PT Treatment Interventions DME instruction;Gait training;Functional mobility training;Therapeutic activities;Therapeutic exercise;Balance training;Patient/family education;Wheelchair mobility training    PT Goals (Current goals can be found in the Care Plan section)  Acute Rehab PT Goals Patient Stated Goal: "get a prosthetic." PT Goal Formulation: With patient/family Time For Goal Achievement: 04/30/18 Potential to Achieve Goals: Good    Frequency Min 3X/week   Barriers to discharge        Co-evaluation               AM-PAC PT "6 Clicks" Mobility  Outcome Measure Help needed turning from your back to your side while in a flat bed without using bedrails?: None Help needed moving from lying on your back to sitting on the side of a flat bed without using bedrails?: A Lot Help needed moving to and from a bed to a chair (including a wheelchair)?: A Lot Help needed standing up from a chair using your arms (e.g., wheelchair or bedside chair)?: A Lot Help needed to walk in hospital room?: Total Help needed climbing 3-5 steps with a railing? : Total 6 Click Score: 12    End of Session Equipment Utilized During Treatment: Gait belt Activity Tolerance: Patient tolerated treatment well Patient left: in chair;with call bell/phone within reach;with family/visitor present;with chair alarm set Nurse Communication: Mobility status PT Visit Diagnosis: Unsteadiness on feet (R26.81);Other  abnormalities of gait and mobility (R26.89)    Time: 2683-4196 PT Time Calculation (min) (ACUTE ONLY): 33 min   Charges:   PT Evaluation $PT Eval Moderate Complexity: 1 Mod PT Treatments $Therapeutic Activity: 8-22 mins       Ellamae Sia, PT, DPT Acute Rehabilitation Services Pager [phone] Office [phone]   Willy Eddy 04/16/2018, 10:19 AM

## 2018-04-16 NOTE — NC FL2 (Signed)
[place] MEDICAID FL2 LEVEL OF CARE SCREENING TOOL     IDENTIFICATION  Patient Name: Judith [name] Birthdate: 1951/04/04 Sex: female Admission Date (Current Location): 04/14/2018  [facility] and [place] Number:  Herbalist and Address:  [facility] [place], [place] [address], [place], [place] [address]      Provider Number: [id number]  Attending Physician Name and Address:  Elgergawy, Silver Huguenin, MD  Relative Name and Phone Number:       Current Level of Care: Hospital Recommended Level of Care: [place] Prior Approval Number:    Date Approved/Denied:   PASRR Number: [id number]  Discharge Plan: SNF    Current Diagnoses: Patient Active Problem List   Diagnosis Date Noted  . Atrial fibrillation with RVR ([place]) 04/14/2018  . Foot osteomyelitis, right ([place]) 04/14/2018  . Cutaneous abscess of left foot   . Acute respiratory failure with hypoxia ([place])   . Cellulitis 01/02/2018  . LGI bleed   . Acute blood loss anemia   . Wide-complex tachycardia ([place]) 07/04/2017  . Type II diabetes mellitus, uncontrolled ([place]) 07/04/2017  . Peripheral neuropathy 07/04/2017  . H/O hyperthyroidism 07/04/2017  . S/P transmetatarsal amputation of foot, right ([place]) 04/19/2016  . Obstructive sleep apnea 11/26/2015  . Bilateral carpal tunnel syndrome 11/26/2015  . Hypomagnesemia 11/16/2015  . Hyperlipidemia LDL goal <70 10/13/2015  . Abnormality of gait 09/02/2015  . Memory loss 08/12/2015  . Diabetic peripheral neuropathy ([place]) 08/12/2015  . Vitamin D deficiency 08/12/2015  . Hyperthyroidism 04/30/2015  . Heme positive stool   . Persistent atrial fibrillation 04/29/2015  . Coronary artery disease with stable angina pectoris ([place]) 03/29/2015  . Abnormal nuclear stress test   . History of goiter 09/28/2014  . GERD (gastroesophageal reflux disease) 07/31/2013  . Depression with anxiety 06/01/2013  . DM (diabetes mellitus), type 2 with peripheral  vascular complications ([place]) 62/56/3893  . HTN (hypertension) 01/30/2013    Orientation RESPIRATION BLADDER Height & Weight     Self, Time, Situation, Place  Other (Comment)(CPAP 6L) Continent Weight: 160 lb (72.6 kg) Height:  5' 3.5" (161.3 cm)  BEHAVIORAL SYMPTOMS/MOOD NEUROLOGICAL BOWEL NUTRITION STATUS      Continent Diet(NPO at this time, subject to change, please see discharge summary)  AMBULATORY STATUS COMMUNICATION OF NEEDS Skin   Limited Assist Verbally Wound Vac(Right Leg)                       Personal Care Assistance Level of Assistance  Dressing, Feeding, Bathing Bathing Assistance: Limited assistance Feeding assistance: Independent Dressing Assistance: Limited assistance     Functional Limitations Info  Sight, Hearing, Speech Sight Info: Adequate Hearing Info: Adequate Speech Info: Adequate    SPECIAL CARE FACTORS FREQUENCY  PT (By licensed PT), OT (By licensed OT)                    Contractures Contractures Info: Not present    Additional Factors Info  Code Status, Allergies Code Status Info: Full Code Allergies Info: Contrast Media Iodinated Diagnostic Agents, Novolog Insulin Aspart, Propofol, Codeine, Iodine, Penicillins, Ace Inhibitors, Demerol Meperidine, Dilaudid Hydromorphone Hcl, Neosporin Neomycin-bacitracin Zn-polymyx, Percocet Oxycodone-acetaminophen, Tape           Current Medications (04/16/2018):  This is the current hospital active medication list Current Facility-Administered Medications  Medication Dose Route Frequency Provider Last Rate Last Dose  . 0.9 %  sodium chloride infusion (Manually program via Guardrails IV Fluids)  Intravenous Once [name], MD      . 0.9 %  sodium chloride infusion (Manually program via Guardrails IV Fluids)   Intravenous Once Elgergawy, Silver Huguenin, MD      . 0.9 %  sodium chloride infusion   Intravenous Continuous [name], MD      . 0.9 %  sodium chloride infusion   Intravenous  Continuous Zehr, Laban Emperor, PA-C      . acetaminophen (TYLENOL) tablet 325-650 mg  325-650 mg Oral Q6H PRN [name], MD      . albuterol (PROVENTIL) (2.5 MG/3ML) 0.083% nebulizer solution 2.5 mg  2.5 mg Nebulization Q6H PRN [name], MD   2.5 mg at 04/15/18 1707  . atorvastatin (LIPITOR) tablet 20 mg  20 mg Oral QPM [name], MD   20 mg at 04/15/18 1757  . bisacodyl (DULCOLAX) suppository 10 mg  10 mg Rectal Daily PRN [name], MD      . buPROPion [facility] SR) 12 hr tablet 150 mg  150 mg Oral BID [name], MD   150 mg at 04/15/18 2107  . diltiazem (CARDIZEM) 100 mg in dextrose 5% 179mL (1 mg/mL) infusion  5-15 mg/hr Intravenous Titrated [name], MD   Stopped at 04/15/18 1455  . docusate sodium (COLACE) capsule 100 mg  100 mg Oral BID [name], MD      . fenofibrate tablet 160 mg  160 mg Oral Daily [name], MD   160 mg at 04/15/18 1428  . fentaNYL (SUBLIMAZE) injection 50 mcg  50 mcg Intravenous Q4H PRN [name], MD   50 mcg at 04/14/18 2150  . FLUoxetine (PROZAC) capsule 20 mg  20 mg Oral QHS [name], MD   20 mg at 04/15/18 2109  . gabapentin (NEURONTIN) capsule 900 mg  900 mg Oral BID [name], MD   900 mg at 04/15/18 2108  . HYDROcodone-acetaminophen (NORCO) 7.5-325 MG per tablet 1-2 tablet  1-2 tablet Oral Q4H PRN [name], MD      . HYDROcodone-acetaminophen (NORCO/VICODIN) 5-325 MG per tablet 1-2 tablet  1-2 tablet Oral Q4H PRN [name], MD   1 tablet at 04/15/18 2118  . insulin glargine (LANTUS) injection 20 Units  20 Units Subcutaneous QHS [name], MD   20 Units at 04/15/18 2110  . lactated ringers infusion   Intravenous Continuous [name], MD   Stopped at 04/14/18 1900  . lactated ringers infusion   Intravenous Continuous [name], MD 10 mL/hr at 04/15/18 2300    . magnesium citrate solution 1 Bottle  1 Bottle Oral Once PRN [name], MD      . methimazole (TAPAZOLE) tablet 10 mg  10  mg Oral Daily [name], MD   10 mg at 04/15/18 1430  . methocarbamol (ROBAXIN) tablet 500 mg  500 mg Oral Q6H PRN [name], MD       Or  . methocarbamol (ROBAXIN) 500 mg in dextrose 5 % 50 mL IVPB  500 mg Intravenous Q6H PRN [name], MD      . metoCLOPramide (REGLAN) tablet 5-10 mg  5-10 mg Oral Q8H PRN [name], MD       Or  . metoCLOPramide (REGLAN) injection 5-10 mg  5-10 mg Intravenous Q8H PRN [name], MD      . metoprolol succinate (TOPROL-XL) 24 hr tablet  25 mg  25 mg Oral BID Elgergawy, Silver Huguenin, MD   25 mg at 04/15/18 1449  . morphine 2 MG/ML injection 0.5-1 mg  0.5-1 mg Intravenous Q2H PRN [name], MD      . nitroGLYCERIN (NITROSTAT) SL tablet 0.4 mg  0.4 mg Sublingual Q5 min PRN [name], MD      . ondansetron [facility]) tablet 4 mg  4 mg Oral Q6H PRN [name], MD       Or  . ondansetron [facility]) injection 4 mg  4 mg Intravenous Q6H PRN [name], MD   4 mg at 04/14/18 1534  . ondansetron (ZOFRAN) tablet 4 mg  4 mg Oral Q6H PRN [name], MD       Or  . ondansetron [facility]) injection 4 mg  4 mg Intravenous Q6H PRN [name], MD      . pantoprazole (PROTONIX) 80 mg in sodium chloride 0.9 % 250 mL (0.32 mg/mL) infusion  8 mg/hr Intravenous Continuous Elgergawy, Silver Huguenin, MD 25 mL/hr at 04/16/18 0040 8 mg/hr at 04/16/18 0040  . pantoprazole (PROTONIX) EC tablet 40 mg  40 mg Oral BID Elgergawy, Silver Huguenin, MD   40 mg at 04/15/18 2109  . [START ON 04/19/2018] pantoprazole (PROTONIX) injection 40 mg  40 mg Intravenous Q12H Elgergawy, Silver Huguenin, MD      . polyethylene glycol (MIRALAX / GLYCOLAX) packet 17 g  17 g Oral Daily PRN [name], MD      . potassium chloride SA (K-DUR,KLOR-CON) CR tablet 20 mEq  20 mEq Oral Daily [name], MD   20 mEq at 04/15/18 1428  . sotalol (BETAPACE) tablet 120 mg  120 mg Oral Q12H [name], MD   120 mg at 04/15/18 2118  . vancomycin (VANCOCIN) IVPB 750 mg/150 ml premix  750 mg  Intravenous Q24H [name], MD 150 mL/hr at 04/15/18 1811 750 mg at 04/15/18 1811     Discharge Medications: Please see discharge summary for a list of discharge medications.  Relevant Imaging Results:  Relevant Lab Results:   Additional Information SSN: [id number]  Eileen Stanford, LCSW

## 2018-04-16 NOTE — Progress Notes (Signed)
Subjective: 1 Day Post-Op Procedure(s) (LRB): AMPUTATION BELOW KNEE (Right) Patient reports pain as mild.    Objective: Vital signs in last 24 hours: Temp:  [97.5 F (36.4 C)-98.8 F (37.1 C)] 97.6 F (36.4 C) (12/08 0800) Pulse Rate:  [42-83] 61 (12/08 0800) Resp:  [13-22] 19 (12/08 0800) BP: (101-138)/(57-86) 123/68 (12/08 0800) SpO2:  [85 %-100 %] 94 % (12/08 0800)  Intake/Output from previous day: 12/07 0701 - 12/08 0700 In: 1460 [P.O.:120; I.V.:1014; Blood:326] Out: 20 [Blood:20] Intake/Output this shift: No intake/output data recorded.  Recent Labs    04/14/18 1106 04/15/18 0252 04/15/18 1902 04/16/18 0057  HGB 8.8* 7.5* 7.9* 7.7*   Recent Labs    04/15/18 0252 04/15/18 1902 04/16/18 0057  WBC 7.1  --  6.8  RBC 2.72*  --  2.87*  HCT 23.7* 25.1* 24.6*  PLT 323  --  294   Recent Labs    04/15/18 0252 04/16/18 0057  NA 132* 131*  K 3.7 3.9  CL 96* 98  CO2 24 20*  BUN 26* 31*  CREATININE 1.45* 1.79*  GLUCOSE 181* 132*  CALCIUM 7.7* 7.6*   Recent Labs    04/14/18 1106  INR 1.38    Neurologically intact Right knee painful with full extension. VAC dressing intact and VAC functioning well.  A-fib- Rates in 60's this am.   Assessment/Plan: 1 Day Post-Op Procedure(s) (LRB): AMPUTATION BELOW KNEE (Right)  Continue Prevena VAC May need to have cortisone injection to right knee as this helped previously as outpatient to facilitate extension at the knee.  Anemia- ? GI loss- Going for EGD today and transfusing. PPI gtt A-fib- rates in 60's this am.  DM with peripheral neuropathy OSA- CPAP  [employer] [phone]

## 2018-04-16 NOTE — Interval H&P Note (Signed)
History and Physical Interval Note:  04/16/2018 10:29 AM  Judith [name]  has presented today for surgery, with the diagnosis of Anemia, melena  The various methods of treatment have been discussed with the patient and family. After consideration of risks, benefits and other options for treatment, the patient has consented to  Procedure(s): ESOPHAGOGASTRODUODENOSCOPY (EGD) WITH PROPOFOL (N/A) as a surgical intervention .  The patient's history has been reviewed, patient examined, no change in status, stable for surgery.  I have reviewed the patient's chart and labs.  Questions were answered to the patient's satisfaction.     Judith [name]

## 2018-04-16 NOTE — Progress Notes (Signed)
ANTICOAGULATION CONSULT NOTE   Pharmacy Consult for Heparin Indication: atrial fibrillation  Patient Measurements: Height: 5' 3.5" (161.3 cm) Weight: 160 lb (72.6 kg) IBW/kg (Calculated) : 53.55 Heparin Dosing Weight: 70 kg  Vital Signs: Temp: 97.8 F (36.6 C) (12/08 2300) Temp Source: Axillary (12/08 2300) BP: 135/70 (12/08 2300) Pulse Rate: 66 (12/08 2300)  Labs: Recent Labs    04/14/18 1106  04/15/18 0252 04/15/18 1902 04/15/18 2248 04/16/18 0057 04/16/18 2140  HGB 8.8*  --  7.5* 7.9*  --  7.7*  --   HCT 29.0*  --  23.7* 25.1*  --  24.6*  --   PLT 367  --  323  --   --  294  --   APTT  --    < > 59*  --  48*  --  58*  LABPROT 16.8*  --   --   --   --   --   --   INR 1.38  --   --   --   --   --   --   HEPARINUNFRC  --    < > 1.68*  --  1.10* 1.00*  --   CREATININE 1.16*  --  1.45*  --   --  1.79*  --    < > = values in this interval not displayed.    Estimated Creatinine Clearance: 30.3 mL/min (A) (by C-G formula based on SCr of 1.79 mg/dL (H)).  Assessment: 67 y.o. female with h/o Afib, Eliquis on hold, for heparin  Goal of Therapy:  Heparin level 0.3-0.7 units/ml aPTT 66-102 seconds Monitor platelets by anticoagulation protocol: Yes   Plan:  Increase Heparin 1550 units/hr .Follow-up am labs.   Phillis Knack, PharmD, BCPS

## 2018-04-16 NOTE — Progress Notes (Signed)
PROGRESS NOTE                                                                                                                                                                                                             Patient Demographics:    Judith [name], is a 67 y.o. female, DOB - [birth year], LKG:[id number]  Admit date - 04/14/2018   Admitting Physician [name], MD  Outpatient Primary MD for the patient is Rutherford Guys, MD  LOS - 2   No chief complaint on file.      Brief Narrative   67 y.o. female with medical history significant of osteomyelitis of the right foot, chronic kidney disease, coronary artery disease, diabetes mellitus with peripheral neuropathy, paroxysmal atrial fibrillation, sleep apnea who presented  for surgery on her right foot due to osteomyelitis.  Found to have A. fib with RVR, her surgery was canceled, and [facility] were consulted, she was admitted under our service, requires Cardizem drip, was on heparin drip, she was noted to have melena, hemoglobin has dropped, patient endorses using NSAIDs for last 10 days.  Endoscopy 04/16/2018 significant for superficial multiple to have ulcerative esophagitis.   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  - Status post transtibial amputation by Dr.  Sharol Given 04/15/2018, empirically on IV antibiotics on admission, will discontinue. continue with PRN pain meds, PT consulted, commendation for CIR, inpatient consult rehab has been placed..  Paroxysmal atrial fibrillation with rapid ventricular response: - Patient will be admitted to the stepdown unit on a diltiazem drip for management of her rapid atrial fibrillation.  -Is on Eliquis, has been on hold for surgery, on heparin bridge. -She  is back on her Toprol-XL, sotalol, Cardizem drip has been stopped .  Acute blood loss anemia due to upper GI bleed -Hemoglobin 7.5 on admission, transfused 1 unit, 87.7 today, she will receive another unit today .  Upper GI bleed  -The setting of NSAIDs use, endoscopy 04/16/2018 significant for esophagitis and superficial duodenal ulcer, continue with PPI, patient instructed not to use NSAIDs .  Diabetes type 2 with peripheral vascular complications: -Continue with Lantus, and feet monitor CBG  Hypertension:  -Amlodipine given soft blood pressure  Depression with anxiety:  -Noted continue home medications.  Coronary artery disease with stable angina pectoris:  -Continue atorvastatin, RN nitroglycerin, and beta-blockers.  Diabetic peripheral neuropathy:  -Noted continue gabapentin.  Obstructive sleep apnea:  -Continue with CPAP  Thyroidism -Continue with methimazole  AKI -Continue to increase to 1.7 today, this is most likely in the setting of low blood pressure, volume depletion, I will discontinue Lasix, she should receive some volume with PRBC transfusion today    Code Status : Full code  Family Communication  : Daughter at bedside  Disposition Plan  : pending PT consult  Consults  :  ortho, GI  Procedures  : Right transtibial amputation by Dr. Sharol Given 04/15/2018, endoscopy 04/16/2018 by Dr. Henrene Pastor  DVT Prophylaxis  : Heparin GTT , transition to Eliquis tomorrow  Lab Results  Component Value Date   PLT 294 04/16/2018     Antibiotics  :    Anti-infectives (From admission, onward)   Start     Dose/Rate Route Frequency Ordered Stop   04/15/18 1800  vancomycin (VANCOCIN) IVPB 750 mg/150 ml premix     750 mg 150 mL/hr over 60 Minutes Intravenous Every 24 hours 04/14/18 1702     04/15/18 0600  ceFAZolin (ANCEF) IVPB 2g/100 mL premix     2 g 200 mL/hr over 30 Minutes Intravenous On call to O.R. 04/14/18 2141 04/15/18 0919   04/14/18 1800  vancomycin (VANCOCIN) 1,500 mg in sodium chloride 0.9 % 500 mL IVPB     1,500 mg 250 mL/hr over 120 Minutes Intravenous  Once 04/14/18 1702 04/14/18 1900   04/14/18 1700  vancomycin (VANCOCIN) IVPB 1000 mg/200 mL premix  Status:  Discontinued     1,000 mg 200 mL/hr over 60 Minutes Intravenous  Once 04/14/18 1654 04/14/18 1655   04/14/18 1045  clindamycin (CLEOCIN) IVPB 900 mg     900 mg 100 mL/hr over 30 Minutes Intravenous On call to O.R. 04/14/18 1036 04/15/18 0559        Objective:   Vitals:   04/16/18 1052 04/16/18 1138 04/16/18 1140 04/16/18 1145  BP: (!) 96/48 (!) 82/48 (!) 92/35 (!) 106/47  Pulse: 65 64 64 (P) 65  Resp: 16 18 18  (P) 11  Temp:  98 F (36.7 C)    TempSrc:  Oral    SpO2: 92% 97% 99% (P) 92%  Weight:      Height:        Wt Readings from Last 3 Encounters:  04/14/18 72.6 kg  04/13/18 78.2 kg  04/07/18 78.2 kg     Intake/Output Summary (Last 24 hours) at 04/16/2018 1233 Last data filed at 04/16/2018 1128 Gross per 24 hour  Intake 1002.92 ml  Output 0 ml  Net 1002.92 ml     Physical Exam  Awake Alert, Oriented X 3, No new F.N deficits, Normal affect Symmetrical Chest wall movement, Good air movement bilaterally, CTAB RRR,No Gallops,Rubs or new Murmurs, No Parasternal Heave +ve B.Sounds, Abd Soft,  No tenderness, No rebound - guarding or rigidity. No Cyanosis, right lower extremity status post trans-tibial amputation, on wound VAC    Data Review:    CBC Recent Labs  Lab 04/14/18 1106 04/15/18 0252 04/15/18 1902  04/16/18 0057  WBC 8.7 7.1  --  6.8  HGB 8.8* 7.5* 7.9* 7.7*  HCT 29.0* 23.7* 25.1* 24.6*  PLT 367 323  --  294  MCV 87.6 87.1  --  85.7  MCH 26.6 27.6  --  26.8  MCHC 30.3 31.6  --  31.3  RDW 14.6 14.8  --  16.5*    Chemistries  Recent Labs  Lab 04/14/18 1106 04/15/18 0252 04/16/18 0057  NA 130* 132* 131*  K 2.8* 3.7 3.9  CL 92* 96* 98  CO2 23 24 20*  GLUCOSE 197* 181* 132*  BUN 19 26* 31*  CREATININE 1.16* 1.45* 1.79*  CALCIUM 8.2* 7.7* 7.6*   ------------------------------------------------------------------------------------------------------------------ No results for input(s): CHOL, HDL, LDLCALC, TRIG, CHOLHDL, LDLDIRECT in the last 72 hours.  Lab Results  Component Value Date   HGBA1C 8.0 (H) 04/14/2018   ------------------------------------------------------------------------------------------------------------------ No results for input(s): TSH, T4TOTAL, T3FREE, THYROIDAB in the last 72 hours.  Invalid input(s): FREET3 ------------------------------------------------------------------------------------------------------------------ No results for input(s): VITAMINB12, FOLATE, FERRITIN, TIBC, IRON, RETICCTPCT in the last 72 hours.  Coagulation profile Recent Labs  Lab 04/14/18 1106  INR 1.38    No results for input(s): DDIMER in the last 72 hours.  Cardiac Enzymes No results for input(s): CKMB, TROPONINI, MYOGLOBIN in the last 168 hours.  Invalid input(s): CK ------------------------------------------------------------------------------------------------------------------    Component Value Date/Time   BNP 62.6 11/15/2015 0629    Inpatient Medications  Scheduled Meds: . sodium chloride   Intravenous Once  . sodium chloride   Intravenous Once  . atorvastatin  20 mg Oral QPM  . buPROPion  150 mg Oral BID  . docusate sodium  100 mg Oral BID  . fenofibrate  160 mg Oral Daily  . FLUoxetine  20 mg Oral QHS  . gabapentin  900 mg Oral BID  .  insulin glargine  20 Units Subcutaneous QHS  . methimazole  10 mg Oral Daily  . metoprolol succinate  25 mg Oral BID  . pantoprazole  40 mg Oral BID  . [START ON 04/19/2018] pantoprazole  40 mg Intravenous Q12H  . potassium chloride SA  20 mEq Oral Daily  . sotalol  120 mg Oral Q12H   Continuous Infusions: . sodium chloride Stopped (04/16/18 1149)  . diltiazem (CARDIZEM) infusion Stopped (04/15/18 1455)  . lactated ringers Stopped (04/14/18 1900)  . lactated ringers 10 mL/hr at 04/15/18 2300  . methocarbamol (ROBAXIN) IV    . pantoprozole (PROTONIX) infusion 8 mg/hr (04/16/18 1231)  . vancomycin 750 mg (04/15/18 1811)   PRN Meds:.acetaminophen, albuterol, bisacodyl, fentaNYL (SUBLIMAZE) injection, HYDROcodone-acetaminophen, HYDROcodone-acetaminophen, magnesium citrate, methocarbamol **OR** methocarbamol (ROBAXIN) IV, metoCLOPramide **OR** metoCLOPramide (REGLAN) injection, morphine injection, nitroGLYCERIN, ondansetron **OR** ondansetron (ZOFRAN) IV, ondansetron **OR** ondansetron (ZOFRAN) IV, polyethylene glycol  Micro Results Recent Results (from the past 240 hour(s))  WOUND CULTURE     Status: Abnormal   Collection Time: 04/13/18 10:39 AM  Result Value Ref Range Status   MICRO NUMBER: [id number]  Final   SPECIMEN QUALITY: Adequate  Final   SOURCE: WOUND (SITE NOT SPECIFIED)  Final   STATUS: FINAL  Final   GRAM STAIN:   Final    Few Polymorphonuclear leukocytes Many Gram positive cocci in clusters   ISOLATE 1: Staphylococcus aureus (A)  Final    Comment: Heavy growth of Staphylococcus aureus      Susceptibility   Staphylococcus aureus - AEROBIC CULT, GRAM STAIN POSITIVE 1    VANCOMYCIN 1 Sensitive     CIPROFLOXACIN <=0.5 Sensitive     CLINDAMYCIN <=0.25 Sensitive     LEVOFLOXACIN 0.25 Sensitive     ERYTHROMYCIN <=0.25 Sensitive     GENTAMICIN <=0.5 Sensitive     OXACILLIN* 0.5 Sensitive      * Oxacillin-susceptible staphylococci aresusceptible to other  penicillinase-stablepenicillins (e.g. Methicillin, Nafcillin), beta-lactam/beta-lactamase inhibitor combinations, andcephems with staphylococcal indications, includingCefazolin.    TETRACYCLINE <=1 Sensitive     TRIMETH/SULFA* <=10 Sensitive      * Oxacillin-susceptible staphylococci aresusceptible to other penicillinase-stablepenicillins (e.g. Methicillin, Nafcillin), beta-lactam/beta-lactamase inhibitor combinations, andcephems with staphylococcal indications, includingCefazolin.Legend:S = Susceptible  I = IntermediateR = Resistant  NS = Not susceptible* = Not tested  NR = Not reported**NN = See antimicrobic comments  MRSA PCR Screening     Status: None   Collection Time: 04/14/18  2:49 PM  Result Value Ref Range Status   MRSA by PCR NEGATIVE NEGATIVE Final    Comment:        The GeneXpert MRSA Assay (FDA approved for NASAL specimens only), is one component of a comprehensive MRSA colonization surveillance program. It is not intended to diagnose MRSA infection nor to guide or monitor treatment for MRSA infections. Performed at [facility], [place] [address] [place], [place] [address]     Radiology Reports Mr Shoulder Right Wo Contrast  Result Date: 03/24/2018 CLINICAL DATA:  Chronic right shoulder pain. Rotator cuff repair 20 years ago. EXAM: MRI OF THE RIGHT SHOULDER WITHOUT CONTRAST TECHNIQUE: Multiplanar, multisequence MR imaging of the shoulder was performed. No intravenous contrast was administered. COMPARISON:  None. FINDINGS: Despite efforts by the technologist and patient, severe motion artifact is present on today's exam and could not be eliminated. Numerous series were repeated, but were also affected by motion artifact. This reduces exam sensitivity and specificity. Rotator cuff: Full-thickness full width tear of the supraspinatus tendon, the tendon is retracted up to 2.5 cm Full-thickness, full width tear of the infraspinatus tendon retracted 2.0 cm. Suspected small  ossifications in the infraspinatus tendon, image 11/8. Mild subscapularis and teres minor tendinopathy. Muscles:  Mild infraspinatus and teres minor atrophy Biceps long head:  Mild tendinopathy of the intra-articular segment. Acromioclavicular Joint: Moderate spurring. Fluid signal in the joint. Small degenerative subcortical cystic lesions. There is a linear low T1 signal and high T2 signal band in the acromion potentially from a small os acromiale or a healing acromial fracture. Type I acromion. As expected there is a small amount of fluid in the subacromial subdeltoid bursa. Glenohumeral Joint: Mild spurring of the humeral head. No overt joint effusion. Labrum:  Indeterminate due to motion artifact. Bones:  Unremarkable Other: No supplemental non-categorized findings. IMPRESSION: 1. Full-thickness full width tear of the supraspinatus tendon retracted up to 2.5 cm. 2. Full-thickness full width tear of the infraspinatus tendon retracted 2.0 cm. 3. Mild subscapularis and teres minor tendinopathy. 4. Mild infraspinatus and teres minor atrophy. The infraspinatus atrophy could well be due to the rotator cuff rupture. Teres minor atrophy is of less certain cause, but could be due to low-level chronic quadrilateral space syndrome. 5. Moderate degenerative AC joint arthropathy. 6. There is a small band of low T1 and accentuated T2 signal traversing the acromion and best seen for example on image 12/8. This would be an unusual appearance for  os acromiale although could conceivably be an anterior os acromiale. This could also be an acromial healing fracture or stress fracture. 7. Despite efforts by the technologist and patient, motion artifact is present on today's exam and could not be eliminated. This reduces exam sensitivity and specificity. Electronically Signed   By: Van Clines M.D.   On: 03/24/2018 10:54     Phillips Climes M.D on 04/16/2018 at 12:33 PM  Between 7am to 7pm - Pager - [phone]  After  7pm go to [email or web address] - password [facility] -  Office  [phone]

## 2018-04-16 NOTE — Transfer of Care (Signed)
Immediate Anesthesia Transfer of Care Note  Patient: Judith [name]  Procedure(s) Performed: ESOPHAGOGASTRODUODENOSCOPY (EGD) WITH PROPOFOL (N/A ) BIOPSY  Patient Location: Endoscopy Unit  Anesthesia Type:MAC  Level of Consciousness: awake and alert   Airway & Oxygen Therapy: Patient Spontanous Breathing and Patient connected to nasal cannula oxygen  Post-op Assessment: Report given to RN and Post -op Vital signs reviewed and stable  Post vital signs: Reviewed and stable  Last Vitals:  Vitals Value Taken Time  BP    Temp    Pulse    Resp    SpO2      Last Pain:  Vitals:   04/16/18 1052  TempSrc:   PainSc: 0-No pain      Patients Stated Pain Goal: 3 (56/38/93 7342)  Complications: No apparent anesthesia complications

## 2018-04-16 NOTE — Anesthesia Procedure Notes (Signed)
Procedure Name: MAC Performed by: Valda Favia, CRNA Pre-anesthesia Checklist: Patient identified, Suction available, Emergency Drugs available, Patient being monitored and Timeout performed Patient Re-evaluated:Patient Re-evaluated prior to induction Oxygen Delivery Method: Nasal cannula Preoxygenation: Pre-oxygenation with 100% oxygen Induction Type: IV induction Airway Equipment and Method: Bite block Placement Confirmation: positive ETCO2 Dental Injury: Teeth and Oropharynx as per pre-operative assessment

## 2018-04-16 NOTE — Progress Notes (Signed)
[place] for Heparin > okay to resume now s/p endoscopy Indication: atrial fibrillation  Patient Measurements: Height: 5' 3.5" (161.3 cm) Weight: 160 lb (72.6 kg) IBW/kg (Calculated) : 53.55 Heparin Dosing Weight: 70 kg  Vital Signs: Temp: 97.9 F (36.6 C) (12/08 1340) Temp Source: Oral (12/08 1340) BP: 131/83 (12/08 1342) Pulse Rate: 65 (12/08 1345)  Labs: Recent Labs    04/14/18 1106  04/14/18 2253 04/15/18 0252 04/15/18 1902 04/15/18 2248 04/16/18 0057  HGB 8.8*  --   --  7.5* 7.9*  --  7.7*  HCT 29.0*  --   --  23.7* 25.1*  --  24.6*  PLT 367  --   --  323  --   --  294  APTT  --    < > 55* 59*  --  48*  --   LABPROT 16.8*  --   --   --   --   --   --   INR 1.38  --   --   --   --   --   --   HEPARINUNFRC  --    < >  --  1.68*  --  1.10* 1.00*  CREATININE 1.16*  --   --  1.45*  --   --  1.79*   < > = values in this interval not displayed.    Estimated Creatinine Clearance: 30.3 mL/min (A) (by C-G formula based on SCr of 1.79 mg/dL (H)).  Assessment: 67 y.o. female with h/o Afib, Eliquis on hold, IV heparin held for endoscopy today, no active bleeding seen.  Okay to resume anticoagulation per GI, and discussed with Dr. Waldron Labs.  Aptt below goal on 1250 units/hr previously.  Heparin level remains falsely elevated from recent DOAC use.  Goal of Therapy:  Heparin level 0.3-0.7 units/ml aPTT 66-102 seconds Monitor platelets by anticoagulation protocol: Yes   Plan:  Resume heparin at 1400 units/hr. APTT in 8 hours Daily aPTT and heparin level.  Marguerite Olea, [facility] Clinical Pharmacist Phone [phone]  04/16/2018 1:53 PM

## 2018-04-16 NOTE — Progress Notes (Signed)
ANTICOAGULATION CONSULT NOTE   Pharmacy Consult for Heparin Indication: atrial fibrillation  Patient Measurements: Height: 5' 3.5" (161.3 cm) Weight: 160 lb (72.6 kg) IBW/kg (Calculated) : 53.55 Heparin Dosing Weight: 70 kg  Vital Signs: Temp: 98.8 F (37.1 C) (12/07 2300) Temp Source: Oral (12/07 2300) BP: 108/69 (12/08 0000) Pulse Rate: 62 (12/08 0000)  Labs: Recent Labs    04/14/18 1106 04/14/18 1351  04/14/18 2253 04/15/18 0252 04/15/18 1902 04/15/18 2248 04/16/18 0057  HGB 8.8*  --   --   --  7.5* 7.9*  --  7.7*  HCT 29.0*  --   --   --  23.7* 25.1*  --  24.6*  PLT 367  --   --   --  323  --   --  294  APTT  --   --    < > 55* 59*  --  48*  --   LABPROT 16.8*  --   --   --   --   --   --   --   INR 1.38  --   --   --   --   --   --   --   HEPARINUNFRC  --  1.80*  --   --  1.68*  --  1.10*  --   CREATININE 1.16*  --   --   --  1.45*  --   --  1.79*   < > = values in this interval not displayed.    Estimated Creatinine Clearance: 30.3 mL/min (A) (by C-G formula based on SCr of 1.79 mg/dL (H)).  Assessment: 67 y.o. female with h/o Afib, Eliquis on hold, for heparin  Goal of Therapy:  Heparin level 0.3-0.7 units/ml aPTT 66-102 seconds Monitor platelets by anticoagulation protocol: Yes   Plan:  Increase Heparin 1400 units/hr APTT in 8 hours  Phillis Knack, PharmD, BCPS

## 2018-04-16 NOTE — Progress Notes (Signed)
Rehab Admissions Coordinator Note:  Per PT recommendation, Patient was screened by Jhonnie Garner for appropriateness for an Inpatient Acute Rehab Consult.  At this time, we are recommending Inpatient Rehab consult. AC will contact MD to request IP Rehab Consult Order.   Jhonnie Garner 04/16/2018, 10:25 AM  I can be reached at [phone].

## 2018-04-16 NOTE — Op Note (Signed)
[facility] Patient Name: Judith [name] Procedure Date : 04/16/2018 MRN: [id number] Attending MD: Docia Chuck. Henrene Pastor , MD Date of Birth: 1950-09-28 CSN: [id number] Age: 67 Admit Type: Inpatient Procedure:                Upper GI endoscopy with biopsies Indications:              [place] Providers:                Docia Chuck. Henrene Pastor, MD, Cleda Daub, RN, Laverda Sorenson, Technician, Edmonia James, CRNA Referring MD:             [facility] Medicines:                Monitored Anesthesia Care Complications:            No immediate complications. Estimated Blood Loss:     Estimated blood loss: none. Procedure:                Pre-Anesthesia Assessment:                           - Prior to the procedure, a History and Physical                            was performed, and patient medications and                            allergies were reviewed. The patient's tolerance of                            previous anesthesia was also reviewed. The risks                            and benefits of the procedure and the sedation                            options and risks were discussed with the patient.                            All questions were answered, and informed consent                            was obtained. Prior Anticoagulants: The patient has                            taken Eliquis (apixaban), last dose was 5 days                            prior to procedure. ASA Grade Assessment: III - A                            patient with severe systemic disease. After  reviewing the risks and benefits, the patient was                            deemed in satisfactory condition to undergo the                            procedure.                           After obtaining informed consent, the endoscope was                            passed under direct vision. Throughout the                            procedure, the patient's blood  pressure, pulse, and                            oxygen saturations were monitored continuously. The                            [id number]-H190 ([id number]) Olympus Adult EGD was introduced                            through the mouth, and advanced to the second part                            of duodenum. The upper GI endoscopy was                            accomplished without difficulty. The patient                            tolerated the procedure well. Scope In: Scope Out: Findings:      The esophagus revealed very mild esophagitis as manifested by erythema       and slight edema at the mucosal Z line.      The stomach was normal.      The cardia and gastric fundus were normal on retroflexion.      Multiple non-bleeding superficial duodenal ulcers with no stigmata of       bleeding were found in the distal first portion of the duodenum.       Biopsies were taken with a cold forceps for Helicobacter pylori testing       using CLOtest. Impression:               1. Mild esophagitis                           2. Multiple superficial duodenal ulcers as the                            cause for recent GI bleed                           3. No active bleeding currently. No high  risk                            stigmata for rebleed. Recommendation:           1. Recommend pantoprazole 40 mg twice daily for 4                            weeks then 40 mg once daily indefinitely                           2. Resume previous diet                           3. Follow-up CLO biopsy and treat for Helicobacter                            pylori if positive                           4. Avoid unnecessary NSAIDs                           5. Okay to resume anticoagulation from GI                            standpoint if medically important                           We will sign off. Please call for questions or                            problems. Thank you                           . Procedure Code(s):        ---  Professional ---                           [phone], Esophagogastroduodenoscopy, flexible,                            transoral; with biopsy, single or multiple Diagnosis Code(s):        --- Professional ---                           K26.9, Duodenal ulcer, unspecified as acute or                            chronic, without hemorrhage or perforation                           K92.1, Melena (includes Hematochezia) CPT copyright 2018 [employer]. All rights reserved. The codes documented in this report are preliminary and upon coder review may  be revised to meet current compliance requirements. Docia Chuck. Henrene Pastor, MD 04/16/2018 11:36:51 AM This report has been signed electronically. Number of Addenda: 0

## 2018-04-16 NOTE — Anesthesia Preprocedure Evaluation (Signed)
Anesthesia Evaluation  Patient identified by MRN, date of birth, ID band Patient awake    Reviewed: Allergy & Precautions, NPO status , Patient's Chart, lab work & pertinent test results  Airway Mallampati: I  TM Distance: >3 FB Neck ROM: Full    Dental   Pulmonary sleep apnea , former smoker,    Pulmonary exam normal        Cardiovascular hypertension, Pt. on medications + CAD and + Cardiac Stents  Normal cardiovascular exam     Neuro/Psych Anxiety Depression    GI/Hepatic GERD  Medicated and Controlled,  Endo/Other  diabetes, Type 2, Oral Hypoglycemic Agents  Renal/GU      Musculoskeletal   Abdominal   Peds  Hematology   Anesthesia Other Findings   Reproductive/Obstetrics                             Anesthesia Physical Anesthesia Plan  ASA: III  Anesthesia Plan: MAC   Post-op Pain Management:    Induction:   PONV Risk Score and Plan: 2 and Ondansetron and Treatment may vary due to age or medical condition  Airway Management Planned: Simple Face Mask  Additional Equipment:   Intra-op Plan:   Post-operative Plan:   Informed Consent: I have reviewed the patients History and Physical, chart, labs and discussed the procedure including the risks, benefits and alternatives for the proposed anesthesia with the patient or authorized representative who has indicated his/her understanding and acceptance.     Plan Discussed with: CRNA and Surgeon  Anesthesia Plan Comments:         Anesthesia Quick Evaluation

## 2018-04-17 ENCOUNTER — Inpatient Hospital Stay (HOSPITAL_COMMUNITY): Payer: Medicare PPO

## 2018-04-17 ENCOUNTER — Encounter (HOSPITAL_COMMUNITY): Payer: Self-pay | Admitting: Internal Medicine

## 2018-04-17 DIAGNOSIS — J9601 Acute respiratory failure with hypoxia: Secondary | ICD-10-CM

## 2018-04-17 DIAGNOSIS — I480 Paroxysmal atrial fibrillation: Secondary | ICD-10-CM

## 2018-04-17 DIAGNOSIS — G8918 Other acute postprocedural pain: Secondary | ICD-10-CM

## 2018-04-17 DIAGNOSIS — S88111A Complete traumatic amputation at level between knee and ankle, right lower leg, initial encounter: Secondary | ICD-10-CM

## 2018-04-17 LAB — SURGICAL PCR SCREEN
MRSA, PCR: NEGATIVE
Staphylococcus aureus: NEGATIVE

## 2018-04-17 LAB — BASIC METABOLIC PANEL
Anion gap: 10 (ref 5–15)
BUN: 27 mg/dL — ABNORMAL HIGH (ref 8–23)
CO2: 27 mmol/L (ref 22–32)
Calcium: 8.1 mg/dL — ABNORMAL LOW (ref 8.9–10.3)
Chloride: 100 mmol/L (ref 98–111)
Creatinine, Ser: 1.75 mg/dL — ABNORMAL HIGH (ref 0.44–1.00)
GFR calc Af Amer: 35 mL/min — ABNORMAL LOW (ref >60)
GFR calc non Af Amer: 30 mL/min — ABNORMAL LOW (ref >60)
Glucose, Bld: 94 mg/dL (ref 70–99)
Potassium: 4 mmol/L (ref 3.5–5.1)
SODIUM: 137 mmol/L (ref 135–145)

## 2018-04-17 LAB — BPAM RBC
Blood Product Expiration Date: 201912292359
Blood Product Expiration Date: 202001022359
ISSUE DATE / TIME: 201912081335
ISSUE DATE / TIME: 201912071328
UNIT TYPE AND RH: 5100
Unit Type and Rh: 5100

## 2018-04-17 LAB — CLOTEST (H. PYLORI), BIOPSY: Helicobacter screen: NEGATIVE

## 2018-04-17 LAB — TYPE AND SCREEN
ABO/RH(D): O POS
Antibody Screen: NEGATIVE
Unit division: 0
Unit division: 0

## 2018-04-17 LAB — GLUCOSE, CAPILLARY
GLUCOSE-CAPILLARY: 65 mg/dL — AB (ref 70–99)
Glucose-Capillary: 147 mg/dL — ABNORMAL HIGH (ref 70–99)
Glucose-Capillary: 92 mg/dL (ref 70–99)
Glucose-Capillary: 101 mg/dL — ABNORMAL HIGH (ref 70–99)

## 2018-04-17 LAB — CBC
HCT: 31.8 % — ABNORMAL LOW (ref 36.0–46.0)
Hemoglobin: 9.8 g/dL — ABNORMAL LOW (ref 12.0–15.0)
MCH: 26.8 pg (ref 26.0–34.0)
MCHC: 30.8 g/dL (ref 30.0–36.0)
MCV: 86.9 fL (ref 80.0–100.0)
Platelets: 387 10*3/uL (ref 150–400)
RBC: 3.66 MIL/uL — ABNORMAL LOW (ref 3.87–5.11)
RDW: 16.8 % — ABNORMAL HIGH (ref 11.5–15.5)
WBC: 8.8 10*3/uL (ref 4.0–10.5)
nRBC: 0.2 % (ref 0.0–0.2)

## 2018-04-17 LAB — APTT: aPTT: 104 seconds — ABNORMAL HIGH (ref 24–36)

## 2018-04-17 LAB — HEPARIN LEVEL (UNFRACTIONATED): Heparin Unfractionated: 0.86 IU/mL — ABNORMAL HIGH (ref 0.30–0.70)

## 2018-04-17 MED ORDER — APIXABAN 5 MG PO TABS
5.0000 mg | ORAL_TABLET | Freq: Two times a day (BID) | ORAL | Status: DC
  Administered 2018-04-17 – 2018-04-20 (×7): 5 mg via ORAL
  Filled 2018-04-17 (×7): qty 1

## 2018-04-17 MED ORDER — INSULIN GLARGINE 100 UNIT/ML ~~LOC~~ SOLN
14.0000 [IU] | Freq: Every day | SUBCUTANEOUS | Status: DC
  Administered 2018-04-17: 14 [IU] via SUBCUTANEOUS
  Filled 2018-04-17: qty 0.14

## 2018-04-17 MED ORDER — FUROSEMIDE 10 MG/ML IJ SOLN
40.0000 mg | Freq: Every day | INTRAMUSCULAR | Status: DC
  Administered 2018-04-17: 40 mg via INTRAVENOUS
  Filled 2018-04-17: qty 4

## 2018-04-17 NOTE — Progress Notes (Signed)
Patient ID: Judith [name], female   DOB: 1950-11-02, 67 y.o.   MRN: [id number] Postoperative day 2 transtibial amputation.  Patient resting comfortably with CPAP in place.  No drainage in the wound VAC canister.  Anticipate discharge to skilled nursing when stable.

## 2018-04-17 NOTE — Progress Notes (Addendum)
Inpatient Diabetes Program Recommendations  AACE/ADA: New Consensus Statement on Inpatient Glycemic Control (2015)  Target Ranges:  Prepandial:   less than 140 mg/dL      Peak postprandial:   less than 180 mg/dL (1-2 hours)      Critically ill patients:  140 - 180 mg/dL   Lab Results  Component Value Date   GLUCAP 65 (L) 04/17/2018   HGBA1C 8.0 (H) 04/14/2018    Review of Glycemic ControlResults for THANYA, CEGIELSKI (MRN [id number]) as of 04/17/2018 09:41  Ref. Range 04/16/2018 07:54 04/16/2018 12:20 04/16/2018 17:06 04/16/2018 21:06 04/17/2018 08:14  Glucose-Capillary Latest Ref Range: 70 - 99 mg/dL 87 70 150 (H) 118 (H) 65 (L)    Diabetes history: Type 2 DM Outpatient Diabetes medications:  Tresiba 20 units q PM, Metformin 1000 mg bid, Current orders for Inpatient glycemic control:  Lantus 20 units q HS  Inpatient Diabetes Program Recommendations:   Please consider reducing Lantus to 14 units q HS.   Thanks,  Adah Perl, RN, BC-ADM Inpatient Diabetes Coordinator Pager [phone] (8a-5p)

## 2018-04-17 NOTE — Progress Notes (Signed)
Physical Therapy Treatment Patient Details Name: Judith [name] MRN: [id number] DOB: 1950/10/31 Today's Date: 04/17/2018    History of Present Illness Pt is a 67 y.o. F with significant PMH of osteomyelitis of right foot, chronic kidney disease, CAD, diabetes mellitus with peripheral neuropathy, paroxysmal atrial fibrillation, who presents s/p right transtibial amputation.    PT Comments    Patient seen for activity progression. Tolerated sit <> stand trials x3. Very limited overall by UE strength and activity tolerance. Currently requiring moderate physical assist to power up and shows poor ability to initiate hop to mobility techniques. At this time, post acute rehabilitation remains appropriate.    Follow Up Recommendations  CIR     Equipment Recommendations  Wheelchair (measurements PT)    Recommendations for Other Services OT consult;Rehab consult     Precautions / Restrictions Precautions Precautions: Fall Precaution Comments: right BKA Required Braces or Orthoses: Other Brace Other Brace: limb guard Restrictions Weight Bearing Restrictions: Yes RLE Weight Bearing: Non weight bearing    Mobility  Bed Mobility               General bed mobility comments: pt in chair  Transfers Overall transfer level: Needs assistance Equipment used: Rolling walker (2 wheeled) Transfers: Sit to/from Stand Sit to Stand: +2 physical assistance;Mod assist         General transfer comment: cues for hand placement, assist to rise and steady, pt with buckling upon attempt to hop or pivot on L foot  Ambulation/Gait                 Stairs             Wheelchair Mobility    Modified Rankin (Stroke Patients Only)       Balance Overall balance assessment: Needs assistance Sitting-balance support: Feet supported Sitting balance-Leahy Scale: Fair     Standing balance support: Bilateral upper extremity supported Standing balance-Leahy Scale: Poor                               Cognition Arousal/Alertness: Awake/alert Behavior During Therapy: WFL for tasks assessed/performed Overall Cognitive Status: Impaired/Different from baseline Area of Impairment: Memory;Safety/judgement;Attention                   Current Attention Level: Selective Memory: Decreased short-term memory   Safety/Judgement: Decreased awareness of safety;Decreased awareness of deficits     General Comments: pt with memory loss at baseline, has hx of falls out of chair when reaching to pick up item from the floor      Exercises      General Comments        Pertinent Vitals/Pain Pain Assessment: Faces Faces Pain Scale: Hurts even more Pain Location: residual limb Pain Descriptors / Indicators: Grimacing;Guarding Pain Intervention(s): Monitored during session;Repositioned    Home Living Family/patient expects to be discharged to:: Private residence Living Arrangements: Other relatives(sister) Available Help at Discharge: Family;Available PRN/intermittently Type of Home: House Home Access: Ramped entrance   Home Layout: Two level;Laundry or work area in [place]: Environmental consultant - 2 wheels;Wheelchair - Education administrator (comment);Shower seat;Grab bars - toilet;Grab bars - tub/shower;Hand held shower head      Prior Function Level of Independence: Needs assistance  Gait / Transfers Assistance Needed: walking with a RW ADL's / Homemaking Assistance Needed: assisted for IADL, one week prior to admission she still had an aide who assisted with showering  PT Goals (current goals can now be found in the care plan section) Acute Rehab PT Goals Patient Stated Goal: "get a prosthetic." PT Goal Formulation: With patient/family Time For Goal Achievement: 04/30/18 Potential to Achieve Goals: Good Progress towards PT goals: Progressing toward goals    Frequency    Min 3X/week      PT Plan Current plan remains appropriate     Co-evaluation PT/OT/SLP Co-Evaluation/Treatment: Yes Reason for Co-Treatment: For patient/therapist safety PT goals addressed during session: Mobility/safety with mobility OT goals addressed during session: ADL's and self-care      AM-PAC PT "6 Clicks" Mobility   Outcome Measure  Help needed turning from your back to your side while in a flat bed without using bedrails?: None Help needed moving from lying on your back to sitting on the side of a flat bed without using bedrails?: A Lot Help needed moving to and from a bed to a chair (including a wheelchair)?: A Lot Help needed standing up from a chair using your arms (e.g., wheelchair or bedside chair)?: A Lot Help needed to walk in hospital room?: Total Help needed climbing 3-5 steps with a railing? : Total 6 Click Score: 12    End of Session Equipment Utilized During Treatment: Gait belt Activity Tolerance: Patient tolerated treatment well Patient left: in chair;with call bell/phone within reach;with family/visitor present;with chair alarm set Nurse Communication: Mobility status PT Visit Diagnosis: Unsteadiness on feet (R26.81);Other abnormalities of gait and mobility (R26.89)     Time: 6629-4765 PT Time Calculation (min) (ACUTE ONLY): 19 min  Charges:  $Therapeutic Activity: 8-22 mins                     Alben Deeds, PT DPT  Board Certified Neurologic Specialist [place] Pager [phone] Office [phone]    Duncan Dull 04/17/2018, 9:49 AM

## 2018-04-17 NOTE — Progress Notes (Signed)
Patient resting comfortably on home CPAP unit. 6 lpm O2 bleed in.

## 2018-04-17 NOTE — Progress Notes (Signed)
[place] for Heparin > transition back to Eliquis Indication: atrial fibrillation  Patient Measurements: Height: 5' 3.5" (161.3 cm) Weight: 160 lb (72.6 kg) IBW/kg (Calculated) : 53.55 Heparin Dosing Weight: 70 kg  Vital Signs: Temp: 98.7 F (37.1 C) (12/09 0815) Temp Source: Oral (12/09 0815) BP: 138/78 (12/09 0815) Pulse Rate: 125 (12/09 0815)  Labs: Recent Labs    04/14/18 1106  04/15/18 0252 04/15/18 1902 04/15/18 2248 04/16/18 0057 04/16/18 2140 04/17/18 0742 04/17/18 0749  HGB 8.8*  --  7.5* 7.9*  --  7.7*  --  9.8*  --   HCT 29.0*  --  23.7* 25.1*  --  24.6*  --  31.8*  --   PLT 367  --  323  --   --  294  --  387  --   APTT  --    < > 59*  --  48*  --  58*  --  104*  LABPROT 16.8*  --   --   --   --   --   --   --   --   INR 1.38  --   --   --   --   --   --   --   --   HEPARINUNFRC  --    < > 1.68*  --  1.10* 1.00*  --  0.86*  --   CREATININE 1.16*  --  1.45*  --   --  1.79*  --  1.75*  --    < > = values in this interval not displayed.    Estimated Creatinine Clearance: 31 mL/min (A) (by C-G formula based on SCr of 1.75 mg/dL (H)).  . sodium chloride Stopped (04/16/18 1149)  . heparin 1,500 Units/hr (04/17/18 0935)  . lactated ringers Stopped (04/14/18 1900)  . lactated ringers 10 mL/hr at 04/15/18 2300  . methocarbamol (ROBAXIN) IV    . pantoprozole (PROTONIX) infusion 8 mg/hr (04/17/18 0343)     Assessment: Judith [name] with h/o Afib, Eliquis on hold, IV heparin held for endoscopy today, no active bleeding seen.  Okay to resume anticoagulation per GI, and discussed with Dr. Waldron Labs.  Aptt just slightly above goal this AM on IV Heparin.  Heparin level remains falsely elevated from recent DOAC use.  No overt bleeding or complications noted.  Pharmacy asked to stop heparin and resume Eliquis this AM.  Goal of Therapy:  Heparin level 0.3-0.7 units/ml aPTT Judith-102 seconds Monitor platelets by anticoagulation  protocol: Yes   Plan:  D/c IV heparin. Give Eliquis 5 mg BID.  Marguerite Olea, [facility] Clinical Pharmacist Phone [phone]  04/17/2018 10:20 AM

## 2018-04-17 NOTE — Progress Notes (Signed)
PROGRESS NOTE                                                                                                                                                                                                             Patient Demographics:    Judith [name], is a 67 y.o. female, DOB - 02-03-1951, MIW:[id number]  Admit date - 04/14/2018   Admitting Physician [name], MD  Outpatient Primary MD for the patient is Rutherford Guys, MD  LOS - 3   No chief complaint on file.      Brief Narrative   67 y.o. female with medical history significant of osteomyelitis of the right foot, chronic kidney disease, coronary artery disease, diabetes mellitus with peripheral neuropathy, paroxysmal atrial fibrillation, sleep apnea who presented  for surgery on her right foot due to osteomyelitis.  Found to have A. fib with RVR, her surgery was canceled, admitted to [facility]. -Rate was controlled, went for right transtibial amputation by Dr. Sharol Given 04/15/2018, noted to have melena, endorses NSAIDs use 10 days before presentation, endoscopy 04/16/2018 significant for duodenal ulcer, and mild esophagitis, required PRBC transfusion.   Subjective:    Judith [name] today has, No headache, No chest pain, No abdominal pain , reports some dyspnea yesterday, wearing oxygen currently.  Assessment  & Plan :    Principal Problem:   Foot osteomyelitis, right ([place]) Active Problems:   DM (diabetes mellitus), type 2 with peripheral vascular complications (HCC)   HTN (hypertension)   Depression with anxiety   GERD (gastroesophageal reflux disease)   Coronary artery disease with stable angina pectoris (HCC)   Memory loss   Diabetic peripheral neuropathy ([place])   Obstructive sleep apnea   S/P transmetatarsal amputation of foot, right (HCC)   Atrial fibrillation with RVR (HCC)   Duodenal ulcer   Osteomyelitis of the right foot.  - Status post transtibial amputation by Dr.  Sharol Given 04/15/2018, was empirically on IV antibiotics on admission,: Tabetic has been stopped currently - continue with PRN pain meds, PT consulted, commendation for CIR, inpatient consult rehab has been placed..  Paroxysmal atrial fibrillation with rapid ventricular response: - Patient will be admitted to the stepdown unit on a diltiazem drip for management of her rapid atrial fibrillation.  -On heparin drip before surgery, now back on Eliquis -She is followed by EP  as an outpatient, and is on Toprol and sotalol together, her heart rate - - Cardizem drip has been stopped .  Acute blood loss anemia due to upper GI bleed -Hemoglobin 7.5 on admission, she transfused 2 units PRBC, hemoglobin remained stable   Upper GI bleed  -The setting of NSAIDs use, endoscopy 04/16/2018 significant for esophagitis and superficial duodenal ulcer, continue with PPI, patient instructed not to use NSAIDs .  Acute hypoxic respiratory failure -Secondary to volume overload, evident by chest x-ray, as did receive blood, and her diuresis was on hold for worsening renal function, she is on 6 L nasal cannula today, I will start back on diuresis, encouraged to use incentive spirometry.   Diabetes type 2 with peripheral vascular complications: -Had couple of low CBGs, her Lantus has been decreased to 14 units nightly, she has allergy to NovoLog, so not on sliding scale  Hypertension:  -Old amlodipine given soft blood pressure  Depression with anxiety:  -Noted continue home medications.  Coronary artery disease with stable angina pectoris:  -Continue atorvastatin, RN nitroglycerin, and beta-blockers.  Diabetic peripheral neuropathy:  -Noted continue gabapentin.  Obstructive sleep apnea:  -Continue with CPAP  Thyroidism -Continue with methimazole  AKI on CKD stage III -Continue peaked at 1.7, most likely in the setting of hemodynamics with volume depletion and low blood pressure, did hold Lasix, but now she  is developing volume overload, it will be resumed, will monitor renal function closely .   Code Status : Full code  Family Communication  : Daughter at bedside  Disposition Plan  : inpatient rehab consulted  Consults  :  ortho, GI  Procedures  : Right transtibial amputation by Dr. Sharol Given 04/15/2018, endoscopy 04/16/2018 by Dr. Henrene Pastor, 2 units PRBC transfusion  DVT Prophylaxis  : Heparin GTT , transition to Eliquis  Lab Results  Component Value Date   PLT 387 04/17/2018    Antibiotics  :    Anti-infectives (From admission, onward)   Start     Dose/Rate Route Frequency Ordered Stop   04/15/18 1800  vancomycin (VANCOCIN) IVPB 750 mg/150 ml premix  Status:  Discontinued     750 mg 150 mL/hr over 60 Minutes Intravenous Every 24 hours 04/14/18 1702 04/16/18 1236   04/15/18 0600  ceFAZolin (ANCEF) IVPB 2g/100 mL premix     2 g 200 mL/hr over 30 Minutes Intravenous On call to O.R. 04/14/18 2141 04/15/18 0919   04/14/18 1800  vancomycin (VANCOCIN) 1,500 mg in sodium chloride 0.9 % 500 mL IVPB     1,500 mg 250 mL/hr over 120 Minutes Intravenous  Once 04/14/18 1702 04/14/18 1900   04/14/18 1700  vancomycin (VANCOCIN) IVPB 1000 mg/200 mL premix  Status:  Discontinued     1,000 mg 200 mL/hr over 60 Minutes Intravenous  Once 04/14/18 1654 04/14/18 1655   04/14/18 1045  clindamycin (CLEOCIN) IVPB 900 mg     900 mg 100 mL/hr over 30 Minutes Intravenous On call to O.R. 04/14/18 1036 04/15/18 0559        Objective:   Vitals:   04/16/18 2300 04/17/18 0011 04/17/18 0300 04/17/18 0815  BP: 135/70 (!) 112/58 129/65 138/78  Pulse: 66  (!) 55 (!) 125  Resp: 18 10 13  (!) 22  Temp: 97.8 F (36.6 C)  97.6 F (36.4 C) 98.7 F (37.1 C)  TempSrc: Axillary  Axillary Oral  SpO2: (!) 89% 92% 94% 90%  Weight:      Height:  Wt Readings from Last 3 Encounters:  04/14/18 72.6 kg  04/13/18 78.2 kg  04/07/18 78.2 kg     Intake/Output Summary (Last 24 hours) at 04/17/2018 1016 Last data  filed at 04/17/2018 0830 Gross per 24 hour  Intake 1333.47 ml  Output 1775 ml  Net -441.53 ml     Physical Exam  Awake Alert, Oriented X 3, in recliner, eating breakfast, comfortable  symmetrical Chest wall movement, Good air movement bilaterally, bibasilar crackles Regular irregular,No Gallops,Rubs or new Murmurs, No Parasternal Heave +ve B.Sounds, Abd Soft, No tenderness, No rebound - guarding or rigidity. No Cyanosis, Clubbing or edema, No new Rash or bruise   No Cyanosis, right lower extremity status post trans-tibial amputation, on wound VAC    Data Review:    CBC Recent Labs  Lab 04/14/18 1106 04/15/18 0252 04/15/18 1902 04/16/18 0057 04/17/18 0742  WBC 8.7 7.1  --  6.8 8.8  HGB 8.8* 7.5* 7.9* 7.7* 9.8*  HCT 29.0* 23.7* 25.1* 24.6* 31.8*  PLT 367 323  --  294 387  MCV 87.6 87.1  --  85.7 86.9  MCH 26.6 27.6  --  26.8 26.8  MCHC 30.3 31.6  --  31.3 30.8  RDW 14.6 14.8  --  16.5* 16.8*    Chemistries  Recent Labs  Lab 04/14/18 1106 04/15/18 0252 04/16/18 0057 04/17/18 0742  NA 130* 132* 131* 137  K 2.8* 3.7 3.9 4.0  CL 92* 96* 98 100  CO2 23 24 20* 27  GLUCOSE 197* 181* 132* 94  BUN 19 26* 31* 27*  CREATININE 1.16* 1.45* 1.79* 1.75*  CALCIUM 8.2* 7.7* 7.6* 8.1*   ------------------------------------------------------------------------------------------------------------------ No results for input(s): CHOL, HDL, LDLCALC, TRIG, CHOLHDL, LDLDIRECT in the last 72 hours.  Lab Results  Component Value Date   HGBA1C 8.0 (H) 04/14/2018   ------------------------------------------------------------------------------------------------------------------ No results for input(s): TSH, T4TOTAL, T3FREE, THYROIDAB in the last 72 hours.  Invalid input(s): FREET3 ------------------------------------------------------------------------------------------------------------------ No results for input(s): VITAMINB12, FOLATE, FERRITIN, TIBC, IRON, RETICCTPCT in the last  72 hours.  Coagulation profile Recent Labs  Lab 04/14/18 1106  INR 1.38    No results for input(s): DDIMER in the last 72 hours.  Cardiac Enzymes No results for input(s): CKMB, TROPONINI, MYOGLOBIN in the last 168 hours.  Invalid input(s): CK ------------------------------------------------------------------------------------------------------------------    Component Value Date/Time   BNP 62.6 11/15/2015 0629    Inpatient Medications  Scheduled Meds: . sodium chloride   Intravenous Once  . sodium chloride   Intravenous Once  . atorvastatin  20 mg Oral QPM  . buPROPion  150 mg Oral BID  . docusate sodium  100 mg Oral BID  . fenofibrate  160 mg Oral Daily  . FLUoxetine  20 mg Oral QHS  . furosemide  40 mg Intravenous Daily  . gabapentin  900 mg Oral BID  . insulin glargine  20 Units Subcutaneous QHS  . methimazole  10 mg Oral Daily  . metoprolol succinate  25 mg Oral BID  . pantoprazole  40 mg Oral BID  . [START ON 04/19/2018] pantoprazole  40 mg Intravenous Q12H  . potassium chloride SA  20 mEq Oral Daily  . sotalol  120 mg Oral Q12H   Continuous Infusions: . sodium chloride Stopped (04/16/18 1149)  . heparin 1,500 Units/hr (04/17/18 0935)  . lactated ringers Stopped (04/14/18 1900)  . lactated ringers 10 mL/hr at 04/15/18 2300  . methocarbamol (ROBAXIN) IV    . pantoprozole (PROTONIX) infusion 8 mg/hr (04/17/18 0343)  PRN Meds:.acetaminophen, albuterol, bisacodyl, fentaNYL (SUBLIMAZE) injection, HYDROcodone-acetaminophen, HYDROcodone-acetaminophen, magnesium citrate, methocarbamol **OR** methocarbamol (ROBAXIN) IV, metoCLOPramide **OR** metoCLOPramide (REGLAN) injection, morphine injection, nitroGLYCERIN, ondansetron **OR** ondansetron (ZOFRAN) IV, ondansetron **OR** ondansetron (ZOFRAN) IV, polyethylene glycol  Micro Results Recent Results (from the past 240 hour(s))  WOUND CULTURE     Status: Abnormal   Collection Time: 04/13/18 10:39 AM  Result Value Ref  Range Status   MICRO NUMBER: [id number]  Final   SPECIMEN QUALITY: Adequate  Final   SOURCE: WOUND (SITE NOT SPECIFIED)  Final   STATUS: FINAL  Final   GRAM STAIN:   Final    Few Polymorphonuclear leukocytes Many Gram positive cocci in clusters   ISOLATE 1: Staphylococcus aureus (A)  Final    Comment: Heavy growth of Staphylococcus aureus      Susceptibility   Staphylococcus aureus - AEROBIC CULT, GRAM STAIN POSITIVE 1    VANCOMYCIN 1 Sensitive     CIPROFLOXACIN <=0.5 Sensitive     CLINDAMYCIN <=0.25 Sensitive     LEVOFLOXACIN 0.25 Sensitive     ERYTHROMYCIN <=0.25 Sensitive     GENTAMICIN <=0.5 Sensitive     OXACILLIN* 0.5 Sensitive      * Oxacillin-susceptible staphylococci aresusceptible to other penicillinase-stablepenicillins (e.g. Methicillin, Nafcillin), beta-lactam/beta-lactamase inhibitor combinations, andcephems with staphylococcal indications, includingCefazolin.    TETRACYCLINE <=1 Sensitive     TRIMETH/SULFA* <=10 Sensitive      * Oxacillin-susceptible staphylococci aresusceptible to other penicillinase-stablepenicillins (e.g. Methicillin, Nafcillin), beta-lactam/beta-lactamase inhibitor combinations, andcephems with staphylococcal indications, includingCefazolin.Legend:S = Susceptible  I = IntermediateR = Resistant  NS = Not susceptible* = Not tested  NR = Not reported**NN = See antimicrobic comments  MRSA PCR Screening     Status: None   Collection Time: 04/14/18  2:49 PM  Result Value Ref Range Status   MRSA by PCR NEGATIVE NEGATIVE Final    Comment:        The GeneXpert MRSA Assay (FDA approved for NASAL specimens only), is one component of a comprehensive MRSA colonization surveillance program. It is not intended to diagnose MRSA infection nor to guide or monitor treatment for MRSA infections. Performed at [facility], [place] [address] [place], [place] [address]     Radiology Reports Mr Shoulder Right Wo Contrast  Result Date: 03/24/2018 CLINICAL  DATA:  Chronic right shoulder pain. Rotator cuff repair 20 years ago. EXAM: MRI OF THE RIGHT SHOULDER WITHOUT CONTRAST TECHNIQUE: Multiplanar, multisequence MR imaging of the shoulder was performed. No intravenous contrast was administered. COMPARISON:  None. FINDINGS: Despite efforts by the technologist and patient, severe motion artifact is present on today's exam and could not be eliminated. Numerous series were repeated, but were also affected by motion artifact. This reduces exam sensitivity and specificity. Rotator cuff: Full-thickness full width tear of the supraspinatus tendon, the tendon is retracted up to 2.5 cm Full-thickness, full width tear of the infraspinatus tendon retracted 2.0 cm. Suspected small ossifications in the infraspinatus tendon, image 11/8. Mild subscapularis and teres minor tendinopathy. Muscles:  Mild infraspinatus and teres minor atrophy Biceps long head:  Mild tendinopathy of the intra-articular segment. Acromioclavicular Joint: Moderate spurring. Fluid signal in the joint. Small degenerative subcortical cystic lesions. There is a linear low T1 signal and high T2 signal band in the acromion potentially from a small os acromiale or a healing acromial fracture. Type I acromion. As expected there is a small amount of fluid in the subacromial subdeltoid bursa. Glenohumeral Joint: Mild spurring of the humeral head. No overt joint  effusion. Labrum:  Indeterminate due to motion artifact. Bones:  Unremarkable Other: No supplemental non-categorized findings. IMPRESSION: 1. Full-thickness full width tear of the supraspinatus tendon retracted up to 2.5 cm. 2. Full-thickness full width tear of the infraspinatus tendon retracted 2.0 cm. 3. Mild subscapularis and teres minor tendinopathy. 4. Mild infraspinatus and teres minor atrophy. The infraspinatus atrophy could well be due to the rotator cuff rupture. Teres minor atrophy is of less certain cause, but could be due to low-level chronic  quadrilateral space syndrome. 5. Moderate degenerative AC joint arthropathy. 6. There is a small band of low T1 and accentuated T2 signal traversing the acromion and best seen for example on image 12/8. This would be an unusual appearance for os acromiale although could conceivably be an anterior os acromiale. This could also be an acromial healing fracture or stress fracture. 7. Despite efforts by the technologist and patient, motion artifact is present on today's exam and could not be eliminated. This reduces exam sensitivity and specificity. Electronically Signed   By: Van Clines M.D.   On: 03/24/2018 10:54   Dg Chest Port 1 View  Result Date: 04/17/2018 CLINICAL DATA:  Increasing shortness of breath over the past several days. History of COPD, previous MI with stent placement, atrial fibrillation. Former smoker. EXAM: PORTABLE CHEST 1 VIEW COMPARISON:  Portable chest x-ray of January 05, 2018 FINDINGS: The lungs are adequately inflated. The interstitial markings have increased further. The pulmonary vascularity is engorged and the cardiac silhouette enlarged. The trachea is midline. There is no pleural effusion, pneumothorax, or alveolar infiltrate. There is calcification in the wall of the aortic arch. The bony thorax exhibits no acute abnormality. IMPRESSION: Mild to moderate interstitial edema consistent with CHF which has worsened since the previous study. Thoracic aortic atherosclerosis. Electronically Signed   By: David  Martinique M.D.   On: 04/17/2018 09:48     Phillips Climes M.D on 04/17/2018 at 10:16 AM  Between 7am to 7pm - Pager - [phone]  After 7pm go to [email or web address] - password [facility]  [facility] -  Office  [phone]

## 2018-04-17 NOTE — Progress Notes (Signed)
Placed patient on home CPAP unit with 2L O2 bleed in.

## 2018-04-17 NOTE — Progress Notes (Signed)
Inpatient Rehabilitation-Admissions Coordinator    Met with patient at the bedside to discuss team's recommendation for inpatient rehabilitation. Shared booklets, expectations while in CIR, expected length of stay, and anticipated functional level at DC. With permission, [facility] contacted pt's sister to discuss caregiver assist. Per Judith [name], who is her sister and roommate, the pt will have 24/7 A at DC between herself and family. Judith [name] is willing to take PTO (per her report, she has up to 40 days to utilize) and is physically able to assist at DC.   AC will follow for medical readiness and begin insurance authorization for possible admit.   Jhonnie Garner, OTR/L  Rehab Admissions Coordinator  [phone] 04/17/2018 2:35 PM

## 2018-04-17 NOTE — Progress Notes (Signed)
ANTICOAGULATION CONSULT NOTE   Pharmacy Consult for Heparin > okay to resume now s/p endoscopy Indication: atrial fibrillation  Patient Measurements: Height: 5' 3.5" (161.3 cm) Weight: 160 lb (72.6 kg) IBW/kg (Calculated) : 53.55 Heparin Dosing Weight: 70 kg  Vital Signs: Temp: 98.7 F (37.1 C) (12/09 0815) Temp Source: Oral (12/09 0815) BP: 138/78 (12/09 0815) Pulse Rate: 125 (12/09 0815)  Labs: Recent Labs    04/14/18 1106  04/15/18 0252 04/15/18 1902 04/15/18 2248 04/16/18 0057 04/16/18 2140 04/17/18 0742 04/17/18 0749  HGB 8.8*  --  7.5* 7.9*  --  7.7*  --   --   --   HCT 29.0*  --  23.7* 25.1*  --  24.6*  --   --   --   PLT 367  --  323  --   --  294  --   --   --   APTT  --    < > 59*  --  48*  --  58*  --  104*  LABPROT 16.8*  --   --   --   --   --   --   --   --   INR 1.38  --   --   --   --   --   --   --   --   HEPARINUNFRC  --    < > 1.68*  --  1.10* 1.00*  --  0.86*  --   CREATININE 1.16*  --  1.45*  --   --  1.79*  --  1.75*  --    < > = values in this interval not displayed.    Estimated Creatinine Clearance: 31 mL/min (A) (by C-G formula based on SCr of 1.75 mg/dL (H)).  . sodium chloride Stopped (04/16/18 1149)  . heparin 1,550 Units/hr (04/17/18 0006)  . lactated ringers Stopped (04/14/18 1900)  . lactated ringers 10 mL/hr at 04/15/18 2300  . methocarbamol (ROBAXIN) IV    . pantoprozole (PROTONIX) infusion 8 mg/hr (04/17/18 0343)     Assessment: 67 y.o. female with h/o Afib, Eliquis on hold, IV heparin held for endoscopy today, no active bleeding seen.  Okay to resume anticoagulation per GI, and discussed with Dr. Waldron Labs.  Aptt just slightly above goal this AM on IV Heparin.  Heparin level remains falsely elevated from recent DOAC use.  No overt bleeding or complications noted.  Goal of Therapy:  Heparin level 0.3-0.7 units/ml aPTT 66-102 seconds Monitor platelets by anticoagulation protocol: Yes   Plan:  Decrease IV heparin to 1500  units/hr. APTT in 8 hours Daily aPTT and heparin level.  Marguerite Olea, [facility] Clinical Pharmacist Phone [phone]  04/17/2018 9:30 AM

## 2018-04-17 NOTE — Consult Note (Signed)
Physical Medicine and Rehabilitation Consult Reason for Consult:  Decreased functional mobility Referring Physician: [facility]   HPI: Judith [name] is a 67 y.o.right handed female with history of sleep apnea with CPAP,CAD with stenting,diabetes mellitus with peripheral neuropathy,CKDstage III,hypertension, quit smoking 3 years ago,PAF maintained on Eliquis, PVD with history of right toe amputations. Per chart review and patient, patient lives with sister. One level home with ramped entrance. There is a laundry in the basement. Reportedly independent prior to admission using a walker but sometimes needs a wheelchair . Presented 04/14/2018 with ulcer of the right foot with ischemic changes. No change with conservative care and underwent right transtibial amputation 04/15/2018 per Dr. Sharol Given. Wound VAC applied. Hospital course pain management. Gastroenterology consulted Dr. Henrene Pastor for anemia with black tarry stools. Hemoglobin decreased to 7.5 patient was transfused. A recent CT of the abdomen in September 2019 for nausea vomiting unremarkable.Gastric emptying scan 02/14/2018 normal study. Endoscopy completed 04/16/2018 significant for ulcerated esophagitis and superficial ulcer. Advise continue PPI. Patient presently maintained on intravenous heparin and await plan to possibly resume chronic Eliquis. Therapy evaluations completed with recommendations of physical medicine rehabilitation consult.   Review of Systems  Constitutional: Negative for chills and fever.  HENT: Negative for hearing loss.   Eyes: Negative for blurred vision and double vision.  Respiratory: Negative for cough and shortness of breath.   Gastrointestinal: Positive for blood in stool, constipation, nausea and vomiting.       GERD  Genitourinary: Negative for dysuria and hematuria.  Musculoskeletal: Positive for joint pain and myalgias.  Skin: Negative for rash.  Neurological: Positive for tremors, sensory change and headaches.    Psychiatric/Behavioral: Positive for depression.  All other systems reviewed and are negative.  Past Medical History:  Diagnosis Date  . Abnormal EKG 07/31/2013  . Arthritis    "hands" (03/06/2015)  . Asthma   . Carpal tunnel syndrome, bilateral   . Chronic kidney disease   . Complication of anesthesia    slow to wake up. Pt sts she woke up during surgery many years ago.  . Coronary artery disease     2 v CAD with CTO of the RCA and high grade bifurcational LCx/OM stenosis. S/P PCI DES x 2 to the LCx/OM.  Marland Kitchen Diabetic peripheral neuropathy ([place]) "since 1996"  . GERD (gastroesophageal reflux disease)   . Goiter   . Headache    migraines prior to menopause  . History of shingles 06/01/2013  . Hyperlipidemia LDL goal <70 10/13/2015  . Hypertension   . Hyperthyroidism   . Osteomyelitis of foot (HCC)    Right  . PAF (paroxysmal atrial fibrillation) ([place]) 04/29/2015   CHADS2VASC score of 5 now on Apixaban  . Pneumonia ~ 1976  . Sleep apnea    Bipap  . Tremors of nervous system   . Type II diabetes mellitus (HCC)    insulin dependent   Past Surgical History:  Procedure Laterality Date  . ABDOMINAL HYSTERECTOMY  1988   age 25; CERVICAL DYSPLASIA; ovaries intact.   . AMPUTATION Right 01/23/2016   Procedure: Right 3rd Ray Amputation;  Surgeon: [name], MD;  Location: [facility];  Service: Orthopedics;  Laterality: Right;  . AMPUTATION Right 02/13/2016   Procedure: Right Transmetatarsal Amputation;  Surgeon: [name], MD;  Location: [facility];  Service: Orthopedics;  Laterality: Right;  . AMPUTATION Right 04/15/2018   Procedure: AMPUTATION BELOW KNEE;  Surgeon: [name], MD;  Location: [facility];  Service: Orthopedics;  Laterality: Right;  . CARDIAC CATHETERIZATION N/A 02/27/2015   Procedure: Left Heart Cath and Coronary Angiography;  Surgeon: [name], MD; LAD 40%, mCFX 80%, OM 70%, RCA 100% calcified       . CARDIAC CATHETERIZATION N/A 03/06/2015   Procedure: Coronary Stent  Intervention;  Surgeon: [name], MD;  Location: [facility];  Service: Cardiovascular;  Laterality: N/A;  Mid CX 3.50x12 promus DES w/ 0% resdual and Prox OM1 2.50x20 promus DES w/ 20% residual  . CARDIOVERSION    . CARPAL TUNNEL RELEASE Right Nov 2015  . CARPAL TUNNEL RELEASE Right 1992; 05/2014   [name]; [place]  . CESAREAN SECTION  1982; 1984  . FOOT NEUROMA SURGERY Bilateral 2000  . I&D EXTREMITY Left 01/06/2018   Procedure: DEBRIDEMENT ULCER LEFT FOOT;  Surgeon: [name], MD;  Location: [facility];  Service: Orthopedics;  Laterality: Left;  . KNEE ARTHROSCOPY Right ~ 2003   "meniscus repair"  . SHOULDER OPEN ROTATOR CUFF REPAIR Right 1996; 1998   "w/fracture repair"  . THYROID SURGERY  2000   "removed lots of nodules"  . TONSILLECTOMY  1976   Family History  Problem Relation Age of Onset  . Cancer Mother 93       bronchial cancer  . Breast cancer Mother   . Lung cancer Mother   . Hypertension Father   . COPD Father   . Heart disease Father 52       CAD with cardiac stenting  . Heart attack Father   . Parkinson's disease Father   . Allergies Sister   . Breast cancer Maternal Grandmother   . Emphysema Maternal Grandfather   . Leukemia Paternal Grandmother   . Emphysema Paternal Grandfather   . Thyroid disease Neg Hx    Social History:  reports that she quit smoking about 3 years ago. Her smoking use included cigarettes. She smoked 0.00 packs per day for 41.00 years. She has never used smokeless tobacco. She reports that she does not drink alcohol or use drugs. Allergies:  Allergies  Allergen Reactions  . Contrast Media [Iodinated Diagnostic Agents] Hives and Other (See Comments)    Spoke to patient, Iodine allergy is really IV contrast allergy.   [name] [Insulin Aspart] Shortness Of Breath and Other (See Comments)    "breathing problems"  . Propofol Shortness Of Breath    "Breathing problems - asthma attack" Can take with benadryl   . Codeine  Nausea And Vomiting and Other (See Comments)    HIGH DOSES-SEVERE VOMITING  . Iodine Other (See Comments)    MUST HAVE BENADRYL PRIOR TO PROCEDURE AND RIGHT BEFORE TREATMENT TO COUNTERACT REACTION-BLISTERING REACTION DERMATOLOGICAL  . Penicillins Itching, Rash and Other (See Comments)    Has patient had a PCN reaction causing immediate rash, facial/tongue/throat swelling, SOB or lightheadedness with hypotension: no Has patient had a PCN reaction causing severe rash involving mucus membranes or skin necrosis: No Has patient had a PCN reaction that required hospitalization No Has patient had a PCN reaction occurring within the last 10 years: No If all of the above answers are "NO", then may proceed with Cephalosporin use.  CHEST SIZED RASH AND ITCHING   . Ace Inhibitors Cough  . Demerol [Meperidine] Nausea And Vomiting  . Dilaudid [Hydromorphone Hcl] Other (See Comments)    HEADACHE   . Neosporin [Neomycin-Bacitracin Zn-Polymyx] Itching, Rash and Other (See Comments)    MAKES REACTIONS WORSE WHEN USING AS PROPHYLACTIC  . Percocet [  Oxycodone-Acetaminophen] Rash  . Tape Itching and Rash   Medications Prior to Admission  Medication Sig Dispense Refill  . Alcohol Swabs (B-D SINGLE USE SWABS REGULAR) PADS 1 each by Does not apply route 2 (two) times daily. 100 each 2  . amLODipine (NORVASC) 5 MG tablet Take 1 tablet (5 mg total) by mouth daily. 30 tablet 0  . apixaban (ELIQUIS) 5 MG TABS tablet Take 1 tablet (5 mg total) by mouth 2 (two) times daily. 180 tablet 3  . atorvastatin (LIPITOR) 20 MG tablet Take 1 tablet (20 mg total) by mouth daily. (Patient taking differently: Take 20 mg by mouth every evening. ) 90 tablet 3  . Blood Glucose Calibration (TRUE METRIX LEVEL 1) Low SOLN 1 each by In Vitro route as needed. 1 each 0  . Blood Glucose Monitoring Suppl (TRUE METRIX AIR GLUCOSE METER) w/Device KIT 1 each by Does not apply route daily. 1 kit 0  . buPROPion (WELLBUTRIN SR) 150 MG 12 hr  tablet Take 150 mg by mouth 2 (two) times daily.    . fenofibrate (TRICOR) 145 MG tablet Take 1 tablet (145 mg total) by mouth daily. (Patient taking differently: Take 145 mg by mouth every evening. ) 90 tablet 3  . FLUoxetine (PROZAC) 20 MG capsule Take 1 capsule (20 mg total) by mouth at bedtime. 90 capsule 2  . furosemide (LASIX) 20 MG tablet Take 3 tablets (60 mg total) by mouth daily. 273 tablet 3  . gabapentin (NEURONTIN) 300 MG capsule Take 3 capsules (900 mg total) by mouth 2 (two) times daily. TAKE 3 CAPSULES BY MOUTH EVERY  MORNING AND 3 CAPSULES AT BEDTIME (Patient taking differently: Take 900 mg by mouth 2 (two) times daily. ) 540 capsule 0  . glucose blood (TRUE METRIX BLOOD GLUCOSE TEST) test strip Used to check blood sugars 2x daily. 100 each 12  . insulin degludec (TRESIBA FLEXTOUCH) 100 UNIT/ML SOPN FlexTouch Pen Inject 0.15 mLs (15 Units total) into the skin daily. (Patient taking differently: Inject 20 Units into the skin every evening. ) 15 mL 0  . Insulin Pen Needle 32G X 4 MM MISC Use to inject insulin daily 100 each 5  . Insulin Syringe-Needle U-100 (INSULIN SYRINGE .5CC/31GX5/16") 31G X 5/16" 0.5 ML MISC Use to inject insulin 100 each 5  . metFORMIN (GLUCOPHAGE-XR) 500 MG 24 hr tablet Take 4 tablets (2,000 mg total) by mouth daily with breakfast. (Patient taking differently: Take 1,000 mg by mouth 2 (two) times daily. ) 360 tablet 3  . methimazole (TAPAZOLE) 10 MG tablet Take 1 tablet (10 mg total) by mouth daily. 30 tablet 11  . metoprolol succinate (TOPROL-XL) 25 MG 24 hr tablet Take 1 tablet (25 mg total) by mouth 2 (two) times daily. Take with or immediately following a meal. 180 tablet 1  . pantoprazole (PROTONIX) 40 MG tablet Take 1 tablet (40 mg total) by mouth daily. 90 tablet 0  . potassium chloride SA (K-DUR,KLOR-CON) 20 MEQ tablet Take 1 tablet (20 mEq total) by mouth daily.    . sotalol (BETAPACE) 120 MG tablet Take 1 tablet (120 mg total) by mouth every 12 (twelve)  hours. 180 tablet 3  . traMADol (ULTRAM) 50 MG tablet Take 50 mg by mouth every 8 (eight) hours as needed for moderate pain or severe pain.   5  . TRUEPLUS LANCETS 28G MISC 1 each by Does not apply route 2 (two) times daily. 100 each 2  . albuterol (PROVENTIL HFA) 108 (90 Base)  MCG/ACT inhaler Inhale 1-2 puffs into the lungs every 6 (six) hours as needed for wheezing or shortness of breath. 1 Inhaler 0  . albuterol (PROVENTIL) (2.5 MG/3ML) 0.083% nebulizer solution Take 3 mLs (2.5 mg total) by nebulization every 6 (six) hours as needed for wheezing or shortness of breath. 150 mL 0  . nitroGLYCERIN (NITROSTAT) 0.4 MG SL tablet Place 1 tablet (0.4 mg total) under the tongue every 5 (five) minutes as needed for chest pain. 25 tablet 3  . ondansetron (ZOFRAN-ODT) 8 MG disintegrating tablet Take 1 tablet (8 mg total) by mouth every 8 (eight) hours as needed for nausea or vomiting. 20 tablet 1  . triamcinolone cream (KENALOG) 0.1 % Apply 1 application topically 2 (two) times daily. (Patient taking differently: Apply 1 application topically 2 (two) times daily as needed (for skin rash/irritation.). ) 30 g 0    Home: Home Living Family/patient expects to be discharged to:: Private residence Living Arrangements: Other relatives(sister) Available Help at Discharge: Family, Available PRN/intermittently Type of Home: House Home Access: [place]: Two level, Laundry or work area in basement [employer] Shower/Tub: Multimedia programmer: [place]: Environmental consultant - 2 wheels, Wheelchair - manual, Bedside commode, Other (comment)(BSC used in shower)  Functional History: Prior Function Level of Independence: Independent Comments: reports independence with mobility and ADL's within last month  Functional Status:  Mobility: Bed Mobility Overal bed mobility: Needs Assistance Bed Mobility: Supine to Sit Supine to sit: Min assist General bed mobility comments: light min assist  up to elevate trunk with use of bed rail Transfers Overall transfer level: Needs assistance Equipment used: Rolling walker (2 wheeled) Transfers: Sit to/from Stand, [employer] Transfers Sit to Stand: Mod assist, +2 physical assistance Squat pivot transfers: Min assist, +2 safety/equipment General transfer comment: Pt requiring modA + 2 to boost up to stand with increased time and effort. cues for hand positioning. Pt unable to achieve significant foot clearance with attempt to hop in place. Sat back down onto bed and performed low pivot towards left with min assist       ADL:    Cognition: Cognition Overall Cognitive Status: Within Functional Limits for tasks assessed Orientation Level: Oriented X4 Cognition Arousal/Alertness: Awake/alert Behavior During Therapy: WFL for tasks assessed/performed Overall Cognitive Status: Within Functional Limits for tasks assessed General Comments: Pt and pt daughter can be tangential  Blood pressure 129/65, pulse (!) 55, temperature 97.6 F (36.4 C), temperature source Axillary, resp. rate 13, height 5' 3.5" (1.613 m), weight 72.6 kg, SpO2 94 %. Physical Exam  Vitals reviewed. Constitutional: She is oriented to person, place, and time. She appears well-developed.  Obese  HENT:  Head: Normocephalic and atraumatic.  Eyes: EOM are normal. Right eye exhibits no discharge. Left eye exhibits no discharge.  Neck: Normal range of motion. Neck supple. No thyromegaly present.  Cardiovascular: Normal rate and regular rhythm.  Respiratory: Effort normal and breath sounds normal. No respiratory distress.  + [place]  GI: She exhibits distension.  Decreased bowel sounds  Musculoskeletal:  Right BKA  Neurological: She is alert and oriented to person, place, and time.  Motor: Bilateral upper extremities: 4+/5 proximal distal Left lower extremity: 4/5 proximal distal Right lower extremity: Hip flexion 4/5 (pain inhibition) Sensation diminished light touch  left foot  Skin:  Wound VAC with limb guard in place  Psychiatric: She has a normal mood and affect. Her behavior is normal.    Results for orders placed or performed during the hospital  encounter of 04/14/18 (from the past 24 hour(s))  Prepare RBC     Status: None   Collection Time: 04/16/18  7:18 AM  Result Value Ref Range   Order Confirmation      ORDER PROCESSED BY BLOOD BANK Performed at [facility], [place] [address], [place], [place] [address]   Glucose, capillary     Status: None   Collection Time: 04/16/18  7:54 AM  Result Value Ref Range   Glucose-Capillary 87 70 - 99 mg/dL   Comment 1 Notify RN    Comment 2 Document in Chart   Glucose, capillary     Status: None   Collection Time: 04/16/18 12:20 PM  Result Value Ref Range   Glucose-Capillary 70 70 - 99 mg/dL  Glucose, capillary     Status: Abnormal   Collection Time: 04/16/18  5:06 PM  Result Value Ref Range   Glucose-Capillary 150 (H) 70 - 99 mg/dL  Glucose, capillary     Status: Abnormal   Collection Time: 04/16/18  9:06 PM  Result Value Ref Range   Glucose-Capillary 118 (H) 70 - 99 mg/dL  APTT     Status: Abnormal   Collection Time: 04/16/18  9:40 PM  Result Value Ref Range   aPTT 58 (H) 24 - 36 seconds   No results found.  Assessment/Plan: Diagnosis: Left BKA Labs independently reviewed.  Records reviewed and summated above. Clean amputation daily with soap and water Monitor incision site for signs of infection or impending skin breakdown. Staples to remain in place for 3-4 weeks Stump shrinker, for edema control, after DC VAC Scar mobilization massaging to prevent soft tissue adherence Stump protector during therapies Prevent flexion contractures by implementing the following:   Encourage prone lying for 20-30 mins per day BID to avoid hip flexion  Contractures if medically appropriate;  Avoid pillow under knees when patient is lying in bed in order to prevent both  knee and hip flexion  contractures;  Avoid prolonged sitting Post surgical pain control with oral medication Phantom limb pain control with physical modalities including desensitization techniques (gentle self massage to the residual stump,hot packs if sensation intact, [place]) and mirror therapy, TENS. If ineffective, consider pharmacological treatment for neuropathic pain (e.g gabapentin, pregabalin, amytriptalyine, duloxetine).  When using wheelchair, patient should have knee on amputated side fully extended with board under the seat cushion. Avoid injury to contralateral side  1. Does the need for close, 24 hr/day medical supervision in concert with the patient's rehab needs make it unreasonable for this patient to be served in a less intensive setting? Yes  2. Co-Morbidities requiring supervision/potential complications: ABLA (repeat labs, transfuse again to ensure appropriate perfusion for increased activity tolerance), post-op pain management (Biofeedback training with therapies to help reduce reliance on opiate pain medications, monitor pain control during therapies, and sedation at rest and titrate to maximum efficacy to ensure participation and gains in therapies), sleep apnea, CAD, DM with peripheral neuropathy (Monitor in accordance with exercise and adjust meds as necessary), CKD stage III (avoid nephrotoxic meds),HTN (monitor and provide prns in accordance with increased physical exertion and pain), PAF (monitor heart rate with increased activity, transition from heparin GTT to oral anticoagulation when appropriate), PVD  3. Due to bladder management, bowel management, safety, skin/wound care, disease management, pain management and patient education, does the patient require 24 hr/day rehab nursing? Yes 4. Does the patient require coordinated care of a physician, rehab nurse, PT (1-2 hrs/day, 5 days/week) and OT (1-2  hrs/day, 5 days/week) to address physical and functional deficits in the context of the above medical  diagnosis(es)? Yes Addressing deficits in the following areas: balance, endurance, locomotion, strength, transferring, bathing, dressing, toileting and psychosocial support 5. Can the patient actively participate in an intensive therapy program of at least 3 hrs of therapy per day at least 5 days per week? Potentially 6. The potential for patient to make measurable gains while on inpatient rehab is excellent 7. Anticipated functional outcomes upon discharge from inpatient rehab are supervision and min assist  with PT, supervision and min assist with OT, n/a with SLP. 8. Estimated rehab length of stay to reach the above functional goals is: 16-19 days. 9. Anticipated D/C setting: Home 10. Anticipated post D/C treatments: HH therapy and Home excercise program 11. Overall Rehab/Functional Prognosis: good  RECOMMENDATIONS: This patient's condition is appropriate for continued rehabilitative care in the following setting: CIR when medically stable and able to tolerate 3 hours of therapy/day if caregiver support available. Patient has agreed to participate in recommended program. Yes Note that insurance prior authorization may be required for reimbursement for recommended care.  Comment: Rehab Admissions Coordinator to follow up.   I have personally performed a face to face diagnostic evaluation, including, but not limited to relevant history and physical exam findings, of this patient and developed relevant assessment and plan.  Additionally, I have reviewed and concur with the physician assistant's documentation above.   Delice Lesch, MD, ABPMR Lavon Paganini Angiulli, PA-C 04/17/2018

## 2018-04-17 NOTE — Evaluation (Signed)
Occupational Therapy Evaluation Patient Details Name: Judith [name] MRN: [id number] DOB: Dec 28, 1950 Today's Date: 04/17/2018    History of Present Illness Pt is a 67 y.o. F with significant PMH of osteomyelitis of right foot, chronic kidney disease, CAD, diabetes mellitus with peripheral neuropathy, paroxysmal atrial fibrillation, who presents s/p right transtibial amputation.   Clinical Impression   Pt was able to stay by herself as her sister worked and used a walker to ambulate. She was able to perform self care and light meal prep modified independently. Pt presents with impaired memory, generalized weakness, decreased activity tolerance and poor standing balance. She requires +2 assist for standing. Pt will need intensive rehab to return to a modified independent level prior to return home. Will follow acutely.    Follow Up Recommendations  CIR    Equipment Recommendations  None recommended by OT    Recommendations for Other Services       Precautions / Restrictions Precautions Precautions: Fall Precaution Comments: right BKA Required Braces or Orthoses: Other Brace Other Brace: limb guard Restrictions Weight Bearing Restrictions: Yes RLE Weight Bearing: Non weight bearing      Mobility Bed Mobility               General bed mobility comments: pt in chair  Transfers Overall transfer level: Needs assistance Equipment used: Rolling walker (2 wheeled) Transfers: Sit to/from Stand Sit to Stand: +2 physical assistance;Mod assist         General transfer comment: cues for hand placement, assist to rise and steady, pt with buckling upon attempt to hop or pivot on L foot    Balance Overall balance assessment: Needs assistance   Sitting balance-Leahy Scale: Fair       Standing balance-Leahy Scale: Poor                             ADL either performed or assessed with clinical judgement   ADL Overall ADL's : Needs  assistance/impaired Eating/Feeding: Independent;Sitting   Grooming: Wash/dry hands;Wash/dry face;Sitting;Set up   Upper Body Bathing: Sitting;Minimal assistance   Lower Body Bathing: Moderate assistance;Sitting/lateral leans   Upper Body Dressing : Minimal assistance;Sitting   Lower Body Dressing: Sitting/lateral leans;Moderate assistance       Toileting- Clothing Manipulation and Hygiene: Maximal assistance;Sitting/lateral lean       Functional mobility during ADLs: (pt unable to hop in place without buckling)       Vision Baseline Vision/History: Wears glasses Wears Glasses: At all times Patient Visual Report: No change from baseline       Perception     Praxis      Pertinent Vitals/Pain Pain Assessment: Faces Faces Pain Scale: Hurts even more Pain Location: residual limb Pain Descriptors / Indicators: Grimacing;Guarding Pain Intervention(s): Monitored during session;Repositioned     Hand Dominance Right   Extremity/Trunk Assessment Upper Extremity Assessment Upper Extremity Assessment: Generalized weakness   Lower Extremity Assessment Lower Extremity Assessment: Defer to PT evaluation RLE Deficits / Details: s/p BKA. Grossly at least anti gravity throughout       Communication Communication Communication: No difficulties   Cognition Arousal/Alertness: Awake/alert Behavior During Therapy: WFL for tasks assessed/performed Overall Cognitive Status: Impaired/Different from baseline Area of Impairment: Memory;Safety/judgement;Attention                   Current Attention Level: Selective Memory: Decreased short-term memory   Safety/Judgement: Decreased awareness of safety;Decreased awareness of deficits  General Comments: pt with memory loss at baseline, has hx of falls out of chair when reaching to pick up item from the floor   General Comments       Exercises     Shoulder Instructions      Home Living Family/patient expects to be  discharged to:: Private residence Living Arrangements: Other relatives(sister) Available Help at Discharge: Family;Available PRN/intermittently Type of Home: House Home Access: Ramped entrance     Home Layout: Two level;Laundry or work area in [place] Shower/Tub: Occupational psychologist: Caroline: Environmental consultant - 2 wheels;Wheelchair - Education administrator (comment);Shower seat;Grab bars - toilet;Grab bars - tub/shower;Hand held shower head          Prior Functioning/Environment Level of Independence: Needs assistance  Gait / Transfers Assistance Needed: walking with a RW ADL's / Homemaking Assistance Needed: assisted for IADL, one week prior to admission she still had an aide who assisted with showering            OT Problem List: Decreased strength;Decreased activity tolerance;Impaired balance (sitting and/or standing);Decreased knowledge of use of DME or AE;Pain      OT Treatment/Interventions: Self-care/ADL training;DME and/or AE instruction;Balance training;Patient/family education;Therapeutic activities    OT Goals(Current goals can be found in the care plan section) Acute Rehab OT Goals Patient Stated Goal: "get a prosthetic." OT Goal Formulation: With patient Time For Goal Achievement: 05/01/18 Potential to Achieve Goals: Good ADL Goals Pt Will Perform Grooming: with set-up;with supervision;sitting Pt Will Perform Lower Body Bathing: with supervision;with set-up;sitting/lateral leans Pt Will Perform Lower Body Dressing: with supervision;with set-up;sitting/lateral leans Pt Will Transfer to Toilet: with supervision;stand pivot transfer;bedside commode Pt Will Perform Toileting - Clothing Manipulation and hygiene: with supervision;with set-up;sitting/lateral leans  OT Frequency: Min 2X/week   Barriers to D/C:            Co-evaluation PT/OT/SLP Co-Evaluation/Treatment: Yes Reason for Co-Treatment: For patient/therapist safety    OT goals addressed during session: ADL's and self-care      AM-PAC OT "6 Clicks" Daily Activity     Outcome Measure Help from another person eating meals?: None Help from another person taking care of personal grooming?: A Little Help from another person toileting, which includes using toliet, bedpan, or urinal?: A Lot Help from another person bathing (including washing, rinsing, drying)?: A Lot Help from another person to put on and taking off regular upper body clothing?: A Little Help from another person to put on and taking off regular lower body clothing?: A Lot 6 Click Score: 16   End of Session Equipment Utilized During Treatment: Gait belt;Rolling walker;Oxygen Nurse Communication: Mobility status  Activity Tolerance: Patient tolerated treatment well Patient left: in chair;with call bell/phone within reach  OT Visit Diagnosis: Unsteadiness on feet (R26.81);Other abnormalities of gait and mobility (R26.89);Pain;Other symptoms and signs involving cognitive function;Muscle weakness (generalized) (M62.81)                Time: 7867-6720 OT Time Calculation (min): 19 min Charges:  OT General Charges $OT Visit: 1 Visit OT Evaluation $OT Eval Moderate Complexity: 1 Mod  Nestor Lewandowsky, OTR/L Acute Rehabilitation Services Pager: [phone] Office: [phone]  [name] 04/17/2018, 9:32 AM

## 2018-04-18 LAB — GLUCOSE, CAPILLARY
Glucose-Capillary: 57 mg/dL — ABNORMAL LOW (ref 70–99)
Glucose-Capillary: 101 mg/dL — ABNORMAL HIGH (ref 70–99)
Glucose-Capillary: 89 mg/dL (ref 70–99)
Glucose-Capillary: 77 mg/dL (ref 70–99)

## 2018-04-18 LAB — BASIC METABOLIC PANEL
Anion gap: 12 (ref 5–15)
BUN: 30 mg/dL — ABNORMAL HIGH (ref 8–23)
CALCIUM: 8.1 mg/dL — AB (ref 8.9–10.3)
CO2: 23 mmol/L (ref 22–32)
Chloride: 101 mmol/L (ref 98–111)
Creatinine, Ser: 1.61 mg/dL — ABNORMAL HIGH (ref 0.44–1.00)
GFR calc Af Amer: 38 mL/min — ABNORMAL LOW (ref >60)
GFR calc non Af Amer: 33 mL/min — ABNORMAL LOW (ref >60)
Glucose, Bld: 91 mg/dL (ref 70–99)
Potassium: 4.3 mmol/L (ref 3.5–5.1)
Sodium: 136 mmol/L (ref 135–145)

## 2018-04-18 LAB — CBC
HCT: 28.8 % — ABNORMAL LOW (ref 36.0–46.0)
Hemoglobin: 9 g/dL — ABNORMAL LOW (ref 12.0–15.0)
MCH: 27.2 pg (ref 26.0–34.0)
MCHC: 31.3 g/dL (ref 30.0–36.0)
MCV: 87 fL (ref 80.0–100.0)
Platelets: 381 10*3/uL (ref 150–400)
RBC: 3.31 MIL/uL — ABNORMAL LOW (ref 3.87–5.11)
RDW: 16.7 % — ABNORMAL HIGH (ref 11.5–15.5)
WBC: 10.4 10*3/uL (ref 4.0–10.5)
nRBC: 0.3 % — ABNORMAL HIGH (ref 0.0–0.2)

## 2018-04-18 MED ORDER — INSULIN GLARGINE 100 UNIT/ML ~~LOC~~ SOLN
5.0000 [IU] | Freq: Every day | SUBCUTANEOUS | Status: DC
  Administered 2018-04-18: 5 [IU] via SUBCUTANEOUS
  Filled 2018-04-18: qty 0.05

## 2018-04-18 MED ORDER — FUROSEMIDE 10 MG/ML IJ SOLN
40.0000 mg | Freq: Two times a day (BID) | INTRAMUSCULAR | Status: DC
  Administered 2018-04-18 – 2018-04-19 (×3): 40 mg via INTRAVENOUS
  Filled 2018-04-18 (×3): qty 4

## 2018-04-18 NOTE — PMR Pre-admission (Signed)
PMR Admission Coordinator Pre-Admission Assessment  Patient: Judith [name] is an 67 y.o., female MRN: [id number] DOB: [birth year] Height: 5' 3.5" (161.3 cm) Weight: 72.6 kg              Insurance Information HMO:     PPO: Yes     PCP:      IPA:      80/20:      OTHER:  PRIMARY: [employer]      Policy#: [id number]      Subscriber: patient CM Name: Jonna Coup      Phone#: [phone] [id number]     Fax#: [phone] Pre-Cert#: [id number]      Employer:  Josem Kaufmann ([id number]) provided by Jeanette Caprice at [facility] (p: [phone] ext [phone]) on 12/12 for admit to CIR on 12/12. Clinical updates are due in 1 week, by 12/19, to [place]. Benefits:  Phone #: NA     Name: Availity online portal Eff. Date: 06/10/17     Deduct: $500 (met $354.86)      Out of Pocket Max: $6,700 (met $6,700)      Life Max:  CIR: $450/day for days 1-4, $0/day for days 5+      SNF: $0/day for days 1-20, $178/day for days 21-100 (100 day limit) Outpatient: per necessity     Co-Pay: $20-40/visit pending service Home Health: 100%, per necessity      Co-Pay: DME: 80%     Co-Pay: 20% Providers:  SECONDARY: Medicaid [place]      Policy#: [id number]   Coverage Code: MAAQN   Subscriber: Patient CM Name:       Phone#:      Fax#:  Pre-Cert#:       Employer:  Benefits:  Phone #: [phone]     Name: verified online via [employer] on 04/18/18 Eff. Date: verified eligibility on 04/18/18     Deduct:       Out of Pocket Max:       Life Max:  CIR:       SNF:  Outpatient:      Co-Pay:  Home Health:       Co-Pay:  DME:      Co-Pay:   Medicaid Application Date:       Case Manager:  Disability Application Date:       Case Worker:   Emergency Contact Information Contact Information    Name Relation Home Work Mobile   Scopel,Lanita Daughter   [phone]   Cox,[name] Sister   [phone]     Current Medical History  Patient Admitting Diagnosis: Right BKA History of Present Illness: Judith [name] is a 67 year old right-handed  female with history of sleep apnea/CPAP,diastolic congestive heart failure, CAD with stenting, diabetes mellitus with peripheral neuropathy, CKD stage III,hypertension, remote tobacco abuse 3 years ago, PAF maintained on Eliquis, PVD with history of right toe amputations. Per chart review and patient, patient lives with her sister. One level home with ramped entrance. There is a laundry in the basement. Patient reportedly independent prior to admission using a walker but sometimes needed a wheelchair. Her sister can provide assistance as needed. Presented 04/14/2018 with ulcer of the right foot with ischemic changes and no relief with conservative care. 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 Sharol Given. Wound VAC applied. Hospital course pain management. Gastroenterology consulted Dr. Henrene Pastor for anemia with black tarry stools. Hemoglobin decreased to 7.5 she was transfused. A recent CT of the abdomen September 2019 for nausea vomiting unremarkable. Gastric emptying scan 02/14/2018  normal study. Endoscopy completed 04/16/2018 significant for ulcerated esophagitis and superficial ulcer. Advise continue PPI.  Acute hypoxic respiratory failure secondary to volume overload evident by chest x-ray she was diuresed.Chronic Eliquis has been resumed. Therapy evaluations completed with recommendations of physical medicine rehabilitation consult.Patient is to be admitted for a comprehensive rehabilitation program on 04/20/18.      Past Medical History  Past Medical History:  Diagnosis Date  . Abnormal EKG 07/31/2013  . Arthritis    "hands" (03/06/2015)  . Asthma   . Carpal tunnel syndrome, bilateral   . Chronic kidney disease   . Complication of anesthesia    slow to wake up. Pt sts she woke up during surgery many years ago.  . Coronary artery disease     2 v CAD with CTO of the RCA and high grade bifurcational LCx/OM stenosis. S/P PCI DES x 2 to the LCx/OM.  Marland Kitchen Diabetic peripheral neuropathy ([place])  "since 1996"  . GERD (gastroesophageal reflux disease)   . Goiter   . Headache    migraines prior to menopause  . History of shingles 06/01/2013  . Hyperlipidemia LDL goal <70 10/13/2015  . Hypertension   . Hyperthyroidism   . Osteomyelitis of foot (HCC)    Right  . PAF (paroxysmal atrial fibrillation) ([place]) 04/29/2015   CHADS2VASC score of 5 now on Apixaban  . Pneumonia ~ 1976  . Sleep apnea    Bipap  . Tremors of nervous system   . Type II diabetes mellitus (HCC)    insulin dependent    Family History  family history includes Allergies in her sister; Breast cancer in her maternal grandmother and mother; COPD in her father; Cancer (age of onset: 63) in her mother; Emphysema in her maternal grandfather and paternal grandfather; Heart attack in her father; Heart disease (age of onset: 28) in her father; Hypertension in her father; Leukemia in her paternal grandmother; Lung cancer in her mother; Parkinson's disease in her father.  Prior Rehab/Hospitalizations:  Has the patient had major surgery during 100 days prior to admission? Yes  Current Medications   Current Facility-Administered Medications:  .  0.9 %  sodium chloride infusion (Manually program via Guardrails IV Fluids), , Intravenous, Once, Irene Shipper, MD .  0.9 %  sodium chloride infusion (Manually program via Guardrails IV Fluids), , Intravenous, Once, Irene Shipper, MD .  0.9 %  sodium chloride infusion, , Intravenous, Continuous, Irene Shipper, MD, Stopped at 04/16/18 1149 .  acetaminophen (TYLENOL) tablet 325-650 mg, 325-650 mg, Oral, Q6H PRN, Irene Shipper, MD .  albuterol (PROVENTIL) (2.5 MG/3ML) 0.083% nebulizer solution 2.5 mg, 2.5 mg, Nebulization, Q6H PRN, Irene Shipper, MD, 2.5 mg at 04/16/18 1402 .  apixaban (ELIQUIS) tablet 5 mg, 5 mg, Oral, BID, Carney, Gay Filler, RPH, 5 mg at 04/20/18 0957 .  atorvastatin (LIPITOR) tablet 20 mg, 20 mg, Oral, QPM, Irene Shipper, MD, 20 mg at 04/19/18 1618 .  bisacodyl  (DULCOLAX) suppository 10 mg, 10 mg, Rectal, Daily PRN, Irene Shipper, MD, 10 mg at 04/18/18 2043 .  buPROPion [facility] SR) 12 hr tablet 150 mg, 150 mg, Oral, BID, Irene Shipper, MD, 150 mg at 04/20/18 0958 .  docusate sodium (COLACE) capsule 100 mg, 100 mg, Oral, BID, Irene Shipper, MD, 100 mg at 04/20/18 0957 .  fenofibrate tablet 160 mg, 160 mg, Oral, Daily, Irene Shipper, MD, 160 mg at 04/20/18 0957 .  fentaNYL (SUBLIMAZE) injection 50 mcg, 50 mcg,  Intravenous, Q4H PRN, Irene Shipper, MD, 50 mcg at 04/16/18 1016 .  FLUoxetine (PROZAC) capsule 20 mg, 20 mg, Oral, QHS, Irene Shipper, MD, 20 mg at 04/19/18 2155 .  furosemide (LASIX) injection 40 mg, 40 mg, Intravenous, Q8H, Amin, Ankit Chirag, MD, 40 mg at 04/19/18 2154 .  gabapentin (NEURONTIN) capsule 900 mg, 900 mg, Oral, BID, Irene Shipper, MD, 900 mg at 04/20/18 0957 .  HYDROcodone-acetaminophen (NORCO) 7.5-325 MG per tablet 1-2 tablet, 1-2 tablet, Oral, Q4H PRN, Irene Shipper, MD, 2 tablet at 04/18/18 0102 .  HYDROcodone-acetaminophen (NORCO/VICODIN) 5-325 MG per tablet 1-2 tablet, 1-2 tablet, Oral, Q4H PRN, Irene Shipper, MD, 2 tablet at 04/20/18 [phone]  lactated ringers infusion, , Intravenous, Continuous, Irene Shipper, MD, Stopped at 04/14/18 1900 .  lactated ringers infusion, , Intravenous, Continuous, Irene Shipper, MD, Last Rate: 10 mL/hr at 04/15/18 2300 .  magnesium citrate solution 1 Bottle, 1 Bottle, Oral, Once PRN, Irene Shipper, MD .  magnesium oxide (MAG-OX) tablet 800 mg, 800 mg, Oral, BID, Amin, Ankit Chirag, MD, 800 mg at 04/20/18 0957 .  MEDLINE mouth rinse, 15 mL, Mouth Rinse, BID, Elgergawy, Silver Huguenin, MD, 15 mL at 04/20/18 1003 .  methimazole (TAPAZOLE) tablet 10 mg, 10 mg, Oral, Daily, Irene Shipper, MD, 10 mg at 04/20/18 0958 .  methocarbamol (ROBAXIN) tablet 500 mg, 500 mg, Oral, Q6H PRN, 500 mg at 04/20/18 0956 **OR** methocarbamol (ROBAXIN) 500 mg in dextrose 5 % 50 mL IVPB, 500 mg, Intravenous, Q6H PRN, Irene Shipper, MD .  metoCLOPramide (REGLAN) tablet 5-10 mg, 5-10 mg, Oral, Q8H PRN **OR** metoCLOPramide (REGLAN) injection 5-10 mg, 5-10 mg, Intravenous, Q8H PRN, Irene Shipper, MD .  metoprolol succinate (TOPROL-XL) 24 hr tablet 25 mg, 25 mg, Oral, BID, Irene Shipper, MD, 25 mg at 04/20/18 0957 .  morphine 2 MG/ML injection 0.5-1 mg, 0.5-1 mg, Intravenous, Q2H PRN, Irene Shipper, MD .  nitroGLYCERIN (NITROSTAT) SL tablet 0.4 mg, 0.4 mg, Sublingual, Q5 min PRN, Irene Shipper, MD .  ondansetron [facility]) tablet 4 mg, 4 mg, Oral, Q6H PRN **OR** ondansetron (ZOFRAN) injection 4 mg, 4 mg, Intravenous, Q6H PRN, Irene Shipper, MD .  pantoprazole (PROTONIX) EC tablet 40 mg, 40 mg, Oral, BID, Irene Shipper, MD, 40 mg at 04/20/18 0958 .  polyethylene glycol (MIRALAX / GLYCOLAX) packet 17 g, 17 g, Oral, Daily PRN, Irene Shipper, MD, 17 g at 04/18/18 1002 .  potassium chloride SA (K-DUR,KLOR-CON) CR tablet 20 mEq, 20 mEq, Oral, Daily, Irene Shipper, MD, 20 mEq at 04/20/18 0958 .  sotalol (BETAPACE) tablet 120 mg, 120 mg, Oral, Q12H, Irene Shipper, MD, 120 mg at 04/20/18 9628  Patients Current Diet:  Diet Order            Diet regular Room service appropriate? 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 None of the above  Prior Functional Level Prior Function Level of Independence: Needs assistance Gait / Transfers Assistance Needed: walking with a RW ADL's / Homemaking Assistance Needed: assisted for IADL, one week prior to admission she still had an aide who assisted with showering Comments: reports independence with mobility and ADL's within last month   Self Care: Did the patient need help bathing, dressing, using the toilet or eating?  Independent  Indoor Mobility: Did the patient need assistance with walking from room to room (with or without device)? Independent  Stairs: Did the patient need assistance with internal or external stairs (with or without device)? Unknown, reports she never needed to navigate  Functional Cognition: Did the patient need help planning regular tasks such as shopping or remembering to take medications? Needed some help  Current Functional Level Cognition  Overall Cognitive Status: Impaired/Different from baseline Current Attention Level: Selective Orientation Level: Oriented X4 Safety/Judgement: Decreased awareness of safety, Decreased awareness of deficits General Comments: pt with memory loss at baseline, has hx of falls out of chair when reaching to pick up item from the floor    Extremity Assessment (includes Sensation/Coordination)  Upper Extremity Assessment: Generalized weakness  Lower Extremity Assessment: Defer to PT evaluation RLE Deficits / Details: s/p BKA. Grossly at least anti gravity throughout LLE Deficits / Details: strength 5/5    ADLs  Overall ADL's : Needs assistance/impaired Eating/Feeding: Independent, Sitting Grooming: Wash/dry hands, Wash/dry face, Sitting, Set up Upper Body Bathing: Sitting, Minimal assistance Lower Body Bathing: Moderate assistance, Sitting/lateral leans Upper Body Dressing : Minimal assistance, Sitting Lower Body Dressing:  Sitting/lateral leans, Moderate assistance Toileting- Clothing Manipulation and Hygiene: Maximal assistance, Sitting/lateral lean Functional mobility during ADLs: (pt unable to hop in place without buckling)    Mobility  Overal bed mobility: Needs Assistance Bed Mobility: Supine to Sit Supine to sit: Min assist General bed mobility comments: min assist to elevate trunk to uprigh tat EOB    Transfers  Overall transfer level: Needs assistance Equipment used: Rolling walker (2 wheeled) Transfers: Squat Pivot Transfers Sit to Stand: +2 physical assistance, Mod assist Squat pivot transfers: Min assist General transfer comment: min assist for power up and positioning, perfomed x3 during session with education for chair position and UE placement. patient using countdown method and rocker technique to build moment prior to transition    Ambulation / Gait / Stairs / Office manager / Balance Balance Overall balance assessment: Needs assistance Sitting-balance support: Feet supported Sitting balance-Leahy Scale: Fair Standing balance support: Bilateral upper extremity supported Standing balance-Leahy Scale: Poor    Special needs/care consideration BiPAP/CPAP: yes CPAP CPM: no Continuous Drip IV: methocarbamol (ROBAXIN), lactated ringers infusion, 0.9% sodium chloride infusion.  Dialysis: no        Days: no Life Vest: no Oxygen: yes, 2L [place] Special Bed: no Trach Size: no Wound Vac (area): yes      Location: RLE at surgical incision site.  Skin: right lower leg surgical incision                       Bowel mgmt:continent, last BM 04/18/18 Bladder mgmt: continent Diabetic mgmt: yes     Previous Home Environment Living Arrangements: Other relatives(sister) Available Help at Discharge: Family, Available PRN/intermittently Type of  Home: House Home Layout: Two level, Laundry or work area in [place]: Ramped entrance [employer] Shower/Tub: Clinical cytogeneticist: [place]: No  Discharge Living Setting Plans for Discharge Living Setting: Patient's home, Lives with (comment)(lives with sister Tye [place])) Type of Home at Discharge: House Discharge [place]: One level Discharge Home Access: [place] entrance Discharge Bathroom Shower/Tub: Walk-in shower(handicapped; rails) Discharge Bathroom Toilet: Standard(with rails) Discharge Bathroom Accessibility: Yes How Accessible: Accessible via walker, Accessible via wheelchair(depending on which bathroom is used) Does the patient have any problems obtaining your medications?: No  Social/Family/Support Systems Patient Roles: Parent, Other (Comment)(worked at times; has grown Naval architect; lives with sister) Sport and exercise psychologist Information: sister: Rulon Eisenmenger [phone]; daughter Bain): [phone] Anticipated Caregiver: [name]-has 12/20-1/3 off; after 1st of year able to take 40 more PTO days as needed Anticipated Caregiver's Contact Information: see above Ability/Limitations of Caregiver: MIn A Caregiver Availability: Intermittent(as needed) Discharge Plan Discussed with Primary Caregiver: Yes Is Caregiver In Agreement with Plan?: Yes Does Caregiver/Family have Issues with Lodging/Transportation while Pt is in Rehab?: No   Goals/Additional Needs Patient/Family Goal for Rehab: PT/OT: Supervision/Min A; SLP: NA Expected length of stay: 16-19 days Cultural Considerations: [facility] Dietary Needs: regular diet, thin liquids Equipment Needs: TBD Pt/Family Agrees to Admission and willing to participate: Yes Program Orientation Provided & Reviewed with Pt/Caregiver Including Roles  & Responsibilities: Yes(with pt and [name])  Barriers to Discharge: Decreased caregiver support, New oxygen, Medical stability, Weight bearing restrictions  Barriers to Discharge Comments: new O2 needs in hospital; sister does work full time with hope to return to full time shortly after pt completes  rehab   Decrease burden of Care through IP rehab admission: NA   Possible need for SNF placement upon discharge:Not anticipated; pt has good prognosis for further progress, has good social support available to her at home through her sister, and has appropriate environmental modifications in her home to accommodate current status.    Patient Condition: This patient's medical and functional status has changed since the consult dated 04/17/18 in which the Rehabilitation Physician determined and documented that the patient was potentially appropriate for intensive rehabilitative care in an inpatient rehabilitation facility. Issues have been addressed and update has been discussed with Dr. Letta Pate  and patient now appropriate for inpatient rehabilitation. Pt is now medically stable and demonstrates ability to tolerate 3 hours of therapy daily. Pt has stable caregiver support at DC with pt's sister able to take up to 40 days paid time off to assist as needed. Will admit to inpatient rehab today.   Preadmission Screen Completed By:  Jhonnie Garner, 04/20/2018 1:32 PM ______________________________________________________________________   Discussed status with Dr. Letta Pate on 04/20/18 at 1:32PM and received telephone approval for admission today.  Admission Coordinator:  Jhonnie Garner, time 1:32PM/Date 04/20/18.

## 2018-04-18 NOTE — Progress Notes (Signed)
This note also relates to the following rows which could not be included: Dose (mg/hr) Pantoprazole - Cannot attach notes to extension rows Rate Pantoprazole  - Cannot attach notes to extension rows Concentration Pantoprazole - Cannot attach notes to extension rows

## 2018-04-18 NOTE — Progress Notes (Signed)
Inpatient Rehabilitation-Admissions Coordinator   [facility] has received notification that pt has been denied request for CIR but was given option for peer to peer. Per Dr. Waldron Labs, he will attempt peer to peer to overturn decision. Will follow up with final decision.   Jhonnie Garner, OTR/L  Rehab Admissions Coordinator  [phone] 04/18/2018 1:24 PM

## 2018-04-18 NOTE — Progress Notes (Signed)
PROGRESS NOTE                                                                                                                                                                                                             Patient Demographics:    Judith [name], is a 67 y.o. female, DOB - [birth year], WFU:[id number]  Admit date - 04/14/2018   Admitting Physician [name], MD  Outpatient Primary MD for the patient is Rutherford Guys, MD  LOS - 4   No chief complaint on file.      Brief Narrative   67 y.o. female with medical history significant of osteomyelitis of the right foot, chronic kidney disease, coronary artery disease, diabetes mellitus with peripheral neuropathy, paroxysmal atrial fibrillation, sleep apnea who presented  for surgery on her right foot due to osteomyelitis.  Found to have A. fib with RVR, her surgery was canceled, admitted to [facility]. -Rate was controlled, went for right transtibial amputation by Dr. Sharol Given 04/15/2018, noted to have melena, endorses NSAIDs use 10 days before presentation, endoscopy 04/16/2018 significant for duodenal ulcer, and mild esophagitis, required PRBC transfusion, she developed mild AKI, diuresis has been held, she did develop some volume overload requiring the diuresis and new oxygen requirement.   Subjective:    [name] today has, No headache, No chest pain, No abdominal pain , reports dyspnea, mildly improved, still requiring oxygen .   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  - Status post transtibial amputation by Dr. Sharol Given 04/15/2018, was empirically on IV antibiotics on admission,: Antibiotics has been stopped currently - continue with PRN pain meds, PT consulted, mentation for CIR, awaiting insurance  approval -Per orthopedic, patient should be discharged with portable Praveena wound VAC pump and transferred to the hospital VAC pump..  Paroxysmal atrial fibrillation with rapid ventricular response: - Patient will be admitted to the stepdown unit on a diltiazem drip for management of her rapid atrial fibrillation.  -On heparin drip before surgery, now back on Eliquis -She is followed by EP as an outpatient, and is on Toprol and sotalol together, her heart rate controlled,-  Cardizem drip has been stopped .  Acute blood loss anemia due to upper GI bleed -Hemoglobin 7.5 on admission, she transfused 2 units PRBC, hemoglobin remained stable   Upper GI bleed  -The setting of NSAIDs use, endoscopy 04/16/2018 significant for esophagitis and superficial duodenal ulcer, continue with PPI, patient instructed not to use NSAIDs .  Acute hypoxic respiratory failure/Acute on chronic diastolic CHF -Secondary to volume overload, evident by chest x-ray, as did receive blood, and her diuresis was on hold for worsening renal function, will increase IV Lasix to 40 mg twice daily, continue with daily weight and strict ins and outs, wean oxygen as tolerated. -2D echo last August with a preserved EF and grade 2 diastolic dysfunction.   Diabetes type 2 with peripheral vascular complications: -Glycemic this morning, will decrease her Lantus further to 5 units daily,  -she has allergy to NovoLog, so not on sliding  scale  Hypertension:  -Hold amlodipine given soft blood pressure  Depression with anxiety:  -Noted continue home medications.  Coronary artery disease with stable angina pectoris:  -Continue atorvastatin, RN nitroglycerin, and beta-blockers.  Diabetic peripheral neuropathy:  -Noted continue gabapentin.  Obstructive sleep apnea:  -Continue with CPAP  Thyroidism -Continue with methimazole  AKI on CKD stage III -Continue peaked at 1.7, most likely in the setting of hemodynamics with volume depletion and low blood pressure, did hold Lasix, but now she is developing volume overload, it will be resumed, will monitor renal function closely .   Code Status : Full code  Family Communication  : none at bedside  Disposition Plan  : inpatient rehab consulted  Consults  :  ortho, GI  Procedures  : Right transtibial amputation by Dr. Sharol Given 04/15/2018, endoscopy 04/16/2018 by Dr. Henrene Pastor, 2 units PRBC transfusion  DVT Prophylaxis  : Heparin GTT , transition to Eliquis  Lab Results  Component Value Date   PLT 381 04/18/2018    Antibiotics  :    Anti-infectives (From admission, onward)   Start     Dose/Rate Route Frequency Ordered Stop   04/15/18 1800  vancomycin (VANCOCIN) IVPB 750 mg/150 ml premix  Status:  Discontinued     750 mg 150 mL/hr over 60 Minutes Intravenous Every 24 hours 04/14/18 1702 04/16/18 1236   04/15/18 0600  ceFAZolin (ANCEF) IVPB 2g/100 mL premix     2 g 200 mL/hr over 30 Minutes Intravenous On call to O.R. 04/14/18 2141 04/15/18 0919   04/14/18 1800  vancomycin (VANCOCIN) 1,500 mg in sodium chloride 0.9 % 500 mL IVPB     1,500 mg 250 mL/hr over 120 Minutes Intravenous  Once 04/14/18 1702 04/14/18 1900   04/14/18 1700  vancomycin (VANCOCIN) IVPB 1000 mg/200 mL premix  Status:  Discontinued     1,000 mg 200 mL/hr over 60 Minutes Intravenous  Once 04/14/18 1654 04/14/18 1655   04/14/18 1045  clindamycin (CLEOCIN) IVPB 900 mg     900 mg 100 mL/hr over 30  Minutes Intravenous On call to O.R. 04/14/18 1036 04/15/18 0559  Objective:   Vitals:   04/17/18 2144 04/18/18 0811 04/18/18 1001 04/18/18 1152  BP: (!) 130/104 (!) 146/83  116/64  Pulse: 61 (!) 56 61 (!) 58  Resp:  (!) 22  15  Temp:  97.6 F (36.4 C)  99.5 F (37.5 C)  TempSrc:  Oral  Oral  SpO2:  98%  (!) 89%  Weight:      Height:        Wt Readings from Last 3 Encounters:  04/14/18 72.6 kg  04/13/18 78.2 kg  04/07/18 78.2 kg     Intake/Output Summary (Last 24 hours) at 04/18/2018 1209 Last data filed at 04/18/2018 1200 Gross per 24 hour  Intake 1226.67 ml  Output 500 ml  Net 726.67 ml     Physical Exam  Awake Alert, Oriented X 3, No new F.N deficits, Normal affect Symmetrical Chest wall movement, Good air movement bilaterally, bibasilar crackles RRR,No Gallops,Rubs or new Murmurs, No Parasternal Heave +ve B.Sounds, Abd Soft, No tenderness, No rebound - guarding or rigidity. No Cyanosis, right lower extremity status post trans-tibial amputation, on wound VAC    Data Review:    CBC Recent Labs  Lab 04/14/18 1106 04/15/18 0252 04/15/18 1902 04/16/18 0057 04/17/18 0742 04/18/18 0230  WBC 8.7 7.1  --  6.8 8.8 10.4  HGB 8.8* 7.5* 7.9* 7.7* 9.8* 9.0*  HCT 29.0* 23.7* 25.1* 24.6* 31.8* 28.8*  PLT 367 323  --  294 387 381  MCV 87.6 87.1  --  85.7 86.9 87.0  MCH 26.6 27.6  --  26.8 26.8 27.2  MCHC 30.3 31.6  --  31.3 30.8 31.3  RDW 14.6 14.8  --  16.5* 16.8* 16.7*    Chemistries  Recent Labs  Lab 04/14/18 1106 04/15/18 0252 04/16/18 0057 04/17/18 0742 04/18/18 0230  NA 130* 132* 131* 137 136  K 2.8* 3.7 3.9 4.0 4.3  CL 92* 96* 98 100 101  CO2 23 24 20* 27 23  GLUCOSE 197* 181* 132* 94 91  BUN 19 26* 31* 27* 30*  CREATININE 1.16* 1.45* 1.79* 1.75* 1.61*  CALCIUM 8.2* 7.7* 7.6* 8.1* 8.1*   ------------------------------------------------------------------------------------------------------------------ No results for input(s): CHOL,  HDL, LDLCALC, TRIG, CHOLHDL, LDLDIRECT in the last 72 hours.  Lab Results  Component Value Date   HGBA1C 8.0 (H) 04/14/2018   ------------------------------------------------------------------------------------------------------------------ No results for input(s): TSH, T4TOTAL, T3FREE, THYROIDAB in the last 72 hours.  Invalid input(s): FREET3 ------------------------------------------------------------------------------------------------------------------ No results for input(s): VITAMINB12, FOLATE, FERRITIN, TIBC, IRON, RETICCTPCT in the last 72 hours.  Coagulation profile Recent Labs  Lab 04/14/18 1106  INR 1.38    No results for input(s): DDIMER in the last 72 hours.  Cardiac Enzymes No results for input(s): CKMB, TROPONINI, MYOGLOBIN in the last 168 hours.  Invalid input(s): CK ------------------------------------------------------------------------------------------------------------------    Component Value Date/Time   BNP 62.6 11/15/2015 0629    Inpatient Medications  Scheduled Meds: . sodium chloride   Intravenous Once  . sodium chloride   Intravenous Once  . apixaban  5 mg Oral BID  . atorvastatin  20 mg Oral QPM  . buPROPion  150 mg Oral BID  . docusate sodium  100 mg Oral BID  . fenofibrate  160 mg Oral Daily  . FLUoxetine  20 mg Oral QHS  . furosemide  40 mg Intravenous BID  . gabapentin  900 mg Oral BID  . insulin glargine  14 Units Subcutaneous QHS  . methimazole  10 mg Oral Daily  . metoprolol succinate  25 mg Oral BID  . pantoprazole  40 mg Oral BID  . [START ON 04/19/2018] pantoprazole  40 mg Intravenous Q12H  . potassium chloride SA  20 mEq Oral Daily  . sotalol  120 mg Oral Q12H   Continuous Infusions: . sodium chloride Stopped (04/16/18 1149)  . lactated ringers Stopped (04/14/18 1900)  . lactated ringers 10 mL/hr at 04/15/18 2300  . methocarbamol (ROBAXIN) IV     PRN Meds:.acetaminophen, albuterol, bisacodyl, fentaNYL (SUBLIMAZE)  injection, HYDROcodone-acetaminophen, HYDROcodone-acetaminophen, magnesium citrate, methocarbamol **OR** methocarbamol (ROBAXIN) IV, metoCLOPramide **OR** metoCLOPramide (REGLAN) injection, morphine injection, nitroGLYCERIN, ondansetron **OR** ondansetron (ZOFRAN) IV, ondansetron **OR** ondansetron (ZOFRAN) IV, polyethylene glycol  Micro Results Recent Results (from the past 240 hour(s))  WOUND CULTURE     Status: Abnormal   Collection Time: 04/13/18 10:39 AM  Result Value Ref Range Status   MICRO NUMBER: [id number]  Final   SPECIMEN QUALITY: Adequate  Final   SOURCE: WOUND (SITE NOT SPECIFIED)  Final   STATUS: FINAL  Final   GRAM STAIN:   Final    Few Polymorphonuclear leukocytes Many Gram positive cocci in clusters   ISOLATE 1: Staphylococcus aureus (A)  Final    Comment: Heavy growth of Staphylococcus aureus      Susceptibility   Staphylococcus aureus - AEROBIC CULT, GRAM STAIN POSITIVE 1    VANCOMYCIN 1 Sensitive     CIPROFLOXACIN <=0.5 Sensitive     CLINDAMYCIN <=0.25 Sensitive     LEVOFLOXACIN 0.25 Sensitive     ERYTHROMYCIN <=0.25 Sensitive     GENTAMICIN <=0.5 Sensitive     OXACILLIN* 0.5 Sensitive      * Oxacillin-susceptible staphylococci aresusceptible to other penicillinase-stablepenicillins (e.g. Methicillin, Nafcillin), beta-lactam/beta-lactamase inhibitor combinations, andcephems with staphylococcal indications, includingCefazolin.    TETRACYCLINE <=1 Sensitive     TRIMETH/SULFA* <=10 Sensitive      * Oxacillin-susceptible staphylococci aresusceptible to other penicillinase-stablepenicillins (e.g. Methicillin, Nafcillin), beta-lactam/beta-lactamase inhibitor combinations, andcephems with staphylococcal indications, includingCefazolin.Legend:S = Susceptible  I = IntermediateR = Resistant  NS = Not susceptible* = Not tested  NR = Not reported**NN = See antimicrobic comments  MRSA PCR Screening     Status: None   Collection Time: 04/14/18  2:49 PM  Result Value Ref Range  Status   MRSA by PCR NEGATIVE NEGATIVE Final    Comment:        The GeneXpert MRSA Assay (FDA approved for NASAL specimens only), is one component of a comprehensive MRSA colonization surveillance program. It is not intended to diagnose MRSA infection nor to guide or monitor treatment for MRSA infections. Performed at [facility], [place] [address] [place], [place] [address]   Surgical pcr screen     Status: None   Collection Time: 04/14/18  2:49 PM  Result Value Ref Range Status   MRSA, PCR NEGATIVE NEGATIVE Final   Staphylococcus aureus NEGATIVE NEGATIVE Final    Comment: (NOTE) The Xpert SA Assay (FDA approved for NASAL specimens in patients 41 years of age and older), is one component of a comprehensive surveillance program. It is not intended to diagnose infection nor to guide or monitor treatment. Performed at [facility], [place] [address], [place],  [address]     Radiology Reports Mr Shoulder Right Wo Contrast  Result Date: 03/24/2018 CLINICAL DATA:  Chronic right shoulder pain. Rotator cuff repair 20 years ago. EXAM: MRI OF THE RIGHT SHOULDER WITHOUT CONTRAST TECHNIQUE: Multiplanar, multisequence MR imaging of the shoulder was performed. No intravenous contrast was administered.  COMPARISON:  None. FINDINGS: Despite efforts by the technologist and patient, severe motion artifact is present on today's exam and could not be eliminated. Numerous series were repeated, but were also affected by motion artifact. This reduces exam sensitivity and specificity. Rotator cuff: Full-thickness full width tear of the supraspinatus tendon, the tendon is retracted up to 2.5 cm Full-thickness, full width tear of the infraspinatus tendon retracted 2.0 cm. Suspected small ossifications in the infraspinatus tendon, image 11/8. Mild subscapularis and teres minor tendinopathy. Muscles:  Mild infraspinatus and teres minor atrophy Biceps long head:  Mild tendinopathy of the  intra-articular segment. Acromioclavicular Joint: Moderate spurring. Fluid signal in the joint. Small degenerative subcortical cystic lesions. There is a linear low T1 signal and high T2 signal band in the acromion potentially from a small os acromiale or a healing acromial fracture. Type I acromion. As expected there is a small amount of fluid in the subacromial subdeltoid bursa. Glenohumeral Joint: Mild spurring of the humeral head. No overt joint effusion. Labrum:  Indeterminate due to motion artifact. Bones:  Unremarkable Other: No supplemental non-categorized findings. IMPRESSION: 1. Full-thickness full width tear of the supraspinatus tendon retracted up to 2.5 cm. 2. Full-thickness full width tear of the infraspinatus tendon retracted 2.0 cm. 3. Mild subscapularis and teres minor tendinopathy. 4. Mild infraspinatus and teres minor atrophy. The infraspinatus atrophy could well be due to the rotator cuff rupture. Teres minor atrophy is of less certain cause, but could be due to low-level chronic quadrilateral space syndrome. 5. Moderate degenerative AC joint arthropathy. 6. There is a small band of low T1 and accentuated T2 signal traversing the acromion and best seen for example on image 12/8. This would be an unusual appearance for os acromiale although could conceivably be an anterior os acromiale. This could also be an acromial healing fracture or stress fracture. 7. Despite efforts by the technologist and patient, motion artifact is present on today's exam and could not be eliminated. This reduces exam sensitivity and specificity. Electronically Signed   By: Van Clines M.D.   On: 03/24/2018 10:54   Dg Chest Port 1 View  Result Date: 04/17/2018 CLINICAL DATA:  Increasing shortness of breath over the past several days. History of COPD, previous MI with stent placement, atrial fibrillation. Former smoker. EXAM: PORTABLE CHEST 1 VIEW COMPARISON:  Portable chest x-ray of January 05, 2018 FINDINGS: The  lungs are adequately inflated. The interstitial markings have increased further. The pulmonary vascularity is engorged and the cardiac silhouette enlarged. The trachea is midline. There is no pleural effusion, pneumothorax, or alveolar infiltrate. There is calcification in the wall of the aortic arch. The bony thorax exhibits no acute abnormality. IMPRESSION: Mild to moderate interstitial edema consistent with CHF which has worsened since the previous study. Thoracic aortic atherosclerosis. Electronically Signed   By: David  Martinique M.D.   On: 04/17/2018 09:48     Phillips Climes M.D on 04/18/2018 at 12:09 PM  Between 7am to 7pm - Pager - [phone]  After 7pm go to [email or web address] - password [facility]  [facility] -  Office  [phone]

## 2018-04-18 NOTE — Progress Notes (Signed)
Patient has home CPAP unit with 2L O2 bleed in.

## 2018-04-18 NOTE — Plan of Care (Signed)

## 2018-04-18 NOTE — Consult Note (Signed)
   [facility] CM Inpatient Consult   04/18/2018  Judith [name] 16-Jul-1950 [id number]  Patient screened for extreme high risk score [29%] for unplanned readmissions. Patient has [facility] plan. Spoke with inpatient RNCM, Sam regarding disposition and needs.  She states patient is seeking inpatient rehab and awaiting insurance authorization. No current [facility] follow up assessed if going to inpatient rehab.  Please place a [facility] Care Management consult or for questions contact:   Natividad Brood, RN BSN [facility] Liaison  [phone] business mobile phone Toll free office [phone]

## 2018-04-18 NOTE — Progress Notes (Signed)
Patient ID: Judith [name], female   DOB: 10/21/1950, 67 y.o.   MRN: [id number] Patient is sitting up in bed without complaints.  There is no drainage in the wound VAC canister.  She states she has been approved for inpatient rehab.  Possible discharge tomorrow.  Patient should be discharged with the portable Praveena wound VAC pump and not transferred with the hospital VAC pump.

## 2018-04-19 ENCOUNTER — Encounter (HOSPITAL_COMMUNITY): Admission: RE | Payer: Self-pay | Source: Ambulatory Visit

## 2018-04-19 ENCOUNTER — Ambulatory Visit (HOSPITAL_COMMUNITY): Admission: RE | Admit: 2018-04-19 | Payer: Medicare PPO | Source: Ambulatory Visit | Admitting: Orthopedic Surgery

## 2018-04-19 DIAGNOSIS — I4891 Unspecified atrial fibrillation: Secondary | ICD-10-CM

## 2018-04-19 DIAGNOSIS — E1142 Type 2 diabetes mellitus with diabetic polyneuropathy: Secondary | ICD-10-CM

## 2018-04-19 DIAGNOSIS — F418 Other specified anxiety disorders: Secondary | ICD-10-CM

## 2018-04-19 DIAGNOSIS — I25118 Atherosclerotic heart disease of native coronary artery with other forms of angina pectoris: Secondary | ICD-10-CM

## 2018-04-19 DIAGNOSIS — M869 Osteomyelitis, unspecified: Secondary | ICD-10-CM

## 2018-04-19 DIAGNOSIS — I10 Essential (primary) hypertension: Secondary | ICD-10-CM

## 2018-04-19 DIAGNOSIS — G4733 Obstructive sleep apnea (adult) (pediatric): Secondary | ICD-10-CM

## 2018-04-19 DIAGNOSIS — K219 Gastro-esophageal reflux disease without esophagitis: Secondary | ICD-10-CM

## 2018-04-19 LAB — CBC
HCT: 29.5 % — ABNORMAL LOW (ref 36.0–46.0)
Hemoglobin: 9.2 g/dL — ABNORMAL LOW (ref 12.0–15.0)
MCH: 27.3 pg (ref 26.0–34.0)
MCHC: 31.2 g/dL (ref 30.0–36.0)
MCV: 87.5 fL (ref 80.0–100.0)
Platelets: 465 10*3/uL — ABNORMAL HIGH (ref 150–400)
RBC: 3.37 MIL/uL — ABNORMAL LOW (ref 3.87–5.11)
RDW: 16.9 % — ABNORMAL HIGH (ref 11.5–15.5)
WBC: 9.7 10*3/uL (ref 4.0–10.5)
nRBC: 0.3 % — ABNORMAL HIGH (ref 0.0–0.2)

## 2018-04-19 LAB — BASIC METABOLIC PANEL
ANION GAP: 9 (ref 5–15)
BUN: 24 mg/dL — ABNORMAL HIGH (ref 8–23)
CO2: 27 mmol/L (ref 22–32)
CREATININE: 1.43 mg/dL — AB (ref 0.44–1.00)
Calcium: 8.6 mg/dL — ABNORMAL LOW (ref 8.9–10.3)
Chloride: 101 mmol/L (ref 98–111)
GFR calc Af Amer: 44 mL/min — ABNORMAL LOW (ref >60)
GFR calc non Af Amer: 38 mL/min — ABNORMAL LOW (ref >60)
Glucose, Bld: 65 mg/dL — ABNORMAL LOW (ref 70–99)
Potassium: 4.2 mmol/L (ref 3.5–5.1)
Sodium: 137 mmol/L (ref 135–145)

## 2018-04-19 LAB — GLUCOSE, CAPILLARY
Glucose-Capillary: 66 mg/dL — ABNORMAL LOW (ref 70–99)
Glucose-Capillary: 117 mg/dL — ABNORMAL HIGH (ref 70–99)
Glucose-Capillary: 123 mg/dL — ABNORMAL HIGH (ref 70–99)
Glucose-Capillary: 190 mg/dL — ABNORMAL HIGH (ref 70–99)

## 2018-04-19 SURGERY — ARTHROSCOPY, SHOULDER
Anesthesia: General | Laterality: Right

## 2018-04-19 MED ORDER — ORAL CARE MOUTH RINSE
15.0000 mL | Freq: Two times a day (BID) | OROMUCOSAL | Status: DC
  Administered 2018-04-19 – 2018-04-20 (×3): 15 mL via OROMUCOSAL

## 2018-04-19 MED ORDER — FUROSEMIDE 10 MG/ML IJ SOLN
40.0000 mg | Freq: Three times a day (TID) | INTRAMUSCULAR | Status: DC
  Administered 2018-04-19 – 2018-04-20 (×3): 40 mg via INTRAVENOUS
  Filled 2018-04-19 (×3): qty 4

## 2018-04-19 NOTE — Progress Notes (Signed)
Physical Therapy Treatment Patient Details Name: Judith [name] MRN: [id number] DOB: 1950-08-31 Today's Date: 04/19/2018    History of Present Illness Pt is a 67 y.o. F with significant PMH of osteomyelitis of right foot, chronic kidney disease, CAD, diabetes mellitus with peripheral neuropathy, paroxysmal atrial fibrillation, who presents s/p right transtibial amputation.    PT Comments    Patient seen for activity progression. Today's session focused on transfer training and introduction to therapeutic exercise for residual limb. Patient tolerated session well with 3 squat pivot transfers and amputee rehab program. Current POC remains appropriate.    Follow Up Recommendations  CIR     Equipment Recommendations  Wheelchair (measurements PT)    Recommendations for Other Services OT consult;Rehab consult     Precautions / Restrictions Precautions Precautions: Fall Precaution Comments: right BKA Required Braces or Orthoses: Other Brace Other Brace: limb guard Restrictions Weight Bearing Restrictions: Yes RLE Weight Bearing: Non weight bearing    Mobility  Bed Mobility Overal bed mobility: Needs Assistance Bed Mobility: Supine to Sit     Supine to sit: Min assist     General bed mobility comments: min assist to elevate trunk to uprigh tat EOB  Transfers Overall transfer level: Needs assistance   Transfers: Squat Pivot Transfers     Squat pivot transfers: Min assist     General transfer comment: min assist for power up and positioning, perfomed x3 during session with education for chair position and UE placement. patient using countdown method and rocker technique to build moment prior to transition  Ambulation/Gait                 Stairs             Wheelchair Mobility    Modified Rankin (Stroke Patients Only)       Balance Overall balance assessment: Needs assistance Sitting-balance support: Feet supported Sitting balance-Leahy Scale:  Fair     Standing balance support: Bilateral upper extremity supported Standing balance-Leahy Scale: Poor                              Cognition Arousal/Alertness: Awake/alert Behavior During Therapy: WFL for tasks assessed/performed Overall Cognitive Status: Impaired/Different from baseline Area of Impairment: Memory;Safety/judgement;Attention                   Current Attention Level: Selective Memory: Decreased short-term memory   Safety/Judgement: Decreased awareness of safety;Decreased awareness of deficits     General Comments: pt with memory loss at baseline, has hx of falls out of chair when reaching to pick up item from the floor      Exercises Amputee Exercises Quad Sets: AROM;Right;10 reps Hip Flexion/Marching: AROM;Right;10 reps Knee Flexion: AROM;Right;10 reps Knee Extension: AROM;Right;10 reps    General Comments        Pertinent Vitals/Pain Pain Assessment: Faces Faces Pain Scale: Hurts even more Pain Location: residual limb when applying hard shell immobilizer Pain Descriptors / Indicators: Grimacing;Guarding Pain Intervention(s): Monitored during session    Home Living                      Prior Function            PT Goals (current goals can now be found in the care plan section) Acute Rehab PT Goals Patient Stated Goal: "get a prosthetic." PT Goal Formulation: With patient/family Time For Goal Achievement: 04/30/18 Potential to Achieve Goals: Good Progress  towards PT goals: Progressing toward goals    Frequency    Min 3X/week      PT Plan Current plan remains appropriate    Co-evaluation              AM-PAC PT "6 Clicks" Mobility   Outcome Measure  Help needed turning from your back to your side while in a flat bed without using bedrails?: None Help needed moving from lying on your back to sitting on the side of a flat bed without using bedrails?: A Little Help needed moving to and from a bed  to a chair (including a wheelchair)?: A Lot Help needed standing up from a chair using your arms (e.g., wheelchair or bedside chair)?: A Lot Help needed to walk in hospital room?: Total Help needed climbing 3-5 steps with a railing? : Total 6 Click Score: 13    End of Session Equipment Utilized During Treatment: Gait belt Activity Tolerance: Patient tolerated treatment well Patient left: in chair;with call bell/phone within reach;with family/visitor present;with chair alarm set Nurse Communication: Mobility status PT Visit Diagnosis: Unsteadiness on feet (R26.81);Other abnormalities of gait and mobility (R26.89)     Time: 5379-4327 PT Time Calculation (min) (ACUTE ONLY): 20 min  Charges:  $Therapeutic Activity: 8-22 mins                     Alben Deeds, PT DPT  Board Certified Neurologic Specialist [place] Pager [phone] Office [phone]    Duncan Dull 04/19/2018, 11:38 AM

## 2018-04-19 NOTE — Progress Notes (Signed)
Inpatient Rehabilitation-Admissions Coordinator   Pt's insurance company did not reach out for peer to peer conference yesterday with Dr. Waldron Labs. Due to switch in physician schedules, AC spoke with Dr. Reesa Chew, the new assigned attending, regarding peer to peer. Dr Reesa Chew agreeable to complete peer to peer. Hopefull for determination today.  Will update once final determination has been made.   Please call if questions.   Judith [name], OTR/L  Rehab Admissions Coordinator  [phone] 04/19/2018 12:00 PM

## 2018-04-19 NOTE — Progress Notes (Signed)
Inpatient Diabetes Program Recommendations  AACE/ADA: New Consensus Statement on Inpatient Glycemic Control (2015)  Target Ranges:  Prepandial:   less than 140 mg/dL      Peak postprandial:   less than 180 mg/dL (1-2 hours)      Critically ill patients:  140 - 180 mg/dL   Lab Results  Component Value Date   GLUCAP 117 (H) 04/19/2018   HGBA1C 8.0 (H) 04/14/2018    Review of Glycemic ControlResults for Judith [name], Judith [name] (MRN [id number]) as of 04/19/2018 12:55  Ref. Range 04/18/2018 08:10 04/18/2018 11:54 04/18/2018 16:45 04/18/2018 21:11 04/19/2018 07:53 04/19/2018 11:59  Glucose-Capillary Latest Ref Range: 70 - 99 mg/dL 57 (L) 101 (H) 89 77 66 (L) 117 (H)    Diabetes history: DM 2  Outpatient Diabetes medications: Metformin-XR 2000 mg bid, Tresiba 20 mg q evening Current orders for Inpatient glycemic control:  Lantus 5 units q HS  Inpatient Diabetes Program Recommendations:   Blood sugars<100 mg/dL.  Does not seem to need basal insulin?  Consider holding basal insulin and continue checking blood sugars tid with meals and HS.    Thanks,  Adah Perl, RN, BC-ADM Inpatient Diabetes Coordinator Pager [phone] (8a-5p)

## 2018-04-19 NOTE — Care Management Important Message (Signed)
Important Message  Patient Details  Name: Judith [name] MRN: [id number] Date of Birth: 1950-12-24   Medicare Important Message Given:  Yes    Barb Merino Fatiha Guzy 04/19/2018, 12:18 PM

## 2018-04-19 NOTE — Progress Notes (Signed)
PROGRESS NOTE    Judith [name]  VZD:[id number] DOB: [birth year] DOA: 04/14/2018 PCP: Rutherford Guys, MD   Brief Narrative:  67 year old with a history of osteomyelitis on the right, CKD stage III, coronary artery disease, diabetes mellitus with peripheral neuropathy, paroxysmal atrial fibrillation, sleep apnea came to the hospital with complaints of right-sided foot osteomyelitis.  Patient was admitted to our service due to atrial fibrillation with RVR and surgery had to be held off.  Rate was controlled and patient underwent right-sided transtibial amputation by Dr. Sharol Given on 04/15/2018.  Following this patient developed melanotic stool especially when being on anticoagulation.  Underwent endoscopy on 04/16/18 which showed duodenal ulcer, mild esophagitis.  Patient required transfusion.   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  Appreciate orthopedic input.  PT/OT recommending CIR, pain control as needed -In my opinion patient requires significant assistance and especially physician supervision initially while getting rehabilitation in the setting of at the risk of blood loss from the GI sources  of the surgical site.  She is currently on anticoagulation for atrial fibrillation.  Paroxysmal atrial fibrillation with rapid ventricular response, improved - Currently patient has been titrated off of Cardizem drip.  Currently doing well with rate control strategy.  Currently on Eliquis.  Acute blood loss anemia in the setting of upper GI bleed -Underwent endoscopy on 12/8.  Showed esophagitis with superficial duodenal ulcer.  Currently on PPI.  Avoid NSAIDs.  Acute hypoxic respiratory failure likely from fluid overload Acute on chronic diastolic congestive heart failure, class III,  -Patient has improved significantly with gentle diuresis.  Last echocardiogram about a year ago showed ejection fraction about 55% with grade 2 diastolic dysfunction. -We will plan on increasing Lasix to 3 times daily for now, monitor electrolytes  Insulin-dependent diabetes mellitus type 2 -Due to episodes of hypoglycemia, will discontinue Lantus for now.  Continue insulin sliding scale as needed  Essential hypertension -Resume home blood pressure medicines as appropriate  History of depression and anxiety -Resume home meds  Coronary artery disease -Currently chest pain-free.  Continue statin and beta-blocker  Obstructive sleep apnea -On CPAP  Hyperthyroidism -Continue methimazole  DVT prophylaxis: Eliquis Code Status:  full code Family Communication:   none at bedside Disposition Plan: We will continue IV diuresis today.  We are hoping patient would eventually get transition to inpatient rehabilitation.  She requires inpatient rehab as she requires supervision by physician during her recovery process in the setting of possible bleeding while on Eliquis and also monitoring her heart rate and volume status  Consultants:   Ortho  GI  Procedures:   Transtibial amputation 12/7  Endoscopy 12/8  2 units PRBC transfusion  Antimicrobials:   None   Subjective: Patient reports  of quite a  bit of weakness.  Review of Systems Otherwise negative except as per HPI, including: General: Denies fever, chills, night sweats or unintended weight loss. Resp: Denies cough, wheezing Cardiac: Denies chest pain, palpitations, orthopnea, paroxysmal nocturnal dyspnea. GI: Denies abdominal pain, nausea, vomiting, diarrhea or constipation GU: Denies dysuria, frequency, hesitancy or incontinence MS: Denies muscle aches, joint pain or swelling Neuro: Denies headache, neurologic deficits (focal weakness, numbness, tingling), abnormal gait Psych: Denies anxiety, depression, SI/HI/AVH Skin: Denies new rashes or lesions ID: Denies sick contacts, exotic exposures, travel  Objective: Vitals:   04/18/18 2324 04/19/18 0414 04/19/18 0843 04/19/18 1157  BP: (!) 153/77 (!) 156/79 (!) 151/89 (!) 108/57  Pulse: (!) 58 (!) 48 (!) 52 (!) 54  Resp: 17 13  14   Temp: 98 F (36.7 C) (!) 97 F (36.1 C)  97.6 F (36.4 C)  TempSrc: Oral Axillary  Oral  SpO2: 90% 96%  97%  Weight:      Height:        Intake/Output Summary (Last 24 hours) at 04/19/2018 1319 Last data filed at 04/19/2018 1000 Gross per 24 hour  Intake 140 ml  Output 3500 ml  Net -3360 ml   Filed Weights   04/14/18 1032  Weight: 72.6 kg    Examination:  General exam: Appears calm and comfortable  Respiratory system: Diminished breath sounds at the bases Cardiovascular system: S1 & S2 heard, RRR. No JVD, murmurs, rubs, gallops or clicks. No pedal edema. Gastrointestinal system: Abdomen is nondistended, soft and nontender. No organomegaly or masses felt. Normal bowel sounds heard. Central nervous system: Alert and oriented. No focal neurological deficits. Extremities: Right lower extremity amputation noted with wound VAC in place. Skin: No rashes, lesions or ulcers Psychiatry: Judgement and insight appear normal. Mood & affect appropriate.     Data Reviewed:   CBC: Recent Labs  Lab 04/15/18 0252 04/15/18 1902  04/16/18 0057 04/17/18 0742 04/18/18 0230 04/19/18 0323  WBC 7.1  --  6.8 8.8 10.4 9.7  HGB 7.5* 7.9* 7.7* 9.8* 9.0* 9.2*  HCT 23.7* 25.1* 24.6* 31.8* 28.8* 29.5*  MCV 87.1  --  85.7 86.9 87.0 87.5  PLT 323  --  294 387 381 147*   Basic Metabolic Panel: Recent Labs  Lab 04/15/18 0252 04/16/18 0057 04/17/18 0742 04/18/18 0230 04/19/18 0323  NA 132* 131* 137 136 137  K 3.7 3.9 4.0 4.3 4.2  CL 96* 98 100 101 101  CO2 24 20* 27 23 27   GLUCOSE 181* 132* 94 91 65*  BUN 26* 31* 27* 30* 24*  CREATININE 1.45* 1.79* 1.75* 1.61* 1.43*  CALCIUM 7.7* 7.6* 8.1* 8.1* 8.6*   GFR: Estimated Creatinine Clearance: 37.9 mL/min (A) (by C-G formula based on SCr of 1.43 mg/dL (H)). Liver Function Tests: No results for input(s): AST, ALT, ALKPHOS, BILITOT, PROT, ALBUMIN in the last 168 hours. No results for input(s): LIPASE, AMYLASE in the last 168 hours. No results for input(s): AMMONIA in the last 168 hours. Coagulation Profile: Recent Labs  Lab 04/14/18 1106  INR 1.38   Cardiac Enzymes: No results for input(s): CKTOTAL, CKMB, CKMBINDEX, TROPONINI in the last 168 hours. BNP (last 3 results) No results for input(s): PROBNP in the last 8760 hours. HbA1C: No results for input(s): HGBA1C in the last 72 hours. CBG: Recent Labs  Lab 04/18/18 1154 04/18/18 1645 04/18/18 2111 04/19/18 0753 04/19/18 1159  GLUCAP 101* 89 77 66* 117*   Lipid Profile: No results for input(s): CHOL, HDL, LDLCALC,  TRIG, CHOLHDL, LDLDIRECT in the last 72 hours. Thyroid Function Tests: No results for input(s): TSH, T4TOTAL, FREET4, T3FREE, THYROIDAB in the last 72 hours. Anemia Panel: No results for input(s): VITAMINB12, FOLATE, FERRITIN, TIBC, IRON, RETICCTPCT in the last 72 hours. Sepsis Labs: No results for input(s): PROCALCITON, LATICACIDVEN in the last 168 hours.  Recent Results (from the past 240 hour(s))  WOUND CULTURE     Status: Abnormal   Collection Time: 04/13/18 10:39 AM  Result Value Ref  Range Status   MICRO NUMBER: [id number]  Final   SPECIMEN QUALITY: Adequate  Final   SOURCE: WOUND (SITE NOT SPECIFIED)  Final   STATUS: FINAL  Final   GRAM STAIN:   Final    Few Polymorphonuclear leukocytes Many Gram positive cocci in clusters   ISOLATE 1: Staphylococcus aureus (A)  Final    Comment: Heavy growth of Staphylococcus aureus      Susceptibility   Staphylococcus aureus - AEROBIC CULT, GRAM STAIN POSITIVE 1    VANCOMYCIN 1 Sensitive     CIPROFLOXACIN <=0.5 Sensitive     CLINDAMYCIN <=0.25 Sensitive     LEVOFLOXACIN 0.25 Sensitive     ERYTHROMYCIN <=0.25 Sensitive     GENTAMICIN <=0.5 Sensitive     OXACILLIN* 0.5 Sensitive      * Oxacillin-susceptible staphylococci aresusceptible to other penicillinase-stablepenicillins (e.g. Methicillin, Nafcillin), beta-lactam/beta-lactamase inhibitor combinations, andcephems with staphylococcal indications, includingCefazolin.    TETRACYCLINE <=1 Sensitive     TRIMETH/SULFA* <=10 Sensitive      * Oxacillin-susceptible staphylococci aresusceptible to other penicillinase-stablepenicillins (e.g. Methicillin, Nafcillin), beta-lactam/beta-lactamase inhibitor combinations, andcephems with staphylococcal indications, includingCefazolin.Legend:S = Susceptible  I = IntermediateR = Resistant  NS = Not susceptible* = Not tested  NR = Not reported**NN = See antimicrobic comments  MRSA PCR Screening     Status: None   Collection Time: 04/14/18  2:49 PM  Result Value Ref Range Status   MRSA by PCR NEGATIVE NEGATIVE Final    Comment:        The GeneXpert MRSA Assay (FDA approved for NASAL specimens only), is one component of a comprehensive MRSA colonization surveillance program. It is not intended to diagnose MRSA infection nor to guide or monitor treatment for MRSA infections. Performed at [facility], [place] [address] [place], [place] [address]   Surgical pcr screen     Status: None   Collection Time: 04/14/18  2:49 PM  Result Value Ref  Range Status   MRSA, PCR NEGATIVE NEGATIVE Final   Staphylococcus aureus NEGATIVE NEGATIVE Final    Comment: (NOTE) The Xpert SA Assay (FDA approved for NASAL specimens in patients 106 years of age and older), is one component of a comprehensive surveillance program. It is not intended to diagnose infection nor to guide or monitor treatment. Performed at [facility], [place] [address], [place], [place] [address]          Radiology Studies: No results found.      Scheduled Meds: . sodium chloride   Intravenous Once  . sodium chloride   Intravenous Once  . apixaban  5 mg Oral BID  . atorvastatin  20 mg Oral QPM  . buPROPion  150 mg Oral BID  . docusate sodium  100 mg Oral BID  . fenofibrate  160 mg Oral Daily  . FLUoxetine  20 mg Oral QHS  . furosemide  40 mg Intravenous BID  . gabapentin  900 mg Oral BID  . insulin glargine  5 Units Subcutaneous QHS  .  mouth rinse  15 mL Mouth Rinse BID  . methimazole  10 mg Oral Daily  . metoprolol succinate  25 mg Oral BID  . pantoprazole  40 mg Oral BID  . potassium chloride SA  20 mEq Oral Daily  . sotalol  120 mg Oral Q12H   Continuous Infusions: . sodium chloride Stopped (04/16/18 1149)  . lactated ringers Stopped (04/14/18 1900)  . lactated ringers 10 mL/hr at 04/15/18 2300  . methocarbamol (ROBAXIN) IV       LOS: 5 days   Time spent= 30 mins    Dimetri Armitage Arsenio Loader, MD [facility] Pager [phone]   If 7PM-7AM, please contact night-coverage [email or web address] Password [email or web address] 04/19/2018, 1:19 PM

## 2018-04-19 NOTE — Progress Notes (Signed)
Patient ID: Judith [name], female   DOB: 11/09/1950, 67 y.o.   MRN: [id number] No drainage in [facility], patient states her leg feels sore, initial CIR admission denied by insurance

## 2018-04-20 ENCOUNTER — Inpatient Hospital Stay (HOSPITAL_COMMUNITY)
Admission: RE | Admit: 2018-04-20 | Discharge: 2018-05-02 | DRG: 560 | Disposition: A | Discharge status: Home Health | Payer: Medicare PPO | Source: Intra-hospital | Attending: Physical Medicine & Rehabilitation | Admitting: Physical Medicine & Rehabilitation

## 2018-04-20 ENCOUNTER — Encounter (HOSPITAL_COMMUNITY): Payer: Self-pay

## 2018-04-20 DIAGNOSIS — K5901 Slow transit constipation: Secondary | ICD-10-CM | POA: Diagnosis not present

## 2018-04-20 DIAGNOSIS — I5042 Chronic combined systolic (congestive) and diastolic (congestive) heart failure: Secondary | ICD-10-CM

## 2018-04-20 DIAGNOSIS — Z794 Long term (current) use of insulin: Secondary | ICD-10-CM

## 2018-04-20 DIAGNOSIS — E1151 Type 2 diabetes mellitus with diabetic peripheral angiopathy without gangrene: Secondary | ICD-10-CM

## 2018-04-20 DIAGNOSIS — M19041 Primary osteoarthritis, right hand: Secondary | ICD-10-CM | POA: Diagnosis present

## 2018-04-20 DIAGNOSIS — I2510 Atherosclerotic heart disease of native coronary artery without angina pectoris: Secondary | ICD-10-CM | POA: Diagnosis present

## 2018-04-20 DIAGNOSIS — M19042 Primary osteoarthritis, left hand: Secondary | ICD-10-CM | POA: Diagnosis present

## 2018-04-20 DIAGNOSIS — Z4781 Encounter for orthopedic aftercare following surgical amputation: Principal | ICD-10-CM

## 2018-04-20 DIAGNOSIS — Z91048 Other nonmedicinal substance allergy status: Secondary | ICD-10-CM

## 2018-04-20 DIAGNOSIS — Z885 Allergy status to narcotic agent status: Secondary | ICD-10-CM

## 2018-04-20 DIAGNOSIS — E0590 Thyrotoxicosis, unspecified without thyrotoxic crisis or storm: Secondary | ICD-10-CM | POA: Diagnosis present

## 2018-04-20 DIAGNOSIS — I480 Paroxysmal atrial fibrillation: Secondary | ICD-10-CM | POA: Diagnosis not present

## 2018-04-20 DIAGNOSIS — I5032 Chronic diastolic (congestive) heart failure: Secondary | ICD-10-CM | POA: Diagnosis not present

## 2018-04-20 DIAGNOSIS — Z955 Presence of coronary angioplasty implant and graft: Secondary | ICD-10-CM

## 2018-04-20 DIAGNOSIS — Z79891 Long term (current) use of opiate analgesic: Secondary | ICD-10-CM

## 2018-04-20 DIAGNOSIS — E785 Hyperlipidemia, unspecified: Secondary | ICD-10-CM | POA: Diagnosis present

## 2018-04-20 DIAGNOSIS — E1122 Type 2 diabetes mellitus with diabetic chronic kidney disease: Secondary | ICD-10-CM | POA: Diagnosis present

## 2018-04-20 DIAGNOSIS — K2210 Ulcer of esophagus without bleeding: Secondary | ICD-10-CM | POA: Diagnosis present

## 2018-04-20 DIAGNOSIS — W19XXXD Unspecified fall, subsequent encounter: Secondary | ICD-10-CM | POA: Diagnosis not present

## 2018-04-20 DIAGNOSIS — K5900 Constipation, unspecified: Secondary | ICD-10-CM | POA: Diagnosis present

## 2018-04-20 DIAGNOSIS — Z888 Allergy status to other drugs, medicaments and biological substances status: Secondary | ICD-10-CM

## 2018-04-20 DIAGNOSIS — Z79899 Other long term (current) drug therapy: Secondary | ICD-10-CM

## 2018-04-20 DIAGNOSIS — Z87891 Personal history of nicotine dependence: Secondary | ICD-10-CM | POA: Diagnosis not present

## 2018-04-20 DIAGNOSIS — F329 Major depressive disorder, single episode, unspecified: Secondary | ICD-10-CM | POA: Diagnosis present

## 2018-04-20 DIAGNOSIS — G4730 Sleep apnea, unspecified: Secondary | ICD-10-CM | POA: Diagnosis present

## 2018-04-20 DIAGNOSIS — K219 Gastro-esophageal reflux disease without esophagitis: Secondary | ICD-10-CM | POA: Diagnosis present

## 2018-04-20 DIAGNOSIS — I130 Hypertensive heart and chronic kidney disease with heart failure and stage 1 through stage 4 chronic kidney disease, or unspecified chronic kidney disease: Secondary | ICD-10-CM | POA: Diagnosis present

## 2018-04-20 DIAGNOSIS — R413 Other amnesia: Secondary | ICD-10-CM | POA: Diagnosis present

## 2018-04-20 DIAGNOSIS — N183 Chronic kidney disease, stage 3 unspecified: Secondary | ICD-10-CM

## 2018-04-20 DIAGNOSIS — Z880 Allergy status to penicillin: Secondary | ICD-10-CM

## 2018-04-20 DIAGNOSIS — Z8619 Personal history of other infectious and parasitic diseases: Secondary | ICD-10-CM | POA: Diagnosis not present

## 2018-04-20 DIAGNOSIS — Z801 Family history of malignant neoplasm of trachea, bronchus and lung: Secondary | ICD-10-CM

## 2018-04-20 DIAGNOSIS — Y92230 Patient room in hospital as the place of occurrence of the external cause: Secondary | ICD-10-CM | POA: Diagnosis not present

## 2018-04-20 DIAGNOSIS — D62 Acute posthemorrhagic anemia: Secondary | ICD-10-CM | POA: Diagnosis not present

## 2018-04-20 DIAGNOSIS — S88111A Complete traumatic amputation at level between knee and ankle, right lower leg, initial encounter: Secondary | ICD-10-CM | POA: Diagnosis not present

## 2018-04-20 DIAGNOSIS — Z803 Family history of malignant neoplasm of breast: Secondary | ICD-10-CM

## 2018-04-20 DIAGNOSIS — Z87410 Personal history of cervical dysplasia: Secondary | ICD-10-CM

## 2018-04-20 DIAGNOSIS — R7309 Other abnormal glucose: Secondary | ICD-10-CM

## 2018-04-20 DIAGNOSIS — Z89431 Acquired absence of right foot: Secondary | ICD-10-CM

## 2018-04-20 DIAGNOSIS — E1142 Type 2 diabetes mellitus with diabetic polyneuropathy: Secondary | ICD-10-CM | POA: Diagnosis present

## 2018-04-20 DIAGNOSIS — Z806 Family history of leukemia: Secondary | ICD-10-CM

## 2018-04-20 DIAGNOSIS — Z91041 Radiographic dye allergy status: Secondary | ICD-10-CM

## 2018-04-20 DIAGNOSIS — Z7901 Long term (current) use of anticoagulants: Secondary | ICD-10-CM

## 2018-04-20 DIAGNOSIS — E039 Hypothyroidism, unspecified: Secondary | ICD-10-CM | POA: Diagnosis present

## 2018-04-20 DIAGNOSIS — Z90710 Acquired absence of both cervix and uterus: Secondary | ICD-10-CM

## 2018-04-20 DIAGNOSIS — Z8249 Family history of ischemic heart disease and other diseases of the circulatory system: Secondary | ICD-10-CM

## 2018-04-20 DIAGNOSIS — W19XXXA Unspecified fall, initial encounter: Secondary | ICD-10-CM | POA: Diagnosis not present

## 2018-04-20 DIAGNOSIS — Z89511 Acquired absence of right leg below knee: Secondary | ICD-10-CM

## 2018-04-20 DIAGNOSIS — R0989 Other specified symptoms and signs involving the circulatory and respiratory systems: Secondary | ICD-10-CM | POA: Diagnosis not present

## 2018-04-20 DIAGNOSIS — I739 Peripheral vascular disease, unspecified: Secondary | ICD-10-CM | POA: Diagnosis not present

## 2018-04-20 DIAGNOSIS — I10 Essential (primary) hypertension: Secondary | ICD-10-CM | POA: Diagnosis not present

## 2018-04-20 DIAGNOSIS — Z820 Family history of epilepsy and other diseases of the nervous system: Secondary | ICD-10-CM

## 2018-04-20 DIAGNOSIS — E119 Type 2 diabetes mellitus without complications: Secondary | ICD-10-CM | POA: Diagnosis not present

## 2018-04-20 DIAGNOSIS — Z825 Family history of asthma and other chronic lower respiratory diseases: Secondary | ICD-10-CM

## 2018-04-20 LAB — BASIC METABOLIC PANEL
Anion gap: 9 (ref 5–15)
BUN: 21 mg/dL (ref 8–23)
CO2: 28 mmol/L (ref 22–32)
Calcium: 8.7 mg/dL — ABNORMAL LOW (ref 8.9–10.3)
Chloride: 99 mmol/L (ref 98–111)
Creatinine, Ser: 1.4 mg/dL — ABNORMAL HIGH (ref 0.44–1.00)
GFR calc Af Amer: 46 mL/min — ABNORMAL LOW (ref >60)
GFR, EST NON AFRICAN AMERICAN: 39 mL/min — AB (ref >60)
Glucose, Bld: 211 mg/dL — ABNORMAL HIGH (ref 70–99)
Potassium: 4.3 mmol/L (ref 3.5–5.1)
Sodium: 136 mmol/L (ref 135–145)

## 2018-04-20 LAB — GLUCOSE, CAPILLARY
Glucose-Capillary: 125 mg/dL — ABNORMAL HIGH (ref 70–99)
Glucose-Capillary: 126 mg/dL — ABNORMAL HIGH (ref 70–99)
Glucose-Capillary: 189 mg/dL — ABNORMAL HIGH (ref 70–99)
Glucose-Capillary: 128 mg/dL — ABNORMAL HIGH (ref 70–99)

## 2018-04-20 LAB — MAGNESIUM: Magnesium: 1.3 mg/dL — ABNORMAL LOW (ref 1.7–2.4)

## 2018-04-20 MED ORDER — SOTALOL HCL 120 MG PO TABS
120.0000 mg | ORAL_TABLET | Freq: Two times a day (BID) | ORAL | Status: DC
  Administered 2018-04-20 – 2018-05-02 (×24): 120 mg via ORAL
  Filled 2018-04-20 (×26): qty 1

## 2018-04-20 MED ORDER — DOCUSATE SODIUM 100 MG PO CAPS
100.0000 mg | ORAL_CAPSULE | Freq: Two times a day (BID) | ORAL | Status: DC
  Administered 2018-04-20 – 2018-04-24 (×5): 100 mg via ORAL
  Filled 2018-04-20 (×13): qty 1

## 2018-04-20 MED ORDER — POTASSIUM CHLORIDE CRYS ER 20 MEQ PO TBCR
20.0000 meq | EXTENDED_RELEASE_TABLET | Freq: Every day | ORAL | Status: DC
  Administered 2018-04-20 – 2018-05-02 (×13): 20 meq via ORAL
  Filled 2018-04-20 (×12): qty 1

## 2018-04-20 MED ORDER — GABAPENTIN 300 MG PO CAPS
900.0000 mg | ORAL_CAPSULE | Freq: Two times a day (BID) | ORAL | Status: DC
  Administered 2018-04-20 – 2018-04-30 (×21): 900 mg via ORAL
  Filled 2018-04-20 (×16): qty 3
  Filled 2018-04-20: qty 9
  Filled 2018-04-20 (×6): qty 3

## 2018-04-20 MED ORDER — METHOCARBAMOL 500 MG PO TABS: 500.0000 mg | ORAL_TABLET | Freq: Four times a day (QID) | ORAL | Status: DC | PRN

## 2018-04-20 MED ORDER — PANTOPRAZOLE SODIUM 40 MG PO TBEC
40.0000 mg | DELAYED_RELEASE_TABLET | Freq: Two times a day (BID) | ORAL | Status: DC
  Administered 2018-04-20 – 2018-05-02 (×24): 40 mg via ORAL
  Filled 2018-04-20 (×24): qty 1

## 2018-04-20 MED ORDER — ACETAMINOPHEN 325 MG PO TABS
325.0000 mg | ORAL_TABLET | Freq: Four times a day (QID) | ORAL | Status: DC | PRN
  Administered 2018-04-23 – 2018-05-02 (×15): 650 mg via ORAL
  Filled 2018-04-20 (×19): qty 2

## 2018-04-20 MED ORDER — METHOCARBAMOL 1000 MG/10ML IJ SOLN
500.0000 mg | Freq: Four times a day (QID) | INTRAVENOUS | Status: DC | PRN
  Filled 2018-04-20: qty 5

## 2018-04-20 MED ORDER — ATORVASTATIN CALCIUM 10 MG PO TABS
20.0000 mg | ORAL_TABLET | Freq: Every evening | ORAL | Status: DC
  Administered 2018-04-20 – 2018-05-01 (×12): 20 mg via ORAL
  Filled 2018-04-20 (×12): qty 2

## 2018-04-20 MED ORDER — FENOFIBRATE 160 MG PO TABS
160.0000 mg | ORAL_TABLET | Freq: Every day | ORAL | Status: DC
  Administered 2018-04-21 – 2018-05-02 (×12): 160 mg via ORAL
  Filled 2018-04-20 (×12): qty 1

## 2018-04-20 MED ORDER — POLYETHYLENE GLYCOL 3350 17 G PO PACK: 17.0000 g | PACK | Freq: Every day | ORAL | Status: DC | PRN

## 2018-04-20 MED ORDER — BISACODYL 10 MG RE SUPP: 10.0000 mg | Freq: Every day | RECTAL | Status: DC | PRN

## 2018-04-20 MED ORDER — METHIMAZOLE 10 MG PO TABS
10.0000 mg | ORAL_TABLET | Freq: Every day | ORAL | Status: DC
  Administered 2018-04-21 – 2018-05-02 (×12): 10 mg via ORAL
  Filled 2018-04-20 (×12): qty 1

## 2018-04-20 MED ORDER — ALBUTEROL SULFATE (2.5 MG/3ML) 0.083% IN NEBU: 2.5000 mg | INHALATION_SOLUTION | Freq: Four times a day (QID) | RESPIRATORY_TRACT | Status: DC | PRN

## 2018-04-20 MED ORDER — APIXABAN 5 MG PO TABS
5.0000 mg | ORAL_TABLET | Freq: Two times a day (BID) | ORAL | Status: DC
  Administered 2018-04-20 – 2018-05-02 (×24): 5 mg via ORAL
  Filled 2018-04-20 (×24): qty 1

## 2018-04-20 MED ORDER — HYDROCODONE-ACETAMINOPHEN 5-325 MG PO TABS
1.0000 | ORAL_TABLET | ORAL | Status: DC | PRN
  Administered 2018-04-20 – 2018-04-21 (×5): 2 via ORAL
  Administered 2018-04-22: 1 via ORAL
  Administered 2018-04-22 (×2): 2 via ORAL
  Administered 2018-04-24 – 2018-05-01 (×7): 1 via ORAL
  Administered 2018-05-01 – 2018-05-02 (×3): 2 via ORAL
  Administered 2018-05-02: 1 via ORAL
  Filled 2018-04-20 (×2): qty 2
  Filled 2018-04-20: qty 1
  Filled 2018-04-20: qty 2
  Filled 2018-04-20 (×2): qty 1
  Filled 2018-04-20 (×5): qty 2
  Filled 2018-04-20 (×2): qty 1
  Filled 2018-04-20: qty 2
  Filled 2018-04-20: qty 1
  Filled 2018-04-20: qty 2
  Filled 2018-04-20 (×3): qty 1

## 2018-04-20 MED ORDER — FLUOXETINE HCL 20 MG PO CAPS
20.0000 mg | ORAL_CAPSULE | Freq: Every day | ORAL | Status: DC
  Administered 2018-04-20 – 2018-05-01 (×12): 20 mg via ORAL
  Filled 2018-04-20 (×12): qty 1

## 2018-04-20 MED ORDER — FUROSEMIDE 40 MG PO TABS
60.0000 mg | ORAL_TABLET | Freq: Every day | ORAL | Status: DC
  Administered 2018-04-20 – 2018-05-02 (×13): 60 mg via ORAL
  Filled 2018-04-20 (×14): qty 1

## 2018-04-20 MED ORDER — BUPROPION HCL ER (SR) 150 MG PO TB12
150.0000 mg | ORAL_TABLET | Freq: Two times a day (BID) | ORAL | Status: DC
  Administered 2018-04-20 – 2018-05-02 (×24): 150 mg via ORAL
  Filled 2018-04-20 (×25): qty 1

## 2018-04-20 MED ORDER — MAGNESIUM OXIDE 400 (241.3 MG) MG PO TABS
800.0000 mg | ORAL_TABLET | Freq: Two times a day (BID) | ORAL | Status: DC
  Administered 2018-04-20: 800 mg via ORAL
  Filled 2018-04-20: qty 2

## 2018-04-20 MED ORDER — METHOCARBAMOL 500 MG PO TABS
500.0000 mg | ORAL_TABLET | Freq: Four times a day (QID) | ORAL | Status: DC | PRN
  Administered 2018-04-21 – 2018-05-01 (×11): 500 mg via ORAL
  Filled 2018-04-20 (×11): qty 1

## 2018-04-20 MED ORDER — SORBITOL 70 % SOLN
30.0000 mL | Freq: Every day | Status: DC | PRN
  Filled 2018-04-20: qty 30

## 2018-04-20 MED ORDER — NITROGLYCERIN 0.4 MG SL SUBL: 0.4000 mg | SUBLINGUAL_TABLET | SUBLINGUAL | Status: DC | PRN

## 2018-04-20 MED ORDER — METOPROLOL SUCCINATE ER 25 MG PO TB24
25.0000 mg | ORAL_TABLET | Freq: Two times a day (BID) | ORAL | Status: DC
  Administered 2018-04-20 – 2018-05-02 (×24): 25 mg via ORAL
  Filled 2018-04-20 (×24): qty 1

## 2018-04-20 MED ORDER — HYDROCODONE-ACETAMINOPHEN 5-325 MG PO TABS: 1.0000 | ORAL_TABLET | ORAL | 0 refills | Status: DC | PRN

## 2018-04-20 NOTE — Discharge Summary (Signed)
Physician Discharge Summary  Judith [name] MHD:[id number] DOB: 02/01/1951 DOA: 04/14/2018  PCP: Rutherford Guys, MD  Admit date: 04/14/2018 Discharge date: 04/20/2018  Admitted From: Home Disposition: CIR  Recommendations for Outpatient Follow-up:  1. Follow up with PCP in 1-2 weeks 2. Please obtain BMP/CBC in one week your next doctors visit.  3. Follow-up with orthopedic Dr Sharol Given to in 1 week   Discharge Condition: Stable CODE STATUS: Full code Diet recommendation: Diabetic  Brief/Interim Summary: 67 year old with a history of osteomyelitis on the right, CKD stage III, coronary artery disease, diabetes mellitus with peripheral neuropathy, paroxysmal atrial fibrillation, sleep apnea came to the hospital with complaints of right-sided foot osteomyelitis.  Patient was admitted to our service due to atrial fibrillation with RVR and surgery had to be held off.  Rate was controlled and patient underwent right-sided transtibial amputation by Dr. Sharol Given on 04/15/2018.  Following this patient developed melanotic stool especially when being on anticoagulation.  Underwent endoscopy on 04/16/18 which showed duodenal ulcer, mild esophagitis.  Patient required transfusion.  Hemodynamically not remained stable.  During the hospitalization she was also noted to be fluid overloaded therefore required diuresis.  Today she is doing better but still requires close supervision from a physician as she is getting physical therapy She needs to follow outpatient with orthopedic in 1 week and her primary care provider as well.  Otherwise she is reached max of benefit from hospital stay and stable for discharge.    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  Appreciate orthopedic input.  PT/OT recommending CIR, pain control as needed -In my opinion patient requires significant assistance and especially physician supervision initially while getting rehabilitation in the setting of at the risk of blood loss from the GI sources of the surgical site.  She is currently on anticoagulation for atrial fibrillation.  Paroxysmal atrial fibrillation with rapid ventricular response, improved - Off Cardizem drip doing well.  Continue Eliquis.  Acute blood loss anemia in the setting of upper GI bleed -Underwent endoscopy on 12/8.  Showed esophagitis with superficial duodenal ulcer.  Currently on PPI.  Avoid NSAIDs.  Acute hypoxic respiratory failure likely from fluid overload Acute on chronic diastolic congestive heart failure, class III, significantly improved -Patient has improved significantly with gentle diuresis.  Last echocardiogram about a year ago showed ejection fraction about 55% with grade 2 diastolic dysfunction. -Discharge the patient.  May require Lasix as needed.  Insulin-dependent diabetes mellitus type 2 -Due to episodes of hypoglycemia, will discontinue Lantus for now.  Continue insulin sliding scale as needed  Essential hypertension -Resume home blood pressure medicines as appropriate  History of depression and anxiety -Resume home meds  Coronary artery disease -Currently chest pain-free.  Continue statin and  beta-blocker  Obstructive sleep apnea -On CPAP  Hyperthyroidism -Continue methimazole  On Eliquis during the hospitalization She is full code Discharged to [facility]  Discharge Instructions   Allergies as of 04/20/2018      Reactions   Contrast Media [iodinated Diagnostic Agents] Hives, Other (See Comments)   Spoke to patient, Iodine allergy is really IV contrast allergy.    Novolog [insulin Aspart] Shortness Of Breath, Other (See Comments)   "breathing problems"   Propofol Shortness Of Breath   "Breathing problems - asthma attack" Can take with benadryl   Codeine Nausea And Vomiting, Other (See Comments)   HIGH DOSES-SEVERE VOMITING   Iodine Other (See Comments)   MUST HAVE BENADRYL PRIOR TO PROCEDURE AND RIGHT BEFORE TREATMENT TO COUNTERACT REACTION-BLISTERING REACTION DERMATOLOGICAL   Penicillins Itching, Rash, Other (See Comments)   Has patient had a PCN reaction causing immediate rash, facial/tongue/throat swelling, SOB or lightheadedness with hypotension: no Has patient had a PCN reaction causing severe rash involving mucus membranes or skin necrosis: No Has patient had a PCN reaction that required hospitalization No Has patient had a PCN reaction occurring within the last 10 years: No If all of the above answers are "NO", then may proceed with Cephalosporin use. CHEST SIZED RASH AND ITCHING   Ace Inhibitors Cough   Demerol [meperidine] Nausea And Vomiting   Dilaudid [hydromorphone Hcl] Other (See Comments)   HEADACHE    Neosporin [neomycin-bacitracin Zn-polymyx] Itching, Rash, Other (See Comments)   MAKES REACTIONS WORSE WHEN USING AS PROPHYLACTIC   Percocet [oxycodone-acetaminophen] Rash   Tape Itching, Rash      Medication List    TAKE these medications   albuterol 108 (90 Base) MCG/ACT inhaler Commonly known as:  PROVENTIL HFA Inhale 1-2 puffs into the lungs every 6 (six) hours as needed for wheezing or shortness of breath.   albuterol (2.5 MG/3ML) 0.083%  nebulizer solution Commonly known as:  PROVENTIL Take 3 mLs (2.5 mg total) by nebulization every 6 (six) hours as needed for wheezing or shortness of breath.   amLODipine 5 MG tablet Commonly known as:  NORVASC Take 1 tablet (5 mg total) by mouth daily.   apixaban 5 MG Tabs tablet Commonly known as:  ELIQUIS Take 1 tablet (5 mg total) by mouth 2 (two) times daily.   atorvastatin 20 MG tablet Commonly known as:  LIPITOR Take 1 tablet (20 mg total) by mouth daily. What changed:  when to take this   B-D SINGLE USE SWABS REGULAR Pads 1 each by Does not apply route 2 (two) times daily.   buPROPion 150 MG 12 hr tablet Commonly known as:  WELLBUTRIN SR Take 150 mg by mouth 2 (two) times daily.   fenofibrate 145 MG tablet Commonly known as:  TRICOR Take 1 tablet (145 mg total) by mouth daily. What changed:  when to take this   FLUoxetine 20 MG capsule Commonly known as:  PROZAC Take 1 capsule (20 mg total) by mouth at bedtime.   furosemide 20 MG tablet Commonly known as:  LASIX Take 3 tablets (60 mg total) by mouth daily.   gabapentin 300 MG capsule Commonly known as:  NEURONTIN Take 3 capsules (900 mg total) by mouth 2 (two) times daily. TAKE 3 CAPSULES BY MOUTH EVERY  MORNING AND 3 CAPSULES AT BEDTIME What changed:  additional instructions   glucose blood test strip Commonly known as:  TRUE METRIX BLOOD GLUCOSE TEST Used to check blood sugars 2x daily.   HYDROcodone-acetaminophen 5-325 MG tablet Commonly known as:  NORCO/VICODIN Take 1-2 tablets by mouth every 4 (four) hours as needed for moderate pain (pain score 4-6).   insulin degludec 100 UNIT/ML Sopn FlexTouch Pen Commonly known as:  TRESIBA FLEXTOUCH Inject 0.15 mLs (15 Units total) into the skin daily. What changed:    how much to take  when to take this   Insulin Pen Needle 32G X 4 MM Misc Use to inject insulin daily   INSULIN SYRINGE .5CC/31GX5/16" 31G X 5/16" 0.5 ML Misc Use to inject insulin    metFORMIN 500 MG 24 hr tablet Commonly known as:  GLUCOPHAGE-XR Take 4 tablets (2,000 mg total) by mouth daily with breakfast. What changed:    how much to take  when to take this   methimazole 10 MG tablet Commonly known as:  TAPAZOLE Take 1 tablet (10 mg total) by mouth daily.   methocarbamol 500 MG tablet Commonly known as:  ROBAXIN Take 1 tablet (500 mg total) by mouth every 6 (six) hours as needed for muscle spasms.   metoprolol succinate 25 MG 24 hr tablet Commonly known as:  TOPROL-XL Take 1 tablet (25 mg total) by mouth 2 (two) times daily. Take with or immediately following a meal.   nitroGLYCERIN 0.4 MG SL tablet Commonly known as:  NITROSTAT Place 1 tablet (0.4 mg total) under the tongue every 5 (five) minutes as needed for chest pain.   ondansetron 8 MG disintegrating tablet Commonly known as:  ZOFRAN-ODT Take 1 tablet (8 mg total) by mouth every 8 (eight) hours as needed for nausea or vomiting.   pantoprazole 40 MG tablet Commonly known as:  PROTONIX Take 1 tablet (40 mg total) by mouth daily.   potassium chloride SA 20 MEQ tablet Commonly known as:  K-DUR,KLOR-CON Take 1 tablet (20 mEq total) by mouth daily.   sotalol 120 MG tablet Commonly known as:  BETAPACE Take 1 tablet (120 mg total) by mouth every 12 (twelve) hours.   traMADol 50 MG tablet Commonly known as:  ULTRAM Take 50 mg by mouth every 8 (eight) hours as needed for moderate pain or severe pain.   triamcinolone cream 0.1 % Commonly known as:  KENALOG Apply 1 application topically 2 (two) times daily. What changed:    when to take this  reasons to take this   TRUE METRIX AIR GLUCOSE METER w/Device Kit 1 each by Does not apply route daily.   TRUE METRIX LEVEL 1 Low Soln 1 each by In Vitro route as needed.   TRUEPLUS LANCETS 28G Misc 1 each by Does not apply route 2 (two) times daily.      Follow-up Information    [name], MD In 1 week.   Specialty:  Orthopedic  Surgery Contact information: [place] [address] [phone]        Rutherford Guys, MD. Schedule an appointment as soon as possible for a visit in 1 week(s).   Specialty:  Family Medicine Contact information: [address] [place] [address] [phone]        Sueanne Margarita, MD .   Specialty:  Cardiology Contact information: [phone] [place] [address] [phone]          Allergies  Allergen Reactions  . Contrast Media [Iodinated Diagnostic Agents] Hives and Other (See Comments)    Spoke to patient, Iodine allergy is really IV contrast allergy.   [name] [Insulin Aspart] Shortness Of Breath and Other (See Comments)    "breathing  problems"  . Propofol Shortness Of Breath    "Breathing problems - asthma attack" Can take with benadryl   . Codeine Nausea And Vomiting and Other (See Comments)    HIGH DOSES-SEVERE VOMITING  . Iodine Other (See Comments)    MUST HAVE BENADRYL PRIOR TO PROCEDURE AND RIGHT BEFORE TREATMENT TO COUNTERACT REACTION-BLISTERING REACTION DERMATOLOGICAL  . Penicillins Itching, Rash and Other (See Comments)    Has patient had a PCN reaction causing immediate rash, facial/tongue/throat swelling, SOB or lightheadedness with hypotension: no Has patient had a PCN reaction causing severe rash involving mucus membranes or skin necrosis: No Has patient had a PCN reaction that required hospitalization No Has patient had a PCN reaction occurring within the last 10 years: No If all of the above answers are "NO", then may proceed with Cephalosporin use.  CHEST SIZED RASH AND ITCHING   . Ace Inhibitors Cough  . Demerol [Meperidine] Nausea And Vomiting  . Dilaudid [Hydromorphone Hcl] Other (See Comments)    HEADACHE   . Neosporin [Neomycin-Bacitracin Zn-Polymyx] Itching, Rash and Other (See Comments)    MAKES REACTIONS WORSE WHEN USING AS PROPHYLACTIC  . Percocet [Oxycodone-Acetaminophen] Rash   . Tape Itching and Rash    You were cared for by a hospitalist during your hospital stay. If you have any questions about your discharge medications or the care you received while you were in the hospital after you are discharged, you can call the unit and asked to speak with the hospitalist on call if the hospitalist that took care of you is not available. Once you are discharged, your primary care physician will handle any further medical issues. Please note that no refills for any discharge medications will be authorized once you are discharged, as it is imperative that you return to your primary care physician (or establish a relationship with a primary care physician if you do not have one) for your aftercare needs so that they can reassess your need for medications and monitor your lab values.  Consultations:  Orthopedic  Gastroenterology   Procedures/Studies: Mr Shoulder Right Wo Contrast  Result Date: 03/24/2018 CLINICAL DATA:  Chronic right shoulder pain. Rotator cuff repair 20 years ago. EXAM: MRI OF THE RIGHT SHOULDER WITHOUT CONTRAST TECHNIQUE: Multiplanar, multisequence MR imaging of the shoulder was performed. No intravenous contrast was administered. COMPARISON:  None. FINDINGS: Despite efforts by the technologist and patient, severe motion artifact is present on today's exam and could not be eliminated. Numerous series were repeated, but were also affected by motion artifact. This reduces exam sensitivity and specificity. Rotator cuff: Full-thickness full width tear of the supraspinatus tendon, the tendon is retracted up to 2.5 cm Full-thickness, full width tear of the infraspinatus tendon retracted 2.0 cm. Suspected small ossifications in the infraspinatus tendon, image 11/8. Mild subscapularis and teres minor tendinopathy. Muscles:  Mild infraspinatus and teres minor atrophy Biceps long head:  Mild tendinopathy of the intra-articular segment. Acromioclavicular Joint: Moderate  spurring. Fluid signal in the joint. Small degenerative subcortical cystic lesions. There is a linear low T1 signal and high T2 signal band in the acromion potentially from a small os acromiale or a healing acromial fracture. Type I acromion. As expected there is a small amount of fluid in the subacromial subdeltoid bursa. Glenohumeral Joint: Mild spurring of the humeral head. No overt joint effusion. Labrum:  Indeterminate due to motion artifact. Bones:  Unremarkable Other: No supplemental non-categorized findings. IMPRESSION: 1. Full-thickness full width tear of the supraspinatus tendon retracted up to  2.5 cm. 2. Full-thickness full width tear of the infraspinatus tendon retracted 2.0 cm. 3. Mild subscapularis and teres minor tendinopathy. 4. Mild infraspinatus and teres minor atrophy. The infraspinatus atrophy could well be due to the rotator cuff rupture. Teres minor atrophy is of less certain cause, but could be due to low-level chronic quadrilateral space syndrome. 5. Moderate degenerative AC joint arthropathy. 6. There is a small band of low T1 and accentuated T2 signal traversing the acromion and best seen for example on image 12/8. This would be an unusual appearance for os acromiale although could conceivably be an anterior os acromiale. This could also be an acromial healing fracture or stress fracture. 7. Despite efforts by the technologist and patient, motion artifact is present on today's exam and could not be eliminated. This reduces exam sensitivity and specificity. Electronically Signed   By: Van Clines M.D.   On: 03/24/2018 10:54   Dg Chest Port 1 View  Result Date: 04/17/2018 CLINICAL DATA:  Increasing shortness of breath over the past several days. History of COPD, previous MI with stent placement, atrial fibrillation. Former smoker. EXAM: PORTABLE CHEST 1 VIEW COMPARISON:  Portable chest x-ray of January 05, 2018 FINDINGS: The lungs are adequately inflated. The interstitial markings  have increased further. The pulmonary vascularity is engorged and the cardiac silhouette enlarged. The trachea is midline. There is no pleural effusion, pneumothorax, or alveolar infiltrate. There is calcification in the wall of the aortic arch. The bony thorax exhibits no acute abnormality. IMPRESSION: Mild to moderate interstitial edema consistent with CHF which has worsened since the previous study. Thoracic aortic atherosclerosis. Electronically Signed   By: David  Martinique M.D.   On: 04/17/2018 09:48     Subjective: Feels better, no complaints today.  General = no fevers, chills, dizziness, malaise, fatigue HEENT/EYES = negative for pain, redness, loss of vision, double vision, blurred vision, loss of hearing, sore throat, hoarseness, dysphagia Cardiovascular= negative for chest pain, palpitation, murmurs, lower extremity swelling Respiratory/lungs= negative for shortness of breath, cough, hemoptysis, wheezing, mucus production Gastrointestinal= negative for nausea, vomiting,, abdominal pain, melena, hematemesis Genitourinary= negative for Dysuria, Hematuria, Change in Urinary Frequency MSK = Negative for arthralgia, myalgias, Back Pain, Joint swelling  Neurology= Negative for headache, seizures, numbness, tingling  Psychiatry= Negative for anxiety, depression, suicidal and homocidal ideation Allergy/Immunology= Medication/Food allergy as listed  Skin= Negative for Rash, lesions, ulcers, itching    Discharge Exam: Vitals:   04/20/18 0956 04/20/18 1142  BP: (!) 148/88 (!) 146/76  Pulse: (!) 52 (!) 56  Resp:  15  Temp:  98.2 F (36.8 C)  SpO2:  98%   Vitals:   04/20/18 0403 04/20/18 0748 04/20/18 0956 04/20/18 1142  BP: (!) 151/72 (!) 117/97 (!) 148/88 (!) 146/76  Pulse: (!) 103 (!) 51 (!) 52 (!) 56  Resp:  (!) 22  15  Temp: 97.8 F (36.6 C) 98 F (36.7 C)  98.2 F (36.8 C)  TempSrc: Oral Oral  Oral  SpO2: 100% 100%  98%  Weight:      Height:        General: Pt is alert,  awake, not in acute distress Cardiovascular: RRR, S1/S2 +, no rubs, no gallops Respiratory: CTA bilaterally, no wheezing, no rhonchi Abdominal: Soft, NT, ND, bowel sounds + Extremities: no edema, no cyanosis Right lower extremity amputation noted.  No obvious drainage or bleeding noted.   The results of significant diagnostics from this hospitalization (including imaging, microbiology, ancillary and laboratory) are listed below for  reference.     Microbiology: Recent Results (from the past 240 hour(s))  WOUND CULTURE     Status: Abnormal   Collection Time: 04/13/18 10:39 AM  Result Value Ref Range Status   MICRO NUMBER: [id number]  Final   SPECIMEN QUALITY: Adequate  Final   SOURCE: WOUND (SITE NOT SPECIFIED)  Final   STATUS: FINAL  Final   GRAM STAIN:   Final    Few Polymorphonuclear leukocytes Many Gram positive cocci in clusters   ISOLATE 1: Staphylococcus aureus (A)  Final    Comment: Heavy growth of Staphylococcus aureus      Susceptibility   Staphylococcus aureus - AEROBIC CULT, GRAM STAIN POSITIVE 1    VANCOMYCIN 1 Sensitive     CIPROFLOXACIN <=0.5 Sensitive     CLINDAMYCIN <=0.25 Sensitive     LEVOFLOXACIN 0.25 Sensitive     ERYTHROMYCIN <=0.25 Sensitive     GENTAMICIN <=0.5 Sensitive     OXACILLIN* 0.5 Sensitive      * Oxacillin-susceptible staphylococci aresusceptible to other penicillinase-stablepenicillins (e.g. Methicillin, Nafcillin), beta-lactam/beta-lactamase inhibitor combinations, andcephems with staphylococcal indications, includingCefazolin.    TETRACYCLINE <=1 Sensitive     TRIMETH/SULFA* <=10 Sensitive      * Oxacillin-susceptible staphylococci aresusceptible to other penicillinase-stablepenicillins (e.g. Methicillin, Nafcillin), beta-lactam/beta-lactamase inhibitor combinations, andcephems with staphylococcal indications, includingCefazolin.Legend:S = Susceptible  I = IntermediateR = Resistant  NS = Not susceptible* = Not tested  NR = Not reported**NN = See  antimicrobic comments  MRSA PCR Screening     Status: None   Collection Time: 04/14/18  2:49 PM  Result Value Ref Range Status   MRSA by PCR NEGATIVE NEGATIVE Final    Comment:        The GeneXpert MRSA Assay (FDA approved for NASAL specimens only), is one component of a comprehensive MRSA colonization surveillance program. It is not intended to diagnose MRSA infection nor to guide or monitor treatment for MRSA infections. Performed at [facility], [place] [address], [place], [place] [address]   Surgical pcr screen     Status: None   Collection Time: 04/14/18  2:49 PM  Result Value Ref Range Status   MRSA, PCR NEGATIVE NEGATIVE Final   Staphylococcus aureus NEGATIVE NEGATIVE Final    Comment: (NOTE) The Xpert SA Assay (FDA approved for NASAL specimens in patients 62 years of age and older), is one component of a comprehensive surveillance program. It is not intended to diagnose infection nor to guide or monitor treatment. Performed at [facility], [place] [address], [place], [place] [address]      Labs: BNP (last 3 results) No results for input(s): BNP in the last 8760 hours. Basic Metabolic Panel: Recent Labs  Lab 04/16/18 0057 04/17/18 0742 04/18/18 0230 04/19/18 0323 04/20/18 0233  NA 131* 137 136 137 136  K 3.9 4.0 4.3 4.2 4.3  CL 98 100 101 101 99  CO2 20* _0 GLUCOSE 132* 94 91 65* 211*  BUN 31* 27* 30* 24* 21  CREATININE 1.79* 1.75* 1.61* 1.43* 1.40*  CALCIUM 7.6* 8.1* 8.1* 8.6* 8.7*  MG  --   --   --   --  1.3*   Liver Function Tests: No results for input(s): AST, ALT, ALKPHOS, BILITOT, PROT, ALBUMIN in the last 168 hours. No results for input(s): LIPASE, AMYLASE in the last 168 hours. No results for input(s): AMMONIA in the last 168 hours. CBC: Recent Labs  Lab 04/15/18 0252 04/15/18 1902 04/16/18 0057 04/17/18  8563 04/18/18 0230 04/19/18 0323  WBC 7.1  --  6.8 8.8 10.4 9.7  HGB 7.5* 7.9* 7.7* 9.8* 9.0* 9.2*  HCT 23.7*  25.1* 24.6* 31.8* 28.8* 29.5*  MCV 87.1  --  85.7 86.9 87.0 87.5  PLT 323  --  294 387 381 465*   Cardiac Enzymes: No results for input(s): CKTOTAL, CKMB, CKMBINDEX, TROPONINI in the last 168 hours. BNP: Invalid input(s): POCBNP CBG: Recent Labs  Lab 04/19/18 1159 04/19/18 1633 04/19/18 2126 04/20/18 0746 04/20/18 1142  GLUCAP 117* 123* 190* 125* 126*   D-Dimer No results for input(s): DDIMER in the last 72 hours. Hgb A1c No results for input(s): HGBA1C in the last 72 hours. Lipid Profile No results for input(s): CHOL, HDL, LDLCALC, TRIG, CHOLHDL, LDLDIRECT in the last 72 hours. Thyroid function studies No results for input(s): TSH, T4TOTAL, T3FREE, THYROIDAB in the last 72 hours.  Invalid input(s): FREET3 Anemia work up No results for input(s): VITAMINB12, FOLATE, FERRITIN, TIBC, IRON, RETICCTPCT in the last 72 hours. Urinalysis    Component Value Date/Time   COLORURINE AMBER (A) 01/04/2018 2244   APPEARANCEUR CLOUDY (A) 01/04/2018 2244   LABSPEC 1.025 01/04/2018 2244   PHURINE 5.0 01/04/2018 2244   GLUCOSEU 50 (A) 01/04/2018 2244   HGBUR SMALL (A) 01/04/2018 2244   BILIRUBINUR NEGATIVE 01/04/2018 2244   BILIRUBINUR negative 07/22/2017 0858   BILIRUBINUR Small 12/06/2014 1441   KETONESUR 5 (A) 01/04/2018 2244   PROTEINUR 100 (A) 01/04/2018 2244   UROBILINOGEN 0.2 07/22/2017 0858   UROBILINOGEN 1.0 02/12/2015 1238   NITRITE NEGATIVE 01/04/2018 2244   LEUKOCYTESUR NEGATIVE 01/04/2018 2244   Sepsis Labs Invalid input(s): PROCALCITONIN,  WBC,  LACTICIDVEN Microbiology Recent Results (from the past 240 hour(s))  WOUND CULTURE     Status: Abnormal   Collection Time: 04/13/18 10:39 AM  Result Value Ref Range Status   MICRO NUMBER: [id number]  Final   SPECIMEN QUALITY: Adequate  Final   SOURCE: WOUND (SITE NOT SPECIFIED)  Final   STATUS: FINAL  Final   GRAM STAIN:   Final    Few Polymorphonuclear leukocytes Many Gram positive cocci in clusters   ISOLATE 1:  Staphylococcus aureus (A)  Final    Comment: Heavy growth of Staphylococcus aureus      Susceptibility   Staphylococcus aureus - AEROBIC CULT, GRAM STAIN POSITIVE 1    VANCOMYCIN 1 Sensitive     CIPROFLOXACIN <=0.5 Sensitive     CLINDAMYCIN <=0.25 Sensitive     LEVOFLOXACIN 0.25 Sensitive     ERYTHROMYCIN <=0.25 Sensitive     GENTAMICIN <=0.5 Sensitive     OXACILLIN* 0.5 Sensitive      * Oxacillin-susceptible staphylococci aresusceptible to other penicillinase-stablepenicillins (e.g. Methicillin, Nafcillin), beta-lactam/beta-lactamase inhibitor combinations, andcephems with staphylococcal indications, includingCefazolin.    TETRACYCLINE <=1 Sensitive     TRIMETH/SULFA* <=10 Sensitive      * Oxacillin-susceptible staphylococci aresusceptible to other penicillinase-stablepenicillins (e.g. Methicillin, Nafcillin), beta-lactam/beta-lactamase inhibitor combinations, andcephems with staphylococcal indications, includingCefazolin.Legend:S = Susceptible  I = IntermediateR = Resistant  NS = Not susceptible* = Not tested  NR = Not reported**NN = See antimicrobic comments  MRSA PCR Screening     Status: None   Collection Time: 04/14/18  2:49 PM  Result Value Ref Range Status   MRSA by PCR NEGATIVE NEGATIVE Final    Comment:        The GeneXpert MRSA Assay (FDA approved for NASAL specimens only), is one component of a comprehensive MRSA colonization surveillance program. It is  not intended to diagnose MRSA infection nor to guide or monitor treatment for MRSA infections. Performed at [facility], [place] [address] [place], [place] [address]   Surgical pcr screen     Status: None   Collection Time: 04/14/18  2:49 PM  Result Value Ref Range Status   MRSA, PCR NEGATIVE NEGATIVE Final   Staphylococcus aureus NEGATIVE NEGATIVE Final    Comment: (NOTE) The Xpert SA Assay (FDA approved for NASAL specimens in patients 16 years of age and older), is one component of a comprehensive surveillance  program. It is not intended to diagnose infection nor to guide or monitor treatment. Performed at [facility], [place] [address] [place],  [address]      Time coordinating discharge:  I have spent 35 minutes face to face with the patient and on the ward discussing the patients care, assessment, plan and disposition with other care givers. >50% of the time was devoted counseling the patient about the risks and benefits of treatment/Discharge disposition and coordinating care.   SIGNED:   Damita Lack, MD  [facility] 04/20/2018, 3:16 PM Pager   If 7PM-7AM, please contact night-coverage [email or web address] Password [email or web address]

## 2018-04-20 NOTE — Progress Notes (Signed)
Inpatient Rehabilitation-Admissions Coordinator   Pt's initial denial has been overturned through peer to peer with Dr. Reesa Chew. Pt is now approved for CIR. AC has received medical approval for admit to CIR today.  AC has updated pt, CM, and RN. Please call if questions.

## 2018-04-20 NOTE — H&P (Signed)
Physical Medicine and Rehabilitation Admission H&P       HPI: Judith [name] is a 67 year old right-handed female with history of sleep apnea/CPAP,diastolic congestive heart failure, CAD with stenting, diabetes mellitus with peripheral neuropathy, CKD stage III,hypertension, remote tobacco abuse 3 years ago, PAF maintained on Eliquis, PVD with history of right toe amputations. Per chart review and patient, patient lives with her sister. One level home with ramped entrance. There is a laundry in the basement. Patient reportedly independent prior to admission using a walker but sometimes needed a wheelchair. Her sister can provide assistance as needed. Presented 04/14/2018 with ulcer of the right foot with ischemic changes and no relief with conservative care. 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 Sharol Given. Wound VAC applied. Hospital course pain management. Gastroenterology consulted Dr. Henrene Pastor for anemia with black tarry stools. Hemoglobin decreased to 7.5 she was transfused. A recent CT of the abdomen September 2019 for nausea vomiting unremarkable. Gastric emptying scan 02/14/2018 normal study. Endoscopy completed 04/16/2018 significant for ulcerated esophagitis and superficial ulcer. Advise continue PPI.  Acute hypoxic respiratory failure secondary to volume overload evident by chest x-ray she was diuresed.Chronic Eliquis has been resumed. Therapy evaluations completed with recommendations of physical medicine rehabilitation consult.Patient was admitted for a comprehensive rehabilitation program   Review of Systems  Constitutional: Negative for chills and fever.  HENT: Negative for hearing loss.   Eyes: Negative for blurred vision and double vision.  Respiratory: Negative for cough and shortness of breath.   Cardiovascular: Positive for leg swelling. Negative for chest pain and palpitations.    Gastrointestinal: Positive for blood in stool, constipation, nausea and vomiting.       GERD   Musculoskeletal: Positive for joint pain and myalgias. Negative for back pain.  Skin: Negative for rash.  Neurological: Positive for tremors, sensory change and headaches.  All other systems reviewed and are negative.       Past Medical History:  Diagnosis Date  . Abnormal EKG 07/31/2013  . Arthritis      "hands" (03/06/2015)  . Asthma    . Carpal tunnel syndrome, bilateral    . Chronic kidney disease    . Complication of anesthesia      slow to wake up. Pt sts she woke up during surgery many years ago.  . Coronary artery disease       2 v CAD with CTO of the RCA and high grade bifurcational LCx/OM stenosis. S/P PCI DES x 2 to the LCx/OM.   Marland Kitchen Diabetic peripheral neuropathy ([place]) "since 1996"  . GERD (gastroesophageal reflux disease)    . Goiter    . Headache      migraines prior to menopause  . History of shingles 06/01/2013  . Hyperlipidemia LDL goal <70 10/13/2015  . Hypertension    . Hyperthyroidism    . Osteomyelitis of foot (HCC)      Right  . PAF (paroxysmal atrial fibrillation) ([place]) 04/29/2015    CHADS2VASC score of 5 now on Apixaban  . Pneumonia ~ 1976  . Sleep apnea      Bipap  . Tremors of nervous system    . Type II diabetes mellitus (HCC)      insulin dependent         Past Surgical History:  Procedure Laterality Date  . ABDOMINAL HYSTERECTOMY   1988    age 80; CERVICAL DYSPLASIA; ovaries intact.   . AMPUTATION Right 01/23/2016    Procedure: Right 3rd Ray Amputation;  Surgeon: Illene Regulus  Sharol Given, MD;  Location: [place];  Service: Orthopedics;  Laterality: Right;  . AMPUTATION Right 02/13/2016    Procedure: Right Transmetatarsal Amputation;  Surgeon: [name], MD;  Location: [place];  Service: Orthopedics;  Laterality: Right;  . AMPUTATION Right 04/15/2018    Procedure: AMPUTATION BELOW KNEE;  Surgeon: [name], MD;  Location: [place];  Service: Orthopedics;  Laterality: Right;  . BIOPSY   04/16/2018    Procedure: BIOPSY;  Surgeon: Irene Shipper, MD;  Location:  [facility] ENDOSCOPY;  Service: Endoscopy;;  . CARDIAC CATHETERIZATION N/A 02/27/2015    Procedure: Left Heart Cath and Coronary Angiography;  Surgeon: [name], MD; LAD 40%, mCFX 80%, OM 70%, RCA 100% calcified       . CARDIAC CATHETERIZATION N/A 03/06/2015    Procedure: Coronary Stent Intervention;  Surgeon: [name], MD;  Location: [place] [facility];  Service: Cardiovascular;  Laterality: N/A;  Mid CX 3.50x12 promus DES w/ 0% resdual and Prox OM1 2.50x20 promus DES w/ 20% residual  . CARDIOVERSION      . CARPAL TUNNEL RELEASE Right Nov 2015  . CARPAL TUNNEL RELEASE Right 1992; 05/2014    [name]; [place]  . CESAREAN SECTION   1982; 1984  . ESOPHAGOGASTRODUODENOSCOPY (EGD) WITH PROPOFOL N/A 04/16/2018    Procedure: ESOPHAGOGASTRODUODENOSCOPY (EGD) WITH PROPOFOL;  Surgeon: Irene Shipper, MD;  Location: [facility] ENDOSCOPY;  Service: Endoscopy;  Laterality: N/A;  . FOOT NEUROMA SURGERY Bilateral 2000  . I&D EXTREMITY Left 01/06/2018    Procedure: DEBRIDEMENT ULCER LEFT FOOT;  Surgeon: [name], MD;  Location: [place];  Service: Orthopedics;  Laterality: Left;  . KNEE ARTHROSCOPY Right ~ 2003    "meniscus repair"  . SHOULDER OPEN ROTATOR CUFF REPAIR Right 1996; 1998    "w/fracture repair"  . THYROID SURGERY   2000    "removed lots of nodules"  . TONSILLECTOMY   1976         Family History  Problem Relation Age of Onset  . Cancer Mother 53        bronchial cancer  . Breast cancer Mother    . Lung cancer Mother    . Hypertension Father    . COPD Father    . Heart disease Father 72        CAD with cardiac stenting  . Heart attack Father    . Parkinson's disease Father    . Allergies Sister    . Breast cancer Maternal Grandmother    . Emphysema Maternal Grandfather    . Leukemia Paternal Grandmother    . Emphysema Paternal Grandfather    . Thyroid disease Neg Hx      Social History:  reports that she quit smoking about 3 years ago. Her smoking use included cigarettes. She  smoked 0.00 packs per day for 41.00 years. She has never used smokeless tobacco. She reports that she does not drink alcohol or use drugs. Allergies:       Allergies  Allergen Reactions  . Contrast Media [Iodinated Diagnostic Agents] Hives and Other (See Comments)      Spoke to patient, Iodine allergy is really IV contrast allergy.   [name] [Insulin Aspart] Shortness Of Breath and Other (See Comments)      "breathing problems"  . Propofol Shortness Of Breath      "Breathing problems - asthma attack" Can take with benadryl    . Codeine Nausea And Vomiting and Other (See Comments)  HIGH DOSES-SEVERE VOMITING  . Iodine Other (See Comments)      MUST HAVE BENADRYL PRIOR TO PROCEDURE AND RIGHT BEFORE TREATMENT TO COUNTERACT REACTION-BLISTERING REACTION DERMATOLOGICAL  . Penicillins Itching, Rash and Other (See Comments)      Has patient had a PCN reaction causing immediate rash, facial/tongue/throat swelling, SOB or lightheadedness with hypotension: no Has patient had a PCN reaction causing severe rash involving mucus membranes or skin necrosis: No Has patient had a PCN reaction that required hospitalization No Has patient had a PCN reaction occurring within the last 10 years: No If all of the above answers are "NO", then may proceed with Cephalosporin use.   CHEST SIZED RASH AND ITCHING    . Ace Inhibitors Cough  . Demerol [Meperidine] Nausea And Vomiting  . Dilaudid [Hydromorphone Hcl] Other (See Comments)      HEADACHE   . Neosporin [Neomycin-Bacitracin Zn-Polymyx] Itching, Rash and Other (See Comments)      MAKES REACTIONS WORSE WHEN USING AS PROPHYLACTIC  . Percocet [Oxycodone-Acetaminophen] Rash  . Tape Itching and Rash          Medications Prior to Admission  Medication Sig Dispense Refill  . Alcohol Swabs (B-D SINGLE USE SWABS REGULAR) PADS 1 each by Does not apply route 2 (two) times daily. 100 each 2  . amLODipine (NORVASC) 5 MG tablet Take 1 tablet (5 mg total) by  mouth daily. 30 tablet 0  . apixaban (ELIQUIS) 5 MG TABS tablet Take 1 tablet (5 mg total) by mouth 2 (two) times daily. 180 tablet 3  . atorvastatin (LIPITOR) 20 MG tablet Take 1 tablet (20 mg total) by mouth daily. (Patient taking differently: Take 20 mg by mouth every evening. ) 90 tablet 3  . Blood Glucose Calibration (TRUE METRIX LEVEL 1) Low SOLN 1 each by In Vitro route as needed. 1 each 0  . Blood Glucose Monitoring Suppl (TRUE METRIX AIR GLUCOSE METER) w/Device KIT 1 each by Does not apply route daily. 1 kit 0  . buPROPion (WELLBUTRIN SR) 150 MG 12 hr tablet Take 150 mg by mouth 2 (two) times daily.      . fenofibrate (TRICOR) 145 MG tablet Take 1 tablet (145 mg total) by mouth daily. (Patient taking differently: Take 145 mg by mouth every evening. ) 90 tablet 3  . FLUoxetine (PROZAC) 20 MG capsule Take 1 capsule (20 mg total) by mouth at bedtime. 90 capsule 2  . furosemide (LASIX) 20 MG tablet Take 3 tablets (60 mg total) by mouth daily. 273 tablet 3  . gabapentin (NEURONTIN) 300 MG capsule Take 3 capsules (900 mg total) by mouth 2 (two) times daily. TAKE 3 CAPSULES BY MOUTH EVERY  MORNING AND 3 CAPSULES AT BEDTIME (Patient taking differently: Take 900 mg by mouth 2 (two) times daily. ) 540 capsule 0  . glucose blood (TRUE METRIX BLOOD GLUCOSE TEST) test strip Used to check blood sugars 2x daily. 100 each 12  . insulin degludec (TRESIBA FLEXTOUCH) 100 UNIT/ML SOPN FlexTouch Pen Inject 0.15 mLs (15 Units total) into the skin daily. (Patient taking differently: Inject 20 Units into the skin every evening. ) 15 mL 0  . Insulin Pen Needle 32G X 4 MM MISC Use to inject insulin daily 100 each 5  . Insulin Syringe-Needle U-100 (INSULIN SYRINGE .5CC/31GX5/16") 31G X 5/16" 0.5 ML MISC Use to inject insulin 100 each 5  . metFORMIN (GLUCOPHAGE-XR) 500 MG 24 hr tablet Take 4 tablets (2,000 mg total) by mouth daily with breakfast. (  Patient taking differently: Take 1,000 mg by mouth 2 (two) times daily. )  360 tablet 3  . methimazole (TAPAZOLE) 10 MG tablet Take 1 tablet (10 mg total) by mouth daily. 30 tablet 11  . metoprolol succinate (TOPROL-XL) 25 MG 24 hr tablet Take 1 tablet (25 mg total) by mouth 2 (two) times daily. Take with or immediately following a meal. 180 tablet 1  . pantoprazole (PROTONIX) 40 MG tablet Take 1 tablet (40 mg total) by mouth daily. 90 tablet 0  . potassium chloride SA (K-DUR,KLOR-CON) 20 MEQ tablet Take 1 tablet (20 mEq total) by mouth daily.      . sotalol (BETAPACE) 120 MG tablet Take 1 tablet (120 mg total) by mouth every 12 (twelve) hours. 180 tablet 3  . traMADol (ULTRAM) 50 MG tablet Take 50 mg by mouth every 8 (eight) hours as needed for moderate pain or severe pain.    5  . TRUEPLUS LANCETS 28G MISC 1 each by Does not apply route 2 (two) times daily. 100 each 2  . albuterol (PROVENTIL HFA) 108 (90 Base) MCG/ACT inhaler Inhale 1-2 puffs into the lungs every 6 (six) hours as needed for wheezing or shortness of breath. 1 Inhaler 0  . albuterol (PROVENTIL) (2.5 MG/3ML) 0.083% nebulizer solution Take 3 mLs (2.5 mg total) by nebulization every 6 (six) hours as needed for wheezing or shortness of breath. 150 mL 0  . nitroGLYCERIN (NITROSTAT) 0.4 MG SL tablet Place 1 tablet (0.4 mg total) under the tongue every 5 (five) minutes as needed for chest pain. 25 tablet 3  . ondansetron (ZOFRAN-ODT) 8 MG disintegrating tablet Take 1 tablet (8 mg total) by mouth every 8 (eight) hours as needed for nausea or vomiting. 20 tablet 1  . triamcinolone cream (KENALOG) 0.1 % Apply 1 application topically 2 (two) times daily. (Patient taking differently: Apply 1 application topically 2 (two) times daily as needed (for skin rash/irritation.). ) 30 g 0      Drug Regimen Review Drug regimen was reviewed and remains appropriate with no significant issues identified   Home: Home Living Family/patient expects to be discharged to:: Private residence Living Arrangements: Other  relatives(sister) Available Help at Discharge: Family, Available PRN/intermittently Type of Home: House Home Access: [place]: Two level, Laundry or work area in basement [employer] Shower/Tub: Multimedia programmer: [place]: Environmental consultant - 2 wheels, Wheelchair - manual, Other (comment), Shower seat, Grab bars - toilet, Grab bars - tub/shower, Hand held shower head   Functional History: Prior Function Level of Independence: Needs assistance Gait / Transfers Assistance Needed: walking with a RW ADL's / Homemaking Assistance Needed: assisted for IADL, one week prior to admission she still had an aide who assisted with showering Comments: reports independence with mobility and ADL's within last month    Functional Status:  Mobility: Bed Mobility Overal bed mobility: Needs Assistance Bed Mobility: Supine to Sit Supine to sit: Min assist General bed mobility comments: min assist to elevate trunk to uprigh tat EOB Transfers Overall transfer level: Needs assistance Equipment used: Rolling walker (2 wheeled) Transfers: Squat Pivot Transfers Sit to Stand: +2 physical assistance, Mod assist Squat pivot transfers: Min assist General transfer comment: min assist for power up and positioning, perfomed x3 during session with education for chair position and UE placement. patient using countdown method and rocker technique to build moment prior to transition   ADL: ADL Overall ADL's : Needs assistance/impaired Eating/Feeding: Independent, Sitting Grooming: Wash/dry hands, Dance movement psychotherapist,  Sitting, Set up Upper Body Bathing: Sitting, Minimal assistance Lower Body Bathing: Moderate assistance, Sitting/lateral leans Upper Body Dressing : Minimal assistance, Sitting Lower Body Dressing: Sitting/lateral leans, Moderate assistance Toileting- Clothing Manipulation and Hygiene: Maximal assistance, Sitting/lateral lean Functional mobility during ADLs: (pt unable to  hop in place without buckling)   Cognition: Cognition Overall Cognitive Status: Impaired/Different from baseline Orientation Level: Oriented X4 Cognition Arousal/Alertness: Awake/alert Behavior During Therapy: WFL for tasks assessed/performed Overall Cognitive Status: Impaired/Different from baseline Area of Impairment: Memory, Safety/judgement, Attention Current Attention Level: Selective Memory: Decreased short-term memory Safety/Judgement: Decreased awareness of safety, Decreased awareness of deficits General Comments: pt with memory loss at baseline, has hx of falls out of chair when reaching to pick up item from the floor   Physical Exam: Blood pressure (!) 151/72, pulse (!) 103, temperature 97.8 F (36.6 C), temperature source Oral, resp. rate 19, height 5' 3.5" (1.613 m), weight 72.6 kg, SpO2 100 %. Physical Exam  HENT:  Head: Normocephalic.  Eyes: EOM are normal. Right eye exhibits no discharge. Left eye exhibits no discharge.  Neck: Normal range of motion. Neck supple. No thyromegaly present.  Cardiovascular:  Cardiac rate controlled  Respiratory: Effort normal and breath sounds normal. No respiratory distress.  GI: Soft. Bowel sounds are normal. She exhibits no distension.  Neurological:  Alert/no acute distress. Follows commands. Oriented to person place and time.  Skin:  Wound VAC in place to amputation site   Motor strength is 5/5 bilateral deltoid bicep tricep grip she has good range of motion in the upper limbs. Left lower limb has 5/5 strength in hip flexor knee extensor ankle dorsiflexor.  Normal range of motion hip knee ankle Right lower limb 4/5 and hip flexor she has good knee flexion extension no manual muscle testing performed Station intact in the upper limbs Left foot has healed surgical incision with callus between the second and third met metatarsal heads healed dorsal surgical incision between the second and third metatarsal heads. Left foot has no  evidence of breakdown or lesions.  Skin color is good with positive pedal pulse   Lab Results Last 48 Hours        Results for orders placed or performed during the hospital encounter of 04/14/18 (from the past 48 hour(s))  Glucose, capillary     Status: Abnormal    Collection Time: 04/18/18  8:10 AM  Result Value Ref Range    Glucose-Capillary 57 (L) 70 - 99 mg/dL  Glucose, capillary     Status: Abnormal    Collection Time: 04/18/18 11:54 AM  Result Value Ref Range    Glucose-Capillary 101 (H) 70 - 99 mg/dL  Glucose, capillary     Status: None    Collection Time: 04/18/18  4:45 PM  Result Value Ref Range    Glucose-Capillary 89 70 - 99 mg/dL  Glucose, capillary     Status: None    Collection Time: 04/18/18  9:11 PM  Result Value Ref Range    Glucose-Capillary 77 70 - 99 mg/dL  CBC     Status: Abnormal    Collection Time: 04/19/18  3:23 AM  Result Value Ref Range    WBC 9.7 4.0 - 10.5 K/uL    RBC 3.37 (L) 3.87 - 5.11 MIL/uL    Hemoglobin 9.2 (L) 12.0 - 15.0 g/dL    HCT 29.5 (L) 36.0 - 46.0 %    MCV 87.5 80.0 - 100.0 fL    MCH 27.3 26.0 - 34.0 pg  MCHC 31.2 30.0 - 36.0 g/dL    RDW 16.9 (H) 11.5 - 15.5 %    Platelets 465 (H) 150 - 400 K/uL    nRBC 0.3 (H) 0.0 - 0.2 %      Comment: Performed at [facility] [address], [place], [place] [address]  Basic metabolic panel     Status: Abnormal    Collection Time: 04/19/18  3:23 AM  Result Value Ref Range    Sodium 137 135 - 145 mmol/L    Potassium 4.2 3.5 - 5.1 mmol/L    Chloride 101 98 - 111 mmol/L    CO2 27 22 - 32 mmol/L    Glucose, Bld 65 (L) 70 - 99 mg/dL    BUN 24 (H) 8 - 23 mg/dL    Creatinine, Ser 1.43 (H) 0.44 - 1.00 mg/dL    Calcium 8.6 (L) 8.9 - 10.3 mg/dL    GFR calc non Af Amer 38 (L) >60 mL/min    GFR calc Af Amer 44 (L) >60 mL/min    Anion gap 9 5 - 15      Comment: Performed at [facility] [address], [place], [place] [address]  Glucose, capillary     Status: Abnormal    Collection  Time: 04/19/18  7:53 AM  Result Value Ref Range    Glucose-Capillary 66 (L) 70 - 99 mg/dL  Glucose, capillary     Status: Abnormal    Collection Time: 04/19/18 11:59 AM  Result Value Ref Range    Glucose-Capillary 117 (H) 70 - 99 mg/dL  Glucose, capillary     Status: Abnormal    Collection Time: 04/19/18  4:33 PM  Result Value Ref Range    Glucose-Capillary 123 (H) 70 - 99 mg/dL  Glucose, capillary     Status: Abnormal    Collection Time: 04/19/18  9:26 PM  Result Value Ref Range    Glucose-Capillary 190 (H) 70 - 99 mg/dL  Basic metabolic panel     Status: Abnormal    Collection Time: 04/20/18  2:33 AM  Result Value Ref Range    Sodium 136 135 - 145 mmol/L    Potassium 4.3 3.5 - 5.1 mmol/L    Chloride 99 98 - 111 mmol/L    CO2 28 22 - 32 mmol/L    Glucose, Bld 211 (H) 70 - 99 mg/dL    BUN 21 8 - 23 mg/dL    Creatinine, Ser 1.40 (H) 0.44 - 1.00 mg/dL    Calcium 8.7 (L) 8.9 - 10.3 mg/dL    GFR calc non Af Amer 39 (L) >60 mL/min    GFR calc Af Amer 46 (L) >60 mL/min    Anion gap 9 5 - 15      Comment: Performed at [facility], [place] [address] [place], [place] [address]  Magnesium     Status: Abnormal    Collection Time: 04/20/18  2:33 AM  Result Value Ref Range    Magnesium 1.3 (L) 1.7 - 2.4 mg/dL      Comment: Performed at [facility] [address] [place], [place] [address]      Imaging Results (Last 48 hours)  No results found.           Medical Problem List and Plan: 1.  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  DVT Prophylaxis/Anticoagulation: Eliquis 3. Pain Management:  Neurontin 900 mg twice a  day, hydrocodone and Robaxin as needed 4. Mood:  Prozac 20 mg daily at bedtime, Wellbutrin 150 mg twice a day. Provide emotional support 5. Neuropsych: This patient is capable of making decisions on her own behalf. 6. Skin/Wound Care:  Routine skin checks 7. Fluids/Electrolytes/Nutrition:   Routine in and out's with follow-up chemistries 8. Acute blood loss anemia with black tarry stools. Follow-up CBC 9.  Ulcerated esophagitis with superficial ulcer. Endoscopy complete 04/16/2018. Continue PPI. Follow-up GI as needed 10.  Atrial fibrillation. Cardiac rate controlled. Continue Eliquis/Betapace 120 mg every 12, Toprol 25 mg twice a day 11. Diabetes mellitus with peripheral neuropathy. Hemoglobin A1c 7.9.Lantus 5 units QHS. Check blood sugars before meals and at bedtime. Diabetic teaching 12. Diastolic congestive heart failure. Continue Lasix as directed . Monitor for any signs of fluid overload. 13. CKD III. Follow-up chemistries. Monitor closely while on diuretic 14.CAD with stenting. No chest pain. 15. Hypothyroidism. Tapazole 10 mg daily 16. Hyperlipidemia. Lipitor/fenofibrate.  Post Admission Physician Evaluation: 1. Functional deficits secondary  to RIght transtibial amputation. 2. Patient admitted to receive collaborative, interdisciplinary care between the physiatrist, rehab nursing staff, and therapy team. 3. Patient's level of medical complexity and substantial therapy needs in context of that medical necessity cannot be provided at a lesser intensity of care. 4. Patient has experienced substantial functional loss from his/her baseline.   Judging by the patient's diagnosis, physical exam, and functional history, the patient has potential for functional progress which will result in measurable gains while on inpatient rehab.  These gains will be of substantial and practical use upon discharge in facilitating mobility and self-care at the household level. 5. Physiatrist will provide 24 hour management of medical needs as well as oversight of the therapy plan/treatment and provide guidance as appropriate regarding the interaction of the two. 6. 24 hour rehab nursing will assist in the management of  bladder management, bowel management, safety, skin/wound care, disease management,  medication administration, pain management and patient education  and help integrate therapy concepts, techniques,education, etc. 7. PT will assess and treat for:  . pre gait, gait training, endurance , safety, equipment, neuromuscular re education Goals are: independent with assistive device. 8. OT will assess and treat for  .  ADLs, Cognitive perceptual skills, Neuromuscular re education, safety, endurance, equipmentGoals are: independent with assistive device.  9. SLP will assess and treat for  .  Goals are: N/A. 10. Case Management and Social Worker will assess and treat for psychological issues and discharge planning. 11. Team conference will be held weekly to assess progress toward goals and to determine barriers to discharge. 12.  Patient will receive at least 3 hours of therapy per day at least 5 days per week. 13. ELOS and Prognosis: 10-14d good  "I have personally performed a face to face diagnostic evaluation of this patient.  Additionally, I have reviewed and concur with the physician assistant's documentation above."  Charlett Blake M.D. [facility] FAAPM&R (Sports Med, Neuromuscular Med) Diplomate Am Board of Electrodiagnostic Med    Elizabeth Sauer 04/20/2018

## 2018-04-20 NOTE — Progress Notes (Signed)
Pt arrived to unit via w/c. Pt has personal CPAP at bedside. Bed alarm on and functioning. Will continue to monitor.

## 2018-04-20 NOTE — Progress Notes (Signed)
Pt transferred to 4W rehab. Left in wchair accompanied by rn and nurse tech. Placed in rm, report given to Delia Chimes

## 2018-04-20 NOTE — Progress Notes (Signed)
PMR Admission Coordinator Pre-Admission Assessment  Patient: Judith [name] is an 67 y.o., female MRN: [id number] DOB: 03/24/1951 Height: 5' 3.5" (161.3 cm) Weight: 72.6 kg                                                                                                                                                  Insurance Information HMO:     PPO: Yes     PCP:      IPA:      80/20:      OTHER:  PRIMARY: [employer]      Policy#: [id number]      Subscriber: patient CM Name: Jonna Coup      Phone#: [phone] [id number]     Fax#: [phone] Pre-Cert#: [id number]      Employer:  Josem Kaufmann ([id number]) provided by Jeanette Caprice at [facility] (p: [phone]) on 12/12 for admit to CIR on 12/12. Clinical updates are due in 1 week, by 12/19, to [place]. Benefits:  Phone #: NA     Name: Availity online portal Eff. Date: 06/10/17     Deduct: $500 (met $354.86)      Out of Pocket Max: $6,700 (met $6,700)      Life Max:  CIR: $450/day for days 1-4, $0/day for days 5+      SNF: $0/day for days 1-20, $178/day for days 21-100 (100 day limit) Outpatient: per necessity     Co-Pay: $20-40/visit pending service Home Health: 100%, per necessity      Co-Pay: DME: 80%     Co-Pay: 20% Providers:  SECONDARY: Medicaid [place]      Policy#: [id number]   Coverage Code: MAAQN   Subscriber: Patient CM Name:       Phone#:      Fax#:  Pre-Cert#:       Employer:  Benefits:  Phone #: [phone]     Name: verified online via [employer] on 04/18/18 Eff. Date: verified eligibility on 04/18/18     Deduct:       Out of Pocket Max:       Life Max:  CIR:       SNF:  Outpatient:      Co-Pay:  Home Health:       Co-Pay:  DME:      Co-Pay:   Medicaid Application Date:       Case Manager:  Disability Application Date:       Case Worker:   Emergency Contact Information         Contact Information    Name Relation Home Work Mobile   Scopel,Chaunta Daughter   [phone]   Cox,[name] Sister    [phone]     Current Medical History  Patient Admitting Diagnosis: Right BKA History of Present Illness: Judith [name] is a 67 year old right-handed female with history of sleep apnea/CPAP,diastolic  congestive heart failure, CAD with stenting, diabetes mellitus with peripheral neuropathy, CKD stage III,hypertension, remote tobacco abuse 3 years ago, PAF maintained on Eliquis, PVD with history of right toe amputations. Per chart review and patient, patient lives with her sister. One level home with ramped entrance. There is a laundry in the basement. Patient reportedly independent prior to admission using a walker but sometimes needed a wheelchair. Her sister can provide assistance as needed. Presented 04/14/2018 with ulcer of the right foot with ischemic changes and no relief with conservative care. 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 Sharol Given. Wound VAC applied. Hospital course pain management. Gastroenterology consulted Dr. Henrene Pastor for anemia with black tarry stools. Hemoglobin decreased to 7.5 she was transfused. A recent CT of the abdomen September 2019 for nausea vomiting unremarkable. Gastric emptying scan 02/14/2018 normal study. Endoscopy completed 04/16/2018 significant for ulcerated esophagitis and superficial ulcer. Advise continue PPI. Acute hypoxic respiratory failure secondary to volume overload evident by chest x-ray she was diuresed.Chronic Eliquis has been resumed. Therapy evaluations completed with recommendations of physical medicine rehabilitation consult.Patient is to be admitted for a comprehensive rehabilitation program on 04/20/18.    Past Medical History      Past Medical History:  Diagnosis Date  . Abnormal EKG 07/31/2013  . Arthritis    "hands" (03/06/2015)  . Asthma   . Carpal tunnel syndrome, bilateral   . Chronic kidney disease   . Complication of anesthesia    slow to wake up. Pt sts she woke up during surgery many years ago.  .  Coronary artery disease     2 v CAD with CTO of the RCA and high grade bifurcational LCx/OM stenosis. S/P PCI DES x 2 to the LCx/OM.  Marland Kitchen Diabetic peripheral neuropathy ([place]) "since 1996"  . GERD (gastroesophageal reflux disease)   . Goiter   . Headache    migraines prior to menopause  . History of shingles 06/01/2013  . Hyperlipidemia LDL goal <70 10/13/2015  . Hypertension   . Hyperthyroidism   . Osteomyelitis of foot (HCC)    Right  . PAF (paroxysmal atrial fibrillation) ([place]) 04/29/2015   CHADS2VASC score of 5 now on Apixaban  . Pneumonia ~ 1976  . Sleep apnea    Bipap  . Tremors of nervous system   . Type II diabetes mellitus (HCC)    insulin dependent    Family History  family history includes Allergies in her sister; Breast cancer in her maternal grandmother and mother; COPD in her father; Cancer (age of onset: 10) in her mother; Emphysema in her maternal grandfather and paternal grandfather; Heart attack in her father; Heart disease (age of onset: 36) in her father; Hypertension in her father; Leukemia in her paternal grandmother; Lung cancer in her mother; Parkinson's disease in her father.  Prior Rehab/Hospitalizations:  Has the patient had major surgery during 100 days prior to admission? Yes  Current Medications   Current Facility-Administered Medications:  .  0.9 %  sodium chloride infusion (Manually program via Guardrails IV Fluids), , Intravenous, Once, Irene Shipper, MD .  0.9 %  sodium chloride infusion (Manually program via Guardrails IV Fluids), , Intravenous, Once, Irene Shipper, MD .  0.9 %  sodium chloride infusion, , Intravenous, Continuous, Irene Shipper, MD, Stopped at 04/16/18 1149 .  acetaminophen (TYLENOL) tablet 325-650 mg, 325-650 mg, Oral, Q6H PRN, Irene Shipper, MD .  albuterol (PROVENTIL) (2.5 MG/3ML) 0.083% nebulizer solution 2.5 mg, 2.5 mg, Nebulization, Q6H PRN, Scarlette Shorts  N, MD, 2.5 mg at 04/16/18 1402 .  apixaban (ELIQUIS)  tablet 5 mg, 5 mg, Oral, BID, Carney, Gay Filler, RPH, 5 mg at 04/20/18 0957 .  atorvastatin (LIPITOR) tablet 20 mg, 20 mg, Oral, QPM, Irene Shipper, MD, 20 mg at 04/19/18 1618 .  bisacodyl (DULCOLAX) suppository 10 mg, 10 mg, Rectal, Daily PRN, Irene Shipper, MD, 10 mg at 04/18/18 2043 .  buPROPion [facility] SR) 12 hr tablet 150 mg, 150 mg, Oral, BID, Irene Shipper, MD, 150 mg at 04/20/18 0958 .  docusate sodium (COLACE) capsule 100 mg, 100 mg, Oral, BID, Irene Shipper, MD, 100 mg at 04/20/18 0957 .  fenofibrate tablet 160 mg, 160 mg, Oral, Daily, Irene Shipper, MD, 160 mg at 04/20/18 0957 .  fentaNYL (SUBLIMAZE) injection 50 mcg, 50 mcg, Intravenous, Q4H PRN, Irene Shipper, MD, 50 mcg at 04/16/18 1016 .  FLUoxetine (PROZAC) capsule 20 mg, 20 mg, Oral, QHS, Irene Shipper, MD, 20 mg at 04/19/18 2155 .  furosemide (LASIX) injection 40 mg, 40 mg, Intravenous, Q8H, Amin, Ankit Chirag, MD, 40 mg at 04/19/18 2154 .  gabapentin (NEURONTIN) capsule 900 mg, 900 mg, Oral, BID, Irene Shipper, MD, 900 mg at 04/20/18 0957 .  HYDROcodone-acetaminophen (NORCO) 7.5-325 MG per tablet 1-2 tablet, 1-2 tablet, Oral, Q4H PRN, Irene Shipper, MD, 2 tablet at 04/18/18 0102 .  HYDROcodone-acetaminophen (NORCO/VICODIN) 5-325 MG per tablet 1-2 tablet, 1-2 tablet, Oral, Q4H PRN, Irene Shipper, MD, 2 tablet at 04/20/18 [phone] .  lactated ringers infusion, , Intravenous, Continuous, Irene Shipper, MD, Stopped at 04/14/18 1900 .  lactated ringers infusion, , Intravenous, Continuous, Irene Shipper, MD, Last Rate: 10 mL/hr at 04/15/18 2300 .  magnesium citrate solution 1 Bottle, 1 Bottle, Oral, Once PRN, Irene Shipper, MD .  magnesium oxide (MAG-OX) tablet 800 mg, 800 mg, Oral, BID, Amin, Ankit Chirag, MD, 800 mg at 04/20/18 0957 .  MEDLINE mouth rinse, 15 mL, Mouth Rinse, BID, Elgergawy, Silver Huguenin, MD, 15 mL at 04/20/18 1003 .  methimazole (TAPAZOLE) tablet 10 mg, 10 mg, Oral, Daily, Irene Shipper, MD, 10 mg at 04/20/18 0958 .   methocarbamol (ROBAXIN) tablet 500 mg, 500 mg, Oral, Q6H PRN, 500 mg at 04/20/18 0956 **OR** methocarbamol (ROBAXIN) 500 mg in dextrose 5 % 50 mL IVPB, 500 mg, Intravenous, Q6H PRN, Irene Shipper, MD .  metoCLOPramide (REGLAN) tablet 5-10 mg, 5-10 mg, Oral, Q8H PRN **OR** metoCLOPramide (REGLAN) injection 5-10 mg, 5-10 mg, Intravenous, Q8H PRN, Irene Shipper, MD .  metoprolol succinate (TOPROL-XL) 24 hr tablet 25 mg, 25 mg, Oral, BID, Irene Shipper, MD, 25 mg at 04/20/18 0957 .  morphine 2 MG/ML injection 0.5-1 mg, 0.5-1 mg, Intravenous, Q2H PRN, Irene Shipper, MD .  nitroGLYCERIN (NITROSTAT) SL tablet 0.4 mg, 0.4 mg, Sublingual, Q5 min PRN, Irene Shipper, MD .  ondansetron [facility]) tablet 4 mg, 4 mg, Oral, Q6H PRN **OR** ondansetron (ZOFRAN) injection 4 mg, 4 mg, Intravenous, Q6H PRN, Irene Shipper, MD .  pantoprazole (PROTONIX) EC tablet 40 mg, 40 mg, Oral, BID, Irene Shipper, MD, 40 mg at 04/20/18 0958 .  polyethylene glycol (MIRALAX / GLYCOLAX) packet 17 g, 17 g, Oral, Daily PRN, Irene Shipper, MD, 17 g at 04/18/18 1002 .  potassium chloride SA (K-DUR,KLOR-CON) CR tablet 20 mEq, 20 mEq, Oral, Daily, Irene Shipper, MD, 20 mEq at 04/20/18 0958 .  sotalol (BETAPACE) tablet 120 mg, 120 mg,  Oral, Q12H, Irene Shipper, MD, 120 mg at 04/20/18 7106  Patients Current Diet:     Diet Order                  Diet regular Room service appropriate? 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 None of the above  Prior Functional Level Prior Function Level of Independence: Needs assistance Gait / Transfers Assistance Needed: walking with a RW ADL's / Homemaking Assistance Needed: assisted for IADL, one week prior to admission she still had an aide who assisted with showering Comments: reports independence with mobility and ADL's within last month   Self Care: Did the patient need help bathing, dressing, using the toilet or eating?  Independent  Indoor Mobility: Did the patient need assistance with walking from room to room (with or without device)? Independent  Stairs: Did the patient need assistance with internal or external stairs (with or without device)? Unknown, reports she never needed to navigate  Functional Cognition: Did the patient need help planning regular tasks such as shopping or remembering to take medications? Needed some help  Current Functional Level Cognition  Overall Cognitive Status: Impaired/Different from baseline Current Attention Level: Selective Orientation Level: Oriented X4 Safety/Judgement: Decreased awareness of safety, Decreased awareness of deficits General Comments: pt with memory loss at baseline, has hx of falls out of chair when reaching to pick up item from the floor    Extremity Assessment (includes Sensation/Coordination)  Upper Extremity Assessment: Generalized weakness  Lower Extremity Assessment: Defer to PT evaluation RLE Deficits / Details: s/p BKA. Grossly at least anti gravity throughout LLE Deficits / Details: strength 5/5    ADLs  Overall ADL's : Needs assistance/impaired Eating/Feeding: Independent, Sitting Grooming: Wash/dry  hands, Wash/dry face, Sitting, Set up Upper Body Bathing: Sitting, Minimal assistance Lower Body Bathing: Moderate assistance, Sitting/lateral leans Upper Body Dressing : Minimal assistance, Sitting Lower Body Dressing: Sitting/lateral leans, Moderate assistance Toileting- Clothing Manipulation and Hygiene: Maximal assistance, Sitting/lateral lean Functional mobility during ADLs: (pt unable to hop in place without buckling)    Mobility  Overal bed mobility: Needs Assistance Bed Mobility: Supine to Sit Supine to sit: Min assist General bed mobility comments: min assist to elevate trunk to uprigh tat EOB    Transfers  Overall transfer level: Needs assistance Equipment used: Rolling walker (2 wheeled) Transfers: Squat Pivot Transfers Sit to Stand: +2 physical assistance, Mod assist Squat pivot transfers: Min assist General transfer comment: min assist for power up and positioning, perfomed x3 during session with education for chair position and UE placement. patient using countdown method and rocker technique to build moment prior to transition    Ambulation / Gait / Stairs / Emergency planning/management officer  Posture / Balance Balance Overall balance assessment: Needs assistance Sitting-balance support: Feet supported Sitting balance-Leahy Scale: Fair Standing balance support: Bilateral upper extremity supported Standing balance-Leahy Scale: Poor    Special needs/care consideration BiPAP/CPAP: yes CPAP CPM: no Continuous Drip IV: methocarbamol (ROBAXIN), lactated ringers infusion, 0.9% sodium chloride infusion.  Dialysis: no        Days: no Life Vest: no Oxygen: yes, 2L [place] Special Bed: no Trach Size: no Wound Vac (area): yes      Location: RLE at surgical incision site.  Skin: right lower leg surgical incision                       Bowel mgmt:continent, last BM 04/18/18 Bladder mgmt: continent Diabetic mgmt: yes     Previous Home Environment Living Arrangements: Other  relatives(sister) Available Help at Discharge: Family, Available PRN/intermittently Type of Home: House Home Layout: Two level, Laundry or work area in basement Home Access: Ramped entrance [employer] Shower/Tub: Multimedia programmer: [place]: No  Discharge [place] for Discharge Living Setting: Patient's home, Lives with (comment)(lives with sister Tye [place])) Type of Home at Discharge: [place]: One level Discharge Home Access: [place] entrance Discharge Bathroom Shower/Tub: Walk-in shower(handicapped; rails) Discharge Bathroom Toilet: Standard(with rails) Discharge Bathroom Accessibility: Yes How Accessible: Accessible via walker, Accessible via wheelchair(depending on which bathroom is used) Does the patient have any problems obtaining your medications?: No  Social/Family/Support Systems Patient Roles: Parent, Other (Comment)(worked at times; has grown Naval architect; lives with sister) Sport and exercise psychologist Information: sister: Rulon Eisenmenger [phone]; daughter Mcmurry): [phone] Anticipated Caregiver: [name]-has 12/20-1/3 off; after 1st of year able to take 40 more PTO days as needed Anticipated Caregiver's Contact Information: see above Ability/Limitations of Caregiver: MIn A Caregiver Availability: Intermittent(as needed) Discharge Plan Discussed with Primary Caregiver: Yes Is Caregiver In Agreement with Plan?: Yes Does Caregiver/Family have Issues with Lodging/Transportation while Pt is in Rehab?: No   Goals/Additional Needs Patient/Family Goal for Rehab: PT/OT: Supervision/Min A; SLP: NA Expected length of stay: 16-19 days Cultural Considerations: [facility] Dietary Needs: regular diet, thin liquids Equipment Needs: TBD Pt/Family Agrees to Admission and willing to participate: Yes Program Orientation Provided & Reviewed with Pt/Caregiver Including Roles  & Responsibilities: Yes(with pt and [name])  Barriers to Discharge:  Decreased caregiver support, New oxygen, Medical stability, Weight bearing restrictions  Barriers to Discharge Comments: new O2 needs in hospital; sister does work full time with hope to return to full time shortly after pt completes rehab   Decrease burden of Care through IP rehab admission: NA   Possible need for SNF placement upon discharge:Not anticipated; pt has good prognosis for further progress, has good social support available to her at home through her sister, and has appropriate environmental modifications in her home to accommodate current status.    Patient Condition: This patient's medical and functional status has changed since the consult dated 04/17/18 in which the Rehabilitation Physician determined and documented that the patient was potentially appropriate for intensive rehabilitative care in an inpatient rehabilitation facility. Issues have been addressed and update has been discussed with Dr. Letta Pate  and patient now appropriate for inpatient rehabilitation. Pt is now medically stable and demonstrates ability to tolerate 3 hours of therapy daily. Pt has stable caregiver support at DC with pt's sister able to take up to 40 days paid time off to assist as needed. Will admit to inpatient rehab today.   Preadmission Screen Completed By:  Jhonnie Garner, 04/20/2018  1:32 PM ______________________________________________________________________   Discussed status with Dr. Letta Pate on 04/20/18 at 1:32PM and received telephone approval for admission today.  Admission Coordinator:  Jhonnie Garner, time 1:32PM/Date 04/20/18.           Cosigned by: Charlett Blake, MD at 04/20/2018 2:52 PM  Revision History

## 2018-04-20 NOTE — Progress Notes (Signed)
Patient ID: Judith [name], female   DOB: [birth year], 67 y.o.   MRN: [id number] Patient resting comfortable this morning no drainage in the wound VAC canister.  Patient's EKG shows that she was being paced.

## 2018-04-20 NOTE — Progress Notes (Signed)
Physical Medicine and Rehabilitation Consult Reason for Consult:  Decreased functional mobility Referring Physician: [facility]   HPI: Judith [name] is a 67 y.o.right handed female with history of sleep apnea with CPAP,CAD with stenting,diabetes mellitus with peripheral neuropathy,CKDstage III,hypertension, quit smoking 3 years ago,PAF maintained on Eliquis, PVD with history of right toe amputations. Per chart review and patient, patient lives with sister. One level home with ramped entrance. There is a laundry in the basement. Reportedly independent prior to admission using a walker but sometimes needs a wheelchair . Presented 04/14/2018 with ulcer of the right foot with ischemic changes. No change with conservative care and underwent right transtibial amputation 04/15/2018 per Dr. Sharol Given. Wound VAC applied. Hospital course pain management. Gastroenterology consulted Dr. Henrene Pastor for anemia with black tarry stools. Hemoglobin decreased to 7.5 patient was transfused. A recent CT of the abdomen in September 2019 for nausea vomiting unremarkable.Gastric emptying scan 02/14/2018 normal study. Endoscopy completed 04/16/2018 significant for ulcerated esophagitis and superficial ulcer. Advise continue PPI. Patient presently maintained on intravenous heparin and await plan to possibly resume chronic Eliquis. Therapy evaluations completed with recommendations of physical medicine rehabilitation consult.   Review of Systems  Constitutional: Negative for chills and fever.  HENT: Negative for hearing loss.   Eyes: Negative for blurred vision and double vision.  Respiratory: Negative for cough and shortness of breath.   Gastrointestinal: Positive for blood in stool, constipation, nausea and vomiting.       GERD  Genitourinary: Negative for dysuria and hematuria.  Musculoskeletal: Positive for joint pain and myalgias.  Skin: Negative for rash.  Neurological: Positive for tremors, sensory change and headaches.    Psychiatric/Behavioral: Positive for depression.  All other systems reviewed and are negative.      Past Medical History:  Diagnosis Date  . Abnormal EKG 07/31/2013  . Arthritis    "hands" (03/06/2015)  . Asthma   . Carpal tunnel syndrome, bilateral   . Chronic kidney disease   . Complication of anesthesia    slow to wake up. Pt sts she woke up during surgery many years ago.  . Coronary artery disease     2 v CAD with CTO of the RCA and high grade bifurcational LCx/OM stenosis. S/P PCI DES x 2 to the LCx/OM.  Marland Kitchen Diabetic peripheral neuropathy ([place]) "since 1996"  . GERD (gastroesophageal reflux disease)   . Goiter   . Headache    migraines prior to menopause  . History of shingles 06/01/2013  . Hyperlipidemia LDL goal <70 10/13/2015  . Hypertension   . Hyperthyroidism   . Osteomyelitis of foot (HCC)    Right  . PAF (paroxysmal atrial fibrillation) ([place]) 04/29/2015   CHADS2VASC score of 5 now on Apixaban  . Pneumonia ~ 1976  . Sleep apnea    Bipap  . Tremors of nervous system   . Type II diabetes mellitus (HCC)    insulin dependent        Past Surgical History:  Procedure Laterality Date  . ABDOMINAL HYSTERECTOMY  1988   age 56; CERVICAL DYSPLASIA; ovaries intact.   . AMPUTATION Right 01/23/2016   Procedure: Right 3rd Ray Amputation;  Surgeon: [name], MD;  Location: [facility];  Service: Orthopedics;  Laterality: Right;  . AMPUTATION Right 02/13/2016   Procedure: Right Transmetatarsal Amputation;  Surgeon: [name], MD;  Location: [place];  Service: Orthopedics;  Laterality: Right;  . AMPUTATION Right 04/15/2018   Procedure: AMPUTATION BELOW KNEE;  Surgeon: [name], MD;  Location: [facility];  Service: Orthopedics;  Laterality: Right;  . CARDIAC CATHETERIZATION N/A 02/27/2015   Procedure: Left Heart Cath and Coronary Angiography;  Surgeon: [name], MD; LAD 40%, mCFX 80%, OM 70%, RCA 100% calcified       . CARDIAC CATHETERIZATION  N/A 03/06/2015   Procedure: Coronary Stent Intervention;  Surgeon: [name], MD;  Location: [facility];  Service: Cardiovascular;  Laterality: N/A;  Mid CX 3.50x12 promus DES w/ 0% resdual and Prox OM1 2.50x20 promus DES w/ 20% residual  . CARDIOVERSION    . CARPAL TUNNEL RELEASE Right Nov 2015  . CARPAL TUNNEL RELEASE Right 1992; 05/2014   [name]; [place]  . CESAREAN SECTION  1982; 1984  . FOOT NEUROMA SURGERY Bilateral 2000  . I&D EXTREMITY Left 01/06/2018   Procedure: DEBRIDEMENT ULCER LEFT FOOT;  Surgeon: [name], MD;  Location: [facility];  Service: Orthopedics;  Laterality: Left;  . KNEE ARTHROSCOPY Right ~ 2003   "meniscus repair"  . SHOULDER OPEN ROTATOR CUFF REPAIR Right 1996; 1998   "w/fracture repair"  . THYROID SURGERY  2000   "removed lots of nodules"  . TONSILLECTOMY  1976        Family History  Problem Relation Age of Onset  . Cancer Mother 59       bronchial cancer  . Breast cancer Mother   . Lung cancer Mother   . Hypertension Father   . COPD Father   . Heart disease Father 31       CAD with cardiac stenting  . Heart attack Father   . Parkinson's disease Father   . Allergies Sister   . Breast cancer Maternal Grandmother   . Emphysema Maternal Grandfather   . Leukemia Paternal Grandmother   . Emphysema Paternal Grandfather   . Thyroid disease Neg Hx    Social History:  reports that she quit smoking about 3 years ago. Her smoking use included cigarettes. She smoked 0.00 packs per day for 41.00 years. She has never used smokeless tobacco. She reports that she does not drink alcohol or use drugs. Allergies:       Allergies  Allergen Reactions  . Contrast Media [Iodinated Diagnostic Agents] Hives and Other (See Comments)    Spoke to patient, Iodine allergy is really IV contrast allergy.   [name] [Insulin Aspart] Shortness Of Breath and Other (See Comments)    "breathing problems"  . Propofol Shortness Of  Breath    "Breathing problems - asthma attack" Can take with benadryl   . Codeine Nausea And Vomiting and Other (See Comments)    HIGH DOSES-SEVERE VOMITING  . Iodine Other (See Comments)    MUST HAVE BENADRYL PRIOR TO PROCEDURE AND RIGHT BEFORE TREATMENT TO COUNTERACT REACTION-BLISTERING REACTION DERMATOLOGICAL  . Penicillins Itching, Rash and Other (See Comments)    Has patient had a PCN reaction causing immediate rash, facial/tongue/throat swelling, SOB or lightheadedness with hypotension: no Has patient had a PCN reaction causing severe rash involving mucus membranes or skin necrosis: No Has patient had a PCN reaction that required hospitalization No Has patient had a PCN reaction occurring within the last 10 years: No If all of the above answers are "NO", then may proceed with Cephalosporin use.  CHEST SIZED RASH AND ITCHING   . Ace Inhibitors Cough  . Demerol [Meperidine] Nausea And Vomiting  . Dilaudid [Hydromorphone Hcl] Other (See Comments)    HEADACHE   . Neosporin [Neomycin-Bacitracin Zn-Polymyx] Itching, Rash and Other (See  Comments)    MAKES REACTIONS WORSE WHEN USING AS PROPHYLACTIC  . Percocet [Oxycodone-Acetaminophen] Rash  . Tape Itching and Rash         Medications Prior to Admission  Medication Sig Dispense Refill  . Alcohol Swabs (B-D SINGLE USE SWABS REGULAR) PADS 1 each by Does not apply route 2 (two) times daily. 100 each 2  . amLODipine (NORVASC) 5 MG tablet Take 1 tablet (5 mg total) by mouth daily. 30 tablet 0  . apixaban (ELIQUIS) 5 MG TABS tablet Take 1 tablet (5 mg total) by mouth 2 (two) times daily. 180 tablet 3  . atorvastatin (LIPITOR) 20 MG tablet Take 1 tablet (20 mg total) by mouth daily. (Patient taking differently: Take 20 mg by mouth every evening. ) 90 tablet 3  . Blood Glucose Calibration (TRUE METRIX LEVEL 1) Low SOLN 1 each by In Vitro route as needed. 1 each 0  . Blood Glucose Monitoring Suppl (TRUE METRIX AIR GLUCOSE  METER) w/Device KIT 1 each by Does not apply route daily. 1 kit 0  . buPROPion (WELLBUTRIN SR) 150 MG 12 hr tablet Take 150 mg by mouth 2 (two) times daily.    . fenofibrate (TRICOR) 145 MG tablet Take 1 tablet (145 mg total) by mouth daily. (Patient taking differently: Take 145 mg by mouth every evening. ) 90 tablet 3  . FLUoxetine (PROZAC) 20 MG capsule Take 1 capsule (20 mg total) by mouth at bedtime. 90 capsule 2  . furosemide (LASIX) 20 MG tablet Take 3 tablets (60 mg total) by mouth daily. 273 tablet 3  . gabapentin (NEURONTIN) 300 MG capsule Take 3 capsules (900 mg total) by mouth 2 (two) times daily. TAKE 3 CAPSULES BY MOUTH EVERY  MORNING AND 3 CAPSULES AT BEDTIME (Patient taking differently: Take 900 mg by mouth 2 (two) times daily. ) 540 capsule 0  . glucose blood (TRUE METRIX BLOOD GLUCOSE TEST) test strip Used to check blood sugars 2x daily. 100 each 12  . insulin degludec (TRESIBA FLEXTOUCH) 100 UNIT/ML SOPN FlexTouch Pen Inject 0.15 mLs (15 Units total) into the skin daily. (Patient taking differently: Inject 20 Units into the skin every evening. ) 15 mL 0  . Insulin Pen Needle 32G X 4 MM MISC Use to inject insulin daily 100 each 5  . Insulin Syringe-Needle U-100 (INSULIN SYRINGE .5CC/31GX5/16") 31G X 5/16" 0.5 ML MISC Use to inject insulin 100 each 5  . metFORMIN (GLUCOPHAGE-XR) 500 MG 24 hr tablet Take 4 tablets (2,000 mg total) by mouth daily with breakfast. (Patient taking differently: Take 1,000 mg by mouth 2 (two) times daily. ) 360 tablet 3  . methimazole (TAPAZOLE) 10 MG tablet Take 1 tablet (10 mg total) by mouth daily. 30 tablet 11  . metoprolol succinate (TOPROL-XL) 25 MG 24 hr tablet Take 1 tablet (25 mg total) by mouth 2 (two) times daily. Take with or immediately following a meal. 180 tablet 1  . pantoprazole (PROTONIX) 40 MG tablet Take 1 tablet (40 mg total) by mouth daily. 90 tablet 0  . potassium chloride SA (K-DUR,KLOR-CON) 20 MEQ tablet Take 1 tablet (20 mEq total)  by mouth daily.    . sotalol (BETAPACE) 120 MG tablet Take 1 tablet (120 mg total) by mouth every 12 (twelve) hours. 180 tablet 3  . traMADol (ULTRAM) 50 MG tablet Take 50 mg by mouth every 8 (eight) hours as needed for moderate pain or severe pain.   5  . TRUEPLUS LANCETS 28G MISC 1 each  by Does not apply route 2 (two) times daily. 100 each 2  . albuterol (PROVENTIL HFA) 108 (90 Base) MCG/ACT inhaler Inhale 1-2 puffs into the lungs every 6 (six) hours as needed for wheezing or shortness of breath. 1 Inhaler 0  . albuterol (PROVENTIL) (2.5 MG/3ML) 0.083% nebulizer solution Take 3 mLs (2.5 mg total) by nebulization every 6 (six) hours as needed for wheezing or shortness of breath. 150 mL 0  . nitroGLYCERIN (NITROSTAT) 0.4 MG SL tablet Place 1 tablet (0.4 mg total) under the tongue every 5 (five) minutes as needed for chest pain. 25 tablet 3  . ondansetron (ZOFRAN-ODT) 8 MG disintegrating tablet Take 1 tablet (8 mg total) by mouth every 8 (eight) hours as needed for nausea or vomiting. 20 tablet 1  . triamcinolone cream (KENALOG) 0.1 % Apply 1 application topically 2 (two) times daily. (Patient taking differently: Apply 1 application topically 2 (two) times daily as needed (for skin rash/irritation.). ) 30 g 0    Home: Home Living Family/patient expects to be discharged to:: Private residence Living Arrangements: Other relatives(sister) Available Help at Discharge: Family, Available PRN/intermittently Type of Home: House Home Access: [place]: Two level, Laundry or work area in basement [employer] Shower/Tub: Multimedia programmer: [place]: Environmental consultant - 2 wheels, Wheelchair - manual, Bedside commode, Other (comment)(BSC used in shower)  Functional History: Prior Function Level of Independence: Independent Comments: reports independence with mobility and ADL's within last month  Functional Status:  Mobility: Bed Mobility Overal bed mobility: Needs  Assistance Bed Mobility: Supine to Sit Supine to sit: Min assist General bed mobility comments: light min assist up to elevate trunk with use of bed rail Transfers Overall transfer level: Needs assistance Equipment used: Rolling walker (2 wheeled) Transfers: Sit to/from Stand, [employer] Transfers Sit to Stand: Mod assist, +2 physical assistance Squat pivot transfers: Min assist, +2 safety/equipment General transfer comment: Pt requiring modA + 2 to boost up to stand with increased time and effort. cues for hand positioning. Pt unable to achieve significant foot clearance with attempt to hop in place. Sat back down onto bed and performed low pivot towards left with min assist   ADL:  Cognition: Cognition Overall Cognitive Status: Within Functional Limits for tasks assessed Orientation Level: Oriented X4 Cognition Arousal/Alertness: Awake/alert Behavior During Therapy: WFL for tasks assessed/performed Overall Cognitive Status: Within Functional Limits for tasks assessed General Comments: Pt and pt daughter can be tangential  Blood pressure 129/65, pulse (!) 55, temperature 97.6 F (36.4 C), temperature source Axillary, resp. rate 13, height 5' 3.5" (1.613 m), weight 72.6 kg, SpO2 94 %. Physical Exam  Vitals reviewed. Constitutional: She is oriented to person, place, and time. She appears well-developed.  Obese  HENT:  Head: Normocephalic and atraumatic.  Eyes: EOM are normal. Right eye exhibits no discharge. Left eye exhibits no discharge.  Neck: Normal range of motion. Neck supple. No thyromegaly present.  Cardiovascular: Normal rate and regular rhythm.  Respiratory: Effort normal and breath sounds normal. No respiratory distress.  + [place]  GI: She exhibits distension.  Decreased bowel sounds  Musculoskeletal:  Right BKA  Neurological: She is alert and oriented to person, place, and time.  Motor: Bilateral upper extremities: 4+/5 proximal distal Left lower extremity: 4/5  proximal distal Right lower extremity: Hip flexion 4/5 (pain inhibition) Sensation diminished light touch left foot  Skin:  Wound VAC with limb guard in place  Psychiatric: She has a normal mood and affect. Her behavior  is normal.          Results for orders placed or performed during the hospital encounter of 04/14/18 (from the past 24 hour(s))  Prepare RBC     Status: None   Collection Time: 04/16/18  7:18 AM  Result Value Ref Range   Order Confirmation      ORDER PROCESSED BY BLOOD BANK Performed at [facility], [place] [address], [place], [place] [address]   Glucose, capillary     Status: None   Collection Time: 04/16/18  7:54 AM  Result Value Ref Range   Glucose-Capillary 87 70 - 99 mg/dL   Comment 1 Notify RN    Comment 2 Document in Chart   Glucose, capillary     Status: None   Collection Time: 04/16/18 12:20 PM  Result Value Ref Range   Glucose-Capillary 70 70 - 99 mg/dL  Glucose, capillary     Status: Abnormal   Collection Time: 04/16/18  5:06 PM  Result Value Ref Range   Glucose-Capillary 150 (H) 70 - 99 mg/dL  Glucose, capillary     Status: Abnormal   Collection Time: 04/16/18  9:06 PM  Result Value Ref Range   Glucose-Capillary 118 (H) 70 - 99 mg/dL  APTT     Status: Abnormal   Collection Time: 04/16/18  9:40 PM  Result Value Ref Range   aPTT 58 (H) 24 - 36 seconds   No results found.  Assessment/Plan: Diagnosis: Left BKA Labs independently reviewed.  Records reviewed and summated above. Clean amputation daily with soap and water Monitor incision site for signs of infection or impending skin breakdown. Staples to remain in place for 3-4 weeks Stump shrinker, for edema control, after DC VAC Scar mobilization massaging to prevent soft tissue adherence Stump protector during therapies Prevent flexion contractures by implementing the following:              Encourage prone lying for 20-30 mins per day BID to avoid hip flexion        Contractures if medically appropriate;             Avoid pillow under knees when patient is lying in bed in order to prevent both        knee and hip flexion contractures;             Avoid prolonged sitting Post surgical pain control with oral medication Phantom limb pain control with physical modalities including desensitization techniques (gentle self massage to the residual stump,hot packs if sensation intact, [place]) and mirror therapy, TENS. If ineffective, consider pharmacological treatment for neuropathic pain (e.g gabapentin, pregabalin, amytriptalyine, duloxetine).  When using wheelchair, patient should have knee on amputated side fully extended with board under the seat cushion. Avoid injury to contralateral side  1. Does the need for close, 24 hr/day medical supervision in concert with the patient's rehab needs make it unreasonable for this patient to be served in a less intensive setting? Yes  2. Co-Morbidities requiring supervision/potential complications: ABLA (repeat labs, transfuse again to ensure appropriate perfusion for increased activity tolerance), post-op pain management (Biofeedback training with therapies to help reduce reliance on opiate pain medications, monitor pain control during therapies, and sedation at rest and titrate to maximum efficacy to ensure participation and gains in therapies), sleep apnea, CAD, DM with peripheral neuropathy (Monitor in accordance with exercise and adjust meds as necessary), CKD stage III (avoid nephrotoxic meds),HTN (monitor and provide prns in accordance with increased physical exertion and pain),  PAF (monitor heart rate with increased activity, transition from heparin GTT to oral anticoagulation when appropriate), PVD  3. Due to bladder management, bowel management, safety, skin/wound care, disease management, pain management and patient education, does the patient require 24 hr/day rehab nursing? Yes 4. Does the patient require coordinated  care of a physician, rehab nurse, PT (1-2 hrs/day, 5 days/week) and OT (1-2 hrs/day, 5 days/week) to address physical and functional deficits in the context of the above medical diagnosis(es)? Yes Addressing deficits in the following areas: balance, endurance, locomotion, strength, transferring, bathing, dressing, toileting and psychosocial support 5. Can the patient actively participate in an intensive therapy program of at least 3 hrs of therapy per day at least 5 days per week? Potentially 6. The potential for patient to make measurable gains while on inpatient rehab is excellent 7. Anticipated functional outcomes upon discharge from inpatient rehab are supervision and min assist  with PT, supervision and min assist with OT, n/a with SLP. 8. Estimated rehab length of stay to reach the above functional goals is: 16-19 days. 9. Anticipated D/C setting: Home 10. Anticipated post D/C treatments: HH therapy and Home excercise program 11. Overall Rehab/Functional Prognosis: good  RECOMMENDATIONS: This patient's condition is appropriate for continued rehabilitative care in the following setting: CIR when medically stable and able to tolerate 3 hours of therapy/day if caregiver support available. Patient has agreed to participate in recommended program. Yes Note that insurance prior authorization may be required for reimbursement for recommended care.  Comment: Rehab Admissions Coordinator to follow up.   I have personally performed a face to face diagnostic evaluation, including, but not limited to relevant history and physical exam findings, of this patient and developed relevant assessment and plan.  Additionally, I have reviewed and concur with the physician assistant's documentation above.   Delice Lesch, MD, ABPMR Lavon Paganini Angiulli, PA-C 04/17/2018        Revision History                        Routing History

## 2018-04-21 ENCOUNTER — Inpatient Hospital Stay (HOSPITAL_COMMUNITY): Payer: Medicare PPO

## 2018-04-21 ENCOUNTER — Other Ambulatory Visit: Payer: Self-pay

## 2018-04-21 ENCOUNTER — Inpatient Hospital Stay (HOSPITAL_COMMUNITY): Payer: Medicare PPO | Admitting: Physical Therapy

## 2018-04-21 DIAGNOSIS — E1151 Type 2 diabetes mellitus with diabetic peripheral angiopathy without gangrene: Secondary | ICD-10-CM

## 2018-04-21 DIAGNOSIS — I10 Essential (primary) hypertension: Secondary | ICD-10-CM

## 2018-04-21 LAB — COMPREHENSIVE METABOLIC PANEL
ALBUMIN: 2.5 g/dL — AB (ref 3.5–5.0)
ALT: 60 U/L — ABNORMAL HIGH (ref 0–44)
AST: 39 U/L (ref 15–41)
Alkaline Phosphatase: 71 U/L (ref 38–126)
Anion gap: 12 (ref 5–15)
BUN: 18 mg/dL (ref 8–23)
CO2: 31 mmol/L (ref 22–32)
Calcium: 9.3 mg/dL (ref 8.9–10.3)
Chloride: 94 mmol/L — ABNORMAL LOW (ref 98–111)
Creatinine, Ser: 1.1 mg/dL — ABNORMAL HIGH (ref 0.44–1.00)
GFR calc Af Amer: >60 mL/min (ref >60)
GFR calc non Af Amer: 53 mL/min — ABNORMAL LOW (ref >60)
GLUCOSE: 155 mg/dL — AB (ref 70–99)
Potassium: 4.5 mmol/L (ref 3.5–5.1)
Sodium: 137 mmol/L (ref 135–145)
Total Bilirubin: 0.8 mg/dL (ref 0.3–1.2)
Total Protein: 7.1 g/dL (ref 6.5–8.1)

## 2018-04-21 LAB — CBC WITH DIFFERENTIAL/PLATELET
ABS IMMATURE GRANULOCYTES: 0.4 10*3/uL — AB (ref 0.00–0.07)
Basophils Absolute: 0.1 10*3/uL (ref 0.0–0.1)
Basophils Relative: 1 %
Eosinophils Absolute: 0.4 10*3/uL (ref 0.0–0.5)
Eosinophils Relative: 5 %
HCT: 36.1 % (ref 36.0–46.0)
Hemoglobin: 10.8 g/dL — ABNORMAL LOW (ref 12.0–15.0)
Immature Granulocytes: 5 %
LYMPHS PCT: 17 %
Lymphs Abs: 1.3 10*3/uL (ref 0.7–4.0)
MCH: 26.7 pg (ref 26.0–34.0)
MCHC: 29.9 g/dL — ABNORMAL LOW (ref 30.0–36.0)
MCV: 89.1 fL (ref 80.0–100.0)
Monocytes Absolute: 0.4 10*3/uL (ref 0.1–1.0)
Monocytes Relative: 6 %
NEUTROS PCT: 66 %
Neutro Abs: 5.1 10*3/uL (ref 1.7–7.7)
Platelets: 589 10*3/uL — ABNORMAL HIGH (ref 150–400)
RBC: 4.05 MIL/uL (ref 3.87–5.11)
RDW: 16.7 % — ABNORMAL HIGH (ref 11.5–15.5)
WBC: 7.6 10*3/uL (ref 4.0–10.5)
nRBC: 0 % (ref 0.0–0.2)

## 2018-04-21 LAB — GLUCOSE, CAPILLARY: Glucose-Capillary: 128 mg/dL — ABNORMAL HIGH (ref 70–99)

## 2018-04-21 MED ORDER — AMLODIPINE BESYLATE 2.5 MG PO TABS
2.5000 mg | ORAL_TABLET | Freq: Every day | ORAL | Status: DC
  Administered 2018-04-21 – 2018-04-24 (×4): 2.5 mg via ORAL
  Filled 2018-04-21 (×4): qty 1

## 2018-04-21 NOTE — Progress Notes (Signed)
Social Work  Social Work Assessment and Plan  Patient Details  Name: Judith [name] MRN: [id number] Date of Birth: 1951/01/24  Today's Date: 04/21/2018  Problem List:  Patient Active Problem List   Diagnosis Date Noted  . Amputation of right lower extremity below knee ([place]) 04/20/2018  . 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 Post-operative pain   . PAF (paroxysmal atrial fibrillation) ([place])   . Duodenal ulcer   . Atrial fibrillation with RVR ([place]) 04/14/2018  . Foot osteomyelitis, right ([place]) 04/14/2018  . Cutaneous abscess of left foot   . Acute respiratory failure with hypoxia ([place])   . Cellulitis 01/02/2018  . LGI bleed   . Acute blood loss anemia   . Wide-complex tachycardia ([place]) 07/04/2017  . Type II diabetes mellitus, uncontrolled ([place]) 07/04/2017  . Peripheral neuropathy 07/04/2017  . H/O hyperthyroidism 07/04/2017  . S/P transmetatarsal amputation of foot, right ([place]) 04/19/2016  . Obstructive sleep apnea 11/26/2015  . Bilateral carpal tunnel syndrome 11/26/2015  . Hypomagnesemia 11/16/2015  . Hyperlipidemia LDL goal <70 10/13/2015  . Abnormality of gait 09/02/2015  . Memory loss 08/12/2015  . Diabetic peripheral neuropathy ([place]) 08/12/2015  . Vitamin D deficiency 08/12/2015  . Hyperthyroidism 04/30/2015  . Heme positive stool   . Persistent atrial fibrillation 04/29/2015  . Coronary artery disease with stable angina pectoris ([place]) 03/29/2015  . Abnormal nuclear stress test   . History of goiter 09/28/2014  . GERD (gastroesophageal reflux disease) 07/31/2013  . Depression with anxiety 06/01/2013  . DM (diabetes mellitus), type 2 with peripheral vascular complications ([place]) 30/86/5784  . HTN (hypertension) 01/30/2013   Past Medical History:  Past Medical History:  Diagnosis Date  . Abnormal EKG 07/31/2013  . Arthritis    "hands" (03/06/2015)  . Asthma   . Carpal tunnel syndrome, bilateral    . Chronic kidney disease   . Complication of anesthesia    slow to wake up. Pt sts she woke up during surgery many years ago.  . Coronary artery disease     2 v CAD with CTO of the RCA and high grade bifurcational LCx/OM stenosis. S/P PCI DES x 2 to the LCx/OM.  Marland Kitchen Diabetic peripheral neuropathy ([place]) "since 1996"  . GERD (gastroesophageal reflux disease)   . Goiter   . Headache    migraines prior to menopause  . History of shingles 06/01/2013  . Hyperlipidemia LDL goal <70 10/13/2015  . Hypertension   . Hyperthyroidism   . Osteomyelitis of foot ([facility])    Right  . PAF (paroxysmal atrial fibrillation) ([place]) 04/29/2015   CHADS2VASC score of 5 now on Apixaban  . Pneumonia ~ 1976  . Sleep apnea    Bipap  . Tremors of nervous system   . Type II diabetes mellitus ([facility])    insulin dependent   Past Surgical History:  Past Surgical History:  Procedure Laterality Date  . ABDOMINAL HYSTERECTOMY  1988   age 43; CERVICAL DYSPLASIA; ovaries intact.   . AMPUTATION Right 01/23/2016   Procedure: Right 3rd Ray Amputation;  Surgeon: [name], MD;  Location: [facility];  Service: Orthopedics;  Laterality: Right;  . AMPUTATION Right 02/13/2016   Procedure: Right Transmetatarsal Amputation;  Surgeon: [name], MD;  Location: [facility];  Service: Orthopedics;  Laterality: Right;  . AMPUTATION Right 04/15/2018   Procedure: AMPUTATION BELOW KNEE;  Surgeon: [name], MD;  Location: [facility];  Service: Orthopedics;  Laterality:  Right;  Marland Kitchen BIOPSY  04/16/2018   Procedure: BIOPSY;  Surgeon: Irene Shipper, MD;  Location: [facility] ENDOSCOPY;  Service: Endoscopy;;  . CARDIAC CATHETERIZATION N/A 02/27/2015   Procedure: Left Heart Cath and Coronary Angiography;  Surgeon: [name], MD; LAD 40%, mCFX 80%, OM 70%, RCA 100% calcified       . CARDIAC CATHETERIZATION N/A 03/06/2015   Procedure: Coronary Stent Intervention;  Surgeon: [name], MD;  Location: [facility];  Service: Cardiovascular;   Laterality: N/A;  Mid CX 3.50x12 promus DES w/ 0% resdual and Prox OM1 2.50x20 promus DES w/ 20% residual  . CARDIOVERSION    . CARPAL TUNNEL RELEASE Right Nov 2015  . CARPAL TUNNEL RELEASE Right 1992; 05/2014   [name]; [place]  . CESAREAN SECTION  1982; 1984  . ESOPHAGOGASTRODUODENOSCOPY (EGD) WITH PROPOFOL N/A 04/16/2018   Procedure: ESOPHAGOGASTRODUODENOSCOPY (EGD) WITH PROPOFOL;  Surgeon: Irene Shipper, MD;  Location: [facility] ENDOSCOPY;  Service: Endoscopy;  Laterality: N/A;  . FOOT NEUROMA SURGERY Bilateral 2000  . I&D EXTREMITY Left 01/06/2018   Procedure: DEBRIDEMENT ULCER LEFT FOOT;  Surgeon: [name], MD;  Location: [facility];  Service: Orthopedics;  Laterality: Left;  . KNEE ARTHROSCOPY Right ~ 2003   "meniscus repair"  . SHOULDER OPEN ROTATOR CUFF REPAIR Right 1996; 1998   "w/fracture repair"  . THYROID SURGERY  2000   "removed lots of nodules"  . TONSILLECTOMY  1976   Social History:  reports that she quit smoking about 3 years ago. Her smoking use included cigarettes. She smoked 0.00 packs per day for 41.00 years. She has never used smokeless tobacco. She reports that she does not drink alcohol or use drugs.  Family / Support Systems Marital Status: Single Patient Roles: Parent, Other (Comment)(sibling and part time employed) Children: Quilla Scopel-daughter [phone] Other Supports: Tye Maryland Cox-sister [phone] Anticipated Caregiver: Tye [place] is off from 12/20-1/3 and can take more time off if needed. Daughter is local and can check on her Ability/Limitations of Caregiver: Tye [place] has no limitations but will need to get back to work Caregiver Availability: Evenings only Family Dynamics: Close with her sister and brother who are local, her daughter is also local and visits. She has a son in [place] who she see's yearly. She really relies upon her sister for her care and anything.  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 Return to Work Plans: Probably not now Public relations account executive Issues: No issues Guardian/Conservator: none-according to MD pt is capable of making her own decisions while here, she does want her sister involved while here   Abuse/Neglect Abuse/Neglect Assessment Can Be Completed: Yes Physical Abuse: Denies Verbal Abuse: Denies Sexual Abuse: Denies Exploitation of patient/patient's resources: Denies Self-Neglect: Denies  Emotional Status Pt's affect, behavior and adjustment status: Pt is motivated to do well and reach a independent level from a wheelchair she doesn't want to burden her sister and feels she already does enough for her. She is glad to be here on the rehab unit she knows she will get good therapies and will make progress here. Recent Psychosocial Issues: other medical conditions she takes each day at a time. Psychiatric History: History of depression/anxiety takes medications for this and finds it helpful. She may benefit from seeing neruo-psych while here. Will make referral. Substance Abuse History: No issues  Patient / Family Perceptions, Expectations & Goals  Pt/Family understanding of illness & functional limitations: Pt and sister can explain her amputation she did have an infection in her left leg but this healed thank God pt states. Both talk with the MD's and feel they have a good understanding of her treatment plan going forward. Pt is ready to begin today. Premorbid pt/family roles/activities: mom, retiree, sibling, church member, friend, etc Anticipated changes in roles/activities/participation: resume Pt/family expectations/goals: Pt states: " I want to be able to move around on my own so when my sister is working I can manage myself."  Sister states: " I hope she will do well and can transfer on her own."  [employer]: Other (Comment)(NP who visits at her home) Premorbid Home Care/DME Agencies: Other (Comment)([facility] active pt with ) Transportation available at discharge: Family Resource referrals recommended: Neuropsychology, Support group (specify)  Discharge Planning Living Arrangements: Other relatives Support Systems: Children, Other relatives, Friends/neighbors, Church/faith community Type of Residence: Private residence Insurance Resources: [employer] (specify county), Multimedia programmer (specify)([employer]) Museum/gallery curator Resources: Employment, Fish farm manager, Family Support Financial Screen Referred: No Living Expenses: Lives with family Money Management: Patient, Family Does the patient have any problems obtaining your medications?: No Home Management: Both she and sister Patient/Family Preliminary Plans: Retrun home with sister who is off from 12/20-1/3 and can take more time off if needed. Pt hopes she will be able to at least be W/C levle when she goes home and can transfer herself. Will await therapy team evaluations and work on discharge needs. Social Work Anticipated Follow Up Needs: HH/OP, Support Group  Clinical Impression Pleasant motivated female who is willing to push through her pain to achieve her goals while here. Her sister and daughter are involved and willing to assist. Her primary caregiver is her sister. Will work on discharge needs and have neuro-psych see while here due to her history of depression/anxiety.  Elease Hashimoto 04/21/2018, 9:49 AM

## 2018-04-21 NOTE — Progress Notes (Signed)
[facility] PHYSICAL MEDICINE & REHABILITATION PROGRESS NOTE   Subjective/Complaints: Slept ok last noc except one episode of pain around RIght knee, not dital end of stump, no phantom pain   Objective:   No results found. Recent Labs    04/19/18 0323 04/21/18 0742  WBC 9.7 7.6  HGB 9.2* 10.8*  HCT 29.5* 36.1  PLT 465* 589*   Recent Labs    04/19/18 0323 04/20/18 0233  NA 137 136  K 4.2 4.3  CL 101 99  CO2 27 28  GLUCOSE 65* 211*  BUN 24* 21  CREATININE 1.43* 1.40*  CALCIUM 8.6* 8.7*    Intake/Output Summary (Last 24 hours) at 04/21/2018 0834 Last data filed at 04/20/2018 1700 Gross per 24 hour  Intake -  Output 0 ml  Net 0 ml     Physical Exam: Vital Signs Blood pressure (!) 159/72, pulse (!) 52, temperature 97.8 F (36.6 C), temperature source Oral, resp. rate 17, height 5\' 4"  (1.626 m), weight 78 kg, SpO2 96 %.  General: No acute distress Mood and affect are appropriate Heart: Regular rate and rhythm no rubs murmurs or extra sounds Lungs: Clear to auscultation, breathing unlabored, no rales or wheezes Abdomen: Positive bowel sounds, soft nontender to palpation, nondistended Extremities: No clubbing, cyanosis, or edema Skin: No evidence of breakdown, no evidence of rash, indentation from wound vac tube around Right patellar no breakdown, corresponds with her knee pain Neurologic: Cranial nerves II through XII intact, motor strength is 5/5 in bilateral deltoid, bicep, tricep, grip,Left  hip flexor, knee extensors, ankle dorsiflexor and plantar flexor 4/5 RIght HF Sensory exam normal sensation to light touch and proprioception in bilateral upper and lower extremities Cerebellar exam normal finger to nose to finger as well as heel to shin in bilateral upper and lower extremities Musculoskeletal: Full range of motion in all 4 extremities. No joint swelling    Assessment/Plan: 1. Functional deficits secondary to Right transtibial amputation  which require 3+  hours per day of interdisciplinary therapy in a comprehensive inpatient rehab setting.  Physiatrist is providing close team supervision and 24 hour management of active medical problems listed below.  Physiatrist and rehab team continue to assess barriers to discharge/monitor patient progress toward functional and medical goals  Care Tool:  Bathing              Bathing assist       Upper Body Dressing/Undressing Upper body dressing   What is the patient wearing?: Hospital gown only    Upper body assist      Lower Body Dressing/Undressing Lower body dressing            Lower body assist       Toileting Toileting    Toileting assist Assist for toileting: Minimal Assistance - Patient > 75%(pt uses the bedpan)     Transfers Chair/bed transfer  Transfers assist           Locomotion Ambulation   Ambulation assist              Walk 10 feet activity   Assist           Walk 50 feet activity   Assist           Walk 150 feet activity   Assist           Walk 10 feet on uneven surface  activity   Assist           Wheelchair  Assist               Wheelchair 50 feet with 2 turns activity    Assist            Wheelchair 150 feet activity     Assist          Medical Problem List and Plan: 1.Decreased functional mobilitysecondary to right transtibial amputation 04/15/2018.Wound VAC 7 days 2. DVT Prophylaxis/Anticoagulation: Eliquis 3. Pain Management:Neurontin 900 mg twice a day, hydrocodone and Robaxin as needed 4. Mood:Prozac 20 mg daily at bedtime, Wellbutrin 150 mg twice a day. Provide emotional support 5. Neuropsych: This patientiscapable of making decisions on herown behalf. 6. Skin/Wound Care:Routine skin checks 7. Fluids/Electrolytes/Nutrition:Routine in and out's with follow-up chemistries 8. Acute blood loss anemia with black tarry stools. Follow-up CBC 9.  Ulcerated esophagitis with superficial ulcer. Endoscopy complete 04/16/2018. Continue PPI. Follow-up GI as needed 10. Atrial fibrillation. Cardiac rate controlled. Continue Eliquis/Betapace 120 mg every 12, Toprol 25 mg twice a day Vitals:   04/20/18 1917 04/21/18 0300  BP: (!) 141/87 (!) 159/72  Pulse: (!) 54 (!) 52  Resp: 16 17  Temp: 98.9 F (37.2 C) 97.8 F (36.6 C)  SpO2: 96% 96%  BP  Elevated- on amlodipine at home will resume low dose 11. Diabetes mellitus with peripheral neuropathy. Hemoglobin A1c 7.9.Lantus5units QHS. Check blood sugars before meals and at bedtime. Diabetic teaching CBG (last 3)  Recent Labs    04/20/18 1827 04/20/18 2137 04/21/18 0631  GLUCAP 189* 128* 128*  controlled 12/13 12. Diastolic congestive heart failure. Continue Lasix as directed . Monitor for any signs of fluid overload. 13. CKD III. Follow-up chemistries. Monitor closely while on diuretic, creat 1.4 improved/baseline 14.CAD with stenting. No chest pain. 15. Hypothyroidism. Tapazole 10 mg daily- f/u Endo Dr Loanne Drilling 16. Hyperlipidemia. Lipitor/fenofibrate.    LOS: 1 days A FACE TO FACE EVALUATION WAS PERFORMED  Charlett Blake 04/21/2018, 8:34 AM

## 2018-04-21 NOTE — Evaluation (Signed)
Physical Therapy Assessment and Plan  Patient Details  Name: Judith [name] MRN: [id number] Date of Birth: 1950-08-02  PT Diagnosis: Abnormality of gait, Difficulty walking and Muscle weakness Rehab Potential: Excellent ELOS: 10-12 days    Today's Date: 04/21/2018 PT Individual Time: 1000-1100 PT Individual Time Calculation (min): 60 min    Problem List:  Patient Active Problem List   Diagnosis Date Noted  . Amputation of right lower extremity below knee ([place]) 04/20/2018  . 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 Post-operative pain   . PAF (paroxysmal atrial fibrillation) ([place])   . Duodenal ulcer   . Atrial fibrillation with RVR ([place]) 04/14/2018  . Foot osteomyelitis, right ([place]) 04/14/2018  . Cutaneous abscess of left foot   . Acute respiratory failure with hypoxia ([place])   . Cellulitis 01/02/2018  . LGI bleed   . Acute blood loss anemia   . Wide-complex tachycardia ([place]) 07/04/2017  . Type II diabetes mellitus, uncontrolled ([place]) 07/04/2017  . Peripheral neuropathy 07/04/2017  . H/O hyperthyroidism 07/04/2017  . S/P transmetatarsal amputation of foot, right ([place]) 04/19/2016  . Obstructive sleep apnea 11/26/2015  . Bilateral carpal tunnel syndrome 11/26/2015  . Hypomagnesemia 11/16/2015  . Hyperlipidemia LDL goal <70 10/13/2015  . Abnormality of gait 09/02/2015  . Memory loss 08/12/2015  . Diabetic peripheral neuropathy ([place]) 08/12/2015  . Vitamin D deficiency 08/12/2015  . Hyperthyroidism 04/30/2015  . Heme positive stool   . Persistent atrial fibrillation 04/29/2015  . Coronary artery disease with stable angina pectoris ([place]) 03/29/2015  . Abnormal nuclear stress test   . History of goiter 09/28/2014  . GERD (gastroesophageal reflux disease) 07/31/2013  . Depression with anxiety 06/01/2013  . DM (diabetes mellitus), type 2 with peripheral vascular complications ([place]) 62/70/3500  . HTN (hypertension)  01/30/2013    Past Medical History:  Past Medical History:  Diagnosis Date  . Abnormal EKG 07/31/2013  . Arthritis    "hands" (03/06/2015)  . Asthma   . Carpal tunnel syndrome, bilateral   . Chronic kidney disease   . Complication of anesthesia    slow to wake up. Pt sts she woke up during surgery many years ago.  . Coronary artery disease     2 v CAD with CTO of the RCA and high grade bifurcational LCx/OM stenosis. S/P PCI DES x 2 to the LCx/OM.  Marland Kitchen Diabetic peripheral neuropathy ([place]) "since 1996"  . GERD (gastroesophageal reflux disease)   . Goiter   . Headache    migraines prior to menopause  . History of shingles 06/01/2013  . Hyperlipidemia LDL goal <70 10/13/2015  . Hypertension   . Hyperthyroidism   . Osteomyelitis of foot (H[facility])    Right  . PAF (paroxysmal atrial fibrillation) ([place]) 04/29/2015   CHADS2VASC score of 5 now on Apixaban  . Pneumonia ~ 1976  . Sleep apnea    Bipap  . Tremors of nervous system   . Type II diabetes mellitus ([facility])    insulin dependent   Past Surgical History:  Past Surgical History:  Procedure Laterality Date  . ABDOMINAL HYSTERECTOMY  1988   age 56; CERVICAL DYSPLASIA; ovaries intact.   . AMPUTATION Right 01/23/2016   Procedure: Right 3rd Ray Amputation;  Surgeon: [name], MD;  Location: [facility];  Service: Orthopedics;  Laterality: Right;  . AMPUTATION Right 02/13/2016   Procedure: Right Transmetatarsal Amputation;  Surgeon: [name], MD;  Location: [facility];  Service:  Orthopedics;  Laterality: Right;  . AMPUTATION Right 04/15/2018   Procedure: AMPUTATION BELOW KNEE;  Surgeon: [name], MD;  Location: [facility];  Service: Orthopedics;  Laterality: Right;  . BIOPSY  04/16/2018   Procedure: BIOPSY;  Surgeon: Irene Shipper, MD;  Location: [facility] ENDOSCOPY;  Service: Endoscopy;;  . CARDIAC CATHETERIZATION N/A 02/27/2015   Procedure: Left Heart Cath and Coronary Angiography;  Surgeon: [name], MD; LAD 40%, mCFX 80%, OM 70%, RCA 100%  calcified       . CARDIAC CATHETERIZATION N/A 03/06/2015   Procedure: Coronary Stent Intervention;  Surgeon: [name], MD;  Location: [facility];  Service: Cardiovascular;  Laterality: N/A;  Mid CX 3.50x12 promus DES w/ 0% resdual and Prox OM1 2.50x20 promus DES w/ 20% residual  . CARDIOVERSION    . CARPAL TUNNEL RELEASE Right Nov 2015  . CARPAL TUNNEL RELEASE Right 1992; 05/2014   [name]; [place]  . CESAREAN SECTION  1982; 1984  . ESOPHAGOGASTRODUODENOSCOPY (EGD) WITH PROPOFOL N/A 04/16/2018   Procedure: ESOPHAGOGASTRODUODENOSCOPY (EGD) WITH PROPOFOL;  Surgeon: Irene Shipper, MD;  Location: [facility] ENDOSCOPY;  Service: Endoscopy;  Laterality: N/A;  . FOOT NEUROMA SURGERY Bilateral 2000  . I&D EXTREMITY Left 01/06/2018   Procedure: DEBRIDEMENT ULCER LEFT FOOT;  Surgeon: [name], MD;  Location: [facility];  Service: Orthopedics;  Laterality: Left;  . KNEE ARTHROSCOPY Right ~ 2003   "meniscus repair"  . SHOULDER OPEN ROTATOR CUFF REPAIR Right 1996; 1998   "w/fracture repair"  . THYROID SURGERY  2000   "removed lots of nodules"  . TONSILLECTOMY  1976    Assessment & Plan Clinical Impression: Judith [name] is a 67 year old right-handed female with history of sleep apnea/CPAP,diastolic congestive heart failure, CAD with stenting, diabetes mellitus with peripheral neuropathy, CKD stage III,hypertension, remote tobacco abuse 3 years ago, PAF maintained on Eliquis, PVD with history of right toe amputations. Per chart review and patient, patient lives with her sister. One level home with ramped entrance. There is a laundry in the basement. Patient reportedly independent prior to admission using a walker but sometimes needed a wheelchair. Her sister can provide assistance as needed. Presented 04/14/2018 with ulcer of the right foot with ischemic changes and no relief with conservative care. 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 Sharol Given. Wound VAC applied. Hospital course  pain management. Gastroenterology consulted Dr. Henrene Pastor for anemia with black tarry stools. Hemoglobin decreased to 7.5 she was transfused. A recent CT of the abdomen September 2019 for nausea vomiting unremarkable. Gastric emptying scan 02/14/2018 normal study. Endoscopy completed 04/16/2018 significant for ulcerated esophagitis and superficial ulcer. Advise continue PPI. Acute hypoxic respiratory failure secondary to volume overload evident by chest x-ray she was diuresed.Chronic Eliquis has been resumed. Therapy evaluations completed with recommendations of physical medicine rehabilitation consult.Patient was admitted for a comprehensive rehabilitation program Patient transferred to CIR on 04/20/2018 .   Patient currently requires Min-Mod assist  with mobility secondary to muscle weakness, decreased cardiorespiratoy endurance, decreased coordination and decreased standing balance, decreased balance strategies and difficulty maintaining precautions.  Prior to hospitalization, patient was independent  with mobility and lived with Family(sister ) in a House home.  Home access is  Ramped entrance.  Patient will benefit from skilled PT intervention to maximize safe functional mobility, minimize fall risk and decrease caregiver burden for planned discharge home with intermittent assist.  Anticipate patient will benefit from follow up [facility] at discharge.  PT - End of Session Activity Tolerance: Tolerates  30+ min activity with multiple rests Endurance Deficit: Yes Endurance Deficit Description: generalized deconditioning PT Assessment Rehab Potential (ACUTE/IP ONLY): Excellent PT Patient demonstrates impairments in the following area(s): Balance;Safety;Skin Integrity;Endurance;Motor;Pain PT Transfers Functional Problem(s): Bed Mobility;Bed to Chair;Car;Furniture;Floor PT Locomotion Functional Problem(s): Ambulation;Wheelchair Mobility;Stairs PT Plan PT Intensity: Minimum of 1-2 x/day ,45 to 90 minutes PT  Frequency: 5 out of 7 days PT Duration Estimated Length of Stay: 10-12 days  PT Treatment/Interventions: Ambulation/gait training;Community reintegration;DME/adaptive equipment instruction;Neuromuscular re-education;Psychosocial support;Stair training;UE/LE Strength taining/ROM;Wheelchair propulsion/positioning;Balance/vestibular training;Discharge planning;Functional electrical stimulation;Pain management;Skin care/wound management;Therapeutic Activities;UE/LE Coordination activities;Cognitive remediation/compensation;Disease management/prevention;Functional mobility training;Patient/family education;Splinting/orthotics;Therapeutic Exercise;Visual/perceptual remediation/compensation PT Transfers Anticipated Outcome(s): distant S for transfers, LRAD  PT Locomotion Anticipated Outcome(s): min guard, RW  PT Recommendation Recommendations for Other Services: Therapeutic Recreation consult Therapeutic Recreation Interventions: Pet therapy;Kitchen group;Stress management;Outing/community reintergration Follow Up Recommendations: Home health PT Patient destination: Home Equipment Recommended: Wheelchair (measurements);Wheelchair cushion (measurements);Rolling walker with 5" wheels;Tub/shower bench  Skilled Therapeutic Intervention Patient received [facility] in [facility], very pleasant and motivated to participate in therapy today. Discussed general therapy routine and expectations during CIR stay, then initiated POC and treatment. Able to self-propel WC approximately 102f with B UEs and L LE before becoming fatigued, then [facility] Able to perform sit to stand with MinA and RW, but required ModA for safe transfer to mat table with RW due to impaired safety and balance. Able to perform bed mobility with min guard today, and again required MinA for sit to stand/ModA for safe transfer back to WC. Able to complete car transfer with [place]. Attempted gait but patient declined due to fatigue.  SpO2 maintained at 99% on room air during session. Unable to assess gait over even and uneven surfaces, stairs, and picking up item due to fatigue. She was returned to her room and [facility] and seat belt alarm activated.   PT Evaluation Precautions/Restrictions Precautions Precautions: Fall Precaution Comments: right BKA, wound vac Required Braces or Orthoses: Other Brace Other Brace: limb guard Restrictions Weight Bearing Restrictions: Yes RLE Weight Bearing: Non weight bearing General Chart Reviewed: Yes Response to Previous Treatment: Patient with no complaints from previous session. Family/Caregiver Present: No Vital SignsTherapy Vitals Temp: 98.6 F (37 C) Pulse Rate: (!) 53 Resp: 16 BP: (!) 128/51 Patient Position (if appropriate): Lying Oxygen Therapy SpO2: 99 % O2 Device: Room Air Pain Pain Assessment Pain Scale: 0-10 Pain Score: 0-No pain Home Living/Prior Functioning Home Living Available Help at Discharge: Family;Available PRN/intermittently Type of Home: House Home Access: Ramped entrance Home Layout: Laundry or work area in basement;One level Bathroom Shower/Tub: WMultimedia programmer Standard Bathroom Accessibility: No  Lives With: FDance movement psychotherapist) Prior Function Level of Independence: Independent with basic ADLs;Requires assistive device for independence  Able to Take Stairs?: No Driving: Yes 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  Leisure: Hobbies-yes (Comment) Comments: reports independence with mobility and ADL's within last month, watches tv Vision/Perception  Perception Perception: Within Functional Limits Praxis Praxis: Intact  Cognition Overall Cognitive Status: Impaired/Different from baseline Arousal/Alertness: Awake/alert Orientation Level: Oriented X4 Attention: Selective Selective Attention: Appears intact Memory: Impaired Memory  Impairment: Decreased short term memory Awareness: Appears intact Problem Solving: Appears intact Safety/Judgment: Impaired Comments: as fatigue increases, so does impulsivity and need for cues for safety  Sensation Sensation Light Touch: Impaired Detail Light Touch Impaired Details: Impaired LLE Hot/Cold: Not tested Proprioception: Appears Intact Stereognosis: Appears Intact  Additional Comments: some numbness on the lateral aspect of the L foot, sole of foot intact Coordination Gross Motor Movements are Fluid and Coordinated: Yes Fine Motor Movements are Fluid and Coordinated: No Motor  Motor Motor: Other (comment) Motor - Skilled Clinical Observations: generalized weakness  Mobility Bed Mobility Bed Mobility: Rolling Right;Rolling Left;Supine to Sit;Sit to Supine Rolling Right: Independent Rolling Left: Independent Supine to Sit: Contact Guard/Touching assist Sit to Supine: Contact Guard/Touching assist Transfers Transfers: Sit to Stand;Stand to Sit;Stand Pivot Transfers Sit to Stand: Minimal Assistance - Patient > 75% Stand to Sit: Minimal Assistance - Patient > 75% Stand Pivot Transfers: Moderate Assistance - Patient 50 - 74% Stand Pivot Transfer Details: Verbal cues for sequencing;Verbal cues for safe use of DME/AE;Tactile cues for posture Transfer (Assistive device): Rolling walker Locomotion  Gait Ambulation: No(limited by fatigue ) Gait Gait: No(limited by fatigue ) Stairs / Additional Locomotion Stairs: No(limited by fatigue ) Wheelchair Mobility Wheelchair Mobility: Yes Wheelchair Assistance: Supervision/Verbal cueing Wheelchair Propulsion: Both upper extremities;Left lower extremity Wheelchair Parts Management: Needs assistance Distance: 63f, limited by fatigue   Trunk/Postural Assessment  Cervical Assessment Cervical Assessment: Exceptions to WFL(rounded shoulders ) Thoracic Assessment Thoracic Assessment: Within Functional Limits Lumbar  Assessment Lumbar Assessment: Exceptions to WFL(posterior pelvic tilt ) Postural Control Postural Control: Within Functional Limits  Balance Balance Balance Assessed: Yes Static Sitting Balance Static Sitting - Balance Support: Bilateral upper extremity supported Static Sitting - Level of Assistance: 5: Stand by assistance Dynamic Sitting Balance Dynamic Sitting - Balance Support: Bilateral upper extremity supported Dynamic Sitting - Level of Assistance: 5: Stand by assistance Static Standing Balance Static Standing - Balance Support: During functional activity;Bilateral upper extremity supported Static Standing - Level of Assistance: 4: Min assist Dynamic Standing Balance Dynamic Standing - Balance Support: Bilateral upper extremity supported Dynamic Standing - Level of Assistance: 3: Mod assist Extremity Assessment  RUE Assessment RUE Assessment: Exceptions to WDign[facility] CampusActive Range of Motion (AROM) Comments: WFL General Strength Comments: R rotator cuff tear per pt report (was scheduled for sx) no MMT LUE Assessment LUE Assessment: Within Functional Limits RLE Assessment RLE Assessment: Exceptions to WRobert [facility]. Bush Naval HospitalGeneral Strength Comments: BKA side- generalized weakness noted in glutes and hip abductors  LLE Assessment LLE Assessment: Exceptions to WSt. Joseph Regional Medical CenterGeneral Strength Comments: gross weakness in LE musculature, easily fatiguable     Refer to Care Plan for Long Term Goals  Recommendations for other services: Therapeutic Recreation  Pet therapy, Kitchen group, Stress management and Outing/community reintegration  Discharge Criteria: Patient will be discharged from PT if patient refuses treatment 3 consecutive times without medical reason, if treatment goals not met, if there is a change in medical status, if patient makes no progress towards goals or if patient is discharged from hospital.  The above assessment, treatment plan, treatment alternatives and goals were discussed and mutually  agreed upon: by patient  KDeniece ReePT, DPT, CBIS  Supplemental Physical Therapist [facility]   Pager [phone] Rehab Office [phone]

## 2018-04-21 NOTE — Evaluation (Signed)
Occupational Therapy Assessment and Plan  Patient Details  Name: Judith [name] MRN: [id number] Date of Birth: 16-May-1950  OT Diagnosis: muscle weakness (generalized) Rehab Potential: Rehab Potential (ACUTE ONLY): Good ELOS: 10-12 days   Today's Date: 04/21/2018 OT Individual Time: 0017-4944 OT Individual Time Calculation (min): 75 min     Problem List:  Patient Active Problem List   Diagnosis Date Noted  . Amputation of right lower extremity below knee ([place]) 04/20/2018  . 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 Post-operative pain   . PAF (paroxysmal atrial fibrillation) ([place])   . Duodenal ulcer   . Atrial fibrillation with RVR ([place]) 04/14/2018  . Foot osteomyelitis, right ([place]) 04/14/2018  . Cutaneous abscess of left foot   . Acute respiratory failure with hypoxia ([place])   . Cellulitis 01/02/2018  . LGI bleed   . Acute blood loss anemia   . Wide-complex tachycardia ([place]) 07/04/2017  . Type II diabetes mellitus, uncontrolled ([place]) 07/04/2017  . Peripheral neuropathy 07/04/2017  . H/O hyperthyroidism 07/04/2017  . S/P transmetatarsal amputation of foot, right ([place]) 04/19/2016  . Obstructive sleep apnea 11/26/2015  . Bilateral carpal tunnel syndrome 11/26/2015  . Hypomagnesemia 11/16/2015  . Hyperlipidemia LDL goal <70 10/13/2015  . Abnormality of gait 09/02/2015  . Memory loss 08/12/2015  . Diabetic peripheral neuropathy ([place]) 08/12/2015  . Vitamin D deficiency 08/12/2015  . Hyperthyroidism 04/30/2015  . Heme positive stool   . Persistent atrial fibrillation 04/29/2015  . Coronary artery disease with stable angina pectoris ([place]) 03/29/2015  . Abnormal nuclear stress test   . History of goiter 09/28/2014  . GERD (gastroesophageal reflux disease) 07/31/2013  . Depression with anxiety 06/01/2013  . DM (diabetes mellitus), type 2 with peripheral vascular complications ([place]) 96/75/9163  . HTN (hypertension)  01/30/2013    Past Medical History:  Past Medical History:  Diagnosis Date  . Abnormal EKG 07/31/2013  . Arthritis    "hands" (03/06/2015)  . Asthma   . Carpal tunnel syndrome, bilateral   . Chronic kidney disease   . Complication of anesthesia    slow to wake up. Pt sts she woke up during surgery many years ago.  . Coronary artery disease     2 v CAD with CTO of the RCA and high grade bifurcational LCx/OM stenosis. S/P PCI DES x 2 to the LCx/OM.  Marland Kitchen Diabetic peripheral neuropathy ([place]) "since 1996"  . GERD (gastroesophageal reflux disease)   . Goiter   . Headache    migraines prior to menopause  . History of shingles 06/01/2013  . Hyperlipidemia LDL goal <70 10/13/2015  . Hypertension   . Hyperthyroidism   . Osteomyelitis of foot (HCC)    Right  . PAF (paroxysmal atrial fibrillation) ([place]) 04/29/2015   CHADS2VASC score of 5 now on Apixaban  . Pneumonia ~ 1976  . Sleep apnea    Bipap  . Tremors of nervous system   . Type II diabetes mellitus (HCC)    insulin dependent   Past Surgical History:  Past Surgical History:  Procedure Laterality Date  . ABDOMINAL HYSTERECTOMY  1988   age 69; CERVICAL DYSPLASIA; ovaries intact.   . AMPUTATION Right 01/23/2016   Procedure: Right 3rd Ray Amputation;  Surgeon: [name], MD;  Location: [place];  Service: Orthopedics;  Laterality: Right;  . AMPUTATION Right 02/13/2016   Procedure: Right Transmetatarsal Amputation;  Surgeon: [name], MD;  Location: [place];  Service: Orthopedics;  Laterality: Right;  . AMPUTATION Right 04/15/2018   Procedure: AMPUTATION BELOW KNEE;  Surgeon: [name], MD;  Location: [place];  Service: Orthopedics;  Laterality: Right;  . BIOPSY  04/16/2018   Procedure: BIOPSY;  Surgeon: Irene Shipper, MD;  Location: [facility] ENDOSCOPY;  Service: Endoscopy;;  . CARDIAC CATHETERIZATION N/A 02/27/2015   Procedure: Left Heart Cath and Coronary Angiography;  Surgeon: [name], MD; LAD 40%, mCFX 80%, OM 70%, RCA 100%  calcified       . CARDIAC CATHETERIZATION N/A 03/06/2015   Procedure: Coronary Stent Intervention;  Surgeon: [name], MD;  Location: [place] [facility];  Service: Cardiovascular;  Laterality: N/A;  Mid CX 3.50x12 promus DES w/ 0% resdual and Prox OM1 2.50x20 promus DES w/ 20% residual  . CARDIOVERSION    . CARPAL TUNNEL RELEASE Right Nov 2015  . CARPAL TUNNEL RELEASE Right 1992; 05/2014   [name]; [place]  . CESAREAN SECTION  1982; 1984  . ESOPHAGOGASTRODUODENOSCOPY (EGD) WITH PROPOFOL N/A 04/16/2018   Procedure: ESOPHAGOGASTRODUODENOSCOPY (EGD) WITH PROPOFOL;  Surgeon: Irene Shipper, MD;  Location: [facility] ENDOSCOPY;  Service: Endoscopy;  Laterality: N/A;  . FOOT NEUROMA SURGERY Bilateral 2000  . I&D EXTREMITY Left 01/06/2018   Procedure: DEBRIDEMENT ULCER LEFT FOOT;  Surgeon: [name], MD;  Location: [place] ;  Service: Orthopedics;  Laterality: Left;  . KNEE ARTHROSCOPY Right ~ 2003   "meniscus repair"  . SHOULDER OPEN ROTATOR CUFF REPAIR Right 1996; 1998   "w/fracture repair"  . THYROID SURGERY  2000   "removed lots of nodules"  . TONSILLECTOMY  1976    Assessment & Plan Clinical Impression: Judith [name] is a 67 year old right-handed female with history of sleep apnea/CPAP,diastolic congestive heart failure, CAD with stenting, diabetes mellitus with peripheral neuropathy, CKD stage III,hypertension, remote tobacco abuse 3 years ago, PAF maintained on Eliquis, PVD with history of right toe amputations. Per chart review and patient, patient lives with her sister. One level home with ramped entrance. There is a laundry in the basement. Patient reportedly independent prior to admission using a walker but sometimes needed a wheelchair. Her sister can provide assistance as needed. Presented 04/14/2018 with ulcer of the right foot with ischemic changes and no relief with conservative care. 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 Sharol Given. Wound VAC applied. Hospital course  pain management. Gastroenterology consulted Dr. Henrene Pastor for anemia with black tarry stools. Hemoglobin decreased to 7.5 she was transfused. A recent CT of the abdomen September 2019 for nausea vomiting unremarkable. Gastric emptying scan 02/14/2018 normal study. Endoscopy completed 04/16/2018 significant for ulcerated esophagitis and superficial ulcer. Advise continue PPI. Acute hypoxic respiratory failure secondary to volume overload evident by chest x-ray she was diuresed.Chronic Eliquis has been resumed. Therapy evaluations completed with recommendations of physical medicine rehabilitation consult.Patient was admitted for a comprehensive rehabilitation program Patient transferred to CIR on 04/20/2018 .    Patient currently requires min with basic self-care skills secondary to muscle weakness, decreased cardiorespiratoy endurance, decreased problem solving and decreased sitting balance, decreased standing balance and decreased balance strategies.  Prior to hospitalization, patient could complete ADLs with modified independent .  Patient will benefit from skilled intervention to decrease level of assist with basic self-care skills and increase level of independence with iADL prior to discharge home with care partner.  Anticipate patient will require intermittent supervision and follow up home health.  OT - End of Session Activity Tolerance: Tolerates 10 - 20 min activity with multiple rests Endurance Deficit:  Yes Endurance Deficit Description: generalized deconditioning OT Assessment Rehab Potential (ACUTE ONLY): Good OT Patient demonstrates impairments in the following area(s): Balance;Skin Integrity;Endurance;Pain;Safety;Sensory;Motor;Edema OT Basic ADL's Functional Problem(s): Grooming;Dressing;Bathing;Toileting OT Advanced ADL's Functional Problem(s): Laundry OT Transfers Functional Problem(s): Toilet;Tub/Shower OT Additional Impairment(s): None OT Plan OT Intensity: Minimum of 1-2 x/day, 45  to 90 minutes OT Frequency: 5 out of 7 days OT Duration/Estimated Length of Stay: 10-12 days OT Treatment/Interventions: Disease mangement/prevention;Balance/vestibular training;Self Care/advanced ADL retraining;Therapeutic Exercise;Wheelchair propulsion/positioning;DME/adaptive equipment instruction;Pain management;Community reintegration;Patient/family education;UE/LE Coordination activities;Skin care/wound managment;UE/LE Strength taining/ROM;Therapeutic Activities;Psychosocial support;Functional mobility training;Discharge planning;Splinting/orthotics OT Self Feeding Anticipated Outcome(s): mod I OT Basic Self-Care Anticipated Outcome(s): mod I OT Toileting Anticipated Outcome(s): mod I OT Bathroom Transfers Anticipated Outcome(s): mod I OT Recommendation Recommendations for Other Services: Therapeutic Recreation consult Therapeutic Recreation Interventions: Outing/community reintergration Patient destination: Home Follow Up Recommendations: Home health OT Equipment Recommended: To be determined   Skilled Therapeutic Intervention Skilled OT evaluation completed. Education and discussion with pt re personal goals, OT POC, ELOS, and rehab expectations. Pt completed b/d EOB as described below. Extensive education was provided re R residual limb positioning and process to receiving prosthetic, as pt stated this was a goal of hers. Pt completed SPT to [facility] with min A. Via lateral leans pt able to complete peri hygiene following small BM. Pt stood and completed clothing management with min A. Mod A required to transfer back to EOB. Pt was left sitting up in w/c with all needs met, edu on unit's fall policy and chair alarm belt was fastened.   OT Evaluation Precautions/Restrictions  Precautions Precautions: Fall Precaution Comments: right BKA Required Braces or Orthoses: Other Brace Other Brace: limb guard Restrictions Weight Bearing Restrictions: Yes RLE Weight Bearing: Non weight  bearing General Chart Reviewed: Yes Family/Caregiver Present: No  Pain Pain Assessment Pain Scale: 0-10 Pain Score: 0-No pain Home Living/Prior Functioning Home Living Family/patient expects to be discharged to:: Private residence Living Arrangements: Other relatives(sister not available during day works) Available Help at Discharge: Family, Available PRN/intermittently Type of Home: House Home Access: Ramped entrance Home Layout: Laundry or work area in basement, One level [employer] Shower/Tub: Multimedia programmer: Standard Bathroom Accessibility: No  Lives With: Dance movement psychotherapist) IADL History Homemaking Responsibilities: Yes Meal Prep Responsibility: No Laundry Responsibility: Secondary Cleaning Responsibility: Secondary Bill Paying/Finance Responsibility: Secondary Shopping Responsibility: Secondary Homemaking Comments: Sister [name] doing most Current License: Yes Mode of Transportation: Musician Occupation: Unemployed Prior Function Level of Independence: Independent with basic ADLs, Requires assistive device for independence Driving: Yes Vocation: Unemployed Vocation Requirements: 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  Leisure: Hobbies-yes (Comment) Comments: reports independence with mobility and ADL's within last month, watches tv ADL ADL Eating: Modified independent Where Assessed-Eating: Edge of bed Grooming: Supervision/safety Where Assessed-Grooming: Sitting at sink Upper Body Bathing: Supervision/safety Where Assessed-Upper Body Bathing: Edge of bed Lower Body Bathing: Minimal assistance Where Assessed-Lower Body Bathing: Edge of bed Upper Body Dressing: Supervision/safety Where Assessed-Upper Body Dressing: Edge of bed Lower Body Dressing: Minimal assistance Where Assessed-Lower Body Dressing: Edge of bed Toileting: Minimal assistance Where Assessed-Toileting: Bedside Commode Toilet Transfer: Moderate assistance Toilet  Transfer Method: Stand pivot Toilet Transfer Equipment: Bedside commode Vision Baseline Vision/History: Wears glasses Wears Glasses: At all times Patient Visual Report: No change from baseline Vision Assessment?: No apparent visual deficits Perception  Perception: Within Functional Limits Praxis Praxis: Intact Cognition Overall Cognitive Status: Impaired/Different from baseline Arousal/Alertness: Awake/alert Orientation Level: Person;Place;Situation Person: Oriented Place: Oriented Situation: Oriented Year: 2019 Month: December  Day of Week: Correct Memory: Impaired Memory Impairment: Decreased short term memory Immediate Memory Recall: Sock;Blue;Bed Memory Recall: Sock Memory Recall Sock: Without Cue Attention: Selective Selective Attention: Appears intact Awareness: Appears intact Problem Solving: Appears intact Safety/Judgment: Appears intact Sensation Sensation Light Touch: Impaired Detail Light Touch Impaired Details: Impaired LLE Additional Comments: some numbness on the lateral aspect of the L foot, sole of foot intact Coordination Fine Motor Movements are Fluid and Coordinated: No Motor  Motor Motor: Other (comment) Motor - Skilled Clinical Observations: generalized weakness Mobility  Bed Mobility Bed Mobility: Supine to Sit Supine to Sit: Contact Guard/Touching assist Transfers Sit to Stand: Minimal Assistance - Patient > 75% Stand to Sit: Minimal Assistance - Patient > 75%  Trunk/Postural Assessment  Cervical Assessment Cervical Assessment: Exceptions to WFL(rounded shoulders) Thoracic Assessment Thoracic Assessment: Within Functional Limits Lumbar Assessment Lumbar Assessment: Exceptions to WFL(posterior pelvic tilt) Postural Control Postural Control: Within Functional Limits  Balance Balance Balance Assessed: Yes Static Sitting Balance Static Sitting - Balance Support: Bilateral upper extremity supported Static Sitting - Level of Assistance:  5: Stand by assistance Dynamic Sitting Balance Dynamic Sitting - Balance Support: Bilateral upper extremity supported Dynamic Sitting - Level of Assistance: 5: Stand by assistance Sitting balance - Comments: distal dressing Static Standing Balance Static Standing - Balance Support: During functional activity;Bilateral upper extremity supported Static Standing - Level of Assistance: 4: Min assist Dynamic Standing Balance Dynamic Standing - Balance Support: Bilateral upper extremity supported Dynamic Standing - Level of Assistance: 3: Mod assist Dynamic Standing - Comments: pivoting to [facility] Extremity/Trunk Assessment RUE Assessment RUE Assessment: Exceptions to [facility] Active Range of Motion (AROM) Comments: WFL General Strength Comments: R rotator cuff tear per pt report (was scheduled for sx) no MMT LUE Assessment LUE Assessment: Within Functional Limits     Refer to Care Plan for Long Term Goals  Recommendations for other services: Therapeutic Recreation  Outing/community reintegration   Discharge Criteria: Patient will be discharged from OT if patient refuses treatment 3 consecutive times without medical reason, if treatment goals not met, if there is a change in medical status, if patient makes no progress towards goals or if patient is discharged from hospital.  The above assessment, treatment plan, treatment alternatives and goals were discussed and mutually agreed upon: by patient  Curtis Sites 04/21/2018, 9:29 AM

## 2018-04-21 NOTE — IPOC Note (Signed)
Overall Plan of Care [facility]) Patient Details Name: Judith [name] MRN: [id number] DOB: 01-07-1951  Admitting Diagnosis: <principal problem not specified>  Hospital Problems: Active Problems:   Amputation of right lower extremity below knee ([place])     Functional Problem List: Nursing Skin Integrity, Nutrition, Safety  PT Balance, Safety, Skin Integrity, Endurance, Motor, Pain  OT Balance, Skin Integrity, Endurance, Pain, Safety, Sensory, Motor, Edema  SLP    TR         Basic ADL's: OT Grooming, Dressing, Bathing, Toileting     Advanced  ADL's: OT Laundry     Transfers: PT Bed Mobility, Bed to Chair, Car, [employer], Futures trader, Metallurgist: PT Ambulation, Emergency planning/management officer, Stairs     Additional Impairments: OT None  SLP        TR      Anticipated Outcomes Item Anticipated Outcome  Self Feeding mod I  Swallowing      Basic self-care  mod I  Toileting  mod I   Bathroom Transfers mod I  Bowel/Bladder  to remain continent of bowel and bladder while in rehab with min assist  Transfers  distant S for transfers, LRAD   Locomotion  min guard, RW   Communication     Cognition     Pain  pain goal of less than 3 with prn pain medicaion use  Safety/Judgment  pt will remain fall free while in rehab wiht min assist   Therapy Plan: PT Intensity: Minimum of 1-2 x/day ,45 to 90 minutes PT Frequency: 5 out of 7 days PT Duration Estimated Length of Stay: 10-12 days  OT Intensity: Minimum of 1-2 x/day, 45 to 90 minutes OT Frequency: 5 out of 7 days OT Duration/Estimated Length of Stay: 10-12 days      Team Interventions: Nursing Interventions Patient/Family Education, Pain Management, Skin Care/Wound Management, Psychosocial Support, Disease Management/Prevention, Discharge Planning  PT interventions Ambulation/gait training, Community reintegration, DME/adaptive equipment instruction, Neuromuscular re-education, Psychosocial support, Stair  training, UE/LE Strength taining/ROM, Wheelchair propulsion/positioning, Training and development officer, Discharge planning, Functional electrical stimulation, Pain management, Skin care/wound management, Therapeutic Activities, UE/LE Coordination activities, Cognitive remediation/compensation, Disease management/prevention, Functional mobility training, Patient/family education, Splinting/orthotics, Therapeutic Exercise, Visual/perceptual remediation/compensation  OT Interventions Disease mangement/prevention, Training and development officer, Self Care/advanced ADL retraining, Therapeutic Exercise, Wheelchair propulsion/positioning, DME/adaptive equipment instruction, Pain management, Community reintegration, Barrister's clerk education, UE/LE Coordination activities, Skin care/wound managment, UE/LE Strength taining/ROM, Therapeutic Activities, Psychosocial support, Functional mobility training, Discharge planning, Splinting/orthotics  SLP Interventions    TR Interventions    SW/CM Interventions Discharge Planning, Psychosocial Support, Patient/Family Education   Barriers to Discharge MD  Medical stability and Wound care  Nursing Wound Care, Lack of/limited family support home alone during the day; Pt and [name] live together and [place] works during day   PT      OT      SLP      SW       Team Discharge Planning: Destination: PT-Home ,OT- Home , SLP-  Projected Follow-up: PT-Home health PT, OT-  Home health OT, SLP-  Projected Equipment Needs: PT-Wheelchair (measurements), Wheelchair cushion (measurements), Rolling walker with 5" wheels, Tub/shower bench, OT- To be determined, SLP-  Equipment Details: PT- , OT-  Patient/family involved in discharge planning: PT- Patient,  OT-Patient, SLP-   MD ELOS: 10-14d Medical Rehab Prognosis:  Excellent Assessment:    67 year old right-handed female with history of sleep apnea/CPAP,diastolic congestive heart failure, CAD with stenting, diabetes mellitus with  peripheral neuropathy, CKD  stage III,hypertension, remote tobacco abuse 3 years ago, PAF maintained on Eliquis, PVD with history of right toe amputations. Per chart review and patient, patient lives with her sister. One level home with ramped entrance. There is a laundry in the basement. Patient reportedly independent prior to admission using a walker but sometimes needed a wheelchair. Her sister can provide assistance as needed. Presented 04/14/2018 with ulcer of the right foot with ischemic changes and no relief with conservative care. 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 Sharol Given. Wound VAC applied. Hospital course pain management. Gastroenterology consulted Dr. Henrene Pastor for anemia with black tarry stools. Hemoglobin decreased to 7.5 she was transfused. A recent CT of the abdomen September 2019 for nausea vomiting unremarkable. Gastric emptying scan 02/14/2018 normal study. Endoscopy completed 04/16/2018 significant for ulcerated esophagitis and superficial ulcer. Advise continue PPI. Acute hypoxic respiratory failure secondary to volume overload evident by chest x-ray she was diuresed.Chronic Eliquis has been resumed  Now requiring 24/7 Rehab RN,MD, as well as CIR level PT, OT and SLP.  Treatment team will focus on ADLs and mobility with goals set at Mod I See Team Conference Notes for weekly updates to the plan of care

## 2018-04-21 NOTE — Progress Notes (Signed)
Inpatient Rehabilitation  Patient information reviewed and entered into eRehab system by [facility]. Loni Beckwith., CCC/SLP, PPS Coordinator.  Information including medical coding, functional ability and quality indicators will be reviewed and updated through discharge.    Per nursing patient was given "Data Collection Information Summary" for Patients in Inpatient Rehabilitation Facilities with attached "Privacy Act [place] Records" upon admission, present in patient education notebook.

## 2018-04-21 NOTE — Progress Notes (Signed)
Occupational Therapy Session Note  Patient Details  Name: Judith [name] MRN: [id number] Date of Birth: Jul 24, 1950  Today's Date: 04/21/2018 OT Individual Time: 1330-1430 OT Individual Time Calculation (min): 60 min    Short Term Goals: Week 1:  OT Short Term Goal 1 (Week 1): Pt will complete SPT to [facility] with CGA OT Short Term Goal 2 (Week 1): Pt will don pants with CGA OT Short Term Goal 3 (Week 1): Pt will utilize proper positioning strategies for R residual limb with no more than 1 vc  Skilled Therapeutic Interventions/Progress Updates:    Session focused on proper positioning of R residual limb and ADL transfers. Pt received supine in bed, no c/o pain agreeable to therapy. Pt completed SPT to w/c with mod A. Pt propelled w/c 100 ft with (S), cueing for proper hand placement. Pt transferred to mat and was guided through desensitization techniques including tapping and rubbing distal end of limb. Extended edu provided re residual limb positioning. Pt transitioned to side lying and was guided through R hip extension exercises with tactile and verbal cues for positioning. Pt then transitioned to prone on elbows and was provided with tactile cues for proper knee extension. Pt was returned to her room where she completed toileting tasks with min A and returned supine. Bed alarm set and all needs met.   Therapy Documentation Precautions:  Precautions Precautions: Fall Precaution Comments: right BKA Required Braces or Orthoses: Other Brace Other Brace: limb guard Restrictions Weight Bearing Restrictions: Yes RLE Weight Bearing: Non weight bearing   Vital Signs: Therapy Vitals Temp: 98.6 F (37 C) Pulse Rate: (!) 53 Resp: 16 BP: (!) 128/51 Patient Position (if appropriate): Lying Oxygen Therapy SpO2: 99 % O2 Device: Room Air Pain: Pain Assessment Pain Scale: 0-10 Pain Score: 0-No pain   Therapy/Group: Individual Therapy  Curtis Sites 04/21/2018, 3:33 PM

## 2018-04-21 NOTE — Care Management (Signed)
[facility] Individual Statement of Services  Patient Name:  Judith [name]  Date:  04/21/2018  Welcome to the [facility].  Our goal is to provide you with an individualized program based on your diagnosis and situation, designed to meet your specific needs.  With this comprehensive rehabilitation program, you will be expected to participate in at least 3 hours of rehabilitation therapies Monday-Friday, with modified therapy programming on the weekends.  Your rehabilitation program will include the following services:  Physical Therapy (PT), Occupational Therapy (OT), 24 hour per day rehabilitation nursing, Neuropsychology, Case Management (Social Worker), Rehabilitation Medicine, Nutrition Services and Pharmacy Services  Weekly team conferences will be held on Wednesday to discuss your progress.  Your Social Worker will talk with you frequently to get your input and to update you on team discussions.  Team conferences with you and your family in attendance may also be held.  Expected length of stay: 10-12 days Overall anticipated outcome: Supervision with cueing  Depending on your progress and recovery, your program may change. Your Social Worker will coordinate services and will keep you informed of any changes. Your Social Worker's name and contact numbers are listed  below.  The following services may also be recommended but are not provided by the [facility] will be made to provide these services after discharge if needed.  Arrangements include referral to agencies that provide these services.  Your insurance has been verified to be:  [employer] & medicaid Your primary doctor is:  Grant Fontana  Pertinent information will be shared with your doctor and your insurance  company.  Social Worker:  Ovidio Kin, [place] or ([phone]  Information discussed with and copy given to patient by: Elease Hashimoto, 04/21/2018, 8:55 AM

## 2018-04-22 ENCOUNTER — Inpatient Hospital Stay (HOSPITAL_COMMUNITY): Payer: Medicare PPO

## 2018-04-22 ENCOUNTER — Inpatient Hospital Stay (HOSPITAL_COMMUNITY): Payer: Medicare PPO | Admitting: Physical Therapy

## 2018-04-22 DIAGNOSIS — I739 Peripheral vascular disease, unspecified: Secondary | ICD-10-CM

## 2018-04-22 NOTE — Progress Notes (Signed)
Occupational Therapy Session Note  Patient Details  Name: Judith [name] MRN: [id number] Date of Birth: 1950-08-24  Today's Date: 04/22/2018 OT Individual Time: 1415-1500 OT Individual Time Calculation (min): 45 min    Short Term Goals: Week 1:  OT Short Term Goal 1 (Week 1): Pt will complete SPT to [facility] with CGA OT Short Term Goal 2 (Week 1): Pt will don pants with CGA OT Short Term Goal 3 (Week 1): Pt will utilize proper positioning strategies for R residual limb with no more than 1 vc  Skilled Therapeutic Interventions/Progress Updates:    1;1.pt reporting 5/10 pain in residual limb however it is decreasing d/t reciveing pain medication priorto session. Squat pivot trasnfers with min A and A for set up of w/c. Pt propels w/c with increased time and supervision/VC for steering to tx gym.  With demo cueing/2# dowel rod pt completes 2x15 UB therex for BUE strengthning required for BADLs and functional transfers:  Shoulder press Elbow flex/ext Chest press Shoulder flex/ext Horizontal ab/adduct W/c push ups.   Eited session withpt setaed in w/c, call light in reach and all enedsmet  Therapy Documentation Precautions:  Precautions Precautions: Fall Precaution Comments: right BKA, wound vac Required Braces or Orthoses: Other Brace Other Brace: limb guard Restrictions Weight Bearing Restrictions: Yes RLE Weight Bearing: Non weight bearing General:   Vital Signs:  Pain: Pain Assessment Pain Scale: 0-10 Pain Score: 7  Pain Type: Neuropathic pain;Surgical pain Pain Location: Leg Pain Orientation: Right Pain Descriptors / Indicators: Aching;Burning Pain Frequency: Constant Pain Onset: On-going Patients Stated Pain Goal: 3 Pain Intervention(s): Medication (See eMAR) ADL: ADL Eating: Modified independent Where Assessed-Eating: Edge of bed Grooming: Supervision/safety Where Assessed-Grooming: Sitting at sink Upper Body Bathing: Supervision/safety Where Assessed-Upper  Body Bathing: Edge of bed Lower Body Bathing: Minimal assistance Where Assessed-Lower Body Bathing: Edge of bed Upper Body Dressing: Supervision/safety Where Assessed-Upper Body Dressing: Edge of bed Lower Body Dressing: Minimal assistance Where Assessed-Lower Body Dressing: Edge of bed Toileting: Minimal assistance Where Assessed-Toileting: Bedside Commode Toilet Transfer: Moderate assistance Toilet Transfer Method: Stand pivot Toilet Transfer Equipment: Art gallery manager    Praxis   Exercises:   Other Treatments:     Therapy/Group: Individual Therapy  Tonny Branch 04/22/2018, 2:36 PM

## 2018-04-22 NOTE — Progress Notes (Signed)
Physical Therapy Session Note  Patient Details  Name: LATIQUA DALOIA MRN: [id number] Date of Birth: [birth year]  Today's Date: 04/22/2018 PT Individual Time: 0915-1000 PT Individual Time Calculation (min): 45 min   Short Term Goals: Week 1:  PT Short Term Goal 1 (Week 1): Patient to be able to perform bed mobility with Mod(I)  PT Short Term Goal 2 (Week 1): Patient to perform bed to chair transfers with min guard and RW  PT Short Term Goal 3 (Week 1): Patient to initiate gait training with RW   Skilled Therapeutic Interventions/Progress Updates:   Pt received supine in bed and agreeable to PT. Supine>sit transfer without assist or cues from PT. Squat pivot transfer to [facility] with CGA for safety and min cues for proper UE placement in set up. PT instructed pt in personal care at sink while sitting in [facility] including hair brushing, denture management and upper body dressing. Pt able to don pants with sit<>stand technique and min assist from PT for safety and improved anterior weight shift. Min cues for sit<>stand for proper UE and LE placement. Pt performed WC mobility x 159f with BUE and LLE propulsion. Min cues for doorway management from PT. PT instructed pt in gait training with RW x 361fand mod assist for safety. Max cues for improved use of BUE to lift sound LE off floor to prevent jumping off one leg. Sit<>stand x 4 throughout treatment with min assist and min cues for safety. [facility] and all needs met.         Therapy Documentation Precautions:  Precautions Precautions: Fall Precaution Comments: right BKA, wound vac Required Braces or Orthoses: Other Brace Other Brace: limb guard Restrictions Weight Bearing Restrictions: Yes RLE Weight Bearing: Non weight bearing   Pain: Pain Assessment Pain Scale: 0-10 Pain Score: 7  Pain Type: Neuropathic pain;Surgical pain Pain Location: Leg Pain Orientation: Right Pain  Descriptors / Indicators: Aching;Burning Pain Frequency: Constant Pain Onset: On-going Patients Stated Pain Goal: 3 Pain Intervention(s): Medication (See eMAR)   Therapy/Group: Individual Therapy  AuLorie Phenix2/14/2019, 2:26 PM

## 2018-04-22 NOTE — Progress Notes (Signed)
[place] PHYSICAL MEDICINE & REHABILITATION PROGRESS NOTE   Subjective/Complaints: Ongoing right leg/knee pain. Sleeping soundly when I arrived  ROS: Patient denies fever, rash, sore throat, blurred vision, nausea, vomiting, diarrhea, cough, shortness of breath or chest pain, joint or back pain, headache, or mood change.    Objective:   No results found. Recent Labs    04/21/18 0742  WBC 7.6  HGB 10.8*  HCT 36.1  PLT 589*   Recent Labs    04/20/18 0233 04/21/18 0742  NA 136 137  K 4.3 4.5  CL 99 94*  CO2 28 31  GLUCOSE 211* 155*  BUN 21 18  CREATININE 1.40* 1.10*  CALCIUM 8.7* 9.3    Intake/Output Summary (Last 24 hours) at 04/22/2018 1241 Last data filed at 04/21/2018 1746 Gross per 24 hour  Intake 600 ml  Output -  Net 600 ml     Physical Exam: Vital Signs Blood pressure (!) 154/73, pulse (!) 51, temperature 97.7 F (36.5 C), temperature source Oral, resp. rate 17, height 5\' 4"  (1.626 m), weight 78 kg, SpO2 94 %.  Constitutional: No distress . Vital signs reviewed. HEENT: EOMI, oral membranes moist Neck: supple Cardiovascular: RRR without murmur. No JVD    Respiratory: CTA Bilaterally without wheezes or rales. Normal effort    GI: BS +, non-tender, non-distended  Extremities: No clubbing, cyanosis, or edema Skin:vac in place  Neurologic: Cranial nerves II through XII intact, motor strength is 5/5 in bilateral deltoid, bicep, tricep, grip,Left  hip flexor, knee extensors, ankle dorsiflexor and plantar flexor 4/5 RIght HF---stable Sensory exam normal sensation to light touch and proprioception in bilateral upper and lower extremities Musculoskeletal: Full range of motion in all 4 extremities. No joint swelling    Assessment/Plan: 1. Functional deficits secondary to Right transtibial amputation  which require 3+ hours per day of interdisciplinary therapy in a comprehensive inpatient rehab setting.  Physiatrist is providing close team supervision and  24 hour management of active medical problems listed below.  Physiatrist and rehab team continue to assess barriers to discharge/monitor patient progress toward functional and medical goals  Care Tool:  Bathing    Body parts bathed by patient: Right arm, Left arm, Abdomen, Front perineal area, Buttocks, Face, Left lower leg, Chest, Right upper leg, Left upper leg     Body parts n/a: Right lower leg   Bathing assist Assist Level: Minimal Assistance - Patient > 75%     Upper Body Dressing/Undressing Upper body dressing   What is the patient wearing?: Hospital gown only    Upper body assist Assist Level: Contact Guard/Touching assist    Lower Body Dressing/Undressing Lower body dressing      What is the patient wearing?: Underwear/pull up, Pants     Lower body assist Assist for lower body dressing: Minimal Assistance - Patient > 75%     Toileting Toileting    Toileting assist Assist for toileting: Moderate Assistance - Patient 50 - 74%     Transfers Chair/bed transfer  Transfers assist     Chair/bed transfer assist level: Moderate Assistance - Patient 50 - 74%     Locomotion Ambulation   Ambulation assist   Ambulation activity did not occur: Safety/medical concerns(fatigue )          Walk 10 feet activity   Assist  Walk 10 feet activity did not occur: Safety/medical concerns(fatigue )        Walk 50 feet activity   Assist Walk 50 feet with 2 turns  activity did not occur: Safety/medical concerns(fatigue )         Walk 150 feet activity   Assist Walk 150 feet activity did not occur: Safety/medical concerns(fatigue )         Walk 10 feet on uneven surface  activity   Assist Walk 10 feet on uneven surfaces activity did not occur: Safety/medical concerns(fatigue )         Wheelchair     Assist Will patient use wheelchair at discharge?: Yes Type of Wheelchair: Manual    Wheelchair assist level: Supervision/Verbal  cueing Max wheelchair distance: 75ft     Wheelchair 50 feet with 2 turns activity    Assist        Assist Level: Supervision/Verbal cueing   Wheelchair 150 feet activity     Assist Wheelchair 150 feet activity did not occur: Safety/medical concerns(fatigue )        Medical Problem List and Plan: 1.Decreased functional mobilitysecondary to right transtibial amputation 04/15/2018.Wound VAC 7 days--dc tomorrow? 2. DVT Prophylaxis/Anticoagulation: Eliquis 3. Pain Management:Neurontin 900 mg twice a day, hydrocodone and Robaxin as needed 4. Mood:Prozac 20 mg daily at bedtime, Wellbutrin 150 mg twice a day. Provide emotional support 5. Neuropsych: This patientiscapable of making decisions on herown behalf. 6. Skin/Wound Care:Routine skin checks 7. Fluids/Electrolytes/Nutrition:Routine in and out's with follow-up chemistries 8. Acute blood loss anemia with black tarry stools. Follow-up CBC 9. Ulcerated esophagitis with superficial ulcer. Endoscopy complete 04/16/2018. Continue PPI. Follow-up GI as needed 10. Atrial fibrillation. Cardiac rate controlled. Continue Eliquis/Betapace 120 mg every 12, Toprol 25 mg twice a day Vitals:   04/22/18 0423 04/22/18 0853  BP: (!) 127/40 (!) 154/73  Pulse: (!) 51 (!) 51  Resp: 17   Temp: 97.7 F (36.5 C)   SpO2: 94%   BP with improvement 12/14 11. Diabetes mellitus with peripheral neuropathy. Hemoglobin A1c 7.9.Lantus5units QHS. Check blood sugars before meals and at bedtime. Diabetic teaching CBG (last 3)  Recent Labs    04/20/18 1827 04/20/18 2137 04/21/18 0631  GLUCAP 189* 128* 128*  fair control 12. Diastolic congestive heart failure. Continue Lasix as directed . Monitor for any signs of fluid overload.  -follow weights 13. CKD III. Follow-up chemistries. Monitor closely while on diuretic, creat 1.4 improved/baseline 14.CAD with stenting. No chest pain. 15. Hypothyroidism. Tapazole 10 mg daily- f/u Endo Dr  Loanne Drilling 16. Hyperlipidemia. Lipitor/fenofibrate.    LOS: 2 days A FACE TO FACE EVALUATION WAS PERFORMED  Meredith Staggers 04/22/2018, 12:41 PM

## 2018-04-22 NOTE — Progress Notes (Signed)
Occupational Therapy Session Note  Patient Details  Name: Judith [name] MRN: [id number] Date of Birth: April 11, 1951  Today's Date: 04/22/2018 OT Individual Time: 1130-1200 OT Individual Time Calculation (min): 30 min    Short Term Goals: Week 1:  OT Short Term Goal 1 (Week 1): Pt will complete SPT to [facility] with CGA OT Short Term Goal 2 (Week 1): Pt will don pants with CGA OT Short Term Goal 3 (Week 1): Pt will utilize proper positioning strategies for R residual limb with no more than 1 vc  Skilled Therapeutic Interventions/Progress Updates:    1:1. Pt received with NT exiting room. Pt and NT reporting difficulty with transfer to/from toilet. OT retrieved BSC for over toilet and Pt completes blocked practice of w/c<>BSC transfer with grab bar with MOD A/VC for hand placement fading to min A and no VC for hand placement over 3 trials of transfers. Pt completes 5 sit to stand at sink with VC for hand placement and MIN A. Exited session with pt seated in w/c, exit alarm on, and all needs met.  Therapy Documentation Precautions:  Precautions Precautions: Fall Precaution Comments: right BKA, wound vac Required Braces or Orthoses: Other Brace Other Brace: limb guard Restrictions Weight Bearing Restrictions: Yes RLE Weight Bearing: Non weight bearing General:   Vital Signs: Therapy Vitals Pulse Rate: (!) 51 BP: (!) 154/73 Patient Position (if appropriate): Sitting Pain: Pain Assessment Pain Scale: 0-10 Pain Score: 0-No pain ADL: ADL Eating: Modified independent Where Assessed-Eating: Edge of bed Grooming: Supervision/safety Where Assessed-Grooming: Sitting at sink Upper Body Bathing: Supervision/safety Where Assessed-Upper Body Bathing: Edge of bed Lower Body Bathing: Minimal assistance Where Assessed-Lower Body Bathing: Edge of bed Upper Body Dressing: Supervision/safety Where Assessed-Upper Body Dressing: Edge of bed Lower Body Dressing: Minimal assistance Where  Assessed-Lower Body Dressing: Edge of bed Toileting: Minimal assistance Where Assessed-Toileting: Bedside Commode Toilet Transfer: Moderate assistance Toilet Transfer Method: Stand pivot Toilet Transfer Equipment: Art gallery manager    Praxis   Exercises:   Other Treatments:     Therapy/Group: Individual Therapy  Tonny Branch 04/22/2018, 12:11 PM

## 2018-04-22 NOTE — Progress Notes (Signed)
Physical Therapy Session Note  Patient Details  Name: Judith [name] MRN: [id number] Date of Birth: 1950/11/05  Today's Date: 04/22/2018 PT Individual Time: 1530-1630 PT Individual Time Calculation (min): 60 min   Short Term Goals: Week 1:  PT Short Term Goal 1 (Week 1): Patient to be able to perform bed mobility with Mod(I)  PT Short Term Goal 2 (Week 1): Patient to perform bed to chair transfers with min guard and RW  PT Short Term Goal 3 (Week 1): Patient to initiate gait training with RW   Skilled Therapeutic Interventions/Progress Updates:   Pt in w/c and agreeable to therapy, pain as detailed below. Pt self-propelled w/c to/from therapy gym w/ supervision using BUEs, no rest break needed. Session focused on LE strengthening as detailed below. Intermittent brief rest breaks 2/2 pain w/ movements. Provided education on amputee recovery and importance of maintaining RLE ROM and strength for future prosthetic use. Returned to room via w/c, ended session in w/c and all needs met.   LE strengthening exercises: -R knee bends within available range, 3x10 -R SLR, 3x10 -L supine bridge, 3x10 -R hip abduction in sidelying, 3x10 -R hip extension in sidelying, 3x10  Therapy Documentation Precautions:  Precautions Precautions: Fall Precaution Comments: right BKA, wound vac Required Braces or Orthoses: Other Brace Other Brace: limb guard Restrictions Weight Bearing Restrictions: Yes RLE Weight Bearing: Non weight bearing Vital Signs:  Pain:    Therapy/Group: Individual Therapy  Delcenia Inman Clent Demark 04/22/2018, 8:42 PM

## 2018-04-23 DIAGNOSIS — E119 Type 2 diabetes mellitus without complications: Secondary | ICD-10-CM

## 2018-04-23 LAB — GLUCOSE, CAPILLARY
GLUCOSE-CAPILLARY: 180 mg/dL — AB (ref 70–99)
GLUCOSE-CAPILLARY: 256 mg/dL — AB (ref 70–99)

## 2018-04-23 MED ORDER — INSULIN GLARGINE 100 UNIT/ML ~~LOC~~ SOLN
15.0000 [IU] | Freq: Every day | SUBCUTANEOUS | Status: DC
  Administered 2018-04-23 – 2018-04-27 (×5): 15 [IU] via SUBCUTANEOUS
  Filled 2018-04-23 (×5): qty 0.15

## 2018-04-23 MED ORDER — INSULIN GLARGINE 100 UNIT/ML ~~LOC~~ SOLN: 5.0000 [IU] | Freq: Every day | SUBCUTANEOUS | Status: DC

## 2018-04-23 MED ORDER — INSULIN ASPART 100 UNIT/ML ~~LOC~~ SOLN: 0.0000 [IU] | Freq: Three times a day (TID) | SUBCUTANEOUS | Status: DC

## 2018-04-23 MED ORDER — INSULIN DEGLUDEC 100 UNIT/ML ~~LOC~~ SOPN: 15.0000 [IU] | PEN_INJECTOR | Freq: Every day | SUBCUTANEOUS | Status: DC

## 2018-04-23 NOTE — Progress Notes (Signed)
Wound vac removed. Patient tolerated well. Staples intact, two small areas showing dried sanguinous drainage. Dressing applied as ordered.

## 2018-04-23 NOTE — Progress Notes (Signed)
Patient confirms she cannot take novolog because of allergic reaction. Reported to MD and sliding scale discontinued.

## 2018-04-23 NOTE — Progress Notes (Signed)
Pt has home unit and places self on/off as needed. RT will monitor. 

## 2018-04-23 NOTE — Progress Notes (Signed)
[place] PHYSICAL MEDICINE & REHABILITATION PROGRESS NOTE   Subjective/Complaints: Has occasional shooting pain in stump. Otherwise it's feeling well. Asked why she's not on insulin and why we're not checking cbg's.   ROS: Patient denies fever, rash, sore throat, blurred vision, nausea, vomiting, diarrhea, cough, shortness of breath or chest pain, joint or back pain, headache, or mood change.    Objective:   No results found. Recent Labs    04/21/18 0742  WBC 7.6  HGB 10.8*  HCT 36.1  PLT 589*   Recent Labs    04/21/18 0742  NA 137  K 4.5  CL 94*  CO2 31  GLUCOSE 155*  BUN 18  CREATININE 1.10*  CALCIUM 9.3    Intake/Output Summary (Last 24 hours) at 04/23/2018 1004 Last data filed at 04/23/2018 0900 Gross per 24 hour  Intake 820 ml  Output -  Net 820 ml     Physical Exam: Vital Signs Blood pressure (!) 150/61, pulse (!) 51, temperature 98.4 F (36.9 C), temperature source Oral, resp. rate 18, height 5\' 4"  (1.626 m), weight 78 kg, SpO2 96 %.  Constitutional: No distress . Vital signs reviewed. HEENT: EOMI, oral membranes moist Neck: supple Cardiovascular: RRR without murmur. No JVD    Respiratory: CTA Bilaterally without wheezes or rales. Normal effort    GI: BS +, non-tender, non-distended   Extremities: No clubbing, cyanosis, or edema Skin:vac in place right stump. Leg well formed Neurologic: Cranial nerves II through XII intact, motor strength is 5/5 in bilateral deltoid, bicep, tricep, grip,Left  hip flexor, knee extensors, ankle dorsiflexor and plantar flexor 4/5 RIght HF-no change Sensory exam normal sensation to light touch and proprioception in bilateral upper and lower extremities Musculoskeletal: Full range of motion in all 4 extremities, less RLE. No joint swelling    Assessment/Plan: 1. Functional deficits secondary to Right transtibial amputation  which require 3+ hours per day of interdisciplinary therapy in a comprehensive inpatient  rehab setting.  Physiatrist is providing close team supervision and 24 hour management of active medical problems listed below.  Physiatrist and rehab team continue to assess barriers to discharge/monitor patient progress toward functional and medical goals  Care Tool:  Bathing    Body parts bathed by patient: Right arm, Left arm, Abdomen, Front perineal area, Buttocks, Face, Left lower leg, Chest, Right upper leg, Left upper leg     Body parts n/a: Right lower leg   Bathing assist Assist Level: Minimal Assistance - Patient > 75%     Upper Body Dressing/Undressing Upper body dressing   What is the patient wearing?: Hospital gown only    Upper body assist Assist Level: Contact Guard/Touching assist    Lower Body Dressing/Undressing Lower body dressing      What is the patient wearing?: Underwear/pull up, Pants     Lower body assist Assist for lower body dressing: Minimal Assistance - Patient > 75%     Toileting Toileting    Toileting assist Assist for toileting: Moderate Assistance - Patient 50 - 74%     Transfers Chair/bed transfer  Transfers assist     Chair/bed transfer assist level: Maximal Assistance - Patient 25 - 49%     Locomotion Ambulation   Ambulation assist   Ambulation activity did not occur: Safety/medical concerns(fatigue )  Assist level: Moderate Assistance - Patient 50 - 74% Assistive device: Walker-rolling     Walk 10 feet activity   Assist  Walk 10 feet activity did not occur: Safety/medical concerns  Walk 50 feet activity   Assist Walk 50 feet with 2 turns activity did not occur: Safety/medical concerns         Walk 150 feet activity   Assist Walk 150 feet activity did not occur: Safety/medical concerns         Walk 10 feet on uneven surface  activity   Assist Walk 10 feet on uneven surfaces activity did not occur: Safety/medical concerns         Wheelchair     Assist Will patient use  wheelchair at discharge?: Yes Type of Wheelchair: Manual    Wheelchair assist level: Supervision/Verbal cueing Max wheelchair distance: 150    Wheelchair 50 feet with 2 turns activity    Assist        Assist Level: Supervision/Verbal cueing   Wheelchair 150 feet activity     Assist Wheelchair 150 feet activity did not occur: Safety/medical concerns(fatigue )   Assist Level: Supervision/Verbal cueing    Medical Problem List and Plan: 1.Decreased functional mobilitysecondary to right transtibial amputation 04/15/2018.  -continue CIR 2. DVT Prophylaxis/Anticoagulation: Eliquis 3. Pain Management:Neurontin 900 mg twice a day, hydrocodone and Robaxin as needed 4. Mood:Prozac 20 mg daily at bedtime, Wellbutrin 150 mg twice a day. Provide emotional support 5. Neuropsych: This patientiscapable of making decisions on herown behalf. 6. Skin/Wound Care:Routine skin checks  -remove vac today, non-adherent dressing ordered 7. Fluids/Electrolytes/Nutrition:Routine in and out's with follow-up chemistries 8. Acute blood loss anemia with black tarry stools. Follow-up CBC 9. Ulcerated esophagitis with superficial ulcer. Endoscopy complete 04/16/2018. Continue PPI. Follow-up GI as needed 10. Atrial fibrillation. Cardiac rate controlled. Continue Eliquis/Betapace 120 mg every 12, Toprol 25 mg twice a day Vitals:   04/23/18 0327 04/23/18 0752  BP: (!) 152/54 (!) 150/61  Pulse: (!) 49 (!) 51  Resp: 18   Temp: 98.4 F (36.9 C)   SpO2: 96%   BP with improvement 12/15 11. Diabetes mellitus with peripheral neuropathy. Hemoglobin A1c 7.9.Lantus5units QHS.  -cbg's not being checked---ordered today  CBG (last 3)  Recent Labs    04/20/18 2137 04/21/18 0631 04/23/18 0758  GLUCAP 128* 128* 323*   12. Diastolic congestive heart failure. Continue Lasix as directed . Monitor for any signs of fluid overload.  -need daily  weights  - Filed Weights   04/20/18 1703   Weight: 78 kg    13. CKD III. Follow-up chemistries. Monitor closely while on diuretic, creat 1.4 improved/baseline 14.CAD with stenting. No chest pain. 15. Hypothyroidism. Tapazole 10 mg daily- f/u Endo Dr Loanne Drilling 16. Hyperlipidemia. Lipitor/fenofibrate.    LOS: 3 days A FACE TO [place] 04/23/2018, 10:04 AM

## 2018-04-24 ENCOUNTER — Inpatient Hospital Stay (HOSPITAL_COMMUNITY): Payer: Medicare PPO | Admitting: Occupational Therapy

## 2018-04-24 ENCOUNTER — Inpatient Hospital Stay (HOSPITAL_COMMUNITY): Payer: Medicare PPO | Admitting: Physical Therapy

## 2018-04-24 DIAGNOSIS — I5032 Chronic diastolic (congestive) heart failure: Secondary | ICD-10-CM

## 2018-04-24 DIAGNOSIS — N183 Chronic kidney disease, stage 3 unspecified: Secondary | ICD-10-CM

## 2018-04-24 DIAGNOSIS — I5042 Chronic combined systolic (congestive) and diastolic (congestive) heart failure: Secondary | ICD-10-CM

## 2018-04-24 DIAGNOSIS — I480 Paroxysmal atrial fibrillation: Secondary | ICD-10-CM

## 2018-04-24 DIAGNOSIS — D62 Acute posthemorrhagic anemia: Secondary | ICD-10-CM

## 2018-04-24 DIAGNOSIS — E1142 Type 2 diabetes mellitus with diabetic polyneuropathy: Secondary | ICD-10-CM

## 2018-04-24 LAB — GLUCOSE, CAPILLARY
GLUCOSE-CAPILLARY: 244 mg/dL — AB (ref 70–99)
Glucose-Capillary: 309 mg/dL — ABNORMAL HIGH (ref 70–99)
Glucose-Capillary: 307 mg/dL — ABNORMAL HIGH (ref 70–99)
Glucose-Capillary: 240 mg/dL — ABNORMAL HIGH (ref 70–99)
Glucose-Capillary: 198 mg/dL — ABNORMAL HIGH (ref 70–99)
Glucose-Capillary: 193 mg/dL — ABNORMAL HIGH (ref 70–99)

## 2018-04-24 MED ORDER — AMLODIPINE BESYLATE 5 MG PO TABS
5.0000 mg | ORAL_TABLET | Freq: Once | ORAL | Status: AC
  Administered 2018-04-24: 5 mg via ORAL
  Filled 2018-04-24: qty 1

## 2018-04-24 MED ORDER — AMLODIPINE BESYLATE 5 MG PO TABS
7.5000 mg | ORAL_TABLET | Freq: Every day | ORAL | Status: DC
  Administered 2018-04-25 – 2018-05-02 (×8): 7.5 mg via ORAL
  Filled 2018-04-24 (×8): qty 1

## 2018-04-24 NOTE — Progress Notes (Signed)
Occupational Therapy Session Note  Patient Details  Name: Judith [name] MRN: [id number] Date of Birth: 07-05-1950  Today's Date: 04/24/2018 OT Individual Time: 1400-1500 OT Individual Time Calculation (min): 60 min    Short Term Goals: Week 1:  OT Short Term Goal 1 (Week 1): Pt will complete SPT to [facility] with CGA OT Short Term Goal 2 (Week 1): Pt will don pants with CGA OT Short Term Goal 3 (Week 1): Pt will utilize proper positioning strategies for R residual limb with no more than 1 vc  Skilled Therapeutic Interventions/Progress Updates:    Pt seen for OT ADL bathing/dressing session. Pt sitting up in w/c upon arrival with hand off from PT. Pt denying pain and agreeable to tx session, agreeable to shower. She gathered clothing items from w/c level mod I. Completed stand pivot transfer w/c>tub transfer bench with steadying assist and VCs for hand placement. She bathed with supervision, lateral leans for buttock hygiene. She transferred back to w/c to dress, steadying assist standing at RW to pull pants up. Pt reports having changed shrinker this AM and therefore did not change following shower. She completed grooming tasks standing at sink in order to address functional standing balance and endurance, completed with CGA, tolerating ~1 minute in standing. Completed UE strengthening and w/c management, propelled w/c around unit with supervision. Returned to room at end of session, all needs in reach, completing grooming tasks at sink with chair belt alarm donned.   Therapy Documentation Precautions:  Precautions Precautions: Fall Precaution Comments: right BKA, wound vac Required Braces or Orthoses: Other Brace Other Brace: limb guard Restrictions Weight Bearing Restrictions: Yes RLE Weight Bearing: Non weight bearing Pain:   No/denies pain   Therapy/Group: Individual Therapy  Berwyn Bigley L 04/24/2018, 3:07 PM

## 2018-04-24 NOTE — Progress Notes (Signed)
[facility] PHYSICAL MEDICINE & REHABILITATION PROGRESS NOTE   Subjective/Complaints: Patient seen laying in bed this morning.  She states she slept well overnight.  She states she had a good weekend.  ROS: Denies CP, SOB, N/V/D  Objective:   No results found. No results for input(s): WBC, HGB, HCT, PLT in the last 72 hours. No results for input(s): NA, K, CL, CO2, GLUCOSE, BUN, CREATININE, CALCIUM in the last 72 hours.  Intake/Output Summary (Last 24 hours) at 04/24/2018 1237 Last data filed at 04/24/2018 0830 Gross per 24 hour  Intake 480 ml  Output -  Net 480 ml     Physical Exam: Vital Signs Blood pressure (!) 152/88, pulse (!) 53, temperature 97.7 F (36.5 C), resp. rate 18, height 5\' 4"  (1.626 m), weight 78 kg, SpO2 97 %.  Constitutional: No distress . Vital signs reviewed. HENT: Normocephalic.  Atraumatic. Eyes: EOMI. No discharge. Cardiovascular: RRR. No JVD. Respiratory: CTA Bilaterally. Normal effort. GI: BS +. Non-distended. Musc: Right BKA with edema and tenderness Neurologic: Alert. Motor: 5/5 in bilateral deltoid, bicep, tricep, grip,Left  hip flexor, knee extensors, ankle dorsiflexor and plantar flexor Right hip flexion: 4+/5 Skin: BKA with staples C/D/I  Assessment/Plan: 1. Functional deficits secondary to Right transtibial amputation  which require 3+ hours per day of interdisciplinary therapy in a comprehensive inpatient rehab setting.  Physiatrist is providing close team supervision and 24 hour management of active medical problems listed below.  Physiatrist and rehab team continue to assess barriers to discharge/monitor patient progress toward functional and medical goals  Care Tool:  Bathing    Body parts bathed by patient: Right arm, Left arm, Abdomen, Front perineal area, Buttocks, Face, Left lower leg, Chest, Right upper leg, Left upper leg     Body parts n/a: Right lower leg   Bathing assist Assist Level: Minimal Assistance - Patient >  75%     Upper Body Dressing/Undressing Upper body dressing   What is the patient wearing?: Hospital gown only    Upper body assist Assist Level: Contact Guard/Touching assist    Lower Body Dressing/Undressing Lower body dressing      What is the patient wearing?: Underwear/pull up, Pants     Lower body assist Assist for lower body dressing: Minimal Assistance - Patient > 75%     Toileting Toileting    Toileting assist Assist for toileting: Minimal Assistance - Patient > 75%     Transfers Chair/bed transfer  Transfers assist     Chair/bed transfer assist level: Minimal Assistance - Patient > 75%     Locomotion Ambulation   Ambulation assist   Ambulation activity did not occur: Safety/medical concerns(fatigue )  Assist level: Minimal Assistance - Patient > 75% Assistive device: Walker-rolling Max distance: 5'   Walk 10 feet activity   Assist  Walk 10 feet activity did not occur: Safety/medical concerns        Walk 50 feet activity   Assist Walk 50 feet with 2 turns activity did not occur: Safety/medical concerns         Walk 150 feet activity   Assist Walk 150 feet activity did not occur: Safety/medical concerns         Walk 10 feet on uneven surface  activity   Assist Walk 10 feet on uneven surfaces activity did not occur: Safety/medical concerns         Wheelchair     Assist Will patient use wheelchair at discharge?: Yes Type of Wheelchair: Manual  Wheelchair assist level: Supervision/Verbal cueing Max wheelchair distance: 150    Wheelchair 50 feet with 2 turns activity    Assist        Assist Level: Supervision/Verbal cueing   Wheelchair 150 feet activity     Assist Wheelchair 150 feet activity did not occur: Safety/medical concerns(fatigue )   Assist Level: Supervision/Verbal cueing    Medical Problem List and Plan: 1.Decreased functional mobilitysecondary to right transtibial amputation  04/15/2018.  Continue CIR 2. DVT Prophylaxis/Anticoagulation: Eliquis 3. Pain Management:Neurontin 900 mg twice a day, hydrocodone and Robaxin as needed 4. Mood:Prozac 20 mg daily at bedtime, Wellbutrin 150 mg twice a day. Provide emotional support 5. Neuropsych: This patientiscapable of making decisions on herown behalf. 6. Skin/Wound Care:Routine skin checks 7. Fluids/Electrolytes/Nutrition:Routine in and out's  8. Acute blood loss anemia with black tarry stools.   Hemoglobin 10.8 on 12/13  Continue to monitor 9. Ulcerated esophagitis with superficial ulcer. Endoscopy complete 04/16/2018. Continue PPI. Follow-up GI as needed 10. Atrial fibrillation. Cardiac rate controlled. Continue Eliquis/Betapace 120 mg every 12, Toprol 25 mg twice a day Vitals:   04/24/18 0410 04/24/18 1001  BP: (!) 166/110 (!) 152/88  Pulse: (!) 58 (!) 53  Resp: 18   Temp: 97.7 F (36.5 C)   SpO2: 97%   11. Diabetes mellitus with peripheral neuropathy. Hemoglobin A1c 7.9.Lantus5units QHS.  CBG (last 3)  Recent Labs    04/23/18 2141 04/24/18 0645 04/24/18 1128  GLUCAP 307* 240* 198*   Labile on 12/16 12. Diastolic congestive heart failure. Continue Lasix as directed . Monitor for any signs of fluid overload.  Daily weights ordered Filed Weights   04/20/18 1703  Weight: 78 kg    13. CKD III.  Monitor closely while on diuretic  Creatinine 1.10 on 12/13  Continue to monitor 14.CAD with stenting. No chest pain. 15. Hypothyroidism. Tapazole 10 mg daily- f/u Endo Dr Loanne Drilling 16. Hyperlipidemia. Lipitor/fenofibrate. 17.  Hypertension  Norvasc increased to 7.5 on 12/16    LOS: 4 days A FACE TO FACE EVALUATION WAS PERFORMED  Ankit Lorie Phenix 04/24/2018, 12:37 PM

## 2018-04-24 NOTE — Progress Notes (Addendum)
Physical Therapy Session Note  Patient Details  Name: Judith [name] MRN: [id number] Date of Birth: 06-20-1950  Today's Date: 04/24/2018 PT Individual Time: 1300-1400 PT Individual Time Calculation (min): 60 min   Short Term Goals: Week 1:  PT Short Term Goal 1 (Week 1): Patient to be able to perform bed mobility with Mod(I)  PT Short Term Goal 2 (Week 1): Patient to perform bed to chair transfers with min guard and RW  PT Short Term Goal 3 (Week 1): Patient to initiate gait training with RW   Skilled Therapeutic Interventions/Progress Updates:    Pt received seated in w/c in room, agreeable to PT. Pt reports ongoing phantom limb pain in R residual limb. Education with patient about touching residual limb for phantom limb pain management as well as for desensitization. Manual w/c propulsion x 150 ft with BUE and Supervision. Focus on safe setup of w/c for transfer to therapy mat, pt requires min cueing for safety. Squat pivot transfer w/c to mat table with min A. Sit to stand 3 x 5 reps from progressively lower therapy mat to RW with CGA progressing to min A with decrease in surface height. Ambulation 2 x 5' with RW and min A for balance, focus on increasing use of BUE and elevating shoulders to lift up body rather than "hopping" with LLE. Seated BUE 5# dowel exercises x 15 reps each. Pt left seated in w/c in room with needs in reach, hand-off to OT for next session.  Therapy Documentation Precautions:  Precautions Precautions: Fall Precaution Comments: right BKA, wound vac Required Braces or Orthoses: Other Brace Other Brace: limb guard Restrictions Weight Bearing Restrictions: Yes RLE Weight Bearing: Non weight bearing    Therapy/Group: Individual Therapy  Excell Seltzer, PT, DPT  04/24/2018, 3:00 PM

## 2018-04-24 NOTE — Progress Notes (Signed)
Physical Therapy Session Note  Patient Details  Name: Judith [name] MRN: [id number] Date of Birth: Oct 29, 1950  Today's Date: 04/24/2018 PT Individual Time: 0800-0915 PT Individual Time Calculation (min): 75 min   Short Term Goals: Week 1:  PT Short Term Goal 1 (Week 1): Patient to be able to perform bed mobility with Mod(I)  PT Short Term Goal 2 (Week 1): Patient to perform bed to chair transfers with min guard and RW  PT Short Term Goal 3 (Week 1): Patient to initiate gait training with RW   Skilled Therapeutic Interventions/Progress Updates: Pt received seated on EOB finishing breakfast; denies pain and agreeable to treatment. Pt and therapist donned RLE shrinker with dressing modA to maintain dressing in place. While pt finished breakfast, therapist and pt discussed home setup, equipment needs and follow up therapy. Therapist will recommend HH PT, w/c; pt reports she plans to borrow RW from a friend as she did previously with L foot wound. Reports she had borrowed a w/c from church before but wheels were dry rotted and fell apart when out in community. Reports ramp for home entry, previous w/c unable to fit into bathroom but reports that chair was very wide. Will send home measurement sheet to sister to determine w/c fit with more appropriately fitting chair. Stand pivot no AD bed>w/c with minA. Performed grooming at sink with modI. SetupA for footwear; cues to lock w/c brakes prior to reaching outside BOS to get things out of drawers or to don L shoe with LLE across R knee. W/c propulsion to gym with BUE and S for strengthening and aerobic endurance. Therapist obtained smaller chair and different amputee pad for improved fit and comfort. Squat pivot old w/c>new w/c with minA. Requires increased time to manage leg rests. Gait x5' with RW and min guard/minA. Supine bridging, straight leg raise, sidelying hip abduction each 2x15 reps. Returned to w/c with S squat pivot; transported to room totalA for  energy/time conservation. Remained in w/c, lap belt alarm intact and all needs in reach at completion of session.      Therapy Documentation Precautions:  Precautions Precautions: Fall Precaution Comments: right BKA, wound vac Required Braces or Orthoses: Other Brace Other Brace: limb guard Restrictions Weight Bearing Restrictions: Yes RLE Weight Bearing: Non weight bearing    Therapy/Group: Individual Therapy  Corliss Skains 04/24/2018, 9:38 AM

## 2018-04-25 ENCOUNTER — Inpatient Hospital Stay (HOSPITAL_COMMUNITY): Payer: Medicare PPO

## 2018-04-25 ENCOUNTER — Inpatient Hospital Stay (HOSPITAL_COMMUNITY): Payer: Medicare PPO | Admitting: Physical Therapy

## 2018-04-25 DIAGNOSIS — R7309 Other abnormal glucose: Secondary | ICD-10-CM

## 2018-04-25 DIAGNOSIS — R0989 Other specified symptoms and signs involving the circulatory and respiratory systems: Secondary | ICD-10-CM

## 2018-04-25 LAB — GLUCOSE, CAPILLARY
Glucose-Capillary: 120 mg/dL — ABNORMAL HIGH (ref 70–99)
Glucose-Capillary: 343 mg/dL — ABNORMAL HIGH (ref 70–99)
Glucose-Capillary: 219 mg/dL — ABNORMAL HIGH (ref 70–99)

## 2018-04-25 NOTE — Plan of Care (Signed)
  Problem: Consults Goal: RH LIMB LOSS PATIENT EDUCATION Description Description: See Patient Education module for eduction specifics. Outcome: Progressing Goal: Skin Care Protocol Initiated - if Braden Score 18 or less Description If consults are not indicated, leave blank or document N/A Outcome: Progressing   Problem: RH BOWEL ELIMINATION Goal: RH STG MANAGE BOWEL WITH ASSISTANCE Description STG Manage Bowel with [place].  Outcome: Progressing Goal: RH STG MANAGE BOWEL W/MEDICATION W/ASSISTANCE Description STG Manage Bowel with Medication with Assistance. Min  Outcome: Progressing   Problem: RH BLADDER ELIMINATION Goal: RH STG MANAGE BLADDER WITH ASSISTANCE Description STG Manage Bladder With  Min Assistance  Outcome: Progressing   Problem: RH SKIN INTEGRITY Goal: RH STG SKIN FREE OF INFECTION/BREAKDOWN Description Skin free of breakdown and infection with min assist  Outcome: Progressing Goal: RH STG MAINTAIN SKIN INTEGRITY WITH ASSISTANCE Description STG Maintain Skin Integrity With [place].  Outcome: Progressing Goal: RH STG ABLE TO PERFORM INCISION/WOUND CARE W/ASSISTANCE Description STG Able To Perform Incision/Wound Care With Mod Assistance.  Outcome: Progressing   Problem: RH SAFETY Goal: RH STG ADHERE TO SAFETY PRECAUTIONS W/ASSISTANCE/DEVICE Description STG Adhere to Safety Precautions With Min  Assistance/Device.  Outcome: Progressing Goal: RH STG DECREASED RISK OF FALL WITH ASSISTANCE Description STG Decreased Risk of Fall With Assistance. min  Outcome: Progressing   Problem: RH PAIN MANAGEMENT Goal: RH STG PAIN MANAGED AT OR BELOW PT'S PAIN GOAL Description <= 3/10  Outcome: Progressing   Problem: RH KNOWLEDGE DEFICIT LIMB LOSS Goal: RH STG INCREASE KNOWLEDGE OF SELF CARE AFTER LIMB LOSS Description Patient will be able to demonstrate care of residual limb with cues/supervision. Will be able to describe pain management  strategies with cues/handouts  Outcome: Progressing

## 2018-04-25 NOTE — Progress Notes (Signed)
Occupational Therapy Session Note  Patient Details  Name: Judith [name] MRN: [id number] Date of Birth: 12/14/1950  Today's Date: 04/25/2018 OT Individual Time: 1470-9295 OT Individual Time Calculation (min): 75 min    Short Term Goals: Week 1:  OT Short Term Goal 1 (Week 1): Pt will complete SPT to [facility] with CGA OT Short Term Goal 2 (Week 1): Pt will don pants with CGA OT Short Term Goal 3 (Week 1): Pt will utilize proper positioning strategies for R residual limb with no more than 1 vc  Skilled Therapeutic Interventions/Progress Updates:    Pt received sitting EOB with no c/o pain. Inspection of residual limb, very little yellow discharge on gauze. Pt completed SPT to w/c with CGA. Pt verbalizing w/c positioning with min A for managing leg rests. Pt transferred into shower with CGA and completed all bathing with lateral leans on TTB. Pt donned shirt/bra with set up. CGA for standing to complete LB dressing. Mod A to don limb protector. Edu provided re care for residual limb and purpose of limb protector. Memory deficits noted with pt claiming she had never worn sleeve before (this OT donned prior). Vc provided for RW use during standing. Pt completed grooming tasks at sink with set up. Pt was brought down to therapy gym and given brief demonstration re the use of mirror therapy for phantom limb pain. Pt was returned to room and left sitting up with chair alarm belt fastened.   Therapy Documentation Precautions:  Precautions Precautions: Fall Precaution Comments: right BKA, wound vac Required Braces or Orthoses: Other Brace Other Brace: limb guard Restrictions Weight Bearing Restrictions: Yes RLE Weight Bearing: Non weight bearing    Pain:  No pain reported   Therapy/Group: Individual Therapy  Curtis Sites 04/25/2018, 10:28 AM

## 2018-04-25 NOTE — Progress Notes (Signed)
Physical Therapy Session Note  Patient Details  Name: Judith [name] MRN: [id number] Date of Birth: [birth year]  Today's Date: 04/25/2018 PT Individual Time: 0900-1000 PT Individual Time Calculation (min): 60 min   Short Term Goals: Week 1:  PT Short Term Goal 1 (Week 1): Patient to be able to perform bed mobility with Mod(I)  PT Short Term Goal 2 (Week 1): Patient to perform bed to chair transfers with min guard and RW  PT Short Term Goal 3 (Week 1): Patient to initiate gait training with RW   Skilled Therapeutic Interventions/Progress Updates: Pt received in w/c, denies pain "feels wonderful" following mirror therapy with OT. W/c management within room to obtain needed items. W/c propulsion x150' with BUE and S. Min guard squat pivot w/c <>nustep. Performed BUE/LLE nustep level 5 with average 30 steps/min; following first several minutes pt opted to perform UEs only as she feels her arms are weaker than her legs and would like to focus more on them. Gait x5' with RW and min guard; decreased foot clearance with increased impact compared to yesterdays session, suspect d/t UE fatigue. Demo'ed correct turning technique prior to sitting to increase safety and reduce sheering forces on intact limb. Pt returned demonstration with stand pivot to w/c, min guard. Returned to room S w/c propulsion x150'. Squat pivot w/c <>bed at height of where pt estimates bed height at home, min guard overall with moderate cueing for w/c setup for safety/energy efficiency. Remained in w/c at end of session, lap belt alarm intact and all needs in reach.      Therapy Documentation Precautions:  Precautions Precautions: Fall Precaution Comments: right BKA, wound vac Required Braces or Orthoses: Other Brace Other Brace: limb guard Restrictions Weight Bearing Restrictions: Yes RLE Weight Bearing: Non weight bearing    Therapy/Group: Individual Therapy  Corliss Skains 04/25/2018, 10:00 AM

## 2018-04-25 NOTE — Progress Notes (Signed)
Judith [name] PHYSICAL MEDICINE & REHABILITATION PROGRESS NOTE   Subjective/Complaints: Patient seen laying in bed this morning.  She states she slept well overnight after taking a pain pill.  She requests a grounds pass.  ROS: Denies CP, SOB, N/V/D  Objective:   No results found. No results for input(s): WBC, HGB, HCT, PLT in the last 72 hours. No results for input(s): NA, K, CL, CO2, GLUCOSE, BUN, CREATININE, CALCIUM in the last 72 hours.  Intake/Output Summary (Last 24 hours) at 04/25/2018 1000 Last data filed at 04/25/2018 0800 Gross per 24 hour  Intake 680 ml  Output -  Net 680 ml     Physical Exam: Vital Signs Blood pressure (!) 130/91, pulse (!) 50, temperature 98.1 F (36.7 C), temperature source Oral, resp. rate 18, height 5\' 4"  (1.626 m), weight 71.1 kg, SpO2 99 %.  Constitutional: No distress . Vital signs reviewed. HENT: Normocephalic.  Atraumatic. Eyes: EOMI. No discharge. Cardiovascular: RRR.  No JVD. Respiratory: CTA bilaterally.  Normal effort. GI: BS +. Non-distended. Musc: Right BKA with edema and tenderness Neurologic: Alert. Motor: 5/5 in bilateral deltoid, bicep, tricep, grip,Left  hip flexor, knee extensors, ankle dorsiflexor and plantar flexor, stable Right hip flexion: 4+/5, stable Skin: BKA with dressing C/D/I  Assessment/Plan: 1. Functional deficits secondary to Right transtibial amputation  which require 3+ hours per day of interdisciplinary therapy in a comprehensive inpatient rehab setting.  Physiatrist is providing close team supervision and 24 hour management of active medical problems listed below.  Physiatrist and rehab team continue to assess barriers to discharge/monitor patient progress toward functional and medical goals  Care Tool:  Bathing    Body parts bathed by patient: Right arm, Left arm, Abdomen, Front perineal area, Buttocks, Face, Left lower leg, Chest, Right upper leg, Left upper leg     Body parts n/a: Right lower leg    Bathing assist Assist Level: Supervision/Verbal cueing     Upper Body Dressing/Undressing Upper body dressing   What is the patient wearing?: Pull over shirt, Bra    Upper body assist Assist Level: Set up assist    Lower Body Dressing/Undressing Lower body dressing      What is the patient wearing?: Underwear/pull up, Pants     Lower body assist Assist for lower body dressing: Contact Guard/Touching assist     Toileting Toileting    Toileting assist Assist for toileting: Minimal Assistance - Patient > 75%     Transfers Chair/bed transfer  Transfers assist     Chair/bed transfer assist level: Contact Guard/Touching assist Chair/bed transfer assistive device: Armrests   Locomotion Ambulation   Ambulation assist   Ambulation activity did not occur: Safety/medical concerns(fatigue )  Assist level: Minimal Assistance - Patient > 75% Assistive device: Walker-rolling Max distance: 5'   Walk 10 feet activity   Assist  Walk 10 feet activity did not occur: Safety/medical concerns        Walk 50 feet activity   Assist Walk 50 feet with 2 turns activity did not occur: Safety/medical concerns         Walk 150 feet activity   Assist Walk 150 feet activity did not occur: Safety/medical concerns         Walk 10 feet on uneven surface  activity   Assist Walk 10 feet on uneven surfaces activity did not occur: Safety/medical concerns         Wheelchair     Assist Will patient use wheelchair at discharge?: Yes Type of Wheelchair:  Manual    Wheelchair assist level: Supervision/Verbal cueing Max wheelchair distance: 150    Wheelchair 50 feet with 2 turns activity    Assist        Assist Level: Supervision/Verbal cueing   Wheelchair 150 feet activity     Assist Wheelchair 150 feet activity did not occur: Safety/medical concerns(fatigue )   Assist Level: Supervision/Verbal cueing    Medical Problem List and  Plan: 1.Decreased functional mobilitysecondary to right transtibial amputation 04/15/2018.  Continue CIR 2. DVT Prophylaxis/Anticoagulation: Eliquis 3. Pain Management:Neurontin 900 mg twice a day, hydrocodone and Robaxin as needed 4. Mood:Prozac 20 mg daily at bedtime, Wellbutrin 150 mg twice a day. Provide emotional support 5. Neuropsych: This patientiscapable of making decisions on herown behalf. 6. Skin/Wound Care:Routine skin checks 7. Fluids/Electrolytes/Nutrition:Routine in and out's  8. Acute blood loss anemia with black tarry stools.   Hemoglobin 10.8 on 12/13  Continue to monitor 9. Ulcerated esophagitis with superficial ulcer. Endoscopy complete 04/16/2018. Continue PPI. Follow-up GI as needed 10. Atrial fibrillation. Cardiac rate controlled. Continue Eliquis/Betapace 120 mg every 12, Toprol 25 mg twice a day Vitals:   04/24/18 1938 04/25/18 0420  BP: (!) 146/64 (!) 130/91  Pulse: (!) 56 (!) 50  Resp: 20 18  Temp: 97.8 F (36.6 C) 98.1 F (36.7 C)  SpO2: 98% 99%  11. Diabetes mellitus with peripheral neuropathy. Hemoglobin A1c 7.9.Lantus15units QHS.  CBG (last 3)  Recent Labs    04/24/18 1646 04/24/18 2117 04/25/18 0629  GLUCAP 193* 244* 120*   Labile on 12/17  Will consider further medication adjustments tomorrow 12. Diastolic congestive heart failure. Continue Lasix as directed . Monitor for any signs of fluid overload.  Daily weights ordered Filed Weights   04/20/18 1703 04/25/18 0420  Weight: 78 kg 71.1 kg    13. CKD III.  Monitor closely while on diuretic  Creatinine 1.10 on 12/13  Continue to monitor 14.CAD with stenting. No chest pain. 15. Hypothyroidism. Tapazole 10 mg daily- f/u Endo Dr Loanne Drilling 16. Hyperlipidemia. Lipitor/fenofibrate. 17.  Hypertension  Norvasc increased to 7.5 on 12/16  Labile on 12/17  Continue to monitor    LOS: 5 days A FACE TO FACE EVALUATION WAS PERFORMED  Judith [name] Judith [name] 04/25/2018, 10:00 AM

## 2018-04-25 NOTE — Progress Notes (Signed)
Physical Therapy Session Note  Patient Details  Name: Judith [name] MRN: [id number] Date of Birth: [birth year]  Today's Date: 04/25/2018 PT Individual Time: 1440-1530 PT Individual Time Calculation (min): 50 min   Short Term Goals: Week 1:  PT Short Term Goal 1 (Week 1): Patient to be able to perform bed mobility with Mod(I)  PT Short Term Goal 2 (Week 1): Patient to perform bed to chair transfers with min guard and RW  PT Short Term Goal 3 (Week 1): Patient to initiate gait training with RW   Skilled Therapeutic Interventions/Progress Updates:    Patient received at Christmas party, very pleasant and willing to participate in therapy but needing a bit of extra time to finish cleaning up from activities at party; able to propel WC with set up assist with B UEs, and gave patient WC gloves to assist in ease of propelling chair today as well. Spent time working on activities related to [facility] management, VC required as patient continues to have trouble putting on leg rests and does require cues for brake locking during seated activities. Otherwise worked on head-hips relationship for functional transfers today, with patient initially performing sit to stand with increased effort and MinA fading to S and VC with blocked practice of technique. She does continue to have some difficulty with maintaining balance and safety with stand-pivot transfers with RW this session though, with poor eccentric control noted with return to chair. She was able to self-propel WC back to her room and was left up in [facility] with all needs met and seat belt alarm active.   Therapy Documentation Precautions:  Precautions Precautions: Fall Precaution Comments: right BKA, wound vac Required Braces or Orthoses: Other Brace Other Brace: limb guard Restrictions Weight Bearing Restrictions: Yes RLE Weight Bearing: Non weight bearing General: PT Amount of Missed Time (min): 10 Minutes PT Missed Treatment Reason: Other  (Comment)(patient finishing up at [place] party ) Pain: Pain Assessment Pain Scale: 0-10 Pain Score: 0-No pain    Therapy/Group: Individual Therapy  Deniece Ree PT, DPT, CBIS  Supplemental Physical Therapist [facility]    Pager [phone] Acute Rehab Office [phone]   04/25/2018, 3:54 PM

## 2018-04-26 ENCOUNTER — Inpatient Hospital Stay (HOSPITAL_COMMUNITY): Payer: Medicare PPO | Admitting: Occupational Therapy

## 2018-04-26 ENCOUNTER — Inpatient Hospital Stay (HOSPITAL_COMMUNITY): Payer: Medicare PPO | Admitting: Physical Therapy

## 2018-04-26 DIAGNOSIS — W19XXXA Unspecified fall, initial encounter: Secondary | ICD-10-CM

## 2018-04-26 LAB — GLUCOSE, CAPILLARY
GLUCOSE-CAPILLARY: 300 mg/dL — AB (ref 70–99)
Glucose-Capillary: 353 mg/dL — ABNORMAL HIGH (ref 70–99)
Glucose-Capillary: 210 mg/dL — ABNORMAL HIGH (ref 70–99)
Glucose-Capillary: 281 mg/dL — ABNORMAL HIGH (ref 70–99)

## 2018-04-26 NOTE — Progress Notes (Signed)
Was the fall witnessed: no  Patient condition before and after the fall:A & O times 4, no visible injuries, no c/o pain  Patient's reaction to the fall: patient laughed. She stated that she did not realize that she fell until the bed alarm went off. She was asleep & had been dreaming.  Name of the doctor that was notified including date and time: Reesa Chew  Any interventions and vital signs: Vitals WNL, follow protocol, obtain a high low bed, place floor mats, bed rail up while in bed

## 2018-04-26 NOTE — Plan of Care (Signed)
Pt's goals downgraded to supervision overall due to pt progress. Shower and laundry goals d/c due to pt home accessibility. See POC for goal details. Susy Placzek, OTR/L

## 2018-04-26 NOTE — Patient Care Conference (Signed)
Inpatient RehabilitationTeam Conference and Plan of Care Update Date: 04/26/2018   Time: 2:10 PM    Patient Name: Judith [name]      Medical Record Number: [id number]  Date of Birth: 06-Sep-1950 Sex: Female         Room/Bed: 4M01C/4M01C-01 Payor Info: Payor: [employer] MEDICARE / Plan: [employer] MEDICARE CHOICE PPO / Product Type: *No Product type* /    Admitting Diagnosis: bka  Admit Date/Time:  04/20/2018  4:35 PM Admission Comments: No comment available   Primary Diagnosis:  <principal problem not specified> Principal Problem: <principal problem not specified>  Patient Active Problem List   Diagnosis Date Noted  . Fall   . Labile blood glucose   . Labile blood pressure   . Chronic combined systolic and diastolic congestive heart failure ([place])   . Chronic diastolic congestive heart failure ([place])   . CKD (chronic kidney disease), stage III ([place])   . Type 2 diabetes mellitus with peripheral neuropathy (H[facility])   . Amputation of right lower extremity below knee ([place]) 04/20/2018  . 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 Post-operative pain   . PAF (paroxysmal atrial fibrillation) ([place])   . Duodenal ulcer   . Atrial fibrillation with RVR ([place]) 04/14/2018  . Foot osteomyelitis, right ([place]) 04/14/2018  . Cutaneous abscess of left foot   . Acute respiratory failure with hypoxia ([place])   . Cellulitis 01/02/2018  . LGI bleed   . Acute blood loss anemia   . Wide-complex tachycardia ([place]) 07/04/2017  . Type II diabetes mellitus, uncontrolled ([place]) 07/04/2017  . Peripheral neuropathy 07/04/2017  . H/O hyperthyroidism 07/04/2017  . S/P transmetatarsal amputation of foot, right ([place]) 04/19/2016  . Obstructive sleep apnea 11/26/2015  . Bilateral carpal tunnel syndrome 11/26/2015  . Hypomagnesemia 11/16/2015  . Hyperlipidemia LDL goal <70 10/13/2015  . Abnormality of gait 09/02/2015  . Memory loss 08/12/2015  . Diabetic peripheral  neuropathy ([place]) 08/12/2015  . Vitamin D deficiency 08/12/2015  . Hyperthyroidism 04/30/2015  . Heme positive stool   . Persistent atrial fibrillation 04/29/2015  . Coronary artery disease with stable angina pectoris ([place]) 03/29/2015  . Abnormal nuclear stress test   . History of goiter 09/28/2014  . GERD (gastroesophageal reflux disease) 07/31/2013  . Depression with anxiety 06/01/2013  . DM (diabetes mellitus), type 2 with peripheral vascular complications ([place]) 25/42/7062  . HTN (hypertension) 01/30/2013    Expected Discharge Date: Expected Discharge Date: 05/02/18  Team Members Present: Physician leading conference: Dr. Delice Lesch Social Worker Present: Ovidio Kin, LCSW Nurse Present: Rayetta Pigg, RN PT Present: Kennyth Lose, PT OT Present: Amy Rounds, OT SLP Present: Windell Moulding, SLP PPS Coordinator present : Daiva Nakayama, RN, CRRN;Melissa Gertie Fey     Current Status/Progress Goal Weekly Team Focus  Medical   Decreased functional mobility secondary to right transtibial amputation 04/15/2018.  Improve mobility, CBGs, BP, stump  See above   Bowel/Bladder   Continent of bowel and bladder. LBM 04/25/18  Remain continent of bowel and bladder  Assist as needed   Swallow/Nutrition/ Hydration             ADL's   Steadying assist overall for bathing/dressing at sit>stand level. Min A functional transfers  Set at mod I, will likely downgrade to supervision for safety  ADL/IADL re-training, functional transfers, d/c planning   Mobility   min guard transfers, gait up to 5' with RW  S overall w/c level, min guard for gait  downgraded to 15'; d/c floor transfer goal  strengthening, endurance, amputee education   Communication             Safety/Cognition/ Behavioral Observations            Pain   Complains of phantom pain on right leg. Treating with tylenol and hydrocodone.  Keep pain below pain level of 3.  Assess and treat as needed.   Skin   Right BKA surgical site.   No signs of infection, skin breakdown or swelling.  Assess skin qshift.      *See Care Plan and progress notes for long and short-term goals.     Barriers to Discharge  Current Status/Progress Possible Resolutions Date Resolved   Physician    Decreased caregiver support;Medical stability;Wound Care;Weight bearing restrictions     See above  Therapies, follow stump after fall, optimize DM/BP meds      Nursing                  PT                    OT                  SLP                SW                Discharge Planning/Teaching Needs:  Home with sister who can provide care for a short time then pt will be alone whiel she is working      Team Discussion:  Progressing toward her goals of supervision-min assist ambulation had to downgrade goals due to will not reach mod/i due to safety issues. Working on endurance and activity tolerance. MD working on BS and HTN. Started on HTN meds today.   Revisions to Treatment Plan:  DC 12/24    Continued Need for Acute Rehabilitation Level of Care: The patient requires daily medical management by a physician with specialized training in physical medicine and rehabilitation for the following conditions: Daily direction of a multidisciplinary physical rehabilitation program to ensure safe treatment while eliciting the highest outcome that is of practical value to the patient.: Yes Daily medical management of patient stability for increased activity during participation in an intensive rehabilitation regime.: Yes Daily analysis of laboratory values and/or radiology reports with any subsequent need for medication adjustment of medical intervention for : Post surgical problems;Cardiac problems;Diabetes problems;Blood pressure problems;Wound care problems   I attest that I was present, lead the team conference, and concur with the assessment and plan of the team.   Elease Hashimoto 04/26/2018, 3:00 PM

## 2018-04-26 NOTE — Progress Notes (Signed)
Physical Therapy Session Note  Patient Details  Name: AUBREANNA PERCLE MRN: [id number] Date of Birth: 02-06-1951  Today's Date: 04/26/2018 PT Individual Time: 1015-1045 PT Individual Time Calculation (min): 30 min   Short Term Goals: Week 1:  PT Short Term Goal 1 (Week 1): Patient to be able to perform bed mobility with Mod(I)  PT Short Term Goal 2 (Week 1): Patient to perform bed to chair transfers with min guard and RW  PT Short Term Goal 3 (Week 1): Patient to initiate gait training with RW   Skilled Therapeutic Interventions/Progress Updates:    Pt received seated in w/c donning R residual limb support with setup A. Pt reports ongoing residual limb pain in RLE, discussed techniques for managing phantom pain. Manual w/c propulsion 2 x 150 ft with BUE and Supervision. Pt is Supervision for setting w/c up for safe transfer w/c to mat. Squat pivot transfer CGA. Sit to/from supine with Supervision on flat mat table. Supine BLE strengthening therex x 10-15 reps. Squat pivot transfer back to w/c with CGA. Pt left seated in w/c in room with needs in reach, chair alarm in place.  Therapy Documentation Precautions:  Precautions Precautions: Fall Precaution Comments: right BKA, wound vac Required Braces or Orthoses: Other Brace Other Brace: limb guard Restrictions Weight Bearing Restrictions: Yes RLE Weight Bearing: Non weight bearing    Therapy/Group: Individual Therapy   Excell Seltzer, PT, DPT  04/26/2018, 12:19 PM

## 2018-04-26 NOTE — Progress Notes (Signed)
Physical Therapy Session Note  Patient Details  Name: Judith [name] MRN: [id number] Date of Birth: 10-04-1950  Today's Date: 04/26/2018 PT Individual Time: 1430-1530 PT Individual Time Calculation (min): 60 min   Short Term Goals: Week 1:  PT Short Term Goal 1 (Week 1): Patient to be able to perform bed mobility with Mod(I)  PT Short Term Goal 2 (Week 1): Patient to perform bed to chair transfers with min guard and RW  PT Short Term Goal 3 (Week 1): Patient to initiate gait training with RW   Skilled Therapeutic Interventions/Progress Updates: Pt received asleep in bed; easily awoken and reporting fatigued but agreeable to treatment. Supine>sit modI. Squat pivot transfer to w/c with close S. Performed car transfer to estimated van height with min guard, mod cues for hand placement and w/c setup. W/c propulsion up/down ramps, distances >150' with BUE and S; min guard for inclines/declines for safety. Pt maneuvered w/c on/off elevator with increased time. Pt propelled w/c outside with S on level/unlevel surfaces. Transferred to 18x16" w/c for improved fit; therapist added towel roll to amputee pad for level surface. Remained in w/c at end of session, lap belt alarm intact and all needs in reach.      Therapy Documentation Precautions:  Precautions Precautions: Fall Precaution Comments: right BKA, wound vac Required Braces or Orthoses: Other Brace Other Brace: limb guard Restrictions Weight Bearing Restrictions: Yes RLE Weight Bearing: Non weight bearing    Therapy/Group: Individual Therapy  Corliss Skains 04/26/2018, 3:34 PM

## 2018-04-26 NOTE — Progress Notes (Signed)
Occupational Therapy Session Note  Patient Details  Name: Judith [name] MRN: [id number] Date of Birth: 07/17/1950  Today's Date: 04/26/2018 OT Individual Time: 1300-1400 OT Individual Time Calculation (min): 60 min    Short Term Goals: Week 1:  OT Short Term Goal 1 (Week 1): Pt will complete SPT to [facility] with CGA OT Short Term Goal 2 (Week 1): Pt will don pants with CGA OT Short Term Goal 3 (Week 1): Pt will utilize proper positioning strategies for R residual limb with no more than 1 vc  Skilled Therapeutic Interventions/Progress Updates:   Upon entering the room, pt seated in wheelchair with no c/o pain this session. Pt requesting B UE strengthening exercises this session. OT provided paper handout for B UE strengthening with use of orange theraband. OT demonstrated exercises and pt returning demonstrations with min verbal cuing for proper technique. Pt utilized less resistance for R UE secondary to torn rotator cuff per pt report. No pain reported with exercise. Pt remaining seated in wheelchair with call bell and all needed items within reach.    Therapy Documentation Precautions:  Precautions Precautions: Fall Precaution Comments: right BKA, wound vac Required Braces or Orthoses: Other Brace Other Brace: limb guard Restrictions Weight Bearing Restrictions: Yes RLE Weight Bearing: Non weight bearing Vital Signs: Therapy Vitals Temp: 97.8 F (36.6 C) Temp Source: Oral Pulse Rate: (!) 55 Resp: 18 BP: 103/73 Patient Position (if appropriate): Sitting Oxygen Therapy SpO2: 97 % O2 Device: Room Air Pain: Pain Assessment Pain Scale: 0-10 Pain Score: 0-No pain Pain Type: Acute pain Pain Location: Leg Pain Orientation: Right Pain Descriptors / Indicators: Aching Pain Onset: With Activity Patients Stated Pain Goal: 0 Pain Intervention(s): Medication (See eMAR) ADL: ADL Eating: Modified independent Where Assessed-Eating: Edge of bed Grooming: Supervision/safety Where  Assessed-Grooming: Sitting at sink Upper Body Bathing: Supervision/safety Where Assessed-Upper Body Bathing: Edge of bed Lower Body Bathing: Minimal assistance Where Assessed-Lower Body Bathing: Edge of bed Upper Body Dressing: Supervision/safety Where Assessed-Upper Body Dressing: Edge of bed Lower Body Dressing: Minimal assistance Where Assessed-Lower Body Dressing: Edge of bed Toileting: Minimal assistance Where Assessed-Toileting: Bedside Commode Toilet Transfer: Moderate assistance Toilet Transfer Method: Stand pivot Toilet Transfer Equipment: Bedside commode   Therapy/Group: Individual Therapy  Gypsy Decant 04/26/2018, 2:08 PM

## 2018-04-26 NOTE — Progress Notes (Signed)
[place] PHYSICAL MEDICINE & REHABILITATION PROGRESS NOTE   Subjective/Complaints: Patient seen laying in bed this morning.  She slept fairly overnight.  Spoke with nursing and discussed with patient regarding fall overnight.  She states that she had a drean where a person turned into a [place] and she tried to catch him and found herself on the floor falling on her right side, including her stomach.  Neurochecks have been stable.  ROS: Denies CP, SOB, N/V/D  Objective:   No results found. No results for input(s): WBC, HGB, HCT, PLT in the last 72 hours. No results for input(s): NA, K, CL, CO2, GLUCOSE, BUN, CREATININE, CALCIUM in the last 72 hours.  Intake/Output Summary (Last 24 hours) at 04/26/2018 0811 Last data filed at 04/25/2018 1900 Gross per 24 hour  Intake 240 ml  Output -  Net 240 ml     Physical Exam: Vital Signs Blood pressure (!) 153/74, pulse (!) 57, temperature 97.7 F (36.5 C), temperature source Oral, resp. rate 18, height 5\' 4"  (1.626 m), weight 71.1 kg, SpO2 97 %.  Constitutional: No distress . Vital signs reviewed. HENT: Normocephalic.  Atraumatic. Eyes: EOMI. No discharge. Cardiovascular: RRR.  No JVD. Respiratory: CTA bilaterally.  Normal effort. GI: BS +. Non-distended. Musc: Right BKA with edema and tenderness Neurologic: Alert. Motor: 5/5 in bilateral deltoid, bicep, tricep, grip,Left  hip flexor, knee extensors, ankle dorsiflexor and plantar flexor, unchanged Right hip flexion: 4+/5, unchanged Skin: BKA with some erythema along the staple lines, however intact  Assessment/Plan: 1. Functional deficits secondary to Right transtibial amputation  which require 3+ hours per day of interdisciplinary therapy in a comprehensive inpatient rehab setting.  Physiatrist is providing close team supervision and 24 hour management of active medical problems listed below.  Physiatrist and rehab team continue to assess barriers to discharge/monitor patient  progress toward functional and medical goals  Care Tool:  Bathing    Body parts bathed by patient: Right arm, Left arm, Abdomen, Front perineal area, Buttocks, Face, Left lower leg, Chest, Right upper leg, Left upper leg     Body parts n/a: Right lower leg   Bathing assist Assist Level: Supervision/Verbal cueing     Upper Body Dressing/Undressing Upper body dressing   What is the patient wearing?: Pull over shirt, Bra    Upper body assist Assist Level: Set up assist    Lower Body Dressing/Undressing Lower body dressing      What is the patient wearing?: Underwear/pull up, Pants     Lower body assist Assist for lower body dressing: Contact Guard/Touching assist     Toileting Toileting    Toileting assist Assist for toileting: Minimal Assistance - Patient > 75%     Transfers Chair/bed transfer  Transfers assist     Chair/bed transfer assist level: Minimal Assistance - Patient > 75% Chair/bed transfer assistive device: Armrests, Programmer, multimedia   Ambulation assist   Ambulation activity did not occur: Safety/medical concerns(fatigue )  Assist level: Minimal Assistance - Patient > 75% Assistive device: Walker-rolling Max distance: 5'   Walk 10 feet activity   Assist  Walk 10 feet activity did not occur: Safety/medical concerns        Walk 50 feet activity   Assist Walk 50 feet with 2 turns activity did not occur: Safety/medical concerns         Walk 150 feet activity   Assist Walk 150 feet activity did not occur: Safety/medical concerns  Walk 10 feet on uneven surface  activity   Assist Walk 10 feet on uneven surfaces activity did not occur: Safety/medical concerns         Wheelchair     Assist Will patient use wheelchair at discharge?: Yes Type of Wheelchair: Manual    Wheelchair assist level: Supervision/Verbal cueing Max wheelchair distance: 150    Wheelchair 50 feet with 2 turns  activity    Assist        Assist Level: Supervision/Verbal cueing   Wheelchair 150 feet activity     Assist Wheelchair 150 feet activity did not occur: Safety/medical concerns(fatigue )   Assist Level: Supervision/Verbal cueing    Medical Problem List and Plan: 1.Decreased functional mobilitysecondary to right transtibial amputation 04/15/2018.  Continue CIR  May DC neurochecks and will continue to monitor, especially given anticoagulation 2. DVT Prophylaxis/Anticoagulation: Eliquis 3. Pain Management:Neurontin 900 mg twice a day, hydrocodone and Robaxin as needed 4. Mood:Prozac 20 mg daily at bedtime, Wellbutrin 150 mg twice a day. Provide emotional support 5. Neuropsych: This patientiscapable of making decisions on herown behalf. 6. Skin/Wound Care:Routine skin checks  Some erythema along staple lines, however appears intact overall.  We will continue to monitor given fall 7. Fluids/Electrolytes/Nutrition:Routine in and out's  8. Acute blood loss anemia with black tarry stools.   Hemoglobin 10.8 on 12/13  Continue to monitor 9. Ulcerated esophagitis with superficial ulcer. Endoscopy complete 04/16/2018. Continue PPI. Follow-up GI as needed 10. Atrial fibrillation. Cardiac rate controlled. Continue Eliquis/Betapace 120 mg every 12, Toprol 25 mg twice a day Vitals:   04/26/18 0627 04/26/18 0646  BP: (!) 161/71 (!) 153/74  Pulse: (!) 57 (!) 57  Resp: 15 18  Temp: 97.6 F (36.4 C) 97.7 F (36.5 C)  SpO2: 96% 97%  11. Diabetes mellitus with peripheral neuropathy. Hemoglobin A1c 7.9. CBG (last 3)  Recent Labs    04/25/18 1627 04/25/18 2109 04/26/18 0625  GLUCAP 219* 353* 210*   Lantus15units QHS.   ?  Allergy to NovoLog, will follow-up and discuss  Labile and elevated on 12/18  Will consider further medication adjustments tomorrow 12. Diastolic congestive heart failure. Continue Lasix as directed . Monitor for any signs of fluid  overload.  Daily weights ordered Filed Weights   04/20/18 1703 04/25/18 0420  Weight: 78 kg 71.1 kg    13. CKD III.  Monitor closely while on diuretic  Creatinine 1.10 on 12/13  Continue to monitor 14.CAD with stenting. No chest pain. 15. Hypothyroidism. Tapazole 10 mg daily- f/u Endo Dr Loanne Drilling 16. Hyperlipidemia. Lipitor/fenofibrate. 17.  Hypertension  Norvasc increased to 7.5 on 12/16  Elevated on 12/18, will consider further increase tomorrow if persistently elevated.  Continue to monitor    LOS: 6 days A FACE TO FACE EVALUATION WAS PERFORMED  Willa Brocks Lorie Phenix 04/26/2018, 8:11 AM

## 2018-04-26 NOTE — Plan of Care (Signed)
  Problem: RH Floor Transfers Goal: LTG Patient will perform floor transfers w/assist (PT) Description LTG: Patient will perform floor transfers with assistance (PT). Outcome: Not Applicable   Problem: RH Ambulation Goal: LTG Patient will ambulate in controlled environment (PT) Description LTG: Patient will ambulate in a controlled environment, # of feet with assistance (PT). Flowsheets (Taken 04/26/2018 0721) LTG: Ambulation distance in controlled environment: 15' RW (downgraded d/t slow progress) Note:  Downgraded d/t slow progress Goal: LTG Patient will ambulate in home environment (PT) Description LTG: Patient will ambulate in home environment, # of feet with assistance (PT). Flowsheets (Taken 04/26/2018 0721) LTG: Ambulation distance in home environment: 15' RW (downgraded d/t slow progress) Note:  Downgraded d/t slow progress Goal: LTG Patient will ambulate in community environment (PT) Description LTG: Patient will ambulate in community environment, # of feet with assistance (PT). Outcome: Not Applicable Note:  D/c goal d/t slow progress   Problem: RH Wheelchair Mobility Goal: LTG Patient will propel w/c in community environment (PT) Description LTG: Patient will propel wheelchair in community environment, # of feet with assist (PT) Flowsheets (Taken 04/26/2018 0721) HFS:FSELTR w/c distance in community environment: 150' LRAD Note:  Downgraded d/t slow progress

## 2018-04-26 NOTE — Progress Notes (Signed)
Occupational Therapy Session Note  Patient Details  Name: Judith [name] MRN: [id number] Date of Birth: 04-28-1951  Today's Date: 04/26/2018 OT Individual Time: 1104-1200 OT Individual Time Calculation (min): 56 min    Short Term Goals: Week 1:  OT Short Term Goal 1 (Week 1): Pt will complete SPT to [facility] with CGA OT Short Term Goal 2 (Week 1): Pt will don pants with CGA OT Short Term Goal 3 (Week 1): Pt will utilize proper positioning strategies for R residual limb with no more than 1 vc  Skilled Therapeutic Interventions/Progress Updates:    Pt seen for OT session focusing on functional transfers and toileting task. Pt sitting up in w/c upon arrival, provided therapist with home meausurement sheet. According to sheet, pt's w/c to exact width of doorway and unsure if functionally will be able to get into bathroom in w/c. Discussed with pt and recommending use of BSC in order to increase safety and independence with transfers as well as in event pt can't get into bathroom. Initially, pt required max cuing for proper set-up of w/c in prep for transfer. Initially mod A squat pivot, however, after multiple trials, pt able to complete with guarding assist.  Lateral leans and modified stand in order to complete clothing management with steadying assist. Completed w/c<> drop arm BSC x2 during session. Pt then set-up w/c to transfer to EOB with min cuing, transfer completed with guarding assist. Then completed EOB <> drop arm BSC transfer with guarding assist. Pt returned to w/c at end of session, left seated with chair belt alarm donned and set-up with lunch tray. Pt able to don limb guard with supervision. Education provided regarding DME, continuum of care, set-up of equipment, d/c recommendations and d/c planning.   Therapy Documentation Precautions:  Precautions Precautions: Fall Precaution Comments: right BKA, wound vac Required Braces or Orthoses: Other Brace Other Brace: limb  guard Restrictions Weight Bearing Restrictions: Yes RLE Weight Bearing: Non weight bearing Pain: Pain Assessment Pain Scale: 0-10 Pain Score: 0-No pain   Therapy/Group: Individual Therapy  Camilo Mander L 04/26/2018, 7:20 AM

## 2018-04-26 NOTE — Progress Notes (Signed)
Social Work Patient ID: Judith [name], female   DOB: Jun 27, 1950, 67 y.o.   MRN: [id number] Met with pt to discuss team conference goals supervision-min assist and target discharge date 12/24. Discussed having sister come in day of discharge for education in the am. Pt feels this is workable. Pt is pleased she is going home before Christmas and her sister will be off until the [place] Year to assist her. Will work on equipment and follow up therapies.

## 2018-04-27 ENCOUNTER — Inpatient Hospital Stay (HOSPITAL_COMMUNITY): Payer: Medicare PPO | Admitting: Occupational Therapy

## 2018-04-27 ENCOUNTER — Inpatient Hospital Stay (HOSPITAL_COMMUNITY): Payer: Medicare PPO | Admitting: Physical Therapy

## 2018-04-27 DIAGNOSIS — W19XXXD Unspecified fall, subsequent encounter: Secondary | ICD-10-CM

## 2018-04-27 LAB — GLUCOSE, CAPILLARY
Glucose-Capillary: 124 mg/dL — ABNORMAL HIGH (ref 70–99)
Glucose-Capillary: 178 mg/dL — ABNORMAL HIGH (ref 70–99)
Glucose-Capillary: 234 mg/dL — ABNORMAL HIGH (ref 70–99)
Glucose-Capillary: 313 mg/dL — ABNORMAL HIGH (ref 70–99)

## 2018-04-27 MED ORDER — CARBAMIDE PEROXIDE 6.5 % OT SOLN
5.0000 [drp] | Freq: Two times a day (BID) | OTIC | Status: AC
  Administered 2018-04-27 – 2018-04-28 (×4): 5 [drp] via OTIC
  Filled 2018-04-27: qty 15

## 2018-04-27 MED ORDER — INSULIN DEGLUDEC 100 UNIT/ML ~~LOC~~ SOPN: 15.0000 [IU] | PEN_INJECTOR | Freq: Every day | SUBCUTANEOUS | Status: DC

## 2018-04-27 NOTE — Progress Notes (Signed)
Occupational Therapy Session Note  Patient Details  Name: Judith [name] MRN: [id number] Date of Birth: 1950-12-21  Today's Date: 04/27/2018 OT Individual Time: 1225-8346 OT Individual Time Calculation (min): 60 min    Short Term Goals: Week 1:  OT Short Term Goal 1 (Week 1): Pt will complete SPT to [facility] with CGA OT Short Term Goal 2 (Week 1): Pt will don pants with CGA OT Short Term Goal 3 (Week 1): Pt will utilize proper positioning strategies for R residual limb with no more than 1 vc     Skilled Therapeutic Interventions/Progress Updates:    Pt received in w/c and expressed desire to shower today. She was continuing to eat her breakfast. During that time she discussed her home set up.  She can not access her tub in her room, but stated her sister's house also has a bathroom with a walk in shower that has a door that is 30 inch wide.  Discussed the challenges of accessing a walkin with a glass door as pt will have to do a squat pivot. Recommended she have her sister photograph that bathroom set up.  Obtained a tub bench and supplies for pt to use her her next OT session for bathing. Pt completed all grooming and picked out her clothing for the day.   Pt worked on R knee AROM with knee extension 12x for 2 sets. Encouraged pt to continue exercise on her own.   Pt in w/c with belt alarm on and all needs met.    Therapy Documentation Precautions:  Precautions Precautions: Fall Precaution Comments: right BKA, wound vac Required Braces or Orthoses: Other Brace Other Brace: limb guard Restrictions Weight Bearing Restrictions: Yes RLE Weight Bearing: Non weight bearing  Pain: Pain Assessment Pain Scale: 0-10 Pain Score: 0-No pain      Therapy/Group: Individual Therapy  Jshawn Hurta 04/27/2018, 11:32 AM

## 2018-04-27 NOTE — Progress Notes (Signed)
Occupational Therapy Session Note  Patient Details  Name: Judith [name] MRN: [id number] Date of Birth: 1951-04-11  Today's Date: 04/27/2018 OT Individual Time: 0945-1100 OT Individual Time Calculation (min): 75 min    Short Term Goals: Week 1:  OT Short Term Goal 1 (Week 1): Pt will complete SPT to [facility] with CGA OT Short Term Goal 2 (Week 1): Pt will don pants with CGA OT Short Term Goal 3 (Week 1): Pt will utilize proper positioning strategies for R residual limb with no more than 1 vc  Skilled Therapeutic Interventions/Progress Updates:    Pt seen for OT ADL bathing/dressing session. Pt sitting up in w/c upon arrival denying pain and requesting to shower. She had already gathered clothing items and items for shower. She completed squat pivot transfer w/c>padded tub bench with cut out into shower with min A and VCs for hand palcement and technique. She bathed seated on tub transfer bench with supervision, able to cross L LE into figure four position in order to wash L foot. She returned to w/c to dress, completed modified stand/squat in order to pull pants up with steadying assist. RN assessed surgical site of residual limb and pt able to don shrinker and limb guard with supervision. UB dressing completed mod I.  She self propelled w/c to therapy gym mod I. She set-up w/c in prep for transfer with min cuing for removal of B legrests. Squat pivot transfers completed with guarding assist.  Pt positioned in prone on therapy mat for hip extension stretch, education provided regarding purpose and benefits of stretch and encouraged pt to complete at d/c.  Pt returned to w/c and propelled to ADL apartment. Completed squat pivot transfer w/c>low soft surface couch. Min A transitioning to couch, mod A required to return to w/c. Will cont to benefit from practice with transfers of various heights and surfaces.  Pt returned to room at end of session, left seated in w/c with all needs in reach, chair belt  alarm donned and all needs in reach.   Therapy Documentation Precautions:  Precautions Precautions: Fall Precaution Comments: right BKA, wound vac Required Braces or Orthoses: Other Brace Other Brace: limb guard Restrictions Weight Bearing Restrictions: Yes RLE Weight Bearing: Non weight bearing Pain:   No/denies pain   Therapy/Group: Individual Therapy  Refugia Laneve L 04/27/2018, 7:03 AM

## 2018-04-27 NOTE — Progress Notes (Signed)
[facility] for pt home CPAP machine d/t mask has a hole on the side, pt stated leaked a lot last night. [employer] on call said to call tomorrow during business hrs to address this. Contacted Resp at facility for CPAP stated pt would get charged hospital CPAP, could not replace pt mask. Notified PA for instructions, ordered O2 @ 2L/[place] at bedtime and if pt doesn't tolerate to contact Resp for CPAP. Will report to oncoming nurse as well. Will cont to monitor.  Erie Noe, LPN

## 2018-04-27 NOTE — Progress Notes (Signed)
[place] PHYSICAL MEDICINE & REHABILITATION PROGRESS NOTE   Subjective/Complaints: Patient seen laying in bed this morning.  She states she slept well overnight.  She is excited about the fact that she will be discharged before Christmas.  ROS: Denies CP, SOB, N/V/D  Objective:   No results found. No results for input(s): WBC, HGB, HCT, PLT in the last 72 hours. No results for input(s): NA, K, CL, CO2, GLUCOSE, BUN, CREATININE, CALCIUM in the last 72 hours.  Intake/Output Summary (Last 24 hours) at 04/27/2018 0758 Last data filed at 04/26/2018 0831 Gross per 24 hour  Intake 200 ml  Output -  Net 200 ml     Physical Exam: Vital Signs Blood pressure (!) 152/72, pulse (!) 53, temperature 98.2 F (36.8 C), temperature source Oral, resp. rate 16, height 5\' 4"  (1.626 m), weight 71.1 kg, SpO2 100 %.  Constitutional: No distress . Vital signs reviewed. HENT: Normocephalic.  Atraumatic. Eyes: EOMI. No discharge. Cardiovascular: RRR.  No JVD. Respiratory: CTA bilaterally.  Normal effort. GI: BS +. Non-distended. Musc: Right BKA with edema and tenderness Neurologic: Alert. Motor: 5/5 in bilateral deltoid, bicep, tricep, grip,Left  hip flexor, knee extensors, ankle dorsiflexor and plantar flexor, stable Right hip flexion: 4+/5, stable Skin: BKA with dressing C/D/I  Assessment/Plan: 1. Functional deficits secondary to Right transtibial amputation  which require 3+ hours per day of interdisciplinary therapy in a comprehensive inpatient rehab setting.  Physiatrist is providing close team supervision and 24 hour management of active medical problems listed below.  Physiatrist and rehab team continue to assess barriers to discharge/monitor patient progress toward functional and medical goals  Care Tool:  Bathing    Body parts bathed by patient: Right arm, Left arm, Abdomen, Front perineal area, Buttocks, Face, Left lower leg, Chest, Right upper leg, Left upper leg     Body parts  n/a: Right lower leg   Bathing assist Assist Level: Supervision/Verbal cueing     Upper Body Dressing/Undressing Upper body dressing   What is the patient wearing?: Pull over shirt, Bra    Upper body assist Assist Level: Set up assist    Lower Body Dressing/Undressing Lower body dressing      What is the patient wearing?: Underwear/pull up, Pants     Lower body assist Assist for lower body dressing: Contact Guard/Touching assist     Toileting Toileting    Toileting assist Assist for toileting: Contact Guard/Touching assist     Transfers Chair/bed transfer  Transfers assist     Chair/bed transfer assist level: Contact Guard/Touching assist Chair/bed transfer assistive device: Armrests   Locomotion Ambulation   Ambulation assist   Ambulation activity did not occur: Safety/medical concerns(fatigue )  Assist level: Minimal Assistance - Patient > 75% Assistive device: Walker-rolling Max distance: 5'   Walk 10 feet activity   Assist  Walk 10 feet activity did not occur: Safety/medical concerns        Walk 50 feet activity   Assist Walk 50 feet with 2 turns activity did not occur: Safety/medical concerns         Walk 150 feet activity   Assist Walk 150 feet activity did not occur: Safety/medical concerns         Walk 10 feet on uneven surface  activity   Assist Walk 10 feet on uneven surfaces activity did not occur: Safety/medical concerns         Wheelchair     Assist Will patient use wheelchair at discharge?: Yes Type of Wheelchair:  Manual    Wheelchair assist level: Supervision/Verbal cueing Max wheelchair distance: 150    Wheelchair 50 feet with 2 turns activity    Assist        Assist Level: Supervision/Verbal cueing   Wheelchair 150 feet activity     Assist Wheelchair 150 feet activity did not occur: Safety/medical concerns(fatigue )   Assist Level: Supervision/Verbal cueing    Medical Problem List  and Plan: 1.Decreased functional mobilitysecondary to right transtibial amputation 04/15/2018.  Continue CIR 2. DVT Prophylaxis/Anticoagulation: Eliquis 3. Pain Management:Neurontin 900 mg twice a day, hydrocodone and Robaxin as needed 4. Mood:Prozac 20 mg daily at bedtime, Wellbutrin 150 mg twice a day. Provide emotional support 5. Neuropsych: This patientiscapable of making decisions on herown behalf. 6. Skin/Wound Care:Routine skin checks  Some erythema along staple lines, however appears intact overall.  We will continue to monitor 7. Fluids/Electrolytes/Nutrition:Routine in and out's  8. Acute blood loss anemia.   Hemoglobin 10.8 on 12/13  Labs ordered for tomorrow  Continue to monitor 9. Ulcerated esophagitis with superficial ulcer. Endoscopy complete 04/16/2018. Continue PPI. Follow-up GI as needed 10. Atrial fibrillation. Cardiac rate controlled. Continue Eliquis/Betapace 120 mg every 12, Toprol 25 mg twice a day Vitals:   04/26/18 2003 04/27/18 0344  BP: (!) 125/54 (!) 152/72  Pulse: 65 (!) 53  Resp: 19 16  Temp: 97.9 F (36.6 C) 98.2 F (36.8 C)  SpO2: 97% 100%  11. Diabetes mellitus with peripheral neuropathy. Hemoglobin A1c 7.9. CBG (last 3)  Recent Labs    04/26/18 1654 04/26/18 2116 04/27/18 0633  GLUCAP 281* 300* 124*   Lantus15units QHS.   ?  Allergy to NovoLog  Labile and elevated on 12/19  Will consider transitioning to home medication 12. Diastolic congestive heart failure. Continue Lasix as directed . Monitor for any signs of fluid overload.  Daily weights ordered Filed Weights   04/20/18 1703 04/25/18 0420  Weight: 78 kg 71.1 kg    13. CKD III.  Monitor closely while on diuretic  Creatinine 1.10 on 12/13  Labs ordered for tomorrow  Continue to monitor 14.CAD with stenting. No chest pain. 15. Hypothyroidism. Tapazole 10 mg daily- f/u Endo Dr Loanne Drilling 16. Hyperlipidemia. Lipitor/fenofibrate. 17.  Hypertension  Norvasc increased  to 7.5 on 12/16  Labile on 12/19  Continue to monitor    LOS: 7 days A FACE TO FACE EVALUATION WAS PERFORMED  Judith [name] Lorie Phenix 04/27/2018, 7:58 AM

## 2018-04-27 NOTE — Progress Notes (Signed)
Orthopedic Tech Progress Note Patient Details:  Judith [name] [birth year] [id number]  Patient ID: Judith [name], female   DOB: Nov 17, 1950, 67 y.o.   MRN: [id number]   Judith [name] 04/27/2018, 3:46 PM Called [employer] for [facility].

## 2018-04-27 NOTE — Progress Notes (Signed)
Physical Therapy Session Note  Patient Details  Name: Judith [name] MRN: [id number] Date of Birth: 1950-12-01  Today's Date: 04/27/2018 PT Individual Time: 1400-1500 PT Individual Time Calculation (min): 60 min   Short Term Goals: Week 1:  PT Short Term Goal 1 (Week 1): Patient to be able to perform bed mobility with Mod(I)  PT Short Term Goal 2 (Week 1): Patient to perform bed to chair transfers with min guard and RW  PT Short Term Goal 3 (Week 1): Patient to initiate gait training with RW   Skilled Therapeutic Interventions/Progress Updates: Pt received asleep in bed; easily awoken and agreeable to treatment. Supine>sit modI. Transfer to w/c with min guard. Pt reports very discouraged by difficulty with toilet transfer earlier today; states it took two staff members to get her back in w/c and she is very fearful of if that would happen out in the community with her sister. Performed toilet transfers x6 to several different toilets throughout unit; requires moderate cueing for w/c setup and hand placement to increased safety and independence. At end of session, pt continues to report she is nervous about toilet setup in her room which she performed successfully at beginning of session; performed a second time at end of session with min guard. Hand placement beside body to push up, then reach for rails to pivot. Remained seated in w/c with quick release belt intact and all needs in reach.      Therapy Documentation Precautions:  Precautions Precautions: Fall Precaution Comments: right BKA, wound vac Required Braces or Orthoses: Other Brace Other Brace: limb guard Restrictions Weight Bearing Restrictions: Yes RLE Weight Bearing: Non weight bearing Pain: Pain Assessment Pain Score: 0-No pain    Therapy/Group: Individual Therapy  Corliss Skains 04/27/2018, 3:19 PM

## 2018-04-28 ENCOUNTER — Inpatient Hospital Stay (HOSPITAL_COMMUNITY): Payer: Medicare PPO

## 2018-04-28 ENCOUNTER — Inpatient Hospital Stay (HOSPITAL_COMMUNITY): Payer: Medicare PPO | Admitting: Occupational Therapy

## 2018-04-28 DIAGNOSIS — K5901 Slow transit constipation: Secondary | ICD-10-CM

## 2018-04-28 LAB — GLUCOSE, CAPILLARY
Glucose-Capillary: 151 mg/dL — ABNORMAL HIGH (ref 70–99)
Glucose-Capillary: 159 mg/dL — ABNORMAL HIGH (ref 70–99)
Glucose-Capillary: 92 mg/dL (ref 70–99)
Glucose-Capillary: 249 mg/dL — ABNORMAL HIGH (ref 70–99)

## 2018-04-28 MED ORDER — DOCUSATE SODIUM 100 MG PO CAPS
100.0000 mg | ORAL_CAPSULE | Freq: Two times a day (BID) | ORAL | Status: DC
  Administered 2018-04-28 – 2018-05-02 (×7): 100 mg via ORAL
  Filled 2018-04-28 (×8): qty 1

## 2018-04-28 MED ORDER — INSULIN GLARGINE 100 UNIT/ML ~~LOC~~ SOLN: 8.0000 [IU] | Freq: Every day | SUBCUTANEOUS | Status: DC

## 2018-04-28 MED ORDER — INSULIN GLARGINE 100 UNIT/ML ~~LOC~~ SOLN
8.0000 [IU] | Freq: Two times a day (BID) | SUBCUTANEOUS | Status: DC
  Administered 2018-04-28 – 2018-04-30 (×4): 8 [IU] via SUBCUTANEOUS
  Filled 2018-04-28 (×5): qty 0.08

## 2018-04-28 NOTE — Progress Notes (Signed)
Spoke with pt about wearing CPAP. Pt states her mask is broken, and that she did fine with just O2 overnight. Advised pt to notify for RT if any further assistance is needed.

## 2018-04-28 NOTE — Progress Notes (Signed)
[place] PHYSICAL MEDICINE & REHABILITATION PROGRESS NOTE   Subjective/Complaints: Patient seen laying in bed this morning.  She states she slept well overnight.  She does state that she would like her Colace again.  ROS: Denies CP, SOB, N/V/D  Objective:   No results found. No results for input(s): WBC, HGB, HCT, PLT in the last 72 hours. No results for input(s): NA, K, CL, CO2, GLUCOSE, BUN, CREATININE, CALCIUM in the last 72 hours.  Intake/Output Summary (Last 24 hours) at 04/28/2018 0806 Last data filed at 04/27/2018 1730 Gross per 24 hour  Intake 480 ml  Output -  Net 480 ml     Physical Exam: Vital Signs Blood pressure (!) 159/68, pulse (!) 53, temperature 97.8 F (36.6 C), temperature source Oral, resp. rate 15, height 5\' 4"  (1.626 m), weight 52.6 kg, SpO2 100 %.  Constitutional: No distress . Vital signs reviewed. HENT: Normocephalic.  Atraumatic. Eyes: EOMI. No discharge. Cardiovascular: RRR.  No JVD. Respiratory: CTA bilaterally.  Normal effort. GI: BS +. Non-distended. Musc: Right BKA with edema and tenderness Neurologic: Alert. Motor: 5/5 in bilateral deltoid, bicep, tricep, grip,Left  hip flexor, knee extensors, ankle dorsiflexor and plantar flexor, unchanged Right hip flexion: 4+/5, unchanged Skin: BKA with dressing C/D/I  Assessment/Plan: 1. Functional deficits secondary to Right transtibial amputation  which require 3+ hours per day of interdisciplinary therapy in a comprehensive inpatient rehab setting.  Physiatrist is providing close team supervision and 24 hour management of active medical problems listed below.  Physiatrist and rehab team continue to assess barriers to discharge/monitor patient progress toward functional and medical goals  Care Tool:  Bathing    Body parts bathed by patient: Right arm, Left arm, Abdomen, Front perineal area, Buttocks, Face, Left lower leg, Chest, Right upper leg, Left upper leg     Body parts n/a: Right lower  leg   Bathing assist Assist Level: Supervision/Verbal cueing     Upper Body Dressing/Undressing Upper body dressing   What is the patient wearing?: Pull over shirt, Bra    Upper body assist Assist Level: Independent    Lower Body Dressing/Undressing Lower body dressing      What is the patient wearing?: Pants     Lower body assist Assist for lower body dressing: Contact Guard/Touching assist     Toileting Toileting    Toileting assist Assist for toileting: Contact Guard/Touching assist     Transfers Chair/bed transfer  Transfers assist     Chair/bed transfer assist level: Contact Guard/Touching assist Chair/bed transfer assistive device: Armrests   Locomotion Ambulation   Ambulation assist   Ambulation activity did not occur: Safety/medical concerns(fatigue )  Assist level: Minimal Assistance - Patient > 75% Assistive device: Walker-rolling Max distance: 5'   Walk 10 feet activity   Assist  Walk 10 feet activity did not occur: Safety/medical concerns        Walk 50 feet activity   Assist Walk 50 feet with 2 turns activity did not occur: Safety/medical concerns         Walk 150 feet activity   Assist Walk 150 feet activity did not occur: Safety/medical concerns         Walk 10 feet on uneven surface  activity   Assist Walk 10 feet on uneven surfaces activity did not occur: Safety/medical concerns         Wheelchair     Assist Will patient use wheelchair at discharge?: Yes Type of Wheelchair: Manual    Wheelchair assist level:  Supervision/Verbal cueing Max wheelchair distance: 150    Wheelchair 50 feet with 2 turns activity    Assist        Assist Level: Supervision/Verbal cueing   Wheelchair 150 feet activity     Assist Wheelchair 150 feet activity did not occur: Safety/medical concerns(fatigue )   Assist Level: Supervision/Verbal cueing    Medical Problem List and Plan: 1.Decreased functional  mobilitysecondary to right transtibial amputation 04/15/2018.  Continue CIR 2. DVT Prophylaxis/Anticoagulation: Eliquis 3. Pain Management:Neurontin 900 mg twice a day, hydrocodone and Robaxin as needed 4. Mood:Prozac 20 mg daily at bedtime, Wellbutrin 150 mg twice a day. Provide emotional support 5. Neuropsych: This patientiscapable of making decisions on herown behalf. 6. Skin/Wound Care:Routine skin checks  Some erythema along staple lines, however appears intact overall.  We will continue to monitor 7. Fluids/Electrolytes/Nutrition:Routine in and out's  8. Acute blood loss anemia.   Hemoglobin 10.8 on 12/13  Labs ordered for tomorrow  Continue to monitor 9. Ulcerated esophagitis with superficial ulcer. Endoscopy complete 04/16/2018. Continue PPI. Follow-up GI as needed 10. Atrial fibrillation. Cardiac rate controlled. Continue Eliquis/Betapace 120 mg every 12, Toprol 25 mg twice a day Vitals:   04/27/18 2001 04/28/18 0453  BP: 120/65 (!) 159/68  Pulse: (!) 59 (!) 53  Resp: 12 15  Temp: 98.1 F (36.7 C) 97.8 F (36.6 C)  SpO2: 100% 100%  11. Diabetes mellitus with peripheral neuropathy. Hemoglobin A1c 7.9. CBG (last 3)  Recent Labs    04/27/18 1646 04/27/18 2057 04/28/18 0613  GLUCAP 234* 313* 151*   Lantus15units QHS, changed to 8 units twice daily on 12/20.   ?  Allergy to NovoLog  Labile and elevated on 12/20  Will change to home medication discharge 12. Diastolic congestive heart failure. Continue Lasix as directed . Monitor for any signs of fluid overload.  Daily weights ordered Filed Weights   04/20/18 1703 04/25/18 0420 04/28/18 0453  Weight: 78 kg 71.1 kg 52.6 kg    13. CKD III.  Monitor closely while on diuretic  Creatinine 1.10 on 12/13  Labs ordered for tomorrow  Continue to monitor 14.CAD with stenting. No chest pain. 15. Hypothyroidism. Tapazole 10 mg daily- f/u Endo Dr Loanne Drilling 16. Hyperlipidemia. Lipitor/fenofibrate. 17.   Hypertension  Norvasc increased to 7.5 on 12/16  Remains labile on 12/20  Continue to monitor 18.  Constipation  Bowel regimen increased on 12/20    LOS: 8 days A FACE TO FACE EVALUATION WAS PERFORMED   Lorie Phenix 04/28/2018, 8:06 AM

## 2018-04-28 NOTE — Progress Notes (Signed)
Physical Therapy Session Note  Patient Details  Name: Judith [name] MRN: [id number] Date of Birth: [birth year]  Today's Date: 04/28/2018 PT Individual Time: 9935-7017 PT Individual Time Calculation (min): 70 min   Short Term Goals: Week 1:  PT Short Term Goal 1 (Week 1): Patient to be able to perform bed mobility with Mod(I)  PT Short Term Goal 2 (Week 1): Patient to perform bed to chair transfers with min guard and RW  PT Short Term Goal 3 (Week 1): Patient to initiate gait training with RW   Skilled Therapeutic Interventions/Progress Updates:    Pt seated in w/c upon PT arrival, agreeable to therapy tx and denies pain. Pt propelled w/c to the gym x 150 ft with supervision. Pt transferred to mat with CGA, stand pivot. Pt worked on dynamic standing balance with single UE support on RW while tossing horseshoes, x 2 trials CGA. Pt seated edge of mat performed 2 x 10 LAQ for strengthening. Pt performed sit<>Stand with RW and CGA, performed x 5 steps forwards/back in place with RW and min assist. Pt performed squat pivot to w/c with CGA. Pt propelled to the dayroom with supervision x 150 ft. Pt transferred from w/c<>nustep this session with CGA, pt used nustep x 6 minutes on workload 5 for global strengthening and endurance. Pt propelled back to gym and transferred to mat CGA. Pt performed supine LE strengthening exercises including alternating LE hip flexion, SLR, bridges and hip abduction. Pt transported back to room and therapist applied ice to shoulder for pain relief.   Therapy Documentation Precautions:  Precautions Precautions: Fall Precaution Comments: right BKA, wound vac Required Braces or Orthoses: Other Brace Other Brace: limb guard Restrictions Weight Bearing Restrictions: Yes RLE Weight Bearing: Non weight bearing    Therapy/Group: Individual Therapy  Netta Corrigan, PT, DPT 04/28/2018, 7:11 AM

## 2018-04-28 NOTE — Plan of Care (Signed)
  Problem: RH Ambulation Goal: LTG Patient will ambulate in home environment (PT) Description LTG: Patient will ambulate in home environment, # of feet with assistance (PT). Outcome: Not Applicable Flowsheets (Taken 04/28/2018 0826) LTG: Pt will ambulate in home environ  assist needed:: -- (d/c goal; not recommending pt ambulate at home following d/c) Note:  D/c goal, not recommending ambulation at home following d/c

## 2018-04-28 NOTE — Progress Notes (Signed)
Occupational Therapy Weekly Progress Note  Patient Details  Name: Judith [name] MRN: [id number] Date of Birth: 03-23-1951  Beginning of progress report period: April 21, 2018 End of progress report period: April 28, 2018  Today's Date: 04/28/2018 OT Individual Time: 1694-5038 OT Individual Time Calculation (min): 75 min    Patient has met 3 of 3 short term goals.  Pt is making steady progress towards OT goals. She is completing basic transfers via squat pivot with close supervision- guarding assist. Overall requires min cuing for proper set-up of w/c in prep for transfer including placement of w/c and parts management. Recommend pt to complete bathing routine via sponge bath at this time 2/2 w/c being able to fit into home bathroom, pt agreeable. Cont with ongoing amputee education during rehab stay.  Plan for pt's sister, primary caregiver, to come in for caregiver education session prior to pt's d/c home at supervision level.   Patient continues to demonstrate the following deficits: muscle weakness, decreased cardiorespiratoy endurance and decreased sitting balance, decreased standing balance, decreased postural control, decreased balance strategies, difficulty maintaining precautions and new R BKA  and therefore will continue to benefit from skilled OT intervention to enhance overall performance with BADL, iADL and Reduce care partner burden.  Patient progressing toward long term goals..  Plan of care revisions: Goals were downgraded to supervision overall during reporting period as pt requires cues for proper set-up of w/c and parts management during transfers and need for assist during standing/ squating portions of ADL tasks. .  OT Short Term Goals Week 1:  OT Short Term Goal 1 (Week 1): Pt will complete SPT to [facility] with CGA OT Short Term Goal 1 - Progress (Week 1): Met OT Short Term Goal 2 (Week 1): Pt will don pants with CGA OT Short Term Goal 2 - Progress (Week 1): Met OT  Short Term Goal 3 (Week 1): Pt will utilize proper positioning strategies for R residual limb with no more than 1 vc OT Short Term Goal 3 - Progress (Week 1): Met Week 2:  OT Short Term Goal 1 (Week 2): STG=LTG due to LOS  Skilled Therapeutic Interventions/Progress Updates:    Pt seen for OT session focusing on ADL re-training, functional transfers, andw/c accessiblity and maninagement. Pt sitting EOB upon arrival demined pain and adgreeable to tx session. She declined need for bathing routine this morning, opting to change clothes only. She dressed seated on EOB, returning to supine to bridge in roder to pull pants up. Discussed bathing/dressing routine for home, plans to bathe with set-up seated EOB, sister avilaable to assist PRN. UB dressing completed indepdnently.  Pt's personal drop arm BSC and w/c delivered during session, set-up w/c to pt's size and education provided regarding w/c parts management.  Completed EOB<> drop arm BSC transfer with supervision and assist to set-up equipment. She self propelled w/c througout unit mod I. In therapy gym, pt provided with reacher and educated regarding use, purpose, and safety with reaching tasks via reacher.  In ADL apartment, set-up w/c in prep for transfer to couch with mod cuing required to remove all w/c parts prior to transfer. Supervision squat pivot to get onto couch requiring mod A for squat pivot to return to w/c due to low surface height of couch. Practiced kitchen accessibility and w/c management in functional context. Practiced retrieving items from w/c level using reacher from overhead cabinet. Education provided regarding safety and positioning of w/c in kitchen and reaching for items. Pt returned to  room at end of session, left seated in w/c with all needs in reach.   Therapy Documentation Precautions:  Precautions Precautions: Fall Precaution Comments: right BKA, wound vac Required Braces or Orthoses: Other Brace Other Brace: limb  guard Restrictions Weight Bearing Restrictions: Yes RLE Weight Bearing: Non weight bearing Other Treatments:     Therapy/Group: Individual Therapy  Pleasant Britz L 04/28/2018, 6:48 AM

## 2018-04-28 NOTE — Progress Notes (Signed)
Occupational Therapy Session Note  Patient Details  Name: Judith [name] MRN: [id number] Date of Birth: July 24, 1950  Today's Date: 04/28/2018 OT Individual Time: 1300-1400 OT Individual Time Calculation (min): 60 min    Short Term Goals: Week 2:  OT Short Term Goal 1 (Week 2): STG=LTG due to LOS  Skilled Therapeutic Interventions/Progress Updates:    Treatment session with focus on residual limb care, coping, and BUE strengthening.  Pt received upright in personal w/c reporting just visiting with individual from amputee peer support group and feeling encouraged about her prognosis.  Discussed attending support group and provided with flyer.  Educated on typical recovery and coping process as far as physical and emotional recovery, even flagged pages in Next Steps publication for resources.  Pt propelled w/c to therapy gym and completed stand pivot transfers with CGA.  Engaged in 2 sets of 10 tricep pushups from therapy mat; chest presses, overhead presses, and PNF pattern reaching with 2# medicine ball for BUE strengthening as needed for mobility and self-care tasks.  Pt returned to w/c and propelled back to room.  Pt left upright in w/c with seat belt alarm on and all needs in reach.  Therapy Documentation Precautions:  Precautions Precautions: Fall Precaution Comments: right BKA, wound vac Required Braces or Orthoses: Other Brace Other Brace: limb guard Restrictions Weight Bearing Restrictions: Yes RLE Weight Bearing: Non weight bearing General:   Vital Signs: Therapy Vitals Temp: 98.5 F (36.9 C) Temp Source: Oral Pulse Rate: (!) 52 Resp: 16 BP: (!) 132/59 Patient Position (if appropriate): Lying Oxygen Therapy SpO2: 100 % O2 Device: Room Air Pain:  Pt with no c/o pain   Therapy/Group: Individual Therapy  Simonne Come 04/28/2018, 4:02 PM

## 2018-04-29 ENCOUNTER — Inpatient Hospital Stay (HOSPITAL_COMMUNITY): Payer: Medicare PPO | Admitting: Physical Therapy

## 2018-04-29 LAB — CBC WITH DIFFERENTIAL/PLATELET
Abs Immature Granulocytes: 0.02 10*3/uL (ref 0.00–0.07)
Basophils Absolute: 0.1 10*3/uL (ref 0.0–0.1)
Basophils Relative: 2 %
Eosinophils Absolute: 0.3 10*3/uL (ref 0.0–0.5)
Eosinophils Relative: 6 %
HCT: 36.3 % (ref 36.0–46.0)
Hemoglobin: 11.1 g/dL — ABNORMAL LOW (ref 12.0–15.0)
Immature Granulocytes: 0 %
Lymphocytes Relative: 20 %
Lymphs Abs: 1 10*3/uL (ref 0.7–4.0)
MCH: 27.2 pg (ref 26.0–34.0)
MCHC: 30.6 g/dL (ref 30.0–36.0)
MCV: 89 fL (ref 80.0–100.0)
Monocytes Absolute: 0.6 10*3/uL (ref 0.1–1.0)
Monocytes Relative: 11 %
Neutro Abs: 3.1 10*3/uL (ref 1.7–7.7)
Neutrophils Relative %: 61 %
Platelets: 466 10*3/uL — ABNORMAL HIGH (ref 150–400)
RBC: 4.08 MIL/uL (ref 3.87–5.11)
RDW: 14.9 % (ref 11.5–15.5)
WBC: 5.2 10*3/uL (ref 4.0–10.5)
nRBC: 0 % (ref 0.0–0.2)

## 2018-04-29 LAB — BASIC METABOLIC PANEL
Anion gap: 11 (ref 5–15)
BUN: 26 mg/dL — ABNORMAL HIGH (ref 8–23)
CO2: 24 mmol/L (ref 22–32)
CREATININE: 1.14 mg/dL — AB (ref 0.44–1.00)
Calcium: 8.7 mg/dL — ABNORMAL LOW (ref 8.9–10.3)
Chloride: 98 mmol/L (ref 98–111)
GFR calc non Af Amer: 50 mL/min — ABNORMAL LOW (ref >60)
GFR, EST AFRICAN AMERICAN: 58 mL/min — AB (ref >60)
Glucose, Bld: 364 mg/dL — ABNORMAL HIGH (ref 70–99)
Potassium: 3.7 mmol/L (ref 3.5–5.1)
Sodium: 133 mmol/L — ABNORMAL LOW (ref 135–145)

## 2018-04-29 LAB — GLUCOSE, CAPILLARY
GLUCOSE-CAPILLARY: 250 mg/dL — AB (ref 70–99)
Glucose-Capillary: 243 mg/dL — ABNORMAL HIGH (ref 70–99)
Glucose-Capillary: 224 mg/dL — ABNORMAL HIGH (ref 70–99)
Glucose-Capillary: 154 mg/dL — ABNORMAL HIGH (ref 70–99)

## 2018-04-29 NOTE — Progress Notes (Signed)
Judith [name] is a 67 y.o. female 02/09/1951 [id number]  Subjective: No new complaints. No new problems. Slept well. Feeling OK.  The patient appears very happy.  She did have to take her pain pills yesterday.  Objective: Vital signs in last 24 hours: Temp:  [97.5 F (36.4 C)-98.7 F (37.1 C)] 97.5 F (36.4 C) (12/21 0542) Pulse Rate:  [52-61] 55 (12/21 0542) Resp:  [16] 16 (12/21 0542) BP: (132-145)/(59-89) 145/69 (12/21 0542) SpO2:  [99 %-100 %] 99 % (12/21 0542) Weight change:  Last BM Date: 04/28/18  Intake/Output from previous day: 12/20 0701 - 12/21 0700 In: 684 [P.O.:684] Out: -  Last cbgs: CBG (last 3)  Recent Labs    04/28/18 1631 04/28/18 2109 04/29/18 0655  GLUCAP 92 249* 243*     Physical Exam General: No apparent distress   HEENT: not dry Lungs: Normal effort. Lungs clear to auscultation, no crackles or wheezes. Cardiovascular: Regular rate and rhythm, no edema Abdomen: S/NT/ND; BS(+) Musculoskeletal:  unchanged Neurological: No new neurological deficits Wounds: Clean Skin: clear  Aging changes Mental state: Alert, oriented, cooperative    Lab Results: BMET    Component Value Date/Time   NA 133 (L) 04/29/2018 0518   NA 137 09/19/2017 1649   K 3.7 04/29/2018 0518   CL 98 04/29/2018 0518   CO2 24 04/29/2018 0518   GLUCOSE 364 (H) 04/29/2018 0518   BUN 26 (H) 04/29/2018 0518   BUN 17 09/19/2017 1649   CREATININE 1.14 (H) 04/29/2018 0518   CREATININE 0.82 02/05/2016 1047   CALCIUM 8.7 (L) 04/29/2018 0518   GFRNONAA 50 (L) 04/29/2018 0518   GFRNONAA >89 04/13/2015 0905   GFRAA 58 (L) 04/29/2018 0518   GFRAA >89 04/13/2015 0905   CBC    Component Value Date/Time   WBC 5.2 04/29/2018 0518   RBC 4.08 04/29/2018 0518   HGB 11.1 (L) 04/29/2018 0518   HGB 12.2 07/22/2017 0857   HCT 36.3 04/29/2018 0518   HCT 36.3 07/22/2017 0857   PLT 466 (H) 04/29/2018 0518   PLT 374 07/22/2017 0857   MCV 89.0 04/29/2018 0518   MCV 84.5 09/19/2017  1614   MCV 87 07/22/2017 0857   MCH 27.2 04/29/2018 0518   MCHC 30.6 04/29/2018 0518   RDW 14.9 04/29/2018 0518   RDW 14.3 07/22/2017 0857   LYMPHSABS 1.0 04/29/2018 0518   LYMPHSABS 1.8 07/22/2017 0857   MONOABS 0.6 04/29/2018 0518   EOSABS 0.3 04/29/2018 0518   EOSABS 0.3 07/22/2017 0857   BASOSABS 0.1 04/29/2018 0518   BASOSABS 0.1 07/22/2017 0857    Studies/Results: No results found.  Medications: I have reviewed the patient's current medications.  Assessment/Plan:  1.  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  CIR 2.  DVT prophylaxis with Eliquis 3.  Pain management with gabapentin, Norco, Robaxin as needed.  The patient is happy with her pain control 4.  Depression.  Continue with fluoxetine, Wellbutrin 5.  Diabetes.  On Lantus 6.  Atrial fibrillation.  On Eliquis.  Rate controlled 7.  Acute blood loss anemia.  Monitoring CBC 8.  Chronic kidney disease stage III.  Monitoring labs 9.  Coronary artery disease.  No angina 10.  Hyperthyroidism.  Continue Tapazole.  Follow-up with Dr. Loanne Drilling 11.  Dyslipidemia.  On Lipitor and fenofibrate 12.  Hypertension.  On amlodipine and beta-blockers (Toprol).        Length of stay, days: 9  Walker Kehr , MD 04/29/2018, 11:13 AM

## 2018-04-30 ENCOUNTER — Inpatient Hospital Stay (HOSPITAL_COMMUNITY): Payer: Medicare PPO | Admitting: Occupational Therapy

## 2018-04-30 LAB — GLUCOSE, CAPILLARY
GLUCOSE-CAPILLARY: 363 mg/dL — AB (ref 70–99)
Glucose-Capillary: 181 mg/dL — ABNORMAL HIGH (ref 70–99)
Glucose-Capillary: 189 mg/dL — ABNORMAL HIGH (ref 70–99)
Glucose-Capillary: 364 mg/dL — ABNORMAL HIGH (ref 70–99)

## 2018-04-30 MED ORDER — INSULIN GLARGINE 100 UNIT/ML ~~LOC~~ SOLN
10.0000 [IU] | Freq: Two times a day (BID) | SUBCUTANEOUS | Status: DC
  Administered 2018-04-30 – 2018-05-02 (×4): 10 [IU] via SUBCUTANEOUS
  Filled 2018-04-30 (×4): qty 0.1

## 2018-04-30 NOTE — Progress Notes (Signed)
Occupational Therapy Session Note  Patient Details  Name: Judith [name] MRN: [id number] Date of Birth: 08-15-1950  Today's Date: 04/30/2018 OT Individual Time: 1310-1354 OT Individual Time Calculation (min): 44 min    Short Term Goals: Week 1:  OT Short Term Goal 1 (Week 1): Pt will complete SPT to [facility] with CGA OT Short Term Goal 1 - Progress (Week 1): Met OT Short Term Goal 2 (Week 1): Pt will don pants with CGA OT Short Term Goal 2 - Progress (Week 1): Met OT Short Term Goal 3 (Week 1): Pt will utilize proper positioning strategies for R residual limb with no more than 1 vc OT Short Term Goal 3 - Progress (Week 1): Met  Skilled Therapeutic Interventions/Progress Updates:    1;1. Pt completes w/c mobility to gift shop with supervision and VC for backing w/c up onto  Elevator. Pt completes close quarters w/c mobility through gift shop looking at items, reaching and using LE to help propel around displays with VC for locking brakes prior to reach forward. Pt completes transfer training with VC for squat pivot instead of standing all the way upright without RW. Pt completes 3x30 passes of basketball with 1# wrist weights (chest, bounce and overhead pass) with VC for overhead pass technique for BUE/tricep strengthening required for BADLs. Pt propels w/c back to room with belt alarm on and call light in reach  Therapy Documentation Precautions:  Precautions Precautions: Fall Precaution Comments: right BKA, wound vac Required Braces or Orthoses: Other Brace Other Brace: limb guard Restrictions Weight Bearing Restrictions: Yes RLE Weight Bearing: Non weight bearing General:   Vital Signs: Therapy Vitals Temp: 98.1 F (36.7 C) Pulse Rate: (!) 58 Resp: 16 BP: 124/60 Patient Position (if appropriate): Lying Oxygen Therapy SpO2: 99 % O2 Device: Room Air Pain:   ADL: ADL Eating: Modified independent Where Assessed-Eating: Edge of bed Grooming: Supervision/safety Where  Assessed-Grooming: Sitting at sink Upper Body Bathing: Supervision/safety Where Assessed-Upper Body Bathing: Edge of bed Lower Body Bathing: Minimal assistance Where Assessed-Lower Body Bathing: Edge of bed Upper Body Dressing: Supervision/safety Where Assessed-Upper Body Dressing: Edge of bed Lower Body Dressing: Minimal assistance Where Assessed-Lower Body Dressing: Edge of bed Toileting: Minimal assistance Where Assessed-Toileting: Bedside Commode Toilet Transfer: Moderate assistance Toilet Transfer Method: Stand pivot Toilet Transfer Equipment: Art gallery manager    Praxis   Exercises:   Other Treatments:     Therapy/Group: Individual Therapy  Tonny Branch 04/30/2018, 1:55 PM

## 2018-04-30 NOTE — Progress Notes (Signed)
Judith [name] is a 67 y.o. female 03-24-1951 [id number]  Subjective: No new complaints. No new problems. Slept well. Feeling OK.  The patient is trying to avoid taking pain medicines  Objective: Vital signs in last 24 hours: Temp:  [97.6 F (36.4 C)-99.1 F (37.3 C)] 98.1 F (36.7 C) (12/22 0419) Pulse Rate:  [55-62] 59 (12/22 0419) Resp:  [16-18] 18 (12/22 0419) BP: (121-146)/(60-69) 141/60 (12/22 0419) SpO2:  [96 %-99 %] 99 % (12/22 0419) Weight:  [51.3 kg-54.4 kg] 51.3 kg (12/22 0419) Weight change:  Last BM Date: 04/29/18  Intake/Output from previous day: 12/21 0701 - 12/22 0700 In: 110 [P.O.:462] Out: -  Last cbgs: CBG (last 3)  Recent Labs    04/29/18 1627 04/29/18 2130 04/30/18 0615  GLUCAP 154* 250* 181*     Physical Exam General: No apparent distress   HEENT: not dry Lungs: Normal effort. Lungs clear to auscultation, no crackles or wheezes. Cardiovascular: Irregular rate and rhythm, no edema Abdomen: S/NT/ND; BS(+) Musculoskeletal:  unchanged Neurological: No new neurological deficits Wounds: Clean Skin: clear  Aging changes Mental state: Alert, oriented, cooperative    Lab Results: BMET    Component Value Date/Time   NA 133 (L) 04/29/2018 0518   NA 137 09/19/2017 1649   K 3.7 04/29/2018 0518   CL 98 04/29/2018 0518   CO2 24 04/29/2018 0518   GLUCOSE 364 (H) 04/29/2018 0518   BUN 26 (H) 04/29/2018 0518   BUN 17 09/19/2017 1649   CREATININE 1.14 (H) 04/29/2018 0518   CREATININE 0.82 02/05/2016 1047   CALCIUM 8.7 (L) 04/29/2018 0518   GFRNONAA 50 (L) 04/29/2018 0518   GFRNONAA >89 04/13/2015 0905   GFRAA 58 (L) 04/29/2018 0518   GFRAA >89 04/13/2015 0905   CBC    Component Value Date/Time   WBC 5.2 04/29/2018 0518   RBC 4.08 04/29/2018 0518   HGB 11.1 (L) 04/29/2018 0518   HGB 12.2 07/22/2017 0857   HCT 36.3 04/29/2018 0518   HCT 36.3 07/22/2017 0857   PLT 466 (H) 04/29/2018 0518   PLT 374 07/22/2017 0857   MCV 89.0 04/29/2018  0518   MCV 84.5 09/19/2017 1614   MCV 87 07/22/2017 0857   MCH 27.2 04/29/2018 0518   MCHC 30.6 04/29/2018 0518   RDW 14.9 04/29/2018 0518   RDW 14.3 07/22/2017 0857   LYMPHSABS 1.0 04/29/2018 0518   LYMPHSABS 1.8 07/22/2017 0857   MONOABS 0.6 04/29/2018 0518   EOSABS 0.3 04/29/2018 0518   EOSABS 0.3 07/22/2017 0857   BASOSABS 0.1 04/29/2018 0518   BASOSABS 0.1 07/22/2017 0857    Studies/Results: No results found.  Medications: I have reviewed the patient's current medications.  Assessment/Plan:   1.  Status post right transtibial amputation on 04/15/2018.  Continue with CIR 2.  DVT prophylaxis with Eliquis 3.  Pain management with gabapentin, Norco (the patient is trying not to take it), Robaxin as needed. 4.  Depression.  Continue with fluoxetine and Wellbutrin 5.  Diabetes.  On Lantus 6.  Atrial fibrillation.  She is on Eliquis.  The rate is controlled 7.  Acute blood loss anemia.  We have been monitoring her CBC 8.  Chronic kidney disease stage III.  Continue to watch kidney function 9.  Coronary artery disease.  No angina 10.  Hyperthyroidism.  Continue with Tapazole.  Outpatient follow-up with Dr. Loanne Drilling 11.  Dyslipidemia.  On Lipitor and fenofibrate 12.  Hypertension.  On amlodipine and Toprol    Length of stay,  days: 10  Walker Kehr , MD 04/30/2018, 12:15 PM

## 2018-04-30 NOTE — Plan of Care (Signed)
  Problem: Consults Goal: RH LIMB LOSS PATIENT EDUCATION Description Description: See Patient Education module for eduction specifics. Outcome: Progressing Goal: Skin Care Protocol Initiated - if Braden Score 18 or less Description If consults are not indicated, leave blank or document N/A Outcome: Progressing   Problem: RH BOWEL ELIMINATION Goal: RH STG MANAGE BOWEL WITH ASSISTANCE Description STG Manage Bowel with [place].  Outcome: Progressing Goal: RH STG MANAGE BOWEL W/MEDICATION W/ASSISTANCE Description STG Manage Bowel with Medication with Assistance. Min  Outcome: Progressing   Problem: RH BLADDER ELIMINATION Goal: RH STG MANAGE BLADDER WITH ASSISTANCE Description STG Manage Bladder With  Min Assistance  Outcome: Progressing   Problem: RH SKIN INTEGRITY Goal: RH STG SKIN FREE OF INFECTION/BREAKDOWN Description Skin free of breakdown and infection with min assist  Outcome: Progressing Goal: RH STG MAINTAIN SKIN INTEGRITY WITH ASSISTANCE Description STG Maintain Skin Integrity With [place].  Outcome: Progressing Goal: RH STG ABLE TO PERFORM INCISION/WOUND CARE W/ASSISTANCE Description STG Able To Perform Incision/Wound Care With Mod Assistance.  Outcome: Progressing   Problem: RH SAFETY Goal: RH STG ADHERE TO SAFETY PRECAUTIONS W/ASSISTANCE/DEVICE Description STG Adhere to Safety Precautions With Min  Assistance/Device.  Outcome: Progressing Goal: RH STG DECREASED RISK OF FALL WITH ASSISTANCE Description STG Decreased Risk of Fall With Assistance. min  Outcome: Progressing   Problem: RH PAIN MANAGEMENT Goal: RH STG PAIN MANAGED AT OR BELOW PT'S PAIN GOAL Description <= 3/10  Outcome: Progressing   Problem: RH KNOWLEDGE DEFICIT LIMB LOSS Goal: RH STG INCREASE KNOWLEDGE OF SELF CARE AFTER LIMB LOSS Description Patient will be able to demonstrate care of residual limb with cues/supervision. Will be able to describe pain management  strategies with cues/handouts  Outcome: Progressing

## 2018-05-01 ENCOUNTER — Inpatient Hospital Stay (HOSPITAL_COMMUNITY): Payer: Medicare PPO | Admitting: Occupational Therapy

## 2018-05-01 ENCOUNTER — Inpatient Hospital Stay (HOSPITAL_COMMUNITY): Payer: Medicare PPO | Admitting: Physical Therapy

## 2018-05-01 LAB — CBC
HCT: 35.3 % — ABNORMAL LOW (ref 36.0–46.0)
Hemoglobin: 10.8 g/dL — ABNORMAL LOW (ref 12.0–15.0)
MCH: 27.3 pg (ref 26.0–34.0)
MCHC: 30.6 g/dL (ref 30.0–36.0)
MCV: 89.1 fL (ref 80.0–100.0)
Platelets: 519 10*3/uL — ABNORMAL HIGH (ref 150–400)
RBC: 3.96 MIL/uL (ref 3.87–5.11)
RDW: 14.6 % (ref 11.5–15.5)
WBC: 8.7 10*3/uL (ref 4.0–10.5)
nRBC: 0 % (ref 0.0–0.2)

## 2018-05-01 LAB — GLUCOSE, CAPILLARY
Glucose-Capillary: 158 mg/dL — ABNORMAL HIGH (ref 70–99)
Glucose-Capillary: 197 mg/dL — ABNORMAL HIGH (ref 70–99)
Glucose-Capillary: 265 mg/dL — ABNORMAL HIGH (ref 70–99)
Glucose-Capillary: 242 mg/dL — ABNORMAL HIGH (ref 70–99)

## 2018-05-01 MED ORDER — METOPROLOL SUCCINATE ER 25 MG PO TB24: 25.0000 mg | ORAL_TABLET | Freq: Two times a day (BID) | ORAL | 1 refills | Status: DC

## 2018-05-01 MED ORDER — ACETAMINOPHEN 325 MG PO TABS: 325.0000 mg | ORAL_TABLET | Freq: Four times a day (QID) | ORAL | Status: DC | PRN

## 2018-05-01 MED ORDER — SOTALOL HCL 120 MG PO TABS: 120.0000 mg | ORAL_TABLET | Freq: Two times a day (BID) | ORAL | 3 refills | Status: DC

## 2018-05-01 MED ORDER — FENOFIBRATE 160 MG PO TABS: 160.0000 mg | ORAL_TABLET | Freq: Every day | ORAL | 1 refills | Status: DC

## 2018-05-01 MED ORDER — FLUOXETINE HCL 20 MG PO CAPS: 20.0000 mg | ORAL_CAPSULE | Freq: Every day | ORAL | 2 refills | Status: DC

## 2018-05-01 MED ORDER — ALBUTEROL SULFATE HFA 108 (90 BASE) MCG/ACT IN AERS: 1.0000 | INHALATION_SPRAY | Freq: Four times a day (QID) | RESPIRATORY_TRACT | 0 refills | Status: AC | PRN

## 2018-05-01 MED ORDER — ATORVASTATIN CALCIUM 20 MG PO TABS: 20.0000 mg | ORAL_TABLET | Freq: Every day | ORAL | 3 refills | Status: AC

## 2018-05-01 MED ORDER — POTASSIUM CHLORIDE CRYS ER 20 MEQ PO TBCR: 20.0000 meq | EXTENDED_RELEASE_TABLET | Freq: Every day | ORAL | 1 refills | Status: DC

## 2018-05-01 MED ORDER — HYDROCODONE-ACETAMINOPHEN 5-325 MG PO TABS: 1.0000 | ORAL_TABLET | ORAL | 0 refills | Status: DC | PRN

## 2018-05-01 MED ORDER — METHOCARBAMOL 500 MG PO TABS: 500.0000 mg | ORAL_TABLET | Freq: Four times a day (QID) | ORAL | 0 refills | Status: DC | PRN

## 2018-05-01 MED ORDER — METHIMAZOLE 10 MG PO TABS: 10.0000 mg | ORAL_TABLET | Freq: Every day | ORAL | 11 refills | Status: DC

## 2018-05-01 MED ORDER — ONDANSETRON HCL 4 MG/5ML PO SOLN
4.0000 mg | Freq: Three times a day (TID) | ORAL | Status: DC | PRN
  Filled 2018-05-01: qty 5

## 2018-05-01 MED ORDER — APIXABAN 5 MG PO TABS: 5.0000 mg | ORAL_TABLET | Freq: Two times a day (BID) | ORAL | 3 refills | Status: DC

## 2018-05-01 MED ORDER — GABAPENTIN 300 MG PO CAPS: 900.0000 mg | ORAL_CAPSULE | Freq: Two times a day (BID) | ORAL | 0 refills | Status: DC

## 2018-05-01 MED ORDER — GABAPENTIN 300 MG PO CAPS
900.0000 mg | ORAL_CAPSULE | Freq: Two times a day (BID) | ORAL | Status: DC
  Administered 2018-05-01 – 2018-05-02 (×3): 900 mg via ORAL
  Filled 2018-05-01 (×3): qty 3
  Filled 2018-05-01: qty 9

## 2018-05-01 MED ORDER — ONDANSETRON HCL 4 MG PO TABS
4.0000 mg | ORAL_TABLET | Freq: Three times a day (TID) | ORAL | Status: DC | PRN
  Administered 2018-05-01 – 2018-05-02 (×3): 4 mg via ORAL
  Filled 2018-05-01 (×3): qty 1

## 2018-05-01 MED ORDER — FUROSEMIDE 20 MG PO TABS: 60.0000 mg | ORAL_TABLET | Freq: Every day | ORAL | 3 refills | Status: DC

## 2018-05-01 MED ORDER — AMLODIPINE BESYLATE 2.5 MG PO TABS: 7.5000 mg | ORAL_TABLET | Freq: Every day | ORAL | 1 refills | Status: DC

## 2018-05-01 MED ORDER — BUPROPION HCL ER (SR) 150 MG PO TB12: 150.0000 mg | ORAL_TABLET | Freq: Two times a day (BID) | ORAL | 1 refills | Status: DC

## 2018-05-01 MED ORDER — PANTOPRAZOLE SODIUM 40 MG PO TBEC: 40.0000 mg | DELAYED_RELEASE_TABLET | Freq: Two times a day (BID) | ORAL | 1 refills | Status: DC

## 2018-05-01 NOTE — Progress Notes (Addendum)
[place] PHYSICAL MEDICINE & REHABILITATION PROGRESS NOTE   Subjective/Complaints: Patient seen sitting up in her chair this morning.  She states she slept well overnight, but had a fall overnight falling on her stomach.  She is working with therapy this morning.  She was noted to have a fall during squat pivot transfer.  Noted to have significant bleeding overnight.  ROS: Denies CP, SOB, N/V/D  Objective:   No results found. Recent Labs    04/29/18 0518  WBC 5.2  HGB 11.1*  HCT 36.3  PLT 466*   Recent Labs    04/29/18 0518  NA 133*  K 3.7  CL 98  CO2 24  GLUCOSE 364*  BUN 26*  CREATININE 1.14*  CALCIUM 8.7*    Intake/Output Summary (Last 24 hours) at 05/01/2018 0912 Last data filed at 05/01/2018 0800 Gross per 24 hour  Intake 240 ml  Output -  Net 240 ml     Physical Exam: Vital Signs Blood pressure (!) 157/60, pulse (!) 57, temperature 98.8 F (37.1 C), temperature source Oral, resp. rate 18, height 5\' 4"  (1.626 m), weight 55.3 kg, SpO2 100 %.  Constitutional: No distress . Vital signs reviewed. HENT: Normocephalic.  Atraumatic. Eyes: EOMI. No discharge. Cardiovascular: RRR.  No JVD. Respiratory: CTA bilaterally.  Normal effort. GI: BS +. Non-distended. Musc: Right BKA with edema and tenderness Neurologic: Alert. Motor: 5/5 in bilateral deltoid, bicep, tricep, grip,Left  hip flexor, knee extensors, ankle dorsiflexor and plantar flexor, unchanged Right hip flexion: 4-/5 (pain inhibition) Skin: BKA with erythema along staple line and small areas of sanguinous drainage.  Assessment/Plan: 1. Functional deficits secondary to Right transtibial amputation  which require 3+ hours per day of interdisciplinary therapy in a comprehensive inpatient rehab setting.  Physiatrist is providing close team supervision and 24 hour management of active medical problems listed below.  Physiatrist and rehab team continue to assess barriers to discharge/monitor patient  progress toward functional and medical goals  Care Tool:  Bathing    Body parts bathed by patient: Right arm, Left arm, Abdomen, Front perineal area, Buttocks, Face, Left lower leg, Chest, Right upper leg, Left upper leg     Body parts n/a: Right lower leg   Bathing assist Assist Level: Supervision/Verbal cueing     Upper Body Dressing/Undressing Upper body dressing   What is the patient wearing?: Pull over shirt, Bra    Upper body assist Assist Level: Independent    Lower Body Dressing/Undressing Lower body dressing      What is the patient wearing?: Pants     Lower body assist Assist for lower body dressing: Supervision/Verbal cueing     Toileting Toileting    Toileting assist Assist for toileting: Contact Guard/Touching assist     Transfers Chair/bed transfer  Transfers assist     Chair/bed transfer assist level: Contact Guard/Touching assist Chair/bed transfer assistive device: Armrests   Locomotion Ambulation   Ambulation assist   Ambulation activity did not occur: Safety/medical concerns(fatigue )  Assist level: Minimal Assistance - Patient > 75% Assistive device: Walker-rolling Max distance: 5'   Walk 10 feet activity   Assist  Walk 10 feet activity did not occur: Safety/medical concerns        Walk 50 feet activity   Assist Walk 50 feet with 2 turns activity did not occur: Safety/medical concerns         Walk 150 feet activity   Assist Walk 150 feet activity did not occur: Safety/medical concerns  Walk 10 feet on uneven surface  activity   Assist Walk 10 feet on uneven surfaces activity did not occur: Safety/medical concerns         Wheelchair     Assist Will patient use wheelchair at discharge?: Yes Type of Wheelchair: Manual    Wheelchair assist level: Supervision/Verbal cueing Max wheelchair distance: 150    Wheelchair 50 feet with 2 turns activity    Assist        Assist Level:  Supervision/Verbal cueing   Wheelchair 150 feet activity     Assist Wheelchair 150 feet activity did not occur: Safety/medical concerns(fatigue )   Assist Level: Supervision/Verbal cueing    Medical Problem List and Plan: 1.Decreased functional mobilitysecondary to right transtibial amputation 04/15/2018.  Continue CIR  Weekend notes reviewed  Plan for d/c tomorrow  Will see patient for transitional care management in 1-2 weeks post-discharge 2. DVT Prophylaxis/Anticoagulation: Eliquis 3. Pain Management:Neurontin 900 mg twice a day, hydrocodone and Robaxin as needed 4. Mood:Prozac 20 mg daily at bedtime, Wellbutrin 150 mg twice a day. Provide emotional support 5. Neuropsych: This patientiscapable of making decisions on herown behalf. 6. Skin/Wound Care:Routine skin checks  Erythema along staple lines, continue to monitor 7. Fluids/Electrolytes/Nutrition:Routine in and out's  8. Acute blood loss anemia.   Hemoglobin 11.1 on 12/21  Labs ordered  Continue to monitor 9. Ulcerated esophagitis with superficial ulcer. Endoscopy complete 04/16/2018. Continue PPI. Follow-up GI as needed 10. Atrial fibrillation. Cardiac rate controlled. Continue Eliquis/Betapace 120 mg every 12, Toprol 25 mg twice a day Vitals:   05/01/18 0423 05/01/18 0645  BP: 137/66 (!) 157/60  Pulse: (!) 51 (!) 57  Resp: 18 18  Temp: 98.9 F (37.2 C) 98.8 F (37.1 C)  SpO2: 100% 100%  11. Diabetes mellitus with peripheral neuropathy. Hemoglobin A1c 7.9. CBG (last 3)  Recent Labs    04/30/18 1653 04/30/18 2119 05/01/18 0605  GLUCAP 363* 364* 158*   Lantus15units QHS, changed to 8 units twice daily on 12/20, increased to 10 twice daily on 12/22.   ?  Allergy to NovoLog  Labile and elevated on 12/23  Will change to home medication discharge 12. Diastolic congestive heart failure. Continue Lasix as directed . Monitor for any signs of fluid overload.  Daily weights ordered Filed Weights    04/29/18 1515 04/30/18 0419 05/01/18 0423  Weight: 54.4 kg 51.3 kg 55.3 kg    13. CKD III.  Monitor closely while on diuretic  Creatinine 1.14 on 12/21  Encourage fluids  Continue to monitor 14.CAD with stenting. No chest pain. 15. Hypothyroidism. Tapazole 10 mg daily- f/u Endo Dr Loanne Drilling 16. Hyperlipidemia. Lipitor/fenofibrate. 17.  Hypertension  Norvasc increased to 7.5 on 12/16  Labile on 12/23  Continue to monitor 18.  Constipation  Bowel regimen increased on 12/20    LOS: 11 days A FACE TO FACE EVALUATION WAS PERFORMED  Ankit Lorie Phenix 05/01/2018, 9:12 AM

## 2018-05-01 NOTE — Progress Notes (Signed)
Placed patient on home CPAP for the night  

## 2018-05-01 NOTE — Progress Notes (Signed)
Social Work  Discharge Note  The overall goal for the admission was met for:   Discharge location: Yes-HOME WITH SISTER WHO CAN BE THERE SHORT TIME THEN WILL BE ALONE  Length of Stay: Yes-12 DAYS  Discharge activity level: Yes-SUPERVISION WITH CUEING-CAN DIRECT HER CARE  Home/community participation: Yes  Services provided included: MD, RD, PT, OT, RN, CM, Pharmacy, Neuropsych and SW  Financial Services: Medicaid and Private Insurance: [facility] MEDICARE-PT, OT, RN  Follow-up services arranged: Home Health: [facility], DME: [place] and Patient/Family request agency HH: PREF HAD BEFORE, DME: NO PREF  Comments (or additional information):SISTER IN FOR EDUCATION AND CAN BE WITH FOR SHORT TIME UNTIL GOES BACK TO WORK.   Patient/Family verbalized understanding of follow-up arrangements: Yes  Individual responsible for coordination of the follow-up plan: SELF & [name]-SISTER  Confirmed correct DME delivered: Elease Hashimoto 05/01/2018    Elease Hashimoto

## 2018-05-01 NOTE — Discharge Summary (Signed)
Discharge summary job [id number]

## 2018-05-01 NOTE — Progress Notes (Signed)
Physical Therapy Session Note  Patient Details  Name: Judith [name] MRN: [id number] Date of Birth: [birth year]  Today's Date: 05/01/2018 PT Individual Time: 6945-0388 PT Individual Time Calculation (min): 60 min   Short Term Goals: Week 1:  PT Short Term Goal 1 (Week 1): Patient to be able to perform bed mobility with Mod(I)  PT Short Term Goal 2 (Week 1): Patient to perform bed to chair transfers with min guard and RW  PT Short Term Goal 3 (Week 1): Patient to initiate gait training with RW   Skilled Therapeutic Interventions/Progress Updates: Pt received seated in w/c, c/o pain as below and agreeable to treatment. Performed transfer w/c <>mat table with S squat pivot. Applied belt loop suspension to R limb guard; demo's improved maintenance of limb guard during mobility throughout session. Sit <>stand from mat table min guard with RW; reviewed recommendation that pt not transfer or ambulate with RW at home at this time, continue progressing with [facility] PT. Performed car transfer and toilet transfer with min guard; min cues for w/c setup and technique. Remained in w/c at end of session, lap belt alarm intact and all needs in reach.      Therapy Documentation Precautions:  Precautions Precautions: Fall Precaution Comments: right BKA, wound vac Required Braces or Orthoses: Other Brace Other Brace: limb guard Restrictions Weight Bearing Restrictions: Yes RLE Weight Bearing: Non weight bearing Pain: Pain Assessment Pain Scale: 0-10 Pain Score: 5  Pain Type: Acute pain Pain Location: Leg Pain Orientation: Right Pain Radiating Towards: hip Pain Descriptors / Indicators: Aching;Discomfort;Dull Pain Frequency: Intermittent Pain Onset: On-going Patients Stated Pain Goal: 4 Pain Intervention(s): Medication (See eMAR);Repositioned    Therapy/Group: Individual Therapy  Corliss Skains 05/01/2018, 12:08 PM

## 2018-05-01 NOTE — Plan of Care (Signed)
  Problem: Consults Goal: RH LIMB LOSS PATIENT EDUCATION Description Description: See Patient Education module for eduction specifics. Outcome: Progressing Goal: Skin Care Protocol Initiated - if Braden Score 18 or less Description If consults are not indicated, leave blank or document N/A Outcome: Progressing   Problem: RH BOWEL ELIMINATION Goal: RH STG MANAGE BOWEL WITH ASSISTANCE Description STG Manage Bowel with [place].  Outcome: Progressing Goal: RH STG MANAGE BOWEL W/MEDICATION W/ASSISTANCE Description STG Manage Bowel with Medication with Assistance. Min  Outcome: Progressing   Problem: RH BLADDER ELIMINATION Goal: RH STG MANAGE BLADDER WITH ASSISTANCE Description STG Manage Bladder With  Min Assistance  Outcome: Progressing   Problem: RH SKIN INTEGRITY Goal: RH STG SKIN FREE OF INFECTION/BREAKDOWN Description Skin free of breakdown and infection with min assist  Outcome: Progressing Goal: RH STG MAINTAIN SKIN INTEGRITY WITH ASSISTANCE Description STG Maintain Skin Integrity With [place].  Outcome: Progressing Goal: RH STG ABLE TO PERFORM INCISION/WOUND CARE W/ASSISTANCE Description STG Able To Perform Incision/Wound Care With Mod Assistance.  Outcome: Progressing   Problem: RH SAFETY Goal: RH STG ADHERE TO SAFETY PRECAUTIONS W/ASSISTANCE/DEVICE Description STG Adhere to Safety Precautions With Min  Assistance/Device.  Outcome: Progressing Goal: RH STG DECREASED RISK OF FALL WITH ASSISTANCE Description STG Decreased Risk of Fall With Assistance. min  Outcome: Progressing   Problem: RH PAIN MANAGEMENT Goal: RH STG PAIN MANAGED AT OR BELOW PT'S PAIN GOAL Description <= 3/10  Outcome: Progressing   Problem: RH KNOWLEDGE DEFICIT LIMB LOSS Goal: RH STG INCREASE KNOWLEDGE OF SELF CARE AFTER LIMB LOSS Description Patient will be able to demonstrate care of residual limb with cues/supervision. Will be able to describe pain management  strategies with cues/handouts  Outcome: Progressing

## 2018-05-01 NOTE — Progress Notes (Signed)
Physical Therapy Session Note  Patient Details  Name: Judith [name] MRN: [id number] Date of Birth: [birth year]  Today's Date: 05/01/2018 PT Individual Time: 1445-1525 PT Individual Time Calculation (min): 40 min   Short Term Goals: Week 1:  PT Short Term Goal 1 (Week 1): Patient to be able to perform bed mobility with Mod(I)  PT Short Term Goal 2 (Week 1): Patient to perform bed to chair transfers with min guard and RW  PT Short Term Goal 3 (Week 1): Patient to initiate gait training with RW   Skilled Therapeutic Interventions/Progress Updates: Pt presented in w/c agreeable to therapy. Pt stating increased pain in residual limb and also stating received pain meds but unable to provide numerical assessment. Pt propelled to rehab gym and performed squat pivot transfer to mat with pt providing verbal instructions to PTA on set up and how to perform transfer. Pt participated in x 2 bouts of horseshoes in standing with RW with PTA providing CGA and pt alternating hands reaching for horseshoes. Pt indicating increased pain in residual limb when in standing. Pt transferred to w/c back in same manner as prior. Pt then propelled around unit for BUE strengthening and endurance. Pt propelled back to room and remained in w/c with current needs met.      Therapy Documentation Precautions:  Precautions Precautions: Fall Precaution Comments: right BKA, wound vac Required Braces or Orthoses: Other Brace Other Brace: limb guard Restrictions Weight Bearing Restrictions: Yes RLE Weight Bearing: Non weight bearing General:   Vital Signs: Therapy Vitals Temp: 98.6 F (37 C) Temp Source: Oral Pulse Rate: (!) 58 Resp: 17 BP: (!) 145/72 Patient Position (if appropriate): Sitting Oxygen Therapy SpO2: 100 % O2 Device: Room Air Pain: Pain Assessment Pain Scale: 0-10 Pain Score: 5  Pain Type: Acute pain Pain Location: Leg Pain Orientation: Right Pain Radiating Towards: hip Pain Descriptors /  Indicators: Aching;Burning Pain Frequency: Intermittent Pain Onset: On-going Patients Stated Pain Goal: 5 Pain Intervention(s): Medication (See eMAR);Repositioned    Therapy/Group: Individual Therapy  Reni Hausner  Jacelynn Hayton, PTA  05/01/2018, 4:13 PM

## 2018-05-01 NOTE — Progress Notes (Signed)
05/01/18 0423  What Happened  Who witnessed fall? Silvestre Moment, LPN  Patients activity before fall bathroom-assisted  Point of contact other (comment)  Was patient injured? Yes (BKA stump incision )  Patient found other (Comment) (assisted to floor)  Found by  Janett Billow with patient assisted to floor)  Stated prior activity other (comment) (from bed to [facility])  Follow Up  MD notified Plotnikov  Time MD notified 54  Family notified No- patient refusal (pt stated she did not want family notified until after 10am)  Time family notified  (does not want sister called until 10am)  Additional tests No  Simple treatment Other (comment) (dressing, ice and pain medication (see MAR))  Progress note created (see row info) Yes  Adult Fall Risk Assessment  Risk Factor Category (scoring not indicated) Fall has occurred during this admission (document High fall risk)  Age 67  Fall History: Fall within 6 months prior to admission 0  Elimination; Bowel and/or Urine Incontinence 0  Elimination; Bowel and/or Urine Urgency/Frequency 2  Medications: includes PCA/Opiates, Anti-convulsants, Anti-hypertensives, Diuretics, Hypnotics, Laxatives, Sedatives, and Psychotropics 5  Patient Care Equipment 0  Mobility-Assistance 2  Mobility-Gait 2  Mobility-Sensory Deficit 0  Altered awareness of immediate physical environment 0  Impulsiveness 0  Lack of understanding of one's physical/cognitive limitations 0  Total Score 12  Patient Fall Risk Level High fall risk  Adult Fall Risk Interventions  Required Bundle Interventions *See Row Information* High fall risk - low, moderate, and high requirements implemented  Additional Interventions Lap belt while in chair/wheelchair;PT/OT need assessed if change in mobility from baseline;Room near nurses station;Use of appropriate toileting equipment (bedpan, BSC, etc.)  Screening for Fall Injury Risk (To be completed on HIGH fall risk patients) - Assessing Need for  Low Bed  Risk For Fall Injury- Low Bed Criteria Previous fall this admission  Will Implement Low Bed and Floor Mats Yes  Screening for Fall Injury Risk (To be completed on HIGH fall risk patients who do not meet crieteria for Low Bed) - Assessing Need for Floor Mats Only  Risk For Fall Injury- Criteria for Floor Mats None identified - No additional interventions needed  Will Implement Floor Mats Yes  Vitals  Temp Source Oral  BP 137/66  MAP (mmHg) 85  BP Location Left Arm  BP Method Automatic  Patient Position (if appropriate) Lying  Pulse Rate (!) 51  Pulse Rate Source Dinamap  Oxygen Therapy  SpO2 100 %  O2 Device Room Air  Pain Assessment  Pain Scale 0-10  Pain Score 10  Pain Type Acute pain;Neuropathic pain;Phantom pain  Pain Location Leg  Pain Orientation Right  Pain Descriptors / Indicators Penetrating;Discomfort;Burning;Constant  Pain Frequency Intermittent  Pain Onset Sudden  Patients Stated Pain Goal 3  Pain Intervention(s) Medication (See eMAR);MD notified (Comment);RN made aware  Multiple Pain Sites No  Neurological  Neuro (WDL) WDL  Level of Consciousness Alert  Orientation Level Oriented X4  Cognition Appropriate at baseline  Speech Clear  Musculoskeletal  Musculoskeletal (WDL) X  Assistive Device BSC  Generalized Weakness Yes  Weight Bearing Restrictions Yes  RLE Weight Bearing NWB  Musculoskeletal Details  RLE Full movement  RLE Ortho/Supportive Device Ace wrap;Pressure bandage  LLE Weakness  Integumentary  Integumentary (WDL) X  Skin Color Appropriate for ethnicity  Skin Condition Dry  Skin Integrity Surgical Incision (see LDA)  Abrasion Location Foot  Abrasion Location Orientation Left  Ecchymosis Location Leg (anterior to staple line)  Ecchymosis Location Orientation Anterior  Ecchymosis Intervention  (ice applied)  Pain Assessment  Date Pain First Started 05/01/18 (fall)  Result of Injury Yes  Pain Screening  Clinical Progression Other  (Comment) (secondary to fall pt medicated)  Effect of Pain on Daily Activities unknown  Response to Interventions responded well bleeding controlled pain addressed  Pain Assessment  Work-Related Injury No

## 2018-05-01 NOTE — Discharge Instructions (Signed)
Inpatient Rehab Discharge Instructions  Judith [name] Discharge date and time: No discharge date for patient encounter.   Activities/Precautions/ Functional Status: Activity: activity as tolerated Diet: diabetic diet Wound Care: keep wound clean and dry Functional status:  ___ No restrictions     ___ Walk up steps independently ___ 24/7 supervision/assistance   ___ Walk up steps with assistance ___ Intermittent supervision/assistance  ___ Bathe/dress independently ___ Walk with walker     _x__ Bathe/dress with assistance ___ Walk Independently    ___ Shower independently ___ Walk with assistance    ___ Shower with assistance ___ No alcohol     ___ Return to work/school ________  Special Instructions:    COMMUNITY REFERRALS UPON DISCHARGE:    Home Health:   PT, OT, RN  Agency:[facility]   Phone:[phone]   Date of last service:05/02/2018  Medical Equipment/Items [place]   [phone]   GENERAL COMMUNITY RESOURCES FOR PATIENT/FAMILY: Support [place] Thursday @ 7:00-8:30 PM IN [place] 1H105 QUESTIONS CONTACT ROBIN [phone]  My questions have been answered and I understand these instructions. I will adhere to these goals and the provided educational materials after my discharge from the hospital.  Patient/Caregiver Signature _______________________________ Date __________  Clinician Signature _______________________________________ Date __________  Please bring this form and your medication list with you to all your follow-up doctor's appointments.   Information on my medicine - ELIQUIS (apixaban)  This medication education was reviewed with me or my healthcare representative as part of my discharge preparation.  The pharmacist that spoke with me during my hospital stay was:  Onnie Boer, RPH-CPP  Why was Eliquis prescribed for  you? Eliquis was prescribed for you to reduce the risk of a blood clot forming that can cause a stroke if you have a medical condition called atrial fibrillation (a type of irregular heartbeat).  What do You need to know about Eliquis ? Take your Eliquis TWICE DAILY - one tablet in the morning and one tablet in the evening with or without food. If you have difficulty swallowing the tablet whole please discuss with your pharmacist how to take the medication safely.  Take Eliquis exactly as prescribed by your doctor and DO NOT stop taking Eliquis without talking to the doctor who prescribed the medication.  Stopping may increase your risk of developing a stroke.  Refill your prescription before you run out.  After discharge, you should have regular check-up appointments with your healthcare provider that is prescribing your Eliquis.  In the future your dose may need to be changed if your kidney function or weight changes by a significant amount or as you get older.  What do you do if you miss a dose? If you miss a dose, take it as soon as you remember on the same day and resume taking twice daily.  Do not take more than one dose of ELIQUIS at the same time to make up a missed dose.  Important Safety Information A possible side effect of Eliquis is bleeding. You should call your healthcare provider right away if you experience any of the following: ? Bleeding from an injury or your nose that does not stop. ? Unusual colored urine (red or dark brown) or unusual colored stools (red or black). ? Unusual bruising for unknown reasons. ? A serious fall or if you hit your head (even if there is no bleeding).  Some medicines may  interact with Eliquis and might increase your risk of bleeding or clotting while on Eliquis. To help avoid this, consult your healthcare provider or pharmacist prior to using any new prescription or non-prescription medications, including herbals, vitamins, non-steroidal  anti-inflammatory drugs (NSAIDs) and supplements.  This website has more information on Eliquis (apixaban): http://www.eliquis.com/eliquis/home

## 2018-05-01 NOTE — Progress Notes (Signed)
Inpatient Diabetes Program Recommendations  AACE/ADA: New Consensus Statement on Inpatient Glycemic Control (2015)  Target Ranges:  Prepandial:   less than 140 mg/dL      Peak postprandial:   less than 180 mg/dL (1-2 hours)      Critically ill patients:  140 - 180 mg/dL   Lab Results  Component Value Date   GLUCAP 158 (H) 05/01/2018   HGBA1C 8.0 (H) 04/14/2018    Review of Glycemic Control Results for Judith [name], Judith [name] (MRN [id number]) as of 05/01/2018 11:35  Ref. Range 04/30/2018 16:53 04/30/2018 21:19 05/01/2018 06:05  Glucose-Capillary Latest Ref Range: 70 - 99 mg/dL 363 (H) 364 (H) 158 (H)   Diabetes history: DM 2  Outpatient Diabetes medications: Metformin-XR 2000 mg bid, Tresiba 20 mg q evening Current orders for Inpatient glycemic control: Lantus 10 units BID  Inpatient Diabetes Program Recommendations:    Noted discharge summary, however, if patient to remain inpatient, consider adding meal coverage Novolog 3 units TID for post prandials exceeding inpatient goal of 180 mg/dL (assuming that patient is consuming >50% of meal) and adding Novolog 0-9 units TID.  Thanks, Bronson Curb, MSN, RNC-OB Diabetes Coordinator [phone] (8a-5p)

## 2018-05-01 NOTE — Discharge Summary (Addendum)
Judith [name], KLAHR MEDICAL RECORD SH:[id number] ACCOUNT [id number] DATE OF BIRTH:November 20, 1950 FACILITY: [facility]: [facility] PHYSICIAN:Laquasia Pincus, MD  DISCHARGE SUMMARY  DATE OF DISCHARGE:  05/02/2018  DISCHARGE DIAGNOSES: 1.  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   2.  Deep venous thrombosis prophylaxis with Eliquis.   3.  Pain management. 4.  Mood.   5.  Acute blood loss anemia 6.  Ulcerative esophagitis with superficial ulcer.   7.  Atrial fibrillation. 8.  Diabetes mellitus.   9.  Diastolic congestive heart failure.   10.  Chronic kidney disease, stage III.  11.  Coronary artery disease with stenting 12.  Hypothyroidism. 13.  Hyperlipidemia.   14.  Hypertension. 15.  Constipation.  HISTORY OF PRESENT ILLNESS:  This is a 67 year old right-handed female with history of sleep apnea, diastolic congestive heart failure, CAD with stenting, diabetes mellitus, CKD stage III, PAF maintained on Eliquis as well as peripheral vascular disease.   Per chart review, lives with sister, one level home.  Reportedly, independent using a walker prior to admission.  Presented 04/14/2018 with ulcer of the right foot with ischemic changes and no relief with conservative care.  Underwent right transtibial  amputation 04/15/2018 per Dr. Sharol Given.  Wound VAC applied.  HOSPITAL COURSE:  Pain management.  Gastroenterology consulted for anemia with black tarry stools.  Hemoglobin 7.5.  She was transfused.   CT of the abdomen 01/27/2018 unremarkable.  Gastric emptying scan normal study endoscopy  completed.  Significant  for ulcerative esophagitis, superficial ulcer.  Advised continue PPI.    Acute hypoxic respiratory failure secondary to volume overload evident by chest x-ray.  She was diuresed.  She remained on chronic Eliquis as prior to admission.  The patient was admitted for comprehensive rehabilitation program.  PAST MEDICAL HISTORY:  See discharge diagnoses.  SOCIAL HISTORY:  Lives with her  sister.  Used a walker prior to admission.  FUNCTIONAL STATUS:  Upon admission to rehab services was minimal assist squat pivot transfers, moderate assist sit to stand, min mod assist with activities of daily living.  PHYSICAL EXAMINATION: VITAL SIGNS:  Blood pressure 151/72, pulse 103, temperature 97, respirations 19. GENERAL:  Alert female in no acute distress. HEENT:  EOMs intact. NECK:  Supple, nontender, no JVD. CARDIOVASCULAR:  Rate controlled. ABDOMEN:  Soft, nontender, good bowel sounds. LUNGS:  Clear to auscultation without wheeze.  Wound VAC site in place to amputation, appropriately tender.  REHABILITATION HOSPITAL COURSE:  The patient was admitted to inpatient rehabilitation services.  Therapies initiated on a 3-hour daily basis, consisting of physical therapy, occupational therapy and rehabilitation nursing.  The following issues were  addressed during patient's rehabilitation stay:    Pertaining to the patient's right transtibial amputation, surgical site healing nicely.  She would follow up with orthopedic services.  Wound VAC and been discontinued.  She remained on chronic Eliquis for atrial fibrillation.  Cardiac rate controlled.   Pain management with the use of scheduled Neurontin as well as hydrocodone and Robaxin as needed.    Mood stabilization with Prozac, Wellbutrin.  She was attending full therapies.    Acute blood loss anemia at 10.8.  No bleeding episodes.  Follow up per gastroenterology service for ulcerative esophagitis.  She remained on PPI.  No follow up with GI needed.    Blood sugars controlled.  Hemoglobin A1c 7.9.  Insulin therapy is directed with diabetic teaching.    She exhibited no other signs of fluid overload.  Noted history of CAD with stenting.  No chest  pain or increasing shortness of breath.    CKD stage III.  Latest creatinine 1.10.    The patient received weekly collaborative interdisciplinary team conferences to discuss estimated length of  stay, family teaching, any barriers to her discharge.  She can propel her wheelchair supervision.  Transferred to mat with contact guard stand  pivot.  Worked on dynamic standing balance.  Patients seated edge of mat, performed.  Quad strengthening.  Performed sit to stand, rolling walker, contact guard.  Gathered belongings for activities of daily living and homemaking.  Family teaching  completed and planned discharge to home.  DISCHARGE MEDICATIONS:  Included Norvasc 7.5 mg p.o. daily, Eliquis 5 mg p.o. b.i.d., Lipitor 20 mg p.o. daily, Wellbutrin 150 mg p.o. b.i.d., fenofibrate 160 mg p.o. daily, Prozac 20 mg p.o. at bedtime, Lasix 60 mg p.o. daily, Neurontin 900 mg p.o.  b.i.d., Lantus insulin 10 units b.i.d., Tapazole 10 mg p.o. daily, Toprol-XL 25 mg p.o. b.i.d., Protonix 40 mg p.o. b.i.d., Betapace PA-C 120 mg p.o. every 12 hours.  Potassium chloride 20 mg p.o. daily, hydrocodone 1-2 tablets every 4 hours as needed  for pain, Robaxin 500 mg every 6 hours as needed for muscle spasms.  The patient would follow up with Dr. Delice Lesch at the outpatient rehab service office as directed; Dr. [name] call for appointment.  Scarlette Shorts, gastroenterology services; call for appointment as needed; Dr. Golden Hurter, cardiology services, Dr.  Pamella Pert, medical management.  DIET:  Regular.  SPECIAL INSTRUCTIONS:  Keep wound clean and dry.  AN/NUANCE D:05/01/2018 T:05/01/2018 JOB:[id number]  Patient seen and examined by me on day of discharge. Delice Lesch, MD, ABPMR

## 2018-05-01 NOTE — Progress Notes (Addendum)
Occupational Therapy Session Note  Patient Details  Name: Judith [name] MRN: [id number] Date of Birth: 30-Oct-1950 888 Today's Date: 05/01/2018 OT Individual Time: 0800-0900 and 1400-1430 OT Individual Time Calculation (min): 60 min and 30 min   Short Term Goals: Week 2:  OT Short Term Goal 1 (Week 2): STG=LTG due to LOS  Skilled Therapeutic Interventions/Progress Updates:    Session One:Pt seen for OT ADL bathing/dressing session. Pt in supine with RN present assessing surgical site following fall last night. Assist provided to don shrinker, pt with 6/10 pain in R residual limb describing as nerve pain, RN made aware and pt not yet due for pain meds. Pt willing to cont with therapy as able. She transferred to sitting EOB and completed squat pivot transfer to w/c with guarding assist. Completed bathing/dressing routine from w/c level at sink with set-up/supervision from seated position. Stood at sink with steadying assist to pull pants up. VCs required throughout self-care routine to lock w/c brakes.  She completed squat pivot transfer w/c<> drop arm BSC with guarding assist with VCs for hand placement during transfer. Toileting task completed with supervision for managing pants down, able to complete hygiene and pull pants back up at mod I level. Pt returned to w/c at end of session, chair belt alarm donned and RN present.   Session Two: Pt seen for OT session focusing on functional transfers and w/c parts management. Pt in supine upon arrival with RN present administering pain medications. Pt cont with nerve pain in residual limb, 6/10. Pt agreeable to cont with therapy as able.  She required min A for squat pivot transfer EOB> w/c. She self propelled w/c throughout unit with supervision, VCs for effective propulsion technique.  In therapy gym, pt able to correctly set-up and manage all w/c parts in prep for squat pivot transfer mod I. Squat pivot transfer w/c <> EOM with supervision. Pt  returned to room in same manner as described above, left seated with chair belt alarm on and all needs in reach.   Therapy Documentation Precautions:  Precautions Precautions: Fall Precaution Comments: right BKA, wound vac Required Braces or Orthoses: Other Brace Other Brace: limb guard Restrictions Weight Bearing Restrictions: Yes RLE Weight Bearing: Non weight bearing   Therapy/Group: Individual Therapy  Argel Pablo L 05/01/2018, 6:31 AM

## 2018-05-01 NOTE — Significant Event (Signed)
Was the fall witnessed: yes assisted  Patient condition before and after the fall: stable staples intact clean and dry. Stable VS increased pain bleeding from the mid stump.   Patient's reaction to the fall: pt concerned that d/c will be delayed. Reassured that this should not interfere with d/c. Pt had increased nerve and phantom pain  Name of the doctor that was notified including date and time: Dr. Alain Marion [id number]  Any interventions and vital signs: B/P 137/66 (MAP 85) HR 51 POX 100% room air RR 18 Temp 98.9  Pt complained of nausea. Stump was cleansed abd pad and kerlex applied with an ace wrap compression dressing. Pt was medicated with one hydrocodone tylenol 650. Dr Alain Marion wrote order to give neurontin at 0600, an order for zofran, and ice pack prn to affected area. Stump dressing remain clean and dry. Ice packs applied. Stump elevated on pillow pt is resting in bed

## 2018-05-02 ENCOUNTER — Encounter (HOSPITAL_COMMUNITY): Payer: Medicare PPO | Admitting: Occupational Therapy

## 2018-05-02 ENCOUNTER — Ambulatory Visit (HOSPITAL_COMMUNITY): Payer: Medicare PPO | Admitting: Physical Therapy

## 2018-05-02 LAB — GLUCOSE, CAPILLARY: Glucose-Capillary: 138 mg/dL — ABNORMAL HIGH (ref 70–99)

## 2018-05-02 NOTE — Plan of Care (Signed)
  Problem: Consults Goal: RH LIMB LOSS PATIENT EDUCATION Description Description: See Patient Education module for eduction specifics. Outcome: Progressing Goal: Skin Care Protocol Initiated - if Braden Score 18 or less Description If consults are not indicated, leave blank or document N/A Outcome: Progressing   Problem: RH BOWEL ELIMINATION Goal: RH STG MANAGE BOWEL WITH ASSISTANCE Description STG Manage Bowel with [place].  Outcome: Progressing Goal: RH STG MANAGE BOWEL W/MEDICATION W/ASSISTANCE Description STG Manage Bowel with Medication with Assistance. Min  Outcome: Progressing   Problem: RH BLADDER ELIMINATION Goal: RH STG MANAGE BLADDER WITH ASSISTANCE Description STG Manage Bladder With  Min Assistance  Outcome: Progressing   Problem: RH SKIN INTEGRITY Goal: RH STG SKIN FREE OF INFECTION/BREAKDOWN Description Skin free of breakdown and infection with min assist  Outcome: Progressing Goal: RH STG MAINTAIN SKIN INTEGRITY WITH ASSISTANCE Description STG Maintain Skin Integrity With [place].  Outcome: Progressing Goal: RH STG ABLE TO PERFORM INCISION/WOUND CARE W/ASSISTANCE Description STG Able To Perform Incision/Wound Care With Mod Assistance.  Outcome: Progressing   Problem: RH SAFETY Goal: RH STG ADHERE TO SAFETY PRECAUTIONS W/ASSISTANCE/DEVICE Description STG Adhere to Safety Precautions With Min  Assistance/Device.  Outcome: Progressing Goal: RH STG DECREASED RISK OF FALL WITH ASSISTANCE Description STG Decreased Risk of Fall With Assistance. min  Outcome: Progressing   Problem: RH PAIN MANAGEMENT Goal: RH STG PAIN MANAGED AT OR BELOW PT'S PAIN GOAL Description <= 3/10  Outcome: Progressing   Problem: RH KNOWLEDGE DEFICIT LIMB LOSS Goal: RH STG INCREASE KNOWLEDGE OF SELF CARE AFTER LIMB LOSS Description Patient will be able to demonstrate care of residual limb with cues/supervision. Will be able to describe pain management  strategies with cues/handouts  Outcome: Progressing

## 2018-05-02 NOTE — Progress Notes (Signed)
Patient and family discussed all the discharge instructions with PA before they left.

## 2018-05-02 NOTE — Progress Notes (Signed)
Occupational Therapy Discharge Summary  Patient Details  Name: Judith [name] MRN: [id number] Date of Birth: 03-Sep-1950   Patient has met 5 of 5 long term goals due to improved activity tolerance, improved balance, postural control and ability to compensate for deficits.  Patient to discharge at overall Supervision level.  Patient's care partner is independent to provide the necessary physical assistance at discharge.  Pt's sister has completed hands on family training and voices willing and ableness to provide needed assist at d/c.  Pt completing squat pivot transfers at overall supervision-occasional CGA. She requires intermittent cuing for proper set-up of w/c and parts management in prep for transfer. She is completing sponge bathing and toileting tasks seated on drop arm BSC with supervision/ set-up. Not recommending any functional ambulation at d/c and limit standing with RW.   Recommendation:  Patient will benefit from ongoing skilled OT services in home health setting to continue to advance functional skills in the area of BADL and Reduce care partner burden.  Equipment: Drop arm BSC  Reasons for discharge: treatment goals met and discharge from hospital  Patient/family agrees with progress made and goals achieved: Yes  OT Discharge Precautions/Restrictions  Precautions Precautions: Fall Precaution Comments: right BKA Required Braces or Orthoses: Other Brace Other Brace: limb guard Restrictions Weight Bearing Restrictions: Yes RLE Weight Bearing: Non weight bearing Vision Baseline Vision/History: Wears glasses Wears Glasses: At all times Patient Visual Report: No change from baseline Vision Assessment?: No apparent visual deficits Perception  Perception: Within Functional Limits Praxis Praxis: Intact Cognition Overall Cognitive Status: Within Functional Limits for tasks assessed Arousal/Alertness: Awake/alert Orientation Level: Oriented X4 Selective Attention:  Appears intact Memory: Impaired Memory Impairment: Decreased short term memory;Decreased recall of new information Decreased Short Term Memory: Verbal complex;Functional complex Awareness: Appears intact Problem Solving: Appears intact Safety/Judgment: Appears intact Comments: as fatigue increases, so does impulsivity and need for cues for safety  Sensation Sensation Light Touch: Impaired Detail Light Touch Impaired Details: Impaired LLE Proprioception: Appears Intact Additional Comments: some numbness on the lateral aspect of the L foot, sole of foot intact Coordination Gross Motor Movements are Fluid and Coordinated: Yes Fine Motor Movements are Fluid and Coordinated: Yes Heel Shin Test: NT d/t amputation Motor  Motor Motor: Within Functional Limits;Other (comment) Motor - Skilled Clinical Observations: generalized weakness Motor - Discharge Observations: generalized weakness Mobility  Bed Mobility Bed Mobility: Rolling Right;Rolling Left;Supine to Sit;Sit to Supine Rolling Right: Independent Rolling Left: Independent Supine to Sit: Independent Sit to Supine: Independent Transfers Sit to Stand: Contact Guard/Touching assist(RW) Stand to Sit: Contact Guard/Touching assist  Trunk/Postural Assessment  Cervical Assessment Cervical Assessment: Within Functional Limits Thoracic Assessment Thoracic Assessment: Within Functional Limits Lumbar Assessment Lumbar Assessment: Within Functional Limits Postural Control Postural Control: Within Functional Limits  Balance Balance Balance Assessed: Yes Static Sitting Balance Static Sitting - Balance Support: No upper extremity supported;Feet supported Static Sitting - Level of Assistance: 7: Independent Dynamic Sitting Balance Dynamic Sitting - Balance Support: No upper extremity supported;During functional activity;Feet supported Dynamic Sitting - Level of Assistance: 6: Modified independent (Device/Increase time);5: Stand by  assistance Static Standing Balance Static Standing - Balance Support: During functional activity;Right upper extremity supported;Left upper extremity supported Static Standing - Level of Assistance: 5: Stand by assistance Dynamic Standing Balance Dynamic Standing - Balance Support: During functional activity;Right upper extremity supported;Left upper extremity supported Dynamic Standing - Level of Assistance: 5: Stand by assistance;4: Min assist Dynamic Standing - Comments: Standing to complete LB dressing tasks Extremity/Trunk Assessment RUE  Assessment RUE Assessment: Exceptions to [facility] Strength Comments: R rotator cuff tear per pt report  LUE Assessment LUE Assessment: Within Functional Limits   Tuck Dulworth L 05/02/2018, 6:45 AM

## 2018-05-02 NOTE — Progress Notes (Signed)
Physical Therapy Discharge Summary  Patient Details  Name: Judith [name] MRN: [id number] Date of Birth: 1951/01/11  Today's Date: 05/02/2018 PT Individual Time: 4098-1191 PT Individual Time Calculation (min): 55 min    Patient has met 9 of 10 long term goals due to improved activity tolerance, improved balance, improved postural control, increased strength, decreased pain, ability to compensate for deficits and improved awareness.  Patient to discharge at a wheelchair level Supervision.   Patient's care partner is independent to provide the necessary physical assistance at discharge.  Reasons goals not met: Able to ambulate max of 5' with RW d/t fatigue and UE strength deficits; not recommending pt ambulate at home with family, both pt and sister agreeable.   Recommendation:  Patient will benefit from ongoing skilled PT services in home health setting to continue to advance safe functional mobility, address ongoing impairments in strength, balance, activity tolerance, and minimize fall risk.  Equipment: w/c  Reasons for discharge: treatment goals met and discharge from hospital  Patient/family agrees with progress made and goals achieved: Yes  PT Discharge Precautions/Restrictions Precautions Precautions: Fall Precaution Comments: right BKA Required Braces or Orthoses: Other Brace Other Brace: limb guard Restrictions Weight Bearing Restrictions: Yes RLE Weight Bearing: Non weight bearing Vital Signs Therapy Vitals Temp: 98.7 F (37.1 C) Temp Source: Oral Pulse Rate: 62 Resp: 17 BP: 133/81 Patient Position (if appropriate): Lying Oxygen Therapy SpO2: 99 % O2 Device: Room Air Pain   Denies pain Vision/Perception  Perception Perception: Within Functional Limits Praxis Praxis: Intact  Cognition Overall Cognitive Status: Within Functional Limits for tasks assessed Arousal/Alertness: Awake/alert Selective Attention: Appears intact Memory: Impaired Memory  Impairment: Decreased short term memory Awareness: Appears intact Problem Solving: Appears intact Safety/Judgment: Appears intact Sensation Sensation Light Touch: Impaired Detail Light Touch Impaired Details: Impaired LLE Proprioception: Appears Intact Additional Comments: some numbness on the lateral aspect of the L foot, sole of foot intact Coordination Gross Motor Movements are Fluid and Coordinated: Yes Heel Shin Test: NT d/t amputation Motor  Motor Motor: Other (comment) Motor - Skilled Clinical Observations: generalized weakness  Mobility Bed Mobility Bed Mobility: Rolling Right;Rolling Left;Supine to Sit;Sit to Supine Rolling Right: Independent Rolling Left: Independent Supine to Sit: Independent Sit to Supine: Independent Transfers Transfers: Sit to Stand;Stand to Sit;Stand Pivot Transfers;Squat Pivot Transfers Sit to Stand: Contact Guard/Touching assist(RW) Stand to Sit: Contact Guard/Touching assist Stand Pivot Transfers: Contact Guard/Touching assist(RW) Squat Pivot Transfers: Supervision/Verbal cueing Locomotion  Gait Ambulation: Yes Gait Assistance: Contact Guard/Touching assist Gait Distance (Feet): 5 Feet Assistive device: Rolling walker Gait Gait: Yes Gait Pattern: Impaired Gait Pattern: Poor foot clearance - left Gait velocity: significantly decreased Stairs / Additional Locomotion Stairs: No Wheelchair Mobility Wheelchair Mobility: Yes Wheelchair Assistance: Independent with assistive device Wheelchair Propulsion: Both upper extremities;Left lower extremity Wheelchair Parts Management: Independent Distance: 150'  Trunk/Postural Assessment  Cervical Assessment Cervical Assessment: Within Functional Limits Thoracic Assessment Thoracic Assessment: Within Functional Limits Lumbar Assessment Lumbar Assessment: Within Functional Limits Postural Control Postural Control: Within Functional Limits  Balance Balance Balance Assessed: Yes Static  Sitting Balance Static Sitting - Balance Support: No upper extremity supported;Feet supported Static Sitting - Level of Assistance: 7: Independent Dynamic Sitting Balance Dynamic Sitting - Balance Support: No upper extremity supported;During functional activity;Feet supported Dynamic Sitting - Level of Assistance: 6: Modified independent (Device/Increase time) Static Standing Balance Static Standing - Balance Support: Bilateral upper extremity supported;During functional activity Static Standing - Level of Assistance: 5: Stand by assistance Dynamic Standing Balance Dynamic Standing -  Balance Support: Bilateral upper extremity supported Dynamic Standing - Level of Assistance: 5: Stand by assistance Extremity Assessment   BUE WFL for functional tasks   RLE Assessment RLE Assessment: Exceptions to [facility] Passive Range of Motion (PROM) Comments: WFL General Strength Comments: BKA- generalized weakness noted in glutes and hip abductors  LLE Assessment LLE Assessment: Exceptions to [facility] Passive Range of Motion (PROM) Comments: WFL General Strength Comments: generalized weakness, grossly 4/5 throughout  Skilled Therapeutic Intervention: Pt received seated in w/c, denies pain and agreeable to treatment. Pt's sister present for family education. Reviewed recommendations for shrinker/limb guard use and care, LE HEP for strengthening and ROM. Performed car transfer with stand pivot min guard. Pt and sister asking about getting up steps into daughters house; therapist does not recommend going up/down steps at this time d/t difficulty pt would have getting up from sitting at top of steps; pt and sister report understanding. Pt and sister with no further questions or concerns regarding d/c home at this time.   Benjiman Core [facility] 05/02/2018, 6:41 AM

## 2018-05-02 NOTE — Progress Notes (Signed)
Occupational Therapy Session Note  Patient Details  Name: Judith [name] MRN: [id number] Date of Birth: 01-Apr-1951  Today's Date: 05/02/2018 OT Individual Time: 1100-1200 OT Individual Time Calculation (min): 60 min    Short Term Goals: Week 2:  OT Short Term Goal 1 (Week 2): STG=LTG due to LOS  Skilled Therapeutic Interventions/Progress Updates:    Pt seen for OT ADL bathing/dressing session and for caregiver education. Pt sitting up in w/c upon arrival with sister present, this will be her primary caregiver at d/c. Pt agreeable to tx session. Voiced pain in R LE, RN made aware and administered medications at end of session. Pt completed stand/squat pivot transfers throughout session with close supervision and VCs for set-up and hand placement during transfer. She bathed seated on tub bench with supervision, occasional cuing for safety awareness.  She returned to w/c to dress, Indep. For UB dressing, and supervision for LB. She returned to bed to don pants for increased ease and independence, completing bridge in order to advance pants over hips. Educated at length modifieid ADLs and positioning depending on time of day, pain, etc.  Pt directed sister with set-up and transfer from EOB <> drop arm BSC, min cuing required from therapist for safety. Pt with continent void and min A to adjust pants following void.  Pt returned to w/c at end of session, left seated in chair with PA entering and sister present. Throughout session, education provided regarding ADL recommendations of d/c, DME, continuum of care, CLOF and need for supervision- occasional min A level care and need for supervision during all transfers, w/c parts management and safety, pt's high fall risk and return to IADLs. Pt's sister voices and demonstrates willing and ableness to provide needed assist at d/c.   Therapy Documentation Precautions:  Precautions Precautions: Fall Precaution Comments: right BKA, wound vac Required  Braces or Orthoses: Other Brace Other Brace: limb guard Restrictions Weight Bearing Restrictions: Yes RLE Weight Bearing: Non weight bearing   Therapy/Group: Individual Therapy  Parneet Glantz L 05/02/2018, 6:16 AM

## 2018-05-04 ENCOUNTER — Encounter: Payer: Self-pay | Admitting: Physical Medicine & Rehabilitation

## 2018-05-05 ENCOUNTER — Telehealth: Payer: Self-pay | Admitting: *Deleted

## 2018-05-05 ENCOUNTER — Telehealth (INDEPENDENT_AMBULATORY_CARE_PROVIDER_SITE_OTHER): Payer: Self-pay | Admitting: Radiology

## 2018-05-05 NOTE — Telephone Encounter (Signed)
Transitional Care call completed, Appointment confirmed, Address confirmed, New patient packet mailed  Transitional Care Questions   Questions for our staff to ask patients on Transitional care 48 hour phone call:   1. Are you/is patient experiencing any problems since coming home? Pain outer aspect of right knee. Okay when sitting still, painful during transfers and positioning Are there any questions regarding any aspect of care? No  2. Are there any questions regarding medications administration/dosing? No  Are meds being taken as prescribed? Yes Patient should review meds with caller to confirm   3. Have there been any falls? No  4. Has Home Health been to the house and/or have they contacted you? No, I contacted [facility], they stated they are understaffed over the holiday.  They will be calling in the next couple of days If not, have you tried to contact them? Yes Can we help you contact them?   5. Are bowels and bladder emptying properly? Yes Are there any unexpected incontinence issues? No If applicable, is patient following bowel/bladder programs?   6. Any fevers, problems with breathing, unexpected pain?  No  7. Are there any skin problems or new areas of breakdown? No  8. Has the patient/family member arranged specialty MD follow up (ie cardiology/neurology/renal/surgical/etc)? Yes Can we help arrange?   9. Does the patient need any other services or support that we can help arrange?  No  10. Are caregivers following through as expected in assisting the patient?  Yes  11. Has the patient quit smoking, drinking alcohol, or using drugs as recommended? Patient does not do any of these

## 2018-05-05 NOTE — Telephone Encounter (Signed)
FYI

## 2018-05-05 NOTE — Telephone Encounter (Signed)
Transitional Care Call, 1st attempt Left a voicemail message asking patient to please call us back

## 2018-05-05 NOTE — Telephone Encounter (Signed)
[facility] called to inform starting [place] 05/11/17.

## 2018-05-08 ENCOUNTER — Telehealth (INDEPENDENT_AMBULATORY_CARE_PROVIDER_SITE_OTHER): Payer: Self-pay

## 2018-05-08 DIAGNOSIS — E1122 Type 2 diabetes mellitus with diabetic chronic kidney disease: Secondary | ICD-10-CM | POA: Diagnosis not present

## 2018-05-08 DIAGNOSIS — I4819 Other persistent atrial fibrillation: Secondary | ICD-10-CM | POA: Diagnosis not present

## 2018-05-08 DIAGNOSIS — I130 Hypertensive heart and chronic kidney disease with heart failure and stage 1 through stage 4 chronic kidney disease, or unspecified chronic kidney disease: Secondary | ICD-10-CM | POA: Diagnosis not present

## 2018-05-08 DIAGNOSIS — M19042 Primary osteoarthritis, left hand: Secondary | ICD-10-CM | POA: Diagnosis not present

## 2018-05-08 DIAGNOSIS — Z4781 Encounter for orthopedic aftercare following surgical amputation: Secondary | ICD-10-CM | POA: Diagnosis not present

## 2018-05-08 DIAGNOSIS — I5030 Unspecified diastolic (congestive) heart failure: Secondary | ICD-10-CM | POA: Diagnosis not present

## 2018-05-08 DIAGNOSIS — E1151 Type 2 diabetes mellitus with diabetic peripheral angiopathy without gangrene: Secondary | ICD-10-CM | POA: Diagnosis not present

## 2018-05-08 DIAGNOSIS — M19041 Primary osteoarthritis, right hand: Secondary | ICD-10-CM | POA: Diagnosis not present

## 2018-05-08 DIAGNOSIS — N183 Chronic kidney disease, stage 3 (moderate): Secondary | ICD-10-CM | POA: Diagnosis not present

## 2018-05-08 NOTE — Telephone Encounter (Signed)
Dondra Spry with [facility] called and stated that they will be starting Judith [name] with patient today, 05/08/2018.

## 2018-05-09 NOTE — Telephone Encounter (Signed)
Thank you :)

## 2018-05-10 HISTORY — PX: LIPOMA EXCISION: SHX5283

## 2018-05-11 ENCOUNTER — Telehealth (INDEPENDENT_AMBULATORY_CARE_PROVIDER_SITE_OTHER): Payer: Self-pay | Admitting: Orthopedic Surgery

## 2018-05-11 NOTE — Telephone Encounter (Signed)
Judith [name], with [facility] called to request VO for the following:  1x a week for 1 week 2x a week for 4 weeks - for [facility] PT and also for a Home Health aid for the following:  2x a week for 4 weeks.  CB#[phone].  Thank you.

## 2018-05-11 NOTE — Telephone Encounter (Signed)
Called and sw Gracie to advise verbal ok for orders below.

## 2018-05-12 DIAGNOSIS — M19042 Primary osteoarthritis, left hand: Secondary | ICD-10-CM | POA: Diagnosis not present

## 2018-05-12 DIAGNOSIS — I5030 Unspecified diastolic (congestive) heart failure: Secondary | ICD-10-CM | POA: Diagnosis not present

## 2018-05-12 DIAGNOSIS — I4819 Other persistent atrial fibrillation: Secondary | ICD-10-CM | POA: Diagnosis not present

## 2018-05-12 DIAGNOSIS — I130 Hypertensive heart and chronic kidney disease with heart failure and stage 1 through stage 4 chronic kidney disease, or unspecified chronic kidney disease: Secondary | ICD-10-CM | POA: Diagnosis not present

## 2018-05-12 DIAGNOSIS — Z4781 Encounter for orthopedic aftercare following surgical amputation: Secondary | ICD-10-CM | POA: Diagnosis not present

## 2018-05-12 DIAGNOSIS — N183 Chronic kidney disease, stage 3 (moderate): Secondary | ICD-10-CM | POA: Diagnosis not present

## 2018-05-12 DIAGNOSIS — E1151 Type 2 diabetes mellitus with diabetic peripheral angiopathy without gangrene: Secondary | ICD-10-CM | POA: Diagnosis not present

## 2018-05-12 DIAGNOSIS — E1122 Type 2 diabetes mellitus with diabetic chronic kidney disease: Secondary | ICD-10-CM | POA: Diagnosis not present

## 2018-05-12 DIAGNOSIS — M19041 Primary osteoarthritis, right hand: Secondary | ICD-10-CM | POA: Diagnosis not present

## 2018-05-14 ENCOUNTER — Encounter (HOSPITAL_COMMUNITY): Payer: Self-pay | Admitting: Emergency Medicine

## 2018-05-14 ENCOUNTER — Other Ambulatory Visit: Payer: Self-pay

## 2018-05-14 ENCOUNTER — Emergency Department (HOSPITAL_COMMUNITY): Payer: Medicare PPO

## 2018-05-14 ENCOUNTER — Inpatient Hospital Stay (HOSPITAL_COMMUNITY): Payer: Medicare PPO

## 2018-05-14 ENCOUNTER — Inpatient Hospital Stay (HOSPITAL_COMMUNITY)
Admission: EM | Admit: 2018-05-14 | Discharge: 2018-05-23 | DRG: 308 | Disposition: A | Discharge status: Home Health | Payer: Medicare PPO | Attending: Family Medicine | Admitting: Family Medicine

## 2018-05-14 DIAGNOSIS — G40409 Other generalized epilepsy and epileptic syndromes, not intractable, without status epilepticus: Secondary | ICD-10-CM | POA: Diagnosis present

## 2018-05-14 DIAGNOSIS — I4819 Other persistent atrial fibrillation: Secondary | ICD-10-CM | POA: Diagnosis not present

## 2018-05-14 DIAGNOSIS — Z91041 Radiographic dye allergy status: Secondary | ICD-10-CM

## 2018-05-14 DIAGNOSIS — M19042 Primary osteoarthritis, left hand: Secondary | ICD-10-CM | POA: Diagnosis present

## 2018-05-14 DIAGNOSIS — Z8619 Personal history of other infectious and parasitic diseases: Secondary | ICD-10-CM

## 2018-05-14 DIAGNOSIS — E1122 Type 2 diabetes mellitus with diabetic chronic kidney disease: Secondary | ICD-10-CM | POA: Diagnosis present

## 2018-05-14 DIAGNOSIS — N183 Chronic kidney disease, stage 3 unspecified: Secondary | ICD-10-CM | POA: Diagnosis present

## 2018-05-14 DIAGNOSIS — I2510 Atherosclerotic heart disease of native coronary artery without angina pectoris: Secondary | ICD-10-CM | POA: Diagnosis present

## 2018-05-14 DIAGNOSIS — E1142 Type 2 diabetes mellitus with diabetic polyneuropathy: Secondary | ICD-10-CM | POA: Diagnosis present

## 2018-05-14 DIAGNOSIS — E785 Hyperlipidemia, unspecified: Secondary | ICD-10-CM | POA: Diagnosis present

## 2018-05-14 DIAGNOSIS — G4733 Obstructive sleep apnea (adult) (pediatric): Secondary | ICD-10-CM | POA: Diagnosis not present

## 2018-05-14 DIAGNOSIS — K219 Gastro-esophageal reflux disease without esophagitis: Secondary | ICD-10-CM | POA: Diagnosis present

## 2018-05-14 DIAGNOSIS — I2582 Chronic total occlusion of coronary artery: Secondary | ICD-10-CM | POA: Diagnosis present

## 2018-05-14 DIAGNOSIS — F329 Major depressive disorder, single episode, unspecified: Secondary | ICD-10-CM | POA: Diagnosis present

## 2018-05-14 DIAGNOSIS — Z87410 Personal history of cervical dysplasia: Secondary | ICD-10-CM

## 2018-05-14 DIAGNOSIS — H1010 Acute atopic conjunctivitis, unspecified eye: Secondary | ICD-10-CM | POA: Diagnosis not present

## 2018-05-14 DIAGNOSIS — K5900 Constipation, unspecified: Secondary | ICD-10-CM | POA: Diagnosis present

## 2018-05-14 DIAGNOSIS — I25118 Atherosclerotic heart disease of native coronary artery with other forms of angina pectoris: Secondary | ICD-10-CM | POA: Diagnosis not present

## 2018-05-14 DIAGNOSIS — E669 Obesity, unspecified: Secondary | ICD-10-CM | POA: Diagnosis present

## 2018-05-14 DIAGNOSIS — J45909 Unspecified asthma, uncomplicated: Secondary | ICD-10-CM | POA: Diagnosis present

## 2018-05-14 DIAGNOSIS — I4891 Unspecified atrial fibrillation: Secondary | ICD-10-CM | POA: Diagnosis present

## 2018-05-14 DIAGNOSIS — R569 Unspecified convulsions: Secondary | ICD-10-CM | POA: Diagnosis not present

## 2018-05-14 DIAGNOSIS — Z888 Allergy status to other drugs, medicaments and biological substances status: Secondary | ICD-10-CM

## 2018-05-14 DIAGNOSIS — I472 Ventricular tachycardia, unspecified: Secondary | ICD-10-CM

## 2018-05-14 DIAGNOSIS — Z90710 Acquired absence of both cervix and uterus: Secondary | ICD-10-CM

## 2018-05-14 DIAGNOSIS — I483 Typical atrial flutter: Secondary | ICD-10-CM | POA: Diagnosis present

## 2018-05-14 DIAGNOSIS — I080 Rheumatic disorders of both mitral and aortic valves: Secondary | ICD-10-CM | POA: Diagnosis present

## 2018-05-14 DIAGNOSIS — Z7901 Long term (current) use of anticoagulants: Secondary | ICD-10-CM

## 2018-05-14 DIAGNOSIS — Z8249 Family history of ischemic heart disease and other diseases of the circulatory system: Secondary | ICD-10-CM

## 2018-05-14 DIAGNOSIS — E0590 Thyrotoxicosis, unspecified without thyrotoxic crisis or storm: Secondary | ICD-10-CM | POA: Diagnosis present

## 2018-05-14 DIAGNOSIS — R112 Nausea with vomiting, unspecified: Secondary | ICD-10-CM | POA: Diagnosis not present

## 2018-05-14 DIAGNOSIS — M19012 Primary osteoarthritis, left shoulder: Secondary | ICD-10-CM | POA: Diagnosis present

## 2018-05-14 DIAGNOSIS — I480 Paroxysmal atrial fibrillation: Secondary | ICD-10-CM

## 2018-05-14 DIAGNOSIS — G8929 Other chronic pain: Secondary | ICD-10-CM | POA: Diagnosis present

## 2018-05-14 DIAGNOSIS — Z881 Allergy status to other antibiotic agents status: Secondary | ICD-10-CM

## 2018-05-14 DIAGNOSIS — Z955 Presence of coronary angioplasty implant and graft: Secondary | ICD-10-CM

## 2018-05-14 DIAGNOSIS — Z9109 Other allergy status, other than to drugs and biological substances: Secondary | ICD-10-CM

## 2018-05-14 DIAGNOSIS — Z885 Allergy status to narcotic agent status: Secondary | ICD-10-CM

## 2018-05-14 DIAGNOSIS — Z880 Allergy status to penicillin: Secondary | ICD-10-CM

## 2018-05-14 DIAGNOSIS — Z8701 Personal history of pneumonia (recurrent): Secondary | ICD-10-CM

## 2018-05-14 DIAGNOSIS — I10 Essential (primary) hypertension: Secondary | ICD-10-CM | POA: Diagnosis not present

## 2018-05-14 DIAGNOSIS — I130 Hypertensive heart and chronic kidney disease with heart failure and stage 1 through stage 4 chronic kidney disease, or unspecified chronic kidney disease: Secondary | ICD-10-CM | POA: Diagnosis not present

## 2018-05-14 DIAGNOSIS — R413 Other amnesia: Secondary | ICD-10-CM | POA: Diagnosis not present

## 2018-05-14 DIAGNOSIS — R0602 Shortness of breath: Secondary | ICD-10-CM | POA: Diagnosis not present

## 2018-05-14 DIAGNOSIS — Z6826 Body mass index (BMI) 26.0-26.9, adult: Secondary | ICD-10-CM

## 2018-05-14 DIAGNOSIS — Z87891 Personal history of nicotine dependence: Secondary | ICD-10-CM

## 2018-05-14 DIAGNOSIS — R4182 Altered mental status, unspecified: Secondary | ICD-10-CM | POA: Diagnosis not present

## 2018-05-14 DIAGNOSIS — I5042 Chronic combined systolic (congestive) and diastolic (congestive) heart failure: Secondary | ICD-10-CM | POA: Diagnosis present

## 2018-05-14 DIAGNOSIS — Z884 Allergy status to anesthetic agent status: Secondary | ICD-10-CM

## 2018-05-14 DIAGNOSIS — I4892 Unspecified atrial flutter: Secondary | ICD-10-CM | POA: Diagnosis not present

## 2018-05-14 DIAGNOSIS — E860 Dehydration: Secondary | ICD-10-CM | POA: Diagnosis present

## 2018-05-14 DIAGNOSIS — G9341 Metabolic encephalopathy: Secondary | ICD-10-CM | POA: Diagnosis present

## 2018-05-14 DIAGNOSIS — E1151 Type 2 diabetes mellitus with diabetic peripheral angiopathy without gangrene: Secondary | ICD-10-CM | POA: Diagnosis present

## 2018-05-14 DIAGNOSIS — Z79899 Other long term (current) drug therapy: Secondary | ICD-10-CM

## 2018-05-14 DIAGNOSIS — N390 Urinary tract infection, site not specified: Secondary | ICD-10-CM | POA: Diagnosis present

## 2018-05-14 DIAGNOSIS — Z89511 Acquired absence of right leg below knee: Secondary | ICD-10-CM

## 2018-05-14 DIAGNOSIS — F4320 Adjustment disorder, unspecified: Secondary | ICD-10-CM | POA: Diagnosis present

## 2018-05-14 DIAGNOSIS — Z825 Family history of asthma and other chronic lower respiratory diseases: Secondary | ICD-10-CM

## 2018-05-14 DIAGNOSIS — Z794 Long term (current) use of insulin: Secondary | ICD-10-CM

## 2018-05-14 DIAGNOSIS — M19041 Primary osteoarthritis, right hand: Secondary | ICD-10-CM | POA: Diagnosis present

## 2018-05-14 LAB — CBC WITH DIFFERENTIAL/PLATELET
Abs Immature Granulocytes: 0.19 10*3/uL — ABNORMAL HIGH (ref 0.00–0.07)
Basophils Absolute: 0.1 10*3/uL (ref 0.0–0.1)
Basophils Relative: 1 %
Eosinophils Absolute: 0.1 10*3/uL (ref 0.0–0.5)
Eosinophils Relative: 1 %
HCT: 45.5 % (ref 36.0–46.0)
Hemoglobin: 13.9 g/dL (ref 12.0–15.0)
Immature Granulocytes: 2 %
Lymphocytes Relative: 24 %
Lymphs Abs: 3.1 10*3/uL (ref 0.7–4.0)
MCH: 26.9 pg (ref 26.0–34.0)
MCHC: 30.5 g/dL (ref 30.0–36.0)
MCV: 88.2 fL (ref 80.0–100.0)
Monocytes Absolute: 0.9 10*3/uL (ref 0.1–1.0)
Monocytes Relative: 7 %
NRBC: 0 % (ref 0.0–0.2)
Neutro Abs: 8.3 10*3/uL — ABNORMAL HIGH (ref 1.7–7.7)
Neutrophils Relative %: 65 %
Platelets: 624 10*3/uL — ABNORMAL HIGH (ref 150–400)
RBC: 5.16 MIL/uL — ABNORMAL HIGH (ref 3.87–5.11)
RDW: 13.5 % (ref 11.5–15.5)
WBC: 12.6 10*3/uL — ABNORMAL HIGH (ref 4.0–10.5)

## 2018-05-14 LAB — I-STAT VENOUS BLOOD GAS, ED
Acid-base deficit: 2 mmol/L (ref 0.0–2.0)
Bicarbonate: 21.5 mmol/L (ref 20.0–28.0)
O2 Saturation: 72 %
TCO2: 22 mmol/L (ref 22–32)
pCO2, Ven: 32.8 mmHg — ABNORMAL LOW (ref 44.0–60.0)
pH, Ven: 7.425 (ref 7.250–7.430)
pO2, Ven: 37 mmHg (ref 32.0–45.0)

## 2018-05-14 LAB — MAGNESIUM
Magnesium: 1.5 mg/dL — ABNORMAL LOW (ref 1.7–2.4)
Magnesium: 1.5 mg/dL — ABNORMAL LOW (ref 1.7–2.4)

## 2018-05-14 LAB — BASIC METABOLIC PANEL
Anion gap: 21 — ABNORMAL HIGH (ref 5–15)
BUN: 24 mg/dL — ABNORMAL HIGH (ref 8–23)
CO2: 13 mmol/L — ABNORMAL LOW (ref 22–32)
Calcium: 7.6 mg/dL — ABNORMAL LOW (ref 8.9–10.3)
Chloride: 101 mmol/L (ref 98–111)
Creatinine, Ser: 1.13 mg/dL — ABNORMAL HIGH (ref 0.44–1.00)
GFR calc Af Amer: 59 mL/min — ABNORMAL LOW (ref >60)
GFR calc non Af Amer: 51 mL/min — ABNORMAL LOW (ref >60)
Glucose, Bld: 242 mg/dL — ABNORMAL HIGH (ref 70–99)
POTASSIUM: 4.1 mmol/L (ref 3.5–5.1)
Sodium: 135 mmol/L (ref 135–145)

## 2018-05-14 LAB — I-STAT CG4 LACTIC ACID, ED: Lactic Acid, Venous: 2.55 mmol/L (ref 0.5–1.9)

## 2018-05-14 LAB — COMPREHENSIVE METABOLIC PANEL
ALBUMIN: 3.6 g/dL (ref 3.5–5.0)
ALT: 14 U/L (ref 0–44)
AST: 32 U/L (ref 15–41)
Alkaline Phosphatase: 57 U/L (ref 38–126)
Anion gap: 23 — ABNORMAL HIGH (ref 5–15)
BUN: 24 mg/dL — ABNORMAL HIGH (ref 8–23)
CHLORIDE: 97 mmol/L — AB (ref 98–111)
CO2: 13 mmol/L — ABNORMAL LOW (ref 22–32)
Calcium: 7.8 mg/dL — ABNORMAL LOW (ref 8.9–10.3)
Creatinine, Ser: 1.39 mg/dL — ABNORMAL HIGH (ref 0.44–1.00)
GFR calc Af Amer: 46 mL/min — ABNORMAL LOW (ref >60)
GFR calc non Af Amer: 40 mL/min — ABNORMAL LOW (ref >60)
Glucose, Bld: 337 mg/dL — ABNORMAL HIGH (ref 70–99)
Potassium: 3.4 mmol/L — ABNORMAL LOW (ref 3.5–5.1)
Sodium: 133 mmol/L — ABNORMAL LOW (ref 135–145)
Total Bilirubin: 0.6 mg/dL (ref 0.3–1.2)
Total Protein: 8.6 g/dL — ABNORMAL HIGH (ref 6.5–8.1)

## 2018-05-14 LAB — CBG MONITORING, ED
Glucose-Capillary: 246 mg/dL — ABNORMAL HIGH (ref 70–99)
Glucose-Capillary: 330 mg/dL — ABNORMAL HIGH (ref 70–99)

## 2018-05-14 LAB — GLUCOSE, CAPILLARY
Glucose-Capillary: 356 mg/dL — ABNORMAL HIGH (ref 70–99)
Glucose-Capillary: 306 mg/dL — ABNORMAL HIGH (ref 70–99)

## 2018-05-14 LAB — LACTIC ACID, PLASMA
Lactic Acid, Venous: 2.6 mmol/L (ref 0.5–1.9)
Lactic Acid, Venous: 3.1 mmol/L (ref 0.5–1.9)

## 2018-05-14 LAB — TROPONIN I
TROPONIN I: 0.07 ng/mL — AB (ref <0.03)
Troponin I: 0.2 ng/mL (ref <0.03)

## 2018-05-14 LAB — I-STAT TROPONIN, ED: Troponin i, poc: 0.02 ng/mL (ref 0.00–0.08)

## 2018-05-14 LAB — LIPASE, BLOOD: Lipase: 35 U/L (ref 11–51)

## 2018-05-14 MED ORDER — METOPROLOL TARTRATE 5 MG/5ML IV SOLN
5.0000 mg | Freq: Once | INTRAVENOUS | Status: AC
  Administered 2018-05-14: 5 mg via INTRAVENOUS
  Filled 2018-05-14: qty 5

## 2018-05-14 MED ORDER — AMLODIPINE BESYLATE 5 MG PO TABS
7.5000 mg | ORAL_TABLET | Freq: Every day | ORAL | Status: DC
  Administered 2018-05-15 – 2018-05-18 (×4): 7.5 mg via ORAL
  Filled 2018-05-14 (×4): qty 2

## 2018-05-14 MED ORDER — AMIODARONE HCL IN DEXTROSE 360-4.14 MG/200ML-% IV SOLN: 30.0000 mg/h | INTRAVENOUS | Status: DC

## 2018-05-14 MED ORDER — POTASSIUM CHLORIDE CRYS ER 20 MEQ PO TBCR: 40.0000 meq | EXTENDED_RELEASE_TABLET | Freq: Once | ORAL | Status: DC

## 2018-05-14 MED ORDER — SODIUM CHLORIDE 0.9 % IV BOLUS
500.0000 mL | Freq: Once | INTRAVENOUS | Status: AC
  Administered 2018-05-14: 500 mL via INTRAVENOUS

## 2018-05-14 MED ORDER — HYDRALAZINE HCL 20 MG/ML IJ SOLN
5.0000 mg | Freq: Once | INTRAMUSCULAR | Status: AC
  Administered 2018-05-14: 5 mg via INTRAVENOUS
  Filled 2018-05-14: qty 1

## 2018-05-14 MED ORDER — METOCLOPRAMIDE HCL 5 MG/ML IJ SOLN
10.0000 mg | Freq: Once | INTRAMUSCULAR | Status: AC
  Administered 2018-05-14: 10 mg via INTRAVENOUS
  Filled 2018-05-14: qty 2

## 2018-05-14 MED ORDER — HYDRALAZINE HCL 20 MG/ML IJ SOLN: 10.0000 mg | Freq: Four times a day (QID) | INTRAMUSCULAR | Status: DC | PRN

## 2018-05-14 MED ORDER — MAGNESIUM SULFATE 2 GM/50ML IV SOLN
2.0000 g | Freq: Once | INTRAVENOUS | Status: AC
  Administered 2018-05-14: 2 g via INTRAVENOUS
  Filled 2018-05-14: qty 50

## 2018-05-14 MED ORDER — LORAZEPAM 2 MG/ML IJ SOLN
INTRAMUSCULAR | Status: AC
  Administered 2018-05-14: 2 mg
  Filled 2018-05-14: qty 1

## 2018-05-14 MED ORDER — AMIODARONE HCL IN DEXTROSE 360-4.14 MG/200ML-% IV SOLN: 60.0000 mg/h | INTRAVENOUS | Status: DC

## 2018-05-14 MED ORDER — INSULIN ASPART 100 UNIT/ML ~~LOC~~ SOLN: 2.0000 [IU] | SUBCUTANEOUS | Status: DC

## 2018-05-14 MED ORDER — AMIODARONE LOAD VIA INFUSION: 150.0000 mg | Freq: Once | INTRAVENOUS | Status: DC

## 2018-05-14 MED ORDER — AMIODARONE IV BOLUS ONLY 150 MG/100ML
150.0000 mg | Freq: Once | INTRAVENOUS | Status: AC
  Administered 2018-05-14: 150 mg via INTRAVENOUS
  Filled 2018-05-14: qty 100

## 2018-05-14 MED ORDER — INSULIN LISPRO (1 UNIT DIAL) 100 UNIT/ML (KWIKPEN)
2.0000 [IU] | PEN_INJECTOR | SUBCUTANEOUS | Status: DC
  Administered 2018-05-15: 4 [IU] via SUBCUTANEOUS
  Administered 2018-05-15: 2 [IU] via SUBCUTANEOUS
  Administered 2018-05-15: 4 [IU] via SUBCUTANEOUS
  Administered 2018-05-15: 2 [IU] via SUBCUTANEOUS
  Administered 2018-05-15: 6 [IU] via SUBCUTANEOUS
  Administered 2018-05-15 – 2018-05-16 (×2): 4 [IU] via SUBCUTANEOUS
  Administered 2018-05-16: 6 [IU] via SUBCUTANEOUS
  Administered 2018-05-16: 4 [IU] via SUBCUTANEOUS
  Administered 2018-05-16: 6 [IU] via SUBCUTANEOUS
  Administered 2018-05-17: 4 [IU] via SUBCUTANEOUS
  Administered 2018-05-17: 6 [IU] via SUBCUTANEOUS
  Administered 2018-05-17: 4 [IU] via SUBCUTANEOUS
  Administered 2018-05-17: 6 [IU] via SUBCUTANEOUS
  Administered 2018-05-17: 2 [IU] via SUBCUTANEOUS
  Administered 2018-05-17: 8 [IU] via SUBCUTANEOUS
  Administered 2018-05-18: 6 [IU] via SUBCUTANEOUS
  Administered 2018-05-18: 8 [IU] via SUBCUTANEOUS
  Filled 2018-05-14: qty 3

## 2018-05-14 MED ORDER — ONDANSETRON HCL 4 MG/2ML IJ SOLN
INTRAMUSCULAR | Status: AC
  Administered 2018-05-14: 4 mg
  Filled 2018-05-14: qty 2

## 2018-05-14 MED ORDER — METOPROLOL TARTRATE 5 MG/5ML IV SOLN
INTRAVENOUS | Status: AC
  Filled 2018-05-14: qty 5

## 2018-05-14 MED ORDER — TRAMADOL HCL 50 MG PO TABS
50.0000 mg | ORAL_TABLET | Freq: Four times a day (QID) | ORAL | Status: DC | PRN
  Administered 2018-05-14: 50 mg via ORAL
  Filled 2018-05-14 (×2): qty 1

## 2018-05-14 MED ORDER — AMIODARONE IV BOLUS ONLY 150 MG/100ML
150.0000 mg | Freq: Once | INTRAVENOUS | Status: AC
  Administered 2018-05-14: 150 mg via INTRAVENOUS

## 2018-05-14 MED ORDER — AMIODARONE HCL IN DEXTROSE 360-4.14 MG/200ML-% IV SOLN
30.0000 mg/h | INTRAVENOUS | Status: DC
  Administered 2018-05-14 – 2018-05-16 (×4): 30 mg/h via INTRAVENOUS
  Filled 2018-05-14 (×2): qty 200

## 2018-05-14 MED ORDER — ONDANSETRON HCL 4 MG/2ML IJ SOLN: 4.0000 mg | Freq: Four times a day (QID) | INTRAMUSCULAR | Status: DC | PRN

## 2018-05-14 MED ORDER — SODIUM CHLORIDE 0.9% FLUSH: 3.0000 mL | INTRAVENOUS | Status: DC | PRN

## 2018-05-14 MED ORDER — SODIUM CHLORIDE 0.9 % IV SOLN
INTRAVENOUS | Status: DC
  Administered 2018-05-14: 13:00:00 via INTRAVENOUS

## 2018-05-14 MED ORDER — INSULIN LISPRO (1 UNIT DIAL) 100 UNIT/ML (KWIKPEN)
6.0000 [IU] | PEN_INJECTOR | Freq: Once | SUBCUTANEOUS | Status: AC
  Administered 2018-05-14: 6 [IU] via SUBCUTANEOUS
  Filled 2018-05-14: qty 3

## 2018-05-14 MED ORDER — ONDANSETRON HCL 4 MG PO TABS: 4.0000 mg | ORAL_TABLET | Freq: Four times a day (QID) | ORAL | Status: DC | PRN

## 2018-05-14 MED ORDER — SODIUM CHLORIDE 0.9% FLUSH
3.0000 mL | Freq: Two times a day (BID) | INTRAVENOUS | Status: DC
  Administered 2018-05-15 – 2018-05-23 (×14): 3 mL via INTRAVENOUS

## 2018-05-14 MED ORDER — ATORVASTATIN CALCIUM 10 MG PO TABS
20.0000 mg | ORAL_TABLET | Freq: Every day | ORAL | Status: DC
  Administered 2018-05-15 – 2018-05-22 (×9): 20 mg via ORAL
  Filled 2018-05-14 (×8): qty 2

## 2018-05-14 MED ORDER — SODIUM CHLORIDE 0.9 % IV SOLN
250.0000 mL | INTRAVENOUS | Status: DC | PRN
  Administered 2018-05-15 – 2018-05-21 (×2): 250 mL via INTRAVENOUS

## 2018-05-14 MED ORDER — POTASSIUM CHLORIDE 10 MEQ/100ML IV SOLN
10.0000 meq | INTRAVENOUS | Status: AC
  Administered 2018-05-14 (×3): 10 meq via INTRAVENOUS
  Filled 2018-05-14 (×2): qty 100

## 2018-05-14 MED ORDER — INSULIN ASPART 100 UNIT/ML ~~LOC~~ SOLN
10.0000 [IU] | Freq: Once | SUBCUTANEOUS | Status: AC
  Administered 2018-05-14: 10 [IU] via SUBCUTANEOUS

## 2018-05-14 MED ORDER — AMIODARONE HCL IN DEXTROSE 360-4.14 MG/200ML-% IV SOLN
60.0000 mg/h | INTRAVENOUS | Status: AC
  Administered 2018-05-14: 60 mg/h via INTRAVENOUS
  Filled 2018-05-14 (×4): qty 200

## 2018-05-14 NOTE — ED Notes (Signed)
INTERNAL MED PAGED FOR UNASSIGNED ADMISSION

## 2018-05-14 NOTE — ED Notes (Signed)
Called to room by family , pt have seizure like activity , ED MD called to room , pt placed on a NRB ativan 2mg  given IVP cbg 330

## 2018-05-14 NOTE — ED Triage Notes (Signed)
Pt arrives from home s/p witnessed seizure with no hx of same, post ictal on arrival.  Pt's sister reports pt has been c/o malaise, states, "She feels like she's in afib."

## 2018-05-14 NOTE — ED Notes (Signed)
Paged cards per RN [place]

## 2018-05-14 NOTE — Progress Notes (Addendum)
Pt received from ED with A-fib/V-tach, Cardiologist called (Dr. Fransico Him), Amiodarone 150mg  bolus given, 53meq K times 3 in progress, first one is running now, Stat Mg order placed, BMET order is placed after last dose of potassium.pt is alert and oreinted times 2, right BKA, slightly drowsy and restless, maintained position in bed, pt's current HR running upto 150 and once it was down to 50's. Will continue to monitor  Palma Holter, RN

## 2018-05-14 NOTE — ED Notes (Signed)
Talked with Cardiology MD , pt remains in afib with runs of Vt ach , orders to re bolus amiodarone

## 2018-05-14 NOTE — H&P (Signed)
History and Physical    Judith [name] ZOX:[id number] DOB: 1951/03/17 DOA: 05/14/2018  PCP: Rutherford Guys, MD  Patient coming from: home  Chief Complaint: Unresponsiveness  HPI: Judith [name] is a 68 y.o. female with medical history significant of A. fib on sotalol, chronic kidney disease, V. tach in the past, diabetes brought in by family members because of altered mental status.  Patient was initially reported that she may have had a seizure and was a little postictal when she arrived.  She was found to be in V. tach was given metoprolol and amiodarone bolus in the emergency department her rate changed to A. fib with RVR.  Patient's mental status improved and returned pretty much back to baseline.  History of ED visit obtained from nursing staff.  Patient has been normal in the ED since arrival for the last 2 hours.  Approximately 20 minutes ago she is had another seizure and has been given 2 mg of Ativan.  She is currently postictal on a nonrebreather and cannot respond and answer questions.  She has no prior history of seizures.  She has been vomiting and not been able to hold down a lot some of her medications but otherwise no reported fevers.  Patient is being referred for admission for altered mental status of unclear etiology whether due to her arrhythmia versus seizure or both.  Review of Systems: Unobtainable secondary to patient's postictal state  Past Medical History:  Diagnosis Date  . Abnormal EKG 07/31/2013  . Arthritis    "hands" (03/06/2015)  . Asthma   . Carpal tunnel syndrome, bilateral   . Chronic kidney disease   . Complication of anesthesia    slow to wake up. Pt sts she woke up during surgery many years ago.  . Coronary artery disease     2 v CAD with CTO of the RCA and high grade bifurcational LCx/OM stenosis. S/P PCI DES x 2 to the LCx/OM.  Marland Kitchen Diabetic peripheral neuropathy ([place]) "since 1996"  . GERD (gastroesophageal reflux disease)   . Goiter   . Headache    migraines prior to menopause  . History of shingles 06/01/2013  . Hyperlipidemia LDL goal <70 10/13/2015  . Hypertension   . Hyperthyroidism   . Osteomyelitis of foot (HCC)    Right  . PAF (paroxysmal atrial fibrillation) ([place]) 04/29/2015   CHADS2VASC score of 5 now on Apixaban  . Pneumonia ~ 1976  . Sleep apnea    Bipap  . Tremors of nervous system   . Type II diabetes mellitus (HCC)    insulin dependent    Past Surgical History:  Procedure Laterality Date  . ABDOMINAL HYSTERECTOMY  1988   age 61; CERVICAL DYSPLASIA; ovaries intact.   . AMPUTATION Right 01/23/2016   Procedure: Right 3rd Ray Amputation;  Surgeon: [name], MD;  Location: [place];  Service: Orthopedics;  Laterality: Right;  . AMPUTATION Right 02/13/2016   Procedure: Right Transmetatarsal Amputation;  Surgeon: [name], MD;  Location: [place];  Service: Orthopedics;  Laterality: Right;  . AMPUTATION Right 04/15/2018   Procedure: AMPUTATION BELOW KNEE;  Surgeon: [name], MD;  Location: [place];  Service: Orthopedics;  Laterality: Right;  . BIOPSY  04/16/2018   Procedure: BIOPSY;  Surgeon: Irene Shipper, MD;  Location[facility] ENDOSCOPY;  Service: Endoscopy;;  . CARDIAC CATHETERIZATION N/A 02/27/2015   Procedure: Left Heart Cath and Coronary Angiography;  Surgeon: [name], MD; LAD 40%, mCFX 80%, OM 70%,  RCA 100% calcified       . CARDIAC CATHETERIZATION N/A 03/06/2015   Procedure: Coronary Stent Intervention;  Surgeon: [name], MD;  Location: [place] CV LAB;  Service: Cardiovascular;  Laterality: N/A;  Mid CX 3.50x12 promus DES w/ 0% resdual and Prox OM1 2.50x20 promus DES w/ 20% residual  . CARDIOVERSION    . CARPAL TUNNEL RELEASE Right Nov 2015  . CARPAL TUNNEL RELEASE Right 1992; 05/2014   [name]; [place]  . CESAREAN SECTION  1982; 1984  . ESOPHAGOGASTRODUODENOSCOPY (EGD) WITH PROPOFOL N/A 04/16/2018   Procedure: ESOPHAGOGASTRODUODENOSCOPY (EGD) WITH PROPOFOL;  Surgeon: Irene Shipper, MD;   Location: [facility] ENDOSCOPY;  Service: Endoscopy;  Laterality: N/A;  . FOOT NEUROMA SURGERY Bilateral 2000  . I&D EXTREMITY Left 01/06/2018   Procedure: DEBRIDEMENT ULCER LEFT FOOT;  Surgeon: [name], MD;  Location: [place];  Service: Orthopedics;  Laterality: Left;  . KNEE ARTHROSCOPY Right ~ 2003   "meniscus repair"  . SHOULDER OPEN ROTATOR CUFF REPAIR Right 1996; 1998   "w/fracture repair"  . THYROID SURGERY  2000   "removed lots of nodules"  . TONSILLECTOMY  1976     reports that she quit smoking about 3 years ago. Her smoking use included cigarettes. She smoked 0.00 packs per day for 41.00 years. She has never used smokeless tobacco. She reports that she does not drink alcohol or use drugs.  Allergies  Allergen Reactions  . Contrast Media [Iodinated Diagnostic Agents] Hives and Other (See Comments)    Spoke to patient, Iodine allergy is really IV contrast allergy.   [name] [Insulin Aspart] Shortness Of Breath and Other (See Comments)    "breathing problems"  . Propofol Shortness Of Breath    "Breathing problems - asthma attack" Can take with benadryl   . Codeine Nausea And Vomiting and Other (See Comments)    HIGH DOSES-SEVERE VOMITING  . Iodine Other (See Comments)    MUST HAVE BENADRYL PRIOR TO PROCEDURE AND RIGHT BEFORE TREATMENT TO COUNTERACT REACTION-BLISTERING REACTION DERMATOLOGICAL  . Penicillins Itching, Rash and Other (See Comments)    Has patient had a PCN reaction causing immediate rash, facial/tongue/throat swelling, SOB or lightheadedness with hypotension: no Has patient had a PCN reaction causing severe rash involving mucus membranes or skin necrosis: No Has patient had a PCN reaction that required hospitalization No Has patient had a PCN reaction occurring within the last 10 years: No If all of the above answers are "NO", then may proceed with Cephalosporin use.  CHEST SIZED RASH AND ITCHING   . Ace Inhibitors Cough  . Demerol [Meperidine] Nausea And  Vomiting  . Dilaudid [Hydromorphone Hcl] Other (See Comments)    HEADACHE   . Neosporin [Neomycin-Bacitracin Zn-Polymyx] Itching, Rash and Other (See Comments)    MAKES REACTIONS WORSE WHEN USING AS PROPHYLACTIC  . Percocet [Oxycodone-Acetaminophen] Rash  . Tape Itching and Rash    Family History  Problem Relation Age of Onset  . Cancer Mother 50       bronchial cancer  . Breast cancer Mother   . Lung cancer Mother   . Hypertension Father   . COPD Father   . Heart disease Father 90       CAD with cardiac stenting  . Heart attack Father   . Parkinson's disease Father   . Allergies Sister   . Breast cancer Maternal Grandmother   . Emphysema Maternal Grandfather   . Leukemia Paternal Grandmother   . Emphysema Paternal Grandfather   .  Thyroid disease Neg Hx     Prior to Admission medications   Medication Sig Start Date End Date Taking? Authorizing Provider  acetaminophen (TYLENOL) 325 MG tablet Take 1-2 tablets (325-650 mg total) by mouth every 6 (six) hours as needed for mild pain (pain score 1-3 or temp > 100.5). 05/01/18  Yes Angiulli, Lavon Paganini, PA-C  albuterol (PROVENTIL HFA) 108 (90 Base) MCG/ACT inhaler Inhale 1-2 puffs into the lungs every 6 (six) hours as needed for wheezing or shortness of breath. 05/01/18  Yes Angiulli, Lavon Paganini, PA-C  amLODipine (NORVASC) 2.5 MG tablet Take 3 tablets (7.5 mg total) by mouth daily. 05/02/18  Yes Angiulli, Lavon Paganini, PA-C  apixaban (ELIQUIS) 5 MG TABS tablet Take 1 tablet (5 mg total) by mouth 2 (two) times daily. 05/01/18  Yes Angiulli, Lavon Paganini, PA-C  atorvastatin (LIPITOR) 20 MG tablet Take 1 tablet (20 mg total) by mouth daily. 05/01/18  Yes Angiulli, Lavon Paganini, PA-C  buPROPion [facility] SR) 150 MG 12 hr tablet Take 1 tablet (150 mg total) by mouth 2 (two) times daily. 05/01/18  Yes Angiulli, Lavon Paganini, PA-C  fenofibrate 160 MG tablet Take 1 tablet (160 mg total) by mouth daily. 05/02/18  Yes Angiulli, Lavon Paganini, PA-C  FLUoxetine (PROZAC)  20 MG capsule Take 1 capsule (20 mg total) by mouth at bedtime. 05/01/18  Yes Angiulli, Lavon Paganini, PA-C  furosemide (LASIX) 20 MG tablet Take 3 tablets (60 mg total) by mouth daily. 05/01/18 05/01/19 Yes Angiulli, Lavon Paganini, PA-C  gabapentin (NEURONTIN) 300 MG capsule Take 3 capsules (900 mg total) by mouth 2 (two) times daily. TAKE 3 CAPSULES BY MOUTH EVERY  MORNING AND 3 CAPSULES AT BEDTIME Patient taking differently: Take 900 mg by mouth 2 (two) times daily.  05/01/18  Yes Angiulli, Lavon Paganini, PA-C  HYDROcodone-acetaminophen (NORCO/VICODIN) 5-325 MG tablet Take 1-2 tablets by mouth every 4 (four) hours as needed for moderate pain (pain score 4-6). 05/01/18  Yes Angiulli, Lavon Paganini, PA-C  insulin degludec (TRESIBA FLEXTOUCH) 100 UNIT/ML SOPN FlexTouch Pen Inject 0.15 mLs (15 Units total) into the skin daily. Patient taking differently: Inject 20 Units into the skin every evening.  04/03/18  Yes Renato Shin, MD  methimazole (TAPAZOLE) 10 MG tablet Take 1 tablet (10 mg total) by mouth daily. 05/01/18  Yes Angiulli, Lavon Paganini, PA-C  methocarbamol (ROBAXIN) 500 MG tablet Take 1 tablet (500 mg total) by mouth every 6 (six) hours as needed for muscle spasms. 05/01/18  Yes Angiulli, Lavon Paganini, PA-C  metoprolol succinate (TOPROL-XL) 25 MG 24 hr tablet Take 1 tablet (25 mg total) by mouth 2 (two) times daily. Take with or immediately following a meal. 05/01/18  Yes Angiulli, Lavon Paganini, PA-C  nitroGLYCERIN (NITROSTAT) 0.4 MG SL tablet Place 1 tablet (0.4 mg total) under the tongue every 5 (five) minutes as needed for chest pain. 02/10/17  Yes [name], PA-C  pantoprazole (PROTONIX) 40 MG tablet Take 1 tablet (40 mg total) by mouth 2 (two) times daily. 05/01/18  Yes Angiulli, Lavon Paganini, PA-C  potassium chloride SA (K-DUR,KLOR-CON) 20 MEQ tablet Take 1 tablet (20 mEq total) by mouth daily. 05/01/18  Yes Angiulli, Lavon Paganini, PA-C  sotalol (BETAPACE) 120 MG tablet Take 1 tablet (120 mg total) by mouth every 12 (twelve)  hours. 05/01/18  Yes Angiulli, Lavon Paganini, PA-C  glucose blood (TRUE METRIX BLOOD GLUCOSE TEST) test strip Used to check blood sugars 2x daily. 08/25/17   Renato Shin, MD    Physical Exam: Vitals:  05/14/18 1130 05/14/18 1215 05/14/18 1230 05/14/18 1300  BP: (!) 151/89 (!) 195/85 (!) 204/104 (!) 203/106  Pulse: 73 79 76 80  Resp: (!) 25 (!) 25 18 20   Temp:      TempSrc:      SpO2: 97% 99% 100% 100%      Constitutional: Post ictal on nonrebreather with good O2 sats protecting airway Vitals:   05/14/18 1130 05/14/18 1215 05/14/18 1230 05/14/18 1300  BP: (!) 151/89 (!) 195/85 (!) 204/104 (!) 203/106  Pulse: 73 79 76 80  Resp: (!) 25 (!) 25 18 20   Temp:      TempSrc:      SpO2: 97% 99% 100% 100%   Eyes: PERRL, lids and conjunctivae normal ENMT: Mucous membranes are moist. Posterior pharynx clear of any exudate or lesions.Normal dentition.  Neck: normal, supple, no masses, no thyromegaly Respiratory: clear to auscultation bilaterally, no wheezing, no crackles. Normal respiratory effort. No accessory muscle use.  Cardiovascular: Regular rate and rhythm, no murmurs / rubs / gallops. No extremity edema. 2+ pedal pulses. No carotid bruits.  Abdomen: no tenderness, no masses palpated. No hepatosplenomegaly. Bowel sounds positive.  Musculoskeletal: no clubbing / cyanosis. No joint deformity upper and lower extremities. Good ROM, no contractures. Normal muscle tone.  Skin: no rashes, lesions, ulcers. No induration Neurologic: Cannot follow commands moving all extremities no active seizure activity psychiatric: Postictal   Labs on Admission: I have personally reviewed following labs and imaging studies  CBC: Recent Labs  Lab 05/14/18 1052  WBC 12.6*  NEUTROABS 8.3*  HGB 13.9  HCT 45.5  MCV 88.2  PLT 376*   Basic Metabolic Panel: Recent Labs  Lab 05/14/18 1052  NA 133*  K 3.4*  CL 97*  CO2 13*  GLUCOSE 337*  BUN 24*  CREATININE 1.39*  CALCIUM 7.8*   GFR: Estimated  Creatinine Clearance: 34.8 mL/min (A) (by C-G formula based on SCr of 1.39 mg/dL (H)). Liver Function Tests: Recent Labs  Lab 05/14/18 1052  AST 32  ALT 14  ALKPHOS 57  BILITOT 0.6  PROT 8.6*  ALBUMIN 3.6   Recent Labs  Lab 05/14/18 1052  LIPASE 35   No results for input(s): AMMONIA in the last 168 hours. Coagulation Profile: No results for input(s): INR, PROTIME in the last 168 hours. Cardiac Enzymes: No results for input(s): CKTOTAL, CKMB, CKMBINDEX, TROPONINI in the last 168 hours. BNP (last 3 results) No results for input(s): PROBNP in the last 8760 hours. HbA1C: No results for input(s): HGBA1C in the last 72 hours. CBG: Recent Labs  Lab 05/14/18 1012  GLUCAP 246*   Lipid Profile: No results for input(s): CHOL, HDL, LDLCALC, TRIG, CHOLHDL, LDLDIRECT in the last 72 hours. Thyroid Function Tests: No results for input(s): TSH, T4TOTAL, FREET4, T3FREE, THYROIDAB in the last 72 hours. Anemia Panel: No results for input(s): VITAMINB12, FOLATE, FERRITIN, TIBC, IRON, RETICCTPCT in the last 72 hours. Urine analysis:    Component Value Date/Time   COLORURINE AMBER (A) 01/04/2018 2244   APPEARANCEUR CLOUDY (A) 01/04/2018 2244   LABSPEC 1.025 01/04/2018 2244   PHURINE 5.0 01/04/2018 2244   GLUCOSEU 50 (A) 01/04/2018 2244   HGBUR SMALL (A) 01/04/2018 2244   BILIRUBINUR NEGATIVE 01/04/2018 2244   BILIRUBINUR negative 07/22/2017 0858   BILIRUBINUR Small 12/06/2014 1441   KETONESUR 5 (A) 01/04/2018 2244   PROTEINUR 100 (A) 01/04/2018 2244   UROBILINOGEN 0.2 07/22/2017 0858   UROBILINOGEN 1.0 02/12/2015 1238   NITRITE NEGATIVE 01/04/2018 2244  LEUKOCYTESUR NEGATIVE 01/04/2018 2244   Sepsis Labs: !!!!!!!!!!!!!!!!!!!!!!!!!!!!!!!!!!!!!!!!!!!! @LABRCNTIP (procalcitonin:4,lacticidven:4) )No results found for this or any previous visit (from the past 240 hour(s)).   Radiological Exams on Admission: Dg Chest Port 1 View  Result Date: 05/14/2018 CLINICAL DATA:  Onset  shortness of breath and nausea this morning. EXAM: PORTABLE CHEST 1 VIEW COMPARISON:  Single-view of the chest 04/16/2018. PA and lateral chest 06/04/2017. FINDINGS: Defibrillator pad is in place. The lungs are clear. Heart size is normal. No pneumothorax or pleural fluid. No acute or focal bony abnormality. IMPRESSION: No acute disease. Electronically Signed   By: Inge Rise M.D.   On: 05/14/2018 12:34    EKG: Independently reviewed.  Currently normal sinus rhythm Old chart reviewed Have spoken to Dr. Marlou Porch with the cardiology team Have also spoken to Dr. Loanne Drilling with the critical care team Chest x-ray reviewed no edema or infiltrate  Assessment/Plan 68 year old female comes in with altered mental status encephalopathy found to be in V. tach now currently having seizures new onset Principal Problem:   V-tach (HCC)-patient has been in normal sinus rhythm for several hours doubt seizures is secondary to arrhythmia.  Patient was responding and speaking prior to her seizure.  She has been in normal sinus rhythm she is on amiodarone drip continue this.  Cardiology consulted and following.  Will work-up further as below.  Keep potassium and magnesium levels above 4 and 2 consecutively.  Active Problems:   Atrial fibrillation with RVR (HCC)-currently normal sinus rhythm monitor    HTN (hypertension)-holding blood pressure medications currently on amiodarone drip    Memory loss-noted    Chronic combined systolic and diastolic congestive heart failure (HCC)-appears stable and compensated at this time    CKD (chronic kidney disease), stage III (HCC)-at baseline creatinine currently 1.4  New onset seizures-we will rule out other causes.  Chest x-ray is negative no signs of infection at this time.  No rash check urinalysis and urine drug screen.  Have consulted critical care at this time as patient may need to go to ICU instead of stepdown.  Stat CT of head is pending.     DVT prophylaxis:  SCDs Code Status: Full Family Communication: Sister Disposition Plan: Days Consults called: Cardiology and PCCM Admission status: Admission   , A MD [facility]  If 7PM-7AM, please contact night-coverage [email or web address] Password [email or web address]  05/14/2018, 2:25 PM

## 2018-05-14 NOTE — Care Plan (Signed)
PCCM consulted for evaluation due to mental status and potential need for airway protection. On exam, patient awakens to voice and follows commands. She is alert and aware of self. Telemetry with intermittent episodes of VT however remains hemodynamically stable. Currently on amiodarone gtt. No indication for ICU admission at this time.  Rodman Pickle, M.D. [facility] Pulmonary/Critical Care Medicine Pager: [phone] After hours pager: [phone]

## 2018-05-14 NOTE — ED Notes (Signed)
PAGED ADMITTING PER RN  

## 2018-05-14 NOTE — Plan of Care (Signed)
  Problem: Cardiac: Goal: Ability to achieve and maintain adequate cardiopulmonary perfusion will improve Outcome: Progressing   

## 2018-05-14 NOTE — Consult Note (Signed)
Cardiology Consultation:   Patient ID: Judith [name] MRN: [id number]; DOB: 1951-03-12  Admit date: 05/14/2018 Date of Consult: 05/14/2018  Primary Care Provider: Rutherford Guys, MD Primary Cardiologist: Fransico Him, MD  Primary Electrophysiologist:  None Dr Rayann Heman- AF clinic   Patient Profile:   Judith [name] is a 68 y.o. female with a hx of PAF who is being seen today for the evaluation of VT at the request of Dr Maryan Rued.  History of Present Illness:   Judith [name] is a 68 y/o female followed by Dr Radford Pax with a history of with a h/oPA s/p multiple DCCV in the past and most recently maintained in NSR with Sotalol. She has had normal LVF by echo- EF 55-60% Aug 2019 with mild LVH, grade 2 DD, and moderate AS. Her LOV with cardiology was in the AF clinic NOV 5th.  She was maintaining NSR on Sotalol and extra metoprolol for breakthrough PAF. She has been on Eliquis as an OP. Other medical issues include  HTN, ASCAD with CTO of the RCA and high grade LCx/OM stenosis s/p DES x 2 (10/16), HLD, Type II DM,CRI, and hyperthyroidism onmethimazole followed by Dr Loanne Drilling. She has had abdominal pain and nausea, GI work up in the Fall on 2019 included gastric emptying study and CT scan which were unremarkable.  The pt was admitted 04/14/18 with osteomyelitis of the Rt foot.  Admission EKG shows atrial flutter with 2-1 conduction. She was seen by the Hospitalist service and placed on Diltiazem with improvement in her rate but she remained in atrial flutter (Cardiology was not consulted).  On 04/15/18 she had Rt transtibial amputation. Post op course complicated by GI bleeding and anemia requiring transfusion. Endoscopy 04/16/18 showed esophagitis. She also had acute diastolic CHF and had to be diuresed.  Eliquis was resumed but according to the records she remained in AF and was transferred to rehab. She was discharged 05/02/18.  Today her sister was bringing her to the ED for persistent nausea for the  past few days. On the way to the ED to patient had what appeared to be a seizure. EKG in the ED shows runs of AF with aberrancy vs NSVT. The pt's sister does not thin the patient has been taking her medications secondary to nausea.  The pt is awake but somewhat lethargic but responds to questions. She received Amiodarone in the ED and converted to NSR.     Past Medical History:  Diagnosis Date  . Abnormal EKG 07/31/2013  . Arthritis    "hands" (03/06/2015)  . Asthma   . Carpal tunnel syndrome, bilateral   . Chronic kidney disease   . Complication of anesthesia    slow to wake up. Pt sts she woke up during surgery many years ago.  . Coronary artery disease     2 v CAD with CTO of the RCA and high grade bifurcational LCx/OM stenosis. S/P PCI DES x 2 to the LCx/OM.  Marland Kitchen Diabetic peripheral neuropathy ([place]) "since 1996"  . GERD (gastroesophageal reflux disease)   . Goiter   . Headache    migraines prior to menopause  . History of shingles 06/01/2013  . Hyperlipidemia LDL goal <70 10/13/2015  . Hypertension   . Hyperthyroidism   . Osteomyelitis of foot (HCC)    Right  . PAF (paroxysmal atrial fibrillation) ([place]) 04/29/2015   CHADS2VASC score of 5 now on Apixaban  . Pneumonia ~ 1976  . Sleep apnea    Bipap  . Tremors  of nervous system   . Type II diabetes mellitus (HCC)    insulin dependent    Past Surgical History:  Procedure Laterality Date  . ABDOMINAL HYSTERECTOMY  1988   age 56; CERVICAL DYSPLASIA; ovaries intact.   . AMPUTATION Right 01/23/2016   Procedure: Right 3rd Ray Amputation;  Surgeon: [name], MD;  Location: [place];  Service: Orthopedics;  Laterality: Right;  . AMPUTATION Right 02/13/2016   Procedure: Right Transmetatarsal Amputation;  Surgeon: [name], MD;  Location: [place];  Service: Orthopedics;  Laterality: Right;  . AMPUTATION Right 04/15/2018   Procedure: AMPUTATION BELOW KNEE;  Surgeon: [name], MD;  Location: [place];  Service: Orthopedics;   Laterality: Right;  . BIOPSY  04/16/2018   Procedure: BIOPSY;  Surgeon: Irene Shipper, MD;  Location: [facility] ENDOSCOPY;  Service: Endoscopy;;  . CARDIAC CATHETERIZATION N/A 02/27/2015   Procedure: Left Heart Cath and Coronary Angiography;  Surgeon: [name], MD; LAD 40%, mCFX 80%, OM 70%, RCA 100% calcified       . CARDIAC CATHETERIZATION N/A 03/06/2015   Procedure: Coronary Stent Intervention;  Surgeon: [name], MD;  Location: [place] [facility];  Service: Cardiovascular;  Laterality: N/A;  Mid CX 3.50x12 promus DES w/ 0% resdual and Prox OM1 2.50x20 promus DES w/ 20% residual  . CARDIOVERSION    . CARPAL TUNNEL RELEASE Right Nov 2015  . CARPAL TUNNEL RELEASE Right 1992; 05/2014   [name]; [place]  . CESAREAN SECTION  1982; 1984  . ESOPHAGOGASTRODUODENOSCOPY (EGD) WITH PROPOFOL N/A 04/16/2018   Procedure: ESOPHAGOGASTRODUODENOSCOPY (EGD) WITH PROPOFOL;  Surgeon: Irene Shipper, MD;  Location[facility]COPY;  Service: Endoscopy;  Laterality: N/A;  . FOOT NEUROMA SURGERY Bilateral 2000  . I&D EXTREMITY Left 01/06/2018   Procedure: DEBRIDEMENT ULCER LEFT FOOT;  Surgeon: [name], MD;  Location: [place];  Service: Orthopedics;  Laterality: Left;  . KNEE ARTHROSCOPY Right ~ 2003   "meniscus repair"  . SHOULDER OPEN ROTATOR CUFF REPAIR Right 1996; 1998   "w/fracture repair"  . THYROID SURGERY  2000   "removed lots of nodules"  . TONSILLECTOMY  1976     Home Medications:  Prior to Admission medications   Medication Sig Start Date End Date Taking? Authorizing Provider  acetaminophen (TYLENOL) 325 MG tablet Take 1-2 tablets (325-650 mg total) by mouth every 6 (six) hours as needed for mild pain (pain score 1-3 or temp > 100.5). 05/01/18  Yes Angiulli, Lavon Paganini, PA-C  albuterol (PROVENTIL HFA) 108 (90 Base) MCG/ACT inhaler Inhale 1-2 puffs into the lungs every 6 (six) hours as needed for wheezing or shortness of breath. 05/01/18  Yes Angiulli, Lavon Paganini, PA-C  amLODipine (NORVASC) 2.5  MG tablet Take 3 tablets (7.5 mg total) by mouth daily. 05/02/18  Yes Angiulli, Lavon Paganini, PA-C  apixaban (ELIQUIS) 5 MG TABS tablet Take 1 tablet (5 mg total) by mouth 2 (two) times daily. 05/01/18  Yes Angiulli, Lavon Paganini, PA-C  atorvastatin (LIPITOR) 20 MG tablet Take 1 tablet (20 mg total) by mouth daily. 05/01/18  Yes Angiulli, Lavon Paganini, PA-C  buPROPion [facility] SR) 150 MG 12 hr tablet Take 1 tablet (150 mg total) by mouth 2 (two) times daily. 05/01/18  Yes Angiulli, Lavon Paganini, PA-C  fenofibrate 160 MG tablet Take 1 tablet (160 mg total) by mouth daily. 05/02/18  Yes Angiulli, Lavon Paganini, PA-C  FLUoxetine (PROZAC) 20 MG capsule Take 1 capsule (20 mg total) by mouth at bedtime. 05/01/18  Yes Angiulli, Lavon Paganini, PA-C  furosemide (LASIX) 20 MG tablet Take 3 tablets (60 mg total) by mouth daily. 05/01/18 05/01/19 Yes Angiulli, Lavon Paganini, PA-C  gabapentin (NEURONTIN) 300 MG capsule Take 3 capsules (900 mg total) by mouth 2 (two) times daily. TAKE 3 CAPSULES BY MOUTH EVERY  MORNING AND 3 CAPSULES AT BEDTIME Patient taking differently: Take 900 mg by mouth 2 (two) times daily.  05/01/18  Yes Angiulli, Lavon Paganini, PA-C  HYDROcodone-acetaminophen (NORCO/VICODIN) 5-325 MG tablet Take 1-2 tablets by mouth every 4 (four) hours as needed for moderate pain (pain score 4-6). 05/01/18  Yes Angiulli, Lavon Paganini, PA-C  insulin degludec (TRESIBA FLEXTOUCH) 100 UNIT/ML SOPN FlexTouch Pen Inject 0.15 mLs (15 Units total) into the skin daily. Patient taking differently: Inject 20 Units into the skin every evening.  04/03/18  Yes Renato Shin, MD  methimazole (TAPAZOLE) 10 MG tablet Take 1 tablet (10 mg total) by mouth daily. 05/01/18  Yes Angiulli, Lavon Paganini, PA-C  methocarbamol (ROBAXIN) 500 MG tablet Take 1 tablet (500 mg total) by mouth every 6 (six) hours as needed for muscle spasms. 05/01/18  Yes Angiulli, Lavon Paganini, PA-C  metoprolol succinate (TOPROL-XL) 25 MG 24 hr tablet Take 1 tablet (25 mg total) by mouth 2 (two)  times daily. Take with or immediately following a meal. 05/01/18  Yes Angiulli, Lavon Paganini, PA-C  nitroGLYCERIN (NITROSTAT) 0.4 MG SL tablet Place 1 tablet (0.4 mg total) under the tongue every 5 (five) minutes as needed for chest pain. 02/10/17  Yes [name], PA-C  pantoprazole (PROTONIX) 40 MG tablet Take 1 tablet (40 mg total) by mouth 2 (two) times daily. 05/01/18  Yes Angiulli, Lavon Paganini, PA-C  potassium chloride SA (K-DUR,KLOR-CON) 20 MEQ tablet Take 1 tablet (20 mEq total) by mouth daily. 05/01/18  Yes Angiulli, Lavon Paganini, PA-C  sotalol (BETAPACE) 120 MG tablet Take 1 tablet (120 mg total) by mouth every 12 (twelve) hours. 05/01/18  Yes Angiulli, Lavon Paganini, PA-C  glucose blood (TRUE METRIX BLOOD GLUCOSE TEST) test strip Used to check blood sugars 2x daily. 08/25/17   Renato Shin, MD    Inpatient Medications: Scheduled Meds:  Continuous Infusions: . sodium chloride 125 mL/hr at 05/14/18 1251   PRN Meds:   Allergies:    Allergies  Allergen Reactions  . Contrast Media [Iodinated Diagnostic Agents] Hives and Other (See Comments)    Spoke to patient, Iodine allergy is really IV contrast allergy.   [name] [Insulin Aspart] Shortness Of Breath and Other (See Comments)    "breathing problems"  . Propofol Shortness Of Breath    "Breathing problems - asthma attack" Can take with benadryl   . Codeine Nausea And Vomiting and Other (See Comments)    HIGH DOSES-SEVERE VOMITING  . Iodine Other (See Comments)    MUST HAVE BENADRYL PRIOR TO PROCEDURE AND RIGHT BEFORE TREATMENT TO COUNTERACT REACTION-BLISTERING REACTION DERMATOLOGICAL  . Penicillins Itching, Rash and Other (See Comments)    Has patient had a PCN reaction causing immediate rash, facial/tongue/throat swelling, SOB or lightheadedness with hypotension: no Has patient had a PCN reaction causing severe rash involving mucus membranes or skin necrosis: No Has patient had a PCN reaction that required hospitalization No Has patient  had a PCN reaction occurring within the last 10 years: No If all of the above answers are "NO", then may proceed with Cephalosporin use.  CHEST SIZED RASH AND ITCHING   . Ace Inhibitors Cough  . Demerol [Meperidine] Nausea And Vomiting  .  Dilaudid [Hydromorphone Hcl] Other (See Comments)    HEADACHE   . Neosporin [Neomycin-Bacitracin Zn-Polymyx] Itching, Rash and Other (See Comments)    MAKES REACTIONS WORSE WHEN USING AS PROPHYLACTIC  . Percocet [Oxycodone-Acetaminophen] Rash  . Tape Itching and Rash    Social History:   Social History   Socioeconomic History  . Marital status: Single    Spouse name: Not on file  . Number of children: 2  . Years of education: Masters  . Highest education level: Not on file  Occupational History  . Occupation: Landscape architect  Social Needs  . Financial resource strain: Somewhat hard  . Food insecurity:    Worry: Sometimes true    Inability: Sometimes true  . Transportation needs:    Medical: No    Non-medical: No  Tobacco Use  . Smoking status: Former Smoker    Packs/day: 0.00    Years: 41.00    Pack years: 0.00    Types: Cigarettes    Last attempt to quit: 03/05/2015    Years since quitting: 3.1  . Smokeless tobacco: Never Used  . Tobacco comment: 04/29/2015 "quit smoking cigarettes 02/27/2015"  Substance and Sexual Activity  . Alcohol use: No  . Drug use: No  . Sexual activity: Not Currently    Birth control/protection: Post-menopausal, Surgical  Lifestyle  . Physical activity:    Days per week: 0 days    Minutes per session: 0 min  . Stress: Very much  Relationships  . Social connections:    Talks on phone: More than three times a week    Gets together: More than three times a week    Attends religious service: More than 4 times per year    Active member of club or organization: No    Attends meetings of clubs or organizations: Never    Relationship status: Never married  . Intimate partner violence:    Fear of  current or ex partner: No    Emotionally abused: No    Physically abused: No    Forced sexual activity: No  Other Topics Concern  . 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      Lives with sister in [place].      Tobacco: 1 ppd x 41 years - quit 2016      Alcohol: none      Drugs: none      Exercise:  Walking in neighborhood; physical job.   Right-handed.   2 cups caffeine daily.    Family History:    Family History  Problem Relation Age of Onset  . Cancer Mother 41       bronchial cancer  . Breast cancer Mother   . Lung cancer Mother   . Hypertension Father   . COPD Father   . Heart disease Father 12       CAD with cardiac stenting  . Heart attack Father   . Parkinson's disease Father   . Allergies Sister   . Breast cancer Maternal Grandmother   . Emphysema Maternal Grandfather   . Leukemia Paternal Grandmother   . Emphysema Paternal Grandfather   . Thyroid disease Neg Hx      ROS:  Please see the history of present illness.  All other ROS reviewed and negative.     Physical Exam/Data:   Vitals:   05/14/18 1130 05/14/18 1215 05/14/18 1230 05/14/18 1300  BP: (!) 151/89 (!) 195/85 (!) 204/104 (!) 203/106  Pulse: 73 79 76 80  Resp: (!) 25 (!) 25 18 20   Temp:      TempSrc:      SpO2: 97% 99% 100% 100%   No intake or output data in the 24 hours ending 05/14/18 1341 There were no vitals filed for this visit. There is no height or weight on file to calculate BMI.  General:  Overweight, chronically ill appearing female HEENT: normal Lymph: no adenopathy Neck: no JVD Vascular: pulses diminnished Cardiac:  normal S1, S2; RRR; no murmur  Lungs:  clear to auscultation bilaterally, no wheezing, rhonchi or rales  Abd: soft, nontender, no  hepatomegaly  Ext: no edema LLE, Rt BKA Musculoskeletal:  No deformities, BUE and BLE strength weak-poor effort and equal Skin: cool and dry Neuro:  CNs 2-12 intact, no focal abnormalities noted Psych:  lethargic  EKG:  Follow up EKG 10:43 was personally reviewed and demonstrates: NSR- QTc 487 Telemetry:  Telemetry was personally reviewed and demonstrates:  NSR    Laboratory Data:  Chemistry Recent Labs  Lab 05/14/18 1052  NA 133*  K 3.4*  CL 97*  CO2 13*  GLUCOSE 337*  BUN 24*  CREATININE 1.39*  CALCIUM 7.8*  GFRNONAA 40*  GFRAA 46*  ANIONGAP 23*    Recent Labs  Lab 05/14/18 1052  PROT 8.6*  ALBUMIN 3.6  AST 32  ALT 14  ALKPHOS 57  BILITOT 0.6   Hematology Recent Labs  Lab 05/14/18 1052  WBC 12.6*  RBC 5.16*  HGB 13.9  HCT 45.5  MCV 88.2  MCH 26.9  MCHC 30.5  RDW 13.5  PLT 624*   Cardiac EnzymesNo results for input(s): TROPONINI in the last 168 hours.  Recent Labs  Lab 05/14/18 1050  TROPIPOC 0.02    BNPNo results for input(s): BNP, PROBNP in the last 168 hours.  DDimer No results for input(s): DDIMER in the last 168 hours.  Radiology/Studies:  Dg Chest Port 1 View  Result Date: 05/14/2018 CLINICAL DATA:  Onset shortness of breath and nausea this morning. EXAM: PORTABLE CHEST 1 VIEW COMPARISON:  Single-view of the chest 04/16/2018. PA and lateral chest 06/04/2017. FINDINGS: Defibrillator pad is in place. The lungs are clear. Heart size is normal. No pneumothorax or pleural fluid. No acute or focal bony abnormality. IMPRESSION: No acute disease. Electronically Signed   By: Inge Rise M.D.   On: 05/14/2018 12:34    Assessment and Plan:   VT- Pt had a "seizure" en route with WCT- presume VT  PAF- DC Sotalol- continue Amiodarone till seen by EP  Possible sepsis- Recent hospitalization and amputation for osteomyelitis. WBC 12, lactic acid 2.55-per primary service  CAD- H/O PCI 2016- no MI on EKG. Normal LVF by echo Aug  2019  CRI- Slight bump in SCr- probably dehydrated.     Plan: IV Amiodarone. DC Sotolol. Continue Eliquis. EP consult tomorrow. Check echo. Avoid QT prolonging agents. Keep K+ closer to 4.0. Gentle hydration.     [facility] will sign off.   Medication Recommendations:  See above Other recommendations (labs, testing, etc):  Echo Follow up as an outpatient:  TBA  For questions or updates, please contact [facility] Please consult [email or web address] for contact info under     Signed, Kerin Ransom, PA-C  05/14/2018 1:41  PM

## 2018-05-14 NOTE — Progress Notes (Signed)
Paged MD, Dr. Radford Pax again, pt's HR still 145-150, V-tach 5-7 beats every 30 seconds Waiting for response  Palma Holter, [employer]

## 2018-05-14 NOTE — ED Provider Notes (Addendum)
[facility] EMERGENCY DEPARTMENT Provider Note   CSN: [id number] Arrival date & time: 05/14/18  7867     History   Chief Complaint No chief complaint on file.   HPI Judith [name] is a 68 y.o. female.  Patient is a 68 year old female with a history of type 2 diabetes, paroxysmal atrial fibrillation on sotalol and metoprolol, recurrent episodes of nausea and vomiting who is presenting today with her family member after feeling like she is in atrial fibrillation and then an unresponsive episode while in the car.  Family members states that she has been out of the hospital for over a week and has chronic nausea but thinks she took her medicine yesterday.  This morning she was complaining of more nausea and felt like she was in atrial fibrillation.  She did not take any of her medications this morning and when family member was driving her to the hospital she had an episode where she went stiff and started shaking.  Family members unclear how long this went on for.  She has not had diarrhea, fever, cough, shortness of breath.  Unclear how she has been taking her medications.  The history is provided by the patient. The history is limited by the condition of the patient.    Past Medical History:  Diagnosis Date  . Abnormal EKG 07/31/2013  . Arthritis    "hands" (03/06/2015)  . Asthma   . Carpal tunnel syndrome, bilateral   . Chronic kidney disease   . Complication of anesthesia    slow to wake up. Pt sts she woke up during surgery many years ago.  . Coronary artery disease     2 v CAD with CTO of the RCA and high grade bifurcational LCx/OM stenosis. S/P PCI DES x 2 to the LCx/OM.  Marland Kitchen Diabetic peripheral neuropathy ([place]) "since 1996"  . GERD (gastroesophageal reflux disease)   . Goiter   . Headache    migraines prior to menopause  . History of shingles 06/01/2013  . Hyperlipidemia LDL goal <70 10/13/2015  . Hypertension   . Hyperthyroidism   . Osteomyelitis of foot  (HCC)    Right  . PAF (paroxysmal atrial fibrillation) ([place]) 04/29/2015   CHADS2VASC score of 5 now on Apixaban  . Pneumonia ~ 1976  . Sleep apnea    Bipap  . Tremors of nervous system   . Type II diabetes mellitus (HCC)    insulin dependent    Patient Active Problem List   Diagnosis Date Noted  . Slow transit constipation   . Fall   . Labile blood glucose   . Labile blood pressure   . Chronic combined systolic and diastolic congestive heart failure ([place])   . Chronic diastolic congestive heart failure ([place])   . CKD (chronic kidney disease), stage III ([place])   . Type 2 diabetes mellitus with peripheral neuropathy (HCC)   . 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 Post-operative pain   . PAF (paroxysmal atrial fibrillation) ([place])   . Duodenal ulcer   . Atrial fibrillation with RVR ([place]) 04/14/2018  . Foot osteomyelitis, right ([place]) 04/14/2018  . Cutaneous abscess of left foot   . Acute respiratory failure with hypoxia ([place])   . Cellulitis 01/02/2018  . LGI bleed   . Acute blood loss anemia   . Wide-complex tachycardia ([place]) 07/04/2017  . Type II  diabetes mellitus, uncontrolled ([place]) 07/04/2017  . Peripheral neuropathy 07/04/2017  . H/O hyperthyroidism 07/04/2017  . S/P transmetatarsal amputation of foot, right ([place]) 04/19/2016  . Obstructive sleep apnea 11/26/2015  . Bilateral carpal tunnel syndrome 11/26/2015  . Hypomagnesemia 11/16/2015  . Hyperlipidemia LDL goal <70 10/13/2015  . Abnormality of gait 09/02/2015  . Memory loss 08/12/2015  . Diabetic peripheral neuropathy ([place]) 08/12/2015  . Vitamin D deficiency 08/12/2015  . Hyperthyroidism 04/30/2015  . Heme positive stool   . Persistent atrial fibrillation 04/29/2015  . Coronary artery disease with stable angina pectoris ([place]) 03/29/2015  . Abnormal nuclear stress test   . History of  goiter 09/28/2014  . GERD (gastroesophageal reflux disease) 07/31/2013  . Depression with anxiety 06/01/2013  . DM (diabetes mellitus), type 2 with peripheral vascular complications ([place]) 22/06/5425  . HTN (hypertension) 01/30/2013    Past Surgical History:  Procedure Laterality Date  . ABDOMINAL HYSTERECTOMY  1988   age 58; CERVICAL DYSPLASIA; ovaries intact.   . AMPUTATION Right 01/23/2016   Procedure: Right 3rd Ray Amputation;  Surgeon: [name], MD;  Location: [facility];  Service: Orthopedics;  Laterality: Right;  . AMPUTATION Right 02/13/2016   Procedure: Right Transmetatarsal Amputation;  Surgeon: [name], MD;  Location: [facility];  Service: Orthopedics;  Laterality: Right;  . AMPUTATION Right 04/15/2018   Procedure: AMPUTATION BELOW KNEE;  Surgeon: [name], MD;  Location: [facility];  Service: Orthopedics;  Laterality: Right;  . BIOPSY  04/16/2018   Procedure: BIOPSY;  Surgeon: Irene Shipper, MD;  Location: [facility] ENDOSCOPY;  Service: Endoscopy;;  . CARDIAC CATHETERIZATION N/A 02/27/2015   Procedure: Left Heart Cath and Coronary Angiography;  Surgeon: [name], MD; LAD 40%, mCFX 80%, OM 70%, RCA 100% calcified       . CARDIAC CATHETERIZATION N/A 03/06/2015   Procedure: Coronary Stent Intervention;  Surgeon: [name], MD;  Location: [facility] CV LAB;  Service: Cardiovascular;  Laterality: N/A;  Mid CX 3.50x12 promus DES w/ 0% resdual and Prox OM1 2.50x20 promus DES w/ 20% residual  . CARDIOVERSION    . CARPAL TUNNEL RELEASE Right Nov 2015  . CARPAL TUNNEL RELEASE Right 1992; 05/2014   [name]; [place]  . CESAREAN SECTION  1982; 1984  . ESOPHAGOGASTRODUODENOSCOPY (EGD) WITH PROPOFOL N/A 04/16/2018   Procedure: ESOPHAGOGASTRODUODENOSCOPY (EGD) WITH PROPOFOL;  Surgeon: Irene Shipper, MD;  Location: [facility] ENDOSCOPY;  Service: Endoscopy;  Laterality: N/A;  . FOOT NEUROMA SURGERY Bilateral 2000  . I&D EXTREMITY Left 01/06/2018   Procedure: DEBRIDEMENT ULCER LEFT FOOT;   Surgeon: [name], MD;  Location: [facility];  Service: Orthopedics;  Laterality: Left;  . KNEE ARTHROSCOPY Right ~ 2003   "meniscus repair"  . SHOULDER OPEN ROTATOR CUFF REPAIR Right 1996; 1998   "w/fracture repair"  . THYROID SURGERY  2000   "removed lots of nodules"  . TONSILLECTOMY  1976     OB History   No obstetric history on file.      Home Medications    Prior to Admission medications   Medication Sig Start Date End Date Taking? Authorizing Provider  acetaminophen (TYLENOL) 325 MG tablet Take 1-2 tablets (325-650 mg total) by mouth every 6 (six) hours as needed for mild pain (pain score 1-3 or temp > 100.5). 05/01/18   Angiulli, Lavon Paganini, PA-C  albuterol (PROVENTIL HFA) 108 (90 Base) MCG/ACT inhaler Inhale 1-2 puffs into the lungs every 6 (six) hours as needed for wheezing or shortness of breath.  05/01/18   Angiulli, Lavon Paganini, PA-C  amLODipine (NORVASC) 2.5 MG tablet Take 3 tablets (7.5 mg total) by mouth daily. 05/02/18   Angiulli, Lavon Paganini, PA-C  apixaban (ELIQUIS) 5 MG TABS tablet Take 1 tablet (5 mg total) by mouth 2 (two) times daily. 05/01/18   Angiulli, Lavon Paganini, PA-C  atorvastatin (LIPITOR) 20 MG tablet Take 1 tablet (20 mg total) by mouth daily. 05/01/18   Angiulli, Lavon Paganini, PA-C  buPROPion (WELLBUTRIN SR) 150 MG 12 hr tablet Take 1 tablet (150 mg total) by mouth 2 (two) times daily. 05/01/18   Angiulli, Lavon Paganini, PA-C  fenofibrate 160 MG tablet Take 1 tablet (160 mg total) by mouth daily. 05/02/18   Angiulli, Lavon Paganini, PA-C  FLUoxetine (PROZAC) 20 MG capsule Take 1 capsule (20 mg total) by mouth at bedtime. 05/01/18   Angiulli, Lavon Paganini, PA-C  furosemide (LASIX) 20 MG tablet Take 3 tablets (60 mg total) by mouth daily. 05/01/18 05/01/19  Angiulli, Lavon Paganini, PA-C  gabapentin (NEURONTIN) 300 MG capsule Take 3 capsules (900 mg total) by mouth 2 (two) times daily. TAKE 3 CAPSULES BY MOUTH EVERY  MORNING AND 3 CAPSULES AT BEDTIME 05/01/18   Angiulli, Lavon Paganini, PA-C    glucose blood (TRUE METRIX BLOOD GLUCOSE TEST) test strip Used to check blood sugars 2x daily. 08/25/17   Renato Shin, MD  HYDROcodone-acetaminophen (NORCO/VICODIN) 5-325 MG tablet Take 1-2 tablets by mouth every 4 (four) hours as needed for moderate pain (pain score 4-6). 05/01/18   Angiulli, Lavon Paganini, PA-C  insulin degludec (TRESIBA FLEXTOUCH) 100 UNIT/ML SOPN FlexTouch Pen Inject 0.15 mLs (15 Units total) into the skin daily. Patient taking differently: Inject 20 Units into the skin every evening.  04/03/18   Renato Shin, MD  methimazole (TAPAZOLE) 10 MG tablet Take 1 tablet (10 mg total) by mouth daily. 05/01/18   Angiulli, Lavon Paganini, PA-C  methocarbamol (ROBAXIN) 500 MG tablet Take 1 tablet (500 mg total) by mouth every 6 (six) hours as needed for muscle spasms. 05/01/18   Angiulli, Lavon Paganini, PA-C  metoprolol succinate (TOPROL-XL) 25 MG 24 hr tablet Take 1 tablet (25 mg total) by mouth 2 (two) times daily. Take with or immediately following a meal. 05/01/18   Angiulli, Lavon Paganini, PA-C  nitroGLYCERIN (NITROSTAT) 0.4 MG SL tablet Place 1 tablet (0.4 mg total) under the tongue every 5 (five) minutes as needed for chest pain. 02/10/17   [name], PA-C  pantoprazole (PROTONIX) 40 MG tablet Take 1 tablet (40 mg total) by mouth 2 (two) times daily. 05/01/18   Angiulli, Lavon Paganini, PA-C  potassium chloride SA (K-DUR,KLOR-CON) 20 MEQ tablet Take 1 tablet (20 mEq total) by mouth daily. 05/01/18   Angiulli, Lavon Paganini, PA-C  sotalol (BETAPACE) 120 MG tablet Take 1 tablet (120 mg total) by mouth every 12 (twelve) hours. 05/01/18   Angiulli, Lavon Paganini, PA-C    Family History Family History  Problem Relation Age of Onset  . Cancer Mother 43       bronchial cancer  . Breast cancer Mother   . Lung cancer Mother   . Hypertension Father   . COPD Father   . Heart disease Father 1       CAD with cardiac stenting  . Heart attack Father   . Parkinson's disease Father   . Allergies Sister   . Breast cancer  Maternal Grandmother   . Emphysema Maternal Grandfather   . Leukemia Paternal Grandmother   . Emphysema Paternal Grandfather   .  Thyroid disease Neg Hx     Social History Social History   Tobacco Use  . Smoking status: Former Smoker    Packs/day: 0.00    Years: 41.00    Pack years: 0.00    Types: Cigarettes    Last attempt to quit: 03/05/2015    Years since quitting: 3.1  . Smokeless tobacco: Never Used  . Tobacco comment: 04/29/2015 "quit smoking cigarettes 02/27/2015"  Substance Use Topics  . Alcohol use: No  . Drug use: No     Allergies   Contrast media [iodinated diagnostic agents]; Novolog [insulin aspart]; Propofol; Codeine; Iodine; Penicillins; Ace inhibitors; Demerol [meperidine]; Dilaudid [hydromorphone hcl]; Neosporin [neomycin-bacitracin zn-polymyx]; Percocet [oxycodone-acetaminophen]; and Tape   Review of Systems Review of Systems  Unable to perform ROS: Acuity of condition     Physical Exam Updated Vital Signs BP (!) 174/105   Pulse 76   Temp 97.8 F (36.6 C) (Oral)   Resp (!) 21   SpO2 97%   Physical Exam Vitals signs and nursing note reviewed.  Constitutional:      General: She is in acute distress.     Appearance: She is well-developed. She is toxic-appearing.  HENT:     Head: Normocephalic and atraumatic.     Mouth/Throat:     Mouth: Mucous membranes are moist.  Eyes:     Pupils: Pupils are equal, round, and reactive to light.  Cardiovascular:     Rate and Rhythm: Tachycardia present. Rhythm irregularly irregular. Frequent extrasystoles are present.    Heart sounds: Normal heart sounds. No murmur. No friction rub.  Pulmonary:     Effort: Pulmonary effort is normal.     Breath sounds: Normal breath sounds. No wheezing or rales.  Abdominal:     General: Bowel sounds are normal. There is no distension.     Palpations: Abdomen is soft.     Tenderness: There is abdominal tenderness in the epigastric area. There is no guarding or rebound.    Musculoskeletal: Normal range of motion.        General: No tenderness.     Comments: No edema.  Right BKA  Skin:    General: Skin is warm and dry.     Findings: No rash.  Neurological:     Mental Status: She is alert and oriented to person, place, and time.     Cranial Nerves: No cranial nerve deficit.      ED Treatments / Results  Labs (all labs ordered are listed, but only abnormal results are displayed) Labs Reviewed  CBC WITH DIFFERENTIAL/PLATELET - Abnormal; Notable for the following components:      Result Value   WBC 12.6 (*)    RBC 5.16 (*)    Platelets 624 (*)    Neutro Abs 8.3 (*)    Abs Immature Granulocytes 0.19 (*)    All other components within normal limits  COMPREHENSIVE METABOLIC PANEL - Abnormal; Notable for the following components:   Sodium 133 (*)    Potassium 3.4 (*)    Chloride 97 (*)    CO2 13 (*)    Glucose, Bld 337 (*)    BUN 24 (*)    Creatinine, Ser 1.39 (*)    Calcium 7.8 (*)    Total Protein 8.6 (*)    GFR calc non Af Amer 40 (*)    GFR calc Af Amer 46 (*)    Anion gap 23 (*)    All other components within normal limits  CBG  MONITORING, ED - Abnormal; Notable for the following components:   Glucose-Capillary 246 (*)    All other components within normal limits  I-STAT VENOUS BLOOD GAS, ED - Abnormal; Notable for the following components:   pCO2, Ven 32.8 (*)    All other components within normal limits  I-STAT CG4 LACTIC ACID, ED - Abnormal; Notable for the following components:   Lactic Acid, Venous 2.55 (*)    All other components within normal limits  LIPASE, BLOOD  I-STAT TROPONIN, ED    EKG EKG Interpretation  Date/Time:  Sunday May 14 2018 10:42:23 EST Ventricular Rate:  76 PR Interval:    QRS Duration: 97 QT Interval:  433 QTC Calculation: 487 R Axis:   -13 Text Interpretation:  Sinus rhythm Low voltage, precordial leads Consider anterior infarct Minimal ST depression, lateral leads Atrial fibrillation RESOLVED  SINCE PREVIOUS Ventricular tachycardia RESOLVED SINCE PREVIOUS Confirmed by Blanchie Dessert [phone]) on 05/14/2018 11:35:39 AM   Radiology Dg Chest Port 1 View  Result Date: 05/14/2018 CLINICAL DATA:  Onset shortness of breath and nausea this morning. EXAM: PORTABLE CHEST 1 VIEW COMPARISON:  Single-view of the chest 04/16/2018. PA and lateral chest 06/04/2017. FINDINGS: Defibrillator pad is in place. The lungs are clear. Heart size is normal. No pneumothorax or pleural fluid. No acute or focal bony abnormality. IMPRESSION: No acute disease. Electronically Signed   By: Inge Rise M.D.   On: 05/14/2018 12:34    Procedures Procedures (including critical care time)  Medications Ordered in ED Medications  metoprolol tartrate (LOPRESSOR) injection 5 mg (5 mg Intravenous Given 05/14/18 1043)  ondansetron (ZOFRAN) 4 MG/2ML injection (4 mg  Given 05/14/18 1044)  amiodarone (NEXTERONE) IV bolus only 150 mg/100 mL (0 mg Intravenous Stopped 05/14/18 1127)  metoCLOPramide (REGLAN) injection 10 mg (10 mg Intravenous Given 05/14/18 1130)     Initial Impression / Assessment and Plan / ED Course  I have reviewed the triage vital signs and the nursing notes.  Pertinent labs & imaging results that were available during my care of the patient were reviewed by me and considered in my medical decision making (see chart for details).    Patient with multiple medical problems presenting today with abnormal heart rhythm, most likely syncope and persistent nausea and vomiting.  Upon arrival here EKG showed atrial fibrillation with RVR however she was also going in and out of ventricular tachycardia.  She would have no more than 15 beats and then go back in to rhythm.  Patient has not taken her sotalol today but family member think she took it yesterday at least once.  Patient was given IV metoprolol and had multiple episodes of emesis and went into sinus rhythm.  She has been given IV amiodarone.  She continues to  complain of nausea.  She received Zofran and Reglan.  Patient is currently on Zoll pads. Patient denies any infectious symptoms concerning for pneumonia.  She is currently denying any chest pain and repeat EKG in sinus rhythm shows no evidence of ischemia.  Initial troponin is within normal limits and patient's blood sugar is 246.  CBC with normal hemoglobin and mild leukocytosis of 12.6 today.  CMP, lipase and chest x-ray are pending.  12:16 PM CMP with an anion gap of 23, CO2 of 13 and creatinine stable.  Blood sugar is 337.  We will do a VBG.  And a lactate. Pt given gentle fluids and will admit.  Cardiology consulted.  Pt remains in SR. Spoke with Dr. Arlyce Dice  who will consult on the pt.  12:49 PM CXR wnl and lactate mildly elevated at 2.55 and VBG wnl.  Low suspicion for DKA and pt getting fluids and reglan has improved vomiting.  Pt continues to have HTN today and given hydralazine.  2:38 PM Just had a generalized tonic-clonic seizure lasting approximately 3 to 4 minutes and aborted with 2 mg of Ativan.  On repeat EKG now patient appears to be in atrial fibrillation with occasional PVCs but no V. tach at this time.  Patient has no prior history of seizures or benzo or alcohol use.  She is currently postictal.  We will do a head CT to rule out other acute causes.  CRITICAL CARE Performed by: Yocelyn Brocious Total critical care time: 30 minutes Critical care time was exclusive of separately billable procedures and treating other patients. Critical care was necessary to treat or prevent imminent or life-threatening deterioration. Critical care was time spent personally by me on the following activities: development of treatment plan with patient and/or surrogate as well as nursing, discussions with consultants, evaluation of patient's response to treatment, examination of patient, obtaining history from patient or surrogate, ordering and performing treatments and interventions, ordering and  review of laboratory studies, ordering and review of radiographic studies, pulse oximetry and re-evaluation of patient's condition.   Final Clinical Impressions(s) / ED Diagnoses   Final diagnoses:  Intractable vomiting with nausea, unspecified vomiting type  V-tach [facility])  Atrial fibrillation with rapid ventricular response [facility])    ED Discharge Orders    None       Blanchie Dessert, MD 05/14/18 1227    Blanchie Dessert, MD 05/14/18 1250    Blanchie Dessert, MD 05/14/18 1439

## 2018-05-15 ENCOUNTER — Ambulatory Visit (INDEPENDENT_AMBULATORY_CARE_PROVIDER_SITE_OTHER): Payer: Medicare PPO | Admitting: Orthopedic Surgery

## 2018-05-15 ENCOUNTER — Inpatient Hospital Stay (HOSPITAL_COMMUNITY): Payer: Medicare PPO

## 2018-05-15 ENCOUNTER — Inpatient Hospital Stay: Payer: Medicare PPO | Admitting: Family Medicine

## 2018-05-15 ENCOUNTER — Telehealth (INDEPENDENT_AMBULATORY_CARE_PROVIDER_SITE_OTHER): Payer: Self-pay | Admitting: Orthopedic Surgery

## 2018-05-15 DIAGNOSIS — E1151 Type 2 diabetes mellitus with diabetic peripheral angiopathy without gangrene: Secondary | ICD-10-CM

## 2018-05-15 DIAGNOSIS — I4891 Unspecified atrial fibrillation: Secondary | ICD-10-CM

## 2018-05-15 DIAGNOSIS — I5042 Chronic combined systolic (congestive) and diastolic (congestive) heart failure: Secondary | ICD-10-CM

## 2018-05-15 DIAGNOSIS — R569 Unspecified convulsions: Secondary | ICD-10-CM

## 2018-05-15 DIAGNOSIS — I10 Essential (primary) hypertension: Secondary | ICD-10-CM

## 2018-05-15 DIAGNOSIS — N183 Chronic kidney disease, stage 3 (moderate): Secondary | ICD-10-CM

## 2018-05-15 DIAGNOSIS — R413 Other amnesia: Secondary | ICD-10-CM

## 2018-05-15 LAB — URINALYSIS, ROUTINE W REFLEX MICROSCOPIC
Bilirubin Urine: NEGATIVE
Glucose, UA: >=500 mg/dL — AB
Ketones, ur: NEGATIVE mg/dL
Nitrite: NEGATIVE
Protein, ur: 100 mg/dL — AB
Specific Gravity, Urine: 1.017 (ref 1.005–1.030)
WBC, UA: >50 WBC/hpf — ABNORMAL HIGH (ref 0–5)
pH: 5 (ref 5.0–8.0)

## 2018-05-15 LAB — MAGNESIUM
Magnesium: 2 mg/dL (ref 1.7–2.4)
Magnesium: 1.9 mg/dL (ref 1.7–2.4)

## 2018-05-15 LAB — LACTIC ACID, PLASMA: Lactic Acid, Venous: 1.8 mmol/L (ref 0.5–1.9)

## 2018-05-15 LAB — RAPID URINE DRUG SCREEN, HOSP PERFORMED
Amphetamines: NOT DETECTED
Barbiturates: NOT DETECTED
Benzodiazepines: POSITIVE — AB
Cocaine: NOT DETECTED
Opiates: NOT DETECTED
Tetrahydrocannabinol: NOT DETECTED

## 2018-05-15 LAB — GLUCOSE, CAPILLARY
Glucose-Capillary: 197 mg/dL — ABNORMAL HIGH (ref 70–99)
Glucose-Capillary: 131 mg/dL — ABNORMAL HIGH (ref 70–99)
Glucose-Capillary: 134 mg/dL — ABNORMAL HIGH (ref 70–99)
Glucose-Capillary: 161 mg/dL — ABNORMAL HIGH (ref 70–99)
Glucose-Capillary: 207 mg/dL — ABNORMAL HIGH (ref 70–99)
Glucose-Capillary: 167 mg/dL — ABNORMAL HIGH (ref 70–99)
Glucose-Capillary: 176 mg/dL — ABNORMAL HIGH (ref 70–99)

## 2018-05-15 LAB — TSH: TSH: 1.013 u[IU]/mL (ref 0.350–4.500)

## 2018-05-15 LAB — CBC
HCT: 38 % (ref 36.0–46.0)
HEMOGLOBIN: 12.5 g/dL (ref 12.0–15.0)
MCH: 28.1 pg (ref 26.0–34.0)
MCHC: 32.9 g/dL (ref 30.0–36.0)
MCV: 85.4 fL (ref 80.0–100.0)
Platelets: 474 10*3/uL — ABNORMAL HIGH (ref 150–400)
RBC: 4.45 MIL/uL (ref 3.87–5.11)
RDW: 13.9 % (ref 11.5–15.5)
WBC: 13.5 10*3/uL — AB (ref 4.0–10.5)
nRBC: 0 % (ref 0.0–0.2)

## 2018-05-15 LAB — TROPONIN I
Troponin I: 0.68 ng/mL (ref <0.03)
Troponin I: 1.16 ng/mL (ref <0.03)

## 2018-05-15 LAB — POTASSIUM: Potassium: 3.6 mmol/L (ref 3.5–5.1)

## 2018-05-15 MED ORDER — INSULIN DEGLUDEC 100 UNIT/ML ~~LOC~~ SOPN: 10.0000 [IU] | PEN_INJECTOR | Freq: Every day | SUBCUTANEOUS | Status: DC

## 2018-05-15 MED ORDER — MAGNESIUM SULFATE 2 GM/50ML IV SOLN
2.0000 g | Freq: Once | INTRAVENOUS | Status: AC
  Administered 2018-05-15: 2 g via INTRAVENOUS
  Filled 2018-05-15: qty 50

## 2018-05-15 MED ORDER — METHIMAZOLE 10 MG PO TABS
10.0000 mg | ORAL_TABLET | Freq: Every day | ORAL | Status: DC
  Administered 2018-05-16 – 2018-05-23 (×8): 10 mg via ORAL
  Filled 2018-05-15 (×9): qty 1

## 2018-05-15 MED ORDER — HYDROCODONE-ACETAMINOPHEN 5-325 MG PO TABS
1.0000 | ORAL_TABLET | Freq: Four times a day (QID) | ORAL | Status: DC | PRN
  Administered 2018-05-15: 0.5 via ORAL
  Administered 2018-05-16 – 2018-05-23 (×11): 1 via ORAL
  Filled 2018-05-15 (×13): qty 1

## 2018-05-15 MED ORDER — INSULIN GLARGINE 100 UNIT/ML ~~LOC~~ SOLN
10.0000 [IU] | Freq: Every day | SUBCUTANEOUS | Status: DC
  Administered 2018-05-15 – 2018-05-17 (×3): 10 [IU] via SUBCUTANEOUS
  Filled 2018-05-15 (×3): qty 0.1

## 2018-05-15 MED ORDER — FLUOXETINE HCL 20 MG PO CAPS
20.0000 mg | ORAL_CAPSULE | Freq: Every day | ORAL | Status: DC
  Administered 2018-05-15 – 2018-05-22 (×8): 20 mg via ORAL
  Filled 2018-05-15 (×8): qty 1

## 2018-05-15 MED ORDER — GABAPENTIN 300 MG PO CAPS
900.0000 mg | ORAL_CAPSULE | Freq: Two times a day (BID) | ORAL | Status: DC
  Administered 2018-05-15 – 2018-05-23 (×16): 900 mg via ORAL
  Filled 2018-05-15 (×17): qty 3

## 2018-05-15 MED ORDER — LEVALBUTEROL HCL 0.63 MG/3ML IN NEBU: 0.6300 mg | INHALATION_SOLUTION | Freq: Three times a day (TID) | RESPIRATORY_TRACT | Status: DC | PRN

## 2018-05-15 MED ORDER — PANTOPRAZOLE SODIUM 40 MG PO TBEC
40.0000 mg | DELAYED_RELEASE_TABLET | Freq: Two times a day (BID) | ORAL | Status: DC
  Administered 2018-05-15 – 2018-05-21 (×12): 40 mg via ORAL
  Filled 2018-05-15 (×12): qty 1

## 2018-05-15 MED ORDER — BUPROPION HCL ER (SR) 150 MG PO TB12
150.0000 mg | ORAL_TABLET | Freq: Two times a day (BID) | ORAL | Status: DC
  Administered 2018-05-15 – 2018-05-23 (×16): 150 mg via ORAL
  Filled 2018-05-15 (×17): qty 1

## 2018-05-15 NOTE — CV Procedure (Signed)
Attempted 2D Echo, MD in room with patient, will try again at a later time.    05/15/18  Judith [name]

## 2018-05-15 NOTE — Progress Notes (Signed)
EEG Completed; Results Pending  

## 2018-05-15 NOTE — Progress Notes (Signed)
PROGRESS NOTE  Judith [name]  QAS:[id number] DOB: Feb 09, 1951 DOA: 05/14/2018 PCP: Rutherford Guys, MD  Brief Narrative: Judith [name] is a 68 y.o. female with a history of AFib, VT, stage III CKD, recent right BKA, and T2DM who presented to the ED with an unresponsive episode. She is living with her sister since undergoing right BKA recently and has been failing to thrive generally, eating less, getting up less, but on 1/5 noted palpitations typical of previous episodes of AFib with RVR. Her sister drove her to the ED when en route the patient had 2-3 minutes of tensing bilateral arms, being unresponsive with eyes rolled in the back of her head, maintaining postural tone in the seat, then began moving all limbs, bumping the BKA stump against the dashboard which would normally cause excruciating pain though didn't seem to cause pain. On arrival she was found to have a wide complex tachycardia, given amiodarone bolus and metoprolol with conversion to narrow AFib with RVR. Mental status had improved when the patient had another episode lasting about 3 minutes terminated with ativan 2mg  IV, postictal-appearing thereafter.    Assessment & Plan: Principal Problem:   V-tach [facility]) Active Problems:   HTN (hypertension)   Memory loss   Atrial fibrillation with RVR (HCC)   Chronic combined systolic and diastolic congestive heart failure (HCC)   CKD (chronic kidney disease), stage III (HCC)   Seizure (HCC)  Acute metabolic encephalopathy: Due to postictal state, possibly related to arrhythmia, also likely an element of acute delirium as she's struggled with that in the hospital in the past. Also has had subacute decline in mood, appetite, etc. following BKA, so suspect depression also contributing on longer time scale. No definite infectious cause identified. Stump site appears well healing, urinalysis pending. - Delirium precautions - Will hold sedating medications as able. Do not want to precipitate  gabapentin withdrawal, so will restart this at previous dose.   Wide complex tachycardia, possibly ventricular tachycardia, persistent AFib with RVR:  - Appreciate cardiology/EP assistance. Continuing amiodarone gtt for now. Has converted to NSR around midnight last night. CHA2DS2-VASc score is 5, will restart eliquis when ok'ed by cardiology. - Echocardiogram pending - Rigorous control of K and Mg (goal >4 and >2 respectively). Will repeat Mg 2g now.  Troponin elevation: Suspect demand ischemia in setting of arrhythmia, also suspected to be etiology of lactic acid elevation.  - Recheck troponin given significant trend upward.   New onset seizures: No intracerebral abnormalities, EEG the following day is interpreted as a normal EEG in sleeping and awake states.  - Will stop tramadol.  - prn ativan  Nausea: Waxing/waning PTA. Has been evaluated by GI.  - Antiemetics  Osteoarthritis of left shoulder, chronic pain, s/p right BKA: Normal wound healing. - Not many options for pain management with allergies to codeine, dilaudid, oxycodone, as well as CKD precluding NSAIDs and seizures precluding tramadol. Will trial hydrocodone prn. - Orthopedic follow up as scheduled.   IDT2DM: Last HBA1c 8% - Cut long acting insulin down to 10 from 20u qHS and cover with SSI  Stage III CKD:  - Monitor, avoid nephrotoxins.   HTN:  - Monitor in SDU, continue norvasc.   OSA:  - Continue CPAP  Hyperthyroidism:  - Continue tapazole. TSH wnl.  Depression with adjustment disorder:  - Continue home medications - Needs outpatient therapy  DVT prophylaxis: SCDs Code Status: Full Family Communication: Sister at bedside Disposition Plan: Uncertain  Consultants:   Cardiology/EP  Procedures:  Echocardiogram pending  Antimicrobials:  None   Subjective: Very few complaints directly to me, but sister at bedside prods her to mention left shoulder pain which is moderate, not controlled now that  she's off medications. She denies chest pain, dyspnea, palpitations. No current nausea or vomiting.   Objective: Vitals:   05/15/18 1442 05/15/18 1500 05/15/18 1600 05/15/18 1626  BP:  (!) 143/82    Pulse: 81 76 77   Resp: (!) 26 15 13    Temp:    98.5 F (36.9 C)  TempSrc:    Oral  SpO2: 99% 100% 100%     Intake/Output Summary (Last 24 hours) at 05/15/2018 1651 Last data filed at 05/15/2018 1448 Gross per 24 hour  Intake 2085.16 ml  Output 300 ml  Net 1785.16 ml   There were no vitals filed for this visit.  Gen: 68 y.o. female in no distress  Pulm: Non-labored breathing room air. Clear to auscultation bilaterally.  CV: Regular rate and rhythm. No murmur, rub, or gallop. NO JVD, no pedal edema. GI: Abdomen soft, non-tender, non-distended, with normoactive bowel sounds. No organomegaly or masses felt. Ext: Warm, right BKA with no dehiscence, minimal erythema, mildly tender to palpation, no discharge, staples in place. Skin: Except as above, no other rashes, lesions or ulcers Neuro: Alert, partially oriented. No focal neurological deficits. Psych: Judgement and insight appear impaired. Mood depressed & affect appropriate.   Data Reviewed: I have personally reviewed following labs and imaging studies  CBC: Recent Labs  Lab 05/14/18 1052 05/15/18 0215  WBC 12.6* 13.5*  NEUTROABS 8.3*  --   HGB 13.9 12.5  HCT 45.5 38.0  MCV 88.2 85.4  PLT 624* 814*   Basic Metabolic Panel: Recent Labs  Lab 05/14/18 1052 05/14/18 1909 05/14/18 2046 05/14/18 2257 05/15/18 0215 05/15/18 1103  NA 133*  --  135  --   --   --   K 3.4*  --  4.1  --  3.6  --   CL 97*  --  101  --   --   --   CO2 13*  --  13*  --   --   --   GLUCOSE 337*  --  242*  --   --   --   BUN 24*  --  24*  --   --   --   CREATININE 1.39*  --  1.13*  --   --   --   CALCIUM 7.8*  --  7.6*  --   --   --   MG  --  1.5*  --  1.5* 2.0 1.9   GFR: CrCl cannot be calculated (Unknown ideal weight.). Liver Function  Tests: Recent Labs  Lab 05/14/18 1052  AST 32  ALT 14  ALKPHOS 57  BILITOT 0.6  PROT 8.6*  ALBUMIN 3.6   Recent Labs  Lab 05/14/18 1052  LIPASE 35   No results for input(s): AMMONIA in the last 168 hours. Coagulation Profile: No results for input(s): INR, PROTIME in the last 168 hours. Cardiac Enzymes: Recent Labs  Lab 05/14/18 1446 05/14/18 2046 05/15/18 0215  TROPONINI 0.07* 0.20* 0.68*   BNP (last 3 results) No results for input(s): PROBNP in the last 8760 hours. HbA1C: No results for input(s): HGBA1C in the last 72 hours. CBG: Recent Labs  Lab 05/15/18 0107 05/15/18 0402 05/15/18 0829 05/15/18 1119 05/15/18 1625  GLUCAP 131* 134* 161* 207* 167*   Lipid Profile: No results for input(s): CHOL, HDL, LDLCALC,  TRIG, CHOLHDL, LDLDIRECT in the last 72 hours. Thyroid Function Tests: Recent Labs    05/15/18 0215  TSH 1.013   Anemia Panel: No results for input(s): VITAMINB12, FOLATE, FERRITIN, TIBC, IRON, RETICCTPCT in the last 72 hours. Urine analysis:    Component Value Date/Time   COLORURINE YELLOW 05/14/2018 1119   APPEARANCEUR CLOUDY (A) 05/14/2018 1119   LABSPEC 1.017 05/14/2018 1119   PHURINE 5.0 05/14/2018 1119   GLUCOSEU >=500 (A) 05/14/2018 1119   HGBUR SMALL (A) 05/14/2018 1119   BILIRUBINUR NEGATIVE 05/14/2018 1119   BILIRUBINUR negative 07/22/2017 0858   BILIRUBINUR Small 12/06/2014 1441   KETONESUR NEGATIVE 05/14/2018 1119   PROTEINUR 100 (A) 05/14/2018 1119   UROBILINOGEN 0.2 07/22/2017 0858   UROBILINOGEN 1.0 02/12/2015 1238   NITRITE NEGATIVE 05/14/2018 1119   LEUKOCYTESUR LARGE (A) 05/14/2018 1119   No results found for this or any previous visit (from the past 240 hour(s)).    Radiology Studies: Ct Head Wo Contrast  Result Date: 05/14/2018 CLINICAL DATA:  Altered mental status, new onset of seizures, history of chronic kidney disease, coronary artery disease, diabetes mellitus, hypertension EXAM: CT HEAD WITHOUT CONTRAST  TECHNIQUE: Contiguous axial images were obtained from the base of the skull through the vertex without intravenous contrast. Sagittal and coronal MPR images reconstructed from axial data set. COMPARISON:  05/23/2015 FINDINGS: Brain: Motion artifacts on initial imaging, for which repeat imaging was performed. Generalized atrophy. Normal ventricular morphology. No midline shift or mass effect. Small vessel chronic ischemic changes of deep cerebral white matter. Dystrophic calcification identified at the posterior inferior aspect of the LEFT thalamus, unchanged. No intracranial hemorrhage, mass lesion, or evidence of acute infarction. No extra-axial fluid collections. Enlarged empty sella. Vascular: Atherosclerotic calcifications of internal carotid arteries at skull base. No hyperdense vessels. Skull: Intact Sinuses/Orbits: Clear Other: N/A IMPRESSION: Atrophy with small vessel chronic ischemic changes of deep cerebral white matter. Chronic dystrophic calcification at the dorsal inferior LEFT thalamus, question sequela of prior hemorrhage. No acute intracranial abnormalities. Enlarged empty sella. Electronically Signed   By: Lavonia Dana M.D.   On: 05/14/2018 17:40   Dg Chest Port 1 View  Result Date: 05/14/2018 CLINICAL DATA:  Onset shortness of breath and nausea this morning. EXAM: PORTABLE CHEST 1 VIEW COMPARISON:  Single-view of the chest 04/16/2018. PA and lateral chest 06/04/2017. FINDINGS: Defibrillator pad is in place. The lungs are clear. Heart size is normal. No pneumothorax or pleural fluid. No acute or focal bony abnormality. IMPRESSION: No acute disease. Electronically Signed   By: Inge Rise M.D.   On: 05/14/2018 12:34    Scheduled Meds: . amLODipine  7.5 mg Oral Daily  . atorvastatin  20 mg Oral q1800  . buPROPion  150 mg Oral BID  . FLUoxetine  20 mg Oral QHS  . gabapentin  900 mg Oral BID  . insulin lispro  2-6 Units Subcutaneous Q4H  . methimazole  10 mg Oral Daily  . pantoprazole   40 mg Oral BID  . sodium chloride flush  3 mL Intravenous Q12H   Continuous Infusions: . sodium chloride 10 mL/hr at 05/15/18 1400  . amiodarone 30 mg/hr (05/15/18 1623)     LOS: 1 day   Time spent: 35 minutes.  Patrecia Pour, MD [facility] [email or web address] Password [facility] 05/15/2018, 4:51 PM

## 2018-05-15 NOTE — Telephone Encounter (Signed)
[facility]/[phone] called wanting verbal orders for OT  1x week for 1  2x week 4 weeks  Please call to comfirm

## 2018-05-15 NOTE — Procedures (Signed)
ELECTROENCEPHALOGRAM REPORT   Patient: Judith [name]       Room #: 2H11C EEG No. ID: [id number] Age: 68 y.o.        Sex: female Referring Physician: Bonner Puna Report Date:  05/15/2018        Interpreting Physician: Alexis Goodell  History: [name] is an 68 y.o. female with altered mental status  Medications:  Norvasc, Lipitor, Insulin, Amiodarone  Conditions of Recording:  This is a 21 channel routine scalp EEG performed with bipolar and monopolar montages arranged in accordance to the international 10/20 system of electrode placement. One channel was dedicated to EKG recording.  The patient is in the awake and drowsy states.  Description:  The waking background activity consists of a low voltage, symmetrical, fairly well organized, 9 Hz alpha activity, seen from the parieto-occipital and posterior temporal regions.  Low voltage fast activity, poorly organized, is seen anteriorly and is at times superimposed on more posterior regions.  A mixture of theta and alpha rhythms are seen from the central and temporal regions. The patient drowses with slowing to irregular, low voltage theta and beta activity.   Stage II sleep is not obtained. No epileptiform activity is noted.   Hyperventilation and intermittent photic stimulation were not performed   IMPRESSION: Normal electroencephalogram, awake and drowsy. There are no focal lateralizing or epileptiform features.   Alexis Goodell, MD Neurology [phone] 05/15/2018, 12:03 PM

## 2018-05-15 NOTE — Progress Notes (Signed)
Patient converted to sinus rhythm 05/14/2018 2357 hours. Nurse text paged cardiologist.

## 2018-05-15 NOTE — Progress Notes (Signed)
Wasted: 0.5 tablet of hydrocodone-acetaminophen 5/325  Location: Med room A sharp bin  Witnessed: Laddie Aquas, RN

## 2018-05-15 NOTE — Consult Note (Addendum)
Cardiology Consultation:   Patient ID: Judith [name] MRN: [id number]; DOB: Feb 03, 1951  Admit date: 05/14/2018 Date of Consult: 05/15/2018  Primary Care Provider: Rutherford Guys, MD Primary Cardiologist: Fransico Him, MD  Primary Electrophysiologist:  None She has seen Dr. Rayann Heman once while he was covering AFib clinic   Patient Profile:   Judith [name] is a 68 y.o. 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 Harrell Gave.  AFib hx: Sotalol  History of Present Illness:   Ms. Rada was referred to the AFib clinic by D. Turner for management/assist with her AFib, she did on one of her AFib clinic visits see Dr. Rayann Heman, at that time mentions her AFib as likely persistent with a number of failed DCCV, and failure of Sotalol, though since starting BIPAP for her OSA felt like she has much better AFib control, was not interested in ablation/procedure strategies or medicine changes at that time (12/2017).  Her last cardiac contact out patient was again at the AFib clinic in Nov, felt happy with her AFib management/control with the Sotalol since treatment of her OSA was started.  More recently she was admitted 04/14/18 with osteomyelitis of the Rt foot.  Admission EKG showed atrial flutter with 2-1 conduction. She was seen by the Hospitalist service and placed on Diltiazem with improvement in her rate but she remained in atrial flutter (Cardiology was not consulted).  On 04/15/18 she had Rt transtibial amputation. Post op course complicated by GI bleeding and anemia requiring transfusion. Endoscopy 04/16/18 showed esophagitis. She also had acute diastolic CHF and had to be diuresed.  Eliquis was resumed, according to the records she remained in AF and was transferred to rehab. She was  discharged 05/02/18  She came to the ER yesterday via private vehicle, reported to family not feeling well, nauseated, and needed to go to the ER, HPI was limited with some ongoing lethargy..  Reportedly en-route she had what appeared to her sister a seizure, upon arrival to the ER she was noted on telemetry to have AFib w/RVR, episodes of NSVT vs abbarency, cardiology consulted.  She had received amiodarone and metoprolol in the ER that quieted these down, her sotalol stopped, though no appreciation of QT prolongation.  Dr. Harrell Gave worried these were VT given she had not historically had BBB with her rapid AF, and perhaps syncope (vs seizure).  She was admitted to medicine service, noted elevated WBC and lactic acid, with recent amputation for management of other medical issues  LABS K+ 3.4 > 4.1 Mag 1.5 > 1.5 (replaced) BUN/Creat 24/1.39 (baseline Creat looks about 1.0, higher of late) >  poc Trop 0.02 Trop I 0.07 > 0.20 > 0.68 Lactic acid 2.55 > 2.6 . 3.1 WBC 12.6 > 13.5 H/H 13/45 Plts 624 (appears chronic) > 474 TSH 1.013  She tells me that for the prior 3 days she has been very nauseous, no vomiting, no diarrhea, no dizzy spells, near syncope or syncope (until yesterday) She reports taking none of her medicines for 2 days PTA 2/2 to the nausea with very poor oral intake She has not had any cardiac awareness, did not feel like she had been having any AF (even in her prior stay when she was noted in AF), she denies any kind  of CP or SOB.  Denies any dizzy spells, near syncope or syncope (until the ride over) She only c/o R shoulder pain that is chronic with a old injury  Past Medical History:  Diagnosis Date  . Abnormal EKG 07/31/2013  . Arthritis    "hands" (03/06/2015)  . Asthma   . Carpal tunnel syndrome, bilateral   . Chronic kidney disease   . Complication of anesthesia    slow to wake up. Pt sts she woke up during surgery many years ago.  . Coronary artery disease     2  v CAD with CTO of the RCA and high grade bifurcational LCx/OM stenosis. S/P PCI DES x 2 to the LCx/OM.  Marland Kitchen Diabetic peripheral neuropathy ([place]) "since 1996"  . GERD (gastroesophageal reflux disease)   . Goiter   . Headache    migraines prior to menopause  . History of shingles 06/01/2013  . Hyperlipidemia LDL goal <70 10/13/2015  . Hypertension   . Hyperthyroidism   . Osteomyelitis of foot (HCC)    Right  . PAF (paroxysmal atrial fibrillation) ([place]) 04/29/2015   CHADS2VASC score of 5 now on Apixaban  . Pneumonia ~ 1976  . Sleep apnea    Bipap  . Tremors of nervous system   . Type II diabetes mellitus (HCC)    insulin dependent    Past Surgical History:  Procedure Laterality Date  . ABDOMINAL HYSTERECTOMY  1988   age 6; CERVICAL DYSPLASIA; ovaries intact.   . AMPUTATION Right 01/23/2016   Procedure: Right 3rd Ray Amputation;  Surgeon: [name], MD;  Location: [place] [facility];  Service: Orthopedics;  Laterality: Right;  . AMPUTATION Right 02/13/2016   Procedure: Right Transmetatarsal Amputation;  Surgeon: [name], MD;  Location: [place];  Service: Orthopedics;  Laterality: Right;  . AMPUTATION Right 04/15/2018   Procedure: AMPUTATION BELOW KNEE;  Surgeon: [name], MD;  Location: [place];  Service: Orthopedics;  Laterality: Right;  . BIOPSY  04/16/2018   Procedure: BIOPSY;  Surgeon: Irene Shipper, MD;  Location: [facility] ENDOSCOPY;  Service: Endoscopy;;  . CARDIAC CATHETERIZATION N/A 02/27/2015   Procedure: Left Heart Cath and Coronary Angiography;  Surgeon: [name], MD; LAD 40%, mCFX 80%, OM 70%, RCA 100% calcified       . CARDIAC CATHETERIZATION N/A 03/06/2015   Procedure: Coronary Stent Intervention;  Surgeon: [name], MD;  Location: [place] [facility];  Service: Cardiovascular;  Laterality: N/A;  Mid CX 3.50x12 promus DES w/ 0% resdual and Prox OM1 2.50x20 promus DES w/ 20% residual  . CARDIOVERSION    . CARPAL TUNNEL RELEASE Right Nov 2015  . CARPAL TUNNEL RELEASE  Right 1992; 05/2014   [name]; [place]  . CESAREAN SECTION  1982; 1984  . ESOPHAGOGASTRODUODENOSCOPY (EGD) WITH PROPOFOL N/A 04/16/2018   Procedure: ESOPHAGOGASTRODUODENOSCOPY (EGD) WITH PROPOFOL;  Surgeon: Irene Shipper, MD;  Location: [facility] ENDOSCOPY;  Service: Endoscopy;  Laterality: N/A;  . FOOT NEUROMA SURGERY Bilateral 2000  . I&D EXTREMITY Left 01/06/2018   Procedure: DEBRIDEMENT ULCER LEFT FOOT;  Surgeon: [name], MD;  Location: [place];  Service: Orthopedics;  Laterality: Left;  . KNEE ARTHROSCOPY Right ~ 2003   "meniscus repair"  . SHOULDER OPEN ROTATOR CUFF REPAIR Right 1996; 1998   "w/fracture repair"  . THYROID SURGERY  2000   "removed lots of nodules"  . TONSILLECTOMY  1976     Home Medications:  Prior to Admission medications   Medication Sig Start Date  End Date Taking? Authorizing Provider  acetaminophen (TYLENOL) 325 MG tablet Take 1-2 tablets (325-650 mg total) by mouth every 6 (six) hours as needed for mild pain (pain score 1-3 or temp > 100.5). 05/01/18  Yes Angiulli, Lavon Paganini, PA-C  albuterol (PROVENTIL HFA) 108 (90 Base) MCG/ACT inhaler Inhale 1-2 puffs into the lungs every 6 (six) hours as needed for wheezing or shortness of breath. 05/01/18  Yes Angiulli, Lavon Paganini, PA-C  amLODipine (NORVASC) 2.5 MG tablet Take 3 tablets (7.5 mg total) by mouth daily. 05/02/18  Yes Angiulli, Lavon Paganini, PA-C  apixaban (ELIQUIS) 5 MG TABS tablet Take 1 tablet (5 mg total) by mouth 2 (two) times daily. 05/01/18  Yes Angiulli, Lavon Paganini, PA-C  atorvastatin (LIPITOR) 20 MG tablet Take 1 tablet (20 mg total) by mouth daily. 05/01/18  Yes Angiulli, Lavon Paganini, PA-C  buPROPion [facility] SR) 150 MG 12 hr tablet Take 1 tablet (150 mg total) by mouth 2 (two) times daily. 05/01/18  Yes Angiulli, Lavon Paganini, PA-C  fenofibrate 160 MG tablet Take 1 tablet (160 mg total) by mouth daily. 05/02/18  Yes Angiulli, Lavon Paganini, PA-C  FLUoxetine (PROZAC) 20 MG capsule Take 1 capsule (20 mg total) by mouth at  bedtime. 05/01/18  Yes Angiulli, Lavon Paganini, PA-C  furosemide (LASIX) 20 MG tablet Take 3 tablets (60 mg total) by mouth daily. 05/01/18 05/01/19 Yes Angiulli, Lavon Paganini, PA-C  gabapentin (NEURONTIN) 300 MG capsule Take 3 capsules (900 mg total) by mouth 2 (two) times daily. TAKE 3 CAPSULES BY MOUTH EVERY  MORNING AND 3 CAPSULES AT BEDTIME Patient taking differently: Take 900 mg by mouth 2 (two) times daily.  05/01/18  Yes Angiulli, Lavon Paganini, PA-C  HYDROcodone-acetaminophen (NORCO/VICODIN) 5-325 MG tablet Take 1-2 tablets by mouth every 4 (four) hours as needed for moderate pain (pain score 4-6). 05/01/18  Yes Angiulli, Lavon Paganini, PA-C  insulin degludec (TRESIBA FLEXTOUCH) 100 UNIT/ML SOPN FlexTouch Pen Inject 0.15 mLs (15 Units total) into the skin daily. Patient taking differently: Inject 20 Units into the skin every evening.  04/03/18  Yes Renato Shin, MD  methimazole (TAPAZOLE) 10 MG tablet Take 1 tablet (10 mg total) by mouth daily. 05/01/18  Yes Angiulli, Lavon Paganini, PA-C  methocarbamol (ROBAXIN) 500 MG tablet Take 1 tablet (500 mg total) by mouth every 6 (six) hours as needed for muscle spasms. 05/01/18  Yes Angiulli, Lavon Paganini, PA-C  metoprolol succinate (TOPROL-XL) 25 MG 24 hr tablet Take 1 tablet (25 mg total) by mouth 2 (two) times daily. Take with or immediately following a meal. 05/01/18  Yes Angiulli, Lavon Paganini, PA-C  nitroGLYCERIN (NITROSTAT) 0.4 MG SL tablet Place 1 tablet (0.4 mg total) under the tongue every 5 (five) minutes as needed for chest pain. 02/10/17  Yes [name], PA-C  pantoprazole (PROTONIX) 40 MG tablet Take 1 tablet (40 mg total) by mouth 2 (two) times daily. 05/01/18  Yes Angiulli, Lavon Paganini, PA-C  potassium chloride SA (K-DUR,KLOR-CON) 20 MEQ tablet Take 1 tablet (20 mEq total) by mouth daily. 05/01/18  Yes Angiulli, Lavon Paganini, PA-C  sotalol (BETAPACE) 120 MG tablet Take 1 tablet (120 mg total) by mouth every 12 (twelve) hours. 05/01/18  Yes Angiulli, Lavon Paganini, PA-C  glucose  blood (TRUE METRIX BLOOD GLUCOSE TEST) test strip Used to check blood sugars 2x daily. 08/25/17   Renato Shin, MD    Inpatient Medications: Scheduled Meds: . amLODipine  7.5 mg Oral Daily  . atorvastatin  20 mg Oral q1800  .  insulin lispro  2-6 Units Subcutaneous Q4H  . sodium chloride flush  3 mL Intravenous Q12H   Continuous Infusions: . sodium chloride 10 mL/hr at 05/15/18 0600  . amiodarone 30 mg/hr (05/15/18 0600)   PRN Meds: sodium chloride, ondansetron **OR** ondansetron (ZOFRAN) IV, sodium chloride flush, traMADol  Allergies:    Allergies  Allergen Reactions  . Contrast Media [Iodinated Diagnostic Agents] Hives and Other (See Comments)    Spoke to patient, Iodine allergy is really IV contrast allergy.   [name] [Insulin Aspart] Shortness Of Breath and Other (See Comments)    "breathing problems"  . Propofol Shortness Of Breath    "Breathing problems - asthma attack" Can take with benadryl   . Codeine Nausea And Vomiting and Other (See Comments)    HIGH DOSES-SEVERE VOMITING  . Iodine Other (See Comments)    MUST HAVE BENADRYL PRIOR TO PROCEDURE AND RIGHT BEFORE TREATMENT TO COUNTERACT REACTION-BLISTERING REACTION DERMATOLOGICAL  . Penicillins Itching, Rash and Other (See Comments)    Has patient had a PCN reaction causing immediate rash, facial/tongue/throat swelling, SOB or lightheadedness with hypotension: no Has patient had a PCN reaction causing severe rash involving mucus membranes or skin necrosis: No Has patient had a PCN reaction that required hospitalization No Has patient had a PCN reaction occurring within the last 10 years: No If all of the above answers are "NO", then may proceed with Cephalosporin use.  CHEST SIZED RASH AND ITCHING   . Ace Inhibitors Cough  . Demerol [Meperidine] Nausea And Vomiting  . Dilaudid [Hydromorphone Hcl] Other (See Comments)    HEADACHE   . Neosporin [Neomycin-Bacitracin Zn-Polymyx] Itching, Rash and Other (See Comments)     MAKES REACTIONS WORSE WHEN USING AS PROPHYLACTIC  . Percocet [Oxycodone-Acetaminophen] Rash  . Tape Itching and Rash    Social History:   Social History   Socioeconomic History  . Marital status: Single    Spouse name: Not on file  . Number of children: 2  . Years of education: Masters  . Highest education level: Not on file  Occupational History  . Occupation: Landscape architect  Social Needs  . Financial resource strain: Somewhat hard  . Food insecurity:    Worry: Sometimes true    Inability: Sometimes true  . Transportation needs:    Medical: No    Non-medical: No  Tobacco Use  . Smoking status: Former Smoker    Packs/day: 0.00    Years: 41.00    Pack years: 0.00    Types: Cigarettes    Last attempt to quit: 03/05/2015    Years since quitting: 3.1  . Smokeless tobacco: Never Used  . Tobacco comment: 04/29/2015 "quit smoking cigarettes 02/27/2015"  Substance and Sexual Activity  . Alcohol use: No  . Drug use: No  . Sexual activity: Not Currently    Birth control/protection: Post-menopausal, Surgical  Lifestyle  . Physical activity:    Days per week: 0 days    Minutes per session: 0 min  . Stress: Very much  Relationships  . Social connections:    Talks on phone: More than three times a week    Gets together: More than three times a week    Attends religious service: More than 4 times per year    Active member of club or organization: No    Attends meetings of clubs or organizations: Never    Relationship status: Never married  . Intimate partner violence:    Fear of current or ex  partner: No    Emotionally abused: No    Physically abused: No    Forced sexual activity: No  Other Topics Concern  . 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      Lives with sister in [place].      Tobacco: 1 ppd x 41 years - quit 2016      Alcohol: none      Drugs: none      Exercise:  Walking in neighborhood; physical job.   Right-handed.   2 cups caffeine daily.    Family History:   Family History  Problem Relation Age of Onset  . Cancer Mother 42       bronchial cancer  . Breast cancer Mother   . Lung cancer Mother   . Hypertension Father   . COPD Father   . Heart disease Father 4       CAD with cardiac stenting  . Heart attack Father   . Parkinson's disease Father   . Allergies Sister   . Breast cancer Maternal Grandmother   . Emphysema Maternal Grandfather   . Leukemia Paternal Grandmother   . Emphysema Paternal Grandfather   . Thyroid disease Neg Hx      ROS:  Please see the history of present illness.  All other ROS reviewed and negative.     Physical Exam/Data:   Vitals:   05/15/18 0600 05/15/18 0700 05/15/18 0800 05/15/18 0817  BP: 130/62 140/75 (!) 128/102 113/75  Pulse: 65 65 82 75  Resp: _0 Temp:      TempSrc:      SpO2: 100% 98% 100% 100%    Intake/Output Summary (Last 24 hours) at 05/15/2018 0848 Last data filed at 05/15/2018 6712 Gross per 24 hour  Intake 1311.94 ml  Output 300 ml  Net 1011.94 ml   There were no vitals filed for this visit. There is no height or weight on file to calculate BMI.  General:  Well nourished, well developed, in no acute distress, appears chronically ill and older then her age 51: normal Lymph: no adenopathy Neck: no JVD Endocrine:  No thryomegaly Vascular: No carotid bruits Cardiac:  RRR; 1/6 SM, no gallops or rubs Lungs:  CTA b/l, no wheezing, rhonchi or rales  Abd: soft, nontender, no hepatomegaly  Ext: no edema Musculoskeletal:  R BKA, incision/staples look clean Skin: warm and dry  Neuro:  Could not recall year, otherwise no gross focal abnormalities noted Psych:  Normal affect, very pleasant   EKG:  The EKG was  personally reviewed and demonstrates:    #1 AFib, 146, nonsustained wide complex beats (irregular and monomorphic), QT is difficult to establish #2 SR 76bpm, measured QT 47m, QTc 4757m no ischemic changes #3 AFlutter (atypical), 140bpm, few wide complex beats, couplet and single #4 is SR 71bpm  Telemetry:  Telemetry was personally reviewed and demonstrates:   Currently SR 70's  Relevant CV Studies:  01/03/18: TTE Study Conclusions - Procedure narrative: Transthoracic echocardiography. Image   quality was adequate. The study was technically difficult. - Left ventricle: The cavity size was normal. Wall thickness was   increased in a pattern of mild LVH. Systolic function was normal.   The estimated ejection fraction  was in the range of 55% to 60%.   Possible mild anterolateral hypokinesis. Features are consistent   with a pseudonormal left ventricular filling pattern, with   concomitant abnormal relaxation and increased filling pressure   (grade 2 diastolic dysfunction). - Aortic valve: Trileaflet; moderately calcified leaflets. There   was moderate stenosis. Mean gradient (S): 19 mm Hg. Peak gradient   (S): 30 mm Hg. Valve area (VTI): 1.16 cm^2. - Mitral valve: Moderately calcified annulus. There was mild   regurgitation. - Left atrium: The atrium was mildly dilated. - Right ventricle: The cavity size was normal. Systolic function   was normal. - Pulmonary arteries: No complete TR doppler jet so unable to   estimate PA systolic pressure. - Systemic veins: IVC measured 2.6 cm with < 50% respirophasic   variation, suggesting RA pressure 15 mmHg. - Pericardium, extracardiac: A trivial pericardial effusion was   identified. Impressions - Normal LV size with mild LV hypertrophy. EF 55-60% with possible   mild anterolateral hypokinesis. Moderate diastolic dysfunction.   Normal RV size and systolic function. Mild MR. Moderate aortic   stenosis. Dilated IVC suggestive of elevated RV  filling pressure.   03/06/15: LHC/PCI Left Anterior Descending  Prox LAD to Mid LAD lesion 40% stenosed  calcified diffuse.  Left Circumflex  Mid Cx lesion 80% stenosed  calcified.  Second Obtuse Marginal Branch  Ost 2nd Mrg to 2nd Mrg lesion 70% stenosed  calcified diffuse.  Right Coronary Artery  Collaterals  Dist RCA filled by collaterals from Dist LAD.    Prox RCA lesion 100% stenosed  calcified diffuse.  Intervention   Mid Cx lesion  Angioplasty  Pre-stent angioplasty was performed. A drug-eluting stent was placed. A post-stent angioplasty was not performed. Maximum pressure: 16 atm. The pre-interventional distal flow is normal (TIMI 3). The post-interventional distal flow is normal (TIMI 3). The intervention was successful. No complications occured at this lesion. IC nitroglycerin was given. The mid circumflex/obtuse marginal bifurcation is treated. There is complex bifurcation stenosis present. I planned on using a bifurcation stenting technique. Angiomax is used for anticoagulation. The patient is adequately preloaded with aspirin and Plavix. A 7 French XB 3.5 cm guide catheter was utilized. Once a therapeutic ACT was achieved, a cougar guidewire is advanced into the OM. A second cougar guidewire is advanced into the distal circumflex. The obtuse marginal branches predilated with a 2.5 x 20 mm noncompliant balloon. The circumflex is then predilated with a 3.0 mm balloon. With the balloon present in the circumflex, a 2.5 x 20 mm Promus DES his advanced into the OM branch. The stent is deployed so that the ostium is covered. Simultaneously, the circumflex balloon is dilated in order to keep the circumflex portion patent. The circumflex stent is then advanced so that it lays across the OM origin. A 3.5 x 12 mm Promus DES was chosen. This stent is deployed at 14 atm. I tried to rewire the OM branch but was unsuccessful. I decided to post dilate the circumflex stent in order to open the  cells better. A 4.0 mm noncompliant balloon is chosen. It is dilated to 16 atm. I was then able to wire the obtuse marginal branch with a whisper wire. The OM stent is postdilated with a 2.5 mm noncompliant balloon. A final kissing balloon is done with the 4.0 mm balloon and the circumflex and the 2.5 mm balloon in the obtuse marginal. At the completion of the procedure there is 0% residual stenosis in the circumflex  and 20% residual stenosis at the obtuse marginal origin. There is TIMI-3 flow into both vessels.  There is a 0% residual stenosis post intervention.  Ost 2nd Mrg to 2nd Mrg lesion  Angioplasty  Pre-stent angioplasty was performed. A drug-eluting stent was placed. A post-stent angioplasty was not performed. The pre-interventional distal flow is normal (TIMI 3). The post-interventional distal flow is normal (TIMI 3). The intervention was successful. No complications occured at this lesion. IC nitroglycerin was given. See description under LCx lesion, bifurcation stenting technique utilized  There is a 20% residual stenosis post intervention.     Laboratory Data:  Chemistry Recent Labs  Lab 05/14/18 1052 05/14/18 2046  NA 133* 135  K 3.4* 4.1  CL 97* 101  CO2 13* 13*  GLUCOSE 337* 242*  BUN 24* 24*  CREATININE 1.39* 1.13*  CALCIUM 7.8* 7.6*  GFRNONAA 40* 51*  GFRAA 46* 59*  ANIONGAP 23* 21*    Recent Labs  Lab 05/14/18 1052  PROT 8.6*  ALBUMIN 3.6  AST 32  ALT 14  ALKPHOS 57  BILITOT 0.6   Hematology Recent Labs  Lab 05/14/18 1052 05/15/18 0215  WBC 12.6* 13.5*  RBC 5.16* 4.45  HGB 13.9 12.5  HCT 45.5 38.0  MCV 88.2 85.4  MCH 26.9 28.1  MCHC 30.5 32.9  RDW 13.5 13.9  PLT 624* 474*   Cardiac Enzymes Recent Labs  Lab 05/14/18 1446 05/14/18 2046 05/15/18 0215  TROPONINI 0.07* 0.20* 0.68*    Recent Labs  Lab 05/14/18 1050  TROPIPOC 0.02    BNPNo results for input(s): BNP, PROBNP in the last 168 hours.  DDimer No results for input(s): DDIMER in  the last 168 hours.  Radiology/Studies:   Ct Head Wo Contrast Result Date: 05/14/2018 CLINICAL DATA:  Altered mental status, new onset of seizures, history of chronic kidney disease, coronary artery disease, diabetes mellitus, hypertension EXAM: CT HEAD WITHOUT CONTRAST TECHNIQUE: Contiguous axial images were obtained from the base of the skull through the vertex without intravenous contrast. Sagittal and coronal MPR images reconstructed from axial data set. COMPARISON:  05/23/2015 FINDINGS: Brain: Motion artifacts on initial imaging, for which repeat imaging was performed. Generalized atrophy. Normal ventricular morphology. No midline shift or mass effect. Small vessel chronic ischemic changes of deep cerebral white matter. Dystrophic calcification identified at the posterior inferior aspect of the LEFT thalamus, unchanged. No intracranial hemorrhage, mass lesion, or evidence of acute infarction. No extra-axial fluid collections. Enlarged empty sella. Vascular: Atherosclerotic calcifications of internal carotid arteries at skull base. No hyperdense vessels. Skull: Intact Sinuses/Orbits: Clear Other: N/A IMPRESSION: Atrophy with small vessel chronic ischemic changes of deep cerebral white matter. Chronic dystrophic calcification at the dorsal inferior LEFT thalamus, question sequela of prior hemorrhage. No acute intracranial abnormalities. Enlarged empty sella. Electronically Signed   By: Lavonia Dana M.D.   On: 05/14/2018 17:40    Dg Chest Port 1 View Result Date: 05/14/2018 CLINICAL DATA:  Onset shortness of breath and nausea this morning. EXAM: PORTABLE CHEST 1 VIEW COMPARISON:  Single-view of the chest 04/16/2018. PA and lateral chest 06/04/2017. FINDINGS: Defibrillator pad is in place. The lungs are clear. Heart size is normal. No pneumothorax or pleural fluid. No acute or focal bony abnormality. IMPRESSION: No acute disease. Electronically Signed   By: Inge Rise M.D.   On: 05/14/2018 12:34     Assessment and Plan:   1. Seizure vs syncope 2. WCT     This only occurs when in AFib/flutter, often irregular  I suspect this is BBB, though not completely convinced is not VT either given ?syncope?seizure      Agree with correction of electrolytes, keep Mag >2.0 and K+ better then 4.0      Continue amiodarone gtt  She reports has not been  Taking any of her medicines for about 2 days PTA 2/2 nausea Doubt sotalol played any role here  Check TTE, any change in EF or new WMA  3. Persistent AFib, flutter     CHA2DS2Vasc is 5, on Eliquis out patient, held on admission     I am not certain of any need for cardiac procedures     She is in SR currently       4. CAD     Mild abnormal Trop, no ischemic EKG changes appreciated, likely demand     She has not had any cardiac awareness of late, no CP of any kind     No active complaints of any cardiac sounding etiology       5. Recent Right BKA     Elevated WBC, and lactic acid  6. C/o nausea as primary symptom (x3 days)    Will review with Dr. Lovena Le, he will see the patient later this morning Continue amiodarone gtt Recheck mag level Check new echo      For questions or updates, please contact [place] Please consult [email or web address] for contact info under     Signed, Baldwin Jamaica, PA-C  05/15/2018 8:48 AM  EP attending  Patient seen and examined.  Agree with the findings as noted above.  The patient is a obese woman with a history of coronary artery disease as well as peripheral vascular disease status post multiple percutaneous interventions and a chronic totally occluded right coronary artery.  She has a history of recent right below the knee amputation.  She has had problems with atrial fibrillation as well.  She presented with atrial fibrillation and a rapid ventricular response with a narrow QRS but associated wide QRS complexes worrisome for ventricular tachycardia.  She did not have syncope.  She had  minimal palpitations.  She has been treated with IV amiodarone and her atrial fibrillation has improved and with that her wide QRS tachycardia has resolved.  This was certainly lead I to think that the patient was having aberrantly conducted atrial fibrillation with a rapid ventricular response.  She has been treated with with additional medication.  She denies chest pain.  When she goes out of rhythm and goes fast, she does experience chest pressure.  She has minimal palpitations.  Her exam demonstrates a chronically ill-appearing 68 year old woman who looks much older than her stated age.  She is status post right BKA.  Her lungs were clear bilaterally except for scattered rales and a cardiovascular exam revealed regular rate and rhythm with normal S1 and S2.  Abdomen was soft and nontender.  Neuro was nonfocal.  Telemetry demonstrated atrial fibrillation with a rapid ventricular response now sinus rhythm with a normal ventricular response.  Chest x-ray was reviewed.    Assessment and plan 1.  Paroxysmal atrial fibrillation -she is maintaining sinus rhythm on amiodarone.  Continue.  Long-term she is not a great candidate for amiodarone but I think in light of her multiple comorbidities and physiologic age which is much older than her chronologic age, stopping sotalol and continued amiodarone is the most reasonable approach at this time. 2.  Wide QRS tachycardia -while I cannot definitively rule out an irregular wide  QRS tachycardia, the morphology is monomorphic, and the irregularity and the association with her atrial fibrillation very strongly represents a diagnosis of aberrantly conducted atrial fibrillation.  I would recommend watchful waiting and continuation of amiodarone. 3.  Peripheral vascular disease -she is status post BKA.  She denied stump pain or phantom pain.  Cristopher Peru, MD

## 2018-05-15 NOTE — Progress Notes (Signed)
CRITICAL VALUE ALERT  Critical Value:  Repeat lactic acid 3.1  Date & Time Notied: 05/15/2018 2356  Provider Notified: Lovey Newcomer, NP  Orders Received/Actions taken: No orders at this time.

## 2018-05-15 NOTE — Progress Notes (Signed)
CRITICAL VALUE ALERT  Critical Value:  Lactic Acid 2.6  Date & Time Notied: 05/14/2018, 2059  Provider Notified:Xenia Kennon Holter, NP.   Orders Received/Actions taken: Orders for NS bolus. See MAR.

## 2018-05-16 ENCOUNTER — Inpatient Hospital Stay (HOSPITAL_COMMUNITY): Payer: Medicare PPO

## 2018-05-16 DIAGNOSIS — I472 Ventricular tachycardia: Secondary | ICD-10-CM

## 2018-05-16 LAB — BASIC METABOLIC PANEL
Anion gap: 12 (ref 5–15)
BUN: 24 mg/dL — ABNORMAL HIGH (ref 8–23)
CO2: 21 mmol/L — ABNORMAL LOW (ref 22–32)
Calcium: 8.3 mg/dL — ABNORMAL LOW (ref 8.9–10.3)
Chloride: 102 mmol/L (ref 98–111)
Creatinine, Ser: 1.39 mg/dL — ABNORMAL HIGH (ref 0.44–1.00)
GFR calc Af Amer: 46 mL/min — ABNORMAL LOW (ref >60)
GFR calc non Af Amer: 40 mL/min — ABNORMAL LOW (ref >60)
Glucose, Bld: 159 mg/dL — ABNORMAL HIGH (ref 70–99)
Potassium: 3 mmol/L — ABNORMAL LOW (ref 3.5–5.1)
SODIUM: 135 mmol/L (ref 135–145)

## 2018-05-16 LAB — GLUCOSE, CAPILLARY
Glucose-Capillary: 127 mg/dL — ABNORMAL HIGH (ref 70–99)
Glucose-Capillary: 157 mg/dL — ABNORMAL HIGH (ref 70–99)
Glucose-Capillary: 172 mg/dL — ABNORMAL HIGH (ref 70–99)
Glucose-Capillary: 174 mg/dL — ABNORMAL HIGH (ref 70–99)
Glucose-Capillary: 195 mg/dL — ABNORMAL HIGH (ref 70–99)
Glucose-Capillary: 276 mg/dL — ABNORMAL HIGH (ref 70–99)
Glucose-Capillary: 388 mg/dL — ABNORMAL HIGH (ref 70–99)

## 2018-05-16 LAB — ECHOCARDIOGRAM COMPLETE

## 2018-05-16 LAB — MAGNESIUM: Magnesium: 2.3 mg/dL (ref 1.7–2.4)

## 2018-05-16 MED ORDER — APIXABAN 5 MG PO TABS
5.0000 mg | ORAL_TABLET | Freq: Two times a day (BID) | ORAL | Status: DC
  Administered 2018-05-16 – 2018-05-23 (×15): 5 mg via ORAL
  Filled 2018-05-16 (×15): qty 1

## 2018-05-16 MED ORDER — POTASSIUM CHLORIDE CRYS ER 20 MEQ PO TBCR
40.0000 meq | EXTENDED_RELEASE_TABLET | Freq: Once | ORAL | Status: AC
  Administered 2018-05-16: 40 meq via ORAL
  Filled 2018-05-16: qty 2

## 2018-05-16 MED ORDER — AMIODARONE HCL 200 MG PO TABS
400.0000 mg | ORAL_TABLET | Freq: Every day | ORAL | Status: DC
  Administered 2018-05-16: 400 mg via ORAL
  Filled 2018-05-16: qty 2

## 2018-05-16 NOTE — Progress Notes (Signed)
PROGRESS NOTE  Judith [name]  HGD:[id number] DOB: [birth year] DOA: 05/14/2018 PCP: Rutherford Guys, MD  Brief Narrative: Judith [name] is a 68 y.o. female with a history of AFib, VT, stage III CKD, recent right BKA, and T2DM who presented to the ED with an unresponsive episode. She is living with her sister since undergoing right BKA recently and has been failing to thrive generally, eating less, getting up less, but on 1/5 noted palpitations typical of previous episodes of AFib with RVR. Her sister drove her to the ED when en route the patient had 2-3 minutes of tensing bilateral arms, being unresponsive with eyes rolled in the back of her head, maintaining postural tone in the seat, then began moving all limbs, bumping the BKA stump against the dashboard which would normally cause excruciating pain though didn't seem to cause pain. On arrival she was found to have a wide complex tachycardia, given amiodarone bolus and metoprolol with conversion to narrow AFib with RVR. Mental status had improved when the patient had another episode lasting about 3 minutes terminated with ativan 2mg  IV, postictal-appearing thereafter. Mental status has waxed and waned consistent with delirium, but has largely returned to baseline. Nausea has resolved and she's eating a normal diet, has had no further seizure-like activity. Heart rhythm converted to NSR overnight into 05/15/2018.   Assessment & Plan: Principal Problem:   V-tach [facility]) Active Problems:   DM (diabetes mellitus), type 2 with peripheral vascular complications (HCC)   HTN (hypertension)   Memory loss   Atrial fibrillation with RVR (HCC)   Chronic combined systolic and diastolic congestive heart failure (HCC)   CKD (chronic kidney disease), stage III (HCC)   Seizure (HCC)  Acute metabolic encephalopathy: Due to postictal state, possibly related to arrhythmia, also likely an element of acute delirium as she's struggled with that in the hospital in the past.  Also has had subacute decline in mood, appetite, etc. following BKA, so suspect depression also contributing on longer time scale. No definite infectious cause identified. Stump site appears well healing, urinalysis pending. - Delirium precautions, sitter prn especially qHS. - Will hold sedating medications as able. Do not want to precipitate gabapentin withdrawal, so restarted this at previous dose.  Wide complex tachycardia, possibly ventricular tachycardia, persistent AFib with RVR:  - Appreciate cardiology/EP assistance. Continuing amiodarone gtt for now. Has converted to NSR around midnight between 1/5 - 1/6. CHA2DS2-VASc score is 5, will restart eliquis since ok with cardiology. Not planning to restart sotalol. - Echocardiogram with EF 60-65%, G1DD, no regional WMA. mild-mod AS.  - Continue closely following K and Mg, giving potassium this morning, Mg at goal >2.   Troponin elevation: Suspect demand ischemia in setting of arrhythmia, also suspected to be etiology of lactic acid elevation. Lactate has cleared, troponin still with elevation as expected with CAD and significant demand. No intervention planned per cardiology.  New onset seizures/seizure-like activity: No intracerebral abnormalities on imaging, EEG the following day is interpreted as a normal EEG in sleeping and awake states.  - Stopped tramadol.  - Since it is first episode and in setting of dehydration, arrhythmia, and EEG normal will not start AED at this time.  - prn ativan  Nausea: Waxing/waning PTA. Has been evaluated by GI as outpatient.  - Antiemetics prn, tolerating diet, advance as tolerated.  Osteoarthritis of left shoulder, chronic pain, s/p right BKA: Normal wound healing. - Not many options for pain management with allergies to codeine, dilaudid, oxycodone, as well  as CKD precluding NSAIDs and seizures precluding tramadol. Seems to be improved on hydrocodone prn with good tolerance. - Orthopedic follow up as  scheduled.   IDT2DM: Last HBA1c 8% - Continue long-acting insulin 10 from 20u qHS and cover with SSI. Has been at inpatient goal today.  Stage III CKD:  - Monitor, avoid nephrotoxins.   HTN:  - Continue norvasc.   OSA:  - Continue CPAP qHS  Hyperthyroidism:  - Continue tapazole. TSH wnl.  Depression with adjustment disorder:  - Continue home medications - Needs outpatient therapy  DVT prophylaxis: SCDs Code Status: Full Family Communication: Noneat bedside Disposition Plan: Stable for transfer to telemetry floor. PT/OT evaluations requested. From home with sister PTA. Will need clearance by cardiology prior to discharge, but possibly in next 24-48 hours.  Consultants:   Cardiology/EP  Procedures:   Echocardiogram 05/16/2018: - Left ventricle: The cavity size was normal. Wall thickness was   increased in a pattern of severe LVH. Systolic function was   normal. The estimated ejection fraction was in the range of 60%   to 65%. Wall motion was normal; there were no regional wall   motion abnormalities. There was an increased relative   contribution of atrial contraction to ventricular filling.   Doppler parameters are consistent with abnormal left ventricular   relaxation (grade 1 diastolic dysfunction). Doppler parameters   are consistent with high ventricular filling pressure. - Aortic valve: Valve mobility was restricted. There was mild to   moderate stenosis. Mean gradient (S): 17 mm Hg. VTI ratio of LVOT   to aortic valve: 0.36. Valve area (VTI): 1.37 cm^2. Valve area   (Vmax): 1.22 cm^2. Valve area (Vmean): 1.27 cm^2. - Mitral valve: Calcified annulus. Valve area by pressure   half-time: 2.39 cm^2. - Left atrium: The atrium was moderately dilated. - Pulmonary arteries: Systolic pressure could not be accurately   estimated.  Antimicrobials:  None   Subjective: No complaints this afternoon. Has remained in sinus rhythm without palpitations, chest pain, dyspnea.  Eating a lot for lunch because nausea has improved significant. Shoulder pain is stable. Right stump pain is minimal. Had agitation overnight requiring sitter.  Objective: Vitals:   05/16/18 1000 05/16/18 1100 05/16/18 1200 05/16/18 1215  BP: (!) 146/63 127/76 (!) 115/96   Pulse: 69 71 75   Resp: 16 14 13    Temp:    97.9 F (36.6 C)  TempSrc:    Axillary  SpO2: 99% 98% 100%     Intake/Output Summary (Last 24 hours) at 05/16/2018 1301 Last data filed at 05/16/2018 1233 Gross per 24 hour  Intake 1397.4 ml  Output 550 ml  Net 847.4 ml   Gen: 68 y.o. female in no distress Pulm: Nonlabored breathing room air. Clear. CV: Regular rate and rhythm. No murmur, rub, or gallop. No JVD, no dependent edema. GI: Abdomen soft, non-tender, non-distended, with normoactive bowel sounds.  Ext: Warm, right BKA with staples in place, no significant tenderness. Skin: No new rashes, lesions or ulcers on visualized skin.  Neuro: Alert, conversant but not completely oriented. No focal neurological deficits. Psych: Judgement and insight appear impaired. Mood euthymic & affect congruent. Behavior is appropriate.    Data Reviewed: I have personally reviewed following labs and imaging studies  CBC: Recent Labs  Lab 05/14/18 1052 05/15/18 0215  WBC 12.6* 13.5*  NEUTROABS 8.3*  --   HGB 13.9 12.5  HCT 45.5 38.0  MCV 88.2 85.4  PLT 624* 517*   Basic Metabolic Panel: Recent  Labs  Lab 05/14/18 1052 05/14/18 1909 05/14/18 2046 05/14/18 2257 05/15/18 0215 05/15/18 1103 05/16/18 0346  NA 133*  --  135  --   --   --  135  K 3.4*  --  4.1  --  3.6  --  3.0*  CL 97*  --  101  --   --   --  102  CO2 13*  --  13*  --   --   --  21*  GLUCOSE 337*  --  242*  --   --   --  159*  BUN 24*  --  24*  --   --   --  24*  CREATININE 1.39*  --  1.13*  --   --   --  1.39*  CALCIUM 7.8*  --  7.6*  --   --   --  8.3*  MG  --  1.5*  --  1.5* 2.0 1.9 2.3   GFR: CrCl cannot be calculated (Unknown ideal  weight.). Liver Function Tests: Recent Labs  Lab 05/14/18 1052  AST 32  ALT 14  ALKPHOS 57  BILITOT 0.6  PROT 8.6*  ALBUMIN 3.6   Recent Labs  Lab 05/14/18 1052  LIPASE 35   No results for input(s): AMMONIA in the last 168 hours. Coagulation Profile: No results for input(s): INR, PROTIME in the last 168 hours. Cardiac Enzymes: Recent Labs  Lab 05/14/18 1446 05/14/18 2046 05/15/18 0215 05/15/18 1755  TROPONINI 0.07* 0.20* 0.68* 1.16*   BNP (last 3 results) No results for input(s): PROBNP in the last 8760 hours. HbA1C: No results for input(s): HGBA1C in the last 72 hours. CBG: Recent Labs  Lab 05/15/18 1947 05/16/18 0011 05/16/18 0419 05/16/18 0809 05/16/18 1203  GLUCAP 176* 174* 157* 127* 172*   Lipid Profile: No results for input(s): CHOL, HDL, LDLCALC, TRIG, CHOLHDL, LDLDIRECT in the last 72 hours. Thyroid Function Tests: Recent Labs    05/15/18 0215  TSH 1.013   Anemia Panel: No results for input(s): VITAMINB12, FOLATE, FERRITIN, TIBC, IRON, RETICCTPCT in the last 72 hours. Urine analysis:    Component Value Date/Time   COLORURINE YELLOW 05/14/2018 1119   APPEARANCEUR CLOUDY (A) 05/14/2018 1119   LABSPEC 1.017 05/14/2018 1119   PHURINE 5.0 05/14/2018 1119   GLUCOSEU >=500 (A) 05/14/2018 1119   HGBUR SMALL (A) 05/14/2018 1119   BILIRUBINUR NEGATIVE 05/14/2018 1119   BILIRUBINUR negative 07/22/2017 0858   BILIRUBINUR Small 12/06/2014 1441   KETONESUR NEGATIVE 05/14/2018 1119   PROTEINUR 100 (A) 05/14/2018 1119   UROBILINOGEN 0.2 07/22/2017 0858   UROBILINOGEN 1.0 02/12/2015 1238   NITRITE NEGATIVE 05/14/2018 1119   LEUKOCYTESUR LARGE (A) 05/14/2018 1119   No results found for this or any previous visit (from the past 240 hour(s)).    Radiology Studies: Ct Head Wo Contrast  Result Date: 05/14/2018 CLINICAL DATA:  Altered mental status, new onset of seizures, history of chronic kidney disease, coronary artery disease, diabetes mellitus,  hypertension EXAM: CT HEAD WITHOUT CONTRAST TECHNIQUE: Contiguous axial images were obtained from the base of the skull through the vertex without intravenous contrast. Sagittal and coronal MPR images reconstructed from axial data set. COMPARISON:  05/23/2015 FINDINGS: Brain: Motion artifacts on initial imaging, for which repeat imaging was performed. Generalized atrophy. Normal ventricular morphology. No midline shift or mass effect. Small vessel chronic ischemic changes of deep cerebral white matter. Dystrophic calcification identified at the posterior inferior aspect of the LEFT thalamus, unchanged. No intracranial hemorrhage, mass lesion,  or evidence of acute infarction. No extra-axial fluid collections. Enlarged empty sella. Vascular: Atherosclerotic calcifications of internal carotid arteries at skull base. No hyperdense vessels. Skull: Intact Sinuses/Orbits: Clear Other: N/A IMPRESSION: Atrophy with small vessel chronic ischemic changes of deep cerebral white matter. Chronic dystrophic calcification at the dorsal inferior LEFT thalamus, question sequela of prior hemorrhage. No acute intracranial abnormalities. Enlarged empty sella. Electronically Signed   By: Lavonia Dana M.D.   On: 05/14/2018 17:40    Scheduled Meds: . amiodarone  400 mg Oral Daily  . amLODipine  7.5 mg Oral Daily  . atorvastatin  20 mg Oral q1800  . buPROPion  150 mg Oral BID  . FLUoxetine  20 mg Oral QHS  . gabapentin  900 mg Oral BID  . insulin glargine  10 Units Subcutaneous QHS  . insulin lispro  2-6 Units Subcutaneous Q4H  . methimazole  10 mg Oral Daily  . pantoprazole  40 mg Oral BID  . sodium chloride flush  3 mL Intravenous Q12H   Continuous Infusions: . sodium chloride Stopped (05/15/18 1725)     LOS: 2 days   Time spent: 35 minutes.  Patrecia Pour, MD [facility] [email or web address] Password [email or web address] 05/16/2018, 1:01 PM

## 2018-05-16 NOTE — Progress Notes (Addendum)
Progress Note  Patient Name: Judith [name] Date of Encounter: 05/16/2018  Primary Cardiologist: Fransico Him, MD   Subjective   No complaints, sleeping, wakes easily, no CP, palpitations or SOB, she has a sitter in the room, currently does not appear altered  Inpatient Medications    Scheduled Meds: . amLODipine  7.5 mg Oral Daily  . atorvastatin  20 mg Oral q1800  . buPROPion  150 mg Oral BID  . FLUoxetine  20 mg Oral QHS  . gabapentin  900 mg Oral BID  . insulin glargine  10 Units Subcutaneous QHS  . insulin lispro  2-6 Units Subcutaneous Q4H  . methimazole  10 mg Oral Daily  . pantoprazole  40 mg Oral BID  . sodium chloride flush  3 mL Intravenous Q12H   Continuous Infusions: . sodium chloride Stopped (05/15/18 1725)  . amiodarone 30 mg/hr (05/16/18 0900)   PRN Meds: sodium chloride, HYDROcodone-acetaminophen, levalbuterol, ondansetron **OR** ondansetron (ZOFRAN) IV, sodium chloride flush   Vital Signs    Vitals:   05/16/18 0700 05/16/18 0800 05/16/18 0900 05/16/18 1000  BP: 138/60 131/70 122/72 (!) 146/63  Pulse: 66 80  69  Resp: '19 18  16  ' Temp:  98.2 F (36.8 C)    TempSrc:  Oral    SpO2: 100% 100%  99%    Intake/Output Summary (Last 24 hours) at 05/16/2018 1039 Last data filed at 05/16/2018 0900 Gross per 24 hour  Intake 1107.22 ml  Output 550 ml  Net 557.22 ml   There were no vitals filed for this visit.  Telemetry    SR, occasional PACs, no AF, no WCT - Personally Reviewed  ECG    No  New EKGs - Personally Reviewed  Physical Exam   GEN: No acute distress, looks older then her age, chronically ill in appearance.   Neck: No JVD Cardiac: RRR, no murmurs, rubs, or gallops.  Respiratory: CTA b/l. GI: Soft, nontender, non-distended  MS: No edema LLE s/p R BKA. Neuro:  Nonfocal  Psych: Normal affect   Labs    Chemistry Recent Labs  Lab 05/14/18 1052 05/14/18 2046 05/15/18 0215 05/16/18 0346  NA 133* 135  --  135  K 3.4* 4.1 3.6 3.0*    CL 97* 101  --  102  CO2 13* 13*  --  21*  GLUCOSE 337* 242*  --  159*  BUN 24* 24*  --  24*  CREATININE 1.39* 1.13*  --  1.39*  CALCIUM 7.8* 7.6*  --  8.3*  PROT 8.6*  --   --   --   ALBUMIN 3.6  --   --   --   AST 32  --   --   --   ALT 14  --   --   --   ALKPHOS 57  --   --   --   BILITOT 0.6  --   --   --   GFRNONAA 40* 51*  --  40*  GFRAA 46* 59*  --  46*  ANIONGAP 23* 21*  --  12     Hematology Recent Labs  Lab 05/14/18 1052 05/15/18 0215  WBC 12.6* 13.5*  RBC 5.16* 4.45  HGB 13.9 12.5  HCT 45.5 38.0  MCV 88.2 85.4  MCH 26.9 28.1  MCHC 30.5 32.9  RDW 13.5 13.9  PLT 624* 474*    Cardiac Enzymes Recent Labs  Lab 05/14/18 1446 05/14/18 2046 05/15/18 0215 05/15/18 1755  TROPONINI 0.07* 0.20*  0.68* 1.16*    Recent Labs  Lab 05/14/18 1050  TROPIPOC 0.02     BNPNo results for input(s): BNP, PROBNP in the last 168 hours.   DDimer No results for input(s): DDIMER in the last 168 hours.   Radiology      Cardiac Studies   Echo is completed, pending read  01/03/18: TTE Study Conclusions - Procedure narrative: Transthoracic echocardiography. Image quality was adequate. The study was technically difficult. - Left ventricle: The cavity size was normal. Wall thickness was increased in a pattern of mild LVH. Systolic function was normal. The estimated ejection fraction was in the range of 55% to 60%. Possible mild anterolateral hypokinesis. Features are consistent with a pseudonormal left ventricular filling pattern, with concomitant abnormal relaxation and increased filling pressure (grade 2 diastolic dysfunction). - Aortic valve: Trileaflet; moderately calcified leaflets. There was moderate stenosis. Mean gradient (S): 19 mm Hg. Peak gradient (S): 30 mm Hg. Valve area (VTI): 1.16 cm^2. - Mitral valve: Moderately calcified annulus. There was mild regurgitation. - Left atrium: The atrium was mildly dilated. - Right ventricle: The  cavity size was normal. Systolic function was normal. - Pulmonary arteries: No complete TR doppler jet so unable to estimate PA systolic pressure. - Systemic veins: IVC measured 2.6 cm with < 50% respirophasic variation, suggesting RA pressure 15 mmHg. - Pericardium, extracardiac: A trivial pericardial effusion was identified. Impressions - Normal LV size with mild LV hypertrophy. EF 55-60% with possible mild anterolateral hypokinesis. Moderate diastolic dysfunction. Normal RV size and systolic function. Mild MR. Moderate aortic stenosis. Dilated IVC suggestive of elevated RV filling pressure.   03/06/15: LHC/PCI Left Anterior Descending  Prox LAD to Mid LAD lesion 40% stenosed  calcified diffuse.  Left Circumflex  Mid Cx lesion 80% stenosed  calcified.  Second Obtuse Marginal Branch  Ost 2nd Mrg to 2nd Mrg lesion 70% stenosed  calcified diffuse.  Right Coronary Artery  Collaterals  Dist RCA filled by collaterals from Dist LAD.    Prox RCA lesion 100% stenosed  calcified diffuse.  Intervention   Mid Cx lesion  Angioplasty  Pre-stent angioplasty was performed. A drug-eluting stent was placed. A post-stent angioplasty was not performed. Maximum pressure: 16 atm. The pre-interventional distal flow is normal (TIMI 3). The post-interventional distal flow is normal (TIMI 3). The intervention was successful. No complications occured at this lesion. IC nitroglycerin was given. The mid circumflex/obtuse marginal bifurcation is treated. There is complex bifurcation stenosis present. I planned on using a bifurcation stenting technique. Angiomax is used for anticoagulation. The patient is adequately preloaded with aspirin and Plavix. A 7 French XB 3.5 cm guide catheter was utilized. Once a therapeutic ACT was achieved, a cougar guidewire is advanced into the OM. A second cougar guidewire is advanced into the distal circumflex. The obtuse marginal branches predilated with a  2.5 x 20 mm noncompliant balloon. The circumflex is then predilated with a 3.0 mm balloon. With the balloon present in the circumflex, a 2.5 x 20 mm Promus DES his advanced into the OM branch. The stent is deployed so that the ostium is covered. Simultaneously, the circumflex balloon is dilated in order to keep the circumflex portion patent. The circumflex stent is then advanced so that it lays across the OM origin. A 3.5 x 12 mm Promus DES was chosen. This stent is deployed at 14 atm. I tried to rewire the OM branch but was unsuccessful. I decided to post dilate the circumflex stent in order to open the cells  better. A 4.0 mm noncompliant balloon is chosen. It is dilated to 16 atm. I was then able to wire the obtuse marginal branch with a whisper wire. The OM stent is postdilated with a 2.5 mm noncompliant balloon. A final kissing balloon is done with the 4.0 mm balloon and the circumflex and the 2.5 mm balloon in the obtuse marginal. At the completion of the procedure there is 0% residual stenosis in the circumflex and 20% residual stenosis at the obtuse marginal origin. There is TIMI-3 flow into both vessels.  There is a 0% residual stenosis post intervention.  Ost 2nd Mrg to 2nd Mrg lesion  Angioplasty  Pre-stent angioplasty was performed. A drug-eluting stent was placed. A post-stent angioplasty was not performed. The pre-interventional distal flow is normal (TIMI 3). The post-interventional distal flow is normal (TIMI 3). The intervention was successful. No complications occured at this lesion. IC nitroglycerin was given. See description under LCx lesion, bifurcation stenting technique utilized  There is a 20% residual stenosis post intervention.      Patient Profile     68 y.o. female  with a hx of CAD (PCI w/DES to LCx/OM, known CTO RCA in 2016), HTN, HLD, hyperthyroidism on methimazole, OSA w/BIPAP, DM, PVD is s/p recent [place] BKA (04/15/18), and persistent AFib admitted after a seizure vs  syncopal event, proceeded by 3 days of nausea, poor PO intake, and no meds for 2 days, noted in AFib w/RVR and episodes of WCT.  Assessment & Plan     1. Seizure vs syncope 2. WCT     This only occured when in AFib/flutter, often was irregular (some more regular)      Felt to represent BBB/Ashman's, given only occurred when in AFib, none with SR      Continue correction of electrolytes, keep Mag >2.0 and K+ better then 4.0 (medicine team addressing)      Change Amiodarone gtt to 468m PO daily today      No sotalol going forward   3. Persistent AFib, flutter     CHA2DS2Vasc is 5, on Eliquis out patient     no cardiac procedures planned     OK to resume Eliquis from our perspective        4. CAD     Mild abnormal Trop, no ischemic EKG changes appreciated, likely demand     She has not had any cardiac awareness of late, no CP of any kind     No active complaints of any cardiac sounding etiology       5. Recent Right BKA     Elevated WBC, and lactic acid  6. C/o nausea as primary symptom (x3 days)       For questions or updates, please contact [place]Please consult [email or web address] for contact info under        Signed, RBaldwin Jamaica PA-C  05/16/2018, 10:39 AM    EP Attending  Patient seen and examined. Agree with the findings as noted above. The patient is much improved. She has is maintainind NSR. I have recommended she continue amio and switch to oral amio today.   GMikle BosworthD.

## 2018-05-16 NOTE — Progress Notes (Signed)
Paged MD on call Baltazar Najjar) about patient K+ being 3.0 this AM. Patient otherwise stable. RN waiting on orders. Will continue to monitor.   Neizan Debruhl E Reola Mosher, [place]

## 2018-05-16 NOTE — Telephone Encounter (Signed)
Called and sw Mallory to advise verbal ok for OT orders as below. To call with questions.

## 2018-05-16 NOTE — Progress Notes (Signed)
  Echocardiogram 2D Echocardiogram has been performed.  Judith [name] 05/16/2018, 10:15 AM

## 2018-05-16 NOTE — Progress Notes (Signed)
Pt has home CPAP machine which she wears at night.

## 2018-05-17 LAB — GLUCOSE, CAPILLARY
GLUCOSE-CAPILLARY: 207 mg/dL — AB (ref 70–99)
Glucose-Capillary: 128 mg/dL — ABNORMAL HIGH (ref 70–99)
Glucose-Capillary: 156 mg/dL — ABNORMAL HIGH (ref 70–99)
Glucose-Capillary: 314 mg/dL — ABNORMAL HIGH (ref 70–99)
Glucose-Capillary: 225 mg/dL — ABNORMAL HIGH (ref 70–99)

## 2018-05-17 LAB — BASIC METABOLIC PANEL
ANION GAP: 9 (ref 5–15)
BUN: 23 mg/dL (ref 8–23)
CO2: 22 mmol/L (ref 22–32)
Calcium: 9.6 mg/dL (ref 8.9–10.3)
Chloride: 104 mmol/L (ref 98–111)
Creatinine, Ser: 1.16 mg/dL — ABNORMAL HIGH (ref 0.44–1.00)
GFR calc Af Amer: 57 mL/min — ABNORMAL LOW (ref >60)
GFR calc non Af Amer: 49 mL/min — ABNORMAL LOW (ref >60)
Glucose, Bld: 131 mg/dL — ABNORMAL HIGH (ref 70–99)
Potassium: 3.7 mmol/L (ref 3.5–5.1)
Sodium: 135 mmol/L (ref 135–145)

## 2018-05-17 LAB — MAGNESIUM
Magnesium: 1.8 mg/dL (ref 1.7–2.4)
Magnesium: 2.2 mg/dL (ref 1.7–2.4)

## 2018-05-17 LAB — POTASSIUM: Potassium: 4.8 mmol/L (ref 3.5–5.1)

## 2018-05-17 MED ORDER — AMIODARONE HCL IN DEXTROSE 360-4.14 MG/200ML-% IV SOLN
30.0000 mg/h | INTRAVENOUS | Status: AC
  Administered 2018-05-17 – 2018-05-21 (×8): 30 mg/h via INTRAVENOUS
  Filled 2018-05-17 (×8): qty 200

## 2018-05-17 MED ORDER — AMIODARONE HCL IN DEXTROSE 360-4.14 MG/200ML-% IV SOLN
60.0000 mg/h | INTRAVENOUS | Status: AC
  Administered 2018-05-17 (×2): 60 mg/h via INTRAVENOUS
  Filled 2018-05-17: qty 200

## 2018-05-17 MED ORDER — MAGNESIUM SULFATE 2 GM/50ML IV SOLN
2.0000 g | Freq: Once | INTRAVENOUS | Status: AC
  Administered 2018-05-17: 2 g via INTRAVENOUS
  Filled 2018-05-17: qty 50

## 2018-05-17 MED ORDER — POTASSIUM CHLORIDE CRYS ER 20 MEQ PO TBCR
40.0000 meq | EXTENDED_RELEASE_TABLET | Freq: Once | ORAL | Status: AC
  Administered 2018-05-17: 40 meq via ORAL
  Filled 2018-05-17: qty 2

## 2018-05-17 MED ORDER — AMIODARONE LOAD VIA INFUSION
150.0000 mg | Freq: Once | INTRAVENOUS | Status: AC
  Administered 2018-05-17: 150 mg via INTRAVENOUS
  Filled 2018-05-17: qty 83.34

## 2018-05-17 MED ORDER — SODIUM CHLORIDE 0.9 % IV SOLN
1.0000 g | INTRAVENOUS | Status: DC
  Administered 2018-05-17 – 2018-05-19 (×3): 1 g via INTRAVENOUS
  Filled 2018-05-17 (×5): qty 10

## 2018-05-17 MED ORDER — AMIODARONE HCL IN DEXTROSE 360-4.14 MG/200ML-% IV SOLN
INTRAVENOUS | Status: AC
  Administered 2018-05-17: 150 mg via INTRAVENOUS
  Filled 2018-05-17: qty 200

## 2018-05-17 NOTE — Progress Notes (Signed)
PT Cancellation Note  Patient Details Name: Judith [name] MRN: [id number] DOB: 1951/02/09   Cancelled Treatment:    Reason Eval/Treat Not Completed: Medical issues which prohibited therapy. Tachy in high 150's at rest.  Ellamae Sia, PT, DPT Acute Rehabilitation Services Pager [phone] Office [phone]    Willy Eddy 05/17/2018, 8:37 AM

## 2018-05-17 NOTE — Evaluation (Signed)
Physical Therapy Evaluation Patient Details Name: Judith [name] MRN: [id number] DOB: March 26, 1951 Today's Date: 05/17/2018   History of Present Illness  Pt is a 68 y.o. F with significant PMH of right transtibial amputation, chronic kidney disease, CAD, diabetes mellitus with peripheral neuropathy, paroxysmal atrial fibrillation, who presents after an unresponsive episode. She was found to have wide complex tachycardia and converted to Afib with RVR.  Clinical Impression  Pt admitted with above diagnosis. Pt currently with functional limitations due to the deficits listed below (see PT Problem List). Prior to admission, pt with recent discharge from [place] (12/24) and was living at home with her sister. She was performing squat pivot transfers and mobilizing using a wheelchair. On PT evaluation, patient requiring moderate assistance for bed mobility but able to transfer from bed to chair without physical assistance. HR 122-128 bpm, BP 120/81. Suspect patient will progress well and return to modified independent level using wheelchair and will benefit from [place]. Will follow acutely to progress mobility.      Follow Up Recommendations Home health PT;Supervision/Assistance - 24 hour    Equipment Recommendations  None recommended by PT    Recommendations for Other Services       Precautions / Restrictions Precautions Precautions: Fall Precaution Comments: right BKA Restrictions Weight Bearing Restrictions: Yes RLE Weight Bearing: Non weight bearing      Mobility  Bed Mobility Overal bed mobility: Needs Assistance Bed Mobility: Supine to Sit     Supine to sit: Mod assist     General bed mobility comments: Pt with good initiation and reaching for bed rail, requiring modA for lift assist at trunk  Transfers Overall transfer level: Needs assistance Equipment used: None Transfers: Squat Pivot Transfers     Squat pivot transfers: Min guard     General transfer comment: Min guard  assist for squat pivot transfer towards left. Cues for safety  Ambulation/Gait                Stairs            Wheelchair Mobility    Modified Rankin (Stroke Patients Only)       Balance Overall balance assessment: Needs assistance Sitting-balance support: Feet unsupported Sitting balance-Leahy Scale: Fair                                       Pertinent Vitals/Pain Pain Assessment: Faces Faces Pain Scale: No hurt    Home Living Family/patient expects to be discharged to:: Private residence Living Arrangements: Other relatives Available Help at Discharge: Family;Available PRN/intermittently(sister) Type of Home: House Home Access: Ramped entrance     Home Layout: Laundry or work area in basement;One level Home Equipment: [place] - 2 wheels;Wheelchair - Education administrator (comment);Shower seat;Grab bars - toilet;Grab bars - tub/shower;Hand held shower head      Prior Function Level of Independence: Needs assistance   Gait / Transfers Assistance Needed: Performing squat pivot transfers towards left and mobilizing using wheelchair. On 12/24 discharge from [facility], she was ambulating 5 feet with walker  ADL's / Homemaking Assistance Needed: Pt sister assists with shower, pt requiring set up assist for dressing in supine position, assisted for IADL        Hand Dominance   Dominant Hand: Right    Extremity/Trunk Assessment   Upper Extremity Assessment Upper Extremity Assessment: Defer to OT evaluation    Lower Extremity Assessment Lower Extremity Assessment: RLE  deficits/detail;LLE deficits/detail RLE Deficits / Details: Recent BKA. At least anti gravity strength and able to reach neutral extension LLE Deficits / Details: Generalized weakness       Communication   Communication: No difficulties  Cognition Arousal/Alertness: Awake/alert Behavior During Therapy: WFL for tasks assessed/performed Overall Cognitive Status: Impaired/Different  from baseline Area of Impairment: Memory                     Memory: Decreased short-term memory         General Comments: Very pleasant and eager to participate      General Comments      Exercises     Assessment/Plan    PT Assessment Patient needs continued PT services  PT Problem List Decreased strength;Decreased activity tolerance;Decreased balance;Decreased mobility;Cardiopulmonary status limiting activity       PT Treatment Interventions DME instruction;Gait training;Therapeutic activities;Functional mobility training;Therapeutic exercise;Balance training;Patient/family education;Wheelchair mobility training    PT Goals (Current goals can be found in the Care Plan section)  Acute Rehab PT Goals Patient Stated Goal: "Get stronger." PT Goal Formulation: With patient Time For Goal Achievement: 05/31/18 Potential to Achieve Goals: Good    Frequency Min 3X/week   Barriers to discharge        Co-evaluation PT/OT/SLP Co-Evaluation/Treatment: Yes Reason for Co-Treatment: For patient/therapist safety;To address functional/ADL transfers PT goals addressed during session: Mobility/safety with mobility         AM-PAC PT "6 Clicks" Mobility  Outcome Measure Help needed turning from your back to your side while in a flat bed without using bedrails?: A Little Help needed moving from lying on your back to sitting on the side of a flat bed without using bedrails?: A Lot Help needed moving to and from a bed to a chair (including a wheelchair)?: A Little Help needed standing up from a chair using your arms (e.g., wheelchair or bedside chair)?: A Little Help needed to walk in hospital room?: A Lot Help needed climbing 3-5 steps with a railing? : A Lot 6 Click Score: 15    End of Session Equipment Utilized During Treatment: Gait belt Activity Tolerance: Patient tolerated treatment well Patient left: in chair;with call bell/phone within reach;with chair alarm  set Nurse Communication: Mobility status PT Visit Diagnosis: Other abnormalities of gait and mobility (R26.89);Difficulty in walking, not elsewhere classified (R26.2)    Time: 5790-3833 PT Time Calculation (min) (ACUTE ONLY): 31 min   Charges:   PT Evaluation $PT Eval Moderate Complexity: 1 Mod         Ellamae Sia, [place], DPT Acute Rehabilitation Services Pager [phone] Office [phone]  Willy Eddy 05/17/2018, 2:49 PM

## 2018-05-17 NOTE — Progress Notes (Signed)
PROGRESS NOTE  Judith [name]  LOV:[id number] DOB: November 27, 1950 DOA: 05/14/2018 PCP: Rutherford Guys, MD  Brief Narrative: Judith [name] is a 68 y.o. female with a history of AFib, VT, stage III CKD, recent right BKA, and T2DM who presented to the ED with an unresponsive episode. She is living with her sister since undergoing right BKA recently and has been failing to thrive generally, eating less, getting up less, but on 1/5 noted palpitations typical of previous episodes of AFib with RVR. Her sister drove her to the ED when en route the patient had 2-3 minutes of tensing bilateral arms, being unresponsive with eyes rolled in the back of her head, maintaining postural tone in the seat, then began moving all limbs, bumping the BKA stump against the dashboard which would normally cause excruciating pain though didn't seem to cause pain. On arrival she was found to have a wide complex tachycardia, given amiodarone bolus and metoprolol with conversion to narrow AFib with RVR. Mental status had improved when the patient had another episode lasting about 3 minutes terminated with ativan 2mg  IV, postictal-appearing thereafter. Mental status has waxed and waned consistent with delirium, but has largely returned to baseline. Nausea has resolved and she's eating a normal diet, has had no further seizure-like activity. Heart rhythm converted to NSR overnight into 05/15/2018.   Assessment & Plan: Principal Problem:   V-tach [facility]) Active Problems:   DM (diabetes mellitus), type 2 with peripheral vascular complications (HCC)   HTN (hypertension)   Memory loss   Atrial fibrillation with RVR (HCC)   Chronic combined systolic and diastolic congestive heart failure (HCC)   CKD (chronic kidney disease), stage III (HCC)   Seizure (HCC)  Acute metabolic encephalopathy: Due to postictal state, possibly related to arrhythmia, also likely an element of acute delirium as she's struggled with that in the hospital in the past.  Also has had subacute decline in mood, appetite, etc. following BKA, so suspect depression also contributing on longer time scale.  - Delirium precautions - Will hold sedating medications as able. Do not want to precipitate gabapentin withdrawal, so restarted this at previous dose. - Treat UTI as below  UTI: Pyuria on UA (delayed result). Also w/leukocytosis. Unclear whether this is contributing to AMS and/or arrhythmia.  - Send urine culture, then start CTX     Wide complex tachycardia, possibly ventricular tachycardia, though cardiology favors persistent AFib with RVR:  - Appreciate cardiology/EP assistance. Restart amio gtt due to reversion to rapid AFib. Fortunately not very symptomatic at this time. CHA2DS2-VASc score is 5, restarted eliquis. Not planning to restart sotalol. - Echocardiogram with EF 60-65%, G1DD, no regional WMA. mild-mod AS.  - Continue closely following K and Mg, will give 2g Mg and 61mEq K, recheck in PM.    Troponin elevation: Suspect demand ischemia in setting of arrhythmia, also suspected to be etiology of lactic acid elevation. Lactate has cleared, troponin still with elevation as expected with CAD and significant demand. No intervention planned per cardiology.  New onset seizures/seizure-like activity: No intracerebral abnormalities on imaging, EEG the following day is interpreted as a normal EEG in sleeping and awake states.  - Stopped tramadol.  - Since it is first episode and in setting of dehydration, arrhythmia, and EEG normal will not start AED at this time.  - prn ativan  Nausea: Waxing/waning PTA. Has been evaluated by GI as outpatient.  - Antiemetics prn, tolerating diet, advance as tolerated.  Osteoarthritis of left shoulder, chronic pain,  s/p right BKA: Normal wound healing. - Not many options for pain management with allergies to codeine, dilaudid, oxycodone, as well as CKD precluding NSAIDs and seizures precluding tramadol. Seems to be improved on  hydrocodone prn with good tolerance. - Orthopedic follow up as scheduled.   IDT2DM: Last HBA1c 8% - Continue long-acting insulin 10 from 20u qHS. Covering with humalog. Fasting CBGs nearly at goal, but prandials elevated, so will augment sliding scale today.   Stage III CKD:  - Monitor, avoid nephrotoxins.   HTN:  - Continue norvasc.   OSA:  - Continue CPAP qHS  Hyperthyroidism:  - Continue tapazole. TSH wnl.  Depression with adjustment disorder:  - Continue home medications - Needs outpatient therapy  DVT prophylaxis: Eliquis. Code Status: Full Family Communication: Noneat bedside Disposition Plan: Unfortunately reverted to rapid AFib. Will need to restart amiodarone gtt, remain in 2H. Anticipate level of functioning appropriate for home at DC.  Consultants:   Cardiology/EP  Procedures:   Echocardiogram 05/16/2018: - Left ventricle: The cavity size was normal. Wall thickness was   increased in a pattern of severe LVH. Systolic function was   normal. The estimated ejection fraction was in the range of 60%   to 65%. Wall motion was normal; there were no regional wall   motion abnormalities. There was an increased relative   contribution of atrial contraction to ventricular filling.   Doppler parameters are consistent with abnormal left ventricular   relaxation (grade 1 diastolic dysfunction). Doppler parameters   are consistent with high ventricular filling pressure. - Aortic valve: Valve mobility was restricted. There was mild to   moderate stenosis. Mean gradient (S): 17 mm Hg. VTI ratio of LVOT   to aortic valve: 0.36. Valve area (VTI): 1.37 cm^2. Valve area   (Vmax): 1.22 cm^2. Valve area (Vmean): 1.27 cm^2. - Mitral valve: Calcified annulus. Valve area by pressure   half-time: 2.39 cm^2. - Left atrium: The atrium was moderately dilated. - Pulmonary arteries: Systolic pressure could not be accurately   estimated.  Antimicrobials:  None   Subjective: Had some  mild dyspnea this morning, but denies feeling like she did before with arrhythmias. She has not chest pain dyspnea or palpitations currently. Pain is stable in shoulder, no abd pain, N/V/D.   Objective: Vitals:   05/17/18 1400 05/17/18 1500 05/17/18 1600 05/17/18 1623  BP: 127/82 116/77 127/82   Pulse: (!) 124 (!) 131 (!) 128   Resp: 15 19 14    Temp:    98.6 F (37 C)  TempSrc:    Oral  SpO2: 99% 97% 99%     Intake/Output Summary (Last 24 hours) at 05/17/2018 1707 Last data filed at 05/17/2018 1600 Gross per 24 hour  Intake 1230 ml  Output 1800 ml  Net -570 ml   Gen: Chronically ill-appearing, pleasant female in no distress Pulm: Nonlabored breathing room air. Clear. CV: Irregular tachycardia. No murmur, rub, or gallop. No JVD, no dependent edema. GI: Abdomen soft, non-tender, non-distended, with normoactive bowel sounds.  Ext: Warm, right BKA stable Skin: No new rashes, lesions or ulcers on visualized skin. Neuro: Alert, oriented today. No focal neurological deficits. Psych: Judgement and insight appear fair. Mood euthymic & affect congruent. Behavior is appropriate.    Data Reviewed: I have personally reviewed following labs and imaging studies  CBC: Recent Labs  Lab 05/14/18 1052 05/15/18 0215  WBC 12.6* 13.5*  NEUTROABS 8.3*  --   HGB 13.9 12.5  HCT 45.5 38.0  MCV 88.2 85.4  PLT 624* 951*   Basic Metabolic Panel: Recent Labs  Lab 05/14/18 1052  05/14/18 2046  05/15/18 0215 05/15/18 1103 05/16/18 0346 05/17/18 0356 05/17/18 1134  NA 133*  --  135  --   --   --  135 135  --   K 3.4*  --  4.1  --  3.6  --  3.0* 3.7 4.8  CL 97*  --  101  --   --   --  102 104  --   CO2 13*  --  13*  --   --   --  21* 22  --   GLUCOSE 337*  --  242*  --   --   --  159* 131*  --   BUN 24*  --  24*  --   --   --  24* 23  --   CREATININE 1.39*  --  1.13*  --   --   --  1.39* 1.16*  --   CALCIUM 7.8*  --  7.6*  --   --   --  8.3* 9.6  --   MG  --    < >  --    < > 2.0 1.9 2.3 1.8  2.2   < > = values in this interval not displayed.   GFR: CrCl cannot be calculated (Unknown ideal weight.). Liver Function Tests: Recent Labs  Lab 05/14/18 1052  AST 32  ALT 14  ALKPHOS 57  BILITOT 0.6  PROT 8.6*  ALBUMIN 3.6   Recent Labs  Lab 05/14/18 1052  LIPASE 35   No results for input(s): AMMONIA in the last 168 hours. Coagulation Profile: No results for input(s): INR, PROTIME in the last 168 hours. Cardiac Enzymes: Recent Labs  Lab 05/14/18 1446 05/14/18 2046 05/15/18 0215 05/15/18 1755  TROPONINI 0.07* 0.20* 0.68* 1.16*   BNP (last 3 results) No results for input(s): PROBNP in the last 8760 hours. HbA1C: No results for input(s): HGBA1C in the last 72 hours. CBG: Recent Labs  Lab 05/16/18 2346 05/17/18 0354 05/17/18 0828 05/17/18 1145 05/17/18 1621  GLUCAP 195* 128* 156* 314* 207*   Lipid Profile: No results for input(s): CHOL, HDL, LDLCALC, TRIG, CHOLHDL, LDLDIRECT in the last 72 hours. Thyroid Function Tests: Recent Labs    05/15/18 0215  TSH 1.013   Anemia Panel: No results for input(s): VITAMINB12, FOLATE, FERRITIN, TIBC, IRON, RETICCTPCT in the last 72 hours. Urine analysis:    Component Value Date/Time   COLORURINE YELLOW 05/14/2018 1119   APPEARANCEUR CLOUDY (A) 05/14/2018 1119   LABSPEC 1.017 05/14/2018 1119   PHURINE 5.0 05/14/2018 1119   GLUCOSEU >=500 (A) 05/14/2018 1119   HGBUR SMALL (A) 05/14/2018 1119   BILIRUBINUR NEGATIVE 05/14/2018 1119   BILIRUBINUR negative 07/22/2017 0858   BILIRUBINUR Small 12/06/2014 1441   KETONESUR NEGATIVE 05/14/2018 1119   PROTEINUR 100 (A) 05/14/2018 1119   UROBILINOGEN 0.2 07/22/2017 0858   UROBILINOGEN 1.0 02/12/2015 1238   NITRITE NEGATIVE 05/14/2018 1119   LEUKOCYTESUR LARGE (A) 05/14/2018 1119   No results found for this or any previous visit (from the past 240 hour(s)).    Radiology Studies: No results found.  Scheduled Meds: . amLODipine  7.5 mg Oral Daily  . apixaban  5 mg  Oral BID  . atorvastatin  20 mg Oral q1800  . buPROPion  150 mg Oral BID  . FLUoxetine  20 mg Oral QHS  . gabapentin  900 mg Oral BID  . insulin glargine  10 Units Subcutaneous QHS  . insulin lispro  2-6 Units Subcutaneous Q4H  . methimazole  10 mg Oral Daily  . pantoprazole  40 mg Oral BID  . sodium chloride flush  3 mL Intravenous Q12H   Continuous Infusions: . sodium chloride Stopped (05/15/18 1725)  . amiodarone 30 mg/hr (05/17/18 1445)     LOS: 3 days   Time spent: 35 minutes.  Patrecia Pour, MD [facility] [email or web address] Password [facility] 05/17/2018, 5:07 PM

## 2018-05-17 NOTE — Progress Notes (Signed)
OT Cancellation Note  Patient Details Name: Judith [name] MRN: [id number] DOB: 06-15-1950   Cancelled Treatment:    Reason Eval/Treat Not Completed: Medical issues which prohibited therapy(Pt with resting HR in 150s. Will follow.)  [name] 05/17/2018, 8:37 AM  Nestor Lewandowsky, OTR/L Acute Rehabilitation Services Pager: [phone] Office: [phone]

## 2018-05-17 NOTE — Evaluation (Signed)
Occupational Therapy Evaluation Patient Details Name: Judith [name] MRN: [id number] DOB: 07/30/1950 Today's Date: 05/17/2018    History of Present Illness Pt is a 68 y.o. F with significant PMH of right transtibial amputation, chronic kidney disease, CAD, diabetes mellitus with peripheral neuropathy, paroxysmal atrial fibrillation, who presents after an unresponsive episode. She was found to have wide complex tachycardia and converted to Afib with RVR.   Clinical Impression   Pt was assisted for showering and IADL and using a w/c for mobility performing squat pivot transfers prior to admission. Pt presents with generalized weakness and decreased activity tolerance. She requires up to min assist for ADL and transfers with min guard assist. Pt with HR in 120s throughout session. Pt likely to progress well to return home with her sister and [facility] therapies. Will follow.    Follow Up Recommendations  Home health OT    Equipment Recommendations  None recommended by OT    Recommendations for Other Services       Precautions / Restrictions Precautions Precautions: Fall Precaution Comments: right BKA Restrictions Weight Bearing Restrictions: Yes RLE Weight Bearing: Non weight bearing      Mobility Bed Mobility Overal bed mobility: Needs Assistance Bed Mobility: Supine to Sit     Supine to sit: Mod assist     General bed mobility comments: Pt with good initiation and reaching for bed rail, requiring modA for lift assist at trunk  Transfers Overall transfer level: Needs assistance Equipment used: None Transfers: Stand Pivot Transfers     Squat pivot transfers: Min guard     General transfer comment: Min guard assist for squat pivot transfer towards left. Cues for safety    Balance Overall balance assessment: Needs assistance Sitting-balance support: Feet unsupported Sitting balance-Leahy Scale: Fair                                     ADL either performed  or assessed with clinical judgement   ADL Overall ADL's : Needs assistance/impaired Eating/Feeding: Independent;Sitting   Grooming: Set up;Sitting   Upper Body Bathing: Set up;Sitting   Lower Body Bathing: Minimal assistance;Sitting/lateral leans   Upper Body Dressing : Set up;Sitting   Lower Body Dressing: Minimal assistance;Sitting/lateral leans   Toilet Transfer: Min guard;Squat-pivot;BSC   Toileting- Clothing Manipulation and Hygiene: Minimal assistance;Sitting/lateral lean               Vision Baseline Vision/History: Wears glasses Wears Glasses: At all times Patient Visual Report: No change from baseline Vision Assessment?: No apparent visual deficits     Perception     Praxis      Pertinent Vitals/Pain Pain Assessment: Faces Faces Pain Scale: No hurt     Hand Dominance Right   Extremity/Trunk Assessment Upper Extremity Assessment Upper Extremity Assessment: Overall WFL for tasks assessed   Lower Extremity Assessment Lower Extremity Assessment: Defer to PT evaluation RLE Deficits / Details: Recent BKA. At least anti gravity strength and able to reach neutral extension LLE Deficits / Details: Generalized weakness       Communication Communication Communication: No difficulties   Cognition Arousal/Alertness: Awake/alert Behavior During Therapy: WFL for tasks assessed/performed Overall Cognitive Status: Impaired/Different from baseline Area of Impairment: Memory                     Memory: Decreased short-term memory         General Comments: Very pleasant and  eager to participate   General Comments       Exercises     Shoulder Instructions      Home Living Family/patient expects to be discharged to:: Private residence Living Arrangements: Other relatives(sister) Available Help at Discharge: Family;Available PRN/intermittently Type of Home: House Home Access: Ramped entrance     Home Layout: Laundry or work area in  basement;One level     Bathroom Shower/Tub: Occupational psychologist: Standard Bathroom Accessibility: No   Home Equipment: Environmental consultant - 2 wheels;Wheelchair - Education administrator (comment);Shower seat;Grab bars - toilet;Grab bars - tub/shower;Hand held shower head      Lives With: Family    Prior Functioning/Environment Level of Independence: Needs assistance  Gait / Transfers Assistance Needed: Performing squat pivot transfers towards left and mobilizing using wheelchair. On 12/24 discharge from CIR, she was ambulating 5 feet with walker ADL's / Homemaking Assistance Needed: Pt sister assists with shower, pt requiring set up assist for dressing in supine position, assisted for IADL            OT Problem List: Decreased activity tolerance;Impaired balance (sitting and/or standing);Decreased knowledge of use of DME or AE;Decreased strength      OT Treatment/Interventions: Self-care/ADL training;DME and/or AE instruction;Patient/family education;Balance training;Therapeutic activities    OT Goals(Current goals can be found in the care plan section) Acute Rehab OT Goals Patient Stated Goal: "Get stronger." OT Goal Formulation: With patient Time For Goal Achievement: 05/24/18 Potential to Achieve Goals: Good ADL Goals Pt Will Perform Lower Body Bathing: with modified independence;sitting/lateral leans Pt Will Perform Lower Body Dressing: with modified independence;sitting/lateral leans Pt Will Transfer to Toilet: with modified independence;squat pivot transfer Pt Will Perform Toileting - Clothing Manipulation and hygiene: with modified independence;sitting/lateral leans  OT Frequency: Min 2X/week   Barriers to D/C:            Co-evaluation PT/OT/SLP Co-Evaluation/Treatment: Yes Reason for Co-Treatment: For patient/therapist safety PT goals addressed during session: Mobility/safety with mobility OT goals addressed during session: ADL's and self-care      AM-PAC OT "6  Clicks" Daily Activity     Outcome Measure Help from another person eating meals?: None Help from another person taking care of personal grooming?: None Help from another person toileting, which includes using toliet, bedpan, or urinal?: A Little Help from another person bathing (including washing, rinsing, drying)?: A Little Help from another person to put on and taking off regular upper body clothing?: None Help from another person to put on and taking off regular lower body clothing?: A Little 6 Click Score: 21   End of Session Equipment Utilized During Treatment: Gait belt Nurse Communication: Mobility status  Activity Tolerance: Patient tolerated treatment well Patient left: in chair;with call bell/phone within reach;with chair alarm set;with nursing/sitter in room  OT Visit Diagnosis: Unsteadiness on feet (R26.81)                Time: 0539-7673 OT Time Calculation (min): 20 min Charges:  OT General Charges $OT Visit: 1 Visit OT Evaluation $OT Eval Moderate Complexity: 1 Mod  Nestor Lewandowsky, OTR/L Acute Rehabilitation Services Pager: [phone] Office: [phone] [name] 05/17/2018, 4:32 PM

## 2018-05-17 NOTE — Progress Notes (Signed)
Pt placed self on home cpap unit. RT will continue to monitor as needed.

## 2018-05-17 NOTE — Progress Notes (Signed)
Progress Note  Patient Name: Judith [name] Date of Encounter: 05/17/2018  Primary Cardiologist: Fransico Him, MD   Subjective   Denies chest pain or sob. Mild palpitations  Inpatient Medications    Scheduled Meds: . amiodarone  150 mg Intravenous Once  . amLODipine  7.5 mg Oral Daily  . apixaban  5 mg Oral BID  . atorvastatin  20 mg Oral q1800  . buPROPion  150 mg Oral BID  . FLUoxetine  20 mg Oral QHS  . gabapentin  900 mg Oral BID  . insulin glargine  10 Units Subcutaneous QHS  . insulin lispro  2-6 Units Subcutaneous Q4H  . methimazole  10 mg Oral Daily  . pantoprazole  40 mg Oral BID  . sodium chloride flush  3 mL Intravenous Q12H   Continuous Infusions: . sodium chloride Stopped (05/15/18 1725)  . amiodarone    . amiodarone     Followed by  . amiodarone    . magnesium sulfate 1 - 4 g bolus IVPB 2 g (05/17/18 0744)   PRN Meds: sodium chloride, HYDROcodone-acetaminophen, levalbuterol, ondansetron **OR** ondansetron (ZOFRAN) IV, sodium chloride flush   Vital Signs    Vitals:   05/17/18 0745 05/17/18 0800 05/17/18 0815 05/17/18 0829  BP: (!) 127/92 130/89 (!) 150/110   Pulse: (!) 130 (!) 137 (!) 31   Resp: 18 19 17    Temp:    97.9 F (36.6 C)  TempSrc:    Oral  SpO2: 94% 91% 100%     Intake/Output Summary (Last 24 hours) at 05/17/2018 0840 Last data filed at 05/17/2018 0700 Gross per 24 hour  Intake 1226.84 ml  Output 1450 ml  Net -223.16 ml   There were no vitals filed for this visit.  Telemetry    Atrial fib with a RVR - Personally Reviewed  ECG    none - Personally Reviewed  Physical Exam   GEN: No acute distress.   Neck: No JVD Cardiac: IRIRR, no murmurs, rubs, or gallops.  Respiratory: Clear to auscultation bilaterally. GI: Soft, nontender, non-distended  MS: No edema; No deformity. Neuro:  Nonfocal  Psych: Normal affect   Labs    Chemistry Recent Labs  Lab 05/14/18 1052 05/14/18 2046 05/15/18 0215 05/16/18 0346  05/17/18 0356  NA 133* 135  --  135 135  K 3.4* 4.1 3.6 3.0* 3.7  CL 97* 101  --  102 104  CO2 13* 13*  --  21* 22  GLUCOSE 337* 242*  --  159* 131*  BUN 24* 24*  --  24* 23  CREATININE 1.39* 1.13*  --  1.39* 1.16*  CALCIUM 7.8* 7.6*  --  8.3* 9.6  PROT 8.6*  --   --   --   --   ALBUMIN 3.6  --   --   --   --   AST 32  --   --   --   --   ALT 14  --   --   --   --   ALKPHOS 57  --   --   --   --   BILITOT 0.6  --   --   --   --   GFRNONAA 40* 51*  --  40* 49*  GFRAA 46* 59*  --  46* 57*  ANIONGAP 23* 21*  --  12 9     Hematology Recent Labs  Lab 05/14/18 1052 05/15/18 0215  WBC 12.6* 13.5*  RBC 5.16* 4.45  HGB  13.9 12.5  HCT 45.5 38.0  MCV 88.2 85.4  MCH 26.9 28.1  MCHC 30.5 32.9  RDW 13.5 13.9  PLT 624* 474*    Cardiac Enzymes Recent Labs  Lab 05/14/18 1446 05/14/18 2046 05/15/18 0215 05/15/18 1755  TROPONINI 0.07* 0.20* 0.68* 1.16*    Recent Labs  Lab 05/14/18 1050  TROPIPOC 0.02     BNPNo results for input(s): BNP, PROBNP in the last 168 hours.   DDimer No results for input(s): DDIMER in the last 168 hours.   Radiology    No results found.  Cardiac Studies   none  Patient Profile     68 y.o. female admitted with atrial fib and RVR, wide QRS tachy  Assessment & Plan    1. Atrial fib with a RVR - she had gone back to NSR on IV amio and we stopped the IV amiodarone and she has gone back into atrial fib with a RVR. She will be restarted on IV amio. 2. Wide QRS tachy - I think that this represented aberrant atrial fib.  3.  CAD - she denies anginal symptoms.      For questions or updates, please contact [facility] Please consult [email or web address] for contact info under Cardiology/STEMI.      Signed, Cristopher Peru, MD  05/17/2018, 8:40 AM  Patient ID: Judith [name], female   DOB: 06-25-1950, 68 y.o.   MRN: [id number]

## 2018-05-18 ENCOUNTER — Encounter: Payer: Medicare PPO | Attending: Physical Medicine & Rehabilitation | Admitting: Physical Medicine & Rehabilitation

## 2018-05-18 ENCOUNTER — Encounter: Payer: Self-pay | Admitting: Cardiology

## 2018-05-18 LAB — CBC
HCT: 36 % (ref 36.0–46.0)
Hemoglobin: 11.3 g/dL — ABNORMAL LOW (ref 12.0–15.0)
MCH: 27.2 pg (ref 26.0–34.0)
MCHC: 31.4 g/dL (ref 30.0–36.0)
MCV: 86.5 fL (ref 80.0–100.0)
NRBC: 0 % (ref 0.0–0.2)
Platelets: 364 10*3/uL (ref 150–400)
RBC: 4.16 MIL/uL (ref 3.87–5.11)
RDW: 13.7 % (ref 11.5–15.5)
WBC: 9.2 10*3/uL (ref 4.0–10.5)

## 2018-05-18 LAB — BASIC METABOLIC PANEL
Anion gap: 9 (ref 5–15)
Anion gap: 9 (ref 5–15)
BUN: 20 mg/dL (ref 8–23)
BUN: 17 mg/dL (ref 8–23)
CALCIUM: 10 mg/dL (ref 8.9–10.3)
CO2: 21 mmol/L — AB (ref 22–32)
CO2: 21 mmol/L — ABNORMAL LOW (ref 22–32)
Calcium: 10.1 mg/dL (ref 8.9–10.3)
Chloride: 103 mmol/L (ref 98–111)
Chloride: 104 mmol/L (ref 98–111)
Creatinine, Ser: 1.14 mg/dL — ABNORMAL HIGH (ref 0.44–1.00)
Creatinine, Ser: 1.03 mg/dL — ABNORMAL HIGH (ref 0.44–1.00)
GFR calc Af Amer: >60 mL/min (ref >60)
GFR calc non Af Amer: 50 mL/min — ABNORMAL LOW (ref >60)
GFR calc non Af Amer: 57 mL/min — ABNORMAL LOW (ref >60)
GFR, EST AFRICAN AMERICAN: 58 mL/min — AB (ref >60)
GLUCOSE: 264 mg/dL — AB (ref 70–99)
Glucose, Bld: 160 mg/dL — ABNORMAL HIGH (ref 70–99)
POTASSIUM: 4.8 mmol/L (ref 3.5–5.1)
Potassium: 4.4 mmol/L (ref 3.5–5.1)
Sodium: 133 mmol/L — ABNORMAL LOW (ref 135–145)
Sodium: 134 mmol/L — ABNORMAL LOW (ref 135–145)

## 2018-05-18 LAB — GLUCOSE, CAPILLARY
GLUCOSE-CAPILLARY: 171 mg/dL — AB (ref 70–99)
Glucose-Capillary: 425 mg/dL — ABNORMAL HIGH (ref 70–99)
Glucose-Capillary: 210 mg/dL — ABNORMAL HIGH (ref 70–99)
Glucose-Capillary: 171 mg/dL — ABNORMAL HIGH (ref 70–99)
Glucose-Capillary: 233 mg/dL — ABNORMAL HIGH (ref 70–99)
Glucose-Capillary: 259 mg/dL — ABNORMAL HIGH (ref 70–99)

## 2018-05-18 LAB — MAGNESIUM
Magnesium: 1.5 mg/dL — ABNORMAL LOW (ref 1.7–2.4)
Magnesium: 1.9 mg/dL (ref 1.7–2.4)

## 2018-05-18 MED ORDER — INSULIN LISPRO (1 UNIT DIAL) 100 UNIT/ML (KWIKPEN)
4.0000 [IU] | PEN_INJECTOR | SUBCUTANEOUS | Status: DC
  Administered 2018-05-18: 8 [IU] via SUBCUTANEOUS
  Administered 2018-05-18: 6 [IU] via SUBCUTANEOUS
  Filled 2018-05-18: qty 3

## 2018-05-18 MED ORDER — MAGNESIUM SULFATE 4 GM/100ML IV SOLN
4.0000 g | Freq: Once | INTRAVENOUS | Status: AC
  Administered 2018-05-18: 4 g via INTRAVENOUS
  Filled 2018-05-18: qty 100

## 2018-05-18 MED ORDER — INSULIN GLARGINE 100 UNIT/ML ~~LOC~~ SOLN
10.0000 [IU] | Freq: Two times a day (BID) | SUBCUTANEOUS | Status: DC
  Administered 2018-05-18 – 2018-05-23 (×11): 10 [IU] via SUBCUTANEOUS
  Filled 2018-05-18 (×13): qty 0.1

## 2018-05-18 MED ORDER — MAGNESIUM SULFATE 2 GM/50ML IV SOLN
2.0000 g | Freq: Once | INTRAVENOUS | Status: AC
  Administered 2018-05-18: 2 g via INTRAVENOUS
  Filled 2018-05-18: qty 50

## 2018-05-18 MED ORDER — INSULIN LISPRO (1 UNIT DIAL) 100 UNIT/ML (KWIKPEN)
4.0000 [IU] | PEN_INJECTOR | Freq: Three times a day (TID) | SUBCUTANEOUS | Status: DC
  Administered 2018-05-18: 12 [IU] via SUBCUTANEOUS
  Administered 2018-05-18: 6 [IU] via SUBCUTANEOUS
  Administered 2018-05-19: 4 [IU] via SUBCUTANEOUS
  Administered 2018-05-19: 8 [IU] via SUBCUTANEOUS
  Filled 2018-05-18: qty 3

## 2018-05-18 NOTE — Progress Notes (Signed)
Pt placed self on home cpap unit. RT will continue to monitor as needed.

## 2018-05-18 NOTE — Progress Notes (Signed)
Inpatient Diabetes Program Recommendations  AACE/ADA: New Consensus Statement on Inpatient Glycemic Control (2015)  Target Ranges:  Prepandial:   less than 140 mg/dL      Peak postprandial:   less than 180 mg/dL (1-2 hours)      Critically ill patients:  140 - 180 mg/dL   Lab Results  Component Value Date   GLUCAP 233 (H) 05/18/2018   HGBA1C 8.0 (H) 04/14/2018    Review of Glycemic Control Results for SAIDY, ORMAND (MRN [id number]) as of 05/18/2018 13:44  Ref. Range 05/17/2018 19:36 05/18/2018 00:40 05/18/2018 03:18 05/18/2018 08:03 05/18/2018 11:13  Glucose-Capillary Latest Ref Range: 70 - 99 mg/dL 225 (H) 425 (H) 210 (H) 171 (H) 233 (H)   Diabetes history: DM 2 Outpatient Diabetes medications: Tresiba 20 units q PM Current orders for Inpatient glycemic control:  Lantus 10 units bid, Humalog 4-18 units q 4 hours Inpatient Diabetes Program Recommendations:   May consider changing Humalog correction to tid with meals and HS (instead of q 4 hours).   Thanks,  Adah Perl, RN, BC-ADM Inpatient Diabetes Coordinator Pager [phone] (8a-5p)

## 2018-05-18 NOTE — Progress Notes (Signed)
PROGRESS NOTE  Judith [name]  OZH:[id number] DOB: 02/26/1951 DOA: 05/14/2018 PCP: Rutherford Guys, MD  Brief Narrative: Judith [name] is a 68 y.o. female with a history of AFib, VT, stage III CKD, recent right BKA, and T2DM who presented to the ED with an unresponsive episode. She is living with her sister since undergoing right BKA recently and has been failing to thrive generally, eating less, getting up less, but on 1/5 noted palpitations typical of previous episodes of AFib with RVR. Her sister drove her to the ED when en route the patient had 2-3 minutes of tensing bilateral arms, being unresponsive with eyes rolled in the back of her head, maintaining postural tone in the seat, then began moving all limbs, bumping the BKA stump against the dashboard which would normally cause excruciating pain though didn't seem to cause pain. On arrival she was found to have a wide complex tachycardia, given amiodarone bolus and metoprolol with conversion to narrow AFib with RVR. Mental status had improved when the patient had another episode lasting about 3 minutes terminated with ativan 2mg  IV, postictal-appearing thereafter. Mental status has waxed and waned consistent with delirium, but has largely returned to baseline. Nausea has resolved and she's eating a normal diet, has had no further seizure-like activity. Heart rhythm converted to NSR overnight into 05/15/2018, though with transition to po amiodarone 1/8, went back into AFib/aflutter prompting restart of amiodarone infusion.  Assessment & Plan: Principal Problem:   V-tach [facility]) Active Problems:   DM (diabetes mellitus), type 2 with peripheral vascular complications (HCC)   HTN (hypertension)   Memory loss   Atrial fibrillation with RVR (HCC)   Chronic combined systolic and diastolic congestive heart failure (HCC)   CKD (chronic kidney disease), stage III (HCC)   Seizure (HCC)  Acute metabolic encephalopathy: Due to postictal state, possibly  related to arrhythmia, also likely an element of acute delirium as she's struggled with that in the hospital in the past. Also has had subacute decline in mood, appetite, etc. following BKA, so suspect depression also contributing on longer time scale.  - Delirium precautions - Will hold sedating medications as able. Do not want to precipitate gabapentin withdrawal, so restarted this at previous dose. - Treat UTI as below  UTI: Pyuria on UA. Also w/leukocytosis. Unclear whether this is contributing to AMS and/or arrhythmia.  - Started CTX - Monitor urine culture.   - Check CBC.  Wide complex tachycardia, possibly ventricular tachycardia, though cardiology favors persistent AFib with RVR, AFlutter: Echocardiogram with EF 60-65%, G1DD, no regional WMA. mild-mod AS. - Appreciate cardiology/EP assistance. Restarted amio gtt due to reversion to rapid AFib. AFlutter this AM, continue gtt per cards. Fortunately not very symptomatic at this time. CHA2DS2-VASc score is 5, restarted eliquis. Not planning to restart sotalol. - Recurrent hypomagnesemia: Given 4g this AM. Will recheck K, Mg this PM.   Troponin elevation: Suspect demand ischemia in setting of arrhythmia, also suspected to be etiology of lactic acid elevation. Lactate has cleared, troponin still with elevation as expected with CAD and significant demand. No intervention planned per cardiology. ECG 1/9 read as STEMI, false positive per cardiology. No chest pain.  New onset seizures/seizure-like activity: No intracerebral abnormalities on imaging, EEG the following day is interpreted as a normal EEG in sleeping and awake states.  - Stopped tramadol.  - Since it is first episode and in setting of dehydration, arrhythmia, and EEG normal will not start AED at this time.  - prn ativan  Nausea: Waxing/waning PTA. Has been evaluated by GI as outpatient.  - Antiemetics prn, tolerating diet, advance as tolerated.  Osteoarthritis of left shoulder,  chronic pain, s/p right BKA: Normal wound healing. - Seems to be improved on hydrocodone prn with good tolerance. - Orthopedic follow up as scheduled.   IDT2DM: Last HBA1c 8%. Recently increasing CBGs with improved po intake. Alertnative/additional explanation would be undiscovered infection. - Add AM dose of long-acting insulin, 10u BID. Increase humalog scale.   Stage III CKD:  - Monitor, avoid nephrotoxins.   HTN:  - Continue norvasc.   OSA:  - Continue CPAP qHS  Hyperthyroidism:  - Continue tapazole. TSH wnl.  Depression with adjustment disorder:  - Continue home medications - Needs outpatient therapy  DVT prophylaxis: Eliquis. Code Status: Full Family Communication: None at bedside Disposition Plan: Uncertain. Remain in 2H due to uncontrolled heart rate/rhythm. Consultants:   Cardiology/EP  Procedures:   Echocardiogram 05/16/2018: - Left ventricle: The cavity size was normal. Wall thickness was   increased in a pattern of severe LVH. Systolic function was   normal. The estimated ejection fraction was in the range of 60%   to 65%. Wall motion was normal; there were no regional wall   motion abnormalities. There was an increased relative   contribution of atrial contraction to ventricular filling.   Doppler parameters are consistent with abnormal left ventricular   relaxation (grade 1 diastolic dysfunction). Doppler parameters   are consistent with high ventricular filling pressure. - Aortic valve: Valve mobility was restricted. There was mild to   moderate stenosis. Mean gradient (S): 17 mm Hg. VTI ratio of LVOT   to aortic valve: 0.36. Valve area (VTI): 1.37 cm^2. Valve area   (Vmax): 1.22 cm^2. Valve area (Vmean): 1.27 cm^2. - Mitral valve: Calcified annulus. Valve area by pressure   half-time: 2.39 cm^2. - Left atrium: The atrium was moderately dilated. - Pulmonary arteries: Systolic pressure could not be accurately    estimated.  Antimicrobials:  Ceftriaxone 1/8 >>   Subjective: Denies any chest pain, dyspnea, palpitations. Uneventful evening and morning per her. Denies any cough, fever, chills, dysuria, urinary urgency or frequency, diarrhea, abd pain, increasing arthralgia/myalgia.   Objective: Vitals:   05/18/18 1030 05/18/18 1150 05/18/18 1200 05/18/18 1237  BP: 130/81 122/84 126/74   Pulse: (!) 138 (!) 129 (!) 129   Resp: 16 18 18    Temp:    98.3 F (36.8 C)  TempSrc:    Oral  SpO2: 98% 100% 100%     Intake/Output Summary (Last 24 hours) at 05/18/2018 1237 Last data filed at 05/18/2018 1200 Gross per 24 hour  Intake 2043.1 ml  Output 2200 ml  Net -156.9 ml   Gen: Chronically ill-appearing 68 y.o. female in no distress Pulm: Nonlabored breathing room air. Clear. CV: Regular tachycardia in 130's, 2:1 aflutter on monitor. No murmur, rub, or gallop. No JVD, no dependent edema. GI: Abdomen soft, non-tender, non-distended, with normoactive bowel sounds.  Ext: Warm, R BKA stable with compression on, no new deformities Skin: No new rashes, lesions or ulcers on visualized skin. Neuro: Alert and oriented. No focal neurological deficits. Psych: Judgement and insight appear fair. Mood euthymic & affect congruent. Behavior is appropriate.    Data Reviewed: I have personally reviewed following labs and imaging studies  CBC: Recent Labs  Lab 05/14/18 1052 05/15/18 0215  WBC 12.6* 13.5*  NEUTROABS 8.3*  --   HGB 13.9 12.5  HCT 45.5 38.0  MCV 88.2 85.4  PLT 624* 993*   Basic Metabolic Panel: Recent Labs  Lab 05/14/18 1052  05/14/18 2046  05/15/18 0215 05/15/18 1103 05/16/18 0346 05/17/18 0356 05/17/18 1134 05/18/18 0420  NA 133*  --  135  --   --   --  135 135  --  133*  K 3.4*  --  4.1  --  3.6  --  3.0* 3.7 4.8 4.4  CL 97*  --  101  --   --   --  102 104  --  103  CO2 13*  --  13*  --   --   --  21* 22  --  21*  GLUCOSE 337*  --  242*  --   --   --  159* 131*  --  264*  BUN 24*   --  24*  --   --   --  24* 23  --  20  CREATININE 1.39*  --  1.13*  --   --   --  1.39* 1.16*  --  1.14*  CALCIUM 7.8*  --  7.6*  --   --   --  8.3* 9.6  --  10.0  MG  --    < >  --    < > 2.0 1.9 2.3 1.8 2.2 1.5*   < > = values in this interval not displayed.   GFR: CrCl cannot be calculated (Unknown ideal weight.). Liver Function Tests: Recent Labs  Lab 05/14/18 1052  AST 32  ALT 14  ALKPHOS 57  BILITOT 0.6  PROT 8.6*  ALBUMIN 3.6   Recent Labs  Lab 05/14/18 1052  LIPASE 35   No results for input(s): AMMONIA in the last 168 hours. Coagulation Profile: No results for input(s): INR, PROTIME in the last 168 hours. Cardiac Enzymes: Recent Labs  Lab 05/14/18 1446 05/14/18 2046 05/15/18 0215 05/15/18 1755  TROPONINI 0.07* 0.20* 0.68* 1.16*   BNP (last 3 results) No results for input(s): PROBNP in the last 8760 hours. HbA1C: No results for input(s): HGBA1C in the last 72 hours. CBG: Recent Labs  Lab 05/17/18 1936 05/18/18 0040 05/18/18 0318 05/18/18 0803 05/18/18 1113  GLUCAP 225* 425* 210* 171* 233*   Lipid Profile: No results for input(s): CHOL, HDL, LDLCALC, TRIG, CHOLHDL, LDLDIRECT in the last 72 hours. Thyroid Function Tests: No results for input(s): TSH, T4TOTAL, FREET4, T3FREE, THYROIDAB in the last 72 hours. Anemia Panel: No results for input(s): VITAMINB12, FOLATE, FERRITIN, TIBC, IRON, RETICCTPCT in the last 72 hours. Urine analysis:    Component Value Date/Time   COLORURINE YELLOW 05/14/2018 1119   APPEARANCEUR CLOUDY (A) 05/14/2018 1119   LABSPEC 1.017 05/14/2018 1119   PHURINE 5.0 05/14/2018 1119   GLUCOSEU >=500 (A) 05/14/2018 1119   HGBUR SMALL (A) 05/14/2018 1119   BILIRUBINUR NEGATIVE 05/14/2018 1119   BILIRUBINUR negative 07/22/2017 0858   BILIRUBINUR Small 12/06/2014 1441   KETONESUR NEGATIVE 05/14/2018 1119   PROTEINUR 100 (A) 05/14/2018 1119   UROBILINOGEN 0.2 07/22/2017 0858   UROBILINOGEN 1.0 02/12/2015 1238   NITRITE NEGATIVE  05/14/2018 1119   LEUKOCYTESUR LARGE (A) 05/14/2018 1119   No results found for this or any previous visit (from the past 240 hour(s)).    Radiology Studies: No results found.  Scheduled Meds: . amLODipine  7.5 mg Oral Daily  . apixaban  5 mg Oral BID  . atorvastatin  20 mg Oral q1800  . buPROPion  150 mg Oral BID  . FLUoxetine  20 mg Oral  QHS  . gabapentin  900 mg Oral BID  . insulin glargine  10 Units Subcutaneous BID  . insulin lispro  4-18 Units Subcutaneous Q4H  . methimazole  10 mg Oral Daily  . pantoprazole  40 mg Oral BID  . sodium chloride flush  3 mL Intravenous Q12H   Continuous Infusions: . sodium chloride Stopped (05/15/18 1725)  . amiodarone 30 mg/hr (05/18/18 1200)  . cefTRIAXone (ROCEPHIN)  IV Stopped (05/17/18 1919)     LOS: 4 days   Time spent: 35 minutes.  Patrecia Pour, MD [facility] [email or web address] Password [email or web address] 05/18/2018, 12:37 PM

## 2018-05-18 NOTE — Plan of Care (Signed)

## 2018-05-18 NOTE — Care Management Note (Signed)
Case Management Note  Patient Details  Name: Judith [name] MRN: [id number] Date of Birth: [birth year]  Subjective/Objective: 68 yo female presented after an unresponsive episode. PMH: right transtibial amputation, chronic kidney disease, CAD, diabetes mellitus with peripheral neuropathy and paroxysmal atrial fibrillation.                  Action/Plan: CM met with patient to discuss her dispositional needs. Patient reported living at home with his sister since transitioning home from [facility], with assistance with her ADLs being provided by his sister. DME: WC, BSC, RW, Grab bars. PCP verified as: Grant Fontana; pharmacy of choice: [place]. Patient was open to [facility] for [facility] services PTA and has been recommended by PT/OT with 24hr/assist. CMS [facility] Compare list provided to patient, with patient requesting to continue with [facility]. [place] referral discussed with Tiffany RN, [facility] liaison; AVS updated for HHRN/PT/OT/aide services. Patients family will provide transportation home. No further needs from CM.   Expected Discharge Date:                  Expected Discharge Plan:  [place]  In-House Referral:  NA  Discharge planning Services  CM Consult  Post Acute Care Choice:  Home Health, Resumption of Svcs/PTA Provider Choice offered to:  Patient  DME Arranged:  N/A DME Agency:  NA  HH Arranged:  RN, Disease Management, PT, OT, Nurse's Aide [place] Agency:  [facility] (now [facility])  Status of Service:  In process, will continue to follow  If discussed at Long Length of Stay Meetings, dates discussed:    Additional Comments:  Midge Minium RN, BSN, NCM-BC, ACM-RN [phone] 05/18/2018, 1:25 PM

## 2018-05-18 NOTE — Progress Notes (Signed)
CRITICAL VALUE ALERT  Critical Value: EKG reports acute MI/STEMI  Date & Time Notied: 05/18/18 1000   Provider Notified: Dr. Lovena Le  Orders Received/Actions taken: MD at bedside to assess EKG, reports false findings. MD signed EKG with changes made.

## 2018-05-18 NOTE — Progress Notes (Addendum)
Progress Note  Patient Name: Judith [name] Date of Encounter: 05/18/2018  Primary Cardiologist: Fransico Him, MD   Subjective   No complaints, spent a couple hours out of bed this AM, denies any CP or SOB  Inpatient Medications    Scheduled Meds: . amLODipine  7.5 mg Oral Daily  . apixaban  5 mg Oral BID  . atorvastatin  20 mg Oral q1800  . buPROPion  150 mg Oral BID  . FLUoxetine  20 mg Oral QHS  . gabapentin  900 mg Oral BID  . insulin glargine  10 Units Subcutaneous BID  . insulin lispro  4-18 Units Subcutaneous Q4H  . methimazole  10 mg Oral Daily  . pantoprazole  40 mg Oral BID  . sodium chloride flush  3 mL Intravenous Q12H   Continuous Infusions: . sodium chloride Stopped (05/15/18 1725)  . amiodarone 30 mg/hr (05/18/18 1000)  . cefTRIAXone (ROCEPHIN)  IV Stopped (05/17/18 1919)   PRN Meds: sodium chloride, HYDROcodone-acetaminophen, levalbuterol, ondansetron **OR** ondansetron (ZOFRAN) IV, sodium chloride flush   Vital Signs    Vitals:   05/18/18 0800 05/18/18 0828 05/18/18 0900 05/18/18 1000  BP: 114/88  135/90 126/83  Pulse:    (!) 133  Resp: _0 Temp:  98 F (36.7 C)    TempSrc:  Oral    SpO2:    97%    Intake/Output Summary (Last 24 hours) at 05/18/2018 1108 Last data filed at 05/18/2018 1000 Gross per 24 hour  Intake 1769.75 ml  Output 1900 ml  Net -130.25 ml   There were no vitals filed for this visit.  Telemetry    AFlutter, 120's-130's - Personally Reviewed  ECG    AFlutter 135 - Personally Reviewed with Dr. Lovena Le  Physical Exam   GEN: No acute distress, looks older then her age, chronically ill in appearance.   Neck: No JVD Cardiac: tachycardic, no murmurs, rubs, or gallops.  Respiratory: CTA b/l. GI: Soft, nontender, non-distended  MS: No edema LLE s/p R BKA. Neuro:  Nonfocal  Psych: Normal affect   Labs    Chemistry Recent Labs  Lab 05/14/18 1052  05/16/18 0346 05/17/18 0356 05/17/18 1134 05/18/18 0420  NA  133*   < > 135 135  --  133*  K 3.4*   < > 3.0* 3.7 4.8 4.4  CL 97*   < > 102 104  --  103  CO2 13*   < > 21* 22  --  21*  GLUCOSE 337*   < > 159* 131*  --  264*  BUN 24*   < > 24* 23  --  20  CREATININE 1.39*   < > 1.39* 1.16*  --  1.14*  CALCIUM 7.8*   < > 8.3* 9.6  --  10.0  PROT 8.6*  --   --   --   --   --   ALBUMIN 3.6  --   --   --   --   --   AST 32  --   --   --   --   --   ALT 14  --   --   --   --   --   ALKPHOS 57  --   --   --   --   --   BILITOT 0.6  --   --   --   --   --   GFRNONAA 40*   < > 40* 49*  --  50*  GFRAA 46*   < > 46* 57*  --  58*  ANIONGAP 23*   < > 12 9  --  9   < > = values in this interval not displayed.     Hematology Recent Labs  Lab 05/14/18 1052 05/15/18 0215  WBC 12.6* 13.5*  RBC 5.16* 4.45  HGB 13.9 12.5  HCT 45.5 38.0  MCV 88.2 85.4  MCH 26.9 28.1  MCHC 30.5 32.9  RDW 13.5 13.9  PLT 624* 474*    Cardiac Enzymes Recent Labs  Lab 05/14/18 1446 05/14/18 2046 05/15/18 0215 05/15/18 1755  TROPONINI 0.07* 0.20* 0.68* 1.16*    Recent Labs  Lab 05/14/18 1050  TROPIPOC 0.02     BNPNo results for input(s): BNP, PROBNP in the last 168 hours.   DDimer No results for input(s): DDIMER in the last 168 hours.   Radiology      Cardiac Studies   Echo is completed, pending read  01/03/18: TTE Study Conclusions - Procedure narrative: Transthoracic echocardiography. Image quality was adequate. The study was technically difficult. - Left ventricle: The cavity size was normal. Wall thickness was increased in a pattern of mild LVH. Systolic function was normal. The estimated ejection fraction was in the range of 55% to 60%. Possible mild anterolateral hypokinesis. Features are consistent with a pseudonormal left ventricular filling pattern, with concomitant abnormal relaxation and increased filling pressure (grade 2 diastolic dysfunction). - Aortic valve: Trileaflet; moderately calcified leaflets. There was  moderate stenosis. Mean gradient (S): 19 mm Hg. Peak gradient (S): 30 mm Hg. Valve area (VTI): 1.16 cm^2. - Mitral valve: Moderately calcified annulus. There was mild regurgitation. - Left atrium: The atrium was mildly dilated. - Right ventricle: The cavity size was normal. Systolic function was normal. - Pulmonary arteries: No complete TR doppler jet so unable to estimate PA systolic pressure. - Systemic veins: IVC measured 2.6 cm with < 50% respirophasic variation, suggesting RA pressure 15 mmHg. - Pericardium, extracardiac: A trivial pericardial effusion was identified. Impressions - Normal LV size with mild LV hypertrophy. EF 55-60% with possible mild anterolateral hypokinesis. Moderate diastolic dysfunction. Normal RV size and systolic function. Mild MR. Moderate aortic stenosis. Dilated IVC suggestive of elevated RV filling pressure.   03/06/15: LHC/PCI Left Anterior Descending  Prox LAD to Mid LAD lesion 40% stenosed  calcified diffuse.  Left Circumflex  Mid Cx lesion 80% stenosed  calcified.  Second Obtuse Marginal Branch  Ost 2nd Mrg to 2nd Mrg lesion 70% stenosed  calcified diffuse.  Right Coronary Artery  Collaterals  Dist RCA filled by collaterals from Dist LAD.    Prox RCA lesion 100% stenosed  calcified diffuse.  Intervention   Mid Cx lesion  Angioplasty  Pre-stent angioplasty was performed. A drug-eluting stent was placed. A post-stent angioplasty was not performed. Maximum pressure: 16 atm. The pre-interventional distal flow is normal (TIMI 3). The post-interventional distal flow is normal (TIMI 3). The intervention was successful. No complications occured at this lesion. IC nitroglycerin was given. The mid circumflex/obtuse marginal bifurcation is treated. There is complex bifurcation stenosis present. I planned on using a bifurcation stenting technique. Angiomax is used for anticoagulation. The patient is adequately preloaded with  aspirin and Plavix. A 7 French XB 3.5 cm guide catheter was utilized. Once a therapeutic ACT was achieved, a cougar guidewire is advanced into the OM. A second cougar guidewire is advanced into the distal circumflex. The obtuse marginal branches predilated with a 2.5 x 20 mm noncompliant balloon.  The circumflex is then predilated with a 3.0 mm balloon. With the balloon present in the circumflex, a 2.5 x 20 mm Promus DES his advanced into the OM branch. The stent is deployed so that the ostium is covered. Simultaneously, the circumflex balloon is dilated in order to keep the circumflex portion patent. The circumflex stent is then advanced so that it lays across the OM origin. A 3.5 x 12 mm Promus DES was chosen. This stent is deployed at 14 atm. I tried to rewire the OM branch but was unsuccessful. I decided to post dilate the circumflex stent in order to open the cells better. A 4.0 mm noncompliant balloon is chosen. It is dilated to 16 atm. I was then able to wire the obtuse marginal branch with a whisper wire. The OM stent is postdilated with a 2.5 mm noncompliant balloon. A final kissing balloon is done with the 4.0 mm balloon and the circumflex and the 2.5 mm balloon in the obtuse marginal. At the completion of the procedure there is 0% residual stenosis in the circumflex and 20% residual stenosis at the obtuse marginal origin. There is TIMI-3 flow into both vessels.  There is a 0% residual stenosis post intervention.  Ost 2nd Mrg to 2nd Mrg lesion  Angioplasty  Pre-stent angioplasty was performed. A drug-eluting stent was placed. A post-stent angioplasty was not performed. The pre-interventional distal flow is normal (TIMI 3). The post-interventional distal flow is normal (TIMI 3). The intervention was successful. No complications occured at this lesion. IC nitroglycerin was given. See description under LCx lesion, bifurcation stenting technique utilized  There is a 20% residual stenosis post intervention.       Patient Profile     68 y.o. 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  Assessment & Plan     1. Seizure vs syncope 2. WCT     This only occured when in AFib/flutter, often was irregular (some more regular)      Felt to represent BBB/Ashman's, given only occurred when in AFib, none with SR      Continue correction of electrolytes, keep Mag >2.0 and K+ better then 4.0 (medicine team addressing)      No sotalol going forward      Continue amiodarone   3. Persistent AFib, flutter     CHA2DS2Vasc is 5, on Eliquis has been resumed appropriately dosed     she is in AFlutter 120's-130's this AM, some intermittent/brief slowing notes flutter waves        Continue amiodarone gtt (restarted yesterday)     She is largely asymptomatic from this, BP is ok       4. CAD     Mild abnormal Trop, no ischemic EKG changes appreciated, likely demand     She has not had any cardiac awareness of late, no CP of any kind     Remains without complaints     No active complaints of any cardiac sounding etiology           For questions or updates, please contact [facility] Please consult [email or web address] for contact info under        Signed, Joseph Art  Dyane Dustman, PA-C  05/18/2018, 11:08 AM    EP attending  Patient seen and examined.  Agree with the findings as noted above.  The patient has reverted to atrial flutter from atrial fibrillation on intravenous amiodarone.  She appears to be tolerating this very nicely.  She is essentially asymptomatic and denies chest pain or shortness of breath.  Her exam demonstrates a regular tachycardia.  Her lungs are clear.  Her extremity is warm.  She is status  post right BKA.  For now I would recommend continued intravenous amiodarone.  Hopefully her atrial flutter will self terminate.  When she reverts to sinus rhythm, we will transition her to 200 mg 2 times daily of amiodarone.  Cristopher Peru, MD

## 2018-05-18 NOTE — Progress Notes (Signed)
EKG CRITICAL VALUE     12 lead EKG performed.  Critical value noted. Dario Guardian, RN notified.   Add Dinapoli, CCT 05/18/2018 10:49 AM

## 2018-05-19 LAB — MAGNESIUM: Magnesium: 1.7 mg/dL (ref 1.7–2.4)

## 2018-05-19 LAB — BASIC METABOLIC PANEL
Anion gap: 10 (ref 5–15)
BUN: 16 mg/dL (ref 8–23)
CALCIUM: 9.8 mg/dL (ref 8.9–10.3)
CO2: 22 mmol/L (ref 22–32)
Chloride: 104 mmol/L (ref 98–111)
Creatinine, Ser: 1.07 mg/dL — ABNORMAL HIGH (ref 0.44–1.00)
GFR calc Af Amer: >60 mL/min (ref >60)
GFR calc non Af Amer: 54 mL/min — ABNORMAL LOW (ref >60)
Glucose, Bld: 129 mg/dL — ABNORMAL HIGH (ref 70–99)
Potassium: 5 mmol/L (ref 3.5–5.1)
Sodium: 136 mmol/L (ref 135–145)

## 2018-05-19 LAB — GLUCOSE, CAPILLARY
Glucose-Capillary: 144 mg/dL — ABNORMAL HIGH (ref 70–99)
Glucose-Capillary: 293 mg/dL — ABNORMAL HIGH (ref 70–99)
Glucose-Capillary: 119 mg/dL — ABNORMAL HIGH (ref 70–99)
Glucose-Capillary: 218 mg/dL — ABNORMAL HIGH (ref 70–99)

## 2018-05-19 MED ORDER — DILTIAZEM HCL ER COATED BEADS 120 MG PO CP24
120.0000 mg | ORAL_CAPSULE | Freq: Every day | ORAL | Status: DC
  Administered 2018-05-19 – 2018-05-23 (×5): 120 mg via ORAL
  Filled 2018-05-19 (×5): qty 1

## 2018-05-19 MED ORDER — INSULIN LISPRO (1 UNIT DIAL) 100 UNIT/ML (KWIKPEN)
4.0000 [IU] | PEN_INJECTOR | Freq: Three times a day (TID) | SUBCUTANEOUS | Status: DC
  Administered 2018-05-20: 10 [IU] via SUBCUTANEOUS
  Administered 2018-05-20: 6 [IU] via SUBCUTANEOUS
  Administered 2018-05-20: 8 [IU] via SUBCUTANEOUS
  Administered 2018-05-21: 6 [IU] via SUBCUTANEOUS
  Administered 2018-05-21: 12 [IU] via SUBCUTANEOUS
  Administered 2018-05-21 – 2018-05-23 (×2): 6 [IU] via SUBCUTANEOUS
  Filled 2018-05-19 (×2): qty 3

## 2018-05-19 MED ORDER — MAGNESIUM SULFATE 2 GM/50ML IV SOLN
2.0000 g | Freq: Once | INTRAVENOUS | Status: AC
  Administered 2018-05-19: 2 g via INTRAVENOUS
  Filled 2018-05-19: qty 50

## 2018-05-19 NOTE — Progress Notes (Signed)
PROGRESS NOTE  Judith [name]  ZOX:[id number] DOB: 1950-10-23 DOA: 05/14/2018 PCP: Rutherford Guys, MD  Brief Narrative: Judith [name] is a 68 y.o. female with a history of AFib, VT, stage III CKD, recent right BKA, and T2DM who presented to the ED with an unresponsive episode. She is living with her sister since undergoing right BKA recently and has been failing to thrive generally, eating less, getting up less, but on 1/5 noted palpitations typical of previous episodes of AFib with RVR. Her sister drove her to the ED when en route the patient had 2-3 minutes of tensing bilateral arms, being unresponsive with eyes rolled in the back of her head, maintaining postural tone in the seat, then began moving all limbs, bumping the BKA stump against the dashboard which would normally cause excruciating pain though didn't seem to cause pain. On arrival she was found to have a wide complex tachycardia, given amiodarone bolus and metoprolol with conversion to narrow AFib with RVR. Mental status had improved when the patient had another episode lasting about 3 minutes terminated with ativan 2mg  IV, postictal-appearing thereafter. Mental status has waxed and waned consistent with delirium, but has largely returned to baseline. Nausea has resolved and she's eating a normal diet, has had no further seizure-like activity. Heart rhythm converted to NSR overnight into 05/15/2018, though with transition to po amiodarone 1/8, went back into AFib/aflutter prompting restart of amiodarone infusion.  Assessment & Plan: Principal Problem:   V-tach [facility]) Active Problems:   DM (diabetes mellitus), type 2 with peripheral vascular complications (HCC)   HTN (hypertension)   Memory loss   Atrial fibrillation with RVR (HCC)   Chronic combined systolic and diastolic congestive heart failure (HCC)   CKD (chronic kidney disease), stage III (HCC)   Seizure (HCC)  Acute metabolic encephalopathy: Due to postictal state, possibly  related to arrhythmia, also likely an element of acute delirium as she's struggled with that in the hospital in the past. Also has had subacute decline in mood, appetite, etc. following BKA, so suspect depression also contributing on longer time scale.  - Delirium precautions - Will hold sedating medications as able. Do not want to precipitate gabapentin withdrawal, so restarted this at previous dose. No longer sedated, so will not further lower dose. - Treat UTI as below  UTI: Pyuria on UA. Also w/leukocytosis. Unclear whether this is contributing to AMS and/or arrhythmia. Serratia on urine culture. No leukocytosis.  - Started CTX, will continue for now. - Monitor urine culture susceptibilities.    Wide complex tachycardia, possibly ventricular tachycardia, though cardiology favors persistent AFib with RVR, AFlutter: Echocardiogram with EF 60-65%, G1DD, no regional WMA. mild-mod AS. - Continue cardiac monitoring. Personally reviewed today, my interpretation is rapid AFlutter typically in 130bpms.  - Appreciate cardiology/EP assistance. Continue amio gtt, if not able to convert, may require cardioversion. Continues to be not very symptomatic at this time. CHA2DS2-VASc score is 5, restarted eliquis. Adding dilt, not planning to restart sotalol. - Recurrent hypomagnesemia: Given 2g this AM. Will recheck K, Mg in AM. K rising but not being directly replaced for a few days. Creatinine not indicative of AKI.   Troponin elevation: Suspect demand ischemia in setting of arrhythmia, also suspected to be etiology of lactic acid elevation. Lactate has cleared, troponin still with elevation as expected with CAD and significant demand. No intervention planned per cardiology. ECG 1/9 read as STEMI, false positive per cardiology. No chest pain.  New onset seizures/seizure-like activity: No intracerebral abnormalities  on imaging, EEG the following day is interpreted as a normal EEG in sleeping and awake states.  -  Stopped tramadol.  - Since it is first episode and in setting of dehydration, arrhythmia, and EEG normal will not start AED at this time.  - prn ativan  Nausea: Waxing/waning PTA. Has been evaluated by GI as outpatient.  - Antiemetics prn, tolerating diet, advance as tolerated.  Osteoarthritis of left shoulder, chronic pain, s/p right BKA: Normal wound healing. - Seems to be improved on hydrocodone prn with good tolerance. - Orthopedic follow up as scheduled.   IDT2DM: Last HBA1c 8%. Recently increasing CBGs with improved po intake. Alertnative/additional explanation would be undiscovered infection. - Added AM dose of long-acting insulin, 10u BID. AM CBG at goal. Still peaking w/meals with better appetite. Will again  increase humalog scale.   Stage III CKD:  - Monitor, avoid nephrotoxins.   HTN:  - Continue norvasc.   OSA:  - Continue CPAP qHS  Hyperthyroidism:  - Continue tapazole. TSH wnl.  Depression with adjustment disorder:  - Continue home medications - Needs outpatient therapy  DVT prophylaxis: Eliquis. Code Status: Full Family Communication: None at bedside Disposition Plan: Uncertain. Remain in 2H due to uncontrolled heart rate/rhythm. Consultants:   Cardiology/EP  Procedures:   Echocardiogram 05/16/2018: - Left ventricle: The cavity size was normal. Wall thickness was   increased in a pattern of severe LVH. Systolic function was   normal. The estimated ejection fraction was in the range of 60%   to 65%. Wall motion was normal; there were no regional wall   motion abnormalities. There was an increased relative   contribution of atrial contraction to ventricular filling.   Doppler parameters are consistent with abnormal left ventricular   relaxation (grade 1 diastolic dysfunction). Doppler parameters   are consistent with high ventricular filling pressure. - Aortic valve: Valve mobility was restricted. There was mild to   moderate stenosis. Mean gradient (S):  17 mm Hg. VTI ratio of LVOT   to aortic valve: 0.36. Valve area (VTI): 1.37 cm^2. Valve area   (Vmax): 1.22 cm^2. Valve area (Vmean): 1.27 cm^2. - Mitral valve: Calcified annulus. Valve area by pressure   half-time: 2.39 cm^2. - Left atrium: The atrium was moderately dilated. - Pulmonary arteries: Systolic pressure could not be accurately   estimated.  Antimicrobials:  Ceftriaxone 1/8 >>   Subjective: She has no abdominal pain, N/V/D. Pain in shoulders and right stump controlled. No dyspnea or chest pain subjectively but with exertion her HR goes very high and she gets more winded than usual prompting nursing to use bedpan for toileting.  Objective: Vitals:   05/19/18 1000 05/19/18 1100 05/19/18 1138 05/19/18 1200  BP: 132/84 122/77  (!) 130/95  Pulse: (!) 129 (!) 132  (!) 128  Resp: (!) 26 13  (!) 24  Temp:   97.9 F (36.6 C)   TempSrc:   Oral   SpO2: 100% 99%  97%    Intake/Output Summary (Last 24 hours) at 05/19/2018 1435 Last data filed at 05/19/2018 0700 Gross per 24 hour  Intake 676.4 ml  Output 1700 ml  Net -1023.6 ml   Gen: 68 y.o. female in no distress, appearing older than stated age. Pulm: Nonlabored breathing room air. Clear. CV: Regular tachycardia, narrow complexes on monitor. No murmur, rub, or gallop. No JVD, no dependent edema. GI: Abdomen soft, non-tender, non-distended, with normoactive bowel sounds.  Ext: Warm, Right BKA.  Skin: No new rashes, lesions or  ulcers on visualized skin. Neuro: Alert and oriented. No focal neurological deficits. Psych: Judgement and insight appear fair. Mood euthymic & affect congruent. Behavior is appropriate.    Data Reviewed: I have personally reviewed following labs and imaging studies  CBC: Recent Labs  Lab 05/14/18 1052 05/15/18 0215 05/18/18 1637  WBC 12.6* 13.5* 9.2  NEUTROABS 8.3*  --   --   HGB 13.9 12.5 11.3*  HCT 45.5 38.0 36.0  MCV 88.2 85.4 86.5  PLT 624* 474* 387   Basic Metabolic Panel: Recent  Labs  Lab 05/16/18 0346 05/17/18 0356 05/17/18 1134 05/18/18 0420 05/18/18 1637 05/19/18 0549  NA 135 135  --  133* 134* 136  K 3.0* 3.7 4.8 4.4 4.8 5.0  CL 102 104  --  103 104 104  CO2 21* 22  --  21* 21* 22  GLUCOSE 159* 131*  --  264* 160* 129*  BUN 24* 23  --  20 17 16   CREATININE 1.39* 1.16*  --  1.14* 1.03* 1.07*  CALCIUM 8.3* 9.6  --  10.0 10.1 9.8  MG 2.3 1.8 2.2 1.5* 1.9 1.7   GFR: CrCl cannot be calculated (Unknown ideal weight.). Liver Function Tests: Recent Labs  Lab 05/14/18 1052  AST 32  ALT 14  ALKPHOS 57  BILITOT 0.6  PROT 8.6*  ALBUMIN 3.6   Recent Labs  Lab 05/14/18 1052  LIPASE 35   No results for input(s): AMMONIA in the last 168 hours. Coagulation Profile: No results for input(s): INR, PROTIME in the last 168 hours. Cardiac Enzymes: Recent Labs  Lab 05/14/18 1446 05/14/18 2046 05/15/18 0215 05/15/18 1755  TROPONINI 0.07* 0.20* 0.68* 1.16*   BNP (last 3 results) No results for input(s): PROBNP in the last 8760 hours. HbA1C: No results for input(s): HGBA1C in the last 72 hours. CBG: Recent Labs  Lab 05/18/18 1113 05/18/18 1609 05/18/18 2207 05/19/18 0658 05/19/18 1142  GLUCAP 233* 171* 259* 144* 293*   Lipid Profile: No results for input(s): CHOL, HDL, LDLCALC, TRIG, CHOLHDL, LDLDIRECT in the last 72 hours. Thyroid Function Tests: No results for input(s): TSH, T4TOTAL, FREET4, T3FREE, THYROIDAB in the last 72 hours. Anemia Panel: No results for input(s): VITAMINB12, FOLATE, FERRITIN, TIBC, IRON, RETICCTPCT in the last 72 hours. Urine analysis:    Component Value Date/Time   COLORURINE YELLOW 05/14/2018 1119   APPEARANCEUR CLOUDY (A) 05/14/2018 1119   LABSPEC 1.017 05/14/2018 1119   PHURINE 5.0 05/14/2018 1119   GLUCOSEU >=500 (A) 05/14/2018 1119   HGBUR SMALL (A) 05/14/2018 1119   BILIRUBINUR NEGATIVE 05/14/2018 1119   BILIRUBINUR negative 07/22/2017 0858   BILIRUBINUR Small 12/06/2014 1441   KETONESUR NEGATIVE  05/14/2018 1119   PROTEINUR 100 (A) 05/14/2018 1119   UROBILINOGEN 0.2 07/22/2017 0858   UROBILINOGEN 1.0 02/12/2015 1238   NITRITE NEGATIVE 05/14/2018 1119   LEUKOCYTESUR LARGE (A) 05/14/2018 1119   Recent Results (from the past 240 hour(s))  Culture, Urine     Status: Abnormal (Preliminary result)   Collection Time: 05/18/18  3:04 AM  Result Value Ref Range Status   Specimen Description URINE, CLEAN CATCH  Final   Special Requests   Final    NONE Performed at [facility], [place] [address], [place], [place] [id number]    Culture 20,000 COLONIES/mL SERRATIA MARCESCENS (A)  Final   Report Status PENDING  Incomplete      Radiology Studies: No results found.  Scheduled Meds: . apixaban  5 mg Oral BID  .  atorvastatin  20 mg Oral q1800  . buPROPion  150 mg Oral BID  . diltiazem  120 mg Oral Daily  . FLUoxetine  20 mg Oral QHS  . gabapentin  900 mg Oral BID  . insulin glargine  10 Units Subcutaneous BID  . insulin lispro  4-18 Units Subcutaneous TID AC & HS  . methimazole  10 mg Oral Daily  . pantoprazole  40 mg Oral BID  . sodium chloride flush  3 mL Intravenous Q12H   Continuous Infusions: . sodium chloride Stopped (05/15/18 1725)  . amiodarone 30 mg/hr (05/19/18 1355)  . cefTRIAXone (ROCEPHIN)  IV Stopped (05/18/18 1723)     LOS: 5 days   Time spent: 35 minutes.  Patrecia Pour, MD [facility] [email or web address] Password [email or web address] 05/19/2018, 2:35 PM

## 2018-05-19 NOTE — Progress Notes (Signed)
Pt places self on home cpap unit. RT will continue to monitor as needed.

## 2018-05-19 NOTE — Progress Notes (Signed)
Inpatient Diabetes Program Recommendations  AACE/ADA: New Consensus Statement on Inpatient Glycemic Control (2015)  Target Ranges:  Prepandial:   less than 140 mg/dL      Peak postprandial:   less than 180 mg/dL (1-2 hours)      Critically ill patients:  140 - 180 mg/dL   Lab Results  Component Value Date   GLUCAP 293 (H) 05/19/2018   HGBA1C 8.0 (H) 04/14/2018    Review of Glycemic ControlResults for DORLEEN, KISSEL (MRN [id number]) as of 05/19/2018 14:34  Ref. Range 05/18/2018 08:03 05/18/2018 11:13 05/18/2018 16:09 05/18/2018 22:07 05/19/2018 06:58 05/19/2018 11:42  Glucose-Capillary Latest Ref Range: 70 - 99 mg/dL 171 (H)  6 units Humalog 233 (H)  8 units Humalog 171 (H)  6 units Humalog 259 (H)  12 units  Humalog  144 (H)  4 units Humalog  293 (H)  8 units Humalog    Diabetes history: DM 2 Outpatient Diabetes medications: Tresiba 20 units q PM Current orders for Inpatient glycemic control:  Lantus 10 units bid, Humalog - CBG 121 - 150: 4 units  CBG 151 - 200: 6 units  CBG 201 - 250: 8 units  CBG 251 - 300: 12 units  CBG 301 - 350: 15 units  CBG > 350: 18 units Inpatient Diabetes Program Recommendations:    -Consider reducing Humalog correction to CBG 121-150 mg/dL- 2 units, 151-200 mg/dL- 4 units, 201-250 mg/dL-6 units, 251-300 mg/dL- 8 units, 3001-350 mg/dL- 10 units, >350 mg/dL 14 units  -Change HS scale to start at 201-250 mg/dL- 2 units, 251-300- 4 units, 301-350 mg/dL- 6 units, >351 mg/dL- 8 units  -Add Humalog meal coverage 4 units tid with meals  Thanks,  Adah Perl, RN, BC-ADM Inpatient Diabetes Coordinator Pager [phone] (8a-5p)

## 2018-05-19 NOTE — Progress Notes (Addendum)
Progress Note  Patient Name: Judith [name] Date of Encounter: 05/19/2018  Primary Cardiologist: Fransico Him, MD   Subjective   She is OOB to chair eating breakfast, no complaints, no CP or SOB  Inpatient Medications    Scheduled Meds: . amLODipine  7.5 mg Oral Daily  . apixaban  5 mg Oral BID  . atorvastatin  20 mg Oral q1800  . buPROPion  150 mg Oral BID  . FLUoxetine  20 mg Oral QHS  . gabapentin  900 mg Oral BID  . insulin glargine  10 Units Subcutaneous BID  . insulin lispro  4-18 Units Subcutaneous TID AC & HS  . methimazole  10 mg Oral Daily  . pantoprazole  40 mg Oral BID  . sodium chloride flush  3 mL Intravenous Q12H   Continuous Infusions: . sodium chloride Stopped (05/15/18 1725)  . amiodarone 30 mg/hr (05/19/18 0700)  . cefTRIAXone (ROCEPHIN)  IV Stopped (05/18/18 1723)  . magnesium sulfate 1 - 4 g bolus IVPB 2 g (05/19/18 0756)   PRN Meds: sodium chloride, HYDROcodone-acetaminophen, levalbuterol, ondansetron **OR** ondansetron (ZOFRAN) IV, sodium chloride flush   Vital Signs    Vitals:   05/19/18 0500 05/19/18 0600 05/19/18 0700 05/19/18 0800  BP: 124/86 128/77 110/80 97/77  Pulse: (!) 135 (!) 132 (!) 136 (!) 135  Resp: _0 Temp:   98.3 F (36.8 C)   TempSrc:   Oral   SpO2: 97% 97% 100% 97%    Intake/Output Summary (Last 24 hours) at 05/19/2018 7829 Last data filed at 05/19/2018 0700 Gross per 24 hour  Intake 1065.82 ml  Output 2300 ml  Net -1234.18 ml   There were no vitals filed for this visit.  Telemetry    AFlutter, 120's-130's - Personally Reviewed  ECG    No new EKGs - Personally Reviewed   Physical Exam   GEN: No acute distress, looks older then her age, chronically ill in appearance.   Neck: No JVD Cardiac: tachycardic, no murmurs, rubs, or gallops.  Respiratory: CTA b/l. GI: Soft, nontender, non-distended  MS: No edema LLE s/p R BKA. Neuro:  Nonfocal  Psych: Normal affect   Labs    Chemistry Recent Labs    Lab 05/14/18 1052  05/18/18 0420 05/18/18 1637 05/19/18 0549  NA 133*   < > 133* 134* 136  K 3.4*   < > 4.4 4.8 5.0  CL 97*   < > 103 104 104  CO2 13*   < > 21* 21* 22  GLUCOSE 337*   < > 264* 160* 129*  BUN 24*   < > _1 CREATININE 1.39*   < > 1.14* 1.03* 1.07*  CALCIUM 7.8*   < > 10.0 10.1 9.8  PROT 8.6*  --   --   --   --   ALBUMIN 3.6  --   --   --   --   AST 32  --   --   --   --   ALT 14  --   --   --   --   ALKPHOS 57  --   --   --   --   BILITOT 0.6  --   --   --   --   GFRNONAA 40*   < > 50* 57* 54*  GFRAA 46*   < > 58* >60 >60  ANIONGAP 23*   < > _2 < > =  values in this interval not displayed.     Hematology Recent Labs  Lab 05/14/18 1052 05/15/18 0215 05/18/18 1637  WBC 12.6* 13.5* 9.2  RBC 5.16* 4.45 4.16  HGB 13.9 12.5 11.3*  HCT 45.5 38.0 36.0  MCV 88.2 85.4 86.5  MCH 26.9 28.1 27.2  MCHC 30.5 32.9 31.4  RDW 13.5 13.9 13.7  PLT 624* 474* 364    Cardiac Enzymes Recent Labs  Lab 05/14/18 1446 05/14/18 2046 05/15/18 0215 05/15/18 1755  TROPONINI 0.07* 0.20* 0.68* 1.16*    Recent Labs  Lab 05/14/18 1050  TROPIPOC 0.02     BNPNo results for input(s): BNP, PROBNP in the last 168 hours.   DDimer No results for input(s): DDIMER in the last 168 hours.   Radiology      Cardiac Studies   Echo is completed, pending read  01/03/18: TTE Study Conclusions - Procedure narrative: Transthoracic echocardiography. Image quality was adequate. The study was technically difficult. - Left ventricle: The cavity size was normal. Wall thickness was increased in a pattern of mild LVH. Systolic function was normal. The estimated ejection fraction was in the range of 55% to 60%. Possible mild anterolateral hypokinesis. Features are consistent with a pseudonormal left ventricular filling pattern, with concomitant abnormal relaxation and increased filling pressure (grade 2 diastolic dysfunction). - Aortic valve: Trileaflet;  moderately calcified leaflets. There was moderate stenosis. Mean gradient (S): 19 mm Hg. Peak gradient (S): 30 mm Hg. Valve area (VTI): 1.16 cm^2. - Mitral valve: Moderately calcified annulus. There was mild regurgitation. - Left atrium: The atrium was mildly dilated. - Right ventricle: The cavity size was normal. Systolic function was normal. - Pulmonary arteries: No complete TR doppler jet so unable to estimate PA systolic pressure. - Systemic veins: IVC measured 2.6 cm with < 50% respirophasic variation, suggesting RA pressure 15 mmHg. - Pericardium, extracardiac: A trivial pericardial effusion was identified. Impressions - Normal LV size with mild LV hypertrophy. EF 55-60% with possible mild anterolateral hypokinesis. Moderate diastolic dysfunction. Normal RV size and systolic function. Mild MR. Moderate aortic stenosis. Dilated IVC suggestive of elevated RV filling pressure.   03/06/15: LHC/PCI Left Anterior Descending  Prox LAD to Mid LAD lesion 40% stenosed  calcified diffuse.  Left Circumflex  Mid Cx lesion 80% stenosed  calcified.  Second Obtuse Marginal Branch  Ost 2nd Mrg to 2nd Mrg lesion 70% stenosed  calcified diffuse.  Right Coronary Artery  Collaterals  Dist RCA filled by collaterals from Dist LAD.    Prox RCA lesion 100% stenosed  calcified diffuse.  Intervention   Mid Cx lesion  Angioplasty  Pre-stent angioplasty was performed. A drug-eluting stent was placed. A post-stent angioplasty was not performed. Maximum pressure: 16 atm. The pre-interventional distal flow is normal (TIMI 3). The post-interventional distal flow is normal (TIMI 3). The intervention was successful. No complications occured at this lesion. IC nitroglycerin was given. The mid circumflex/obtuse marginal bifurcation is treated. There is complex bifurcation stenosis present. I planned on using a bifurcation stenting technique. Angiomax is used for anticoagulation. The  patient is adequately preloaded with aspirin and Plavix. A 7 French XB 3.5 cm guide catheter was utilized. Once a therapeutic ACT was achieved, a cougar guidewire is advanced into the OM. A second cougar guidewire is advanced into the distal circumflex. The obtuse marginal branches predilated with a 2.5 x 20 mm noncompliant balloon. The circumflex is then predilated with a 3.0 mm balloon. With the balloon present in the circumflex, a 2.5 x 20  mm Promus DES his advanced into the OM branch. The stent is deployed so that the ostium is covered. Simultaneously, the circumflex balloon is dilated in order to keep the circumflex portion patent. The circumflex stent is then advanced so that it lays across the OM origin. A 3.5 x 12 mm Promus DES was chosen. This stent is deployed at 14 atm. I tried to rewire the OM branch but was unsuccessful. I decided to post dilate the circumflex stent in order to open the cells better. A 4.0 mm noncompliant balloon is chosen. It is dilated to 16 atm. I was then able to wire the obtuse marginal branch with a whisper wire. The OM stent is postdilated with a 2.5 mm noncompliant balloon. A final kissing balloon is done with the 4.0 mm balloon and the circumflex and the 2.5 mm balloon in the obtuse marginal. At the completion of the procedure there is 0% residual stenosis in the circumflex and 20% residual stenosis at the obtuse marginal origin. There is TIMI-3 flow into both vessels.  There is a 0% residual stenosis post intervention.  Ost 2nd Mrg to 2nd Mrg lesion  Angioplasty  Pre-stent angioplasty was performed. A drug-eluting stent was placed. A post-stent angioplasty was not performed. The pre-interventional distal flow is normal (TIMI 3). The post-interventional distal flow is normal (TIMI 3). The intervention was successful. No complications occured at this lesion. IC nitroglycerin was given. See description under LCx lesion, bifurcation stenting technique utilized  There is a 20%  residual stenosis post intervention.      Patient Profile     69 y.o. 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  Assessment & Plan     1. Seizure vs syncope 2. WCT     This only occured when in AFib/flutter, often was irregular (some more regular)      Felt to represent BBB/Ashman's, given only occurred when in AFib, none with SR      Continue correction of electrolytes, keep Mag >2.0 and K+ better then 4.0 (medicine team addressing)      No sotalol going forward      Continue amiodarone   3. Persistent AFib, has been in A flutter now a couple days     CHA2DS2Vasc is 5, on Eliquis has been resumed appropriately dosed     she remains in AFlutter 120's-130's this AM       Continue amiodarone gtt (restarted 05/17/2018 evening)     She remains largely asymptomatic from this, BP is ok     Trixie Maclaren change her norvasc to dilt in effort to gain rate control, continue amio gtt       4. CAD     Mild abnormal Trop, no ischemic EKG changes appreciated, likely demand     She has not had any cardiac awareness of late, no CP of any kind     Remains without complaints     No active complaints of any cardiac sounding etiology           For questions or updates, please contact [facility] Please consult [email or web address] for contact info under        Signed, Joseph Art  Dyane Dustman, PA-C  05/19/2018, 8:33 AM    I have seen and examined this patient with Tommye Standard.  Agree with above, note added to reflect my findings.  On exam, RRR, no murmurs, lungs clear.  Patient with continued tachycardia.  She is in atrial flutter today.  At this point we Kagan Mutchler continue IV amiodarone.  Should she not convert  through the weekend, she may benefit from cardioversion next week.  Analucia Hush M. Rajan Burgard MD 05/19/2018 10:42 AM

## 2018-05-19 NOTE — Progress Notes (Signed)
CBG 218. Currently no SSI coverage for PM CBG ordered. Hospitalist paged; awaiting additional orders at this time.

## 2018-05-19 NOTE — Progress Notes (Signed)
No additional insulin orders at this time. Continue to monitor CBGs per [facility] Arby Barrette, NP).

## 2018-05-19 NOTE — Progress Notes (Signed)
Physical Therapy Treatment Patient Details Name: Judith [name] MRN: [id number] DOB: 1950/10/24 Today's Date: 05/19/2018    History of Present Illness Pt is a 68 y.o. F with significant PMH of right transtibial amputation, chronic kidney disease, CAD, diabetes mellitus with peripheral neuropathy, paroxysmal atrial fibrillation, who presents after an unresponsive episode. She was found to have wide complex tachycardia and converted to Afib with RVR.    PT Comments    Patient progressing well towards her physical therapy goals. Performing transfers with min assist and hopping 3 feet with walker and close chair follow. HR remains elevated in 120-130's. Continue to recommend wheelchair for mobility at home for safety. POC remains appropriate.    Follow Up Recommendations  Home health PT;Supervision/Assistance - 24 hour     Equipment Recommendations  None recommended by PT    Recommendations for Other Services       Precautions / Restrictions Precautions Precautions: Fall Precaution Comments: right BKA Restrictions Weight Bearing Restrictions: Yes RLE Weight Bearing: Non weight bearing    Mobility  Bed Mobility               General bed mobility comments: OOB in recliner  Transfers Overall transfer level: Needs assistance Equipment used: None Transfers: Sit to/from Stand     Squat pivot transfers: Min assist     General transfer comment: Min assist to steady. Cues for hand positioning  Ambulation/Gait Ambulation/Gait assistance: Min assist;+2 safety/equipment Gait Distance (Feet): 3 Feet Assistive device: Rolling walker (2 wheeled)       General Gait Details: Pt hopping 3 feet with min assist + 2 and close chair follow. Needs cueing for pacing, sequencing, and walker proximity   Stairs             Wheelchair Mobility    Modified Rankin (Stroke Patients Only)       Balance Overall balance assessment: Needs assistance Sitting-balance support:  Feet unsupported Sitting balance-Leahy Scale: Fair     Standing balance support: Bilateral upper extremity supported Standing balance-Leahy Scale: Poor                              Cognition Arousal/Alertness: Awake/alert Behavior During Therapy: WFL for tasks assessed/performed Overall Cognitive Status: Impaired/Different from baseline Area of Impairment: Memory                     Memory: Decreased short-term memory         General Comments: Very pleasant and eager to participate      Exercises General Exercises - Lower Extremity Long Arc Quad: Both;10 reps;Seated Hip Flexion/Marching: 10 reps;Both;Seated Heel Raises: 10 reps;Left;Standing    General Comments        Pertinent Vitals/Pain Pain Assessment: Faces Faces Pain Scale: No hurt    Home Living                      Prior Function            PT Goals (current goals can now be found in the care plan section) Acute Rehab PT Goals Patient Stated Goal: "Get stronger." PT Goal Formulation: With patient Time For Goal Achievement: 05/31/18 Potential to Achieve Goals: Good Progress towards PT goals: Progressing toward goals    Frequency    Min 3X/week      PT Plan Current plan remains appropriate    Co-evaluation PT/OT/SLP Co-Evaluation/Treatment: Yes  AM-PAC PT "6 Clicks" Mobility   Outcome Measure  Help needed turning from your back to your side while in a flat bed without using bedrails?: A Little Help needed moving from lying on your back to sitting on the side of a flat bed without using bedrails?: A Lot Help needed moving to and from a bed to a chair (including a wheelchair)?: A Little Help needed standing up from a chair using your arms (e.g., wheelchair or bedside chair)?: A Little Help needed to walk in hospital room?: A Lot Help needed climbing 3-5 steps with a railing? : A Lot 6 Click Score: 15    End of Session Equipment Utilized During  Treatment: Gait belt Activity Tolerance: Patient tolerated treatment well Patient left: in chair;with call bell/phone within reach Nurse Communication: Mobility status PT Visit Diagnosis: Other abnormalities of gait and mobility (R26.89);Difficulty in walking, not elsewhere classified (R26.2)     Time: 4239-5320 PT Time Calculation (min) (ACUTE ONLY): 19 min  Charges:  $Therapeutic Exercise: 8-22 mins                    Ellamae Sia, PT, DPT Acute Rehabilitation Services Pager [phone] Office [phone]   Willy Eddy 05/19/2018, 3:40 PM

## 2018-05-20 LAB — URINE CULTURE: Culture: 20000 — AB

## 2018-05-20 LAB — BASIC METABOLIC PANEL
Anion gap: 10 (ref 5–15)
BUN: 17 mg/dL (ref 8–23)
CHLORIDE: 101 mmol/L (ref 98–111)
CO2: 24 mmol/L (ref 22–32)
Calcium: 9.9 mg/dL (ref 8.9–10.3)
Creatinine, Ser: 1.09 mg/dL — ABNORMAL HIGH (ref 0.44–1.00)
GFR calc Af Amer: >60 mL/min (ref >60)
GFR calc non Af Amer: 53 mL/min — ABNORMAL LOW (ref >60)
Glucose, Bld: 132 mg/dL — ABNORMAL HIGH (ref 70–99)
Potassium: 4.8 mmol/L (ref 3.5–5.1)
SODIUM: 135 mmol/L (ref 135–145)

## 2018-05-20 LAB — CBC
HCT: 33.6 % — ABNORMAL LOW (ref 36.0–46.0)
HEMOGLOBIN: 10.7 g/dL — AB (ref 12.0–15.0)
MCH: 28.2 pg (ref 26.0–34.0)
MCHC: 31.8 g/dL (ref 30.0–36.0)
MCV: 88.7 fL (ref 80.0–100.0)
Platelets: 362 10*3/uL (ref 150–400)
RBC: 3.79 MIL/uL — ABNORMAL LOW (ref 3.87–5.11)
RDW: 13.9 % (ref 11.5–15.5)
WBC: 6.9 10*3/uL (ref 4.0–10.5)
nRBC: 0 % (ref 0.0–0.2)

## 2018-05-20 LAB — MAGNESIUM: MAGNESIUM: 1.5 mg/dL — AB (ref 1.7–2.4)

## 2018-05-20 LAB — GLUCOSE, CAPILLARY
Glucose-Capillary: 141 mg/dL — ABNORMAL HIGH (ref 70–99)
Glucose-Capillary: 207 mg/dL — ABNORMAL HIGH (ref 70–99)
Glucose-Capillary: 165 mg/dL — ABNORMAL HIGH (ref 70–99)
Glucose-Capillary: 225 mg/dL — ABNORMAL HIGH (ref 70–99)

## 2018-05-20 MED ORDER — MAGNESIUM OXIDE 400 (241.3 MG) MG PO TABS
400.0000 mg | ORAL_TABLET | Freq: Two times a day (BID) | ORAL | Status: DC
  Administered 2018-05-20 – 2018-05-23 (×7): 400 mg via ORAL
  Filled 2018-05-20 (×7): qty 1

## 2018-05-20 MED ORDER — OLOPATADINE HCL 0.1 % OP SOLN
1.0000 [drp] | Freq: Two times a day (BID) | OPHTHALMIC | Status: DC
  Administered 2018-05-20 – 2018-05-23 (×6): 1 [drp] via OPHTHALMIC
  Filled 2018-05-20 (×2): qty 5

## 2018-05-20 MED ORDER — MAGNESIUM SULFATE 2 GM/50ML IV SOLN
2.0000 g | Freq: Once | INTRAVENOUS | Status: AC
  Administered 2018-05-20: 2 g via INTRAVENOUS
  Filled 2018-05-20: qty 50

## 2018-05-20 MED ORDER — SENNOSIDES-DOCUSATE SODIUM 8.6-50 MG PO TABS
1.0000 | ORAL_TABLET | Freq: Two times a day (BID) | ORAL | Status: DC
  Administered 2018-05-20 – 2018-05-21 (×3): 1 via ORAL
  Filled 2018-05-20 (×3): qty 1

## 2018-05-20 MED ORDER — POLYETHYLENE GLYCOL 3350 17 G PO PACK
17.0000 g | PACK | Freq: Every day | ORAL | Status: DC | PRN
  Administered 2018-05-22: 17 g via ORAL
  Filled 2018-05-20: qty 1

## 2018-05-20 NOTE — Plan of Care (Signed)

## 2018-05-20 NOTE — Progress Notes (Signed)
This note also relates to the following rows which could not be included: Pulse Rate - Cannot attach notes to unvalidated device data Resp - Cannot attach notes to unvalidated device data SpO2 - Cannot attach notes to unvalidated device data  Pt's family places pt on/off home cpap. RT will monitor.

## 2018-05-20 NOTE — Progress Notes (Addendum)
PROGRESS NOTE  Judith [name]  SKA:[id number] DOB: 15-Dec-1950 DOA: 05/14/2018 PCP: Rutherford Guys, MD  Brief Narrative: Judith [name] is a 68 y.o. female with a history of AFib, VT, stage III CKD, recent right BKA, and T2DM who presented to the ED with an unresponsive episode. She is living with her sister since undergoing right BKA recently and has been failing to thrive generally, eating less, getting up less, but on 1/5 noted palpitations typical of previous episodes of AFib with RVR. Her sister drove her to the ED when en route the patient had 2-3 minutes of tensing bilateral arms, being unresponsive with eyes rolled in the back of her head, maintaining postural tone in the seat, then began moving all limbs, bumping the BKA stump against the dashboard which would normally cause excruciating pain though didn't seem to cause pain. On arrival she was found to have a wide complex tachycardia, given amiodarone bolus and metoprolol with conversion to narrow AFib with RVR. Mental status had improved when the patient had another episode lasting about 3 minutes terminated with ativan 2mg  IV, postictal-appearing thereafter. Mental status has waxed and waned consistent with delirium, but has largely returned to baseline. Nausea has resolved and she's eating a normal diet, has had no further seizure-like activity. Heart rhythm converted to NSR overnight into 05/15/2018, though with transition to po amiodarone 1/8, went back into AFib/aflutter prompting restart of amiodarone infusion. Has remained in rapid atrial flutter for several days. If continues, planning cardioversion 1/13.  Assessment & Plan: Principal Problem:   V-tach [place] [facility] [place]) Active Problems:   DM (diabetes mellitus), type 2 with peripheral vascular complications (HCC)   HTN (hypertension)   Memory loss   Atrial fibrillation with RVR (HCC)   Chronic combined systolic and diastolic congestive heart failure (HCC)   CKD (chronic kidney disease), stage III  (HCC)   Seizure (HCC)  Acute metabolic encephalopathy: Due to postictal state, possibly related to arrhythmia, also likely an element of acute delirium as she's struggled with that in the hospital in the past. Also has had subacute decline in mood, appetite, etc. following BKA, so suspect depression also contributing on longer time scale.  - Delirium precautions - Will hold sedating medications as able. Do not want to precipitate gabapentin withdrawal, so restarted this at previous dose. No longer sedated, so will not further lower dose. - Treat UTI as below  UTI: Pyuria on UA. Also w/leukocytosis. Unclear whether this is contributing to AMS and/or arrhythmia. Serratia on urine culture, sensitive to CTX. No leukocytosis, afebrile.  - Started CTX, will complete 3 days today.  Wide complex tachycardia, possibly ventricular tachycardia, though cardiology favors persistent AFib with RVR, AFlutter: Echocardiogram with EF 60-65%, G1DD, no regional WMA. mild-mod AS. - Continue cardiac monitoring. On my personal review of telemetry this morning, remains in atrial flutter in rates >120bpm. . Personally reviewed today, my interpretation is rapid AFlutter typically in 130bpms.  - Appreciate cardiology/EP assistance. Continue amio gtt, if not able to convert, may require cardioversion. Continues to be not very symptomatic at this time. Adding dilt, not planning to restart sotalol. BPs too soft to titrate diltiazem. - CHA2DS2-VASc score is 5, restarted eliquis. - Recurrent hypomagnesemia: Mg 1.5, give 2g and restart home po replacement. Suspect altered renal handling in the absence of GI losses. K stable without replacement.  Troponin elevation: Suspect demand ischemia in setting of arrhythmia, also suspected to be etiology of lactic acid elevation. Lactate has cleared, troponin still with elevation as  expected with CAD and significant demand. No intervention planned per cardiology. ECG 1/9 read as STEMI, false  positive per cardiology. No chest pain.  New onset seizures/seizure-like activity: No intracerebral abnormalities on imaging, EEG the following day is interpreted as a normal EEG in sleeping and awake states.  - Stopped tramadol.  - Since it is first episode and in setting of dehydration, arrhythmia, and EEG normal will not start AED at this time.  - prn ativan  Nausea: Waxing/waning PTA. Has been evaluated by GI as outpatient.  - Antiemetics prn, tolerating diet, advance as tolerated.  Osteoarthritis of left shoulder, chronic pain, s/p right BKA: Normal wound healing. - Seems to be improved on hydrocodone prn with good tolerance. - Orthopedic follow up as scheduled.   IDT2DM: Last HBA1c 8%. Recently increasing CBGs with improved po intake. Alertnative/additional explanation would be undiscovered infection, though this has been treated/prsumptive UTI. - Added AM dose of long-acting insulin, 10u BID with stable fasting CBG at goal. Postprandial values elevated, so increased AC scale. I feel adding HS scale would put at risk of hypoglycemia.   Stage III CKD:  - Monitor, avoid nephrotoxins.   HTN:  - Continue norvasc.   OSA:  - Continue CPAP qHS  Hyperthyroidism:  - Continue tapazole. TSH wnl.  Constipation:  - Senna scheduled  Depression with adjustment disorder:  - Continue home medications - Needs outpatient therapy  Allergic conjunctivitis:  - Patanol  DVT prophylaxis: Eliquis. Code Status: Full Family Communication: None at bedside Disposition Plan: Uncertain. Remain in 2H due to uncontrolled heart rate/rhythm. Consultants:   Cardiology/EP  Procedures:   Echocardiogram 05/16/2018: - Left ventricle: The cavity size was normal. Wall thickness was   increased in a pattern of severe LVH. Systolic function was   normal. The estimated ejection fraction was in the range of 60%   to 65%. Wall motion was normal; there were no regional wall   motion abnormalities. There  was an increased relative   contribution of atrial contraction to ventricular filling.   Doppler parameters are consistent with abnormal left ventricular   relaxation (grade 1 diastolic dysfunction). Doppler parameters   are consistent with high ventricular filling pressure. - Aortic valve: Valve mobility was restricted. There was mild to   moderate stenosis. Mean gradient (S): 17 mm Hg. VTI ratio of LVOT   to aortic valve: 0.36. Valve area (VTI): 1.37 cm^2. Valve area   (Vmax): 1.22 cm^2. Valve area (Vmean): 1.27 cm^2. - Mitral valve: Calcified annulus. Valve area by pressure   half-time: 2.39 cm^2. - Left atrium: The atrium was moderately dilated. - Pulmonary arteries: Systolic pressure could not be accurately   estimated.  Antimicrobials:  Ceftriaxone 1/8 >>   Subjective: Itchy eyes for the past couple days, no vision changes, eye pain, FB sensation. No fevers. Feels short of breath on minimal exertion, but at rest feels fine.    Objective: Vitals:   05/20/18 1200 05/20/18 1300 05/20/18 1400 05/20/18 1500  BP: 114/68 121/86 117/73 (!) 125/94  Pulse: (!) 112 (!) 132 (!) 127 (!) 128  Resp: 20 (!) 27 17 18   Temp:      TempSrc:      SpO2: 97% 100% 99% 98%  Weight:  72.1 kg    Height:  5\' 4"  (1.626 m)      Intake/Output Summary (Last 24 hours) at 05/20/2018 1511 Last data filed at 05/20/2018 1500 Gross per 24 hour  Intake 1393.22 ml  Output 2200 ml  Net -806.78  ml   Gen: 68 y.o. female in no distress HEENT: Eyes without FB, sclerae anicteric, conjunctivae injected with perilimbal sparing. Pulm: Nonlabored breathing room air. Clear. CV: Regular tachycardia. No new murmur, rub, or gallop. No JVD, no dependent edema. GI: Abdomen soft, non-tender, non-distended, with normoactive bowel sounds.  Ext: Warm, right BKA stable Skin: No rashes, lesions or ulcers on visualized skin. Neuro: Alert and oriented. No focal neurological deficits. Psych: Judgement and insight appear fair.  Mood euthymic & affect congruent. Behavior is appropriate.    Data Reviewed: I have personally reviewed following labs and imaging studies  CBC: Recent Labs  Lab 05/14/18 1052 05/15/18 0215 05/18/18 1637 05/20/18 0444  WBC 12.6* 13.5* 9.2 6.9  NEUTROABS 8.3*  --   --   --   HGB 13.9 12.5 11.3* 10.7*  HCT 45.5 38.0 36.0 33.6*  MCV 88.2 85.4 86.5 88.7  PLT 624* 474* 364 702   Basic Metabolic Panel: Recent Labs  Lab 05/17/18 0356 05/17/18 1134 05/18/18 0420 05/18/18 1637 05/19/18 0549 05/20/18 0444  NA 135  --  133* 134* 136 135  K 3.7 4.8 4.4 4.8 5.0 4.8  CL 104  --  103 104 104 101  CO2 22  --  21* 21* 22 24  GLUCOSE 131*  --  264* 160* 129* 132*  BUN 23  --  20 17 16 17   CREATININE 1.16*  --  1.14* 1.03* 1.07* 1.09*  CALCIUM 9.6  --  10.0 10.1 9.8 9.9  MG 1.8 2.2 1.5* 1.9 1.7 1.5*   GFR: Estimated Creatinine Clearance: 50.1 mL/min (A) (by C-G formula based on SCr of 1.09 mg/dL (H)). Liver Function Tests: Recent Labs  Lab 05/14/18 1052  AST 32  ALT 14  ALKPHOS 57  BILITOT 0.6  PROT 8.6*  ALBUMIN 3.6   Recent Labs  Lab 05/14/18 1052  LIPASE 35   No results for input(s): AMMONIA in the last 168 hours. Coagulation Profile: No results for input(s): INR, PROTIME in the last 168 hours. Cardiac Enzymes: Recent Labs  Lab 05/14/18 1446 05/14/18 2046 05/15/18 0215 05/15/18 1755  TROPONINI 0.07* 0.20* 0.68* 1.16*   BNP (last 3 results) No results for input(s): PROBNP in the last 8760 hours. HbA1C: No results for input(s): HGBA1C in the last 72 hours. CBG: Recent Labs  Lab 05/19/18 1142 05/19/18 1649 05/19/18 2141 05/20/18 0653 05/20/18 1201  GLUCAP 293* 119* 218* 141* 207*   Lipid Profile: No results for input(s): CHOL, HDL, LDLCALC, TRIG, CHOLHDL, LDLDIRECT in the last 72 hours. Thyroid Function Tests: No results for input(s): TSH, T4TOTAL, FREET4, T3FREE, THYROIDAB in the last 72 hours. Anemia Panel: No results for input(s): VITAMINB12,  FOLATE, FERRITIN, TIBC, IRON, RETICCTPCT in the last 72 hours. Urine analysis:    Component Value Date/Time   COLORURINE YELLOW 05/14/2018 1119   APPEARANCEUR CLOUDY (A) 05/14/2018 1119   LABSPEC 1.017 05/14/2018 1119   PHURINE 5.0 05/14/2018 1119   GLUCOSEU >=500 (A) 05/14/2018 1119   HGBUR SMALL (A) 05/14/2018 1119   BILIRUBINUR NEGATIVE 05/14/2018 1119   BILIRUBINUR negative 07/22/2017 0858   BILIRUBINUR Small 12/06/2014 1441   KETONESUR NEGATIVE 05/14/2018 1119   PROTEINUR 100 (A) 05/14/2018 1119   UROBILINOGEN 0.2 07/22/2017 0858   UROBILINOGEN 1.0 02/12/2015 1238   NITRITE NEGATIVE 05/14/2018 1119   LEUKOCYTESUR LARGE (A) 05/14/2018 1119   Recent Results (from the past 240 hour(s))  Culture, Urine     Status: Abnormal   Collection Time: 05/18/18  3:04 AM  Result Value Ref Range Status   Specimen Description URINE, CLEAN CATCH  Final   Special Requests   Final    NONE Performed at [facility], [address] [place], [place] [address]    Culture 20,000 COLONIES/mL SERRATIA MARCESCENS (A)  Final   Report Status 05/20/2018 FINAL  Final   Organism ID, Bacteria SERRATIA MARCESCENS (A)  Final      Susceptibility   Serratia marcescens - MIC*    CEFAZOLIN >=64 RESISTANT Resistant     CEFTRIAXONE <=1 SENSITIVE Sensitive     CIPROFLOXACIN <=0.25 SENSITIVE Sensitive     GENTAMICIN <=1 SENSITIVE Sensitive     NITROFURANTOIN 256 RESISTANT Resistant     TRIMETH/SULFA <=20 SENSITIVE Sensitive     * 20,000 COLONIES/mL SERRATIA MARCESCENS      Radiology Studies: No results found.  Scheduled Meds: . apixaban  5 mg Oral BID  . atorvastatin  20 mg Oral q1800  . buPROPion  150 mg Oral BID  . diltiazem  120 mg Oral Daily  . FLUoxetine  20 mg Oral QHS  . gabapentin  900 mg Oral BID  . insulin glargine  10 Units Subcutaneous BID  . insulin lispro  4-18 Units Subcutaneous TID WC  . methimazole  10 mg Oral Daily  . olopatadine  1 drop Both Eyes BID  . pantoprazole  40 mg  Oral BID  . senna-docusate  1 tablet Oral BID  . sodium chloride flush  3 mL Intravenous Q12H   Continuous Infusions: . sodium chloride Stopped (05/15/18 1725)  . amiodarone 30 mg/hr (05/20/18 1500)  . cefTRIAXone (ROCEPHIN)  IV Stopped (05/19/18 1825)     LOS: 6 days   Time spent: 35 minutes.  Patrecia Pour, MD [facility] [email or web address] Password [email or web address] 05/20/2018, 3:11 PM

## 2018-05-20 NOTE — Progress Notes (Signed)
Magnesium 1.5 on AM labs. [facility] paged; awaiting additional orders.

## 2018-05-20 NOTE — Progress Notes (Signed)
Progress Note   Subjective   Doing well today, the patient denies CP or SOB.  Remains in AF on IV amiodarone as started by Dr Lovena Le.  No new concerns  Inpatient Medications    Scheduled Meds: . apixaban  5 mg Oral BID  . atorvastatin  20 mg Oral q1800  . buPROPion  150 mg Oral BID  . diltiazem  120 mg Oral Daily  . FLUoxetine  20 mg Oral QHS  . gabapentin  900 mg Oral BID  . insulin glargine  10 Units Subcutaneous BID  . insulin lispro  4-18 Units Subcutaneous TID WC  . methimazole  10 mg Oral Daily  . pantoprazole  40 mg Oral BID  . sodium chloride flush  3 mL Intravenous Q12H   Continuous Infusions: . sodium chloride Stopped (05/15/18 1725)  . amiodarone 30 mg/hr (05/20/18 1000)  . cefTRIAXone (ROCEPHIN)  IV Stopped (05/19/18 1825)   PRN Meds: sodium chloride, HYDROcodone-acetaminophen, levalbuterol, ondansetron **OR** ondansetron (ZOFRAN) IV, sodium chloride flush   Vital Signs    Vitals:   05/20/18 0705 05/20/18 0800 05/20/18 0900 05/20/18 1000  BP: (!) 130/95 (!) 129/95 (!) 143/100 111/80  Pulse: (!) 128 (!) 128 (!) 129 (!) 133  Resp: 14 18 20 16   Temp:      TempSrc:      SpO2: 100% 98% 98% 98%    Intake/Output Summary (Last 24 hours) at 05/20/2018 1058 Last data filed at 05/20/2018 1000 Gross per 24 hour  Intake 1070.66 ml  Output 2200 ml  Net -1129.34 ml   There were no vitals filed for this visit.  Telemetry    Typical atrial flutter - Personally Reviewed  Physical Exam   GEN- The patient is well appearing, alert and oriented x 3 today.   Head- normocephalic, atraumatic Eyes-  Sclera clear, conjunctiva pink Ears- hearing intact Oropharynx- clear Neck- supple, Lungs- Clear to ausculation bilaterally, normal work of breathing Heart- irregular rate and rhythm  GI- soft, NT, ND, + BS Extremities- no clubbing, cyanosis, or edema s/p R leg BKA Skin- no rash or lesion Psych- euthymic mood, full affect Neuro- strength and sensation are  intact   Labs    Chemistry Recent Labs  Lab 05/14/18 1052  05/18/18 1637 05/19/18 0549 05/20/18 0444  NA 133*   < > 134* 136 135  K 3.4*   < > 4.8 5.0 4.8  CL 97*   < > 104 104 101  CO2 13*   < > 21* 22 24  GLUCOSE 337*   < > 160* 129* 132*  BUN 24*   < > 17 16 17   CREATININE 1.39*   < > 1.03* 1.07* 1.09*  CALCIUM 7.8*   < > 10.1 9.8 9.9  PROT 8.6*  --   --   --   --   ALBUMIN 3.6  --   --   --   --   AST 32  --   --   --   --   ALT 14  --   --   --   --   ALKPHOS 57  --   --   --   --   BILITOT 0.6  --   --   --   --   GFRNONAA 40*   < > 57* 54* 53*  GFRAA 46*   < > >60 >60 >60  ANIONGAP 23*   < > 9 10 10    < > = values in  this interval not displayed.     Hematology Recent Labs  Lab 05/15/18 0215 05/18/18 1637 05/20/18 0444  WBC 13.5* 9.2 6.9  RBC 4.45 4.16 3.79*  HGB 12.5 11.3* 10.7*  HCT 38.0 36.0 33.6*  MCV 85.4 86.5 88.7  MCH 28.1 27.2 28.2  MCHC 32.9 31.4 31.8  RDW 13.9 13.7 13.9  PLT 474* 364 362    Cardiac Enzymes Recent Labs  Lab 05/14/18 1446 05/14/18 2046 05/15/18 0215 05/15/18 1755  TROPONINI 0.07* 0.20* 0.68* 1.16*    Recent Labs  Lab 05/14/18 1050  TROPIPOC 0.02    Patient Profile     68 y.o. female with a hx of CAD (PCI w/DES to LCx/OM, known CTO RCA in 2016), HTN, HLD, hyperthyroidism on methimazole, severe OSA w/BIPAP, DM, PVD is s/p recent Righ BKA (04/15/18), and persistent AFibadmitted after a seizure vs syncopal event, proceeded by 3 days of nausea, poor PO intake, and no meds for 2 days, noted in AFib w/RVR.    Assessment & Plan    1.  Persistent afib/ typical atrial flutter Currently receiving IV amiodarone as per Dr Lovena Le Plan as per Dr Curt Bears is IV amiodarone over the weekend with cardioversion on Monday. Pt is scheduled for cardioversion on Monday at 2pm. Would switch to PO amiodarone on Monday am. Continue eliquis  2. CAD No ischemic symptoms No changes  3.  Seizure vs syncope in the setting of medical  illness Improved Sotalol discontinued Now on amiodarone EF is preserved No further workup for this is planned  Thompson Grayer MD, [facility] 05/20/2018 10:58 AM]\

## 2018-05-21 LAB — GLUCOSE, CAPILLARY
GLUCOSE-CAPILLARY: 128 mg/dL — AB (ref 70–99)
Glucose-Capillary: 122 mg/dL — ABNORMAL HIGH (ref 70–99)
Glucose-Capillary: 234 mg/dL — ABNORMAL HIGH (ref 70–99)
Glucose-Capillary: 297 mg/dL — ABNORMAL HIGH (ref 70–99)

## 2018-05-21 LAB — BASIC METABOLIC PANEL
ANION GAP: 9 (ref 5–15)
BUN: 21 mg/dL (ref 8–23)
CALCIUM: 9.4 mg/dL (ref 8.9–10.3)
CO2: 21 mmol/L — ABNORMAL LOW (ref 22–32)
Chloride: 104 mmol/L (ref 98–111)
Creatinine, Ser: 1.14 mg/dL — ABNORMAL HIGH (ref 0.44–1.00)
Glucose, Bld: 148 mg/dL — ABNORMAL HIGH (ref 70–99)
Potassium: 4.8 mmol/L (ref 3.5–5.1)
Sodium: 134 mmol/L — ABNORMAL LOW (ref 135–145)

## 2018-05-21 LAB — MAGNESIUM: Magnesium: 1.6 mg/dL — ABNORMAL LOW (ref 1.7–2.4)

## 2018-05-21 MED ORDER — NAPHAZOLINE-PHENIRAMINE 0.025-0.3 % OP SOLN
1.0000 [drp] | Freq: Four times a day (QID) | OPHTHALMIC | Status: DC | PRN
  Filled 2018-05-21: qty 15

## 2018-05-21 MED ORDER — MAGNESIUM SULFATE 2 GM/50ML IV SOLN
2.0000 g | Freq: Once | INTRAVENOUS | Status: AC
  Administered 2018-05-21: 2 g via INTRAVENOUS
  Filled 2018-05-21: qty 50

## 2018-05-21 MED ORDER — AMIODARONE HCL 200 MG PO TABS
200.0000 mg | ORAL_TABLET | Freq: Two times a day (BID) | ORAL | Status: DC
  Administered 2018-05-21 – 2018-05-23 (×4): 200 mg via ORAL
  Filled 2018-05-21 (×4): qty 1

## 2018-05-21 MED ORDER — SENNOSIDES-DOCUSATE SODIUM 8.6-50 MG PO TABS
1.0000 | ORAL_TABLET | Freq: Every day | ORAL | Status: DC
  Administered 2018-05-22 – 2018-05-23 (×2): 1 via ORAL
  Filled 2018-05-21 (×2): qty 1

## 2018-05-21 MED ORDER — MAGNESIUM SULFATE 4 GM/100ML IV SOLN
4.0000 g | Freq: Once | INTRAVENOUS | Status: AC
  Administered 2018-05-21: 4 g via INTRAVENOUS
  Filled 2018-05-21: qty 100

## 2018-05-21 NOTE — Plan of Care (Signed)
  Problem: Education: Goal: Knowledge of disease or condition will improve Outcome: Progressing Goal: Understanding of medication regimen will improve Outcome: Progressing   Problem: Activity: Goal: Ability to tolerate increased activity will improve Outcome: Progressing   Problem: Cardiac: Goal: Ability to achieve and maintain adequate cardiopulmonary perfusion will improve Outcome: Progressing   Problem: Education: Goal: Knowledge of General Education information will improve Description Including pain rating scale, medication(s)/side effects and non-pharmacologic comfort measures Outcome: Progressing   Problem: Clinical Measurements: Goal: Ability to maintain clinical measurements within normal limits will improve Outcome: Progressing Goal: Will remain free from infection Outcome: Progressing Goal: Diagnostic test results will improve Outcome: Progressing Goal: Respiratory complications will improve Outcome: Progressing

## 2018-05-21 NOTE — H&P (View-Only) (Signed)
Progress Note   Subjective   Doing well today, the patient denies CP or SOB.  Tolerating amiodarone.  No nausea or vomiting.  No new concerns  Inpatient Medications    Scheduled Meds: . apixaban  5 mg Oral BID  . atorvastatin  20 mg Oral q1800  . buPROPion  150 mg Oral BID  . diltiazem  120 mg Oral Daily  . FLUoxetine  20 mg Oral QHS  . gabapentin  900 mg Oral BID  . insulin glargine  10 Units Subcutaneous BID  . insulin lispro  4-18 Units Subcutaneous TID WC  . magnesium oxide  400 mg Oral BID  . methimazole  10 mg Oral Daily  . olopatadine  1 drop Both Eyes BID  . pantoprazole  40 mg Oral BID  . [START ON 05/22/2018] senna-docusate  1 tablet Oral Daily  . sodium chloride flush  3 mL Intravenous Q12H   Continuous Infusions: . sodium chloride Stopped (05/15/18 1725)  . amiodarone 30 mg/hr (05/21/18 1122)  . magnesium sulfate 1 - 4 g bolus IVPB 4 g (05/21/18 1131)   PRN Meds: sodium chloride, HYDROcodone-acetaminophen, levalbuterol, naphazoline-pheniramine, ondansetron **OR** ondansetron (ZOFRAN) IV, polyethylene glycol, sodium chloride flush   Vital Signs    Vitals:   05/21/18 0800 05/21/18 0810 05/21/18 0900 05/21/18 1002  BP: 126/90  111/73 124/84  Pulse: (!) 127  (!) 134 (!) 129  Resp: 17  15 17   Temp:  (!) 96.7 F (35.9 C)    TempSrc:  Axillary    SpO2: 99%  100% 94%  Weight:      Height:        Intake/Output Summary (Last 24 hours) at 05/21/2018 1137 Last data filed at 05/21/2018 1000 Gross per 24 hour  Intake 1594.51 ml  Output 1700 ml  Net -105.49 ml   Filed Weights   05/20/18 1300  Weight: 72.1 kg    Telemetry    Atrial flutter with 2:1 conduction - Personally Reviewed  Physical Exam   GEN- The patient is well appearing, alert and oriented x 3 today.   Head- normocephalic, atraumatic Eyes-  Sclera clear, conjunctiva pink Ears- hearing intact Oropharynx- clear Neck- supple, Lungs- Clear to ausculation bilaterally, normal work of  breathing Heart- tachycardic regular rhythm  GI- soft, NT, ND, + BS Extremities- no clubbing, cyanosis, or edema  MS- s/p R BKA Skin- no rash or lesion Psych- euthymic mood, full affect Neuro- strength and sensation are intact   Labs    Chemistry Recent Labs  Lab 05/19/18 0549 05/20/18 0444 05/21/18 0247  NA 136 135 134*  K 5.0 4.8 4.8  CL 104 101 104  CO2 22 24 21*  GLUCOSE 129* 132* 148*  BUN 16 17 21   CREATININE 1.07* 1.09* 1.14*  CALCIUM 9.8 9.9 9.4  GFRNONAA 54* 53* NOT CALCULATED  GFRAA >60 >60 NOT CALCULATED  ANIONGAP 10 10 9      Hematology Recent Labs  Lab 05/15/18 0215 05/18/18 1637 05/20/18 0444  WBC 13.5* 9.2 6.9  RBC 4.45 4.16 3.79*  HGB 12.5 11.3* 10.7*  HCT 38.0 36.0 33.6*  MCV 85.4 86.5 88.7  MCH 28.1 27.2 28.2  MCHC 32.9 31.4 31.8  RDW 13.9 13.7 13.9  PLT 474* 364 362    Cardiac Enzymes Recent Labs  Lab 05/14/18 1446 05/14/18 2046 05/15/18 0215 05/15/18 1755  TROPONINI 0.07* 0.20* 0.68* 1.16*   No results for input(s): TROPIPOC in the last 168 hours.     Patient Profile  68 y.o.femalewith a hx of CAD (PCI w/DES to LCx/OM, known CTO RCA in 2016), HTN, HLD, hyperthyroidism on methimazole, severe OSA w/BIPAP, DM, PVD is s/p recent Righ BKA (04/15/18), and persistent AFibadmitted after a seizure vs syncopal event, proceeded by 3 days of nausea, poor PO intake, and no meds for 2 days, noted in AFib w/RVR.   Assessment & Plan    1.  Persistent afib/ typical atrial flutter Started on IV amiodarone by Dr Barrie Lyme to oral amiodarone 200mg  BID after current bag is complete (would be 11 pm tonight) NPO after midnight for cardioversion tomorrow  2. CAD No ischemic symptoms  3. Low Mg Stop PPI which may be contributing  Cardioversion planned for tomorrow Could go home tomorrow after if in sinus from an EP standpoint on amiodarone 200mg  BID Would follow-up in 1 week in the AF clinic  Thompson Grayer MD, [facility] 05/21/2018 11:37  AM

## 2018-05-21 NOTE — Progress Notes (Signed)
Progress Note   Subjective   Doing well today, the patient denies CP or SOB.  Tolerating amiodarone.  No nausea or vomiting.  No new concerns  Inpatient Medications    Scheduled Meds: . apixaban  5 mg Oral BID  . atorvastatin  20 mg Oral q1800  . buPROPion  150 mg Oral BID  . diltiazem  120 mg Oral Daily  . FLUoxetine  20 mg Oral QHS  . gabapentin  900 mg Oral BID  . insulin glargine  10 Units Subcutaneous BID  . insulin lispro  4-18 Units Subcutaneous TID WC  . magnesium oxide  400 mg Oral BID  . methimazole  10 mg Oral Daily  . olopatadine  1 drop Both Eyes BID  . pantoprazole  40 mg Oral BID  . [START ON 05/22/2018] senna-docusate  1 tablet Oral Daily  . sodium chloride flush  3 mL Intravenous Q12H   Continuous Infusions: . sodium chloride Stopped (05/15/18 1725)  . amiodarone 30 mg/hr (05/21/18 1122)  . magnesium sulfate 1 - 4 g bolus IVPB 4 g (05/21/18 1131)   PRN Meds: sodium chloride, HYDROcodone-acetaminophen, levalbuterol, naphazoline-pheniramine, ondansetron **OR** ondansetron (ZOFRAN) IV, polyethylene glycol, sodium chloride flush   Vital Signs    Vitals:   05/21/18 0800 05/21/18 0810 05/21/18 0900 05/21/18 1002  BP: 126/90  111/73 124/84  Pulse: (!) 127  (!) 134 (!) 129  Resp: 17  15 17   Temp:  (!) 96.7 F (35.9 C)    TempSrc:  Axillary    SpO2: 99%  100% 94%  Weight:      Height:        Intake/Output Summary (Last 24 hours) at 05/21/2018 1137 Last data filed at 05/21/2018 1000 Gross per 24 hour  Intake 1594.51 ml  Output 1700 ml  Net -105.49 ml   Filed Weights   05/20/18 1300  Weight: 72.1 kg    Telemetry    Atrial flutter with 2:1 conduction - Personally Reviewed  Physical Exam   GEN- The patient is well appearing, alert and oriented x 3 today.   Head- normocephalic, atraumatic Eyes-  Sclera clear, conjunctiva pink Ears- hearing intact Oropharynx- clear Neck- supple, Lungs- Clear to ausculation bilaterally, normal work of  breathing Heart- tachycardic regular rhythm  GI- soft, NT, ND, + BS Extremities- no clubbing, cyanosis, or edema  MS- s/p R BKA Skin- no rash or lesion Psych- euthymic mood, full affect Neuro- strength and sensation are intact   Labs    Chemistry Recent Labs  Lab 05/19/18 0549 05/20/18 0444 05/21/18 0247  NA 136 135 134*  K 5.0 4.8 4.8  CL 104 101 104  CO2 22 24 21*  GLUCOSE 129* 132* 148*  BUN 16 17 21   CREATININE 1.07* 1.09* 1.14*  CALCIUM 9.8 9.9 9.4  GFRNONAA 54* 53* NOT CALCULATED  GFRAA >60 >60 NOT CALCULATED  ANIONGAP 10 10 9      Hematology Recent Labs  Lab 05/15/18 0215 05/18/18 1637 05/20/18 0444  WBC 13.5* 9.2 6.9  RBC 4.45 4.16 3.79*  HGB 12.5 11.3* 10.7*  HCT 38.0 36.0 33.6*  MCV 85.4 86.5 88.7  MCH 28.1 27.2 28.2  MCHC 32.9 31.4 31.8  RDW 13.9 13.7 13.9  PLT 474* 364 362    Cardiac Enzymes Recent Labs  Lab 05/14/18 1446 05/14/18 2046 05/15/18 0215 05/15/18 1755  TROPONINI 0.07* 0.20* 0.68* 1.16*   No results for input(s): TROPIPOC in the last 168 hours.     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   Assessment & Plan    1.  Persistent afib/ typical atrial flutter Started on IV amiodarone by Dr Barrie Lyme to oral amiodarone 200mg  BID after current bag is complete (would be 11 pm tonight) NPO after midnight for cardioversion tomorrow  2. CAD No ischemic symptoms  3. Low Mg Stop PPI which may be contributing  Cardioversion planned for tomorrow Could go home tomorrow after if in sinus from an EP standpoint on amiodarone 200mg  BID Would follow-up in 1 week in the AF clinic  Thompson Grayer MD, [facility] 05/21/2018 11:37  AM

## 2018-05-21 NOTE — Plan of Care (Signed)

## 2018-05-21 NOTE — Progress Notes (Signed)
PROGRESS NOTE  Judith [name]  EHM:[id number] DOB: [birth year] DOA: 05/14/2018 PCP: Rutherford Guys, MD  Brief Narrative: Judith [name] is a 68 y.o. female with a history of AFib, VT, stage III CKD, recent right BKA, and T2DM who presented to the ED with an unresponsive episode. She is living with her sister since undergoing right BKA recently and has been failing to thrive generally, eating less, getting up less, but on 1/5 noted palpitations typical of previous episodes of AFib with RVR. Her sister drove her to the ED when en route the patient had 2-3 minutes of tensing bilateral arms, being unresponsive with eyes rolled in the back of her head, maintaining postural tone in the seat, then began moving all limbs, bumping the BKA stump against the dashboard which would normally cause excruciating pain though didn't seem to cause pain. On arrival she was found to have a wide complex tachycardia, given amiodarone bolus and metoprolol with conversion to narrow AFib with RVR. Mental status had improved when the patient had another episode lasting about 3 minutes terminated with ativan 2mg  IV, postictal-appearing thereafter. Mental status has waxed and waned consistent with delirium, but has largely returned to baseline. Nausea has resolved and she's eating a normal diet, has had no further seizure-like activity. Heart rhythm converted to NSR overnight into 05/15/2018, though with transition to po amiodarone 1/8, went back into AFib/aflutter prompting restart of amiodarone infusion. Has remained in rapid atrial flutter for several days. If continues, planning cardioversion 1/13.  Assessment & Plan: Principal Problem:   V-tach [facility]) Active Problems:   DM (diabetes mellitus), type 2 with peripheral vascular complications (HCC)   HTN (hypertension)   Memory loss   Atrial fibrillation with RVR (HCC)   Chronic combined systolic and diastolic congestive heart failure (HCC)   CKD (chronic kidney disease), stage III  (HCC)   Seizure (HCC)  Acute metabolic encephalopathy: Due to postictal state, possibly related to arrhythmia, also likely an element of acute delirium as she's struggled with that in the hospital in the past. Also has had subacute decline in mood, appetite, etc. following BKA, so suspect depression also contributing on longer time scale.  - Delirium precautions - Will hold sedating medications as able. Do not want to precipitate gabapentin withdrawal, so restarted this at previous dose. No longer sedated, so will not further lower dose. - Treat UTI as below  UTI: Pyuria on UA. Also w/leukocytosis. Unclear whether this is contributing to AMS and/or arrhythmia. Serratia on urine culture, sensitive to CTX. No leukocytosis, afebrile.  - Started CTX, will complete 3 days today.  Wide complex tachycardia, possibly ventricular tachycardia, though cardiology favors persistent AFib with RVR, AFlutter: Echocardiogram with EF 60-65%, G1DD, no regional WMA. mild-mod AS. This is not improving as expected. - Continue cardiac monitoring. Personal telemetry review: HR remaining generally >120bpm. - Appreciate cardiology/EP assistance. Continue amio gtt, if not able to convert, may require cardioversion. Will prepare for tomorrow. Continues to be not very symptomatic at this time. Added diltiazem, could consider increasing dose since BPs lately improving. - CHA2DS2-VASc score is 5, restarted eliquis.   Recurrent hypomagnesemia: Gitelman/bartter unlikely without other constellation of derangements/alkalemia. DM controlled, not renal transplant patient, and not on loop/thiazides. - Started daily po supplement 400mg  po BID, still low. Will give mag 2h IV and monitor. K ok without supplementation.   - Consider checking urinary magnesium, though this would not change management at this time.  Troponin elevation: Suspect demand ischemia in setting of  arrhythmia, also suspected to be etiology of lactic acid elevation.  Lactate has cleared, troponin still with elevation as expected with CAD and significant demand. No intervention planned per cardiology. ECG 1/9 read as STEMI, false positive per cardiology. No chest pain.  New onset seizures/seizure-like activity: No intracerebral abnormalities on imaging, EEG the following day is interpreted as a normal EEG in sleeping and awake states.  - Stopped tramadol.  - Since it is first episode and in setting of dehydration, arrhythmia, and EEG normal will not start AED at this time.  - prn ativan  Nausea: Waxing/waning PTA. Has been evaluated by GI as outpatient.  - Antiemetics prn, tolerating diet, advance as tolerated.  Osteoarthritis of left shoulder, chronic pain, s/p right BKA: Normal wound healing. - Seems to be improved on hydrocodone prn with good tolerance. - Orthopedic follow up as scheduled.   IDT2DM: Last HBA1c 8%. Recently increasing CBGs with improved po intake. Alertnative/additional explanation would be undiscovered infection, though this has been treated/prsumptive UTI. - Added AM dose of long-acting insulin, 10u BID with stable fasting CBG at goal. Postprandial values elevated, so increased AC scale with improvement, no hypoglycemia, increase modestly today. I feel adding HS scale would put at risk of hypoglycemia.   Stage III CKD:  - Monitor, avoid nephrotoxins.   HTN:  - Changed norvasc to diltiazem. Low/normal BPs.  OSA:  - Continue CPAP qHS  Hyperthyroidism:  - Continue tapazole. TSH wnl.  Constipation: Resolved 1/11 - Senna scheduled  Depression with adjustment disorder:  - Continue home medications - Needs outpatient therapy  Allergic conjunctivitis:  - Patanol, will add naphazoline-pheneramine prn since symptoms severe.  DVT prophylaxis: Eliquis. Code Status: Full Family Communication: None at bedside Disposition Plan: Uncertain. Remain in 2H due to uncontrolled heart rate/rhythm. Consultants:    Cardiology/EP  Procedures:   Echocardiogram 05/16/2018: - Left ventricle: The cavity size was normal. Wall thickness was   increased in a pattern of severe LVH. Systolic function was   normal. The estimated ejection fraction was in the range of 60%   to 65%. Wall motion was normal; there were no regional wall   motion abnormalities. There was an increased relative   contribution of atrial contraction to ventricular filling.   Doppler parameters are consistent with abnormal left ventricular   relaxation (grade 1 diastolic dysfunction). Doppler parameters   are consistent with high ventricular filling pressure. - Aortic valve: Valve mobility was restricted. There was mild to   moderate stenosis. Mean gradient (S): 17 mm Hg. VTI ratio of LVOT   to aortic valve: 0.36. Valve area (VTI): 1.37 cm^2. Valve area   (Vmax): 1.22 cm^2. Valve area (Vmean): 1.27 cm^2. - Mitral valve: Calcified annulus. Valve area by pressure   half-time: 2.39 cm^2. - Left atrium: The atrium was moderately dilated. - Pulmonary arteries: Systolic pressure could not be accurately   estimated.  Antimicrobials:  Ceftriaxone 1/8 >>   Subjective: Itchy eyes continue, severe, wakes her from sleep, no FB sensation/eye redness, or vision changes. Had large BM yesterday after assistance with disimpaction.  Objective: Vitals:   05/21/18 0700 05/21/18 0800 05/21/18 0810 05/21/18 0900  BP: 122/82 126/90  111/73  Pulse: (!) 124 (!) 127  (!) 134  Resp: 11 17  15   Temp:   (!) 96.7 F (35.9 C)   TempSrc:   Axillary   SpO2: 95% 99%  100%  Weight:      Height:        Intake/Output Summary (Last  24 hours) at 05/21/2018 0954 Last data filed at 05/21/2018 0900 Gross per 24 hour  Intake 1837.78 ml  Output 2300 ml  Net -462.22 ml   Gen: 68 y.o. female in no distress Eyes: Minimal conjunctival redness, no corneal irritation/abrasion noted on gross exam, PERRL.  Pulm: Nonlabored breathing room air. Clear. CV: Regular  tachycardia. No murmur, rub, or gallop. No JVD, no dependent edema. GI: Abdomen soft, non-tender, non-distended, with normoactive bowel sounds.  Ext: Warm, right BKA. Skin: No rashes, lesions or ulcers on visualized skin. Neuro: Alert and oriented. No focal neurological deficits. Psych: Judgement and insight appear fair. Mood euthymic & affect congruent. Behavior is appropriate, pleasant.    Data Reviewed: I have personally reviewed following labs and imaging studies  CBC: Recent Labs  Lab 05/14/18 1052 05/15/18 0215 05/18/18 1637 05/20/18 0444  WBC 12.6* 13.5* 9.2 6.9  NEUTROABS 8.3*  --   --   --   HGB 13.9 12.5 11.3* 10.7*  HCT 45.5 38.0 36.0 33.6*  MCV 88.2 85.4 86.5 88.7  PLT 624* 474* 364 371   Basic Metabolic Panel: Recent Labs  Lab 05/18/18 0420 05/18/18 1637 05/19/18 0549 05/20/18 0444 05/21/18 0247  NA 133* 134* 136 135 134*  K 4.4 4.8 5.0 4.8 4.8  CL 103 104 104 101 104  CO2 21* 21* 22 24 21*  GLUCOSE 264* 160* 129* 132* 148*  BUN 20 17 16 17 21   CREATININE 1.14* 1.03* 1.07* 1.09* 1.14*  CALCIUM 10.0 10.1 9.8 9.9 9.4  MG 1.5* 1.9 1.7 1.5* 1.6*   GFR: Estimated Creatinine Clearance: 47.9 mL/min (A) (by C-G formula based on SCr of 1.14 mg/dL (H)). Liver Function Tests: Recent Labs  Lab 05/14/18 1052  AST 32  ALT 14  ALKPHOS 57  BILITOT 0.6  PROT 8.6*  ALBUMIN 3.6   Recent Labs  Lab 05/14/18 1052  LIPASE 35   No results for input(s): AMMONIA in the last 168 hours. Coagulation Profile: No results for input(s): INR, PROTIME in the last 168 hours. Cardiac Enzymes: Recent Labs  Lab 05/14/18 1446 05/14/18 2046 05/15/18 0215 05/15/18 1755  TROPONINI 0.07* 0.20* 0.68* 1.16*   BNP (last 3 results) No results for input(s): PROBNP in the last 8760 hours. HbA1C: No results for input(s): HGBA1C in the last 72 hours. CBG: Recent Labs  Lab 05/19/18 2141 05/20/18 0653 05/20/18 1201 05/20/18 1607 05/20/18 2123  GLUCAP 218* 141* 207* 165* 225*    Lipid Profile: No results for input(s): CHOL, HDL, LDLCALC, TRIG, CHOLHDL, LDLDIRECT in the last 72 hours. Thyroid Function Tests: No results for input(s): TSH, T4TOTAL, FREET4, T3FREE, THYROIDAB in the last 72 hours. Anemia Panel: No results for input(s): VITAMINB12, FOLATE, FERRITIN, TIBC, IRON, RETICCTPCT in the last 72 hours. Urine analysis:    Component Value Date/Time   COLORURINE YELLOW 05/14/2018 1119   APPEARANCEUR CLOUDY (A) 05/14/2018 1119   LABSPEC 1.017 05/14/2018 1119   PHURINE 5.0 05/14/2018 1119   GLUCOSEU >=500 (A) 05/14/2018 1119   HGBUR SMALL (A) 05/14/2018 1119   BILIRUBINUR NEGATIVE 05/14/2018 1119   BILIRUBINUR negative 07/22/2017 0858   BILIRUBINUR Small 12/06/2014 1441   KETONESUR NEGATIVE 05/14/2018 1119   PROTEINUR 100 (A) 05/14/2018 1119   UROBILINOGEN 0.2 07/22/2017 0858   UROBILINOGEN 1.0 02/12/2015 1238   NITRITE NEGATIVE 05/14/2018 1119   LEUKOCYTESUR LARGE (A) 05/14/2018 1119   Recent Results (from the past 240 hour(s))  Culture, Urine     Status: Abnormal   Collection Time: 05/18/18  3:04  AM  Result Value Ref Range Status   Specimen Description URINE, CLEAN CATCH  Final   Special Requests   Final    NONE Performed at [facility], [address], [place], [place] [address]    Culture 20,000 COLONIES/mL SERRATIA MARCESCENS (A)  Final   Report Status 05/20/2018 FINAL  Final   Organism ID, Bacteria SERRATIA MARCESCENS (A)  Final      Susceptibility   Serratia marcescens - MIC*    CEFAZOLIN >=64 RESISTANT Resistant     CEFTRIAXONE <=1 SENSITIVE Sensitive     CIPROFLOXACIN <=0.25 SENSITIVE Sensitive     GENTAMICIN <=1 SENSITIVE Sensitive     NITROFURANTOIN 256 RESISTANT Resistant     TRIMETH/SULFA <=20 SENSITIVE Sensitive     * 20,000 COLONIES/mL SERRATIA MARCESCENS      Radiology Studies: No results found.  Scheduled Meds: . apixaban  5 mg Oral BID  . atorvastatin  20 mg Oral q1800  . buPROPion  150 mg Oral BID  . diltiazem   120 mg Oral Daily  . FLUoxetine  20 mg Oral QHS  . gabapentin  900 mg Oral BID  . insulin glargine  10 Units Subcutaneous BID  . insulin lispro  4-18 Units Subcutaneous TID WC  . magnesium oxide  400 mg Oral BID  . methimazole  10 mg Oral Daily  . olopatadine  1 drop Both Eyes BID  . pantoprazole  40 mg Oral BID  . senna-docusate  1 tablet Oral BID  . sodium chloride flush  3 mL Intravenous Q12H   Continuous Infusions: . sodium chloride Stopped (05/15/18 1725)  . amiodarone 30 mg/hr (05/21/18 0900)  . magnesium sulfate 1 - 4 g bolus IVPB 2 g (05/21/18 0944)     LOS: 7 days   Time spent: 35 minutes.  Patrecia Pour, MD [facility] [email or web address] Password [facility] 05/21/2018, 9:54 AM

## 2018-05-21 NOTE — Progress Notes (Signed)
Pt has home CPAP at bedside. Family states they will assist in getting set up for the night.  RT will continue to monitor.

## 2018-05-22 ENCOUNTER — Encounter (HOSPITAL_COMMUNITY)
Admission: EM | Disposition: A | Discharge status: Home Health | Payer: Self-pay | Source: Home / Self Care | Attending: Family Medicine

## 2018-05-22 ENCOUNTER — Inpatient Hospital Stay (HOSPITAL_COMMUNITY): Payer: Medicare PPO | Admitting: Certified Registered Nurse Anesthetist

## 2018-05-22 ENCOUNTER — Encounter (HOSPITAL_COMMUNITY): Payer: Self-pay | Admitting: *Deleted

## 2018-05-22 ENCOUNTER — Telehealth (INDEPENDENT_AMBULATORY_CARE_PROVIDER_SITE_OTHER): Payer: Self-pay | Admitting: Orthopedic Surgery

## 2018-05-22 DIAGNOSIS — I25118 Atherosclerotic heart disease of native coronary artery with other forms of angina pectoris: Secondary | ICD-10-CM

## 2018-05-22 HISTORY — PX: CARDIOVERSION: SHX1299

## 2018-05-22 LAB — BASIC METABOLIC PANEL
Anion gap: 6 (ref 5–15)
BUN: 19 mg/dL (ref 8–23)
CO2: 25 mmol/L (ref 22–32)
Calcium: 9.5 mg/dL (ref 8.9–10.3)
Chloride: 104 mmol/L (ref 98–111)
Creatinine, Ser: 1.15 mg/dL — ABNORMAL HIGH (ref 0.44–1.00)
GFR calc Af Amer: 58 mL/min — ABNORMAL LOW (ref >60)
GFR, EST NON AFRICAN AMERICAN: 50 mL/min — AB (ref >60)
Glucose, Bld: 110 mg/dL — ABNORMAL HIGH (ref 70–99)
Potassium: 4.7 mmol/L (ref 3.5–5.1)
SODIUM: 135 mmol/L (ref 135–145)

## 2018-05-22 LAB — GLUCOSE, CAPILLARY
Glucose-Capillary: 101 mg/dL — ABNORMAL HIGH (ref 70–99)
Glucose-Capillary: 86 mg/dL (ref 70–99)
Glucose-Capillary: 184 mg/dL — ABNORMAL HIGH (ref 70–99)
Glucose-Capillary: 318 mg/dL — ABNORMAL HIGH (ref 70–99)

## 2018-05-22 LAB — MAGNESIUM: Magnesium: 2.3 mg/dL (ref 1.7–2.4)

## 2018-05-22 SURGERY — CARDIOVERSION
Anesthesia: General

## 2018-05-22 MED ORDER — SODIUM CHLORIDE 0.9 % IV SOLN
INTRAVENOUS | Status: AC | PRN
  Administered 2018-05-22: 500 mL via INTRAVENOUS

## 2018-05-22 MED ORDER — PROPOFOL 10 MG/ML IV BOLUS
INTRAVENOUS | Status: DC | PRN
  Administered 2018-05-22: 60 mg via INTRAVENOUS

## 2018-05-22 MED ORDER — SODIUM CHLORIDE 0.9 % IV SOLN
INTRAVENOUS | Status: DC | PRN
  Administered 2018-05-22: 12:00:00 via INTRAVENOUS

## 2018-05-22 MED ORDER — LIDOCAINE 2% (20 MG/ML) 5 ML SYRINGE
INTRAMUSCULAR | Status: DC | PRN
  Administered 2018-05-22: 40 mg via INTRAVENOUS

## 2018-05-22 NOTE — Telephone Encounter (Signed)
Can we schedule her with Sharol Given or [employer]

## 2018-05-22 NOTE — Telephone Encounter (Signed)
Patient is scheduled 05/26/18 at 9:00am with Shawn. Patient is currently in the hospital and may be released tomorrow

## 2018-05-22 NOTE — Anesthesia Postprocedure Evaluation (Signed)
Anesthesia Post Note  Patient: Judith [name]  Procedure(s) Performed: CARDIOVERSION (N/A )     Patient location during evaluation: PACU Anesthesia Type: General Level of consciousness: awake and alert Pain management: pain level controlled Vital Signs Assessment: post-procedure vital signs reviewed and stable Respiratory status: spontaneous breathing, nonlabored ventilation, respiratory function stable and patient connected to nasal cannula oxygen Cardiovascular status: blood pressure returned to baseline and stable Postop Assessment: no apparent nausea or vomiting Anesthetic complications: no    Last Vitals:  Vitals:   05/22/18 1257 05/22/18 1316  BP: 123/60 (!) 127/93  Pulse:  78  Resp:  17  Temp:    SpO2:  93%    Last Pain:  Vitals:   05/22/18 1248  TempSrc: Oral  PainSc:                  Effie Berkshire

## 2018-05-22 NOTE — Interval H&P Note (Signed)
History and Physical Interval Note:  05/22/2018 12:31 PM  Judith [name]  has presented today for surgery, with the diagnosis of atrial fibrillation  The various methods of treatment have been discussed with the patient and family. After consideration of risks, benefits and other options for treatment, the patient has consented to  Procedure(s): CARDIOVERSION (N/A) as a surgical intervention .  The patient's history has been reviewed, patient examined, no change in status, stable for surgery.  I have reviewed the patient's chart and labs.  Questions were answered to the patient's satisfaction.     Skeet Latch, MD

## 2018-05-22 NOTE — Consult Note (Signed)
   [facility] CM Inpatient Consult   05/22/2018  [name] [birth year] [id number]  Patient screened for high risk score for unplanned readmissions and less than 30 day readmission with [facility]. Patient is currently post procedure.  Spoke with inpatient RNCM to let know patient can be followed if needs arise.  Patient is currently resting from procedure.  Will follow as needed.  Please place a [facility] Care Management consult or for questions contact:   Natividad Brood, RN BSN [facility] Liaison  [phone] business mobile phone Toll free office [phone]

## 2018-05-22 NOTE — Telephone Encounter (Signed)
Noted  

## 2018-05-22 NOTE — Progress Notes (Signed)
Occupational Therapy Treatment Patient Details Name: Judith [name] MRN: [id number] DOB: 1951-01-23 Today's Date: 05/22/2018    History of present illness Pt is a 68 y.o. F with significant PMH of right transtibial amputation, chronic kidney disease, CAD, diabetes mellitus with peripheral neuropathy, paroxysmal atrial fibrillation, who presents after an unresponsive episode. She was found to have wide complex tachycardia and converted to Afib with RVR.   OT comments  Pt performing bed mobility with MinA; squat pivot transfer to recliner. Pt performing lgith ADLs at sink in standing x3 mins with minguardA. Pt tolerating session well. Pain in RLE noted from positioning in bed last night. Pt minA overall for ADLs. Pt living with sister and plans to resume care there with [facility] therapy. Pt continues to be limited by inability to hop on LLE with RW safely so pt prefers w/c transfers at this time. Pt would benefit from continued OT skilled services for ADL, mobility and safety in [place] setting.   Follow Up Recommendations  Home health OT    Equipment Recommendations  None recommended by OT    Recommendations for Other Services      Precautions / Restrictions Precautions Precautions: Fall Precaution Comments: right BKA Restrictions Weight Bearing Restrictions: Yes RLE Weight Bearing: Non weight bearing       Mobility Bed Mobility Overal bed mobility: Needs Assistance Bed Mobility: Supine to Sit     Supine to sit: Mod assist     General bed mobility comments: OOB in recliner  Transfers Overall transfer level: Needs assistance Equipment used: None Transfers: Sit to/from Stand           General transfer comment: Min assist to steady. Cues for hand positioning    Balance Overall balance assessment: Needs assistance Sitting-balance support: Feet unsupported Sitting balance-Leahy Scale: Fair       Standing balance-Leahy Scale: Poor                              ADL either performed or assessed with clinical judgement   ADL Overall ADL's : Needs assistance/impaired     Grooming: Set up;Sitting                               Functional mobility during ADLs: Min guard(rolling chair to sink due to instability hopping with RW) General ADL Comments: requires set-upA for UB and modA for LB     Vision   Vision Assessment?: No apparent visual deficits   Perception     Praxis      Cognition Arousal/Alertness: Awake/alert Behavior During Therapy: WFL for tasks assessed/performed Overall Cognitive Status: Within Functional Limits for tasks assessed Area of Impairment: Memory                               General Comments: Very pleasant and eager to participate        Exercises     Shoulder Instructions       General Comments tightness in RLE as cords were making leg sore    Pertinent Vitals/ Pain       Pain Assessment: No/denies pain  Home Living  Prior Functioning/Environment              Frequency  Min 2X/week        Progress Toward Goals  OT Goals(current goals can now be found in the care plan section)  Progress towards OT goals: Progressing toward goals  Acute Rehab OT Goals Patient Stated Goal: "Get stronger." OT Goal Formulation: With patient Time For Goal Achievement: 05/24/18 Potential to Achieve Goals: Good ADL Goals Pt Will Perform Lower Body Bathing: with modified independence;sitting/lateral leans Pt Will Perform Lower Body Dressing: with modified independence;sitting/lateral leans Pt Will Transfer to Toilet: with modified independence;squat pivot transfer Pt Will Perform Toileting - Clothing Manipulation and hygiene: with modified independence;sitting/lateral leans  Plan Discharge plan remains appropriate    Co-evaluation    PT/OT/SLP Co-Evaluation/Treatment: Yes Reason for Co-Treatment: Complexity of the  patient's impairments (multi-system involvement);To address functional/ADL transfers   OT goals addressed during session: ADL's and self-care      AM-PAC OT "6 Clicks" Daily Activity     Outcome Measure   Help from another person eating meals?: None Help from another person taking care of personal grooming?: None Help from another person toileting, which includes using toliet, bedpan, or urinal?: A Little Help from another person bathing (including washing, rinsing, drying)?: A Little Help from another person to put on and taking off regular upper body clothing?: None Help from another person to put on and taking off regular lower body clothing?: A Little 6 Click Score: 21    End of Session Equipment Utilized During Treatment: Gait belt  OT Visit Diagnosis: Unsteadiness on feet (R26.81)   Activity Tolerance Patient tolerated treatment well   Patient Left in chair;with call bell/phone within reach;with chair alarm set;with nursing/sitter in room   Nurse Communication Mobility status        Time: 2751-7001 OT Time Calculation (min): 24 min  Charges: OT General Charges $OT Visit: 1 Visit OT Treatments $Self Care/Home Management : 8-22 mins  Darryl Nestle) Marsa Aris OTR/L Acute Rehabilitation Services Pager: [phone] Office: [phone]    Fredda Hammed 05/22/2018, 1:59 PM

## 2018-05-22 NOTE — Telephone Encounter (Signed)
See below

## 2018-05-22 NOTE — Progress Notes (Signed)
PROGRESS NOTE  Judith [name]  VOH:[id number] DOB: 1950/10/30 DOA: 05/14/2018 PCP: Rutherford Guys, MD  Brief Narrative: Judith [name] is a 68 y.o. female with a history of AFib, VT, stage III CKD, recent right BKA, and T2DM who presented to the ED with an unresponsive episode. She is living with her sister since undergoing right BKA recently and has been failing to thrive generally, eating less, getting up less, but on 1/5 noted palpitations typical of previous episodes of AFib with RVR. Her sister drove her to the ED when en route the patient had 2-3 minutes of tensing bilateral arms, being unresponsive with eyes rolled in the back of her head, maintaining postural tone in the seat, then began moving all limbs, bumping the BKA stump against the dashboard which would normally cause excruciating pain though didn't seem to cause pain. On arrival she was found to have a wide complex tachycardia, given amiodarone bolus and metoprolol with conversion to narrow AFib with RVR. Mental status had improved when the patient had another episode lasting about 3 minutes terminated with ativan 2mg  IV, postictal-appearing thereafter. Mental status has waxed and waned consistent with delirium, but has largely returned to baseline. Nausea has resolved and she's eating a normal diet, has had no further seizure-like activity. Heart rhythm converted to NSR overnight into 05/15/2018, though with transition to po amiodarone 1/8, went back into AFib/aflutter prompting restart of amiodarone infusion. Has remained in rapid atrial flutter for several days. If continues, planning cardioversion 1/13.  Assessment & Plan: Principal Problem:   V-tach [facility]) Active Problems:   DM (diabetes mellitus), type 2 with peripheral vascular complications (HCC)   HTN (hypertension)   Memory loss   Atrial fibrillation with rapid ventricular response (HCC)   Atrial fibrillation with RVR (HCC)   Chronic combined systolic and diastolic congestive  heart failure (HCC)   CKD (chronic kidney disease), stage III (HCC)   Seizure (HCC)  Acute metabolic encephalopathy: Due to postictal state, possibly related to arrhythmia, also likely an element of acute delirium as she's struggled with that in the hospital in the past. Also has had subacute decline in mood, appetite, etc. following BKA, so suspect depression also contributing on longer time scale.  - Delirium precautions - Continue holding sedating medications as able. Do not want to precipitate gabapentin withdrawal, so restarted this at previous dose. No longer sedated, so will not further lower dose. - Treated UTI as below  UTI: Pyuria on UA. Also w/leukocytosis. Unclear whether this is contributing to AMS and/or arrhythmia. Serratia on urine culture, sensitive to CTX. No leukocytosis, afebrile.  - Started CTX, will complete 3 days today.  Wide complex tachycardia, possibly ventricular tachycardia, though cardiology favors persistent AFib with RVR, AFlutter: Echocardiogram with EF 60-65%, G1DD, no regional WMA. mild-mod AS.  - Failed to improve despite days of amiodarone infusion, diltiazem. Underwent DCCV x1 05/22/2018 with conversion to NSR currently. Continue cardiac monitoring. Now on po amiodarone and diltiazem - CHA2DS2-VASc score is 5, restarted eliquis.   Recurrent hypomagnesemia: ?long-standing insufficient intake. Gitelman/bartter unlikely without other constellation of derangements/alkalemia. DM controlled, not renal transplant patient, and not on loop/thiazides. - Started daily po supplement 400mg  po BID with improvement. No further supplementation required currently.   Troponin elevation: Suspect demand ischemia in setting of arrhythmia, also suspected to be etiology of lactic acid elevation. Lactate has cleared, troponin still with elevation as expected with CAD and significant demand. No intervention planned per cardiology. ECG 1/9 read as STEMI, false  positive per cardiology.  No chest pain.  New onset seizures/seizure-like activity: No intracerebral abnormalities on imaging, EEG the following day is interpreted as a normal EEG in sleeping and awake states.  - Stopped tramadol.  - Since it is first episode and in setting of dehydration, arrhythmia, and EEG normal will not start AED at this time.  - prn ativan  Nausea: Waxing/waning PTA. Has been evaluated by GI as outpatient. Has now resolved. - Antiemetics prn, tolerating diet, advance as tolerated.  Osteoarthritis of left shoulder, chronic pain, s/p right BKA: Normal wound healing. - Seems to be improved on hydrocodone prn with good tolerance. - Orthopedic follow up as scheduled.   IDT2DM: Last HBA1c 8%. Recently increasing CBGs with improved po intake. Alertnative/additional explanation would be undiscovered infection, though this has been treated/prsumptive UTI. - Added AM dose of long-acting insulin, 10u BID with stable fasting CBG at goal. Postprandial values elevated, so increased AC scale with improvement, no hypoglycemia, continue to monitor today. I feel adding HS scale would put at risk of hypoglycemia. Will need close monitoring at follow up.  Stage III CKD:  - Monitor, avoid nephrotoxins.   HTN:  - Changed norvasc to diltiazem. Low/normal BPs.  OSA:  - Continue CPAP qHS  Hyperthyroidism:  - Continue tapazole. TSH wnl.  Constipation: Resolved 1/11 - Senna scheduled  Depression with adjustment disorder:  - Continue home medications - Needs outpatient therapy  Allergic conjunctivitis:  - Patanol, added naphazoline-pheneramine prn since symptoms severe.  DVT prophylaxis: Eliquis. Code Status: Full Family Communication: None at bedside Disposition Plan: Likely home with home health PT and OT if maintains sinus rhythm/clinical stability through 1/14. Consultants:   Cardiology/EP  Procedures:   Echocardiogram 05/16/2018: - Left ventricle: The cavity size was normal. Wall thickness  was   increased in a pattern of severe LVH. Systolic function was   normal. The estimated ejection fraction was in the range of 60%   to 65%. Wall motion was normal; there were no regional wall   motion abnormalities. There was an increased relative   contribution of atrial contraction to ventricular filling.   Doppler parameters are consistent with abnormal left ventricular   relaxation (grade 1 diastolic dysfunction). Doppler parameters   are consistent with high ventricular filling pressure. - Aortic valve: Valve mobility was restricted. There was mild to   moderate stenosis. Mean gradient (S): 17 mm Hg. VTI ratio of LVOT   to aortic valve: 0.36. Valve area (VTI): 1.37 cm^2. Valve area   (Vmax): 1.22 cm^2. Valve area (Vmean): 1.27 cm^2. - Mitral valve: Calcified annulus. Valve area by pressure   half-time: 2.39 cm^2. - Left atrium: The atrium was moderately dilated. - Pulmonary arteries: Systolic pressure could not be accurately   estimated.  Procedure: Electrical Cardioversion Indications:  Atrial Flutter  Procedure Details Consent: Risks of procedure as well as the alternatives and risks of each were explained to the (patient/caregiver).  Consent for procedure obtained. Time Out: Verified patient identification, verified procedure, site/side was marked, verified correct patient position, special equipment/implants available, medications/allergies/relevent history reviewed, required imaging and test results available.  Performed  Patient placed on cardiac monitor, pulse oximetry, supplemental oxygen as necessary.  Sedation given: propofol Pacer pads placed anterior and posterior chest.  Cardioverted 1 time(s).  Cardioverted at 150J.  Evaluation Findings: Post procedure EKG shows: NSR Complications: None Patient did tolerate procedure well.   Skeet Latch, MD 05/22/2018, 12:35 PM  Antimicrobials:  Ceftriaxone 1/8 - 1/10  Subjective: +Palpitations when  ambulating, but gaining strength. No chest pain or dyspnea.   Objective: Vitals:   05/22/18 1246 05/22/18 1248 05/22/18 1257 05/22/18 1316  BP: 111/61  123/60 (!) 127/93  Pulse: 78   78  Resp: 15   17  Temp:  98.9 F (37.2 C)    TempSrc:  Oral    SpO2: 98%   93%  Weight:      Height:        Intake/Output Summary (Last 24 hours) at 05/22/2018 1446 Last data filed at 05/22/2018 1257 Gross per 24 hour  Intake 784.71 ml  Output 1200 ml  Net -415.29 ml   Gen: 68 y.o. female in no distress HEENT: No conjunctival injection Pulm: Nonlabored breathing room air. Clear. CV: Regular rate and rhythm. No murmur, rub, or gallop. No JVD, no dependent edema. GI: Abdomen soft, non-tender, non-distended, with normoactive bowel sounds.  Ext: Warm, right BKA Skin: No new rashes, lesions or ulcers on visualized skin. Neuro: Alert and oriented. No focal neurological deficits. Psych: Judgement and insight appear fair. Mood euthymic & affect congruent. Behavior is appropriate.    Data Reviewed: I have personally reviewed following labs and imaging studies  CBC: Recent Labs  Lab 05/18/18 1637 05/20/18 0444  WBC 9.2 6.9  HGB 11.3* 10.7*  HCT 36.0 33.6*  MCV 86.5 88.7  PLT 364 767   Basic Metabolic Panel: Recent Labs  Lab 05/18/18 1637 05/19/18 0549 05/20/18 0444 05/21/18 0247 05/22/18 0359  NA 134* 136 135 134* 135  K 4.8 5.0 4.8 4.8 4.7  CL 104 104 101 104 104  CO2 21* 22 24 21* 25  GLUCOSE 160* 129* 132* 148* 110*  BUN 17 16 17 21 19   CREATININE 1.03* 1.07* 1.09* 1.14* 1.15*  CALCIUM 10.1 9.8 9.9 9.4 9.5  MG 1.9 1.7 1.5* 1.6* 2.3   GFR: Estimated Creatinine Clearance: 47.1 mL/min (A) (by C-G formula based on SCr of 1.15 mg/dL (H)). Liver Function Tests: No results for input(s): AST, ALT, ALKPHOS, BILITOT, PROT, ALBUMIN in the last 168 hours. No results for input(s): LIPASE, AMYLASE in the last 168 hours. No results for input(s): AMMONIA in the last 168 hours. Coagulation  Profile: No results for input(s): INR, PROTIME in the last 168 hours. Cardiac Enzymes: Recent Labs  Lab 05/15/18 1755  TROPONINI 1.16*   BNP (last 3 results) No results for input(s): PROBNP in the last 8760 hours. HbA1C: No results for input(s): HGBA1C in the last 72 hours. CBG: Recent Labs  Lab 05/21/18 1147 05/21/18 1738 05/21/18 2137 05/22/18 0754 05/22/18 1318  GLUCAP 122* 234* 297* 101* 86   Lipid Profile: No results for input(s): CHOL, HDL, LDLCALC, TRIG, CHOLHDL, LDLDIRECT in the last 72 hours. Thyroid Function Tests: No results for input(s): TSH, T4TOTAL, FREET4, T3FREE, THYROIDAB in the last 72 hours. Anemia Panel: No results for input(s): VITAMINB12, FOLATE, FERRITIN, TIBC, IRON, RETICCTPCT in the last 72 hours. Urine analysis:    Component Value Date/Time   COLORURINE YELLOW 05/14/2018 1119   APPEARANCEUR CLOUDY (A) 05/14/2018 1119   LABSPEC 1.017 05/14/2018 1119   PHURINE 5.0 05/14/2018 1119   GLUCOSEU >=500 (A) 05/14/2018 1119   HGBUR SMALL (A) 05/14/2018 1119   BILIRUBINUR NEGATIVE 05/14/2018 1119   BILIRUBINUR negative 07/22/2017 0858   BILIRUBINUR Small 12/06/2014 1441   KETONESUR NEGATIVE 05/14/2018 1119   PROTEINUR 100 (A) 05/14/2018 1119   UROBILINOGEN 0.2 07/22/2017 0858   UROBILINOGEN 1.0 02/12/2015 1238   NITRITE NEGATIVE 05/14/2018 1119   LEUKOCYTESUR LARGE (A)  05/14/2018 1119   Recent Results (from the past 240 hour(s))  Culture, Urine     Status: Abnormal   Collection Time: 05/18/18  3:04 AM  Result Value Ref Range Status   Specimen Description URINE, CLEAN CATCH  Final   Special Requests   Final    NONE Performed at [facility], [place] [address], [place], [place] [address]    Culture 20,000 COLONIES/mL SERRATIA MARCESCENS (A)  Final   Report Status 05/20/2018 FINAL  Final   Organism ID, Bacteria SERRATIA MARCESCENS (A)  Final      Susceptibility   Serratia marcescens - MIC*    CEFAZOLIN >=64 RESISTANT Resistant      CEFTRIAXONE <=1 SENSITIVE Sensitive     CIPROFLOXACIN <=0.25 SENSITIVE Sensitive     GENTAMICIN <=1 SENSITIVE Sensitive     NITROFURANTOIN 256 RESISTANT Resistant     TRIMETH/SULFA <=20 SENSITIVE Sensitive     * 20,000 COLONIES/mL SERRATIA MARCESCENS      Radiology Studies: No results found.  Scheduled Meds: . amiodarone  200 mg Oral BID  . apixaban  5 mg Oral BID  . atorvastatin  20 mg Oral q1800  . buPROPion  150 mg Oral BID  . diltiazem  120 mg Oral Daily  . FLUoxetine  20 mg Oral QHS  . gabapentin  900 mg Oral BID  . insulin glargine  10 Units Subcutaneous BID  . insulin lispro  4-18 Units Subcutaneous TID WC  . magnesium oxide  400 mg Oral BID  . methimazole  10 mg Oral Daily  . olopatadine  1 drop Both Eyes BID  . senna-docusate  1 tablet Oral Daily  . sodium chloride flush  3 mL Intravenous Q12H   Continuous Infusions: . sodium chloride Stopped (05/21/18 2300)     LOS: 8 days   Time spent: 25 minutes.  Patrecia Pour, MD [facility] [email or web address] Password [facility] 05/22/2018, 2:46 PM

## 2018-05-22 NOTE — Anesthesia Preprocedure Evaluation (Signed)
Anesthesia Evaluation  Patient identified by MRN, date of birth, ID band Patient awake    Airway Mallampati: I  TM Distance: >3 FB Neck ROM: Full    Dental  (+) Edentulous Upper, Missing,    Pulmonary asthma , sleep apnea and Continuous Positive Airway Pressure Ventilation , former smoker,    Pulmonary exam normal        Cardiovascular hypertension, + CAD, + Cardiac Stents and +CHF  + dysrhythmias Atrial Fibrillation  Rhythm:Irregular Rate:Abnormal     Neuro/Psych  Headaches, Seizures -,  Anxiety Depression  Neuromuscular disease    GI/Hepatic Neg liver ROS, PUD, GERD  ,  Endo/Other  diabetes, Type 2, Insulin DependentHyperthyroidism   Renal/GU      Musculoskeletal  (+) Arthritis ,   Abdominal Normal abdominal exam  (+)   Peds  Hematology   Anesthesia Other Findings - HLD  Reproductive/Obstetrics                             Anesthesia Physical Anesthesia Plan  ASA: III  Anesthesia Plan: General   Post-op Pain Management:    Induction:   PONV Risk Score and Plan:   Airway Management Planned: Natural Airway and Mask  Additional Equipment: None  Intra-op Plan:   Post-operative Plan:   Informed Consent: I have reviewed the patients History and Physical, chart, labs and discussed the procedure including the risks, benefits and alternatives for the proposed anesthesia with the patient or authorized representative who has indicated his/her understanding and acceptance.     Plan Discussed with:   Anesthesia Plan Comments:         Anesthesia Quick Evaluation

## 2018-05-22 NOTE — Telephone Encounter (Signed)
Patient left a message stating that the leg Dr. Sharol Given amputated was numb all the way up to the hip.  CB#[phone].  Thank you.

## 2018-05-22 NOTE — Transfer of Care (Signed)
Immediate Anesthesia Transfer of Care Note  Patient: Judith [name]  Procedure(s) Performed: CARDIOVERSION (N/A )  Patient Location: Endoscopy Unit  Anesthesia Type:General  Level of Consciousness: drowsy and patient cooperative  Airway & Oxygen Therapy: Patient Spontanous Breathing  Post-op Assessment: Report given to RN and Post -op Vital signs reviewed and stable  Post vital signs: Reviewed and stable  Last Vitals:  Vitals Value Taken Time  BP    Temp    Pulse 74 05/22/2018 12:40 PM  Resp 13 05/22/2018 12:40 PM  SpO2 94 % 05/22/2018 12:40 PM  Vitals shown include unvalidated device data.  Last Pain:  Vitals:   05/22/18 1203  TempSrc:   PainSc: 7       Patients Stated Pain Goal: 7 (70/11/00 3496)  Complications: No apparent anesthesia complications

## 2018-05-22 NOTE — CV Procedure (Signed)
Electrical Cardioversion Procedure Note TRINKA KESHISHYAN [id number] 1951/02/13  Procedure: Electrical Cardioversion Indications:  Atrial Flutter  Procedure Details Consent: Risks of procedure as well as the alternatives and risks of each were explained to the (patient/caregiver).  Consent for procedure obtained. Time Out: Verified patient identification, verified procedure, site/side was marked, verified correct patient position, special equipment/implants available, medications/allergies/relevent history reviewed, required imaging and test results available.  Performed  Patient placed on cardiac monitor, pulse oximetry, supplemental oxygen as necessary.  Sedation given: propofol Pacer pads placed anterior and posterior chest.  Cardioverted 1 time(s).  Cardioverted at 150J.  Evaluation Findings: Post procedure EKG shows: NSR Complications: None Patient did tolerate procedure well.   Skeet Latch, MD 05/22/2018, 12:35 PM

## 2018-05-22 NOTE — Telephone Encounter (Signed)
Would have her come in to be seen, may be coming from her back

## 2018-05-22 NOTE — Progress Notes (Addendum)
Progress Note  Patient Name: Judith [name] Date of Encounter: 05/22/2018  Primary Cardiologist: Fransico Him, MD   Subjective   Slept well last night, no CP or SOB, no palpitations  Inpatient Medications    Scheduled Meds: . amiodarone  200 mg Oral BID  . apixaban  5 mg Oral BID  . atorvastatin  20 mg Oral q1800  . buPROPion  150 mg Oral BID  . diltiazem  120 mg Oral Daily  . FLUoxetine  20 mg Oral QHS  . gabapentin  900 mg Oral BID  . insulin glargine  10 Units Subcutaneous BID  . insulin lispro  4-18 Units Subcutaneous TID WC  . magnesium oxide  400 mg Oral BID  . methimazole  10 mg Oral Daily  . olopatadine  1 drop Both Eyes BID  . senna-docusate  1 tablet Oral Daily  . sodium chloride flush  3 mL Intravenous Q12H   Continuous Infusions: . sodium chloride Stopped (05/21/18 2300)   PRN Meds: sodium chloride, HYDROcodone-acetaminophen, levalbuterol, naphazoline-pheniramine, ondansetron **OR** ondansetron (ZOFRAN) IV, polyethylene glycol, sodium chloride flush   Vital Signs    Vitals:   05/21/18 1300 05/21/18 1457 05/21/18 2043 05/22/18 0248  BP: 123/86 116/77 (!) 135/96 94/66  Pulse:  (!) 126 (!) 123 (!) 121  Resp: _0 Temp:  98.7 F (37.1 C) 98.3 F (36.8 C)   TempSrc:  Oral Oral   SpO2:  100% 99% 99%  Weight:    71.3 kg  Height:        Intake/Output Summary (Last 24 hours) at 05/22/2018 0706 Last data filed at 05/22/2018 0602 Gross per 24 hour  Intake 1129.44 ml  Output 1700 ml  Net -570.56 ml   Filed Weights   05/20/18 1300 05/22/18 0248  Weight: 72.1 kg 71.3 kg    Telemetry    AFlutter, 120's - Personally Reviewed  ECG    No new EKGs - Personally Reviewed   Physical Exam   Exam remains unchanged  GEN: No acute distress, looks older then her age, chronically ill in appearance.   Neck: No JVD Cardiac: tachycardic, no murmurs, rubs, or gallops.  Respiratory: CTA b/l. GI: Soft, nontender, non-distended  MS: No edema LLE s/p R  BKA. Neuro:  Nonfocal  Psych: Normal affect   Labs    Chemistry Recent Labs  Lab 05/20/18 0444 05/21/18 0247 05/22/18 0359  NA 135 134* 135  K 4.8 4.8 4.7  CL 101 104 104  CO2 24 21* 25  GLUCOSE 132* 148* 110*  BUN _1 CREATININE 1.09* 1.14* 1.15*  CALCIUM 9.9 9.4 9.5  GFRNONAA 53* NOT CALCULATED 50*  GFRAA >60 NOT CALCULATED 58*  ANIONGAP _2 Hematology Recent Labs  Lab 05/18/18 1637 05/20/18 0444  WBC 9.2 6.9  RBC 4.16 3.79*  HGB 11.3* 10.7*  HCT 36.0 33.6*  MCV 86.5 88.7  MCH 27.2 28.2  MCHC 31.4 31.8  RDW 13.7 13.9  PLT 364 362    Cardiac Enzymes Recent Labs  Lab 05/15/18 1755  TROPONINI 1.16*    No results for input(s): TROPIPOC in the last 168 hours.   BNPNo results for input(s): BNP, PROBNP in the last 168 hours.   DDimer No results for input(s): DDIMER in the last 168 hours.   Radiology      Cardiac Studies   Echo is completed, pending read  01/03/18: TTE Study Conclusions - Procedure narrative: Transthoracic echocardiography.  Image quality was adequate. The study was technically difficult. - Left ventricle: The cavity size was normal. Wall thickness was increased in a pattern of mild LVH. Systolic function was normal. The estimated ejection fraction was in the range of 55% to 60%. Possible mild anterolateral hypokinesis. Features are consistent with a pseudonormal left ventricular filling pattern, with concomitant abnormal relaxation and increased filling pressure (grade 2 diastolic dysfunction). - Aortic valve: Trileaflet; moderately calcified leaflets. There was moderate stenosis. Mean gradient (S): 19 mm Hg. Peak gradient (S): 30 mm Hg. Valve area (VTI): 1.16 cm^2. - Mitral valve: Moderately calcified annulus. There was mild regurgitation. - Left atrium: The atrium was mildly dilated. - Right ventricle: The cavity size was normal. Systolic function was normal. - Pulmonary arteries: No  complete TR doppler jet so unable to estimate PA systolic pressure. - Systemic veins: IVC measured 2.6 cm with < 50% respirophasic variation, suggesting RA pressure 15 mmHg. - Pericardium, extracardiac: A trivial pericardial effusion was identified. Impressions - Normal LV size with mild LV hypertrophy. EF 55-60% with possible mild anterolateral hypokinesis. Moderate diastolic dysfunction. Normal RV size and systolic function. Mild MR. Moderate aortic stenosis. Dilated IVC suggestive of elevated RV filling pressure.   03/06/15: LHC/PCI Left Anterior Descending  Prox LAD to Mid LAD lesion 40% stenosed  calcified diffuse.  Left Circumflex  Mid Cx lesion 80% stenosed  calcified.  Second Obtuse Marginal Branch  Ost 2nd Mrg to 2nd Mrg lesion 70% stenosed  calcified diffuse.  Right Coronary Artery  Collaterals  Dist RCA filled by collaterals from Dist LAD.    Prox RCA lesion 100% stenosed  calcified diffuse.  Intervention   Mid Cx lesion  Angioplasty  Pre-stent angioplasty was performed. A drug-eluting stent was placed. A post-stent angioplasty was not performed. Maximum pressure: 16 atm. The pre-interventional distal flow is normal (TIMI 3). The post-interventional distal flow is normal (TIMI 3). The intervention was successful. No complications occured at this lesion. IC nitroglycerin was given. The mid circumflex/obtuse marginal bifurcation is treated. There is complex bifurcation stenosis present. I planned on using a bifurcation stenting technique. Angiomax is used for anticoagulation. The patient is adequately preloaded with aspirin and Plavix. A 7 French XB 3.5 cm guide catheter was utilized. Once a therapeutic ACT was achieved, a cougar guidewire is advanced into the OM. A second cougar guidewire is advanced into the distal circumflex. The obtuse marginal branches predilated with a 2.5 x 20 mm noncompliant balloon. The circumflex is then predilated with a 3.0 mm  balloon. With the balloon present in the circumflex, a 2.5 x 20 mm Promus DES his advanced into the OM branch. The stent is deployed so that the ostium is covered. Simultaneously, the circumflex balloon is dilated in order to keep the circumflex portion patent. The circumflex stent is then advanced so that it lays across the OM origin. A 3.5 x 12 mm Promus DES was chosen. This stent is deployed at 14 atm. I tried to rewire the OM branch but was unsuccessful. I decided to post dilate the circumflex stent in order to open the cells better. A 4.0 mm noncompliant balloon is chosen. It is dilated to 16 atm. I was then able to wire the obtuse marginal branch with a whisper wire. The OM stent is postdilated with a 2.5 mm noncompliant balloon. A final kissing balloon is done with the 4.0 mm balloon and the circumflex and the 2.5 mm balloon in the obtuse marginal. At the completion of the procedure there   is 0% residual stenosis in the circumflex and 20% residual stenosis at the obtuse marginal origin. There is TIMI-3 flow into both vessels.  There is a 0% residual stenosis post intervention.  Ost 2nd Mrg to 2nd Mrg lesion  Angioplasty  Pre-stent angioplasty was performed. A drug-eluting stent was placed. A post-stent angioplasty was not performed. The pre-interventional distal flow is normal (TIMI 3). The post-interventional distal flow is normal (TIMI 3). The intervention was successful. No complications occured at this lesion. IC nitroglycerin was given. See description under LCx lesion, bifurcation stenting technique utilized  There is a 20% residual stenosis post intervention.      Patient Profile     67 y.o. 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  Assessment & Plan     1. Seizure vs syncope 2. WCT     This only occured when in AFib/flutter, often was irregular (some more regular)      Felt to represent BBB/Ashman's, given only occurred when in AFib, none with SR      Continue correction of electrolytes, keep Mag >2.0 and K+ better then 4.0 (medicine team addressing)      No sotalol going forward      Continue amiodarone >> PO last night   3. Persistent AFib, has been in A flutter now several days     CHA2DS2Vasc is 5, on Eliquis has been resumed appropriately dosed     she remains in AFlutter 120's       She remains completely asymptomatic from this, BP is ok      The patient had been off her meds at home 2-3 days PTA Arrived in rapid AFib >> converted to SR 1/6 Eliquis resumed 05/16/18 (given 2 doses) and maintained on since without missed doses Back in AFib /flutter on 05/17/18 @ about 0700  Planned for DCCV today, I think she will be OK for DCCV from a/c perspective given she was back on Eliquis when she went back into AF I have made Dr. Allred aware He will see her this AM  In discussion, once DCCV would be OK from us to go home, I will make AFib clinic follow up       4. CAD     Mild abnormal Trop, no ischemic EKG changes appreciated, likely demand     She has not had any cardiac awareness of late, no CP of any kind     Remains without complaints     No active complaints of any cardiac sounding etiology           For questions or updates, please contact [facility] Please consult [email or web address] for contact info under        Signed, Renee Lynn Ursuy, PA-C  05/22/2018, 7:06 AM     I have seen, examined the patient, and reviewed the above assessment and plan.  Changes to above are made where necessary.  On exam, iRRR.  Anticipate cardioversion today.  Hopefully home this afternoon.  OK to discharge from EP standpoint post cardioversion with close follow-up in AF  clinic.  Co Sign: James Allred, MD 05/22/2018    

## 2018-05-22 NOTE — Progress Notes (Signed)
Physical Therapy Treatment Patient Details Name: Judith [name] MRN: [id number] DOB: 1951/01/28 Today's Date: 05/22/2018    History of Present Illness Pt is a 68 y.o. F with significant PMH of right transtibial amputation, chronic kidney disease, CAD, diabetes mellitus with peripheral neuropathy, paroxysmal atrial fibrillation, who presents after an unresponsive episode. She was found to have wide complex tachycardia and converted to Afib with RVR. Going for cardioversion 05/22/18.     PT Comments    Pt admitted with above diagnosis. Pt currently with functional limitations due to the deficits listed below (see PT Problem List). Pt was able to pivot to chair with min guard assist to min assist.  Able to stand at sink and brush teeth with min guard assist.  Limited somewhat due to incr HR and cardioversion later today.  Will continue to follow acutely.   Pt will benefit from skilled PT to increase their independence and safety with mobility to allow discharge to the venue listed below.     Follow Up Recommendations  Home health PT;Supervision/Assistance - 24 hour     Equipment Recommendations  None recommended by PT    Recommendations for Other Services       Precautions / Restrictions Precautions Precautions: Fall Precaution Comments: right BKA Required Braces or Orthoses: Other Brace Other Brace: limb guard Restrictions Weight Bearing Restrictions: Yes RLE Weight Bearing: Non weight bearing    Mobility  Bed Mobility Overal bed mobility: Needs Assistance Bed Mobility: Supine to Sit     Supine to sit: Min assist     General bed mobility comments: OOB in recliner  Transfers Overall transfer level: Needs assistance Equipment used: None Transfers: Sit to/from Stand     Squat pivot transfers: Min assist     General transfer comment: Min assist to steady. Cues for hand positioning.  took pt incr time to get set up for transfer  Pt HR fluctuating up to 130 bpm at times  therefore did not push pt too much as she is going for cardioversion this pm.   Ambulation/Gait                 Stairs             Wheelchair Mobility    Modified Rankin (Stroke Patients Only)       Balance Overall balance assessment: Needs assistance Sitting-balance support: Feet unsupported;No upper extremity supported Sitting balance-Leahy Scale: Fair     Standing balance support: Bilateral upper extremity supported;During functional activity Standing balance-Leahy Scale: Poor Standing balance comment: Stood at sink and brushed teeth with one UE support for about 3 minutes.  No LOB.                             Cognition Arousal/Alertness: Awake/alert Behavior During Therapy: WFL for tasks assessed/performed Overall Cognitive Status: Within Functional Limits for tasks assessed Area of Impairment: Memory                     Memory: Decreased short-term memory         General Comments: Very pleasant and eager to participate      Exercises General Exercises - Lower Extremity Long Arc Quad: Both;10 reps;Seated;AROM Hip Flexion/Marching: 10 reps;Both;Seated;AROM    General Comments General comments (skin integrity, edema, etc.): tightness in RLE as cords were making leg sore      Pertinent Vitals/Pain Pain Assessment: No/denies pain Faces Pain Scale: No hurt  Home Living                      Prior Function            PT Goals (current goals can now be found in the care plan section) Acute Rehab PT Goals Patient Stated Goal: "Get stronger." Progress towards PT goals: Progressing toward goals    Frequency    Min 3X/week      PT Plan Current plan remains appropriate    Co-evaluation PT/OT/SLP Co-Evaluation/Treatment: Yes Reason for Co-Treatment: Complexity of the patient's impairments (multi-system involvement) PT goals addressed during session: Mobility/safety with mobility OT goals addressed during  session: ADL's and self-care      AM-PAC PT "6 Clicks" Mobility   Outcome Measure  Help needed turning from your back to your side while in a flat bed without using bedrails?: A Little Help needed moving from lying on your back to sitting on the side of a flat bed without using bedrails?: A Little Help needed moving to and from a bed to a chair (including a wheelchair)?: A Little Help needed standing up from a chair using your arms (e.g., wheelchair or bedside chair)?: A Little Help needed to walk in hospital room?: A Lot Help needed climbing 3-5 steps with a railing? : A Lot 6 Click Score: 16    End of Session Equipment Utilized During Treatment: Gait belt Activity Tolerance: Patient tolerated treatment well Patient left: in chair;with call bell/phone within reach;with chair alarm set Nurse Communication: Mobility status PT Visit Diagnosis: Other abnormalities of gait and mobility (R26.89);Difficulty in walking, not elsewhere classified (R26.2)     Time: 2233-6122 PT Time Calculation (min) (ACUTE ONLY): 24 min  Charges:  $Therapeutic Exercise: 8-22 mins                     [place] Pager:  [phone]  Office:  [place] 05/22/2018, 2:16 PM

## 2018-05-22 NOTE — Progress Notes (Signed)
Pt has home CPAP at bedside and states her sister will help place her on when she is ready.

## 2018-05-23 ENCOUNTER — Other Ambulatory Visit: Payer: Self-pay

## 2018-05-23 ENCOUNTER — Encounter (HOSPITAL_COMMUNITY): Payer: Self-pay | Admitting: Cardiovascular Disease

## 2018-05-23 LAB — BASIC METABOLIC PANEL
ANION GAP: 9 (ref 5–15)
BUN: 21 mg/dL (ref 8–23)
CALCIUM: 9.6 mg/dL (ref 8.9–10.3)
CO2: 21 mmol/L — ABNORMAL LOW (ref 22–32)
Chloride: 104 mmol/L (ref 98–111)
Creatinine, Ser: 0.98 mg/dL (ref 0.44–1.00)
GFR calc non Af Amer: >60 mL/min (ref >60)
Glucose, Bld: 180 mg/dL — ABNORMAL HIGH (ref 70–99)
Potassium: 4.2 mmol/L (ref 3.5–5.1)
Sodium: 134 mmol/L — ABNORMAL LOW (ref 135–145)

## 2018-05-23 LAB — GLUCOSE, CAPILLARY
Glucose-Capillary: 116 mg/dL — ABNORMAL HIGH (ref 70–99)
Glucose-Capillary: 142 mg/dL — ABNORMAL HIGH (ref 70–99)

## 2018-05-23 LAB — MAGNESIUM: Magnesium: 1.8 mg/dL (ref 1.7–2.4)

## 2018-05-23 MED ORDER — AMIODARONE HCL 200 MG PO TABS: 200.0000 mg | ORAL_TABLET | Freq: Two times a day (BID) | ORAL | 0 refills | Status: DC

## 2018-05-23 MED ORDER — DILTIAZEM HCL ER COATED BEADS 120 MG PO CP24: 120.0000 mg | ORAL_CAPSULE | Freq: Every day | ORAL | 0 refills | Status: DC

## 2018-05-23 MED ORDER — LORATADINE 10 MG PO TABS: 10.0000 mg | ORAL_TABLET | Freq: Every day | ORAL | Status: DC

## 2018-05-23 MED ORDER — LORATADINE 10 MG PO TABS
10.0000 mg | ORAL_TABLET | Freq: Every day | ORAL | Status: DC
  Administered 2018-05-23: 10 mg via ORAL
  Filled 2018-05-23: qty 1

## 2018-05-23 MED ORDER — METHOCARBAMOL 500 MG PO TABS: 500.0000 mg | ORAL_TABLET | Freq: Four times a day (QID) | ORAL | Status: DC | PRN

## 2018-05-23 NOTE — Discharge Summary (Signed)
Physician Discharge Summary  Judith [name] POE:[id number] DOB: 1950-09-08 DOA: 05/14/2018  PCP: Rutherford Guys, MD  Admit date: 05/14/2018 Discharge date: 05/23/2018  Admitted From: Home Disposition: Home   Recommendations for Outpatient Follow-up:  1. Follow up with PCP in 1-2 weeks 2. Please obtain BMP/CBC in one week 3. Follow up in AFib clinic as below 4. Follow up with Dr. Sharol Given for shoulder pain and f/u right BKA.  Home Health: PT, OT, RN, aid Equipment/Devices: None new Discharge Condition: Stable, improved CODE STATUS: Full Diet recommendation: Heart healthy, carb-modified  Brief/Interim Summary: Judith [name] is a 68 y.o. female with a history of AFib, VT, stage III CKD, recent right BKA, and T2DM who presented to the ED with an unresponsive episode. She is living with her sister since undergoing right BKA recently and has been failing to thrive generally, eating less, getting up less, but on 1/5 noted palpitations typical of previous episodes of AFib with RVR. Her sister drove her to the ED when en route the patient had 2-3 minutes of tensing bilateral arms, being unresponsive with eyes rolled in the back of her head, maintaining postural tone in the seat, then began moving all limbs, bumping the BKA stump against the dashboard which would normally cause excruciating pain though didn't seem to cause pain. On arrival she was found to have a wide complex tachycardia, given amiodarone bolus and metoprolol with conversion to narrow AFib with RVR. Mental status had improved when the patient had another episode lasting about 3 minutes terminated with ativan 2mg  IV, postictal-appearing thereafter. Mental status has waxed and waned consistent with delirium, but has largely returned to baseline. Nausea has resolved and she's eating a normal diet, has had no further seizure-like activity. Heart rhythm converted to NSR overnight into 05/15/2018, though with transition to po amiodarone 1/8, went back  into AFib/aflutter prompting restart of amiodarone infusion. Unfortunately, remained in rapid AFib despite medication titration per EP, so underwent cardioversion 1/13. She remains in sinus rhythm on 1/14 and cleared for discharge per cardiology/EP.  Discharge Diagnoses:  Principal Problem:   V-tach [facility] Active Problems:   DM (diabetes mellitus), type 2 with peripheral vascular complications (HCC)   HTN (hypertension)   Memory loss   Atrial fibrillation with rapid ventricular response (HCC)   Atrial fibrillation with RVR (HCC)   Chronic combined systolic and diastolic congestive heart failure (HCC)   CKD (chronic kidney disease), stage III (HCC)   Seizure (HCC)  Acute metabolic encephalopathy: Resolved. Due to postictal state, possibly related to arrhythmia, also likely an element of acute delirium as she's struggled with that in the hospital in the past. Also has had subacute decline in mood, appetite, etc. following BKA, so suspect depression also contributing on longer time scale.   UTI: Pyuria on UA. Also w/leukocytosis. Unclear whether this is contributing to AMS and/or arrhythmia. Serratia on urine culture, sensitive to CTX which was completed. No leukocytosis, afebrile.   Wide complex tachycardia, possibly ventricular tachycardia, though cardiology favors persistent AFib with RVR, AFlutter: Echocardiogram with EF 60-65%, G1DD, no regional WMA. mild-mod AS.  - Failed to improve despite days of amiodarone infusion, diltiazem. Underwent DCCV x1 05/22/2018 with conversion to NSR currently. Now on po amiodarone and diltiazem - CHA2DS2-VASc score is 5, restarted eliquis.   Recurrent hypomagnesemia: ?long-standing insufficient intake. Gitelman/bartter unlikely without other constellation of derangements/alkalemia. DM controlled, not renal transplant patient, and not on loop/thiazides. - Started daily po supplement 400mg  po BID with improvement. No further  supplementation required  currently.   Troponin elevation: Suspect demand ischemia in setting of arrhythmia, also suspected to be etiology of lactic acid elevation. Lactate has cleared, troponin still with elevation as expected with CAD and significant demand. No intervention planned per cardiology. ECG 1/9 read as STEMI, false positive per cardiology. No chest pain.  Seizure-like activity: No intracerebral abnormalities on imaging, EEG the following day is interpreted as a normal EEG in sleeping and awake states.  - Stopped tramadol.  - Since it is first episode and in setting of dehydration, arrhythmia, and EEG normal will not start AED at this time, though standard post seizure precautions were reviewed.  Nausea: Waxing/waning PTA. Has been evaluated by GI as outpatient. Has now resolved. - Antiemetics prn, tolerating diet, advance as tolerated.  Osteoarthritis of left shoulder, chronic pain, s/p right BKA: Normal wound healing. - Seems to be improved on hydrocodone prn with good tolerance. - Orthopedic follow up as scheduled.   IDT2DM: Last HBA1c 8%. Recently increasing CBGs with improved po intake.  - Restart home medication and follow up closely as outpatient.  Stage III CKD:  - Monitor, avoid nephrotoxins.   HTN:  - Changed norvasc to diltiazem. Low/normal BPs.  OSA:  - Continue CPAP qHS  Hyperthyroidism:  - Continue tapazole. TSH wnl.  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  Discharge Instructions Discharge Instructions    Diet - low sodium heart healthy   Complete by:  As directed    Discharge instructions   Complete by:  As directed    You were admitted for altered mental status, seizure-like activity and cardiac arrhythmia. These have all resolved and you are stable for discharge  with the following recommendations:  - Follow medication recommendations as below, including stopping metoprolol and sotalol and starting diltiazem and amiodarone.  - Because your EEG was normal and this was your first episode of possible seizure, no antiseizure drugs have been prescribed. It is the law not to drive for the next 6 months at a minimum. - Try to minimize using sedating medications including hydrocodone and robaxin. - If your symptoms, return seek medical attention right away, otherwise follow up in the AFib clinic, with Dr. Sharol Given and with your primary doctor.   Increase activity slowly   Complete by:  As directed      Allergies as of 05/23/2018      Reactions   Contrast Media [iodinated Diagnostic Agents] Hives, Other (See Comments)   Spoke to patient, Iodine allergy is really IV contrast allergy.    Novolog [insulin Aspart] Shortness Of Breath, Other (See Comments)   "breathing problems"   Propofol Shortness Of Breath   "Breathing problems - asthma attack" Can take with benadryl   Codeine Nausea And Vomiting, Other (See Comments)   HIGH DOSES-SEVERE VOMITING   Iodine Other (See Comments)   MUST HAVE BENADRYL PRIOR TO PROCEDURE AND RIGHT BEFORE TREATMENT TO COUNTERACT REACTION-BLISTERING REACTION DERMATOLOGICAL   Penicillins Itching, Rash, Other (See Comments)   Has patient had a PCN reaction causing immediate rash, facial/tongue/throat swelling, SOB or lightheadedness with hypotension: no Has patient had a PCN reaction causing severe rash involving mucus membranes or skin necrosis: No Has patient had a PCN reaction that required hospitalization No Has patient had a PCN reaction occurring within the last 10 years: No If all of  the above answers are "NO", then may proceed with Cephalosporin use. CHEST SIZED RASH AND ITCHING   Ace Inhibitors Cough   Demerol [meperidine] Nausea And Vomiting   Dilaudid [hydromorphone Hcl] Other (See Comments)   HEADACHE    Neosporin  [neomycin-bacitracin Zn-polymyx] Itching, Rash, Other (See Comments)   MAKES REACTIONS WORSE WHEN USING AS PROPHYLACTIC   Percocet [oxycodone-acetaminophen] Rash   Tape Itching, Rash      Medication List    STOP taking these medications   amLODipine 2.5 MG tablet Commonly known as:  NORVASC   metoprolol succinate 25 MG 24 hr tablet Commonly known as:  TOPROL-XL   sotalol 120 MG tablet Commonly known as:  BETAPACE     TAKE these medications   acetaminophen 325 MG tablet Commonly known as:  TYLENOL Take 1-2 tablets (325-650 mg total) by mouth every 6 (six) hours as needed for mild pain (pain score 1-3 or temp > 100.5).   albuterol 108 (90 Base) MCG/ACT inhaler Commonly known as:  PROVENTIL HFA Inhale 1-2 puffs into the lungs every 6 (six) hours as needed for wheezing or shortness of breath.   amiodarone 200 MG tablet Commonly known as:  PACERONE Take 1 tablet (200 mg total) by mouth 2 (two) times daily.   apixaban 5 MG Tabs tablet Commonly known as:  ELIQUIS Take 1 tablet (5 mg total) by mouth 2 (two) times daily.   atorvastatin 20 MG tablet Commonly known as:  LIPITOR Take 1 tablet (20 mg total) by mouth daily.   buPROPion 150 MG 12 hr tablet Commonly known as:  WELLBUTRIN SR Take 1 tablet (150 mg total) by mouth 2 (two) times daily.   diltiazem 120 MG 24 hr capsule Commonly known as:  CARDIZEM CD Take 1 capsule (120 mg total) by mouth daily. Start taking on:  May 24, 2018   fenofibrate 160 MG tablet Take 1 tablet (160 mg total) by mouth daily.   FLUoxetine 20 MG capsule Commonly known as:  PROZAC Take 1 capsule (20 mg total) by mouth at bedtime.   furosemide 20 MG tablet Commonly known as:  LASIX Take 3 tablets (60 mg total) by mouth daily.   gabapentin 300 MG capsule Commonly known as:  NEURONTIN Take 3 capsules (900 mg total) by mouth 2 (two) times daily. TAKE 3 CAPSULES BY MOUTH EVERY  MORNING AND 3 CAPSULES AT BEDTIME What changed:  additional  instructions   glucose blood test strip Commonly known as:  TRUE METRIX BLOOD GLUCOSE TEST Used to check blood sugars 2x daily.   HYDROcodone-acetaminophen 5-325 MG tablet Commonly known as:  NORCO/VICODIN Take 1-2 tablets by mouth every 4 (four) hours as needed for moderate pain (pain score 4-6).   insulin degludec 100 UNIT/ML Sopn FlexTouch Pen Commonly known as:  TRESIBA FLEXTOUCH Inject 0.15 mLs (15 Units total) into the skin daily. What changed:    how much to take  when to take this   methimazole 10 MG tablet Commonly known as:  TAPAZOLE Take 1 tablet (10 mg total) by mouth daily.   methocarbamol 500 MG tablet Commonly known as:  ROBAXIN Take 1 tablet (500 mg total) by mouth every 6 (six) hours as needed for muscle spasms.   nitroGLYCERIN 0.4 MG SL tablet Commonly known as:  NITROSTAT Place 1 tablet (0.4 mg total) under the tongue every 5 (five) minutes as needed for chest pain.   pantoprazole 40 MG tablet Commonly known as:  PROTONIX Take 1 tablet (40 mg total) by  mouth 2 (two) times daily.   potassium chloride SA 20 MEQ tablet Commonly known as:  K-DUR,KLOR-CON Take 1 tablet (20 mEq total) by mouth daily.      Follow-up Information    [facility] Follow up.   Specialty:  Home Health Services Why:  Home Health Registered Nurse, Physical and Occupational Therapy and Home Health Aide Contact information: [place] [address] [phone]        [facility] Follow up.   Specialty:  Cardiology Why:  05/30/2018 @ 11:00AM Contact information: [address] [id number] [place] [address] [phone]       Rutherford Guys, MD. Schedule an appointment as soon as possible for a visit in 1 week(s).   Specialty:  Family Medicine Contact information: [address] [place] [address] [phone]        Sueanne Margarita, MD .   Specialty:  Cardiology Contact  information: [phone] [place] [address] [phone]        [name], MD Follow up.   Specialty:  Orthopedic Surgery Contact information: [address] [place] [address] [phone]          Allergies  Allergen Reactions  . Contrast Media [Iodinated Diagnostic Agents] Hives and Other (See Comments)    Spoke to patient, Iodine allergy is really IV contrast allergy.   [name] [Insulin Aspart] Shortness Of Breath and Other (See Comments)    "breathing problems"  . Propofol Shortness Of Breath    "Breathing problems - asthma attack" Can take with benadryl   . Codeine Nausea And Vomiting and Other (See Comments)    HIGH DOSES-SEVERE VOMITING  . Iodine Other (See Comments)    MUST HAVE BENADRYL PRIOR TO PROCEDURE AND RIGHT BEFORE TREATMENT TO COUNTERACT REACTION-BLISTERING REACTION DERMATOLOGICAL  . Penicillins Itching, Rash and Other (See Comments)    Has patient had a PCN reaction causing immediate rash, facial/tongue/throat swelling, SOB or lightheadedness with hypotension: no Has patient had a PCN reaction causing severe rash involving mucus membranes or skin necrosis: No Has patient had a PCN reaction that required hospitalization No Has patient had a PCN reaction occurring within the last 10 years: No If all of the above answers are "NO", then may proceed with Cephalosporin use.  CHEST SIZED RASH AND ITCHING   . Ace Inhibitors Cough  . Demerol [Meperidine] Nausea And Vomiting  . Dilaudid [Hydromorphone Hcl] Other (See Comments)    HEADACHE   . Neosporin [Neomycin-Bacitracin Zn-Polymyx] Itching, Rash and Other (See Comments)    MAKES REACTIONS WORSE WHEN USING AS PROPHYLACTIC  . Percocet [Oxycodone-Acetaminophen] Rash  . Tape Itching and Rash    Consultations:  EP/cardiology  Procedures/Studies: Ct Head Wo Contrast  Result Date: 05/14/2018 CLINICAL DATA:  Altered mental status, new onset of seizures, history of  chronic kidney disease, coronary artery disease, diabetes mellitus, hypertension EXAM: CT HEAD WITHOUT CONTRAST TECHNIQUE: Contiguous axial images were obtained from the base of the skull through the vertex without intravenous contrast. Sagittal and coronal MPR images reconstructed from axial data set. COMPARISON:  05/23/2015 FINDINGS: Brain: Motion artifacts on initial imaging, for which repeat imaging was performed. Generalized atrophy. Normal ventricular morphology. No midline shift or mass effect. Small vessel chronic ischemic changes of deep cerebral white matter. Dystrophic calcification identified at the posterior inferior aspect of the LEFT thalamus, unchanged. No intracranial hemorrhage, mass lesion, or evidence of acute infarction.  No extra-axial fluid collections. Enlarged empty sella. Vascular: Atherosclerotic calcifications of internal carotid arteries at skull base. No hyperdense vessels. Skull: Intact Sinuses/Orbits: Clear Other: N/A IMPRESSION: Atrophy with small vessel chronic ischemic changes of deep cerebral white matter. Chronic dystrophic calcification at the dorsal inferior LEFT thalamus, question sequela of prior hemorrhage. No acute intracranial abnormalities. Enlarged empty sella. Electronically Signed   By: Lavonia Dana M.D.   On: 05/14/2018 17:40   Dg Chest Port 1 View  Result Date: 05/14/2018 CLINICAL DATA:  Onset shortness of breath and nausea this morning. EXAM: PORTABLE CHEST 1 VIEW COMPARISON:  Single-view of the chest 04/16/2018. PA and lateral chest 06/04/2017. FINDINGS: Defibrillator pad is in place. The lungs are clear. Heart size is normal. No pneumothorax or pleural fluid. No acute or focal bony abnormality. IMPRESSION: No acute disease. Electronically Signed   By: Inge Rise M.D.   On: 05/14/2018 12:34      Echocardiogram 05/16/2018: - Left ventricle: The cavity size was normal. Wall thickness was increased in a pattern of severe LVH. Systolic function  was normal. The estimated ejection fraction was in the range of 60% to 65%. Wall motion was normal; there were no regional wall motion abnormalities. There was an increased relative contribution of atrial contraction to ventricular filling. Doppler parameters are consistent with abnormal left ventricular relaxation (grade 1 diastolic dysfunction). Doppler parameters are consistent with high ventricular filling pressure. - Aortic valve: Valve mobility was restricted. There was mild to moderate stenosis. Mean gradient (S): 17 mm Hg. VTI ratio of LVOT to aortic valve: 0.36. Valve area (VTI): 1.37 cm^2. Valve area (Vmax): 1.22 cm^2. Valve area (Vmean): 1.27 cm^2. - Mitral valve: Calcified annulus. Valve area by pressure half-time: 2.39 cm^2. - Left atrium: The atrium was moderately dilated. - Pulmonary arteries: Systolic pressure could not be accurately estimated.  Procedure: Electrical Cardioversion Indications:Atrial Flutter  Procedure Details Consent:Risks of procedure as well as the alternatives and risks of each were explained to the (patient/caregiver). Consent for procedure obtained. Time HCW:CBJSEGBT patient identification, verified procedure, site/side was marked, verified correct patient position, special equipment/implants available, medications/allergies/relevent history reviewed, required imaging and test results available. Performed  Patient placed on cardiac monitor, pulse oximetry, supplemental oxygen as necessary.  Sedation given:propofol Pacer pads placedanterior and posterior chest.  Cardioverted1time(s).  Cardioverted at150J.  Evaluation Findings: Post procedure EKG shows:NSR Complications:None Patientdidtolerate procedure well.   Skeet Latch, MD 05/22/2018,12:35 PM  Subjective: Feels well. No chest pain, dyspnea, palpitations. Moving around at functional baseline. Pain is controlled, No nausea and eating  well.   Discharge Exam: Vitals:   05/22/18 2115 05/23/18 1020  BP: (!) 149/60 (!) 143/82  Pulse: 88   Resp: (!) 24 12  Temp: 98.7 F (37.1 C)   SpO2: 100%    General: Pt is alert, awake, not in acute distress Cardiovascular: RRR, S1/S2 +, no rubs, no gallops Respiratory: CTA bilaterally, no wheezing, no rhonchi Abdominal: Soft, NT, ND, bowel sounds + Extremities: Right BKA  Labs: BNP (last 3 results) No results for input(s): BNP in the last 8760 hours. Basic Metabolic Panel: Recent Labs  Lab 05/19/18 0549 05/20/18 0444 05/21/18 0247 05/22/18 0359 05/23/18 0358  NA 136 135 134* 135 134*  K 5.0 4.8 4.8 4.7 4.2  CL 104 101 104 104 104  CO2 22 24 21* 25 21*  GLUCOSE 129* 132* 148* 110* 180*  BUN 16 17 21 19 21   CREATININE 1.07* 1.09* 1.14* 1.15* 0.98  CALCIUM 9.8 9.9 9.4 9.5 9.6  MG 1.7 1.5* 1.6* 2.3 1.8   Liver Function Tests: No results for input(s): AST, ALT, ALKPHOS, BILITOT, PROT, ALBUMIN in the last 168 hours. No results for input(s): LIPASE, AMYLASE in the last 168 hours. No results for input(s): AMMONIA in the last 168 hours. CBC: Recent Labs  Lab 05/18/18 1637 05/20/18 0444  WBC 9.2 6.9  HGB 11.3* 10.7*  HCT 36.0 33.6*  MCV 86.5 88.7  PLT 364 362   Cardiac Enzymes: No results for input(s): CKTOTAL, CKMB, CKMBINDEX, TROPONINI in the last 168 hours. BNP: Invalid input(s): POCBNP CBG: Recent Labs  Lab 05/22/18 1318 05/22/18 1713 05/22/18 2126 05/23/18 0730 05/23/18 1141  GLUCAP 86 184* 318* 116* 142*   D-Dimer No results for input(s): DDIMER in the last 72 hours. Hgb A1c No results for input(s): HGBA1C in the last 72 hours. Lipid Profile No results for input(s): CHOL, HDL, LDLCALC, TRIG, CHOLHDL, LDLDIRECT in the last 72 hours. Thyroid function studies No results for input(s): TSH, T4TOTAL, T3FREE, THYROIDAB in the last 72 hours.  Invalid input(s): FREET3 Anemia work up No results for input(s): VITAMINB12, FOLATE, FERRITIN, TIBC, IRON,  RETICCTPCT in the last 72 hours. Urinalysis    Component Value Date/Time   COLORURINE YELLOW 05/14/2018 1119   APPEARANCEUR CLOUDY (A) 05/14/2018 1119   LABSPEC 1.017 05/14/2018 1119   PHURINE 5.0 05/14/2018 1119   GLUCOSEU >=500 (A) 05/14/2018 1119   HGBUR SMALL (A) 05/14/2018 1119   BILIRUBINUR NEGATIVE 05/14/2018 1119   BILIRUBINUR negative 07/22/2017 0858   BILIRUBINUR Small 12/06/2014 1441   KETONESUR NEGATIVE 05/14/2018 1119   PROTEINUR 100 (A) 05/14/2018 1119   UROBILINOGEN 0.2 07/22/2017 0858   UROBILINOGEN 1.0 02/12/2015 1238   NITRITE NEGATIVE 05/14/2018 1119   LEUKOCYTESUR LARGE (A) 05/14/2018 1119    Microbiology Recent Results (from the past 240 hour(s))  Culture, Urine     Status: Abnormal   Collection Time: 05/18/18  3:04 AM  Result Value Ref Range Status   Specimen Description URINE, CLEAN CATCH  Final   Special Requests   Final    NONE Performed at [facility], [place] [address], [place], [place] [address]    Culture 20,000 COLONIES/mL SERRATIA MARCESCENS (A)  Final   Report Status 05/20/2018 FINAL  Final   Organism ID, Bacteria SERRATIA MARCESCENS (A)  Final      Susceptibility   Serratia marcescens - MIC*    CEFAZOLIN >=64 RESISTANT Resistant     CEFTRIAXONE <=1 SENSITIVE Sensitive     CIPROFLOXACIN <=0.25 SENSITIVE Sensitive     GENTAMICIN <=1 SENSITIVE Sensitive     NITROFURANTOIN 256 RESISTANT Resistant     TRIMETH/SULFA <=20 SENSITIVE Sensitive     * 20,000 COLONIES/mL SERRATIA MARCESCENS    Time coordinating discharge: Approximately 40 minutes  Patrecia Pour, MD  [facility] 05/23/2018, 1:15 PM Pager [phone]

## 2018-05-23 NOTE — Progress Notes (Signed)
Physical Therapy Treatment Patient Details Name: Judith [name] MRN: [id number] DOB: 1951/01/11 Today's Date: 05/23/2018    History of Present Illness Pt is a 68 y.o. female admitted 05/14/18 with AMS. Found to have wide complex tachycardia, converted to afib with RVR; had seizure in ED, requiring nonrebreather. Worked up for acute metabolic encephalopathy due to postictal state, possibly related to arrhythmia. S/p cardioversion 1/13. 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   PT Comments    Pt progressing with mobility. Performing wheelchair transfers and set-up at supervision-level; only requiring intermittent cues for safety. Increased time spent discussing importance of pressure relief; pt able to demonstrate this for bilateral ischial tuberosities well while seated in w/c. Also discussed therex, fall risk reduction, and some strategies for getting back into w/c after a fall out of it (encouraged further discussion and practice of this with HHPT present). Pt planning to discharge home this afternoon; will have continued support from sister.   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 Pt with good awareness of safety and transfer set-up, only requiring cues to move leg  rest out of way prior to transfer  Ambulation/Gait                 Stairs             Wheelchair Mobility    Modified Rankin (Stroke Patients Only)       Balance Overall balance assessment: Needs assistance Sitting-balance support: Feet unsupported;No upper extremity supported Sitting balance-Leahy Scale: Good Sitting balance - Comments: Able to offload each ischial tuberosity well for pressure relief while seated in w/c                                    Cognition Arousal/Alertness: Awake/alert Behavior During Therapy: WFL for tasks assessed/performed Overall Cognitive Status: Within Functional Limits for tasks assessed                                 General Comments: Some decreased attention noted, likely baseline cognition      Exercises Other Exercises Other Exercises: Pt able to demonstrate R knee flex/ext, hip flex/abd/add well without cues    General Comments General comments (skin integrity, edema, etc.): Increased time spent discussing pressure relief; pt able to demonstrate this for bilateral ischial tuberosities while seated in w/c. Pt asking about how to get back in w/c after falling on floor; discussed set-up for this and PT demonstrated (encouraged pt to practice this with HHPT services as it is important, but also more advanced skill, especially given pt's R shoulder issues). Sister present and supportive  Pertinent Vitals/Pain Pain Assessment: No/denies pain    Home Living                      Prior Function            PT Goals (current goals can now be found in the care plan section) Acute Rehab PT Goals Patient Stated Goal: "Get stronger" PT Goal Formulation: With patient Time For Goal Achievement: 05/31/18 Potential to Achieve Goals: Good Progress towards PT goals: Progressing toward goals    Frequency    Min 3X/week      PT Plan Current plan remains appropriate     Co-evaluation              AM-PAC PT "6 Clicks" Mobility   Outcome Measure  Help needed turning from your back to your side while in a flat bed without using bedrails?: A Little Help needed moving from lying on your back to sitting on the side of a flat bed without using bedrails?: A Little Help needed moving to and from a bed to a chair (including a wheelchair)?: None Help needed standing up from a chair using your arms (e.g., wheelchair or bedside chair)?: A Little Help needed to walk in hospital room?: A Lot Help needed climbing 3-5 steps with a railing? : A Lot 6 Click Score: 17    End of Session   Activity Tolerance: Patient tolerated treatment well Patient left: in chair;with call bell/phone within reach;with family/visitor present Nurse Communication: Mobility status PT Visit Diagnosis: Other abnormalities of gait and mobility (R26.89);Difficulty in walking, not elsewhere classified (R26.2)     Time: 2500-3704 PT Time Calculation (min) (ACUTE ONLY): 20 min  Charges:  $Therapeutic Activity: 8-22 mins                    Mabeline Caras, PT, DPT Acute Rehabilitation Services  Pager [phone] Office [place] 05/23/2018, 2:36 PM

## 2018-05-23 NOTE — Care Management Note (Signed)
Case Management Note  Patient Details  Name: Judith [name] MRN: [id number] Date of Birth: Mar 11, 1951  Subjective/Objective:  Pt presented for unresponsiveness. PTA from home and has support of sister.                    Action/Plan: Patient was previously active with [facility]. Will need HH RN, PT,OT, Aide Orders and F2F. Tiffany Liaison for [facility] is aware that patient will transition home. No further needs from CM at this time.   Expected Discharge Date:                  Expected Discharge Plan:  [place]  In-House Referral:  NA  Discharge planning Services  CM Consult  Post Acute Care Choice:  Home Health, Resumption of Svcs/PTA Provider Choice offered to:  Patient  DME Arranged:  N/A DME Agency:  NA  HH Arranged:  RN, Disease Management, PT, OT, Nurse's Aide [place] Agency:  [facility] (now [facility])  Status of Service:  Completed, signed off  If discussed at [place] of Stay Meetings, dates discussed:    Additional Comments:  Bethena Roys, RN 05/23/2018, 10:53 AM

## 2018-05-23 NOTE — Consult Note (Signed)
   [facility] CM Inpatient Consult   05/23/2018  Judith [name] 05-Aug-1950 [id number]   [facility] Care Management hospital liaison follow up.  Went to bedside to speak with Ms. Brunet. However, she had already been discharged.   Spoke with inpatient RNCM who indicates patient discharged home with [facility] for home health services.   Marthenia Rolling, MSN-Ed, RN,BSN [facility] Liaison [phone]

## 2018-05-24 ENCOUNTER — Telehealth (INDEPENDENT_AMBULATORY_CARE_PROVIDER_SITE_OTHER): Payer: Self-pay

## 2018-05-24 ENCOUNTER — Telehealth: Payer: Self-pay

## 2018-05-24 NOTE — Telephone Encounter (Signed)
noted 

## 2018-05-24 NOTE — Telephone Encounter (Signed)
Dondra Spry with [facility] wanted to let Dr. Sharol Given know that [place] services will restart for patient on 05/26/2018.  Thank you.

## 2018-05-24 NOTE — Telephone Encounter (Signed)
Pt sister called concerned about whether pt should still be taking metformin or not, she stated that pt was released from the hospital recently and that it is no longer on her medication list but it is also not listed under the stop taking list either. She also stated that pt is only taking tresiba.  Please advise

## 2018-05-24 NOTE — Telephone Encounter (Signed)
Please resume the metformin.  I'll see you soon.

## 2018-05-24 NOTE — Telephone Encounter (Signed)
lft vm to return call 

## 2018-05-25 ENCOUNTER — Encounter: Payer: Self-pay | Admitting: Family Medicine

## 2018-05-25 ENCOUNTER — Ambulatory Visit (INDEPENDENT_AMBULATORY_CARE_PROVIDER_SITE_OTHER): Payer: Medicare PPO | Admitting: Family Medicine

## 2018-05-25 VITALS — BP 129/79 | HR 78 | Temp 98.7°F

## 2018-05-25 DIAGNOSIS — I482 Chronic atrial fibrillation, unspecified: Secondary | ICD-10-CM

## 2018-05-25 DIAGNOSIS — D500 Iron deficiency anemia secondary to blood loss (chronic): Secondary | ICD-10-CM | POA: Diagnosis not present

## 2018-05-25 DIAGNOSIS — E1169 Type 2 diabetes mellitus with other specified complication: Secondary | ICD-10-CM | POA: Diagnosis not present

## 2018-05-25 DIAGNOSIS — F418 Other specified anxiety disorders: Secondary | ICD-10-CM

## 2018-05-25 DIAGNOSIS — Z794 Long term (current) use of insulin: Secondary | ICD-10-CM | POA: Diagnosis not present

## 2018-05-25 MED ORDER — MAGNESIUM 200 MG PO TABS: 200.0000 mg | ORAL_TABLET | Freq: Every day | ORAL | 2 refills | Status: DC

## 2018-05-25 MED ORDER — FLUOXETINE HCL 40 MG PO CAPS: 40.0000 mg | ORAL_CAPSULE | Freq: Every day | ORAL | 1 refills | Status: DC

## 2018-05-25 NOTE — Progress Notes (Signed)
1/16/202010:25 AM  Judith [name] 07-25-1950, 68 y.o. female [id number]  Chief Complaint  Patient presents with  . Follow-up    ED follow up for a fib and heart flutter. Has a question about medication that was taken off of her meidication list while in the hospital. Feels she should be taking the meds that were removed.  Has list of the medication in question. Has follow appt tomorrow to get staples removed from leg amputation.    HPI:   Patient is a 68 y.o. female with past medical history significant for  DM2, afib,  CAD, peripheral neuropathy, and right foot amputation, who presents today for hosp followup  Since she was discharged she has been overall doing ok Her sister is caring for her They are following seizure precautions as instructed at discharge Right shoulder, chronic, very limiting to function However she has been really struggled with normal activity, still having some short term memory issues CT scan head was normal She is wondering about depression and would like referral to counseling  Home health services in place Possible prosthesis in about 6 months  Sees Dr Rosita Kea, at [facility] clinic, next week and then will followup with PA Sees Dr Sharol Given this week for staple removal Sees Dr Loanne Drilling on the 29th    Chart review: hosp 12/12-24 for amputation of R transtibial amputation 2/2 DM ulcer by Dr Sharol Given, discharged to inpt rehab and then home On eliquis for aib Found to have anemia with hgb 7.5, transfused, EGD found ulcerative esophagitis, on PPI. eliquis not stopped  However on 05/14/2018 she was admitted again after being found unresponsive, in ER found to have wide complex tachycardia, treated with amiodorone and metoprolol - rhythm changed to afib with RVR, cards decided to cardiovert on 05/22/2018 Discharged on 05/23/2018 She was discharged on PO amiodorone and dilt and eliquis She was also started on PO magnesium for persistent low levels She did have an EEG and  was normal EKG with mild trops, cards states not STEMI  Lab Results  Component Value Date   HGBA1C 8.0 (H) 04/14/2018   HGBA1C 7.9 (A) 04/03/2018   HGBA1C 7.7 (A) 01/30/2018   Lab Results  Component Value Date   MICROALBUR 22.7 (H) 12/06/2014   [place] Comment 07/22/2017   CREATININE 0.98 05/23/2018   Lab Results  Component Value Date   WBC 6.9 05/20/2018   HGB 10.7 (L) 05/20/2018   HCT 33.6 (L) 05/20/2018   MCV 88.7 05/20/2018   PLT 362 05/20/2018    Lab Results  Component Value Date   NA 134 (L) 05/23/2018   K 4.2 05/23/2018   CL 104 05/23/2018   CO2 21 (L) 05/23/2018    Fall Risk  05/25/2018 04/07/2018 02/06/2018 12/31/2017 10/21/2017  Falls in the past year? 0 0 Yes No Yes  Number falls in past yr: - - 2 or more - -  Injury with Fall? - - No - Yes  Comment - - - - -  Risk Factor Category  - - - - -  Risk for fall due to : - - - - -  Risk for fall due to: Comment - - - - -  Follow up - - - - -     Depression screen [facility] 2/9 05/25/2018 04/07/2018 03/31/2018  Decreased Interest 0 0 0  Down, Depressed, Hopeless 0 0 0  PHQ - 2 Score 0 0 0  Some recent data might be hidden    Allergies  Allergen  Reactions  . Contrast Media [Iodinated Diagnostic Agents] Hives and Other (See Comments)    Spoke to patient, Iodine allergy is really IV contrast allergy.   [name] [Insulin Aspart] Shortness Of Breath and Other (See Comments)    "breathing problems"  . Propofol Shortness Of Breath    "Breathing problems - asthma attack" Can take with benadryl   . Codeine Nausea And Vomiting and Other (See Comments)    HIGH DOSES-SEVERE VOMITING  . Iodine Other (See Comments)    MUST HAVE BENADRYL PRIOR TO PROCEDURE AND RIGHT BEFORE TREATMENT TO COUNTERACT REACTION-BLISTERING REACTION DERMATOLOGICAL  . Penicillins Itching, Rash and Other (See Comments)    Has patient had a PCN reaction causing immediate rash, facial/tongue/throat swelling, SOB or lightheadedness with hypotension:  no Has patient had a PCN reaction causing severe rash involving mucus membranes or skin necrosis: No Has patient had a PCN reaction that required hospitalization No Has patient had a PCN reaction occurring within the last 10 years: No If all of the above answers are "NO", then may proceed with Cephalosporin use.  CHEST SIZED RASH AND ITCHING   . Ace Inhibitors Cough  . Demerol [Meperidine] Nausea And Vomiting  . Dilaudid [Hydromorphone Hcl] Other (See Comments)    HEADACHE   . Neosporin [Neomycin-Bacitracin Zn-Polymyx] Itching, Rash and Other (See Comments)    MAKES REACTIONS WORSE WHEN USING AS PROPHYLACTIC  . Percocet [Oxycodone-Acetaminophen] Rash  . Tape Itching and Rash    Prior to Admission medications   Medication Sig Start Date End Date Taking? Authorizing Provider  acetaminophen (TYLENOL) 325 MG tablet Take 1-2 tablets (325-650 mg total) by mouth every 6 (six) hours as needed for mild pain (pain score 1-3 or temp > 100.5). 05/01/18  Yes Angiulli, Lavon Paganini, PA-C  albuterol (PROVENTIL HFA) 108 (90 Base) MCG/ACT inhaler Inhale 1-2 puffs into the lungs every 6 (six) hours as needed for wheezing or shortness of breath. 05/01/18  Yes Angiulli, Lavon Paganini, PA-C  amiodarone (PACERONE) 200 MG tablet Take 1 tablet (200 mg total) by mouth 2 (two) times daily. 05/23/18  Yes Patrecia Pour, MD  apixaban (ELIQUIS) 5 MG TABS tablet Take 1 tablet (5 mg total) by mouth 2 (two) times daily. 05/01/18  Yes Angiulli, Lavon Paganini, PA-C  atorvastatin (LIPITOR) 20 MG tablet Take 1 tablet (20 mg total) by mouth daily. 05/01/18  Yes Angiulli, Lavon Paganini, PA-C  buPROPion [facility] SR) 150 MG 12 hr tablet Take 1 tablet (150 mg total) by mouth 2 (two) times daily. 05/01/18  Yes Angiulli, Lavon Paganini, PA-C  diltiazem (CARDIZEM CD) 120 MG 24 hr capsule Take 1 capsule (120 mg total) by mouth daily. 05/24/18  Yes Patrecia Pour, MD  fenofibrate 160 MG tablet Take 1 tablet (160 mg total) by mouth daily. 05/02/18  Yes  Angiulli, Lavon Paganini, PA-C  FLUoxetine (PROZAC) 20 MG capsule Take 1 capsule (20 mg total) by mouth at bedtime. 05/01/18  Yes Angiulli, Lavon Paganini, PA-C  furosemide (LASIX) 20 MG tablet Take 3 tablets (60 mg total) by mouth daily. 05/01/18 05/01/19 Yes Angiulli, Lavon Paganini, PA-C  gabapentin (NEURONTIN) 300 MG capsule Take 3 capsules (900 mg total) by mouth 2 (two) times daily. TAKE 3 CAPSULES BY MOUTH EVERY  MORNING AND 3 CAPSULES AT BEDTIME Patient taking differently: Take 900 mg by mouth 2 (two) times daily.  05/01/18  Yes Angiulli, Lavon Paganini, PA-C  glucose blood (TRUE METRIX BLOOD GLUCOSE TEST) test strip Used to check blood sugars 2x daily.  08/25/17  Yes Renato Shin, MD  HYDROcodone-acetaminophen (NORCO/VICODIN) 5-325 MG tablet Take 1-2 tablets by mouth every 4 (four) hours as needed for moderate pain (pain score 4-6). 05/01/18  Yes Angiulli, Lavon Paganini, PA-C  insulin degludec (TRESIBA FLEXTOUCH) 100 UNIT/ML SOPN FlexTouch Pen Inject 0.15 mLs (15 Units total) into the skin daily. Patient taking differently: Inject 20 Units into the skin every evening.  04/03/18  Yes Renato Shin, MD  methimazole (TAPAZOLE) 10 MG tablet Take 1 tablet (10 mg total) by mouth daily. 05/01/18  Yes Angiulli, Lavon Paganini, PA-C  methocarbamol (ROBAXIN) 500 MG tablet Take 1 tablet (500 mg total) by mouth every 6 (six) hours as needed for muscle spasms. 05/01/18  Yes Angiulli, Lavon Paganini, PA-C  nitroGLYCERIN (NITROSTAT) 0.4 MG SL tablet Place 1 tablet (0.4 mg total) under the tongue every 5 (five) minutes as needed for chest pain. 02/10/17  Yes [name], PA-C  pantoprazole (PROTONIX) 40 MG tablet Take 1 tablet (40 mg total) by mouth 2 (two) times daily. 05/01/18  Yes Angiulli, Lavon Paganini, PA-C  potassium chloride SA (K-DUR,KLOR-CON) 20 MEQ tablet Take 1 tablet (20 mEq total) by mouth daily. 05/01/18  Yes Angiulli, Lavon Paganini, PA-C    Past Medical History:  Diagnosis Date  . Abnormal EKG 07/31/2013  . Arthritis    "hands" (03/06/2015)   . Asthma   . Carpal tunnel syndrome, bilateral   . Chronic kidney disease   . Complication of anesthesia    slow to wake up. Pt sts she woke up during surgery many years ago.  . Coronary artery disease     2 v CAD with CTO of the RCA and high grade bifurcational LCx/OM stenosis. S/P PCI DES x 2 to the LCx/OM.  Marland Kitchen Diabetic peripheral neuropathy ([place]) "since 1996"  . GERD (gastroesophageal reflux disease)   . Goiter   . Headache    migraines prior to menopause  . History of shingles 06/01/2013  . Hyperlipidemia LDL goal <70 10/13/2015  . Hypertension   . Hyperthyroidism   . Osteomyelitis of foot (HCC)    Right  . PAF (paroxysmal atrial fibrillation) ([place]) 04/29/2015   CHADS2VASC score of 5 now on Apixaban  . Pneumonia ~ 1976  . Sleep apnea    Bipap  . Tremors of nervous system   . Type II diabetes mellitus (HCC)    insulin dependent    Past Surgical History:  Procedure Laterality Date  . ABDOMINAL HYSTERECTOMY  1988   age 4; CERVICAL DYSPLASIA; ovaries intact.   . AMPUTATION Right 01/23/2016   Procedure: Right 3rd Ray Amputation;  Surgeon: [name], MD;  Location: [place];  Service: Orthopedics;  Laterality: Right;  . AMPUTATION Right 02/13/2016   Procedure: Right Transmetatarsal Amputation;  Surgeon: [name], MD;  Location: [place];  Service: Orthopedics;  Laterality: Right;  . AMPUTATION Right 04/15/2018   Procedure: AMPUTATION BELOW KNEE;  Surgeon: [name], MD;  Location: [place];  Service: Orthopedics;  Laterality: Right;  . BIOPSY  04/16/2018   Procedure: BIOPSY;  Surgeon: Irene Shipper, MD;  Location: [facility] ENDOSCOPY;  Service: Endoscopy;;  . CARDIAC CATHETERIZATION N/A 02/27/2015   Procedure: Left Heart Cath and Coronary Angiography;  Surgeon: [name], MD; LAD 40%, mCFX 80%, OM 70%, RCA 100% calcified       . CARDIAC CATHETERIZATION N/A 03/06/2015   Procedure: Coronary Stent Intervention;  Surgeon: [name], MD;  Location: [place] CV LAB;   Service: Cardiovascular;  Laterality:  N/A;  Mid CX 3.50x12 promus DES w/ 0% resdual and Prox OM1 2.50x20 promus DES w/ 20% residual  . CARDIOVERSION    . CARDIOVERSION N/A 05/22/2018   Procedure: CARDIOVERSION;  Surgeon: Skeet Latch, MD;  Location: [place];  Service: Cardiovascular;  Laterality: N/A;  . CARPAL TUNNEL RELEASE Right Nov 2015  . CARPAL TUNNEL RELEASE Right 1992; 05/2014   [name]; [place]  . CESAREAN SECTION  1982; 1984  . ESOPHAGOGASTRODUODENOSCOPY (EGD) WITH PROPOFOL N/A 04/16/2018   Procedure: ESOPHAGOGASTRODUODENOSCOPY (EGD) WITH PROPOFOL;  Surgeon: Irene Shipper, MD;  [facility] ENDOSCOPY;  Service: Endoscopy;  Laterality: N/A;  . FOOT NEUROMA SURGERY Bilateral 2000  . I&D EXTREMITY Left 01/06/2018   Procedure: DEBRIDEMENT ULCER LEFT FOOT;  Surgeon: [name], MD;  Location: [place];  Service: Orthopedics;  Laterality: Left;  . KNEE ARTHROSCOPY Right ~ 2003   "meniscus repair"  . SHOULDER OPEN ROTATOR CUFF REPAIR Right 1996; 1998   "w/fracture repair"  . THYROID SURGERY  2000   "removed lots of nodules"  . TONSILLECTOMY  1976    Social History   Tobacco Use  . Smoking status: Former Smoker    Packs/day: 0.00    Years: 41.00    Pack years: 0.00    Types: Cigarettes    Last attempt to quit: 03/05/2015    Years since quitting: 3.2  . Smokeless tobacco: Never Used  . Tobacco comment: 04/29/2015 "quit smoking cigarettes 02/27/2015"  Substance Use Topics  . Alcohol use: No    Family History  Problem Relation Age of Onset  . Cancer Mother 68       bronchial cancer  . Breast cancer Mother   . Lung cancer Mother   . Hypertension Father   . COPD Father   . Heart disease Father 70       CAD with cardiac stenting  . Heart attack Father   . Parkinson's disease Father   . Allergies Sister   . Breast cancer Maternal Grandmother   . Emphysema Maternal Grandfather   . Leukemia Paternal Grandmother   . Emphysema Paternal Grandfather   . Thyroid  disease Neg Hx     Review of Systems  Constitutional: Negative for chills and fever.  Respiratory: Negative for cough and shortness of breath.   Cardiovascular: Negative for chest pain, palpitations and leg swelling.  Gastrointestinal: Negative for abdominal pain, blood in stool, melena, nausea and vomiting.   Per hpi   OBJECTIVE:  Blood pressure 129/79, pulse 78, temperature 98.7 F (37.1 C), SpO2 100 %. There is no height or weight on file to calculate BMI.   Physical Exam Vitals signs and nursing note reviewed.  Constitutional:      Appearance: She is well-developed.  HENT:     Head: Normocephalic and atraumatic.     Mouth/Throat:     Pharynx: No oropharyngeal exudate.  Eyes:     General: No scleral icterus.    Conjunctiva/sclera: Conjunctivae normal.     Pupils: Pupils are equal, round, and reactive to light.  Neck:     Musculoskeletal: Neck supple.  Cardiovascular:     Rate and Rhythm: Normal rate and regular rhythm.     Heart sounds: Murmur present. No friction rub. No gallop.   Pulmonary:     Effort: Pulmonary effort is normal.     Breath sounds: Normal breath sounds. No wheezing or rales.  Musculoskeletal:     Right lower leg: No edema.     Left  lower leg: No edema.     Comments: RLE stump staples intact, skin well approximated, no redness, warmth or drainage  Skin:    General: Skin is warm and dry.  Neurological:     Mental Status: She is alert and oriented to person, place, and time.     ASSESSMENT and PLAN  1. Anemia due to chronic blood loss Presumed to be from ulcerative esophagitis, s/p transfusion, to continue high dose PPI. RTC precautions given.  - CBC  2. Hypomagnesemia On mag supplements, thought to be secondary to PPI use, however given #1, needs to remain on appropriate treatment. Will continue to monitor closely.  - Magnesium - Magnesium; Standing  3. Chronic atrial fibrillation S/p cardioversion, on amiodorone, coreg and eliquis.  Managed by cards  4. Type 2 diabetes mellitus with other specified complication, with long-term current use of insulin (HCC) Controlled. Continue current regime.  - Comprehensive metabolic panel  5. Depression with anxiety Uncontrolled. Triggered by recent medical issues. Increasing prozac and referring for counseling - Ambulatory referral to Psychology  Other orders - FLUoxetine (PROZAC) 40 MG capsule; Take 1 capsule (40 mg total) by mouth at bedtime. - Magnesium 200 MG TABS; Take 1 tablet (200 mg total) by mouth daily.  Return in about 4 weeks (around 06/22/2018).    Rutherford Guys, MD Primary Care at [place], [place] [address] Ph.  [phone] Fax [phone]

## 2018-05-25 NOTE — Telephone Encounter (Signed)
Second attempt to reach pt remains unsuccessful. LVM requesting returned call.

## 2018-05-25 NOTE — Telephone Encounter (Signed)
Spoke to pt and informed her to continue metformin per Dr. Loanne Drilling

## 2018-05-25 NOTE — Patient Instructions (Signed)
° ° ° °  If you have lab work done today you will be contacted with your lab results within the next 2 weeks.  If you have not heard from us then please contact us. The fastest way to get your results is to register for My Chart. ° ° °IF you received an x-ray today, you will receive an invoice from [facility]. Please contact [facility] at [phone] with questions or concerns regarding your invoice.  ° °IF you received labwork today, you will receive an invoice from [facility]. Please contact [facility] at [phone] with questions or concerns regarding your invoice.  ° °Our billing staff will not be able to assist you with questions regarding bills from these companies. ° °You will be contacted with the lab results as soon as they are available. The fastest way to get your results is to activate your My Chart account. Instructions are located on the last page of this paperwork. If you have not heard from us regarding the results in 2 weeks, please contact this office. °  ° ° ° °

## 2018-05-26 ENCOUNTER — Ambulatory Visit (INDEPENDENT_AMBULATORY_CARE_PROVIDER_SITE_OTHER): Payer: Medicare PPO | Admitting: Physician Assistant

## 2018-05-26 ENCOUNTER — Encounter (INDEPENDENT_AMBULATORY_CARE_PROVIDER_SITE_OTHER): Payer: Self-pay | Admitting: Physician Assistant

## 2018-05-26 VITALS — Ht 64.0 in | Wt 156.5 lb

## 2018-05-26 DIAGNOSIS — N183 Chronic kidney disease, stage 3 unspecified: Secondary | ICD-10-CM

## 2018-05-26 DIAGNOSIS — Z794 Long term (current) use of insulin: Secondary | ICD-10-CM

## 2018-05-26 DIAGNOSIS — E1122 Type 2 diabetes mellitus with diabetic chronic kidney disease: Secondary | ICD-10-CM | POA: Diagnosis not present

## 2018-05-26 DIAGNOSIS — Z4781 Encounter for orthopedic aftercare following surgical amputation: Secondary | ICD-10-CM | POA: Diagnosis not present

## 2018-05-26 DIAGNOSIS — Z89511 Acquired absence of right leg below knee: Secondary | ICD-10-CM

## 2018-05-26 DIAGNOSIS — M19042 Primary osteoarthritis, left hand: Secondary | ICD-10-CM | POA: Diagnosis not present

## 2018-05-26 DIAGNOSIS — I4819 Other persistent atrial fibrillation: Secondary | ICD-10-CM | POA: Diagnosis not present

## 2018-05-26 DIAGNOSIS — I5030 Unspecified diastolic (congestive) heart failure: Secondary | ICD-10-CM | POA: Diagnosis not present

## 2018-05-26 DIAGNOSIS — E1142 Type 2 diabetes mellitus with diabetic polyneuropathy: Secondary | ICD-10-CM

## 2018-05-26 DIAGNOSIS — I130 Hypertensive heart and chronic kidney disease with heart failure and stage 1 through stage 4 chronic kidney disease, or unspecified chronic kidney disease: Secondary | ICD-10-CM | POA: Diagnosis not present

## 2018-05-26 DIAGNOSIS — M19041 Primary osteoarthritis, right hand: Secondary | ICD-10-CM | POA: Diagnosis not present

## 2018-05-26 DIAGNOSIS — M7541 Impingement syndrome of right shoulder: Secondary | ICD-10-CM

## 2018-05-26 DIAGNOSIS — E1151 Type 2 diabetes mellitus with diabetic peripheral angiopathy without gangrene: Secondary | ICD-10-CM | POA: Diagnosis not present

## 2018-05-26 LAB — COMPREHENSIVE METABOLIC PANEL
ALT: 23 IU/L (ref 0–32)
AST: 21 IU/L (ref 0–40)
Albumin/Globulin Ratio: 1.4 (ref 1.2–2.2)
Albumin: 4.2 g/dL (ref 3.6–4.8)
Alkaline Phosphatase: 114 IU/L (ref 39–117)
BUN/Creatinine Ratio: 18 (ref 12–28)
BUN: 22 mg/dL (ref 8–27)
Bilirubin Total: 0.3 mg/dL (ref 0.0–1.2)
CO2: 22 mmol/L (ref 20–29)
Calcium: 10.4 mg/dL — ABNORMAL HIGH (ref 8.7–10.3)
Chloride: 99 mmol/L (ref 96–106)
Creatinine, Ser: 1.23 mg/dL — ABNORMAL HIGH (ref 0.57–1.00)
GFR calc Af Amer: 53 mL/min/{1.73_m2} — ABNORMAL LOW (ref >59)
GFR calc non Af Amer: 46 mL/min/{1.73_m2} — ABNORMAL LOW (ref >59)
Globulin, Total: 3.1 g/dL (ref 1.5–4.5)
Glucose: 153 mg/dL — ABNORMAL HIGH (ref 65–99)
Potassium: 5.6 mmol/L — ABNORMAL HIGH (ref 3.5–5.2)
Sodium: 137 mmol/L (ref 134–144)
Total Protein: 7.3 g/dL (ref 6.0–8.5)

## 2018-05-26 LAB — CBC
Hematocrit: 33.5 % — ABNORMAL LOW (ref 34.0–46.6)
Hemoglobin: 11 g/dL — ABNORMAL LOW (ref 11.1–15.9)
MCH: 27.8 pg (ref 26.6–33.0)
MCHC: 32.8 g/dL (ref 31.5–35.7)
MCV: 85 fL (ref 79–97)
Platelets: 428 10*3/uL (ref 150–450)
RBC: 3.95 x10E6/uL (ref 3.77–5.28)
RDW: 13.8 % (ref 11.7–15.4)
WBC: 7.1 10*3/uL (ref 3.4–10.8)

## 2018-05-26 LAB — MAGNESIUM: Magnesium: 1.8 mg/dL (ref 1.6–2.3)

## 2018-05-26 NOTE — Progress Notes (Signed)
Office Visit Note   Patient: Judith [name]           Date of Birth: 02/14/1951           MRN: [id number] Visit Date: 05/26/2018              Requested by: Rutherford Guys, MD [address] [place], [place] [address] PCP: Rutherford Guys, MD  Chief Complaint  Patient presents with  . Right Leg - Routine Post Op    BKA      HPI: The patient is a 68 yo woman who is here with her partner for post operative follow up following right transtibial amputation on 04/15/2018.  She went to inpatient rehabilitation postoperatively and did well with this.  She was preparing to have her right shoulder decompressive surgery but but developed cardiac complications and required readmission to the hospital.  She is now doing much better from a cardiac standpoint and would like some home exercise program to work with for her shoulder.  She is getting home therapies.  She has been working with biotech clinic for her stump shrinkers for her right transtibial amputation.  She does report some numbness over the right lateral thigh and this radiates below the knee laterally and we discussed that this is likely coming from her back but there would be nothing intervention wise that we would change at this point.  Assessment & Plan: Visit Diagnoses:  1. Acquired absence of right leg below knee ([place])   2. Impingement syndrome of right shoulder   3. Type 2 diabetes mellitus with diabetic polyneuropathy, with long-term current use of insulin ([place])   4. CKD (chronic kidney disease), stage III ([place])     Plan: Sutures were harvested this visit.  She can wash the right transtibial amputation with Dial soap and water in the shower daily and reapply her silver stump shrinker stockings.  She was given a prescription for biotech to fabricate a K3 prosthesis.  She does have the potential to become a community ambulator and walk at varying speeds in the community on a regular basis for activities of daily living including  attending community services and events.  She will need to be able to change speeds while walking and also be required to walk on uneven surfaces such as grass, gravel, curbs, ramps, stairs and bleachers.  She will follow-up here in 3 weeks.  She was given a home exercise program for rotator cuff strengthening and can work with the physical therapist at home on this as well.  Follow-Up Instructions: Return in about 3 weeks (around 06/16/2018).   Ortho Exam  Patient is alert, oriented, no adenopathy, well-dressed, normal affect, normal respiratory effort. Right transtibial amputation sutures were harvested this visit.  There is no signs of infection or cellulitis.  She does have subjective numbness over the thigh and lateral residual limb over the calf.  She is got good knee extension and flexion. She continues to have significant limitations with the shoulder with decreased forward flexion internal and external rotation and pain with movement.    Imaging: No results found. No images are attached to the encounter.  Labs: Lab Results  Component Value Date   HGBA1C 8.0 (H) 04/14/2018   HGBA1C 7.9 (A) 04/03/2018   HGBA1C 7.7 (A) 01/30/2018   ESRSEDRATE 133 (H) 01/02/2018   CRP 23.5 (H) 01/02/2018   REPTSTATUS 05/20/2018 FINAL 05/18/2018   GRAMSTAIN  01/06/2018    MODERATE WBC PRESENT, PREDOMINANTLY PMN RARE  GRAM POSITIVE COCCI    CULT 20,000 COLONIES/mL SERRATIA MARCESCENS (A) 05/18/2018   LABORGA SERRATIA MARCESCENS (A) 05/18/2018     Lab Results  Component Value Date   ALBUMIN 4.2 05/25/2018   ALBUMIN 3.6 05/14/2018   ALBUMIN 2.5 (L) 04/21/2018    Body mass index is 26.87 kg/m.  Orders:  No orders of the defined types were placed in this encounter.  No orders of the defined types were placed in this encounter.    Procedures: No procedures performed  Clinical Data: No additional findings.  ROS:  All other systems negative, except as noted in the HPI. Review of  Systems  Objective: Vital Signs: Ht 5\' 4"  (1.626 m)   Wt 156 lb 8.5 oz (71 kg)   BMI 26.87 kg/m   Specialty Comments:  No specialty comments available.  PMFS History: Patient Active Problem List   Diagnosis Date Noted  . V-tach ([place]) 05/14/2018  . Seizure ([place]) 05/14/2018  . Slow transit constipation   . Fall   . Labile blood glucose   . Labile blood pressure   . Chronic combined systolic and diastolic congestive heart failure ([place])   . Chronic diastolic congestive heart failure ([place])   . CKD (chronic kidney disease), stage III ([place])   . Type 2 diabetes mellitus with peripheral neuropathy (HCC)   . Amputation of right lower extremity below knee ([place]) 04/20/2018  . Unilateral traumatic amputation of right leg below knee with complication, initial encounter ([place])   . Post-operative pain   . PAF (paroxysmal atrial fibrillation) ([place])   . Duodenal ulcer   . Atrial fibrillation with RVR ([place]) 04/14/2018  . Foot osteomyelitis, right ([place]) 04/14/2018  . Cutaneous abscess of left foot   . Acute respiratory failure with hypoxia ([place])   . Cellulitis 01/02/2018  . LGI bleed   . Acute blood loss anemia   . Wide-complex tachycardia ([place]) 07/04/2017  . Type II diabetes mellitus, uncontrolled ([place]) 07/04/2017  . Peripheral neuropathy 07/04/2017  . H/O hyperthyroidism 07/04/2017  . Atrial fibrillation with rapid ventricular response ([place]) 08/06/2016  . S/P transmetatarsal amputation of foot, right ([place]) 04/19/2016  . Obstructive sleep apnea 11/26/2015  . Bilateral carpal tunnel syndrome 11/26/2015  . Hypomagnesemia 11/16/2015  . Hyperlipidemia LDL goal <70 10/13/2015  . Abnormality of gait 09/02/2015  . Memory loss 08/12/2015  . Diabetic peripheral neuropathy ([place]) 08/12/2015  . Vitamin D deficiency 08/12/2015  . Hyperthyroidism 04/30/2015  . Heme positive stool   . Persistent atrial fibrillation 04/29/2015  . Coronary artery disease with stable angina pectoris ([place]) 03/29/2015    . Abnormal nuclear stress test   . History of goiter 09/28/2014  . GERD (gastroesophageal reflux disease) 07/31/2013  . Depression with anxiety 06/01/2013  . DM (diabetes mellitus), type 2 with peripheral vascular complications ([place]) 63/05/6008  . HTN (hypertension) 01/30/2013   Past Medical History:  Diagnosis Date  . Abnormal EKG 07/31/2013  . Arthritis    "hands" (03/06/2015)  . Asthma   . Carpal tunnel syndrome, bilateral   . Chronic kidney disease   . Complication of anesthesia    slow to wake up. Pt sts she woke up during surgery many years ago.  . Coronary artery disease     2 v CAD with CTO of the RCA and high grade bifurcational LCx/OM stenosis. S/P PCI DES x 2 to the LCx/OM.  Marland Kitchen Diabetic peripheral neuropathy ([place]) "since 1996"  . GERD (gastroesophageal reflux disease)   . Goiter   .  Headache    migraines prior to menopause  . History of shingles 06/01/2013  . Hyperlipidemia LDL goal <70 10/13/2015  . Hypertension   . Hyperthyroidism   . Osteomyelitis of foot (HCC)    Right  . PAF (paroxysmal atrial fibrillation) ([place]) 04/29/2015   CHADS2VASC score of 5 now on Apixaban  . Pneumonia ~ 1976  . Sleep apnea    Bipap  . Tremors of nervous system   . Type II diabetes mellitus (HCC)    insulin dependent    Family History  Problem Relation Age of Onset  . Cancer Mother 66       bronchial cancer  . Breast cancer Mother   . Lung cancer Mother   . Hypertension Father   . COPD Father   . Heart disease Father 32       CAD with cardiac stenting  . Heart attack Father   . Parkinson's disease Father   . Allergies Sister   . Breast cancer Maternal Grandmother   . Emphysema Maternal Grandfather   . Leukemia Paternal Grandmother   . Emphysema Paternal Grandfather   . Thyroid disease Neg Hx     Past Surgical History:  Procedure Laterality Date  . ABDOMINAL HYSTERECTOMY  1988   age 79; CERVICAL DYSPLASIA; ovaries intact.   . AMPUTATION Right 01/23/2016   Procedure:  Right 3rd Ray Amputation;  Surgeon: [name], MD;  Location: [facility];  Service: Orthopedics;  Laterality: Right;  . AMPUTATION Right 02/13/2016   Procedure: Right Transmetatarsal Amputation;  Surgeon: [name], MD;  Location: [facility];  Service: Orthopedics;  Laterality: Right;  . AMPUTATION Right 04/15/2018   Procedure: AMPUTATION BELOW KNEE;  Surgeon: [name], MD;  Location: [facility];  Service: Orthopedics;  Laterality: Right;  . BIOPSY  04/16/2018   Procedure: BIOPSY;  Surgeon: Irene Shipper, MD;  Location: [facility] ENDOSCOPY;  Service: Endoscopy;;  . CARDIAC CATHETERIZATION N/A 02/27/2015   Procedure: Left Heart Cath and Coronary Angiography;  Surgeon: [name], MD; LAD 40%, mCFX 80%, OM 70%, RCA 100% calcified       . CARDIAC CATHETERIZATION N/A 03/06/2015   Procedure: Coronary Stent Intervention;  Surgeon: [name], MD;  Location: [facility];  Service: Cardiovascular;  Laterality: N/A;  Mid CX 3.50x12 promus DES w/ 0% resdual and Prox OM1 2.50x20 promus DES w/ 20% residual  . CARDIOVERSION    . CARDIOVERSION N/A 05/22/2018   Procedure: CARDIOVERSION;  Surgeon: Skeet Latch, MD;  Location: [facility];  Service: Cardiovascular;  Laterality: N/A;  . CARPAL TUNNEL RELEASE Right Nov 2015  . CARPAL TUNNEL RELEASE Right 1992; 05/2014   [name]; [place]  . CESAREAN SECTION  1982; 1984  . ESOPHAGOGASTRODUODENOSCOPY (EGD) WITH PROPOFOL N/A 04/16/2018   Procedure: ESOPHAGOGASTRODUODENOSCOPY (EGD) WITH PROPOFOL;  Surgeon: Irene Shipper, MD;  Location: [facility] ENDOSCOPY;  Service: Endoscopy;  Laterality: N/A;  . FOOT NEUROMA SURGERY Bilateral 2000  . I&D EXTREMITY Left 01/06/2018   Procedure: DEBRIDEMENT ULCER LEFT FOOT;  Surgeon: [name], MD;  Location: [facility];  Service: Orthopedics;  Laterality: Left;  . KNEE ARTHROSCOPY Right ~ 2003   "meniscus repair"  . SHOULDER OPEN ROTATOR CUFF REPAIR Right 1996; 1998   "w/fracture repair"  . THYROID SURGERY  2000   "removed  lots of nodules"  . TONSILLECTOMY  1976   Social History   Occupational History  . Occupation: Landscape architect  Tobacco Use  . Smoking status: Former Smoker  Packs/day: 0.00    Years: 41.00    Pack years: 0.00    Types: Cigarettes    Last attempt to quit: 03/05/2015    Years since quitting: 3.2  . Smokeless tobacco: Never Used  . Tobacco comment: 04/29/2015 "quit smoking cigarettes 02/27/2015"  Substance and Sexual Activity  . Alcohol use: No  . Drug use: No  . Sexual activity: Not Currently    Birth control/protection: Post-menopausal, Surgical

## 2018-05-29 DIAGNOSIS — M19042 Primary osteoarthritis, left hand: Secondary | ICD-10-CM | POA: Diagnosis not present

## 2018-05-29 DIAGNOSIS — I130 Hypertensive heart and chronic kidney disease with heart failure and stage 1 through stage 4 chronic kidney disease, or unspecified chronic kidney disease: Secondary | ICD-10-CM | POA: Diagnosis not present

## 2018-05-29 DIAGNOSIS — Z4781 Encounter for orthopedic aftercare following surgical amputation: Secondary | ICD-10-CM | POA: Diagnosis not present

## 2018-05-29 DIAGNOSIS — M19041 Primary osteoarthritis, right hand: Secondary | ICD-10-CM | POA: Diagnosis not present

## 2018-05-29 DIAGNOSIS — I5030 Unspecified diastolic (congestive) heart failure: Secondary | ICD-10-CM | POA: Diagnosis not present

## 2018-05-29 DIAGNOSIS — I4819 Other persistent atrial fibrillation: Secondary | ICD-10-CM | POA: Diagnosis not present

## 2018-05-29 DIAGNOSIS — N183 Chronic kidney disease, stage 3 (moderate): Secondary | ICD-10-CM | POA: Diagnosis not present

## 2018-05-29 DIAGNOSIS — E1151 Type 2 diabetes mellitus with diabetic peripheral angiopathy without gangrene: Secondary | ICD-10-CM | POA: Diagnosis not present

## 2018-05-29 DIAGNOSIS — E1122 Type 2 diabetes mellitus with diabetic chronic kidney disease: Secondary | ICD-10-CM | POA: Diagnosis not present

## 2018-05-30 ENCOUNTER — Telehealth (INDEPENDENT_AMBULATORY_CARE_PROVIDER_SITE_OTHER): Payer: Self-pay | Admitting: Orthopedic Surgery

## 2018-05-30 ENCOUNTER — Encounter (HOSPITAL_COMMUNITY): Payer: Self-pay | Admitting: Nurse Practitioner

## 2018-05-30 ENCOUNTER — Ambulatory Visit (HOSPITAL_COMMUNITY)
Admission: RE | Admit: 2018-05-30 | Discharge: 2018-05-30 | Disposition: A | Discharge status: Home / Self Care | Payer: Medicare PPO | Source: Ambulatory Visit | Attending: Nurse Practitioner | Admitting: Nurse Practitioner

## 2018-05-30 VITALS — BP 94/62 | HR 130 | Ht 64.0 in | Wt 153.0 lb

## 2018-05-30 DIAGNOSIS — Z79899 Other long term (current) drug therapy: Secondary | ICD-10-CM | POA: Insufficient documentation

## 2018-05-30 DIAGNOSIS — K219 Gastro-esophageal reflux disease without esophagitis: Secondary | ICD-10-CM | POA: Insufficient documentation

## 2018-05-30 DIAGNOSIS — Z881 Allergy status to other antibiotic agents status: Secondary | ICD-10-CM | POA: Diagnosis not present

## 2018-05-30 DIAGNOSIS — G4733 Obstructive sleep apnea (adult) (pediatric): Secondary | ICD-10-CM | POA: Insufficient documentation

## 2018-05-30 DIAGNOSIS — Z7901 Long term (current) use of anticoagulants: Secondary | ICD-10-CM | POA: Insufficient documentation

## 2018-05-30 DIAGNOSIS — Z885 Allergy status to narcotic agent status: Secondary | ICD-10-CM | POA: Diagnosis not present

## 2018-05-30 DIAGNOSIS — I5031 Acute diastolic (congestive) heart failure: Secondary | ICD-10-CM | POA: Insufficient documentation

## 2018-05-30 DIAGNOSIS — I4892 Unspecified atrial flutter: Secondary | ICD-10-CM | POA: Diagnosis not present

## 2018-05-30 DIAGNOSIS — E785 Hyperlipidemia, unspecified: Secondary | ICD-10-CM | POA: Diagnosis not present

## 2018-05-30 DIAGNOSIS — I2510 Atherosclerotic heart disease of native coronary artery without angina pectoris: Secondary | ICD-10-CM | POA: Diagnosis not present

## 2018-05-30 DIAGNOSIS — I4819 Other persistent atrial fibrillation: Secondary | ICD-10-CM | POA: Diagnosis not present

## 2018-05-30 DIAGNOSIS — E1142 Type 2 diabetes mellitus with diabetic polyneuropathy: Secondary | ICD-10-CM | POA: Insufficient documentation

## 2018-05-30 DIAGNOSIS — N189 Chronic kidney disease, unspecified: Secondary | ICD-10-CM | POA: Insufficient documentation

## 2018-05-30 DIAGNOSIS — Z87891 Personal history of nicotine dependence: Secondary | ICD-10-CM | POA: Diagnosis not present

## 2018-05-30 DIAGNOSIS — R9431 Abnormal electrocardiogram [ECG] [EKG]: Secondary | ICD-10-CM | POA: Diagnosis not present

## 2018-05-30 DIAGNOSIS — Z8249 Family history of ischemic heart disease and other diseases of the circulatory system: Secondary | ICD-10-CM | POA: Insufficient documentation

## 2018-05-30 DIAGNOSIS — Z794 Long term (current) use of insulin: Secondary | ICD-10-CM | POA: Insufficient documentation

## 2018-05-30 DIAGNOSIS — E1122 Type 2 diabetes mellitus with diabetic chronic kidney disease: Secondary | ICD-10-CM | POA: Insufficient documentation

## 2018-05-30 DIAGNOSIS — Z91041 Radiographic dye allergy status: Secondary | ICD-10-CM | POA: Diagnosis not present

## 2018-05-30 DIAGNOSIS — Z89511 Acquired absence of right leg below knee: Secondary | ICD-10-CM | POA: Insufficient documentation

## 2018-05-30 DIAGNOSIS — I130 Hypertensive heart and chronic kidney disease with heart failure and stage 1 through stage 4 chronic kidney disease, or unspecified chronic kidney disease: Secondary | ICD-10-CM | POA: Insufficient documentation

## 2018-05-30 DIAGNOSIS — Z880 Allergy status to penicillin: Secondary | ICD-10-CM | POA: Insufficient documentation

## 2018-05-30 DIAGNOSIS — Z888 Allergy status to other drugs, medicaments and biological substances status: Secondary | ICD-10-CM | POA: Insufficient documentation

## 2018-05-30 NOTE — Telephone Encounter (Signed)
Gracee-(PPT) from [facility] called needing verbal orders for HHPT 2 Wk 6. The number to contact Annabelle Harman is [phone]

## 2018-05-30 NOTE — Progress Notes (Signed)
Primary Care Physician: Rutherford Guys, MD Referring Physician:[facility] f/u EP: Dr. Rayann Heman Cardiologist: Dr. Laurell Josephs is a 68 y.o. 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  She was admitted 04/14/18 with osteomyelitis of the Rt foot. Admission EKG showed atrial flutter with 2-1 conduction. She was seen by the Hospitalist service and placed on Diltiazem with improvement in her rate but she remained in atrial flutter (Cardiology was not consulted). On 04/15/18 she had Rt transtibial amputation. Post op course complicated by GI bleeding and anemia requiring transfusion. Endoscopy 04/16/18 showed esophagitis. She also had acute diastolic CHF and had to be diuresed. Eliquis was resumed, according to the records she remained in AF and was transferred to rehab. She was discharged 05/02/18.  She came to the ER  via private vehicle, reported to family not feeling well, nauseated, and needed to go to the ER, HPI was limited with some ongoing lethargy.  Reportedly en-route she had what appeared to her sister a seizure, upon arrival to the ER she was noted on telemetry to have AFib w/RVR, episodes of NSVT vs abbarency, cardiology consulted.  She had received amiodarone IV and continued  on po amiodarone.and metoprolol in the ER that blunted v rates, her sotalol stopped, though no appreciation of QT prolongation.  Dr. Harrell Gave worried these were VT given she had not historically had BBB with her rapid AF, and perhaps syncope (vs seizure).Dr. Lovena Le saw pt in consult and felt it was less likely to be v tach.   She had cardioversion 1/13 which was successful but unfortunately today is back in  afib with RVR, from which she is asymptomatic. She continues to load on amiodarone 200 mg bid. States  no missed doses of eliquis.   Today, she denies symptoms of palpitations, chest pain, shortness of breath, orthopnea, PND, lower extremity edema, dizziness, presyncope, syncope, or neurologic sequela. The patient is tolerating medications without difficulties and is otherwise without complaint today.   Past Medical History:  Diagnosis Date  . Abnormal EKG 07/31/2013  . Arthritis    "hands" (03/06/2015)  . Asthma   . Carpal tunnel syndrome, bilateral   . Chronic kidney disease   . Complication of anesthesia    slow to wake up. Pt sts she woke up during surgery many years ago.  . Coronary artery disease     2 v CAD with CTO of the RCA and high grade bifurcational LCx/OM stenosis. S/P PCI DES x 2 to the LCx/OM.  Marland Kitchen Diabetic peripheral neuropathy ([place]) "since 1996"  . GERD (gastroesophageal reflux disease)   . Goiter   . Headache    migraines prior to menopause  . History of shingles 06/01/2013  . Hyperlipidemia LDL goal <70 10/13/2015  . Hypertension   . Hyperthyroidism   . Osteomyelitis of foot (HCC)    Right  . PAF (paroxysmal atrial fibrillation) ([place]) 04/29/2015   CHADS2VASC score of 5 now on Apixaban  . Pneumonia ~ 1976  . Sleep apnea    Bipap  . Tremors of nervous system   . Type II diabetes mellitus (HCC)    insulin dependent   Past Surgical History:  Procedure Laterality Date  . ABDOMINAL HYSTERECTOMY  1988   age 39; CERVICAL DYSPLASIA; ovaries intact.   Marland Kitchen  AMPUTATION Right 01/23/2016   Procedure: Right 3rd Ray Amputation;  Surgeon: [name], MD;  Location: [facility];  Service: Orthopedics;  Laterality: Right;  . AMPUTATION Right 02/13/2016   Procedure: Right Transmetatarsal Amputation;  Surgeon: [name], MD;  Location: [facility];  Service: Orthopedics;  Laterality: Right;  . AMPUTATION Right 04/15/2018   Procedure: AMPUTATION BELOW KNEE;  Surgeon: [name], MD;  Location: [facility];  Service: Orthopedics;  Laterality: Right;  . BIOPSY  04/16/2018   Procedure: BIOPSY;   Surgeon: Irene Shipper, MD;  Location[facility] ENDOSCOPY;  Service: Endoscopy;;  . CARDIAC CATHETERIZATION N/A 02/27/2015   Procedure: Left Heart Cath and Coronary Angiography;  Surgeon: [name], MD; LAD 40%, mCFX 80%, OM 70%, RCA 100% calcified       . CARDIAC CATHETERIZATION N/A 03/06/2015   Procedure: Coronary Stent Intervention;  Surgeon: [name], MD;  Location: [facility] CV [facility];  Service: Cardiovascular;  Laterality: N/A;  Mid CX 3.50x12 promus DES w/ 0% resdual and Prox OM1 2.50x20 promus DES w/ 20% residual  . CARDIOVERSION    . CARDIOVERSION N/A 05/22/2018   Procedure: CARDIOVERSION;  Surgeon: Skeet Latch, MD;  Location: [facility];  Service: Cardiovascular;  Laterality: N/A;  . CARPAL TUNNEL RELEASE Right Nov 2015  . CARPAL TUNNEL RELEASE Right 1992; 05/2014   [name]; [place]  . CESAREAN SECTION  1982; 1984  . ESOPHAGOGASTRODUODENOSCOPY (EGD) WITH PROPOFOL N/A 04/16/2018   Procedure: ESOPHAGOGASTRODUODENOSCOPY (EGD) WITH PROPOFOL;  Surgeon: Irene Shipper, MD;  Location: [facility] ENDOSCOPY;  Service: Endoscopy;  Laterality: N/A;  . FOOT NEUROMA SURGERY Bilateral 2000  . I&D EXTREMITY Left 01/06/2018   Procedure: DEBRIDEMENT ULCER LEFT FOOT;  Surgeon: [name], MD;  Location: [facility];  Service: Orthopedics;  Laterality: Left;  . KNEE ARTHROSCOPY Right ~ 2003   "meniscus repair"  . SHOULDER OPEN ROTATOR CUFF REPAIR Right 1996; 1998   "w/fracture repair"  . THYROID SURGERY  2000   "removed lots of nodules"  . TONSILLECTOMY  1976    Current Outpatient Medications  Medication Sig Dispense Refill  . acetaminophen (TYLENOL) 325 MG tablet Take 1-2 tablets (325-650 mg total) by mouth every 6 (six) hours as needed for mild pain (pain score 1-3 or temp > 100.5).    Marland Kitchen albuterol (PROVENTIL HFA) 108 (90 Base) MCG/ACT inhaler Inhale 1-2 puffs into the lungs every 6 (six) hours as needed for wheezing or shortness of breath. 1 Inhaler 0  . amiodarone (PACERONE) 200 MG tablet  Take 1 tablet (200 mg total) by mouth 2 (two) times daily. 60 tablet 0  . apixaban (ELIQUIS) 5 MG TABS tablet Take 1 tablet (5 mg total) by mouth 2 (two) times daily. 180 tablet 3  . atorvastatin (LIPITOR) 20 MG tablet Take 1 tablet (20 mg total) by mouth daily. 90 tablet 3  . buPROPion (WELLBUTRIN SR) 150 MG 12 hr tablet Take 1 tablet (150 mg total) by mouth 2 (two) times daily. 60 tablet 1  . diltiazem (CARDIZEM CD) 120 MG 24 hr capsule Take 1 capsule (120 mg total) by mouth daily. 30 capsule 0  . fenofibrate 160 MG tablet Take 1 tablet (160 mg total) by mouth daily. 30 tablet 1  . FLUoxetine (PROZAC) 40 MG capsule Take 1 capsule (40 mg total) by mouth at bedtime. 90 capsule 1  . furosemide (LASIX) 20 MG tablet Take 3 tablets (60 mg total) by mouth daily. 273 tablet 3  . gabapentin (NEURONTIN) 300 MG  capsule Take 3 capsules (900 mg total) by mouth 2 (two) times daily. TAKE 3 CAPSULES BY MOUTH EVERY  MORNING AND 3 CAPSULES AT BEDTIME 540 capsule 0  . glucose blood (TRUE METRIX BLOOD GLUCOSE TEST) test strip Used to check blood sugars 2x daily. 100 each 12  . HYDROcodone-acetaminophen (NORCO/VICODIN) 5-325 MG tablet Take 1-2 tablets by mouth every 4 (four) hours as needed for moderate pain (pain score 4-6). 30 tablet 0  . insulin degludec (TRESIBA FLEXTOUCH) 100 UNIT/ML SOPN FlexTouch Pen Inject 0.15 mLs (15 Units total) into the skin daily. 15 mL 0  . Magnesium 200 MG TABS Take 1 tablet (200 mg total) by mouth daily. 30 each 2  . metFORMIN (GLUCOPHAGE) 1000 MG tablet Take 4,000 mg by mouth daily with breakfast.    . methimazole (TAPAZOLE) 10 MG tablet Take 1 tablet (10 mg total) by mouth daily. 30 tablet 11  . traMADol (ULTRAM) 50 MG tablet Take 50 mg by mouth every 6 (six) hours as needed.     No current facility-administered medications for this encounter.     Allergies  Allergen Reactions  . Contrast Media [Iodinated Diagnostic Agents] Hives and Other (See Comments)    Spoke to patient,  Iodine allergy is really IV contrast allergy.   [name] [Insulin Aspart] Shortness Of Breath and Other (See Comments)    "breathing problems"  . Codeine Nausea And Vomiting and Other (See Comments)    HIGH DOSES-SEVERE VOMITING  . Iodine Other (See Comments)    MUST HAVE BENADRYL PRIOR TO PROCEDURE AND RIGHT BEFORE TREATMENT TO COUNTERACT REACTION-BLISTERING REACTION DERMATOLOGICAL  . Penicillins Itching, Rash and Other (See Comments)    Has patient had a PCN reaction causing immediate rash, facial/tongue/throat swelling, SOB or lightheadedness with hypotension: no Has patient had a PCN reaction causing severe rash involving mucus membranes or skin necrosis: No Has patient had a PCN reaction that required hospitalization No Has patient had a PCN reaction occurring within the last 10 years: No If all of the above answers are "NO", then may proceed with Cephalosporin use.  CHEST SIZED RASH AND ITCHING   . Ace Inhibitors Cough  . Demerol [Meperidine] Nausea And Vomiting  . Dilaudid [Hydromorphone Hcl] Other (See Comments)    HEADACHE   . Neosporin [Neomycin-Bacitracin Zn-Polymyx] Itching, Rash and Other (See Comments)    MAKES REACTIONS WORSE WHEN USING AS PROPHYLACTIC  . Percocet [Oxycodone-Acetaminophen] Rash  . Tape Itching and Rash    Social History   Socioeconomic History  . Marital status: Single    Spouse name: Not on file  . Number of children: 2  . Years of education: Masters  . Highest education level: Not on file  Occupational History  . Occupation: Landscape architect  Social Needs  . Financial resource strain: Somewhat hard  . Food insecurity:    Worry: Sometimes true    Inability: Sometimes true  . Transportation needs:    Medical: No    Non-medical: No  Tobacco Use  . Smoking status: Former Smoker    Packs/day: 0.00    Years: 41.00    Pack years: 0.00    Types: Cigarettes    Last attempt to quit: 03/05/2015    Years since quitting: 3.2  . Smokeless  tobacco: Never Used  . Tobacco comment: 04/29/2015 "quit smoking cigarettes 02/27/2015"  Substance and Sexual Activity  . Alcohol use: No  . Drug use: No  . Sexual activity: Not Currently    Birth control/protection:  Post-menopausal, Surgical  Lifestyle  . Physical activity:    Days per week: 0 days    Minutes per session: 0 min  . Stress: Very much  Relationships  . Social connections:    Talks on phone: More than three times a week    Gets together: More than three times a week    Attends religious service: More than 4 times per year    Active member of club or organization: No    Attends meetings of clubs or organizations: Never    Relationship status: Never married  . Intimate partner violence:    Fear of current or ex partner: No    Emotionally abused: No    Physically abused: No    Forced sexual activity: No  Other Topics Concern  . 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      Lives with sister in [place].      Tobacco: 1 ppd x 41 years - quit 2016      Alcohol: none      Drugs: none      Exercise:  Walking in neighborhood; physical job.   Right-handed.   2 cups caffeine daily.    Family History  Problem Relation Age of Onset  . Cancer Mother 51       bronchial cancer  . Breast cancer Mother   . Lung cancer Mother   . Hypertension Father   . COPD Father   . Heart disease Father 80       CAD with cardiac stenting  . Heart attack Father   . Parkinson's disease Father   . Allergies Sister   . Breast cancer Maternal Grandmother   . Emphysema Maternal Grandfather   . Leukemia Paternal Grandmother   . Emphysema Paternal Grandfather   . Thyroid disease Neg Hx     ROS- All systems are reviewed and negative except as per the HPI  above  Physical Exam: Vitals:   05/30/18 1009  BP: 94/62  Pulse: (!) 130  Weight: 69.4 kg  Height: 5\' 4"  (1.626 m)   Wt Readings from Last 3 Encounters:  05/30/18 69.4 kg  05/26/18 71 kg  05/22/18 71 kg    Labs: Lab Results  Component Value Date   NA 137 05/25/2018   K 5.6 (H) 05/25/2018   CL 99 05/25/2018   CO2 22 05/25/2018   GLUCOSE 153 (H) 05/25/2018   BUN 22 05/25/2018   CREATININE 1.23 (H) 05/25/2018   CALCIUM 10.4 (H) 05/25/2018   PHOS 2.3 (L) 01/05/2018   MG 1.8 05/25/2018   Lab Results  Component Value Date   INR 1.38 04/14/2018   Lab Results  Component Value Date   CHOL 236 (H) 07/22/2017   HDL 30 (L) 07/22/2017   [place] Comment 07/22/2017   TRIG 544 (H) 07/22/2017     GEN- The patient is well appearing, alert and oriented x 3 today.   Head- normocephalic, atraumatic Eyes-  Sclera clear, conjunctiva pink Ears- hearing intact Oropharynx- clear Neck- supple, no JVP Lymph- no cervical lymphadenopathy Lungs- Clear to ausculation bilaterally, normal work of breathing Heart- Rapid irregular rate and rhythm, no murmurs, rubs or gallops, PMI not laterally displaced GI- soft, NT, ND, + BS Extremities- no  clubbing, cyanosis, or edema MS- no significant deformity or atrophy Skin- no rash or lesion Psych- euthymic mood, full affect Neuro- strength and sensation are intact  EKG-afib at 130 bpm, qrs int 82 ms, qtc 494 ms Epic records reviewed   Assessment and Plan: 1. Persistent  afib Sotalol recently stopped and is loading on amiodarone Continue 200 mg bid Unfortunately successful cardioversion 1/13 but with ERAF Is currently on diltiazem 120 mg daily for rate control but with soft BP of 94 systolic, do not feel can increase rate control for fear of worseing hypotension Other than her lasix, she is not on other BP lowering agents that I could stop to allow for more rate control She is tolerating well so will hold the course, let amiodarone load some  longer and bring back in 10 days to arrange another cardioversion.   2.  CHA2DS2VASc of 5 States no missed does of eliquis Continue eliquis 5 mg bid   F/u here in 10 days  Butch Penny C. Vala Raffo, [facility] [address] [place], [place] [address] [phone]

## 2018-05-30 NOTE — Telephone Encounter (Signed)
Called to advise verbal ok for orders below. Lm on vm to call with questions.

## 2018-05-31 ENCOUNTER — Telehealth (INDEPENDENT_AMBULATORY_CARE_PROVIDER_SITE_OTHER): Payer: Self-pay | Admitting: Orthopedic Surgery

## 2018-05-31 DIAGNOSIS — I4819 Other persistent atrial fibrillation: Secondary | ICD-10-CM | POA: Diagnosis not present

## 2018-05-31 DIAGNOSIS — N183 Chronic kidney disease, stage 3 (moderate): Secondary | ICD-10-CM | POA: Diagnosis not present

## 2018-05-31 DIAGNOSIS — I130 Hypertensive heart and chronic kidney disease with heart failure and stage 1 through stage 4 chronic kidney disease, or unspecified chronic kidney disease: Secondary | ICD-10-CM | POA: Diagnosis not present

## 2018-05-31 DIAGNOSIS — I5030 Unspecified diastolic (congestive) heart failure: Secondary | ICD-10-CM | POA: Diagnosis not present

## 2018-05-31 DIAGNOSIS — Z4781 Encounter for orthopedic aftercare following surgical amputation: Secondary | ICD-10-CM | POA: Diagnosis not present

## 2018-05-31 DIAGNOSIS — M19042 Primary osteoarthritis, left hand: Secondary | ICD-10-CM | POA: Diagnosis not present

## 2018-05-31 DIAGNOSIS — M19041 Primary osteoarthritis, right hand: Secondary | ICD-10-CM | POA: Diagnosis not present

## 2018-05-31 DIAGNOSIS — E1122 Type 2 diabetes mellitus with diabetic chronic kidney disease: Secondary | ICD-10-CM | POA: Diagnosis not present

## 2018-05-31 DIAGNOSIS — E1151 Type 2 diabetes mellitus with diabetic peripheral angiopathy without gangrene: Secondary | ICD-10-CM | POA: Diagnosis not present

## 2018-05-31 NOTE — Telephone Encounter (Signed)
Judith [name] from [facility] called to request VO for the following:  HH OT  1x a week for 3 weeks  CB#[phone].  Thank you.

## 2018-06-01 ENCOUNTER — Telehealth (INDEPENDENT_AMBULATORY_CARE_PROVIDER_SITE_OTHER): Payer: Self-pay

## 2018-06-01 ENCOUNTER — Telehealth (INDEPENDENT_AMBULATORY_CARE_PROVIDER_SITE_OTHER): Payer: Self-pay | Admitting: Orthopedic Surgery

## 2018-06-01 DIAGNOSIS — D692 Other nonthrombocytopenic purpura: Secondary | ICD-10-CM | POA: Diagnosis not present

## 2018-06-01 DIAGNOSIS — M19041 Primary osteoarthritis, right hand: Secondary | ICD-10-CM | POA: Diagnosis not present

## 2018-06-01 DIAGNOSIS — I4819 Other persistent atrial fibrillation: Secondary | ICD-10-CM | POA: Diagnosis not present

## 2018-06-01 DIAGNOSIS — Z7901 Long term (current) use of anticoagulants: Secondary | ICD-10-CM | POA: Diagnosis not present

## 2018-06-01 DIAGNOSIS — N183 Chronic kidney disease, stage 3 (moderate): Secondary | ICD-10-CM | POA: Diagnosis not present

## 2018-06-01 DIAGNOSIS — I509 Heart failure, unspecified: Secondary | ICD-10-CM | POA: Diagnosis not present

## 2018-06-01 DIAGNOSIS — I5030 Unspecified diastolic (congestive) heart failure: Secondary | ICD-10-CM | POA: Diagnosis not present

## 2018-06-01 DIAGNOSIS — Z993 Dependence on wheelchair: Secondary | ICD-10-CM | POA: Diagnosis not present

## 2018-06-01 DIAGNOSIS — E1165 Type 2 diabetes mellitus with hyperglycemia: Secondary | ICD-10-CM | POA: Diagnosis not present

## 2018-06-01 DIAGNOSIS — E1159 Type 2 diabetes mellitus with other circulatory complications: Secondary | ICD-10-CM | POA: Diagnosis not present

## 2018-06-01 DIAGNOSIS — E1151 Type 2 diabetes mellitus with diabetic peripheral angiopathy without gangrene: Secondary | ICD-10-CM | POA: Diagnosis not present

## 2018-06-01 DIAGNOSIS — Z6826 Body mass index (BMI) 26.0-26.9, adult: Secondary | ICD-10-CM | POA: Diagnosis not present

## 2018-06-01 DIAGNOSIS — I130 Hypertensive heart and chronic kidney disease with heart failure and stage 1 through stage 4 chronic kidney disease, or unspecified chronic kidney disease: Secondary | ICD-10-CM | POA: Diagnosis not present

## 2018-06-01 DIAGNOSIS — E1122 Type 2 diabetes mellitus with diabetic chronic kidney disease: Secondary | ICD-10-CM | POA: Diagnosis not present

## 2018-06-01 DIAGNOSIS — Z4781 Encounter for orthopedic aftercare following surgical amputation: Secondary | ICD-10-CM | POA: Diagnosis not present

## 2018-06-01 DIAGNOSIS — Z87891 Personal history of nicotine dependence: Secondary | ICD-10-CM | POA: Diagnosis not present

## 2018-06-01 DIAGNOSIS — M19042 Primary osteoarthritis, left hand: Secondary | ICD-10-CM | POA: Diagnosis not present

## 2018-06-01 NOTE — Telephone Encounter (Signed)
Done

## 2018-06-01 NOTE — Telephone Encounter (Signed)
[facility] called you back and stated had some additional questions to ask about orders/patient.  Please call her back @ [phone]

## 2018-06-01 NOTE — Telephone Encounter (Signed)
I called and spoke to [facility] about her concerns for patient and advised her that we will try and get her in for an earlier appt. I called patient and left her a message to return our call.

## 2018-06-01 NOTE — Telephone Encounter (Signed)
I called and left vm to have patient to come in early for her appt with Dr Sharol Given if possible today if not any day next week for her left foot concerns per [facility].

## 2018-06-02 ENCOUNTER — Ambulatory Visit: Payer: Medicare PPO | Admitting: Family Medicine

## 2018-06-02 DIAGNOSIS — E1151 Type 2 diabetes mellitus with diabetic peripheral angiopathy without gangrene: Secondary | ICD-10-CM | POA: Diagnosis not present

## 2018-06-02 DIAGNOSIS — M19041 Primary osteoarthritis, right hand: Secondary | ICD-10-CM | POA: Diagnosis not present

## 2018-06-02 DIAGNOSIS — N183 Chronic kidney disease, stage 3 (moderate): Secondary | ICD-10-CM | POA: Diagnosis not present

## 2018-06-02 DIAGNOSIS — E1122 Type 2 diabetes mellitus with diabetic chronic kidney disease: Secondary | ICD-10-CM | POA: Diagnosis not present

## 2018-06-02 DIAGNOSIS — M19042 Primary osteoarthritis, left hand: Secondary | ICD-10-CM | POA: Diagnosis not present

## 2018-06-02 DIAGNOSIS — I130 Hypertensive heart and chronic kidney disease with heart failure and stage 1 through stage 4 chronic kidney disease, or unspecified chronic kidney disease: Secondary | ICD-10-CM | POA: Diagnosis not present

## 2018-06-02 DIAGNOSIS — I4819 Other persistent atrial fibrillation: Secondary | ICD-10-CM | POA: Diagnosis not present

## 2018-06-02 DIAGNOSIS — Z4781 Encounter for orthopedic aftercare following surgical amputation: Secondary | ICD-10-CM | POA: Diagnosis not present

## 2018-06-02 DIAGNOSIS — I5030 Unspecified diastolic (congestive) heart failure: Secondary | ICD-10-CM | POA: Diagnosis not present

## 2018-06-05 ENCOUNTER — Ambulatory Visit (INDEPENDENT_AMBULATORY_CARE_PROVIDER_SITE_OTHER): Payer: Medicare PPO | Admitting: Physician Assistant

## 2018-06-05 ENCOUNTER — Encounter (INDEPENDENT_AMBULATORY_CARE_PROVIDER_SITE_OTHER): Payer: Self-pay | Admitting: Physician Assistant

## 2018-06-05 VITALS — Ht 64.0 in | Wt 153.0 lb

## 2018-06-05 DIAGNOSIS — N183 Chronic kidney disease, stage 3 unspecified: Secondary | ICD-10-CM

## 2018-06-05 DIAGNOSIS — E1129 Type 2 diabetes mellitus with other diabetic kidney complication: Secondary | ICD-10-CM

## 2018-06-05 DIAGNOSIS — L84 Corns and callosities: Secondary | ICD-10-CM

## 2018-06-05 DIAGNOSIS — M7541 Impingement syndrome of right shoulder: Secondary | ICD-10-CM

## 2018-06-05 DIAGNOSIS — Z794 Long term (current) use of insulin: Secondary | ICD-10-CM

## 2018-06-05 DIAGNOSIS — Z89511 Acquired absence of right leg below knee: Secondary | ICD-10-CM

## 2018-06-05 DIAGNOSIS — E1142 Type 2 diabetes mellitus with diabetic polyneuropathy: Secondary | ICD-10-CM

## 2018-06-05 NOTE — Progress Notes (Signed)
Office Visit Note   Patient: Judith [name]           Date of Birth: 1950/12/12           MRN: [id number] Visit Date: 06/05/2018              Requested by: Rutherford Guys, MD [address] [place], [place] [address] PCP: Rutherford Guys, MD  Chief Complaint  Patient presents with  . Left Foot - Pain  . Right Leg - Routine Post Op    04/15/18 right BKA       HPI: The patient is a 68 year old woman here for postoperative follow-up following a right transtibial amputation on 04/15/2018 as well as evaluation of a new callus over her left midfoot.  She reports no problems with the right transtibial amputation site.  She is concerned about the left foot callus.  She has been working [facility] for her stump shrinker stockings. She is having continued issues with her shoulder on the right and would like to be able to have surgery on this soon but needs to follow-up with cardiology first to make sure they are okay with her having another surgical procedure.  Assessment & Plan: Visit Diagnoses:  1. Callus of foot   2. Acquired absence of right leg below knee (HCC)   3. Impingement syndrome of right shoulder   4. Type 2 diabetes mellitus with diabetic polyneuropathy, with long-term current use of insulin ([place])   5. CKD (chronic kidney disease), stage III ([place])     Plan: After informed consent the left foot midfoot callus was debrided.  The patient was instructed in Achilles stretches for the left foot.  Also recommended a AmLactin lotion to help prevent further callus formation.  She will continue her stump shrinker on her right transtibial amputation site and can wash this with soap and water daily.  We will have her follow-up here on February 7 as previously scheduled or sooner should she have difficulties in the interim.  Follow-Up Instructions: Return in about 11 days (around 06/16/2018).   Ortho Exam  Patient is alert, oriented, no adenopathy, well-dressed, normal affect,  normal respiratory effort. The right transtibial amputation site is healing well with some scattered crusting.  No signs of infection.  Edema continues to improve.  The left foot has a callus over the midfoot which is partially raised.  There is no underlying ulcer.  This was debrided and the patient tolerated this well.  There are no signs of infection or cellulitis in the left foot.  She has good pedal pulses.  Imaging: No results found.   Labs: Lab Results  Component Value Date   HGBA1C 8.0 (H) 04/14/2018   HGBA1C 7.9 (A) 04/03/2018   HGBA1C 7.7 (A) 01/30/2018   ESRSEDRATE 133 (H) 01/02/2018   CRP 23.5 (H) 01/02/2018   REPTSTATUS 05/20/2018 FINAL 05/18/2018   GRAMSTAIN  01/06/2018    MODERATE WBC PRESENT, PREDOMINANTLY PMN RARE GRAM POSITIVE COCCI    CULT 20,000 COLONIES/mL SERRATIA MARCESCENS (A) 05/18/2018   LABORGA SERRATIA MARCESCENS (A) 05/18/2018     Lab Results  Component Value Date   ALBUMIN 4.2 05/25/2018   ALBUMIN 3.6 05/14/2018   ALBUMIN 2.5 (L) 04/21/2018    Body mass index is 26.26 kg/m.  Orders:  No orders of the defined types were placed in this encounter.  No orders of the defined types were placed in this encounter.    Procedures: No procedures performed  Clinical  Data: No additional findings.  ROS:  All other systems negative, except as noted in the HPI. Review of Systems  Objective: Vital Signs: Ht 5\' 4"  (1.626 m)   Wt 153 lb (69.4 kg)   BMI 26.26 kg/m   Specialty Comments:  No specialty comments available.  PMFS History: Patient Active Problem List   Diagnosis Date Noted  . V-tach ([place]) 05/14/2018  . Seizure ([place]) 05/14/2018  . Slow transit constipation   . Fall   . Labile blood glucose   . Labile blood pressure   . Chronic combined systolic and diastolic congestive heart failure ([place])   . Chronic diastolic congestive heart failure ([place])   . CKD (chronic kidney disease), stage III ([place])   . Type 2 diabetes mellitus with  peripheral neuropathy (HCC)   . Amputation of right lower extremity below knee ([place]) 04/20/2018  . Unilateral traumatic amputation of right leg below knee with complication, initial encounter ([place])   . Post-operative pain   . PAF (paroxysmal atrial fibrillation) ([place])   . Duodenal ulcer   . Atrial fibrillation with RVR ([place]) 04/14/2018  . Foot osteomyelitis, right ([place]) 04/14/2018  . Cutaneous abscess of left foot   . Acute respiratory failure with hypoxia ([place])   . Cellulitis 01/02/2018  . LGI bleed   . Acute blood loss anemia   . Wide-complex tachycardia ([place]) 07/04/2017  . Type II diabetes mellitus, uncontrolled ([place]) 07/04/2017  . Peripheral neuropathy 07/04/2017  . H/O hyperthyroidism 07/04/2017  . Atrial fibrillation with rapid ventricular response ([place]) 08/06/2016  . S/P transmetatarsal amputation of foot, right ([place]) 04/19/2016  . Obstructive sleep apnea 11/26/2015  . Bilateral carpal tunnel syndrome 11/26/2015  . Hypomagnesemia 11/16/2015  . Hyperlipidemia LDL goal <70 10/13/2015  . Abnormality of gait 09/02/2015  . Memory loss 08/12/2015  . Diabetic peripheral neuropathy ([place]) 08/12/2015  . Vitamin D deficiency 08/12/2015  . Hyperthyroidism 04/30/2015  . Heme positive stool   . Persistent atrial fibrillation 04/29/2015  . Coronary artery disease with stable angina pectoris ([place]) 03/29/2015  . Abnormal nuclear stress test   . History of goiter 09/28/2014  . GERD (gastroesophageal reflux disease) 07/31/2013  . Depression with anxiety 06/01/2013  . DM (diabetes mellitus), type 2 with peripheral vascular complications ([place]) 03/54/6568  . HTN (hypertension) 01/30/2013   Past Medical History:  Diagnosis Date  . Abnormal EKG 07/31/2013  . Arthritis    "hands" (03/06/2015)  . Asthma   . Carpal tunnel syndrome, bilateral   . Chronic kidney disease   . Complication of anesthesia    slow to wake up. Pt sts she woke up during surgery many years ago.  . Coronary artery  disease     2 v CAD with CTO of the RCA and high grade bifurcational LCx/OM stenosis. S/P PCI DES x 2 to the LCx/OM.  Marland Kitchen Diabetic peripheral neuropathy ([place]) "since 1996"  . GERD (gastroesophageal reflux disease)   . Goiter   . Headache    migraines prior to menopause  . History of shingles 06/01/2013  . Hyperlipidemia LDL goal <70 10/13/2015  . Hypertension   . Hyperthyroidism   . Osteomyelitis of foot (HCC)    Right  . PAF (paroxysmal atrial fibrillation) ([place]) 04/29/2015   CHADS2VASC score of 5 now on Apixaban  . Pneumonia ~ 1976  . Sleep apnea    Bipap  . Tremors of nervous system   . Type II diabetes mellitus (HCC)    insulin dependent    Family History  Problem Relation Age of Onset  . Cancer Mother 56       bronchial cancer  . Breast cancer Mother   . Lung cancer Mother   . Hypertension Father   . COPD Father   . Heart disease Father 23       CAD with cardiac stenting  . Heart attack Father   . Parkinson's disease Father   . Allergies Sister   . Breast cancer Maternal Grandmother   . Emphysema Maternal Grandfather   . Leukemia Paternal Grandmother   . Emphysema Paternal Grandfather   . Thyroid disease Neg Hx     Past Surgical History:  Procedure Laterality Date  . ABDOMINAL HYSTERECTOMY  1988   age 32; CERVICAL DYSPLASIA; ovaries intact.   . AMPUTATION Right 01/23/2016   Procedure: Right 3rd Ray Amputation;  Surgeon: [name], MD;  Location: [facility];  Service: Orthopedics;  Laterality: Right;  . AMPUTATION Right 02/13/2016   Procedure: Right Transmetatarsal Amputation;  Surgeon: [name], MD;  Location: [facility];  Service: Orthopedics;  Laterality: Right;  . AMPUTATION Right 04/15/2018   Procedure: AMPUTATION BELOW KNEE;  Surgeon: [name], MD;  Location: [facility];  Service: Orthopedics;  Laterality: Right;  . BIOPSY  04/16/2018   Procedure: BIOPSY;  Surgeon: Irene Shipper, MD;  Location[facility] ENDOSCOPY;  Service: Endoscopy;;  . CARDIAC CATHETERIZATION N/A  02/27/2015   Procedure: Left Heart Cath and Coronary Angiography;  Surgeon: [name], MD; LAD 40%, mCFX 80%, OM 70%, RCA 100% calcified       . CARDIAC CATHETERIZATION N/A 03/06/2015   Procedure: Coronary Stent Intervention;  Surgeon: [name], MD;  Location: [facility] CV LAB;  Service: Cardiovascular;  Laterality: N/A;  Mid CX 3.50x12 promus DES w/ 0% resdual and Prox OM1 2.50x20 promus DES w/ 20% residual  . CARDIOVERSION    . CARDIOVERSION N/A 05/22/2018   Procedure: CARDIOVERSION;  Surgeon: Skeet Latch, MD;  Location: [facility];  Service: Cardiovascular;  Laterality: N/A;  . CARPAL TUNNEL RELEASE Right Nov 2015  . CARPAL TUNNEL RELEASE Right 1992; 05/2014   [name]; [place]  . CESAREAN SECTION  1982; 1984  . ESOPHAGOGASTRODUODENOSCOPY (EGD) WITH PROPOFOL N/A 04/16/2018   Procedure: ESOPHAGOGASTRODUODENOSCOPY (EGD) WITH PROPOFOL;  Surgeon: Irene Shipper, MD;  Location: [facility] ENDOSCOPY;  Service: Endoscopy;  Laterality: N/A;  . FOOT NEUROMA SURGERY Bilateral 2000  . I&D EXTREMITY Left 01/06/2018   Procedure: DEBRIDEMENT ULCER LEFT FOOT;  Surgeon: [name], MD;  Location: [facility];  Service: Orthopedics;  Laterality: Left;  . KNEE ARTHROSCOPY Right ~ 2003   "meniscus repair"  . SHOULDER OPEN ROTATOR CUFF REPAIR Right 1996; 1998   "w/fracture repair"  . THYROID SURGERY  2000   "removed lots of nodules"  . TONSILLECTOMY  1976   Social History   Occupational History  . Occupation: Landscape architect  Tobacco Use  . Smoking status: Former Smoker    Packs/day: 0.00    Years: 41.00    Pack years: 0.00    Types: Cigarettes    Last attempt to quit: 03/05/2015    Years since quitting: 3.2  . Smokeless tobacco: Never Used  . Tobacco comment: 04/29/2015 "quit smoking cigarettes 02/27/2015"  Substance and Sexual Activity  . Alcohol use: No  . Drug use: No  . Sexual activity: Not Currently    Birth control/protection: Post-menopausal, Surgical

## 2018-06-06 DIAGNOSIS — I130 Hypertensive heart and chronic kidney disease with heart failure and stage 1 through stage 4 chronic kidney disease, or unspecified chronic kidney disease: Secondary | ICD-10-CM | POA: Diagnosis not present

## 2018-06-06 DIAGNOSIS — M19042 Primary osteoarthritis, left hand: Secondary | ICD-10-CM | POA: Diagnosis not present

## 2018-06-06 DIAGNOSIS — Z4781 Encounter for orthopedic aftercare following surgical amputation: Secondary | ICD-10-CM | POA: Diagnosis not present

## 2018-06-06 DIAGNOSIS — I5030 Unspecified diastolic (congestive) heart failure: Secondary | ICD-10-CM | POA: Diagnosis not present

## 2018-06-06 DIAGNOSIS — M19041 Primary osteoarthritis, right hand: Secondary | ICD-10-CM | POA: Diagnosis not present

## 2018-06-06 DIAGNOSIS — E1122 Type 2 diabetes mellitus with diabetic chronic kidney disease: Secondary | ICD-10-CM | POA: Diagnosis not present

## 2018-06-06 DIAGNOSIS — E1151 Type 2 diabetes mellitus with diabetic peripheral angiopathy without gangrene: Secondary | ICD-10-CM | POA: Diagnosis not present

## 2018-06-06 DIAGNOSIS — I4819 Other persistent atrial fibrillation: Secondary | ICD-10-CM | POA: Diagnosis not present

## 2018-06-06 DIAGNOSIS — N183 Chronic kidney disease, stage 3 (moderate): Secondary | ICD-10-CM | POA: Diagnosis not present

## 2018-06-07 ENCOUNTER — Encounter: Payer: Self-pay | Admitting: Endocrinology

## 2018-06-07 ENCOUNTER — Telehealth: Payer: Self-pay | Admitting: Family Medicine

## 2018-06-07 ENCOUNTER — Ambulatory Visit (INDEPENDENT_AMBULATORY_CARE_PROVIDER_SITE_OTHER): Payer: Medicare PPO | Admitting: Endocrinology

## 2018-06-07 ENCOUNTER — Encounter (INDEPENDENT_AMBULATORY_CARE_PROVIDER_SITE_OTHER): Payer: Self-pay | Admitting: Physician Assistant

## 2018-06-07 VITALS — BP 128/82 | HR 70 | Ht 64.0 in | Wt 153.0 lb

## 2018-06-07 DIAGNOSIS — N183 Chronic kidney disease, stage 3 (moderate): Secondary | ICD-10-CM | POA: Diagnosis not present

## 2018-06-07 DIAGNOSIS — I130 Hypertensive heart and chronic kidney disease with heart failure and stage 1 through stage 4 chronic kidney disease, or unspecified chronic kidney disease: Secondary | ICD-10-CM | POA: Diagnosis not present

## 2018-06-07 DIAGNOSIS — E1101 Type 2 diabetes mellitus with hyperosmolarity with coma: Secondary | ICD-10-CM

## 2018-06-07 DIAGNOSIS — I4819 Other persistent atrial fibrillation: Secondary | ICD-10-CM | POA: Diagnosis not present

## 2018-06-07 DIAGNOSIS — Z794 Long term (current) use of insulin: Secondary | ICD-10-CM | POA: Diagnosis not present

## 2018-06-07 DIAGNOSIS — M19042 Primary osteoarthritis, left hand: Secondary | ICD-10-CM | POA: Diagnosis not present

## 2018-06-07 DIAGNOSIS — I5030 Unspecified diastolic (congestive) heart failure: Secondary | ICD-10-CM | POA: Diagnosis not present

## 2018-06-07 DIAGNOSIS — Z4781 Encounter for orthopedic aftercare following surgical amputation: Secondary | ICD-10-CM | POA: Diagnosis not present

## 2018-06-07 DIAGNOSIS — E1122 Type 2 diabetes mellitus with diabetic chronic kidney disease: Secondary | ICD-10-CM | POA: Diagnosis not present

## 2018-06-07 DIAGNOSIS — E1151 Type 2 diabetes mellitus with diabetic peripheral angiopathy without gangrene: Secondary | ICD-10-CM | POA: Diagnosis not present

## 2018-06-07 DIAGNOSIS — M19041 Primary osteoarthritis, right hand: Secondary | ICD-10-CM | POA: Diagnosis not present

## 2018-06-07 LAB — POCT GLYCOSYLATED HEMOGLOBIN (HGB A1C): Hemoglobin A1C: 7.5 % — AB (ref 4.0–5.6)

## 2018-06-07 MED ORDER — METFORMIN HCL 1000 MG PO TABS: 1000.0000 mg | ORAL_TABLET | Freq: Every day | ORAL | 11 refills | Status: DC

## 2018-06-07 NOTE — Progress Notes (Signed)
Subjective:    Patient ID: Judith [name], female    DOB: 03/16/1951, 68 y.o.   MRN: [id number]  HPI Pt returns for f/u of hyperthyroidism (pt says she took synthroid for a brief time in the 1990's, in an attempt to shrink a goiter; slightly suppressed TSH was first noted in late 2016, when she was in the hospital for new-onset AF; she converted back to SR; nuc med scan showed heterogeneous uptake; neck scar is from C-spine procedure). she takes tapazole as rx'ed.  pt states she feels well in general.   Pt also ret for f/u of DM: DM type: Insulin-requiring type 2. Dx'ed: 3244 Complications: polyneuropathy, renal failure, retinopathy, right BKA, and CAD.   Therapy: insulin since 1997, and metformin.   GDM: never.  DKA: never. Severe hypoglycemia: never.   Pancreatitis: never.   Other: she said reg and novolog both caused sob, so she was changed to qd insulin Interval history:   no cbg record, but states cbg's vary from 64-161.  It is in general higher as the day goes on. She takes tresiba, 15 units QHS.  Past Medical History:  Diagnosis Date  . Abnormal EKG 07/31/2013  . Arthritis    "hands" (03/06/2015)  . Asthma   . Carpal tunnel syndrome, bilateral   . Chronic kidney disease   . Complication of anesthesia    slow to wake up. Pt sts she woke up during surgery many years ago.  . Coronary artery disease     2 v CAD with CTO of the RCA and high grade bifurcational LCx/OM stenosis. S/P PCI DES x 2 to the LCx/OM.  Marland Kitchen Diabetic peripheral neuropathy ([place]) "since 1996"  . GERD (gastroesophageal reflux disease)   . Goiter   . Headache    migraines prior to menopause  . History of shingles 06/01/2013  . Hyperlipidemia LDL goal <70 10/13/2015  . Hypertension   . Hyperthyroidism   . Osteomyelitis of foot (HCC)    Right  . PAF (paroxysmal atrial fibrillation) ([place]) 04/29/2015   CHADS2VASC score of 5 now on Apixaban  . Pneumonia ~ 1976  . Sleep apnea    Bipap  . Tremors of nervous  system   . Type II diabetes mellitus (HCC)    insulin dependent    Past Surgical History:  Procedure Laterality Date  . ABDOMINAL HYSTERECTOMY  1988   age 31; CERVICAL DYSPLASIA; ovaries intact.   . AMPUTATION Right 01/23/2016   Procedure: Right 3rd Ray Amputation;  Surgeon: [name], MD;  Location: [facility];  Service: Orthopedics;  Laterality: Right;  . AMPUTATION Right 02/13/2016   Procedure: Right Transmetatarsal Amputation;  Surgeon: [name], MD;  Location: [facility];  Service: Orthopedics;  Laterality: Right;  . AMPUTATION Right 04/15/2018   Procedure: AMPUTATION BELOW KNEE;  Surgeon: [name], MD;  Location: [facility];  Service: Orthopedics;  Laterality: Right;  . BIOPSY  04/16/2018   Procedure: BIOPSY;  Surgeon: Irene Shipper, MD;  Location[facility] ENDOSCOPY;  Service: Endoscopy;;  . CARDIAC CATHETERIZATION N/A 02/27/2015   Procedure: Left Heart Cath and Coronary Angiography;  Surgeon: [name], MD; LAD 40%, mCFX 80%, OM 70%, RCA 100% calcified       . CARDIAC CATHETERIZATION N/A 03/06/2015   Procedure: Coronary Stent Intervention;  Surgeon: [name], MD;  Location: [facility] CV LAB;  Service: Cardiovascular;  Laterality: N/A;  Mid CX 3.50x12 promus DES w/ 0% resdual and Prox OM1 2.50x20 promus DES w/  20% residual  . CARDIOVERSION    . CARDIOVERSION N/A 05/22/2018   Procedure: CARDIOVERSION;  Surgeon: Skeet Latch, MD;  Location: [facility];  Service: Cardiovascular;  Laterality: N/A;  . CARPAL TUNNEL RELEASE Right Nov 2015  . CARPAL TUNNEL RELEASE Right 1992; 05/2014   [name]; [place]  . CESAREAN SECTION  1982; 1984  . ESOPHAGOGASTRODUODENOSCOPY (EGD) WITH PROPOFOL N/A 04/16/2018   Procedure: ESOPHAGOGASTRODUODENOSCOPY (EGD) WITH PROPOFOL;  Surgeon: Irene Shipper, MD;  Location: [facility] ENDOSCOPY;  Service: Endoscopy;  Laterality: N/A;  . FOOT NEUROMA SURGERY Bilateral 2000  . I&D EXTREMITY Left 01/06/2018   Procedure: DEBRIDEMENT ULCER LEFT FOOT;  Surgeon: [name], MD;  Location: [facility];  Service: Orthopedics;  Laterality: Left;  . KNEE ARTHROSCOPY Right ~ 2003   "meniscus repair"  . SHOULDER OPEN ROTATOR CUFF REPAIR Right 1996; 1998   "w/fracture repair"  . THYROID SURGERY  2000   "removed lots of nodules"  . TONSILLECTOMY  1976    Social History   Socioeconomic History  . Marital status: Single    Spouse name: Not on file  . Number of children: 2  . Years of education: Masters  . Highest education level: Not on file  Occupational History  . Occupation: Landscape architect  Social Needs  . Financial resource strain: Somewhat hard  . Food insecurity:    Worry: Sometimes true    Inability: Sometimes true  . Transportation needs:    Medical: No    Non-medical: No  Tobacco Use  . Smoking status: Former Smoker    Packs/day: 0.00    Years: 41.00    Pack years: 0.00    Types: Cigarettes    Last attempt to quit: 03/05/2015    Years since quitting: 3.2  . Smokeless tobacco: Never Used  . Tobacco comment: 04/29/2015 "quit smoking cigarettes 02/27/2015"  Substance and Sexual Activity  . Alcohol use: No  . Drug use: No  . Sexual activity: Not Currently    Birth control/protection: Post-menopausal, Surgical  Lifestyle  . Physical activity:    Days per week: 0 days    Minutes per session: 0 min  . Stress: Very much  Relationships  . Social connections:    Talks on phone: More than three times a week    Gets together: More than three times a week    Attends religious service: More than 4 times per year    Active member of club or organization: No    Attends meetings of clubs or organizations: Never    Relationship status: Never married  . Intimate partner violence:    Fear of current or ex partner: No    Emotionally abused: No    Physically abused: No    Forced sexual activity: No  Other Topics Concern  . 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      Lives with sister in [place].      Tobacco: 1 ppd x 41 years - quit 2016      Alcohol: none      Drugs: none      Exercise:  Walking in neighborhood; physical job.   Right-handed.   2 cups caffeine daily.    Current Outpatient Medications on  File Prior to Visit  Medication Sig Dispense Refill  . acetaminophen (TYLENOL) 325 MG tablet Take 1-2 tablets (325-650 mg total) by mouth every 6 (six) hours as needed for mild pain (pain score 1-3 or temp > 100.5).    Marland Kitchen albuterol (PROVENTIL HFA) 108 (90 Base) MCG/ACT inhaler Inhale 1-2 puffs into the lungs every 6 (six) hours as needed for wheezing or shortness of breath. 1 Inhaler 0  . apixaban (ELIQUIS) 5 MG TABS tablet Take 1 tablet (5 mg total) by mouth 2 (two) times daily. 180 tablet 3  . atorvastatin (LIPITOR) 20 MG tablet Take 1 tablet (20 mg total) by mouth daily. 90 tablet 3  . buPROPion (WELLBUTRIN SR) 150 MG 12 hr tablet Take 1 tablet (150 mg total) by mouth 2 (two) times daily. 60 tablet 1  . diltiazem (CARDIZEM CD) 120 MG 24 hr capsule Take 1 capsule (120 mg total) by mouth daily. 30 capsule 0  . fenofibrate 160 MG tablet Take 1 tablet (160 mg total) by mouth daily. 30 tablet 1  . FLUoxetine (PROZAC) 40 MG capsule Take 1 capsule (40 mg total) by mouth at bedtime. 90 capsule 1  . furosemide (LASIX) 20 MG tablet Take 3 tablets (60 mg total) by mouth daily. 273 tablet 3  . gabapentin (NEURONTIN) 300 MG capsule Take 3 capsules (900 mg total) by mouth 2 (two) times daily. TAKE 3 CAPSULES BY MOUTH EVERY  MORNING AND 3 CAPSULES AT BEDTIME 540 capsule 0  . glucose blood (TRUE METRIX BLOOD GLUCOSE TEST) test strip Used to check blood sugars 2x daily. 100 each 12  . HYDROcodone-acetaminophen (NORCO/VICODIN) 5-325 MG tablet Take 1-2 tablets by mouth every 4 (four) hours as needed for moderate  pain (pain score 4-6). 30 tablet 0  . insulin degludec (TRESIBA FLEXTOUCH) 100 UNIT/ML SOPN FlexTouch Pen Inject 0.15 mLs (15 Units total) into the skin daily. 15 mL 0  . methimazole (TAPAZOLE) 10 MG tablet Take 1 tablet (10 mg total) by mouth daily. 30 tablet 11  . traMADol (ULTRAM) 50 MG tablet Take 50 mg by mouth every 6 (six) hours as needed.     No current facility-administered medications on file prior to visit.     Allergies  Allergen Reactions  . Contrast Media [Iodinated Diagnostic Agents] Hives and Other (See Comments)    Spoke to patient, Iodine allergy is really IV contrast allergy.   [name] [Insulin Aspart] Shortness Of Breath and Other (See Comments)    "breathing problems"  . Codeine Nausea And Vomiting and Other (See Comments)    HIGH DOSES-SEVERE VOMITING  . Iodine Other (See Comments)    MUST HAVE BENADRYL PRIOR TO PROCEDURE AND RIGHT BEFORE TREATMENT TO COUNTERACT REACTION-BLISTERING REACTION DERMATOLOGICAL  . Penicillins Itching, Rash and Other (See Comments)    Has patient had a PCN reaction causing immediate rash, facial/tongue/throat swelling, SOB or lightheadedness with hypotension: no Has patient had a PCN reaction causing severe rash involving mucus membranes or skin necrosis: No Has patient had a PCN reaction that required hospitalization No Has patient had a PCN reaction occurring within the last 10 years: No If all of the above answers are "NO", then may proceed with Cephalosporin use.  CHEST SIZED RASH AND ITCHING   . Ace Inhibitors Cough  . Demerol [Meperidine] Nausea And Vomiting  . Dilaudid [Hydromorphone Hcl] Other (See Comments)    HEADACHE   . Neosporin [Neomycin-Bacitracin Zn-Polymyx] Itching, Rash and Other (See Comments)    MAKES REACTIONS WORSE WHEN  USING AS PROPHYLACTIC  . Percocet [Oxycodone-Acetaminophen] Rash  . Tape Itching and Rash    Family History  Problem Relation Age of Onset  . Cancer Mother 58       bronchial cancer  .  Breast cancer Mother   . Lung cancer Mother   . Hypertension Father   . COPD Father   . Heart disease Father 74       CAD with cardiac stenting  . Heart attack Father   . Parkinson's disease Father   . Allergies Sister   . Breast cancer Maternal Grandmother   . Emphysema Maternal Grandfather   . Leukemia Paternal Grandmother   . Emphysema Paternal Grandfather   . Thyroid disease Neg Hx     BP 128/82 (BP Location: Left Arm, Patient Position: Sitting, Cuff Size: Normal)   Pulse 70   Ht '5\' 4"'  (1.626 m)   Wt 153 lb (69.4 kg)   SpO2 98%   BMI 26.26 kg/m    Review of Systems She denies LOC    Objective:   Physical Exam VITAL SIGNS:  See vs page GENERAL: no distress.  In wheelchair Pulses: left foot pulses are intact.   MSK: no deformity, except for right BKA CV: no edema of the legs.   Skin:  No ulcer on the feet or ankles.  normal temp on the left foot and ankle. Neuro: sensation is intact to touch on the left foot and ankle, but severely decreased from normal Ext: There is onychomycosis of the left foot toenails.    Lab Results  Component Value Date   TSH 1.013 05/15/2018   T4TOTAL 7.8 04/30/2015   Lab Results  Component Value Date   CREATININE 1.23 (H) 05/25/2018   BUN 22 05/25/2018   NA 137 05/25/2018   K 5.6 (H) 05/25/2018   CL 99 05/25/2018   CO2 22 05/25/2018   A1c=7.5%     Assessment & Plan:  Insulin-requiring type 2 DM, with CAD: this is the best control this pt should aim for, given this regimen, which does match insulin to her changing needs throughout the day Renal insuff: in this setting, metformin is chosen for reduction.  Hyperthyroidism: well-controlled   Patient Instructions  check your blood sugar twice a day.  vary the time of day when you check, between before the 3 meals, and at bedtime.  also check if you have symptoms of your blood sugar being too high or too low.  please keep a record of the readings and bring it to your next  appointment here (or you can bring the meter itself).  You can write it on any piece of paper.  please call us sooner if your blood sugar goes below 70, or if you have a lot of readings over 200.  Please reduce the metformin to 1000 mg once per day, and: Please continue the same tresiba Please come back for a follow-up appointment in 2 months.

## 2018-06-07 NOTE — Patient Instructions (Addendum)
check your blood sugar twice a day.  vary the time of day when you check, between before the 3 meals, and at bedtime.  also check if you have symptoms of your blood sugar being too high or too low.  please keep a record of the readings and bring it to your next appointment here (or you can bring the meter itself).  You can write it on any piece of paper.  please call us sooner if your blood sugar goes below 70, or if you have a lot of readings over 200.  Please reduce the metformin to 1000 mg once per day, and: Please continue the same tresiba Please come back for a follow-up appointment in 2 months.

## 2018-06-07 NOTE — Telephone Encounter (Signed)
Copied from [place] [id number]. Topic: General - Other >> Jun 07, 2018 12:15 PM Windy Kalata wrote: Reason for CRM: Patient is reporting that her home nurse from [facility] just left and that she advised her to report to Dr. Pamella Pert that she is vomiting, and she is supposed to have a magnesium test, please advise  Best call back is [phone]

## 2018-06-08 ENCOUNTER — Encounter (HOSPITAL_COMMUNITY): Payer: Self-pay | Admitting: Nurse Practitioner

## 2018-06-08 ENCOUNTER — Telehealth (INDEPENDENT_AMBULATORY_CARE_PROVIDER_SITE_OTHER): Payer: Self-pay | Admitting: Orthopedic Surgery

## 2018-06-08 ENCOUNTER — Ambulatory Visit (HOSPITAL_COMMUNITY)
Admission: RE | Admit: 2018-06-08 | Discharge: 2018-06-08 | Disposition: A | Discharge status: Home / Self Care | Payer: Medicare PPO | Source: Ambulatory Visit | Attending: Nurse Practitioner | Admitting: Nurse Practitioner

## 2018-06-08 VITALS — BP 142/84 | HR 79 | Ht 64.0 in | Wt 153.0 lb

## 2018-06-08 DIAGNOSIS — E1122 Type 2 diabetes mellitus with diabetic chronic kidney disease: Secondary | ICD-10-CM | POA: Diagnosis not present

## 2018-06-08 DIAGNOSIS — N183 Chronic kidney disease, stage 3 (moderate): Secondary | ICD-10-CM | POA: Diagnosis not present

## 2018-06-08 DIAGNOSIS — Z806 Family history of leukemia: Secondary | ICD-10-CM | POA: Insufficient documentation

## 2018-06-08 DIAGNOSIS — E1151 Type 2 diabetes mellitus with diabetic peripheral angiopathy without gangrene: Secondary | ICD-10-CM | POA: Insufficient documentation

## 2018-06-08 DIAGNOSIS — Z881 Allergy status to other antibiotic agents status: Secondary | ICD-10-CM | POA: Diagnosis not present

## 2018-06-08 DIAGNOSIS — E119 Type 2 diabetes mellitus without complications: Secondary | ICD-10-CM | POA: Diagnosis not present

## 2018-06-08 DIAGNOSIS — Z794 Long term (current) use of insulin: Secondary | ICD-10-CM | POA: Insufficient documentation

## 2018-06-08 DIAGNOSIS — R112 Nausea with vomiting, unspecified: Secondary | ICD-10-CM | POA: Diagnosis not present

## 2018-06-08 DIAGNOSIS — Z91041 Radiographic dye allergy status: Secondary | ICD-10-CM | POA: Insufficient documentation

## 2018-06-08 DIAGNOSIS — I5031 Acute diastolic (congestive) heart failure: Secondary | ICD-10-CM | POA: Insufficient documentation

## 2018-06-08 DIAGNOSIS — I4891 Unspecified atrial fibrillation: Secondary | ICD-10-CM | POA: Diagnosis not present

## 2018-06-08 DIAGNOSIS — Z803 Family history of malignant neoplasm of breast: Secondary | ICD-10-CM | POA: Insufficient documentation

## 2018-06-08 DIAGNOSIS — Z883 Allergy status to other anti-infective agents status: Secondary | ICD-10-CM | POA: Diagnosis not present

## 2018-06-08 DIAGNOSIS — I480 Paroxysmal atrial fibrillation: Secondary | ICD-10-CM | POA: Insufficient documentation

## 2018-06-08 DIAGNOSIS — G4733 Obstructive sleep apnea (adult) (pediatric): Secondary | ICD-10-CM | POA: Diagnosis not present

## 2018-06-08 DIAGNOSIS — I130 Hypertensive heart and chronic kidney disease with heart failure and stage 1 through stage 4 chronic kidney disease, or unspecified chronic kidney disease: Secondary | ICD-10-CM | POA: Diagnosis not present

## 2018-06-08 DIAGNOSIS — J45909 Unspecified asthma, uncomplicated: Secondary | ICD-10-CM | POA: Diagnosis not present

## 2018-06-08 DIAGNOSIS — Z7901 Long term (current) use of anticoagulants: Secondary | ICD-10-CM | POA: Insufficient documentation

## 2018-06-08 DIAGNOSIS — I4892 Unspecified atrial flutter: Secondary | ICD-10-CM | POA: Diagnosis not present

## 2018-06-08 DIAGNOSIS — Z79899 Other long term (current) drug therapy: Secondary | ICD-10-CM | POA: Insufficient documentation

## 2018-06-08 DIAGNOSIS — Z880 Allergy status to penicillin: Secondary | ICD-10-CM | POA: Diagnosis not present

## 2018-06-08 DIAGNOSIS — N189 Chronic kidney disease, unspecified: Secondary | ICD-10-CM | POA: Diagnosis not present

## 2018-06-08 DIAGNOSIS — Z89512 Acquired absence of left leg below knee: Secondary | ICD-10-CM | POA: Diagnosis not present

## 2018-06-08 DIAGNOSIS — Z885 Allergy status to narcotic agent status: Secondary | ICD-10-CM | POA: Insufficient documentation

## 2018-06-08 DIAGNOSIS — Z89511 Acquired absence of right leg below knee: Secondary | ICD-10-CM | POA: Diagnosis not present

## 2018-06-08 DIAGNOSIS — I4819 Other persistent atrial fibrillation: Secondary | ICD-10-CM | POA: Insufficient documentation

## 2018-06-08 DIAGNOSIS — Z87891 Personal history of nicotine dependence: Secondary | ICD-10-CM | POA: Diagnosis not present

## 2018-06-08 DIAGNOSIS — Z801 Family history of malignant neoplasm of trachea, bronchus and lung: Secondary | ICD-10-CM | POA: Diagnosis not present

## 2018-06-08 DIAGNOSIS — Z886 Allergy status to analgesic agent status: Secondary | ICD-10-CM | POA: Diagnosis not present

## 2018-06-08 DIAGNOSIS — Z8249 Family history of ischemic heart disease and other diseases of the circulatory system: Secondary | ICD-10-CM | POA: Insufficient documentation

## 2018-06-08 DIAGNOSIS — E785 Hyperlipidemia, unspecified: Secondary | ICD-10-CM | POA: Insufficient documentation

## 2018-06-08 DIAGNOSIS — R9431 Abnormal electrocardiogram [ECG] [EKG]: Secondary | ICD-10-CM | POA: Insufficient documentation

## 2018-06-08 DIAGNOSIS — I10 Essential (primary) hypertension: Secondary | ICD-10-CM | POA: Diagnosis not present

## 2018-06-08 DIAGNOSIS — M19049 Primary osteoarthritis, unspecified hand: Secondary | ICD-10-CM | POA: Insufficient documentation

## 2018-06-08 DIAGNOSIS — I2510 Atherosclerotic heart disease of native coronary artery without angina pectoris: Secondary | ICD-10-CM | POA: Diagnosis not present

## 2018-06-08 DIAGNOSIS — E0590 Thyrotoxicosis, unspecified without thyrotoxic crisis or storm: Secondary | ICD-10-CM | POA: Diagnosis not present

## 2018-06-08 DIAGNOSIS — Z888 Allergy status to other drugs, medicaments and biological substances status: Secondary | ICD-10-CM | POA: Insufficient documentation

## 2018-06-08 DIAGNOSIS — I252 Old myocardial infarction: Secondary | ICD-10-CM | POA: Diagnosis not present

## 2018-06-08 LAB — COMPREHENSIVE METABOLIC PANEL
ALT: 15 U/L (ref 0–44)
AST: 24 U/L (ref 15–41)
Albumin: 3.6 g/dL (ref 3.5–5.0)
Alkaline Phosphatase: 49 U/L (ref 38–126)
Anion gap: 14 (ref 5–15)
BUN: 25 mg/dL — ABNORMAL HIGH (ref 8–23)
CO2: 23 mmol/L (ref 22–32)
Calcium: 8.4 mg/dL — ABNORMAL LOW (ref 8.9–10.3)
Chloride: 95 mmol/L — ABNORMAL LOW (ref 98–111)
Creatinine, Ser: 1.64 mg/dL — ABNORMAL HIGH (ref 0.44–1.00)
GFR calc Af Amer: 38 mL/min — ABNORMAL LOW (ref >60)
GFR calc non Af Amer: 32 mL/min — ABNORMAL LOW (ref >60)
Glucose, Bld: 168 mg/dL — ABNORMAL HIGH (ref 70–99)
Potassium: 4.1 mmol/L (ref 3.5–5.1)
Sodium: 132 mmol/L — ABNORMAL LOW (ref 135–145)
Total Bilirubin: 0.6 mg/dL (ref 0.3–1.2)
Total Protein: 7.3 g/dL (ref 6.5–8.1)

## 2018-06-08 LAB — CBC
HEMATOCRIT: 36 % (ref 36.0–46.0)
HEMOGLOBIN: 11.3 g/dL — AB (ref 12.0–15.0)
MCH: 27.2 pg (ref 26.0–34.0)
MCHC: 31.4 g/dL (ref 30.0–36.0)
MCV: 86.7 fL (ref 80.0–100.0)
Platelets: 445 10*3/uL — ABNORMAL HIGH (ref 150–400)
RBC: 4.15 MIL/uL (ref 3.87–5.11)
RDW: 14.2 % (ref 11.5–15.5)
WBC: 5.9 10*3/uL (ref 4.0–10.5)
nRBC: 0 % (ref 0.0–0.2)

## 2018-06-08 LAB — MAGNESIUM: Magnesium: 0.6 mg/dL — CL (ref 1.7–2.4)

## 2018-06-08 MED ORDER — AMIODARONE HCL 200 MG PO TABS: 200.0000 mg | ORAL_TABLET | Freq: Every day | ORAL | 6 refills | Status: AC

## 2018-06-08 NOTE — Telephone Encounter (Signed)
I called and lm on vm to advise verbal ok for reduction in [facility] visits to once a week.

## 2018-06-08 NOTE — Patient Instructions (Signed)
Decrease amiodarone 200mg  once a day starting tomorrow

## 2018-06-08 NOTE — Telephone Encounter (Signed)
[facility]  ([place] orders   Hilda Blades [facility] would like to reduce frequency to once a week instead of twice a week

## 2018-06-08 NOTE — Addendum Note (Signed)
Encounter addended by: Oliver Barre, PA on: 06/08/2018 3:35 PM  Actions taken: Clinical Note Signed

## 2018-06-08 NOTE — Progress Notes (Addendum)
Primary Care Physician: Rutherford Guys, MD Referring Physician:[facility] f/u EP: Dr. Rayann Heman Cardiologist: Dr. Laurell Josephs is a 68 y.o. 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  She was admitted 04/14/18 with osteomyelitis of the Rt foot. Admission EKG showed atrial flutter with 2-1 conduction. She was seen by the Hospitalist service and placed on Diltiazem with improvement in her rate but she remained in atrial flutter (Cardiology was not consulted). On 04/15/18 she had Rt transtibial amputation. Post op course complicated by GI bleeding and anemia requiring transfusion. Endoscopy 04/16/18 showed esophagitis. She also had acute diastolic CHF and had to be diuresed. Eliquis was resumed, according to the records she remained in AF and was transferred to rehab. She was discharged 05/02/18.  She came to the ER  via private vehicle, reported to family not feeling well, nauseated, and needed to go to the ER, HPI was limited with some ongoing lethargy.  Reportedly en-route she had what appeared to her sister a seizure, upon arrival to the ER she was noted on telemetry to have AFib w/RVR, episodes of NSVT vs abbarency, cardiology consulted.  She had received amiodarone IV and continued  on po amiodarone.and metoprolol in the ER that blunted v rates, her sotalol stopped, though no appreciation of QT prolongation.  Dr. Harrell Gave worried these were VT given she had not historically had BBB with her rapid AF, and perhaps syncope (vs seizure).Dr. Lovena Le saw pt in consult and felt it was less likely to be v tach.   She had cardioversion 1/13 which was successful but unfortunately today is back in  afib with RVR, from which she is asymptomatic. She continues to load on amiodarone 200 mg bid. States  no missed doses of eliquis.   F/u afib clinic 06/08/18. She is in normal rhythm today. Will not pursue DCCV. Unfortunately, she has had some more nausea recently.   Today, she denies symptoms of palpitations, chest pain, shortness of breath, orthopnea, PND, lower extremity edema, dizziness, presyncope, syncope, or neurologic sequela. The patient is tolerating medications without difficulties and is otherwise without complaint today.   Past Medical History:  Diagnosis Date  . Abnormal EKG 07/31/2013  . Arthritis    "hands" (03/06/2015)  . Asthma   . Carpal tunnel syndrome, bilateral   . Chronic kidney disease   . Complication of anesthesia    slow to wake up. Pt sts she woke up during surgery many years ago.  . Coronary artery disease     2 v CAD with CTO of the RCA and high grade bifurcational LCx/OM stenosis. S/P PCI DES x 2 to the LCx/OM.  Marland Kitchen Diabetic peripheral neuropathy ([place]) "since 1996"  . GERD (gastroesophageal reflux disease)   . Goiter   . Headache    migraines prior to menopause  . History of shingles 06/01/2013  . Hyperlipidemia LDL goal <70 10/13/2015  . Hypertension   . Hyperthyroidism   . Osteomyelitis of foot (HCC)    Right  . PAF (paroxysmal atrial fibrillation) ([place]) 04/29/2015   CHADS2VASC score of 5 now on Apixaban  . Pneumonia ~ 1976  . Sleep apnea    Bipap  . Tremors of nervous system   . Type II diabetes mellitus (HCC)    insulin dependent  Past Surgical History:  Procedure Laterality Date  . ABDOMINAL HYSTERECTOMY  1988   age 19; CERVICAL DYSPLASIA; ovaries intact.   . AMPUTATION Right 01/23/2016   Procedure: Right 3rd Ray Amputation;  Surgeon: [name], MD;  Location: [facility];  Service: Orthopedics;  Laterality: Right;  . AMPUTATION Right 02/13/2016   Procedure: Right Transmetatarsal Amputation;  Surgeon: [name], MD;  Location: [facility];  Service: Orthopedics;  Laterality: Right;  . AMPUTATION Right 04/15/2018   Procedure: AMPUTATION BELOW KNEE;   Surgeon: [name], MD;  Location: [facility];  Service: Orthopedics;  Laterality: Right;  . BIOPSY  04/16/2018   Procedure: BIOPSY;  Surgeon: Irene Shipper, MD;  Location: [facility] ENDOSCOPY;  Service: Endoscopy;;  . CARDIAC CATHETERIZATION N/A 02/27/2015   Procedure: Left Heart Cath and Coronary Angiography;  Surgeon: [name], MD; LAD 40%, mCFX 80%, OM 70%, RCA 100% calcified       . CARDIAC CATHETERIZATION N/A 03/06/2015   Procedure: Coronary Stent Intervention;  Surgeon: [name], MD;  Location: [facility];  Service: Cardiovascular;  Laterality: N/A;  Mid CX 3.50x12 promus DES w/ 0% resdual and Prox OM1 2.50x20 promus DES w/ 20% residual  . CARDIOVERSION    . CARDIOVERSION N/A 05/22/2018   Procedure: CARDIOVERSION;  Surgeon: Skeet Latch, MD;  Location: [facility];  Service: Cardiovascular;  Laterality: N/A;  . CARPAL TUNNEL RELEASE Right Nov 2015  . CARPAL TUNNEL RELEASE Right 1992; 05/2014   [name]; [place]  . CESAREAN SECTION  1982; 1984  . ESOPHAGOGASTRODUODENOSCOPY (EGD) WITH PROPOFOL N/A 04/16/2018   Procedure: ESOPHAGOGASTRODUODENOSCOPY (EGD) WITH PROPOFOL;  Surgeon: Irene Shipper, MD;  Location: [facility] ENDOSCOPY;  Service: Endoscopy;  Laterality: N/A;  . FOOT NEUROMA SURGERY Bilateral 2000  . I&D EXTREMITY Left 01/06/2018   Procedure: DEBRIDEMENT ULCER LEFT FOOT;  Surgeon: [name], MD;  Location: [facility];  Service: Orthopedics;  Laterality: Left;  . KNEE ARTHROSCOPY Right ~ 2003   "meniscus repair"  . SHOULDER OPEN ROTATOR CUFF REPAIR Right 1996; 1998   "w/fracture repair"  . THYROID SURGERY  2000   "removed lots of nodules"  . TONSILLECTOMY  1976    Current Outpatient Medications  Medication Sig Dispense Refill  . acetaminophen (TYLENOL) 325 MG tablet Take 1-2 tablets (325-650 mg total) by mouth every 6 (six) hours as needed for mild pain (pain score 1-3 or temp > 100.5).    Marland Kitchen albuterol (PROVENTIL HFA) 108 (90 Base) MCG/ACT inhaler Inhale 1-2 puffs  into the lungs every 6 (six) hours as needed for wheezing or shortness of breath. 1 Inhaler 0  . amiodarone (PACERONE) 200 MG tablet Take 1 tablet (200 mg total) by mouth daily. 30 tablet 6  . apixaban (ELIQUIS) 5 MG TABS tablet Take 1 tablet (5 mg total) by mouth 2 (two) times daily. 180 tablet 3  . atorvastatin (LIPITOR) 20 MG tablet Take 1 tablet (20 mg total) by mouth daily. 90 tablet 3  . buPROPion (WELLBUTRIN SR) 150 MG 12 hr tablet Take 1 tablet (150 mg total) by mouth 2 (two) times daily. 60 tablet 1  . diltiazem (CARDIZEM CD) 120 MG 24 hr capsule Take 1 capsule (120 mg total) by mouth daily. 30 capsule 0  . fenofibrate 160 MG tablet Take 1 tablet (160 mg total) by mouth daily. 30 tablet 1  . FLUoxetine (PROZAC) 40 MG capsule Take 1 capsule (40 mg total) by mouth at bedtime. 90 capsule 1  . furosemide (LASIX)  20 MG tablet Take 3 tablets (60 mg total) by mouth daily. 273 tablet 3  . gabapentin (NEURONTIN) 300 MG capsule Take 3 capsules (900 mg total) by mouth 2 (two) times daily. TAKE 3 CAPSULES BY MOUTH EVERY  MORNING AND 3 CAPSULES AT BEDTIME 540 capsule 0  . glucose blood (TRUE METRIX BLOOD GLUCOSE TEST) test strip Used to check blood sugars 2x daily. 100 each 12  . HYDROcodone-acetaminophen (NORCO/VICODIN) 5-325 MG tablet Take 1-2 tablets by mouth every 4 (four) hours as needed for moderate pain (pain score 4-6). 30 tablet 0  . insulin degludec (TRESIBA FLEXTOUCH) 100 UNIT/ML SOPN FlexTouch Pen Inject 0.15 mLs (15 Units total) into the skin daily. 15 mL 0  . Magnesium 250 MG TABS Take 1 tablet by mouth daily.    . metFORMIN (GLUCOPHAGE) 1000 MG tablet Take 1 tablet (1,000 mg total) by mouth daily with breakfast. 30 tablet 11  . methimazole (TAPAZOLE) 10 MG tablet Take 1 tablet (10 mg total) by mouth daily. 30 tablet 11  . traMADol (ULTRAM) 50 MG tablet Take 50 mg by mouth every 6 (six) hours as needed.     No current facility-administered medications for this encounter.     Allergies    Allergen Reactions  . Contrast Media [Iodinated Diagnostic Agents] Hives and Other (See Comments)    Spoke to patient, Iodine allergy is really IV contrast allergy.   [name] [Insulin Aspart] Shortness Of Breath and Other (See Comments)    "breathing problems"  . Codeine Nausea And Vomiting and Other (See Comments)    HIGH DOSES-SEVERE VOMITING  . Iodine Other (See Comments)    MUST HAVE BENADRYL PRIOR TO PROCEDURE AND RIGHT BEFORE TREATMENT TO COUNTERACT REACTION-BLISTERING REACTION DERMATOLOGICAL  . Penicillins Itching, Rash and Other (See Comments)    Has patient had a PCN reaction causing immediate rash, facial/tongue/throat swelling, SOB or lightheadedness with hypotension: no Has patient had a PCN reaction causing severe rash involving mucus membranes or skin necrosis: No Has patient had a PCN reaction that required hospitalization No Has patient had a PCN reaction occurring within the last 10 years: No If all of the above answers are "NO", then may proceed with Cephalosporin use.  CHEST SIZED RASH AND ITCHING   . Ace Inhibitors Cough  . Demerol [Meperidine] Nausea And Vomiting  . Dilaudid [Hydromorphone Hcl] Other (See Comments)    HEADACHE   . Neosporin [Neomycin-Bacitracin Zn-Polymyx] Itching, Rash and Other (See Comments)    MAKES REACTIONS WORSE WHEN USING AS PROPHYLACTIC  . Percocet [Oxycodone-Acetaminophen] Rash  . Tape Itching and Rash    Social History   Socioeconomic History  . Marital status: Single    Spouse name: Not on file  . Number of children: 2  . Years of education: Masters  . Highest education level: Not on file  Occupational History  . Occupation: Landscape architect  Social Needs  . Financial resource strain: Somewhat hard  . Food insecurity:    Worry: Sometimes true    Inability: Sometimes true  . Transportation needs:    Medical: No    Non-medical: No  Tobacco Use  . Smoking status: Former Smoker    Packs/day: 0.00    Years: 41.00     Pack years: 0.00    Types: Cigarettes    Last attempt to quit: 03/05/2015    Years since quitting: 3.2  . Smokeless tobacco: Never Used  . Tobacco comment: 04/29/2015 "quit smoking cigarettes 02/27/2015"  Substance and Sexual  Activity  . Alcohol use: No  . Drug use: No  . Sexual activity: Not Currently    Birth control/protection: Post-menopausal, Surgical  Lifestyle  . Physical activity:    Days per week: 0 days    Minutes per session: 0 min  . Stress: Very much  Relationships  . Social connections:    Talks on phone: More than three times a week    Gets together: More than three times a week    Attends religious service: More than 4 times per year    Active member of club or organization: No    Attends meetings of clubs or organizations: Never    Relationship status: Never married  . Intimate partner violence:    Fear of current or ex partner: No    Emotionally abused: No    Physically abused: No    Forced sexual activity: No  Other Topics Concern  . 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      Lives with sister in [place].      Tobacco: 1 ppd x 41 years - quit 2016      Alcohol: none      Drugs: none      Exercise:  Walking in neighborhood; physical job.   Right-handed.   2 cups caffeine daily.    Family History  Problem Relation Age of Onset  . Cancer Mother 7       bronchial cancer  . Breast cancer Mother   . Lung cancer Mother   . Hypertension Father   . COPD Father   . Heart disease Father 30       CAD with cardiac stenting  . Heart attack Father   . Parkinson's disease Father   . Allergies Sister   . Breast cancer Maternal Grandmother   . Emphysema Maternal Grandfather   . Leukemia Paternal Grandmother   . Emphysema  Paternal Grandfather   . Thyroid disease Neg Hx     ROS- All systems are reviewed and negative except as per the HPI above  Physical Exam: Vitals:   06/08/18 1336  BP: (!) 142/84  Pulse: 79  Weight: 69.4 kg  Height: 5\' 4"  (1.626 m)   Wt Readings from Last 3 Encounters:  06/08/18 69.4 kg  06/07/18 69.4 kg  06/05/18 69.4 kg    Labs: Lab Results  Component Value Date   NA 137 05/25/2018   K 5.6 (H) 05/25/2018   CL 99 05/25/2018   CO2 22 05/25/2018   GLUCOSE 153 (H) 05/25/2018   BUN 22 05/25/2018   CREATININE 1.23 (H) 05/25/2018   CALCIUM 10.4 (H) 05/25/2018   PHOS 2.3 (L) 01/05/2018   MG 1.8 05/25/2018   Lab Results  Component Value Date   INR 1.38 04/14/2018   Lab Results  Component Value Date   CHOL 236 (H) 07/22/2017   HDL 30 (L) 07/22/2017   [place] Comment 07/22/2017   TRIG 544 (H) 07/22/2017    GEN- The patient is well appearing, alert and oriented x 3 today.   HEENT-head normocephalic, atraumatic, sclera clear, conjunctiva pink, hearing intact, trachea midline. Lungs- Clear to ausculation bilaterally, normal work of breathing Heart- Regular rate and rhythm, no murmurs, rubs or gallops  GI- soft,  NT, ND, + BS Extremities- BKA MS- no significant deformity or atrophy Skin- no rash or lesion Psych- euthymic mood, full affect Neuro- strength and sensation are intact   EKG- SR HR 79, LAD, slow R wave prog, PR 196, QRS 86, QTc 444 Epic records reviewed   Assessment and Plan: 1. Persistent  afib Failed sotalol. S/p DCCV 05/22/18 but had ERAF. Has been loaded on 200 mg BID of amiodarone. She is in SR today. Will decrease amiodarone to 200 mg daily given GI issues. Continue Eliquis 5 mg BID Continue diltiazem 120 mg daily Check CBC/Mag/Cmet  This patients CHA2DS2-VASc Score and unadjusted Ischemic Stroke Rate (% per year) is equal to 7.2 % stroke rate/year from a score of 5  Above score calculated as 1 point each if present [CHF, HTN, DM,  Vascular=MI/PAD/Aortic Plaque, Age if 65-74, or Female] Above score calculated as 2 points each if present [Age > 75, or Stroke/TIA/TE]   2. HTN Stable, no changes today.  3. CAD No anginal symptoms. Continue present therapy.   F/u w/ Ermalinda Barrios PA as scheduled Feb 12th.   Addendum: Patient magnesium 0.6mg /dL. We have called the patient and recommended that she go to the ER for IV replacement.   [facility] [address] [place], [place] [address] [phone]

## 2018-06-09 ENCOUNTER — Other Ambulatory Visit: Payer: Self-pay | Admitting: Physician Assistant

## 2018-06-09 ENCOUNTER — Encounter: Payer: Self-pay | Admitting: Podiatry

## 2018-06-09 NOTE — Telephone Encounter (Signed)
Please advise 

## 2018-06-09 NOTE — Telephone Encounter (Signed)
Spoke with patient She went to ER yesterday, got IV mag for mag level of 0.6mg  Oral mag increased to 400mg  BID I have asked to have mg rechecked in 3-4 days Has appt with me in 1 week

## 2018-06-10 NOTE — Telephone Encounter (Signed)
Please advise on refill. Pt has an appointment on next Friday with provider.   Thanks, [employer]

## 2018-06-10 NOTE — Telephone Encounter (Signed)
Please see message below

## 2018-06-11 DIAGNOSIS — I10 Essential (primary) hypertension: Secondary | ICD-10-CM | POA: Diagnosis not present

## 2018-06-11 DIAGNOSIS — J45909 Unspecified asthma, uncomplicated: Secondary | ICD-10-CM | POA: Diagnosis not present

## 2018-06-11 DIAGNOSIS — I252 Old myocardial infarction: Secondary | ICD-10-CM | POA: Diagnosis not present

## 2018-06-11 DIAGNOSIS — R112 Nausea with vomiting, unspecified: Secondary | ICD-10-CM | POA: Diagnosis not present

## 2018-06-11 DIAGNOSIS — I4891 Unspecified atrial fibrillation: Secondary | ICD-10-CM | POA: Diagnosis not present

## 2018-06-11 DIAGNOSIS — R569 Unspecified convulsions: Secondary | ICD-10-CM | POA: Diagnosis not present

## 2018-06-11 DIAGNOSIS — Z951 Presence of aortocoronary bypass graft: Secondary | ICD-10-CM | POA: Diagnosis not present

## 2018-06-11 DIAGNOSIS — E079 Disorder of thyroid, unspecified: Secondary | ICD-10-CM | POA: Diagnosis not present

## 2018-06-11 DIAGNOSIS — E119 Type 2 diabetes mellitus without complications: Secondary | ICD-10-CM | POA: Diagnosis not present

## 2018-06-12 DIAGNOSIS — E1151 Type 2 diabetes mellitus with diabetic peripheral angiopathy without gangrene: Secondary | ICD-10-CM | POA: Diagnosis not present

## 2018-06-12 DIAGNOSIS — N183 Chronic kidney disease, stage 3 (moderate): Secondary | ICD-10-CM | POA: Diagnosis not present

## 2018-06-12 DIAGNOSIS — M19042 Primary osteoarthritis, left hand: Secondary | ICD-10-CM | POA: Diagnosis not present

## 2018-06-12 DIAGNOSIS — Z4781 Encounter for orthopedic aftercare following surgical amputation: Secondary | ICD-10-CM | POA: Diagnosis not present

## 2018-06-12 DIAGNOSIS — I4819 Other persistent atrial fibrillation: Secondary | ICD-10-CM | POA: Diagnosis not present

## 2018-06-12 DIAGNOSIS — E1122 Type 2 diabetes mellitus with diabetic chronic kidney disease: Secondary | ICD-10-CM | POA: Diagnosis not present

## 2018-06-12 DIAGNOSIS — M19041 Primary osteoarthritis, right hand: Secondary | ICD-10-CM | POA: Diagnosis not present

## 2018-06-12 DIAGNOSIS — I130 Hypertensive heart and chronic kidney disease with heart failure and stage 1 through stage 4 chronic kidney disease, or unspecified chronic kidney disease: Secondary | ICD-10-CM | POA: Diagnosis not present

## 2018-06-12 DIAGNOSIS — I5030 Unspecified diastolic (congestive) heart failure: Secondary | ICD-10-CM | POA: Diagnosis not present

## 2018-06-13 DIAGNOSIS — E1151 Type 2 diabetes mellitus with diabetic peripheral angiopathy without gangrene: Secondary | ICD-10-CM | POA: Diagnosis not present

## 2018-06-13 DIAGNOSIS — Z4781 Encounter for orthopedic aftercare following surgical amputation: Secondary | ICD-10-CM | POA: Diagnosis not present

## 2018-06-13 DIAGNOSIS — M19041 Primary osteoarthritis, right hand: Secondary | ICD-10-CM | POA: Diagnosis not present

## 2018-06-13 DIAGNOSIS — N183 Chronic kidney disease, stage 3 (moderate): Secondary | ICD-10-CM | POA: Diagnosis not present

## 2018-06-13 DIAGNOSIS — I130 Hypertensive heart and chronic kidney disease with heart failure and stage 1 through stage 4 chronic kidney disease, or unspecified chronic kidney disease: Secondary | ICD-10-CM | POA: Diagnosis not present

## 2018-06-13 DIAGNOSIS — I4819 Other persistent atrial fibrillation: Secondary | ICD-10-CM | POA: Diagnosis not present

## 2018-06-13 DIAGNOSIS — I5030 Unspecified diastolic (congestive) heart failure: Secondary | ICD-10-CM | POA: Diagnosis not present

## 2018-06-13 DIAGNOSIS — E1122 Type 2 diabetes mellitus with diabetic chronic kidney disease: Secondary | ICD-10-CM | POA: Diagnosis not present

## 2018-06-13 DIAGNOSIS — M19042 Primary osteoarthritis, left hand: Secondary | ICD-10-CM | POA: Diagnosis not present

## 2018-06-14 ENCOUNTER — Ambulatory Visit: Payer: Medicare PPO

## 2018-06-15 ENCOUNTER — Telehealth: Payer: Self-pay

## 2018-06-15 ENCOUNTER — Telehealth (INDEPENDENT_AMBULATORY_CARE_PROVIDER_SITE_OTHER): Payer: Self-pay | Admitting: Orthopedic Surgery

## 2018-06-15 DIAGNOSIS — E119 Type 2 diabetes mellitus without complications: Secondary | ICD-10-CM | POA: Diagnosis not present

## 2018-06-15 DIAGNOSIS — R9431 Abnormal electrocardiogram [ECG] [EKG]: Secondary | ICD-10-CM | POA: Diagnosis not present

## 2018-06-15 DIAGNOSIS — I10 Essential (primary) hypertension: Secondary | ICD-10-CM | POA: Diagnosis not present

## 2018-06-15 DIAGNOSIS — R110 Nausea: Secondary | ICD-10-CM | POA: Diagnosis not present

## 2018-06-15 DIAGNOSIS — Z91041 Radiographic dye allergy status: Secondary | ICD-10-CM | POA: Diagnosis not present

## 2018-06-15 DIAGNOSIS — R790 Abnormal level of blood mineral: Secondary | ICD-10-CM | POA: Diagnosis not present

## 2018-06-15 DIAGNOSIS — I4891 Unspecified atrial fibrillation: Secondary | ICD-10-CM | POA: Diagnosis not present

## 2018-06-15 DIAGNOSIS — I252 Old myocardial infarction: Secondary | ICD-10-CM | POA: Diagnosis not present

## 2018-06-15 DIAGNOSIS — Z885 Allergy status to narcotic agent status: Secondary | ICD-10-CM | POA: Diagnosis not present

## 2018-06-15 DIAGNOSIS — E079 Disorder of thyroid, unspecified: Secondary | ICD-10-CM | POA: Diagnosis not present

## 2018-06-15 DIAGNOSIS — I4892 Unspecified atrial flutter: Secondary | ICD-10-CM | POA: Diagnosis not present

## 2018-06-15 LAB — MAGNESIUM: Magnesium: 0.9 mg/dL — CL (ref 1.6–2.3)

## 2018-06-15 NOTE — Telephone Encounter (Signed)
Just FYI.

## 2018-06-15 NOTE — Telephone Encounter (Signed)
We have received a critical lab value of Magnesium of 0.9 and I have called the pt to let her know. She stated that she was under the impression that we do those infusions here. I informed her that we do not and I will see what I can do.   Pt did say that she got a nephrologist appointment on 08/27/2018. She stated it was so far away. I have informed her to keep the appointment and then she can look for another before canceling for they just might be booked out that far in advance.   After several attempts to get her in for an infusion there was no success. With the recommendation of the provider she is to go to back to the [facility] where she has just received a transfusion last Sunday.   Pt stated understanding and will go today.

## 2018-06-15 NOTE — Telephone Encounter (Signed)
Thank you :)

## 2018-06-15 NOTE — Telephone Encounter (Signed)
Debra[facility] called has low magnesium-chronic for patient-sent to hospital-did an magnesium infusion  For 1 hour-top layer of skin is gone. Not open-Just wanted to make you all aware.  No need to call she has to let you know status of patient

## 2018-06-16 ENCOUNTER — Encounter: Payer: Self-pay | Admitting: Family Medicine

## 2018-06-16 ENCOUNTER — Ambulatory Visit (INDEPENDENT_AMBULATORY_CARE_PROVIDER_SITE_OTHER): Payer: Medicare PPO | Admitting: Physician Assistant

## 2018-06-16 ENCOUNTER — Telehealth: Payer: Self-pay | Admitting: Family Medicine

## 2018-06-16 ENCOUNTER — Other Ambulatory Visit: Payer: Self-pay

## 2018-06-16 ENCOUNTER — Ambulatory Visit (INDEPENDENT_AMBULATORY_CARE_PROVIDER_SITE_OTHER): Payer: Medicare PPO | Admitting: Family Medicine

## 2018-06-16 ENCOUNTER — Encounter (INDEPENDENT_AMBULATORY_CARE_PROVIDER_SITE_OTHER): Payer: Self-pay | Admitting: Physician Assistant

## 2018-06-16 VITALS — Ht 64.0 in | Wt 153.0 lb

## 2018-06-16 DIAGNOSIS — E1142 Type 2 diabetes mellitus with diabetic polyneuropathy: Secondary | ICD-10-CM

## 2018-06-16 DIAGNOSIS — Z89511 Acquired absence of right leg below knee: Secondary | ICD-10-CM

## 2018-06-16 DIAGNOSIS — S50812A Abrasion of left forearm, initial encounter: Secondary | ICD-10-CM | POA: Diagnosis not present

## 2018-06-16 DIAGNOSIS — M7541 Impingement syndrome of right shoulder: Secondary | ICD-10-CM

## 2018-06-16 DIAGNOSIS — N183 Chronic kidney disease, stage 3 unspecified: Secondary | ICD-10-CM

## 2018-06-16 DIAGNOSIS — F418 Other specified anxiety disorders: Secondary | ICD-10-CM

## 2018-06-16 DIAGNOSIS — T148XXA Other injury of unspecified body region, initial encounter: Secondary | ICD-10-CM

## 2018-06-16 DIAGNOSIS — Z794 Long term (current) use of insulin: Secondary | ICD-10-CM

## 2018-06-16 MED ORDER — FUROSEMIDE 20 MG PO TABS: 20.0000 mg | ORAL_TABLET | Freq: Every day | ORAL | 0 refills | Status: DC

## 2018-06-16 MED ORDER — GENERIC EXTERNAL MEDICATION
10.00 | Status: DC
Start: ? — End: 2018-06-16

## 2018-06-16 MED ORDER — SODIUM CHLORIDE 0.9 % IV SOLN
10.00 | INTRAVENOUS | Status: DC
Start: ? — End: 2018-06-16

## 2018-06-16 MED ORDER — MUPIROCIN 2 % EX OINT: 1.0000 "application " | TOPICAL_OINTMENT | Freq: Three times a day (TID) | CUTANEOUS | 1 refills | Status: DC

## 2018-06-16 NOTE — Patient Instructions (Signed)
° ° ° °  If you have lab work done today you will be contacted with your lab results within the next 2 weeks.  If you have not heard from us then please contact us. The fastest way to get your results is to register for My Chart. ° ° °IF you received an x-ray today, you will receive an invoice from [facility]. Please contact [facility] at [phone] with questions or concerns regarding your invoice.  ° °IF you received labwork today, you will receive an invoice from [facility]. Please contact [facility] at [phone] with questions or concerns regarding your invoice.  ° °Our billing staff will not be able to assist you with questions regarding bills from these companies. ° °You will be contacted with the lab results as soon as they are available. The fastest way to get your results is to activate your My Chart account. Instructions are located on the last page of this paperwork. If you have not heard from us regarding the results in 2 weeks, please contact this office. °  ° ° ° °

## 2018-06-16 NOTE — Telephone Encounter (Signed)
Copied from [place]. Topic: General - Other >> Jun 16, 2018 12:42 PM [name] wrote: Reason for CRM: Langley Gauss with [facility] called in to make provider [place] aware that pt was seen there yesterday. They set pt up with a Nephrologist apt with Dr. Loura Back. She stated that pt's magnesium was low so pt could need possible transfusion.  Pt is scheduled to be seen today. Please make provider aware.

## 2018-06-16 NOTE — Progress Notes (Signed)
2/7/20204:58 PM  Judith [name] 1950/08/21, 68 y.o. female [id number]  Chief Complaint  Patient presents with  . Follow-up    Hospital ed follow up . Did 2 bags of magnesium infusion. [facility] has the infusion center inside their facility in [place]    HPI:   Patient is a 68 y.o. female with past medical history significant for DM2,afib,  CAD, peripheral neuropathy, and right foot amputation, who presents today for ER followup  Went to ER for magnesium infusion x 2 Mg 0.6 and Mg 0.9 On magnesium 400mg  BID  [facility] clinic Patient wanting to go there from now on  [facility] referred her to nephrology  Has upcoming appt with behavioral health for counseling She reports mood is much better since increase in prozac  cbgs 161 was been the highest Metformin decreased to once a day No bolus insulin Cont tresiba  Patient's sister reports only 4 weeks of protonix per GI This will be her last week  I reached out to her cardiologist given cont low mag and high dose lasix Per cards can decrease lasix to 20mg  daily Sees them on the 12th of this month  Has abrasion on left forearm from tape being pulled and tearing her skin  Continues with home health BKA stump, right, healing well Rotator cuff surgery pending neuro and cards clearance  Overall doing better  Fall Risk  06/16/2018 05/25/2018 04/07/2018 02/06/2018 12/31/2017  Falls in the past year? 0 0 0 Yes No  Number falls in past yr: 0 - - 2 or more -  Injury with Fall? 0 - - No -  Comment - - - - -  Risk Factor Category  - - - - -  Risk for fall due to : - - - - -  Risk for fall due to: Comment - - - - -  Follow up - - - - -     Depression screen [facility] 2/9 06/16/2018 05/25/2018 04/07/2018  Decreased Interest 0 0 0  Down, Depressed, Hopeless 0 0 0  PHQ - 2 Score 0 0 0  Some recent data might be hidden    Allergies  Allergen Reactions  . Contrast Media [Iodinated Diagnostic  Agents] Hives and Other (See Comments)    Spoke to patient, Iodine allergy is really IV contrast allergy.   [name] [Insulin Aspart] Shortness Of Breath and Other (See Comments)    "breathing problems"  . Codeine Nausea And Vomiting and Other (See Comments)    HIGH DOSES-SEVERE VOMITING  . Iodine Other (See Comments)    MUST HAVE BENADRYL PRIOR TO PROCEDURE AND RIGHT BEFORE TREATMENT TO COUNTERACT REACTION-BLISTERING REACTION DERMATOLOGICAL  . Penicillins Itching, Rash and Other (See Comments)    Has patient had a PCN reaction causing immediate rash, facial/tongue/throat swelling, SOB or lightheadedness with hypotension: no Has patient had a PCN reaction causing severe rash involving mucus membranes or skin necrosis: No Has patient had a PCN reaction that required hospitalization No Has patient had a PCN reaction occurring within the last 10 years: No If all of the above answers are "NO", then may proceed with Cephalosporin use.  CHEST SIZED RASH AND ITCHING   . Ace Inhibitors Cough  . Demerol [Meperidine] Nausea And Vomiting  . Dilaudid [Hydromorphone Hcl] Other (See Comments)    HEADACHE   . Neosporin [Neomycin-Bacitracin Zn-Polymyx] Itching, Rash and Other (See Comments)    MAKES REACTIONS WORSE WHEN USING AS PROPHYLACTIC  . Percocet [  Oxycodone-Acetaminophen] Rash  . Tape Itching and Rash    Prior to Admission medications   Medication Sig Start Date End Date Taking? Authorizing Provider  acetaminophen (TYLENOL) 325 MG tablet Take 1-2 tablets (325-650 mg total) by mouth every 6 (six) hours as needed for mild pain (pain score 1-3 or temp > 100.5). 05/01/18  Yes Angiulli, Lavon Paganini, PA-C  albuterol (PROVENTIL HFA) 108 (90 Base) MCG/ACT inhaler Inhale 1-2 puffs into the lungs every 6 (six) hours as needed for wheezing or shortness of breath. 05/01/18  Yes Angiulli, Lavon Paganini, PA-C  amiodarone (PACERONE) 200 MG tablet Take 1 tablet (200 mg total) by mouth daily. 06/08/18  Yes Sherran Needs, NP  apixaban (ELIQUIS) 5 MG TABS tablet Take 1 tablet (5 mg total) by mouth 2 (two) times daily. 05/01/18  Yes Angiulli, Lavon Paganini, PA-C  atorvastatin (LIPITOR) 20 MG tablet Take 1 tablet (20 mg total) by mouth daily. 05/01/18  Yes Angiulli, Lavon Paganini, PA-C  buPROPion [facility] SR) 150 MG 12 hr tablet Take 1 tablet (150 mg total) by mouth 2 (two) times daily. 05/01/18  Yes Angiulli, Lavon Paganini, PA-C  diltiazem (CARDIZEM CD) 120 MG 24 hr capsule Take 1 capsule (120 mg total) by mouth daily. 05/24/18  Yes Patrecia Pour, MD  fenofibrate 160 MG tablet Take 1 tablet (160 mg total) by mouth daily. 05/02/18  Yes Angiulli, Lavon Paganini, PA-C  FLUoxetine (PROZAC) 40 MG capsule Take 1 capsule (40 mg total) by mouth at bedtime. 05/25/18  Yes Rutherford Guys, MD  furosemide (LASIX) 20 MG tablet Take 3 tablets (60 mg total) by mouth daily. 05/01/18 05/01/19 Yes Angiulli, Lavon Paganini, PA-C  gabapentin (NEURONTIN) 300 MG capsule TAKE 3 CAPSULES BY MOUTH IN THE MORNING AND TAKE 3 CAPSULES BY MOUTH AT BEDTIME 06/11/18  Yes Rutherford Guys, MD  glucose blood (TRUE METRIX BLOOD GLUCOSE TEST) test strip Used to check blood sugars 2x daily. 08/25/17  Yes Renato Shin, MD  HYDROcodone-acetaminophen (NORCO/VICODIN) 5-325 MG tablet Take 1-2 tablets by mouth every 4 (four) hours as needed for moderate pain (pain score 4-6). 05/01/18  Yes Angiulli, Lavon Paganini, PA-C  insulin degludec (TRESIBA FLEXTOUCH) 100 UNIT/ML SOPN FlexTouch Pen Inject 0.15 mLs (15 Units total) into the skin daily. 04/03/18  Yes Renato Shin, MD  Magnesium 250 MG TABS Take 1 tablet by mouth daily.   Yes [provider]  metFORMIN (GLUCOPHAGE) 1000 MG tablet Take 1 tablet (1,000 mg total) by mouth daily with breakfast. 06/07/18  Yes Renato Shin, MD  methimazole (TAPAZOLE) 10 MG tablet Take 1 tablet (10 mg total) by mouth daily. 05/01/18  Yes Angiulli, Lavon Paganini, PA-C  traMADol (ULTRAM) 50 MG tablet Take 50 mg by mouth every 6 (six) hours as needed.    Yes [provider]    Past Medical History:  Diagnosis Date  . Abnormal EKG 07/31/2013  . Arthritis    "hands" (03/06/2015)  . Asthma   . Carpal tunnel syndrome, bilateral   . Chronic kidney disease   . Complication of anesthesia    slow to wake up. Pt sts she woke up during surgery many years ago.  . Coronary artery disease     2 v CAD with CTO of the RCA and high grade bifurcational LCx/OM stenosis. S/P PCI DES x 2 to the LCx/OM.  Marland Kitchen Diabetic peripheral neuropathy ([place]) "since 1996"  . GERD (gastroesophageal reflux disease)   . Goiter   . Headache    migraines prior  to menopause  . History of shingles 06/01/2013  . Hyperlipidemia LDL goal <70 10/13/2015  . Hypertension   . Hyperthyroidism   . Osteomyelitis of foot (HCC)    Right  . PAF (paroxysmal atrial fibrillation) ([place]) 04/29/2015   CHADS2VASC score of 5 now on Apixaban  . Pneumonia ~ 1976  . Sleep apnea    Bipap  . Tremors of nervous system   . Type II diabetes mellitus (HCC)    insulin dependent    Past Surgical History:  Procedure Laterality Date  . ABDOMINAL HYSTERECTOMY  1988   age 39; CERVICAL DYSPLASIA; ovaries intact.   . AMPUTATION Right 01/23/2016   Procedure: Right 3rd Ray Amputation;  Surgeon: [name], MD;  Location: [place];  Service: Orthopedics;  Laterality: Right;  . AMPUTATION Right 02/13/2016   Procedure: Right Transmetatarsal Amputation;  Surgeon: [name], MD;  Location: [place];  Service: Orthopedics;  Laterality: Right;  . AMPUTATION Right 04/15/2018   Procedure: AMPUTATION BELOW KNEE;  Surgeon: [name], MD;  Location: [place];  Service: Orthopedics;  Laterality: Right;  . BIOPSY  04/16/2018   Procedure: BIOPSY;  Surgeon: Irene Shipper, MD;  Location: [facility] ENDOSCOPY;  Service: Endoscopy;;  . CARDIAC CATHETERIZATION N/A 02/27/2015   Procedure: Left Heart Cath and Coronary Angiography;  Surgeon: [name], MD; LAD 40%, mCFX 80%, OM 70%, RCA 100% calcified       . CARDIAC  CATHETERIZATION N/A 03/06/2015   Procedure: Coronary Stent Intervention;  Surgeon: [name], MD;  Location: [place] CV LAB;  Service: Cardiovascular;  Laterality: N/A;  Mid CX 3.50x12 promus DES w/ 0% resdual and Prox OM1 2.50x20 promus DES w/ 20% residual  . CARDIOVERSION    . CARDIOVERSION N/A 05/22/2018   Procedure: CARDIOVERSION;  Surgeon: Skeet Latch, MD;  Location: [place];  Service: Cardiovascular;  Laterality: N/A;  . CARPAL TUNNEL RELEASE Right Nov 2015  . CARPAL TUNNEL RELEASE Right 1992; 05/2014   [name]; [place]  . CESAREAN SECTION  1982; 1984  . ESOPHAGOGASTRODUODENOSCOPY (EGD) WITH PROPOFOL N/A 04/16/2018   Procedure: ESOPHAGOGASTRODUODENOSCOPY (EGD) WITH PROPOFOL;  Surgeon: Irene Shipper, MD;  Location: [facility] ENDOSCOPY;  Service: Endoscopy;  Laterality: N/A;  . FOOT NEUROMA SURGERY Bilateral 2000  . I&D EXTREMITY Left 01/06/2018   Procedure: DEBRIDEMENT ULCER LEFT FOOT;  Surgeon: [name], MD;  Location: [place];  Service: Orthopedics;  Laterality: Left;  . KNEE ARTHROSCOPY Right ~ 2003   "meniscus repair"  . SHOULDER OPEN ROTATOR CUFF REPAIR Right 1996; 1998   "w/fracture repair"  . THYROID SURGERY  2000   "removed lots of nodules"  . TONSILLECTOMY  1976    Social History   Tobacco Use  . Smoking status: Former Smoker    Packs/day: 0.00    Years: 41.00    Pack years: 0.00    Types: Cigarettes    Last attempt to quit: 03/05/2015    Years since quitting: 3.2  . Smokeless tobacco: Never Used  . Tobacco comment: 04/29/2015 "quit smoking cigarettes 02/27/2015"  Substance Use Topics  . Alcohol use: No    Family History  Problem Relation Age of Onset  . Cancer Mother 65       bronchial cancer  . Breast cancer Mother   . Lung cancer Mother   . Hypertension Father   . COPD Father   . Heart disease Father 7       CAD with cardiac stenting  . Heart attack  Father   . Parkinson's disease Father   . Allergies Sister   . Breast cancer  Maternal Grandmother   . Emphysema Maternal Grandfather   . Leukemia Paternal Grandmother   . Emphysema Paternal Grandfather   . Thyroid disease Neg Hx     Review of Systems  Constitutional: Negative for chills and fever.  Respiratory: Negative for cough and shortness of breath.   Cardiovascular: Negative for chest pain, palpitations and leg swelling.  Gastrointestinal: Negative for abdominal pain, nausea and vomiting.     OBJECTIVE:  Blood pressure 131/74, pulse 84, temperature 98.7 F (37.1 C), temperature source Oral, height 5\' 4"  (1.626 m), weight 152 lb (68.9 kg), SpO2 96 %. Body mass index is 26.09 kg/m.   Physical Exam Vitals signs and nursing note reviewed.  Constitutional:      Appearance: She is well-developed.  HENT:     Head: Normocephalic and atraumatic.     Mouth/Throat:     Pharynx: No oropharyngeal exudate.  Eyes:     General: No scleral icterus.    Conjunctiva/sclera: Conjunctivae normal.     Pupils: Pupils are equal, round, and reactive to light.  Neck:     Musculoskeletal: Neck supple.  Cardiovascular:     Rate and Rhythm: Normal rate and regular rhythm.     Heart sounds: Murmur present. No friction rub. No gallop.   Pulmonary:     Effort: Pulmonary effort is normal.     Breath sounds: Normal breath sounds. No wheezing or rales.  Skin:    General: Skin is warm and dry.  Neurological:     Mental Status: She is alert and oriented to person, place, and time.        ASSESSMENT and PLAN  1. Hypomagnesemia Most likely from recent addition of lasix and PPI. Lasix being decreased, reviewed RTC precautions, fu with cards next week. Per patient PPI was only x 4 weeks, I have reached out to her GI doctor to confirm. Due to convenience, patient will have weekly mag and infusions as needed via [facility] infusion at [place]. Recommend keep renal appt for complete eval.   2. Depression with anxiety Improved. Cont current dose of prozac, keep upcoming  appt at behavioral health  3. Abrasion rx for bactroban. Discussed wound care and RTC precautions.   Other orders - furosemide (LASIX) 20 MG tablet; Take 1 tablet (20 mg total) by mouth daily. - mupirocin ointment (BACTROBAN) 2 %; Apply 1 application topically 3 (three) times daily.  Return in about 4 weeks (around 07/14/2018).    Rutherford Guys, MD Primary Care at [facility], [place] [address] Ph.  [phone] Fax [phone]

## 2018-06-16 NOTE — Progress Notes (Signed)
Office Visit Note   Patient: Judith [name]           Date of Birth: 1951-03-05           MRN: [id number] Visit Date: 06/16/2018              Requested by: Rutherford Guys, MD [address] [place], [place] [address] PCP: Rutherford Guys, MD  Chief Complaint  Patient presents with  . Right Leg - Routine Post Op    BKA 04/15/18      HPI: The patient is a 68 year old woman here with her sister for postoperative follow-up following a right transtibial amputation on 04/15/2018.  She has been working with biotech clinic for supplies, stump shrinker stockings and has not yet been measured for prosthesis.  She continues to have difficulties with hypomagnesemia felt to be related to kidney disease.  She has been following up in the clinic with [facility] for IV magnesium infusions.  She has seen cardiology and they have okayed surgery to her right shoulder but she has to see neurology as well for clearance prior to proceeding with any surgery on the right shoulder.  She is very pleased with how her below the knee amputation site is doing and this is not causing her any issues but her shoulder is causing her significant pain and dysfunction.  Assessment & Plan: Visit Diagnoses:  1. Acquired absence of right leg below knee ([place])   2. Type 2 diabetes mellitus with diabetic polyneuropathy, with long-term current use of insulin ([place])   3. CKD (chronic kidney disease), stage III ([place])   4. Impingement syndrome of right shoulder     Plan: Continue to work with biotech clinic for stump shrinker stockings and eventually for her prosthetic.  She is going to follow-up with neurology and once she has been cleared she is going to call the office to arrange for her right shoulder arthroscopic debridement which had previously been canceled due to her need for emergent right transtibial amputation.  She can continue to utilize lotion to the right transtibial amputation site and continue her stump shrinker  stockings.  Follow-Up Instructions: Return in about 6 weeks (around 07/28/2018).   Ortho Exam  Patient is alert, oriented, no adenopathy, well-dressed, normal affect, normal respiratory effort. Right transtibial amputation site is well-healed.  She has some minimal scabbing over the incisional line but no signs of cellulitis or infection.  She has full knee extension and excellent flexion.  There is good consolidation with minimal residual edema of the distal residual limb.    Imaging: No results found. No images are attached to the encounter.  Labs: Lab Results  Component Value Date   HGBA1C 7.5 (A) 06/07/2018   HGBA1C 8.0 (H) 04/14/2018   HGBA1C 7.9 (A) 04/03/2018   ESRSEDRATE 133 (H) 01/02/2018   CRP 23.5 (H) 01/02/2018   REPTSTATUS 05/20/2018 FINAL 05/18/2018   GRAMSTAIN  01/06/2018    MODERATE WBC PRESENT, PREDOMINANTLY PMN RARE GRAM POSITIVE COCCI    CULT 20,000 COLONIES/mL SERRATIA MARCESCENS (A) 05/18/2018   LABORGA SERRATIA MARCESCENS (A) 05/18/2018     Lab Results  Component Value Date   ALBUMIN 3.6 06/08/2018   ALBUMIN 4.2 05/25/2018   ALBUMIN 3.6 05/14/2018    Body mass index is 26.26 kg/m.  Orders:  No orders of the defined types were placed in this encounter.  No orders of the defined types were placed in this encounter.    Procedures: No procedures performed  Clinical Data: No additional findings.  ROS:  All other systems negative, except as noted in the HPI. Review of Systems  Objective: Vital Signs: Ht 5\' 4"  (1.626 m)   Wt 153 lb (69.4 kg)   BMI 26.26 kg/m   Specialty Comments:  No specialty comments available.  PMFS History: Patient Active Problem List   Diagnosis Date Noted  . V-tach ([place]) 05/14/2018  . Seizure ([place]) 05/14/2018  . Slow transit constipation   . Fall   . Labile blood glucose   . Labile blood pressure   . Chronic combined systolic and diastolic congestive heart failure ([place])   . Chronic diastolic congestive  heart failure ([place])   . CKD (chronic kidney disease), stage III ([place])   . Type 2 diabetes mellitus with peripheral neuropathy (HCC)   . Amputation of right lower extremity below knee ([place]) 04/20/2018  . Unilateral traumatic amputation of right leg below knee with complication, initial encounter ([place])   . Post-operative pain   . PAF (paroxysmal atrial fibrillation) ([place])   . Duodenal ulcer   . Atrial fibrillation with RVR ([place]) 04/14/2018  . Foot osteomyelitis, right ([place]) 04/14/2018  . Cutaneous abscess of left foot   . Acute respiratory failure with hypoxia ([place])   . Cellulitis 01/02/2018  . LGI bleed   . Acute blood loss anemia   . Wide-complex tachycardia ([place]) 07/04/2017  . Type II diabetes mellitus, uncontrolled ([place]) 07/04/2017  . Peripheral neuropathy 07/04/2017  . H/O hyperthyroidism 07/04/2017  . Atrial fibrillation with rapid ventricular response ([place]) 08/06/2016  . S/P transmetatarsal amputation of foot, right ([place]) 04/19/2016  . Obstructive sleep apnea 11/26/2015  . Bilateral carpal tunnel syndrome 11/26/2015  . Hypomagnesemia 11/16/2015  . Hyperlipidemia LDL goal <70 10/13/2015  . Abnormality of gait 09/02/2015  . Memory loss 08/12/2015  . Diabetic peripheral neuropathy ([place]) 08/12/2015  . Vitamin D deficiency 08/12/2015  . Hyperthyroidism 04/30/2015  . Heme positive stool   . Persistent atrial fibrillation 04/29/2015  . Coronary artery disease with stable angina pectoris ([place]) 03/29/2015  . Abnormal nuclear stress test   . History of goiter 09/28/2014  . GERD (gastroesophageal reflux disease) 07/31/2013  . Depression with anxiety 06/01/2013  . DM (diabetes mellitus), type 2 with peripheral vascular complications ([place]) 82/42/3536  . HTN (hypertension) 01/30/2013   Past Medical History:  Diagnosis Date  . Abnormal EKG 07/31/2013  . Arthritis    "hands" (03/06/2015)  . Asthma   . Carpal tunnel syndrome, bilateral   . Chronic kidney disease   . Complication of  anesthesia    slow to wake up. Pt sts she woke up during surgery many years ago.  . Coronary artery disease     2 v CAD with CTO of the RCA and high grade bifurcational LCx/OM stenosis. S/P PCI DES x 2 to the LCx/OM.  Marland Kitchen Diabetic peripheral neuropathy ([place]) "since 1996"  . GERD (gastroesophageal reflux disease)   . Goiter   . Headache    migraines prior to menopause  . History of shingles 06/01/2013  . Hyperlipidemia LDL goal <70 10/13/2015  . Hypertension   . Hyperthyroidism   . Osteomyelitis of foot (HCC)    Right  . PAF (paroxysmal atrial fibrillation) ([place]) 04/29/2015   CHADS2VASC score of 5 now on Apixaban  . Pneumonia ~ 1976  . Sleep apnea    Bipap  . Tremors of nervous system   . Type II diabetes mellitus (HCC)    insulin dependent    Family  History  Problem Relation Age of Onset  . Cancer Mother 65       bronchial cancer  . Breast cancer Mother   . Lung cancer Mother   . Hypertension Father   . COPD Father   . Heart disease Father 33       CAD with cardiac stenting  . Heart attack Father   . Parkinson's disease Father   . Allergies Sister   . Breast cancer Maternal Grandmother   . Emphysema Maternal Grandfather   . Leukemia Paternal Grandmother   . Emphysema Paternal Grandfather   . Thyroid disease Neg Hx     Past Surgical History:  Procedure Laterality Date  . ABDOMINAL HYSTERECTOMY  1988   age 70; CERVICAL DYSPLASIA; ovaries intact.   . AMPUTATION Right 01/23/2016   Procedure: Right 3rd Ray Amputation;  Surgeon: [name], MD;  Location: [place];  Service: Orthopedics;  Laterality: Right;  . AMPUTATION Right 02/13/2016   Procedure: Right Transmetatarsal Amputation;  Surgeon: [name], MD;  Location: [place];  Service: Orthopedics;  Laterality: Right;  . AMPUTATION Right 04/15/2018   Procedure: AMPUTATION BELOW KNEE;  Surgeon: [name], MD;  Location: [place];  Service: Orthopedics;  Laterality: Right;  . BIOPSY  04/16/2018   Procedure: BIOPSY;   Surgeon: Irene Shipper, MD;  Location[facility] ENDOSCOPY;  Service: Endoscopy;;  . CARDIAC CATHETERIZATION N/A 02/27/2015   Procedure: Left Heart Cath and Coronary Angiography;  Surgeon: [name], MD; LAD 40%, mCFX 80%, OM 70%, RCA 100% calcified       . CARDIAC CATHETERIZATION N/A 03/06/2015   Procedure: Coronary Stent Intervention;  Surgeon: [name], MD;  Location: [place] CV LAB;  Service: Cardiovascular;  Laterality: N/A;  Mid CX 3.50x12 promus DES w/ 0% resdual and Prox OM1 2.50x20 promus DES w/ 20% residual  . CARDIOVERSION    . CARDIOVERSION N/A 05/22/2018   Procedure: CARDIOVERSION;  Surgeon: Skeet Latch, MD;  Location: [place];  Service: Cardiovascular;  Laterality: N/A;  . CARPAL TUNNEL RELEASE Right Nov 2015  . CARPAL TUNNEL RELEASE Right 1992; 05/2014   [name]; [place]  . CESAREAN SECTION  1982; 1984  . ESOPHAGOGASTRODUODENOSCOPY (EGD) WITH PROPOFOL N/A 04/16/2018   Procedure: ESOPHAGOGASTRODUODENOSCOPY (EGD) WITH PROPOFOL;  Surgeon: Irene Shipper, MD;  Location: [facility] ENDOSCOPY;  Service: Endoscopy;  Laterality: N/A;  . FOOT NEUROMA SURGERY Bilateral 2000  . I&D EXTREMITY Left 01/06/2018   Procedure: DEBRIDEMENT ULCER LEFT FOOT;  Surgeon: [name], MD;  Location: [place];  Service: Orthopedics;  Laterality: Left;  . KNEE ARTHROSCOPY Right ~ 2003   "meniscus repair"  . SHOULDER OPEN ROTATOR CUFF REPAIR Right 1996; 1998   "w/fracture repair"  . THYROID SURGERY  2000   "removed lots of nodules"  . TONSILLECTOMY  1976   Social History   Occupational History  . Occupation: Landscape architect  Tobacco Use  . Smoking status: Former Smoker    Packs/day: 0.00    Years: 41.00    Pack years: 0.00    Types: Cigarettes    Last attempt to quit: 03/05/2015    Years since quitting: 3.2  . Smokeless tobacco: Never Used  . Tobacco comment: 04/29/2015 "quit smoking cigarettes 02/27/2015"  Substance and Sexual Activity  . Alcohol use: No  . Drug use: No  .  Sexual activity: Not Currently    Birth control/protection: Post-menopausal, Surgical

## 2018-06-17 ENCOUNTER — Telehealth: Payer: Self-pay

## 2018-06-17 ENCOUNTER — Other Ambulatory Visit: Payer: Self-pay

## 2018-06-17 DIAGNOSIS — G4733 Obstructive sleep apnea (adult) (pediatric): Secondary | ICD-10-CM | POA: Diagnosis not present

## 2018-06-17 MED ORDER — FAMOTIDINE 20 MG PO TABS: 20.0000 mg | ORAL_TABLET | Freq: Two times a day (BID) | ORAL | 3 refills | Status: AC

## 2018-06-17 NOTE — Telephone Encounter (Signed)
Noted  

## 2018-06-17 NOTE — Telephone Encounter (Signed)
Spoke with pt this morning about medication being sent to the pharmacy. Medication Famotidine 20 mg was sent to pharmacy after being approved by GI. Pt verbalized understanding.

## 2018-06-19 ENCOUNTER — Encounter: Payer: Self-pay | Admitting: Family Medicine

## 2018-06-20 DIAGNOSIS — E1122 Type 2 diabetes mellitus with diabetic chronic kidney disease: Secondary | ICD-10-CM | POA: Diagnosis not present

## 2018-06-20 DIAGNOSIS — I130 Hypertensive heart and chronic kidney disease with heart failure and stage 1 through stage 4 chronic kidney disease, or unspecified chronic kidney disease: Secondary | ICD-10-CM | POA: Diagnosis not present

## 2018-06-20 DIAGNOSIS — N183 Chronic kidney disease, stage 3 (moderate): Secondary | ICD-10-CM | POA: Diagnosis not present

## 2018-06-20 DIAGNOSIS — I5030 Unspecified diastolic (congestive) heart failure: Secondary | ICD-10-CM | POA: Diagnosis not present

## 2018-06-20 DIAGNOSIS — M19042 Primary osteoarthritis, left hand: Secondary | ICD-10-CM | POA: Diagnosis not present

## 2018-06-20 DIAGNOSIS — I4819 Other persistent atrial fibrillation: Secondary | ICD-10-CM | POA: Diagnosis not present

## 2018-06-20 DIAGNOSIS — E1151 Type 2 diabetes mellitus with diabetic peripheral angiopathy without gangrene: Secondary | ICD-10-CM | POA: Diagnosis not present

## 2018-06-20 DIAGNOSIS — Z4781 Encounter for orthopedic aftercare following surgical amputation: Secondary | ICD-10-CM | POA: Diagnosis not present

## 2018-06-20 DIAGNOSIS — M19041 Primary osteoarthritis, right hand: Secondary | ICD-10-CM | POA: Diagnosis not present

## 2018-06-20 NOTE — Telephone Encounter (Signed)
Patient is calling and states that she raised her arm today and puss started draining again. Patient would like further advise. Please advise.   CB# [phone]

## 2018-06-20 NOTE — Progress Notes (Signed)
Cardiology Office Note    Date:  06/21/2018   ID:  Judith, [name] 03/09/1951, MRN [id number]  PCP:  Rutherford Guys, MD  Cardiologist: Fransico Him, MD EPS: Thompson Grayer, MD  Chief Complaint  Patient presents with  . Follow-up    History of Present Illness:  Judith [name] is a 68 y.o. 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  While in the hospital she had NSVT versus aberrancy and sotalol was stopped.  Patient had recurrent A. fib and underwent cardioversion 05/22/2018 but unfortunately was back in A. fib with RVR when she saw Roderic Palau, NP 05/30/2018.  She was on amiodarone load and Eliquis.  She could not increase her diltiazem further because of soft blood pressures Stop her Lasix.  She recommended that the patient could continue to load on amiodarone and bring her back in 10 days to arrange for cardioversion.  Back in the A. fib clinic 06/08/2018 was back in normal sinus rhythm.  Amiodarone decreased to 200 mg daily.  Was also in ER with low magnesium and Lasix was decreased to 20 mg daily.  Patient comes in today accompanied by her sister.  She is complaining of shoulder pain and needs rotator cuff surgery so she can do rehab on her BKA.  She has had no further fluid buildup.  She is getting regular magnesium checks and infusions because the Lasix is dropped her magnesium.  She is down to 20 mg of Lasix daily.  No chest pain, shortness of breath, palpitations.    Past Medical History:  Diagnosis Date  . Abnormal EKG 07/31/2013  . Arthritis    "hands" (03/06/2015)  . Asthma   . Carpal tunnel syndrome, bilateral   . Chronic kidney disease   . Complication of anesthesia    slow to wake up. Pt sts she woke up during surgery many years ago.  . Coronary artery disease     2  v CAD with CTO of the RCA and high grade bifurcational LCx/OM stenosis. S/P PCI DES x 2 to the LCx/OM.  Marland Kitchen Diabetic peripheral neuropathy ([place]) "since 1996"  . GERD (gastroesophageal reflux disease)   . Goiter   . Headache    migraines prior to menopause  . History of shingles 06/01/2013  . Hyperlipidemia LDL goal <70 10/13/2015  . Hypertension   . Hyperthyroidism   . Osteomyelitis of foot (HCC)    Right  . PAF (paroxysmal atrial fibrillation) ([place]) 04/29/2015   CHADS2VASC score of 5 now on Apixaban  . Pneumonia ~ 1976  . Sleep apnea    Bipap  . Tremors of nervous system   . Type II diabetes mellitus (HCC)    insulin dependent    Past Surgical History:  Procedure Laterality Date  . ABDOMINAL HYSTERECTOMY  1988   age 84; CERVICAL DYSPLASIA; ovaries intact.   . AMPUTATION Right 01/23/2016   Procedure: Right 3rd Ray Amputation;  Surgeon: [name], MD;  Location: [place];  Service: Orthopedics;  Laterality: Right;  . AMPUTATION Right 02/13/2016   Procedure: Right Transmetatarsal Amputation;  Surgeon: [name], MD;  Location: [place];  Service: Orthopedics;  Laterality: Right;  . AMPUTATION Right 04/15/2018   Procedure: AMPUTATION BELOW KNEE;  Surgeon: [name], MD;  Location: [place];  Service:  Orthopedics;  Laterality: Right;  . BIOPSY  04/16/2018   Procedure: BIOPSY;  Surgeon: Irene Shipper, MD;  Location: [facility] ENDOSCOPY;  Service: Endoscopy;;  . CARDIAC CATHETERIZATION N/A 02/27/2015   Procedure: Left Heart Cath and Coronary Angiography;  Surgeon: [name], MD; LAD 40%, mCFX 80%, OM 70%, RCA 100% calcified       . CARDIAC CATHETERIZATION N/A 03/06/2015   Procedure: Coronary Stent Intervention;  Surgeon: [name], MD;  Location: [place] [facility];  Service: Cardiovascular;  Laterality: N/A;  Mid CX 3.50x12 promus DES w/ 0% resdual and Prox OM1 2.50x20 promus DES w/ 20% residual  . CARDIOVERSION    . CARDIOVERSION N/A 05/22/2018   Procedure: CARDIOVERSION;  Surgeon:  Skeet Latch, MD;  Location: [place];  Service: Cardiovascular;  Laterality: N/A;  . CARPAL TUNNEL RELEASE Right Nov 2015  . CARPAL TUNNEL RELEASE Right 1992; 05/2014   [name]; [place]  . CESAREAN SECTION  1982; 1984  . ESOPHAGOGASTRODUODENOSCOPY (EGD) WITH PROPOFOL N/A 04/16/2018   Procedure: ESOPHAGOGASTRODUODENOSCOPY (EGD) WITH PROPOFOL;  Surgeon: Irene Shipper, MD;  Location: [facility] ENDOSCOPY;  Service: Endoscopy;  Laterality: N/A;  . FOOT NEUROMA SURGERY Bilateral 2000  . I&D EXTREMITY Left 01/06/2018   Procedure: DEBRIDEMENT ULCER LEFT FOOT;  Surgeon: [name], MD;  Location: [place];  Service: Orthopedics;  Laterality: Left;  . KNEE ARTHROSCOPY Right ~ 2003   "meniscus repair"  . SHOULDER OPEN ROTATOR CUFF REPAIR Right 1996; 1998   "w/fracture repair"  . THYROID SURGERY  2000   "removed lots of nodules"  . TONSILLECTOMY  1976    Current Medications: Current Meds  Medication Sig  . acetaminophen (TYLENOL) 325 MG tablet Take 1-2 tablets (325-650 mg total) by mouth every 6 (six) hours as needed for mild pain (pain score 1-3 or temp > 100.5).  Marland Kitchen albuterol (PROVENTIL HFA) 108 (90 Base) MCG/ACT inhaler Inhale 1-2 puffs into the lungs every 6 (six) hours as needed for wheezing or shortness of breath.  Marland Kitchen amiodarone (PACERONE) 200 MG tablet Take 1 tablet (200 mg total) by mouth daily.  Marland Kitchen apixaban (ELIQUIS) 5 MG TABS tablet Take 1 tablet (5 mg total) by mouth 2 (two) times daily.  Marland Kitchen atorvastatin (LIPITOR) 20 MG tablet Take 1 tablet (20 mg total) by mouth daily.  Marland Kitchen buPROPion (WELLBUTRIN SR) 150 MG 12 hr tablet Take 1 tablet (150 mg total) by mouth 2 (two) times daily.  Marland Kitchen diltiazem (CARDIZEM CD) 120 MG 24 hr capsule Take 1 capsule (120 mg total) by mouth daily.  . famotidine (PEPCID) 20 MG tablet Take 1 tablet (20 mg total) by mouth 2 (two) times daily.  . fenofibrate 160 MG tablet Take 1 tablet (160 mg total) by mouth daily.  Marland Kitchen FLUoxetine (PROZAC) 40 MG capsule Take 1 capsule  (40 mg total) by mouth at bedtime.  . gabapentin (NEURONTIN) 300 MG capsule TAKE 3 CAPSULES BY MOUTH IN THE MORNING AND TAKE 3 CAPSULES BY MOUTH AT BEDTIME  . glucose blood (TRUE METRIX BLOOD GLUCOSE TEST) test strip Used to check blood sugars 2x daily.  Marland Kitchen HYDROcodone-acetaminophen (NORCO/VICODIN) 5-325 MG tablet Take 1-2 tablets by mouth every 4 (four) hours as needed for moderate pain (pain score 4-6).  Marland Kitchen insulin degludec (TRESIBA FLEXTOUCH) 100 UNIT/ML SOPN FlexTouch Pen Inject 0.15 mLs (15 Units total) into the skin daily.  . Magnesium 250 MG TABS Take 1 tablet by mouth daily.  . metFORMIN (GLUCOPHAGE) 1000 MG tablet Take 1 tablet (1,000 mg total) by  mouth daily with breakfast.  . methimazole (TAPAZOLE) 10 MG tablet Take 1 tablet (10 mg total) by mouth daily.  . mupirocin ointment (BACTROBAN) 2 % Apply 1 application topically 3 (three) times daily.  . traMADol (ULTRAM) 50 MG tablet Take 50 mg by mouth every 6 (six) hours as needed.  . [DISCONTINUED] furosemide (LASIX) 20 MG tablet Take 1 tablet (20 mg total) by mouth daily.     Allergies:   Contrast media [iodinated diagnostic agents]; Novolog [insulin aspart]; Codeine; Iodine; Penicillins; Ace inhibitors; Demerol [meperidine]; Dilaudid [hydromorphone hcl]; Neosporin [neomycin-bacitracin zn-polymyx]; Percocet [oxycodone-acetaminophen]; and Tape   Social History   Socioeconomic History  . Marital status: Single    Spouse name: Not on file  . Number of children: 2  . Years of education: Masters  . Highest education level: Not on file  Occupational History  . Occupation: Landscape architect  Social Needs  . Financial resource strain: Somewhat hard  . Food insecurity:    Worry: Sometimes true    Inability: Sometimes true  . Transportation needs:    Medical: No    Non-medical: No  Tobacco Use  . Smoking status: Former Smoker    Packs/day: 0.00    Years: 41.00    Pack years: 0.00    Types: Cigarettes    Last attempt to quit:  03/05/2015    Years since quitting: 3.2  . Smokeless tobacco: Never Used  . Tobacco comment: 04/29/2015 "quit smoking cigarettes 02/27/2015"  Substance and Sexual Activity  . Alcohol use: No  . Drug use: No  . Sexual activity: Not Currently    Birth control/protection: Post-menopausal, Surgical  Lifestyle  . Physical activity:    Days per week: 0 days    Minutes per session: 0 min  . Stress: Very much  Relationships  . Social connections:    Talks on phone: More than three times a week    Gets together: More than three times a week    Attends religious service: More than 4 times per year    Active member of club or organization: No    Attends meetings of clubs or organizations: Never    Relationship status: Never married  Other Topics Concern  . 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      Lives with sister in [place].      Tobacco: 1 ppd x 41 years - quit 2016      Alcohol: none      Drugs: none      Exercise:  Walking in neighborhood; physical job.   Right-handed.   2 cups caffeine daily.     Family History:  The patient's family history includes Allergies in her sister; Breast cancer in her maternal grandmother and mother; COPD in her father; Cancer (age of onset: 34) in her mother; Emphysema in her maternal grandfather and paternal grandfather; Heart attack in her father; Heart disease (age of onset: 79) in her father; Hypertension in her father; Leukemia in her paternal grandmother; Lung cancer in her mother; Parkinson's disease in her father.   ROS:   Please see the history of present illness.    Review of Systems  Constitution: Negative.  HENT: Negative.   Eyes: Negative.   Cardiovascular: Negative.  Respiratory: Negative.     Hematologic/Lymphatic: Negative.   Musculoskeletal: Positive for arthritis and joint pain.  Gastrointestinal: Negative.   Genitourinary: Negative.   Neurological: Positive for weakness.  Psychiatric/Behavioral: Positive for memory loss.   All other systems reviewed and are negative.   PHYSICAL EXAM:   VS:  BP 138/88 (BP Location: Left Arm, Patient Position: Sitting, Cuff Size: Normal)   Pulse 72   Ht 5' 4" (1.626 m)   Wt 153 lb 6.4 oz (69.6 kg)   SpO2 99% Comment: at rest  BMI 26.33 kg/m   Physical Exam  GEN: Well nourished, well developed, in no acute distress  Neck: no JVD, carotid bruits, or masses Cardiac:RRR; 1/6 to 2/6 systolic murmurs left sternal border Respiratory:  clear to auscultation bilaterally, normal work of breathing GI: soft, nontender, nondistended, + BS Ext: Right BKA without cyanosis, clubbing, or edema, Good distal pulses left lower extremity Neuro:  Alert and Oriented x 3 Psych: euthymic mood, full affect  Wt Readings from Last 3 Encounters:  06/21/18 153 lb 6.4 oz (69.6 kg)  06/16/18 152 lb (68.9 kg)  06/16/18 153 lb (69.4 kg)      Studies/Labs Reviewed:   EKG:  EKG is not ordered today.  Recent Labs: 05/15/2018: TSH 1.013 06/08/2018: ALT 15; BUN 25; Creatinine, Ser 1.64; Hemoglobin 11.3; Platelets 445; Potassium 4.1; Sodium 132 06/14/2018: Magnesium 0.9   Lipid Panel    Component Value Date/Time   CHOL 236 (H) 07/22/2017 0857   TRIG 544 (H) 07/22/2017 0857   HDL 30 (L) 07/22/2017 0857   CHOLHDL 7.9 (H) 07/22/2017 0857   CHOLHDL 5.8 (H) 02/05/2016 1047   VLDL 76 (H) 02/05/2016 1047   [place] Comment 07/22/2017 0857   LDLDIRECT 97 01/06/2017 0904    Additional studies/ records that were reviewed today include:   2D echo 1/2020Study Conclusions   - Left ventricle: The cavity size was normal. Wall thickness was   increased in a pattern of severe LVH. Systolic function was   normal. The estimated ejection fraction was in the range of 60%    to 65%. Wall motion was normal; there were no regional wall   motion abnormalities. There was an increased relative   contribution of atrial contraction to ventricular filling.   Doppler parameters are consistent with abnormal left ventricular   relaxation (grade 1 diastolic dysfunction). Doppler parameters   are consistent with high ventricular filling pressure. - Aortic valve: Valve mobility was restricted. There was mild to   moderate stenosis. Mean gradient (S): 17 mm Hg. VTI ratio of LVOT   to aortic valve: 0.36. Valve area (VTI): 1.37 cm^2. Valve area   (Vmax): 1.22 cm^2. Valve area (Vmean): 1.27 cm^2. - Mitral valve: Calcified annulus. Valve area by pressure   half-time: 2.39 cm^2. - Left atrium: The atrium was moderately dilated. - Pulmonary arteries: Systolic pressure could not be accurately   estimated.     01/03/18: TTE Study Conclusions - Procedure narrative: Transthoracic echocardiography. Image   quality was adequate. The study was technically difficult. - Left ventricle: The cavity size was normal. Wall thickness was   increased in a pattern of mild LVH. Systolic function was normal.   The estimated ejection fraction was in the range of 55% to 60%.   Possible mild anterolateral hypokinesis. Features are consistent   with a pseudonormal left ventricular filling pattern, with   concomitant abnormal relaxation and increased filling pressure   (grade 2 diastolic dysfunction). - Aortic valve: Trileaflet; moderately calcified  leaflets. There   was moderate stenosis. Mean gradient (S): 19 mm Hg. Peak gradient   (S): 30 mm Hg. Valve area (VTI): 1.16 cm^2. - Mitral valve: Moderately calcified annulus. There was mild   regurgitation. - Left atrium: The atrium was mildly dilated. - Right ventricle: The cavity size was normal. Systolic function   was normal. - Pulmonary arteries: No complete TR doppler jet so unable to   estimate PA systolic pressure. - Systemic veins: IVC  measured 2.6 cm with < 50% respirophasic   variation, suggesting RA pressure 15 mmHg. - Pericardium, extracardiac: A trivial pericardial effusion was   identified. Impressions - Normal LV size with mild LV hypertrophy. EF 55-60% with possible   mild anterolateral hypokinesis. Moderate diastolic dysfunction.   Normal RV size and systolic function. Mild MR. Moderate aortic   stenosis. Dilated IVC suggestive of elevated RV filling pressure.     03/06/15: LHC/PCI Left Anterior Descending  Prox LAD to Mid LAD lesion 40% stenosed  calcified diffuse.  Left Circumflex  Mid Cx lesion 80% stenosed  calcified.  Second Obtuse Marginal Branch  Ost 2nd Mrg to 2nd Mrg lesion 70% stenosed  calcified diffuse.  Right Coronary Artery  Collaterals  Dist RCA filled by collaterals from Dist LAD.     Prox RCA lesion 100% stenosed  calcified diffuse.  Intervention    Mid Cx lesion  Angioplasty  Pre-stent angioplasty was performed. A drug-eluting stent was placed. A post-stent angioplasty was not performed. Maximum pressure: 16 atm. The pre-interventional distal flow is normal (TIMI 3). The post-interventional distal flow is normal (TIMI 3). The intervention was successful. No complications occured at this lesion. IC nitroglycerin was given. The mid circumflex/obtuse marginal bifurcation is treated. There is complex bifurcation stenosis present. I planned on using a bifurcation stenting technique. Angiomax is used for anticoagulation. The patient is adequately preloaded with aspirin and Plavix. A 7 French XB 3.5 cm guide catheter was utilized. Once a therapeutic ACT was achieved, a cougar guidewire is advanced into the OM. A second cougar guidewire is advanced into the distal circumflex. The obtuse marginal branches predilated with a 2.5 x 20 mm noncompliant balloon. The circumflex is then predilated with a 3.0 mm balloon. With the balloon present in the circumflex, a 2.5 x 20 mm Promus DES his advanced into  the OM branch. The stent is deployed so that the ostium is covered. Simultaneously, the circumflex balloon is dilated in order to keep the circumflex portion patent. The circumflex stent is then advanced so that it lays across the OM origin. A 3.5 x 12 mm Promus DES was chosen. This stent is deployed at 14 atm. I tried to rewire the OM branch but was unsuccessful. I decided to post dilate the circumflex stent in order to open the cells better. A 4.0 mm noncompliant balloon is chosen. It is dilated to 16 atm. I was then able to wire the obtuse marginal branch with a whisper wire. The OM stent is postdilated with a 2.5 mm noncompliant balloon. A final kissing balloon is done with the 4.0 mm balloon and the circumflex and the 2.5 mm balloon in the obtuse marginal. At the completion of the procedure there is 0% residual stenosis in the circumflex and 20% residual stenosis at the obtuse marginal origin. There is TIMI-3 flow into both vessels.  There is a 0% residual stenosis post intervention.  Ost 2nd Mrg to 2nd Mrg lesion  Angioplasty  Pre-stent angioplasty was performed. A drug-eluting stent was  placed. A post-stent angioplasty was not performed. The pre-interventional distal flow is normal (TIMI 3). The post-interventional distal flow is normal (TIMI 3). The intervention was successful. No complications occured at this lesion. IC nitroglycerin was given. See description under LCx lesion, bifurcation stenting technique utilized  There is a 20% residual stenosis post intervention.           ASSESSMENT:    1. Atrial fibrillation with rapid ventricular response ([place])   2. Coronary artery disease of native artery of native heart with stable angina pectoris ([place])   3. Chronic combined systolic and diastolic congestive heart failure ([place])   4. Essential hypertension   5. Hypomagnesemia      PLAN:  In order of problems listed above:  A. fib with RVR underwent cardioversion 05/22/2018 but reverted back  to A. fib.  Was on amiodarone load.  Roderic Palau recommended repeat cardioversion after 10 more days of amiodarone load but when patient came back to the A. fib clinic she was in normal sinus rhythm.  Magnesium low that day and she went to the ER for transfusion.  Heart rate regular today.  No EKG done.  Continue amiodarone and diltiazem.  Follow-up with Dr. already in 2 months.  CAD status post DES to the left circumflex/OM with known CTO of the RCA 2016  Chronic combined systolic and diastolic CHF LVEF normal with grade 1 DD on echo 05/2018.  I think a lot of her heart failure was around her rapid atrial fibrillation.  Will stop Lasix and she can take as needed for swelling or shortness of breath.  Follow-up with Dr. Radford Pax in for yearly.  Hypertension blood pressure has been stable.  HLD on Lipitor last fasting lipid panel 07/2017-should have repeat.  OSA on BiPAP  Hypomagnesemia down to 0.6 and 0.9 on 06/14/2018 treated with IV infusion-we will repeat magnesium today.  Stop Lasix.  Hopefully magnesium will come up without further need for transfusions   Medication Adjustments/Labs and Tests Ordered: Current medicines are reviewed at length with the patient today.  Concerns regarding medicines are outlined above.  Medication changes, Labs and Tests ordered today are listed in the Patient Instructions below. Patient Instructions  Medication Instructions:  Your physician has recommended you make the following change in your medication:   STOP: lasix  Lab work: TODAY: BMET, MG  If you have labs (blood work) drawn today and your tests are completely normal, you will receive your results only by: Marland Kitchen MyChart Message (if you have MyChart) OR . A paper copy in the mail If you have any lab test that is abnormal or we need to change your treatment, we will call you to review the results.  Testing/Procedures: None ordered  Follow-Up: Your physician recommends that you schedule a follow-up  appointment on 08/21/18 with Dr. Radford Pax at 11:40 AM   . Your physician recommends that you schedule a follow-up with Dr. Rayann Heman on 09/18/18 at 8:30 AM   Any Other Special Instructions Will Be Listed Below (If Applicable).       Sumner Boast, PA-C  06/21/2018 9:59 AM    [facility] [address], [place], [place]  [address] Phone: [phone]; Fax: [phone]

## 2018-06-21 ENCOUNTER — Encounter: Payer: Self-pay | Admitting: Physician Assistant

## 2018-06-21 ENCOUNTER — Ambulatory Visit (INDEPENDENT_AMBULATORY_CARE_PROVIDER_SITE_OTHER): Payer: Medicare PPO | Admitting: Physician Assistant

## 2018-06-21 VITALS — BP 138/88 | HR 72 | Ht 64.0 in | Wt 153.4 lb

## 2018-06-21 DIAGNOSIS — I5042 Chronic combined systolic (congestive) and diastolic (congestive) heart failure: Secondary | ICD-10-CM

## 2018-06-21 DIAGNOSIS — E1151 Type 2 diabetes mellitus with diabetic peripheral angiopathy without gangrene: Secondary | ICD-10-CM | POA: Diagnosis not present

## 2018-06-21 DIAGNOSIS — M19041 Primary osteoarthritis, right hand: Secondary | ICD-10-CM | POA: Diagnosis not present

## 2018-06-21 DIAGNOSIS — I25118 Atherosclerotic heart disease of native coronary artery with other forms of angina pectoris: Secondary | ICD-10-CM | POA: Diagnosis not present

## 2018-06-21 DIAGNOSIS — M19042 Primary osteoarthritis, left hand: Secondary | ICD-10-CM | POA: Diagnosis not present

## 2018-06-21 DIAGNOSIS — I5030 Unspecified diastolic (congestive) heart failure: Secondary | ICD-10-CM | POA: Diagnosis not present

## 2018-06-21 DIAGNOSIS — I10 Essential (primary) hypertension: Secondary | ICD-10-CM

## 2018-06-21 DIAGNOSIS — Z4781 Encounter for orthopedic aftercare following surgical amputation: Secondary | ICD-10-CM | POA: Diagnosis not present

## 2018-06-21 DIAGNOSIS — E1122 Type 2 diabetes mellitus with diabetic chronic kidney disease: Secondary | ICD-10-CM | POA: Diagnosis not present

## 2018-06-21 DIAGNOSIS — I4819 Other persistent atrial fibrillation: Secondary | ICD-10-CM | POA: Diagnosis not present

## 2018-06-21 DIAGNOSIS — I130 Hypertensive heart and chronic kidney disease with heart failure and stage 1 through stage 4 chronic kidney disease, or unspecified chronic kidney disease: Secondary | ICD-10-CM | POA: Diagnosis not present

## 2018-06-21 DIAGNOSIS — I4891 Unspecified atrial fibrillation: Secondary | ICD-10-CM

## 2018-06-21 DIAGNOSIS — N183 Chronic kidney disease, stage 3 (moderate): Secondary | ICD-10-CM | POA: Diagnosis not present

## 2018-06-21 LAB — BASIC METABOLIC PANEL
BUN / CREAT RATIO: 21 (ref 12–28)
BUN: 27 mg/dL (ref 8–27)
CHLORIDE: 99 mmol/L (ref 96–106)
CO2: 22 mmol/L (ref 20–29)
Calcium: 10.6 mg/dL — ABNORMAL HIGH (ref 8.7–10.3)
Creatinine, Ser: 1.3 mg/dL — ABNORMAL HIGH (ref 0.57–1.00)
GFR calc Af Amer: 49 mL/min/{1.73_m2} — ABNORMAL LOW (ref >59)
GFR calc non Af Amer: 43 mL/min/{1.73_m2} — ABNORMAL LOW (ref >59)
Glucose: 96 mg/dL (ref 65–99)
Potassium: 4.9 mmol/L (ref 3.5–5.2)
Sodium: 136 mmol/L (ref 134–144)

## 2018-06-21 LAB — MAGNESIUM: Magnesium: 1.9 mg/dL (ref 1.6–2.3)

## 2018-06-21 NOTE — Patient Instructions (Addendum)
Medication Instructions:  Your physician has recommended you make the following change in your medication:   STOP: lasix  Lab work: TODAY: BMET, MG  If you have labs (blood work) drawn today and your tests are completely normal, you will receive your results only by: Marland Kitchen MyChart Message (if you have MyChart) OR . A paper copy in the mail If you have any lab test that is abnormal or we need to change your treatment, we will call you to review the results.  Testing/Procedures: None ordered  Follow-Up: Your physician recommends that you schedule a follow-up appointment on 08/21/18 with Dr. Radford Pax at 11:40 AM   . Your physician recommends that you schedule a follow-up with Dr. Rayann Heman on 09/18/18 at 8:30 AM   Any Other Special Instructions Will Be Listed Below (If Applicable).

## 2018-06-22 DIAGNOSIS — I130 Hypertensive heart and chronic kidney disease with heart failure and stage 1 through stage 4 chronic kidney disease, or unspecified chronic kidney disease: Secondary | ICD-10-CM | POA: Diagnosis not present

## 2018-06-22 DIAGNOSIS — E1122 Type 2 diabetes mellitus with diabetic chronic kidney disease: Secondary | ICD-10-CM | POA: Diagnosis not present

## 2018-06-22 DIAGNOSIS — M19042 Primary osteoarthritis, left hand: Secondary | ICD-10-CM | POA: Diagnosis not present

## 2018-06-22 DIAGNOSIS — N183 Chronic kidney disease, stage 3 (moderate): Secondary | ICD-10-CM | POA: Diagnosis not present

## 2018-06-22 DIAGNOSIS — M19041 Primary osteoarthritis, right hand: Secondary | ICD-10-CM | POA: Diagnosis not present

## 2018-06-22 DIAGNOSIS — Z4781 Encounter for orthopedic aftercare following surgical amputation: Secondary | ICD-10-CM | POA: Diagnosis not present

## 2018-06-22 DIAGNOSIS — I5030 Unspecified diastolic (congestive) heart failure: Secondary | ICD-10-CM | POA: Diagnosis not present

## 2018-06-22 DIAGNOSIS — E1151 Type 2 diabetes mellitus with diabetic peripheral angiopathy without gangrene: Secondary | ICD-10-CM | POA: Diagnosis not present

## 2018-06-22 DIAGNOSIS — I4819 Other persistent atrial fibrillation: Secondary | ICD-10-CM | POA: Diagnosis not present

## 2018-06-23 ENCOUNTER — Ambulatory Visit: Payer: Medicare PPO | Admitting: Emergency Medicine

## 2018-06-23 ENCOUNTER — Ambulatory Visit (INDEPENDENT_AMBULATORY_CARE_PROVIDER_SITE_OTHER): Payer: Medicare PPO | Admitting: Psychology

## 2018-06-23 DIAGNOSIS — F064 Anxiety disorder due to known physiological condition: Secondary | ICD-10-CM

## 2018-06-23 DIAGNOSIS — Z736 Limitation of activities due to disability: Secondary | ICD-10-CM

## 2018-06-23 DIAGNOSIS — F418 Other specified anxiety disorders: Secondary | ICD-10-CM | POA: Diagnosis not present

## 2018-06-23 DIAGNOSIS — Z899 Acquired absence of limb, unspecified: Secondary | ICD-10-CM | POA: Diagnosis not present

## 2018-06-26 DIAGNOSIS — Z91048 Other nonmedicinal substance allergy status: Secondary | ICD-10-CM | POA: Diagnosis not present

## 2018-06-26 DIAGNOSIS — R112 Nausea with vomiting, unspecified: Secondary | ICD-10-CM | POA: Diagnosis not present

## 2018-06-26 DIAGNOSIS — E1110 Type 2 diabetes mellitus with ketoacidosis without coma: Secondary | ICD-10-CM | POA: Diagnosis not present

## 2018-06-26 DIAGNOSIS — Z880 Allergy status to penicillin: Secondary | ICD-10-CM | POA: Diagnosis not present

## 2018-06-26 DIAGNOSIS — Z91041 Radiographic dye allergy status: Secondary | ICD-10-CM | POA: Diagnosis not present

## 2018-06-26 DIAGNOSIS — I618 Other nontraumatic intracerebral hemorrhage: Secondary | ICD-10-CM | POA: Diagnosis not present

## 2018-06-26 DIAGNOSIS — Z885 Allergy status to narcotic agent status: Secondary | ICD-10-CM | POA: Diagnosis not present

## 2018-06-26 DIAGNOSIS — I619 Nontraumatic intracerebral hemorrhage, unspecified: Secondary | ICD-10-CM | POA: Diagnosis not present

## 2018-06-26 DIAGNOSIS — R079 Chest pain, unspecified: Secondary | ICD-10-CM | POA: Diagnosis not present

## 2018-06-26 DIAGNOSIS — Z951 Presence of aortocoronary bypass graft: Secondary | ICD-10-CM | POA: Diagnosis not present

## 2018-06-26 DIAGNOSIS — I610 Nontraumatic intracerebral hemorrhage in hemisphere, subcortical: Secondary | ICD-10-CM | POA: Diagnosis not present

## 2018-06-26 DIAGNOSIS — Z888 Allergy status to other drugs, medicaments and biological substances status: Secondary | ICD-10-CM | POA: Diagnosis not present

## 2018-06-26 DIAGNOSIS — Z89511 Acquired absence of right leg below knee: Secondary | ICD-10-CM | POA: Diagnosis not present

## 2018-06-27 ENCOUNTER — Telehealth: Payer: Self-pay | Admitting: Physician Assistant

## 2018-06-27 ENCOUNTER — Telehealth: Payer: Self-pay | Admitting: Family Medicine

## 2018-06-27 DIAGNOSIS — N3000 Acute cystitis without hematuria: Secondary | ICD-10-CM | POA: Diagnosis not present

## 2018-06-27 DIAGNOSIS — I252 Old myocardial infarction: Secondary | ICD-10-CM | POA: Diagnosis not present

## 2018-06-27 DIAGNOSIS — I619 Nontraumatic intracerebral hemorrhage, unspecified: Secondary | ICD-10-CM | POA: Insufficient documentation

## 2018-06-27 DIAGNOSIS — I6523 Occlusion and stenosis of bilateral carotid arteries: Secondary | ICD-10-CM | POA: Diagnosis not present

## 2018-06-27 DIAGNOSIS — N179 Acute kidney failure, unspecified: Secondary | ICD-10-CM | POA: Diagnosis not present

## 2018-06-27 DIAGNOSIS — R739 Hyperglycemia, unspecified: Secondary | ICD-10-CM | POA: Diagnosis not present

## 2018-06-27 DIAGNOSIS — I359 Nonrheumatic aortic valve disorder, unspecified: Secondary | ICD-10-CM | POA: Diagnosis not present

## 2018-06-27 DIAGNOSIS — I059 Rheumatic mitral valve disease, unspecified: Secondary | ICD-10-CM | POA: Diagnosis not present

## 2018-06-27 DIAGNOSIS — N170 Acute kidney failure with tubular necrosis: Secondary | ICD-10-CM | POA: Diagnosis not present

## 2018-06-27 DIAGNOSIS — I10 Essential (primary) hypertension: Secondary | ICD-10-CM | POA: Diagnosis not present

## 2018-06-27 DIAGNOSIS — Z79899 Other long term (current) drug therapy: Secondary | ICD-10-CM | POA: Diagnosis not present

## 2018-06-27 DIAGNOSIS — I350 Nonrheumatic aortic (valve) stenosis: Secondary | ICD-10-CM | POA: Diagnosis not present

## 2018-06-27 DIAGNOSIS — Z888 Allergy status to other drugs, medicaments and biological substances status: Secondary | ICD-10-CM | POA: Diagnosis not present

## 2018-06-27 DIAGNOSIS — N183 Chronic kidney disease, stage 3 (moderate): Secondary | ICD-10-CM | POA: Diagnosis not present

## 2018-06-27 DIAGNOSIS — I610 Nontraumatic intracerebral hemorrhage in hemisphere, subcortical: Secondary | ICD-10-CM | POA: Diagnosis not present

## 2018-06-27 DIAGNOSIS — E1122 Type 2 diabetes mellitus with diabetic chronic kidney disease: Secondary | ICD-10-CM | POA: Diagnosis not present

## 2018-06-27 DIAGNOSIS — D473 Essential (hemorrhagic) thrombocythemia: Secondary | ICD-10-CM | POA: Diagnosis not present

## 2018-06-27 DIAGNOSIS — E1165 Type 2 diabetes mellitus with hyperglycemia: Secondary | ICD-10-CM | POA: Diagnosis not present

## 2018-06-27 DIAGNOSIS — R51 Headache: Secondary | ICD-10-CM | POA: Diagnosis not present

## 2018-06-27 DIAGNOSIS — Z23 Encounter for immunization: Secondary | ICD-10-CM | POA: Diagnosis not present

## 2018-06-27 DIAGNOSIS — I519 Heart disease, unspecified: Secondary | ICD-10-CM | POA: Diagnosis not present

## 2018-06-27 DIAGNOSIS — Z794 Long term (current) use of insulin: Secondary | ICD-10-CM | POA: Diagnosis not present

## 2018-06-27 DIAGNOSIS — D696 Thrombocytopenia, unspecified: Secondary | ICD-10-CM | POA: Diagnosis not present

## 2018-06-27 DIAGNOSIS — D649 Anemia, unspecified: Secondary | ICD-10-CM | POA: Diagnosis not present

## 2018-06-27 DIAGNOSIS — Z880 Allergy status to penicillin: Secondary | ICD-10-CM | POA: Diagnosis not present

## 2018-06-27 DIAGNOSIS — I629 Nontraumatic intracranial hemorrhage, unspecified: Secondary | ICD-10-CM | POA: Diagnosis not present

## 2018-06-27 DIAGNOSIS — I480 Paroxysmal atrial fibrillation: Secondary | ICD-10-CM | POA: Diagnosis not present

## 2018-06-27 DIAGNOSIS — I160 Hypertensive urgency: Secondary | ICD-10-CM | POA: Diagnosis not present

## 2018-06-27 DIAGNOSIS — Z7901 Long term (current) use of anticoagulants: Secondary | ICD-10-CM | POA: Diagnosis not present

## 2018-06-27 NOTE — Telephone Encounter (Signed)
New Message:     Tye Maryland wanted you to let Ermalinda Barrios know that the pt was transferred to [facility]. She has a bleed on the brain.

## 2018-06-27 NOTE — Telephone Encounter (Unsigned)
Copied from [place] [phone]. Topic: General - Other >> Jun 27, 2018  2:40 PM Yvette Rack wrote: Reason for CRM: Sister Tye Maryland calling to let Dr Sid Falcon know that pt went to [facility] for a magnesium test last night they did a CT scsn of brain and saw some bleeding on brain she is at [facility] until they release her

## 2018-06-27 NOTE — Telephone Encounter (Signed)
FYI

## 2018-06-30 ENCOUNTER — Ambulatory Visit: Payer: Medicare PPO | Admitting: Family Medicine

## 2018-06-30 ENCOUNTER — Telehealth: Payer: Self-pay | Admitting: Family Medicine

## 2018-06-30 DIAGNOSIS — N390 Urinary tract infection, site not specified: Secondary | ICD-10-CM | POA: Insufficient documentation

## 2018-06-30 NOTE — Telephone Encounter (Signed)
FYI

## 2018-06-30 NOTE — Telephone Encounter (Signed)
Copied from [place] [phone]. Topic: Quick Communication - See Telephone Encounter >> Jun 30, 2018  2:18 PM Blase Mess A wrote: CRM for notification. See Telephone encounter for: 06/30/18.  Darlina Guys calling from [facility] is calling because the patient discharged from the hospital today. And will be able to go out to start PT on Monday. [phone]

## 2018-07-03 ENCOUNTER — Telehealth: Payer: Self-pay | Admitting: Family Medicine

## 2018-07-03 DIAGNOSIS — E1151 Type 2 diabetes mellitus with diabetic peripheral angiopathy without gangrene: Secondary | ICD-10-CM | POA: Diagnosis not present

## 2018-07-03 DIAGNOSIS — I130 Hypertensive heart and chronic kidney disease with heart failure and stage 1 through stage 4 chronic kidney disease, or unspecified chronic kidney disease: Secondary | ICD-10-CM | POA: Diagnosis not present

## 2018-07-03 DIAGNOSIS — M19041 Primary osteoarthritis, right hand: Secondary | ICD-10-CM | POA: Diagnosis not present

## 2018-07-03 DIAGNOSIS — M19042 Primary osteoarthritis, left hand: Secondary | ICD-10-CM | POA: Diagnosis not present

## 2018-07-03 DIAGNOSIS — Z4781 Encounter for orthopedic aftercare following surgical amputation: Secondary | ICD-10-CM | POA: Diagnosis not present

## 2018-07-03 DIAGNOSIS — N183 Chronic kidney disease, stage 3 (moderate): Secondary | ICD-10-CM | POA: Diagnosis not present

## 2018-07-03 DIAGNOSIS — I5032 Chronic diastolic (congestive) heart failure: Secondary | ICD-10-CM | POA: Diagnosis not present

## 2018-07-03 DIAGNOSIS — E1122 Type 2 diabetes mellitus with diabetic chronic kidney disease: Secondary | ICD-10-CM | POA: Diagnosis not present

## 2018-07-03 DIAGNOSIS — I69018 Other symptoms and signs involving cognitive functions following nontraumatic subarachnoid hemorrhage: Secondary | ICD-10-CM | POA: Diagnosis not present

## 2018-07-03 NOTE — Telephone Encounter (Signed)
noted 

## 2018-07-03 NOTE — Telephone Encounter (Signed)
Copied from [place] [id number]. Topic: Quick Communication - Home Health Verbal Orders >> Jul 03, 2018  4:04 PM Alfredia Ferguson R wrote: Caller/Agency: Cindee Salt / [facility] Number: [phone] Requesting OT/PT/Skilled Nursing/Social Work: PT Frequency: 2 WEEK 2

## 2018-07-04 ENCOUNTER — Encounter: Payer: Self-pay | Admitting: Podiatry

## 2018-07-04 ENCOUNTER — Ambulatory Visit (INDEPENDENT_AMBULATORY_CARE_PROVIDER_SITE_OTHER): Payer: Medicare PPO | Admitting: Podiatry

## 2018-07-04 VITALS — Temp 98.9°F

## 2018-07-04 DIAGNOSIS — M79674 Pain in right toe(s): Secondary | ICD-10-CM | POA: Diagnosis not present

## 2018-07-04 DIAGNOSIS — M79675 Pain in left toe(s): Secondary | ICD-10-CM

## 2018-07-04 DIAGNOSIS — B351 Tinea unguium: Secondary | ICD-10-CM

## 2018-07-04 DIAGNOSIS — E1149 Type 2 diabetes mellitus with other diabetic neurological complication: Secondary | ICD-10-CM | POA: Diagnosis not present

## 2018-07-05 ENCOUNTER — Telehealth: Payer: Self-pay | Admitting: Family Medicine

## 2018-07-05 DIAGNOSIS — I69018 Other symptoms and signs involving cognitive functions following nontraumatic subarachnoid hemorrhage: Secondary | ICD-10-CM | POA: Diagnosis not present

## 2018-07-05 DIAGNOSIS — Z4781 Encounter for orthopedic aftercare following surgical amputation: Secondary | ICD-10-CM | POA: Diagnosis not present

## 2018-07-05 DIAGNOSIS — N183 Chronic kidney disease, stage 3 (moderate): Secondary | ICD-10-CM | POA: Diagnosis not present

## 2018-07-05 DIAGNOSIS — E1151 Type 2 diabetes mellitus with diabetic peripheral angiopathy without gangrene: Secondary | ICD-10-CM | POA: Diagnosis not present

## 2018-07-05 DIAGNOSIS — M19042 Primary osteoarthritis, left hand: Secondary | ICD-10-CM | POA: Diagnosis not present

## 2018-07-05 DIAGNOSIS — I5032 Chronic diastolic (congestive) heart failure: Secondary | ICD-10-CM | POA: Diagnosis not present

## 2018-07-05 DIAGNOSIS — E1122 Type 2 diabetes mellitus with diabetic chronic kidney disease: Secondary | ICD-10-CM | POA: Diagnosis not present

## 2018-07-05 DIAGNOSIS — I130 Hypertensive heart and chronic kidney disease with heart failure and stage 1 through stage 4 chronic kidney disease, or unspecified chronic kidney disease: Secondary | ICD-10-CM | POA: Diagnosis not present

## 2018-07-05 DIAGNOSIS — M19041 Primary osteoarthritis, right hand: Secondary | ICD-10-CM | POA: Diagnosis not present

## 2018-07-05 NOTE — Progress Notes (Signed)
Subjective: 68 y.o. returns the office today for painful, elongated, thickened toenails which they cannot trim herself.  She states that the left foot is been doing well she has no open sores.  Since I last saw her she developed a wound on the right side which ultimately required a below-knee amputation.  She has been fitted for prosthetic currently.  She seems to be in good spirits about this.  She has no new concerns and she denies any fevers, chills, nausea, vomiting she denies any calf pain, chest pain, shortness of breath currently.   PCP: Rutherford Guys, MD Last Seen: 06/16/2018  A1c: 7.5 on 06/07/2018  Objective: AAO 3, NAD DP/PT pulses palpable Protective sensation decreased with Derrel Nip monofilament Nails hypertrophic, dystrophic, elongated, brittle, discolored 5. There is tenderness overlying the nails 1-5 bilaterally. There is no surrounding erythema or drainage along the nail sites. No open lesions or pre-ulcerative lesions are identified. No other areas of tenderness bilateral lower extremities. No overlying edema, erythema, increased warmth. No pain with calf compression, swelling, warmth, erythema.  Assessment: Patient presents with symptomatic onychomycosis  Plan: -Treatment options including alternatives, risks, complications were discussed -Nails sharply debrided 5 without complication/bleeding. -Discussed daily foot inspection. If there are any changes, to call the office immediately.  -Follow-up in 3 months or sooner if any problems are to arise. In the meantime, encouraged to call the office with any questions, concerns, changes symptoms.  Celesta Gentile, DPM

## 2018-07-05 NOTE — Telephone Encounter (Signed)
Copied from [place] [id number]. Topic: General - Other >> Jul 05, 2018  3:04 PM Gustavus Messing wrote: Reason for CRM: Someone called wanting to let Dr. Pamella Pert know that an OT evaluation was completed and has no further OT needs

## 2018-07-05 NOTE — Telephone Encounter (Signed)
Verbal given 

## 2018-07-06 ENCOUNTER — Ambulatory Visit (INDEPENDENT_AMBULATORY_CARE_PROVIDER_SITE_OTHER): Payer: Medicare PPO | Admitting: Psychology

## 2018-07-06 DIAGNOSIS — F064 Anxiety disorder due to known physiological condition: Secondary | ICD-10-CM | POA: Diagnosis not present

## 2018-07-07 ENCOUNTER — Encounter: Payer: Self-pay | Admitting: Family Medicine

## 2018-07-07 ENCOUNTER — Other Ambulatory Visit: Payer: Self-pay

## 2018-07-07 ENCOUNTER — Ambulatory Visit (INDEPENDENT_AMBULATORY_CARE_PROVIDER_SITE_OTHER): Payer: Medicare PPO | Admitting: Family Medicine

## 2018-07-07 VITALS — BP 128/88 | HR 69 | Temp 98.8°F | Ht 64.0 in | Wt 153.0 lb

## 2018-07-07 DIAGNOSIS — I619 Nontraumatic intracerebral hemorrhage, unspecified: Secondary | ICD-10-CM | POA: Diagnosis not present

## 2018-07-07 DIAGNOSIS — N183 Chronic kidney disease, stage 3 unspecified: Secondary | ICD-10-CM

## 2018-07-07 DIAGNOSIS — I10 Essential (primary) hypertension: Secondary | ICD-10-CM | POA: Diagnosis not present

## 2018-07-07 DIAGNOSIS — Z794 Long term (current) use of insulin: Secondary | ICD-10-CM

## 2018-07-07 DIAGNOSIS — D500 Iron deficiency anemia secondary to blood loss (chronic): Secondary | ICD-10-CM

## 2018-07-07 DIAGNOSIS — F418 Other specified anxiety disorders: Secondary | ICD-10-CM

## 2018-07-07 DIAGNOSIS — E1169 Type 2 diabetes mellitus with other specified complication: Secondary | ICD-10-CM | POA: Diagnosis not present

## 2018-07-07 DIAGNOSIS — G4733 Obstructive sleep apnea (adult) (pediatric): Secondary | ICD-10-CM | POA: Diagnosis not present

## 2018-07-07 MED ORDER — DILTIAZEM HCL ER COATED BEADS 120 MG PO CP24: 120.0000 mg | ORAL_CAPSULE | Freq: Every day | ORAL | 1 refills | Status: DC

## 2018-07-07 NOTE — Progress Notes (Signed)
2/28/202011:38 AM  DAWNN NAM [birth year], 68 y.o. female [id number]  Chief Complaint  Patient presents with  . Follow-up    feeling ok, had some diarrhea and stays cold  . Medication Refill    needs a refill on the cardizem, has nit takn this med since tuesday    HPI:   Patient is a 68 y.o. female with past medical history significant for DM2,afib,CAD,peripheral neuropathy, and right foot amputation,hypomagenesemia, anemia from chronic blood loss,  who presents today for hosp follow up  Chart review: admiited 06/26/2018 for DKA, L thalamic ICH, hypertensive urgency, UTI Transferred from [facility] to [facility] Discharged on 06/30/2018 eliquis was discontinued, treated with DOAC reversal agents Other meds dc gabapentin, methocarbamol, feofibrate, lasix Started on keflex, hydralazine, melatonin Changed mag and metoprolol  Echo LVEF normal, G1 DD, mild- mod AS, normal LA  Ct scan head  IMPRESSION: Small hyperdense focus posterior left thalamus not significantly changed from 06/26/2018 and most likely reflects dystrophic mineralization/calcified lesion rather than parenchymal hemorrhage. No definite acute intracranial abnormality. Electronically Signed by: Eddie Dibbles  Carotid dopplers: no HD sign stenosis  ICH due to HTN, ok to resume eliquis after 2 weeks Fenofibrate dc 2/2 ARF, atorvastatin was not increased due to recent [facility]  Patient/sister's history provided today: Here with her sister, main caregiver A week ago found unresponsive by her sister She became very confused, hallucinating, paranoia while in hosp Her sister had noticed she had not urinated in a while They feel that they did not have good communication while in hospital and feel was fragmented as unable to see our records Now back to normal mental status and normal function Getting speech thru home health Eating better Normal urination Has been going to counseling and feels it is being beneficial Will  be getting new prosthesis for R BKA soon - very excited Has upcoming appt with cards, wondering is she needs to see stroke clinic  Patient Care Team: Rutherford Guys, MD as PCP - General (Family Medicine) Thompson Grayer, MD as PCP - Electrophysiology (Cardiology) Sueanne Margarita, MD as PCP - Cardiology (Cardiology) Iran Planas, MD as Consulting Physician (Orthopedic Surgery) Marygrace Drought, MD as Consulting Physician (Ophthalmology) Renato Shin, MD as Consulting Physician (Endocrinology) Sueanne Margarita, MD as Consulting Physician (Cardiology)  Fall Risk  07/07/2018 06/16/2018 05/25/2018 04/07/2018 02/06/2018  Falls in the past year? 0 0 0 0 Yes  Number falls in past yr: 0 0 - - 2 or more  Injury with Fall? 0 0 - - No  Comment - - - - -  Risk Factor Category  - - - - -  Risk for fall due to : - - - - -  Risk for fall due to: Comment - - - - -  Follow up - - - - -     Depression screen [facility] 2/9 07/07/2018 06/16/2018 05/25/2018  Decreased Interest 0 0 0  Down, Depressed, Hopeless 0 0 0  PHQ - 2 Score 0 0 0  Some recent data might be hidden    Allergies  Allergen Reactions  . Contrast Media [Iodinated Diagnostic Agents] Hives and Other (See Comments)    Spoke to patient, Iodine allergy is really IV contrast allergy.   [name] [Insulin Aspart] Shortness Of Breath and Other (See Comments)    "breathing problems"  . Codeine Nausea And Vomiting and Other (See Comments)    HIGH DOSES-SEVERE VOMITING  . Iodine Other (See Comments)    MUST HAVE BENADRYL  PRIOR TO PROCEDURE AND RIGHT BEFORE TREATMENT TO COUNTERACT REACTION-BLISTERING REACTION DERMATOLOGICAL  . Penicillins Itching, Rash and Other (See Comments)    Has patient had a PCN reaction causing immediate rash, facial/tongue/throat swelling, SOB or lightheadedness with hypotension: no Has patient had a PCN reaction causing severe rash involving mucus membranes or skin necrosis: No Has patient had a PCN reaction that required  hospitalization No Has patient had a PCN reaction occurring within the last 10 years: No If all of the above answers are "NO", then may proceed with Cephalosporin use.  CHEST SIZED RASH AND ITCHING   . Ace Inhibitors Cough  . Demerol [Meperidine] Nausea And Vomiting  . Dilaudid [Hydromorphone Hcl] Other (See Comments)    HEADACHE   . Neosporin [Neomycin-Bacitracin Zn-Polymyx] Itching, Rash and Other (See Comments)    MAKES REACTIONS WORSE WHEN USING AS PROPHYLACTIC  . Percocet [Oxycodone-Acetaminophen] Rash  . Tape Itching and Rash    Prior to Admission medications   Medication Sig Start Date End Date Taking? Authorizing Provider  acetaminophen (TYLENOL) 325 MG tablet Take 1-2 tablets (325-650 mg total) by mouth every 6 (six) hours as needed for mild pain (pain score 1-3 or temp > 100.5). 05/01/18  Yes Angiulli, Lavon Paganini, PA-C  albuterol (PROVENTIL HFA) 108 (90 Base) MCG/ACT inhaler Inhale 1-2 puffs into the lungs every 6 (six) hours as needed for wheezing or shortness of breath. 05/01/18  Yes Angiulli, Lavon Paganini, PA-C  amiodarone (PACERONE) 200 MG tablet Take 1 tablet (200 mg total) by mouth daily. 06/08/18  Yes Sherran Needs, NP  apixaban (ELIQUIS) 5 MG TABS tablet Take 1 tablet (5 mg total) by mouth 2 (two) times daily. 05/01/18  Yes Angiulli, Lavon Paganini, PA-C  atorvastatin (LIPITOR) 20 MG tablet Take 1 tablet (20 mg total) by mouth daily. 05/01/18  Yes Angiulli, Lavon Paganini, PA-C  buPROPion [facility] SR) 150 MG 12 hr tablet Take 1 tablet (150 mg total) by mouth 2 (two) times daily. 05/01/18  Yes Angiulli, Lavon Paganini, PA-C  diltiazem (CARDIZEM CD) 120 MG 24 hr capsule Take 1 capsule (120 mg total) by mouth daily. 05/24/18  Yes Patrecia Pour, MD  famotidine (PEPCID) 20 MG tablet Take 1 tablet (20 mg total) by mouth 2 (two) times daily. 06/17/18  Yes Rutherford Guys, MD  fenofibrate 160 MG tablet Take 1 tablet (160 mg total) by mouth daily. 05/02/18  Yes Angiulli, Lavon Paganini, PA-C  FLUoxetine  (PROZAC) 40 MG capsule Take 1 capsule (40 mg total) by mouth at bedtime. 05/25/18  Yes Rutherford Guys, MD  gabapentin (NEURONTIN) 300 MG capsule TAKE 3 CAPSULES BY MOUTH IN THE MORNING AND TAKE 3 CAPSULES BY MOUTH AT BEDTIME 06/11/18  Yes Rutherford Guys, MD  glucose blood (TRUE METRIX BLOOD GLUCOSE TEST) test strip Used to check blood sugars 2x daily. 08/25/17  Yes Renato Shin, MD  HYDROcodone-acetaminophen (NORCO/VICODIN) 5-325 MG tablet Take 1-2 tablets by mouth every 4 (four) hours as needed for moderate pain (pain score 4-6). 05/01/18  Yes Angiulli, Lavon Paganini, PA-C  insulin degludec (TRESIBA FLEXTOUCH) 100 UNIT/ML SOPN FlexTouch Pen Inject 0.15 mLs (15 Units total) into the skin daily. 04/03/18  Yes Renato Shin, MD  Magnesium 250 MG TABS Take 1 tablet by mouth daily.   Yes [provider]  metFORMIN (GLUCOPHAGE) 1000 MG tablet Take 1 tablet (1,000 mg total) by mouth daily with breakfast. 06/07/18  Yes Renato Shin, MD  methimazole (TAPAZOLE) 10 MG tablet Take 1 tablet (10 mg  total) by mouth daily. 05/01/18  Yes Angiulli, Lavon Paganini, PA-C  mupirocin ointment (BACTROBAN) 2 % Apply 1 application topically 3 (three) times daily. 06/16/18  Yes Rutherford Guys, MD  traMADol (ULTRAM) 50 MG tablet Take 50 mg by mouth every 6 (six) hours as needed.   Yes [provider]    Past Medical History:  Diagnosis Date  . Abnormal EKG 07/31/2013  . Arthritis    "hands" (03/06/2015)  . Asthma   . Carpal tunnel syndrome, bilateral   . Chronic kidney disease   . Complication of anesthesia    slow to wake up. Pt sts she woke up during surgery many years ago.  . Coronary artery disease     2 v CAD with CTO of the RCA and high grade bifurcational LCx/OM stenosis. S/P PCI DES x 2 to the LCx/OM.  Marland Kitchen Diabetic peripheral neuropathy ([place]) "since 1996"  . GERD (gastroesophageal reflux disease)   . Goiter   . Headache    migraines prior to menopause  . History of shingles 06/01/2013  .  Hyperlipidemia LDL goal <70 10/13/2015  . Hypertension   . Hyperthyroidism   . Osteomyelitis of foot (HCC)    Right  . PAF (paroxysmal atrial fibrillation) ([place]) 04/29/2015   CHADS2VASC score of 5 now on Apixaban  . Pneumonia ~ 1976  . Sleep apnea    Bipap  . Tremors of nervous system   . Type II diabetes mellitus (HCC)    insulin dependent    Past Surgical History:  Procedure Laterality Date  . ABDOMINAL HYSTERECTOMY  1988   age 73; CERVICAL DYSPLASIA; ovaries intact.   . AMPUTATION Right 01/23/2016   Procedure: Right 3rd Ray Amputation;  Surgeon: [name], MD;  Location: [place];  Service: Orthopedics;  Laterality: Right;  . AMPUTATION Right 02/13/2016   Procedure: Right Transmetatarsal Amputation;  Surgeon: [name], MD;  Location: [place];  Service: Orthopedics;  Laterality: Right;  . AMPUTATION Right 04/15/2018   Procedure: AMPUTATION BELOW KNEE;  Surgeon: [name], MD;  Location: [place];  Service: Orthopedics;  Laterality: Right;  . BIOPSY  04/16/2018   Procedure: BIOPSY;  Surgeon: Irene Shipper, MD;  Location: [facility] ENDOSCOPY;  Service: Endoscopy;;  . CARDIAC CATHETERIZATION N/A 02/27/2015   Procedure: Left Heart Cath and Coronary Angiography;  Surgeon: [name], MD; LAD 40%, mCFX 80%, OM 70%, RCA 100% calcified       . CARDIAC CATHETERIZATION N/A 03/06/2015   Procedure: Coronary Stent Intervention;  Surgeon: [name], MD;  Location: [place] CV LAB;  Service: Cardiovascular;  Laterality: N/A;  Mid CX 3.50x12 promus DES w/ 0% resdual and Prox OM1 2.50x20 promus DES w/ 20% residual  . CARDIOVERSION    . CARDIOVERSION N/A 05/22/2018   Procedure: CARDIOVERSION;  Surgeon: Skeet Latch, MD;  Location: [place];  Service: Cardiovascular;  Laterality: N/A;  . CARPAL TUNNEL RELEASE Right Nov 2015  . CARPAL TUNNEL RELEASE Right 1992; 05/2014   [name]; [place]  . CESAREAN SECTION  1982; 1984  . ESOPHAGOGASTRODUODENOSCOPY (EGD) WITH PROPOFOL N/A 04/16/2018    Procedure: ESOPHAGOGASTRODUODENOSCOPY (EGD) WITH PROPOFOL;  Surgeon: Irene Shipper, MD;  Location: [facility] ENDOSCOPY;  Service: Endoscopy;  Laterality: N/A;  . FOOT NEUROMA SURGERY Bilateral 2000  . I&D EXTREMITY Left 01/06/2018   Procedure: DEBRIDEMENT ULCER LEFT FOOT;  Surgeon: [name], MD;  Location: [place];  Service: Orthopedics;  Laterality: Left;  . KNEE ARTHROSCOPY Right ~ 2003   "  meniscus repair"  . SHOULDER OPEN ROTATOR CUFF REPAIR Right 1996; 1998   "w/fracture repair"  . THYROID SURGERY  2000   "removed lots of nodules"  . TONSILLECTOMY  1976    Social History   Tobacco Use  . Smoking status: Former Smoker    Packs/day: 0.00    Years: 41.00    Pack years: 0.00    Types: Cigarettes    Last attempt to quit: 03/05/2015    Years since quitting: 3.3  . Smokeless tobacco: Never Used  . Tobacco comment: 04/29/2015 "quit smoking cigarettes 02/27/2015"  Substance Use Topics  . Alcohol use: No    Family History  Problem Relation Age of Onset  . Cancer Mother 68       bronchial cancer  . Breast cancer Mother   . Lung cancer Mother   . Hypertension Father   . COPD Father   . Heart disease Father 59       CAD with cardiac stenting  . Heart attack Father   . Parkinson's disease Father   . Allergies Sister   . Breast cancer Maternal Grandmother   . Emphysema Maternal Grandfather   . Leukemia Paternal Grandmother   . Emphysema Paternal Grandfather   . Thyroid disease Neg Hx     Review of Systems  Constitutional: Negative for chills and fever.  Respiratory: Negative for cough and shortness of breath.   Cardiovascular: Negative for chest pain, palpitations and leg swelling.  Gastrointestinal: Negative for abdominal pain, nausea and vomiting.     OBJECTIVE:  Blood pressure 128/88, pulse 69, temperature 98.8 F (37.1 C), temperature source Oral, height 5\' 4"  (1.626 m), weight 153 lb (69.4 kg), SpO2 97 %. Body mass index is 26.26 kg/m.   Physical Exam Vitals  signs and nursing note reviewed.  Constitutional:      Appearance: She is well-developed.  HENT:     Head: Normocephalic and atraumatic.  Eyes:     General: No scleral icterus.    Conjunctiva/sclera: Conjunctivae normal.     Pupils: Pupils are equal, round, and reactive to light.  Neck:     Musculoskeletal: Neck supple.  Pulmonary:     Effort: Pulmonary effort is normal.  Skin:    General: Skin is warm and dry.  Neurological:     Mental Status: She is alert and oriented to person, place, and time.    Lab Results  Component Value Date   HGBA1C 7.5 (A) 06/07/2018   HGBA1C 8.0 (H) 04/14/2018   HGBA1C 7.9 (A) 04/03/2018   Lab Results  Component Value Date   MICROALBUR 22.7 (H) 12/06/2014   [place] Comment 07/22/2017   CREATININE 1.30 (H) 06/21/2018    ASSESSMENT and PLAN   1. Left-sided nontraumatic intracerebral hemorrhage, unspecified cerebral location [facility]) Thought to be 2/2 HTN, unclear if timing, will restart eliquis as instructed at 2 weeks from [place]. No sequela. Patient has many specialists and medical appt, she would rather not add one more by going to the stroke clinic, which seems reasonable, no sequela and working on secondary prevention measures.   2. Essential hypertension Controlled. Continue current regime.   3. CKD (chronic kidney disease), stage III ([place]) Recent ARF while in [place]. rechecking labs to determine new baseline. Has upcoming appt with renal  4. Depression with anxiety Improved. Cont with current regime  5. Hypomagnesemia On high dose oral mag, having some loose stools as expected. Discussed imp of keeping hydrated being mindful of fluid status. Thought to  be 2/2 lasix and PPI both of which have been discontinued. Cont to monitor mag level, patient would like to decrease oral supplement if able - Magnesium  6. Anemia due to chronic blood loss - CBC  7. Type 2 diabetes mellitus with other specified complication, with long-term current use of  insulin (HCC) Last a1c close to goal. Continue with current regime - Comprehensive metabolic panel  Other orders - diltiazem (CARDIZEM CD) 120 MG 24 hr capsule; Take 1 capsule (120 mg total) by mouth daily.  Return in about 1 week (around 07/14/2018).    Rutherford Guys, MD Primary Care at [facility], [place] [address] Ph.  [phone] Fax [phone]

## 2018-07-07 NOTE — Patient Instructions (Signed)
° ° ° °  If you have lab work done today you will be contacted with your lab results within the next 2 weeks.  If you have not heard from us then please contact us. The fastest way to get your results is to register for My Chart. ° ° °IF you received an x-ray today, you will receive an invoice from [facility]. Please contact [facility] at [phone] with questions or concerns regarding your invoice.  ° °IF you received labwork today, you will receive an invoice from [facility]. Please contact [facility] at [phone] with questions or concerns regarding your invoice.  ° °Our billing staff will not be able to assist you with questions regarding bills from these companies. ° °You will be contacted with the lab results as soon as they are available. The fastest way to get your results is to activate your My Chart account. Instructions are located on the last page of this paperwork. If you have not heard from us regarding the results in 2 weeks, please contact this office. °  ° ° ° °

## 2018-07-08 LAB — COMPREHENSIVE METABOLIC PANEL
ALT: 28 IU/L (ref 0–32)
AST: 35 IU/L (ref 0–40)
Albumin/Globulin Ratio: 1.3 (ref 1.2–2.2)
Albumin: 4 g/dL (ref 3.8–4.8)
Alkaline Phosphatase: 73 IU/L (ref 39–117)
BUN/Creatinine Ratio: 20 (ref 12–28)
BUN: 22 mg/dL (ref 8–27)
Bilirubin Total: <0.2 mg/dL (ref 0.0–1.2)
CO2: 23 mmol/L (ref 20–29)
Calcium: 10.5 mg/dL — ABNORMAL HIGH (ref 8.7–10.3)
Chloride: 97 mmol/L (ref 96–106)
Creatinine, Ser: 1.08 mg/dL — ABNORMAL HIGH (ref 0.57–1.00)
GFR calc Af Amer: 62 mL/min/{1.73_m2} (ref >59)
GFR calc non Af Amer: 54 mL/min/{1.73_m2} — ABNORMAL LOW (ref >59)
Globulin, Total: 3 g/dL (ref 1.5–4.5)
Glucose: 106 mg/dL — ABNORMAL HIGH (ref 65–99)
Potassium: 5 mmol/L (ref 3.5–5.2)
Sodium: 138 mmol/L (ref 134–144)
Total Protein: 7 g/dL (ref 6.0–8.5)

## 2018-07-08 LAB — CBC
Hematocrit: 31 % — ABNORMAL LOW (ref 34.0–46.6)
Hemoglobin: 10.5 g/dL — ABNORMAL LOW (ref 11.1–15.9)
MCH: 29.5 pg (ref 26.6–33.0)
MCHC: 33.9 g/dL (ref 31.5–35.7)
MCV: 87 fL (ref 79–97)
Platelets: 490 10*3/uL — ABNORMAL HIGH (ref 150–450)
RBC: 3.56 x10E6/uL — ABNORMAL LOW (ref 3.77–5.28)
RDW: 14.2 % (ref 11.7–15.4)
WBC: 8.3 10*3/uL (ref 3.4–10.8)

## 2018-07-08 LAB — MAGNESIUM: Magnesium: 2 mg/dL (ref 1.6–2.3)

## 2018-07-10 ENCOUNTER — Ambulatory Visit: Payer: Medicare PPO | Admitting: Family Medicine

## 2018-07-10 ENCOUNTER — Telehealth: Payer: Self-pay | Admitting: Family Medicine

## 2018-07-10 DIAGNOSIS — I69018 Other symptoms and signs involving cognitive functions following nontraumatic subarachnoid hemorrhage: Secondary | ICD-10-CM | POA: Diagnosis not present

## 2018-07-10 DIAGNOSIS — E1151 Type 2 diabetes mellitus with diabetic peripheral angiopathy without gangrene: Secondary | ICD-10-CM | POA: Diagnosis not present

## 2018-07-10 DIAGNOSIS — E1122 Type 2 diabetes mellitus with diabetic chronic kidney disease: Secondary | ICD-10-CM | POA: Diagnosis not present

## 2018-07-10 DIAGNOSIS — N183 Chronic kidney disease, stage 3 (moderate): Secondary | ICD-10-CM | POA: Diagnosis not present

## 2018-07-10 DIAGNOSIS — M19042 Primary osteoarthritis, left hand: Secondary | ICD-10-CM | POA: Diagnosis not present

## 2018-07-10 DIAGNOSIS — M19041 Primary osteoarthritis, right hand: Secondary | ICD-10-CM | POA: Diagnosis not present

## 2018-07-10 DIAGNOSIS — Z4781 Encounter for orthopedic aftercare following surgical amputation: Secondary | ICD-10-CM | POA: Diagnosis not present

## 2018-07-10 DIAGNOSIS — I5032 Chronic diastolic (congestive) heart failure: Secondary | ICD-10-CM | POA: Diagnosis not present

## 2018-07-10 DIAGNOSIS — I130 Hypertensive heart and chronic kidney disease with heart failure and stage 1 through stage 4 chronic kidney disease, or unspecified chronic kidney disease: Secondary | ICD-10-CM | POA: Diagnosis not present

## 2018-07-10 NOTE — Telephone Encounter (Signed)
Copied from [place] [id number]. Topic: Quick Communication - Home Health Verbal Orders >> Jul 10, 2018  1:45 PM Gustavus Messing wrote: Caller/Agency: [facility] Callback Number: [phone] Junie Panning from [place] OT/PT/Skilled Nursing/Social Work: Speech Therapy Frequency: New Order to move evaluation to this week from last week (No one ever answered the phone) Can leave a message because she has a Psychologist, occupational

## 2018-07-11 ENCOUNTER — Telehealth: Payer: Self-pay | Admitting: Family Medicine

## 2018-07-11 NOTE — Telephone Encounter (Signed)
Gave verbal orders for Speech Therapy to Deborra Medina, she verbalized understanding.

## 2018-07-11 NOTE — Telephone Encounter (Signed)
Copied from [place] [id number] [id number]. Topic: General - Other >> Jul 11, 2018  9:48 AM Keene Breath wrote: Reason for CRM: Deborra Medina with [facility] called to get verbal orders for Speech Therapy for patient - 1x wk for 1 wk, 2x wk for 3 wks to address cognition.  Please advise and call back at [phone]

## 2018-07-11 NOTE — Telephone Encounter (Signed)
Verbal orders given  

## 2018-07-12 ENCOUNTER — Ambulatory Visit: Payer: Self-pay | Admitting: Gastroenterology

## 2018-07-12 DIAGNOSIS — E1122 Type 2 diabetes mellitus with diabetic chronic kidney disease: Secondary | ICD-10-CM | POA: Diagnosis not present

## 2018-07-12 DIAGNOSIS — I130 Hypertensive heart and chronic kidney disease with heart failure and stage 1 through stage 4 chronic kidney disease, or unspecified chronic kidney disease: Secondary | ICD-10-CM | POA: Diagnosis not present

## 2018-07-12 DIAGNOSIS — I5032 Chronic diastolic (congestive) heart failure: Secondary | ICD-10-CM | POA: Diagnosis not present

## 2018-07-12 DIAGNOSIS — E1151 Type 2 diabetes mellitus with diabetic peripheral angiopathy without gangrene: Secondary | ICD-10-CM | POA: Diagnosis not present

## 2018-07-12 DIAGNOSIS — I69018 Other symptoms and signs involving cognitive functions following nontraumatic subarachnoid hemorrhage: Secondary | ICD-10-CM | POA: Diagnosis not present

## 2018-07-12 DIAGNOSIS — N183 Chronic kidney disease, stage 3 (moderate): Secondary | ICD-10-CM | POA: Diagnosis not present

## 2018-07-12 DIAGNOSIS — M19042 Primary osteoarthritis, left hand: Secondary | ICD-10-CM | POA: Diagnosis not present

## 2018-07-12 DIAGNOSIS — M19041 Primary osteoarthritis, right hand: Secondary | ICD-10-CM | POA: Diagnosis not present

## 2018-07-12 DIAGNOSIS — Z4781 Encounter for orthopedic aftercare following surgical amputation: Secondary | ICD-10-CM | POA: Diagnosis not present

## 2018-07-16 DIAGNOSIS — G4733 Obstructive sleep apnea (adult) (pediatric): Secondary | ICD-10-CM | POA: Diagnosis not present

## 2018-07-17 ENCOUNTER — Other Ambulatory Visit: Payer: Self-pay

## 2018-07-17 ENCOUNTER — Encounter: Payer: Self-pay | Admitting: Family Medicine

## 2018-07-17 ENCOUNTER — Ambulatory Visit (INDEPENDENT_AMBULATORY_CARE_PROVIDER_SITE_OTHER): Payer: Medicare PPO | Admitting: Family Medicine

## 2018-07-17 VITALS — BP 130/84 | HR 60 | Temp 97.7°F | Ht 64.0 in | Wt 150.0 lb

## 2018-07-17 DIAGNOSIS — N183 Chronic kidney disease, stage 3 unspecified: Secondary | ICD-10-CM

## 2018-07-17 DIAGNOSIS — I10 Essential (primary) hypertension: Secondary | ICD-10-CM | POA: Diagnosis not present

## 2018-07-17 DIAGNOSIS — H40053 Ocular hypertension, bilateral: Secondary | ICD-10-CM | POA: Diagnosis not present

## 2018-07-17 DIAGNOSIS — H524 Presbyopia: Secondary | ICD-10-CM | POA: Diagnosis not present

## 2018-07-17 DIAGNOSIS — E113393 Type 2 diabetes mellitus with moderate nonproliferative diabetic retinopathy without macular edema, bilateral: Secondary | ICD-10-CM | POA: Diagnosis not present

## 2018-07-17 DIAGNOSIS — H35372 Puckering of macula, left eye: Secondary | ICD-10-CM | POA: Diagnosis not present

## 2018-07-17 LAB — HM DIABETES EYE EXAM

## 2018-07-17 MED ORDER — MAGNESIUM 400 MG PO TABS: 400.0000 mg | ORAL_TABLET | ORAL | Status: DC

## 2018-07-17 NOTE — Progress Notes (Signed)
3/9/202012:21 PM  Judith [name] Dec 08, 1950, 68 y.o. female [id number]  Chief Complaint  Patient presents with  . Follow-up    no longer having loose stool and staying hydrated. Going today to get the sleeve for right leg    HPI:   Patient is a 68 y.o. female with past medical history significant for DM2,afib,CAD,peripheral neuropathy, and right foot amputation,hypomagenesemia, anemia from chronic blood loss,who presents today for routine folllowup  Last OV feb 2020 Loose stools have resolved, still mg as prescribed Will be getting sleeve fitting today for BKA Having mild rash from site of ekg stickers, has triamcinolone at home wondering if ok to use Recently had eye exam, stable retinopathy Restarted eliquis and has had no issues  Lab Results  Component Value Date   CREATININE 1.08 (H) 07/07/2018   BUN 22 07/07/2018   NA 138 07/07/2018   K 5.0 07/07/2018   CL 97 07/07/2018   CO2 23 07/07/2018   Lab Results  Component Value Date   CREATININE 1.08 (H) 07/07/2018   CREATININE 1.30 (H) 06/21/2018   CREATININE 1.64 (H) 06/08/2018   magnesium 07/07/2018 2.0  Lab Results  Component Value Date   HGBA1C 7.5 (A) 06/07/2018  managed by endo   Fall Risk  07/07/2018 06/16/2018 05/25/2018 04/07/2018 02/06/2018  Falls in the past year? 0 0 0 0 Yes  Number falls in past yr: 0 0 - - 2 or more  Injury with Fall? 0 0 - - No  Comment - - - - -  Risk Factor Category  - - - - -  Risk for fall due to : - - - - -  Risk for fall due to: Comment - - - - -  Follow up - - - - -     Depression screen [facility] 2/9 07/07/2018 06/16/2018 05/25/2018  Decreased Interest 0 0 0  Down, Depressed, Hopeless 0 0 0  PHQ - 2 Score 0 0 0  Some recent data might be hidden    Allergies  Allergen Reactions  . Contrast Media [Iodinated Diagnostic Agents] Hives and Other (See Comments)    Spoke to patient, Iodine allergy is really IV contrast allergy.   [name] [Insulin Aspart] Shortness Of Breath and  Other (See Comments)    "breathing problems"  . Codeine Nausea And Vomiting and Other (See Comments)    HIGH DOSES-SEVERE VOMITING  . Iodine Other (See Comments)    MUST HAVE BENADRYL PRIOR TO PROCEDURE AND RIGHT BEFORE TREATMENT TO COUNTERACT REACTION-BLISTERING REACTION DERMATOLOGICAL  . Penicillins Itching, Rash and Other (See Comments)    Has patient had a PCN reaction causing immediate rash, facial/tongue/throat swelling, SOB or lightheadedness with hypotension: no Has patient had a PCN reaction causing severe rash involving mucus membranes or skin necrosis: No Has patient had a PCN reaction that required hospitalization No Has patient had a PCN reaction occurring within the last 10 years: No If all of the above answers are "NO", then may proceed with Cephalosporin use.  CHEST SIZED RASH AND ITCHING   . Ace Inhibitors Cough  . Demerol [Meperidine] Nausea And Vomiting  . Dilaudid [Hydromorphone Hcl] Other (See Comments)    HEADACHE   . Neosporin [Neomycin-Bacitracin Zn-Polymyx] Itching, Rash and Other (See Comments)    MAKES REACTIONS WORSE WHEN USING AS PROPHYLACTIC  . Percocet [Oxycodone-Acetaminophen] Rash  . Tape Itching and Rash    Prior to Admission medications   Medication Sig Start Date End Date Taking? Authorizing Provider  acetaminophen (TYLENOL) 325 MG tablet Take 1-2 tablets (325-650 mg total) by mouth every 6 (six) hours as needed for mild pain (pain score 1-3 or temp > 100.5). 05/01/18  Yes Angiulli, Lavon Paganini, PA-C  albuterol (PROVENTIL HFA) 108 (90 Base) MCG/ACT inhaler Inhale 1-2 puffs into the lungs every 6 (six) hours as needed for wheezing or shortness of breath. 05/01/18  Yes Angiulli, Lavon Paganini, PA-C  amiodarone (PACERONE) 200 MG tablet Take 1 tablet (200 mg total) by mouth daily. 06/08/18  Yes Sherran Needs, NP  apixaban (ELIQUIS) 5 MG TABS tablet Take 1 tablet (5 mg total) by mouth 2 (two) times daily. 05/01/18  Yes Angiulli, Lavon Paganini, PA-C  atorvastatin  (LIPITOR) 20 MG tablet Take 1 tablet (20 mg total) by mouth daily. 05/01/18  Yes Angiulli, Lavon Paganini, PA-C  buPROPion [facility] SR) 150 MG 12 hr tablet Take 1 tablet (150 mg total) by mouth 2 (two) times daily. 05/01/18  Yes Angiulli, Lavon Paganini, PA-C  diltiazem (CARDIZEM CD) 120 MG 24 hr capsule Take 1 capsule (120 mg total) by mouth daily. 07/07/18  Yes Rutherford Guys, MD  famotidine (PEPCID) 20 MG tablet Take 1 tablet (20 mg total) by mouth 2 (two) times daily. 06/17/18  Yes Rutherford Guys, MD  fenofibrate 160 MG tablet Take 1 tablet (160 mg total) by mouth daily. 05/02/18  Yes Angiulli, Lavon Paganini, PA-C  gabapentin (NEURONTIN) 300 MG capsule TAKE 3 CAPSULES BY MOUTH IN THE MORNING AND TAKE 3 CAPSULES BY MOUTH AT BEDTIME 06/11/18  Yes Rutherford Guys, MD  glucose blood (TRUE METRIX BLOOD GLUCOSE TEST) test strip Used to check blood sugars 2x daily. 08/25/17  Yes Renato Shin, MD  insulin degludec (TRESIBA FLEXTOUCH) 100 UNIT/ML SOPN FlexTouch Pen Inject 0.15 mLs (15 Units total) into the skin daily. 04/03/18  Yes Renato Shin, MD  Magnesium 250 MG TABS Take 1 tablet by mouth daily.   Yes [provider]  metFORMIN (GLUCOPHAGE) 1000 MG tablet Take 1 tablet (1,000 mg total) by mouth daily with breakfast. 06/07/18  Yes Renato Shin, MD  methimazole (TAPAZOLE) 10 MG tablet Take 1 tablet (10 mg total) by mouth daily. 05/01/18  Yes Angiulli, Lavon Paganini, PA-C  mupirocin ointment (BACTROBAN) 2 % Apply 1 application topically 3 (three) times daily. 06/16/18  Yes Rutherford Guys, MD  traMADol (ULTRAM) 50 MG tablet Take 50 mg by mouth every 6 (six) hours as needed.   Yes [provider]    Past Medical History:  Diagnosis Date  . Abnormal EKG 07/31/2013  . Arthritis    "hands" (03/06/2015)  . Asthma   . Carpal tunnel syndrome, bilateral   . Chronic kidney disease   . Complication of anesthesia    slow to wake up. Pt sts she woke up during surgery many years ago.  . Coronary artery  disease     2 v CAD with CTO of the RCA and high grade bifurcational LCx/OM stenosis. S/P PCI DES x 2 to the LCx/OM.  Marland Kitchen Diabetic peripheral neuropathy ([place]) "since 1996"  . GERD (gastroesophageal reflux disease)   . Goiter   . Headache    migraines prior to menopause  . History of shingles 06/01/2013  . Hyperlipidemia LDL goal <70 10/13/2015  . Hypertension   . Hyperthyroidism   . Osteomyelitis of foot (HCC)    Right  . PAF (paroxysmal atrial fibrillation) ([place]) 04/29/2015   CHADS2VASC score of 5 now on Apixaban  . Pneumonia ~ 1976  .  Sleep apnea    Bipap  . Tremors of nervous system   . Type II diabetes mellitus (HCC)    insulin dependent    Past Surgical History:  Procedure Laterality Date  . ABDOMINAL HYSTERECTOMY  1988   age 63; CERVICAL DYSPLASIA; ovaries intact.   . AMPUTATION Right 01/23/2016   Procedure: Right 3rd Ray Amputation;  Surgeon: [name], MD;  Location: [place];  Service: Orthopedics;  Laterality: Right;  . AMPUTATION Right 02/13/2016   Procedure: Right Transmetatarsal Amputation;  Surgeon: [name], MD;  Location: [place];  Service: Orthopedics;  Laterality: Right;  . AMPUTATION Right 04/15/2018   Procedure: AMPUTATION BELOW KNEE;  Surgeon: [name], MD;  Location: [place];  Service: Orthopedics;  Laterality: Right;  . BIOPSY  04/16/2018   Procedure: BIOPSY;  Surgeon: Irene Shipper, MD;  Location[facility];  Service: Endoscopy;;  . CARDIAC CATHETERIZATION N/A 02/27/2015   Procedure: Left Heart Cath and Coronary Angiography;  Surgeon: [name], MD; LAD 40%, mCFX 80%, OM 70%, RCA 100% calcified       . CARDIAC CATHETERIZATION N/A 03/06/2015   Procedure: Coronary Stent Intervention;  Surgeon: [name], MD;  Location: [place] CV LAB;  Service: Cardiovascular;  Laterality: N/A;  Mid CX 3.50x12 promus DES w/ 0% resdual and Prox OM1 2.50x20 promus DES w/ 20% residual  . CARDIOVERSION    . CARDIOVERSION N/A 05/22/2018   Procedure:  CARDIOVERSION;  Surgeon: Skeet Latch, MD;  Location: [place];  Service: Cardiovascular;  Laterality: N/A;  . CARPAL TUNNEL RELEASE Right Nov 2015  . CARPAL TUNNEL RELEASE Right 1992; 05/2014   [name]; [place]  . CESAREAN SECTION  1982; 1984  . ESOPHAGOGASTRODUODENOSCOPY (EGD) WITH PROPOFOL N/A 04/16/2018   Procedure: ESOPHAGOGASTRODUODENOSCOPY (EGD) WITH PROPOFOL;  Surgeon: Irene Shipper, MD;  Location: [facility] ENDOSCOPY;  Service: Endoscopy;  Laterality: N/A;  . FOOT NEUROMA SURGERY Bilateral 2000  . I&D EXTREMITY Left 01/06/2018   Procedure: DEBRIDEMENT ULCER LEFT FOOT;  Surgeon: [name], MD;  Location: [place];  Service: Orthopedics;  Laterality: Left;  . KNEE ARTHROSCOPY Right ~ 2003   "meniscus repair"  . SHOULDER OPEN ROTATOR CUFF REPAIR Right 1996; 1998   "w/fracture repair"  . THYROID SURGERY  2000   "removed lots of nodules"  . TONSILLECTOMY  1976    Social History   Tobacco Use  . Smoking status: Former Smoker    Packs/day: 0.00    Years: 41.00    Pack years: 0.00    Types: Cigarettes    Last attempt to quit: 03/05/2015    Years since quitting: 3.3  . Smokeless tobacco: Never Used  . Tobacco comment: 04/29/2015 "quit smoking cigarettes 02/27/2015"  Substance Use Topics  . Alcohol use: No    Family History  Problem Relation Age of Onset  . Cancer Mother 20       bronchial cancer  . Breast cancer Mother   . Lung cancer Mother   . Hypertension Father   . COPD Father   . Heart disease Father 83       CAD with cardiac stenting  . Heart attack Father   . Parkinson's disease Father   . Allergies Sister   . Breast cancer Maternal Grandmother   . Emphysema Maternal Grandfather   . Leukemia Paternal Grandmother   . Emphysema Paternal Grandfather   . Thyroid disease Neg Hx     Review of Systems  Constitutional: Negative for chills and  fever.  Respiratory: Negative for cough and shortness of breath.   Cardiovascular: Negative for chest pain,  palpitations and leg swelling.  Gastrointestinal: Negative for abdominal pain, nausea and vomiting.   Per hpi  OBJECTIVE:  Blood pressure 130/84, pulse 60, temperature 97.7 F (36.5 C), temperature source Oral, height 5\' 4"  (1.626 m), weight 150 lb (68 kg), SpO2 99 %. Body mass index is 25.75 kg/m.   Wt Readings from Last 3 Encounters:  07/17/18 150 lb (68 kg)  07/07/18 153 lb (69.4 kg)  06/21/18 153 lb 6.4 oz (69.6 kg)    Physical Exam Vitals signs and nursing note reviewed.  Constitutional:      Appearance: She is well-developed.  HENT:     Head: Normocephalic and atraumatic.     Mouth/Throat:     Pharynx: No oropharyngeal exudate.  Eyes:     General: No scleral icterus.    Conjunctiva/sclera: Conjunctivae normal.     Pupils: Pupils are equal, round, and reactive to light.  Neck:     Musculoskeletal: Neck supple.  Cardiovascular:     Rate and Rhythm: Normal rate and regular rhythm.     Heart sounds: Murmur present. No friction rub. No gallop.   Pulmonary:     Effort: Pulmonary effort is normal.     Breath sounds: Normal breath sounds. No wheezing or rales.  Skin:    General: Skin is warm and dry.  Neurological:     Mental Status: She is alert and oriented to person, place, and time.     ASSESSMENT and PLAN  1. Essential hypertension At goal. Continue current regime  2. CKD (chronic kidney disease), stage III (HCC) Improving. As appt with renal in April  3. Hypomagnesemia At goal. Loose stools resolved. Cont magnesium 400mg  BID   Return in about 3 months (around 10/17/2018).    Rutherford Guys, MD Primary Care at [facility] [place], [place] [address] Ph.  [phone] Fax [phone]

## 2018-07-17 NOTE — Patient Instructions (Signed)
° ° ° °  If you have lab work done today you will be contacted with your lab results within the next 2 weeks.  If you have not heard from us then please contact us. The fastest way to get your results is to register for My Chart. ° ° °IF you received an x-ray today, you will receive an invoice from [facility]. Please contact [facility] at [phone] with questions or concerns regarding your invoice.  ° °IF you received labwork today, you will receive an invoice from [facility]. Please contact [facility] at [phone] with questions or concerns regarding your invoice.  ° °Our billing staff will not be able to assist you with questions regarding bills from these companies. ° °You will be contacted with the lab results as soon as they are available. The fastest way to get your results is to activate your My Chart account. Instructions are located on the last page of this paperwork. If you have not heard from us regarding the results in 2 weeks, please contact this office. °  ° ° ° °

## 2018-07-18 DIAGNOSIS — N183 Chronic kidney disease, stage 3 (moderate): Secondary | ICD-10-CM | POA: Diagnosis not present

## 2018-07-18 DIAGNOSIS — M19041 Primary osteoarthritis, right hand: Secondary | ICD-10-CM | POA: Diagnosis not present

## 2018-07-18 DIAGNOSIS — M19042 Primary osteoarthritis, left hand: Secondary | ICD-10-CM | POA: Diagnosis not present

## 2018-07-18 DIAGNOSIS — Z4781 Encounter for orthopedic aftercare following surgical amputation: Secondary | ICD-10-CM | POA: Diagnosis not present

## 2018-07-18 DIAGNOSIS — E1151 Type 2 diabetes mellitus with diabetic peripheral angiopathy without gangrene: Secondary | ICD-10-CM | POA: Diagnosis not present

## 2018-07-18 DIAGNOSIS — E1122 Type 2 diabetes mellitus with diabetic chronic kidney disease: Secondary | ICD-10-CM | POA: Diagnosis not present

## 2018-07-18 DIAGNOSIS — I69018 Other symptoms and signs involving cognitive functions following nontraumatic subarachnoid hemorrhage: Secondary | ICD-10-CM | POA: Diagnosis not present

## 2018-07-18 DIAGNOSIS — I5032 Chronic diastolic (congestive) heart failure: Secondary | ICD-10-CM | POA: Diagnosis not present

## 2018-07-18 DIAGNOSIS — I130 Hypertensive heart and chronic kidney disease with heart failure and stage 1 through stage 4 chronic kidney disease, or unspecified chronic kidney disease: Secondary | ICD-10-CM | POA: Diagnosis not present

## 2018-07-18 IMAGING — CR DG TOE 3RD 2+V*R*
3 series · 4 of 4 positions shown · non-contrast
Comparison: None.

CLINICAL DATA: Redness and swelling.  Diabetes

EXAM:
RIGHT THIRD TOE

[toe ap]
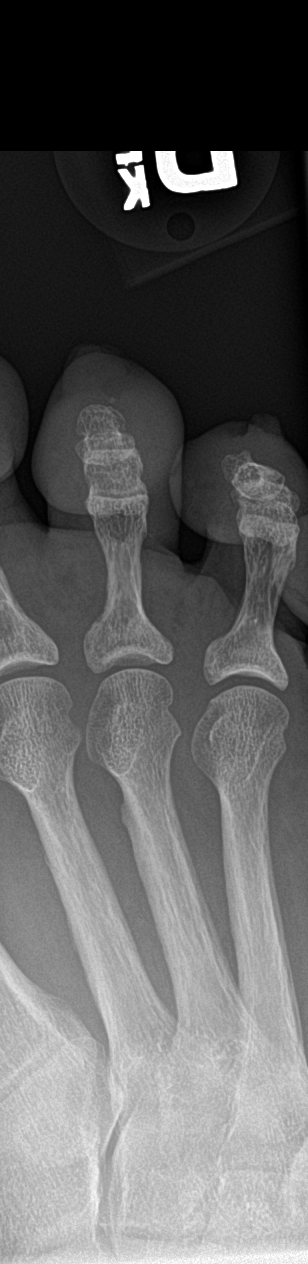

[toe obl]
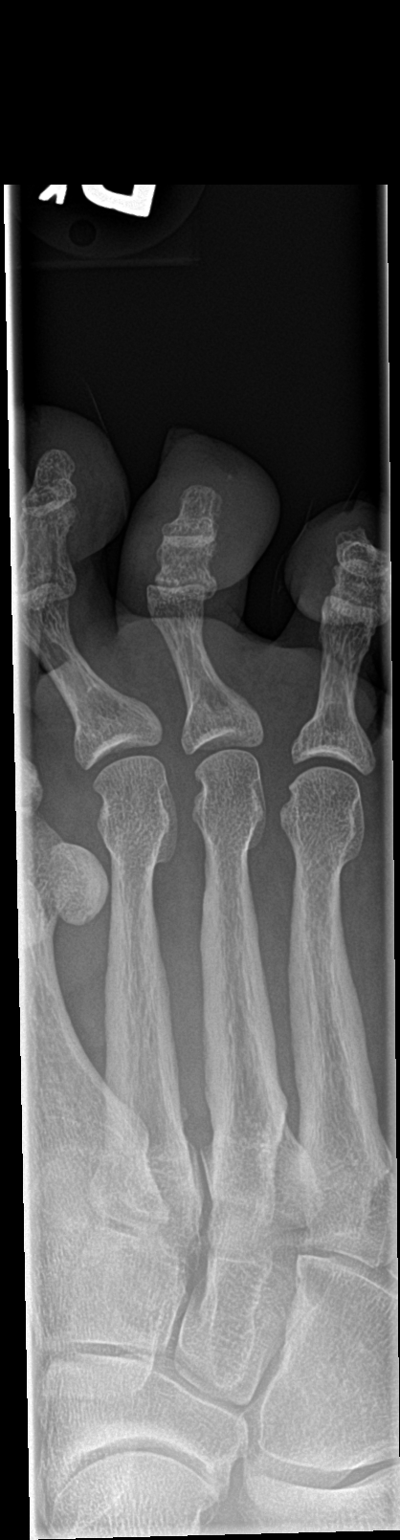

[Series 3: toe lat · 0.14mm/px · 2 of 2 slices shown]
[im 1/2]
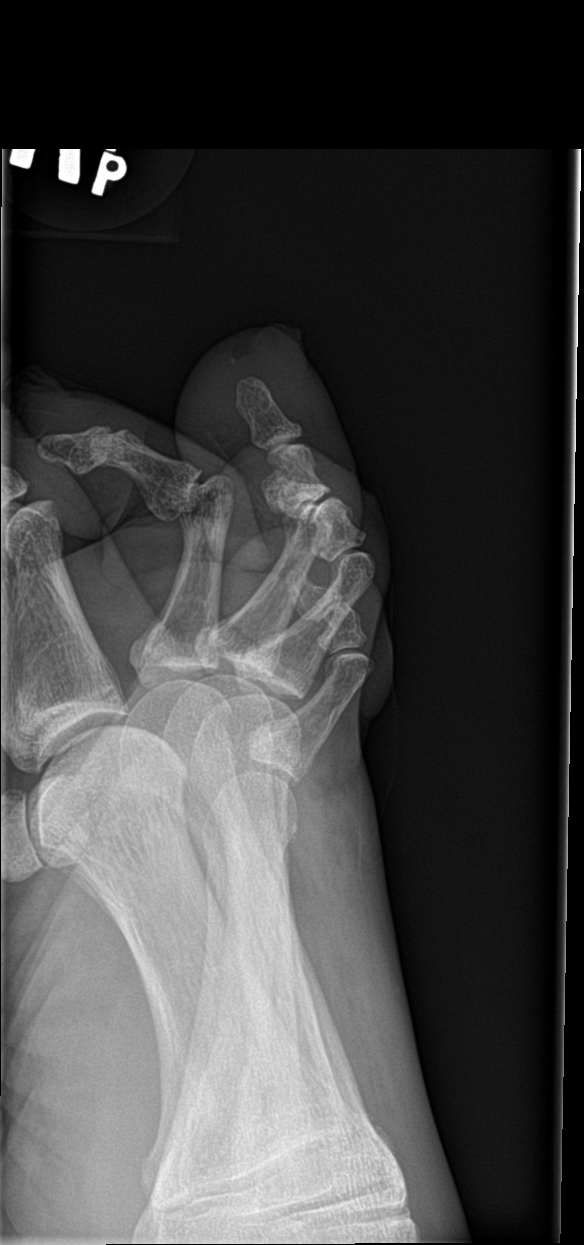
[im 2/2]
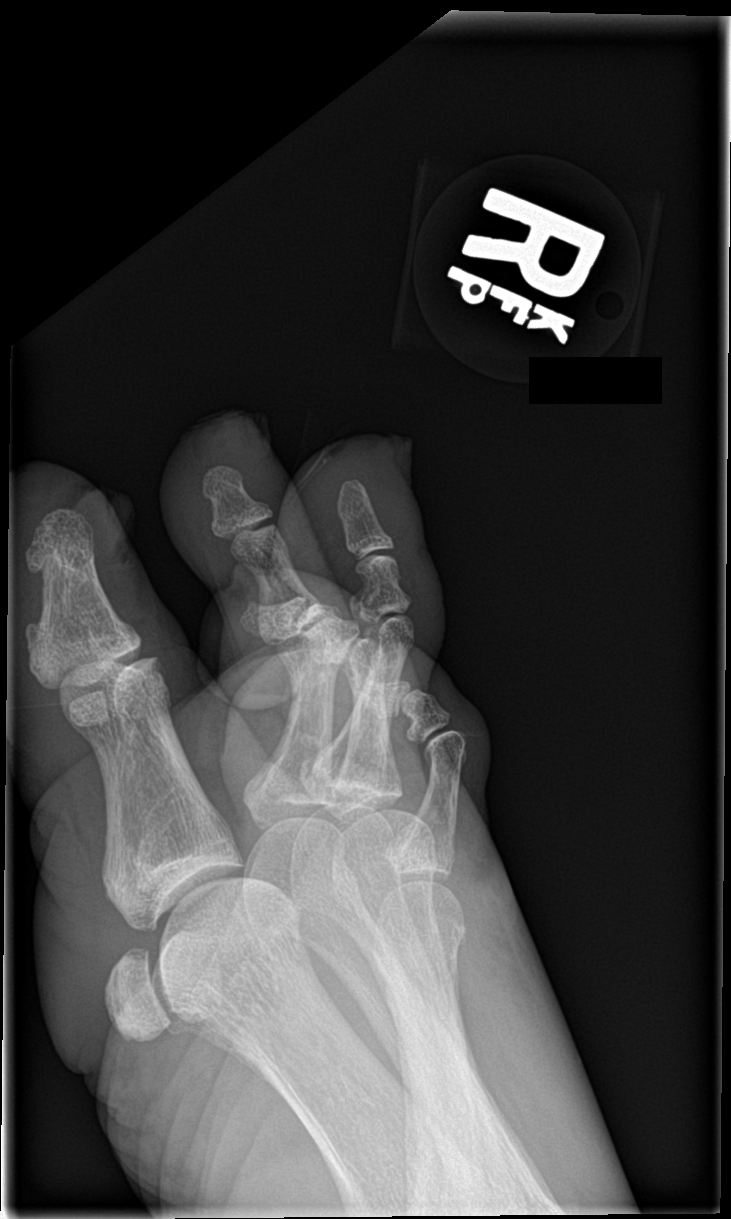

[4 of 4 positions shown; findings below may reference images not displayed]

FINDINGS: Diffuse soft tissue swelling right third toe. Negative for fracture
or osteomyelitis. No arthropathy.
IMPRESSION: No acute bony abnormality.  Soft tissue swelling third toe

## 2018-07-19 ENCOUNTER — Encounter: Payer: Self-pay | Admitting: *Deleted

## 2018-07-19 ENCOUNTER — Telehealth: Payer: Self-pay | Admitting: Family Medicine

## 2018-07-19 DIAGNOSIS — N183 Chronic kidney disease, stage 3 (moderate): Secondary | ICD-10-CM | POA: Diagnosis not present

## 2018-07-19 DIAGNOSIS — I130 Hypertensive heart and chronic kidney disease with heart failure and stage 1 through stage 4 chronic kidney disease, or unspecified chronic kidney disease: Secondary | ICD-10-CM | POA: Diagnosis not present

## 2018-07-19 DIAGNOSIS — Z4781 Encounter for orthopedic aftercare following surgical amputation: Secondary | ICD-10-CM | POA: Diagnosis not present

## 2018-07-19 DIAGNOSIS — I69018 Other symptoms and signs involving cognitive functions following nontraumatic subarachnoid hemorrhage: Secondary | ICD-10-CM | POA: Diagnosis not present

## 2018-07-19 DIAGNOSIS — E1151 Type 2 diabetes mellitus with diabetic peripheral angiopathy without gangrene: Secondary | ICD-10-CM | POA: Diagnosis not present

## 2018-07-19 DIAGNOSIS — M19042 Primary osteoarthritis, left hand: Secondary | ICD-10-CM | POA: Diagnosis not present

## 2018-07-19 DIAGNOSIS — I5032 Chronic diastolic (congestive) heart failure: Secondary | ICD-10-CM | POA: Diagnosis not present

## 2018-07-19 DIAGNOSIS — E1122 Type 2 diabetes mellitus with diabetic chronic kidney disease: Secondary | ICD-10-CM | POA: Diagnosis not present

## 2018-07-19 DIAGNOSIS — M19041 Primary osteoarthritis, right hand: Secondary | ICD-10-CM | POA: Diagnosis not present

## 2018-07-19 NOTE — Telephone Encounter (Signed)
'  Junie Panning', SLP with [facility] calling to report pts BP 166/88, prior to taking am meds.  States is taking meds now. Pt is asymptomatic.

## 2018-07-19 NOTE — Telephone Encounter (Signed)
This encounter was created in error - please disregard.

## 2018-07-20 ENCOUNTER — Other Ambulatory Visit: Payer: Self-pay | Admitting: Endocrinology

## 2018-07-20 ENCOUNTER — Ambulatory Visit (INDEPENDENT_AMBULATORY_CARE_PROVIDER_SITE_OTHER): Payer: Medicare PPO | Admitting: Psychology

## 2018-07-20 ENCOUNTER — Other Ambulatory Visit: Payer: Self-pay

## 2018-07-20 DIAGNOSIS — F064 Anxiety disorder due to known physiological condition: Secondary | ICD-10-CM

## 2018-07-24 ENCOUNTER — Telehealth: Payer: Self-pay | Admitting: Family Medicine

## 2018-07-24 DIAGNOSIS — M19042 Primary osteoarthritis, left hand: Secondary | ICD-10-CM | POA: Diagnosis not present

## 2018-07-24 DIAGNOSIS — Z4781 Encounter for orthopedic aftercare following surgical amputation: Secondary | ICD-10-CM | POA: Diagnosis not present

## 2018-07-24 DIAGNOSIS — I130 Hypertensive heart and chronic kidney disease with heart failure and stage 1 through stage 4 chronic kidney disease, or unspecified chronic kidney disease: Secondary | ICD-10-CM | POA: Diagnosis not present

## 2018-07-24 DIAGNOSIS — I5032 Chronic diastolic (congestive) heart failure: Secondary | ICD-10-CM | POA: Diagnosis not present

## 2018-07-24 DIAGNOSIS — E1151 Type 2 diabetes mellitus with diabetic peripheral angiopathy without gangrene: Secondary | ICD-10-CM | POA: Diagnosis not present

## 2018-07-24 DIAGNOSIS — N183 Chronic kidney disease, stage 3 (moderate): Secondary | ICD-10-CM | POA: Diagnosis not present

## 2018-07-24 DIAGNOSIS — I69018 Other symptoms and signs involving cognitive functions following nontraumatic subarachnoid hemorrhage: Secondary | ICD-10-CM | POA: Diagnosis not present

## 2018-07-24 DIAGNOSIS — M19041 Primary osteoarthritis, right hand: Secondary | ICD-10-CM | POA: Diagnosis not present

## 2018-07-24 DIAGNOSIS — E1122 Type 2 diabetes mellitus with diabetic chronic kidney disease: Secondary | ICD-10-CM | POA: Diagnosis not present

## 2018-07-24 NOTE — Telephone Encounter (Signed)
Noted provider of results she verbalized understanding.

## 2018-07-24 NOTE — Telephone Encounter (Signed)
Copied from [place] [phone]. Topic: General - Other >> Jul 24, 2018 10:37 AM Carolyn Stare wrote:  Judith [name] with [facility] was out to see pt today and she call to say pt bp was 164/84    [phone]

## 2018-07-26 ENCOUNTER — Ambulatory Visit: Payer: Medicare PPO | Admitting: Gastroenterology

## 2018-07-27 DIAGNOSIS — I69018 Other symptoms and signs involving cognitive functions following nontraumatic subarachnoid hemorrhage: Secondary | ICD-10-CM | POA: Diagnosis not present

## 2018-07-27 DIAGNOSIS — Z4781 Encounter for orthopedic aftercare following surgical amputation: Secondary | ICD-10-CM | POA: Diagnosis not present

## 2018-07-27 DIAGNOSIS — I130 Hypertensive heart and chronic kidney disease with heart failure and stage 1 through stage 4 chronic kidney disease, or unspecified chronic kidney disease: Secondary | ICD-10-CM | POA: Diagnosis not present

## 2018-07-27 DIAGNOSIS — N183 Chronic kidney disease, stage 3 (moderate): Secondary | ICD-10-CM | POA: Diagnosis not present

## 2018-07-27 DIAGNOSIS — E1122 Type 2 diabetes mellitus with diabetic chronic kidney disease: Secondary | ICD-10-CM | POA: Diagnosis not present

## 2018-07-27 DIAGNOSIS — I5032 Chronic diastolic (congestive) heart failure: Secondary | ICD-10-CM | POA: Diagnosis not present

## 2018-07-27 DIAGNOSIS — M19042 Primary osteoarthritis, left hand: Secondary | ICD-10-CM | POA: Diagnosis not present

## 2018-07-27 DIAGNOSIS — E1151 Type 2 diabetes mellitus with diabetic peripheral angiopathy without gangrene: Secondary | ICD-10-CM | POA: Diagnosis not present

## 2018-07-27 DIAGNOSIS — M19041 Primary osteoarthritis, right hand: Secondary | ICD-10-CM | POA: Diagnosis not present

## 2018-07-31 DIAGNOSIS — Z89511 Acquired absence of right leg below knee: Secondary | ICD-10-CM | POA: Diagnosis not present

## 2018-08-01 ENCOUNTER — Other Ambulatory Visit: Payer: Self-pay | Admitting: Family Medicine

## 2018-08-01 MED ORDER — GABAPENTIN 300 MG PO CAPS: ORAL_CAPSULE | ORAL | 0 refills | Status: DC

## 2018-08-04 ENCOUNTER — Other Ambulatory Visit: Payer: Self-pay

## 2018-08-07 ENCOUNTER — Other Ambulatory Visit: Payer: Self-pay

## 2018-08-07 ENCOUNTER — Ambulatory Visit (INDEPENDENT_AMBULATORY_CARE_PROVIDER_SITE_OTHER): Payer: Medicare PPO | Admitting: Endocrinology

## 2018-08-07 DIAGNOSIS — E1151 Type 2 diabetes mellitus with diabetic peripheral angiopathy without gangrene: Secondary | ICD-10-CM

## 2018-08-07 DIAGNOSIS — E0590 Thyrotoxicosis, unspecified without thyrotoxic crisis or storm: Secondary | ICD-10-CM | POA: Diagnosis not present

## 2018-08-07 MED ORDER — TRUE METRIX METER DEVI: 1.0000 | Freq: Once | 0 refills | Status: AC

## 2018-08-07 NOTE — Patient Instructions (Addendum)
check your blood sugar twice a day.  vary the time of day when you check, between before the 3 meals, and at bedtime.  also check if you have symptoms of your blood sugar being too high or too low.  please keep a record of the readings and bring it to your next appointment here (or you can bring the meter itself).  You can write it on any piece of paper.  please call us sooner if your blood sugar goes below 70, or if you have a lot of readings over 200. Please continue the same medications. Please come back for a follow-up appointment in 3 months.      

## 2018-08-07 NOTE — Progress Notes (Addendum)
Subjective:    Patient ID: Judith [name], female    DOB: Mar 27, 1951, 68 y.o.   MRN: [id number]  HPI  telehealth visit today via doxy video visit.  Alternatives to telehealth are presented to this patient, and the patient agrees to the telehealth visit. Pt is advised of the cost of the visit, and agrees to this, also.   Patient is at home, and I am at the office.   Pt returns for f/u of hyperthyroidism (pt says she took synthroid for a brief time in the 1990's, in an attempt to shrink a goiter; slightly suppressed TSH was first noted in late 2016, when she was in the hospital for new-onset AF; she converted back to SR; nuc med scan showed heterogeneous uptake; neck scar is from C-spine procedure). she takes tapazole as rx'ed.  pt states she feels well in general.   Pt also ret for f/u of DM: DM type: Insulin-requiring type 2. Dx'ed: 2993 Complications: polyneuropathy, renal failure, retinopathy, right BKA, and CAD.   Therapy: insulin since 1997, and metformin.   GDM: never.  DKA: never. Severe hypoglycemia: never.   Pancreatitis: never.   Other: she said reg and novolog both caused sob, so she was changed to qd insulin.   Interval history: pt says she needs a new meter.   She was recently in the hospital for moderate weakness throughout the body, and assoc n/v.  She was found to have hypomagnesemia.  She had labs then.  She feels better now.  Past Medical History:  Diagnosis Date   Abnormal EKG 07/31/2013   Arthritis    "hands" (03/06/2015)   Asthma    Carpal tunnel syndrome, bilateral    Chronic kidney disease    Complication of anesthesia    slow to wake up. Pt sts she woke up during surgery many years ago.   Coronary artery disease     2 v CAD with CTO of the RCA and high grade bifurcational LCx/OM stenosis. S/P PCI DES x 2 to the LCx/OM.   Diabetic peripheral neuropathy ([place]) "since 1996"   GERD (gastroesophageal reflux disease)    Goiter    Headache    migraines prior to menopause   History of shingles 06/01/2013   Hyperlipidemia LDL goal <70 10/13/2015   Hypertension    Hyperthyroidism    Osteomyelitis of foot (HCC)    Right   PAF (paroxysmal atrial fibrillation) ([place]) 04/29/2015   CHADS2VASC score of 5 now on Apixaban   Pneumonia ~ 1976   Sleep apnea    Bipap   Tremors of nervous system    Type II diabetes mellitus (HCC)    insulin dependent    Past Surgical History:  Procedure Laterality Date   ABDOMINAL HYSTERECTOMY  42   age 55; CERVICAL DYSPLASIA; ovaries intact.    AMPUTATION Right 01/23/2016   Procedure: Right 3rd Ray Amputation;  Surgeon: [name], MD;  Location: [facility];  Service: Orthopedics;  Laterality: Right;   AMPUTATION Right 02/13/2016   Procedure: Right Transmetatarsal Amputation;  Surgeon: [name], MD;  Location: [facility];  Service: Orthopedics;  Laterality: Right;   AMPUTATION Right 04/15/2018   Procedure: AMPUTATION BELOW KNEE;  Surgeon: [name], MD;  Location: [facility];  Service: Orthopedics;  Laterality: Right;   BIOPSY  04/16/2018   Procedure: BIOPSY;  Surgeon: Irene Shipper, MD;  Location[facility] END[facility]COPY;  Service: Endoscopy;;   CARDIAC CATHETERIZATION N/A 02/27/2015   Procedure: Left Heart Cath  and Coronary Angiography;  Surgeon: [name], MD; LAD 40%, mCFX 80%, OM 70%, RCA 100% calcified        CARDIAC CATHETERIZATION N/A 03/06/2015   Procedure: Coronary Stent Intervention;  Surgeon: [name], MD;  Location: [facility];  Service: Cardiovascular;  Laterality: N/A;  Mid CX 3.50x12 promus DES w/ 0% resdual and Prox OM1 2.50x20 promus DES w/ 20% residual   CARDIOVERSION     CARDIOVERSION N/A 05/22/2018   Procedure: CARDIOVERSION;  Surgeon: Skeet Latch, MD;  Location: [place];  Service: Cardiovascular;  Laterality: N/A;   CARPAL TUNNEL RELEASE Right Nov 2015   CARPAL TUNNEL RELEASE Right 1992; 05/2014   [name]; [place];  1984   ESOPHAGOGASTRODUODENOSCOPY (EGD) WITH PROPOFOL N/A 04/16/2018   Procedure: ESOPHAGOGASTRODUODENOSCOPY (EGD) WITH PROPOFOL;  Surgeon: Irene Shipper, MD;  Location[facility] ENDOSCOPY;  Service: Endoscopy;  Laterality: N/A;   FOOT NEUROMA SURGERY Bilateral 2000   I&D EXTREMITY Left 01/06/2018   Procedure: DEBRIDEMENT ULCER LEFT FOOT;  Surgeon: [name], MD;  Location: [facility];  Service: Orthopedics;  Laterality: Left;   KNEE ARTHROSCOPY Right ~ 2003   "meniscus repair"   SHOULDER OPEN ROTATOR CUFF REPAIR Right 1996; 1998   "w/fracture repair"   THYROID SURGERY  2000   "removed lots of nodules"   TONSILLECTOMY  1976    Social History   Socioeconomic History   Marital status: Single    Spouse name: Not on file   Number of children: 2   Years of education: Masters   Highest education level: Not on file  Occupational History   Occupation: Landscape architect  Social Needs   Financial resource strain: Somewhat hard   Food insecurity:    Worry: Sometimes true    Inability: Sometimes true   Transportation needs:    Medical: No    Non-medical: No  Tobacco Use   Smoking status: Former Smoker    Packs/day: 0.00    Years: 41.00    Pack years: 0.00    Types: Cigarettes    Last attempt to quit: 03/05/2015    Years since quitting: 3.4   Smokeless tobacco: Never Used   Tobacco comment: 04/29/2015 "quit smoking cigarettes 02/27/2015"  Substance and Sexual Activity   Alcohol use: No   Drug use: No   Sexual activity: Not Currently    Birth control/protection: Post-menopausal, Surgical  Lifestyle   Physical activity:    Days per week: 0 days    Minutes per session: 0 min   Stress: Very much  Relationships   Social connections:    Talks on phone: More than three times a week    Gets together: More than three times a week    Attends religious service: More than 4 times per year    Active member of club or organization: No    Attends meetings of clubs or  organizations: Never    Relationship status: Never married   Intimate partner violence:    Fear of current or ex partner: No    Emotionally abused: No    Physically abused: No    Forced sexual activity: No  Other Topics Concern   Not on file  Social History Narrative   Marital status: divorced since 2011 after 14 years of marriage; not dating      Children: 2 children; (1982, 1984); 3 grandchildren (86, 2,1)      Employment: [employer]; 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  Lives with sister in [place].      Tobacco: 1 ppd x 41 years - quit 2016      Alcohol: none      Drugs: none      Exercise:  Walking in neighborhood; physical job.   Right-handed.   2 cups caffeine daily.    Current Outpatient Medications on File Prior to Visit  Medication Sig Dispense Refill   acetaminophen (TYLENOL) 325 MG tablet Take 1-2 tablets (325-650 mg total) by mouth every 6 (six) hours as needed for mild pain (pain score 1-3 or temp > 100.5).     albuterol (PROVENTIL HFA) 108 (90 Base) MCG/ACT inhaler Inhale 1-2 puffs into the lungs every 6 (six) hours as needed for wheezing or shortness of breath. 1 Inhaler 0   amiodarone (PACERONE) 200 MG tablet Take 1 tablet (200 mg total) by mouth daily. 30 tablet 6   apixaban (ELIQUIS) 5 MG TABS tablet Take 1 tablet (5 mg total) by mouth 2 (two) times daily. 180 tablet 3   atorvastatin (LIPITOR) 20 MG tablet Take 1 tablet (20 mg total) by mouth daily. 90 tablet 3   buPROPion (WELLBUTRIN SR) 150 MG 12 hr tablet Take 1 tablet (150 mg total) by mouth 2 (two) times daily. 60 tablet 1   diltiazem (CARDIZEM CD) 120 MG 24 hr capsule Take 1 capsule (120 mg total) by mouth daily. 90 capsule 1   famotidine (PEPCID) 20 MG tablet Take 1 tablet (20 mg total) by mouth 2 (two) times daily. 60 tablet 3   fenofibrate 160 MG tablet Take 1 tablet (160 mg total) by mouth daily. 30 tablet 1   gabapentin (NEURONTIN) 300 MG capsule TAKE 3 CAPSULES  BY MOUTH IN THE MORNING AND TAKE 3 CAPSULES BY MOUTH AT BEDTIME 180 capsule 0   glucose blood (TRUE METRIX BLOOD GLUCOSE TEST) test strip Used to check blood sugars 2x daily. 100 each 12   insulin degludec (TRESIBA FLEXTOUCH) 100 UNIT/ML SOPN FlexTouch Pen Inject 0.15 mLs (15 Units total) into the skin daily. 15 mL 0   Magnesium 400 MG TABS Take 400 mg by mouth 2 (two) times a week.     metFORMIN (GLUCOPHAGE) 1000 MG tablet Take 1 tablet (1,000 mg total) by mouth daily with breakfast. 30 tablet 11   methimazole (TAPAZOLE) 10 MG tablet Take 1 tablet (10 mg total) by mouth daily. 30 tablet 11   mupirocin ointment (BACTROBAN) 2 % Apply 1 application topically 3 (three) times daily. 22 g 1   traMADol (ULTRAM) 50 MG tablet Take 50 mg by mouth every 6 (six) hours as needed.     No current facility-administered medications on file prior to visit.     Allergies  Allergen Reactions   Contrast Media [Iodinated Diagnostic Agents] Hives and Other (See Comments)    Spoke to patient, Iodine allergy is really IV contrast allergy.    Novolog [Insulin Aspart] Shortness Of Breath and Other (See Comments)    "breathing problems"   Codeine Nausea And Vomiting and Other (See Comments)    HIGH DOSES-SEVERE VOMITING   Iodine Other (See Comments)    MUST HAVE BENADRYL PRIOR TO PROCEDURE AND RIGHT BEFORE TREATMENT TO COUNTERACT REACTION-BLISTERING REACTION DERMATOLOGICAL   Penicillins Itching, Rash and Other (See Comments)    Has patient had a PCN reaction causing immediate rash, facial/tongue/throat swelling, SOB or lightheadedness with hypotension: no Has patient had a PCN reaction causing severe rash involving mucus membranes or skin necrosis: No Has patient  had a PCN reaction that required hospitalization No Has patient had a PCN reaction occurring within the last 10 years: No If all of the above answers are "NO", then may proceed with Cephalosporin use.  CHEST SIZED RASH AND ITCHING    Ace  Inhibitors Cough   Demerol [Meperidine] Nausea And Vomiting   Dilaudid [Hydromorphone Hcl] Other (See Comments)    HEADACHE    Neosporin [Neomycin-Bacitracin Zn-Polymyx] Itching, Rash and Other (See Comments)    MAKES REACTIONS WORSE WHEN USING AS PROPHYLACTIC   Percocet [Oxycodone-Acetaminophen] Rash   Tape Itching and Rash    Family History  Problem Relation Age of Onset   Cancer Mother 79       bronchial cancer   Breast cancer Mother    Lung cancer Mother    Hypertension Father    COPD Father    Heart disease Father 62       CAD with cardiac stenting   Heart attack Father    Parkinson's disease Father    Allergies Sister    Breast cancer Maternal Grandmother    Emphysema Maternal Grandfather    Leukemia Paternal Grandmother    Emphysema Paternal Grandfather    Thyroid disease Neg Hx    Review of Systems Fever is resolved.  She denies hypoglycemia.      Objective:   Physical Exam   outside test results are reviewed: A1c=7.4% TSH=1.4 WBC=6900    Assessment & Plan:  Insulin-requiring type 2 DM: this is the best control this pt should aim for, given this regimen, which does match insulin to her changing needs throughout the day Hyperthyroidism: well-controlled Fever, not related to methimazole.  Resolved.    Patient Instructions  check your blood sugar twice a day.  vary the time of day when you check, between before the 3 meals, and at bedtime.  also check if you have symptoms of your blood sugar being too high or too low.  please keep a record of the readings and bring it to your next appointment here (or you can bring the meter itself).  You can write it on any piece of paper.  please call us sooner if your blood sugar goes below 70, or if you have a lot of readings over 200.  Please continue the same medications.  Please come back for a follow-up appointment in 3 months.

## 2018-08-08 ENCOUNTER — Ambulatory Visit (INDEPENDENT_AMBULATORY_CARE_PROVIDER_SITE_OTHER): Payer: Medicaid Other | Admitting: Psychology

## 2018-08-08 DIAGNOSIS — F064 Anxiety disorder due to known physiological condition: Secondary | ICD-10-CM | POA: Diagnosis not present

## 2018-08-10 ENCOUNTER — Other Ambulatory Visit: Payer: Self-pay | Admitting: Endocrinology

## 2018-08-16 DIAGNOSIS — G4733 Obstructive sleep apnea (adult) (pediatric): Secondary | ICD-10-CM | POA: Diagnosis not present

## 2018-08-17 ENCOUNTER — Encounter: Payer: Self-pay | Admitting: Physical Therapy

## 2018-08-17 ENCOUNTER — Telehealth: Payer: Self-pay

## 2018-08-17 ENCOUNTER — Other Ambulatory Visit: Payer: Self-pay

## 2018-08-17 ENCOUNTER — Ambulatory Visit: Payer: Medicare PPO | Attending: Orthopedic Surgery | Admitting: Physical Therapy

## 2018-08-17 DIAGNOSIS — M79661 Pain in right lower leg: Secondary | ICD-10-CM | POA: Diagnosis not present

## 2018-08-17 DIAGNOSIS — M6281 Muscle weakness (generalized): Secondary | ICD-10-CM | POA: Diagnosis not present

## 2018-08-17 DIAGNOSIS — R2689 Other abnormalities of gait and mobility: Secondary | ICD-10-CM | POA: Insufficient documentation

## 2018-08-17 DIAGNOSIS — R293 Abnormal posture: Secondary | ICD-10-CM | POA: Insufficient documentation

## 2018-08-17 DIAGNOSIS — R2681 Unsteadiness on feet: Secondary | ICD-10-CM | POA: Insufficient documentation

## 2018-08-17 DIAGNOSIS — R296 Repeated falls: Secondary | ICD-10-CM | POA: Diagnosis not present

## 2018-08-17 NOTE — Telephone Encounter (Signed)
Called patient and left non detailed message to call office back. Need to make up coming office visit a virtual visit.

## 2018-08-18 ENCOUNTER — Telehealth: Payer: Self-pay

## 2018-08-18 NOTE — Therapy (Signed)
[employer] [address] [place], [place], [address] Phone: [phone]   Fax:  [phone]  Physical Therapy Evaluation  Patient Details  Name: Judith [name] MRN: [id number] Date of Birth: 05-23-1950 Referring Provider (PT): [name], MD   Encounter Date: 08/17/2018  PT End of Session - 08/18/18 0548    Visit Number  1    Number of Visits  25    Date for PT Re-Evaluation  11/15/18    Authorization Type  [employer] & Medicaid    PT Start Time  1009    PT Stop Time  1106    PT Time Calculation (min)  57 min    Equipment Utilized During Treatment  Gait belt    Activity Tolerance  Patient tolerated treatment well    Behavior During Therapy  WFL for tasks assessed/performed       Past Medical History:  Diagnosis Date  . Abnormal EKG 07/31/2013  . Arthritis    "hands" (03/06/2015)  . Asthma   . Carpal tunnel syndrome, bilateral   . Chronic kidney disease   . Complication of anesthesia    slow to wake up. Pt sts she woke up during surgery many years ago.  . Coronary artery disease     2 v CAD with CTO of the RCA and high grade bifurcational LCx/OM stenosis. S/P PCI DES x 2 to the LCx/OM.  Marland Kitchen Diabetic peripheral neuropathy ([place]) "since 1996"  . GERD (gastroesophageal reflux disease)   . Goiter   . Headache    migraines prior to menopause  . History of shingles 06/01/2013  . Hyperlipidemia LDL goal <70 10/13/2015  . Hypertension   . Hyperthyroidism   . Osteomyelitis of foot (HCC)    Right  . PAF (paroxysmal atrial fibrillation) ([place]) 04/29/2015   CHADS2VASC score of 5 now on Apixaban  . Pneumonia ~ 1976  . Sleep apnea    Bipap  . Tremors of nervous system   . Type II diabetes mellitus (HCC)    insulin dependent    Past Surgical History:  Procedure Laterality Date  . ABDOMINAL HYSTERECTOMY  1988   age 77; CERVICAL DYSPLASIA; ovaries intact.   . AMPUTATION Right 01/23/2016   Procedure: Right 3rd  Ray Amputation;  Surgeon: [name], MD;  Location: [facility];  Service: Orthopedics;  Laterality: Right;  . AMPUTATION Right 02/13/2016   Procedure: Right Transmetatarsal Amputation;  Surgeon: [name], MD;  Location: [facility];  Service: Orthopedics;  Laterality: Right;  . AMPUTATION Right 04/15/2018   Procedure: AMPUTATION BELOW KNEE;  Surgeon: [name], MD;  Location: [facility];  Service: Orthopedics;  Laterality: Right;  . BIOPSY  04/16/2018   Procedure: BIOPSY;  Surgeon: Irene Shipper, MD;  Location: [facility] ENDOSCOPY;  Service: Endoscopy;;  . CARDIAC CATHETERIZATION N/A 02/27/2015   Procedure: Left Heart Cath and Coronary Angiography;  Surgeon: [name], MD; LAD 40%, mCFX 80%, OM 70%, RCA 100% calcified       . CARDIAC CATHETERIZATION N/A 03/06/2015   Procedure: Coronary Stent Intervention;  Surgeon: [name], MD;  Location: [facility];  Service: Cardiovascular;  Laterality: N/A;  Mid CX 3.50x12 promus DES w/ 0% resdual and Prox OM1 2.50x20 promus DES w/ 20% residual  . CARDIOVERSION    . CARDIOVERSION N/A 05/22/2018   Procedure: CARDIOVERSION;  Surgeon: Skeet Latch, MD;  Location: [facility];  Service: Cardiovascular;  Laterality: N/A;  . CARPAL TUNNEL RELEASE Right Nov  2015  . CARPAL TUNNEL RELEASE Right 1992; 05/2014   [name]; [place]  . CESAREAN SECTION  1982; 1984  . ESOPHAGOGASTRODUODENOSCOPY (EGD) WITH PROPOFOL N/A 04/16/2018   Procedure: ESOPHAGOGASTRODUODENOSCOPY (EGD) WITH PROPOFOL;  Surgeon: Irene Shipper, MD;  Location: [facility] ENDOSCOPY;  Service: Endoscopy;  Laterality: N/A;  . FOOT NEUROMA SURGERY Bilateral 2000  . I&D EXTREMITY Left 01/06/2018   Procedure: DEBRIDEMENT ULCER LEFT FOOT;  Surgeon: [name], MD;  Location: [facility];  Service: Orthopedics;  Laterality: Left;  . KNEE ARTHROSCOPY Right ~ 2003   "meniscus repair"  . SHOULDER OPEN ROTATOR CUFF REPAIR Right 1996; 1998   "w/fracture repair"  . THYROID SURGERY  2000   "removed lots of  nodules"  . TONSILLECTOMY  1976    There were no vitals filed for this visit.   Subjective Assessment - 08/17/18 1015    Subjective  This 68yo female was referred on 08/09/2018 for Amputee PT Evaluation by [name], MD. She underwent a right Transtibial Amputation on 04/15/2018 due to wounds with Transmetatarsal Amputation in 2017. She was hospitalized 05/14/2018 - 05/23/2018 with altered mental status encephalopathy, V-Tach & new onset seizures. She recieved his first prosthesis on 07/31/2018.      Pertinent History  R TTA, asthma, CAD, IDDM2, neuropathy, shingles, HTN, Left non-traumatic intracerebral hemorrhage, CKD stage 3, right shoulder impingement syndrome    Limitations  Lifting;Standing;Walking;House hold activities    Patient Stated Goals  To use prosthesis to walk in community & home, gardening, [place]    Currently in Pain?  No/denies         [facility] PT Assessment - 08/17/18 1015      Assessment   Medical Diagnosis  Right Transtibial Amputation    Referring Provider (PT)  [name], MD    Onset Date/Surgical Date  --   prosthesis delivery   Hand Dominance  Right    Prior Therapy  Inpatient Rehab & HHPT      Precautions   Precautions  Fall      Balance Screen   Has the patient fallen in the past 6 months  Yes    How many times?  3-4   had impaired balance prior to amputation   Has the patient had a decrease in activity level because of a fear of falling?   Yes    Is the patient reluctant to leave their home because of a fear of falling?   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  BLEs grossly [facility]      Strength   Overall Strength  Deficits    Overall Strength Comments  right shoulder 3-/5 due to RCT    Strength Assessment Site  Hip;Knee;Ankle    Right Hip Flexion  4/5    Right Hip Extension  3/5   tested grossly in sitting   Right Hip ABduction  4/5   tested grossly in sitting   Left Hip Flexion  4/5    Left Hip Extension  3/5   tested grossly in sitting   Left Hip ABduction  4/5   tested grossly in sitting   Right/Left Knee  Right;Left    Right Knee Flexion  3+/5   tested grossly in sitting   Right Knee Extension  4/5    Left Knee Flexion  4/5   tested grossly in sitting   Left Knee Extension  4/5    Left Ankle Dorsiflexion  4/5      Transfers   Transfers  Sit to Stand;Stand to Sit    Sit to Stand  5: Supervision;With upper extremity assist;From elevated surface;With armrests;From chair/3-in-1   requires RW support to stabilize   Stand to Sit  5: Supervision;With upper extremity assist;With armrests;To chair/3-in-1   with RW for stability     Ambulation/Gait   Ambulation/Gait  Yes    Ambulation/Gait Assistance  4: Min assist    Ambulation/Gait Assistance Details  excessive UE weight bearing on RW, right LE external rotation  with knee flexion moment    Ambulation Distance (Feet)  80 Feet    Assistive device  Rolling walker;Prosthesis    Gait Pattern  Step-to pattern;Decreased step length - left;Decreased stance time - right;Decreased stride length;Decreased hip/knee flexion - right;Decreased weight shift to right;Right hip hike;Right circumduction;Right flexed knee in stance;Antalgic;Lateral hip instability;Trunk rotated posteriorly on right;Trunk flexed;Narrow base of support;Poor foot clearance - right    Ambulation Surface  Level;Indoor    Gait velocity  0.94 ft/sec      Standardized Balance Assessment   Standardized Balance Assessment  Berg Balance Test      Berg Balance Test   Sit to Stand  Needs minimal aid to stand or to stabilize    Standing Unsupported  Needs several tries to stand 30 seconds unsupported    Sitting with Back Unsupported but Feet Supported on Floor or Stool  Able to sit safely and securely 2 minutes    Stand to Sit  Controls descent by using hands    Transfers  Able to transfer safely, definite need of hands    Standing Unsupported with Eyes Closed  Unable to keep eyes closed 3 seconds but stays steady    Standing Unsupported with Feet Together  Needs help to attain position and unable to hold for 15 seconds    From Standing, Reach Forward with Outstretched Arm  Reaches forward but needs supervision    From Standing Position, Pick up Object from Floor  Unable to try/needs assist to keep balance    From Standing Position, Turn to Look Behind Over each Shoulder  Needs supervision when turning    Turn 360 Degrees  Needs assistance while turning    Standing Unsupported, Alternately Place Feet  on Step/Stool  Needs assistance to keep from falling or unable to try    Standing Unsupported, One Foot in [employer] balance while stepping or standing    Standing on One Leg  Unable to try or needs assist to prevent fall    Total Score  15      Prosthetics Assessment - 08/17/18 1015       Prosthetics   Prosthetic Care Dependent with  Skin check;Residual limb care;Care of non-amputated limb;Prosthetic cleaning;Ply sock cleaning;Correct ply sock adjustment;Proper wear schedule/adjustment;Proper weight-bearing schedule/adjustment    Donning prosthesis   Supervision    Doffing prosthesis   Supervision    Current prosthetic wear tolerance (days/week)   daily for 18 days    Current prosthetic wear tolerance (#hours/day)   up to 1.5 hrs    Current prosthetic weight-bearing tolerance (hours/day)   reports pain on tibial crest with 5 min standing with partial weight bearing on prosthesis with RW support    Edema  pitting edema with 7 sec refill    Residual limb condition   2 dry scabs on incision, redness on patella & medial compartment, normal temperature, minimal hair growth               Objective measurements completed on examination: See above findings.      [facility] Adult PT Treatment/Exercise - 08/17/18 1015      Prosthetics   Prosthetic Care Comments   initiate wear 2 hrs 2x/day,     Education Provided  Skin check;Residual limb care;Care of non-amputated limb;Prosthetic cleaning;Ply sock cleaning;Proper Donning;Proper Doffing;Proper wear schedule/adjustment    Person(s) Educated  Patient    Education Method  Explanation;Demonstration;Verbal cues;Tactile cues    Education Method  Verbalized understanding;Returned demonstration;Tactile cues required;Verbal cues required;Needs further instruction               PT Short Term Goals - 08/18/18 9509      PT SHORT TERM GOAL #1   Title  Patient verbalizes proper cleaning & demonstrates proper donning of prosthesis. (All STGs Target Date 09/15/2018)    Time  4    Period  Weeks    Status  New    Target Date  09/15/18      PT SHORT TERM GOAL #2   Title  Patient tolerates prosthesis wear >8hrs total /day without skin issues.     Time  4    Period  Weeks    Status  New    Target Date  09/15/18      PT SHORT TERM  GOAL #3   Title  Patient is able to stand 1 minute without UE support and reach 2" anteriorly & to knee level safely with supervision.     Time  4    Period  Weeks    Status  New    Target Date  09/15/18      PT SHORT TERM GOAL #4   Title  Patient ambulates 200' with RW & prosthesis with supervision.     Time  4    Period  Weeks    Status  New    Target Date  09/15/18      PT SHORT TERM GOAL #5   Title  Patient negotiates stairs with 2 rails, ramp & curb with RW & prosthesis with minimal assist.     Time  4    Period  Weeks    Status  New    Target Date  09/15/18  PT Long Term Goals - 08/18/18 0603      PT LONG TERM GOAL #1   Title  Patient verbalizes & demonstrates understanding of prosthetic care to enable safe use of prosthesis (All LTGs Target Date 11/15/2018)    Time  12    Period  Weeks    Status  New    Target Date  11/15/18      PT LONG TERM GOAL #2   Title  Patient tolerates prosthesis wear >90% of awake hours without skin or pain issues to enable function throughout her day.     Time  12    Period  Weeks    Status  New    Target Date  11/15/18      PT LONG TERM GOAL #3   Title  Berg Balance >/= 45/56 to indicate lower fall risk.     Time  12    Period  Weeks    Status  New    Target Date  11/15/18      PT LONG TERM GOAL #4   Title  Patient ambulates >500' outdoors with LRAD modified independent to enable safe function in community.     Time  12    Period  Weeks    Status  New    Target Date  11/15/18      PT LONG TERM GOAL #5   Title  Patient negotiates stairs, ramps & curbs with LRAD & Prosthesis modified independent to enable community access.     Time  12    Period  Weeks    Status  New    Target Date  11/15/18      PT LONG TERM GOAL #6   Title  Patient ambulates 73' around furniture with cane or less & prosthesis carrying household items modified independent for household function.     Time  12    Period  Weeks    Status  New     Target Date  11/15/18             Plan - 08/17/18 1552    Clinical Impression Statement  This 68yo female underwent a right transtibial amputation 04/15/2018, weakness from CVA 05/14/2018 and right rotator cuff tear awaiting surgery.  She recieved her first prosthesis 18 days prior to PT evaluation and is dependent in care & use. She is only tolerating wear up to 1.5hrs /day which limits function during her day. Berg balance test 15/56 indicates high fall risk & dependency in standing ADLs. Her prosthetic gait is dependent on RW & minimal assist with signficant balance issues limiting safety. Patient would benefit from skilled PT to improve function & mobility.     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   Stability/Clinical Decision Making  Evolving/Moderate complexity    Clinical Decision Making  Moderate    Rehab Potential  Good    PT Frequency  2x / week    PT Duration  12 weeks    PT Treatment/Interventions  ADLs/Self Care Home Management;Canalith Repostioning;Moist Heat;DME Instruction;Gait training;Stair training;Functional mobility training;Therapeutic activities;Therapeutic exercise;Balance training;Neuromuscular re-education;Patient/family education;Prosthetic Training;Taping;Vestibular    PT Next Visit Plan  review prosthetic care, HEP at sink, Prosthetic gait with RW  Consulted and Agree with Plan of Care  Patient       Patient will benefit from skilled therapeutic intervention in order to improve the following deficits and impairments:  Abnormal gait, Decreased activity  tolerance, Decreased balance, Decreased endurance, Decreased knowledge of use of DME, Decreased mobility, Decreased range of motion, Decreased strength, Dizziness, Increased edema, Postural dysfunction, Prosthetic Dependency, Pain  Visit Diagnosis: Pain in right lower leg  Abnormal posture  Muscle weakness (generalized)  Unsteadiness on feet  Other abnormalities of gait and mobility  Repeated falls     Problem List Patient Active Problem List   Diagnosis Date Noted  . Urinary tract infection 06/30/2018  . ICH (intracerebral hemorrhage) ([place]) 06/27/2018  . DKA (diabetic ketoacidoses) ([place]) 06/26/2018  . V-tach ([place]) 05/14/2018  . Seizure ([place]) 05/14/2018  . Slow transit constipation   . Fall   . Labile blood glucose   . Labile blood pressure   . Chronic combined systolic and diastolic congestive heart failure ([place])   . Chronic diastolic congestive heart failure ([place])   . CKD (chronic kidney disease), stage III ([place])   . Type 2 diabetes mellitus with peripheral neuropathy (HCC)   . Amputation of right lower extremity below knee ([place]) 04/20/2018  . Unilateral traumatic amputation of right leg below knee with complication, initial encounter ([place])   . Post-operative pain   . PAF (paroxysmal atrial fibrillation) ([place])   . Duodenal ulcer   . Atrial fibrillation with RVR ([place]) 04/14/2018  . Foot osteomyelitis, right ([place]) 04/14/2018  . Cutaneous abscess of left foot   . Acute respiratory failure with hypoxia ([place])   . Cellulitis 01/02/2018  . LGI bleed   . Acute blood loss anemia   . Wide-complex tachycardia ([place]) 07/04/2017  . Type II diabetes mellitus, uncontrolled ([place]) 07/04/2017  . Peripheral neuropathy 07/04/2017  . H/O hyperthyroidism 07/04/2017  . Atrial fibrillation with rapid ventricular response ([place]) 08/06/2016  . S/P transmetatarsal amputation of foot, right ([place]) 04/19/2016  . Obstructive sleep apnea 11/26/2015  . Bilateral carpal tunnel syndrome 11/26/2015   . Hypomagnesemia 11/16/2015  . Hyperlipidemia LDL goal <70 10/13/2015  . Abnormality of gait 09/02/2015  . Memory loss 08/12/2015  . Diabetic peripheral neuropathy ([place]) 08/12/2015  . Vitamin D deficiency 08/12/2015  . Hyperthyroidism 04/30/2015  . Heme positive stool   . Persistent atrial fibrillation 04/29/2015  . Coronary artery disease with stable angina pectoris ([place]) 03/29/2015  . Abnormal nuclear stress test   . History of goiter 09/28/2014  . GERD (gastroesophageal reflux disease) 07/31/2013  . Depression with anxiety 06/01/2013  . DM (diabetes mellitus), type 2 with peripheral vascular complications ([place]) 95/28/4132  . HTN (hypertension) 01/30/2013    Jamey Reas PT, DPT 08/18/2018, 6:15 AM  [facility] [address] [place], [place], [address] Phone: [phone]   Fax:  [phone]  Name: Judith [name] MRN: [id number] Date of Birth: [birth year]

## 2018-08-18 NOTE — Telephone Encounter (Signed)
Left message about upcoming appoint to see if they can do a video or telephone call.

## 2018-08-20 NOTE — Progress Notes (Deleted)
Virtual Visit via Video Note   This visit type was conducted due to national recommendations for restrictions regarding the COVID-19 Pandemic (e.g. social distancing) in an effort to limit this patient's exposure and mitigate transmission in our community.  Due to her co-morbid illnesses, this patient is at least at moderate risk for complications without adequate follow up.  This format is felt to be most appropriate for this patient at this time.  All issues noted in this document were discussed and addressed.  A limited physical exam was performed with this format.  Please refer to the patient's chart for her consent to telehealth for [facility].  Evaluation Performed:  Follow-up visit  This visit type was conducted due to national recommendations for restrictions regarding the COVID-19 Pandemic (e.g. social distancing).  This format is felt to be most appropriate for this patient at this time.  All issues noted in this document were discussed and addressed.  No physical exam was performed (except for noted visual exam findings with Video Visits).  Please refer to the patient's chart (MyChart message for video visits and phone note for telephone visits) for the patient's consent to telehealth for [facility].  Date:  08/20/2018   ID:  Judith [name], Judith [name] 11-May-1950, MRN [id number]  Patient Location:  Home  Provider location:   [place]  PCP:  Rutherford Guys, MD  Cardiologist:  Fransico Him, MD  Electrophysiologist:  Thompson Grayer, MD   Chief Complaint:  Followup of CAD, HTN, hyperlipidemia and atrial fibrillation  History of Present Illness:    Judith [name] is a 68 y.o. female who presents via audio/video conferencing for a telehealth visit today.    Judith [name] is a 68 y.o. 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  She had a right BKA in January complicated by NSVT versus aberrancy and sotalol was stopped. She had recurrent A. fib and underwent cardioversion 05/22/2018 but unfortunately was back in A. fib with RVR when she saw Roderic Palau, NP 05/30/2018.  She was on amiodarone load and Eliquis.  She could not increase her diltiazem further because of soft blood pressures.  She recommended that the patient could continue to load on amiodarone and bring her back in 10 days to arrange for cardioversion. Followup EKG in afib clinic on 06/08/2018 showed normal sinus rhythm.  Her Amiodarone was decreased to 200 mg daily.    The patient does not have symptoms concerning for COVID-19 infection (fever, chills, cough, or new shortness of breath).    Prior CV studies:   The following studies were reviewed today:  none  Past Medical History:  Diagnosis Date  . Abnormal EKG 07/31/2013  . Arthritis    "hands" (03/06/2015)  . Asthma   . Carpal tunnel syndrome, bilateral   . Chronic kidney disease   . Complication of anesthesia    slow to wake up. Pt sts she woke up during surgery many years ago.  . Coronary artery disease     2 v CAD with CTO of the RCA and high grade bifurcational LCx/OM stenosis. S/P PCI DES x 2 to the LCx/OM.  Marland Kitchen Diabetic peripheral neuropathy ([place]) "since 1996"  . GERD (gastroesophageal reflux disease)   . Goiter   . Headache    migraines prior  to menopause  . History of shingles 06/01/2013  . Hyperlipidemia LDL goal <70 10/13/2015  . Hypertension   . Hyperthyroidism   . Osteomyelitis of foot (HCC)    Right  . PAF (paroxysmal atrial fibrillation) ([place]) 04/29/2015   CHADS2VASC score of 5 now on Apixaban  . Pneumonia ~ 1976  . Sleep apnea    Bipap  . Tremors of nervous system   . Type II diabetes mellitus (HCC)    insulin dependent   Past Surgical History:  Procedure Laterality Date  . ABDOMINAL HYSTERECTOMY  1988   age  30; CERVICAL DYSPLASIA; ovaries intact.   . AMPUTATION Right 01/23/2016   Procedure: Right 3rd Ray Amputation;  Surgeon: [name], MD;  Location: [place];  Service: Orthopedics;  Laterality: Right;  . AMPUTATION Right 02/13/2016   Procedure: Right Transmetatarsal Amputation;  Surgeon: [name], MD;  Location: [place];  Service: Orthopedics;  Laterality: Right;  . AMPUTATION Right 04/15/2018   Procedure: AMPUTATION BELOW KNEE;  Surgeon: [name], MD;  Location: [place];  Service: Orthopedics;  Laterality: Right;  . BIOPSY  04/16/2018   Procedure: BIOPSY;  Surgeon: Irene Shipper, MD;  Location: [facility] ENDOSCOPY;  Service: Endoscopy;;  . CARDIAC CATHETERIZATION N/A 02/27/2015   Procedure: Left Heart Cath and Coronary Angiography;  Surgeon: [name], MD; LAD 40%, mCFX 80%, OM 70%, RCA 100% calcified       . CARDIAC CATHETERIZATION N/A 03/06/2015   Procedure: Coronary Stent Intervention;  Surgeon: [name], MD;  Location: [place] CV LAB;  Service: Cardiovascular;  Laterality: N/A;  Mid CX 3.50x12 promus DES w/ 0% resdual and Prox OM1 2.50x20 promus DES w/ 20% residual  . CARDIOVERSION    . CARDIOVERSION N/A 05/22/2018   Procedure: CARDIOVERSION;  Surgeon: Skeet Latch, MD;  Location: [place];  Service: Cardiovascular;  Laterality: N/A;  . CARPAL TUNNEL RELEASE Right Nov 2015  . CARPAL TUNNEL RELEASE Right 1992; 05/2014   [name]; [place]  . CESAREAN SECTION  1982; 1984  . ESOPHAGOGASTRODUODENOSCOPY (EGD) WITH PROPOFOL N/A 04/16/2018   Procedure: ESOPHAGOGASTRODUODENOSCOPY (EGD) WITH PROPOFOL;  Surgeon: Irene Shipper, MD;  Location: [facility] ENDOSCOPY;  Service: Endoscopy;  Laterality: N/A;  . FOOT NEUROMA SURGERY Bilateral 2000  . I&D EXTREMITY Left 01/06/2018   Procedure: DEBRIDEMENT ULCER LEFT FOOT;  Surgeon: [name], MD;  Location: [place];  Service: Orthopedics;  Laterality: Left;  . KNEE ARTHROSCOPY Right ~ 2003   "meniscus repair"  . SHOULDER OPEN ROTATOR CUFF  REPAIR Right 1996; 1998   "w/fracture repair"  . THYROID SURGERY  2000   "removed lots of nodules"  . TONSILLECTOMY  1976     No outpatient medications have been marked as taking for the 08/21/18 encounter (Appointment) with Sueanne Margarita, MD.     Allergies:   Contrast media [iodinated diagnostic agents]; Novolog [insulin aspart]; Codeine; Iodine; Penicillins; Ace inhibitors; Demerol [meperidine]; Dilaudid [hydromorphone hcl]; Neosporin [neomycin-bacitracin zn-polymyx]; Percocet [oxycodone-acetaminophen]; and Tape   Social History   Tobacco Use  . Smoking status: Former Smoker    Packs/day: 0.00    Years: 41.00    Pack years: 0.00    Types: Cigarettes    Last attempt to quit: 03/05/2015    Years since quitting: 3.4  . Smokeless tobacco: Never Used  . Tobacco comment: 04/29/2015 "quit smoking cigarettes 02/27/2015"  Substance Use Topics  . Alcohol use: No  . Drug use: No     Family Hx: The patient's  family history includes Allergies in her sister; Breast cancer in her maternal grandmother and mother; COPD in her father; Cancer (age of onset: 7) in her mother; Emphysema in her maternal grandfather and paternal grandfather; Heart attack in her father; Heart disease (age of onset: 67) in her father; Hypertension in her father; Leukemia in her paternal grandmother; Lung cancer in her mother; Parkinson's disease in her father. There is no history of Thyroid disease.  ROS:   Please see the history of present illness.     All other systems reviewed and are negative.   Labs/Other Tests and Data Reviewed:    Recent Labs: 05/15/2018: TSH 1.013 07/07/2018: ALT 28; BUN 22; Creatinine, Ser 1.08; Hemoglobin 10.5; Magnesium 2.0; Platelets 490; Potassium 5.0; Sodium 138   Recent Lipid Panel Lab Results  Component Value Date/Time   CHOL 236 (H) 07/22/2017 08:57 AM   TRIG 544 (H) 07/22/2017 08:57 AM   HDL 30 (L) 07/22/2017 08:57 AM   CHOLHDL 7.9 (H) 07/22/2017 08:57 AM   CHOLHDL 5.8  (H) 02/05/2016 10:47 AM   LDLCALC Comment 07/22/2017 08:57 AM   LDLDIRECT 97 01/06/2017 09:04 AM    Wt Readings from Last 3 Encounters:  07/17/18 150 lb (68 kg)  07/07/18 153 lb (69.4 kg)  06/21/18 153 lb 6.4 oz (69.6 kg)     Objective:    Vital Signs:  There were no vitals taken for this visit.   CONSTITUTIONAL:  Well nourished, well developed female in no acute distress.  EYES: anicteric MOUTH: oral mucosa is pink RESPIRATORY: Normal respiratory effort, symmetric expansion CARDIOVASCULAR: No peripheral edema SKIN: No rash, lesions or ulcers MUSCULOSKELETAL: no digital cyanosis NEURO: Cranial Nerves II-XII grossly intact, moves all extremities PSYCH: Intact judgement and insight.  A&O x 3, Mood/affect appropriate   ASSESSMENT & PLAN:    1.  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  She will continue on Amio 200mg  daily and Cardizem CD 120mg  daily.  She will continue on Eliquis 5mg  BID.  Her creatinine was 1.08, K+ 5 nad Mag 2 on 07/06/2018.  She has not had any bleeding problems.   2.  ASCAD - status post DES to the circumflex/OM and known CTO of the RCA 2016.  She has not had any CP or SOB.  She will continue on statin.  She is not on ASA due to DOAC.  3.  HTN - she checks her BP at home and it is controlled. She will continue on Diltiazem CD 120mg  daily.   4.  Hyperlipidemia - her LDL goal is < 70.  She will continue on atorvastatin 20mg  daily and fenofibrate 160mg  daily.  Her LDL was   COVID-19 Education: The signs and symptoms of COVID-19 were discussed with the patient and how to seek care for testing (follow up with PCP or arrange E-visit).  The importance of social distancing was discussed today.  Patient Risk:   After full review of this patient's clinical status, I feel that they are at least  moderate risk at this time.  Time:   Today, I have spent 30 minutes with the patient reviewing chart and discussing medical problems including CAD, HTN, hyperlipidemia, PAF and reviewing symptoms of COVID 19 and the ways to protect against contracting the virus with telehealth technology.      Medication Adjustments/Labs and Tests  Ordered: Current medicines are reviewed at length with the patient today.  Concerns regarding medicines are outlined above.  Tests Ordered: No orders of the defined types were placed in this encounter.  Medication Changes: No orders of the defined types were placed in this encounter.   Disposition:  Follow up in 6 month(s)  Signed, Fransico Him, MD  08/20/2018 11:46 PM    [place]

## 2018-08-21 ENCOUNTER — Ambulatory Visit: Payer: Medicare PPO | Admitting: Cardiology

## 2018-08-21 NOTE — Telephone Encounter (Signed)
Left a message multiple time for the patient to confirm their appointment. Cancelled current appointment until the patient responds.

## 2018-08-23 ENCOUNTER — Encounter: Payer: Self-pay | Admitting: Physical Therapy

## 2018-08-23 ENCOUNTER — Other Ambulatory Visit: Payer: Self-pay

## 2018-08-23 ENCOUNTER — Ambulatory Visit: Payer: Medicare PPO | Admitting: Physical Therapy

## 2018-08-23 DIAGNOSIS — R2689 Other abnormalities of gait and mobility: Secondary | ICD-10-CM | POA: Diagnosis not present

## 2018-08-23 DIAGNOSIS — R293 Abnormal posture: Secondary | ICD-10-CM

## 2018-08-23 DIAGNOSIS — M6281 Muscle weakness (generalized): Secondary | ICD-10-CM

## 2018-08-23 DIAGNOSIS — M79661 Pain in right lower leg: Secondary | ICD-10-CM | POA: Diagnosis not present

## 2018-08-23 DIAGNOSIS — R2681 Unsteadiness on feet: Secondary | ICD-10-CM

## 2018-08-23 DIAGNOSIS — R296 Repeated falls: Secondary | ICD-10-CM | POA: Diagnosis not present

## 2018-08-23 NOTE — Therapy (Signed)
[facility] [address] [place], [place], [address] Phone: [phone]   Fax:  [phone]  Physical Therapy Treatment  Patient Details  Name: Judith [name] MRN: [id number] Date of Birth: 1950-06-04 Referring Provider (PT): [name], MD   Encounter Date: 08/23/2018  PT End of Session - 08/23/18 1108    Visit Number  2    Number of Visits  25    Date for PT Re-Evaluation  11/15/18    Authorization Type  [employer] & Medicaid    PT Start Time  1102    PT Stop Time  1146    PT Time Calculation (min)  44 min    Equipment Utilized During Treatment  Gait belt    Activity Tolerance  Patient tolerated treatment well;No increased pain    Behavior During Therapy  WFL for tasks assessed/performed       Past Medical History:  Diagnosis Date  . Abnormal EKG 07/31/2013  . Arthritis    "hands" (03/06/2015)  . Asthma   . Carpal tunnel syndrome, bilateral   . Chronic kidney disease   . Complication of anesthesia    slow to wake up. Pt sts she woke up during surgery many years ago.  . Coronary artery disease     2 v CAD with CTO of the RCA and high grade bifurcational LCx/OM stenosis. S/P PCI DES x 2 to the LCx/OM.  Marland Kitchen Diabetic peripheral neuropathy ([place]) "since 1996"  . GERD (gastroesophageal reflux disease)   . Goiter   . Headache    migraines prior to menopause  . History of shingles 06/01/2013  . Hyperlipidemia LDL goal <70 10/13/2015  . Hypertension   . Hyperthyroidism   . Osteomyelitis of foot (HCC)    Right  . PAF (paroxysmal atrial fibrillation) ([place]) 04/29/2015   CHADS2VASC score of 5 now on Apixaban  . Pneumonia ~ 1976  . Sleep apnea    Bipap  . Tremors of nervous system   . Type II diabetes mellitus (HCC)    insulin dependent    Past Surgical History:  Procedure Laterality Date  . ABDOMINAL HYSTERECTOMY  1988   age 17; CERVICAL DYSPLASIA; ovaries intact.   . AMPUTATION Right 01/23/2016    Procedure: Right 3rd Ray Amputation;  Surgeon: [name], MD;  Location: [facility];  Service: Orthopedics;  Laterality: Right;  . AMPUTATION Right 02/13/2016   Procedure: Right Transmetatarsal Amputation;  Surgeon: [name], MD;  Location: [facility];  Service: Orthopedics;  Laterality: Right;  . AMPUTATION Right 04/15/2018   Procedure: AMPUTATION BELOW KNEE;  Surgeon: [name], MD;  Location: [facility];  Service: Orthopedics;  Laterality: Right;  . BIOPSY  04/16/2018   Procedure: BIOPSY;  Surgeon: Irene Shipper, MD;  Location: [facility] ENDOSCOPY;  Service: Endoscopy;;  . CARDIAC CATHETERIZATION N/A 02/27/2015   Procedure: Left Heart Cath and Coronary Angiography;  Surgeon: [name], MD; LAD 40%, mCFX 80%, OM 70%, RCA 100% calcified       . CARDIAC CATHETERIZATION N/A 03/06/2015   Procedure: Coronary Stent Intervention;  Surgeon: [name], MD;  Location: [facility];  Service: Cardiovascular;  Laterality: N/A;  Mid CX 3.50x12 promus DES w/ 0% resdual and Prox OM1 2.50x20 promus DES w/ 20% residual  . CARDIOVERSION    . CARDIOVERSION N/A 05/22/2018   Procedure: CARDIOVERSION;  Surgeon: Skeet Latch, MD;  Location: [facility];  Service: Cardiovascular;  Laterality: N/A;  . CARPAL TUNNEL RELEASE  Right Nov 2015  . CARPAL TUNNEL RELEASE Right 1992; 05/2014   [name]; [place]  . CESAREAN SECTION  1982; 1984  . ESOPHAGOGASTRODUODENOSCOPY (EGD) WITH PROPOFOL N/A 04/16/2018   Procedure: ESOPHAGOGASTRODUODENOSCOPY (EGD) WITH PROPOFOL;  Surgeon: Irene Shipper, MD;  Location: [facility] ENDOSCOPY;  Service: Endoscopy;  Laterality: N/A;  . FOOT NEUROMA SURGERY Bilateral 2000  . I&D EXTREMITY Left 01/06/2018   Procedure: DEBRIDEMENT ULCER LEFT FOOT;  Surgeon: [name], MD;  Location: [facility];  Service: Orthopedics;  Laterality: Left;  . KNEE ARTHROSCOPY Right ~ 2003   "meniscus repair"  . SHOULDER OPEN ROTATOR CUFF REPAIR Right 1996; 1998   "w/fracture repair"  . THYROID SURGERY  2000    "removed lots of nodules"  . TONSILLECTOMY  1976    There were no vitals filed for this visit.  Subjective Assessment - 08/23/18 1107    Subjective  Only recent fall she recalls in rolling out of the bed, unsure of if this was before or after her evaluation last week. No pain.     Pertinent History  R TTA, asthma, CAD, IDDM2, neuropathy, shingles, HTN, Left non-traumatic intracerebral hemorrhage, CKD stage 3, right shoulder impingement syndrome    Limitations  Lifting;Standing;Walking;House hold activities    Patient Stated Goals  To use prosthesis to walk in community & home, gardening, [place]    Currently in Pain?  No/denies           [facility] Adult PT Treatment/Exercise - 08/23/18 1109      Transfers   Transfers  Sit to Stand;Stand to Sit    Sit to Stand  5: Supervision;With upper extremity assist;From elevated surface;With armrests;From chair/3-in-1    Stand to Sit  5: Supervision;With upper extremity assist;With armrests;To chair/3-in-1      Ambulation/Gait   Ambulation/Gait  Yes    Ambulation/Gait Assistance  4: Min guard    Ambulation/Gait Assistance Details  cues to relax shoulders, increase base of support. pt with good step length today and less UE use on walker. Pt shown how to check her walker for correct height at home.     Ambulation Distance (Feet)  65 Feet   x2   Assistive device  Rolling walker;Prosthesis    Gait Pattern  Decreased stance time - right;Decreased stride length;Decreased hip/knee flexion - right;Decreased weight shift to right;Right circumduction;Antalgic;Lateral hip instability;Trunk rotated posteriorly on right;Trunk flexed;Narrow base of support;Step-through pattern    Ambulation Surface  Level;Indoor      Neuro Re-ed    Neuro Re-ed Details   pt educated on and provided sink HEP for balance and proprioception      Prosthetics   Prosthetic Care Comments   educated on importance of consistent wear times for skin integrity and  building tolerance to prosthesis without creating pain. pt to keep to a 3-4 hour 2x day schedule at this time. discussed sock ply management as she now has bil pre tib pads inside socket after seeing Bobby on Monday.     Current prosthetic wear tolerance (days/week)   daily    Current prosthetic wear tolerance (#hours/day)   has been varied from 2 up to 4 hours of wear at a time, 2x a day.     Residual limb condition   dry scabs (2) intact on inicision. has a new scab on back of thigh which was covered with small Tegaderm to keep liner from rubbing on it.     Education Provided  Residual limb care;Correct ply  sock adjustment;Proper Donning;Proper Doffing;Proper wear schedule/adjustment;Proper weight-bearing schedule/adjustment;Other (comment)   sink HEP   Person(s) Educated  Patient    Education Method  Explanation;Demonstration;Verbal cues;Handout    Education Method  Verbalized understanding    Donning Prosthesis  Supervision    Doffing Prosthesis  Supervision           PT Short Term Goals - 08/18/18 8099      PT SHORT TERM GOAL #1   Title  Patient verbalizes proper cleaning & demonstrates proper donning of prosthesis. (All STGs Target Date 09/15/2018)    Time  4    Period  Weeks    Status  New    Target Date  09/15/18      PT SHORT TERM GOAL #2   Title  Patient tolerates prosthesis wear >8hrs total /day without skin issues.     Time  4    Period  Weeks    Status  New    Target Date  09/15/18      PT SHORT TERM GOAL #3   Title  Patient is able to stand 1 minute without UE support and reach 2" anteriorly & to knee level safely with supervision.     Time  4    Period  Weeks    Status  New    Target Date  09/15/18      PT SHORT TERM GOAL #4   Title  Patient ambulates 200' with RW & prosthesis with supervision.     Time  4    Period  Weeks    Status  New    Target Date  09/15/18      PT SHORT TERM GOAL #5   Title  Patient negotiates stairs with 2 rails, ramp & curb with  RW & prosthesis with minimal assist.     Time  4    Period  Weeks    Status  New    Target Date  09/15/18        PT Long Term Goals - 08/18/18 0603      PT LONG TERM GOAL #1   Title  Patient verbalizes & demonstrates understanding of prosthetic care to enable safe use of prosthesis (All LTGs Target Date 11/15/2018)    Time  12    Period  Weeks    Status  New    Target Date  11/15/18      PT LONG TERM GOAL #2   Title  Patient tolerates prosthesis wear >90% of awake hours without skin or pain issues to enable function throughout her day.     Time  12    Period  Weeks    Status  New    Target Date  11/15/18      PT LONG TERM GOAL #3   Title  Berg Balance >/= 45/56 to indicate lower fall risk.     Time  12    Period  Weeks    Status  New    Target Date  11/15/18      PT LONG TERM GOAL #4   Title  Patient ambulates >500' outdoors with LRAD modified independent to enable safe function in community.     Time  12    Period  Weeks    Status  New    Target Date  11/15/18      PT LONG TERM GOAL #5   Title  Patient negotiates stairs, ramps & curbs with LRAD & Prosthesis modified independent to enable community  access.     Time  12    Period  Weeks    Status  New    Target Date  11/15/18      PT LONG TERM GOAL #6   Title  Patient ambulates 45' around furniture with cane or less & prosthesis carrying household items modified independent for household function.     Time  12    Period  Weeks    Status  New    Target Date  11/15/18            Plan - 08/23/18 1109    Clinical Impression Statement  Today's skilled session focused on prosthetic education, issuing of sink HEP and gait with RW/prosthesis. The pt is progressing well toward goals and should benefit from continued PT to progress toward unmet goals.     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     Consulted and Agree with Plan of Care  Patient       Patient will benefit from skilled therapeutic intervention in order to improve the following deficits and impairments:  Abnormal gait, Decreased activity tolerance, Decreased balance, Decreased endurance, Decreased knowledge of use of DME, Decreased mobility, Decreased range of motion, Decreased strength, Dizziness, Increased edema, Postural dysfunction, Prosthetic Dependency, Pain  Visit Diagnosis: Abnormal posture  Muscle weakness (generalized)  Unsteadiness on feet  Other abnormalities of gait and mobility     Problem List Patient Active Problem List   Diagnosis Date Noted  . Urinary tract infection 06/30/2018  . ICH (intracerebral hemorrhage) ([place]) 06/27/2018  . DKA (diabetic ketoacidoses) ([place]) 06/26/2018  . V-tach ([place]) 05/14/2018  . Seizure ([place]) 05/14/2018  . Slow transit constipation   . Fall   . Labile blood glucose   . Labile blood pressure   . Chronic combined systolic and diastolic congestive heart failure ([place])   . Chronic diastolic congestive heart failure ([place])   . CKD (chronic kidney disease), stage III ([place])    . Type 2 diabetes mellitus with peripheral neuropathy (HCC)   . Amputation of right lower extremity below knee ([place]) 04/20/2018  . Unilateral traumatic amputation of right leg below knee with complication, initial encounter ([place])   . Post-operative pain   . PAF (paroxysmal atrial fibrillation) ([place])   . Duodenal ulcer   . Atrial fibrillation with RVR ([place]) 04/14/2018  . Foot osteomyelitis, right ([place]) 04/14/2018  . Cutaneous abscess of left foot   . Acute respiratory failure with hypoxia ([place])   . Cellulitis 01/02/2018  . LGI bleed   . Acute blood loss anemia   . Wide-complex tachycardia ([place]) 07/04/2017  . Type II diabetes mellitus, uncontrolled ([place]) 07/04/2017  . Peripheral neuropathy 07/04/2017  . H/O hyperthyroidism 07/04/2017  . Atrial fibrillation with rapid ventricular response ([place]) 08/06/2016  . S/P transmetatarsal amputation of foot, right (  [place]) 04/19/2016  . Obstructive sleep apnea 11/26/2015  . Bilateral carpal tunnel syndrome 11/26/2015  . Hypomagnesemia 11/16/2015  . Hyperlipidemia LDL goal <70 10/13/2015  . Abnormality of gait 09/02/2015  . Memory loss 08/12/2015  . Diabetic peripheral neuropathy ([place]) 08/12/2015  . Vitamin D deficiency 08/12/2015  . Hyperthyroidism 04/30/2015  . Heme positive stool   . Persistent atrial fibrillation 04/29/2015  . Coronary artery disease with stable angina pectoris ([place]) 03/29/2015  . Abnormal nuclear stress test   . History of goiter 09/28/2014  . GERD (gastroesophageal reflux disease) 07/31/2013  . Depression with anxiety 06/01/2013  . DM (diabetes mellitus), type 2 with peripheral vascular complications ([place]) 89/78/4784  . HTN (hypertension) 01/30/2013    Willow Ora, PTA, [facility] [address] [place], [place] [address] [phone] 08/23/18, 1:59 PM   Name: Judith [name] MRN: [id number] Date of Birth: 06/27/1950

## 2018-08-23 NOTE — Patient Instructions (Signed)
Do each exercise 2  times per day Do each exercise 10 repetitions Hold each exercise for 5 seconds to feel your location  AT [place].  USE TAPE ON FLOOR TO MARK THE MIDLINE POSITION. You also should try to feel with your limb pressure in socket.  You are trying to feel with limb what you used to feel with the bottom of your foot.  1. Side to Side Shift: Moving your hips only (not shoulders): move weight onto your left leg, HOLD/FEEL.  Move back to equal weight on each leg, HOLD/FEEL. Move weight onto your right leg, HOLD/FEEL. Move back to equal weight on each leg, HOLD/FEEL. Repeat. 2. Front to Back Shift: Moving your hips only (not shoulders): move your weight forward onto your toes, HOLD/FEEL. Move your weight back to equal Flat Foot on both legs, HOLD/FEEL. Move your weight back onto your heels, HOLD/FEEL. Move your weight back to equal on both legs, HOLD/FEEL. Repeat. 3. Moving Cones / Cups: With equal weight on each leg: Hold on with one hand the first time, then progress to no hand supports. Move cups from one side of sink to the other. Place cups ~2" out of your reach, progress to 10" beyond reach. 4. Overhead/Upward Reaching: alternated reaching up to top cabinets or ceiling if no cabinets present. Keep equal weight on each leg. Start with one hand support on counter while other hand reaches and progress to no hand support with reaching. 5.   Looking Over Shoulders: With equal weight on each leg: alternate turning to look  over your shoulders with one hand support on counter as needed. Shift weight to side then, pull hip then shoulder then head/eyes around to look behind you on opposite side. Start with one hand support & progress to no hand support.

## 2018-08-24 ENCOUNTER — Ambulatory Visit: Payer: Medicare PPO | Admitting: Physical Therapy

## 2018-08-24 ENCOUNTER — Encounter: Payer: Self-pay | Admitting: Physical Therapy

## 2018-08-24 DIAGNOSIS — R2681 Unsteadiness on feet: Secondary | ICD-10-CM | POA: Diagnosis not present

## 2018-08-24 DIAGNOSIS — M79661 Pain in right lower leg: Secondary | ICD-10-CM | POA: Diagnosis not present

## 2018-08-24 DIAGNOSIS — R296 Repeated falls: Secondary | ICD-10-CM | POA: Diagnosis not present

## 2018-08-24 DIAGNOSIS — M6281 Muscle weakness (generalized): Secondary | ICD-10-CM

## 2018-08-24 DIAGNOSIS — R2689 Other abnormalities of gait and mobility: Secondary | ICD-10-CM

## 2018-08-24 DIAGNOSIS — R293 Abnormal posture: Secondary | ICD-10-CM | POA: Diagnosis not present

## 2018-08-24 NOTE — Therapy (Signed)
[facility] [address] [place], [place], [address] Phone: [phone]   Fax:  [phone]  Physical Therapy Treatment  Patient Details  Name: Judith [name] MRN: [id number] Date of Birth: Feb 04, 1951 Referring Provider (PT): [name], MD   Encounter Date: 08/24/2018  PT End of Session - 08/24/18 1149    Visit Number  3    Number of Visits  25    Date for PT Re-Evaluation  11/15/18    Authorization Type  [employer] & Medicaid    PT Start Time  1107    PT Stop Time  1147    PT Time Calculation (min)  40 min    Equipment Utilized During Treatment  Gait belt    Activity Tolerance  Patient tolerated treatment well;No increased pain    Behavior During Therapy  WFL for tasks assessed/performed       Past Medical History:  Diagnosis Date  . Abnormal EKG 07/31/2013  . Arthritis    "hands" (03/06/2015)  . Asthma   . Carpal tunnel syndrome, bilateral   . Chronic kidney disease   . Complication of anesthesia    slow to wake up. Pt sts she woke up during surgery many years ago.  . Coronary artery disease     2 v CAD with CTO of the RCA and high grade bifurcational LCx/OM stenosis. S/P PCI DES x 2 to the LCx/OM.  Marland Kitchen Diabetic peripheral neuropathy ([place]) "since 1996"  . GERD (gastroesophageal reflux disease)   . Goiter   . Headache    migraines prior to menopause  . History of shingles 06/01/2013  . Hyperlipidemia LDL goal <70 10/13/2015  . Hypertension   . Hyperthyroidism   . Osteomyelitis of foot (HCC)    Right  . PAF (paroxysmal atrial fibrillation) ([place]) 04/29/2015   CHADS2VASC score of 5 now on Apixaban  . Pneumonia ~ 1976  . Sleep apnea    Bipap  . Tremors of nervous system   . Type II diabetes mellitus (HCC)    insulin dependent    Past Surgical History:  Procedure Laterality Date  . ABDOMINAL HYSTERECTOMY  1988   age 46; CERVICAL DYSPLASIA; ovaries intact.   . AMPUTATION Right 01/23/2016    Procedure: Right 3rd Ray Amputation;  Surgeon: [name], MD;  Location: [facility];  Service: Orthopedics;  Laterality: Right;  . AMPUTATION Right 02/13/2016   Procedure: Right Transmetatarsal Amputation;  Surgeon: [name], MD;  Location: [facility];  Service: Orthopedics;  Laterality: Right;  . AMPUTATION Right 04/15/2018   Procedure: AMPUTATION BELOW KNEE;  Surgeon: [name], MD;  Location: [facility];  Service: Orthopedics;  Laterality: Right;  . BIOPSY  04/16/2018   Procedure: BIOPSY;  Surgeon: Irene Shipper, MD;  Location: [facility] ENDOSCOPY;  Service: Endoscopy;;  . CARDIAC CATHETERIZATION N/A 02/27/2015   Procedure: Left Heart Cath and Coronary Angiography;  Surgeon: [name], MD; LAD 40%, mCFX 80%, OM 70%, RCA 100% calcified       . CARDIAC CATHETERIZATION N/A 03/06/2015   Procedure: Coronary Stent Intervention;  Surgeon: [name], MD;  Location: [facility] CV LAB;  Service: Cardiovascular;  Laterality: N/A;  Mid CX 3.50x12 promus DES w/ 0% resdual and Prox OM1 2.50x20 promus DES w/ 20% residual  . CARDIOVERSION    . CARDIOVERSION N/A 05/22/2018   Procedure: CARDIOVERSION;  Surgeon: Skeet Latch, MD;  Location: [facility];  Service: Cardiovascular;  Laterality: N/A;  . CARPAL TUNNEL RELEASE  Right Nov 2015  . CARPAL TUNNEL RELEASE Right 1992; 05/2014   [name]; [place]  . CESAREAN SECTION  1982; 1984  . ESOPHAGOGASTRODUODENOSCOPY (EGD) WITH PROPOFOL N/A 04/16/2018   Procedure: ESOPHAGOGASTRODUODENOSCOPY (EGD) WITH PROPOFOL;  Surgeon: Irene Shipper, MD;  Location: [facility] ENDOSCOPY;  Service: Endoscopy;  Laterality: N/A;  . FOOT NEUROMA SURGERY Bilateral 2000  . I&D EXTREMITY Left 01/06/2018   Procedure: DEBRIDEMENT ULCER LEFT FOOT;  Surgeon: [name], MD;  Location: [facility];  Service: Orthopedics;  Laterality: Left;  . KNEE ARTHROSCOPY Right ~ 2003   "meniscus repair"  . SHOULDER OPEN ROTATOR CUFF REPAIR Right 1996; 1998   "w/fracture repair"  . THYROID SURGERY  2000    "removed lots of nodules"  . TONSILLECTOMY  1976    There were no vitals filed for this visit.  Subjective Assessment - 08/24/18 1110    Subjective  No falls. She started wearing prosthesis 3hrs 2x/day.     Pertinent History  R TTA, asthma, CAD, IDDM2, neuropathy, shingles, HTN, Left non-traumatic intracerebral hemorrhage, CKD stage 3, right shoulder impingement syndrome    Limitations  Lifting;Standing;Walking;House hold activities    Patient Stated Goals  To use prosthesis to walk in community & home, gardening, [place]    Currently in Pain?  No/denies                       [facility] Adult PT Treatment/Exercise - 08/24/18 1105      Transfers   Transfers  Sit to Stand;Stand to Sit;Floor to Transfer    Sit to Stand  5: Supervision;With upper extremity assist;With armrests;From chair/3-in-1;4: Min assist   progressed to chairs without armrests initially MinA   Sit to Stand Details  Visual cues/gestures for sequencing;Verbal cues for technique;Verbal cues for sequencing    Stand to Sit  5: Supervision;With upper extremity assist;With armrests;To chair/3-in-1;4: Min guard   progressed to chairs without armrests with Min guard initial   Stand to Sit Details (indicate cue type and reason)  Verbal cues for sequencing;Verbal cues for technique;Visual cues for safe use of DME/AE;Visual cues/gestures for sequencing    Floor to Transfer  4: Min assist;From chair/3-in-1   pushing on chair   Floor to Transfer Details  Visual cues/gestures for sequencing;Verbal cues for technique;Verbal cues for sequencing      Ambulation/Gait   Ambulation/Gait  Yes    Ambulation/Gait Assistance  5: Supervision    Ambulation/Gait Assistance Details  cues on posture, not staring at floor/glancing & step width    Ambulation Distance (Feet)  100 Feet   100', 50' X 2   Assistive device  Rolling walker;Prosthesis    Gait Pattern  Decreased stance time - right;Decreased stride  length;Decreased hip/knee flexion - right;Decreased weight shift to right;Right circumduction;Antalgic;Lateral hip instability;Trunk rotated posteriorly on right;Trunk flexed;Narrow base of support;Step-through pattern    Ambulation Surface  Indoor;Level    Stairs  Yes    Stairs Assistance  4: Min guard    Stair Management Technique  Two rails;Step to pattern;Forwards    Number of Stairs  4    Height of Stairs  6    Ramp  4: Min assist   RW & prosthesis   Ramp Details (indicate cue type and reason)  demo, verbal & tactile cues on technique with upright posture & wt shift    Curb  4: Min assist   RW & prosthesis   Curb Details (indicate cue type and  reason)  demo, verbal & tactile cues on technique & sequence      High Level Balance   High Level Balance Activities  Negotiating over obstacles   stepping over 1" pole with RW & prosthesis   High Level Balance Comments  demo & verbal cues on technique with prosthesis,       Neuro Re-ed    Neuro Re-ed Details   PT verbally reviewed sink HEP with cues on sensing socket pressure with weight shifts.       Prosthetics   Prosthetic Care Comments   verbal & demo cues on putting shoe on/off prosthesis & donning with long pants.     Current prosthetic wear tolerance (days/week)   daily    Current prosthetic wear tolerance (#hours/day)   3 hrs 2x/day started 08/23/2018    Residual limb condition   posterior wound on thigh is covered with Tegaderm. 2 dry scabs on incision with no signs of infection.     Education Provided  Residual limb care;Correct ply sock adjustment;Proper Donning;Proper Doffing;Proper wear schedule/adjustment;Proper weight-bearing schedule/adjustment;Other (comment)   sink HEP   Person(s) Educated  Patient    Education Method  Explanation;Demonstration;Tactile cues;Verbal cues    Education Method  Verbalized understanding;Needs further instruction    Donning Prosthesis  Supervision    Doffing Prosthesis  Supervision                PT Short Term Goals - 08/18/18 2993      PT SHORT TERM GOAL #1   Title  Patient verbalizes proper cleaning & demonstrates proper donning of prosthesis. (All STGs Target Date 09/15/2018)    Time  4    Period  Weeks    Status  New    Target Date  09/15/18      PT SHORT TERM GOAL #2   Title  Patient tolerates prosthesis wear >8hrs total /day without skin issues.     Time  4    Period  Weeks    Status  New    Target Date  09/15/18      PT SHORT TERM GOAL #3   Title  Patient is able to stand 1 minute without UE support and reach 2" anteriorly & to knee level safely with supervision.     Time  4    Period  Weeks    Status  New    Target Date  09/15/18      PT SHORT TERM GOAL #4   Title  Patient ambulates 200' with RW & prosthesis with supervision.     Time  4    Period  Weeks    Status  New    Target Date  09/15/18      PT SHORT TERM GOAL #5   Title  Patient negotiates stairs with 2 rails, ramp & curb with RW & prosthesis with minimal assist.     Time  4    Period  Weeks    Status  New    Target Date  09/15/18        PT Long Term Goals - 08/18/18 0603      PT LONG TERM GOAL #1   Title  Patient verbalizes & demonstrates understanding of prosthetic care to enable safe use of prosthesis (All LTGs Target Date 11/15/2018)    Time  12    Period  Weeks    Status  New    Target Date  11/15/18      PT LONG TERM  GOAL #2   Title  Patient tolerates prosthesis wear >90% of awake hours without skin or pain issues to enable function throughout her day.     Time  12    Period  Weeks    Status  New    Target Date  11/15/18      PT LONG TERM GOAL #3   Title  Berg Balance >/= 45/56 to indicate lower fall risk.     Time  12    Period  Weeks    Status  New    Target Date  11/15/18      PT LONG TERM GOAL #4   Title  Patient ambulates >500' outdoors with LRAD modified independent to enable safe function in community.     Time  12    Period  Weeks    Status  New     Target Date  11/15/18      PT LONG TERM GOAL #5   Title  Patient negotiates stairs, ramps & curbs with LRAD & Prosthesis modified independent to enable community access.     Time  12    Period  Weeks    Status  New    Target Date  11/15/18      PT LONG TERM GOAL #6   Title  Patient ambulates 68' around furniture with cane or less & prosthesis carrying household items modified independent for household function.     Time  12    Period  Weeks    Status  New    Target Date  11/15/18            Plan - 08/24/18 1413    Clinical Impression Statement  Today's skilled session focused on introducing stairs, ramps, curbs & sit/stand chair without armrests using UEs. Patient to have basic understanding but needs additional instructions. Patient is tolerating increased prosthesis wear.     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 Progress wear weekly. Balance training.     Consulted and Agree with Plan of Care  Patient       Patient will benefit from skilled  therapeutic intervention in order to improve the following deficits and impairments:  Abnormal gait, Decreased activity tolerance, Decreased balance, Decreased endurance, Decreased knowledge of use of DME, Decreased mobility, Decreased range of motion, Decreased strength, Dizziness, Increased edema, Postural dysfunction, Prosthetic Dependency, Pain  Visit Diagnosis: Abnormal posture  Muscle weakness (generalized)  Unsteadiness on feet  Other abnormalities of gait and mobility     Problem List Patient Active Problem List   Diagnosis Date Noted  . Urinary tract infection 06/30/2018  . ICH (intracerebral hemorrhage) ([place]) 06/27/2018  . DKA (diabetic ketoacidoses) ([place]) 06/26/2018  . V-tach ([place]) 05/14/2018  . Seizure ([place]) 05/14/2018  . Slow transit constipation   . Fall   . Labile  blood glucose   . Labile blood pressure   . Chronic combined systolic and diastolic congestive heart failure ([place])   . Chronic diastolic congestive heart failure ([place])   . CKD (chronic kidney disease), stage III ([place])   . Type 2 diabetes mellitus with peripheral neuropathy (HCC)   . Amputation of right lower extremity below knee ([place]) 04/20/2018  . 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 Post-operative pain   . PAF (paroxysmal atrial fibrillation) ([place])   . Duodenal ulcer   . Atrial fibrillation with RVR ([place]) 04/14/2018  . Foot osteomyelitis, right ([place]) 04/14/2018  . Cutaneous abscess of left foot   . Acute respiratory failure with hypoxia ([place])   . Cellulitis 01/02/2018  . LGI bleed   . Acute blood loss anemia   . Wide-complex tachycardia ([place]) 07/04/2017  . Type II diabetes mellitus, uncontrolled ([place]) 07/04/2017  . Peripheral neuropathy 07/04/2017  . H/O hyperthyroidism 07/04/2017  . Atrial fibrillation with rapid ventricular response ([place]) 08/06/2016  . S/P transmetatarsal amputation of foot, right ([place]) 04/19/2016  .  Obstructive sleep apnea 11/26/2015  . Bilateral carpal tunnel syndrome 11/26/2015  . Hypomagnesemia 11/16/2015  . Hyperlipidemia LDL goal <70 10/13/2015  . Abnormality of gait 09/02/2015  . Memory loss 08/12/2015  . Diabetic peripheral neuropathy ([place]) 08/12/2015  . Vitamin D deficiency 08/12/2015  . Hyperthyroidism 04/30/2015  . Heme positive stool   . Persistent atrial fibrillation 04/29/2015  . Coronary artery disease with stable angina pectoris ([place]) 03/29/2015  . Abnormal nuclear stress test   . History of goiter 09/28/2014  . GERD (gastroesophageal reflux disease) 07/31/2013  . Depression with anxiety 06/01/2013  . DM (diabetes mellitus), type 2 with peripheral vascular complications ([place]) 54/65/6812  . HTN (hypertension) 01/30/2013    Jamey Reas PT, DPT 08/24/2018, 2:16 PM  [facility] [address] [place], [place], [address] Phone: [phone]   Fax:  [phone]  Name: Judith [name] MRN: [id number] Date of Birth: October 08, 1950

## 2018-08-28 ENCOUNTER — Encounter: Payer: Self-pay | Admitting: Physical Therapy

## 2018-08-28 ENCOUNTER — Ambulatory Visit: Payer: Medicare PPO | Admitting: Physical Therapy

## 2018-08-28 ENCOUNTER — Other Ambulatory Visit: Payer: Self-pay

## 2018-08-28 DIAGNOSIS — M6281 Muscle weakness (generalized): Secondary | ICD-10-CM

## 2018-08-28 DIAGNOSIS — R296 Repeated falls: Secondary | ICD-10-CM | POA: Diagnosis not present

## 2018-08-28 DIAGNOSIS — R2689 Other abnormalities of gait and mobility: Secondary | ICD-10-CM

## 2018-08-28 DIAGNOSIS — R2681 Unsteadiness on feet: Secondary | ICD-10-CM | POA: Diagnosis not present

## 2018-08-28 DIAGNOSIS — R293 Abnormal posture: Secondary | ICD-10-CM | POA: Diagnosis not present

## 2018-08-28 DIAGNOSIS — M79661 Pain in right lower leg: Secondary | ICD-10-CM | POA: Diagnosis not present

## 2018-08-28 NOTE — Therapy (Signed)
[facility] [address] [place], [place], [address] Phone: [phone]   Fax:  [phone]  Physical Therapy Treatment  Patient Details  Name: Judith [name] MRN: [id number] Date of Birth: June 17, 1950 Referring Provider (PT): [name], MD   Encounter Date: 08/28/2018  PT End of Session - 08/28/18 1726    Visit Number  4    Number of Visits  25    Date for PT Re-Evaluation  11/15/18    Authorization Type  [employer] & Medicaid    PT Start Time  1315    PT Stop Time  1400    PT Time Calculation (min)  45 min    Equipment Utilized During Treatment  Gait belt    Activity Tolerance  Patient tolerated treatment well;No increased pain    Behavior During Therapy  WFL for tasks assessed/performed       Past Medical History:  Diagnosis Date  . Abnormal EKG 07/31/2013  . Arthritis    "hands" (03/06/2015)  . Asthma   . Carpal tunnel syndrome, bilateral   . Chronic kidney disease   . Complication of anesthesia    slow to wake up. Pt sts she woke up during surgery many years ago.  . Coronary artery disease     2 v CAD with CTO of the RCA and high grade bifurcational LCx/OM stenosis. S/P PCI DES x 2 to the LCx/OM.  Marland Kitchen Diabetic peripheral neuropathy ([place]) "since 1996"  . GERD (gastroesophageal reflux disease)   . Goiter   . Headache    migraines prior to menopause  . History of shingles 06/01/2013  . Hyperlipidemia LDL goal <70 10/13/2015  . Hypertension   . Hyperthyroidism   . Osteomyelitis of foot (HCC)    Right  . PAF (paroxysmal atrial fibrillation) ([place]) 04/29/2015   CHADS2VASC score of 5 now on Apixaban  . Pneumonia ~ 1976  . Sleep apnea    Bipap  . Tremors of nervous system   . Type II diabetes mellitus (HCC)    insulin dependent    Past Surgical History:  Procedure Laterality Date  . ABDOMINAL HYSTERECTOMY  1988   age 30; CERVICAL DYSPLASIA; ovaries intact.   . AMPUTATION Right 01/23/2016   Procedure: Right 3rd Ray Amputation;  Surgeon: [name], MD;  Location: [facility];  Service: Orthopedics;  Laterality: Right;  . AMPUTATION Right 02/13/2016   Procedure: Right Transmetatarsal Amputation;  Surgeon: [name], MD;  Location: [facility];  Service: Orthopedics;  Laterality: Right;  . AMPUTATION Right 04/15/2018   Procedure: AMPUTATION BELOW KNEE;  Surgeon: [name], MD;  Location: [facility];  Service: Orthopedics;  Laterality: Right;  . BIOPSY  04/16/2018   Procedure: BIOPSY;  Surgeon: Irene Shipper, MD;  Location: [facility] ENDOSCOPY;  Service: Endoscopy;;  . CARDIAC CATHETERIZATION N/A 02/27/2015   Procedure: Left Heart Cath and Coronary Angiography;  Surgeon: [name], MD; LAD 40%, mCFX 80%, OM 70%, RCA 100% calcified       . CARDIAC CATHETERIZATION N/A 03/06/2015   Procedure: Coronary Stent Intervention;  Surgeon: [name], MD;  Location: [facility];  Service: Cardiovascular;  Laterality: N/A;  Mid CX 3.50x12 promus DES w/ 0% resdual and Prox OM1 2.50x20 promus DES w/ 20% residual  . CARDIOVERSION    . CARDIOVERSION N/A 05/22/2018   Procedure: CARDIOVERSION;  Surgeon: Skeet Latch, MD;  Location: [facility];  Service: Cardiovascular;  Laterality: N/A;  . CARPAL TUNNEL RELEASE Right  Nov 2015  . CARPAL TUNNEL RELEASE Right 1992; 05/2014   [name]; [place]  . CESAREAN SECTION  1982; 1984  . ESOPHAGOGASTRODUODENOSCOPY (EGD) WITH PROPOFOL N/A 04/16/2018   Procedure: ESOPHAGOGASTRODUODENOSCOPY (EGD) WITH PROPOFOL;  Surgeon: Irene Shipper, MD;  Location: [facility] ENDOSCOPY;  Service: Endoscopy;  Laterality: N/A;  . FOOT NEUROMA SURGERY Bilateral 2000  . I&D EXTREMITY Left 01/06/2018   Procedure: DEBRIDEMENT ULCER LEFT FOOT;  Surgeon: [name], MD;  Location: [facility];  Service: Orthopedics;  Laterality: Left;  . KNEE ARTHROSCOPY Right ~ 2003   "meniscus repair"  . SHOULDER OPEN ROTATOR CUFF REPAIR Right 1996; 1998   "w/fracture repair"  . THYROID SURGERY  2000    "removed lots of nodules"  . TONSILLECTOMY  1976    There were no vitals filed for this visit.  Subjective Assessment - 08/28/18 1324    Subjective  No falls. She wore prosthesis 3 hrs 2x/day but went over one day for 4-5hrs.     Pertinent History  R TTA, asthma, CAD, IDDM2, neuropathy, shingles, HTN, Left non-traumatic intracerebral hemorrhage, CKD stage 3, right shoulder impingement syndrome    Limitations  Lifting;Standing;Walking;House hold activities    Patient Stated Goals  To use prosthesis to walk in community & home, gardening, [place]    Currently in Pain?  No/denies                       [facility] Adult PT Treatment/Exercise - 08/28/18 1315      Transfers   Transfers  Sit to Stand;Stand to Sit;Floor to Transfer    Sit to Stand  5: Supervision;With upper extremity assist;From chair/3-in-1   chairs without armrests   Sit to Stand Details  Verbal cues for technique    Stand to Sit  5: Supervision;With upper extremity assist;To chair/3-in-1   chairs without armrests   Stand to Sit Details (indicate cue type and reason)  Verbal cues for technique    Floor to Transfer  --    Floor to Transfer Details  --      Ambulation/Gait   Ambulation/Gait  Yes    Ambulation/Gait Assistance  5: Supervision    Ambulation/Gait Assistance Details  Maneuvering around furniture.  balance loss requiring minA when multi-tasking turning to sit, carrying on conversation & turning to look at noise.     Ambulation Distance (Feet)  150 Feet    Assistive device  Rolling walker;Prosthesis    Gait Pattern  Decreased stance time - right;Decreased stride length;Decreased hip/knee flexion - right;Decreased weight shift to right;Right circumduction;Antalgic;Lateral hip instability;Trunk rotated posteriorly on right;Trunk flexed;Narrow base of support;Step-through pattern    Ambulation Surface  Indoor;Level    Stairs  Yes    Stairs Assistance  5: Supervision    Stairs Assistance  Details (indicate cue type and reason)  PT demo how to use single rail sidestepping    Stair Management Technique  Two rails;Step to pattern;Forwards;One rail Right;Sideways    Number of Stairs  4   1 rep forward 2 rails & 1 rep BUEs right rail sideways.   Height of Stairs  6    Ramp  5: Supervision   RW & prosthesis   Curb  5: Supervision   RW & prosthesis     High Level Balance   High Level Balance Activities  --    High Level Balance Comments  --      Self-Care   Self-Care  ADL's  ADL's  PT instructed in using prosthesis to enter/exit bathroom for showering. Sit on tub bench to remove prosthesis & redonne after dry off limb to exit bathroom.       Neuro Re-ed    Neuro Re-ed Details   --      Prosthetics   Prosthetic Care Comments   increase wear to 5 hrs 2x/day drying limb /liner half way of wear.  Reviewed adjusting ply socks including can change 1-2 hours after donning as prosthesis pumps fluid out that shrinker can allow over night.     Current prosthetic wear tolerance (days/week)   daily    Current prosthetic wear tolerance (#hours/day)   3 hrs 2x/day.  4/20 PT increased to 5hrs 2x/day drying half way.     Current prosthetic weight-bearing tolerance (hours/day)   No pain or discomfort with standing & gait activities.     Residual limb condition   posterior thigh wound has epithelial covering. No other areas of opening. normal color.     Education Provided  Residual limb care;Correct ply sock adjustment;Proper Donning;Proper wear schedule/adjustment;Other (comment);Skin check   see prosthetic care comments   Person(s) Educated  Patient    Education Method  Explanation;Demonstration;Tactile cues;Verbal cues    Education Method  Verbalized understanding;Needs further instruction    Donning Prosthesis  Supervision    Doffing Prosthesis  Supervision               PT Short Term Goals - 08/18/18 5638      PT SHORT TERM GOAL #1   Title  Patient verbalizes proper  cleaning & demonstrates proper donning of prosthesis. (All STGs Target Date 09/15/2018)    Time  4    Period  Weeks    Status  New    Target Date  09/15/18      PT SHORT TERM GOAL #2   Title  Patient tolerates prosthesis wear >8hrs total /day without skin issues.     Time  4    Period  Weeks    Status  New    Target Date  09/15/18      PT SHORT TERM GOAL #3   Title  Patient is able to stand 1 minute without UE support and reach 2" anteriorly & to knee level safely with supervision.     Time  4    Period  Weeks    Status  New    Target Date  09/15/18      PT SHORT TERM GOAL #4   Title  Patient ambulates 200' with RW & prosthesis with supervision.     Time  4    Period  Weeks    Status  New    Target Date  09/15/18      PT SHORT TERM GOAL #5   Title  Patient negotiates stairs with 2 rails, ramp & curb with RW & prosthesis with minimal assist.     Time  4    Period  Weeks    Status  New    Target Date  09/15/18        PT Long Term Goals - 08/18/18 0603      PT LONG TERM GOAL #1   Title  Patient verbalizes & demonstrates understanding of prosthetic care to enable safe use of prosthesis (All LTGs Target Date 11/15/2018)    Time  12    Period  Weeks    Status  New    Target Date  11/15/18  PT LONG TERM GOAL #2   Title  Patient tolerates prosthesis wear >90% of awake hours without skin or pain issues to enable function throughout her day.     Time  12    Period  Weeks    Status  New    Target Date  11/15/18      PT LONG TERM GOAL #3   Title  Berg Balance >/= 45/56 to indicate lower fall risk.     Time  12    Period  Weeks    Status  New    Target Date  11/15/18      PT LONG TERM GOAL #4   Title  Patient ambulates >500' outdoors with LRAD modified independent to enable safe function in community.     Time  12    Period  Weeks    Status  New    Target Date  11/15/18      PT LONG TERM GOAL #5   Title  Patient negotiates stairs, ramps & curbs with LRAD &  Prosthesis modified independent to enable community access.     Time  12    Period  Weeks    Status  New    Target Date  11/15/18      PT LONG TERM GOAL #6   Title  Patient ambulates 29' around furniture with cane or less & prosthesis carrying household items modified independent for household function.     Time  12    Period  Weeks    Status  New    Target Date  11/15/18            Plan - 08/28/18 1727    Clinical Impression Statement  Patient appears safe to ambulate with RW & prosthesis limited distances like in her home but needs to focus /minimize distractions. Patient is tolerating increased wear without any issues to limb. She improved her ability to negotiate ramps, curbs with RW with supervision only / no physical assist.     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 prosthetic gait working towards STGs. progress wear as appropriate    Consulted and Agree with Plan of Care  Patient       Patient will benefit from skilled therapeutic intervention in order to  improve the following deficits and impairments:  Abnormal gait, Decreased activity tolerance, Decreased balance, Decreased endurance, Decreased knowledge of use of DME, Decreased mobility, Decreased range of motion, Decreased strength, Dizziness, Increased edema, Postural dysfunction, Prosthetic Dependency, Pain  Visit Diagnosis: Abnormal posture  Muscle weakness (generalized)  Unsteadiness on feet  Other abnormalities of gait and mobility     Problem List Patient Active Problem List   Diagnosis Date Noted  . Urinary tract infection 06/30/2018  . ICH (intracerebral hemorrhage) ([facility]) 06/27/2018  . DKA (diabetic ketoacidoses) ([place]) 06/26/2018  .  V-tach ([place]) 05/14/2018  . Seizure ([place]) 05/14/2018  . Slow transit constipation   . Fall   . Labile blood glucose   . Labile blood pressure   . Chronic combined systolic and diastolic congestive heart failure ([place])   . Chronic diastolic congestive heart failure ([place])   . CKD (chronic kidney disease), stage III ([place])   . Type 2 diabetes mellitus with peripheral neuropathy (HCC)   . Amputation of right lower extremity below knee ([place]) 04/20/2018  . 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 Post-operative pain   . PAF (paroxysmal atrial fibrillation) ([place])   . Duodenal ulcer   . Atrial fibrillation with RVR ([place]) 04/14/2018  . Foot osteomyelitis, right ([place]) 04/14/2018  . Cutaneous abscess of left foot   . Acute respiratory failure with hypoxia ([place])   . Cellulitis 01/02/2018  . LGI bleed   . Acute blood loss anemia   . Wide-complex tachycardia ([place]) 07/04/2017  . Type II diabetes mellitus, uncontrolled ([place]) 07/04/2017  . Peripheral neuropathy 07/04/2017  . H/O hyperthyroidism 07/04/2017  . Atrial fibrillation with rapid ventricular response ([place]) 08/06/2016  . S/P transmetatarsal amputation of foot, right ([place]) 04/19/2016  . Obstructive sleep apnea 11/26/2015  .  Bilateral carpal tunnel syndrome 11/26/2015  . Hypomagnesemia 11/16/2015  . Hyperlipidemia LDL goal <70 10/13/2015  . Abnormality of gait 09/02/2015  . Memory loss 08/12/2015  . Diabetic peripheral neuropathy ([place]) 08/12/2015  . Vitamin D deficiency 08/12/2015  . Hyperthyroidism 04/30/2015  . Heme positive stool   . Persistent atrial fibrillation 04/29/2015  . Coronary artery disease with stable angina pectoris ([place]) 03/29/2015  . Abnormal nuclear stress test   . History of goiter 09/28/2014  . GERD (gastroesophageal reflux disease) 07/31/2013  . Depression with anxiety 06/01/2013  . DM (diabetes mellitus), type 2 with peripheral vascular complications ([place]) 22/44/9753  . HTN (hypertension) 01/30/2013    Jamey Reas PT, DPT 08/28/2018, 5:31 PM  [facility] [address] [place], [place], [address] Phone: [phone]   Fax:  [phone]  Name: Judith [name] MRN: [id number] Date of Birth: December 24, 1950

## 2018-08-28 NOTE — Patient Instructions (Signed)
Wear prosthesis 5 hours 2x/day In morning put prosthesis on limb immediately upon arising (empty bladder first if needed) Remove prosthesis & liner for morning nap, wear shrinker When wake up, put prosthesis back on limb for remainder of morning 5 hours of wear.  Remove prosthesis & liner after first 5 hrs for at least 2 hours. Wear shrinker.  Wear prosthesis & liner for 5 hours again. About midway of 5 hours, remove prosthesis & liner. Pat your leg & inside of liner dry. Put prosthesis back on immediately. DO NOT let it air dry.

## 2018-08-31 ENCOUNTER — Ambulatory Visit: Payer: Medicare PPO | Admitting: Physical Therapy

## 2018-09-05 ENCOUNTER — Ambulatory Visit: Payer: Self-pay

## 2018-09-05 ENCOUNTER — Other Ambulatory Visit: Payer: Self-pay

## 2018-09-05 ENCOUNTER — Telehealth (INDEPENDENT_AMBULATORY_CARE_PROVIDER_SITE_OTHER): Payer: Medicare PPO | Admitting: Family Medicine

## 2018-09-05 ENCOUNTER — Ambulatory Visit (INDEPENDENT_AMBULATORY_CARE_PROVIDER_SITE_OTHER): Payer: Medicare PPO | Admitting: Psychology

## 2018-09-05 DIAGNOSIS — R197 Diarrhea, unspecified: Secondary | ICD-10-CM | POA: Diagnosis not present

## 2018-09-05 DIAGNOSIS — F064 Anxiety disorder due to known physiological condition: Secondary | ICD-10-CM

## 2018-09-05 NOTE — Progress Notes (Signed)
Spoke with pt today and she states she has been having diarrhea x 4 days. She states she has no other symptoms at this time. She states the B/M is consistent and watery. She states she has tried some OTC medication but there are not working.

## 2018-09-05 NOTE — Telephone Encounter (Signed)
Pt. called to report severe diarrhea over past 4 days.  Pt. Is diabetic.  Reported 12-14 clear, watery stools with flecks of stool, over past 24 hrs.  Denied any blood in stool.  Denied fever.  Reported some chills, and attributed to the house being cold.  Denied abdominal cramps.  Stated her abdomen feels uncomfortable when she has urge to get to BR for diarrhea, then eases up.  Denied nausea or vomiting.  Has drank about 8 glasses H2O in past 24 hrs.  C/o one episode of feeling dizzy, when having stool earlier today.  Denied dizziness at this time.  Stated she feels "drained."  She thinks her urine output has decreased.  Denied eating anything spoiled; lives with sister, and reported they eat the same foods.  Does c/o loss of appetite.  Called FC.; transferred to the office for appt. to be scheduled this afternoon.  Pt. Agrees with plan.       Reason for Disposition . [1] SEVERE diarrhea (e.g., 7 or more times / day more than normal) AND [2] age > 60 years  Answer Assessment - Initial Assessment Questions 1. DIARRHEA SEVERITY: "How bad is the diarrhea?" "How many extra stools have you had in the past 24 hours than normal?"    - NO DIARRHEA (SCALE 0)   - MILD (SCALE 1-3): Few loose or mushy BMs; increase of 1-3 stools over normal daily number of stools; mild increase in ostomy output.   -  MODERATE (SCALE 4-7): Increase of 4-6 stools daily over normal; moderate increase in ostomy output. * SEVERE (SCALE 8-10; OR 'WORST POSSIBLE'): Increase of 7 or more stools daily over normal; moderate increase in ostomy output; incontinence.     12-15 over past 24 hrs 2. ONSET: "When did the diarrhea begin?"      4 days ago 3. BM CONSISTENCY: "How loose or watery is the diarrhea?"      Clear, watery with flecks of stool 4. VOMITING: "Are you also vomiting?" If so, ask: "How many times in the past 24 hours?"     No  5. ABDOMINAL PAIN: "Are you having any abdominal pain?" If yes: "What does it feel like?" (e.g.,  crampy, dull, intermittent, constant)      No 6. ABDOMINAL PAIN SEVERITY: If present, ask: "How bad is the pain?"  (e.g., Scale 1-10; mild, moderate, or severe)   - MILD (1-3): doesn't interfere with normal activities, abdomen soft and not tender to touch    - MODERATE (4-7): interferes with normal activities or awakens from sleep, tender to touch    - SEVERE (8-10): excruciating pain, doubled over, unable to do any normal activities       Denied abdominal pain; describes more of a discomfort trying to hold the stool long enough to get to the BR.  7. ORAL INTAKE: If vomiting, "Have you been able to drink liquids?" "How much fluids have you had in the past 24 hours?"     Drinks water very consistently; thinks she drank 8 glasses over past 24 hrs.  8. HYDRATION: "Any signs of dehydration?" (e.g., dry mouth [not just dry lips], too weak to stand, dizziness, new weight loss) "When did you last urinate?"     Episode of feeling dizzy this morning; denied dry mouth; urine output seems to have decreased; feels drained  9. EXPOSURE: "Have you traveled to a foreign country recently?" "Have you been exposed to anyone with diarrhea?" "Could you have eaten any food that was spoiled?"  Lives with sister; she eats the same foods; denied any spoiled foods;  10. ANTIBIOTIC USE: "Are you taking antibiotics now or have you taken antibiotics in the past 2 months?"      Denied  11. OTHER SYMPTOMS: "Do you have any other symptoms?" (e.g., fever, blood in stool)       Felt dizzy today with diarrhea stool; denied abdominal cramping, no nausea or vomiting; c/o chills; denied body aches; feeling drained 12. PREGNANCY: "Is there any chance you are pregnant?" "When was your last menstrual period?"       N/a  Protocols used: DIARRHEA-A-AH

## 2018-09-05 NOTE — Progress Notes (Signed)
Telemedicine Encounter- SOAP NOTE Established Patient  I discussed the limitations, risks, security and privacy concerns of performing an evaluation and management service by telephone and the availability of in person appointments. I also discussed with the patient that there may be a patient responsible charge related to this service. The patient expressed understanding and agreed to proceed.  This telephone encounter was conducted with the patient's  verbal consent via audio telecommunications: yes Patient was instructed to have this encounter in a suitably private space; and to only have persons present to whom they give permission to participate. In addition, patient identity was confirmed by use of name plus two identifiers (DOB and address).  I spent a total of 21min talking with the patient  cc Spoke with pt today and she states she has been having diarrhea x 4 days. She states she has no other symptoms at this time. She states the B/M is consistent and watery. She states she has tried some OTC medication but there are not working.   Subjective   Analyssa C [name] is a 68 y.o. female established patient. Telephone visit today for diarrhea.   HPI  Pt states 4 days or watery diarrhea. No other concerns-no fever, chills, nausea or vomiting. Pt states she has taken all meds daily as directed including metformin. Pt does have a well -recently checked and sister who lives with patient has not symptoms. No food changes, no take out food or outside food from others. Pt with no care for small children, sister cares for her. Pt with dry allergic cough, no SOB.RECEIVES THERAPY ONE TIME A WEEK IN MEDICAL FACILITY Patient Active Problem List   Diagnosis Date Noted  . Urinary tract infection 06/30/2018  . ICH (intracerebral hemorrhage) ([place]) 06/27/2018  . DKA (diabetic ketoacidoses) ([place]) 06/26/2018  . V-tach ([place]) 05/14/2018  . Seizure ([place]) 05/14/2018  . Slow transit constipation   . Fall    . Labile blood glucose   . Labile blood pressure   . Chronic combined systolic and diastolic congestive heart failure ([place])   . Chronic diastolic congestive heart failure ([place])   . CKD (chronic kidney disease), stage III ([place])   . Type 2 diabetes mellitus with peripheral neuropathy (HCC)   . Amputation of right lower extremity below knee ([place]) 04/20/2018  . 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 Post-operative pain   . PAF (paroxysmal atrial fibrillation) ([place])   . Duodenal ulcer   . Atrial fibrillation with RVR ([place]) 04/14/2018  . Foot osteomyelitis, right ([place]) 04/14/2018  . Cutaneous abscess of left foot   . Acute respiratory failure with hypoxia ([place])   . Cellulitis 01/02/2018  . LGI bleed   . Acute blood loss anemia   . Wide-complex tachycardia ([place]) 07/04/2017  . Type II diabetes mellitus, uncontrolled ([place]) 07/04/2017  . Peripheral neuropathy 07/04/2017  . H/O hyperthyroidism 07/04/2017  . Atrial fibrillation with rapid ventricular response ([place]) 08/06/2016  . S/P transmetatarsal amputation of foot, right ([place]) 04/19/2016  . Obstructive sleep apnea 11/26/2015  . Bilateral carpal tunnel syndrome 11/26/2015  . Hypomagnesemia 11/16/2015  . Hyperlipidemia LDL goal <70 10/13/2015  . Abnormality of gait 09/02/2015  . Memory loss 08/12/2015  . Diabetic peripheral neuropathy ([place]) 08/12/2015  . Vitamin D deficiency 08/12/2015  . Hyperthyroidism 04/30/2015  . Heme positive stool   . Persistent atrial fibrillation 04/29/2015  . Coronary artery disease with stable angina pectoris ([place]) 03/29/2015  .  Abnormal nuclear stress test   . History of goiter 09/28/2014  . GERD (gastroesophageal reflux disease) 07/31/2013  . Depression with anxiety 06/01/2013  . DM (diabetes mellitus), type 2 with peripheral vascular complications ([place]) 50/27/7412  . HTN (hypertension) 01/30/2013    Past Medical History:  Diagnosis Date   . Abnormal EKG 07/31/2013  . Arthritis    "hands" (03/06/2015)  . Asthma   . Carpal tunnel syndrome, bilateral   . Chronic kidney disease   . Complication of anesthesia    slow to wake up. Pt sts she woke up during surgery many years ago.  . Coronary artery disease     2 v CAD with CTO of the RCA and high grade bifurcational LCx/OM stenosis. S/P PCI DES x 2 to the LCx/OM.  Marland Kitchen Diabetic peripheral neuropathy ([place]) "since 1996"  . GERD (gastroesophageal reflux disease)   . Goiter   . Headache    migraines prior to menopause  . History of shingles 06/01/2013  . Hyperlipidemia LDL goal <70 10/13/2015  . Hypertension   . Hyperthyroidism   . Osteomyelitis of foot (HCC)    Right  . PAF (paroxysmal atrial fibrillation) ([place]) 04/29/2015   CHADS2VASC score of 5 now on Apixaban  . Pneumonia ~ 1976  . Sleep apnea    Bipap  . Tremors of nervous system   . Type II diabetes mellitus (HCC)    insulin dependent    Current Outpatient Medications  Medication Sig Dispense Refill  . acetaminophen (TYLENOL) 325 MG tablet Take 1-2 tablets (325-650 mg total) by mouth every 6 (six) hours as needed for mild pain (pain score 1-3 or temp > 100.5).    Marland Kitchen albuterol (PROVENTIL HFA) 108 (90 Base) MCG/ACT inhaler Inhale 1-2 puffs into the lungs every 6 (six) hours as needed for wheezing or shortness of breath. 1 Inhaler 0  . amiodarone (PACERONE) 200 MG tablet Take 1 tablet (200 mg total) by mouth daily. 30 tablet 6  . apixaban (ELIQUIS) 5 MG TABS tablet Take 1 tablet (5 mg total) by mouth 2 (two) times daily. 180 tablet 3  . atorvastatin (LIPITOR) 20 MG tablet Take 1 tablet (20 mg total) by mouth daily. 90 tablet 3  . buPROPion (WELLBUTRIN SR) 150 MG 12 hr tablet Take 1 tablet (150 mg total) by mouth 2 (two) times daily. 60 tablet 1  . diltiazem (CARDIZEM CD) 120 MG 24 hr capsule Take 1 capsule (120 mg total) by mouth daily. 90 capsule 1  . famotidine (PEPCID) 20 MG tablet Take 1 tablet (20 mg total) by mouth 2  (two) times daily. 60 tablet 3  . fenofibrate 160 MG tablet Take 1 tablet (160 mg total) by mouth daily. 30 tablet 1  . gabapentin (NEURONTIN) 300 MG capsule TAKE 3 CAPSULES BY MOUTH IN THE MORNING AND TAKE 3 CAPSULES BY MOUTH AT BEDTIME 180 capsule 0  . glucose blood (TRUE METRIX BLOOD GLUCOSE TEST) test strip Used to check blood sugars 2x daily. 100 each 12  . Magnesium 400 MG TABS Take 400 mg by mouth 2 (two) times a week.    . metFORMIN (GLUCOPHAGE) 1000 MG tablet Take 1 tablet (1,000 mg total) by mouth daily with breakfast. 30 tablet 11  . methimazole (TAPAZOLE) 10 MG tablet Take 1 tablet (10 mg total) by mouth daily. 30 tablet 11  . mupirocin ointment (BACTROBAN) 2 % Apply 1 application topically 3 (three) times daily. 22 g 1  . traMADol (ULTRAM) 50 MG tablet Take 50  mg by mouth every 6 (six) hours as needed.    . TRESIBA FLEXTOUCH 100 UNIT/ML SOPN FlexTouch Pen INJECT 20 UNITS SUBCUTANEOUSLY AT BEDTIME 15 mL 0   No current facility-administered medications for this visit.     Allergies  Allergen Reactions  . Contrast Media [Iodinated Diagnostic Agents] Hives and Other (See Comments)    Spoke to patient, Iodine allergy is really IV contrast allergy.   [name] [Insulin Aspart] Shortness Of Breath and Other (See Comments)    "breathing problems"  . Codeine Nausea And Vomiting and Other (See Comments)    HIGH DOSES-SEVERE VOMITING  . Iodine Other (See Comments)    MUST HAVE BENADRYL PRIOR TO PROCEDURE AND RIGHT BEFORE TREATMENT TO COUNTERACT REACTION-BLISTERING REACTION DERMATOLOGICAL  . Penicillins Itching, Rash and Other (See Comments)    Has patient had a PCN reaction causing immediate rash, facial/tongue/throat swelling, SOB or lightheadedness with hypotension: no Has patient had a PCN reaction causing severe rash involving mucus membranes or skin necrosis: No Has patient had a PCN reaction that required hospitalization No Has patient had a PCN reaction occurring within the last  10 years: No If all of the above answers are "NO", then may proceed with Cephalosporin use.  CHEST SIZED RASH AND ITCHING   . Ace Inhibitors Cough  . Demerol [Meperidine] Nausea And Vomiting  . Dilaudid [Hydromorphone Hcl] Other (See Comments)    HEADACHE   . Neosporin [Neomycin-Bacitracin Zn-Polymyx] Itching, Rash and Other (See Comments)    MAKES REACTIONS WORSE WHEN USING AS PROPHYLACTIC  . Percocet [Oxycodone-Acetaminophen] Rash  . Tape Itching and Rash    Social History   Socioeconomic History  . Marital status: Single    Spouse name: Not on file  . Number of children: 2  . Years of education: Masters  . Highest education level: Not on file  Occupational History  . Occupation: Landscape architect  Social Needs  . Financial resource strain: Somewhat hard  . Food insecurity:    Worry: Sometimes true    Inability: Sometimes true  . Transportation needs:    Medical: No    Non-medical: No  Tobacco Use  . Smoking status: Former Smoker    Packs/day: 0.00    Years: 41.00    Pack years: 0.00    Types: Cigarettes    Last attempt to quit: 03/05/2015    Years since quitting: 3.5  . Smokeless tobacco: Never Used  . Tobacco comment: 04/29/2015 "quit smoking cigarettes 02/27/2015"  Substance and Sexual Activity  . Alcohol use: No  . Drug use: No  . Sexual activity: Not Currently    Birth control/protection: Post-menopausal, Surgical  Lifestyle  . Physical activity:    Days per week: 0 days    Minutes per session: 0 min  . Stress: Very much  Relationships  . Social connections:    Talks on phone: More than three times a week    Gets together: More than three times a week    Attends religious service: More than 4 times per year    Active member of club or organization: No    Attends meetings of clubs or organizations: Never    Relationship status: Never married  . Intimate partner violence:    Fear of current or ex partner: No    Emotionally abused: No     Physically abused: No    Forced sexual activity: No  Other Topics Concern  . 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      Lives with sister in [place].      Tobacco: 1 ppd x 41 years - quit 2016      Alcohol: none      Drugs: none      Exercise:  Walking in neighborhood; physical job.   Right-handed.   2 cups caffeine daily.    ROS CONSTITUTIONAL: no weight loss, no fever EENT:no sinus problems, no nasal congestion CV: no chest pain RESP: no SOB, dry  cough GI: , diarrhea-watery, no blood ,NO abdominal pain, NO  difficulty swallowing, NO nausea, NO vomiting,NO blood in stool, GU: NO pain with urination, urinary frequency, urgency,  bladder problems ALLERGY:seasonal allergies     Objective   Vitals as reported by the patient: Glucose at home 105  Diarrhea, unspecified type d/w  Hold metformin for 2 days BRAT diet, increase water, avoid caffeine Imodium-otc Call if no improvement for stool studies-risk for cdif, giardia,  NO therapy this week due to weakness-fall risk-return next week if symptoms resolve  I discussed the assessment and treatment plan with the patient. The patient was provided an opportunity to ask questions and all were answered. The patient agreed with the plan and demonstrated an understanding of the instructions.   The patient was advised to call back or seek an in-person evaluation if the symptoms worsen or if the condition fails to improve as anticipated.  I provided 12 minutes of non-face-to-face time during this encounter.  Torre Pikus Hannah Beat, MD  Primary Care at [facility]

## 2018-09-06 ENCOUNTER — Telehealth: Payer: Self-pay

## 2018-09-06 ENCOUNTER — Ambulatory Visit: Payer: Medicare PPO | Admitting: Physical Therapy

## 2018-09-08 ENCOUNTER — Other Ambulatory Visit: Payer: Self-pay

## 2018-09-08 ENCOUNTER — Encounter: Payer: Self-pay | Admitting: Family Medicine

## 2018-09-08 DIAGNOSIS — R197 Diarrhea, unspecified: Secondary | ICD-10-CM

## 2018-09-12 ENCOUNTER — Encounter: Payer: Self-pay | Admitting: Physical Therapy

## 2018-09-14 ENCOUNTER — Encounter: Payer: Self-pay | Admitting: Physical Therapy

## 2018-09-14 ENCOUNTER — Other Ambulatory Visit: Payer: Self-pay | Admitting: Physician Assistant

## 2018-09-14 ENCOUNTER — Ambulatory Visit: Payer: Medicare PPO | Attending: Orthopedic Surgery | Admitting: Physical Therapy

## 2018-09-14 ENCOUNTER — Other Ambulatory Visit: Payer: Self-pay

## 2018-09-14 DIAGNOSIS — R293 Abnormal posture: Secondary | ICD-10-CM | POA: Insufficient documentation

## 2018-09-14 DIAGNOSIS — R2689 Other abnormalities of gait and mobility: Secondary | ICD-10-CM | POA: Insufficient documentation

## 2018-09-14 DIAGNOSIS — R2681 Unsteadiness on feet: Secondary | ICD-10-CM | POA: Insufficient documentation

## 2018-09-14 DIAGNOSIS — M79661 Pain in right lower leg: Secondary | ICD-10-CM | POA: Insufficient documentation

## 2018-09-14 DIAGNOSIS — M6281 Muscle weakness (generalized): Secondary | ICD-10-CM

## 2018-09-14 NOTE — Patient Instructions (Signed)
Driving options with right Below-knee amputation Option 1: remove prosthesis and cross over using left foot on gas & brake Option 2: 2-foot driving with prosthesis on gas & left foot on brake. Prosthesis is push out motion of leg not ankle movement.  Option 3: prosthesis for gas & brake. Think about heel pushing on each pedal not toes. Option 4: portable Left foot accelerator  [email or web address].org  Start practicing in large empty parking lot.  Pretend that one spot is only empty one. Pull in & out of spot from right & left side; back up, parallel park, 3 point turns, drive in circles & have sister yell "stop"   When you go back to driving on road that has little to no traffic at first.

## 2018-09-14 NOTE — Therapy (Addendum)
[facility] [address] [place], [place], [address] Phone: [phone]   Fax:  [phone]  Physical Therapy Treatment  Patient Details  Name: Judith [name] MRN: [id number] Date of Birth: 1950-10-12 Referring Provider (PT): [name], MD   Encounter Date: 09/14/2018  PT End of Session - 09/14/18 1406    Visit Number  5    Number of Visits  25    Date for PT Re-Evaluation  11/15/18    Authorization Type  [employer] & Medicaid    PT Start Time  0350    PT Stop Time  1402    PT Time Calculation (min)  47 min    Equipment Utilized During Treatment  Gait belt    Activity Tolerance  Patient tolerated treatment well;No increased pain    Behavior During Therapy  [facility] for tasks assessed/performed       CLINIC OPERATION CHANGES: [facility] is operating at a low capacity due to COVID-19.  The patient was brought into the clinic for evaluation and/or treatment following universal masking by staff, social distancing, and <10 people in the clinic.  The patient's COVID risk of complications score is 6.    Past Medical History:  Diagnosis Date  . Abnormal EKG 07/31/2013  . Arthritis    "hands" (03/06/2015)  . Asthma   . Carpal tunnel syndrome, bilateral   . Chronic kidney disease   . Complication of anesthesia    slow to wake up. Pt sts she woke up during surgery many years ago.  . Coronary artery disease     2 v CAD with CTO of the RCA and high grade bifurcational LCx/OM stenosis. S/P PCI DES x 2 to the LCx/OM.  Marland Kitchen Diabetic peripheral neuropathy ([place]) "since 1996"  . GERD (gastroesophageal reflux disease)   . Goiter   . Headache    migraines prior to menopause  . History of shingles 06/01/2013  . Hyperlipidemia LDL goal <70 10/13/2015  . Hypertension   . Hyperthyroidism   . Osteomyelitis of foot (HCC)    Right  . PAF (paroxysmal atrial fibrillation) ([place]) 04/29/2015   CHADS2VASC score  of 5 now on Apixaban  . Pneumonia ~ 1976  . Sleep apnea    Bipap  . Tremors of nervous system   . Type II diabetes mellitus (HCC)    insulin dependent    Past Surgical History:  Procedure Laterality Date  . ABDOMINAL HYSTERECTOMY  1988   age 74; CERVICAL DYSPLASIA; ovaries intact.   . AMPUTATION Right 01/23/2016   Procedure: Right 3rd Ray Amputation;  Surgeon: [name], MD;  Location: [facility];  Service: Orthopedics;  Laterality: Right;  . AMPUTATION Right 02/13/2016   Procedure: Right Transmetatarsal Amputation;  Surgeon: [name], MD;  Location: [facility];  Service: Orthopedics;  Laterality: Right;  . AMPUTATION Right 04/15/2018   Procedure: AMPUTATION BELOW KNEE;  Surgeon: [name], MD;  Location: [facility];  Service: Orthopedics;  Laterality: Right;  . BIOPSY  04/16/2018   Procedure: BIOPSY;  Surgeon: Irene Shipper, MD;  Location: [facility] ENDOSCOPY;  Service: Endoscopy;;  . CARDIAC CATHETERIZATION N/A 02/27/2015   Procedure: Left Heart Cath and Coronary Angiography;  Surgeon: [name], MD; LAD 40%, mCFX 80%, OM 70%, RCA 100% calcified       . CARDIAC CATHETERIZATION N/A 03/06/2015   Procedure: Coronary Stent Intervention;  Surgeon: [name], MD;  Location: [facility];  Service: Cardiovascular;  Laterality: N/A;  Mid CX 3.50x12 promus DES w/ 0% resdual and Prox OM1 2.50x20 promus DES w/ 20% residual  . CARDIOVERSION    . CARDIOVERSION N/A 05/22/2018   Procedure: CARDIOVERSION;  Surgeon: Skeet Latch, MD;  Location: [place];  Service: Cardiovascular;  Laterality: N/A;  . CARPAL TUNNEL RELEASE Right Nov 2015  . CARPAL TUNNEL RELEASE Right 1992; 05/2014   [name]; [place]  . CESAREAN SECTION  1982; 1984  . ESOPHAGOGASTRODUODENOSCOPY (EGD) WITH PROPOFOL N/A 04/16/2018   Procedure: ESOPHAGOGASTRODUODENOSCOPY (EGD) WITH PROPOFOL;  Surgeon: Irene Shipper, MD;  [facility] ENDOSCOPY;  Service: Endoscopy;  Laterality: N/A;  . FOOT NEUROMA SURGERY Bilateral 2000   . I&D EXTREMITY Left 01/06/2018   Procedure: DEBRIDEMENT ULCER LEFT FOOT;  Surgeon: [name], MD;  Location: [facility];  Service: Orthopedics;  Laterality: Left;  . KNEE ARTHROSCOPY Right ~ 2003   "meniscus repair"  . SHOULDER OPEN ROTATOR CUFF REPAIR Right 1996; 1998   "w/fracture repair"  . THYROID SURGERY  2000   "removed lots of nodules"  . TONSILLECTOMY  1976    There were no vitals filed for this visit.  Subjective Assessment - 09/14/18 1314    Subjective  She had diarhhea for 8 days. It cleared on 09/11/2018. No other symptoms. No falls. Stopped using walker this morning.     Pertinent History  R TTA, asthma, CAD, IDDM2, neuropathy, shingles, HTN, Left non-traumatic intracerebral hemorrhage, CKD stage 3, right shoulder impingement syndrome    Limitations  Lifting;Standing;Walking;House hold activities    Patient Stated Goals  To use prosthesis to walk in community & home, gardening, [place]    Currently in Pain?  No/denies                       [facility] Adult PT Treatment/Exercise - 09/14/18 1315      Transfers   Transfers  Sit to Stand;Stand to Sit;Floor to Transfer    Sit to Stand  5: Supervision;With upper extremity assist;From chair/3-in-1;With armrests    Sit to Stand Details  --    Stand to Sit  5: Supervision;With upper extremity assist;To chair/3-in-1;With armrests    Stand to Sit Details (indicate cue type and reason)  --      Ambulation/Gait   Ambulation/Gait  Yes    Ambulation/Gait Assistance  5: Supervision;4: Min assist   minA cane on grass   Ambulation/Gait Assistance Details  verbal & tactile cues on cane use.     Ambulation Distance (Feet)  300 Feet   100' X 3 no device indoors & 300' cane indoors/outdoors   Assistive device  Prosthesis;Straight cane    Gait Pattern  Decreased stance time - right;Decreased stride length;Decreased hip/knee flexion - right;Decreased weight shift to right;Right circumduction;Antalgic;Lateral hip  instability;Trunk rotated posteriorly on right;Trunk flexed;Narrow base of support;Step-through pattern    Ambulation Surface  Indoor;Level;Outdoor;Paved;Gravel;Grass    Stairs  Yes    Stairs Assistance  4: Min guard;5: Supervision    Stairs Assistance Details (indicate cue type and reason)  demo & verbal cues on technique for alternating pattern    Stair Management Technique  One rail Left;Step to pattern;Forwards;Two rails;Alternating pattern   single rail step-to & alternating 2 rails   Number of Stairs  4   1 rep single rail & 3 reps 2 rails alternating   Height of Stairs  6    Ramp  5: Supervision   cane & prosthesis   Ramp  Details (indicate cue type and reason)  verbal & tactile cues on technique with cane    Curb  4: Min assist;5: Supervision   cane & prosthesis   Curb Details (indicate cue type and reason)  verbal cues on technique & sequence with cane      Self-Care   Self-Care  --    ADL's  --      Prosthetics   Prosthetic Care Comments   instructed in wear of prosthesis even when sick if out of bed,  pt reports itching with shrinker that contains copper; PT recommended switching to shrinker without copper.      Current prosthetic wear tolerance (days/week)   daily    Current prosthetic wear tolerance (#hours/day)   most of awake hours    Current prosthetic weight-bearing tolerance (hours/day)   No pain or discomfort with standing & gait activities.     Residual limb condition   no open areas, normal color & temperature    Education Provided  Residual limb care;Correct ply sock adjustment;Proper Donning;Proper wear schedule/adjustment;Other (comment);Skin check   see prosthetic care comments   Person(s) Educated  Patient    Education Method  Explanation;Demonstration;Verbal cues;Tactile cues    Education Method  Verbalized understanding;Needs further instruction    Donning Prosthesis  Supervision    Doffing Prosthesis  Modified independent (device/increased time)              PT Education - 09/14/18 1400    Education Details  Upon request of pt: PT instructed in options for return to driving with TTA; walking dog with leash    Person(s) Educated  Patient    Methods  Explanation;Demonstration;Tactile cues;Verbal cues;Handout    Comprehension  Verbalized understanding;Returned demonstration;Need further instruction;Verbal cues required;Tactile cues required       PT Short Term Goals - 09/14/18 2134      PT SHORT TERM GOAL #1   Title  Patient verbalizes proper cleaning & demonstrates proper donning of prosthesis. (All STGs Target Date 09/15/2018)    Baseline  MET 09/14/2018    Time  4    Period  Weeks    Status  Achieved    Target Date  09/15/18      PT SHORT TERM GOAL #2   Title  Patient tolerates prosthesis wear >8hrs total /day without skin issues.     Baseline  MET 09/14/2018    Time  4    Period  Weeks    Status  Achieved    Target Date  09/15/18      PT SHORT TERM GOAL #3   Title  Patient is able to stand 1 minute without UE support and reach 2" anteriorly & to knee level safely with supervision.     Baseline  MET 09/14/2018    Time  4    Period  Weeks    Status  Achieved    Target Date  09/15/18      PT SHORT TERM GOAL #4   Title  Patient ambulates 200' with RW & prosthesis with supervision.     Baseline  MET 09/14/2018    Time  4    Period  Weeks    Status  Achieved    Target Date  09/15/18      PT SHORT TERM GOAL #5   Title  Patient negotiates stairs with 2 rails, ramp & curb with RW & prosthesis with minimal assist.     Baseline  MET 09/14/2018    Time  4    Period  Weeks    Status  Achieved    Target Date  09/15/18        PT Long Term Goals - 09/14/18 2135      PT LONG TERM GOAL #1   Title  Patient verbalizes & demonstrates understanding of prosthetic care to enable safe use of prosthesis (All LTGs Target Date 11/15/2018)    Time  12    Period  Weeks    Status  On-going    Target Date  11/15/18      PT LONG TERM  GOAL #2   Title  Patient tolerates prosthesis wear >90% of awake hours without skin or pain issues to enable function throughout her day.     Time  12    Period  Weeks    Status  On-going    Target Date  11/15/18      PT LONG TERM GOAL #3   Title  Berg Balance >/= 45/56 to indicate lower fall risk.     Time  12    Period  Weeks    Status  On-going    Target Date  11/15/18      PT LONG TERM GOAL #4   Title  Patient ambulates >500' outdoors with LRAD modified independent to enable safe function in community.     Time  12    Period  Weeks    Status  On-going    Target Date  11/15/18      PT LONG TERM GOAL #5   Title  Patient negotiates stairs, ramps & curbs with LRAD & Prosthesis modified independent to enable community access.     Time  12    Period  Weeks    Status  On-going    Target Date  11/15/18      PT LONG TERM GOAL #6   Title  Patient ambulates 70' around furniture with cane or less & prosthesis carrying household items modified independent for household function.     Time  12    Period  Weeks    Status  On-going    Target Date  11/15/18            Plan - 09/14/18 2139    Clinical Impression Statement  Patient seems to have better understanding how to walk her dog on leash and options to return to driving with right TTA.  She met all STGs set for first 30 days. She was able to progress prosthetic gait indoors without device and outdoors with cane.     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 prosthetic gait with cane for community /outdoors & no device indoors    Consulted and Agree with Plan of Care  Patient       Patient  will benefit from skilled therapeutic intervention in order to improve the following deficits and impairments:  Abnormal gait, Decreased activity tolerance, Decreased balance, Decreased endurance, Decreased knowledge of use of DME, Decreased mobility, Decreased range of motion, Decreased strength, Dizziness, Increased edema, Postural dysfunction, Prosthetic Dependency, Pain  Visit Diagnosis: Abnormal posture  Muscle weakness (generalized)  Unsteadiness on feet  Other abnormalities of gait and mobility  Pain in right lower leg     Problem List Patient Active Problem List   Diagnosis Date Noted  . Diarrhea 09/05/2018  . Urinary tract infection 06/30/2018  . ICH (intracerebral hemorrhage) ([place]) 06/27/2018  . DKA (diabetic ketoacidoses) ([place]) 06/26/2018  . V-tach ([place]) 05/14/2018  . Seizure ([place]) 05/14/2018  . Slow transit constipation   . Fall   . Labile blood glucose   . Labile blood pressure   . Chronic combined systolic and diastolic congestive heart failure ([place])   . Chronic diastolic congestive heart failure ([place])   . CKD (chronic kidney disease), stage III ([place])   . Type 2 diabetes mellitus with peripheral neuropathy (HCC)   . Amputation of right lower extremity below knee ([place]) 04/20/2018  . 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 Post-operative pain   . PAF (paroxysmal atrial fibrillation) ([place])   . Duodenal ulcer   . Atrial fibrillation with RVR ([place]) 04/14/2018  . Foot osteomyelitis, right ([place]) 04/14/2018  . Cutaneous abscess of left foot   . Acute respiratory failure with hypoxia ([place])   . Cellulitis 01/02/2018  . LGI bleed   . Acute blood loss anemia   . Wide-complex tachycardia ([place]) 07/04/2017  . Type II diabetes mellitus, uncontrolled ([place]) 07/04/2017  . Peripheral neuropathy 07/04/2017  . H/O hyperthyroidism 07/04/2017  . Atrial fibrillation with rapid ventricular response ([place]) 08/06/2016  . S/P transmetatarsal amputation of foot, right ([place]) 04/19/2016  . Obstructive sleep apnea 11/26/2015  . Bilateral carpal tunnel syndrome 11/26/2015  . Hypomagnesemia 11/16/2015  . Hyperlipidemia LDL goal <70 10/13/2015  . Abnormality of gait 09/02/2015  . Memory loss 08/12/2015  . Diabetic peripheral neuropathy ([place]) 08/12/2015  . Vitamin D deficiency 08/12/2015  . Hyperthyroidism 04/30/2015  . Heme positive stool   . Persistent atrial fibrillation 04/29/2015  . Coronary artery disease with stable angina pectoris ([place]) 03/29/2015  . Abnormal nuclear stress test   . History of goiter 09/28/2014  . GERD (gastroesophageal reflux disease) 07/31/2013  . Depression with anxiety 06/01/2013  . DM (diabetes mellitus), type 2 with peripheral vascular complications ([place]) 62/83/1517  . HTN (hypertension) 01/30/2013    Jamey Reas PT, DPT 09/14/2018, 9:41 PM  [facility] [address] [place], [place], [address] Phone: [phone]   Fax:  [phone]  Name: ELERI RUBEN MRN: [id number] Date of Birth: November 21, 1950

## 2018-09-15 ENCOUNTER — Telehealth: Payer: Self-pay

## 2018-09-15 ENCOUNTER — Other Ambulatory Visit: Payer: Self-pay | Admitting: Cardiology

## 2018-09-15 ENCOUNTER — Other Ambulatory Visit: Payer: Self-pay | Admitting: Physician Assistant

## 2018-09-15 ENCOUNTER — Other Ambulatory Visit: Payer: Self-pay | Admitting: Endocrinology

## 2018-09-15 DIAGNOSIS — G4733 Obstructive sleep apnea (adult) (pediatric): Secondary | ICD-10-CM | POA: Diagnosis not present

## 2018-09-15 DIAGNOSIS — E785 Hyperlipidemia, unspecified: Secondary | ICD-10-CM

## 2018-09-15 DIAGNOSIS — I2510 Atherosclerotic heart disease of native coronary artery without angina pectoris: Secondary | ICD-10-CM

## 2018-09-15 NOTE — Telephone Encounter (Signed)
Left message regarding appt on 09/18/18. 

## 2018-09-18 ENCOUNTER — Telehealth: Payer: Self-pay | Admitting: Emergency Medicine

## 2018-09-18 ENCOUNTER — Telehealth (INDEPENDENT_AMBULATORY_CARE_PROVIDER_SITE_OTHER): Payer: Medicare PPO | Admitting: Internal Medicine

## 2018-09-18 ENCOUNTER — Ambulatory Visit: Payer: Self-pay | Admitting: *Deleted

## 2018-09-18 VITALS — BP 129/83

## 2018-09-18 DIAGNOSIS — I4819 Other persistent atrial fibrillation: Secondary | ICD-10-CM

## 2018-09-18 DIAGNOSIS — I10 Essential (primary) hypertension: Secondary | ICD-10-CM

## 2018-09-18 NOTE — Progress Notes (Signed)
Electrophysiology TeleHealth Note   Due to national recommendations of social distancing due to COVID 19, an audio/video telehealth visit is felt to be most appropriate for this patient at this time.  See MyChart message from today for the patient's consent to telehealth for [facility].   Date:  09/18/2018   ID:  Judith [name], DOB 07-21-1950, MRN [id number]  Location: patient's home  Provider location: [place], [place]  Evaluation Performed: Follow-up visit  PCP:  Rutherford Guys, MD  Cardiologist:  Fransico Him, MD Electrophysiologist:  Dr Rayann Heman  Chief Complaint:  afib  History of Present Illness:    Judith [name] is a 68 y.o. female who presents via audio/video conferencing for a telehealth visit today.  She was most recently hospitalized in February at [facility] for DKA and incidental L thalamic ICH.  Since that time, she has made good recovery.  She feels that her AF is well controlled.  She is pleased with current health state.  Today, she denies symptoms of palpitations, chest pain, shortness of breath,  lower extremity edema, dizziness, presyncope, or syncope.  The patient is otherwise without complaint today.  The patient denies symptoms of fevers, chills, cough, or new SOB worrisome for COVID 19.  Past Medical History:  Diagnosis Date   Abnormal EKG 07/31/2013   Arthritis    "hands" (03/06/2015)   Asthma    Carpal tunnel syndrome, bilateral    Chronic kidney disease    Complication of anesthesia    slow to wake up. Pt sts she woke up during surgery many years ago.   Coronary artery disease     2 v CAD with CTO of the RCA and high grade bifurcational LCx/OM stenosis. S/P PCI DES x 2 to the LCx/OM.   Diabetic peripheral neuropathy ([place]) "since 1996"   GERD (gastroesophageal reflux disease)    Goiter    Headache    migraines prior to menopause   History of shingles 06/01/2013   Hyperlipidemia LDL goal <70 10/13/2015   Hypertension     Hyperthyroidism    Osteomyelitis of foot (HCC)    Right   PAF (paroxysmal atrial fibrillation) ([place]) 04/29/2015   CHADS2VASC score of 5 now on Apixaban   Pneumonia ~ 1976   Sleep apnea    Bipap   Tremors of nervous system    Type II diabetes mellitus (HCC)    insulin dependent    Past Surgical History:  Procedure Laterality Date   ABDOMINAL HYSTERECTOMY  35   age 35; CERVICAL DYSPLASIA; ovaries intact.    AMPUTATION Right 01/23/2016   Procedure: Right 3rd Ray Amputation;  Surgeon: [name], MD;  Location: [place];  Service: Orthopedics;  Laterality: Right;   AMPUTATION Right 02/13/2016   Procedure: Right Transmetatarsal Amputation;  Surgeon: [name], MD;  Location: [place];  Service: Orthopedics;  Laterality: Right;   AMPUTATION Right 04/15/2018   Procedure: AMPUTATION BELOW KNEE;  Surgeon: [name], MD;  Location: [place];  Service: Orthopedics;  Laterality: Right;   BIOPSY  04/16/2018   Procedure: BIOPSY;  Surgeon: Irene Shipper, MD;  Location: [facility] ENDOSCOPY;  Service: Endoscopy;;   CARDIAC CATHETERIZATION N/A 02/27/2015   Procedure: Left Heart Cath and Coronary Angiography;  Surgeon: [name], MD; LAD 40%, mCFX 80%, OM 70%, RCA 100% calcified        CARDIAC CATHETERIZATION N/A 03/06/2015   Procedure: Coronary Stent Intervention;  Surgeon: [name], MD;  Location: [facility] INVASIVE CV  LAB;  Service: Cardiovascular;  Laterality: N/A;  Mid CX 3.50x12 promus DES w/ 0% resdual and Prox OM1 2.50x20 promus DES w/ 20% residual   CARDIOVERSION     CARDIOVERSION N/A 05/22/2018   Procedure: CARDIOVERSION;  Surgeon: Skeet Latch, MD;  Location: [place];  Service: Cardiovascular;  Laterality: N/A;   CARPAL TUNNEL RELEASE Right Nov 2015   CARPAL TUNNEL RELEASE Right 1992; 05/2014   [name]; [place]; 1984   ESOPHAGOGASTRODUODENOSCOPY (EGD) WITH PROPOFOL N/A 04/16/2018   Procedure: ESOPHAGOGASTRODUODENOSCOPY (EGD) WITH  PROPOFOL;  Surgeon: Irene Shipper, MD;  Location: [facility] ENDOSCOPY;  Service: Endoscopy;  Laterality: N/A;   FOOT NEUROMA SURGERY Bilateral 2000   I&D EXTREMITY Left 01/06/2018   Procedure: DEBRIDEMENT ULCER LEFT FOOT;  Surgeon: [name], MD;  Location: [place];  Service: Orthopedics;  Laterality: Left;   KNEE ARTHROSCOPY Right ~ 2003   "meniscus repair"   SHOULDER OPEN ROTATOR CUFF REPAIR Right 1996; 1998   "w/fracture repair"   THYROID SURGERY  2000   "removed lots of nodules"   TONSILLECTOMY  1976    Current Outpatient Medications  Medication Sig Dispense Refill   acetaminophen (TYLENOL) 325 MG tablet Take 1-2 tablets (325-650 mg total) by mouth every 6 (six) hours as needed for mild pain (pain score 1-3 or temp > 100.5).     albuterol (PROVENTIL HFA) 108 (90 Base) MCG/ACT inhaler Inhale 1-2 puffs into the lungs every 6 (six) hours as needed for wheezing or shortness of breath. 1 Inhaler 0   amiodarone (PACERONE) 200 MG tablet Take 1 tablet (200 mg total) by mouth daily. 30 tablet 6   apixaban (ELIQUIS) 5 MG TABS tablet Take 1 tablet (5 mg total) by mouth 2 (two) times daily. 180 tablet 3   atorvastatin (LIPITOR) 20 MG tablet Take 1 tablet (20 mg total) by mouth daily. 90 tablet 3   buPROPion (WELLBUTRIN SR) 150 MG 12 hr tablet Take 1 tablet (150 mg total) by mouth 2 (two) times daily. 60 tablet 1   diltiazem (CARDIZEM CD) 120 MG 24 hr capsule Take 1 capsule (120 mg total) by mouth daily. 90 capsule 1   famotidine (PEPCID) 20 MG tablet Take 1 tablet (20 mg total) by mouth 2 (two) times daily. 60 tablet 3   fenofibrate 160 MG tablet Take 1 tablet (160 mg total) by mouth daily. 30 tablet 1   FLUoxetine (PROZAC) 20 MG capsule TAKE 1 CAPSULE AT BEDTIME 90 capsule 1   gabapentin (NEURONTIN) 300 MG capsule TAKE 3 CAPSULES BY MOUTH IN THE MORNING AND TAKE 3 CAPSULES BY MOUTH AT BEDTIME 180 capsule 0   Magnesium 400 MG TABS Take 400 mg by mouth 2 (two) times a week.      metFORMIN (GLUCOPHAGE) 1000 MG tablet Take 1 tablet (1,000 mg total) by mouth daily with breakfast. 30 tablet 11   metFORMIN (GLUCOPHAGE-XR) 500 MG 24 hr tablet TAKE 4 TABLETS (2,000 MG TOTAL) BY MOUTH DAILY WITH BREAKFAST. 360 tablet 3   methimazole (TAPAZOLE) 10 MG tablet Take 1 tablet (10 mg total) by mouth daily. 30 tablet 11   mupirocin ointment (BACTROBAN) 2 % Apply 1 application topically 3 (three) times daily. 22 g 1   traMADol (ULTRAM) 50 MG tablet Take 50 mg by mouth every 6 (six) hours as needed.     TRESIBA FLEXTOUCH 100 UNIT/ML SOPN FlexTouch Pen INJECT 20 UNITS SUBCUTANEOUSLY AT BEDTIME 15 mL 0   TRUE METRIX BLOOD GLUCOSE  TEST test strip TEST BLOOD SUGAR TWICE DAILY 100 each 3   No current facility-administered medications for this visit.     Allergies:   Contrast media [iodinated diagnostic agents]; Novolog [insulin aspart]; Codeine; Iodine; Penicillins; Ace inhibitors; Demerol [meperidine]; Dilaudid [hydromorphone hcl]; Neosporin [neomycin-bacitracin zn-polymyx]; Percocet [oxycodone-acetaminophen]; and Tape   Social History:  The patient  reports that she quit smoking about 3 years ago. Her smoking use included cigarettes. She smoked 0.00 packs per day for 41.00 years. She has never used smokeless tobacco. She reports that she does not drink alcohol or use drugs.   Family History:  The patient's  family history includes Allergies in her sister; Breast cancer in her maternal grandmother and mother; COPD in her father; Cancer (age of onset: 52) in her mother; Emphysema in her maternal grandfather and paternal grandfather; Heart attack in her father; Heart disease (age of onset: 7) in her father; Hypertension in her father; Leukemia in her paternal grandmother; Lung cancer in her mother; Parkinson's disease in her father.   ROS:  Please see the history of present illness.   All other systems are personally reviewed and negative.    Exam:    Vital Signs:  There were no vitals  taken for this visit.  Well appearing, alert and conversant, regular work of breathing,  good skin color Eyes- anicteric, neuro- grossly intact, skin- no apparent rash or lesions or cyanosis, mouth- oral mucosa is pink   Labs/Other Tests and Data Reviewed:    Recent Labs: 05/15/2018: TSH 1.013 07/07/2018: ALT 28; BUN 22; Creatinine, Ser 1.08; Hemoglobin 10.5; Magnesium 2.0; Platelets 490; Potassium 5.0; Sodium 138   Wt Readings from Last 3 Encounters:  07/17/18 150 lb (68 kg)  07/07/18 153 lb (69.4 kg)  06/21/18 153 lb 6.4 oz (69.6 kg)     Other studies personally reviewed: Additional studies/ records that were reviewed today include: my prior office note, AF clinic notes, records from [place]   I have asked that she send [employer] strips to me   ASSESSMENT & PLAN:    1.  Persistent afib Doing well with amiodarone after failing sotalol On eliquis, small thalamic ICH noted in February.  She has done well with eliquis since that time.  Avoid dual anticoagulants in the future  2. HTN Stable No change required today  3. CAD No ischemic symptoms No changes  4. COVID 19 screen The patient denies symptoms of COVID 19 at this time.  The importance of social distancing was discussed today.  Follow-up:  AF clinic in 2 months  Current medicines are reviewed at length with the patient today.   The patient has concerns regarding her medicines.  The following changes were made today:  none  Labs/ tests ordered today include:  No orders of the defined types were placed in this encounter.   Patient Risk:  after full review of this patients clinical status, I feel that they are at moderate risk at this time.  Today, I have spent 20 minutes with the patient with telehealth technology discussing afib .    Army Fossa, MD  09/18/2018 8:44 AM     [facility] [address] [place]  [address] [phone] (office) [phone] (fax)

## 2018-09-18 NOTE — Telephone Encounter (Signed)
Pt left message on COVID voicemail 09/15/2018 at 1638 staging that she has not been feeling well for the past few weeks, and she has mouth ulcers; the pt says that she would like to find out about having a test mailed to her home;attempted to contact pt regarding her concerns; left message on voicemail for pt to call office

## 2018-09-18 NOTE — Telephone Encounter (Signed)
Contacted pt regarding her symptoms; she says that today she has been experiencing diarrhea today; mouth sores along the inside of her mouth; they have been there at least a week; she is also having chills at night, and a cough that she thinks is related to allergies; the pt answers "no" to all questions on the [facility] evaluation tool; her diarrhea is the worst of her symptoms and has been 5 times in the past 24 hours; she has taken immodium ( 2 tablets at 0200, and a second dose of 1 tablet at 0300); the pt says that she had 2 stools since she took the medication; she says her stools are very loose, and "smell horrible";  Recommendations made per nurse triage protocol; pt transferred to [facility] for scheduling; pt normally seen by Dr Pamella Pert.  Reason for Disposition . [1] MODERATE diarrhea (e.g., 4-6 times / day more than normal) AND [2] age > 16 years . COVID-19 Testing, questions about  Answer Assessment - Initial Assessment Questions 1. DIARRHEA SEVERITY: "How bad is the diarrhea?" "How many extra stools have you had in the past 24 hours than normal?"    - NO DIARRHEA (SCALE 0)   - MILD (SCALE 1-3): Few loose or mushy BMs; increase of 1-3 stools over normal daily number of stools; mild increase in ostomy output.   -  MODERATE (SCALE 4-7): Increase of 4-6 stools daily over normal; moderate increase in ostomy output. * SEVERE (SCALE 8-10; OR 'WORST POSSIBLE'): Increase of 7 or more stools daily over normal; moderate increase in ostomy output; incontinence.     moderate 2. ONSET: "When did the diarrhea begin?"     09/18/2018 3. BM CONSISTENCY: "How loose or watery is the diarrhea?"     loose 4. VOMITING: "Are you also vomiting?" If so, ask: "How many times in the past 24 hours?"     no 5. ABDOMINAL PAIN: "Are you having any abdominal pain?" If yes: "What does it feel like?" (e.g., crampy, dull, intermittent, constant)      no 6. ABDOMINAL PAIN SEVERITY: If present, ask: "How  bad is the pain?"  (e.g., Scale 1-10; mild, moderate, or severe)   - MILD (1-3): doesn't interfere with normal activities, abdomen soft and not tender to touch    - MODERATE (4-7): interferes with normal activities or awakens from sleep, tender to touch    - SEVERE (8-10): excruciating pain, doubled over, unable to do any normal activities      no 7. ORAL INTAKE: If vomiting, "Have you been able to drink liquids?" "How much fluids have you had in the past 24 hours?"      8. HYDRATION: "Any signs of dehydration?" (e.g., dry mouth [not just dry lips], too weak to stand, dizziness, new weight loss) "When did you last urinate?"     no 9. EXPOSURE: "Have you traveled to a foreign country recently?" "Have you been exposed to anyone with diarrhea?" "Could you have eaten any food that was spoiled?"    no 10. ANTIBIOTIC USE: "Are you taking antibiotics now or have you taken antibiotics in the past 2 months?"       *No Answer* 11. OTHER SYMPTOMS: "Do you have any other symptoms?" (e.g., fever, blood in stool)       Ulcers in mouth 12. PREGNANCY: "Is there any chance you are pregnant?" "When was your last menstrual period?"       no  Protocols used: [place], CORONAVIRUS (COVID-19) DIAGNOSED OR SUSPECTED-A-AH

## 2018-09-18 NOTE — Telephone Encounter (Signed)
Noted  

## 2018-09-18 NOTE — Telephone Encounter (Signed)
Left message on voicemail for pt to contact office

## 2018-09-19 ENCOUNTER — Encounter: Payer: Self-pay | Admitting: Physical Therapy

## 2018-09-20 ENCOUNTER — Other Ambulatory Visit: Payer: Self-pay

## 2018-09-20 ENCOUNTER — Encounter: Payer: Self-pay | Admitting: Physical Therapy

## 2018-09-20 ENCOUNTER — Ambulatory Visit: Payer: Medicare PPO | Admitting: Physical Therapy

## 2018-09-20 DIAGNOSIS — M6281 Muscle weakness (generalized): Secondary | ICD-10-CM | POA: Diagnosis not present

## 2018-09-20 DIAGNOSIS — M79661 Pain in right lower leg: Secondary | ICD-10-CM | POA: Diagnosis not present

## 2018-09-20 DIAGNOSIS — R2681 Unsteadiness on feet: Secondary | ICD-10-CM | POA: Diagnosis not present

## 2018-09-20 DIAGNOSIS — R293 Abnormal posture: Secondary | ICD-10-CM

## 2018-09-20 DIAGNOSIS — R2689 Other abnormalities of gait and mobility: Secondary | ICD-10-CM | POA: Diagnosis not present

## 2018-09-20 NOTE — Patient Instructions (Signed)
Pt is okay to go up/down basement stairs with both rails. Do not carry anything up or down as yet. She is okay to toss clothes down to load in washer, switch them to the dryer and help fold them once they are brought upstairs.   To check fit of socket: when standing with weight on prosthesis if your knee cap (patella) is low (below the indention) you are too low in socket and need more sock ply's. If the knee cap is high above the indention ou have too many socks on. It should be resting just above the indented part of the socket. You should also not feel any discomfort with standing on the prosthesis. If you do, adjust your sock ply until the prosthesis is comfortable again.

## 2018-09-20 NOTE — Telephone Encounter (Signed)
Needs telemed visit

## 2018-09-21 ENCOUNTER — Encounter: Payer: Self-pay | Admitting: Physical Therapy

## 2018-09-21 ENCOUNTER — Other Ambulatory Visit: Payer: Self-pay | Admitting: Cardiology

## 2018-09-21 DIAGNOSIS — Z0189 Encounter for other specified special examinations: Secondary | ICD-10-CM | POA: Diagnosis not present

## 2018-09-23 NOTE — Therapy (Signed)
[facility] [address] [place], [place], [address] Phone: [phone]   Fax:  [phone]  Physical Therapy Treatment  Patient Details  Name: Judith [name] MRN: [id number] Date of Birth: Dec 15, 1950 Referring Provider (PT): [name], MD   Encounter Date: 09/20/2018  CLINIC OPERATION CHANGES: [facility] is operating at a low capacity due to COVID-19.  The patient was brought into the clinic for evaluation and/or treatment following universal masking by staff, social distancing, and <10 people in the clinic.  The patient's COVID risk of complications score is 6.    09/20/18 1323  PT Visits / Re-Eval  Visit Number 6  Number of Visits 25  Date for PT Re-Evaluation 11/15/18  Authorization  Authorization Type [employer]  PT Time Calculation  PT Start Time 1319  PT Stop Time 1403  PT Time Calculation (min) 44 min  PT - End of Session  Equipment Utilized During Treatment Gait belt  Activity Tolerance Patient tolerated treatment well;No increased pain  Behavior During Therapy WFL for tasks assessed/performed    Past Medical History:  Diagnosis Date  . Abnormal EKG 07/31/2013  . Arthritis    "hands" (03/06/2015)  . Asthma   . Carpal tunnel syndrome, bilateral   . Chronic kidney disease   . Complication of anesthesia    slow to wake up. Pt sts she woke up during surgery many years ago.  . Coronary artery disease     2 v CAD with CTO of the RCA and high grade bifurcational LCx/OM stenosis. S/P PCI DES x 2 to the LCx/OM.  Marland Kitchen Diabetic peripheral neuropathy ([place]) "since 1996"  . GERD (gastroesophageal reflux disease)   . Goiter   . Headache    migraines prior to menopause  . History of shingles 06/01/2013  . Hyperlipidemia LDL goal <70 10/13/2015  . Hypertension   . Hyperthyroidism   . Osteomyelitis of foot (HCC)    Right  . PAF (paroxysmal atrial fibrillation) ([place])  04/29/2015   CHADS2VASC score of 5 now on Apixaban  . Pneumonia ~ 1976  . Sleep apnea    Bipap  . Tremors of nervous system   . Type II diabetes mellitus (HCC)    insulin dependent    Past Surgical History:  Procedure Laterality Date  . ABDOMINAL HYSTERECTOMY  1988   age 68; CERVICAL DYSPLASIA; ovaries intact.   . AMPUTATION Right 01/23/2016   Procedure: Right 3rd Ray Amputation;  Surgeon: [name], MD;  Location: [facility];  Service: Orthopedics;  Laterality: Right;  . AMPUTATION Right 02/13/2016   Procedure: Right Transmetatarsal Amputation;  Surgeon: [name], MD;  Location: [facility];  Service: Orthopedics;  Laterality: Right;  . AMPUTATION Right 04/15/2018   Procedure: AMPUTATION BELOW KNEE;  Surgeon: [name], MD;  Location: [facility];  Service: Orthopedics;  Laterality: Right;  . BIOPSY  04/16/2018   Procedure: BIOPSY;  Surgeon: Irene Shipper, MD;  Location: [facility] ENDOSCOPY;  Service: Endoscopy;;  . CARDIAC CATHETERIZATION N/A 02/27/2015   Procedure: Left Heart Cath and Coronary Angiography;  Surgeon: [name], MD; LAD 40%, mCFX 80%, OM 70%, RCA 100% calcified       . CARDIAC CATHETERIZATION N/A 03/06/2015   Procedure: Coronary Stent Intervention;  Surgeon: [name], MD;  Location: [facility];  Service: Cardiovascular;  Laterality: N/A;  Mid CX 3.50x12 promus DES w/ 0% resdual and Prox OM1 2.50x20 promus DES w/ 20% residual  .  CARDIOVERSION    . CARDIOVERSION N/A 05/22/2018   Procedure: CARDIOVERSION;  Surgeon: Skeet Latch, MD;  Location: [facility];  Service: Cardiovascular;  Laterality: N/A;  . CARPAL TUNNEL RELEASE Right Nov 2015  . CARPAL TUNNEL RELEASE Right 1992; 05/2014   [name];   . CESAREAN SECTION  1982; 1984  . ESOPHAGOGASTRODUODENOSCOPY (EGD) WITH PROPOFOL N/A 04/16/2018   Procedure: ESOPHAGOGASTRODUODENOSCOPY (EGD) WITH PROPOFOL;  Surgeon: Irene Shipper, MD;  Location: [facility] ENDOSCOPY;  Service: Endoscopy;  Laterality: N/A;  . FOOT  NEUROMA SURGERY Bilateral 2000  . I&D EXTREMITY Left 01/06/2018   Procedure: DEBRIDEMENT ULCER LEFT FOOT;  Surgeon: [name], MD;  Location: [facility];  Service: Orthopedics;  Laterality: Left;  . KNEE ARTHROSCOPY Right ~ 2003   "meniscus repair"  . SHOULDER OPEN ROTATOR CUFF REPAIR Right 1996; 1998   "w/fracture repair"  . THYROID SURGERY  2000   "removed lots of nodules"  . TONSILLECTOMY  1976    There were no vitals filed for this visit.     09/20/18 1322  Symptoms/Limitations  Subjective To clinic with prosthesis only. No falls. Has new liner and rash has cleared up on limb. No pain. Has not tried walking the dog or driving at this time.   Pertinent History R TTA, asthma, CAD, IDDM2, neuropathy, shingles, HTN, Left non-traumatic intracerebral hemorrhage, CKD stage 3, right shoulder impingement syndrome  Limitations Lifting;Standing;Walking;House hold activities  Patient Stated Goals To use prosthesis to walk in community & home, gardening, [place]  Pain Assessment  Currently in Pain? No/denies       09/20/18 1325  Transfers  Transfers Sit to Stand;Stand to Sit  Sit to Stand 5: Supervision;With upper extremity assist;From chair/3-in-1;With armrests  Stand to Sit 5: Supervision;With upper extremity assist;To chair/3-in-1;With armrests  Ambulation/Gait  Ambulation/Gait Yes  Ambulation/Gait Assistance 4: Min guard;5: Supervision  Ambulation Distance (Feet)  (into/out of/around gym with activity)  Assistive device Prosthesis;None  Gait Pattern Step-through pattern;Decreased step length - left;Decreased stance time - right;Narrow base of support  Ambulation Surface Level;Indoor  Stairs Yes  Stairs Assistance 4: Min guard;5: Supervision  Stairs Assistance Details (indicate cue type and reason) reminder cues for prosthetic foot position with descending stairs.   Stair Management Technique Two rails;Alternating pattern;Forwards  Number of Stairs 4 (x5 reps)   Height of Stairs 6  Self-Care  Self-Care ADL's  ADL's in ADL kitchen- worked on simulation of loading clothes from ground into washer, from washer to dryer, dryer to basket. also setting up and breaking down iron board after "lifting" off wall. supervision for all tasks done.   Neuro Re-ed   Neuro Re-ed Details  for balance reaction training: ant/post direction of balance board- alternating UE raises, bil UE raises. hands hovering above bars for EC 30 sec's x 3 reps. min guard to min assist for balance.   Prosthetics  Prosthetic Care Comments  has a shrinker without copper now.   Current prosthetic wear tolerance (days/week)  daily  Current prosthetic wear tolerance (#hours/day)  most of awake hours  Residual limb condition  intact per pt report.   Education Provided Residual limb care;Correct ply sock adjustment  Person(s) Educated Patient  Education Method Explanation;Demonstration;Verbal cues  Education Method Verbalized understanding;Returned demonstration;Verbal cues required;Needs further instruction  Donning Prosthesis 5  Doffing Prosthesis 6       09/20/18 2025  PT Education  Education Details written education on sock ply management. written okay for pt to use stairs at home  as her sister has not been letting her.   Person(s) Educated Patient  Methods Explanation;Demonstration;Verbal cues;Handout  Comprehension Verbalized understanding;Returned demonstration        PT Short Term Goals - 09/14/18 2134      PT SHORT TERM GOAL #1   Title  Patient verbalizes proper cleaning & demonstrates proper donning of prosthesis. (All STGs Target Date 09/15/2018)    Baseline  MET 09/14/2018    Time  4    Period  Weeks    Status  Achieved    Target Date  09/15/18      PT SHORT TERM GOAL #2   Title  Patient tolerates prosthesis wear >8hrs total /day without skin issues.     Baseline  MET 09/14/2018    Time  4    Period  Weeks    Status  Achieved    Target Date  09/15/18      PT  SHORT TERM GOAL #3   Title  Patient is able to stand 1 minute without UE support and reach 2" anteriorly & to knee level safely with supervision.     Baseline  MET 09/14/2018    Time  4    Period  Weeks    Status  Achieved    Target Date  09/15/18      PT SHORT TERM GOAL #4   Title  Patient ambulates 200' with RW & prosthesis with supervision.     Baseline  MET 09/14/2018    Time  4    Period  Weeks    Status  Achieved    Target Date  09/15/18      PT SHORT TERM GOAL #5   Title  Patient negotiates stairs with 2 rails, ramp & curb with RW & prosthesis with minimal assist.     Baseline  MET 09/14/2018    Time  4    Period  Weeks    Status  Achieved    Target Date  09/15/18        PT Long Term Goals - 09/14/18 2135      PT LONG TERM GOAL #1   Title  Patient verbalizes & demonstrates understanding of prosthetic care to enable safe use of prosthesis (All LTGs Target Date 11/15/2018)    Time  12    Period  Weeks    Status  On-going    Target Date  11/15/18      PT LONG TERM GOAL #2   Title  Patient tolerates prosthesis wear >90% of awake hours without skin or pain issues to enable function throughout her day.     Time  12    Period  Weeks    Status  On-going    Target Date  11/15/18      PT LONG TERM GOAL #3   Title  Berg Balance >/= 45/56 to indicate lower fall risk.     Time  12    Period  Weeks    Status  On-going    Target Date  11/15/18      PT LONG TERM GOAL #4   Title  Patient ambulates >500' outdoors with LRAD modified independent to enable safe function in community.     Time  12    Period  Weeks    Status  On-going    Target Date  11/15/18      PT LONG TERM GOAL #5   Title  Patient negotiates stairs, ramps & curbs with LRAD & Prosthesis  modified independent to enable community access.     Time  12    Period  Weeks    Status  On-going    Target Date  11/15/18      PT LONG TERM GOAL #6   Title  Patient ambulates 56' around furniture with cane or less &  prosthesis carrying household items modified independent for household function.     Time  12    Period  Weeks    Status  On-going    Target Date  11/15/18         09/20/18 1324  Plan  Clinical Impression Statement Today's skilled session continued to focus on prosthetic education and use of prosthesis with mobility. Also began to address balance reactions. The pt is progressing well toward goals and should benefit from continued PT to progress toward unmet goals.   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)  Pt will benefit from skilled therapeutic intervention in order to improve on the following deficits Abnormal gait;Decreased activity tolerance;Decreased balance;Decreased endurance;Decreased knowledge of use of DME;Decreased mobility;Decreased range of motion;Decreased strength;Dizziness;Increased edema;Postural dysfunction;Prosthetic Dependency;Pain  Stability/Clinical Decision Making Evolving/Moderate complexity  Rehab Potential Good  PT Frequency 2x / week  PT Duration 12 weeks  PT Treatment/Interventions ADLs/Self Care Home Management;Canalith Repostioning;Moist Heat;DME Instruction;Gait training;Stair training;Functional mobility training;Therapeutic activities;Therapeutic exercise;Balance training;Neuromuscular re-education;Patient/family education;Prosthetic Training;Taping;Vestibular  PT Next Visit Plan balance training to facilitate ankle/residual limb, hip & step strategies. prosthetic gait with cane for community /outdoors & no device indoors  Consulted and Agree with Plan of  Care Patient          Patient will benefit from skilled therapeutic intervention in order to improve the following deficits and impairments:  Abnormal gait, Decreased activity tolerance, Decreased balance, Decreased endurance, Decreased knowledge of use of DME, Decreased mobility, Decreased range of motion, Decreased strength, Dizziness, Increased edema, Postural dysfunction, Prosthetic Dependency, Pain  Visit Diagnosis: Abnormal posture  Muscle weakness (generalized)  Unsteadiness on feet  Other abnormalities of gait and mobility     Problem List Patient Active Problem List   Diagnosis Date Noted  . Diarrhea 09/05/2018  . Urinary tract infection 06/30/2018  . ICH (intracerebral hemorrhage) ([place]) 06/27/2018  . DKA (diabetic ketoacidoses) ([place]) 06/26/2018  . V-tach ([place]) 05/14/2018  . Seizure ([place]) 05/14/2018  . Slow transit constipation   . Fall   . Labile blood glucose   . Labile blood pressure   . Chronic combined systolic and diastolic congestive heart failure ([place])   . Chronic diastolic congestive heart failure ([place])   . CKD (chronic kidney disease), stage III ([place])   . Type 2 diabetes mellitus with peripheral neuropathy (HCC)   . Amputation of right lower extremity below knee ([place]) 04/20/2018  . 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 Post-operative pain   . PAF (paroxysmal atrial fibrillation) ([place])   . Duodenal ulcer   . Atrial fibrillation with RVR ([place]) 04/14/2018  . Foot osteomyelitis, right ([place]) 04/14/2018  . Cutaneous abscess of left foot   . Acute respiratory failure with hypoxia ([place])   . Cellulitis 01/02/2018  . LGI bleed   . Acute blood loss anemia   . Wide-complex tachycardia ([place]) 07/04/2017  . Type II diabetes mellitus, uncontrolled ([place])  07/04/2017  . Peripheral neuropathy 07/04/2017  . H/O hyperthyroidism 07/04/2017  . Atrial fibrillation with rapid ventricular response ([place])  08/06/2016  . S/P transmetatarsal amputation of foot, right ([place]) 04/19/2016  . Obstructive sleep apnea 11/26/2015  . Bilateral carpal tunnel syndrome 11/26/2015  . Hypomagnesemia 11/16/2015  . Hyperlipidemia LDL goal <70 10/13/2015  . Abnormality of gait 09/02/2015  . Memory loss 08/12/2015  . Diabetic peripheral neuropathy ([place]) 08/12/2015  . Vitamin D deficiency 08/12/2015  . Hyperthyroidism 04/30/2015  . Heme positive stool   . Persistent atrial fibrillation 04/29/2015  . Coronary artery disease with stable angina pectoris ([place]) 03/29/2015  . Abnormal nuclear stress test   . History of goiter 09/28/2014  . GERD (gastroesophageal reflux disease) 07/31/2013  . Depression with anxiety 06/01/2013  . DM (diabetes mellitus), type 2 with peripheral vascular complications ([place]) 94/17/4081  . HTN (hypertension) 01/30/2013    Willow Ora, PTA, [facility] [address], [place], [place] [address] [phone] 09/23/18, 8:15 PM   Name: Judith [name] MRN: [id number] Date of Birth: Oct 10, 1950

## 2018-09-25 ENCOUNTER — Other Ambulatory Visit: Payer: Self-pay | Admitting: Endocrinology

## 2018-09-25 ENCOUNTER — Telehealth: Payer: Self-pay

## 2018-09-25 NOTE — Telephone Encounter (Signed)
Spoke with the patient and she has given verbal consent to do a doxy.me visit. She mentioned that she has a therapy appt next door at 10am. She wanted to know would she able to come in for an office visit. Please advise

## 2018-09-25 NOTE — Telephone Encounter (Signed)
If she has a capability to do a virtual visit we prefer that at this time.  However if not we can schedule an in office visit

## 2018-09-26 ENCOUNTER — Encounter: Payer: Self-pay | Admitting: Physical Therapy

## 2018-09-26 NOTE — Telephone Encounter (Signed)
Unable to get in contact with the patient. I left her a voicemail asking her to return my call. Office number was provided.  If patient calls back please convert her office visit into a doxy.me with Jinny Blossom.

## 2018-09-28 ENCOUNTER — Telehealth: Payer: Medicare PPO | Admitting: Family

## 2018-09-28 ENCOUNTER — Encounter: Payer: Self-pay | Admitting: Physical Therapy

## 2018-09-28 ENCOUNTER — Ambulatory Visit: Payer: Medicare PPO | Admitting: Adult Health

## 2018-09-28 ENCOUNTER — Ambulatory Visit (INDEPENDENT_AMBULATORY_CARE_PROVIDER_SITE_OTHER): Payer: Medicare PPO | Admitting: Adult Health

## 2018-09-28 ENCOUNTER — Encounter: Payer: Self-pay | Admitting: Adult Health

## 2018-09-28 ENCOUNTER — Other Ambulatory Visit: Payer: Self-pay

## 2018-09-28 ENCOUNTER — Ambulatory Visit: Payer: Medicare PPO | Admitting: Physical Therapy

## 2018-09-28 DIAGNOSIS — G4733 Obstructive sleep apnea (adult) (pediatric): Secondary | ICD-10-CM

## 2018-09-28 DIAGNOSIS — M6281 Muscle weakness (generalized): Secondary | ICD-10-CM

## 2018-09-28 DIAGNOSIS — R2681 Unsteadiness on feet: Secondary | ICD-10-CM | POA: Diagnosis not present

## 2018-09-28 DIAGNOSIS — Z9989 Dependence on other enabling machines and devices: Secondary | ICD-10-CM | POA: Diagnosis not present

## 2018-09-28 DIAGNOSIS — R2689 Other abnormalities of gait and mobility: Secondary | ICD-10-CM

## 2018-09-28 DIAGNOSIS — R8290 Unspecified abnormal findings in urine: Secondary | ICD-10-CM

## 2018-09-28 DIAGNOSIS — M79661 Pain in right lower leg: Secondary | ICD-10-CM | POA: Diagnosis not present

## 2018-09-28 DIAGNOSIS — R293 Abnormal posture: Secondary | ICD-10-CM | POA: Diagnosis not present

## 2018-09-28 NOTE — Progress Notes (Signed)
  [facility] [address] [place] [address] [phone]     Virtual Visit via Telephone Note  I connected with Judith [name] on 09/28/18 at  3:00 PM EDT by telephone located remotely at [facility] and verified that I am speaking with the correct person using two identifiers who reports being located in the car- not driving.    I discussed the limitations, risks, security and privacy concerns of performing an evaluation and management service by telephone and the availability of in person appointments. I also discussed with the patient that there may be a patient responsible charge related to this service. The patient expressed understanding and agreed to proceed. See telephone note for consent and additional scheduling information.    History of Present Illness:  Judith [name] is a 68 y.o. female who has been followed in this office for OSA. She was initially scheduled for face-to-face office follow up visit today time but due to [place], visit rescheduled for non-face-to-face telephone visit with patients consent. Unable to participate in video visit due to lack of access to device with camera.     Judith [name] is a 68 year old female with a history of obstructive sleep apnea on BiPAP.  She returns today for a virtual visit.  The patient states that she is not been able to use the BiPAP machine.  She states that she has been in the hospital and she walked a significant amount of weight in her mouth.  She states that she was planning to call to get up to order a mask refitting but then COVID-19 happened so she waited.  However she does plan to restart the CPAP.  The patient also states that she would like to be seen in our office after hospitalization for hemorrhagic stroke.  I have not seen the hospital notes for this.  I have advised the patient that she should discuss with her primary care and have a referral sent over so we can review.   Observations/Objective:  Generalized: Well developed, in no acute distress   Neurological examination  Mentation: Alert oriented to time, place, history taking. Follows all commands speech and language fluent  Assessment and Plan:  1. OSA   The patient would like to restart the BiPAP.  I will send an order over to her DME company for a mask refitting.  Patient is advised that when she gets her new supplies she should try to use the CPAP nightly and greater than 4 hours each night.  We will see her back in 6 months for follow-up.  She is also advised to discuss with her primary care about the new referral to our office for hemorrhagic stroke.  Follow Up Instructions:    FU 6 months   I discussed the assessment and treatment plan with the patient.  The patient was provided an opportunity to ask questions and all were answered to their satisfaction. The patient agreed with the plan and verbalized an understanding of the instructions.   I provided 12 minutes of non-face-to-face time during this encounter.    Ward Givens, MSN, NP-C 09/28/2018, 3:00 PM [facility] [address], [place], [place] [address] [phone]

## 2018-09-28 NOTE — Progress Notes (Signed)
Greater than 5 minutes, yet less than 10 minutes of time have been spent researching, coordinating, and implementing care for this patient today.  Thank you for the details you included in the comment boxes. Those details are very helpful in determining the best course of treatment for you and help [place] to provide the best care.  Given that you do not have any typical symptoms of UTI and there is a simply a change in the urine appearance, please continue to monitor yourself for further symptoms for the next 24 hours. If you have any burning with urination, urinary urgency or frequency, please let us know. Also, given that you have a history of stage 3 kidney disease, please watch for a major decrease in urine production or a lack of urination.  If you have any common UTI symptoms (frequency, urgency, burning), within the next 24 hours, please reply to this message.  We are sorry that you are not feeling well.  Here is how we plan to help!  Based on what you shared with me it looks like you most likely have a simple urinary tract infection.  A UTI (Urinary Tract Infection) is a bacterial infection of the bladder.  Most cases of urinary tract infections are simple to treat but a key part of your care is to encourage you to drink plenty of fluids and watch your symptoms carefully.  I have prescribed (pending further symptoms).  Your symptoms should gradually improve. Call us if the burning in your urine worsens, you develop worsening fever, back pain or pelvic pain or if your symptoms do not resolve after completing the antibiotic.  Urinary tract infections can be prevented by drinking plenty of water to keep your body hydrated.  Also be sure when you wipe, wipe from front to back and don't hold it in!  If possible, empty your bladder every 4 hours.  Your e-visit answers were reviewed by a board certified advanced clinical practitioner to complete your personal care plan.  Depending on the condition,  your plan could have included both over the counter or prescription medications.  If there is a problem please reply  once you have received a response from your provider.  Your safety is important to [place].  If you have drug allergies check your prescription carefully.    You can use MyChart to ask questions about today's visit, request a non-urgent call back, or ask for a work or school excuse for 24 hours related to this e-Visit. If it has been greater than 24 hours you will need to follow up with your provider, or enter a new e-Visit to address those concerns.   You will get an e-mail in the next two days asking about your experience.  I hope that your e-visit has been valuable and will speed your recovery. Thank you for using e-visits.

## 2018-09-28 NOTE — Therapy (Signed)
[facility] [address] [place], [place], [address] Phone: [phone]   Fax:  [phone]  Physical Therapy Treatment  Patient Details  Name: Judith [name] MRN: [id number] Date of Birth: [birth year] Referring Provider (PT): [name], MD   Encounter Date: 09/28/2018  PT End of Session - 09/28/18 1258    Visit Number  7    Number of Visits  25    Date for PT Re-Evaluation  11/15/18    Authorization Type  [employer] Medicare & Medicaid    PT Start Time  1018    PT Stop Time  1100    PT Time Calculation (min)  42 min    Equipment Utilized During Treatment  Gait belt    Activity Tolerance  Patient tolerated treatment well;No increased pain    Behavior During Therapy  [facility] for tasks assessed/performed       CLINIC OPERATION CHANGES: [facility] is operating at a low capacity due to COVID-19.  The patient was brought into the clinic for evaluation and/or treatment following universal masking by staff, social distancing, and <10 people in the clinic.  The patient's COVID risk of complications score is 6.   Past Medical History:  Diagnosis Date  . Abnormal EKG 07/31/2013  . Arthritis    "hands" (03/06/2015)  . Asthma   . Carpal tunnel syndrome, bilateral   . Chronic kidney disease   . Complication of anesthesia    slow to wake up. Pt sts she woke up during surgery many years ago.  . Coronary artery disease     2 v CAD with CTO of the RCA and high grade bifurcational LCx/OM stenosis. S/P PCI DES x 2 to the LCx/OM.  Marland Kitchen Diabetic peripheral neuropathy ([place]) "since 1996"  . GERD (gastroesophageal reflux disease)   . Goiter   . Headache    migraines prior to menopause  . History of shingles 06/01/2013  . Hyperlipidemia LDL goal <70 10/13/2015  . Hypertension   . Hyperthyroidism   . Osteomyelitis of foot (HCC)    Right  . PAF (paroxysmal atrial fibrillation) ([place]) 04/29/2015   CHADS2VASC score  of 5 now on Apixaban  . Pneumonia ~ 1976  . Sleep apnea    Bipap  . Tremors of nervous system   . Type II diabetes mellitus (HCC)    insulin dependent    Past Surgical History:  Procedure Laterality Date  . ABDOMINAL HYSTERECTOMY  1988   age 68; CERVICAL DYSPLASIA; ovaries intact.   . AMPUTATION Right 01/23/2016   Procedure: Right 3rd Ray Amputation;  Surgeon: [name], MD;  Location: [facility];  Service: Orthopedics;  Laterality: Right;  . AMPUTATION Right 02/13/2016   Procedure: Right Transmetatarsal Amputation;  Surgeon: [name], MD;  Location: [facility];  Service: Orthopedics;  Laterality: Right;  . AMPUTATION Right 04/15/2018   Procedure: AMPUTATION BELOW KNEE;  Surgeon: [name], MD;  Location: [facility];  Service: Orthopedics;  Laterality: Right;  . BIOPSY  04/16/2018   Procedure: BIOPSY;  Surgeon: Irene Shipper, MD;  Location: [facility] ENDOSCOPY;  Service: Endoscopy;;  . CARDIAC CATHETERIZATION N/A 02/27/2015   Procedure: Left Heart Cath and Coronary Angiography;  Surgeon: [name], MD; LAD 40%, mCFX 80%, OM 70%, RCA 100% calcified       . CARDIAC CATHETERIZATION N/A 03/06/2015   Procedure: Coronary Stent Intervention;  Surgeon: [name], MD;  Location: [facility] CV LAB;  Service: Cardiovascular;  Laterality:  N/A;  Mid CX 3.50x12 promus DES w/ 0% resdual and Prox OM1 2.50x20 promus DES w/ 20% residual  . CARDIOVERSION    . CARDIOVERSION N/A 05/22/2018   Procedure: CARDIOVERSION;  Surgeon: Skeet Latch, MD;  Location: [facility];  Service: Cardiovascular;  Laterality: N/A;  . CARPAL TUNNEL RELEASE Right Nov 2015  . CARPAL TUNNEL RELEASE Right 1992; 05/2014   [name]; [place]  . CESAREAN SECTION  1982; 1984  . ESOPHAGOGASTRODUODENOSCOPY (EGD) WITH PROPOFOL N/A 04/16/2018   Procedure: ESOPHAGOGASTRODUODENOSCOPY (EGD) WITH PROPOFOL;  Surgeon: Irene Shipper, MD;  [facility] ENDOSCOPY;  Service: Endoscopy;  Laterality: N/A;  . FOOT NEUROMA SURGERY Bilateral 2000   . I&D EXTREMITY Left 01/06/2018   Procedure: DEBRIDEMENT ULCER LEFT FOOT;  Surgeon: [name], MD;  Location: [facility];  Service: Orthopedics;  Laterality: Left;  . KNEE ARTHROSCOPY Right ~ 2003   "meniscus repair"  . SHOULDER OPEN ROTATOR CUFF REPAIR Right 1996; 1998   "w/fracture repair"  . THYROID SURGERY  2000   "removed lots of nodules"  . TONSILLECTOMY  1976    There were no vitals filed for this visit.  Subjective Assessment - 09/28/18 1018    Subjective  She got new shrinker 2 weeks ago & still having itchiness with use. She scratched herself in her sleep.     Pertinent History  R TTA, asthma, CAD, IDDM2, neuropathy, shingles, HTN, Left non-traumatic intracerebral hemorrhage, CKD stage 3, right shoulder impingement syndrome    Limitations  Lifting;Standing;Walking;House hold activities    Patient Stated Goals  To use prosthesis to walk in community & home, gardening, [place]    Currently in Pain?  No/denies       Prosthetic Training with Transtibial Prosthesis: Pt has scratches on proximal knee. No signs of infection.  Pt wrote notes with plans to go home & type them up (she reports this helps her learning / retention of information) skin care: use hydrocortisone cream on limb where itches at night & morning, have warm wash cloth present when doffes prosthesis & wipe limb immediately to remove sweat & minimize need to scratch with her nails, wear a knee high under shrinker. PT instructed in adjusting ply socks: ambulating with too few, too many & correct socks along with written guidelines.  PT instructed to use Hulan Fray (she has already) or Secret [employer] Strength solid on limb to manage sweat.   Gait training with cane with cues on proper step width.                        PT Education - 09/28/18 1055    Education Details  Pt took notes with PT verbal instructions:  skin care, shrinker wear, sweat management, adjusting ply socks     Person(s) Educated  Patient    Methods  Explanation;Demonstration;Tactile cues;Verbal cues    Comprehension  Verbalized understanding;Verbal cues required;Need further instruction       PT Short Term Goals - 09/28/18 1525      PT SHORT TERM GOAL #1   Title  Patient demonstrates proper adjustment of ply socks with limb volume changes. (All STGs Target Date 10/20/2018)    Time  4    Period  Weeks    Status  Revised    Target Date  10/20/18      PT SHORT TERM GOAL #2   Title  Patient tolerates prosthesis wear >12hrs total /day without skin issues.  Time  4    Period  Weeks    Status  Revised    Target Date  10/20/18      PT SHORT TERM GOAL #3   Title  Berg Balance >/= 40/56    Time  4    Period  Weeks    Status  New    Target Date  10/20/18      PT SHORT TERM GOAL #4   Title  Patient ambulates 300' with cane or less & prosthesis with supervision.     Time  4    Period  Weeks    Status  Revised    Target Date  10/20/18      PT SHORT TERM GOAL #5   Title  Patient negotiates stairs with 2 rails, ramp & curb with cane or less & prosthesis with supervision.     Time  4    Period  Weeks    Status  Revised    Target Date  10/20/18        PT Long Term Goals - 09/14/18 2135      PT LONG TERM GOAL #1   Title  Patient verbalizes & demonstrates understanding of prosthetic care to enable safe use of prosthesis (All LTGs Target Date 11/15/2018)    Time  12    Period  Weeks    Status  On-going    Target Date  11/15/18      PT LONG TERM GOAL #2   Title  Patient tolerates prosthesis wear >90% of awake hours without skin or pain issues to enable function throughout her day.     Time  12    Period  Weeks    Status  On-going    Target Date  11/15/18      PT LONG TERM GOAL #3   Title  Berg Balance >/= 45/56 to indicate lower fall risk.     Time  12    Period  Weeks    Status  On-going    Target Date  11/15/18      PT LONG TERM GOAL #4   Title  Patient ambulates >500'  outdoors with LRAD modified independent to enable safe function in community.     Time  12    Period  Weeks    Status  On-going    Target Date  11/15/18      PT LONG TERM GOAL #5   Title  Patient negotiates stairs, ramps & curbs with LRAD & Prosthesis modified independent to enable community access.     Time  12    Period  Weeks    Status  On-going    Target Date  11/15/18      PT LONG TERM GOAL #6   Title  Patient ambulates 30' around furniture with cane or less & prosthesis carrying household items modified independent for household function.     Time  12    Period  Weeks    Status  On-going    Target Date  11/15/18            Plan - 09/28/18 1533    Clinical Impression Statement  Today's skilled session focused on educating patient on prosthetic issues including skin care, shrinker wear, adjusting ply socks and managing sweat. Patient seems to have better understanding.     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 prosthetic gait with cane for community /outdoors & no device indoors    Consulted and Agree with Plan of Care  Patient       Patient will benefit from skilled therapeutic intervention in order to improve the following deficits and impairments:  Abnormal gait, Decreased activity tolerance, Decreased balance, Decreased endurance, Decreased knowledge of use of DME, Decreased mobility, Decreased range of motion, Decreased strength, Dizziness, Increased edema, Postural dysfunction, Prosthetic Dependency, Pain  Visit Diagnosis: Abnormal posture  Muscle weakness (generalized)  Unsteadiness on feet  Other abnormalities of gait and mobility     Problem List Patient Active Problem List   Diagnosis Date Noted  . Diarrhea 09/05/2018  . Urinary tract infection 06/30/2018  . ICH (intracerebral hemorrhage) ([place]) 06/27/2018  . DKA (diabetic ketoacidoses) ([place]) 06/26/2018  . V-tach ([place]) 05/14/2018  . Seizure ([place]) 05/14/2018  . Slow transit constipation   . Fall   . Labile blood glucose   . Labile blood pressure   . Chronic combined systolic and diastolic congestive heart failure ([place])   . Chronic diastolic congestive heart failure ([place])   . CKD (chronic kidney disease), stage III ([place])   . Type 2 diabetes mellitus with peripheral neuropathy (HCC)   . Amputation of right lower extremity below knee ([place]) 04/20/2018  . Unilateral traumatic amputation of right leg below knee with complication, initial encounter ([place])   . Post-operative pain   . PAF (paroxysmal atrial fibrillation) ([place])   . Duodenal ulcer   . Atrial fibrillation with RVR ([place]) 04/14/2018  . Foot osteomyelitis, right ([place]) 04/14/2018  . Cutaneous abscess of left foot   . Acute respiratory failure with hypoxia ([place])   . Cellulitis 01/02/2018  . LGI bleed   . Acute blood loss anemia   . Wide-complex tachycardia ([place]) 07/04/2017  . Type II diabetes mellitus, uncontrolled ([place]) 07/04/2017  . Peripheral  neuropathy 07/04/2017  . H/O hyperthyroidism 07/04/2017  . Atrial fibrillation with rapid ventricular response ([place]) 08/06/2016  . S/P transmetatarsal amputation of foot, right ([place]) 04/19/2016  . Obstructive sleep apnea 11/26/2015  . Bilateral carpal tunnel syndrome 11/26/2015  . Hypomagnesemia 11/16/2015  . Hyperlipidemia LDL goal <70 10/13/2015  . Abnormality of gait 09/02/2015  . Memory loss 08/12/2015  . Diabetic peripheral neuropathy ([place]) 08/12/2015  . Vitamin D deficiency 08/12/2015  . Hyperthyroidism 04/30/2015  . Heme positive stool   . Persistent atrial fibrillation 04/29/2015  . Coronary artery disease with stable angina pectoris ([place]) 03/29/2015  . Abnormal nuclear stress test   . History of goiter 09/28/2014  . GERD (gastroesophageal reflux disease) 07/31/2013  . Depression with anxiety 06/01/2013  . DM (diabetes mellitus), type 2 with peripheral vascular complications ([place]) 25/95/6387  . HTN (hypertension) 01/30/2013    Jamey Reas PT, DPT 09/28/2018, 3:34 PM  [facility] [address] [place], [place], [address] Phone: [phone]   Fax:  [facility]  Name: MACENZIE BURFORD MRN: [id number] Date of Birth: Jul 07, 1950

## 2018-09-28 NOTE — Telephone Encounter (Signed)
Pt understands that although there may be some limitations with this type of visit, we will take all precautions to reduce any security or privacy concerns.  Pt understands that this will be treated like an in office visit and we will file with pt's insurance, and there may be a patient responsible charge related to this service.  E-mail: [email or web address]   *email sent*

## 2018-09-29 ENCOUNTER — Encounter: Payer: Self-pay | Admitting: Physician Assistant

## 2018-09-29 ENCOUNTER — Ambulatory Visit (INDEPENDENT_AMBULATORY_CARE_PROVIDER_SITE_OTHER): Payer: Medicare PPO | Admitting: Physician Assistant

## 2018-09-29 ENCOUNTER — Other Ambulatory Visit: Payer: Self-pay

## 2018-09-29 VITALS — Ht 64.0 in | Wt 150.0 lb

## 2018-09-29 DIAGNOSIS — M7541 Impingement syndrome of right shoulder: Secondary | ICD-10-CM

## 2018-09-29 DIAGNOSIS — N183 Chronic kidney disease, stage 3 unspecified: Secondary | ICD-10-CM

## 2018-09-29 DIAGNOSIS — Z794 Long term (current) use of insulin: Secondary | ICD-10-CM | POA: Diagnosis not present

## 2018-09-29 DIAGNOSIS — Z89511 Acquired absence of right leg below knee: Secondary | ICD-10-CM | POA: Diagnosis not present

## 2018-09-29 DIAGNOSIS — R2241 Localized swelling, mass and lump, right lower limb: Secondary | ICD-10-CM

## 2018-09-29 DIAGNOSIS — E1142 Type 2 diabetes mellitus with diabetic polyneuropathy: Secondary | ICD-10-CM | POA: Diagnosis not present

## 2018-09-29 NOTE — Progress Notes (Signed)
Office Visit Note   Patient: Judith [name]           Date of Birth: 04/18/1951           MRN: [id number] Visit Date: 09/29/2018              Requested by: Rutherford Guys, MD [address] [place], [place] [address] PCP: Rutherford Guys, MD  Chief Complaint  Patient presents with   Right Shoulder - Follow-up      HPI: The patient is a 68 yo woman who is seen for her right shoulder. She has had chronic impingement symptoms and was scheduling for arthroscopy and debridement of the right shoulder but surgery was put on hold due to Covid 19. She would like to proceed with shoulder arthroscopy at this time.  She has also developed a mass over the right lateral thigh which has become tender to palpation and she would like this excised as well.   She is otherwise doing very well following a right transtibial amputation and is ambulating with a cane and right prosthesis.  Assessment & Plan: Visit Diagnoses:  1. Impingement syndrome of right shoulder   2. Mass of right thigh   3. Acquired absence of right leg below knee (HCC)   4. Type 2 diabetes mellitus with diabetic polyneuropathy, with long-term current use of insulin ([place])   5. CKD (chronic kidney disease), stage III ([place])     Plan: Plan right shoulder arthroscopy and debridement and right thigh excision of mass.  She will follow up following surgery.   Follow-Up Instructions: Return in about 4 weeks (around 10/27/2018).   Ortho Exam  Patient is alert, oriented, no adenopathy, well-dressed, normal affect, normal respiratory effort. Right shoulder tender over the deltoid distal and biceps insertion. Painful and limited forward flexion, internal and external rotation. + impingement. Negative drop arm. Neurovascular intact in the right upper extremity.   The right transtibial amputation is well healed and consolidated. No signs of breakdown or infection. Ambulates with cane with right prosthesis.   Right lateral thigh with ~ 2cm  firm, slightly mobile tender mass within the subcutaneous tissue without increased warmth, no erythema, no induration or other signs of infection or cellulitis.  Imaging: No results found. No images are attached to the encounter.  Labs: Lab Results  Component Value Date   HGBA1C 7.5 (A) 06/07/2018   HGBA1C 8.0 (H) 04/14/2018   HGBA1C 7.9 (A) 04/03/2018   ESRSEDRATE 133 (H) 01/02/2018   CRP 23.5 (H) 01/02/2018   REPTSTATUS 05/20/2018 FINAL 05/18/2018   GRAMSTAIN  01/06/2018    MODERATE WBC PRESENT, PREDOMINANTLY PMN RARE GRAM POSITIVE COCCI    CULT 20,000 COLONIES/mL SERRATIA MARCESCENS (A) 05/18/2018   LABORGA SERRATIA MARCESCENS (A) 05/18/2018     Lab Results  Component Value Date   ALBUMIN 4.0 07/07/2018   ALBUMIN 3.6 06/08/2018   ALBUMIN 4.2 05/25/2018    Body mass index is 25.75 kg/m.  Orders:  No orders of the defined types were placed in this encounter.  No orders of the defined types were placed in this encounter.    Procedures: No procedures performed  Clinical Data: No additional findings.  ROS:  All other systems negative, except as noted in the HPI. Review of Systems  Objective: Vital Signs: Ht 5\' 4"  (1.626 m)    Wt 150 lb (68 kg)    BMI 25.75 kg/m   Specialty Comments:  No specialty comments available.  PMFS History: Patient Active  Problem List   Diagnosis Date Noted   Diarrhea 09/05/2018   Urinary tract infection 06/30/2018   ICH (intracerebral hemorrhage) ([place]) 06/27/2018   DKA (diabetic ketoacidoses) ([place]) 06/26/2018   V-tach ([place]) 05/14/2018   Seizure ([place]) 05/14/2018   Slow transit constipation    Fall    Labile blood glucose    Labile blood pressure    Chronic combined systolic and diastolic congestive heart failure (HCC)    Chronic diastolic congestive heart failure (HCC)    CKD (chronic kidney disease), stage III (HCC)    Type 2 diabetes mellitus with peripheral neuropathy ([place])    Amputation of right lower  extremity below knee ([place]) 04/20/2018   Unilateral traumatic amputation of right leg below knee with complication, initial encounter (HCC)    Post-operative pain    PAF (paroxysmal atrial fibrillation) ([place])    Duodenal ulcer    Atrial fibrillation with RVR ([place]) 04/14/2018   Foot osteomyelitis, right ([place]) 04/14/2018   Cutaneous abscess of left foot    Acute respiratory failure with hypoxia (HCC)    Cellulitis 01/02/2018   LGI bleed    Acute blood loss anemia    Wide-complex tachycardia (HCC) 07/04/2017   Type II diabetes mellitus, uncontrolled ([place]) 07/04/2017   Peripheral neuropathy 07/04/2017   H/O hyperthyroidism 07/04/2017   Atrial fibrillation with rapid ventricular response ([place]) 08/06/2016   S/P transmetatarsal amputation of foot, right ([place]) 04/19/2016   Obstructive sleep apnea 11/26/2015   Bilateral carpal tunnel syndrome 11/26/2015   Hypomagnesemia 11/16/2015   Hyperlipidemia LDL goal <70 10/13/2015   Abnormality of gait 09/02/2015   Memory loss 08/12/2015   Diabetic peripheral neuropathy ([place]) 08/12/2015   Vitamin D deficiency 08/12/2015   Hyperthyroidism 04/30/2015   Heme positive stool    Persistent atrial fibrillation 04/29/2015   Coronary artery disease with stable angina pectoris ([place]) 03/29/2015   Abnormal nuclear stress test    History of goiter 09/28/2014   GERD (gastroesophageal reflux disease) 07/31/2013   Depression with anxiety 06/01/2013   DM (diabetes mellitus), type 2 with peripheral vascular complications ([place]) 38/02/1750   HTN (hypertension) 01/30/2013   Past Medical History:  Diagnosis Date   Abnormal EKG 07/31/2013   Arthritis    "hands" (03/06/2015)   Asthma    Carpal tunnel syndrome, bilateral    Chronic kidney disease    Complication of anesthesia    slow to wake up. Pt sts she woke up during surgery many years ago.   Coronary artery disease     2 v CAD with CTO of the RCA and high grade  bifurcational LCx/OM stenosis. S/P PCI DES x 2 to the LCx/OM.   Diabetic peripheral neuropathy ([place]) "since 1996"   GERD (gastroesophageal reflux disease)    Goiter    Headache    migraines prior to menopause   History of shingles 06/01/2013   Hyperlipidemia LDL goal <70 10/13/2015   Hypertension    Hyperthyroidism    Osteomyelitis of foot (HCC)    Right   PAF (paroxysmal atrial fibrillation) ([place]) 04/29/2015   CHADS2VASC score of 5 now on Apixaban   Pneumonia ~ 1976   Sleep apnea    Bipap   Tremors of nervous system    Type II diabetes mellitus (HCC)    insulin dependent    Family History  Problem Relation Age of Onset   Cancer Mother 66       bronchial cancer   Breast cancer Mother    Lung cancer Mother  Hypertension Father    COPD Father    Heart disease Father 44       CAD with cardiac stenting   Heart attack Father    Parkinson's disease Father    Allergies Sister    Breast cancer Maternal Grandmother    Emphysema Maternal Grandfather    Leukemia Paternal Grandmother    Emphysema Paternal Grandfather    Thyroid disease Neg Hx     Past Surgical History:  Procedure Laterality Date   ABDOMINAL HYSTERECTOMY  1988   age 59; CERVICAL DYSPLASIA; ovaries intact.    AMPUTATION Right 01/23/2016   Procedure: Right 3rd Ray Amputation;  Surgeon: [name], MD;  Location: [facility];  Service: Orthopedics;  Laterality: Right;   AMPUTATION Right 02/13/2016   Procedure: Right Transmetatarsal Amputation;  Surgeon: [name], MD;  Location: [facility];  Service: Orthopedics;  Laterality: Right;   AMPUTATION Right 04/15/2018   Procedure: AMPUTATION BELOW KNEE;  Surgeon: [name], MD;  Location: [facility];  Service: Orthopedics;  Laterality: Right;   BIOPSY  04/16/2018   Procedure: BIOPSY;  Surgeon: Irene Shipper, MD;  Location: [facility] ENDOSCOPY;  Service: Endoscopy;;   CARDIAC CATHETERIZATION N/A 02/27/2015   Procedure: Left Heart Cath and Coronary  Angiography;  Surgeon: [name], MD; LAD 40%, mCFX 80%, OM 70%, RCA 100% calcified        CARDIAC CATHETERIZATION N/A 03/06/2015   Procedure: Coronary Stent Intervention;  Surgeon: [name], MD;  Location: [facility] CV LAB;  Service: Cardiovascular;  Laterality: N/A;  Mid CX 3.50x12 promus DES w/ 0% resdual and Prox OM1 2.50x20 promus DES w/ 20% residual   CARDIOVERSION     CARDIOVERSION N/A 05/22/2018   Procedure: CARDIOVERSION;  Surgeon: Skeet Latch, MD;  Location: [facility];  Service: Cardiovascular;  Laterality: N/A;   CARPAL TUNNEL RELEASE Right Nov 2015   CARPAL TUNNEL RELEASE Right 1992; 05/2014   [name]; [place]; 1984   ESOPHAGOGASTRODUODENOSCOPY (EGD) WITH PROPOFOL N/A 04/16/2018   Procedure: ESOPHAGOGASTRODUODENOSCOPY (EGD) WITH PROPOFOL;  Surgeon: Irene Shipper, MD;  Location: [facility] ENDOSCOPY;  Service: Endoscopy;  Laterality: N/A;   FOOT NEUROMA SURGERY Bilateral 2000   I&D EXTREMITY Left 01/06/2018   Procedure: DEBRIDEMENT ULCER LEFT FOOT;  Surgeon: [name], MD;  Location: [facility];  Service: Orthopedics;  Laterality: Left;   KNEE ARTHROSCOPY Right ~ 2003   "meniscus repair"   SHOULDER OPEN ROTATOR CUFF REPAIR Right 1996; 1998   "w/fracture repair"   THYROID SURGERY  2000   "removed lots of nodules"   TONSILLECTOMY  1976   Social History   Occupational History   Occupation: Landscape architect  Tobacco Use   Smoking status: Former Smoker    Packs/day: 0.00    Years: 41.00    Pack years: 0.00    Types: Cigarettes    Last attempt to quit: 03/05/2015    Years since quitting: 3.5   Smokeless tobacco: Never Used   Tobacco comment: 04/29/2015 "quit smoking cigarettes 02/27/2015"  Substance and Sexual Activity   Alcohol use: No   Drug use: No   Sexual activity: Not Currently    Birth control/protection: Post-menopausal, Surgical

## 2018-10-02 ENCOUNTER — Other Ambulatory Visit: Payer: Self-pay | Admitting: Cardiology

## 2018-10-02 DIAGNOSIS — I2510 Atherosclerotic heart disease of native coronary artery without angina pectoris: Secondary | ICD-10-CM

## 2018-10-03 ENCOUNTER — Encounter: Payer: Self-pay | Admitting: Physical Therapy

## 2018-10-03 ENCOUNTER — Ambulatory Visit (INDEPENDENT_AMBULATORY_CARE_PROVIDER_SITE_OTHER): Payer: Medicare PPO | Admitting: Podiatry

## 2018-10-03 ENCOUNTER — Encounter: Payer: Self-pay | Admitting: Podiatry

## 2018-10-03 ENCOUNTER — Encounter: Payer: Self-pay | Admitting: Adult Health

## 2018-10-03 ENCOUNTER — Encounter: Payer: Self-pay | Admitting: Family Medicine

## 2018-10-03 VITALS — Temp 98.1°F

## 2018-10-03 DIAGNOSIS — M79675 Pain in left toe(s): Secondary | ICD-10-CM

## 2018-10-03 DIAGNOSIS — B351 Tinea unguium: Secondary | ICD-10-CM | POA: Diagnosis not present

## 2018-10-03 DIAGNOSIS — E1149 Type 2 diabetes mellitus with other diabetic neurological complication: Secondary | ICD-10-CM | POA: Diagnosis not present

## 2018-10-03 DIAGNOSIS — M79674 Pain in right toe(s): Secondary | ICD-10-CM | POA: Diagnosis not present

## 2018-10-03 NOTE — Progress Notes (Signed)
CMM sent to [facility] for new cpap order. sy

## 2018-10-03 NOTE — Progress Notes (Signed)
[facility] received per Janett Billow.

## 2018-10-04 ENCOUNTER — Ambulatory Visit: Payer: Medicare PPO | Admitting: Physical Therapy

## 2018-10-04 ENCOUNTER — Encounter: Payer: Self-pay | Admitting: Physical Therapy

## 2018-10-04 ENCOUNTER — Other Ambulatory Visit: Payer: Self-pay

## 2018-10-04 DIAGNOSIS — M79661 Pain in right lower leg: Secondary | ICD-10-CM | POA: Diagnosis not present

## 2018-10-04 DIAGNOSIS — M6281 Muscle weakness (generalized): Secondary | ICD-10-CM | POA: Diagnosis not present

## 2018-10-04 DIAGNOSIS — R2689 Other abnormalities of gait and mobility: Secondary | ICD-10-CM

## 2018-10-04 DIAGNOSIS — R2681 Unsteadiness on feet: Secondary | ICD-10-CM

## 2018-10-04 DIAGNOSIS — R293 Abnormal posture: Secondary | ICD-10-CM | POA: Diagnosis not present

## 2018-10-04 NOTE — Therapy (Signed)
[facility] [address] [name], [place], [address] Phone: [phone]   Fax:  [phone]  Physical Therapy Treatment  Patient Details  Name: Judith [name] MRN: [id number] Date of Birth: September 02, 1950 Referring Provider (PT): [name], MD   Encounter Date: 10/04/2018   CLINIC OPERATION CHANGES: [facility] is operating at a low capacity due to COVID-19.  The patient was brought into the clinic for evaluation and/or treatment following universal masking by staff, social distancing, and <10 people in the clinic.  The patient's COVID risk of complications score is 6.  PT End of Session - 10/04/18 1330    Visit Number  8    Number of Visits  25    Date for PT Re-Evaluation  11/15/18    Authorization Type  [employer] Medicaid- 10th visit progress note    PT Start Time  1317    PT Stop Time  1400    PT Time Calculation (min)  43 min    Equipment Utilized During Treatment  Gait belt    Activity Tolerance  Patient tolerated treatment well;No increased pain    Behavior During Therapy  WFL for tasks assessed/performed       Past Medical History:  Diagnosis Date  . Abnormal EKG 07/31/2013  . Arthritis    "hands" (03/06/2015)  . Asthma   . Carpal tunnel syndrome, bilateral   . Chronic kidney disease   . Complication of anesthesia    slow to wake up. Pt sts she woke up during surgery many years ago.  . Coronary artery disease     2 v CAD with CTO of the RCA and high grade bifurcational LCx/OM stenosis. S/P PCI DES x 2 to the LCx/OM.  Marland Kitchen Diabetic peripheral neuropathy ([place]) "since 1996"  . GERD (gastroesophageal reflux disease)   . Goiter   . Headache    migraines prior to menopause  . History of shingles 06/01/2013  . Hyperlipidemia LDL goal <70 10/13/2015  . Hypertension   . Hyperthyroidism   . Osteomyelitis of foot (HCC)    Right  . PAF (paroxysmal atrial fibrillation) ([place])  04/29/2015   CHADS2VASC score of 5 now on Apixaban  . Pneumonia ~ 1976  . Sleep apnea    Bipap  . Tremors of nervous system   . Type II diabetes mellitus (HCC)    insulin dependent    Past Surgical History:  Procedure Laterality Date  . ABDOMINAL HYSTERECTOMY  1988   age 57; CERVICAL DYSPLASIA; ovaries intact.   . AMPUTATION Right 01/23/2016   Procedure: Right 3rd Ray Amputation;  Surgeon: [name], MD;  Location: [facility];  Service: Orthopedics;  Laterality: Right;  . AMPUTATION Right 02/13/2016   Procedure: Right Transmetatarsal Amputation;  Surgeon: [name], MD;  Location: [facility];  Service: Orthopedics;  Laterality: Right;  . AMPUTATION Right 04/15/2018   Procedure: AMPUTATION BELOW KNEE;  Surgeon: [name], MD;  Location: [facility];  Service: Orthopedics;  Laterality: Right;  . BIOPSY  04/16/2018   Procedure: BIOPSY;  Surgeon: Irene Shipper, MD;  Location: [facility] ENDOSCOPY;  Service: Endoscopy;;  . CARDIAC CATHETERIZATION N/A 02/27/2015   Procedure: Left Heart Cath and Coronary Angiography;  Surgeon: [name], MD; LAD 40%, mCFX 80%, OM 70%, RCA 100% calcified       . CARDIAC CATHETERIZATION N/A 03/06/2015   Procedure: Coronary Stent Intervention;  Surgeon: [name], MD;  Location: [facility];  Service: Cardiovascular;  Laterality: N/A;  Mid CX 3.50x12 promus DES w/ 0% resdual and Prox OM1 2.50x20 promus DES w/ 20% residual  . CARDIOVERSION    . CARDIOVERSION N/A 05/22/2018   Procedure: CARDIOVERSION;  Surgeon: Skeet Latch, MD;  Location: [facility];  Service: Cardiovascular;  Laterality: N/A;  . CARPAL TUNNEL RELEASE Right Nov 2015  . CARPAL TUNNEL RELEASE Right 1992; 05/2014   [name]; [place]  . CESAREAN SECTION  1982; 1984  . ESOPHAGOGASTRODUODENOSCOPY (EGD) WITH PROPOFOL N/A 04/16/2018   Procedure: ESOPHAGOGASTRODUODENOSCOPY (EGD) WITH PROPOFOL;  Surgeon: Irene Shipper, MD;  Location: [facility] ENDOSCOPY;  Service: Endoscopy;  Laterality: N/A;  . FOOT  NEUROMA SURGERY Bilateral 2000  . I&D EXTREMITY Left 01/06/2018   Procedure: DEBRIDEMENT ULCER LEFT FOOT;  Surgeon: [name], MD;  Location: [facility];  Service: Orthopedics;  Laterality: Left;  . KNEE ARTHROSCOPY Right ~ 2003   "meniscus repair"  . SHOULDER OPEN ROTATOR CUFF REPAIR Right 1996; 1998   "w/fracture repair"  . THYROID SURGERY  2000   "removed lots of nodules"  . TONSILLECTOMY  1976    There were no vitals filed for this visit.  Subjective Assessment - 10/04/18 1320    Subjective  Reports she was tested for Covid 19 and it was negative (was tested due to being high risk). Had a fall after seeing PT last week, that same day within an hour of leaving. Was using the cane. Landed on left side. Was sore on her left side (shoulder, elbow and leg). Still having pain on left knee. Has seen Dr. Jess Barters PA since then for a follow up on the right limb and she checked out the left knee as well. Was not concerned. The PA also looked at the mass on her right limb and pt states there is a plan to excise it when she has her right shoulder surgery. Since the fall she has found two other places with tissue masses- left lateral thigh and the other one is right below the 1st one. She has not called Dr. Jess Barters office to let them know. Advised her to do so- she will today.     Pertinent History  R TTA, asthma, CAD, IDDM2, neuropathy, shingles, HTN, Left non-traumatic intracerebral hemorrhage, CKD stage 3, right shoulder impingement syndrome    Limitations  Lifting;Standing;Walking;House hold activities    Patient Stated Goals  To use prosthesis to walk in community & home, gardening, [place]    Currently in Pain?  Yes    Pain Score  5     Pain Location  Knee    Pain Orientation  Left    Pain Descriptors / Indicators  Tender;Aching    Pain Type  Acute pain    Pain Onset  In the past 7 days    Pain Frequency  Constant    Aggravating Factors   laying down, touch/pressure    Pain  Relieving Factors  ice/heat so far, resting, standing up makes it hurt less.           [facility] Adult PT Treatment/Exercise - 10/04/18 1331      Transfers   Transfers  Sit to Stand;Stand to Sit    Sit to Stand  5: Supervision;With upper extremity assist;From chair/3-in-1;With armrests    Stand to Sit  5: Supervision;With upper extremity assist;To chair/3-in-1;With armrests      Ambulation/Gait   Ambulation/Gait  Yes    Ambulation/Gait Assistance  4: Min guard;5: Supervision  Ambulation Distance (Feet)  120 Feet   x1, plus around gym with activity   Assistive device  Straight cane;Prosthesis    Gait Pattern  Step-through pattern;Decreased step length - left;Decreased stance time - right;Narrow base of support    Ambulation Surface  Level;Indoor      High Level Balance   High Level Balance Activities  Side stepping    High Level Balance Comments  in parallel bars with no UE support: 3 laps toward each side with cues on form and technique.       Therapeutic Activites    Therapeutic Activities  Other Therapeutic Activities    Other Therapeutic Activities  pt with question on what device she should use with going to pull weeds out of her garden, cane or RW. educated pt on use of garden kneel bench and use of device that allows her to be steady on outdoor surfaces, currently using cane. provided pt with ordering information from [place] for garden kneel bench.       Prosthetics   Prosthetic Care Comments   reviewed notes from last session with them being correct. Pt with questions on best way to set up her bedroom and bathroom for emergency use at night due to frequency/urgency at times. her w/c does not fit in doorway, her transport chair does however she can't turn it around once in there. she is to bring in pictures of what her sister has done to assist her as this PTA could not follow what she was saying.     Current prosthetic wear tolerance (days/week)   daily    Current prosthetic  wear tolerance (#hours/day)   most of awake hours    Residual limb condition   scratches are healing, no new ones noted. Pt does have a bruise on back of thigh on residual limb. states this is from her [facility] at home.     Donning Prosthesis  Supervision    Doffing Prosthesis  Modified independent (device/increased time)          PT Short Term Goals - 09/28/18 1525      PT SHORT TERM GOAL #1   Title  Patient demonstrates proper adjustment of ply socks with limb volume changes. (All STGs Target Date 10/20/2018)    Time  4    Period  Weeks    Status  Revised    Target Date  10/20/18      PT SHORT TERM GOAL #2   Title  Patient tolerates prosthesis wear >12hrs total /day without skin issues.     Time  4    Period  Weeks    Status  Revised    Target Date  10/20/18      PT SHORT TERM GOAL #3   Title  Berg Balance >/= 40/56    Time  4    Period  Weeks    Status  New    Target Date  10/20/18      PT SHORT TERM GOAL #4   Title  Patient ambulates 300' with cane or less & prosthesis with supervision.     Time  4    Period  Weeks    Status  Revised    Target Date  10/20/18      PT SHORT TERM GOAL #5   Title  Patient negotiates stairs with 2 rails, ramp & curb with cane or less & prosthesis with supervision.     Time  4    Period  Weeks  Status  Revised    Target Date  10/20/18        PT Long Term Goals - 09/14/18 2135      PT LONG TERM GOAL #1   Title  Patient verbalizes & demonstrates understanding of prosthetic care to enable safe use of prosthesis (All LTGs Target Date 11/15/2018)    Time  12    Period  Weeks    Status  On-going    Target Date  11/15/18      PT LONG TERM GOAL #2   Title  Patient tolerates prosthesis wear >90% of awake hours without skin or pain issues to enable function throughout her day.     Time  12    Period  Weeks    Status  On-going    Target Date  11/15/18      PT LONG TERM GOAL #3   Title  Berg Balance >/= 45/56 to indicate lower fall  risk.     Time  12    Period  Weeks    Status  On-going    Target Date  11/15/18      PT LONG TERM GOAL #4   Title  Patient ambulates >500' outdoors with LRAD modified independent to enable safe function in community.     Time  12    Period  Weeks    Status  On-going    Target Date  11/15/18      PT LONG TERM GOAL #5   Title  Patient negotiates stairs, ramps & curbs with LRAD & Prosthesis modified independent to enable community access.     Time  12    Period  Weeks    Status  On-going    Target Date  11/15/18      PT LONG TERM GOAL #6   Title  Patient ambulates 64' around furniture with cane or less & prosthesis carrying household items modified independent for household function.     Time  12    Period  Weeks    Status  On-going    Target Date  11/15/18            Plan - 10/04/18 1330    Clinical Impression Statement  Today's skilled session continued to address prosthetic management, activity with prosthesis and balance reactions with no issues reported. The pt is progressing toward goals and should benefit from continued PT to progress toward unmet goals.     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 prosthetic gait with cane for community /outdoors & no device indoors    Consulted and Agree with Plan of Care  Patient       Patient will benefit from skilled therapeutic intervention in order to improve the following deficits and impairments:  Abnormal gait, Decreased activity tolerance,  Decreased balance, Decreased endurance, Decreased knowledge of use of DME, Decreased mobility, Decreased range of motion, Decreased strength, Dizziness, Increased edema, Postural dysfunction, Prosthetic Dependency, Pain  Visit Diagnosis: Abnormal posture  Muscle weakness (generalized)  Unsteadiness on feet  Other abnormalities of gait and mobility     Problem List Patient Active Problem List   Diagnosis Date Noted  . Diarrhea 09/05/2018  . Urinary tract infection 06/30/2018  . ICH (intracerebral hemorrhage) ([place]) 06/27/2018  . DKA (diabetic ketoacidoses) ([place]) 06/26/2018  . V-tach ([place]) 05/14/2018  . Seizure ([place]) 05/14/2018  . Slow transit constipation   . Fall   . Labile blood glucose   . Labile blood pressure   . Chronic combined systolic and diastolic congestive heart failure ([place])   . Chronic diastolic congestive heart failure ([place])   . CKD (chronic kidney disease), stage III ([place])   . Type 2 diabetes mellitus with peripheral neuropathy (HCC)   . Amputation of right lower extremity below knee ([place]) 04/20/2018  . 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 Post-operative pain   . PAF (paroxysmal atrial fibrillation) ([place])   . Duodenal ulcer   . Atrial fibrillation with RVR ([place]) 04/14/2018  . Foot osteomyelitis, right ([place]) 04/14/2018  . Cutaneous abscess of left foot   . Acute respiratory failure with hypoxia  ([place])   . Cellulitis 01/02/2018  . LGI bleed   . Acute blood loss anemia   . Wide-complex tachycardia ([place]) 07/04/2017  . Type II diabetes mellitus, uncontrolled ([place]) 07/04/2017  . Peripheral neuropathy 07/04/2017  . H/O hyperthyroidism 07/04/2017  . Atrial fibrillation with rapid ventricular response ([place]) 08/06/2016  . S/P transmetatarsal amputation of foot, right ([place]) 04/19/2016  . Obstructive sleep apnea 11/26/2015  . Bilateral carpal tunnel syndrome 11/26/2015  . Hypomagnesemia 11/16/2015  . Hyperlipidemia LDL goal <70 10/13/2015  . Abnormality of gait 09/02/2015  . Memory loss 08/12/2015  . Diabetic peripheral neuropathy ([place]) 08/12/2015  . Vitamin D deficiency 08/12/2015  . Hyperthyroidism 04/30/2015  . Heme positive stool   . Persistent atrial fibrillation 04/29/2015  . Coronary artery disease with stable angina pectoris ([place]) 03/29/2015  . Abnormal nuclear stress test   . History of goiter 09/28/2014  . GERD (gastroesophageal reflux disease) 07/31/2013  . Depression with anxiety 06/01/2013  . DM (diabetes mellitus), type 2 with peripheral vascular complications ([place]) 79/06/4095  . HTN (hypertension) 01/30/2013    Willow Ora, PTA, [facility] [address] [place], [place] [address] [phone] 10/04/18, 2:33 PM   Name: Judith [name] MRN: [id number] Date of Birth: 1951/04/02

## 2018-10-04 NOTE — Progress Notes (Signed)
Subjective: 68 y.o. returns the office today for painful, elongated, thickened toenails which she cannot trim herself on the left foot.  She states that she is very pleased with the left foot as there is no hard spots and no open sores.  Denies any redness or drainage around the nails. Denies any acute changes to her lower extremities since last appointment and no new complaints today. Denies any systemic complaints such as fevers, chills, nausea, vomiting.   Since her last appointment she was in the hospital for small brain bleed she reports.  She states that she has recovered well.  PCP: Rutherford Guys, MD Objective: AAO 3, NAD Neurovascular status unchanged Nails hypertrophic, dystrophic, elongated, brittle, discolored 5. There is tenderness overlying the nails 1-5 on the left. There is no surrounding erythema or drainage along the nail sites. No open lesions or pre-ulcerative lesions are identified. No other areas of tenderness. No overlying edema, erythema, increased warmth. No pain with calf compression, swelling, warmth, erythema.  Assessment: Patient presents with symptomatic onychomycosis  Plan: -Treatment options including alternatives, risks, complications were discussed -Nails sharply debrided 5 without complication/bleeding. -Discussed daily foot inspection. If there are any changes, to call the office immediately.  -Follow-up in 3 months or sooner if any problems are to arise. In the meantime, encouraged to call the office with any questions, concerns, changes symptoms.  Celesta Gentile, DPM

## 2018-10-05 ENCOUNTER — Encounter: Payer: Self-pay | Admitting: Physical Therapy

## 2018-10-05 DIAGNOSIS — G4733 Obstructive sleep apnea (adult) (pediatric): Secondary | ICD-10-CM | POA: Diagnosis not present

## 2018-10-05 NOTE — Telephone Encounter (Signed)
Pt has appt on 10/18/2018

## 2018-10-07 ENCOUNTER — Telehealth: Payer: Self-pay | Admitting: Adult Health

## 2018-10-07 ENCOUNTER — Telehealth: Payer: Self-pay | Admitting: Internal Medicine

## 2018-10-07 DIAGNOSIS — Z20822 Contact with and (suspected) exposure to covid-19: Secondary | ICD-10-CM

## 2018-10-07 NOTE — Telephone Encounter (Signed)
Attempted to notify pt and schedule an appointment. No answer, left message for the patient to call back on [phone] until 7 pm tonight and 7am to 7pm on Sunday.

## 2018-10-10 ENCOUNTER — Ambulatory Visit: Payer: Medicare PPO | Attending: Orthopedic Surgery | Admitting: Physical Therapy

## 2018-10-10 ENCOUNTER — Other Ambulatory Visit: Payer: Self-pay

## 2018-10-10 ENCOUNTER — Encounter: Payer: Self-pay | Admitting: Physical Therapy

## 2018-10-10 DIAGNOSIS — M6281 Muscle weakness (generalized): Secondary | ICD-10-CM

## 2018-10-10 DIAGNOSIS — R2681 Unsteadiness on feet: Secondary | ICD-10-CM

## 2018-10-10 DIAGNOSIS — M79661 Pain in right lower leg: Secondary | ICD-10-CM | POA: Insufficient documentation

## 2018-10-10 DIAGNOSIS — R2689 Other abnormalities of gait and mobility: Secondary | ICD-10-CM | POA: Diagnosis not present

## 2018-10-10 DIAGNOSIS — R293 Abnormal posture: Secondary | ICD-10-CM | POA: Diagnosis not present

## 2018-10-10 NOTE — Patient Instructions (Signed)
1. Call doctor about knots in legs. Make sure doctor knows you are still bruising easily. Possibility of being blood clots vs other cause. Call doctor to talk over this issue.  2. Used barrier sock on thigh. Make sure bruise is covered, the barrier sock is not behind knee where crease is when you bend it and that it sticks out at top of liner.   3. You can wear the sandles with the prosthesis without socks.

## 2018-10-11 NOTE — Therapy (Signed)
[facility] [address] [place], [place], [address] Phone: [phone]   Fax:  [phone]  Physical Therapy Treatment  Patient Details  Name: Judith [name] MRN: [id number] Date of Birth: [birth year] Referring Provider (PT): [name], MD   Encounter Date: 10/10/2018   CLINIC OPERATION CHANGES: [facility] is open at lower capacity following universal masking, social distancing, and patient screening.     10/10/18 1225  PT Visits / Re-Eval  Visit Number 9  Number of Visits 25  Date for PT Re-Evaluation 11/15/18  Authorization  Authorization Type [employer] Medicaid- 10th visit progress note  PT Time Calculation  PT Start Time 9381 (pt late due to driving self for 1st time, and needing to get gas. )  PT Stop Time 1245  PT Time Calculation (min) 32 min  PT - End of Session  Equipment Utilized During Treatment Gait belt  Activity Tolerance Patient tolerated treatment well;No increased pain  Behavior During Therapy WFL for tasks assessed/performed    Past Medical History:  Diagnosis Date  . Abnormal EKG 07/31/2013  . Arthritis    "hands" (03/06/2015)  . Asthma   . Carpal tunnel syndrome, bilateral   . Chronic kidney disease   . Complication of anesthesia    slow to wake up. Pt sts she woke up during surgery many years ago.  . Coronary artery disease     2 v CAD with CTO of the RCA and high grade bifurcational LCx/OM stenosis. S/P PCI DES x 2 to the LCx/OM.  Marland Kitchen Diabetic peripheral neuropathy ([place]) "since 1996"  . GERD (gastroesophageal reflux disease)   . Goiter   . Headache    migraines prior to menopause  . History of shingles 06/01/2013  . Hyperlipidemia LDL goal <70 10/13/2015  . Hypertension   . Hyperthyroidism   . Osteomyelitis of foot (HCC)    Right  . PAF (paroxysmal atrial fibrillation) ([place]) 04/29/2015   CHADS2VASC score of 5 now on Apixaban  . Pneumonia ~ 1976  . Sleep  apnea    Bipap  . Tremors of nervous system   . Type II diabetes mellitus (HCC)    insulin dependent    Past Surgical History:  Procedure Laterality Date  . ABDOMINAL HYSTERECTOMY  1988   age 71; CERVICAL DYSPLASIA; ovaries intact.   . AMPUTATION Right 01/23/2016   Procedure: Right 3rd Ray Amputation;  Surgeon: [name], MD;  Location: [facility];  Service: Orthopedics;  Laterality: Right;  . AMPUTATION Right 02/13/2016   Procedure: Right Transmetatarsal Amputation;  Surgeon: [name], MD;  Location: [facility];  Service: Orthopedics;  Laterality: Right;  . AMPUTATION Right 04/15/2018   Procedure: AMPUTATION BELOW KNEE;  Surgeon: [name], MD;  Location: [facility];  Service: Orthopedics;  Laterality: Right;  . BIOPSY  04/16/2018   Procedure: BIOPSY;  Surgeon: Irene Shipper, MD;  Location: [facility] ENDOSCOPY;  Service: Endoscopy;;  . CARDIAC CATHETERIZATION N/A 02/27/2015   Procedure: Left Heart Cath and Coronary Angiography;  Surgeon: [name], MD; LAD 40%, mCFX 80%, OM 70%, RCA 100% calcified       . CARDIAC CATHETERIZATION N/A 03/06/2015   Procedure: Coronary Stent Intervention;  Surgeon: [name], MD;  Location: [facility];  Service: Cardiovascular;  Laterality: N/A;  Mid CX 3.50x12 promus DES w/ 0% resdual and Prox OM1 2.50x20 promus DES w/ 20% residual  . CARDIOVERSION    . CARDIOVERSION N/A 05/22/2018  Procedure: CARDIOVERSION;  Surgeon: Skeet Latch, MD;  Location: [facility];  Service: Cardiovascular;  Laterality: N/A;  . CARPAL TUNNEL RELEASE Right Nov 2015  . CARPAL TUNNEL RELEASE Right 1992; 05/2014   [name]; [place]  . CESAREAN SECTION  1982; 1984  . ESOPHAGOGASTRODUODENOSCOPY (EGD) WITH PROPOFOL N/A 04/16/2018   Procedure: ESOPHAGOGASTRODUODENOSCOPY (EGD) WITH PROPOFOL;  Surgeon: Irene Shipper, MD;  Location: [facility] ENDOSCOPY;  Service: Endoscopy;  Laterality: N/A;  . FOOT NEUROMA SURGERY Bilateral 2000  . I&D EXTREMITY Left 01/06/2018   Procedure:  DEBRIDEMENT ULCER LEFT FOOT;  Surgeon: [name], MD;  Location: [facility];  Service: Orthopedics;  Laterality: Left;  . KNEE ARTHROSCOPY Right ~ 2003   "meniscus repair"  . SHOULDER OPEN ROTATOR CUFF REPAIR Right 1996; 1998   "w/fracture repair"  . THYROID SURGERY  2000   "removed lots of nodules"  . TONSILLECTOMY  1976    There were no vitals filed for this visit.     10/10/18 1215  Symptoms/Limitations  Subjective No new falls. Did have an incident ouside next to her little garage. She shut the door and her fingers got caught under the door about her shoulder level and she could not get her fingers out. She yelled for help and her neighbor came after a few minutes (unsure how long, about 10 mintues or so). Now with finger tip numbness. Has not seen the doctor about this. Bruise on back of thigh is getting bigger. Pt now feels it's not due to [facility], it's from prosthesis.   Pertinent History R TTA, asthma, CAD, IDDM2, neuropathy, shingles, HTN, Left non-traumatic intracerebral hemorrhage, CKD stage 3, right shoulder impingement syndrome  Limitations Lifting;Standing;Walking;House hold activities  Patient Stated Goals To use prosthesis to walk in community & home, gardening, [place]      10/10/18 1256  Transfers  Transfers Sit to Stand;Stand to Sit  Sit to Stand 5: Supervision;With upper extremity assist;From chair/3-in-1;With armrests  Stand to Sit 5: Supervision;With upper extremity assist;To chair/3-in-1;With armrests  Ambulation/Gait  Ambulation/Gait Yes  Ambulation/Gait Assistance 5: Supervision;4: Min guard  Ambulation/Gait Assistance Details cues on posture and weight shifting with gait    Ambulation Distance (Feet) 100 Feet (x2 with cane; around gym no AD)  Assistive device Straight cane;Prosthesis;None  Gait Pattern Step-through pattern;Decreased step length - left;Decreased stance time - right;Narrow base of support  Ambulation Surface Level;Indoor  Stairs  Yes  Stairs Assistance 5: Supervision  Stairs Assistance Details (indicate cue type and reason) continues to need reminder cues for prosthetic foot placement and weight shifting on stairs  Stair Management Technique Two rails;Alternating pattern;Forwards  Number of Stairs 4 (x 2 reps)  Prosthetics  Prosthetic Care Comments  pt educated on use of barrier sock as it appers the liner is creasing which is pinching her skin causing a bruise. written instructions provided. also spent increased time on sock ply management, increasing in 1 ply increments to better help pt's understanding. discussion with primary PT on new areas of knotting in her legs. Primary PT advised pt to follow up with MD to r/o any blood clotting issues vs other causes. pt verbalized understanding and plans to call today.   Current prosthetic wear tolerance (days/week)  daily  Current prosthetic wear tolerance (#hours/day)  most of awake hours  Residual limb condition  scratches are healing, no new ones noted. Pt does have a bruise on back of thigh on residual limb. states this is from her [facility] at home.  Donning Prosthesis 5  Doffing Prosthesis 6        PT Short Term Goals - 09/28/18 1525      PT SHORT TERM GOAL #1   Title  Patient demonstrates proper adjustment of ply socks with limb volume changes. (All STGs Target Date 10/20/2018)    Time  4    Period  Weeks    Status  Revised    Target Date  10/20/18      PT SHORT TERM GOAL #2   Title  Patient tolerates prosthesis wear >12hrs total /day without skin issues.     Time  4    Period  Weeks    Status  Revised    Target Date  10/20/18      PT SHORT TERM GOAL #3   Title  Berg Balance >/= 40/56    Time  4    Period  Weeks    Status  New    Target Date  10/20/18      PT SHORT TERM GOAL #4   Title  Patient ambulates 300' with cane or less & prosthesis with supervision.     Time  4    Period  Weeks    Status  Revised    Target Date  10/20/18      PT SHORT TERM  GOAL #5   Title  Patient negotiates stairs with 2 rails, ramp & curb with cane or less & prosthesis with supervision.     Time  4    Period  Weeks    Status  Revised    Target Date  10/20/18        PT Long Term Goals - 09/14/18 2135      PT LONG TERM GOAL #1   Title  Patient verbalizes & demonstrates understanding of prosthetic care to enable safe use of prosthesis (All LTGs Target Date 11/15/2018)    Time  12    Period  Weeks    Status  On-going    Target Date  11/15/18      PT LONG TERM GOAL #2   Title  Patient tolerates prosthesis wear >90% of awake hours without skin or pain issues to enable function throughout her day.     Time  12    Period  Weeks    Status  On-going    Target Date  11/15/18      PT LONG TERM GOAL #3   Title  Berg Balance >/= 45/56 to indicate lower fall risk.     Time  12    Period  Weeks    Status  On-going    Target Date  11/15/18      PT LONG TERM GOAL #4   Title  Patient ambulates >500' outdoors with LRAD modified independent to enable safe function in community.     Time  12    Period  Weeks    Status  On-going    Target Date  11/15/18      PT LONG TERM GOAL #5   Title  Patient negotiates stairs, ramps & curbs with LRAD & Prosthesis modified independent to enable community access.     Time  12    Period  Weeks    Status  On-going    Target Date  11/15/18      PT LONG TERM GOAL #6   Title  Patient ambulates 2' around furniture with cane or less & prosthesis carrying household items modified independent for household  function.     Time  12    Period  Weeks    Status  On-going    Target Date  11/15/18          10/10/18 1255  Plan  Clinical Impression Statement Today's skilled session focused on prosthetic education and gait/stairs with prosthesis. Pt continues to need cues on sock ply management. Does well when instructions are written down for her to rewrite and review at home. The pt is progressing toward goals and should benefit  from continued PT to progress toward unmet goals.   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)  Pt will benefit from skilled therapeutic intervention in order to improve on the following deficits Abnormal gait;Decreased activity tolerance;Decreased balance;Decreased endurance;Decreased knowledge of use of DME;Decreased mobility;Decreased range of motion;Decreased strength;Dizziness;Increased edema;Postural dysfunction;Prosthetic Dependency;Pain  Stability/Clinical Decision Making Evolving/Moderate complexity  Rehab Potential Good  PT Frequency 2x / week  PT Duration 12 weeks  PT Treatment/Interventions ADLs/Self Care Home Management;Canalith Repostioning;Moist Heat;DME Instruction;Gait training;Stair training;Functional mobility training;Therapeutic activities;Therapeutic exercise;Balance training;Neuromuscular re-education;Patient/family education;Prosthetic Training;Taping;Vestibular  PT Next Visit Plan balance training to facilitate ankle/residual limb, hip & step strategies. prosthetic gait with cane for community /outdoors & no device indoors  Consulted and Agree with Plan of Care Patient         Patient will benefit from skilled therapeutic intervention in order to improve the following deficits and impairments:  Abnormal gait, Decreased activity tolerance, Decreased balance, Decreased endurance, Decreased knowledge of use of DME, Decreased mobility, Decreased range of motion, Decreased strength, Dizziness, Increased edema, Postural dysfunction, Prosthetic Dependency, Pain  Visit  Diagnosis: Abnormal posture  Muscle weakness (generalized)  Unsteadiness on feet  Other abnormalities of gait and mobility     Problem List Patient Active Problem List   Diagnosis Date Noted  . Diarrhea 09/05/2018  . Urinary tract infection 06/30/2018  . ICH (intracerebral hemorrhage) ([place]) 06/27/2018  . DKA (diabetic ketoacidoses) ([place]) 06/26/2018  . V-tach ([place]) 05/14/2018  . Seizure ([place]) 05/14/2018  . Slow transit constipation   . Fall   . Labile blood glucose   . Labile blood pressure   . Chronic combined systolic and diastolic congestive heart failure ([place])   . Chronic diastolic congestive heart failure ([place])   . CKD (chronic kidney disease), stage III ([place])   . Type 2 diabetes mellitus with peripheral neuropathy (HCC)   . Amputation of right lower extremity below knee ([place]) 04/20/2018  . 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 Post-operative pain   . PAF (paroxysmal atrial fibrillation) ([place])   . Duodenal ulcer   . Atrial fibrillation with RVR ([place]) 04/14/2018  . Foot osteomyelitis, right ([place]) 04/14/2018  . Cutaneous abscess of left foot   . Acute respiratory failure with hypoxia ([place])   . Cellulitis 01/02/2018  . LGI bleed   . Acute blood loss anemia   . Wide-complex tachycardia ([place]) 07/04/2017  . Type II diabetes mellitus, uncontrolled ([place]) 07/04/2017  . Peripheral neuropathy 07/04/2017  . H/O hyperthyroidism 07/04/2017  . Atrial fibrillation with rapid ventricular response ([place]) 08/06/2016  . S/P transmetatarsal amputation of foot, right ([place]) 04/19/2016  . Obstructive sleep apnea 11/26/2015  . Bilateral carpal tunnel syndrome 11/26/2015  . Hypomagnesemia 11/16/2015  . Hyperlipidemia  LDL goal <70 10/13/2015  . Abnormality of gait 09/02/2015  . Memory loss 08/12/2015  . Diabetic peripheral neuropathy ([place]) 08/12/2015  . Vitamin D deficiency 08/12/2015  . Hyperthyroidism 04/30/2015  .  Heme positive stool   . Persistent atrial fibrillation 04/29/2015  . Coronary artery disease with stable angina pectoris ([place]) 03/29/2015  . Abnormal nuclear stress test   . History of goiter 09/28/2014  . GERD (gastroesophageal reflux disease) 07/31/2013  . Depression with anxiety 06/01/2013  . DM (diabetes mellitus), type 2 with peripheral vascular complications ([place]) 15/40/0867  . HTN (hypertension) 01/30/2013    Willow Ora, PTA, [facility] [address] [place], [place] [address] [phone] 10/11/18, 10:33 PM    Name: Judith [name] MRN: [id number] Date of Birth: October 26, 1950

## 2018-10-11 NOTE — Telephone Encounter (Signed)
The patient was prescribed Hydralazine when she left the ICU in [place]. She was taking 50 mg, TID. She requested a refill of this medication.

## 2018-10-12 ENCOUNTER — Ambulatory Visit: Payer: Medicare PPO | Admitting: Physical Therapy

## 2018-10-12 ENCOUNTER — Telehealth: Payer: Self-pay | Admitting: Cardiology

## 2018-10-12 ENCOUNTER — Other Ambulatory Visit: Payer: Self-pay

## 2018-10-12 ENCOUNTER — Ambulatory Visit: Payer: Self-pay | Admitting: Family Medicine

## 2018-10-12 NOTE — Telephone Encounter (Signed)
lmtcb

## 2018-10-12 NOTE — Telephone Encounter (Signed)
Pt. Reports she woke up during the night with shortness of breath. Uses BIPAP at home at night. States she has had CHF in the past and " it feels like that." No chest pain this morning. Swelling to left ankle as well.Wants to know if Dr. Pamella Pert can see her or should she call her cardiologist. Damaris Schooner with [name]. Please advise pt.  Answer Assessment - Initial Assessment Questions 1. RESPIRATORY STATUS: "Describe your breathing?" (e.g., wheezing, shortness of breath, unable to speak, severe coughing)      Shortness of breath 2. ONSET: "When did this breathing problem begin?"      Started during the night 3. PATTERN "Does the difficult breathing come and go, or has it been constant since it started?"      Comes and goes . Worse with exertion. 4. SEVERITY: "How bad is your breathing?" (e.g., mild, moderate, severe)    - MILD: No SOB at rest, mild SOB with walking, speaks normally in sentences, can lay down, no retractions, pulse < 100.    - MODERATE: SOB at rest, SOB with minimal exertion and prefers to sit, cannot lie down flat, speaks in phrases, mild retractions, audible wheezing, pulse 100-120.    - SEVERE: Very SOB at rest, speaks in single words, struggling to breathe, sitting hunched forward, retractions, pulse > 120      Moderate 5. RECURRENT SYMPTOM: "Have you had difficulty breathing before?" If so, ask: "When was the last time?" and "What happened that time?"      Yes 6. CARDIAC HISTORY: "Do you have any history of heart disease?" (e.g., heart attack, angina, bypass surgery, angioplasty)      Yes 7. LUNG HISTORY: "Do you have any history of lung disease?"  (e.g., pulmonary embolus, asthma, emphysema)     COPD 8. CAUSE: "What do you think is causing the breathing problem?"      CHF 9. OTHER SYMPTOMS: "Do you have any other symptoms? (e.g., dizziness, runny nose, cough, chest pain, fever)     No 10. PREGNANCY: "Is there any chance you are pregnant?" "When was your last menstrual  period?"       No 11. TRAVEL: "Have you traveled out of the country in the last month?" (e.g., travel history, exposures)       No  Protocols used: BREATHING DIFFICULTY-A-AH

## 2018-10-12 NOTE — Telephone Encounter (Signed)
Called pt,Ptt has appt with cardiologist tomorrow she will call us back . FR

## 2018-10-12 NOTE — Telephone Encounter (Signed)
Tried to reach pt re: in office visit for 10/13/2018 and to do covid prescreen questions.  Pt wasn't available.

## 2018-10-12 NOTE — Progress Notes (Signed)
Cardiology Office Note   Date:  10/13/2018   ID:  Judith, [name] 09-11-1950, MRN [id number]  PCP:  Rutherford Guys, MD  Cardiologist:  Dr. Radford Pax     Chief Complaint  Patient presents with   Shortness of Breath      History of Present Illness: Judith [name] is a 69 y.o. 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  While in the hospital she had NSVT versus aberrancy and sotalol was stopped.  Patient had recurrent A. fib and underwent cardioversion 05/22/2018 but unfortunately was back in A. fib with RVR when she saw Roderic Palau, NP 05/30/2018.  She was on amiodarone load and Eliquis.  She could not increase her diltiazem further because of soft blood pressures Stop her Lasix.  She recommended that the patient could continue to load on amiodarone and bring her back in 10 days to arrange for cardioversion.  Back in the A. fib clinic 06/08/2018 was back in normal sinus rhythm.  Amiodarone decreased to 200 mg daily.  Was also in ER with low magnesium and Lasix was decreased to 20 mg daily.  On last visit 06/21/18 she was complaining of shoulder pain and needs rotator cuff surgery so she can do rehab on her BKA.  She has had no further fluid buildup.  She is getting regular magnesium checks and infusions because the Lasix has dropped her magnesium.  She is down to 20 mg of Lasix daily.  She was most recently hospitalized in February at [facility] for DKA and incidental L thalamic ICH.  on amiodarone and eliquis this was resumed at discharge.    Today she has had some SOB and chest tightness.  She had one episode at night she developed acute SOB and had trouble wearing her BiPAP.  Her wt was up !& lbs but she has her prostheses on..  She continues with some lower ext  edema of Lt leg.  Her rt thigh she feels knots, some have been felt to be cysts and others unsure, she has been seen by ortho.   Her diabetes is stable.  Her EKG is stable.  No recent a fib.  Past Medical History:  Diagnosis Date   Abnormal EKG 07/31/2013   Arthritis    "hands" (03/06/2015)   Asthma    Carpal tunnel syndrome, bilateral    Chronic kidney disease    Complication of anesthesia    slow to wake up. Pt sts she woke up during surgery many years ago.   Coronary artery disease     2 v CAD with CTO of the RCA and high grade bifurcational LCx/OM stenosis. S/P PCI DES x 2 to the LCx/OM.   Diabetic peripheral neuropathy ([place]) "since 1996"   GERD (gastroesophageal reflux disease)    Goiter    Headache    migraines prior to menopause   History of shingles 06/01/2013   Hyperlipidemia LDL goal <70 10/13/2015   Hypertension    Hyperthyroidism    Osteomyelitis of foot (HCC)    Right   PAF (paroxysmal atrial fibrillation) ([place]) 04/29/2015   CHADS2VASC score of 5 now on Apixaban   Pneumonia ~ 1976   Sleep apnea    Bipap   Tremors of nervous system  Type II diabetes mellitus (HCC)    insulin dependent    Past Surgical History:  Procedure Laterality Date   ABDOMINAL HYSTERECTOMY  55   age 59; CERVICAL DYSPLASIA; ovaries intact.    AMPUTATION Right 01/23/2016   Procedure: Right 3rd Ray Amputation;  Surgeon: [name], MD;  Location: [place];  Service: Orthopedics;  Laterality: Right;   AMPUTATION Right 02/13/2016   Procedure: Right Transmetatarsal Amputation;  Surgeon: [name], MD;  Location: [place];  Service: Orthopedics;  Laterality: Right;   AMPUTATION Right 04/15/2018   Procedure: AMPUTATION BELOW KNEE;  Surgeon: [name], MD;  Location: [place];  Service: Orthopedics;  Laterality: Right;   BIOPSY  04/16/2018   Procedure: BIOPSY;  Surgeon: Irene Shipper, MD;  Location: [facility] ENDOSCOPY;  Service: Endoscopy;;   CARDIAC CATHETERIZATION N/A  02/27/2015   Procedure: Left Heart Cath and Coronary Angiography;  Surgeon: [name], MD; LAD 40%, mCFX 80%, OM 70%, RCA 100% calcified        CARDIAC CATHETERIZATION N/A 03/06/2015   Procedure: Coronary Stent Intervention;  Surgeon: [name], MD;  Location: [place] [facility];  Service: Cardiovascular;  Laterality: N/A;  Mid CX 3.50x12 promus DES w/ 0% resdual and Prox OM1 2.50x20 promus DES w/ 20% residual   CARDIOVERSION     CARDIOVERSION N/A 05/22/2018   Procedure: CARDIOVERSION;  Surgeon: Skeet Latch, MD;  Location: [place];  Service: Cardiovascular;  Laterality: N/A;   CARPAL TUNNEL RELEASE Right Nov 2015   CARPAL TUNNEL RELEASE Right 1992; 05/2014   [name]; [place]; 1984   ESOPHAGOGASTRODUODENOSCOPY (EGD) WITH PROPOFOL N/A 04/16/2018   Procedure: ESOPHAGOGASTRODUODENOSCOPY (EGD) WITH PROPOFOL;  Surgeon: Irene Shipper, MD;  Location: [facility] ENDOSCOPY;  Service: Endoscopy;  Laterality: N/A;   FOOT NEUROMA SURGERY Bilateral 2000   I&D EXTREMITY Left 01/06/2018   Procedure: DEBRIDEMENT ULCER LEFT FOOT;  Surgeon: [name], MD;  Location: [place];  Service: Orthopedics;  Laterality: Left;   KNEE ARTHROSCOPY Right ~ 2003   "meniscus repair"   SHOULDER OPEN ROTATOR CUFF REPAIR Right 1996; 1998   "w/fracture repair"   THYROID SURGERY  2000   "removed lots of nodules"   TONSILLECTOMY  1976     Current Outpatient Medications  Medication Sig Dispense Refill   acetaminophen (TYLENOL) 325 MG tablet Take 1-2 tablets (325-650 mg total) by mouth every 6 (six) hours as needed for mild pain (pain score 1-3 or temp > 100.5).     albuterol (PROVENTIL HFA) 108 (90 Base) MCG/ACT inhaler Inhale 1-2 puffs into the lungs every 6 (six) hours as needed for wheezing or shortness of breath. 1 Inhaler 0   amiodarone (PACERONE) 200 MG tablet Take 1 tablet (200 mg total) by mouth daily. 30 tablet 6   apixaban (ELIQUIS) 5 MG TABS tablet Take 1  tablet (5 mg total) by mouth 2 (two) times daily. 180 tablet 3   atorvastatin (LIPITOR) 20 MG tablet Take 1 tablet (20 mg total) by mouth daily. 90 tablet 3   buPROPion (WELLBUTRIN SR) 150 MG 12 hr tablet Take 1 tablet (150 mg total) by mouth 2 (two) times daily. 60 tablet 1   diltiazem (CARDIZEM CD) 120 MG 24 hr capsule Take 1 capsule (120 mg total) by mouth daily. 90 capsule 1   famotidine (PEPCID) 20 MG tablet Take 1 tablet (20 mg total) by mouth 2 (two) times daily. 60 tablet 3   fenofibrate 160 MG tablet  Take 1 tablet (160 mg total) by mouth daily. 30 tablet 1   FLUoxetine (PROZAC) 20 MG capsule TAKE 1 CAPSULE AT BEDTIME 90 capsule 1   hydrALAZINE (APRESOLINE) 50 MG tablet Take 50 mg by mouth 3 (three) times daily.     magnesium oxide (MAG-OX) 400 (241.3 Mg) MG tablet TAKE 1 TABLET EVERY DAY 90 tablet 0   metFORMIN (GLUCOPHAGE) 1000 MG tablet Take 1 tablet (1,000 mg total) by mouth daily with breakfast. 30 tablet 11   methimazole (TAPAZOLE) 10 MG tablet TAKE 1 TABLET (10 MG TOTAL) BY MOUTH DAILY. 90 tablet 0   metoprolol succinate (TOPROL-XL) 25 MG 24 hr tablet Take 25 mg by mouth 2 (two) times daily.     mupirocin ointment (BACTROBAN) 2 % Apply 1 application topically 3 (three) times daily. 22 g 1   traMADol (ULTRAM) 50 MG tablet Take 50 mg by mouth every 6 (six) hours as needed.     TRESIBA FLEXTOUCH 100 UNIT/ML SOPN FlexTouch Pen INJECT 20 UNITS SUBCUTANEOUSLY AT BEDTIME 15 mL 0   TRUE METRIX BLOOD GLUCOSE TEST test strip TEST BLOOD SUGAR TWICE DAILY 100 each 3   No current facility-administered medications for this visit.     Allergies:   Contrast media [iodinated diagnostic agents]; Novolog [insulin aspart]; Codeine; Iodine; Penicillins; Ace inhibitors; Demerol [meperidine]; Dilaudid [hydromorphone hcl]; Neosporin [neomycin-bacitracin zn-polymyx]; Percocet [oxycodone-acetaminophen]; and Tape    Social History:  The patient  reports that she quit smoking about 3 years  ago. Her smoking use included cigarettes. She smoked 0.00 packs per day for 41.00 years. She has never used smokeless tobacco. She reports that she does not drink alcohol or use drugs.   Family History:  The patient's family history includes Allergies in her sister; Breast cancer in her maternal grandmother and mother; COPD in her father; Cancer (age of onset: 45) in her mother; Emphysema in her maternal grandfather and paternal grandfather; Heart attack in her father; Heart disease (age of onset: 24) in her father; Hypertension in her father; Leukemia in her paternal grandmother; Lung cancer in her mother; Parkinson's disease in her father.    ROS:  General:no colds or fevers, no weight changes Skin:no rashes or ulcers HEENT:no blurred vision, no congestion CV:see HPI PUL:see HPI GI:no diarrhea constipation or melena, no indigestion GU:no hematuria, no dysuria MS:no joint pain, no claudication, Rt BKA  Neuro:no syncope, no lightheadedness Endo:+ diabetes, no thyroid disease  Wt Readings from Last 3 Encounters:  10/13/18 167 lb 12.8 oz (76.1 kg)  09/29/18 150 lb (68 kg)  07/17/18 150 lb (68 kg)     PHYSICAL EXAM: VS:  BP (!) 144/80    Pulse 60    Ht 5' 4" (1.626 m)    Wt 167 lb 12.8 oz (76.1 kg)    SpO2 99%    BMI 28.80 kg/m  , BMI Body mass index is 28.8 kg/m. General:Pleasant affect, NAD Skin:Warm and dry, brisk capillary refill HEENT:normocephalic, sclera clear, mucus membranes moist Neck:supple, no JVD, no bruits  Heart:S1S2 RRR with 3/6 systolic murmur, no gallup, rub or click Lungs:clear without rales, rhonchi, or wheezes FIE:PPIR, non tender, + BS, do not palpate liver spleen or masses Ext:1+Lt  lower ext edema, 2+ pedal pulse on Lt., 2+ radial pulses Rt BKA Neuro:alert and oriented X 3, MAE, follows commands, + facial symmetry    EKG:  EKG is ordered today. The ekg ordered today demonstrates SR at 60 with artifact and no ST changes from prior.  Recent  Labs: 05/15/2018: TSH 1.013 07/07/2018: ALT 28; BUN 22; Creatinine, Ser 1.08; Hemoglobin 10.5; Magnesium 2.0; Platelets 490; Potassium 5.0; Sodium 138    Lipid Panel    Component Value Date/Time   CHOL 236 (H) 07/22/2017 0857   TRIG 544 (H) 07/22/2017 0857   HDL 30 (L) 07/22/2017 0857   CHOLHDL 7.9 (H) 07/22/2017 0857   CHOLHDL 5.8 (H) 02/05/2016 1047   VLDL 76 (H) 02/05/2016 1047   [place] Comment 07/22/2017 0857   LDLDIRECT 97 01/06/2017 0904       Other studies Reviewed: Additional studies/ records that were reviewed today include:.  Echo 05/16/18 Study Conclusions  - Left ventricle: The cavity size was normal. Wall thickness was   increased in a pattern of severe LVH. Systolic function was   normal. The estimated ejection fraction was in the range of 60%   to 65%. Wall motion was normal; there were no regional wall   motion abnormalities. There was an increased relative   contribution of atrial contraction to ventricular filling.   Doppler parameters are consistent with abnormal left ventricular   relaxation (grade 1 diastolic dysfunction). Doppler parameters   are consistent with high ventricular filling pressure. - Aortic valve: Valve mobility was restricted. There was mild to   moderate stenosis. Mean gradient (S): 17 mm Hg. VTI ratio of LVOT   to aortic valve: 0.36. Valve area (VTI): 1.37 cm^2. Valve area   (Vmax): 1.22 cm^2. Valve area (Vmean): 1.27 cm^2. - Mitral valve: Calcified annulus. Valve area by pressure   half-time: 2.39 cm^2. - Left atrium: The atrium was moderately dilated. - Pulmonary arteries: Systolic pressure could not be accurately   estimated.  03/06/15: LHC/PCI Left Anterior Descending  Prox LAD to Mid LAD lesion 40% stenosed  calcified diffuse.  Left Circumflex  Mid Cx lesion 80% stenosed  calcified.  Second Obtuse Marginal Branch  Ost 2nd Mrg to 2nd Mrg lesion 70% stenosed  calcified diffuse.  Right Coronary Artery  Collaterals  Dist  RCA filled by collaterals from Dist LAD.    Prox RCA lesion 100% stenosed  calcified diffuse.  Intervention   Mid Cx lesion  Angioplasty  Pre-stent angioplasty was performed. A drug-eluting stent was placed. A post-stent angioplasty was not performed. Maximum pressure: 16 atm. The pre-interventional distal flow is normal (TIMI 3). The post-interventional distal flow is normal (TIMI 3). The intervention was successful. No complications occured at this lesion. IC nitroglycerin was given. The mid circumflex/obtuse marginal bifurcation is treated. There is complex bifurcation stenosis present. I planned on using a bifurcation stenting technique. Angiomax is used for anticoagulation. The patient is adequately preloaded with aspirin and Plavix. A 7 French XB 3.5 cm guide catheter was utilized. Once a therapeutic ACT was achieved, a cougar guidewire is advanced into the OM. A second cougar guidewire is advanced into the distal circumflex. The obtuse marginal branches predilated with a 2.5 x 20 mm noncompliant balloon. The circumflex is then predilated with a 3.0 mm balloon. With the balloon present in the circumflex, a 2.5 x 20 mm Promus DES his advanced into the OM branch. The stent is deployed so that the ostium is covered. Simultaneously, the circumflex balloon is dilated in order to keep the circumflex portion patent. The circumflex stent is then advanced so that it lays across the OM origin. A 3.5 x 12 mm Promus DES was chosen. This stent is deployed at 14 atm. I tried to rewire the OM branch but was unsuccessful. I decided  to post dilate the circumflex stent in order to open the cells better. A 4.0 mm noncompliant balloon is chosen. It is dilated to 16 atm. I was then able to wire the obtuse marginal branch with a whisper wire. The OM stent is postdilated with a 2.5 mm noncompliant balloon. A final kissing balloon is done with the 4.0 mm balloon and the circumflex and the 2.5 mm balloon in the obtuse  marginal. At the completion of the procedure there is 0% residual stenosis in the circumflex and 20% residual stenosis at the obtuse marginal origin. There is TIMI-3 flow into both vessels.  There is a 0% residual stenosis post intervention.  Ost 2nd Mrg to 2nd Mrg lesion  Angioplasty  Pre-stent angioplasty was performed. A drug-eluting stent was placed. A post-stent angioplasty was not performed. The pre-interventional distal flow is normal (TIMI 3). The post-interventional distal flow is normal (TIMI 3). The intervention was successful. No complications occured at this lesion. IC nitroglycerin was given. See description under LCx lesion, bifurcation stenting technique utilized  There is a 20% residual stenosis post intervention.       ASSESSMENT AND PLAN:  1.  Chest tightness will check lexiscan myoview  2.  CAD with hx of PCI now with angina 3.  Chronic combined systolic and diastolic HF.  With increase of edema and SOB, will add lasix 20 mg daily and KDur  10 meq daily. 4.  AS -moderate on cath 05/2018 5.  PAF maintaining SR  6.  Hypomagnesemia will recheck today.  She has had several IV replacements and is on po supplement. 7.  OSA on bipap. Stable.  8.  HLD on statin continue  9.  hemorrhoids with melena X 1 will check CBC  Virtual follow up in 2-3 weeks depending on tests.     Current medicines are reviewed with the patient today.  The patient Has no concerns regarding medicines.  The following changes have been made:  See above Labs/ tests ordered today include:see above  Disposition:   FU:  see above  Signed, Cecilie Kicks, NP  10/13/2018 10:03 AM    [address], [place], [place], [place] Phone: [phone]; Fax: [phone]

## 2018-10-12 NOTE — Telephone Encounter (Signed)
Left another message to call back

## 2018-10-12 NOTE — Telephone Encounter (Signed)
Pt needs appt virtual or telemed only due to symptoms with [place]

## 2018-10-12 NOTE — Therapy (Signed)
[facility] [address] [place], [place], [address] Phone: [phone]   Fax:  [phone]  Physical Therapy Treatment  Patient Details  Name: Judith [name] MRN: [id number] Date of Birth: 1951/05/10 Referring Provider (PT): [name], MD   Encounter Date: 10/12/2018  PT End of Session - 10/12/18 1237    Visit Number  9   no change, arrived no charge   Number of Visits  25    Date for PT Re-Evaluation  11/15/18    Authorization Type  [employer] & Medicaid- 10th visit progress note    PT Start Time  1208    PT Stop Time  1225   no charge   PT Time Calculation (min)  17 min    Equipment Utilized During Treatment  Gait belt    Activity Tolerance  Patient tolerated treatment well;No increased pain    Behavior During Therapy  WFL for tasks assessed/performed       Past Medical History:  Diagnosis Date  . Abnormal EKG 07/31/2013  . Arthritis    "hands" (03/06/2015)  . Asthma   . Carpal tunnel syndrome, bilateral   . Chronic kidney disease   . Complication of anesthesia    slow to wake up. Pt sts she woke up during surgery many years ago.  . Coronary artery disease     2 v CAD with CTO of the RCA and high grade bifurcational LCx/OM stenosis. S/P PCI DES x 2 to the LCx/OM.  Marland Kitchen Diabetic peripheral neuropathy ([place]) "since 1996"  . GERD (gastroesophageal reflux disease)   . Goiter   . Headache    migraines prior to menopause  . History of shingles 06/01/2013  . Hyperlipidemia LDL goal <70 10/13/2015  . Hypertension   . Hyperthyroidism   . Osteomyelitis of foot (HCC)    Right  . PAF (paroxysmal atrial fibrillation) ([place]) 04/29/2015   CHADS2VASC score of 5 now on Apixaban  . Pneumonia ~ 1976  . Sleep apnea    Bipap  . Tremors of nervous system   . Type II diabetes mellitus (HCC)    insulin dependent    Past Surgical History:  Procedure Laterality Date  . ABDOMINAL HYSTERECTOMY  1988   age 12; CERVICAL  DYSPLASIA; ovaries intact.   . AMPUTATION Right 01/23/2016   Procedure: Right 3rd Ray Amputation;  Surgeon: [name], MD;  Location: [facility];  Service: Orthopedics;  Laterality: Right;  . AMPUTATION Right 02/13/2016   Procedure: Right Transmetatarsal Amputation;  Surgeon: [name], MD;  Location: [facility];  Service: Orthopedics;  Laterality: Right;  . AMPUTATION Right 04/15/2018   Procedure: AMPUTATION BELOW KNEE;  Surgeon: [name], MD;  Location: [facility];  Service: Orthopedics;  Laterality: Right;  . BIOPSY  04/16/2018   Procedure: BIOPSY;  Surgeon: Irene Shipper, MD;  Location: [facility] ENDOSCOPY;  Service: Endoscopy;;  . CARDIAC CATHETERIZATION N/A 02/27/2015   Procedure: Left Heart Cath and Coronary Angiography;  Surgeon: [name], MD; LAD 40%, mCFX 80%, OM 70%, RCA 100% calcified       . CARDIAC CATHETERIZATION N/A 03/06/2015   Procedure: Coronary Stent Intervention;  Surgeon: [name], MD;  Location: [facility];  Service: Cardiovascular;  Laterality: N/A;  Mid CX 3.50x12 promus DES w/ 0% resdual and Prox OM1 2.50x20 promus DES w/ 20% residual  . CARDIOVERSION    . CARDIOVERSION N/A 05/22/2018   Procedure: CARDIOVERSION;  Surgeon: Skeet Latch, MD;  Location:  [facility] ENDOSCOPY;  Service: Cardiovascular;  Laterality: N/A;  . CARPAL TUNNEL RELEASE Right Nov 2015  . CARPAL TUNNEL RELEASE Right 1992; 05/2014   [name]; [place]  . CESAREAN SECTION  1982; 1984  . ESOPHAGOGASTRODUODENOSCOPY (EGD) WITH PROPOFOL N/A 04/16/2018   Procedure: ESOPHAGOGASTRODUODENOSCOPY (EGD) WITH PROPOFOL;  Surgeon: Irene Shipper, MD;  Location: [facility] ENDOSCOPY;  Service: Endoscopy;  Laterality: N/A;  . FOOT NEUROMA SURGERY Bilateral 2000  . I&D EXTREMITY Left 01/06/2018   Procedure: DEBRIDEMENT ULCER LEFT FOOT;  Surgeon: [name], MD;  Location: [facility];  Service: Orthopedics;  Laterality: Left;  . KNEE ARTHROSCOPY Right ~ 2003   "meniscus repair"  . SHOULDER OPEN ROTATOR CUFF REPAIR Right  1996; 1998   "w/fracture repair"  . THYROID SURGERY  2000   "removed lots of nodules"  . TONSILLECTOMY  1976    There were no vitals filed for this visit.  Subjective Assessment - 10/12/18 1240    Subjective  Pt arrived late to clinic. Reports she woke up around 5am with shortness of breath like what she had the last time she went into heart failure. Better now, only occurs with exertion. Pt advised she should see her MD before continuing PT due to this causes exertion.     Pertinent History  R TTA, asthma, CAD, IDDM2, neuropathy, shingles, HTN, Left non-traumatic intracerebral hemorrhage, CKD stage 3, right shoulder impingement syndrome    Limitations  Lifting;Standing;Walking;House hold activities    Patient Stated Goals  To use prosthesis to walk in community & home, gardening, going [place] swimming    Currently in Pain?  No/denies    Pain Score  0-No pain       Pt's SaO2 level was 96% after walking into gym. HR 67. Pt has called her cardiology office and left a message. Per Epic they have called her back and left 2 messages. Pt's cell phone checked with no missed calls or messages found from cardiology office. Both pt and PTA attempted to call them and unable to get an answer from any of the options on main menu, it kept looping back to the recording. Pt left clinic and is going to go directly to cardiology office since unable to reach them. Primary PT not in office at this time. Spoke with Misty Stanley, PT, DPT on this matter who agreed with plan to hold PT and for pt to follow up with MD.        PT Short Term Goals - 09/28/18 1525      PT SHORT TERM GOAL #1   Title  Patient demonstrates proper adjustment of ply socks with limb volume changes. (All STGs Target Date 10/20/2018)    Time  4    Period  Weeks    Status  Revised    Target Date  10/20/18      PT SHORT TERM GOAL #2   Title  Patient tolerates prosthesis wear >12hrs total /day without skin issues.     Time  4     Period  Weeks    Status  Revised    Target Date  10/20/18      PT SHORT TERM GOAL #3   Title  Berg Balance >/= 40/56    Time  4    Period  Weeks    Status  New    Target Date  10/20/18      PT SHORT TERM GOAL #4   Title  Patient ambulates 300' with cane or  less & prosthesis with supervision.     Time  4    Period  Weeks    Status  Revised    Target Date  10/20/18      PT SHORT TERM GOAL #5   Title  Patient negotiates stairs with 2 rails, ramp & curb with cane or less & prosthesis with supervision.     Time  4    Period  Weeks    Status  Revised    Target Date  10/20/18        PT Long Term Goals - 09/14/18 2135      PT LONG TERM GOAL #1   Title  Patient verbalizes & demonstrates understanding of prosthetic care to enable safe use of prosthesis (All LTGs Target Date 11/15/2018)    Time  12    Period  Weeks    Status  On-going    Target Date  11/15/18      PT LONG TERM GOAL #2   Title  Patient tolerates prosthesis wear >90% of awake hours without skin or pain issues to enable function throughout her day.     Time  12    Period  Weeks    Status  On-going    Target Date  11/15/18      PT LONG TERM GOAL #3   Title  Berg Balance >/= 45/56 to indicate lower fall risk.     Time  12    Period  Weeks    Status  On-going    Target Date  11/15/18      PT LONG TERM GOAL #4   Title  Patient ambulates >500' outdoors with LRAD modified independent to enable safe function in community.     Time  12    Period  Weeks    Status  On-going    Target Date  11/15/18      PT LONG TERM GOAL #5   Title  Patient negotiates stairs, ramps & curbs with LRAD & Prosthesis modified independent to enable community access.     Time  12    Period  Weeks    Status  On-going    Target Date  11/15/18      PT LONG TERM GOAL #6   Title  Patient ambulates 5' around furniture with cane or less & prosthesis carrying household items modified independent for household function.     Time  12     Period  Weeks    Status  On-going    Target Date  11/15/18            Plan - 10/12/18 1237    Clinical Impression Statement  session cancelled today with pt to follow up with primary MD vs cardiology due to new onset of sudden shortness of breath and unknown massess in 3 areas of her LE's. Pt educated on signs of PE and reason for MD follow up to rule this out vs COPD exercerbation as she does report her LE's were swollen last night and now are not. Pt was going straight to cardiology office from here. Vitals stable here with pt only having shortness of breath on excertion.     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 prosthetic gait with cane for community /outdoors & no device indoors    Consulted and Agree with Plan of Care  Patient       Patient will benefit from skilled therapeutic intervention in order to improve the following deficits and impairments:  Abnormal gait, Decreased activity tolerance, Decreased balance,  Decreased endurance, Decreased knowledge of use of DME, Decreased mobility, Decreased range of motion, Decreased strength, Dizziness, Increased edema, Postural dysfunction, Prosthetic Dependency, Pain  Visit Diagnosis: Unsteadiness on feet     Problem List Patient Active Problem List   Diagnosis Date Noted  . Diarrhea 09/05/2018  . Urinary tract infection 06/30/2018  . ICH (intracerebral hemorrhage) ([place]) 06/27/2018  . DKA (diabetic ketoacidoses) ([place]) 06/26/2018  . V-tach ([place]) 05/14/2018  . Seizure ([place]) 05/14/2018  . Slow transit constipation   . Fall   . Labile blood glucose   . Labile blood pressure   . Chronic combined systolic and diastolic congestive heart failure ([place])   . Chronic diastolic congestive heart failure ([place])   . CKD (chronic kidney disease), stage III ([place])   . Type 2 diabetes mellitus with peripheral neuropathy (HCC)   . Amputation of right lower extremity below knee ([place]) 04/20/2018  . 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 Post-operative pain   . PAF (paroxysmal atrial fibrillation) ([place])   . Duodenal ulcer   . Atrial fibrillation with RVR ([place]) 04/14/2018  . Foot osteomyelitis, right ([place]) 04/14/2018  . Cutaneous abscess of left foot   . Acute respiratory failure with hypoxia ([place])   . Cellulitis 01/02/2018  . LGI bleed   . Acute blood loss anemia   . Wide-complex tachycardia ([place]) 07/04/2017  . Type II diabetes mellitus, uncontrolled ([place]) 07/04/2017  . Peripheral neuropathy 07/04/2017  . H/O hyperthyroidism 07/04/2017  . Atrial fibrillation with rapid ventricular response ([place]) 08/06/2016  . S/P transmetatarsal amputation of foot, right ([place]) 04/19/2016  . Obstructive sleep apnea 11/26/2015  . Bilateral carpal tunnel syndrome 11/26/2015  . Hypomagnesemia 11/16/2015  . Hyperlipidemia LDL goal <70 10/13/2015  . Abnormality of gait 09/02/2015  . Memory loss 08/12/2015  . Diabetic  peripheral neuropathy ([place]) 08/12/2015  . Vitamin D deficiency 08/12/2015  . Hyperthyroidism 04/30/2015  . Heme positive stool   . Persistent atrial fibrillation 04/29/2015  . Coronary artery disease with stable angina pectoris ([place]) 03/29/2015  . Abnormal nuclear stress test   . History of goiter 09/28/2014  . GERD (gastroesophageal reflux disease) 07/31/2013  . Depression with anxiety 06/01/2013  . DM (diabetes mellitus), type 2 with peripheral vascular complications ([place]) 16/02/9603  . HTN (hypertension) 01/30/2013    Willow Ora, PTA, [facility] [address] [place], [place] [address] [phone] 10/12/18, 12:44 PM   Name: Judith [name] MRN: [id number] Date of Birth: 1951-01-14

## 2018-10-12 NOTE — Telephone Encounter (Signed)
Pt c/o Shortness Of Breath: STAT if SOB developed within the last 24 hours or pt is noticeably SOB on the phone  1. Are you currently SOB (can you hear that pt is SOB on the phone)? no  2. How long have you been experiencing SOB?  Since about 5am.   3. Are you SOB when sitting or when up moving around? Up and moving (upon exertion)  4. Are you currently experiencing any other symptoms? No  Patient did note swelling in her leg before going to bed last night

## 2018-10-12 NOTE — Telephone Encounter (Signed)
lmtcb  (pt walked into office today and stated that someone told her to come to office.  Explained to front staff that we did not ask pt to come to office but to call office back.  They made pt appt tomorrow to further evaluate c/o SOB.  Have tried several times to reach pt today to discuss and determine if OV is needed tomorrow.

## 2018-10-13 ENCOUNTER — Encounter: Payer: Self-pay | Admitting: Cardiology

## 2018-10-13 ENCOUNTER — Encounter: Payer: Self-pay | Admitting: *Deleted

## 2018-10-13 ENCOUNTER — Ambulatory Visit (INDEPENDENT_AMBULATORY_CARE_PROVIDER_SITE_OTHER): Payer: Medicare PPO | Admitting: Cardiology

## 2018-10-13 VITALS — BP 144/80 | HR 60 | Ht 64.0 in | Wt 167.8 lb

## 2018-10-13 DIAGNOSIS — I5042 Chronic combined systolic (congestive) and diastolic (congestive) heart failure: Secondary | ICD-10-CM

## 2018-10-13 DIAGNOSIS — K921 Melena: Secondary | ICD-10-CM | POA: Diagnosis not present

## 2018-10-13 DIAGNOSIS — R0789 Other chest pain: Secondary | ICD-10-CM

## 2018-10-13 DIAGNOSIS — I350 Nonrheumatic aortic (valve) stenosis: Secondary | ICD-10-CM

## 2018-10-13 DIAGNOSIS — I480 Paroxysmal atrial fibrillation: Secondary | ICD-10-CM

## 2018-10-13 DIAGNOSIS — R0602 Shortness of breath: Secondary | ICD-10-CM

## 2018-10-13 DIAGNOSIS — I25118 Atherosclerotic heart disease of native coronary artery with other forms of angina pectoris: Secondary | ICD-10-CM

## 2018-10-13 DIAGNOSIS — I10 Essential (primary) hypertension: Secondary | ICD-10-CM

## 2018-10-13 DIAGNOSIS — E785 Hyperlipidemia, unspecified: Secondary | ICD-10-CM | POA: Diagnosis not present

## 2018-10-13 LAB — BASIC METABOLIC PANEL
BUN/Creatinine Ratio: 18 (ref 12–28)
BUN: 18 mg/dL (ref 8–27)
CO2: 22 mmol/L (ref 20–29)
Calcium: 9.9 mg/dL (ref 8.7–10.3)
Chloride: 102 mmol/L (ref 96–106)
Creatinine, Ser: 0.98 mg/dL (ref 0.57–1.00)
GFR calc Af Amer: 70 mL/min/{1.73_m2} (ref >59)
GFR calc non Af Amer: 60 mL/min/{1.73_m2} (ref >59)
Glucose: 98 mg/dL (ref 65–99)
Potassium: 4.3 mmol/L (ref 3.5–5.2)
Sodium: 137 mmol/L (ref 134–144)

## 2018-10-13 LAB — CBC
Hematocrit: 29.2 % — ABNORMAL LOW (ref 34.0–46.6)
Hemoglobin: 9.5 g/dL — ABNORMAL LOW (ref 11.1–15.9)
MCH: 28.6 pg (ref 26.6–33.0)
MCHC: 32.5 g/dL (ref 31.5–35.7)
MCV: 88 fL (ref 79–97)
Platelets: 387 10*3/uL (ref 150–450)
RBC: 3.32 x10E6/uL — ABNORMAL LOW (ref 3.77–5.28)
RDW: 13.3 % (ref 11.7–15.4)
WBC: 9.2 10*3/uL (ref 3.4–10.8)

## 2018-10-13 LAB — MAGNESIUM: Magnesium: 1.8 mg/dL (ref 1.6–2.3)

## 2018-10-13 LAB — PRO B NATRIURETIC PEPTIDE: NT-Pro BNP: 2908 pg/mL — ABNORMAL HIGH (ref 0–301)

## 2018-10-13 MED ORDER — HYDRALAZINE HCL 50 MG PO TABS: 50.0000 mg | ORAL_TABLET | Freq: Three times a day (TID) | ORAL | 3 refills | Status: AC

## 2018-10-13 MED ORDER — FUROSEMIDE 20 MG PO TABS: 20.0000 mg | ORAL_TABLET | Freq: Every day | ORAL | 3 refills | Status: DC

## 2018-10-13 MED ORDER — POTASSIUM CHLORIDE ER 10 MEQ PO TBCR: 10.0000 meq | EXTENDED_RELEASE_TABLET | Freq: Every day | ORAL | 3 refills | Status: DC

## 2018-10-13 NOTE — Patient Instructions (Addendum)
Medication Instructions:  Your physician has recommended you make the following change in your medication:  1.  START Lasix 20 mg taking 1 daily 2.  START Potassium 10 meq taking 1 daily  If you need a refill on your cardiac medications before your next appointment, please call your pharmacy.   Lab work: TODAY:  CBC, BMP, MAGNESIUM, & PRO BNP  If you have labs (blood work) drawn today and your tests are completely normal, you will receive your results only by:  MyChart Message (if you have MyChart) OR  A paper copy in the mail If you have any lab test that is abnormal or we need to change your treatment, we will call you to review the results.  Testing/Procedures: Your physician has requested that you have a lexiscan myoview. For further information please visit [email or web address]. Please follow instruction sheet, as given.    Follow-Up: At [facility], you and your health needs are our priority.  As part of our continuing mission to provide you with exceptional heart care, we have created designated Provider Care Teams.  These Care Teams include your primary Cardiologist (physician) and Advanced Practice Providers (APPs -  Physician Assistants and Nurse Practitioners) who all work together to provide you with the care you need, when you need it.  YOU ARE SCHEDULED FOR A VIRTUAL VISIT VIA VIDEO, 11/06/2018 WITH Cecilie Kicks, NP AT 10:00.  SEE INSTRUCTIONS BELOW:  YOUR CARDIOLOGY TEAM HAS ARRANGED FOR AN E-VISIT FOR YOUR APPOINTMENT - PLEASE REVIEW IMPORTANT INFORMATION BELOW SEVERAL DAYS PRIOR TO YOUR APPOINTMENT  Due to the recent COVID-19 pandemic, we are transitioning in-person office visits to tele-medicine visits in an effort to decrease unnecessary exposure to our patients, their families, and staff. These visits are billed to your insurance just like a normal visit is. We also encourage you to sign up for MyChart if you have not already done so. You will need a smartphone if  possible. For patients that do not have this, we can still complete the visit using a regular telephone but do prefer a smartphone to enable video when possible. You may have a family member that lives with you that can help. If possible, we also ask that you have a blood pressure cuff and scale at home to measure your blood pressure, heart rate and weight prior to your scheduled appointment. Patients with clinical needs that need an in-person evaluation and testing will still be able to come to the office if absolutely necessary. If you have any questions, feel free to call our office.     YOUR PROVIDER WILL BE USING THE FOLLOWING PLATFORM TO COMPLETE YOUR VISIT: Doximity   IF USING DOXIMITY or DOXY.ME - The staff will give you instructions on receiving your link to join the meeting the day of your visit.  Once you are finished giving your vital signs and going over your medications with the Assistant that calls you, she will let the provider know you are ready.  The provider will then send a link to you as a text message.  You will open the message, click join meeting, check in by typing in 1st and last name, if it asks permission to use video/mic, make sure you click allow.  Once you have done these steps, it will put you in a "virtual waiting room" then once the provider sees you in there, she will join the meeting.   2-3 DAYS BEFORE YOUR APPOINTMENT  You will receive a telephone call from one  of our [place] team members - your caller ID may say "Unknown caller." If this is a video visit, we will walk you through how to get the video launched on your phone. We will remind you check your blood pressure, heart rate and weight prior to your scheduled appointment. If you have an Apple Watch or Kardia, please upload any pertinent ECG strips the day before or morning of your appointment to [facility]. Our staff will also make sure you have reviewed the consent and agree to move forward with your  scheduled tele-health visit.     THE DAY OF YOUR APPOINTMENT  Approximately 15 minutes prior to your scheduled appointment, you will receive a telephone call from one of [place] team - your caller ID may say "Unknown caller."  Our staff will confirm medications, vital signs for the day and any symptoms you may be experiencing. Please have this information available prior to the time of visit start. It may also be helpful for you to have a pad of paper and pen handy for any instructions given during your visit. They will also walk you through joining the smartphone meeting if this is a video visit.    CONSENT FOR TELE-HEALTH VISIT - PLEASE REVIEW  I hereby voluntarily request, consent and authorize [facility] and its employed or contracted physicians, physician assistants, nurse practitioners or other licensed health care professionals (the Practitioner), to provide me with telemedicine health care services (the Services") as deemed necessary by the treating Practitioner.I acknowledge and consent to receive the Services by the Practitioner via telemedicine. I understand that the telemedicine visit will involve communicating with the Practitioner through live audiovisual communication technology and the disclosure of certain medical information by electronic transmission. I acknowledge that I have been given the opportunity to request an in-person assessment or other available alternative prior to the telemedicine visit and am voluntarily participating in the telemedicine visit.  I understand that I have the right to withhold or withdraw my consent to the use of telemedicine in the course of my care at any time, without affecting my right to future care or treatment, and that the Practitioner or I may terminate the telemedicine visit at any time. I understand that I have the right to inspect all information obtained and/or recorded in the course of the telemedicine visit and may receive  copies of available information for a reasonable fee.  I understand that some of thepotential risksof receiving the Services via telemedicine include:  Delay or interruption in medical evaluation due to technological equipment failure or disruption;  Information transmitted may not be sufficient (e.g. poor resolution of images) to allow for appropriate medical decision making by the Practitioner; and/or   In rare instances, security protocols could fail, causing a breach of personal health information.  Furthermore, I acknowledge that it is my responsibility to provide information about my medical history, conditions and care that is complete and accurate to the best of my ability. I acknowledge that Practitioner's advice, recommendations, and/or decision may be based on factors not within their control, such as incomplete or inaccurate data provided by me or distortions of diagnostic images or specimens that may result from electronic transmissions. I understand that the practice of medicine is not an exact science and that Practitioner makes no warranties or guarantees regarding treatment outcomes. I acknowledge that I will receive a copy of this consent concurrently upon execution via email to the email address I last provided but may also request a printed copy  by calling the office of [facility].    I understand that my insurance will be billed for this visit.   I have read or had this consent read to me.  I understand the contents of this consent, which adequately explains the benefits and risks of the Services being provided via telemedicine.   I have been provided ample opportunity to ask questions regarding this consent and the Services and have had my questions answered to my satisfaction.  I give my informed consent for the services to be provided through the use of telemedicine in my medical care  By participating in this telemedicine visit I agree to the  above.      Virtual Visit Pre-Appointment Phone Call  "(Name), I am calling you today to discuss your upcoming appointment. We are currently trying to limit exposure to the virus that causes COVID-19 by seeing patients at home rather than in the office."  1. "What is the BEST phone number to call the day of the visit?" - include this in appointment notes  2. Do you have or have access to (through a family member/friend) a smartphone with video capability that we can use for your visit?" a. If yes - list this number in appt notes as cell (if different from BEST phone #) and list the appointment type as a VIDEO visit in appointment notes b. If no - list the appointment type as a PHONE visit in appointment notes  3. Confirm consent - "In the setting of the current Covid19 crisis, you are scheduled for a (phone or video) visit with your provider on (date) at (time).  Just as we do with many in-office visits, in order for you to participate in this visit, we must obtain consent.  If you'd like, I can send this to your mychart (if signed up) or email for you to review.  Otherwise, I can obtain your verbal consent now.  All virtual visits are billed to your insurance company just like a normal visit would be.  By agreeing to a virtual visit, we'd like you to understand that the technology does not allow for your provider to perform an examination, and thus may limit your provider's ability to fully assess your condition. If your provider identifies any concerns that need to be evaluated in person, we will make arrangements to do so.  Finally, though the technology is pretty good, we cannot assure that it will always work on either your or our end, and in the setting of a video visit, we may have to convert it to a phone-only visit.  In either situation, we cannot ensure that we have a secure connection.  Are you willing to proceed?" STAFF: Did the patient verbally acknowledge consent to  telehealth visit? Document YES/NO here: YES  4. Advise patient to be prepared - "Two hours prior to your appointment, go ahead and check your blood pressure, pulse, oxygen saturation, and your weight (if you have the equipment to check those) and write them all down. When your visit starts, your provider will ask you for this information. If you have an Apple Watch or Kardia device, please plan to have heart rate information ready on the day of your appointment. Please have a pen and paper handy nearby the day of the visit as well."  5. Give patient instructions for MyChart download to smartphone OR Doximity/Doxy.me as below if video visit (depending on what platform provider is using)  6. Inform patient they will receive  a phone call 15 minutes prior to their appointment time (may be from unknown caller ID) so they should be prepared to answer    TELEPHONE CALL NOTE  Judith [name] has been deemed a candidate for a follow-up tele-health visit to limit community exposure during the Covid-19 pandemic. I spoke with the patient via phone to ensure availability of phone/video source, confirm preferred email & phone number, and discuss instructions and expectations.  I reminded Judith [name] to be prepared with any vital sign and/or heart rhythm information that could potentially be obtained via home monitoring, at the time of her visit. I reminded Judith [name] to expect a phone call prior to her visit.  Judith [name], RMA 10/13/2018 9:46 AM   IF USING DOXIMITY or DOXY.ME - The patient will receive a link just prior to their visit by text.    FULL LENGTH CONSENT FOR TELE-HEALTH VISIT   I hereby voluntarily request, consent and authorize [facility] and its employed or contracted physicians, physician assistants, nurse practitioners or other licensed health care professionals (the Practitioner), to provide me with telemedicine health care services (the Services") as deemed  necessary by the treating Practitioner.I acknowledge and consent to receive the Services by the Practitioner via telemedicine. I understand that the telemedicine visit will involve communicating with the Practitioner through live audiovisual communication technology and the disclosure of certain medical information by electronic transmission. I acknowledge that I have been given the opportunity to request an in-person assessment or other available alternative prior to the telemedicine visit and am voluntarily participating in the telemedicine visit.  I understand that I have the right to withhold or withdraw my consent to the use of telemedicine in the course of my care at any time, without affecting my right to future care or treatment, and that the Practitioner or I may terminate the telemedicine visit at any time. I understand that I have the right to inspect all information obtained and/or recorded in the course of the telemedicine visit and may receive copies of available information for a reasonable fee.  I understand that some of thepotential risksof receiving the Services via telemedicine include:  Delay or interruption in medical evaluation due to technological equipment failure or disruption;  Information transmitted may not be sufficient (e.g. poor resolution of images) to allow for appropriate medical decision making by the Practitioner; and/or   In rare instances, security protocols could fail, causing a breach of personal health information.  Furthermore, I acknowledge that it is my responsibility to provide information about my medical history, conditions and care that is complete and accurate to the best of my ability. I acknowledge that Practitioner's advice, recommendations, and/or decision may be based on factors not within their control, such as incomplete or inaccurate data provided by me or distortions of diagnostic images or specimens that may result from electronic  transmissions. I understand that the practice of medicine is not an exact science and that Practitioner makes no warranties or guarantees regarding treatment outcomes. I acknowledge that I will receive a copy of this consent concurrently upon execution via email to the email address I last provided but may also request a printed copy by calling the office of [place].    I understand that my insurance will be billed for this visit.   I have read or had this consent read to me.  I understand the contents of this consent, which adequately explains the benefits and risks of the Services being provided via telemedicine.  I have been provided ample opportunity to ask questions regarding this consent and the Services and have had my questions answered to my satisfaction.  I give my informed consent for the services to be provided through the use of telemedicine in my medical care  By participating in this telemedicine visit I agree to the above  Any Other Special Instructions Will Be Listed Below (If Applicable).  Cardiac Nuclear Scan A cardiac nuclear scan is a test that measures blood flow to the heart when a person is resting and when he or she is exercising. The test looks for problems such as:  Not enough blood reaching a portion of the heart.  The heart muscle not working normally. You may need this test if:  You have heart disease.  You have had abnormal lab results.  You have had heart surgery or a balloon procedure to open up blocked arteries (angioplasty).  You have chest pain.  You have shortness of breath. In this test, a radioactive dye (tracer) is injected into your bloodstream. After the tracer has traveled to your heart, an imaging device is used to measure how much of the tracer is absorbed by or distributed to various areas of your heart. This procedure is usually done at a hospital and takes 2-4 hours. Tell a health care provider about:  Any allergies you  have.  All medicines you are taking, including vitamins, herbs, eye drops, creams, and over-the-counter medicines.  Any problems you or family members have had with anesthetic medicines.  Any blood disorders you have.  Any surgeries you have had.  Any medical conditions you have.  Whether you are pregnant or may be pregnant. What are the risks? Generally, this is a safe procedure. However, problems may occur, including:  Serious chest pain and heart attack. This is only a risk if the stress portion of the test is done.  Rapid heartbeat.  Sensation of warmth in your chest. This usually passes quickly.  Allergic reaction to the tracer. What happens before the procedure?  Ask your health care provider about changing or stopping your regular medicines. This is especially important if you are taking diabetes medicines or blood thinners.  Follow instructions from your health care provider about eating or drinking restrictions.  Remove your jewelry on the day of the procedure. What happens during the procedure?  An IV will be inserted into one of your veins.  Your health care provider will inject a small amount of radioactive tracer through the IV.  You will wait for 20-40 minutes while the tracer travels through your bloodstream.  Your heart activity will be monitored with an electrocardiogram (ECG).  You will lie down on an exam table.  Images of your heart will be taken for about 15-20 minutes.  You may also have a stress test. For this test, one of the following may be done: ? You will exercise on a treadmill or stationary bike. While you exercise, your heart's activity will be monitored with an ECG, and your blood pressure will be checked. ? You will be given medicines that will increase blood flow to parts of your heart. This is done if you are unable to exercise.  When blood flow to your heart has peaked, a tracer will again be injected through the IV.  After 20-40  minutes, you will get back on the exam table and have more images taken of your heart.  Depending on the type of tracer used, scans may need to be  repeated 3-4 hours later.  Your IV line will be removed when the procedure is over. The procedure may vary among health care providers and hospitals. What happens after the procedure?  Unless your health care provider tells you otherwise, you may return to your normal schedule, including diet, activities, and medicines.  Unless your health care provider tells you otherwise, you may increase your fluid intake. This will help to flush the contrast dye from your body. Drink enough fluid to keep your urine pale yellow.  Ask your health care provider, or the department that is doing the test: ? When will my results be ready? ? How will I get my results? Summary  A cardiac nuclear scan measures the blood flow to the heart when a person is resting and when he or she is exercising.  Tell your health care provider if you are pregnant.  Before the procedure, ask your health care provider about changing or stopping your regular medicines. This is especially important if you are taking diabetes medicines or blood thinners.  After the procedure, unless your health care provider tells you otherwise, increase your fluid intake. This will help flush the contrast dye from your body.  After the procedure, unless your health care provider tells you otherwise, you may return to your normal schedule, including diet, activities, and medicines. This information is not intended to replace advice given to you by your health care provider. Make sure you discuss any questions you have with your health care provider. Document Released: 05/21/2004 Document Revised: 10/10/2017 Document Reviewed: 10/10/2017 Elsevier Interactive Patient Education  2019 [employer].

## 2018-10-16 ENCOUNTER — Telehealth (HOSPITAL_COMMUNITY): Payer: Self-pay | Admitting: *Deleted

## 2018-10-16 ENCOUNTER — Telehealth: Payer: Self-pay | Admitting: *Deleted

## 2018-10-16 DIAGNOSIS — G4733 Obstructive sleep apnea (adult) (pediatric): Secondary | ICD-10-CM | POA: Diagnosis not present

## 2018-10-16 NOTE — Telephone Encounter (Signed)
Patient given detailed instructions per Myocardial Perfusion Study Information Sheet for the test on 10/19/18. Patient notified to arrive 15 minutes early and that it is imperative to arrive on time for appointment to keep from having the test rescheduled.  If you need to cancel or reschedule your appointment, please call the office within 24 hours of your appointment. . Patient verbalized understanding. Judith [name]

## 2018-10-16 NOTE — Telephone Encounter (Signed)
-----   Message from Isaiah Serge, NP sent at 10/14/2018  9:45 PM EDT ----- Magnesium is good,  Lab does show fluid, is she better with edema on lasix?  If not increase to 40 mg for 2 days then back to 20 mg daily.  Her hgb is lower so she should call her PCP and tell her about blood in her stool and if we could send labs to PCP as well.  Kidney function is improved from last times.  Thanks.

## 2018-10-16 NOTE — Telephone Encounter (Signed)
Call placed to pt re: lab results, left a message for her to call back.  

## 2018-10-17 ENCOUNTER — Ambulatory Visit: Payer: Medicare PPO | Admitting: Physical Therapy

## 2018-10-18 ENCOUNTER — Encounter: Payer: Self-pay | Admitting: Family Medicine

## 2018-10-18 ENCOUNTER — Other Ambulatory Visit: Payer: Self-pay

## 2018-10-18 ENCOUNTER — Ambulatory Visit (INDEPENDENT_AMBULATORY_CARE_PROVIDER_SITE_OTHER): Payer: Medicare PPO | Admitting: Family Medicine

## 2018-10-18 VITALS — BP 128/84 | HR 67 | Temp 98.4°F | Ht 64.0 in | Wt 161.0 lb

## 2018-10-18 DIAGNOSIS — E1169 Type 2 diabetes mellitus with other specified complication: Secondary | ICD-10-CM | POA: Diagnosis not present

## 2018-10-18 DIAGNOSIS — S88111A Complete traumatic amputation at level between knee and ankle, right lower leg, initial encounter: Secondary | ICD-10-CM | POA: Diagnosis not present

## 2018-10-18 DIAGNOSIS — R2241 Localized swelling, mass and lump, right lower limb: Secondary | ICD-10-CM

## 2018-10-18 DIAGNOSIS — I5042 Chronic combined systolic (congestive) and diastolic (congestive) heart failure: Secondary | ICD-10-CM | POA: Diagnosis not present

## 2018-10-18 DIAGNOSIS — Z794 Long term (current) use of insulin: Secondary | ICD-10-CM | POA: Diagnosis not present

## 2018-10-18 DIAGNOSIS — D649 Anemia, unspecified: Secondary | ICD-10-CM | POA: Diagnosis not present

## 2018-10-18 DIAGNOSIS — I482 Chronic atrial fibrillation, unspecified: Secondary | ICD-10-CM

## 2018-10-18 LAB — LIPID PANEL

## 2018-10-18 MED ORDER — FERROUS GLUCONATE 324 (38 FE) MG PO TABS: 324.0000 mg | ORAL_TABLET | Freq: Every day | ORAL | 0 refills | Status: DC

## 2018-10-18 MED ORDER — HYDROCORTISONE ACETATE 25 MG RE SUPP: 25.0000 mg | Freq: Two times a day (BID) | RECTAL | 1 refills | Status: DC

## 2018-10-18 NOTE — Progress Notes (Signed)
6/10/202010:48 AM  Judith [name] March 30, 1951, 68 y.o., female [id number]  Chief Complaint  Patient presents with  . Mass    feels lumps on both thighs, itch at noght and ache    HPI:   Patient is a 68 y.o. female with past medical history significant for DM2,afib,CAD,peripheral neuropathy, and right foot amputation,hypomagenesemia, anemia from chronic blood loss, L thalamic ICH, OSA on bipap who presents today for routine folllowup   Patient Care Team: Rutherford Guys, MD as PCP - General (Family Medicine) Thompson Grayer, MD as PCP - Electrophysiology (Cardiology) Sueanne Margarita, MD as PCP - Cardiology (Cardiology) Iran Planas, MD as Consulting Physician (Orthopedic Surgery) Marygrace Drought, MD as Consulting Physician (Ophthalmology) Renato Shin, MD as Consulting Physician (Endocrinology) Sueanne Margarita, MD as Consulting Physician (Cardiology)    Last OV March 2020 Saw cards a week ago for SOB - added lasix 20mg  and Kdur 53meq, ordered lexiscan with myoview, checked labs  Lab Results  Component Value Date   CREATININE 0.98 10/13/2018   BUN 18 10/13/2018   NA 137 10/13/2018   K 4.3 10/13/2018   CL 102 10/13/2018   CO2 22 10/13/2018   Mg 1.8 BNP 2908  Edema has resolved Patient reports she is doing well Requesting labs to be drawn for endo as has telemedicine visit soon She is also requesting [place] for lumps on right thigh, tender, bother her when she sleeps on her side, PT has stopped until lumps dx to be benign  CBC Latest Ref Rng & Units 10/13/2018 07/07/2018 06/08/2018  WBC 3.4 - 10.8 x10E3/uL 9.2 8.3 5.9  Hemoglobin 11.1 - 15.9 g/dL 9.5(L) 10.5(L) 11.3(L)  Hematocrit 34.0 - 46.6 % 29.2(L) 31.0(L) 36.0  Platelets 150 - 450 x10E3/uL 387 490(H) 445(H)   H/o bleeding ulcers, - EGD in 2019 Had to change PPI to pepcid due to persistent hypomagnesium Has also bleeding hemorrhoid Not eating much meat due to dental issues but will be getting new dentures  Lab  Results  Component Value Date   HGBA1C 7.5 (A) 06/07/2018  had DM eye exam past couple of months ago  Has to schedule her gyn appt  Fall Risk  10/18/2018 09/05/2018 07/07/2018 06/16/2018 05/25/2018  Falls in the past year? 0 0 0 0 0  Number falls in past yr: 0 - 0 0 -  Injury with Fall? 0 0 0 0 -  Comment - - - - -  Risk Factor Category  - - - - -  Risk for fall due to : - - - - -  Risk for fall due to: Comment - - - - -  Follow up - - - - -     Depression screen [facility] 2/9 10/18/2018 09/05/2018 07/07/2018  Decreased Interest 0 0 0  Down, Depressed, Hopeless 0 0 0  PHQ - 2 Score 0 0 0  Some recent data might be hidden    Allergies  Allergen Reactions  . Contrast Media [Iodinated Diagnostic Agents] Hives and Other (See Comments)    Spoke to patient, Iodine allergy is really IV contrast allergy.   [name] [Insulin Aspart] Shortness Of Breath and Other (See Comments)    "breathing problems"  . Codeine Nausea And Vomiting and Other (See Comments)    HIGH DOSES-SEVERE VOMITING  . Iodine Other (See Comments)    MUST HAVE BENADRYL PRIOR TO PROCEDURE AND RIGHT BEFORE TREATMENT TO COUNTERACT REACTION-BLISTERING REACTION DERMATOLOGICAL  . Penicillins Itching, Rash and Other (See Comments)  Has patient had a PCN reaction causing immediate rash, facial/tongue/throat swelling, SOB or lightheadedness with hypotension: no Has patient had a PCN reaction causing severe rash involving mucus membranes or skin necrosis: No Has patient had a PCN reaction that required hospitalization No Has patient had a PCN reaction occurring within the last 10 years: No If all of the above answers are "NO", then may proceed with Cephalosporin use.  CHEST SIZED RASH AND ITCHING   . Ace Inhibitors Cough  . Demerol [Meperidine] Nausea And Vomiting  . Dilaudid [Hydromorphone Hcl] Other (See Comments)    HEADACHE   . Neosporin [Neomycin-Bacitracin Zn-Polymyx] Itching, Rash and Other (See Comments)    MAKES REACTIONS  WORSE WHEN USING AS PROPHYLACTIC  . Percocet [Oxycodone-Acetaminophen] Rash  . Tape Itching and Rash    Prior to Admission medications   Medication Sig Start Date End Date Taking? Authorizing Provider  acetaminophen (TYLENOL) 325 MG tablet Take 1-2 tablets (325-650 mg total) by mouth every 6 (six) hours as needed for mild pain (pain score 1-3 or temp > 100.5). 05/01/18  Yes Angiulli, Lavon Paganini, PA-C  albuterol (PROVENTIL HFA) 108 (90 Base) MCG/ACT inhaler Inhale 1-2 puffs into the lungs every 6 (six) hours as needed for wheezing or shortness of breath. 05/01/18  Yes Angiulli, Lavon Paganini, PA-C  amiodarone (PACERONE) 200 MG tablet Take 1 tablet (200 mg total) by mouth daily. 06/08/18  Yes Sherran Needs, NP  apixaban (ELIQUIS) 5 MG TABS tablet Take 1 tablet (5 mg total) by mouth 2 (two) times daily. 05/01/18  Yes Angiulli, Lavon Paganini, PA-C  atorvastatin (LIPITOR) 20 MG tablet Take 1 tablet (20 mg total) by mouth daily. 05/01/18  Yes Angiulli, Lavon Paganini, PA-C  buPROPion [facility] SR) 150 MG 12 hr tablet Take 1 tablet (150 mg total) by mouth 2 (two) times daily. 05/01/18  Yes Angiulli, Lavon Paganini, PA-C  diltiazem (CARDIZEM CD) 120 MG 24 hr capsule Take 1 capsule (120 mg total) by mouth daily. 07/07/18  Yes Rutherford Guys, MD  famotidine (PEPCID) 20 MG tablet Take 1 tablet (20 mg total) by mouth 2 (two) times daily. 06/17/18  Yes Rutherford Guys, MD  fenofibrate 160 MG tablet Take 1 tablet (160 mg total) by mouth daily. 05/02/18  Yes Angiulli, Lavon Paganini, PA-C  FLUoxetine (PROZAC) 20 MG capsule TAKE 1 CAPSULE AT BEDTIME 09/14/18  Yes Rutherford Guys, MD  furosemide (LASIX) 20 MG tablet Take 1 tablet (20 mg total) by mouth daily. 10/13/18 01/11/19 Yes Isaiah Serge, NP  hydrALAZINE (APRESOLINE) 50 MG tablet Take 1 tablet (50 mg total) by mouth 3 (three) times daily. 10/13/18  Yes Isaiah Serge, NP  magnesium oxide (MAG-OX) 400 (241.3 Mg) MG tablet TAKE 1 TABLET EVERY DAY 09/19/18  Yes Rutherford Guys, MD   metFORMIN (GLUCOPHAGE) 1000 MG tablet Take 1 tablet (1,000 mg total) by mouth daily with breakfast. 06/07/18  Yes Renato Shin, MD  methimazole (TAPAZOLE) 10 MG tablet TAKE 1 TABLET (10 MG TOTAL) BY MOUTH DAILY. 09/25/18  Yes Renato Shin, MD  metoprolol succinate (TOPROL-XL) 25 MG 24 hr tablet Take 25 mg by mouth 2 (two) times daily.   Yes [provider]  mupirocin ointment (BACTROBAN) 2 % Apply 1 application topically 3 (three) times daily. 06/16/18  Yes Rutherford Guys, MD  potassium chloride (K-DUR) 10 MEQ tablet Take 1 tablet (10 mEq total) by mouth daily. 10/13/18 01/11/19 Yes Isaiah Serge, NP  traMADol (ULTRAM) 50 MG tablet Take 50  mg by mouth every 6 (six) hours as needed.   Yes [provider]  TRESIBA FLEXTOUCH 100 UNIT/ML SOPN FlexTouch Pen INJECT 20 UNITS SUBCUTANEOUSLY AT BEDTIME 08/10/18  Yes Renato Shin, MD  TRUE METRIX BLOOD GLUCOSE TEST test strip TEST BLOOD SUGAR TWICE DAILY 09/16/18  Yes Renato Shin, MD    Past Medical History:  Diagnosis Date  . Abnormal EKG 07/31/2013  . Arthritis    "hands" (03/06/2015)  . Asthma   . Carpal tunnel syndrome, bilateral   . Chronic kidney disease   . Complication of anesthesia    slow to wake up. Pt sts she woke up during surgery many years ago.  . Coronary artery disease     2 v CAD with CTO of the RCA and high grade bifurcational LCx/OM stenosis. S/P PCI DES x 2 to the LCx/OM.  Marland Kitchen Diabetic peripheral neuropathy ([place]) "since 1996"  . GERD (gastroesophageal reflux disease)   . Goiter   . Headache    migraines prior to menopause  . History of shingles 06/01/2013  . Hyperlipidemia LDL goal <70 10/13/2015  . Hypertension   . Hyperthyroidism   . Osteomyelitis of foot (HCC)    Right  . PAF (paroxysmal atrial fibrillation) ([place]) 04/29/2015   CHADS2VASC score of 5 now on Apixaban  . Pneumonia ~ 1976  . Sleep apnea    Bipap  . Tremors of nervous system   . Type II diabetes mellitus (HCC)    insulin dependent    Past  Surgical History:  Procedure Laterality Date  . ABDOMINAL HYSTERECTOMY  1988   age 31; CERVICAL DYSPLASIA; ovaries intact.   . AMPUTATION Right 01/23/2016   Procedure: Right 3rd Ray Amputation;  Surgeon: [name], MD;  Location: [place];  Service: Orthopedics;  Laterality: Right;  . AMPUTATION Right 02/13/2016   Procedure: Right Transmetatarsal Amputation;  Surgeon: [name], MD;  Location: [place];  Service: Orthopedics;  Laterality: Right;  . AMPUTATION Right 04/15/2018   Procedure: AMPUTATION BELOW KNEE;  Surgeon: [name], MD;  Location: [place];  Service: Orthopedics;  Laterality: Right;  . BIOPSY  04/16/2018   Procedure: BIOPSY;  Surgeon: Irene Shipper, MD;  Location: [facility];  Service: Endoscopy;;  . CARDIAC CATHETERIZATION N/A 02/27/2015   Procedure: Left Heart Cath and Coronary Angiography;  Surgeon: [name], MD; LAD 40%, mCFX 80%, OM 70%, RCA 100% calcified       . CARDIAC CATHETERIZATION N/A 03/06/2015   Procedure: Coronary Stent Intervention;  Surgeon: [name], MD;  Location: [place] CV [facility];  Service: Cardiovascular;  Laterality: N/A;  Mid CX 3.50x12 promus DES w/ 0% resdual and Prox OM1 2.50x20 promus DES w/ 20% residual  . CARDIOVERSION    . CARDIOVERSION N/A 05/22/2018   Procedure: CARDIOVERSION;  Surgeon: Skeet Latch, MD;  Location: [place];  Service: Cardiovascular;  Laterality: N/A;  . CARPAL TUNNEL RELEASE Right Nov 2015  . CARPAL TUNNEL RELEASE Right 1992; 05/2014   [name]; [place]  . CESAREAN SECTION  1982; 1984  . ESOPHAGOGASTRODUODENOSCOPY (EGD) WITH PROPOFOL N/A 04/16/2018   Procedure: ESOPHAGOGASTRODUODENOSCOPY (EGD) WITH PROPOFOL;  Surgeon: Irene Shipper, MD;  Location: [facility] ENDOSCOPY;  Service: Endoscopy;  Laterality: N/A;  . FOOT NEUROMA SURGERY Bilateral 2000  . I&D EXTREMITY Left 01/06/2018   Procedure: DEBRIDEMENT ULCER LEFT FOOT;  Surgeon: [name], MD;  Location: [place];  Service: Orthopedics;  Laterality: Left;   . KNEE ARTHROSCOPY Right ~ 2003   "  meniscus repair"  . SHOULDER OPEN ROTATOR CUFF REPAIR Right 1996; 1998   "w/fracture repair"  . THYROID SURGERY  2000   "removed lots of nodules"  . TONSILLECTOMY  1976    Social History   Tobacco Use  . Smoking status: Former Smoker    Packs/day: 0.00    Years: 41.00    Pack years: 0.00    Types: Cigarettes    Last attempt to quit: 03/05/2015    Years since quitting: 3.6  . Smokeless tobacco: Never Used  . Tobacco comment: 04/29/2015 "quit smoking cigarettes 02/27/2015"  Substance Use Topics  . Alcohol use: No    Family History  Problem Relation Age of Onset  . Cancer Mother 75       bronchial cancer  . Breast cancer Mother   . Lung cancer Mother   . Hypertension Father   . COPD Father   . Heart disease Father 60       CAD with cardiac stenting  . Heart attack Father   . Parkinson's disease Father   . Allergies Sister   . Breast cancer Maternal Grandmother   . Emphysema Maternal Grandfather   . Leukemia Paternal Grandmother   . Emphysema Paternal Grandfather   . Thyroid disease Neg Hx     Review of Systems  Constitutional: Negative for chills, fever and malaise/fatigue.  Respiratory: Negative for cough and shortness of breath.   Cardiovascular: Negative for chest pain, palpitations, orthopnea, leg swelling and PND.  Gastrointestinal: Positive for blood in stool and heartburn. Negative for abdominal pain, constipation, diarrhea, nausea and vomiting.  Psychiatric/Behavioral: Negative for depression (well controlled on medication, very happy that she is walking with prosthesis and cane).   Per hpi  OBJECTIVE:  Today's Vitals   10/18/18 1045  BP: 128/84  Pulse: 67  Temp: 98.4 F (36.9 C)  TempSrc: Oral  SpO2: 97%  Weight: 161 lb (73 kg)  Height: 5\' 4"  (1.626 m)   Body mass index is 27.64 kg/m.   Physical Exam Vitals signs and nursing note reviewed.  Constitutional:      Appearance: She is well-developed.   HENT:     Head: Normocephalic and atraumatic.     Mouth/Throat:     Pharynx: No oropharyngeal exudate.  Eyes:     General: No scleral icterus.    Conjunctiva/sclera: Conjunctivae normal.     Pupils: Pupils are equal, round, and reactive to light.  Neck:     Musculoskeletal: Neck supple.  Cardiovascular:     Rate and Rhythm: Normal rate and regular rhythm.     Heart sounds: Normal heart sounds. No murmur. No friction rub. No gallop.   Pulmonary:     Effort: Pulmonary effort is normal.     Breath sounds: Normal breath sounds. No wheezing or rales.  Musculoskeletal:     Right lower leg: No edema (stump well healed).     Left lower leg: No edema.     Comments: Right medial thigh several 1cm firm mobile nodules that seem to be superficial  Skin:    General: Skin is warm and dry.  Neurological:     Mental Status: She is alert and oriented to person, place, and time.     ASSESSMENT and PLAN  1. Anemia, unspecified type Normocystic. Iron studies, however having BRRP and known recent ulcers. Referring back to GI, stating presumptive iron supplementation and anusol for int hemorrhoid. On eliquis. - Iron, TIBC and Ferritin Panel - Ambulatory referral to Gastroenterology  2. Leg mass, right Appear benign. [place] to characterize. Consider derm/gen surg referral for removal as they are painful at times.  - [place] RT LOWER EXTREM LTD SOFT TISSUE NON VASCULAR; Future  3. Type 2 diabetes mellitus with other specified complication, with long-term current use of insulin (HCC) Managed by endo, labs cc to them - Hemoglobin A1c - Lipid panel - TSH  4. Chronic atrial fibrillation On eliquis, managed by cards  5. Hypomagnesemia Stable. Cont current meds  6. Chronic combined systolic and diastolic congestive heart failure ([place]) Responding well to lasix, managed by cards, fu as scheduled  7. Amputation of right lower extremity below knee [facility]) Doing well with prosthesis and cane, ok to resume PT   Other orders - hydrocortisone (ANUSOL-HC) 25 MG suppository; Place 1 suppository (25 mg total) rectally 2 (two) times daily. - ferrous gluconate (FERGON) 324 MG tablet; Take 1 tablet (324 mg total) by mouth daily with breakfast.  Return in about 3 months (around 01/18/2019).    Rutherford Guys, MD Primary [facility] at [facility], [place] [address] Ph.  [phone] Fax [phone]

## 2018-10-18 NOTE — Patient Instructions (Signed)
° ° ° °  If you have lab work done today you will be contacted with your lab results within the next 2 weeks.  If you have not heard from us then please contact us. The fastest way to get your results is to register for My Chart. ° ° °IF you received an x-ray today, you will receive an invoice from [facility]. Please contact [facility] at [phone] with questions or concerns regarding your invoice.  ° °IF you received labwork today, you will receive an invoice from [facility]. Please contact [facility] at [phone] with questions or concerns regarding your invoice.  ° °Our billing staff will not be able to assist you with questions regarding bills from these companies. ° °You will be contacted with the lab results as soon as they are available. The fastest way to get your results is to activate your My Chart account. Instructions are located on the last page of this paperwork. If you have not heard from us regarding the results in 2 weeks, please contact this office. °  ° ° ° °

## 2018-10-19 ENCOUNTER — Ambulatory Visit (HOSPITAL_COMMUNITY): Payer: Medicare PPO | Attending: Internal Medicine

## 2018-10-19 ENCOUNTER — Ambulatory Visit: Payer: Medicare PPO | Admitting: Physical Therapy

## 2018-10-19 DIAGNOSIS — R0789 Other chest pain: Secondary | ICD-10-CM | POA: Diagnosis not present

## 2018-10-19 LAB — IRON,TIBC AND FERRITIN PANEL
Ferritin: 116 ng/mL (ref 15–150)
Iron Saturation: 21 % (ref 15–55)
Iron: 64 ug/dL (ref 27–139)
Total Iron Binding Capacity: 298 ug/dL (ref 250–450)
UIBC: 234 ug/dL (ref 118–369)

## 2018-10-19 LAB — MYOCARDIAL PERFUSION IMAGING
LV dias vol: 127 mL (ref 46–106)
LV sys vol: 54 mL
Peak HR: 63 {beats}/min
Rest HR: 55 {beats}/min
SDS: 0
SRS: 2
SSS: 2
TID: 0.98

## 2018-10-19 LAB — TSH: TSH: 0.474 u[IU]/mL (ref 0.450–4.500)

## 2018-10-19 LAB — LIPID PANEL
Chol/HDL Ratio: 3.1 ratio (ref 0.0–4.4)
Cholesterol, Total: 118 mg/dL (ref 100–199)
HDL: 38 mg/dL — ABNORMAL LOW (ref >39)
LDL Calculated: 50 mg/dL (ref 0–99)
Triglycerides: 152 mg/dL — ABNORMAL HIGH (ref 0–149)
VLDL Cholesterol Cal: 30 mg/dL (ref 5–40)

## 2018-10-19 LAB — HEMOGLOBIN A1C
Est. average glucose Bld gHb Est-mCnc: 143 mg/dL
Hgb A1c MFr Bld: 6.6 % — ABNORMAL HIGH (ref 4.8–5.6)

## 2018-10-19 MED ORDER — TECHNETIUM TC 99M TETROFOSMIN IV KIT
10.0000 | PACK | Freq: Once | INTRAVENOUS | Status: AC | PRN
  Administered 2018-10-19: 10 via INTRAVENOUS
  Filled 2018-10-19: qty 10

## 2018-10-19 MED ORDER — TECHNETIUM TC 99M TETROFOSMIN IV KIT
31.5000 | PACK | Freq: Once | INTRAVENOUS | Status: AC | PRN
  Administered 2018-10-19: 31.5 via INTRAVENOUS
  Filled 2018-10-19: qty 32

## 2018-10-19 MED ORDER — REGADENOSON 0.4 MG/5ML IV SOLN
0.4000 mg | Freq: Once | INTRAVENOUS | Status: AC
  Administered 2018-10-19: 0.4 mg via INTRAVENOUS

## 2018-10-20 ENCOUNTER — Other Ambulatory Visit: Payer: Self-pay

## 2018-10-20 ENCOUNTER — Ambulatory Visit (INDEPENDENT_AMBULATORY_CARE_PROVIDER_SITE_OTHER): Payer: Medicare PPO | Admitting: Endocrinology

## 2018-10-20 DIAGNOSIS — E0590 Thyrotoxicosis, unspecified without thyrotoxic crisis or storm: Secondary | ICD-10-CM

## 2018-10-20 DIAGNOSIS — E1142 Type 2 diabetes mellitus with diabetic polyneuropathy: Secondary | ICD-10-CM

## 2018-10-20 MED ORDER — TRESIBA FLEXTOUCH 100 UNIT/ML ~~LOC~~ SOPN: 16.0000 [IU] | PEN_INJECTOR | Freq: Every day | SUBCUTANEOUS | 3 refills | Status: DC

## 2018-10-20 NOTE — Patient Instructions (Addendum)
Please reduce the insulin to 16 units daily. Please continue the same methimazole check your blood sugar twice a day.  vary the time of day when you check, between before the 3 meals, and at bedtime.  also check if you have symptoms of your blood sugar being too high or too low.  please keep a record of the readings and bring it to your next appointment here (or you can bring the meter itself).  You can write it on any piece of paper.  please call us sooner if your blood sugar goes below 70, or if you have a lot of readings over 200. Please come back for a follow-up appointment in 3 months.

## 2018-10-20 NOTE — Progress Notes (Signed)
Subjective:    Patient ID: Judith [name], female    DOB: 01/19/1951, 68 y.o.   MRN: [id number]  HPI telehealth visit today via phone x 5 minutes.  Alternatives to telehealth are presented to this patient, and the patient agrees to the telehealth visit.   Pt is advised of the cost of the visit, and agrees to this, also.   Patient is at home, and I am at the office.   Pt returns for f/u of hyperthyroidism (pt says she took synthroid for a brief time in the 1990's, in an attempt to shrink a goiter; slightly suppressed TSH was first noted in late 2016, when she was in the hospital for new-onset AF; she converted back to SR; nuc med scan showed heterogeneous uptake; neck scar is from C-spine procedure). she takes tapazole as rx'ed.  pt states she feels well in general.   Pt also ret for f/u of DM: DM type: Insulin-requiring type 2. Dx'ed: 1751 Complications: polyneuropathy, renal failure, retinopathy, right BKA, and CAD.   Therapy: insulin since 1997, and metformin.   GDM: never.  DKA: never. Severe hypoglycemia: never.   Pancreatitis: never.   Other: she said reg and novolog both caused sob, so she was changed to qd insulin.   Interval history: pt states she feels well in general.  She says cbg varies from 98-144.  She says she never misses the insulin. Past Medical History:  Diagnosis Date  . Abnormal EKG 07/31/2013  . Arthritis    "hands" (03/06/2015)  . Asthma   . Carpal tunnel syndrome, bilateral   . Chronic kidney disease   . Complication of anesthesia    slow to wake up. Pt sts she woke up during surgery many years ago.  . Coronary artery disease     2 v CAD with CTO of the RCA and high grade bifurcational LCx/OM stenosis. S/P PCI DES x 2 to the LCx/OM.  Marland Kitchen Diabetic peripheral neuropathy ([place]) "since 1996"  . GERD (gastroesophageal reflux disease)   . Goiter   . Headache    migraines prior to menopause  . History of shingles 06/01/2013  . Hyperlipidemia LDL goal <70 10/13/2015   . Hypertension   . Hyperthyroidism   . Osteomyelitis of foot (HCC)    Right  . PAF (paroxysmal atrial fibrillation) ([place]) 04/29/2015   CHADS2VASC score of 5 now on Apixaban  . Pneumonia ~ 1976  . Sleep apnea    Bipap  . Tremors of nervous system   . Type II diabetes mellitus (HCC)    insulin dependent    Past Surgical History:  Procedure Laterality Date  . ABDOMINAL HYSTERECTOMY  1988   age 64; CERVICAL DYSPLASIA; ovaries intact.   . AMPUTATION Right 01/23/2016   Procedure: Right 3rd Ray Amputation;  Surgeon: [name], MD;  Location: [facility];  Service: Orthopedics;  Laterality: Right;  . AMPUTATION Right 02/13/2016   Procedure: Right Transmetatarsal Amputation;  Surgeon: [name], MD;  Location: [place];  Service: Orthopedics;  Laterality: Right;  . AMPUTATION Right 04/15/2018   Procedure: AMPUTATION BELOW KNEE;  Surgeon: [name], MD;  Location: [place];  Service: Orthopedics;  Laterality: Right;  . BIOPSY  04/16/2018   Procedure: BIOPSY;  Surgeon: Irene Shipper, MD;  Location: [facility] ENDOSCOPY;  Service: Endoscopy;;  . CARDIAC CATHETERIZATION N/A 02/27/2015   Procedure: Left Heart Cath and Coronary Angiography;  Surgeon: [name], MD; LAD 40%, mCFX 80%, OM 70%, RCA 100% calcified       .  CARDIAC CATHETERIZATION N/A 03/06/2015   Procedure: Coronary Stent Intervention;  Surgeon: [name], MD;  Location: [facility] [facility];  Service: Cardiovascular;  Laterality: N/A;  Mid CX 3.50x12 promus DES w/ 0% resdual and Prox OM1 2.50x20 promus DES w/ 20% residual  . CARDIOVERSION    . CARDIOVERSION N/A 05/22/2018   Procedure: CARDIOVERSION;  Surgeon: Skeet Latch, MD;  Location: [place];  Service: Cardiovascular;  Laterality: N/A;  . CARPAL TUNNEL RELEASE Right Nov 2015  . CARPAL TUNNEL RELEASE Right 1992; 05/2014   [name]; [place]  . CESAREAN SECTION  1982; 1984  . ESOPHAGOGASTRODUODENOSCOPY (EGD) WITH PROPOFOL N/A 04/16/2018   Procedure:  ESOPHAGOGASTRODUODENOSCOPY (EGD) WITH PROPOFOL;  Surgeon: Irene Shipper, MD;  Location: [facility] ENDOSCOPY;  Service: Endoscopy;  Laterality: N/A;  . FOOT NEUROMA SURGERY Bilateral 2000  . I&D EXTREMITY Left 01/06/2018   Procedure: DEBRIDEMENT ULCER LEFT FOOT;  Surgeon: [name], MD;  Location: [place];  Service: Orthopedics;  Laterality: Left;  . KNEE ARTHROSCOPY Right ~ 2003   "meniscus repair"  . SHOULDER OPEN ROTATOR CUFF REPAIR Right 1996; 1998   "w/fracture repair"  . THYROID SURGERY  2000   "removed lots of nodules"  . TONSILLECTOMY  1976    Social History   Socioeconomic History  . Marital status: Single    Spouse name: Not on file  . Number of children: 2  . Years of education: Masters  . Highest education level: Not on file  Occupational History  . Occupation: Landscape architect  Social Needs  . Financial resource strain: Somewhat hard  . Food insecurity    Worry: Sometimes true    Inability: Sometimes true  . Transportation needs    Medical: No    Non-medical: No  Tobacco Use  . Smoking status: Former Smoker    Packs/day: 0.00    Years: 41.00    Pack years: 0.00    Types: Cigarettes    Quit date: 03/05/2015    Years since quitting: 3.6  . Smokeless tobacco: Never Used  . Tobacco comment: 04/29/2015 "quit smoking cigarettes 02/27/2015"  Substance and Sexual Activity  . Alcohol use: No  . Drug use: No  . Sexual activity: Not Currently    Birth control/protection: Post-menopausal, Surgical  Lifestyle  . Physical activity    Days per week: 0 days    Minutes per session: 0 min  . Stress: Very much  Relationships  . Social connections    Talks on phone: More than three times a week    Gets together: More than three times a week    Attends religious service: More than 4 times per year    Active member of club or organization: No    Attends meetings of clubs or organizations: Never    Relationship status: Never married  . Intimate partner violence    Fear  of current or ex partner: No    Emotionally abused: No    Physically abused: No    Forced sexual activity: No  Other Topics Concern  . 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      Lives with sister in [place].      Tobacco: 1 ppd x 41 years - quit 2016  Alcohol: none      Drugs: none      Exercise:  Walking in neighborhood; physical job.   Right-handed.   2 cups caffeine daily.    Current Outpatient Medications on File Prior to Visit  Medication Sig Dispense Refill  . acetaminophen (TYLENOL) 325 MG tablet Take 1-2 tablets (325-650 mg total) by mouth every 6 (six) hours as needed for mild pain (pain score 1-3 or temp > 100.5).    Marland Kitchen albuterol (PROVENTIL HFA) 108 (90 Base) MCG/ACT inhaler Inhale 1-2 puffs into the lungs every 6 (six) hours as needed for wheezing or shortness of breath. 1 Inhaler 0  . amiodarone (PACERONE) 200 MG tablet Take 1 tablet (200 mg total) by mouth daily. 30 tablet 6  . apixaban (ELIQUIS) 5 MG TABS tablet Take 1 tablet (5 mg total) by mouth 2 (two) times daily. 180 tablet 3  . atorvastatin (LIPITOR) 20 MG tablet Take 1 tablet (20 mg total) by mouth daily. 90 tablet 3  . buPROPion (WELLBUTRIN SR) 150 MG 12 hr tablet Take 1 tablet (150 mg total) by mouth 2 (two) times daily. 60 tablet 1  . diltiazem (CARDIZEM CD) 120 MG 24 hr capsule Take 1 capsule (120 mg total) by mouth daily. 90 capsule 1  . famotidine (PEPCID) 20 MG tablet Take 1 tablet (20 mg total) by mouth 2 (two) times daily. 60 tablet 3  . fenofibrate 160 MG tablet Take 1 tablet (160 mg total) by mouth daily. 30 tablet 1  . ferrous gluconate (FERGON) 324 MG tablet Take 1 tablet (324 mg total) by mouth daily with breakfast. 90 tablet 0  . FLUoxetine (PROZAC) 20 MG capsule TAKE  1 CAPSULE AT BEDTIME 90 capsule 1  . furosemide (LASIX) 20 MG tablet Take 1 tablet (20 mg total) by mouth daily. 90 tablet 3  . hydrALAZINE (APRESOLINE) 50 MG tablet Take 1 tablet (50 mg total) by mouth 3 (three) times daily. 270 tablet 3  . hydrocortisone (ANUSOL-HC) 25 MG suppository Place 1 suppository (25 mg total) rectally 2 (two) times daily. 12 suppository 1  . magnesium oxide (MAG-OX) 400 (241.3 Mg) MG tablet TAKE 1 TABLET EVERY DAY 90 tablet 0  . metFORMIN (GLUCOPHAGE) 1000 MG tablet Take 1 tablet (1,000 mg total) by mouth daily with breakfast. 30 tablet 11  . methimazole (TAPAZOLE) 10 MG tablet TAKE 1 TABLET (10 MG TOTAL) BY MOUTH DAILY. 90 tablet 0  . metoprolol succinate (TOPROL-XL) 25 MG 24 hr tablet Take 25 mg by mouth 2 (two) times daily.    . mupirocin ointment (BACTROBAN) 2 % Apply 1 application topically 3 (three) times daily. 22 g 1  . potassium chloride (K-DUR) 10 MEQ tablet Take 1 tablet (10 mEq total) by mouth daily. 90 tablet 3  . traMADol (ULTRAM) 50 MG tablet Take 50 mg by mouth every 6 (six) hours as needed.    . TRUE METRIX BLOOD GLUCOSE TEST test strip TEST BLOOD SUGAR TWICE DAILY 100 each 3   No current facility-administered medications on file prior to visit.     Allergies  Allergen Reactions  . Contrast Media [Iodinated Diagnostic Agents] Hives and Other (See Comments)    Spoke to patient, Iodine allergy is really IV contrast allergy.   [name] [Insulin Aspart] Shortness Of Breath and Other (See Comments)    "breathing problems"  . Codeine Nausea And Vomiting and Other (See Comments)    HIGH DOSES-SEVERE VOMITING  . Iodine Other (See Comments)    MUST  HAVE BENADRYL PRIOR TO PROCEDURE AND RIGHT BEFORE TREATMENT TO COUNTERACT REACTION-BLISTERING REACTION DERMATOLOGICAL  . Penicillins Itching, Rash and Other (See Comments)    Has patient had a PCN reaction causing immediate rash, facial/tongue/throat swelling, SOB or lightheadedness with hypotension: no Has  patient had a PCN reaction causing severe rash involving mucus membranes or skin necrosis: No Has patient had a PCN reaction that required hospitalization No Has patient had a PCN reaction occurring within the last 10 years: No If all of the above answers are "NO", then may proceed with Cephalosporin use.  CHEST SIZED RASH AND ITCHING   . Ace Inhibitors Cough  . Demerol [Meperidine] Nausea And Vomiting  . Dilaudid [Hydromorphone Hcl] Other (See Comments)    HEADACHE   . Neosporin [Neomycin-Bacitracin Zn-Polymyx] Itching, Rash and Other (See Comments)    MAKES REACTIONS WORSE WHEN USING AS PROPHYLACTIC  . Percocet [Oxycodone-Acetaminophen] Rash  . Tape Itching and Rash    Family History  Problem Relation Age of Onset  . Cancer Mother 32       bronchial cancer  . Breast cancer Mother   . Lung cancer Mother   . Hypertension Father   . COPD Father   . Heart disease Father 37       CAD with cardiac stenting  . Heart attack Father   . Parkinson's disease Father   . Allergies Sister   . Breast cancer Maternal Grandmother   . Emphysema Maternal Grandfather   . Leukemia Paternal Grandmother   . Emphysema Paternal Grandfather   . Thyroid disease Neg Hx     There were no vitals taken for this visit.   Review of Systems She denies hypoglycemia    Objective:   Physical Exam    Lab Results  Component Value Date   HGBA1C 6.6 (H) 10/18/2018   Lab Results  Component Value Date   TSH 0.474 10/18/2018   T4TOTAL 7.8 04/30/2015       Assessment & Plan:  Insulin-requiring type 2 DM: overcontrolled, this is the best control this pt should aim for, given this regimen, which does match insulin to her changing needs throughout the day.  Hyperthyroidism: well-controlled.    Patient Instructions  Please reduce the insulin to 16 units daily. Please continue the same methimazole check your blood sugar twice a day.  vary the time of day when you check, between before the 3 meals,  and at bedtime.  also check if you have symptoms of your blood sugar being too high or too low.  please keep a record of the readings and bring it to your next appointment here (or you can bring the meter itself).  You can write it on any piece of paper.  please call us sooner if your blood sugar goes below 70, or if you have a lot of readings over 200. Please come back for a follow-up appointment in 3 months.

## 2018-10-24 ENCOUNTER — Encounter: Payer: Self-pay | Admitting: Physical Therapy

## 2018-10-24 ENCOUNTER — Other Ambulatory Visit: Payer: Self-pay

## 2018-10-24 ENCOUNTER — Ambulatory Visit: Payer: Medicare PPO | Admitting: Physical Therapy

## 2018-10-24 DIAGNOSIS — R2689 Other abnormalities of gait and mobility: Secondary | ICD-10-CM | POA: Diagnosis not present

## 2018-10-24 DIAGNOSIS — R2681 Unsteadiness on feet: Secondary | ICD-10-CM

## 2018-10-24 DIAGNOSIS — M6281 Muscle weakness (generalized): Secondary | ICD-10-CM | POA: Diagnosis not present

## 2018-10-24 DIAGNOSIS — M79661 Pain in right lower leg: Secondary | ICD-10-CM

## 2018-10-24 DIAGNOSIS — R293 Abnormal posture: Secondary | ICD-10-CM

## 2018-10-24 NOTE — Therapy (Signed)
[facility] [address] [place], [place], [address] Phone: [phone]   Fax:  [phone]  Physical Therapy Treatment & 10th Visit Progress Note  Patient Details  Name: Judith [name] MRN: [id number] Date of Birth: [birth year] Referring Provider (PT): [name], MD   Encounter Date: 10/24/2018   CLINIC OPERATION CHANGES: [facility]patient [facility] is open at lower capacity following universal masking, social distancing, and patient screening.  The patient's COVID risk of complications score is 6.  Progress Note Reporting Period 08/17/2018 to 10/24/2018  See note below for Objective Data and Assessment of Progress/Goals.        PT End of Session - 10/24/18 1200    Visit Number  10   no change, arrived no charge   Number of Visits  25    Date for PT Re-Evaluation  11/15/18    Authorization Type  [employer] & Medicaid- 10th visit progress note    PT Start Time  1104    PT Stop Time  1150    PT Time Calculation (min)  46 min    Equipment Utilized During Treatment  Gait belt    Activity Tolerance  Patient tolerated treatment well;No increased pain    Behavior During Therapy  WFL for tasks assessed/performed       Past Medical History:  Diagnosis Date  . Abnormal EKG 07/31/2013  . Arthritis    "hands" (03/06/2015)  . Asthma   . Carpal tunnel syndrome, bilateral   . Chronic kidney disease   . Complication of anesthesia    slow to wake up. Pt sts she woke up during surgery many years ago.  . Coronary artery disease     2 v CAD with CTO of the RCA and high grade bifurcational LCx/OM stenosis. S/P PCI DES x 2 to the LCx/OM.  Marland Kitchen Diabetic peripheral neuropathy ([place]) "since 1996"  . GERD (gastroesophageal reflux disease)   . Goiter   . Headache    migraines prior to menopause  . History of shingles 06/01/2013  . Hyperlipidemia LDL goal <70 10/13/2015  . Hypertension   . Hyperthyroidism   . Osteomyelitis of  foot (HCC)    Right  . PAF (paroxysmal atrial fibrillation) ([place]) 04/29/2015   CHADS2VASC score of 5 now on Apixaban  . Pneumonia ~ 1976  . Sleep apnea    Bipap  . Tremors of nervous system   . Type II diabetes mellitus (HCC)    insulin dependent    Past Surgical History:  Procedure Laterality Date  . ABDOMINAL HYSTERECTOMY  1988   age 63; CERVICAL DYSPLASIA; ovaries intact.   . AMPUTATION Right 01/23/2016   Procedure: Right 3rd Ray Amputation;  Surgeon: [name], MD;  Location: [facility];  Service: Orthopedics;  Laterality: Right;  . AMPUTATION Right 02/13/2016   Procedure: Right Transmetatarsal Amputation;  Surgeon: [name], MD;  Location: [facility];  Service: Orthopedics;  Laterality: Right;  . AMPUTATION Right 04/15/2018   Procedure: AMPUTATION BELOW KNEE;  Surgeon: [name], MD;  Location: [facility];  Service: Orthopedics;  Laterality: Right;  . BIOPSY  04/16/2018   Procedure: BIOPSY;  Surgeon: Irene Shipper, MD;  Location: [facility] ENDOSCOPY;  Service: Endoscopy;;  . CARDIAC CATHETERIZATION N/A 02/27/2015   Procedure: Left Heart Cath and Coronary Angiography;  Surgeon: [name], MD; LAD 40%, mCFX 80%, OM 70%, RCA 100% calcified       . CARDIAC CATHETERIZATION N/A 03/06/2015   Procedure: Coronary  Stent Intervention;  Surgeon: [name], MD;  Location: [facility];  Service: Cardiovascular;  Laterality: N/A;  Mid CX 3.50x12 promus DES w/ 0% resdual and Prox OM1 2.50x20 promus DES w/ 20% residual  . CARDIOVERSION    . CARDIOVERSION N/A 05/22/2018   Procedure: CARDIOVERSION;  Surgeon: Skeet Latch, MD;  Location: [facility];  Service: Cardiovascular;  Laterality: N/A;  . CARPAL TUNNEL RELEASE Right Nov 2015  . CARPAL TUNNEL RELEASE Right 1992; 05/2014   [name];   . CESAREAN SECTION  1982; 1984  . ESOPHAGOGASTRODUODENOSCOPY (EGD) WITH PROPOFOL N/A 04/16/2018   Procedure: ESOPHAGOGASTRODUODENOSCOPY (EGD) WITH PROPOFOL;  Surgeon: Irene Shipper, MD;   Location: [facility] ENDOSCOPY;  Service: Endoscopy;  Laterality: N/A;  . FOOT NEUROMA SURGERY Bilateral 2000  . I&D EXTREMITY Left 01/06/2018   Procedure: DEBRIDEMENT ULCER LEFT FOOT;  Surgeon: [name], MD;  Location: [facility];  Service: Orthopedics;  Laterality: Left;  . KNEE ARTHROSCOPY Right ~ 2003   "meniscus repair"  . SHOULDER OPEN ROTATOR CUFF REPAIR Right 1996; 1998   "w/fracture repair"  . THYROID SURGERY  2000   "removed lots of nodules"  . TONSILLECTOMY  1976    There were no vitals filed for this visit.  Subjective Assessment - 10/24/18 1106    Subjective  She had cardiac workup and same as 4 years ago. They are scheduling [place] to look into lumps. No falls. She is wearing prosthesis most of awake hours.    Pertinent History  R TTA, asthma, CAD, IDDM2, neuropathy, shingles, HTN, Left non-traumatic intracerebral hemorrhage, CKD stage 3, right shoulder impingement syndrome    Limitations  Lifting;Standing;Walking;House hold activities    Patient Stated Goals  To use prosthesis to walk in community & home, gardening, going [place]    Currently in Pain?  No/denies         [facility] PT Assessment - 10/24/18 1100      Berg Balance Test   Sit to Stand  Able to stand  independently using hands    Standing Unsupported  Able to stand safely 2 minutes    Sitting with Back Unsupported but Feet Supported on Floor or Stool  Able to sit safely and securely 2 minutes    Stand to Sit  Sits safely with minimal use of hands    Transfers  Able to transfer safely, minor use of hands    Standing Unsupported with Eyes Closed  Able to stand 10 seconds with supervision    Standing Unsupported with Feet Together  Able to place feet together independently and stand for 1 minute with supervision    From Standing, Reach Forward with Outstretched Arm  Can reach forward >12 cm safely (5")    From Standing Position, Pick up Object from [place] to pick up shoe safely and easily    From Standing  Position, Turn to Look Behind Over each Shoulder  Looks behind one side only/other side shows less weight shift    Turn 360 Degrees  Able to turn 360 degrees safely but slowly    Standing Unsupported, Alternately Place Feet on Step/Stool  Able to complete 4 steps without aid or supervision    Standing Unsupported, One Foot in Front  Able to take small step independently and hold 30 seconds    Standing on One Leg  Tries to lift leg/unable to hold 3 seconds but remains standing independently    Total Score  42  [facility] PT Treatment/Exercise - 10/24/18 1100      Transfers   Transfers  Sit to Stand;Stand to Sit    Sit to Stand  5: Supervision;With upper extremity assist;From chair/3-in-1;With armrests    Stand to Sit  5: Supervision;With upper extremity assist;To chair/3-in-1;With armrests      Ambulation/Gait   Ambulation/Gait  Yes    Ambulation/Gait Assistance  5: Supervision;4: Min guard;4: Min assist    Ambulation/Gait Assistance Details  MinA with scanning & other adv gait activities.      Ambulation Distance (Feet)  300 Feet   300' X 2 no AD indoors, 500' outdoors cane   Assistive device  Straight cane;Prosthesis;None    Gait Pattern  Step-through pattern;Decreased step length - left;Decreased stance time - right;Narrow base of support    Ambulation Surface  Indoor;Level;Outdoor;Paved    Stairs  --    Stairs Assistance  --    Stair Management Technique  --    Number of Stairs  --    Ramp  5: Supervision;Other (comment)   cane & prosthesis, carrying heavy purse/bag   Curb  5: Supervision   cane & prosthesis, carrying heavy purse/bag     High Level Balance   High Level Balance Activities  Side stepping;Backward walking;Head turns;Negotitating around obstacles;Negotiating over obstacles;Other (comment)   no AD except prosthesis   High Level Balance Comments  in hall for visual reference of walls: scanning right/left, up/down & diagonals.  Manual  /tactile & verbal cues for balance reactions.       Self-Care   Self-Care  Lifting    Lifting  PT demo & verbal cues on technique with TTA prosthesis. Pt return demo lifing light items 3 reps with min guard and lifting / carryin laundry basket medium load 5 reps with min guard.       Prosthetics   Prosthetic Care Comments   --    Current prosthetic wear tolerance (days/week)   daily    Current prosthetic wear tolerance (#hours/day)   most of awake hours    Residual limb condition   no open areas,     Education Provided  Correct ply sock adjustment    Person(s) Educated  Patient    Education Method  Explanation;Demonstration;Verbal cues    Education Method  Verbalized understanding;Returned demonstration;Needs further instruction    Donning Prosthesis  Modified independent (device/increased time)    Doffing Prosthesis  Modified independent (device/increased time)               PT Short Term Goals - 10/24/18 1234      PT SHORT TERM GOAL #1   Title  Patient demonstrates proper adjustment of ply socks with limb volume changes. (All STGs Target Date 10/20/2018)    Baseline  MET 10/24/2018    Time  4    Period  Weeks    Status  Achieved    Target Date  10/20/18      PT SHORT TERM GOAL #2   Title  Patient tolerates prosthesis wear >12hrs total /day without skin issues.     Baseline  MET 10/24/2018    Time  4    Period  Weeks    Status  Achieved    Target Date  10/20/18      PT SHORT TERM GOAL #3   Title  Berg Balance >/= 40/56    Baseline  MET 10/24/2018  Berg 42/56    Time  4    Period  Weeks  Status  Achieved    Target Date  10/20/18      PT SHORT TERM GOAL #4   Title  Patient ambulates 300' with cane or less & prosthesis with supervision.     Baseline  MET 10/24/2018    Time  4    Period  Weeks    Status  Achieved    Target Date  10/20/18      PT SHORT TERM GOAL #5   Title  Patient negotiates stairs with 2 rails, ramp & curb with cane or less & prosthesis with  supervision.     Baseline  MET 10/24/2018    Time  4    Period  Weeks    Status  Achieved    Target Date  10/20/18        PT Long Term Goals - 09/14/18 2135      PT LONG TERM GOAL #1   Title  Patient verbalizes & demonstrates understanding of prosthetic care to enable safe use of prosthesis (All LTGs Target Date 11/15/2018)    Time  12    Period  Weeks    Status  On-going    Target Date  11/15/18      PT LONG TERM GOAL #2   Title  Patient tolerates prosthesis wear >90% of awake hours without skin or pain issues to enable function throughout her day.     Time  12    Period  Weeks    Status  On-going    Target Date  11/15/18      PT LONG TERM GOAL #3   Title  Berg Balance >/= 45/56 to indicate lower fall risk.     Time  12    Period  Weeks    Status  On-going    Target Date  11/15/18      PT LONG TERM GOAL #4   Title  Patient ambulates >500' outdoors with LRAD modified independent to enable safe function in community.     Time  12    Period  Weeks    Status  On-going    Target Date  11/15/18      PT LONG TERM GOAL #5   Title  Patient negotiates stairs, ramps & curbs with LRAD & Prosthesis modified independent to enable community access.     Time  12    Period  Weeks    Status  On-going    Target Date  11/15/18      PT LONG TERM GOAL #6   Title  Patient ambulates 54' around furniture with cane or less & prosthesis carrying household items modified independent for household function.     Time  12    Period  Weeks    Status  On-going    Target Date  11/15/18            Plan - 10/24/18 1239    Clinical Impression Statement  Patient met all STGs assessed today. She is on target to meet LTG by Target Date. She tolerated activity today with chest pain, shortness of breath beyond typical for activity level. Patient was challenged by scanning & walking with assistive device but improved with skilled cues & repetition.    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 prosthetic gait with cane for community /outdoors & no device indoors including scanning, carrying items & negotiating barriers.    Consulted and Agree with Plan of Care  Patient       Patient will benefit from skilled therapeutic intervention in order to improve the following deficits and impairments:  Abnormal gait, Decreased activity tolerance, Decreased balance, Decreased endurance, Decreased knowledge of use of DME, Decreased mobility, Decreased range of motion, Decreased strength, Dizziness, Increased edema, Postural dysfunction, Prosthetic Dependency, Pain  Visit Diagnosis: 1. Unsteadiness on feet   2. Abnormal posture   3. Muscle weakness (generalized)   4. Other abnormalities of gait and mobility   5. Pain in right lower leg        Problem List Patient Active Problem List   Diagnosis Date Noted  . Diarrhea 09/05/2018  . Urinary tract infection 06/30/2018  . ICH (intracerebral hemorrhage) ([place]) 06/27/2018  . DKA  (diabetic ketoacidoses) ([place]) 06/26/2018  . V-tach ([place]) 05/14/2018  . Seizure ([place]) 05/14/2018  . Slow transit constipation   . Fall   . Labile blood glucose   . Labile blood pressure   . Chronic combined systolic and diastolic congestive heart failure ([place])   . Chronic diastolic congestive heart failure ([place])   . CKD (chronic kidney disease), stage III ([place])   . Type 2 diabetes mellitus with peripheral neuropathy (HCC)   . Amputation of right lower extremity below knee ([place]) 04/20/2018  . 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 Post-operative pain   . PAF (paroxysmal atrial fibrillation) ([place])   . Duodenal ulcer   . Atrial fibrillation with RVR ([place]) 04/14/2018  . Foot osteomyelitis, right ([place]) 04/14/2018  . Cutaneous abscess of left foot   . Acute respiratory failure with hypoxia ([place])   . Cellulitis 01/02/2018  . LGI bleed   . Acute blood loss anemia   . Wide-complex tachycardia ([place]) 07/04/2017  . Type II diabetes mellitus, uncontrolled ([place]) 07/04/2017  . Peripheral neuropathy 07/04/2017  . H/O hyperthyroidism 07/04/2017  . Atrial fibrillation with rapid ventricular response ([place]) 08/06/2016  . S/P transmetatarsal amputation of foot, right ([place]) 04/19/2016  . Obstructive sleep apnea 11/26/2015  . Bilateral carpal tunnel syndrome 11/26/2015  . Hypomagnesemia 11/16/2015  . Hyperlipidemia LDL goal <70 10/13/2015  . Abnormality of gait 09/02/2015  . Memory loss 08/12/2015  . Diabetic peripheral neuropathy ([place]) 08/12/2015  . Vitamin D deficiency 08/12/2015  . Hyperthyroidism 04/30/2015  . Heme positive stool   . Persistent atrial fibrillation 04/29/2015  . Coronary artery disease with stable angina pectoris ([place]) 03/29/2015  . Abnormal nuclear stress test   . History of goiter 09/28/2014  . GERD (gastroesophageal reflux disease) 07/31/2013  . Depression with anxiety 06/01/2013  . DM (diabetes mellitus),  type 2 with peripheral vascular complications ([place]) 46/56/8127  . HTN (hypertension) 01/30/2013    Kearsten Ginther PT, DPT 10/24/2018, 12:47 PM  [facility] [address] [place], [place], [address]  Phone: [phone]   Fax:  [phone]  Name: Judith [name] MRN: [id number] Date of Birth: Dec 31, 1950

## 2018-10-25 ENCOUNTER — Encounter: Payer: Self-pay | Admitting: Family Medicine

## 2018-10-26 ENCOUNTER — Encounter: Payer: Self-pay | Admitting: Physical Therapy

## 2018-10-26 ENCOUNTER — Ambulatory Visit: Payer: Medicare PPO | Admitting: Physical Therapy

## 2018-10-26 ENCOUNTER — Other Ambulatory Visit: Payer: Self-pay

## 2018-10-26 DIAGNOSIS — R2689 Other abnormalities of gait and mobility: Secondary | ICD-10-CM | POA: Diagnosis not present

## 2018-10-26 DIAGNOSIS — M6281 Muscle weakness (generalized): Secondary | ICD-10-CM | POA: Diagnosis not present

## 2018-10-26 DIAGNOSIS — R293 Abnormal posture: Secondary | ICD-10-CM | POA: Diagnosis not present

## 2018-10-26 DIAGNOSIS — R2681 Unsteadiness on feet: Secondary | ICD-10-CM

## 2018-10-26 DIAGNOSIS — M79661 Pain in right lower leg: Secondary | ICD-10-CM | POA: Diagnosis not present

## 2018-10-26 NOTE — Therapy (Signed)
[facility] [address] [place], [place], [address] Phone: [phone]   Fax:  [phone]  Physical Therapy Treatment  Patient Details  Name: Judith [name] MRN: [id number] Date of Birth: 19-Aug-1950 Referring Provider (PT): [name], MD   Encounter Date: 10/26/2018  PT End of Session - 10/26/18 1224    Visit Number  11    Number of Visits  25    Date for PT Re-Evaluation  11/15/18    Authorization Type  [employer] & Medicaid- 10th visit progress note    PT Start Time  1222   pt late today   PT Stop Time  1254    PT Time Calculation (min)  32 min    Equipment Utilized During Treatment  Gait belt    Activity Tolerance  Patient tolerated treatment well;No increased pain    Behavior During Therapy  WFL for tasks assessed/performed       Past Medical History:  Diagnosis Date  . Abnormal EKG 07/31/2013  . Arthritis    "hands" (03/06/2015)  . Asthma   . Carpal tunnel syndrome, bilateral   . Chronic kidney disease   . Complication of anesthesia    slow to wake up. Pt sts she woke up during surgery many years ago.  . Coronary artery disease     2 v CAD with CTO of the RCA and high grade bifurcational LCx/OM stenosis. S/P PCI DES x 2 to the LCx/OM.  Marland Kitchen Diabetic peripheral neuropathy ([place]) "since 1996"  . GERD (gastroesophageal reflux disease)   . Goiter   . Headache    migraines prior to menopause  . History of shingles 06/01/2013  . Hyperlipidemia LDL goal <70 10/13/2015  . Hypertension   . Hyperthyroidism   . Osteomyelitis of foot (HCC)    Right  . PAF (paroxysmal atrial fibrillation) ([place]) 04/29/2015   CHADS2VASC score of 5 now on Apixaban  . Pneumonia ~ 1976  . Sleep apnea    Bipap  . Tremors of nervous system   . Type II diabetes mellitus (HCC)    insulin dependent    Past Surgical History:  Procedure Laterality Date  . ABDOMINAL HYSTERECTOMY  1988   age 31; CERVICAL DYSPLASIA; ovaries  intact.   . AMPUTATION Right 01/23/2016   Procedure: Right 3rd Ray Amputation;  Surgeon: [name], MD;  Location: [facility];  Service: Orthopedics;  Laterality: Right;  . AMPUTATION Right 02/13/2016   Procedure: Right Transmetatarsal Amputation;  Surgeon: [name], MD;  Location: [facility];  Service: Orthopedics;  Laterality: Right;  . AMPUTATION Right 04/15/2018   Procedure: AMPUTATION BELOW KNEE;  Surgeon: [name], MD;  Location: [facility];  Service: Orthopedics;  Laterality: Right;  . BIOPSY  04/16/2018   Procedure: BIOPSY;  Surgeon: Irene Shipper, MD;  Location: [facility] ENDOSCOPY;  Service: Endoscopy;;  . CARDIAC CATHETERIZATION N/A 02/27/2015   Procedure: Left Heart Cath and Coronary Angiography;  Surgeon: [name], MD; LAD 40%, mCFX 80%, OM 70%, RCA 100% calcified       . CARDIAC CATHETERIZATION N/A 03/06/2015   Procedure: Coronary Stent Intervention;  Surgeon: [name], MD;  Location: [facility];  Service: Cardiovascular;  Laterality: N/A;  Mid CX 3.50x12 promus DES w/ 0% resdual and Prox OM1 2.50x20 promus DES w/ 20% residual  . CARDIOVERSION    . CARDIOVERSION N/A 05/22/2018   Procedure: CARDIOVERSION;  Surgeon: Skeet Latch, MD;  Location: [facility];  Service: Cardiovascular;  Laterality: N/A;  . CARPAL TUNNEL RELEASE Right Nov 2015  . CARPAL TUNNEL RELEASE Right 1992; 05/2014   [name]; [place]  . CESAREAN SECTION  1982; 1984  . ESOPHAGOGASTRODUODENOSCOPY (EGD) WITH PROPOFOL N/A 04/16/2018   Procedure: ESOPHAGOGASTRODUODENOSCOPY (EGD) WITH PROPOFOL;  Surgeon: Irene Shipper, MD;  Location: [facility] ENDOSCOPY;  Service: Endoscopy;  Laterality: N/A;  . FOOT NEUROMA SURGERY Bilateral 2000  . I&D EXTREMITY Left 01/06/2018   Procedure: DEBRIDEMENT ULCER LEFT FOOT;  Surgeon: [name], MD;  Location: [facility];  Service: Orthopedics;  Laterality: Left;  . KNEE ARTHROSCOPY Right ~ 2003   "meniscus repair"  . SHOULDER OPEN ROTATOR CUFF REPAIR Right 1996; 1998    "w/fracture repair"  . THYROID SURGERY  2000   "removed lots of nodules"  . TONSILLECTOMY  1976    There were no vitals filed for this visit.  Subjective Assessment - 10/26/18 1222    Subjective  No new complaints. Late today because she got lost driving here and could not get Seri to call/work correctly. No falls or pain to report. Trying to get in touch with Md about getting the ultrasound to her legs to eval the lumps.    Pertinent History  R TTA, asthma, CAD, IDDM2, neuropathy, shingles, HTN, Left non-traumatic intracerebral hemorrhage, CKD stage 3, right shoulder impingement syndrome    Limitations  Lifting;Standing;Walking;House hold activities    Patient Stated Goals  To use prosthesis to walk in community & home, gardening, [place]    Currently in Pain?  No/denies    Pain Score  0-No pain            [facility] Adult PT Treatment/Exercise - 10/26/18 1224      Ambulation/Gait   Ambulation/Gait  Yes    Ambulation/Gait Assistance  5: Supervision;4: Min guard    Ambulation/Gait Assistance Details  gait with cane into/out of gym. in session no device except for cane. gait around track with head movements scanning enviroment all directions with min guard assist, no balance issues.     Ambulation Distance (Feet)  230 Feet   x1 with scanning, plus around gym   Assistive device  Straight cane;Prosthesis;None    Gait Pattern  Step-through pattern;Decreased step length - left;Decreased stance time - right;Narrow base of support    Ambulation Surface  Level;Indoor      High Level Balance   High Level Balance Activities  Side stepping;Marching forwards;Marching backwards;Tandem walking   tandem gait fwd/bwd   High Level Balance Comments  on red mat next to counter top: 3 laps each with cues on form/technique, min guard to min assist for balance.       Prosthetics   Current prosthetic wear tolerance (days/week)   daily    Current prosthetic wear tolerance (#hours/day)    most of awake hours    Residual limb condition   no issues per pt report    Education Provided  Correct ply sock adjustment    Person(s) Educated  Patient    Education Method  Explanation;Demonstration;Verbal cues    Education Method  Verbalized understanding;Returned demonstration;Verbal cues required;Needs further instruction    Donning Prosthesis  Modified independent (device/increased time)    Doffing Prosthesis  Modified independent (device/increased time)          Balance Exercises - 10/26/18 1249      Balance Exercises: Standing   Rockerboard  Anterior/posterior;Head turns;30 seconds;10 reps;EO;EC      Balance Exercises: Standing   Rebounder Limitations  on balance board in ant/post direction: holding the board steady for EC no head movements 30 sec's x 3 reps, then with EO head movements left<>right, then up<>down for 10 reps each. min to mod assist for balance with cues on posture and weight shifting for balance. pt with posterior balance loss preference.           PT Short Term Goals - 10/24/18 1234      PT SHORT TERM GOAL #1   Title  Patient demonstrates proper adjustment of ply socks with limb volume changes. (All STGs Target Date 10/20/2018)    Baseline  MET 10/24/2018    Time  4    Period  Weeks    Status  Achieved    Target Date  10/20/18      PT SHORT TERM GOAL #2   Title  Patient tolerates prosthesis wear >12hrs total /day without skin issues.     Baseline  MET 10/24/2018    Time  4    Period  Weeks    Status  Achieved    Target Date  10/20/18      PT SHORT TERM GOAL #3   Title  Berg Balance >/= 40/56    Baseline  MET 10/24/2018  Berg 42/56    Time  4    Period  Weeks    Status  Achieved    Target Date  10/20/18      PT SHORT TERM GOAL #4   Title  Patient ambulates 300' with cane or less & prosthesis with supervision.     Baseline  MET 10/24/2018    Time  4    Period  Weeks    Status  Achieved    Target Date  10/20/18      PT SHORT TERM GOAL #5    Title  Patient negotiates stairs with 2 rails, ramp & curb with cane or less & prosthesis with supervision.     Baseline  MET 10/24/2018    Time  4    Period  Weeks    Status  Achieved    Target Date  10/20/18        PT Long Term Goals - 09/14/18 2135      PT LONG TERM GOAL #1   Title  Patient verbalizes & demonstrates understanding of prosthetic care to enable safe use of prosthesis (All LTGs Target Date 11/15/2018)    Time  12    Period  Weeks    Status  On-going    Target Date  11/15/18      PT LONG TERM GOAL #2   Title  Patient tolerates prosthesis wear >90% of awake hours without skin or pain issues to enable function throughout her day.     Time  12    Period  Weeks    Status  On-going    Target Date  11/15/18      PT LONG TERM GOAL #3   Title  Berg Balance >/= 45/56 to indicate lower fall risk.     Time  12    Period  Weeks    Status  On-going    Target Date  11/15/18      PT LONG TERM GOAL #4   Title  Patient ambulates >500' outdoors with LRAD modified independent to enable safe function in community.     Time  12    Period  Weeks    Status  On-going    Target Date  11/15/18  PT LONG TERM GOAL #5   Title  Patient negotiates stairs, ramps & curbs with LRAD & Prosthesis modified independent to enable community access.     Time  12    Period  Weeks    Status  On-going    Target Date  11/15/18      PT LONG TERM GOAL #6   Title  Patient ambulates 52' around furniture with cane or less & prosthesis carrying household items modified independent for household function.     Time  12    Period  Weeks    Status  On-going    Target Date  11/15/18            Plan - 10/26/18 1224    Clinical Impression Statement  Today's skilled session continued to address dynamic gait with prosthesis only and balance reactions on compliant surfaces. No issues reported with session today. Pt is progressing toward goals and should benefit from continued PT to progress  toward unmet goals.    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 prosthetic gait with cane for community /outdoors & no device indoors including scanning, carrying items & negotiating barriers.    Consulted and Agree with Plan of Care  Patient       Patient will benefit from skilled therapeutic intervention in order to improve the following deficits and impairments:  Abnormal gait, Decreased activity tolerance, Decreased balance, Decreased endurance, Decreased knowledge of use of DME, Decreased mobility, Decreased range of motion, Decreased strength, Dizziness, Increased edema, Postural dysfunction, Prosthetic Dependency, Pain  Visit Diagnosis: 1. Unsteadiness on feet   2. Muscle weakness (generalized)   3. Other abnormalities of gait and mobility   4. Abnormal posture        Problem List Patient Active Problem List   Diagnosis Date Noted  . Diarrhea 09/05/2018  . Urinary tract infection 06/30/2018  . ICH (intracerebral  hemorrhage) ([place]) 06/27/2018  . DKA (diabetic ketoacidoses) ([place]) 06/26/2018  . V-tach ([place]) 05/14/2018  . Seizure ([place]) 05/14/2018  . Slow transit constipation   . Fall   . Labile blood glucose   . Labile blood pressure   . Chronic combined systolic and diastolic congestive heart failure ([place])   . Chronic diastolic congestive heart failure ([place])   . CKD (chronic kidney disease), stage III ([place])   . Type 2 diabetes mellitus with peripheral neuropathy (HCC)   . Amputation of right lower extremity below knee ([place]) 04/20/2018  . 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 Post-operative pain   . PAF (paroxysmal atrial fibrillation) ([place])   . Duodenal ulcer   . Atrial fibrillation with RVR ([place]) 04/14/2018  . Foot osteomyelitis, right ([place]) 04/14/2018  . Cutaneous abscess of left foot   .  Acute respiratory failure with hypoxia ([place])   . Cellulitis 01/02/2018  . LGI bleed   . Acute blood loss anemia   . Wide-complex tachycardia ([place]) 07/04/2017  . Type II diabetes mellitus, uncontrolled ([place]) 07/04/2017  . Peripheral neuropathy 07/04/2017  . H/O hyperthyroidism 07/04/2017  . Atrial fibrillation with rapid ventricular response ([place]) 08/06/2016  . S/P transmetatarsal amputation of foot, right ([place]) 04/19/2016  . Obstructive sleep apnea 11/26/2015  . Bilateral carpal tunnel syndrome 11/26/2015  . Hypomagnesemia 11/16/2015  . Hyperlipidemia LDL goal <70 10/13/2015  . Abnormality of gait 09/02/2015  . Memory loss 08/12/2015  . Diabetic peripheral neuropathy ([place]) 08/12/2015  . Vitamin D deficiency 08/12/2015  . Hyperthyroidism 04/30/2015  . Heme positive stool   . Persistent atrial fibrillation 04/29/2015  . Coronary artery disease with stable angina pectoris ([place]) 03/29/2015  . Abnormal nuclear stress test   . History of goiter 09/28/2014  . GERD (gastroesophageal reflux disease) 07/31/2013  . Depression with anxiety  06/01/2013  . DM (diabetes mellitus), type 2 with peripheral vascular complications ([place]) 85/92/7639  . HTN (hypertension) 01/30/2013    Willow Ora, PTA, [facility] [address] [place], [place] [address] [phone] 10/26/18, 2:51 PM   Name: Judith [name] MRN: [id number] Date of Birth: 15-Aug-1950

## 2018-10-26 NOTE — Progress Notes (Signed)
Only requires Monitoring at this time with the retinopathy

## 2018-10-31 ENCOUNTER — Ambulatory Visit
Admission: RE | Admit: 2018-10-31 | Discharge: 2018-10-31 | Disposition: A | Discharge status: Home / Self Care | Payer: Medicare PPO | Source: Ambulatory Visit | Attending: Family Medicine | Admitting: Family Medicine

## 2018-10-31 ENCOUNTER — Ambulatory Visit: Payer: Medicare PPO | Admitting: Physical Therapy

## 2018-10-31 DIAGNOSIS — R2241 Localized swelling, mass and lump, right lower limb: Secondary | ICD-10-CM | POA: Diagnosis not present

## 2018-11-01 ENCOUNTER — Telehealth: Payer: Self-pay | Admitting: *Deleted

## 2018-11-01 NOTE — Telephone Encounter (Signed)
Called pt re: appt 11/06/2018.  Need to change to in-office and prescreen for covid 19. Left a message for pt to call back.

## 2018-11-02 ENCOUNTER — Encounter: Payer: Self-pay | Admitting: Family Medicine

## 2018-11-02 ENCOUNTER — Ambulatory Visit: Payer: Medicare PPO | Admitting: Physical Therapy

## 2018-11-02 ENCOUNTER — Encounter: Payer: Self-pay | Admitting: Physical Therapy

## 2018-11-02 ENCOUNTER — Other Ambulatory Visit: Payer: Self-pay

## 2018-11-02 ENCOUNTER — Telehealth: Payer: Self-pay | Admitting: Cardiology

## 2018-11-02 DIAGNOSIS — M6281 Muscle weakness (generalized): Secondary | ICD-10-CM

## 2018-11-02 DIAGNOSIS — R2689 Other abnormalities of gait and mobility: Secondary | ICD-10-CM

## 2018-11-02 DIAGNOSIS — R2681 Unsteadiness on feet: Secondary | ICD-10-CM | POA: Diagnosis not present

## 2018-11-02 DIAGNOSIS — R293 Abnormal posture: Secondary | ICD-10-CM | POA: Diagnosis not present

## 2018-11-02 DIAGNOSIS — M79661 Pain in right lower leg: Secondary | ICD-10-CM | POA: Diagnosis not present

## 2018-11-02 NOTE — Telephone Encounter (Signed)
Returned pts call and left another message for her to call back.  (if pt calls back, let her know there has been several attempts to reach her and her phone will ring one time and then goes straight to voicemail)

## 2018-11-02 NOTE — Telephone Encounter (Signed)
New Message     Pt says she had an Ultra sound done this past week and would like to go over the results at her upcomming appt

## 2018-11-02 NOTE — Telephone Encounter (Signed)
Follow up ° ° ° ° °COVID SCREENING QUESTIONS FOR IN-OFFICE VISITS - PLEASE DOCUMENT PATIENT ANSWERS ° °1. Do you currently have a fever? ° °a. NO ° °2. Have you recently traveled on a cruise, internationally, or to [place], [place], [place], [place], [place], or [place], [place] ([employer])? ° °a. NO ° °3. Have you been in contact with someone that is currently pending confirmation of Covid19 testing or has been confirmed to have the Covid19 virus? ° °a. NO ° °4. Are you currently experiencing fatigue or cough? ° °a. NO ° ° °

## 2018-11-03 ENCOUNTER — Encounter: Payer: Self-pay | Admitting: *Deleted

## 2018-11-03 ENCOUNTER — Telehealth: Payer: Self-pay

## 2018-11-03 NOTE — Telephone Encounter (Signed)
After several attempts to reach pt re: lab results, letter has been mailed to pt's address on file.

## 2018-11-03 NOTE — Telephone Encounter (Signed)
Left patient a message in regards to CoVid pre-screening for upcoming appointment 6/29 with Judith [name].

## 2018-11-03 NOTE — Telephone Encounter (Signed)
Left message for pt to call back about upcoming appt for covid screening and new protocol for in office visit.

## 2018-11-05 NOTE — Progress Notes (Addendum)
Cardiology Office Note   Date:  11/06/2018   ID:  Judith, [name] July 21, 1950, MRN [id number]  PCP:  Rutherford Guys, MD  Cardiologist:  Dr. Radford Pax    Chief Complaint  Patient presents with   Shortness of Breath      History of Present Illness: Judith [name] is a 68 y.o. female who presents for follow up of Dyspnea and edema.  Stress test was neg.   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  While in the hospital she had NSVT versus aberrancy and sotalol was stopped. Patient had recurrent A. fib and underwent cardioversion 05/22/2018 but unfortunately was back in A. fib with RVR when she saw Roderic Palau, NP 05/30/2018. She was on amiodarone load and Eliquis. She could not increase her diltiazem further because of soft blood pressures Stopped her Lasix at that time.. She recommended that the patient could continue to load on amiodarone and bring her back in 10 days to arrange for cardioversion. Back in the A. fib clinic 06/08/2018 was back in normal sinus rhythm. Amiodarone decreased to 200 mg daily. Was also in ER with low magnesium and Lasix was decreased to 20 mg daily.  On last visit 06/21/18 she was complaining of shoulder pain and needs rotator cuff surgery so she can do rehab on her BKA. She has had no further fluid buildup. She is getting regular magnesium checks and infusions because the Lasix has dropped her magnesium. She is down to 20 mg of Lasix daily. She was most recently hospitalized in February at [facility] for DKA and incidental L thalamic ICH.  on amiodarone and eliquis this was resumed at discharge.    Last visit she has had some SOB and chest tightness.  She had one episode at night she developed acute SOB and had trouble wearing her BiPAP.   Her wt was up !& lbs but she has her prostheses on..  She continues with some lower ext edema of Lt leg.  Her rt thigh she feels knots, some have been felt to be cysts and others unsure, she has been seen by ortho.   Her diabetes is stable.  Her EKG is stable.  No recent a fib.  She was having angina and nuc study was neg.  Reassuring.  She was asked to call PCP for anemia. Added lasix and K+ for edema.    Today back for follow up only taking lasix prn about 3X week for edema.  She will only take k+ with the lasix.  Her last Mg+ was normal  - she has had ultrasound of her leg with knots, prob cysts PCP is following.  No further SOB she does have indigestion I have asked her to increase pepcid to twice a day.  She needs Rt total shoulder she is unsure when it will be.     Past Medical History:  Diagnosis Date   Abnormal EKG 07/31/2013   Arthritis    "hands" (03/06/2015)   Asthma    Carpal tunnel syndrome, bilateral    Chronic kidney disease    Complication of anesthesia    slow to wake up. Pt sts she woke up during surgery many years ago.   Coronary artery disease     2 v CAD with CTO of the RCA  and high grade bifurcational LCx/OM stenosis. S/P PCI DES x 2 to the LCx/OM.   Diabetic peripheral neuropathy ([place]) "since 1996"   GERD (gastroesophageal reflux disease)    Goiter    Headache    migraines prior to menopause   History of shingles 06/01/2013   Hyperlipidemia LDL goal <70 10/13/2015   Hypertension    Hyperthyroidism    Osteomyelitis of foot (HCC)    Right   PAF (paroxysmal atrial fibrillation) ([place]) 04/29/2015   CHADS2VASC score of 5 now on Apixaban   Pneumonia ~ 1976   Sleep apnea    Bipap   Tremors of nervous system    Type II diabetes mellitus (HCC)    insulin dependent    Past Surgical History:  Procedure Laterality Date   ABDOMINAL HYSTERECTOMY  27   age 42; CERVICAL DYSPLASIA; ovaries intact.    AMPUTATION Right 01/23/2016   Procedure:  Right 3rd Ray Amputation;  Surgeon: [name], MD;  Location: [place];  Service: Orthopedics;  Laterality: Right;   AMPUTATION Right 02/13/2016   Procedure: Right Transmetatarsal Amputation;  Surgeon: [name], MD;  Location: [place];  Service: Orthopedics;  Laterality: Right;   AMPUTATION Right 04/15/2018   Procedure: AMPUTATION BELOW KNEE;  Surgeon: [name], MD;  Location: [place];  Service: Orthopedics;  Laterality: Right;   BIOPSY  04/16/2018   Procedure: BIOPSY;  Surgeon: Irene Shipper, MD;  Location[facility] ENDOSCOPY;  Service: Endoscopy;;   CARDIAC CATHETERIZATION N/A 02/27/2015   Procedure: Left Heart Cath and Coronary Angiography;  Surgeon: [name], MD; LAD 40%, mCFX 80%, OM 70%, RCA 100% calcified        CARDIAC CATHETERIZATION N/A 03/06/2015   Procedure: Coronary Stent Intervention;  Surgeon: [name], MD;  Location: [place] [facility];  Service: Cardiovascular;  Laterality: N/A;  Mid CX 3.50x12 promus DES w/ 0% resdual and Prox OM1 2.50x20 promus DES w/ 20% residual   CARDIOVERSION     CARDIOVERSION N/A 05/22/2018   Procedure: CARDIOVERSION;  Surgeon: Skeet Latch, MD;  Location: [place];  Service: Cardiovascular;  Laterality: N/A;   CARPAL TUNNEL RELEASE Right Nov 2015   CARPAL TUNNEL RELEASE Right 1992; 05/2014   [name]; [place]; 1984   ESOPHAGOGASTRODUODENOSCOPY (EGD) WITH PROPOFOL N/A 04/16/2018   Procedure: ESOPHAGOGASTRODUODENOSCOPY (EGD) WITH PROPOFOL;  Surgeon: Irene Shipper, MD;  Location: [facility] ENDOSCOPY;  Service: Endoscopy;  Laterality: N/A;   FOOT NEUROMA SURGERY Bilateral 2000   I&D EXTREMITY Left 01/06/2018   Procedure: DEBRIDEMENT ULCER LEFT FOOT;  Surgeon: [name], MD;  Location: [place];  Service: Orthopedics;  Laterality: Left;   KNEE ARTHROSCOPY Right ~ 2003   "meniscus repair"   SHOULDER OPEN ROTATOR CUFF REPAIR Right 1996; 1998   "w/fracture repair"   THYROID SURGERY  2000   "removed  lots of nodules"   TONSILLECTOMY  1976     Current Outpatient Medications  Medication Sig Dispense Refill   acetaminophen (TYLENOL) 325 MG tablet Take 1-2 tablets (325-650 mg total) by mouth every 6 (six) hours as needed for mild pain (pain score 1-3 or temp > 100.5).     albuterol (PROVENTIL HFA) 108 (90 Base) MCG/ACT inhaler Inhale 1-2 puffs into the lungs every 6 (six) hours as needed for wheezing or shortness of breath. 1 Inhaler 0   amiodarone (PACERONE) 200 MG tablet Take 1 tablet (200 mg total) by mouth daily. 30 tablet 6   apixaban (ELIQUIS)  5 MG TABS tablet Take 1 tablet (5 mg total) by mouth 2 (two) times daily. 180 tablet 3   atorvastatin (LIPITOR) 20 MG tablet Take 1 tablet (20 mg total) by mouth daily. 90 tablet 3   buPROPion (WELLBUTRIN SR) 150 MG 12 hr tablet Take 1 tablet (150 mg total) by mouth 2 (two) times daily. 60 tablet 1   diltiazem (CARDIZEM CD) 120 MG 24 hr capsule Take 1 capsule (120 mg total) by mouth daily. 90 capsule 1   famotidine (PEPCID) 20 MG tablet Take 1 tablet (20 mg total) by mouth 2 (two) times daily. 60 tablet 3   ferrous gluconate (FERGON) 324 MG tablet Take 1 tablet (324 mg total) by mouth daily with breakfast. 90 tablet 0   FLUoxetine (PROZAC) 20 MG capsule TAKE 1 CAPSULE AT BEDTIME 90 capsule 1   furosemide (LASIX) 20 MG tablet Take 1 tablet (20 mg total) by mouth daily. 90 tablet 3   hydrALAZINE (APRESOLINE) 50 MG tablet Take 1 tablet (50 mg total) by mouth 3 (three) times daily. 270 tablet 3   insulin degludec (TRESIBA FLEXTOUCH) 100 UNIT/ML SOPN FlexTouch Pen Inject 0.16 mLs (16 Units total) into the skin daily. 15 mL 3   magnesium oxide (MAG-OX) 400 (241.3 Mg) MG tablet TAKE 1 TABLET EVERY DAY (Patient taking differently: Take 400 mg by mouth 2 (two) times daily. ) 90 tablet 0   Melatonin 10 MG TABS Take 1 tablet by mouth at bedtime.     metFORMIN (GLUCOPHAGE) 1000 MG tablet Take 1 tablet (1,000 mg total) by mouth daily with  breakfast. 30 tablet 11   methimazole (TAPAZOLE) 10 MG tablet TAKE 1 TABLET (10 MG TOTAL) BY MOUTH DAILY. 90 tablet 0   metoprolol succinate (TOPROL-XL) 25 MG 24 hr tablet Take 25 mg by mouth 2 (two) times daily.     mupirocin ointment (BACTROBAN) 2 % Apply 1 application topically 3 (three) times daily. 22 g 1   nitroGLYCERIN (NITROSTAT) 0.4 MG SL tablet Place under the tongue.     polyethylene glycol powder (GLYCOLAX/MIRALAX) 17 GM/SCOOP powder Take by mouth.     potassium chloride (K-DUR) 10 MEQ tablet Take 1 tablet (10 mEq total) by mouth daily. 90 tablet 3   Respiratory Therapy Supplies DEVI BI-PAP @@ Bedtime     traMADol (ULTRAM) 50 MG tablet Take 50 mg by mouth every 6 (six) hours as needed.     triamcinolone (KENALOG) 0.1 % paste APPLY TO APHTHEOUS ULCER LESION TWICE DAILY     TRUE METRIX BLOOD GLUCOSE TEST test strip TEST BLOOD SUGAR TWICE DAILY 100 each 3   No current facility-administered medications for this visit.     Allergies:   Contrast media [iodinated diagnostic agents], Novolog [insulin aspart], Codeine, Iodine, Penicillins, Ace inhibitors, Demerol [meperidine], Dilaudid [hydromorphone hcl], Neosporin [neomycin-bacitracin zn-polymyx], Percocet [oxycodone-acetaminophen], and Tape    Social History:  The patient  reports that she quit smoking about 3 years ago. Her smoking use included cigarettes. She smoked 0.00 packs per day for 41.00 years. She has never used smokeless tobacco. She reports that she does not drink alcohol or use drugs.   Family History:  The patient's family history includes Allergies in her sister; Breast cancer in her maternal grandmother and mother; COPD in her father; Cancer (age of onset: 92) in her mother; Emphysema in her maternal grandfather and paternal grandfather; Heart attack in her father; Heart disease (age of onset: 23) in her father; Hypertension in her father; Leukemia in her  paternal grandmother; Lung cancer in her mother; Parkinson's  disease in her father.    ROS:  General:no colds or fevers, no weight changes Skin:no rashes or ulcers HEENT:no blurred vision, no congestion CV:see HPI PUL:see HPI GI:no diarrhea constipation or melena, no indigestion GU:no hematuria, no dysuria MS:no joint pain, no claudication Neuro:no syncope, no lightheadedness Endo:no diabetes, no thyroid disease Wt Readings from Last 3 Encounters:  11/06/18 164 lb 6.4 oz (74.6 kg)  10/19/18 167 lb (75.8 kg)  10/18/18 161 lb (73 kg)     PHYSICAL EXAM: VS:  BP 120/70    Pulse 63    Ht _0  (1.626 m)    Wt 164 lb 6.4 oz (74.6 kg)    SpO2 97%    BMI 28.22 kg/m  , BMI Body mass index is 28.22 kg/m. General:Pleasant affect, NAD Skin:Warm and dry, brisk capillary refill HEENT:normocephalic, sclera clear, mucus membranes moist Neck:supple, no JVD, no bruits  Heart:S1S2 RRR with 2-3/5 systolic murmur, no gallup, rub or click Lungs:clear without rales, rhonchi, or wheezes TIR:WERX, non tender, + BS, do not palpate liver spleen or masses Ext:no lower ext edema, Rt BKA with prosthesis 2+ pedal pulse, 2+ radial pulses Neuro:alert and oriented, MAE, follows commands, + facial symmetry    EKG:  EKG is NOT ordered today.   Recent Labs: 07/07/2018: ALT 28 10/13/2018: BUN 18; Creatinine, Ser 0.98; Hemoglobin 9.5; Magnesium 1.8; NT-Pro BNP 2,908; Platelets 387; Potassium 4.3; Sodium 137 10/18/2018: TSH 0.474    Lipid Panel    Component Value Date/Time   CHOL 118 10/18/2018 1135   TRIG 152 (H) 10/18/2018 1135   HDL 38 (L) 10/18/2018 1135   CHOLHDL 3.1 10/18/2018 1135   CHOLHDL 5.8 (H) 02/05/2016 1047   VLDL 76 (H) 02/05/2016 1047   LDLCALC 50 10/18/2018 1135   LDLDIRECT 97 01/06/2017 0904       Other studies Reviewed: Additional studies/ records that were reviewed today include: .  Echo 05/16/18 Study Conclusions  - Left ventricle: The cavity size was normal. Wall thickness was   increased in a pattern of severe LVH. Systolic function  was   normal. The estimated ejection fraction was in the range of 60%   to 65%. Wall motion was normal; there were no regional wall   motion abnormalities. There was an increased relative   contribution of atrial contraction to ventricular filling.   Doppler parameters are consistent with abnormal left ventricular   relaxation (grade 1 diastolic dysfunction). Doppler parameters   are consistent with high ventricular filling pressure. - Aortic valve: Valve mobility was restricted. There was mild to   moderate stenosis. Mean gradient (S): 17 mm Hg. VTI ratio of LVOT   to aortic valve: 0.36. Valve area (VTI): 1.37 cm^2. Valve area   (Vmax): 1.22 cm^2. Valve area (Vmean): 1.27 cm^2. - Mitral valve: Calcified annulus. Valve area by pressure   half-time: 2.39 cm^2. - Left atrium: The atrium was moderately dilated. - Pulmonary arteries: Systolic pressure could not be accurately   estimated.  Echo 06/2018 through [facility] Interpretation Summary A complete two-dimensional transthoracic echocardiogram with color flow Doppler and spectral Doppler was performed. There is no comparison study available.  The left ventricle is normal in size. Proximal septal thickening is noted. The left ventricular ejection fraction is normal (55-60%). The left ventricular wall motion is normal. Grade I mild diastolic dysfunction; abnormal relaxation pattern. The right ventricle is grossly normal in size and function. The left and right atria are normal size.  The mitral valve leaflets appear calcified, but not stenotic. Estimation of right ventricular systolic pressure is not possible. There is heavy calcification of the aortic valve. By gradients, patient falls into category of mild aortic stenosis. Visually and by VTI, likely more in the moderate range. The IVC is normal in size.  Left Ventricle The left ventricle is normal in size. Proximal septal thickening is noted. The left ventricular ejection fraction  is normal (55-60%). The left ventricular wall motion is normal. Grade I mild diastolic dysfunction; abnormal relaxation pattern.   Right Ventricle The right ventricle is grossly normal in size and function.  Atria The left and right atria are normal size. The IVC is normal in size.  Mitral Valve There is severe mitral annular calcification. The mitral valve leaflets appear thickened, but open well. The mitral valve leaflets appear calcified, but not stenotic. There is trace mitral regurgitation.   Tricuspid Valve The tricuspid valve leaflets are thin and pliable and the valve motion is normal. There is trace tricuspid regurgitation. Estimation of right ventricular systolic pressure is not possible.  Aortic Valve There is heavy calcification of the aortic valve. By gradients, patient falls into category of mild aortic stenosis. Visually and by VTI, likely more in the moderate range. There is trace aortic regurgitation present.  Pulmonic Valve The pulmonic valve is not well visualized. There is no pulmonic regurgitation.  Vessels The aortic root is normal in diameter. The pulmonary artery is not well Visualized.  Nuc study 10/19/18 Study Highlights    Nuclear stress EF: 57%.  There was no ST segment deviation noted during stress.  This is a low risk study.  The left ventricular ejection fraction is normal (55-65%).   Low risk stress test with no evidence of ischemia or infarction    03/06/15: LHC/PCI Left Anterior Descending  Prox LAD to Mid LAD lesion 40% stenosed  calcified diffuse.  Left Circumflex  Mid Cx lesion 80% stenosed  calcified.  Second Obtuse Marginal Branch  Ost 2nd Mrg to 2nd Mrg lesion 70% stenosed  calcified diffuse.  Right Coronary Artery  Collaterals  Dist RCA filled by collaterals from Dist LAD.    Prox RCA lesion 100% stenosed  calcified diffuse.  Intervention   Mid Cx lesion  Angioplasty  Pre-stent angioplasty was  performed. A drug-eluting stent was placed. A post-stent angioplasty was not performed. Maximum pressure: 16 atm. The pre-interventional distal flow is normal (TIMI 3). The post-interventional distal flow is normal (TIMI 3). The intervention was successful. No complications occured at this lesion. IC nitroglycerin was given. The mid circumflex/obtuse marginal bifurcation is treated. There is complex bifurcation stenosis present. I planned on using a bifurcation stenting technique. Angiomax is used for anticoagulation. The patient is adequately preloaded with aspirin and Plavix. A 7 French XB 3.5 cm guide catheter was utilized. Once a therapeutic ACT was achieved, a cougar guidewire is advanced into the OM. A second cougar guidewire is advanced into the distal circumflex. The obtuse marginal branches predilated with a 2.5 x 20 mm noncompliant balloon. The circumflex is then predilated with a 3.0 mm balloon. With the balloon present in the circumflex, a 2.5 x 20 mm Promus DES his advanced into the OM branch. The stent is deployed so that the ostium is covered. Simultaneously, the circumflex balloon is dilated in order to keep the circumflex portion patent. The circumflex stent is then advanced so that it lays across the OM origin. A 3.5 x 12 mm Promus DES was chosen. This  stent is deployed at 14 atm. I tried to rewire the OM branch but was unsuccessful. I decided to post dilate the circumflex stent in order to open the cells better. A 4.0 mm noncompliant balloon is chosen. It is dilated to 16 atm. I was then able to wire the obtuse marginal branch with a whisper wire. The OM stent is postdilated with a 2.5 mm noncompliant balloon. A final kissing balloon is done with the 4.0 mm balloon and the circumflex and the 2.5 mm balloon in the obtuse marginal. At the completion of the procedure there is 0% residual stenosis in the circumflex and 20% residual stenosis at the obtuse marginal origin. There is TIMI-3 flow into both  vessels.  There is a 0% residual stenosis post intervention.  Ost 2nd Mrg to 2nd Mrg lesion  Angioplasty  Pre-stent angioplasty was performed. A drug-eluting stent was placed. A post-stent angioplasty was not performed. The pre-interventional distal flow is normal (TIMI 3). The post-interventional distal flow is normal (TIMI 3). The intervention was successful. No complications occured at this lesion. IC nitroglycerin was given. See description under LCx lesion, bifurcation stenting technique utilized  There is a 20% residual stenosis post intervention.      ASSESSMENT AND PLAN:  1.  SOB and chest tightness with neg nuc study and symptoms have resolved. If she has surgery she would be mod risk for cardiac complications with AS and CAD though no ishemia on study.  eliquis would be addressed by pharmacy.  2.  CAD with hx of PCI, stable 3.  Chronic combined systolic and diastolic HF.  Improved with increased lasix and now back to lasix 20 prn, about 3 X week and K dur 10 with lasix. 4.  AS moderate on echo 05/2018 5.  PAF maintaining SR on amiodarone. On eliquis.  6.  Hypomag. Will recheck with addition of lasix.   7.  OSA on bipap and no changes.  8.  Anemia followed by her PCP along with hemorrhoids. Now on iron.  9.  Leg knots followed by PCP.  Follow up with Dr. Radford Pax in 6 months.    Current medicines are reviewed with the patient today.  The patient Has no concerns regarding medicines.  The following changes have been made:  See above Labs/ tests ordered today include:see above  Disposition:   FU:  see above  Signed, Cecilie Kicks, NP  11/06/2018 10:17 AM    [address] [place], [place], [place] Phone: [phone]; Fax: [phone]

## 2018-11-06 ENCOUNTER — Ambulatory Visit (INDEPENDENT_AMBULATORY_CARE_PROVIDER_SITE_OTHER): Payer: Medicare PPO | Admitting: Cardiology

## 2018-11-06 ENCOUNTER — Other Ambulatory Visit: Payer: Self-pay

## 2018-11-06 ENCOUNTER — Encounter: Payer: Self-pay | Admitting: Cardiology

## 2018-11-06 VITALS — BP 120/70 | HR 63 | Ht 64.0 in | Wt 164.4 lb

## 2018-11-06 DIAGNOSIS — R0789 Other chest pain: Secondary | ICD-10-CM

## 2018-11-06 DIAGNOSIS — I5042 Chronic combined systolic (congestive) and diastolic (congestive) heart failure: Secondary | ICD-10-CM | POA: Diagnosis not present

## 2018-11-06 DIAGNOSIS — I25118 Atherosclerotic heart disease of native coronary artery with other forms of angina pectoris: Secondary | ICD-10-CM

## 2018-11-06 DIAGNOSIS — I350 Nonrheumatic aortic (valve) stenosis: Secondary | ICD-10-CM | POA: Diagnosis not present

## 2018-11-06 DIAGNOSIS — Z79899 Other long term (current) drug therapy: Secondary | ICD-10-CM | POA: Diagnosis not present

## 2018-11-06 DIAGNOSIS — R0602 Shortness of breath: Secondary | ICD-10-CM

## 2018-11-06 DIAGNOSIS — I480 Paroxysmal atrial fibrillation: Secondary | ICD-10-CM | POA: Diagnosis not present

## 2018-11-06 LAB — BASIC METABOLIC PANEL
BUN/Creatinine Ratio: 23 (ref 12–28)
BUN: 27 mg/dL (ref 8–27)
CO2: 24 mmol/L (ref 20–29)
Calcium: 10 mg/dL (ref 8.7–10.3)
Chloride: 99 mmol/L (ref 96–106)
Creatinine, Ser: 1.17 mg/dL — ABNORMAL HIGH (ref 0.57–1.00)
GFR calc Af Amer: 56 mL/min/{1.73_m2} — ABNORMAL LOW (ref >59)
GFR calc non Af Amer: 49 mL/min/{1.73_m2} — ABNORMAL LOW (ref >59)
Glucose: 122 mg/dL — ABNORMAL HIGH (ref 65–99)
Potassium: 4.9 mmol/L (ref 3.5–5.2)
Sodium: 135 mmol/L (ref 134–144)

## 2018-11-06 LAB — MAGNESIUM: Magnesium: 2 mg/dL (ref 1.6–2.3)

## 2018-11-06 NOTE — Patient Instructions (Signed)
Medication Instructions:  Only Take Potassium with Lasix If you need a refill on your cardiac medications before your next appointment, please call your pharmacy.   Lab work:TODAY Magnesium BMP If you have labs (blood work) drawn today and your tests are completely normal, you will receive your results only by: Marland Kitchen MyChart Message (if you have MyChart) OR . A paper copy in the mail If you have any lab test that is abnormal or we need to change your treatment, we will call you to review the results.  Testing/Procedures: NONE  Follow-Up: At [facility], you and your health needs are our priority.  As part of our continuing mission to provide you with exceptional heart care, we have created designated Provider Care Teams.  These Care Teams include your primary Cardiologist (physician) and Advanced Practice Providers (APPs -  Physician Assistants and Nurse Practitioners) who all work together to provide you with the care you need, when you need it. You will need a follow up appointment in 6 months.  Please call our office 2 months in advance to schedule this appointment.  You may see Fransico Him, MD or one of the following Advanced Practice Providers on your designated Care Team:   [place], PA-C Melina Copa, PA-C . Ermalinda Barrios, PA-C  Any Other Special Instructions Will Be Listed Below (If Applicable).

## 2018-11-06 NOTE — Therapy (Signed)
[facility] [address] [place], [place], [address] Phone: [phone]   Fax:  [phone]  Physical Therapy Treatment  Patient Details  Name: Judith [name] MRN: [id number] Date of Birth: 1951-04-13 Referring Provider (PT): [name], MD   Encounter Date: 11/02/2018   CLINIC OPERATION CHANGES: [facility] is open at lower capacity following universal masking, social distancing, and patient screening.     11/02/18 1503  PT Visits / Re-Eval  Visit Number 12  Number of Visits 25  Date for PT Re-Evaluation 11/15/18  Authorization  Authorization Type [employer] Medicaid- 10th visit progress note  PT Time Calculation  PT Start Time 1500  PT Stop Time 1541  PT Time Calculation (min) 41 min  PT - End of Session  Equipment Utilized During Treatment Gait belt  Activity Tolerance Patient tolerated treatment well;No increased pain  Behavior During Therapy WFL for tasks assessed/performed      Past Medical History:  Diagnosis Date  . Abnormal EKG 07/31/2013  . Arthritis    "hands" (03/06/2015)  . Asthma   . Carpal tunnel syndrome, bilateral   . Chronic kidney disease   . Complication of anesthesia    slow to wake up. Pt sts she woke up during surgery many years ago.  . Coronary artery disease     2 v CAD with CTO of the RCA and high grade bifurcational LCx/OM stenosis. S/P PCI DES x 2 to the LCx/OM.  [place] Kitchen Diabetic peripheral neuropathy ([place]) "since 1996"  . GERD (gastroesophageal reflux disease)   . Goiter   . Headache    migraines prior to menopause  . History of shingles 06/01/2013  . Hyperlipidemia LDL goal <70 10/13/2015  . Hypertension   . Hyperthyroidism   . Osteomyelitis of foot (HCC)    Right  . PAF (paroxysmal atrial fibrillation) ([place]) 04/29/2015   CHADS2VASC score of 5 now on Apixaban  . Pneumonia ~ 1976  . Sleep apnea    Bipap  . Tremors of nervous system   . Type II diabetes  mellitus (HCC)    insulin dependent    Past Surgical History:  Procedure Laterality Date  . ABDOMINAL HYSTERECTOMY  1988   age 43; CERVICAL DYSPLASIA; ovaries intact.   . AMPUTATION Right 01/23/2016   Procedure: Right 3rd Ray Amputation;  Surgeon: [name], MD;  Location: [facility];  Service: Orthopedics;  Laterality: Right;  . AMPUTATION Right 02/13/2016   Procedure: Right Transmetatarsal Amputation;  Surgeon: [name], MD;  Location: [facility];  Service: Orthopedics;  Laterality: Right;  . AMPUTATION Right 04/15/2018   Procedure: AMPUTATION BELOW KNEE;  Surgeon: [name], MD;  Location: [facility];  Service: Orthopedics;  Laterality: Right;  . BIOPSY  04/16/2018   Procedure: BIOPSY;  Surgeon: Irene Shipper, MD;  Location: [facility] ENDOSCOPY;  Service: Endoscopy;;  . CARDIAC CATHETERIZATION N/A 02/27/2015   Procedure: Left Heart Cath and Coronary Angiography;  Surgeon: [name], MD; LAD 40%, mCFX 80%, OM 70%, RCA 100% calcified       . CARDIAC CATHETERIZATION N/A 03/06/2015   Procedure: Coronary Stent Intervention;  Surgeon: [name], MD;  Location: [facility];  Service: Cardiovascular;  Laterality: N/A;  Mid CX 3.50x12 promus DES w/ 0% resdual and Prox OM1 2.50x20 promus DES w/ 20% residual  . CARDIOVERSION    . CARDIOVERSION N/A 05/22/2018   Procedure: CARDIOVERSION;  Surgeon: Skeet Latch, MD;  Location: [facility];  Service:  Cardiovascular;  Laterality: N/A;  . CARPAL TUNNEL RELEASE Right Nov 2015  . CARPAL TUNNEL RELEASE Right 1992; 05/2014   [name]; [place]  . CESAREAN SECTION  1982; 1984  . ESOPHAGOGASTRODUODENOSCOPY (EGD) WITH PROPOFOL N/A 04/16/2018   Procedure: ESOPHAGOGASTRODUODENOSCOPY (EGD) WITH PROPOFOL;  Surgeon: Irene Shipper, MD;  Location: [facility] ENDOSCOPY;  Service: Endoscopy;  Laterality: N/A;  . FOOT NEUROMA SURGERY Bilateral 2000  . I&D EXTREMITY Left 01/06/2018   Procedure: DEBRIDEMENT ULCER LEFT FOOT;  Surgeon: [name], MD;  Location: [facility];  Service: Orthopedics;  Laterality: Left;  . KNEE ARTHROSCOPY Right ~ 2003   "meniscus repair"  . SHOULDER OPEN ROTATOR CUFF REPAIR Right 1996; 1998   "w/fracture repair"  . THYROID SURGERY  2000   "removed lots of nodules"  . TONSILLECTOMY  1976    There were no vitals filed for this visit.     11/02/18 1459  Symptoms/Limitations  Subjective Had the ultrasound done, has not got the results. Does reports having new areas, up to about 6 or more. They do still hurt, especially at night.  Pertinent History R TTA, asthma, CAD, IDDM2, neuropathy, shingles, HTN, Left non-traumatic intracerebral hemorrhage, CKD stage 3, right shoulder impingement syndrome  Limitations Lifting;Standing;Walking;House hold activities  Patient Stated Goals To use prosthesis to walk in community & home, gardening, [place]  Pain Assessment  Currently in Pain? No/denies  Pain Score 0      11/02/18 1504  Transfers  Transfers Sit to Stand;Stand to Sit  Sit to Stand 6: Modified independent (Device/Increase time);With upper extremity assist;From chair/3-in-1  Stand to Sit 6: Modified independent (Device/Increase time);With upper extremity assist;To chair/3-in-1  Ambulation/Gait  Ambulation/Gait Yes  Ambulation/Gait Assistance 5: Supervision;4: Min guard  Ambulation/Gait Assistance Details cues on posture and base of support with gait. minimal use of cane with gait outdoors  Ambulation Distance (Feet) 500 Feet (x1 in/oudoor cane; no device in session)  Assistive device Straight cane;Prosthesis;None  Gait Pattern Step-through pattern;Decreased step length - left;Decreased stance time - right;Narrow base of support  Ambulation Surface Level;Indoor;Unlevel;Outdoor;Paved;Gravel;Grass  Prosthetics  Prosthetic Care Comments  educated on use of Bag Balm to assist with decreased callous formation; pt also noted to be long on prosthetic side once correct sock ply achieved. Pt to call Bobby to have  height adjusted and see if posterior pad needs to be added. Pt educated on use of water seal bag  Current prosthetic wear tolerance (days/week)  daily  Current prosthetic wear tolerance (#hours/day)  most of awake hours  Residual limb condition  starting to get callous on distal end of limb.   Education Provided Correct ply sock adjustment  Person(s) Educated Patient  Education Method Explanation;Demonstration;Verbal cues  Education Method Verbalized understanding;Verbal cues required;Needs further instruction  Donning Prosthesis 6  Doffing Prosthesis 6          PT Short Term Goals - 10/24/18 1234      PT SHORT TERM GOAL #1   Title  Patient demonstrates proper adjustment of ply socks with limb volume changes. (All STGs Target Date 10/20/2018)    Baseline  MET 10/24/2018    Time  4    Period  Weeks    Status  Achieved    Target Date  10/20/18      PT SHORT TERM GOAL #2   Title  Patient tolerates prosthesis wear >12hrs total /day without skin issues.     Baseline  MET 10/24/2018    Time  4    Period  Weeks    Status  Achieved    Target Date  10/20/18      PT SHORT TERM GOAL #3   Title  Berg Balance >/= 40/56    Baseline  MET 10/24/2018  Merrilee Jansky 42/56    Time  4    Period  Weeks    Status  Achieved    Target Date  10/20/18      PT SHORT TERM GOAL #4   Title  Patient ambulates 300' with cane or less & prosthesis with supervision.     Baseline  MET 10/24/2018    Time  4    Period  Weeks    Status  Achieved    Target Date  10/20/18      PT SHORT TERM GOAL #5   Title  Patient negotiates stairs with 2 rails, ramp & curb with cane or less & prosthesis with supervision.     Baseline  MET 10/24/2018    Time  4    Period  Weeks    Status  Achieved    Target Date  10/20/18        PT Long Term Goals - 09/14/18 2135      PT LONG TERM GOAL #1   Title  Patient verbalizes & demonstrates understanding of prosthetic care to enable safe use of prosthesis (All LTGs Target Date  11/15/2018)    Time  12    Period  Weeks    Status  On-going    Target Date  11/15/18      PT LONG TERM GOAL #2   Title  Patient tolerates prosthesis wear >90% of awake hours without skin or pain issues to enable function throughout her day.     Time  12    Period  Weeks    Status  On-going    Target Date  11/15/18      PT LONG TERM GOAL #3   Title  Berg Balance >/= 45/56 to indicate lower fall risk.     Time  12    Period  Weeks    Status  On-going    Target Date  11/15/18      PT LONG TERM GOAL #4   Title  Patient ambulates >500' outdoors with LRAD modified independent to enable safe function in community.     Time  12    Period  Weeks    Status  On-going    Target Date  11/15/18      PT LONG TERM GOAL #5   Title  Patient negotiates stairs, ramps & curbs with LRAD & Prosthesis modified independent to enable community access.     Time  12    Period  Weeks    Status  On-going    Target Date  11/15/18      PT LONG TERM GOAL #6   Title  Patient ambulates 81' around furniture with cane or less & prosthesis carrying household items modified independent for household function.     Time  12    Period  Weeks    Status  On-going    Target Date  11/15/18         11/02/18 1504  Plan  Clinical Impression Statement Increased time today spent on getting correct fit of prosthesis with socks and then height check. Pt to call Bobby to have height adjusted. Remainder of session addressed gait on various surfaces with no balance issues noted The pt  is progressing toward goals and should benefit from contiued PT to progress toward unmet goals.  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)  Pt will benefit from skilled therapeutic intervention in order to improve on the following deficits Abnormal gait;Decreased activity tolerance;Decreased balance;Decreased endurance;Decreased knowledge of use of DME;Decreased mobility;Decreased range of motion;Decreased strength;Dizziness;Increased edema;Postural dysfunction;Prosthetic Dependency;Pain  Stability/Clinical Decision Making Evolving/Moderate complexity  Rehab Potential Good  PT Frequency 2x / week  PT Duration 12 weeks  PT Treatment/Interventions ADLs/Self Care Home Management;Canalith Repostioning;Moist Heat;DME Instruction;Gait training;Stair training;Functional mobility training;Therapeutic activities;Therapeutic exercise;Balance training;Neuromuscular re-education;Patient/family education;Prosthetic Training;Taping;Vestibular  PT Next Visit Plan balance training to facilitate ankle/residual limb, hip & step strategies. prosthetic gait with cane for community /outdoors & no device indoors including scanning, carrying items & negotiating barriers.  Consulted and Agree with Plan of Care Patient          Patient will benefit from skilled therapeutic intervention in order to improve the following deficits and impairments:  Abnormal gait, Decreased activity tolerance, Decreased balance, Decreased endurance, Decreased knowledge of use of DME, Decreased mobility, Decreased range of motion, Decreased strength, Dizziness, Increased edema, Postural dysfunction, Prosthetic Dependency, Pain  Visit Diagnosis: 1. Unsteadiness on feet   2. Muscle weakness (generalized)   3. Other abnormalities of gait and mobility   4. Abnormal posture        Problem List Patient Active Problem List   Diagnosis Date Noted  . Diarrhea 09/05/2018  . Urinary tract infection 06/30/2018  . ICH (intracerebral hemorrhage) ([employer]) 06/27/2018  .  DKA (diabetic ketoacidoses) ([place]) 06/26/2018  . V-tach ([place]) 05/14/2018  . Seizure ([place]) 05/14/2018  . Slow transit constipation   . Fall   . Labile blood glucose   . Labile blood pressure   . Chronic combined systolic and diastolic congestive heart failure ([place])   . Chronic diastolic congestive heart failure ([place])   . CKD (chronic kidney disease), stage III ([place])   . Type 2 diabetes mellitus with peripheral neuropathy (HCC)   . Amputation of right lower extremity below knee ([place]) 04/20/2018  . Unilateral traumatic amputation of right leg below knee with complication, initial encounter ([place])   . Post-operative pain   . PAF (paroxysmal atrial fibrillation) ([place])   . Duodenal ulcer   . Atrial fibrillation with RVR ([place]) 04/14/2018  . Foot osteomyelitis, right ([place]) 04/14/2018  . Cutaneous abscess of left foot   . Acute respiratory failure with hypoxia ([place])   . Cellulitis 01/02/2018  . LGI bleed   . Acute blood loss anemia   . Wide-complex tachycardia ([place]) 07/04/2017  . Type II diabetes mellitus, uncontrolled ([place]) 07/04/2017  . Peripheral neuropathy 07/04/2017  . H/O hyperthyroidism 07/04/2017  . Atrial fibrillation with rapid ventricular response ([place]) 08/06/2016  . S/P transmetatarsal amputation of foot, right ([place]) 04/19/2016  . Obstructive sleep apnea 11/26/2015  . Bilateral carpal tunnel syndrome 11/26/2015  . Hypomagnesemia 11/16/2015  . Hyperlipidemia LDL goal <70 10/13/2015  . Abnormality of gait 09/02/2015  . Memory loss 08/12/2015  . Diabetic peripheral neuropathy ([place]) 08/12/2015  . Vitamin D deficiency 08/12/2015  . Hyperthyroidism 04/30/2015  . Heme positive stool   . Persistent atrial fibrillation 04/29/2015  . Coronary artery disease with stable angina pectoris ([place]) 03/29/2015  . Abnormal nuclear stress test   .  History of goiter 09/28/2014  . GERD (gastroesophageal reflux disease) 07/31/2013  . Depression with anxiety 06/01/2013  . DM (diabetes mellitus),  type 2 with peripheral vascular complications ([place]) 65/46/1243  . HTN (hypertension) 01/30/2013    Willow Ora, PTA, [facility] [address], [place], [place] [address] [phone] 11/06/18, 9:10 AM   Name: Judith [name] MRN: [id number] Date of Birth: [birth year]

## 2018-11-06 NOTE — Telephone Encounter (Signed)
Results reviewed and sent via mychart

## 2018-11-07 ENCOUNTER — Encounter: Payer: Self-pay | Admitting: Physical Therapy

## 2018-11-07 ENCOUNTER — Ambulatory Visit: Payer: Medicare PPO | Admitting: Physical Therapy

## 2018-11-07 DIAGNOSIS — M79661 Pain in right lower leg: Secondary | ICD-10-CM | POA: Diagnosis not present

## 2018-11-07 DIAGNOSIS — R2681 Unsteadiness on feet: Secondary | ICD-10-CM

## 2018-11-07 DIAGNOSIS — R293 Abnormal posture: Secondary | ICD-10-CM

## 2018-11-07 DIAGNOSIS — R2689 Other abnormalities of gait and mobility: Secondary | ICD-10-CM | POA: Diagnosis not present

## 2018-11-07 DIAGNOSIS — M6281 Muscle weakness (generalized): Secondary | ICD-10-CM

## 2018-11-07 NOTE — Therapy (Signed)
[facility] [address] [place], [place], [address] Phone: [phone]   Fax:  [phone]  Physical Therapy Treatment  Patient Details  Name: DERRIONA BRANSCOM MRN: [id number] Date of Birth: [birth year] Referring Provider (PT): [name], MD   Encounter Date: 11/07/2018   CLINIC OPERATION CHANGES: [facility] is open at lower capacity following universal masking, social distancing, and patient screening.   PT End of Session - 11/07/18 1108    Visit Number  13    Number of Visits  25    Date for PT Re-Evaluation  11/15/18    Authorization Type  [employer] & Medicaid- 10th visit progress note    PT Start Time  1100    PT Stop Time  1142    PT Time Calculation (min)  42 min    Equipment Utilized During Treatment  Gait belt    Activity Tolerance  Patient tolerated treatment well;No increased pain    Behavior During Therapy  WFL for tasks assessed/performed       Past Medical History:  Diagnosis Date  . Abnormal EKG 07/31/2013  . Arthritis    "hands" (03/06/2015)  . Asthma   . Carpal tunnel syndrome, bilateral   . Chronic kidney disease   . Complication of anesthesia    slow to wake up. Pt sts she woke up during surgery many years ago.  . Coronary artery disease     2 v CAD with CTO of the RCA and high grade bifurcational LCx/OM stenosis. S/P PCI DES x 2 to the LCx/OM.  Marland Kitchen Diabetic peripheral neuropathy ([place]) "since 1996"  . GERD (gastroesophageal reflux disease)   . Goiter   . Headache    migraines prior to menopause  . History of shingles 06/01/2013  . Hyperlipidemia LDL goal <70 10/13/2015  . Hypertension   . Hyperthyroidism   . Osteomyelitis of foot (HCC)    Right  . PAF (paroxysmal atrial fibrillation) ([place]) 04/29/2015   CHADS2VASC score of 5 now on Apixaban  . Pneumonia ~ 1976  . Sleep apnea    Bipap  . Tremors of nervous system   . Type II diabetes mellitus (HCC)    insulin  dependent    Past Surgical History:  Procedure Laterality Date  . ABDOMINAL HYSTERECTOMY  1988   age 62; CERVICAL DYSPLASIA; ovaries intact.   . AMPUTATION Right 01/23/2016   Procedure: Right 3rd Ray Amputation;  Surgeon: [name], MD;  Location: [facility];  Service: Orthopedics;  Laterality: Right;  . AMPUTATION Right 02/13/2016   Procedure: Right Transmetatarsal Amputation;  Surgeon: [name], MD;  Location: [facility];  Service: Orthopedics;  Laterality: Right;  . AMPUTATION Right 04/15/2018   Procedure: AMPUTATION BELOW KNEE;  Surgeon: [name], MD;  Location: [facility];  Service: Orthopedics;  Laterality: Right;  . BIOPSY  04/16/2018   Procedure: BIOPSY;  Surgeon: Irene Shipper, MD;  Location: [facility] ENDOSCOPY;  Service: Endoscopy;;  . CARDIAC CATHETERIZATION N/A 02/27/2015   Procedure: Left Heart Cath and Coronary Angiography;  Surgeon: [name], MD; LAD 40%, mCFX 80%, OM 70%, RCA 100% calcified       . CARDIAC CATHETERIZATION N/A 03/06/2015   Procedure: Coronary Stent Intervention;  Surgeon: [name], MD;  Location: [facility];  Service: Cardiovascular;  Laterality: N/A;  Mid CX 3.50x12 promus DES w/ 0% resdual and Prox OM1 2.50x20 promus DES w/ 20% residual  . CARDIOVERSION    . CARDIOVERSION  N/A 05/22/2018   Procedure: CARDIOVERSION;  Surgeon: Skeet Latch, MD;  Location: [facility];  Service: Cardiovascular;  Laterality: N/A;  . CARPAL TUNNEL RELEASE Right Nov 2015  . CARPAL TUNNEL RELEASE Right 1992; 05/2014   [name]; [place]  . CESAREAN SECTION  1982; 1984  . ESOPHAGOGASTRODUODENOSCOPY (EGD) WITH PROPOFOL N/A 04/16/2018   Procedure: ESOPHAGOGASTRODUODENOSCOPY (EGD) WITH PROPOFOL;  Surgeon: Irene Shipper, MD;  Location: [facility] ENDOSCOPY;  Service: Endoscopy;  Laterality: N/A;  . FOOT NEUROMA SURGERY Bilateral 2000  . I&D EXTREMITY Left 01/06/2018   Procedure: DEBRIDEMENT ULCER LEFT FOOT;  Surgeon: [name], MD;  Location: [facility];  Service: Orthopedics;   Laterality: Left;  . KNEE ARTHROSCOPY Right ~ 2003   "meniscus repair"  . SHOULDER OPEN ROTATOR CUFF REPAIR Right 1996; 1998   "w/fracture repair"  . THYROID SURGERY  2000   "removed lots of nodules"  . TONSILLECTOMY  1976    There were no vitals filed for this visit.  Subjective Assessment - 11/07/18 1104    Subjective  Reports they do not know what the knots are in her leg. Has been cleared by Cardiology for shoulder surgery, now waiting on other doctors. No falls. Noticed pain at her left elbow area, does have a bruise, does not recall hitting it on anything.Reports that they plan to remove the nodules when she has her shoulder surgery. Discussed if she will be able to bear weight on limb after this procedure as her shoulder will be NWB as well. She also has not made the MD aware that there are more of these nodules and they are now on her left LE.    Pertinent History  R TTA, asthma, CAD, IDDM2, neuropathy, shingles, HTN, Left non-traumatic intracerebral hemorrhage, CKD stage 3, right shoulder impingement syndrome    Limitations  Lifting;Standing;Walking;House hold activities    Patient Stated Goals  To use prosthesis to walk in community & home, gardening, [place]    Currently in Pain?  Yes    Pain Score  3     Pain Location  Arm   just above elbow   Pain Orientation  Left;Posterior    Pain Descriptors / Indicators  Sore;Tender    Pain Type  Acute pain    Pain Onset  Yesterday    Pain Frequency  Constant    Aggravating Factors   certain movements    Pain Relieving Factors  rest           [facility] Adult PT Treatment/Exercise - 11/07/18 1112      Transfers   Transfers  Sit to Stand;Stand to Sit    Sit to Stand  6: Modified independent (Device/Increase time);With upper extremity assist;From chair/3-in-1    Stand to Sit  6: Modified independent (Device/Increase time);With upper extremity assist;To chair/3-in-1      Ambulation/Gait   Ambulation/Gait  Yes     Ambulation/Gait Assistance  5: Supervision;4: Min guard    Ambulation Distance (Feet)  800 Feet   x1 in/out with cane, around gym no AD   Assistive device  Straight cane;Prosthesis;None    Gait Pattern  Step-through pattern;Decreased step length - left;Decreased stance time - right;Narrow base of support    Ambulation Surface  Level;Indoor    Ramp  5: Supervision;Other (comment)   min guard assist   Ramp Details (indicate cue type and reason)  x 2 reps with decreased assist on second rep with prosthesis only      Self-Care  Self-Care  Other Self-Care Comments    Other Self-Care Comments   discussed safety and activity at hoome as pt's on her own (sister is out of town). pt keeps phone on her, discussed keeping light on at night as well and prepping meals in am when not fatigued.       Neuro Re-ed    Neuro Re-ed Details   for balance/muscle re-ed/coordination: gait around track with speed changes, scanning enviroment all directions with min guard to min assist for balance.                   Prosthetics   Prosthetic Care Comments   Saw Bobby. He added a posterior pad to the socket. Using less socks now.     Current prosthetic wear tolerance (days/week)   daily    Current prosthetic wear tolerance (#hours/day)   most of awake hours    Residual limb condition   intact. callous is getting better, using bag balm on it as suggested.     Donning Prosthesis  Modified independent (device/increased time)    Doffing Prosthesis  Modified independent (device/increased time)               PT Short Term Goals - 10/24/18 1234      PT SHORT TERM GOAL #1   Title  Patient demonstrates proper adjustment of ply socks with limb volume changes. (All STGs Target Date 10/20/2018)    Baseline  MET 10/24/2018    Time  4    Period  Weeks    Status  Achieved    Target Date  10/20/18      PT SHORT TERM GOAL #2   Title  Patient tolerates prosthesis wear >12hrs total /day without skin issues.      Baseline  MET 10/24/2018    Time  4    Period  Weeks    Status  Achieved    Target Date  10/20/18      PT SHORT TERM GOAL #3   Title  Berg Balance >/= 40/56    Baseline  MET 10/24/2018  Berg 42/56    Time  4    Period  Weeks    Status  Achieved    Target Date  10/20/18      PT SHORT TERM GOAL #4   Title  Patient ambulates 300' with cane or less & prosthesis with supervision.     Baseline  MET 10/24/2018    Time  4    Period  Weeks    Status  Achieved    Target Date  10/20/18      PT SHORT TERM GOAL #5   Title  Patient negotiates stairs with 2 rails, ramp & curb with cane or less & prosthesis with supervision.     Baseline  MET 10/24/2018    Time  4    Period  Weeks    Status  Achieved    Target Date  10/20/18        PT Long Term Goals - 09/14/18 2135      PT LONG TERM GOAL #1   Title  Patient verbalizes & demonstrates understanding of prosthetic care to enable safe use of prosthesis (All LTGs Target Date 11/15/2018)    Time  12    Period  Weeks    Status  On-going    Target Date  11/15/18      PT LONG TERM GOAL #2   Title  Patient tolerates prosthesis wear >  90% of awake hours without skin or pain issues to enable function throughout her day.     Time  12    Period  Weeks    Status  On-going    Target Date  11/15/18      PT LONG TERM GOAL #3   Title  Berg Balance >/= 45/56 to indicate lower fall risk.     Time  12    Period  Weeks    Status  On-going    Target Date  11/15/18      PT LONG TERM GOAL #4   Title  Patient ambulates >500' outdoors with LRAD modified independent to enable safe function in community.     Time  12    Period  Weeks    Status  On-going    Target Date  11/15/18      PT LONG TERM GOAL #5   Title  Patient negotiates stairs, ramps & curbs with LRAD & Prosthesis modified independent to enable community access.     Time  12    Period  Weeks    Status  On-going    Target Date  11/15/18      PT LONG TERM GOAL #6   Title  Patient ambulates  1' around furniture with cane or less & prosthesis carrying household items modified independent for household function.     Time  12    Period  Weeks    Status  On-going    Target Date  11/15/18            Plan - 11/07/18 1108    Clinical Impression Statement  Today's skilled session focused on gait outdoors with intermittent use of cane and indoors with prosthesis only. No issues reported or noted. Also addressed any safety concerns pt may have as she is staying alone with sister traveling. The pt is progressing toward goals and should benefit from continued PT to progress toward unmet goals.    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 prosthetic gait with cane for community /outdoors & no device indoors including scanning, carrying items & negotiating barriers.    Consulted and Agree with Plan of Care  Patient       Patient will benefit from skilled therapeutic intervention in order to improve the following deficits and  impairments:  Abnormal gait, Decreased activity tolerance, Decreased balance, Decreased endurance, Decreased knowledge of use of DME, Decreased mobility, Decreased range of motion, Decreased strength, Dizziness, Increased edema, Postural dysfunction, Prosthetic Dependency, Pain  Visit Diagnosis: 1. Unsteadiness on feet   2. Muscle weakness (generalized)   3. Other abnormalities of gait and mobility   4. Abnormal posture        Problem List Patient Active Problem List   Diagnosis Date Noted  . Diarrhea  09/05/2018  . Urinary tract infection 06/30/2018  . ICH (intracerebral hemorrhage) ([place]) 06/27/2018  . DKA (diabetic ketoacidoses) ([place]) 06/26/2018  . V-tach ([place]) 05/14/2018  . Seizure ([place]) 05/14/2018  . Slow transit constipation   . Fall   . Labile blood glucose   . Labile blood pressure   . Chronic combined systolic and diastolic congestive heart failure ([place])   . Chronic diastolic congestive heart failure ([place])   . CKD (chronic kidney disease), stage III ([place])   . Type 2 diabetes mellitus with peripheral neuropathy (HCC)   . Amputation of right lower extremity below knee ([place]) 04/20/2018  . 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 Post-operative pain   . PAF (paroxysmal atrial fibrillation) ([place])   . Duodenal ulcer   . Atrial fibrillation with RVR ([place]) 04/14/2018  . Foot osteomyelitis, right ([place]) 04/14/2018  . Cutaneous abscess of left foot   . Acute respiratory failure with hypoxia ([place])   . Cellulitis 01/02/2018  . LGI bleed   . Acute blood loss anemia   . Wide-complex tachycardia ([place]) 07/04/2017  . Type II diabetes mellitus, uncontrolled ([place]) 07/04/2017  . Peripheral neuropathy 07/04/2017  . H/O hyperthyroidism 07/04/2017  . Atrial fibrillation with rapid ventricular response ([place]) 08/06/2016  . S/P transmetatarsal amputation of foot, right ([place]) 04/19/2016  . Obstructive sleep apnea 11/26/2015  .  Bilateral carpal tunnel syndrome 11/26/2015  . Hypomagnesemia 11/16/2015  . Hyperlipidemia LDL goal <70 10/13/2015  . Abnormality of gait 09/02/2015  . Memory loss 08/12/2015  . Diabetic peripheral neuropathy ([place]) 08/12/2015  . Vitamin D deficiency 08/12/2015  . Hyperthyroidism 04/30/2015  . Heme positive stool   . Persistent atrial fibrillation 04/29/2015  . Coronary artery disease with stable angina pectoris ([place]) 03/29/2015  . Abnormal nuclear stress test   . History of goiter 09/28/2014  . GERD (gastroesophageal reflux disease) 07/31/2013  . Depression with anxiety 06/01/2013  . DM (diabetes mellitus), type 2 with peripheral vascular complications ([place]) 77/41/4239  . HTN (hypertension) 01/30/2013    Willow Ora, PTA, [facility] [address] [place],  [address] [phone] 11/07/18, 3:52 PM   Name: CHARITI HAVEL MRN: [id number] Date of Birth: 10/23/1950

## 2018-11-08 ENCOUNTER — Encounter: Payer: Self-pay | Admitting: *Deleted

## 2018-11-08 ENCOUNTER — Telehealth: Payer: Self-pay | Admitting: Registered Nurse

## 2018-11-08 ENCOUNTER — Telehealth: Payer: Medicare PPO | Admitting: Registered Nurse

## 2018-11-08 DIAGNOSIS — R2241 Localized swelling, mass and lump, right lower limb: Secondary | ICD-10-CM

## 2018-11-08 NOTE — Telephone Encounter (Signed)
Pt had appt today to discuss U/S results for masses on R leg. Given her anxiety surrounding the results, I decided to give her a courtesy call to discuss these results and organize next steps.  Based on results and previous notes from PCP Dr Pamella Pert, these seem like benign masses suspicious for angiolipoma or cysts. Given her pain and concern for further masses not seen on ultrasound, it would be prudent to refer her to dermatology at this time for further assessment and management.   She is due to have rotator cuff repair in the near future - she is concerned about her mobility and wants to make sure this problem is addressed as well.   She understands that I will put in a referral to dermatology today and to expect a call from their office to schedule an appt.   Kathrin Ruddy, NP

## 2018-11-09 ENCOUNTER — Encounter: Payer: Self-pay | Admitting: Physical Therapy

## 2018-11-09 ENCOUNTER — Ambulatory Visit: Payer: Medicare PPO | Attending: Orthopedic Surgery | Admitting: Physical Therapy

## 2018-11-09 ENCOUNTER — Other Ambulatory Visit: Payer: Self-pay

## 2018-11-09 ENCOUNTER — Ambulatory Visit (INDEPENDENT_AMBULATORY_CARE_PROVIDER_SITE_OTHER): Payer: Medicare PPO | Admitting: Physician Assistant

## 2018-11-09 ENCOUNTER — Encounter: Payer: Self-pay | Admitting: Physician Assistant

## 2018-11-09 ENCOUNTER — Ambulatory Visit (INDEPENDENT_AMBULATORY_CARE_PROVIDER_SITE_OTHER): Payer: Medicare PPO | Admitting: Gastroenterology

## 2018-11-09 ENCOUNTER — Encounter: Payer: Self-pay | Admitting: Gastroenterology

## 2018-11-09 VITALS — Ht 64.0 in | Wt 164.0 lb

## 2018-11-09 DIAGNOSIS — D500 Iron deficiency anemia secondary to blood loss (chronic): Secondary | ICD-10-CM | POA: Diagnosis not present

## 2018-11-09 DIAGNOSIS — M6281 Muscle weakness (generalized): Secondary | ICD-10-CM | POA: Insufficient documentation

## 2018-11-09 DIAGNOSIS — K219 Gastro-esophageal reflux disease without esophagitis: Secondary | ICD-10-CM | POA: Diagnosis not present

## 2018-11-09 DIAGNOSIS — M7541 Impingement syndrome of right shoulder: Secondary | ICD-10-CM | POA: Diagnosis not present

## 2018-11-09 DIAGNOSIS — R2689 Other abnormalities of gait and mobility: Secondary | ICD-10-CM | POA: Insufficient documentation

## 2018-11-09 DIAGNOSIS — R2681 Unsteadiness on feet: Secondary | ICD-10-CM

## 2018-11-09 DIAGNOSIS — M79661 Pain in right lower leg: Secondary | ICD-10-CM | POA: Diagnosis not present

## 2018-11-09 DIAGNOSIS — R293 Abnormal posture: Secondary | ICD-10-CM | POA: Diagnosis not present

## 2018-11-09 NOTE — Progress Notes (Signed)
Office Visit Note   Patient: Judith [name]           Date of Birth: 24-Jun-1950           MRN: [id number] Visit Date: 11/09/2018              Requested by: Rutherford Guys, MD [address] [place],  [place] [address] PCP: Rutherford Guys, MD  Chief Complaint  Patient presents with  . Left Leg - Follow-up  . Right Leg - Follow-up      HPI: The patient is a 68 year old woman who is working on getting scheduled for a right shoulder arthroscopic surgery with debridement who presents today requesting steroid injection to the shoulder in the interim until she can have surgery.  She is experiencing continued throbbing, burning and occasionally sharper pain in the right shoulder and decreased range of motion.  She is status post a transtibial amputation and has done extremely well and healed well following this and is wearing a prosthesis with her ambulation.  She has also developed some masses over the lateral aspect of both her thighs and these will possibly be excised at the time of her shoulder scope.  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  She is required an extensive preoperative work-up given her extensive medical history and has been evaluated by cardiology, but is being evaluated for anemia and is going to have to undergo a camera endoscopy as part of her preoperative work-up as well.    Assessment & Plan: Visit Diagnoses:  1. Impingement syndrome of right shoulder     Plan: After informed consent the patient underwent a steroid injection to the right shoulder under sterile techniques and tolerated this well. She will continue to undergo her preoperative work-up as noted above prior to her right shoulder surgery and possible excision of bilateral lower extremity lipoma at the same time.  We will plan to see her back here in about 4 weeks  Follow-Up Instructions: Return in about 4 weeks (around 12/07/2018).   Ortho Exam  Patient is alert, oriented, no adenopathy, well-dressed, normal  affect, normal respiratory effort. Patient ambulates with a right right lower extremity prosthesis and a single-point cane.  She has pain in the right shoulder with forward flexion internal and external rotation.  She is neurovascularly intact distally in the right upper extremity.  After informed consent the patient underwent steroid injection to the right shoulder under sterile techniques and tolerated this well.  Imaging: No results found. No images are attached to the encounter.  Labs: Lab Results  Component Value Date   HGBA1C 6.6 (H) 10/18/2018   HGBA1C 7.5 (A) 06/07/2018   HGBA1C 8.0 (H) 04/14/2018   ESRSEDRATE 133 (H) 01/02/2018   CRP 23.5 (H) 01/02/2018   REPTSTATUS 05/20/2018 FINAL 05/18/2018   GRAMSTAIN  01/06/2018    MODERATE WBC PRESENT, PREDOMINANTLY PMN RARE GRAM POSITIVE COCCI    CULT 20,000 COLONIES/mL SERRATIA MARCESCENS (A) 05/18/2018   LABORGA SERRATIA MARCESCENS (A) 05/18/2018     Lab Results  Component Value Date   ALBUMIN 4.0 07/07/2018   ALBUMIN 3.6 06/08/2018   ALBUMIN 4.2 05/25/2018    Lab Results  Component Value Date   MG 2.0 11/06/2018   MG 1.8 10/13/2018   MG 2.0 07/07/2018   Lab Results  Component Value Date   VD25OH 28 (L) 03/29/2015    No results found for: PREALBUMIN CBC EXTENDED Latest Ref Rng & Units 10/13/2018 07/07/2018 06/08/2018  WBC 3.4 -  10.8 x10E3/uL 9.2 8.3 5.9  RBC 3.77 - 5.28 x10E6/uL 3.32(L) 3.56(L) 4.15  HGB 11.1 - 15.9 g/dL 9.5(L) 10.5(L) 11.3(L)  HCT 34.0 - 46.6 % 29.2(L) 31.0(L) 36.0  PLT 150 - 450 x10E3/uL 387 490(H) 445(H)  NEUTROABS 1.7 - 7.7 K/uL - - -  LYMPHSABS 0.7 - 4.0 K/uL - - -     Body mass index is 28.15 kg/m.  Orders:  Orders Placed This Encounter  Procedures  . Large Joint Inj: R subacromial bursa   No orders of the defined types were placed in this encounter.    Procedures: Large Joint Inj: R subacromial bursa on 11/09/2018 11:13 AM Indications: diagnostic evaluation and pain Details: 22 G  1.5 in needle, posterior approach  Arthrogram: No  Medications: 5 mL lidocaine 1 %; 40 mg methylPREDNISolone acetate 40 MG/ML Outcome: tolerated well, no immediate complications Procedure, treatment alternatives, risks and benefits explained, specific risks discussed. Consent was given by the patient. Immediately prior to procedure a time out was called to verify the correct patient, procedure, equipment, support staff and site/side marked as required. Patient was prepped and draped in the usual sterile fashion.      Clinical Data: No additional findings.  ROS:  All other systems negative, except as noted in the HPI. Review of Systems  Objective: Vital Signs: Ht 5\' 4"  (1.626 m)   Wt 164 lb (74.4 kg)   BMI 28.15 kg/m   Specialty Comments:  No specialty comments available.  PMFS History: Patient Active Problem List   Diagnosis Date Noted  . Diarrhea 09/05/2018  . Urinary tract infection 06/30/2018  . ICH (intracerebral hemorrhage) ([place]) 06/27/2018  . DKA (diabetic ketoacidoses) ([place]) 06/26/2018  . V-tach ([place]) 05/14/2018  . Seizure ([place]) 05/14/2018  . Slow transit constipation   . Fall   . Labile blood glucose   . Labile blood pressure   . Chronic combined systolic and diastolic congestive heart failure ([place])   . Chronic diastolic congestive heart failure ([place])   . CKD (chronic kidney disease), stage III ([place])   . Type 2 diabetes mellitus with peripheral neuropathy (HCC)   . Amputation of right lower extremity below knee ([place]) 04/20/2018  . 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 Post-operative pain   . PAF (paroxysmal atrial fibrillation) ([place])   . Duodenal ulcer   . Atrial fibrillation with RVR ([place]) 04/14/2018  . Foot osteomyelitis, right ([place]) 04/14/2018  . Cutaneous abscess of left foot   . Acute respiratory failure with hypoxia ([place])   . Cellulitis 01/02/2018  . LGI bleed   . Acute blood loss  anemia   . Wide-complex tachycardia ([place]) 07/04/2017  . Type II diabetes mellitus, uncontrolled ([place]) 07/04/2017  . Peripheral neuropathy 07/04/2017  . H/O hyperthyroidism 07/04/2017  . Atrial fibrillation with rapid ventricular response ([place]) 08/06/2016  . S/P transmetatarsal amputation of foot, right ([place]) 04/19/2016  . Obstructive sleep apnea 11/26/2015  . Bilateral carpal tunnel syndrome 11/26/2015  . Hypomagnesemia 11/16/2015  . Hyperlipidemia LDL goal <70 10/13/2015  . Abnormality of gait 09/02/2015  . Memory loss 08/12/2015  . Diabetic peripheral neuropathy ([place]) 08/12/2015  . Vitamin D deficiency 08/12/2015  . Hyperthyroidism 04/30/2015  . Heme positive stool   . Persistent atrial fibrillation 04/29/2015  . Coronary artery disease with stable angina pectoris ([place]) 03/29/2015  . Abnormal nuclear stress test   . History of goiter 09/28/2014  . GERD (gastroesophageal reflux disease) 07/31/2013  .  Depression with anxiety 06/01/2013  . DM (diabetes mellitus), type 2 with peripheral vascular complications ([place]) 70/62/3762  . HTN (hypertension) 01/30/2013   Past Medical History:  Diagnosis Date  . Abnormal EKG 07/31/2013  . Arthritis    "hands" (03/06/2015)  . Asthma   . Carpal tunnel syndrome, bilateral   . Chronic kidney disease   . Complication of anesthesia    slow to wake up. Pt sts she woke up during surgery many years ago.  . Coronary artery disease     2 v CAD with CTO of the RCA and high grade bifurcational LCx/OM stenosis. S/P PCI DES x 2 to the LCx/OM.  Marland Kitchen Diabetic peripheral neuropathy ([place]) "since 1996"  . GERD (gastroesophageal reflux disease)   . Goiter   . Headache    migraines prior to menopause  . History of shingles 06/01/2013  . Hyperlipidemia LDL goal <70 10/13/2015  . Hypertension   . Hyperthyroidism   . Osteomyelitis of foot (HCC)    Right  . PAF (paroxysmal atrial fibrillation) ([place]) 04/29/2015   CHADS2VASC score of 5 now on Apixaban  .  Pneumonia ~ 1976  . Sleep apnea    Bipap  . Tremors of nervous system   . Type II diabetes mellitus (HCC)    insulin dependent    Family History  Problem Relation Age of Onset  . Cancer Mother 1       bronchial cancer  . Breast cancer Mother   . Lung cancer Mother   . Hypertension Father   . COPD Father   . Heart disease Father 46       CAD with cardiac stenting  . Heart attack Father   . Parkinson's disease Father   . Allergies Sister   . Breast cancer Maternal Grandmother   . Emphysema Maternal Grandfather   . Leukemia Paternal Grandmother   . Emphysema Paternal Grandfather   . Thyroid disease Neg Hx     Past Surgical History:  Procedure Laterality Date  . ABDOMINAL HYSTERECTOMY  1988   age 34; CERVICAL DYSPLASIA; ovaries intact.   . AMPUTATION Right 01/23/2016   Procedure: Right 3rd Ray Amputation;  Surgeon: [name], MD;  Location: [facility];  Service: Orthopedics;  Laterality: Right;  . AMPUTATION Right 02/13/2016   Procedure: Right Transmetatarsal Amputation;  Surgeon: [name], MD;  Location: [facility];  Service: Orthopedics;  Laterality: Right;  . AMPUTATION Right 04/15/2018   Procedure: AMPUTATION BELOW KNEE;  Surgeon: [name], MD;  Location: [facility];  Service: Orthopedics;  Laterality: Right;  . BIOPSY  04/16/2018   Procedure: BIOPSY;  Surgeon: Irene Shipper, MD;  Location: [facility]COPY;  Service: Endoscopy;;  . CARDIAC CATHETERIZATION N/A 02/27/2015   Procedure: Left Heart Cath and Coronary Angiography;  Surgeon: [name], MD; LAD 40%, mCFX 80%, OM 70%, RCA 100% calcified       . CARDIAC CATHETERIZATION N/A 03/06/2015   Procedure: Coronary Stent Intervention;  Surgeon: [name], MD;  Location: [facility] CV LAB;  Service: Cardiovascular;  Laterality: N/A;  Mid CX 3.50x12 promus DES w/ 0% resdual and Prox OM1 2.50x20 promus DES w/ 20% residual  . CARDIOVERSION    . CARDIOVERSION N/A 05/22/2018   Procedure: CARDIOVERSION;  Surgeon: Skeet Latch, MD;  Location: [facility];  Service: Cardiovascular;  Laterality: N/A;  . CARPAL TUNNEL RELEASE Right Nov 2015  . CARPAL TUNNEL RELEASE Right 1992; 05/2014   [name]; [place]  . CESAREAN SECTION  1982; 1984  . ESOPHAGOGASTRODUODENOSCOPY (EGD) WITH PROPOFOL N/A 04/16/2018   Procedure: ESOPHAGOGASTRODUODENOSCOPY (EGD) WITH PROPOFOL;  Surgeon: Irene Shipper, MD;  Location[facility] ENDOSCOPY;  Service: Endoscopy;  Laterality: N/A;  . FOOT NEUROMA SURGERY Bilateral 2000  . I&D EXTREMITY Left 01/06/2018   Procedure: DEBRIDEMENT ULCER LEFT FOOT;  Surgeon: [name], MD;  Location: [facility];  Service: Orthopedics;  Laterality: Left;  . KNEE ARTHROSCOPY Right ~ 2003   "meniscus repair"  . SHOULDER OPEN ROTATOR CUFF REPAIR Right 1996; 1998   "w/fracture repair"  . THYROID SURGERY  2000   "removed lots of nodules"  . TONSILLECTOMY  1976   Social History   Occupational History  . Occupation: Landscape architect  Tobacco Use  . Smoking status: Former Smoker    Packs/day: 0.00    Years: 41.00    Pack years: 0.00    Types: Cigarettes    Quit date: 03/05/2015    Years since quitting: 3.6  . Smokeless tobacco: Never Used  . Tobacco comment: 04/29/2015 "quit smoking cigarettes 02/27/2015"  Substance and Sexual Activity  . Alcohol use: No  . Drug use: No  . Sexual activity: Not Currently    Birth control/protection: Post-menopausal, Surgical

## 2018-11-09 NOTE — Patient Instructions (Addendum)
Small bowel video capsule     CAPSOCAM CAPSULE ENDOSCOPY PATIENT INSTRUCTION SHEET  Bekah C Lippold 1951-04-28 [id number]   1. 7/10  Seven (7) days prior to capsule endoscopy stop taking iron supplements and carafate.  2. 7/15 Two (2) days prior to capsule endoscopy stop taking aspirin or any arthritis drugs.  3. 7/16 Day before capsule endoscopy purchase a 238 gram bottle of Miralax from the laxative section of your drug store, and a 32 oz. bottle of Gatorade (no red).    4. 7/16  One (1) day prior to capsule endoscopy: a) Stop smoking. b) Eat a regular diet until 12:00 Noon. c) After 12:00 Noon take only the following: Black coffee  Jell-O (no fruit or red Jell-o) Water   Bouillon (chicken or beef) 7-Up   Cranberry Juice Tea   Kool-Aid Popsicle (not red) Sprite   Coke Ginger Ale  Pepsi Mountain Dew Gatorade d) At 6:00 pm the evening before your appointment, drink 7 capfuls (105 grams) of Miralax with 32 oz. Gatorade. Drink 8 oz every 15 minutes until gone. e) Nothing to eat or drink after midnight except medications with a sip of water.  5. 7/17  Day of capsule endoscopy:            Do not have anything to eat or drink after midnight No medications for 2 hours prior to your test.  6. Please arrive at [facility]  3rd floor patient registration area by 8:15am  on: 11/24/2018.   For any questions: Call [facility] at [phone] and ask to speak with one of the capsule endoscopy nurses.      The above instructions have been reviewed and explained to me by________________   Patient signature:_________________________________________     Date:________________        What you should know.   You have been scheduled for a Capsule Endoscopy with Capsocam.  You will swallow a vitamin size pill that contains cameras that will take pictures of your small intestine (bowel).  Your small bowel connects to your stomach on one end and the large intestine or bowel on  the other. The capsule moves through the gastrointestinal tract taking pictures along the way. The images are stored on the capsule.  1-5 days after the capsule ingestion you will retrieve the capsule and mail it in a prepaid envelope to [employer] to download the images.  Your physician will be provided a copy of the images to review.   Who should have a capsule endoscopy?  You may need a small bowel capsule endoscopy if you have symptoms, such as blood in your stool, chronic pain, diarrhea, or unexplained anemia. The small intestine is a hollow organ that cannot be easily visualized with a scope due to its length.  The capsule endoscopy allows your physician to directly visualize the intestine.  The pictures may show intestine growths, inflammation, or bleeding in the small intestine.   What are the risks with a capsule endoscopy?  The pictures may not be clear or give [place] a clear cause of your symptoms The capsule may become trapped in the esophagus or intestines.  You may need surgery or endoscopic procedures to remove the capsule. You may not have a MRI until the capsule endoscopy has been retrieved.   How do I retrieve the capsule?  You will be provided a kit with step-by-step instructions for capsule retrieval and mailing.  You should expect to retrieve the capsule in 1-5 days after ingestion.  This  will depend on your individual bowel habits.

## 2018-11-09 NOTE — Progress Notes (Signed)
Judith [name]    [id number]    13-Sep-1950  Primary Care Physician:Santiago, Lilia Argue, MD  Referring Physician: Rutherford Guys, MD [address] [place],  [place] [address]  This service was provided via audio and video telemedicine (Doximity) due to [name] 19 pandemic.  Patient location: Home Provider location: Office Used 2 patient identifiers to confirm the correct person. Explained the limitations in evaluation and management via telemedicine. Patient is aware of potential medical charges for this visit.  Patient consented to this virtual visit.  The persons participating in this telemedicine service were myself and the patient    Chief complaint: Iron deficiency anemia  HPI: 68 year old female with history of CAD on Plavix, A. fib on Eliquis, diabetes, CKD with chronic iron deficiency anemia GERD symptoms better with Pepcid twice daily Denies any nausea, vomiting, abdominal pain, melena or bright red blood per rectum  CBC Latest Ref Rng & Units 10/13/2018 07/07/2018 06/08/2018  WBC 3.4 - 10.8 x10E3/uL 9.2 8.3 5.9  Hemoglobin 11.1 - 15.9 g/dL 9.5(L) 10.5(L) 11.3(L)  Hematocrit 34.0 - 46.6 % 29.2(L) 31.0(L) 36.0  Platelets 150 - 450 x10E3/uL 387 490(H) 445(H)   Iron/TIBC/Ferritin/ %Sat    Component Value Date/Time   IRON 64 10/18/2018 1135   TIBC 298 10/18/2018 1135   FERRITIN 116 10/18/2018 1135   IRONPCTSAT 21 10/18/2018 1135   EGD November 05, 2016 showed esophageal candidiasis, hiatal hernia otherwise normal exam Colonoscopy November 05, 2016: Removal of sessile polyp from ascending colon, pancolonic diverticulosis and hemorrhoids. EGD April 16, 2018 by Dr. Henrene Pastor for melena showed mild esophagitis, normal stomach and multiple superficial ulcers in first part of duodenum  GI history: [facility].  colonoscopy on 12/23/2008 there was a small amount of feculent material in the proximal ascending colon. There is moderate pandiverticulosis no endoscopic  evidence of diverticulitis. In the mid sigmoid there were 2 small likely hyperplastic polyps in close proximity these were removed with cold biopsy forceps she was advised to have surveillance in 5 years. Patient had an EGD 11/27/2008 in [facility]. She was noted to have short segment Barrett's esophagus and nodular fundal gastritis. Pathology showed squamous mucosa with mild reactive epithelial changes of the type that may be seen with gastroesophageal reflux. Mild active chronic inflammation of cardiac-type gastric mucosa. No intestinal metaplasia is seen. Negative for dysplasia. She was advised to have surveillance in 2 years. Outpatient Encounter Medications as of 11/09/2018  Medication Sig  . acetaminophen (TYLENOL) 325 MG tablet Take 1-2 tablets (325-650 mg total) by mouth every 6 (six) hours as needed for mild pain (pain score 1-3 or temp > 100.5).  Marland Kitchen albuterol (PROVENTIL HFA) 108 (90 Base) MCG/ACT inhaler Inhale 1-2 puffs into the lungs every 6 (six) hours as needed for wheezing or shortness of breath.  Marland Kitchen amiodarone (PACERONE) 200 MG tablet Take 1 tablet (200 mg total) by mouth daily.  Marland Kitchen apixaban (ELIQUIS) 5 MG TABS tablet Take 1 tablet (5 mg total) by mouth 2 (two) times daily.  Marland Kitchen atorvastatin (LIPITOR) 20 MG tablet Take 1 tablet (20 mg total) by mouth daily.  Marland Kitchen buPROPion (WELLBUTRIN SR) 150 MG 12 hr tablet Take 1 tablet (150 mg total) by mouth 2 (two) times daily.  Marland Kitchen diltiazem (CARDIZEM CD) 120 MG 24 hr capsule Take 1 capsule (120 mg total) by mouth daily.  . famotidine (PEPCID) 20 MG tablet Take 1 tablet (20 mg total) by mouth 2 (two)  times daily.  . ferrous gluconate (FERGON) 324 MG tablet Take 1 tablet (324 mg total) by mouth daily with breakfast.  . FLUoxetine (PROZAC) 20 MG capsule TAKE 1 CAPSULE AT BEDTIME  . furosemide (LASIX) 20 MG tablet Take 1 tablet (20 mg total) by mouth daily.  . hydrALAZINE (APRESOLINE) 50 MG tablet Take 1 tablet (50 mg total) by mouth 3 (three)  times daily.  . insulin degludec (TRESIBA FLEXTOUCH) 100 UNIT/ML SOPN FlexTouch Pen Inject 0.16 mLs (16 Units total) into the skin daily.  . magnesium oxide (MAG-OX) 400 MG tablet Take 400 mg by mouth 2 (two) times daily.  . Melatonin 10 MG TABS Take 1 tablet by mouth at bedtime.  . metFORMIN (GLUCOPHAGE) 1000 MG tablet Take 1 tablet (1,000 mg total) by mouth daily with breakfast.  . methimazole (TAPAZOLE) 10 MG tablet TAKE 1 TABLET (10 MG TOTAL) BY MOUTH DAILY.  . metoprolol succinate (TOPROL-XL) 25 MG 24 hr tablet Take 25 mg by mouth 2 (two) times daily.  . mupirocin ointment (BACTROBAN) 2 % Apply 1 application topically 3 (three) times daily.  . nitroGLYCERIN (NITROSTAT) 0.4 MG SL tablet Place 0.4 mg under the tongue every 5 (five) minutes as needed.   . polyethylene glycol powder (GLYCOLAX/MIRALAX) 17 GM/SCOOP powder Take by mouth as needed.   . potassium chloride (K-DUR) 10 MEQ tablet Take 1 tablet (10 mEq total) by mouth daily.  Marland Kitchen Respiratory Therapy Supplies DEVI BI-PAP @@ Bedtime  . traMADol (ULTRAM) 50 MG tablet Take 50 mg by mouth every 6 (six) hours as needed.  . triamcinolone (KENALOG) 0.1 % paste APPLY TO APHTHEOUS ULCER LESION TWICE DAILY  . TRUE METRIX BLOOD GLUCOSE TEST test strip TEST BLOOD SUGAR TWICE DAILY   No facility-administered encounter medications on file as of 11/09/2018.     Allergies as of 11/09/2018 - Review Complete 11/09/2018  Allergen Reaction Noted  . Contrast media [iodinated diagnostic agents] Hives and Other (See Comments) 08/07/2017  . Novolog [insulin aspart] Shortness Of Breath and Other (See Comments) 02/27/2015  . Codeine Nausea And Vomiting and Other (See Comments) 09/28/2013  . Iodine Other (See Comments) 01/30/2013  . Penicillins Itching, Rash, and Other (See Comments) 01/30/2013  . Ace inhibitors Cough 03/13/2015  . Demerol [meperidine] Nausea And Vomiting 08/15/2015  . Dilaudid [hydromorphone hcl] Other (See Comments) 05/19/2016  . Neosporin  [neomycin-bacitracin zn-polymyx] Itching, Rash, and Other (See Comments) 01/30/2013  . Percocet [oxycodone-acetaminophen] Rash 08/15/2015  . Tape Itching and Rash 01/30/2013    Past Medical History:  Diagnosis Date  . Abnormal EKG 07/31/2013  . Arthritis    "hands" (03/06/2015)  . Asthma   . Carpal tunnel syndrome, bilateral   . Chronic kidney disease   . Complication of anesthesia    slow to wake up. Pt sts she woke up during surgery many years ago.  . Coronary artery disease     2 v CAD with CTO of the RCA and high grade bifurcational LCx/OM stenosis. S/P PCI DES x 2 to the LCx/OM.  Marland Kitchen Diabetic peripheral neuropathy ([facility]) "since 1996"  . GERD (gastroesophageal reflux disease)   . Goiter   . Headache    migraines prior to menopause  . History of shingles 06/01/2013  . Hyperlipidemia LDL goal <70 10/13/2015  . Hypertension   . Hyperthyroidism   . Osteomyelitis of foot (HCC)    Right  . PAF (paroxysmal atrial fibrillation) ([facility]) 04/29/2015   CHADS2VASC score of 5 now on Apixaban  . Pneumonia ~ 1976  .  Sleep apnea    Bipap  . Tremors of nervous system   . Type II diabetes mellitus (HCC)    insulin dependent    Past Surgical History:  Procedure Laterality Date  . ABDOMINAL HYSTERECTOMY  1988   age 53; CERVICAL DYSPLASIA; ovaries intact.   . AMPUTATION Right 01/23/2016   Procedure: Right 3rd Ray Amputation;  Surgeon: [name], MD;  Location: [facility];  Service: Orthopedics;  Laterality: Right;  . AMPUTATION Right 02/13/2016   Procedure: Right Transmetatarsal Amputation;  Surgeon: [name], MD;  Location: [facility];  Service: Orthopedics;  Laterality: Right;  . AMPUTATION Right 04/15/2018   Procedure: AMPUTATION BELOW KNEE;  Surgeon: [name], MD;  Location: [facility];  Service: Orthopedics;  Laterality: Right;  . BIOPSY  04/16/2018   Procedure: BIOPSY;  Surgeon: Irene Shipper, MD;  Location: [facility];  Service: Endoscopy;;  . CARDIAC CATHETERIZATION N/A 02/27/2015    Procedure: Left Heart Cath and Coronary Angiography;  Surgeon: [name], MD; LAD 40%, mCFX 80%, OM 70%, RCA 100% calcified       . CARDIAC CATHETERIZATION N/A 03/06/2015   Procedure: Coronary Stent Intervention;  Surgeon: [name], MD;  Location: [facility];  Service: Cardiovascular;  Laterality: N/A;  Mid CX 3.50x12 promus DES w/ 0% resdual and Prox OM1 2.50x20 promus DES w/ 20% residual  . CARDIOVERSION    . CARDIOVERSION N/A 05/22/2018   Procedure: CARDIOVERSION;  Surgeon: Skeet Latch, MD;  Location: [facility];  Service: Cardiovascular;  Laterality: N/A;  . CARPAL TUNNEL RELEASE Right Nov 2015  . CARPAL TUNNEL RELEASE Right 1992; 05/2014   [name]; [place]  . CESAREAN SECTION  1982; 1984  . ESOPHAGOGASTRODUODENOSCOPY (EGD) WITH PROPOFOL N/A 04/16/2018   Procedure: ESOPHAGOGASTRODUODENOSCOPY (EGD) WITH PROPOFOL;  Surgeon: Irene Shipper, MD;  Location: [facility];  Service: Endoscopy;  Laterality: N/A;  . FOOT NEUROMA SURGERY Bilateral 2000  . I&D EXTREMITY Left 01/06/2018   Procedure: DEBRIDEMENT ULCER LEFT FOOT;  Surgeon: [name], MD;  Location: [facility];  Service: Orthopedics;  Laterality: Left;  . KNEE ARTHROSCOPY Right ~ 2003   "meniscus repair"  . SHOULDER OPEN ROTATOR CUFF REPAIR Right 1996; 1998   "w/fracture repair"  . THYROID SURGERY  2000   "removed lots of nodules"  . TONSILLECTOMY  1976    Family History  Problem Relation Age of Onset  . Cancer Mother 45       bronchial cancer  . Breast cancer Mother   . Lung cancer Mother   . Hypertension Father   . COPD Father   . Heart disease Father 45       CAD with cardiac stenting  . Heart attack Father   . Parkinson's disease Father   . Allergies Sister   . Breast cancer Maternal Grandmother   . Emphysema Maternal Grandfather   . Leukemia Paternal Grandmother   . Emphysema Paternal Grandfather   . Thyroid disease Neg Hx     Social History   Socioeconomic History  . Marital status:  Single    Spouse name: Not on file  . Number of children: 2  . Years of education: Masters  . Highest education level: Not on file  Occupational History  . Occupation: Landscape architect  Social Needs  . Financial resource strain: Somewhat hard  . Food insecurity    Worry: Sometimes true    Inability: Sometimes true  . Transportation needs    Medical: No  Non-medical: No  Tobacco Use  . Smoking status: Former Smoker    Packs/day: 0.00    Years: 41.00    Pack years: 0.00    Types: Cigarettes    Quit date: 03/05/2015    Years since quitting: 3.6  . Smokeless tobacco: Never Used  . Tobacco comment: 04/29/2015 "quit smoking cigarettes 02/27/2015"  Substance and Sexual Activity  . Alcohol use: No  . Drug use: No  . Sexual activity: Not Currently    Birth control/protection: Post-menopausal, Surgical  Lifestyle  . Physical activity    Days per week: 0 days    Minutes per session: 0 min  . Stress: Very much  Relationships  . Social connections    Talks on phone: More than three times a week    Gets together: More than three times a week    Attends religious service: More than 4 times per year    Active member of club or organization: No    Attends meetings of clubs or organizations: Never    Relationship status: Never married  . Intimate partner violence    Fear of current or ex partner: No    Emotionally abused: No    Physically abused: No    Forced sexual activity: No  Other Topics Concern  . 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      Lives with sister in [place].      Tobacco: 1 ppd x 41 years - quit 2016      Alcohol: none      Drugs: none      Exercise:  Walking in neighborhood; physical job.   Right-handed.   2 cups caffeine  daily.      Review of systems: Review of Systems as per HPI All other systems reviewed and are negative.   Physical Exam: Vitals were not taken and physical exam was not performed during this virtual visit.  Data Reviewed:  Reviewed labs, radiology imaging, old records and pertinent past GI work up   Assessment and Plan/Recommendations:  68 year old female with history of CAD on Plavix, A. fib on Eliquis, CKD, chronic iron deficiency anemia with drop in hemoglobin and fecal Hemoccult positive. Patient never had capsule endoscopy, will proceed with it to exclude potential small bowel lesion or AVMs The risks and benefits as well as alternatives of endoscopic procedure(s) have been discussed and reviewed. All questions answered. The patient agrees to proceed. GERD continue Pepcid and antireflux measures     K. Denzil Magnuson , MD   CC: Rutherford Guys, MD

## 2018-11-10 NOTE — Therapy (Signed)
[facility] [address] [place], [place], [address] Phone: [phone]   Fax:  [phone]  Physical Therapy Treatment  Patient Details  Name: Judith [name] MRN: [id number] Date of Birth: 31-Jul-1950 Referring Provider (PT): [name], MD   Encounter Date: 11/09/2018   CLINIC OPERATION CHANGES: [facility] is open at lower capacity following universal masking, social distancing, and patient screening.  The patient's COVID risk of complications score is 5.   PT End of Session - 11/09/18 1546    Visit Number  14    Number of Visits  25    Date for PT Re-Evaluation  11/15/18    Authorization Type  [employer] & Medicaid- 10th visit progress note    PT Start Time  1500    PT Stop Time  1544    PT Time Calculation (min)  44 min    Equipment Utilized During Treatment  Gait belt    Activity Tolerance  Patient tolerated treatment well;No increased pain    Behavior During Therapy  WFL for tasks assessed/performed       Past Medical History:  Diagnosis Date  . Abnormal EKG 07/31/2013  . Arthritis    "hands" (03/06/2015)  . Asthma   . Carpal tunnel syndrome, bilateral   . Chronic kidney disease   . Complication of anesthesia    slow to wake up. Pt sts she woke up during surgery many years ago.  . Coronary artery disease     2 v CAD with CTO of the RCA and high grade bifurcational LCx/OM stenosis. S/P PCI DES x 2 to the LCx/OM.  Marland Kitchen Diabetic peripheral neuropathy ([place]) "since 1996"  . GERD (gastroesophageal reflux disease)   . Goiter   . Headache    migraines prior to menopause  . History of shingles 06/01/2013  . Hyperlipidemia LDL goal <70 10/13/2015  . Hypertension   . Hyperthyroidism   . Osteomyelitis of foot (HCC)    Right  . PAF (paroxysmal atrial fibrillation) ([place]) 04/29/2015   CHADS2VASC score of 5 now on Apixaban  . Pneumonia ~ 1976  . Sleep apnea    Bipap  . Tremors of nervous system    . Type II diabetes mellitus (HCC)    insulin dependent    Past Surgical History:  Procedure Laterality Date  . ABDOMINAL HYSTERECTOMY  1988   age 40; CERVICAL DYSPLASIA; ovaries intact.   . AMPUTATION Right 01/23/2016   Procedure: Right 3rd Ray Amputation;  Surgeon: [name], MD;  Location: [facility];  Service: Orthopedics;  Laterality: Right;  . AMPUTATION Right 02/13/2016   Procedure: Right Transmetatarsal Amputation;  Surgeon: [name], MD;  Location: [facility];  Service: Orthopedics;  Laterality: Right;  . AMPUTATION Right 04/15/2018   Procedure: AMPUTATION BELOW KNEE;  Surgeon: [name], MD;  Location: [facility];  Service: Orthopedics;  Laterality: Right;  . BIOPSY  04/16/2018   Procedure: BIOPSY;  Surgeon: Irene Shipper, MD;  Location: [facility] ENDOSCOPY;  Service: Endoscopy;;  . CARDIAC CATHETERIZATION N/A 02/27/2015   Procedure: Left Heart Cath and Coronary Angiography;  Surgeon: [name], MD; LAD 40%, mCFX 80%, OM 70%, RCA 100% calcified       . CARDIAC CATHETERIZATION N/A 03/06/2015   Procedure: Coronary Stent Intervention;  Surgeon: [name], MD;  Location: [facility];  Service: Cardiovascular;  Laterality: N/A;  Mid CX 3.50x12 promus DES w/ 0% resdual and Prox OM1 2.50x20 promus DES w/  20% residual  . CARDIOVERSION    . CARDIOVERSION N/A 05/22/2018   Procedure: CARDIOVERSION;  Surgeon: Skeet Latch, MD;  Location: [facility];  Service: Cardiovascular;  Laterality: N/A;  . CARPAL TUNNEL RELEASE Right Nov 2015  . CARPAL TUNNEL RELEASE Right 1992; 05/2014   [name]; [place]  . CESAREAN SECTION  1982; 1984  . ESOPHAGOGASTRODUODENOSCOPY (EGD) WITH PROPOFOL N/A 04/16/2018   Procedure: ESOPHAGOGASTRODUODENOSCOPY (EGD) WITH PROPOFOL;  Surgeon: Irene Shipper, MD;  Location: [facility] ENDOSCOPY;  Service: Endoscopy;  Laterality: N/A;  . FOOT NEUROMA SURGERY Bilateral 2000  . I&D EXTREMITY Left 01/06/2018   Procedure: DEBRIDEMENT ULCER LEFT FOOT;  Surgeon: [name], MD;  Location: [facility];  Service: Orthopedics;  Laterality: Left;  . KNEE ARTHROSCOPY Right ~ 2003   "meniscus repair"  . SHOULDER OPEN ROTATOR CUFF REPAIR Right 1996; 1998   "w/fracture repair"  . THYROID SURGERY  2000   "removed lots of nodules"  . TONSILLECTOMY  1976    There were no vitals filed for this visit.  Subjective Assessment - 11/09/18 1503    Subjective  Patient saw PA at orthopedist this morning and is now on schedule RTC repair and plans to remove nodules as may be lymphomas while under anethesia.    Pertinent History  R TTA, asthma, CAD, IDDM2, neuropathy, shingles, HTN, Left non-traumatic intracerebral hemorrhage, CKD stage 3, right shoulder impingement syndrome    Limitations  Lifting;Standing;Walking;House hold activities    Patient Stated Goals  To use prosthesis to walk in community & home, gardening, [place]    Currently in Pain?  No/denies    Pain Onset  Yesterday                       [facility] Adult PT Treatment/Exercise - 11/09/18 1500      Transfers   Transfers  Sit to Stand;Stand to Sit    Sit to Stand  6: Modified independent (Device/Increase time);With upper extremity assist;From chair/3-in-1    Stand to Sit  6: Modified independent (Device/Increase time);With upper extremity assist;To chair/3-in-1      Ambulation/Gait   Ambulation/Gait  Yes    Ambulation/Gait Assistance  5: Supervision    Ambulation/Gait Assistance Details  verbal cues on step width    Ambulation Distance (Feet)  400 Feet    Assistive device  Straight cane;Prosthesis;None   enter/exit with cane, session no AD   Gait Pattern  Step-through pattern;Decreased step length - left;Decreased stance time - right;Narrow base of support    Ambulation Surface  Indoor;Level    Stairs  Yes    Stairs Assistance  5: Supervision    Stairs Assistance Details (indicate cue type and reason)  cues on alternate pattern with TTA prosthesis    Stair Management Technique  Two  rails;Alternating pattern;Forwards    Number of Stairs  4   3 reps   Ramp  5: Supervision;Other (comment)   prosthesis only   Ramp Details (indicate cue type and reason)  cues on technique with TTA prosthesis    Curb  5: Supervision   prosthesis only   Curb Details (indicate cue type and reason)  cues on technique with TTA prosthesis      Self-Care   Self-Care  --    Other Self-Care Comments   --      Neuro Re-ed    Neuro Re-ed Details   --      Prosthetics   Prosthetic Care Comments  Distal tibia has signs of precallousing from too much pressure.  PT reviewed adjusting ply socks including sometimes during day as she pumps fluid from limb. Pt ambulated with too few, too many & correct ply socks. Pt verbalizes better understanding.     Current prosthetic wear tolerance (days/week)   daily    Current prosthetic wear tolerance (#hours/day)   most of awake hours    Residual limb condition   distal tibia has rough & darkened skin consistent with precallousing    Education Provided  Skin check;Residual limb care;Correct ply sock adjustment    Person(s) Educated  Patient    Education Method  Explanation;Demonstration;Tactile cues;Verbal cues    Education Method  Verbalized understanding;Returned demonstration;Tactile cues required;Verbal cues required    Donning Prosthesis  Modified independent (device/increased time)    Doffing Prosthesis  Modified independent (device/increased time)             PT Education - 11/09/18 1530    Education Details  PT explained that removal of nodules should not result in inability to ambulate or use LEs. Since cane is used in LUE & RTC sg is RUE, then she should still be able to ambulate with prosthesis after surgery.    Person(s) Educated  Patient    Methods  Explanation;Verbal cues    Comprehension  Verbalized understanding       PT Short Term Goals - 10/24/18 1234      PT SHORT TERM GOAL #1   Title  Patient demonstrates proper adjustment  of ply socks with limb volume changes. (All STGs Target Date 10/20/2018)    Baseline  MET 10/24/2018    Time  4    Period  Weeks    Status  Achieved    Target Date  10/20/18      PT SHORT TERM GOAL #2   Title  Patient tolerates prosthesis wear >12hrs total /day without skin issues.     Baseline  MET 10/24/2018    Time  4    Period  Weeks    Status  Achieved    Target Date  10/20/18      PT SHORT TERM GOAL #3   Title  Berg Balance >/= 40/56    Baseline  MET 10/24/2018  Berg 42/56    Time  4    Period  Weeks    Status  Achieved    Target Date  10/20/18      PT SHORT TERM GOAL #4   Title  Patient ambulates 300' with cane or less & prosthesis with supervision.     Baseline  MET 10/24/2018    Time  4    Period  Weeks    Status  Achieved    Target Date  10/20/18      PT SHORT TERM GOAL #5   Title  Patient negotiates stairs with 2 rails, ramp & curb with cane or less & prosthesis with supervision.     Baseline  MET 10/24/2018    Time  4    Period  Weeks    Status  Achieved    Target Date  10/20/18        PT Long Term Goals - 09/14/18 2135      PT LONG TERM GOAL #1   Title  Patient verbalizes & demonstrates understanding of prosthetic care to enable safe use of prosthesis (All LTGs Target Date 11/15/2018)    Time  12    Period  Weeks  Status  On-going    Target Date  11/15/18      PT LONG TERM GOAL #2   Title  Patient tolerates prosthesis wear >90% of awake hours without skin or pain issues to enable function throughout her day.     Time  12    Period  Weeks    Status  On-going    Target Date  11/15/18      PT LONG TERM GOAL #3   Title  Berg Balance >/= 45/56 to indicate lower fall risk.     Time  12    Period  Weeks    Status  On-going    Target Date  11/15/18      PT LONG TERM GOAL #4   Title  Patient ambulates >500' outdoors with LRAD modified independent to enable safe function in community.     Time  12    Period  Weeks    Status  On-going    Target Date   11/15/18      PT LONG TERM GOAL #5   Title  Patient negotiates stairs, ramps & curbs with LRAD & Prosthesis modified independent to enable community access.     Time  12    Period  Weeks    Status  On-going    Target Date  11/15/18      PT LONG TERM GOAL #6   Title  Patient ambulates 9' around furniture with cane or less & prosthesis carrying household items modified independent for household function.     Time  12    Period  Weeks    Status  On-going    Target Date  11/15/18            Plan - 11/09/18 2104    Clinical Impression Statement  Patient has better understanding of adjusting ply socks. Patient is on target to meet LTGs next week by end of plan of care.    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   Stability/Clinical Decision Making  Evolving/Moderate complexity    Rehab Potential  Good    PT Frequency  2x / week    PT Duration  12 weeks    PT Treatment/Interventions  ADLs/Self Care Home Management;Canalith Repostioning;Moist Heat;DME Instruction;Gait training;Stair training;Functional mobility training;Therapeutic activities;Therapeutic exercise;Balance training;Neuromuscular re-education;Patient/family education;Prosthetic Training;Taping;Vestibular    PT Next Visit Plan  work towards LTGs and assess LTGs over next 2 sessions    Consulted and Agree with Plan of Care  Patient       Patient will benefit from skilled therapeutic intervention in order to improve the following deficits and impairments:  Abnormal gait, Decreased activity tolerance, Decreased  balance, Decreased endurance, Decreased knowledge of use of DME, Decreased mobility, Decreased range of motion, Decreased strength, Dizziness, Increased edema, Postural dysfunction, Prosthetic Dependency, Pain  Visit Diagnosis: 1. Unsteadiness on feet   2. Muscle weakness (generalized)   3. Other abnormalities of gait and mobility   4. Abnormal posture   5. Pain in right lower leg        Problem List Patient Active Problem List   Diagnosis Date Noted  . Diarrhea 09/05/2018  . Urinary tract infection 06/30/2018  . ICH (intracerebral hemorrhage) ([place]) 06/27/2018  .  DKA (diabetic ketoacidoses) ([place]) 06/26/2018  . V-tach ([place]) 05/14/2018  . Seizure ([place]) 05/14/2018  . Slow transit constipation   . Fall   . Labile blood glucose   . Labile blood pressure   . Chronic combined systolic and diastolic congestive heart failure ([place])   . Chronic diastolic congestive heart failure ([place])   . CKD (chronic kidney disease), stage III ([place])   . Type 2 diabetes mellitus with peripheral neuropathy (HCC)   . Amputation of right lower extremity below knee ([place]) 04/20/2018  . 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 Post-operative pain   . PAF (paroxysmal atrial fibrillation) ([place])   . Duodenal ulcer   . Atrial fibrillation with RVR ([place]) 04/14/2018  . Foot osteomyelitis, right ([place]) 04/14/2018  . Cutaneous abscess of left foot   . Acute respiratory failure with hypoxia ([place])   . Cellulitis 01/02/2018  . LGI bleed   . Acute blood loss anemia   . Wide-complex tachycardia ([place]) 07/04/2017  . Type II diabetes mellitus, uncontrolled ([place]) 07/04/2017  . Peripheral neuropathy 07/04/2017  . H/O hyperthyroidism 07/04/2017  . Atrial fibrillation with rapid ventricular response ([place]) 08/06/2016  . S/P transmetatarsal amputation of foot, right ([place]) 04/19/2016  . Obstructive sleep apnea 11/26/2015  . Bilateral carpal tunnel syndrome  11/26/2015  . Hypomagnesemia 11/16/2015  . Hyperlipidemia LDL goal <70 10/13/2015  . Abnormality of gait 09/02/2015  . Memory loss 08/12/2015  . Diabetic peripheral neuropathy ([place]) 08/12/2015  . Vitamin D deficiency 08/12/2015  . Hyperthyroidism 04/30/2015  . Heme positive stool   . Persistent atrial fibrillation 04/29/2015  . Coronary artery disease with stable angina pectoris ([place]) 03/29/2015  . Abnormal nuclear stress test   . History of goiter 09/28/2014  . GERD (gastroesophageal reflux disease) 07/31/2013  . Depression with anxiety 06/01/2013  . DM (diabetes mellitus), type 2 with peripheral vascular complications ([place]) 07/57/3225  . HTN (hypertension) 01/30/2013    Jamey Reas PT, DPT 11/10/2018, 11:06 AM  [facility] [address] [place], [place], [address] Phone: [phone]   Fax:  [phone]  Name: Judith [name] MRN: [id number] Date of Birth: January 20, 1951

## 2018-11-11 ENCOUNTER — Encounter: Payer: Self-pay | Admitting: Physician Assistant

## 2018-11-11 MED ORDER — METHYLPREDNISOLONE ACETATE 40 MG/ML IJ SUSP
40.0000 mg | INTRAMUSCULAR | Status: AC | PRN
  Administered 2018-11-09: 40 mg via INTRA_ARTICULAR

## 2018-11-11 MED ORDER — LIDOCAINE HCL 1 % IJ SOLN
5.0000 mL | INTRAMUSCULAR | Status: AC | PRN
  Administered 2018-11-09: 5 mL

## 2018-11-13 ENCOUNTER — Other Ambulatory Visit: Payer: Self-pay

## 2018-11-13 ENCOUNTER — Ambulatory Visit: Payer: Medicare PPO | Admitting: Physical Therapy

## 2018-11-13 ENCOUNTER — Encounter: Payer: Self-pay | Admitting: Physical Therapy

## 2018-11-13 DIAGNOSIS — R293 Abnormal posture: Secondary | ICD-10-CM | POA: Diagnosis not present

## 2018-11-13 DIAGNOSIS — R2681 Unsteadiness on feet: Secondary | ICD-10-CM | POA: Diagnosis not present

## 2018-11-13 DIAGNOSIS — M6281 Muscle weakness (generalized): Secondary | ICD-10-CM | POA: Diagnosis not present

## 2018-11-13 DIAGNOSIS — M79661 Pain in right lower leg: Secondary | ICD-10-CM | POA: Diagnosis not present

## 2018-11-13 DIAGNOSIS — R2689 Other abnormalities of gait and mobility: Secondary | ICD-10-CM

## 2018-11-13 NOTE — Therapy (Signed)
[facility] [address] [place], [place], [address] Phone: [phone]   Fax:  [phone]  Physical Therapy Treatment  Patient Details  Name: Judith [name] MRN: [id number] Date of Birth: 17-Mar-1951 Referring Provider (PT): [name], MD   Encounter Date: 11/13/2018   CLINIC OPERATION CHANGES: [facility] is open at lower capacity following universal masking, social distancing, and patient screening.   PT End of Session - 11/13/18 1104    Visit Number  15    Number of Visits  25    Date for PT Re-Evaluation  11/15/18    Authorization Type  [employer] & Medicaid- 10th visit progress note    PT Start Time  1100    PT Stop Time  1150    PT Time Calculation (min)  50 min    Equipment Utilized During Treatment  Gait belt    Activity Tolerance  Patient tolerated treatment well;No increased pain    Behavior During Therapy  WFL for tasks assessed/performed       Past Medical History:  Diagnosis Date  . Abnormal EKG 07/31/2013  . Arthritis    "hands" (03/06/2015)  . Asthma   . Carpal tunnel syndrome, bilateral   . Chronic kidney disease   . Complication of anesthesia    slow to wake up. Pt sts she woke up during surgery many years ago.  . Coronary artery disease     2 v CAD with CTO of the RCA and high grade bifurcational LCx/OM stenosis. S/P PCI DES x 2 to the LCx/OM.  Marland Kitchen Diabetic peripheral neuropathy ([place]) "since 1996"  . GERD (gastroesophageal reflux disease)   . Goiter   . Headache    migraines prior to menopause  . History of shingles 06/01/2013  . Hyperlipidemia LDL goal <70 10/13/2015  . Hypertension   . Hyperthyroidism   . Osteomyelitis of foot (HCC)    Right  . PAF (paroxysmal atrial fibrillation) ([facility]) 04/29/2015   CHADS2VASC score of 5 now on Apixaban  . Pneumonia ~ 1976  . Sleep apnea    Bipap  . Tremors of nervous system   . Type II diabetes mellitus (HCC)    insulin dependent     Past Surgical History:  Procedure Laterality Date  . ABDOMINAL HYSTERECTOMY  1988   age 15; CERVICAL DYSPLASIA; ovaries intact.   . AMPUTATION Right 01/23/2016   Procedure: Right 3rd Ray Amputation;  Surgeon: [name], MD;  Location: [facility];  Service: Orthopedics;  Laterality: Right;  . AMPUTATION Right 02/13/2016   Procedure: Right Transmetatarsal Amputation;  Surgeon: [name], MD;  Location: [facility];  Service: Orthopedics;  Laterality: Right;  . AMPUTATION Right 04/15/2018   Procedure: AMPUTATION BELOW KNEE;  Surgeon: [name], MD;  Location: [facility];  Service: Orthopedics;  Laterality: Right;  . BIOPSY  04/16/2018   Procedure: BIOPSY;  Surgeon: Irene Shipper, MD;  Location: [facility];  Service: Endoscopy;;  . CARDIAC CATHETERIZATION N/A 02/27/2015   Procedure: Left Heart Cath and Coronary Angiography;  Surgeon: [name], MD; LAD 40%, mCFX 80%, OM 70%, RCA 100% calcified       . CARDIAC CATHETERIZATION N/A 03/06/2015   Procedure: Coronary Stent Intervention;  Surgeon: [name], MD;  Location: [facility];  Service: Cardiovascular;  Laterality: N/A;  Mid CX 3.50x12 promus DES w/ 0% resdual and Prox OM1 2.50x20 promus DES w/ 20% residual  . CARDIOVERSION    . CARDIOVERSION  N/A 05/22/2018   Procedure: CARDIOVERSION;  Surgeon: Skeet Latch, MD;  Location: [facility];  Service: Cardiovascular;  Laterality: N/A;  . CARPAL TUNNEL RELEASE Right Nov 2015  . CARPAL TUNNEL RELEASE Right 1992; 05/2014   [name]; [place]  . CESAREAN SECTION  1982; 1984  . ESOPHAGOGASTRODUODENOSCOPY (EGD) WITH PROPOFOL N/A 04/16/2018   Procedure: ESOPHAGOGASTRODUODENOSCOPY (EGD) WITH PROPOFOL;  Surgeon: Irene Shipper, MD;  Location: [facility]COPY;  Service: Endoscopy;  Laterality: N/A;  . FOOT NEUROMA SURGERY Bilateral 2000  . I&D EXTREMITY Left 01/06/2018   Procedure: DEBRIDEMENT ULCER LEFT FOOT;  Surgeon: [name], MD;  Location: [facility];  Service: Orthopedics;   Laterality: Left;  . KNEE ARTHROSCOPY Right ~ 2003   "meniscus repair"  . SHOULDER OPEN ROTATOR CUFF REPAIR Right 1996; 1998   "w/fracture repair"  . THYROID SURGERY  2000   "removed lots of nodules"  . TONSILLECTOMY  1976    There were no vitals filed for this visit.  Subjective Assessment - 11/13/18 1102    Subjective  No new complaints. No falls. Still with some tenderness at the tibia area of her right limb, has been adjusting socks as needed.    Pertinent History  R TTA, asthma, CAD, IDDM2, neuropathy, shingles, HTN, Left non-traumatic intracerebral hemorrhage, CKD stage 3, right shoulder impingement syndrome    Limitations  Lifting;Standing;Walking;House hold activities    Patient Stated Goals  To use prosthesis to walk in community & home, gardening, [place]    Currently in Pain?  Yes    Pain Score  5    with activity   Pain Location  Leg    Pain Orientation  Left;Posterior    Pain Descriptors / Indicators  Aching;Sore;Tender    Pain Type  Chronic pain;Phantom pain    Pain Onset  More than a month ago    Pain Frequency  Intermittent    Aggravating Factors   certain movements,    Pain Relieving Factors  rest         [facility] PT Assessment - 11/13/18 1106      Berg Balance Test   Sit to Stand  Able to stand without using hands and stabilize independently    Standing Unsupported  Able to stand safely 2 minutes    Sitting with Back Unsupported but Feet Supported on Floor or Stool  Able to sit safely and securely 2 minutes    Stand to Sit  Sits safely with minimal use of hands    Transfers  Able to transfer safely, minor use of hands    Standing Unsupported with Eyes Closed  Able to stand 10 seconds safely    Standing Unsupported with Feet Together  Able to place feet together independently and stand 1 minute safely    From Standing, Reach Forward with Outstretched Arm  Can reach confidently >25 cm (10")    From Standing Position, Pick up Object from Floor   Able to pick up shoe safely and easily    From Standing Position, Turn to Look Behind Over each Shoulder  Looks behind from both sides and weight shifts well    Turn 360 Degrees  Able to turn 360 degrees safely one side only in 4 seconds or less    Standing Unsupported, Alternately Place Feet on Step/Stool  Able to complete 4 steps without aid or supervision    Standing Unsupported, One Foot in Front  Able to plae foot ahead of the other independently and  hold 30 seconds    Standing on One Leg  Able to lift leg independently and hold > 10 seconds    Total Score  52    Berg comment:  52/56= lower risk for falls         [facility] Adult PT Treatment/Exercise - 11/13/18 1106      Transfers   Transfers  Sit to Stand;Stand to Sit    Sit to Stand  6: Modified independent (Device/Increase time);With upper extremity assist;From chair/3-in-1    Stand to Sit  6: Modified independent (Device/Increase time);With upper extremity assist;To chair/3-in-1      Ambulation/Gait   Ambulation/Gait  Yes    Ambulation/Gait Assistance  5: Supervision;4: Min assist    Ambulation/Gait Assistance Details  pt wtih loss of balance going from grass to solid surface (pavement) needing assist to prevent fall. otherwise pt was supervision. should be noted that she was holding the cane, not using it with the near fall.     Ambulation Distance (Feet)  500 Feet   x1 in/outdoor   Assistive device  Straight cane;Prosthesis;None    Gait Pattern  Step-through pattern;Decreased step length - left;Decreased stance time - right;Narrow base of support    Ambulation Surface  Level;Unlevel;Indoor;Outdoor;Paved;Gravel;Grass    Stairs  Yes    Stairs Assistance  5: Supervision;6: Modified independent (Device/Increase time)   mod I with bil rails, supervision with single rail   Stairs Assistance Details (indicate cue type and reason)  2 reps with bil rails using reciprocal pattern; 1 rep with each single rail reciprocal to ascend/step to  pattern to descend    Stair Management Technique  One rail Right;One rail Left;Two rails;Alternating pattern;Step to pattern;Forwards    Number of Stairs  4   x4 reps   Height of Stairs  6    Door Management  5: Supervision    Door Managment Details (indicate cue type and reason)  with cane assist to block it open    Ramp  5: Supervision    Ramp Details (indicate cue type and reason)  prosthesis only    Curb  5: Supervision    Curb Details (indicate cue type and reason)  prosthesis only      Prosthetics   Current prosthetic wear tolerance (days/week)   daily    Current prosthetic wear tolerance (#hours/day)   most of awake hours    Residual limb condition   intact per pt report.     Donning Prosthesis  Modified independent (device/increased time)    Doffing Prosthesis  Modified independent (device/increased time)          Balance Exercises - 11/13/18 1134      Balance Exercises: Standing   Standing Eyes Closed  Narrow base of support (BOS);Wide (BOA);Head turns;Foam/compliant surface;Other reps (comment);20 secs;Limitations      Balance Exercises: Standing   Standing Eyes Closed Limitations  on pillows in corner with chair in front for safety: wide base of support progressing to narrow base of support for EC no head movements, then wide base of support for EC head movements left<>right, up<>down and diagonals both ways. issued HEP for pt to do at home.         PT Education - 11/13/18 1327    Education Details  progress toward LTGs checked; corner balance HEP ([facility] program)    Person(s) Educated  Patient    Methods  Explanation;Demonstration;Verbal cues;Handout    Comprehension  Verbalized understanding;Returned demonstration;Verbal cues required;Need further instruction  PT Short Term Goals - 10/24/18 1234      PT SHORT TERM GOAL #1   Title  Patient demonstrates proper adjustment of ply socks with limb volume changes. (All STGs Target Date 10/20/2018)     Baseline  MET 10/24/2018    Time  4    Period  Weeks    Status  Achieved    Target Date  10/20/18      PT SHORT TERM GOAL #2   Title  Patient tolerates prosthesis wear >12hrs total /day without skin issues.     Baseline  MET 10/24/2018    Time  4    Period  Weeks    Status  Achieved    Target Date  10/20/18      PT SHORT TERM GOAL #3   Title  Berg Balance >/= 40/56    Baseline  MET 10/24/2018  Berg 42/56    Time  4    Period  Weeks    Status  Achieved    Target Date  10/20/18      PT SHORT TERM GOAL #4   Title  Patient ambulates 300' with cane or less & prosthesis with supervision.     Baseline  MET 10/24/2018    Time  4    Period  Weeks    Status  Achieved    Target Date  10/20/18      PT SHORT TERM GOAL #5   Title  Patient negotiates stairs with 2 rails, ramp & curb with cane or less & prosthesis with supervision.     Baseline  MET 10/24/2018    Time  4    Period  Weeks    Status  Achieved    Target Date  10/20/18        PT Long Term Goals - 11/13/18 1104      PT LONG TERM GOAL #1   Title  Patient verbalizes & demonstrates understanding of prosthetic care to enable safe use of prosthesis (All LTGs Target Date 11/15/2018)    Baseline  11/13/18: met today    Status  Achieved      PT LONG TERM GOAL #2   Title  Patient tolerates prosthesis wear >90% of awake hours without skin or pain issues to enable function throughout her day.     Baseline  11/13/18: met today    Status  Achieved      PT LONG TERM GOAL #3   Title  Berg Balance >/= 45/56 to indicate lower fall risk.     Baseline  11/13/18: 52/56 scored today    Time  --    Period  --    Status  Achieved      PT LONG TERM GOAL #4   Title  Patient ambulates >500' outdoors with LRAD modified independent to enable safe function in community.     Time  12    Period  Weeks    Status  On-going      PT LONG TERM GOAL #5   Title  Patient negotiates stairs, ramps & curbs with LRAD & Prosthesis modified independent to  enable community access.     Time  12    Period  Weeks    Status  On-going      PT LONG TERM GOAL #6   Title  Patient ambulates 71' around furniture with cane or less & prosthesis carrying household items modified independent for household function.     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    PT Home Exercise Plan  Access Code: [email or web address]    Consulted and Agree with Plan of Care  Patient       Patient will benefit from skilled therapeutic intervention in order to improve the following deficits and impairments:  Abnormal gait, Decreased activity tolerance, Decreased balance, Decreased endurance, Decreased knowledge of use of DME, Decreased mobility, Decreased range of motion, Decreased strength, Dizziness, Increased edema, Postural dysfunction, Prosthetic Dependency, Pain  Visit Diagnosis: 1. Unsteadiness on feet   2. Muscle weakness  (generalized)   3. Other abnormalities of gait and mobility   4. Abnormal posture        Problem List Patient Active Problem List   Diagnosis Date Noted  . Diarrhea 09/05/2018  . Urinary tract infection 06/30/2018  . ICH (intracerebral hemorrhage) ([facility]) 06/27/2018  . DKA (diabetic ketoacidoses) ([facility]) 06/26/2018  . V-tach ([place]) 05/14/2018  . Seizure ([facility]) 05/14/2018  . Slow transit constipation   . Fall   . Labile blood glucose   . Labile blood pressure   . Chronic combined systolic and diastolic congestive heart failure ([place] [place])   . Chronic diastolic congestive heart failure ([place])   . CKD (chronic kidney disease), stage III ([place])   . Type 2 diabetes mellitus with peripheral neuropathy (HCC)   . Amputation of right lower extremity below knee ([place]) 04/20/2018  . 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 Post-operative pain   . PAF (paroxysmal atrial fibrillation) ([place])   . Duodenal ulcer   . Atrial fibrillation with RVR ([place]) 04/14/2018  . Foot osteomyelitis, right ([place]) 04/14/2018  . Cutaneous abscess of left foot   . Acute respiratory failure with hypoxia ([place])   . Cellulitis 01/02/2018  . LGI bleed   . Acute blood loss anemia   . Wide-complex tachycardia ([place]) 07/04/2017  . Type II diabetes mellitus, uncontrolled ([facility]) 07/04/2017  . Peripheral neuropathy 07/04/2017  . H/O hyperthyroidism 07/04/2017  . Atrial fibrillation with rapid ventricular response ([facility]) 08/06/2016  . S/P transmetatarsal amputation of foot, right ([place]) 04/19/2016  . Obstructive sleep apnea 11/26/2015  . Bilateral carpal tunnel syndrome 11/26/2015  . Hypomagnesemia 11/16/2015  . Hyperlipidemia LDL goal <70 10/13/2015  . Abnormality of gait 09/02/2015  . Memory loss 08/12/2015  . Diabetic peripheral neuropathy ([facility]) 08/12/2015  . Vitamin D deficiency 08/12/2015  . Hyperthyroidism 04/30/2015  . Heme positive stool   . Persistent atrial  fibrillation 04/29/2015  . Coronary artery disease with stable angina pectoris ([facility]) 03/29/2015  . Abnormal nuclear stress test   .  History of goiter 09/28/2014  . GERD (gastroesophageal reflux disease) 07/31/2013  . Depression with anxiety 06/01/2013  . DM (diabetes mellitus), type 2 with peripheral vascular complications ([facility]) 00/37/9444  . HTN (hypertension) 01/30/2013   Willow Ora, PTA, [facility] [address], [place], [place] [address] [phone] 11/14/18, 2:16 PM   Name: Judith [name] MRN: [id number] Date of Birth: 06-10-1950

## 2018-11-13 NOTE — Patient Instructions (Signed)
Access Code: [facility]  URL: [email or web address]  Date: 11/13/2018  Prepared by: Willow Ora   Exercises  Romberg Stance Eyes Closed on Foam Pad - 3 reps - 1 sets - 20 hold - 1x daily - 5x weekly  Wide Stance with Eyes Closed and Head Rotation on Foam Pad - 10 reps - 1 sets - 1x daily - 5x weekly  Wide Stance with Eyes Closed and Head Nods on Foam Pad - 10 reps - 1 sets - 1x daily - 5x weekly

## 2018-11-14 ENCOUNTER — Telehealth: Payer: Self-pay | Admitting: *Deleted

## 2018-11-14 MED ORDER — FUROSEMIDE 20 MG PO TABS: ORAL_TABLET | ORAL | 3 refills | Status: DC

## 2018-11-14 MED ORDER — POTASSIUM CHLORIDE ER 10 MEQ PO TBCR: EXTENDED_RELEASE_TABLET | ORAL | 3 refills | Status: DC

## 2018-11-14 NOTE — Telephone Encounter (Signed)
-----   Message from Isaiah Serge, NP sent at 11/07/2018 11:03 AM EDT ----- Mg+ is normal.  Her kidney function Cr is a little stressed.  Have her take the lasix and K+ only twice a week.  thanks

## 2018-11-15 ENCOUNTER — Encounter: Payer: Self-pay | Admitting: Physical Therapy

## 2018-11-15 ENCOUNTER — Ambulatory Visit: Payer: Medicare PPO | Admitting: Physical Therapy

## 2018-11-15 ENCOUNTER — Other Ambulatory Visit: Payer: Self-pay

## 2018-11-15 DIAGNOSIS — M6281 Muscle weakness (generalized): Secondary | ICD-10-CM | POA: Diagnosis not present

## 2018-11-15 DIAGNOSIS — R2689 Other abnormalities of gait and mobility: Secondary | ICD-10-CM

## 2018-11-15 DIAGNOSIS — M79661 Pain in right lower leg: Secondary | ICD-10-CM | POA: Diagnosis not present

## 2018-11-15 DIAGNOSIS — R293 Abnormal posture: Secondary | ICD-10-CM | POA: Diagnosis not present

## 2018-11-15 DIAGNOSIS — R2681 Unsteadiness on feet: Secondary | ICD-10-CM

## 2018-11-15 DIAGNOSIS — G4733 Obstructive sleep apnea (adult) (pediatric): Secondary | ICD-10-CM | POA: Diagnosis not present

## 2018-11-16 NOTE — Therapy (Signed)
[facility] [address] [place], [place], [address] Phone: [phone]   Fax:  [phone]  Physical Therapy Treatment & Discharge Summary  Patient Details  Name: Judith [name] MRN: [id number] Date of Birth: 23-Jan-1951 Referring Provider (PT): [name], MD   Encounter Date: 11/15/2018   CLINIC OPERATION CHANGES: Outpatient [facility] is open at lower capacity following universal masking, social distancing, and patient screening.  The patient's COVID risk of complications score is 5.  PHYSICAL THERAPY DISCHARGE SUMMARY  Visits from Start of Care: 16  Current functional level related to goals / functional outcomes: See note below   Remaining deficits: See note below   Education / Equipment: Prosthetic care/use & HEP  Plan: Patient agrees to discharge.  Patient goals were met. Patient is being discharged due to meeting the stated rehab goals.  ?????        PT End of Session - 11/15/18 1244    Visit Number  16    Number of Visits  25    Date for PT Re-Evaluation  11/15/18    Authorization Type  [employer] Medicaid- 10th visit progress note    PT Start Time  1388    PT Stop Time  1237    PT Time Calculation (min)  40 min    Equipment Utilized During Treatment  Gait belt    Activity Tolerance  Patient tolerated treatment well;No increased pain    Behavior During Therapy  WFL for tasks assessed/performed       Past Medical History:  Diagnosis Date  . Abnormal EKG 07/31/2013  . Arthritis    "hands" (03/06/2015)  . Asthma   . Carpal tunnel syndrome, bilateral   . Chronic kidney disease   . Complication of anesthesia    slow to wake up. Pt sts she woke up during surgery many years ago.  . Coronary artery disease     2 v CAD with CTO of the RCA and high grade bifurcational LCx/OM stenosis. S/P PCI DES x 2 to the LCx/OM.  Marland Kitchen Diabetic peripheral neuropathy ([place]) "since 1996"  . GERD  (gastroesophageal reflux disease)   . Goiter   . Headache    migraines prior to menopause  . History of shingles 06/01/2013  . Hyperlipidemia LDL goal <70 10/13/2015  . Hypertension   . Hyperthyroidism   . Osteomyelitis of foot (HCC)    Right  . PAF (paroxysmal atrial fibrillation) ([place]) 04/29/2015   CHADS2VASC score of 5 now on Apixaban  . Pneumonia ~ 1976  . Sleep apnea    Bipap  . Tremors of nervous system   . Type II diabetes mellitus (HCC)    insulin dependent    Past Surgical History:  Procedure Laterality Date  . ABDOMINAL HYSTERECTOMY  1988   age 77; CERVICAL DYSPLASIA; ovaries intact.   . AMPUTATION Right 01/23/2016   Procedure: Right 3rd Ray Amputation;  Surgeon: [name], MD;  Location: [facility];  Service: Orthopedics;  Laterality: Right;  . AMPUTATION Right 02/13/2016   Procedure: Right Transmetatarsal Amputation;  Surgeon: [name], MD;  Location: [facility];  Service: Orthopedics;  Laterality: Right;  . AMPUTATION Right 04/15/2018   Procedure: AMPUTATION BELOW KNEE;  Surgeon: [name], MD;  Location: [facility];  Service: Orthopedics;  Laterality: Right;  . BIOPSY  04/16/2018   Procedure: BIOPSY;  Surgeon: Irene Shipper, MD;  Location: [facility] ENDOSCOPY;  Service: Endoscopy;;  . CARDIAC CATHETERIZATION N/A 02/27/2015  Procedure: Left Heart Cath and Coronary Angiography;  Surgeon: [name], MD; LAD 40%, mCFX 80%, OM 70%, RCA 100% calcified       . CARDIAC CATHETERIZATION N/A 03/06/2015   Procedure: Coronary Stent Intervention;  Surgeon: [name], MD;  Location: [facility];  Service: Cardiovascular;  Laterality: N/A;  Mid CX 3.50x12 promus DES w/ 0% resdual and Prox OM1 2.50x20 promus DES w/ 20% residual  . CARDIOVERSION    . CARDIOVERSION N/A 05/22/2018   Procedure: CARDIOVERSION;  Surgeon: Skeet Latch, MD;  Location: [facility];  Service: Cardiovascular;  Laterality: N/A;  . CARPAL TUNNEL RELEASE Right Nov 2015  . CARPAL TUNNEL RELEASE Right  1992; 05/2014   [name]; [place]  . CESAREAN SECTION  1982; 1984  . ESOPHAGOGASTRODUODENOSCOPY (EGD) WITH PROPOFOL N/A 04/16/2018   Procedure: ESOPHAGOGASTRODUODENOSCOPY (EGD) WITH PROPOFOL;  Surgeon: Irene Shipper, MD;  Location: [facility] ENDOSCOPY;  Service: Endoscopy;  Laterality: N/A;  . FOOT NEUROMA SURGERY Bilateral 2000  . I&D EXTREMITY Left 01/06/2018   Procedure: DEBRIDEMENT ULCER LEFT FOOT;  Surgeon: [name], MD;  Location: [facility];  Service: Orthopedics;  Laterality: Left;  . KNEE ARTHROSCOPY Right ~ 2003   "meniscus repair"  . SHOULDER OPEN ROTATOR CUFF REPAIR Right 1996; 1998   "w/fracture repair"  . THYROID SURGERY  2000   "removed lots of nodules"  . TONSILLECTOMY  1976    There were no vitals filed for this visit.  Subjective Assessment - 11/15/18 1157    Subjective  She found another lump on left buttocks. She feels that PT has helped her a lot with improving her mobility.    Pertinent History  R TTA, asthma, CAD, IDDM2, neuropathy, shingles, HTN, Left non-traumatic intracerebral hemorrhage, CKD stage 3, right shoulder impingement syndrome    Limitations  Lifting;Standing;Walking;House hold activities    Patient Stated Goals  To use prosthesis to walk in community & home, gardening, [place]    Currently in Pain?  No/denies    Pain Onset  More than a month ago         [facility] PT Assessment - 11/15/18 1200      Functional Gait  Assessment   Gait assessed   Yes    Gait Level Surface  Walks 20 ft, slow speed, abnormal gait pattern, evidence for imbalance or deviates 10-15 in outside of the 12 in walkway width. Requires more than 7 sec to ambulate 20 ft.    Change in Gait Speed  Able to change speed, demonstrates mild gait deviations, deviates 6-10 in outside of the 12 in walkway width, or no gait deviations, unable to achieve a major change in velocity, or uses a change in velocity, or uses an assistive device.    Gait with Horizontal Head Turns  Performs  head turns smoothly with slight change in gait velocity (eg, minor disruption to smooth gait path), deviates 6-10 in outside 12 in walkway width, or uses an assistive device.    Gait with Vertical Head Turns  Performs task with slight change in gait velocity (eg, minor disruption to smooth gait path), deviates 6 - 10 in outside 12 in walkway width or uses assistive device    Gait and Pivot Turn  Pivot turns safely in greater than 3 sec and stops with no loss of balance, or pivot turns safely within 3 sec and stops with mild imbalance, requires small steps to catch balance.    Step Over Obstacle  Is able  to step over one shoe box (4.5 in total height) without changing gait speed. No evidence of imbalance.    Gait with Narrow Base of Support  Ambulates less than 4 steps heel to toe or cannot perform without assistance.    Gait with Eyes Closed  Walks 20 ft, slow speed, abnormal gait pattern, evidence for imbalance, deviates 10-15 in outside 12 in walkway width. Requires more than 9 sec to ambulate 20 ft.    Ambulating Backwards  Walks 20 ft, slow speed, abnormal gait pattern, evidence for imbalance, deviates 10-15 in outside 12 in walkway width.    Steps  Alternating feet, must use rail.    Total Score  15                   [facility] Adult PT Treatment/Exercise - 11/15/18 1200      Transfers   Transfers  Sit to Stand;Stand to Sit    Sit to Stand  7: Independent;Without upper extremity assist;From chair/3-in-1    Stand to Sit  7: Independent;With upper extremity assist;To chair/3-in-1      Ambulation/Gait   Ambulation/Gait  Yes    Ambulation/Gait Assistance  6: Modified independent (Device/Increase time)    Ambulation Distance (Feet)  600 Feet    Assistive device  Prosthesis;None    Gait Pattern  Within Functional Limits;Step-through pattern;Decreased stance time - right    Ambulation Surface  Indoor;Level;Outdoor;Paved;Gravel;Grass    Gait velocity  2.78 ft/sec   0.94 ft/sec at  [employer]  Yes    Stairs Assistance  6: Modified independent (Device/Increase time)    Stair Management Technique  One rail Right;Alternating pattern;Forwards    Number of Stairs  4   2 reps   Ramp  6: Modified independent (Device)   prosthesis only   Curb  6: Modified independent (Device/increase time)   prosthesis only     Prosthetics   Prosthetic Care Comments   follow-up with prosthetist regularly & socket revision ~6 months into prosthesis (01/31/2019)    Current prosthetic wear tolerance (days/week)   daily    Current prosthetic wear tolerance (#hours/day)   most of awake hours    Current prosthetic weight-bearing tolerance (hours/day)   No pain or discomfort with standing & gait activities.     Edema  no edema    Residual limb condition   no issues except mild callousing starting at distal tibia, conical shape    Education Provided  Other (comment)   see prosthetic care comments   Person(s) Educated  Patient    Education Method  Explanation    Education Method  Verbalized understanding    Donning Prosthesis  Modified independent (device/increased time)    Doffing Prosthesis  Modified independent (device/increased time)               PT Short Term Goals - 10/24/18 1234      PT SHORT TERM GOAL #1   Title  Patient demonstrates proper adjustment of ply socks with limb volume changes. (All STGs Target Date 10/20/2018)    Baseline  MET 10/24/2018    Time  4    Period  Weeks    Status  Achieved    Target Date  10/20/18      PT SHORT TERM GOAL #2   Title  Patient tolerates prosthesis wear >12hrs total /day without skin issues.     Baseline  MET 10/24/2018    Time  4    Period  Weeks  Status  Achieved    Target Date  10/20/18      PT SHORT TERM GOAL #3   Title  Berg Balance >/= 40/56    Baseline  MET 10/24/2018  Merrilee Jansky 42/56    Time  4    Period  Weeks    Status  Achieved    Target Date  10/20/18      PT SHORT TERM GOAL #4   Title  Patient ambulates  300' with cane or less & prosthesis with supervision.     Baseline  MET 10/24/2018    Time  4    Period  Weeks    Status  Achieved    Target Date  10/20/18      PT SHORT TERM GOAL #5   Title  Patient negotiates stairs with 2 rails, ramp & curb with cane or less & prosthesis with supervision.     Baseline  MET 10/24/2018    Time  4    Period  Weeks    Status  Achieved    Target Date  10/20/18        PT Long Term Goals - 11/16/18 1201      PT LONG TERM GOAL #1   Title  Patient verbalizes & demonstrates understanding of prosthetic care to enable safe use of prosthesis (All LTGs Target Date 11/15/2018)    Baseline  11/13/18: met today    Status  Achieved      PT LONG TERM GOAL #2   Title  Patient tolerates prosthesis wear >90% of awake hours without skin or pain issues to enable function throughout her day.     Baseline  11/13/18: met today    Status  Achieved      PT LONG TERM GOAL #3   Title  Berg Balance >/= 45/56 to indicate lower fall risk.     Baseline  11/13/18: 52/56 scored today    Status  Achieved      PT LONG TERM GOAL #4   Title  Patient ambulates >500' outdoors with LRAD modified independent to enable safe function in community.     Baseline  met 11/15/2018  Functional Gait Assessment with prosthesis onlly 15/30    Time  12    Period  Weeks    Status  Achieved      PT LONG TERM GOAL #5   Title  Patient negotiates stairs, ramps & curbs with LRAD & Prosthesis modified independent to enable community access.     Baseline  MET 11/15/2018    Time  12    Period  Weeks    Status  Achieved      PT LONG TERM GOAL #6   Title  Patient ambulates 60' around furniture with cane or less & prosthesis carrying household items modified independent for household function.     Baseline  MET 11/15/2018    Time  12    Period  Weeks    Status  Achieved            Plan - 11/15/18 1900    Clinical Impression Statement  Patient met all LTGs. She is able to ambulate with prosthesis only  600' including grass surfaces, ramps, curbs & stairs. She uses cane for endurance issues beyond 600' currently. Functional Gait Assessment with prosthesis only does still indicate fall risk with score <19/30.    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   Stability/Clinical Decision Making  Evolving/Moderate complexity    Rehab Potential  Good    PT Frequency  2x / week    PT Duration  12 weeks    PT Treatment/Interventions  ADLs/Self Care Home Management;Canalith Repostioning;Moist Heat;DME Instruction;Gait training;Stair training;Functional mobility training;Therapeutic activities;Therapeutic exercise;Balance training;Neuromuscular re-education;Patient/family education;Prosthetic Training;Taping;Vestibular    PT Next Visit Plan  discharge PT    PT Home Exercise Plan  Access Code: [email or web address]    Consulted and Agree with Plan of Care  Patient       Patient will benefit from skilled therapeutic intervention in order to improve the following deficits and impairments:  Abnormal gait, Decreased activity tolerance, Decreased balance, Decreased endurance, Decreased knowledge of use of DME, Decreased mobility, Decreased range of motion, Decreased strength, Dizziness, Increased edema, Postural dysfunction, Prosthetic Dependency, Pain  Visit Diagnosis: 1. Muscle weakness (generalized)   2. Unsteadiness on feet   3. Other abnormalities of gait and mobility   4. Abnormal posture        Problem List Patient Active Problem List   Diagnosis Date Noted  . Diarrhea 09/05/2018  . Urinary  tract infection 06/30/2018  . ICH (intracerebral hemorrhage) ([place]) 06/27/2018  . DKA (diabetic ketoacidoses) ([place]) 06/26/2018  . V-tach ([place]) 05/14/2018  . Seizure ([place]) 05/14/2018  . Slow transit constipation   . Fall   . Labile blood glucose   . Labile blood pressure   . Chronic combined systolic and diastolic congestive heart failure ([place])   . Chronic diastolic congestive heart failure ([place])   . CKD (chronic kidney disease), stage III ([place])   . Type 2 diabetes mellitus with peripheral neuropathy (HCC)   . Amputation of right lower extremity below knee ([place]) 04/20/2018  . 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 Post-operative pain   . PAF (paroxysmal atrial fibrillation) ([place])   . Duodenal ulcer   . Atrial fibrillation with RVR ([place]) 04/14/2018  . Foot osteomyelitis, right ([place]) 04/14/2018  . Cutaneous abscess of left foot   . Acute respiratory failure with hypoxia ([place])   . Cellulitis 01/02/2018  . LGI bleed   . Acute blood loss anemia   . Wide-complex tachycardia ([place]) 07/04/2017  . Type II diabetes mellitus, uncontrolled ([place]) 07/04/2017  . Peripheral neuropathy 07/04/2017  . H/O hyperthyroidism 07/04/2017  . Atrial fibrillation with rapid ventricular response ([place]) 08/06/2016  . S/P transmetatarsal amputation of foot, right ([place]) 04/19/2016  . Obstructive sleep apnea 11/26/2015  . Bilateral carpal tunnel syndrome 11/26/2015  . Hypomagnesemia 11/16/2015  . Hyperlipidemia LDL goal <70 10/13/2015  . Abnormality of gait 09/02/2015  . Memory loss 08/12/2015  . Diabetic peripheral neuropathy ([place]) 08/12/2015  . Vitamin D deficiency 08/12/2015  . Hyperthyroidism 04/30/2015  . Heme positive stool   . Persistent atrial fibrillation 04/29/2015  . Coronary artery disease with stable angina pectoris ([place]) 03/29/2015  . Abnormal nuclear stress test   . History of goiter 09/28/2014  . GERD (gastroesophageal reflux  disease) 07/31/2013  . Depression with anxiety 06/01/2013  . DM (diabetes mellitus), type 2 with peripheral vascular complications ([place]) 54/01/8118  . HTN (hypertension) 01/30/2013    Wolfe Camarena PT, DPT 11/16/2018, 12:09 PM  [facility] [address] [place],  [place], [address] Phone: [phone]   Fax:  [phone]  Name: NASHANTI DUQUETTE MRN: [id number] Date of Birth: [birth year]

## 2018-11-17 ENCOUNTER — Ambulatory Visit (INDEPENDENT_AMBULATORY_CARE_PROVIDER_SITE_OTHER): Payer: Medicare PPO | Admitting: Family Medicine

## 2018-11-17 ENCOUNTER — Other Ambulatory Visit: Payer: Self-pay

## 2018-11-17 ENCOUNTER — Encounter: Payer: Self-pay | Admitting: Family Medicine

## 2018-11-17 VITALS — BP 140/65 | HR 57 | Temp 98.5°F | Ht 64.0 in | Wt 163.0 lb

## 2018-11-17 DIAGNOSIS — R2241 Localized swelling, mass and lump, right lower limb: Secondary | ICD-10-CM

## 2018-11-17 DIAGNOSIS — D649 Anemia, unspecified: Secondary | ICD-10-CM

## 2018-11-17 LAB — CBC
Hematocrit: 32.7 % — ABNORMAL LOW (ref 34.0–46.6)
Hemoglobin: 11.1 g/dL (ref 11.1–15.9)
MCH: 29.6 pg (ref 26.6–33.0)
MCHC: 33.9 g/dL (ref 31.5–35.7)
MCV: 87 fL (ref 79–97)
Platelets: 352 10*3/uL (ref 150–450)
RBC: 3.75 x10E6/uL — ABNORMAL LOW (ref 3.77–5.28)
RDW: 13.4 % (ref 11.7–15.4)
WBC: 8 10*3/uL (ref 3.4–10.8)

## 2018-11-17 NOTE — Progress Notes (Signed)
7/10/202011:23 AM  Judith [name] [birth year], 68 y.o., female [id number]  Chief Complaint  Patient presents with  . Referral    needs referral for derm per [facility] for the lumps on the skin    HPI:   Patient is a 68 y.o. female with past medical history significant for DM2,afib,CAD,peripheral neuropathy, and right foot amputation,hypomagenesemia, anemia from chronic blood loss, L thalamic ICH, OSA on bipap  who presents today requesting referral for derm  Requesting referral to derm for removal of painful nodules  [place]: They all have similar echotexture. These are mildly hypoechoic compared to the subcutaneous fat. These are sharply marginated. There is a linear septation suggested in the largest lesion. No definite hilum or vascular pedicle. There is no hyperemia identified on color Doppler.  IMPRESSION: 3 non-specific soft tissue nodules measured up to 1.7 cm corresponding to palpable region.  Cleared for surgery for R rotator cuff by cards Reports she had her covid test done by [facility], reports it was negative, needs to bring in report for scanning  Stopped iron as having egd on the 17th  Has no acute concerns today  Patient Care Team: Rutherford Guys, MD as PCP - General (Family Medicine) Thompson Grayer, MD as PCP - Electrophysiology (Cardiology) Sueanne Margarita, MD as PCP - Cardiology (Cardiology) Iran Planas, MD as Consulting Physician (Orthopedic Surgery) Marygrace Drought, MD as Consulting Physician (Ophthalmology) Renato Shin, MD as Consulting Physician (Endocrinology) Sueanne Margarita, MD as Consulting Physician (Cardiology)  Lab Results  Component Value Date   IRON 64 10/18/2018   TIBC 298 10/18/2018   FERRITIN 116 10/18/2018   Lab Results  Component Value Date   WBC 9.2 10/13/2018   HGB 9.5 (L) 10/13/2018   HCT 29.2 (L) 10/13/2018   MCV 88 10/13/2018   PLT 387 10/13/2018    Depression screen PHQ 2/9 11/17/2018 10/18/2018 09/05/2018  Decreased  Interest 0 0 0  Down, Depressed, Hopeless 0 0 0  PHQ - 2 Score 0 0 0  Some recent data might be hidden    Fall Risk  11/17/2018 10/18/2018 09/05/2018 07/07/2018 06/16/2018  Falls in the past year? 0 0 0 0 0  Number falls in past yr: 0 0 - 0 0  Injury with Fall? 0 0 0 0 0  Comment - - - - -  Risk Factor Category  - - - - -  Risk for fall due to : - - - - -  Risk for fall due to: Comment - - - - -  Follow up - - - - -     Allergies  Allergen Reactions  . Contrast Media [Iodinated Diagnostic Agents] Hives and Other (See Comments)    Spoke to patient, Iodine allergy is really IV contrast allergy.   [name] [Insulin Aspart] Shortness Of Breath and Other (See Comments)    "breathing problems"  . Codeine Nausea And Vomiting and Other (See Comments)    HIGH DOSES-SEVERE VOMITING  . Iodine Other (See Comments)    MUST HAVE BENADRYL PRIOR TO PROCEDURE AND RIGHT BEFORE TREATMENT TO COUNTERACT REACTION-BLISTERING REACTION DERMATOLOGICAL  . Penicillins Itching, Rash and Other (See Comments)    Has patient had a PCN reaction causing immediate rash, facial/tongue/throat swelling, SOB or lightheadedness with hypotension: no Has patient had a PCN reaction causing severe rash involving mucus membranes or skin necrosis: No Has patient had a PCN reaction that required hospitalization No Has patient had a PCN reaction occurring within the last 10  years: No If all of the above answers are "NO", then may proceed with Cephalosporin use.  CHEST SIZED RASH AND ITCHING   . Ace Inhibitors Cough  . Demerol [Meperidine] Nausea And Vomiting  . Dilaudid [Hydromorphone Hcl] Other (See Comments)    HEADACHE   . Neosporin [Neomycin-Bacitracin Zn-Polymyx] Itching, Rash and Other (See Comments)    MAKES REACTIONS WORSE WHEN USING AS PROPHYLACTIC  . Percocet [Oxycodone-Acetaminophen] Rash  . Tape Itching and Rash    Prior to Admission medications   Medication Sig Start Date End Date Taking? Authorizing Provider   acetaminophen (TYLENOL) 325 MG tablet Take 1-2 tablets (325-650 mg total) by mouth every 6 (six) hours as needed for mild pain (pain score 1-3 or temp > 100.5). 05/01/18  Yes Angiulli, Lavon Paganini, PA-C  albuterol (PROVENTIL HFA) 108 (90 Base) MCG/ACT inhaler Inhale 1-2 puffs into the lungs every 6 (six) hours as needed for wheezing or shortness of breath. 05/01/18  Yes Angiulli, Lavon Paganini, PA-C  amiodarone (PACERONE) 200 MG tablet Take 1 tablet (200 mg total) by mouth daily. 06/08/18  Yes Sherran Needs, NP  apixaban (ELIQUIS) 5 MG TABS tablet Take 1 tablet (5 mg total) by mouth 2 (two) times daily. 05/01/18  Yes Angiulli, Lavon Paganini, PA-C  atorvastatin (LIPITOR) 20 MG tablet Take 1 tablet (20 mg total) by mouth daily. 05/01/18  Yes Angiulli, Lavon Paganini, PA-C  buPROPion [facility] SR) 150 MG 12 hr tablet Take 1 tablet (150 mg total) by mouth 2 (two) times daily. 05/01/18  Yes Angiulli, Lavon Paganini, PA-C  diltiazem (CARDIZEM CD) 120 MG 24 hr capsule Take 1 capsule (120 mg total) by mouth daily. 07/07/18  Yes Rutherford Guys, MD  famotidine (PEPCID) 20 MG tablet Take 1 tablet (20 mg total) by mouth 2 (two) times daily. 06/17/18  Yes Rutherford Guys, MD  ferrous gluconate (FERGON) 324 MG tablet Take 1 tablet (324 mg total) by mouth daily with breakfast. 10/18/18  Yes Rutherford Guys, MD  FLUoxetine (PROZAC) 20 MG capsule TAKE 1 CAPSULE AT BEDTIME 09/14/18  Yes Rutherford Guys, MD  furosemide (LASIX) 20 MG tablet Take 1 tablet by mouth twice a week 11/14/18  Yes Isaiah Serge, NP  hydrALAZINE (APRESOLINE) 50 MG tablet Take 1 tablet (50 mg total) by mouth 3 (three) times daily. 10/13/18  Yes Isaiah Serge, NP  insulin degludec (TRESIBA FLEXTOUCH) 100 UNIT/ML SOPN FlexTouch Pen Inject 0.16 mLs (16 Units total) into the skin daily. 10/20/18  Yes Renato Shin, MD  magnesium oxide (MAG-OX) 400 MG tablet Take 400 mg by mouth 2 (two) times daily.   Yes [provider]  Melatonin 10 MG TABS Take 1 tablet by mouth  at bedtime.   Yes [provider]  metFORMIN (GLUCOPHAGE) 1000 MG tablet Take 1 tablet (1,000 mg total) by mouth daily with breakfast. 06/07/18  Yes Renato Shin, MD  methimazole (TAPAZOLE) 10 MG tablet TAKE 1 TABLET (10 MG TOTAL) BY MOUTH DAILY. 09/25/18  Yes Renato Shin, MD  metoprolol succinate (TOPROL-XL) 25 MG 24 hr tablet Take 25 mg by mouth 2 (two) times daily.   Yes [provider]  mupirocin ointment (BACTROBAN) 2 % Apply 1 application topically 3 (three) times daily. 06/16/18  Yes Rutherford Guys, MD  nitroGLYCERIN (NITROSTAT) 0.4 MG SL tablet Place 0.4 mg under the tongue every 5 (five) minutes as needed.    Yes [provider]  polyethylene glycol powder (GLYCOLAX/MIRALAX) 17 GM/SCOOP powder Take by mouth  as needed.    Yes [provider]  potassium chloride (K-DUR) 10 MEQ tablet Take 1 tablet by mouth twice a week 11/14/18  Yes Isaiah Serge, NP  Respiratory Therapy Supplies DEVI BI-PAP @@ Bedtime   Yes [provider]  traMADol (ULTRAM) 50 MG tablet Take 50 mg by mouth every 6 (six) hours as needed.   Yes [provider]  triamcinolone (KENALOG) 0.1 % paste APPLY TO APHTHEOUS ULCER LESION TWICE DAILY 10/18/18  Yes [provider]  TRUE METRIX BLOOD GLUCOSE TEST test strip TEST BLOOD SUGAR TWICE DAILY 09/16/18  Yes Renato Shin, MD    Past Medical History:  Diagnosis Date  . Abnormal EKG 07/31/2013  . Arthritis    "hands" (03/06/2015)  . Asthma   . Carpal tunnel syndrome, bilateral   . Chronic kidney disease   . Complication of anesthesia    slow to wake up. Pt sts she woke up during surgery many years ago.  . Coronary artery disease     2 v CAD with CTO of the RCA and high grade bifurcational LCx/OM stenosis. S/P PCI DES x 2 to the LCx/OM.  Marland Kitchen Diabetic peripheral neuropathy ([place]) "since 1996"  . GERD (gastroesophageal reflux disease)   . Goiter   . Headache    migraines prior to menopause  . History of shingles  06/01/2013  . Hyperlipidemia LDL goal <70 10/13/2015  . Hypertension   . Hyperthyroidism   . Osteomyelitis of foot (HCC)    Right  . PAF (paroxysmal atrial fibrillation) ([place]) 04/29/2015   CHADS2VASC score of 5 now on Apixaban  . Pneumonia ~ 1976  . Sleep apnea    Bipap  . Tremors of nervous system   . Type II diabetes mellitus (HCC)    insulin dependent    Past Surgical History:  Procedure Laterality Date  . ABDOMINAL HYSTERECTOMY  1988   age 28; CERVICAL DYSPLASIA; ovaries intact.   . AMPUTATION Right 01/23/2016   Procedure: Right 3rd Ray Amputation;  Surgeon: [name], MD;  Location: [place];  Service: Orthopedics;  Laterality: Right;  . AMPUTATION Right 02/13/2016   Procedure: Right Transmetatarsal Amputation;  Surgeon: [name], MD;  Location: [place];  Service: Orthopedics;  Laterality: Right;  . AMPUTATION Right 04/15/2018   Procedure: AMPUTATION BELOW KNEE;  Surgeon: [name], MD;  Location: [place];  Service: Orthopedics;  Laterality: Right;  . BIOPSY  04/16/2018   Procedure: BIOPSY;  Surgeon: Irene Shipper, MD;  Location: [facility] ENDOSCOPY;  Service: Endoscopy;;  . CARDIAC CATHETERIZATION N/A 02/27/2015   Procedure: Left Heart Cath and Coronary Angiography;  Surgeon: [name], MD; LAD 40%, mCFX 80%, OM 70%, RCA 100% calcified       . CARDIAC CATHETERIZATION N/A 03/06/2015   Procedure: Coronary Stent Intervention;  Surgeon: [name], MD;  Location: [place] CV [facility];  Service: Cardiovascular;  Laterality: N/A;  Mid CX 3.50x12 promus DES w/ 0% resdual and Prox OM1 2.50x20 promus DES w/ 20% residual  . CARDIOVERSION    . CARDIOVERSION N/A 05/22/2018   Procedure: CARDIOVERSION;  Surgeon: Skeet Latch, MD;  Location: [place];  Service: Cardiovascular;  Laterality: N/A;  . CARPAL TUNNEL RELEASE Right Nov 2015  . CARPAL TUNNEL RELEASE Right 1992; 05/2014   [name]; [place]  . CESAREAN SECTION  1982; 1984  . ESOPHAGOGASTRODUODENOSCOPY (EGD) WITH PROPOFOL  N/A 04/16/2018   Procedure: ESOPHAGOGASTRODUODENOSCOPY (EGD) WITH PROPOFOL;  Surgeon: Irene Shipper, MD;  Location: [facility]  ENDOSCOPY;  Service: Endoscopy;  Laterality: N/A;  . FOOT NEUROMA SURGERY Bilateral 2000  . I&D EXTREMITY Left 01/06/2018   Procedure: DEBRIDEMENT ULCER LEFT FOOT;  Surgeon: [name], MD;  Location: [place];  Service: Orthopedics;  Laterality: Left;  . KNEE ARTHROSCOPY Right ~ 2003   "meniscus repair"  . SHOULDER OPEN ROTATOR CUFF REPAIR Right 1996; 1998   "w/fracture repair"  . THYROID SURGERY  2000   "removed lots of nodules"  . TONSILLECTOMY  1976    Social History   Tobacco Use  . Smoking status: Former Smoker    Packs/day: 0.00    Years: 41.00    Pack years: 0.00    Types: Cigarettes    Quit date: 03/05/2015    Years since quitting: 3.7  . Smokeless tobacco: Never Used  . Tobacco comment: 04/29/2015 "quit smoking cigarettes 02/27/2015"  Substance Use Topics  . Alcohol use: No    Family History  Problem Relation Age of Onset  . Cancer Mother 69       bronchial cancer  . Breast cancer Mother   . Lung cancer Mother   . Hypertension Father   . COPD Father   . Heart disease Father 15       CAD with cardiac stenting  . Heart attack Father   . Parkinson's disease Father   . Allergies Sister   . Breast cancer Maternal Grandmother   . Emphysema Maternal Grandfather   . Leukemia Paternal Grandmother   . Emphysema Paternal Grandfather   . Thyroid disease Neg Hx     ROS Per hpi  OBJECTIVE:  Today's Vitals   11/17/18 1103  BP: 140/65  Pulse: (!) 57  Temp: 98.5 F (36.9 C)  TempSrc: Oral  SpO2: 96%  Weight: 163 lb (73.9 kg)  Height: 5\' 4"  (1.626 m)   Body mass index is 27.98 kg/m.   Physical Exam Vitals signs and nursing note reviewed.  Constitutional:      Appearance: She is well-developed.  HENT:     Head: Normocephalic and atraumatic.  Eyes:     General: No scleral icterus.    Conjunctiva/sclera: Conjunctivae normal.      Pupils: Pupils are equal, round, and reactive to light.  Neck:     Musculoskeletal: Neck supple.  Pulmonary:     Effort: Pulmonary effort is normal.  Musculoskeletal:     Comments: RLE prosthesis  Skin:    General: Skin is warm and dry.  Neurological:     Mental Status: She is alert and oriented to person, place, and time.     ASSESSMENT and PLAN  1. Leg mass, right - Ambulatory referral to Dermatology  2. Anemia, unspecified type Iron studies normal, MCV wnl. do not resume iron supplement unless instructed by GI based on upcoming egd - CBC  Return for as scheduled.    Rutherford Guys, MD Primary [facility] at [place],  [id number] Ph.  [phone] Fax [phone]

## 2018-11-17 NOTE — Patient Instructions (Signed)
° ° ° °  If you have lab work done today you will be contacted with your lab results within the next 2 weeks.  If you have not heard from us then please contact us. The fastest way to get your results is to register for My Chart. ° ° °IF you received an x-ray today, you will receive an invoice from [facility]. Please contact [facility] at [phone] with questions or concerns regarding your invoice.  ° °IF you received labwork today, you will receive an invoice from [facility]. Please contact [facility] at [phone] with questions or concerns regarding your invoice.  ° °Our billing staff will not be able to assist you with questions regarding bills from these companies. ° °You will be contacted with the lab results as soon as they are available. The fastest way to get your results is to activate your My Chart account. Instructions are located on the last page of this paperwork. If you have not heard from us regarding the results in 2 weeks, please contact this office. °  ° ° ° °

## 2018-11-21 ENCOUNTER — Other Ambulatory Visit: Payer: Self-pay | Admitting: Cardiology

## 2018-11-21 ENCOUNTER — Other Ambulatory Visit: Payer: Self-pay | Admitting: *Deleted

## 2018-11-21 DIAGNOSIS — D508 Other iron deficiency anemias: Secondary | ICD-10-CM

## 2018-11-21 NOTE — Telephone Encounter (Signed)
Age 68, weight 74kg, SCr 1.17 on 11/06/18, last OV same day, afib indication

## 2018-11-22 ENCOUNTER — Telehealth: Payer: Self-pay | Admitting: *Deleted

## 2018-11-22 ENCOUNTER — Encounter: Payer: Self-pay | Admitting: Orthopedic Surgery

## 2018-11-22 NOTE — Telephone Encounter (Signed)
Called patient to inform that we will reschedule the Capsule due to her insurance has not made a decision on whether or not they will approve it, it has to go to a Case manager we rescheduled patient to 7/28  Pt aware

## 2018-11-27 ENCOUNTER — Telehealth: Payer: Self-pay

## 2018-11-27 NOTE — Telephone Encounter (Signed)
Pt sent self addressed envelope with form, form was mailed back today.

## 2018-11-29 ENCOUNTER — Encounter: Payer: Self-pay | Admitting: Family Medicine

## 2018-11-30 ENCOUNTER — Encounter (HOSPITAL_COMMUNITY): Payer: Self-pay

## 2018-11-30 ENCOUNTER — Other Ambulatory Visit: Payer: Self-pay

## 2018-11-30 ENCOUNTER — Emergency Department (HOSPITAL_COMMUNITY)
Admission: EM | Admit: 2018-11-30 | Discharge: 2018-11-30 | Disposition: A | Discharge status: Home / Self Care | Payer: Medicare PPO | Attending: Emergency Medicine | Admitting: Emergency Medicine

## 2018-11-30 ENCOUNTER — Emergency Department (HOSPITAL_COMMUNITY): Payer: Medicare PPO

## 2018-11-30 DIAGNOSIS — E1140 Type 2 diabetes mellitus with diabetic neuropathy, unspecified: Secondary | ICD-10-CM | POA: Diagnosis not present

## 2018-11-30 DIAGNOSIS — Y92013 Bedroom of single-family (private) house as the place of occurrence of the external cause: Secondary | ICD-10-CM | POA: Diagnosis not present

## 2018-11-30 DIAGNOSIS — I5042 Chronic combined systolic (congestive) and diastolic (congestive) heart failure: Secondary | ICD-10-CM | POA: Insufficient documentation

## 2018-11-30 DIAGNOSIS — I130 Hypertensive heart and chronic kidney disease with heart failure and stage 1 through stage 4 chronic kidney disease, or unspecified chronic kidney disease: Secondary | ICD-10-CM | POA: Insufficient documentation

## 2018-11-30 DIAGNOSIS — Z7901 Long term (current) use of anticoagulants: Secondary | ICD-10-CM | POA: Insufficient documentation

## 2018-11-30 DIAGNOSIS — I2510 Atherosclerotic heart disease of native coronary artery without angina pectoris: Secondary | ICD-10-CM | POA: Diagnosis not present

## 2018-11-30 DIAGNOSIS — Z79899 Other long term (current) drug therapy: Secondary | ICD-10-CM | POA: Diagnosis not present

## 2018-11-30 DIAGNOSIS — M542 Cervicalgia: Secondary | ICD-10-CM | POA: Diagnosis not present

## 2018-11-30 DIAGNOSIS — S0990XA Unspecified injury of head, initial encounter: Secondary | ICD-10-CM | POA: Insufficient documentation

## 2018-11-30 DIAGNOSIS — Y998 Other external cause status: Secondary | ICD-10-CM | POA: Diagnosis not present

## 2018-11-30 DIAGNOSIS — J45909 Unspecified asthma, uncomplicated: Secondary | ICD-10-CM | POA: Diagnosis not present

## 2018-11-30 DIAGNOSIS — S40812A Abrasion of left upper arm, initial encounter: Secondary | ICD-10-CM | POA: Insufficient documentation

## 2018-11-30 DIAGNOSIS — S50812A Abrasion of left forearm, initial encounter: Secondary | ICD-10-CM | POA: Diagnosis not present

## 2018-11-30 DIAGNOSIS — Z7984 Long term (current) use of oral hypoglycemic drugs: Secondary | ICD-10-CM | POA: Diagnosis not present

## 2018-11-30 DIAGNOSIS — Z89511 Acquired absence of right leg below knee: Secondary | ICD-10-CM | POA: Diagnosis not present

## 2018-11-30 DIAGNOSIS — D68318 Other hemorrhagic disorder due to intrinsic circulating anticoagulants, antibodies, or inhibitors: Secondary | ICD-10-CM | POA: Diagnosis not present

## 2018-11-30 DIAGNOSIS — R51 Headache: Secondary | ICD-10-CM | POA: Diagnosis not present

## 2018-11-30 DIAGNOSIS — Z87891 Personal history of nicotine dependence: Secondary | ICD-10-CM | POA: Insufficient documentation

## 2018-11-30 DIAGNOSIS — W19XXXA Unspecified fall, initial encounter: Secondary | ICD-10-CM

## 2018-11-30 DIAGNOSIS — W01190A Fall on same level from slipping, tripping and stumbling with subsequent striking against furniture, initial encounter: Secondary | ICD-10-CM | POA: Diagnosis not present

## 2018-11-30 DIAGNOSIS — N183 Chronic kidney disease, stage 3 (moderate): Secondary | ICD-10-CM | POA: Insufficient documentation

## 2018-11-30 DIAGNOSIS — Y9389 Activity, other specified: Secondary | ICD-10-CM | POA: Diagnosis not present

## 2018-11-30 DIAGNOSIS — S199XXA Unspecified injury of neck, initial encounter: Secondary | ICD-10-CM | POA: Diagnosis not present

## 2018-11-30 NOTE — Discharge Instructions (Signed)
1.  You may take Tylenol for pain if needed. 2.  Keep your arm abrasion clean, dry and apply antibiotic ointment. 3.  Return to the emergency department if you develop a bad headache, problems with your vision, nausea and vomiting, confusion, imbalance or any other concerning symptoms.

## 2018-11-30 NOTE — ED Provider Notes (Signed)
[facility] EMERGENCY DEPARTMENT Provider Note   CSN: [id number] Arrival date & time: 11/30/18  1551     History   Chief Complaint Chief Complaint  Patient presents with   Fall   Head Injury    HPI Judith [name] is a 68 y.o. female.     HPI Patient reports that she was getting out of her bed today when she started to slip off of it and could not regain her balance.  She reports that she then fell backwards pretty hard and pushed the bed to the side and hit the left side of her head on her dresser.  She reports she was not knocked out.  She reports she did have a bit of a headache.  She also skinned her left arm a little bit but has applied a dressing already.  She reports that she was concerned because she did develop a knot on the side of her head and she is on Eliquis.  She reports otherwise she has been feeling well.  She has not felt sick.  He has not had any problems with her vision no nausea no vomiting.  No confusion or incoordination. Past Medical History:  Diagnosis Date   Abnormal EKG 07/31/2013   Arthritis    "hands" (03/06/2015)   Asthma    Carpal tunnel syndrome, bilateral    Chronic kidney disease    Complication of anesthesia    slow to wake up. Pt sts she woke up during surgery many years ago.   Coronary artery disease     2 v CAD with CTO of the RCA and high grade bifurcational LCx/OM stenosis. S/P PCI DES x 2 to the LCx/OM.   Diabetic peripheral neuropathy ([place]) "since 1996"   GERD (gastroesophageal reflux disease)    Goiter    Headache    migraines prior to menopause   History of shingles 06/01/2013   Hyperlipidemia LDL goal <70 10/13/2015   Hypertension    Hyperthyroidism    Osteomyelitis of foot (HCC)    Right   PAF (paroxysmal atrial fibrillation) ([place]) 04/29/2015   CHADS2VASC score of 5 now on Apixaban   Pneumonia ~ 1976   Sleep apnea    Bipap   Tremors of nervous system    Type II diabetes mellitus  (HCC)    insulin dependent    Patient Active Problem List   Diagnosis Date Noted   Diarrhea 09/05/2018   Urinary tract infection 06/30/2018   ICH (intracerebral hemorrhage) ([place]) 06/27/2018   DKA (diabetic ketoacidoses) ([place]) 06/26/2018   V-tach ([place]) 05/14/2018   Seizure ([place]) 05/14/2018   Slow transit constipation    Fall    Labile blood glucose    Labile blood pressure    Chronic combined systolic and diastolic congestive heart failure (HCC)    Chronic diastolic congestive heart failure (HCC)    CKD (chronic kidney disease), stage III ([place])    Type 2 diabetes mellitus with peripheral neuropathy ([place])    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   Unilateral traumatic amputation of right leg below knee with complication, initial encounter (HCC)    Post-operative pain    PAF (paroxysmal atrial fibrillation) ([place])    Duodenal ulcer    Atrial fibrillation with RVR ([place]) 04/14/2018   Foot osteomyelitis, right ([place]) 04/14/2018   Cutaneous abscess of left foot    Acute respiratory failure with hypoxia ([place])    Cellulitis 01/02/2018   LGI bleed  Acute blood loss anemia    Wide-complex tachycardia (HCC) 07/04/2017   Type II diabetes mellitus, uncontrolled ([place]) 07/04/2017   Peripheral neuropathy 07/04/2017   H/O hyperthyroidism 07/04/2017   Atrial fibrillation with rapid ventricular response ([place]) 08/06/2016   S/P transmetatarsal amputation of foot, right ([place]) 04/19/2016   Obstructive sleep apnea 11/26/2015   Bilateral carpal tunnel syndrome 11/26/2015   Hypomagnesemia 11/16/2015   Hyperlipidemia LDL goal <70 10/13/2015   Abnormality of gait 09/02/2015   Memory loss 08/12/2015   Diabetic peripheral neuropathy ([place]) 08/12/2015   Vitamin D deficiency 08/12/2015   Hyperthyroidism 04/30/2015   Heme positive stool    Persistent atrial fibrillation 04/29/2015   Coronary artery disease with stable angina pectoris  ([place]) 03/29/2015   Abnormal nuclear stress test    History of goiter 09/28/2014   GERD (gastroesophageal reflux disease) 07/31/2013   Depression with anxiety 06/01/2013   DM (diabetes mellitus), type 2 with peripheral vascular complications ([place]) 66/44/0347   HTN (hypertension) 01/30/2013    Past Surgical History:  Procedure Laterality Date   ABDOMINAL HYSTERECTOMY  1988   age 42; CERVICAL DYSPLASIA; ovaries intact.    AMPUTATION Right 01/23/2016   Procedure: Right 3rd Ray Amputation;  Surgeon: [name], MD;  Location: [facility];  Service: Orthopedics;  Laterality: Right;   AMPUTATION Right 02/13/2016   Procedure: Right Transmetatarsal Amputation;  Surgeon: [name], MD;  Location: [facility];  Service: Orthopedics;  Laterality: Right;   AMPUTATION Right 04/15/2018   Procedure: AMPUTATION BELOW KNEE;  Surgeon: [name], MD;  Location: [facility];  Service: Orthopedics;  Laterality: Right;   BIOPSY  04/16/2018   Procedure: BIOPSY;  Surgeon: Irene Shipper, MD;  Location: [facility];  Service: Endoscopy;;   CARDIAC CATHETERIZATION N/A 02/27/2015   Procedure: Left Heart Cath and Coronary Angiography;  Surgeon: [name], MD; LAD 40%, mCFX 80%, OM 70%, RCA 100% calcified        CARDIAC CATHETERIZATION N/A 03/06/2015   Procedure: Coronary Stent Intervention;  Surgeon: [name], MD;  Location: [facility];  Service: Cardiovascular;  Laterality: N/A;  Mid CX 3.50x12 promus DES w/ 0% resdual and Prox OM1 2.50x20 promus DES w/ 20% residual   CARDIOVERSION     CARDIOVERSION N/A 05/22/2018   Procedure: CARDIOVERSION;  Surgeon: Skeet Latch, MD;  Location: [facility];  Service: Cardiovascular;  Laterality: N/A;   CARPAL TUNNEL RELEASE Right Nov 2015   CARPAL TUNNEL RELEASE Right 1992; 05/2014   [name]; [place]; 1984   ESOPHAGOGASTRODUODENOSCOPY (EGD) WITH PROPOFOL N/A 04/16/2018   Procedure: ESOPHAGOGASTRODUODENOSCOPY (EGD)  WITH PROPOFOL;  Surgeon: Irene Shipper, MD;  Location: [facility];  Service: Endoscopy;  Laterality: N/A;   FOOT NEUROMA SURGERY Bilateral 2000   I&D EXTREMITY Left 01/06/2018   Procedure: DEBRIDEMENT ULCER LEFT FOOT;  Surgeon: [name], MD;  Location: [facility];  Service: Orthopedics;  Laterality: Left;   KNEE ARTHROSCOPY Right ~ 2003   "meniscus repair"   SHOULDER OPEN ROTATOR CUFF REPAIR Right 1996; 1998   "w/fracture repair"   THYROID SURGERY  2000   "removed lots of nodules"   TONSILLECTOMY  1976     OB History   No obstetric history on file.      Home Medications    Prior to Admission medications   Medication Sig Start Date End Date Taking? Authorizing Provider  acetaminophen (TYLENOL) 325 MG tablet Take 1-2 tablets (325-650 mg total) by mouth every  6 (six) hours as needed for mild pain (pain score 1-3 or temp > 100.5). 05/01/18   Angiulli, Lavon Paganini, PA-C  albuterol (PROVENTIL HFA) 108 (90 Base) MCG/ACT inhaler Inhale 1-2 puffs into the lungs every 6 (six) hours as needed for wheezing or shortness of breath. 05/01/18   Angiulli, Lavon Paganini, PA-C  amiodarone (PACERONE) 200 MG tablet Take 1 tablet (200 mg total) by mouth daily. 06/08/18   Sherran Needs, NP  atorvastatin (LIPITOR) 20 MG tablet Take 1 tablet (20 mg total) by mouth daily. 05/01/18   Angiulli, Lavon Paganini, PA-C  buPROPion (WELLBUTRIN SR) 150 MG 12 hr tablet Take 1 tablet (150 mg total) by mouth 2 (two) times daily. 05/01/18   Angiulli, Lavon Paganini, PA-C  diltiazem (CARDIZEM CD) 120 MG 24 hr capsule Take 1 capsule (120 mg total) by mouth daily. 07/07/18   Rutherford Guys, MD  ELIQUIS 5 MG TABS tablet Take 1 tablet by mouth twice daily 11/21/18   Sueanne Margarita, MD  famotidine (PEPCID) 20 MG tablet Take 1 tablet (20 mg total) by mouth 2 (two) times daily. 06/17/18   Rutherford Guys, MD  FLUoxetine (PROZAC) 20 MG capsule TAKE 1 CAPSULE AT BEDTIME 09/14/18   Rutherford Guys, MD  furosemide (LASIX) 20 MG tablet Take 1  tablet by mouth twice a week 11/14/18   Isaiah Serge, NP  hydrALAZINE (APRESOLINE) 50 MG tablet Take 1 tablet (50 mg total) by mouth 3 (three) times daily. 10/13/18   Isaiah Serge, NP  insulin degludec (TRESIBA FLEXTOUCH) 100 UNIT/ML SOPN FlexTouch Pen Inject 0.16 mLs (16 Units total) into the skin daily. 10/20/18   Renato Shin, MD  magnesium oxide (MAG-OX) 400 MG tablet Take 400 mg by mouth 2 (two) times daily.    [provider]  Melatonin 10 MG TABS Take 1 tablet by mouth at bedtime.    [provider]  metFORMIN (GLUCOPHAGE) 1000 MG tablet Take 1 tablet (1,000 mg total) by mouth daily with breakfast. 06/07/18   Renato Shin, MD  methimazole (TAPAZOLE) 10 MG tablet TAKE 1 TABLET (10 MG TOTAL) BY MOUTH DAILY. 09/25/18   Renato Shin, MD  metoprolol succinate (TOPROL-XL) 25 MG 24 hr tablet Take 25 mg by mouth 2 (two) times daily.    [provider]  mupirocin ointment (BACTROBAN) 2 % Apply 1 application topically 3 (three) times daily. 06/16/18   Rutherford Guys, MD  nitroGLYCERIN (NITROSTAT) 0.4 MG SL tablet Place 0.4 mg under the tongue every 5 (five) minutes as needed.     [provider]  polyethylene glycol powder (GLYCOLAX/MIRALAX) 17 GM/SCOOP powder Take by mouth as needed.     [provider]  potassium chloride (K-DUR) 10 MEQ tablet Take 1 tablet by mouth twice a week 11/14/18   Isaiah Serge, NP  Respiratory Therapy Supplies DEVI BI-PAP @@ Bedtime    [provider]  traMADol (ULTRAM) 50 MG tablet Take 50 mg by mouth every 6 (six) hours as needed.    [provider]  triamcinolone (KENALOG) 0.1 % paste APPLY TO APHTHEOUS ULCER LESION TWICE DAILY 10/18/18   [provider]  TRUE METRIX BLOOD GLUCOSE TEST test strip TEST BLOOD SUGAR TWICE DAILY 09/16/18   Renato Shin, MD    Family History Family History  Problem Relation Age of Onset   Cancer Mother 13       bronchial cancer   Breast cancer Mother    Lung  cancer Mother  Hypertension Father    COPD Father    Heart disease Father 55       CAD with cardiac stenting   Heart attack Father    Parkinson's disease Father    Allergies Sister    Breast cancer Maternal Grandmother    Emphysema Maternal Grandfather    Leukemia Paternal Grandmother    Emphysema Paternal Grandfather    Thyroid disease Neg Hx     Social History Social History   Tobacco Use   Smoking status: Former Smoker    Packs/day: 0.00    Years: 41.00    Pack years: 0.00    Types: Cigarettes    Quit date: 03/05/2015    Years since quitting: 3.7   Smokeless tobacco: Never Used   Tobacco comment: 04/29/2015 "quit smoking cigarettes 02/27/2015"  Substance Use Topics   Alcohol use: No   Drug use: No     Allergies   Contrast media [iodinated diagnostic agents], Novolog [insulin aspart], Codeine, Iodine, Penicillins, Ace inhibitors, Demerol [meperidine], Dilaudid [hydromorphone hcl], Neosporin [neomycin-bacitracin zn-polymyx], Percocet [oxycodone-acetaminophen], and Tape   Review of Systems Review of Systems 10 Systems reviewed and are negative for acute change except as noted in the HPI.   Physical Exam Updated Vital Signs BP (!) 159/68 (BP Location: Left Arm)    Pulse (!) 56    Temp 98.8 F (37.1 C) (Oral)    Resp 14    SpO2 100%   Physical Exam Constitutional:      Comments: Alert and nontoxic.  Normal mental status.  Cheerful demeanor.  GCS 15.  HENT:     Head:     Comments: Patient has a approximately 3 and half centimeter hematoma to the left parietal scalp.  No laceration.  Otherwise no facial contusion abrasion. Eyes:     Extraocular Movements: Extraocular movements intact.     Conjunctiva/sclera: Conjunctivae normal.  Neck:     Comments: No midline C-spine tenderness to palpation.  General tenderness to the paraspinous muscle bodies. Cardiovascular:     Comments: Heart regular.  3\6 systolic ejection murmur. Pulmonary:      Effort: Pulmonary effort is normal.     Breath sounds: Normal breath sounds.  Chest:     Chest wall: No tenderness.  Abdominal:     General: There is no distension.     Palpations: Abdomen is soft.     Tenderness: There is no abdominal tenderness. There is no guarding.  Musculoskeletal:     Comments: Patient has a superficial skin tear abrasion to the left forearm approximately 1.5 cm.  No active bleeding.  Left lower limb amputation.  Normal range of motion right lower extremity without any joint effusions abrasions or difficulty with range of motion.  Skin:    General: Skin is warm and dry.  Neurological:     General: No focal deficit present.     Mental Status: She is oriented to person, place, and time.     Coordination: Coordination normal.  Psychiatric:        Mood and Affect: Mood normal.      ED Treatments / Results  Labs (all labs ordered are listed, but only abnormal results are displayed) Labs Reviewed - No data to display  EKG None  Radiology Ct Head Wo Contrast  Result Date: 11/30/2018 CLINICAL DATA:  Recent fall with headaches and neck pain, initial encounter EXAM: CT HEAD WITHOUT CONTRAST CT CERVICAL SPINE WITHOUT CONTRAST TECHNIQUE: Multidetector CT imaging of the head and cervical spine  was performed following the standard protocol without intravenous contrast. Multiplanar CT image reconstructions of the cervical spine were also generated. COMPARISON:  05/14/2018 FINDINGS: CT HEAD FINDINGS Brain: Mild atrophic changes are noted. No findings to suggest acute hemorrhage, acute infarction or space-occupying mass lesion are seen. Dystrophic calcifications are again noted along the posterior aspect of the thalamus on the left stable from the prior study. Vascular: No hyperdense vessel or unexpected calcification. Skull: Normal. Negative for fracture or focal lesion. Sinuses/Orbits: No acute finding. Other: None. CT CERVICAL SPINE FINDINGS Alignment: Within normal limits.  Skull base and vertebrae: 7 cervical segments are well visualized. Vertebral body height is well maintained. No acute fracture or acute facet abnormality is noted. Mild facet hypertrophic changes are seen particularly on the left. Soft tissues and spinal canal: Surrounding soft tissue structures demonstrate enlargement of the right lobe of the thyroid and to a lesser degree in the left lobe of the thyroid with scattered calcifications likely related to a multinodular goiter. No other significant soft tissue abnormality is seen. Upper chest: Visualized lung apices are within normal limits. Other: None IMPRESSION: CT of the head: Chronic dystrophic calcification in the left thalamus. No acute abnormality is noted. CT of the cervical spine: Multilevel degenerative change without acute abnormality. Multinodular goiter stable in appearance from 2017. Electronically Signed   By: Inez Catalina M.D.   On: 11/30/2018 17:55   Ct Cervical Spine Wo Contrast  Result Date: 11/30/2018 CLINICAL DATA:  Recent fall with headaches and neck pain, initial encounter EXAM: CT HEAD WITHOUT CONTRAST CT CERVICAL SPINE WITHOUT CONTRAST TECHNIQUE: Multidetector CT imaging of the head and cervical spine was performed following the standard protocol without intravenous contrast. Multiplanar CT image reconstructions of the cervical spine were also generated. COMPARISON:  05/14/2018 FINDINGS: CT HEAD FINDINGS Brain: Mild atrophic changes are noted. No findings to suggest acute hemorrhage, acute infarction or space-occupying mass lesion are seen. Dystrophic calcifications are again noted along the posterior aspect of the thalamus on the left stable from the prior study. Vascular: No hyperdense vessel or unexpected calcification. Skull: Normal. Negative for fracture or focal lesion. Sinuses/Orbits: No acute finding. Other: None. CT CERVICAL SPINE FINDINGS Alignment: Within normal limits. Skull base and vertebrae: 7 cervical segments are well  visualized. Vertebral body height is well maintained. No acute fracture or acute facet abnormality is noted. Mild facet hypertrophic changes are seen particularly on the left. Soft tissues and spinal canal: Surrounding soft tissue structures demonstrate enlargement of the right lobe of the thyroid and to a lesser degree in the left lobe of the thyroid with scattered calcifications likely related to a multinodular goiter. No other significant soft tissue abnormality is seen. Upper chest: Visualized lung apices are within normal limits. Other: None IMPRESSION: CT of the head: Chronic dystrophic calcification in the left thalamus. No acute abnormality is noted. CT of the cervical spine: Multilevel degenerative change without acute abnormality. Multinodular goiter stable in appearance from 2017. Electronically Signed   By: Inez Catalina M.D.   On: 11/30/2018 17:55    Procedures Procedures (including critical care time)  Medications Ordered in ED Medications - No data to display   Initial Impression / Assessment and Plan / ED Course  I have reviewed the triage vital signs and the nursing notes.  Pertinent labs & imaging results that were available during my care of the patient were reviewed by me and considered in my medical decision making (see chart for details).  Patient took a mechanical fall earlier today and is anticoagulated.  No neurologic deficits.  Mental status clear.  CT negative for head and neck.  Return precautions reviewed.  Patient discharged in good condition.  Final Clinical Impressions(s) / ED Diagnoses   Final diagnoses:  Fall, initial encounter  Injury of head, initial encounter  Anticoagulated  Arm abrasion, left, initial encounter    ED Discharge Orders    None       Charlesetta Shanks, MD 11/30/18 2054

## 2018-11-30 NOTE — ED Triage Notes (Signed)
Pt reports mechanical fall today, pt hit the back of her head, taking eliquis. Pt denies LOC, c.o head and neck pain, soft collar applied in trage. Small abrasion to left forearm, covered with gauze.

## 2018-12-05 ENCOUNTER — Ambulatory Visit (INDEPENDENT_AMBULATORY_CARE_PROVIDER_SITE_OTHER): Payer: Medicare PPO | Admitting: Podiatry

## 2018-12-05 ENCOUNTER — Other Ambulatory Visit: Payer: Self-pay

## 2018-12-05 ENCOUNTER — Encounter: Payer: Self-pay | Admitting: Podiatry

## 2018-12-05 VITALS — Temp 97.7°F

## 2018-12-05 DIAGNOSIS — B351 Tinea unguium: Secondary | ICD-10-CM

## 2018-12-05 DIAGNOSIS — L84 Corns and callosities: Secondary | ICD-10-CM | POA: Diagnosis not present

## 2018-12-05 DIAGNOSIS — M79675 Pain in left toe(s): Secondary | ICD-10-CM | POA: Diagnosis not present

## 2018-12-05 DIAGNOSIS — M79674 Pain in right toe(s): Secondary | ICD-10-CM | POA: Diagnosis not present

## 2018-12-05 DIAGNOSIS — E1149 Type 2 diabetes mellitus with other diabetic neurological complication: Secondary | ICD-10-CM | POA: Diagnosis not present

## 2018-12-06 ENCOUNTER — Telehealth: Payer: Self-pay | Admitting: *Deleted

## 2018-12-06 DIAGNOSIS — D1724 Benign lipomatous neoplasm of skin and subcutaneous tissue of left leg: Secondary | ICD-10-CM | POA: Diagnosis not present

## 2018-12-06 DIAGNOSIS — D1723 Benign lipomatous neoplasm of skin and subcutaneous tissue of right leg: Secondary | ICD-10-CM | POA: Diagnosis not present

## 2018-12-06 DIAGNOSIS — D485 Neoplasm of uncertain behavior of skin: Secondary | ICD-10-CM | POA: Diagnosis not present

## 2018-12-06 NOTE — Progress Notes (Signed)
Subjective: 68 y.o. returns the office today for painful, elongated, thickened toenails which she cannot trim herself on the left foot. She has no new concerns. Denies any redness or drainage around the nails. Denies any acute changes to her lower extremities since last appointment and no new complaints today. Denies any systemic complaints such as fevers, chills, nausea, vomiting.   PCP: Rutherford Guys, MD  Objective: AAO 3, NAD Neurovascular status unchanged Nails hypertrophic, dystrophic, elongated, brittle, discolored 5. There is tenderness overlying the nails 1-5 on the left. There is no surrounding erythema or drainage along the nail sites. Small callus lateral 4th toe. No open wound or signs of infection. No swelling No open lesions or pre-ulcerative lesions are identified. No other areas of tenderness. No overlying edema, erythema, increased warmth. No pain with calf compression, swelling, warmth, erythema.  Assessment: Patient presents with symptomatic onychomycosis  Plan: -Treatment options including alternatives, risks, complications were discussed -Nails sharply debrided 5 without complication/bleeding. Debrided the small callus to the 4th toe without complications/bleeding. No signs of infection.  -Discussed daily foot inspection. If there are any changes, to call the office immediately.  -Follow-up in 3 months or sooner if any problems are to arise. In the meantime, encouraged to call the office with any questions, concerns, changes symptoms.  Celesta Gentile, DPM

## 2018-12-06 NOTE — Telephone Encounter (Signed)
Ok Ill try to contact them to schedule an appointment for peer to peer  ===View-only below this line=== ----- Message ----- From: Ronalee Red I Sent: 12/06/2018  12:06 PM EDT To: Oda Kilts, CMA Subject: RE: Capsule endo on Friday                     case [id number] faxed reccords to [phone]    ----- Message ----- From: Oda Kilts, CMA Sent: 12/06/2018  11:23 AM EDT To: [place] Subject: RE: Capsule endo on Friday                     Can I get the reference number to this case? They want the ref number so I can set up this appointement  Thank You      ----- Message ----- From: Mauri Pole, MD Sent: 12/04/2018  12:02 PM EDT To: Larina Bras, CMA, Oda Kilts, CMA Subject: RE: Capsule endo on Friday                     The extension doesn't work again, this time I was able to get in touch with one of the admin's on the main line.  Greysen Devino, I had previously requested to obtain the member case ID/reference number, they will not be able to do anything if I cant provide that information. There was no scheduled peer to peer. Please reschedule the procedure until this can be sorted Thanks VN ----- Message ----- From: Larina Bras, CMA Sent: 12/01/2018  12:10 PM EDT To: Mauri Pole, MD Subject: FW: Capsule endo on Friday                      ----- Message ----- From: Ronalee Red I Sent: 11/30/2018  11:37 AM EDT To: Oda Kilts, CMA Subject: RE: Capsule endo on Friday                     Hey Tynleigh Birt this case was not approved but they offered to do a peer to peer  With a doctor, PA or NP it needs to be done before Monday at 1pm call [phone] x [phone]  ----- Message ----- From: Oda Kilts, CMA Sent: 11/22/2018   2:52 PM EDT To: [place] Slider Subject: RE: Capsule endo on Friday                     Ok  :) ----- Message ----- From: Ronalee Red I Sent: 11/22/2018   2:32 PM  EDT To: Oda Kilts, CMA Subject: RE: Capsule endo on Friday                     Im working on it on the phone with nurse ----- Message ----- From: Oda Kilts, CMA Sent: 11/22/2018  12:32 PM EDT To: [place] Slider Subject: RE: Capsule endo on Friday                     Dont want to worry you but have you heard anything? Just need to know because she will start prepping tomorrow  Thank You ----- Message ----- From: Ronalee Red I Sent: 11/21/2018   3:17 PM EDT To: Oda Kilts, CMA Subject: RE: Capsule endo on Friday  Of coarse  ----- Message ----- From: Oda Kilts, CMA Sent: 11/21/2018   3:02 PM EDT To: [place] Slider Subject: RE: Capsule endo on Friday                     Will you let me know if you hear anything? In case I need to cancel her  Thank You  ----- Message ----- From: Ronalee Red I Sent: 11/21/2018  12:00 PM EDT To: Oda Kilts, CMA Subject: RE: Capsule endo on Friday                     I faxed records I don't know if I'll have a respond by Friday  I did tell them that she's scheduled for this Friday ----- Message ----- From: Oda Kilts, CMA Sent: 11/21/2018  10:27 AM EDT To: [place] Slider Subject: Capsule endo on Friday                         Hey this patient needs a auth on this capsule  its scheduled for Friday   I forgot to do the referral  Can you do this in time?? Or should I cancel her   Thanks         Dr Silverio Decamp, I called to set up the Peer to Peer  They said that the Peer to Peer was completed on 12/04/2018 and it was denied.......Marland KitchenMarland KitchenThey are saying the capsule is not medically necessary I tried to explain to them that you have not done the Peer to Peer

## 2018-12-07 ENCOUNTER — Telehealth: Payer: Self-pay | Admitting: *Deleted

## 2018-12-07 NOTE — Telephone Encounter (Signed)
Ok thanks 

## 2018-12-07 NOTE — Telephone Encounter (Signed)
Capsule cancelled  Scheduled pt for an office appointment

## 2018-12-07 NOTE — Telephone Encounter (Signed)
Per Dr Silverio Decamp schedule pt for an in office appointment for anemia   Capsule denied by insurance

## 2018-12-07 NOTE — Telephone Encounter (Signed)
I called to set up Peer to peer, they are saying the Peer to peer was already done by Dr Silverio Decamp on the 27th, I tried to explain to them it has not been done but they insisted it had....Marland KitchenMarland KitchenDr Silverio Decamp says she has not done the Peer to Peer yet..   I cant get anything out of this insurance co

## 2018-12-10 ENCOUNTER — Other Ambulatory Visit: Payer: Self-pay | Admitting: Endocrinology

## 2018-12-11 DIAGNOSIS — D1723 Benign lipomatous neoplasm of skin and subcutaneous tissue of right leg: Secondary | ICD-10-CM | POA: Diagnosis not present

## 2018-12-29 ENCOUNTER — Other Ambulatory Visit: Payer: Self-pay

## 2018-12-29 ENCOUNTER — Ambulatory Visit (INDEPENDENT_AMBULATORY_CARE_PROVIDER_SITE_OTHER): Payer: Medicare PPO | Admitting: Family Medicine

## 2018-12-29 DIAGNOSIS — Z23 Encounter for immunization: Secondary | ICD-10-CM | POA: Diagnosis not present

## 2018-12-29 MED ORDER — ZOSTER VAC RECOMB ADJUVANTED 50 MCG/0.5ML IM SUSR: 0.5000 mL | Freq: Once | INTRAMUSCULAR | 1 refills | Status: AC

## 2018-12-29 NOTE — Addendum Note (Signed)
Addended by: Alfredia Ferguson A on: 12/29/2018 10:44 AM   Modules accepted: Orders

## 2018-12-29 NOTE — Addendum Note (Signed)
Addended by: Alfredia Ferguson A on: 12/29/2018 02:59 PM   Modules accepted: Orders

## 2018-12-29 NOTE — Progress Notes (Signed)
[email or web address]

## 2019-01-02 ENCOUNTER — Other Ambulatory Visit: Payer: Self-pay | Admitting: Physician Assistant

## 2019-01-02 ENCOUNTER — Encounter: Payer: Self-pay | Admitting: Orthopedic Surgery

## 2019-01-02 ENCOUNTER — Other Ambulatory Visit: Payer: Self-pay | Admitting: Cardiology

## 2019-01-05 ENCOUNTER — Other Ambulatory Visit: Payer: Self-pay | Admitting: Physician Assistant

## 2019-01-05 ENCOUNTER — Telehealth: Payer: Self-pay | Admitting: Cardiology

## 2019-01-05 NOTE — Telephone Encounter (Signed)
Requested medication (s) are due for refill today: yes  Requested medication (s) are on the active medication list: yes  Last refill:  no  Future visit scheduled: yes  Notes to clinic:  Review for refill  Requested Prescriptions  Pending Prescriptions Disp Refills   cephALEXin (KEFLEX) 500 MG capsule [Pharmacy Med Name: Cephalexin 500 MG Oral Capsule] 30 capsule 0    Sig: TAKE 1 CAPSULE BY MOUTH THREE TIMES DAILY     Off-Protocol Failed - 01/05/2019 12:12 PM      Failed - Medication not assigned to a protocol, review manually.      Passed - Valid encounter within last 12 months    Recent Outpatient Visits          1 week ago Need for prophylactic vaccination and inoculation against varicella   [facility], Lilia Argue, MD   1 month ago Leg mass, right   [facility], Lilia Argue, MD   2 months ago Anemia, unspecified type   [facility], Lilia Argue, MD   5 months ago Essential hypertension   [facility] at [facility], Lilia Argue, MD   6 months ago Left-sided nontraumatic intracerebral hemorrhage, unspecified cerebral location [facility])   [facility] at [facility], Lilia Argue, MD      Future Appointments            In 3 days [place], [place], [place], [place]   In 4 days Suzan Slick, NP [place]   In 1 week Rutherford Guys, MD [facility] at [place]

## 2019-01-05 NOTE — Telephone Encounter (Signed)
New message   Per a my chart message that was received the patient states that she is retaining fluid and having difficulty breathing. Please contact the patient.

## 2019-01-05 NOTE — Telephone Encounter (Signed)
Agree with plan but needs to go to ER if sx worsen

## 2019-01-05 NOTE — Telephone Encounter (Signed)
Pt agrees to go to the ER if her symptoms change or worsen over the weekend. Will keep 01/08/19 appt.

## 2019-01-05 NOTE — Telephone Encounter (Addendum)
Spoke with the pt today and she is reporting increased SOB with minimal exertion.. she has ben waking up in the middle of the night not being able to breathe... the last 6 nights she has woke up and took a Lasix and used her inhaler for relief.   She has only one lower extremity but says it does not appear swollen and neither are her hands. She has gained 4 lbs in one day. No added abdominal distention.   She has not been watching her NA intake well but not any different than she has over the past several months.   Her Lasix was originally 20mg  PRN about 3X;s per week but she has used it every night for the past 6 nights with her K for relief so she can sleep.   She is asking if she should make an appt next week. I advised her I need to have an APP look at her message for review and I will call back with recommendations. Pt agreed.   Last OV with Cecilie Kicks NP 11/06/18 with labs.   Addendum... Appt made for the pt with Robbie Lis PA 01/08/19 for 3pm.. she will call Dr. Pamella Pert her PCP to be sure she does not have something else going on... she says she has been diagnosed with COPD....she will call or go to the ER if anything worsens prior to her appt.. pt to watch her NA intake. She denies fever, chest pain, dizziness, no cough.            COVID-19 Pre-Screening Questions:  . In the past 7 to 10 days have you had a cough,  shortness of breath, headache, congestion, fever (100 or greater) body aches, chills, sore throat, or sudden loss of taste or sense of smell? NO . Have you been around anyone with known Covid 19 .NO . Have you been around anyone who is awaiting Covid 19 test results in the past 7 to 10 days? NO . Have you been around anyone who has been exposed to Covid 19, or has mentioned symptoms of Covid 19 within the past 7 to 10 days? NO  If you have any concerns/questions about symptoms patients report during screening (either on the phone or at threshold). Contact the provider  seeing the patient or DOD for further guidance.  If neither are available contact a member of the leadership team.

## 2019-01-08 ENCOUNTER — Ambulatory Visit (INDEPENDENT_AMBULATORY_CARE_PROVIDER_SITE_OTHER): Payer: Medicare PPO | Admitting: Physician Assistant

## 2019-01-08 ENCOUNTER — Encounter: Payer: Self-pay | Admitting: Physician Assistant

## 2019-01-08 ENCOUNTER — Ambulatory Visit (INDEPENDENT_AMBULATORY_CARE_PROVIDER_SITE_OTHER): Payer: Medicare PPO | Admitting: Gastroenterology

## 2019-01-08 ENCOUNTER — Other Ambulatory Visit (INDEPENDENT_AMBULATORY_CARE_PROVIDER_SITE_OTHER): Payer: Medicare PPO

## 2019-01-08 ENCOUNTER — Other Ambulatory Visit: Payer: Self-pay

## 2019-01-08 ENCOUNTER — Telehealth: Payer: Self-pay | Admitting: *Deleted

## 2019-01-08 ENCOUNTER — Encounter: Payer: Self-pay | Admitting: Gastroenterology

## 2019-01-08 VITALS — BP 122/68 | HR 116 | Ht 64.0 in | Wt 173.4 lb

## 2019-01-08 VITALS — BP 120/64 | HR 104 | Temp 96.4°F | Ht 64.0 in | Wt 172.6 lb

## 2019-01-08 DIAGNOSIS — K209 Esophagitis, unspecified without bleeding: Secondary | ICD-10-CM

## 2019-01-08 DIAGNOSIS — K2100 Gastro-esophageal reflux disease with esophagitis, without bleeding: Secondary | ICD-10-CM

## 2019-01-08 DIAGNOSIS — K269 Duodenal ulcer, unspecified as acute or chronic, without hemorrhage or perforation: Secondary | ICD-10-CM | POA: Diagnosis not present

## 2019-01-08 DIAGNOSIS — D508 Other iron deficiency anemias: Secondary | ICD-10-CM

## 2019-01-08 DIAGNOSIS — I25118 Atherosclerotic heart disease of native coronary artery with other forms of angina pectoris: Secondary | ICD-10-CM | POA: Diagnosis not present

## 2019-01-08 DIAGNOSIS — I10 Essential (primary) hypertension: Secondary | ICD-10-CM

## 2019-01-08 DIAGNOSIS — I480 Paroxysmal atrial fibrillation: Secondary | ICD-10-CM

## 2019-01-08 DIAGNOSIS — I350 Nonrheumatic aortic (valve) stenosis: Secondary | ICD-10-CM

## 2019-01-08 DIAGNOSIS — R1013 Epigastric pain: Secondary | ICD-10-CM

## 2019-01-08 DIAGNOSIS — D500 Iron deficiency anemia secondary to blood loss (chronic): Secondary | ICD-10-CM | POA: Diagnosis not present

## 2019-01-08 DIAGNOSIS — R0601 Orthopnea: Secondary | ICD-10-CM

## 2019-01-08 LAB — CBC WITH DIFFERENTIAL/PLATELET
Basophils Absolute: 0.1 10*3/uL (ref 0.0–0.1)
Basophils Relative: 0.8 % (ref 0.0–3.0)
Eosinophils Absolute: 0.2 10*3/uL (ref 0.0–0.7)
Eosinophils Relative: 3 % (ref 0.0–5.0)
HCT: 30.7 % — ABNORMAL LOW (ref 36.0–46.0)
Hemoglobin: 10.1 g/dL — ABNORMAL LOW (ref 12.0–15.0)
Lymphocytes Relative: 17.4 % (ref 12.0–46.0)
Lymphs Abs: 1.3 10*3/uL (ref 0.7–4.0)
MCHC: 32.9 g/dL (ref 30.0–36.0)
MCV: 86.2 fl (ref 78.0–100.0)
Monocytes Absolute: 0.7 10*3/uL (ref 0.1–1.0)
Monocytes Relative: 9 % (ref 3.0–12.0)
Neutro Abs: 5.2 10*3/uL (ref 1.4–7.7)
Neutrophils Relative %: 69.8 % (ref 43.0–77.0)
Platelets: 366 10*3/uL (ref 150.0–400.0)
RBC: 3.55 Mil/uL — ABNORMAL LOW (ref 3.87–5.11)
RDW: 15.8 % — ABNORMAL HIGH (ref 11.5–15.5)
WBC: 7.4 10*3/uL (ref 4.0–10.5)

## 2019-01-08 MED ORDER — SUCRALFATE 1 G PO TABS: 1.0000 g | ORAL_TABLET | Freq: Three times a day (TID) | ORAL | 1 refills | Status: DC

## 2019-01-08 NOTE — Progress Notes (Signed)
Judith [name]    XR:[id number]    February 01, 1951  Primary Care Physician:Santiago, Lilia Argue, MD  Referring Physician: Rutherford Guys, MD [address] [place],  [place] [address]   Chief complaint: GERD, iron deficiency anemia  HPI:  68 year old female with hypertension, diabetes, CAD, A. fib on Eliquis, obstructive sleep apnea and chronic GERD with complaints of worsening GERD symptoms She had medication changes after she was discharged from hospitalization in March 2020, was taken off Protonix because of hypomagnesemia.  She was taking famotidine once a day, increase to twice a day with no improvement of GERD symptoms. She continues to have persistent anemia with hemoglobin at 11 Denies any melena or blood per rectum.  No change in bowel habits.  No nausea or vomiting.  She has epigastric abdominal discomfort along with regurgitation and burning sensation in the throat worse when she lays down at bedtime. Capsule endoscopy was denied by insurance.   GI history:  EGD November 05, 2016 showed esophageal candidiasis, hiatal hernia otherwise normal exam Colonoscopy November 05, 2016: Removal of sessile polyp from ascending colon, pancolonic diverticulosis and hemorrhoids. EGD April 16, 2018 by Dr. Henrene Pastor for melena showed mild esophagitis, normal stomach and multiple superficial ulcers in first part of duodenum   [facility]. colonoscopy on 12/23/2008 there was a small amount of feculent material in the proximal ascending colon. There is moderate pandiverticulosis no endoscopic evidence of diverticulitis. In the mid sigmoid there were 2 small likely hyperplastic polyps in close proximity these were removed with cold biopsy forceps she was advised to have surveillance in 5 years. Patient had an EGD 11/27/2008 in [place] [facility]. She was noted to have short segment Barrett's esophagus and nodular fundal gastritis. Pathology showed squamous mucosa with mild reactive  epithelial changes of the type that may be seen with gastroesophageal reflux. Mild active chronic inflammation of cardiac-type gastric mucosa. No intestinal metaplasia is seen. Negative for dysplasia. She was advised to have surveillance in 2 years.   Outpatient Encounter Medications as of 01/08/2019  Medication Sig  . acetaminophen (TYLENOL) 325 MG tablet Take 1-2 tablets (325-650 mg total) by mouth every 6 (six) hours as needed for mild pain (pain score 1-3 or temp > 100.5).  Marland Kitchen albuterol (PROVENTIL HFA) 108 (90 Base) MCG/ACT inhaler Inhale 1-2 puffs into the lungs every 6 (six) hours as needed for wheezing or shortness of breath.  Marland Kitchen amiodarone (PACERONE) 200 MG tablet Take 1 tablet (200 mg total) by mouth daily.  Marland Kitchen atorvastatin (LIPITOR) 20 MG tablet Take 1 tablet (20 mg total) by mouth daily.  Marland Kitchen buPROPion (WELLBUTRIN SR) 150 MG 12 hr tablet Take 1 tablet (150 mg total) by mouth 2 (two) times daily.  Marland Kitchen diltiazem (CARDIZEM) 120 MG tablet Take 120 mg by mouth daily.  Marland Kitchen ELIQUIS 5 MG TABS tablet Take 1 tablet by mouth twice daily  . famotidine (PEPCID) 20 MG tablet Take 1 tablet (20 mg total) by mouth 2 (two) times daily.  Marland Kitchen FLUoxetine (PROZAC) 20 MG capsule TAKE 1 CAPSULE AT BEDTIME  . furosemide (LASIX) 20 MG tablet Take 1 tablet by mouth twice a week (Patient taking differently: Take 1 tablet by mouth twice a week as needed)  . hydrALAZINE (APRESOLINE) 50 MG tablet Take 1 tablet (50 mg total) by mouth 3 (three) times daily.  . insulin degludec (TRESIBA FLEXTOUCH) 100 UNIT/ML SOPN FlexTouch Pen Inject 0.16 mLs (16 Units total) into the skin  daily. (Patient taking differently: Inject 20 Units into the skin daily. )  . magnesium oxide (MAG-OX) 400 MG tablet Take 400 mg by mouth daily.   . Melatonin 10 MG TABS Take 1 tablet by mouth at bedtime.  . metFORMIN (GLUCOPHAGE) 1000 MG tablet Take 1 tablet (1,000 mg total) by mouth daily with breakfast.  . methimazole (TAPAZOLE) 10 MG tablet TAKE 1 TABLET  (10 MG TOTAL) BY MOUTH DAILY.  . metoprolol succinate (TOPROL-XL) 25 MG 24 hr tablet Take 25 mg by mouth 2 (two) times daily.  . mupirocin ointment (BACTROBAN) 2 % Apply 1 application topically 3 (three) times daily.  . nitroGLYCERIN (NITROSTAT) 0.4 MG SL tablet Place 0.4 mg under the tongue every 5 (five) minutes as needed.   . polyethylene glycol powder (GLYCOLAX/MIRALAX) 17 GM/SCOOP powder Take by mouth as needed.   . potassium chloride (K-DUR) 10 MEQ tablet Take 1 tablet by mouth twice a week (Patient taking differently: Take 1 tablet by mouth twice a week as needed)  . Respiratory Therapy Supplies DEVI BI-PAP @@ Bedtime  . triamcinolone (KENALOG) 0.1 % paste APPLY TO APHTHEOUS ULCER LESION TWICE DAILY  . TRUE METRIX BLOOD GLUCOSE TEST test strip TEST BLOOD SUGAR TWICE DAILY  . sucralfate (CARAFATE) 1 g tablet Take 1 tablet (1 g total) by mouth 4 (four) times daily -  with meals and at bedtime.  . [DISCONTINUED] buPROPion (WELLBUTRIN) 75 MG tablet TAKE 1 TABLET EVERY 6 HOURS.  . [DISCONTINUED] diltiazem (CARDIZEM CD) 120 MG 24 hr capsule Take 1 capsule (120 mg total) by mouth daily.  . [DISCONTINUED] traMADol (ULTRAM) 50 MG tablet Take 50 mg by mouth every 6 (six) hours as needed.   No facility-administered encounter medications on file as of 01/08/2019.     Allergies as of 01/08/2019 - Review Complete 01/08/2019  Allergen Reaction Noted  . Contrast media [iodinated diagnostic agents] Hives and Other (See Comments) 08/07/2017  . Novolog [insulin aspart] Shortness Of Breath and Other (See Comments) 02/27/2015  . Codeine Nausea And Vomiting and Other (See Comments) 09/28/2013  . Iodine Other (See Comments) 01/30/2013  . Penicillins Itching, Rash, and Other (See Comments) 01/30/2013  . Ace inhibitors Cough 03/13/2015  . Demerol [meperidine] Nausea And Vomiting 08/15/2015  . Dilaudid [hydromorphone hcl] Other (See Comments) 05/19/2016  . Neosporin [neomycin-bacitracin zn-polymyx] Itching,  Rash, and Other (See Comments) 01/30/2013  . Percocet [oxycodone-acetaminophen] Rash 08/15/2015  . Tape Itching and Rash 01/30/2013    Past Medical History:  Diagnosis Date  . Abnormal EKG 07/31/2013  . Arthritis    "hands" (03/06/2015)  . Asthma   . Carpal tunnel syndrome, bilateral   . Chronic kidney disease   . Complication of anesthesia    slow to wake up. Pt sts she woke up during surgery many years ago.  . Coronary artery disease     2 v CAD with CTO of the RCA and high grade bifurcational LCx/OM stenosis. S/P PCI DES x 2 to the LCx/OM.  Marland Kitchen Diabetic peripheral neuropathy ([place]) "since 1996"  . GERD (gastroesophageal reflux disease)   . Goiter   . Headache    migraines prior to menopause  . History of shingles 06/01/2013  . Hyperlipidemia LDL goal <70 10/13/2015  . Hypertension   . Hyperthyroidism   . Osteomyelitis of foot (HCC)    Right  . PAF (paroxysmal atrial fibrillation) ([place]) 04/29/2015   CHADS2VASC score of 5 now on Apixaban  . Pneumonia ~ 1976  . Sleep apnea  Bipap  . Tremors of nervous system   . Type II diabetes mellitus (HCC)    insulin dependent    Past Surgical History:  Procedure Laterality Date  . ABDOMINAL HYSTERECTOMY  1988   age 85; CERVICAL DYSPLASIA; ovaries intact.   . AMPUTATION Right 01/23/2016   Procedure: Right 3rd Ray Amputation;  Surgeon: [name], MD;  Location: [place];  Service: Orthopedics;  Laterality: Right;  . AMPUTATION Right 02/13/2016   Procedure: Right Transmetatarsal Amputation;  Surgeon: [name], MD;  Location: [place];  Service: Orthopedics;  Laterality: Right;  . AMPUTATION Right 04/15/2018   Procedure: AMPUTATION BELOW KNEE;  Surgeon: [name], MD;  Location: [place];  Service: Orthopedics;  Laterality: Right;  . BIOPSY  04/16/2018   Procedure: BIOPSY;  Surgeon: Irene Shipper, MD;  Location: [facility] ENDOSCOPY;  Service: Endoscopy;;  . CARDIAC CATHETERIZATION N/A 02/27/2015   Procedure: Left Heart Cath and Coronary  Angiography;  Surgeon: [name], MD; LAD 40%, mCFX 80%, OM 70%, RCA 100% calcified       . CARDIAC CATHETERIZATION N/A 03/06/2015   Procedure: Coronary Stent Intervention;  Surgeon: [name], MD;  Location: [place] [facility];  Service: Cardiovascular;  Laterality: N/A;  Mid CX 3.50x12 promus DES w/ 0% resdual and Prox OM1 2.50x20 promus DES w/ 20% residual  . CARDIOVERSION    . CARDIOVERSION N/A 05/22/2018   Procedure: CARDIOVERSION;  Surgeon: Skeet Latch, MD;  Location: [place];  Service: Cardiovascular;  Laterality: N/A;  . CARPAL TUNNEL RELEASE Right Nov 2015  . CARPAL TUNNEL RELEASE Right 1992; 05/2014   [name]; [place]  . CESAREAN SECTION  1982; 1984  . ESOPHAGOGASTRODUODENOSCOPY (EGD) WITH PROPOFOL N/A 04/16/2018   Procedure: ESOPHAGOGASTRODUODENOSCOPY (EGD) WITH PROPOFOL;  Surgeon: Irene Shipper, MD;  Location: [facility] ENDOSCOPY;  Service: Endoscopy;  Laterality: N/A;  . FOOT NEUROMA SURGERY Bilateral 2000  . I&D EXTREMITY Left 01/06/2018   Procedure: DEBRIDEMENT ULCER LEFT FOOT;  Surgeon: [name], MD;  Location: [place];  Service: Orthopedics;  Laterality: Left;  . KNEE ARTHROSCOPY Right ~ 2003   "meniscus repair"  . SHOULDER OPEN ROTATOR CUFF REPAIR Right 1996; 1998   "w/fracture repair"  . THYROID SURGERY  2000   "removed lots of nodules"  . TONSILLECTOMY  1976    Family History  Problem Relation Age of Onset  . Cancer Mother 3       bronchial cancer  . Breast cancer Mother   . Lung cancer Mother   . Hypertension Father   . COPD Father   . Heart disease Father 72       CAD with cardiac stenting  . Heart attack Father   . Parkinson's disease Father   . Allergies Sister   . Breast cancer Maternal Grandmother   . Emphysema Maternal Grandfather   . Leukemia Paternal Grandmother   . Emphysema Paternal Grandfather   . Thyroid disease Neg Hx     Social History   Socioeconomic History  . Marital status: Single    Spouse name: Not on file  .  Number of children: 2  . Years of education: Masters  . Highest education level: Not on file  Occupational History  . Occupation: Landscape architect  Social Needs  . Financial resource strain: Somewhat hard  . Food insecurity    Worry: Sometimes true    Inability: Sometimes true  . Transportation needs    Medical: No    Non-medical: No  Tobacco Use  . Smoking status: Former Smoker    Packs/day: 0.00    Years: 41.00    Pack years: 0.00    Types: Cigarettes    Quit date: 03/05/2015    Years since quitting: 3.8  . Smokeless tobacco: Never Used  . Tobacco comment: 04/29/2015 "quit smoking cigarettes 02/27/2015"  Substance and Sexual Activity  . Alcohol use: No  . Drug use: No  . Sexual activity: Not Currently    Birth control/protection: Post-menopausal, Surgical  Lifestyle  . Physical activity    Days per week: 0 days    Minutes per session: 0 min  . Stress: Very much  Relationships  . Social connections    Talks on phone: More than three times a week    Gets together: More than three times a week    Attends religious service: More than 4 times per year    Active member of club or organization: No    Attends meetings of clubs or organizations: Never    Relationship status: Never married  . Intimate partner violence    Fear of current or ex partner: No    Emotionally abused: No    Physically abused: No    Forced sexual activity: No  Other Topics Concern  . 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      Lives with sister in [place].      Tobacco: 1 ppd x 41 years - quit 2016      Alcohol: none      Drugs: none      Exercise:  Walking in neighborhood; physical job.   Right-handed.   2 cups caffeine daily.      Review of systems:  Review of Systems  Constitutional: Negative for fever and chills.  HENT: Negative.   Eyes: Negative for blurred vision.  Respiratory: Negative for cough, shortness of breath and wheezing.   Cardiovascular: Negative for chest pain and palpitations.  Gastrointestinal: as per HPI Genitourinary: Negative for dysuria, urgency, frequency and hematuria.  Musculoskeletal: Positive for myalgias, back pain and joint pain.  Skin: Negative for itching and rash.  Neurological: Negative for dizziness, tremors, focal weakness, seizures and loss of consciousness.  Endo/Heme/Allergies: Negative  psychiatric/Behavioral: Negative for depression, suicidal ideas and hallucinations.  All other systems reviewed and are negative.   Physical Exam: Vitals:   01/08/19 1318  Temp: (!) 96.4 F (35.8 C)   Body mass index is 29.63 kg/m. Gen:      No acute distress HEENT:  EOMI, sclera anicteric Neck:     No masses; no thyromegaly Lungs:    Clear to auscultation bilaterally; normal respiratory effort CV:         Regular rate and rhythm; no murmurs Abd:      + bowel sounds; soft, non-tender; no palpable masses, no distension Ext:    No edema; adequate peripheral perfusion Skin:      Warm and dry; no rash Neuro: alert and oriented x 3 Psych: normal mood and affect  Data Reviewed:  Reviewed labs, radiology imaging, old records and pertinent past GI work up   Assessment and Plan/Recommendations:  68 year old female with history of hypertension, diabetes, CAD, A. fib on Eliquis chronic GERD with exacerbation of symptoms  History of esophagitis and duodenal ulcers on EGD December 2019 She is off PPI due to hypomagnesemia, most recent magnesium level within normal limits Continue Pepcid twice daily Carafate 1 g before meals and at bedtime as needed We will schedule for EGD to evaluate her recent exacerbation of GERD symptoms and exclude esophagitis The risks and benefits as well as alternatives of  endoscopic procedure(s) have been discussed and reviewed. All questions answered. The patient agrees to proceed.   Iron deficiency anemia secondary to slow occult GI blood loss Capsule endoscopy was not approved by insurance Recheck CBC Up-to-date with colorectal cancer screening  We will request cardiology clearance to hold Eliquis 2 days prior to procedure   K. Denzil Magnuson , MD    CC: Rutherford Guys, MD

## 2019-01-08 NOTE — Patient Instructions (Signed)
You will be contacted by our office prior to your procedure for directions on holding your ELIQUIS.  If you do not hear from our office 1 week prior to your scheduled procedure, please call [phone] to discuss.   You have been scheduled for an endoscopy. Please follow written instructions given to you at your visit today. If you use inhalers (even only as needed), please bring them with you on the day of your procedure.   Go to the basement for labs today  We will send carafate to your pharmacy today  I appreciate the  opportunity to care for you  Thank You   Harl Bowie , MD

## 2019-01-08 NOTE — Telephone Encounter (Signed)
   Primary Cardiologist: Fransico Him, MD  Chart reviewed as part of pre-operative protocol coverage. Pt was seen in clinic by cardiology PA today and cleared for endoscopy. Given past medical history and time since last visit, based on ACC/AHA guidelines, Harshika C [facility]craft would be at acceptable risk for the planned procedure without further cardiovascular testing.   Per office protocol, patient can hold Eliquis for 1-2 days prior to procedure.     I will route this recommendation to the requesting party via Epic fax function and remove from pre-op pool.  Please call with questions.  Lyda Jester, PA-C 01/08/2019, 3:45 PM

## 2019-01-08 NOTE — Patient Instructions (Signed)
Medication Instructions:  Your physician recommends that you continue on your current medications as directed. Please refer to the Current Medication list given to you today.  If you need a refill on your cardiac medications before your next appointment, please call your pharmacy.   Lab work: None ordered  If you have labs (blood work) drawn today and your tests are completely normal, you will receive your results only by: Marland Kitchen MyChart Message (if you have MyChart) OR . A paper copy in the mail If you have any lab test that is abnormal or we need to change your treatment, we will call you to review the results.  Testing/Procedures: None ordered  Follow-Up: At [facility], you and your health needs are our priority.  As part of our continuing mission to provide you with exceptional heart care, we have created designated Provider Care Teams.  These Care Teams include your primary Cardiologist (physician) and Advanced Practice Providers (APPs -  Physician Assistants and Nurse Practitioners) who all work together to provide you with the care you need, when you need it. You will need a follow up appointment in AS SCHEDULED  Any Other Special Instructions Will Be Listed Below (If Applicable).

## 2019-01-08 NOTE — Progress Notes (Signed)
Cardiology Office Note    Date:  01/08/2019   ID:  Judith [name], Judith [name] 11, 1952, MRN [id number]  PCP:  Rutherford Guys, MD  Cardiologist: Dr. Radford Pax   Chief Complaint:  SOB  History of Present Illness:   Judith [name] is a 68 y.o. 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   Long hx of recurrent trial fibrillation. Failed sotolol. Currently on Amiodarone. Hospitalized in February at [facility] for DKA and incidental L thalamic ICH. Maintained on Eliquis. Low risk stress test 10/2018.  added to my schedule for evaluation of shortness of breath with laying down.  She reports orthopnea and PND last week which resolved with as needed Lasix 20 mg.  She took diuretic for 5 days.  Symptoms completely resolved for past 3 days and has not took Lasix.  After long discussion it was find out that she was eating high sodium diet.  She denies chest pain, shortness of breath, palpitation, dizziness, lower extremity edema or melena.  Past Medical History:  Diagnosis Date  . Abnormal EKG 07/31/2013  . Arthritis    "hands" (03/06/2015)  . Asthma   . Carpal tunnel syndrome, bilateral   . Chronic kidney disease   . Complication of anesthesia    slow to wake up. Pt sts she woke up during surgery many years ago.  . Coronary artery disease     2 v CAD with CTO of the RCA and high grade bifurcational LCx/OM stenosis. S/P PCI DES x 2 to the LCx/OM.  Marland Kitchen Diabetic peripheral neuropathy ([place]) "since 1996"  . GERD (gastroesophageal reflux disease)   . Goiter   . Headache    migraines prior to menopause  . History of shingles 06/01/2013  . Hyperlipidemia LDL goal <70 10/13/2015  . Hypertension   . Hyperthyroidism   . Osteomyelitis of foot (HCC)    Right  . PAF  (paroxysmal atrial fibrillation) ([place]) 04/29/2015   CHADS2VASC score of 5 now on Apixaban  . Pneumonia ~ 1976  . Sleep apnea    Bipap  . Tremors of nervous system   . Type II diabetes mellitus (HCC)    insulin dependent    Past Surgical History:  Procedure Laterality Date  . ABDOMINAL HYSTERECTOMY  1988   age 68; CERVICAL DYSPLASIA; ovaries intact.   . AMPUTATION Right 01/23/2016   Procedure: Right 3rd Ray Amputation;  Surgeon: [name], MD;  Location: [place];  Service: Orthopedics;  Laterality: Right;  . AMPUTATION Right 02/13/2016   Procedure: Right Transmetatarsal Amputation;  Surgeon: [name], MD;  Location: [place];  Service: Orthopedics;  Laterality: Right;  . AMPUTATION Right 04/15/2018   Procedure: AMPUTATION BELOW KNEE;  Surgeon: [name], MD;  Location: [place];  Service: Orthopedics;  Laterality: Right;  . BIOPSY  04/16/2018   Procedure: BIOPSY;  Surgeon: Irene Shipper, MD;  Location: [facility] ENDOSCOPY;  Service: Endoscopy;;  . CARDIAC CATHETERIZATION N/A 02/27/2015   Procedure: Left Heart Cath and Coronary Angiography;  Surgeon: [name], MD; LAD 40%, mCFX 80%, OM 70%, RCA 100% calcified       . CARDIAC CATHETERIZATION N/A 03/06/2015   Procedure: Coronary Stent Intervention;  Surgeon: [name], MD;  Location: [place] CV LAB;  Service: Cardiovascular;  Laterality: N/A;  Mid CX 3.50x12 promus DES w/ 0% resdual and Prox OM1 2.50x20 promus DES w/ 20% residual  . CARDIOVERSION    . CARDIOVERSION N/A 05/22/2018   Procedure: CARDIOVERSION;  Surgeon: Skeet Latch, MD;  Location: [place];  Service: Cardiovascular;  Laterality: N/A;  . CARPAL TUNNEL RELEASE Right Nov 2015  . CARPAL TUNNEL RELEASE Right 1992; 05/2014   [name]; [place]  . CESAREAN SECTION  1982; 1984  . ESOPHAGOGASTRODUODENOSCOPY (EGD) WITH PROPOFOL N/A 04/16/2018   Procedure: ESOPHAGOGASTRODUODENOSCOPY (EGD) WITH PROPOFOL;  Surgeon: Irene Shipper, MD;  Location: [facility] ENDOSCOPY;  Service:  Endoscopy;  Laterality: N/A;  . FOOT NEUROMA SURGERY Bilateral 2000  . I&D EXTREMITY Left 01/06/2018   Procedure: DEBRIDEMENT ULCER LEFT FOOT;  Surgeon: [name], MD;  Location: [place];  Service: Orthopedics;  Laterality: Left;  . KNEE ARTHROSCOPY Right ~ 2003   "meniscus repair"  . LIPOMA EXCISION Right 2020   thigh  . SHOULDER OPEN ROTATOR CUFF REPAIR Right 1996; 1998   "w/fracture repair"  . THYROID SURGERY  2000   "removed lots of nodules"  . TONSILLECTOMY  1976    Current Medications: Prior to Admission medications   Medication Sig Start Date End Date Taking? Authorizing Provider  acetaminophen (TYLENOL) 325 MG tablet Take 1-2 tablets (325-650 mg total) by mouth every 6 (six) hours as needed for mild pain (pain score 1-3 or temp > 100.5). 05/01/18   Angiulli, Lavon Paganini, PA-C  albuterol (PROVENTIL HFA) 108 (90 Base) MCG/ACT inhaler Inhale 1-2 puffs into the lungs every 6 (six) hours as needed for wheezing or shortness of breath. 05/01/18   Angiulli, Lavon Paganini, PA-C  amiodarone (PACERONE) 200 MG tablet Take 1 tablet (200 mg total) by mouth daily. 06/08/18   Sherran Needs, NP  atorvastatin (LIPITOR) 20 MG tablet Take 1 tablet (20 mg total) by mouth daily. 05/01/18   Angiulli, Lavon Paganini, PA-C  buPROPion (WELLBUTRIN SR) 150 MG 12 hr tablet Take 1 tablet (150 mg total) by mouth 2 (two) times daily. 05/01/18   Angiulli, Lavon Paganini, PA-C  buPROPion (WELLBUTRIN) 75 MG tablet TAKE 1 TABLET EVERY 6 HOURS. 01/03/19   Rutherford Guys, MD  diltiazem (CARDIZEM CD) 120 MG 24 hr capsule Take 1 capsule (120 mg total) by mouth daily. 07/07/18   Rutherford Guys, MD  ELIQUIS 5 MG TABS tablet Take 1 tablet by mouth twice daily 11/21/18   Sueanne Margarita, MD  famotidine (PEPCID) 20 MG tablet Take 1 tablet (20 mg total) by mouth 2 (two) times daily. 06/17/18   Rutherford Guys, MD  FLUoxetine (PROZAC) 20 MG capsule TAKE 1 CAPSULE AT BEDTIME 01/03/19   Rutherford Guys, MD  furosemide (LASIX) 20 MG tablet Take  1 tablet by mouth twice a week 11/14/18   Isaiah Serge, NP  hydrALAZINE (APRESOLINE) 50 MG tablet Take 1 tablet (50 mg total) by mouth 3 (three) times daily. 10/13/18   Isaiah Serge, NP  insulin degludec (TRESIBA FLEXTOUCH) 100 UNIT/ML SOPN FlexTouch Pen Inject 0.16 mLs (16 Units total) into the skin daily. 10/20/18   Renato Shin, MD  magnesium oxide (MAG-OX) 400 MG tablet Take 400 mg by mouth 2 (two) times daily.    [provider]  Melatonin 10 MG TABS Take 1 tablet by mouth at bedtime.    [provider]  metFORMIN (GLUCOPHAGE) 1000 MG tablet Take 1 tablet (1,000 mg total) by mouth daily with breakfast. 06/07/18   Loanne Drilling,  Hilliard Clark, MD  methimazole (TAPAZOLE) 10 MG tablet TAKE 1 TABLET (10 MG TOTAL) BY MOUTH DAILY. 12/10/18   Renato Shin, MD  metoprolol succinate (TOPROL-XL) 25 MG 24 hr tablet Take 25 mg by mouth 2 (two) times daily.    [provider]  mupirocin ointment (BACTROBAN) 2 % Apply 1 application topically 3 (three) times daily. 06/16/18   Rutherford Guys, MD  nitroGLYCERIN (NITROSTAT) 0.4 MG SL tablet Place 0.4 mg under the tongue every 5 (five) minutes as needed.     [provider]  polyethylene glycol powder (GLYCOLAX/MIRALAX) 17 GM/SCOOP powder Take by mouth as needed.     [provider]  potassium chloride (K-DUR) 10 MEQ tablet Take 1 tablet by mouth twice a week 11/14/18   Isaiah Serge, NP  Respiratory Therapy Supplies DEVI BI-PAP @@ Bedtime    [provider]  traMADol (ULTRAM) 50 MG tablet Take 50 mg by mouth every 6 (six) hours as needed.    [provider]  triamcinolone (KENALOG) 0.1 % paste APPLY TO APHTHEOUS ULCER LESION TWICE DAILY 10/18/18   [provider]  TRUE METRIX BLOOD GLUCOSE TEST test strip TEST BLOOD SUGAR TWICE DAILY 09/16/18   Renato Shin, MD    Allergies:   Contrast media [iodinated diagnostic agents], Novolog [insulin aspart], Codeine, Iodine, Penicillins, Ace inhibitors, Demerol  [meperidine], Dilaudid [hydromorphone hcl], Neosporin [neomycin-bacitracin zn-polymyx], Percocet [oxycodone-acetaminophen], and Tape   Social History   Socioeconomic History  . Marital status: Single    Spouse name: Not on file  . Number of children: 2  . Years of education: Masters  . Highest education level: Not on file  Occupational History  . Occupation: Landscape architect  Social Needs  . Financial resource strain: Somewhat hard  . Food insecurity    Worry: Sometimes true    Inability: Sometimes true  . Transportation needs    Medical: No    Non-medical: No  Tobacco Use  . Smoking status: Former Smoker    Packs/day: 0.00    Years: 41.00    Pack years: 0.00    Types: Cigarettes    Quit date: 03/05/2015    Years since quitting: 3.8  . Smokeless tobacco: Never Used  . Tobacco comment: 04/29/2015 "quit smoking cigarettes 02/27/2015"  Substance and Sexual Activity  . Alcohol use: No  . Drug use: No  . Sexual activity: Not Currently    Birth control/protection: Post-menopausal, Surgical  Lifestyle  . Physical activity    Days per week: 0 days    Minutes per session: 0 min  . Stress: Very much  Relationships  . Social connections    Talks on phone: More than three times a week    Gets together: More than three times a week    Attends religious service: More than 4 times per year    Active member of club or organization: No    Attends meetings of clubs or organizations: Never    Relationship status: Never married  Other Topics Concern  . 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      Lives with sister in [place].      Tobacco: 1 ppd x 41 years - quit 2016      Alcohol:  none      Drugs: none      Exercise:  Walking in neighborhood;  physical job.   Right-handed.   2 cups caffeine daily.     Family History:  The patient's family history includes Allergies in her sister; Breast cancer in her maternal grandmother and mother; COPD in her father; Cancer (age of onset: 96) in her mother; Emphysema in her maternal grandfather and paternal grandfather; Heart attack in her father; Heart disease (age of onset: 41) in her father; Hypertension in her father; Leukemia in her paternal grandmother; Lung cancer in her mother; Parkinson's disease in her father.   ROS:   Please see the history of present illness.    ROS All other systems reviewed and are negative.   PHYSICAL EXAM:   VS:  BP 122/68   Pulse (!) 116   Ht 5\' 4"  (1.626 m)   Wt 173 lb 6.4 oz (78.7 kg)   SpO2 98%   BMI 29.76 kg/m    GEN: Well nourished, well developed, in no acute distress  HEENT: normal  Neck: no JVD, carotid bruits, or masses Cardiac: RRR; no murmurs, rubs, or gallops,no edema  Respiratory:  clear to auscultation bilaterally, normal work of breathing GI: soft, nontender, nondistended, + BS MS: Status post right BKA on prosthesis Skin: warm and dry, no rash Neuro:  Alert and Oriented x 3, Strength and sensation are intact Psych: euthymic mood, full affect  Wt Readings from Last 3 Encounters:  01/08/19 173 lb 6.4 oz (78.7 kg)  01/08/19 172 lb 9.6 oz (78.3 kg)  11/17/18 163 lb (73.9 kg)      Studies/Labs Reviewed:   EKG:  EKG is not  ordered today.   Recent Labs: 07/07/2018: ALT 28 10/13/2018: NT-Pro BNP 2,908 10/18/2018: TSH 0.474 11/06/2018: BUN 27; Creatinine, Ser 1.17; Magnesium 2.0; Potassium 4.9; Sodium 135 01/08/2019: Hemoglobin 10.1 Repeated and verified X2.; Platelets 366.0   Lipid Panel    Component Value Date/Time   CHOL 118 10/18/2018 1135   TRIG 152 (H) 10/18/2018 1135   HDL 38 (L) 10/18/2018 1135   CHOLHDL 3.1 10/18/2018 1135   CHOLHDL 5.8 (H) 02/05/2016 1047   VLDL 76 (H) 02/05/2016 1047   LDLCALC 50 10/18/2018 1135    LDLDIRECT 97 01/06/2017 0904    Additional studies/ records that were reviewed today include:   Echo 05/16/18 Study Conclusions  - Left ventricle: The cavity size was normal. Wall thickness was increased in a pattern of severe LVH. Systolic function was normal. The estimated ejection fraction was in the range of 60% to 65%. Wall motion was normal; there were no regional wall motion abnormalities. There was an increased relative contribution of atrial contraction to ventricular filling. Doppler parameters are consistent with abnormal left ventricular relaxation (grade 1 diastolic dysfunction). Doppler parameters are consistent with high ventricular filling pressure. - Aortic valve: Valve mobility was restricted. There was mild to moderate stenosis. Mean gradient (S): 17 mm Hg. VTI ratio of LVOT to aortic valve: 0.36. Valve area (VTI): 1.37 cm^2. Valve area (Vmax): 1.22 cm^2. Valve area (Vmean): 1.27 cm^2. - Mitral valve: Calcified annulus. Valve area by pressure half-time: 2.39 cm^2. - Left atrium: The atrium was moderately dilated. - Pulmonary arteries: Systolic pressure could not be accurately estimated.  Echo 06/2018 through [facility] Interpretation Summary A complete two-dimensional transthoracic echocardiogram with color flow Doppler and spectral Doppler was performed. There is no comparison study available.  The left ventricle is normal in size. Proximal septal thickening is  noted. The left ventricular ejection fraction is normal (55-60%). The left ventricular wall motion is normal. Grade I mild diastolic dysfunction; abnormal relaxation pattern. The right ventricle is grossly normal in size and function. The left and right atria are normal size. The mitral valve leaflets appear calcified, but not stenotic. Estimation of right ventricular systolic pressure is not possible. There is heavy calcification of the aortic valve. By gradients, patient  falls into category of mild aortic stenosis. Visually and by VTI, likely more in the moderate range. The IVC is normal in size.  Left Ventricle The left ventricle is normal in size. Proximal septal thickening is noted. The left ventricular ejection fraction is normal (55-60%). The left ventricular wall motion is normal. Grade I mild diastolic dysfunction; abnormal relaxation pattern.   Right Ventricle The right ventricle is grossly normal in size and function.  Atria The left and right atria are normal size. The IVC is normal in size.  Mitral Valve There is severe mitral annular calcification. The mitral valve leaflets appear thickened, but open well. The mitral valve leaflets appear calcified, but not stenotic. There is trace mitral regurgitation.   Tricuspid Valve The tricuspid valve leaflets are thin and pliable and the valve motion is normal. There is trace tricuspid regurgitation. Estimation of right ventricular systolic pressure is not possible.  Aortic Valve There is heavy calcification of the aortic valve. By gradients, patient falls into category of mild aortic stenosis. Visually and by VTI, likely more in the moderate range. There is trace aortic regurgitation present.  Pulmonic Valve The pulmonic valve is not well visualized. There is no pulmonic regurgitation.  Vessels The aortic root is normal in diameter. The pulmonary artery is not well Visualized.  Nuc study 10/19/18 Study Highlights    Nuclear stress EF: 57%.  There was no ST segment deviation noted during stress.  This is a low risk study.  The left ventricular ejection fraction is normal (55-65%).  Low risk stress test with no evidence of ischemia or infarction    03/06/15: LHC/PCI Left Anterior Descending  Prox LAD to Mid LAD lesion 40% stenosed  calcified diffuse.  Left Circumflex  Mid Cx lesion 80% stenosed  calcified.  Second Obtuse Marginal Branch  Ost 2nd Mrg to 2nd Mrg  lesion 70% stenosed  calcified diffuse.  Right Coronary Artery  Collaterals  Dist RCA filled by collaterals from Dist LAD.    Prox RCA lesion 100% stenosed  calcified diffuse.  Intervention      ASSESSMENT & PLAN:    1. Orthopnea and PND Resolved with as needed Lasix.  Discussed low-sodium diet in detail.  Continue PRN Lasix.  2. CAD -No angina.  No change in therapy.  3. Mild to moderate AS -  Mean gradient (S): 17 mm Hg by echo in 05/2018.  4.  Persistent atrial fibrillation -Continue amiodarone and anticoagulation with Eliquis.  No bleeding issue.  5.  Surgical clearance for Endoscopy -Recent stress test low risk.  No chest pain or shortness of breath.  She is clear without further cardiac work-up. -She is thinking about repeat rotator cuff surgery>> she will be clear.  Will need phone call.  Medication Adjustments/Labs and Tests Ordered: Current medicines are reviewed at length with the patient today.  Concerns regarding medicines are outlined above.  Medication changes, Labs and Tests ordered today are listed in the Patient Instructions below. Patient Instructions  Medication Instructions:  Your physician recommends that you continue on your current medications as directed. Please  refer to the Current Medication list given to you today.  If you need a refill on your cardiac medications before your next appointment, please call your pharmacy.   Lab work: None ordered  If you have labs (blood work) drawn today and your tests are completely normal, you will receive your results only by: Marland Kitchen MyChart Message (if you have MyChart) OR . A paper copy in the mail If you have any lab test that is abnormal or we need to change your treatment, we will call you to review the results.  Testing/Procedures: None ordered  Follow-Up: At [facility], you and your health needs are our priority.  As part of our continuing mission to provide you with exceptional heart care, we  have created designated Provider Care Teams.  These Care Teams include your primary Cardiologist (physician) and Advanced Practice Providers (APPs -  Physician Assistants and Nurse Practitioners) who all work together to provide you with the care you need, when you need it. You will need a follow up appointment in AS SCHEDULED  Any Other Special Instructions Will Be Listed Below (If Applicable).       Jarrett Soho, [place]  01/08/2019 3:28 PM    [facility] [address], [place], [place]  [address] Phone: [phone]; Fax: [phone]

## 2019-01-08 NOTE — Telephone Encounter (Signed)
[facility] Pre-operative Risk Assessment     Request for surgical clearance:     Endoscopy Procedure  What type of surgery is being performed?     Endoscopy  When is this surgery scheduled?     01/19/2019  What type of clearance is required ?   Pharmacy  Are there any medications that need to be held prior to surgery and how long? Eliquis  Practice name and name of physician performing surgery?      [facility]  What is your office phone and fax number?      Phone- [phone]  Fax267 [phone]  Anesthesia type (None, local, MAC, general) ?       MAC

## 2019-01-08 NOTE — Telephone Encounter (Signed)
Patient with diagnosis of afib on Eliquis for anticoagulation.    Procedure: Endoscopy  Date of procedure: 01/19/2019  CHADS2-VASc score of  5 (HTN, AGE, DM2, CAD, female)  CrCl 47 ml/min  Per office protocol, patient can hold Eliquis for 1-2 days prior to procedure.

## 2019-01-09 ENCOUNTER — Ambulatory Visit (INDEPENDENT_AMBULATORY_CARE_PROVIDER_SITE_OTHER): Payer: Medicare PPO | Admitting: Family

## 2019-01-09 ENCOUNTER — Encounter: Payer: Self-pay | Admitting: Family

## 2019-01-09 VITALS — Ht 64.0 in | Wt 173.0 lb

## 2019-01-09 DIAGNOSIS — R2241 Localized swelling, mass and lump, right lower limb: Secondary | ICD-10-CM

## 2019-01-09 DIAGNOSIS — M7541 Impingement syndrome of right shoulder: Secondary | ICD-10-CM

## 2019-01-09 NOTE — Progress Notes (Signed)
Office Visit Note   Patient: Judith [name]           Date of Birth: 1950-08-13           MRN: [id number] Visit Date: 01/09/2019              Requested by: Rutherford Guys, MD [address] [place],  [place] [address] PCP: Rutherford Guys, MD  Chief Complaint  Patient presents with  . Right Shoulder - Pain      HPI: The patient is a 68 yo woman who is seen for her right shoulder. She has had chronic impingement symptoms and was scheduling for arthroscopy and debridement of the right shoulder but surgery was put on hold due to Covid 19. She would like to proceed with shoulder arthroscopy at this time.  She has also developed a mass over the right lateral thigh which has become tender to palpation and she would like this excised as well.   She is otherwise doing very well following a right transtibial amputation and is ambulating with a cane and right prosthesis.   Has received cardiac clearance and would like to schedule her surgery.  Assessment & Plan: Visit Diagnoses:  No diagnosis found.  Plan: Plan right shoulder arthroscopy and debridement and right thigh excision of mass.  She will follow up following surgery.   Follow-Up Instructions: Return for surgery next week.   Ortho Exam  Patient is alert, oriented, no adenopathy, well-dressed, normal affect, normal respiratory effort. Right shoulder tender over the deltoid distal and biceps insertion. Painful and limited forward flexion, internal and external rotation. + impingement. Negative drop arm. Neurovascular intact in the right upper extremity.   Right lateral thigh with ~ 2cm firm, slightly mobile tender mass within the subcutaneous tissue without increased warmth, no erythema, no induration or other signs of infection or cellulitis.   Imaging: No results found. No images are attached to the encounter.  Labs: Lab Results  Component Value Date   HGBA1C 6.6 (H) 10/18/2018   HGBA1C 7.5 (A) 06/07/2018   HGBA1C 8.0  (H) 04/14/2018   ESRSEDRATE 133 (H) 01/02/2018   CRP 23.5 (H) 01/02/2018   REPTSTATUS 05/20/2018 FINAL 05/18/2018   GRAMSTAIN  01/06/2018    MODERATE WBC PRESENT, PREDOMINANTLY PMN RARE GRAM POSITIVE COCCI    CULT 20,000 COLONIES/mL SERRATIA MARCESCENS (A) 05/18/2018   LABORGA SERRATIA MARCESCENS (A) 05/18/2018     Lab Results  Component Value Date   ALBUMIN 4.0 07/07/2018   ALBUMIN 3.6 06/08/2018   ALBUMIN 4.2 05/25/2018    Body mass index is 29.7 kg/m.  Orders:  No orders of the defined types were placed in this encounter.  No orders of the defined types were placed in this encounter.    Procedures: No procedures performed  Clinical Data: No additional findings.  ROS:  All other systems negative, except as noted in the HPI. Review of Systems  Constitutional: Negative for chills and fever.  Musculoskeletal: Positive for arthralgias and myalgias.    Objective: Vital Signs: Ht 5\' 4"  (1.626 m)   Wt 173 lb (78.5 kg)   BMI 29.70 kg/m   Specialty Comments:  No specialty comments available.  PMFS History: Patient Active Problem List   Diagnosis Date Noted  . Diarrhea 09/05/2018  . Urinary tract infection 06/30/2018  . ICH (intracerebral hemorrhage) ([place]) 06/27/2018  . DKA (diabetic ketoacidoses) ([place]) 06/26/2018  . V-tach ([place]) 05/14/2018  . Seizure ([place]) 05/14/2018  . Slow transit constipation   .  Fall   . Labile blood glucose   . Labile blood pressure   . Chronic combined systolic and diastolic congestive heart failure ([place])   . Chronic diastolic congestive heart failure ([place])   . CKD (chronic kidney disease), stage III ([place])   . Type 2 diabetes mellitus with peripheral neuropathy (HCC)   . Amputation of right lower extremity below knee ([place]) 04/20/2018  . Unilateral traumatic amputation of right leg below knee with complication, initial encounter ([place])   . Post-operative pain   . PAF (paroxysmal atrial fibrillation) ([place])   . Duodenal ulcer   .  Atrial fibrillation with RVR ([place]) 04/14/2018  . Foot osteomyelitis, right ([place]) 04/14/2018  . Cutaneous abscess of left foot   . Acute respiratory failure with hypoxia ([place])   . Cellulitis 01/02/2018  . LGI bleed   . Acute blood loss anemia   . Wide-complex tachycardia ([place]) 07/04/2017  . Type II diabetes mellitus, uncontrolled ([place]) 07/04/2017  . Peripheral neuropathy 07/04/2017  . H/O hyperthyroidism 07/04/2017  . Atrial fibrillation with rapid ventricular response ([place]) 08/06/2016  . S/P transmetatarsal amputation of foot, right ([place]) 04/19/2016  . Obstructive sleep apnea 11/26/2015  . Bilateral carpal tunnel syndrome 11/26/2015  . Hypomagnesemia 11/16/2015  . Hyperlipidemia LDL goal <70 10/13/2015  . Abnormality of gait 09/02/2015  . Memory loss 08/12/2015  . Diabetic peripheral neuropathy ([place]) 08/12/2015  . Vitamin D deficiency 08/12/2015  . Hyperthyroidism 04/30/2015  . Heme positive stool   . Persistent atrial fibrillation 04/29/2015  . Coronary artery disease with stable angina pectoris ([place]) 03/29/2015  . Abnormal nuclear stress test   . History of goiter 09/28/2014  . GERD (gastroesophageal reflux disease) 07/31/2013  . Depression with anxiety 06/01/2013  . DM (diabetes mellitus), type 2 with peripheral vascular complications ([place]) [id number]  . HTN (hypertension) 01/30/2013   Past Medical History:  Diagnosis Date  . Abnormal EKG 07/31/2013  . Arthritis    "hands" (03/06/2015)  . Asthma   . Carpal tunnel syndrome, bilateral   . Chronic kidney disease   . Complication of anesthesia    slow to wake up. Pt sts she woke up during surgery many years ago.  . Coronary artery disease     2 v CAD with CTO of the RCA and high grade bifurcational LCx/OM stenosis. S/P PCI DES x 2 to the LCx/OM.  Marland Kitchen Diabetic peripheral neuropathy ([place]) "since 1996"  . GERD (gastroesophageal reflux disease)   . Goiter   . Headache    migraines prior to menopause  . History of shingles  06/01/2013  . Hyperlipidemia LDL goal <70 10/13/2015  . Hypertension   . Hyperthyroidism   . Osteomyelitis of foot (HCC)    Right  . PAF (paroxysmal atrial fibrillation) ([place]) 04/29/2015   CHADS2VASC score of 5 now on Apixaban  . Pneumonia ~ 1976  . Sleep apnea    Bipap  . Tremors of nervous system   . Type II diabetes mellitus (HCC)    insulin dependent    Family History  Problem Relation Age of Onset  . Cancer Mother 4       bronchial cancer  . Breast cancer Mother   . Lung cancer Mother   . Hypertension Father   . COPD Father   . Heart disease Father 1       CAD with cardiac stenting  . Heart attack Father   . Parkinson's disease Father   . Allergies Sister   . Breast cancer Maternal Grandmother   .  Emphysema Maternal Grandfather   . Leukemia Paternal Grandmother   . Emphysema Paternal Grandfather   . Thyroid disease Neg Hx   . Colon cancer Neg Hx     Past Surgical History:  Procedure Laterality Date  . ABDOMINAL HYSTERECTOMY  1988   age 28; CERVICAL DYSPLASIA; ovaries intact.   . AMPUTATION Right 01/23/2016   Procedure: Right 3rd Ray Amputation;  Surgeon: [name], MD;  Location: [facility];  Service: Orthopedics;  Laterality: Right;  . AMPUTATION Right 02/13/2016   Procedure: Right Transmetatarsal Amputation;  Surgeon: [name], MD;  Location: [facility];  Service: Orthopedics;  Laterality: Right;  . AMPUTATION Right 04/15/2018   Procedure: AMPUTATION BELOW KNEE;  Surgeon: [name], MD;  Location: [facility];  Service: Orthopedics;  Laterality: Right;  . BIOPSY  04/16/2018   Procedure: BIOPSY;  Surgeon: Irene Shipper, MD;  Location: [facility];  Service: Endoscopy;;  . CARDIAC CATHETERIZATION N/A 02/27/2015   Procedure: Left Heart Cath and Coronary Angiography;  Surgeon: [name], MD; LAD 40%, mCFX 80%, OM 70%, RCA 100% calcified       . CARDIAC CATHETERIZATION N/A 03/06/2015   Procedure: Coronary Stent Intervention;  Surgeon: [name], MD;  Location:  [facility];  Service: Cardiovascular;  Laterality: N/A;  Mid CX 3.50x12 promus DES w/ 0% resdual and Prox OM1 2.50x20 promus DES w/ 20% residual  . CARDIOVERSION    . CARDIOVERSION N/A 05/22/2018   Procedure: CARDIOVERSION;  Surgeon: Skeet Latch, MD;  Location: [facility];  Service: Cardiovascular;  Laterality: N/A;  . CARPAL TUNNEL RELEASE Right Nov 2015  . CARPAL TUNNEL RELEASE Right 1992; 05/2014   [name]; [place]  . CESAREAN SECTION  1982; 1984  . ESOPHAGOGASTRODUODENOSCOPY (EGD) WITH PROPOFOL N/A 04/16/2018   Procedure: ESOPHAGOGASTRODUODENOSCOPY (EGD) WITH PROPOFOL;  Surgeon: Irene Shipper, MD;  [facility];  Service: Endoscopy;  Laterality: N/A;  . FOOT NEUROMA SURGERY Bilateral 2000  . I&D EXTREMITY Left 01/06/2018   Procedure: DEBRIDEMENT ULCER LEFT FOOT;  Surgeon: [name], MD;  Location: [facility];  Service: Orthopedics;  Laterality: Left;  . KNEE ARTHROSCOPY Right ~ 2003   "meniscus repair"  . LIPOMA EXCISION Right 2020   thigh  . SHOULDER OPEN ROTATOR CUFF REPAIR Right 1996; 1998   "w/fracture repair"  . THYROID SURGERY  2000   "removed lots of nodules"  . TONSILLECTOMY  1976   Social History   Occupational History  . Occupation: Landscape architect  Tobacco Use  . Smoking status: Former Smoker    Packs/day: 0.00    Years: 41.00    Pack years: 0.00    Types: Cigarettes    Quit date: 03/05/2015    Years since quitting: 3.8  . Smokeless tobacco: Never Used  . Tobacco comment: 04/29/2015 "quit smoking cigarettes 02/27/2015"  Substance and Sexual Activity  . Alcohol use: No  . Drug use: No  . Sexual activity: Not Currently    Birth control/protection: Post-menopausal, Surgical

## 2019-01-13 ENCOUNTER — Other Ambulatory Visit (HOSPITAL_COMMUNITY)
Admission: RE | Admit: 2019-01-13 | Discharge: 2019-01-13 | Disposition: A | Discharge status: Home / Self Care | Payer: Medicare PPO | Source: Ambulatory Visit | Attending: Orthopedic Surgery | Admitting: Orthopedic Surgery

## 2019-01-13 DIAGNOSIS — Z20828 Contact with and (suspected) exposure to other viral communicable diseases: Secondary | ICD-10-CM | POA: Diagnosis not present

## 2019-01-13 DIAGNOSIS — Z01812 Encounter for preprocedural laboratory examination: Secondary | ICD-10-CM | POA: Insufficient documentation

## 2019-01-14 LAB — NOVEL CORONAVIRUS, NAA (HOSP ORDER, SEND-OUT TO REF LAB; TAT 18-24 HRS): SARS-CoV-2, NAA: NOT DETECTED

## 2019-01-16 ENCOUNTER — Encounter (HOSPITAL_COMMUNITY): Payer: Self-pay | Admitting: Physician Assistant

## 2019-01-16 ENCOUNTER — Other Ambulatory Visit: Payer: Self-pay

## 2019-01-16 ENCOUNTER — Encounter (HOSPITAL_COMMUNITY): Payer: Self-pay | Admitting: *Deleted

## 2019-01-16 MED ORDER — CLINDAMYCIN PHOSPHATE 900 MG/50ML IV SOLN
900.0000 mg | INTRAVENOUS | Status: AC
  Filled 2019-01-16: qty 50

## 2019-01-16 NOTE — Progress Notes (Signed)
Preop phone call completed. Patient reports that she is still having shob without chest pain. Reports that she has been told she need to see pulmonology. Patient reports that she call walk to one end of her home to the other without stopping but that it takes her a few minutes to recover. Denies cough, CP, or other pain that would reflect referred cardiac pain in a female who is diabetic. Reports that she is about 10 pounds up in weight but has been this way since she seen cardiology on 01/08/2019. Uses Bi-Pap at night. Patient stated she has not stopped Eliquis. Notified Dr. Sharol Given who states he is not in town tomorrow and patient will be rescheduled. Provided the below information regarding DM management. Clear liquids covered that she could have until 0630. Requested anesthesia review chart since patient will be rescheduled.    How do I manage my blood sugar before surgery? . Check your blood sugar at least 4 times a day, starting 2 days before surgery, to make sure that the level is not too high or low. o Check your blood sugar the morning of your surgery when you wake up and every 2 hours until you get to the Short Stay unit. . If your blood sugar is less than 70 mg/dL, you will need to treat for low blood sugar: o Do not take insulin. o Treat a low blood sugar (less than 70 mg/dL) with  cup of clear juice (cranberry or apple), 4 glucose tablets, OR glucose gel. Recheck blood sugar in 15 minutes after treatment (to make sure it is greater than 70 mg/dL). If your blood sugar is not greater than 70 mg/dL on recheck, call [phone] o  for further instructions. . Report your blood sugar to the short stay nurse when you get to Short Stay.  . If you are admitted to the hospital after surgery: o Your blood sugar will be checked by the staff and you will probably be given insulin after surgery (instead of oral diabetes medicines) to make sure you have good blood sugar levels. o The goal for blood sugar  control after surgery is 80-180 mg/dL.   WHAT DO I DO ABOUT MY DIABETES MEDICATION?   Marland Kitchen Do not take oral diabetes medicines (pills) the morning of surgery.  . THE NIGHT BEFORE SURGERY, take __10_________ units of ___Tresiba________insulin.

## 2019-01-18 ENCOUNTER — Encounter: Payer: Self-pay | Admitting: Family Medicine

## 2019-01-18 ENCOUNTER — Ambulatory Visit (INDEPENDENT_AMBULATORY_CARE_PROVIDER_SITE_OTHER): Payer: Medicare PPO

## 2019-01-18 ENCOUNTER — Other Ambulatory Visit: Payer: Self-pay

## 2019-01-18 ENCOUNTER — Telehealth: Payer: Self-pay

## 2019-01-18 ENCOUNTER — Ambulatory Visit (INDEPENDENT_AMBULATORY_CARE_PROVIDER_SITE_OTHER): Payer: Medicare PPO | Admitting: Family Medicine

## 2019-01-18 VITALS — BP 114/70 | HR 98 | Temp 98.3°F | Ht 64.0 in | Wt 178.0 lb

## 2019-01-18 DIAGNOSIS — I5032 Chronic diastolic (congestive) heart failure: Secondary | ICD-10-CM

## 2019-01-18 DIAGNOSIS — R0609 Other forms of dyspnea: Secondary | ICD-10-CM

## 2019-01-18 MED ORDER — FUROSEMIDE 40 MG PO TABS: 40.0000 mg | ORAL_TABLET | Freq: Every day | ORAL | 2 refills | Status: AC

## 2019-01-18 MED ORDER — POTASSIUM CHLORIDE ER 10 MEQ PO TBCR: 10.0000 meq | EXTENDED_RELEASE_TABLET | Freq: Two times a day (BID) | ORAL | 1 refills | Status: AC

## 2019-01-18 NOTE — Telephone Encounter (Signed)
Covid-19 screening questions   Do you now or have you had a fever in the last 14 days? NO   Do you have any respiratory symptoms of shortness of breath or cough now or in the last 14 days? NO   Do you have any family members or close contacts with diagnosed or suspected Covid-19 in the past 14 days? NO   Have you been tested for Covid-19 and found to be positive?NO, tested and was negative.

## 2019-01-18 NOTE — Progress Notes (Signed)
9/10/202011:33 AM  Judith [name] [birth year], 68 y.o., female [id number]  Chief Complaint  Patient presents with  . Shortness of Breath    say she has been having this problem for 3 wks, wakes her in the night. Cards gave her lasix that seems to be working. Having imaging done on the esophagus nx wk  . Herpes Zoster    wants shingle shot, needs rx     HPI:   Patient is a 68 y.o. female with past medical history significant for DM2,afib,CAD,peripheral neuropathy, and right foot amputation,hypomagenesemia, anemia from chronic blood loss,L thalamic ICH, OSA on bipap, asthmawho presents today for SOB  Saw cards Jan 08 2019 for SOB and weight gain Resolved with lasix 20mg  x 5 days, was eating high sodium diet, cleared for EGD and rotator cuff surgery  DOE and Orthopnea for past several 3 weeks Using her cpap Sitting up helps  Taking lasix 20mg  once a day with KCL Having wheezing, having mild productive cough Using albuterol 4 x day - helps but does not resolve No fever or chills, minimal swelling in her left ankle Last night had mild chest pressure after taking a shower, lasted about 10 minutes, used inhaler covid neg sept 5th No abd pain, nausea, vomiting, melena Reflux much better since added carafate Has been mindful of following low salt diet  Echo 2020 - normal LVEF, G1DD nm stress test June 2020 - low risk  CBC Latest Ref Rng & Units 01/08/2019 11/17/2018 10/13/2018  WBC 4.0 - 10.5 K/uL 7.4 8.0 9.2  Hemoglobin 12.0 - 15.0 g/dL 10.1 Repeated and verified X2.(L) 11.1 9.5(L)  Hematocrit 36.0 - 46.0 % 30.7(L) 32.7(L) 29.2(L)  Platelets 150.0 - 400.0 K/uL 366.0 352 387    Depression screen [facility] 2/9 01/18/2019 11/17/2018 10/18/2018  Decreased Interest 0 0 0  Down, Depressed, Hopeless 0 0 0  PHQ - 2 Score 0 0 0  Some recent data might be hidden    Fall Risk  01/18/2019 01/18/2019 11/17/2018 10/18/2018 09/05/2018  Falls in the past year? 1 0 0 0 0  Number falls in past yr: 0  0 0 0 -  Injury with Fall? 0 0 0 0 0  Comment - - - - -  Risk Factor Category  - - - - -  Risk for fall due to : - Impaired mobility;Impaired balance/gait - - -  Risk for fall due to: Comment - - - - -  Follow up - - - - -     Allergies  Allergen Reactions  . Contrast Media [Iodinated Diagnostic Agents] Hives and Other (See Comments)    Spoke to patient, Iodine allergy is really IV contrast allergy.   [name] [Insulin Aspart] Shortness Of Breath and Other (See Comments)    "breathing problems"  . Codeine Nausea And Vomiting and Other (See Comments)    HIGH DOSES-SEVERE VOMITING  . Iodine Other (See Comments)    MUST HAVE BENADRYL PRIOR TO PROCEDURE AND RIGHT BEFORE TREATMENT TO COUNTERACT REACTION-BLISTERING REACTION DERMATOLOGICAL  . Penicillins Itching, Rash and Other (See Comments)    Has patient had a PCN reaction causing immediate rash, facial/tongue/throat swelling, SOB or lightheadedness with hypotension: no Has patient had a PCN reaction causing severe rash involving mucus membranes or skin necrosis: No Has patient had a PCN reaction that required hospitalization No Has patient had a PCN reaction occurring within the last 10 years: No If all of the above answers are "NO", then may proceed with  Cephalosporin use.  CHEST SIZED RASH AND ITCHING   . Ace Inhibitors Cough  . Demerol [Meperidine] Nausea And Vomiting  . Dilaudid [Hydromorphone Hcl] Other (See Comments)    HEADACHE   . Neosporin [Neomycin-Bacitracin Zn-Polymyx] Itching, Rash and Other (See Comments)    MAKES REACTIONS WORSE WHEN USING AS PROPHYLACTIC  . Percocet [Oxycodone-Acetaminophen] Rash  . Tape Itching and Rash    Prior to Admission medications   Medication Sig Start Date End Date Taking? Authorizing Provider  albuterol (PROVENTIL HFA) 108 (90 Base) MCG/ACT inhaler Inhale 1-2 puffs into the lungs every 6 (six) hours as needed for wheezing or shortness of breath. 05/01/18  Yes Angiulli, Lavon Paganini, PA-C   amiodarone (PACERONE) 200 MG tablet Take 1 tablet (200 mg total) by mouth daily. Patient taking differently: Take 200 mg by mouth See admin instructions. Take 1 tablet (200 mg) by mouth scheduled every morning, may repeat dose if needed for afib 06/08/18  Yes Sherran Needs, NP  atorvastatin (LIPITOR) 20 MG tablet Take 1 tablet (20 mg total) by mouth daily. 05/01/18  Yes Angiulli, Lavon Paganini, PA-C  buPROPion (WELLBUTRIN) 75 MG tablet Take 75 mg by mouth 2 (two) times daily.   Yes [provider]  cetirizine (ZYRTEC) 10 MG tablet Take 10 mg by mouth daily.   Yes [provider]  diltiazem (CARTIA XT) 120 MG 24 hr capsule Take 120 mg by mouth daily.   Yes [provider]  ELIQUIS 5 MG TABS tablet Take 1 tablet by mouth twice daily Patient taking differently: Take 5 mg by mouth 2 (two) times daily.  11/21/18  Yes Turner, Eber Hong, MD  famotidine (PEPCID) 20 MG tablet Take 1 tablet (20 mg total) by mouth 2 (two) times daily. 06/17/18  Yes Rutherford Guys, MD  FLUoxetine (PROZAC) 20 MG capsule TAKE 1 CAPSULE AT BEDTIME Patient taking differently: Take 20 mg by mouth at bedtime.  01/03/19  Yes Rutherford Guys, MD  furosemide (LASIX) 20 MG tablet Take 20 mg by mouth daily as needed (for ankles and hand swelling).    Yes [provider]  hydrALAZINE (APRESOLINE) 50 MG tablet Take 1 tablet (50 mg total) by mouth 3 (three) times daily. 10/13/18  Yes Isaiah Serge, NP  insulin degludec (TRESIBA FLEXTOUCH) 100 UNIT/ML SOPN FlexTouch Pen Inject 20 Units into the skin at bedtime.    Yes [provider]  loratadine (CLARITIN) 10 MG tablet Take 10 mg by mouth at bedtime.   Yes [provider]  magnesium oxide (MAG-OX) 400 MG tablet Take 400 mg by mouth at bedtime.    Yes [provider]  Melatonin 10 MG TABS Take 10 mg by mouth at bedtime.    Yes [provider]  metFORMIN (GLUCOPHAGE-XR) 500 MG 24 hr tablet Take 1,000 mg by mouth daily with  breakfast.   Yes [provider]  methimazole (TAPAZOLE) 10 MG tablet TAKE 1 TABLET (10 MG TOTAL) BY MOUTH DAILY. 12/10/18  Yes Renato Shin, MD  metoprolol succinate (TOPROL-XL) 25 MG 24 hr tablet Take 25 mg by mouth 2 (two) times daily.   Yes [provider]  mupirocin ointment (BACTROBAN) 2 % Apply 1 application topically 2 (two) times daily as needed (wound/sores).    Yes [provider]  nitroGLYCERIN (NITROSTAT) 0.4 MG SL tablet Place 0.4 mg under the tongue every 5 (five) minutes x 3 doses as needed for chest pain.    Yes [provider]  polyethylene glycol  powder (GLYCOLAX/MIRALAX) 17 GM/SCOOP powder Take 17 g by mouth daily as needed (constipation.).    Yes [provider]  potassium chloride (K-DUR) 10 MEQ tablet Take 10 mEq by mouth daily as needed (when taking furosemide).    Yes [provider]  Respiratory Therapy Supplies DEVI BI-PAP @@ Bedtime   Yes [provider]  sucralfate (CARAFATE) 1 g tablet Take 1 tablet (1 g total) by mouth 4 (four) times daily -  with meals and at bedtime. 01/08/19  Yes Nandigam, Venia Minks, MD  triamcinolone (KENALOG) 0.1 % paste Use as directed 1 application in the mouth or throat 2 (two) times daily as needed (ulcer lesion.).  10/18/18  Yes [provider]  TRUE METRIX BLOOD GLUCOSE TEST test strip TEST BLOOD SUGAR TWICE DAILY 09/16/18  Yes Renato Shin, MD    Past Medical History:  Diagnosis Date  . Abnormal EKG 07/31/2013  . Arthritis    "hands" (03/06/2015)  . Asthma   . Carpal tunnel syndrome, bilateral   . Chronic kidney disease   . Complication of anesthesia    slow to wake up. Pt sts she woke up during surgery many years ago.  . Coronary artery disease     2 v CAD with CTO of the RCA and high grade bifurcational LCx/OM stenosis. S/P PCI DES x 2 to the LCx/OM.  Marland Kitchen Diabetic peripheral neuropathy ([place]) "since 1996"  . GERD (gastroesophageal reflux disease)   . Goiter   .  Headache    migraines prior to menopause  . History of shingles 06/01/2013  . Hyperlipidemia LDL goal <70 10/13/2015  . Hypertension   . Hyperthyroidism   . Osteomyelitis of foot (HCC)    Right  . PAF (paroxysmal atrial fibrillation) ([place]) 04/29/2015   CHADS2VASC score of 5 now on Apixaban  . Pneumonia ~ 1976  . Sleep apnea    Bipap  . Tremors of nervous system   . Type II diabetes mellitus (HCC)    insulin dependent    Past Surgical History:  Procedure Laterality Date  . ABDOMINAL HYSTERECTOMY  1988   age 57; CERVICAL DYSPLASIA; ovaries intact.   . AMPUTATION Right 01/23/2016   Procedure: Right 3rd Ray Amputation;  Surgeon: [name], MD;  Location: [place];  Service: Orthopedics;  Laterality: Right;  . AMPUTATION Right 02/13/2016   Procedure: Right Transmetatarsal Amputation;  Surgeon: [name], MD;  Location: [place];  Service: Orthopedics;  Laterality: Right;  . AMPUTATION Right 04/15/2018   Procedure: AMPUTATION BELOW KNEE;  Surgeon: [name], MD;  Location: [place];  Service: Orthopedics;  Laterality: Right;  . BIOPSY  04/16/2018   Procedure: BIOPSY;  Surgeon: Irene Shipper, MD;  Location: [facility] ENDOSCOPY;  Service: Endoscopy;;  . CARDIAC CATHETERIZATION N/A 02/27/2015   Procedure: Left Heart Cath and Coronary Angiography;  Surgeon: [name], MD; LAD 40%, mCFX 80%, OM 70%, RCA 100% calcified       . CARDIAC CATHETERIZATION N/A 03/06/2015   Procedure: Coronary Stent Intervention;  Surgeon: [name], MD;  Location: [place] [facility];  Service: Cardiovascular;  Laterality: N/A;  Mid CX 3.50x12 promus DES w/ 0% resdual and Prox OM1 2.50x20 promus DES w/ 20% residual  . CARDIOVERSION    . CARDIOVERSION N/A 05/22/2018   Procedure: CARDIOVERSION;  Surgeon: Skeet Latch, MD;  Location: [place];  Service: Cardiovascular;  Laterality: N/A;  . CARPAL TUNNEL RELEASE Right Nov 2015  . CARPAL TUNNEL RELEASE Right 1992; 05/2014  [name]; [place]  . CESAREAN  SECTION  1982; 1984  . ESOPHAGOGASTRODUODENOSCOPY (EGD) WITH PROPOFOL N/A 04/16/2018   Procedure: ESOPHAGOGASTRODUODENOSCOPY (EGD) WITH PROPOFOL;  Surgeon: Irene Shipper, MD;  [facility];  Service: Endoscopy;  Laterality: N/A;  . FOOT NEUROMA SURGERY Bilateral 2000  . I&D EXTREMITY Left 01/06/2018   Procedure: DEBRIDEMENT ULCER LEFT FOOT;  Surgeon: [name], MD;  Location: [place];  Service: Orthopedics;  Laterality: Left;  . KNEE ARTHROSCOPY Right ~ 2003   "meniscus repair"  . LIPOMA EXCISION Right 2020   thigh  . SHOULDER OPEN ROTATOR CUFF REPAIR Right 1996; 1998   "w/fracture repair"  . THYROID SURGERY  2000   "removed lots of nodules"  . TONSILLECTOMY  1976    Social History   Tobacco Use  . Smoking status: Former Smoker    Packs/day: 0.00    Years: 41.00    Pack years: 0.00    Types: Cigarettes    Quit date: 03/05/2015    Years since quitting: 3.8  . Smokeless tobacco: Never Used  . Tobacco comment: 04/29/2015 "quit smoking cigarettes 02/27/2015"  Substance Use Topics  . Alcohol use: No    Family History  Problem Relation Age of Onset  . Cancer Mother 43       bronchial cancer  . Breast cancer Mother   . Lung cancer Mother   . Hypertension Father   . COPD Father   . Heart disease Father 52       CAD with cardiac stenting  . Heart attack Father   . Parkinson's disease Father   . Allergies Sister   . Breast cancer Maternal Grandmother   . Emphysema Maternal Grandfather   . Leukemia Paternal Grandmother   . Emphysema Paternal Grandfather   . Thyroid disease Neg Hx   . Colon cancer Neg Hx     ROS Per hpi  OBJECTIVE:  Today's Vitals   01/18/19 1126  BP: 114/70  Pulse: 98  Temp: 98.3 F (36.8 C)  TempSrc: Oral  SpO2: 98%  Weight: 178 lb (80.7 kg)  Height: 5\' 4"  (1.626 m)   Body mass index is 30.55 kg/m.   Wt Readings from Last 3 Encounters:  01/18/19 178 lb (80.7 kg)  01/09/19 173 lb (78.5 kg)  01/08/19 173 lb 6.4 oz (78.7 kg)      Physical Exam Vitals signs and nursing note reviewed.  Constitutional:      Appearance: She is well-developed.  HENT:     Head: Normocephalic and atraumatic.     Mouth/Throat:     Pharynx: No oropharyngeal exudate.  Eyes:     General: No scleral icterus.    Conjunctiva/sclera: Conjunctivae normal.     Pupils: Pupils are equal, round, and reactive to light.  Neck:     Musculoskeletal: Neck supple.     Thyroid: No thyromegaly.     Vascular: No JVD.  Cardiovascular:     Rate and Rhythm: Normal rate and regular rhythm.     Heart sounds: Normal heart sounds. No murmur. No friction rub. No gallop.   Pulmonary:     Effort: Pulmonary effort is normal.     Breath sounds: Normal breath sounds. No decreased breath sounds, wheezing, rhonchi or rales.  Abdominal:     Palpations: Abdomen is soft. There is no hepatomegaly, splenomegaly or mass.     Tenderness: There is no abdominal tenderness.  Musculoskeletal:     Left lower leg: Edema (trace pitting up to  distal chin) present.  Lymphadenopathy:     Cervical: No cervical adenopathy.  Skin:    General: Skin is warm and dry.  Neurological:     Mental Status: She is alert and oriented to person, place, and time.     No results found for this or any previous visit (from the past 24 hour(s)).  Dg Chest 2 View  Result Date: 01/18/2019 CLINICAL DATA:  Dyspnea on exertion 3 weeks. EXAM: CHEST - 2 VIEW COMPARISON:  05/14/2018 FINDINGS: Lungs are adequately inflated with subtle prominence of the infrahilar markings likely minimal vascular congestion. Small amount right pleural fluid likely with associated mild right basilar atelectasis. Borderline cardiomegaly. Remainder of the exam is unchanged. Metallic wirelike structure over the left neck of uncertain clinical significance. IMPRESSION: Findings suggesting minimal vascular congestion with small right pleural effusion. Borderline cardiomegaly. Electronically Signed   By: Marin Olp M.D.    On: 01/18/2019 12:14   Peak flow reading is 200, about 56 % of predicted.  ASSESSMENT and PLAN  1. Chronic diastolic congestive heart failure (HCC) Increase lasix to 40mg  daily, increase KCL to 40meq daily, discussed salt and fluid restrictions. RTC precautions given.  2. DOE (dyspnea on exertion) - Comprehensive metabolic panel - DG Chest 2 View; Future - Pro b natriuretic peptide - CBC  3. Hypomagnesemia - Magnesium  Other orders - Check Peak Flow - furosemide (LASIX) 40 MG tablet; Take 1 tablet (40 mg total) by mouth daily. - potassium chloride (K-DUR) 10 MEQ tablet; Take 1 tablet (10 mEq total) by mouth 2 (two) times daily.  Return in about 4 days (around 01/22/2019) for DOE.    Rutherford Guys, MD Primary [facility] at [facility] [place], [place] [address] Ph.  [phone] Fax [phone]

## 2019-01-19 ENCOUNTER — Telehealth: Payer: Self-pay | Admitting: *Deleted

## 2019-01-19 ENCOUNTER — Encounter: Payer: Self-pay | Admitting: Gastroenterology

## 2019-01-19 ENCOUNTER — Ambulatory Visit (AMBULATORY_SURGERY_CENTER): Payer: Medicare PPO | Admitting: Gastroenterology

## 2019-01-19 VITALS — BP 122/83 | HR 103 | Temp 98.3°F | Resp 20 | Ht 64.0 in | Wt 172.0 lb

## 2019-01-19 DIAGNOSIS — I4891 Unspecified atrial fibrillation: Secondary | ICD-10-CM | POA: Diagnosis not present

## 2019-01-19 DIAGNOSIS — G8929 Other chronic pain: Secondary | ICD-10-CM

## 2019-01-19 DIAGNOSIS — K219 Gastro-esophageal reflux disease without esophagitis: Secondary | ICD-10-CM | POA: Diagnosis not present

## 2019-01-19 DIAGNOSIS — I2510 Atherosclerotic heart disease of native coronary artery without angina pectoris: Secondary | ICD-10-CM | POA: Diagnosis not present

## 2019-01-19 DIAGNOSIS — K2970 Gastritis, unspecified, without bleeding: Secondary | ICD-10-CM | POA: Diagnosis not present

## 2019-01-19 DIAGNOSIS — K2100 Gastro-esophageal reflux disease with esophagitis, without bleeding: Secondary | ICD-10-CM

## 2019-01-19 DIAGNOSIS — R1013 Epigastric pain: Secondary | ICD-10-CM | POA: Diagnosis not present

## 2019-01-19 DIAGNOSIS — K2950 Unspecified chronic gastritis without bleeding: Secondary | ICD-10-CM | POA: Diagnosis not present

## 2019-01-19 DIAGNOSIS — D500 Iron deficiency anemia secondary to blood loss (chronic): Secondary | ICD-10-CM

## 2019-01-19 LAB — COMPREHENSIVE METABOLIC PANEL
ALT: 15 IU/L (ref 0–32)
AST: 18 IU/L (ref 0–40)
Albumin/Globulin Ratio: 1.3 (ref 1.2–2.2)
Albumin: 3.6 g/dL — ABNORMAL LOW (ref 3.8–4.8)
Alkaline Phosphatase: 120 IU/L — ABNORMAL HIGH (ref 39–117)
BUN/Creatinine Ratio: 17 (ref 12–28)
BUN: 22 mg/dL (ref 8–27)
Bilirubin Total: 0.3 mg/dL (ref 0.0–1.2)
CO2: 26 mmol/L (ref 20–29)
Calcium: 9.5 mg/dL (ref 8.7–10.3)
Chloride: 100 mmol/L (ref 96–106)
Creatinine, Ser: 1.31 mg/dL — ABNORMAL HIGH (ref 0.57–1.00)
GFR calc Af Amer: 49 mL/min/{1.73_m2} — ABNORMAL LOW (ref >59)
GFR calc non Af Amer: 42 mL/min/{1.73_m2} — ABNORMAL LOW (ref >59)
Globulin, Total: 2.7 g/dL (ref 1.5–4.5)
Glucose: 127 mg/dL — ABNORMAL HIGH (ref 65–99)
Potassium: 4.3 mmol/L (ref 3.5–5.2)
Sodium: 139 mmol/L (ref 134–144)
Total Protein: 6.3 g/dL (ref 6.0–8.5)

## 2019-01-19 LAB — CBC
Hematocrit: 30.2 % — ABNORMAL LOW (ref 34.0–46.6)
Hemoglobin: 9.5 g/dL — ABNORMAL LOW (ref 11.1–15.9)
MCH: 28.5 pg (ref 26.6–33.0)
MCHC: 31.5 g/dL (ref 31.5–35.7)
MCV: 91 fL (ref 79–97)
Platelets: 377 10*3/uL (ref 150–450)
RBC: 3.33 x10E6/uL — ABNORMAL LOW (ref 3.77–5.28)
RDW: 14.3 % (ref 11.7–15.4)
WBC: 6.4 10*3/uL (ref 3.4–10.8)

## 2019-01-19 LAB — MAGNESIUM: Magnesium: 2 mg/dL (ref 1.6–2.3)

## 2019-01-19 LAB — PRO B NATRIURETIC PEPTIDE: NT-Pro BNP: 2645 pg/mL — ABNORMAL HIGH (ref 0–301)

## 2019-01-19 MED ORDER — SODIUM CHLORIDE 0.9 % IV SOLN: 500.0000 mL | Freq: Once | INTRAVENOUS | Status: AC

## 2019-01-19 NOTE — Op Note (Signed)
[facility] Patient Name: Judith [name] Procedure Date: 01/19/2019 10:36 AM MRN: [id number] Endoscopist: Mauri Pole , MD Age: 68 Referring MD:  Date of Birth: June 27, 1950 Gender: Female Account #: [id number] Procedure:                Upper GI endoscopy Indications:              Suspected upper gastrointestinal bleeding in                            patient with unexplained iron deficiency anemia,                            Epigastric abdominal pain Medicines:                Monitored Anesthesia Care Procedure:                Pre-Anesthesia Assessment:                           - Prior to the procedure, a History and Physical                            was performed, and patient medications and                            allergies were reviewed. The patient's tolerance of                            previous anesthesia was also reviewed. The risks                            and benefits of the procedure and the sedation                            options and risks were discussed with the patient.                            All questions were answered, and informed consent                            was obtained. Prior Anticoagulants: The patient                            last took Eliquis (apixaban) 2 days prior to the                            procedure. ASA Grade Assessment: III - A patient                            with severe systemic disease. After reviewing the                            risks and benefits, the patient was deemed in  satisfactory condition to undergo the procedure.                           After obtaining informed consent, the endoscope was                            passed under direct vision. Throughout the                            procedure, the patient's blood pressure, pulse, and                            oxygen saturations were monitored continuously. The                            Endoscope was introduced  through the mouth, and                            advanced to the second part of duodenum. The upper                            GI endoscopy was accomplished without difficulty.                            The patient tolerated the procedure well. Scope In: Scope Out: Findings:                 Bile was found in the entire esophagus.                           Patchy mild inflammation characterized by                            congestion (edema), cobble stone appearance was                            found in the entire examined stomach. Biopsies were                            taken with a cold forceps for histology.                           The examined duodenum was normal. Complications:            No immediate complications. Estimated Blood Loss:     Estimated blood loss was minimal. Impression:               - Bile in the esophagus.                           - Gastritis. Biopsied.                           - Normal examined duodenum. Recommendation:           - Resume previous diet.                           -  Continue present medications.                           - Await pathology results.                           - Return to GI clinic at the next available                            appointment.                           - Resume Eliquis (apixaban) at prior dose today. Mauri Pole, MD 01/19/2019 10:57:04 AM This report has been signed electronically.

## 2019-01-19 NOTE — Telephone Encounter (Signed)
   [facility] Pre-operative Risk Assessment    Request for surgical clearance:  1. What type of surgery is being performed? RIGHT SHOULDER ARTHROSCOPY & DEBRIDEMENT; EXCISION OF RIGHT THIGH MASS   2. When is this surgery scheduled? TBD   3. What type of clearance is required (medical clearance vs. Pharmacy clearance to hold med vs. Both)? BOTH  4. Are there any medications that need to be held prior to surgery and how long? ELIQUIS    5. Practice name and name of physician performing surgery? [facility]; DR. DUDA   6. What is your office phone number [phone]    7.   What is your office fax number [phone]  8.   Anesthesia type (None, local, MAC, general) ? GENERAL   Julaine Hua 01/19/2019, 10:10 AM  _________________________________________________________________   (provider comments below)

## 2019-01-19 NOTE — Progress Notes (Signed)
Report given to PACU, vss 

## 2019-01-19 NOTE — Patient Instructions (Signed)
Resume Eliquis today. Await pathology results.  YOU HAD AN ENDOSCOPIC PROCEDURE TODAY AT [facility]:   Refer to the procedure report that was given to you for any specific questions about what was found during the examination.  If the procedure report does not answer your questions, please call your gastroenterologist to clarify.  If you requested that your care partner not be given the details of your procedure findings, then the procedure report has been included in a sealed envelope for you to review at your convenience later.  YOU SHOULD EXPECT: Some feelings of bloating in the abdomen. Passage of more gas than usual.  Walking can help get rid of the air that was put into your GI tract during the procedure and reduce the bloating. If you had a lower endoscopy (such as a colonoscopy or flexible sigmoidoscopy) you may notice spotting of blood in your stool or on the toilet paper. If you underwent a bowel prep for your procedure, you may not have a normal bowel movement for a few days.  Please Note:  You might notice some irritation and congestion in your nose or some drainage.  This is from the oxygen used during your procedure.  There is no need for concern and it should clear up in a day or so.  SYMPTOMS TO REPORT IMMEDIATELY:    Following upper endoscopy (EGD)  Vomiting of blood or coffee ground material  New chest pain or pain under the shoulder blades  Painful or persistently difficult swallowing  New shortness of breath  Fever of 100F or higher  Black, tarry-looking stools  For urgent or emergent issues, a gastroenterologist can be reached at any hour by calling [phone].   DIET:  We do recommend a small meal at first, but then you may proceed to your regular diet.  Drink plenty of fluids but you should avoid alcoholic beverages for 24 hours.  ACTIVITY:  You should plan to take it easy for the rest of today and you should NOT DRIVE or use heavy machinery  until tomorrow (because of the sedation medicines used during the test).    FOLLOW UP: Our staff will call the number listed on your records 48-72 hours following your procedure to check on you and address any questions or concerns that you may have regarding the information given to you following your procedure. If we do not reach you, we will leave a message.  We will attempt to reach you two times.  During this call, we will ask if you have developed any symptoms of COVID 19. If you develop any symptoms (ie: fever, flu-like symptoms, shortness of breath, cough etc.) before then, please call [phone].  If you test positive for Covid 19 in the 2 weeks post procedure, please call and report this information to [place].    If any biopsies were taken you will be contacted by phone or by letter within the next 1-3 weeks.  Please call us at [phone] if you have not heard about the biopsies in 3 weeks.    SIGNATURES/CONFIDENTIALITY: You and/or your care partner have signed paperwork which will be entered into your electronic medical record.  These signatures attest to the fact that that the information above on your After Visit Summary has been reviewed and is understood.  Full responsibility of the confidentiality of this discharge information lies with you and/or your care-partner.  Thank you for letting us take care of your healthcare needs today.

## 2019-01-19 NOTE — Progress Notes (Signed)
Called to room to assist during endoscopic procedure.  Patient ID and intended procedure confirmed with present staff. Received instructions for my participation in the procedure from the performing physician.  

## 2019-01-19 NOTE — Progress Notes (Addendum)
Temp taken by JB VS taken by CW 

## 2019-01-19 NOTE — Telephone Encounter (Signed)
Patient with diagnosis of afib on Eliquis for anticoagulation.    Procedure: RIGHT SHOULDER ARTHROSCOPY & DEBRIDEMENT; EXCISION OF RIGHT THIGH MASS  Date of procedure: TBD  CHADS2-VASc score of  6 (CHF, HTN, AGE, DM2, CAD, female)  CrCl 61ml/min  Per office protocol, patient can hold Elqiuis for 2 days prior to procedure.

## 2019-01-19 NOTE — Telephone Encounter (Signed)
   Left voicemail for patient to call back to discuss preop assessment.   Abigail Butts, PA-C 01/19/2019, 2:52 PM

## 2019-01-22 ENCOUNTER — Ambulatory Visit (INDEPENDENT_AMBULATORY_CARE_PROVIDER_SITE_OTHER): Payer: Medicare PPO | Admitting: Family Medicine

## 2019-01-22 ENCOUNTER — Other Ambulatory Visit: Payer: Self-pay

## 2019-01-22 ENCOUNTER — Encounter: Payer: Self-pay | Admitting: Family Medicine

## 2019-01-22 VITALS — BP 124/87 | HR 100 | Temp 97.2°F | Ht 64.0 in | Wt 178.0 lb

## 2019-01-22 DIAGNOSIS — I5032 Chronic diastolic (congestive) heart failure: Secondary | ICD-10-CM

## 2019-01-22 DIAGNOSIS — D649 Anemia, unspecified: Secondary | ICD-10-CM

## 2019-01-22 DIAGNOSIS — R160 Hepatomegaly, not elsewhere classified: Secondary | ICD-10-CM | POA: Diagnosis not present

## 2019-01-22 DIAGNOSIS — R0609 Other forms of dyspnea: Secondary | ICD-10-CM | POA: Diagnosis not present

## 2019-01-22 MED ORDER — FLOVENT HFA 44 MCG/ACT IN AERO: 2.0000 | INHALATION_SPRAY | Freq: Two times a day (BID) | RESPIRATORY_TRACT | 3 refills | Status: AC

## 2019-01-22 NOTE — Progress Notes (Signed)
9/14/202010:31 AM  Judith [name] 04/19/1951, 68 y.o., female [id number]  Chief Complaint  Patient presents with  . Shortness of Breath    says breathing has gotten a little better    HPI:   Patient is a 68 y.o. female with past medical history significant for DM2,afib,CAD,peripheral neuropathy, and right foot amputation,hypomagenesemia, anemia,L thalamic ICH, OSA on bipap, asthmawho presents today for followup SOB  Seen last week Increased lasix to 40mg  - reports that her SOB is minimally improved. Still most present at night when she lies down, feels like her asthma then Was supposed to see cards today, she needs to reschedule Had EGD on Friday  - Bile in the esophagus. - Gastritis. Biopsied. - Normal examined duodenum.  Lab Results  Component Value Date   CREATININE 1.31 (H) 01/18/2019   BUN 22 01/18/2019   NA 139 01/18/2019   K 4.3 01/18/2019   CL 100 01/18/2019   CO2 26 01/18/2019   CBC Latest Ref Rng & Units 01/18/2019 01/08/2019 11/17/2018  WBC 3.4 - 10.8 x10E3/uL 6.4 7.4 8.0  Hemoglobin 11.1 - 15.9 g/dL 9.5(L) 10.1 Repeated and verified X2.(L) 11.1  Hematocrit 34.0 - 46.6 % 30.2(L) 30.7(L) 32.7(L)  Platelets 150 - 450 x10E3/uL 377 366.0 352   Lab Results  Component Value Date   IRON 64 10/18/2018   TIBC 298 10/18/2018   FERRITIN 116 10/18/2018   Pro-bnp: 2645  Depression screen PHQ 2/9 01/22/2019 01/18/2019 11/17/2018  Decreased Interest 0 0 0  Down, Depressed, Hopeless 0 0 0  PHQ - 2 Score 0 0 0  Some recent data might be hidden    Fall Risk  01/22/2019 01/18/2019 01/18/2019 11/17/2018 10/18/2018  Falls in the past year? 0 1 0 0 0  Number falls in past yr: 0 0 0 0 0  Injury with Fall? 0 0 0 0 0  Comment - - - - -  Risk Factor Category  - - - - -  Risk for fall due to : - - Impaired mobility;Impaired balance/gait - -  Risk for fall due to: Comment - - - - -  Follow up - - - - -     Allergies  Allergen Reactions  . Contrast Media [Iodinated  Diagnostic Agents] Hives and Other (See Comments)    Spoke to patient, Iodine allergy is really IV contrast allergy.   [name] [Insulin Aspart] Shortness Of Breath and Other (See Comments)    "breathing problems"  . Codeine Nausea And Vomiting and Other (See Comments)    HIGH DOSES-SEVERE VOMITING  . Iodine Other (See Comments)    MUST HAVE BENADRYL PRIOR TO PROCEDURE AND RIGHT BEFORE TREATMENT TO COUNTERACT REACTION-BLISTERING REACTION DERMATOLOGICAL  . Penicillins Itching, Rash and Other (See Comments)    Has patient had a PCN reaction causing immediate rash, facial/tongue/throat swelling, SOB or lightheadedness with hypotension: no Has patient had a PCN reaction causing severe rash involving mucus membranes or skin necrosis: No Has patient had a PCN reaction that required hospitalization No Has patient had a PCN reaction occurring within the last 10 years: No If all of the above answers are "NO", then may proceed with Cephalosporin use.  CHEST SIZED RASH AND ITCHING   . Ace Inhibitors Cough  . Demerol [Meperidine] Nausea And Vomiting  . Dilaudid [Hydromorphone Hcl] Other (See Comments)    HEADACHE   . Neosporin [Neomycin-Bacitracin Zn-Polymyx] Itching, Rash and Other (See Comments)    MAKES REACTIONS WORSE WHEN USING AS PROPHYLACTIC  .  Percocet [Oxycodone-Acetaminophen] Rash  . Tape Itching and Rash    Prior to Admission medications   Medication Sig Start Date End Date Taking? Authorizing Provider  albuterol (PROVENTIL HFA) 108 (90 Base) MCG/ACT inhaler Inhale 1-2 puffs into the lungs every 6 (six) hours as needed for wheezing or shortness of breath. 05/01/18  Yes Angiulli, Lavon Paganini, PA-C  amiodarone (PACERONE) 200 MG tablet Take 1 tablet (200 mg total) by mouth daily. Patient taking differently: Take 200 mg by mouth See admin instructions. Take 1 tablet (200 mg) by mouth scheduled every morning, may repeat dose if needed for afib 06/08/18  Yes Sherran Needs, NP  atorvastatin  (LIPITOR) 20 MG tablet Take 1 tablet (20 mg total) by mouth daily. 05/01/18  Yes Angiulli, Lavon Paganini, PA-C  buPROPion (WELLBUTRIN) 75 MG tablet Take 75 mg by mouth 2 (two) times daily.   Yes [provider]  cetirizine (ZYRTEC) 10 MG tablet Take 10 mg by mouth daily.   Yes [provider]  diltiazem (CARTIA XT) 120 MG 24 hr capsule Take 120 mg by mouth daily.   Yes [provider]  ELIQUIS 5 MG TABS tablet Take 1 tablet by mouth twice daily Patient taking differently: Take 5 mg by mouth 2 (two) times daily.  11/21/18  Yes Turner, Eber Hong, MD  famotidine (PEPCID) 20 MG tablet Take 1 tablet (20 mg total) by mouth 2 (two) times daily. 06/17/18  Yes Rutherford Guys, MD  FLUoxetine (PROZAC) 20 MG capsule TAKE 1 CAPSULE AT BEDTIME Patient taking differently: Take 20 mg by mouth at bedtime.  01/03/19  Yes Rutherford Guys, MD  furosemide (LASIX) 40 MG tablet Take 1 tablet (40 mg total) by mouth daily. 01/18/19  Yes Rutherford Guys, MD  hydrALAZINE (APRESOLINE) 50 MG tablet Take 1 tablet (50 mg total) by mouth 3 (three) times daily. 10/13/18  Yes Isaiah Serge, NP  insulin degludec (TRESIBA FLEXTOUCH) 100 UNIT/ML SOPN FlexTouch Pen Inject 20 Units into the skin at bedtime.    Yes [provider]  loratadine (CLARITIN) 10 MG tablet Take 10 mg by mouth at bedtime.   Yes [provider]  magnesium oxide (MAG-OX) 400 MG tablet Take 400 mg by mouth at bedtime.    Yes [provider]  Melatonin 10 MG TABS Take 10 mg by mouth at bedtime.    Yes [provider]  metFORMIN (GLUCOPHAGE-XR) 500 MG 24 hr tablet Take 1,000 mg by mouth daily with breakfast.   Yes [provider]  methimazole (TAPAZOLE) 10 MG tablet TAKE 1 TABLET (10 MG TOTAL) BY MOUTH DAILY. 12/10/18  Yes Renato Shin, MD  metoprolol succinate (TOPROL-XL) 25 MG 24 hr tablet Take 25 mg by mouth 2 (two) times daily.   Yes [provider]  mupirocin ointment (BACTROBAN) 2 % Apply 1  application topically 2 (two) times daily as needed (wound/sores).    Yes [provider]  nitroGLYCERIN (NITROSTAT) 0.4 MG SL tablet Place 0.4 mg under the tongue every 5 (five) minutes x 3 doses as needed for chest pain.    Yes [provider]  polyethylene glycol powder (GLYCOLAX/MIRALAX) 17 GM/SCOOP powder Take 17 g by mouth daily as needed (constipation.).    Yes [provider]  potassium chloride (K-DUR) 10 MEQ tablet Take 1 tablet (10 mEq total) by mouth 2 (two) times daily. 01/18/19  Yes Rutherford Guys, MD  Respiratory Therapy Supplies DEVI BI-PAP [phone] Bedtime   Yes [provider]  sucralfate (CARAFATE) 1 g tablet Take 1 tablet (1 g total) by mouth 4 (four) times daily -  with meals and at bedtime. 01/08/19  Yes Nandigam, Venia Minks, MD  triamcinolone (KENALOG) 0.1 % paste Use as directed 1 application in the mouth or throat 2 (two) times daily as needed (ulcer lesion.).  10/18/18  Yes [provider]  TRUE METRIX BLOOD GLUCOSE TEST test strip TEST BLOOD SUGAR TWICE DAILY 09/16/18  Yes Renato Shin, MD    Past Medical History:  Diagnosis Date  . Abnormal EKG 07/31/2013  . Arthritis    "hands" (03/06/2015)  . Asthma   . Carpal tunnel syndrome, bilateral   . Chronic kidney disease   . Complication of anesthesia    slow to wake up. Pt sts she woke up during surgery many years ago.  . Coronary artery disease     2 v CAD with CTO of the RCA and high grade bifurcational LCx/OM stenosis. S/P PCI DES x 2 to the LCx/OM.  Marland Kitchen Diabetic peripheral neuropathy ([place]) "since 1996"  . GERD (gastroesophageal reflux disease)   . Goiter   . Headache    migraines prior to menopause  . History of shingles 06/01/2013  . Hyperlipidemia LDL goal <70 10/13/2015  . Hypertension   . Hyperthyroidism   . Osteomyelitis of foot (HCC)    Right  . PAF (paroxysmal atrial fibrillation) ([place]) 04/29/2015   CHADS2VASC score of 5 now on Apixaban  . Pneumonia ~ 1976  . Sleep  apnea    Bipap  . Tremors of nervous system   . Type II diabetes mellitus (HCC)    insulin dependent    Past Surgical History:  Procedure Laterality Date  . ABDOMINAL HYSTERECTOMY  1988   age 81; CERVICAL DYSPLASIA; ovaries intact.   . AMPUTATION Right 01/23/2016   Procedure: Right 3rd Ray Amputation;  Surgeon: [name], MD;  Location: [facility];  Service: Orthopedics;  Laterality: Right;  . AMPUTATION Right 02/13/2016   Procedure: Right Transmetatarsal Amputation;  Surgeon: [name], MD;  Location: [place];  Service: Orthopedics;  Laterality: Right;  . AMPUTATION Right 04/15/2018   Procedure: AMPUTATION BELOW KNEE;  Surgeon: [name], MD;  Location: [place];  Service: Orthopedics;  Laterality: Right;  . BIOPSY  04/16/2018   Procedure: BIOPSY;  Surgeon: Irene Shipper, MD;  Location: [facility] ENDOSCOPY;  Service: Endoscopy;;  . CARDIAC CATHETERIZATION N/A 02/27/2015   Procedure: Left Heart Cath and Coronary Angiography;  Surgeon: [name], MD; LAD 40%, mCFX 80%, OM 70%, RCA 100% calcified       . CARDIAC CATHETERIZATION N/A 03/06/2015   Procedure: Coronary Stent Intervention;  Surgeon: [name], MD;  Location: [place] [facility];  Service: Cardiovascular;  Laterality: N/A;  Mid CX 3.50x12 promus DES w/ 0% resdual and Prox OM1 2.50x20 promus DES w/ 20% residual  . CARDIOVERSION    . CARDIOVERSION N/A 05/22/2018   Procedure: CARDIOVERSION;  Surgeon: Skeet Latch, MD;  Location: [place];  Service: Cardiovascular;  Laterality: N/A;  . CARPAL TUNNEL RELEASE Right Nov 2015  . CARPAL TUNNEL RELEASE Right 1992; 05/2014   [name]; [place]  . CESAREAN SECTION  1982; 1984  . ESOPHAGOGASTRODUODENOSCOPY (EGD) WITH PROPOFOL N/A 04/16/2018   Procedure: ESOPHAGOGASTRODUODENOSCOPY (EGD) WITH PROPOFOL;  Surgeon: Irene Shipper, MD;  [facility] END[facility]COPY;  Service: Endoscopy;  Laterality: N/A;  . FOOT NEUROMA SURGERY Bilateral 2000  . I&D EXTREMITY Left 01/06/2018   Procedure:  DEBRIDEMENT ULCER  LEFT FOOT;  Surgeon: [name], MD;  Location: [place];  Service: Orthopedics;  Laterality: Left;  . KNEE ARTHROSCOPY Right ~ 2003   "meniscus repair"  . LIPOMA EXCISION Right 2020   thigh  . SHOULDER OPEN ROTATOR CUFF REPAIR Right 1996; 1998   "w/fracture repair"  . THYROID SURGERY  2000   "removed lots of nodules"  . TONSILLECTOMY  1976    Social History   Tobacco Use  . Smoking status: Former Smoker    Packs/day: 0.00    Years: 41.00    Pack years: 0.00    Types: Cigarettes    Quit date: 03/05/2015    Years since quitting: 3.8  . Smokeless tobacco: Never Used  . Tobacco comment: 04/29/2015 "quit smoking cigarettes 02/27/2015"  Substance Use Topics  . Alcohol use: No    Family History  Problem Relation Age of Onset  . Cancer Mother 8       bronchial cancer  . Breast cancer Mother   . Lung cancer Mother   . Hypertension Father   . COPD Father   . Heart disease Father 63       CAD with cardiac stenting  . Heart attack Father   . Parkinson's disease Father   . Allergies Sister   . Breast cancer Maternal Grandmother   . Emphysema Maternal Grandfather   . Leukemia Paternal Grandmother   . Emphysema Paternal Grandfather   . Thyroid disease Neg Hx   . Colon cancer Neg Hx   . Stomach cancer Neg Hx   . Rectal cancer Neg Hx   . Esophageal cancer Neg Hx     Review of Systems  Constitutional: Negative for chills and fever.  Respiratory: Positive for shortness of breath and wheezing. Negative for cough.   Cardiovascular: Positive for PND. Negative for chest pain, palpitations, orthopnea and leg swelling.  Gastrointestinal: Negative for abdominal pain, nausea and vomiting.     OBJECTIVE:  Today's Vitals   01/22/19 1021  BP: 124/87  Pulse: 100  Temp: (!) 97.2 F (36.2 C)  SpO2: 97%  Weight: 178 lb (80.7 kg)  Height: 5\' 4"  (1.626 m)   Body mass index is 30.55 kg/m.  Wt Readings from Last 3 Encounters:  01/22/19 178 lb (80.7 kg)   01/19/19 172 lb (78 kg)  01/18/19 178 lb (80.7 kg)    Physical Exam Vitals signs and nursing note reviewed.  Constitutional:      Appearance: She is well-developed.  HENT:     Head: Normocephalic and atraumatic.     Mouth/Throat:     Pharynx: No oropharyngeal exudate.  Eyes:     General: No scleral icterus.    Conjunctiva/sclera: Conjunctivae normal.     Pupils: Pupils are equal, round, and reactive to light.  Neck:     Musculoskeletal: Neck supple.  Cardiovascular:     Rate and Rhythm: Normal rate and regular rhythm.     Heart sounds: Murmur present. No friction rub. No gallop.   Pulmonary:     Effort: Pulmonary effort is normal.     Breath sounds: Normal breath sounds. No decreased breath sounds, wheezing, rhonchi or rales.  Abdominal:     General: Bowel sounds are normal.     Palpations: Abdomen is soft. There is hepatomegaly. There is no splenomegaly or mass.     Tenderness: There is abdominal tenderness (RUQ). There is no guarding or rebound.  Musculoskeletal:     Left lower leg: No edema.  Comments: R BKA  Skin:    General: Skin is warm and dry.  Neurological:     Mental Status: She is alert and oriented to person, place, and time.       No results found for this or any previous visit (from the past 24 hour(s)).  No results found.   ASSESSMENT and PLAN  1. DOE (dyspnea on exertion) Unclear. No sign improvement with increase diuretic, weight gain has been steady since June 2020. Trial of ICS as albuterol provides relief. RUQ to look at hepatomegaly, ?cause of R pleural effusion. Anemia worse, r/o MM, most likely ACD, might need transfusion. Schedule cards appt as input still warranted.   2. Chronic diastolic congestive heart failure (HCC) - Basic Metabolic Panel  3. Anemia, unspecified type - Protein electrophoresis, serum - Iron, TIBC and Ferritin Panel  4. Hypomagnesemia - Magnesium  5. Hepatomegaly - US Abdomen Limited RUQ; Future  Other  orders - fluticasone (FLOVENT HFA) 44 MCG/ACT inhaler; Inhale 2 puffs into the lungs 2 (two) times daily.  Return in about 2 weeks (around 02/05/2019).    Rutherford Guys, MD Primary Care at [facility] [place], [place] [address] Ph.  [phone] Fax [phone]

## 2019-01-22 NOTE — Telephone Encounter (Signed)
Surgery cancelled due to SOB.

## 2019-01-22 NOTE — Telephone Encounter (Signed)
New Message    Patient states you wanted her to call after her visit with PCP about upcoming surgery, and Dr. Pamella Pert said no to the surgery.  Please call back to discuss.

## 2019-01-22 NOTE — Patient Instructions (Signed)
° ° ° °  If you have lab work done today you will be contacted with your lab results within the next 2 weeks.  If you have not heard from us then please contact us. The fastest way to get your results is to register for My Chart. ° ° °IF you received an x-ray today, you will receive an invoice from [facility]. Please contact  Radiology at [phone] with questions or concerns regarding your invoice.  ° °IF you received labwork today, you will receive an invoice from [facility]. Please contact [facility] at [phone] with questions or concerns regarding your invoice.  ° °Our billing staff will not be able to assist you with questions regarding bills from these companies. ° °You will be contacted with the lab results as soon as they are available. The fastest way to get your results is to activate your My Chart account. Instructions are located on the last page of this paperwork. If you have not heard from us regarding the results in 2 weeks, please contact this office. °  ° ° ° °

## 2019-01-23 ENCOUNTER — Telehealth: Payer: Self-pay

## 2019-01-23 LAB — BASIC METABOLIC PANEL
BUN/Creatinine Ratio: 22 (ref 12–28)
BUN: 26 mg/dL (ref 8–27)
CO2: 20 mmol/L (ref 20–29)
Calcium: 9.6 mg/dL (ref 8.7–10.3)
Chloride: 101 mmol/L (ref 96–106)
Creatinine, Ser: 1.19 mg/dL — ABNORMAL HIGH (ref 0.57–1.00)
GFR calc Af Amer: 55 mL/min/{1.73_m2} — ABNORMAL LOW (ref >59)
GFR calc non Af Amer: 48 mL/min/{1.73_m2} — ABNORMAL LOW (ref >59)
Glucose: 145 mg/dL — ABNORMAL HIGH (ref 65–99)
Potassium: 4.7 mmol/L (ref 3.5–5.2)
Sodium: 135 mmol/L (ref 134–144)

## 2019-01-23 LAB — IRON,TIBC AND FERRITIN PANEL
Ferritin: 60 ng/mL (ref 15–150)
Iron Saturation: 16 % (ref 15–55)
Iron: 51 ug/dL (ref 27–139)
Total Iron Binding Capacity: 322 ug/dL (ref 250–450)
UIBC: 271 ug/dL (ref 118–369)

## 2019-01-23 LAB — PROTEIN ELECTROPHORESIS, SERUM
A/G Ratio: 1.2 (ref 0.7–1.7)
Albumin ELP: 3.5 g/dL (ref 2.9–4.4)
Alpha 1: 0.2 g/dL (ref 0.0–0.4)
Alpha 2: 1 g/dL (ref 0.4–1.0)
Beta: 1 g/dL (ref 0.7–1.3)
Gamma Globulin: 0.8 g/dL (ref 0.4–1.8)
Globulin, Total: 3 g/dL (ref 2.2–3.9)
Total Protein: 6.5 g/dL (ref 6.0–8.5)

## 2019-01-23 LAB — MAGNESIUM: Magnesium: 1.9 mg/dL (ref 1.6–2.3)

## 2019-01-23 NOTE — Telephone Encounter (Signed)
  Follow up Call-  Call back number 01/19/2019 11/05/2016  Post procedure Call Back phone  # [phone]  Permission to leave phone message Yes Yes  Some recent data might be hidden     Patient questions:  Do you have a fever, pain , or abdominal swelling? No. Pain Score  0 *  Have you tolerated food without any problems? Yes.    Have you been able to return to your normal activities? Yes.    Do you have any questions about your discharge instructions: Diet   No. Medications  No. Follow up visit  No.  Do you have questions or concerns about your Care? No.  Actions: * If pain score is 4 or above: No action needed, pain <4.  1. Have you developed a fever since your procedure? no  2.   Have you had an respiratory symptoms (SOB or cough) since your procedure? no  3.   Have you tested positive for COVID 19 since your procedure no  4.   Have you had any family members/close contacts diagnosed with the COVID 19 since your procedure?  no   If yes to any of these questions please route to Joylene John, RN and Alphonsa Gin, Therapist, sports.

## 2019-01-24 ENCOUNTER — Ambulatory Visit (HOSPITAL_COMMUNITY): Admission: RE | Admit: 2019-01-24 | Payer: Medicare PPO | Source: Home / Self Care | Admitting: Orthopedic Surgery

## 2019-01-24 SURGERY — ARTHROSCOPY, SHOULDER
Anesthesia: General | Site: Shoulder | Laterality: Right

## 2019-01-25 DIAGNOSIS — G4733 Obstructive sleep apnea (adult) (pediatric): Secondary | ICD-10-CM | POA: Diagnosis not present

## 2019-01-29 ENCOUNTER — Other Ambulatory Visit: Payer: Self-pay | Admitting: *Deleted

## 2019-01-29 ENCOUNTER — Telehealth: Payer: Self-pay | Admitting: *Deleted

## 2019-01-29 MED ORDER — CHOLESTYRAMINE 4 G PO PACK: 2.0000 g | PACK | Freq: Every day | ORAL | 0 refills | Status: DC

## 2019-01-29 NOTE — Telephone Encounter (Signed)
She will need to take cholestyramine at least 3 to 4 hours before or after amiodarone and avoid taking both at the same time so that cholestyramine does not lower effect of amiodarone.  Thanks

## 2019-01-29 NOTE — Telephone Encounter (Signed)
High drug-drug interaction between Cholestyramine and Amiodarone. Please advise.

## 2019-01-29 NOTE — Telephone Encounter (Signed)
Cholestyramine script sent to pharmacy. Patient called and made aware of how to space of amiodarone and cholestyramine.

## 2019-01-31 DIAGNOSIS — E113392 Type 2 diabetes mellitus with moderate nonproliferative diabetic retinopathy without macular edema, left eye: Secondary | ICD-10-CM | POA: Diagnosis not present

## 2019-01-31 DIAGNOSIS — H35372 Puckering of macula, left eye: Secondary | ICD-10-CM | POA: Diagnosis not present

## 2019-01-31 DIAGNOSIS — E113311 Type 2 diabetes mellitus with moderate nonproliferative diabetic retinopathy with macular edema, right eye: Secondary | ICD-10-CM | POA: Diagnosis not present

## 2019-01-31 LAB — HM DIABETES EYE EXAM

## 2019-02-01 ENCOUNTER — Ambulatory Visit
Admission: RE | Admit: 2019-02-01 | Discharge: 2019-02-01 | Disposition: A | Discharge status: Home / Self Care | Payer: Medicare PPO | Source: Ambulatory Visit | Attending: Family Medicine | Admitting: Family Medicine

## 2019-02-01 DIAGNOSIS — R160 Hepatomegaly, not elsewhere classified: Secondary | ICD-10-CM

## 2019-02-01 DIAGNOSIS — K8020 Calculus of gallbladder without cholecystitis without obstruction: Secondary | ICD-10-CM | POA: Diagnosis not present

## 2019-02-05 ENCOUNTER — Other Ambulatory Visit: Payer: Self-pay

## 2019-02-05 ENCOUNTER — Encounter: Payer: Self-pay | Admitting: Family Medicine

## 2019-02-05 ENCOUNTER — Ambulatory Visit (INDEPENDENT_AMBULATORY_CARE_PROVIDER_SITE_OTHER): Payer: Medicare PPO | Admitting: Family Medicine

## 2019-02-05 VITALS — BP 92/60 | HR 95 | Temp 98.1°F | Ht 63.0 in | Wt 177.8 lb

## 2019-02-05 DIAGNOSIS — R0609 Other forms of dyspnea: Secondary | ICD-10-CM

## 2019-02-05 MED ORDER — TRIAMCINOLONE ACETONIDE 0.1 % MT PSTE: 1.0000 "application " | PASTE | Freq: Two times a day (BID) | OROMUCOSAL | 0 refills | Status: AC | PRN

## 2019-02-05 NOTE — Progress Notes (Signed)
9/28/202010:07 AM  Judith [name] [birth year], 68 y.o., female [id number]  Chief Complaint  Patient presents with  . medical conditions    2 week f/u   . Shortness of Breath    1 month     HPI:   Patient is a 68 y.o. female with past medical history significant for DM2,afib,CAD,peripheral neuropathy, and right BKA,hypomagenesemia, anemia,L thalamic ICH, OSA on bipap, asthmawho presents today forfollowup DOE  Last OV 2 weeks ago Started ICS - did not help Labs and [place] reviewed Still having DOE with minimal activity She stopped taking lasix/kcl as not helping x 1 week She reports GERD well controlled with carafate Sees cards on oct 5th Has postponed right shoulder surgery, otc diclofenac helps, until DOE etiology addressed  Lab Results  Component Value Date   WBC 6.4 01/18/2019   HGB 9.5 (L) 01/18/2019   HCT 30.2 (L) 01/18/2019   MCV 91 01/18/2019   PLT 377 01/18/2019   Lab Results  Component Value Date   CREATININE 1.19 (H) 01/22/2019   BUN 26 01/22/2019   NA 135 01/22/2019   K 4.7 01/22/2019   CL 101 01/22/2019   CO2 20 01/22/2019   Lab Results  Component Value Date   ALT 15 01/18/2019   AST 18 01/18/2019   ALKPHOS 120 (H) 01/18/2019   BILITOT 0.3 01/18/2019   Lab Results  Component Value Date   TSH 0.474 10/18/2018   RUQ [place]: IMPRESSION: Cholelithiasis.  Otherwise normal exam.  CXR: IMPRESSION: Findings suggesting minimal vascular congestion with small right pleural effusion. Borderline cardiomegaly.  Depression screen [facility] 2/9 02/05/2019 01/22/2019 01/18/2019  Decreased Interest 1 0 0  Down, Depressed, Hopeless 1 0 0  PHQ - 2 Score 2 0 0  Altered sleeping 2 - -  Tired, decreased energy 1 - -  Change in appetite 0 - -  Feeling bad or failure about yourself  0 - -  Trouble concentrating 0 - -  Moving slowly or fidgety/restless 0 - -  Suicidal thoughts 0 - -  PHQ-9 Score 5 - -  Some recent data might be hidden    Fall Risk  02/05/2019  01/22/2019 01/18/2019 01/18/2019 11/17/2018  Falls in the past year? 0 0 1 0 0  Number falls in past yr: 0 0 0 0 0  Injury with Fall? 0 0 0 0 0  Comment - - - - -  Risk Factor Category  - - - - -  Risk for fall due to : - - - Impaired mobility;Impaired balance/gait -  Risk for fall due to: Comment - - - - -  Follow up Falls evaluation completed - - - -     Allergies  Allergen Reactions  . Contrast Media [Iodinated Diagnostic Agents] Hives and Other (See Comments)    Spoke to patient, Iodine allergy is really IV contrast allergy.   [name] [Insulin Aspart] Shortness Of Breath and Other (See Comments)    "breathing problems"  . Codeine Nausea And Vomiting and Other (See Comments)    HIGH DOSES-SEVERE VOMITING  . Iodine Other (See Comments)    MUST HAVE BENADRYL PRIOR TO PROCEDURE AND RIGHT BEFORE TREATMENT TO COUNTERACT REACTION-BLISTERING REACTION DERMATOLOGICAL  . Penicillins Itching, Rash and Other (See Comments)    Has patient had a PCN reaction causing immediate rash, facial/tongue/throat swelling, SOB or lightheadedness with hypotension: no Has patient had a PCN reaction causing severe rash involving mucus membranes or skin necrosis: No Has patient had a  PCN reaction that required hospitalization No Has patient had a PCN reaction occurring within the last 10 years: No If all of the above answers are "NO", then may proceed with Cephalosporin use.  CHEST SIZED RASH AND ITCHING   . Ace Inhibitors Cough  . Demerol [Meperidine] Nausea And Vomiting  . Dilaudid [Hydromorphone Hcl] Other (See Comments)    HEADACHE   . Neosporin [Neomycin-Bacitracin Zn-Polymyx] Itching, Rash and Other (See Comments)    MAKES REACTIONS WORSE WHEN USING AS PROPHYLACTIC  . Percocet [Oxycodone-Acetaminophen] Rash  . Tape Itching and Rash    Prior to Admission medications   Medication Sig Start Date End Date Taking? Authorizing Provider  albuterol (PROVENTIL HFA) 108 (90 Base) MCG/ACT inhaler Inhale 1-2  puffs into the lungs every 6 (six) hours as needed for wheezing or shortness of breath. 05/01/18  Yes Angiulli, Lavon Paganini, PA-C  amiodarone (PACERONE) 200 MG tablet Take 1 tablet (200 mg total) by mouth daily. Patient taking differently: Take 200 mg by mouth See admin instructions. Take 1 tablet (200 mg) by mouth scheduled every morning, may repeat dose if needed for afib 06/08/18  Yes Sherran Needs, NP  atorvastatin (LIPITOR) 20 MG tablet Take 1 tablet (20 mg total) by mouth daily. 05/01/18  Yes Angiulli, Lavon Paganini, PA-C  buPROPion (WELLBUTRIN) 75 MG tablet Take 75 mg by mouth 2 (two) times daily.   Yes [provider]  cetirizine (ZYRTEC) 10 MG tablet Take 10 mg by mouth daily.   Yes [provider]  cholestyramine (QUESTRAN) 4 g packet Take 0.5 packets (2 g total) by mouth at bedtime. 01/29/19  Yes Nandigam, Venia Minks, MD  diltiazem (CARTIA XT) 120 MG 24 hr capsule Take 120 mg by mouth daily.   Yes [provider]  ELIQUIS 5 MG TABS tablet Take 1 tablet by mouth twice daily Patient taking differently: Take 5 mg by mouth 2 (two) times daily.  11/21/18  Yes Turner, Eber Hong, MD  famotidine (PEPCID) 20 MG tablet Take 1 tablet (20 mg total) by mouth 2 (two) times daily. 06/17/18  Yes Rutherford Guys, MD  FLUoxetine (PROZAC) 20 MG capsule TAKE 1 CAPSULE AT BEDTIME Patient taking differently: Take 20 mg by mouth at bedtime.  01/03/19  Yes Rutherford Guys, MD  fluticasone (FLOVENT HFA) 44 MCG/ACT inhaler Inhale 2 puffs into the lungs 2 (two) times daily. 01/22/19  Yes Rutherford Guys, MD  furosemide (LASIX) 40 MG tablet Take 1 tablet (40 mg total) by mouth daily. 01/18/19  Yes Rutherford Guys, MD  hydrALAZINE (APRESOLINE) 50 MG tablet Take 1 tablet (50 mg total) by mouth 3 (three) times daily. 10/13/18  Yes Isaiah Serge, NP  insulin degludec (TRESIBA FLEXTOUCH) 100 UNIT/ML SOPN FlexTouch Pen Inject 20 Units into the skin at bedtime.    Yes [provider]  loratadine  (CLARITIN) 10 MG tablet Take 10 mg by mouth at bedtime.   Yes [provider]  magnesium oxide (MAG-OX) 400 MG tablet Take 400 mg by mouth at bedtime.    Yes [provider]  Melatonin 10 MG TABS Take 10 mg by mouth at bedtime.    Yes [provider]  metFORMIN (GLUCOPHAGE-XR) 500 MG 24 hr tablet Take 1,000 mg by mouth daily with breakfast.   Yes [provider]  methimazole (TAPAZOLE) 10 MG tablet TAKE 1 TABLET (10 MG TOTAL) BY MOUTH DAILY. 12/10/18  Yes Renato Shin, MD  metoprolol succinate (TOPROL-XL) 25 MG 24 hr tablet  Take 25 mg by mouth 2 (two) times daily.   Yes [provider]  mupirocin ointment (BACTROBAN) 2 % Apply 1 application topically 2 (two) times daily as needed (wound/sores).    Yes [provider]  nitroGLYCERIN (NITROSTAT) 0.4 MG SL tablet Place 0.4 mg under the tongue every 5 (five) minutes x 3 doses as needed for chest pain.    Yes [provider]  polyethylene glycol powder (GLYCOLAX/MIRALAX) 17 GM/SCOOP powder Take 17 g by mouth daily as needed (constipation.).    Yes [provider]  potassium chloride (K-DUR) 10 MEQ tablet Take 1 tablet (10 mEq total) by mouth 2 (two) times daily. 01/18/19  Yes Rutherford Guys, MD  Respiratory Therapy Supplies DEVI BI-PAP @@ Bedtime   Yes [provider]  sucralfate (CARAFATE) 1 g tablet Take 1 tablet (1 g total) by mouth 4 (four) times daily -  with meals and at bedtime. 01/08/19  Yes Nandigam, Venia Minks, MD  triamcinolone (KENALOG) 0.1 % paste Use as directed 1 application in the mouth or throat 2 (two) times daily as needed (ulcer lesion.).  10/18/18  Yes [provider]  TRUE METRIX BLOOD GLUCOSE TEST test strip TEST BLOOD SUGAR TWICE DAILY 09/16/18  Yes Renato Shin, MD    Past Medical History:  Diagnosis Date  . Abnormal EKG 07/31/2013  . Arthritis    "hands" (03/06/2015)  . Asthma   . Carpal tunnel syndrome, bilateral   . Chronic kidney  disease   . Complication of anesthesia    slow to wake up. Pt sts she woke up during surgery many years ago.  . Coronary artery disease     2 v CAD with CTO of the RCA and high grade bifurcational LCx/OM stenosis. S/P PCI DES x 2 to the LCx/OM.  Marland Kitchen Diabetic peripheral neuropathy ([place]) "since 1996"  . GERD (gastroesophageal reflux disease)   . Goiter   . Headache    migraines prior to menopause  . History of shingles 06/01/2013  . Hyperlipidemia LDL goal <70 10/13/2015  . Hypertension   . Hyperthyroidism   . Osteomyelitis of foot (HCC)    Right  . PAF (paroxysmal atrial fibrillation) ([place]) 04/29/2015   CHADS2VASC score of 5 now on Apixaban  . Pneumonia ~ 1976  . Sleep apnea    Bipap  . Tremors of nervous system   . Type II diabetes mellitus (HCC)    insulin dependent    Past Surgical History:  Procedure Laterality Date  . ABDOMINAL HYSTERECTOMY  1988   age 19; CERVICAL DYSPLASIA; ovaries intact.   . AMPUTATION Right 01/23/2016   Procedure: Right 3rd Ray Amputation;  Surgeon: [name], MD;  Location: [facility];  Service: Orthopedics;  Laterality: Right;  . AMPUTATION Right 02/13/2016   Procedure: Right Transmetatarsal Amputation;  Surgeon: [name], MD;  Location: [place];  Service: Orthopedics;  Laterality: Right;  . AMPUTATION Right 04/15/2018   Procedure: AMPUTATION BELOW KNEE;  Surgeon: [name], MD;  Location: [place];  Service: Orthopedics;  Laterality: Right;  . BIOPSY  04/16/2018   Procedure: BIOPSY;  Surgeon: Irene Shipper, MD;  [facility] END[facility]COPY;  Service: Endoscopy;;  . CARDIAC CATHETERIZATION N/A 02/27/2015   Procedure: Left Heart Cath and Coronary Angiography;  Surgeon: [name], MD; LAD 40%, mCFX 80%, OM 70%, RCA 100% calcified       . CARDIAC CATHETERIZATION N/A 03/06/2015   Procedure: Coronary Stent Intervention;  Surgeon: [name], MD;  Location: [facility];  Service: Cardiovascular;  Laterality: N/A;  Mid CX 3.50x12 promus DES w/ 0%  resdual and Prox OM1 2.50x20 promus DES w/ 20% residual  . CARDIOVERSION    . CARDIOVERSION N/A 05/22/2018   Procedure: CARDIOVERSION;  Surgeon: Skeet Latch, MD;  Location: [place];  Service: Cardiovascular;  Laterality: N/A;  . CARPAL TUNNEL RELEASE Right Nov 2015  . CARPAL TUNNEL RELEASE Right 1992; 05/2014   [name]; [place]  . CESAREAN SECTION  1982; 1984  . ESOPHAGOGASTRODUODENOSCOPY (EGD) WITH PROPOFOL N/A 04/16/2018   Procedure: ESOPHAGOGASTRODUODENOSCOPY (EGD) WITH PROPOFOL;  Surgeon: Irene Shipper, MD;  Location[facility] ENDOSCOPY;  Service: Endoscopy;  Laterality: N/A;  . FOOT NEUROMA SURGERY Bilateral 2000  . I&D EXTREMITY Left 01/06/2018   Procedure: DEBRIDEMENT ULCER LEFT FOOT;  Surgeon: [name], MD;  Location: [place] [facility];  Service: Orthopedics;  Laterality: Left;  . KNEE ARTHROSCOPY Right ~ 2003   "meniscus repair"  . LIPOMA EXCISION Right 2020   thigh  . SHOULDER OPEN ROTATOR CUFF REPAIR Right 1996; 1998   "w/fracture repair"  . THYROID SURGERY  2000   "removed lots of nodules"  . TONSILLECTOMY  1976    Social History   Tobacco Use  . Smoking status: Former Smoker    Packs/day: 0.00    Years: 41.00    Pack years: 0.00    Types: Cigarettes    Quit date: 03/05/2015    Years since quitting: 3.9  . Smokeless tobacco: Never Used  . Tobacco comment: 04/29/2015 "quit smoking cigarettes 02/27/2015"  Substance Use Topics  . Alcohol use: No    Family History  Problem Relation Age of Onset  . Cancer Mother 54       bronchial cancer  . Breast cancer Mother   . Lung cancer Mother   . Hypertension Father   . COPD Father   . Heart disease Father 71       CAD with cardiac stenting  . Heart attack Father   . Parkinson's disease Father   . Allergies Sister   . Breast cancer Maternal Grandmother   . Emphysema Maternal Grandfather   . Leukemia Paternal Grandmother   . Emphysema Paternal Grandfather   . Thyroid disease Neg Hx   . Colon cancer Neg Hx   .  Stomach cancer Neg Hx   . Rectal cancer Neg Hx   . Esophageal cancer Neg Hx     Review of Systems  Constitutional: Negative for chills and fever.  Respiratory: Positive for shortness of breath. Negative for cough.   Cardiovascular: Negative for chest pain, palpitations and leg swelling.  Gastrointestinal: Negative for abdominal pain, nausea and vomiting.     OBJECTIVE:  Today's Vitals   02/05/19 0920  BP: 92/60  Pulse: 95  Temp: 98.1 F (36.7 C)  TempSrc: Oral  SpO2: 97%  Weight: 177 lb 12.8 oz (80.6 kg)  Height: 5\' 3"  (1.6 m)   Body mass index is 31.5 kg/m.  Wt Readings from Last 3 Encounters:  02/05/19 177 lb 12.8 oz (80.6 kg)  01/22/19 178 lb (80.7 kg)  01/19/19 172 lb (78 kg)    Physical Exam Vitals signs and nursing note reviewed.  Constitutional:      Appearance: She is well-developed.  HENT:     Head: Normocephalic and atraumatic.     Mouth/Throat:     Pharynx: No oropharyngeal exudate.  Eyes:     General: No scleral icterus.    Conjunctiva/sclera: Conjunctivae  normal.     Pupils: Pupils are equal, round, and reactive to light.  Neck:     Musculoskeletal: Neck supple.     Vascular: No JVD.  Cardiovascular:     Rate and Rhythm: Normal rate and regular rhythm.     Heart sounds: Normal heart sounds. No murmur. No friction rub. No gallop.   Pulmonary:     Effort: Pulmonary effort is normal.     Breath sounds: Normal breath sounds. No wheezing, rhonchi or rales.  Musculoskeletal:     Left lower leg: No edema.  Skin:    General: Skin is warm and dry.  Neurological:     Mental Status: She is alert and oriented to person, place, and time.     No results found for this or any previous visit (from the past 24 hour(s)).  No results found.   ASSESSMENT and PLAN  1. DOE (dyspnea on exertion) Unclear etiology. CXR suggestive of CHF, no improvement with increase diuresis, original cards visit did not think cardiac. Has upcoming fu. On amiodarone, cxr  reassuring. Has h/o asthma, ICS did not help, ? Correct dose? Referring to pulm. Hgb 9,5 - ACD, mild renal insufficiency, asking cards to eval hgb goal in CAD patient. Deconditioning less likely, had been doing well with new prosthesis for months prior. PE less likely - on eliquis, no risk factors. Normal LFTs and liver.  - Ambulatory referral to Pulmonology  Other orders - triamcinolone (KENALOG) 0.1 % paste; Use as directed 1 application in the mouth or throat 2 (two) times daily as needed (ulcer lesion.).  Return for after seeing pulm.    Rutherford Guys, MD Primary Care at [facility] [place], [place] [address] Ph.  [phone] Fax [phone]

## 2019-02-05 NOTE — Patient Instructions (Signed)
° ° ° °  If you have lab work done today you will be contacted with your lab results within the next 2 weeks.  If you have not heard from us then please contact us. The fastest way to get your results is to register for My Chart. ° ° °IF you received an x-ray today, you will receive an invoice from [facility]. Please contact [facility] at [phone] with questions or concerns regarding your invoice.  ° °IF you received labwork today, you will receive an invoice from [facility]. Please contact [facility] at [phone] with questions or concerns regarding your invoice.  ° °Our billing staff will not be able to assist you with questions regarding bills from these companies. ° °You will be contacted with the lab results as soon as they are available. The fastest way to get your results is to activate your My Chart account. Instructions are located on the last page of this paperwork. If you have not heard from us regarding the results in 2 weeks, please contact this office. °  ° ° ° °

## 2019-02-06 ENCOUNTER — Encounter: Payer: Self-pay | Admitting: Podiatry

## 2019-02-06 ENCOUNTER — Ambulatory Visit (INDEPENDENT_AMBULATORY_CARE_PROVIDER_SITE_OTHER): Payer: Medicare PPO | Admitting: Podiatry

## 2019-02-06 DIAGNOSIS — B351 Tinea unguium: Secondary | ICD-10-CM

## 2019-02-06 DIAGNOSIS — M79674 Pain in right toe(s): Secondary | ICD-10-CM | POA: Diagnosis not present

## 2019-02-06 DIAGNOSIS — L84 Corns and callosities: Secondary | ICD-10-CM

## 2019-02-06 DIAGNOSIS — E1149 Type 2 diabetes mellitus with other diabetic neurological complication: Secondary | ICD-10-CM

## 2019-02-06 DIAGNOSIS — M79675 Pain in left toe(s): Secondary | ICD-10-CM

## 2019-02-06 NOTE — Progress Notes (Signed)
Subjective: 68 y.o. returns the office today for painful, elongated, thickened toenails which she cannot trim herself on the left foot. She has no new concerns except she still asking for diabetic shoes. Denies any redness or drainage around the nails. Denies any acute changes to her lower extremities since last appointment and no new complaints today. Denies any systemic complaints such as fevers, chills, nausea, vomiting.   PCP: Rutherford Guys, MD  Objective: AAO 3, NAD Sensation decreased with SWMF Right BKA Nails hypertrophic, dystrophic, elongated, brittle, discolored 5. There is tenderness overlying the nails 1-5 on the left. There is no surrounding erythema or drainage along the nail sites. Small callus lateral 5th MTPJ. No open wound or signs of infection. No swelling No open lesions or pre-ulcerative lesions are identified. Hammertoes present.  No other areas of tenderness. No overlying edema, erythema, increased warmth. No pain with calf compression, swelling, warmth, erythema.  Assessment: Patient presents with symptomatic onychomycosis  Plan: -Treatment options including alternatives, risks, complications were discussed -Nails sharply debrided 5 without complication/bleeding. Debrided the small callus to the 5th MTPJ without complications/bleeding. No signs of infection.  -Discussed daily foot inspection. If there are any changes, to call the office immediately.  -Will try for diabetic shoes. Given history of BKA, callus, neuropathy I do think she will benefit from them.  -Follow-up in 3 months or sooner if any problems are to arise. In the meantime, encouraged to call the office with any questions, concerns, changes symptoms.  Celesta Gentile, DPM

## 2019-02-09 ENCOUNTER — Telehealth: Payer: Self-pay

## 2019-02-09 NOTE — Progress Notes (Signed)
Cardiology Office Note    Date:  02/12/2019   ID:  Judith [name], Judith [name] 04-Mar-1951, MRN [id number]  PCP:  Rutherford Guys, MD  Cardiologist:  Dr. Radford Pax EP: Dr. Rayann Heman   Chief Complaint: SOB  History of Present Illness:   Judith [name] is a 68 y.o. 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   Long hx of recurrent atrial fibrillation. Failed sotolol. Successful cardioversion 1/13 but with ERAF. Currently on Amiodarone. Hospitalized in February at [facility] for DKA and incidental L thalamic ICH. Maintained on Eliquis. Low risk stress test 10/2018.  Seen by me 01/08/2019 for orthopnea and PND which was resolved with PRN lasix (given by PCP). Recommended low sodium diet. She was cleared for colonoscopy. Her R shoulder surgery cancelled due to ongoing SOB.   Here today for further evaluation. No improvement on steroid inhaler or daily lasix. He has persistent SOB. Denies going out of rhythm or palpitations. She is compliant with BiPAP but reports wakening up suddenly.  Reassuring lab work 2 weeks ago - reviewed personally. No chest pain, LE edema or syncope.   Past Medical History:  Diagnosis Date  . Abnormal EKG 07/31/2013  . Arthritis    "hands" (03/06/2015)  . Asthma   . Carpal tunnel syndrome, bilateral   . Chronic kidney disease   . Complication of anesthesia    slow to wake up. Pt sts she woke up during surgery many years ago.  . Coronary artery disease     2 v CAD with CTO of the RCA and high grade bifurcational LCx/OM stenosis. S/P PCI DES x 2 to the LCx/OM.  Marland Kitchen Diabetic peripheral neuropathy ([place]) "since 1996"  . GERD (gastroesophageal reflux disease)   . Goiter   . Headache    migraines prior to menopause  . History of shingles 06/01/2013  .  Hyperlipidemia LDL goal <70 10/13/2015  . Hypertension   . Hyperthyroidism   . Osteomyelitis of foot (HCC)    Right  . PAF (paroxysmal atrial fibrillation) ([place]) 04/29/2015   CHADS2VASC score of 5 now on Apixaban  . Pneumonia ~ 1976  . Sleep apnea    Bipap  . Tremors of nervous system   . Type II diabetes mellitus (HCC)    insulin dependent    Past Surgical History:  Procedure Laterality Date  . ABDOMINAL HYSTERECTOMY  1988   age 71; CERVICAL DYSPLASIA; ovaries intact.   . AMPUTATION Right 01/23/2016   Procedure: Right 3rd Ray Amputation;  Surgeon: [name], MD;  Location: [place];  Service: Orthopedics;  Laterality: Right;  . AMPUTATION Right 02/13/2016   Procedure: Right Transmetatarsal Amputation;  Surgeon: [name], MD;  Location: [facility];  Service: Orthopedics;  Laterality: Right;  . AMPUTATION Right 04/15/2018   Procedure: AMPUTATION BELOW KNEE;  Surgeon: [name], MD;  Location: [place];  Service: Orthopedics;  Laterality: Right;  . BIOPSY  04/16/2018   Procedure: BIOPSY;  Surgeon: Irene Shipper, MD;  Location: [facility] ENDOSCOPY;  Service: Endoscopy;;  . CARDIAC CATHETERIZATION N/A 02/27/2015   Procedure: Left Heart Cath and Coronary Angiography;  Surgeon: [name], MD; LAD 40%, mCFX 80%, OM 70%, RCA 100% calcified       . CARDIAC CATHETERIZATION N/A 03/06/2015   Procedure:  Coronary Stent Intervention;  Surgeon: [name], MD;  Location: [place] CV [facility];  Service: Cardiovascular;  Laterality: N/A;  Mid CX 3.50x12 promus DES w/ 0% resdual and Prox OM1 2.50x20 promus DES w/ 20% residual  . CARDIOVERSION    . CARDIOVERSION N/A 05/22/2018   Procedure: CARDIOVERSION;  Surgeon: Skeet Latch, MD;  Location: [place];  Service: Cardiovascular;  Laterality: N/A;  . CARPAL TUNNEL RELEASE Right Nov 2015  . CARPAL TUNNEL RELEASE Right 1992; 05/2014   [name]; [place]  . CESAREAN SECTION  1982; 1984  . ESOPHAGOGASTRODUODENOSCOPY (EGD) WITH PROPOFOL N/A 04/16/2018    Procedure: ESOPHAGOGASTRODUODENOSCOPY (EGD) WITH PROPOFOL;  Surgeon: Irene Shipper, MD;  Location: [facility] ENDOSCOPY;  Service: Endoscopy;  Laterality: N/A;  . FOOT NEUROMA SURGERY Bilateral 2000  . I&D EXTREMITY Left 01/06/2018   Procedure: DEBRIDEMENT ULCER LEFT FOOT;  Surgeon: [name], MD;  Location: [place];  Service: Orthopedics;  Laterality: Left;  . KNEE ARTHROSCOPY Right ~ 2003   "meniscus repair"  . LIPOMA EXCISION Right 2020   thigh  . SHOULDER OPEN ROTATOR CUFF REPAIR Right 1996; 1998   "w/fracture repair"  . THYROID SURGERY  2000   "removed lots of nodules"  . TONSILLECTOMY  1976    Current Medications: Prior to Admission medications   Medication Sig Start Date End Date Taking? Authorizing Provider  albuterol (PROVENTIL HFA) 108 (90 Base) MCG/ACT inhaler Inhale 1-2 puffs into the lungs every 6 (six) hours as needed for wheezing or shortness of breath. 05/01/18   Angiulli, Lavon Paganini, PA-C  amiodarone (PACERONE) 200 MG tablet Take 1 tablet (200 mg total) by mouth daily. Patient taking differently: Take 200 mg by mouth See admin instructions. Take 1 tablet (200 mg) by mouth scheduled every morning, may repeat dose if needed for afib 06/08/18   Sherran Needs, NP  atorvastatin (LIPITOR) 20 MG tablet Take 1 tablet (20 mg total) by mouth daily. 05/01/18   Angiulli, Lavon Paganini, PA-C  buPROPion (WELLBUTRIN) 75 MG tablet Take 75 mg by mouth 2 (two) times daily.    [provider]  cetirizine (ZYRTEC) 10 MG tablet Take 10 mg by mouth daily.    [provider]  cholestyramine (QUESTRAN) 4 g packet Take 0.5 packets (2 g total) by mouth at bedtime. 01/29/19   Mauri Pole, MD  diltiazem (CARTIA XT) 120 MG 24 hr capsule Take 120 mg by mouth daily.    [provider]  ELIQUIS 5 MG TABS tablet Take 1 tablet by mouth twice daily Patient taking differently: Take 5 mg by mouth 2 (two) times daily.  11/21/18   Sueanne Margarita, MD  famotidine (PEPCID) 20 MG tablet  Take 1 tablet (20 mg total) by mouth 2 (two) times daily. 06/17/18   Rutherford Guys, MD  FLUoxetine (PROZAC) 20 MG capsule TAKE 1 CAPSULE AT BEDTIME Patient taking differently: Take 20 mg by mouth at bedtime.  01/03/19   Rutherford Guys, MD  fluticasone (FLOVENT HFA) 44 MCG/ACT inhaler Inhale 2 puffs into the lungs 2 (two) times daily. 01/22/19   Rutherford Guys, MD  furosemide (LASIX) 40 MG tablet Take 1 tablet (40 mg total) by mouth daily. 01/18/19   Rutherford Guys, MD  hydrALAZINE (APRESOLINE) 50 MG tablet Take 1 tablet (50 mg total) by mouth 3 (three) times daily. 10/13/18   Isaiah Serge, NP  insulin degludec (TRESIBA FLEXTOUCH) 100 UNIT/ML SOPN FlexTouch Pen Inject 20 Units into the skin at bedtime.  [provider]  loratadine (CLARITIN) 10 MG tablet Take 10 mg by mouth at bedtime.    [provider]  magnesium oxide (MAG-OX) 400 MG tablet Take 400 mg by mouth at bedtime.     [provider]  Melatonin 10 MG TABS Take 10 mg by mouth at bedtime.     [provider]  metFORMIN (GLUCOPHAGE-XR) 500 MG 24 hr tablet Take 1,000 mg by mouth daily with breakfast.    [provider]  methimazole (TAPAZOLE) 10 MG tablet TAKE 1 TABLET (10 MG TOTAL) BY MOUTH DAILY. 12/10/18   Renato Shin, MD  metoprolol succinate (TOPROL-XL) 25 MG 24 hr tablet Take 25 mg by mouth 2 (two) times daily.    [provider]  mupirocin ointment (BACTROBAN) 2 % Apply 1 application topically 2 (two) times daily as needed (wound/sores).     [provider]  nitroGLYCERIN (NITROSTAT) 0.4 MG SL tablet Place 0.4 mg under the tongue every 5 (five) minutes x 3 doses as needed for chest pain.     [provider]  polyethylene glycol powder (GLYCOLAX/MIRALAX) 17 GM/SCOOP powder Take 17 g by mouth daily as needed (constipation.).     [provider]  potassium chloride (K-DUR) 10 MEQ tablet Take 1 tablet (10 mEq total) by mouth 2 (two) times daily. 01/18/19    Rutherford Guys, MD  Respiratory Therapy Supplies DEVI BI-PAP [phone] Bedtime    [provider]  sucralfate (CARAFATE) 1 g tablet Take 1 tablet (1 g total) by mouth 4 (four) times daily -  with meals and at bedtime. 01/08/19   Mauri Pole, MD  triamcinolone (KENALOG) 0.1 % paste Use as directed 1 application in the mouth or throat 2 (two) times daily as needed (ulcer lesion.). 02/05/19   Rutherford Guys, MD  TRUE METRIX BLOOD GLUCOSE TEST test strip TEST BLOOD SUGAR TWICE DAILY 09/16/18   Renato Shin, MD    Allergies:   Contrast media [iodinated diagnostic agents], Novolog [insulin aspart], Codeine, Iodine, Penicillins, Ace inhibitors, Demerol [meperidine], Dilaudid [hydromorphone hcl], Neosporin [neomycin-bacitracin zn-polymyx], Percocet [oxycodone-acetaminophen], and Tape   Social History   Socioeconomic History  . Marital status: Single    Spouse name: Not on file  . Number of children: 2  . Years of education: Masters  . Highest education level: Not on file  Occupational History  . Occupation: Landscape architect  Social Needs  . Financial resource strain: Somewhat hard  . Food insecurity    Worry: Sometimes true    Inability: Sometimes true  . Transportation needs    Medical: No    Non-medical: No  Tobacco Use  . Smoking status: Former Smoker    Packs/day: 0.00    Years: 41.00    Pack years: 0.00    Types: Cigarettes    Quit date: 03/05/2015    Years since quitting: 3.9  . Smokeless tobacco: Never Used  . Tobacco comment: 04/29/2015 "quit smoking cigarettes 02/27/2015"  Substance and Sexual Activity  . Alcohol use: No  . Drug use: No  . Sexual activity: Not Currently    Birth control/protection: Post-menopausal, Surgical  Lifestyle  . Physical activity    Days per week: 0 days    Minutes per session: 0 min  . Stress: Very much  Relationships  . Social connections    Talks on phone: More than three times a week    Gets together: More than three  times a week    Attends  religious service: More than 4 times per year    Active member of club or organization: No    Attends meetings of clubs or organizations: Never    Relationship status: Never married  Other Topics Concern  . 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      Lives with sister in [place].      Tobacco: 1 ppd x 41 years - quit 2016      Alcohol: none      Drugs: none      Exercise:  Walking in neighborhood; physical job.   Right-handed.   2 cups caffeine daily.     Family History:  The patient's family history includes Allergies in her sister; Breast cancer in her maternal grandmother and mother; COPD in her father; Cancer (age of onset: 81) in her mother; Emphysema in her maternal grandfather and paternal grandfather; Heart attack in her father; Heart disease (age of onset: 20) in her father; Hypertension in her father; Leukemia in her paternal grandmother; Lung cancer in her mother; Parkinson's disease in her father.   ROS:  3 Please see the history of present illness.    ROS All other systems reviewed and are negative.   PHYSICAL EXAM:   VS:  BP 90/62   Pulse 100   Ht 5\' 3"  (1.6 m)   Wt 181 lb 1.9 oz (82.2 kg)   SpO2 98%   BMI 32.08 kg/m    GEN: Well nourished, well developed, in no acute distress  HEENT: normal  Neck: no JVD, carotid bruits, or masses Cardiac: Irrregular; 2/6 systolic murmurs, rubs, or gallops,no edema  Respiratory:  clear to auscultation bilaterally, normal work of breathing GI: soft, nontender, nondistended, + BS MS: s/p RBKA Skin: warm and dry, no rash Neuro:  Alert and Oriented x 3, Strength and sensation are intact Psych: euthymic mood, full affect  Wt Readings from Last 3 Encounters:  02/12/19 181 lb  1.9 oz (82.2 kg)  02/05/19 177 lb 12.8 oz (80.6 kg)  01/22/19 178 lb (80.7 kg)      Studies/Labs Reviewed:   EKG:  EKG is ordered today.  The ekg ordered today demonstrates atrial fibrillation at rate of 103 bpm  Recent Labs: 10/18/2018: TSH 0.474 01/18/2019: ALT 15; Hemoglobin 9.5; NT-Pro BNP 2,645; Platelets 377 01/22/2019: BUN 26; Creatinine, Ser 1.19; Magnesium 1.9; Potassium 4.7; Sodium 135   Lipid Panel    Component Value Date/Time   CHOL 118 10/18/2018 1135   TRIG 152 (H) 10/18/2018 1135   HDL 38 (L) 10/18/2018 1135   CHOLHDL 3.1 10/18/2018 1135   CHOLHDL 5.8 (H) 02/05/2016 1047   VLDL 76 (H) 02/05/2016 1047   LDLCALC 50 10/18/2018 1135   LDLDIRECT 97 01/06/2017 0904    Additional studies/ records that were reviewed today include:   Echo 05/16/18 Study Conclusions  - Left ventricle: The cavity size was normal. Wall thickness was increased in a pattern of severe LVH. Systolic function was normal. The estimated ejection fraction was in the range of 60% to 65%. Wall motion was normal; there were no regional wall motion abnormalities. There was an increased relative contribution of atrial contraction to ventricular filling. Doppler parameters are consistent with abnormal left ventricular relaxation (grade 1 diastolic dysfunction).  Doppler parameters are consistent with high ventricular filling pressure. - Aortic valve: Valve mobility was restricted. There was mild to moderate stenosis. Mean gradient (S): 17 mm Hg. VTI ratio of LVOT to aortic valve: 0.36. Valve area (VTI): 1.37 cm^2. Valve area (Vmax): 1.22 cm^2. Valve area (Vmean): 1.27 cm^2. - Mitral valve: Calcified annulus. Valve area by pressure half-time: 2.39 cm^2. - Left atrium: The atrium was moderately dilated. - Pulmonary arteries: Systolic pressure could not be accurately estimated.  Echo 06/2018 through [facility] Interpretation Summary A complete two-dimensional transthoracic  echocardiogram with color flow Doppler and spectral Doppler was performed. There is no comparison study available.  The left ventricle is normal in size. Proximal septal thickening is noted. The left ventricular ejection fraction is normal (55-60%). The left ventricular wall motion is normal. Grade I mild diastolic dysfunction; abnormal relaxation pattern. The right ventricle is grossly normal in size and function. The left and right atria are normal size. The mitral valve leaflets appear calcified, but not stenotic. Estimation of right ventricular systolic pressure is not possible. There is heavy calcification of the aortic valve. By gradients, patient falls into category of mild aortic stenosis. Visually and by VTI, likely more in the moderate range. The IVC is normal in size.  Left Ventricle The left ventricle is normal in size. Proximal septal thickening is noted. The left ventricular ejection fraction is normal (55-60%). The left ventricular wall motion is normal. Grade I mild diastolic dysfunction; abnormal relaxation pattern.   Right Ventricle The right ventricle is grossly normal in size and function.  Atria The left and right atria are normal size. The IVC is normal in size.  Mitral Valve There is severe mitral annular calcification. The mitral valve leaflets appear thickened, but open well. The mitral valve leaflets appear calcified, but not stenotic. There is trace mitral regurgitation.   Tricuspid Valve The tricuspid valve leaflets are thin and pliable and the valve motion is normal. There is trace tricuspid regurgitation. Estimation of right ventricular systolic pressure is not possible.  Aortic Valve There is heavy calcification of the aortic valve. By gradients, patient falls into category of mild aortic stenosis. Visually and by VTI, likely more in the moderate range. There is trace aortic regurgitation present.  Pulmonic Valve The pulmonic valve is not  well visualized. There is no pulmonic regurgitation.  Vessels  The aortic root is normal in diameter. The pulmonary artery is not well Visualized.  Nuc study 10/19/18 Study Highlights    Nuclear stress EF: 57%.  There was no ST segment deviation noted during stress.  This is a low risk study.  The left ventricular ejection fraction is normal (55-65%).  Low risk stress test with no evidence of ischemia or infarction    03/06/15: LHC/PCI Left Anterior Descending  Prox LAD to Mid LAD lesion 40% stenosed  calcified diffuse.  Left Circumflex  Mid Cx lesion 80% stenosed  calcified.  Second Obtuse Marginal Branch  Ost 2nd Mrg to 2nd Mrg lesion 70% stenosed  calcified diffuse.  Right Coronary Artery  Collaterals  Dist RCA filled by collaterals from Dist LAD.    Prox RCA lesion 100% stenosed  calcified diffuse.  Intervention      ASSESSMENT & PLAN:    1. SOB - She had reassuring stress test 10/2018. Echo in jan 2020 showed normal LVEF with grade 1 DD.  -Noted in atrial fibrillation at rate of 103 bpm.  She was unable to tell if she is out of rhythm.  Some  good days and some bad.  Seems her symptoms due to recurrent atrial fibrillation.  See below.  May need left and right cath if ongoing dyspnea after restoration of normal sinus rhythm.  2. CAD -No angina.  No change in therapy.  3. Mild to moderate AS -  Mean gradient (S): 17 mm Hg by echo in 05/2018.  4.  Persistent atrial fibrillation -Again noted in atrial fibrillation.  Reviewed with Dr. Radford Pax.  Most likely atrial fibrillation leading to her current symptoms.  We will have her follow-up in atrial fibrillation clinic.  Continue current dose of amiodarone, Cardizem and metoprolol.  Unable to uptitrate rate control regimen due to soft blood pressure.  She is on Eliquis for anticoagulation.  No bleeding issue.    Medication Adjustments/Labs and Tests Ordered: Current medicines are reviewed at length  with the patient today.  Concerns regarding medicines are outlined above.  Medication changes, Labs and Tests ordered today are listed in the Patient Instructions below. Patient Instructions  Medication Instructions:  Your physician recommends that you continue on your current medications as directed. Please refer to the Current Medication list given to you today.  If you need a refill on your cardiac medications before your next appointment, please call your pharmacy.   Lab work: None ordered  If you have labs (blood work) drawn today and your tests are completely normal, you will receive your results only by: Marland Kitchen MyChart Message (if you have MyChart) OR . A paper copy in the mail If you have any lab test that is abnormal or we need to change your treatment, we will call you to review the results.  Testing/Procedures: None ordered  Follow-Up:  You have been scheduled to see Roderic Palau, NP at the [facility], 02/14/2019, arrive at 1:15 for registration.    At [facility], you and your health needs are our priority.  As part of our continuing mission to provide you with exceptional heart care, we have created designated Provider Care Teams.  These Care Teams include your primary Cardiologist (physician) and Advanced Practice Providers (APPs -  Physician Assistants and Nurse Practitioners) who all work together to provide you with the care you need, when you need it. You will need a follow up appointment in 6 months.  Please call our office 2 months in advance to schedule this appointment.  You may see Fransico Him, MD or one of the following Advanced Practice Providers on your designated Care Team:   [place], PA-C Melina Copa, PA-C . Ermalinda Barrios, PA-C  Any Other Special Instructions Will Be Listed Below (If Applicable).       Jarrett Soho, [place]  02/12/2019 11:49 AM    [facility] [address], [place], [place]  [address] Phone:  [phone]; Fax: [phone]

## 2019-02-09 NOTE — Telephone Encounter (Signed)
Per Dr. Loanne Drilling, unable to complete orders for [facility] without an appt. Routing this message to the front desk for scheduling purposes.

## 2019-02-09 NOTE — Telephone Encounter (Signed)
Patient scheduled for 02/13/2019

## 2019-02-12 ENCOUNTER — Other Ambulatory Visit: Payer: Self-pay

## 2019-02-12 ENCOUNTER — Ambulatory Visit (INDEPENDENT_AMBULATORY_CARE_PROVIDER_SITE_OTHER): Payer: Medicare PPO | Admitting: Physician Assistant

## 2019-02-12 ENCOUNTER — Encounter: Payer: Self-pay | Admitting: Physician Assistant

## 2019-02-12 VITALS — BP 90/62 | HR 100 | Ht 63.0 in | Wt 181.1 lb

## 2019-02-12 DIAGNOSIS — I480 Paroxysmal atrial fibrillation: Secondary | ICD-10-CM | POA: Diagnosis not present

## 2019-02-12 DIAGNOSIS — I350 Nonrheumatic aortic (valve) stenosis: Secondary | ICD-10-CM | POA: Diagnosis not present

## 2019-02-12 DIAGNOSIS — I25118 Atherosclerotic heart disease of native coronary artery with other forms of angina pectoris: Secondary | ICD-10-CM | POA: Diagnosis not present

## 2019-02-12 DIAGNOSIS — I10 Essential (primary) hypertension: Secondary | ICD-10-CM | POA: Diagnosis not present

## 2019-02-12 MED ORDER — DILTIAZEM HCL ER COATED BEADS 120 MG PO CP24: 120.0000 mg | ORAL_CAPSULE | Freq: Every day | ORAL | 3 refills | Status: AC

## 2019-02-12 NOTE — Patient Instructions (Addendum)
Medication Instructions:  Your physician recommends that you continue on your current medications as directed. Please refer to the Current Medication list given to you today.  If you need a refill on your cardiac medications before your next appointment, please call your pharmacy.   Lab work: None ordered  If you have labs (blood work) drawn today and your tests are completely normal, you will receive your results only by: Marland Kitchen MyChart Message (if you have MyChart) OR . A paper copy in the mail If you have any lab test that is abnormal or we need to change your treatment, we will call you to review the results.  Testing/Procedures: None ordered  Follow-Up:  You have been scheduled to see Roderic Palau, NP at the [facility], 02/14/2019, arrive at 1:15 for registration.    At [facility], you and your health needs are our priority.  As part of our continuing mission to provide you with exceptional heart care, we have created designated Provider Care Teams.  These Care Teams include your primary Cardiologist (physician) and Advanced Practice Providers (APPs -  Physician Assistants and Nurse Practitioners) who all work together to provide you with the care you need, when you need it. You will need a follow up appointment in 6 months.  Please call our office 2 months in advance to schedule this appointment.  You may see Fransico Him, MD or one of the following Advanced Practice Providers on your designated Care Team:   [place], PA-C Melina Copa, PA-C . Ermalinda Barrios, PA-C  Any Other Special Instructions Will Be Listed Below (If Applicable).

## 2019-02-13 ENCOUNTER — Ambulatory Visit (INDEPENDENT_AMBULATORY_CARE_PROVIDER_SITE_OTHER): Payer: Medicare PPO | Admitting: Endocrinology

## 2019-02-13 ENCOUNTER — Encounter: Payer: Self-pay | Admitting: Endocrinology

## 2019-02-13 VITALS — BP 98/62 | HR 110 | Ht 63.0 in | Wt 179.8 lb

## 2019-02-13 DIAGNOSIS — E0590 Thyrotoxicosis, unspecified without thyrotoxic crisis or storm: Secondary | ICD-10-CM

## 2019-02-13 DIAGNOSIS — E1142 Type 2 diabetes mellitus with diabetic polyneuropathy: Secondary | ICD-10-CM

## 2019-02-13 LAB — POCT GLYCOSYLATED HEMOGLOBIN (HGB A1C): Hemoglobin A1C: 7.3 % — AB (ref 4.0–5.6)

## 2019-02-13 LAB — T4, FREE: Free T4: 2 ng/dL — ABNORMAL HIGH (ref 0.60–1.60)

## 2019-02-13 LAB — TSH: TSH: 0.11 u[IU]/mL — ABNORMAL LOW (ref 0.35–4.50)

## 2019-02-13 MED ORDER — METHIMAZOLE 10 MG PO TABS: 15.0000 mg | ORAL_TABLET | Freq: Every day | ORAL | 1 refills | Status: DC

## 2019-02-13 NOTE — Progress Notes (Signed)
Subjective:    Patient ID: Judith [name], female    DOB: 11-17-1950, 68 y.o.   MRN: [id number]  HPI Pt returns for f/u of hyperthyroidism (pt says she took synthroid for a brief time in the 1990's, in an attempt to shrink a goiter; slightly suppressed TSH was first noted in late 2016, when she was in the hospital for new-onset AF; she converted back to SR; nuc med scan showed heterogeneous uptake; neck scar is from C-spine procedure). she takes tapazole as rx'ed.  pt states she feels well in general.   Pt also ret for f/u of DM: DM type: Insulin-requiring type 2. Dx'ed: 6734 Complications: polyneuropathy, renal failure, DR, right BKA, and CAD.   Therapy: insulin since 1997, and metformin.   GDM: never.  DKA: never. Severe hypoglycemia: never.   Pancreatitis: never.   Other: she said reg and novolog both caused sob, so she was changed to qd insulin.   Interval history: pt states she feels well in general.  no cbg record, but states cbg's in the 100's.  She says she never misses the insulin.   Past Medical History:  Diagnosis Date  . Abnormal EKG 07/31/2013  . Arthritis    "hands" (03/06/2015)  . Asthma   . Carpal tunnel syndrome, bilateral   . Chronic kidney disease   . Complication of anesthesia    slow to wake up. Pt sts she woke up during surgery many years ago.  . Coronary artery disease     2 v CAD with CTO of the RCA and high grade bifurcational LCx/OM stenosis. S/P PCI DES x 2 to the LCx/OM.  Marland Kitchen Diabetic peripheral neuropathy ([place]) "since 1996"  . GERD (gastroesophageal reflux disease)   . Goiter   . Headache    migraines prior to menopause  . History of shingles 06/01/2013  . Hyperlipidemia LDL goal <70 10/13/2015  . Hypertension   . Hyperthyroidism   . Osteomyelitis of foot (HCC)    Right  . PAF (paroxysmal atrial fibrillation) ([place]) 04/29/2015   CHADS2VASC score of 5 now on Apixaban  . Pneumonia ~ 1976  . Sleep apnea    Bipap  . Tremors of nervous system   . Type  II diabetes mellitus (HCC)    insulin dependent    Past Surgical History:  Procedure Laterality Date  . ABDOMINAL HYSTERECTOMY  1988   age 83; CERVICAL DYSPLASIA; ovaries intact.   . AMPUTATION Right 01/23/2016   Procedure: Right 3rd Ray Amputation;  Surgeon: [name], MD;  Location: [facility];  Service: Orthopedics;  Laterality: Right;  . AMPUTATION Right 02/13/2016   Procedure: Right Transmetatarsal Amputation;  Surgeon: [name], MD;  Location: [facility];  Service: Orthopedics;  Laterality: Right;  . AMPUTATION Right 04/15/2018   Procedure: AMPUTATION BELOW KNEE;  Surgeon: [name], MD;  Location: [facility];  Service: Orthopedics;  Laterality: Right;  . BIOPSY  04/16/2018   Procedure: BIOPSY;  Surgeon: Irene Shipper, MD;  Location[facility] END[facility]COPY;  Service: Endoscopy;;  . CARDIAC CATHETERIZATION N/A 02/27/2015   Procedure: Left Heart Cath and Coronary Angiography;  Surgeon: [name], MD; LAD 40%, mCFX 80%, OM 70%, RCA 100% calcified       . CARDIAC CATHETERIZATION N/A 03/06/2015   Procedure: Coronary Stent Intervention;  Surgeon: [name], MD;  Location: [facility];  Service: Cardiovascular;  Laterality: N/A;  Mid CX 3.50x12 promus DES w/ 0% resdual and Prox OM1 2.50x20 promus DES w/  20% residual  . CARDIOVERSION    . CARDIOVERSION N/A 05/22/2018   Procedure: CARDIOVERSION;  Surgeon: Skeet Latch, MD;  Location: [facility];  Service: Cardiovascular;  Laterality: N/A;  . CARPAL TUNNEL RELEASE Right Nov 2015  . CARPAL TUNNEL RELEASE Right 1992; 05/2014   [name]; [place]  . CESAREAN SECTION  1982; 1984  . ESOPHAGOGASTRODUODENOSCOPY (EGD) WITH PROPOFOL N/A 04/16/2018   Procedure: ESOPHAGOGASTRODUODENOSCOPY (EGD) WITH PROPOFOL;  Surgeon: Irene Shipper, MD;  Location: [facility] ENDOSCOPY;  Service: Endoscopy;  Laterality: N/A;  . FOOT NEUROMA SURGERY Bilateral 2000  . I&D EXTREMITY Left 01/06/2018   Procedure: DEBRIDEMENT ULCER LEFT FOOT;  Surgeon: [name], MD;   Location: [facility];  Service: Orthopedics;  Laterality: Left;  . KNEE ARTHROSCOPY Right ~ 2003   "meniscus repair"  . LIPOMA EXCISION Right 2020   thigh  . SHOULDER OPEN ROTATOR CUFF REPAIR Right 1996; 1998   "w/fracture repair"  . THYROID SURGERY  2000   "removed lots of nodules"  . TONSILLECTOMY  1976    Social History   Socioeconomic History  . Marital status: Single    Spouse name: Not on file  . Number of children: 2  . Years of education: Masters  . Highest education level: Not on file  Occupational History  . Occupation: Landscape architect  Social Needs  . Financial resource strain: Somewhat hard  . Food insecurity    Worry: Sometimes true    Inability: Sometimes true  . Transportation needs    Medical: No    Non-medical: No  Tobacco Use  . Smoking status: Former Smoker    Packs/day: 0.00    Years: 41.00    Pack years: 0.00    Types: Cigarettes    Quit date: 03/05/2015    Years since quitting: 3.9  . Smokeless tobacco: Never Used  . Tobacco comment: 04/29/2015 "quit smoking cigarettes 02/27/2015"  Substance and Sexual Activity  . Alcohol use: No  . Drug use: No  . Sexual activity: Not Currently    Birth control/protection: Post-menopausal, Surgical  Lifestyle  . Physical activity    Days per week: 0 days    Minutes per session: 0 min  . Stress: Very much  Relationships  . Social connections    Talks on phone: More than three times a week    Gets together: More than three times a week    Attends religious service: More than 4 times per year    Active member of club or organization: No    Attends meetings of clubs or organizations: Never    Relationship status: Never married  . Intimate partner violence    Fear of current or ex partner: No    Emotionally abused: No    Physically abused: No    Forced sexual activity: No  Other Topics Concern  . 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      Lives with sister in [place].      Tobacco: 1 ppd x 41 years - quit 2016      Alcohol: none      Drugs: none      Exercise:  Walking in neighborhood; physical job.   Right-handed.   2 cups caffeine daily.  Current Outpatient Medications on File Prior to Visit  Medication Sig Dispense Refill  . albuterol (PROVENTIL HFA) 108 (90 Base) MCG/ACT inhaler Inhale 1-2 puffs into the lungs every 6 (six) hours as needed for wheezing or shortness of breath. 1 Inhaler 0  . amiodarone (PACERONE) 200 MG tablet Take 1 tablet (200 mg total) by mouth daily. 30 tablet 6  . atorvastatin (LIPITOR) 20 MG tablet Take 1 tablet (20 mg total) by mouth daily. 90 tablet 3  . buPROPion (WELLBUTRIN) 75 MG tablet Take 75 mg by mouth 2 (two) times daily.    . cetirizine (ZYRTEC) 10 MG tablet Take 10 mg by mouth daily as needed for allergies.     . cholestyramine (QUESTRAN) 4 g packet Take 0.5 packets (2 g total) by mouth at bedtime. (Patient not taking: Reported on 02/15/2019) 60 each 0  . diltiazem (CARTIA XT) 120 MG 24 hr capsule Take 1 capsule (120 mg total) by mouth daily. 90 capsule 3  . ELIQUIS 5 MG TABS tablet Take 1 tablet by mouth twice daily (Patient taking differently: Take 5 mg by mouth 2 (two) times daily. ) 180 tablet 1  . famotidine (PEPCID) 20 MG tablet Take 1 tablet (20 mg total) by mouth 2 (two) times daily. (Patient taking differently: Take 40 mg by mouth daily. ) 60 tablet 3  . FLUoxetine (PROZAC) 20 MG capsule TAKE 1 CAPSULE AT BEDTIME (Patient taking differently: Take 20 mg by mouth at bedtime. ) 90 capsule 0  . fluticasone (FLOVENT HFA) 44 MCG/ACT inhaler Inhale 2 puffs into the lungs 2 (two) times daily. (Patient taking differently: Inhale 2 puffs into the lungs daily as needed (Allergies). ) 1 Inhaler 3  . furosemide (LASIX) 40  MG tablet Take 1 tablet (40 mg total) by mouth daily. 30 tablet 2  . hydrALAZINE (APRESOLINE) 50 MG tablet Take 1 tablet (50 mg total) by mouth 3 (three) times daily. 270 tablet 3  . insulin degludec (TRESIBA FLEXTOUCH) 100 UNIT/ML SOPN FlexTouch Pen Inject 20 Units into the skin at bedtime.     Marland Kitchen loratadine (CLARITIN) 10 MG tablet Take 10 mg by mouth at bedtime.    . magnesium oxide (MAG-OX) 400 MG tablet Take 400 mg by mouth at bedtime.     . Melatonin 10 MG TABS Take 10 mg by mouth at bedtime as needed (Sleep).     . metFORMIN (GLUCOPHAGE-XR) 500 MG 24 hr tablet Take 1,000 mg by mouth daily with breakfast.    . metoprolol succinate (TOPROL-XL) 25 MG 24 hr tablet Take 50 mg by mouth 2 (two) times daily.     . mupirocin ointment (BACTROBAN) 2 % Apply 1 application topically 2 (two) times daily as needed (wound/sores).     . nitroGLYCERIN (NITROSTAT) 0.4 MG SL tablet Place 0.4 mg under the tongue every 5 (five) minutes x 3 doses as needed for chest pain.     . polyethylene glycol powder (GLYCOLAX/MIRALAX) 17 GM/SCOOP powder Take 17 g by mouth daily as needed (constipation.).     Marland Kitchen potassium chloride (K-DUR) 10 MEQ tablet Take 1 tablet (10 mEq total) by mouth 2 (two) times daily. (Patient taking differently: Take 10 mEq by mouth daily. ) 60 tablet 1  . Respiratory Therapy Supplies DEVI BI-PAP @@ Bedtime    . triamcinolone (KENALOG) 0.1 % paste Use as directed 1 application in the mouth or throat 2 (two) times daily as needed (ulcer lesion.). 5 g 0  . TRUE METRIX BLOOD GLUCOSE TEST test  strip TEST BLOOD SUGAR TWICE DAILY 100 each 3   Current Facility-Administered Medications on File Prior to Visit  Medication Dose Route Frequency Provider Last Rate Last Dose  . 0.9 %  sodium chloride infusion  500 mL Intravenous Once Nandigam, Venia Minks, MD        Allergies  Allergen Reactions  . Contrast Media [Iodinated Diagnostic Agents] Hives and Other (See Comments)    Spoke to patient, Iodine allergy is  really IV contrast allergy.   [name] [Insulin Aspart] Shortness Of Breath and Other (See Comments)    "breathing problems"  . Codeine Nausea And Vomiting and Other (See Comments)    HIGH DOSES-SEVERE VOMITING  . Iodine Other (See Comments)    MUST HAVE BENADRYL PRIOR TO PROCEDURE AND RIGHT BEFORE TREATMENT TO COUNTERACT REACTION-BLISTERING REACTION DERMATOLOGICAL  . Penicillins Itching, Rash and Other (See Comments)    Has patient had a PCN reaction causing immediate rash, facial/tongue/throat swelling, SOB or lightheadedness with hypotension: no Has patient had a PCN reaction causing severe rash involving mucus membranes or skin necrosis: No Has patient had a PCN reaction that required hospitalization No Has patient had a PCN reaction occurring within the last 10 years: No If all of the above answers are "NO", then may proceed with Cephalosporin use.  CHEST SIZED RASH AND ITCHING   . Ace Inhibitors Cough  . Demerol [Meperidine] Nausea And Vomiting  . Dilaudid [Hydromorphone Hcl] Other (See Comments)    HEADACHE   . Neosporin [Neomycin-Bacitracin Zn-Polymyx] Itching, Rash and Other (See Comments)    MAKES REACTIONS WORSE WHEN USING AS PROPHYLACTIC  . Percocet [Oxycodone-Acetaminophen] Rash  . Tape Itching and Rash    Family History  Problem Relation Age of Onset  . Cancer Mother 5       bronchial cancer  . Breast cancer Mother   . Lung cancer Mother   . Hypertension Father   . COPD Father   . Heart disease Father 8       CAD with cardiac stenting  . Heart attack Father   . Parkinson's disease Father   . Allergies Sister   . Breast cancer Maternal Grandmother   . Emphysema Maternal Grandfather   . Leukemia Paternal Grandmother   . Emphysema Paternal Grandfather   . Thyroid disease Neg Hx   . Colon cancer Neg Hx   . Stomach cancer Neg Hx   . Rectal cancer Neg Hx   . Esophageal cancer Neg Hx     BP 98/62 (BP Location: Left Arm, Patient Position: Sitting, Cuff Size:  Normal)   Pulse (!) 110   Ht '5\' 3"'  (1.6 m)   Wt 179 lb 12.8 oz (81.6 kg)   SpO2 94%   BMI 31.85 kg/m    Review of Systems She denies hypoglycemia, but she has hair loss.       Objective:   Physical Exam VITAL SIGNS:  See vs page GENERAL: no distress.  In wheelchair Pulses: left foot pulses are intact.   MSK: no deformity, except for right BKA CV: 1+ left leg edema. Skin:  No ulcer on the feet or ankles.  normal temp on the left foot and ankle. Neuro: sensation is intact to touch on the left foot and ankle, but severely decreased from normal Ext: There is onychomycosis of the left foot toenails.    Lab Results  Component Value Date   HGBA1C 7.3 (A) 02/13/2019      Assessment & Plan:  Hair loss: I  told pt I am uncertain if this is thyroid-related Insulin-requiring type 2 DM, with renal failure: this is the best control this pt should aim for, given this regimen, which does match insulin to her changing needs throughout the day Hyperthyroidism: recheck today.   Patient Instructions  Please continue the same insulin.  Blood tests are requested for you today.  We'll let you know about the results.  check your blood sugar twice a day.  vary the time of day when you check, between before the 3 meals, and at bedtime.  also check if you have symptoms of your blood sugar being too high or too low.  please keep a record of the readings and bring it to your next appointment here (or you can bring the meter itself).  You can write it on any piece of paper.  please call us sooner if your blood sugar goes below 70, or if you have a lot of readings over 200. Please come back for a follow-up appointment in 3 months.

## 2019-02-13 NOTE — Patient Instructions (Signed)
Please continue the same insulin.  Blood tests are requested for you today.  We'll let you know about the results.  check your blood sugar twice a day.  vary the time of day when you check, between before the 3 meals, and at bedtime.  also check if you have symptoms of your blood sugar being too high or too low.  please keep a record of the readings and bring it to your next appointment here (or you can bring the meter itself).  You can write it on any piece of paper.  please call us sooner if your blood sugar goes below 70, or if you have a lot of readings over 200. Please come back for a follow-up appointment in 3 months.

## 2019-02-14 ENCOUNTER — Ambulatory Visit (HOSPITAL_COMMUNITY)
Admission: RE | Admit: 2019-02-14 | Discharge: 2019-02-14 | Disposition: A | Discharge status: Home / Self Care | Payer: Medicare PPO | Source: Ambulatory Visit | Attending: Nurse Practitioner | Admitting: Nurse Practitioner

## 2019-02-14 ENCOUNTER — Encounter (HOSPITAL_COMMUNITY): Payer: Self-pay | Admitting: Nurse Practitioner

## 2019-02-14 ENCOUNTER — Other Ambulatory Visit: Payer: Self-pay

## 2019-02-14 ENCOUNTER — Telehealth: Payer: Self-pay

## 2019-02-14 VITALS — BP 118/88 | HR 103 | Ht 63.0 in | Wt 179.6 lb

## 2019-02-14 DIAGNOSIS — Z881 Allergy status to other antibiotic agents status: Secondary | ICD-10-CM | POA: Insufficient documentation

## 2019-02-14 DIAGNOSIS — Z886 Allergy status to analgesic agent status: Secondary | ICD-10-CM | POA: Insufficient documentation

## 2019-02-14 DIAGNOSIS — Z7901 Long term (current) use of anticoagulants: Secondary | ICD-10-CM | POA: Insufficient documentation

## 2019-02-14 DIAGNOSIS — I4892 Unspecified atrial flutter: Secondary | ICD-10-CM | POA: Diagnosis not present

## 2019-02-14 DIAGNOSIS — E1151 Type 2 diabetes mellitus with diabetic peripheral angiopathy without gangrene: Secondary | ICD-10-CM | POA: Diagnosis not present

## 2019-02-14 DIAGNOSIS — G4733 Obstructive sleep apnea (adult) (pediatric): Secondary | ICD-10-CM | POA: Diagnosis not present

## 2019-02-14 DIAGNOSIS — Z794 Long term (current) use of insulin: Secondary | ICD-10-CM | POA: Insufficient documentation

## 2019-02-14 DIAGNOSIS — R9431 Abnormal electrocardiogram [ECG] [EKG]: Secondary | ICD-10-CM | POA: Insufficient documentation

## 2019-02-14 DIAGNOSIS — Z801 Family history of malignant neoplasm of trachea, bronchus and lung: Secondary | ICD-10-CM | POA: Diagnosis not present

## 2019-02-14 DIAGNOSIS — N189 Chronic kidney disease, unspecified: Secondary | ICD-10-CM | POA: Diagnosis not present

## 2019-02-14 DIAGNOSIS — Z87891 Personal history of nicotine dependence: Secondary | ICD-10-CM | POA: Diagnosis not present

## 2019-02-14 DIAGNOSIS — I4819 Other persistent atrial fibrillation: Secondary | ICD-10-CM

## 2019-02-14 DIAGNOSIS — Z8249 Family history of ischemic heart disease and other diseases of the circulatory system: Secondary | ICD-10-CM | POA: Diagnosis not present

## 2019-02-14 DIAGNOSIS — Z79899 Other long term (current) drug therapy: Secondary | ICD-10-CM | POA: Diagnosis not present

## 2019-02-14 DIAGNOSIS — E785 Hyperlipidemia, unspecified: Secondary | ICD-10-CM | POA: Insufficient documentation

## 2019-02-14 DIAGNOSIS — I2510 Atherosclerotic heart disease of native coronary artery without angina pectoris: Secondary | ICD-10-CM | POA: Insufficient documentation

## 2019-02-14 DIAGNOSIS — Z89511 Acquired absence of right leg below knee: Secondary | ICD-10-CM | POA: Insufficient documentation

## 2019-02-14 DIAGNOSIS — Z803 Family history of malignant neoplasm of breast: Secondary | ICD-10-CM | POA: Diagnosis not present

## 2019-02-14 DIAGNOSIS — M19049 Primary osteoarthritis, unspecified hand: Secondary | ICD-10-CM | POA: Insufficient documentation

## 2019-02-14 DIAGNOSIS — E0590 Thyrotoxicosis, unspecified without thyrotoxic crisis or storm: Secondary | ICD-10-CM | POA: Insufficient documentation

## 2019-02-14 DIAGNOSIS — Z836 Family history of other diseases of the respiratory system: Secondary | ICD-10-CM | POA: Insufficient documentation

## 2019-02-14 DIAGNOSIS — Z885 Allergy status to narcotic agent status: Secondary | ICD-10-CM | POA: Diagnosis not present

## 2019-02-14 DIAGNOSIS — K219 Gastro-esophageal reflux disease without esophagitis: Secondary | ICD-10-CM | POA: Insufficient documentation

## 2019-02-14 DIAGNOSIS — Z91041 Radiographic dye allergy status: Secondary | ICD-10-CM | POA: Diagnosis not present

## 2019-02-14 DIAGNOSIS — Z806 Family history of leukemia: Secondary | ICD-10-CM | POA: Insufficient documentation

## 2019-02-14 DIAGNOSIS — I129 Hypertensive chronic kidney disease with stage 1 through stage 4 chronic kidney disease, or unspecified chronic kidney disease: Secondary | ICD-10-CM | POA: Diagnosis not present

## 2019-02-14 DIAGNOSIS — Z880 Allergy status to penicillin: Secondary | ICD-10-CM | POA: Diagnosis not present

## 2019-02-14 DIAGNOSIS — Z888 Allergy status to other drugs, medicaments and biological substances status: Secondary | ICD-10-CM | POA: Insufficient documentation

## 2019-02-14 LAB — BASIC METABOLIC PANEL
Anion gap: 13 (ref 5–15)
BUN: 24 mg/dL — ABNORMAL HIGH (ref 8–23)
CO2: 20 mmol/L — ABNORMAL LOW (ref 22–32)
Calcium: 9 mg/dL (ref 8.9–10.3)
Chloride: 100 mmol/L (ref 98–111)
Creatinine, Ser: 1.27 mg/dL — ABNORMAL HIGH (ref 0.44–1.00)
GFR calc Af Amer: 51 mL/min — ABNORMAL LOW (ref >60)
GFR calc non Af Amer: 44 mL/min — ABNORMAL LOW (ref >60)
Glucose, Bld: 208 mg/dL — ABNORMAL HIGH (ref 70–99)
Potassium: 4.3 mmol/L (ref 3.5–5.1)
Sodium: 133 mmol/L — ABNORMAL LOW (ref 135–145)

## 2019-02-14 LAB — CBC
HCT: 33.4 % — ABNORMAL LOW (ref 36.0–46.0)
Hemoglobin: 10.5 g/dL — ABNORMAL LOW (ref 12.0–15.0)
MCH: 28.2 pg (ref 26.0–34.0)
MCHC: 31.4 g/dL (ref 30.0–36.0)
MCV: 89.8 fL (ref 80.0–100.0)
Platelets: 312 10*3/uL (ref 150–400)
RBC: 3.72 MIL/uL — ABNORMAL LOW (ref 3.87–5.11)
RDW: 15 % (ref 11.5–15.5)
WBC: 6.3 10*3/uL (ref 4.0–10.5)
nRBC: 0 % (ref 0.0–0.2)

## 2019-02-14 NOTE — Progress Notes (Signed)
Primary Care Physician: Rutherford Guys, MD Referring Physician:[facility] f/u EP: Dr. Rayann Heman Cardiologist: Dr. Laurell Josephs is a 68 y.o. 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  She was admitted 04/14/18 with osteomyelitis of the Rt foot. Admission EKG showed atrial flutter with 2-1 conduction. She was seen by the Hospitalist service and placed on Diltiazem with improvement in her rate but she remained in atrial flutter (Cardiology was not consulted). On 04/15/18 she had Rt transtibial amputation. Post op course complicated by GI bleeding and anemia requiring transfusion. Endoscopy 04/16/18 showed esophagitis. She also had acute diastolic CHF and had to be diuresed. Eliquis was resumed, according to the records she remained in AF and was transferred to rehab. She was discharged 05/02/18.  She came to the ER  via private vehicle, reported to family not feeling well, nauseated, and needed to go to the ER, HPI was limited with some ongoing lethargy.  Reportedly en-route she had what appeared to her sister a seizure, upon arrival to the ER she was noted on telemetry to have AFib w/RVR, episodes of NSVT vs abbarency, cardiology consulted.  She had received amiodarone IV and continued  on po amiodarone.and metoprolol in the ER that blunted v rates, her sotalol stopped, though no appreciation of QT prolongation.  Dr. Harrell Gave worried these were VT given she had not historically had BBB with her rapid AF, and perhaps syncope (vs seizure).Dr. Lovena Le saw pt in consult and felt it was less likely to be v tach.   She had cardioversion 1/13 which was successful but unfortunately today is back in  afib with RVR, from which she is asymptomatic. She continued to load on amiodarone 200 mg bid and  eventually in January was successfully cardioverted.  She is now back in afib clinic,02/15/19. She was found to be back in afib yesterday at [facility] and referred here. She has been c/o of increased shortness of breath for several weeks but did not think  she was in afib, as previously  it was associated with chest discomfort. She feels her weight may be up a few pounds.   Today, she denies symptoms of palpitations, chest pain, shortness of breath, orthopnea, PND, lower extremity edema, dizziness, presyncope, syncope, or neurologic sequela. The patient is tolerating medications without difficulties and is otherwise without complaint today.   Past Medical History:  Diagnosis Date  . Abnormal EKG 07/31/2013  . Arthritis    "hands" (03/06/2015)  . Asthma   . Carpal tunnel syndrome, bilateral   . Chronic kidney disease   . Complication of anesthesia    slow to wake up. Pt sts she woke up during surgery many years ago.  . Coronary artery disease     2 v CAD with CTO of the RCA and high grade bifurcational LCx/OM stenosis. S/P PCI DES x 2 to the LCx/OM.  Marland Kitchen Diabetic peripheral neuropathy ([place]) "since 1996"  . GERD (gastroesophageal reflux disease)   . Goiter   . Headache    migraines prior to menopause  . History of shingles 06/01/2013  . Hyperlipidemia LDL goal <70 10/13/2015  . Hypertension   . Hyperthyroidism   . Osteomyelitis of foot (HCC)    Right  . PAF (paroxysmal atrial fibrillation) ([place]) 04/29/2015  CHADS2VASC score of 5 now on Apixaban  . Pneumonia ~ 1976  . Sleep apnea    Bipap  . Tremors of nervous system   . Type II diabetes mellitus (HCC)    insulin dependent   Past Surgical History:  Procedure Laterality Date  . ABDOMINAL HYSTERECTOMY  1988   age 32; CERVICAL DYSPLASIA; ovaries intact.   . AMPUTATION Right 01/23/2016   Procedure: Right 3rd Ray Amputation;  Surgeon: [name], MD;  Location: [place];  Service: Orthopedics;  Laterality: Right;  . AMPUTATION Right  02/13/2016   Procedure: Right Transmetatarsal Amputation;  Surgeon: [name], MD;  Location: [place];  Service: Orthopedics;  Laterality: Right;  . AMPUTATION Right 04/15/2018   Procedure: AMPUTATION BELOW KNEE;  Surgeon: [name], MD;  Location: [place];  Service: Orthopedics;  Laterality: Right;  . BIOPSY  04/16/2018   Procedure: BIOPSY;  Surgeon: Irene Shipper, MD;  Location: [facility] ENDOSCOPY;  Service: Endoscopy;;  . CARDIAC CATHETERIZATION N/A 02/27/2015   Procedure: Left Heart Cath and Coronary Angiography;  Surgeon: [name], MD; LAD 40%, mCFX 80%, OM 70%, RCA 100% calcified       . CARDIAC CATHETERIZATION N/A 03/06/2015   Procedure: Coronary Stent Intervention;  Surgeon: [name], MD;  Location: [facility];  Service: Cardiovascular;  Laterality: N/A;  Mid CX 3.50x12 promus DES w/ 0% resdual and Prox OM1 2.50x20 promus DES w/ 20% residual  . CARDIOVERSION    . CARDIOVERSION N/A 05/22/2018   Procedure: CARDIOVERSION;  Surgeon: Skeet Latch, MD;  Location: [place];  Service: Cardiovascular;  Laterality: N/A;  . CARPAL TUNNEL RELEASE Right Nov 2015  . CARPAL TUNNEL RELEASE Right 1992; 05/2014   [name]; [place]  . CESAREAN SECTION  1982; 1984  . ESOPHAGOGASTRODUODENOSCOPY (EGD) WITH PROPOFOL N/A 04/16/2018   Procedure: ESOPHAGOGASTRODUODENOSCOPY (EGD) WITH PROPOFOL;  Surgeon: Irene Shipper, MD;  Location: [facility] ENDOSCOPY;  Service: Endoscopy;  Laterality: N/A;  . FOOT NEUROMA SURGERY Bilateral 2000  . I&D EXTREMITY Left 01/06/2018   Procedure: DEBRIDEMENT ULCER LEFT FOOT;  Surgeon: [name], MD;  Location: [place];  Service: Orthopedics;  Laterality: Left;  . KNEE ARTHROSCOPY Right ~ 2003   "meniscus repair"  . LIPOMA EXCISION Right 2020   thigh  . SHOULDER OPEN ROTATOR CUFF REPAIR Right 1996; 1998   "w/fracture repair"  . THYROID SURGERY  2000   "removed lots of nodules"  . TONSILLECTOMY  1976    Current Outpatient Medications  Medication Sig  Dispense Refill  . albuterol (PROVENTIL HFA) 108 (90 Base) MCG/ACT inhaler Inhale 1-2 puffs into the lungs every 6 (six) hours as needed for wheezing or shortness of breath. 1 Inhaler 0  . amiodarone (PACERONE) 200 MG tablet Take 1 tablet (200 mg total) by mouth daily. (Patient taking differently: Take 200 mg by mouth See admin instructions. Take 1 tablet (200 mg) by mouth scheduled every morning, may repeat dose if needed for afib) 30 tablet 6  . atorvastatin (LIPITOR) 20 MG tablet Take 1 tablet (20 mg total) by mouth daily. 90 tablet 3  . buPROPion (WELLBUTRIN) 75 MG tablet Take 75 mg by mouth 2 (two) times daily.    . cetirizine (ZYRTEC) 10 MG tablet Take 10 mg by mouth daily.    . cholestyramine (QUESTRAN) 4 g packet Take 0.5 packets (2 g total) by mouth at bedtime. 60 each 0  . diltiazem (CARTIA XT) 120 MG 24 hr capsule Take 1 capsule (120  mg total) by mouth daily. 90 capsule 3  . ELIQUIS 5 MG TABS tablet Take 1 tablet by mouth twice daily 180 tablet 1  . famotidine (PEPCID) 20 MG tablet Take 1 tablet (20 mg total) by mouth 2 (two) times daily. 60 tablet 3  . FLUoxetine (PROZAC) 20 MG capsule TAKE 1 CAPSULE AT BEDTIME 90 capsule 0  . fluticasone (FLOVENT HFA) 44 MCG/ACT inhaler Inhale 2 puffs into the lungs 2 (two) times daily. 1 Inhaler 3  . furosemide (LASIX) 40 MG tablet Take 1 tablet (40 mg total) by mouth daily. 30 tablet 2  . hydrALAZINE (APRESOLINE) 50 MG tablet Take 1 tablet (50 mg total) by mouth 3 (three) times daily. 270 tablet 3  . insulin degludec (TRESIBA FLEXTOUCH) 100 UNIT/ML SOPN FlexTouch Pen Inject 20 Units into the skin at bedtime.     Marland Kitchen loratadine (CLARITIN) 10 MG tablet Take 10 mg by mouth at bedtime.    . magnesium oxide (MAG-OX) 400 MG tablet Take 400 mg by mouth at bedtime.     . Melatonin 10 MG TABS Take 10 mg by mouth at bedtime.     . metFORMIN (GLUCOPHAGE-XR) 500 MG 24 hr tablet Take 1,000 mg by mouth daily with breakfast.    . methimazole (TAPAZOLE) 10 MG tablet  Take 1.5 tablets (15 mg total) by mouth daily. (Patient taking differently: Take 10 mg by mouth daily. ) 135 tablet 1  . metoprolol succinate (TOPROL-XL) 25 MG 24 hr tablet Take 25 mg by mouth 2 (two) times daily.    . mupirocin ointment (BACTROBAN) 2 % Apply 1 application topically 2 (two) times daily as needed (wound/sores).     . nitroGLYCERIN (NITROSTAT) 0.4 MG SL tablet Place 0.4 mg under the tongue every 5 (five) minutes x 3 doses as needed for chest pain.     . polyethylene glycol powder (GLYCOLAX/MIRALAX) 17 GM/SCOOP powder Take 17 g by mouth daily as needed (constipation.).     Marland Kitchen potassium chloride (K-DUR) 10 MEQ tablet Take 1 tablet (10 mEq total) by mouth 2 (two) times daily. 60 tablet 1  . Respiratory Therapy Supplies DEVI BI-PAP @@ Bedtime    . triamcinolone (KENALOG) 0.1 % paste Use as directed 1 application in the mouth or throat 2 (two) times daily as needed (ulcer lesion.). 5 g 0  . TRUE METRIX BLOOD GLUCOSE TEST test strip TEST BLOOD SUGAR TWICE DAILY 100 each 3   Current Facility-Administered Medications  Medication Dose Route Frequency Provider Last Rate Last Dose  . 0.9 %  sodium chloride infusion  500 mL Intravenous Once Nandigam, Venia Minks, MD        Allergies  Allergen Reactions  . Contrast Media [Iodinated Diagnostic Agents] Hives and Other (See Comments)    Spoke to patient, Iodine allergy is really IV contrast allergy.   [name] [Insulin Aspart] Shortness Of Breath and Other (See Comments)    "breathing problems"  . Codeine Nausea And Vomiting and Other (See Comments)    HIGH DOSES-SEVERE VOMITING  . Iodine Other (See Comments)    MUST HAVE BENADRYL PRIOR TO PROCEDURE AND RIGHT BEFORE TREATMENT TO COUNTERACT REACTION-BLISTERING REACTION DERMATOLOGICAL  . Penicillins Itching, Rash and Other (See Comments)    Has patient had a PCN reaction causing immediate rash, facial/tongue/throat swelling, SOB or lightheadedness with hypotension: no Has patient had a PCN  reaction causing severe rash involving mucus membranes or skin necrosis: No Has patient had a PCN reaction that required hospitalization No Has patient  had a PCN reaction occurring within the last 10 years: No If all of the above answers are "NO", then may proceed with Cephalosporin use.  CHEST SIZED RASH AND ITCHING   . Ace Inhibitors Cough  . Demerol [Meperidine] Nausea And Vomiting  . Dilaudid [Hydromorphone Hcl] Other (See Comments)    HEADACHE   . Neosporin [Neomycin-Bacitracin Zn-Polymyx] Itching, Rash and Other (See Comments)    MAKES REACTIONS WORSE WHEN USING AS PROPHYLACTIC  . Percocet [Oxycodone-Acetaminophen] Rash  . Tape Itching and Rash    Social History   Socioeconomic History  . Marital status: Single    Spouse name: Not on file  . Number of children: 2  . Years of education: Masters  . Highest education level: Not on file  Occupational History  . Occupation: Landscape architect  Social Needs  . Financial resource strain: Somewhat hard  . Food insecurity    Worry: Sometimes true    Inability: Sometimes true  . Transportation needs    Medical: No    Non-medical: No  Tobacco Use  . Smoking status: Former Smoker    Packs/day: 0.00    Years: 41.00    Pack years: 0.00    Types: Cigarettes    Quit date: 03/05/2015    Years since quitting: 3.9  . Smokeless tobacco: Never Used  . Tobacco comment: 04/29/2015 "quit smoking cigarettes 02/27/2015"  Substance and Sexual Activity  . Alcohol use: No  . Drug use: No  . Sexual activity: Not Currently    Birth control/protection: Post-menopausal, Surgical  Lifestyle  . Physical activity    Days per week: 0 days    Minutes per session: 0 min  . Stress: Very much  Relationships  . Social connections    Talks on phone: More than three times a week    Gets together: More than three times a week    Attends religious service: More than 4 times per year    Active member of club or organization: No    Attends  meetings of clubs or organizations: Never    Relationship status: Never married  . Intimate partner violence    Fear of current or ex partner: No    Emotionally abused: No    Physically abused: No    Forced sexual activity: No  Other Topics Concern  . 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      Lives with sister in [place].      Tobacco: 1 ppd x 41 years - quit 2016      Alcohol: none      Drugs: none      Exercise:  Walking in neighborhood; physical job.   Right-handed.   2 cups caffeine daily.    Family History  Problem Relation Age of Onset  . Cancer Mother 73       bronchial cancer  . Breast cancer Mother   . Lung cancer Mother   . Hypertension Father   . COPD Father   . Heart disease Father 64       CAD with cardiac stenting  . Heart attack Father   . Parkinson's disease Father   . Allergies Sister   . Breast cancer Maternal Grandmother   . Emphysema  Maternal Grandfather   . Leukemia Paternal Grandmother   . Emphysema Paternal Grandfather   . Thyroid disease Neg Hx   . Colon cancer Neg Hx   . Stomach cancer Neg Hx   . Rectal cancer Neg Hx   . Esophageal cancer Neg Hx     ROS- All systems are reviewed and negative except as per the HPI above  Physical Exam: Vitals:   02/14/19 1342  BP: 118/88  Pulse: (!) 103  Weight: 81.5 kg  Height: 5\' 3"  (1.6 m)   Wt Readings from Last 3 Encounters:  02/14/19 81.5 kg  02/13/19 81.6 kg  02/12/19 82.2 kg    Labs: Lab Results  Component Value Date   NA 135 01/22/2019   K 4.7 01/22/2019   CL 101 01/22/2019   CO2 20 01/22/2019   GLUCOSE 145 (H) 01/22/2019   BUN 26 01/22/2019   CREATININE 1.19 (H) 01/22/2019   CALCIUM 9.6 01/22/2019   PHOS 2.3 (L) 01/05/2018   MG 1.9 01/22/2019    Lab Results  Component Value Date   INR 1.38 04/14/2018   Lab Results  Component Value Date   CHOL 118 10/18/2018   HDL 38 (L) 10/18/2018   LDLCALC 50 10/18/2018   TRIG 152 (H) 10/18/2018     GEN- The patient is well appearing, alert and oriented x 3 today.   Head- normocephalic, atraumatic Eyes-  Sclera clear, conjunctiva pink Ears- hearing intact Oropharynx- clear Neck- supple, no JVP Lymph- no cervical lymphadenopathy Lungs- Clear to ausculation bilaterally, normal work of breathing Heart-   irregular rate and rhythm, no murmurs, rubs or gallops, PMI not laterally displaced GI- soft, NT, ND, + BS Extremities- no clubbing, cyanosis, or edema MS- no significant deformity or atrophy Skin- no rash or lesion Psych- euthymic mood, full affect Neuro- strength and sensation are intact  EKG-afib at 103 bpm, qrs int 90 ms, qtc 492 ms Epic records reviewed   Assessment and Plan: 1. Persistent  afib Failed sotalol and has maintained SR on amiodarone since January until now I will reload amiodarone at 200 mg bid and schedule for repeat cardioversion 10/12 Continue on diltiazem 120 mg daily  Increase lasix to 20 mg 1 1/2 tabs x 3 days for shortness of breath and a few lbs weight gain Bmet/mag today   2.  CHA2DS2VASc of 5 States no missed does of eliquis for at least 3 weeks Continue eliquis 5 mg bid   F/u here one week from cardioversion  Butch Penny C. Carletta Feasel, [facility] [address] [place], [place] [address] [phone]

## 2019-02-14 NOTE — H&P (View-Only) (Signed)
Primary Care Physician: Rutherford Guys, MD Referring Physician:[facility] f/u EP: Dr. Rayann Heman Cardiologist: Dr. Laurell Josephs is a 68 y.o. 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  She was admitted 04/14/18 with osteomyelitis of the Rt foot. Admission EKG showed atrial flutter with 2-1 conduction. She was seen by the Hospitalist service and placed on Diltiazem with improvement in her rate but she remained in atrial flutter (Cardiology was not consulted). On 04/15/18 she had Rt transtibial amputation. Post op course complicated by GI bleeding and anemia requiring transfusion. Endoscopy 04/16/18 showed esophagitis. She also had acute diastolic CHF and had to be diuresed. Eliquis was resumed, according to the records she remained in AF and was transferred to rehab. She was discharged 05/02/18.  She came to the ER  via private vehicle, reported to family not feeling well, nauseated, and needed to go to the ER, HPI was limited with some ongoing lethargy.  Reportedly en-route she had what appeared to her sister a seizure, upon arrival to the ER she was noted on telemetry to have AFib w/RVR, episodes of NSVT vs abbarency, cardiology consulted.  She had received amiodarone IV and continued  on po amiodarone.and metoprolol in the ER that blunted v rates, her sotalol stopped, though no appreciation of QT prolongation.  Dr. Harrell Gave worried these were VT given she had not historically had BBB with her rapid AF, and perhaps syncope (vs seizure).Dr. Lovena Le saw pt in consult and felt it was less likely to be v tach.   She had cardioversion 1/13 which was successful but unfortunately today is back in  afib with RVR, from which she is asymptomatic. She continued to load on amiodarone 200 mg bid and  eventually in January was successfully cardioverted.  She is now back in afib clinic,02/15/19. She was found to be back in afib yesterday at [facility] and referred here. She has been c/o of increased shortness of breath for several weeks but did not think  she was in afib, as previously  it was associated with chest discomfort. She feels her weight may be up a few pounds.   Today, she denies symptoms of palpitations, chest pain, shortness of breath, orthopnea, PND, lower extremity edema, dizziness, presyncope, syncope, or neurologic sequela. The patient is tolerating medications without difficulties and is otherwise without complaint today.   Past Medical History:  Diagnosis Date  . Abnormal EKG 07/31/2013  . Arthritis    "hands" (03/06/2015)  . Asthma   . Carpal tunnel syndrome, bilateral   . Chronic kidney disease   . Complication of anesthesia    slow to wake up. Pt sts she woke up during surgery many years ago.  . Coronary artery disease     2 v CAD with CTO of the RCA and high grade bifurcational LCx/OM stenosis. S/P PCI DES x 2 to the LCx/OM.  Marland Kitchen Diabetic peripheral neuropathy ([place]) "since 1996"  . GERD (gastroesophageal reflux disease)   . Goiter   . Headache    migraines prior to menopause  . History of shingles 06/01/2013  . Hyperlipidemia LDL goal <70 10/13/2015  . Hypertension   . Hyperthyroidism   . Osteomyelitis of foot (HCC)    Right  . PAF (paroxysmal atrial fibrillation) ([place]) 04/29/2015  CHADS2VASC score of 5 now on Apixaban  . Pneumonia ~ 1976  . Sleep apnea    Bipap  . Tremors of nervous system   . Type II diabetes mellitus (HCC)    insulin dependent   Past Surgical History:  Procedure Laterality Date  . ABDOMINAL HYSTERECTOMY  1988   age 65; CERVICAL DYSPLASIA; ovaries intact.   . AMPUTATION Right 01/23/2016   Procedure: Right 3rd Ray Amputation;  Surgeon: [name], MD;  Location: [place];  Service: Orthopedics;  Laterality: Right;  . AMPUTATION Right  02/13/2016   Procedure: Right Transmetatarsal Amputation;  Surgeon: [name], MD;  Location: [place];  Service: Orthopedics;  Laterality: Right;  . AMPUTATION Right 04/15/2018   Procedure: AMPUTATION BELOW KNEE;  Surgeon: [name], MD;  Location: [place];  Service: Orthopedics;  Laterality: Right;  . BIOPSY  04/16/2018   Procedure: BIOPSY;  Surgeon: Irene Shipper, MD;  Location: [facility] ENDOSCOPY;  Service: Endoscopy;;  . CARDIAC CATHETERIZATION N/A 02/27/2015   Procedure: Left Heart Cath and Coronary Angiography;  Surgeon: [name], MD; LAD 40%, mCFX 80%, OM 70%, RCA 100% calcified       . CARDIAC CATHETERIZATION N/A 03/06/2015   Procedure: Coronary Stent Intervention;  Surgeon: [name], MD;  Location: [place] CV LAB;  Service: Cardiovascular;  Laterality: N/A;  Mid CX 3.50x12 promus DES w/ 0% resdual and Prox OM1 2.50x20 promus DES w/ 20% residual  . CARDIOVERSION    . CARDIOVERSION N/A 05/22/2018   Procedure: CARDIOVERSION;  Surgeon: Skeet Latch, MD;  Location: [place];  Service: Cardiovascular;  Laterality: N/A;  . CARPAL TUNNEL RELEASE Right Nov 2015  . CARPAL TUNNEL RELEASE Right 1992; 05/2014   [name]; [place]  . CESAREAN SECTION  1982; 1984  . ESOPHAGOGASTRODUODENOSCOPY (EGD) WITH PROPOFOL N/A 04/16/2018   Procedure: ESOPHAGOGASTRODUODENOSCOPY (EGD) WITH PROPOFOL;  Surgeon: Irene Shipper, MD;  Location: [facility] ENDOSCOPY;  Service: Endoscopy;  Laterality: N/A;  . FOOT NEUROMA SURGERY Bilateral 2000  . I&D EXTREMITY Left 01/06/2018   Procedure: DEBRIDEMENT ULCER LEFT FOOT;  Surgeon: [name], MD;  Location: [place];  Service: Orthopedics;  Laterality: Left;  . KNEE ARTHROSCOPY Right ~ 2003   "meniscus repair"  . LIPOMA EXCISION Right 2020   thigh  . SHOULDER OPEN ROTATOR CUFF REPAIR Right 1996; 1998   "w/fracture repair"  . THYROID SURGERY  2000   "removed lots of nodules"  . TONSILLECTOMY  1976    Current Outpatient Medications  Medication Sig  Dispense Refill  . albuterol (PROVENTIL HFA) 108 (90 Base) MCG/ACT inhaler Inhale 1-2 puffs into the lungs every 6 (six) hours as needed for wheezing or shortness of breath. 1 Inhaler 0  . amiodarone (PACERONE) 200 MG tablet Take 1 tablet (200 mg total) by mouth daily. (Patient taking differently: Take 200 mg by mouth See admin instructions. Take 1 tablet (200 mg) by mouth scheduled every morning, may repeat dose if needed for afib) 30 tablet 6  . atorvastatin (LIPITOR) 20 MG tablet Take 1 tablet (20 mg total) by mouth daily. 90 tablet 3  . buPROPion (WELLBUTRIN) 75 MG tablet Take 75 mg by mouth 2 (two) times daily.    . cetirizine (ZYRTEC) 10 MG tablet Take 10 mg by mouth daily.    . cholestyramine (QUESTRAN) 4 g packet Take 0.5 packets (2 g total) by mouth at bedtime. 60 each 0  . diltiazem (CARTIA XT) 120 MG 24 hr capsule Take 1 capsule (120  mg total) by mouth daily. 90 capsule 3  . ELIQUIS 5 MG TABS tablet Take 1 tablet by mouth twice daily 180 tablet 1  . famotidine (PEPCID) 20 MG tablet Take 1 tablet (20 mg total) by mouth 2 (two) times daily. 60 tablet 3  . FLUoxetine (PROZAC) 20 MG capsule TAKE 1 CAPSULE AT BEDTIME 90 capsule 0  . fluticasone (FLOVENT HFA) 44 MCG/ACT inhaler Inhale 2 puffs into the lungs 2 (two) times daily. 1 Inhaler 3  . furosemide (LASIX) 40 MG tablet Take 1 tablet (40 mg total) by mouth daily. 30 tablet 2  . hydrALAZINE (APRESOLINE) 50 MG tablet Take 1 tablet (50 mg total) by mouth 3 (three) times daily. 270 tablet 3  . insulin degludec (TRESIBA FLEXTOUCH) 100 UNIT/ML SOPN FlexTouch Pen Inject 20 Units into the skin at bedtime.     Marland Kitchen loratadine (CLARITIN) 10 MG tablet Take 10 mg by mouth at bedtime.    . magnesium oxide (MAG-OX) 400 MG tablet Take 400 mg by mouth at bedtime.     . Melatonin 10 MG TABS Take 10 mg by mouth at bedtime.     . metFORMIN (GLUCOPHAGE-XR) 500 MG 24 hr tablet Take 1,000 mg by mouth daily with breakfast.    . methimazole (TAPAZOLE) 10 MG tablet  Take 1.5 tablets (15 mg total) by mouth daily. (Patient taking differently: Take 10 mg by mouth daily. ) 135 tablet 1  . metoprolol succinate (TOPROL-XL) 25 MG 24 hr tablet Take 25 mg by mouth 2 (two) times daily.    . mupirocin ointment (BACTROBAN) 2 % Apply 1 application topically 2 (two) times daily as needed (wound/sores).     . nitroGLYCERIN (NITROSTAT) 0.4 MG SL tablet Place 0.4 mg under the tongue every 5 (five) minutes x 3 doses as needed for chest pain.     . polyethylene glycol powder (GLYCOLAX/MIRALAX) 17 GM/SCOOP powder Take 17 g by mouth daily as needed (constipation.).     Marland Kitchen potassium chloride (K-DUR) 10 MEQ tablet Take 1 tablet (10 mEq total) by mouth 2 (two) times daily. 60 tablet 1  . Respiratory Therapy Supplies DEVI BI-PAP @@ Bedtime    . triamcinolone (KENALOG) 0.1 % paste Use as directed 1 application in the mouth or throat 2 (two) times daily as needed (ulcer lesion.). 5 g 0  . TRUE METRIX BLOOD GLUCOSE TEST test strip TEST BLOOD SUGAR TWICE DAILY 100 each 3   Current Facility-Administered Medications  Medication Dose Route Frequency Provider Last Rate Last Dose  . 0.9 %  sodium chloride infusion  500 mL Intravenous Once Nandigam, Venia Minks, MD        Allergies  Allergen Reactions  . Contrast Media [Iodinated Diagnostic Agents] Hives and Other (See Comments)    Spoke to patient, Iodine allergy is really IV contrast allergy.   [name] [Insulin Aspart] Shortness Of Breath and Other (See Comments)    "breathing problems"  . Codeine Nausea And Vomiting and Other (See Comments)    HIGH DOSES-SEVERE VOMITING  . Iodine Other (See Comments)    MUST HAVE BENADRYL PRIOR TO PROCEDURE AND RIGHT BEFORE TREATMENT TO COUNTERACT REACTION-BLISTERING REACTION DERMATOLOGICAL  . Penicillins Itching, Rash and Other (See Comments)    Has patient had a PCN reaction causing immediate rash, facial/tongue/throat swelling, SOB or lightheadedness with hypotension: no Has patient had a PCN  reaction causing severe rash involving mucus membranes or skin necrosis: No Has patient had a PCN reaction that required hospitalization No Has patient  had a PCN reaction occurring within the last 10 years: No If all of the above answers are "NO", then may proceed with Cephalosporin use.  CHEST SIZED RASH AND ITCHING   . Ace Inhibitors Cough  . Demerol [Meperidine] Nausea And Vomiting  . Dilaudid [Hydromorphone Hcl] Other (See Comments)    HEADACHE   . Neosporin [Neomycin-Bacitracin Zn-Polymyx] Itching, Rash and Other (See Comments)    MAKES REACTIONS WORSE WHEN USING AS PROPHYLACTIC  . Percocet [Oxycodone-Acetaminophen] Rash  . Tape Itching and Rash    Social History   Socioeconomic History  . Marital status: Single    Spouse name: Not on file  . Number of children: 2  . Years of education: Masters  . Highest education level: Not on file  Occupational History  . Occupation: Landscape architect  Social Needs  . Financial resource strain: Somewhat hard  . Food insecurity    Worry: Sometimes true    Inability: Sometimes true  . Transportation needs    Medical: No    Non-medical: No  Tobacco Use  . Smoking status: Former Smoker    Packs/day: 0.00    Years: 41.00    Pack years: 0.00    Types: Cigarettes    Quit date: 03/05/2015    Years since quitting: 3.9  . Smokeless tobacco: Never Used  . Tobacco comment: 04/29/2015 "quit smoking cigarettes 02/27/2015"  Substance and Sexual Activity  . Alcohol use: No  . Drug use: No  . Sexual activity: Not Currently    Birth control/protection: Post-menopausal, Surgical  Lifestyle  . Physical activity    Days per week: 0 days    Minutes per session: 0 min  . Stress: Very much  Relationships  . Social connections    Talks on phone: More than three times a week    Gets together: More than three times a week    Attends religious service: More than 4 times per year    Active member of club or organization: No    Attends  meetings of clubs or organizations: Never    Relationship status: Never married  . Intimate partner violence    Fear of current or ex partner: No    Emotionally abused: No    Physically abused: No    Forced sexual activity: No  Other Topics Concern  . 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      Lives with sister in [place].      Tobacco: 1 ppd x 41 years - quit 2016      Alcohol: none      Drugs: none      Exercise:  Walking in neighborhood; physical job.   Right-handed.   2 cups caffeine daily.    Family History  Problem Relation Age of Onset  . Cancer Mother 40       bronchial cancer  . Breast cancer Mother   . Lung cancer Mother   . Hypertension Father   . COPD Father   . Heart disease Father 68       CAD with cardiac stenting  . Heart attack Father   . Parkinson's disease Father   . Allergies Sister   . Breast cancer Maternal Grandmother   . Emphysema  Maternal Grandfather   . Leukemia Paternal Grandmother   . Emphysema Paternal Grandfather   . Thyroid disease Neg Hx   . Colon cancer Neg Hx   . Stomach cancer Neg Hx   . Rectal cancer Neg Hx   . Esophageal cancer Neg Hx     ROS- All systems are reviewed and negative except as per the HPI above  Physical Exam: Vitals:   02/14/19 1342  BP: 118/88  Pulse: (!) 103  Weight: 81.5 kg  Height: 5\' 3"  (1.6 m)   Wt Readings from Last 3 Encounters:  02/14/19 81.5 kg  02/13/19 81.6 kg  02/12/19 82.2 kg    Labs: Lab Results  Component Value Date   NA 135 01/22/2019   K 4.7 01/22/2019   CL 101 01/22/2019   CO2 20 01/22/2019   GLUCOSE 145 (H) 01/22/2019   BUN 26 01/22/2019   CREATININE 1.19 (H) 01/22/2019   CALCIUM 9.6 01/22/2019   PHOS 2.3 (L) 01/05/2018   MG 1.9 01/22/2019    Lab Results  Component Value Date   INR 1.38 04/14/2018   Lab Results  Component Value Date   CHOL 118 10/18/2018   HDL 38 (L) 10/18/2018   LDLCALC 50 10/18/2018   TRIG 152 (H) 10/18/2018     GEN- The patient is well appearing, alert and oriented x 3 today.   Head- normocephalic, atraumatic Eyes-  Sclera clear, conjunctiva pink Ears- hearing intact Oropharynx- clear Neck- supple, no JVP Lymph- no cervical lymphadenopathy Lungs- Clear to ausculation bilaterally, normal work of breathing Heart-   irregular rate and rhythm, no murmurs, rubs or gallops, PMI not laterally displaced GI- soft, NT, ND, + BS Extremities- no clubbing, cyanosis, or edema MS- no significant deformity or atrophy Skin- no rash or lesion Psych- euthymic mood, full affect Neuro- strength and sensation are intact  EKG-afib at 103 bpm, qrs int 90 ms, qtc 492 ms Epic records reviewed   Assessment and Plan: 1. Persistent  afib Failed sotalol and has maintained SR on amiodarone since January until now I will reload amiodarone at 200 mg bid and schedule for repeat cardioversion 10/12 Continue on diltiazem 120 mg daily  Increase lasix to 20 mg 1 1/2 tabs x 3 days for shortness of breath and a few lbs weight gain Bmet/mag today   2.  CHA2DS2VASc of 5 States no missed does of eliquis for at least 3 weeks Continue eliquis 5 mg bid   F/u here one week from cardioversion  Butch Penny C. Carroll, [facility] [address] [place], [place] [address] [phone]

## 2019-02-14 NOTE — Telephone Encounter (Signed)
Company: [facility] [employer] [facility]  Document: Insurance account manager for [employer] Other records requested: Dr. Loanne Drilling DID NOT sign "In Person visit with DPM" as he was not present during the time DPM completed physical exam.  All above requested information has been faxed successfully to the Company listed above. Documents and fax confirmation have been placed in the faxed file for future reference.

## 2019-02-14 NOTE — Patient Instructions (Signed)
Cardioversion scheduled for Monday, October 12th  - Arrive at the [facility] and go to admitting at [facility] not eat or drink anything after midnight the night prior to your procedure.  - Take all your morning medication with a sip of water prior to arrival.  - You will not be able to drive home after your procedure.  Increase amiodarone to 200mg  to twice a day until follow up  Increase lasix to 1 and 1/2 tablets a day for the next 3 days then return to normal dosing

## 2019-02-15 ENCOUNTER — Other Ambulatory Visit (HOSPITAL_COMMUNITY)
Admission: RE | Admit: 2019-02-15 | Discharge: 2019-02-15 | Disposition: A | Discharge status: Home / Self Care | Payer: Medicare PPO | Source: Ambulatory Visit | Attending: Cardiovascular Disease | Admitting: Cardiovascular Disease

## 2019-02-15 DIAGNOSIS — Z01812 Encounter for preprocedural laboratory examination: Secondary | ICD-10-CM | POA: Insufficient documentation

## 2019-02-15 DIAGNOSIS — Z20828 Contact with and (suspected) exposure to other viral communicable diseases: Secondary | ICD-10-CM | POA: Insufficient documentation

## 2019-02-16 LAB — NOVEL CORONAVIRUS, NAA (HOSP ORDER, SEND-OUT TO REF LAB; TAT 18-24 HRS): SARS-CoV-2, NAA: NOT DETECTED

## 2019-02-16 NOTE — Progress Notes (Signed)
Attempted pre call for procedure on Monday 10/12, no answer and automated voicemail.

## 2019-02-18 ENCOUNTER — Ambulatory Visit (HOSPITAL_COMMUNITY): Payer: Medicare PPO | Admitting: Certified Registered Nurse Anesthetist

## 2019-02-19 ENCOUNTER — Encounter (HOSPITAL_COMMUNITY)
Admission: RE | Disposition: A | Discharge status: Home / Self Care | Payer: Self-pay | Source: Home / Self Care | Attending: Cardiology

## 2019-02-19 ENCOUNTER — Ambulatory Visit (HOSPITAL_COMMUNITY)
Admission: RE | Admit: 2019-02-19 | Discharge: 2019-02-19 | Disposition: A | Discharge status: Home / Self Care | Payer: Medicare PPO | Attending: Cardiology | Admitting: Cardiology

## 2019-02-19 DIAGNOSIS — E1142 Type 2 diabetes mellitus with diabetic polyneuropathy: Secondary | ICD-10-CM | POA: Insufficient documentation

## 2019-02-19 DIAGNOSIS — E785 Hyperlipidemia, unspecified: Secondary | ICD-10-CM | POA: Diagnosis not present

## 2019-02-19 DIAGNOSIS — E0590 Thyrotoxicosis, unspecified without thyrotoxic crisis or storm: Secondary | ICD-10-CM | POA: Insufficient documentation

## 2019-02-19 DIAGNOSIS — Z89421 Acquired absence of other right toe(s): Secondary | ICD-10-CM | POA: Insufficient documentation

## 2019-02-19 DIAGNOSIS — Z87891 Personal history of nicotine dependence: Secondary | ICD-10-CM | POA: Diagnosis not present

## 2019-02-19 DIAGNOSIS — Z89511 Acquired absence of right leg below knee: Secondary | ICD-10-CM | POA: Diagnosis not present

## 2019-02-19 DIAGNOSIS — Z5309 Procedure and treatment not carried out because of other contraindication: Secondary | ICD-10-CM | POA: Diagnosis not present

## 2019-02-19 DIAGNOSIS — I2510 Atherosclerotic heart disease of native coronary artery without angina pectoris: Secondary | ICD-10-CM | POA: Insufficient documentation

## 2019-02-19 DIAGNOSIS — I129 Hypertensive chronic kidney disease with stage 1 through stage 4 chronic kidney disease, or unspecified chronic kidney disease: Secondary | ICD-10-CM | POA: Insufficient documentation

## 2019-02-19 DIAGNOSIS — G4733 Obstructive sleep apnea (adult) (pediatric): Secondary | ICD-10-CM | POA: Diagnosis not present

## 2019-02-19 DIAGNOSIS — K219 Gastro-esophageal reflux disease without esophagitis: Secondary | ICD-10-CM | POA: Diagnosis not present

## 2019-02-19 DIAGNOSIS — Z794 Long term (current) use of insulin: Secondary | ICD-10-CM | POA: Insufficient documentation

## 2019-02-19 DIAGNOSIS — I4819 Other persistent atrial fibrillation: Secondary | ICD-10-CM | POA: Insufficient documentation

## 2019-02-19 DIAGNOSIS — E1151 Type 2 diabetes mellitus with diabetic peripheral angiopathy without gangrene: Secondary | ICD-10-CM | POA: Insufficient documentation

## 2019-02-19 DIAGNOSIS — E1122 Type 2 diabetes mellitus with diabetic chronic kidney disease: Secondary | ICD-10-CM | POA: Diagnosis not present

## 2019-02-19 DIAGNOSIS — Z955 Presence of coronary angioplasty implant and graft: Secondary | ICD-10-CM | POA: Insufficient documentation

## 2019-02-19 DIAGNOSIS — N189 Chronic kidney disease, unspecified: Secondary | ICD-10-CM | POA: Insufficient documentation

## 2019-02-19 SURGERY — CANCELLED PROCEDURE
Anesthesia: General

## 2019-02-19 NOTE — Anesthesia Preprocedure Evaluation (Deleted)
Anesthesia Evaluation  Patient identified by MRN, date of birth, ID band Patient awake    Reviewed: Allergy & Precautions, NPO status , Patient's Chart, lab work & pertinent test results  Airway Mallampati: II  TM Distance: >3 FB Neck ROM: Full    Dental no notable dental hx.    Pulmonary asthma , sleep apnea , former smoker,    Pulmonary exam normal breath sounds clear to auscultation       Cardiovascular hypertension, + CAD and + Cardiac Stents  + dysrhythmias Atrial Fibrillation  Rhythm:Irregular Rate:Normal     Neuro/Psych negative neurological ROS  negative psych ROS   GI/Hepatic Neg liver ROS, GERD  ,  Endo/Other  diabetesHyperthyroidism   Renal/GU negative Renal ROS  negative genitourinary   Musculoskeletal negative musculoskeletal ROS (+)   Abdominal   Peds negative pediatric ROS (+)  Hematology  (+) anemia ,   Anesthesia Other Findings   Reproductive/Obstetrics negative OB ROS                             Anesthesia Physical Anesthesia Plan  ASA: III  Anesthesia Plan: General   Post-op Pain Management:    Induction: Intravenous  PONV Risk Score and Plan: 0  Airway Management Planned: Mask  Additional Equipment:   Intra-op Plan:   Post-operative Plan:   Informed Consent: I have reviewed the patients History and Physical, chart, labs and discussed the procedure including the risks, benefits and alternatives for the proposed anesthesia with the patient or authorized representative who has indicated his/her understanding and acceptance.     Dental advisory given  Plan Discussed with: CRNA and Surgeon  Anesthesia Plan Comments:         Anesthesia Quick Evaluation

## 2019-02-19 NOTE — Progress Notes (Signed)
Pt states she has not taken Eliquis in the last few days. MD Meda Coffee made aware.

## 2019-02-19 NOTE — Interval H&P Note (Signed)
History and Physical Interval Note:  02/19/2019 9:56 AM  Judith [name]  has presented today for surgery, with the diagnosis of A-FIB.  The various methods of treatment have been discussed with the patient and family. After consideration of risks, benefits and other options for treatment, the patient has consented to  Procedure(s): CARDIOVERSION (N/A) as a surgical intervention.  The patient's history has been reviewed, patient examined, no change in status, stable for surgery.  I have reviewed the patient's chart and labs.  Questions were answered to the patient's satisfaction.     Ena Dawley

## 2019-02-19 NOTE — CV Procedure (Signed)
The procedure was cancelled as the patient didn't take Eliquis in the last 3 days. It will be rescheduled in 3 weeks. If more urgent procedure is necessary it will have to be a TEE/DCCV.  Ena Dawley, MD

## 2019-02-21 ENCOUNTER — Ambulatory Visit (INDEPENDENT_AMBULATORY_CARE_PROVIDER_SITE_OTHER): Payer: Medicare PPO | Admitting: Registered Nurse

## 2019-02-21 ENCOUNTER — Other Ambulatory Visit: Payer: Self-pay

## 2019-02-21 ENCOUNTER — Encounter: Payer: Self-pay | Admitting: Registered Nurse

## 2019-02-21 VITALS — BP 109/73 | HR 73 | Temp 97.8°F | Resp 16 | Wt 188.0 lb

## 2019-02-21 DIAGNOSIS — R21 Rash and other nonspecific skin eruption: Secondary | ICD-10-CM | POA: Diagnosis not present

## 2019-02-21 DIAGNOSIS — B356 Tinea cruris: Secondary | ICD-10-CM | POA: Diagnosis not present

## 2019-02-21 MED ORDER — KETOCONAZOLE 2 % EX CREA: 1.0000 "application " | TOPICAL_CREAM | Freq: Every day | CUTANEOUS | 2 refills | Status: AC

## 2019-02-21 MED ORDER — PREDNISONE 10 MG PO TABS: ORAL_TABLET | ORAL | 0 refills | Status: DC

## 2019-02-21 NOTE — Progress Notes (Signed)
Established Patient Office Visit  Subjective:  Patient ID: Judith [name], female    DOB: 1950-05-17  Age: 68 y.o. MRN: [id number]  CC:  Chief Complaint  Patient presents with  . Rash    pt states she noticed the rash the x 3 days. Area is red,itchy, and painful. Vaginal area     HPI [name] presents for rash  Rash onset around 3 days prior. Notes that it is affecting her L eye, R elbow, and groin. She notes that it is upper thigh and perineal area rather than genital involvement. She states it is itchy, puffy, and for the rash in her groin, mildly productive.  She has not had a rash like this before. She denies any known exposure to allergens or irritants.   Past Medical History:  Diagnosis Date  . Abnormal EKG 07/31/2013  . Arthritis    "hands" (03/06/2015)  . Asthma   . Carpal tunnel syndrome, bilateral   . Chronic kidney disease   . Complication of anesthesia    slow to wake up. Pt sts she woke up during surgery many years ago.  . Coronary artery disease     2 v CAD with CTO of the RCA and high grade bifurcational LCx/OM stenosis. S/P PCI DES x 2 to the LCx/OM.  Marland Kitchen Diabetic peripheral neuropathy ([place]) "since 1996"  . GERD (gastroesophageal reflux disease)   . Goiter   . Headache    migraines prior to menopause  . History of shingles 06/01/2013  . Hyperlipidemia LDL goal <70 10/13/2015  . Hypertension   . Hyperthyroidism   . Osteomyelitis of foot (HCC)    Right  . PAF (paroxysmal atrial fibrillation) ([place]) 04/29/2015   CHADS2VASC score of 5 now on Apixaban  . Pneumonia ~ 1976  . Sleep apnea    Bipap  . Tremors of nervous system   . Type II diabetes mellitus (HCC)    insulin dependent    Past Surgical History:  Procedure Laterality Date  . ABDOMINAL HYSTERECTOMY  1988   age 49; CERVICAL DYSPLASIA; ovaries intact.   . AMPUTATION Right 01/23/2016   Procedure: Right 3rd Ray Amputation;  Surgeon: [name], MD;  Location: [facility];  Service: Orthopedics;   Laterality: Right;  . AMPUTATION Right 02/13/2016   Procedure: Right Transmetatarsal Amputation;  Surgeon: [name], MD;  Location: [place];  Service: Orthopedics;  Laterality: Right;  . AMPUTATION Right 04/15/2018   Procedure: AMPUTATION BELOW KNEE;  Surgeon: [name], MD;  Location: [place];  Service: Orthopedics;  Laterality: Right;  . BIOPSY  04/16/2018   Procedure: BIOPSY;  Surgeon: Irene Shipper, MD;  Location: [facility] ENDOSCOPY;  Service: Endoscopy;;  . CARDIAC CATHETERIZATION N/A 02/27/2015   Procedure: Left Heart Cath and Coronary Angiography;  Surgeon: [name], MD; LAD 40%, mCFX 80%, OM 70%, RCA 100% calcified       . CARDIAC CATHETERIZATION N/A 03/06/2015   Procedure: Coronary Stent Intervention;  Surgeon: [name], MD;  Location: [place] [facility];  Service: Cardiovascular;  Laterality: N/A;  Mid CX 3.50x12 promus DES w/ 0% resdual and Prox OM1 2.50x20 promus DES w/ 20% residual  . CARDIOVERSION    . CARDIOVERSION N/A 05/22/2018   Procedure: CARDIOVERSION;  Surgeon: Skeet Latch, MD;  Location: [place];  Service: Cardiovascular;  Laterality: N/A;  . CARPAL TUNNEL RELEASE Right Nov 2015  . CARPAL TUNNEL RELEASE Right 1992; 05/2014   [name]; [place]  . CESAREAN SECTION  1982; 1984  . ESOPHAGOGASTRODUODENOSCOPY (EGD) WITH PROPOFOL N/A 04/16/2018   Procedure: ESOPHAGOGASTRODUODENOSCOPY (EGD) WITH PROPOFOL;  Surgeon: Irene Shipper, MD;  Location: [facility] ENDOSCOPY;  Service: Endoscopy;  Laterality: N/A;  . FOOT NEUROMA SURGERY Bilateral 2000  . I&D EXTREMITY Left 01/06/2018   Procedure: DEBRIDEMENT ULCER LEFT FOOT;  Surgeon: [name], MD;  Location: [place];  Service: Orthopedics;  Laterality: Left;  . KNEE ARTHROSCOPY Right ~ 2003   "meniscus repair"  . LIPOMA EXCISION Right 2020   thigh  . SHOULDER OPEN ROTATOR CUFF REPAIR Right 1996; 1998   "w/fracture repair"  . THYROID SURGERY  2000   "removed lots of nodules"  . TONSILLECTOMY  1976    Family  History  Problem Relation Age of Onset  . Cancer Mother 108       bronchial cancer  . Breast cancer Mother   . Lung cancer Mother   . Hypertension Father   . COPD Father   . Heart disease Father 59       CAD with cardiac stenting  . Heart attack Father   . Parkinson's disease Father   . Allergies Sister   . Breast cancer Maternal Grandmother   . Emphysema Maternal Grandfather   . Leukemia Paternal Grandmother   . Emphysema Paternal Grandfather   . Thyroid disease Neg Hx   . Colon cancer Neg Hx   . Stomach cancer Neg Hx   . Rectal cancer Neg Hx   . Esophageal cancer Neg Hx     Social History   Socioeconomic History  . Marital status: Single    Spouse name: Not on file  . Number of children: 2  . Years of education: Masters  . Highest education level: Not on file  Occupational History  . Occupation: Landscape architect  Social Needs  . Financial resource strain: Somewhat hard  . Food insecurity    Worry: Sometimes true    Inability: Sometimes true  . Transportation needs    Medical: No    Non-medical: No  Tobacco Use  . Smoking status: Former Smoker    Packs/day: 0.00    Years: 41.00    Pack years: 0.00    Types: Cigarettes    Quit date: 03/05/2015    Years since quitting: 3.9  . Smokeless tobacco: Never Used  . Tobacco comment: 04/29/2015 "quit smoking cigarettes 02/27/2015"  Substance and Sexual Activity  . Alcohol use: No  . Drug use: No  . Sexual activity: Not Currently    Birth control/protection: Post-menopausal, Surgical  Lifestyle  . Physical activity    Days per week: 0 days    Minutes per session: 0 min  . Stress: Very much  Relationships  . Social connections    Talks on phone: More than three times a week    Gets together: More than three times a week    Attends religious service: More than 4 times per year    Active member of club or organization: No    Attends meetings of clubs or organizations: Never    Relationship status: Never  married  . Intimate partner violence    Fear of current or ex partner: No    Emotionally abused: No    Physically abused: No    Forced sexual activity: No  Other Topics Concern  . 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      Lives with sister in [place].      Tobacco: 1 ppd x 41 years - quit 2016      Alcohol: none      Drugs: none      Exercise:  Walking in neighborhood; physical job.   Right-handed.   2 cups caffeine daily.    Outpatient Medications Prior to Visit  Medication Sig Dispense Refill  . albuterol (PROVENTIL HFA) 108 (90 Base) MCG/ACT inhaler Inhale 1-2 puffs into the lungs every 6 (six) hours as needed for wheezing or shortness of breath. 1 Inhaler 0  . amiodarone (PACERONE) 200 MG tablet Take 1 tablet (200 mg total) by mouth daily. 30 tablet 6  . atorvastatin (LIPITOR) 20 MG tablet Take 1 tablet (20 mg total) by mouth daily. 90 tablet 3  . buPROPion (WELLBUTRIN) 75 MG tablet Take 75 mg by mouth 2 (two) times daily.    . cetirizine (ZYRTEC) 10 MG tablet Take 10 mg by mouth daily as needed for allergies.     . cholestyramine (QUESTRAN) 4 g packet Take 0.5 packets (2 g total) by mouth at bedtime. 60 each 0  . diltiazem (CARTIA XT) 120 MG 24 hr capsule Take 1 capsule (120 mg total) by mouth daily. 90 capsule 3  . ELIQUIS 5 MG TABS tablet Take 1 tablet by mouth twice daily (Patient taking differently: Take 5 mg by mouth 2 (two) times daily. ) 180 tablet 1  . famotidine (PEPCID) 20 MG tablet Take 1 tablet (20 mg total) by mouth 2 (two) times daily. (Patient taking differently: Take 40 mg by mouth daily. ) 60 tablet 3  . ferrous sulfate 325 (65 FE) MG tablet Take 325 mg by mouth daily as needed (Low iron).    Marland Kitchen FLUoxetine (PROZAC) 20 MG capsule  TAKE 1 CAPSULE AT BEDTIME (Patient taking differently: Take 20 mg by mouth at bedtime. ) 90 capsule 0  . fluticasone (FLOVENT HFA) 44 MCG/ACT inhaler Inhale 2 puffs into the lungs 2 (two) times daily. (Patient taking differently: Inhale 2 puffs into the lungs daily as needed (Allergies). ) 1 Inhaler 3  . furosemide (LASIX) 40 MG tablet Take 1 tablet (40 mg total) by mouth daily. 30 tablet 2  . hydrALAZINE (APRESOLINE) 50 MG tablet Take 1 tablet (50 mg total) by mouth 3 (three) times daily. 270 tablet 3  . insulin degludec (TRESIBA FLEXTOUCH) 100 UNIT/ML SOPN FlexTouch Pen Inject 20 Units into the skin at bedtime.     Marland Kitchen loratadine (CLARITIN) 10 MG tablet Take 10 mg by mouth at bedtime.    . magnesium oxide (MAG-OX) 400 MG tablet Take 400 mg by mouth at bedtime.     . Melatonin 10 MG TABS Take 10 mg by mouth at bedtime as needed (Sleep).     . metFORMIN (GLUCOPHAGE-XR) 500 MG 24 hr tablet Take 1,000 mg by mouth daily with breakfast.    . methimazole (TAPAZOLE) 10 MG tablet Take 1.5 tablets (15 mg total) by mouth daily. (Patient taking differently: Take 10 mg by mouth daily. ) 135 tablet 1  . metoprolol succinate (TOPROL-XL) 25 MG 24 hr tablet Take 50 mg by mouth 2 (two) times daily.     . mupirocin ointment (BACTROBAN) 2 % Apply 1 application topically 2 (two) times daily as needed (wound/sores).     . nitroGLYCERIN (NITROSTAT)  0.4 MG SL tablet Place 0.4 mg under the tongue every 5 (five) minutes x 3 doses as needed for chest pain.     . polyethylene glycol powder (GLYCOLAX/MIRALAX) 17 GM/SCOOP powder Take 17 g by mouth daily as needed (constipation.).     Marland Kitchen potassium chloride (K-DUR) 10 MEQ tablet Take 1 tablet (10 mEq total) by mouth 2 (two) times daily. (Patient taking differently: Take 10 mEq by mouth daily. ) 60 tablet 1  . Respiratory Therapy Supplies DEVI BI-PAP @@ Bedtime    . triamcinolone (KENALOG) 0.1 % paste Use as directed 1 application in the mouth or throat 2 (two) times daily as needed  (ulcer lesion.). 5 g 0  . TRUE METRIX BLOOD GLUCOSE TEST test strip TEST BLOOD SUGAR TWICE DAILY 100 each 3   Facility-Administered Medications Prior to Visit  Medication Dose Route Frequency Provider Last Rate Last Dose  . 0.9 %  sodium chloride infusion  500 mL Intravenous Once Nandigam, Venia Minks, MD        Allergies  Allergen Reactions  . Contrast Media [Iodinated Diagnostic Agents] Hives and Other (See Comments)    Spoke to patient, Iodine allergy is really IV contrast allergy.   [name] [Insulin Aspart] Shortness Of Breath and Other (See Comments)    "breathing problems"  . Codeine Nausea And Vomiting and Other (See Comments)    HIGH DOSES-SEVERE VOMITING  . Iodine Other (See Comments)    MUST HAVE BENADRYL PRIOR TO PROCEDURE AND RIGHT BEFORE TREATMENT TO COUNTERACT REACTION-BLISTERING REACTION DERMATOLOGICAL  . Penicillins Itching, Rash and Other (See Comments)    Has patient had a PCN reaction causing immediate rash, facial/tongue/throat swelling, SOB or lightheadedness with hypotension: no Has patient had a PCN reaction causing severe rash involving mucus membranes or skin necrosis: No Has patient had a PCN reaction that required hospitalization No Has patient had a PCN reaction occurring within the last 10 years: No If all of the above answers are "NO", then may proceed with Cephalosporin use.  CHEST SIZED RASH AND ITCHING   . Ace Inhibitors Cough  . Demerol [Meperidine] Nausea And Vomiting  . Dilaudid [Hydromorphone Hcl] Other (See Comments)    HEADACHE   . Neosporin [Neomycin-Bacitracin Zn-Polymyx] Itching, Rash and Other (See Comments)    MAKES REACTIONS WORSE WHEN USING AS PROPHYLACTIC  . Percocet [Oxycodone-Acetaminophen] Rash  . Tape Itching and Rash    ROS Review of Systems  Constitutional: Negative.   HENT: Negative.   Eyes: Negative.   Respiratory: Negative.   Cardiovascular: Negative.   Gastrointestinal: Negative.   Endocrine: Negative.    Genitourinary: Negative.  Negative for vaginal bleeding, vaginal discharge and vaginal pain.  Musculoskeletal: Negative.   Skin: Positive for rash (per hpi).  Allergic/Immunologic: Negative.   Neurological: Negative.   Hematological: Negative.   Psychiatric/Behavioral: Negative.   All other systems reviewed and are negative.     Objective:    Physical Exam  Constitutional: She is oriented to person, place, and time. She appears well-developed and well-nourished. No distress.  Cardiovascular: Normal rate and regular rhythm.  Pulmonary/Chest: Effort normal. No respiratory distress.  Genitourinary:    Genitourinary Comments: Pt declined exam   Neurological: She is alert and oriented to person, place, and time.  Skin: Skin is warm. Rash noted. She is not diaphoretic. There is erythema. No pallor.  Erythema and notable swelling around L eye. No discharge, no injection. Denies visual changes.   Psychiatric: She has a normal mood and affect. Her  behavior is normal. Judgment and thought content normal.  Nursing note and vitals reviewed.   BP 109/73   Pulse 73   Temp 97.8 F (36.6 C) (Oral)   Resp 16   Wt 188 lb (85.3 kg)   SpO2 93%   BMI 33.30 kg/m  Wt Readings from Last 3 Encounters:  02/21/19 188 lb (85.3 kg)  02/14/19 179 lb 9.6 oz (81.5 kg)  02/13/19 179 lb 12.8 oz (81.6 kg)     There are no preventive care reminders to display for this patient.  There are no preventive care reminders to display for this patient.  Lab Results  Component Value Date   TSH 0.11 (L) 02/13/2019   Lab Results  Component Value Date   WBC 6.3 02/14/2019   HGB 10.5 (L) 02/14/2019   HCT 33.4 (L) 02/14/2019   MCV 89.8 02/14/2019   PLT 312 02/14/2019   Lab Results  Component Value Date   NA 133 (L) 02/14/2019   K 4.3 02/14/2019   CO2 20 (L) 02/14/2019   GLUCOSE 208 (H) 02/14/2019   BUN 24 (H) 02/14/2019   CREATININE 1.27 (H) 02/14/2019   BILITOT 0.3 01/18/2019   ALKPHOS 120 (H)  01/18/2019   AST 18 01/18/2019   ALT 15 01/18/2019   PROT 6.5 01/22/2019   ALBUMIN 3.6 (L) 01/18/2019   CALCIUM 9.0 02/14/2019   ANIONGAP 13 02/14/2019   Lab Results  Component Value Date   CHOL 118 10/18/2018   Lab Results  Component Value Date   HDL 38 (L) 10/18/2018   Lab Results  Component Value Date   LDLCALC 50 10/18/2018   Lab Results  Component Value Date   TRIG 152 (H) 10/18/2018   Lab Results  Component Value Date   CHOLHDL 3.1 10/18/2018   Lab Results  Component Value Date   HGBA1C 7.3 (A) 02/13/2019      Assessment & Plan:   Problem List Items Addressed This Visit    None    Visit Diagnoses    Rash and nonspecific skin eruption    -  Primary   Relevant Medications   predniSONE (DELTASONE) 10 MG tablet   Tinea cruris       Relevant Medications   ketoconazole (NIZORAL) 2 % cream      Meds ordered this encounter  Medications  . predniSONE (DELTASONE) 10 MG tablet    Sig: Take 3 tablets (30 mg total) by mouth daily with breakfast for 2 days, THEN 2 tablets (20 mg total) daily with breakfast for 2 days, THEN 1 tablet (10 mg total) daily with breakfast for 3 days.    Dispense:  13 tablet    Refill:  0    Order Specific Question:   Supervising Provider    Answer:   Delia Chimes A [id number]  . ketoconazole (NIZORAL) 2 % cream    Sig: Apply 1 application topically daily.    Dispense:  15 g    Refill:  2    Order Specific Question:   Supervising Provider    Answer:   Forrest Moron [id number]    Follow-up: No follow-ups on file.   PLAN  Appears to be perhaps a viral or allergic etiology - will give prednisone 1 week taper.  Pt declines exam, but rash in groin would be more suggestive of tinea cruris based on history and symptoms given. Will give ketoconazole for this. Reviewed application instructions.   Return to clinic if rash worsens or fails to  improve  Patient encouraged to call clinic with any questions, comments, or concerns.    Maximiano Coss, NP

## 2019-02-21 NOTE — Patient Instructions (Signed)
° ° ° °  If you have lab work done today you will be contacted with your lab results within the next 2 weeks.  If you have not heard from us then please contact us. The fastest way to get your results is to register for My Chart. ° ° °IF you received an x-ray today, you will receive an invoice from [facility]. Please contact [facility] at [phone] with questions or concerns regarding your invoice.  ° °IF you received labwork today, you will receive an invoice from [facility]. Please contact [facility] at [phone] with questions or concerns regarding your invoice.  ° °Our billing staff will not be able to assist you with questions regarding bills from these companies. ° °You will be contacted with the lab results as soon as they are available. The fastest way to get your results is to activate your My Chart account. Instructions are located on the last page of this paperwork. If you have not heard from us regarding the results in 2 weeks, please contact this office. °  ° ° ° °

## 2019-02-23 ENCOUNTER — Inpatient Hospital Stay (HOSPITAL_COMMUNITY)
Admission: EM | Admit: 2019-02-23 | Discharge: 2019-03-11 | DRG: 308 | Disposition: E | Payer: Medicare PPO | Attending: Pulmonary Disease | Admitting: Pulmonary Disease

## 2019-02-23 ENCOUNTER — Encounter: Payer: Self-pay | Admitting: Adult Health Nurse Practitioner

## 2019-02-23 ENCOUNTER — Other Ambulatory Visit: Payer: Self-pay | Admitting: Endocrinology

## 2019-02-23 ENCOUNTER — Encounter (HOSPITAL_COMMUNITY): Payer: Self-pay

## 2019-02-23 ENCOUNTER — Ambulatory Visit (INDEPENDENT_AMBULATORY_CARE_PROVIDER_SITE_OTHER): Payer: Medicare PPO | Admitting: Adult Health Nurse Practitioner

## 2019-02-23 ENCOUNTER — Emergency Department (HOSPITAL_COMMUNITY): Payer: Medicare PPO

## 2019-02-23 ENCOUNTER — Other Ambulatory Visit: Payer: Self-pay

## 2019-02-23 ENCOUNTER — Telehealth: Payer: Self-pay | Admitting: Gastroenterology

## 2019-02-23 VITALS — BP 82/62 | HR 150 | Temp 98.4°F | Ht 63.0 in | Wt 189.2 lb

## 2019-02-23 DIAGNOSIS — T17908A Unspecified foreign body in respiratory tract, part unspecified causing other injury, initial encounter: Secondary | ICD-10-CM

## 2019-02-23 DIAGNOSIS — N1832 Chronic kidney disease, stage 3b: Secondary | ICD-10-CM | POA: Diagnosis not present

## 2019-02-23 DIAGNOSIS — Z880 Allergy status to penicillin: Secondary | ICD-10-CM

## 2019-02-23 DIAGNOSIS — Z806 Family history of leukemia: Secondary | ICD-10-CM

## 2019-02-23 DIAGNOSIS — Z4682 Encounter for fitting and adjustment of non-vascular catheter: Secondary | ICD-10-CM | POA: Diagnosis not present

## 2019-02-23 DIAGNOSIS — E871 Hypo-osmolality and hyponatremia: Secondary | ICD-10-CM | POA: Diagnosis not present

## 2019-02-23 DIAGNOSIS — E1169 Type 2 diabetes mellitus with other specified complication: Secondary | ICD-10-CM | POA: Diagnosis present

## 2019-02-23 DIAGNOSIS — I4819 Other persistent atrial fibrillation: Secondary | ICD-10-CM

## 2019-02-23 DIAGNOSIS — Z6832 Body mass index (BMI) 32.0-32.9, adult: Secondary | ICD-10-CM

## 2019-02-23 DIAGNOSIS — E872 Acidosis: Secondary | ICD-10-CM | POA: Diagnosis not present

## 2019-02-23 DIAGNOSIS — F329 Major depressive disorder, single episode, unspecified: Secondary | ICD-10-CM | POA: Diagnosis present

## 2019-02-23 DIAGNOSIS — E1151 Type 2 diabetes mellitus with diabetic peripheral angiopathy without gangrene: Secondary | ICD-10-CM | POA: Diagnosis not present

## 2019-02-23 DIAGNOSIS — R079 Chest pain, unspecified: Secondary | ICD-10-CM | POA: Diagnosis not present

## 2019-02-23 DIAGNOSIS — J95851 Ventilator associated pneumonia: Secondary | ICD-10-CM | POA: Diagnosis not present

## 2019-02-23 DIAGNOSIS — R0789 Other chest pain: Secondary | ICD-10-CM | POA: Diagnosis not present

## 2019-02-23 DIAGNOSIS — I130 Hypertensive heart and chronic kidney disease with heart failure and stage 1 through stage 4 chronic kidney disease, or unspecified chronic kidney disease: Secondary | ICD-10-CM | POA: Diagnosis not present

## 2019-02-23 DIAGNOSIS — N390 Urinary tract infection, site not specified: Secondary | ICD-10-CM | POA: Diagnosis not present

## 2019-02-23 DIAGNOSIS — Z8249 Family history of ischemic heart disease and other diseases of the circulatory system: Secondary | ICD-10-CM

## 2019-02-23 DIAGNOSIS — Z66 Do not resuscitate: Secondary | ICD-10-CM | POA: Diagnosis not present

## 2019-02-23 DIAGNOSIS — J9601 Acute respiratory failure with hypoxia: Secondary | ICD-10-CM | POA: Diagnosis present

## 2019-02-23 DIAGNOSIS — I739 Peripheral vascular disease, unspecified: Secondary | ICD-10-CM | POA: Diagnosis not present

## 2019-02-23 DIAGNOSIS — R945 Abnormal results of liver function studies: Secondary | ICD-10-CM

## 2019-02-23 DIAGNOSIS — K7200 Acute and subacute hepatic failure without coma: Secondary | ICD-10-CM | POA: Diagnosis not present

## 2019-02-23 DIAGNOSIS — E1165 Type 2 diabetes mellitus with hyperglycemia: Secondary | ICD-10-CM | POA: Diagnosis not present

## 2019-02-23 DIAGNOSIS — I248 Other forms of acute ischemic heart disease: Secondary | ICD-10-CM | POA: Diagnosis not present

## 2019-02-23 DIAGNOSIS — Z794 Long term (current) use of insulin: Secondary | ICD-10-CM

## 2019-02-23 DIAGNOSIS — R778 Other specified abnormalities of plasma proteins: Secondary | ICD-10-CM

## 2019-02-23 DIAGNOSIS — Z20828 Contact with and (suspected) exposure to other viral communicable diseases: Secondary | ICD-10-CM | POA: Diagnosis present

## 2019-02-23 DIAGNOSIS — I480 Paroxysmal atrial fibrillation: Secondary | ICD-10-CM | POA: Diagnosis not present

## 2019-02-23 DIAGNOSIS — Z9911 Dependence on respirator [ventilator] status: Secondary | ICD-10-CM

## 2019-02-23 DIAGNOSIS — Z87891 Personal history of nicotine dependence: Secondary | ICD-10-CM

## 2019-02-23 DIAGNOSIS — R7989 Other specified abnormal findings of blood chemistry: Secondary | ICD-10-CM

## 2019-02-23 DIAGNOSIS — I4891 Unspecified atrial fibrillation: Secondary | ICD-10-CM | POA: Diagnosis not present

## 2019-02-23 DIAGNOSIS — E1122 Type 2 diabetes mellitus with diabetic chronic kidney disease: Secondary | ICD-10-CM | POA: Diagnosis present

## 2019-02-23 DIAGNOSIS — Z01818 Encounter for other preprocedural examination: Secondary | ICD-10-CM

## 2019-02-23 DIAGNOSIS — R069 Unspecified abnormalities of breathing: Secondary | ICD-10-CM

## 2019-02-23 DIAGNOSIS — R918 Other nonspecific abnormal finding of lung field: Secondary | ICD-10-CM | POA: Diagnosis not present

## 2019-02-23 DIAGNOSIS — R7881 Bacteremia: Secondary | ICD-10-CM | POA: Diagnosis not present

## 2019-02-23 DIAGNOSIS — Z89511 Acquired absence of right leg below knee: Secondary | ICD-10-CM | POA: Diagnosis not present

## 2019-02-23 DIAGNOSIS — E669 Obesity, unspecified: Secondary | ICD-10-CM | POA: Diagnosis present

## 2019-02-23 DIAGNOSIS — I5021 Acute systolic (congestive) heart failure: Secondary | ICD-10-CM | POA: Diagnosis not present

## 2019-02-23 DIAGNOSIS — I10 Essential (primary) hypertension: Secondary | ICD-10-CM | POA: Diagnosis present

## 2019-02-23 DIAGNOSIS — J9600 Acute respiratory failure, unspecified whether with hypoxia or hypercapnia: Secondary | ICD-10-CM

## 2019-02-23 DIAGNOSIS — E785 Hyperlipidemia, unspecified: Secondary | ICD-10-CM | POA: Diagnosis present

## 2019-02-23 DIAGNOSIS — E0591 Thyrotoxicosis, unspecified with thyrotoxic crisis or storm: Secondary | ICD-10-CM | POA: Diagnosis not present

## 2019-02-23 DIAGNOSIS — R1110 Vomiting, unspecified: Secondary | ICD-10-CM | POA: Diagnosis not present

## 2019-02-23 DIAGNOSIS — M19041 Primary osteoarthritis, right hand: Secondary | ICD-10-CM | POA: Diagnosis present

## 2019-02-23 DIAGNOSIS — K828 Other specified diseases of gallbladder: Secondary | ICD-10-CM | POA: Diagnosis not present

## 2019-02-23 DIAGNOSIS — I484 Atypical atrial flutter: Secondary | ICD-10-CM | POA: Diagnosis not present

## 2019-02-23 DIAGNOSIS — Z452 Encounter for adjustment and management of vascular access device: Secondary | ICD-10-CM

## 2019-02-23 DIAGNOSIS — Z4659 Encounter for fitting and adjustment of other gastrointestinal appliance and device: Secondary | ICD-10-CM

## 2019-02-23 DIAGNOSIS — J15212 Pneumonia due to Methicillin resistant Staphylococcus aureus: Secondary | ICD-10-CM | POA: Diagnosis not present

## 2019-02-23 DIAGNOSIS — Z91041 Radiographic dye allergy status: Secondary | ICD-10-CM

## 2019-02-23 DIAGNOSIS — N179 Acute kidney failure, unspecified: Secondary | ICD-10-CM | POA: Diagnosis not present

## 2019-02-23 DIAGNOSIS — Z955 Presence of coronary angioplasty implant and graft: Secondary | ICD-10-CM

## 2019-02-23 DIAGNOSIS — Z820 Family history of epilepsy and other diseases of the nervous system: Secondary | ICD-10-CM

## 2019-02-23 DIAGNOSIS — E876 Hypokalemia: Secondary | ICD-10-CM | POA: Diagnosis not present

## 2019-02-23 DIAGNOSIS — K219 Gastro-esophageal reflux disease without esophagitis: Secondary | ICD-10-CM | POA: Diagnosis present

## 2019-02-23 DIAGNOSIS — E0590 Thyrotoxicosis, unspecified without thyrotoxic crisis or storm: Secondary | ICD-10-CM | POA: Diagnosis not present

## 2019-02-23 DIAGNOSIS — Z978 Presence of other specified devices: Secondary | ICD-10-CM

## 2019-02-23 DIAGNOSIS — J9690 Respiratory failure, unspecified, unspecified whether with hypoxia or hypercapnia: Secondary | ICD-10-CM | POA: Diagnosis not present

## 2019-02-23 DIAGNOSIS — I499 Cardiac arrhythmia, unspecified: Secondary | ICD-10-CM | POA: Diagnosis not present

## 2019-02-23 DIAGNOSIS — F419 Anxiety disorder, unspecified: Secondary | ICD-10-CM | POA: Diagnosis present

## 2019-02-23 DIAGNOSIS — R251 Tremor, unspecified: Secondary | ICD-10-CM | POA: Diagnosis present

## 2019-02-23 DIAGNOSIS — Z825 Family history of asthma and other chronic lower respiratory diseases: Secondary | ICD-10-CM

## 2019-02-23 DIAGNOSIS — I25118 Atherosclerotic heart disease of native coronary artery with other forms of angina pectoris: Secondary | ICD-10-CM | POA: Diagnosis not present

## 2019-02-23 DIAGNOSIS — J189 Pneumonia, unspecified organism: Secondary | ICD-10-CM | POA: Diagnosis not present

## 2019-02-23 DIAGNOSIS — Z91048 Other nonmedicinal substance allergy status: Secondary | ICD-10-CM

## 2019-02-23 DIAGNOSIS — E875 Hyperkalemia: Secondary | ICD-10-CM | POA: Diagnosis not present

## 2019-02-23 DIAGNOSIS — I361 Nonrheumatic tricuspid (valve) insufficiency: Secondary | ICD-10-CM | POA: Diagnosis not present

## 2019-02-23 DIAGNOSIS — R570 Cardiogenic shock: Secondary | ICD-10-CM | POA: Diagnosis not present

## 2019-02-23 DIAGNOSIS — Z885 Allergy status to narcotic agent status: Secondary | ICD-10-CM

## 2019-02-23 DIAGNOSIS — Z0189 Encounter for other specified special examinations: Secondary | ICD-10-CM

## 2019-02-23 DIAGNOSIS — J90 Pleural effusion, not elsewhere classified: Secondary | ICD-10-CM | POA: Diagnosis not present

## 2019-02-23 DIAGNOSIS — G4733 Obstructive sleep apnea (adult) (pediatric): Secondary | ICD-10-CM | POA: Diagnosis present

## 2019-02-23 DIAGNOSIS — E874 Mixed disorder of acid-base balance: Secondary | ICD-10-CM | POA: Diagnosis not present

## 2019-02-23 DIAGNOSIS — I25119 Atherosclerotic heart disease of native coronary artery with unspecified angina pectoris: Secondary | ICD-10-CM | POA: Diagnosis present

## 2019-02-23 DIAGNOSIS — I5043 Acute on chronic combined systolic (congestive) and diastolic (congestive) heart failure: Secondary | ICD-10-CM | POA: Diagnosis not present

## 2019-02-23 DIAGNOSIS — Z515 Encounter for palliative care: Secondary | ICD-10-CM | POA: Diagnosis not present

## 2019-02-23 DIAGNOSIS — J811 Chronic pulmonary edema: Secondary | ICD-10-CM

## 2019-02-23 DIAGNOSIS — R0602 Shortness of breath: Secondary | ICD-10-CM | POA: Diagnosis not present

## 2019-02-23 DIAGNOSIS — J440 Chronic obstructive pulmonary disease with acute lower respiratory infection: Secondary | ICD-10-CM | POA: Diagnosis not present

## 2019-02-23 DIAGNOSIS — T380X5A Adverse effect of glucocorticoids and synthetic analogues, initial encounter: Secondary | ICD-10-CM | POA: Diagnosis present

## 2019-02-23 DIAGNOSIS — M19042 Primary osteoarthritis, left hand: Secondary | ICD-10-CM | POA: Diagnosis present

## 2019-02-23 DIAGNOSIS — Y95 Nosocomial condition: Secondary | ICD-10-CM | POA: Diagnosis not present

## 2019-02-23 DIAGNOSIS — D649 Anemia, unspecified: Secondary | ICD-10-CM | POA: Diagnosis not present

## 2019-02-23 DIAGNOSIS — IMO0002 Reserved for concepts with insufficient information to code with codable children: Secondary | ICD-10-CM | POA: Diagnosis present

## 2019-02-23 DIAGNOSIS — Z209 Contact with and (suspected) exposure to unspecified communicable disease: Secondary | ICD-10-CM | POA: Diagnosis not present

## 2019-02-23 DIAGNOSIS — I440 Atrioventricular block, first degree: Secondary | ICD-10-CM | POA: Diagnosis present

## 2019-02-23 DIAGNOSIS — E1142 Type 2 diabetes mellitus with diabetic polyneuropathy: Secondary | ICD-10-CM | POA: Diagnosis present

## 2019-02-23 DIAGNOSIS — I083 Combined rheumatic disorders of mitral, aortic and tricuspid valves: Secondary | ICD-10-CM | POA: Diagnosis present

## 2019-02-23 DIAGNOSIS — Y848 Other medical procedures as the cause of abnormal reaction of the patient, or of later complication, without mention of misadventure at the time of the procedure: Secondary | ICD-10-CM | POA: Diagnosis not present

## 2019-02-23 DIAGNOSIS — F418 Other specified anxiety disorders: Secondary | ICD-10-CM | POA: Diagnosis present

## 2019-02-23 DIAGNOSIS — Z803 Family history of malignant neoplasm of breast: Secondary | ICD-10-CM

## 2019-02-23 DIAGNOSIS — Z7901 Long term (current) use of anticoagulants: Secondary | ICD-10-CM

## 2019-02-23 DIAGNOSIS — I509 Heart failure, unspecified: Secondary | ICD-10-CM | POA: Diagnosis not present

## 2019-02-23 DIAGNOSIS — N183 Chronic kidney disease, stage 3 unspecified: Secondary | ICD-10-CM | POA: Diagnosis present

## 2019-02-23 DIAGNOSIS — Z801 Family history of malignant neoplasm of trachea, bronchus and lung: Secondary | ICD-10-CM

## 2019-02-23 DIAGNOSIS — T502X5A Adverse effect of carbonic-anhydrase inhibitors, benzothiadiazides and other diuretics, initial encounter: Secondary | ICD-10-CM | POA: Diagnosis not present

## 2019-02-23 LAB — CBC WITH DIFFERENTIAL/PLATELET
Abs Immature Granulocytes: 0.06 10*3/uL (ref 0.00–0.07)
Basophils Absolute: 0 10*3/uL (ref 0.0–0.1)
Basophils Relative: 0 %
Eosinophils Absolute: 0.1 10*3/uL (ref 0.0–0.5)
Eosinophils Relative: 1 %
HCT: 32.3 % — ABNORMAL LOW (ref 36.0–46.0)
Hemoglobin: 10.1 g/dL — ABNORMAL LOW (ref 12.0–15.0)
Immature Granulocytes: 1 %
Lymphocytes Relative: 13 %
Lymphs Abs: 1.4 10*3/uL (ref 0.7–4.0)
MCH: 26.9 pg (ref 26.0–34.0)
MCHC: 31.3 g/dL (ref 30.0–36.0)
MCV: 86.1 fL (ref 80.0–100.0)
Monocytes Absolute: 1.2 10*3/uL — ABNORMAL HIGH (ref 0.1–1.0)
Monocytes Relative: 11 %
Neutro Abs: 8.1 10*3/uL — ABNORMAL HIGH (ref 1.7–7.7)
Neutrophils Relative %: 74 %
Platelets: 356 10*3/uL (ref 150–400)
RBC: 3.75 MIL/uL — ABNORMAL LOW (ref 3.87–5.11)
RDW: 15.8 % — ABNORMAL HIGH (ref 11.5–15.5)
WBC: 10.8 10*3/uL — ABNORMAL HIGH (ref 4.0–10.5)
nRBC: 0.9 % — ABNORMAL HIGH (ref 0.0–0.2)

## 2019-02-23 LAB — COMPREHENSIVE METABOLIC PANEL
ALT: 149 U/L — ABNORMAL HIGH (ref 0–44)
AST: 93 U/L — ABNORMAL HIGH (ref 15–41)
Albumin: 3.3 g/dL — ABNORMAL LOW (ref 3.5–5.0)
Alkaline Phosphatase: 153 U/L — ABNORMAL HIGH (ref 38–126)
Anion gap: 14 (ref 5–15)
BUN: 31 mg/dL — ABNORMAL HIGH (ref 8–23)
CO2: 26 mmol/L (ref 22–32)
Calcium: 9.2 mg/dL (ref 8.9–10.3)
Chloride: 89 mmol/L — ABNORMAL LOW (ref 98–111)
Creatinine, Ser: 1.37 mg/dL — ABNORMAL HIGH (ref 0.44–1.00)
GFR calc Af Amer: 46 mL/min — ABNORMAL LOW (ref >60)
GFR calc non Af Amer: 40 mL/min — ABNORMAL LOW (ref >60)
Glucose, Bld: 85 mg/dL (ref 70–99)
Potassium: 4.2 mmol/L (ref 3.5–5.1)
Sodium: 129 mmol/L — ABNORMAL LOW (ref 135–145)
Total Bilirubin: 1 mg/dL (ref 0.3–1.2)
Total Protein: 6.6 g/dL (ref 6.5–8.1)

## 2019-02-23 LAB — PROTIME-INR
INR: 2 — ABNORMAL HIGH (ref 0.8–1.2)
Prothrombin Time: 22.5 seconds — ABNORMAL HIGH (ref 11.4–15.2)

## 2019-02-23 LAB — SARS CORONAVIRUS 2 BY RT PCR (HOSPITAL ORDER, PERFORMED IN ~~LOC~~ HOSPITAL LAB): SARS Coronavirus 2: NEGATIVE

## 2019-02-23 LAB — HIV ANTIBODY (ROUTINE TESTING W REFLEX): HIV Screen 4th Generation wRfx: NONREACTIVE

## 2019-02-23 LAB — T4, FREE: Free T4: 2.76 ng/dL — ABNORMAL HIGH (ref 0.61–1.12)

## 2019-02-23 LAB — HEMOGLOBIN A1C
Hgb A1c MFr Bld: 8.1 % — ABNORMAL HIGH (ref 4.8–5.6)
Mean Plasma Glucose: 185.77 mg/dL

## 2019-02-23 LAB — BRAIN NATRIURETIC PEPTIDE: B Natriuretic Peptide: 1119 pg/mL — ABNORMAL HIGH (ref 0.0–100.0)

## 2019-02-23 LAB — MAGNESIUM: Magnesium: 1.9 mg/dL (ref 1.7–2.4)

## 2019-02-23 LAB — TROPONIN I (HIGH SENSITIVITY)
Troponin I (High Sensitivity): 375 ng/L (ref <18)
Troponin I (High Sensitivity): 197 ng/L (ref <18)
Troponin I (High Sensitivity): 476 ng/L (ref <18)
Troponin I (High Sensitivity): 548 ng/L (ref <18)

## 2019-02-23 LAB — TSH: TSH: 0.098 u[IU]/mL — ABNORMAL LOW (ref 0.350–4.500)

## 2019-02-23 LAB — GLUCOSE, CAPILLARY: Glucose-Capillary: 199 mg/dL — ABNORMAL HIGH (ref 70–99)

## 2019-02-23 MED ORDER — MELATONIN 3 MG PO TABS
10.0000 mg | ORAL_TABLET | Freq: Every evening | ORAL | Status: DC | PRN
  Administered 2019-02-23: 10.5 mg via ORAL
  Filled 2019-02-23 (×3): qty 3.5

## 2019-02-23 MED ORDER — MAGNESIUM OXIDE 400 MG PO TABS
400.0000 mg | ORAL_TABLET | Freq: Every day | ORAL | Status: DC
  Filled 2019-02-23: qty 1

## 2019-02-23 MED ORDER — BUDESONIDE 0.25 MG/2ML IN SUSP
0.2500 mg | Freq: Two times a day (BID) | RESPIRATORY_TRACT | Status: DC
  Administered 2019-02-23 – 2019-03-07 (×24): 0.25 mg via RESPIRATORY_TRACT
  Filled 2019-02-23 (×24): qty 2

## 2019-02-23 MED ORDER — MAGNESIUM OXIDE 400 (241.3 MG) MG PO TABS: 400.0000 mg | ORAL_TABLET | Freq: Every day | ORAL | Status: DC

## 2019-02-23 MED ORDER — INSULIN ASPART 100 UNIT/ML ~~LOC~~ SOLN: 0.0000 [IU] | Freq: Every day | SUBCUTANEOUS | Status: DC

## 2019-02-23 MED ORDER — NITROGLYCERIN 0.4 MG SL SUBL
0.4000 mg | SUBLINGUAL_TABLET | SUBLINGUAL | Status: DC | PRN
  Administered 2019-02-23: 0.4 mg via SUBLINGUAL

## 2019-02-23 MED ORDER — FLUTICASONE PROPIONATE HFA 44 MCG/ACT IN AERO: 2.0000 | INHALATION_SPRAY | Freq: Two times a day (BID) | RESPIRATORY_TRACT | Status: DC

## 2019-02-23 MED ORDER — SODIUM CHLORIDE 0.9% FLUSH
3.0000 mL | Freq: Once | INTRAVENOUS | Status: AC
  Administered 2019-02-23: 3 mL via INTRAVENOUS

## 2019-02-23 MED ORDER — INSULIN DEGLUDEC 100 UNIT/ML ~~LOC~~ SOPN: 20.0000 [IU] | PEN_INJECTOR | Freq: Every day | SUBCUTANEOUS | Status: DC

## 2019-02-23 MED ORDER — APIXABAN 5 MG PO TABS
5.0000 mg | ORAL_TABLET | Freq: Two times a day (BID) | ORAL | Status: DC
  Administered 2019-02-23 – 2019-02-24 (×2): 5 mg via ORAL
  Filled 2019-02-23 (×3): qty 1

## 2019-02-23 MED ORDER — TRIAMCINOLONE ACETONIDE 0.1 % MT PSTE
1.0000 "application " | PASTE | Freq: Two times a day (BID) | OROMUCOSAL | Status: DC | PRN
  Filled 2019-02-23: qty 5

## 2019-02-23 MED ORDER — ACETAMINOPHEN 325 MG PO TABS: 650.0000 mg | ORAL_TABLET | ORAL | Status: DC | PRN

## 2019-02-23 MED ORDER — ALBUTEROL SULFATE (2.5 MG/3ML) 0.083% IN NEBU
3.0000 mL | INHALATION_SOLUTION | Freq: Four times a day (QID) | RESPIRATORY_TRACT | Status: DC | PRN
  Administered 2019-02-24 – 2019-03-07 (×4): 3 mL via RESPIRATORY_TRACT
  Filled 2019-02-23 (×4): qty 3

## 2019-02-23 MED ORDER — FAMOTIDINE 20 MG PO TABS
20.0000 mg | ORAL_TABLET | Freq: Every day | ORAL | Status: DC
  Administered 2019-02-24: 20 mg via ORAL
  Filled 2019-02-23: qty 1

## 2019-02-23 MED ORDER — CHOLESTYRAMINE 4 G PO PACK
2.0000 g | PACK | Freq: Every day | ORAL | Status: DC
  Filled 2019-02-23: qty 1

## 2019-02-23 MED ORDER — AMIODARONE LOAD VIA INFUSION
150.0000 mg | Freq: Once | INTRAVENOUS | Status: AC
  Administered 2019-02-23: 150 mg via INTRAVENOUS
  Filled 2019-02-23: qty 83.34

## 2019-02-23 MED ORDER — FLUOXETINE HCL 20 MG PO CAPS
20.0000 mg | ORAL_CAPSULE | Freq: Every day | ORAL | Status: DC
  Administered 2019-02-23: 20 mg via ORAL
  Filled 2019-02-23: qty 1

## 2019-02-23 MED ORDER — FUROSEMIDE 10 MG/ML IJ SOLN
40.0000 mg | Freq: Once | INTRAMUSCULAR | Status: AC
  Administered 2019-02-23: 40 mg via INTRAVENOUS
  Filled 2019-02-23: qty 4

## 2019-02-23 MED ORDER — LORATADINE 10 MG PO TABS
10.0000 mg | ORAL_TABLET | Freq: Every day | ORAL | Status: DC
  Administered 2019-02-23: 22:00:00 10 mg via ORAL
  Filled 2019-02-23: qty 1

## 2019-02-23 MED ORDER — DILTIAZEM LOAD VIA INFUSION
10.0000 mg | Freq: Once | INTRAVENOUS | Status: AC
  Administered 2019-02-23: 10 mg via INTRAVENOUS
  Filled 2019-02-23: qty 10

## 2019-02-23 MED ORDER — AMIODARONE HCL IN DEXTROSE 360-4.14 MG/200ML-% IV SOLN
60.0000 mg/h | INTRAVENOUS | Status: AC
  Administered 2019-02-23 (×2): 60 mg/h via INTRAVENOUS
  Filled 2019-02-23: qty 200

## 2019-02-23 MED ORDER — HYDRALAZINE HCL 50 MG PO TABS
50.0000 mg | ORAL_TABLET | Freq: Three times a day (TID) | ORAL | Status: DC
  Administered 2019-02-23: 22:00:00 50 mg via ORAL
  Filled 2019-02-23 (×2): qty 1

## 2019-02-23 MED ORDER — INSULIN ASPART 100 UNIT/ML ~~LOC~~ SOLN
0.0000 [IU] | Freq: Three times a day (TID) | SUBCUTANEOUS | Status: DC
  Administered 2019-02-24: 07:00:00 5 [IU] via SUBCUTANEOUS
  Administered 2019-02-25: 3 [IU] via SUBCUTANEOUS

## 2019-02-23 MED ORDER — METOPROLOL SUCCINATE ER 25 MG PO TB24
50.0000 mg | ORAL_TABLET | Freq: Two times a day (BID) | ORAL | Status: DC
  Administered 2019-02-23: 22:00:00 50 mg via ORAL
  Filled 2019-02-23 (×2): qty 2

## 2019-02-23 MED ORDER — BUPROPION HCL 75 MG PO TABS
75.0000 mg | ORAL_TABLET | Freq: Two times a day (BID) | ORAL | Status: DC
  Administered 2019-02-23 – 2019-02-24 (×2): 75 mg via ORAL
  Filled 2019-02-23 (×3): qty 1

## 2019-02-23 MED ORDER — FERROUS SULFATE 325 (65 FE) MG PO TABS
325.0000 mg | ORAL_TABLET | Freq: Every day | ORAL | Status: DC
  Administered 2019-02-24 – 2019-02-25 (×2): 325 mg via ORAL
  Filled 2019-02-23 (×2): qty 1

## 2019-02-23 MED ORDER — POLYETHYLENE GLYCOL 3350 17 G PO PACK: 17.0000 g | PACK | Freq: Every day | ORAL | Status: DC | PRN

## 2019-02-23 MED ORDER — INSULIN GLARGINE 100 UNIT/ML ~~LOC~~ SOLN
15.0000 [IU] | Freq: Every day | SUBCUTANEOUS | Status: DC
  Administered 2019-02-23 – 2019-02-26 (×4): 15 [IU] via SUBCUTANEOUS
  Filled 2019-02-23 (×8): qty 0.15

## 2019-02-23 MED ORDER — ATORVASTATIN CALCIUM 10 MG PO TABS
20.0000 mg | ORAL_TABLET | Freq: Every day | ORAL | Status: DC
  Administered 2019-02-24: 11:00:00 20 mg via ORAL
  Filled 2019-02-23: qty 2

## 2019-02-23 MED ORDER — ONDANSETRON HCL 4 MG/2ML IJ SOLN
4.0000 mg | Freq: Four times a day (QID) | INTRAMUSCULAR | Status: DC | PRN
  Administered 2019-02-23 – 2019-03-05 (×3): 4 mg via INTRAVENOUS
  Filled 2019-02-23 (×3): qty 2

## 2019-02-23 MED ORDER — METHIMAZOLE 10 MG PO TABS
10.0000 mg | ORAL_TABLET | Freq: Two times a day (BID) | ORAL | Status: DC
  Administered 2019-02-23 – 2019-02-24 (×2): 10 mg via ORAL
  Filled 2019-02-23 (×3): qty 1

## 2019-02-23 MED ORDER — DILTIAZEM HCL-DEXTROSE 125-5 MG/125ML-% IV SOLN (PREMIX)
5.0000 mg/h | INTRAVENOUS | Status: DC
  Administered 2019-02-23: 15 mg/h via INTRAVENOUS
  Administered 2019-02-23: 5 mg/h via INTRAVENOUS
  Filled 2019-02-23 (×3): qty 125

## 2019-02-23 MED ORDER — AMIODARONE HCL IN DEXTROSE 360-4.14 MG/200ML-% IV SOLN
60.0000 mg/h | INTRAVENOUS | Status: DC
  Administered 2019-02-24 – 2019-02-27 (×9): 30 mg/h via INTRAVENOUS
  Administered 2019-02-27 – 2019-03-07 (×31): 60 mg/h via INTRAVENOUS
  Filled 2019-02-23 (×22): qty 200
  Filled 2019-02-23: qty 400
  Filled 2019-02-23 (×18): qty 200

## 2019-02-23 NOTE — Patient Instructions (Signed)
° ° ° °  If you have lab work done today you will be contacted with your lab results within the next 2 weeks.  If you have not heard from us then please contact us. The fastest way to get your results is to register for My Chart. ° ° °IF you received an x-ray today, you will receive an invoice from [facility]. Please contact [facility] at [phone] with questions or concerns regarding your invoice.  ° °IF you received labwork today, you will receive an invoice from [facility]. Please contact [facility] at [phone] with questions or concerns regarding your invoice.  ° °Our billing staff will not be able to assist you with questions regarding bills from these companies. ° °You will be contacted with the lab results as soon as they are available. The fastest way to get your results is to activate your My Chart account. Instructions are located on the last page of this paperwork. If you have not heard from us regarding the results in 2 weeks, please contact this office. °  ° ° ° °

## 2019-02-23 NOTE — ED Notes (Signed)
Report attempted 

## 2019-02-23 NOTE — ED Notes (Signed)
This RN has called pharmacy to send cardizem.

## 2019-02-23 NOTE — ED Notes (Signed)
Dr. Schlossman at bedside. 

## 2019-02-23 NOTE — Progress Notes (Signed)
Spoke with Truman Hayward MD about ED nurses concern over running amiodorone and Cardizem simultaneously.  Lee MD confirmed his intent to run them together.

## 2019-02-23 NOTE — ED Notes (Signed)
PER EMS: pt arrives from urgent care with AFIB RVR - HR 160s associated with CP and SOB. Hx of Afib (takes Eliquis) and was scheduled for a cardioversion on Monday. A&OX4. States she has been in afib "for the past couple of weeks."  Her cardiologist is Dr. Golden Hurter.

## 2019-02-23 NOTE — H&P (Addendum)
History and Physical    STEPHIE FLINK [id number] DOB: 12-21-1950 DOA: 03/03/2019  PCP: Rutherford Guys, MD  Patient coming from: Home  I have personally briefly reviewed patient's old medical records in [place]  Chief Complaint: Chest pain and shortness of breath  HPI: Judith [name] is a 68 y.o. female with medical history significant of paroxysmal atrial fibrillation status post several sessions of cardioversion, V. tach in the past, asthma, CKD stage III, hyperlipidemia, hypertension, hypothyroidism and several other comorbidities presented to ED with a complaint of chest pain and shortness of breath.  Patient is a poor historian and she is very anxious currently.  She tells me that she started having shortness of breath several weeks ago and started having chest pain about 2 to 4 days ago.  According to her, shortness of breath is exertional and chest pain is intermittent which is dull and pressure-like, 8 out of 10 at its peak with no aggravating or relieving factor.  Nonradiating.  It is associated with intermittent nausea and shortness of breath but no dizziness or diaphoresis.  Apparently, she was supposed to have cardioversion on 02/19/2019 but that was canceled since patient had missed her Eliquis dosage for 3 consecutive days prior to that.  This was rescheduled for 3 weeks.  Patient ended up in the emergency department.  Of note, patient was recently started on prednisone about 3 days ago for " COPD" however I do not see COPD as 1 of her past medical history.  ED Course: Upon presentation to ED, patient blood pressure was fine however she was found to be in atrial fibrillation with RVR.  Her proBNP was elevated at 1119 (her baseline is around 400).  Her renal function was at baseline.  Chest x-ray showed cardiomegaly with normal pulmonary vascularity and no infiltrate.  Troponin was 375.  Cardiology was consulted and then hospital service for called to admit the patient.   Review of Systems: As per HPI otherwise negative.    Past Medical History:  Diagnosis Date  . Abnormal EKG 07/31/2013  . Arthritis    "hands" (03/06/2015)  . Asthma   . Carpal tunnel syndrome, bilateral   . Chronic kidney disease   . Complication of anesthesia    slow to wake up. Pt sts she woke up during surgery many years ago.  . Coronary artery disease     2 v CAD with CTO of the RCA and high grade bifurcational LCx/OM stenosis. S/P PCI DES x 2 to the LCx/OM.  Marland Kitchen Diabetic peripheral neuropathy ([place]) "since 1996"  . GERD (gastroesophageal reflux disease)   . Goiter   . Headache    migraines prior to menopause  . History of shingles 06/01/2013  . Hyperlipidemia LDL goal <70 10/13/2015  . Hypertension   . Hyperthyroidism   . Osteomyelitis of foot (HCC)    Right  . PAF (paroxysmal atrial fibrillation) ([place]) 04/29/2015   CHADS2VASC score of 5 now on Apixaban  . Pneumonia ~ 1976  . Sleep apnea    Bipap  . Tremors of nervous system   . Type II diabetes mellitus (HCC)    insulin dependent    Past Surgical History:  Procedure Laterality Date  . ABDOMINAL HYSTERECTOMY  1988   age 65; CERVICAL DYSPLASIA; ovaries intact.   . AMPUTATION Right 01/23/2016   Procedure: Right 3rd Ray Amputation;  Surgeon: [name], MD;  Location: [place];  Service: Orthopedics;  Laterality: Right;  . AMPUTATION Right  02/13/2016   Procedure: Right Transmetatarsal Amputation;  Surgeon: [name], MD;  Location: [place];  Service: Orthopedics;  Laterality: Right;  . AMPUTATION Right 04/15/2018   Procedure: AMPUTATION BELOW KNEE;  Surgeon: [name], MD;  Location: [place];  Service: Orthopedics;  Laterality: Right;  . BIOPSY  04/16/2018   Procedure: BIOPSY;  Surgeon: Irene Shipper, MD;  Location: [facility] ENDOSCOPY;  Service: Endoscopy;;  . CARDIAC CATHETERIZATION N/A 02/27/2015   Procedure: Left Heart Cath and Coronary Angiography;  Surgeon: [name], MD; LAD 40%, mCFX 80%, OM 70%, RCA 100%  calcified       . CARDIAC CATHETERIZATION N/A 03/06/2015   Procedure: Coronary Stent Intervention;  Surgeon: [name], MD;  Location: [place] [facility];  Service: Cardiovascular;  Laterality: N/A;  Mid CX 3.50x12 promus DES w/ 0% resdual and Prox OM1 2.50x20 promus DES w/ 20% residual  . CARDIOVERSION    . CARDIOVERSION N/A 05/22/2018   Procedure: CARDIOVERSION;  Surgeon: Skeet Latch, MD;  Location: [place];  Service: Cardiovascular;  Laterality: N/A;  . CARPAL TUNNEL RELEASE Right Nov 2015  . CARPAL TUNNEL RELEASE Right 1992; 05/2014   [name]; [place]  . CESAREAN SECTION  1982; 1984  . ESOPHAGOGASTRODUODENOSCOPY (EGD) WITH PROPOFOL N/A 04/16/2018   Procedure: ESOPHAGOGASTRODUODENOSCOPY (EGD) WITH PROPOFOL;  Surgeon: Irene Shipper, MD;  Location: [facility] ENDOSCOPY;  Service: Endoscopy;  Laterality: N/A;  . FOOT NEUROMA SURGERY Bilateral 2000  . I&D EXTREMITY Left 01/06/2018   Procedure: DEBRIDEMENT ULCER LEFT FOOT;  Surgeon: [name], MD;  Location: [place];  Service: Orthopedics;  Laterality: Left;  . KNEE ARTHROSCOPY Right ~ 2003   "meniscus repair"  . LIPOMA EXCISION Right 2020   thigh  . SHOULDER OPEN ROTATOR CUFF REPAIR Right 1996; 1998   "w/fracture repair"  . THYROID SURGERY  2000   "removed lots of nodules"  . TONSILLECTOMY  1976     reports that she quit smoking about 3 years ago. Her smoking use included cigarettes. She smoked 0.00 packs per day for 41.00 years. She has never used smokeless tobacco. She reports that she does not drink alcohol or use drugs.  Allergies  Allergen Reactions  . Contrast Media [Iodinated Diagnostic Agents] Hives and Other (See Comments)    Spoke to patient, Iodine allergy is really IV contrast allergy.   [name] [Insulin Aspart] Shortness Of Breath and Other (See Comments)    "breathing problems"  . Codeine Nausea And Vomiting and Other (See Comments)    HIGH DOSES-SEVERE VOMITING  . Iodine Other (See Comments)    MUST HAVE  BENADRYL PRIOR TO PROCEDURE AND RIGHT BEFORE TREATMENT TO COUNTERACT REACTION-BLISTERING REACTION DERMATOLOGICAL  . Penicillins Itching, Rash and Other (See Comments)    Has patient had a PCN reaction causing immediate rash, facial/tongue/throat swelling, SOB or lightheadedness with hypotension: no Has patient had a PCN reaction causing severe rash involving mucus membranes or skin necrosis: No Has patient had a PCN reaction that required hospitalization No Has patient had a PCN reaction occurring within the last 10 years: No If all of the above answers are "NO", then may proceed with Cephalosporin use.  CHEST SIZED RASH AND ITCHING   . Ace Inhibitors Cough  . Demerol [Meperidine] Nausea And Vomiting  . Dilaudid [Hydromorphone Hcl] Other (See Comments)    HEADACHE   . Neosporin [Neomycin-Bacitracin Zn-Polymyx] Itching, Rash and Other (See Comments)    MAKES REACTIONS WORSE WHEN USING AS PROPHYLACTIC  . Percocet [  Oxycodone-Acetaminophen] Rash  . Tape Itching and Rash    Family History  Problem Relation Age of Onset  . Cancer Mother 56       bronchial cancer  . Breast cancer Mother   . Lung cancer Mother   . Hypertension Father   . COPD Father   . Heart disease Father 13       CAD with cardiac stenting  . Heart attack Father   . Parkinson's disease Father   . Allergies Sister   . Breast cancer Maternal Grandmother   . Emphysema Maternal Grandfather   . Leukemia Paternal Grandmother   . Emphysema Paternal Grandfather   . Thyroid disease Neg Hx   . Colon cancer Neg Hx   . Stomach cancer Neg Hx   . Rectal cancer Neg Hx   . Esophageal cancer Neg Hx     Prior to Admission medications   Medication Sig Start Date End Date Taking? Authorizing Provider  albuterol (PROVENTIL HFA) 108 (90 Base) MCG/ACT inhaler Inhale 1-2 puffs into the lungs every 6 (six) hours as needed for wheezing or shortness of breath. 05/01/18   Angiulli, Lavon Paganini, PA-C  amiodarone (PACERONE) 200 MG tablet  Take 1 tablet (200 mg total) by mouth daily. 06/08/18   Sherran Needs, NP  atorvastatin (LIPITOR) 20 MG tablet Take 1 tablet (20 mg total) by mouth daily. 05/01/18   Angiulli, Lavon Paganini, PA-C  buPROPion (WELLBUTRIN) 75 MG tablet Take 75 mg by mouth 2 (two) times daily.    [provider]  cetirizine (ZYRTEC) 10 MG tablet Take 10 mg by mouth daily as needed for allergies.     [provider]  cholestyramine (QUESTRAN) 4 g packet Take 0.5 packets (2 g total) by mouth at bedtime. 01/29/19   Mauri Pole, MD  diltiazem (CARTIA XT) 120 MG 24 hr capsule Take 1 capsule (120 mg total) by mouth daily. 02/12/19   Bhagat, Bhavinkumar, PA  ELIQUIS 5 MG TABS tablet Take 1 tablet by mouth twice daily Patient taking differently: Take 5 mg by mouth 2 (two) times daily.  11/21/18   Sueanne Margarita, MD  famotidine (PEPCID) 20 MG tablet Take 1 tablet (20 mg total) by mouth 2 (two) times daily. Patient taking differently: Take 40 mg by mouth daily.  06/17/18   Rutherford Guys, MD  ferrous sulfate 325 (65 FE) MG tablet Take 325 mg by mouth daily as needed (Low iron).    [provider]  FLUoxetine (PROZAC) 20 MG capsule TAKE 1 CAPSULE AT BEDTIME Patient taking differently: Take 20 mg by mouth at bedtime.  01/03/19   Rutherford Guys, MD  fluticasone (FLOVENT HFA) 44 MCG/ACT inhaler Inhale 2 puffs into the lungs 2 (two) times daily. Patient taking differently: Inhale 2 puffs into the lungs daily as needed (Allergies).  01/22/19   Rutherford Guys, MD  furosemide (LASIX) 40 MG tablet Take 1 tablet (40 mg total) by mouth daily. 01/18/19   Rutherford Guys, MD  hydrALAZINE (APRESOLINE) 50 MG tablet Take 1 tablet (50 mg total) by mouth 3 (three) times daily. 10/13/18   Isaiah Serge, NP  insulin degludec (TRESIBA FLEXTOUCH) 100 UNIT/ML SOPN FlexTouch Pen Inject 20 Units into the skin at bedtime.     [provider]  ketoconazole (NIZORAL) 2 % cream Apply 1 application topically daily.  02/21/19   Maximiano Coss, NP  loratadine (CLARITIN) 10 MG tablet Take 10 mg by mouth at bedtime.  [provider]  magnesium oxide (MAG-OX) 400 MG tablet Take 400 mg by mouth at bedtime.     [provider]  Melatonin 10 MG TABS Take 10 mg by mouth at bedtime as needed (Sleep).     [provider]  metFORMIN (GLUCOPHAGE-XR) 500 MG 24 hr tablet Take 1,000 mg by mouth daily with breakfast.    [provider]  methimazole (TAPAZOLE) 10 MG tablet TAKE 1 TABLET (10 MG TOTAL) BY MOUTH DAILY. 02/22/2019   Renato Shin, MD  metoprolol succinate (TOPROL-XL) 25 MG 24 hr tablet Take 50 mg by mouth 2 (two) times daily.     [provider]  mupirocin ointment (BACTROBAN) 2 % Apply 1 application topically 2 (two) times daily as needed (wound/sores).     [provider]  nitroGLYCERIN (NITROSTAT) 0.4 MG SL tablet Place 0.4 mg under the tongue every 5 (five) minutes x 3 doses as needed for chest pain.     [provider]  polyethylene glycol powder (GLYCOLAX/MIRALAX) 17 GM/SCOOP powder Take 17 g by mouth daily as needed (constipation.).     [provider]  potassium chloride (K-DUR) 10 MEQ tablet Take 1 tablet (10 mEq total) by mouth 2 (two) times daily. Patient taking differently: Take 10 mEq by mouth daily.  01/18/19   Rutherford Guys, MD  predniSONE (DELTASONE) 10 MG tablet Take 3 tablets (30 mg total) by mouth daily with breakfast for 2 days, THEN 2 tablets (20 mg total) daily with breakfast for 2 days, THEN 1 tablet (10 mg total) daily with breakfast for 3 days. 02/21/19 02/28/19  Maximiano Coss, NP  Respiratory Therapy Supplies DEVI BI-PAP @@ Bedtime    [provider]  triamcinolone (KENALOG) 0.1 % paste Use as directed 1 application in the mouth or throat 2 (two) times daily as needed (ulcer lesion.). 02/05/19   Rutherford Guys, MD  TRUE METRIX BLOOD GLUCOSE TEST test strip TEST BLOOD SUGAR TWICE DAILY 09/16/18   Renato Shin, MD    Physical Exam: Vitals:   02/17/2019 1600 02/28/2019 1615 02/10/2019 1724 03/08/2019 1730  BP: (!) 125/105 135/86 (!) 124/94 114/71  Pulse:      Resp: 17 19 (!) 23 18  Temp:      TempSrc:      SpO2:      Weight:      Height:        Constitutional: NAD, calm, comfortable Vitals:   02/12/2019 1600 02/22/2019 1615 03/03/2019 1724 03/09/2019 1730  BP: (!) 125/105 135/86 (!) 124/94 114/71  Pulse:      Resp: 17 19 (!) 23 18  Temp:      TempSrc:      SpO2:      Weight:      Height:       Eyes: PERRL, lids and conjunctivae normal, anxious ENMT: Mucous membranes are moist. Posterior pharynx clear of any exudate or lesions.Normal dentition.  Neck: normal, supple, no masses, no thyromegaly Respiratory: Crackles at right base, no wheezing, no crackles. Normal respiratory effort. No accessory muscle use.  Cardiovascular: Irregularly irregular rhythm and fast heart rate, no murmurs / rubs / gallops. No extremity edema. 2+ pedal pulses. No carotid bruits.  Abdomen: no tenderness, no masses palpated. No hepatosplenomegaly. Bowel sounds positive.  Musculoskeletal: no clubbing / cyanosis. No joint deformity upper and lower extremities. Good ROM, no contractures. Normal muscle tone.  Skin: no rashes, lesions, ulcers. No induration Neurologic: CN 2-12 grossly intact. Sensation intact, DTR  normal. Strength 5/5 in all 4.  Psychiatric: Anxious but alert and oriented.   Labs on Admission: I have personally reviewed following labs and imaging studies  CBC: Recent Labs  Lab 02/26/2019 1459  WBC 10.8*  NEUTROABS 8.1*  HGB 10.1*  HCT 32.3*  MCV 86.1  PLT [id number]   Basic Metabolic Panel: Recent Labs  Lab 02/24/2019 1459  NA 129*  K 4.2  CL 89*  CO2 26  GLUCOSE 85  BUN 31*  CREATININE 1.37*  CALCIUM 9.2   GFR: Estimated Creatinine Clearance: 42.9 mL/min (A) (by C-G formula based on SCr of 1.37 mg/dL (H)). Liver Function Tests: Recent Labs  Lab 02/20/2019 1459  AST 93*  ALT 149*  ALKPHOS  153*  BILITOT 1.0  PROT 6.6  ALBUMIN 3.3*   No results for input(s): LIPASE, AMYLASE in the last 168 hours. No results for input(s): AMMONIA in the last 168 hours. Coagulation Profile: Recent Labs  Lab 02/17/2019 1644  INR 2.0*   Cardiac Enzymes: No results for input(s): CKTOTAL, CKMB, CKMBINDEX, TROPONINI in the last 168 hours. BNP (last 3 results) Recent Labs    10/13/18 1057 01/18/19 1445  PROBNP 2,908* 2,645*   HbA1C: No results for input(s): HGBA1C in the last 72 hours. CBG: No results for input(s): GLUCAP in the last 168 hours. Lipid Profile: No results for input(s): CHOL, HDL, LDLCALC, TRIG, CHOLHDL, LDLDIRECT in the last 72 hours. Thyroid Function Tests: Recent Labs    02/24/2019 1459  TSH 0.098*  FREET4 2.76*   Anemia Panel: No results for input(s): VITAMINB12, FOLATE, FERRITIN, TIBC, IRON, RETICCTPCT in the last 72 hours. Urine analysis:    Component Value Date/Time   COLORURINE YELLOW 05/14/2018 1119   APPEARANCEUR CLOUDY (A) 05/14/2018 1119   LABSPEC 1.017 05/14/2018 1119   PHURINE 5.0 05/14/2018 1119   GLUCOSEU >=500 (A) 05/14/2018 1119   HGBUR SMALL (A) 05/14/2018 1119   BILIRUBINUR NEGATIVE 05/14/2018 1119   BILIRUBINUR negative 07/22/2017 0858   BILIRUBINUR Small 12/06/2014 1441   KETONESUR NEGATIVE 05/14/2018 1119   PROTEINUR 100 (A) 05/14/2018 1119   UROBILINOGEN 0.2 07/22/2017 0858   UROBILINOGEN 1.0 02/12/2015 1238   NITRITE NEGATIVE 05/14/2018 1119   LEUKOCYTESUR LARGE (A) 05/14/2018 1119    Radiological Exams on Admission: Dg Chest Portable 1 View  Result Date: 03/02/2019 CLINICAL DATA:  AFib.  Chest pain.  Shortness of breath. EXAM: PORTABLE CHEST 1 VIEW COMPARISON:  Chest x-ray 01/18/2019. FINDINGS: Mediastinum and hilar structures normal. Cardiomegaly with normal pulmonary vascularity. No focal infiltrate. No pleural effusion noted on today's exam. Previously identified right pleural effusion has cleared. IMPRESSION: 1. Cardiomegaly.  No pulmonary venous congestion. No pleural effusion noted on today's exam. Previously identified right pleural effusion has cleared. 2.  No acute pulmonary disease. Electronically Signed   By: Marcello Moores  Register   On: 02/22/2019 15:19    EKG: Independently reviewed.  Which shows atrial fibrillation with RVR at 156/min  Assessment/Plan Active Problems:   DM (diabetes mellitus), type 2 with peripheral vascular complications (HCC)   HTN (hypertension)   Depression with anxiety   Hyperthyroidism   Hyperlipidemia LDL goal <70   Atrial fibrillation with rapid ventricular response (HCC)   Type II diabetes mellitus, uncontrolled ([place])   CKD (chronic kidney disease), stage III   Paroxysmal atrial fibrillation with RVR: As mentioned above, patient has a history of cardioversion several times according to patient.  Her cardioversion was canceled on 1012 and this was rescheduled 3 weeks later due to her  missing her Eliquis 3 days in a row prior to procedure.  She has been started on Cardizem drip in the emergency department but despite of that her rates are currently around 130.  When I was seeing patient, cardiology resident was in patient's room as well.  I was informed that patient will be started on amiodarone drip.  Please note that patient is also on beta-blocker, Cardizem and amiodarone p.o. at home.  I will hold oral Cardizem and amiodarone but resume beta-blocker.  Likely cause of her RVR is uncontrolled hyperthyroidism.  Acute on chronic diastolic congestive heart failure: Although chest x-ray does not seem to be overly impressive but she does have some crackles on exam and has elevated proBNP.  I will give her a dose of Lasix IV 40 mg x 1 and defer further management to cardiology.  Her last echo was done in January 2020.  Hypothyroidism: Patient's home medications is methimazole 10 mg.  According to patient, she saw her endocrinologist last week and her dose was increased to 15 mg.  She confirmed  that she has been taking 1 and half tablet per day.  Her TSH today 0.098 and free T4 is 2.76, more than doubled the normal upper limit.  At this point in time, I will increase her methimazole dose to 20 mg and break it into 10 mg twice daily for better control.   Elevated troponin: Likely due to being in RVR.  Follow serial cardiac enzymes.  Will order transthoracic echo.  Hyperlipidemia: Resume home dose of atorvastatin.  History of anxiety and depression: Resume home dose of Prozac.  Essential hypertension: Blood pressure fairly controlled.  Resume hydralazine 50 mg p.o. 3 times daily, Toprol-XL 50 mg p.o. daily.  Will hold oral diltiazem.  Type 2 diabetes mellitus: She seems to be taking Tresiba 20 units at night.  I will start her on Lantus 15 units at night along with SSI.  DVT prophylaxis: Eliquis Code Status: Full code Family Communication: None present at bedside.  Plan of care discussed with patient in length and he verbalized understanding and agreed with it. Disposition Plan: Likely home in next 2 to 3 days. Consults called: Cardiology Admission status: Inpatient   Darliss Cheney MD [facility]  02/20/2019, 5:46 PM  To contact the attending provider between 7A-7P or the covering provider during after hours 7P-7A, please log into the web site [email or web address] and use password [email or web address].

## 2019-02-23 NOTE — Consult Note (Addendum)
Cardiology Consultation:   Patient ID: ELLIANA [name]; [id number]; February 10, 1951   Admit date: 02/22/2019 Date of Consult: 02/14/2019  Primary Care Provider: Rutherford Guys, MD Primary Cardiologist: Dr.Turner Primary Electrophysiologist:  Dr.Allred   Patient Profile:   Judith [name] is a 68 y.o. female with a hx of hyperthyroidism, T2DM, PAF on Eliquis, HTN, HLD, OSA who is being seen today for the evaluation of A.fib w/ RVR at the request of [place].  History of Present Illness:   Judith [name] was examined and evaluated at bedside this AM. She states she was in her usual state of health until she went to her PCP office for rash and dyspnea. She was prescribed 1 week course of prednisone which she started. Since then, she developed worsening palpitations, dyspnea and bloating sensation. She mentions she also noticed substernal chest pressure alleviated by nitroglycerin as well as worsening hyperglycemia from her diabetes. She went to her PCP's office for follow up and was sent from her PCP's office to ED for evaluation.   On chart review, she has history of persistent A.fib first diagnosed in 2019 post-op from amputation for her diabetic ulcer. She had a successful cardioversion on 05/22/2018 with rhythm return to sinus. However, she returned to A.fib clinic on 02/15/19 and was found to be back in a.fib with worsening symptoms of dyspnea and chest discomfort. She was planned for cardioversion on 02/19/19 but it was cancelled due to her misunderstanding pre-op instructions and not taking her anticoagulation.  She has has prior hx of obstructive coronary artery disease requiring bifurcation stenting due to occluded RCA and left circumflex stenosis.   Past Medical History:  Diagnosis Date  . Abnormal EKG 07/31/2013  . Arthritis    "hands" (03/06/2015)  . Asthma   . Carpal tunnel syndrome, bilateral   . Chronic kidney disease   . Complication of anesthesia    slow to wake up. Pt sts  she woke up during surgery many years ago.  . Coronary artery disease     2 v CAD with CTO of the RCA and high grade bifurcational LCx/OM stenosis. S/P PCI DES x 2 to the LCx/OM.  Marland Kitchen Diabetic peripheral neuropathy ([place]) "since 1996"  . GERD (gastroesophageal reflux disease)   . Goiter   . Headache    migraines prior to menopause  . History of shingles 06/01/2013  . Hyperlipidemia LDL goal <70 10/13/2015  . Hypertension   . Hyperthyroidism   . Osteomyelitis of foot (HCC)    Right  . PAF (paroxysmal atrial fibrillation) ([place]) 04/29/2015   CHADS2VASC score of 5 now on Apixaban  . Pneumonia ~ 1976  . Sleep apnea    Bipap  . Tremors of nervous system   . Type II diabetes mellitus (HCC)    insulin dependent    Past Surgical History:  Procedure Laterality Date  . ABDOMINAL HYSTERECTOMY  1988   age 16; CERVICAL DYSPLASIA; ovaries intact.   . AMPUTATION Right 01/23/2016   Procedure: Right 3rd Ray Amputation;  Surgeon: [name], MD;  Location: [facility];  Service: Orthopedics;  Laterality: Right;  . AMPUTATION Right 02/13/2016   Procedure: Right Transmetatarsal Amputation;  Surgeon: [name], MD;  Location: [facility];  Service: Orthopedics;  Laterality: Right;  . AMPUTATION Right 04/15/2018   Procedure: AMPUTATION BELOW KNEE;  Surgeon: [name], MD;  Location: [facility];  Service: Orthopedics;  Laterality: Right;  . BIOPSY  04/16/2018   Procedure: BIOPSY;  Surgeon: Scarlette Shorts  N, MD;  Location: [facility];  Service: Endoscopy;;  . CARDIAC CATHETERIZATION N/A 02/27/2015   Procedure: Left Heart Cath and Coronary Angiography;  Surgeon: [name], MD; LAD 40%, mCFX 80%, OM 70%, RCA 100% calcified       . CARDIAC CATHETERIZATION N/A 03/06/2015   Procedure: Coronary Stent Intervention;  Surgeon: [name], MD;  Location: [facility];  Service: Cardiovascular;  Laterality: N/A;  Mid CX 3.50x12 promus DES w/ 0% resdual and Prox OM1 2.50x20 promus DES w/ 20% residual  .  CARDIOVERSION    . CARDIOVERSION N/A 05/22/2018   Procedure: CARDIOVERSION;  Surgeon: Skeet Latch, MD;  Location: [facility];  Service: Cardiovascular;  Laterality: N/A;  . CARPAL TUNNEL RELEASE Right Nov 2015  . CARPAL TUNNEL RELEASE Right 1992; 05/2014   [name]; [place]  . CESAREAN SECTION  1982; 1984  . ESOPHAGOGASTRODUODENOSCOPY (EGD) WITH PROPOFOL N/A 04/16/2018   Procedure: ESOPHAGOGASTRODUODENOSCOPY (EGD) WITH PROPOFOL;  Surgeon: Irene Shipper, MD;  Location: [facility];  Service: Endoscopy;  Laterality: N/A;  . FOOT NEUROMA SURGERY Bilateral 2000  . I&D EXTREMITY Left 01/06/2018   Procedure: DEBRIDEMENT ULCER LEFT FOOT;  Surgeon: [name], MD;  Location: [facility];  Service: Orthopedics;  Laterality: Left;  . KNEE ARTHROSCOPY Right ~ 2003   "meniscus repair"  . LIPOMA EXCISION Right 2020   thigh  . SHOULDER OPEN ROTATOR CUFF REPAIR Right 1996; 1998   "w/fracture repair"  . THYROID SURGERY  2000   "removed lots of nodules"  . TONSILLECTOMY  1976     Home Medications:  Prior to Admission medications   Medication Sig Start Date End Date Taking? Authorizing Provider  albuterol (PROVENTIL HFA) 108 (90 Base) MCG/ACT inhaler Inhale 1-2 puffs into the lungs every 6 (six) hours as needed for wheezing or shortness of breath. 05/01/18   Angiulli, Lavon Paganini, PA-C  amiodarone (PACERONE) 200 MG tablet Take 1 tablet (200 mg total) by mouth daily. 06/08/18   Sherran Needs, NP  atorvastatin (LIPITOR) 20 MG tablet Take 1 tablet (20 mg total) by mouth daily. 05/01/18   Angiulli, Lavon Paganini, PA-C  buPROPion (WELLBUTRIN) 75 MG tablet Take 75 mg by mouth 2 (two) times daily.    [provider]  cetirizine (ZYRTEC) 10 MG tablet Take 10 mg by mouth daily as needed for allergies.     [provider]  cholestyramine (QUESTRAN) 4 g packet Take 0.5 packets (2 g total) by mouth at bedtime. 01/29/19   Mauri Pole, MD  diltiazem (CARTIA XT) 120 MG 24 hr capsule Take 1  capsule (120 mg total) by mouth daily. 02/12/19   Bhagat, Bhavinkumar, PA  ELIQUIS 5 MG TABS tablet Take 1 tablet by mouth twice daily Patient taking differently: Take 5 mg by mouth 2 (two) times daily.  11/21/18   Sueanne Margarita, MD  famotidine (PEPCID) 20 MG tablet Take 1 tablet (20 mg total) by mouth 2 (two) times daily. Patient taking differently: Take 40 mg by mouth daily.  06/17/18   Rutherford Guys, MD  ferrous sulfate 325 (65 FE) MG tablet Take 325 mg by mouth daily as needed (Low iron).    [provider]  FLUoxetine (PROZAC) 20 MG capsule TAKE 1 CAPSULE AT BEDTIME Patient taking differently: Take 20 mg by mouth at bedtime.  01/03/19   Rutherford Guys, MD  fluticasone (FLOVENT HFA) 44 MCG/ACT inhaler Inhale 2 puffs into the lungs 2 (two) times daily. Patient taking differently: Inhale  2 puffs into the lungs daily as needed (Allergies).  01/22/19   Rutherford Guys, MD  furosemide (LASIX) 40 MG tablet Take 1 tablet (40 mg total) by mouth daily. 01/18/19   Rutherford Guys, MD  hydrALAZINE (APRESOLINE) 50 MG tablet Take 1 tablet (50 mg total) by mouth 3 (three) times daily. 10/13/18   Isaiah Serge, NP  insulin degludec (TRESIBA FLEXTOUCH) 100 UNIT/ML SOPN FlexTouch Pen Inject 20 Units into the skin at bedtime.     [provider]  ketoconazole (NIZORAL) 2 % cream Apply 1 application topically daily. 02/21/19   Maximiano Coss, NP  loratadine (CLARITIN) 10 MG tablet Take 10 mg by mouth at bedtime.    [provider]  magnesium oxide (MAG-OX) 400 MG tablet Take 400 mg by mouth at bedtime.     [provider]  Melatonin 10 MG TABS Take 10 mg by mouth at bedtime as needed (Sleep).     [provider]  metFORMIN (GLUCOPHAGE-XR) 500 MG 24 hr tablet Take 1,000 mg by mouth daily with breakfast.    [provider]  methimazole (TAPAZOLE) 10 MG tablet TAKE 1 TABLET (10 MG TOTAL) BY MOUTH DAILY. 03/06/2019   Renato Shin, MD  metoprolol succinate  (TOPROL-XL) 25 MG 24 hr tablet Take 50 mg by mouth 2 (two) times daily.     [provider]  mupirocin ointment (BACTROBAN) 2 % Apply 1 application topically 2 (two) times daily as needed (wound/sores).     [provider]  nitroGLYCERIN (NITROSTAT) 0.4 MG SL tablet Place 0.4 mg under the tongue every 5 (five) minutes x 3 doses as needed for chest pain.     [provider]  polyethylene glycol powder (GLYCOLAX/MIRALAX) 17 GM/SCOOP powder Take 17 g by mouth daily as needed (constipation.).     [provider]  potassium chloride (K-DUR) 10 MEQ tablet Take 1 tablet (10 mEq total) by mouth 2 (two) times daily. Patient taking differently: Take 10 mEq by mouth daily.  01/18/19   Rutherford Guys, MD  predniSONE (DELTASONE) 10 MG tablet Take 3 tablets (30 mg total) by mouth daily with breakfast for 2 days, THEN 2 tablets (20 mg total) daily with breakfast for 2 days, THEN 1 tablet (10 mg total) daily with breakfast for 3 days. 02/21/19 02/28/19  Maximiano Coss, NP  Respiratory Therapy Supplies DEVI BI-PAP @@ Bedtime    [provider]  triamcinolone (KENALOG) 0.1 % paste Use as directed 1 application in the mouth or throat 2 (two) times daily as needed (ulcer lesion.). 02/05/19   Rutherford Guys, MD  TRUE METRIX BLOOD GLUCOSE TEST test strip TEST BLOOD SUGAR TWICE DAILY 09/16/18   Renato Shin, MD    Inpatient Medications: Scheduled Meds:  Continuous Infusions: . sodium chloride    . diltiazem (CARDIZEM) infusion 15 mg/hr (02/16/2019 1624)   PRN Meds:   Allergies:    Allergies  Allergen Reactions  . Contrast Media [Iodinated Diagnostic Agents] Hives and Other (See Comments)    Spoke to patient, Iodine allergy is really IV contrast allergy.   [name] [Insulin Aspart] Shortness Of Breath and Other (See Comments)    "breathing problems"  . Codeine Nausea And Vomiting and Other (See Comments)    HIGH DOSES-SEVERE VOMITING  . Iodine Other (See Comments)     MUST HAVE BENADRYL PRIOR TO PROCEDURE AND RIGHT BEFORE TREATMENT TO COUNTERACT REACTION-BLISTERING REACTION DERMATOLOGICAL  . Penicillins Itching, Rash and Other (See Comments)  Has patient had a PCN reaction causing immediate rash, facial/tongue/throat swelling, SOB or lightheadedness with hypotension: no Has patient had a PCN reaction causing severe rash involving mucus membranes or skin necrosis: No Has patient had a PCN reaction that required hospitalization No Has patient had a PCN reaction occurring within the last 10 years: No If all of the above answers are "NO", then may proceed with Cephalosporin use.  CHEST SIZED RASH AND ITCHING   . Ace Inhibitors Cough  . Demerol [Meperidine] Nausea And Vomiting  . Dilaudid [Hydromorphone Hcl] Other (See Comments)    HEADACHE   . Neosporin [Neomycin-Bacitracin Zn-Polymyx] Itching, Rash and Other (See Comments)    MAKES REACTIONS WORSE WHEN USING AS PROPHYLACTIC  . Percocet [Oxycodone-Acetaminophen] Rash  . Tape Itching and Rash    Social History:   Social History   Socioeconomic History  . Marital status: Single    Spouse name: Not on file  . Number of children: 2  . Years of education: Masters  . Highest education level: Not on file  Occupational History  . Occupation: Landscape architect  Social Needs  . Financial resource strain: Somewhat hard  . Food insecurity    Worry: Sometimes true    Inability: Sometimes true  . Transportation needs    Medical: No    Non-medical: No  Tobacco Use  . Smoking status: Former Smoker    Packs/day: 0.00    Years: 41.00    Pack years: 0.00    Types: Cigarettes    Quit date: 03/05/2015    Years since quitting: 3.9  . Smokeless tobacco: Never Used  . Tobacco comment: 04/29/2015 "quit smoking cigarettes 02/27/2015"  Substance and Sexual Activity  . Alcohol use: No  . Drug use: No  . Sexual activity: Not Currently    Birth control/protection: Post-menopausal, Surgical  Lifestyle   . Physical activity    Days per week: 0 days    Minutes per session: 0 min  . Stress: Very much  Relationships  . Social connections    Talks on phone: More than three times a week    Gets together: More than three times a week    Attends religious service: More than 4 times per year    Active member of club or organization: No    Attends meetings of clubs or organizations: Never    Relationship status: Never married  . Intimate partner violence    Fear of current or ex partner: No    Emotionally abused: No    Physically abused: No    Forced sexual activity: No  Other Topics Concern  . 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      Lives with sister in [place].      Tobacco: 1 ppd x 41 years - quit 2016      Alcohol: none      Drugs: none      Exercise:  Walking in neighborhood; physical job.   Right-handed.   2 cups caffeine daily.    Family History:   Family History  Problem Relation Age of Onset  . Cancer Mother 13       bronchial cancer  . Breast cancer Mother   . Lung cancer Mother   .  Hypertension Father   . COPD Father   . Heart disease Father 81       CAD with cardiac stenting  . Heart attack Father   . Parkinson's disease Father   . Allergies Sister   . Breast cancer Maternal Grandmother   . Emphysema Maternal Grandfather   . Leukemia Paternal Grandmother   . Emphysema Paternal Grandfather   . Thyroid disease Neg Hx   . Colon cancer Neg Hx   . Stomach cancer Neg Hx   . Rectal cancer Neg Hx   . Esophageal cancer Neg Hx      ROS:  Please see the history of present illness.  Review of Systems  Constitution: Positive for malaise/fatigue. Negative for chills and fever.  HENT: Negative for congestion.   Cardiovascular:  Positive for chest pain and dyspnea on exertion. Negative for leg swelling.  Respiratory: Positive for shortness of breath. Negative for cough.   Gastrointestinal: Positive for bloating and constipation. Negative for diarrhea, nausea and vomiting.    All other ROS reviewed and negative.     Physical Exam/Data:   Vitals:   03/03/2019 1530 03/03/2019 1545 02/28/2019 1600 03/06/2019 1615  BP: 120/77 (!) 124/102 (!) 125/105 135/86  Pulse:  98    Resp:  16 17 19   Temp:      TempSrc:      SpO2: 99% 93%    Weight:      Height:       No intake or output data in the 24 hours ending 02/26/2019 1625 Filed Weights   03/03/2019 1445  Weight: 86.2 kg   Body mass index is 32.61 kg/m.   Gen: Well-developed, ill-appearing, obese HEENT: NCAT head, hearing intact, EOMI,  Neck: supple, ROM intact, no JVDy CV: Irregularly irregular, tachycardic Pulm: CTAB, No rales, no wheezes Abd: Soft, BS+, Distended Extm: ROM intact, Peripheral pulses intact, No peripheral edema, Amputated right leg w/ prosthesis Skin: Dry, Warm, poor turgor  EKG:  The EKG was personally reviewed and demonstrates:  A.flutter w/ RVR. T wave inversions on septal leads consistent w/ prior EKG.  Telemetry:  Telemetry was personally reviewed and demonstrates: A.flutter w/ RVR  Relevant CV Studies:  Myoview Stress Test 10/2018 Study Highlights    Nuclear stress EF: 57%.  There was no ST segment deviation noted during stress.  This is a low risk study.  The left ventricular ejection fraction is normal (55-65%).   Low risk stress test with no evidence of ischemia or infarction.    Laboratory Data:  Chemistry Recent Labs  Lab 02/09/2019 1459  NA 129*  K 4.2  CL 89*  CO2 26  GLUCOSE 85  BUN 31*  CREATININE 1.37*  CALCIUM 9.2  GFRNONAA 40*  GFRAA 46*  ANIONGAP 14    Recent Labs  Lab 02/09/2019 1459  PROT 6.6  ALBUMIN 3.3*  AST 93*  ALT 149*  ALKPHOS 153*  BILITOT 1.0   Hematology Recent Labs  Lab 02/19/2019  1459  WBC 10.8*  RBC 3.75*  HGB 10.1*  HCT 32.3*  MCV 86.1  MCH 26.9  MCHC 31.3  RDW 15.8*  PLT 356   Cardiac EnzymesNo results for input(s): TROPONINI in the last 168 hours. No results for input(s): TROPIPOC in the last 168 hours.  BNP Recent Labs  Lab 02/12/2019 1459  BNP 1,119.0*    DDimer No results for input(s): DDIMER in the last 168 hours.  Radiology/Studies:  Dg Chest Portable 1 View  Result Date: 02/28/2019 CLINICAL DATA:  AFib.  Chest pain.  Shortness of breath. EXAM: PORTABLE CHEST 1 VIEW COMPARISON:  Chest x-ray 01/18/2019. FINDINGS: Mediastinum and hilar structures normal. Cardiomegaly with normal pulmonary vascularity. No focal infiltrate. No pleural effusion noted on today's exam. Previously identified right pleural effusion has cleared. IMPRESSION: 1. Cardiomegaly. No pulmonary venous congestion. No pleural effusion noted on today's exam. Previously identified right pleural effusion has cleared. 2.  No acute pulmonary disease. Electronically Signed   By: Marcello Moores  Register   On: 02/21/2019 15:19    Assessment and Plan:   Symptomatic A.Flutter with RVR Angina Hx of PAF w/ plans for outpatient cardioversion. Chads-vasc of 5 due to F, Age, Vascular disease, Diabetes, HTN. Rate controlled with oral amio, dilt, metoprolol.  Now with RVR. Likely exacerbated by recent prednisone use for COPD exacerbation. HR >140 despite on dilt gtt. Endorsing chest pain which is likely due to demand from tachyarrhythmia. HsTroponin 375. Recent stress test w/o obstructive CAD.  - Stop prednisone - Start amiodarone drip - Trend troponin - Telemetry - Check Mag/K, replete if appropriate - C/w eliquis - If continues to be symptomatic, unable to control rate, will need TEE/DCCV. Keep NPO - C/w home meds: metoprolol 50mg  BID  Hyperthyroidism On methimazole 10mg  at home. TSH 0.098. T4 2.76. Per pt, her endocrinologist recently increased dose. Likely contributing to her A.fib as well. -  Increase to 15mg  daily  Chronic diastolic heart failure TTE on 05/2018 w/ EF of 123456, grade 1 diastolic dysfunction. BNP 1120 but appear dry on physical exam at this time. Chest X-ray w/o pulmonary edema - Hold home diuretics - Daily weights - I&Os  IDDM Exacerbated by recent prednisone use - SSI - Glucose checks  Acute on chronic hypoxic respiratory failure OSA COPD - CPAP qhs - Duoneb PRN - C/w home inhalers  For questions or updates, please contact [facility] Please consult [email or web address] for contact info under Cardiology/STEMI.   Signed, Gilberto Better, MD PGY-2, [facility] Pager: [phone] 02/15/2019 4:25 PM  Attending attestation to follow  I have examined the patient and reviewed assessment and plan and discussed with patient.  Agree with above as stated.    AFib with RVR despite high dose IV cardizem.  BP now in the [id number] systolic range.  Rate likely exacerbated by prednisone and hyperthyroidism.  BP limits use of other rate control meds.   Continue ELiquis.  Start IV Amiodarone for better rate control.  May be able to stop tomroow and increase oral dose.    As thyroid control improves, AFib may improve as well.   Larae Grooms

## 2019-02-23 NOTE — ED Notes (Signed)
Dr Truman Hayward paged regarding Amio drip

## 2019-02-23 NOTE — Progress Notes (Signed)
No chief complaint on file.   Patient presents for itchy eyes, bloating, and f/u of rash tx with steroids by Denice Paradise, NP.  However, she is visibly short of breath, tachycardic, in A-fib.  She is scheduled to be cardioverted on Monday.  We briefly discussed her rash and itchy eyes but had to move conversation immediately to her instability.    I am concerned given the shortness of breath and labs that recently show some dehydration potentially secondary to her diabetes.  Now, being on steroids, patient's blood sugars have skyrocketed.  She states she feels like she cannot get enough air and asks for oxygen.    Given this presentation, I have decided to put her on oxygen, hook her up to cardiac monitoring, and call EMS.      4 review of systems  Past Medical History:  Diagnosis Date  . Abnormal EKG 07/31/2013  . Arthritis    "hands" (03/06/2015)  . Asthma   . Carpal tunnel syndrome, bilateral   . Chronic kidney disease   . Complication of anesthesia    slow to wake up. Pt sts she woke up during surgery many years ago.  . Coronary artery disease     2 v CAD with CTO of the RCA and high grade bifurcational LCx/OM stenosis. S/P PCI DES x 2 to the LCx/OM.  Marland Kitchen Diabetic peripheral neuropathy ([place]) "since 1996"  . GERD (gastroesophageal reflux disease)   . Goiter   . Headache    migraines prior to menopause  . History of shingles 06/01/2013  . Hyperlipidemia LDL goal <70 10/13/2015  . Hypertension   . Hyperthyroidism   . Osteomyelitis of foot (HCC)    Right  . PAF (paroxysmal atrial fibrillation) ([place]) 04/29/2015   CHADS2VASC score of 5 now on Apixaban  . Pneumonia ~ 1976  . Sleep apnea    Bipap  . Tremors of nervous system   . Type II diabetes mellitus (HCC)    insulin dependent    Current Outpatient Medications  Medication Sig Dispense Refill  . albuterol (PROVENTIL HFA) 108 (90 Base) MCG/ACT inhaler Inhale 1-2 puffs into the lungs every 6 (six) hours as needed for wheezing or  shortness of breath. 1 Inhaler 0  . amiodarone (PACERONE) 200 MG tablet Take 1 tablet (200 mg total) by mouth daily. 30 tablet 6  . atorvastatin (LIPITOR) 20 MG tablet Take 1 tablet (20 mg total) by mouth daily. 90 tablet 3  . buPROPion (WELLBUTRIN) 75 MG tablet Take 75 mg by mouth 2 (two) times daily.    . cetirizine (ZYRTEC) 10 MG tablet Take 10 mg by mouth daily as needed for allergies.     . cholestyramine (QUESTRAN) 4 g packet Take 0.5 packets (2 g total) by mouth at bedtime. 60 each 0  . diltiazem (CARTIA XT) 120 MG 24 hr capsule Take 1 capsule (120 mg total) by mouth daily. 90 capsule 3  . ELIQUIS 5 MG TABS tablet Take 1 tablet by mouth twice daily (Patient taking differently: Take 5 mg by mouth 2 (two) times daily. ) 180 tablet 1  . famotidine (PEPCID) 20 MG tablet Take 1 tablet (20 mg total) by mouth 2 (two) times daily. (Patient taking differently: Take 40 mg by mouth daily. ) 60 tablet 3  . ferrous sulfate 325 (65 FE) MG tablet Take 325 mg by mouth daily as needed (Low iron).    Marland Kitchen FLUoxetine (PROZAC) 20 MG capsule TAKE 1 CAPSULE AT BEDTIME (Patient taking differently: Take  20 mg by mouth at bedtime. ) 90 capsule 0  . fluticasone (FLOVENT HFA) 44 MCG/ACT inhaler Inhale 2 puffs into the lungs 2 (two) times daily. (Patient taking differently: Inhale 2 puffs into the lungs daily as needed (Allergies). ) 1 Inhaler 3  . furosemide (LASIX) 40 MG tablet Take 1 tablet (40 mg total) by mouth daily. 30 tablet 2  . hydrALAZINE (APRESOLINE) 50 MG tablet Take 1 tablet (50 mg total) by mouth 3 (three) times daily. 270 tablet 3  . insulin degludec (TRESIBA FLEXTOUCH) 100 UNIT/ML SOPN FlexTouch Pen Inject 20 Units into the skin at bedtime.     Marland Kitchen ketoconazole (NIZORAL) 2 % cream Apply 1 application topically daily. 15 g 2  . loratadine (CLARITIN) 10 MG tablet Take 10 mg by mouth at bedtime.    . magnesium oxide (MAG-OX) 400 MG tablet Take 400 mg by mouth at bedtime.     . Melatonin 10 MG TABS Take 10 mg  by mouth at bedtime as needed (Sleep).     . metFORMIN (GLUCOPHAGE-XR) 500 MG 24 hr tablet Take 1,000 mg by mouth daily with breakfast.    . methimazole (TAPAZOLE) 10 MG tablet TAKE 1 TABLET (10 MG TOTAL) BY MOUTH DAILY. 90 tablet 0  . metoprolol succinate (TOPROL-XL) 25 MG 24 hr tablet Take 50 mg by mouth 2 (two) times daily.     . mupirocin ointment (BACTROBAN) 2 % Apply 1 application topically 2 (two) times daily as needed (wound/sores).     . nitroGLYCERIN (NITROSTAT) 0.4 MG SL tablet Place 0.4 mg under the tongue every 5 (five) minutes x 3 doses as needed for chest pain.     . polyethylene glycol powder (GLYCOLAX/MIRALAX) 17 GM/SCOOP powder Take 17 g by mouth daily as needed (constipation.).     Marland Kitchen potassium chloride (K-DUR) 10 MEQ tablet Take 1 tablet (10 mEq total) by mouth 2 (two) times daily. (Patient taking differently: Take 10 mEq by mouth daily. ) 60 tablet 1  . predniSONE (DELTASONE) 10 MG tablet Take 3 tablets (30 mg total) by mouth daily with breakfast for 2 days, THEN 2 tablets (20 mg total) daily with breakfast for 2 days, THEN 1 tablet (10 mg total) daily with breakfast for 3 days. 13 tablet 0  . Respiratory Therapy Supplies DEVI BI-PAP @@ Bedtime    . triamcinolone (KENALOG) 0.1 % paste Use as directed 1 application in the mouth or throat 2 (two) times daily as needed (ulcer lesion.). 5 g 0  . TRUE METRIX BLOOD GLUCOSE TEST test strip TEST BLOOD SUGAR TWICE DAILY 100 each 3   Current Facility-Administered Medications  Medication Dose Route Frequency Provider Last Rate Last Dose  . 0.9 %  sodium chloride infusion  500 mL Intravenous Once Mauri Pole, MD        Allergies:  Allergies  Allergen Reactions  . Contrast Media [Iodinated Diagnostic Agents] Hives and Other (See Comments)    Spoke to patient, Iodine allergy is really IV contrast allergy.   [name] [Insulin Aspart] Shortness Of Breath and Other (See Comments)    "breathing problems"  . Codeine Nausea And  Vomiting and Other (See Comments)    HIGH DOSES-SEVERE VOMITING  . Iodine Other (See Comments)    MUST HAVE BENADRYL PRIOR TO PROCEDURE AND RIGHT BEFORE TREATMENT TO COUNTERACT REACTION-BLISTERING REACTION DERMATOLOGICAL  . Penicillins Itching, Rash and Other (See Comments)    Has patient had a PCN reaction causing immediate rash, facial/tongue/throat swelling, SOB or lightheadedness  with hypotension: no Has patient had a PCN reaction causing severe rash involving mucus membranes or skin necrosis: No Has patient had a PCN reaction that required hospitalization No Has patient had a PCN reaction occurring within the last 10 years: No If all of the above answers are "NO", then may proceed with Cephalosporin use.  CHEST SIZED RASH AND ITCHING   . Ace Inhibitors Cough  . Demerol [Meperidine] Nausea And Vomiting  . Dilaudid [Hydromorphone Hcl] Other (See Comments)    HEADACHE   . Neosporin [Neomycin-Bacitracin Zn-Polymyx] Itching, Rash and Other (See Comments)    MAKES REACTIONS WORSE WHEN USING AS PROPHYLACTIC  . Percocet [Oxycodone-Acetaminophen] Rash  . Tape Itching and Rash    Past Surgical History:  Procedure Laterality Date  . ABDOMINAL HYSTERECTOMY  1988   age 71; CERVICAL DYSPLASIA; ovaries intact.   . AMPUTATION Right 01/23/2016   Procedure: Right 3rd Ray Amputation;  Surgeon: [name], MD;  Location: [place];  Service: Orthopedics;  Laterality: Right;  . AMPUTATION Right 02/13/2016   Procedure: Right Transmetatarsal Amputation;  Surgeon: [name], MD;  Location: [place];  Service: Orthopedics;  Laterality: Right;  . AMPUTATION Right 04/15/2018   Procedure: AMPUTATION BELOW KNEE;  Surgeon: [name], MD;  Location: [place];  Service: Orthopedics;  Laterality: Right;  . BIOPSY  04/16/2018   Procedure: BIOPSY;  Surgeon: Irene Shipper, MD;  [facility] ENDOSCOPY;  Service: Endoscopy;;  . CARDIAC CATHETERIZATION N/A 02/27/2015   Procedure: Left Heart Cath and Coronary  Angiography;  Surgeon: [name], MD; LAD 40%, mCFX 80%, OM 70%, RCA 100% calcified       . CARDIAC CATHETERIZATION N/A 03/06/2015   Procedure: Coronary Stent Intervention;  Surgeon: [name], MD;  Location: [place] [facility];  Service: Cardiovascular;  Laterality: N/A;  Mid CX 3.50x12 promus DES w/ 0% resdual and Prox OM1 2.50x20 promus DES w/ 20% residual  . CARDIOVERSION    . CARDIOVERSION N/A 05/22/2018   Procedure: CARDIOVERSION;  Surgeon: Skeet Latch, MD;  Location: [place];  Service: Cardiovascular;  Laterality: N/A;  . CARPAL TUNNEL RELEASE Right Nov 2015  . CARPAL TUNNEL RELEASE Right 1992; 05/2014   [name]; [place]  . CESAREAN SECTION  1982; 1984  . ESOPHAGOGASTRODUODENOSCOPY (EGD) WITH PROPOFOL N/A 04/16/2018   Procedure: ESOPHAGOGASTRODUODENOSCOPY (EGD) WITH PROPOFOL;  Surgeon: Irene Shipper, MD;  Location[facility] ENDOSCOPY;  Service: Endoscopy;  Laterality: N/A;  . FOOT NEUROMA SURGERY Bilateral 2000  . I&D EXTREMITY Left 01/06/2018   Procedure: DEBRIDEMENT ULCER LEFT FOOT;  Surgeon: [name], MD;  Location: [place];  Service: Orthopedics;  Laterality: Left;  . KNEE ARTHROSCOPY Right ~ 2003   "meniscus repair"  . LIPOMA EXCISION Right 2020   thigh  . SHOULDER OPEN ROTATOR CUFF REPAIR Right 1996; 1998   "w/fracture repair"  . THYROID SURGERY  2000   "removed lots of nodules"  . TONSILLECTOMY  1976    Social History   Socioeconomic History  . Marital status: Single    Spouse name: Not on file  . Number of children: 2  . Years of education: Masters  . Highest education level: Not on file  Occupational History  . Occupation: Landscape architect  Social Needs  . Financial resource strain: Somewhat hard  . Food insecurity    Worry: Sometimes true    Inability: Sometimes true  . Transportation needs    Medical: No    Non-medical: No  Tobacco Use  .  Smoking status: Former Smoker    Packs/day: 0.00    Years: 41.00    Pack years: 0.00     Types: Cigarettes    Quit date: 03/05/2015    Years since quitting: 3.9  . Smokeless tobacco: Never Used  . Tobacco comment: 04/29/2015 "quit smoking cigarettes 02/27/2015"  Substance and Sexual Activity  . Alcohol use: No  . Drug use: No  . Sexual activity: Not Currently    Birth control/protection: Post-menopausal, Surgical  Lifestyle  . Physical activity    Days per week: 0 days    Minutes per session: 0 min  . Stress: Very much  Relationships  . Social connections    Talks on phone: More than three times a week    Gets together: More than three times a week    Attends religious service: More than 4 times per year    Active member of club or organization: No    Attends meetings of clubs or organizations: Never    Relationship status: Never married  Other Topics Concern  . 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      Lives with sister in [place].      Tobacco: 1 ppd x 41 years - quit 2016      Alcohol: none      Drugs: none      Exercise:  Walking in neighborhood; physical job.   Right-handed.   2 cups caffeine daily.    Family History  Problem Relation Age of Onset  . Cancer Mother 72       bronchial cancer  . Breast cancer Mother   . Lung cancer Mother   . Hypertension Father   . COPD Father   . Heart disease Father 12       CAD with cardiac stenting  . Heart attack Father   . Parkinson's disease Father   . Allergies Sister   . Breast cancer Maternal Grandmother   . Emphysema Maternal Grandfather   . Leukemia Paternal Grandmother   . Emphysema Paternal Grandfather   . Thyroid disease Neg Hx   . Colon cancer Neg Hx   . Stomach cancer Neg Hx   . Rectal cancer Neg Hx   . Esophageal cancer Neg Hx    Cardiovascular ROS: positive  for - dyspnea on exertion, edema, irregular heartbeat, palpitations, rapid heart rate and shortness of breath     Objective: There were no vitals filed for this visit.  Physical Exam Constitutional:      General: She is in acute distress.     Appearance: She is diaphoretic.  Cardiovascular:     Rate and Rhythm: Rhythm irregularly irregular. Frequent extrasystoles are present.   Cardiac monitoring In office personally reviewed by myself and Dr. Linna Darner shows Afib with a irregular, irregula rhythm.   Assessment and Plan  1.  Afib--unstable with Oxygen hunger.  -On cardiac monitor and 2L of O2  2.  Rash--stop steroids.Marland KitchenMarland KitchenDomeboro's solution + antifungal  3.  Itchy eyes:  Al[place]-A.       Glyn Ade

## 2019-02-23 NOTE — ED Provider Notes (Signed)
[facility] EMERGENCY DEPARTMENT Provider Note   CSN: [id number] Arrival date & time: 02/11/2019  1442     History   Chief Complaint Chief Complaint  Patient presents with  . Atrial Fibrillation  . Chest Pain    HPI Judith [name] is a 68 y.o. female.     HPI   68 y.o. 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    Reports she mistakenly read the instructions for her endoscopy and discontinued her eliquis 2 days prior to her Cardioversion and found on Monday that she was unable to do it as she had stopped anticoatulation. She has been taking BID since then and has not missed doses.  Had some dyspnea and generalized weakness over the last few weeks but symptoms worsened today. Severe dyspnea, CP, palpitations, and generalized weakness, worse with ambulation.  No fever, cough. Does note some leg swelling as well as abdominal distention and swelling "like I am pregnant."  Chest pain feels like a pressure.     Past Medical History:  Diagnosis Date  . Abnormal EKG 07/31/2013  . Arthritis    "hands" (03/06/2015)  . Asthma   . Carpal tunnel syndrome, bilateral   . Chronic kidney disease   . Complication of anesthesia    slow to wake up. Pt sts she woke up during surgery many years ago.  . Coronary artery disease     2 v CAD with CTO of the RCA and high grade bifurcational LCx/OM stenosis. S/P PCI DES x 2 to the LCx/OM.  Marland Kitchen Diabetic peripheral neuropathy ([place]) "since 1996"  . GERD  (gastroesophageal reflux disease)   . Goiter   . Headache    migraines prior to menopause  . History of shingles 06/01/2013  . Hyperlipidemia LDL goal <70 10/13/2015  . Hypertension   . Hyperthyroidism   . Osteomyelitis of foot (HCC)    Right  . PAF (paroxysmal atrial fibrillation) ([place]) 04/29/2015   CHADS2VASC score of 5 now on Apixaban  . Pneumonia ~ 1976  . Sleep apnea    Bipap  . Tremors of nervous system   . Type II diabetes mellitus (HCC)    insulin dependent    Patient Active Problem List   Diagnosis Date Noted  . Diarrhea 09/05/2018  . Urinary tract infection 06/30/2018  . ICH (intracerebral hemorrhage) ([place]) 06/27/2018  . DKA (diabetic ketoacidoses) ([place]) 06/26/2018  . V-tach ([place]) 05/14/2018  . Seizure ([place]) 05/14/2018  . Slow transit constipation   . Fall   . Labile blood glucose   . Labile blood pressure   . Chronic combined systolic and diastolic congestive heart failure ([place])   . Chronic diastolic congestive heart failure ([place])   . CKD (chronic kidney disease), stage III   . Type 2 diabetes mellitus with peripheral neuropathy (HCC)   . Amputation of right lower extremity below knee ([place]) 04/20/2018  . Unilateral traumatic amputation of right leg below knee with complication, initial encounter ([place])   . Post-operative pain   . PAF (paroxysmal atrial fibrillation) ([place])   .  Duodenal ulcer   . Atrial fibrillation with RVR ([place]) 04/14/2018  . Foot osteomyelitis, right ([place]) 04/14/2018  . Cutaneous abscess of left foot   . Acute respiratory failure with hypoxia ([place])   . Cellulitis 01/02/2018  . LGI bleed   . Acute blood loss anemia   . Wide-complex tachycardia ([place]) 07/04/2017  . Type II diabetes mellitus, uncontrolled ([place]) 07/04/2017  . Peripheral neuropathy 07/04/2017  . H/O hyperthyroidism 07/04/2017  . Atrial fibrillation with rapid ventricular response ([place]) 08/06/2016  . S/P transmetatarsal amputation of foot, right ([place]) 04/19/2016  . Obstructive  sleep apnea 11/26/2015  . Bilateral carpal tunnel syndrome 11/26/2015  . Hypomagnesemia 11/16/2015  . Hyperlipidemia LDL goal <70 10/13/2015  . Abnormality of gait 09/02/2015  . Memory loss 08/12/2015  . Diabetic peripheral neuropathy ([place]) 08/12/2015  . Vitamin D deficiency 08/12/2015  . Hyperthyroidism 04/30/2015  . Heme positive stool   . Persistent atrial fibrillation ([place]) 04/29/2015  . Coronary artery disease with stable angina pectoris ([place]) 03/29/2015  . Abnormal nuclear stress test   . History of goiter 09/28/2014  . GERD (gastroesophageal reflux disease) 07/31/2013  . Depression with anxiety 06/01/2013  . DM (diabetes mellitus), type 2 with peripheral vascular complications ([place]) [id number]  . HTN (hypertension) 01/30/2013    Past Surgical History:  Procedure Laterality Date  . ABDOMINAL HYSTERECTOMY  1988   age 61; CERVICAL DYSPLASIA; ovaries intact.   . AMPUTATION Right 01/23/2016   Procedure: Right 3rd Ray Amputation;  Surgeon: [name], MD;  Location: [facility];  Service: Orthopedics;  Laterality: Right;  . AMPUTATION Right 02/13/2016   Procedure: Right Transmetatarsal Amputation;  Surgeon: [name], MD;  Location: [facility];  Service: Orthopedics;  Laterality: Right;  . AMPUTATION Right 04/15/2018   Procedure: AMPUTATION BELOW KNEE;  Surgeon: [name], MD;  Location: [facility];  Service: Orthopedics;  Laterality: Right;  . BIOPSY  04/16/2018   Procedure: BIOPSY;  Surgeon: Irene Shipper, MD;  Location: [facility] ENDOSCOPY;  Service: Endoscopy;;  . CARDIAC CATHETERIZATION N/A 02/27/2015   Procedure: Left Heart Cath and Coronary Angiography;  Surgeon: [name], MD; LAD 40%, mCFX 80%, OM 70%, RCA 100% calcified       . CARDIAC CATHETERIZATION N/A 03/06/2015   Procedure: Coronary Stent Intervention;  Surgeon: [name], MD;  Location: [facility];  Service: Cardiovascular;  Laterality: N/A;  Mid CX 3.50x12 promus DES w/ 0% resdual and Prox OM1 2.50x20 promus  DES w/ 20% residual  . CARDIOVERSION    . CARDIOVERSION N/A 05/22/2018   Procedure: CARDIOVERSION;  Surgeon: Skeet Latch, MD;  Location: [facility];  Service: Cardiovascular;  Laterality: N/A;  . CARPAL TUNNEL RELEASE Right Nov 2015  . CARPAL TUNNEL RELEASE Right 1992; 05/2014   [name]; [place]  . CESAREAN SECTION  1982; 1984  . ESOPHAGOGASTRODUODENOSCOPY (EGD) WITH PROPOFOL N/A 04/16/2018   Procedure: ESOPHAGOGASTRODUODENOSCOPY (EGD) WITH PROPOFOL;  Surgeon: Irene Shipper, MD;  Location: [facility] ENDOSCOPY;  Service: Endoscopy;  Laterality: N/A;  . FOOT NEUROMA SURGERY Bilateral 2000  . I&D EXTREMITY Left 01/06/2018   Procedure: DEBRIDEMENT ULCER LEFT FOOT;  Surgeon: [name], MD;  Location: [facility];  Service: Orthopedics;  Laterality: Left;  . KNEE ARTHROSCOPY Right ~ 2003   "meniscus repair"  . LIPOMA EXCISION Right 2020   thigh  . SHOULDER OPEN ROTATOR CUFF REPAIR Right 1996; 1998   "w/fracture repair"  . THYROID SURGERY  2000   "removed lots of nodules"  .  TONSILLECTOMY  1976     OB History   No obstetric history on file.      Home Medications    Prior to Admission medications   Medication Sig Start Date End Date Taking? Authorizing Provider  albuterol (PROVENTIL HFA) 108 (90 Base) MCG/ACT inhaler Inhale 1-2 puffs into the lungs every 6 (six) hours as needed for wheezing or shortness of breath. 05/01/18  Yes Angiulli, Lavon Paganini, PA-C  amiodarone (PACERONE) 200 MG tablet Take 1 tablet (200 mg total) by mouth daily. 06/08/18  Yes Sherran Needs, NP  atorvastatin (LIPITOR) 20 MG tablet Take 1 tablet (20 mg total) by mouth daily. 05/01/18  Yes Angiulli, Lavon Paganini, PA-C  buPROPion (WELLBUTRIN) 75 MG tablet Take 75 mg by mouth 2 (two) times daily.   Yes [provider]  cetirizine (ZYRTEC) 10 MG tablet Take 10 mg by mouth daily as needed for allergies.    Yes [provider]  diltiazem (CARTIA XT) 120 MG 24 hr capsule Take 1 capsule (120 mg total) by mouth  daily. 02/12/19  Yes Bhagat, Bhavinkumar, PA  ELIQUIS 5 MG TABS tablet Take 1 tablet by mouth twice daily Patient taking differently: Take 5 mg by mouth 2 (two) times daily.  11/21/18  Yes Turner, Eber Hong, MD  famotidine (PEPCID) 20 MG tablet Take 1 tablet (20 mg total) by mouth 2 (two) times daily. Patient taking differently: Take 40 mg by mouth daily.  06/17/18  Yes Rutherford Guys, MD  ferrous sulfate 325 (65 FE) MG tablet Take 325 mg by mouth daily as needed (Low iron).   Yes [provider]  FLUoxetine (PROZAC) 20 MG capsule TAKE 1 CAPSULE AT BEDTIME Patient taking differently: Take 20 mg by mouth at bedtime.  01/03/19  Yes Rutherford Guys, MD  fluticasone (FLOVENT HFA) 44 MCG/ACT inhaler Inhale 2 puffs into the lungs 2 (two) times daily. Patient taking differently: Inhale 2 puffs into the lungs daily as needed (Allergies).  01/22/19  Yes Rutherford Guys, MD  furosemide (LASIX) 40 MG tablet Take 1 tablet (40 mg total) by mouth daily. 01/18/19  Yes Rutherford Guys, MD  hydrALAZINE (APRESOLINE) 50 MG tablet Take 1 tablet (50 mg total) by mouth 3 (three) times daily. 10/13/18  Yes Isaiah Serge, NP  insulin degludec (TRESIBA FLEXTOUCH) 100 UNIT/ML SOPN FlexTouch Pen Inject 20 Units into the skin at bedtime.    Yes [provider]  ketoconazole (NIZORAL) 2 % cream Apply 1 application topically daily. 02/21/19  Yes Maximiano Coss, NP  loratadine (CLARITIN) 10 MG tablet Take 10 mg by mouth at bedtime.   Yes [provider]  magnesium oxide (MAG-OX) 400 MG tablet Take 400 mg by mouth at bedtime.    Yes [provider]  Melatonin 10 MG TABS Take 10 mg by mouth at bedtime as needed (Sleep).    Yes [provider]  metFORMIN (GLUCOPHAGE-XR) 500 MG 24 hr tablet Take 1,000 mg by mouth daily with breakfast.   Yes [provider]  methimazole (TAPAZOLE) 10 MG tablet TAKE 1 TABLET (10 MG TOTAL) BY MOUTH DAILY. Patient taking differently: Take 10 mg by  mouth daily.  02/15/2019  Yes Renato Shin, MD  metoprolol succinate (TOPROL-XL) 25 MG 24 hr tablet Take 50 mg by mouth 2 (two) times daily.    Yes [provider]  mupirocin ointment (BACTROBAN) 2 % Apply 1 application topically 2 (two) times daily as needed (wound/sores).    Yes [provider]  nitroGLYCERIN (NITROSTAT) 0.4 MG SL tablet Place 0.4 mg under the tongue every 5 (five) minutes x 3 doses as needed for chest pain.    Yes [provider]  polyethylene glycol powder (GLYCOLAX/MIRALAX) 17 GM/SCOOP powder Take 17 g by mouth daily as needed (constipation.).    Yes [provider]  potassium chloride (K-DUR) 10 MEQ tablet Take 1 tablet (10 mEq total) by mouth 2 (two) times daily. Patient taking differently: Take 10 mEq by mouth every other day.  01/18/19  Yes Rutherford Guys, MD  triamcinolone (KENALOG) 0.1 % paste Use as directed 1 application in the mouth or throat 2 (two) times daily as needed (ulcer lesion.). 02/05/19  Yes Rutherford Guys, MD  TRUE METRIX BLOOD GLUCOSE TEST test strip TEST BLOOD SUGAR TWICE DAILY 09/16/18  Yes Renato Shin, MD    Family History Family History  Problem Relation Age of Onset  . Cancer Mother 66       bronchial cancer  . Breast cancer Mother   . Lung cancer Mother   . Hypertension Father   . COPD Father   . Heart disease Father 47       CAD with cardiac stenting  . Heart attack Father   . Parkinson's disease Father   . Allergies Sister   . Breast cancer Maternal Grandmother   . Emphysema Maternal Grandfather   . Leukemia Paternal Grandmother   . Emphysema Paternal Grandfather   . Thyroid disease Neg Hx   . Colon cancer Neg Hx   . Stomach cancer Neg Hx   . Rectal cancer Neg Hx   . Esophageal cancer Neg Hx     Social History Social History   Tobacco Use  . Smoking status: Former Smoker    Packs/day: 0.00    Years: 41.00    Pack years: 0.00    Types: Cigarettes    Quit date: 03/05/2015    Years  since quitting: 3.9  . Smokeless tobacco: Never Used  . Tobacco comment: 04/29/2015 "quit smoking cigarettes 02/27/2015"  Substance Use Topics  . Alcohol use: No  . Drug use: No     Allergies   Contrast media [iodinated diagnostic agents], Novolog [insulin aspart], Codeine, Iodine, Penicillins, Ace inhibitors, Demerol [meperidine], Dilaudid [hydromorphone hcl], Neosporin [neomycin-bacitracin zn-polymyx], Percocet [oxycodone-acetaminophen], and Tape   Review of Systems Review of Systems  Constitutional: Positive for fatigue. Negative for fever.  HENT: Negative for sore throat.   Eyes: Negative for visual disturbance.  Respiratory: Positive for shortness of breath. Negative for cough.   Cardiovascular: Positive for chest pain and leg swelling.  Gastrointestinal: Negative for abdominal pain, nausea and vomiting.  Genitourinary: Negative for difficulty urinating.  Musculoskeletal: Negative for back pain.  Skin: Negative for rash.  Neurological: Positive for light-headedness. Negative for syncope and headaches.     Physical Exam Updated Vital Signs BP 91/72 (BP Location: Right Arm)   Pulse (!) 125   Temp 97.7 F (36.5 C) (Oral)   Resp (!) 22   Ht 5\' 4"  (1.626 m)   Wt 86.1 kg   SpO2 100%   BMI 32.58 kg/m   Physical Exam Vitals signs and nursing note reviewed.  Constitutional:      General: She is not in acute distress.    Appearance: She is well-developed. She is not diaphoretic.  HENT:     Head: Normocephalic and atraumatic.  Eyes:     Conjunctiva/sclera: Conjunctivae normal.  Neck:     Musculoskeletal: Normal  range of motion.  Cardiovascular:     Rate and Rhythm: Tachycardia present. Rhythm irregular.     Heart sounds: Normal heart sounds. No murmur. No friction rub. No gallop.   Pulmonary:     Effort: Pulmonary effort is normal. No respiratory distress.     Breath sounds: Normal breath sounds. No wheezing or rales.  Abdominal:     General: There is no  distension.     Palpations: Abdomen is soft.     Tenderness: There is no abdominal tenderness. There is no guarding.     Comments: Distention/edema over abdomen  Musculoskeletal:        General: No tenderness.     Right lower leg: Edema present.     Left lower leg: Edema present.  Skin:    General: Skin is warm and dry.     Findings: No erythema or rash.  Neurological:     Mental Status: She is alert and oriented to person, place, and time.      ED Treatments / Results  Labs (all labs ordered are listed, but only abnormal results are displayed) Labs Reviewed  CBC WITH DIFFERENTIAL/PLATELET - Abnormal; Notable for the following components:      Result Value   WBC 10.8 (*)    RBC 3.75 (*)    Hemoglobin 10.1 (*)    HCT 32.3 (*)    RDW 15.8 (*)    nRBC 0.9 (*)    Neutro Abs 8.1 (*)    Monocytes Absolute 1.2 (*)    All other components within normal limits  COMPREHENSIVE METABOLIC PANEL - Abnormal; Notable for the following components:   Sodium 129 (*)    Chloride 89 (*)    BUN 31 (*)    Creatinine, Ser 1.37 (*)    Albumin 3.3 (*)    AST 93 (*)    ALT 149 (*)    Alkaline Phosphatase 153 (*)    GFR calc non Af Amer 40 (*)    GFR calc Af Amer 46 (*)    All other components within normal limits  BRAIN NATRIURETIC PEPTIDE - Abnormal; Notable for the following components:   B Natriuretic Peptide 1,119.0 (*)    All other components within normal limits  T4, FREE - Abnormal; Notable for the following components:   Free T4 2.76 (*)    All other components within normal limits  TSH - Abnormal; Notable for the following components:   TSH 0.098 (*)    All other components within normal limits  PROTIME-INR - Abnormal; Notable for the following components:   Prothrombin Time 22.5 (*)    INR 2.0 (*)    All other components within normal limits  HEMOGLOBIN A1C - Abnormal; Notable for the following components:   Hgb A1c MFr Bld 8.1 (*)    All other components within normal  limits  TROPONIN I (HIGH SENSITIVITY) - Abnormal; Notable for the following components:   Troponin I (High Sensitivity) 375 (*)    All other components within normal limits  TROPONIN I (HIGH SENSITIVITY) - Abnormal; Notable for the following components:   Troponin I (High Sensitivity) 197 (*)    All other components within normal limits  TROPONIN I (HIGH SENSITIVITY) - Abnormal; Notable for the following components:   Troponin I (High Sensitivity) 476 (*)    All other components within normal limits  TROPONIN I (HIGH SENSITIVITY) - Abnormal; Notable for the following components:   Troponin I (High Sensitivity) 548 (*)  All other components within normal limits  SARS CORONAVIRUS 2 BY RT PCR (HOSPITAL ORDER, [facility])  HIV ANTIBODY (ROUTINE TESTING W REFLEX)  MAGNESIUM  HIV4GL SAVE TUBE  LIPID PANEL  BASIC METABOLIC PANEL  CBC  PROTIME-INR    EKG EKG Interpretation  Date/Time:  Friday February 23 2019 14:43:11 EDT Ventricular Rate:  156 PR Interval:    QRS Duration: 109 QT Interval:  307 QTC Calculation: 495 R Axis:   169 Text Interpretation:  Atrial fibrillation with RVR Anterior infarct, old Repolarization abnormality, prob rate related Since prior ECG, rate has increased Confirmed by Gareth Morgan [phone]) on 03/04/2019 5:00:05 PM   Radiology Dg Chest Portable 1 View  Result Date: 03/09/2019 CLINICAL DATA:  AFib.  Chest pain.  Shortness of breath. EXAM: PORTABLE CHEST 1 VIEW COMPARISON:  Chest x-ray 01/18/2019. FINDINGS: Mediastinum and hilar structures normal. Cardiomegaly with normal pulmonary vascularity. No focal infiltrate. No pleural effusion noted on today's exam. Previously identified right pleural effusion has cleared. IMPRESSION: 1. Cardiomegaly. No pulmonary venous congestion. No pleural effusion noted on today's exam. Previously identified right pleural effusion has cleared. 2.  No acute pulmonary disease. Electronically Signed    By: Marcello Moores  Register   On: 02/24/2019 15:19    Procedures .Critical Care Performed by: Gareth Morgan, MD Authorized by: Gareth Morgan, MD   Critical care provider statement:    Critical care time (minutes):  40   Critical care was time spent personally by me on the following activities:  Discussions with consultants, evaluation of patient's response to treatment, examination of patient, ordering and performing treatments and interventions, ordering and review of laboratory studies, ordering and review of radiographic studies, pulse oximetry, obtaining history from patient or surrogate and review of old charts   (including critical care time)  Medications Ordered in ED Medications  diltiazem (CARDIZEM) 1 mg/mL load via infusion 10 mg (10 mg Intravenous Bolus from Bag 02/28/2019 1521)    And  diltiazem (CARDIZEM) 125 mg in dextrose 5% 125 mL (1 mg/mL) infusion (5 mg/hr Intravenous Transfusing/Transfer 02/28/2019 1830)  nitroGLYCERIN (NITROSTAT) SL tablet 0.4 mg (0.4 mg Sublingual Given 03/08/2019 1737)  atorvastatin (LIPITOR) tablet 20 mg (has no administration in time range)  hydrALAZINE (APRESOLINE) tablet 50 mg (50 mg Oral Not Given 03/03/2019 1748)  metoprolol succinate (TOPROL-XL) 24 hr tablet 50 mg (has no administration in time range)  buPROPion (WELLBUTRIN) tablet 75 mg (has no administration in time range)  FLUoxetine (PROZAC) capsule 20 mg (has no administration in time range)  methimazole (TAPAZOLE) tablet 10 mg (has no administration in time range)  famotidine (PEPCID) tablet 20 mg (has no administration in time range)  polyethylene glycol (MIRALAX / GLYCOLAX) packet 17 g (has no administration in time range)  apixaban (ELIQUIS) tablet 5 mg (has no administration in time range)  ferrous sulfate tablet 325 mg (has no administration in time range)  Melatonin TABS 10.5 mg (has no administration in time range)  albuterol (PROVENTIL) (2.5 MG/3ML) 0.083% nebulizer solution 3 mL (has  no administration in time range)  loratadine (CLARITIN) tablet 10 mg (has no administration in time range)  triamcinolone (KENALOG) 0.1 % paste 1 application (has no administration in time range)  acetaminophen (TYLENOL) tablet 650 mg (has no administration in time range)  ondansetron (ZOFRAN) injection 4 mg (has no administration in time range)  insulin glargine (LANTUS) injection 15 Units (has no administration in time range)  insulin aspart (novoLOG) injection 0-15 Units (has no administration in  time range)  insulin aspart (novoLOG) injection 0-5 Units (has no administration in time range)  amiodarone (NEXTERONE) 1.8 mg/mL load via infusion 150 mg (150 mg Intravenous Bolus from Bag 03/04/2019 1940)    Followed by  amiodarone (NEXTERONE PREMIX) 360-4.14 MG/200ML-% (1.8 mg/mL) IV infusion (60 mg/hr Intravenous New Bag/Given 02/12/2019 1939)    Followed by  amiodarone (NEXTERONE PREMIX) 360-4.14 MG/200ML-% (1.8 mg/mL) IV infusion (has no administration in time range)  magnesium oxide (MAG-OX) tablet 400 mg (has no administration in time range)  budesonide (PULMICORT) nebulizer solution 0.25 mg (0.25 mg Nebulization Given 02/19/2019 2008)  sodium chloride flush (NS) 0.9 % injection 3 mL (3 mLs Intravenous Given 03/06/2019 1508)  furosemide (LASIX) injection 40 mg (40 mg Intravenous Given 02/20/2019 1830)     Initial Impression / Assessment and Plan / ED Course  I have reviewed the triage vital signs and the nursing notes.  Pertinent labs & imaging results that were available during my care of the patient were reviewed by me and considered in my medical decision making (see chart for details).        68 y.o. 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  CHADS-Vasc 5.   Patient in atrial fibrillation with RVR, blood pressures and mentation stable on arrival.   Ordered diltiazem bolus and gtt and labs.  Consulted Cardiology and plan on hospitalist admission.   Labs returned showing elevated troponin and BNP likely demand ischemia and heart failure, decreased TSH with concern for hyperthyroidism contributing.  Cardiology to see, signed out to oncoming ED provider with hospitalist consult for admission pending.   Final Clinical Impressions(s) / ED Diagnoses   Final diagnoses:  Atrial fibrillation with RVR ([place])  Hyperthyroidism  Elevated troponin    ED Discharge Orders         Ordered    Amb referral to AFIB Clinic     02/09/2019 1727    Amb referral to HF Clinic     02/20/2019 [place], Conall Vangorder, MD 02/22/2019 2118

## 2019-02-23 NOTE — ED Notes (Signed)
Dr. Billy Fischer informed of patient's troponin of 375. No new verbal orders received.

## 2019-02-24 ENCOUNTER — Inpatient Hospital Stay (HOSPITAL_COMMUNITY): Payer: Medicare PPO | Admitting: Anesthesiology

## 2019-02-24 ENCOUNTER — Encounter (HOSPITAL_COMMUNITY): Payer: Self-pay | Admitting: Anesthesiology

## 2019-02-24 ENCOUNTER — Encounter (HOSPITAL_COMMUNITY): Admission: EM | Disposition: E | Payer: Self-pay | Source: Home / Self Care | Attending: Internal Medicine

## 2019-02-24 ENCOUNTER — Inpatient Hospital Stay (HOSPITAL_COMMUNITY): Payer: Medicare PPO

## 2019-02-24 DIAGNOSIS — I248 Other forms of acute ischemic heart disease: Secondary | ICD-10-CM

## 2019-02-24 DIAGNOSIS — I25118 Atherosclerotic heart disease of native coronary artery with other forms of angina pectoris: Secondary | ICD-10-CM

## 2019-02-24 DIAGNOSIS — E872 Acidosis: Secondary | ICD-10-CM | POA: Diagnosis not present

## 2019-02-24 DIAGNOSIS — I361 Nonrheumatic tricuspid (valve) insufficiency: Secondary | ICD-10-CM

## 2019-02-24 DIAGNOSIS — I5021 Acute systolic (congestive) heart failure: Secondary | ICD-10-CM | POA: Diagnosis not present

## 2019-02-24 DIAGNOSIS — R570 Cardiogenic shock: Secondary | ICD-10-CM | POA: Diagnosis not present

## 2019-02-24 DIAGNOSIS — I484 Atypical atrial flutter: Secondary | ICD-10-CM | POA: Diagnosis not present

## 2019-02-24 DIAGNOSIS — I4819 Other persistent atrial fibrillation: Secondary | ICD-10-CM | POA: Diagnosis not present

## 2019-02-24 DIAGNOSIS — I10 Essential (primary) hypertension: Secondary | ICD-10-CM | POA: Diagnosis not present

## 2019-02-24 DIAGNOSIS — E0591 Thyrotoxicosis, unspecified with thyrotoxic crisis or storm: Secondary | ICD-10-CM

## 2019-02-24 HISTORY — PX: CARDIOVERSION: SHX1299

## 2019-02-24 LAB — GLUCOSE, CAPILLARY
Glucose-Capillary: 206 mg/dL — ABNORMAL HIGH (ref 70–99)
Glucose-Capillary: 111 mg/dL — ABNORMAL HIGH (ref 70–99)
Glucose-Capillary: 99 mg/dL (ref 70–99)
Glucose-Capillary: 84 mg/dL (ref 70–99)
Glucose-Capillary: 87 mg/dL (ref 70–99)
Glucose-Capillary: 122 mg/dL — ABNORMAL HIGH (ref 70–99)

## 2019-02-24 LAB — CBC
HCT: 31.8 % — ABNORMAL LOW (ref 36.0–46.0)
HCT: 33 % — ABNORMAL LOW (ref 36.0–46.0)
Hemoglobin: 9.9 g/dL — ABNORMAL LOW (ref 12.0–15.0)
Hemoglobin: 10.1 g/dL — ABNORMAL LOW (ref 12.0–15.0)
MCH: 26.7 pg (ref 26.0–34.0)
MCH: 26.5 pg (ref 26.0–34.0)
MCHC: 31.1 g/dL (ref 30.0–36.0)
MCHC: 30.6 g/dL (ref 30.0–36.0)
MCV: 85.7 fL (ref 80.0–100.0)
MCV: 86.6 fL (ref 80.0–100.0)
Platelets: 353 10*3/uL (ref 150–400)
Platelets: 357 10*3/uL (ref 150–400)
RBC: 3.71 MIL/uL — ABNORMAL LOW (ref 3.87–5.11)
RBC: 3.81 MIL/uL — ABNORMAL LOW (ref 3.87–5.11)
RDW: 15.9 % — ABNORMAL HIGH (ref 11.5–15.5)
RDW: 16.2 % — ABNORMAL HIGH (ref 11.5–15.5)
WBC: 10.1 10*3/uL (ref 4.0–10.5)
WBC: 14.5 10*3/uL — ABNORMAL HIGH (ref 4.0–10.5)
nRBC: 1.3 % — ABNORMAL HIGH (ref 0.0–0.2)
nRBC: 1.3 % — ABNORMAL HIGH (ref 0.0–0.2)

## 2019-02-24 LAB — HEPATIC FUNCTION PANEL
ALT: 234 U/L — ABNORMAL HIGH (ref 0–44)
AST: 315 U/L — ABNORMAL HIGH (ref 15–41)
Albumin: 3 g/dL — ABNORMAL LOW (ref 3.5–5.0)
Alkaline Phosphatase: 144 U/L — ABNORMAL HIGH (ref 38–126)
Bilirubin, Direct: 0.3 mg/dL — ABNORMAL HIGH (ref 0.0–0.2)
Indirect Bilirubin: 1 mg/dL — ABNORMAL HIGH (ref 0.3–0.9)
Total Bilirubin: 1.3 mg/dL — ABNORMAL HIGH (ref 0.3–1.2)
Total Protein: 6.1 g/dL — ABNORMAL LOW (ref 6.5–8.1)

## 2019-02-24 LAB — POCT I-STAT 7, (LYTES, BLD GAS, ICA,H+H)
Acid-Base Excess: 2 mmol/L (ref 0.0–2.0)
Acid-base deficit: 5 mmol/L — ABNORMAL HIGH (ref 0.0–2.0)
Bicarbonate: 21.1 mmol/L (ref 20.0–28.0)
Bicarbonate: 25.6 mmol/L (ref 20.0–28.0)
Bicarbonate: 26.6 mmol/L (ref 20.0–28.0)
Calcium, Ion: 1.1 mmol/L — ABNORMAL LOW (ref 1.15–1.40)
Calcium, Ion: 1.05 mmol/L — ABNORMAL LOW (ref 1.15–1.40)
Calcium, Ion: 1.1 mmol/L — ABNORMAL LOW (ref 1.15–1.40)
HCT: 32 % — ABNORMAL LOW (ref 36.0–46.0)
HCT: 32 % — ABNORMAL LOW (ref 36.0–46.0)
HCT: 33 % — ABNORMAL LOW (ref 36.0–46.0)
Hemoglobin: 10.9 g/dL — ABNORMAL LOW (ref 12.0–15.0)
Hemoglobin: 10.9 g/dL — ABNORMAL LOW (ref 12.0–15.0)
Hemoglobin: 11.2 g/dL — ABNORMAL LOW (ref 12.0–15.0)
O2 Saturation: 100 %
O2 Saturation: 98 %
O2 Saturation: 99 %
Patient temperature: 97.84
Patient temperature: 97.8
Potassium: 5 mmol/L (ref 3.5–5.1)
Potassium: 4.8 mmol/L (ref 3.5–5.1)
Potassium: 4.6 mmol/L (ref 3.5–5.1)
Sodium: 128 mmol/L — ABNORMAL LOW (ref 135–145)
Sodium: 129 mmol/L — ABNORMAL LOW (ref 135–145)
Sodium: 131 mmol/L — ABNORMAL LOW (ref 135–145)
TCO2: 22 mmol/L (ref 22–32)
TCO2: 27 mmol/L (ref 22–32)
TCO2: 28 mmol/L (ref 22–32)
pCO2 arterial: 41.6 mmHg (ref 32.0–48.0)
pCO2 arterial: 44.6 mmHg (ref 32.0–48.0)
pCO2 arterial: 40.1 mmHg (ref 32.0–48.0)
pH, Arterial: 7.312 — ABNORMAL LOW (ref 7.350–7.450)
pH, Arterial: 7.365 (ref 7.350–7.450)
pH, Arterial: 7.429 (ref 7.350–7.450)
pO2, Arterial: 336 mmHg — ABNORMAL HIGH (ref 83.0–108.0)
pO2, Arterial: 112 mmHg — ABNORMAL HIGH (ref 83.0–108.0)
pO2, Arterial: 116 mmHg — ABNORMAL HIGH (ref 83.0–108.0)

## 2019-02-24 LAB — BASIC METABOLIC PANEL
Anion gap: 15 (ref 5–15)
Anion gap: 20 — ABNORMAL HIGH (ref 5–15)
BUN: 33 mg/dL — ABNORMAL HIGH (ref 8–23)
BUN: 39 mg/dL — ABNORMAL HIGH (ref 8–23)
CO2: 22 mmol/L (ref 22–32)
CO2: 17 mmol/L — ABNORMAL LOW (ref 22–32)
Calcium: 9.1 mg/dL (ref 8.9–10.3)
Calcium: 8.6 mg/dL — ABNORMAL LOW (ref 8.9–10.3)
Chloride: 91 mmol/L — ABNORMAL LOW (ref 98–111)
Chloride: 92 mmol/L — ABNORMAL LOW (ref 98–111)
Creatinine, Ser: 1.63 mg/dL — ABNORMAL HIGH (ref 0.44–1.00)
Creatinine, Ser: 2.29 mg/dL — ABNORMAL HIGH (ref 0.44–1.00)
GFR calc Af Amer: 38 mL/min — ABNORMAL LOW (ref >60)
GFR calc Af Amer: 25 mL/min — ABNORMAL LOW (ref >60)
GFR calc non Af Amer: 32 mL/min — ABNORMAL LOW (ref >60)
GFR calc non Af Amer: 22 mL/min — ABNORMAL LOW (ref >60)
Glucose, Bld: 130 mg/dL — ABNORMAL HIGH (ref 70–99)
Glucose, Bld: 87 mg/dL (ref 70–99)
Potassium: 4.7 mmol/L (ref 3.5–5.1)
Potassium: 5.1 mmol/L (ref 3.5–5.1)
Sodium: 128 mmol/L — ABNORMAL LOW (ref 135–145)
Sodium: 129 mmol/L — ABNORMAL LOW (ref 135–145)

## 2019-02-24 LAB — COOXEMETRY PANEL
Carboxyhemoglobin: 1 % (ref 0.5–1.5)
Methemoglobin: 0.8 % (ref 0.0–1.5)
O2 Saturation: 62.9 %
Total hemoglobin: 10.3 g/dL — ABNORMAL LOW (ref 12.0–16.0)

## 2019-02-24 LAB — PROTIME-INR
INR: 3.2 — ABNORMAL HIGH (ref 0.8–1.2)
INR: 6.5 (ref 0.8–1.2)
Prothrombin Time: 32.2 seconds — ABNORMAL HIGH (ref 11.4–15.2)
Prothrombin Time: 55.8 seconds — ABNORMAL HIGH (ref 11.4–15.2)

## 2019-02-24 LAB — LACTIC ACID, PLASMA
Lactic Acid, Venous: 5.7 mmol/L (ref 0.5–1.9)
Lactic Acid, Venous: 3 mmol/L (ref 0.5–1.9)

## 2019-02-24 LAB — HEPATITIS PANEL, ACUTE
HCV Ab: NONREACTIVE
Hep A IgM: NONREACTIVE
Hep B C IgM: NONREACTIVE
Hepatitis B Surface Ag: NONREACTIVE

## 2019-02-24 LAB — LIPID PANEL
Cholesterol: 67 mg/dL (ref 0–200)
HDL: 17 mg/dL — ABNORMAL LOW (ref >40)
LDL Cholesterol: 35 mg/dL (ref 0–99)
Total CHOL/HDL Ratio: 3.9 RATIO
Triglycerides: 76 mg/dL (ref <150)
VLDL: 15 mg/dL (ref 0–40)

## 2019-02-24 LAB — ECHOCARDIOGRAM COMPLETE
Height: 64 in
Weight: 2864.22 oz

## 2019-02-24 LAB — PHOSPHORUS: Phosphorus: 5.7 mg/dL — ABNORMAL HIGH (ref 2.5–4.6)

## 2019-02-24 LAB — MRSA PCR SCREENING: MRSA by PCR: NEGATIVE

## 2019-02-24 LAB — MAGNESIUM: Magnesium: 2 mg/dL (ref 1.7–2.4)

## 2019-02-24 SURGERY — CARDIOVERSION
Anesthesia: General

## 2019-02-24 MED ORDER — MIDAZOLAM HCL 2 MG/2ML IJ SOLN
INTRAMUSCULAR | Status: AC
  Administered 2019-02-24: 16:00:00 1 mg via INTRAVENOUS
  Filled 2019-02-24: qty 2

## 2019-02-24 MED ORDER — CHLORHEXIDINE GLUCONATE 0.12% ORAL RINSE (MEDLINE KIT)
15.0000 mL | Freq: Two times a day (BID) | OROMUCOSAL | Status: DC
  Administered 2019-02-24 – 2019-03-07 (×22): 15 mL via OROMUCOSAL

## 2019-02-24 MED ORDER — MIDAZOLAM HCL 2 MG/2ML IJ SOLN
1.0000 mg | INTRAMUSCULAR | Status: DC | PRN
  Administered 2019-02-24 – 2019-03-03 (×4): 1 mg via INTRAVENOUS
  Filled 2019-02-24 (×11): qty 2

## 2019-02-24 MED ORDER — METHIMAZOLE 10 MG PO TABS
10.0000 mg | ORAL_TABLET | Freq: Two times a day (BID) | ORAL | Status: DC
  Administered 2019-02-24 – 2019-03-05 (×18): 10 mg
  Filled 2019-02-24 (×22): qty 1

## 2019-02-24 MED ORDER — PHENYLEPHRINE HCL-NACL 10-0.9 MG/250ML-% IV SOLN
INTRAVENOUS | Status: AC
  Administered 2019-02-24: 100 mg via INTRAVENOUS
  Filled 2019-02-24: qty 250

## 2019-02-24 MED ORDER — MIDAZOLAM HCL 2 MG/2ML IJ SOLN
INTRAMUSCULAR | Status: AC
  Administered 2019-02-24: 2 mg
  Filled 2019-02-24: qty 2

## 2019-02-24 MED ORDER — MIDAZOLAM HCL 2 MG/2ML IJ SOLN
1.0000 mg | INTRAMUSCULAR | Status: DC | PRN
  Administered 2019-02-26 – 2019-03-02 (×8): 1 mg via INTRAVENOUS
  Filled 2019-02-24 (×3): qty 2

## 2019-02-24 MED ORDER — SODIUM CHLORIDE 0.9 % IV SOLN
INTRAVENOUS | Status: DC | PRN
  Administered 2019-02-24: 15:00:00 via INTRAVENOUS

## 2019-02-24 MED ORDER — EPHEDRINE SULFATE 50 MG/ML IJ SOLN
INTRAMUSCULAR | Status: DC | PRN
  Administered 2019-02-24: 15 mg via INTRAVENOUS

## 2019-02-24 MED ORDER — MIDAZOLAM HCL 2 MG/2ML IJ SOLN
1.0000 mg | INTRAMUSCULAR | Status: AC | PRN
  Administered 2019-02-24 (×3): 1 mg via INTRAVENOUS
  Filled 2019-02-24 (×2): qty 2

## 2019-02-24 MED ORDER — PHENYLEPHRINE HCL (PRESSORS) 10 MG/ML IV SOLN
INTRAVENOUS | Status: DC | PRN
  Administered 2019-02-24: 80 ug via INTRAVENOUS

## 2019-02-24 MED ORDER — DOCUSATE SODIUM 50 MG/5ML PO LIQD
100.0000 mg | Freq: Two times a day (BID) | ORAL | Status: DC | PRN
  Administered 2019-02-27: 100 mg
  Filled 2019-02-24: qty 10

## 2019-02-24 MED ORDER — LORATADINE 10 MG PO TABS
10.0000 mg | ORAL_TABLET | Freq: Every day | ORAL | Status: DC
  Administered 2019-02-24 – 2019-03-04 (×9): 10 mg
  Filled 2019-02-24 (×9): qty 1

## 2019-02-24 MED ORDER — SODIUM BICARBONATE 8.4 % IV SOLN
INTRAVENOUS | Status: AC
  Filled 2019-02-24: qty 100

## 2019-02-24 MED ORDER — DOPAMINE-DEXTROSE 3.2-5 MG/ML-% IV SOLN
INTRAVENOUS | Status: DC | PRN
  Administered 2019-02-24: 5 ug/kg/min via INTRAVENOUS

## 2019-02-24 MED ORDER — SODIUM CHLORIDE 0.9% FLUSH: 10.0000 mL | INTRAVENOUS | Status: DC | PRN

## 2019-02-24 MED ORDER — SODIUM CHLORIDE 0.9% FLUSH
10.0000 mL | Freq: Two times a day (BID) | INTRAVENOUS | Status: DC
  Administered 2019-02-24 – 2019-03-05 (×14): 10 mL

## 2019-02-24 MED ORDER — "THROMBI-PAD 3""X3"" EX PADS"
1.0000 | MEDICATED_PAD | Freq: Once | CUTANEOUS | Status: DC
  Filled 2019-02-24: qty 1

## 2019-02-24 MED ORDER — FLUOXETINE HCL 20 MG/5ML PO SOLN
20.0000 mg | Freq: Every day | ORAL | Status: DC
  Administered 2019-02-24 – 2019-03-04 (×9): 20 mg
  Filled 2019-02-24 (×11): qty 5

## 2019-02-24 MED ORDER — FUROSEMIDE 10 MG/ML IJ SOLN
INTRAMUSCULAR | Status: AC
  Filled 2019-02-24: qty 4

## 2019-02-24 MED ORDER — MIDAZOLAM HCL 2 MG/2ML IJ SOLN
1.0000 mg | Freq: Once | INTRAMUSCULAR | Status: AC
  Administered 2019-02-24: 16:00:00 1 mg via INTRAVENOUS

## 2019-02-24 MED ORDER — FUROSEMIDE 10 MG/ML IJ SOLN
80.0000 mg | Freq: Once | INTRAMUSCULAR | Status: AC
  Administered 2019-02-24: 80 mg via INTRAVENOUS
  Filled 2019-02-24: qty 8

## 2019-02-24 MED ORDER — FENTANYL 2500MCG IN NS 250ML (10MCG/ML) PREMIX INFUSION
25.0000 ug/h | INTRAVENOUS | Status: DC
  Administered 2019-02-24: 50 ug/h via INTRAVENOUS
  Administered 2019-02-25: 150 ug/h via INTRAVENOUS
  Administered 2019-02-27: 125 ug/h via INTRAVENOUS
  Filled 2019-02-24 (×3): qty 250

## 2019-02-24 MED ORDER — FENTANYL BOLUS VIA INFUSION
25.0000 ug | INTRAVENOUS | Status: DC | PRN
  Administered 2019-02-24 – 2019-03-02 (×10): 25 ug via INTRAVENOUS
  Filled 2019-02-24: qty 25

## 2019-02-24 MED ORDER — PROPOFOL 10 MG/ML IV BOLUS
INTRAVENOUS | Status: DC | PRN
  Administered 2019-02-24: 60 mg via INTRAVENOUS
  Administered 2019-02-24: 40 mg via INTRAVENOUS

## 2019-02-24 MED ORDER — DOPAMINE-DEXTROSE 3.2-5 MG/ML-% IV SOLN
INTRAVENOUS | Status: AC
  Filled 2019-02-24: qty 250

## 2019-02-24 MED ORDER — SODIUM BICARBONATE 8.4 % IV SOLN
100.0000 meq | Freq: Once | INTRAVENOUS | Status: AC
  Administered 2019-02-24: 100 meq via INTRAVENOUS

## 2019-02-24 MED ORDER — EPINEPHRINE 1 MG/10ML IJ SOSY
PREFILLED_SYRINGE | INTRAMUSCULAR | Status: DC | PRN
  Administered 2019-02-24 (×3): 0.1 mg via INTRAVENOUS

## 2019-02-24 MED ORDER — DOPAMINE-DEXTROSE 3.2-5 MG/ML-% IV SOLN: 5.0000 ug/kg/min | INTRAVENOUS | Status: DC

## 2019-02-24 MED ORDER — BISACODYL 10 MG RE SUPP: 10.0000 mg | Freq: Every day | RECTAL | Status: DC | PRN

## 2019-02-24 MED ORDER — FENTANYL CITRATE (PF) 100 MCG/2ML IJ SOLN
25.0000 ug | Freq: Once | INTRAMUSCULAR | Status: AC
  Administered 2019-02-24: 25 ug via INTRAVENOUS

## 2019-02-24 MED ORDER — APIXABAN 5 MG PO TABS
5.0000 mg | ORAL_TABLET | Freq: Two times a day (BID) | ORAL | Status: DC
  Administered 2019-02-24: 5 mg
  Filled 2019-02-24: qty 1

## 2019-02-24 MED ORDER — FAMOTIDINE 20 MG PO TABS
20.0000 mg | ORAL_TABLET | Freq: Every day | ORAL | Status: DC
  Administered 2019-02-25 – 2019-03-05 (×9): 20 mg
  Filled 2019-02-24 (×9): qty 1

## 2019-02-24 MED ORDER — FENTANYL CITRATE (PF) 100 MCG/2ML IJ SOLN
50.0000 ug | Freq: Once | INTRAMUSCULAR | Status: AC
  Administered 2019-02-24: 16:00:00 50 ug via INTRAVENOUS

## 2019-02-24 MED ORDER — METOPROLOL SUCCINATE ER 50 MG PO TB24
75.0000 mg | ORAL_TABLET | Freq: Two times a day (BID) | ORAL | Status: DC
  Administered 2019-02-24: 75 mg via ORAL
  Filled 2019-02-24: qty 1

## 2019-02-24 MED ORDER — FENTANYL CITRATE (PF) 100 MCG/2ML IJ SOLN
50.0000 ug | INTRAMUSCULAR | Status: DC | PRN
  Administered 2019-02-24 – 2019-03-01 (×9): 50 ug via INTRAVENOUS
  Filled 2019-02-24 (×6): qty 2

## 2019-02-24 MED ORDER — SODIUM CHLORIDE 0.9 % IV SOLN
INTRAVENOUS | Status: DC | PRN
  Administered 2019-02-24: 75 ug/min via INTRAVENOUS

## 2019-02-24 MED ORDER — ATORVASTATIN CALCIUM 10 MG PO TABS
20.0000 mg | ORAL_TABLET | Freq: Every day | ORAL | Status: DC
  Administered 2019-02-25 – 2019-03-05 (×9): 20 mg
  Filled 2019-02-24 (×9): qty 2

## 2019-02-24 MED ORDER — FENTANYL CITRATE (PF) 100 MCG/2ML IJ SOLN
INTRAMUSCULAR | Status: AC
  Administered 2019-02-24: 100 ug
  Filled 2019-02-24: qty 2

## 2019-02-24 MED ORDER — BUPROPION HCL 75 MG PO TABS
75.0000 mg | ORAL_TABLET | Freq: Two times a day (BID) | ORAL | Status: DC
  Administered 2019-02-24 – 2019-03-05 (×18): 75 mg
  Filled 2019-02-24 (×23): qty 1

## 2019-02-24 MED ORDER — CHLORHEXIDINE GLUCONATE CLOTH 2 % EX PADS
6.0000 | MEDICATED_PAD | Freq: Every day | CUTANEOUS | Status: DC
  Administered 2019-02-24 – 2019-02-25 (×3): 6 via TOPICAL

## 2019-02-24 MED ORDER — LIDOCAINE 2% (20 MG/ML) 5 ML SYRINGE
INTRAMUSCULAR | Status: DC | PRN
  Administered 2019-02-24: 40 mg via INTRAVENOUS

## 2019-02-24 MED ORDER — SODIUM BICARBONATE-DEXTROSE 150-5 MEQ/L-% IV SOLN
150.0000 meq | INTRAVENOUS | Status: DC
  Administered 2019-02-24: 150 meq via INTRAVENOUS
  Filled 2019-02-24: qty 1000

## 2019-02-24 MED ORDER — PHENYLEPHRINE HCL-NACL 10-0.9 MG/250ML-% IV SOLN
0.0000 ug/min | INTRAVENOUS | Status: DC
  Administered 2019-02-24 (×2): 100 mg via INTRAVENOUS
  Administered 2019-02-24: 100 ug/min via INTRAVENOUS

## 2019-02-24 MED ORDER — FENTANYL CITRATE (PF) 100 MCG/2ML IJ SOLN
INTRAMUSCULAR | Status: AC
  Administered 2019-02-24: 50 ug via INTRAVENOUS
  Filled 2019-02-24: qty 2

## 2019-02-24 MED ORDER — ORAL CARE MOUTH RINSE
15.0000 mL | OROMUCOSAL | Status: DC
  Administered 2019-02-25 – 2019-03-07 (×103): 15 mL via OROMUCOSAL

## 2019-02-24 MED ORDER — FENTANYL CITRATE (PF) 100 MCG/2ML IJ SOLN
INTRAMUSCULAR | Status: AC
  Administered 2019-02-24: 18:00:00 100 ug
  Filled 2019-02-24: qty 2

## 2019-02-24 MED ORDER — MAGNESIUM OXIDE 400 (241.3 MG) MG PO TABS
400.0000 mg | ORAL_TABLET | Freq: Every day | ORAL | Status: DC
  Administered 2019-02-24 – 2019-03-04 (×9): 400 mg
  Filled 2019-02-24 (×9): qty 1

## 2019-02-24 MED ORDER — MIDAZOLAM 50MG/50ML (1MG/ML) PREMIX INFUSION
0.0000 mg/h | INTRAVENOUS | Status: DC
  Administered 2019-02-24: 1 mg/h via INTRAVENOUS
  Administered 2019-02-25: 2 mg/h via INTRAVENOUS
  Administered 2019-02-26: 3 mg/h via INTRAVENOUS
  Filled 2019-02-24 (×3): qty 50

## 2019-02-24 NOTE — H&P (View-Only) (Signed)
ELECTROPHYSIOLOGY CONSULT NOTE    Primary Care Physician: Rutherford Guys, MD Referring Physician:  Dr Sallyanne Kuster  Admit Date: 02/24/2019  Reason for consultation:  afib with RVR  Judith [name] is a 68 y.o. female with a h/o refractory atrial fibrillation.  She is now admitted with worsening CHF in the setting of her AF.  She has been placed on amiodarone but remains in afib.  She has developed rapidly conduction/ atypical atrial flutter with aberrancy today.  In the setting of persistent RVR, her EF has declined.  Today, she has become weak and lethargic.  + nausea and poor appetite.  EP is consulted for further evaluation.    Past Medical History:  Diagnosis Date  . Abnormal EKG 07/31/2013  . Arthritis    "hands" (03/06/2015)  . Asthma   . Carpal tunnel syndrome, bilateral   . Chronic kidney disease   . Complication of anesthesia    slow to wake up. Pt sts she woke up during surgery many years ago.  . Coronary artery disease     2 v CAD with CTO of the RCA and high grade bifurcational LCx/OM stenosis. S/P PCI DES x 2 to the LCx/OM.  Marland Kitchen Diabetic peripheral neuropathy ([place]) "since 1996"  . GERD (gastroesophageal reflux disease)   . Goiter   . Headache    migraines prior to menopause  . History of shingles 06/01/2013  . Hyperlipidemia LDL goal <70 10/13/2015  . Hypertension   . Hyperthyroidism   . Osteomyelitis of foot (HCC)    Right  . PAF (paroxysmal atrial fibrillation) ([place]) 04/29/2015   CHADS2VASC score of 5 now on Apixaban  . Pneumonia ~ 1976  . Sleep apnea    Bipap  . Tremors of nervous system   . Type II diabetes mellitus (HCC)    insulin dependent   Past Surgical History:  Procedure Laterality Date  . ABDOMINAL HYSTERECTOMY  1988   age 63; CERVICAL DYSPLASIA; ovaries intact.   . AMPUTATION Right 01/23/2016   Procedure: Right 3rd Ray Amputation;  Surgeon: [name], MD;  Location: [place];  Service: Orthopedics;  Laterality: Right;  . AMPUTATION Right 02/13/2016    Procedure: Right Transmetatarsal Amputation;  Surgeon: [name], MD;  Location: [place];  Service: Orthopedics;  Laterality: Right;  . AMPUTATION Right 04/15/2018   Procedure: AMPUTATION BELOW KNEE;  Surgeon: [name], MD;  Location: [place];  Service: Orthopedics;  Laterality: Right;  . BIOPSY  04/16/2018   Procedure: BIOPSY;  Surgeon: Irene Shipper, MD;  Location: [facility] ENDOSCOPY;  Service: Endoscopy;;  . CARDIAC CATHETERIZATION N/A 02/27/2015   Procedure: Left Heart Cath and Coronary Angiography;  Surgeon: [name], MD; LAD 40%, mCFX 80%, OM 70%, RCA 100% calcified       . CARDIAC CATHETERIZATION N/A 03/06/2015   Procedure: Coronary Stent Intervention;  Surgeon: [name], MD;  Location: [place] [facility];  Service: Cardiovascular;  Laterality: N/A;  Mid CX 3.50x12 promus DES w/ 0% resdual and Prox OM1 2.50x20 promus DES w/ 20% residual  . CARDIOVERSION    . CARDIOVERSION N/A 05/22/2018   Procedure: CARDIOVERSION;  Surgeon: Skeet Latch, MD;  Location: [place];  Service: Cardiovascular;  Laterality: N/A;  . CARPAL TUNNEL RELEASE Right Nov 2015  . CARPAL TUNNEL RELEASE Right 1992; 05/2014   [name]; [place]  . CESAREAN SECTION  1982; 1984  . ESOPHAGOGASTRODUODENOSCOPY (EGD) WITH PROPOFOL N/A 04/16/2018   Procedure: ESOPHAGOGASTRODUODENOSCOPY (EGD) WITH PROPOFOL;  Surgeon: Henrene Pastor,  Docia Chuck, MD;  Location: [facility] ENDOSCOPY;  Service: Endoscopy;  Laterality: N/A;  . FOOT NEUROMA SURGERY Bilateral 2000  . I&D EXTREMITY Left 01/06/2018   Procedure: DEBRIDEMENT ULCER LEFT FOOT;  Surgeon: [name], MD;  Location: [place];  Service: Orthopedics;  Laterality: Left;  . KNEE ARTHROSCOPY Right ~ 2003   "meniscus repair"  . LIPOMA EXCISION Right 2020   thigh  . SHOULDER OPEN ROTATOR CUFF REPAIR Right 1996; 1998   "w/fracture repair"  . THYROID SURGERY  2000   "removed lots of nodules"  . TONSILLECTOMY  1976    . apixaban  5 mg Oral BID  . atorvastatin  20 mg Oral Daily  .  budesonide (PULMICORT) nebulizer solution  0.25 mg Nebulization BID  . buPROPion  75 mg Oral BID  . famotidine  20 mg Oral Daily  . ferrous sulfate  325 mg Oral Daily  . FLUoxetine  20 mg Oral QHS  . insulin aspart  0-15 Units Subcutaneous TID WC  . insulin aspart  0-5 Units Subcutaneous QHS  . insulin glargine  15 Units Subcutaneous QHS  . loratadine  10 mg Oral QHS  . magnesium oxide  400 mg Oral QHS  . methimazole  10 mg Oral BID  . metoprolol succinate  75 mg Oral BID   . amiodarone 30 mg/hr (02/11/2019 1045)    Allergies  Allergen Reactions  . Contrast Media [Iodinated Diagnostic Agents] Hives and Other (See Comments)    Spoke to patient, Iodine allergy is really IV contrast allergy.   [name] [Insulin Aspart] Shortness Of Breath and Other (See Comments)    "breathing problems"  . Codeine Nausea And Vomiting and Other (See Comments)    HIGH DOSES-SEVERE VOMITING  . Iodine Other (See Comments)    MUST HAVE BENADRYL PRIOR TO PROCEDURE AND RIGHT BEFORE TREATMENT TO COUNTERACT REACTION-BLISTERING REACTION DERMATOLOGICAL  . Penicillins Itching, Rash and Other (See Comments)    Has patient had a PCN reaction causing immediate rash, facial/tongue/throat swelling, SOB or lightheadedness with hypotension: no Has patient had a PCN reaction causing severe rash involving mucus membranes or skin necrosis: No Has patient had a PCN reaction that required hospitalization No Has patient had a PCN reaction occurring within the last 10 years: No If all of the above answers are "NO", then may proceed with Cephalosporin use.  CHEST SIZED RASH AND ITCHING   . Ace Inhibitors Cough  . Demerol [Meperidine] Nausea And Vomiting  . Dilaudid [Hydromorphone Hcl] Other (See Comments)    HEADACHE   . Neosporin [Neomycin-Bacitracin Zn-Polymyx] Itching, Rash and Other (See Comments)    MAKES REACTIONS WORSE WHEN USING AS PROPHYLACTIC  . Percocet [Oxycodone-Acetaminophen] Rash  . Tape Itching and Rash     Social History   Socioeconomic History  . Marital status: Single    Spouse name: Not on file  . Number of children: 2  . Years of education: Masters  . Highest education level: Not on file  Occupational History  . Occupation: Landscape architect  Social Needs  . Financial resource strain: Somewhat hard  . Food insecurity    Worry: Sometimes true    Inability: Sometimes true  . Transportation needs    Medical: No    Non-medical: No  Tobacco Use  . Smoking status: Former Smoker    Packs/day: 0.00    Years: 41.00    Pack years: 0.00    Types: Cigarettes    Quit date: 03/05/2015  Years since quitting: 3.9  . Smokeless tobacco: Never Used  . Tobacco comment: 04/29/2015 "quit smoking cigarettes 02/27/2015"  Substance and Sexual Activity  . Alcohol use: No  . Drug use: No  . Sexual activity: Not Currently    Birth control/protection: Post-menopausal, Surgical  Lifestyle  . Physical activity    Days per week: 0 days    Minutes per session: 0 min  . Stress: Very much  Relationships  . Social connections    Talks on phone: More than three times a week    Gets together: More than three times a week    Attends religious service: More than 4 times per year    Active member of club or organization: No    Attends meetings of clubs or organizations: Never    Relationship status: Never married  . Intimate partner violence    Fear of current or ex partner: No    Emotionally abused: No    Physically abused: No    Forced sexual activity: No  Other Topics Concern  . 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      Lives with sister in [place].      Tobacco: 1 ppd x 41 years - quit 2016      Alcohol: none      Drugs: none      Exercise:  Walking in  neighborhood; physical job.   Right-handed.   2 cups caffeine daily.    Family History  Problem Relation Age of Onset  . Cancer Mother 4       bronchial cancer  . Breast cancer Mother   . Lung cancer Mother   . Hypertension Father   . COPD Father   . Heart disease Father 60       CAD with cardiac stenting  . Heart attack Father   . Parkinson's disease Father   . Allergies Sister   . Breast cancer Maternal Grandmother   . Emphysema Maternal Grandfather   . Leukemia Paternal Grandmother   . Emphysema Paternal Grandfather   . Thyroid disease Neg Hx   . Colon cancer Neg Hx   . Stomach cancer Neg Hx   . Rectal cancer Neg Hx   . Esophageal cancer Neg Hx     ROS- All systems are reviewed and negative except as per the HPI above  Physical Exam: Telemetry:  afib with RVR, atypical atrial flutter with aberrancy, Vitals:   02/09/2019 1400 02/23/2019 1415 02/10/2019 1430 02/23/2019 1435  BP: 97/84 104/83 108/80 109/89  Pulse: (!) 101  100 (!) 104  Resp:      Temp: 97.8 F (36.6 C)     TempSrc: Oral     SpO2: 98%  97% 99%  Weight:      Height:        GEN- The patient is ill appearing, alert and oriented x 3 today.   Head- normocephalic, atraumatic Eyes-  Sclera clear, conjunctiva pink Ears- hearing intact Oropharynx- clear Neck- supple, no JVP Lungs-  normal work of breathing Heart- tachycardic irregular rhythm GI- soft, NT, ND, + BS Extremities- s/p BKA Skin- diaphoretic Psych- euthymic mood, full affect Neuro- strength and sensation are intact  EKG-  reviewed  Labs:   Lab  Results  Component Value Date   WBC 10.1 02/23/2019   HGB 9.9 (L) 02/25/2019   HCT 31.8 (L) 02/22/2019   MCV 85.7 02/14/2019   PLT 353 02/27/2019    Recent Labs  Lab 02/19/2019 0331 03/06/2019 1024  NA 128*  --   K 4.7  --   CL 91*  --   CO2 22  --   BUN 33*  --   CREATININE 1.63*  --   CALCIUM 9.1  --   PROT  --  6.1*  BILITOT  --  1.3*  ALKPHOS  --  144*  ALT  --  234*  AST  --   315*  GLUCOSE 130*  --      Echo:  reviewed  ASSESSMENT AND PLAN:    1. afib with RVR/ atypical atrial flutter with abberancy The patient is quite ill.  I worry that she is approaching cardiogenic shock.  I would advise urgent cardioversion at this time.  Though she did miss several recent doses of eliquis, she will not tolerate her afib any longer.  I did discuss at length with the patient at length this am.  She understands risks of anesthesia and cardioversion.  She is aware of risks of stroke and wishes to proceed.  We will therefore proceed urgently with cardioversion at this time. Continue eliquis for chads2vasc score of at least 5 Continue IV amiodarone Reassess once in sinus  2. Acute CHF Will proceed with cardioversion as above EF 30% Given severe LA enlargement, she may fail to maintain sinus rhythm long term.  At that point, I would advise CRT-D with AV nodal ablation. Reassess CHF once in sinus General cardiology to manage  3. HTN Stable No change required today  4. CAD Continue medical management  5. Hyperthyroidism May ultimately limit our ability to use amiodarone  The patient is very ill.  Rapid response team is at bedside upon my visit for EP consultation. Critical care time was exclusive of separate billable procedures and treating other patients.  Critial care time was spent personally by me (independant of midlevel providers) on the following activities: development of treatment plan with patient as well as nursing, discussions with OR scheduling and Dr Sallyanne Kuster, evaluation of patients response to treatment, examining patient, obtaining history from patient or surrogate, ordering/ reviewing treatments/ interventions, lab studies, radiographic studies, pulse ox, and re-evaluation of patients condition.     The patient is critically ill with multiple organ systems failure and requires high complexity decision making for assessment and support, frequent evaluation  and titration of therapies, application of advanced monitoring technologies and extensive interpretation of databases.   Critical care was necessary to treat or prevent immintent or life-threatening deterioration.    Total CCT spent directly with the patient today is 45 min  Thompson Grayer MD, [facility] 02/15/2019 2:58 PM

## 2019-02-24 NOTE — Progress Notes (Signed)
The patient has had ongoing clinical decline.  She was emergently cardioverted due to rapidly conducting afib and cardiogenic shock.    She has required intubation and pressor support.  She will be transported to ICU for aggressive medical management.     PCCM has been consulted for assistance with vent management.  She will likely benefit from arterial line access and CVL.  We will obtain coox after CVL is in place.   Critical care time was exclusive of separate billable procedures and treating other patients.  Critial care time was spent personally by me (independant of midlevel providers) on the following activities: development of treatment plan with patient and/or surrogate as well as nursing, discussions with anesthesia, primary team, and PCCM, evaluation of patients response to treatment, examining patient, obtaining history from patient or surrogate, ordering/ reviewing treatments/ interventions, lab studies, radiographic studies, pulse ox, and re-evaluation of patients condition.     The patient is critically ill with multiple organ systems failure and requires high complexity decision making for assessment and support, frequent evaluation and titration of therapies, application of advanced monitoring technologies and extensive interpretation of databases.   Critical care was necessary to treat or prevent immintent or life-threatening deterioration.    An additional 45 minutes of CCT was spent with her today.  Thompson Grayer MD, [facility] 03/03/2019 4:17 PM

## 2019-02-24 NOTE — Progress Notes (Signed)
Late note: RT obtained ABG with the following results. No changes at this time. Pt very dyssynchronous with the vent, breath stacking and double triggering vent. RT notified CCM MD. RT will continue to monitor.   PH 7.36 PCO2  44.6 PaO2 112  HCO3  25.6

## 2019-02-24 NOTE — Progress Notes (Signed)
RT obtained ABG for post vent change assessment with the following results. No changes at this time. RT will continue to monitor.   Results for Judith [name], Judith [name] (MRN [id number]) as of 02/20/2019 22:35  Ref. Range 03/08/2019 22:28  Sample type Unknown ARTERIAL  pH, Arterial Latest Ref Range: 7.350 - 7.450  7.429  pCO2 arterial Latest Ref Range: 32.0 - 48.0 mmHg 40.1  pO2, Arterial Latest Ref Range: 83.0 - 108.0 mmHg 116.0 (H)  TCO2 Latest Ref Range: 22 - 32 mmol/L 28  Acid-Base Excess Latest Ref Range: 0.0 - 2.0 mmol/L 2.0  Bicarbonate Latest Ref Range: 20.0 - 28.0 mmol/L 26.6  O2 Saturation Latest Units: % 99.0  Patient temperature Unknown 97.8 F  Collection site Unknown ARTERIAL LINE

## 2019-02-24 NOTE — Anesthesia Postprocedure Evaluation (Signed)
Anesthesia Post Note  Patient: Judith [name]  Procedure(s) Performed: CARDIOVERSION (N/A )     Patient location during evaluation: SICU Anesthesia Type: General Level of consciousness: sedated Pain management: pain level controlled Vital Signs Assessment: post-procedure vital signs reviewed and stable Respiratory status: patient remains intubated per anesthesia plan Cardiovascular status: stable Postop Assessment: no apparent nausea or vomiting Anesthetic complications: no  Pt required intubation after cardioversion with resuscitation and transfer to the ICU.  Last Vitals:  Vitals:   02/23/2019 1435 02/16/2019 1615  BP: 109/89   Pulse: (!) 104   Resp:    Temp:    SpO2: 99% 100%    Last Pain:  Vitals:   02/18/2019 1400  TempSrc: Oral  PainSc:                  Donta Mcinroy DAVID

## 2019-02-24 NOTE — Anesthesia Preprocedure Evaluation (Signed)
Anesthesia Evaluation  Patient identified by MRN, date of birth, ID band Patient awake    Reviewed: Allergy & Precautions, NPO status , Patient's Chart, lab work & pertinent test results  Airway Mallampati: II  TM Distance: >3 FB Neck ROM: Full    Dental   Pulmonary asthma , sleep apnea , former smoker,    Pulmonary exam normal        Cardiovascular hypertension, Pt. on medications + CAD and + Cardiac Stents  Normal cardiovascular exam+ dysrhythmias Atrial Fibrillation      Neuro/Psych Anxiety Depression    GI/Hepatic GERD  Medicated and Controlled,  Endo/Other  diabetes, Type 2, Insulin Dependent  Renal/GU Renal InsufficiencyRenal disease     Musculoskeletal   Abdominal   Peds  Hematology   Anesthesia Other Findings   Reproductive/Obstetrics                             Anesthesia Physical Anesthesia Plan  ASA: III  Anesthesia Plan: General   Post-op Pain Management:    Induction:   PONV Risk Score and Plan: Treatment may vary due to age or medical condition  Airway Management Planned: Mask  Additional Equipment:   Intra-op Plan:   Post-operative Plan:   Informed Consent: I have reviewed the patients History and Physical, chart, labs and discussed the procedure including the risks, benefits and alternatives for the proposed anesthesia with the patient or authorized representative who has indicated his/her understanding and acceptance.       Plan Discussed with: CRNA and Surgeon  Anesthesia Plan Comments:         Anesthesia Quick Evaluation

## 2019-02-24 NOTE — Progress Notes (Signed)
Pt became unstable this afternoon with varying heart arrhythmias presenting on telemetry  including unsustained runs of v-tach, a-fib, and short episodes of both tachycardia and bradycardia.  Pt was stable prior to this.  Pt further presents with nausea without vomitus, increasing severity of SOB, worsining generalized weakness, and dizziness.  Pt is also very diaphoretic with cold bilateral lower extremities.  Rapid response called and have assessed pt.  EKG performed.  Allred MD and Pahwani MD notified and both have assessed pt.  Allred MD has scheduled pt for emergent cardioversion.  Currently awaiting OR placement.  Pt is being continually monitored.

## 2019-02-24 NOTE — Plan of Care (Signed)
Asked to evaluate patient in setting of vent dyssynchrony. Multiple settings had been tested including SIMV and PS. Patient continued to double trigger and it was reverse triggering at that. Increased RR and TV to increase MV. TV increased to 8cc/kg and RR 20. Most recent gas showed CO2 of 44 and PH of 7.36. So mildly acidotic. Think that changes may have patietn become slightly alkalotic which is acceptable. Of notes have also increased trigger sensitivity to -2. Will repeat Blood gas in 1 hour and see effect of changes.   Newell Coral DO Internal Medicine/Pediatrics Pulmonary and Critical Care Fellow PGY-6

## 2019-02-24 NOTE — Consult Note (Signed)
ELECTROPHYSIOLOGY CONSULT NOTE    Primary Care Physician: Rutherford Guys, MD Referring Physician:  Dr Sallyanne Kuster  Admit Date: 03/08/2019  Reason for consultation:  afib with RVR  Judith [name] is a 68 y.o. female with a h/o refractory atrial fibrillation.  She is now admitted with worsening CHF in the setting of her AF.  She has been placed on amiodarone but remains in afib.  She has developed rapidly conduction/ atypical atrial flutter with aberrancy today.  In the setting of persistent RVR, her EF has declined.  Today, she has become weak and lethargic.  + nausea and poor appetite.  EP is consulted for further evaluation.    Past Medical History:  Diagnosis Date  . Abnormal EKG 07/31/2013  . Arthritis    "hands" (03/06/2015)  . Asthma   . Carpal tunnel syndrome, bilateral   . Chronic kidney disease   . Complication of anesthesia    slow to wake up. Pt sts she woke up during surgery many years ago.  . Coronary artery disease     2 v CAD with CTO of the RCA and high grade bifurcational LCx/OM stenosis. S/P PCI DES x 2 to the LCx/OM.  Marland Kitchen Diabetic peripheral neuropathy ([place]) "since 1996"  . GERD (gastroesophageal reflux disease)   . Goiter   . Headache    migraines prior to menopause  . History of shingles 06/01/2013  . Hyperlipidemia LDL goal <70 10/13/2015  . Hypertension   . Hyperthyroidism   . Osteomyelitis of foot (HCC)    Right  . PAF (paroxysmal atrial fibrillation) ([place]) 04/29/2015   CHADS2VASC score of 5 now on Apixaban  . Pneumonia ~ 1976  . Sleep apnea    Bipap  . Tremors of nervous system   . Type II diabetes mellitus (HCC)    insulin dependent   Past Surgical History:  Procedure Laterality Date  . ABDOMINAL HYSTERECTOMY  1988   age 70; CERVICAL DYSPLASIA; ovaries intact.   . AMPUTATION Right 01/23/2016   Procedure: Right 3rd Ray Amputation;  Surgeon: [name], MD;  Location: [place];  Service: Orthopedics;  Laterality: Right;  . AMPUTATION Right 02/13/2016    Procedure: Right Transmetatarsal Amputation;  Surgeon: [name], MD;  Location: [place];  Service: Orthopedics;  Laterality: Right;  . AMPUTATION Right 04/15/2018   Procedure: AMPUTATION BELOW KNEE;  Surgeon: [name], MD;  Location: [place];  Service: Orthopedics;  Laterality: Right;  . BIOPSY  04/16/2018   Procedure: BIOPSY;  Surgeon: Irene Shipper, MD;  Location: [facility] ENDOSCOPY;  Service: Endoscopy;;  . CARDIAC CATHETERIZATION N/A 02/27/2015   Procedure: Left Heart Cath and Coronary Angiography;  Surgeon: [name], MD; LAD 40%, mCFX 80%, OM 70%, RCA 100% calcified       . CARDIAC CATHETERIZATION N/A 03/06/2015   Procedure: Coronary Stent Intervention;  Surgeon: [name], MD;  Location: [place] [facility];  Service: Cardiovascular;  Laterality: N/A;  Mid CX 3.50x12 promus DES w/ 0% resdual and Prox OM1 2.50x20 promus DES w/ 20% residual  . CARDIOVERSION    . CARDIOVERSION N/A 05/22/2018   Procedure: CARDIOVERSION;  Surgeon: Skeet Latch, MD;  Location: [place];  Service: Cardiovascular;  Laterality: N/A;  . CARPAL TUNNEL RELEASE Right Nov 2015  . CARPAL TUNNEL RELEASE Right 1992; 05/2014   [name]; [place]  . CESAREAN SECTION  1982; 1984  . ESOPHAGOGASTRODUODENOSCOPY (EGD) WITH PROPOFOL N/A 04/16/2018   Procedure: ESOPHAGOGASTRODUODENOSCOPY (EGD) WITH PROPOFOL;  Surgeon: Henrene Pastor,  Docia Chuck, MD;  Location: [facility] ENDOSCOPY;  Service: Endoscopy;  Laterality: N/A;  . FOOT NEUROMA SURGERY Bilateral 2000  . I&D EXTREMITY Left 01/06/2018   Procedure: DEBRIDEMENT ULCER LEFT FOOT;  Surgeon: [name], MD;  Location: [place];  Service: Orthopedics;  Laterality: Left;  . KNEE ARTHROSCOPY Right ~ 2003   "meniscus repair"  . LIPOMA EXCISION Right 2020   thigh  . SHOULDER OPEN ROTATOR CUFF REPAIR Right 1996; 1998   "w/fracture repair"  . THYROID SURGERY  2000   "removed lots of nodules"  . TONSILLECTOMY  1976    . apixaban  5 mg Oral BID  . atorvastatin  20 mg Oral Daily  .  budesonide (PULMICORT) nebulizer solution  0.25 mg Nebulization BID  . buPROPion  75 mg Oral BID  . famotidine  20 mg Oral Daily  . ferrous sulfate  325 mg Oral Daily  . FLUoxetine  20 mg Oral QHS  . insulin aspart  0-15 Units Subcutaneous TID WC  . insulin aspart  0-5 Units Subcutaneous QHS  . insulin glargine  15 Units Subcutaneous QHS  . loratadine  10 mg Oral QHS  . magnesium oxide  400 mg Oral QHS  . methimazole  10 mg Oral BID  . metoprolol succinate  75 mg Oral BID   . amiodarone 30 mg/hr (02/18/2019 1045)    Allergies  Allergen Reactions  . Contrast Media [Iodinated Diagnostic Agents] Hives and Other (See Comments)    Spoke to patient, Iodine allergy is really IV contrast allergy.   [name] [Insulin Aspart] Shortness Of Breath and Other (See Comments)    "breathing problems"  . Codeine Nausea And Vomiting and Other (See Comments)    HIGH DOSES-SEVERE VOMITING  . Iodine Other (See Comments)    MUST HAVE BENADRYL PRIOR TO PROCEDURE AND RIGHT BEFORE TREATMENT TO COUNTERACT REACTION-BLISTERING REACTION DERMATOLOGICAL  . Penicillins Itching, Rash and Other (See Comments)    Has patient had a PCN reaction causing immediate rash, facial/tongue/throat swelling, SOB or lightheadedness with hypotension: no Has patient had a PCN reaction causing severe rash involving mucus membranes or skin necrosis: No Has patient had a PCN reaction that required hospitalization No Has patient had a PCN reaction occurring within the last 10 years: No If all of the above answers are "NO", then may proceed with Cephalosporin use.  CHEST SIZED RASH AND ITCHING   . Ace Inhibitors Cough  . Demerol [Meperidine] Nausea And Vomiting  . Dilaudid [Hydromorphone Hcl] Other (See Comments)    HEADACHE   . Neosporin [Neomycin-Bacitracin Zn-Polymyx] Itching, Rash and Other (See Comments)    MAKES REACTIONS WORSE WHEN USING AS PROPHYLACTIC  . Percocet [Oxycodone-Acetaminophen] Rash  . Tape Itching and Rash     Social History   Socioeconomic History  . Marital status: Single    Spouse name: Not on file  . Number of children: 2  . Years of education: Masters  . Highest education level: Not on file  Occupational History  . Occupation: Landscape architect  Social Needs  . Financial resource strain: Somewhat hard  . Food insecurity    Worry: Sometimes true    Inability: Sometimes true  . Transportation needs    Medical: No    Non-medical: No  Tobacco Use  . Smoking status: Former Smoker    Packs/day: 0.00    Years: 41.00    Pack years: 0.00    Types: Cigarettes    Quit date: 03/05/2015  Years since quitting: 3.9  . Smokeless tobacco: Never Used  . Tobacco comment: 04/29/2015 "quit smoking cigarettes 02/27/2015"  Substance and Sexual Activity  . Alcohol use: No  . Drug use: No  . Sexual activity: Not Currently    Birth control/protection: Post-menopausal, Surgical  Lifestyle  . Physical activity    Days per week: 0 days    Minutes per session: 0 min  . Stress: Very much  Relationships  . Social connections    Talks on phone: More than three times a week    Gets together: More than three times a week    Attends religious service: More than 4 times per year    Active member of club or organization: No    Attends meetings of clubs or organizations: Never    Relationship status: Never married  . Intimate partner violence    Fear of current or ex partner: No    Emotionally abused: No    Physically abused: No    Forced sexual activity: No  Other Topics Concern  . 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      Lives with sister in [place].      Tobacco: 1 ppd x 41 years - quit 2016      Alcohol: none      Drugs: none      Exercise:  Walking in  neighborhood; physical job.   Right-handed.   2 cups caffeine daily.    Family History  Problem Relation Age of Onset  . Cancer Mother 49       bronchial cancer  . Breast cancer Mother   . Lung cancer Mother   . Hypertension Father   . COPD Father   . Heart disease Father 41       CAD with cardiac stenting  . Heart attack Father   . Parkinson's disease Father   . Allergies Sister   . Breast cancer Maternal Grandmother   . Emphysema Maternal Grandfather   . Leukemia Paternal Grandmother   . Emphysema Paternal Grandfather   . Thyroid disease Neg Hx   . Colon cancer Neg Hx   . Stomach cancer Neg Hx   . Rectal cancer Neg Hx   . Esophageal cancer Neg Hx     ROS- All systems are reviewed and negative except as per the HPI above  Physical Exam: Telemetry:  afib with RVR, atypical atrial flutter with aberrancy, Vitals:   03/10/2019 1400 02/09/2019 1415 02/10/2019 1430 02/23/2019 1435  BP: 97/84 104/83 108/80 109/89  Pulse: (!) 101  100 (!) 104  Resp:      Temp: 97.8 F (36.6 C)     TempSrc: Oral     SpO2: 98%  97% 99%  Weight:      Height:        GEN- The patient is ill appearing, alert and oriented x 3 today.   Head- normocephalic, atraumatic Eyes-  Sclera clear, conjunctiva pink Ears- hearing intact Oropharynx- clear Neck- supple, no JVP Lungs-  normal work of breathing Heart- tachycardic irregular rhythm GI- soft, NT, ND, + BS Extremities- s/p BKA Skin- diaphoretic Psych- euthymic mood, full affect Neuro- strength and sensation are intact  EKG-  reviewed  Labs:   Lab  Results  Component Value Date   WBC 10.1 02/23/2019   HGB 9.9 (L) 02/18/2019   HCT 31.8 (L) 02/12/2019   MCV 85.7 02/15/2019   PLT 353 02/23/2019    Recent Labs  Lab 02/12/2019 0331 02/21/2019 1024  NA 128*  --   K 4.7  --   CL 91*  --   CO2 22  --   BUN 33*  --   CREATININE 1.63*  --   CALCIUM 9.1  --   PROT  --  6.1*  BILITOT  --  1.3*  ALKPHOS  --  144*  ALT  --  234*  AST  --   315*  GLUCOSE 130*  --      Echo:  reviewed  ASSESSMENT AND PLAN:    1. afib with RVR/ atypical atrial flutter with abberancy The patient is quite ill.  I worry that she is approaching cardiogenic shock.  I would advise urgent cardioversion at this time.  Though she did miss several recent doses of eliquis, she will not tolerate her afib any longer.  I did discuss at length with the patient at length this am.  She understands risks of anesthesia and cardioversion.  She is aware of risks of stroke and wishes to proceed.  We will therefore proceed urgently with cardioversion at this time. Continue eliquis for chads2vasc score of at least 5 Continue IV amiodarone Reassess once in sinus  2. Acute CHF Will proceed with cardioversion as above EF 30% Given severe LA enlargement, she may fail to maintain sinus rhythm long term.  At that point, I would advise CRT-D with AV nodal ablation. Reassess CHF once in sinus General cardiology to manage  3. HTN Stable No change required today  4. CAD Continue medical management  5. Hyperthyroidism May ultimately limit our ability to use amiodarone  The patient is very ill.  Rapid response team is at bedside upon my visit for EP consultation. Critical care time was exclusive of separate billable procedures and treating other patients.  Critial care time was spent personally by me (independant of midlevel providers) on the following activities: development of treatment plan with patient as well as nursing, discussions with OR scheduling and Dr Sallyanne Kuster, evaluation of patients response to treatment, examining patient, obtaining history from patient or surrogate, ordering/ reviewing treatments/ interventions, lab studies, radiographic studies, pulse ox, and re-evaluation of patients condition.     The patient is critically ill with multiple organ systems failure and requires high complexity decision making for assessment and support, frequent evaluation  and titration of therapies, application of advanced monitoring technologies and extensive interpretation of databases.   Critical care was necessary to treat or prevent immintent or life-threatening deterioration.    Total CCT spent directly with the patient today is 45 min  Thompson Grayer MD, [facility] 02/19/2019 2:58 PM

## 2019-02-24 NOTE — Treatment Plan (Addendum)
Treatment Plan Update:  The patient was evaluated this evening. She remains intubated on stable ventilatory settings. Current vasoactive medications include dopamine 5 mcg/kg/min. Phenylephrine has been ordered but is not currently running. Evening labs include lactate 5.7, K 5.1, Cr 2.29 (baseline 1.2-1.3).  INR 6.5.   Central line was placed and CVP of 25 was measured (with PEEP of 8); measurement was repeated when I was in the room was 19. She remains in sinus rhythm.   Assessment and Recommendations: The patient's elevated CVP is consistent volume overload, and her lab trends are concerning for congestion and poor systemic perfusion from cardiogenic shock. She will need diuresis and will likely need titration of her inotropic support. Hopefully, restoration of sinus rhythm will also allow for improvement of her systolic function and hemodynamic status.  -Repeat lactate ordered at regular intervals. -Will start trending Cooximetry panel -IV furosemide x1 ordered. Anticipate need for repeat dosing with titration based on response. -Continue dopamine at current dose. She may need additional inotropic support (ie dobutamine) based on clinical trajectory. -Advised nurse to hold phenylephrine at this time to minimize increased afterload in the setting of cardiogenic shock. -Recommend discussion of anticoagulation plan with pharmacy and/or hematology given progressive renal failure and significant INR elevation. Given recent DCCV, she will benefit from maintenance of anticoagulated state, although caution is needed in the setting of her multi-organ failure.

## 2019-02-24 NOTE — Progress Notes (Signed)
Spoke with daughter, who was in the room. Updated on condition, questions answered.   Bonna Gains, MD PhD 02/16/2019 7:57 PM

## 2019-02-24 NOTE — Significant Event (Addendum)
Rapid Response Event Note  Overview: "Second Set of Eyes" - Cardiac  Initial Focused Assessment: Nurse called with concerns of patient's telemetry reading "VT", "VF", AF and being irregular. I asked that the staff obtain an EKG and change the telemetry leads out and that I was coming up to see the patient. Upon arrival, patient was lethargic, she would wake to voice but appeared quite fatigued, she stated she had some chest discomfort, she was dizzy, + SOB, ongoing nausea and weakness. Her hands and feet were very cool to touch and mottled, overall her color was gray and she was profusely diaphoretic. Blood sugar was normal. Lung sounds - clear in the upper fields, diminished in the lower fields. SBP 90-110s, MAPs 70s, 99% on 2L. Appears to be in cardiogenic shock. RN paged TRH MD and CARDS MD. Both providers came to the bedside. + AMIO infusion  Interventions: -- EKG - wide complex AF   Plan of Care: -- CARDS MD made the decision that patient needed urgent cardioversion. MD coordinated with Anesthesia and PACU. Patient was taken to PACU by nursing staff, I left to see another patient. I called 2H, spoke with CRN and updated her on the situation, in case the patient needed to transferred to CVICU post procedure.  -- I went down to PACU later, patient had been successfully cardioverted however needed to be intubated and was now hypotensive. Anesthesia Team and CARDS MD managing care. I assisted and then helped transport the patient to 2H25  Event Summary:  Call Time 1408  End Time 1455 I arrived in PACU 1550 Transfer to ICU [place], [place]

## 2019-02-24 NOTE — Consult Note (Signed)
NAME:  Judith [name], MRN:  [id number], DOB:  1950/07/07, LOS: 1 ADMISSION DATE:  03/03/2019, CONSULTATION DATE:  02/12/2019 REFERRING MD:  Dr. Rayann Heman, CHIEF COMPLAINT:  Resp. Fail.    Brief History   68 y/o with multiple comorbidities, including paroxysmal afib. Admitted in afib w/ RVR with what appears to be developing CHF. Admitted j10/16, became more SOB today, still in AFib despite amiodarone and dilt. Taken for cardioversion but became more hypotensive, now requiring pressor support, intubated. Called for vent management; assistance with pressors  History of present illness   Judith [name] is a 68 y.o. female with medical history significant of paroxysmal atrial fibrillation status post several sessions of cardioversion, V. tach in the past, asthma, CKD stage III, hyperlipidemia, hypertension, hypothyroidism and several other comorbidities presented to ED with a complaint of chest pain and shortness of breath.  Patient is a poor historian and she is very anxious currently.  She tells me that she started having shortness of breath several weeks ago and started having chest pain about 2 to 4 days ago.  According to her, shortness of breath is exertional and chest pain is intermittent which is dull and pressure-like, 8 out of 10 at its peak with no aggravating or relieving factor.  Nonradiating.  It is associated with intermittent nausea and shortness of breath but no dizziness or diaphoresis.  Apparently, she was supposed to have cardioversion on 02/19/2019 but that was canceled since patient had missed her Eliquis dosage for 3 consecutive days prior to that.  This was rescheduled for 3 weeks.  Patient ended up in the emergency department.  Of note, patient was recently started on prednisone about 3 days ago for " COPD" however I do not see COPD as 1 of her past medical history.  Past Medical History  1) paroxysmal afib 2) CHF 3) COPD 4) Hyperthyroidism 5) IDDM 6) CRF   Significant Hospital  Events   10/17 cardioversion 10/17 intubation   Consults:  Cardiology PCCM  Procedures:  10/17--aline 10/17 RIJ central line 10/17 cardioversion  Significant Diagnostic Tests:  CXR pending  Micro Data:  COVID neg HIV neg  Antimicrobials:  None    Interim history/subjective:  Cardioversion with subsequent hypotension, resp failure  Objective   Blood pressure 109/89, pulse (!) 104, temperature 97.8 F (36.6 C), temperature source Oral, resp. rate 20, height 5\' 4"  (1.626 m), weight 81.2 kg, SpO2 100 %.    Vent Mode: PRVC FiO2 (%):  [21 %-100 %] 100 % Set Rate:  [49 bmp] 49 bmp Vt Set:  [430 mL] 430 mL PEEP:  [5 cmH20] 5 cmH20 Plateau Pressure:  [23 cmH20] 23 cmH20   Intake/Output Summary (Last 24 hours) at 03/08/2019 1628 Last data filed at 02/26/2019 1300 Gross per 24 hour  Intake 371.85 ml  Output 550 ml  Net -178.15 ml   Filed Weights   02/15/2019 1445 02/21/2019 1858 02/20/2019 0500  Weight: 86.2 kg 86.1 kg 81.2 kg    Examination: General: ill appering HENT: ETT, [place]/AT Lungs: diffuse crackles Cardiovascular: RRR Abdomen: SND  Extremities: R BKA, no edema, thready pulses  Neuro: will awaken, PERRLA, EOMI,  GU: --  Resolved Hospital Problem list   None   Assessment & Plan:  Multiple comorbidities including Afib, cardioverted with subsequent hypotension likely cardiogenic shock, respiratory status questionable in the setting of exam, acidosis.   Hypotension, likely cardiogenic 1) continue pressors. If tachycardic, would consider transition from dopamine to Levophed Arterial line for frequent lab  draws, including ABGs  Acidosis: Bicarb bolus with bicarb drip, hyperventilation,   F/E/N: NPO for now, correct electrolytes, monitor fluid status and diurese if necessary  Acute respiratory distress: Patient intubated, sedated.  Patient appeared to be coherent enough for extubation earlier, but given her acidosis we will continue mechanical ventilation,  manage her acidosis, wean and extubate as able.  Atrial fibrillation with RVR. Continue amiodarone drip  IDDM: POC glucose monitoring, SSI   Best practice:  Diet: NPO Pain/Anxiety/Delirium protocol (if indicated): per protocol  VAP protocol (if indicated): HOB >30 DVT prophylaxis: anticoag per cardiology  GI prophylaxis: protocol Glucose control: SSI  Mobility: bed Code Status: full Family Communication: not avail  Disposition:   Labs   CBC: Recent Labs  Lab 03/08/2019 1459 03/06/2019 0331  WBC 10.8* 10.1  NEUTROABS 8.1*  --   HGB 10.1* 9.9*  HCT 32.3* 31.8*  MCV 86.1 85.7  PLT 356 0000000    Basic Metabolic Panel: Recent Labs  Lab 02/27/2019 1459 02/08/2019 1738 02/19/2019 0331  NA 129*  --  128*  K 4.2  --  4.7  CL 89*  --  91*  CO2 26  --  22  GLUCOSE 85  --  130*  BUN 31*  --  33*  CREATININE 1.37*  --  1.63*  CALCIUM 9.2  --  9.1  MG  --  1.9  --    GFR: Estimated Creatinine Clearance: 35 mL/min (A) (by C-G formula based on SCr of 1.63 mg/dL (H)). Recent Labs  Lab 02/22/2019 1459 02/19/2019 0331  WBC 10.8* 10.1    Liver Function Tests: Recent Labs  Lab 02/10/2019 1459 02/26/2019 1024  AST 93* 315*  ALT 149* 234*  ALKPHOS 153* 144*  BILITOT 1.0 1.3*  PROT 6.6 6.1*  ALBUMIN 3.3* 3.0*   No results for input(s): LIPASE, AMYLASE in the last 168 hours. No results for input(s): AMMONIA in the last 168 hours.  ABG    Component Value Date/Time   PHART 7.431 01/04/2018 0740   PCO2ART 39.7 01/04/2018 0740   PO2ART 122 (H) 01/04/2018 0740   HCO3 21.5 05/14/2018 1242   TCO2 22 05/14/2018 1242   ACIDBASEDEF 2.0 05/14/2018 1242   O2SAT 72.0 05/14/2018 1242     Coagulation Profile: Recent Labs  Lab 03/03/2019 1644 02/16/2019 0331  INR 2.0* 3.2*    Cardiac Enzymes: No results for input(s): CKTOTAL, CKMB, CKMBINDEX, TROPONINI in the last 168 hours.  HbA1C: Hemoglobin A1C  Date/Time Value Ref Range Status  02/13/2019 01:10 PM 7.3 (A) 4.0 - 5.6 % Final    Hgb A1c MFr Bld  Date/Time Value Ref Range Status  03/06/2019 07:54 PM 8.1 (H) 4.8 - 5.6 % Final    Comment:    (NOTE) Pre diabetes:          5.7%-6.4% Diabetes:              >6.4% Glycemic control for   <7.0% adults with diabetes   10/18/2018 11:35 AM 6.6 (H) 4.8 - 5.6 % Final    Comment:             Prediabetes: 5.7 - 6.4          Diabetes: >6.4          Glycemic control for adults with diabetes: <7.0     CBG: Recent Labs  Lab 02/22/2019 2145 02/21/2019 0615 02/09/2019 1151 02/27/2019 1420 02/25/2019 1522  GLUCAP 199* 206* 111* 99 84    Review of Systems:  Unable to obtain b/c of intubation, sedation  Past Medical History  She,  has a past medical history of Abnormal EKG (07/31/2013), Arthritis, Asthma, Carpal tunnel syndrome, bilateral, Chronic kidney disease, Complication of anesthesia, Coronary artery disease, Diabetic peripheral neuropathy ([place]) ("since 1996"), GERD (gastroesophageal reflux disease), Goiter, Headache, History of shingles (06/01/2013), Hyperlipidemia LDL goal <70 (10/13/2015), Hypertension, Hyperthyroidism, Osteomyelitis of foot ([place]), PAF (paroxysmal atrial fibrillation) ([place]) (04/29/2015), Pneumonia (~ 1976), Sleep apnea, Tremors of nervous system, and Type II diabetes mellitus ([place]).   Surgical History    Past Surgical History:  Procedure Laterality Date  . ABDOMINAL HYSTERECTOMY  1988   age 41; CERVICAL DYSPLASIA; ovaries intact.   . AMPUTATION Right 01/23/2016   Procedure: Right 3rd Ray Amputation;  Surgeon: [name], MD;  Location: [facility];  Service: Orthopedics;  Laterality: Right;  . AMPUTATION Right 02/13/2016   Procedure: Right Transmetatarsal Amputation;  Surgeon: [name], MD;  Location: [facility];  Service: Orthopedics;  Laterality: Right;  . AMPUTATION Right 04/15/2018   Procedure: AMPUTATION BELOW KNEE;  Surgeon: [name], MD;  Location: [facility];  Service: Orthopedics;  Laterality: Right;  . BIOPSY  04/16/2018   Procedure: BIOPSY;  Surgeon:  Irene Shipper, MD;  Location: [facility] ENDOSCOPY;  Service: Endoscopy;;  . CARDIAC CATHETERIZATION N/A 02/27/2015   Procedure: Left Heart Cath and Coronary Angiography;  Surgeon: [name], MD; LAD 40%, mCFX 80%, OM 70%, RCA 100% calcified       . CARDIAC CATHETERIZATION N/A 03/06/2015   Procedure: Coronary Stent Intervention;  Surgeon: [name], MD;  Location: [facility];  Service: Cardiovascular;  Laterality: N/A;  Mid CX 3.50x12 promus DES w/ 0% resdual and Prox OM1 2.50x20 promus DES w/ 20% residual  . CARDIOVERSION    . CARDIOVERSION N/A 05/22/2018   Procedure: CARDIOVERSION;  Surgeon: Skeet Latch, MD;  Location: [facility];  Service: Cardiovascular;  Laterality: N/A;  . CARPAL TUNNEL RELEASE Right Nov 2015  . CARPAL TUNNEL RELEASE Right 1992; 05/2014   [name]; [place]  . CESAREAN SECTION  1982; 1984  . ESOPHAGOGASTRODUODENOSCOPY (EGD) WITH PROPOFOL N/A 04/16/2018   Procedure: ESOPHAGOGASTRODUODENOSCOPY (EGD) WITH PROPOFOL;  Surgeon: Irene Shipper, MD;  Location: [facility] ENDOSCOPY;  Service: Endoscopy;  Laterality: N/A;  . FOOT NEUROMA SURGERY Bilateral 2000  . I&D EXTREMITY Left 01/06/2018   Procedure: DEBRIDEMENT ULCER LEFT FOOT;  Surgeon: [name], MD;  Location: [facility] [facility];  Service: Orthopedics;  Laterality: Left;  . KNEE ARTHROSCOPY Right ~ 2003   "meniscus repair"  . LIPOMA EXCISION Right 2020   thigh  . SHOULDER OPEN ROTATOR CUFF REPAIR Right 1996; 1998   "w/fracture repair"  . THYROID SURGERY  2000   "removed lots of nodules"  . TONSILLECTOMY  1976     Social History   reports that she quit smoking about 3 years ago. Her smoking use included cigarettes. She smoked 0.00 packs per day for 41.00 years. She has never used smokeless tobacco. She reports that she does not drink alcohol or use drugs.   Family History   Her family history includes Allergies in her sister; Breast cancer in her maternal grandmother and mother; COPD in her father; Cancer (age of  onset: 53) in her mother; Emphysema in her maternal grandfather and paternal grandfather; Heart attack in her father; Heart disease (age of onset: 45) in her father; Hypertension in her father; Leukemia in her paternal grandmother; Lung cancer in her mother; Parkinson's disease in her father.  There is no history of Thyroid disease, Colon cancer, Stomach cancer, Rectal cancer, or Esophageal cancer.   Allergies Allergies  Allergen Reactions  . Contrast Media [Iodinated Diagnostic Agents] Hives and Other (See Comments)    Spoke to patient, Iodine allergy is really IV contrast allergy.   [name] [Insulin Aspart] Shortness Of Breath and Other (See Comments)    "breathing problems"  . Codeine Nausea And Vomiting and Other (See Comments)    HIGH DOSES-SEVERE VOMITING  . Iodine Other (See Comments)    MUST HAVE BENADRYL PRIOR TO PROCEDURE AND RIGHT BEFORE TREATMENT TO COUNTERACT REACTION-BLISTERING REACTION DERMATOLOGICAL  . Penicillins Itching, Rash and Other (See Comments)    Has patient had a PCN reaction causing immediate rash, facial/tongue/throat swelling, SOB or lightheadedness with hypotension: no Has patient had a PCN reaction causing severe rash involving mucus membranes or skin necrosis: No Has patient had a PCN reaction that required hospitalization No Has patient had a PCN reaction occurring within the last 10 years: No If all of the above answers are "NO", then may proceed with Cephalosporin use.  CHEST SIZED RASH AND ITCHING   . Ace Inhibitors Cough  . Demerol [Meperidine] Nausea And Vomiting  . Dilaudid [Hydromorphone Hcl] Other (See Comments)    HEADACHE   . Neosporin [Neomycin-Bacitracin Zn-Polymyx] Itching, Rash and Other (See Comments)    MAKES REACTIONS WORSE WHEN USING AS PROPHYLACTIC  . Percocet [Oxycodone-Acetaminophen] Rash  . Tape Itching and Rash     Home Medications  Prior to Admission medications   Medication Sig Start Date End Date Taking? Authorizing  Provider  albuterol (PROVENTIL HFA) 108 (90 Base) MCG/ACT inhaler Inhale 1-2 puffs into the lungs every 6 (six) hours as needed for wheezing or shortness of breath. 05/01/18  Yes Angiulli, Lavon Paganini, PA-C  amiodarone (PACERONE) 200 MG tablet Take 1 tablet (200 mg total) by mouth daily. 06/08/18  Yes Sherran Needs, NP  atorvastatin (LIPITOR) 20 MG tablet Take 1 tablet (20 mg total) by mouth daily. 05/01/18  Yes Angiulli, Lavon Paganini, PA-C  buPROPion (WELLBUTRIN) 75 MG tablet Take 75 mg by mouth 2 (two) times daily.   Yes [provider]  cetirizine (ZYRTEC) 10 MG tablet Take 10 mg by mouth daily as needed for allergies.    Yes [provider]  diltiazem (CARTIA XT) 120 MG 24 hr capsule Take 1 capsule (120 mg total) by mouth daily. 02/12/19  Yes Bhagat, Bhavinkumar, PA  ELIQUIS 5 MG TABS tablet Take 1 tablet by mouth twice daily Patient taking differently: Take 5 mg by mouth 2 (two) times daily.  11/21/18  Yes Turner, Eber Hong, MD  famotidine (PEPCID) 20 MG tablet Take 1 tablet (20 mg total) by mouth 2 (two) times daily. Patient taking differently: Take 40 mg by mouth daily.  06/17/18  Yes Rutherford Guys, MD  ferrous sulfate 325 (65 FE) MG tablet Take 325 mg by mouth daily as needed (Low iron).   Yes [provider]  FLUoxetine (PROZAC) 20 MG capsule TAKE 1 CAPSULE AT BEDTIME Patient taking differently: Take 20 mg by mouth at bedtime.  01/03/19  Yes Rutherford Guys, MD  fluticasone (FLOVENT HFA) 44 MCG/ACT inhaler Inhale 2 puffs into the lungs 2 (two) times daily. Patient taking differently: Inhale 2 puffs into the lungs daily as needed (Allergies).  01/22/19  Yes Rutherford Guys, MD  furosemide (LASIX) 40 MG tablet Take 1 tablet (40 mg total) by mouth daily. 01/18/19  Yes Pamella Pert, Benay Spice  M, MD  hydrALAZINE (APRESOLINE) 50 MG tablet Take 1 tablet (50 mg total) by mouth 3 (three) times daily. 10/13/18  Yes Isaiah Serge, NP  insulin degludec (TRESIBA FLEXTOUCH) 100 UNIT/ML SOPN  FlexTouch Pen Inject 20 Units into the skin at bedtime.    Yes [provider]  ketoconazole (NIZORAL) 2 % cream Apply 1 application topically daily. 02/21/19  Yes Maximiano Coss, NP  loratadine (CLARITIN) 10 MG tablet Take 10 mg by mouth at bedtime.   Yes [provider]  magnesium oxide (MAG-OX) 400 MG tablet Take 400 mg by mouth at bedtime.    Yes [provider]  Melatonin 10 MG TABS Take 10 mg by mouth at bedtime as needed (Sleep).    Yes [provider]  metFORMIN (GLUCOPHAGE-XR) 500 MG 24 hr tablet Take 1,000 mg by mouth daily with breakfast.   Yes [provider]  methimazole (TAPAZOLE) 10 MG tablet TAKE 1 TABLET (10 MG TOTAL) BY MOUTH DAILY. Patient taking differently: Take 10 mg by mouth daily.  03/05/2019  Yes Renato Shin, MD  metoprolol succinate (TOPROL-XL) 25 MG 24 hr tablet Take 50 mg by mouth 2 (two) times daily.    Yes [provider]  mupirocin ointment (BACTROBAN) 2 % Apply 1 application topically 2 (two) times daily as needed (wound/sores).    Yes [provider]  nitroGLYCERIN (NITROSTAT) 0.4 MG SL tablet Place 0.4 mg under the tongue every 5 (five) minutes x 3 doses as needed for chest pain.    Yes [provider]  polyethylene glycol powder (GLYCOLAX/MIRALAX) 17 GM/SCOOP powder Take 17 g by mouth daily as needed (constipation.).    Yes [provider]  potassium chloride (K-DUR) 10 MEQ tablet Take 1 tablet (10 mEq total) by mouth 2 (two) times daily. Patient taking differently: Take 10 mEq by mouth every other day.  01/18/19  Yes Rutherford Guys, MD  triamcinolone (KENALOG) 0.1 % paste Use as directed 1 application in the mouth or throat 2 (two) times daily as needed (ulcer lesion.). 02/05/19  Yes Rutherford Guys, MD  TRUE METRIX BLOOD GLUCOSE TEST test strip TEST BLOOD SUGAR TWICE DAILY 09/16/18  Yes Renato Shin, MD     Critical care time:  I have independently seen and examined the patient,  reviewed data, and developed an assessment and plan  A total of 58 minutes were spent in critical care assessment and medical decision making. This critical care time does not reflect procedure time, or teaching time or supervisory time of PA/NP/Med student/Med Resident, etc but could involve care discussion time.   Bonna Gains, MD PhD 02/08/2019 7:28 PM

## 2019-02-24 NOTE — Transfer of Care (Addendum)
Immediate Anesthesia Transfer of Care Note  Patient: Judith [name]  Procedure(s) Performed: CARDIOVERSION (N/A )  Patient Location: ICU  Anesthesia Type:MAC  Level of Consciousness: drowsy  Airway & Oxygen Therapy: Patient Spontanous Breathing, Patient remains intubated per anesthesia plan and Patient placed on Ventilator (see vital sign flow sheet for setting)  Post-op Assessment: Report given to RN, Post -op Vital signs reviewed and stable and Patient moving all extremities  Post vital signs: Reviewed and stable  Last Vitals:  Vitals Value Taken Time  BP 95/65 02/15/2019 1654  Temp    Pulse 65 02/20/2019 1656  Resp 13 03/03/2019 1656  SpO2 97 % 02/08/2019 1656  Vitals shown include unvalidated device data.  Last Pain:  Vitals:   02/28/2019 1400  TempSrc: Oral  PainSc:          Complications: No apparent anesthesia complications.  Continued hypotension, to ICU for management.

## 2019-02-24 NOTE — Progress Notes (Signed)
CRITICAL VALUE ALERT  Critical Value:  Lactic acid 5.7 ; INR 6.5  Date & Time Notied:  1810  Provider Notified: Clydell Hakim   Orders Received/Actions taken: 2 amps of bicarb given and bicarb drip ordered per MD ; no new orders for INR value at this time.

## 2019-02-24 NOTE — Progress Notes (Signed)
Progress Note  Patient Name: Judith [name] Date of Encounter: 02/17/2019  Primary Cardiologist: Fransico Him, MD   Subjective   She is very nauseous and has been vomiting this morning.  She is not particularly dyspneic, but her breathing is "not right".  Good oxygen saturation on 2 L nasal cannula. A. fib rate control is greatly improved with ventricular rates mostly around 90-100. No longer has angina.  Inpatient Medications    Scheduled Meds:  apixaban  5 mg Oral BID   atorvastatin  20 mg Oral Daily   budesonide (PULMICORT) nebulizer solution  0.25 mg Nebulization BID   buPROPion  75 mg Oral BID   famotidine  20 mg Oral Daily   ferrous sulfate  325 mg Oral Daily   FLUoxetine  20 mg Oral QHS   hydrALAZINE  50 mg Oral TID   insulin aspart  0-15 Units Subcutaneous TID WC   insulin aspart  0-5 Units Subcutaneous QHS   insulin glargine  15 Units Subcutaneous QHS   loratadine  10 mg Oral QHS   magnesium oxide  400 mg Oral QHS   methimazole  10 mg Oral BID   metoprolol succinate  50 mg Oral BID   Continuous Infusions:  amiodarone 30 mg/hr (03/09/2019 1045)   diltiazem (CARDIZEM) infusion Stopped (03/08/2019 2255)   PRN Meds: acetaminophen, albuterol, Melatonin, nitroGLYCERIN, ondansetron (ZOFRAN) IV, polyethylene glycol, triamcinolone   Vital Signs    Vitals:   02/26/2019 0400 03/02/2019 0500 03/06/2019 0848 02/27/2019 1057  BP: 99/70   95/73  Pulse:      Resp: 20     Temp: (!) 97.4 F (36.3 C)   97.7 F (36.5 C)  TempSrc: Oral   Oral  SpO2: 99%  93% 97%  Weight:  81.2 kg    Height:        Intake/Output Summary (Last 24 hours) at 02/19/2019 1118 Last data filed at 02/18/2019 0930 Gross per 24 hour  Intake 371.85 ml  Output 550 ml  Net -178.15 ml   Last 3 Weights 02/11/2019 03/02/2019 02/20/2019  Weight (lbs) 179 lb 0.2 oz 189 lb 13.1 oz 190 lb  Weight (kg) 81.2 kg 86.1 kg 86.183 kg      Telemetry    Atrial fibrillation with mostly controlled  ventricular rate- Personally Reviewed  ECG    ECG from 02/11/2019 probably represents lesion, less likely atrial flutter with 2: 1 AV block, right superior axis deviation, poor R wave progression, consistent with pulmonary disease pattern- Personally Reviewed  Physical Exam  Appears chronically and acutely ill GEN: No acute distress.   Neck: No JVD Cardiac:  Irregular, no murmurs, rubs, or gallops.  Respiratory:  Breath sounds are diminished throughout, but there are no wheezes or rales GI: Soft, nontender, non-distended  MS: No edema; left BKA. Neuro:  Nonfocal  Psych: Normal affect   Labs    High Sensitivity Troponin:   Recent Labs  Lab 02/13/2019 1459 02/21/2019 1644 03/01/2019 1738 03/06/2019 1954  TROPONINIHS 375* 197* 476* 548*      Chemistry Recent Labs  Lab 02/09/2019 1459 02/23/2019 0331 02/08/2019 1024  NA 129* 128*  --   K 4.2 4.7  --   CL 89* 91*  --   CO2 26 22  --   GLUCOSE 85 130*  --   BUN 31* 33*  --   CREATININE 1.37* 1.63*  --   CALCIUM 9.2 9.1  --   PROT 6.6  --  6.1*  ALBUMIN  3.3*  --  3.0*  AST 93*  --  315*  ALT 149*  --  234*  ALKPHOS 153*  --  144*  BILITOT 1.0  --  1.3*  GFRNONAA 40* 32*  --   GFRAA 46* 38*  --   ANIONGAP 14 15  --      Hematology Recent Labs  Lab 02/15/2019 1459 02/19/2019 0331  WBC 10.8* 10.1  RBC 3.75* 3.71*  HGB 10.1* 9.9*  HCT 32.3* 31.8*  MCV 86.1 85.7  MCH 26.9 26.7  MCHC 31.3 31.1  RDW 15.8* 15.9*  PLT 356 353    BNP Recent Labs  Lab 02/17/2019 1459  BNP 1,119.0*     DDimer No results for input(s): DDIMER in the last 168 hours.   Radiology    Dg Chest Portable 1 View  Result Date: 02/08/2019 CLINICAL DATA:  AFib.  Chest pain.  Shortness of breath. EXAM: PORTABLE CHEST 1 VIEW COMPARISON:  Chest x-ray 01/18/2019. FINDINGS: Mediastinum and hilar structures normal. Cardiomegaly with normal pulmonary vascularity. No focal infiltrate. No pleural effusion noted on today's exam. Previously identified right  pleural effusion has cleared. IMPRESSION: 1. Cardiomegaly. No pulmonary venous congestion. No pleural effusion noted on today's exam. Previously identified right pleural effusion has cleared. 2.  No acute pulmonary disease. Electronically Signed   By: Marcello Moores  Register   On: 02/08/2019 15:19    Cardiac Studies   Myoview Stress Test 10/2018 Study Highlights    Nuclear stress EF: 57%.  There was no ST segment deviation noted during stress.  This is a low risk study.  The left ventricular ejection fraction is normal (55-65%).  Low risk stress test with no evidence of ischemia or infarction.    ECHO 05/16/2018 - Left ventricle: The cavity size was normal. Wall thickness was   increased in a pattern of severe LVH. Systolic function was   normal. The estimated ejection fraction was in the range of 60%   to 65%. Wall motion was normal; there were no regional wall   motion abnormalities. There was an increased relative   contribution of atrial contraction to ventricular filling.   Doppler parameters are consistent with abnormal left ventricular   relaxation (grade 1 diastolic dysfunction). Doppler parameters   are consistent with high ventricular filling pressure. - Aortic valve: Valve mobility was restricted. There was mild to   moderate stenosis. Mean gradient (S): 17 mm Hg. VTI ratio of LVOT   to aortic valve: 0.36. Valve area (VTI): 1.37 cm^2. Valve area   (Vmax): 1.22 cm^2. Valve area (Vmean): 1.27 cm^2. - Mitral valve: Calcified annulus. Valve area by pressure   half-time: 2.39 cm^2. - Left atrium: The atrium was moderately dilated. - Pulmonary arteries: Systolic pressure could not be accurately   estimated.  Patient Profile     68 y.o. female with a hx of hyperthyroidism, T2DM, CAD, PAF on Eliquis, HTN, HLD, OSA admitted for A.fib w/ RVR.  Assessment & Plan    1. AFib: first 2019 post-op from amputation for her diabetic ulcer. She had a successful cardioversion on  05/22/2018 with rhythm return to sinus. However, she returned to A.fib clinic on 02/15/19 and was found to be back in a.fib with worsening symptoms of dyspnea and chest discomfort. She was planned for cardioversion on 02/19/19 but it was cancelled due to her misunderstanding pre-op instructions and not taking her anticoagulation. CHADSVasc 5 (age, gender, DM, CAD, HTN). Blood pressure has been quite low and we will stop the hydralazine  and diltiazem to make sure that she receives her metoprolol.  Continue intravenous amiodarone for the time being until we are sure that she can take p.o. medications consistently. 2. CAD: She has a known chronic total occlusion of the right coronary artery and I do not think it surprising that she had angina during RVR.  Relatively mild increase in cardiac hsTropI seems to be peaking at about 548, consistent with mild demand injury.  2 v CAD with CTO of the RCA and high grade bifurcational LCx/OM stenosis. S/P PCI DES x 2 (3.50x12 promus DES w/ 0% resdual and Prox OM1 2.50x20 promus DES w/ 20% residual to the LCx/OM, 2016). Excellent LDL cholesterol, but also with very low HDL. 3. Hyperthyroidism: recent adjustment in methimazole dose due to suppressed TSH. Amiodarone interaction likely, makes for complicated management of her thyroid disorder. 4. CHF: No overt evidence of hypervolemia clinically, in fact she appears a little dehydrated which may explain her low blood pressure.  No evidence of venous congestion or pleural effusions on chest x-ray.  Elevated BNP, higher than values recorded in 2016 and 2017, but I do not think she needs diuretics. 5. OSA/COPD 6. S/p L BKA     For questions or updates, please contact [place] Please consult [email or web address] for contact info under        Signed, Sanda Klein, MD  02/21/2019, 11:18 AM

## 2019-02-24 NOTE — Progress Notes (Signed)
  Echocardiogram 2D Echocardiogram has been performed.  Judith [name] 03/10/2019, 12:49 PM

## 2019-02-24 NOTE — Op Note (Signed)
EP procedure Note   Pre procedure Diagnosis:  Persistent Atrial fibrillation/ atypical atrial flutter Post procedure Diagnosis:  Same  Procedures:  Electrical cardioversion  Description:  Informed, written consent was obtained for emergent cardioversion.  Adequate IV acces and airway support were assured.  The patient was adequately sedated with intravenous propofol as outlined in the anesthesia report.  The patient presented today in atrial fibrillation with RVR.  She was clinically in extremis with lethargy and was very ill.  She was successfully cardioverted to sinus rhythm with a single synchronized biphasic 200J shock delivered with cardioversion electrodes placed in the anterior/posterior configuration.  She remains in sinus rhythm thereafter. EBL 47ml. She became progressive hypotension requiring pressor support.  She also required intubation for presumed cardiogenic shock.  She was transferred to the ICU for further management.  Conclusions:  1.  Successful cardioversion of afib to sinus rhythm    Judith Grimmett,MD 4:13 PM 02/22/2019

## 2019-02-24 NOTE — Procedures (Signed)
Arterial Line Procedure Note Judith [name] [id number] 1950/06/20  Procedure: left radial arterial line Indications: acidosis, shock, need for frequent labs and continuous BP  Procedure Details Consent: Risks of procedure as well as the alternatives and risks of each were explained to the (patient/caregiver).  Consent for procedure obtained. Time Out: Verified patient identification, verified procedure, site/side was marked, verified correct patient position, special equipment/implants available, medications/allergies/relevent history reviewed, required imaging and test results available.  Performed  U/S directed aline placement with 20g Arrow catheter, left radial. Good waveform and flush. biopatch and sterile dressing. No complications.  Drugs:  100 mcg Fentanyl, 2 mg Versed,  Bonna Gains MD, PhD 02/25/2019 7:39 PM

## 2019-02-24 NOTE — Interval H&P Note (Signed)
History and Physical Interval Note:  02/12/2019 3:00 PM  Judith [name]  has presented today for surgery, with the diagnosis of a-fib, RVR.  The various methods of treatment have been discussed with the patient and family. After consideration of risks, benefits and other options for treatment, the patient has consented to  Procedure(s): CARDIOVERSION (N/A) as a surgical intervention.  The patient's history has been reviewed, patient examined, no change in status, stable for surgery.  I have reviewed the patient's chart and labs.  Questions were answered to the patient's satisfaction.     Thompson Grayer

## 2019-02-24 NOTE — Anesthesia Procedure Notes (Signed)
Procedure Name: Intubation Date/Time: 03/10/2019 3:49 PM Performed by: Suzy Bouchard, CRNA Pre-anesthesia Checklist: Patient identified, Emergency Drugs available, Suction available, Patient being monitored and Timeout performed Patient Re-evaluated:Patient Re-evaluated prior to induction Oxygen Delivery Method: Ambu bag Preoxygenation: Pre-oxygenation with 100% oxygen Laryngoscope Size: Miller and 2 Grade View: Grade I Tube type: Oral Tube size: 7.0 mm Number of attempts: 1 Airway Equipment and Method: Stylet Secured at: 22 cm Tube secured with: Tape Dental Injury: Teeth and Oropharynx as per pre-operative assessment

## 2019-02-24 NOTE — Progress Notes (Signed)
PROGRESS NOTE    Judith [name]  [id number] DOB: [birth year] DOA: 02/15/2019 PCP: Rutherford Guys, MD   Brief Narrative:  Judith [name] is a 68 y.o. female with medical history significant of paroxysmal atrial fibrillation status post cardioversion in January 2020, V. tach in the past, asthma, CKD stage III, hyperlipidemia, hypertension, hypothyroidism and several other comorbidities presented to ED with a complaint of chest pain for 2 to 4 days and shortness of breath for last 2 to 4 weeks.  Chest pain is pressure-like, nonradiating 8 out of 10 with no aggravating or relieving factor.  Dyspnea is exertional.  It is associated with nausea but no dizziness or diaphoresis.  Apparently, she was supposed to have cardioversion on 02/19/2019 but that was canceled since patient had missed her Eliquis dosage for 3 consecutive days prior to that due to some periprocedural misunderstanding.  This was rescheduled for 3 weeks.  Patient ended up in the emergency department on 03/10/2019.  Of note, patient was recently started on prednisone about 3 days ago for " COPD".  ED Course: Upon presentation to ED, patient blood pressure was fine however she was found to be in atrial fibrillation with RVR.  Her proBNP was elevated at 1119 (her baseline is around 400).  Her renal function was at baseline.  Chest x-ray showed cardiomegaly with normal pulmonary vascularity and no infiltrate.  Troponin was 375.  Cardiology was consulted and then hospital service for called to admit the patient.  Assessment & Plan:   Active Problems:   DM (diabetes mellitus), type 2 with peripheral vascular complications (HCC)   HTN (hypertension)   Depression with anxiety   Hyperthyroidism   Hyperlipidemia LDL goal <70   Atrial fibrillation with rapid ventricular response (HCC)   Type II diabetes mellitus, uncontrolled ([place])   CKD (chronic kidney disease), stage III   Paroxysmal atrial fibrillation with RVR: As mentioned above,  patient has a history of cardioversion several times according to patient.  Her cardioversion was canceled on 10/12 and this was rescheduled 3 weeks later due to her missing her Eliquis 3 days in a row prior to procedure.  She was initially started on Cardizem drip in the ED. allergy saw her and she was started on amiodarone drip as well.  She remains in atrial fibrillation.  On amiodarone drip this morning.  Now she is going for emergency cardioversion.  Acute on chronic diastolic congestive heart failure:  Received a dose of Lasix yesterday.  Looks dry.  No further diuretics at this point in time per cardiology recommendations.  Hypothyroidism: Patient's home medications is methimazole 10 mg.  According to patient, she saw her endocrinologist last week and her dose was increased to 15 mg.  She confirmed that she has been taking 1 and half tablet per day.  Her TSH was 0.098 and free T4 was 2.76, more than doubled the normal upper limit.    I increased her methimazole to 10 mg twice daily.  Continue that.  Elevated troponin: Likely due to being in RVR.  Follow serial cardiac enzymes.  Will order transthoracic echo.  Hyperlipidemia: Resume home dose of atorvastatin.  History of anxiety and depression: Resume home dose of Prozac.  Essential hypertension:  Blood pressure on low side.  Holding all antihypertensives.  Type 2 diabetes mellitus: She seems to be taking Tresiba 20 units at night.  Blood sugar control.  Continue Lantus 15 units and SSI.  DVT prophylaxis: Eliquis Code Status: Full code Family Communication:  None present at bedside.  Plan of care discussed with patient in length and he verbalized understanding and agreed with it. Disposition Plan: TBD  Estimated body mass index is 30.73 kg/m as calculated from the following:   Height as of this encounter: 5\' 4"  (1.626 m).   Weight as of this encounter: 81.2 kg.      Nutritional status:               Consultants:    Cardiology  Procedures:   None  Antimicrobials:   None   Subjective: Patient seen and examined twice today.  I saw her earlier at around 11:30 AM.  At that point in time, she was sitting in her chair, comfortable without requiring any oxygen did not complain anything to me.  Reportedly, she had some nausea this morning when she was seen by cardiology.  She was still in atrial fibrillation.  I was then called by a nurse around 2:30 PM stating that patient likely had V. tach and she was lethargic and complaining of shortness of breath.  RRT team was already at the bedside.  I arrived at the bedside, patient was lethargic however she was hemodynamically stable.  She was having atrial fibrillation with rates around 100.  Dr. Rayann Heman from cardiology was called at the bedside.  He is planning to take patient for emergency cardioversion at this point in time.  Objective: Vitals:   02/25/2019 1400 02/21/2019 1415 03/01/2019 1430 02/16/2019 1435  BP: 97/84 104/83 108/80 109/89  Pulse: (!) 101  100 (!) 104  Resp:      Temp: 97.8 F (36.6 C)     TempSrc: Oral     SpO2: 98%  97% 99%  Weight:      Height:        Intake/Output Summary (Last 24 hours) at 02/12/2019 1446 Last data filed at 03/06/2019 1300 Gross per 24 hour  Intake 371.85 ml  Output 550 ml  Net -178.15 ml   Filed Weights   02/15/2019 1445 02/20/2019 1858 02/14/2019 0500  Weight: 86.2 kg 86.1 kg 81.2 kg    Examination:  General exam: Appears calm and comfortable earlier but lethargic now. Respiratory system: Clear to auscultation. Respiratory effort normal. Cardiovascular system: S1 & S2 heard, irregularly irregular rate and rhythm. No JVD, murmurs, rubs, gallops or clicks. No pedal edema. Gastrointestinal system: Abdomen is nondistended, soft and nontender. No organomegaly or masses felt. Normal bowel sounds heard. Central nervous system: Lethargic but oriented. No focal neurological deficits. Extremities: Symmetric 5 x 5 power.  Skin: No rashes, lesions or ulcers Psychiatry: Judgement and insight appear normal. Mood & affect appropriate.    Data Reviewed: I have personally reviewed following labs and imaging studies  CBC: Recent Labs  Lab 03/06/2019 1459 02/22/2019 0331  WBC 10.8* 10.1  NEUTROABS 8.1*  --   HGB 10.1* 9.9*  HCT 32.3* 31.8*  MCV 86.1 85.7  PLT 356 0000000   Basic Metabolic Panel: Recent Labs  Lab 03/08/2019 1459 03/06/2019 1738 03/03/2019 0331  NA 129*  --  128*  K 4.2  --  4.7  CL 89*  --  91*  CO2 26  --  22  GLUCOSE 85  --  130*  BUN 31*  --  33*  CREATININE 1.37*  --  1.63*  CALCIUM 9.2  --  9.1  MG  --  1.9  --    GFR: Estimated Creatinine Clearance: 35 mL/min (A) (by C-G formula based on SCr of 1.63  mg/dL (H)). Liver Function Tests: Recent Labs  Lab 02/08/2019 1459 02/16/2019 1024  AST 93* 315*  ALT 149* 234*  ALKPHOS 153* 144*  BILITOT 1.0 1.3*  PROT 6.6 6.1*  ALBUMIN 3.3* 3.0*   No results for input(s): LIPASE, AMYLASE in the last 168 hours. No results for input(s): AMMONIA in the last 168 hours. Coagulation Profile: Recent Labs  Lab 02/19/2019 1644 02/25/2019 0331  INR 2.0* 3.2*   Cardiac Enzymes: No results for input(s): CKTOTAL, CKMB, CKMBINDEX, TROPONINI in the last 168 hours. BNP (last 3 results) Recent Labs    10/13/18 1057 01/18/19 1445  PROBNP 2,908* 2,645*   HbA1C: Recent Labs    02/11/2019 1954  HGBA1C 8.1*   CBG: Recent Labs  Lab 02/14/2019 2145 02/23/2019 0615 03/04/2019 1151 03/06/2019 1420  GLUCAP 199* 206* 111* 99   Lipid Profile: Recent Labs    02/12/2019 0331  CHOL 67  HDL 17*  LDLCALC 35  TRIG 76  CHOLHDL 3.9   Thyroid Function Tests: Recent Labs    02/22/2019 1459  TSH 0.098*  FREET4 2.76*   Anemia Panel: No results for input(s): VITAMINB12, FOLATE, FERRITIN, TIBC, IRON, RETICCTPCT in the last 72 hours. Sepsis Labs: No results for input(s): PROCALCITON, LATICACIDVEN in the last 168 hours.  Recent Results (from the past 240 hour(s))   Novel Coronavirus, NAA (Hosp order, Send-out to Ref Lab; TAT 18-24 hrs     Status: None   Collection Time: 02/15/19  1:52 PM   Specimen: Nasopharyngeal Swab; Respiratory  Result Value Ref Range Status   SARS-CoV-2, NAA NOT DETECTED NOT DETECTED Final    Comment: (NOTE) This nucleic acid amplification test was developed and its performance characteristics determined by [employer]. Nucleic acid amplification tests include PCR and TMA. This test has not been FDA cleared or approved. This test has been authorized by FDA under an Emergency Use Authorization (EUA). This test is only authorized for the duration of time the declaration that circumstances exist justifying the authorization of the emergency use of in vitro diagnostic tests for detection of SARS-CoV-2 virus and/or diagnosis of COVID-19 infection under section 564(b)(1) of the Act, 21 U.S.C. [id number]) (1), unless the authorization is terminated or revoked sooner. When diagnostic testing is negative, the possibility of a false negative result should be considered in the context of a patient's recent exposures and the presence of clinical signs and symptoms consistent with COVID-19. An individual without symptoms of COVID- 19 and who is not shedding SARS-CoV-2 vi rus would expect to have a negative (not detected) result in this assay. Performed At: [facility] [address] [place], [place] [id number] Rush Farmer MD [id number]    [place]  Final    Comment: Performed at [facility], [place] [address] [place], [place] [address]  SARS Coronavirus 2 by RT PCR (hospital order, performed in [facility] hospital lab) Nasopharyngeal Nasopharyngeal Swab     Status: None   Collection Time: 03/04/2019  5:25 PM   Specimen: Nasopharyngeal Swab  Result Value Ref Range Status   SARS Coronavirus 2 NEGATIVE NEGATIVE Final    Comment: (NOTE) If result is NEGATIVE SARS-CoV-2 target  nucleic acids are NOT DETECTED. The SARS-CoV-2 RNA is generally detectable in upper and lower  respiratory specimens during the acute phase of infection. The lowest  concentration of SARS-CoV-2 viral copies this assay can detect is 250  copies / mL. A negative result does not preclude SARS-CoV-2 infection  and should not be used  as the sole basis for treatment or other  patient management decisions.  A negative result may occur with  improper specimen collection / handling, submission of specimen other  than nasopharyngeal swab, presence of viral mutation(s) within the  areas targeted by this assay, and inadequate number of viral copies  (<250 copies / mL). A negative result must be combined with clinical  observations, patient history, and epidemiological information. If result is POSITIVE SARS-CoV-2 target nucleic acids are DETECTED. The SARS-CoV-2 RNA is generally detectable in upper and lower  respiratory specimens dur ing the acute phase of infection.  Positive  results are indicative of active infection with SARS-CoV-2.  Clinical  correlation with patient history and other diagnostic information is  necessary to determine patient infection status.  Positive results do  not rule out bacterial infection or co-infection with other viruses. If result is PRESUMPTIVE POSTIVE SARS-CoV-2 nucleic acids MAY BE PRESENT.   A presumptive positive result was obtained on the submitted specimen  and confirmed on repeat testing.  While 2019 novel coronavirus  (SARS-CoV-2) nucleic acids may be present in the submitted sample  additional confirmatory testing may be necessary for epidemiological  and / or clinical management purposes  to differentiate between  SARS-CoV-2 and other Sarbecovirus currently known to infect humans.  If clinically indicated additional testing with an alternate test  methodology [phone]) is advised. The SARS-CoV-2 RNA is generally  detectable in upper and lower  respiratory sp ecimens during the acute  phase of infection. The expected result is Negative. Fact Sheet for Patients:  StrictlyIdeas.no Fact Sheet for Healthcare Providers: BankingDealers.co.za This test is not yet approved or cleared by the [place] FDA and has been authorized for detection and/or diagnosis of SARS-CoV-2 by FDA under an Emergency Use Authorization (EUA).  This EUA will remain in effect (meaning this test can be used) for the duration of the COVID-19 declaration under Section 564(b)(1) of the Act, 21 U.S.C. section 360bbb-3(b)(1), unless the authorization is terminated or revoked sooner. Performed at [facility], [place] [address] [place], [place] [address]       Radiology Studies: Dg Chest Portable 1 View  Result Date: 03/01/2019 CLINICAL DATA:  AFib.  Chest pain.  Shortness of breath. EXAM: PORTABLE CHEST 1 VIEW COMPARISON:  Chest x-ray 01/18/2019. FINDINGS: Mediastinum and hilar structures normal. Cardiomegaly with normal pulmonary vascularity. No focal infiltrate. No pleural effusion noted on today's exam. Previously identified right pleural effusion has cleared. IMPRESSION: 1. Cardiomegaly. No pulmonary venous congestion. No pleural effusion noted on today's exam. Previously identified right pleural effusion has cleared. 2.  No acute pulmonary disease. Electronically Signed   By: Ovando   On: 02/27/2019 15:19    Scheduled Meds: . apixaban  5 mg Oral BID  . atorvastatin  20 mg Oral Daily  . budesonide (PULMICORT) nebulizer solution  0.25 mg Nebulization BID  . buPROPion  75 mg Oral BID  . famotidine  20 mg Oral Daily  . ferrous sulfate  325 mg Oral Daily  . FLUoxetine  20 mg Oral QHS  . insulin aspart  0-15 Units Subcutaneous TID WC  . insulin aspart  0-5 Units Subcutaneous QHS  . insulin glargine  15 Units Subcutaneous QHS  . loratadine  10 mg Oral QHS  . magnesium oxide  400 mg Oral QHS   . methimazole  10 mg Oral BID  . metoprolol succinate  75 mg Oral BID   Continuous Infusions: . amiodarone 30 mg/hr (03/02/2019 1045)  LOS: 1 day   Time spent: Spent total of 45 minutes including 15 minutes of critical time during second visit.   Darliss Cheney, MD [facility]  02/15/2019, 2:46 PM   To contact the attending provider between 7A-7P or the covering provider during after hours 7P-7A, please log into the web site [email or web address] and use password [email or web address].

## 2019-02-24 NOTE — Procedures (Signed)
Central line Procedure Note Judith [name] [id number] [birth year]  Procedure: RIJ triple lumen catheter Indications: cardiogenic shock, lactic acidosis  Procedure Details Consent: Risks of procedure as well as the alternatives and risks of each were explained to the (patient/caregiver).  Consent for procedure obtained. Time Out: Verified patient identification, verified procedure, site/side was marked, verified correct patient position, special equipment/implants available, medications/allergies/relevent history reviewed, required imaging and test results available.  Performed  Drugs:  200 mcg Fentanyl, 4 mg Versed,  Evaluation Hemodynamic Status: BP stable throughout; O2 sats: stable throughout Patient's Current Condition: stable Complications: No apparent complications Patient did tolerate procedure well. Chest X-ray ordered to verify placement.  CXR: line in good position  Bonna Gains, MD PhD 7:33 PM

## 2019-02-25 ENCOUNTER — Inpatient Hospital Stay (HOSPITAL_COMMUNITY): Payer: Medicare PPO

## 2019-02-25 DIAGNOSIS — N1832 Chronic kidney disease, stage 3b: Secondary | ICD-10-CM

## 2019-02-25 DIAGNOSIS — I739 Peripheral vascular disease, unspecified: Secondary | ICD-10-CM

## 2019-02-25 DIAGNOSIS — I5021 Acute systolic (congestive) heart failure: Secondary | ICD-10-CM

## 2019-02-25 DIAGNOSIS — K7200 Acute and subacute hepatic failure without coma: Secondary | ICD-10-CM

## 2019-02-25 DIAGNOSIS — I509 Heart failure, unspecified: Secondary | ICD-10-CM

## 2019-02-25 LAB — BASIC METABOLIC PANEL
Anion gap: 13 (ref 5–15)
BUN: 53 mg/dL — ABNORMAL HIGH (ref 8–23)
CO2: 27 mmol/L (ref 22–32)
Calcium: 8.7 mg/dL — ABNORMAL LOW (ref 8.9–10.3)
Chloride: 90 mmol/L — ABNORMAL LOW (ref 98–111)
Creatinine, Ser: 2.04 mg/dL — ABNORMAL HIGH (ref 0.44–1.00)
GFR calc Af Amer: 29 mL/min — ABNORMAL LOW (ref >60)
GFR calc non Af Amer: 25 mL/min — ABNORMAL LOW (ref >60)
Glucose, Bld: 155 mg/dL — ABNORMAL HIGH (ref 70–99)
Potassium: 4.3 mmol/L (ref 3.5–5.1)
Sodium: 130 mmol/L — ABNORMAL LOW (ref 135–145)

## 2019-02-25 LAB — COMPREHENSIVE METABOLIC PANEL
ALT: 1306 U/L — ABNORMAL HIGH (ref 0–44)
AST: 3843 U/L — ABNORMAL HIGH (ref 15–41)
Albumin: 2.9 g/dL — ABNORMAL LOW (ref 3.5–5.0)
Alkaline Phosphatase: 151 U/L — ABNORMAL HIGH (ref 38–126)
Anion gap: 15 (ref 5–15)
BUN: 44 mg/dL — ABNORMAL HIGH (ref 8–23)
CO2: 26 mmol/L (ref 22–32)
Calcium: 8.5 mg/dL — ABNORMAL LOW (ref 8.9–10.3)
Chloride: 89 mmol/L — ABNORMAL LOW (ref 98–111)
Creatinine, Ser: 2.12 mg/dL — ABNORMAL HIGH (ref 0.44–1.00)
GFR calc Af Amer: 27 mL/min — ABNORMAL LOW (ref >60)
GFR calc non Af Amer: 24 mL/min — ABNORMAL LOW (ref >60)
Glucose, Bld: 231 mg/dL — ABNORMAL HIGH (ref 70–99)
Potassium: 4.3 mmol/L (ref 3.5–5.1)
Sodium: 130 mmol/L — ABNORMAL LOW (ref 135–145)
Total Bilirubin: 1.7 mg/dL — ABNORMAL HIGH (ref 0.3–1.2)
Total Protein: 6.1 g/dL — ABNORMAL LOW (ref 6.5–8.1)

## 2019-02-25 LAB — CBC WITH DIFFERENTIAL/PLATELET
Abs Immature Granulocytes: 0.13 10*3/uL — ABNORMAL HIGH (ref 0.00–0.07)
Basophils Absolute: 0 10*3/uL (ref 0.0–0.1)
Basophils Relative: 0 %
Eosinophils Absolute: 0 10*3/uL (ref 0.0–0.5)
Eosinophils Relative: 0 %
HCT: 33.1 % — ABNORMAL LOW (ref 36.0–46.0)
Hemoglobin: 10.4 g/dL — ABNORMAL LOW (ref 12.0–15.0)
Immature Granulocytes: 1 %
Lymphocytes Relative: 2 %
Lymphs Abs: 0.3 10*3/uL — ABNORMAL LOW (ref 0.7–4.0)
MCH: 26.7 pg (ref 26.0–34.0)
MCHC: 31.4 g/dL (ref 30.0–36.0)
MCV: 84.9 fL (ref 80.0–100.0)
Monocytes Absolute: 0.7 10*3/uL (ref 0.1–1.0)
Monocytes Relative: 5 %
Neutro Abs: 13.1 10*3/uL — ABNORMAL HIGH (ref 1.7–7.7)
Neutrophils Relative %: 92 %
Platelets: 321 10*3/uL (ref 150–400)
RBC: 3.9 MIL/uL (ref 3.87–5.11)
RDW: 16.1 % — ABNORMAL HIGH (ref 11.5–15.5)
WBC: 14.3 10*3/uL — ABNORMAL HIGH (ref 4.0–10.5)
nRBC: 0.2 % (ref 0.0–0.2)

## 2019-02-25 LAB — LACTIC ACID, PLASMA
Lactic Acid, Venous: 2.2 mmol/L (ref 0.5–1.9)
Lactic Acid, Venous: 2.1 mmol/L (ref 0.5–1.9)
Lactic Acid, Venous: 2.1 mmol/L (ref 0.5–1.9)
Lactic Acid, Venous: 1.5 mmol/L (ref 0.5–1.9)
Lactic Acid, Venous: 2.3 mmol/L (ref 0.5–1.9)

## 2019-02-25 LAB — GLUCOSE, CAPILLARY
Glucose-Capillary: 190 mg/dL — ABNORMAL HIGH (ref 70–99)
Glucose-Capillary: 218 mg/dL — ABNORMAL HIGH (ref 70–99)
Glucose-Capillary: 134 mg/dL — ABNORMAL HIGH (ref 70–99)
Glucose-Capillary: 148 mg/dL — ABNORMAL HIGH (ref 70–99)
Glucose-Capillary: 167 mg/dL — ABNORMAL HIGH (ref 70–99)
Glucose-Capillary: 146 mg/dL — ABNORMAL HIGH (ref 70–99)

## 2019-02-25 LAB — PROTIME-INR
INR: 5.4 (ref 0.8–1.2)
Prothrombin Time: 48.1 seconds — ABNORMAL HIGH (ref 11.4–15.2)

## 2019-02-25 LAB — COOXEMETRY PANEL
Carboxyhemoglobin: 0.9 % (ref 0.5–1.5)
Carboxyhemoglobin: 1.2 % (ref 0.5–1.5)
Carboxyhemoglobin: 1.4 % (ref 0.5–1.5)
Methemoglobin: 0.7 % (ref 0.0–1.5)
Methemoglobin: 0.8 % (ref 0.0–1.5)
Methemoglobin: 0.9 % (ref 0.0–1.5)
O2 Saturation: 45 %
O2 Saturation: 64.1 %
O2 Saturation: 68.2 %
Total hemoglobin: 10.4 g/dL — ABNORMAL LOW (ref 12.0–16.0)
Total hemoglobin: 10.4 g/dL — ABNORMAL LOW (ref 12.0–16.0)
Total hemoglobin: 10.5 g/dL — ABNORMAL LOW (ref 12.0–16.0)

## 2019-02-25 MED ORDER — FUROSEMIDE 10 MG/ML IJ SOLN
80.0000 mg | Freq: Two times a day (BID) | INTRAMUSCULAR | Status: DC
  Administered 2019-02-25 – 2019-02-26 (×2): 80 mg via INTRAVENOUS
  Filled 2019-02-25 (×2): qty 8

## 2019-02-25 MED ORDER — INSULIN ASPART 100 UNIT/ML ~~LOC~~ SOLN
0.0000 [IU] | SUBCUTANEOUS | Status: DC
  Administered 2019-02-25: 2 [IU] via SUBCUTANEOUS
  Administered 2019-02-25: 3 [IU] via SUBCUTANEOUS
  Administered 2019-02-25: 2 [IU] via SUBCUTANEOUS
  Administered 2019-02-26: 3 [IU] via SUBCUTANEOUS
  Administered 2019-02-26 (×5): 2 [IU] via SUBCUTANEOUS
  Administered 2019-02-26 – 2019-02-27 (×2): 5 [IU] via SUBCUTANEOUS

## 2019-02-25 MED ORDER — HYDROCORTISONE NA SUCCINATE PF 100 MG IJ SOLR
100.0000 mg | Freq: Three times a day (TID) | INTRAMUSCULAR | Status: DC
  Administered 2019-02-25 – 2019-02-26 (×4): 100 mg via INTRAVENOUS
  Filled 2019-02-25 (×4): qty 2

## 2019-02-25 MED ORDER — VECURONIUM BROMIDE 10 MG IV SOLR
10.0000 mg | Freq: Once | INTRAVENOUS | Status: AC
  Administered 2019-02-26: 10 mg via INTRAVENOUS
  Filled 2019-02-25: qty 10

## 2019-02-25 MED ORDER — SODIUM CHLORIDE 0.9 % IV SOLN
INTRAVENOUS | Status: DC | PRN
  Administered 2019-02-25: 500 mL via INTRAVENOUS
  Administered 2019-03-01 – 2019-03-04 (×2): 250 mL via INTRAVENOUS

## 2019-02-25 MED ORDER — DOBUTAMINE IN D5W 4-5 MG/ML-% IV SOLN
1.0000 ug/kg/min | INTRAVENOUS | Status: DC
  Administered 2019-02-25: 2.5 ug/kg/min via INTRAVENOUS
  Administered 2019-02-27: 5 ug/kg/min via INTRAVENOUS
  Filled 2019-02-25 (×2): qty 250

## 2019-02-25 MED ORDER — FENTANYL BOLUS VIA INFUSION
50.0000 ug | Freq: Once | INTRAVENOUS | Status: AC
  Administered 2019-02-25: 50 ug via INTRAVENOUS
  Filled 2019-02-25: qty 50

## 2019-02-25 MED ORDER — HEPARIN (PORCINE) 25000 UT/250ML-% IV SOLN
1450.0000 [IU]/h | INTRAVENOUS | Status: DC
  Administered 2019-02-26: 1000 [IU]/h via INTRAVENOUS
  Administered 2019-02-27: 1150 [IU]/h via INTRAVENOUS
  Administered 2019-02-28: 1050 [IU]/h via INTRAVENOUS
  Administered 2019-03-01: 1100 [IU]/h via INTRAVENOUS
  Administered 2019-03-02: 1150 [IU]/h via INTRAVENOUS
  Administered 2019-03-03: 1400 [IU]/h via INTRAVENOUS
  Administered 2019-03-04 – 2019-03-06 (×5): 1550 [IU]/h via INTRAVENOUS
  Filled 2019-02-25 (×11): qty 250

## 2019-02-25 MED ORDER — MIDAZOLAM BOLUS VIA INFUSION
1.0000 mg | Freq: Once | INTRAVENOUS | Status: AC
  Administered 2019-02-25: 1 mg via INTRAVENOUS
  Filled 2019-02-25: qty 1

## 2019-02-25 MED ORDER — NOREPINEPHRINE 4 MG/250ML-% IV SOLN
0.0000 ug/min | INTRAVENOUS | Status: DC
  Administered 2019-02-25: 2 ug/min via INTRAVENOUS
  Administered 2019-02-26: 5 ug/min via INTRAVENOUS
  Filled 2019-02-25 (×2): qty 250

## 2019-02-25 MED FILL — Medication: Qty: 1 | Status: AC

## 2019-02-25 NOTE — Progress Notes (Signed)
ANTICOAGULATION CONSULT NOTE - Initial Consult  Pharmacy Consult for Heparin  Indication: atrial fibrillation  Allergies  Allergen Reactions  . Contrast Media [Iodinated Diagnostic Agents] Hives and Other (See Comments)    Spoke to patient, Iodine allergy is really IV contrast allergy.   [name] [Insulin Aspart] Shortness Of Breath and Other (See Comments)    "breathing problems"  . Codeine Nausea And Vomiting and Other (See Comments)    HIGH DOSES-SEVERE VOMITING  . Iodine Other (See Comments)    MUST HAVE BENADRYL PRIOR TO PROCEDURE AND RIGHT BEFORE TREATMENT TO COUNTERACT REACTION-BLISTERING REACTION DERMATOLOGICAL  . Penicillins Itching, Rash and Other (See Comments)    Has patient had a PCN reaction causing immediate rash, facial/tongue/throat swelling, SOB or lightheadedness with hypotension: no Has patient had a PCN reaction causing severe rash involving mucus membranes or skin necrosis: No Has patient had a PCN reaction that required hospitalization No Has patient had a PCN reaction occurring within the last 10 years: No If all of the above answers are "NO", then may proceed with Cephalosporin use.  CHEST SIZED RASH AND ITCHING   . Ace Inhibitors Cough  . Demerol [Meperidine] Nausea And Vomiting  . Dilaudid [Hydromorphone Hcl] Other (See Comments)    HEADACHE   . Neosporin [Neomycin-Bacitracin Zn-Polymyx] Itching, Rash and Other (See Comments)    MAKES REACTIONS WORSE WHEN USING AS PROPHYLACTIC  . Percocet [Oxycodone-Acetaminophen] Rash  . Tape Itching and Rash    Patient Measurements: Height: 5\' 4"  (162.6 cm) Weight: 188 lb 15 oz (85.7 kg) IBW/kg (Calculated) : 54.7 Heparin Dosing Weight: 75kg  Vital Signs: Temp: 98.2 F (36.8 C) (10/18 0727) Temp Source: Oral (10/18 0727) BP: 89/48 (10/18 0826) Pulse Rate: 67 (10/18 0826)  Labs: Recent Labs    02/11/2019 1644 03/02/2019 1738 02/23/2019 1954 02/11/2019 0331  03/10/2019 1712 02/08/2019 1931 02/24/19 2228  02/25/19 0250  HGB  --   --   --  9.9*   < > 10.1* 10.9* 11.2* 10.4*  HCT  --   --   --  31.8*   < > 33.0* 32.0* 33.0* 33.1*  PLT  --   --   --  353  --  357  --   --  321  LABPROT 22.5*  --   --  32.2*  --  55.8*  --   --  48.1*  INR 2.0*  --   --  3.2*  --  6.5*  --   --  5.4*  CREATININE  --   --   --  1.63*  --  2.29*  --   --  2.12*  TROPONINIHS 197* 476* 548*  --   --   --   --   --   --    < > = values in this interval not displayed.    Estimated Creatinine Clearance: 27.7 mL/min (A) (by C-G formula based on SCr of 2.12 mg/dL (H)).   Medical History: Past Medical History:  Diagnosis Date  . Abnormal EKG 07/31/2013  . Arthritis    "hands" (03/06/2015)  . Asthma   . Carpal tunnel syndrome, bilateral   . Chronic kidney disease   . Complication of anesthesia    slow to wake up. Pt sts she woke up during surgery many years ago.  . Coronary artery disease     2 v CAD with CTO of the RCA and high grade bifurcational LCx/OM stenosis. S/P PCI DES x 2 to the LCx/OM.  Marland Kitchen Diabetic  peripheral neuropathy ([place]) "since 1996"  . GERD (gastroesophageal reflux disease)   . Goiter   . Headache    migraines prior to menopause  . History of shingles 06/01/2013  . Hyperlipidemia LDL goal <70 10/13/2015  . Hypertension   . Hyperthyroidism   . Osteomyelitis of foot (HCC)    Right  . PAF (paroxysmal atrial fibrillation) ([place]) 04/29/2015   CHADS2VASC score of 5 now on Apixaban  . Pneumonia ~ 1976  . Sleep apnea    Bipap  . Tremors of nervous system   . Type II diabetes mellitus (HCC)    insulin dependent      Assessment: 68yof admitted for SOB in setting of Afib RVR was supposed to have DCCV but deferred because patient missed doses of apixaban prior.  Overnight she developed hypotension, cardiogenic shock required DCCV now maintaining SR on amiodarone drip and BP stable on dopamine 5-3 mcg/kg/min coox60 Thyrotoxicosis methimazole + hydrocortisone Previously on apixaban last dose 10/17  pm INR 5, LFTS 3800/1300, Lactic acid 5.7> 2.1 Cr 1.3> 2.1, no bleeding, h/h low stable, pltc 300s Plan to hold anticoagulation today and start heparin drip in AM   Goal of Therapy:  aPTT aptt 66-90 sec seconds Monitor platelets by anticoagulation protocol: Yes   Plan:  Stop apixaban - last dose was 10/17 pm Hold heparin today with INR 5 and shock liver Start heparin 10/19 am - Heparin 1000 uts/hr  aptt 6hr after start Daily CBC, aptt   Bonnita Nasuti Pharm.D. CPP, BCPS Clinical Pharmacist [phone] 02/25/2019 9:13 AM

## 2019-02-25 NOTE — Plan of Care (Signed)
  Problem: Clinical Measurements: Goal: Ability to maintain clinical measurements within normal limits will improve Outcome: Progressing Goal: Will remain free from infection Outcome: Progressing Goal: Diagnostic test results will improve Outcome: Progressing Goal: Cardiovascular complication will be avoided Outcome: Progressing   Problem: Coping: Goal: Level of anxiety will decrease Outcome: Progressing   Problem: Pain Managment: Goal: General experience of comfort will improve Outcome: Progressing   Problem: Safety: Goal: Ability to remain free from injury will improve Outcome: Progressing   Problem: Health Behavior/Discharge Planning: Goal: Ability to manage health-related needs will improve Outcome: Not Applicable

## 2019-02-25 NOTE — Progress Notes (Signed)
NAME:  Judith [name], MRN:  [id number], DOB:  26-Jul-1950, LOS: 2 ADMISSION DATE:  02/22/2019, CONSULTATION DATE:  02/11/2019 REFERRING MD:  Dr. Rayann Heman, CHIEF COMPLAINT:  Resp. Fail.   Brief History   68 y/o with multiple comorbidities, including paroxysmal afib. Admitted in afib w/ RVR with what appears to be developing CHF. Admitted j10/16, became more SOB today, still in AFib despite amiodarone and dilt. Taken for cardioversion but became more hypotensive, now requiring pressor support, intubated. Called for vent management; assistance with pressors  History of present illness   Judith [name] a 68 y.o.femalewith medical history significant ofparoxysmal atrial fibrillation status post several sessions of cardioversion, V. tach in the past, asthma, CKD stage III, hyperlipidemia, hypertension, hypothyroidism and several other comorbidities presented to ED with a complaint of chest pain and shortness of breath. Patient is a poor historian and she is very anxious currently. She tells me that she started having shortness of breath several weeks ago and started having chest pain about 2 to 4 days ago. According to her, shortness of breath is exertional and chest pain is intermittent which is dull and pressure-like, 8 out of 10 at its peak with no aggravating or relieving factor. Nonradiating. It is associated with intermittent nausea and shortness of breath but no dizziness or diaphoresis. Apparently, she was supposed to have cardioversion on 02/19/2019 but that was canceled since patient had missed her Eliquis dosage for 3 consecutive days prior to that. This was rescheduled for 3 weeks. Patient ended up in the emergency department. Of note, patient was recently started on prednisone about 3 days prior to admission for "COPD"however I do not see COPD as 1 of her past medical history.  Past Medical History  1) paroxysmal afib 2) CHF 3) COPD(?) 4) Hyperthyroidism 5) IDDM 6)  CRF  Significant Hospital Events   10/17 cardioversion 10/17 intubation          Consults:  Cardiology PCCM  Procedures:  10/17--aline 10/17 RIJ central line 10/17 cardioversion  Significant Diagnostic Tests:  10/18: ULTRASOUND ABDOMEN LIMITED RIGHT UPPER QUADRANT IMPRESSION: 1. Mild gallbladder wall thickening up to 7 mm, which could be related to incomplete distension. No other sonographic features for acute cholecystitis. 2. Previously seen cholelithiasis not visualized on today's exam. 3. No biliary dilatation or other acute finding.   10/17 TTE: IMPRESSIONS  1. Left ventricular ejection fraction, by visual estimation, is 30 to 35%. The left ventricle has moderate to severely decreased function. Normal left ventricular size. There is mildly increased left ventricular hypertrophy.  2. Wall motion anlysis was limited (parasternal and apical-4-chamber views only), but there is global LV hypokinesis with severe inferosptal hypokinesis.  3. Left ventricular diastolic function could not be evaluated due to atrial fibrillation.  4. Left atrial size was severely dilated.  5. Right atrial size was moderately dilated.  6. Moderate to severe mitral annular calcification.  7. The mitral valve is degenerative. Moderate mitral valve regurgitation.  8. The tricuspid valve is grossly normal. Tricuspid valve regurgitation mild-moderate.  9. The aortic valve is tricuspid. Mild to moderate aortic valve sclerosis/calcification. There is no aortic insufficiency. 10. Gradients underestimate the degree of aortic stenosis due to low stroke volume. Overall impression is of moderate aortic stenosis. 11. The right ventricle has mildly reduced global systolic function.The right ventricular size is normal. No increase in right ventricular wall thickness. 12. Compared to January 2020, left ventricular systolic function has deteriorated globally, but there also appears to be new inferoseptal  severe  hypokinesis, disproportionate to the remainder of the LV. Mitral regurgitation has worsened and aortic valve  gradients are lower due to poor cardiac output. Micro Data:  COVID neg HIV neg MRSA neg Antimicrobials:  none   Interim history/subjective:  No events o/n. Afebrile. Able to wean pressors.   Objective   Blood pressure 100/66, pulse 78, temperature 98.2 F (36.8 C), temperature source Oral, resp. rate 20, height 5\' 4"  (1.626 m), weight 85.7 kg, SpO2 99 %. CVP:  [10 mmHg-25 mmHg] 16 mmHg  Vent Mode: PRVC FiO2 (%):  [21 %-100 %] 40 % Set Rate:  [16 bmp-20 bmp] 20 bmp Vt Set:  [320 mL-430 mL] 430 mL PEEP:  [5 cmH20-8 cmH20] 8 cmH20 Plateau Pressure:  [20 cmH20-27 cmH20] 20 cmH20   Intake/Output Summary (Last 24 hours) at 02/25/2019 0809 Last data filed at 02/25/2019 0700 Gross per 24 hour  Intake 1467.87 ml  Output 825 ml  Net 642.87 ml   Filed Weights   03/08/2019 1858 02/17/2019 0500 02/25/19 0500  Weight: 86.1 kg 81.2 kg 85.7 kg    Examination: General: NAD, sedated HENT: AT/[place]; ETT midline no periorbital edema Lungs: CTA broadly, decreased BS in bases Cardiovascular: RRR, III/VI SEM at 4th, LSB Abdomen: SND Extremities: no edema or lesion Neuro: sedated. PERRL; downgoing toes on LLE GU: foley cath in place  Resolved Hospital Problem list   Afib--cardioverted, in sinus, amiodarone still loading  Assessment & Plan:  Multiple comorbidities including Afib, cardioverted with subsequent hypotension likely cardiogenic shock, respiratory status questionable in the setting of exam, acidosis.   Hypotension, likely cardiogenic 1) Still requiring pressors to maintain MAP>60. Consider transition from dopamine to Levophed 2) lactate clearing;  3) will discuss LT option with our cardiology colleagues. Daughter asking re: options of ablation to control afib; with EF 30%, need long term CHF monitoring?  Acidosis: Can d/c bicarb  F/E/N: NPO for now, correct  electrolytes, monitor fluid status and diurese if necessary- 1) hyponatremia gradually improving 2) LFTs elevation ? Perfusion; will follow.   Acute respiratory distress: Patient intubated, sedated.  Patient appeared to be coherent enough for extubation earlier, but given her acidosis we will continue mechanical ventilation, manage her acidosis, wean and extubate as able.  Atrial fibrillation with RVR. Continue amiodarone drip  IDDM: POC glucose monitoring, SSI  Chronic renal insufficiency; -monitor urine output, labs  Best practice:  Diet: NPO Pain/Anxiety/Delirium protocol (if indicated): wean sedation today  VAP protocol (if indicated): in place DVT prophylaxis: eliquis, SCDs GI prophylaxis: per protocol Glucose control: SSI Mobility: bed Code Status: full Family Communication: daughter at bedside, discussion of plan Disposition: ICU  Labs   CBC: Recent Labs  Lab 02/09/2019 1459 02/13/2019 0331 03/09/2019 1706 02/23/2019 1712 02/21/2019 1931 02/23/2019 2228 02/25/19 0250  WBC 10.8* 10.1  --  14.5*  --   --  14.3*  NEUTROABS 8.1*  --   --   --   --   --  13.1*  HGB 10.1* 9.9* 10.9* 10.1* 10.9* 11.2* 10.4*  HCT 32.3* 31.8* 32.0* 33.0* 32.0* 33.0* 33.1*  MCV 86.1 85.7  --  86.6  --   --  84.9  PLT 356 353  --  357  --   --  [id number]    Basic Metabolic Panel: Recent Labs  Lab 03/08/2019 1459 03/05/2019 1738 02/25/2019 0331 02/19/2019 1706 02/14/2019 1712 02/09/2019 1931 03/01/2019 2228 02/25/19 0250  NA 129*  --  128* 128* 129* 129* 131* 130*  K 4.2  --  4.7 5.0 5.1 4.8 4.6 4.3  CL 89*  --  91*  --  92*  --   --  89*  CO2 26  --  22  --  17*  --   --  26  GLUCOSE 85  --  130*  --  87  --   --  231*  BUN 31*  --  33*  --  39*  --   --  44*  CREATININE 1.37*  --  1.63*  --  2.29*  --   --  2.12*  CALCIUM 9.2  --  9.1  --  8.6*  --   --  8.5*  MG  --  1.9  --   --  2.0  --   --   --   PHOS  --   --   --   --  5.7*  --   --   --    GFR: Estimated Creatinine Clearance: 27.7 mL/min  (A) (by C-G formula based on SCr of 2.12 mg/dL (H)). Recent Labs  Lab 03/06/2019 1459 02/08/2019 0331 02/23/2019 1712 02/23/2019 2114 02/25/19 0011 02/25/19 0250 02/25/19 0252  WBC 10.8* 10.1 14.5*  --   --  14.3*  --   LATICACIDVEN  --   --  5.7* 3.0* 2.2*  --  2.1*    Liver Function Tests: Recent Labs  Lab 03/01/2019 1459 02/23/2019 1024 02/25/19 0250  AST 93* 315* 3,843*  ALT 149* 234* 1,306*  ALKPHOS 153* 144* 151*  BILITOT 1.0 1.3* 1.7*  PROT 6.6 6.1* 6.1*  ALBUMIN 3.3* 3.0* 2.9*   No results for input(s): LIPASE, AMYLASE in the last 168 hours. No results for input(s): AMMONIA in the last 168 hours.  ABG    Component Value Date/Time   PHART 7.429 02/27/2019 2228   PCO2ART 40.1 03/06/2019 2228   PO2ART 116.0 (H) 03/05/2019 2228   HCO3 26.6 02/27/2019 2228   TCO2 28 03/03/2019 2228   ACIDBASEDEF 5.0 (H) 02/20/2019 1706   O2SAT 64.1 02/25/2019 0255     Coagulation Profile: Recent Labs  Lab 02/14/2019 1644 02/16/2019 0331 03/08/2019 1712 02/25/19 0250  INR 2.0* 3.2* 6.5* 5.4*    Cardiac Enzymes: No results for input(s): CKTOTAL, CKMB, CKMBINDEX, TROPONINI in the last 168 hours.  HbA1C: Hemoglobin A1C  Date/Time Value Ref Range Status  02/13/2019 01:10 PM 7.3 (A) 4.0 - 5.6 % Final   Hgb A1c MFr Bld  Date/Time Value Ref Range Status  02/25/2019 07:54 PM 8.1 (H) 4.8 - 5.6 % Final    Comment:    (NOTE) Pre diabetes:          5.7%-6.4% Diabetes:              >6.4% Glycemic control for   <7.0% adults with diabetes   10/18/2018 11:35 AM 6.6 (H) 4.8 - 5.6 % Final    Comment:             Prediabetes: 5.7 - 6.4          Diabetes: >6.4          Glycemic control for adults with diabetes: <7.0     CBG: Recent Labs  Lab 03/10/2019 1420 02/19/2019 1522 03/03/2019 1711 02/24/19 2125 02/25/19 0402  GLUCAP 99 84 87 122* 190*    Review of Systems:   N/a secondary to intubation, sedation  Past Medical History  She,  has a past medical history of Abnormal EKG  (07/31/2013), Arthritis, Asthma, Carpal tunnel syndrome, bilateral,  Chronic kidney disease, Complication of anesthesia, Coronary artery disease, Diabetic peripheral neuropathy ([place]) ("since 1996"), GERD (gastroesophageal reflux disease), Goiter, Headache, History of shingles (06/01/2013), Hyperlipidemia LDL goal <70 (10/13/2015), Hypertension, Hyperthyroidism, Osteomyelitis of foot ([place]), PAF (paroxysmal atrial fibrillation) ([place]) (04/29/2015), Pneumonia (~ 1976), Sleep apnea, Tremors of nervous system, and Type II diabetes mellitus ([place]).   Surgical History    Past Surgical History:  Procedure Laterality Date   ABDOMINAL HYSTERECTOMY  71   age 66; CERVICAL DYSPLASIA; ovaries intact.    AMPUTATION Right 01/23/2016   Procedure: Right 3rd Ray Amputation;  Surgeon: [name], MD;  Location: [facility];  Service: Orthopedics;  Laterality: Right;   AMPUTATION Right 02/13/2016   Procedure: Right Transmetatarsal Amputation;  Surgeon: [name], MD;  Location: [facility];  Service: Orthopedics;  Laterality: Right;   AMPUTATION Right 04/15/2018   Procedure: AMPUTATION BELOW KNEE;  Surgeon: [name], MD;  Location: [facility];  Service: Orthopedics;  Laterality: Right;   BIOPSY  04/16/2018   Procedure: BIOPSY;  Surgeon: Irene Shipper, MD;  Location[facility] END[facility]COPY;  Service: Endoscopy;;   CARDIAC CATHETERIZATION N/A 02/27/2015   Procedure: Left Heart Cath and Coronary Angiography;  Surgeon: [name], MD; LAD 40%, mCFX 80%, OM 70%, RCA 100% calcified        CARDIAC CATHETERIZATION N/A 03/06/2015   Procedure: Coronary Stent Intervention;  Surgeon: [name], MD;  Location: [facility];  Service: Cardiovascular;  Laterality: N/A;  Mid CX 3.50x12 promus DES w/ 0% resdual and Prox OM1 2.50x20 promus DES w/ 20% residual   CARDIOVERSION     CARDIOVERSION N/A 05/22/2018   Procedure: CARDIOVERSION;  Surgeon: Skeet Latch, MD;  Location: [facility];  Service: Cardiovascular;  Laterality:  N/A;   CARPAL TUNNEL RELEASE Right Nov 2015   CARPAL TUNNEL RELEASE Right 1992; 05/2014   [name]; [place]; 1984   ESOPHAGOGASTRODUODENOSCOPY (EGD) WITH PROPOFOL N/A 04/16/2018   Procedure: ESOPHAGOGASTRODUODENOSCOPY (EGD) WITH PROPOFOL;  Surgeon: Irene Shipper, MD;  Location: [facility] ENDOSCOPY;  Service: Endoscopy;  Laterality: N/A;   FOOT NEUROMA SURGERY Bilateral 2000   I&D EXTREMITY Left 01/06/2018   Procedure: DEBRIDEMENT ULCER LEFT FOOT;  Surgeon: [name], MD;  Location: [facility];  Service: Orthopedics;  Laterality: Left;   KNEE ARTHROSCOPY Right ~ 2003   "meniscus repair"   LIPOMA EXCISION Right 2020   thigh   SHOULDER OPEN ROTATOR CUFF REPAIR Right 1996; 1998   "w/fracture repair"   THYROID SURGERY  2000   "removed lots of nodules"   TONSILLECTOMY  1976     Social History   reports that she quit smoking about 3 years ago. Her smoking use included cigarettes. She smoked 0.00 packs per day for 41.00 years. She has never used smokeless tobacco. She reports that she does not drink alcohol or use drugs.   Family History   Her family history includes Allergies in her sister; Breast cancer in her maternal grandmother and mother; COPD in her father; Cancer (age of onset: 5) in her mother; Emphysema in her maternal grandfather and paternal grandfather; Heart attack in her father; Heart disease (age of onset: 41) in her father; Hypertension in her father; Leukemia in her paternal grandmother; Lung cancer in her mother; Parkinson's disease in her father. There is no history of Thyroid disease, Colon cancer, Stomach cancer, Rectal cancer, or Esophageal cancer.   Allergies Allergies  Allergen Reactions   Contrast Media [Iodinated Diagnostic Agents]  Hives and Other (See Comments)    Spoke to patient, Iodine allergy is really IV contrast allergy.    Novolog [Insulin Aspart] Shortness Of Breath and Other (See Comments)    "breathing problems"   Codeine  Nausea And Vomiting and Other (See Comments)    HIGH DOSES-SEVERE VOMITING   Iodine Other (See Comments)    MUST HAVE BENADRYL PRIOR TO PROCEDURE AND RIGHT BEFORE TREATMENT TO COUNTERACT REACTION-BLISTERING REACTION DERMATOLOGICAL   Penicillins Itching, Rash and Other (See Comments)    Has patient had a PCN reaction causing immediate rash, facial/tongue/throat swelling, SOB or lightheadedness with hypotension: no Has patient had a PCN reaction causing severe rash involving mucus membranes or skin necrosis: No Has patient had a PCN reaction that required hospitalization No Has patient had a PCN reaction occurring within the last 10 years: No If all of the above answers are "NO", then may proceed with Cephalosporin use.  CHEST SIZED RASH AND ITCHING    Ace Inhibitors Cough   Demerol [Meperidine] Nausea And Vomiting   Dilaudid [Hydromorphone Hcl] Other (See Comments)    HEADACHE    Neosporin [Neomycin-Bacitracin Zn-Polymyx] Itching, Rash and Other (See Comments)    MAKES REACTIONS WORSE WHEN USING AS PROPHYLACTIC   Percocet [Oxycodone-Acetaminophen] Rash   Tape Itching and Rash     Home Medications  Prior to Admission medications   Medication Sig Start Date End Date Taking? Authorizing Provider  albuterol (PROVENTIL HFA) 108 (90 Base) MCG/ACT inhaler Inhale 1-2 puffs into the lungs every 6 (six) hours as needed for wheezing or shortness of breath. 05/01/18  Yes Angiulli, Lavon Paganini, PA-C  amiodarone (PACERONE) 200 MG tablet Take 1 tablet (200 mg total) by mouth daily. 06/08/18  Yes Sherran Needs, NP  atorvastatin (LIPITOR) 20 MG tablet Take 1 tablet (20 mg total) by mouth daily. 05/01/18  Yes Angiulli, Lavon Paganini, PA-C  buPROPion (WELLBUTRIN) 75 MG tablet Take 75 mg by mouth 2 (two) times daily.   Yes [provider]  cetirizine (ZYRTEC) 10 MG tablet Take 10 mg by mouth daily as needed for allergies.    Yes [provider]  diltiazem (CARTIA XT) 120 MG 24 hr  capsule Take 1 capsule (120 mg total) by mouth daily. 02/12/19  Yes Bhagat, Bhavinkumar, PA  ELIQUIS 5 MG TABS tablet Take 1 tablet by mouth twice daily Patient taking differently: Take 5 mg by mouth 2 (two) times daily.  11/21/18  Yes Turner, Eber Hong, MD  famotidine (PEPCID) 20 MG tablet Take 1 tablet (20 mg total) by mouth 2 (two) times daily. Patient taking differently: Take 40 mg by mouth daily.  06/17/18  Yes Rutherford Guys, MD  ferrous sulfate 325 (65 FE) MG tablet Take 325 mg by mouth daily as needed (Low iron).   Yes [provider]  FLUoxetine (PROZAC) 20 MG capsule TAKE 1 CAPSULE AT BEDTIME Patient taking differently: Take 20 mg by mouth at bedtime.  01/03/19  Yes Rutherford Guys, MD  fluticasone (FLOVENT HFA) 44 MCG/ACT inhaler Inhale 2 puffs into the lungs 2 (two) times daily. Patient taking differently: Inhale 2 puffs into the lungs daily as needed (Allergies).  01/22/19  Yes Rutherford Guys, MD  furosemide (LASIX) 40 MG tablet Take 1 tablet (40 mg total) by mouth daily. 01/18/19  Yes Rutherford Guys, MD  hydrALAZINE (APRESOLINE) 50 MG tablet Take 1 tablet (50 mg total) by mouth 3 (three) times daily. 10/13/18  Yes Isaiah Serge, NP  insulin  degludec (TRESIBA FLEXTOUCH) 100 UNIT/ML SOPN FlexTouch Pen Inject 20 Units into the skin at bedtime.    Yes [provider]  ketoconazole (NIZORAL) 2 % cream Apply 1 application topically daily. 02/21/19  Yes Maximiano Coss, NP  loratadine (CLARITIN) 10 MG tablet Take 10 mg by mouth at bedtime.   Yes [provider]  magnesium oxide (MAG-OX) 400 MG tablet Take 400 mg by mouth at bedtime.    Yes [provider]  Melatonin 10 MG TABS Take 10 mg by mouth at bedtime as needed (Sleep).    Yes [provider]  metFORMIN (GLUCOPHAGE-XR) 500 MG 24 hr tablet Take 1,000 mg by mouth daily with breakfast.   Yes [provider]  methimazole (TAPAZOLE) 10 MG tablet TAKE 1 TABLET (10 MG TOTAL) BY MOUTH  DAILY. Patient taking differently: Take 10 mg by mouth daily.  03/10/2019  Yes Renato Shin, MD  metoprolol succinate (TOPROL-XL) 25 MG 24 hr tablet Take 50 mg by mouth 2 (two) times daily.    Yes [provider]  mupirocin ointment (BACTROBAN) 2 % Apply 1 application topically 2 (two) times daily as needed (wound/sores).    Yes [provider]  nitroGLYCERIN (NITROSTAT) 0.4 MG SL tablet Place 0.4 mg under the tongue every 5 (five) minutes x 3 doses as needed for chest pain.    Yes [provider]  polyethylene glycol powder (GLYCOLAX/MIRALAX) 17 GM/SCOOP powder Take 17 g by mouth daily as needed (constipation.).    Yes [provider]  potassium chloride (K-DUR) 10 MEQ tablet Take 1 tablet (10 mEq total) by mouth 2 (two) times daily. Patient taking differently: Take 10 mEq by mouth every other day.  01/18/19  Yes Rutherford Guys, MD  triamcinolone (KENALOG) 0.1 % paste Use as directed 1 application in the mouth or throat 2 (two) times daily as needed (ulcer lesion.). 02/05/19  Yes Rutherford Guys, MD  TRUE METRIX BLOOD GLUCOSE TEST test strip TEST BLOOD SUGAR TWICE DAILY 09/16/18  Yes Renato Shin, MD     Critical care time: I have independently seen and examined the patient, reviewed data, and developed an assessment and plan . A total of 42 minutes were spent in critical care assessment and medical decision making. This critical care time does not reflect procedure time, or teaching time or supervisory time of PA/NP/Med student/Med Resident, etc but could involve care discussion time. Agree with the documentation above.   Bonna Gains, MD PhD 02/25/19 8:31 AM

## 2019-02-25 NOTE — TOC Initial Note (Signed)
Transition of Care [facility]) - Initial/Assessment Note    Patient Details  Name: Judith [name] MRN: [id number] Date of Birth: 03/21/1951  [facility]) CM/SW Contact:    Claudie Leach, RN Phone Number: 02/25/2019, 3:56 PM  Clinical Narrative:                 Pt from home and currently sedated on vent.  Pt lives with sister.  She requires assistance and DME to perform ADLs due to leg weakness.    CM will continue to follow as patient is high risk for readmission due to number of prescriptions and ED/hospital visits.   Expected Discharge Plan: Skilled Nursing Facility Barriers to Discharge: Continued Medical Work up   Expected Discharge Plan and Services Expected Discharge Plan: [place]       Prior Living Arrangements/Services   Patient language and need for interpreter reviewed:: Yes        Need for Family Participation in Patient Care: Yes (Comment) Care giver support system in place?: Yes (comment) Current home services: DME Criminal Activity/Legal Involvement Pertinent to Current Situation/Hospitalization: No - Comment as needed  Activities of Daily Living Home Assistive Devices/Equipment: Other (Comment)(unknown) ADL Screening (condition at time of admission) Patient's cognitive ability adequate to safely complete daily activities?: Yes Is the patient deaf or have difficulty hearing?: No Does the patient have difficulty seeing, even when wearing glasses/contacts?: No Does the patient have difficulty concentrating, remembering, or making decisions?: No Patient able to express need for assistance with ADLs?: Yes Does the patient have difficulty dressing or bathing?: No Independently performs ADLs?: No Communication: Independent Dressing (OT): Needs assistance Is this a change from baseline?: Pre-admission baseline Grooming: Needs assistance Is this a change from baseline?: Pre-admission baseline Feeding: Independent Bathing: Needs  assistance Is this a change from baseline?: Pre-admission baseline Toileting: Independent In/Out Bed: Independent Walks in Home: Independent Does the patient have difficulty walking or climbing stairs?: Yes Weakness of Legs: Both Weakness of Arms/Hands: None   Emotional Assessment       Orientation: : (sedated) Alcohol / Substance Use: Not Applicable Psych Involvement: No (comment)  Admission diagnosis:  Atrial fibrillation with rapid ventricular response ([place]) [I48.91] Patient Active Problem List   Diagnosis Date Noted  . Diarrhea 09/05/2018  . Urinary tract infection 06/30/2018  . ICH (intracerebral hemorrhage) ([place]) 06/27/2018  . DKA (diabetic ketoacidoses) ([place]) 06/26/2018  . V-tach ([place]) 05/14/2018  . Seizure ([place]) 05/14/2018  . Slow transit constipation   . Fall   . Labile blood glucose   . Labile blood pressure   . Chronic combined systolic and diastolic congestive heart failure ([place])   . Chronic diastolic congestive heart failure ([place])   . CKD (chronic kidney disease), stage III   . Type 2 diabetes mellitus with peripheral neuropathy (H[facility])   . Amputation of right lower extremity below knee ([place]) 04/20/2018  . 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 Post-operative pain   . PAF (paroxysmal atrial fibrillation) ([place])   . Duodenal ulcer   . Atrial fibrillation with RVR ([place]) 04/14/2018  . Foot osteomyelitis, right ([place]) 04/14/2018  . Cutaneous abscess of left foot   . Acute respiratory failure with hypoxia ([place])   . Cellulitis 01/02/2018  . LGI bleed   . Acute blood loss anemia   . Wide-complex tachycardia ([place]) 07/04/2017  . Type II diabetes mellitus, uncontrolled ([place]) 07/04/2017  . Peripheral neuropathy 07/04/2017  .  H/O hyperthyroidism 07/04/2017  . Atrial fibrillation with rapid ventricular response ([place]) 08/06/2016  . S/P transmetatarsal amputation of foot, right ([place]) 04/19/2016  .  Obstructive sleep apnea 11/26/2015  . Bilateral carpal tunnel syndrome 11/26/2015  . Hypomagnesemia 11/16/2015  . Hyperlipidemia LDL goal <70 10/13/2015  . Abnormality of gait 09/02/2015  . Memory loss 08/12/2015  . Diabetic peripheral neuropathy ([place]) 08/12/2015  . Vitamin D deficiency 08/12/2015  . Hyperthyroidism 04/30/2015  . Heme positive stool   . Persistent atrial fibrillation ([place]) 04/29/2015  . Coronary artery disease with stable angina pectoris ([place]) 03/29/2015  . Abnormal nuclear stress test   . History of goiter 09/28/2014  . GERD (gastroesophageal reflux disease) 07/31/2013  . Depression with anxiety 06/01/2013  . DM (diabetes mellitus), type 2 with peripheral vascular complications ([place]) [id number]  . HTN (hypertension) 01/30/2013   PCP:  Rutherford Guys, MD Pharmacy:   [facility], [address] [place] [address] Phone: [phone] Fax: [facility] [place], [place] [address] Phone: [phone] Fax: [phone]

## 2019-02-25 NOTE — Progress Notes (Signed)
Progress Note   Subjective   Intubated,  Remains in sinus.  Hypotensive, requiring dopamine.  Sister is at bedside.  Inpatient Medications    Scheduled Meds: . atorvastatin  20 mg Per Tube Daily  . budesonide (PULMICORT) nebulizer solution  0.25 mg Nebulization BID  . buPROPion  75 mg Per Tube BID  . chlorhexidine gluconate (MEDLINE KIT)  15 mL Mouth Rinse BID  . Chlorhexidine Gluconate Cloth  6 each Topical Daily  . famotidine  20 mg Per Tube Daily  . ferrous sulfate  325 mg Oral Daily  . FLUoxetine  20 mg Per Tube QHS  . hydrocortisone sod succinate (SOLU-CORTEF) inj  100 mg Intravenous Q8H  . insulin aspart  0-15 Units Subcutaneous Q4H  . insulin glargine  15 Units Subcutaneous QHS  . loratadine  10 mg Per Tube QHS  . magnesium oxide  400 mg Per Tube QHS  . mouth rinse  15 mL Mouth Rinse 10 times per day  . methimazole  10 mg Per Tube BID  . sodium chloride flush  10-40 mL Intracatheter Q12H  . Thrombi-Pad  1 each Topical Once   Continuous Infusions: . amiodarone 30 mg/hr (02/25/19 0800)  . DOPamine 3.5 mcg/kg/min (02/25/19 0829)  . fentaNYL infusion INTRAVENOUS 50 mcg/hr (02/25/19 0800)  . midazolam 1 mg/hr (02/25/19 0800)  . phenylephrine (NEO-SYNEPHRINE) Adult infusion Stopped (02/16/2019 1854)   PRN Meds: acetaminophen, albuterol, bisacodyl, docusate, fentaNYL, fentaNYL (SUBLIMAZE) injection, Melatonin, midazolam, midazolam, nitroGLYCERIN, ondansetron (ZOFRAN) IV, polyethylene glycol, sodium chloride flush, triamcinolone   Vital Signs    Vitals:   02/25/19 0700 02/25/19 0727 02/25/19 0800 02/25/19 0826  BP: 100/66 100/66 94/67 (!) 89/48  Pulse: 65 78 64 67  Resp: (!) 0 _0 Temp:  98.2 F (36.8 C)    TempSrc:  Oral    SpO2: 98% 99% 98% 100%  Weight:      Height:        Intake/Output Summary (Last 24 hours) at 02/25/2019 0930 Last data filed at 02/25/2019 0800 Gross per 24 hour  Intake 1546.79 ml  Output 750 ml  Net 796.79 ml   Filed Weights    02/16/2019 1858 02/27/2019 0500 02/25/19 0500  Weight: 86.1 kg 81.2 kg 85.7 kg    Telemetry    sinus - Personally Reviewed  Physical Exam   GEN- The patient is ill appearing, sedated on vent Head- normocephalic, atraumatic Eyes-  Sclera clear, conjunctiva pink Ears- hearing intact Oropharynx- ETT Neck- supple, Lungs- respirations are stable on vent Heart- Regular rate and rhythm  Psych- intubated Neuro- intubated   Labs    Chemistry Recent Labs  Lab 02/25/2019 1459 02/09/2019 0331 02/12/2019 1024  02/09/2019 1712 02/10/2019 1931 03/01/2019 2228 02/25/19 0250  NA 129* 128*  --    < > 129* 129* 131* 130*  K 4.2 4.7  --    < > 5.1 4.8 4.6 4.3  CL 89* 91*  --   --  92*  --   --  89*  CO2 26 22  --   --  17*  --   --  26  GLUCOSE 85 130*  --   --  87  --   --  231*  BUN 31* 33*  --   --  39*  --   --  44*  CREATININE 1.37* 1.63*  --   --  2.29*  --   --  2.12*  CALCIUM 9.2 9.1  --   --  8.6*  --   --  8.5*  PROT 6.6  --  6.1*  --   --   --   --  6.1*  ALBUMIN 3.3*  --  3.0*  --   --   --   --  2.9*  AST 93*  --  315*  --   --   --   --  3,843*  ALT 149*  --  234*  --   --   --   --  1,306*  ALKPHOS 153*  --  144*  --   --   --   --  151*  BILITOT 1.0  --  1.3*  --   --   --   --  1.7*  GFRNONAA 40* 32*  --   --  22*  --   --  24*  GFRAA 46* 38*  --   --  25*  --   --  27*  ANIONGAP 14 15  --   --  20*  --   --  15   < > = values in this interval not displayed.     Hematology Recent Labs  Lab 02/10/2019 0331  03/01/2019 1712 02/25/2019 1931 02/20/2019 2228 02/25/19 0250  WBC 10.1  --  14.5*  --   --  14.3*  RBC 3.71*  --  3.81*  --   --  3.90  HGB 9.9*   < > 10.1* 10.9* 11.2* 10.4*  HCT 31.8*   < > 33.0* 32.0* 33.0* 33.1*  MCV 85.7  --  86.6  --   --  84.9  MCH 26.7  --  26.5  --   --  26.7  MCHC 31.1  --  30.6  --   --  31.4  RDW 15.9*  --  16.2*  --   --  16.1*  PLT 353  --  357  --   --  321   < > = values in this interval not displayed.     Assessment & Plan    1.   Persistent afib She has decompensated CHF secondary to afib with RVR.  She is now in sinus rhythm after emergent cardioversion yesterday.  Our options are very limited.  She has severe LA enlargement as well as ongoing active hyperthyroidism.  Unfortunately, our only AAD option is amiodarone.  She is presently too sick for EP procedures. Continue amiodarone for now.  If she remains in sinus, we could entertain the idea of afib ablation in 6-8 weeks (though I have little optimism for success long term).  If she returns to afib in the interim, I would favor AV nodal ablation with resynchronization therapy instead.  She would have to make substantial improvement from her current state before she would be a candidate for either.  I have discussed these options at length with her sister at bedside today.  2. Acute systolic dysfunction Likely due to afib, though I think that we should also exclude ischemia.  Consider RHC/LHC once clinically stable.  Given ARF, this may have to be done in a subsequent setting.  No acute ST segment changes to require more urgent consideration. Dr Sallyanne Kuster has been managing today.  We will hold beta blocker therapy and continue dopamine for now.  I worry about risks of afib with dobutamine, however we could consider this if necessary. Advanced heart failure team to see tomorrow per Dr Sallyanne Kuster.  3. Cardiogenic shock The patient has multiorgan failure with acute  on chronic renal failure and shock liver.  I appreciate PCCM assistance.  I would advise that we keep her on vent until she is hemodynamically more stable.  4. Hyperthyroidism with thyrotoxicosis This is a significant issue and likely contributing to her clinical state.  Amiodarone is not helping but is currently required.  We may need to consider inpatient endocrine assistance depending on her clinical course.   Critical care time was exclusive of separate billable procedures and treating other patients.  Critial  care time was spent personally by me (independant of midlevel providers) on the following activities: development of treatment plan with the patients sister as well as nursing, discussions with Drs Maryjean Ka and Croitoru, evaluation of patients response to treatment, examining patient, obtaining history from sister,  reviewing treatments/ interventions, lab studies, radiographic studies, pulse ox, and re-evaluation of patients condition.     The patient is critically ill with multiple organ systems failure and requires high complexity decision making for assessment and support, frequent evaluation and titration of therapies, application of advanced monitoring technologies and extensive interpretation of databases.   Critical care was necessary to treat or prevent immintent or life-threatening deterioration.    Total CCT spent directly with the patient today is 45 minutes  Thompson Grayer MD, [facility] 02/25/2019 9:43 AM

## 2019-02-25 NOTE — Consult Note (Signed)
Advanced Heart Failure Team Consult Note   Primary Physician: Rutherford Guys, MD PCP-Cardiologist:  Fransico Him, MD  Reason for Consultation: CHF  HPI:    Judith [name] is seen today for evaluation of CHF at the request of Dr. Sallyanne Kuster.   68 y.o. with history of paroxysmal atrial fibrillation, DM, left BKA, and CAD.  Atrial fibrillation was first noted in 2019 s/p left BKA for osteomyelitis left foot. Successful DCCV in 1/20.  She was back in atrial fibrillation by 10/20.  She presented to the ER on 03/03/2019 with palpitations, chest pain, and dyspnea. She was in atrial fibrillation with RVR despite amiodarone use at home.  She has also been noted to have hyperthyroidism recently, likely due to amiodarone.  She has been on methimazole. She developed hypotension and was emergently cardioverted on 10/17 to NSR, amiodarone was continued. She was intubated due to CHF with markedly elevated CVP. She was started on dopamine.  She has maintained NSR.  Echo was done, showed EF 30-35% with inferoseptal severe hypokinesis, severe LAE, moderate RAE, moderate MR, Possible moderate aortic stenosis, mildly decreased RV systolic function.   Patient developed AKI with most recent creatinine 2.12 and markedly elevated transaminases likely due to shock liver. Co-ox this morning 64% on dopamine gtt. Lactate elevated, most recently 2.1.  She is currently on dopamine 3.5 with amiodarone 30 mg/hr.  INR is very high today in setting of liver failure, plan to start heparin tomorrow.  She is on hydrocortisone and methimazole for thyrotoxicosis.  TSH remains low and free T4 high.   Review of Systems: All systems reviewed and negative except as per HPI.   Home Medications Prior to Admission medications   Medication Sig Start Date End Date Taking? Authorizing Provider  albuterol (PROVENTIL HFA) 108 (90 Base) MCG/ACT inhaler Inhale 1-2 puffs into the lungs every 6 (six) hours as needed for wheezing or shortness of  breath. 05/01/18  Yes Angiulli, Lavon Paganini, PA-C  amiodarone (PACERONE) 200 MG tablet Take 1 tablet (200 mg total) by mouth daily. 06/08/18  Yes Sherran Needs, NP  atorvastatin (LIPITOR) 20 MG tablet Take 1 tablet (20 mg total) by mouth daily. 05/01/18  Yes Angiulli, Lavon Paganini, PA-C  buPROPion (WELLBUTRIN) 75 MG tablet Take 75 mg by mouth 2 (two) times daily.   Yes [provider]  cetirizine (ZYRTEC) 10 MG tablet Take 10 mg by mouth daily as needed for allergies.    Yes [provider]  diltiazem (CARTIA XT) 120 MG 24 hr capsule Take 1 capsule (120 mg total) by mouth daily. 02/12/19  Yes Bhagat, Bhavinkumar, PA  ELIQUIS 5 MG TABS tablet Take 1 tablet by mouth twice daily Patient taking differently: Take 5 mg by mouth 2 (two) times daily.  11/21/18  Yes Turner, Eber Hong, MD  famotidine (PEPCID) 20 MG tablet Take 1 tablet (20 mg total) by mouth 2 (two) times daily. Patient taking differently: Take 40 mg by mouth daily.  06/17/18  Yes Rutherford Guys, MD  ferrous sulfate 325 (65 FE) MG tablet Take 325 mg by mouth daily as needed (Low iron).   Yes [provider]  FLUoxetine (PROZAC) 20 MG capsule TAKE 1 CAPSULE AT BEDTIME Patient taking differently: Take 20 mg by mouth at bedtime.  01/03/19  Yes Rutherford Guys, MD  fluticasone (FLOVENT HFA) 44 MCG/ACT inhaler Inhale 2 puffs into the lungs 2 (two) times daily. Patient taking differently: Inhale 2 puffs into the lungs daily as  needed (Allergies).  01/22/19  Yes Rutherford Guys, MD  furosemide (LASIX) 40 MG tablet Take 1 tablet (40 mg total) by mouth daily. 01/18/19  Yes Rutherford Guys, MD  hydrALAZINE (APRESOLINE) 50 MG tablet Take 1 tablet (50 mg total) by mouth 3 (three) times daily. 10/13/18  Yes Isaiah Serge, NP  insulin degludec (TRESIBA FLEXTOUCH) 100 UNIT/ML SOPN FlexTouch Pen Inject 20 Units into the skin at bedtime.    Yes [provider]  ketoconazole (NIZORAL) 2 % cream Apply 1 application topically daily.  02/21/19  Yes Maximiano Coss, NP  loratadine (CLARITIN) 10 MG tablet Take 10 mg by mouth at bedtime.   Yes [provider]  magnesium oxide (MAG-OX) 400 MG tablet Take 400 mg by mouth at bedtime.    Yes [provider]  Melatonin 10 MG TABS Take 10 mg by mouth at bedtime as needed (Sleep).    Yes [provider]  metFORMIN (GLUCOPHAGE-XR) 500 MG 24 hr tablet Take 1,000 mg by mouth daily with breakfast.   Yes [provider]  methimazole (TAPAZOLE) 10 MG tablet TAKE 1 TABLET (10 MG TOTAL) BY MOUTH DAILY. Patient taking differently: Take 10 mg by mouth daily.  02/13/2019  Yes Renato Shin, MD  metoprolol succinate (TOPROL-XL) 25 MG 24 hr tablet Take 50 mg by mouth 2 (two) times daily.    Yes [provider]  mupirocin ointment (BACTROBAN) 2 % Apply 1 application topically 2 (two) times daily as needed (wound/sores).    Yes [provider]  nitroGLYCERIN (NITROSTAT) 0.4 MG SL tablet Place 0.4 mg under the tongue every 5 (five) minutes x 3 doses as needed for chest pain.    Yes [provider]  polyethylene glycol powder (GLYCOLAX/MIRALAX) 17 GM/SCOOP powder Take 17 g by mouth daily as needed (constipation.).    Yes [provider]  potassium chloride (K-DUR) 10 MEQ tablet Take 1 tablet (10 mEq total) by mouth 2 (two) times daily. Patient taking differently: Take 10 mEq by mouth every other day.  01/18/19  Yes Rutherford Guys, MD  triamcinolone (KENALOG) 0.1 % paste Use as directed 1 application in the mouth or throat 2 (two) times daily as needed (ulcer lesion.). 02/05/19  Yes Rutherford Guys, MD  TRUE METRIX BLOOD GLUCOSE TEST test strip TEST BLOOD SUGAR TWICE DAILY 09/16/18  Yes Renato Shin, MD    Past Medical History: 1. Paroxysmal atrial fibrillation/atrial flutter: First noted in 2019 after left BKA.  DCCV 1/20. Failed sotalol in the past.  2. CAD: DES to LCx/OM with known CTO RCA on 2016 cath.  3. CKD stage 3 4. Asthma  5. CPAP: Uses Bipap 6. H/o osteomyelitis of foot: S/p left BKA.  7. Type 2 diabetes 8. 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  9. CHF: Echo (1/20) with EF 60-65%, mild-moderate AS with mean gradient 17 mmHg.  - Echo (10/20): EF 30-35% with inferoseptal severe hypokinesis, severe LAE, moderate RAE, moderate MR, Possible moderate aortic stenosis, mildly decreased RV systolic function.  Family History: Family History  Problem Relation Age of Onset  . Cancer Mother 39       bronchial cancer  . Breast cancer Mother   . Lung cancer Mother   . Hypertension Father   . COPD Father   . Heart disease Father 66       CAD with cardiac stenting  . Heart attack Father   . Parkinson's disease Father   . Allergies Sister   .  Breast cancer Maternal Grandmother   . Emphysema Maternal Grandfather   . Leukemia Paternal Grandmother   . Emphysema Paternal Grandfather   . Thyroid disease Neg Hx   . Colon cancer Neg Hx   . Stomach cancer Neg Hx   . Rectal cancer Neg Hx   . Esophageal cancer Neg Hx     Social History: Social History   Socioeconomic History  . Marital status: Single    Spouse name: Not on file  . Number of children: 2  . Years of education: Masters  . Highest education level: Not on file  Occupational History  . Occupation: Landscape architect  Social Needs  . Financial resource strain: Somewhat hard  . Food insecurity    Worry: Sometimes true    Inability: Sometimes true  . Transportation needs    Medical: No    Non-medical: No  Tobacco Use  . Smoking status: Former Smoker    Packs/day: 0.00    Years: 41.00    Pack years: 0.00    Types: Cigarettes    Quit date: 03/05/2015    Years since quitting: 3.9  . Smokeless tobacco: Never Used  . Tobacco comment: 04/29/2015 "quit smoking cigarettes 02/27/2015"  Substance and Sexual Activity  . Alcohol use: No  . Drug use: No  . Sexual activity: Not Currently    Birth control/protection:  Post-menopausal, Surgical  Lifestyle  . Physical activity    Days per week: 0 days    Minutes per session: 0 min  . Stress: Very much  Relationships  . Social connections    Talks on phone: More than three times a week    Gets together: More than three times a week    Attends religious service: More than 4 times per year    Active member of club or organization: No    Attends meetings of clubs or organizations: Never    Relationship status: Never married  Other Topics Concern  . 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      Lives with sister in [place].      Tobacco: 1 ppd x 41 years - quit 2016      Alcohol: none      Drugs: none      Exercise:  Walking in neighborhood; physical job.   Right-handed.   2 cups caffeine daily.    Allergies:  Allergies  Allergen Reactions  . Contrast Media [Iodinated Diagnostic Agents] Hives and Other (See Comments)    Spoke to patient, Iodine allergy is really IV contrast allergy.   [name] [Insulin Aspart] Shortness Of Breath and Other (See Comments)    "breathing problems"  . Codeine Nausea And Vomiting and Other (See Comments)    HIGH DOSES-SEVERE VOMITING  . Iodine Other (See Comments)    MUST HAVE BENADRYL PRIOR TO PROCEDURE AND RIGHT BEFORE TREATMENT TO COUNTERACT REACTION-BLISTERING REACTION DERMATOLOGICAL  . Penicillins Itching, Rash and Other (See Comments)    Has patient had a PCN reaction causing immediate rash, facial/tongue/throat swelling, SOB or lightheadedness with hypotension: no Has patient had a PCN reaction causing severe rash involving mucus membranes or skin necrosis: No Has patient had a PCN reaction that required hospitalization No Has patient had a PCN  reaction occurring within the last  10 years: No If all of the above answers are "NO", then may proceed with Cephalosporin use.  CHEST SIZED RASH AND ITCHING   . Ace Inhibitors Cough  . Demerol [Meperidine] Nausea And Vomiting  . Dilaudid [Hydromorphone Hcl] Other (See Comments)    HEADACHE   . Neosporin [Neomycin-Bacitracin Zn-Polymyx] Itching, Rash and Other (See Comments)    MAKES REACTIONS WORSE WHEN USING AS PROPHYLACTIC  . Percocet [Oxycodone-Acetaminophen] Rash  . Tape Itching and Rash    Objective:    Vital Signs:   Temp:  [96.4 F (35.8 C)-98.2 F (36.8 C)] 98.1 F (36.7 C) (10/18 1134) Pulse Rate:  [61-78] 66 (10/18 1600) Resp:  [0-23] 20 (10/18 1600) BP: (89-134)/(48-85) 97/72 (10/18 1600) SpO2:  [96 %-100 %] 98 % (10/18 1600) Arterial Line BP: (87-133)/(47-79) 99/54 (10/18 1600) FiO2 (%):  [40 %-50 %] 40 % (10/18 1553) Weight:  [85.7 kg] 85.7 kg (10/18 0500) Last BM Date: 02/21/19  Weight change: Filed Weights   03/04/2019 1858 03/09/2019 0500 02/25/19 0500  Weight: 86.1 kg 81.2 kg 85.7 kg    Intake/Output:   Intake/Output Summary (Last 24 hours) at 02/25/2019 1619 Last data filed at 02/25/2019 1600 Gross per 24 hour  Intake 1811.4 ml  Output 985 ml  Net 826.4 ml      Physical Exam    General:  Intubated/sedated HEENT: normal Neck: supple. JVP 12+ cm. Carotids 2+ bilat; no bruits. No lymphadenopathy or thyromegaly appreciated. Cor: PMI nondisplaced. Regular rate & rhythm. No rubs, gallops. 2/6 SEM RUSB. Lungs: Decreased at bases.  Abdomen: soft, nontender, nondistended. No hepatosplenomegaly. No bruits or masses. Good bowel sounds. Extremities: no cyanosis, clubbing, rash, edema Neuro: alert & orientedx3, cranial nerves grossly intact. moves all 4 extremities w/o difficulty. Affect pleasant   Telemetry   NSR 60s (personally reviewed)  EKG    NSR, 1st degree AVB, old inferior MI, possible old anterior MI  Labs   Basic Metabolic Panel: Recent Labs  Lab 02/14/2019 1459  02/08/2019 1738 02/15/2019 0331 02/27/2019 1706 03/02/2019 1712 02/11/2019 1931 02/27/2019 2228 02/25/19 0250  NA 129*  --  128* 128* 129* 129* 131* 130*  K 4.2  --  4.7 5.0 5.1 4.8 4.6 4.3  CL 89*  --  91*  --  92*  --   --  89*  CO2 26  --  22  --  17*  --   --  26  GLUCOSE 85  --  130*  --  87  --   --  231*  BUN 31*  --  33*  --  39*  --   --  44*  CREATININE 1.37*  --  1.63*  --  2.29*  --   --  2.12*  CALCIUM 9.2  --  9.1  --  8.6*  --   --  8.5*  MG  --  1.9  --   --  2.0  --   --   --   PHOS  --   --   --   --  5.7*  --   --   --     Liver Function Tests: Recent Labs  Lab 03/01/2019 1459 03/03/2019 1024 02/25/19 0250  AST 93* 315* 3,843*  ALT 149* 234* 1,306*  ALKPHOS 153* 144* 151*  BILITOT 1.0 1.3* 1.7*  PROT 6.6 6.1* 6.1*  ALBUMIN 3.3* 3.0* 2.9*   No results for input(s): LIPASE, AMYLASE in the last 168 hours. No  results for input(s): AMMONIA in the last 168 hours.  CBC: Recent Labs  Lab 02/27/2019 1459 03/04/2019 0331 03/01/2019 1706 03/09/2019 1712 02/12/2019 1931 02/22/2019 2228 02/25/19 0250  WBC 10.8* 10.1  --  14.5*  --   --  14.3*  NEUTROABS 8.1*  --   --   --   --   --  13.1*  HGB 10.1* 9.9* 10.9* 10.1* 10.9* 11.2* 10.4*  HCT 32.3* 31.8* 32.0* 33.0* 32.0* 33.0* 33.1*  MCV 86.1 85.7  --  86.6  --   --  84.9  PLT 356 353  --  357  --   --  321    Cardiac Enzymes: No results for input(s): CKTOTAL, CKMB, CKMBINDEX, TROPONINI in the last 168 hours.  BNP: BNP (last 3 results) Recent Labs    02/11/2019 1459  BNP 1,119.0*    ProBNP (last 3 results) Recent Labs    10/13/18 1057 01/18/19 1445  PROBNP 2,908* 2,645*     CBG: Recent Labs  Lab 02/25/19 0402 02/25/19 0725 02/25/19 1132 02/25/19 1152 02/25/19 1540  GLUCAP 190* 218* 134* 148* 167*    Coagulation Studies: Recent Labs    02/21/2019 1644 02/13/2019 0331 02/11/2019 1712 02/25/19 0250  LABPROT 22.5* 32.2* 55.8* 48.1*  INR 2.0* 3.2* 6.5* 5.4*     Imaging   Dg Abd 1 View  Result Date:  02/15/2019 CLINICAL DATA:  Evaluate OG tube. EXAM: ABDOMEN - 1 VIEW COMPARISON:  None. FINDINGS: The OG tube terminates in the region of the gastric antrum. IMPRESSION: The OG tube terminates in the region of the gastric antrum. Electronically Signed   By: Dorise Bullion III M.D   On: 03/06/2019 18:35   US Abdomen Limited  Result Date: 02/25/2019 CLINICAL DATA:  Initial evaluation for abnormal LFTs. EXAM: ULTRASOUND ABDOMEN LIMITED RIGHT UPPER QUADRANT COMPARISON:  Prior ultrasound from 02/01/2019. FINDINGS: Gallbladder: Previously seen small stones not visualized on today's exam. Gallbladder wall thickened up to 7 mm, which could be related to incomplete distension. No free pericholecystic fluid. No sonographic Murphy sign elicited on exam. Common bile duct: Diameter: 5.2 mm Liver: No focal lesion identified. Within normal limits in parenchymal echogenicity. Portal vein is patent on color Doppler imaging with normal direction of blood flow towards the liver. Other: None. IMPRESSION: 1. Mild gallbladder wall thickening up to 7 mm, which could be related to incomplete distension. No other sonographic features for acute cholecystitis. 2. Previously seen cholelithiasis not visualized on today's exam. 3. No biliary dilatation or other acute finding. Electronically Signed   By: Jeannine Boga M.D.   On: 02/25/2019 07:32   Dg Chest Port 1 View  Result Date: 02/22/2019 CLINICAL DATA:  68 year old female with history of central line placement. EXAM: PORTABLE CHEST 1 VIEW COMPARISON:  Chest x-ray 03/06/2019. FINDINGS: An endotracheal tube is in place with tip 2.5 cm above the carina. There is a right-sided internal jugular central venous catheter with tip terminating in the right atrium. Small bilateral pleural effusions. Atelectasis and/or consolidation in the left lung base. No pneumothorax. No evidence of pulmonary edema. Heart size appears mildly enlarged. Upper mediastinal contours are within normal  limits. Aortic atherosclerosis. IMPRESSION: 1. Support apparatus, as above. 2. Atelectasis and/or consolidation in the left lower lobe with small bilateral pleural effusions. Electronically Signed   By: Vinnie Langton M.D.   On: 02/19/2019 18:37      Medications:     Current Medications: . atorvastatin  20 mg Per Tube Daily  . budesonide (  PULMICORT) nebulizer solution  0.25 mg Nebulization BID  . buPROPion  75 mg Per Tube BID  . chlorhexidine gluconate (MEDLINE KIT)  15 mL Mouth Rinse BID  . Chlorhexidine Gluconate Cloth  6 each Topical Daily  . famotidine  20 mg Per Tube Daily  . ferrous sulfate  325 mg Oral Daily  . FLUoxetine  20 mg Per Tube QHS  . hydrocortisone sod succinate (SOLU-CORTEF) inj  100 mg Intravenous Q8H  . insulin aspart  0-15 Units Subcutaneous Q4H  . insulin glargine  15 Units Subcutaneous QHS  . loratadine  10 mg Per Tube QHS  . magnesium oxide  400 mg Per Tube QHS  . mouth rinse  15 mL Mouth Rinse 10 times per day  . methimazole  10 mg Per Tube BID  . sodium chloride flush  10-40 mL Intracatheter Q12H  . Thrombi-Pad  1 each Topical Once     Infusions: . amiodarone 30 mg/hr (02/25/19 1600)  . DOPamine 3.5 mcg/kg/min (02/25/19 1600)  . fentaNYL infusion INTRAVENOUS 50 mcg/hr (02/25/19 1600)  . [START ON 02/26/2019] heparin    . midazolam 1 mg/hr (02/25/19 1600)       Assessment/Plan   1. Acute on chronic systolic CHF/cardiogenic shock: Echo this admission with new fall in EF, 30-35% with diffuse hypokinesis worse in the inferoseptal wall, severe LAE. Possible tachy-mediated CMP as she was in rapid atypical atrial flutter rate 150s at admission.  Currently on dopamine 2.5.  CVP 15, co-ox was adequate at 64% earlier today.  SBP currently in 90s.  - I will make transition from dopamine to norepinephrine and then wean as able. Recheck lactate.  - I will give Lasix 80 mg IV bid with elevated CVP.   - Need to maintain NSR, will have to continue amiodarone.   - Eventually will need workup for coronary disease as cause of cardiomyopathy (has known prior CAD), but would hold off until creatinine comes down.  2. Atypical atrial flutter/atrial fibrillation: Patient has had both atrial fibrillation and atypical flutter.  Admitted with atypical flutter/RVR in 150s.  She had emergent DCCV yesterday and remains in NSR.  She has severe LAE and moderate RAE.  She has been seen by Dr. Rayann Heman.  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 She is not a good ablation candidate given the size of the atria though could consider if she hold NSR for a month or two.  - If she has recurrence of atrial fibrillation/flutter with RVR, would consider AV nodal ablation/BiV pacing.  - Continue amiodarone gtt.  - INR high today, stopping apixaban for now.  Will start heparin gtt probably tomorrow.  3. Liver failure: Markedly elevated transaminases.  Suspect shock liver from hypotension/shock.   - Maintain MAP and CO, hopefully will improve over time.  - Stop statin for now.  4. CAD: Known CTO RCA, prior DES to LCx/OM in 2016.  Peak hs-TnI 548.  Most likely demand ischemia from shock.  - Holding statin with liver failure.  - As above, would consider coronary angiography once creatinine comes down.  5. Hyperthyroidism: Thyrotoxicosis likely helps drive the atrial fibrillation/flutter.  This may have been triggered by chronic amiodarone use.  Unfortunately, she does not have a lot of options right now and will need to continue amiodarone.  - She is on hydrocortisone dosed for thyrotoxicosis.  - Continue methimazole.  6. AKI: On CKD stage  3.  Suspect due to shock.  Hopefully will improve with supportive care.  7. Aortic stenosis: Probably moderate.   Length of Stay: 2  Loralie Champagne, MD  02/25/2019, 4:19 PM  Advanced Heart Failure Team Pager [phone] (M-F; 7a -  4p)  Please contact [facility] for night-coverage after hours (4p -7a ) and weekends on [email or web address]

## 2019-02-25 NOTE — Progress Notes (Signed)
Chart reviewed.  Patient not seen.  Patient remains intubated post cardioversion since yesterday.  PCCM involved in the care and will assume patient's care as primary attending physician.  [facility] hospitalist will sign off.  Please let us know when patient is ready to be transferred to floor.

## 2019-02-25 NOTE — Progress Notes (Signed)
Pt still asynchronous with vent despite sedation increases and boluses. CCM NP at bedside to evaluate pt.

## 2019-02-25 NOTE — Progress Notes (Signed)
Progress Note  Patient Name: Judith [name] Date of Encounter: 02/25/2019  Primary Cardiologist: Fransico Him, MD ; Thompson Grayer, MD (EP)  Subjective   Rapid deterioration yesterday afternoon to cardiogenic shock.  Intubated.  On pressors.  Emergency cardioversion successful.  Maintaining sinus rhythm overnight. Requiring relatively little pressor support and ventilator support (dopamine 3.5 mcg/kilogram/minute; FiO2 0.4). Good urine output but worsening creatinine and lab evidence of shock liver (INR 5.4, AST greater than 3000). Discussed her situation with her sister Judith [name] at the bedside.   Inpatient Medications    Scheduled Meds: . apixaban  5 mg Per Tube BID  . atorvastatin  20 mg Per Tube Daily  . budesonide (PULMICORT) nebulizer solution  0.25 mg Nebulization BID  . buPROPion  75 mg Per Tube BID  . chlorhexidine gluconate (MEDLINE KIT)  15 mL Mouth Rinse BID  . Chlorhexidine Gluconate Cloth  6 each Topical Daily  . famotidine  20 mg Per Tube Daily  . ferrous sulfate  325 mg Oral Daily  . FLUoxetine  20 mg Per Tube QHS  . insulin aspart  0-15 Units Subcutaneous TID WC  . insulin aspart  0-5 Units Subcutaneous QHS  . insulin glargine  15 Units Subcutaneous QHS  . loratadine  10 mg Per Tube QHS  . magnesium oxide  400 mg Per Tube QHS  . mouth rinse  15 mL Mouth Rinse 10 times per day  . methimazole  10 mg Per Tube BID  . metoprolol succinate  75 mg Oral BID  . sodium chloride flush  10-40 mL Intracatheter Q12H  . Thrombi-Pad  1 each Topical Once   Continuous Infusions: . amiodarone 30 mg/hr (02/25/19 0800)  . DOPamine 3.5 mcg/kg/min (02/25/19 0829)  . fentaNYL infusion INTRAVENOUS 50 mcg/hr (02/25/19 0800)  . midazolam 1 mg/hr (02/25/19 0800)  . phenylephrine (NEO-SYNEPHRINE) Adult infusion Stopped (03/08/2019 1854)  . sodium bicarbonate 150 mEq in dextrose 5% 1000 mL 50 mL/hr at 02/25/19 0800   PRN Meds: acetaminophen, albuterol, bisacodyl, docusate, fentaNYL,  fentaNYL (SUBLIMAZE) injection, Melatonin, midazolam, midazolam, nitroGLYCERIN, ondansetron (ZOFRAN) IV, polyethylene glycol, sodium chloride flush, triamcinolone   Vital Signs    Vitals:   02/25/19 0700 02/25/19 0727 02/25/19 0800 02/25/19 0826  BP: 100/66 100/66 94/67 (!) 89/48  [name]: 65 78 64 67  Resp: (!) 0 '20 13 20  ' Temp:  98.2 F (36.8 C)    TempSrc:  Oral    SpO2: 98% 99% 98% 100%  Weight:      Height:        Intake/Output Summary (Last 24 hours) at 02/25/2019 0843 Last data filed at 02/25/2019 0800 Gross per 24 hour  Intake 1546.79 ml  Output 900 ml  Net 646.79 ml   Last 3 Weights 02/25/2019 02/17/2019 02/22/2019  Weight (lbs) 188 lb 15 oz 179 lb 0.2 oz 189 lb 13.1 oz  Weight (kg) 85.7 kg 81.2 kg 86.1 kg      Telemetry    Sinus rhythm- Personally Reviewed  ECG    Sinus rhythm first-degree AV block, old inferior infarct, poor R wave progression, no acute ischemic repolarization abnormalities - Personally Reviewed  Physical Exam  Intubated, sedated, responds to voice and has purposeful movements. GEN: No acute distress.  Fingers are warm, tips of toes on left foot are little cool Neck:  8-10 cm JVD Cardiac: RRR, no murmurs, rubs, or gallops.  Respiratory: Clear to auscultation bilaterally. GI: Soft, nontender, non-distended  MS: No edema; right BKA Neuro:  Nonfocal  Psych: N/A  Labs    High Sensitivity Troponin:   Recent Labs  Lab 02/19/2019 1459 03/06/2019 1644 03/01/2019 1738 02/16/2019 1954  TROPONINIHS 375* 197* 476* 548*      Chemistry Recent Labs  Lab 02/09/2019 1459 02/10/2019 0331 02/08/2019 1024  02/26/2019 1712 03/01/2019 1931 02/22/2019 2228 02/25/19 0250  NA 129* 128*  --    < > 129* 129* 131* 130*  K 4.2 4.7  --    < > 5.1 4.8 4.6 4.3  CL 89* 91*  --   --  92*  --   --  89*  CO2 26 22  --   --  17*  --   --  26  GLUCOSE 85 130*  --   --  87  --   --  231*  BUN 31* 33*  --   --  39*  --   --  44*  CREATININE 1.37* 1.63*  --   --  2.29*  --    --  2.12*  CALCIUM 9.2 9.1  --   --  8.6*  --   --  8.5*  PROT 6.6  --  6.1*  --   --   --   --  6.1*  ALBUMIN 3.3*  --  3.0*  --   --   --   --  2.9*  AST 93*  --  315*  --   --   --   --  3,843*  ALT 149*  --  234*  --   --   --   --  1,306*  ALKPHOS 153*  --  144*  --   --   --   --  151*  BILITOT 1.0  --  1.3*  --   --   --   --  1.7*  GFRNONAA 40* 32*  --   --  22*  --   --  24*  GFRAA 46* 38*  --   --  25*  --   --  27*  ANIONGAP 14 15  --   --  20*  --   --  15   < > = values in this interval not displayed.     Hematology Recent Labs  Lab 02/20/2019 0331  02/13/2019 1712 02/09/2019 1931 02/21/2019 2228 02/25/19 0250  WBC 10.1  --  14.5*  --   --  14.3*  RBC 3.71*  --  3.81*  --   --  3.90  HGB 9.9*   < > 10.1* 10.9* 11.2* 10.4*  HCT 31.8*   < > 33.0* 32.0* 33.0* 33.1*  MCV 85.7  --  86.6  --   --  84.9  MCH 26.7  --  26.5  --   --  26.7  MCHC 31.1  --  30.6  --   --  31.4  RDW 15.9*  --  16.2*  --   --  16.1*  PLT 353  --  357  --   --  321   < > = values in this interval not displayed.    BNP Recent Labs  Lab 03/06/2019 1459  BNP 1,119.0*     DDimer No results for input(s): DDIMER in the last 168 hours.   Radiology    Dg Abd 1 View  Result Date: 03/04/2019 CLINICAL DATA:  Evaluate OG tube. EXAM: ABDOMEN - 1 VIEW COMPARISON:  None. FINDINGS: The OG tube terminates in the region of the gastric antrum. IMPRESSION: The  OG tube terminates in the region of the gastric antrum. Electronically Signed   By: Dorise Bullion III M.D   On: 02/22/2019 18:35   US Abdomen Limited  Result Date: 02/25/2019 CLINICAL DATA:  Initial evaluation for abnormal LFTs. EXAM: ULTRASOUND ABDOMEN LIMITED RIGHT UPPER QUADRANT COMPARISON:  Prior ultrasound from 02/01/2019. FINDINGS: Gallbladder: Previously seen small stones not visualized on today's exam. Gallbladder wall thickened up to 7 mm, which could be related to incomplete distension. No free pericholecystic fluid. No sonographic Murphy sign  elicited on exam. Common bile duct: Diameter: 5.2 mm Liver: No focal lesion identified. Within normal limits in parenchymal echogenicity. Portal vein is patent on color Doppler imaging with normal direction of blood flow towards the liver. Other: None. IMPRESSION: 1. Mild gallbladder wall thickening up to 7 mm, which could be related to incomplete distension. No other sonographic features for acute cholecystitis. 2. Previously seen cholelithiasis not visualized on today's exam. 3. No biliary dilatation or other acute finding. Electronically Signed   By: Jeannine Boga M.D.   On: 02/25/2019 07:32   Dg Chest Port 1 View  Result Date: 02/20/2019 CLINICAL DATA:  68 year old female with history of central line placement. EXAM: PORTABLE CHEST 1 VIEW COMPARISON:  Chest x-ray 02/12/2019. FINDINGS: An endotracheal tube is in place with tip 2.5 cm above the carina. There is a right-sided internal jugular central venous catheter with tip terminating in the right atrium. Small bilateral pleural effusions. Atelectasis and/or consolidation in the left lung base. No pneumothorax. No evidence of pulmonary edema. Heart size appears mildly enlarged. Upper mediastinal contours are within normal limits. Aortic atherosclerosis. IMPRESSION: 1. Support apparatus, as above. 2. Atelectasis and/or consolidation in the left lower lobe with small bilateral pleural effusions. Electronically Signed   By: Vinnie Langton M.D.   On: 03/01/2019 18:37   Dg Chest Port 1 View  Result Date: 02/13/2019 CLINICAL DATA:  Post intubation EXAM: PORTABLE CHEST 1 VIEW COMPARISON:  February 23, 2019 FINDINGS: The ETT terminates in good position. Stable cardiomegaly. The hila and mediastinum are unchanged. No pneumothorax. The lungs are clear. IMPRESSION: The ETT is in good position. Electronically Signed   By: Dorise Bullion III M.D   On: 02/08/2019 16:12   Dg Chest Portable 1 View  Result Date: 03/06/2019 CLINICAL DATA:  AFib.  Chest  pain.  Shortness of breath. EXAM: PORTABLE CHEST 1 VIEW COMPARISON:  Chest x-ray 01/18/2019. FINDINGS: Mediastinum and hilar structures normal. Cardiomegaly with normal pulmonary vascularity. No focal infiltrate. No pleural effusion noted on today's exam. Previously identified right pleural effusion has cleared. IMPRESSION: 1. Cardiomegaly. No pulmonary venous congestion. No pleural effusion noted on today's exam. Previously identified right pleural effusion has cleared. 2.  No acute pulmonary disease. Electronically Signed   By: Marcello Moores  Register   On: 02/20/2019 15:19    Cardiac Studies   Echo 02/26/2019   1. Left ventricular ejection fraction, by visual estimation, is 30 to 35%. The left ventricle has moderate to severely decreased function. Normal left ventricular size. There is mildly increased left ventricular hypertrophy.  2. Wall motion anlysis was limited (parasternal and apical-4-chamber views only), but there is global LV hypokinesis with severe inferosptal hypokinesis.  3. Left ventricular diastolic function could not be evaluated due to atrial fibrillation.  4. Left atrial size was severely dilated.  5. Right atrial size was moderately dilated.  6. Moderate to severe mitral annular calcification.  7. The mitral valve is degenerative. Moderate mitral valve regurgitation.  8. The tricuspid  valve is grossly normal. Tricuspid valve regurgitation mild-moderate.  9. The aortic valve is tricuspid. Mild to moderate aortic valve sclerosis/calcification. There is no aortic insufficiency. 10. Gradients underestimate the degree of aortic stenosis due to low stroke volume. Overall impression is of moderate aortic stenosis. 11. The right ventricle has mildly reduced global systolic function.The right ventricular size is normal. No increase in right ventricular wall thickness. 12. Compared to January 2020, left ventricular systolic function has deteriorated globally, but there also appears to be new  inferoseptal severe hypokinesis, disproportionate to the remainder of the LV. Mitral regurgitation has worsened and aortic valve  gradients are lower due to poor cardiac output.  Myoview 10/19/2018  Nuclear stress EF: 57%.  There was no ST segment deviation noted during stress.  This is a low risk study.  The left ventricular ejection fraction is normal (55-65%).   Low risk stress test with no evidence of ischemia or infarction.    Patient Profile     68 y.o. female with cardiogenic shock and newly depressed LVEF in the setting of atrial fibrillation with rapid ventricular response, thyrotoxicosis, CAD (chronic occlusion of RCA, history of stents to left circumflex/OM 2016), PAD status post right BKA, COPD, recent high-dose oral steroid therapy for rash  Assessment & Plan    1.  Cardogenic shock: Improving, only requiring a low-dose of dopamine at this time.  Would hold beta-blockers until she is completely off pressors.  Latest co-ox 64% lactic acidosis improving, good urine output. 2.  Acute respiratory failure: Intubated/mechanically ventilated but requiring relatively little ventilator support and only 40% FiO2.  Management per PCCM. 3. 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  4. CAD: Angina has not been a prominent complaint, different from her previous presentations.  Allowing for known RCA CTO, the pattern of left ventricular dysfunction is global.  Cardiac enzyme increase was relatively small.  Consider right and left heart catheterization if shock fails to improve, but at this point there is no evidence of acute myocardial infarction as the driving cause of her events and kidney failure makes the use of contrast media undesirable. 5. AFib: Remarkably, she is maintaining sinus  rhythm despite her acute illness, following emergency cardioversion yesterday.  Continue intravenous amiodarone.  Hold apixaban today due to increased INR/shock liver.  Start intravenous heparin tomorrow morning. 6.  Thyrotoxicosis: Definitely contributing to her refractory atrial fibrillation and tachycardia, on recently increased dose of methimazole.  Will provide high-dose hydrocortisone at least for the next 24 hours.  Switch to oral steroids when able.  Resume beta-blockers gradually when she is off pressors.  Family reports that she had radioactive ablation of the thyroid in the past, but surprisingly she is still hyperthyroid.  Amiodarone therapy probably contributing to thyroid dysfunction. 7.  Shock liver: Rapid and severe increase in transaminases consistent with hypoperfusion.  Hypoprothrombinemia.  Slight increase in bilirubin.  Monitor LFTs daily. 8.  Acute on chronic kidney disease: Increasing creatinine from baseline was noted even before she lapsed into cardiogenic shock and thankfully her creatinine has been stable over the last several hours.  Good urine output.  Hopefully we will avoid acute tubular necrosis.  Avoid all other nephrotoxic agents.     For questions or updates, please contact [place] Please consult [email or web address] for contact info under    CRITICAL CARE Performed by: Dani Gobble Macaila Tahir  Total critical care time: 50 minutes  Critical care time was exclusive of separately billable procedures and treating other patients.  Critical care was necessary to treat or prevent imminent or life-threatening deterioration.  Critical care was time spent personally by me on the following activities: development of treatment plan with patient and/or surrogate as well as nursing, discussions with consultants, evaluation of patient's response to treatment, examination of patient, obtaining history from patient or surrogate, ordering and performing treatments and interventions,  ordering and review of laboratory studies, ordering and review of radiographic studies, [name] oximetry and re-evaluation of patient's condition.  Discussed with Dr. McLean/heart failure team, Dr. Allred/electrophysiology.    Signed, Sanda Klein, MD  02/25/2019, 8:43 AM

## 2019-02-25 NOTE — Progress Notes (Signed)
Pt alarm going off for peak pressure. Pt seems to be holding breath and then will take in large breath and fighting against the vent. I have suctioned pt and bagged pt with little to no return for secretions. RN at bedside and going up on sedation.

## 2019-02-26 ENCOUNTER — Ambulatory Visit (HOSPITAL_COMMUNITY): Payer: Medicare PPO | Admitting: Nurse Practitioner

## 2019-02-26 ENCOUNTER — Encounter (HOSPITAL_COMMUNITY): Payer: Self-pay | Admitting: Internal Medicine

## 2019-02-26 DIAGNOSIS — R570 Cardiogenic shock: Secondary | ICD-10-CM

## 2019-02-26 DIAGNOSIS — J9601 Acute respiratory failure with hypoxia: Secondary | ICD-10-CM

## 2019-02-26 DIAGNOSIS — N179 Acute kidney failure, unspecified: Secondary | ICD-10-CM

## 2019-02-26 LAB — COMPREHENSIVE METABOLIC PANEL
ALT: 1269 U/L — ABNORMAL HIGH (ref 0–44)
AST: 1969 U/L — ABNORMAL HIGH (ref 15–41)
Albumin: 2.6 g/dL — ABNORMAL LOW (ref 3.5–5.0)
Alkaline Phosphatase: 142 U/L — ABNORMAL HIGH (ref 38–126)
Anion gap: 14 (ref 5–15)
BUN: 61 mg/dL — ABNORMAL HIGH (ref 8–23)
CO2: 27 mmol/L (ref 22–32)
Calcium: 8.5 mg/dL — ABNORMAL LOW (ref 8.9–10.3)
Chloride: 91 mmol/L — ABNORMAL LOW (ref 98–111)
Creatinine, Ser: 2.49 mg/dL — ABNORMAL HIGH (ref 0.44–1.00)
GFR calc Af Amer: 23 mL/min — ABNORMAL LOW (ref >60)
GFR calc non Af Amer: 19 mL/min — ABNORMAL LOW (ref >60)
Glucose, Bld: 137 mg/dL — ABNORMAL HIGH (ref 70–99)
Potassium: 4.8 mmol/L (ref 3.5–5.1)
Sodium: 132 mmol/L — ABNORMAL LOW (ref 135–145)
Total Bilirubin: 1.4 mg/dL — ABNORMAL HIGH (ref 0.3–1.2)
Total Protein: 5.9 g/dL — ABNORMAL LOW (ref 6.5–8.1)

## 2019-02-26 LAB — CBC
HCT: 33.5 % — ABNORMAL LOW (ref 36.0–46.0)
Hemoglobin: 10.5 g/dL — ABNORMAL LOW (ref 12.0–15.0)
MCH: 26.9 pg (ref 26.0–34.0)
MCHC: 31.3 g/dL (ref 30.0–36.0)
MCV: 85.7 fL (ref 80.0–100.0)
Platelets: 387 10*3/uL (ref 150–400)
RBC: 3.91 MIL/uL (ref 3.87–5.11)
RDW: 16.6 % — ABNORMAL HIGH (ref 11.5–15.5)
WBC: 14.7 10*3/uL — ABNORMAL HIGH (ref 4.0–10.5)
nRBC: 0.3 % — ABNORMAL HIGH (ref 0.0–0.2)

## 2019-02-26 LAB — GLUCOSE, CAPILLARY
Glucose-Capillary: 133 mg/dL — ABNORMAL HIGH (ref 70–99)
Glucose-Capillary: 133 mg/dL — ABNORMAL HIGH (ref 70–99)
Glucose-Capillary: 134 mg/dL — ABNORMAL HIGH (ref 70–99)
Glucose-Capillary: 147 mg/dL — ABNORMAL HIGH (ref 70–99)
Glucose-Capillary: 150 mg/dL — ABNORMAL HIGH (ref 70–99)
Glucose-Capillary: 199 mg/dL — ABNORMAL HIGH (ref 70–99)
Glucose-Capillary: 238 mg/dL — ABNORMAL HIGH (ref 70–99)

## 2019-02-26 LAB — COOXEMETRY PANEL
Carboxyhemoglobin: 1.1 % (ref 0.5–1.5)
Carboxyhemoglobin: 1.1 % (ref 0.5–1.5)
Carboxyhemoglobin: 1.2 % (ref 0.5–1.5)
Methemoglobin: 0.8 % (ref 0.0–1.5)
Methemoglobin: 0.6 % (ref 0.0–1.5)
Methemoglobin: 0.8 % (ref 0.0–1.5)
O2 Saturation: 53.9 %
O2 Saturation: 58.9 %
O2 Saturation: 62.9 %
Total hemoglobin: 10.5 g/dL — ABNORMAL LOW (ref 12.0–16.0)
Total hemoglobin: 10.4 g/dL — ABNORMAL LOW (ref 12.0–16.0)
Total hemoglobin: 10.3 g/dL — ABNORMAL LOW (ref 12.0–16.0)

## 2019-02-26 LAB — LACTIC ACID, PLASMA: Lactic Acid, Venous: 1.2 mmol/L (ref 0.5–1.9)

## 2019-02-26 LAB — APTT: aPTT: 58 seconds — ABNORMAL HIGH (ref 24–36)

## 2019-02-26 MED ORDER — FERROUS SULFATE 300 (60 FE) MG/5ML PO SYRP
300.0000 mg | ORAL_SOLUTION | Freq: Every day | ORAL | Status: DC
  Administered 2019-02-26 – 2019-03-05 (×8): 300 mg
  Filled 2019-02-26 (×10): qty 5

## 2019-02-26 MED ORDER — FUROSEMIDE 10 MG/ML IJ SOLN
80.0000 mg | Freq: Once | INTRAMUSCULAR | Status: AC
  Administered 2019-02-26: 80 mg via INTRAVENOUS
  Filled 2019-02-26: qty 8

## 2019-02-26 MED ORDER — CHLORHEXIDINE GLUCONATE CLOTH 2 % EX PADS
6.0000 | MEDICATED_PAD | Freq: Every day | CUTANEOUS | Status: DC
  Administered 2019-02-27 – 2019-03-06 (×9): 6 via TOPICAL

## 2019-02-26 MED ORDER — FUROSEMIDE 10 MG/ML IJ SOLN: 80.0000 mg | Freq: Every day | INTRAMUSCULAR | Status: DC

## 2019-02-26 MED ORDER — VITAL HIGH PROTEIN PO LIQD: 1000.0000 mL | ORAL | Status: DC

## 2019-02-26 MED ORDER — VITAL HIGH PROTEIN PO LIQD
1000.0000 mL | ORAL | Status: DC
  Administered 2019-02-26 – 2019-02-28 (×2): 1000 mL

## 2019-02-26 MED ORDER — VITAL HIGH PROTEIN PO LIQD
1000.0000 mL | ORAL | Status: DC
  Administered 2019-02-26: 1000 mL

## 2019-02-26 NOTE — Progress Notes (Signed)
  Came to bedside for f/u assessment. Remains intubated and sedated. Remains on dobutamine 57mcg/kg/min + NE 5 mcg/min. MAPs in the 80s. Repeat Co-ox improved at 62%. NSR on tele. No recurrent atrial arrhythmias. No ventricular arrhymias w/ dobutamine. Continues on IV amiodarone load. Discussed w/ RN at bedside. No notable events since morning rounds. Continue current plan of care. No further medication changes at this time.   Lyda Jester, PA-C 02/26/2019

## 2019-02-26 NOTE — Progress Notes (Addendum)
NAME:  Judith [name], MRN:  [id number], DOB:  1950-08-14, LOS: 3 ADMISSION DATE:  02/17/2019, CONSULTATION DATE:  03/10/2019 REFERRING MD:  Dr. Rayann Heman, CHIEF COMPLAINT:  Resp. Fail.   Brief History   68 y/o with multiple comorbidities, including paroxysmal afib. Admitted in afib w/ RVR with what appears to be developing CHF. Admitted 10/16, became more SOB 10/17, still in AFib despite amiodarone and dilt. Taken for cardioversion but became more hypotensive,  requiring pressor support, intubated. Called for vent management; assistance with pressors    Past Medical History  1) paroxysmal afib 2) CHF 3) COPD(?) 4) Hyperthyroidism 5) IDDM 6) CKD stage III  Significant Hospital Events   10/17 cardioversion 10/17 intubation          Consults:  Cardiology PCCM  Procedures:  10/17--aline 10/17 ETT >> 10/17 RIJ central line >> 10/17 cardioversion  Significant Diagnostic Tests:  10/18: ULTRASOUND ABDOMEN LIMITED RIGHT UPPER QUADRANT 1. Mild gallbladder wall thickening up to 7 mm, which could be related to incomplete distension. No other sonographic features for acute cholecystitis. 2. Previously seen cholelithiasis not visualized on today's exam. 3. No biliary dilatation or other acute finding.   10/17 TTE: EF  30 to 35%.  global LV hypokinesis with severe inferosptal hypokinesis.  mod AS, mod MR    Micro Data:  COVID neg HIV neg MRSA neg Antimicrobials:  none   Interim history/subjective:  Severe vent asynchrony overnight, requiring 1 dose of paralytic Dobutamine started and titrated based on coox Remains in normal sinus rhythm on amiodarone drip. Remains critically ill, intubated, deeply sedated on Versed and fentanyl drips   Objective   Blood pressure 116/71, pulse 62, temperature 98.5 F (36.9 C), temperature source Oral, resp. rate 12, height 5\' 4"  (1.626 m), weight 85.7 kg, SpO2 96 %. CVP:  [6 mmHg-37 mmHg] 14 mmHg  Vent Mode: PRVC FiO2 (%):  [40 %] 40 %  Set Rate:  [12 bmp-20 bmp] 12 bmp Vt Set:  [430 mL-540 mL] 540 mL PEEP:  [5 cmH20-8 cmH20] 5 cmH20 Plateau Pressure:  [15 [id number] cmH20] 18 cmH20   Intake/Output Summary (Last 24 hours) at 02/26/2019 1024 Last data filed at 02/26/2019 1000 Gross per 24 hour  Intake 1087.15 ml  Output 660 ml  Net 427.15 ml   Filed Weights   03/09/2019 0500 02/25/19 0500 02/26/19 0500  Weight: 81.2 kg 85.7 kg 85.7 kg    Examination: General: NAD, sedated, elderly woman HENT: AT/[place]; ETT midline no periorbital edema Lungs: Decreased breath sounds bilateral, no rhonchi Cardiovascular: RRR, III/VI SEM at 4th, LSB Abdomen: SND Extremities: no edema or lesion Neuro: sedated on Versed and fentanyl does not follow commands GU: foley cath in place  Chest x-ray 10/19 personally reviewed shows high ET tube at thoracic inlet, small bilateral effusions and left?  Retrocardiac atelectasis versus infiltrate  Resolved Hospital Problem list   Afib--cardioverted, in sinus, amiodarone still loading  Assessment & Plan:  Multiple comorbidities including Afib, cardioverted with subsequent cardiogenic shock and acute respiratory failure   Acute respiratory failure Severe vent asynchrony requiring paralytic 10/19 a.m.  -Spontaneous breathing trials once paralytic worn off -Use Versed as needed and use fentanyl drip for sedation, goal RASS -1 -Does not have diagnosis of COPD was given some low-dose prednisone as outpatient, does not need stress dose steroids, will discontinue Solu-Cortef since no bronchospasm at present  Cardiogenic shock -Taper Levophed to off -Dobutamine being titrated based on cooximetry  Atrial fibrillation with RVR. Continue amiodarone drip +  IV heparin  AKI -likely cardiorenal plus diuretic induced, underlying CKD stage III, baseline creatinine 1.6 -Would hold Lasix for 24 hours -Acidosis has resolved and off bicarb drip  Mild hyponatremia-diuretic induced  Shock liver-follow  LFTs intermittently Start tube feeds  IDDM: POC glucose monitoring, SSI   Best practice:  Diet: NPO Pain/Anxiety/Delirium protocol (if indicated): Fentanyl drip, intermittent Versed VAP protocol (if indicated): in place DVT prophylaxis: heparin, SCDs GI prophylaxis: per protocol Glucose control: SSI Mobility: bed Code Status: full Family Communication: daughter 10/18 Disposition: ICU  The patient is critically ill with multiple organ systems failure and requires high complexity decision making for assessment and support, frequent evaluation and titration of therapies, application of advanced monitoring technologies and extensive interpretation of multiple databases. Critical Care Time devoted to patient care services described in this note independent of APP/resident  time is 35 minutes.   Kara Mead MD. Sh[place] Flood. [facility]  If no response to pager , please call [phone]   02/26/2019

## 2019-02-26 NOTE — Progress Notes (Signed)
Patient ID: Judith [name], female   DOB: February 04, 1951, 68 y.o.   MRN: [id number]     Advanced Heart Failure Rounding Note  PCP-Cardiologist: Fransico Him, MD   Subjective:    Patient had a difficult night on vent, asynchronous breathing and ended up getting 1 dose of paralytic.  This morning, she is sedated on Versed gtt, stable.    Co-ox lower overnight in 40s, started on dobutamine gtt 2.5 with co-ox higher at 54%.  CVP 15.  Poor UOP with rising creatinine to 2.49.  She has remained in NSR on amiodarone gtt.     Objective:   Weight Range: 85.7 kg Body mass index is 32.43 kg/m.   Vital Signs:   Temp:  [97.5 F (36.4 C)-99.1 F (37.3 C)] 98.5 F (36.9 C) (10/19 0730) Pulse Rate:  [57-68] 61 (10/19 0700) Resp:  [6-23] 12 (10/19 0700) BP: (89-132)/(48-91) 119/77 (10/19 0716) SpO2:  [95 %-100 %] 99 % (10/19 0716) Arterial Line BP: (87-138)/(47-80) 122/63 (10/19 0700) FiO2 (%):  [40 %] 40 % (10/19 0716) Weight:  [85.7 kg] 85.7 kg (10/19 0500) Last BM Date: 02/21/19  Weight change: Filed Weights   02/14/2019 0500 02/25/19 0500 02/26/19 0500  Weight: 81.2 kg 85.7 kg 85.7 kg    Intake/Output:   Intake/Output Summary (Last 24 hours) at 02/26/2019 0737 Last data filed at 02/26/2019 0640 Gross per 24 hour  Intake 1038.69 ml  Output 580 ml  Net 458.69 ml      Physical Exam    General:  Sedated on vent.  HEENT: Normal Neck: Supple. JVP 12-14 cm. Carotids 2+ bilat; no bruits. No lymphadenopathy or thyromegaly appreciated. Cor: PMI nondisplaced. Regular rate & rhythm. No rubs, gallops. 2/6 SEM RUSB.  Lungs: Decreased at bases.  Abdomen: Soft, nontender, nondistended. No hepatosplenomegaly. No bruits or masses. Good bowel sounds. Extremities: No cyanosis, clubbing, rash, edema. S/p right BKA.  Neuro: Sedated on vent.    Telemetry   NSR 60s (personally reviewed).   Labs    CBC Recent Labs    03/06/2019 1459  02/25/19 0250 02/26/19 0308  WBC 10.8*   < > 14.3* 14.7*   NEUTROABS 8.1*  --  13.1*  --   HGB 10.1*   < > 10.4* 10.5*  HCT 32.3*   < > 33.1* 33.5*  MCV 86.1   < > 84.9 85.7  PLT 356   < > 321 387   < > = values in this interval not displayed.   Basic Metabolic Panel Recent Labs    02/15/2019 1738  02/17/2019 1712  02/25/19 1735 02/26/19 0308  NA  --    < > 129*   < > 130* 132*  K  --    < > 5.1   < > 4.3 4.8  CL  --    < > 92*   < > 90* 91*  CO2  --    < > 17*   < > 27 27  GLUCOSE  --    < > 87   < > 155* 137*  BUN  --    < > 39*   < > 53* 61*  CREATININE  --    < > 2.29*   < > 2.04* 2.49*  CALCIUM  --    < > 8.6*   < > 8.7* 8.5*  MG 1.9  --  2.0  --   --   --   PHOS  --   --  5.7*  --   --   --    < > =  values in this interval not displayed.   Liver Function Tests Recent Labs    02/25/19 0250 02/26/19 0308  AST 3,843* 1,969*  ALT 1,306* 1,269*  ALKPHOS 151* 142*  BILITOT 1.7* 1.4*  PROT 6.1* 5.9*  ALBUMIN 2.9* 2.6*   No results for input(s): LIPASE, AMYLASE in the last 72 hours. Cardiac Enzymes No results for input(s): CKTOTAL, CKMB, CKMBINDEX, TROPONINI in the last 72 hours.  BNP: BNP (last 3 results) Recent Labs    03/04/2019 1459  BNP 1,119.0*    ProBNP (last 3 results) Recent Labs    10/13/18 1057 01/18/19 1445  PROBNP 2,908* 2,645*     D-Dimer No results for input(s): DDIMER in the last 72 hours. Hemoglobin A1C Recent Labs    03/06/2019 1954  HGBA1C 8.1*   Fasting Lipid Panel Recent Labs    03/04/2019 0331  CHOL 67  HDL 17*  LDLCALC 35  TRIG 76  CHOLHDL 3.9   Thyroid Function Tests Recent Labs    02/11/2019 1459  TSH 0.098*    Other results:   Imaging    Dg Chest Port 1 View  Result Date: 02/26/2019 CLINICAL DATA:  Respiratory abnormalities. EXAM: PORTABLE CHEST 1 VIEW COMPARISON:  Radiograph yesterday FINDINGS: Endotracheal tube tip at the thoracic inlet approximately 6.5 cm from the carina. Tip and side port of the enteric tube below the diaphragm in the stomach. Right internal  jugular central venous catheter tip at the atrial caval junction. Unchanged cardiomegaly. Lower lung volumes from prior exam. Retrocardiac opacity with small suspected pleural effusions appears similar allowing for lower lung volumes. No visualized pneumothorax. IMPRESSION: 1. Endotracheal tube tip at the thoracic inlet approximately 6.5 cm from the carina. Enteric tube in place. 2. Unchanged retrocardiac opacity with small suspected pleural effusions since yesterday. 3. Unchanged cardiomegaly. Electronically Signed   By: Keith Rake M.D.   On: 02/26/2019 00:06      Medications:     Scheduled Medications: . atorvastatin  20 mg Per Tube Daily  . budesonide (PULMICORT) nebulizer solution  0.25 mg Nebulization BID  . buPROPion  75 mg Per Tube BID  . chlorhexidine gluconate (MEDLINE KIT)  15 mL Mouth Rinse BID  . Chlorhexidine Gluconate Cloth  6 each Topical Daily  . famotidine  20 mg Per Tube Daily  . ferrous sulfate  300 mg Per Tube Daily  . FLUoxetine  20 mg Per Tube QHS  . furosemide  80 mg Intravenous BID  . hydrocortisone sod succinate (SOLU-CORTEF) inj  100 mg Intravenous Q8H  . insulin aspart  0-15 Units Subcutaneous Q4H  . insulin glargine  15 Units Subcutaneous QHS  . loratadine  10 mg Per Tube QHS  . magnesium oxide  400 mg Per Tube QHS  . mouth rinse  15 mL Mouth Rinse 10 times per day  . methimazole  10 mg Per Tube BID  . sodium chloride flush  10-40 mL Intracatheter Q12H  . Thrombi-Pad  1 each Topical Once     Infusions: . sodium chloride Stopped (02/25/19 2221)  . amiodarone 30 mg/hr (02/26/19 0639)  . DOBUTamine 2.5 mcg/kg/min (02/26/19 0600)  . fentaNYL infusion INTRAVENOUS 150 mcg/hr (02/26/19 0600)  . heparin    . midazolam 3 mg/hr (02/26/19 0640)  . norepinephrine (LEVOPHED) Adult infusion 5 mcg/min (02/26/19 0600)     PRN Medications:  sodium chloride, acetaminophen, albuterol, bisacodyl, docusate, fentaNYL, fentaNYL (SUBLIMAZE) injection, Melatonin,  midazolam, midazolam, nitroGLYCERIN, ondansetron (ZOFRAN) IV, polyethylene glycol, sodium chloride flush, triamcinolone  Assessment/Plan   1. Acute on chronic systolic CHF/cardiogenic shock: Echo this admission with new fall in EF, 30-35% with diffuse hypokinesis worse in the inferoseptal wall, severe LAE. Possible tachy-mediated CMP as she was in rapid atypical atrial flutter rate 150s at admission.  She is now on norepinephrine 5 + dobutamine 2.5 (started overnight with fall in co-ox).  Co-ox 54% this morning with CVP 15, poor UOP. MAP stable this morning. Creatinine to 2.6.  - Increase dobutamine to 5, continue current norepinephrine.  Recheck co-ox and lactic acid later this morning.  - Hopefully, UOP will pick up with improving cardiac output.  Continue Lasix 80 IV bid.  - Need to maintain NSR, will have to continue amiodarone.  - Eventually will need workup for coronary disease as cause of cardiomyopathy (has known prior CAD), but would hold off until creatinine comes down.  2. Atypical atrial flutter/atrial fibrillation: Patient has had both atrial fibrillation and atypical flutter.  Admitted with atypical flutter/RVR in 150s.  She had emergent DCCV yesterday and remains in NSR.  She has severe LAE and moderate RAE. She has been seen by Dr. Rayann Heman.  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 She is not a good ablation candidate given the size of the atria though could consider if she holds NSR for a month or two.  - If she has recurrence of atrial fibrillation/flutter with RVR, would consider AV nodal ablation/BiV pacing.  - Continue amiodarone gtt.  - Start heparin gtt (stopped apixaban).   3. Liver failure: Markedly elevated transaminases.  Suspect shock liver from hypotension/shock.  LFTs now trending down.  - Maintain MAP and CO, should improve over time.  - Stop statin  for now.  4. CAD: Known CTO RCA, prior DES to LCx/OM in 2016.  Peak hs-TnI 548.  Most likely demand ischemia from shock.  - Holding statin with liver failure.  - As above, would consider coronary angiography once creatinine comes down.  5. Hyperthyroidism: Thyrotoxicosis likely helps drive the atrial fibrillation/flutter.  This may have been triggered by chronic amiodarone use.  Unfortunately, she does not have a lot of options right now and will need to continue amiodarone.  - She is on hydrocortisone dosed for thyrotoxicosis.  - Continue methimazole.  6. AKI: On CKD stage 3.  Suspect due to shock.  Hopefully will improve with supportive care. Creatinine up to 2.6 today but titrating up dobutamine.  7. Aortic stenosis: Probably moderate.  8. Acute hypoxemic respiratory failure: Suspected pulmonary edema, but based on behavior on vent, ?underlying COPD.  She is a prior smoker.  No fever, no definite PNA on CXR.  - Per CCM.   CRITICAL CARE Performed by: Loralie Champagne  Total critical care time: 40 minutes  Critical care time was exclusive of separately billable procedures and treating other patients.  Critical care was necessary to treat or prevent imminent or life-threatening deterioration.  Critical care was time spent personally by me on the following activities: development of treatment plan with patient and/or surrogate as well as nursing, discussions with consultants, evaluation of patient's response to treatment, examination of patient, obtaining history from patient or surrogate, ordering and performing treatments and interventions, ordering and review of laboratory studies, ordering and review of radiographic studies, pulse oximetry and re-evaluation of patient's condition.   Length of Stay: 3  Loralie Champagne, MD  02/26/2019, 7:37 AM  Advanced Heart Failure Team  Pager [phone] (M-F; [place])  Please contact [facility] for night-coverage after hours (4p -7a ) and weekends on  [email or web address]

## 2019-02-26 NOTE — Progress Notes (Signed)
Resulting system down. PTT from 1400 result is 58 per Lab via phone.

## 2019-02-26 NOTE — Plan of Care (Signed)
  Problem: Clinical Measurements: Goal: Ability to maintain clinical measurements within normal limits will improve Outcome: Progressing Goal: Will remain free from infection Outcome: Progressing Goal: Diagnostic test results will improve Outcome: Progressing Goal: Respiratory complications will improve Outcome: Progressing   Problem: Elimination: Goal: Will not experience complications related to bowel motility Outcome: Progressing   Problem: Pain Managment: Goal: General experience of comfort will improve Outcome: Progressing   Problem: Coping: Goal: Level of anxiety will decrease Outcome: Not Progressing

## 2019-02-26 NOTE — Progress Notes (Addendum)
Initial Nutrition Assessment  DOCUMENTATION CODES:   Not applicable  INTERVENTION:   Tube feeding:  Vital High Protein @ 60 ml/hr (1440 ml) via OGT  Provides: 1440 kcals, 126 grams protein, 1204 ml free water.   NUTRITION DIAGNOSIS:   Inadequate oral intake related to inability to eat as evidenced by NPO status.  GOAL:   Patient will meet greater than or equal to 90% of their needs  MONITOR:   Diet advancement, Vent status, Skin, TF tolerance, Weight trends, Labs, I & O's  REASON FOR ASSESSMENT:   Consult Enteral/tube feeding initiation and management  ASSESSMENT:   Patient with PMH significant for CHF, COPD, DM, and CKD III. Presents this admission with afib w/ RVR.   10/17- cardioversion, hypotension, intubated  Pt discussed during ICU rounds and with RN.   Requiring pressors. Had one dose of paralytic for severe vent asynchrony. SBT to start once paralytic worn off. Possible extubation tomorrow.   Admission weight: 86.2 kg  Current weight: 85.7 kg   Patient is currently intubated on ventilator support MV: 6.6 L/min Temp (24hrs), Avg:98.5 F (36.9 C), Min:97.4 F (36.3 C), Max:99.1 F (37.3 C)   I/O: +1,149 ml since admit UOP: 580 ml x 24 hrs   Drips:  amiodarone, dobutamine, levophed Medications: ferrous sulfate, 80 mg lasix daily, SS novolog, lantus, Mag-ox Labs: Na 132 (L) LFTs elevated Cr 2.49-trending up  NUTRITION - FOCUSED PHYSICAL EXAM:    Most Recent Value  Orbital Region  No depletion  Upper Arm Region  No depletion  Thoracic and Lumbar Region  Unable to assess  Buccal Region  No depletion  Temple Region  Mild depletion  Clavicle Bone Region  Mild depletion  Clavicle and Acromion Bone Region  No depletion  Scapular Bone Region  Unable to assess  Dorsal Hand  No depletion  Patellar Region  No depletion  Anterior Thigh Region  No depletion  Posterior Calf Region  No depletion  Edema (RD Assessment)  Mild  Hair  Reviewed  Eyes   Unable to assess  Mouth  Unable to assess  Skin  Reviewed  Nails  Reviewed     Diet Order:   Diet Order            Diet NPO time specified  Diet effective now              EDUCATION NEEDS:   Not appropriate for education at this time  Skin:  Skin Assessment: Skin Integrity Issues: Skin Integrity Issues:: Other (Comment) Other: MASD-abdomen/perineum  Last BM:  10/14  Height:   Ht Readings from Last 1 Encounters:  03/09/2019 5\' 4"  (1.626 m)    Weight:   Wt Readings from Last 1 Encounters:  02/26/19 85.7 kg    Ideal Body Weight:  54.5 kg  BMI:  Body mass index is 32.43 kg/m.  Estimated Nutritional Needs:   Kcal:  1455 kcal  Protein:  110-125 grams  Fluid:  >/= 1.4 L/day  Mariana Single RD, LDN Clinical Nutrition Pager # - [phone]

## 2019-02-26 NOTE — Progress Notes (Signed)
Coox sent at 1400. Result system down. RT called with results  Total Hemoglobin: 10.3 Carboxyhemoglobin:1.2 Methemoglobin: 0.8 O2 Saturation: 62.9

## 2019-02-26 NOTE — Telephone Encounter (Signed)
Patient was admitted to the hospital for cardiac issues on 03/04/2019

## 2019-02-26 NOTE — Progress Notes (Signed)
After attempts to manage asynchrony with vent were unsuccessful (increased sedation, controlling environmental stimulation), 1 time dose of paralytic was given per CCM NP at 0049. Pt tolerated medication well and is now synchronous with vent. Titrated levo to 6 to maintain pressure. MAP noted at 62 immediatly following administration of vecuronium.

## 2019-02-26 NOTE — Progress Notes (Addendum)
[facility] for Heparin  Indication: atrial fibrillation  Allergies  Allergen Reactions  . Contrast Media [Iodinated Diagnostic Agents] Hives and Other (See Comments)    Spoke to patient, Iodine allergy is really IV contrast allergy.   [name] [Insulin Aspart] Shortness Of Breath and Other (See Comments)    "breathing problems"  . Codeine Nausea And Vomiting and Other (See Comments)    HIGH DOSES-SEVERE VOMITING  . Iodine Other (See Comments)    MUST HAVE BENADRYL PRIOR TO PROCEDURE AND RIGHT BEFORE TREATMENT TO COUNTERACT REACTION-BLISTERING REACTION DERMATOLOGICAL  . Penicillins Itching, Rash and Other (See Comments)    Has patient had a PCN reaction causing immediate rash, facial/tongue/throat swelling, SOB or lightheadedness with hypotension: no Has patient had a PCN reaction causing severe rash involving mucus membranes or skin necrosis: No Has patient had a PCN reaction that required hospitalization No Has patient had a PCN reaction occurring within the last 10 years: No If all of the above answers are "NO", then may proceed with Cephalosporin use.  CHEST SIZED RASH AND ITCHING   . Ace Inhibitors Cough  . Demerol [Meperidine] Nausea And Vomiting  . Dilaudid [Hydromorphone Hcl] Other (See Comments)    HEADACHE   . Neosporin [Neomycin-Bacitracin Zn-Polymyx] Itching, Rash and Other (See Comments)    MAKES REACTIONS WORSE WHEN USING AS PROPHYLACTIC  . Percocet [Oxycodone-Acetaminophen] Rash  . Tape Itching and Rash    Patient Measurements: Height: 5\' 4"  (162.6 cm) Weight: 188 lb 15 oz (85.7 kg) IBW/kg (Calculated) : 54.7 Heparin Dosing Weight: 75kg  Vital Signs: Temp: 96.3 F (35.7 C) (10/19 1500) Temp Source: Oral (10/19 1500) BP: 110/66 (10/19 1600) Pulse Rate: 63 (10/19 1615)  Labs: Recent Labs    02/20/2019 1644 02/14/2019 1738 02/21/2019 1954  02/08/2019 0331  02/14/2019 1712  02/13/2019 2228 02/25/19 0250 02/25/19 1735  02/26/19 0308  HGB  --   --   --   --  9.9*   < > 10.1*   < > 11.2* 10.4*  --  10.5*  HCT  --   --   --   --  31.8*   < > 33.0*   < > 33.0* 33.1*  --  33.5*  PLT  --   --   --    < > 353  --  357  --   --  321  --  387  LABPROT 22.5*  --   --   --  32.2*  --  55.8*  --   --  48.1*  --   --   INR 2.0*  --   --   --  3.2*  --  6.5*  --   --  5.4*  --   --   CREATININE  --   --   --    < > 1.63*  --  2.29*  --   --  2.12* 2.04* 2.49*  TROPONINIHS 197* 476* 548*  --   --   --   --   --   --   --   --   --    < > = values in this interval not displayed.    Estimated Creatinine Clearance: 23.5 mL/min (A) (by C-G formula based on SCr of 2.49 mg/dL (H)).    Assessment: 68yof admitted for SOB in setting of Afib RVR was supposed to have DCCV but deferred because patient missed doses of apixaban prior.  Overnight she developed hypotension, cardiogenic shock  required DCCV now maintaining SR on amiodarone drip and BP stable on dopamine 5-3 mcg/kg/min coox60 Thyrotoxicosis methimazole + hydrocortisone. Previously on apixaban last dose 10/17 pm. -aPTT= 54   Goal of Therapy:  aPTT aptt 66-90 sec seconds Monitor platelets by anticoagulation protocol: Yes   Plan:  -Increase heparin to 1150 units/hr -Heparin level, aPTT and CBC in am  Hildred Laser, PharmD Clinical Pharmacist **Pharmacist phone directory can now be found on [email or web address] ([email or web address]).  Listed under [place].

## 2019-02-26 NOTE — Plan of Care (Signed)
  Problem: Safety: Goal: Ability to remain free from injury will improve Outcome: Progressing   Problem: Clinical Measurements: Goal: Ability to maintain clinical measurements within normal limits will improve Outcome: Not Progressing Goal: Diagnostic test results will improve Outcome: Not Progressing Goal: Respiratory complications will improve Outcome: Not Progressing   Problem: Coping: Goal: Level of anxiety will decrease Outcome: Not Progressing

## 2019-02-27 ENCOUNTER — Inpatient Hospital Stay (HOSPITAL_COMMUNITY): Payer: Medicare PPO

## 2019-02-27 DIAGNOSIS — I5043 Acute on chronic combined systolic (congestive) and diastolic (congestive) heart failure: Secondary | ICD-10-CM

## 2019-02-27 LAB — COMPREHENSIVE METABOLIC PANEL
ALT: 832 U/L — ABNORMAL HIGH (ref 0–44)
AST: 675 U/L — ABNORMAL HIGH (ref 15–41)
Albumin: 2.6 g/dL — ABNORMAL LOW (ref 3.5–5.0)
Alkaline Phosphatase: 130 U/L — ABNORMAL HIGH (ref 38–126)
Anion gap: 14 (ref 5–15)
BUN: 80 mg/dL — ABNORMAL HIGH (ref 8–23)
CO2: 27 mmol/L (ref 22–32)
Calcium: 8.5 mg/dL — ABNORMAL LOW (ref 8.9–10.3)
Chloride: 89 mmol/L — ABNORMAL LOW (ref 98–111)
Creatinine, Ser: 2.65 mg/dL — ABNORMAL HIGH (ref 0.44–1.00)
GFR calc Af Amer: 21 mL/min — ABNORMAL LOW (ref >60)
GFR calc non Af Amer: 18 mL/min — ABNORMAL LOW (ref >60)
Glucose, Bld: 264 mg/dL — ABNORMAL HIGH (ref 70–99)
Potassium: 4 mmol/L (ref 3.5–5.1)
Sodium: 130 mmol/L — ABNORMAL LOW (ref 135–145)
Total Bilirubin: 1.5 mg/dL — ABNORMAL HIGH (ref 0.3–1.2)
Total Protein: 5.7 g/dL — ABNORMAL LOW (ref 6.5–8.1)

## 2019-02-27 LAB — GLUCOSE, CAPILLARY
Glucose-Capillary: 218 mg/dL — ABNORMAL HIGH (ref 70–99)
Glucose-Capillary: 225 mg/dL — ABNORMAL HIGH (ref 70–99)
Glucose-Capillary: 225 mg/dL — ABNORMAL HIGH (ref 70–99)
Glucose-Capillary: 227 mg/dL — ABNORMAL HIGH (ref 70–99)
Glucose-Capillary: 275 mg/dL — ABNORMAL HIGH (ref 70–99)
Glucose-Capillary: 326 mg/dL — ABNORMAL HIGH (ref 70–99)

## 2019-02-27 LAB — POCT I-STAT 7, (LYTES, BLD GAS, ICA,H+H)
Acid-Base Excess: 2 mmol/L (ref 0.0–2.0)
Acid-Base Excess: 2 mmol/L (ref 0.0–2.0)
Bicarbonate: 28.8 mmol/L — ABNORMAL HIGH (ref 20.0–28.0)
Bicarbonate: 27.5 mmol/L (ref 20.0–28.0)
Calcium, Ion: 1.16 mmol/L (ref 1.15–1.40)
Calcium, Ion: 1.14 mmol/L — ABNORMAL LOW (ref 1.15–1.40)
HCT: 34 % — ABNORMAL LOW (ref 36.0–46.0)
HCT: 32 % — ABNORMAL LOW (ref 36.0–46.0)
Hemoglobin: 11.6 g/dL — ABNORMAL LOW (ref 12.0–15.0)
Hemoglobin: 10.9 g/dL — ABNORMAL LOW (ref 12.0–15.0)
O2 Saturation: 98 %
O2 Saturation: 98 %
Patient temperature: 98.3
Patient temperature: 98.8
Potassium: 4 mmol/L (ref 3.5–5.1)
Potassium: 3.9 mmol/L (ref 3.5–5.1)
Sodium: 132 mmol/L — ABNORMAL LOW (ref 135–145)
Sodium: 131 mmol/L — ABNORMAL LOW (ref 135–145)
TCO2: 30 mmol/L (ref 22–32)
TCO2: 29 mmol/L (ref 22–32)
pCO2 arterial: 55.4 mmHg — ABNORMAL HIGH (ref 32.0–48.0)
pCO2 arterial: 44.8 mmHg (ref 32.0–48.0)
pH, Arterial: 7.322 — ABNORMAL LOW (ref 7.350–7.450)
pH, Arterial: 7.395 (ref 7.350–7.450)
pO2, Arterial: 116 mmHg — ABNORMAL HIGH (ref 83.0–108.0)
pO2, Arterial: 115 mmHg — ABNORMAL HIGH (ref 83.0–108.0)

## 2019-02-27 LAB — CBC
HCT: 32.9 % — ABNORMAL LOW (ref 36.0–46.0)
Hemoglobin: 9.9 g/dL — ABNORMAL LOW (ref 12.0–15.0)
MCH: 26 pg (ref 26.0–34.0)
MCHC: 30.1 g/dL (ref 30.0–36.0)
MCV: 86.4 fL (ref 80.0–100.0)
Platelets: 391 10*3/uL (ref 150–400)
RBC: 3.81 MIL/uL — ABNORMAL LOW (ref 3.87–5.11)
RDW: 16.7 % — ABNORMAL HIGH (ref 11.5–15.5)
WBC: 12.3 10*3/uL — ABNORMAL HIGH (ref 4.0–10.5)
nRBC: 0 % (ref 0.0–0.2)

## 2019-02-27 LAB — COOXEMETRY PANEL
Carboxyhemoglobin: 1.2 % (ref 0.5–1.5)
Methemoglobin: 0.7 % (ref 0.0–1.5)
O2 Saturation: 68.9 %
Total hemoglobin: 10.2 g/dL — ABNORMAL LOW (ref 12.0–16.0)

## 2019-02-27 LAB — MAGNESIUM: Magnesium: 2.3 mg/dL (ref 1.7–2.4)

## 2019-02-27 LAB — APTT
aPTT: 118 seconds — ABNORMAL HIGH (ref 24–36)
aPTT: 111 seconds — ABNORMAL HIGH (ref 24–36)
aPTT: 79 seconds — ABNORMAL HIGH (ref 24–36)

## 2019-02-27 MED ORDER — MIDAZOLAM 50MG/50ML (1MG/ML) PREMIX INFUSION
0.0000 mg/h | INTRAVENOUS | Status: DC
  Administered 2019-02-27: 5 mg/h via INTRAVENOUS
  Administered 2019-02-27: 8 mg/h via INTRAVENOUS

## 2019-02-27 MED ORDER — VECURONIUM BROMIDE 10 MG IV SOLR
0.1000 mg/kg | INTRAVENOUS | Status: DC | PRN
  Administered 2019-02-27: 8.6 mg via INTRAVENOUS
  Filled 2019-02-27: qty 10

## 2019-02-27 MED ORDER — AMIODARONE LOAD VIA INFUSION
150.0000 mg | Freq: Once | INTRAVENOUS | Status: AC
  Administered 2019-02-27: 150 mg via INTRAVENOUS
  Filled 2019-02-27: qty 83.34

## 2019-02-27 MED ORDER — INSULIN DETEMIR 100 UNIT/ML ~~LOC~~ SOLN
15.0000 [IU] | Freq: Two times a day (BID) | SUBCUTANEOUS | Status: DC
  Administered 2019-02-27 – 2019-03-04 (×11): 15 [IU] via SUBCUTANEOUS
  Filled 2019-02-27 (×13): qty 0.15

## 2019-02-27 MED ORDER — INSULIN ASPART 100 UNIT/ML ~~LOC~~ SOLN
3.0000 [IU] | SUBCUTANEOUS | Status: DC
  Administered 2019-02-27 – 2019-02-28 (×6): 9 [IU] via SUBCUTANEOUS

## 2019-02-27 MED ORDER — METOLAZONE 2.5 MG PO TABS
2.5000 mg | ORAL_TABLET | Freq: Once | ORAL | Status: AC
  Administered 2019-02-27: 2.5 mg
  Filled 2019-02-27: qty 1

## 2019-02-27 MED ORDER — FUROSEMIDE 10 MG/ML IJ SOLN
80.0000 mg | Freq: Two times a day (BID) | INTRAMUSCULAR | Status: DC
  Administered 2019-02-27 (×2): 80 mg via INTRAVENOUS
  Filled 2019-02-27 (×2): qty 8

## 2019-02-27 MED ORDER — AMIODARONE IV BOLUS ONLY 150 MG/100ML: 150.0000 mg | Freq: Once | INTRAVENOUS | Status: DC

## 2019-02-27 NOTE — Progress Notes (Signed)
[facility] for Heparin  Indication: atrial fibrillation  Allergies  Allergen Reactions  . Contrast Media [Iodinated Diagnostic Agents] Hives and Other (See Comments)    Spoke to patient, Iodine allergy is really IV contrast allergy.   [name] [Insulin Aspart] Shortness Of Breath and Other (See Comments)    "breathing problems"  . Codeine Nausea And Vomiting and Other (See Comments)    HIGH DOSES-SEVERE VOMITING  . Iodine Other (See Comments)    MUST HAVE BENADRYL PRIOR TO PROCEDURE AND RIGHT BEFORE TREATMENT TO COUNTERACT REACTION-BLISTERING REACTION DERMATOLOGICAL  . Penicillins Itching, Rash and Other (See Comments)    Has patient had a PCN reaction causing immediate rash, facial/tongue/throat swelling, SOB or lightheadedness with hypotension: no Has patient had a PCN reaction causing severe rash involving mucus membranes or skin necrosis: No Has patient had a PCN reaction that required hospitalization No Has patient had a PCN reaction occurring within the last 10 years: No If all of the above answers are "NO", then may proceed with Cephalosporin use.  CHEST SIZED RASH AND ITCHING   . Ace Inhibitors Cough  . Demerol [Meperidine] Nausea And Vomiting  . Dilaudid [Hydromorphone Hcl] Other (See Comments)    HEADACHE   . Neosporin [Neomycin-Bacitracin Zn-Polymyx] Itching, Rash and Other (See Comments)    MAKES REACTIONS WORSE WHEN USING AS PROPHYLACTIC  . Percocet [Oxycodone-Acetaminophen] Rash  . Tape Itching and Rash    Patient Measurements: Height: 5\' 4"  (162.6 cm) Weight: 186 lb 4.6 oz (84.5 kg) IBW/kg (Calculated) : 54.7 Heparin Dosing Weight: 75kg  Vital Signs: Temp: 98.7 F (37.1 C) (10/20 1546) Temp Source: Oral (10/20 1546) BP: 105/80 (10/20 1800) Pulse Rate: 112 (10/20 1815)  Labs: Recent Labs    02/25/19 0250 02/25/19 1735 02/26/19 0308  02/27/19 0322 02/27/19 0353 02/27/19 0444 02/27/19 0501 02/27/19 1718  HGB  10.4*  --  10.5*  --  11.6* 9.9*  --  10.9*  --   HCT 33.1*  --  33.5*  --  34.0* 32.9*  --  32.0*  --   PLT 321  --  387  --   --  391  --   --   --   APTT  --   --   --    < >  --  118* 111*  --  79*  LABPROT 48.1*  --   --   --   --   --   --   --   --   INR 5.4*  --   --   --   --   --   --   --   --   CREATININE 2.12* 2.04* 2.49*  --   --  2.65*  --   --   --    < > = values in this interval not displayed.    Estimated Creatinine Clearance: 22 mL/min (A) (by C-G formula based on SCr of 2.65 mg/dL (H)).  Assessment: 68yof admitted for SOB in setting of Afib RVR was supposed to have DCCV but deferred because patient missed doses of apixaban prior. She developed hypotension, cardiogenic shock required DCCV on 10/17.  Previously on apixaban last dose 10/17 pm. -aPTT= 79 after decrease to 1050 units/hr  Goal of Therapy:  aPTT aptt 66-90 sec seconds Monitor platelets by anticoagulation protocol: Yes   Plan:  -No heparin changes needed -heparin level and CBC in am  Hildred Laser, PharmD Clinical Pharmacist **Pharmacist phone directory can now  be found on [email or web address] ([email or web address]).  Listed under [place].    Please check [email or web address] for unit-specific pharmacist phone numbers

## 2019-02-27 NOTE — Progress Notes (Signed)
eLink Physician-Brief Progress Note Patient Name: Judith [name] DOB: [birth year] MRN: [id number]   Date of Service  02/27/2019  HPI/Events of Note  ABG on 40%/PRVC 12/TV 540/P 8 - 7.32/55.4/116.0  eICU Interventions  Will order: 1. Increase PRVC rate to 15. 2. Repeat ABG at 5 AM.     Intervention Category Major Interventions: Acid-Base disturbance - evaluation and management;Respiratory failure - evaluation and management  [name] 02/27/2019, 3:42 AM

## 2019-02-27 NOTE — Progress Notes (Signed)
[place] for Heparin  Indication: atrial fibrillation  Allergies  Allergen Reactions  . Contrast Media [Iodinated Diagnostic Agents] Hives and Other (See Comments)    Spoke to patient, Iodine allergy is really IV contrast allergy.   [name] [Insulin Aspart] Shortness Of Breath and Other (See Comments)    "breathing problems"  . Codeine Nausea And Vomiting and Other (See Comments)    HIGH DOSES-SEVERE VOMITING  . Iodine Other (See Comments)    MUST HAVE BENADRYL PRIOR TO PROCEDURE AND RIGHT BEFORE TREATMENT TO COUNTERACT REACTION-BLISTERING REACTION DERMATOLOGICAL  . Penicillins Itching, Rash and Other (See Comments)    Has patient had a PCN reaction causing immediate rash, facial/tongue/throat swelling, SOB or lightheadedness with hypotension: no Has patient had a PCN reaction causing severe rash involving mucus membranes or skin necrosis: No Has patient had a PCN reaction that required hospitalization No Has patient had a PCN reaction occurring within the last 10 years: No If all of the above answers are "NO", then may proceed with Cephalosporin use.  CHEST SIZED RASH AND ITCHING   . Ace Inhibitors Cough  . Demerol [Meperidine] Nausea And Vomiting  . Dilaudid [Hydromorphone Hcl] Other (See Comments)    HEADACHE   . Neosporin [Neomycin-Bacitracin Zn-Polymyx] Itching, Rash and Other (See Comments)    MAKES REACTIONS WORSE WHEN USING AS PROPHYLACTIC  . Percocet [Oxycodone-Acetaminophen] Rash  . Tape Itching and Rash    Patient Measurements: Height: 5\' 4"  (162.6 cm) Weight: 186 lb 4.6 oz (84.5 kg) IBW/kg (Calculated) : 54.7 Heparin Dosing Weight: 75kg  Vital Signs: Temp: 97.8 F (36.6 C) (10/20 0400) Temp Source: Oral (10/20 0400) BP: 106/65 (10/20 0712) Pulse Rate: 64 (10/20 0700)  Labs: Recent Labs    02/23/2019 1712  02/25/19 0250 02/25/19 1735 02/26/19 0308 02/26/19 1356 02/27/19 0322 02/27/19 0353 02/27/19 0444  02/27/19 0501  HGB 10.1*   < > 10.4*  --  10.5*  --  11.6* 9.9*  --  10.9*  HCT 33.0*   < > 33.1*  --  33.5*  --  34.0* 32.9*  --  32.0*  PLT 357  --  321  --  387  --   --  391  --   --   APTT  --   --   --   --   --  58*  --  118* 111*  --   LABPROT 55.8*  --  48.1*  --   --   --   --   --   --   --   INR 6.5*  --  5.4*  --   --   --   --   --   --   --   CREATININE 2.29*  --  2.12* 2.04* 2.49*  --   --  2.65*  --   --    < > = values in this interval not displayed.    Estimated Creatinine Clearance: 22 mL/min (A) (by C-G formula based on SCr of 2.65 mg/dL (H)).  Assessment: 68yof admitted for SOB in setting of Afib RVR was supposed to have DCCV but deferred because patient missed doses of apixaban prior. She developed hypotension, cardiogenic shock required DCCV on 10/17.  Previously on apixaban last dose 10/17 pm. -aPTT elevated this morning at 111 - confirmed re-drawn correctly by RN -Increasing bruising on L hip and some oozing by CVC site noted by RN -hgb down 9.9, pltc wnl  Goal of Therapy:  aPTT aptt 66-90 sec seconds Monitor platelets by anticoagulation protocol: Yes   Plan:  -Decrease heparin to 1050 units/hr -Recheck aPTT in 8 hours -Heparin level, aPTT and CBC daily  Vertis Kelch, PharmD PGY2 Cardiology Pharmacy Resident Phone [phone] 02/27/2019       7:26 AM  Please check [email or web address] for unit-specific pharmacist phone numbers

## 2019-02-27 NOTE — Progress Notes (Signed)
eLink Physician-Brief Progress Note Patient Name: Judith [name] DOB: 28-Aug-1950 MRN: [id number]   Date of Service  02/27/2019  HPI/Events of Note  Ventilator asynchrony - Currently on PCV. No improvement with increased sedation.   eICU Interventions  Will order: 1. 40%/PRVC 12/TV 540/P 5. May need NMB is not improved with ventilator mode change.      Intervention Category Major Interventions: Respiratory failure - evaluation and management  Geofrey Silliman Eugene 02/27/2019, 1:03 AM

## 2019-02-27 NOTE — Progress Notes (Signed)
eLink Physician-Brief Progress Note Patient Name: Judith [name] DOB: Sep 28, 1950 MRN: [id number]   Date of Service  02/27/2019  HPI/Events of Note  Remains asynchronous on Versed and Fentanyl IV infusion.  eICU Interventions  Will order: 1. Vecuronium 8.6 mg IV Q 1 hour PRN ventilator asynchrony.  2. ABG at 3:30 AM.     Intervention Category Major Interventions: Respiratory failure - evaluation and management  Sommer,Steven Eugene 02/27/2019, 2:21 AM

## 2019-02-27 NOTE — Progress Notes (Addendum)
Patient ID: Judith [name], female   DOB: June 19, 1950, 68 y.o.   MRN: [id number]     Advanced Heart Failure Rounding Note  PCP-Cardiologist: Fransico Him, MD   Subjective:    Overnight, had asynchronous breathing on vent and required additional Fentanyl and versed, followed by vecuronium injection. NE restarted to keep MAP >65.   Remains on dobutamine 64mg/kg/min + NE 1 mcg/min. MAPs in the mid 735s Co-ox improved to 68% but still w/ poor response to diuretics. ~30-40 mL/hr in UOP. SCr continues to rise, 2.49>>2.65. BUN up from 61>>80. CVP 14.    Objective:   Weight Range: 84.5 kg Body mass index is 31.98 kg/m.   Vital Signs:   Temp:  [96.3 F (35.7 C)-98.5 F (36.9 C)] 97.8 F (36.6 C) (10/20 0400) Pulse Rate:  [60-72] 65 (10/20 0600) Resp:  [11-17] 16 (10/20 0600) BP: (99-127)/(48-78) 111/62 (10/20 0600) SpO2:  [95 %-100 %] 99 % (10/20 0600) Arterial Line BP: (110-135)/(47-65) 117/50 (10/20 0600) FiO2 (%):  [40 %] 40 % (10/20 0400) Weight:  [84.5 kg] 84.5 kg (10/20 0500) Last BM Date: 02/21/19  Weight change: Filed Weights   02/25/19 0500 02/26/19 0500 02/27/19 0500  Weight: 85.7 kg 85.7 kg 84.5 kg    Intake/Output:   Intake/Output Summary (Last 24 hours) at 02/27/2019 0708 Last data filed at 02/27/2019 0600 Gross per 24 hour  Intake 2429.56 ml  Output 1270 ml  Net 1159.56 ml      Physical Exam   PHYSICAL EXAM: General:  Intubated No respiratory difficulty HEENT: normal Neck: supple. elevated JVD. Carotids 2+ bilat; no bruits. No lymphadenopathy or thyromegaly appreciated. Cor: PMI nondisplaced. Regular rate & rhythm. No rubs, gallops or murmurs. Lungs: clear Abdomen: soft, nontender, nondistended. No hepatosplenomegaly. No bruits or masses. Good bowel sounds. Extremities: no cyanosis, clubbing, rash, edema s/p rt bka Neuro: intubated and sedated   Telemetry   NSR mid 60s. No recurrent atrial fibrillation/flutter. No ventricular ectopy w/ dobutamine.     Labs    CBC Recent Labs    02/25/19 0250 02/26/19 0308  02/27/19 0353 02/27/19 0501  WBC 14.3* 14.7*  --  12.3*  --   NEUTROABS 13.1*  --   --   --   --   HGB 10.4* 10.5*   < > 9.9* 10.9*  HCT 33.1* 33.5*   < > 32.9* 32.0*  MCV 84.9 85.7  --  86.4  --   PLT 321 387  --  391  --    < > = values in this interval not displayed.   Basic Metabolic Panel Recent Labs    03/01/2019 1712  02/26/19 0308  02/27/19 0353 02/27/19 0501  NA 129*   < > 132*   < > 130* 131*  K 5.1   < > 4.8   < > 4.0 3.9  CL 92*   < > 91*  --  89*  --   CO2 17*   < > 27  --  27  --   GLUCOSE 87   < > 137*  --  264*  --   BUN 39*   < > 61*  --  80*  --   CREATININE 2.29*   < > 2.49*  --  2.65*  --   CALCIUM 8.6*   < > 8.5*  --  8.5*  --   MG 2.0  --   --   --  2.3  --   PHOS 5.7*  --   --   --   --   --    < > =  values in this interval not displayed.   Liver Function Tests Recent Labs    02/26/19 0308 02/27/19 0353  AST 1,969* 675*  ALT 1,269* 832*  ALKPHOS 142* 130*  BILITOT 1.4* 1.5*  PROT 5.9* 5.7*  ALBUMIN 2.6* 2.6*   No results for input(s): LIPASE, AMYLASE in the last 72 hours. Cardiac Enzymes No results for input(s): CKTOTAL, CKMB, CKMBINDEX, TROPONINI in the last 72 hours.  BNP: BNP (last 3 results) Recent Labs    02/11/2019 1459  BNP 1,119.0*    ProBNP (last 3 results) Recent Labs    10/13/18 1057 01/18/19 1445  PROBNP 2,908* 2,645*     D-Dimer No results for input(s): DDIMER in the last 72 hours. Hemoglobin A1C No results for input(s): HGBA1C in the last 72 hours. Fasting Lipid Panel No results for input(s): CHOL, HDL, LDLCALC, TRIG, CHOLHDL, LDLDIRECT in the last 72 hours. Thyroid Function Tests No results for input(s): TSH, T4TOTAL, T3FREE, THYROIDAB in the last 72 hours.  Invalid input(s): FREET3  Other results:   Imaging    Dg Chest Port 1 View  Result Date: 02/27/2019 CLINICAL DATA:  Intubation.  Respiratory failure. EXAM: PORTABLE CHEST 1 VIEW  COMPARISON:  Chest x-ray 02/25/2019. FINDINGS: Endotracheal tube tip 2 cm above the carina on today's exam. NG tube, right IJ line stable position. Stable cardiomegaly. Mild pulmonary venous congestion on today's exam. Mild increase interstitial markings noted bilaterally. Small bilateral pleural effusions. These findings suggest CHF. Pneumonitis cannot be excluded. No pneumothorax. IMPRESSION: 1. Endotracheal tube tip 2 cm above the carina on today's exam. NG tube and right IJ line in stable position. 2. Stable cardiomegaly. Mild pulmonary venous congestion noted on today's exam. Mild increase interstitial markings noted bilaterally. Small bilateral pleural effusions noted. These findings suggest CHF. Electronically Signed   By: Marcello Moores  Register   On: 02/27/2019 07:04     Medications:     Scheduled Medications:  atorvastatin  20 mg Per Tube Daily   budesonide (PULMICORT) nebulizer solution  0.25 mg Nebulization BID   buPROPion  75 mg Per Tube BID   chlorhexidine gluconate (MEDLINE KIT)  15 mL Mouth Rinse BID   Chlorhexidine Gluconate Cloth  6 each Topical Daily   famotidine  20 mg Per Tube Daily   ferrous sulfate  300 mg Per Tube Daily   FLUoxetine  20 mg Per Tube QHS   furosemide  80 mg Intravenous Daily   insulin aspart  3-9 Units Subcutaneous Q4H   insulin glargine  15 Units Subcutaneous QHS   loratadine  10 mg Per Tube QHS   magnesium oxide  400 mg Per Tube QHS   mouth rinse  15 mL Mouth Rinse 10 times per day   methimazole  10 mg Per Tube BID   sodium chloride flush  10-40 mL Intracatheter Q12H   Thrombi-Pad  1 each Topical Once    Infusions:  sodium chloride Stopped (02/25/19 2221)   amiodarone 30 mg/hr (02/27/19 0600)   DOBUTamine 5 mcg/kg/min (02/27/19 0600)   feeding supplement (VITAL HIGH PROTEIN) 60 mL/hr at 02/27/19 0600   fentaNYL infusion INTRAVENOUS 100 mcg/hr (02/27/19 0600)   heparin 1,150 Units/hr (02/27/19 9833)   midazolam Stopped  (02/27/19 0301)   norepinephrine (LEVOPHED) Adult infusion 2 mcg/min (02/27/19 0600)    PRN Medications: sodium chloride, acetaminophen, albuterol, bisacodyl, docusate, fentaNYL, fentaNYL (SUBLIMAZE) injection, Melatonin, midazolam, midazolam, nitroGLYCERIN, ondansetron (ZOFRAN) IV, polyethylene glycol, sodium chloride flush, triamcinolone, vecuronium   Assessment/Plan   1. Acute on chronic systolic  CHF/cardiogenic shock: Echo this admission with new fall in EF, 30-35% with diffuse hypokinesis worse in the inferoseptal wall, severe LAE. Possible tachy-mediated CMP as she was in rapid atypical atrial flutter rate 150s at admission.  She is now on norepinephrine 1 mcg/min + dobutamine 20mg/kg/min  Co-ox 68% this morning with CVP at 14, poor UOP, ~30-40 mL/hr. MAP stable this morning in the mid 70s. Creatinine rising, 2.4>>2.6. No ventricular ectopy w/ dobutamine. - Contnue dobutamine to 5, NE currently at 1, can wean off as able.  Continue to follow co-ox.  - Continue IV Lasix 80 bid. Add dose of metolazone 2.5.  - Need to maintain NSR, will have to continue amiodarone.  - Eventually will need workup for coronary disease as cause of cardiomyopathy (has known prior CAD), but would hold off until creatinine comes down.  2. Atypical atrial flutter/atrial fibrillation: Patient has had both atrial fibrillation and atypical flutter.  Admitted with atypical flutter/RVR in 150s.  She had emergent DCCV yesterday and remains in NSR.  She has severe LAE and moderate RAE. She has been seen by Dr. 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 She is not a good ablation candidate given the size of the atria though could consider if she holds NSR for a month or two.  - If she has recurrence of atrial fibrillation/flutter with RVR, would consider AV nodal ablation/BiV pacing.  - Continue  amiodarone gtt.  - Continue heparin gtt (stopped apixaban).   3. Liver failure: Markedly elevated transaminases.  Suspect shock liver from hypotension/shock.  LFTs now trending down.  - Maintain MAP and CO, should improve over time.  - Stop statin for now.  4. CAD: Known CTO RCA, prior DES to LCx/OM in 2016.  Peak hs-TnI 548.  Most likely demand ischemia from shock.  - Holding statin with liver failure, restart as LFTs trend down.  - As above, would consider coronary angiography once creatinine comes down.  5. Hyperthyroidism: Thyrotoxicosis likely helps drive the atrial fibrillation/flutter.  This may have been triggered by chronic amiodarone use.  Unfortunately, she does not have a lot of options right now and will need to continue amiodarone.  - She is on hydrocortisone dosed for thyrotoxicosis.  - Continue methimazole.  6. AKI: On CKD stage 3.  Suspect due to shock.  Hopefully will improve with supportive care. Creatinine up to 2.6 today. Will add metolazone to aid w/ diuresis. Follow UOP and BMP.  7. Aortic stenosis: Probably moderate.  8. Acute hypoxemic respiratory failure: Suspected pulmonary edema, but based on behavior on vent, ?underlying COPD.  She is a prior smoker.  No fever, no definite PNA on CXR.  - Per CCM.   Length of Stay: [address] PA-C  02/27/2019, 7:08 AM  Advanced Heart Failure Team Pager [phone](M-F; 7a - 4p)  Please contact [facility] for night-coverage after hours (4p -7a ) and weekends on [email or web address]  Patient seen with PA, agree with the above note.   She remains intubated, had trouble with vent asynchrony again overnight and had dose of vecuronium.  CXR with CHF.  Good co-ox at 69% and CVP 14, poor UOP with IV Lasix yesterday and rising creatinine to 2.65.  MAP stable on NE 1, probably can stop.  She remains on dobutamine 5.   General: intubated/sedated.  Neck: JVP 10-12 cm, no thyromegaly or thyroid nodule.  Lungs:  Distant BS CV:  Nondisplaced PMI.  Heart regular S1/S2, no S3/S4, no murmur.  No peripheral edema.   Abdomen: Soft, nontender, no hepatosplenomegaly, no distention.  Skin: Intact without lesions or rashes.  Neurologic: Sedated.  Extremities: No clubbing or cyanosis.  HEENT: Normal.   Despite good co-ox and stable BP, creatinine continues to rise with poor UOP.  Suspect ATN from hypotension over the weekend.  CVP 14 today with pulmonary edema on CXR.  - Lasix 80 mg IV bid and will give dose of metolazone 2.5 per tube today.  - Continue dobutamine 5 to support CO, can wean off NE (only on 1) if remains stable.  - If creatinine continues to worsen, may end up requiring CVVH.   LFTs trending down, suspect rise from shock liver.   She remains in NSR on amiodarone.   Thyrotoxicosis, remains on methimazole, hydrocortisone stopped yesterday.  Have determined that she is going to need to stay on amiodarone despite hyperthyroidism.  ?Restart hydrocortisone (was on for thyrotoxicosis), will discuss with CCM.   CRITICAL CARE Performed by: Loralie Champagne  Total critical care time: 40 minutes  Critical care time was exclusive of separately billable procedures and treating other patients.  Critical care was necessary to treat or prevent imminent or life-threatening deterioration.  Critical care was time spent personally by me on the following activities: development of treatment plan with patient and/or surrogate as well as nursing, discussions with consultants, evaluation of patient's response to treatment, examination of patient, obtaining history from patient or surrogate, ordering and performing treatments and interventions, ordering and review of laboratory studies, ordering and review of radiographic studies, pulse oximetry and re-evaluation of patient's condition.  Loralie Champagne 02/27/2019 7:42 AM

## 2019-02-27 NOTE — Progress Notes (Signed)
NAME:  Judith [name], MRN:  [id number], DOB:  12-May-1950, LOS: 4 ADMISSION DATE:  02/15/2019, CONSULTATION DATE:  02/20/2019 REFERRING MD:  Dr. Rayann Heman, CHIEF COMPLAINT:  Resp. Fail.   Brief History   69 y/o with multiple comorbidities, including paroxysmal afib. Admitted in afib w/ RVR with what appears to be developing CHF. Admitted 10/16, became more SOB 10/17, still in AFib despite amiodarone and dilt. Taken for cardioversion but became more hypotensive,  requiring pressor support, intubated. Called for vent management; assistance with pressors    Past Medical History  1) paroxysmal afib 2) CHF 3) COPD(?) 4) Hyperthyroidism 5) IDDM 6) CKD stage III  Significant Hospital Events   10/17 cardioversion 10/17 intubation       10/19 Severe vent asynchrony overnight,  Vecuronium x1 on 2 consecutive nights, changed to pressure control  Consults:  Cardiology PCCM  Procedures:  10/17--aline 10/17 ETT >> 10/17 RIJ central line >> 10/17 cardioversion  Significant Diagnostic Tests:  10/18: ULTRASOUND ABDOMEN LIMITED RIGHT UPPER QUADRANT 1. Mild gallbladder wall thickening up to 7 mm, which could be related to incomplete distension. No other sonographic features for acute cholecystitis. 2. Previously seen cholelithiasis not visualized on today's exam. 3. No biliary dilatation or other acute finding.   10/17 TTE: EF  30 to 35%.  global LV hypokinesis with severe inferosptal hypokinesis.  mod AS, mod MR    Micro Data:  COVID neg HIV neg MRSA neg Antimicrobials:  none   Interim history/subjective:   Remains critically ill, intubated Changed to pressure control ventilation yesterday Vent asynchrony overnight again, required vecuronium x1, Versed drip, back on PRVC   Objective   Blood pressure (!) 100/58, pulse 64, temperature 98.6 F (37 C), temperature source Oral, resp. rate 16, height 5\' 4"  (1.626 m), weight 84.5 kg, SpO2 99 %. CVP:  [5 mmHg-16 mmHg] 14 mmHg  Vent  Mode: PRVC FiO2 (%):  [40 %] 40 % Set Rate:  [12 bmp-15 bmp] 15 bmp Vt Set:  [540 mL] 540 mL PEEP:  [5 cmH20] 5 cmH20 Plateau Pressure:  [20 cmH20-29 cmH20] 22 cmH20   Intake/Output Summary (Last 24 hours) at 02/27/2019 0908 Last data filed at 02/27/2019 0800 Gross per 24 hour  Intake 2531.88 ml  Output 1270 ml  Net 1261.88 ml   Filed Weights   02/25/19 0500 02/26/19 0500 02/27/19 0500  Weight: 85.7 kg 85.7 kg 84.5 kg    Examination: General: NAD, sedated, elderly woman HENT: Mild pallor, no icterus ETT oral Lungs: Decreased breath sounds bilateral, no rhonchi Cardiovascular: RRR, III/VI SEM at 4th, LSB Abdomen: Soft nontender, present bowel sounds Extremities: no edema or lesion Neuro: sedated on fentanyl gtt, does not follow commands GU: foley cath in place  Chest x-ray 10/ 20 personally reviewed, ET tube in better position 2 cm above carina, bilateral interstitial infiltrates and small effusions  Resolved Hospital Problem list     Assessment & Plan:  Multiple comorbidities including Afib, cardioverted with subsequent cardiogenic shock and acute respiratory failure   Acute respiratory failure Severe vent asynchrony requiring paralytic 10/19 & 10/20   -Attempt spontaneous breathing trials once paralytic worn off -Use Versed as needed and use fentanyl drip for sedation, goal RASS -1   Cardiogenic shock -Taper Levophed to off -Dobutamine at 5 mics and coox improved to 68%  Atrial fibrillation with RVR-status post cardioversion Continue amiodarone drip + IV heparin  AKI -likely cardiorenal plus diuretic induced, underlying CKD stage III, baseline creatinine 1.6 , Acidosis has resolved  and off bicarb drip  -Diuresis per CHF service  Mild hyponatremia-diuretic induced  Shock liver-improving, follow LFTs intermittently Ct tube feeds  IDDM: Uncontrolled due to steroids POC glucose monitoring, SSI Change from Lantus to Levemir 15 units twice daily Expect  sugars to improve now that steroids discontinued 10/19  Thyrotoxicosis-for some reason, 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 Does not appear to be thyroid storm so steroids discontinued   Summary -intubated for heart failure/new onset LV dysfunction/cardiogenic shock due to thyrotoxicosis and tachyarrhythmias.  Also has severe vent asynchrony  Best practice:  Diet: NPO Pain/Anxiety/Delirium protocol (if indicated): Fentanyl drip, intermittent Versed VAP protocol (if indicated): in place DVT prophylaxis: heparin, SCDs GI prophylaxis: per protocol Glucose control: SSI Mobility: bed Code Status: full Family Communication: Sister 10/20 Disposition: ICU  The patient is critically ill with multiple organ systems failure and requires high complexity decision making for assessment and support, frequent evaluation and titration of therapies, application of advanced monitoring technologies and extensive interpretation of multiple databases. Critical Care Time devoted to patient care services described in this note independent of APP/resident  time is 32 minutes.    Kara Mead MD. Shade Flood. [facility]  If no response to pager , please call [phone]   02/27/2019

## 2019-02-27 NOTE — Progress Notes (Signed)
eLink Physician-Brief Progress Note Patient Name: Judith [name] DOB: 1950-06-16 MRN: [id number]   Date of Service  02/27/2019  HPI/Events of Note  Ventilator asynchrony  eICU Interventions  Will order: 1. Versed IV infusion. Titrate to RASS = 0 to -1.      Intervention Category Major Interventions: Other:;Respiratory failure - evaluation and management  Sommer,Steven Eugene 02/27/2019, 1:26 AM

## 2019-02-27 NOTE — Progress Notes (Signed)
Pt became very asynchronous with vent after being bathed, similarly to the night prior. Fentanyl was give at max dose 200 mcg and versed was given at max dose 8 mg. Ventilator changes were made per CCM to aid with vent tolerance. Pt remained asynchronous and was then given 8.6 mg vecuronium injection. Medication provided desired outcome and pt is now tolerating ventilator well. Levophed was restarted dt MAPs in 50's and titrated up to 7. Versed down to 2 mg and fentanyl down to 100 mcg. Will obtain ABG at 0330.

## 2019-02-27 NOTE — Progress Notes (Addendum)
Progress Note  Patient Name: Judith [name] Date of Encounter: 02/27/2019  Primary Cardiologist: Fransico Him, MD   Subjective   Intubated, off sedation, wakes to verbal and touch, though does not follow commands  Inpatient Medications    Scheduled Meds: . atorvastatin  20 mg Per Tube Daily  . budesonide (PULMICORT) nebulizer solution  0.25 mg Nebulization BID  . buPROPion  75 mg Per Tube BID  . chlorhexidine gluconate (MEDLINE KIT)  15 mL Mouth Rinse BID  . Chlorhexidine Gluconate Cloth  6 each Topical Daily  . famotidine  20 mg Per Tube Daily  . ferrous sulfate  300 mg Per Tube Daily  . FLUoxetine  20 mg Per Tube QHS  . furosemide  80 mg Intravenous BID  . insulin aspart  3-9 Units Subcutaneous Q4H  . insulin detemir  15 Units Subcutaneous BID  . loratadine  10 mg Per Tube QHS  . magnesium oxide  400 mg Per Tube QHS  . mouth rinse  15 mL Mouth Rinse 10 times per day  . methimazole  10 mg Per Tube BID  . sodium chloride flush  10-40 mL Intracatheter Q12H  . Thrombi-Pad  1 each Topical Once   Continuous Infusions: . sodium chloride Stopped (02/25/19 2221)  . amiodarone 30 mg/hr (02/27/19 1200)  . DOBUTamine 5 mcg/kg/min (02/27/19 1200)  . feeding supplement (VITAL HIGH PROTEIN) 60 mL/hr at 02/27/19 1200  . fentaNYL infusion INTRAVENOUS Stopped (02/27/19 0946)  . heparin 1,050 Units/hr (02/27/19 1200)  . norepinephrine (LEVOPHED) Adult infusion Stopped (02/27/19 1041)   PRN Meds: sodium chloride, acetaminophen, albuterol, bisacodyl, docusate, fentaNYL, fentaNYL (SUBLIMAZE) injection, Melatonin, midazolam, midazolam, nitroGLYCERIN, ondansetron (ZOFRAN) IV, polyethylene glycol, sodium chloride flush, triamcinolone   Vital Signs    Vitals:   02/27/19 1124 02/27/19 1130 02/27/19 1145 02/27/19 1200  BP:    101/81  Pulse:  (!) 28 (!) 113 (!) 107  Resp:  _0 Temp: 98.6 F (37 C)     TempSrc: Oral     SpO2:  (!) 87% 98% 97%  Weight:      Height:         Intake/Output Summary (Last 24 hours) at 02/27/2019 1209 Last data filed at 02/27/2019 1200 Gross per 24 hour  Intake 2632.09 ml  Output 1350 ml  Net 1282.09 ml   Last 3 Weights 02/27/2019 02/26/2019 02/25/2019  Weight (lbs) 186 lb 4.6 oz 188 lb 15 oz 188 lb 15 oz  Weight (kg) 84.5 kg 85.7 kg 85.7 kg      Telemetry    SR >> afib this am 90'S-110s- Personally Reviewed  ECG    No new EKGs - Personally Reviewed  Physical Exam   GEN: wakes to verbal, does not follow commands, remains intibate Neck: ++ JVD Cardiac: irreg-irreg, 1-2/6 SM, no rubs, or gallops.  Respiratory: scattered rhonchi b/l. GI: Soft,  non-distended  MS: No edema left, R BKA. Neuro:  wakes, opens eys to verbal, does not follow commands  Psych: unable to assess   Labs    High Sensitivity Troponin:   Recent Labs  Lab 02/08/2019 1459 02/21/2019 1644 02/13/2019 1738 03/08/2019 1954  TROPONINIHS 375* 197* 476* 548*      Chemistry Recent Labs  Lab 02/25/19 0250 02/25/19 1735 02/26/19 0308 02/27/19 0322 02/27/19 0353 02/27/19 0501  NA 130* 130* 132* 132* 130* 131*  K 4.3 4.3 4.8 4.0 4.0 3.9  CL 89* 90* 91*  --  89*  --  CO2 _0 --  27  --   GLUCOSE 231* 155* 137*  --  264*  --   BUN 44* 53* 61*  --  80*  --   CREATININE 2.12* 2.04* 2.49*  --  2.65*  --   CALCIUM 8.5* 8.7* 8.5*  --  8.5*  --   PROT 6.1*  --  5.9*  --  5.7*  --   ALBUMIN 2.9*  --  2.6*  --  2.6*  --   AST 3,843*  --  1,969*  --  675*  --   ALT 1,306*  --  1,269*  --  832*  --   ALKPHOS 151*  --  142*  --  130*  --   BILITOT 1.7*  --  1.4*  --  1.5*  --   GFRNONAA 24* 25* 19*  --  18*  --   GFRAA 27* 29* 23*  --  21*  --   ANIONGAP _1 --  14  --      Hematology Recent Labs  Lab 02/25/19 0250 02/26/19 0308 02/27/19 0322 02/27/19 0353 02/27/19 0501  WBC 14.3* 14.7*  --  12.3*  --   RBC 3.90 3.91  --  3.81*  --   HGB 10.4* 10.5* 11.6* 9.9* 10.9*  HCT 33.1* 33.5* 34.0* 32.9* 32.0*  MCV 84.9 85.7  --  86.4  --    MCH 26.7 26.9  --  26.0  --   MCHC 31.4 31.3  --  30.1  --   RDW 16.1* 16.6*  --  16.7*  --   PLT 321 387  --  391  --     BNP Recent Labs  Lab 03/09/2019 1459  BNP 1,119.0*     DDimer No results for input(s): DDIMER in the last 168 hours.   Radiology    Dg Chest Port 1 View Result Date: 02/27/2019 CLINICAL DATA:  Intubation.  Respiratory failure. EXAM: PORTABLE CHEST 1 VIEW COMPARISON:  Chest x-ray 02/25/2019. FINDINGS: Endotracheal tube tip 2 cm above the carina on today's exam. NG tube, right IJ line stable position. Stable cardiomegaly. Mild pulmonary venous congestion on today's exam. Mild increase interstitial markings noted bilaterally. Small bilateral pleural effusions. These findings suggest CHF. Pneumonitis cannot be excluded. No pneumothorax. IMPRESSION: 1. Endotracheal tube tip 2 cm above the carina on today's exam. NG tube and right IJ line in stable position. 2. Stable cardiomegaly. Mild pulmonary venous congestion noted on today's exam. Mild increase interstitial markings noted bilaterally. Small bilateral pleural effusions noted. These findings suggest CHF. Electronically Signed   By: Marcello Moores  Register   On: 02/27/2019 07:04     Cardiac Studies    02/17/2019: TTE IMPRESSIONS  1. Left ventricular ejection fraction, by visual estimation, is 30 to 35%. The left ventricle has moderate to severely decreased function. Normal left ventricular size. There is mildly increased left ventricular hypertrophy.  2. Wall motion anlysis was limited (parasternal and apical-4-chamber views only), but there is global LV hypokinesis with severe inferosptal hypokinesis.  3. Left ventricular diastolic function could not be evaluated due to atrial fibrillation.  4. Left atrial size was severely dilated.  5. Right atrial size was moderately dilated.  6. Moderate to severe mitral annular calcification.  7. The mitral valve is degenerative. Moderate mitral valve regurgitation.  8. The  tricuspid valve is grossly normal. Tricuspid valve regurgitation mild-moderate.  9. The aortic valve is tricuspid. Mild to moderate aortic valve sclerosis/calcification.  There is no aortic insufficiency. 10. Gradients underestimate the degree of aortic stenosis due to low stroke volume. Overall impression is of moderate aortic stenosis. 11. The right ventricle has mildly reduced global systolic function.The right ventricular size is normal. No increase in right ventricular wall thickness. 12. Compared to January 2020, left ventricular systolic function has deteriorated globally, but there also appears to be new inferoseptal severe hypokinesis, disproportionate to the remainder of the LV. Mitral regurgitation has worsened and aortic valve  gradients are lower due to poor cardiac output.  05/16/2018 TTE, LVEF 60-65%   Patient Profile     68 y.o. 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), CKD (III), and persistent AFib Admitted to [facility] 2/2 increasing palpitations, CP,  and SOB after being started on a steroid dose pack for rash Found in rapid AFib placed on IV amio >> atypical Aflutter w/RVR aberrantly conducted Worsening CHF and decreased EF in this setting Underwent urgent DCCV 02/21/2019 with what was felt impending shock 2/2 this was successful in restoring SR   AAD Hx Sotalol failed it seems with recurrent AFib Jan 2020 ER visit with [facility] >> suspect to have been aberrantly conducted AFib w/RVR, less likely VT though could not r/o Amiodarone is current (looks was started Jan 2020))  Assessment & Plan    1. Persistent AFib     CHA2DS2Vasc is 5  Not felt to have any other AAD options Dr. Rayann Heman was not optimistic about AFib ablation and long term success gioven LA enlargement and hyperthyroidism  amo gtt started 02/08/2019 > remains at 24m/hr On heparin gtt, (off  Eliquis with high LFTs, shock liver, elevated INR)  She is back in AFib today (since mid-morning), rates generally 90's-100's Pt's hyperthyroidism pre-dates the amiodarone, and by CCM note felt to be in a storm I will re-bolus her amiodarone and increase gtt rate back to 632mhr afterwards hopefully will put her back in SR  Dr. AlJackalyn Lombardhoughts by his prior notes were as last resort pace ablate....  He will see her later today  2. Acute CHF exacerbation associated with shock, MSOF AKI    BUN/Creat continue to rise Liver failure    LFTs trending down Respiratory failue     Required ETT, remains intubated     AHF consulted to case 02/25/2019  Remains on dobutamine gtt _0  Off levophed this AM back on at 1 this afternoon   3. CAD     Planned for eventual cath once clinically able   4. Hyperthyriodsm     CCM note reviewed, not felt to be thyroid storm, off steroids, on methimazole (was stopped outpatient)      For questions or updates, please contact [facility] consult [email or web address] for contact info under        Signed, ReBaldwin JamaicaPA-C  02/27/2019, 12:09 PM      I have seen, examined the patient, and reviewed the above assessment and plan.  Changes to above are made where necessary.  On exam, very ill appearing,  Not interactive,  iRRR.    Her prognosis is very poor.  Unfortunately, she is back in afib today.  Continue IV amiodarone with bolus given this afternoon.  She is too sick currently for EP procedure.   Co Sign: JaThompson GrayerMD 02/27/2019 9:08 PM

## 2019-02-28 DIAGNOSIS — J811 Chronic pulmonary edema: Secondary | ICD-10-CM

## 2019-02-28 LAB — COMPREHENSIVE METABOLIC PANEL
ALT: 556 U/L — ABNORMAL HIGH (ref 0–44)
AST: 220 U/L — ABNORMAL HIGH (ref 15–41)
Albumin: 2.4 g/dL — ABNORMAL LOW (ref 3.5–5.0)
Alkaline Phosphatase: 131 U/L — ABNORMAL HIGH (ref 38–126)
Anion gap: 13 (ref 5–15)
BUN: 96 mg/dL — ABNORMAL HIGH (ref 8–23)
CO2: 27 mmol/L (ref 22–32)
Calcium: 8.5 mg/dL — ABNORMAL LOW (ref 8.9–10.3)
Chloride: 90 mmol/L — ABNORMAL LOW (ref 98–111)
Creatinine, Ser: 2.32 mg/dL — ABNORMAL HIGH (ref 0.44–1.00)
GFR calc Af Amer: 25 mL/min — ABNORMAL LOW (ref >60)
GFR calc non Af Amer: 21 mL/min — ABNORMAL LOW (ref >60)
Glucose, Bld: 241 mg/dL — ABNORMAL HIGH (ref 70–99)
Potassium: 3.8 mmol/L (ref 3.5–5.1)
Sodium: 130 mmol/L — ABNORMAL LOW (ref 135–145)
Total Bilirubin: 0.9 mg/dL (ref 0.3–1.2)
Total Protein: 5.6 g/dL — ABNORMAL LOW (ref 6.5–8.1)

## 2019-02-28 LAB — POCT I-STAT 7, (LYTES, BLD GAS, ICA,H+H)
Acid-Base Excess: 6 mmol/L — ABNORMAL HIGH (ref 0.0–2.0)
Bicarbonate: 31.8 mmol/L — ABNORMAL HIGH (ref 20.0–28.0)
Calcium, Ion: 1.19 mmol/L (ref 1.15–1.40)
HCT: 33 % — ABNORMAL LOW (ref 36.0–46.0)
Hemoglobin: 11.2 g/dL — ABNORMAL LOW (ref 12.0–15.0)
O2 Saturation: 92 %
Potassium: 3.7 mmol/L (ref 3.5–5.1)
Sodium: 130 mmol/L — ABNORMAL LOW (ref 135–145)
TCO2: 33 mmol/L — ABNORMAL HIGH (ref 22–32)
pCO2 arterial: 49.9 mmHg — ABNORMAL HIGH (ref 32.0–48.0)
pH, Arterial: 7.412 (ref 7.350–7.450)
pO2, Arterial: 65 mmHg — ABNORMAL LOW (ref 83.0–108.0)

## 2019-02-28 LAB — COOXEMETRY PANEL
Carboxyhemoglobin: 1.5 % (ref 0.5–1.5)
Carboxyhemoglobin: 1.4 % (ref 0.5–1.5)
Methemoglobin: 0.5 % (ref 0.0–1.5)
Methemoglobin: 0.8 % (ref 0.0–1.5)
O2 Saturation: 72.9 %
O2 Saturation: 61.3 %
Total hemoglobin: 9.8 g/dL — ABNORMAL LOW (ref 12.0–16.0)
Total hemoglobin: 9.9 g/dL — ABNORMAL LOW (ref 12.0–16.0)

## 2019-02-28 LAB — GLUCOSE, CAPILLARY
Glucose-Capillary: 229 mg/dL — ABNORMAL HIGH (ref 70–99)
Glucose-Capillary: 227 mg/dL — ABNORMAL HIGH (ref 70–99)
Glucose-Capillary: 238 mg/dL — ABNORMAL HIGH (ref 70–99)
Glucose-Capillary: 240 mg/dL — ABNORMAL HIGH (ref 70–99)
Glucose-Capillary: 212 mg/dL — ABNORMAL HIGH (ref 70–99)
Glucose-Capillary: 136 mg/dL — ABNORMAL HIGH (ref 70–99)
Glucose-Capillary: 189 mg/dL — ABNORMAL HIGH (ref 70–99)
Glucose-Capillary: 197 mg/dL — ABNORMAL HIGH (ref 70–99)
Glucose-Capillary: 204 mg/dL — ABNORMAL HIGH (ref 70–99)
Glucose-Capillary: 260 mg/dL — ABNORMAL HIGH (ref 70–99)
Glucose-Capillary: 255 mg/dL — ABNORMAL HIGH (ref 70–99)

## 2019-02-28 LAB — APTT: aPTT: 70 seconds — ABNORMAL HIGH (ref 24–36)

## 2019-02-28 LAB — CBC
HCT: 31.6 % — ABNORMAL LOW (ref 36.0–46.0)
Hemoglobin: 9.8 g/dL — ABNORMAL LOW (ref 12.0–15.0)
MCH: 26.6 pg (ref 26.0–34.0)
MCHC: 31 g/dL (ref 30.0–36.0)
MCV: 85.6 fL (ref 80.0–100.0)
Platelets: 337 10*3/uL (ref 150–400)
RBC: 3.69 MIL/uL — ABNORMAL LOW (ref 3.87–5.11)
RDW: 16.9 % — ABNORMAL HIGH (ref 11.5–15.5)
WBC: 11.6 10*3/uL — ABNORMAL HIGH (ref 4.0–10.5)
nRBC: 0 % (ref 0.0–0.2)

## 2019-02-28 MED ORDER — INSULIN REGULAR(HUMAN) IN NACL 100-0.9 UT/100ML-% IV SOLN
INTRAVENOUS | Status: DC
  Administered 2019-02-28: 1.7 [IU]/h via INTRAVENOUS
  Filled 2019-02-28: qty 100

## 2019-02-28 MED ORDER — INSULIN DETEMIR 100 UNIT/ML ~~LOC~~ SOLN: 5.0000 [IU] | Freq: Two times a day (BID) | SUBCUTANEOUS | Status: DC

## 2019-02-28 MED ORDER — INSULIN ASPART 100 UNIT/ML ~~LOC~~ SOLN
3.0000 [IU] | SUBCUTANEOUS | Status: DC
  Administered 2019-02-28: 9 [IU] via SUBCUTANEOUS
  Administered 2019-02-28: 6 [IU] via SUBCUTANEOUS
  Administered 2019-02-28: 9 [IU] via SUBCUTANEOUS

## 2019-02-28 MED ORDER — FUROSEMIDE 10 MG/ML IJ SOLN
12.0000 mg/h | INTRAVENOUS | Status: DC
  Administered 2019-02-28 – 2019-03-04 (×5): 12 mg/h via INTRAVENOUS
  Filled 2019-02-28: qty 25
  Filled 2019-02-28: qty 20
  Filled 2019-02-28: qty 25
  Filled 2019-02-28: qty 21
  Filled 2019-02-28 (×2): qty 25

## 2019-02-28 MED ORDER — FUROSEMIDE 10 MG/ML IJ SOLN
80.0000 mg | Freq: Once | INTRAMUSCULAR | Status: AC
  Administered 2019-02-28: 80 mg via INTRAVENOUS
  Filled 2019-02-28: qty 8

## 2019-02-28 NOTE — Progress Notes (Signed)
Patient ID: Judith [name], female   DOB: 11/26/1950, 68 y.o.   MRN: [id number]     Advanced Heart Failure Rounding Note  PCP-Cardiologist: Fransico Him, MD   Subjective:    Patient went back into atrial fibrillation with RVR yesterday afternoon, amiodarone increased to 60 mg/hr.  HR 100s overnight, now up to 120s with PS trial the morning (tachypneic).   Remains on dobutamine 73mg/kg/min but off norepinephrine.  MAP has been stable, co-ox 73%.  CVP 18-20 this morning.  Some UOP yesterday (1895), but I still >> O.  Creatinine lower at 2.32 but BUN higher.    Objective:   Weight Range: 84.4 kg Body mass index is 31.94 kg/m.   Vital Signs:   Temp:  [98 F (36.7 C)-99.1 F (37.3 C)] 98.6 F (37 C) (10/21 0300) Pulse Rate:  [28-123] 110 (10/21 0736) Resp:  [10-23] 13 (10/21 0736) BP: (88-119)/(47-93) 113/75 (10/21 0600) SpO2:  [87 %-100 %] 92 % (10/21 0736) Arterial Line BP: (100-145)/(48-72) 145/70 (10/21 0736) FiO2 (%):  [40 %] 40 % (10/21 0736) Weight:  [84.4 kg] 84.4 kg (10/21 0500) Last BM Date: 02/21/19  Weight change: Filed Weights   02/26/19 0500 02/27/19 0500 02/28/19 0500  Weight: 85.7 kg 84.5 kg 84.4 kg    Intake/Output:   Intake/Output Summary (Last 24 hours) at 02/28/2019 0808 Last data filed at 02/28/2019 0738 Gross per 24 hour  Intake 2496.24 ml  Output 1935 ml  Net 561.24 ml      Physical Exam   General: Intubated, sedated.  Neck: JVP 14+ cm, no thyromegaly or thyroid nodule.  Lungs: Dependent crackles.  CV: Nondisplaced PMI.  Heart mildly tachy, irregular S1/S2, no S3/S4, 1/6 SEM RUSB.  No peripheral edema.    Abdomen: Soft, nontender, no hepatosplenomegaly, no distention.  Skin: Intact without lesions or rashes.  Neurologic: Responds to stimuli but not purposefully.  Extremities: No clubbing or cyanosis.  HEENT: Normal.    Telemetry   Atrial fibrillation with RVR (personally reviewed)   Labs    CBC Recent Labs    02/27/19 0353  02/27/19 0501 02/28/19 0455  WBC 12.3*  --  11.6*  HGB 9.9* 10.9* 9.8*  HCT 32.9* 32.0* 31.6*  MCV 86.4  --  85.6  PLT 391  --  3155  Basic Metabolic Panel Recent Labs    02/27/19 0353 02/27/19 0501 02/28/19 0455  NA 130* 131* 130*  K 4.0 3.9 3.8  CL 89*  --  90*  CO2 27  --  27  GLUCOSE 264*  --  241*  BUN 80*  --  96*  CREATININE 2.65*  --  2.32*  CALCIUM 8.5*  --  8.5*  MG 2.3  --   --    Liver Function Tests Recent Labs    02/27/19 0353 02/28/19 0455  AST 675* 220*  ALT 832* 556*  ALKPHOS 130* 131*  BILITOT 1.5* 0.9  PROT 5.7* 5.6*  ALBUMIN 2.6* 2.4*   No results for input(s): LIPASE, AMYLASE in the last 72 hours. Cardiac Enzymes No results for input(s): CKTOTAL, CKMB, CKMBINDEX, TROPONINI in the last 72 hours.  BNP: BNP (last 3 results) Recent Labs    03/03/2019 1459  BNP 1,119.0*    ProBNP (last 3 results) Recent Labs    10/13/18 1057 01/18/19 1445  PROBNP 2,908* 2,645*     D-Dimer No results for input(s): DDIMER in the last 72 hours. Hemoglobin A1C No results for input(s): HGBA1C in the last 72 hours.  Fasting Lipid Panel No results for input(s): CHOL, HDL, LDLCALC, TRIG, CHOLHDL, LDLDIRECT in the last 72 hours. Thyroid Function Tests No results for input(s): TSH, T4TOTAL, T3FREE, THYROIDAB in the last 72 hours.  Invalid input(s): FREET3  Other results:   Imaging    No results found.   Medications:     Scheduled Medications: . atorvastatin  20 mg Per Tube Daily  . budesonide (PULMICORT) nebulizer solution  0.25 mg Nebulization BID  . buPROPion  75 mg Per Tube BID  . chlorhexidine gluconate (MEDLINE KIT)  15 mL Mouth Rinse BID  . Chlorhexidine Gluconate Cloth  6 each Topical Daily  . famotidine  20 mg Per Tube Daily  . ferrous sulfate  300 mg Per Tube Daily  . FLUoxetine  20 mg Per Tube QHS  . furosemide  80 mg Intravenous Once  . insulin detemir  15 Units Subcutaneous BID  . loratadine  10 mg Per Tube QHS  . magnesium  oxide  400 mg Per Tube QHS  . mouth rinse  15 mL Mouth Rinse 10 times per day  . methimazole  10 mg Per Tube BID  . sodium chloride flush  10-40 mL Intracatheter Q12H  . Thrombi-Pad  1 each Topical Once    Infusions: . sodium chloride Stopped (02/25/19 2221)  . amiodarone 60 mg/hr (02/28/19 0600)  . DOBUTamine 5 mcg/kg/min (02/28/19 0600)  . feeding supplement (VITAL HIGH PROTEIN) 60 mL/hr at 02/28/19 0738  . fentaNYL infusion INTRAVENOUS Stopped (02/27/19 0946)  . furosemide (LASIX) infusion    . heparin 1,050 Units/hr (02/28/19 0600)  . insulin 1.7 mL/hr at 02/28/19 0600  . norepinephrine (LEVOPHED) Adult infusion Stopped (02/27/19 1321)    PRN Medications: sodium chloride, acetaminophen, albuterol, bisacodyl, docusate, fentaNYL, fentaNYL (SUBLIMAZE) injection, Melatonin, midazolam, midazolam, nitroGLYCERIN, ondansetron (ZOFRAN) IV, polyethylene glycol, sodium chloride flush, triamcinolone   Assessment/Plan   1. Acute on chronic systolic CHF/cardiogenic shock: Echo this admission with new fall in EF, 30-35% with diffuse hypokinesis worse in the inferoseptal wall, severe LAE. Possible tachy-mediated CMP as she was in rapid atypical atrial flutter rate 150s at admission.  She converted to NSR but now is back in atrial fibrillation.  Rate controlled overnight but now with RVR in setting of PS trial. Co-ox 72% this morning with CVP at 18-20, some UOP but not vigorous.  Creatinine lower but BUN higher. She remains on dobutamine 5 but is off norepinephrine.  - With afib/RVR and good co-ox, will decrease dobutamine to 3 today.   - Lasix 80 mg IV x 1 then Lasix gtt at 12 mg/hr.  - Would ideally have workup for coronary disease as cause of cardiomyopathy (has known prior CAD), but would hold off until creatinine comes down.  2. Atypical atrial flutter/atrial fibrillation: Patient has had both atrial fibrillation and atypical flutter.  Admitted with atypical flutter/RVR in 150s.  She had  emergent DCCV initially and remained in NSR for several days but back in persistent afib 10/20.  She has severe LAE and moderate RAE. She has been seen by Dr. Rayann Heman.  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 She is not a good ablation candidate given the size of the atria though could consider if she holds NSR for a month or two.  Rate is higher this morning with PS trial and tachypnea.  - Would put her back on full vent support.  - Continue amiodarone  gtt at 60 mg/hr and will decrease dobutamine as above.  She may end up needing repeat DCCV though currently hemodynamics are ok. Will see if she converts on her own with decreased dobutamine and increased amiodarone.  - Eventual option may be AV nodal ablation/BiV pacing.  - Continue heparin gtt (stopped apixaban).   3. Liver failure: Markedly elevated transaminases.  Suspect shock liver from hypotension/shock.  LFTs now trending down.  - Maintain MAP and CO, should improve over time.  - Stop statin for now.  4. CAD: Known CTO RCA, prior DES to LCx/OM in 2016.  Peak hs-TnI 548.  Most likely demand ischemia from shock.  - Holding statin with liver failure, restart as LFTs trend down.  - As above, would consider coronary angiography once creatinine comes down.  5. Hyperthyroidism: Thyrotoxicosis likely helps drive the atrial fibrillation/flutter.  This may have been triggered by chronic amiodarone use.  Unfortunately, she does not have a lot of options right now and will need to continue amiodarone.  - She is now off methimazole.  - Continue methimazole.  6. AKI: On CKD stage 3.  Suspect due to shock.  Hopefully will improve with supportive care. Creatinine lower today but BUN higher.  Follow UOP and BMET.  7. Aortic stenosis: Probably moderate.  8. Acute hypoxemic respiratory failure: Suspected pulmonary edema, but based on behavior  on vent, ?underlying COPD.  She is a prior smoker.  No fever, no definite PNA on CXR.  - Per CCM.  - Not tolerating PS trial this morning likely due to pulmonary edema, will try to diurese and will put back on full vent.   CRITICAL CARE Performed by: Loralie Champagne  Total critical care time: 40 minutes  Critical care time was exclusive of separately billable procedures and treating other patients.  Critical care was necessary to treat or prevent imminent or life-threatening deterioration.  Critical care was time spent personally by me on the following activities: development of treatment plan with patient and/or surrogate as well as nursing, discussions with consultants, evaluation of patient's response to treatment, examination of patient, obtaining history from patient or surrogate, ordering and performing treatments and interventions, ordering and review of laboratory studies, ordering and review of radiographic studies, pulse oximetry and re-evaluation of patient's condition.   Length of Stay: [place], MD  02/28/2019, 8:08 AM  Advanced Heart Failure Team Pager [phone] (M-F; 7a - 4p)  Please contact [facility] for night-coverage after hours (4p -7a ) and weekends on [email or web address]

## 2019-02-28 NOTE — Progress Notes (Signed)
eLink Physician-Brief Progress Note Patient Name: Judith [name] DOB: 15-Oct-1950 MRN: [id number]   Date of Service  02/28/2019  HPI/Events of Note  Blood sugar is 260 mg %  eICU Interventions  RN instructed to give Levemir dose of 9 units scheduled for 10 pm early (8:30 pm) and check next blood sugar in 2 hours.        Kerry Kass Livy Ross 02/28/2019, 8:59 PM

## 2019-02-28 NOTE — Progress Notes (Signed)
[place] for Heparin  Indication: atrial fibrillation  Allergies  Allergen Reactions  . Contrast Media [Iodinated Diagnostic Agents] Hives and Other (See Comments)    Spoke to patient, Iodine allergy is really IV contrast allergy.   [name] [Insulin Aspart] Shortness Of Breath and Other (See Comments)    "breathing problems"  . Codeine Nausea And Vomiting and Other (See Comments)    HIGH DOSES-SEVERE VOMITING  . Iodine Other (See Comments)    MUST HAVE BENADRYL PRIOR TO PROCEDURE AND RIGHT BEFORE TREATMENT TO COUNTERACT REACTION-BLISTERING REACTION DERMATOLOGICAL  . Penicillins Itching, Rash and Other (See Comments)    Has patient had a PCN reaction causing immediate rash, facial/tongue/throat swelling, SOB or lightheadedness with hypotension: no Has patient had a PCN reaction causing severe rash involving mucus membranes or skin necrosis: No Has patient had a PCN reaction that required hospitalization No Has patient had a PCN reaction occurring within the last 10 years: No If all of the above answers are "NO", then may proceed with Cephalosporin use.  CHEST SIZED RASH AND ITCHING   . Ace Inhibitors Cough  . Demerol [Meperidine] Nausea And Vomiting  . Dilaudid [Hydromorphone Hcl] Other (See Comments)    HEADACHE   . Neosporin [Neomycin-Bacitracin Zn-Polymyx] Itching, Rash and Other (See Comments)    MAKES REACTIONS WORSE WHEN USING AS PROPHYLACTIC  . Percocet [Oxycodone-Acetaminophen] Rash  . Tape Itching and Rash    Patient Measurements: Height: 5\' 4"  (162.6 cm) Weight: 186 lb 1.1 oz (84.4 kg) IBW/kg (Calculated) : 54.7 Heparin Dosing Weight: 75kg  Vital Signs: Temp: 98.6 F (37 C) (10/21 0300) Temp Source: Oral (10/21 0300) BP: 113/75 (10/21 0600) Pulse Rate: 115 (10/21 0600)  Labs: Recent Labs    02/26/19 0308  02/27/19 0353 02/27/19 0444 02/27/19 0501 02/27/19 1718 02/28/19 0455  HGB 10.5*   < > 9.9*  --  10.9*  --   9.8*  HCT 33.5*   < > 32.9*  --  32.0*  --  31.6*  PLT 387  --  391  --   --   --  337  APTT  --    < > 118* 111*  --  79* 70*  CREATININE 2.49*  --  2.65*  --   --   --  2.32*   < > = values in this interval not displayed.    Estimated Creatinine Clearance: 25.1 mL/min (A) (by C-G formula based on SCr of 2.32 mg/dL (H)).  Assessment: 68yof admitted for SOB in setting of Afib RVR was supposed to have DCCV but deferred because patient missed doses of apixaban prior. She developed hypotension, cardiogenic shock required DCCV on 10/17.  Previously on apixaban last dose 10/17 pm. -aPTT within goal at 70 sec -no heparin level -hgb stable 9.8, pltc wnl  Goal of Therapy:  aPTT aptt 66-90 sec seconds Monitor platelets by anticoagulation protocol: Yes   Plan:  -Continue IV heparin at 1050 units/hr -Heparin level, aPTT and CBC daily -Monitor for bleeding  Vertis Kelch, PharmD PGY2 Cardiology Pharmacy Resident Phone [phone] 02/28/2019       7:08 AM  Please check [email or web address] for unit-specific pharmacist phone numbers

## 2019-02-28 NOTE — Progress Notes (Signed)
eLink Physician-Brief Progress Note Patient Name: Judith [name] DOB: Jan 16, 1951 MRN: [id number]   Date of Service  02/28/2019  HPI/Events of Note  Pt blood sugar persistently over 200 mg % despite CBG and SS insulin coverage.  eICU Interventions  Phase 2 hyperglycemic insulin admistration protocol ordered.        Kerry Kass [name] 02/28/2019, 5:17 AM

## 2019-02-28 NOTE — Progress Notes (Signed)
NAME:  Judith [name], MRN:  [id number], DOB:  01/31/1951, LOS: 5 ADMISSION DATE:  03/06/2019, CONSULTATION DATE:  02/11/2019 REFERRING MD:  Dr. Rayann Heman, CHIEF COMPLAINT:  Resp. Fail.   Brief History   68 y/o with multiple comorbidities, including paroxysmal afib. Admitted in afib w/ RVR with what appears to be developing CHF. Admitted 10/16, became more SOB 10/17, still in AFib despite amiodarone and dilt. Taken for cardioversion but became more hypotensive,  requiring pressor support, intubated. Called for vent management; assistance with pressors   Past Medical History  1) paroxysmal afib 2) CHF 3) COPD(?) 4) Hyperthyroidism 5) IDDM 6) CKD stage III Significant Hospital Events   10/17 cardioversion 10/17 intubation       10/19 Severe vent asynchrony overnight,  Vecuronium x1 on 2 consecutive nights, changed to pressure control 10/20 titrating pressors and inotrope support.  Had been on bicarb gtt this was stopped.  10/21 Hyperglycemic overnight; insulin gtt started. Off norepinephrine, Co-ox 73% on dobutamine. [place] a little better. Back in AF. Decreasing dobutamine to 3. Lasix 80mg   f/b gtt started. Failed SBT attempt.  Consults:  Cardiology PCCM  Procedures:  10/17--aline 10/17 ETT >> 10/17 RIJ central line >> 10/17 cardioversion  Significant Diagnostic Tests:  10/18: ULTRASOUND ABDOMEN LIMITED RIGHT UPPER QUADRANT 1. Mild gallbladder wall thickening up to 7 mm, which could be related to incomplete distension. No other sonographic features for acute cholecystitis. 2. Previously seen cholelithiasis not visualized on today's exam. 3. No biliary dilatation or other acute finding.   10/17 TTE: EF  30 to 35%.  global LV hypokinesis with severe inferosptal hypokinesis.  mod AS, mod MR    Micro Data:  COVID neg HIV neg MRSA neg Antimicrobials:  none   Interim history/subjective:  Currently on full ventilator support following feeling attempts at weaning   Objective    Blood pressure 117/77, pulse (Abnormal) 112, temperature 98.6 F (37 C), temperature source Oral, resp. rate (Abnormal) 9, height 5\' 4"  (1.626 m), weight 84.4 kg, SpO2 96 %. CVP:  [9 mmHg-22 mmHg] 22 mmHg  Vent Mode: CPAP;PSV FiO2 (%):  [40 %] 40 % Set Rate:  [8 bmp-15 bmp] 8 bmp Vt Set:  [540 mL] 540 mL PEEP:  [5 cmH20] 5 cmH20 Pressure Support:  [8 cmH20-15 cmH20] 8 cmH20 Plateau Pressure:  [18 cmH20-29 cmH20] 27 cmH20   Intake/Output Summary (Last 24 hours) at 02/28/2019 1047 Last data filed at 02/28/2019 1000 Gross per 24 hour  Intake 2629.76 ml  Output 2045 ml  Net 584.76 ml   Filed Weights   02/26/19 0500 02/27/19 0500 02/28/19 0500  Weight: 85.7 kg 84.5 kg 84.4 kg    Examination: General this is a 68 year old chronically ill-appearing female she appears much older than stated age she is currently on full ventilatory support HEENT normocephalic atraumatic no clear jugular venous distention mucous membranes are moist Pulmonary: Diffuse rales no accessory use Cardiac: Regular irregular with atrial fibrillation on telemetry.  There is a soft systolic murmur audible best over left sternal border Abdomen soft nontender Extremities warm and dry she has a right BKA Neuro sedated, moves extremities.  Not following commands currently  Resolved Hospital Problem list   Cardiogenic shock Metabolic acidosis Assessment & Plan:  Multiple comorbidities including Afib, cardioverted with subsequent cardiogenic shock and acute respiratory failure   Acute respiratory failure in setting of pulmonary edema and cardiogenic shock Severe vent asynchrony requiring paralytic 10/19 & 10/20, currently on pressure control ventilation with variable tidal volumes Portable  chest x-ray reviewed from 10/21 demonstrated endotracheal tube in satisfactory position there is bilateral pulmonary edema And small basilar effusions -She failed spontaneous breathing trial this morning Plan Continuing full  ventilator support, have made adjustments to pressure control ventilation as with pressure control of 25.  Tidal volumes were in excess of 1.2 L Agree with IV diuresis Repeat arterial blood gas following ventilator changes PAD protocol with RASS goal of -1 VAP bundle A.m. chest x-ray Okay to repeat spontaneous breathing trial in a.m.   Acute systolic heart failure Plan IV Lasix drip for preload reduction Deferring inotrope titration to the heart failure team  Atrial fibrillation with RVR-status post cardioversion Plan Continuing amiodarone and heparin per cardiology service  AKI -likely cardiorenal plus diuretic induced, underlying CKD stage III, Serum creatinine slightly improved Plan Strict intake output Serial chemistries  Fluid and electrolyte imbalance: Mild hyponatremia-diuretic induced Plan Serial chemistries  Shock liver-improving, follow LFTs intermittently Plan Intermittent LFTs  IDDM: Uncontrolled due to steroids  Placed on insulin drip last night now better glycemic control Plan Attempt sliding scale insulin  Thyrotoxicosis-for some reason, 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 Plan  Methimazole was resumed 10/19   Summary -intubated for heart failure/new onset LV dysfunction/cardiogenic shock due to thyrotoxicosis and tachyarrhythmias.   Clinically seems better no longer in shock failed weaning trial most likely secondary to ongoing volume overload agree with diuresis.  Best practice:  Diet: NPO Pain/Anxiety/Delirium protocol (if indicated): Fentanyl drip, intermittent Versed VAP protocol (if indicated): in place DVT prophylaxis: heparin, SCDs GI prophylaxis: per protocol Glucose control: SSI Mobility: bed Code Status: full Family Communication: Sister 10/20, I updated her daughter at bedside.  I do think goals of care discussion need to occur further.  I am going to involve palliative care team. Disposition: ICU  My  critical care time is 33 minutes Erick Colace ACNP-BC [place] Pager # [phone] OR # [phone] if no answer    02/28/2019

## 2019-02-28 NOTE — Progress Notes (Signed)
Called to room to assess A-line because the RN was unable to draw blood from it.  Found catheter to be partially out and kinked.  Attempted to re-thread catheter without success so the line was discontinued.

## 2019-03-01 ENCOUNTER — Inpatient Hospital Stay (HOSPITAL_COMMUNITY): Payer: Medicare PPO

## 2019-03-01 DIAGNOSIS — I4891 Unspecified atrial fibrillation: Secondary | ICD-10-CM

## 2019-03-01 DIAGNOSIS — I509 Heart failure, unspecified: Secondary | ICD-10-CM

## 2019-03-01 DIAGNOSIS — I4819 Other persistent atrial fibrillation: Secondary | ICD-10-CM

## 2019-03-01 DIAGNOSIS — J9690 Respiratory failure, unspecified, unspecified whether with hypoxia or hypercapnia: Secondary | ICD-10-CM

## 2019-03-01 DIAGNOSIS — R570 Cardiogenic shock: Secondary | ICD-10-CM

## 2019-03-01 DIAGNOSIS — I5043 Acute on chronic combined systolic (congestive) and diastolic (congestive) heart failure: Secondary | ICD-10-CM

## 2019-03-01 LAB — COMPREHENSIVE METABOLIC PANEL
ALT: 355 U/L — ABNORMAL HIGH (ref 0–44)
AST: 105 U/L — ABNORMAL HIGH (ref 15–41)
Albumin: 2.5 g/dL — ABNORMAL LOW (ref 3.5–5.0)
Alkaline Phosphatase: 138 U/L — ABNORMAL HIGH (ref 38–126)
Anion gap: 13 (ref 5–15)
BUN: 92 mg/dL — ABNORMAL HIGH (ref 8–23)
CO2: 32 mmol/L (ref 22–32)
Calcium: 9.1 mg/dL (ref 8.9–10.3)
Chloride: 87 mmol/L — ABNORMAL LOW (ref 98–111)
Creatinine, Ser: 1.87 mg/dL — ABNORMAL HIGH (ref 0.44–1.00)
GFR calc Af Amer: 32 mL/min — ABNORMAL LOW (ref >60)
GFR calc non Af Amer: 28 mL/min — ABNORMAL LOW (ref >60)
Glucose, Bld: 261 mg/dL — ABNORMAL HIGH (ref 70–99)
Potassium: 3.4 mmol/L — ABNORMAL LOW (ref 3.5–5.1)
Sodium: 132 mmol/L — ABNORMAL LOW (ref 135–145)
Total Bilirubin: 1.1 mg/dL (ref 0.3–1.2)
Total Protein: 6.2 g/dL — ABNORMAL LOW (ref 6.5–8.1)

## 2019-03-01 LAB — COOXEMETRY PANEL
Carboxyhemoglobin: 1.4 % (ref 0.5–1.5)
Methemoglobin: 0.5 % (ref 0.0–1.5)
O2 Saturation: 64.5 %
Total hemoglobin: 10.3 g/dL — ABNORMAL LOW (ref 12.0–16.0)

## 2019-03-01 LAB — GLUCOSE, CAPILLARY
Glucose-Capillary: 109 mg/dL — ABNORMAL HIGH (ref 70–99)
Glucose-Capillary: 120 mg/dL — ABNORMAL HIGH (ref 70–99)
Glucose-Capillary: 163 mg/dL — ABNORMAL HIGH (ref 70–99)
Glucose-Capillary: 164 mg/dL — ABNORMAL HIGH (ref 70–99)
Glucose-Capillary: 167 mg/dL — ABNORMAL HIGH (ref 70–99)
Glucose-Capillary: 185 mg/dL — ABNORMAL HIGH (ref 70–99)
Glucose-Capillary: 217 mg/dL — ABNORMAL HIGH (ref 70–99)
Glucose-Capillary: 254 mg/dL — ABNORMAL HIGH (ref 70–99)
Glucose-Capillary: 269 mg/dL — ABNORMAL HIGH (ref 70–99)
Glucose-Capillary: 277 mg/dL — ABNORMAL HIGH (ref 70–99)
Glucose-Capillary: 289 mg/dL — ABNORMAL HIGH (ref 70–99)
Glucose-Capillary: 79 mg/dL (ref 70–99)
Glucose-Capillary: 80 mg/dL (ref 70–99)
Glucose-Capillary: 82 mg/dL (ref 70–99)
Glucose-Capillary: 92 mg/dL (ref 70–99)
Glucose-Capillary: 94 mg/dL (ref 70–99)
Glucose-Capillary: 95 mg/dL (ref 70–99)

## 2019-03-01 LAB — CBC
HCT: 33 % — ABNORMAL LOW (ref 36.0–46.0)
Hemoglobin: 10.3 g/dL — ABNORMAL LOW (ref 12.0–15.0)
MCH: 26.3 pg (ref 26.0–34.0)
MCHC: 31.2 g/dL (ref 30.0–36.0)
MCV: 84.4 fL (ref 80.0–100.0)
Platelets: 322 10*3/uL (ref 150–400)
RBC: 3.91 MIL/uL (ref 3.87–5.11)
RDW: 17.1 % — ABNORMAL HIGH (ref 11.5–15.5)
WBC: 13 10*3/uL — ABNORMAL HIGH (ref 4.0–10.5)
nRBC: 0 % (ref 0.0–0.2)

## 2019-03-01 LAB — APTT
aPTT: 54 seconds — ABNORMAL HIGH (ref 24–36)
aPTT: 83 seconds — ABNORMAL HIGH (ref 24–36)

## 2019-03-01 LAB — HEPARIN LEVEL (UNFRACTIONATED): Heparin Unfractionated: 1.66 IU/mL — ABNORMAL HIGH (ref 0.30–0.70)

## 2019-03-01 LAB — PROCALCITONIN: Procalcitonin: 0.45 ng/mL

## 2019-03-01 MED ORDER — FENTANYL 2500MCG IN NS 250ML (10MCG/ML) PREMIX INFUSION
25.0000 ug/h | INTRAVENOUS | Status: DC
  Administered 2019-03-01: 25 ug/h via INTRAVENOUS
  Administered 2019-03-02: 150 ug/h via INTRAVENOUS
  Administered 2019-03-03: 125 ug/h via INTRAVENOUS
  Filled 2019-03-01 (×3): qty 250

## 2019-03-01 MED ORDER — DEXTROSE 10 % IV SOLN: INTRAVENOUS | Status: DC | PRN

## 2019-03-01 MED ORDER — ROCURONIUM BROMIDE 50 MG/5ML IV SOLN
50.0000 mg | Freq: Once | INTRAVENOUS | Status: AC
  Administered 2019-03-01: 50 mg via INTRAVENOUS

## 2019-03-01 MED ORDER — FENTANYL BOLUS VIA INFUSION
25.0000 ug | INTRAVENOUS | Status: DC | PRN
  Administered 2019-03-01 – 2019-03-02 (×4): 25 ug via INTRAVENOUS
  Filled 2019-03-01: qty 25

## 2019-03-01 MED ORDER — FENTANYL CITRATE (PF) 100 MCG/2ML IJ SOLN
25.0000 ug | Freq: Once | INTRAMUSCULAR | Status: AC
  Administered 2019-03-01: 25 ug via INTRAVENOUS

## 2019-03-01 MED ORDER — BISACODYL 10 MG RE SUPP
10.0000 mg | Freq: Every day | RECTAL | Status: DC | PRN
  Administered 2019-03-06: 10 mg via RECTAL
  Filled 2019-03-01: qty 1

## 2019-03-01 MED ORDER — FENTANYL CITRATE (PF) 100 MCG/2ML IJ SOLN
100.0000 ug | Freq: Once | INTRAMUSCULAR | Status: AC
  Administered 2019-03-01: 09:00:00 100 ug via INTRAVENOUS

## 2019-03-01 MED ORDER — MIDAZOLAM HCL 2 MG/2ML IJ SOLN
INTRAMUSCULAR | Status: AC
  Administered 2019-03-01: 09:00:00 2 mg via INTRAVENOUS
  Filled 2019-03-01: qty 2

## 2019-03-01 MED ORDER — INSULIN ASPART 100 UNIT/ML ~~LOC~~ SOLN
1.0000 [IU] | SUBCUTANEOUS | Status: DC
  Administered 2019-03-01 – 2019-03-03 (×9): 1 [IU] via SUBCUTANEOUS

## 2019-03-01 MED ORDER — INSULIN ASPART 100 UNIT/ML ~~LOC~~ SOLN
2.0000 [IU] | SUBCUTANEOUS | Status: DC
  Administered 2019-03-01: 4 [IU] via SUBCUTANEOUS
  Administered 2019-03-02: 2 [IU] via SUBCUTANEOUS
  Administered 2019-03-02 (×3): 4 [IU] via SUBCUTANEOUS
  Administered 2019-03-02 – 2019-03-03 (×3): 2 [IU] via SUBCUTANEOUS
  Administered 2019-03-03 (×2): 6 [IU] via SUBCUTANEOUS
  Administered 2019-03-03: 2 [IU] via SUBCUTANEOUS
  Administered 2019-03-03: 4 [IU] via SUBCUTANEOUS

## 2019-03-01 MED ORDER — FENTANYL CITRATE (PF) 100 MCG/2ML IJ SOLN
INTRAMUSCULAR | Status: AC
  Administered 2019-03-01: 100 ug via INTRAVENOUS
  Filled 2019-03-01: qty 2

## 2019-03-01 MED ORDER — MIDAZOLAM HCL 2 MG/2ML IJ SOLN
4.0000 mg | Freq: Once | INTRAMUSCULAR | Status: AC
  Administered 2019-03-01: 4 mg via INTRAVENOUS

## 2019-03-01 MED ORDER — NOREPINEPHRINE 4 MG/250ML-% IV SOLN
INTRAVENOUS | Status: AC
  Filled 2019-03-01: qty 250

## 2019-03-01 MED ORDER — ETOMIDATE 2 MG/ML IV SOLN
20.0000 mg | Freq: Once | INTRAVENOUS | Status: AC
  Administered 2019-03-01: 20 mg via INTRAVENOUS

## 2019-03-01 MED ORDER — POTASSIUM CHLORIDE 10 MEQ/50ML IV SOLN
10.0000 meq | INTRAVENOUS | Status: AC
  Administered 2019-03-01 (×4): 10 meq via INTRAVENOUS
  Filled 2019-03-01 (×4): qty 50

## 2019-03-01 MED ORDER — MIDAZOLAM HCL 2 MG/2ML IJ SOLN
2.0000 mg | Freq: Once | INTRAMUSCULAR | Status: AC
  Administered 2019-03-01: 09:00:00 2 mg via INTRAVENOUS

## 2019-03-01 MED ORDER — INSULIN DETEMIR 100 UNIT/ML ~~LOC~~ SOLN: 15.0000 [IU] | Freq: Two times a day (BID) | SUBCUTANEOUS | Status: DC

## 2019-03-01 MED ORDER — INSULIN REGULAR(HUMAN) IN NACL 100-0.9 UT/100ML-% IV SOLN
INTRAVENOUS | Status: DC
  Administered 2019-03-01: 2.9 [IU]/h via INTRAVENOUS
  Administered 2019-03-01: 2.2 [IU]/h via INTRAVENOUS
  Filled 2019-03-01 (×2): qty 100

## 2019-03-01 MED ORDER — VITAL HIGH PROTEIN PO LIQD
1000.0000 mL | ORAL | Status: DC
  Administered 2019-03-01 – 2019-03-05 (×4): 1000 mL
  Filled 2019-03-01 (×2): qty 1000

## 2019-03-01 MED ORDER — DOCUSATE SODIUM 50 MG/5ML PO LIQD: 100.0000 mg | Freq: Two times a day (BID) | ORAL | Status: DC | PRN

## 2019-03-01 NOTE — Procedures (Addendum)
Electrical Cardioversion Procedure Note EMALEY BILLE [id number] [birth year]  Procedure: Electrical Cardioversion Indications:  Atrial Fibrillation  Procedure Details Consent: Risks of procedure as well as the alternatives and risks of each were explained to the (patient/caregiver).  Consent for procedure obtained. Time Out: Verified patient identification, verified procedure, site/side was marked, verified correct patient position, special equipment/implants available, medications/allergies/relevent history reviewed, required imaging and test results available.  Performed  Patient placed on cardiac monitor, pulse oximetry, supplemental oxygen as necessary.  Sedation given: Versed/Fentanyl Pacer pads placed anterior and posterior chest.  Cardioverted 3 times, the 3rd time with sternal pressure.  Each time, she converted to NSR but then went back into atrial fibrillation with RVR.  The 3rd time, I turned off dobutamine for about 10 minutes prior.  Cardioverted at [place].  Evaluation Findings: Post procedure EKG shows: Atrial Fibrillation Complications: None Patient did tolerate procedure well.  Decrease dobutamine to 1 mcg/kg/min and will try to stop tomorrow.  Re-attempt DCCV off dobutamine and with more amiodarone in her.    Judith [name] 03/01/2019, 1:21 PM

## 2019-03-01 NOTE — Progress Notes (Signed)
Nutrition Follow up  DOCUMENTATION CODES:   Not applicable  INTERVENTION:   Monitor for BM- last documented 10/14  After procedure continue:  Vital High Protein @ 65 ml/hr (1560 ml) via OGT  Provides: 1560 kcals, 137 grams protein, 1304 ml free water.   NUTRITION DIAGNOSIS:   Inadequate oral intake related to inability to eat as evidenced by NPO status.  Ongoing  GOAL:   Patient will meet greater than or equal to 90% of their needs   Addressed via TF  MONITOR:   Diet advancement, Vent status, Skin, TF tolerance, Weight trends, Labs, I & O's  REASON FOR ASSESSMENT:   Consult Enteral/tube feeding initiation and management  ASSESSMENT:   Patient with PMH significant for CHF, COPD, DM, and CKD III. Presents this admission with afib w/ RVR.   10/17- cardioversion, hypotension, intubated 10/22- self extubated, re-intubated   Pt discussed during ICU rounds and with RN.   Requiring pressors. CBG elevated last night, placed on insulin drip. UOP better- continues on lasix drip. TF held for DCCV today. Will continue at increased rate s/p procedure.   Admission weight: 86.2 kg  Current weight: 86.6 kg   Patient remains intubated on ventilator support MV: 8.3 L/min Temp (24hrs), Avg:98.3 F (36.8 C), Min:97.4 F (36.3 C), Max:98.8 F (37.1 C)   I/O: +929 ml since admit UOP: 4,410 ml  x 24 hrs   Drips:  amiodarone, dobutamine, 250 mg lasix in D5 @ 12 ml/hr, insulin  Medications: ferrous sulfate, levemir, MAG-OX Labs: Na 132 (L) K 3.4 (L) LFTs elevated CBG 134-275  NUTRITION - FOCUSED PHYSICAL EXAM:    Most Recent Value  Orbital Region  No depletion  Upper Arm Region  No depletion  Thoracic and Lumbar Region  Unable to assess  Buccal Region  No depletion  Temple Region  Mild depletion  Clavicle Bone Region  Mild depletion  Clavicle and Acromion Bone Region  No depletion  Scapular Bone Region  Unable to assess  Dorsal Hand  No depletion  Patellar Region   No depletion  Anterior Thigh Region  No depletion  Posterior Calf Region  No depletion  Edema (RD Assessment)  Mild  Hair  Reviewed  Eyes  Unable to assess  Mouth  Unable to assess  Skin  Reviewed  Nails  Reviewed     Diet Order:   Diet Order            Diet NPO time specified  Diet effective now              EDUCATION NEEDS:   Not appropriate for education at this time  Skin:  Skin Assessment: Skin Integrity Issues: Skin Integrity Issues:: Other (Comment) Other: MASD-abdomen/perineum  Last BM:  10/14  Height:   Ht Readings from Last 1 Encounters:  02/23/2019 5\' 4"  (1.626 m)    Weight:   Wt Readings from Last 1 Encounters:  03/01/19 86.6 kg    Ideal Body Weight:  54.5 kg  BMI:  Body mass index is 32.77 kg/m.  Estimated Nutritional Needs:   Kcal:  1524 kcal  Protein:  115-130 grams  Fluid:  >/= 1.4 L/day  Mariana Single RD, LDN Clinical Nutrition Pager # - [phone]

## 2019-03-01 NOTE — Progress Notes (Signed)
Patient ID: Judith [name], female   DOB: [birth year], 68 y.o.   MRN: [id number]     Advanced Heart Failure Rounding Note  PCP-Cardiologist: Fransico Him, MD   Subjective:    Patient went back into atrial fibrillation with RVR 10/20, amiodarone increased to 60 mg/hr. HR 120s today.   Remains on dobutamine now down to 3 mcg/kg/min and off norepinephrine.  MAP has been stable, co-ox 64.5%.  CVP 14 this morning.  UOP better yesterday, creatinine lower at 1.87 but BUN higher.   She self-extubated this morning.  Due to respiratory distress, she was re-intubated and is now stable.    Objective:   Weight Range: 86.6 kg Body mass index is 32.77 kg/m.   Vital Signs:   Temp:  [97.4 F (36.3 C)-98.8 F (37.1 C)] 97.4 F (36.3 C) (10/22 0700) Pulse Rate:  [101-128] 124 (10/22 0855) Resp:  [9-29] 18 (10/22 0855) BP: (87-141)/(70-96) 87/70 (10/22 0855) SpO2:  [88 %-96 %] 95 % (10/22 0917) FiO2 (%):  [40 %] 40 % (10/22 0917) Weight:  [86.6 kg] 86.6 kg (10/22 0500) Last BM Date: 02/21/19  Weight change: Filed Weights   02/27/19 0500 02/28/19 0500 03/01/19 0500  Weight: 84.5 kg 84.4 kg 86.6 kg    Intake/Output:   Intake/Output Summary (Last 24 hours) at 03/01/2019 1010 Last data filed at 03/01/2019 0700 Gross per 24 hour  Intake 2548.5 ml  Output 3675 ml  Net -1126.5 ml      Physical Exam   General: NAD, sedated Neck: JVP 12 cm, no thyromegaly or thyroid nodule.  Lungs: Crackles dependently.  CV: Nondisplaced PMI.  Heart tachy, irregular S1/S2, no S3/S4, 2/6 SEM RUSB.  1+ ankle edema.  Abdomen: Soft, nontender, no hepatosplenomegaly, no distention.  Skin: Intact without lesions or rashes.  Neurologic: Was alert this morning per nurse and following commands, now sedated.  Extremities: No clubbing or cyanosis.  HEENT: Normal.    Telemetry   Atrial fibrillation with RVR 120s (personally reviewed)   Labs    CBC Recent Labs    02/28/19 0455 02/28/19 1247 03/01/19  0358  WBC 11.6*  --  13.0*  HGB 9.8* 11.2* 10.3*  HCT 31.6* 33.0* 33.0*  MCV 85.6  --  84.4  PLT 337  --  503   Basic Metabolic Panel Recent Labs    02/27/19 0353  02/28/19 0455 02/28/19 1247 03/01/19 0358  NA 130*   < > 130* 130* 132*  K 4.0   < > 3.8 3.7 3.4*  CL 89*  --  90*  --  87*  CO2 27  --  27  --  32  GLUCOSE 264*  --  241*  --  261*  BUN 80*  --  96*  --  92*  CREATININE 2.65*  --  2.32*  --  1.87*  CALCIUM 8.5*  --  8.5*  --  9.1  MG 2.3  --   --   --   --    < > = values in this interval not displayed.   Liver Function Tests Recent Labs    02/28/19 0455 03/01/19 0358  AST 220* 105*  ALT 556* 355*  ALKPHOS 131* 138*  BILITOT 0.9 1.1  PROT 5.6* 6.2*  ALBUMIN 2.4* 2.5*   No results for input(s): LIPASE, AMYLASE in the last 72 hours. Cardiac Enzymes No results for input(s): CKTOTAL, CKMB, CKMBINDEX, TROPONINI in the last 72 hours.  BNP: BNP (last 3 results) Recent Labs  02/22/2019 1459  BNP 1,119.0*    ProBNP (last 3 results) Recent Labs    10/13/18 1057 01/18/19 1445  PROBNP 2,908* 2,645*     D-Dimer No results for input(s): DDIMER in the last 72 hours. Hemoglobin A1C No results for input(s): HGBA1C in the last 72 hours. Fasting Lipid Panel No results for input(s): CHOL, HDL, LDLCALC, TRIG, CHOLHDL, LDLDIRECT in the last 72 hours. Thyroid Function Tests No results for input(s): TSH, T4TOTAL, T3FREE, THYROIDAB in the last 72 hours.  Invalid input(s): FREET3  Other results:   Imaging    Dg Chest Port 1 View  Result Date: 03/01/2019 CLINICAL DATA:  Check endotracheal tube placement EXAM: PORTABLE CHEST 1 VIEW COMPARISON:  03/01/2019 FINDINGS: Endotracheal tube is again identified 3.5 cm above the carina. Gastric catheter extends into the stomach. Right jugular central line is noted at the cavoatrial junction. The lungs are hypoinflated with diffuse bilateral opacities likely representing edema mildly increased from the prior exam.  No bony abnormality is noted. IMPRESSION: Bilateral parenchymal opacities slightly improved from the prior exam. Tubes and lines as described above in satisfactory position. Electronically Signed   By: Inez Catalina M.D.   On: 03/01/2019 09:17   Dg Chest Port 1 View  Result Date: 03/01/2019 CLINICAL DATA:  Respiratory failure. EXAM: PORTABLE CHEST 1 VIEW COMPARISON:  February 27, 2019. FINDINGS: Stable cardiomegaly. Endotracheal and nasogastric tubes are unchanged in position. Right internal jugular catheter is unchanged. No pneumothorax or pleural effusion is noted. Increased bilateral lung opacities are noted concerning for worsening edema or pneumonia. Bony thorax is unremarkable. IMPRESSION: Stable support apparatus. Increased bilateral lung opacities as described above. Electronically Signed   By: Marijo Conception M.D.   On: 03/01/2019 07:31   Dg Abd Portable 1v  Result Date: 03/01/2019 CLINICAL DATA:  Check gastric catheter placement EXAM: PORTABLE ABDOMEN - 1 VIEW COMPARISON:  02/23/2018 FINDINGS: Gastric catheter is noted extending into the distal stomach. Scattered large and small bowel gas is noted. No free air is seen. IMPRESSION: Gastric catheter within the stomach. Electronically Signed   By: Inez Catalina M.D.   On: 03/01/2019 09:17     Medications:     Scheduled Medications: . atorvastatin  20 mg Per Tube Daily  . budesonide (PULMICORT) nebulizer solution  0.25 mg Nebulization BID  . buPROPion  75 mg Per Tube BID  . chlorhexidine gluconate (MEDLINE KIT)  15 mL Mouth Rinse BID  . Chlorhexidine Gluconate Cloth  6 each Topical Daily  . famotidine  20 mg Per Tube Daily  . ferrous sulfate  300 mg Per Tube Daily  . FLUoxetine  20 mg Per Tube QHS  . insulin detemir  15 Units Subcutaneous BID  . loratadine  10 mg Per Tube QHS  . magnesium oxide  400 mg Per Tube QHS  . mouth rinse  15 mL Mouth Rinse 10 times per day  . methimazole  10 mg Per Tube BID  . sodium chloride flush  10-40  mL Intracatheter Q12H    Infusions: . sodium chloride Stopped (02/25/19 2221)  . amiodarone 60 mg/hr (03/01/19 0700)  . DOBUTamine 2 mcg/kg/min (03/01/19 0914)  . feeding supplement (VITAL HIGH PROTEIN) 60 mL/hr at 02/28/19 1300  . fentaNYL infusion INTRAVENOUS 25 mcg/hr (03/01/19 0926)  . furosemide (LASIX) infusion 12 mg/hr (03/01/19 0700)  . heparin 1,100 Units/hr (03/01/19 0801)  . insulin 2.9 Units/hr (03/01/19 0949)  . norepinephrine    . norepinephrine (LEVOPHED) Adult infusion Stopped (02/27/19 1321)  PRN Medications: sodium chloride, acetaminophen, albuterol, bisacodyl, bisacodyl, docusate, docusate, fentaNYL, fentaNYL, Melatonin, midazolam, midazolam, nitroGLYCERIN, ondansetron (ZOFRAN) IV, polyethylene glycol, sodium chloride flush, triamcinolone   Assessment/Plan   1. Acute on chronic systolic CHF/cardiogenic shock: Echo this admission with new fall in EF, 30-35% with diffuse hypokinesis worse in the inferoseptal wall, severe LAE. Possible tachy-mediated CMP as she was in rapid atypical atrial flutter rate 150s at admission.  She converted to NSR but now is back in atrial fibrillation.  HR in 120s today. Co-ox 64.5% this morning with CVP at 14, UOP picking up.  Creatinine lower but BUN higher. She remains on dobutamine 3 but is off norepinephrine.  - With afib/RVR and adquate co-ox, will decrease dobutamine slowly to 2 today.   - Continue Lasix gtt at 12 mg/hr.  - Would ideally have workup for coronary disease as cause of cardiomyopathy (has known prior CAD), but would hold off until creatinine comes down and stabilizes.  2. Atypical atrial flutter/atrial fibrillation: Patient has had both atrial fibrillation and atypical flutter.  Admitted with atypical flutter/RVR in 150s.  She had emergent DCCV initially and remained in NSR for several days but back in persistent afib 10/20.  She has severe LAE and moderate RAE. She has been seen by Dr. Rayann Heman.  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 She is not a good ablation candidate given the size of the atria though could consider if she holds NSR for a month or two.  Rate is still not controlled.  - Continue amiodarone gtt at 60 mg/hr and will decrease dobutamine as above.  Tube feeds on hold and will plan DCCV later today.  Discussed with family and Dr. Rayann Heman.  - Eventual option may be AV nodal ablation/BiV pacing.  - Continue heparin gtt (stopped apixaban).   3. Liver failure: Markedly elevated transaminases.  Suspect shock liver from hypotension/shock.  LFTs now trending down.  - Maintain MAP and CO, should improve over time.  - Stopped statin for now.  4. CAD: Known CTO RCA, prior DES to LCx/OM in 2016.  Peak hs-TnI 548.  Most likely demand ischemia from shock.  - Holding statin with liver failure, restart as LFTs trend down.  - As above, would consider coronary angiography once creatinine comes down.  5. Hyperthyroidism: Thyrotoxicosis likely helps drive the atrial fibrillation/flutter.  This may have been triggered by chronic amiodarone use.  Unfortunately, she does not have a lot of options right now and will need to continue amiodarone.  - She is now off steroids.   - Continue methimazole.  6. AKI: On CKD stage 3.  Suspect due to shock. Creatinine lower today but BUN higher, UOP better.  7. Aortic stenosis: Probably moderate.  8. Acute hypoxemic respiratory failure: Suspected pulmonary edema, but based on behavior on vent, ?underlying COPD.  She is a prior smoker. CXR today with infiltrates, no fever and WBCs mildly elevated.  Self-extubated today and re-intubated.  - Per CCM.  - Will get trach aspirate culture and obtain procalcitonin, not currently on antibiotics.   CRITICAL CARE Performed by: Loralie Champagne  Total critical care time: 40 minutes  Critical care time was exclusive of  separately billable procedures and treating other patients.  Critical care was necessary to treat or prevent imminent or life-threatening deterioration.  Critical care was time spent personally by me on the following activities: development of treatment plan with  patient and/or surrogate as well as nursing, discussions with consultants, evaluation of patient's response to treatment, examination of patient, obtaining history from patient or surrogate, ordering and performing treatments and interventions, ordering and review of laboratory studies, ordering and review of radiographic studies, pulse oximetry and re-evaluation of patient's condition.   Length of Stay: 6  Loralie Champagne, MD  03/01/2019, 10:10 AM  Advanced Heart Failure Team Pager [phone] (M-F; 7a - 4p)  Please contact [facility] for night-coverage after hours (4p -7a ) and weekends on [email or web address]

## 2019-03-01 NOTE — Progress Notes (Signed)
[place] for Heparin  Indication: atrial fibrillation  Allergies  Allergen Reactions  . Contrast Media [Iodinated Diagnostic Agents] Hives and Other (See Comments)    Spoke to patient, Iodine allergy is really IV contrast allergy.   [name] [Insulin Aspart] Shortness Of Breath and Other (See Comments)    "breathing problems"  . Codeine Nausea And Vomiting and Other (See Comments)    HIGH DOSES-SEVERE VOMITING  . Iodine Other (See Comments)    MUST HAVE BENADRYL PRIOR TO PROCEDURE AND RIGHT BEFORE TREATMENT TO COUNTERACT REACTION-BLISTERING REACTION DERMATOLOGICAL  . Penicillins Itching, Rash and Other (See Comments)    Has patient had a PCN reaction causing immediate rash, facial/tongue/throat swelling, SOB or lightheadedness with hypotension: no Has patient had a PCN reaction causing severe rash involving mucus membranes or skin necrosis: No Has patient had a PCN reaction that required hospitalization No Has patient had a PCN reaction occurring within the last 10 years: No If all of the above answers are "NO", then may proceed with Cephalosporin use.  CHEST SIZED RASH AND ITCHING   . Ace Inhibitors Cough  . Demerol [Meperidine] Nausea And Vomiting  . Dilaudid [Hydromorphone Hcl] Other (See Comments)    HEADACHE   . Neosporin [Neomycin-Bacitracin Zn-Polymyx] Itching, Rash and Other (See Comments)    MAKES REACTIONS WORSE WHEN USING AS PROPHYLACTIC  . Percocet [Oxycodone-Acetaminophen] Rash  . Tape Itching and Rash    Patient Measurements: Height: 5\' 4"  (162.6 cm) Weight: 190 lb 14.7 oz (86.6 kg) IBW/kg (Calculated) : 54.7 Heparin Dosing Weight: 75kg  Vital Signs: Temp: 98.2 F (36.8 C) (10/22 1156) Temp Source: Oral (10/22 1156) BP: 110/87 (10/22 1540) Pulse Rate: 127 (10/22 1540)  Labs: Recent Labs    02/27/19 0353  02/28/19 0455 02/28/19 1247 03/01/19 0358 03/01/19 1518  HGB 9.9*   < > 9.8* 11.2* 10.3*  --   HCT 32.9*    < > 31.6* 33.0* 33.0*  --   PLT 391  --  337  --  322  --   APTT 118*   < > 70*  --  54* 83*  HEPARINUNFRC  --   --   --   --  1.66*  --   CREATININE 2.65*  --  2.32*  --  1.87*  --    < > = values in this interval not displayed.    Estimated Creatinine Clearance: 31.5 mL/min (A) (by C-G formula based on SCr of 1.87 mg/dL (H)).  Assessment: 68yof admitted for SOB in setting of Afib RVR was supposed to have DCCV but deferred because patient missed doses of apixaban prior. She developed hypotension, cardiogenic shock required DCCV on 10/17.  Previously on apixaban last dose 10/17 pm. -aPTT at goal 83 sec -heparin level remains falsely elevated from DOAC use at 1.66 -hgb up 10.3, pltc wnl  Goal of Therapy:  Heparin level 0.3-0.7 aPTT aptt 66-90 sec seconds Monitor platelets by anticoagulation protocol: Yes   Plan:  -Continue IV heparin at 1100 units/hr -Heparin level, aPTT and CBC daily -Monitor for bleeding  Erin Hearing PharmD., BCPS Clinical Pharmacist 03/01/2019 4:30 PM  Please check [email or web address] for unit-specific pharmacist phone numbers

## 2019-03-01 NOTE — Progress Notes (Signed)
[facility] for Heparin  Indication: atrial fibrillation  Allergies  Allergen Reactions  . Contrast Media [Iodinated Diagnostic Agents] Hives and Other (See Comments)    Spoke to patient, Iodine allergy is really IV contrast allergy.   [name] [Insulin Aspart] Shortness Of Breath and Other (See Comments)    "breathing problems"  . Codeine Nausea And Vomiting and Other (See Comments)    HIGH DOSES-SEVERE VOMITING  . Iodine Other (See Comments)    MUST HAVE BENADRYL PRIOR TO PROCEDURE AND RIGHT BEFORE TREATMENT TO COUNTERACT REACTION-BLISTERING REACTION DERMATOLOGICAL  . Penicillins Itching, Rash and Other (See Comments)    Has patient had a PCN reaction causing immediate rash, facial/tongue/throat swelling, SOB or lightheadedness with hypotension: no Has patient had a PCN reaction causing severe rash involving mucus membranes or skin necrosis: No Has patient had a PCN reaction that required hospitalization No Has patient had a PCN reaction occurring within the last 10 years: No If all of the above answers are "NO", then may proceed with Cephalosporin use.  CHEST SIZED RASH AND ITCHING   . Ace Inhibitors Cough  . Demerol [Meperidine] Nausea And Vomiting  . Dilaudid [Hydromorphone Hcl] Other (See Comments)    HEADACHE   . Neosporin [Neomycin-Bacitracin Zn-Polymyx] Itching, Rash and Other (See Comments)    MAKES REACTIONS WORSE WHEN USING AS PROPHYLACTIC  . Percocet [Oxycodone-Acetaminophen] Rash  . Tape Itching and Rash    Patient Measurements: Height: 5\' 4"  (162.6 cm) Weight: 190 lb 14.7 oz (86.6 kg) IBW/kg (Calculated) : 54.7 Heparin Dosing Weight: 75kg  Vital Signs: Temp: 98.8 F (37.1 C) (10/22 0400) Temp Source: Oral (10/22 0400) BP: 141/95 (10/22 0600) Pulse Rate: 127 (10/22 0600)  Labs: Recent Labs    02/27/19 0353  02/27/19 1718 02/28/19 0455 02/28/19 1247 03/01/19 0358  HGB 9.9*   < >  --  9.8* 11.2* 10.3*  HCT 32.9*    < >  --  31.6* 33.0* 33.0*  PLT 391  --   --  337  --  322  APTT 118*   < > 79* 70*  --  54*  HEPARINUNFRC  --   --   --   --   --  1.66*  CREATININE 2.65*  --   --  2.32*  --  1.87*   < > = values in this interval not displayed.    Estimated Creatinine Clearance: 31.5 mL/min (A) (by C-G formula based on SCr of 1.87 mg/dL (H)).  Assessment: 68yof admitted for SOB in setting of Afib RVR was supposed to have DCCV but deferred because patient missed doses of apixaban prior. She developed hypotension, cardiogenic shock required DCCV on 10/17.  Previously on apixaban last dose 10/17 pm. -aPTT low at 54 sec -heparin level remains falsely elevated from DOAC use at 1.66 -hgb up 10.3, pltc wnl  Goal of Therapy:  Heparin level 0.3-0.7 aPTT aptt 66-90 sec seconds Monitor platelets by anticoagulation protocol: Yes   Plan:  -Increase IV heparin to 1100 units/hr -Check 8 hour aPTT -Heparin level, aPTT and CBC daily -Monitor for bleeding  Vertis Kelch, PharmD PGY2 Cardiology Pharmacy Resident Phone [phone] 03/01/2019       6:59 AM  Please check [email or web address] for unit-specific pharmacist phone numbers

## 2019-03-01 NOTE — Progress Notes (Signed)
eLink Physician-Brief Progress Note Patient Name: Judith [name] DOB: 04-29-1951 MRN: [id number]   Date of Service  03/01/2019  HPI/Events of Note  Blood sugar remains high despite 2 doses of Levemir, most recent blood sugar is 270 mg %.  eICU Interventions  Phase 2 iv Insulin glycemic control orders entered.        Kerry Kass Ogan 03/01/2019, 1:03 AM

## 2019-03-01 NOTE — Progress Notes (Addendum)
Progress Note  Patient Name: Judith [name] Date of Encounter: 03/01/2019  Primary Cardiologist: Fransico Him, MD   Subjective   Remains intubated  (self extubated this am required re-intubation), sedated  Inpatient Medications    Scheduled Meds: . atorvastatin  20 mg Per Tube Daily  . budesonide (PULMICORT) nebulizer solution  0.25 mg Nebulization BID  . buPROPion  75 mg Per Tube BID  . chlorhexidine gluconate (MEDLINE KIT)  15 mL Mouth Rinse BID  . Chlorhexidine Gluconate Cloth  6 each Topical Daily  . famotidine  20 mg Per Tube Daily  . ferrous sulfate  300 mg Per Tube Daily  . FLUoxetine  20 mg Per Tube QHS  . insulin detemir  15 Units Subcutaneous BID  . loratadine  10 mg Per Tube QHS  . magnesium oxide  400 mg Per Tube QHS  . mouth rinse  15 mL Mouth Rinse 10 times per day  . methimazole  10 mg Per Tube BID  . sodium chloride flush  10-40 mL Intracatheter Q12H   Continuous Infusions: . sodium chloride Stopped (02/25/19 2221)  . amiodarone 60 mg/hr (03/01/19 0700)  . DOBUTamine 2 mcg/kg/min (03/01/19 0914)  . feeding supplement (VITAL HIGH PROTEIN) 60 mL/hr at 02/28/19 1300  . fentaNYL infusion INTRAVENOUS 25 mcg/hr (03/01/19 0926)  . furosemide (LASIX) infusion 12 mg/hr (03/01/19 0700)  . heparin 1,100 Units/hr (03/01/19 0801)  . insulin 2.9 Units/hr (03/01/19 0949)  . norepinephrine    . norepinephrine (LEVOPHED) Adult infusion Stopped (02/27/19 1321)   PRN Meds: sodium chloride, acetaminophen, albuterol, bisacodyl, bisacodyl, docusate, docusate, fentaNYL, fentaNYL, Melatonin, midazolam, midazolam, nitroGLYCERIN, ondansetron (ZOFRAN) IV, polyethylene glycol, sodium chloride flush, triamcinolone   Vital Signs    Vitals:   03/01/19 0600 03/01/19 0700 03/01/19 0855 03/01/19 0917  BP: (!) 141/95 130/90 (!) 87/70   Pulse: (!) 127 (!) 128 (!) 124   Resp: '14 15 18   ' Temp:  (!) 97.4 F (36.3 C)    TempSrc:  Oral    SpO2: 93% 96% 94% 95%  Weight:      Height:         Intake/Output Summary (Last 24 hours) at 03/01/2019 1006 Last data filed at 03/01/2019 0700 Gross per 24 hour  Intake 2548.5 ml  Output 3675 ml  Net -1126.5 ml   Last 3 Weights 03/01/2019 02/28/2019 02/27/2019  Weight (lbs) 190 lb 14.7 oz 186 lb 1.1 oz 186 lb 4.6 oz  Weight (kg) 86.6 kg 84.4 kg 84.5 kg      Telemetry    Afib this am 120's- Personally Reviewed  ECG    No new EKGs - Personally Reviewed  Physical Exam    GEN: sedated, ill appearing Neck: ++ JVD Cardiac: irreg-irreg, tachycardic, 1-2/6 SM, no rubs, or gallops.  Respiratory: scattered rhonchi b/l. GI: Soft,  non-distended  MS: No edema left, R BKA. Neuro:  sedated  Psych: sedated  Labs    High Sensitivity Troponin:   Recent Labs  Lab 02/13/2019 1459 03/10/2019 1644 03/05/2019 1738 02/22/2019 1954  TROPONINIHS 375* 197* 476* 548*      Chemistry Recent Labs  Lab 02/27/19 0353  02/28/19 0455 02/28/19 1247 03/01/19 0358  NA 130*   < > 130* 130* 132*  K 4.0   < > 3.8 3.7 3.4*  CL 89*  --  90*  --  87*  CO2 27  --  27  --  32  GLUCOSE 264*  --  241*  --  261*  BUN 80*  --  96*  --  92*  CREATININE 2.65*  --  2.32*  --  1.87*  CALCIUM 8.5*  --  8.5*  --  9.1  PROT 5.7*  --  5.6*  --  6.2*  ALBUMIN 2.6*  --  2.4*  --  2.5*  AST 675*  --  220*  --  105*  ALT 832*  --  556*  --  355*  ALKPHOS 130*  --  131*  --  138*  BILITOT 1.5*  --  0.9  --  1.1  GFRNONAA 18*  --  21*  --  28*  GFRAA 21*  --  25*  --  32*  ANIONGAP 14  --  13  --  13   < > = values in this interval not displayed.     Hematology Recent Labs  Lab 02/27/19 0353  02/28/19 0455 02/28/19 1247 03/01/19 0358  WBC 12.3*  --  11.6*  --  13.0*  RBC 3.81*  --  3.69*  --  3.91  HGB 9.9*   < > 9.8* 11.2* 10.3*  HCT 32.9*   < > 31.6* 33.0* 33.0*  MCV 86.4  --  85.6  --  84.4  MCH 26.0  --  26.6  --  26.3  MCHC 30.1  --  31.0  --  31.2  RDW 16.7*  --  16.9*  --  17.1*  PLT 391  --  337  --  322   < > = values in this  interval not displayed.    BNP Recent Labs  Lab 02/25/2019 1459  BNP 1,119.0*     DDimer No results for input(s): DDIMER in the last 168 hours.   Radiology       Cardiac Studies    02/25/2019: TTE IMPRESSIONS  1. Left ventricular ejection fraction, by visual estimation, is 30 to 35%. The left ventricle has moderate to severely decreased function. Normal left ventricular size. There is mildly increased left ventricular hypertrophy.  2. Wall motion anlysis was limited (parasternal and apical-4-chamber views only), but there is global LV hypokinesis with severe inferosptal hypokinesis.  3. Left ventricular diastolic function could not be evaluated due to atrial fibrillation.  4. Left atrial size was severely dilated.  5. Right atrial size was moderately dilated.  6. Moderate to severe mitral annular calcification.  7. The mitral valve is degenerative. Moderate mitral valve regurgitation.  8. The tricuspid valve is grossly normal. Tricuspid valve regurgitation mild-moderate.  9. The aortic valve is tricuspid. Mild to moderate aortic valve sclerosis/calcification. There is no aortic insufficiency. 10. Gradients underestimate the degree of aortic stenosis due to low stroke volume. Overall impression is of moderate aortic stenosis. 11. The right ventricle has mildly reduced global systolic function.The right ventricular size is normal. No increase in right ventricular wall thickness. 12. Compared to January 2020, left ventricular systolic function has deteriorated globally, but there also appears to be new inferoseptal severe hypokinesis, disproportionate to the remainder of the LV. Mitral regurgitation has worsened and aortic valve  gradients are lower due to poor cardiac output.  05/16/2018 TTE, LVEF 60-65%   Patient Profile     68 y.o. 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), CKD (III), and persistent AFib Admitted to [facility] 2/2 increasing palpitations, CP,  and SOB after being started on  a steroid dose pack for rash Found in rapid AFib placed on IV amio >> atypical Aflutter w/RVR aberrantly conducted Worsening CHF and decreased EF in this setting Underwent urgent DCCV 03/09/2019 with what was felt impending shock 2/2 this was successful in restoring SR   AAD Hx Sotalol failed it seems with recurrent AFib Jan 2020 ER visit with [facility] >> suspect to have been aberrantly conducted AFib w/RVR, less likely VT though could not r/o Amiodarone is current (looks was started Jan 2020))  Assessment & Plan    1. Persistent AFib     CHA2DS2Vasc is 5  Not felt to have any other AAD options Dr. Rayann Heman was not optimistic about AFib ablation and long term success gioven LA enlargement and hyperthyroidism  amo gtt started 03/08/2019 > emergent DCCV > SR > back in AFib 02/27/2019 >> amio re-bolused and remains at 57m/hr since then On heparin gtt, (off Eliquis with high LFTs, shock liver, elevated INR)   Planned for DCCV this afternoon with Dr. MAundra DubinNo new recommendations from EP. I think we would need to see significant improvement clinically before consideration of pace/ablate strategy/procedures   In my review of record, pt was known to be hyperthyroid PRIOR to amiodarone start.   2. Acute CHF exacerbation associated with shock, MSOF AKI    BUN/Creat continue to rise Liver failure    LFTs trending down Respiratory failue     Required ETT, failed trials yesterday > self extubate this AM and reintubated     Not candidate for trials today by CCM note, mucus noted at re-intubation, concerns for possible VAP,  >> sedated     AHF consulted to case 02/25/2019  Remains on dobutamine gtt down to 2 today Remains off levophed since 02/27/2019  Fluid neg yesterday  -[id number](1st day negative)   3. CAD     Planned for eventual cath once clinically able    4. Hyperthyriodsm     CCM note reviewed, not felt to be thyroid storm, off steroids, back on methimazole (was stopped outpatient)      For questions or updates, please contact [facility] consult [email or web address] for contact info under        Signed, ReBaldwin JamaicaPA-C  03/01/2019, 10:06 AM    I have seen, examined the patient, and reviewed the above assessment and plan.  Changes to above are made where necessary.  On exam, intubated,  Ill appearing, tachycardic irregular rhythm.  The patient remains quite ill.  Unfortunately, she has returned to afib.  Dr McAundra Dubinnd I have discussed at length.  She will have repeat cardioversion today.  Continue amiodarone.  Wean dobutamine as able.  Not currently a candidate for EP procedures and amiodarone is her only AAD option.  Her prognosis is poor.  EP to see when needed.  JaThompson GrayerD, FAElizabethtown0/22/2020

## 2019-03-01 NOTE — H&P (View-Only) (Signed)
Patient ID: Judith [name], female   DOB: October 31, 1950, 68 y.o.   MRN: [id number]     Advanced Heart Failure Rounding Note  PCP-Cardiologist: Fransico Him, MD   Subjective:    Patient went back into atrial fibrillation with RVR 10/20, amiodarone increased to 60 mg/hr. HR 120s today.   Remains on dobutamine now down to 3 mcg/kg/min and off norepinephrine.  MAP has been stable, co-ox 64.5%.  CVP 14 this morning.  UOP better yesterday, creatinine lower at 1.87 but BUN higher.   She self-extubated this morning.  Due to respiratory distress, she was re-intubated and is now stable.    Objective:   Weight Range: 86.6 kg Body mass index is 32.77 kg/m.   Vital Signs:   Temp:  [97.4 F (36.3 C)-98.8 F (37.1 C)] 97.4 F (36.3 C) (10/22 0700) Pulse Rate:  [101-128] 124 (10/22 0855) Resp:  [9-29] 18 (10/22 0855) BP: (87-141)/(70-96) 87/70 (10/22 0855) SpO2:  [88 %-96 %] 95 % (10/22 0917) FiO2 (%):  [40 %] 40 % (10/22 0917) Weight:  [86.6 kg] 86.6 kg (10/22 0500) Last BM Date: 02/21/19  Weight change: Filed Weights   02/27/19 0500 02/28/19 0500 03/01/19 0500  Weight: 84.5 kg 84.4 kg 86.6 kg    Intake/Output:   Intake/Output Summary (Last 24 hours) at 03/01/2019 1010 Last data filed at 03/01/2019 0700 Gross per 24 hour  Intake 2548.5 ml  Output 3675 ml  Net -1126.5 ml      Physical Exam   General: NAD, sedated Neck: JVP 12 cm, no thyromegaly or thyroid nodule.  Lungs: Crackles dependently.  CV: Nondisplaced PMI.  Heart tachy, irregular S1/S2, no S3/S4, 2/6 SEM RUSB.  1+ ankle edema.  Abdomen: Soft, nontender, no hepatosplenomegaly, no distention.  Skin: Intact without lesions or rashes.  Neurologic: Was alert this morning per nurse and following commands, now sedated.  Extremities: No clubbing or cyanosis.  HEENT: Normal.    Telemetry   Atrial fibrillation with RVR 120s (personally reviewed)   Labs    CBC Recent Labs    02/28/19 0455 02/28/19 1247 03/01/19  0358  WBC 11.6*  --  13.0*  HGB 9.8* 11.2* 10.3*  HCT 31.6* 33.0* 33.0*  MCV 85.6  --  84.4  PLT 337  --  549   Basic Metabolic Panel Recent Labs    02/27/19 0353  02/28/19 0455 02/28/19 1247 03/01/19 0358  NA 130*   < > 130* 130* 132*  K 4.0   < > 3.8 3.7 3.4*  CL 89*  --  90*  --  87*  CO2 27  --  27  --  32  GLUCOSE 264*  --  241*  --  261*  BUN 80*  --  96*  --  92*  CREATININE 2.65*  --  2.32*  --  1.87*  CALCIUM 8.5*  --  8.5*  --  9.1  MG 2.3  --   --   --   --    < > = values in this interval not displayed.   Liver Function Tests Recent Labs    02/28/19 0455 03/01/19 0358  AST 220* 105*  ALT 556* 355*  ALKPHOS 131* 138*  BILITOT 0.9 1.1  PROT 5.6* 6.2*  ALBUMIN 2.4* 2.5*   No results for input(s): LIPASE, AMYLASE in the last 72 hours. Cardiac Enzymes No results for input(s): CKTOTAL, CKMB, CKMBINDEX, TROPONINI in the last 72 hours.  BNP: BNP (last 3 results) Recent Labs  03/10/2019 1459  BNP 1,119.0*    ProBNP (last 3 results) Recent Labs    10/13/18 1057 01/18/19 1445  PROBNP 2,908* 2,645*     D-Dimer No results for input(s): DDIMER in the last 72 hours. Hemoglobin A1C No results for input(s): HGBA1C in the last 72 hours. Fasting Lipid Panel No results for input(s): CHOL, HDL, LDLCALC, TRIG, CHOLHDL, LDLDIRECT in the last 72 hours. Thyroid Function Tests No results for input(s): TSH, T4TOTAL, T3FREE, THYROIDAB in the last 72 hours.  Invalid input(s): FREET3  Other results:   Imaging    Dg Chest Port 1 View  Result Date: 03/01/2019 CLINICAL DATA:  Check endotracheal tube placement EXAM: PORTABLE CHEST 1 VIEW COMPARISON:  03/01/2019 FINDINGS: Endotracheal tube is again identified 3.5 cm above the carina. Gastric catheter extends into the stomach. Right jugular central line is noted at the cavoatrial junction. The lungs are hypoinflated with diffuse bilateral opacities likely representing edema mildly increased from the prior exam.  No bony abnormality is noted. IMPRESSION: Bilateral parenchymal opacities slightly improved from the prior exam. Tubes and lines as described above in satisfactory position. Electronically Signed   By: Inez Catalina M.D.   On: 03/01/2019 09:17   Dg Chest Port 1 View  Result Date: 03/01/2019 CLINICAL DATA:  Respiratory failure. EXAM: PORTABLE CHEST 1 VIEW COMPARISON:  February 27, 2019. FINDINGS: Stable cardiomegaly. Endotracheal and nasogastric tubes are unchanged in position. Right internal jugular catheter is unchanged. No pneumothorax or pleural effusion is noted. Increased bilateral lung opacities are noted concerning for worsening edema or pneumonia. Bony thorax is unremarkable. IMPRESSION: Stable support apparatus. Increased bilateral lung opacities as described above. Electronically Signed   By: Marijo Conception M.D.   On: 03/01/2019 07:31   Dg Abd Portable 1v  Result Date: 03/01/2019 CLINICAL DATA:  Check gastric catheter placement EXAM: PORTABLE ABDOMEN - 1 VIEW COMPARISON:  02/23/2018 FINDINGS: Gastric catheter is noted extending into the distal stomach. Scattered large and small bowel gas is noted. No free air is seen. IMPRESSION: Gastric catheter within the stomach. Electronically Signed   By: Inez Catalina M.D.   On: 03/01/2019 09:17     Medications:     Scheduled Medications: . atorvastatin  20 mg Per Tube Daily  . budesonide (PULMICORT) nebulizer solution  0.25 mg Nebulization BID  . buPROPion  75 mg Per Tube BID  . chlorhexidine gluconate (MEDLINE KIT)  15 mL Mouth Rinse BID  . Chlorhexidine Gluconate Cloth  6 each Topical Daily  . famotidine  20 mg Per Tube Daily  . ferrous sulfate  300 mg Per Tube Daily  . FLUoxetine  20 mg Per Tube QHS  . insulin detemir  15 Units Subcutaneous BID  . loratadine  10 mg Per Tube QHS  . magnesium oxide  400 mg Per Tube QHS  . mouth rinse  15 mL Mouth Rinse 10 times per day  . methimazole  10 mg Per Tube BID  . sodium chloride flush  10-40  mL Intracatheter Q12H    Infusions: . sodium chloride Stopped (02/25/19 2221)  . amiodarone 60 mg/hr (03/01/19 0700)  . DOBUTamine 2 mcg/kg/min (03/01/19 0914)  . feeding supplement (VITAL HIGH PROTEIN) 60 mL/hr at 02/28/19 1300  . fentaNYL infusion INTRAVENOUS 25 mcg/hr (03/01/19 0926)  . furosemide (LASIX) infusion 12 mg/hr (03/01/19 0700)  . heparin 1,100 Units/hr (03/01/19 0801)  . insulin 2.9 Units/hr (03/01/19 0949)  . norepinephrine    . norepinephrine (LEVOPHED) Adult infusion Stopped (02/27/19 1321)  PRN Medications: sodium chloride, acetaminophen, albuterol, bisacodyl, bisacodyl, docusate, docusate, fentaNYL, fentaNYL, Melatonin, midazolam, midazolam, nitroGLYCERIN, ondansetron (ZOFRAN) IV, polyethylene glycol, sodium chloride flush, triamcinolone   Assessment/Plan   1. Acute on chronic systolic CHF/cardiogenic shock: Echo this admission with new fall in EF, 30-35% with diffuse hypokinesis worse in the inferoseptal wall, severe LAE. Possible tachy-mediated CMP as she was in rapid atypical atrial flutter rate 150s at admission.  She converted to NSR but now is back in atrial fibrillation.  HR in 120s today. Co-ox 64.5% this morning with CVP at 14, UOP picking up.  Creatinine lower but BUN higher. She remains on dobutamine 3 but is off norepinephrine.  - With afib/RVR and adquate co-ox, will decrease dobutamine slowly to 2 today.   - Continue Lasix gtt at 12 mg/hr.  - Would ideally have workup for coronary disease as cause of cardiomyopathy (has known prior CAD), but would hold off until creatinine comes down and stabilizes.  2. Atypical atrial flutter/atrial fibrillation: Patient has had both atrial fibrillation and atypical flutter.  Admitted with atypical flutter/RVR in 150s.  She had emergent DCCV initially and remained in NSR for several days but back in persistent afib 10/20.  She has severe LAE and moderate RAE. She has been seen by Dr. Rayann Heman.  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 She is not a good ablation candidate given the size of the atria though could consider if she holds NSR for a month or two.  Rate is still not controlled.  - Continue amiodarone gtt at 60 mg/hr and will decrease dobutamine as above.  Tube feeds on hold and will plan DCCV later today.  Discussed with family and Dr. Rayann Heman.  - Eventual option may be AV nodal ablation/BiV pacing.  - Continue heparin gtt (stopped apixaban).   3. Liver failure: Markedly elevated transaminases.  Suspect shock liver from hypotension/shock.  LFTs now trending down.  - Maintain MAP and CO, should improve over time.  - Stopped statin for now.  4. CAD: Known CTO RCA, prior DES to LCx/OM in 2016.  Peak hs-TnI 548.  Most likely demand ischemia from shock.  - Holding statin with liver failure, restart as LFTs trend down.  - As above, would consider coronary angiography once creatinine comes down.  5. Hyperthyroidism: Thyrotoxicosis likely helps drive the atrial fibrillation/flutter.  This may have been triggered by chronic amiodarone use.  Unfortunately, she does not have a lot of options right now and will need to continue amiodarone.  - She is now off steroids.   - Continue methimazole.  6. AKI: On CKD stage 3.  Suspect due to shock. Creatinine lower today but BUN higher, UOP better.  7. Aortic stenosis: Probably moderate.  8. Acute hypoxemic respiratory failure: Suspected pulmonary edema, but based on behavior on vent, ?underlying COPD.  She is a prior smoker. CXR today with infiltrates, no fever and WBCs mildly elevated.  Self-extubated today and re-intubated.  - Per CCM.  - Will get trach aspirate culture and obtain procalcitonin, not currently on antibiotics.   CRITICAL CARE Performed by: Loralie Champagne  Total critical care time: 40 minutes  Critical care time was exclusive of  separately billable procedures and treating other patients.  Critical care was necessary to treat or prevent imminent or life-threatening deterioration.  Critical care was time spent personally by me on the following activities: development of treatment plan with  patient and/or surrogate as well as nursing, discussions with consultants, evaluation of patient's response to treatment, examination of patient, obtaining history from patient or surrogate, ordering and performing treatments and interventions, ordering and review of laboratory studies, ordering and review of radiographic studies, pulse oximetry and re-evaluation of patient's condition.   Length of Stay: 6  Loralie Champagne, MD  03/01/2019, 10:10 AM  Advanced Heart Failure Team Pager [phone] (M-F; 7a - 4p)  Please contact [facility] for night-coverage after hours (4p -7a ) and weekends on [email or web address]

## 2019-03-01 NOTE — Procedures (Signed)
Intubation Procedure Note KIANNI LHEUREUX [id number] Oct 17, 1950  Procedure: Intubation Indications: acute resp failure following self extubation  Procedure Details Consent: Unable to obtain consent because of emergent medical necessity. Time Out: Verified patient identification, verified procedure, site/side was marked, verified correct patient position, special equipment/implants available, medications/allergies/relevent history reviewed, required imaging and test results available.  Performed  Maximum sterile technique was used including gloves, gown, hand hygiene and mask.  MAC    Evaluation Hemodynamic Status: BP stable throughout; O2 sats: stable throughout Patient's Current Condition: stable Complications: No apparent complications Patient did tolerate procedure well. Chest X-ray ordered to verify placement.  CXR: pending.    SIGNATURE    Dr. Brand Males, M.D., F.C.C.P,  Pulmonary and Critical Care Medicine Staff Physician, [facility] Director - Interstitial Lung Disease  Program  Pulmonary [place] [facility] [place], [place], [address]  Pager: [phone], If no answer or between  15:00h - 7:00h: call [phone] Telephone: [phone]  9:02 AM 03/01/2019

## 2019-03-01 NOTE — Progress Notes (Signed)
eLink Physician-Brief Progress Note Patient Name: MERCADIES TRUXILLO DOB: May 14, 1950 MRN: [id number]   Date of Service  03/01/2019  HPI/Events of Note  Pt self-extubated earlier today and required immediate re-intubation, RN requests soft restraints for pt safety.  eICU Interventions  Bilateral soft wrist restraints ordered.        Kerry Kass Ogan 03/01/2019, 8:29 PM

## 2019-03-01 NOTE — Progress Notes (Signed)
NAME:  Judith [name], MRN:  [id number], DOB:  04-07-1951, LOS: 6 ADMISSION DATE:  02/28/2019, CONSULTATION DATE:  02/26/2019 REFERRING MD:  Dr. Rayann Heman, CHIEF COMPLAINT:  Resp. Fail.   Brief History   68 y/o with multiple comorbidities, including paroxysmal afib. Admitted in afib w/ RVR with what appears to be developing CHF. Admitted 10/16, became more SOB 10/17, still in AFib despite amiodarone and dilt. Taken for cardioversion but became more hypotensive,  requiring pressor support, intubated. Called for vent management; assistance with pressors   Past Medical History  1) paroxysmal afib 2) CHF 3) COPD(?) 4) Hyperthyroidism 5) IDDM 6) CKD stage III Significant Hospital Events   10/17 cardioversion 10/17 intubation       10/19 Severe vent asynchrony overnight,  Vecuronium x1 on 2 consecutive nights, changed to pressure control 10/20 titrating pressors and inotrope support.  Had been on bicarb gtt this was stopped.  10/21 Hyperglycemic overnight; insulin gtt started. Off norepinephrine, Co-ox 73% on dobutamine. [place] a little better. Back in AF. Decreasing dobutamine to 3. Lasix 80mg   f/b gtt started. Failed SBT attempt.  Consults:  Cardiology PCCM  Procedures:  10/17--aline 10/17 ETT >> 10/17 RIJ central line >> 10/17 cardioversion  Significant Diagnostic Tests:  10/18: ULTRASOUND ABDOMEN LIMITED RIGHT UPPER QUADRANT 1. Mild gallbladder wall thickening up to 7 mm, which could be related to incomplete distension. No other sonographic features for acute cholecystitis. 2. Previously seen cholelithiasis not visualized on today's exam. 3. No biliary dilatation or other acute finding.   10/17 TTE: EF  30 to 35%.  global LV hypokinesis with severe inferosptal hypokinesis.  mod AS, mod MR    Micro Data:  COVID neg HIV neg MRSA neg Antimicrobials:  none   Interim history/subjective:    03/01/2019 - called stat for sslef exubation. Bagging was going on and got reintubatd by  this CCM MD. On dobutamine at time of reitnubation with CVP 13 . On amio gtt, lasix gtt, and and dobutamine gtt. Was on prn sedation pre extubation  Has been afebrile  Objective   Blood pressure (!) 87/70, pulse (!) 124, temperature (!) 97.4 F (36.3 C), temperature source Oral, resp. rate 18, height 5\' 4"  (1.626 m), weight 86.6 kg, SpO2 95 %.    Vent Mode: PCV FiO2 (%):  [40 %] 40 % Set Rate:  [8 bmp-18 bmp] 18 bmp PEEP:  [5 cmH20] 5 cmH20 Pressure Support:  [15 cmH20] 15 cmH20 Plateau Pressure:  [12 cmH20-20 cmH20] 20 cmH20   Intake/Output Summary (Last 24 hours) at 03/01/2019 [id number] Last data filed at 03/01/2019 0700 Gross per 24 hour  Intake 2605.9 ml  Output 4200 ml  Net -1594.1 ml   Filed Weights   02/27/19 0500 02/28/19 0500 03/01/19 0500  Weight: 84.5 kg 84.4 kg 86.6 kg   Exam at and post intubation  General Appearance:  Looks criticall ill OBESE - + Head:  Normocephalic, without obvious abnormality, atraumatic Eyes:  PERRL - not examined, conjunctiva/corneas - mudd     Ears:  Normal external ear canals, both ears Nose:  G tube - no Throat:  ETT TUBE - yes , OG tube - no. MUCUS + at time of reintubation Neck:  Supple,  No enlargement/tenderness/nodules Lungs: Clear to auscultation bilaterally, Ventilator   Synchrony - yes post intuation Heart:  S1 and S2 normal, no murmur, CVP - x.  Pressors - dobutamine Abdomen:  Soft, no masses, no organomegaly Genitalia / Rectal:  Not done Extremities:  Extremities- no  Skin:  ntact in exposed areas . Sacral area - no texamined Neurologic:  Sedation - -4 post etomidate, and roc -> RASS - -4      Resolved Hospital Problem list   Cardiogenic shock Metabolic acidosis Assessment & Plan:  Multiple comorbidities including Afib, cardioverted with subsequent cardiogenic shock and acute respiratory failure   Acute respiratory failure in setting of pulmonary edema and cardiogenic shock - Severe vent asynchrony requiring paralytic  10/19 & 10/20,   03/01/2019 - reintubation and does not meet criteria for extubatin or SBT. Has pulmonary infiltrates. Need to rule out VAP given mucus presence at time of reintubation  Plan PRVC IV diuresis per card VAP bundle Tracheal aspirate to rule out VAP   Sedation needs  Plan  - add fent gtt - rass goal 0 to -2  Acute systolic heart failure  [id number] -on dobutamine gtt and llasix gtt by card  Plan pe CHF team   Atrial fibrillation with RVR-status post cardioversion   - 03/01/2019 - on amio gtt  Plan Continuing amiodarone and heparin per cardiology service    AKI -likely cardiorenal plus diuretic induced, underlying CKD stage III,  03/01/2019 - AKI better   Plan Strict intake output Serial chemistries  Fluid and electrolyte imbalance: Mild hyponatremia-diuretic induced Plan Serial chemistries  Shock liver-improving, follow LFTs intermittently Plan Intermittent LFTs  IDDM: Uncontrolled due to steroids  Placed on insulin drip last night now better glycemic control Plan Attempt sliding scale insulin  Thyrotoxicosis-for some reason, 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 Plan  Methimazole was resumed 10/19    Best practice:  Diet: NPO Pain/Anxiety/Delirium protocol (if indicated): Fentanyl drip, intermittent Versed VAP protocol (if indicated): in place DVT prophylaxis: heparin, SCDs GI prophylaxis: per protocol Glucose control: SSI Mobility: bed Code Status: full Family Communication: Sister 10/20, I updated her daughter at bedside and again 10/222/2020.  Palliative was called   Disposition: ICU    ATTESTATION & SIGNATURE   The patient Judith [name] is critically ill with multiple organ systems failure and requires high complexity decision making for assessment and support, frequent evaluation and titration of therapies, application of advanced monitoring technologies and extensive interpretation of multiple  databases.   Critical Care Time devoted to patient care services described in this note is  30  Minutes. This time reflects time of care of this signee Dr Brand Males. This critical care time does not reflect procedure time, or teaching time or supervisory time of PA/NP/Med student/Med Resident etc but could involve care discussion time     Dr. Brand Males, M.D., [facility].C.P Pulmonary and Critical Care Medicine Staff Physician [facility] Pager: [phone], If no answer or between  15:00h - 7:00h: call [phone]  03/01/2019 9:22 AM    LABS    PULMONARY Recent Labs  Lab 03/08/2019 1931  02/11/2019 2228  02/27/19 0322  02/27/19 0501 02/28/19 0456 02/28/19 1200 02/28/19 1247 03/01/19 0405  PHART 7.365  --  7.429  --  7.322*  --  7.395  --   --  7.412  --   PCO2ART 44.6  --  40.1  --  55.4*  --  44.8  --   --  49.9*  --   PO2ART 112.0*  --  116.0*  --  116.0*  --  115.0*  --   --  65.0*  --   HCO3 25.6  --  26.6  --  28.8*  --  27.5  --   --  31.8*  --   TCO2 27  --  28  --  30  --  29  --   --  33*  --   O2SAT 98.0   < > 99.0   < > 98.0   < > 98.0 72.9 61.3 92.0 64.5   < > = values in this interval not displayed.    CBC Recent Labs  Lab 02/27/19 0353  02/28/19 0455 02/28/19 1247 03/01/19 0358  HGB 9.9*   < > 9.8* 11.2* 10.3*  HCT 32.9*   < > 31.6* 33.0* 33.0*  WBC 12.3*  --  11.6*  --  13.0*  PLT 391  --  337  --  322   < > = values in this interval not displayed.    COAGULATION Recent Labs  Lab 02/18/2019 1644 02/23/2019 0331 03/04/2019 1712 02/25/19 0250  INR 2.0* 3.2* 6.5* 5.4*    CARDIAC  No results for input(s): TROPONINI in the last 168 hours. No results for input(s): PROBNP in the last 168 hours.   CHEMISTRY Recent Labs  Lab 02/11/2019 1738  03/05/2019 1712  02/25/19 1735 02/26/19 0308  02/27/19 0353 02/27/19 0501 02/28/19 0455 02/28/19 1247 03/01/19 0358  NA  --    < > 129*   < > 130* 132*    < > 130* 131* 130* 130* 132*  K  --    < > 5.1   < > 4.3 4.8   < > 4.0 3.9 3.8 3.7 3.4*  CL  --    < > 92*   < > 90* 91*  --  89*  --  90*  --  87*  CO2  --    < > 17*   < > 27 27  --  27  --  27  --  32  GLUCOSE  --    < > 87   < > 155* 137*  --  264*  --  241*  --  261*  BUN  --    < > 39*   < > 53* 61*  --  80*  --  96*  --  92*  CREATININE  --    < > 2.29*   < > 2.04* 2.49*  --  2.65*  --  2.32*  --  1.87*  CALCIUM  --    < > 8.6*   < > 8.7* 8.5*  --  8.5*  --  8.5*  --  9.1  MG 1.9  --  2.0  --   --   --   --  2.3  --   --   --   --   PHOS  --   --  5.7*  --   --   --   --   --   --   --   --   --    < > = values in this interval not displayed.   Estimated Creatinine Clearance: 31.5 mL/min (A) (by C-G formula based on SCr of 1.87 mg/dL (H)).   LIVER Recent Labs  Lab 03/05/2019 1644 02/16/2019 0331  02/19/2019 1712 02/25/19 0250 02/26/19 0308 02/27/19 0353 02/28/19 0455 03/01/19 0358  AST  --   --    < >  --  3,843* 1,969* 675* 220* 105*  ALT  --   --    < >  --  1,306* 1,269* 832* 556* 355*  ALKPHOS  --   --    < >  --  151* 142* 130* 131* 138*  BILITOT  --   --    < >  --  1.7* 1.4* 1.5* 0.9 1.1  PROT  --   --    < >  --  6.1* 5.9* 5.7* 5.6* 6.2*  ALBUMIN  --   --    < >  --  2.9* 2.6* 2.6* 2.4* 2.5*  INR 2.0* 3.2*  --  6.5* 5.4*  --   --   --   --    < > = values in this interval not displayed.     INFECTIOUS Recent Labs  Lab 02/25/19 1735 02/25/19 2012 02/26/19 0902  LATICACIDVEN 1.5 2.3* 1.2     ENDOCRINE CBG (last 3)  Recent Labs    03/01/19 0626 03/01/19 0748 03/01/19 0811  GLUCAP 185* 167* 163*         IMAGING x48h  - image(s) personally visualized  -   highlighted in bold Dg Chest Port 1 View  Result Date: 03/01/2019 CLINICAL DATA:  Check endotracheal tube placement EXAM: PORTABLE CHEST 1 VIEW COMPARISON:  03/01/2019 FINDINGS: Endotracheal tube is again identified 3.5 cm above the carina. Gastric catheter extends into the stomach. Right  jugular central line is noted at the cavoatrial junction. The lungs are hypoinflated with diffuse bilateral opacities likely representing edema mildly increased from the prior exam. No bony abnormality is noted. IMPRESSION: Bilateral parenchymal opacities slightly improved from the prior exam. Tubes and lines as described above in satisfactory position. Electronically Signed   By: Inez Catalina M.D.   On: 03/01/2019 09:17   Dg Chest Port 1 View  Result Date: 03/01/2019 CLINICAL DATA:  Respiratory failure. EXAM: PORTABLE CHEST 1 VIEW COMPARISON:  February 27, 2019. FINDINGS: Stable cardiomegaly. Endotracheal and nasogastric tubes are unchanged in position. Right internal jugular catheter is unchanged. No pneumothorax or pleural effusion is noted. Increased bilateral lung opacities are noted concerning for worsening edema or pneumonia. Bony thorax is unremarkable. IMPRESSION: Stable support apparatus. Increased bilateral lung opacities as described above. Electronically Signed   By: Marijo Conception M.D.   On: 03/01/2019 07:31   Dg Abd Portable 1v  Result Date: 03/01/2019 CLINICAL DATA:  Check gastric catheter placement EXAM: PORTABLE ABDOMEN - 1 VIEW COMPARISON:  02/23/2018 FINDINGS: Gastric catheter is noted extending into the distal stomach. Scattered large and small bowel gas is noted. No free air is seen. IMPRESSION: Gastric catheter within the stomach. Electronically Signed   By: Inez Catalina M.D.   On: 03/01/2019 09:17

## 2019-03-01 NOTE — Progress Notes (Signed)
Verbal order from Dr. Aundra Dubin for 4mg  versed prior to cardioversion. See MAR.   Alma Friendly RN

## 2019-03-01 NOTE — Interval H&P Note (Signed)
History and Physical Interval Note:  03/01/2019 1:06 PM  Judith [name]  has presented today for surgery, with the diagnosis of a-fib, RVR.  The various methods of treatment have been discussed with the patient and family. After consideration of risks, benefits and other options for treatment, the patient has consented to  Procedure(s): CARDIOVERSION (N/A) as a surgical intervention.  The patient's history has been reviewed, patient examined, no change in status, stable for surgery.  I have reviewed the patient's chart and labs.  Questions were answered to the patient's satisfaction.     Jamarquis Crull [employer]

## 2019-03-01 NOTE — Progress Notes (Signed)
This RN walked into the patients room and the patient was found with ETT between mitts, actively self extubating. The patient had completely removed the ETT from her airway. This RN called for help from other RNs on the floor and critical care MD arrived at bedside. Patients O2 sats decreased from 90s to 70s within about 30 seconds. RSI performed at the bedside by CCM MD. O2 sat goal achieved post reintubation at 96%.    Luberta Mutter RN

## 2019-03-02 ENCOUNTER — Inpatient Hospital Stay (HOSPITAL_COMMUNITY): Payer: Medicare PPO

## 2019-03-02 LAB — COMPREHENSIVE METABOLIC PANEL
ALT: 201 U/L — ABNORMAL HIGH (ref 0–44)
AST: 64 U/L — ABNORMAL HIGH (ref 15–41)
Albumin: 2.1 g/dL — ABNORMAL LOW (ref 3.5–5.0)
Alkaline Phosphatase: 123 U/L (ref 38–126)
Anion gap: 12 (ref 5–15)
BUN: 86 mg/dL — ABNORMAL HIGH (ref 8–23)
CO2: 33 mmol/L — ABNORMAL HIGH (ref 22–32)
Calcium: 8.8 mg/dL — ABNORMAL LOW (ref 8.9–10.3)
Chloride: 85 mmol/L — ABNORMAL LOW (ref 98–111)
Creatinine, Ser: 1.56 mg/dL — ABNORMAL HIGH (ref 0.44–1.00)
GFR calc Af Amer: 40 mL/min — ABNORMAL LOW (ref >60)
GFR calc non Af Amer: 34 mL/min — ABNORMAL LOW (ref >60)
Glucose, Bld: 187 mg/dL — ABNORMAL HIGH (ref 70–99)
Potassium: 3.7 mmol/L (ref 3.5–5.1)
Sodium: 130 mmol/L — ABNORMAL LOW (ref 135–145)
Total Bilirubin: 1.1 mg/dL (ref 0.3–1.2)
Total Protein: 5.7 g/dL — ABNORMAL LOW (ref 6.5–8.1)

## 2019-03-02 LAB — COOXEMETRY PANEL
Carboxyhemoglobin: 2.3 % — ABNORMAL HIGH (ref 0.5–1.5)
Carboxyhemoglobin: 2.3 % — ABNORMAL HIGH (ref 0.5–1.5)
Methemoglobin: 1.3 % (ref 0.0–1.5)
Methemoglobin: 0.9 % (ref 0.0–1.5)
O2 Saturation: 81.2 %
O2 Saturation: 63.2 %
Total hemoglobin: 9.7 g/dL — ABNORMAL LOW (ref 12.0–16.0)
Total hemoglobin: 9.2 g/dL — ABNORMAL LOW (ref 12.0–16.0)

## 2019-03-02 LAB — GLUCOSE, CAPILLARY
Glucose-Capillary: 128 mg/dL — ABNORMAL HIGH (ref 70–99)
Glucose-Capillary: 141 mg/dL — ABNORMAL HIGH (ref 70–99)
Glucose-Capillary: 142 mg/dL — ABNORMAL HIGH (ref 70–99)
Glucose-Capillary: 153 mg/dL — ABNORMAL HIGH (ref 70–99)
Glucose-Capillary: 182 mg/dL — ABNORMAL HIGH (ref 70–99)
Glucose-Capillary: 183 mg/dL — ABNORMAL HIGH (ref 70–99)

## 2019-03-02 LAB — CBC
HCT: 30.9 % — ABNORMAL LOW (ref 36.0–46.0)
Hemoglobin: 9.4 g/dL — ABNORMAL LOW (ref 12.0–15.0)
MCH: 25.7 pg — ABNORMAL LOW (ref 26.0–34.0)
MCHC: 30.4 g/dL (ref 30.0–36.0)
MCV: 84.4 fL (ref 80.0–100.0)
Platelets: 278 10*3/uL (ref 150–400)
RBC: 3.66 MIL/uL — ABNORMAL LOW (ref 3.87–5.11)
RDW: 17.2 % — ABNORMAL HIGH (ref 11.5–15.5)
WBC: 10.6 10*3/uL — ABNORMAL HIGH (ref 4.0–10.5)
nRBC: 0 % (ref 0.0–0.2)

## 2019-03-02 LAB — HEPARIN LEVEL (UNFRACTIONATED): Heparin Unfractionated: 1.02 IU/mL — ABNORMAL HIGH (ref 0.30–0.70)

## 2019-03-02 LAB — PROTIME-INR
INR: 1.8 — ABNORMAL HIGH (ref 0.8–1.2)
Prothrombin Time: 20.3 seconds — ABNORMAL HIGH (ref 11.4–15.2)

## 2019-03-02 LAB — APTT
aPTT: 52 seconds — ABNORMAL HIGH (ref 24–36)
aPTT: 61 seconds — ABNORMAL HIGH (ref 24–36)

## 2019-03-02 LAB — PHOSPHORUS: Phosphorus: 2.3 mg/dL — ABNORMAL LOW (ref 2.5–4.6)

## 2019-03-02 LAB — LACTIC ACID, PLASMA: Lactic Acid, Venous: 1.3 mmol/L (ref 0.5–1.9)

## 2019-03-02 LAB — MAGNESIUM: Magnesium: 1.8 mg/dL (ref 1.7–2.4)

## 2019-03-02 LAB — PROCALCITONIN: Procalcitonin: 0.49 ng/mL

## 2019-03-02 MED ORDER — MAGNESIUM SULFATE 2 GM/50ML IV SOLN
2.0000 g | Freq: Once | INTRAVENOUS | Status: AC
  Administered 2019-03-02: 2 g via INTRAVENOUS
  Filled 2019-03-02: qty 50

## 2019-03-02 MED ORDER — MIDAZOLAM HCL 2 MG/2ML IJ SOLN
3.0000 mg | Freq: Once | INTRAMUSCULAR | Status: AC
  Administered 2019-03-02: 3 mg via INTRAVENOUS
  Filled 2019-03-02: qty 4

## 2019-03-02 MED ORDER — POTASSIUM PHOSPHATES 15 MMOLE/5ML IV SOLN
10.0000 mmol | Freq: Once | INTRAVENOUS | Status: AC
  Administered 2019-03-02: 10 mmol via INTRAVENOUS
  Filled 2019-03-02: qty 3.33

## 2019-03-02 MED ORDER — POTASSIUM CHLORIDE 20 MEQ/15ML (10%) PO SOLN
20.0000 meq | Freq: Once | ORAL | Status: AC
  Administered 2019-03-02: 20 meq via ORAL
  Filled 2019-03-02: qty 15

## 2019-03-02 MED ORDER — POTASSIUM CHLORIDE 10 MEQ/50ML IV SOLN
10.0000 meq | INTRAVENOUS | Status: AC
  Administered 2019-03-02 (×3): 10 meq via INTRAVENOUS
  Filled 2019-03-02 (×3): qty 50

## 2019-03-02 MED ORDER — FENTANYL BOLUS VIA INFUSION
100.0000 ug | Freq: Once | INTRAVENOUS | Status: AC
  Administered 2019-03-02: 100 ug via INTRAVENOUS
  Filled 2019-03-02: qty 100

## 2019-03-02 NOTE — Progress Notes (Signed)
NAME:  Judith [name], MRN:  [id number], DOB:  [birth year], LOS: 7 ADMISSION DATE:  03/05/2019, CONSULTATION DATE:  02/12/2019 REFERRING MD:  Dr. Rayann Heman, CHIEF COMPLAINT:  Resp. Fail.   Brief History   68 y/o with multiple comorbidities, including paroxysmal afib. Admitted in afib w/ RVR with what appears to be developing CHF. Admitted 10/16, became more SOB 10/17, still in AFib despite amiodarone and dilt. Taken for cardioversion but became more hypotensive,  requiring pressor support, intubated. Called for vent management; assistance with pressors   Past Medical History  1) paroxysmal afib 2) CHF 3) COPD(?) 4) Hyperthyroidism 5) IDDM 6) CKD stage III Significant Hospital Events   10/17 cardioversion 10/17 intubation       10/19 Severe vent asynchrony overnight,  Vecuronium x1 on 2 consecutive nights, changed to pressure control 10/20 titrating pressors and inotrope support.  Had been on bicarb gtt this was stopped.  10/21 Hyperglycemic overnight; insulin gtt started. Off norepinephrine, Co-ox 73% on dobutamine. [place] a little better. Back in AF. Decreasing dobutamine to 3. Lasix 80mg   f/b gtt started. Failed SBT attempt.  Consults:  Cardiology PCCM  Procedures:  10/17--aline 10/17 ETT >> 10/22 ( self extubation) 10/17 RIJ central line >> 10/17 cardioversion 10/22 ETT>>>  Significant Diagnostic Tests:  10/18: ULTRASOUND ABDOMEN LIMITED RIGHT UPPER QUADRANT 1. Mild gallbladder wall thickening up to 7 mm, which could be related to incomplete distension. No other sonographic features for acute cholecystitis. 2. Previously seen cholelithiasis not visualized on today's exam. 3. No biliary dilatation or other acute finding.  10/17 TTE: EF  30 to 35%.  global LV hypokinesis with severe inferosptal hypokinesis.  mod AS, mod MR  Micro Data:  COVID neg HIV neg MRSA neg Tracheal Aspirate> GS>> FEW GRAM POSITIVE RODS  RARE GRAM POSITIVE COCCI IN PAIRS  Antimicrobials:  none   Interim  history/subjective:  Re-tubed 10/22 after self extubation CXT 10/22>> Bilateral parenchymal opacities slightly improved , mild edema PCT 0.49 Lactate 1.3 Co Ox 81%, CVP 11-12 Dobutamine at 1 mcg/kg/min>> now off Amio at 60 Lasix gtt at 12 with good diuresis( net negative 395) Improved UO>> creatinine 1.56, BUN 86 Elevated LFT's Remains in a fib, tachycardic, on amiodarone at 60 DCCV planned for 10/23 Objective   Blood pressure 111/75, pulse (!) 110, temperature 99.3 F (37.4 C), temperature source Oral, resp. rate 18, height 5\' 4"  (1.626 m), weight 84.1 kg, SpO2 97 %. CVP:  [12 mmHg] 12 mmHg  Vent Mode: PCV FiO2 (%):  [40 %-50 %] 40 % Set Rate:  [18 bmp] 18 bmp PEEP:  [5 cmH20] 5 cmH20 Plateau Pressure:  [15 cmH20-24 cmH20] 15 cmH20   Intake/Output Summary (Last 24 hours) at 03/02/2019 0957 Last data filed at 03/02/2019 0600 Gross per 24 hour  Intake 1741.27 ml  Output 3350 ml  Net -1608.73 ml   Filed Weights   02/28/19 0500 03/01/19 0500 03/02/19 0500  Weight: 84.4 kg 86.6 kg 84.1 kg    General Appearance:  Looks criticall ill OBESE. Sedated and intubated Head:  Normocephalic, without obvious abnormality, atraumatic, thick neck , + JVD Eyes:  PERRL  conjunctiva/corneas - mudd    Ears:  Normal external ear canals, both ears Throat:  ETT TUBE  Secure and intact, OG tube - secure and intact.  Neck:  Supple,  No enlargement/tenderness/nodules Lungs: Bilateral chest excursion, coarse throughout with rales per bases, no wheezing noted Heart:  S1 and S2 normal, no murmur, CVP - 11-12.  Pressors - off  Abdomen:  Soft, no masses, no organomegaly, NT. ND, BS + Extremities:  Righ above the knee amputation, L foot cool to touch with refill > 3 seconds Skin:  Intact, cool to touch, no rash or lesions Neurologic:  Remains on fentabyl at 100, prn versed RASS - 2      Resolved Hospital Problem list   Cardiogenic shock Metabolic acidosis Assessment & Plan:  Multiple  comorbidities including Afib, cardioverted with subsequent cardiogenic shock and acute respiratory failure   Acute respiratory failure in setting of pulmonary edema and cardiogenic shock - Severe vent asynchrony requiring paralytic 10/19 & 10/20,  Former smoker ? Underlying COPD  03/02/2019 - reintubation 10/22  and does not meet criteria for extubatin or SBT. Has pulmonary infiltrates. Need to rule out VAP given mucus presence at time of reintubation  Plan PRVC Full support for now IV diuresis per card VAP bundle Follow micro Trend CXR ABG in am and prn SBT when able RASS -2 for now  Infiltrates per CXR 10/22 No fever Mild elevation of WBC Plan CXR in am and prn Follow PCT 0.49 on 10/23 Consider antimicrobials with fever/ increase in WBC Re-culture as is clinically indicated   Acute systolic heart failure Cardiogenic shock  03/02/2019 - dobutamine gtt off, Lasix gtt at 12 mg/ hr CVP 11-12  Plan Trend BNP Strict I&O Lasix gtt per cards Per CHF Team ( Appreciate assist)   Atrial fibrillation with RVR-status post cardioversion HR remain 105-130 Plan Continuing amiodarone and heparin per cardiology service EKG prn Mag Goal > 2.0 Potassium goal > 4 DCCV planned for 10/23 now pt is off dobutamine   AKI -likely cardiorenal plus diuretic induced, underlying CKD stage III, Creatinine trending down, at 1.5 Trend BMET Monitor strict I&O >> Goal is net negative balance Monitor and replete electrolytes as needed   Fluid and electrolyte imbalance: Mild hyponatremia-diuretic induced Plan Serial chemistries  Shock liver-improving, follow LFTs intermittently Plan Intermittent LFTs Statin on hold Main[place]on CO and MAP with hopes of improvement  IDDM: Uncontrolled due to steroids Off insulin gtt 10/23 Plan CBG Q 4 SSI  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 Plan  Methimazole was  resumed 10/19    Best practice:  Diet: NPO Pain/Anxiety/Delirium protocol (if indicated): Fentanyl drip, intermittent Versed VAP protocol (if indicated): in place DVT prophylaxis: heparin, SCDs GI prophylaxis: per protocol Glucose control: SSI Mobility: bed Code Status: full Family Communication: No family at bedside Disposition: ICU   Magdalen Spatz, MSN, AGACNP-BC [place] Pager # [phone] After 4 pm please call [phone] 03/02/2019 9:57 AM    LABS    PULMONARY Recent Labs  Lab 03/10/2019 1931  02/10/2019 2228  02/27/19 0322  02/27/19 0501 02/28/19 0456 02/28/19 1200 02/28/19 1247 03/01/19 0405 03/02/19 0414  PHART 7.365  --  7.429  --  7.322*  --  7.395  --   --  7.412  --   --   PCO2ART 44.6  --  40.1  --  55.4*  --  44.8  --   --  49.9*  --   --   PO2ART 112.0*  --  116.0*  --  116.0*  --  115.0*  --   --  65.0*  --   --   HCO3 25.6  --  26.6  --  28.8*  --  27.5  --   --  31.8*  --   --   TCO2  27  --  28  --  30  --  29  --   --  33*  --   --   O2SAT 98.0   < > 99.0   < > 98.0   < > 98.0 72.9 61.3 92.0 64.5 81.2   < > = values in this interval not displayed.    CBC Recent Labs  Lab 02/28/19 0455 02/28/19 1247 03/01/19 0358 03/02/19 0421  HGB 9.8* 11.2* 10.3* 9.4*  HCT 31.6* 33.0* 33.0* 30.9*  WBC 11.6*  --  13.0* 10.6*  PLT 337  --  322 278    COAGULATION Recent Labs  Lab 03/08/2019 1644 03/04/2019 0331 02/13/2019 1712 02/25/19 0250 03/02/19 0421  INR 2.0* 3.2* 6.5* 5.4* 1.8*    CARDIAC  No results for input(s): TROPONINI in the last 168 hours. No results for input(s): PROBNP in the last 168 hours.   CHEMISTRY Recent Labs  Lab 02/23/2019 1738  02/19/2019 1712  02/26/19 0308  02/27/19 [id number] 02/27/19 0501 02/28/19 0455 02/28/19 1247 03/01/19 0358 03/02/19 0421  NA  --    < > 129*   < > 132*   < > 130* 131* 130* 130* 132* 130*  K  --    < > 5.1   < > 4.8   < > 4.0 3.9 3.8 3.7 3.4* 3.7  CL  --    <  > 92*   < > 91*  --  89*  --  90*  --  87* 85*  CO2  --    < > 17*   < > 27  --  27  --  27  --  32 33*  GLUCOSE  --    < > 87   < > 137*  --  264*  --  241*  --  261* 187*  BUN  --    < > 39*   < > 61*  --  80*  --  96*  --  92* 86*  CREATININE  --    < > 2.29*   < > 2.49*  --  2.65*  --  2.32*  --  1.87* 1.56*  CALCIUM  --    < > 8.6*   < > 8.5*  --  8.5*  --  8.5*  --  9.1 8.8*  MG 1.9  --  2.0  --   --   --  2.3  --   --   --   --  1.8  PHOS  --   --  5.7*  --   --   --   --   --   --   --   --  2.3*   < > = values in this interval not displayed.   Estimated Creatinine Clearance: 37.2 mL/min (A) (by C-G formula based on SCr of 1.56 mg/dL (H)).   LIVER Recent Labs  Lab 02/10/2019 1644 03/01/2019 0331  02/24/19 1712 02/25/19 0250 02/26/19 0308 02/27/19 0353 02/28/19 0455 03/01/19 0358 03/02/19 0421  AST  --   --    < >  --  3,843* 1,969* 675* 220* 105* 64*  ALT  --   --    < >  --  1,306* 1,269* 832* 556* 355* 201*  ALKPHOS  --   --    < >  --  151* 142* 130* 131* 138* 123  BILITOT  --   --    < >  --  1.7* 1.4*  1.5* 0.9 1.1 1.1  PROT  --   --    < >  --  6.1* 5.9* 5.7* 5.6* 6.2* 5.7*  ALBUMIN  --   --    < >  --  2.9* 2.6* 2.6* 2.4* 2.5* 2.1*  INR 2.0* 3.2*  --  6.5* 5.4*  --   --   --   --  1.8*   < > = values in this interval not displayed.     INFECTIOUS Recent Labs  Lab 02/25/19 2012 02/26/19 0902 03/01/19 0943 03/02/19 0421  LATICACIDVEN 2.3* 1.2  --  1.3  PROCALCITON  --   --  0.45 0.49     ENDOCRINE CBG (last 3)  Recent Labs    03/01/19 2353 03/02/19 0406 03/02/19 0736  GLUCAP 164* 183* 182*         IMAGING x48h  - image(s) personally visualized  -   highlighted in bold Dg Chest Port 1 View  Result Date: 03/01/2019 CLINICAL DATA:  Check endotracheal tube placement EXAM: PORTABLE CHEST 1 VIEW COMPARISON:  03/01/2019 FINDINGS: Endotracheal tube is again identified 3.5 cm above the carina. Gastric catheter extends into the stomach. Right jugular  central line is noted at the cavoatrial junction. The lungs are hypoinflated with diffuse bilateral opacities likely representing edema mildly increased from the prior exam. No bony abnormality is noted. IMPRESSION: Bilateral parenchymal opacities slightly improved from the prior exam. Tubes and lines as described above in satisfactory position. Electronically Signed   By: Inez Catalina M.D.   On: 03/01/2019 09:17   Dg Chest Port 1 View  Result Date: 03/01/2019 CLINICAL DATA:  Respiratory failure. EXAM: PORTABLE CHEST 1 VIEW COMPARISON:  February 27, 2019. FINDINGS: Stable cardiomegaly. Endotracheal and nasogastric tubes are unchanged in position. Right internal jugular catheter is unchanged. No pneumothorax or pleural effusion is noted. Increased bilateral lung opacities are noted concerning for worsening edema or pneumonia. Bony thorax is unremarkable. IMPRESSION: Stable support apparatus. Increased bilateral lung opacities as described above. Electronically Signed   By: Marijo Conception M.D.   On: 03/01/2019 07:31   Dg Abd Portable 1v  Result Date: 03/01/2019 CLINICAL DATA:  Check gastric catheter placement EXAM: PORTABLE ABDOMEN - 1 VIEW COMPARISON:  02/23/2018 FINDINGS: Gastric catheter is noted extending into the distal stomach. Scattered large and small bowel gas is noted. No free air is seen. IMPRESSION: Gastric catheter within the stomach. Electronically Signed   By: Inez Catalina M.D.   On: 03/01/2019 09:17

## 2019-03-02 NOTE — Progress Notes (Addendum)
Patient ID: Judith [name], female   DOB: [birth year], 68 y.o.   MRN: [id number]     Advanced Heart Failure Rounding Note  PCP-Cardiologist: Fransico Him, MD   Subjective:    Patient went back into atrial fibrillation with RVR 10/20, amiodarone increased to 60 mg/hr. HR 110s today.  She failed DCCV yesterday but was on dobutamine 2.   Remains on dobutamine now down to 1 mcg/kg/min and off norepinephrine.  MAP has been stable, co-ox 81%.  CVP 11-12 this morning.  UOP better yesterday, renal indices improving.    She is following commands.    Objective:   Weight Range: 84.1 kg Body mass index is 31.83 kg/m.   Vital Signs:   Temp:  [98.2 F (36.8 C)-99.3 F (37.4 C)] 99.3 F (37.4 C) (10/23 0300) Pulse Rate:  [104-134] 126 (10/23 0700) Resp:  [13-27] 18 (10/23 0700) BP: (87-135)/(70-97) 104/78 (10/23 0700) SpO2:  [90 %-100 %] 99 % (10/23 0700) FiO2 (%):  [40 %-50 %] 40 % (10/23 0453) Weight:  [84.1 kg] 84.1 kg (10/23 0500) Last BM Date: 02/21/19  Weight change: Filed Weights   02/28/19 0500 03/01/19 0500 03/02/19 0500  Weight: 84.4 kg 86.6 kg 84.1 kg    Intake/Output:   Intake/Output Summary (Last 24 hours) at 03/02/2019 0744 Last data filed at 03/02/2019 0600 Gross per 24 hour  Intake 1871.03 ml  Output 3700 ml  Net -1828.97 ml      Physical Exam   General: NAD, on vent Neck: thick, JVP 10 cm, no thyromegaly or thyroid nodule.  Lungs: Decreased at bases.  CV: Nonpalpable PMI.  Heart tachy, irregular S1/S2, no S3/S4, 2/6 SEM RUSB.  Trace ankle edema.   Abdomen: Soft, nontender, no hepatosplenomegaly, no distention.  Skin: Intact without lesions or rashes.  Neurologic: Follows commands. .  Extremities: No clubbing or cyanosis.  HEENT: Normal.    Telemetry   Atrial fibrillation with RVR 110s (personally reviewed)   Labs    CBC Recent Labs    03/01/19 0358 03/02/19 0421  WBC 13.0* 10.6*  HGB 10.3* 9.4*  HCT 33.0* 30.9*  MCV 84.4 84.4  PLT 322 976    Basic Metabolic Panel Recent Labs    03/01/19 0358 03/02/19 0421  NA 132* 130*  K 3.4* 3.7  CL 87* 85*  CO2 32 33*  GLUCOSE 261* 187*  BUN 92* 86*  CREATININE 1.87* 1.56*  CALCIUM 9.1 8.8*  MG  --  1.8  PHOS  --  2.3*   Liver Function Tests Recent Labs    03/01/19 0358 03/02/19 0421  AST 105* 64*  ALT 355* 201*  ALKPHOS 138* 123  BILITOT 1.1 1.1  PROT 6.2* 5.7*  ALBUMIN 2.5* 2.1*   No results for input(s): LIPASE, AMYLASE in the last 72 hours. Cardiac Enzymes No results for input(s): CKTOTAL, CKMB, CKMBINDEX, TROPONINI in the last 72 hours.  BNP: BNP (last 3 results) Recent Labs    02/12/2019 1459  BNP 1,119.0*    ProBNP (last 3 results) Recent Labs    10/13/18 1057 01/18/19 1445  PROBNP 2,908* 2,645*     D-Dimer No results for input(s): DDIMER in the last 72 hours. Hemoglobin A1C No results for input(s): HGBA1C in the last 72 hours. Fasting Lipid Panel No results for input(s): CHOL, HDL, LDLCALC, TRIG, CHOLHDL, LDLDIRECT in the last 72 hours. Thyroid Function Tests No results for input(s): TSH, T4TOTAL, T3FREE, THYROIDAB in the last 72 hours.  Invalid input(s): FREET3  Other results:  Imaging    Dg Chest Port 1 View  Result Date: 03/01/2019 CLINICAL DATA:  Check endotracheal tube placement EXAM: PORTABLE CHEST 1 VIEW COMPARISON:  03/01/2019 FINDINGS: Endotracheal tube is again identified 3.5 cm above the carina. Gastric catheter extends into the stomach. Right jugular central line is noted at the cavoatrial junction. The lungs are hypoinflated with diffuse bilateral opacities likely representing edema mildly increased from the prior exam. No bony abnormality is noted. IMPRESSION: Bilateral parenchymal opacities slightly improved from the prior exam. Tubes and lines as described above in satisfactory position. Electronically Signed   By: Inez Catalina M.D.   On: 03/01/2019 09:17   Dg Abd Portable 1v  Result Date: 03/01/2019 CLINICAL DATA:   Check gastric catheter placement EXAM: PORTABLE ABDOMEN - 1 VIEW COMPARISON:  02/23/2018 FINDINGS: Gastric catheter is noted extending into the distal stomach. Scattered large and small bowel gas is noted. No free air is seen. IMPRESSION: Gastric catheter within the stomach. Electronically Signed   By: Inez Catalina M.D.   On: 03/01/2019 09:17     Medications:     Scheduled Medications: . atorvastatin  20 mg Per Tube Daily  . budesonide (PULMICORT) nebulizer solution  0.25 mg Nebulization BID  . buPROPion  75 mg Per Tube BID  . chlorhexidine gluconate (MEDLINE KIT)  15 mL Mouth Rinse BID  . Chlorhexidine Gluconate Cloth  6 each Topical Daily  . famotidine  20 mg Per Tube Daily  . ferrous sulfate  300 mg Per Tube Daily  . FLUoxetine  20 mg Per Tube QHS  . insulin aspart  1 Units Subcutaneous Q4H  . insulin aspart  2-6 Units Subcutaneous Q4H  . insulin detemir  15 Units Subcutaneous BID  . loratadine  10 mg Per Tube QHS  . magnesium oxide  400 mg Per Tube QHS  . mouth rinse  15 mL Mouth Rinse 10 times per day  . methimazole  10 mg Per Tube BID  . sodium chloride flush  10-40 mL Intracatheter Q12H    Infusions: . sodium chloride 250 mL (03/01/19 1138)  . amiodarone 60 mg/hr (03/02/19 0600)  . feeding supplement (VITAL HIGH PROTEIN) 1,000 mL (03/01/19 1530)  . fentaNYL infusion INTRAVENOUS 150 mcg/hr (03/02/19 7035)  . furosemide (LASIX) infusion 12 mg/hr (03/02/19 0600)  . heparin 1,100 Units/hr (03/02/19 0600)  . insulin Stopped (03/01/19 1511)  . norepinephrine (LEVOPHED) Adult infusion Stopped (02/27/19 1321)    PRN Medications: sodium chloride, acetaminophen, albuterol, bisacodyl, bisacodyl, docusate, docusate, fentaNYL, fentaNYL, Melatonin, midazolam, midazolam, nitroGLYCERIN, ondansetron (ZOFRAN) IV, polyethylene glycol, sodium chloride flush, triamcinolone   Assessment/Plan   1. Acute on chronic systolic CHF/cardiogenic shock: Echo this admission with new fall in EF,  30-35% with diffuse hypokinesis worse in the inferoseptal wall, severe LAE. Possible tachy-mediated CMP as she was in rapid atypical atrial flutter rate 150s at admission.  She converted to NSR but now is back in atrial fibrillation.  HR in 120s today. Co-ox 81% this morning with CVP at 11-12, UOP picking up.  Creatinine lower but BUN higher. She remains on dobutamine 1 but is off norepinephrine.  - Stop dobutamine today.    - Continue Lasix gtt at 12 mg/hr, may be able to transition to po/per tube tomorrow.  - Would ideally have workup for coronary disease as cause of cardiomyopathy (has known prior CAD), but would hold off until creatinine comes down and stabilizes.  2. Atypical atrial flutter/atrial fibrillation: Patient has had both atrial fibrillation and atypical flutter.  Admitted with atypical flutter/RVR in 150s.  She had emergent DCCV initially and remained in NSR for several days but back in persistent afib 10/20.  She has severe LAE and moderate RAE. She has been seen by Dr. Rayann Heman.  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 She is not a good ablation candidate given the size of the atria though could consider if she holds NSR for a month or two.  Rate is still not controlled. Failed DCCV yesterday but was still on dobutamine.  - Continue amiodarone gtt at 60 mg/hr and will stop dobutamine today.  Tube feeds on hold and will plan DCCV later today now that she is off dobutamine.  Discussed with family. - Eventual option will be AV nodal ablation/BiV pacing if we cannot cardiovert her and she stabilizes/extubates.   - Continue heparin gtt (stopped apixaban).   3. Liver failure: Markedly elevated transaminases.  Suspect shock liver from hypotension/shock.  LFTs now trending down.  - Maintain MAP and CO, should improve over time.  - Stopped statin for now.  4. CAD: Known CTO  RCA, prior DES to LCx/OM in 2016.  Peak hs-TnI 548.  Most likely demand ischemia from shock.  - Holding statin with liver failure, restart as LFTs trend down.  - As above, would consider coronary angiography once creatinine comes down.  5. Hyperthyroidism: Thyrotoxicosis likely helps drive the atrial fibrillation/flutter.  This may have been triggered by chronic amiodarone use.  Unfortunately, she does not have a lot of options right now and will need to continue amiodarone.  - She is now off steroids.   - Continue methimazole.  6. AKI: On CKD stage 3.  Suspect due to shock. Creatinine improving, UOP better.  7. Aortic stenosis: Probably moderate.  8. Acute hypoxemic respiratory failure: Suspected pulmonary edema, but based on behavior on vent, ?underlying COPD.  She is a prior smoker. CXR today with infiltrates, no fever and WBCs mildly elevated.  Self-extubated today and re-intubated. Afebrile, PCT 0.45=>0.49.   - Per CCM.  - Trach aspirate culture results pending.    CRITICAL CARE Performed by: Loralie Champagne  Total critical care time: 40 minutes  Critical care time was exclusive of separately billable procedures and treating other patients.  Critical care was necessary to treat or prevent imminent or life-threatening deterioration.  Critical care was time spent personally by me on the following activities: development of treatment plan with patient and/or surrogate as well as nursing, discussions with consultants, evaluation of patient's response to treatment, examination of patient, obtaining history from patient or surrogate, ordering and performing treatments and interventions, ordering and review of laboratory studies, ordering and review of radiographic studies, pulse oximetry and re-evaluation of patient's condition.   Length of Stay: 7  Loralie Champagne, MD  03/02/2019, 7:44 AM  Advanced Heart Failure Team Pager [phone] (M-F; 7a - 4p)  Please contact [facility] for  night-coverage after hours (4p -7a ) and weekends on [email or web address]

## 2019-03-02 NOTE — Progress Notes (Signed)
[facility] for Heparin  Indication: atrial fibrillation  Allergies  Allergen Reactions  . Contrast Media [Iodinated Diagnostic Agents] Hives and Other (See Comments)    Spoke to patient, Iodine allergy is really IV contrast allergy.   [name] [Insulin Aspart] Shortness Of Breath and Other (See Comments)    "breathing problems"  . Codeine Nausea And Vomiting and Other (See Comments)    HIGH DOSES-SEVERE VOMITING  . Iodine Other (See Comments)    MUST HAVE BENADRYL PRIOR TO PROCEDURE AND RIGHT BEFORE TREATMENT TO COUNTERACT REACTION-BLISTERING REACTION DERMATOLOGICAL  . Penicillins Itching, Rash and Other (See Comments)    Has patient had a PCN reaction causing immediate rash, facial/tongue/throat swelling, SOB or lightheadedness with hypotension: no Has patient had a PCN reaction causing severe rash involving mucus membranes or skin necrosis: No Has patient had a PCN reaction that required hospitalization No Has patient had a PCN reaction occurring within the last 10 years: No If all of the above answers are "NO", then may proceed with Cephalosporin use.  CHEST SIZED RASH AND ITCHING   . Ace Inhibitors Cough  . Demerol [Meperidine] Nausea And Vomiting  . Dilaudid [Hydromorphone Hcl] Other (See Comments)    HEADACHE   . Neosporin [Neomycin-Bacitracin Zn-Polymyx] Itching, Rash and Other (See Comments)    MAKES REACTIONS WORSE WHEN USING AS PROPHYLACTIC  . Percocet [Oxycodone-Acetaminophen] Rash  . Tape Itching and Rash    Patient Measurements: Height: 5\' 4"  (162.6 cm) Weight: 185 lb 6.5 oz (84.1 kg) IBW/kg (Calculated) : 54.7 Heparin Dosing Weight: 75kg  Vital Signs: Temp: 99.7 F (37.6 C) (10/23 1600) Temp Source: Oral (10/23 1600) BP: 110/68 (10/23 1700) Pulse Rate: 74 (10/23 1700)  Labs: Recent Labs    02/28/19 0455 02/28/19 1247 03/01/19 0358 03/01/19 1518 03/02/19 0421 03/02/19 0422 03/02/19 1644  HGB 9.8* 11.2* 10.3*   --  9.4*  --   --   HCT 31.6* 33.0* 33.0*  --  30.9*  --   --   PLT 337  --  322  --  278  --   --   APTT 70*  --  54* 83* 52*  --  61*  LABPROT  --   --   --   --  20.3*  --   --   INR  --   --   --   --  1.8*  --   --   HEPARINUNFRC  --   --  1.66*  --   --  1.02*  --   CREATININE 2.32*  --  1.87*  --  1.56*  --   --     Estimated Creatinine Clearance: 37.2 mL/min (A) (by C-G formula based on SCr of 1.56 mg/dL (H)).  Assessment: 68yof admitted for SOB in setting of Afib RVR was supposed to have DCCV but deferred because patient missed doses of apixaban prior. She developed hypotension, cardiogenic shock required DCCV on 10/17.Previously on apixaban last dose 10/17 pm. -aPTT= 61  Goal of Therapy:  Heparin level 0.3-0.7 aPTT aptt 66-90 sec seconds Monitor platelets by anticoagulation protocol: Yes   Plan:  -increase heparin to 1250 units/hr -aPTT, heparin level and CBC in am  Hildred Laser, PharmD Clinical Pharmacist **Pharmacist phone directory can now be found on [email or web address] ([email or web address]).  Listed under [facility].

## 2019-03-02 NOTE — Progress Notes (Signed)
[facility] for Heparin  Indication: atrial fibrillation  Allergies  Allergen Reactions  . Contrast Media [Iodinated Diagnostic Agents] Hives and Other (See Comments)    Spoke to patient, Iodine allergy is really IV contrast allergy.   [name] [Insulin Aspart] Shortness Of Breath and Other (See Comments)    "breathing problems"  . Codeine Nausea And Vomiting and Other (See Comments)    HIGH DOSES-SEVERE VOMITING  . Iodine Other (See Comments)    MUST HAVE BENADRYL PRIOR TO PROCEDURE AND RIGHT BEFORE TREATMENT TO COUNTERACT REACTION-BLISTERING REACTION DERMATOLOGICAL  . Penicillins Itching, Rash and Other (See Comments)    Has patient had a PCN reaction causing immediate rash, facial/tongue/throat swelling, SOB or lightheadedness with hypotension: no Has patient had a PCN reaction causing severe rash involving mucus membranes or skin necrosis: No Has patient had a PCN reaction that required hospitalization No Has patient had a PCN reaction occurring within the last 10 years: No If all of the above answers are "NO", then may proceed with Cephalosporin use.  CHEST SIZED RASH AND ITCHING   . Ace Inhibitors Cough  . Demerol [Meperidine] Nausea And Vomiting  . Dilaudid [Hydromorphone Hcl] Other (See Comments)    HEADACHE   . Neosporin [Neomycin-Bacitracin Zn-Polymyx] Itching, Rash and Other (See Comments)    MAKES REACTIONS WORSE WHEN USING AS PROPHYLACTIC  . Percocet [Oxycodone-Acetaminophen] Rash  . Tape Itching and Rash    Patient Measurements: Height: 5\' 4"  (162.6 cm) Weight: 185 lb 6.5 oz (84.1 kg) IBW/kg (Calculated) : 54.7 Heparin Dosing Weight: 75kg  Vital Signs: Temp: 99.3 F (37.4 C) (10/23 0300) Temp Source: Oral (10/23 0300) BP: 104/78 (10/23 0700) Pulse Rate: 126 (10/23 0700)  Labs: Recent Labs    02/28/19 0455 02/28/19 1247 03/01/19 0358 03/01/19 1518 03/02/19 0421 03/02/19 0422  HGB 9.8* 11.2* 10.3*  --  9.4*  --    HCT 31.6* 33.0* 33.0*  --  30.9*  --   PLT 337  --  322  --  278  --   APTT 70*  --  54* 83* 52*  --   LABPROT  --   --   --   --  20.3*  --   INR  --   --   --   --  1.8*  --   HEPARINUNFRC  --   --  1.66*  --   --  1.02*  CREATININE 2.32*  --  1.87*  --  1.56*  --     Estimated Creatinine Clearance: 37.2 mL/min (A) (by C-G formula based on SCr of 1.56 mg/dL (H)).  Assessment: 68yof admitted for SOB in setting of Afib RVR was supposed to have DCCV but deferred because patient missed doses of apixaban prior. She developed hypotension, cardiogenic shock required DCCV on 10/17.  Previously on apixaban last dose 10/17 pm. -aPTT below goal 52 sec -heparin level remains falsely elevated from DOAC use at 1.02 -hgb stable 9.4, pltc wnl  Goal of Therapy:  Heparin level 0.3-0.7 aPTT aptt 66-90 sec seconds Monitor platelets by anticoagulation protocol: Yes   Plan:  -Increase IV heparin to 1150 units/hr -Check 8 hr aPTT -Heparin level, aPTT and CBC daily -Monitor for bleeding  Vertis Kelch, PharmD PGY2 Cardiology Pharmacy Resident Phone [phone] 03/02/2019       7:45 AM  Please check [email or web address] for unit-specific pharmacist phone numbers

## 2019-03-02 NOTE — H&P (View-Only) (Signed)
Patient ID: Judith [name], female   DOB: 1951-02-01, 68 y.o.   MRN: [id number]     Advanced Heart Failure Rounding Note  PCP-Cardiologist: Fransico Him, MD   Subjective:    Patient went back into atrial fibrillation with RVR 10/20, amiodarone increased to 60 mg/hr. HR 110s today.  She failed DCCV yesterday but was on dobutamine 2.   Remains on dobutamine now down to 1 mcg/kg/min and off norepinephrine.  MAP has been stable, co-ox 81%.  CVP 11-12 this morning.  UOP better yesterday, renal indices improving.    She is following commands.    Objective:   Weight Range: 84.1 kg Body mass index is 31.83 kg/m.   Vital Signs:   Temp:  [98.2 F (36.8 C)-99.3 F (37.4 C)] 99.3 F (37.4 C) (10/23 0300) Pulse Rate:  [104-134] 126 (10/23 0700) Resp:  [13-27] 18 (10/23 0700) BP: (87-135)/(70-97) 104/78 (10/23 0700) SpO2:  [90 %-100 %] 99 % (10/23 0700) FiO2 (%):  [40 %-50 %] 40 % (10/23 0453) Weight:  [84.1 kg] 84.1 kg (10/23 0500) Last BM Date: 02/21/19  Weight change: Filed Weights   02/28/19 0500 03/01/19 0500 03/02/19 0500  Weight: 84.4 kg 86.6 kg 84.1 kg    Intake/Output:   Intake/Output Summary (Last 24 hours) at 03/02/2019 0744 Last data filed at 03/02/2019 0600 Gross per 24 hour  Intake 1871.03 ml  Output 3700 ml  Net -1828.97 ml      Physical Exam   General: NAD, on vent Neck: thick, JVP 10 cm, no thyromegaly or thyroid nodule.  Lungs: Decreased at bases.  CV: Nonpalpable PMI.  Heart tachy, irregular S1/S2, no S3/S4, 2/6 SEM RUSB.  Trace ankle edema.   Abdomen: Soft, nontender, no hepatosplenomegaly, no distention.  Skin: Intact without lesions or rashes.  Neurologic: Follows commands. .  Extremities: No clubbing or cyanosis.  HEENT: Normal.    Telemetry   Atrial fibrillation with RVR 110s (personally reviewed)   Labs    CBC Recent Labs    03/01/19 0358 03/02/19 0421  WBC 13.0* 10.6*  HGB 10.3* 9.4*  HCT 33.0* 30.9*  MCV 84.4 84.4  PLT 322 329    Basic Metabolic Panel Recent Labs    03/01/19 0358 03/02/19 0421  NA 132* 130*  K 3.4* 3.7  CL 87* 85*  CO2 32 33*  GLUCOSE 261* 187*  BUN 92* 86*  CREATININE 1.87* 1.56*  CALCIUM 9.1 8.8*  MG  --  1.8  PHOS  --  2.3*   Liver Function Tests Recent Labs    03/01/19 0358 03/02/19 0421  AST 105* 64*  ALT 355* 201*  ALKPHOS 138* 123  BILITOT 1.1 1.1  PROT 6.2* 5.7*  ALBUMIN 2.5* 2.1*   No results for input(s): LIPASE, AMYLASE in the last 72 hours. Cardiac Enzymes No results for input(s): CKTOTAL, CKMB, CKMBINDEX, TROPONINI in the last 72 hours.  BNP: BNP (last 3 results) Recent Labs    02/08/2019 1459  BNP 1,119.0*    ProBNP (last 3 results) Recent Labs    10/13/18 1057 01/18/19 1445  PROBNP 2,908* 2,645*     D-Dimer No results for input(s): DDIMER in the last 72 hours. Hemoglobin A1C No results for input(s): HGBA1C in the last 72 hours. Fasting Lipid Panel No results for input(s): CHOL, HDL, LDLCALC, TRIG, CHOLHDL, LDLDIRECT in the last 72 hours. Thyroid Function Tests No results for input(s): TSH, T4TOTAL, T3FREE, THYROIDAB in the last 72 hours.  Invalid input(s): FREET3  Other results:  Imaging    Dg Chest Port 1 View  Result Date: 03/01/2019 CLINICAL DATA:  Check endotracheal tube placement EXAM: PORTABLE CHEST 1 VIEW COMPARISON:  03/01/2019 FINDINGS: Endotracheal tube is again identified 3.5 cm above the carina. Gastric catheter extends into the stomach. Right jugular central line is noted at the cavoatrial junction. The lungs are hypoinflated with diffuse bilateral opacities likely representing edema mildly increased from the prior exam. No bony abnormality is noted. IMPRESSION: Bilateral parenchymal opacities slightly improved from the prior exam. Tubes and lines as described above in satisfactory position. Electronically Signed   By: Inez Catalina M.D.   On: 03/01/2019 09:17   Dg Abd Portable 1v  Result Date: 03/01/2019 CLINICAL DATA:   Check gastric catheter placement EXAM: PORTABLE ABDOMEN - 1 VIEW COMPARISON:  02/23/2018 FINDINGS: Gastric catheter is noted extending into the distal stomach. Scattered large and small bowel gas is noted. No free air is seen. IMPRESSION: Gastric catheter within the stomach. Electronically Signed   By: Inez Catalina M.D.   On: 03/01/2019 09:17     Medications:     Scheduled Medications: . atorvastatin  20 mg Per Tube Daily  . budesonide (PULMICORT) nebulizer solution  0.25 mg Nebulization BID  . buPROPion  75 mg Per Tube BID  . chlorhexidine gluconate (MEDLINE KIT)  15 mL Mouth Rinse BID  . Chlorhexidine Gluconate Cloth  6 each Topical Daily  . famotidine  20 mg Per Tube Daily  . ferrous sulfate  300 mg Per Tube Daily  . FLUoxetine  20 mg Per Tube QHS  . insulin aspart  1 Units Subcutaneous Q4H  . insulin aspart  2-6 Units Subcutaneous Q4H  . insulin detemir  15 Units Subcutaneous BID  . loratadine  10 mg Per Tube QHS  . magnesium oxide  400 mg Per Tube QHS  . mouth rinse  15 mL Mouth Rinse 10 times per day  . methimazole  10 mg Per Tube BID  . sodium chloride flush  10-40 mL Intracatheter Q12H    Infusions: . sodium chloride 250 mL (03/01/19 1138)  . amiodarone 60 mg/hr (03/02/19 0600)  . feeding supplement (VITAL HIGH PROTEIN) 1,000 mL (03/01/19 1530)  . fentaNYL infusion INTRAVENOUS 150 mcg/hr (03/02/19 0539)  . furosemide (LASIX) infusion 12 mg/hr (03/02/19 0600)  . heparin 1,100 Units/hr (03/02/19 0600)  . insulin Stopped (03/01/19 1511)  . norepinephrine (LEVOPHED) Adult infusion Stopped (02/27/19 1321)    PRN Medications: sodium chloride, acetaminophen, albuterol, bisacodyl, bisacodyl, docusate, docusate, fentaNYL, fentaNYL, Melatonin, midazolam, midazolam, nitroGLYCERIN, ondansetron (ZOFRAN) IV, polyethylene glycol, sodium chloride flush, triamcinolone   Assessment/Plan   1. Acute on chronic systolic CHF/cardiogenic shock: Echo this admission with new fall in EF,  30-35% with diffuse hypokinesis worse in the inferoseptal wall, severe LAE. Possible tachy-mediated CMP as she was in rapid atypical atrial flutter rate 150s at admission.  She converted to NSR but now is back in atrial fibrillation.  HR in 120s today. Co-ox 81% this morning with CVP at 11-12, UOP picking up.  Creatinine lower but BUN higher. She remains on dobutamine 1 but is off norepinephrine.  - Stop dobutamine today.    - Continue Lasix gtt at 12 mg/hr, may be able to transition to po/per tube tomorrow.  - Would ideally have workup for coronary disease as cause of cardiomyopathy (has known prior CAD), but would hold off until creatinine comes down and stabilizes.  2. Atypical atrial flutter/atrial fibrillation: Patient has had both atrial fibrillation and atypical flutter.  Admitted with atypical flutter/RVR in 150s.  She had emergent DCCV initially and remained in NSR for several days but back in persistent afib 10/20.  She has severe LAE and moderate RAE. She has been seen by Dr. Rayann Heman.  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 She is not a good ablation candidate given the size of the atria though could consider if she holds NSR for a month or two.  Rate is still not controlled. Failed DCCV yesterday but was still on dobutamine.  - Continue amiodarone gtt at 60 mg/hr and will stop dobutamine today.  Tube feeds on hold and will plan DCCV later today now that she is off dobutamine.  Discussed with family. - Eventual option will be AV nodal ablation/BiV pacing if we cannot cardiovert her and she stabilizes/extubates.   - Continue heparin gtt (stopped apixaban).   3. Liver failure: Markedly elevated transaminases.  Suspect shock liver from hypotension/shock.  LFTs now trending down.  - Maintain MAP and CO, should improve over time.  - Stopped statin for now.  4. CAD: Known CTO  RCA, prior DES to LCx/OM in 2016.  Peak hs-TnI 548.  Most likely demand ischemia from shock.  - Holding statin with liver failure, restart as LFTs trend down.  - As above, would consider coronary angiography once creatinine comes down.  5. Hyperthyroidism: Thyrotoxicosis likely helps drive the atrial fibrillation/flutter.  This may have been triggered by chronic amiodarone use.  Unfortunately, she does not have a lot of options right now and will need to continue amiodarone.  - She is now off steroids.   - Continue methimazole.  6. AKI: On CKD stage 3.  Suspect due to shock. Creatinine improving, UOP better.  7. Aortic stenosis: Probably moderate.  8. Acute hypoxemic respiratory failure: Suspected pulmonary edema, but based on behavior on vent, ?underlying COPD.  She is a prior smoker. CXR today with infiltrates, no fever and WBCs mildly elevated.  Self-extubated today and re-intubated. Afebrile, PCT 0.45=>0.49.   - Per CCM.  - Trach aspirate culture results pending.    CRITICAL CARE Performed by: Loralie Champagne  Total critical care time: 40 minutes  Critical care time was exclusive of separately billable procedures and treating other patients.  Critical care was necessary to treat or prevent imminent or life-threatening deterioration.  Critical care was time spent personally by me on the following activities: development of treatment plan with patient and/or surrogate as well as nursing, discussions with consultants, evaluation of patient's response to treatment, examination of patient, obtaining history from patient or surrogate, ordering and performing treatments and interventions, ordering and review of laboratory studies, ordering and review of radiographic studies, pulse oximetry and re-evaluation of patient's condition.   Length of Stay: 7  Loralie Champagne, MD  03/02/2019, 7:44 AM  Advanced Heart Failure Team Pager [phone] (M-F; 7a - 4p)  Please contact [facility] for  night-coverage after hours (4p -7a ) and weekends on [email or web address]

## 2019-03-02 NOTE — Procedures (Signed)
Electrical Cardioversion Procedure Note Judith [name] [id number] 02-24-1951  Procedure: Electrical Cardioversion Indications:  Atrial Fibrillation  Procedure Details Consent: Risks of procedure as well as the alternatives and risks of each were explained to the (patient/caregiver).  Consent for procedure obtained. Time Out: Verified patient identification, verified procedure, site/side was marked, verified correct patient position, special equipment/implants available, medications/allergies/relevent history reviewed, required imaging and test results available.  Performed  Patient placed on cardiac monitor, pulse oximetry, supplemental oxygen as necessary.  Sedation given: Versed/Fentanyl Pacer pads placed anterior and posterior chest.  Cardioverted 2 time(s).  Cardioverted at [place].  Evaluation Findings: Post procedure EKG shows: NSR Complications: None Patient did tolerate procedure well.   Loralie Champagne 03/02/2019, 3:10 PM

## 2019-03-02 NOTE — Interval H&P Note (Signed)
History and Physical Interval Note:  03/02/2019 3:04 PM  Judith [name]  has presented today for surgery, with the diagnosis of a-fib, RVR.  The various methods of treatment have been discussed with the patient and family. After consideration of risks, benefits and other options for treatment, the patient has consented to  Procedure(s): CARDIOVERSION (N/A) as a surgical intervention.  The patient's history has been reviewed, patient examined, no change in status, stable for surgery.  I have reviewed the patient's chart and labs.  Questions were answered to the patient's satisfaction.     Tou Hayner [employer]

## 2019-03-03 ENCOUNTER — Inpatient Hospital Stay (HOSPITAL_COMMUNITY): Payer: Medicare PPO

## 2019-03-03 LAB — COMPREHENSIVE METABOLIC PANEL
ALT: 140 U/L — ABNORMAL HIGH (ref 0–44)
AST: 66 U/L — ABNORMAL HIGH (ref 15–41)
Albumin: 2 g/dL — ABNORMAL LOW (ref 3.5–5.0)
Alkaline Phosphatase: 127 U/L — ABNORMAL HIGH (ref 38–126)
Anion gap: 15 (ref 5–15)
BUN: 91 mg/dL — ABNORMAL HIGH (ref 8–23)
CO2: 32 mmol/L (ref 22–32)
Calcium: 9 mg/dL (ref 8.9–10.3)
Chloride: 85 mmol/L — ABNORMAL LOW (ref 98–111)
Creatinine, Ser: 1.69 mg/dL — ABNORMAL HIGH (ref 0.44–1.00)
GFR calc Af Amer: 36 mL/min — ABNORMAL LOW (ref >60)
GFR calc non Af Amer: 31 mL/min — ABNORMAL LOW (ref >60)
Glucose, Bld: 147 mg/dL — ABNORMAL HIGH (ref 70–99)
Potassium: 4.1 mmol/L (ref 3.5–5.1)
Sodium: 132 mmol/L — ABNORMAL LOW (ref 135–145)
Total Bilirubin: 0.8 mg/dL (ref 0.3–1.2)
Total Protein: 6 g/dL — ABNORMAL LOW (ref 6.5–8.1)

## 2019-03-03 LAB — APTT
aPTT: 53 seconds — ABNORMAL HIGH (ref 24–36)
aPTT: 65 seconds — ABNORMAL HIGH (ref 24–36)

## 2019-03-03 LAB — CBC
HCT: 30.8 % — ABNORMAL LOW (ref 36.0–46.0)
Hemoglobin: 9.4 g/dL — ABNORMAL LOW (ref 12.0–15.0)
MCH: 25.5 pg — ABNORMAL LOW (ref 26.0–34.0)
MCHC: 30.5 g/dL (ref 30.0–36.0)
MCV: 83.7 fL (ref 80.0–100.0)
Platelets: 280 10*3/uL (ref 150–400)
RBC: 3.68 MIL/uL — ABNORMAL LOW (ref 3.87–5.11)
RDW: 17.1 % — ABNORMAL HIGH (ref 11.5–15.5)
WBC: 11 10*3/uL — ABNORMAL HIGH (ref 4.0–10.5)
nRBC: 0 % (ref 0.0–0.2)

## 2019-03-03 LAB — PROCALCITONIN: Procalcitonin: 0.49 ng/mL

## 2019-03-03 LAB — GLUCOSE, CAPILLARY
Glucose-Capillary: 150 mg/dL — ABNORMAL HIGH (ref 70–99)
Glucose-Capillary: 147 mg/dL — ABNORMAL HIGH (ref 70–99)
Glucose-Capillary: 167 mg/dL — ABNORMAL HIGH (ref 70–99)
Glucose-Capillary: 207 mg/dL — ABNORMAL HIGH (ref 70–99)
Glucose-Capillary: 202 mg/dL — ABNORMAL HIGH (ref 70–99)

## 2019-03-03 LAB — HEPARIN LEVEL (UNFRACTIONATED): Heparin Unfractionated: 0.91 IU/mL — ABNORMAL HIGH (ref 0.30–0.70)

## 2019-03-03 LAB — MAGNESIUM: Magnesium: 2.3 mg/dL (ref 1.7–2.4)

## 2019-03-03 LAB — COOXEMETRY PANEL
Carboxyhemoglobin: 2.2 % — ABNORMAL HIGH (ref 0.5–1.5)
Methemoglobin: 1.3 % (ref 0.0–1.5)
O2 Saturation: 79.4 %
Total hemoglobin: 10.7 g/dL — ABNORMAL LOW (ref 12.0–16.0)

## 2019-03-03 LAB — LACTIC ACID, PLASMA: Lactic Acid, Venous: 1.1 mmol/L (ref 0.5–1.9)

## 2019-03-03 LAB — PHOSPHORUS: Phosphorus: 4.1 mg/dL (ref 2.5–4.6)

## 2019-03-03 MED ORDER — RANOLAZINE ER 500 MG PO TB12
500.0000 mg | ORAL_TABLET | Freq: Two times a day (BID) | ORAL | Status: DC
  Administered 2019-03-03 (×2): 500 mg via ORAL
  Filled 2019-03-03 (×2): qty 1

## 2019-03-03 MED ORDER — AMIODARONE LOAD VIA INFUSION
150.0000 mg | Freq: Once | INTRAVENOUS | Status: AC
  Administered 2019-03-03: 14:00:00 150 mg via INTRAVENOUS
  Filled 2019-03-03: qty 83.34

## 2019-03-03 MED ORDER — MIDAZOLAM HCL 2 MG/2ML IJ SOLN
1.0000 mg | INTRAMUSCULAR | Status: DC | PRN
  Administered 2019-03-03 – 2019-03-05 (×9): 2 mg via INTRAVENOUS
  Administered 2019-03-05: 4 mg via INTRAVENOUS
  Administered 2019-03-06 – 2019-03-07 (×4): 2 mg via INTRAVENOUS
  Filled 2019-03-03 (×2): qty 2
  Filled 2019-03-03: qty 4
  Filled 2019-03-03 (×9): qty 2
  Filled 2019-03-03: qty 4
  Filled 2019-03-03 (×2): qty 2

## 2019-03-03 MED ORDER — AMIODARONE IV BOLUS ONLY 150 MG/100ML: 150.0000 mg | Freq: Once | INTRAVENOUS | Status: DC

## 2019-03-03 MED ORDER — FENTANYL CITRATE (PF) 100 MCG/2ML IJ SOLN
50.0000 ug | INTRAMUSCULAR | Status: DC | PRN
  Administered 2019-03-05: 100 ug via INTRAVENOUS
  Filled 2019-03-03: qty 2

## 2019-03-03 NOTE — Progress Notes (Signed)
[place] for Heparin  Indication: atrial fibrillation  Allergies  Allergen Reactions  . Contrast Media [Iodinated Diagnostic Agents] Hives and Other (See Comments)    Spoke to patient, Iodine allergy is really IV contrast allergy.   [name] [Insulin Aspart] Shortness Of Breath and Other (See Comments)    "breathing problems"  . Codeine Nausea And Vomiting and Other (See Comments)    HIGH DOSES-SEVERE VOMITING  . Iodine Other (See Comments)    MUST HAVE BENADRYL PRIOR TO PROCEDURE AND RIGHT BEFORE TREATMENT TO COUNTERACT REACTION-BLISTERING REACTION DERMATOLOGICAL  . Penicillins Itching, Rash and Other (See Comments)    Has patient had a PCN reaction causing immediate rash, facial/tongue/throat swelling, SOB or lightheadedness with hypotension: no Has patient had a PCN reaction causing severe rash involving mucus membranes or skin necrosis: No Has patient had a PCN reaction that required hospitalization No Has patient had a PCN reaction occurring within the last 10 years: No If all of the above answers are "NO", then may proceed with Cephalosporin use.  CHEST SIZED RASH AND ITCHING   . Ace Inhibitors Cough  . Demerol [Meperidine] Nausea And Vomiting  . Dilaudid [Hydromorphone Hcl] Other (See Comments)    HEADACHE   . Neosporin [Neomycin-Bacitracin Zn-Polymyx] Itching, Rash and Other (See Comments)    MAKES REACTIONS WORSE WHEN USING AS PROPHYLACTIC  . Percocet [Oxycodone-Acetaminophen] Rash  . Tape Itching and Rash    Patient Measurements: Height: 5\' 4"  (162.6 cm) Weight: 185 lb 6.5 oz (84.1 kg) IBW/kg (Calculated) : 54.7 Heparin Dosing Weight: 75kg  Vital Signs: Temp: 100 F (37.8 C) (10/24 0400) Temp Source: Oral (10/24 0400) BP: 122/76 (10/24 0400) Pulse Rate: 79 (10/24 0400)  Labs: Recent Labs    02/28/19 0455  03/01/19 0358  03/02/19 0421 03/02/19 0422 03/02/19 1644 03/03/19 0351  HGB 9.8*   < > 10.3*  --  9.4*  --   --   9.4*  HCT 31.6*   < > 33.0*  --  30.9*  --   --  30.8*  PLT 337  --  322  --  278  --   --  280  APTT 70*  --  54*   < > 52*  --  61* 53*  LABPROT  --   --   --   --  20.3*  --   --   --   INR  --   --   --   --  1.8*  --   --   --   HEPARINUNFRC  --   --  1.66*  --   --  1.02*  --  0.91*  CREATININE 2.32*  --  1.87*  --  1.56*  --   --   --    < > = values in this interval not displayed.    Estimated Creatinine Clearance: 37.2 mL/min (A) (by C-G formula based on SCr of 1.56 mg/dL (H)).  Assessment: 68 y.o. female with h/o Afib, Eliquis on hold, for heparin  Goal of Therapy:  Heparin level 0.3-0.7 aPTT aptt 66-90 sec seconds Monitor platelets by anticoagulation protocol: Yes   Plan:  Increase Heparin 1400 units/hr APTT in 8 hours  Phillis Knack, PharmD, BCPS

## 2019-03-03 NOTE — Progress Notes (Signed)
[place] for Heparin  Indication: atrial fibrillation  Allergies  Allergen Reactions  . Contrast Media [Iodinated Diagnostic Agents] Hives and Other (See Comments)    Spoke to patient, Iodine allergy is really IV contrast allergy.   [name] [Insulin Aspart] Shortness Of Breath and Other (See Comments)    "breathing problems"  . Codeine Nausea And Vomiting and Other (See Comments)    HIGH DOSES-SEVERE VOMITING  . Iodine Other (See Comments)    MUST HAVE BENADRYL PRIOR TO PROCEDURE AND RIGHT BEFORE TREATMENT TO COUNTERACT REACTION-BLISTERING REACTION DERMATOLOGICAL  . Penicillins Itching, Rash and Other (See Comments)    Has patient had a PCN reaction causing immediate rash, facial/tongue/throat swelling, SOB or lightheadedness with hypotension: no Has patient had a PCN reaction causing severe rash involving mucus membranes or skin necrosis: No Has patient had a PCN reaction that required hospitalization No Has patient had a PCN reaction occurring within the last 10 years: No If all of the above answers are "NO", then may proceed with Cephalosporin use.  CHEST SIZED RASH AND ITCHING   . Ace Inhibitors Cough  . Demerol [Meperidine] Nausea And Vomiting  . Dilaudid [Hydromorphone Hcl] Other (See Comments)    HEADACHE   . Neosporin [Neomycin-Bacitracin Zn-Polymyx] Itching, Rash and Other (See Comments)    MAKES REACTIONS WORSE WHEN USING AS PROPHYLACTIC  . Percocet [Oxycodone-Acetaminophen] Rash  . Tape Itching and Rash    Patient Measurements: Height: 5\' 4"  (162.6 cm) Weight: 185 lb 13.6 oz (84.3 kg) IBW/kg (Calculated) : 54.7 Heparin Dosing Weight: 75kg  Vital Signs: Temp: 99 F (37.2 C) (10/24 1200) Temp Source: Oral (10/24 1200) BP: 102/74 (10/24 1200) Pulse Rate: 95 (10/24 1200)  Labs: Recent Labs    03/01/19 0358  03/02/19 0421 03/02/19 0422 03/02/19 1644 03/03/19 0351 03/03/19 1211  HGB 10.3*  --  9.4*  --   --  9.4*  --    HCT 33.0*  --  30.9*  --   --  30.8*  --   PLT 322  --  278  --   --  280  --   APTT 54*   < > 52*  --  61* 53* 65*  LABPROT  --   --  20.3*  --   --   --   --   INR  --   --  1.8*  --   --   --   --   HEPARINUNFRC 1.66*  --   --  1.02*  --  0.91*  --   CREATININE 1.87*  --  1.56*  --   --  1.69*  --    < > = values in this interval not displayed.    Estimated Creatinine Clearance: 34.4 mL/min (A) (by C-G formula based on SCr of 1.69 mg/dL (H)).  Assessment: 68yof admitted for SOB in setting of Afib RVR was supposed to have DCCV but deferred because patient missed doses of apixaban prior. She developed hypotension, cardiogenic shock. Previously on apixaban last dose 10/17 pm.  aPTT= 65s this morning, heparin level still elevated d/t recent apixaban use, hgb stable in 9s, plt normal. No bleeding issues noted.   Failed repeat dccv 10/23, back in afib today.   Goal of Therapy:  Heparin level 0.3-0.7 aPTT aptt 66-90 sec seconds Monitor platelets by anticoagulation protocol: Yes   Plan:  -Increase heparin to 1550 units/hr -aPTT, heparin level and CBC in am  Erin Hearing PharmD., BCPS Clinical Pharmacist  03/03/2019 1:23 PM

## 2019-03-03 NOTE — Progress Notes (Signed)
Patient ID: Judith [name], female   DOB: 08-05-1950, 68 y.o.   MRN: [id number]     Advanced Heart Failure Rounding Note  PCP-Cardiologist: Judith Him, MD   Subjective:    Patient went back into atrial fibrillation with RVR 10/20, amiodarone increased to 60 mg/hr. HR 110s today.  She failed DCCV 10/22. Had repeat DC-CV on 10/23 which was successful but converted back to AF at 1030 this am during vent wean  Remains intubated/sedated. Will awaken  Dobutamine off. In AF 100-110 Remains on amio at 60.  Co-ox 79% Lactate 1.1. On lasix gtt. Weight stable. Creatinine stable at 1.6   Objective:   Weight Range: 84.3 kg Body mass index is 31.9 kg/m.   Vital Signs:   Temp:  [98.3 F (36.8 C)-100 F (37.8 C)] 99 F (37.2 C) (10/24 1200) Pulse Rate:  [69-123] 95 (10/24 1200) Resp:  [15-18] 18 (10/24 1200) BP: (95-142)/(60-91) 102/74 (10/24 1200) SpO2:  [92 %-99 %] 99 % (10/24 1200) FiO2 (%):  [40 %] 40 % (10/24 1200) Weight:  [84.3 kg] 84.3 kg (10/24 0500) Last BM Date: 02/21/19  Weight change: Filed Weights   03/01/19 0500 03/02/19 0500 03/03/19 0500  Weight: 86.6 kg 84.1 kg 84.3 kg    Intake/Output:   Intake/Output Summary (Last 24 hours) at 03/03/2019 1312 Last data filed at 03/03/2019 1200 Gross per 24 hour  Intake 2040.53 ml  Output 2205 ml  Net -164.47 ml      Physical Exam   General:  Sedated on vent But will arouse HEENT: normal + ETT Neck: supple. CVP 12 Carotids 2+ bilat; no bruits. No lymphadenopathy or thryomegaly appreciated. Cor: PMI nondisplaced. Irr tachy No rubs, gallops or murmurs. Lungs: clear Abdomen: soft, nontender, nondistended. No hepatosplenomegaly. No bruits or masses. Good bowel sounds. Extremities: no cyanosis, clubbing, rash, 1+ edema Neuro: sedated on vent but will awaken and arouse   Telemetry   Atrial fibrillation with RVR 100-110s (personally reviewed)   Labs    CBC Recent Labs    03/02/19 0421 03/03/19 0351  WBC 10.6*  11.0*  HGB 9.4* 9.4*  HCT 30.9* 30.8*  MCV 84.4 83.7  PLT 278 774   Basic Metabolic Panel Recent Labs    03/02/19 0421 03/03/19 0351  NA 130* 132*  K 3.7 4.1  CL 85* 85*  CO2 33* 32  GLUCOSE 187* 147*  BUN 86* 91*  CREATININE 1.56* 1.69*  CALCIUM 8.8* 9.0  MG 1.8 2.3  PHOS 2.3* 4.1   Liver Function Tests Recent Labs    03/02/19 0421 03/03/19 0351  AST 64* 66*  ALT 201* 140*  ALKPHOS 123 127*  BILITOT 1.1 0.8  PROT 5.7* 6.0*  ALBUMIN 2.1* 2.0*   No results for input(s): LIPASE, AMYLASE in the last 72 hours. Cardiac Enzymes No results for input(s): CKTOTAL, CKMB, CKMBINDEX, TROPONINI in the last 72 hours.  BNP: BNP (last 3 results) Recent Labs    03/06/2019 1459  BNP 1,119.0*    ProBNP (last 3 results) Recent Labs    10/13/18 1057 01/18/19 1445  PROBNP 2,908* 2,645*     D-Dimer No results for input(s): DDIMER in the last 72 hours. Hemoglobin A1C No results for input(s): HGBA1C in the last 72 hours. Fasting Lipid Panel No results for input(s): CHOL, HDL, LDLCALC, TRIG, CHOLHDL, LDLDIRECT in the last 72 hours. Thyroid Function Tests No results for input(s): TSH, T4TOTAL, T3FREE, THYROIDAB in the last 72 hours.  Invalid input(s): FREET3  Other results:  Imaging    Dg Chest Port 1 View  Result Date: 03/03/2019 CLINICAL DATA:  Respiratory failure EXAM: PORTABLE CHEST 1 VIEW COMPARISON:  Chest radiograph from one day prior. FINDINGS: Endotracheal tube tip is 4.7 cm above the carina. Enteric tube enters stomach with the tip not seen on this image. Right internal jugular central venous catheter terminates over the right atrium. Stable cardiomediastinal silhouette with mild cardiomegaly. No pneumothorax. Probable small bilateral pleural effusions, stable. Mild pulmonary edema, similar. Bibasilar atelectasis, increased on the right. IMPRESSION: 1. Well-positioned support structures. No pneumothorax. 2. Stable mild congestive heart failure. 3. Bibasilar  atelectasis, increased on the right. 4. Stable small bilateral pleural effusions. Electronically Signed   By: Ilona Sorrel M.D.   On: 03/03/2019 12:51     Medications:     Scheduled Medications:  atorvastatin  20 mg Per Tube Daily   budesonide (PULMICORT) nebulizer solution  0.25 mg Nebulization BID   buPROPion  75 mg Per Tube BID   chlorhexidine gluconate (MEDLINE KIT)  15 mL Mouth Rinse BID   Chlorhexidine Gluconate Cloth  6 each Topical Daily   famotidine  20 mg Per Tube Daily   ferrous sulfate  300 mg Per Tube Daily   FLUoxetine  20 mg Per Tube QHS   insulin aspart  1 Units Subcutaneous Q4H   insulin aspart  2-6 Units Subcutaneous Q4H   insulin detemir  15 Units Subcutaneous BID   loratadine  10 mg Per Tube QHS   magnesium oxide  400 mg Per Tube QHS   mouth rinse  15 mL Mouth Rinse 10 times per day   methimazole  10 mg Per Tube BID   sodium chloride flush  10-40 mL Intracatheter Q12H    Infusions:  sodium chloride 250 mL (03/01/19 1138)   amiodarone 60 mg/hr (03/03/19 1200)   feeding supplement (VITAL HIGH PROTEIN) 1,000 mL (03/03/19 0856)   furosemide (LASIX) infusion 12 mg/hr (03/03/19 1200)   heparin 1,400 Units/hr (03/03/19 1200)   insulin Stopped (03/01/19 1511)   norepinephrine (LEVOPHED) Adult infusion Stopped (02/27/19 1321)    PRN Medications: sodium chloride, albuterol, bisacodyl, bisacodyl, docusate, docusate, fentaNYL (SUBLIMAZE) injection, Melatonin, midazolam, nitroGLYCERIN, ondansetron (ZOFRAN) IV, polyethylene glycol, sodium chloride flush, triamcinolone   Assessment/Plan   1. Acute on chronic systolic CHF/cardiogenic shock: Echo this admission with new fall in EF, 30-35% with diffuse hypokinesis worse in the inferoseptal wall, severe LAE. Possible tachy-mediated CMP as she was in rapid atypical atrial flutter rate 150s at admission.  She converted to NSR but converted back in atrial fibrillation on 10/22. Repeat DC-CV on 10/23  converted to NSR but flipped back to AF this am. - Remains in AF 100-110 - Off dobutamine. Co-ox 79%  Lactate 1.1 - On Lasix gtt at 12 mg/hr. Weight unchanged. CVP 12 Creatinine stable ~ 1.6. Continue lasix gtt.  - Would ideally have workup for coronary disease as cause of cardiomyopathy (has known prior CAD), but would hold off until creatinine comes down and stabilizes.  2. Atypical atrial flutter/atrial fibrillation: Patient has had both atrial fibrillation and atypical flutter.  Admitted with atypical flutter/RVR in 150s.  She had emergent DCCV initially and remained in NSR for several days but back in persistent afib 10/20.  She has severe LAE and moderate RAE. She has been seen by Dr. Rayann Heman.  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 She is not a good  ablation candidate given the size of the atria though could consider if she holds NSR for a month or two.  Rate is still not controlled. Failed DCCV yesterday but was still on dobutamine. Repeat DC-CV on 10/23 converted to NSR but flipped back to AF this am. - Continue amiodarone gtt at 60 mg/hr. Start Ranexa. Stop TFs at MN. Plan DC-CV in am> Bolus amio 150 now.  - Eventual option will be AV nodal ablation/BiV pacing if we cannot cardiovert her and she stabilizes/extubates.   - Continue heparin gtt (stopped apixaban).  No bleeding. Discussed dosing with PharmD personally. 3. Liver failure: Markedly elevated transaminases.  Suspect shock liver from hypotension/shock.  LFTs now trending down.  - Maintain MAP and CO, should improve over time.  - Stopped statin for now.  4. CAD: Known CTO RCA, prior DES to LCx/OM in 2016.  Peak hs-TnI 548.  Most likely demand ischemia from shock.  - Holding statin with liver failure, restart as LFTs trend down.  - As above, would consider coronary angiography once creatinine comes down.  5.  Hyperthyroidism: Thyrotoxicosis likely helps drive the atrial fibrillation/flutter.  This may have been triggered by chronic amiodarone use.  Unfortunately, she does not have a lot of options right now and will need to continue amiodarone.  - She is now off steroids.   - Continue methimazole.  6. AKI: On CKD stage 3.  Suspect due to shock. Creatinine stable at 1.6 today. BUN climbing now 91 - Watch closely with diuresis  7. Aortic stenosis: Probably moderate.  8. Acute hypoxemic respiratory failure: Suspected pulmonary edema, but based on behavior on vent, ?underlying COPD.  She is a prior smoker. CXR today with infiltrates, no fever and WBCs mildly elevated.  Self-extubated today and re-intubated. Afebrile, PCT 0.45=>0.49.   - Per CCM.  - Trach aspirate culture results pending.    CRITICAL CARE Performed by: Glori Bickers  Total critical care time: 35 minutes  Critical care time was exclusive of separately billable procedures and treating other patients.  Critical care was necessary to treat or prevent imminent or life-threatening deterioration.  Critical care was time spent personally by me on the following activities: development of treatment plan with patient and/or surrogate as well as nursing, discussions with consultants, evaluation of patient's response to treatment, examination of patient, obtaining history from patient or surrogate, ordering and performing treatments and interventions, ordering and review of laboratory studies, ordering and review of radiographic studies, pulse oximetry and re-evaluation of patient's condition.   Length of Stay: [place], MD  03/03/2019, 1:12 PM  Advanced Heart Failure Team Pager [phone] (M-F; [place])  Please contact [facility] for night-coverage after hours (4p -7a ) and weekends on [email or web address]

## 2019-03-03 NOTE — Plan of Care (Signed)
  Problem: Clinical Measurements: Goal: Ability to maintain clinical measurements within normal limits will improve Outcome: Progressing Goal: Will remain free from infection Outcome: Progressing Goal: Diagnostic test results will improve Outcome: Progressing Goal: Respiratory complications will improve Outcome: Progressing Goal: Cardiovascular complication will be avoided Outcome: Progressing   Problem: Activity: Goal: Risk for activity intolerance will decrease Outcome: Progressing   Problem: Coping: Goal: Level of anxiety will decrease Outcome: Progressing   Problem: Elimination: Goal: Will not experience complications related to bowel motility Outcome: Progressing   Problem: Pain Managment: Goal: General experience of comfort will improve Outcome: Progressing   Problem: Safety: Goal: Ability to remain free from injury will improve Outcome: Progressing   Problem: Skin Integrity: Goal: Risk for impaired skin integrity will decrease Outcome: Progressing   

## 2019-03-03 NOTE — Progress Notes (Signed)
NAME:  Judith [name], MRN:  [id number], DOB:  1950-06-29, LOS: 54 ADMISSION DATE:  02/11/2019, CONSULTATION DATE:  03/06/2019 REFERRING MD:  Dr. Rayann Heman, CHIEF COMPLAINT:  Resp. Fail.   Brief History   68 y/o with multiple comorbidities, including paroxysmal afib. Admitted in afib w/ RVR with what appears to be developing CHF. Admitted 10/16, became more SOB 10/17, still in AFib despite amiodarone and dilt. Taken for cardioversion but became more hypotensive,  requiring pressor support, intubated. Called for vent management; assistance with pressors   Past Medical History  1) paroxysmal afib 2) CHF 3) COPD(?) 4) Hyperthyroidism 5) IDDM 6) CKD stage III Significant Hospital Events   10/17 cardioversion 10/17 intubation       10/19 Severe vent asynchrony overnight,  Vecuronium x1 on 2 consecutive nights, changed to pressure control 10/20 titrating pressors and inotrope support.  Had been on bicarb gtt this was stopped.   10/21 Hyperglycemic overnight; insulin gtt started. Off norepinephrine, Co-ox 73% on dobutamine. [place] a little better. Back in AF. Decreasing dobutamine to 3. Lasix 80mg   f/b gtt started. Failed SBT attempt.   10/23 -  Re-tubed 10/22 after self extubation CXT 10/22>> Bilateral parenchymal opacities slightly improved , mild edema PCT 0.49 Lactate 1.3 Co Ox 81%, CVP 11-12 Dobutamine at 1 mcg/kg/min>> now off Amio at 60 Lasix gtt at 12 with good diuresis( net negative 395) Improved UO>> creatinine 1.56, BUN 86 Elevated LFT's Remains in a fib, tachycardic, on amiodarone at 60 DCCV planned for 10/23  Consults:  Cardiology PCCM  Procedures:  10/17--aline 10/17 ETT >> 10/22 ( self extubation) 10/17 RIJ central line >> 10/17 cardioversion 10/22 ETT>>>  Significant Diagnostic Tests:  10/18: ULTRASOUND ABDOMEN LIMITED RIGHT UPPER QUADRANT 1. Mild gallbladder wall thickening up to 7 mm, which could be related to incomplete distension. No other sonographic features for  acute cholecystitis. 2. Previously seen cholelithiasis not visualized on today's exam. 3. No biliary dilatation or other acute finding.  10/17 TTE: EF  30 to 35%.  global LV hypokinesis with severe inferosptal hypokinesis.  mod AS, mod MR  Micro Data:  COVID neg HIV neg MRSA neg Tracheal Aspirate> GS>> FEW GRAM POSITIVE RODS  RARE GRAM POSITIVE COCCI IN PAIRS  Antimicrobials:  none   Interim history/subjective:    10/23 -  On vent. Doing PSV without sedation gtt but RN reports patient is exhausted. Still drowsy.  CXR with LLL atelectasis. Negative cultures so far. On TF. On heparin gtt, lasix gtt, amio gtt. Not on dobutamine. LFT better. Creat up at 1.69 after being in 1.5s/ SP DCCV yeserday with return to NSR    Objective   Blood pressure (!) 142/91, pulse 86, temperature 98.3 F (36.8 C), temperature source Oral, resp. rate 18, height 5\' 4"  (1.626 m), weight 84.3 kg, SpO2 92 %.    Vent Mode: CPAP;PSV FiO2 (%):  [40 %] 40 % Set Rate:  [18 bmp] 18 bmp PEEP:  [5 cmH20] 5 cmH20 Pressure Support:  [10 cmH20] 10 cmH20 Plateau Pressure:  [19 cmH20-20 cmH20] 20 cmH20   Intake/Output Summary (Last 24 hours) at 03/03/2019 1014 Last data filed at 03/03/2019 1000 Gross per 24 hour  Intake 2248.09 ml  Output 2130 ml  Net 118.09 ml   Filed Weights   03/01/19 0500 03/02/19 0500 03/03/19 0500  Weight: 86.6 kg 84.1 kg 84.3 kg     General Appearance:  Looks criticall ill OBESE - +. Looks exhausted Head:  Normocephalic, without obvious abnormality, atraumatic Eyes:  PERRL - yes, conjunctiva/corneas - muddy     Ears:  Normal external ear canals, both ears Nose:  G tube - no Throat:  ETT TUBE - yes , OG tube - yes with TF Neck:  Supple,  No enlargement/tenderness/nodules Lungs: Clear to auscultation bilaterally, Ventilator   Synchrony - yes on PSV at 40% Heart:  S1 and S2 normal, no murmur, CVP - no.  Pressors - per [id number] Abdomen:  Soft, no masses, no organomegaly Genitalia /  Rectal:  Not done Extremities:  Extremities- r Side BKA Skin:  ntact in exposed areas . Sacral area - not examined Neurologic:  Sedation - none currently -> RASS - -4 . Moves all 4s - yes. CAM-ICU - cannot assess . Orientation - cannot assess        Resolved Hospital Problem list   Cardiogenic shock Metabolic acidosis Assessment & Plan:  Multiple comorbidities including Afib, cardioverted with subsequent cardiogenic shock and acute respiratory failure   Acute respiratory failure in setting of pulmonary edema and cardiogenic shock - Severe vent asynchrony requiring paralytic 10/19 & 10/20, Reintubation 10/22 after self extubation Former smoker ? Underlying COPD   10/24 - doing SBT for acute resp failure but does NOT meet extubation criteira due to mental status and physical exhaustion  Plan PRVC VAP Bundle   Acute systolic heart failure Cardiogenic shock  03/03/2019 - dobutamine gtt off, x 48h. Lasix ongoing  Plan Per CHF team   Atrial fibrillation with RVR-status post cardioversion 10/23 and return to sinus  03/03/2019  - in NSR. 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 Plan  Methimazole was resumed 10/19  Anemia of critical illnes   - PRBC for hgb </= 6.9gm%    - exceptions are   -  if ACS susepcted/confirmed then transfuse for hgb </=  8.0gm%,  or    -  active bleeding with hemodynamic instability, then transfuse regardless of hemoglobin value   At at all times try to transfuse 1 unit prbc as possible with exception of active hemorrhage    Best practice:  Diet: NPO Pain/Anxiety/Delirium protocol (if indicated): Fentanyl drip, intermittent Versed VAP protocol (if indicated): in place DVT prophylaxis: heparin, SCDs GI prophylaxis: per protocol Glucose control: SSI Mobility: bed Code Status: full Family Communication: No family at bedside. Sister Rulon Eisenmenger - updated over phone Disposition: ICU   ATTESTATION & SIGNATURE   The patient Judith [name] is critically ill with multiple organ systems failure and requires high complexity decision making for assessment and support, frequent evaluation and titration of therapies, application of advanced monitoring technologies and extensive interpretation of multiple databases.   Critical Care Time devoted to patient care services described in this note is  30  Minutes. This time reflects time of care of this signee Dr Brand Males. This critical care time does not reflect procedure time, or teaching time or supervisory time of PA/NP/Med student/Med  Resident etc but could involve care discussion time     Dr. Brand Males, M.D., [facility].C.P Pulmonary and Critical Care Medicine Staff Physician [facility] Pager: [phone], If no answer or between  15:00h - 7:00h: call [phone]  03/03/2019 10:32 AM     LABS    PULMONARY Recent Labs  Lab 03/06/2019 1931  03/09/2019 2228  02/27/19 0322  02/27/19 0501  02/28/19 1247 03/01/19 0405 03/02/19 0414 03/02/19 1504 03/03/19 0418  PHART 7.365  --  7.429  --  7.322*  --  7.395  --  7.412  --   --   --   --   PCO2ART 44.6  --  40.1  --  55.4*  --  44.8  --  49.9*  --   --   --   --   PO2ART 112.0*  --  116.0*  --  116.0*  --  115.0*  --  65.0*  --   --   --   --   HCO3  25.6  --  26.6  --  28.8*  --  27.5  --  31.8*  --   --   --   --   TCO2 27  --  28  --  30  --  29  --  33*  --   --   --   --   O2SAT 98.0   < > 99.0   < > 98.0   < > 98.0   < > 92.0 64.5 81.2 63.2 79.4   < > = values in this interval not displayed.    CBC Recent Labs  Lab 03/01/19 0358 03/02/19 0421 03/03/19 0351  HGB 10.3* 9.4* 9.4*  HCT 33.0* 30.9* 30.8*  WBC 13.0* 10.6* 11.0*  PLT 322 278 280    COAGULATION Recent Labs  Lab 02/12/2019 1712 02/25/19 0250 03/02/19 0421  INR 6.5* 5.4* 1.8*    CARDIAC  No results for input(s): TROPONINI in the last 168 hours. No results for input(s): PROBNP in the last 168 hours.   CHEMISTRY Recent Labs  Lab 02/13/2019 1712  02/27/19 0353  02/28/19 0455 02/28/19 1247 03/01/19 0358 03/02/19 0421 03/03/19 0351  NA 129*   < > 130*   < > 130* 130* 132* 130* 132*  K 5.1   < > 4.0   < > 3.8 3.7 3.4* 3.7 4.1  CL 92*   < > 89*  --  90*  --  87* 85* 85*  CO2 17*   < > 27  --  27  --  32 33* 32  GLUCOSE 87   < > 264*  --  241*  --  261* 187* 147*  BUN 39*   < > 80*  --  96*  --  92* 86* 91*  CREATININE 2.29*   < > 2.65*  --  2.32*  --  1.87* 1.56* 1.69*  CALCIUM 8.6*   < > 8.5*  --  8.5*  --  9.1 8.8* 9.0  MG 2.0  --  2.3  --   --   --   --  1.8 2.3  PHOS 5.7*  --   --   --   --   --   --  2.3* 4.1   < > = values in this interval not displayed.   Estimated Creatinine Clearance: 34.4 mL/min (A) (by C-G formula based on SCr of 1.69 mg/dL (H)).  LIVER Recent Labs  Lab 03/08/2019 1712 02/25/19 0250  02/27/19 0353 02/28/19 0455 03/01/19 0358 03/02/19 0421 03/03/19 0351  AST  --  3,843*   < > 675* 220* 105* 64* 66*  ALT  --  1,306*   < > 832* 556* 355* 201* 140*  ALKPHOS  --  151*   < > 130* 131* 138* 123 127*  BILITOT  --  1.7*   < > 1.5* 0.9 1.1 1.1 0.8  PROT  --  6.1*   < > 5.7* 5.6* 6.2* 5.7* 6.0*  ALBUMIN  --  2.9*   < > 2.6* 2.4* 2.5* 2.1* 2.0*  INR 6.5* 5.4*  --   --   --   --  1.8*  --    < > = values in this interval  not displayed.     INFECTIOUS Recent Labs  Lab 02/26/19 0902 03/01/19 0943 03/02/19 0421 03/03/19 0351 03/03/19 0400  LATICACIDVEN 1.2  --  1.3  --  1.1  PROCALCITON  --  0.45 0.49 0.49  --      ENDOCRINE CBG (last 3)  Recent Labs    03/02/19 2357 03/03/19 0434 03/03/19 0809  GLUCAP 142* 150* 147*         IMAGING x48h  - image(s) personally visualized  -   highlighted in bold Dg Chest Port 1 View  Result Date: 03/02/2019 CLINICAL DATA:  Atrial fibrillation. EXAM: PORTABLE CHEST 1 VIEW COMPARISON:  03/01/2019 FINDINGS: The endotracheal tube is 3.8 cm above the carina. The NG tube is coursing down the esophagus and into the stomach. Right IJ central venous catheter tip is in the right atrium, unchanged. New external pacer paddles are noted overlying the chest. Continued improve lung aeration, likely resolving pulmonary edema. No definite pleural effusions. IMPRESSION: 1. Support apparatus in good position without complicating features. 2. New external pacer paddles. 3. Improved lung aeration, likely resolving pulmonary edema. Electronically Signed   By: Marijo Sanes M.D.   On: 03/02/2019 11:31

## 2019-03-04 ENCOUNTER — Inpatient Hospital Stay (HOSPITAL_COMMUNITY): Payer: Medicare PPO

## 2019-03-04 DIAGNOSIS — J9601 Acute respiratory failure with hypoxia: Secondary | ICD-10-CM | POA: Diagnosis not present

## 2019-03-04 LAB — URINALYSIS, ROUTINE W REFLEX MICROSCOPIC
Bilirubin Urine: NEGATIVE
Glucose, UA: NEGATIVE mg/dL
Ketones, ur: NEGATIVE mg/dL
Nitrite: NEGATIVE
Protein, ur: NEGATIVE mg/dL
Specific Gravity, Urine: 1.013 (ref 1.005–1.030)
WBC, UA: >50 WBC/hpf — ABNORMAL HIGH (ref 0–5)
pH: 5 (ref 5.0–8.0)

## 2019-03-04 LAB — CULTURE, RESPIRATORY W GRAM STAIN: Gram Stain: NONE SEEN

## 2019-03-04 LAB — GLUCOSE, CAPILLARY
Glucose-Capillary: 116 mg/dL — ABNORMAL HIGH (ref 70–99)
Glucose-Capillary: 129 mg/dL — ABNORMAL HIGH (ref 70–99)
Glucose-Capillary: 135 mg/dL — ABNORMAL HIGH (ref 70–99)
Glucose-Capillary: 143 mg/dL — ABNORMAL HIGH (ref 70–99)
Glucose-Capillary: 153 mg/dL — ABNORMAL HIGH (ref 70–99)
Glucose-Capillary: 159 mg/dL — ABNORMAL HIGH (ref 70–99)
Glucose-Capillary: 164 mg/dL — ABNORMAL HIGH (ref 70–99)
Glucose-Capillary: 165 mg/dL — ABNORMAL HIGH (ref 70–99)
Glucose-Capillary: 175 mg/dL — ABNORMAL HIGH (ref 70–99)
Glucose-Capillary: 177 mg/dL — ABNORMAL HIGH (ref 70–99)
Glucose-Capillary: 183 mg/dL — ABNORMAL HIGH (ref 70–99)
Glucose-Capillary: 243 mg/dL — ABNORMAL HIGH (ref 70–99)
Glucose-Capillary: 260 mg/dL — ABNORMAL HIGH (ref 70–99)
Glucose-Capillary: 274 mg/dL — ABNORMAL HIGH (ref 70–99)

## 2019-03-04 LAB — HEPATIC FUNCTION PANEL
ALT: 111 U/L — ABNORMAL HIGH (ref 0–44)
AST: 69 U/L — ABNORMAL HIGH (ref 15–41)
Albumin: 1.8 g/dL — ABNORMAL LOW (ref 3.5–5.0)
Alkaline Phosphatase: 152 U/L — ABNORMAL HIGH (ref 38–126)
Bilirubin, Direct: 0.3 mg/dL — ABNORMAL HIGH (ref 0.0–0.2)
Indirect Bilirubin: 0.4 mg/dL (ref 0.3–0.9)
Total Bilirubin: 0.7 mg/dL (ref 0.3–1.2)
Total Protein: 5.8 g/dL — ABNORMAL LOW (ref 6.5–8.1)

## 2019-03-04 LAB — CBC
HCT: 31.7 % — ABNORMAL LOW (ref 36.0–46.0)
Hemoglobin: 9.6 g/dL — ABNORMAL LOW (ref 12.0–15.0)
MCH: 25.1 pg — ABNORMAL LOW (ref 26.0–34.0)
MCHC: 30.3 g/dL (ref 30.0–36.0)
MCV: 83 fL (ref 80.0–100.0)
Platelets: 274 10*3/uL (ref 150–400)
RBC: 3.82 MIL/uL — ABNORMAL LOW (ref 3.87–5.11)
RDW: 17.2 % — ABNORMAL HIGH (ref 11.5–15.5)
WBC: 15.2 10*3/uL — ABNORMAL HIGH (ref 4.0–10.5)
nRBC: 0 % (ref 0.0–0.2)

## 2019-03-04 LAB — BASIC METABOLIC PANEL
Anion gap: 13 (ref 5–15)
BUN: 95 mg/dL — ABNORMAL HIGH (ref 8–23)
CO2: 33 mmol/L — ABNORMAL HIGH (ref 22–32)
Calcium: 8.7 mg/dL — ABNORMAL LOW (ref 8.9–10.3)
Chloride: 86 mmol/L — ABNORMAL LOW (ref 98–111)
Creatinine, Ser: 1.57 mg/dL — ABNORMAL HIGH (ref 0.44–1.00)
GFR calc Af Amer: 39 mL/min — ABNORMAL LOW (ref >60)
GFR calc non Af Amer: 34 mL/min — ABNORMAL LOW (ref >60)
Glucose, Bld: 279 mg/dL — ABNORMAL HIGH (ref 70–99)
Potassium: 4.2 mmol/L (ref 3.5–5.1)
Sodium: 132 mmol/L — ABNORMAL LOW (ref 135–145)

## 2019-03-04 LAB — PHOSPHORUS: Phosphorus: 3.1 mg/dL (ref 2.5–4.6)

## 2019-03-04 LAB — PROCALCITONIN: Procalcitonin: 0.21 ng/mL

## 2019-03-04 LAB — MAGNESIUM: Magnesium: 1.9 mg/dL (ref 1.7–2.4)

## 2019-03-04 LAB — HEPARIN LEVEL (UNFRACTIONATED): Heparin Unfractionated: 0.8 IU/mL — ABNORMAL HIGH (ref 0.30–0.70)

## 2019-03-04 LAB — STREP PNEUMONIAE URINARY ANTIGEN: Strep Pneumo Urinary Antigen: NEGATIVE

## 2019-03-04 LAB — COOXEMETRY PANEL
Carboxyhemoglobin: 1.9 % — ABNORMAL HIGH (ref 0.5–1.5)
Methemoglobin: 1.2 % (ref 0.0–1.5)
O2 Saturation: 64 %
Total hemoglobin: 13.6 g/dL (ref 12.0–16.0)

## 2019-03-04 LAB — APTT: aPTT: 86 seconds — ABNORMAL HIGH (ref 24–36)

## 2019-03-04 MED ORDER — VANCOMYCIN HCL 10 G IV SOLR
2000.0000 mg | Freq: Once | INTRAVENOUS | Status: AC
  Administered 2019-03-04: 2000 mg via INTRAVENOUS
  Filled 2019-03-04 (×2): qty 2000

## 2019-03-04 MED ORDER — INSULIN REGULAR(HUMAN) IN NACL 100-0.9 UT/100ML-% IV SOLN
INTRAVENOUS | Status: DC
  Administered 2019-03-04: 0.6 [IU]/h via INTRAVENOUS
  Filled 2019-03-04: qty 100

## 2019-03-04 MED ORDER — VANCOMYCIN HCL 10 G IV SOLR
2000.0000 mg | Freq: Once | INTRAVENOUS | Status: DC
  Filled 2019-03-04: qty 2000

## 2019-03-04 MED ORDER — MAGNESIUM SULFATE 2 GM/50ML IV SOLN
2.0000 g | Freq: Once | INTRAVENOUS | Status: AC
  Administered 2019-03-04: 2 g via INTRAVENOUS
  Filled 2019-03-04: qty 50

## 2019-03-04 MED ORDER — VANCOMYCIN HCL 10 G IV SOLR
1250.0000 mg | INTRAVENOUS | Status: DC
  Administered 2019-03-06: 1250 mg via INTRAVENOUS
  Filled 2019-03-04: qty 1250

## 2019-03-04 MED ORDER — INSULIN ASPART 100 UNIT/ML ~~LOC~~ SOLN: 5.0000 [IU] | SUBCUTANEOUS | Status: DC

## 2019-03-04 MED ORDER — INSULIN ASPART 100 UNIT/ML ~~LOC~~ SOLN
0.0000 [IU] | SUBCUTANEOUS | Status: DC
  Administered 2019-03-04: 8 [IU] via SUBCUTANEOUS
  Administered 2019-03-04: 5 [IU] via SUBCUTANEOUS

## 2019-03-04 MED ORDER — SODIUM CHLORIDE 0.9 % IV SOLN
2.0000 g | Freq: Two times a day (BID) | INTRAVENOUS | Status: DC
  Administered 2019-03-04 – 2019-03-06 (×5): 2 g via INTRAVENOUS
  Filled 2019-03-04 (×6): qty 2

## 2019-03-04 MED ORDER — SODIUM CHLORIDE 0.9 % IV SOLN
2.0000 g | Freq: Two times a day (BID) | INTRAVENOUS | Status: DC
  Filled 2019-03-04 (×2): qty 2

## 2019-03-04 NOTE — Progress Notes (Signed)
250 mL Fentanyl wasted in stericycle with Audry Riles RN.

## 2019-03-04 NOTE — CV Procedure (Signed)
    DIRECT CURRENT CARDIOVERSION  NAME:  Judith [name]   MRN: [id number] DOB:  05-01-1951   ADMIT DATE: 02/28/2019   INDICATIONS: Atrial fibrillation    PROCEDURE:   Informed consent was obtained prior to the procedure from patient's family. The risks, benefits and alternatives for the procedure were discussed and the patient comprehended these risks. Once an appropriate time out was taken, the patient had the defibrillator pads placed in the anterior and posterior position. The patient was already on the ventilator and was sedated further. Once an appropriate level of sedation was achieved, the patient received a biphasic synchronized 200J shock. She converted to NSR for 1-2 beats and then went back to AF. I then repositioned the pads and delivered a second 200J shock while holding manual pressure on the anterior pad and she converted promptly to NSR. Stayed in NSR for about 15 beats then reverted to AF,  Suspect she will need AVN ablation and CRT.   Glori Bickers, MD  2:00 PM

## 2019-03-04 NOTE — Progress Notes (Addendum)
Patient ID: Judith [name], female   DOB: [birth year], 68 y.o.   MRN: [id number]     Advanced Heart Failure Rounding Note  PCP-Cardiologist: Fransico Him, MD   Subjective:    Patient went back into atrial fibrillation with RVR 10/20, amiodarone increased to 60 mg/hr. HR 110s today.  She failed DCCV 10/22. Had repeat DC-CV on 10/23 which was successful but converted back to AF on 102/4   Remains in AF 110-120. Awake on vent. Off intotropes. Co-ox 64%  On lasix gtt. Weight up 3 pounds. Creatinine stable at 1.6 K 3.1  WBC climbing to 15.2. UA +. Sputum cx + for MRSA and citerobacter. On vanc/cefepime   Objective:   Weight Range: 85.3 kg Body mass index is 32.28 kg/m.   Vital Signs:   Temp:  [98.9 F (37.2 C)-100.1 F (37.8 C)] 99.5 F (37.5 C) (10/25 1157) Pulse Rate:  [96-114] 114 (10/25 0700) Resp:  [15-27] 18 (10/25 0700) BP: (95-125)/(67-83) 124/79 (10/25 0700) SpO2:  [93 %-99 %] 97 % (10/25 0700) FiO2 (%):  [40 %] 40 % (10/25 1035) Weight:  [85.3 kg] 85.3 kg (10/25 0500) Last BM Date: 02/21/19  Weight change: Filed Weights   03/02/19 0500 03/03/19 0500 03/04/19 0500  Weight: 84.1 kg 84.3 kg 85.3 kg    Intake/Output:   Intake/Output Summary (Last 24 hours) at 03/04/2019 1224 Last data filed at 03/04/2019 0700 Gross per 24 hour  Intake 1645.42 ml  Output 2215 ml  Net -569.58 ml      Physical Exam   General:  Awake on vent. Not following commands HEENT:+ETT Neck: supple. JVP to jaw Carotids 2+ bilat; no bruits. No lymphadenopathy or thryomegaly appreciated. Cor: PMI nondisplaced. Irr tachy Lungs: clear Abdomen: soft, nontender, nondistended. No hepatosplenomegaly. No bruits or masses. Good bowel sounds. Extremities: no cyanosis, clubbing, rash, edema R BKA Neuro: awake on vent. Not following commands   Telemetry   Atrial fibrillation with RVR 110-120s (personally reviewed)   Labs    CBC Recent Labs    03/03/19 0351 03/04/19 0308  WBC 11.0*  15.2*  HGB 9.4* 9.6*  HCT 30.8* 31.7*  MCV 83.7 83.0  PLT 280 833   Basic Metabolic Panel Recent Labs    03/03/19 0351 03/04/19 0038 03/04/19 0308  NA 132* 132*  --   K 4.1 4.2  --   CL 85* 86*  --   CO2 32 33*  --   GLUCOSE 147* 279*  --   BUN 91* 95*  --   CREATININE 1.69* 1.57*  --   CALCIUM 9.0 8.7*  --   MG 2.3  --  1.9  PHOS 4.1  --  3.1   Liver Function Tests Recent Labs    03/03/19 0351 03/04/19 0308  AST 66* 69*  ALT 140* 111*  ALKPHOS 127* 152*  BILITOT 0.8 0.7  PROT 6.0* 5.8*  ALBUMIN 2.0* 1.8*   No results for input(s): LIPASE, AMYLASE in the last 72 hours. Cardiac Enzymes No results for input(s): CKTOTAL, CKMB, CKMBINDEX, TROPONINI in the last 72 hours.  BNP: BNP (last 3 results) Recent Labs    03/06/2019 1459  BNP 1,119.0*    ProBNP (last 3 results) Recent Labs    10/13/18 1057 01/18/19 1445  PROBNP 2,908* 2,645*     D-Dimer No results for input(s): DDIMER in the last 72 hours. Hemoglobin A1C No results for input(s): HGBA1C in the last 72 hours. Fasting Lipid Panel No results for input(s): CHOL, HDL, LDLCALC, TRIG,  CHOLHDL, LDLDIRECT in the last 72 hours. Thyroid Function Tests No results for input(s): TSH, T4TOTAL, T3FREE, THYROIDAB in the last 72 hours.  Invalid input(s): FREET3  Other results:   Imaging    No results found.   Medications:     Scheduled Medications: . atorvastatin  20 mg Per Tube Daily  . budesonide (PULMICORT) nebulizer solution  0.25 mg Nebulization BID  . buPROPion  75 mg Per Tube BID  . chlorhexidine gluconate (MEDLINE KIT)  15 mL Mouth Rinse BID  . Chlorhexidine Gluconate Cloth  6 each Topical Daily  . famotidine  20 mg Per Tube Daily  . ferrous sulfate  300 mg Per Tube Daily  . FLUoxetine  20 mg Per Tube QHS  . insulin detemir  15 Units Subcutaneous BID  . loratadine  10 mg Per Tube QHS  . magnesium oxide  400 mg Per Tube QHS  . mouth rinse  15 mL Mouth Rinse 10 times per day  .  methimazole  10 mg Per Tube BID  . sodium chloride flush  10-40 mL Intracatheter Q12H    Infusions: . sodium chloride 250 mL (03/01/19 1138)  . amiodarone 60 mg/hr (03/04/19 1156)  . ceFEPime (MAXIPIME) IV    . feeding supplement (VITAL HIGH PROTEIN) 1,000 mL (03/03/19 0856)  . furosemide (LASIX) infusion 12 mg/hr (03/04/19 0700)  . heparin 1,550 Units/hr (03/04/19 0700)  . insulin    . norepinephrine (LEVOPHED) Adult infusion Stopped (02/27/19 1321)  . [START ON 03/06/2019] vancomycin    . vancomycin      PRN Medications: sodium chloride, albuterol, bisacodyl, docusate, fentaNYL (SUBLIMAZE) injection, Melatonin, midazolam, nitroGLYCERIN, ondansetron (ZOFRAN) IV, polyethylene glycol, sodium chloride flush, triamcinolone   Assessment/Plan   1. Acute on chronic systolic CHF/cardiogenic shock: Echo this admission with new fall in EF, 30-35% with diffuse hypokinesis worse in the inferoseptal wall, severe LAE. Possible tachy-mediated CMP as she was in rapid atypical atrial flutter rate 150s at admission.  She converted to NSR but converted back in atrial fibrillation on 10/22. Repeat DC-CV on 10/23 converted to NSR but flipped back to AF this am. - Remains in AF 110-120 - Off dobutamine. Co-ox 64%  - On Lasix gtt at 12 mg/hr. Weight up. CVP 12. Creatinine stable ~ 1.6. But BUN climbing. Hold lasix gtt. May be as low as we can get CVP for now.  - Would ideally have workup for coronary disease as cause of cardiomyopathy (has known prior CAD), but would hold off until creatinine comes down and stabilizes.  2. Atypical atrial flutter/atrial fibrillation: Patient has had both atrial fibrillation and atypical flutter.  Admitted with atypical flutter/RVR in 150s.  She had emergent DCCV initially and remained in NSR for several days but back in persistent afib 10/20.  She has severe LAE and moderate RAE. She has been seen by Dr. Rayann Heman.  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 She is not a good ablation candidate given the size of the atria though could consider if she holds NSR for a month or two.  Rate is still not controlled. Failed DCCV on 10/23 but was still on dobutamine. Repeat DC-CV on 10/23 converted to NSR but flipped back to AF on 10/24. Remains in AF with RVR 110-120. Ranexa started yesterday for synergy but unable to give because can't crush down tube.  - Continue amiodarone gtt at 60 mg/hr. 2 boluses of  amio given. Will repeat DC-CV today - Eventual option will be AV nodal ablation/BiV pacing if we cannot cardiovert her and she stabilizes/extubates.   - Continue heparin gtt (stopped apixaban).  No bleeding. Discussed dosing with PharmD personally. 3. Liver failure: Markedly elevated transaminases.  Suspect shock liver from hypotension/shock.  LFTs now trending down.  - Maintain MAP and CO, should improve over time.  - Stopped statin for now.  4. CAD: Known CTO RCA, prior DES to LCx/OM in 2016.  Peak hs-TnI 548.  Most likely demand ischemia from shock.  - Back on statin - As above, would consider coronary angiography once creatinine comes down.  5. Hyperthyroidism: Thyrotoxicosis likely helps drive the atrial fibrillation/flutter.  This may have been triggered by chronic amiodarone use.  Unfortunately, she does not have a lot of options right now and will need to continue amiodarone.  - She is now off steroids.   - Continue methimazole.  6. AKI: On CKD stage 3.  Suspect due to shock. Creatinine stable at 1.6 today. BUN climbing now 95 - Watch closely with diuresis  7. Aortic stenosis: Probably moderate.  8. Acute hypoxemic respiratory failure/HCAP: Multifactorial. Sputum cx back today with MRSA and citerobacter. On vanc/cefepime for VAP. CCM following.  She is a prior smoker.  Self-extubated 10/23 and re-intubated. Afebrile, PCT 0.45=>0.49. => 0.21 -  Continue vanc/cefipime - Wean as tolerated. D/w CCM 9. UTI - on abx 10. Hypokalemia - supp  CRITICAL CARE Performed by: Glori Bickers  Total critical care time: 40 minutes  Critical care time was exclusive of separately billable procedures and treating other patients.  Critical care was necessary to treat or prevent imminent or life-threatening deterioration.  Critical care was time spent personally by me on the following activities: development of treatment plan with patient and/or surrogate as well as nursing, discussions with consultants, evaluation of patient's response to treatment, examination of patient, obtaining history from patient or surrogate, ordering and performing treatments and interventions, ordering and review of laboratory studies, ordering and review of radiographic studies, pulse oximetry and re-evaluation of patient's condition.   Length of Stay: [place], MD  03/04/2019, 12:24 PM  Advanced Heart Failure Team Pager [phone] (M-F; 7a - 4p)  Please contact [facility] for night-coverage after hours (4p -7a ) and weekends on [email or web address]

## 2019-03-04 NOTE — Progress Notes (Signed)
Spoke with pt's niece Judith [name]).  Judith [name] stated that pt's POA is [employer] (pt's daughter) NOT Tye [place] [employer] (pt's sister).  Judith [name] stated there are no legal documents indicating pt's true POA.  Pt's daughter, Judith [name], phone number:  [phone] would like to be notified of ALL procedures that pt undergoes.

## 2019-03-04 NOTE — Progress Notes (Signed)
Pharmacy Antibiotic Note  Judith [name] is a 68 y.o. female admitted on 03/08/2019 with chest pain and shortness of breath found to be in cardiogenic shock.    Patient now with increasing leukocytosis (15), tmax 100.1, and CXR concerning for infiltrates. New orders to pan culture and start empiric antibiotics with vancomycin and cefepime. Scr slightly impaired but stable at 1.57 today.   Vancomycin 2g IV now then 1250mg  IV Q 48 hrs. Goal AUC 400-550. Expected AUC: 495 SCr used: 1.57  Plan: Cefepime 2g q12 hours Vancomycin 2g x1 then 1250 q48 hours Check vancomycin levels as indicated  Height: 5\' 4"  (162.6 cm) Weight: 188 lb 0.8 oz (85.3 kg) IBW/kg (Calculated) : 54.7  Temp (24hrs), Avg:99.5 F (37.5 C), Min:98.9 F (37.2 C), Max:100.1 F (37.8 C)  Recent Labs  Lab 02/25/19 1735 02/25/19 2012  02/26/19 0902  02/28/19 0455 03/01/19 0358 03/02/19 0421 03/03/19 0351 03/03/19 0400 03/04/19 0038 03/04/19 0308  WBC  --   --    < >  --    < > 11.6* 13.0* 10.6* 11.0*  --   --  15.2*  CREATININE 2.04*  --    < >  --    < > 2.32* 1.87* 1.56* 1.69*  --  1.57*  --   LATICACIDVEN 1.5 2.3*  --  1.2  --   --   --  1.3  --  1.1  --   --    < > = values in this interval not displayed.    Estimated Creatinine Clearance: 37.2 mL/min (A) (by C-G formula based on SCr of 1.57 mg/dL (H)).    Allergies  Allergen Reactions  . Contrast Media [Iodinated Diagnostic Agents] Hives and Other (See Comments)    Spoke to patient, Iodine allergy is really IV contrast allergy.   [name] [Insulin Aspart] Shortness Of Breath and Other (See Comments)    "breathing problems"  . Codeine Nausea And Vomiting and Other (See Comments)    HIGH DOSES-SEVERE VOMITING  . Iodine Other (See Comments)    MUST HAVE BENADRYL PRIOR TO PROCEDURE AND RIGHT BEFORE TREATMENT TO COUNTERACT REACTION-BLISTERING REACTION DERMATOLOGICAL  . Penicillins Itching, Rash and Other (See Comments)    Has patient had a PCN reaction  causing immediate rash, facial/tongue/throat swelling, SOB or lightheadedness with hypotension: no Has patient had a PCN reaction causing severe rash involving mucus membranes or skin necrosis: No Has patient had a PCN reaction that required hospitalization No Has patient had a PCN reaction occurring within the last 10 years: No If all of the above answers are "NO", then may proceed with Cephalosporin use.  CHEST SIZED RASH AND ITCHING   . Ace Inhibitors Cough  . Demerol [Meperidine] Nausea And Vomiting  . Dilaudid [Hydromorphone Hcl] Other (See Comments)    HEADACHE   . Neosporin [Neomycin-Bacitracin Zn-Polymyx] Itching, Rash and Other (See Comments)    MAKES REACTIONS WORSE WHEN USING AS PROPHYLACTIC  . Percocet [Oxycodone-Acetaminophen] Rash  . Tape Itching and Rash    Thank you for allowing pharmacy to be a part of this patient's care.  Erin Hearing PharmD., BCPS Clinical Pharmacist 03/04/2019 9:05 AM

## 2019-03-04 NOTE — Progress Notes (Signed)
[facility] for Heparin  Indication: atrial fibrillation  Allergies  Allergen Reactions  . Contrast Media [Iodinated Diagnostic Agents] Hives and Other (See Comments)    Spoke to patient, Iodine allergy is really IV contrast allergy.   [name] [Insulin Aspart] Shortness Of Breath and Other (See Comments)    "breathing problems"  . Codeine Nausea And Vomiting and Other (See Comments)    HIGH DOSES-SEVERE VOMITING  . Iodine Other (See Comments)    MUST HAVE BENADRYL PRIOR TO PROCEDURE AND RIGHT BEFORE TREATMENT TO COUNTERACT REACTION-BLISTERING REACTION DERMATOLOGICAL  . Penicillins Itching, Rash and Other (See Comments)    Has patient had a PCN reaction causing immediate rash, facial/tongue/throat swelling, SOB or lightheadedness with hypotension: no Has patient had a PCN reaction causing severe rash involving mucus membranes or skin necrosis: No Has patient had a PCN reaction that required hospitalization No Has patient had a PCN reaction occurring within the last 10 years: No If all of the above answers are "NO", then may proceed with Cephalosporin use.  CHEST SIZED RASH AND ITCHING   . Ace Inhibitors Cough  . Demerol [Meperidine] Nausea And Vomiting  . Dilaudid [Hydromorphone Hcl] Other (See Comments)    HEADACHE   . Neosporin [Neomycin-Bacitracin Zn-Polymyx] Itching, Rash and Other (See Comments)    MAKES REACTIONS WORSE WHEN USING AS PROPHYLACTIC  . Percocet [Oxycodone-Acetaminophen] Rash  . Tape Itching and Rash    Patient Measurements: Height: 5\' 4"  (162.6 cm) Weight: 188 lb 0.8 oz (85.3 kg) IBW/kg (Calculated) : 54.7 Heparin Dosing Weight: 75kg  Vital Signs: Temp: 98.9 F (37.2 C) (10/25 0832) Temp Source: Oral (10/25 0832) BP: 124/79 (10/25 0700) Pulse Rate: 114 (10/25 0700)  Labs: Recent Labs    03/02/19 0421 03/02/19 0422  03/03/19 0351 03/03/19 1211 03/04/19 0038 03/04/19 0308  HGB 9.4*  --   --  9.4*  --   --   9.6*  HCT 30.9*  --   --  30.8*  --   --  31.7*  PLT 278  --   --  280  --   --  274  APTT 52*  --    < > 53* 65*  --  86*  LABPROT 20.3*  --   --   --   --   --   --   INR 1.8*  --   --   --   --   --   --   HEPARINUNFRC  --  1.02*  --  0.91*  --   --  0.80*  CREATININE 1.56*  --   --  1.69*  --  1.57*  --    < > = values in this interval not displayed.    Estimated Creatinine Clearance: 37.2 mL/min (A) (by C-G formula based on SCr of 1.57 mg/dL (H)).  Assessment: 68yof admitted for SOB in setting of Afib RVR was supposed to have DCCV but deferred because patient missed doses of apixaban prior. She developed hypotension, cardiogenic shock. Previously on apixaban last dose 10/17 pm.  aPTT= 86s this morning, heparin level still elevated d/t recent apixaban use but close to correlating at 0.8, hgb stable in 9s, plt normal. No bleeding issues noted.   May be able to follow heparin levels alone pending level tomorrow.   Failed repeat dccv 10/23, back in afib.   Goal of Therapy:  Heparin level 0.3-0.7 aPTT aptt 66-90 sec seconds Monitor platelets by anticoagulation protocol: Yes   Plan:  -  Continue heparin to 1550 units/hr -aPTT, heparin level and CBC in am  Erin Hearing PharmD., BCPS Clinical Pharmacist 03/04/2019 9:06 AM

## 2019-03-04 NOTE — Progress Notes (Signed)
eLink Physician-Brief Progress Note Patient Name: Judith [name] DOB: 29-Nov-1950 MRN: [id number]   Date of Service  03/04/2019  HPI/Events of Note  Hyperglycemia - Blood glucose = 207 --> 202 --> 274. Patient currently on a Q 4 hour standard Novolog SSI. Medical record states that the patient is allergic to Novolog. However, this is doubtful as she has been on Novolog while in the hospital without adverse incident.   eICU Interventions  Will order: 1. Change to Q 4 hour moderate Novolog SSI + 5 units Q 4 hours for enteral feed coverage.      Intervention Category Major Interventions: Hyperglycemia - active titration of insulin therapy  Sommer,Steven Eugene 03/04/2019, 1:31 AM

## 2019-03-04 NOTE — Progress Notes (Signed)
NAME:  DARRIANNA BLONDER, MRN:  [id number], DOB:  02/06/1951, LOS: 76 ADMISSION DATE:  03/02/2019, CONSULTATION DATE:  03/04/2019 REFERRING MD:  Dr. Rayann Heman, CHIEF COMPLAINT:  Respiratory Failure    Brief History   Admitted 10/16, became more SOB 10/17, still in AFib despite amiodarone and dilt. Taken for cardioversion but became more hypotensive, requiring pressor support, intubated. Called for vent management; assistance with pressors. Stay complicated by ongoing cardiogenic shock. Self Extubated 10/22. However required immediate reintubation.   History of present illness   68 year old female presents to ED on 10/17 with chest pain and shortness of breath. Was scheduled for cardioversion on 10/12 but that was canceled since patient had missed her Eliquis dosage for 3 consecutive days prior to that. Was found to be in A.Fib RVR. CXR with Cardiomegaly with no infiltrate. Taken for Cardioversion on 10/17. Required intubated at this time due to respiratory distress presumed secondary to cardiogenic shock. PCCM asked to consult.    Past Medical History  paroxysmal afib, CHF, COPD, Hyperthyroidism, IDDM, CKD stage III  Significant Hospital Events   10/17 > Presents to ED < Take for Cardioversion, Intubated  10/19 > Severe vent asynchrony overnight,  Vecuronium x1 on 2 consecutive nights, changed to pressure control 10/20 > titrating pressors and inotrope support.  Had been on bicarb gtt this was stopped.  10/21 > Hyperglycemic overnight; insulin gtt started. Off norepinephrine, Co-ox 73% on dobutamine. [place] a little better. Back in AF. Decreasing dobutamine to 3. Lasix 80mg   f/b gtt started. Failed SBT attempt.  10/22 > Self-Extubated, Required Re-intubation   Consults:  Cardiology EP PCCM Heart Failure   Procedures:  10/17 > A.Line >> 10/17 > RIJ CVC >> 10/17 > ETT >> 10/22  10/22 > ETT >>   Significant Diagnostic Tests:  10/18: ULTRASOUND ABDOMEN LIMITED RIGHT UPPER QUADRANT > Mild  gallbladder wall thickening up to 7 mm, which could be related to incomplete distension. No other sonographic features for acute cholecystitis.Previously seen cholelithiasis not visualized on today's exam. No biliary dilatation or other acute finding. 10/17 TTE: Left ventricular ejection fraction, by visual estimation, is 30 to 35%. The left ventricle has moderate to severely decreased function. Normal left ventricular size. There is mildly increased left ventricular hypertrophy.Wall motion anlysis was limited (parasternal and apical-4-chamber views only), but there is global LV hypokinesis with severe inferosptal hypokinesis.Left ventricular diastolic function could not be evaluated due to atrial fibrillation. Left atrial size was severely dilated. Right atrial size was moderately dilated. Moderate to severe mitral annular calcification. The mitral valve is degenerative. Moderate mitral valve regurgitation. Tricuspid valve regurgitation mild-moderate. Mild to moderate aortic valve sclerosis/calcification. Overall impression is of moderate aortic stenosis.The right ventricle has mildly reduced global systolic function.The right ventricular size is normal. No increase in right ventricular wall thickness. Compared to January 2020, left ventricular systolic function has deteriorated globally, but there also appears to be new inferoseptal severe hypokinesis, disproportionate to the remainder of the LV. Mitral regurgitation has worsened and aortic valve  gradients are lower due to poor cardiac output.  Micro Data:  COVID Negative  HIV Negative MRSA Negative   Antimicrobials:  N/A    Interim history/subjective:    Objective   Blood pressure 124/79, pulse (!) 114, temperature 100.1 F (37.8 C), temperature source Axillary, resp. rate 18, height 5\' 4"  (1.626 m), weight 85.3 kg, SpO2 97 %.    Vent Mode: PSV;CPAP FiO2 (%):  [40 %] 40 % Set Rate:  [18 bmp]  18 bmp PEEP:  [5 cmH20] 5 cmH20 Pressure  Support:  [10 cmH20] [place] Pressure:  [15 S192499 cmH20] 18 cmH20   Intake/Output Summary (Last 24 hours) at 03/04/2019 0745 Last data filed at 03/04/2019 0700 Gross per 24 hour  Intake 2236.55 ml  Output 3075 ml  Net -838.45 ml   Filed Weights   03/02/19 0500 03/03/19 0500 03/04/19 0500  Weight: 84.1 kg 84.3 kg 85.3 kg    Examination: General: Female on vent, no distress noted  HENT: ETT/OG in place  Lungs: diminished breath sounds to bases, no crackles/wheeze Cardiovascular: irregular, no MRG Abdomen: soft, non-distended, active bowel sounds Extremities: +1 BLE edema  Neuro: opens eyes to verbal command, lethargic, pupils intact  GU: foley in place   Resolved Hospital Problem list     Assessment & Plan:   Sedation Needs in setting of Intubated State H/O Anxiety/Depression  Plan -PRN Fentanyl/Versed for RASS goal 0/-2  -Continue Prozac, Wellbutrin    Cardiogenic Shock in setting of Acute on Chronic Diastolic Heart Failure -EF 30-35 -Dobutamine gtt off 10/23 -Cardioverted 10/23  A.Fib RVR Aortic Stenosis  H/O HLD, HTN Plan -Heart Failure Following >> Future AV Nodal Alblation/BiV pacing ?? >> Plan for Cardioversion today ??  -Continue Lasix gtt per Heart Failure, Trend CVP, Trend COOX (SVO2 64 this AM)  -Continue Amiodarone and Heparin gtt  -Trend BNP  Acute Hypoxic Respiratory Distress in setting of Cardiogenic Shock and Pulmonary Edema  H/O Tobacco Abuse, no formal diagnosis for COPD Plan  -Vent Support, Wean as tolerated >> currently on PS 5/5 -Trend CXR/ABG -Pulmonary Hygiene  -Continue Scheduled Nebs   Acute on Chronic Renal Disease  Metabolic Acidosis >> Resolved  Plan -Trend BMP -Replace electrolytes as indicated -Avoid Nephrotoxic Medications   Shock Liver, Transaminases in setting of Cardiogenic Shock  Plan  -Trend LFT, Slowly Improving  -Continue to Hold Statin   Increasing Leucocytosis, Low Grade Fever, ?reactive vs PNA (CXR  with atelectasis vs infiltrates to bases)  Plan  -Trend WBC and Fever Curve -PAN Culture  -Start Cefepime/Vanomcyin at this time  -Send PCT   Hyperglycemia  H/O DM Plan  -Trend Glucose, SSI -TF coverage added overnight  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 Plan  -Methimazole was resumed 10/19  Best practice:  Diet: TF Pain/Anxiety/Delirium protocol VAP protocol DVT prophylaxis: Heparin  GI prophylaxis: PPI Glucose control: Insulin  Mobility: Bedrest  Code Status: FC Family Communication: Family updated at bedside Disposition: Continue ICU Care   Labs   CBC: Recent Labs  Lab 02/28/19 0455 02/28/19 1247 03/01/19 0358 03/02/19 0421 03/03/19 0351 03/04/19 0308  WBC 11.6*  --  13.0* 10.6* 11.0* 15.2*  HGB 9.8* 11.2* 10.3* 9.4* 9.4* 9.6*  HCT 31.6* 33.0* 33.0* 30.9* 30.8* 31.7*  MCV 85.6  --  84.4 84.4 83.7 83.0  PLT 337  --  322 278 280 [id number]    Basic Metabolic Panel: Recent Labs  Lab 02/27/19 0353  02/28/19 0455 02/28/19 1247 03/01/19 0358 03/02/19 0421 03/03/19 0351 03/04/19 0038 03/04/19 0308  NA 130*   < > 130* 130* 132* 130* 132* 132*  --   K 4.0   < > 3.8 3.7 3.4* 3.7 4.1 4.2  --   CL 89*  --  90*  --  87* 85* 85* 86*  --   CO2 27  --  27  --  32 33* 32 33*  --   GLUCOSE 264*  --  241*  --  261* 187* 147* 279*  --   BUN 80*  --  96*  --  92* 86* 91* 95*  --   CREATININE 2.65*  --  2.32*  --  1.87* 1.56* 1.69* 1.57*  --   CALCIUM 8.5*  --  8.5*  --  9.1 8.8* 9.0 8.7*  --   MG 2.3  --   --   --   --  1.8 2.3  --  1.9  PHOS  --   --   --   --   --  2.3* 4.1  --  3.1   < > = values in this interval not displayed.   GFR: Estimated Creatinine Clearance: 37.2 mL/min (A) (by C-G formula based on SCr of 1.57 mg/dL (H)). Recent Labs  Lab 02/25/19 2012  02/26/19 0902  03/01/19 0358 03/01/19 0943 03/02/19 0421 03/03/19 0351 03/03/19 0400 03/04/19 0308  PROCALCITON  --   --    --   --   --  0.45 0.49 0.49  --   --   WBC  --    < >  --    < > 13.0*  --  10.6* 11.0*  --  15.2*  LATICACIDVEN 2.3*  --  1.2  --   --   --  1.3  --  1.1  --    < > = values in this interval not displayed.    Liver Function Tests: Recent Labs  Lab 02/28/19 0455 03/01/19 0358 03/02/19 0421 03/03/19 0351 03/04/19 0308  AST 220* 105* 64* 66* 69*  ALT 556* 355* 201* 140* 111*  ALKPHOS 131* 138* 123 127* 152*  BILITOT 0.9 1.1 1.1 0.8 0.7  PROT 5.6* 6.2* 5.7* 6.0* 5.8*  ALBUMIN 2.4* 2.5* 2.1* 2.0* 1.8*   No results for input(s): LIPASE, AMYLASE in the last 168 hours. No results for input(s): AMMONIA in the last 168 hours.  ABG    Component Value Date/Time   PHART 7.412 02/28/2019 1247   PCO2ART 49.9 (H) 02/28/2019 1247   PO2ART 65.0 (L) 02/28/2019 1247   HCO3 31.8 (H) 02/28/2019 1247   TCO2 33 (H) 02/28/2019 1247   ACIDBASEDEF 5.0 (H) 02/23/2019 1706   O2SAT 64.0 03/04/2019 0455     Coagulation Profile: Recent Labs  Lab 03/02/19 0421  INR 1.8*    Cardiac Enzymes: No results for input(s): CKTOTAL, CKMB, CKMBINDEX, TROPONINI in the last 168 hours.  HbA1C: Hemoglobin A1C  Date/Time Value Ref Range Status  02/13/2019 01:10 PM 7.3 (A) 4.0 - 5.6 % Final   Hgb A1c MFr Bld  Date/Time Value Ref Range Status  03/05/2019 07:54 PM 8.1 (H) 4.8 - 5.6 % Final    Comment:    (NOTE) Pre diabetes:          5.7%-6.4% Diabetes:              >6.4% Glycemic control for   <7.0% adults with diabetes   10/18/2018 11:35 AM 6.6 (H) 4.8 - 5.6 % Final    Comment:             Prediabetes: 5.7 - 6.4          Diabetes: >6.4          Glycemic control for adults with diabetes: <7.0     CBG: Recent Labs  Lab 03/03/19 1609 03/03/19 2009 03/04/19 0045 03/04/19 0143 03/04/19 0350  GLUCAP 207* 202* 274* 243* 260*    Past Medical History  She,  has a past medical history of Abnormal EKG (07/31/2013), Arthritis, Asthma, Carpal tunnel syndrome, bilateral, Chronic kidney disease,  Complication of anesthesia, Coronary artery disease, Diabetic peripheral neuropathy ([place]) ("since 1996"), GERD (gastroesophageal reflux disease), Goiter, Headache, History of shingles (06/01/2013), Hyperlipidemia LDL goal <70 (10/13/2015), Hypertension, Hyperthyroidism, Osteomyelitis of foot ([place]), PAF (paroxysmal atrial fibrillation) ([place]) (04/29/2015), Pneumonia (~ 1976), Sleep apnea, Tremors of nervous system, and Type II diabetes mellitus ([place]).   Surgical History    Past Surgical History:  Procedure Laterality Date  . ABDOMINAL HYSTERECTOMY  1988   age 77; CERVICAL DYSPLASIA; ovaries intact.   . AMPUTATION Right 01/23/2016   Procedure: Right 3rd Ray Amputation;  Surgeon: [name], MD;  Location: [place];  Service: Orthopedics;  Laterality: Right;  . AMPUTATION Right 02/13/2016   Procedure: Right Transmetatarsal Amputation;  Surgeon: [name], MD;  Location: [place];  Service: Orthopedics;  Laterality: Right;  . AMPUTATION Right 04/15/2018   Procedure: AMPUTATION BELOW KNEE;  Surgeon: [name], MD;  Location: [place];  Service: Orthopedics;  Laterality: Right;  . BIOPSY  04/16/2018   Procedure: BIOPSY;  Surgeon: Irene Shipper, MD;  Location: [facility] ENDOSCOPY;  Service: Endoscopy;;  . CARDIAC CATHETERIZATION N/A 02/27/2015   Procedure: Left Heart Cath and Coronary Angiography;  Surgeon: [name], MD; LAD 40%, mCFX 80%, OM 70%, RCA 100% calcified       . CARDIAC CATHETERIZATION N/A 03/06/2015   Procedure: Coronary Stent Intervention;  Surgeon: [name], MD;  Location: [place] [facility];  Service: Cardiovascular;  Laterality: N/A;  Mid CX 3.50x12 promus DES w/ 0% resdual and Prox OM1 2.50x20 promus DES w/ 20% residual  . CARDIOVERSION    . CARDIOVERSION N/A 05/22/2018   Procedure: CARDIOVERSION;  Surgeon: Skeet Latch, MD;  Location[facility];  Service: Cardiovascular;  Laterality: N/A;  . CARDIOVERSION N/A 03/03/2019   Procedure: CARDIOVERSION;  Surgeon: Thompson Grayer,  MD;  Location: [place];  Service: Cardiovascular;  Laterality: N/A;  . CARPAL TUNNEL RELEASE Right Nov 2015  . CARPAL TUNNEL RELEASE Right 1992; 05/2014   [name]; [place]  . CESAREAN SECTION  1982; 1984  . ESOPHAGOGASTRODUODENOSCOPY (EGD) WITH PROPOFOL N/A 04/16/2018   Procedure: ESOPHAGOGASTRODUODENOSCOPY (EGD) WITH PROPOFOL;  Surgeon: Irene Shipper, MD;  Location: [facility] ENDOSCOPY;  Service: Endoscopy;  Laterality: N/A;  . FOOT NEUROMA SURGERY Bilateral 2000  . I&D EXTREMITY Left 01/06/2018   Procedure: DEBRIDEMENT ULCER LEFT FOOT;  Surgeon: [name], MD;  Location: [place];  Service: Orthopedics;  Laterality: Left;  . KNEE ARTHROSCOPY Right ~ 2003   "meniscus repair"  . LIPOMA EXCISION Right 2020   thigh  . SHOULDER OPEN ROTATOR CUFF REPAIR Right 1996; 1998   "w/fracture repair"  . THYROID SURGERY  2000   "removed lots of nodules"  . TONSILLECTOMY  1976     Social History   reports that she quit smoking about 4 years ago. Her smoking use included cigarettes. She smoked 0.00 packs per day for 41.00 years. She has never used smokeless tobacco. She reports that she does not drink alcohol or use drugs.   Family History   Her family history includes Allergies in her sister; Breast cancer in her maternal grandmother and mother; COPD in her father; Cancer (age of onset: 68) in her mother; Emphysema in her maternal grandfather and paternal grandfather; Heart attack in her father; Heart disease (age of onset: 23) in her father; Hypertension in her father; Leukemia in her paternal grandmother;  Lung cancer in her mother; Parkinson's disease in her father. There is no history of Thyroid disease, Colon cancer, Stomach cancer, Rectal cancer, or Esophageal cancer.   Allergies Allergies  Allergen Reactions  . Contrast Media [Iodinated Diagnostic Agents] Hives and Other (See Comments)    Spoke to patient, Iodine allergy is really IV contrast allergy.   [name] [Insulin Aspart] Shortness Of Breath  and Other (See Comments)    "breathing problems"  . Codeine Nausea And Vomiting and Other (See Comments)    HIGH DOSES-SEVERE VOMITING  . Iodine Other (See Comments)    MUST HAVE BENADRYL PRIOR TO PROCEDURE AND RIGHT BEFORE TREATMENT TO COUNTERACT REACTION-BLISTERING REACTION DERMATOLOGICAL  . Penicillins Itching, Rash and Other (See Comments)    Has patient had a PCN reaction causing immediate rash, facial/tongue/throat swelling, SOB or lightheadedness with hypotension: no Has patient had a PCN reaction causing severe rash involving mucus membranes or skin necrosis: No Has patient had a PCN reaction that required hospitalization No Has patient had a PCN reaction occurring within the last 10 years: No If all of the above answers are "NO", then may proceed with Cephalosporin use.  CHEST SIZED RASH AND ITCHING   . Ace Inhibitors Cough  . Demerol [Meperidine] Nausea And Vomiting  . Dilaudid [Hydromorphone Hcl] Other (See Comments)    HEADACHE   . Neosporin [Neomycin-Bacitracin Zn-Polymyx] Itching, Rash and Other (See Comments)    MAKES REACTIONS WORSE WHEN USING AS PROPHYLACTIC  . Percocet [Oxycodone-Acetaminophen] Rash  . Tape Itching and Rash     Home Medications  Prior to Admission medications   Medication Sig Start Date End Date Taking? Authorizing Provider  albuterol (PROVENTIL HFA) 108 (90 Base) MCG/ACT inhaler Inhale 1-2 puffs into the lungs every 6 (six) hours as needed for wheezing or shortness of breath. 05/01/18  Yes Angiulli, Lavon Paganini, PA-C  amiodarone (PACERONE) 200 MG tablet Take 1 tablet (200 mg total) by mouth daily. 06/08/18  Yes Sherran Needs, NP  atorvastatin (LIPITOR) 20 MG tablet Take 1 tablet (20 mg total) by mouth daily. 05/01/18  Yes Angiulli, Lavon Paganini, PA-C  buPROPion (WELLBUTRIN) 75 MG tablet Take 75 mg by mouth 2 (two) times daily.   Yes [provider]  cetirizine (ZYRTEC) 10 MG tablet Take 10 mg by mouth daily as needed for allergies.    Yes  [provider]  diltiazem (CARTIA XT) 120 MG 24 hr capsule Take 1 capsule (120 mg total) by mouth daily. 02/12/19  Yes Bhagat, Bhavinkumar, PA  ELIQUIS 5 MG TABS tablet Take 1 tablet by mouth twice daily Patient taking differently: Take 5 mg by mouth 2 (two) times daily.  11/21/18  Yes Turner, Eber Hong, MD  famotidine (PEPCID) 20 MG tablet Take 1 tablet (20 mg total) by mouth 2 (two) times daily. Patient taking differently: Take 40 mg by mouth daily.  06/17/18  Yes Rutherford Guys, MD  ferrous sulfate 325 (65 FE) MG tablet Take 325 mg by mouth daily as needed (Low iron).   Yes [provider]  FLUoxetine (PROZAC) 20 MG capsule TAKE 1 CAPSULE AT BEDTIME Patient taking differently: Take 20 mg by mouth at bedtime.  01/03/19  Yes Rutherford Guys, MD  fluticasone (FLOVENT HFA) 44 MCG/ACT inhaler Inhale 2 puffs into the lungs 2 (two) times daily. Patient taking differently: Inhale 2 puffs into the lungs daily as needed (Allergies).  01/22/19  Yes Rutherford Guys, MD  furosemide (LASIX) 40 MG tablet Take 1 tablet (  40 mg total) by mouth daily. 01/18/19  Yes Rutherford Guys, MD  hydrALAZINE (APRESOLINE) 50 MG tablet Take 1 tablet (50 mg total) by mouth 3 (three) times daily. 10/13/18  Yes Isaiah Serge, NP  insulin degludec (TRESIBA FLEXTOUCH) 100 UNIT/ML SOPN FlexTouch Pen Inject 20 Units into the skin at bedtime.    Yes [provider]  ketoconazole (NIZORAL) 2 % cream Apply 1 application topically daily. 02/21/19  Yes Maximiano Coss, NP  loratadine (CLARITIN) 10 MG tablet Take 10 mg by mouth at bedtime.   Yes [provider]  magnesium oxide (MAG-OX) 400 MG tablet Take 400 mg by mouth at bedtime.    Yes [provider]  Melatonin 10 MG TABS Take 10 mg by mouth at bedtime as needed (Sleep).    Yes [provider]  metFORMIN (GLUCOPHAGE-XR) 500 MG 24 hr tablet Take 1,000 mg by mouth daily with breakfast.   Yes [provider]  methimazole  (TAPAZOLE) 10 MG tablet TAKE 1 TABLET (10 MG TOTAL) BY MOUTH DAILY. Patient taking differently: Take 10 mg by mouth daily.  02/11/2019  Yes Renato Shin, MD  metoprolol succinate (TOPROL-XL) 25 MG 24 hr tablet Take 50 mg by mouth 2 (two) times daily.    Yes [provider]  mupirocin ointment (BACTROBAN) 2 % Apply 1 application topically 2 (two) times daily as needed (wound/sores).    Yes [provider]  nitroGLYCERIN (NITROSTAT) 0.4 MG SL tablet Place 0.4 mg under the tongue every 5 (five) minutes x 3 doses as needed for chest pain.    Yes [provider]  polyethylene glycol powder (GLYCOLAX/MIRALAX) 17 GM/SCOOP powder Take 17 g by mouth daily as needed (constipation.).    Yes [provider]  potassium chloride (K-DUR) 10 MEQ tablet Take 1 tablet (10 mEq total) by mouth 2 (two) times daily. Patient taking differently: Take 10 mEq by mouth every other day.  01/18/19  Yes Rutherford Guys, MD  triamcinolone (KENALOG) 0.1 % paste Use as directed 1 application in the mouth or throat 2 (two) times daily as needed (ulcer lesion.). 02/05/19  Yes Rutherford Guys, MD  TRUE METRIX BLOOD GLUCOSE TEST test strip TEST BLOOD SUGAR TWICE DAILY 09/16/18  Yes Renato Shin, MD     Critical care time: [place], AGACNP-BC [facility]  PCCM Pgr: [phone]

## 2019-03-05 ENCOUNTER — Inpatient Hospital Stay (HOSPITAL_COMMUNITY): Payer: Medicare PPO

## 2019-03-05 DIAGNOSIS — J9600 Acute respiratory failure, unspecified whether with hypoxia or hypercapnia: Secondary | ICD-10-CM

## 2019-03-05 LAB — BASIC METABOLIC PANEL
Anion gap: 12 (ref 5–15)
BUN: 92 mg/dL — ABNORMAL HIGH (ref 8–23)
CO2: 33 mmol/L — ABNORMAL HIGH (ref 22–32)
Calcium: 8.7 mg/dL — ABNORMAL LOW (ref 8.9–10.3)
Chloride: 88 mmol/L — ABNORMAL LOW (ref 98–111)
Creatinine, Ser: 1.45 mg/dL — ABNORMAL HIGH (ref 0.44–1.00)
GFR calc Af Amer: 43 mL/min — ABNORMAL LOW (ref >60)
GFR calc non Af Amer: 37 mL/min — ABNORMAL LOW (ref >60)
Glucose, Bld: 216 mg/dL — ABNORMAL HIGH (ref 70–99)
Potassium: 3.6 mmol/L (ref 3.5–5.1)
Sodium: 133 mmol/L — ABNORMAL LOW (ref 135–145)

## 2019-03-05 LAB — CBC
HCT: 30.8 % — ABNORMAL LOW (ref 36.0–46.0)
Hemoglobin: 9.6 g/dL — ABNORMAL LOW (ref 12.0–15.0)
MCH: 25.6 pg — ABNORMAL LOW (ref 26.0–34.0)
MCHC: 31.2 g/dL (ref 30.0–36.0)
MCV: 82.1 fL (ref 80.0–100.0)
Platelets: 286 10*3/uL (ref 150–400)
RBC: 3.75 MIL/uL — ABNORMAL LOW (ref 3.87–5.11)
RDW: 17.3 % — ABNORMAL HIGH (ref 11.5–15.5)
WBC: 17.8 10*3/uL — ABNORMAL HIGH (ref 4.0–10.5)
nRBC: 0 % (ref 0.0–0.2)

## 2019-03-05 LAB — GLUCOSE, CAPILLARY
Glucose-Capillary: 119 mg/dL — ABNORMAL HIGH (ref 70–99)
Glucose-Capillary: 144 mg/dL — ABNORMAL HIGH (ref 70–99)
Glucose-Capillary: 151 mg/dL — ABNORMAL HIGH (ref 70–99)
Glucose-Capillary: 152 mg/dL — ABNORMAL HIGH (ref 70–99)
Glucose-Capillary: 155 mg/dL — ABNORMAL HIGH (ref 70–99)
Glucose-Capillary: 162 mg/dL — ABNORMAL HIGH (ref 70–99)
Glucose-Capillary: 184 mg/dL — ABNORMAL HIGH (ref 70–99)
Glucose-Capillary: 223 mg/dL — ABNORMAL HIGH (ref 70–99)

## 2019-03-05 LAB — POCT I-STAT 7, (LYTES, BLD GAS, ICA,H+H)
Acid-Base Excess: 9 mmol/L — ABNORMAL HIGH (ref 0.0–2.0)
Bicarbonate: 37.3 mmol/L — ABNORMAL HIGH (ref 20.0–28.0)
Calcium, Ion: 1.16 mmol/L (ref 1.15–1.40)
HCT: 35 % — ABNORMAL LOW (ref 36.0–46.0)
Hemoglobin: 11.9 g/dL — ABNORMAL LOW (ref 12.0–15.0)
O2 Saturation: 100 %
Potassium: 5.2 mmol/L — ABNORMAL HIGH (ref 3.5–5.1)
Sodium: 132 mmol/L — ABNORMAL LOW (ref 135–145)
TCO2: 40 mmol/L — ABNORMAL HIGH (ref 22–32)
pCO2 arterial: 73.3 mmHg (ref 32.0–48.0)
pH, Arterial: 7.315 — ABNORMAL LOW (ref 7.350–7.450)
pO2, Arterial: 258 mmHg — ABNORMAL HIGH (ref 83.0–108.0)

## 2019-03-05 LAB — APTT: aPTT: 68 seconds — ABNORMAL HIGH (ref 24–36)

## 2019-03-05 LAB — COOXEMETRY PANEL
Carboxyhemoglobin: 1.8 % — ABNORMAL HIGH (ref 0.5–1.5)
Methemoglobin: 1.2 % (ref 0.0–1.5)
O2 Saturation: 69.1 %
Total hemoglobin: 17.1 g/dL — ABNORMAL HIGH (ref 12.0–16.0)

## 2019-03-05 LAB — MAGNESIUM: Magnesium: 2.2 mg/dL (ref 1.7–2.4)

## 2019-03-05 LAB — HEPARIN LEVEL (UNFRACTIONATED): Heparin Unfractionated: 0.64 IU/mL (ref 0.30–0.70)

## 2019-03-05 LAB — PHOSPHORUS: Phosphorus: 2.6 mg/dL (ref 2.5–4.6)

## 2019-03-05 MED ORDER — INSULIN ASPART 100 UNIT/ML ~~LOC~~ SOLN
2.0000 [IU] | SUBCUTANEOUS | Status: DC
  Administered 2019-03-05: 4 [IU] via SUBCUTANEOUS
  Administered 2019-03-05: 6 [IU] via SUBCUTANEOUS
  Administered 2019-03-05 (×2): 4 [IU] via SUBCUTANEOUS
  Administered 2019-03-05: 2 [IU] via SUBCUTANEOUS
  Administered 2019-03-06 – 2019-03-07 (×3): 4 [IU] via SUBCUTANEOUS

## 2019-03-05 MED ORDER — POTASSIUM CHLORIDE 20 MEQ PO PACK
40.0000 meq | PACK | Freq: Two times a day (BID) | ORAL | Status: DC
  Administered 2019-03-05: 40 meq
  Filled 2019-03-05 (×2): qty 2

## 2019-03-05 MED ORDER — INSULIN DETEMIR 100 UNIT/ML ~~LOC~~ SOLN
10.0000 [IU] | Freq: Two times a day (BID) | SUBCUTANEOUS | Status: DC
  Administered 2019-03-05 – 2019-03-06 (×3): 10 [IU] via SUBCUTANEOUS
  Filled 2019-03-05 (×6): qty 0.1

## 2019-03-05 MED ORDER — METOLAZONE 2.5 MG PO TABS
2.5000 mg | ORAL_TABLET | Freq: Once | ORAL | Status: AC
  Administered 2019-03-05: 2.5 mg
  Filled 2019-03-05: qty 1

## 2019-03-05 MED ORDER — INSULIN DETEMIR 100 UNIT/ML ~~LOC~~ SOLN
5.0000 [IU] | Freq: Two times a day (BID) | SUBCUTANEOUS | Status: DC
  Administered 2019-03-05: 5 [IU] via SUBCUTANEOUS
  Filled 2019-03-05: qty 0.05

## 2019-03-05 MED ORDER — FUROSEMIDE 10 MG/ML IJ SOLN
100.0000 mg | Freq: Once | INTRAVENOUS | Status: AC
  Administered 2019-03-06: 100 mg via INTRAVENOUS
  Filled 2019-03-05: qty 10

## 2019-03-05 MED ORDER — INSULIN ASPART 100 UNIT/ML ~~LOC~~ SOLN
1.0000 [IU] | SUBCUTANEOUS | Status: DC
  Administered 2019-03-05 (×5): 1 [IU] via SUBCUTANEOUS

## 2019-03-05 MED ORDER — FUROSEMIDE 10 MG/ML IJ SOLN
15.0000 mg/h | INTRAVENOUS | Status: DC
  Administered 2019-03-05: 12 mg/h via INTRAVENOUS
  Administered 2019-03-06 (×2): 15 mg/h via INTRAVENOUS
  Filled 2019-03-05 (×4): qty 25

## 2019-03-05 MED ORDER — FENTANYL 2500MCG IN NS 250ML (10MCG/ML) PREMIX INFUSION
0.0000 ug/h | INTRAVENOUS | Status: DC
  Administered 2019-03-05: 50 ug/h via INTRAVENOUS
  Administered 2019-03-06: 200 ug/h via INTRAVENOUS
  Administered 2019-03-06: 150 ug/h via INTRAVENOUS
  Filled 2019-03-05 (×4): qty 250

## 2019-03-05 MED ORDER — DEXTROSE 10 % IV SOLN
INTRAVENOUS | Status: DC | PRN
  Administered 2019-03-06: 10:00:00 via INTRAVENOUS

## 2019-03-05 NOTE — Progress Notes (Signed)
Pt back on full vent support due to increased WOB.

## 2019-03-05 NOTE — Plan of Care (Signed)
  Problem: Clinical Measurements: Goal: Ability to maintain clinical measurements within normal limits will improve Outcome: Progressing Goal: Will remain free from infection Outcome: Progressing Goal: Diagnostic test results will improve Outcome: Progressing Goal: Respiratory complications will improve Outcome: Progressing Goal: Cardiovascular complication will be avoided Outcome: Progressing   Problem: Activity: Goal: Risk for activity intolerance will decrease Outcome: Progressing   Problem: Coping: Goal: Level of anxiety will decrease Outcome: Progressing   Problem: Elimination: Goal: Will not experience complications related to bowel motility Outcome: Progressing   Problem: Pain Managment: Goal: General experience of comfort will improve Outcome: Progressing   Problem: Safety: Goal: Ability to remain free from injury will improve Outcome: Progressing   Problem: Skin Integrity: Goal: Risk for impaired skin integrity will decrease Outcome: Progressing   

## 2019-03-05 NOTE — Progress Notes (Signed)
eLink Physician-Brief Progress Note Patient Name: Judith [name] DOB: Oct 16, 1950 MRN: [id number]   Date of Service  03/05/2019  HPI/Events of Note  Multiple issues: 1. Vomited tube feeds - Increased O2 requirement. Request for CXR. Already on Vancomycin and Cefepime, and 2. No urine output on Lasix IV infusion at 12 mg/hour.   eICU Interventions  Will order: 1. Portable CXR STAT. 2. Defer urine output issue to heart failure team.      Intervention Category Major Interventions: Other:  Lysle Dingwall 03/05/2019, 9:48 PM

## 2019-03-05 NOTE — Progress Notes (Signed)
eLink Physician-Brief Progress Note Patient Name: Judith [name] DOB: 29-May-1950 MRN: [id number]   Date of Service  03/05/2019  HPI/Events of Note  Increased hypoxia - FiO2 now increased to 100%. Review of CXR reveals The endotracheal tube terminates above the carina. The right-sided central venous catheter is stable in positioning. The heart size remains enlarged. Diffuse coarse bilateral airspace opacities are again noted. There are small bilateral pleural effusions. No pneumothorax. The lung volumes are low. Creatinine = 1.45. BP = 116/85. CVP = 21. Heart failure giving more Lasix, increasing the Lasix IV infusion and have ordered a COOX. Doubt that aspiration is the etiology of hypoxia. It would appear the hypoxia is related to pulmonary congestion and low lung volumes on PCV. ABG = 7.31/73.3/258.0.   eICU Interventions  Will order: 1. Increase PEEP to 8 cm H2O.     Intervention Category Major Interventions: Hypoxemia - evaluation and management  Sommer,Steven Eugene 03/05/2019, 11:22 PM

## 2019-03-05 NOTE — Progress Notes (Addendum)
Patient ID: Judith [name], female   DOB: Jul 22, 1950, 68 y.o.   MRN: [id number]     Advanced Heart Failure Rounding Note  PCP-Cardiologist: Fransico Him, MD   Subjective:    Patient went back into atrial fibrillation with RVR 10/20, amiodarone increased to 60 mg/hr. HR 110s today.  She failed DCCV 10/22. Had repeat DC-CV on 10/23 which was successful but converted back to AF. Had DC-CV again 10/25 but went back in A fib.   Awake on the vent.   WBC trending up. MRSA on sputum culture.   Yesterday lasix drip stopped BUN trending up. I/Os very positive.    Objective:   Weight Range: 86.6 kg Body mass index is 32.77 kg/m.   Vital Signs:   Temp:  [98.1 F (36.7 C)-99.8 F (37.7 C)] 99.5 F (37.5 C) (10/26 0400) Pulse Rate:  [98-126] 126 (10/26 0500) Resp:  [16-23] 16 (10/26 0500) BP: (91-134)/(51-104) 116/83 (10/26 0500) SpO2:  [93 %-100 %] 97 % (10/26 0500) FiO2 (%):  [40 %] 40 % (10/26 0429) Weight:  [86.6 kg] 86.6 kg (10/26 0448) Last BM Date: 02/21/19  Weight change: Filed Weights   03/03/19 0500 03/04/19 0500 03/05/19 0448  Weight: 84.3 kg 85.3 kg 86.6 kg    Intake/Output:   Intake/Output Summary (Last 24 hours) at 03/05/2019 0706 Last data filed at 03/05/2019 0500 Gross per 24 hour  Intake 4221.81 ml  Output 2025 ml  Net 2196.81 ml      Physical Exam   CVP 18  General:  On vent  HEENT: normal Neck: supple. JVP to jaw.  Carotids 2+ bilat; no bruits. No lymphadenopathy or thryomegaly appreciated. Cor: PMI nondisplaced. Irregular rate & rhythm. No rubs, gallops or murmurs. Lungs: clear Abdomen: soft, nontender, nondistended. No hepatosplenomegaly. No bruits or masses. Good bowel sounds. Extremities: no cyanosis, clubbing, rash, LLE 2+ edema. R BKA  Neuro: Intubated   Telemetry    Afib RVR 90-110s  Labs    CBC Recent Labs    03/04/19 0308 03/05/19 0447  WBC 15.2* 17.8*  HGB 9.6* 9.6*  HCT 31.7* 30.8*  MCV 83.0 82.1  PLT 274 409   Basic  Metabolic Panel Recent Labs    03/04/19 0038 03/04/19 0308 03/05/19 0447  NA 132*  --  133*  K 4.2  --  3.6  CL 86*  --  88*  CO2 33*  --  33*  GLUCOSE 279*  --  216*  BUN 95*  --  92*  CREATININE 1.57*  --  1.45*  CALCIUM 8.7*  --  8.7*  MG  --  1.9 2.2  PHOS  --  3.1 2.6   Liver Function Tests Recent Labs    03/03/19 0351 03/04/19 0308  AST 66* 69*  ALT 140* 111*  ALKPHOS 127* 152*  BILITOT 0.8 0.7  PROT 6.0* 5.8*  ALBUMIN 2.0* 1.8*   No results for input(s): LIPASE, AMYLASE in the last 72 hours. Cardiac Enzymes No results for input(s): CKTOTAL, CKMB, CKMBINDEX, TROPONINI in the last 72 hours.  BNP: BNP (last 3 results) Recent Labs    02/08/2019 1459  BNP 1,119.0*    ProBNP (last 3 results) Recent Labs    10/13/18 1057 01/18/19 1445  PROBNP 2,908* 2,645*     D-Dimer No results for input(s): DDIMER in the last 72 hours. Hemoglobin A1C No results for input(s): HGBA1C in the last 72 hours. Fasting Lipid Panel No results for input(s): CHOL, HDL, LDLCALC, TRIG, CHOLHDL, LDLDIRECT in the  last 72 hours. Thyroid Function Tests No results for input(s): TSH, T4TOTAL, T3FREE, THYROIDAB in the last 72 hours.  Invalid input(s): FREET3  Other results:   Imaging    No results found.   Medications:     Scheduled Medications: . atorvastatin  20 mg Per Tube Daily  . budesonide (PULMICORT) nebulizer solution  0.25 mg Nebulization BID  . buPROPion  75 mg Per Tube BID  . chlorhexidine gluconate (MEDLINE KIT)  15 mL Mouth Rinse BID  . Chlorhexidine Gluconate Cloth  6 each Topical Daily  . famotidine  20 mg Per Tube Daily  . ferrous sulfate  300 mg Per Tube Daily  . FLUoxetine  20 mg Per Tube QHS  . insulin aspart  1 Units Subcutaneous Q4H  . insulin aspart  2-6 Units Subcutaneous Q4H  . insulin detemir  5 Units Subcutaneous Q12H  . loratadine  10 mg Per Tube QHS  . magnesium oxide  400 mg Per Tube QHS  . mouth rinse  15 mL Mouth Rinse 10 times per day   . methimazole  10 mg Per Tube BID  . sodium chloride flush  10-40 mL Intracatheter Q12H    Infusions: . sodium chloride 250 mL (03/04/19 1444)  . amiodarone 60 mg/hr (03/05/19 0620)  . ceFEPime (MAXIPIME) IV Stopped (03/04/19 2152)  . dextrose    . feeding supplement (VITAL HIGH PROTEIN) 1,000 mL (03/03/19 0856)  . heparin 1,550 Units/hr (03/05/19 0500)  . insulin Stopped (03/04/19 1616)  . norepinephrine (LEVOPHED) Adult infusion Stopped (02/27/19 1321)  . [START ON 03/06/2019] vancomycin      PRN Medications: sodium chloride, albuterol, bisacodyl, dextrose, docusate, fentaNYL (SUBLIMAZE) injection, Melatonin, midazolam, nitroGLYCERIN, ondansetron (ZOFRAN) IV, polyethylene glycol, sodium chloride flush, triamcinolone   Assessment/Plan   1. Acute on chronic systolic CHF/cardiogenic shock: Echo this admission with new fall in EF, 30-35% with diffuse hypokinesis worse in the inferoseptal wall, severe LAE. Possible tachy-mediated CMP as she was in rapid atypical atrial flutter rate 150s at admission.  She converted to NSR but converted back in atrial fibrillation on 10/22. Repeat DC-CV on 10/23 and 10/25 but went back in A Fib.  Remains in A Fib 110-120  CO-OX 69%.  - CVP 18   Creatinine stable ~ 1.45 . BUN 92 . Restart lasix drip at 12 mg per hour + 2.5 mg metolazone x1.  - Would ideally have workup for coronary disease as cause of cardiomyopathy (has known prior CAD), but would hold off until creatinine comes down and stabilizes.  2. Atypical atrial flutter/atrial fibrillation: Patient has had both atrial fibrillation and atypical flutter.  Admitted with atypical flutter/RVR in 150s.  She had emergent DCCV initially and remained in NSR for several days but back in persistent afib 10/20.  She has severe LAE and moderate RAE. She has been seen by Dr. Rayann Heman.  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 She is not a good ablation candidate given the size of the atria though could consider if she holds NSR for a month or two.  Rate is still not controlled. Failed DCCV on 10/23 but was still on dobutamine. Repeat DC-CV on 10/23 converted to NSR but flipped back to AF on 10/24. Remains in AF with RVR 110-120. Considered Ranexa but unable to give because can't crush down tube.  - Continue amiodarone gtt at 60 mg/hr - Unsuccessful cardioversion 10/25 .  -  Eventual option will be AV nodal ablation/BiV pacing if we cannot cardiovert her and she stabilizes/extubates.   - Continue heparin gtt (stopped apixaban).  No bleeding. Discussed dosing with PharmD personally. 3. Liver failure: Markedly elevated transaminases.  Suspect shock liver from hypotension/shock.  LFTs now trending down.  - Maintain MAP and CO, should improve over time.  4. CAD: Known CTO RCA, prior DES to LCx/OM in 2016.  Peak hs-TnI 548.  Most likely demand ischemia from shock.  - Back on statin - As above, would consider coronary angiography once creatinine comes down.  5. Hyperthyroidism: Thyrotoxicosis likely helps drive the atrial fibrillation/flutter.  This may have been triggered by chronic amiodarone use.  Unfortunately, she does not have a lot of options right now and will need to continue amiodarone.  - She is now off steroids.   - Continue methimazole.  6. AKI: On CKD stage 3.  Suspect due to shock. Creatinine stable at 1.45.  BUN 92  - Watch closely with diuresis  7. Aortic stenosis: Probably moderate.  8. Acute hypoxemic respiratory failure/HCAP: Multifactorial. Sputum cx 10/22 with MRSA and citerobacter. On vanc/cefepime for VAP. She is a prior smoker.  Self-extubated 10/23 and re-intubated. Afebrile, PCT 0.45=>0.49. => 0.21. - Continue vanc/cefipime - Management per CCM.  9. UTI - on abx 10. Hypokalemia - supp   Length of Stay: [place], NP  03/05/2019, 7:06 AM  Advanced  Heart Failure Team Pager [phone] (M-F; [place])  Please contact [facility] for night-coverage after hours (4p -7a ) and weekends on [email or web address]  Patient seen with NP, agree with the above note.    She is awake on vent this morning.  Attempted DCCV again yesterday but failed.  CVP 18 this morning, off diuretics and I/Os positive.  MRSA and citrobacter in sputum.  Co-ox 69%.  Creatinine mildly lower.   General: Awake on vent Neck: JVP 14 cm, no thyromegaly or thyroid nodule.  Lungs: Decreased at bases.  CV: Nondisplaced PMI.  Heart mildly tachy, irregular S1/S2, no S3/S4, no murmur.  Trace ankle edema.   Abdomen: Soft, nontender, no hepatosplenomegaly, no distention.  Skin: Intact without lesions or rashes.  Neurologic: Not following commands.  Extremities: No clubbing or cyanosis.  HEENT: Normal.   MRSA/citrobacter in sputum, on vancomycin and cefepime. WBCs higher 17.8.   CVP up to 18 off Lasix, BUN/creatinine lower today at 92/1.45.  Co-ox 69% off dobutamine.  - Restart Lasix 12 mg/hr.   - metolazone 2.5 x 1.   She remains in atrial fibrillation with rate 110s, failed DCCV again yesterday.   - When she can be stabilized and extubated, will need AV nodal ablation/BiV pacing ideally.  - Continue amiodarone for rate control.  - Continue heparin gtt.   CRITICAL CARE Performed by: Loralie Champagne  Total critical care time: 40 minutes  Critical care time was exclusive of separately billable procedures and treating other patients.  Critical care was necessary to treat or prevent imminent or life-threatening deterioration.  Critical care was time spent personally by me on the following activities: development of treatment plan with patient and/or surrogate as well as nursing, discussions with consultants, evaluation of patient's response to treatment, examination of patient, obtaining history from patient or surrogate, ordering and performing treatments and interventions, ordering  and review of laboratory studies, ordering and review of radiographic studies, pulse oximetry and re-evaluation of patient's condition.  Loralie Champagne 03/05/2019 7:36 AM

## 2019-03-05 NOTE — Progress Notes (Signed)
[place] for Heparin  Indication: atrial fibrillation  Allergies  Allergen Reactions  . Contrast Media [Iodinated Diagnostic Agents] Hives and Other (See Comments)    Spoke to patient, Iodine allergy is really IV contrast allergy.   . Novolin R [Insulin] Shortness Of Breath    Porcine insulin, Regular (per pt's daughter)  . Codeine Nausea And Vomiting and Other (See Comments)    HIGH DOSES-SEVERE VOMITING  . Iodine Other (See Comments)    MUST HAVE BENADRYL PRIOR TO PROCEDURE AND RIGHT BEFORE TREATMENT TO COUNTERACT REACTION-BLISTERING REACTION DERMATOLOGICAL  . Penicillins Itching, Rash and Other (See Comments)    Has patient had a PCN reaction causing immediate rash, facial/tongue/throat swelling, SOB or lightheadedness with hypotension: no Has patient had a PCN reaction causing severe rash involving mucus membranes or skin necrosis: No Has patient had a PCN reaction that required hospitalization No Has patient had a PCN reaction occurring within the last 10 years: No If all of the above answers are "NO", then may proceed with Cephalosporin use.  CHEST SIZED RASH AND ITCHING   . Ace Inhibitors Cough  . Demerol [Meperidine] Nausea And Vomiting  . Dilaudid [Hydromorphone Hcl] Other (See Comments)    HEADACHE   . Neosporin [Neomycin-Bacitracin Zn-Polymyx] Itching, Rash and Other (See Comments)    MAKES REACTIONS WORSE WHEN USING AS PROPHYLACTIC  . Percocet [Oxycodone-Acetaminophen] Rash  . Tape Itching and Rash    Patient Measurements: Height: 5\' 4"  (162.6 cm) Weight: 190 lb 14.7 oz (86.6 kg) IBW/kg (Calculated) : 54.7 Heparin Dosing Weight: 75kg  Vital Signs: Temp: 99.5 F (37.5 C) (10/26 0400) Temp Source: Axillary (10/26 0400) BP: 116/83 (10/26 0500) Pulse Rate: 126 (10/26 0500)  Labs: Recent Labs    03/03/19 0351 03/03/19 1211 03/04/19 0038 03/04/19 0308 03/05/19 0447 03/05/19 0448  HGB 9.4*  --   --  9.6* 9.6*  --    HCT 30.8*  --   --  31.7* 30.8*  --   PLT 280  --   --  274 286  --   APTT 53* 65*  --  86* 68*  --   HEPARINUNFRC 0.91*  --   --  0.80*  --  0.64  CREATININE 1.69*  --  1.57*  --  1.45*  --     Estimated Creatinine Clearance: 40.7 mL/min (A) (by C-G formula based on SCr of 1.45 mg/dL (H)).  Assessment: 68yof admitted for SOB in setting of Afib RVR was supposed to have DCCV but deferred because patient missed doses of apixaban prior. She developed hypotension, cardiogenic shock. Previously on apixaban last dose 10/17 pm.  aPTT= 68s this morning, heparin level still slightly elevated compared to aPTT d/t recent apixaban use but close to correlating at 0.64, hgb stable in 9s, plt normal. No bleeding issues noted.   Failed repeat dccv 10/23 and 10/25, back in afib.   Goal of Therapy:  Heparin level 0.3-0.7 aPTT aptt 66-90 sec seconds Monitor platelets by anticoagulation protocol: Yes   Plan:  -Continue heparin to 1550 units/hr -aPTT, heparin level and CBC in am -may be able to dose based on heparin levels alone tomorrow  Vertis Kelch, PharmD PGY2 Cardiology Pharmacy Resident Phone [phone] 03/05/2019       7:23 AM  Please check [email or web address] for unit-specific pharmacist phone numbers

## 2019-03-05 NOTE — Progress Notes (Signed)
PHARMACY - PHYSICIAN COMMUNICATION CRITICAL VALUE ALERT - BLOOD CULTURE IDENTIFICATION (BCID)  Judith [name] is an 68 y.o. female who presented to [facility] on 02/24/2019 with a chief complaint of chest pain SOB, also noted to have MRSA and citrobacter isolated from trach aspirate.  Assessment:  1/4 in aerobic bottle, GPC awaiting speciation  Name of physician (or Provider) Contacted: Dr. Vaughan Browner  Current antibiotics: Vancomycin and cefepime   Changes to prescribed antibiotics recommended:  Continue Vancomycin and cefepime   No results found for this or any previous visit.  [name] 03/05/2019  8:58 AM

## 2019-03-05 NOTE — Progress Notes (Signed)
Nutrition Follow up  DOCUMENTATION CODES:   Not applicable  INTERVENTION:   Monitor for BM- last documented 10/14  Continue tube feeding:  Vital High Protein @ 65 ml/hr (1560 ml) via OGT  Provides: 1560 kcals, 137 grams protein, 1304 ml free water.   NUTRITION DIAGNOSIS:   Inadequate oral intake related to inability to eat as evidenced by NPO status.  Ongoing  GOAL:   Patient will meet greater than or equal to 90% of their needs   Addressed via TF  MONITOR:   Diet advancement, Vent status, Skin, TF tolerance, Weight trends, Labs, I & O's  REASON FOR ASSESSMENT:   Consult Enteral/tube feeding initiation and management  ASSESSMENT:   Patient with PMH significant for CHF, COPD, DM, and CKD III. Presents this admission with afib w/ RVR.   10/17- cardioversion, hypotension, intubated 10/22- self extubated, re-intubated  10/25- DC cardioversion, failed    Pt discussed during ICU rounds and with RN.   Likely will need AV nodal ablation, BiV pacing. Continues on pressors. Lasix restarted. Levemir increased. Tolerating TF at goal rate. No BM in 14 days. Recommend adding bowel regimen.   Admission weight: 86.2 kg  Current weight: 86.6  kg   Patient remains intubated on ventilator support MV: 8.5 L/min Temp (24hrs), Avg:99.3 F (37.4 C), Min:98.1 F (36.7 C), Max:99.8 F (37.7 C)   I/O: +1,493 ml since admit UOP: 2,075 ml  x 24 hrs   Drips: amiodarone, 250 mg lasix in D5 @ 12 ml/hr Medications: ferrous sulfate, SS novolog, levemir, Mag-ox, 40 mEq KCl BID Labs: Na 133 (L) CBG 151-223  NUTRITION - FOCUSED PHYSICAL EXAM:    Most Recent Value  Orbital Region  No depletion  Upper Arm Region  No depletion  Thoracic and Lumbar Region  Unable to assess  Buccal Region  No depletion  Temple Region  Mild depletion  Clavicle Bone Region  Mild depletion  Clavicle and Acromion Bone Region  No depletion  Scapular Bone Region  Unable to assess  Dorsal Hand  No  depletion  Patellar Region  No depletion  Anterior Thigh Region  No depletion  Posterior Calf Region  No depletion  Edema (RD Assessment)  Mild  Hair  Reviewed  Eyes  Unable to assess  Mouth  Unable to assess  Skin  Reviewed  Nails  Reviewed     Diet Order:   Diet Order            Diet NPO time specified  Diet effective now              EDUCATION NEEDS:   Not appropriate for education at this time  Skin:  Skin Assessment: Skin Integrity Issues: Skin Integrity Issues:: Other (Comment) Other: MASD-abdomen/perineum  Last BM:  10/14  Height:   Ht Readings from Last 1 Encounters:  02/22/2019 5\' 4"  (1.626 m)    Weight:   Wt Readings from Last 1 Encounters:  03/05/19 86.6 kg    Ideal Body Weight:  54.5 kg  BMI:  Body mass index is 32.77 kg/m.  Estimated Nutritional Needs:   Kcal:  1587 kcal  Protein:  115-130 grams  Fluid:  >/= 1.4 L/day  Mariana Single RD, LDN Clinical Nutrition Pager # - [phone]

## 2019-03-05 NOTE — Progress Notes (Signed)
NAME:  Judith [name], MRN:  [id number], DOB:  [birth year], LOS: 75 ADMISSION DATE:  02/19/2019, CONSULTATION DATE:  03/04/2019 REFERRING MD:  Dr. Rayann Heman, CHIEF COMPLAINT:  Respiratory Failure    Brief History   68 year old female presents to ED on 10/16 with chest pain and shortness of breath. Was scheduled for cardioversion on 10/12 but that was canceled since patient had missed her Eliquis dosage for 3 consecutive days prior to that. Was found to be in A.Fib RVR. CXR with Cardiomegaly with no infiltrate. Taken for Cardioversion on 10/17. Required intubated at this time due to respiratory distress presumed secondary to cardiogenic shock. PCCM asked to consult.   Stay complicated by ongoing cardiogenic shock. Self Extubated 10/22. However required immediate reintubation.   Past Medical History  Paroxysmal afib, CHF, COPD, Hyperthyroidism, IDDM, CKD stage III  Significant Hospital Events   10/17 > Presents to ED < Take for Cardioversion, Intubated  10/19 > Severe vent asynchrony overnight,  Vecuronium x1 on 2 consecutive nights, changed to pressure control 10/20 > titrating pressors and inotrope support.  Had been on bicarb gtt this was stopped.  10/21 > Hyperglycemic overnight; insulin gtt started. Off norepinephrine, Co-ox 73% on dobutamine. [place] a little better. Back in AF. Decreasing dobutamine to 3. Lasix 80mg   f/b gtt started. Failed SBT attempt.  10/22 > Self-Extubated, Required Re-intubation  10/23 > dobutamine off 10/25> off Lasix drip, DC cardioversion by cardiology.  Failed and went back into the atrial fibrillation.  Consults:  Cardiology EP PCCM Heart Failure   Procedures:  10/17 > A.Line >> 10/17 > RIJ CVC >> 10/17 > ETT >> 10/22  10/22 > ETT >>   Significant Diagnostic Tests:  10/18: ULTRASOUND ABDOMEN LIMITED RIGHT UPPER QUADRANT > Mild gallbladder wall thickening up to 7 mm, which could be related to incomplete distension. No other sonographic features for acute  cholecystitis.Previously seen cholelithiasis not visualized on today's exam. No biliary dilatation or other acute finding. 10/17 TTE: Left ventricular ejection fraction, by visual estimation, is 30 to 35%. The left ventricle has moderate to severely decreased function. Normal left ventricular size. There is mildly increased left ventricular hypertrophy.Wall motion anlysis was limited (parasternal and apical-4-chamber views only), but there is global LV hypokinesis with severe inferosptal hypokinesis.Left ventricular diastolic function could not be evaluated due to atrial fibrillation. Left atrial size was severely dilated. Right atrial size was moderately dilated. Moderate to severe mitral annular calcification. The mitral valve is degenerative. Moderate mitral valve regurgitation. Tricuspid valve regurgitation mild-moderate. Mild to moderate aortic valve sclerosis/calcification. Overall impression is of moderate aortic stenosis.The right ventricle has mildly reduced global systolic function.The right ventricular size is normal. No increase in right ventricular wall thickness. Compared to January 2020, left ventricular systolic function has deteriorated globally, but there also appears to be new inferoseptal severe hypokinesis, disproportionate to the remainder of the LV. Mitral regurgitation has worsened and aortic valve  gradients are lower due to poor cardiac output.  Micro Data:  COVID Negative  HIV Negative MRSA Negative  Sputum culture 10/22-MRSA, Citrobacter Blood culture 10/25-GPC  Antimicrobials:  Vancomycin Cefepime  Interim history/subjective:    Objective   Blood pressure 115/76, pulse (!) 118, temperature 99.8 F (37.7 C), temperature source Oral, resp. rate 20, height 5\' 4"  (1.626 m), weight 86.6 kg, SpO2 95 %.    Vent Mode: PRVC FiO2 (%):  [40 %] 40 % Set Rate:  [18 bmp] 18 bmp PEEP:  [5 cmH20] 5 cmH20 Pressure Support:  [  [place] Pressure:  [12 cmH20-25  cmH20] 25 cmH20   Intake/Output Summary (Last 24 hours) at 03/05/2019 [id number] Last data filed at 03/05/2019 0900 Gross per 24 hour  Intake 4491.7 ml  Output 1925 ml  Net 2566.7 ml   Filed Weights   03/03/19 0500 03/04/19 0500 03/05/19 0448  Weight: 84.3 kg 85.3 kg 86.6 kg    Examination: Gen:      No acute distress HEENT:  EOMI, sclera anicteric Neck:     No masses; no thyromegaly, ETT Lungs:    Clear to auscultation bilaterally; normal respiratory effort CV:         Regular rate and rhythm; no murmurs Abd:      + bowel sounds; soft, non-tender; no palpable masses, no distension Ext:    No edema; adequate peripheral perfusion Skin:      Warm and dry; no rash Neuro: Sedated, unresponsive  Resolved Hospital Problem list     Assessment & Plan:  Sedation Needs in setting of Intubated State H/O Anxiety/Depression  Plan PRN fentanyl, versed, RASS goal 0 to -1 Continue Prozac, Wellbutrin    Cardiogenic Shock in setting of Acute on Chronic Diastolic Heart Failure -EF 30-35 A.Fib RVR Aortic Stenosis  H/O HLD, HTN Plan Failed cardioversion.  Will likely need AV nodal ablation, BiV pacing Continue amiodarone, heparin Restarted on Lasix drip.  CVP 10.  Continue diuresis Follow SCV O2  Acute Hypoxic Respiratory Distress in setting of Cardiogenic Shock and Pulmonary Edema  H/O Tobacco Abuse, no formal diagnosis for COPD Plan  PSV weans Follow intermittent chest x-ray Nebs  Acute on Chronic Renal Disease  Metabolic Acidosis >> Resolved  Plan Follow urine output, creatinine  Shock Liver, Transaminases in setting of Cardiogenic Shock  Plan  Trend LFT, Slowly Improving  Holding statin  VAP with MRSA, Citrobacter 1/4 blood culture with GPC Plan  Continue Vanco, cefepime Follow final cultures  Hyperglycemia  H/O DM Plan  SSI Increase Levemir to 10 units twice daily  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 Plan  Methimazole was resumed 10/19  Best practice:  Diet: TF Pain/Anxiety/Delirium protocol VAP protocol DVT prophylaxis: Heparin SQ GI prophylaxis: PPI Glucose control: Insulin  Mobility: Bedrest  Code Status: FC Family Communication: No family at bedside.  Will call Disposition: Continue ICU Care   Labs   CBC: Recent Labs  Lab 03/01/19 0358 03/02/19 0421 03/03/19 0351 03/04/19 0308 03/05/19 0447  WBC 13.0* 10.6* 11.0* 15.2* 17.8*  HGB 10.3* 9.4* 9.4* 9.6* 9.6*  HCT 33.0* 30.9* 30.8* 31.7* 30.8*  MCV 84.4 84.4 83.7 83.0 82.1  PLT 322 278 280 274 [id number]    Basic Metabolic Panel: Recent Labs  Lab 02/27/19 0353  03/01/19 0358 03/02/19 0421 03/03/19 0351 03/04/19 0038 03/04/19 0308 03/05/19 0447  NA 130*   < > 132* 130* 132* 132*  --  133*  K 4.0   < > 3.4* 3.7 4.1 4.2  --  3.6  CL 89*   < > 87* 85* 85* 86*  --  88*  CO2 27   < > 32 33* 32 33*  --  33*  GLUCOSE 264*   < > 261* 187* 147* 279*  --  216*  BUN 80*   < > 92* 86* 91* 95*  --  92*  CREATININE 2.65*   < > 1.87* 1.56* 1.69* 1.57*  --  1.45*  CALCIUM 8.5*   < >  9.1 8.8* 9.0 8.7*  --  8.7*  MG 2.3  --   --  1.8 2.3  --  1.9 2.2  PHOS  --   --   --  2.3* 4.1  --  3.1 2.6   < > = values in this interval not displayed.   GFR: Estimated Creatinine Clearance: 40.7 mL/min (A) (by C-G formula based on SCr of 1.45 mg/dL (H)). Recent Labs  Lab 03/01/19 0943 03/02/19 0421 03/03/19 0351 03/03/19 0400 03/04/19 0308 03/04/19 0909 03/05/19 0447  PROCALCITON 0.45 0.49 0.49  --   --  0.21  --   WBC  --  10.6* 11.0*  --  15.2*  --  17.8*  LATICACIDVEN  --  1.3  --  1.1  --   --   --     Liver Function Tests: Recent Labs  Lab 02/28/19 0455 03/01/19 0358 03/02/19 0421 03/03/19 0351 03/04/19 0308  AST 220* 105* 64* 66* 69*  ALT 556* 355* 201* 140* 111*  ALKPHOS 131* 138* 123 127* 152*  BILITOT 0.9 1.1 1.1 0.8 0.7  PROT 5.6* 6.2* 5.7* 6.0* 5.8*  ALBUMIN 2.4* 2.5* 2.1* 2.0* 1.8*   No  results for input(s): LIPASE, AMYLASE in the last 168 hours. No results for input(s): AMMONIA in the last 168 hours.  ABG    Component Value Date/Time   PHART 7.412 02/28/2019 1247   PCO2ART 49.9 (H) 02/28/2019 1247   PO2ART 65.0 (L) 02/28/2019 1247   HCO3 31.8 (H) 02/28/2019 1247   TCO2 33 (H) 02/28/2019 1247   ACIDBASEDEF 5.0 (H) 02/14/2019 1706   O2SAT 69.1 03/05/2019 0445     Coagulation Profile: Recent Labs  Lab 03/02/19 0421  INR 1.8*    Cardiac Enzymes: No results for input(s): CKTOTAL, CKMB, CKMBINDEX, TROPONINI in the last 168 hours.  HbA1C: Hemoglobin A1C  Date/Time Value Ref Range Status  02/13/2019 01:10 PM 7.3 (A) 4.0 - 5.6 % Final   Hgb A1c MFr Bld  Date/Time Value Ref Range Status  03/06/2019 07:54 PM 8.1 (H) 4.8 - 5.6 % Final    Comment:    (NOTE) Pre diabetes:          5.7%-6.4% Diabetes:              >6.4% Glycemic control for   <7.0% adults with diabetes   10/18/2018 11:35 AM 6.6 (H) 4.8 - 5.6 % Final    Comment:             Prediabetes: 5.7 - 6.4          Diabetes: >6.4          Glycemic control for adults with diabetes: <7.0     CBG: Recent Labs  Lab 03/04/19 2354 03/05/19 0059 03/05/19 0205 03/05/19 0356 03/05/19 0827  GLUCAP 159* 152* 151* 184* 223*     Critical care time: 35 mins   The patient is critically ill with multiple organ system failure and requires high complexity decision making for assessment and support, frequent evaluation and titration of therapies, advanced monitoring, review of radiographic studies and interpretation of complex data.   Critical Care Time devoted to patient care services, exclusive of separately billable procedures, described in this note is 35 minutes.   Marshell Garfinkel MD [facility] Pager [phone] If no answer call [phone] 03/05/2019, 9:04 AM

## 2019-03-06 ENCOUNTER — Inpatient Hospital Stay (HOSPITAL_COMMUNITY): Payer: Medicare PPO

## 2019-03-06 LAB — CBC
HCT: 33.3 % — ABNORMAL LOW (ref 36.0–46.0)
Hemoglobin: 9.9 g/dL — ABNORMAL LOW (ref 12.0–15.0)
MCH: 25.3 pg — ABNORMAL LOW (ref 26.0–34.0)
MCHC: 29.7 g/dL — ABNORMAL LOW (ref 30.0–36.0)
MCV: 84.9 fL (ref 80.0–100.0)
Platelets: 318 10*3/uL (ref 150–400)
RBC: 3.92 MIL/uL (ref 3.87–5.11)
RDW: 17.8 % — ABNORMAL HIGH (ref 11.5–15.5)
WBC: 22.2 10*3/uL — ABNORMAL HIGH (ref 4.0–10.5)
nRBC: 0 % (ref 0.0–0.2)

## 2019-03-06 LAB — GLUCOSE, CAPILLARY
Glucose-Capillary: 115 mg/dL — ABNORMAL HIGH (ref 70–99)
Glucose-Capillary: 114 mg/dL — ABNORMAL HIGH (ref 70–99)
Glucose-Capillary: 153 mg/dL — ABNORMAL HIGH (ref 70–99)
Glucose-Capillary: 183 mg/dL — ABNORMAL HIGH (ref 70–99)
Glucose-Capillary: 149 mg/dL — ABNORMAL HIGH (ref 70–99)

## 2019-03-06 LAB — CULTURE, BLOOD (ROUTINE X 2): Special Requests: ADEQUATE

## 2019-03-06 LAB — COOXEMETRY PANEL
Carboxyhemoglobin: 1.7 % — ABNORMAL HIGH (ref 0.5–1.5)
Carboxyhemoglobin: 1.9 % — ABNORMAL HIGH (ref 0.5–1.5)
Methemoglobin: 0.9 % (ref 0.0–1.5)
Methemoglobin: 1.1 % (ref 0.0–1.5)
O2 Saturation: 69.4 %
O2 Saturation: 71.8 %
Total hemoglobin: 10.3 g/dL — ABNORMAL LOW (ref 12.0–16.0)
Total hemoglobin: 14 g/dL (ref 12.0–16.0)

## 2019-03-06 LAB — BASIC METABOLIC PANEL
Anion gap: 16 — ABNORMAL HIGH (ref 5–15)
Anion gap: 17 — ABNORMAL HIGH (ref 5–15)
BUN: 110 mg/dL — ABNORMAL HIGH (ref 8–23)
BUN: 115 mg/dL — ABNORMAL HIGH (ref 8–23)
CO2: 30 mmol/L (ref 22–32)
CO2: 28 mmol/L (ref 22–32)
Calcium: 8.8 mg/dL — ABNORMAL LOW (ref 8.9–10.3)
Calcium: 8.6 mg/dL — ABNORMAL LOW (ref 8.9–10.3)
Chloride: 89 mmol/L — ABNORMAL LOW (ref 98–111)
Chloride: 88 mmol/L — ABNORMAL LOW (ref 98–111)
Creatinine, Ser: 2.21 mg/dL — ABNORMAL HIGH (ref 0.44–1.00)
Creatinine, Ser: 2.41 mg/dL — ABNORMAL HIGH (ref 0.44–1.00)
GFR calc Af Amer: 26 mL/min — ABNORMAL LOW (ref >60)
GFR calc Af Amer: 23 mL/min — ABNORMAL LOW (ref >60)
GFR calc non Af Amer: 22 mL/min — ABNORMAL LOW (ref >60)
GFR calc non Af Amer: 20 mL/min — ABNORMAL LOW (ref >60)
Glucose, Bld: 117 mg/dL — ABNORMAL HIGH (ref 70–99)
Glucose, Bld: 180 mg/dL — ABNORMAL HIGH (ref 70–99)
Potassium: 5.4 mmol/L — ABNORMAL HIGH (ref 3.5–5.1)
Potassium: 4.8 mmol/L (ref 3.5–5.1)
Sodium: 135 mmol/L (ref 135–145)
Sodium: 133 mmol/L — ABNORMAL LOW (ref 135–145)

## 2019-03-06 LAB — POCT I-STAT 7, (LYTES, BLD GAS, ICA,H+H)
Acid-Base Excess: 7 mmol/L — ABNORMAL HIGH (ref 0.0–2.0)
Bicarbonate: 34.2 mmol/L — ABNORMAL HIGH (ref 20.0–28.0)
Calcium, Ion: 1.16 mmol/L (ref 1.15–1.40)
HCT: 34 % — ABNORMAL LOW (ref 36.0–46.0)
Hemoglobin: 11.6 g/dL — ABNORMAL LOW (ref 12.0–15.0)
O2 Saturation: 95 %
Patient temperature: 98.5
Potassium: 5.2 mmol/L — ABNORMAL HIGH (ref 3.5–5.1)
Sodium: 132 mmol/L — ABNORMAL LOW (ref 135–145)
TCO2: 36 mmol/L — ABNORMAL HIGH (ref 22–32)
pCO2 arterial: 60.9 mmHg — ABNORMAL HIGH (ref 32.0–48.0)
pH, Arterial: 7.357 (ref 7.350–7.450)
pO2, Arterial: 80 mmHg — ABNORMAL LOW (ref 83.0–108.0)

## 2019-03-06 LAB — APTT: aPTT: 86 seconds — ABNORMAL HIGH (ref 24–36)

## 2019-03-06 LAB — PHOSPHORUS: Phosphorus: 5.1 mg/dL — ABNORMAL HIGH (ref 2.5–4.6)

## 2019-03-06 LAB — T4, FREE: Free T4: 1.74 ng/dL — ABNORMAL HIGH (ref 0.61–1.12)

## 2019-03-06 LAB — HEPARIN LEVEL (UNFRACTIONATED): Heparin Unfractionated: 0.86 IU/mL — ABNORMAL HIGH (ref 0.30–0.70)

## 2019-03-06 LAB — MAGNESIUM: Magnesium: 2.3 mg/dL (ref 1.7–2.4)

## 2019-03-06 LAB — TSH: TSH: 0.107 u[IU]/mL — ABNORMAL LOW (ref 0.350–4.500)

## 2019-03-06 MED ORDER — METOLAZONE 2.5 MG PO TABS: 2.5000 mg | ORAL_TABLET | Freq: Once | ORAL | Status: DC

## 2019-03-06 MED ORDER — SODIUM CHLORIDE 0.9 % IV SOLN
2.0000 g | INTRAVENOUS | Status: DC
  Administered 2019-03-07: 2 g via INTRAVENOUS
  Filled 2019-03-06: qty 2

## 2019-03-06 MED ORDER — DOBUTAMINE IN D5W 4-5 MG/ML-% IV SOLN
3.0000 ug/kg/min | INTRAVENOUS | Status: DC
  Administered 2019-03-06: 3 ug/kg/min via INTRAVENOUS
  Filled 2019-03-06: qty 250

## 2019-03-06 NOTE — Progress Notes (Signed)
[facility] for Heparin  Indication: atrial fibrillation  Allergies  Allergen Reactions  . Contrast Media [Iodinated Diagnostic Agents] Hives and Other (See Comments)    Spoke to patient, Iodine allergy is really IV contrast allergy.   . Novolin R [Insulin] Shortness Of Breath    Porcine insulin, Regular (per pt's daughter)  . Codeine Nausea And Vomiting and Other (See Comments)    HIGH DOSES-SEVERE VOMITING  . Iodine Other (See Comments)    MUST HAVE BENADRYL PRIOR TO PROCEDURE AND RIGHT BEFORE TREATMENT TO COUNTERACT REACTION-BLISTERING REACTION DERMATOLOGICAL  . Penicillins Itching, Rash and Other (See Comments)    Has patient had a PCN reaction causing immediate rash, facial/tongue/throat swelling, SOB or lightheadedness with hypotension: no Has patient had a PCN reaction causing severe rash involving mucus membranes or skin necrosis: No Has patient had a PCN reaction that required hospitalization No Has patient had a PCN reaction occurring within the last 10 years: No If all of the above answers are "NO", then may proceed with Cephalosporin use.  CHEST SIZED RASH AND ITCHING   . Ace Inhibitors Cough  . Demerol [Meperidine] Nausea And Vomiting  . Dilaudid [Hydromorphone Hcl] Other (See Comments)    HEADACHE   . Neosporin [Neomycin-Bacitracin Zn-Polymyx] Itching, Rash and Other (See Comments)    MAKES REACTIONS WORSE WHEN USING AS PROPHYLACTIC  . Percocet [Oxycodone-Acetaminophen] Rash  . Tape Itching and Rash    Patient Measurements: Height: 5\' 4"  (162.6 cm) Weight: 188 lb 11.4 oz (85.6 kg) IBW/kg (Calculated) : 54.7 Heparin Dosing Weight: 75kg  Vital Signs: Temp: 100.4 F (38 C) (10/27 0414) Temp Source: Oral (10/27 0414) BP: 128/71 (10/27 0700) Pulse Rate: 103 (10/27 0700)  Labs: Recent Labs    03/04/19 0038  03/04/19 0308 03/05/19 0447 03/05/19 0448 03/05/19 2257 03/06/19 0327 03/06/19 0405  HGB  --    < > 9.6* 9.6*   --  11.9* 9.9* 11.6*  HCT  --    < > 31.7* 30.8*  --  35.0* 33.3* 34.0*  PLT  --   --  274 286  --   --  318  --   APTT  --   --  86* 68*  --   --  86*  --   HEPARINUNFRC  --   --  0.80*  --  0.64  --  0.86*  --   CREATININE 1.57*  --   --  1.45*  --   --  2.21*  --    < > = values in this interval not displayed.    Estimated Creatinine Clearance: 26.5 mL/min (A) (by C-G formula based on SCr of 2.21 mg/dL (H)).  Assessment: 68yof admitted for SOB in setting of Afib RVR was supposed to have DCCV but deferred because patient missed doses of apixaban prior. She developed hypotension, cardiogenic shock. Previously on apixaban last dose 10/17 pm.  aPTT= 86s this morning, heparin level still slightly elevated compared to aPTT d/t recent apixaban use but close to correlating at 0.86, hgb stable in 9s, plt normal. No bleeding issues noted.   Failed repeat dccv 10/23 and 10/25, back in afib.   Goal of Therapy:  Heparin level 0.3-0.7 aPTT aptt 66-90 sec seconds Monitor platelets by anticoagulation protocol: Yes   Plan:  -Continue heparin at 1550 units/hr -aPTT, heparin level and CBC in am -May be able to dose based on heparin levels alone tomorrow  Vertis Kelch, PharmD PGY2 Cardiology Pharmacy Resident Phone [phone])  [id number] 03/06/2019       7:18 AM  Please check [email or web address] for unit-specific pharmacist phone numbers

## 2019-03-06 NOTE — Progress Notes (Addendum)
Patient ID: Judith [name], female   DOB: [birth year], 68 y.o.   MRN: [id number]     Advanced Heart Failure Rounding Note  PCP-Cardiologist: Fransico Him, MD   Subjective:    Patient went back into atrial fibrillation with RVR 10/20, amiodarone increased to 60 mg/hr. HR 110s today.  She failed DCCV 10/22. Had repeat DC-CV on 10/23 which was successful but converted back to AF. Had DC-CV again 10/25 but went back in A fib.   WBC continues to trend up.  MRSA on sputum culture.   Yesterday lasix drip + metolazone restarted. Worsening renal function. Over night some question about possible aspiration. CXR appears wet.  Tube feeds stopped.   Drowsy. Opens eyes.   Objective:   Weight Range: 85.6 kg Body mass index is 32.39 kg/m.   Vital Signs:   Temp:  [98.5 F (36.9 C)-100.4 F (38 C)] 100.4 F (38 C) (10/27 0414) Pulse Rate:  [25-123] 103 (10/27 0700) Resp:  [10-23] 14 (10/27 0700) BP: (101-156)/(70-96) 128/71 (10/27 0700) SpO2:  [87 %-99 %] 95 % (10/27 0700) FiO2 (%):  [40 %-100 %] 50 % (10/27 0322) Weight:  [85.6 kg] 85.6 kg (10/27 0500) Last BM Date: 02/21/19  Weight change: Filed Weights   03/04/19 0500 03/05/19 0448 03/06/19 0500  Weight: 85.3 kg 86.6 kg 85.6 kg    Intake/Output:   Intake/Output Summary (Last 24 hours) at 03/06/2019 0710 Last data filed at 03/06/2019 0600 Gross per 24 hour  Intake 2609.58 ml  Output 1759 ml  Net 850.58 ml      Physical Exam   CVP 13-14  General:  On vent.  No resp difficulty HEENT: ETT Neck: supple. JVP 13-14 . Carotids 2+ bilat; no bruits. No lymphadenopathy or thryomegaly appreciated. Cor: PMI nondisplaced. Irregular rate & rhythm. No rubs, gallops or murmurs. Lungs: Rhonchi Abdomen: soft, nontender, nondistended. No hepatosplenomegaly. No bruits or masses. Good bowel sounds. Extremities: no cyanosis, clubbing, rash, LLE/RBKA  1+  edema Neuro:opens eyes. Does not follow commands.    Telemetry  A fib 90-100s   Labs     CBC Recent Labs    03/05/19 0447  03/06/19 0327 03/06/19 0405  WBC 17.8*  --  22.2*  --   HGB 9.6*   < > 9.9* 11.6*  HCT 30.8*   < > 33.3* 34.0*  MCV 82.1  --  84.9  --   PLT 286  --  318  --    < > = values in this interval not displayed.   Basic Metabolic Panel Recent Labs    03/05/19 0447  03/06/19 0327 03/06/19 0405  NA 133*   < > 135 132*  K 3.6   < > 5.4* 5.2*  CL 88*  --  89*  --   CO2 33*  --  30  --   GLUCOSE 216*  --  117*  --   BUN 92*  --  110*  --   CREATININE 1.45*  --  2.21*  --   CALCIUM 8.7*  --  8.8*  --   MG 2.2  --  2.3  --   PHOS 2.6  --  5.1*  --    < > = values in this interval not displayed.   Liver Function Tests Recent Labs    03/04/19 0308  AST 69*  ALT 111*  ALKPHOS 152*  BILITOT 0.7  PROT 5.8*  ALBUMIN 1.8*   No results for input(s): LIPASE, AMYLASE in the last 72  hours. Cardiac Enzymes No results for input(s): CKTOTAL, CKMB, CKMBINDEX, TROPONINI in the last 72 hours.  BNP: BNP (last 3 results) Recent Labs    02/20/2019 1459  BNP 1,119.0*    ProBNP (last 3 results) Recent Labs    10/13/18 1057 01/18/19 1445  PROBNP 2,908* 2,645*     D-Dimer No results for input(s): DDIMER in the last 72 hours. Hemoglobin A1C No results for input(s): HGBA1C in the last 72 hours. Fasting Lipid Panel No results for input(s): CHOL, HDL, LDLCALC, TRIG, CHOLHDL, LDLDIRECT in the last 72 hours. Thyroid Function Tests No results for input(s): TSH, T4TOTAL, T3FREE, THYROIDAB in the last 72 hours.  Invalid input(s): FREET3  Other results:   Imaging    Dg Chest Port 1 View  Result Date: 03/05/2019 CLINICAL DATA:  Aspiration. EXAM: PORTABLE CHEST 1 VIEW COMPARISON:  03/05/2019 FINDINGS: The endotracheal tube terminates above the carina. The right-sided central venous catheter is stable in positioning. The heart size remains enlarged. Diffuse coarse bilateral airspace opacities are again noted. There are small bilateral pleural  effusions. No pneumothorax. The lung volumes are low. IMPRESSION: No significant interval change. Electronically Signed   By: Constance Holster M.D.   On: 03/05/2019 22:01     Medications:     Scheduled Medications: . atorvastatin  20 mg Per Tube Daily  . budesonide (PULMICORT) nebulizer solution  0.25 mg Nebulization BID  . buPROPion  75 mg Per Tube BID  . chlorhexidine gluconate (MEDLINE KIT)  15 mL Mouth Rinse BID  . Chlorhexidine Gluconate Cloth  6 each Topical Daily  . famotidine  20 mg Per Tube Daily  . ferrous sulfate  300 mg Per Tube Daily  . FLUoxetine  20 mg Per Tube QHS  . insulin aspart  1 Units Subcutaneous Q4H  . insulin aspart  2-6 Units Subcutaneous Q4H  . insulin detemir  10 Units Subcutaneous Q12H  . loratadine  10 mg Per Tube QHS  . magnesium oxide  400 mg Per Tube QHS  . mouth rinse  15 mL Mouth Rinse 10 times per day  . methimazole  10 mg Per Tube BID  . potassium chloride  40 mEq Per Tube BID  . sodium chloride flush  10-40 mL Intracatheter Q12H    Infusions: . sodium chloride 250 mL (03/04/19 1444)  . amiodarone 60 mg/hr (03/06/19 0646)  . ceFEPime (MAXIPIME) IV Stopped (03/05/19 2317)  . dextrose    . feeding supplement (VITAL HIGH PROTEIN) Stopped (03/05/19 2200)  . fentaNYL infusion INTRAVENOUS 150 mcg/hr (03/06/19 0600)  . furosemide (LASIX) infusion 15 mg/hr (03/06/19 0600)  . heparin 1,550 Units/hr (03/06/19 0600)  . insulin Stopped (03/04/19 1616)  . norepinephrine (LEVOPHED) Adult infusion Stopped (02/27/19 1321)  . vancomycin      PRN Medications: sodium chloride, albuterol, bisacodyl, dextrose, docusate, fentaNYL (SUBLIMAZE) injection, Melatonin, midazolam, nitroGLYCERIN, ondansetron (ZOFRAN) IV, polyethylene glycol, sodium chloride flush, triamcinolone   Assessment/Plan   1. Acute on chronic systolic CHF/cardiogenic shock: Echo this admission with new fall in EF, 30-35% with diffuse hypokinesis worse in the inferoseptal wall, severe  LAE. Possible tachy-mediated CMP as she was in rapid atypical atrial flutter rate 150s at admission.  She converted to NSR but converted back in atrial fibrillation on 10/22. Repeat DC-CV on 10/23 and 10/25 but went back in A Fib.  Remains in A fiib. CO-OX 72%. Add dobutamine 3 mcg.     Creatinine up 1.45>2.2 BUN 92>110 - Continue lasix drip and give dose of metolazone.  -  Would ideally have workup for coronary disease as cause of cardiomyopathy (has known prior CAD), but would hold off until creatinine comes down and stabilizes.  2. Atypical atrial flutter/atrial fibrillation: Patient has had both atrial fibrillation and atypical flutter.  Admitted with atypical flutter/RVR in 150s.  She had emergent DCCV initially and remained in NSR for several days but back in persistent afib 10/20.  She has severe LAE and moderate RAE. She has been seen by Dr. Rayann Heman.  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 She is not a good ablation candidate given the size of the atria though could consider if she holds NSR for a month or two.  Rate is still not controlled. Failed DCCV on 10/23 but was still on dobutamine. Repeat DC-CV on 10/23 converted to NSR but flipped back to AF on 10/24. Remains in AF with RVR 110-120. Considered Ranexa but unable to give because can't crush down tube.  - Continue amiodarone gtt at 60 mg/hr - Unsuccessful cardioversion 10/25 .  - Eventual option will be AV nodal ablation/BiV pacing if we cannot cardiovert her and she stabilizes/extubates.   - Continue heparin gtt (stopped apixaban).  No bleeding. Discussed dosing with PharmD personally. 3. Liver failure: Markedly elevated transaminases.  Suspect shock liver from hypotension/shock.  LFTs now trending down.  - Maintain MAP and CO, should improve over time.  4. CAD: Known CTO RCA, prior DES to LCx/OM in 2016.  Peak hs-TnI  548.  Most likely demand ischemia from shock.  - Back on statin - As above, would consider coronary angiography once creatinine comes down.  5. Hyperthyroidism: Thyrotoxicosis likely helps drive the atrial fibrillation/flutter.  This may have been triggered by chronic amiodarone use.  Unfortunately, she does not have a lot of options right now and will need to continue amiodarone.  - She is now off steroids.   - Continue methimazole.  6. AKI: On CKD stage 3.  Suspect due to shock. Creatinine/BUN  trending up.  7. Aortic stenosis: Probably moderate.  8. Acute hypoxemic respiratory failure/HCAP: Multifactorial. Sputum cx 10/22 with MRSA and citerobacter. On vanc/cefepime for VAP. She is a prior smoker.  Self-extubated 10/23 and re-intubated. Afebrile, PCT 0.45=>0.49. => 0.21. - Continue vanc/cefipime - Management per CCM.  9. UTI - on abx 10. Hypokalemia - Resolved.  11. Hyperkalemia Stop potassium supplements.   Spoke with sister at bedside. Will try dobutamine to help facilitate diuresis. Continue lasix drip + metolazone. Check BMEt this afternoon.   Per Dr Aundra Dubin if no improvement with addition of dobutamine will likely need to to change to comfort care. Consult palliative care.   Length of Stay: [place], NP  03/06/2019, 7:10 AM  Advanced Heart Failure Team Pager [phone] (M-F; [place])  Please contact [facility] for night-coverage after hours (4p -7a ) and weekends on [email or web address]  Patient seen with NP, agree with the above note.   Mrs Monfort had a difficult day, oxygen saturations dropped overnight and FiO2 increased to 100%.  Poor diuresis on Lasix gtt + milrinone with CVP 14 and BUN/creatinine up.  CXR with pulmonary edema. Co-ox 72%.    General: sedated on vent.  Neck: JVP 12+, no thyromegaly or thyroid nodule.  Lungs: Decreased BS at bases, coarse BS CV: Nondisplaced PMI.  Heart mildly tachy, irregular S1/S2, no S3/S4, no murmur.  1+ ankle edema on left.  Abdomen: Soft, nontender, no hepatosplenomegaly, no distention.  Skin: Intact without lesions or rashes.  Neurologic: Sedated.  Extremities: s/p right BKA.  HEENT: Normal.   VAP, MRSA/citrobacter in sputum, on vancomycin and cefepime. WBCs higher 22 with Tm 100.4. Also concern for aspiration overnight and tube feeds stopped.   CVP 14 with pulmonary edema on CXR and increased FiO2 requirement, BUN/creatinine higher 110/2.21.  Co-ox 76%% off dobutamine. She diuresed poorly.  Though co-ox ok this morning, diuresis was significantly better when she was on dobutamine.  - Will try her back on dobutamine at 3, follow HR closely.  - Continue Lasix 15 mg/hr given pulmonary edema and worsened oxygenation.    - metolazone 2.5 x 1.   She remains in atrial fibrillation with rate 110s, failed 3rd DCCV on 10/25.    - If she can be stabilized and extubated, will need AV nodal ablation/BiV pacing ideally.  - Continue amiodarone for rate control.  - Continue heparin gtt.   Discussed with sister this morning.  She would not want prolonged life support and we are not making progress with worsening renal function and oxygenation.  Will try to diurese one more day with adding back dobutamine, but if she does not make progress today, will need to discuss comfort measures approach.  Will have palliative care come by to talk with her family today.   CRITICAL CARE Performed by: Loralie Champagne  Total critical care time: 45 minutes  Critical care time was exclusive of separately billable procedures and treating other patients.  Critical care was necessary to treat or prevent imminent or life-threatening deterioration.  Critical care was time spent personally by me on the following activities: development of treatment plan with patient and/or surrogate as well as nursing, discussions with consultants, evaluation of patient's response to treatment, examination of patient, obtaining history from patient or  surrogate, ordering and performing treatments and interventions, ordering and review of laboratory studies, ordering and review of radiographic studies, pulse oximetry and re-evaluation of patient's condition.  Loralie Champagne 03/06/2019 7:55 AM

## 2019-03-06 NOTE — Progress Notes (Addendum)
NAME:  Judith [name], MRN:  [id number], DOB:  [birth year], LOS: 54 ADMISSION DATE:  02/09/2019, CONSULTATION DATE:  03/04/2019 REFERRING MD:  Dr. Rayann Heman, CHIEF COMPLAINT:  Respiratory Failure    Brief History   68 year old female presents to ED on 10/16 with chest pain and shortness of breath. Was scheduled for cardioversion on 10/12 but that was canceled since patient had missed her Eliquis dosage for 3 consecutive days prior to that. Was found to be in A.Fib RVR. CXR with Cardiomegaly with no infiltrate. Taken for Cardioversion on 10/17. Required intubated at this time due to respiratory distress presumed secondary to cardiogenic shock. PCCM asked to consult.   Stay complicated by ongoing cardiogenic shock. Self Extubated 10/22. However required immediate reintubation.   Past Medical History  Paroxysmal afib, CHF, COPD, Hyperthyroidism, IDDM, CKD stage III  Significant Hospital Events   10/17 > Presents to ED < Take for Cardioversion, Intubated  10/19 > Severe vent asynchrony overnight,  Vecuronium x1 on 2 consecutive nights, changed to pressure control 10/20 > titrating pressors and inotrope support.  Had been on bicarb gtt this was stopped.  10/21 > Hyperglycemic overnight; insulin gtt started. Off norepinephrine, Co-ox 73% on dobutamine. [place] a little better. Back in AF. Decreasing dobutamine to 3. Lasix 80mg   f/b gtt started. Failed SBT attempt.  10/22 > Self-Extubated, Required Re-intubation  10/23 > dobutamine off 10/25> DC cardioversion by cardiology.  Failed and went back into the atrial fibrillation. 10/27> continues on Lasix drip.  Worsening urine output.  Dobutamine restarted by cardiology.  Not tolerating pressure support weans  Consults:  Cardiology EP PCCM Heart Failure   Procedures:  10/17 > A.Line >> 10/17 > RIJ CVC >> 10/17 > ETT >> 10/22  10/22 > ETT >>   Significant Diagnostic Tests:  10/18: ULTRASOUND ABDOMEN LIMITED RIGHT UPPER QUADRANT > Mild gallbladder wall  thickening up to 7 mm, which could be related to incomplete distension. No other sonographic features for acute cholecystitis.Previously seen cholelithiasis not visualized on today's exam. No biliary dilatation or other acute finding. 10/17 TTE: Left ventricular ejection fraction, by visual estimation, is 30 to 35%. The left ventricle has moderate to severely decreased function. Normal left ventricular size. There is mildly increased left ventricular hypertrophy.Wall motion anlysis was limited (parasternal and apical-4-chamber views only), but there is global LV hypokinesis with severe inferosptal hypokinesis.Left ventricular diastolic function could not be evaluated due to atrial fibrillation. Left atrial size was severely dilated. Right atrial size was moderately dilated. Moderate to severe mitral annular calcification. The mitral valve is degenerative. Moderate mitral valve regurgitation. Tricuspid valve regurgitation mild-moderate. Mild to moderate aortic valve sclerosis/calcification. Overall impression is of moderate aortic stenosis.The right ventricle has mildly reduced global systolic function.The right ventricular size is normal. No increase in right ventricular wall thickness. Compared to January 2020, left ventricular systolic function has deteriorated globally, but there also appears to be new inferoseptal severe hypokinesis, disproportionate to the remainder of the LV. Mitral regurgitation has worsened and aortic valve  gradients are lower due to poor cardiac output.  Micro Data:  COVID Negative  HIV Negative MRSA Negative  Sputum culture 10/22-MRSA, Citrobacter Blood culture 10/25-1/2 GPC  Antimicrobials:  Vancomycin 10/25 >  Cefepime 10/25 >   Interim history/subjective:  Remains on Lasix with low urine output.  Started on dobutamine On ventilator, unresponsive Emesis. Concern for aspiration. NG to suction.   Objective   Blood pressure 128/71, pulse (!) 103, temperature (!) 100.4  F (38  C), temperature source Oral, resp. rate 14, height 5\' 4"  (1.626 m), weight 85.6 kg, SpO2 95 %. CVP:  [13 mmHg-21 mmHg] 18 mmHg  Vent Mode: PCV FiO2 (%):  [40 %-100 %] 50 % Set Rate:  [18 bmp] 18 bmp PEEP:  [5 cmH20-8 cmH20] 8 cmH20 Pressure Support:  [10 cmH20] 10 cmH20 Plateau Pressure:  [12 Y026551 cmH20] 12 cmH20   Intake/Output Summary (Last 24 hours) at 03/06/2019 0843 Last data filed at 03/06/2019 0700 Gross per 24 hour  Intake 2495.7 ml  Output 1649 ml  Net 846.7 ml   Filed Weights   03/04/19 0500 03/05/19 0448 03/06/19 0500  Weight: 85.3 kg 86.6 kg 85.6 kg    Examination: Gen:      No acute distress HEENT:  EOMI, sclera anicteric Neck:     No masses; no thyromegaly, ET tube Lungs:    Clear to auscultation bilaterally; normal respiratory effort CV:         Regular rate and rhythm; no murmurs Abd:      + bowel sounds; soft, non-tender; no palpable masses, no distension Ext:    No edema; adequate peripheral perfusion Skin:      Warm and dry; no rash Neuro: Sedated, unresponsive  Resolved Hospital Problem list     Assessment & Plan:  Sedation Needs in setting of Intubated State H/O Anxiety/Depression  Plan PRN fentanyl, versed, RASS goal 0 to -1 Continue Prozac, Wellbutrin    Cardiogenic Shock in setting of Acute on Chronic Diastolic Heart Failure -EF 30-35 A.Fib RVR Aortic Stenosis  H/O HLD, HTN Plan Failed cardioversion.  Will likely need AV nodal ablation, BiV pacing Continue amiodarone, heparin, lasix drip Restarted on dobutamine drip.    Acute Hypoxic Respiratory Distress in setting of Cardiogenic Shock and Pulmonary Edema  H/O Tobacco Abuse, no formal diagnosis for COPD Plan  PSV weans as tolerated Follow intermittent chest x-ray Nebs  Acute on Chronic Renal Disease  Hyperkalemia Metabolic Acidosis >> Resolved  Plan Follow urine output, creatinine Will dose lokelma  Shock Liver, Transaminases in setting of Cardiogenic Shock  Plan   Trend LFT, Slowly Improving  Holding statin  VAP with MRSA, Citrobacter 1/4 blood culture with GPC Plan  Continue Vanco, cefepime Follow final cultures  Hyperglycemia  H/O DM Plan  SSI Increased Levemir to 10 units twice daily om 10/27  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 Plan  Methimazole was resumed 10/19 Recheck TSH and free t4, t3  Emesis Check Abd x ray for ileus Holding tube feeds.   Best practice:  Diet: Holding TF Pain/Anxiety/Delirium protocol VAP protocol DVT prophylaxis: Heparin SQ GI prophylaxis: PPI Glucose control: Insulin  Mobility: Bedrest  Code Status: FC Family Communication: Daughter updated 10/27 Disposition: Continue ICU Care   Labs   CBC: Recent Labs  Lab 03/02/19 0421 03/03/19 0351 03/04/19 0308 03/05/19 0447 03/05/19 2257 03/06/19 0327 03/06/19 0405  WBC 10.6* 11.0* 15.2* 17.8*  --  22.2*  --   HGB 9.4* 9.4* 9.6* 9.6* 11.9* 9.9* 11.6*  HCT 30.9* 30.8* 31.7* 30.8* 35.0* 33.3* 34.0*  MCV 84.4 83.7 83.0 82.1  --  84.9  --   PLT 278 280 274 286  --  318  --     Basic Metabolic Panel: Recent Labs  Lab 03/02/19 0421 03/03/19 0351 03/04/19 0038 03/04/19 0308 03/05/19 0447 03/05/19 2257 03/06/19 0327 03/06/19 0405  NA 130* 132* 132*  --  133* 132* 135 132*  K 3.7 4.1 4.2  --  3.6 5.2* 5.4* 5.2*  CL 85* 85* 86*  --  88*  --  89*  --   CO2 33* 32 33*  --  33*  --  30  --   GLUCOSE 187* 147* 279*  --  216*  --  117*  --   BUN 86* 91* 95*  --  92*  --  110*  --   CREATININE 1.56* 1.69* 1.57*  --  1.45*  --  2.21*  --   CALCIUM 8.8* 9.0 8.7*  --  8.7*  --  8.8*  --   MG 1.8 2.3  --  1.9 2.2  --  2.3  --   PHOS 2.3* 4.1  --  3.1 2.6  --  5.1*  --    GFR: Estimated Creatinine Clearance: 26.5 mL/min (A) (by C-G formula based on SCr of 2.21 mg/dL (H)). Recent Labs  Lab 03/01/19 0943 03/02/19 0421 03/03/19 0351 03/03/19 0400 03/04/19 0308 03/04/19 0909  03/05/19 0447 03/06/19 0327  PROCALCITON 0.45 0.49 0.49  --   --  0.21  --   --   WBC  --  10.6* 11.0*  --  15.2*  --  17.8* 22.2*  LATICACIDVEN  --  1.3  --  1.1  --   --   --   --     Liver Function Tests: Recent Labs  Lab 02/28/19 0455 03/01/19 0358 03/02/19 0421 03/03/19 0351 03/04/19 0308  AST 220* 105* 64* 66* 69*  ALT 556* 355* 201* 140* 111*  ALKPHOS 131* 138* 123 127* 152*  BILITOT 0.9 1.1 1.1 0.8 0.7  PROT 5.6* 6.2* 5.7* 6.0* 5.8*  ALBUMIN 2.4* 2.5* 2.1* 2.0* 1.8*   No results for input(s): LIPASE, AMYLASE in the last 168 hours. No results for input(s): AMMONIA in the last 168 hours.  ABG    Component Value Date/Time   PHART 7.357 03/06/2019 0405   PCO2ART 60.9 (H) 03/06/2019 0405   PO2ART 80.0 (L) 03/06/2019 0405   HCO3 34.2 (H) 03/06/2019 0405   TCO2 36 (H) 03/06/2019 0405   ACIDBASEDEF 5.0 (H) 02/11/2019 1706   O2SAT 95.0 03/06/2019 0405     Coagulation Profile: Recent Labs  Lab 03/02/19 0421  INR 1.8*    Cardiac Enzymes: No results for input(s): CKTOTAL, CKMB, CKMBINDEX, TROPONINI in the last 168 hours.  HbA1C: Hemoglobin A1C  Date/Time Value Ref Range Status  02/13/2019 01:10 PM 7.3 (A) 4.0 - 5.6 % Final   Hgb A1c MFr Bld  Date/Time Value Ref Range Status  03/06/2019 07:54 PM 8.1 (H) 4.8 - 5.6 % Final    Comment:    (NOTE) Pre diabetes:          5.7%-6.4% Diabetes:              >6.4% Glycemic control for   <7.0% adults with diabetes   10/18/2018 11:35 AM 6.6 (H) 4.8 - 5.6 % Final    Comment:             Prediabetes: 5.7 - 6.4          Diabetes: >6.4          Glycemic control for adults with diabetes: <7.0     CBG: Recent Labs  Lab 03/05/19 1611 03/05/19 2036 03/05/19 2356 03/06/19 0411 03/06/19 0836  GLUCAP 155* 144* 119* 115* 114*     Critical care time: 35 mins   The patient is critically ill with multiple organ  system failure and requires high complexity decision making for assessment and support, frequent  evaluation and titration of therapies, advanced monitoring, review of radiographic studies and interpretation of complex data.   Critical Care Time devoted to patient care services, exclusive of separately billable procedures, described in this note is 35 minutes.   Marshell Garfinkel MD [facility] Pager [phone] If no answer call [phone] 03/06/2019, 8:43 AM

## 2019-03-07 ENCOUNTER — Inpatient Hospital Stay (HOSPITAL_COMMUNITY): Payer: Medicare PPO

## 2019-03-07 LAB — COMPREHENSIVE METABOLIC PANEL
ALT: 105 U/L — ABNORMAL HIGH (ref 0–44)
AST: 131 U/L — ABNORMAL HIGH (ref 15–41)
Albumin: 1.8 g/dL — ABNORMAL LOW (ref 3.5–5.0)
Alkaline Phosphatase: 140 U/L — ABNORMAL HIGH (ref 38–126)
Anion gap: 14 (ref 5–15)
BUN: 124 mg/dL — ABNORMAL HIGH (ref 8–23)
CO2: 29 mmol/L (ref 22–32)
Calcium: 8.7 mg/dL — ABNORMAL LOW (ref 8.9–10.3)
Chloride: 89 mmol/L — ABNORMAL LOW (ref 98–111)
Creatinine, Ser: 2.77 mg/dL — ABNORMAL HIGH (ref 0.44–1.00)
GFR calc Af Amer: 20 mL/min — ABNORMAL LOW (ref >60)
GFR calc non Af Amer: 17 mL/min — ABNORMAL LOW (ref >60)
Glucose, Bld: 120 mg/dL — ABNORMAL HIGH (ref 70–99)
Potassium: 4.4 mmol/L (ref 3.5–5.1)
Sodium: 132 mmol/L — ABNORMAL LOW (ref 135–145)
Total Bilirubin: 1 mg/dL (ref 0.3–1.2)
Total Protein: 6 g/dL — ABNORMAL LOW (ref 6.5–8.1)

## 2019-03-07 LAB — CBC
HCT: 31.2 % — ABNORMAL LOW (ref 36.0–46.0)
Hemoglobin: 9.4 g/dL — ABNORMAL LOW (ref 12.0–15.0)
MCH: 25.1 pg — ABNORMAL LOW (ref 26.0–34.0)
MCHC: 30.1 g/dL (ref 30.0–36.0)
MCV: 83.4 fL (ref 80.0–100.0)
Platelets: 329 10*3/uL (ref 150–400)
RBC: 3.74 MIL/uL — ABNORMAL LOW (ref 3.87–5.11)
RDW: 17.8 % — ABNORMAL HIGH (ref 11.5–15.5)
WBC: 18.1 10*3/uL — ABNORMAL HIGH (ref 4.0–10.5)
nRBC: 0 % (ref 0.0–0.2)

## 2019-03-07 LAB — APTT
aPTT: 137 seconds — ABNORMAL HIGH (ref 24–36)
aPTT: 120 seconds — ABNORMAL HIGH (ref 24–36)

## 2019-03-07 LAB — GLUCOSE, CAPILLARY
Glucose-Capillary: 138 mg/dL — ABNORMAL HIGH (ref 70–99)
Glucose-Capillary: 112 mg/dL — ABNORMAL HIGH (ref 70–99)
Glucose-Capillary: 101 mg/dL — ABNORMAL HIGH (ref 70–99)
Glucose-Capillary: 99 mg/dL (ref 70–99)

## 2019-03-07 LAB — PHOSPHORUS: Phosphorus: 6.5 mg/dL — ABNORMAL HIGH (ref 2.5–4.6)

## 2019-03-07 LAB — COOXEMETRY PANEL
Carboxyhemoglobin: 1.7 % — ABNORMAL HIGH (ref 0.5–1.5)
Methemoglobin: 1 % (ref 0.0–1.5)
O2 Saturation: 78.4 %
Total hemoglobin: 15.4 g/dL (ref 12.0–16.0)

## 2019-03-07 LAB — T3, FREE: T3, Free: 1.6 pg/mL — ABNORMAL LOW (ref 2.0–4.4)

## 2019-03-07 LAB — MAGNESIUM: Magnesium: 2.3 mg/dL (ref 1.7–2.4)

## 2019-03-07 LAB — HEPARIN LEVEL (UNFRACTIONATED): Heparin Unfractionated: 0.77 IU/mL — ABNORMAL HIGH (ref 0.30–0.70)

## 2019-03-07 MED ORDER — INSULIN DETEMIR 100 UNIT/ML ~~LOC~~ SOLN
5.0000 [IU] | Freq: Two times a day (BID) | SUBCUTANEOUS | Status: DC
  Administered 2019-03-07: 10:00:00 5 [IU] via SUBCUTANEOUS
  Filled 2019-03-07 (×2): qty 0.05

## 2019-03-07 MED ORDER — PROPOFOL 1000 MG/100ML IV EMUL
10.0000 ug/kg/min | INTRAVENOUS | Status: DC
  Administered 2019-03-07: 10 ug/kg/min via INTRAVENOUS

## 2019-03-07 MED ORDER — GLYCOPYRROLATE 0.2 MG/ML IJ SOLN: 0.3000 mg | INTRAMUSCULAR | Status: DC | PRN

## 2019-03-08 LAB — CULTURE, RESPIRATORY W GRAM STAIN

## 2019-03-09 LAB — CULTURE, BLOOD (ROUTINE X 2)
Culture: NO GROWTH
Special Requests: ADEQUATE

## 2019-03-11 NOTE — Progress Notes (Signed)
Patient ID: Judith [name], female   DOB: 1950/08/25, 68 y.o.   MRN: [id number]     Advanced Heart Failure Rounding Note  PCP-Cardiologist: Fransico Him, MD   Subjective:    Patient went back into atrial fibrillation with RVR 10/20, amiodarone increased to 60 mg/hr. HR 110s today.  She failed DCCV 10/22. Had repeat DC-CV on 10/23 which was successful but converted back to AF. Had DC-CV again 10/25 but went back in A fib.  She remains in atrial fibrillation HR 110s-120s this morning on amiodarone 60 mg/hr.   WBCs 18, afebrile.  MRSA and citrobacter on sputum culture, she is on vancomycin/cefepime.   She was restarted on dobutamine gtt at 3 yesterday as well as Lasix drip + metolazone.  Renal function worsening, poor UOP (925 cc).  CVP 19-20, co-ox 78%.   Looks more comfortable this morning, will follow commands.   Objective:   Weight Range: 87.1 kg Body mass index is 32.96 kg/m.   Vital Signs:   Temp:  [97.1 F (36.2 C)-99 F (37.2 C)] 98.3 F (36.8 C) (10/28 0424) Pulse Rate:  [98-122] 122 (10/28 0700) Resp:  [11-20] 16 (10/28 0700) BP: (95-130)/(50-88) 124/88 (10/28 0700) SpO2:  [93 %-100 %] 99 % (10/28 0807) FiO2 (%):  [40 %] 40 % (10/28 0800) Weight:  [87.1 kg] 87.1 kg (10/28 0500) Last BM Date: 02/21/19  Weight change: Filed Weights   03/05/19 0448 03/06/19 0500 03-17-19 0500  Weight: 86.6 kg 85.6 kg 87.1 kg    Intake/Output:   Intake/Output Summary (Last 24 hours) at March 17, 2019 0810 Last data filed at March 17, 2019 0600 Gross per 24 hour  Intake 2615.77 ml  Output 1425 ml  Net 1190.77 ml      Physical Exam   CVP 19-20 General: NAD Neck: JVP 16+, no thyromegaly or thyroid nodule.  Lungs: Crackles at bases.  CV: Nondisplaced PMI.  Heart mildly tachy, irregular S1/S2, no S3/S4, 2/6 SEM RUSB.  1+ edema to knee on left.   Abdomen: Soft, nontender, no hepatosplenomegaly, no distention.  Skin: Intact without lesions or rashes.  Neurologic: Sedated but will  awaken.   Extremities: No clubbing or cyanosis. Right BKA.  HEENT: Normal.    Telemetry   A fib 110s, personally reviewed.    Labs    CBC Recent Labs    03/06/19 0327 03/06/19 0405 03/17/2019 0215  WBC 22.2*  --  18.1*  HGB 9.9* 11.6* 9.4*  HCT 33.3* 34.0* 31.2*  MCV 84.9  --  83.4  PLT 318  --  448   Basic Metabolic Panel Recent Labs    03/06/19 0327  03/06/19 1335 2019/03/17 0215  NA 135   < > 133* 132*  K 5.4*   < > 4.8 4.4  CL 89*  --  88* 89*  CO2 30  --  28 29  GLUCOSE 117*  --  180* 120*  BUN 110*  --  115* 124*  CREATININE 2.21*  --  2.41* 2.77*  CALCIUM 8.8*  --  8.6* 8.7*  MG 2.3  --   --  2.3  PHOS 5.1*  --   --  6.5*   < > = values in this interval not displayed.   Liver Function Tests Recent Labs    03/17/2019 0215  AST 131*  ALT 105*  ALKPHOS 140*  BILITOT 1.0  PROT 6.0*  ALBUMIN 1.8*   No results for input(s): LIPASE, AMYLASE in the last 72 hours. Cardiac Enzymes No results for input(s):  CKTOTAL, CKMB, CKMBINDEX, TROPONINI in the last 72 hours.  BNP: BNP (last 3 results) Recent Labs    03/09/2019 1459  BNP 1,119.0*    ProBNP (last 3 results) Recent Labs    10/13/18 1057 01/18/19 1445  PROBNP 2,908* 2,645*     D-Dimer No results for input(s): DDIMER in the last 72 hours. Hemoglobin A1C No results for input(s): HGBA1C in the last 72 hours. Fasting Lipid Panel No results for input(s): CHOL, HDL, LDLCALC, TRIG, CHOLHDL, LDLDIRECT in the last 72 hours. Thyroid Function Tests Recent Labs    03/06/19 1335  TSH 0.107*  T3FREE 1.6*    Other results:   Imaging    Dg Abd 1 View  Result Date: 03/06/2019 CLINICAL DATA:  Vomiting. EXAM: ABDOMEN - 1 VIEW COMPARISON:  March 01, 2019. FINDINGS: The bowel gas pattern is normal. Distal tip of nasogastric tube is seen in expected position of distal stomach. No radio-opaque calculi or other significant radiographic abnormality are seen. IMPRESSION: Distal tip of nasogastric tube seen  in expected position of distal stomach. No evidence of bowel obstruction or ileus. Electronically Signed   By: Marijo Conception M.D.   On: 03/06/2019 11:47   Dg Chest Port 1 View  Result Date: 2019/03/17 CLINICAL DATA:  Respiratory failure. EXAM: PORTABLE CHEST 1 VIEW COMPARISON:  March 06, 2019. FINDINGS: Stable cardiomediastinal silhouette. Endotracheal and nasogastric tubes are unchanged in position. Right internal jugular catheter is unchanged. No pneumothorax or significant pleural effusion is noted. Mild bibasilar subsegmental atelectasis is noted. Bony thorax is unremarkable. IMPRESSION: Stable support apparatus. Stable bibasilar subsegmental atelectasis. Electronically Signed   By: Marijo Conception M.D.   On: 2019-03-17 07:29     Medications:     Scheduled Medications: . atorvastatin  20 mg Per Tube Daily  . budesonide (PULMICORT) nebulizer solution  0.25 mg Nebulization BID  . buPROPion  75 mg Per Tube BID  . chlorhexidine gluconate (MEDLINE KIT)  15 mL Mouth Rinse BID  . Chlorhexidine Gluconate Cloth  6 each Topical Daily  . famotidine  20 mg Per Tube Daily  . ferrous sulfate  300 mg Per Tube Daily  . FLUoxetine  20 mg Per Tube QHS  . insulin aspart  1 Units Subcutaneous Q4H  . insulin aspart  2-6 Units Subcutaneous Q4H  . insulin detemir  10 Units Subcutaneous Q12H  . loratadine  10 mg Per Tube QHS  . magnesium oxide  400 mg Per Tube QHS  . mouth rinse  15 mL Mouth Rinse 10 times per day  . methimazole  10 mg Per Tube BID  . metolazone  2.5 mg Per Tube Once  . sodium chloride flush  10-40 mL Intracatheter Q12H    Infusions: . sodium chloride 250 mL (03/04/19 1444)  . amiodarone 60 mg/hr (03-17-2019 6004)  . ceFEPime (MAXIPIME) IV    . dextrose Stopped (03/06/19 1812)  . DOBUTamine 3 mcg/kg/min (03-17-19 0600)  . feeding supplement (VITAL HIGH PROTEIN) Stopped (03/05/19 2200)  . fentaNYL infusion INTRAVENOUS 200 mcg/hr (2019-03-17 0600)  . furosemide (LASIX) infusion 15  mg/hr (03/17/2019 0600)  . heparin 1,550 Units/hr (03-17-19 0600)  . insulin Stopped (03/04/19 1616)  . norepinephrine (LEVOPHED) Adult infusion Stopped (02/27/19 1321)  . vancomycin Stopped (03/06/19 1014)    PRN Medications: sodium chloride, albuterol, bisacodyl, dextrose, docusate, fentaNYL (SUBLIMAZE) injection, Melatonin, midazolam, nitroGLYCERIN, ondansetron (ZOFRAN) IV, polyethylene glycol, sodium chloride flush, triamcinolone   Assessment/Plan   1. Acute on chronic systolic CHF/cardiogenic shock: Echo  this admission with new fall in EF, 30-35% with diffuse hypokinesis worse in the inferoseptal wall, severe LAE. Possible tachy-mediated CMP as she was in rapid atypical atrial flutter rate 150s at admission.  She converted to NSR but converted back in atrial fibrillation on 10/22. Repeat DC-CV on 10/23 and 10/25 but went back in A Fib. Remains in atrial fibrillation with mild RVR. Co-ox 78% on dobutamine 3. Creatinine continues to rise. CVP up to 19-20.  - For now, will continue current dobutamine and Lasix gtt.  However, diuresing poorly with rising CVP.  If we were to push forward aggressively, would have to have CVVH at this point.  - Would ideally have workup for coronary disease as cause of cardiomyopathy (has known prior CAD), but not with AKI.  2. Atypical atrial flutter/atrial fibrillation: Patient has had both atrial fibrillation and atypical flutter.  Admitted with atypical flutter/RVR in 150s.  She had emergent DCCV initially and remained in NSR for several days but back in persistent afib 10/20.  She has severe LAE and moderate RAE. She has been seen by Dr. Rayann Heman.  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 She is not a good ablation candidate given the size of the atria though could consider if she holds NSR for a month or two.  Rate is still not controlled. Failed  DCCV on 10/23 but was still on dobutamine. Repeat DC-CV on 10/23 converted to NSR but flipped back to AF on 10/24, unsuccessful DCCV again on 10/25. Remains in AF with RVR 110s-120s. Considered Ranexa but unable to give because can't crush down tube.  - Continue amiodarone gtt at 60 mg/hr - Eventual option would be AV nodal ablation/BiV pacing if we cannot cardiovert her and she stabilizes/extubates.   - Continue heparin gtt (stopped apixaban).  No bleeding.  3. Liver failure: Markedly elevated transaminases.  Suspect shock liver from hypotension/shock.  LFTs have trended down.  - Maintain MAP and CO.   4. CAD: Known CTO RCA, prior DES to LCx/OM in 2016.  Peak hs-TnI 548.  Most likely demand ischemia from shock.  - Back on statin - As above, no angiography with AKI.  5. Hyperthyroidism: Thyrotoxicosis likely helps drive the atrial fibrillation/flutter.  This may have been triggered by chronic amiodarone use.  Unfortunately, she does not have a lot of options right now and will need to continue amiodarone.  - She is now off steroids.   - Continue methimazole.  6. AKI: On CKD stage 3.  Suspect due to shock. Creatinine/BUN continue to trend up with poor UOP and volume overload.  As above, if we were to push forwards aggressively, would need CVVH at this point.  7. Aortic stenosis: Probably moderate.  8. Acute hypoxemic respiratory failure/HCAP: Multifactorial, suspect VAP and pulmonary edema. Sputum cx 10/22 with MRSA and citrobacter. On vanc/cefepime for VAP. She is a prior smoker.  Self-extubated 10/23 and re-intubated. Afebrile, PCT 0.45=>0.49. => 0.21. - Continue vanc/cefipime for VAP - Diuresis as above.  - Management per CCM.  9. UTI: on abx  Spoke with sister at bedside. At this point, will need to make decision on further aggressive care (would require CVVH).  However, I think that prognosis is poor with ongoing aggressive management.  I spoke with family this morning about comfort care,  they will be meeting with palliative care service this morning.   CRITICAL CARE Performed by: Loralie Champagne  Total critical care time: 40 minutes  Critical care time was exclusive of separately billable procedures and treating other patients.  Critical care was necessary to treat or prevent imminent or life-threatening deterioration.  Critical care was time spent personally by me on the following activities: development of treatment plan with patient and/or surrogate as well as nursing, discussions with consultants, evaluation of patient's response to treatment, examination of patient, obtaining history from patient or surrogate, ordering and performing treatments and interventions, ordering and review of laboratory studies, ordering and review of radiographic studies, pulse oximetry and re-evaluation of patient's condition.   Length of Stay: 12  Loralie Champagne, MD  2019-03-19, 8:10 AM  Advanced Heart Failure Team Pager [phone] (M-F; [facility])  Please contact [facility] for night-coverage after hours (4p -7a ) and weekends on [email or web address]

## 2019-03-11 NOTE — Consult Note (Addendum)
Palliative Care Consult Note  Judith [name] is a 68yo woman with long standing chronic disease with CHF, COPD and CKD who was admitted in cardiogenic shock and subsequently was intubated and required pressors-she is currently on full vent support and requiring multiple pressors.. Her overall prognosis was very poor at baseline and family report that she had poor QOL prior to admission and had always expressed a desire to not have her life prolonged especially if she could not maintain her independence or would require HD or to be dependent on machines to live.  I met with her sister, her son, her daughter and niece. All are in agreement and requesting that she be removed from the ventilator as soon as possible to relive her suffering. They consented to terminal wean and I explained the process and what to expect. They were present at the bedside for removal of the tube and for the duration of the patients life following tube removal.  Given the patient's awareness level and concern for difficult to manage secretions I initiated a low dose Propofol infusion with the intent of providing comfort and sedation throughout the dying process. Her pressors were discontinued and all other medications not directly related to her comfort. I provided support at the bedside during the the compassionate wean process along with the patient's RN. Time of death was 1224 confirmed by final EKG and Asculation by RN.  Family expressed gratitude for the care given to their loved one during her hospital stay and for ensuring a peaceful death given the burdens and irreversible nature of her medical problems.  Lane Hacker, DO Palliative Medicine [phone]  70 minutes Greater than 50%  of this time was spent counseling and coordinating care related to the above assessment and plan.

## 2019-03-11 NOTE — Progress Notes (Signed)
Palliative Care meeting scheduled for 11AM today with patient's family.  Lane Hacker, DO Palliative Medicine [phone]

## 2019-03-11 NOTE — Progress Notes (Signed)
RT note- Patient was extubated to comfort care at this time.

## 2019-03-11 NOTE — Progress Notes (Signed)
Nutrition Brief Note  Chart reviewed. Pt now transitioning to comfort care.  No further nutrition interventions warranted at this time.  Please re-consult as needed.   Devaun Hernandez RD, LDN Clinical Nutrition Pager # - [phone]    

## 2019-03-11 NOTE — Progress Notes (Signed)
NAME:  Judith [name], MRN:  [id number], DOB:  19-Dec-1950, LOS: 72 ADMISSION DATE:  03/03/2019, CONSULTATION DATE:  03/04/2019 REFERRING MD:  Dr. Rayann Heman, CHIEF COMPLAINT:  Respiratory Failure    Brief History   68 year old female presents to ED on 10/16 with chest pain and shortness of breath. Was scheduled for cardioversion on 10/12 but that was canceled since patient had missed her Eliquis dosage for 3 consecutive days prior to that. Was found to be in A.Fib RVR. CXR with Cardiomegaly with no infiltrate. Taken for Cardioversion on 10/17. Required intubated at this time due to respiratory distress presumed secondary to cardiogenic shock. PCCM asked to consult.   Stay complicated by ongoing cardiogenic shock. Self Extubated 10/22. However required immediate reintubation.   Past Medical History  Paroxysmal afib, CHF, COPD, Hyperthyroidism, IDDM, CKD stage III  Significant Hospital Events   10/17 > Presents to ED < Take for Cardioversion, Intubated  10/19 > Severe vent asynchrony overnight,  Vecuronium x1 on 2 consecutive nights, changed to pressure control 10/20 > titrating pressors and inotrope support.  Had been on bicarb gtt this was stopped.  10/21 > Hyperglycemic overnight; insulin gtt started. Off norepinephrine, Co-ox 73% on dobutamine. [place] a little better. Back in AF. Decreasing dobutamine to 3. Lasix 80mg   f/b gtt started. Failed SBT attempt.  10/22 > Self-Extubated, Required Re-intubation  10/23 > dobutamine off 10/25> DC cardioversion by cardiology.  Failed and went back into the atrial fibrillation. 10/27> continues on Lasix drip.  Worsening urine output.  Dobutamine restarted by cardiology.  Not tolerating pressure support weans. Palliative consulted  Consults:  Cardiology EP PCCM Heart Failure   Procedures:  10/17 > A.Line >> 10/17 > RIJ CVC >> 10/17 > ETT >> 10/22  10/22 > ETT >>   Significant Diagnostic Tests:  10/18: ULTRASOUND ABDOMEN LIMITED RIGHT UPPER QUADRANT >  Mild gallbladder wall thickening up to 7 mm, which could be related to incomplete distension. No other sonographic features for acute cholecystitis.Previously seen cholelithiasis not visualized on today's exam. No biliary dilatation or other acute finding. 10/17 TTE: Left ventricular ejection fraction, by visual estimation, is 30 to 35%. The left ventricle has moderate to severely decreased function. Normal left ventricular size. There is mildly increased left ventricular hypertrophy.Wall motion anlysis was limited (parasternal and apical-4-chamber views only), but there is global LV hypokinesis with severe inferosptal hypokinesis.Left ventricular diastolic function could not be evaluated due to atrial fibrillation. Left atrial size was severely dilated. Right atrial size was moderately dilated. Moderate to severe mitral annular calcification. The mitral valve is degenerative. Moderate mitral valve regurgitation. Tricuspid valve regurgitation mild-moderate. Mild to moderate aortic valve sclerosis/calcification. Overall impression is of moderate aortic stenosis.The right ventricle has mildly reduced global systolic function.The right ventricular size is normal. No increase in right ventricular wall thickness. Compared to January 2020, left ventricular systolic function has deteriorated globally, but there also appears to be new inferoseptal severe hypokinesis, disproportionate to the remainder of the LV. Mitral regurgitation has worsened and aortic valve  gradients are lower due to poor cardiac output.  Micro Data:  COVID Negative  HIV Negative MRSA Negative  Sputum culture 10/22-MRSA, Citrobacter Blood culture 10/25-1/2 GPC  Antimicrobials:  Vancomycin 10/25 >  Cefepime 10/25 >   Interim history/subjective:  Continues lasix and dobutamine with poor urine output CVP is higher Palliative care meeting planned for later today  Objective   Blood pressure (!) 129/106, pulse (!) 120, temperature 98.3  F (36.8 C), temperature  source Oral, resp. rate 13, height 5\' 4"  (1.626 m), weight 87.1 kg, SpO2 99 %. CVP:  [10 mmHg-17 mmHg] 13 mmHg  Vent Mode: PCV FiO2 (%):  [40 %] 40 % Set Rate:  [18 bmp] 18 bmp PEEP:  [8 cmH20] [place] Pressure:  [17 cmH20-22 cmH20] 22 cmH20   Intake/Output Summary (Last 24 hours) at 2019/04/04 [id number] Last data filed at Apr 04, 2019 0800 Gross per 24 hour  Intake 2790.14 ml  Output 1460 ml  Net 1330.14 ml   Filed Weights   03/05/19 0448 03/06/19 0500 Apr 04, 2019 0500  Weight: 86.6 kg 85.6 kg 87.1 kg    Examination: Gen:      No acute distress HEENT:  EOMI, sclera anicteric Neck:     No masses; no thyromegaly, ETT Lungs:    Clear to auscultation bilaterally; normal respiratory effort CV:         Regular rate and rhythm; no murmurs Abd:      + bowel sounds; soft, non-tender; no palpable masses, no distension Ext:    No edema; adequate peripheral perfusion, RT BKA Skin:      Warm and dry; no rash Neuro: Sedated, unresponsive  Resolved Hospital Problem list     Assessment & Plan:  Sedation Needs in setting of Intubated State H/O Anxiety/Depression  Plan PRN fentanyl, versed, RASS goal 0 to -1 Continue Prozac, Wellbutrin    Cardiogenic Shock in setting of Acute on Chronic Diastolic Heart Failure -EF 30-35 A.Fib RVR Aortic Stenosis  H/O HLD, HTN Plan Failed cardioversion.  Will likely need AV nodal ablation, BiV pacing when more stable Continue amiodarone, heparin, lasix drip Restarted on dobutamine drip 10/27  Acute Hypoxic Respiratory Distress in setting of Cardiogenic Shock and Pulmonary Edema  H/O Tobacco Abuse, no formal diagnosis for COPD Plan  Continue full vent support.  Has not tolerated PSV weans Follow intermittent chest x-ray Nebs  Acute on Chronic Renal Disease  Hyperkalemia Metabolic Acidosis >> Resolved  Plan Follow urine output, creatinine May need CVVH pending palliative care meeting today.   Shock Liver,  Transaminases in setting of Cardiogenic Shock  Plan  Trend LFT, Slowly Improving  Holding statin  VAP with MRSA, Citrobacter 1/4 blood culture with GPC Plan  Continue Vanco, cefepime Follow final cultures  Hyperglycemia  H/O DM Plan  SSI Continue levemir. Will reduce dose to 5 units bid as BS is trending lower.   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 Plan  Methimazole was resumed 10/19 Follow TSH  Emesis No evidence of ileus on abd x ray.  Resume tube feeds at trickle  Best practice:  Diet: TFs Pain/Anxiety/Delirium protocol: Fentanyl drip, versed PRN VAP protocol: yes DVT prophylaxis: Heparin  drip GI prophylaxis: PPI Glucose control: SSI, levemir Mobility: Bedrest  Code Status: FC Family Communication: Daughter updated 10/27 Disposition: Continue ICU Care   Labs   CBC: Recent Labs  Lab 03/03/19 0351 03/04/19 0308 03/05/19 0447 03/05/19 2257 03/06/19 0327 03/06/19 0405 04-04-19 0215  WBC 11.0* 15.2* 17.8*  --  22.2*  --  18.1*  HGB 9.4* 9.6* 9.6* 11.9* 9.9* 11.6* 9.4*  HCT 30.8* 31.7* 30.8* 35.0* 33.3* 34.0* 31.2*  MCV 83.7 83.0 82.1  --  84.9  --  83.4  PLT 280 274 286  --  318  --  [id number]    Basic Metabolic Panel: Recent Labs  Lab 03/03/19 0351 03/04/19 0038 03/04/19 0308 03/05/19 0447 03/05/19 2257 03/06/19 0327 03/06/19 0405  03/06/19 1335 2019-03-12 0215  NA 132* 132*  --  133* 132* 135 132* 133* 132*  K 4.1 4.2  --  3.6 5.2* 5.4* 5.2* 4.8 4.4  CL 85* 86*  --  88*  --  89*  --  88* 89*  CO2 32 33*  --  33*  --  30  --  28 29  GLUCOSE 147* 279*  --  216*  --  117*  --  180* 120*  BUN 91* 95*  --  92*  --  110*  --  115* 124*  CREATININE 1.69* 1.57*  --  1.45*  --  2.21*  --  2.41* 2.77*  CALCIUM 9.0 8.7*  --  8.7*  --  8.8*  --  8.6* 8.7*  MG 2.3  --  1.9 2.2  --  2.3  --   --  2.3  PHOS 4.1  --  3.1 2.6  --  5.1*  --   --  6.5*   GFR: Estimated Creatinine Clearance:  21.4 mL/min (A) (by C-G formula based on SCr of 2.77 mg/dL (H)). Recent Labs  Lab 03/01/19 0943 03/02/19 0421 03/03/19 0351 03/03/19 0400 03/04/19 0308 03/04/19 0909 03/05/19 0447 03/06/19 0327 03-12-19 0215  PROCALCITON 0.45 0.49 0.49  --   --  0.21  --   --   --   WBC  --  10.6* 11.0*  --  15.2*  --  17.8* 22.2* 18.1*  LATICACIDVEN  --  1.3  --  1.1  --   --   --   --   --     Liver Function Tests: Recent Labs  Lab 03/01/19 0358 03/02/19 0421 03/03/19 0351 03/04/19 0308 03/12/19 0215  AST 105* 64* 66* 69* 131*  ALT 355* 201* 140* 111* 105*  ALKPHOS 138* 123 127* 152* 140*  BILITOT 1.1 1.1 0.8 0.7 1.0  PROT 6.2* 5.7* 6.0* 5.8* 6.0*  ALBUMIN 2.5* 2.1* 2.0* 1.8* 1.8*   No results for input(s): LIPASE, AMYLASE in the last 168 hours. No results for input(s): AMMONIA in the last 168 hours.  ABG    Component Value Date/Time   PHART 7.357 03/06/2019 0405   PCO2ART 60.9 (H) 03/06/2019 0405   PO2ART 80.0 (L) 03/06/2019 0405   HCO3 34.2 (H) 03/06/2019 0405   TCO2 36 (H) 03/06/2019 0405   ACIDBASEDEF 5.0 (H) 02/08/2019 1706   O2SAT 78.4 03/12/19 0356     Coagulation Profile: Recent Labs  Lab 03/02/19 0421  INR 1.8*    Cardiac Enzymes: No results for input(s): CKTOTAL, CKMB, CKMBINDEX, TROPONINI in the last 168 hours.  HbA1C: Hemoglobin A1C  Date/Time Value Ref Range Status  02/13/2019 01:10 PM 7.3 (A) 4.0 - 5.6 % Final   Hgb A1c MFr Bld  Date/Time Value Ref Range Status  02/12/2019 07:54 PM 8.1 (H) 4.8 - 5.6 % Final    Comment:    (NOTE) Pre diabetes:          5.7%-6.4% Diabetes:              >6.4% Glycemic control for   <7.0% adults with diabetes   10/18/2018 11:35 AM 6.6 (H) 4.8 - 5.6 % Final    Comment:             Prediabetes: 5.7 - 6.4          Diabetes: >6.4          Glycemic control for adults with diabetes: <7.0     CBG:  Recent Labs  Lab 03/06/19 1208 03/06/19 1637 03/06/19 2032 Mar 24, 2019 0037 2019/03/24 0418  GLUCAP 153* 183* 149*  138* 112*     Critical care time: 35 mins   The patient is critically ill with multiple organ system failure and requires high complexity decision making for assessment and support, frequent evaluation and titration of therapies, advanced monitoring, review of radiographic studies and interpretation of complex data.   Critical Care Time devoted to patient care services, exclusive of separately billable procedures, described in this note is 35 minutes.   Marshell Garfinkel MD [facility] Pager [phone] If no answer call [phone] 2019-03-24, 8:23 AM

## 2019-03-11 NOTE — Progress Notes (Signed)
[facility] for Heparin  Indication: atrial fibrillation  Allergies  Allergen Reactions  . Contrast Media [Iodinated Diagnostic Agents] Hives and Other (See Comments)    Spoke to patient, Iodine allergy is really IV contrast allergy.   . Novolin R [Insulin] Shortness Of Breath    Porcine insulin, Regular (per pt's daughter)  . Codeine Nausea And Vomiting and Other (See Comments)    HIGH DOSES-SEVERE VOMITING  . Iodine Other (See Comments)    MUST HAVE BENADRYL PRIOR TO PROCEDURE AND RIGHT BEFORE TREATMENT TO COUNTERACT REACTION-BLISTERING REACTION DERMATOLOGICAL  . Penicillins Itching, Rash and Other (See Comments)    Has patient had a PCN reaction causing immediate rash, facial/tongue/throat swelling, SOB or lightheadedness with hypotension: no Has patient had a PCN reaction causing severe rash involving mucus membranes or skin necrosis: No Has patient had a PCN reaction that required hospitalization No Has patient had a PCN reaction occurring within the last 10 years: No If all of the above answers are "NO", then may proceed with Cephalosporin use.  CHEST SIZED RASH AND ITCHING   . Ace Inhibitors Cough  . Demerol [Meperidine] Nausea And Vomiting  . Dilaudid [Hydromorphone Hcl] Other (See Comments)    HEADACHE   . Neosporin [Neomycin-Bacitracin Zn-Polymyx] Itching, Rash and Other (See Comments)    MAKES REACTIONS WORSE WHEN USING AS PROPHYLACTIC  . Percocet [Oxycodone-Acetaminophen] Rash  . Tape Itching and Rash    Patient Measurements: Height: 5\' 4"  (162.6 cm) Weight: 192 lb 0.3 oz (87.1 kg) IBW/kg (Calculated) : 54.7 Heparin Dosing Weight: 75kg  Vital Signs: Temp: 98.3 F (36.8 C) (10/28 0424) Temp Source: Oral (10/28 0424) BP: 124/88 (10/28 0700) Pulse Rate: 122 (10/28 0700)  Labs: Recent Labs    03/05/19 0447 03/05/19 0448  03/06/19 0327 03/06/19 0405 03/06/19 1335 2019/03/30 0215  HGB 9.6*  --    < > 9.9* 11.6*  --  9.4*   HCT 30.8*  --    < > 33.3* 34.0*  --  31.2*  PLT 286  --   --  318  --   --  329  APTT 68*  --   --  86*  --   --  137*  HEPARINUNFRC  --  0.64  --  0.86*  --   --  0.77*  CREATININE 1.45*  --   --  2.21*  --  2.41* 2.77*   < > = values in this interval not displayed.    Estimated Creatinine Clearance: 21.4 mL/min (A) (by C-G formula based on SCr of 2.77 mg/dL (H)).  Assessment: 68yof admitted for SOB in setting of Afib RVR was supposed to have DCCV but deferred because patient missed doses of apixaban prior. She developed hypotension, cardiogenic shock. Previously on apixaban last dose 10/17 pm.  aPTT= 137sec this morning - repeated and still elevated at 120sec, heparin level now correlating to aPTT at 0.77, hgb stable in 9s, plt normal. No bleeding issues noted. Heparin infusing through [facility], labs drawn from [facility] - confirmed w/ RN.  Failed repeat dccv 10/23 and 10/25, back in afib.   Goal of Therapy:  Heparin level 0.3-0.7 aPTT aptt 66-90 sec seconds Monitor platelets by anticoagulation protocol: Yes   Plan:  -Decrease heparin to 1450 units/hr -Recheck 8 hr heparin level after rate decrease  -Daily heparin level, CBC  Vertis Kelch, PharmD PGY2 Cardiology Pharmacy Resident Phone [phone] 03-30-2019       7:51 AM  Please check [email or web address] for  unit-specific pharmacist phone numbers

## 2019-03-11 NOTE — Progress Notes (Signed)
Wasted 265ml of fentanyl in Stericycle with [employer] RN

## 2019-03-11 DEATH — deceased

## 2019-03-12 ENCOUNTER — Telehealth: Payer: Self-pay | Admitting: *Deleted

## 2019-03-12 ENCOUNTER — Ambulatory Visit: Payer: Medicare PPO | Admitting: Gastroenterology

## 2019-03-12 NOTE — Telephone Encounter (Signed)
Received original D/C-D/C forwarded to Dr,Mannam fir signature. (4N)

## 2019-03-14 NOTE — Telephone Encounter (Signed)
Received original signed D/C, [facility] home notified for pick up.  

## 2019-03-15 ENCOUNTER — Ambulatory Visit: Payer: Medicare PPO | Admitting: Physician Assistant

## 2019-04-04 ENCOUNTER — Ambulatory Visit: Payer: Medicare PPO | Admitting: Adult Health

## 2019-04-10 NOTE — Discharge Summary (Signed)
Physician Death Summary  Patient ID: RHENA OLIVERSON MRN: [id number] DOB/AGE: 15-Dec-1950 68 y.o.  Admit date: 02/11/2019 Discharge date: Apr 06, 2019  Admission Diagnoses: Respiratory failure  Discharge Diagnoses:  Respiratory failure Pulmonary edema Cardiogenic shock Atrial fibrillation with rapid ventricular rate Pneumonia with MRSA bacteremia, Citrobacter Diabetes mellitus Thyrotoxicosis  Discharged Condition: Deceased  Hospital Course:  68 year old female presents to ED on 10/16 with chest pain and shortness of breath. Was scheduled for cardioversion on 10/12 but that was canceled since patient had missed her Eliquis dosage for 3 consecutive days prior to that. Was found to be in A.Fib RVR. CXR with Cardiomegaly with no infiltrate. Taken for Cardioversion on 10/17. Required intubated at this time due to respiratory distress presumed secondary to cardiogenic shock. PCCM asked to consult.  Stay complicated by ongoing cardiogenic shock. Self Extubated 10/22. However required immediate reintubation.   Remains on the ventilator with worsening hemodynamics, cardiogenic shock requiring pressor, inotrope support.  She was cardioverted in the ICU but immediately went back into atrial fibrillation.  She also developed pneumonia with MRSA, Citrobacter for which she was treated with antibiotics. We had multiple goals of care conversation with family and palliative care service was involved.  Per family patient had poor quality of life prior to admission and did not want to be maintained on life support like this.  Requested transition to comfort care.  She was terminally extubated on 04/06/2023 and passed away shortly thereafter.  Signed: Berkley Wrightsman 03/13/2019, 5:22 PM

## 2019-05-08 ENCOUNTER — Ambulatory Visit: Payer: Medicare PPO | Admitting: Podiatry

## 2019-05-16 ENCOUNTER — Ambulatory Visit: Payer: Medicare PPO | Admitting: Endocrinology

## 2020-10-04 IMAGING — MR MR SHOULDER*R* W/O CM
4 of 9 series · 18 of 40 positions shown · non-contrast
Comparison: None.

CLINICAL DATA: Chronic right shoulder pain. Rotator cuff repair 20
years ago.

EXAM:
MRI OF THE RIGHT SHOULDER WITHOUT CONTRAST
TECHNIQUE: Multiplanar, multisequence MR imaging of the shoulder was performed.
No intravenous contrast was administered.

[Series 3: PD · axial · 4.0mm · 0.27mm/px · z∈[+10,+112]mm · 6 of 27 slices shown (1 of 2)]
[im 1/27]
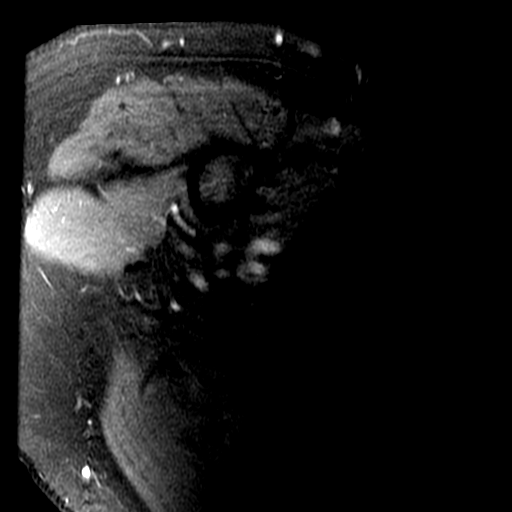
[im 6/27]
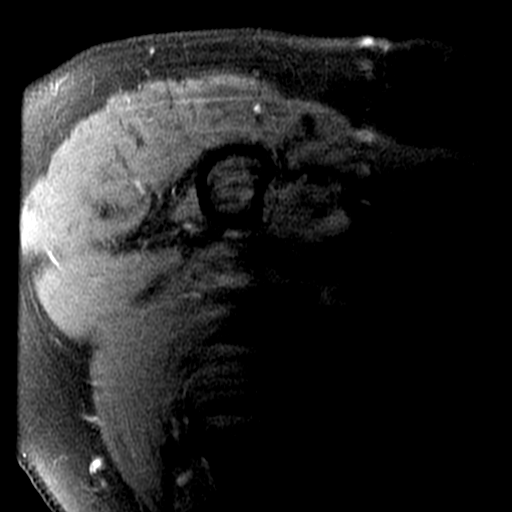
[im 11/27]
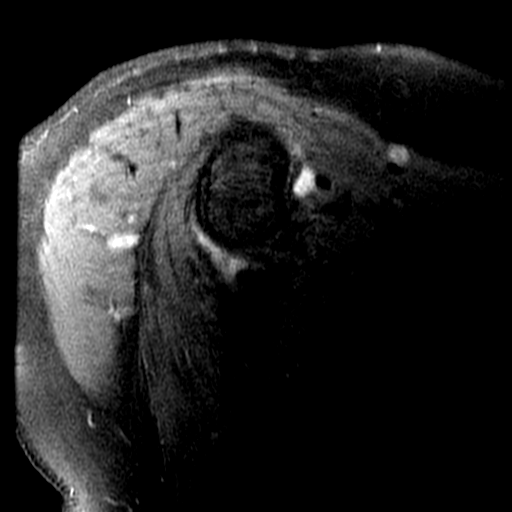
[im 16/27]
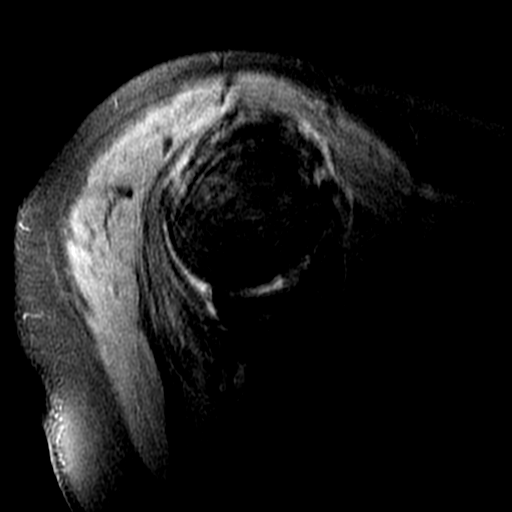
[im 21/27]
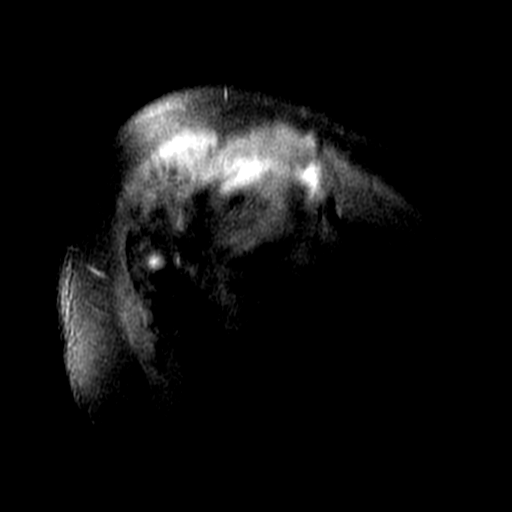
[im 27/27]
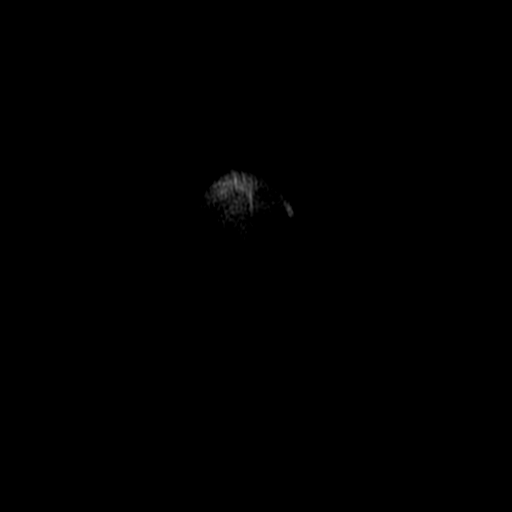

[Series 6: PD fat-sat · oblique · 4.0mm · 0.27mm/px · 4 of 20 slices shown (1 of 2)]
[im 1/20]
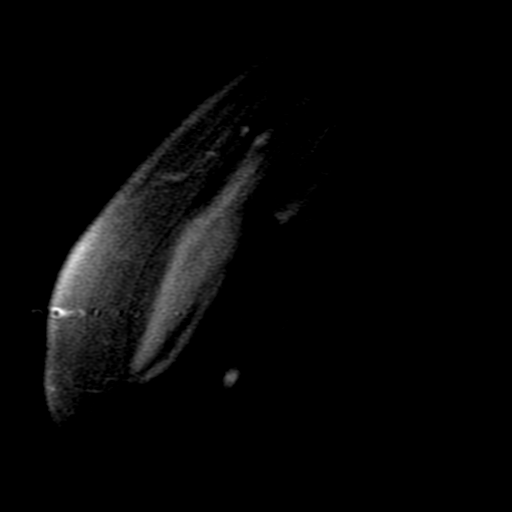
[im 7/20]
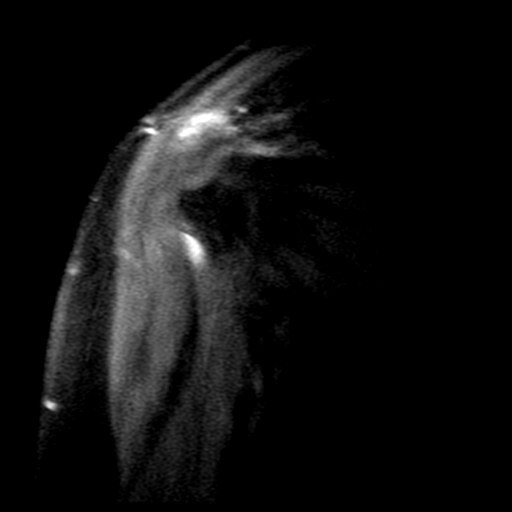
[im 13/20]
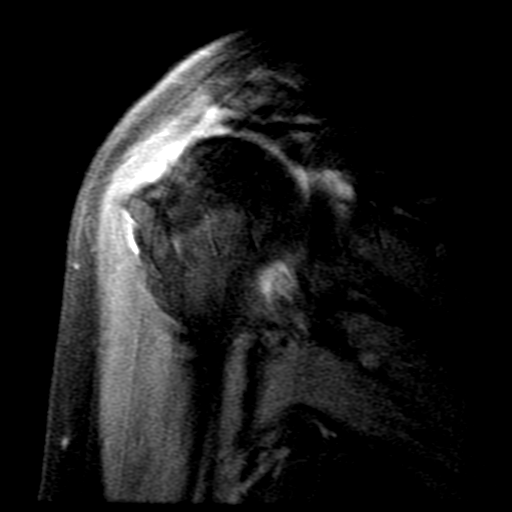
[im 20/20]
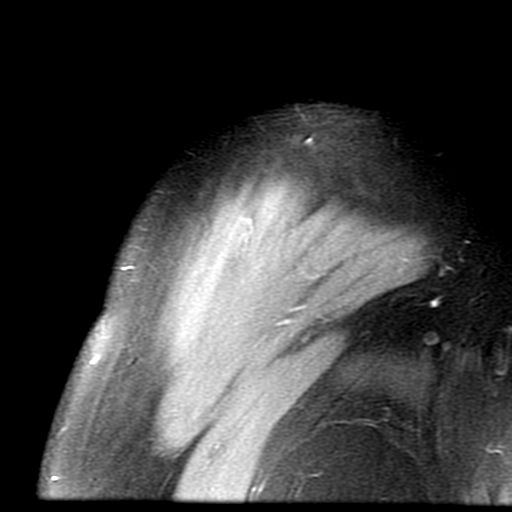

[Series 7: PD · axial · 4.0mm · 0.27mm/px · z∈[+6,+110]mm · 4 of 27 slices shown (2 of 2)]
[im 1/27]
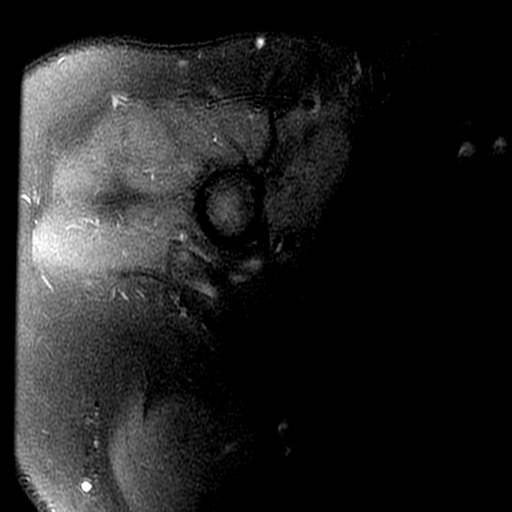
[im 7/27]
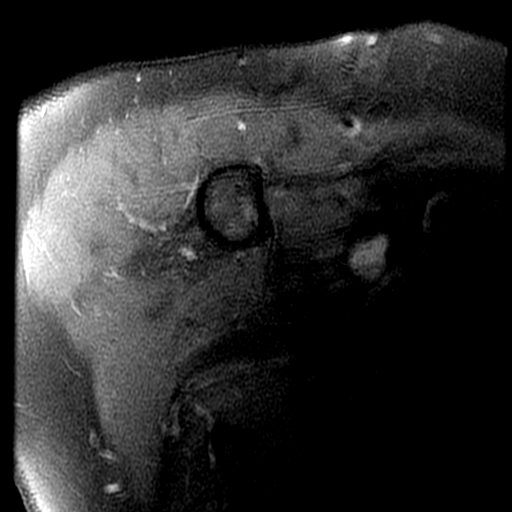
[im 14/27]
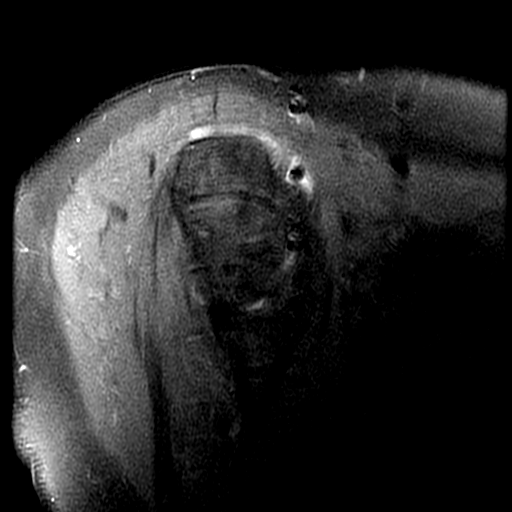
[im 27/27]
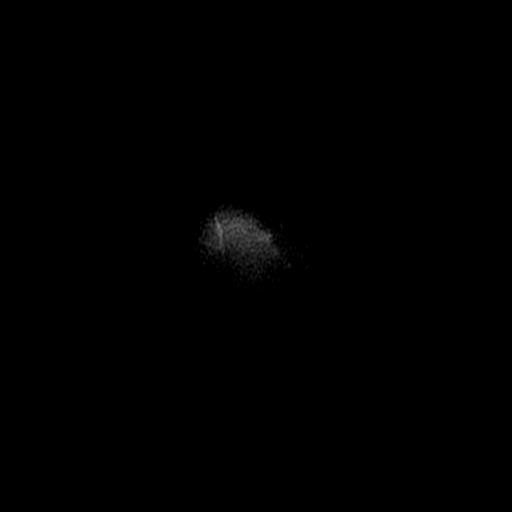

[Series 12: PD fat-sat · oblique · 4.0mm · 0.27mm/px · 4 of 20 slices shown (2 of 2)]
[im 1/20]
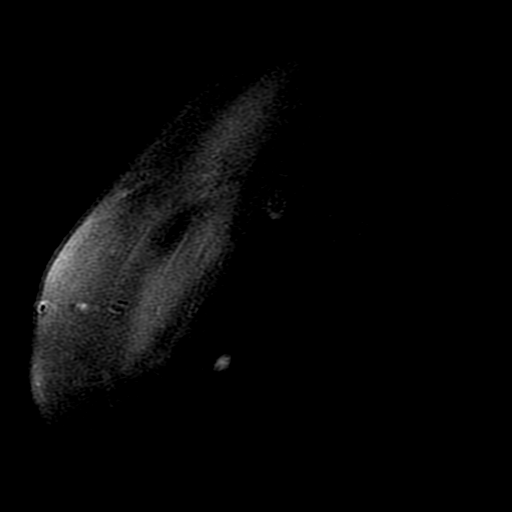
[im 7/20]
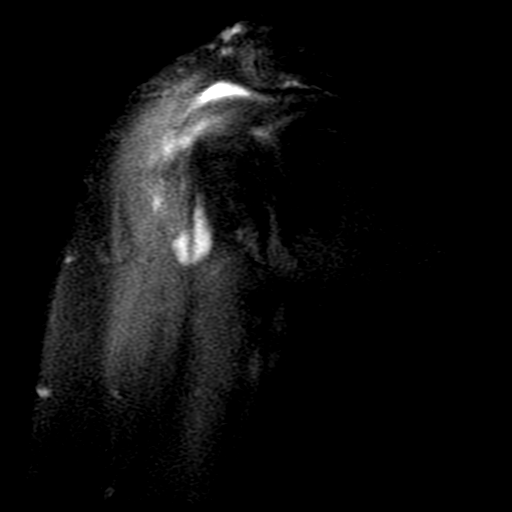
[im 13/20]
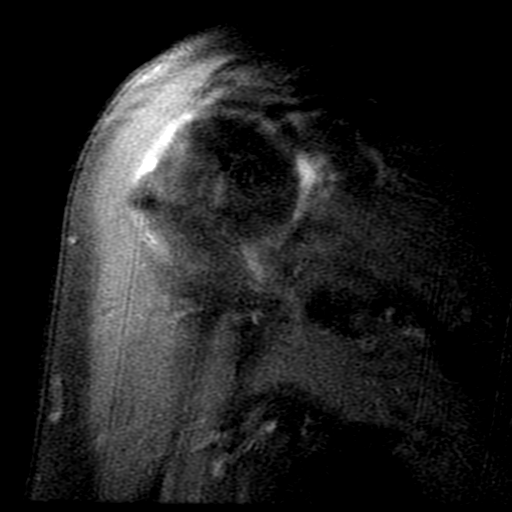
[im 20/20]
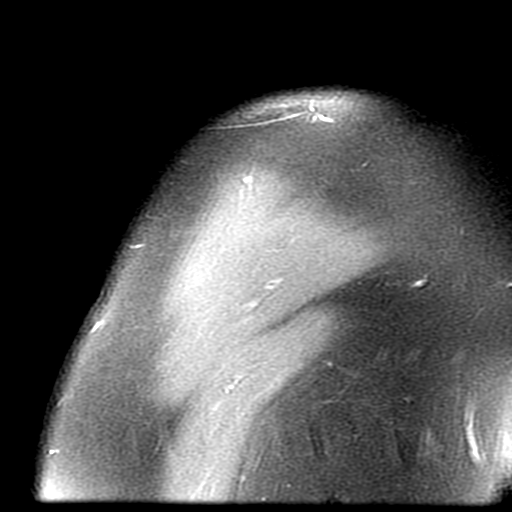

[18 of 40 positions shown; findings below may reference images not displayed]

FINDINGS: Despite efforts by the technologist and patient, severe motion
artifact is present on today's exam and could not be eliminated.
Numerous series were repeated, but were also affected by motion
artifact. This reduces exam sensitivity and specificity.

Rotator cuff: Full-thickness full width tear of the supraspinatus
tendon, the tendon is retracted up to 2.5 cm

Full-thickness, full width tear of the infraspinatus tendon
retracted 2.0 cm. Suspected small ossifications in the infraspinatus
tendon, image [DATE].

Mild subscapularis and teres minor tendinopathy.

Muscles:  Mild infraspinatus and teres minor atrophy

Biceps long head:  Mild tendinopathy of the intra-articular segment.

Acromioclavicular Joint: Moderate spurring. Fluid signal in the
joint. Small degenerative subcortical cystic lesions. There is a
linear low T1 signal and high T2 signal band in the acromion
potentially from a small os acromiale or a healing acromial
fracture. Type I acromion. As expected there is a small amount of
fluid in the subacromial subdeltoid bursa.

Glenohumeral Joint: Mild spurring of the humeral head. No overt
joint effusion.

Labrum:  Indeterminate due to motion artifact.

Bones:  Unremarkable

Other: No supplemental non-categorized findings.
IMPRESSION: 1. Full-thickness full width tear of the supraspinatus tendon
retracted up to 2.5 cm.
2. Full-thickness full width tear of the infraspinatus tendon
retracted 2.0 cm.
3. Mild subscapularis and teres minor tendinopathy.
4. Mild infraspinatus and teres minor atrophy. The infraspinatus
atrophy could well be due to the rotator cuff rupture. Teres minor
atrophy is of less certain cause, but could be due to low-level
chronic quadrilateral space syndrome.
5. Moderate degenerative AC joint arthropathy.
6. There is a small band of low T1 and accentuated T2 signal
traversing the acromion and best seen for example on image [DATE].
This would be an unusual appearance for os acromiale although could
conceivably be an anterior os acromiale. This could also be an
acromial healing fracture or stress fracture.
7. Despite efforts by the technologist and patient, motion artifact
is present on today's exam and could not be eliminated. This reduces
exam sensitivity and specificity.
